# Patient Record
Sex: Female | Born: 1935
Health system: Southern US, Community
[De-identification: ages and names within clinical notes are randomized; demographics above are authoritative.]

## PROBLEM LIST (undated history)

## (undated) ENCOUNTER — Emergency Department (HOSPITAL_COMMUNITY): Payer: Medicare Other

## (undated) DIAGNOSIS — N200 Calculus of kidney: Secondary | ICD-10-CM

## (undated) DIAGNOSIS — D649 Anemia, unspecified: Secondary | ICD-10-CM

## (undated) DIAGNOSIS — M25569 Pain in unspecified knee: Secondary | ICD-10-CM

## (undated) DIAGNOSIS — I10 Essential (primary) hypertension: Secondary | ICD-10-CM

## (undated) DIAGNOSIS — M542 Cervicalgia: Secondary | ICD-10-CM

## (undated) DIAGNOSIS — R221 Localized swelling, mass and lump, neck: Secondary | ICD-10-CM

## (undated) DIAGNOSIS — I714 Abdominal aortic aneurysm, without rupture, unspecified: Secondary | ICD-10-CM

## (undated) DIAGNOSIS — C189 Malignant neoplasm of colon, unspecified: Secondary | ICD-10-CM

## (undated) DIAGNOSIS — E039 Hypothyroidism, unspecified: Secondary | ICD-10-CM

## (undated) DIAGNOSIS — K625 Hemorrhage of anus and rectum: Secondary | ICD-10-CM

## (undated) DIAGNOSIS — K219 Gastro-esophageal reflux disease without esophagitis: Secondary | ICD-10-CM

## (undated) DIAGNOSIS — N189 Chronic kidney disease, unspecified: Secondary | ICD-10-CM

## (undated) DIAGNOSIS — R42 Dizziness and giddiness: Secondary | ICD-10-CM

## (undated) DIAGNOSIS — I4891 Unspecified atrial fibrillation: Secondary | ICD-10-CM

## (undated) DIAGNOSIS — E162 Hypoglycemia, unspecified: Secondary | ICD-10-CM

## (undated) DIAGNOSIS — I5022 Chronic systolic (congestive) heart failure: Secondary | ICD-10-CM

## (undated) DIAGNOSIS — I498 Other specified cardiac arrhythmias: Secondary | ICD-10-CM

## (undated) DIAGNOSIS — E119 Type 2 diabetes mellitus without complications: Secondary | ICD-10-CM

## (undated) DIAGNOSIS — K565 Intestinal adhesions [bands], unspecified as to partial versus complete obstruction: Secondary | ICD-10-CM

## (undated) DIAGNOSIS — R809 Proteinuria, unspecified: Secondary | ICD-10-CM

## (undated) DIAGNOSIS — E878 Other disorders of electrolyte and fluid balance, not elsewhere classified: Secondary | ICD-10-CM

## (undated) DIAGNOSIS — R0602 Shortness of breath: Secondary | ICD-10-CM

## (undated) DIAGNOSIS — E7800 Pure hypercholesterolemia, unspecified: Secondary | ICD-10-CM

## (undated) DIAGNOSIS — I639 Cerebral infarction, unspecified: Secondary | ICD-10-CM

## (undated) DIAGNOSIS — I712 Thoracic aortic aneurysm, without rupture, unspecified: Secondary | ICD-10-CM

## (undated) DIAGNOSIS — F329 Major depressive disorder, single episode, unspecified: Secondary | ICD-10-CM

## (undated) DIAGNOSIS — M1990 Unspecified osteoarthritis, unspecified site: Secondary | ICD-10-CM

## (undated) DIAGNOSIS — K832 Perforation of bile duct: Secondary | ICD-10-CM

## (undated) DIAGNOSIS — R220 Localized swelling, mass and lump, head: Secondary | ICD-10-CM

## (undated) DIAGNOSIS — E876 Hypokalemia: Secondary | ICD-10-CM

## (undated) DIAGNOSIS — F419 Anxiety disorder, unspecified: Secondary | ICD-10-CM

## (undated) DIAGNOSIS — M109 Gout, unspecified: Secondary | ICD-10-CM

## (undated) DIAGNOSIS — F32A Depression, unspecified: Secondary | ICD-10-CM

## (undated) DIAGNOSIS — K8020 Calculus of gallbladder without cholecystitis without obstruction: Secondary | ICD-10-CM

## (undated) DIAGNOSIS — H1045 Other chronic allergic conjunctivitis: Secondary | ICD-10-CM

## (undated) HISTORY — DX: Cervicalgia: M54.2

## (undated) HISTORY — DX: Cerebral infarction, unspecified: I63.9

## (undated) HISTORY — DX: Major depressive disorder, single episode, unspecified: F32.9

## (undated) HISTORY — DX: Calculus of gallbladder without cholecystitis without obstruction: K80.20

## (undated) HISTORY — DX: Dizziness and giddiness: R42

## (undated) HISTORY — DX: Localized swelling, mass and lump, neck: R22.1

## (undated) HISTORY — DX: Shortness of breath: R06.02

## (undated) HISTORY — DX: Hypoglycemia, unspecified: E16.2

## (undated) HISTORY — DX: Chronic systolic (congestive) heart failure: I50.22

## (undated) HISTORY — DX: Perforation of bile duct: K83.2

## (undated) HISTORY — DX: Other chronic allergic conjunctivitis: H10.45

## (undated) HISTORY — DX: Pain in unspecified knee: M25.569

## (undated) HISTORY — DX: Thoracic aortic aneurysm, without rupture: I71.2

## (undated) HISTORY — DX: Gastro-esophageal reflux disease without esophagitis: K21.9

## (undated) HISTORY — DX: Hypokalemia: E87.6

## (undated) HISTORY — DX: Anxiety disorder, unspecified: F41.9

## (undated) HISTORY — DX: Thoracic aortic aneurysm, without rupture, unspecified: I71.20

## (undated) HISTORY — DX: Malignant neoplasm of colon, unspecified: C18.9

## (undated) HISTORY — DX: Depression, unspecified: F32.A

## (undated) HISTORY — DX: Other specified cardiac arrhythmias: I49.8

## (undated) HISTORY — DX: Localized swelling, mass and lump, head: R22.0

## (undated) HISTORY — DX: Proteinuria, unspecified: R80.9

## (undated) HISTORY — DX: Gout, unspecified: M10.9

## (undated) HISTORY — DX: Type 2 diabetes mellitus without complications: E11.9

## (undated) HISTORY — PX: KIDNEY STONE SURGERY: SHX686

## (undated) HISTORY — DX: Chronic kidney disease, unspecified: N18.9

## (undated) HISTORY — DX: Other disorders of electrolyte and fluid balance, not elsewhere classified: E87.8

## (undated) HISTORY — DX: Anemia, unspecified: D64.9

## (undated) HISTORY — DX: Hemorrhage of anus and rectum: K62.5

---

## 1997-07-23 DIAGNOSIS — C189 Malignant neoplasm of colon, unspecified: Secondary | ICD-10-CM

## 1997-07-23 HISTORY — DX: Malignant neoplasm of colon, unspecified: C18.9

## 1997-08-11 HISTORY — PX: HEMICOLECTOMY: SHX854

## 1998-06-29 ENCOUNTER — Encounter: Payer: Self-pay | Admitting: Gastroenterology

## 1998-06-29 ENCOUNTER — Ambulatory Visit (HOSPITAL_COMMUNITY): Admission: RE | Admit: 1998-06-29 | Discharge: 1998-06-29 | Payer: Self-pay | Admitting: Gastroenterology

## 1998-10-24 ENCOUNTER — Ambulatory Visit (HOSPITAL_COMMUNITY): Admission: RE | Admit: 1998-10-24 | Discharge: 1998-10-24 | Payer: Self-pay | Admitting: Gastroenterology

## 1999-02-22 ENCOUNTER — Ambulatory Visit (HOSPITAL_COMMUNITY): Admission: RE | Admit: 1999-02-22 | Discharge: 1999-02-22 | Payer: Self-pay | Admitting: Cardiology

## 1999-02-22 ENCOUNTER — Encounter: Payer: Self-pay | Admitting: Cardiology

## 1999-04-17 ENCOUNTER — Encounter: Admission: RE | Admit: 1999-04-17 | Discharge: 1999-07-16 | Payer: Self-pay | Admitting: Family Medicine

## 2000-07-18 ENCOUNTER — Encounter: Payer: Self-pay | Admitting: *Deleted

## 2000-07-18 ENCOUNTER — Ambulatory Visit (HOSPITAL_COMMUNITY): Admission: RE | Admit: 2000-07-18 | Discharge: 2000-07-18 | Payer: Self-pay | Admitting: *Deleted

## 2000-10-07 ENCOUNTER — Encounter: Admission: RE | Admit: 2000-10-07 | Discharge: 2001-01-05 | Payer: Self-pay | Admitting: Family Medicine

## 2001-02-12 ENCOUNTER — Encounter: Admission: RE | Admit: 2001-02-12 | Discharge: 2001-05-13 | Payer: Self-pay | Admitting: Family Medicine

## 2001-04-29 ENCOUNTER — Encounter (HOSPITAL_COMMUNITY): Payer: Self-pay | Admitting: Oncology

## 2001-04-29 ENCOUNTER — Ambulatory Visit (HOSPITAL_COMMUNITY): Admission: RE | Admit: 2001-04-29 | Discharge: 2001-04-29 | Payer: Self-pay | Admitting: Oncology

## 2001-06-21 ENCOUNTER — Inpatient Hospital Stay (HOSPITAL_COMMUNITY): Admission: EM | Admit: 2001-06-21 | Discharge: 2001-06-22 | Payer: Self-pay

## 2001-09-22 ENCOUNTER — Encounter: Payer: Self-pay | Admitting: Family Medicine

## 2001-09-22 ENCOUNTER — Encounter: Admission: RE | Admit: 2001-09-22 | Discharge: 2001-09-22 | Payer: Self-pay | Admitting: Family Medicine

## 2001-10-01 ENCOUNTER — Ambulatory Visit (HOSPITAL_COMMUNITY): Admission: RE | Admit: 2001-10-01 | Discharge: 2001-10-01 | Payer: Self-pay | Admitting: Family Medicine

## 2002-04-30 ENCOUNTER — Encounter: Admission: RE | Admit: 2002-04-30 | Discharge: 2002-04-30 | Payer: Self-pay | Admitting: Cardiothoracic Surgery

## 2002-04-30 ENCOUNTER — Encounter: Payer: Self-pay | Admitting: Cardiothoracic Surgery

## 2002-05-12 ENCOUNTER — Encounter (HOSPITAL_BASED_OUTPATIENT_CLINIC_OR_DEPARTMENT_OTHER): Admission: RE | Admit: 2002-05-12 | Discharge: 2002-08-10 | Payer: Self-pay | Admitting: Internal Medicine

## 2002-07-27 ENCOUNTER — Encounter: Admission: RE | Admit: 2002-07-27 | Discharge: 2002-10-25 | Payer: Self-pay | Admitting: Family Medicine

## 2002-11-24 ENCOUNTER — Encounter: Admission: RE | Admit: 2002-11-24 | Discharge: 2003-02-22 | Payer: Self-pay | Admitting: Family Medicine

## 2003-04-29 ENCOUNTER — Encounter: Admission: RE | Admit: 2003-04-29 | Discharge: 2003-04-29 | Payer: Self-pay | Admitting: Cardiothoracic Surgery

## 2003-04-29 ENCOUNTER — Encounter: Payer: Self-pay | Admitting: Cardiothoracic Surgery

## 2003-05-11 ENCOUNTER — Encounter: Payer: Self-pay | Admitting: Family Medicine

## 2003-05-11 ENCOUNTER — Encounter: Admission: RE | Admit: 2003-05-11 | Discharge: 2003-05-11 | Payer: Self-pay | Admitting: Family Medicine

## 2003-09-15 ENCOUNTER — Encounter: Admission: RE | Admit: 2003-09-15 | Discharge: 2003-09-15 | Payer: Self-pay | Admitting: Family Medicine

## 2003-12-28 ENCOUNTER — Emergency Department (HOSPITAL_COMMUNITY): Admission: EM | Admit: 2003-12-28 | Discharge: 2003-12-28 | Payer: Self-pay | Admitting: Family Medicine

## 2004-05-04 ENCOUNTER — Encounter: Admission: RE | Admit: 2004-05-04 | Discharge: 2004-05-04 | Payer: Self-pay | Admitting: Cardiothoracic Surgery

## 2004-07-21 ENCOUNTER — Inpatient Hospital Stay (HOSPITAL_COMMUNITY): Admission: EM | Admit: 2004-07-21 | Discharge: 2004-07-22 | Payer: Self-pay | Admitting: Emergency Medicine

## 2004-10-30 ENCOUNTER — Encounter: Admission: RE | Admit: 2004-10-30 | Discharge: 2004-10-30 | Payer: Self-pay | Admitting: Family Medicine

## 2005-03-16 ENCOUNTER — Ambulatory Visit: Payer: Self-pay | Admitting: Internal Medicine

## 2005-03-19 ENCOUNTER — Ambulatory Visit: Payer: Self-pay | Admitting: Cardiology

## 2005-03-19 IMAGING — CT CT CHEST W/ CM
1 of 5 series · 15 of 30 positions shown, 19 images · IV contrast (OMNIPAQUE 100)
Comparison: none

FINDINGS
CLINICAL DATA: FOLLOW UP ASCENDING THORACIC AORTIC ANEURYSM.  (CON: NONE)
CT OF CHEST WITH CONTRAST:
MULTIDETECTOR HELICAL SCANS THROUGH THE CHEST WERE PERFORMED AFTER IV CONTRAST MEDIA WAS GIVEN.
100 CC OF OMNIPAQUE 300 WERE GIVEN AS THE CONTRAST MEDIA.  THIS SCAN IS COMPARED TO THE PRIOR CT OF
04/30/02.
THERE IS LITTLE CHANGE IN THE FUSIFORM DILATATION OF THE ASCENDING AORTA.  ON IMAGE NUMBER 89, THE
ASCENDING AORTIC FUSIFORMLY DILATED ANEURYSM MEASURES 4.7 X 4.9 CM COMPARED TO 4.6 X 4.8 CM ON THE
PRIOR STUDY.  THE ORIGIN OF THE RIGHT SUBCLAVIAN ARTERY IS ALSO PROMINENT AS IT EMANATES FROM THE
AORTIC ARCH, AND THIS IS UNCHANGED AS WELL.  THE ORIGINS OF THE LEFT SUBCLAVIAN AND LEFT COMMON
CAROTID APPEAR NORMAL.  THE PULMONARY ARTERIES OPACIFY WELL WITH NO ABNORMALITY.  NO MEDIASTINAL OR
HILAR ADENOPATHY IS SEEN, WITH A FEW SMALL PRECARINAL NODES REMAINING STABLE.  INCIDENTAL SMALL
GALLSTONES AGAIN ARE NOTED LAYERING IN THE GALLBLADDER.
ON LUNG WINDOW IMAGES, NO LUNG PARENCHYMAL LESION IS SEEN.  NO EFFUSION IS NOTED.
IMPRESSION
STABLE FUSIFORM DILATATION OF ASCENDING AORTA CURRENTLY MEASURING 4.7 X 4.9 CM COMPARED TO 4.6 X
4.8 CM ON THE STUDY OF 04/30/02.  NO CHANGE IN DILATED ORIGIN OF THE RIGHT SUBCLAVIAN ARTERY AS
WELL.  NO MEDIASTINAL OR HILAR ADENOPATHY IS SEEN.
CT MULTIPLANAR RECONSTRUCTION
MULTIPLANAR REFORMATTED CT IMAGES WERE RECONSTRUCTED FROM THE AXIAL CT DATA SET.  THESE IMAGES WERE
REVIEWED, AND PERTINENT FINDINGS ARE INCLUDED IN THE ACCOMPANYING COMPLETE CT REPORT.
SEE COMPLETE CT REPORT.

[Series 4: recon 3: · axial · 0.70mm/px · z∈[-246,+17]mm · 15 of 245 slices shown, 19 images]
[im 17/245  mediastinal]
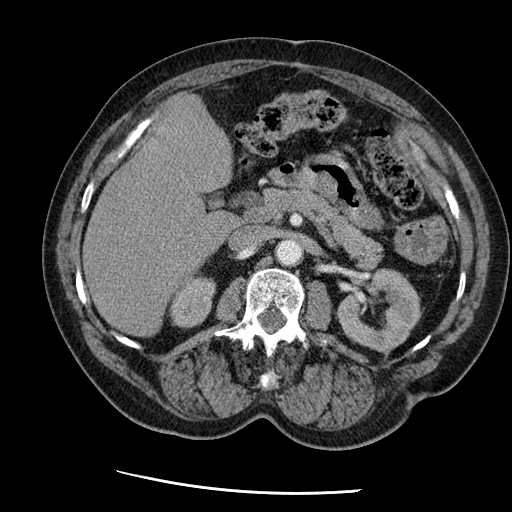
[im 17/245  lung]
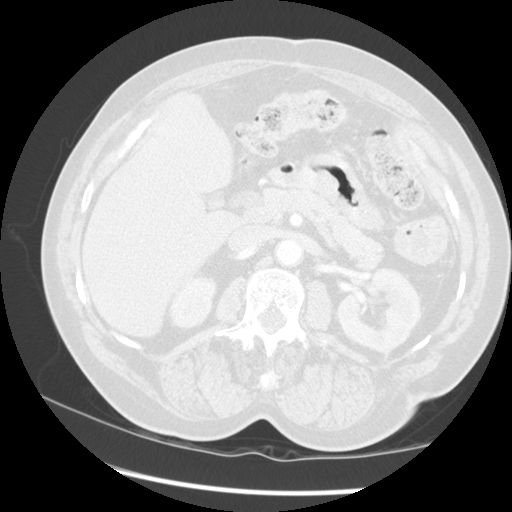
[im 33/245  lung]
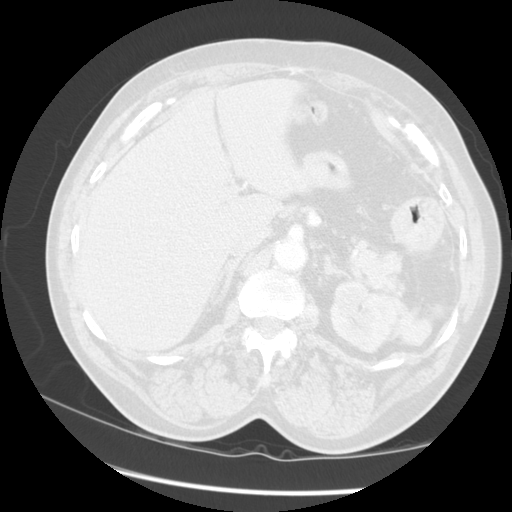
[im 49/245  lung]
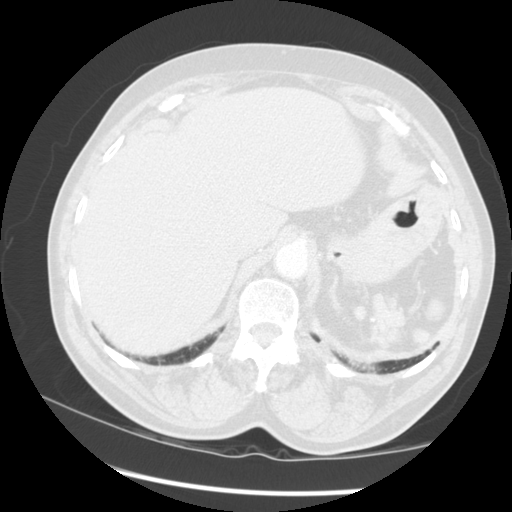
[im 66/245  lung]
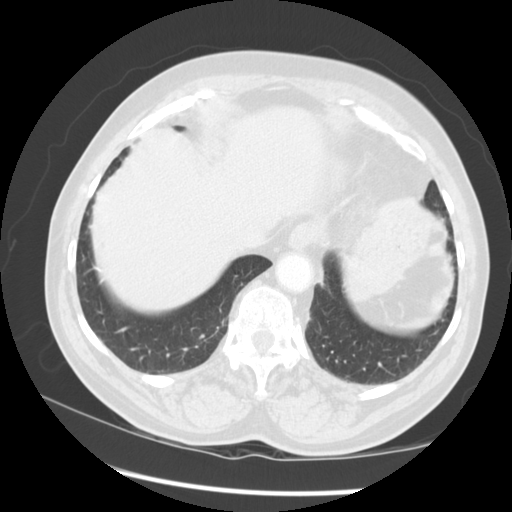
[im 82/245  mediastinal]
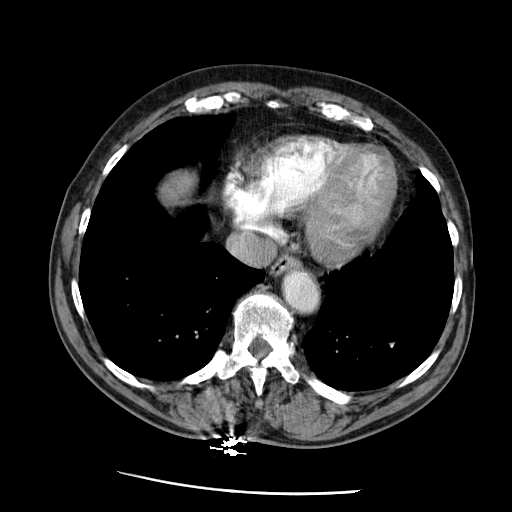
[im 82/245  lung]
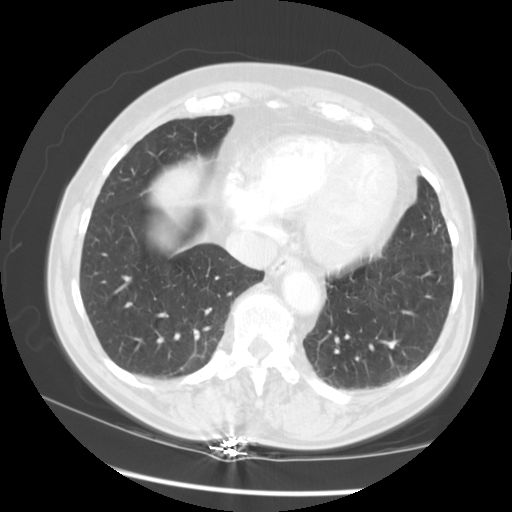
[im 98/245  lung]
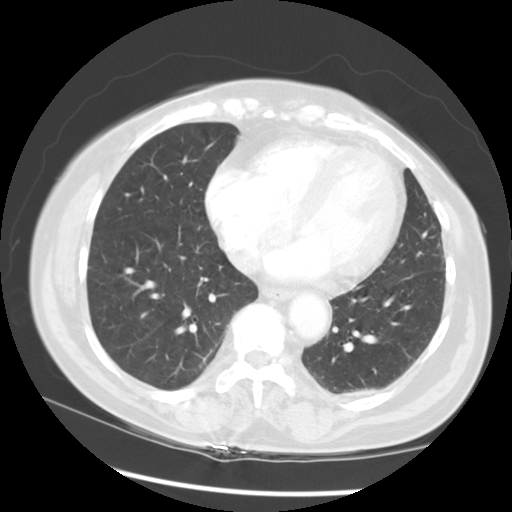
[im 114/245  lung]
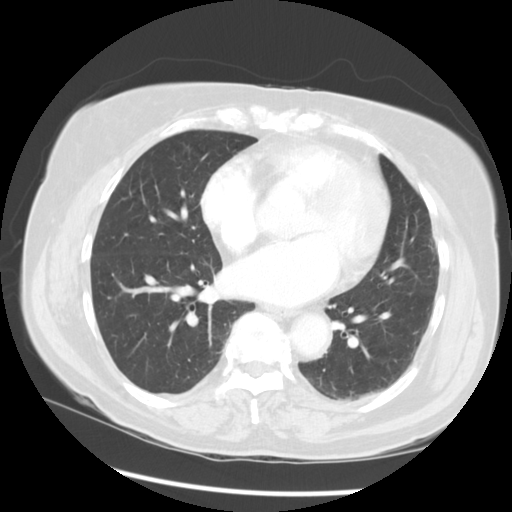
[im 131/245  lung]
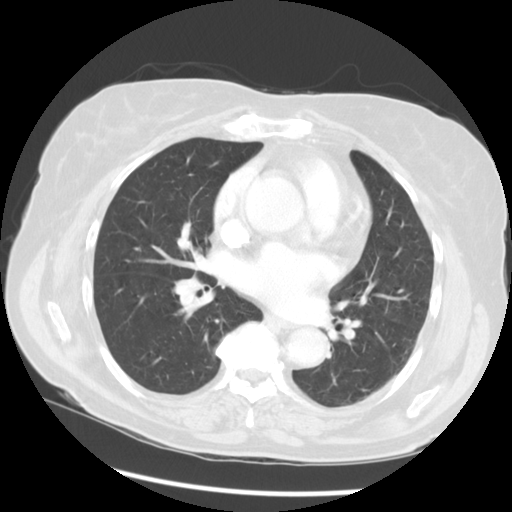
[im 139/245  mediastinal]
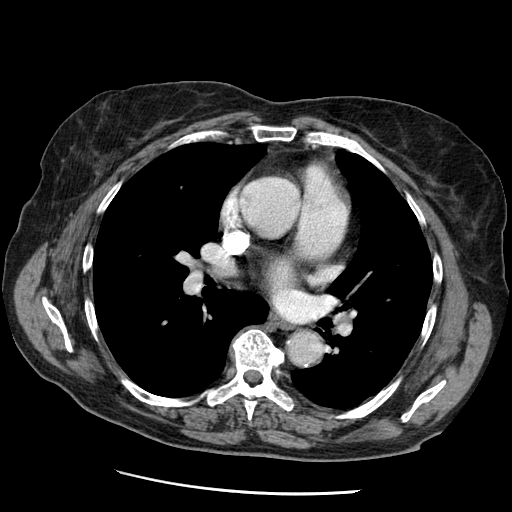
[im 139/245  lung]
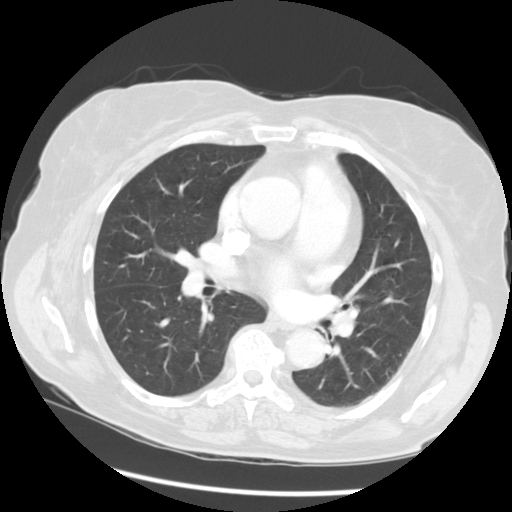
[im 147/245  lung]
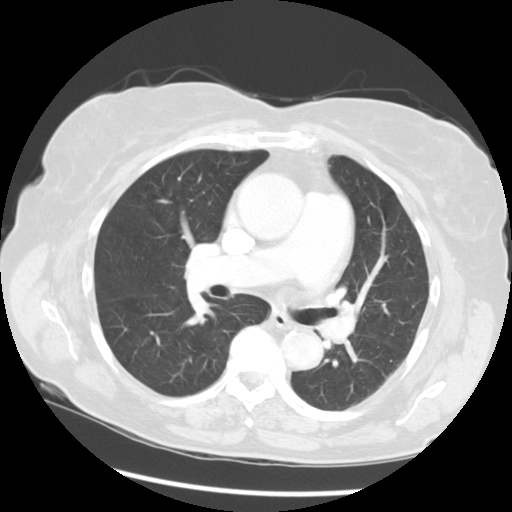
[im 163/245  lung]
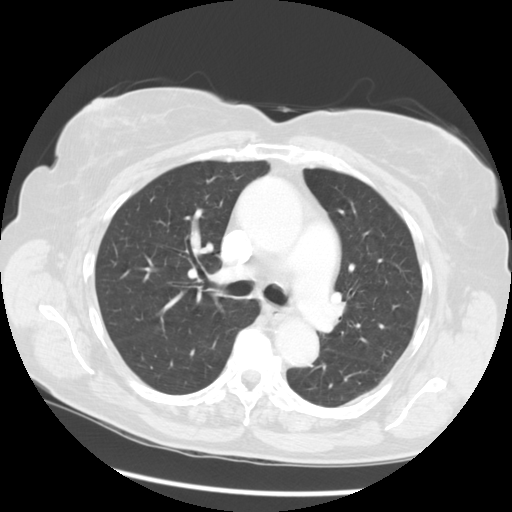
[im 179/245  lung]
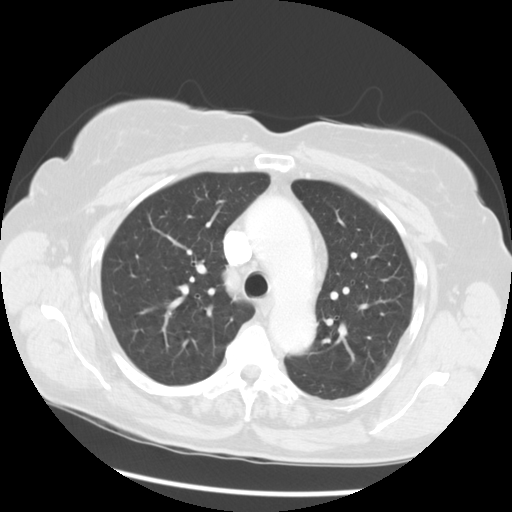
[im 196/245  mediastinal]
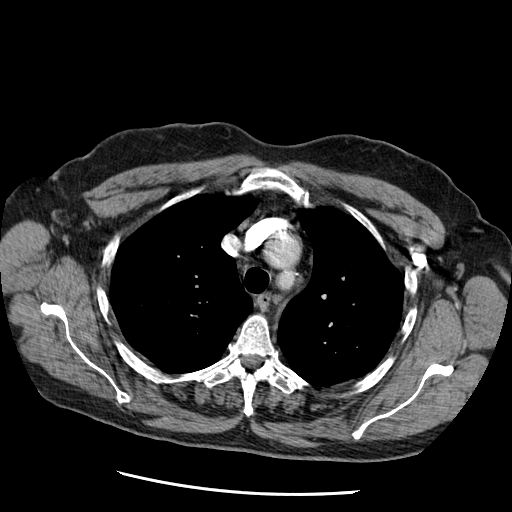
[im 196/245  lung]
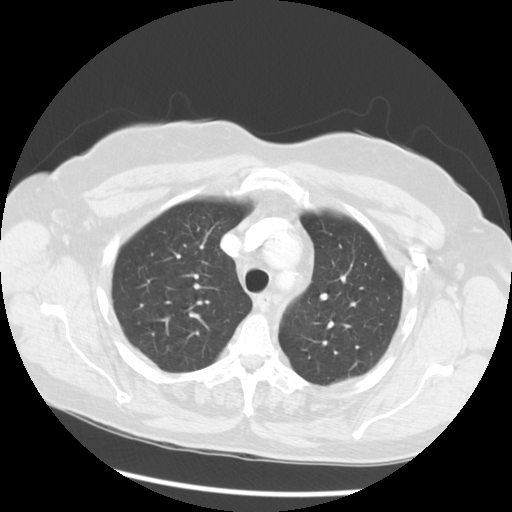
[im 212/245  lung]
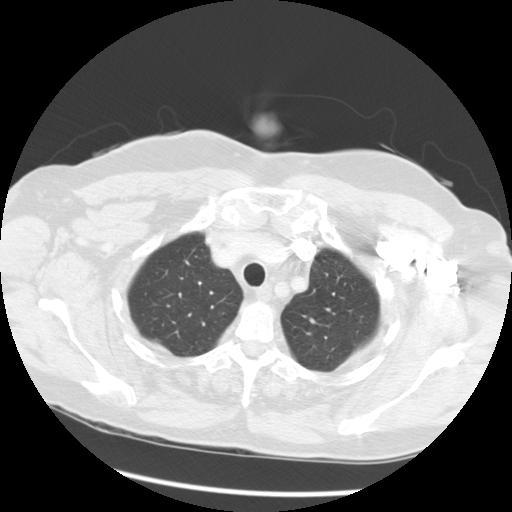
[im 228/245  lung]
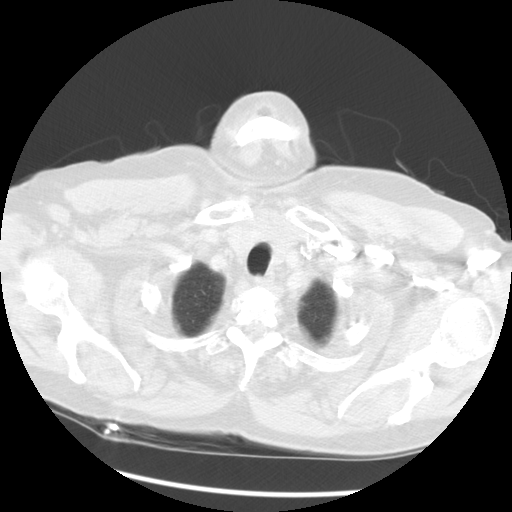

[15 of 30 positions shown; findings below may reference images not displayed]

## 2005-03-31 IMAGING — CT CT HEAD WO/W CM
3 of 6 series · 15 of 30 positions shown, 17 images · IV contrast (OMNI 100/300)
Comparison: none

FINDINGS
CLINICAL DATA: HEADACHES, NUMBNESS LEFT SIDE OCCIPITAL REGION.  POSTERIOR CERVICAL CHAIN LYMPH
NODE.  CON-NONE.
CT OF THE NECK WITH CONTRAST:
MULTIDETECTOR HELICAL SCANS THROUGH THE NECK WERE PERFORMED AFTER IV CONTRAST MEDIA WAS GIVEN.  100
CC OF OMNIPAQUE 300 WERE GIVEN AS THE CONTRAST MEDIA.
THE PAROTID GLANDS ARE SYMMETRICAL.  THE POSTERIOR ORO-NASOPHARYNX APPEARS NORMAL.  AT THE SIDE OF
THE PALPABLE ABNORMALITY THERE IS APPARENT LIPOMA PRESENT WITHIN THE POSTERIOR CERVICAL
MUSCULATURE.  NO ADENOPATHY IS SEEN.  THE CAROTID AND JUGULAR VEINS OPACIFY NORMALLY.  THE LEVEL OF
THE TWO CORDS IS NORMAL WITH NORMAL PARALARYNGEAL FAT PLANES IN THE SUBGLOTTIC AIRWAY IS NORMAL.
THE THYROID GLAND IS NORMAL IN SIZE.

[Series 4: neck · axial · 0.39mm/px · z∈[-32,+155]mm · 6 of 71 slices shown, 8 images]
[im 11/71  brain]
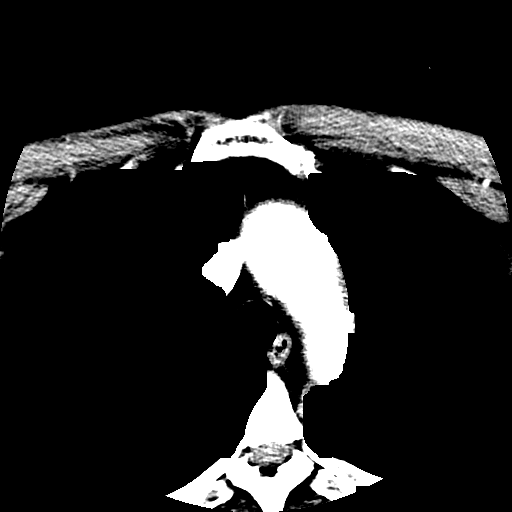
[im 11/71  bone]
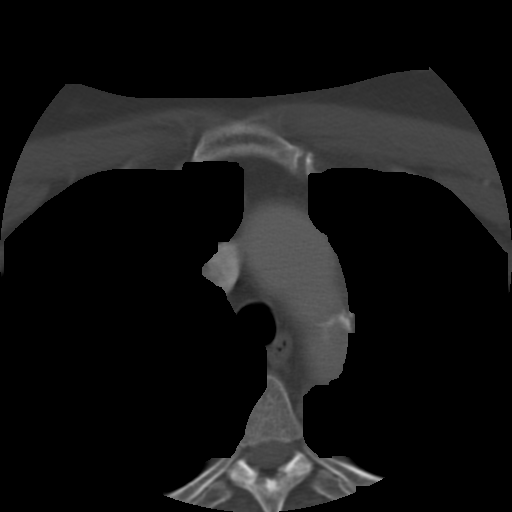
[im 21/71  bone]
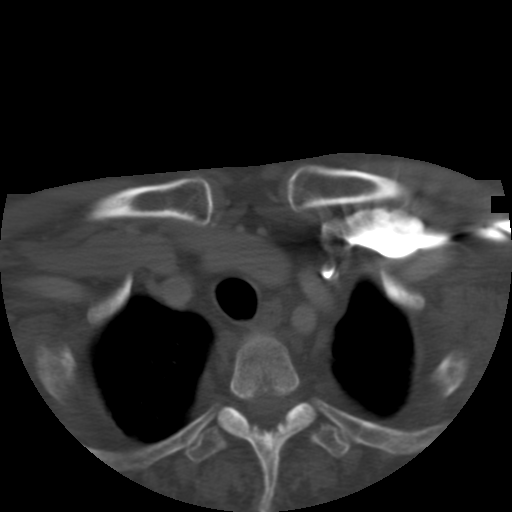
[im 31/71  bone]
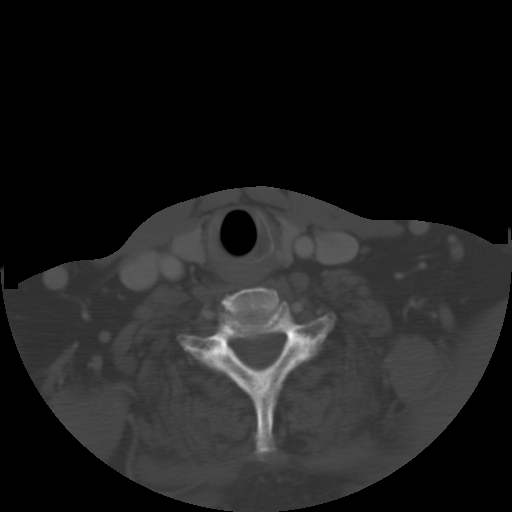
[im 41/71  bone]
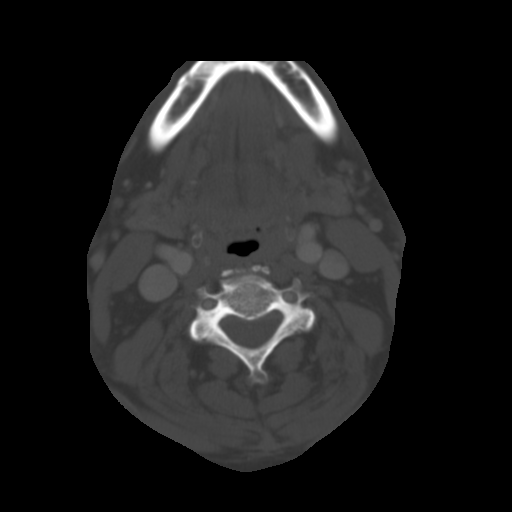
[im 51/71  brain]
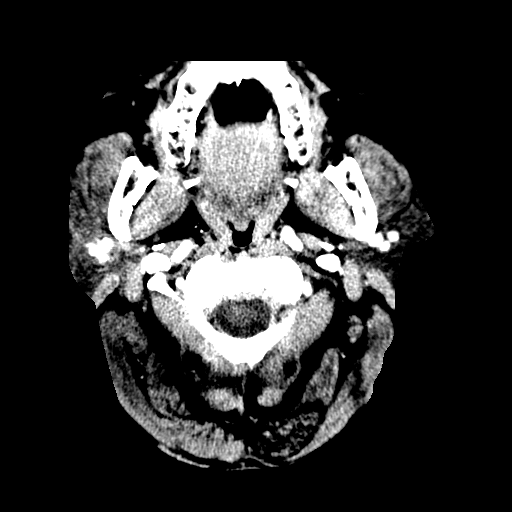
[im 51/71  bone]
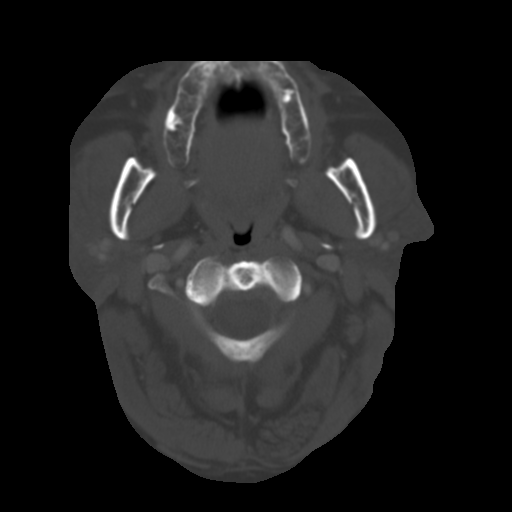
[im 61/71  bone]
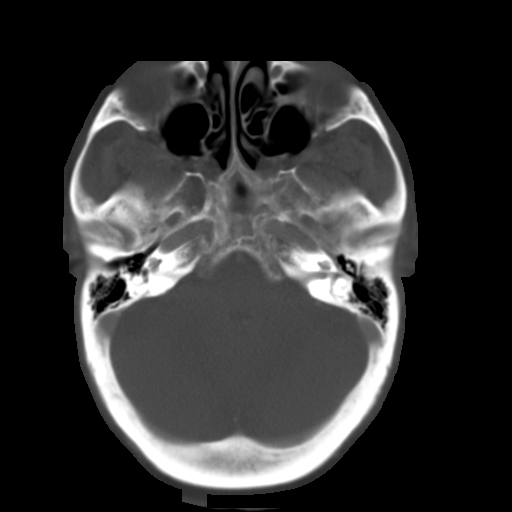

[Series 5: recon 2: neck · axial · 0.59mm/px · z∈[-32,+155]mm · 6 of 71 slices shown]
[im 11/71  bone]
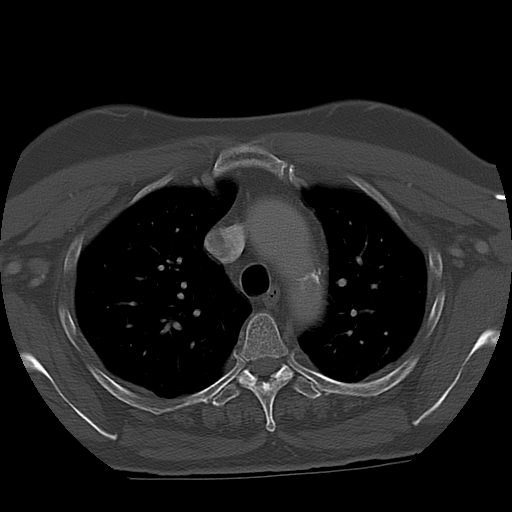
[im 21/71  bone]
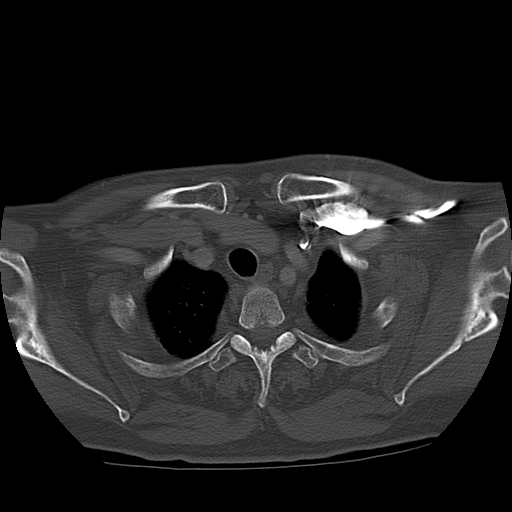
[im 31/71  bone]
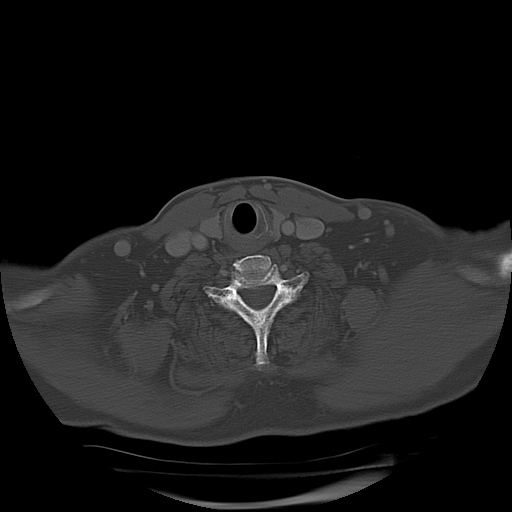
[im 41/71  bone]
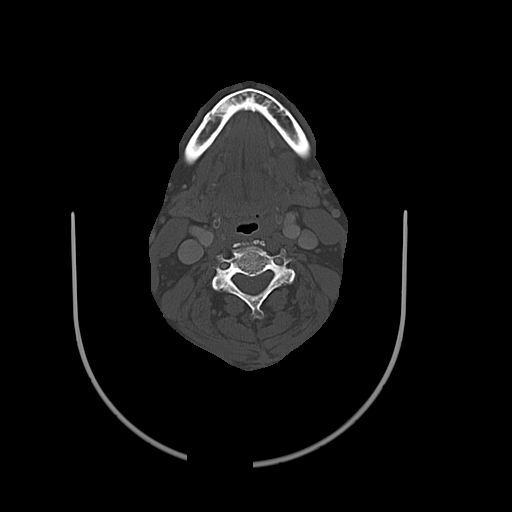
[im 51/71  bone]
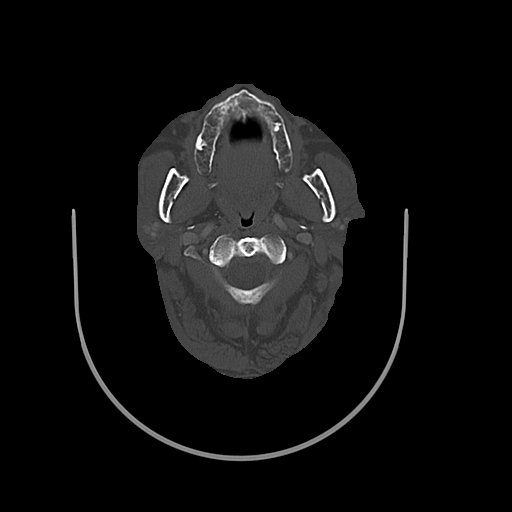
[im 61/71  bone]
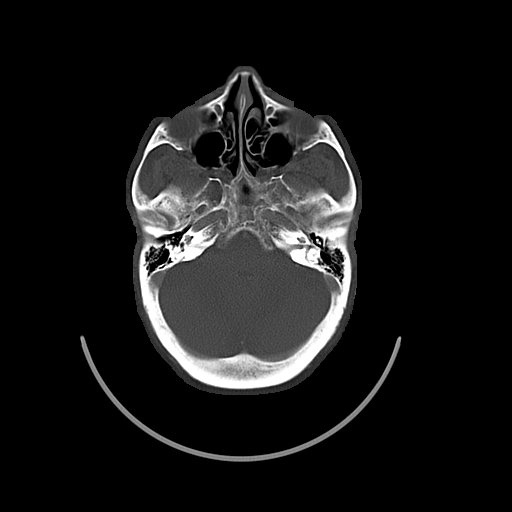

[Series 11: sinus prone · axial · 0.33mm/px · z∈[+32,+92]mm · 3 of 44 slices shown]
[im 11/44  bone]
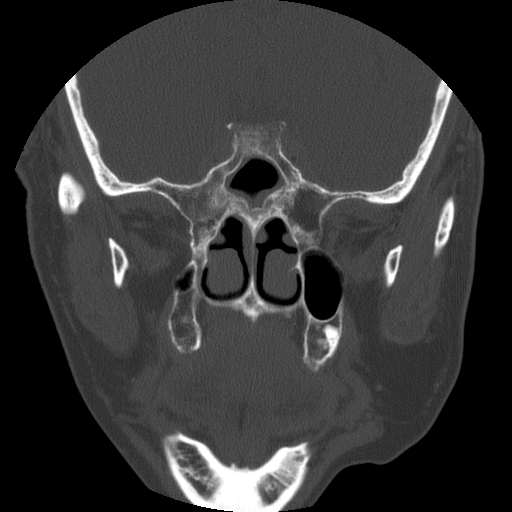
[im 22/44  bone]
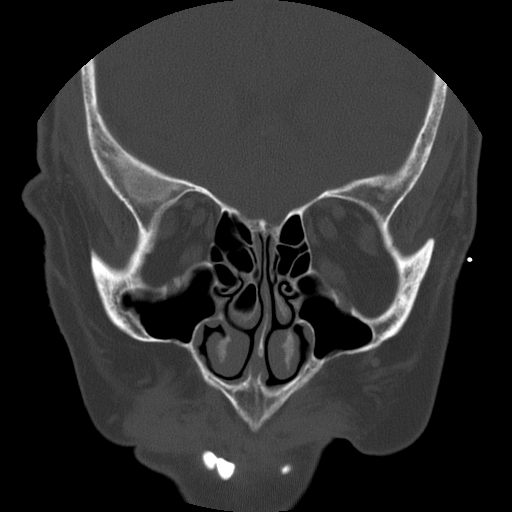
[im 33/44  bone]
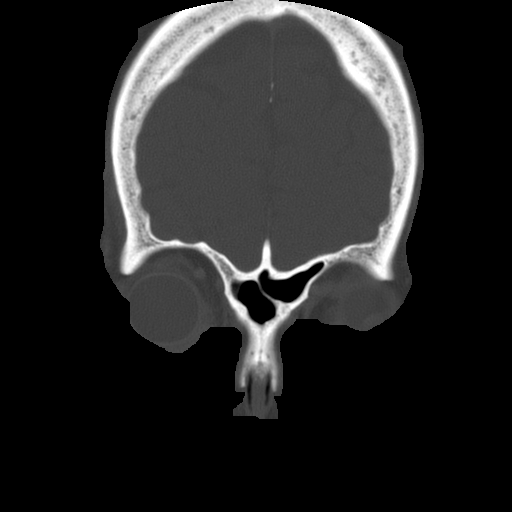

[15 of 30 positions shown; findings below may reference images not displayed]

IMPRESSION: 1)  THE AREA OF QUESTION IN THE POSTERIOR CERVICAL REGION APPEARS TO REPRESENT LIPOMA ON CT
MEASURING 13 X 21 MM ON IMAGE 27.  NO ADENOPATHY IS SEEN.
2)  THERE IS MILD DEGENERATIVE CHANGE PARTICULARLY AT C4-5 AND C5-6 WHERE THERE IS SOME FORAMINAL
NARROWING PRESENT.
CT OF THE SINUSES WITH CONTRAST:
MULTIDETECTOR HELICAL SCANS THROUGH THE PARANASAL SINUSES WERE PERFORMED IN BOTH THE CORONAL AND
AXIAL PLANES.  THERE IS NODULAR MUCOSAL THICKENING THROUGHOUT THE SPHENOID SINUS CONSISTENT WITH
SPHENOID SINUS DISEASE.  NO PRESENT EVIDENCE OF SINUSITIS IS SEEN.  THE REMAINDER OF THE PARANASAL
SINUSES ARE WELL PNEUMATIZED.  THERE IS PNEUMATIZATION OF THE LEFT MIDDLE TURBINATE.  THE
TURBINATES ARE NORMAL IN SIZE AND POSITION AND THE NASAL AIRWAY IS PATENT.  THE OSTIOMEATAL
COMPLEXES ARE PATENT BILATERALLY.  NO BONY EXPANSION OR EROSION IS SEEN.
IMPRESSION: SPHENOID SINUS DISEASE.  NO PRESENT EVIDENCE OF SINUSITIS.
CT OF THE BRAIN WITH CONTRAST:
AXIAL SCANS FROM THE BASE OF THE VERTEX WERE PERFORMED BEFORE AND AFTER IV CONTRAST MEDIA WAS
GIVEN.  100 CC OF OMNIPAQUE 300 WERE GIVEN AS THE CONTRAST MEDIA.  THE VENTRICULAR SYSTEM IN NORMAL
IN SIZE AND CONFIGURATION AND THE SEPTUM IS IN A NORMAL MIDLINE POSITION.  MINIMAL SMALL VESSEL
ISCHEMIC CHANGE IS NOTED IN THE PERIVENTRICULAR WHITE MATTER.  MILD CORTICAL ATROPHY IS PRESENT.
NO MASS EFFECT IS SEEN.  AFTER CONTRAST ADMINISTRATION, NO ENHANCING LESION IS SEEN.  ON BONE
WINDOW IMAGES NO BONY ABNORMALITY IS NOTED.
IMPRESSION
MILD ATROPHY AND MILD CHANGE OF THE SMALL VESSEL ISCHEMIC DISEASE.  NO ACUTE INTRACRANIAL
ABNORMALITY.

## 2005-05-08 ENCOUNTER — Ambulatory Visit: Payer: Self-pay | Admitting: Internal Medicine

## 2005-05-28 ENCOUNTER — Ambulatory Visit: Payer: Self-pay | Admitting: Pulmonary Disease

## 2005-08-21 ENCOUNTER — Other Ambulatory Visit: Admission: RE | Admit: 2005-08-21 | Discharge: 2005-08-21 | Payer: Self-pay | Admitting: Family Medicine

## 2006-02-12 ENCOUNTER — Emergency Department (HOSPITAL_COMMUNITY): Admission: EM | Admit: 2006-02-12 | Discharge: 2006-02-12 | Payer: Self-pay | Admitting: Family Medicine

## 2006-02-25 ENCOUNTER — Ambulatory Visit: Payer: Self-pay | Admitting: Oncology

## 2006-03-25 IMAGING — CT CT ANGIO CHEST
1 of 7 series · 15 of 30 positions shown · IV contrast (75ML OMNI 300)
Comparison: none

CLINICAL DATA: Follow up thoracic aortic aneurysm.  Diabetic.  Pain left upper chest.  History of colon cancer. 
 CT ANGIO CHEST: 
 CT angio chest protocol pre and post IV injection of 75cc of Omnipaque 300 with parasagittal and paracoronal CT multiplanar reconstructions are compared with prior study of 04/29/03.  Pre-IV contrast enhanced images show no hyperdensity of acute hemorrhage.  Following intravenous contrast enhanced images stable fusiform dilatation of ascending aorta measures 4.7cm AP x 4.8cm wide at similar level (prior image 89 ? current image 23).  Great vessels are stable in size and patentcy.  Interval slight cardiomegaly is seen with larger inferior vena cava and hepatic vessel caliber.  No new mediastinal, hilar, axillary mass/adenopathy is seen.  Lungs remain clear.  Non obstructing small calcified gallstones are stable with the upper abdominal organs otherwise unremarkable.

[Series 7: recon 3: chest w/ · axial · 0.62mm/px · z∈[-382,-76]mm · 15 of 281 slices shown]
[im 18/281  lung]
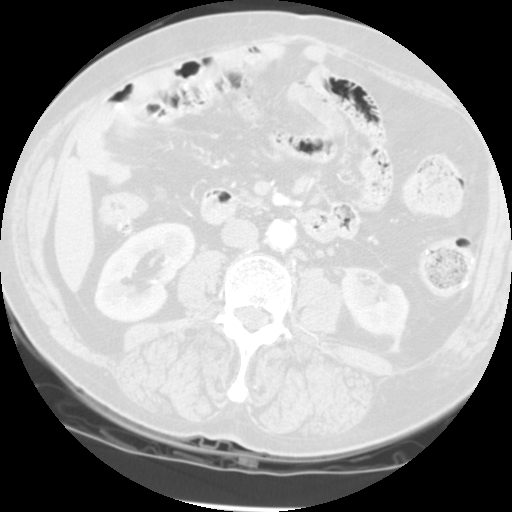
[im 36/281  mediastinal]
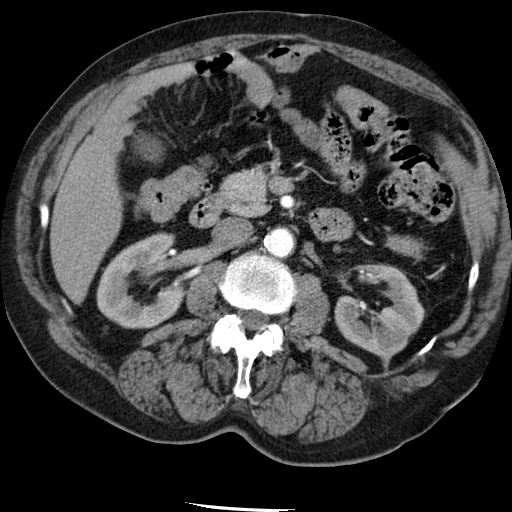
[im 53/281  lung]
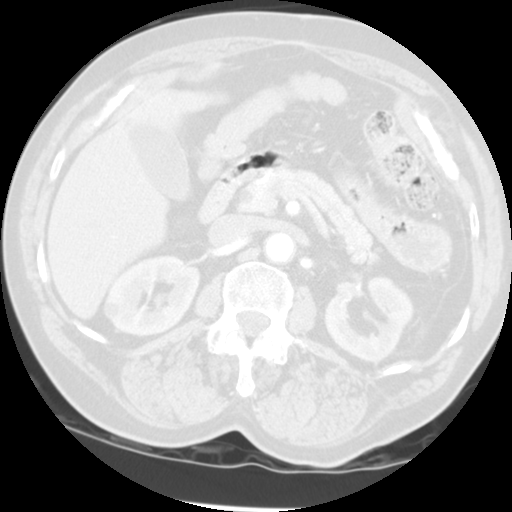
[im 71/281  mediastinal]
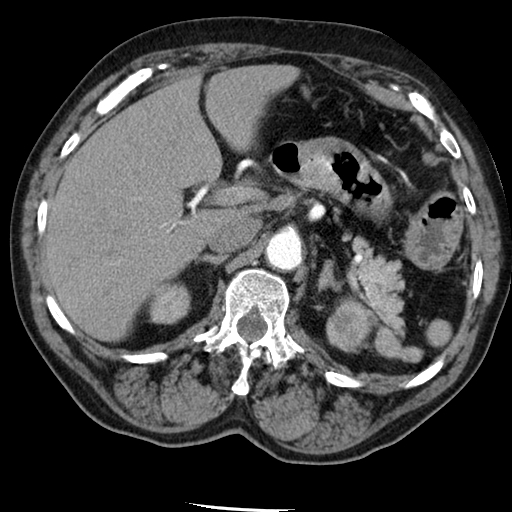
[im 88/281  lung]
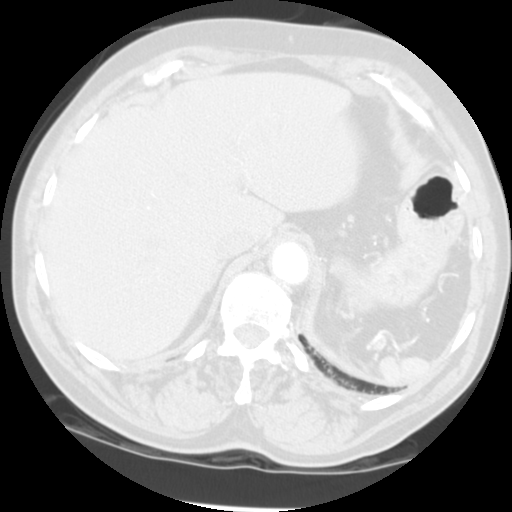
[im 106/281  mediastinal]
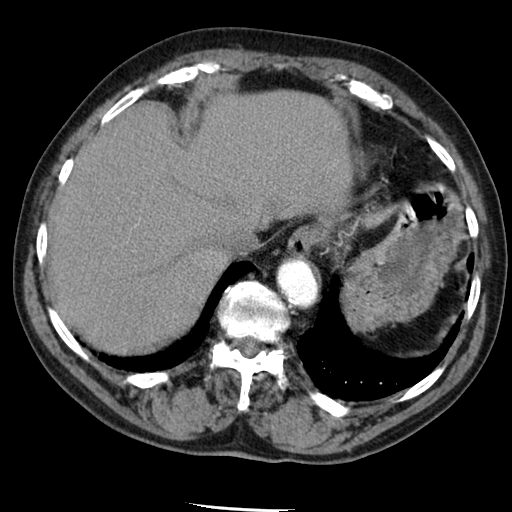
[im 123/281  lung]
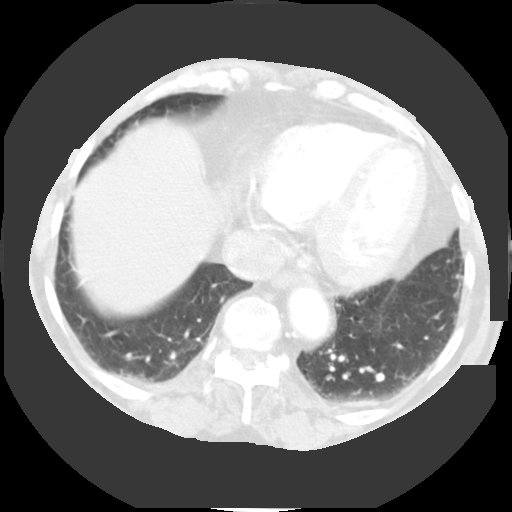
[im 141/281  mediastinal]
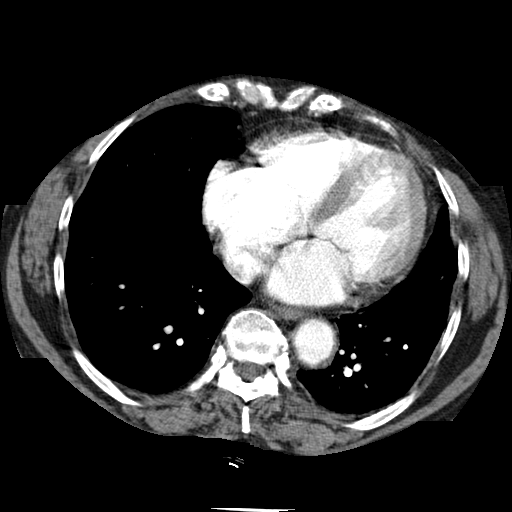
[im 158/281  lung]
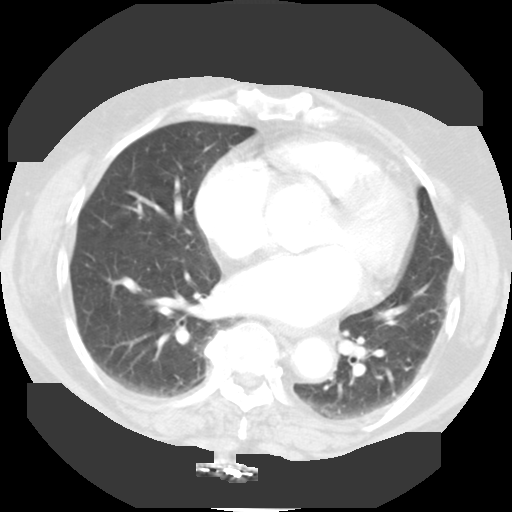
[im 176/281  mediastinal]
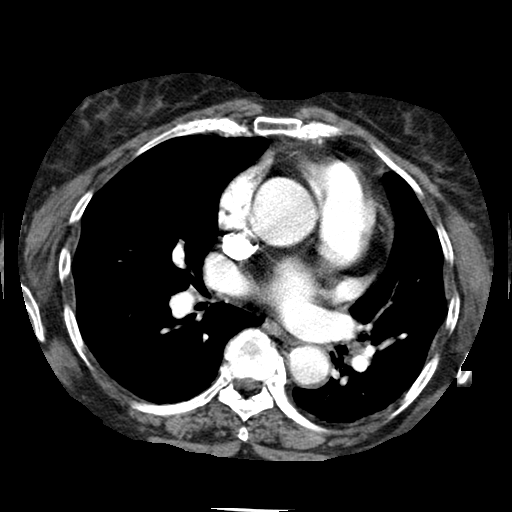
[im 193/281  lung]
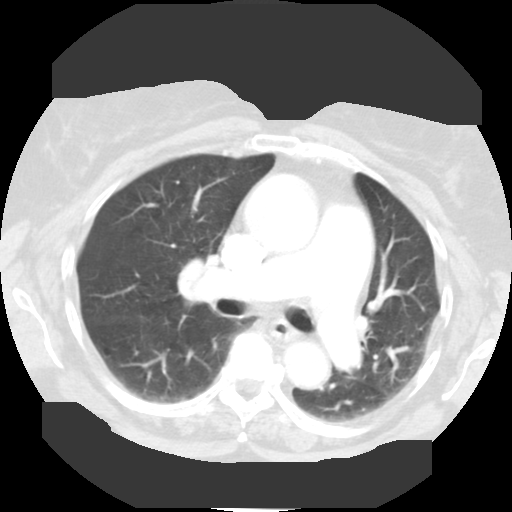
[im 211/281  mediastinal]
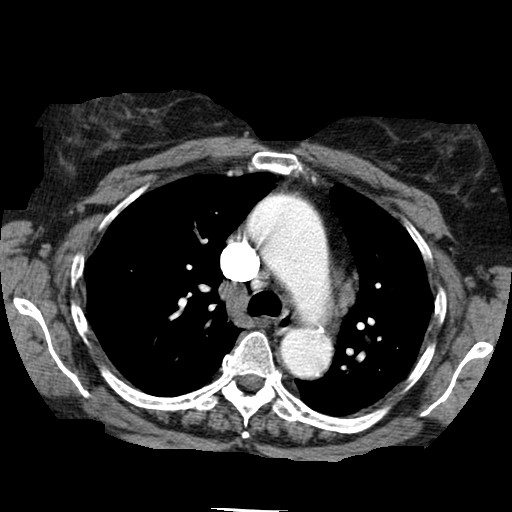
[im 228/281  lung]
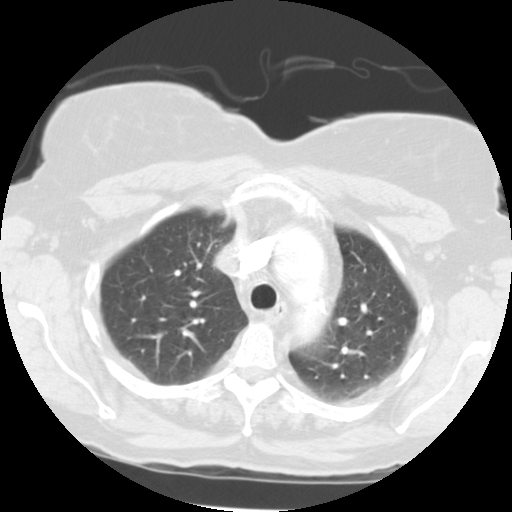
[im 246/281  mediastinal]
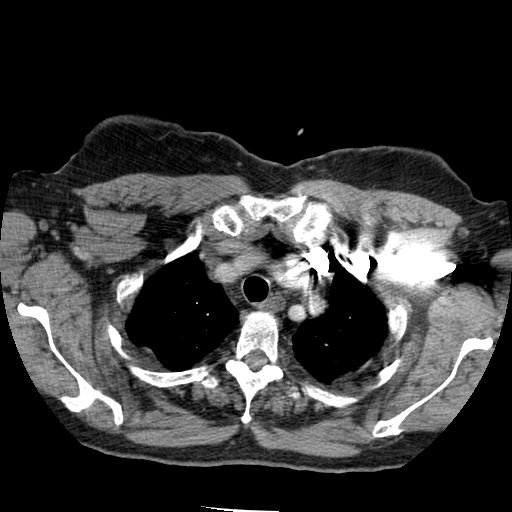
[im 263/281  lung]
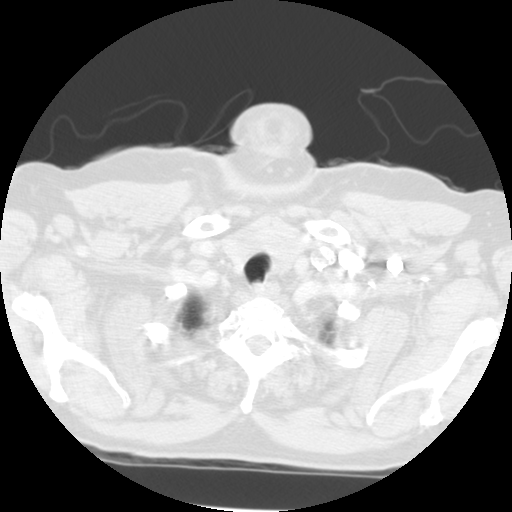

[15 of 30 positions shown; findings below may reference images not displayed]

IMPRESSION: Since 04/29/03: 
 [DATE].  Interval slight cardiomegaly and probable mild elevated right heart pressure. 
 2.  Stable fusiform ascending thoracic aortic aneurysm. 
 3.  Stable non obstructing gallstones. 
 CT MULTIPLANAR RECONSTRUCTION: 
 From source axial images paracoronal and parasagittal CT reformatted images demonstrate similar findings as described in axial CT report 05/04/04 with no interval change since 04/29/03 study.

## 2006-04-24 ENCOUNTER — Inpatient Hospital Stay (HOSPITAL_COMMUNITY): Admission: EM | Admit: 2006-04-24 | Discharge: 2006-04-29 | Payer: Self-pay | Admitting: Emergency Medicine

## 2006-04-25 ENCOUNTER — Encounter (INDEPENDENT_AMBULATORY_CARE_PROVIDER_SITE_OTHER): Payer: Self-pay | Admitting: Cardiology

## 2006-06-10 IMAGING — CR DG ABDOMEN ACUTE W/ 1V CHEST
3 series · 3 of 3 positions shown · non-contrast
Comparison: none

CLINICAL DATA: Abdominal pain and vomiting.
 ACUTE ABDOMINAL SERIES WITH PA CHEST:
 There is chronic cardiomegaly with chronic prominence of the main pulmonary arteries.  Lungs are clear.  There is no free air or appreciable free fluid in the abdomen. The bowel gas pattern is normal with a small amount of air scattered throughout the nondistended colon. No dilated loops of small bowel. No significant bony abnormality.

[view not recorded (1 of 3)]
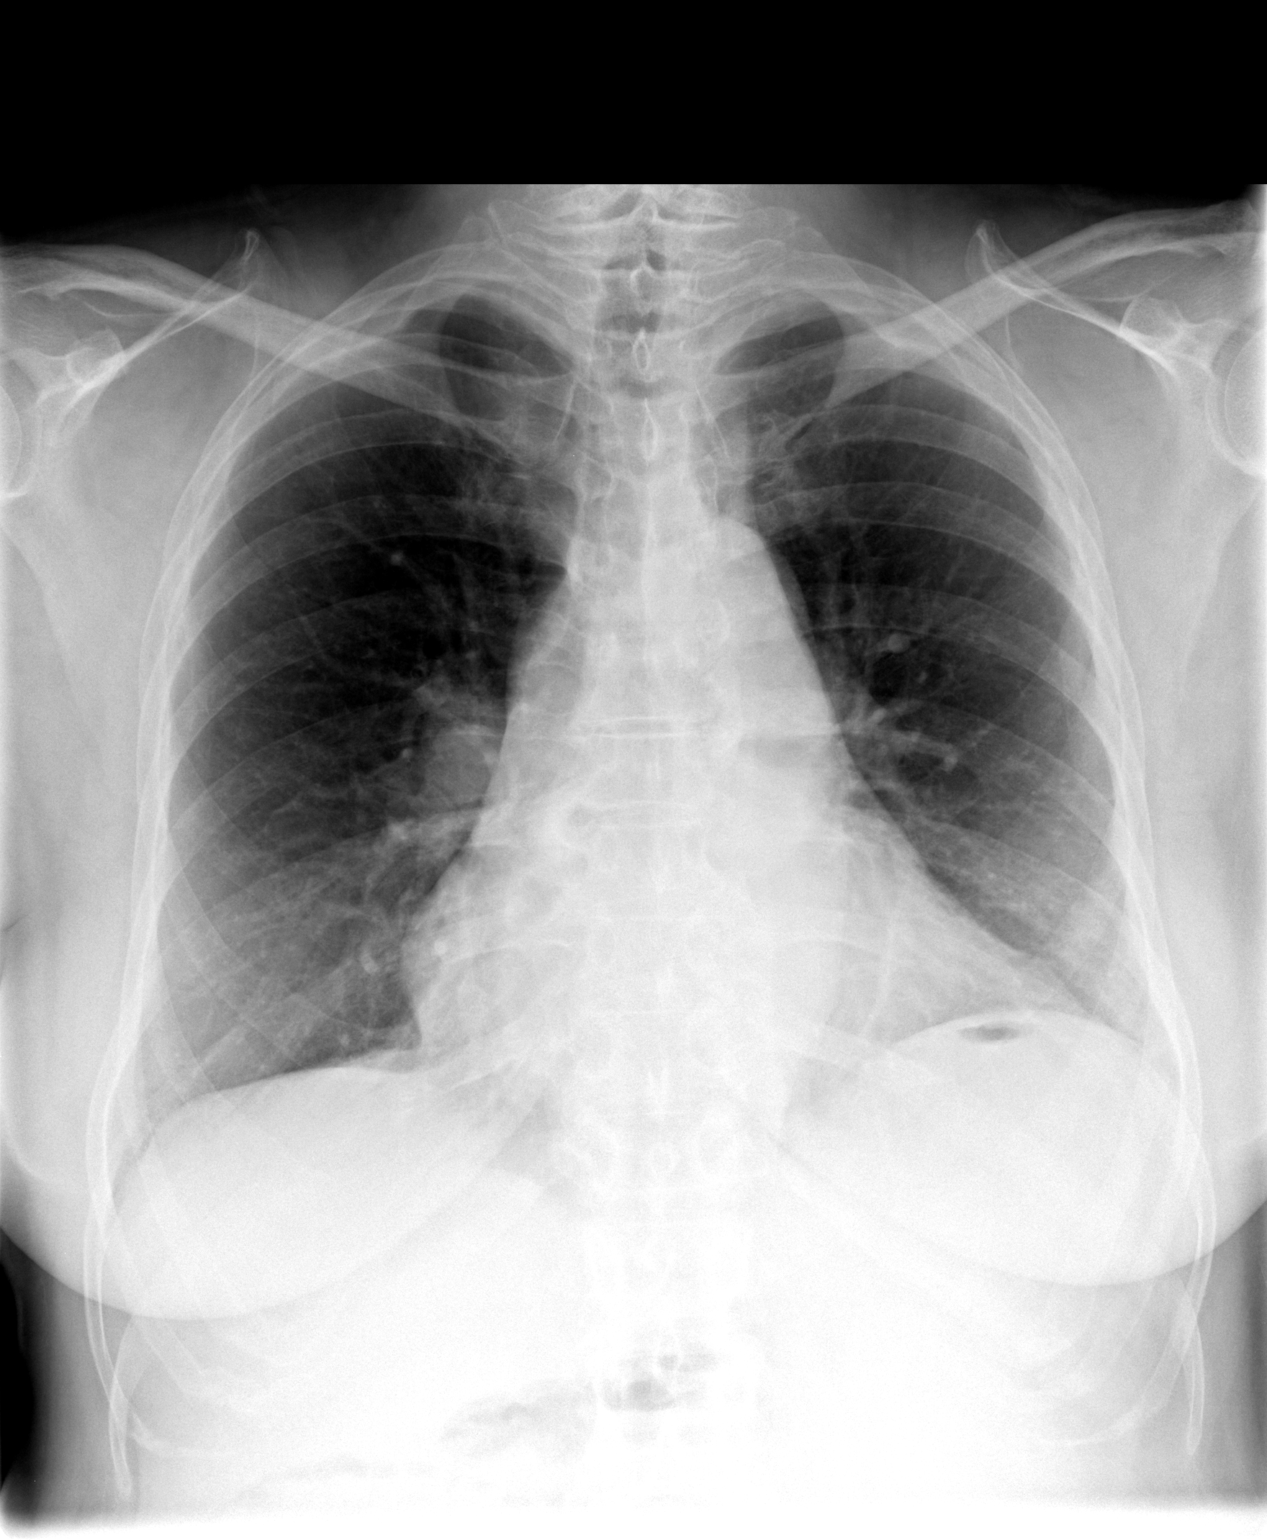

[view not recorded (2 of 3)]
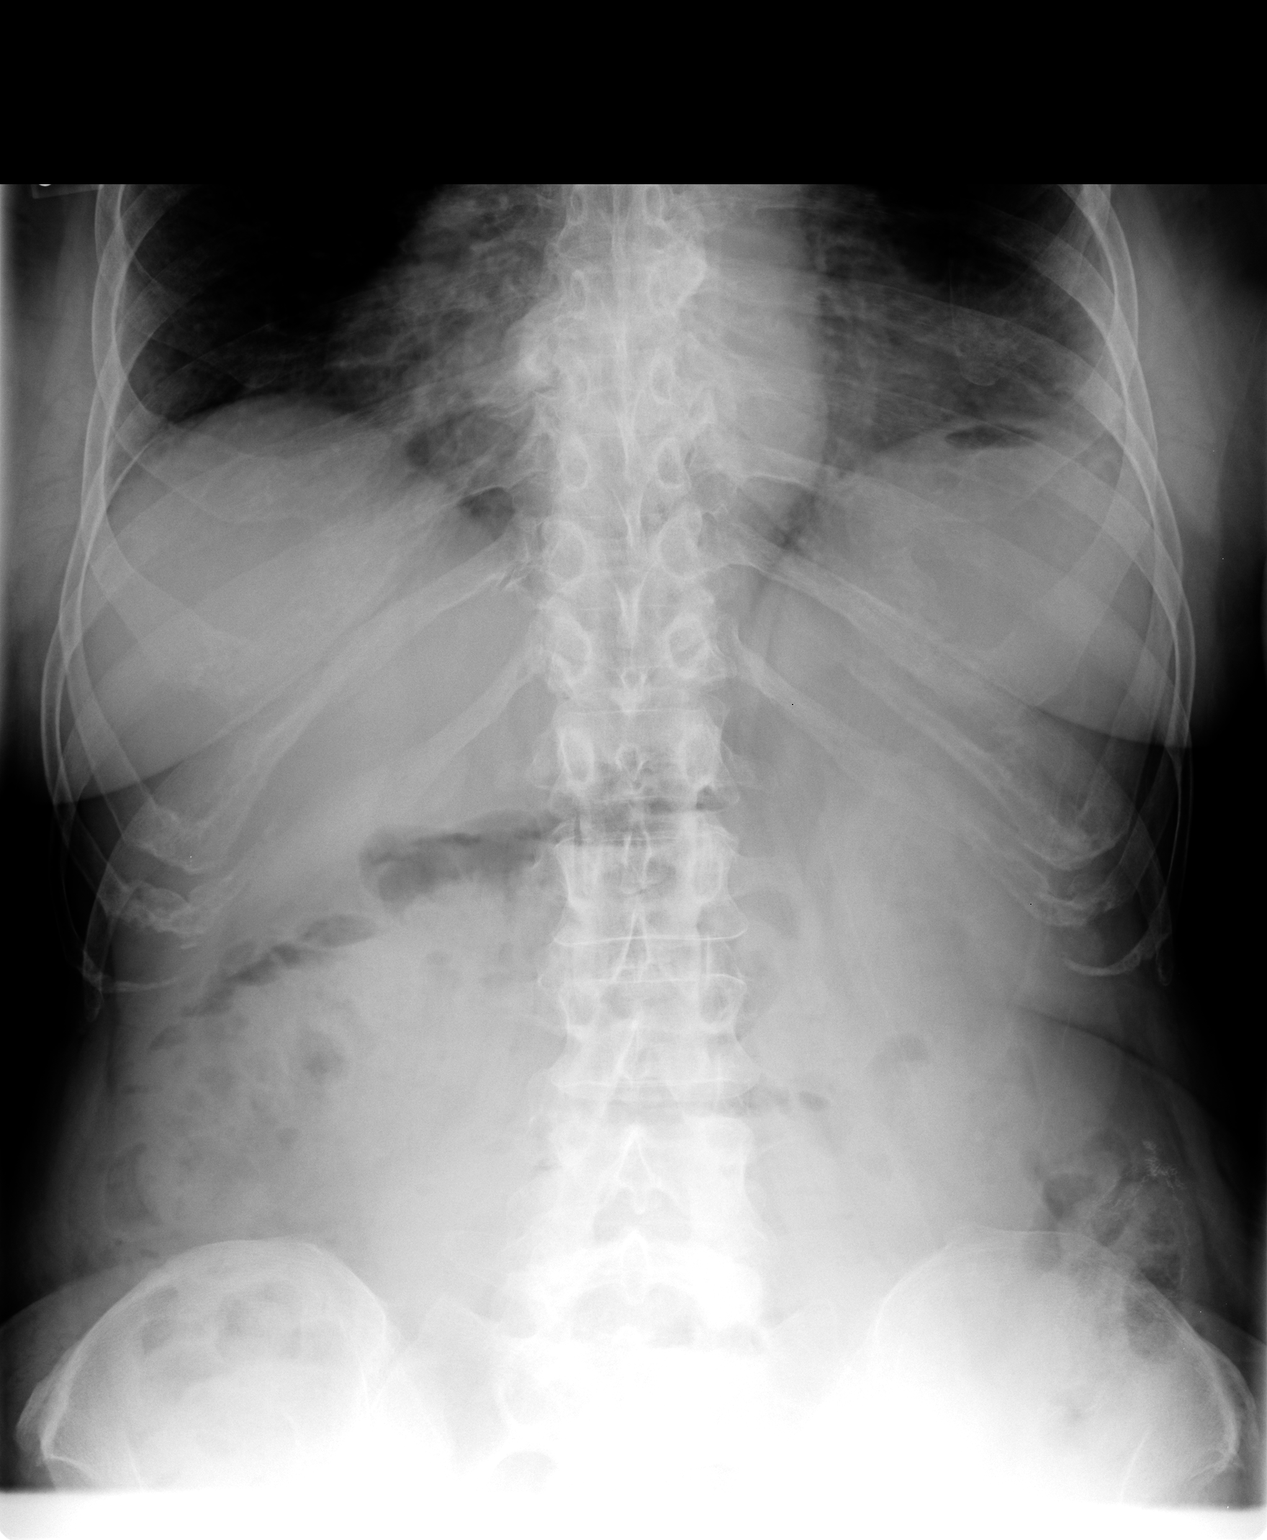

[view not recorded (3 of 3)]
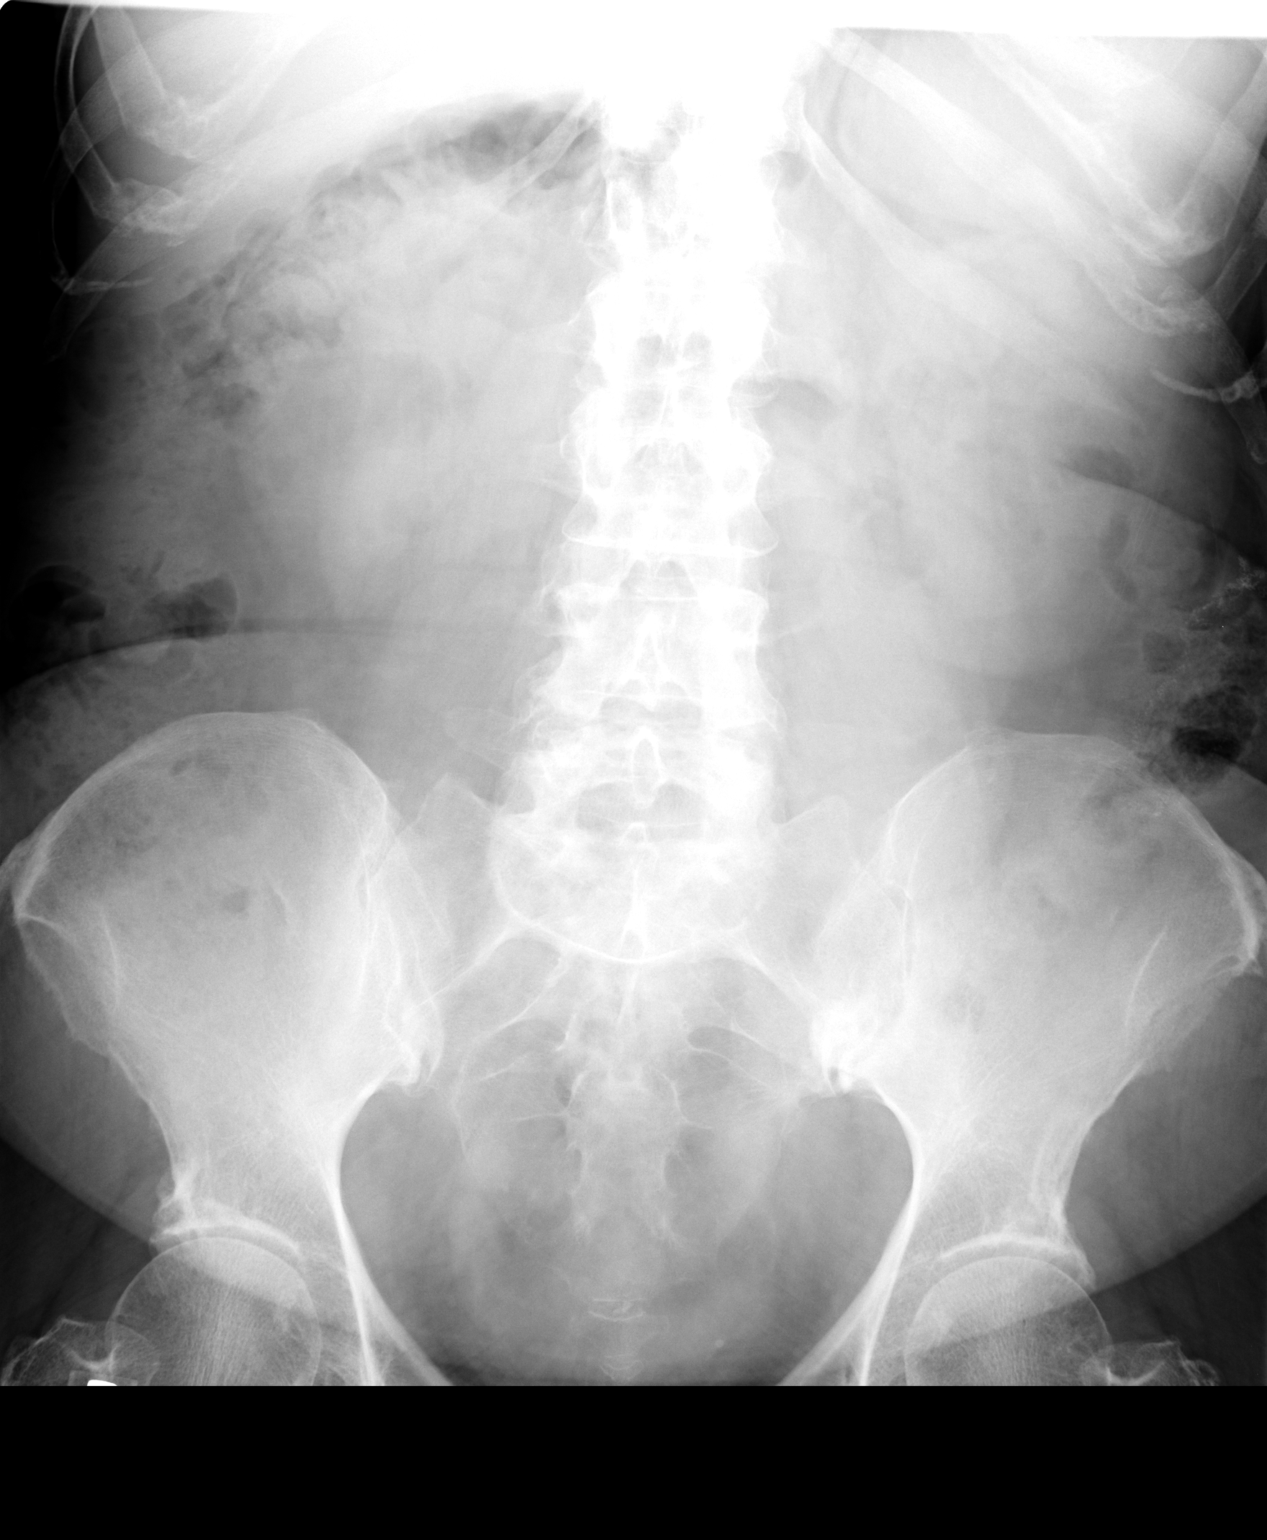

[3 of 3 positions shown; findings below may reference images not displayed]

IMPRESSION: No acute abnormality.

## 2006-06-11 IMAGING — US US ABDOMEN COMPLETE
1 series · 13 of 25 positions shown · non-contrast
Comparison: none

CLINICAL DATA: Nausea and vomiting.  Atrial flutter.
 COMPLETE ABDOMINAL ULTRASOUND ? 07/21/04: 
 Comparing conventional radiographs of [DATE] and prior CT of the abdomen and pelvis from 04/29/01.

[Series 1: unknown · 0.35mm/px · 13 of 87 slices shown]
[im 1/87]
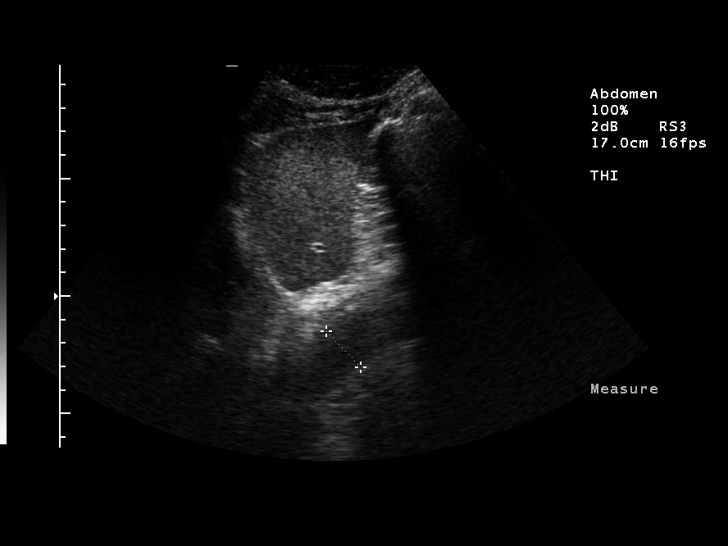
[im 8/87]
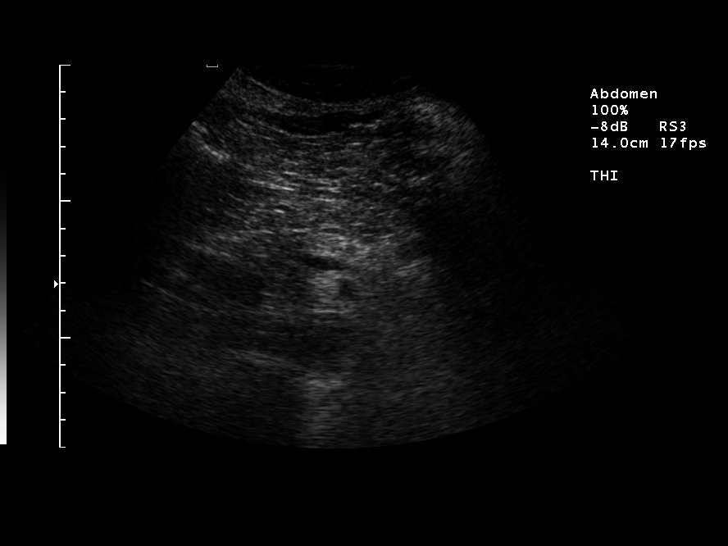
[im 15/87]
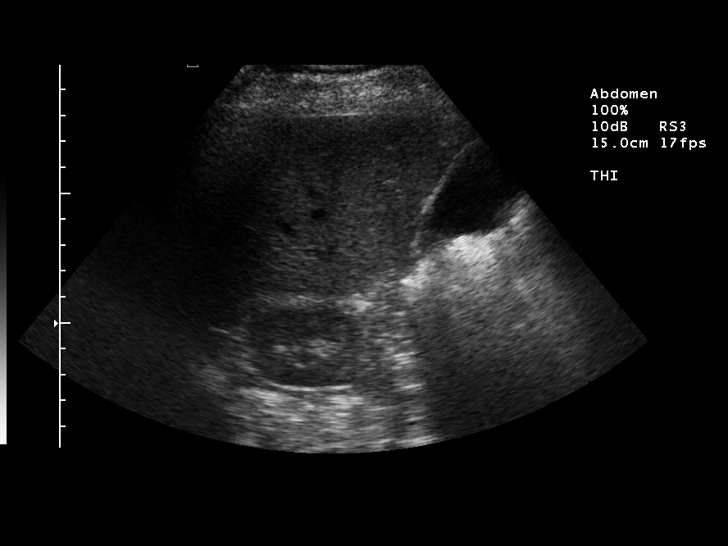
[im 22/87]
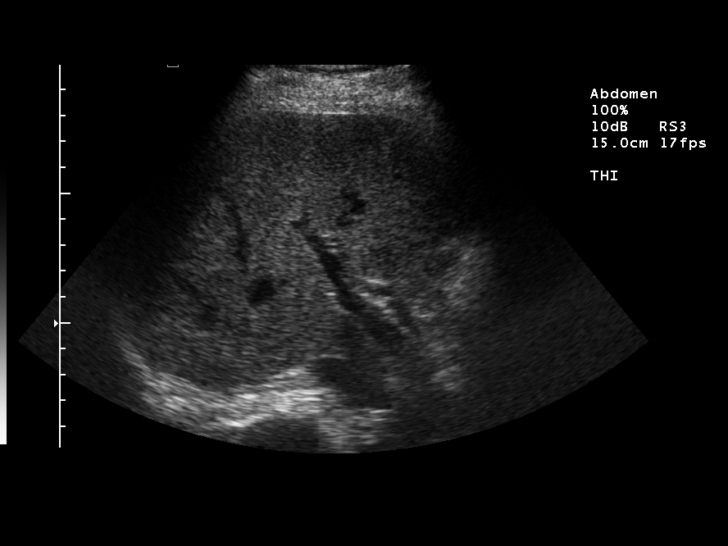
[im 29/87]
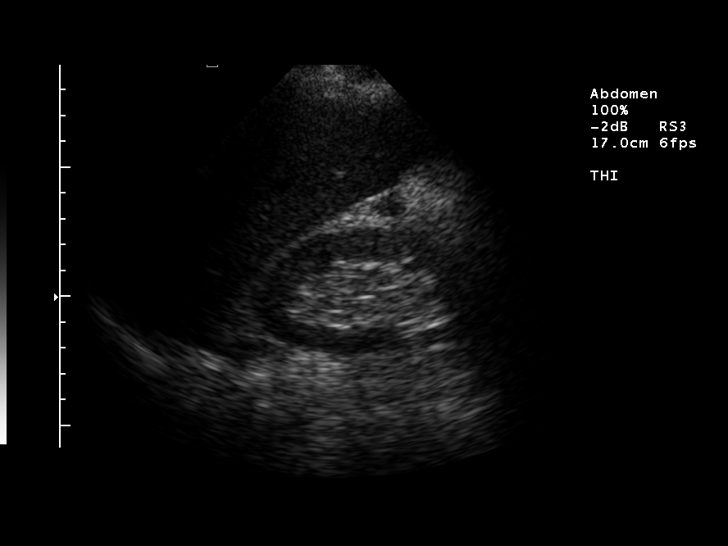
[im 36/87]
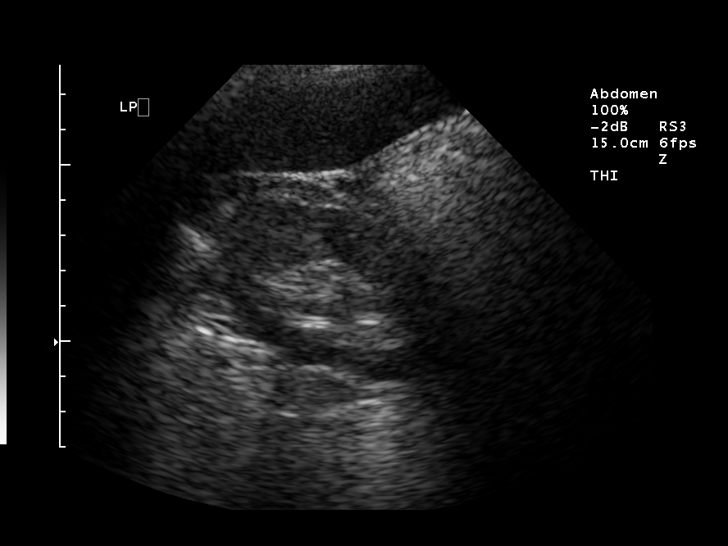
[im 44/87]
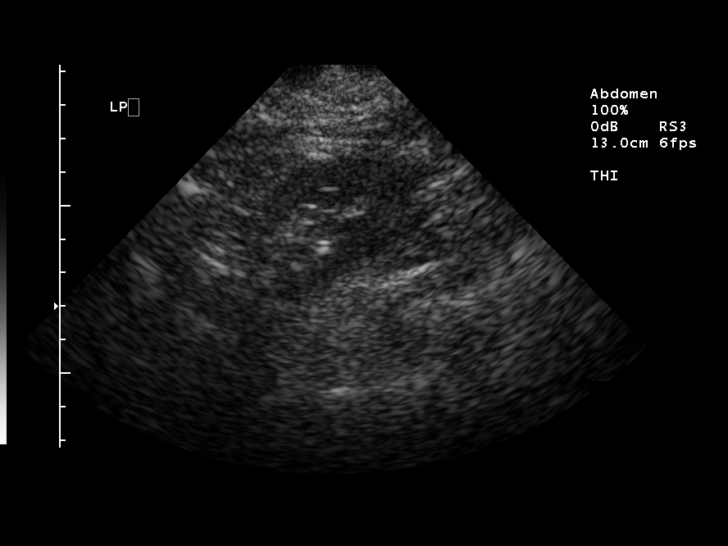
[im 51/87]
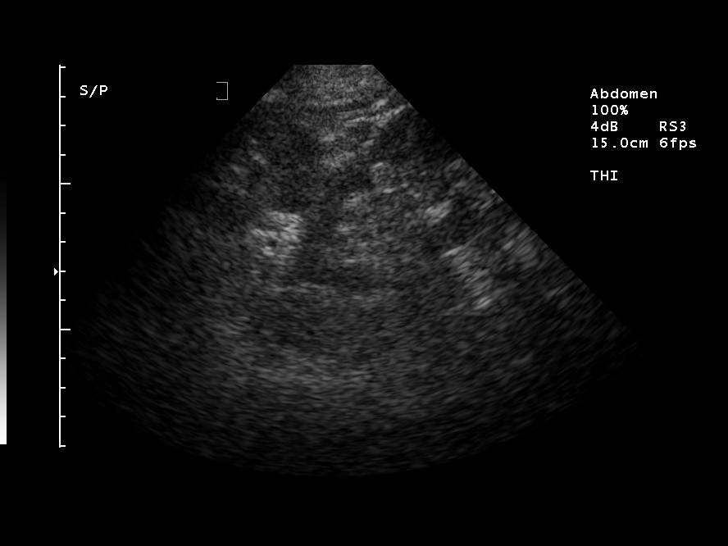
[im 58/87]
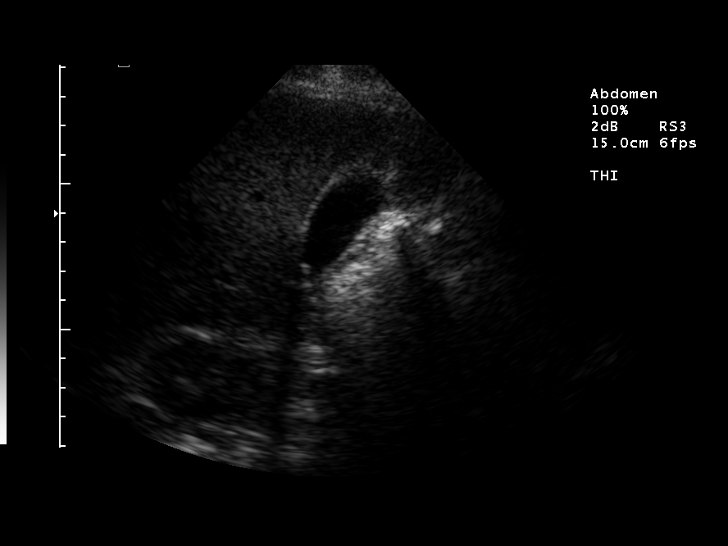
[im 65/87]
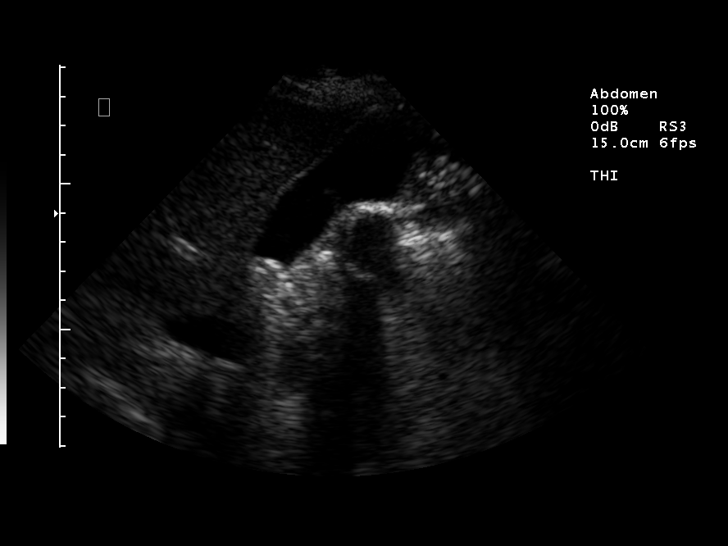
[im 72/87]
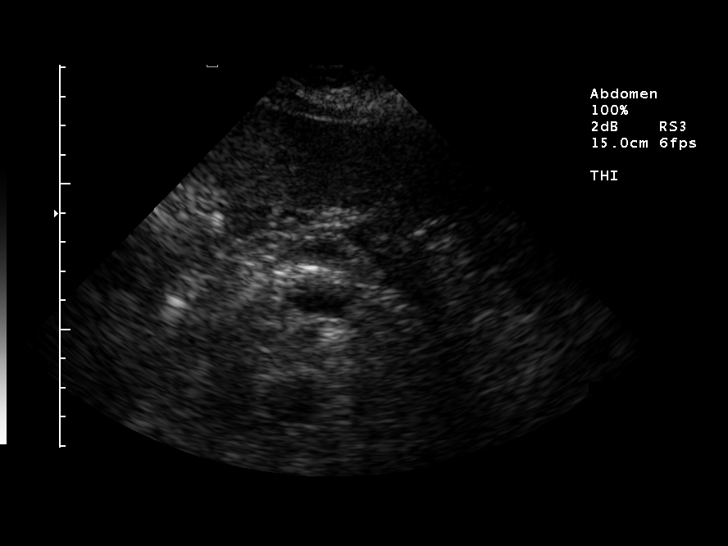
[im 79/87]
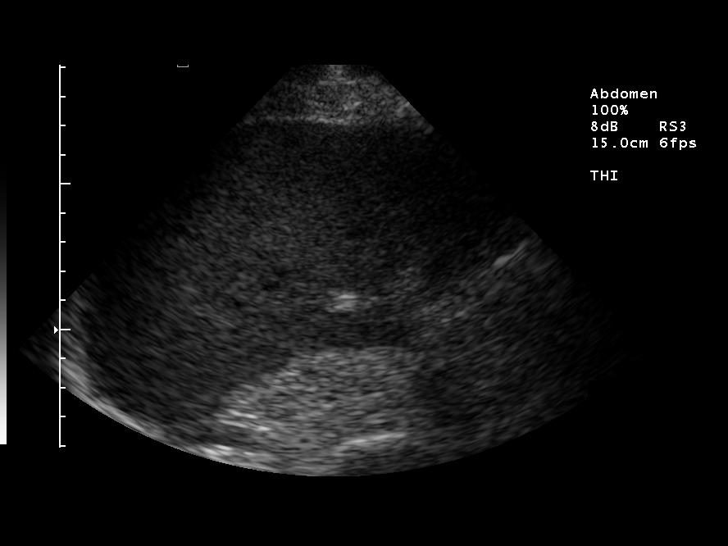
[im 87/87]
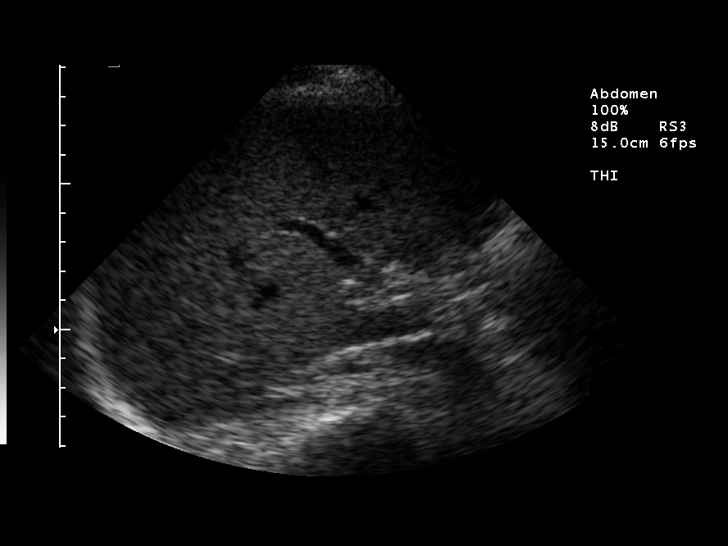

[13 of 25 positions shown; findings below may reference images not displayed]

FINDINGS: The pancreatic body appears normal.  The pancreatic head and tail are obscured by overlying bowel gas.  
 On the prior CT scan a small central hemangioma was demonstrated.  This is not readily apparent sonographically.  The liver appears unremarkable.  
 There is no intrahepatic biliary dilatation.  The common bile duct measures 4.2 mm which is within normal limits.  We directly demonstrate no choledocholithiasis.
 The gallbladder demonstrates a 7 mm gallstone.  No gallbladder wall thickening or pericholecystic fluid.  
 The IVC appears unremarkable.
 The patient has had prior splenectomy.  An accessory or regenerative spleen measures 3.8 x 2.1 x 2.9 cm.  
 The right kidney measures 10.3 cm in greatest length and the left kidney measures 9.9 cm in greatest length.  No evidence of renal stone, mass, or hydronephrosis.  
 The abdominal aorta demonstrates a normal tapering course.
IMPRESSION: 1.  Gallstone.
 2.  No hepatic mass is identified.  On the prior CT scan, a central hemangioma was characterized, but it is not apparent on today?s ultrasound.  CT scan may be more sensitive in detecting small hepatic lesions.
 3.  Poor visualization of the pancreas.
 4.  Prior splenectomy.  A regenerative/accessory spleen is noted.

## 2006-07-23 HISTORY — PX: CARDIAC CATHETERIZATION: SHX172

## 2006-08-21 ENCOUNTER — Ambulatory Visit: Payer: Self-pay | Admitting: Oncology

## 2006-08-26 LAB — COMPREHENSIVE METABOLIC PANEL
Albumin: 3.8 g/dL (ref 3.5–5.2)
BUN: 22 mg/dL (ref 6–23)
CO2: 30 mEq/L (ref 19–32)
Glucose, Bld: 150 mg/dL — ABNORMAL HIGH (ref 70–99)
Potassium: 3.8 mEq/L (ref 3.5–5.3)
Sodium: 143 mEq/L (ref 135–145)
Total Protein: 7.2 g/dL (ref 6.0–8.3)

## 2006-08-26 LAB — IRON AND TIBC
Iron: 184 ug/dL — ABNORMAL HIGH (ref 42–145)
TIBC: 336 ug/dL (ref 250–470)
UIBC: 152 ug/dL

## 2006-08-26 LAB — CBC WITH DIFFERENTIAL/PLATELET
Basophils Absolute: 0.1 10*3/uL (ref 0.0–0.1)
Eosinophils Absolute: 0.1 10*3/uL (ref 0.0–0.5)
HCT: 41.3 % (ref 34.8–46.6)
HGB: 14.4 g/dL (ref 11.6–15.9)
MCH: 30.2 pg (ref 26.0–34.0)
MCV: 86.4 fL (ref 81.0–101.0)
MONO%: 9.8 % (ref 0.0–13.0)
NEUT#: 4.4 10*3/uL (ref 1.5–6.5)
NEUT%: 65.9 % (ref 39.6–76.8)
RDW: 18.9 % — ABNORMAL HIGH (ref 11.3–14.5)

## 2006-09-20 IMAGING — CR DG CHEST 2V
2 series · 2 of 2 positions shown · non-contrast
Comparison: none

CLINICAL DATA: Short of breath.  
 CHEST X-RAY: 
 Two views of the chest are compared to a CT angio chest of 05/04/04.  Cardiomegaly is stable.  The fusiform dilatation of the ascending aorta is not as visible on chest x-ray as was described on CT of the chest.  No infiltrate or effusion is seen.  No acute bony abnormality is noted.

[w chest pa]
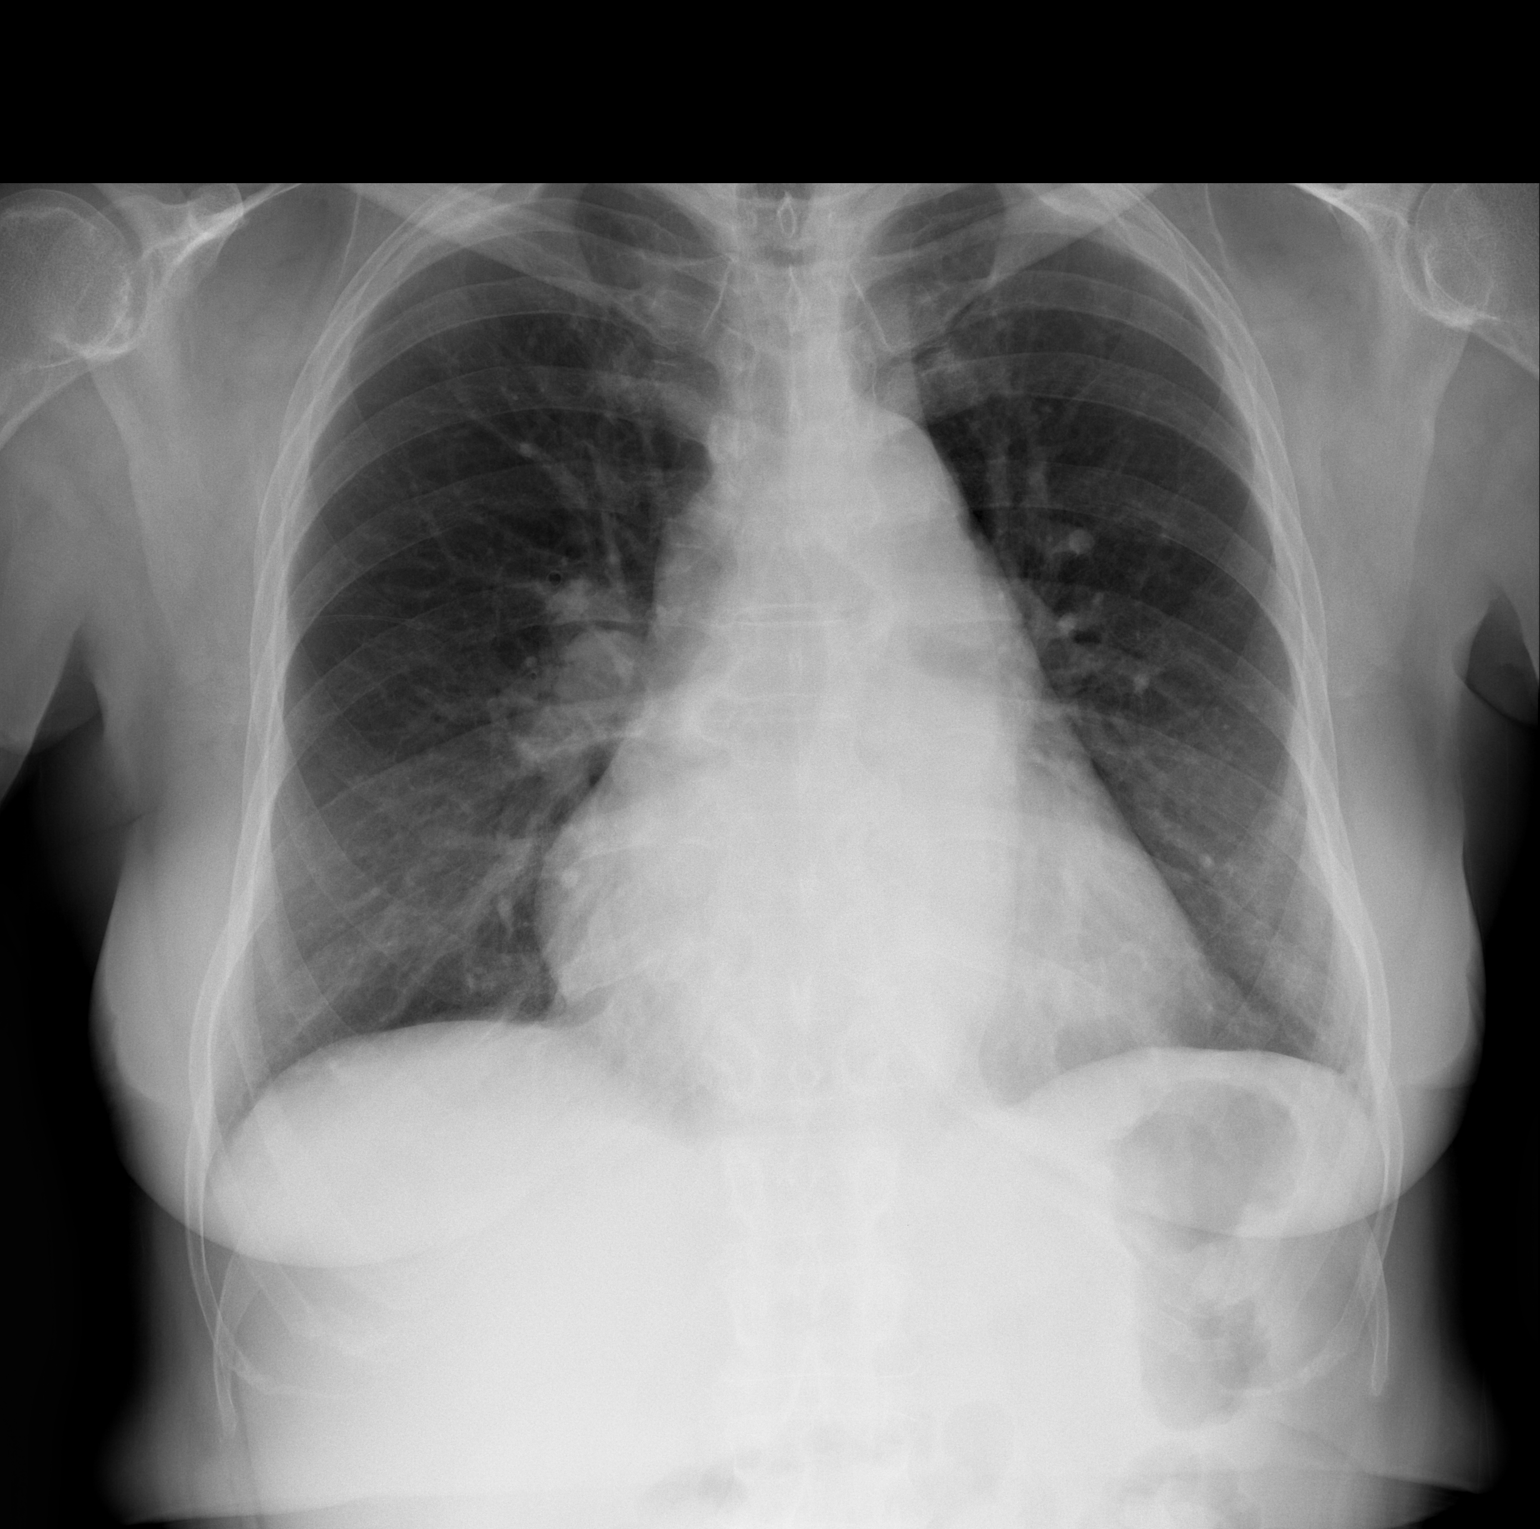

[w chest lat]
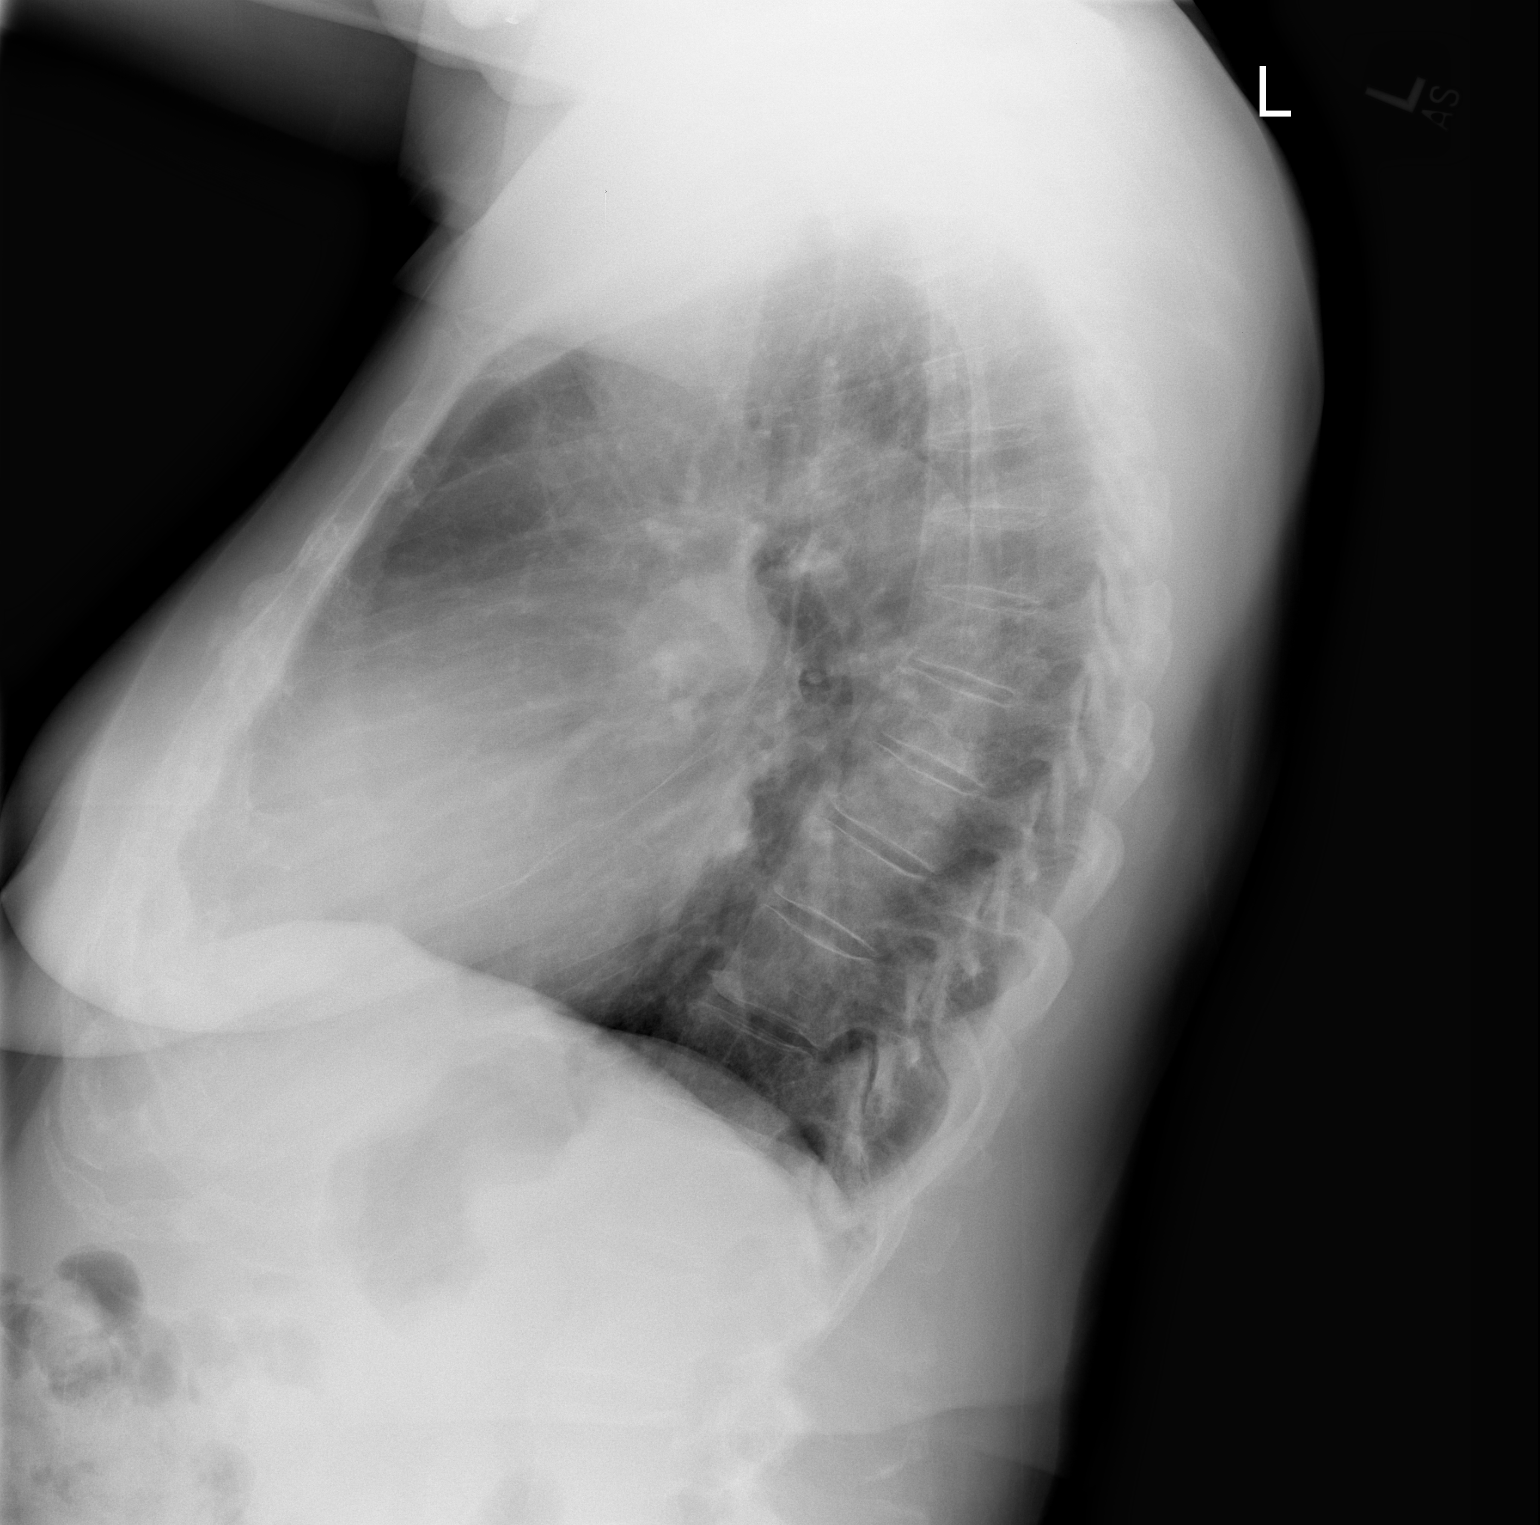

[2 of 2 positions shown; findings below may reference images not displayed]

IMPRESSION: Cardiomegaly.  No active lung disease.

## 2006-11-08 ENCOUNTER — Inpatient Hospital Stay (HOSPITAL_COMMUNITY): Admission: EM | Admit: 2006-11-08 | Discharge: 2006-11-15 | Payer: Self-pay | Admitting: Emergency Medicine

## 2007-02-07 IMAGING — CT CT PARANASAL SINUSES LIMITED
1 of 4 series · 16 of 30 positions shown, 20 images · IV contrast (agent unspecified)
Comparison: none

CLINICAL DATA: Swollen eyes with watering.  Some sinus pressure and drainage.
 LIMITED CT SINUS WITHOUT CONTRAST:
 Limited scans through the paranasal sinuses were performed in the prone coronal position.  There is mild mucosal thickening and possibly a small amount of fluid in the sphenoid sinus.  The remainder of the paranasal sinuses is well pneumatized with no other evidence of sinusitis.  There is pneumatization of the left middle turbinate but the nasal airway is patent with slight nasal septal deviation to the right of midline. No bony abnormality is seen.

[Series 5: ltd sinus 3.0 h30s · axial · 0.29mm/px · z∈[-98,+11]mm · 16 of 27 slices shown, 20 images]
[im 2/27  brain]
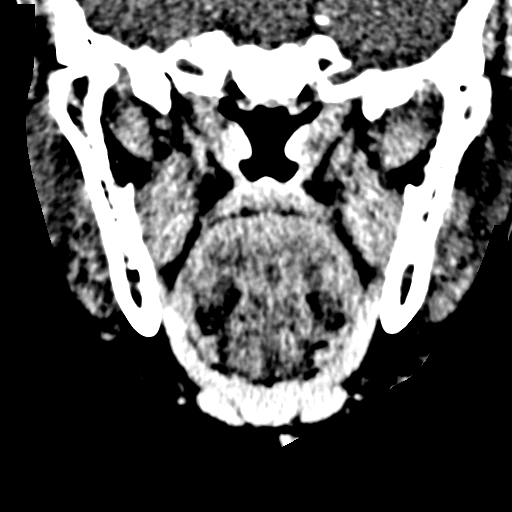
[im 2/27  bone]
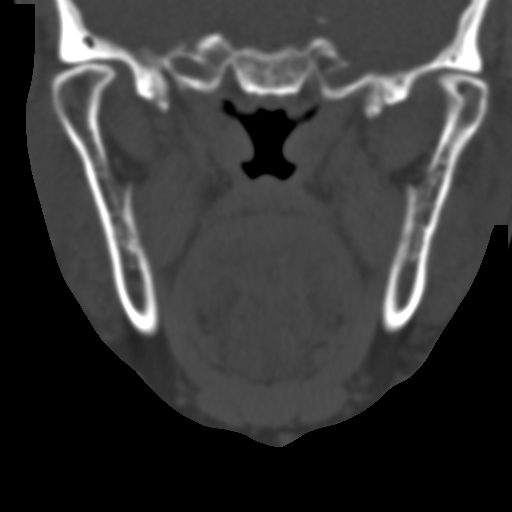
[im 3/27  bone]
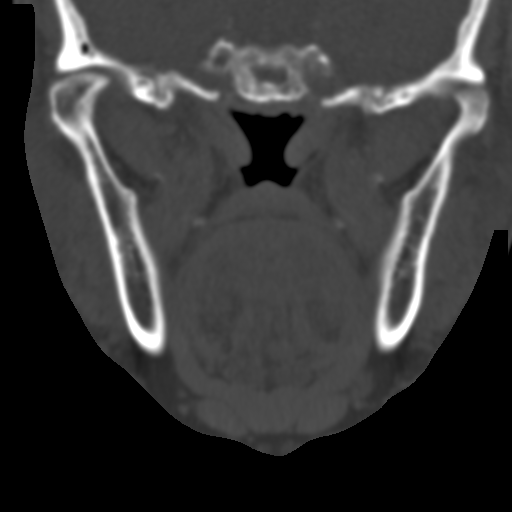
[im 5/27  bone]
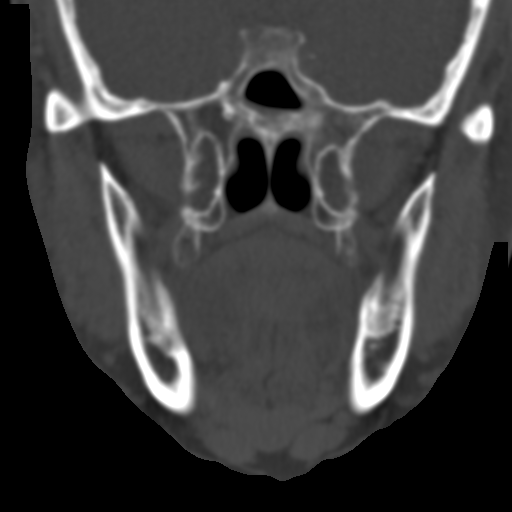
[im 6/27  bone]
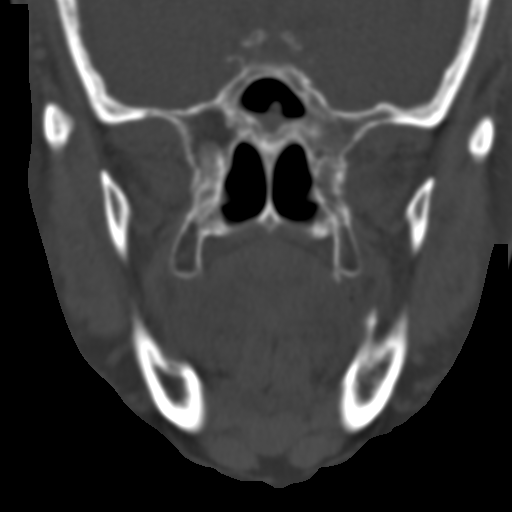
[im 8/27  brain]
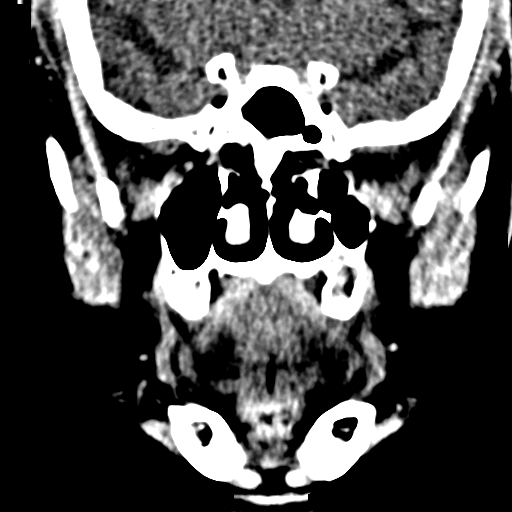
[im 8/27  bone]
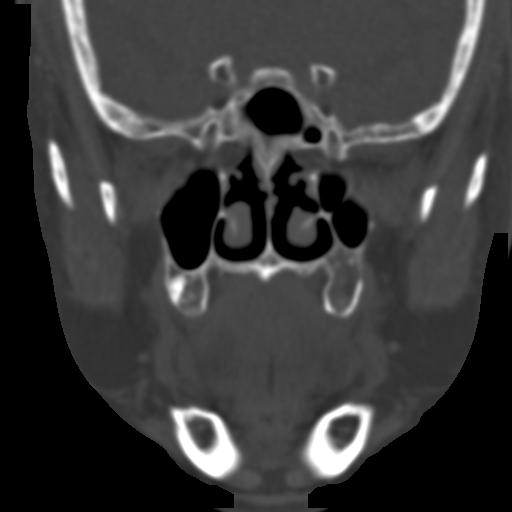
[im 10/27  bone]
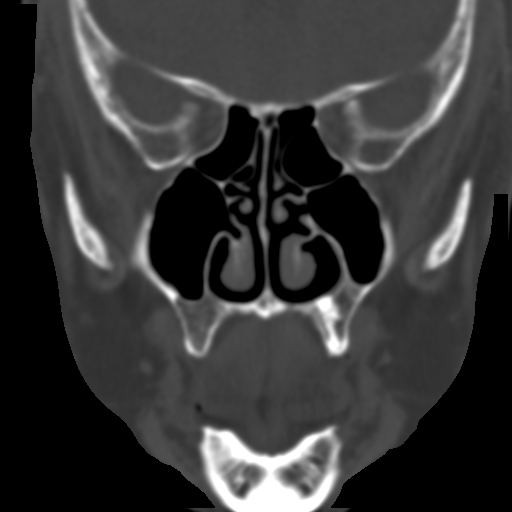
[im 11/27  bone]
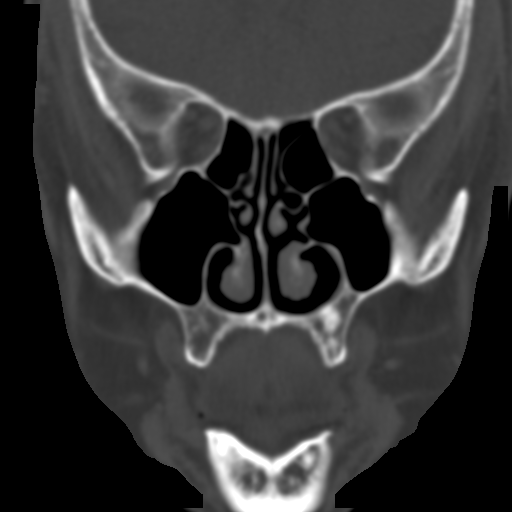
[im 12/27  bone]
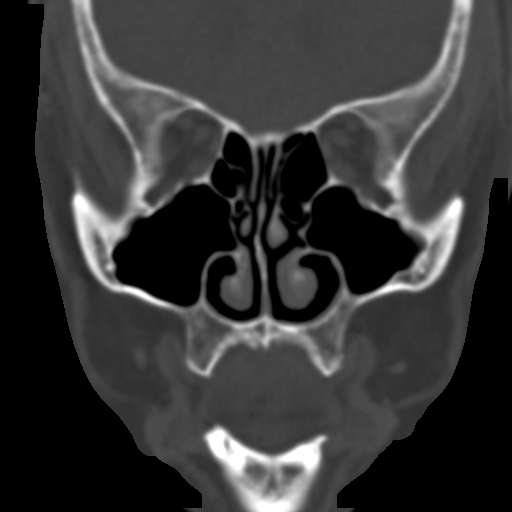
[im 15/27  brain]
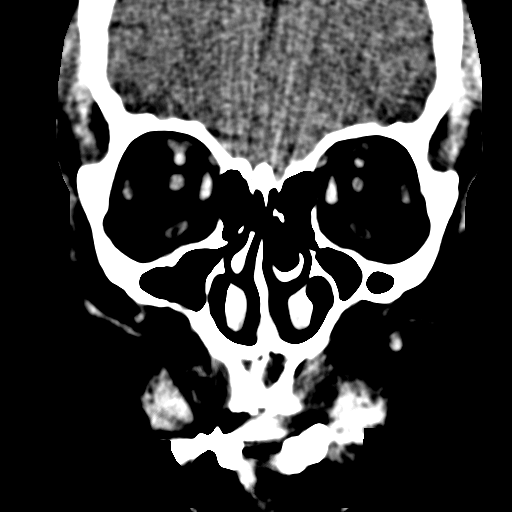
[im 15/27  bone]
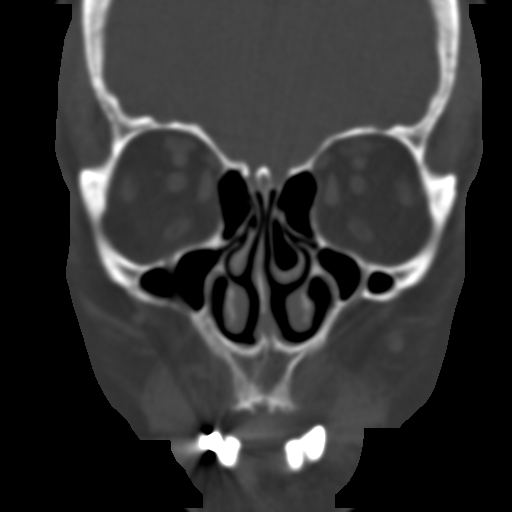
[im 16/27  bone]
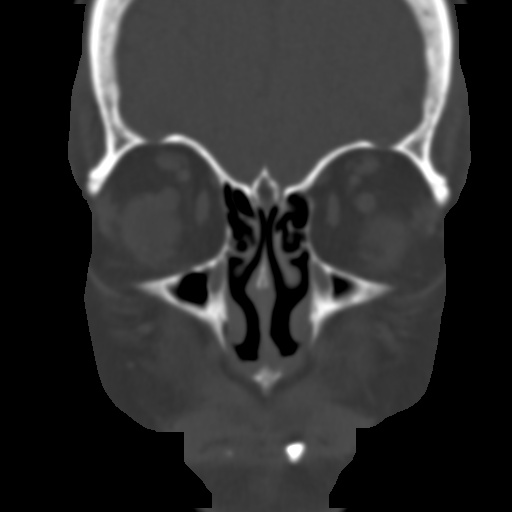
[im 17/27  bone]
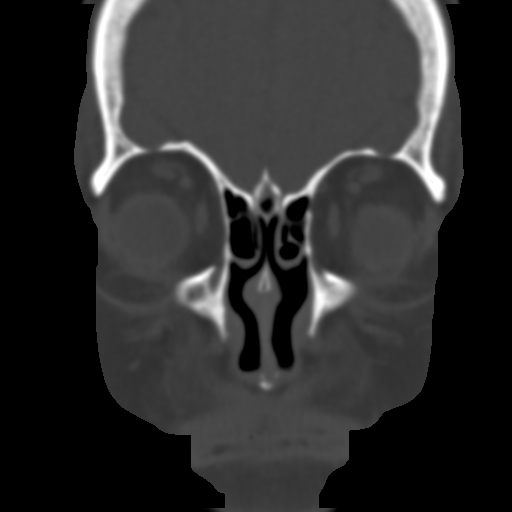
[im 19/27  bone]
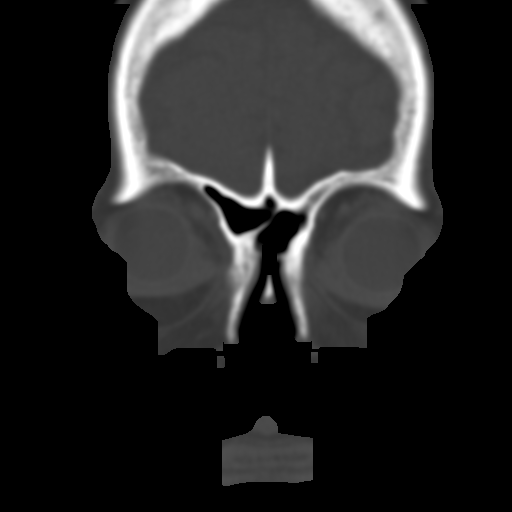
[im 21/27  brain]
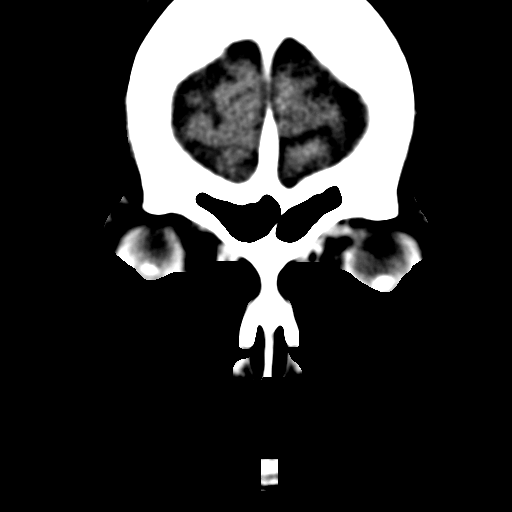
[im 21/27  bone]
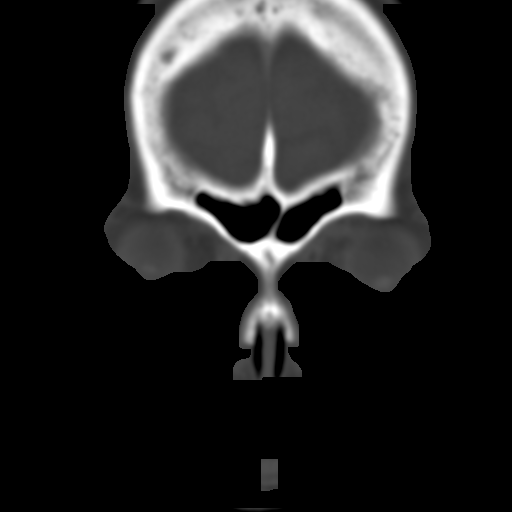
[im 22/27  bone]
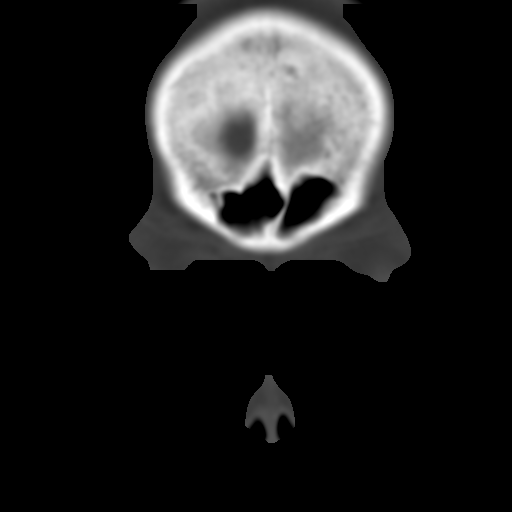
[im 24/27  bone]
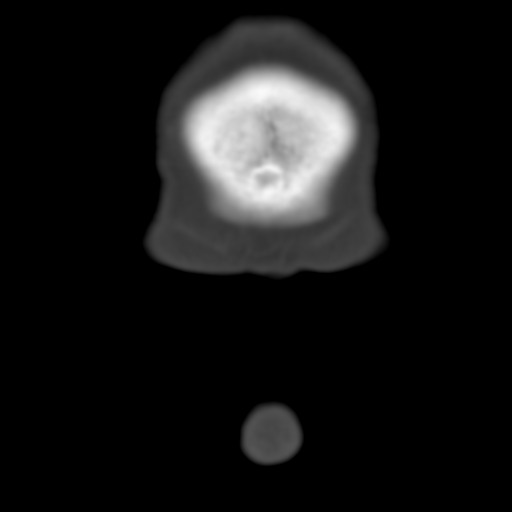
[im 25/27  bone]
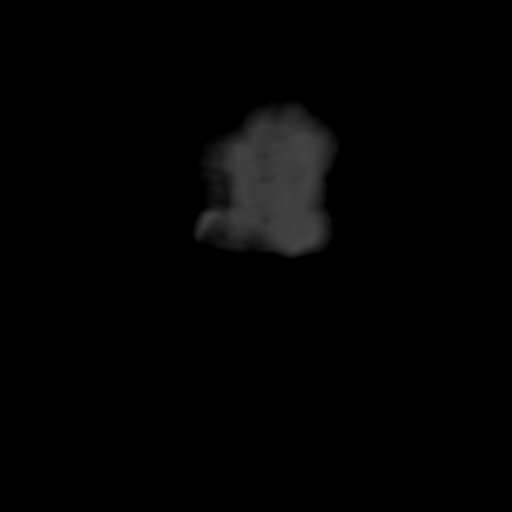

[16 of 30 positions shown; findings below may reference images not displayed]

IMPRESSION: Sphenoid sinus disease.  No other abnormality.

## 2007-02-19 ENCOUNTER — Ambulatory Visit: Payer: Self-pay | Admitting: Oncology

## 2007-02-24 LAB — COMPREHENSIVE METABOLIC PANEL
Albumin: 4 g/dL (ref 3.5–5.2)
BUN: 17 mg/dL (ref 6–23)
Calcium: 8.9 mg/dL (ref 8.4–10.5)
Chloride: 102 mEq/L (ref 96–112)
Glucose, Bld: 146 mg/dL — ABNORMAL HIGH (ref 70–99)
Potassium: 3.3 mEq/L — ABNORMAL LOW (ref 3.5–5.3)
Sodium: 141 mEq/L (ref 135–145)
Total Protein: 7.2 g/dL (ref 6.0–8.3)

## 2007-02-24 LAB — CBC WITH DIFFERENTIAL/PLATELET
Basophils Absolute: 0.1 10*3/uL (ref 0.0–0.1)
Eosinophils Absolute: 0.1 10*3/uL (ref 0.0–0.5)
HCT: 35.6 % (ref 34.8–46.6)
MONO#: 0.8 10*3/uL (ref 0.1–0.9)
Platelets: 267 10*3/uL (ref 145–400)
RBC: 4.13 10*6/uL (ref 3.70–5.32)
RDW: 17.7 % — ABNORMAL HIGH (ref 11.3–14.5)
lymph#: 1.4 10*3/uL (ref 0.9–3.3)

## 2007-02-24 LAB — FERRITIN: Ferritin: 113 ng/mL (ref 10–291)

## 2007-02-24 LAB — IRON AND TIBC
%SAT: 31 % (ref 20–55)
Iron: 84 ug/dL (ref 42–145)

## 2007-02-24 LAB — CEA: CEA: 2.1 ng/mL (ref 0.0–5.0)

## 2007-05-22 ENCOUNTER — Encounter: Admission: RE | Admit: 2007-05-22 | Discharge: 2007-05-22 | Payer: Self-pay | Admitting: Cardiothoracic Surgery

## 2007-05-22 ENCOUNTER — Ambulatory Visit: Payer: Self-pay | Admitting: Cardiothoracic Surgery

## 2007-07-30 LAB — HM DEXA SCAN: HM Dexa Scan: NORMAL

## 2007-08-26 ENCOUNTER — Ambulatory Visit: Payer: Self-pay | Admitting: Oncology

## 2007-08-28 LAB — COMPREHENSIVE METABOLIC PANEL
Albumin: 4.2 g/dL (ref 3.5–5.2)
Alkaline Phosphatase: 92 U/L (ref 39–117)
BUN: 21 mg/dL (ref 6–23)
CO2: 29 mEq/L (ref 19–32)
Calcium: 8.5 mg/dL (ref 8.4–10.5)
Chloride: 102 mEq/L (ref 96–112)
Glucose, Bld: 166 mg/dL — ABNORMAL HIGH (ref 70–99)
Potassium: 3.9 mEq/L (ref 3.5–5.3)
Sodium: 142 mEq/L (ref 135–145)
Total Protein: 7.3 g/dL (ref 6.0–8.3)

## 2007-08-28 LAB — CBC WITH DIFFERENTIAL/PLATELET
Basophils Absolute: 0 10*3/uL (ref 0.0–0.1)
Eosinophils Absolute: 0.2 10*3/uL (ref 0.0–0.5)
HGB: 13.2 g/dL (ref 11.6–15.9)
MCV: 87.3 fL (ref 81.0–101.0)
MONO#: 0.9 10*3/uL (ref 0.1–0.9)
MONO%: 10 % (ref 0.0–13.0)
NEUT#: 6.1 10*3/uL (ref 1.5–6.5)
RBC: 4.31 10*6/uL (ref 3.70–5.32)
RDW: 15.9 % — ABNORMAL HIGH (ref 11.3–14.5)
WBC: 8.8 10*3/uL (ref 3.9–10.0)
lymph#: 1.7 10*3/uL (ref 0.9–3.3)

## 2007-08-28 LAB — IRON AND TIBC
%SAT: 23 % (ref 20–55)
Iron: 67 ug/dL (ref 42–145)

## 2007-08-28 LAB — CEA: CEA: 1.3 ng/mL (ref 0.0–5.0)

## 2007-08-28 LAB — LACTATE DEHYDROGENASE: LDH: 153 U/L (ref 94–250)

## 2008-01-03 IMAGING — CT CT ANGIO CHEST
2 of 7 series · 18 of 46 positions shown · IV contrast (APPLIED)
Comparison: none

CLINICAL DATA: Fell. Left chest pain. Colon carcinoma.

[Series 10: pulm embolism 2.0 st · axial · 0.62mm/px · z∈[-323,-53]mm · 15 of 154 slices shown]
[im 10/154  lung]
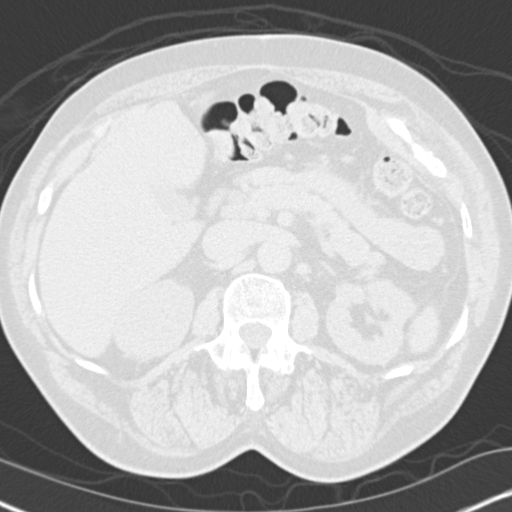
[im 19/154  soft-tissue]
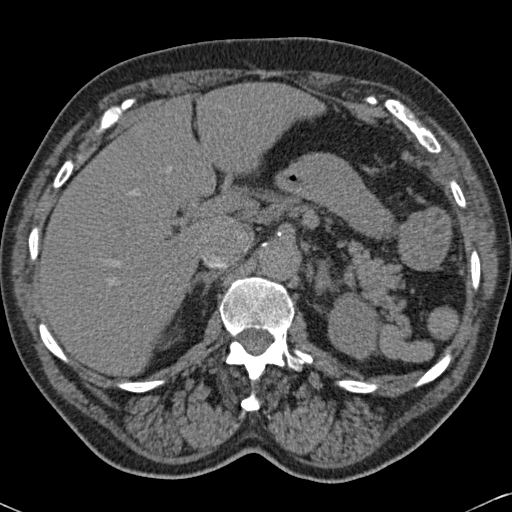
[im 28/154  lung]
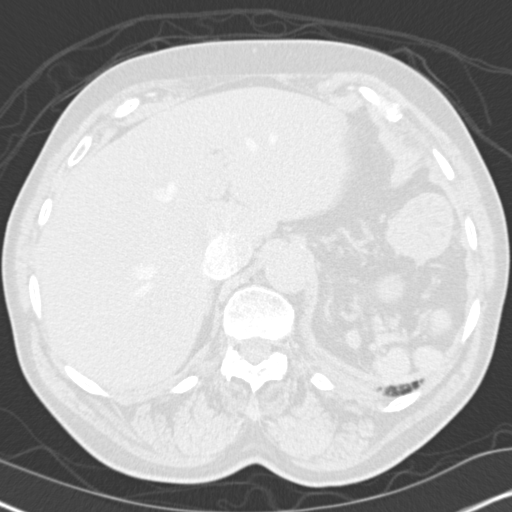
[im 37/154  soft-tissue]
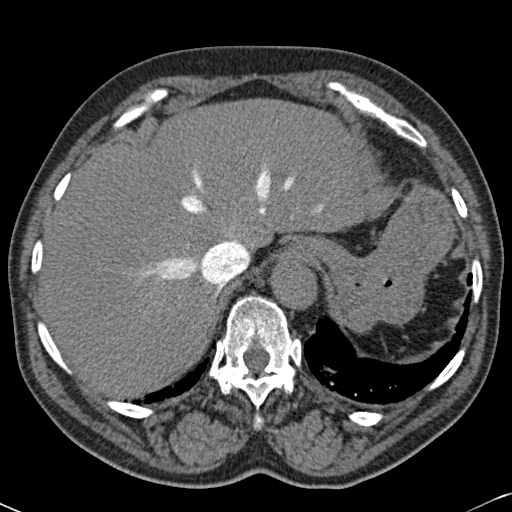
[im 46/154  lung]
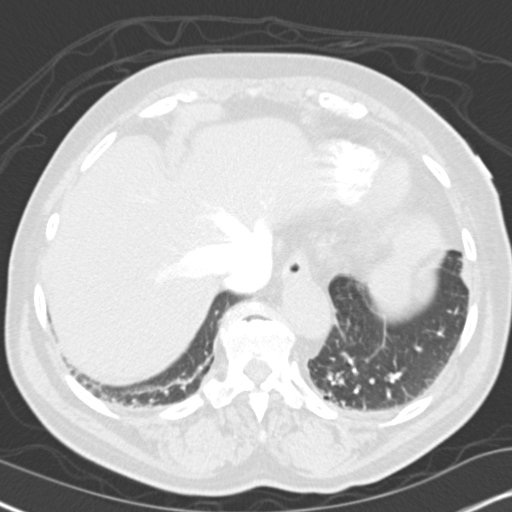
[im 55/154  soft-tissue]
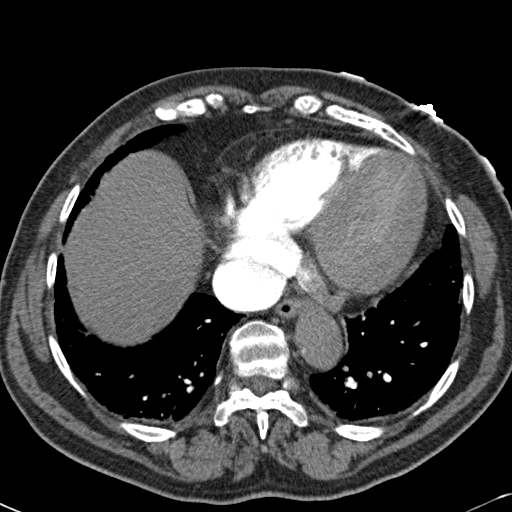
[im 64/154  lung]
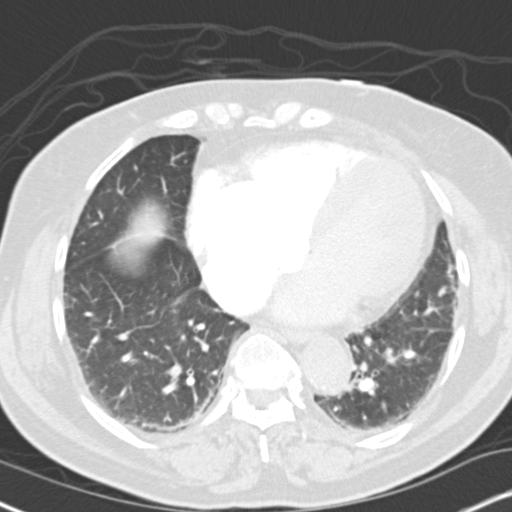
[im 82/154  soft-tissue]
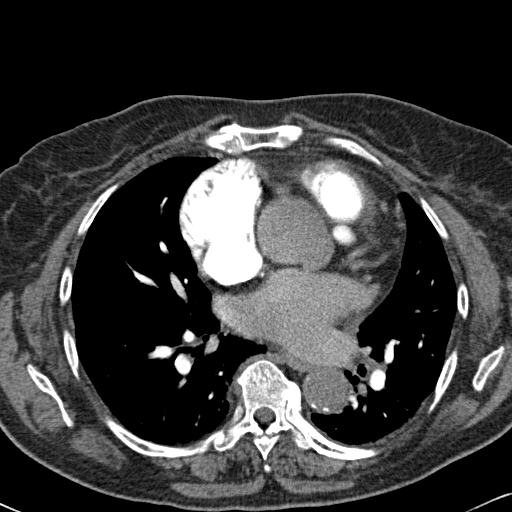
[im 91/154  lung]
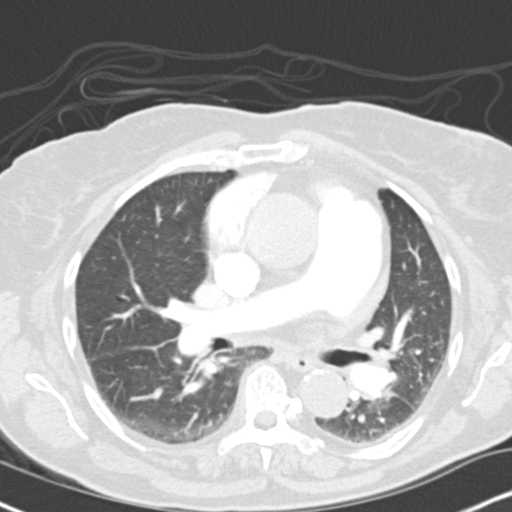
[im 100/154  soft-tissue]
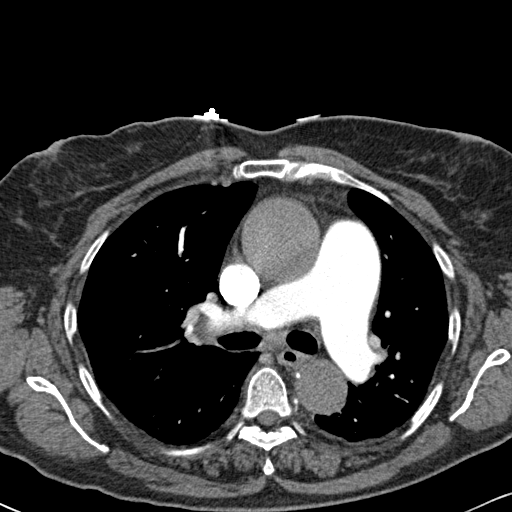
[im 109/154  lung]
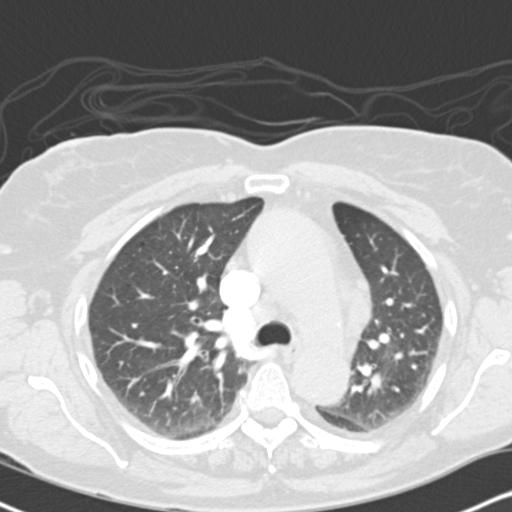
[im 118/154  soft-tissue]
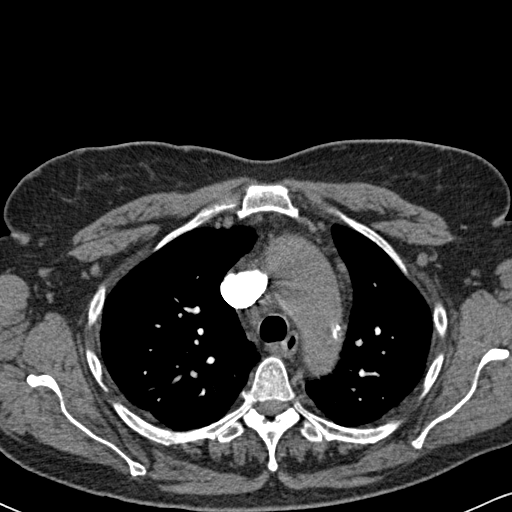
[im 127/154  lung]
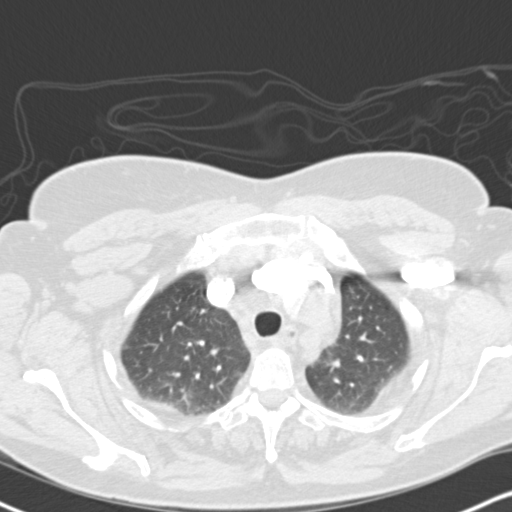
[im 136/154  soft-tissue]
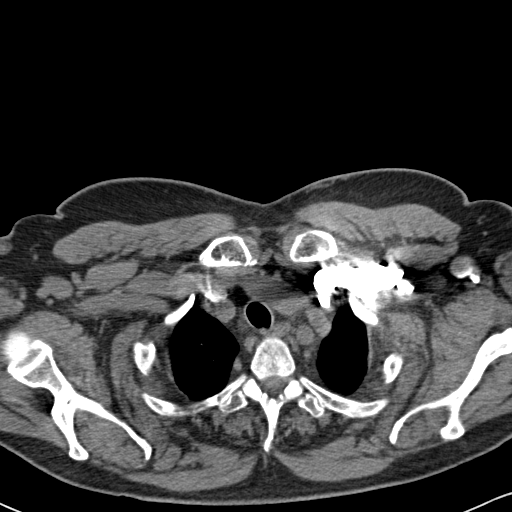
[im 145/154  lung]
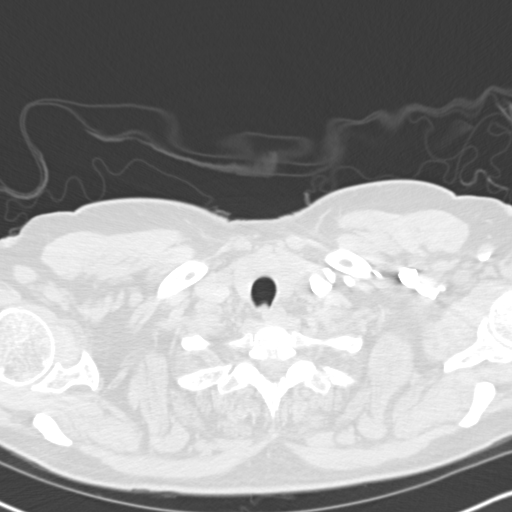

[Series 13: coronals · coronal · 0.63mm/px · 3 of 103 slices shown]
[im 26/103  soft-tissue]
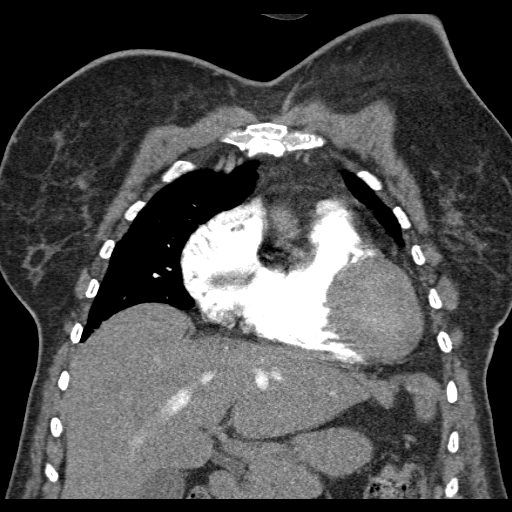
[im 52/103  soft-tissue]
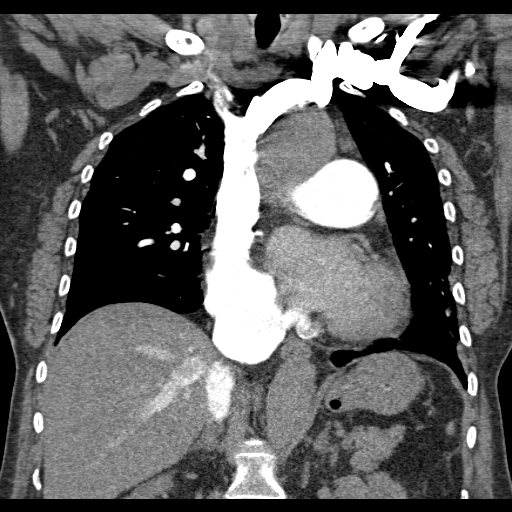
[im 77/103  soft-tissue]
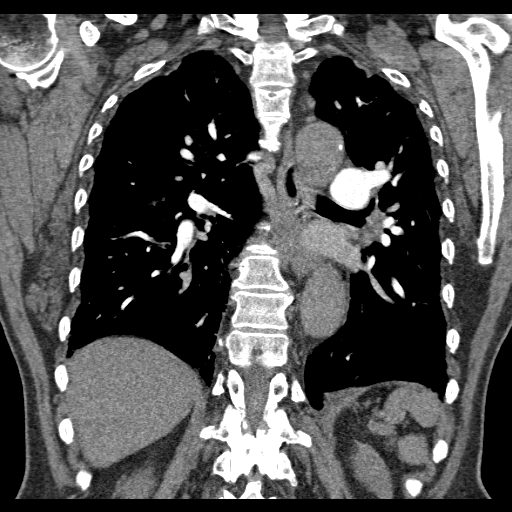

[18 of 46 positions shown; findings below may reference images not displayed]

CT angio chest with contrast:
Multidetector helical CT of the chest was obtained after 100 ml Omnipaque 300 
IV. CT multiplanar reconstructions were rendered to evaluate the vascular
anatomy. Comparison 05/04/2004. Good contrast opacification of pulmonary artery
branches. No filling defects to suggest acute PE. Dilated ascending aorta
measured up to 4.7cm diameter, tapering to more normal caliber in the arch and
descending thoracic aorta which nonetheless    demonstrate atheromatous
calcifications. No pleural or pericardial effusion. Subcentimeter prevascular,
precarinal, and subcarinal lymph nodes. Dependent atelectasis posteriorly in
both lower lobes. Reflux of contrast material noted to the hepatic veins
suggesting right heart dysfunction. Coronal and sagittal reconstructions confirm
the above findings. Bone windows unremarkable.
IMPRESSION: 1. Negative for acute PE.
2. Ascending aortic aneurysm, stable since 05/04/2004.

CT abdomen with contrast:

Unremarkable liver, adrenal glands, right kidney, pancreas. Changes of apparent
splenectomy with regenerated splenic tissue in the left upper abdomen. A
calcific density projects in the dependent portion of the gallbladder suggesting
cholelithiasis. Focal areas of parenchymal loss in the left kidney. Atheromatous
changes in the abdominal aorta without aneurysm. Small bowel decompressed. No
free air. No ascites. Portal vein patent. No mesenteric or retroperitoneal
adenopathy. 15 mm enhancing lesion in the medial left hepatic segment anterior
to the right portal vein, which was described on previous scan of 04/29/2001
probably represents a benign hemangioma.
IMPRESSION: 1. Negative for acute abdominal process.
2. Probable hepatic hemangioma.
3. Probable cholelithiasis

CT pelvis with contrast:

Normal appendix. The colon is nondilated. Anastomotic staple line in the distal
descending colon. Negative for mass or adenopathy. Urinary bladder incompletely
distended. Iliofemoral atheromatous changes.
IMPRESSION: 1. Normal appendix.
2. Postop changes.
3. Negative for mass or adenopathy.

## 2008-01-03 IMAGING — CR DG RIBS W/ CHEST 3+V*L*
3 series · 3 of 3 positions shown · non-contrast
Comparison: Chest x-ray 10/30/2004.

CLINICAL DATA: Fell ? left lower rib pain and chest pain with inspiration.
 LEFT RIBS WITH CHEST - 3 VIEW:

[view not recorded (1 of 3)]
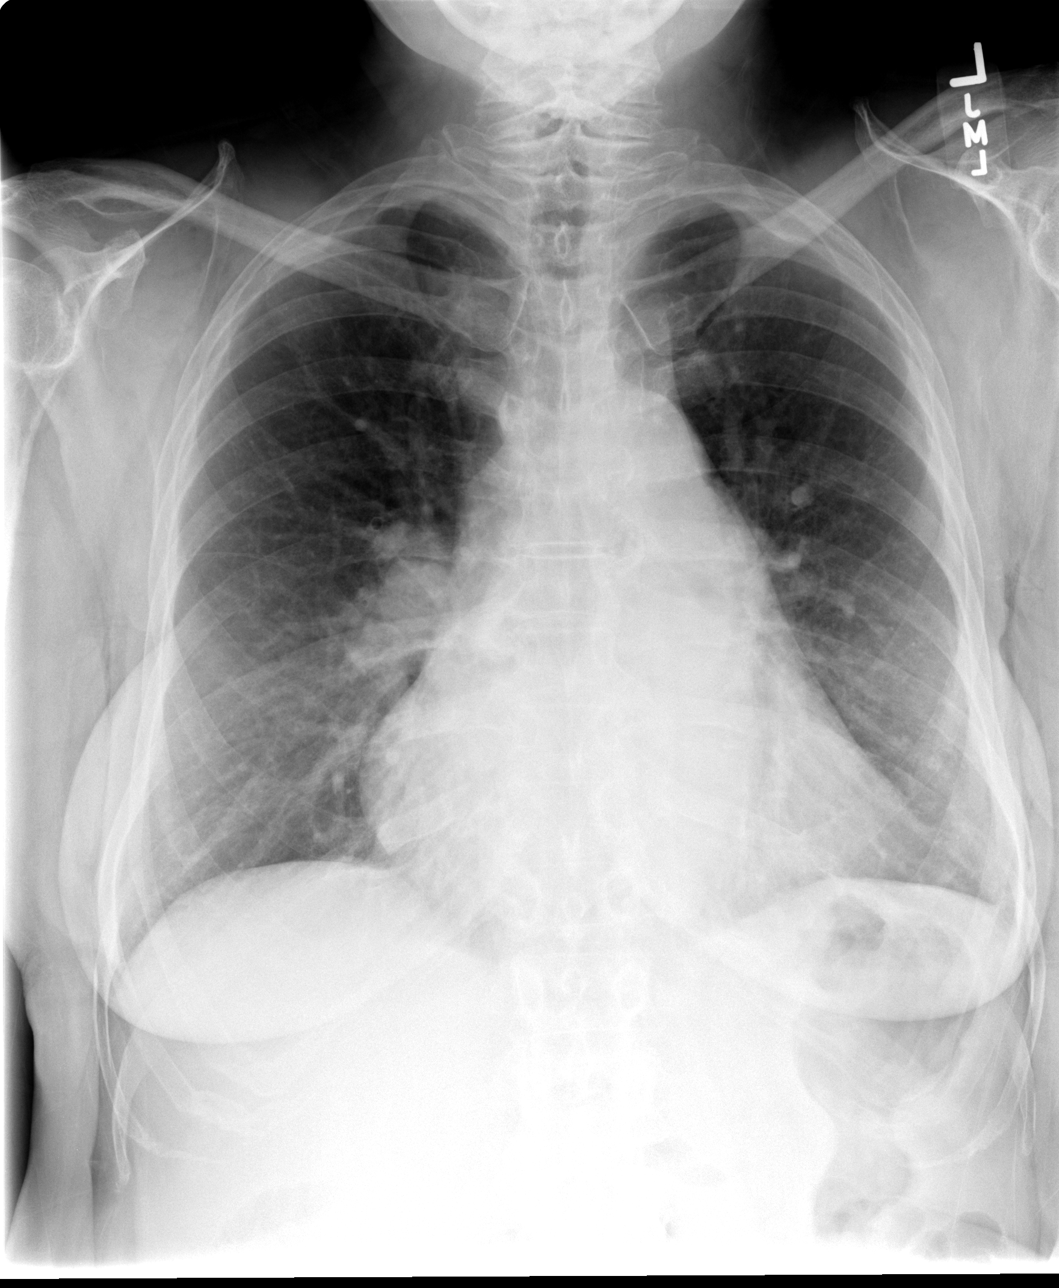

[view not recorded (2 of 3)]
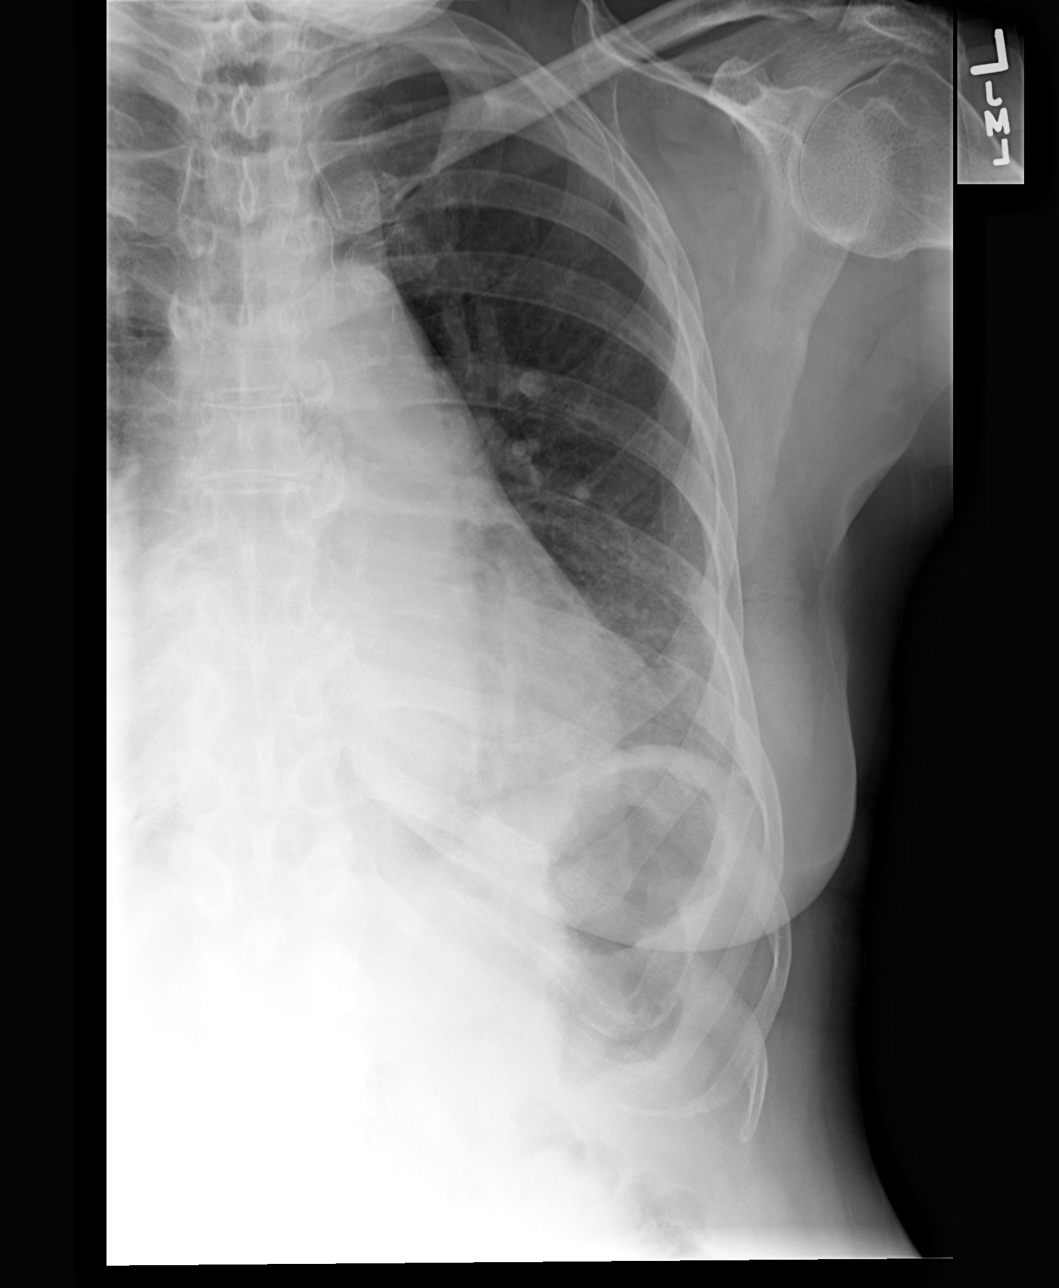

[view not recorded (3 of 3)]
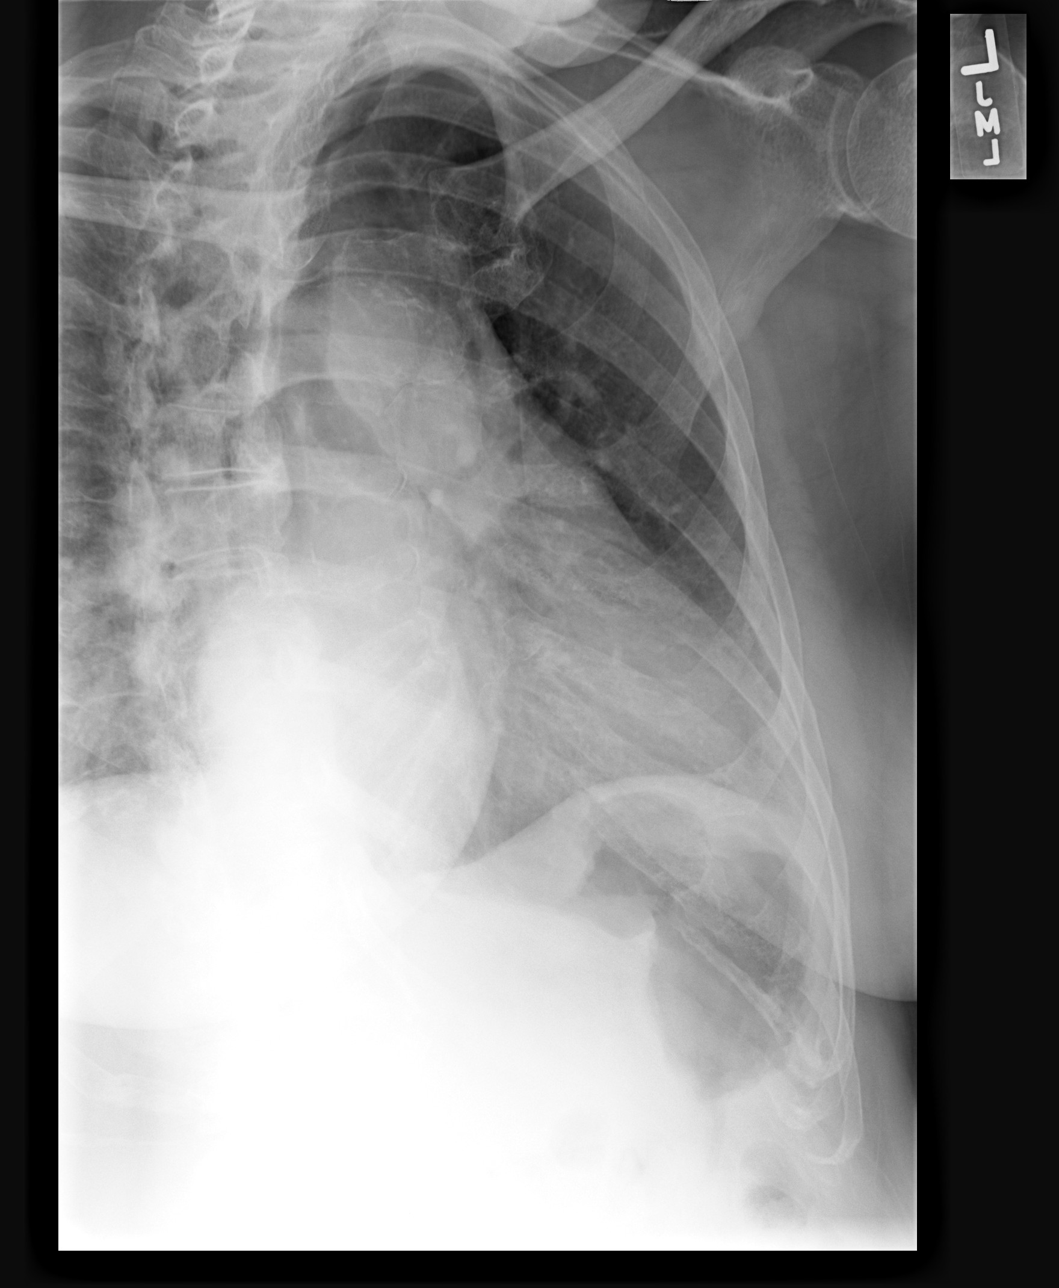

[3 of 3 positions shown; findings below may reference images not displayed]

FINDINGS: The heart is enlarged as before.  There appears to be mild venous hypertension without frank congestive heart failure.  The right hilum is prominent and appears more prominent when compared to 10/30/2004.  There was no adenopathy on a CT in [DATE] but one might consider repeating CT to rule out right hilar adenopathy.  
 There is no pneumothorax.  No definite rib fractures.  No pleural fluid or hemothorax.
IMPRESSION: 1.  Cardiomegaly with mild pulmonary venous hypertension.  
 2.  Prominent right hilum which looks larger when compared to [DATE]. Consider CT. 
 3.  No visible rib fractures, pneumothorax, or hemothorax.

## 2008-03-14 IMAGING — CR DG CHEST 1V PORT
1 series · 1 of 1 positions shown · non-contrast
Comparison: 02/12/06.

CLINICAL DATA: Shortness of breath.  
 PORTABLE CHEST - 1 VIEW, 0119 HOURS:

[view not recorded]
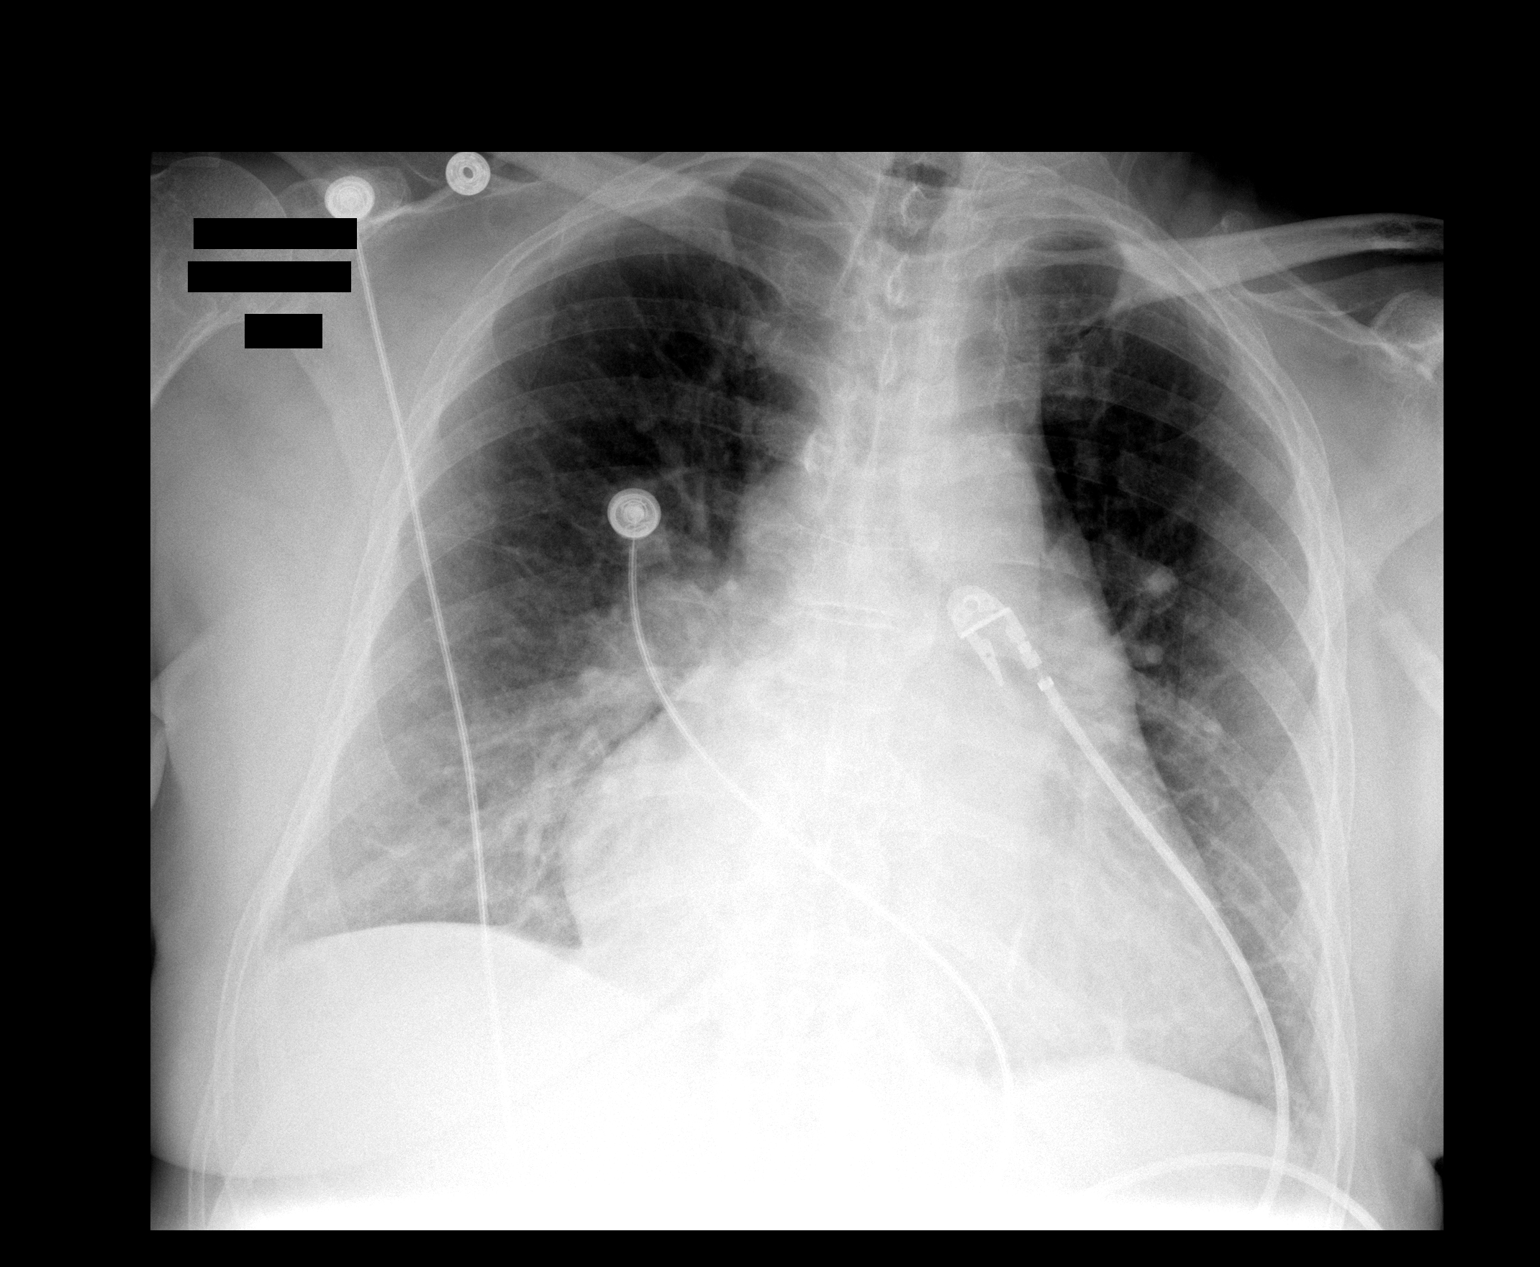

[1 of 1 positions shown; findings below may reference images not displayed]

FINDINGS: Cardiomegaly and pulmonary vascular congestion.  No frank pulmonary edema.  Minimal right pleural effusion.  Main pulmonary artery segment and central pulmonary arteries appear enlarged.  There is a clinical concern for pulmonary arterial hypertension.
IMPRESSION: Cardiomegaly and pulmonary vascular congestion.  Query PAH.

## 2008-03-14 IMAGING — CR DG CHEST 1V PORT
1 series · 1 of 1 positions shown · non-contrast
Comparison: 04/24/06.

CLINICAL DATA: Atrial fibrillation. 
 PORTABLE CHEST ? 1 VIEW:

[view not recorded]
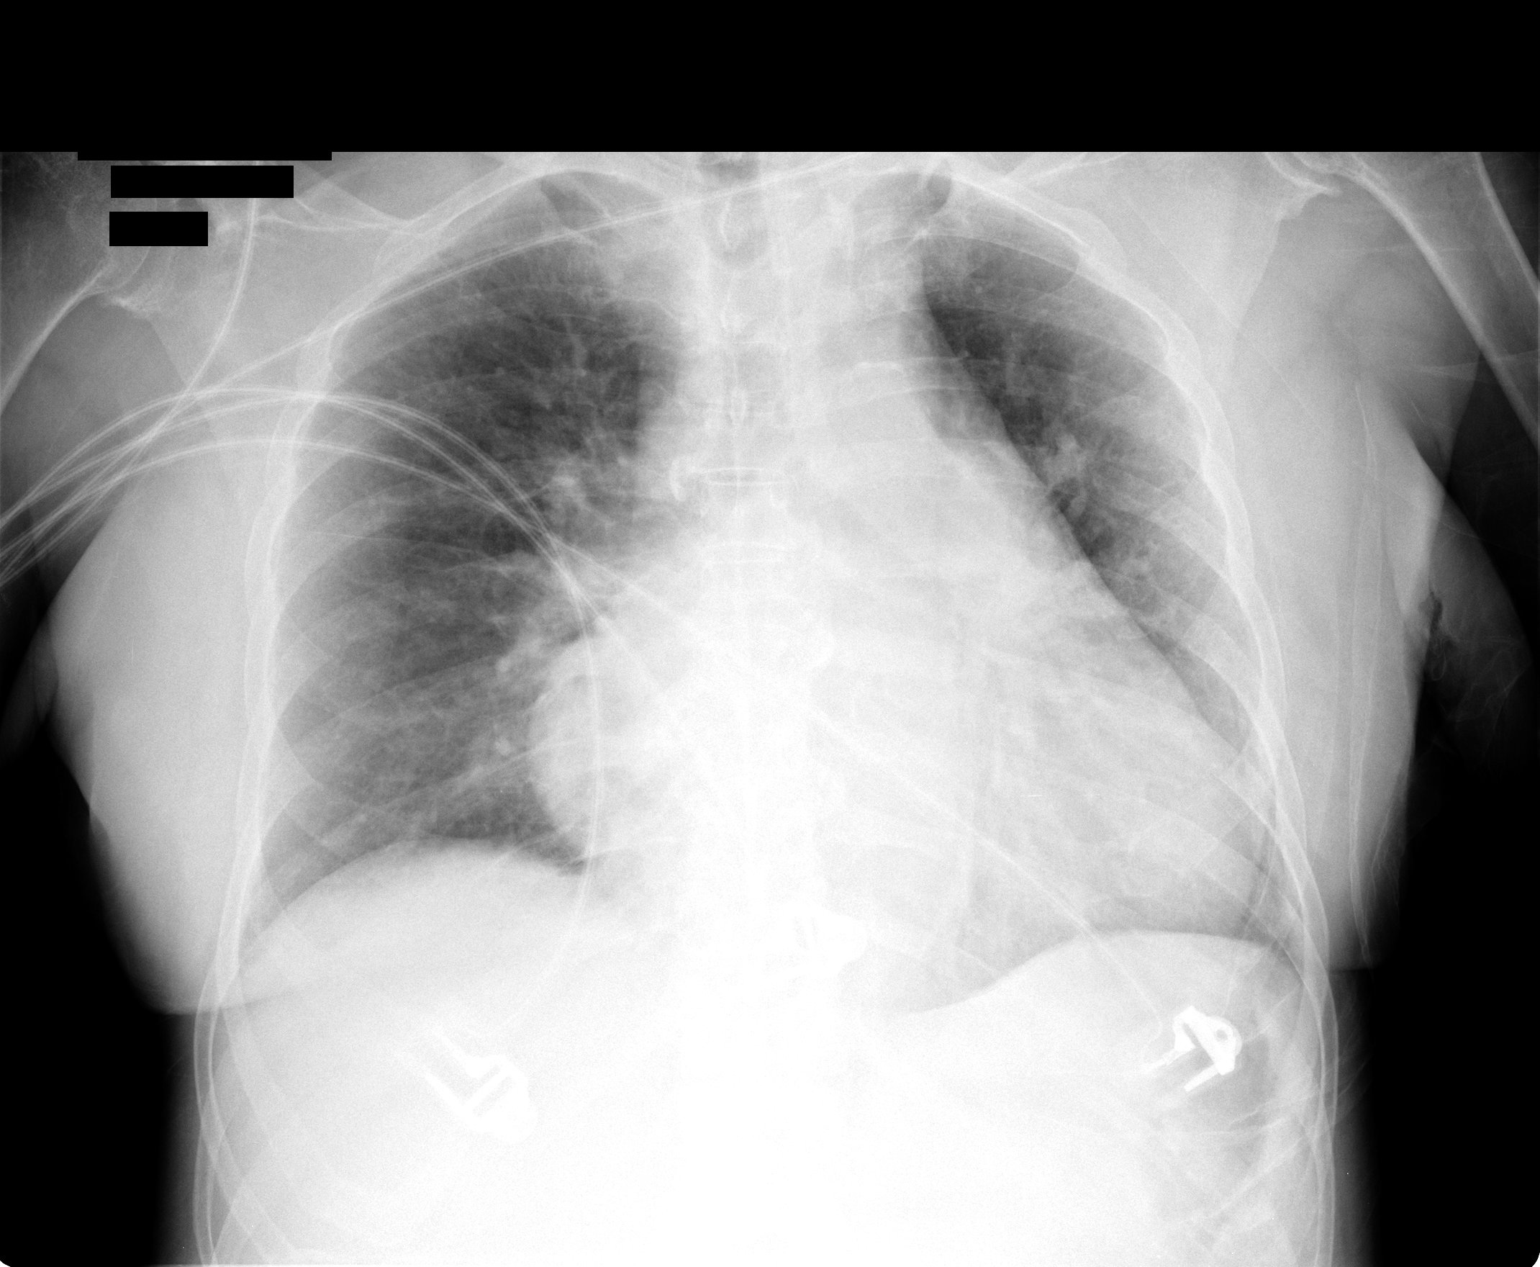

[1 of 1 positions shown; findings below may reference images not displayed]

FINDINGS: The heart size is enlarged.  There are no effusions.  There is pulmonary vascular congestion.  No airspace disease.
IMPRESSION: Cardiomegaly and pulmonary vascular congestion.

## 2008-03-15 IMAGING — CR DG CHEST 1V PORT
1 series · 1 of 1 positions shown · non-contrast
Comparison: 04/24/06.

CLINICAL DATA: Atrial fibrillation.
 PORTABLE CHEST - 1 VIEW:

[view not recorded]
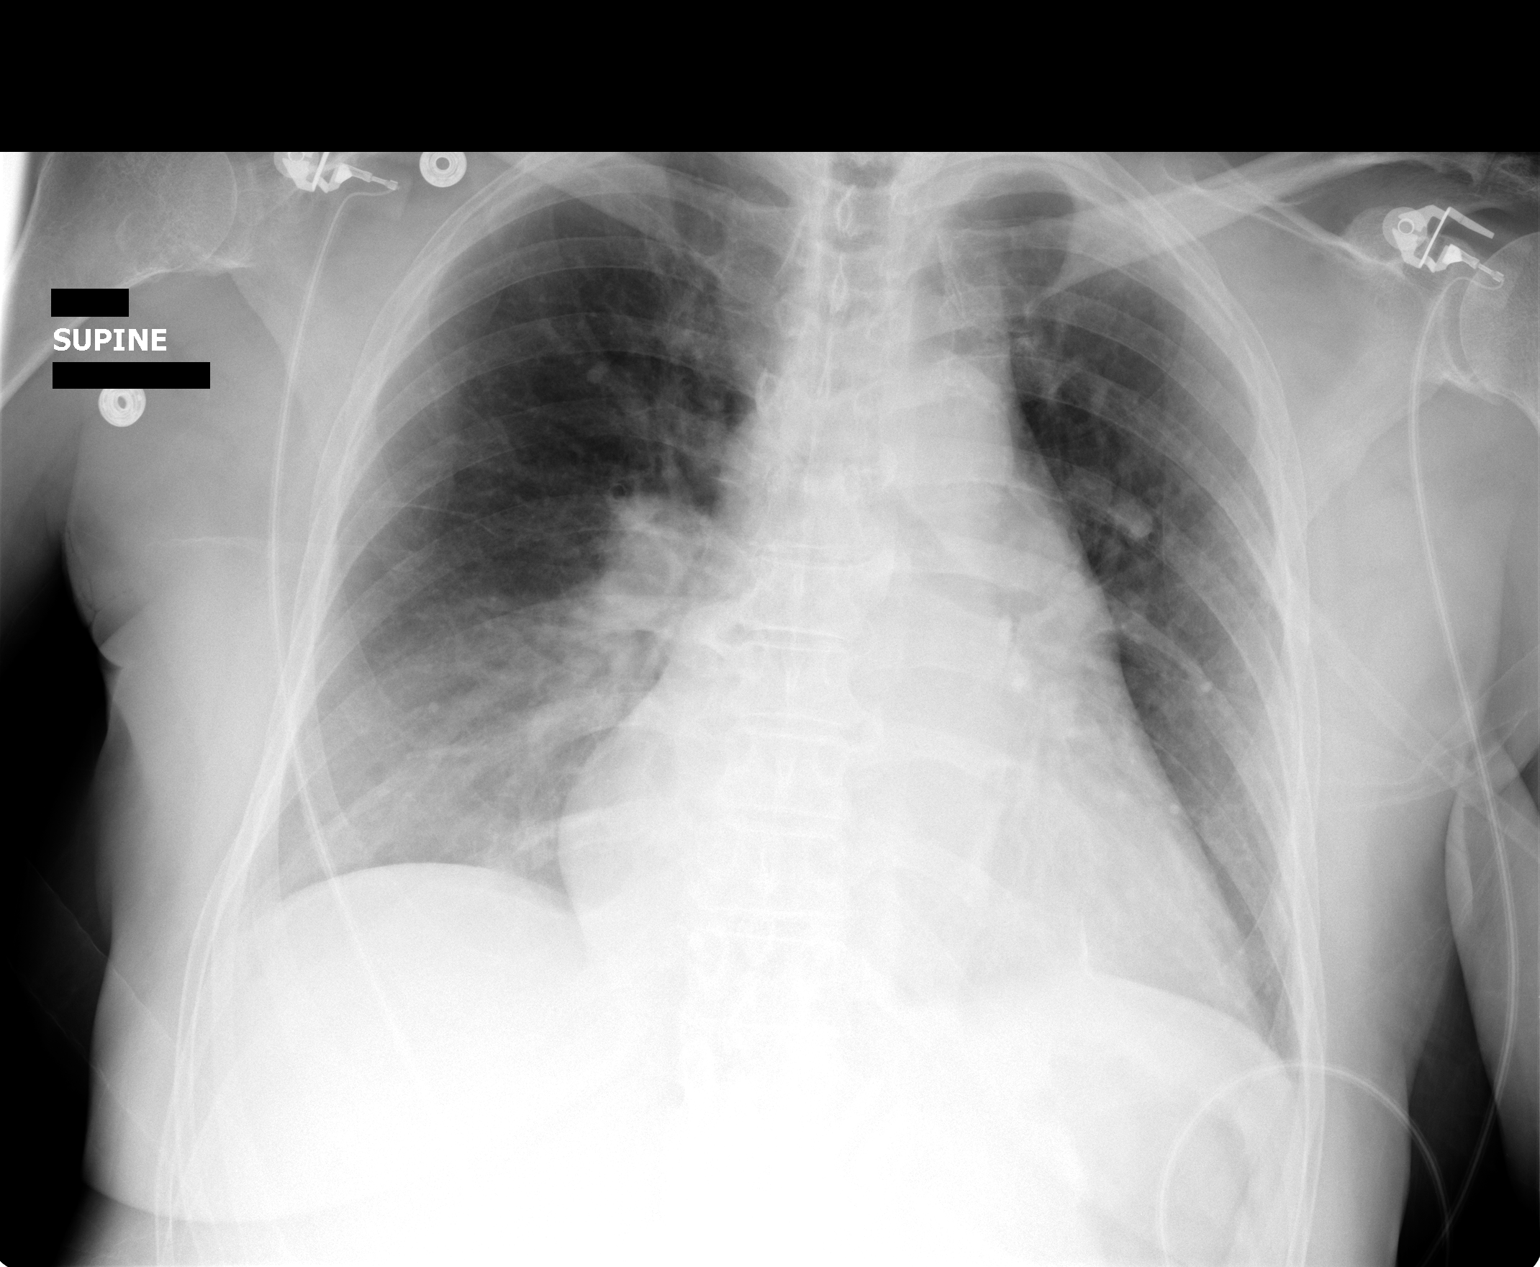

[1 of 1 positions shown; findings below may reference images not displayed]

FINDINGS: Again seen is cardiomegaly.  Vascular congestion has improved.  Hazy opacity over the right lower chest may reflect small layering effusion.  There is some left base atelectasis.
IMPRESSION: Cardiomegaly with improved vascular congestion with a right effusion noted.

## 2008-03-16 IMAGING — CR DG CHEST 1V PORT
1 series · 1 of 1 positions shown · non-contrast
Comparison: 04/25/06

CLINICAL DATA: 75-year-old CHF, atrial fibrillation.
PORTABLE [id number] VIEW:

[view not recorded]
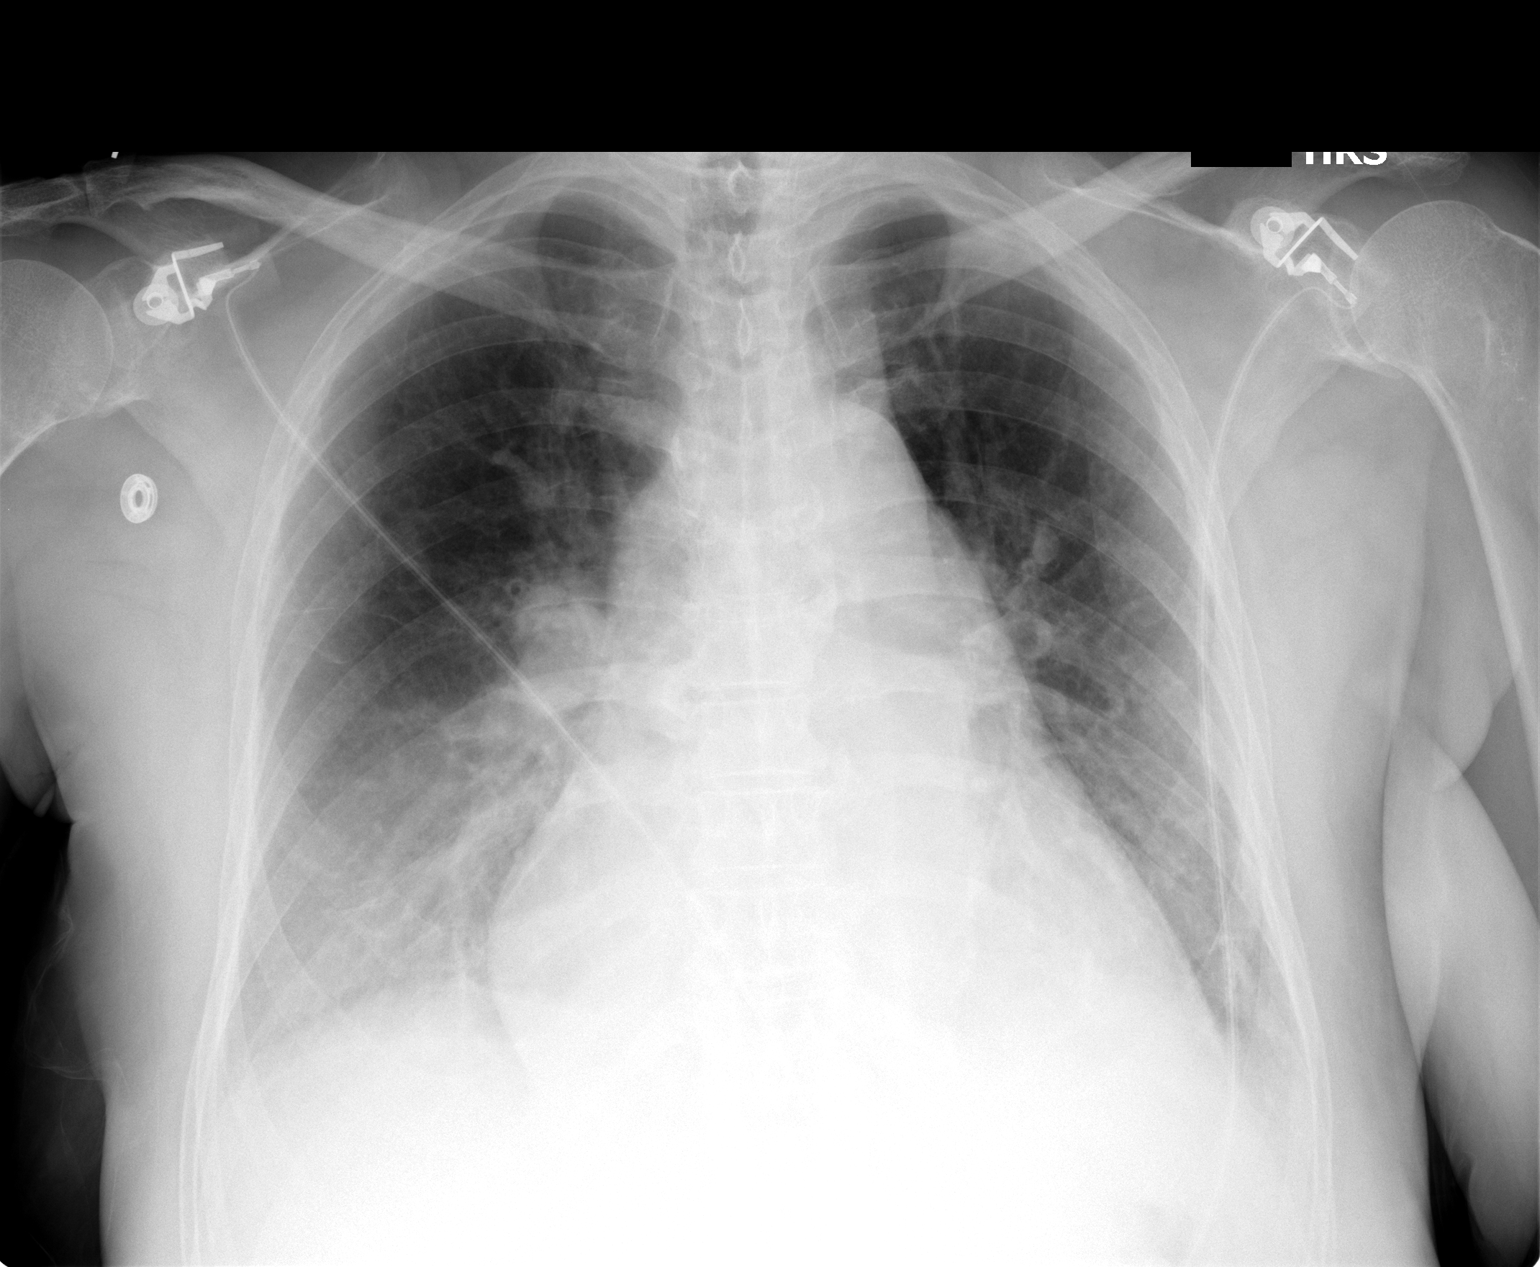

[1 of 1 positions shown; findings below may reference images not displayed]

FINDINGS: The heart remains enlarged with vascular congestion and worsening lower lobe aeration with bibasilar atelectasis.  Dependent effusions are not excluded.  No pneumothorax.
IMPRESSION: Stable cardiomegaly and vascular congestion with worsening bibasilar atelectasis.

## 2008-04-06 ENCOUNTER — Ambulatory Visit: Payer: Self-pay | Admitting: Vascular Surgery

## 2008-06-03 ENCOUNTER — Encounter: Admission: RE | Admit: 2008-06-03 | Discharge: 2008-06-03 | Payer: Self-pay | Admitting: Cardiothoracic Surgery

## 2008-06-03 ENCOUNTER — Ambulatory Visit: Payer: Self-pay | Admitting: Cardiothoracic Surgery

## 2008-08-24 ENCOUNTER — Ambulatory Visit: Payer: Self-pay | Admitting: Oncology

## 2008-09-28 IMAGING — CT CT ANGIO CHEST
3 of 5 series · 18 of 46 positions shown · IV contrast (APPLIED)
Comparison: 02/12/06.

CLINICAL DATA: History of thoracic aortic aneurysm.  Increasing shortness of breath and weakness with chest pressure.  History of colon cancer.   
 CT ANGIOGRAPHY OF CHEST:
TECHNIQUE: Multidetector CT imaging of the chest was performed during bolus injection of intravenous contrast.  Multiplanar CT angiographic image reconstructions were generated to evaluate the vascular anatomy.
 Contrast:  100 cc Omnipaque 300. 
 The patient has a history of contrast allergy.  The patient was given a 13-hour premedication regimen and had no adverse effects from the contrast at the time of the current exam.  
 Scans were performed following intravenous injection of 100 cc of Omnipaque 300.
TECHNIQUE: Multidetector CT imaging of the abdomen was performed during bolus injection of intravenous contrast.  Multiplanar CT angiographic image reconstructions were generated to evaluate the vascular anatomy.
 Contrast:  100 cc Omnipaque 300.

[Series 5: dissection 2.0 st · axial · 0.83mm/px · z∈[+900,+1254]mm · 13 of 207 slices shown]
[im 15/207  lung]
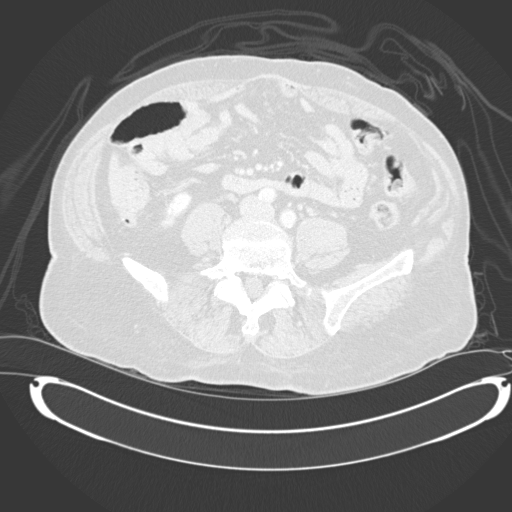
[im 30/207  soft-tissue]
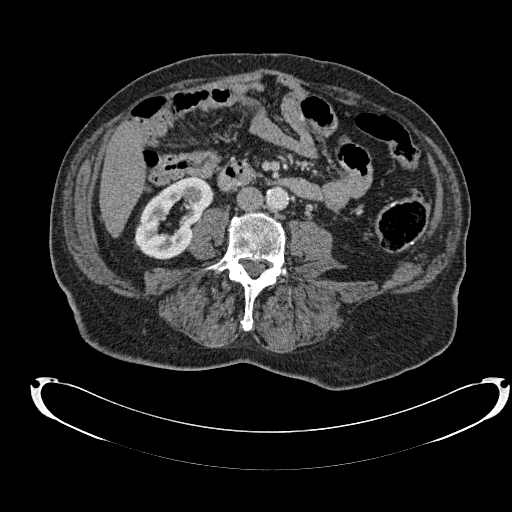
[im 45/207  lung]
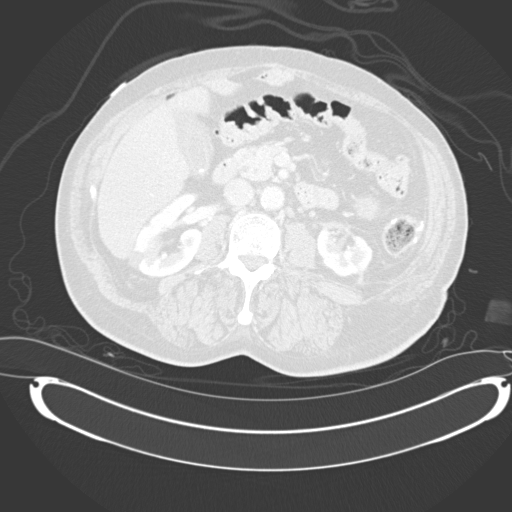
[im 59/207  soft-tissue]
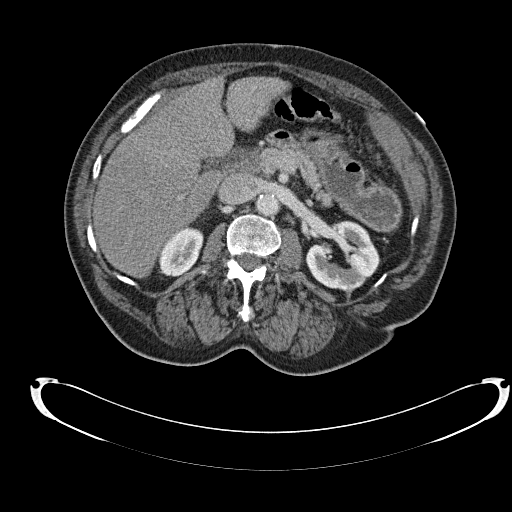
[im 74/207  lung]
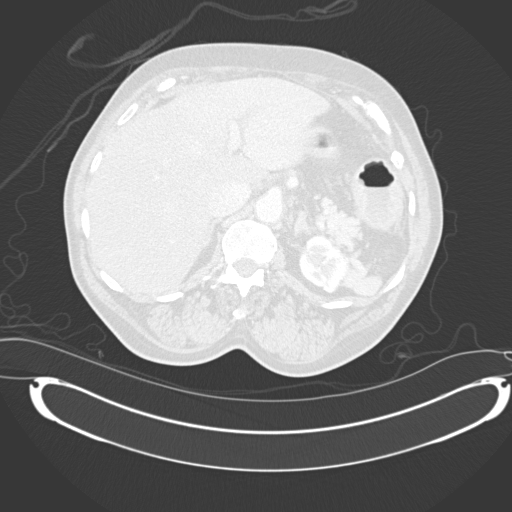
[im 89/207  soft-tissue]
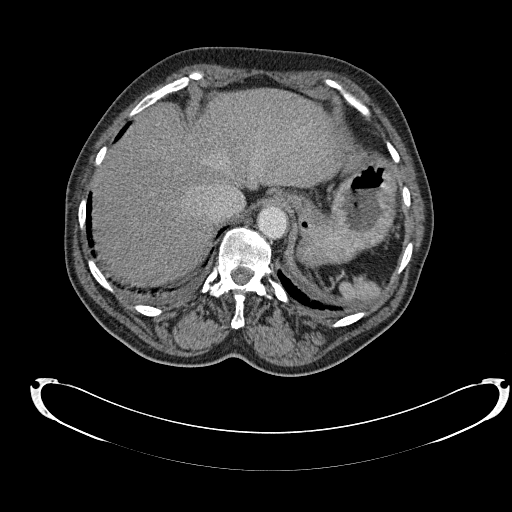
[im 104/207  lung]
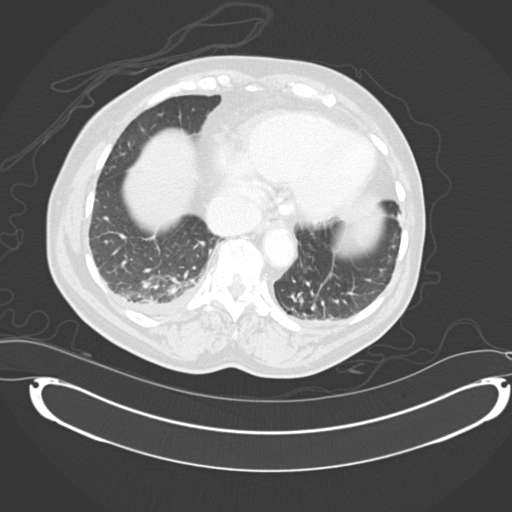
[im 118/207  soft-tissue]
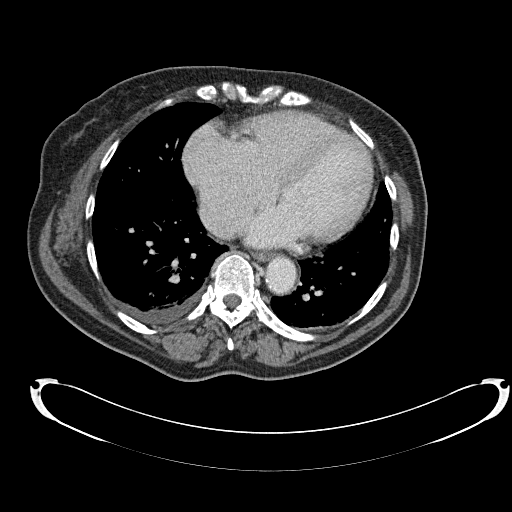
[im 133/207  lung]
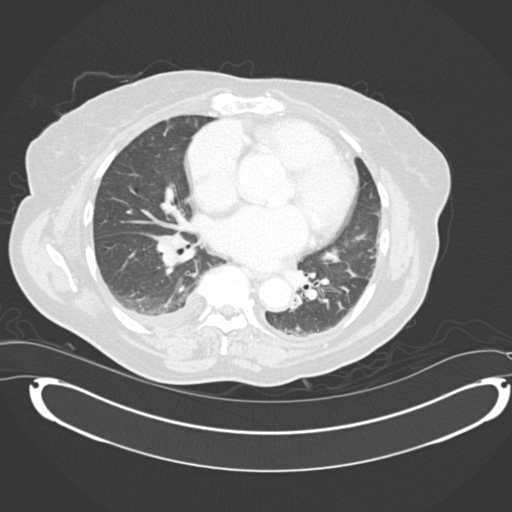
[im 148/207  soft-tissue]
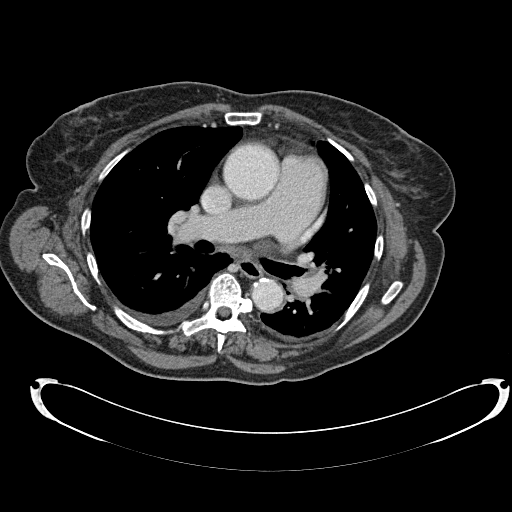
[im 162/207  lung]
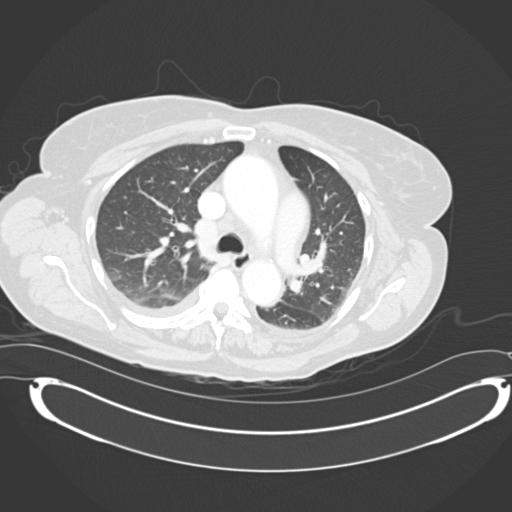
[im 177/207  soft-tissue]
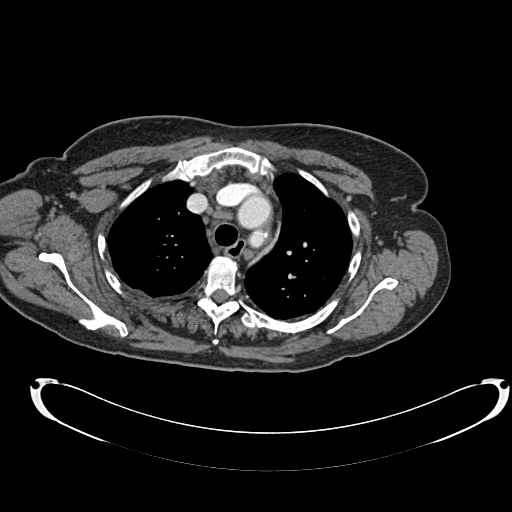
[im 192/207  lung]
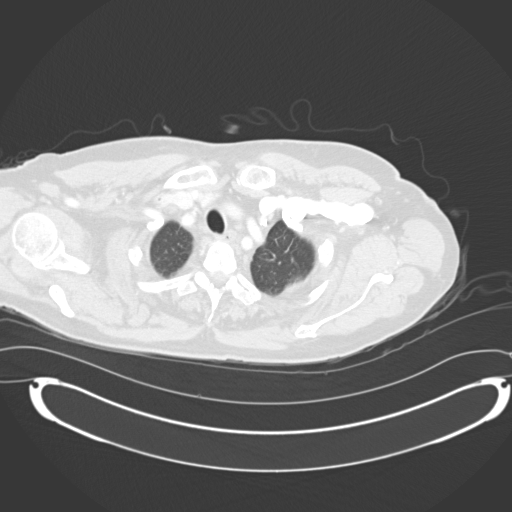

[Series 6: dissection 2.0 lung · axial · 0.83mm/px · z∈[+1018,+1048]mm · 2 of 148 slices shown]
[im 15/148  soft-tissue]
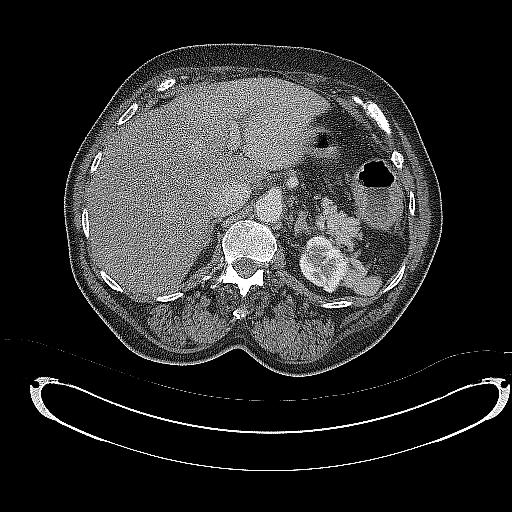
[im 30/148  soft-tissue]
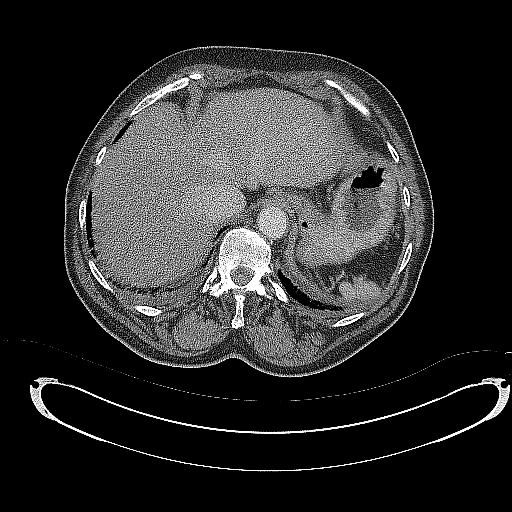

[Series 602: sagittal · coronal · 0.83mm/px · 3 of 134 slices shown]
[im 45/134  soft-tissue]
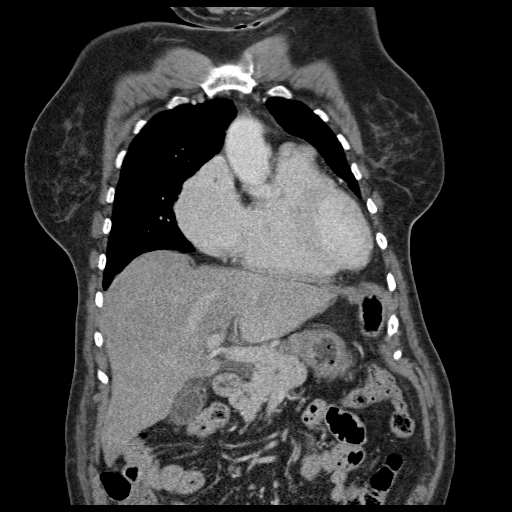
[im 60/134  soft-tissue]
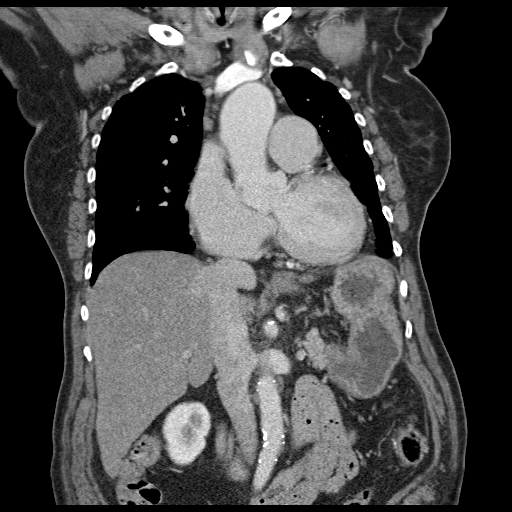
[im 74/134  soft-tissue]
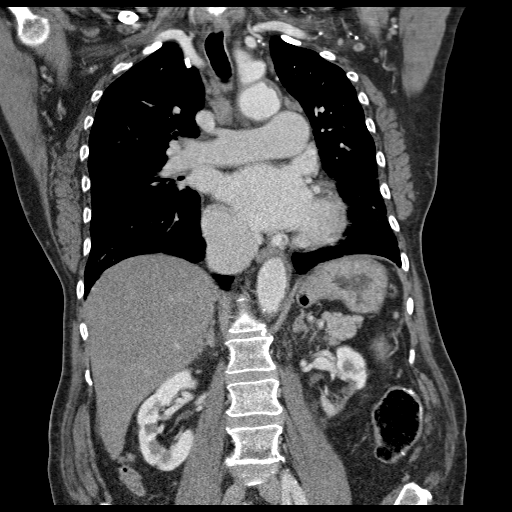

[18 of 46 positions shown; findings below may reference images not displayed]

FINDINGS: This scan demonstrates dilatation of the ascending thoracic aorta to a maximum diameter of 4.5 cm essentially unchanged since the prior study.   There is chronic cardiomegaly.  There is only very minimal coronary artery calcification.  There is a small right pleural effusion with some minimal atelectasis at the lung bases.  There is some stable mild adenopathy in the azygos region as well as to the left of the aortic arch.  The patient has a congenital variant of common origin of the innominate artery of the left carotid artery.  There is no evidence of aortic dissection.  The descending thoracic aorta is essentially normal except for calcification.  
 There are mild degenerative changes in the thoracic spine, particularly at the costovertebral joints.  No acute compression fractures.
IMPRESSION: Small right pleural effusion, new since 02/12/06.  Chronic cardiomegaly with chronic slight dilatation of the ascending thoracic aorta.   Stable adenopathy in the mediastinum. 
 CT ANGIOGRAPHY OF ABDOMEN:
FINDINGS: There is no evidence of abdominal aortic aneurysm or dissection.  The patient has developed edema of the gallbladder wall and has a single calcified stone in the gallbladder.  This edema of the wall is new since the prior study.    Does the patient have symptoms of cholecystitis?  There is fatty infiltration of the liver.  The patient has a few small remnants of splenic tissue with __no normal appearing spleen.   Has the patient had a previous splenectomy?  
 The pancreas and adrenal glands are normal.  The left kidney is somewhat lobulated and this suggests either prior infections or infarctions.   There is 15 mm cyst on the lateral aspect of the upper pole of the right kidney.  No adenopathy.  No dilated bowel.
IMPRESSION: The patient has developed edema of the gallbladder wall since the prior exam and has a single calcified gallstone which was present previously.  This could represent acute or chronic cholecystitis.  No other significant abnormality.

## 2008-09-28 IMAGING — CR DG CHEST 1V PORT
1 series · 1 of 1 positions shown · non-contrast
Comparison: 04/26/06.

CLINICAL DATA: Dyspnea and chest pain for 2 months.
 PORTABLE CHEST - 1 VIEW (9252 hours):

[view not recorded]
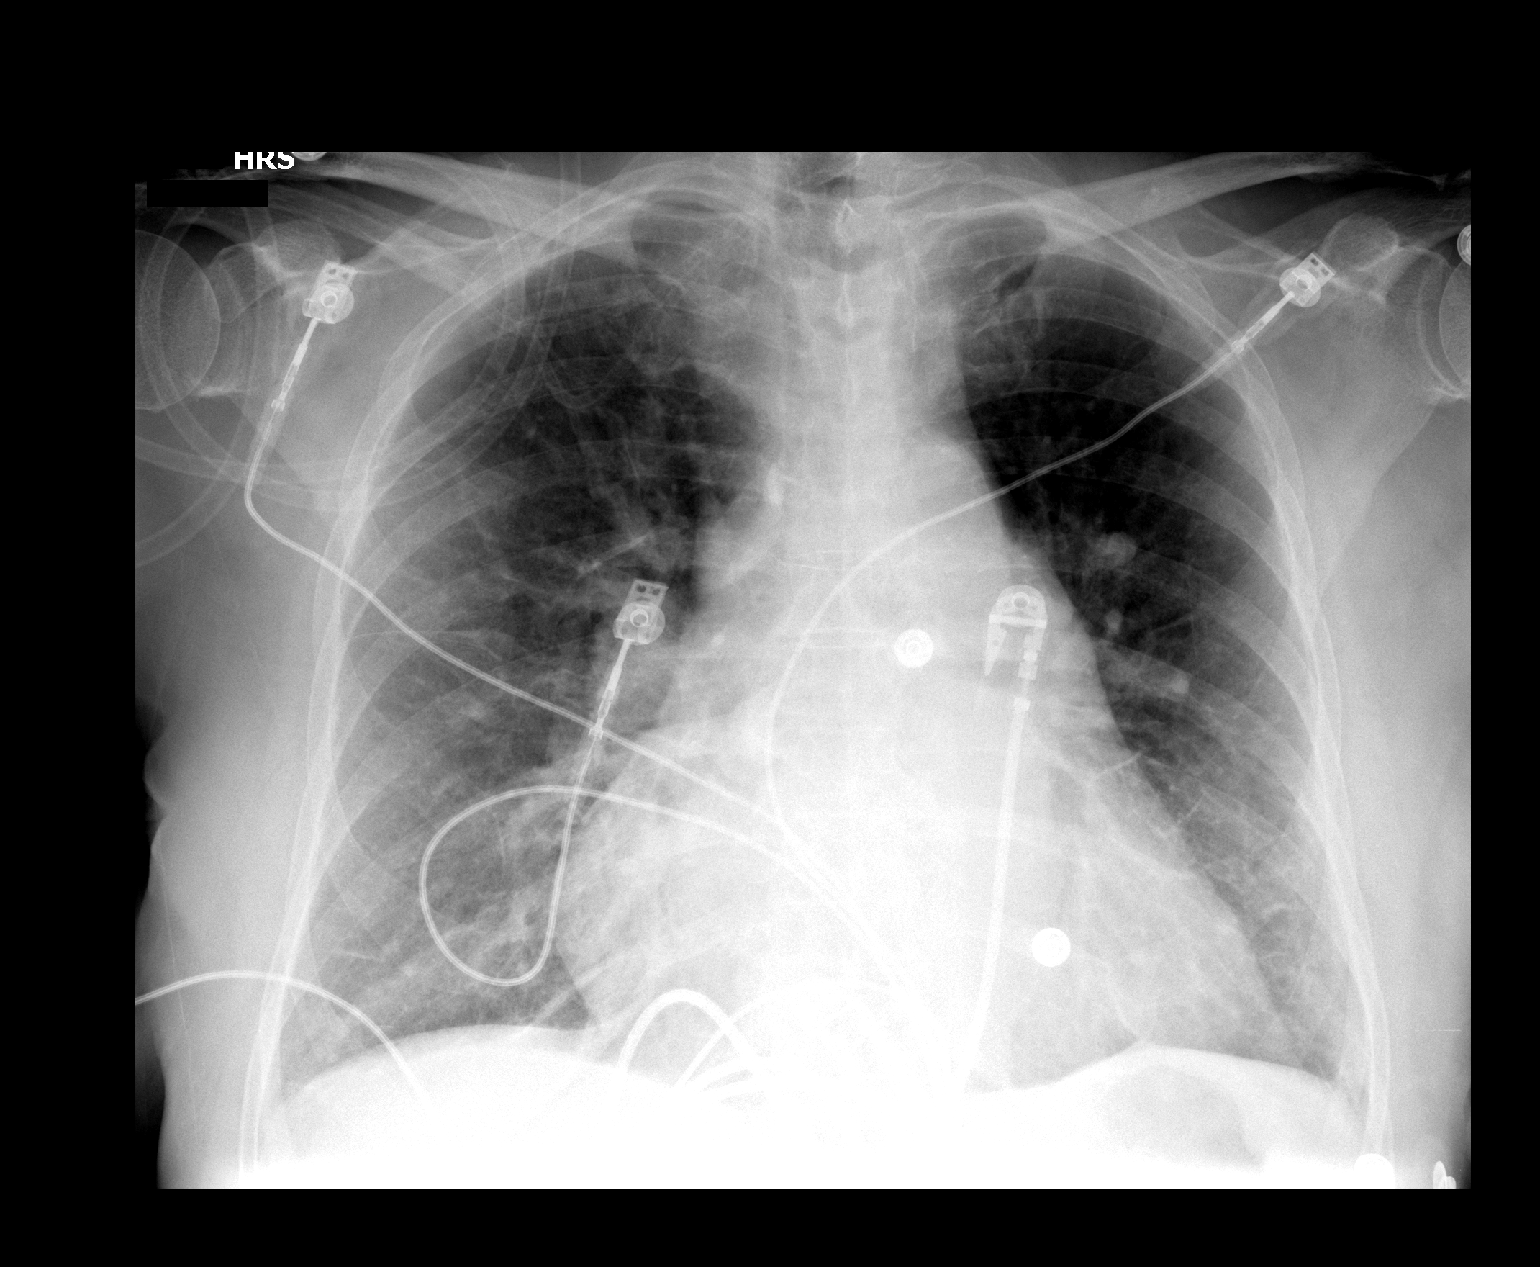

[1 of 1 positions shown; findings below may reference images not displayed]

FINDINGS: There is stable cardiomegaly with chronic vascular congestion.  Edema and bibasilar atelectasis noted previously have resolved.  There is no pleural effusion or pneumothorax.
IMPRESSION: Cardiomegaly with stable chronic vascular congestion.  No residual edema.

## 2008-10-13 LAB — HM COLONOSCOPY

## 2008-12-26 ENCOUNTER — Inpatient Hospital Stay (HOSPITAL_COMMUNITY): Admission: EM | Admit: 2008-12-26 | Discharge: 2008-12-28 | Payer: Self-pay | Admitting: Emergency Medicine

## 2008-12-27 ENCOUNTER — Encounter (INDEPENDENT_AMBULATORY_CARE_PROVIDER_SITE_OTHER): Payer: Self-pay | Admitting: Cardiovascular Disease

## 2009-01-21 ENCOUNTER — Inpatient Hospital Stay (HOSPITAL_COMMUNITY): Admission: EM | Admit: 2009-01-21 | Discharge: 2009-01-23 | Payer: Self-pay | Admitting: Emergency Medicine

## 2009-02-12 ENCOUNTER — Emergency Department (HOSPITAL_COMMUNITY): Admission: EM | Admit: 2009-02-12 | Discharge: 2009-02-12 | Payer: Self-pay | Admitting: Emergency Medicine

## 2009-04-11 IMAGING — CT CT CHEST W/O CM
1 of 2 series · 15 of 32 positions shown, 19 images · IV contrast (agent unspecified)
Comparison: CT 11/08/06.

CLINICAL DATA: Thoracic aortic aneurysm.  
 CHEST CT WITHOUT CONTRAST:
TECHNIQUE: Multidetector CT imaging of the chest was performed following the standard protocol without IV contrast.

[Series 3: routine chest · axial · 0.70mm/px · z∈[-260,+20]mm · 15 of 62 slices shown, 19 images]
[im 3/62  mediastinal]
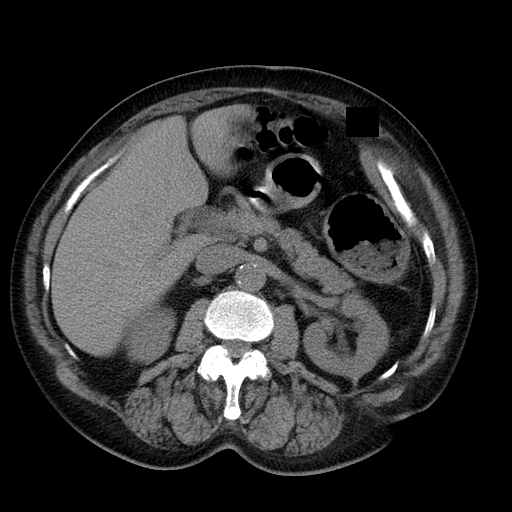
[im 3/62  lung]
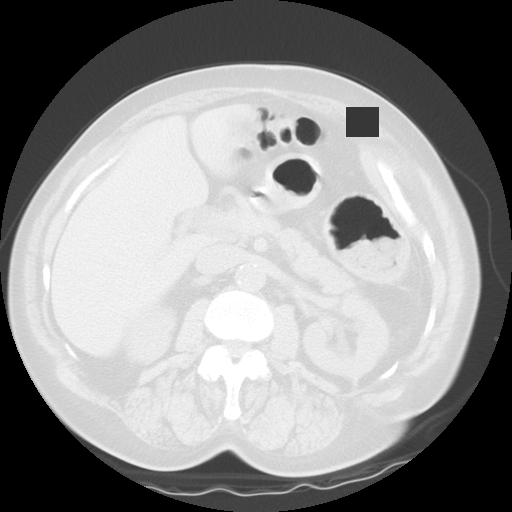
[im 7/62  lung]
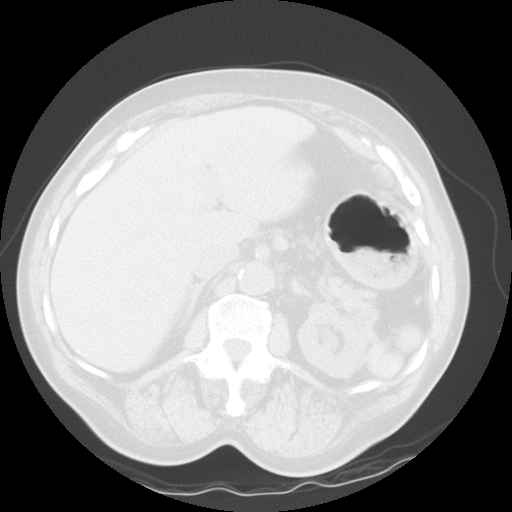
[im 12/62  lung]
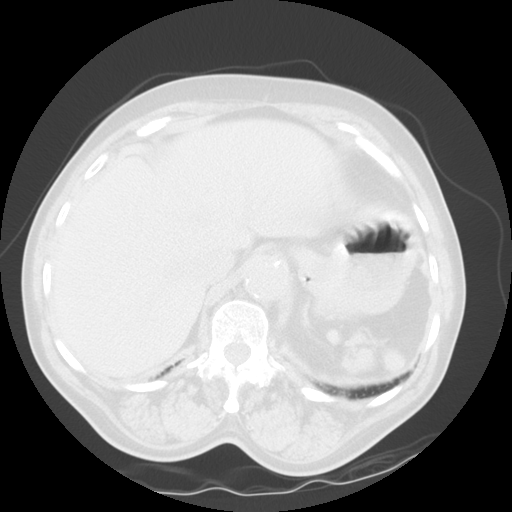
[im 16/62  lung]
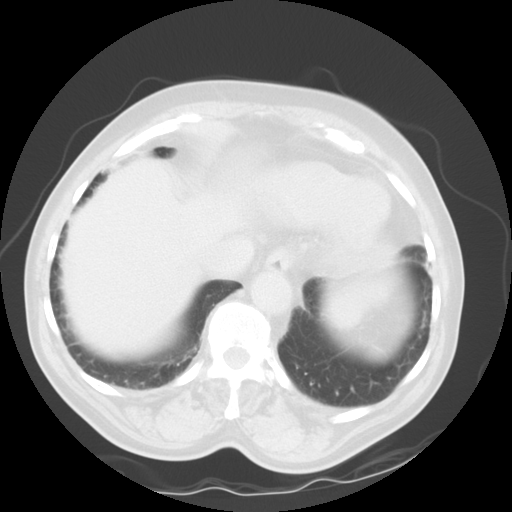
[im 19/62  mediastinal]
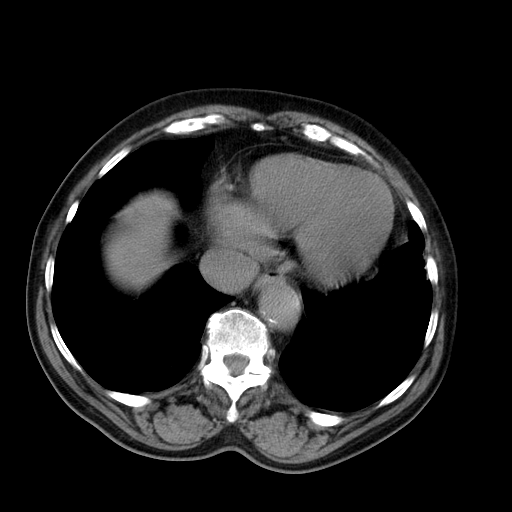
[im 19/62  lung]
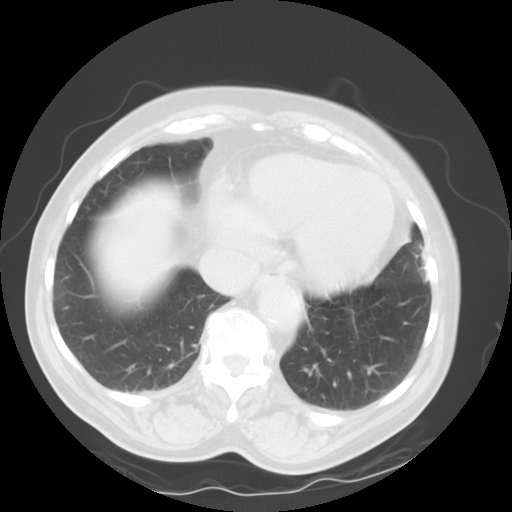
[im 23/62  lung]
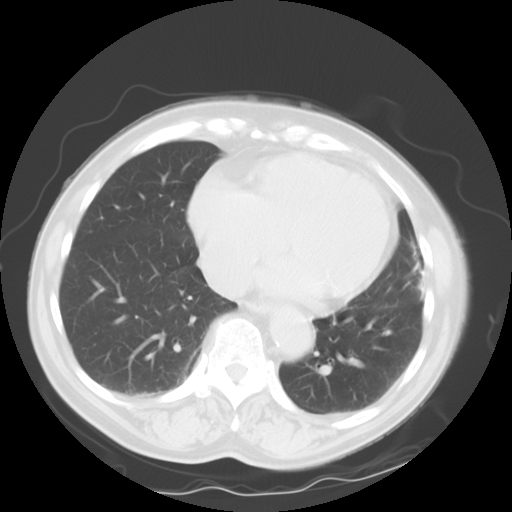
[im 28/62  lung]
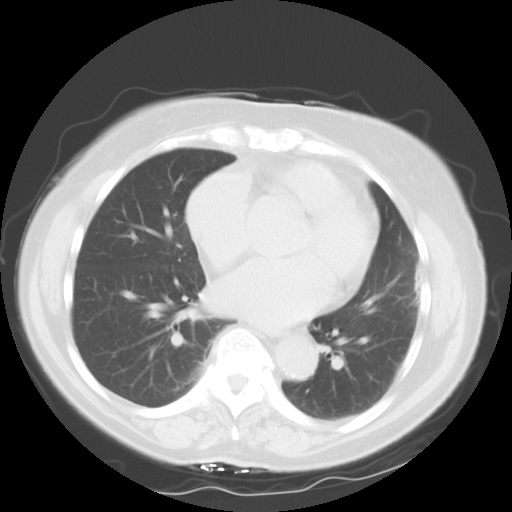
[im 32/62  lung]
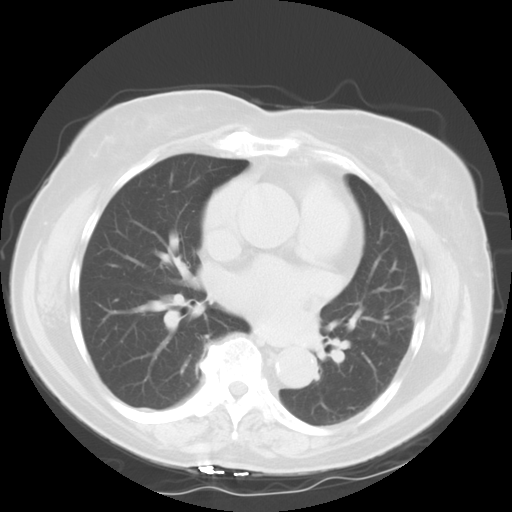
[im 34/62  mediastinal]
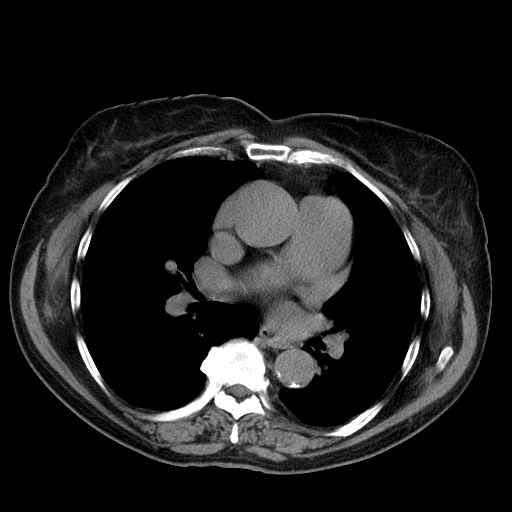
[im 34/62  lung]
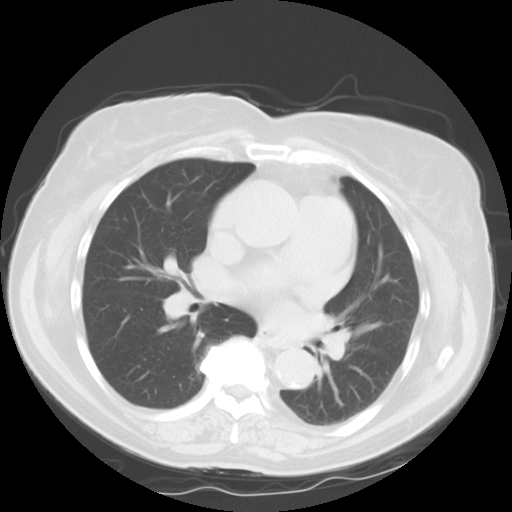
[im 39/62  lung]
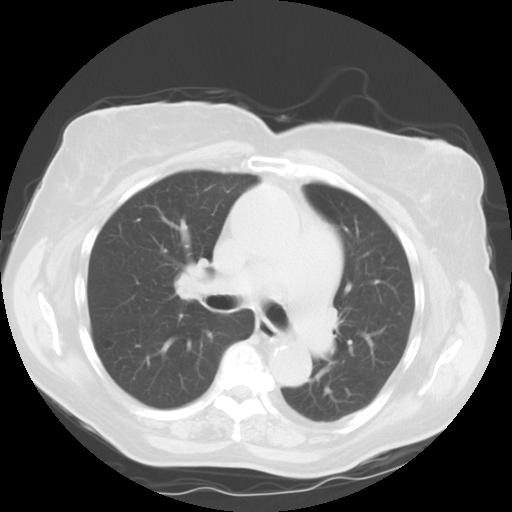
[im 43/62  lung]
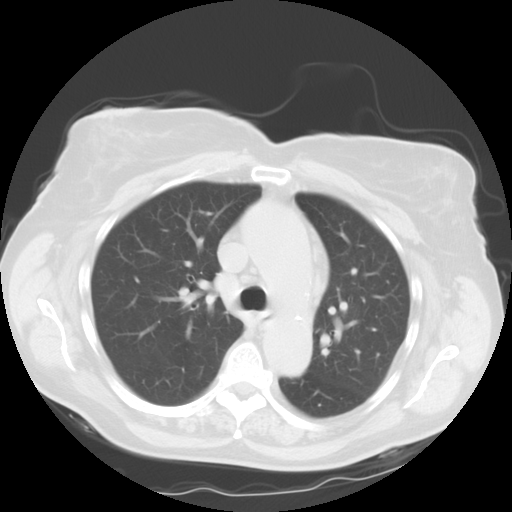
[im 46/62  lung]
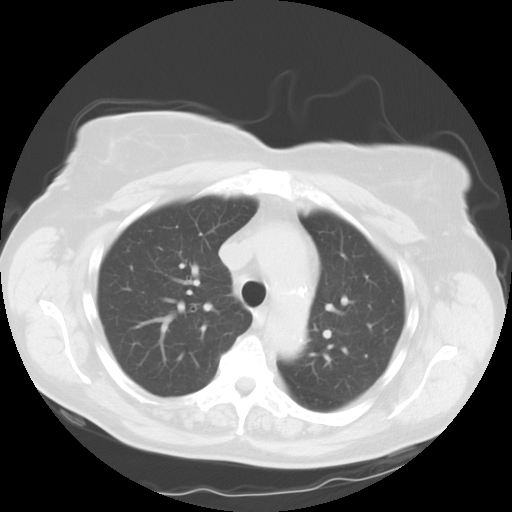
[im 50/62  mediastinal]
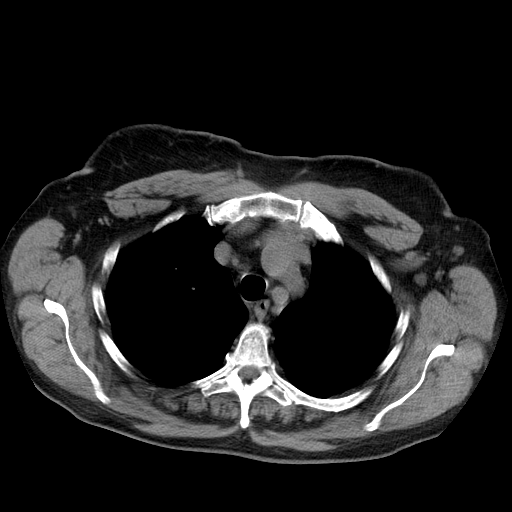
[im 50/62  lung]
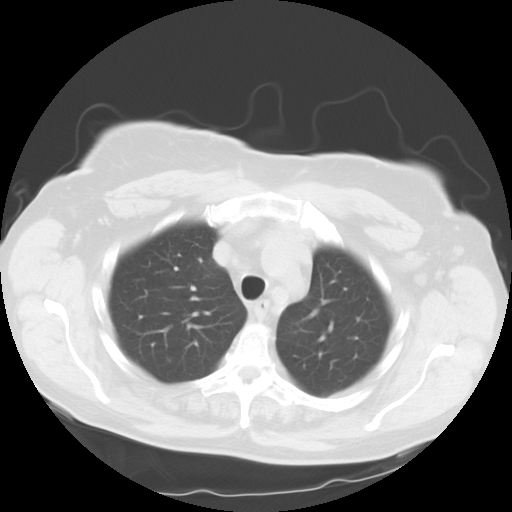
[im 55/62  lung]
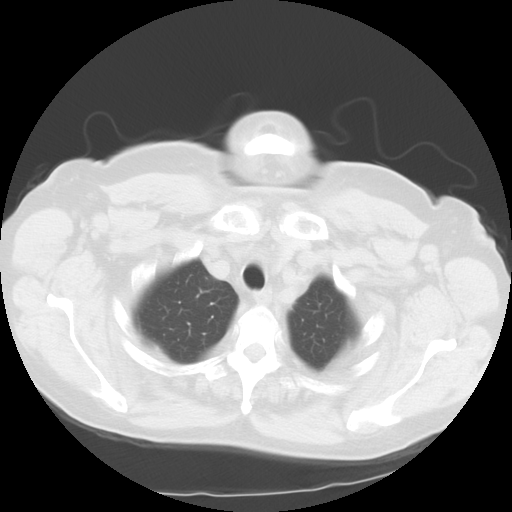
[im 59/62  lung]
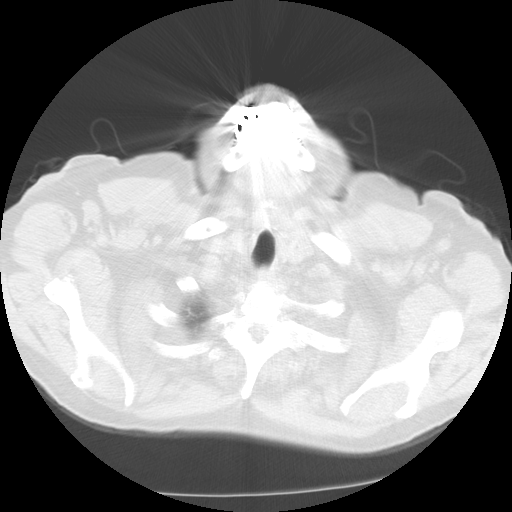

[15 of 32 positions shown; findings below may reference images not displayed]

FINDINGS: There is fusiform dilatation of the ascending aorta measuring up to 4.7 x 4.7 cm.  This is unchanged from the prior study.  The aorta tapers to a normal caliber in the [place] arch and the descending thoracic aorta is normal in caliber.  I cannot evaluate for dissection without intravenous contrast but no dissection was seen on the prior study.
 Small lymph nodes in the anterior mediastinum are improved.  Pretracheal lymph nodes are less than 1 cm and appear improved in size as well.  There is no mass lesion in the lung.  There is some scarring in the lingula.  No lung mass is seen and there is no pleural effusion.  The heart is not enlarged.
IMPRESSION: 1.  Fusiform aneurysm in the ascending aorta beginning at the aortic valve and ending in the aortic arch.  This measures approximately 4.7 x 4.7 cm and is similar to the prior study. 
 2.  Mild mediastinal adenopathy has improved in the interval.

## 2009-07-18 LAB — HM MAMMOGRAPHY

## 2009-08-12 ENCOUNTER — Ambulatory Visit (HOSPITAL_COMMUNITY): Admission: RE | Admit: 2009-08-12 | Discharge: 2009-08-12 | Payer: Self-pay | Admitting: Cardiology

## 2009-11-20 HISTORY — PX: TRANSTHORACIC ECHOCARDIOGRAM: SHX275

## 2009-12-02 ENCOUNTER — Inpatient Hospital Stay (HOSPITAL_COMMUNITY): Admission: EM | Admit: 2009-12-02 | Discharge: 2009-12-05 | Payer: Self-pay | Admitting: Emergency Medicine

## 2009-12-04 ENCOUNTER — Encounter (INDEPENDENT_AMBULATORY_CARE_PROVIDER_SITE_OTHER): Payer: Self-pay | Admitting: Internal Medicine

## 2010-03-13 ENCOUNTER — Emergency Department (HOSPITAL_COMMUNITY): Admission: EM | Admit: 2010-03-13 | Discharge: 2010-03-13 | Payer: Self-pay | Admitting: Emergency Medicine

## 2010-04-24 IMAGING — CT CT CHEST W/O CM
1 series · 16 of 31 positions shown, 20 images · non-contrast
Comparison: 05/22/2007 CT

CLINICAL DATA: Follow up thoracic aortic aneurysm

CT CHEST WITHOUT CONTRAST
TECHNIQUE: Multidetector CT imaging of the chest was performed
following the standard protocol without IV contrast.

[Series 2: routine chest · axial · 0.70mm/px · z∈[-236,+44]mm · 16 of 62 slices shown, 20 images]
[im 3/62  mediastinal]
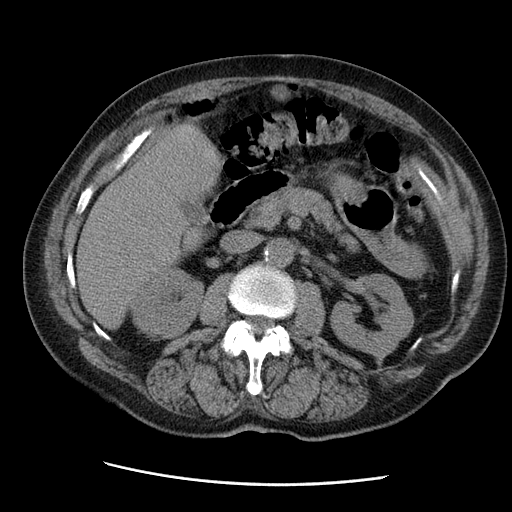
[im 3/62  lung]
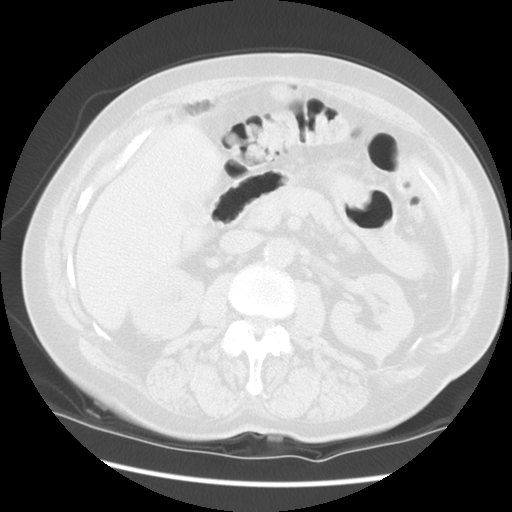
[im 7/62  lung]
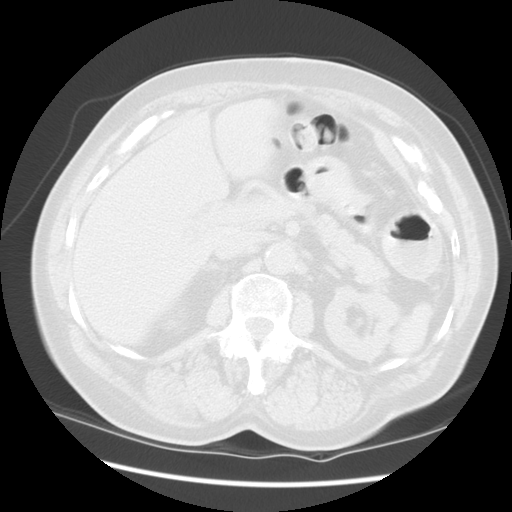
[im 12/62  lung]
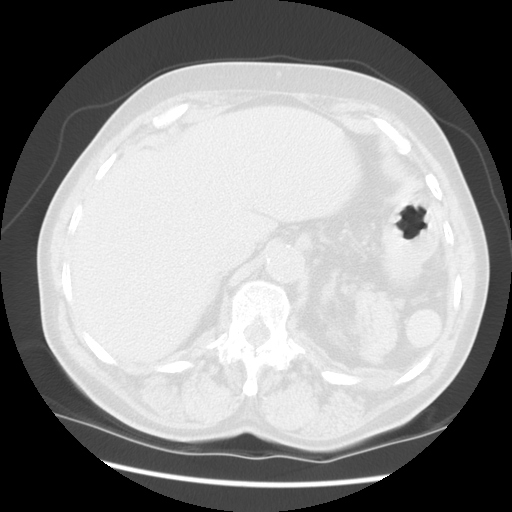
[im 14/62  lung]
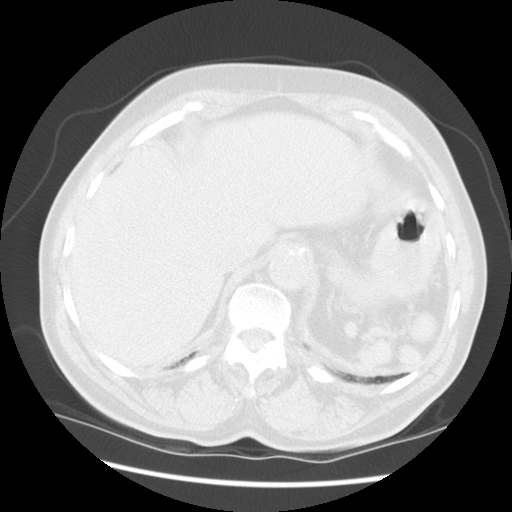
[im 19/62  mediastinal]
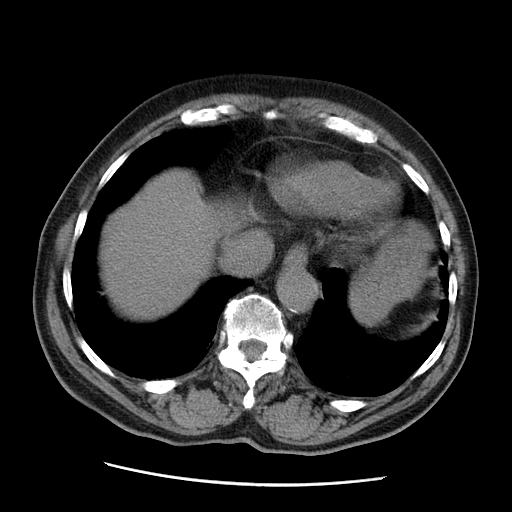
[im 19/62  lung]
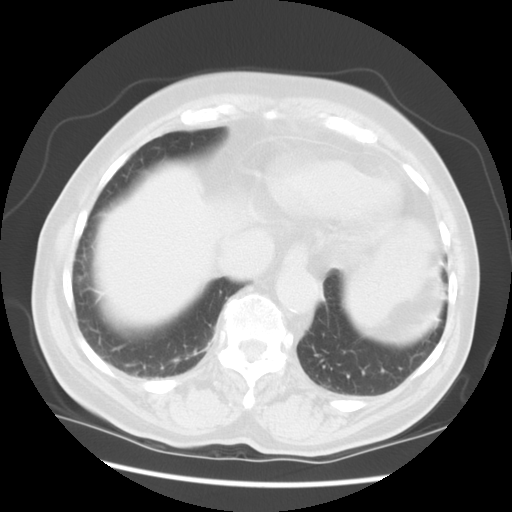
[im 21/62  lung]
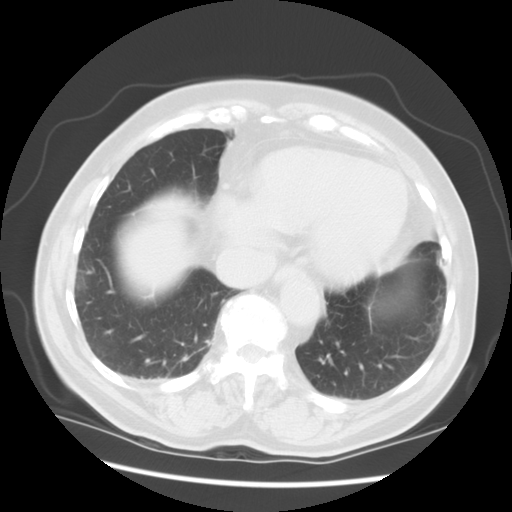
[im 25/62  lung]
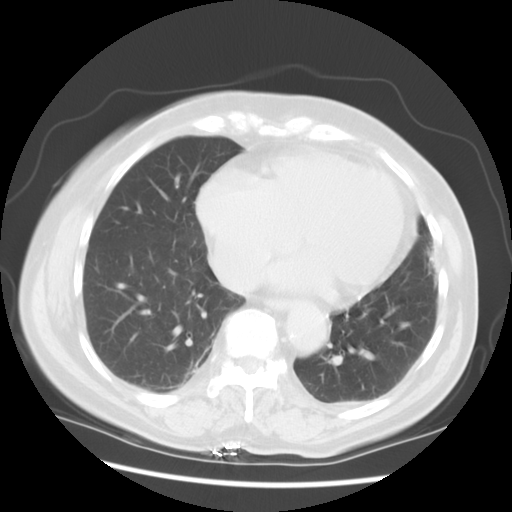
[im 30/62  lung]
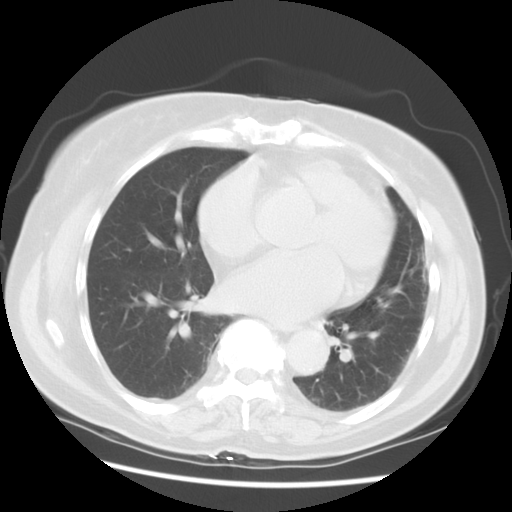
[im 33/62  mediastinal]
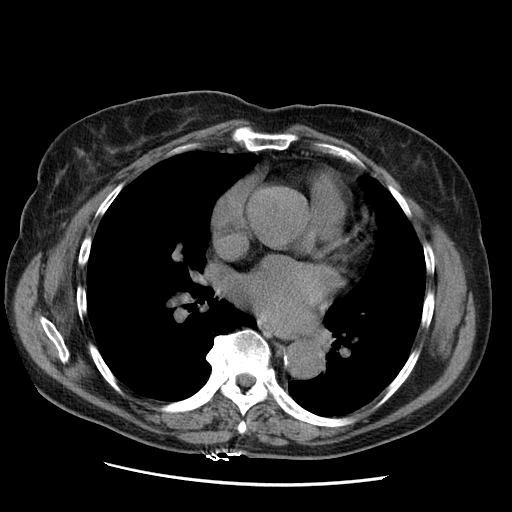
[im 33/62  lung]
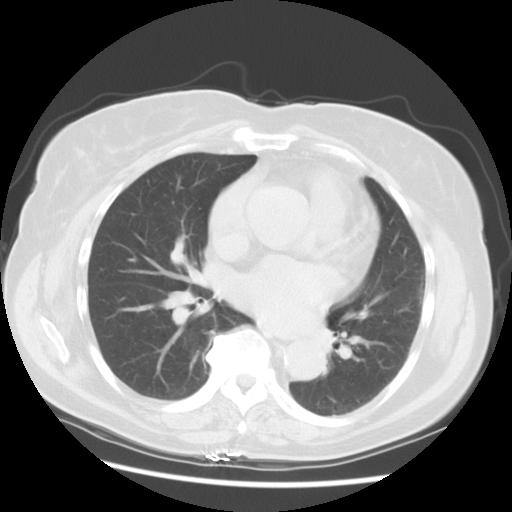
[im 37/62  lung]
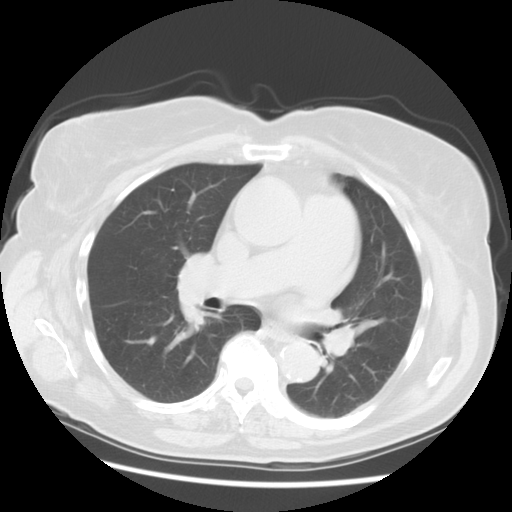
[im 41/62  lung]
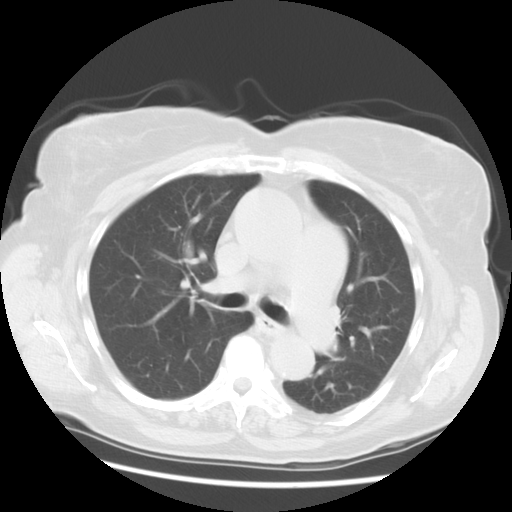
[im 43/62  lung]
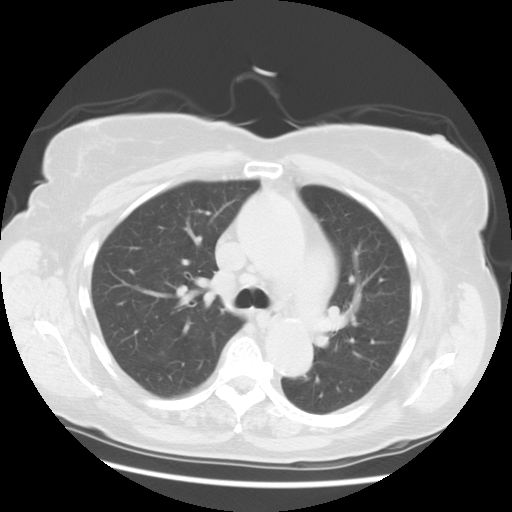
[im 48/62  mediastinal]
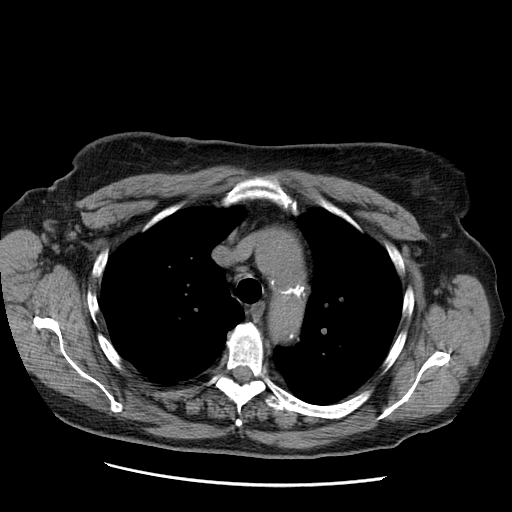
[im 48/62  lung]
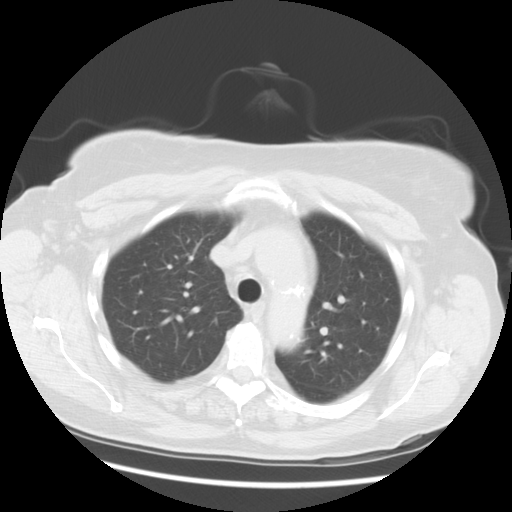
[im 50/62  lung]
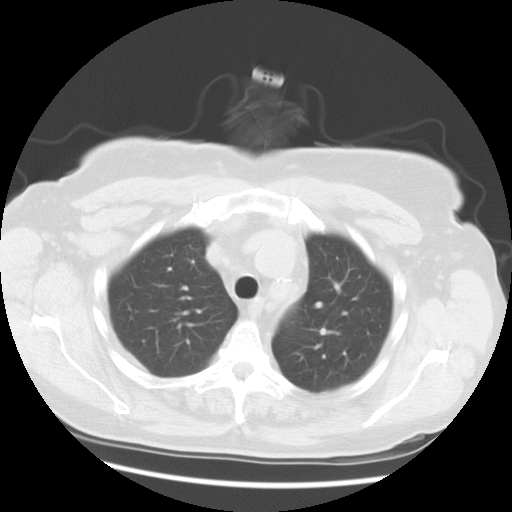
[im 55/62  lung]
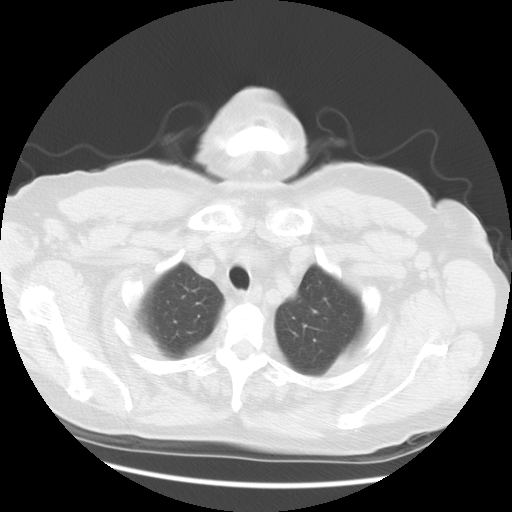
[im 59/62  lung]
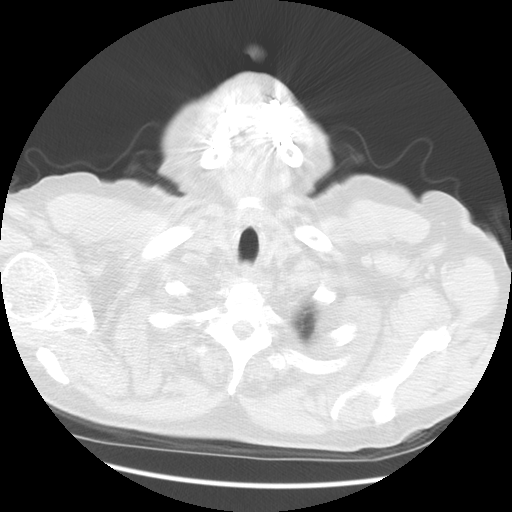

[16 of 31 positions shown; findings below may reference images not displayed]

FINDINGS: Stable fusiform dilatation of the ascending aorta.
Ascending aorta measures 4.7 x 4.9 cm in maximal transverse
dimensions compared to 4.7 x 4.8 cm on the prior exam. No aortic
valve calcifications.  Dilatation of the main and central pulmonary
arteries.  Pulmonary artery trunk measures 4.5 cm.  No
pathologically enlarged lymph nodes.  Slightly prominent sized but
not pathologically enlarged pretracheal, right paratracheal, and
prevascular lymph nodes.  No active pulmonary parenchymal
abnormality.  No pleural or pericardial effusions.

No evidence of normal appearing spleen.  Splenic remnants are noted
in the left upper quadrant.
IMPRESSION: Stable ascending aortic aneurysm.  Prominent caliber of the
pulmonary arteries raises the question of pulmonary arterial
hypertension.

## 2010-05-24 ENCOUNTER — Ambulatory Visit: Payer: Self-pay | Admitting: Oncology

## 2010-05-26 LAB — COMPREHENSIVE METABOLIC PANEL
AST: 24 U/L (ref 0–37)
Albumin: 4.6 g/dL (ref 3.5–5.2)
Alkaline Phosphatase: 110 U/L (ref 39–117)
Potassium: 4.6 mEq/L (ref 3.5–5.3)
Sodium: 140 mEq/L (ref 135–145)
Total Protein: 7.2 g/dL (ref 6.0–8.3)

## 2010-05-26 LAB — CBC WITH DIFFERENTIAL/PLATELET
BASO%: 0.4 % (ref 0.0–2.0)
EOS%: 1 % (ref 0.0–7.0)
MCH: 29.1 pg (ref 25.1–34.0)
MCV: 87.3 fL (ref 79.5–101.0)
MONO%: 11.4 % (ref 0.0–14.0)
NEUT#: 3.4 10*3/uL (ref 1.5–6.5)
RBC: 4.36 10*6/uL (ref 3.70–5.45)
RDW: 16.1 % — ABNORMAL HIGH (ref 11.2–14.5)

## 2010-08-13 ENCOUNTER — Encounter: Payer: Self-pay | Admitting: Cardiothoracic Surgery

## 2010-10-09 LAB — BASIC METABOLIC PANEL
CO2: 32 mEq/L (ref 19–32)
Calcium: 9.1 mg/dL (ref 8.4–10.5)
Calcium: 9.1 mg/dL (ref 8.4–10.5)
Creatinine, Ser: 1.28 mg/dL — ABNORMAL HIGH (ref 0.4–1.2)
Creatinine, Ser: 1.36 mg/dL — ABNORMAL HIGH (ref 0.4–1.2)
GFR calc non Af Amer: 41 mL/min — ABNORMAL LOW (ref >60)
Glucose, Bld: 121 mg/dL — ABNORMAL HIGH (ref 70–99)
Glucose, Bld: 97 mg/dL (ref 70–99)
Sodium: 136 mEq/L (ref 135–145)

## 2010-10-09 LAB — GLUCOSE, CAPILLARY
Glucose-Capillary: 126 mg/dL — ABNORMAL HIGH (ref 70–99)
Glucose-Capillary: 130 mg/dL — ABNORMAL HIGH (ref 70–99)
Glucose-Capillary: 177 mg/dL — ABNORMAL HIGH (ref 70–99)
Glucose-Capillary: 190 mg/dL — ABNORMAL HIGH (ref 70–99)

## 2010-10-10 LAB — CARDIAC PANEL(CRET KIN+CKTOT+MB+TROPI)
CK, MB: 2.3 ng/mL (ref 0.3–4.0)
CK, MB: 2.6 ng/mL (ref 0.3–4.0)
Total CK: 69 U/L (ref 7–177)
Troponin I: 0.02 ng/mL (ref 0.00–0.06)

## 2010-10-10 LAB — DIFFERENTIAL
Basophils Relative: 0 % (ref 0–1)
Eosinophils Absolute: 0.1 10*3/uL (ref 0.0–0.7)
Eosinophils Relative: 1 % (ref 0–5)
Lymphs Abs: 1.6 10*3/uL (ref 0.7–4.0)
Monocytes Absolute: 0.3 10*3/uL (ref 0.1–1.0)
Monocytes Relative: 4 % (ref 3–12)
Neutrophils Relative %: 76 % (ref 43–77)

## 2010-10-10 LAB — BASIC METABOLIC PANEL
BUN: 19 mg/dL (ref 6–23)
CO2: 29 mEq/L (ref 19–32)
Calcium: 8.9 mg/dL (ref 8.4–10.5)
Chloride: 106 mEq/L (ref 96–112)
Chloride: 98 mEq/L (ref 96–112)
Creatinine, Ser: 1.57 mg/dL — ABNORMAL HIGH (ref 0.4–1.2)
GFR calc non Af Amer: 32 mL/min — ABNORMAL LOW (ref >60)
Glucose, Bld: 191 mg/dL — ABNORMAL HIGH (ref 70–99)
Glucose, Bld: 281 mg/dL — ABNORMAL HIGH (ref 70–99)
Potassium: 3.1 mEq/L — ABNORMAL LOW (ref 3.5–5.1)
Sodium: 142 mEq/L (ref 135–145)

## 2010-10-10 LAB — POCT CARDIAC MARKERS
CKMB, poc: 1.5 ng/mL (ref 1.0–8.0)
Troponin i, poc: <0.05 ng/mL (ref 0.00–0.09)
Troponin i, poc: <0.05 ng/mL (ref 0.00–0.09)

## 2010-10-10 LAB — GLUCOSE, CAPILLARY
Glucose-Capillary: 106 mg/dL — ABNORMAL HIGH (ref 70–99)
Glucose-Capillary: 131 mg/dL — ABNORMAL HIGH (ref 70–99)
Glucose-Capillary: 137 mg/dL — ABNORMAL HIGH (ref 70–99)
Glucose-Capillary: 186 mg/dL — ABNORMAL HIGH (ref 70–99)

## 2010-10-10 LAB — CBC
HCT: 41.1 % (ref 36.0–46.0)
HCT: 41.5 % (ref 36.0–46.0)
Hemoglobin: 13.8 g/dL (ref 12.0–15.0)
Hemoglobin: 14 g/dL (ref 12.0–15.0)
MCHC: 34 g/dL (ref 30.0–36.0)
MCV: 89.9 fL (ref 78.0–100.0)
MCV: 90.4 fL (ref 78.0–100.0)
Platelets: 234 10*3/uL (ref 150–400)
RBC: 4.57 MIL/uL (ref 3.87–5.11)
WBC: 7.9 10*3/uL (ref 4.0–10.5)
WBC: 8.4 10*3/uL (ref 4.0–10.5)

## 2010-10-10 LAB — BRAIN NATRIURETIC PEPTIDE: Pro B Natriuretic peptide (BNP): 723 pg/mL — ABNORMAL HIGH (ref 0.0–100.0)

## 2010-10-29 LAB — CBC
HCT: 41.3 % (ref 36.0–46.0)
HCT: 42.3 % (ref 36.0–46.0)
MCHC: 34 g/dL (ref 30.0–36.0)
MCV: 88.4 fL (ref 78.0–100.0)
MCV: 87.8 fL (ref 78.0–100.0)
Platelets: 263 10*3/uL (ref 150–400)
RBC: 4.82 MIL/uL (ref 3.87–5.11)
RDW: 15.4 % (ref 11.5–15.5)
WBC: 9 10*3/uL (ref 4.0–10.5)

## 2010-10-29 LAB — DIFFERENTIAL
Basophils Absolute: 0.1 10*3/uL (ref 0.0–0.1)
Basophils Relative: 0 % (ref 0–1)
Lymphocytes Relative: 39 % (ref 12–46)
Lymphs Abs: 2.6 10*3/uL (ref 0.7–4.0)
Lymphs Abs: 1.1 10*3/uL (ref 0.7–4.0)
Monocytes Absolute: 0.8 10*3/uL (ref 0.1–1.0)
Monocytes Relative: 12 % (ref 3–12)
Monocytes Relative: 5 % (ref 3–12)
Neutro Abs: 3 10*3/uL (ref 1.7–7.7)
Neutro Abs: 7.3 10*3/uL (ref 1.7–7.7)
Neutrophils Relative %: 81 % — ABNORMAL HIGH (ref 43–77)

## 2010-10-29 LAB — GLUCOSE, CAPILLARY
Glucose-Capillary: 163 mg/dL — ABNORMAL HIGH (ref 70–99)
Glucose-Capillary: 124 mg/dL — ABNORMAL HIGH (ref 70–99)
Glucose-Capillary: 90 mg/dL (ref 70–99)
Glucose-Capillary: 97 mg/dL (ref 70–99)

## 2010-10-29 LAB — COMPREHENSIVE METABOLIC PANEL
AST: 22 U/L (ref 0–37)
Albumin: 3.8 g/dL (ref 3.5–5.2)
BUN: 8 mg/dL (ref 6–23)
Calcium: 9.1 mg/dL (ref 8.4–10.5)
Creatinine, Ser: 0.99 mg/dL (ref 0.4–1.2)
GFR calc Af Amer: >60 mL/min (ref >60)
Total Bilirubin: 0.5 mg/dL (ref 0.3–1.2)
Total Protein: 7.1 g/dL (ref 6.0–8.3)

## 2010-10-29 LAB — URINALYSIS, ROUTINE W REFLEX MICROSCOPIC
Bilirubin Urine: NEGATIVE
Glucose, UA: NEGATIVE mg/dL
Ketones, ur: NEGATIVE mg/dL
Leukocytes, UA: NEGATIVE
Nitrite: NEGATIVE
Protein, ur: 100 mg/dL — AB
Protein, ur: 100 mg/dL — AB
Urobilinogen, UA: 1 mg/dL (ref 0.0–1.0)
pH: 6 (ref 5.0–8.0)

## 2010-10-29 LAB — BRAIN NATRIURETIC PEPTIDE: Pro B Natriuretic peptide (BNP): 239 pg/mL — ABNORMAL HIGH (ref 0.0–100.0)

## 2010-10-29 LAB — TROPONIN I: Troponin I: 0.02 ng/mL (ref 0.00–0.06)

## 2010-10-29 LAB — POCT CARDIAC MARKERS
Myoglobin, poc: 109 ng/mL (ref 12–200)
Troponin i, poc: <0.05 ng/mL (ref 0.00–0.09)

## 2010-10-29 LAB — BASIC METABOLIC PANEL
BUN: 13 mg/dL (ref 6–23)
CO2: 30 mEq/L (ref 19–32)
Chloride: 101 mEq/L (ref 96–112)
Chloride: 105 mEq/L (ref 96–112)
GFR calc Af Amer: 50 mL/min — ABNORMAL LOW (ref >60)
Glucose, Bld: 112 mg/dL — ABNORMAL HIGH (ref 70–99)
Potassium: 3.6 mEq/L (ref 3.5–5.1)
Potassium: 4.1 mEq/L (ref 3.5–5.1)
Sodium: 141 mEq/L (ref 135–145)

## 2010-10-29 LAB — CK TOTAL AND CKMB (NOT AT ARMC)
Relative Index: 2 (ref 0.0–2.5)
Relative Index: 2 (ref 0.0–2.5)
Total CK: 171 U/L (ref 7–177)

## 2010-10-29 LAB — DIGOXIN LEVEL: Digoxin Level: 0.9 ng/mL (ref 0.8–2.0)

## 2010-10-29 LAB — URINE MICROSCOPIC-ADD ON

## 2010-10-30 LAB — CBC
HCT: 39.9 % (ref 36.0–46.0)
MCHC: 33.9 g/dL (ref 30.0–36.0)
MCHC: 33.9 g/dL (ref 30.0–36.0)
Platelets: 231 10*3/uL (ref 150–400)
Platelets: 242 10*3/uL (ref 150–400)
Platelets: 246 10*3/uL (ref 150–400)
RBC: 4.65 MIL/uL (ref 3.87–5.11)
RBC: 5.18 MIL/uL — ABNORMAL HIGH (ref 3.87–5.11)
RDW: 16.2 % — ABNORMAL HIGH (ref 11.5–15.5)
WBC: 11.6 10*3/uL — ABNORMAL HIGH (ref 4.0–10.5)
WBC: 9 10*3/uL (ref 4.0–10.5)

## 2010-10-30 LAB — GLUCOSE, CAPILLARY
Glucose-Capillary: 121 mg/dL — ABNORMAL HIGH (ref 70–99)
Glucose-Capillary: 125 mg/dL — ABNORMAL HIGH (ref 70–99)
Glucose-Capillary: 144 mg/dL — ABNORMAL HIGH (ref 70–99)
Glucose-Capillary: 149 mg/dL — ABNORMAL HIGH (ref 70–99)
Glucose-Capillary: 154 mg/dL — ABNORMAL HIGH (ref 70–99)
Glucose-Capillary: 157 mg/dL — ABNORMAL HIGH (ref 70–99)

## 2010-10-30 LAB — HEMOGLOBIN A1C
Hgb A1c MFr Bld: 6.7 % — ABNORMAL HIGH (ref 4.6–6.1)
Mean Plasma Glucose: 146 mg/dL

## 2010-10-30 LAB — HEPARIN LEVEL (UNFRACTIONATED)
Heparin Unfractionated: 0.5 IU/mL (ref 0.30–0.70)
Heparin Unfractionated: 0.6 IU/mL (ref 0.30–0.70)

## 2010-10-30 LAB — BASIC METABOLIC PANEL
BUN: 12 mg/dL (ref 6–23)
Calcium: 8.5 mg/dL (ref 8.4–10.5)
Calcium: 9.7 mg/dL (ref 8.4–10.5)
Creatinine, Ser: 1.07 mg/dL (ref 0.4–1.2)
GFR calc Af Amer: 33 mL/min — ABNORMAL LOW (ref >60)
GFR calc Af Amer: >60 mL/min (ref >60)
GFR calc non Af Amer: 27 mL/min — ABNORMAL LOW (ref >60)
Potassium: 2.9 mEq/L — ABNORMAL LOW (ref 3.5–5.1)
Sodium: 139 mEq/L (ref 135–145)

## 2010-10-30 LAB — CK TOTAL AND CKMB (NOT AT ARMC)
CK, MB: 3.5 ng/mL (ref 0.3–4.0)
CK, MB: 4.2 ng/mL — ABNORMAL HIGH (ref 0.3–4.0)
Total CK: 132 U/L (ref 7–177)
Total CK: 149 U/L (ref 7–177)

## 2010-10-30 LAB — COMPREHENSIVE METABOLIC PANEL
AST: 27 U/L (ref 0–37)
Albumin: 3 g/dL — ABNORMAL LOW (ref 3.5–5.2)
Albumin: 3.4 g/dL — ABNORMAL LOW (ref 3.5–5.2)
Alkaline Phosphatase: 80 U/L (ref 39–117)
BUN: 16 mg/dL (ref 6–23)
Calcium: 8.6 mg/dL (ref 8.4–10.5)
Chloride: 104 mEq/L (ref 96–112)
Creatinine, Ser: 1.23 mg/dL — ABNORMAL HIGH (ref 0.4–1.2)
GFR calc Af Amer: 52 mL/min — ABNORMAL LOW (ref >60)
Glucose, Bld: 122 mg/dL — ABNORMAL HIGH (ref 70–99)
Potassium: 3.8 mEq/L (ref 3.5–5.1)
Sodium: 143 mEq/L (ref 135–145)
Total Bilirubin: 1.2 mg/dL (ref 0.3–1.2)
Total Protein: 6.3 g/dL (ref 6.0–8.3)
Total Protein: 6.7 g/dL (ref 6.0–8.3)

## 2010-10-30 LAB — DIFFERENTIAL
Basophils Absolute: 0 10*3/uL (ref 0.0–0.1)
Basophils Relative: 0 % (ref 0–1)
Eosinophils Absolute: 0 10*3/uL (ref 0.0–0.7)
Monocytes Absolute: 1 10*3/uL (ref 0.1–1.0)
Neutro Abs: 9.5 10*3/uL — ABNORMAL HIGH (ref 1.7–7.7)
Neutrophils Relative %: 82 % — ABNORMAL HIGH (ref 43–77)

## 2010-10-30 LAB — URINE MICROSCOPIC-ADD ON

## 2010-10-30 LAB — APTT: aPTT: 30 seconds (ref 24–37)

## 2010-10-30 LAB — URINALYSIS, ROUTINE W REFLEX MICROSCOPIC
Bilirubin Urine: NEGATIVE
Glucose, UA: NEGATIVE mg/dL
Specific Gravity, Urine: 1.014 (ref 1.005–1.030)
Urobilinogen, UA: 0.2 mg/dL (ref 0.0–1.0)

## 2010-10-30 LAB — TROPONIN I: Troponin I: 0.02 ng/mL (ref 0.00–0.06)

## 2010-10-30 LAB — LIPID PANEL
LDL Cholesterol: 97 mg/dL (ref 0–99)
Triglycerides: 109 mg/dL (ref <150)

## 2010-10-30 LAB — BRAIN NATRIURETIC PEPTIDE
Pro B Natriuretic peptide (BNP): 395 pg/mL — ABNORMAL HIGH (ref 0.0–100.0)
Pro B Natriuretic peptide (BNP): 89 pg/mL (ref 0.0–100.0)

## 2010-10-30 LAB — MAGNESIUM: Magnesium: 1.9 mg/dL (ref 1.5–2.5)

## 2010-11-15 IMAGING — CR DG CHEST 1V PORT
1 series · 1 of 1 positions shown · non-contrast
Comparison: Unenhanced CT chest 06/03/2008 and 05/22/2007.
Portable chest x-ray and CT angio chest 11/08/2006.

CLINICAL DATA: Chest pain.

PORTABLE CHEST - 1 VIEW [DATE]/1474 1238 hours:

[view not recorded]
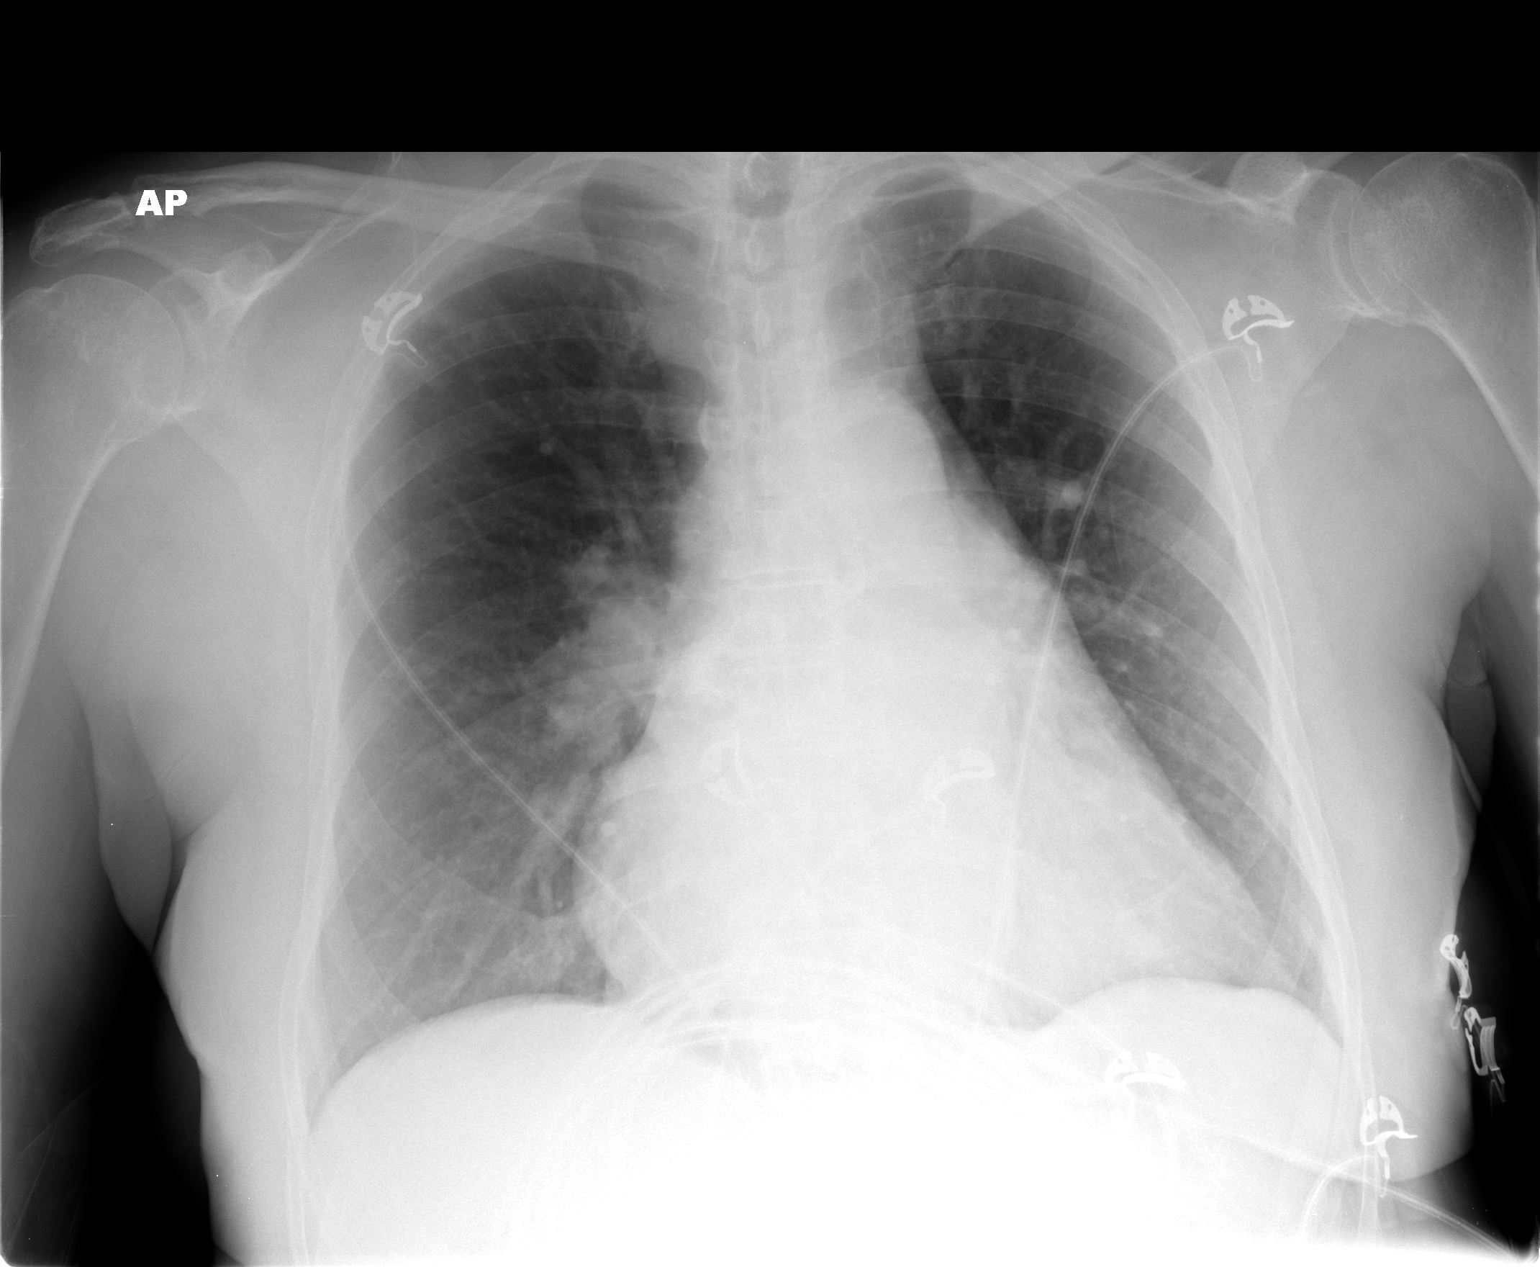

[1 of 1 positions shown; findings below may reference images not displayed]

FINDINGS: Heart markedly enlarged but stable.  Markedly enlarged
central pulmonary arteries, unchanged.  Mild pulmonary venous
hypertension without overt pulmonary edema; less vascular
congestion than on the prior portable chest x-ray.  No pleural
effusions.  No localized airspace consolidation.
IMPRESSION: Stable cardiomegaly and enlarged central pulmonary arteries.  No
acute cardiopulmonary disease.

## 2010-12-05 NOTE — Assessment & Plan Note (Signed)
OFFICE VISIT   ARSENIA, GORACKE  DOB:  [birth year]                                        May 22, 2007  CHART #:  [id number]   Patient returns to the office today after she has been followed since  2002 for an approximately 4.5 cm dilatation of her ascending aorta.  She  was last seen in October, 2007, and returns today with a repeat  echocardiogram.  Since she was seen last year she was admitted to the  hospital with congestive heart failure and ultimately underwent cardiac  catheterization and a repeat CT scan, this was done in April of 2008,  that showed no significant coronary disease and the ascending aorta was  unchanged.  She continues to have chronic problems with blood pressure  control.  She denies any angina.  A CT scan is performed today.   EXAM:  Today, her blood pressure was markedly elevated, initially taken  it was 207/121, pulse was 85, respiratory rate was 18, O2 sat was 97%.  She had significant periorbital edema which she notes has been a chronic  problem since her cataracts were operated on.  I do not appreciate any  murmur of aortic insufficiency.  Lungs:  Clear bilaterally.  She has  palpable brachial, radial and pedal pulses bilaterally.   She notes that she did not take her blood pressure medicine today.  We  let her rest in the office for a short time and repeated pressure and it  decreased some.  However, because of chronic problems with her blood  pressure and the level that it is today, contacted her cardiologist, Dr.  Caprice Kluver, whose offices will see her today.  She took a paper with the  blood pressures we obtained listed and went to Dr. Fredirick Maudlin office, as  soon as she left here, for further adjustment and control of blood  pressure which, obviously with her abnormal aorta, is important.   The CT scan is reviewed and appears unchanged from the one a year ago  and the one done in April of 2008.  I plan to see her back  again 1 year  with a repeat CT scan, or sooner at Dr. Fredirick Maudlin request.   Sheliah Plane, MD  Electronically Signed   EG/MEDQ  D:  05/22/2007  T:  05/23/2007  Job:  [id number]   cc:   Thereasa Solo. Little, M.D.  Talmadge Coventry, M.D.

## 2010-12-05 NOTE — Discharge Summary (Signed)
NAMECHERICE, GLENNIE              ACCOUNT NO.:  [id number]   MEDICAL RECORD NO.:  [id number]          PATIENT TYPE:  INP   LOCATION:  3714                         FACILITY:  [facility]   PHYSICIAN:  Thereasa Solo. Little, M.D. DATE OF BIRTH:  March 03, 1931   DATE OF ADMISSION:  12/26/2008  DATE OF DISCHARGE:  12/28/2008                               DISCHARGE SUMMARY   DISCHARGE DIAGNOSES:  1. Chest pain, troponins negative this admission.  2. Chronic atrial fibrillation, not felt to be a Coumadin candidate      because of compliance issues.  3. Known ascending aortic aneurysm, stable by CT this admission at 4.8      cm.  4. Questionable compliance with medications, the patient has several      different lists of medications and is not sure exactly what she      takes and at discharge did not have her medicines available.  5. Hypertension.  6. Atrial fibrillation, increased ventricular response at admission,      improved at discharge.  7. Normal coronaries in 2008.  8. Nonischemic cardiomyopathy with an ejection fraction of 30-35%,      stable.  9. Dyslipidemia.  10.Type 2 non-insulin-dependent diabetes.   HOSPITAL COURSE:  Ms. Kepple is a pleasant 77 year old female with  chronic atrial fibrillation and nonischemic cardiomyopathy and diabetes.  She presented to the emergency room with chest pain and described as  pressure.  She is somewhat of a poor historian.  She was admitted to  telemetry.  She was in atrial fibrillation with increased ventricular  response.  Her amlodipine was discontinued.  She was put on diltiazem.  Her troponins were negative.  CT scan of her chest for her known  ascending aneurysm showed this to be at 4.8 cm.  She was put on a short  course of antibiotic for possible bronchitis.  Dr. Fredirick Maudlin notes  suggest she is probably not a Coumadin candidate because of compliance  issues, Dr. Alanda Amass did note that her aneurysm is not and absolute  contraindication to  Coumadin.  Echocardiogram done shows moderate LVH  with normal LV function.  EF is 50-55% at this time.  She had mild  pulmonary hypertension with a peak pressure of 36 mm.  Her heart rate  was easily controlled with diltiazem.  We feel she can be discharged on  December 28, 2008.  Unfortunately, I believe her multiple medications are one  of her main issues.  She had a list at that was given to the nurses here  at the hospital that did not match our outpatient list from her last  office visit.  When I went to discharge the patient, her room medicines  were not available to me.  We sent her home on the above medications,  but she will need to bring all her home medicines to the office when she  sees Dr. Clarene Duke.   DISCHARGE MEDICATIONS:  1. Potassium 20 mEq a day.  2. Coreg 12.5 mg twice a day.  3. Aspirin 81 mg 2 tablets a day.  4. Fish oil once a day.  5. Colace p.r.n.  6. Magnesium oxide 400 mg twice a day.  7. Hydrocodone q.6 h. p.r.n.  8. Plavix 75 mg a day.  9. Allopurinol 100 mg a day.  10.Levothyroxine 0.05 mg a day.  11.Simvastatin 40 mg a day.  12.Lanoxin 0.125 mg a day.  13.Glipizide 10 mg a day.  14.Lorazepam 0.5 mg three times a day as needed.  15.Protonix 40 mg a day.  16.Diltiazem 180 mg a day.   LABORATORY DATA:  White count 7.7, hemoglobin 13.5, hematocrit 39.9, and  platelets 231.  Sodium 143, potassium 3.8, BUN 16, creatinine 1.3.  Liver functions were normal.  CK-MBs and troponins are negative.  Lipid  panel shows cholesterol 160, triglycerides 109, HDL 41, LDL 97.  TSH  2.09.  Chest x-ray shows stable cardiomegaly.   DISPOSITION:  The patient is discharged in stable condition.  She will  follow up with Dr. Clarene Duke in the office.  She has been instructed to  bring all her home medications with her.      Abelino Derrick, P.A.    ______________________________  Thereasa Solo. Little, M.D.    Lenard Lance  D:  12/28/2008  T:  12/29/2008  Job:  [id number]   cc:    Bennett Scrape, MD

## 2010-12-05 NOTE — H&P (Signed)
[name], PIES NO.:  [id number]   MEDICAL RECORD NO.:  [id number]          PATIENT TYPE:  EMS   LOCATION:  [facility]JO                         FACILITY:  [facility]   PHYSICIAN:  Ruthy Dick, MD    DATE OF BIRTH:  19-Apr-1931   DATE OF ADMISSION:  01/21/2009  DATE OF DISCHARGE:                              HISTORY & PHYSICAL   The patient was seen and examined in the emergency room.   CHIEF COMPLAINT:  Presyncope, generalized weakness.   HISTORY OF PRESENT ILLNESS:  [name] is a 77 year old pleasant  African American lady with a past medical history significant for  diabetes mellitus type 2, hypothyroidism, hypertension, nonischemic  cardiomyopathy, chronic atrial fibrillation and dyslipidemia who came  into the emergency room because of generalized weakness and presyncopal  like episode that happened since yesterday.  She said she was in her  usual state of relatively good health when as of yesterday she started  feeling very weak and tired and a little bit on the dizzy side and this  continued until today.  In fact at the time that I saw her she was still  complaining of some generalized weakness and dizziness.  She said she  did not really black out but she had the feeling that she could black  out.  No chest pain.  She was recently discharged from the hospital on  December 28, 2008, for chest pain.  She sees Dr. Gaspar Garbe B. Little as her  cardiologist.  During the admission she was being managed by Dr. Clarene Duke  and his team.  Today the patient denies shortness of breath, denies  nausea, vomiting, denies abdominal pain, denies melenic stool and  hematochezia.  Denies dysuria, frequency or urgency.  Denies syncope.  In the emergency room the patient was noted to have heart rates in the  40s and low 50s and the suspicion at this time is that this is probably  symptomatic bradycardia.   PAST MEDICAL HISTORY:  1. Atrial fibrillation, congestive heart failure with the  last      ejection fraction done on December 27, 2008, being 50-55%.  Prior to      that the patient had had an ejection fraction of 32-35% in 2008.  2. Diabetes mellitus type 2.  3. Dyslipidemia.  4. Hypertension.  5. History of nonischemic cardiomyopathy with an ejection fraction of      30-35% but then again a repeat 2-D echo last month showed a 50-55%      ejection fraction.  6. History of medication noncompliance.  7. History of known ascending aortic aneurysm, stable during last      admission at 4.8 cm.   SOCIAL HISTORY:  The patient says she lives alone and takes care of her  activities of daily living.  Does not smoke, does not drink, does not do  illicit drugs.   MEDICATIONS:  1. Potassium 20 mEq daily.  2. Coreg 12.5 mg b.i.d.  3. Aspirin 81 mg daily.  4. Fish oil 1 tablet daily.  5. Colace p.r.n.  6. Magnesium oxide b.i.d. 400 mg.  7. Hydrocodone q.6 h p.r.n. 1 tablet.  8. Plavix 75 mg daily.  9. Allopurinol 100 mg daily.  10.Levothyroxine 50 mcg daily.  11.Simvastatin 40 mg daily.  12.Lanoxin 0.125 mg daily.  13.Glipizide 10 mg daily.  14.Lorazepam 0.5 mg t.i.d. p.r.n.  15.Protonix 40 mg daily.  16.Cardizem 180 mg daily.  17.Metformin 500 mg daily.   ALLERGIES:  Fish oil (sic) and iodine.   FAMILY HISTORY:  Significant for mother that had stroke and family has  very strong history for diabetes and hypertension.   REVIEW OF SYSTEMS:  A 12 point review of system was done and was  negative except as in history of presenting illness.   PHYSICAL EXAMINATION:  GENERAL:  Seen and examined in the emergency  room.  In no acute cardiopulmonary distress, alert and oriented x3.  VITAL SIGNS:  Temperature 97.7, pulse between 45 and 59.  Blood pressure  150/78 and saturating 98% on room air.  HEENT:  Normocephalic, atraumatic.  Pupils equal, round, react to light.  Extraocular muscles intact.  Nares patent.  NECK:  Supple.  No JVD.  No lymphadenopathy.  No thyromegaly.   CHEST:  Clear to auscultation bilaterally.  No rhonchi.  No rales.  No  wheezing.  ABDOMEN:  Soft, nontender.  No hepatosplenomegaly.  EXTREMITIES:  No clubbing.  No cyanosis.  No edema.  CARDIOVASCULAR:  Irregularly irregular.  No murmurs.  CENTRAL NERVOUS SYSTEM:  Nonfocal.  Cranial nerves intact II-XII.  The  patient has normal reflexes.  Power is 5/5 globally.   LABS AND INVESTIGATIONS:  CBC with differential is fairly normal.  Glucose 197, BUN 18, creatinine 1.27, B type natriuretic peptide 239.  Urinalysis no evidence for urinary tract infection.  Chest x-ray none  ordered today.  Chest CT done on December 26, 2008, did not show any evidence  of cardiopulmonary abnormalities.  EKG shows atrial fibrillation with no  acute ST-T wave abnormality.  No evidence of digoxin toxicity.  Digoxin  level 0.9.   ASSESSMENT:  1. Symptomatic bradycardia.  2. Generalized weakness and presyncope likely secondary to      bradycardia.  3. Chronic atrial fibrillation, rate controlled at this time.  The      patient is not a candidate for Coumadin due to issues of      noncompliance.  4. Ascending aortic aneurysm with the last CT scan being stable at 4.8      cm.  5. Medication noncompliance.  6. Hypertension.  7. Type 2 non-insulin-dependent diabetes.  8. Dyslipidemia.   PLAN OF CARE:  The patient will be admitted to telemetry.  Enzymes will  be checked serially.  We would stop all medications that could lead to  bradycardia like Coreg, Cardizem and digoxin.  We have discussed with  the  cardiology group, [facility].  They will manage  this patient along with [place].  In the meantime we will continue her other  outpatient medications.  We strongly believe that her presyncope was due  to her bradycardia but we will await the cardiology input.      Ruthy Dick, MD  Electronically Signed     GU/MEDQ  D:  01/21/2009  T:  01/21/2009  Job:  [id number]

## 2010-12-05 NOTE — Assessment & Plan Note (Signed)
OFFICE VISIT   Nicole [name], PROPPS  DOB:  13-Feb-1931                                        June 03, 2008  CHART #:  [id number]   The patient returns to the office today with a followup CT scan of  chest.  I originally saw in 2002 for a dilated ascending aorta.  Since  that time, she has had serial CT scan of the chest with the range of the  ascending aorta still under 5 cm.  Scan done today is basically  unchanged from when previously had between 4.7-4.9 cm.  So, over a 7-  year period, she has had approximately 2-3 mm a change in the ascending  aorta.  She did have an admission to the hospital in 2007 for congestive  heart failure and underwent cardiac catheterization and CT scan in April  2008, showed no significant coronary artery disease and the ascending  aorta was unchanged.  She returns today for a followup scan.  She has  had no anginal symptoms, some mild shortness of breath with exertion,  and continues to have difficulty controlling her blood pressure.   PHYSICAL EXAMINATION:  VITAL SIGNS:  Today, her blood pressure is  165/103.  She notes that she had not taken her medicine this morning  because of having the scan, in addition had been will had been  instructed by Dr. Clarene Duke to increase her Coreg to 25 twice a day, but  was still taking the 12.5 twice a day until she got the new prescription  filled.  LUNGS:  Clear bilaterally.  CARDIAC:  Regular rate and rhythm without murmur or gallop.  ABDOMEN:  Benign.  I do not palpate any abdominal aneurysm enlargement.  EXTREMITIES:  She has full DP and PT pulses, radial pulses, and brachial  pulses bilaterally.  She has no pedal edema.   MEDICATIONS:  She continues on a long list of medicines including Os-  Cal, Coreg currently 12.5 b.i.d., but has been instructed to increase it  to 25 b.i.d., Cozaar 100 a day, metformin 500 b.i.d., aspirin, Plavix 75  a day, Norvasc 10 a day, amlodipine half  tablet a day, Lipitor 40 mg a  day, Zetia 10 mg a day, allopurinol 100 mg a day, Levothroid 50 mcg a  day, Lanoxin 0.25 a day, Lasix 20 mg a day, glyburide 10 mg a day,  potassium 10 mEq a day, Benadryl, Correctol, fish oil, and Aleve.   REVIEW OF SYSTEMS:  Reviewed with her.   I reviewed the CT scan with her.  She does have prominence of a  pulmonary artery suggesting cone and degree of pulmonary hypertension.  The ascending aorta is basically unchanged and less than 5 cm.  She is  aware of some increased risk of aortic dissection even at the size she  has, but it is less than 41 cm and 77 years old and a very little or no  growth rate.  We would recommend continue medical therapy.  I have  stressed whether importance of good blood pressure control and to take  her medicines as instructed.  I will plan to see her back again in 18  months with a followup CT scan.   Sheliah Plane, MD  Electronically Signed   EG/MEDQ  D:  06/03/2008  T:  06/03/2008  Job:  [id number]   cc:   Samara Snide, MD  Anna Genre Little, M.D.

## 2010-12-05 NOTE — Discharge Summary (Signed)
NAMEMAGUIRE, KILLMER              ACCOUNT NO.:  [id number]   MEDICAL RECORD NO.:  [id number]          PATIENT TYPE:  INP   LOCATION:  3712                         FACILITY:  [facility]   PHYSICIAN:  Ruthy Dick, MD    DATE OF BIRTH:  03-Jun-1931   DATE OF ADMISSION:  01/21/2009  DATE OF DISCHARGE:  01/23/2009                               DISCHARGE SUMMARY   REASON FOR ADMISSION:  Dizziness, syncope, symptomatic bradycardia.   FINAL DISCHARGE DIAGNOSES:  1. Symptomatic bradycardia.  2. Presyncope and weakness likely secondary to symptomatic      bradycardia.  3. Chronic atrial fibrillation.  4. Renal insufficiency, resolved.  5. Diabetes mellitus, type 2.  6. Hypertension.  7. Dyslipidemia.  8. Ascending aortic aneurysm, stable at 4.8 cm during last admission.  9. History of noncompliance.  10.Hypothyroidism.   CONSULT DURING THIS ADMISSION:  Cardiology consult.   PROCEDURES DONE DURING THIS ADMISSION:  None.   BRIEF HISTORY OF PRESENT ILLNESS:  This is a pleasant lady with a  diabetes mellitus type 2, hypothyroidism, hypertension, and chronic  atrial fibrillation who came to the emergency room with a generalized  weakness and presyncopal like episode.  She was noted to have low heart  rate and was noted to be on digoxin, Cardizem, and steroid.  In the  hospital, she continued to be in atrial fibrillation and her heart rate  went down to 36 per minute, but her symptoms had resolved at this time.  Last night, she also had a low heart rates around 36 and 57, but again  she had no symptoms whatsoever.  Cardiology team I believe her  medications should be adjusted.  Cardizem was discontinued and Norvasc  was started.  Coreg was also reduced to 6.25 b.i.d. and digoxin  continued at 0.125 daily.  According to Cardiology, the patient is  stable enough to be discharged home and she does not have any complaints  today whatsoever.  No chest pain, no shortness of breath, no abdominal  pain, no nausea, no vomiting, no dizziness, no presyncope, no syncope,  no diarrhea, no dysuria, no rash.  Now, we discussed medication  compliance with the patient and she voices understanding.  The patient  is to follow with her primary care physician, Dr. Marijo Conception in about a  week and she is to call for this appointment, also spoken to the  cardiologist [facility] and the plan is  to make an appointment for this patient in about 1-2 weeks to see Dr.  Julieanne Manson.  Time used for discharge planning is greater than 30  minutes.   The patient is to go back home now on the following medications;  1. Coreg 6.25 mg p.o. b.i.d.  2. Metformin 1000 mg p.o. b.i.d.  3. Norvasc 10 mg p.o. daily.  4. Magnesium oxide 400 mg b.i.d.  5. Hydrocodone APAP 500 mg one tablet q.6 h. as needed for pain.  6. Plavix 75 mg daily.  7. Fish oil 1 g p.o. daily.  8. Allopurinol 100 mg p.o. daily.  9. Synthroid 50 mcg  p.o. daily.  10.Zocor 40 mg daily.  11.Digoxin 0.125 mg p.o. daily.  12.Glipizide 10 mg p.o. daily.  13.Lorazepam 0.5 mg p.o. b.i.d. as needed.  14.Protonix 40 mg p.o. daily.   As already noted, the patient is to stop taking Cardizem.  The patient  is also to be on potassium chloride 20 mEq p.o. daily, aspirin 81 mg  p.o. daily, Colace 100 mg p.o. as needed for constipation, Os-Cal plus D  one tablet daily.   DISCHARGE PHYSICAL EXAMINATION:  VITAL SIGNS:  Temperature 97.5, pulse  68, respiration 19, blood pressure 160/94, and saturating 97% on room  air.  CHEST:  Clear to auscultation bilaterally.  No rhonchi, no rales, no  wheezing.  ABDOMEN:  Soft, nontender.  EXTREMITIES:  No clubbing, no signs, no edema.  CARDIOVASCULAR:  Irregularly regular.  CENTRAL NERVOUS SYSTEM:  Nonfocal.      Ruthy Dick, MD  Electronically Signed     GU/MEDQ  D:  01/23/2009  T:  01/24/2009  Job:  [id number]   cc:   Bennett Scrape, MD  Thereasa Solo. Little, M.D.

## 2010-12-08 NOTE — H&P (Signed)
. [facility]  Patient:    Nicole [name], Nicole [name] Visit Number: [id number] MRN: [id number]          Service Type: MED Location: [phone] Attending Physician:  Loreli Dollar Dictated by:   Abelino Derrick, P.A.C. Admit Date:  06/21/2001                           History and Physical  CHIEF COMPLAINT: Increased heart rate and shortness of breath.  HISTORY OF PRESENT ILLNESS: Nicole [name] is a delightful 77 year old female, followed Dr. Smith Mince and seen by Dr. Clarene Duke in the past for palpitations. The patient says work-up included what sounds like an echocardiogram and a stress test.  She has not had cardiac catheterization or MI in the past.  She says that nothing in particular was ever found.  She has been on Atenolol. She has usually short-lived episodes of palpitations which are relieved within a few minutes with rest.  Today about 4 p.m. she developed increased heart rate and shortness of breath.  This episode did not resolve with her usual treatment at home and she presented to the emergency room.  She in atrial fib with ventricular response of about 160.  She was treated with Cardizem, Lovenox, aspirin, and potassium in the emergency room and her rate is in the 80s now and she is much more comfortable.  She denies any chest pain.  PAST MEDICAL HISTORY: Dukes 2C colon cancer.  She had surgery in 1999, which was followed by chemotherapy.  She had a checkup three months ago with her oncologist and was told she was doing well.  She says she has no evidence of diabetes, hypertension.  PAST SURGICAL HISTORY: Has had partial hysterectomy in 1978.  CURRENT MEDICATIONS (She is unclear of the doses):  1. Lotrel.  2. Zoloft.  3. Atenolol.  4. Hydrochlorothiazide.  5. Claritin.  ALLERGIES: Reaction to some type of IV DYE, which was nausea and vomiting.  SOCIAL HISTORY: She is a widow.  Nonsmoker.  Has four children and  five grandchildren.  FAMILY HISTORY: Positive for coronary disease.  Her father had an MI in his 34s.  REVIEW OF SYSTEMS: Essentially unremarkable except as noted above.  There is no history of fever or chills.  There is no history of thyroid disease.  There is no history of peptic ulcer disease or GI bleeding.  She has not had kidney disease.  She does have a history of an abdominal aortic aneurysm that is followed by Dr. Tyrone Sage, this last checked about a year ago.  PHYSICAL EXAMINATION:  VITAL SIGNS: BP 170/88, pulse 88, respirations 18.  GENERAL: She is a well-developed, well-nourished female in no acute distress.  HEENT: Normocephalic.  EOMI.  Sclerae nonicteric.  Lids and conjunctivae within normal limits.  NECK: Without JVD, without bruits.  CHEST: Diminished breath sounds overall but no rales.  CARDIAC: Irregularly irregular rhythm with 1-2/6 systolic murmur at the left sternal border and apex.  ABDOMEN: Nontender.  She has a midline surgical scar.  There is no hepatosplenomegaly.  Bowel sounds present.  EXTREMITIES: No edema.  No femoral artery bruits.  Dorsalis pedis and posterior tibial pulses are 3+/4 bilaterally.  NEUROLOGIC: Grossly intact.  She is awake and alert and oriented.  LABORATORY DATA: Sodium 146, potassium 3.2, BUN 18, creatinine 0.9. Hemoglobin 14.  EKG reveals AF with incomplete right bundle branch block.  Chest x-ray reveals cardiomegaly and mild  chronic vascular congestion, and some chronic interstitial changes which are also described as mild.  IMPRESSION:  1. Atrial fibrillation with rapid ventricular response.  2. Hypertension.  3. History of colon cancer, with surgery and chemotherapy in 1999.  4. History of abdominal aortic aneurysm, followed by Dr. Tyrone Sage of [facility].  5. Non-insulin dependent diabetes.  PLAN: She will be admitted to telemetry.  She will be continued on Cardizem and Lovenox and aspirin for now. Dictated by:   Abelino Derrick, P.A.C. Attending Physician:  Loreli Dollar DD:  06/21/01 TD:  06/22/01 Job: [id number]

## 2010-12-08 NOTE — H&P (Signed)
NAMECACIE, GASKINS              ACCOUNT NO.:  [id number]   MEDICAL RECORD NO.:  [id number]          PATIENT TYPE:  INP   LOCATION:  2919                         FACILITY:  [facility]   PHYSICIAN:  Thereasa Solo. Little, M.D. DATE OF BIRTH:  26-Jul-1930   DATE OF ADMISSION:  04/24/2006  DATE OF DISCHARGE:                                HISTORY & PHYSICAL   CHIEF COMPLAINT:  Shortness of breath and tachycardia.   HISTORY OF PRESENT ILLNESS:  The patient is a 77 year old female who has  been followed by Dr. Clarene Duke and Talmadge Coventry, M.D.  She has a history  of atrial fibrillation, diabetes, and hypertension.  She has no history of  coronary disease or MI.  She had a negative Cardiolite and an echocardiogram  in 2005.  She has apparently been in persistent atrial fib alternating with  atrial flutter.  She is not felt to be a Coumadin candidate because of a  history of an ascending aortic aneurysm that is followed by Sheliah Plane,  M.D.  She was last seen by Dr. Clarene Duke in December 2006 and her medications  were adjusted for hypertension.  She is admitted now with complaints of  increasing shortness of breath and tachycardia.  In the emergency room her  heart rate initially was 130 and she was in congestive failure.  She is  improved now after Lasix, potassium repletion, and IV Cardizem.   PAST MEDICAL HISTORY:  Is remarkable for PAF, she first presented in 2002  with documented AF but has a long history of palpitations.  She has an  ascending aortic aneurysm and had a CT scan of her chest in July 2007 that  showed no change from 2005 with her aneurysm being 4.7 cm.  She has  hypertension that has been somewhat difficult to control and non-insulin-  dependent diabetes.  She has seen Veverly Fells. Altheimer, M.D. in the past.  She has a history of colon cancer and had surgery and chemotherapy in 1999.  She has mild renal insufficiency.  She has treated dyslipidemia.  She had an  incidental  finding of cholelithiasis on her CT scan in July 2007.   MEDICATIONS:  Her actual medications are unclear.  She has three separate  lists with her and none of them completely correlate.  None of the three  lists she has correlates with our office records.  The patient is unable to  give me a clear history of what medicine she is actually taking.   ALLERGIES:  She is intolerant to IV CONTRAST which causes nausea.  She has  had BETA BLOCKER intolerance in the past with fatigue and bradycardia.  Dr.  Sherene Sires stopped her ACE INHIBITOR in the past because of cough.   SOCIAL HISTORY:  She is a widow.  She has 4 children and 5 grandchildren,  one of her grandsons is a Comptroller at [employer].  She is a nonsmoker.   FAMILY HISTORY:  Is remarkable for coronary disease, father had MI in his  2s.   REVIEW OF SYSTEMS:  She does have a history of gout.  She had a fall in July  2007 and presented to emergency room and had a CT of her chest, abdomen and  pelvis.  Chest CT showed no evidence of a pulmonary embolism and her  ascending aortic aneurysm to be unchanged from October 2005.  CT of her  abdomen showed incidental finding of hepatic hemangioma and cholelithiasis.  CT of her pelvis was negative.   PHYSICAL EXAM:  VITAL SIGNS:  Blood pressure 148/86, pulse now is 90,  temperature 96.9.  GENERAL:  She is a well-developed, well-nourished African American female in  no acute distress.  HEENT: Normocephalic, extraocular movements intact, sclerae anicteric.  NECK:  Reveals JVD and no bruit.  Chest is now clear to auscultation and  percussion.  CARDIAC:  Reveals irregularly irregular rhythm with 2/6 systolic murmur at  left sternal border.  ABDOMEN:  Nontender.  She has midline surgical scar.  Extremities: With trace edema at the ankles.  NEUROLOGY:  Grossly intact, she is somewhat of a difficult historian and  gives vague answers. She is oriented.   LABS:  White count 6.3, hemoglobin 15.1,  hematocrit 45.5, platelets 273.  Sodium 143, potassium 2.9, BUN 14, creatinine 1.4.  Urinalysis does show  proteinuria and troponin is negative x1. D-dimer is 0.78.  BNP 478, INR 1.3.  EKG shows atrial fib with one tracing and atrial flutter in another. Chest x-  ray results pending.   IMPRESSION:  1. Atrial fibrillation with increased ventricular response.  2. Congestive heart failure.  3. Treated hypertension.  4. Non-insulin-dependent diabetes.  5. Ascending aortic aneurysm, recent CT scan showing no change since July      2007.  6. Mild renal insufficiency.  7. Past history of beta blocker and ACE intolerance.  8. History of gout.  9. Question of medical compliance.   PLAN:  The patient will be admitted to telemetry.  She is not on heparin or  Coumadin secondary to history of an aneurysm.  We will adjust her  medications for hypertension and rate control, heart failure.  After  discussion with Dr. Tresa Endo, we will go ahead and add an ARB.  We will also  start low-dose Coreg and see if she can tolerate this.      Abelino Derrick, P.A.    ______________________________  Thereasa Solo. Little, M.D.    Lenard Lance  D:  04/24/2006  T:  04/25/2006  Job:  [id number]

## 2010-12-08 NOTE — Cardiovascular Report (Signed)
Nicole [name], Nicole [name]              ACCOUNT NO.:  [id number]   MEDICAL RECORD NO.:  [id number]          PATIENT TYPE:  INP   LOCATION:  2807                         FACILITY:  [facility]   PHYSICIAN:  Darlin Priestly, MD  DATE OF BIRTH:  19-Sep-1930   DATE OF PROCEDURE:  11/11/2006  DATE OF DISCHARGE:                            CARDIAC CATHETERIZATION   PROCEDURES:  1. Right heart catheterization.  2. Left heart catheterization.  3. Coronary arteriography.  4. Left ventriculogram.  5. Bilateral renal angiogram.   COMPLICATIONS:  None.   INDICATIONS:  Nicole [name] is a 77 year old female patient of Dr. Caprice Kluver, Dr. Smith Mince, also followed by Dr. Ofilia Neas with a history  of questionable non-ischemic cardiomyopathy with EF of approximately  30%, history of diabetes, hyperlipidemia, history of chronic atrial  fibrillation, medical noncompliance, who was admitted on November 08, 2006  with increasing shortness of breath.  She was noted to be markedly  hypertensive, as well as tachycardic.  Because she does have a history  of aortic dilatation, she underwent a CT scan revealing a 4.5 cm  calcified ascending aorta, but no evidence of dissection.  She says  ruled out for MI.  She is now brought for a cardiac catheterization to  rule out significant CAD and right heart pressures.   DESCRIPTIVE INFORMATION:  After giving informed consent, the patient was  brought to the cardiac cath lab where her left groin shaved, prepped and  draped in usual sterile fashion.  ECG monitoring established.  Using  modified Seldinger technique, a #7-French venous sheath into the right  femoral vein, and a #6-French arterial sheath into the right femoral  artery.  Next, under fluoroscopic guidance, a #7-French Swan-Ganz  catheter inflated at the RA, RV, PA and wedge position, and hemodynamic  measures were obtained.  A 6-French diagnostic catheter was used to  perform diagnostic angiography.   The left main  is a large vessel, no significant disease.   The LAD is a large vessel, courses the apex, 3 diagonal branches.  The  LAD has no significant disease.   First and third diagonals are small to medium-size vessels, no specific  disease.   The second diagonal is a medium-sized vessel with no significant  disease.   Left circumflex is a medium-size vessel courses to the AV groove  __________  two obtuse marginal branches.  The AV groove and circumflex  has no significant disease.   First OM is a medium-sized vessel which bifurcates distally with no  significant disease.   Second OM  is small vessel with no significant disease.   The right coronary artery is a large vessel, is dominant.  Gives rise to  the  PDA as well as posterolateral branch.  There is no significant  disease in the RCA.  The PDA is a medium-size vessel with no significant  disease.   PLA is a medium-size vessel with mild 30% mid-vessel narrowing.   Left renal angiogram reveals a 30% proximal left renal artery stenosis.   Right renal angiogram reveals a 30% right ostial stenosis.   Left ventriculogram  reveals moderately depressed EF of 30-35% with  global hypokinesis.  It should be noted the patient is in rapid A fib.  There is least mild-to-moderate regurgitation.   HEMODYNAMIC RESULTS:  RA 20, RV 65/15, PA 65/31, pulmonary capillary  wedge pressure of 28, systemic arterial pressure 190/117, LV __________  pressure 1902/19, LVEDP of 38, cardiac output 4.0, cardiac index at 20,  PA saturation 66%, AO saturation 93%.   CONCLUSIONS:  1. No significant CAD.  2. Moderately-depressed LV systolic function.  3. Moderate mitral regurgitation.  4. Mild bilateral renal artery stenosis.  5. Moderate severe pulmonary hypertension, with elevated pulmonary      capillary wedge pressure.  6. Systemic hypertension.  7. Elevated LVEDP.  8. Cardiac output flow 4.0.  9. Cardiac index 2.8,  10.PA saturation 66%, AO  saturation 93%.      Darlin Priestly, MD  Electronically Signed     RHM/MEDQ  D:  11/11/2006  T:  11/11/2006  Job:  [id number]   cc:   Thereasa Solo. Little, M.D.  Talmadge Coventry, M.D.

## 2010-12-08 NOTE — Consult Note (Signed)
Nicole, [name]                        ACCOUNT NO.:  [id number]   MEDICAL RECORD NO.:  [id number]                   PATIENT TYPE:  REC   LOCATION:  FOOT                                 FACILITY:  [facility]   PHYSICIAN:  Jonelle Sports. Sevier, M.D.              DATE OF BIRTH:  03-16-1931   DATE OF CONSULTATION:  05/14/2002  DATE OF DISCHARGE:                                   CONSULTATION   HISTORY:  This 77 year old black female with type 2 diabetes first diagnosed  three years ago is referred for chronic callus formation on the plantar  aspect of the left hallux.   The patient reports that she injured this toe with a lawnmower some 40 years  ago and required a small skin graft to the plantar aspect of that toe at  that time.  Since then she has tended to have recurrent callus formation and  soreness at the site which has required periodic sharp debridement, which  she usually did herself.  The lesion has recurred now and she has decided to  seek professional help for it.   The patient also complains of interdigital soreness between the fourth and  fifth toes of the right foot.   PAST MEDICAL HISTORY:  Includes hypertension, hyperlipidemia, history of  colon cancer, and known thoracic aneurysm, as well as her diabetes.   ALLERGIES:  IV CONTRAST DYE only.   REGULAR MEDICATIONS:  Include Glucotrol XL, baby aspirin, Plavix, atenolol,  diltiazem, lisinopril, Lipitor, and warfarin.   PHYSICAL EXAMINATION:  Examination today is limited to the distal lower  extremities.  The patient has slight hallux valgus formation bilaterally but  no overlapping of toes and no major bunion formation.  Her feet are without  significant edema.  Skin temperatures are in the low range of normal but are  symmetrical.  Pedal pulses are palpable and there is hair present on the  toes.  Monofilament testing shows the preservation of protective sensation  throughout both feet.   The patient has minor  callus formation laterally at both heels and has an  exophytic dark callus with some hemorrhagic change underlying the  interphalangeal joint of the left hallux.   On the right foot there is a small interdigital corn involving the apposing  surfaces of the fourth and fifth toes with minimal ulceration on the fourth  toe.  In addition, there is a hardened callus dorsolaterally on the right  fifth toe.   Review of her footwear shows that the shoes that she brings with her are  clearly far too narrow in the forefoot region.   DISPOSITION:  1. The patient is given instruction regarding foot care and diabetes     instruction with nurse and physician reinforcement.  2. The callus underlying the interphalangeal joint of the left hallux is     sharply pared, revealing a very soft center.  This is covered with a  DuoDerm dressing and the patient is invited to change this at five- to     six-day intervals and given materials to do so.  3. The callus on the dorsolateral aspect of the right fifth toe is sharply     pared without incident.  The interdigital calluses on the fourth and     fifth toes of the right foot are again likewise pared without incident.     The fourth toe really amounts to a partial thickness debridement in that     there is a shallow ulcer there.  4. The patient is given lambs wool pledget to wear between these fourth and     fifth toes on the right and is encouraged to continue this.  5. The patient is highly advised to consult either [employer] or the     [employer] on [address] to obtain wide toe box shoes that are     clearly adequate in the forefoot width.  6. The patient is given an appointment here in one month that we may review     her new footwear and see about the need for any further management of     these lesions.                                               Jonelle Sports. Cheryll Cockayne, M.D.    RES/MEDQ  D:  05/14/2002  T:  05/14/2002  Job:   [id number]   cc:   Talmadge Coventry, M.D.

## 2010-12-08 NOTE — Discharge Summary (Signed)
. [facility]  Patient:    Nicole [name], PO Visit Number: [id number] MRN: [id number]          Service Type: MED Location: [phone] Attending Physician:  Loreli Dollar Dictated by:   Abelino Derrick, P.A.C. Admit Date:  06/21/2001 Disc. Date: 06/22/01   CC:         Talmadge Coventry, M.D.  Julieanne Manson, M.D.   Discharge Summary  DISCHARGE DIAGNOSES: 1. Paroxysmal atrial fibrillation. 2. Hypertension. 3. Non-insulin dependent diabetes, diet controlled. 4. History of abdominal aortic aneurysm, followed by Dr. Tyrone Sage. 5. History of colon cancer, surgery and chemotherapy in 1999.  HOSPITAL COURSE:  The patient is a 77 year old female seen by Dr. Clarene Duke in the past for palpitations.  Work-up included an echocardiogram and exercise study per the patient, but no atrial fibrillation was ever documented.  She has been treated with atenolol.  She has episodic, short-lived palpitations relieved by rest.  On 06/21/01, she presented to the emergency room with tachycardia which started about 4 p.m. with some shortness of breath.  She received Cardizem in the emergency room with better rate control.  She was admitted to telemetry, started in IV heparin.  She converted spontaneously early in the morning of the 1st.  We feel she can be discharged 06/22/01.  We stopped her Lotrel and started Cardizem CD 120 and put her on Lotensin 10 mg a day.  She was not sure of her home dose of atenolol, but we would like her to take 25 mg a day.  She is somewhat bradycardic in the 50s at discharge. Dr. Domingo Sep, who saw her at discharge feels she should have an echocardiogram and possibly a Cardiolite study as an outpatient.  This will be deferred to Dr. Clarene Duke.  We did feel Coumadin was indicated, as she does have a history of recurrent PAF and had some cardiomegaly on x-ray and has a history of hypertension.  DISCHARGE MEDICATIONS: 1. Atenolol 25 mg  q.d. 2. Cardizem CD 120 q.d. 3. Zoloft, Claritin, and hydrochlorothiazide as taken at home. 4. Plavix 75 mg x 1 day. 5. Baby aspirin q.d. 6. Coumadin 5 mg q.d. 7. Lotensin 10 mg q.d.  LABORATORY DATA:  Sodium 136, potassium 3.2; this was supplemented in the emergency room, BUN 18, creatinine 0.9.  Hemoglobin 14, hematocrit 42.  TSH was 1.19, and troponins were negative x 3.  EKG showed atrial fibrillation with incomplete right bundle-branch block.  Chest x-ray showed cardiomegaly with mild chronic vascular congestion and some mild interstitial disease.  DISPOSITION:  The patient is discharged in stable condition and will follow up with Dr. Clarene Duke.  We will contact his office about this.  She has been given one dose of Plavix before discharge and will take one tomorrow per Dr. Jerre Simon instructions.  She needs a protime later this week. Dictated by:   Abelino Derrick, P.A.C. Attending Physician:  Loreli Dollar DD:  06/22/01 TD:  06/22/01 Job: [id number]

## 2010-12-08 NOTE — Discharge Summary (Signed)
NAMECYAN, CLIPPINGER              ACCOUNT NO.:  [id number]   MEDICAL RECORD NO.:  [id number]          PATIENT TYPE:  INP   LOCATION:  2007                         FACILITY:  [facility]   PHYSICIAN:  Abelino Derrick, P.A.   DATE OF BIRTH:  [birth year]   DATE OF ADMISSION:  11/08/2006  DATE OF DISCHARGE:  11/15/2006                               DISCHARGE SUMMARY   DISCHARGE DIAGNOSES:  1. Dyspnea with mild heart failure on admission, improved at      discharge.  2. Chronic atrial fibrillation, not felt to be a Coumadin candidate in      the past.  3. Nonischemic cardiomyopathy, catheterization this admission showing      essentially normal coronaries with an EF of 30-35%.  4. Hypertension, uncontrolled on admission, there was some compliance      issues.  5. Treated hypothyroidism.  6. Noninsulin dependent diabetes.   HOSPITAL COURSE:  The patient is a 77 year old female followed by Dr.  Clarene Duke and Dr. Smith Mince with a history of atrial fibrillation and heart  failure and nonischemic cardiomyopathy.  Recent blood pressure was up to  175/112.  Patient was admitted November 08, 2006 with increasing dyspnea.  She was started on IV heparin and Cardizem IV for rate control.  Her  heart rate dropped into the 40s with some symptoms and Cardizem was  discontinued.  She apparently was on some diltiazem at home and we  stopped this as well and put her on Norvasc.  Catheterization was done  November 11, 2006 which revealed no significant coronary disease with an EF  of 30-35%.  Patient was transferred to telemetry.  Her heart rate was  monitored.  She was in atrial fibrillation and had some slow response  and her diltiazem was discontinued prior to discharge.  We feel she can  be discharged November 15, 2006.   MEDICATIONS AT DISCHARGE:  1. Cozaar 100 mg once a day.  2. Metformin 500 mg b.i.d.  3. Coreg 12.5 mg b.i.d.  4. Aspirin 81 mg a day.  5. Plavix 75 mg a day.  6. Norvasc 10 mg a day.  7.  Lipitor 40 mg a day.  8. Zetia 10 mg a day.  9. Zyloprim 100 mg a day.  10.Synthroid 0.05 mg a day.  11.Lanoxin 0.125 mg a day.  12.Furosemide 20 mg a day.   LABS:  Sodium 142.  Potassium 3.9.  BUN 16.  Creatinine 1.1.  White  count 8.5.  Hemoglobin 14.  Hematocrit 42.1.  Platelets 259.  INR 1.1 on  admission.  LFTs are normal.  Hemoglobin A1c was 7.2.  CK-MB and  troponins are negative.  BNP was 900 on admission, 434 at discharge.  Lipid panel reveals a cholesterol of 149, triglycerides 97, HDL 36, LDL  94.  TSH is 4.19.   CT angiogram of the chest on admission showed a small right pleural  effusion cardiomegaly with slight diltation of the ascending aorta of  the maximum diameter of 4.5 cm which is essentially unchanged from the  previous studies.   Abdominal CT showed some edema in  the gallbladder and a single gallstone  but no other abnormality.   EKG reveals atrial fibrillation with controlled ventricular response.   DISPOSITION:  The patient is discharged in stable condition.  She will  followup with Dr. Clarene Duke and Dr. Smith Mince.      Abelino Derrick, P.ALenard Lance  D:  11/15/2006  T:  11/15/2006  Job:  [id number]   cc:   Talmadge Coventry, M.D.

## 2010-12-08 NOTE — Discharge Summary (Signed)
Nicole [name], Nicole [name]              ACCOUNT NO.:  [id number]   MEDICAL RECORD NO.:  [id number]          PATIENT TYPE:  INP   LOCATION:  2015                         FACILITY:  [facility]   PHYSICIAN:  Raymon Mutton, P.A. DATE OF BIRTH:  [birth year]   DATE OF ADMISSION:  04/24/2006  DATE OF DISCHARGE:  04/29/2006                                 DISCHARGE SUMMARY   DISCHARGE DIAGNOSES:  1. Chronic atrial fibrillation, not a Coumadin candidate.  2. Renal insufficiency--resolved.  3. Acute congestive heart failure--significantly improved.  4. Episode of bradycardia during this admission--resolved.  5. Peripheral vascular disease with ascending aortic aneurysm.  6. Non-insulin-dependent diabetes mellitus.   HISTORY OF PRESENT ILLNESS/HOSPITAL COURSE:  This is a 77 year old female,  who was followed by Dr. Clarene Duke in our office and made a patient of Dr.  Talmadge Coventry, presented to the emergency room with complaints of  severe shortness of breath.  She has no prior history of coronary artery  disease or myocardial infarction, and in 2005 her Cardiolite stress test  done was negative.   She was felt to be not a Coumadin candidate because of a history of  ascending aortic aneurysm that is followed up by Dr. Tyrone Sage and last seen  by Dr. Clarene Duke in our office in December 2006.  Her complaints mainly were  shortness of breath, tachycardia, and weakness.  Shortness of breath was  progressively increasing.  After the dose of Lasix IV 80 mg in the emergency  room and bolus of Cardizem, the patient's condition significantly improved.  She was admitted to the telemetry unit in the hospital and ruled out for  myocardial infarction by serial enzymes.  Thursday in the hospital, cardiac  critical care was called emergently because of the patient's bradycardia.  Her heart rate dropped to 40s and 50s, and she became severely hypotensive  with systolic blood pressure being 62-13.  Diltiazem drip was  discontinued  and fluid bolus given to the patient.  EKG showed deep T-wave inversion in  leads I, aVL, and V2 and biphasic T waves in the precordial leads, leads V3-  V5, peak T waves were observed in leads III and aVF, and all this was  thought secondary to volume depletion.   The next morning, the patient was rounded by Dr. Clarene Duke and decision was  made to proceed forward with nuclear Myoview study.  It did not reveal any  inducible ischemia, but wall motion and ejection fraction were not assessed  because the patient was in atrial fibrillation.   In October 4, the patient had chest CT and it was negative for aortic  dissection and pulmonary embolism.   On April 24, 2006, the day of admission, her BNP was 478 and then it  decreased to 183.  Prior to her discharge, BNP again was elevated to 597,  and she was given an extra dose of Lasix and had a good urinary output.   On the day of her discharge, Dr. Elsie Lincoln saw the patient and considered her  to be stable for discharge home.  She did not have any complaints of chest  pain and admitted to having significant improvement with her breathing.   DISCHARGE MEDICATIONS:  1. Plavix 75 mg daily.  2. Metformin 1000 mg b.i.d.  3. Lipitor 40 mg daily.  4. Zetia 10 mg daily.  5. Aspirin 81 mg daily.  6. Allopurinol 100 mg daily.  7. Glucotrol 10 mg daily.  8. Cozaar 25 mg daily.  9. Lasix 20 mg daily.  10.Coreg 3.125 mg b.i.d.  11.Cardizem CD 120 mg daily.   DISCHARGE DIET:  A low-salt, low-fat, low-cholesterol diet, and patient was  instructed to restrict fluids to 1000 mL a day.   Patient will have BNP checked next week and followup appointment will be  scheduled with Dr. Clarene Duke on October 16 at 2:15.      Raymon Mutton, P.A.     MK/MEDQ  D:  04/29/2006  T:  04/30/2006  Job:  [id number]   cc:   [facility]  Thereasa Solo. Little, M.D.  Talmadge Coventry, M.D.

## 2010-12-08 NOTE — Discharge Summary (Signed)
NAMEJESSAMINE, Nicole [name]              ACCOUNT NO.:  [id number]   MEDICAL RECORD NO.:  [id number]          PATIENT TYPE:  INP   LOCATION:  0357                         FACILITY:  [facility]   PHYSICIAN:  Mallory Shirk, MD     DATE OF BIRTH:  [birth year]   DATE OF ADMISSION:  07/20/2004  DATE OF DISCHARGE:  07/22/2004                                 DISCHARGE SUMMARY   PRIMARY CARE PHYSICIAN:  Talmadge Coventry, M.D.   DISCHARGE DIAGNOSES:  1.  Nausea and vomiting.  2.  Atrial fibrillation with a rapid ventricular response.  3.  Hypertension.  4.  Urinary tract infection.  5.  Renal insufficiency.  6.  Diabetes mellitus.   DISCHARGE MEDICATIONS:  1.  Ciprofloxacin 500 mg, one tab p.o. b.i.d., to end on July 23, 2004,      for a total of three days of Cipro.  2.  Atenolol 50 mcg p.o. daily.  3.  Clonidine 0.1 mg p.o. b.i.d.  4.  Norvasc 10 mg p.o. daily.  5.  Lipitor 40 mg p.o. daily.  6.  Lisinopril 20 mg p.o. daily.  7.  Metformin 500 mg p.o. b.i.d.  8.  Indomethacin 50 mg p.o. t.i.d. p.r.n.  9.  Glucotrol XL 10 mg p.o. daily.  10. Aspirin 81 mg p.o. daily.   FOLLOWUP:  1.  Follow up with Dr. Talmadge Coventry in two to three days of discharge.      Review all medications with Dr. Smith Mince.  Discuss with her about      restarting Coumadin therapy.  2.  Cardiology, as scheduled prior to this admission.   HISTORY OF PRESENT ILLNESS:  The patient is a very pleasant 77 year old  African/American woman who presented to the emergency room on July 20, 2004, with the complaint of intractable nausea and vomiting.  The patient  states that this started after she over-ate some stuff she was not  supposed to eat.  She came in with the complaint of five to six episodes of  nausea and vomiting.  No diarrhea.  She was also unable to eat for two to  three days after the event.  She had some complaint of hoarseness and sore  throat, secondary to the nausea and vomiting.   PAST  MEDICAL HISTORY:  1.  The patient has a history of colon cancer, status post resection and      chemotherapy in 1999.  2.  She also has a history of an ascending aortic aneurysm, which is      followed by cardiovascular surgery.  Last time it was checked, she was      told that the aneurysm was stable.  3.  Paroxysmal atrial fibrillation.  Was on Coumadin therapy which was      stopped per the records, a couple of years ago for surgery, and has not      been renewed per the records; however, the patient states that she has      been taking Coumadin.  4.  Hypertension.  5.  Diabetes mellitus.  6.  Dyslipidemia.   MEDICATIONS ON  ADMISSION:  1.  Atenolol 50 mg p.o. daily.  2.  Clonidine 0.1 mg p.o. b.i.d.  3.  Norvasc 10 mg p.o. daily.  4.  Lipitor 40 mg p.o. daily.  5.  Lisinopril 20 mg p.o. daily.  6.  Metformin 500 mg p.o. b.i.d.  7.  Indomethacin 50 mg p.o. t.i.d. p.r.n. pain.  8.  Glucotrol XL 10 mg p.o. daily.  9.  Aspirin 81 mg p.o. daily.   ALLERGIES:  The patient is allergic to IV DYE, AS WELL AS TO Z-PAK.   PHYSICAL EXAMINATION:  VITAL SIGNS:  Blood pressure 168/94, pulse 86,  respirations 20, temperature 98.1 degrees.  O2 saturation 98% on room air.  GENERAL:  Alert and oriented x3.  She did not appear to be in acute  distress.  She was cooperative with the examination.  HEENT:  Normocephalic and atraumatic.  Extraocular muscles intact.  Oropharynx not erythematous.  NECK:  Supple, no jugular venous distention, no LAD.  LUNGS:  Clear to auscultation bilaterally.  No wheezes or rales.  CARDIOVASCULAR:  S1 and S2.  A regular rate and rhythm.  No murmurs, rubs,  or gallops.  ABDOMEN:  Soft, positive bowel sounds.  Some mild epigastric tenderness.  No  pulsatile mass felt.  EXTREMITIES:  No clubbing, cyanosis or edema.  NEUROLOGIC:  Nonfocal.   LABORATORY DATA:  Electrocardiogram:  Atrial flutter with variable AV block,  with tachycardia.  Point of care cardiac enzymes  negative.  WBC 7.4, hematocrit 55, platelets  346, MCV 88.  Sodium 137, potassium 3.9, carbon dioxide 44, BUN 16,  creatinine 1.5, glucose 158.  AST and ALT within normal limits.  Calcium  10.5.  Urinalysis:  Positive for leukocyte esterase with 3-6 WBC's.  An ultrasound of the abdomen showed a gallstone.  No hepatic mass  identified.  The pancreas was poorly visualized.  An acute abdominal series:  Chronic cardiomegaly with prominence of the main  pulmonary artery.  Lungs were clear.  No free air or appreciable free fluid  in the abdomen.  Bowel gas pattern normal.  A small amount of air is  scattered throughout the non-distended colon.  No dilated loops of bowel in  the small bowel.  No significant bony abnormalities.   HOSPITAL COURSE:  The patient was admitted to a monitored bed.  #1 - NAUSEA AND VOMITING:  The patient was initially kept n.p.o.  She was  provided Zofran p.r.n. nausea and vomiting.  She had no episodes of nausea  and vomiting during her hospital stay.  Her diet was restarted on clear,  which she tolerated.  On the day of discharge the patient had tolerated  breakfast and lunch with regular meals, without nausea and vomiting.  This  episode of nausea and vomiting most likely was triggered by some ingestion  of some food that did not agree with the patient.  #2 - DIABETES MELLITUS:  The patient's diabetes was managed with Glipizide  and sliding scale insulin.  Her home medications of Glucotrol and Glipizide  will be resumed on discharge.  #3 - ATRIAL FIBRILLATION WITH FLUTTER:  This was rate-controlled, with the  rate in the 60's to 80's.  The patient states that she has been taking  Coumadin; however, the records indicate that she has not been taking it.  The primary care physician's office was called, as was Dr. Gaspar Garbe B.  Little's, cardiology office.  Both offices indicated that the patient is not on Coumadin for the last two years,  when the patient had cataract  surgery,  and this Coumadin was stopped, and she has not been resumed since.  The  resumption of this Coumadin for the management of atrial fibrillation at  this point is deferred to the PCP, whom the patient will be seeing in two or  three days from discharge.  #4 -  HYPERTENSION:  The patient's blood pressure was well-controlled with  her home medications.  These have been continued on discharge.  #5 - RENAL INSUFFICIENCY:  The patient's admission BUN and creatinine was  16/1.5, which resolved upon hydration.  On the day of discharge the  patient's BUN and creatinine were 11/1.2.  #6 - URINARY TRACT INFECTION:  The patient was started on IV Cipro.  She  will be discharged with 500 mg p.o. b.i.d. of Cipro, to complete a three-day  course of antibiotics.   DISCHARGE INSTRUCTIONS:  1.  The patient was advised to follow up with her PCP and with cardiology as      scheduled.  2.  She was also advised to take her medications as prescribed.  3.  She was advised to return to the emergency room immediately upon the      onset of nausea or vomiting, shortness of breath, chest pain, or any      other symptom that may need immediate medical attention.   DISPOSITION:  The patient will be discharged home with her family.      GDK/MEDQ  D:  07/22/2004  T:  07/22/2004  Job:  [id number]   cc:   Talmadge Coventry, M.D.  [address]  [place]  [place] [address]  Fax: [phone]   Thereasa Solo. Little, M.D.  [address]  [address]  [place]  [place] [address]  Fax: [phone]   Bernette Redbird, M.D.  [address]  [place], [place] [address]  Fax: [phone]   Sheliah Plane, MD  [address]  [place]  [place] [address]

## 2010-12-08 NOTE — H&P (Signed)
NAMEJADAYA, SOMMERFIELD              ACCOUNT NO.:  [id number]   MEDICAL RECORD NO.:  [id number]          PATIENT TYPE:  INP   LOCATION:  0357                         FACILITY:  [facility]   PHYSICIAN:  Hettie Holstein, D.O.    DATE OF BIRTH:  1931/02/27   DATE OF ADMISSION:  07/20/2004  DATE OF DISCHARGE:                                HISTORY & PHYSICAL   PRIMARY CARE PHYSICIAN:  Talmadge Coventry, M.D.   CARDIOLOGIST:  Thereasa Solo. Little, M.D.   GASTROENTEROLOGIST:  Bernette Redbird, M.D.   VASCULAR SURGEON:  Sheliah Plane, M.D.   CHIEF COMPLAINT:  Intractable nausea and vomiting.   HISTORY OF PRESENTING ILLNESS:  This is a pleasant 77 year old African-  American female with a past medical history significant for colon cancer in  1999 status post resection and chemotherapy at that time who complains of  intractable nausea and vomiting.  Symptoms began approximately on Saturday  with the initial complaint of hoarseness and a sore throat subsequently  followed by nausea and vomiting.  This has persisted despite rest.  She has  unable to keep any type of food down.  She has had about five to six  episodes on the day of admission.  She has been unable to eat for the past  few days.   Dr. Ethelda Chick evaluated Mrs. Mcgurk whose concern was that her EKG was  significant for atrial flutter; and, on review of her history she carries  the diagnosis of paroxysmal atrial fibrillation and is on chronic Coumadin  therapy according to the patient.  Coumadin is not on her medication list;  however, she is adamant that she takes Coumadin and is followed I that  regard.   In any event in the emergency department she was noted to have a potassium  of 2.9.  She appeared to be volume contracted.  Records from the past were  reviewed and each chart had a CT scan for evaluation of her aortic aneurysm  in addition to surveillance post cancer of the colon.  In any event she does  have a history of IVP DYE  ALLERGY and she is not been imaged at this time.  She will have await prophylactic steroid therapy I addition to right upper  quadrant ultrasound for the morning.  Her initial LFTs were unremarkable,  except for a mildly elevated lipase.   PAST MEDICAL HISTORY:  The patient has a history of:  1.  Paroxysmal atrial fibrillation on chronic Coumadin therapy.  2.  History of hypertension.  3.  Colon cancer in 1999 status post resection and chemotherapy.  4.  History of diabetes mellitus.  5.  Dyslipidemia.   PAST SURGICAL HISTORY:  The patient's surgical history is significant for:  1.  Partial hysterectomy secondary to menorrhagia.  2.  History of colon resection as noted above.   MEDICATIONS:  Under medications as provided by the patient; she states she  takes:  1.  Atenolol 50 mg daily,  2.  Clonidine 0.1 mg b.i.d.  3.  Norvasc 10 mg daily.  4.  Lipitor 40 mg daily.  5.  Lisinopril 20 mg daily.  6.  Metformin 500 mg b.i.d.  7.  Indomethacin 50 mg t.i.d. p.r.n.  8.  Glucotrol XL 10 mg daily.  9.  Aspirin 81 mg a day.   ALLERGIES:  The patient has allergies to IV DYE as well as to Z-PAK; she  develops allergic reactions to these.   SOCIAL HISTORY:  The patient is lifetime nonsmoker.  She is currently  employed as a Associate Professor.  She only drinks occasional alcohol.  She lives  alone.   FAMILY HISTORY:  Mother died at the age of 22; no history of a disease at  early age.  Father died at 64 with heart disease acquires at a late age.   REVIEW OF SYSTEMS:  The patient denies any weight loss, fevers and chills.  She does have the nausea and vomiting as described in the HPI.  She denies  any chest pain or shortness of breath.  She does have some upper epigastric  discomfort.  She states she is followed by Dr. Julieanne Manson with regards to  her paroxysmal atrial fibrillation in the past.  Otherwise further review of  systems is unremarkable.   PHYSICAL EXAMINATION:  VITAL SIGNS:   The patient's blood pressure is 168/94.  GENERAL APPEARANCE:  In general she is alert and oriented.  She does have  episodes of mild retching.  She does not appear to be in acute distress.  HEENT:  Examination reveals the head to be normocephalic and atraumatic.  Extraocular muscles are intact.  Oropharynx is clear without exudates.  NECK:  The neck is supple and nontender with no palpable mass.  HEART:  Cardiovascular exam reveals normal S1 and S2.  No appreciable S3, S4  or murmur.  LUNGS:  The lungs are clear to auscultation bilaterally.  ABDOMEN:  The abdomen is soft and nontender.  There is some upper quadrant  mild tenderness; however, there is no rebound and no guarding.  No pulsatile  mass.  EXTREMITIES:  There is no evidence of edema.  Peripheral pulses are  symmetrical.  She moves all four extremities without difficulty.   LABORATORY DATA:  EKG revealed atrial flutter with variable AV block and she  was tachycardiac.  Her initial point of care marker revealed no elevation in  troponin I and a myoglobin of 358; this was performed at 11:40 P.M. Lipase  was 63, mildly elevated.  Her WBC was 7.4, her hemoglobin was 55, platelet  count of 346,000 and MCV of 88.  Sodium 137, potassium 3.9, CO2 of 44, BUN  16, creatinine 1.5, and glucose 152.  AST and ALT within normal limits,  calcium was elevated at 10.5; however, this was felt to be likely due to  volume contraction; would recommend repeating a calcium level when she is  more hydrated,  INR is pending.  Urinalysis revealed 3-6 WBCs with positive  leukocyte esterase and pH was 5.5.   IMPRESSION:  1.  Intractable nausea and vomiting.  2.  Abdominal pain.  3.  Gallstones on previous imaging study.  4.  INTRAVENOUS PYELOGRAM DYE ALLERGY.  5.  Hyperlipidemia.  6.  Dehydration.  7.  Hypercalcemia secondary to #5 with hypokalemia; I suspect this can be     corrected with supplementation.  8.  Acute renal failure.  9.  Urinary tract  infection.  10. Mildly elevated myoglobin.  11. History  of colon cancer.  12. Hypertension, poorly controlled.   PLAN:  Our plan at this time is:  1.  We are going to admit Mrs. Houchins to a telemetry floor.  2.  Administer antiemetics.  3.  Continue routine studies including right upper quadrant ultrasound to      rule out biliary obstruction.  4.  Check her GGT as well.  5.  Would plan on checking a CT especially if the patient does not improve      with hydration.  6.  We will replete potassium as indicated.  7.  Treat her urinary tract infection.  8.  Follow her course clinically.  9.  She may need consultation with the general surgeons for gallstones.     Eric   ESS/MEDQ  D:  07/21/2004  T:  07/21/2004  Job:  [id number]   cc:   Talmadge Coventry, M.D.  [address]  [place]  [place] [address]  Fax: [phone]

## 2011-01-03 IMAGING — CR DG CHEST 1V
1 series · 1 of 1 positions shown · non-contrast
Comparison: Chest CTs radiographs 12/26/2008 and earlier.

CLINICAL DATA: 77-year-old female with dizziness, weakness, altered
mental status.

CHEST - 1 VIEW

[w chest pa]
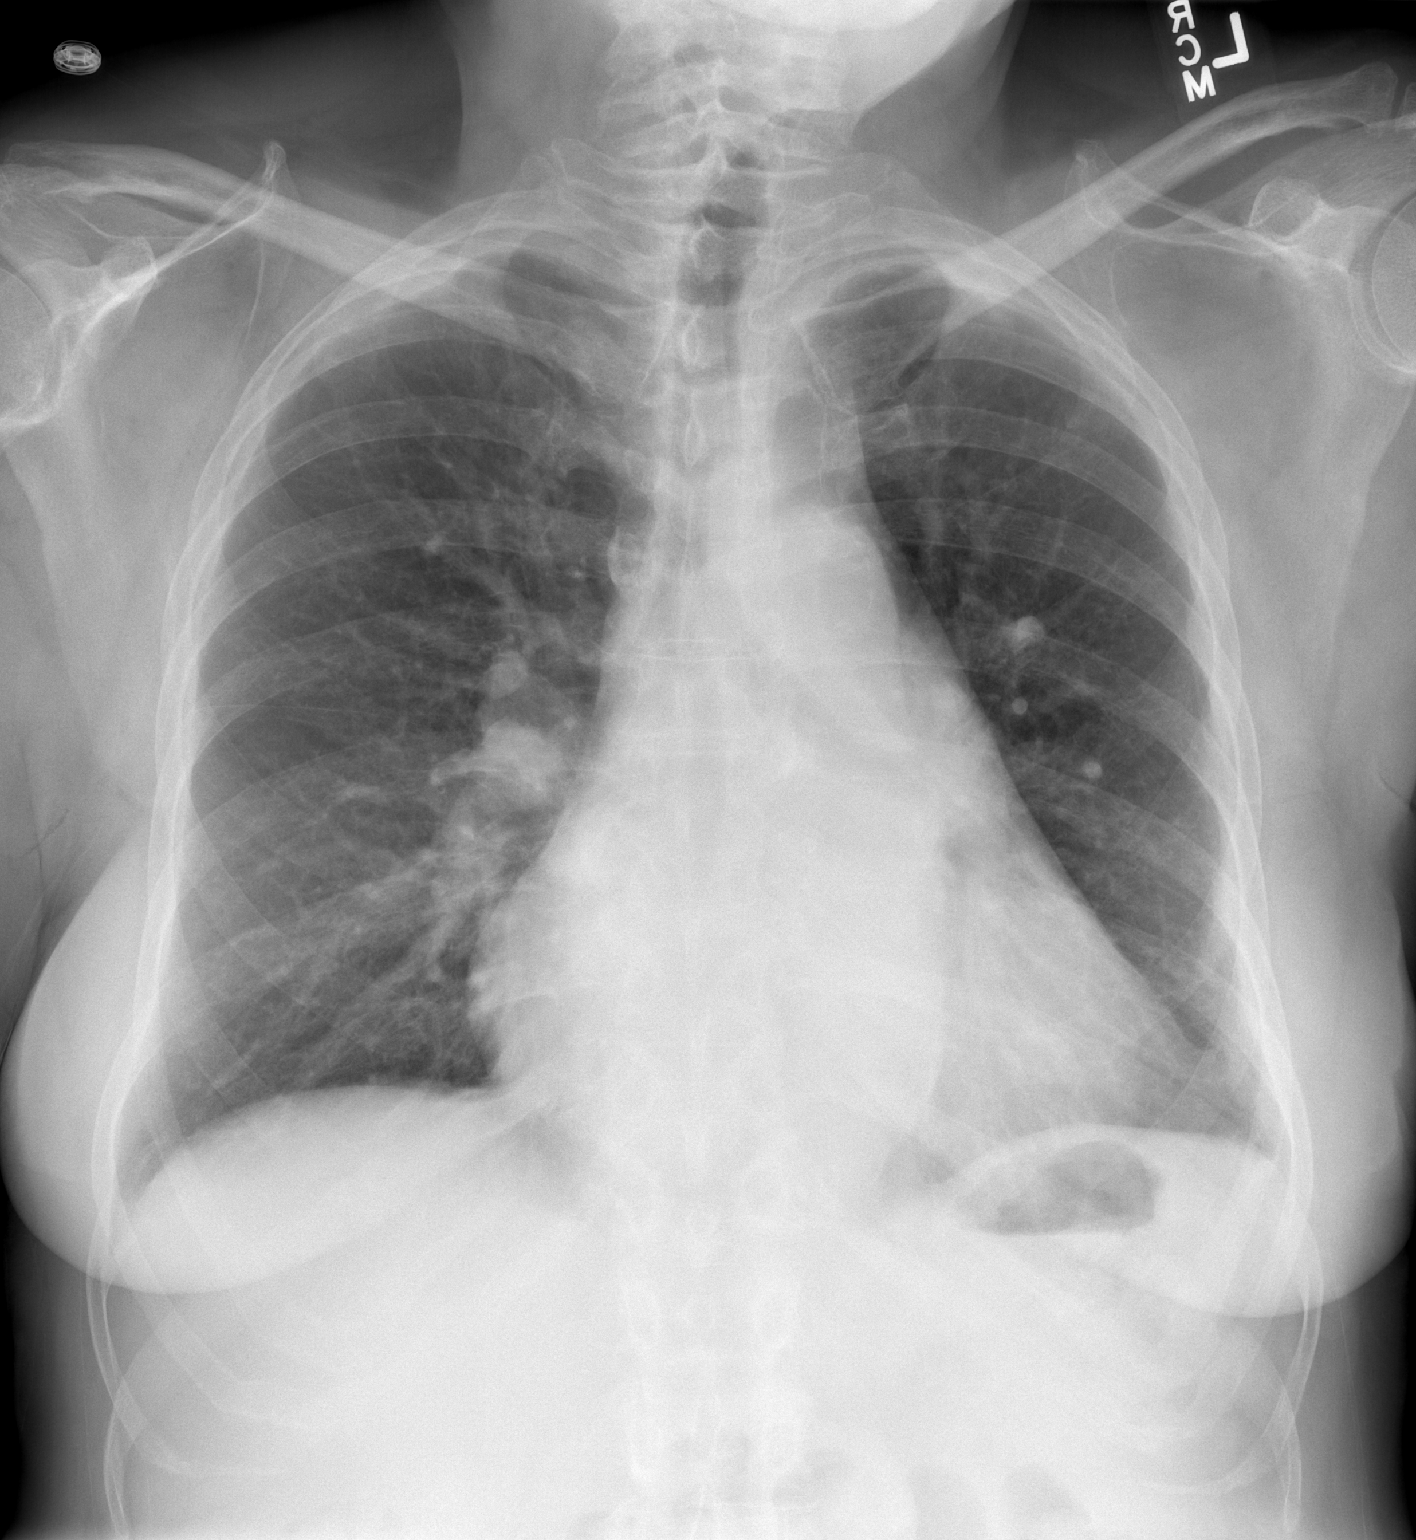

[1 of 1 positions shown; findings below may reference images not displayed]

FINDINGS: Stable cardiac size and mediastinal contours with
cardiomegaly and ectatic thoracic aorta as previously noted by CT.
Prominent central pulmonary arteries as well.  No pneumothorax,
pulmonary edema, pleural effusion, consolidation, or confluent
airspace opacity identified.
IMPRESSION: Stable. No acute cardiopulmonary abnormality.

## 2011-01-03 IMAGING — CT CT HEAD W/O CM
1 of 2 series · 16 of 30 positions shown, 20 images · non-contrast
Comparison: 05/11/2003

CLINICAL DATA: Dizziness.  Altered mental status.  Right-sided
weakness.

CT HEAD WITHOUT CONTRAST
TECHNIQUE: Contiguous axial images were obtained from the base of
the skull through the vertex without contrast.

[Series 2: brain · axial · 0.45mm/px · z∈[+109,+258]mm · 16 of 32 slices shown, 20 images]
[im 2/32  brain]
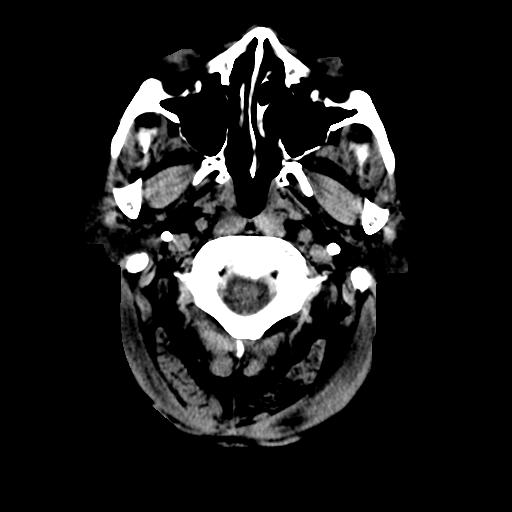
[im 2/32  bone]
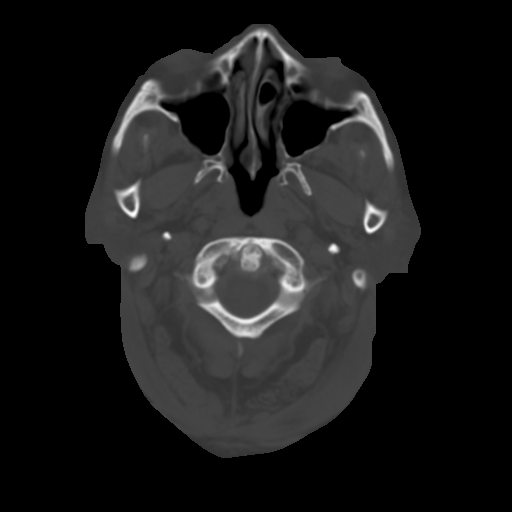
[im 4/32  brain]
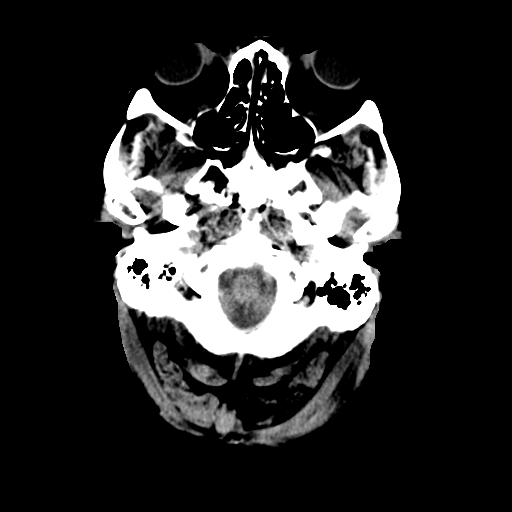
[im 6/32  brain]
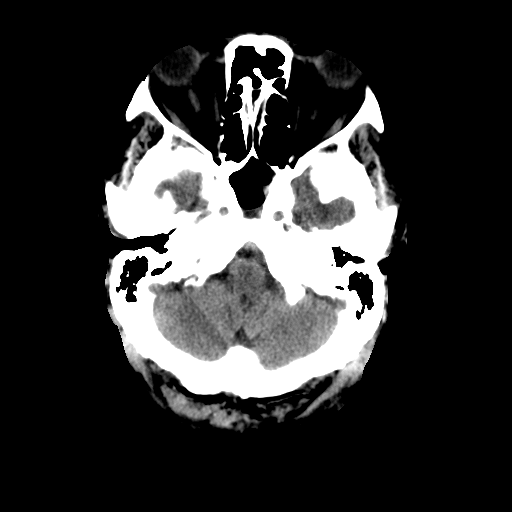
[im 8/32  brain]
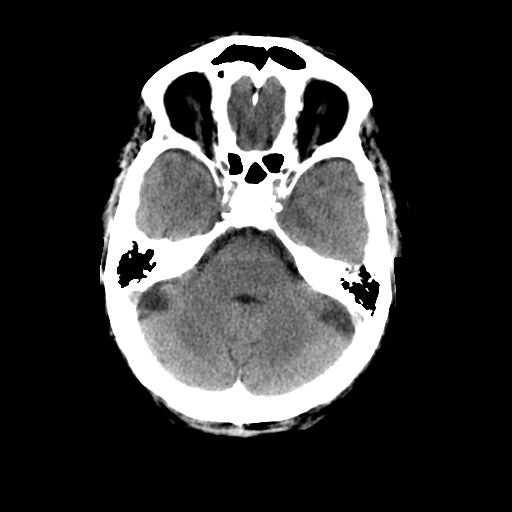
[im 10/32  brain]
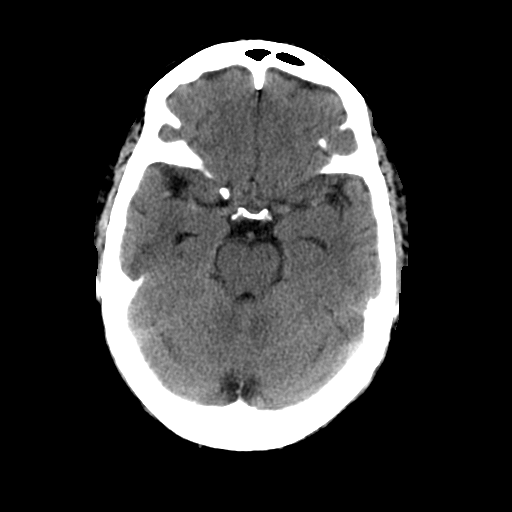
[im 10/32  bone]
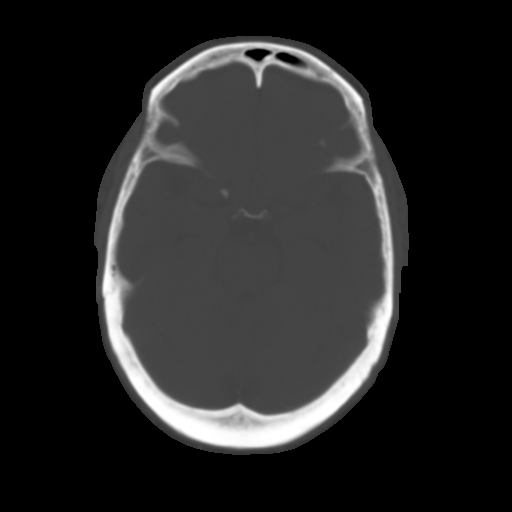
[im 11/32  brain]
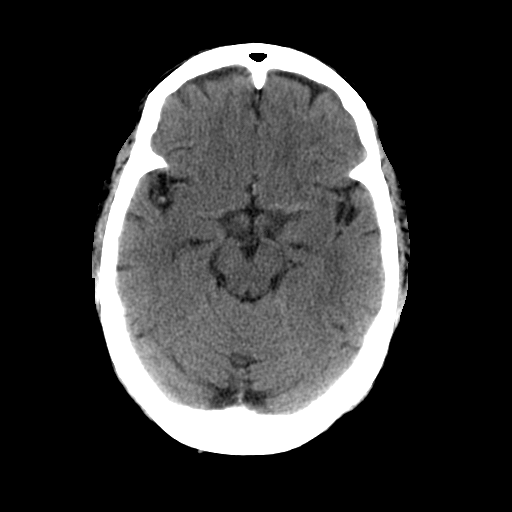
[im 13/32  brain]
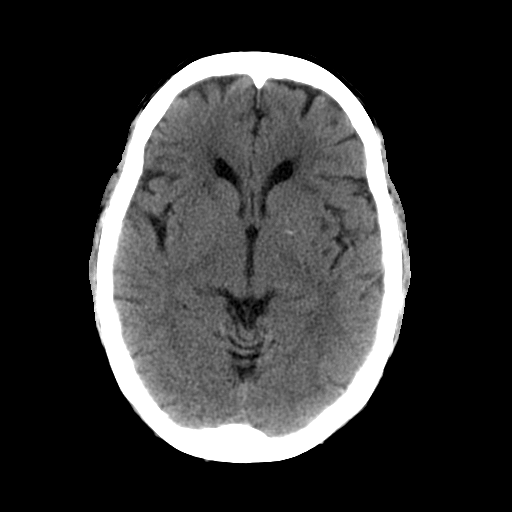
[im 15/32  brain]
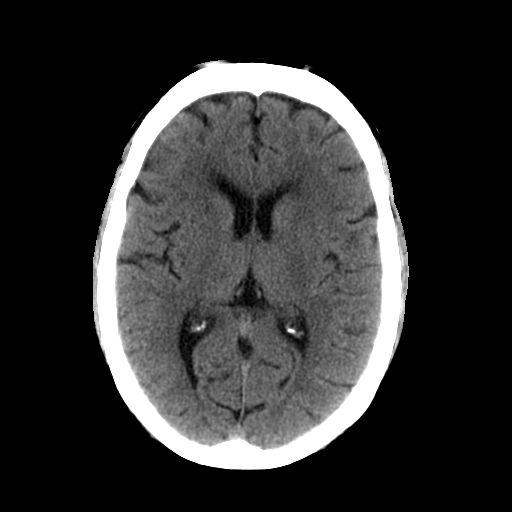
[im 17/32  brain]
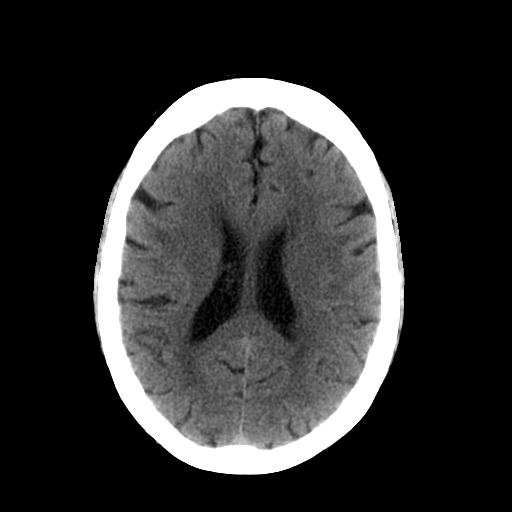
[im 17/32  bone]
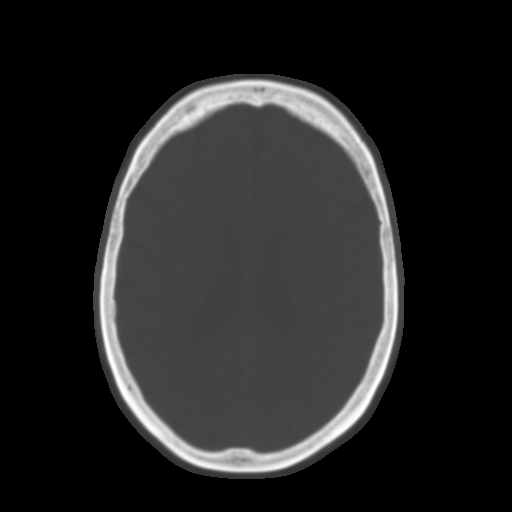
[im 19/32  brain]
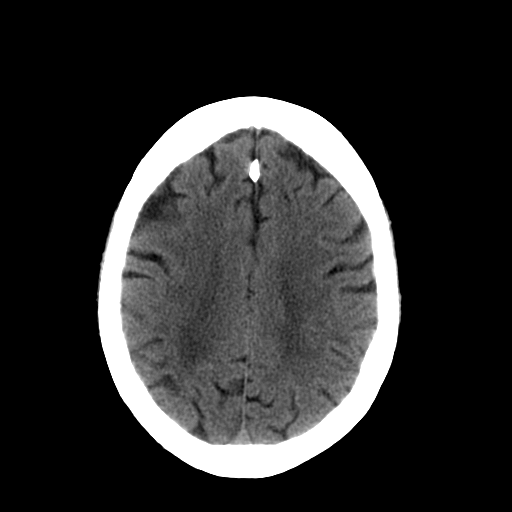
[im 21/32  brain]
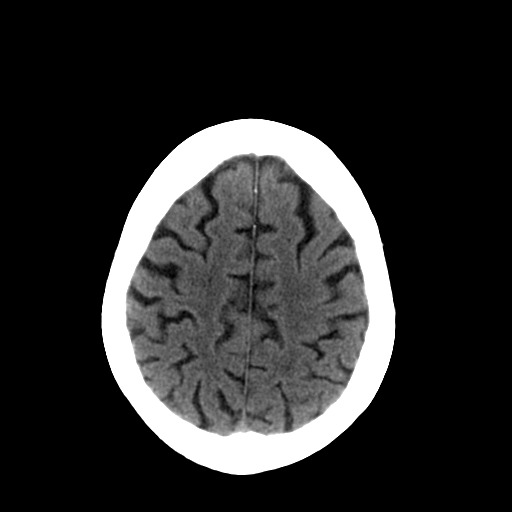
[im 22/32  brain]
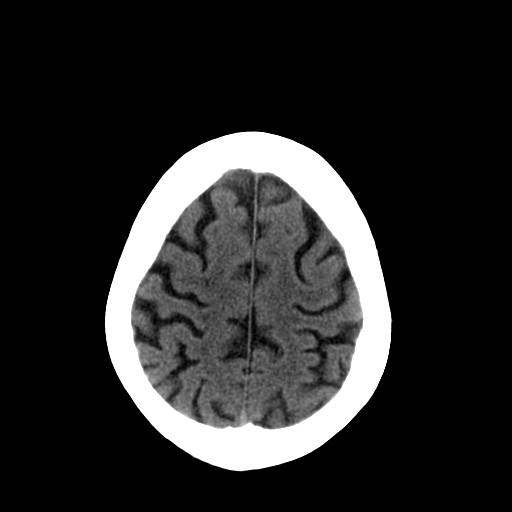
[im 24/32  brain]
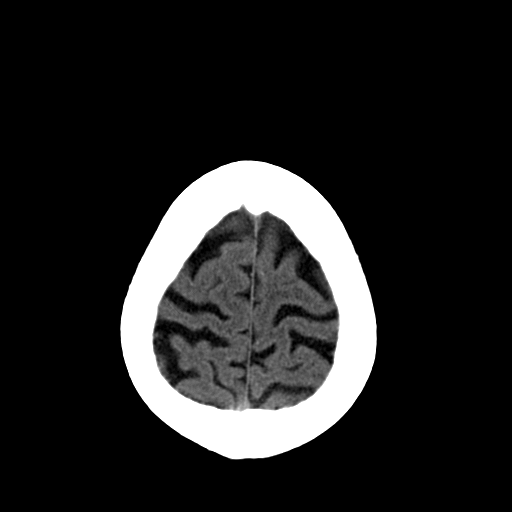
[im 24/32  bone]
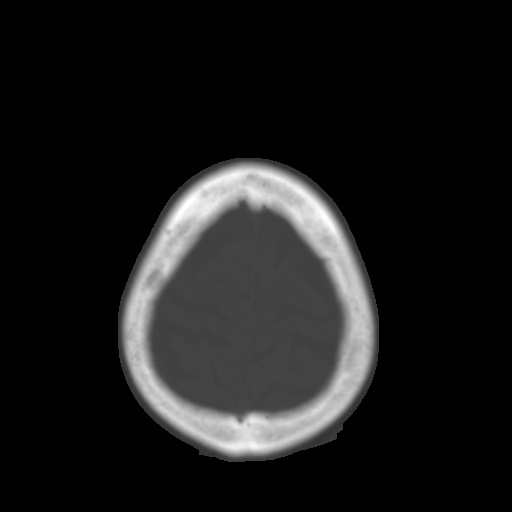
[im 26/32  brain]
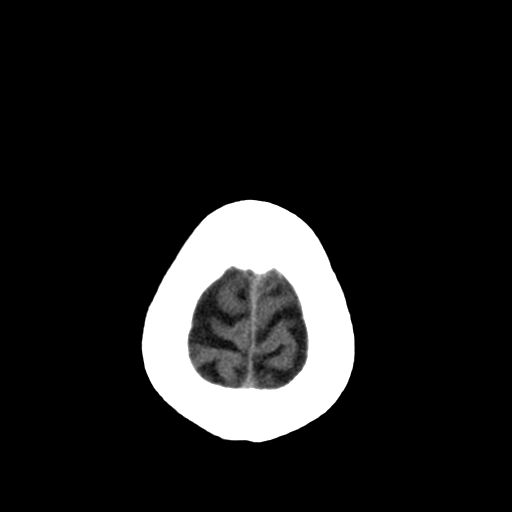
[im 28/32  brain]
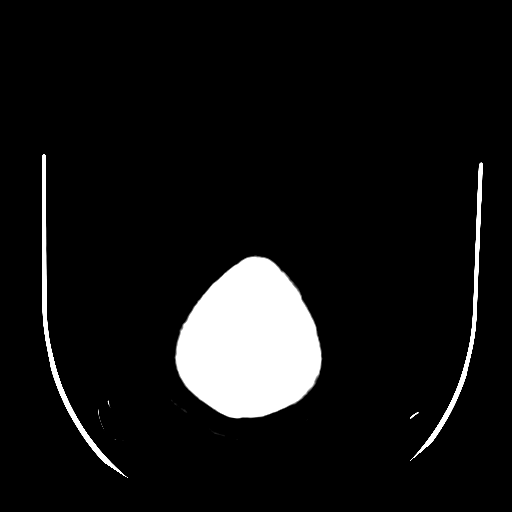
[im 30/32  brain]
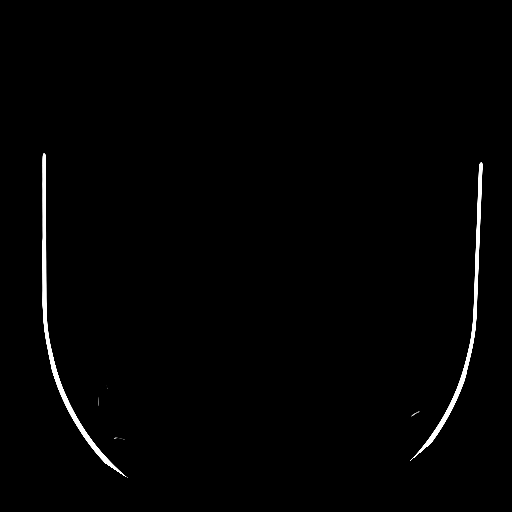

[16 of 30 positions shown; findings below may reference images not displayed]

FINDINGS: There is generalized brain atrophy.  There are chronic
appearing small vessel changes throughout the hemispheric white
matter.  There are old small vessel changes also in the basal
ganglia and thalami.  No sign of acute infarction, mass lesion,
hemorrhage, hydrocephalus or extra-axial collection.  I considered
the possibility of a dense middle cerebral artery on the right, but
that does not seem to match with the given history and the finding
appears similar in 1335.  The calvarium is unremarkable.  The
sinuses, middle ears and mastoids are clear.
IMPRESSION: Atrophy and extensive chronic small vessel disease.  No sign of
acute or reversible process.

## 2011-04-26 ENCOUNTER — Encounter: Payer: Self-pay | Admitting: Medical Oncology

## 2011-05-16 ENCOUNTER — Other Ambulatory Visit: Payer: Self-pay | Admitting: Internal Medicine

## 2011-05-16 DIAGNOSIS — R221 Localized swelling, mass and lump, neck: Secondary | ICD-10-CM

## 2011-05-18 ENCOUNTER — Other Ambulatory Visit: Payer: Self-pay

## 2011-05-18 ENCOUNTER — Inpatient Hospital Stay: Admission: RE | Admit: 2011-05-18 | Payer: Self-pay | Source: Ambulatory Visit

## 2011-05-28 ENCOUNTER — Other Ambulatory Visit: Payer: Self-pay | Admitting: Lab

## 2011-05-28 ENCOUNTER — Ambulatory Visit: Payer: Self-pay | Admitting: Oncology

## 2011-06-10 ENCOUNTER — Encounter (HOSPITAL_COMMUNITY): Payer: Self-pay | Admitting: Emergency Medicine

## 2011-06-10 ENCOUNTER — Emergency Department (HOSPITAL_COMMUNITY)
Admission: EM | Admit: 2011-06-10 | Discharge: 2011-06-10 | Disposition: A | Discharge status: Home / Self Care | Payer: Medicare Other | Attending: Otolaryngology | Admitting: Otolaryngology

## 2011-06-10 DIAGNOSIS — Z79899 Other long term (current) drug therapy: Secondary | ICD-10-CM | POA: Insufficient documentation

## 2011-06-10 DIAGNOSIS — R040 Epistaxis: Secondary | ICD-10-CM | POA: Insufficient documentation

## 2011-06-10 DIAGNOSIS — I10 Essential (primary) hypertension: Secondary | ICD-10-CM | POA: Insufficient documentation

## 2011-06-10 DIAGNOSIS — F411 Generalized anxiety disorder: Secondary | ICD-10-CM | POA: Insufficient documentation

## 2011-06-10 DIAGNOSIS — Z85038 Personal history of other malignant neoplasm of large intestine: Secondary | ICD-10-CM | POA: Insufficient documentation

## 2011-06-10 DIAGNOSIS — J3489 Other specified disorders of nose and nasal sinuses: Secondary | ICD-10-CM | POA: Insufficient documentation

## 2011-06-10 DIAGNOSIS — Z7982 Long term (current) use of aspirin: Secondary | ICD-10-CM | POA: Insufficient documentation

## 2011-06-10 DIAGNOSIS — E119 Type 2 diabetes mellitus without complications: Secondary | ICD-10-CM | POA: Insufficient documentation

## 2011-06-10 HISTORY — DX: Essential (primary) hypertension: I10

## 2011-06-10 LAB — POCT I-STAT, CHEM 8
Calcium, Ion: 1.15 mmol/L (ref 1.12–1.32)
Creatinine, Ser: 1.2 mg/dL — ABNORMAL HIGH (ref 0.50–1.10)
Glucose, Bld: 122 mg/dL — ABNORMAL HIGH (ref 70–99)
HCT: 36 % (ref 36.0–46.0)
Hemoglobin: 12.2 g/dL (ref 12.0–15.0)
Potassium: 4.4 mEq/L (ref 3.5–5.1)
TCO2: 29 mmol/L (ref 0–100)

## 2011-06-10 MED ORDER — LIDOCAINE-EPINEPHRINE 2 %-1:100000 IJ SOLN
5.0000 mL | Freq: Once | INTRAMUSCULAR | Status: DC
  Filled 2011-06-10: qty 5.1

## 2011-06-10 MED ORDER — CEPHALEXIN 500 MG PO CAPS: 500.0000 mg | ORAL_CAPSULE | Freq: Four times a day (QID) | ORAL | Status: AC

## 2011-06-10 MED ORDER — OXYMETAZOLINE HCL 0.05 % NA SOLN
1.0000 | Freq: Once | NASAL | Status: AC
  Administered 2011-06-10: 1 via NASAL
  Filled 2011-06-10: qty 15

## 2011-06-10 MED ORDER — LORAZEPAM 1 MG PO TABS
0.5000 mg | ORAL_TABLET | Freq: Once | ORAL | Status: AC
  Administered 2011-06-10: 0.5 mg via ORAL
  Filled 2011-06-10: qty 1

## 2011-06-10 MED ORDER — LIDOCAINE-EPINEPHRINE 1 %-1:100000 IJ SOLN
10.0000 mL | Freq: Once | INTRAMUSCULAR | Status: AC
  Administered 2011-06-10: 10 mL
  Filled 2011-06-10: qty 1

## 2011-06-10 NOTE — ED Notes (Signed)
Pt reports nosebleed onset this morning. Pt reports blood noted Left side greater than right side.

## 2011-06-10 NOTE — ED Provider Notes (Addendum)
Pt seen with PA Left epistaxis I was present for placement of nasal tampon Will monitor in ED and reassess Labs are pending  Joya Gaskins, MD 06/10/11 1254  Joya Gaskins, MD 06/12/11 [phone]

## 2011-06-10 NOTE — ED Notes (Signed)
Family at bedside. 

## 2011-06-10 NOTE — ED Notes (Signed)
Call to lab tech for blood draw.

## 2011-06-10 NOTE — Consult Note (Signed)
Reason for Consult:epistaxis Referring Physician: ER  BETHANIE BLOXOM is an 77 y.o. female.  HPI: 77 year old female who takes Plavix for heart disease developed brisk bleeding from the left more than right nasal passage this morning.  Tried to stop bleeding with ice and pressure but could not stop bleeding.  Came to the ER where staff thought there might be an anterior source of bleeding but more likely a posterior source.  A rapid rhino pack was placed but she continued to bleed.  This was removed and another placed with some continued oozing.  Consult was requested.  By the time of my exam, bleeding had stopped.  Past Medical History  Diagnosis Date  . Colon cancer 07/1997  . Hypertension   . Diabetes mellitus     Past Surgical History  Procedure Date  . Hemicolectomy 08/11/1997  . Colon surgery     No family history on file.  Social History:  reports that she has never smoked. She has never used smokeless tobacco. She reports that she drinks alcohol. She reports that she does not use illicit drugs.  Allergies:  Allergies  Allergen Reactions  . Iohexol      Code: VOM, Desc: PT REPORTS VOMITING W/ IVP DYE- ARS 12/26/08, Onset Date: [id number]     Medications: I have reviewed the patient's current medications.  Results for orders placed during the hospital encounter of 06/10/11 (from the past 48 hour(s))  POCT I-STAT, CHEM 8     Status: Abnormal   Collection Time   06/10/11  1:57 PM      Component Value Range Comment   Sodium 141  135 - 145 (mEq/L)    Potassium 4.4  3.5 - 5.1 (mEq/L)    Chloride 104  96 - 112 (mEq/L)    BUN 26 (*) 6 - 23 (mg/dL)    Creatinine, Ser 5.40 (*) 0.50 - 1.10 (mg/dL)    Glucose, Bld 981 (*) 70 - 99 (mg/dL)    Calcium, Ion 1.91  1.12 - 1.32 (mmol/L)    TCO2 29  0 - 100 (mmol/L)    Hemoglobin 12.2  12.0 - 15.0 (g/dL)    HCT 47.8  29.5 - 62.1 (%)     No results found.  Review of Systems  All other systems reviewed and are negative.   Blood  pressure 184/107, pulse 61, temperature 97.5 F (36.4 C), temperature source Oral, resp. rate 18, SpO2 98.00%. Physical Exam  Constitutional: She is oriented to person, place, and time. She appears well-developed and well-nourished. No distress.  HENT:  Head: Normocephalic and atraumatic.  Right Ear: Hearing, tympanic membrane, external ear and ear canal normal.  Left Ear: Hearing, tympanic membrane, external ear and ear canal normal.  Nose: No sinus tenderness or septal deviation. No epistaxis (Rapid rhino pack in left nasal passage.  No active bleeding.).  Mouth/Throat: Uvula is midline, oropharynx is clear and moist and mucous membranes are normal. Abnormal dentition: upper edentulous. No oropharyngeal exudate (clot in posterior pharynx).  Eyes: Conjunctivae and EOM are normal. Pupils are equal, round, and reactive to light.  Neck: Normal range of motion. Neck supple.  Musculoskeletal: Normal range of motion.  Lymphadenopathy:    She has no cervical adenopathy.  Neurological: She is alert and oriented to person, place, and time. No cranial nerve deficit.  Skin: Skin is warm and dry.  Psychiatric: She has a normal mood and affect.    Assessment/Plan: Left epistaxis, possible posterior source. Bleeding has stopped  with rapid rhino pack in place.  I recommended leaving the pack in place for at least 3 days.  Plan follow-up in our office on Wednesday for pack removal.  She was instructed to call our office if bleeding recurs prior to that visit.  I recommended she be treated with Keflex while the pack is in place and asked her to not take Plavix until the pack is removed with no further bleeding.  Kacia Halley D 06/10/2011, 4:06 PM

## 2011-06-10 NOTE — ED Provider Notes (Signed)
History     CSN: [id number] Arrival date & time: 06/10/2011 10:24 AM   None     No chief complaint on file.   (Consider location/radiation/quality/duration/timing/severity/associated sxs/prior treatment) HPI History provided by pt.   Pt developed epistaxis at approx 8:15am today after blowing her nose.  Bleeding has slowed and she is now congested.  Has had nasal congestion recently and PCP prescribed saline nasal spray.  No prior h/o epistaxis.  Denies trauma.  Denies lightheadedness/dizziness/SOB. Pt is on Plavix.    Past Medical History  Diagnosis Date  . Colon cancer 07/1997    Past Surgical History  Procedure Date  . Hemicolectomy 08/11/1997    No family history on file.  History  Substance Use Topics  . Smoking status: Not on file  . Smokeless tobacco: Not on file  . Alcohol Use:     OB History    Grav Para Term Preterm Abortions TAB SAB Ect Mult Living                  Review of Systems  All other systems reviewed and are negative.    Allergies  Iohexol  Home Medications   Current Outpatient Rx  Name Route Sig Dispense Refill  . ALLOPURINOL 100 MG PO TABS Oral Take 100 mg by mouth as needed.      Marland Kitchen AMLODIPINE BESYLATE 10 MG PO TABS Oral Take 10 mg by mouth daily.      . ASPIRIN 81 MG PO TABS Oral Take 81 mg by mouth daily.      . ATORVASTATIN CALCIUM 40 MG PO TABS Oral Take 40 mg by mouth daily.      Marland Kitchen CALCIUM CARBONATE 1250 MG PO TABS Oral Take 1 tablet by mouth daily.      Marland Kitchen CARVEDILOL 12.5 MG PO TABS Oral Take 12.5 mg by mouth 2 (two) times daily with a meal.      . CLONIDINE HCL 0.1 MG PO TABS Oral Take 0.1 mg by mouth daily.      Marland Kitchen CLOPIDOGREL BISULFATE 75 MG PO TABS Oral Take 75 mg by mouth daily.      Marland Kitchen DIGOXIN 0.125 MG PO TABS Oral Take 125 mcg by mouth daily.      Marland Kitchen DILTIAZEM HCL COATED BEADS 360 MG PO CP24 Oral Take 360 mg by mouth daily.      Marland Kitchen DOXYCYCLINE HYCLATE 100 MG PO CAPS Oral Take 100 mg by mouth 2 (two) times daily.      Marland Kitchen  EZETIMIBE 10 MG PO TABS Oral Take 10 mg by mouth daily.      . FUROSEMIDE 20 MG PO TABS Oral Take 20 mg by mouth daily.      Marland Kitchen GLIPIZIDE 10 MG PO TABS Oral Take 10 mg by mouth daily.      Marland Kitchen HYDROCHLOROTHIAZIDE 25 MG PO TABS Oral Take 25 mg by mouth daily.      Marland Kitchen LEVOTHYROXINE SODIUM 50 MCG PO TABS Oral Take 50 mcg by mouth daily.      Marland Kitchen LOSARTAN POTASSIUM 100 MG PO TABS Oral Take 100 mg by mouth daily.      Marland Kitchen METFORMIN HCL 500 MG PO TABS Oral Take 500 mg by mouth 2 (two) times daily with a meal.      . OXYCODONE-ACETAMINOPHEN 7.5-500 MG PO TABS Oral Take 1 tablet by mouth every 4 (four) hours as needed.      Marland Kitchen POTASSIUM CHLORIDE CR 10 MEQ PO TBCR Oral Take 10  mEq by mouth daily.        BP 175/110  Temp(Src) 97.5 F (36.4 C) (Oral)  Resp 18  SpO2 96%  Physical Exam  Nursing note and vitals reviewed. Constitutional: She is oriented to person, place, and time. She appears well-developed and well-nourished. No distress.  HENT:  Head: Normocephalic and atraumatic.       Active epistaxis left nostril. I did not see any active bleeding from kiesselbach's plexus but Dr. Bebe Shaggy believes that he did.  Blood in posterior pharynx as well.    Eyes:       Normal appearance  Neck: Normal range of motion.  Neurological: She is alert and oriented to person, place, and time.  Psychiatric: She has a normal mood and affect. Her behavior is normal.    ED Course  EPISTAXIS MANAGEMENT Date/Time: 06/10/2011 4:27 PM Performed by: Otilio Miu Authorized by: Ruby Cola E Consent: Verbal consent obtained. Risks and benefits: risks, benefits and alternatives were discussed Consent given by: patient Patient understanding: patient states understanding of the procedure being performed Required items: required blood products, implants, devices, and special equipment available Patient identity confirmed: verbally with patient Local anesthetic: LET (lido,epi,tetracaine) Patient  sedated: no Treatment site: left posterior Repair method: anterior pack Post-procedure assessment: bleeding stopped Treatment complexity: complex Patient tolerance: Patient tolerated the procedure well with no immediate complications.   (including critical care time)  Labs Reviewed  POCT I-STAT, CHEM 8 - Abnormal; Notable for the following:    BUN 26 (*)    Creatinine, Ser 1.20 (*)    Glucose, Bld 122 (*)    All other components within normal limits  I-STAT, CHEM 8   No results found.   1. Epistaxis       MDM  Pt anti-coagulated w/ plavix presents w/ what is likely posterior left epistaxis.  Rapid rhino placed but pt continued to ooze blood anteriorly and down back of throat.  Removed and bleeding increased.  Pt became anxious but relaxed w/ 0.5mg  po ativan.  New rapid rhino placed.  later, nose continued to bleed.  Hgb w/in nml range.  Dr. Jenne Pane has agreed to see her in consult.    Dr. Jenne Pane has seen pt and reports that bleeding has stopped.  He will check on her once more before leaving ED but recommends that she d/c Plavix, be started on keflex and f/u with him in 3 days.  Pt discharged home.         Otilio Miu, PA 06/10/11 1808  Otilio Miu, PA 06/26/11 [phone]

## 2011-06-26 NOTE — ED Provider Notes (Signed)
Medical screening examination/treatment/procedure(s) were conducted as a shared visit with non-physician practitioner(s) and myself.  I personally evaluated the patient during the encounter   Joya Gaskins, MD 06/26/11 [phone]

## 2011-06-29 ENCOUNTER — Telehealth: Payer: Self-pay | Admitting: Oncology

## 2011-06-29 NOTE — Telephone Encounter (Signed)
S/w pt, gave appt 08/09/11 @ 9am r/s'd from 11/5 appt cx'd due to Epic.

## 2011-07-03 IMAGING — CR DG CHEST 2V
2 series · 2 of 2 positions shown · non-contrast
Comparison: 02/12/2009

CLINICAL DATA: shortness of breath and elevated D-dimer

CHEST - 2 VIEW

[w chest pa]
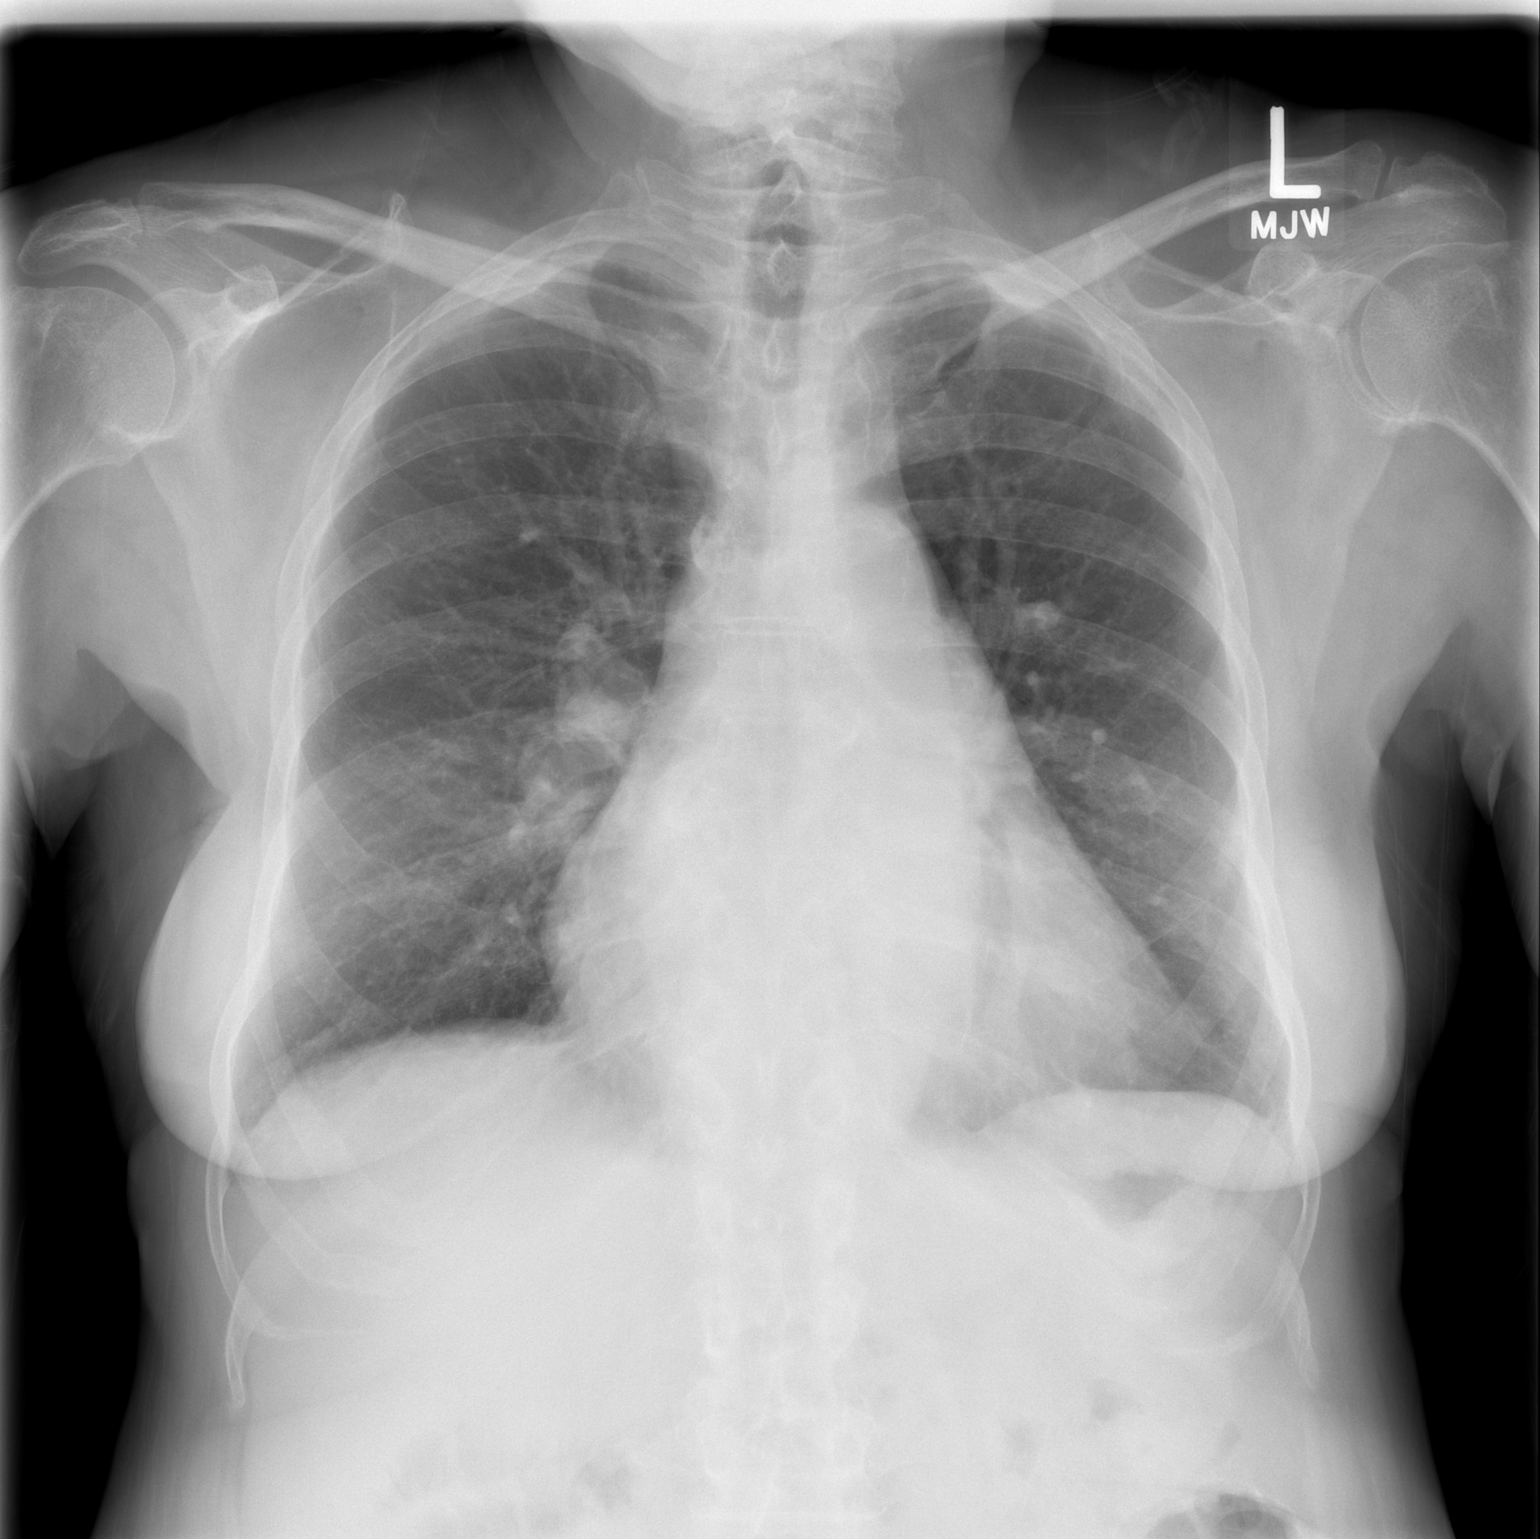

[w chest lat]
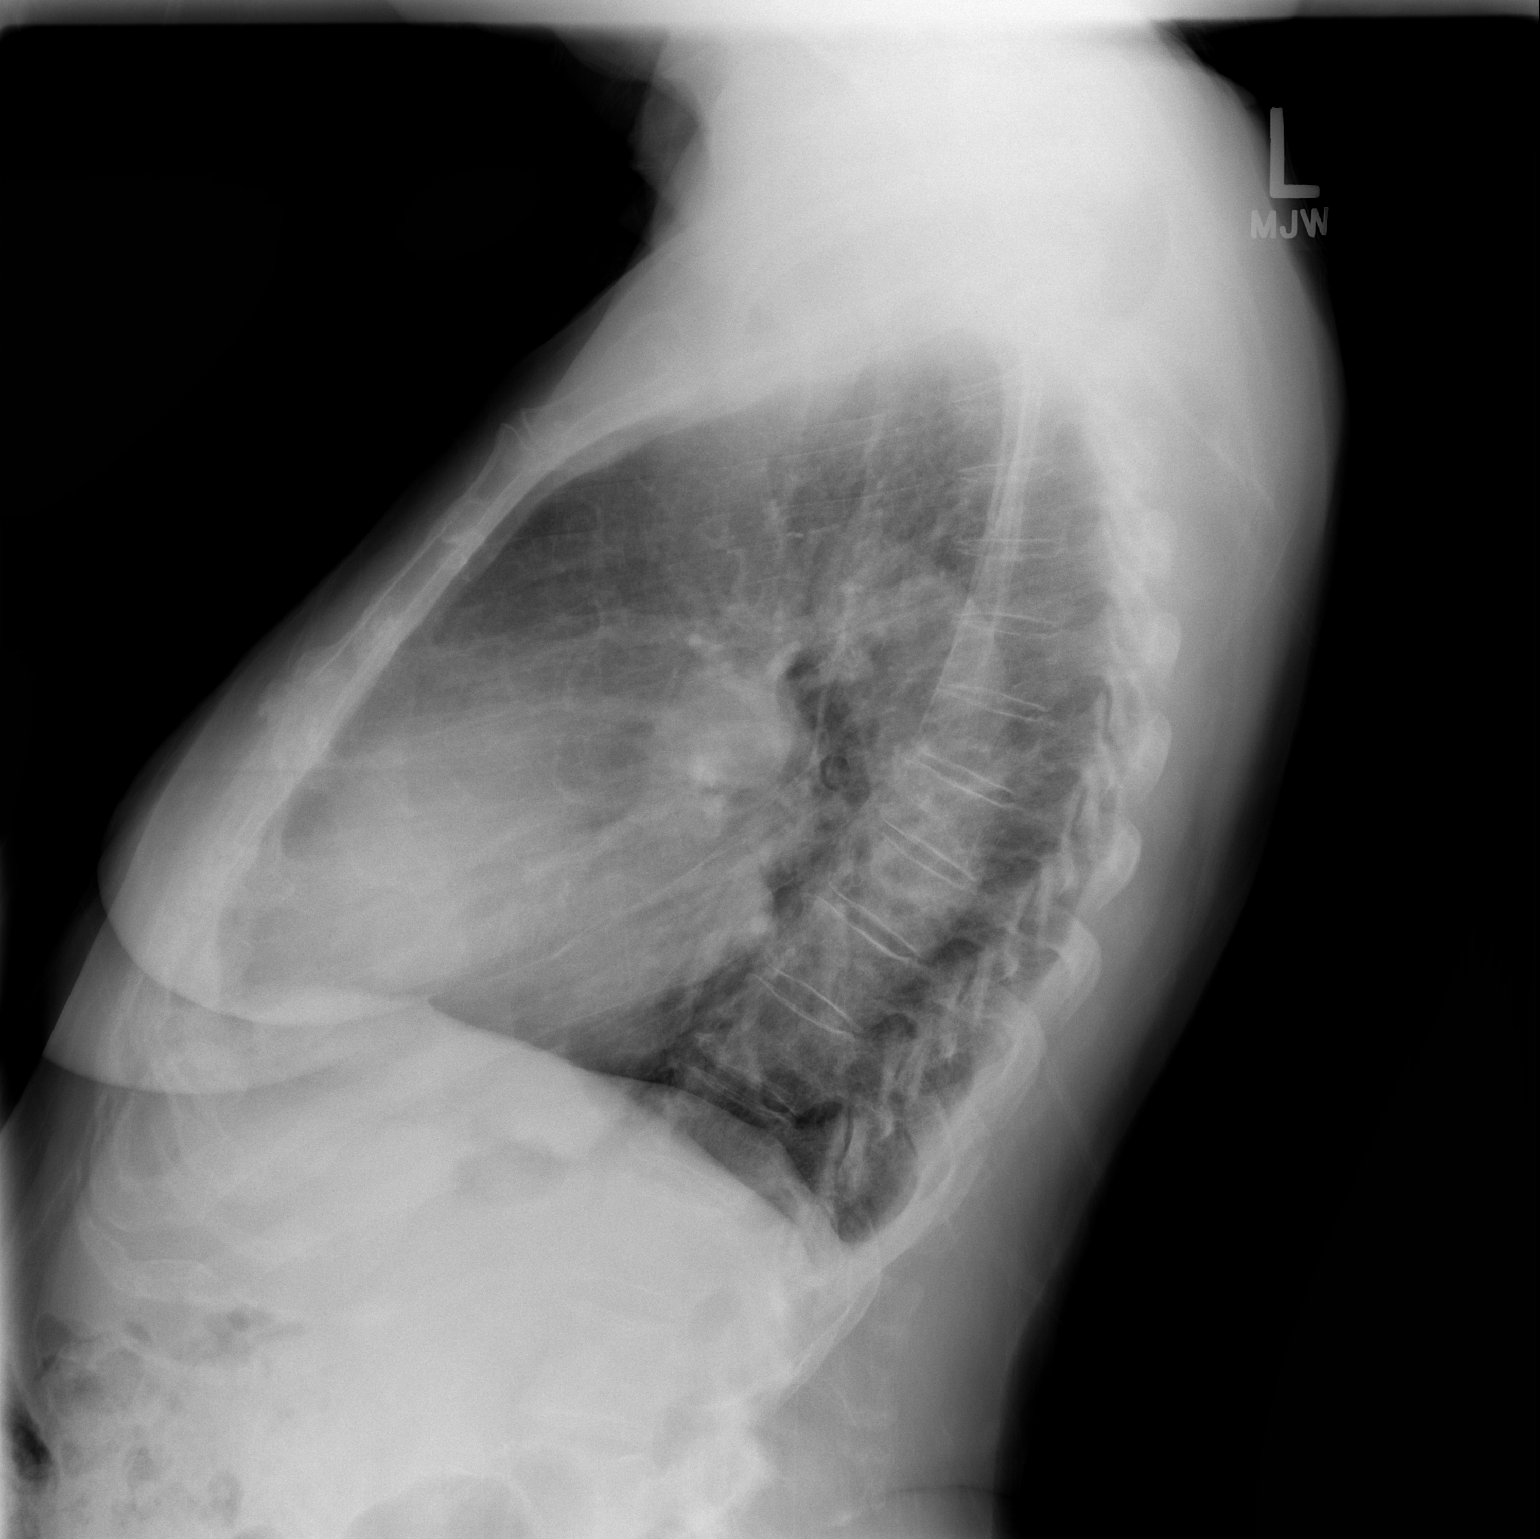

[2 of 2 positions shown; findings below may reference images not displayed]

FINDINGS: Heart size appears normal.

No pleural effusion or pulmonary edema noted.

 No airspace consolidation noted.

The visualized osseous structures are normal.
IMPRESSION: 1.  Cardiac enlargement without evidence for heart failure.

## 2011-07-27 ENCOUNTER — Telehealth: Payer: Self-pay | Admitting: Oncology

## 2011-07-27 NOTE — Telephone Encounter (Signed)
l/m for pt to change time from 9 to 11 and to see sara,letter also mailed   aom

## 2011-08-08 ENCOUNTER — Telehealth: Payer: Self-pay | Admitting: Oncology

## 2011-08-08 NOTE — Telephone Encounter (Signed)
per Olegario Messier move pt up to 10:00/10:30 on saras sch,done and pt is aware  aom

## 2011-08-09 ENCOUNTER — Ambulatory Visit: Payer: Medicare Other | Admitting: Oncology

## 2011-08-09 ENCOUNTER — Other Ambulatory Visit: Payer: Medicare Other | Admitting: Lab

## 2011-08-09 ENCOUNTER — Other Ambulatory Visit (HOSPITAL_BASED_OUTPATIENT_CLINIC_OR_DEPARTMENT_OTHER): Payer: Medicare Other | Admitting: Lab

## 2011-08-09 ENCOUNTER — Ambulatory Visit: Payer: Medicare Other | Admitting: Physician Assistant

## 2011-08-09 ENCOUNTER — Ambulatory Visit (HOSPITAL_BASED_OUTPATIENT_CLINIC_OR_DEPARTMENT_OTHER): Payer: Medicare Other | Admitting: Physician Assistant

## 2011-08-09 ENCOUNTER — Other Ambulatory Visit: Payer: Self-pay | Admitting: Oncology

## 2011-08-09 VITALS — BP 154/87 | HR 54 | Temp 98.3°F | Ht 68.0 in | Wt 179.8 lb

## 2011-08-09 DIAGNOSIS — C186 Malignant neoplasm of descending colon: Secondary | ICD-10-CM

## 2011-08-09 DIAGNOSIS — D649 Anemia, unspecified: Secondary | ICD-10-CM | POA: Insufficient documentation

## 2011-08-09 DIAGNOSIS — Z85038 Personal history of other malignant neoplasm of large intestine: Secondary | ICD-10-CM | POA: Insufficient documentation

## 2011-08-09 DIAGNOSIS — C772 Secondary and unspecified malignant neoplasm of intra-abdominal lymph nodes: Secondary | ICD-10-CM

## 2011-08-09 DIAGNOSIS — C189 Malignant neoplasm of colon, unspecified: Secondary | ICD-10-CM

## 2011-08-09 LAB — CBC WITH DIFFERENTIAL/PLATELET
BASO%: 0.6 % (ref 0.0–2.0)
Basophils Absolute: 0 10*3/uL (ref 0.0–0.1)
HCT: 35 % (ref 34.8–46.6)
HGB: 11.5 g/dL — ABNORMAL LOW (ref 11.6–15.9)
MCH: 27.4 pg (ref 25.1–34.0)
MONO#: 0.8 10*3/uL (ref 0.1–0.9)
MONO%: 11.1 % (ref 0.0–14.0)
Platelets: 352 10*3/uL (ref 145–400)
lymph#: 1.2 10*3/uL (ref 0.9–3.3)

## 2011-08-09 LAB — IRON AND TIBC
%SAT: 9 % — ABNORMAL LOW (ref 20–55)
Iron: 31 ug/dL — ABNORMAL LOW (ref 42–145)

## 2011-08-09 LAB — FERRITIN: Ferritin: 15 ng/mL (ref 10–291)

## 2011-08-09 NOTE — Progress Notes (Signed)
[facility] OFFICE PROGRESS NOTE [facility]  Dr. Providence Crosby  Dr. Julieanne Manson  Dr. Bernette Redbird  INTERIM HISTORY:  Nicole [name] returned to clinic for followup of her stage III adenocarcinoma of the descending colon, T3, N1, M0.  The patient was last evaluated in our clinic on 2011, and was scheduled for repeat visit in one year.  However, she failed to keep this appointment.  She is here today for followup.  She reports most recently she was hospitalized between 06/10/11 as she developed Left epistaxis, possible posterior source requiring  with rapid rhino pack with resolution of bleeding immediately and no recurrence.  The patient states that she last followed up with her cardiologist, Dr. Clarene Duke for her A fib. Because of her recent bleeding episode while on Plavix, patient is now on ASA only.   No fevers, chills, or night sweats.  No ongoing dyspnea or cough.  She reports decreased appetite due to stress after the loss of her son last year, and other issues with the living son. She has had no issues with nausea, vomiting, constipation, or diarrhea.  No abdominal pain or cramping or rectal bleeding.  Of note, patient is due for a colonoscopy in March of this year by Dr. Matthias Hughs,  No dysuria, no frequency or hematuria.  No alteration in sensation or balance or swelling of extremities. Pt is up to date with her primary care visits for all other issues.     MEDICAL HISTORY: Past Medical History  Diagnosis Date  . Colon cancer 07/1997  . Hypertension   . Diabetes mellitus     SURGICAL HISTORY:  Past Surgical History  Procedure Date  . Hemicolectomy 08/11/1997  . Colon surgery     MEDICATIONS: Current Outpatient Prescriptions  Medication Sig Dispense Refill  . allopurinol (ZYLOPRIM) 100 MG tablet Take 100 mg by mouth as needed. For gout      . amLODipine (NORVASC) 10 MG tablet Take 10 mg by mouth daily.        Marland Kitchen aspirin 81 MG tablet Take 81 mg by  mouth daily.        Marland Kitchen atorvastatin (LIPITOR) 40 MG tablet Take 40 mg by mouth daily.        . calcium carbonate (OS-CAL - DOSED IN MG OF ELEMENTAL CALCIUM) 1250 MG tablet Take 1 tablet by mouth daily.        . carvedilol (COREG) 12.5 MG tablet Take 12.5 mg by mouth 2 (two) times daily with a meal.        . cloNIDine (CATAPRES) 0.1 MG tablet Take 0.1 mg by mouth daily.        . clopidogrel (PLAVIX) 75 MG tablet Take 75 mg by mouth daily.        . digoxin (LANOXIN) 0.125 MG tablet Take 125 mcg by mouth daily.        Marland Kitchen diltiazem (CARDIZEM CD) 360 MG 24 hr capsule Take 360 mg by mouth daily.        Marland Kitchen doxycycline (VIBRAMYCIN) 100 MG capsule Take 100 mg by mouth 2 (two) times daily.        Marland Kitchen ezetimibe (ZETIA) 10 MG tablet Take 10 mg by mouth daily.        . furosemide (LASIX) 20 MG tablet Take 20 mg by mouth daily.        Marland Kitchen glipiZIDE (GLUCOTROL) 10 MG tablet Take 10 mg by mouth daily.        . hydrochlorothiazide (  HYDRODIURIL) 25 MG tablet Take 25 mg by mouth daily.        Marland Kitchen levothyroxine (SYNTHROID, LEVOTHROID) 50 MCG tablet Take 50 mcg by mouth daily.        Marland Kitchen losartan (COZAAR) 100 MG tablet Take 100 mg by mouth daily.        . metFORMIN (GLUCOPHAGE) 500 MG tablet Take 500 mg by mouth 2 (two) times daily with a meal.        . oxyCODONE-acetaminophen (PERCOCET) 7.5-500 MG per tablet Take 1 tablet by mouth every 4 (four) hours as needed. For pain      . potassium chloride (K-DUR) 10 MEQ tablet Take 10 mEq by mouth daily.          ALLERGIES:  is allergic to iohexol.  REVIEW OF SYSTEMS:  The rest of the 14-point review of system was negative.   There were no vitals filed for this visit. Wt Readings from Last 3 Encounters:  05/26/10 178 lb 11.2 oz (81.058 kg)       PHYSICAL EXAMINATION:   General:  This is a well-developed, well-nourished black female in no acute distress.  HEENT:  Sclerae anicteric.  There is no oral thrush or mucositis.  Skin:  No rashes or lesions.  Lymph:  No peripheral  lymphadenopathy.  Cardiac:  Irregular rate and rhythm without murmurs or gallops.  Peripheral pulses are palpable.  Chest:  Lungs clear to auscultation.  Abdomen:  Positive bowel sounds.  Soft, nontender, nondistended.  No organomegaly.  Extremities:  No edema or cyanosis.  Neurologic:  Alert and oriented x 3.  Strength, sensation, and coordination all grossly intact.      LABORATORY/RADIOLOGY DATA:   Lab 08/09/11 1012  WBC 7.2  HGB 11.5*  HCT 35.0  PLT 352  MCV 83.3  MCH 27.4  MCHC 32.9  RDW 18.0*  LYMPHSABS 1.2  MONOABS 0.8  EOSABS 0.1  BASOSABS 0.0  BANDABS --    CMP   No results found for this basename: NA:5,K:5,CL:5,CO2:5,GLUCOSE:5,BUN:5,CREATININE:5,GFRCGP,:5,CALCIUM:5,MG:5,AST:5,ALT:5,ALKPHOS:5,BILITOT:5 in the last 168 hours      Component Value Date/Time   BILITOT 0.6 05/26/2010 1317     Radiology Studies:  No results found.     ASSESSMENT AND PLAN:    1. [name] is a 78 year old black female with stage III adenocarcinoma of the descending colon, T3, N1, M0 with 2 out of 7 positive lymph nodes.  She underwent left hemicolectomy on 08/11/1997, as well as incidental splenectomy.  She received 6 cycles of adjuvant chemotherapy with 5-FU, leucovorin, and has had no evidence of disease recurrence since that time.  2. The patient's last colonoscopy was 10/13/2008.  Surgical pathology of cecum polyp revealed tubular adenoma.  No high grade dysplasia or malignancy identified.  Colon ascending polyp revealed 2 adenomatous polyps and fragments of hyperplastic polyp.  Pt is due for another colonoscopy in March of this year.   3. Anemia: Last Iron studies available from 2009 show TIBC of 23 percent and  IBC of 98. Will recheck Iron studies today and will await for the results to proceed with further orders.   4 The patient remains under the care of Dr. Julieanne Manson for atrial fibrillation.  5 Per Dr. Arline Asp, the patient was scheduled for followup visit in  one year, at which time we will reassess CBC with diff, CMET, LDH, and CEA.  The patient is instructed to call in the interim if any questions or problems.

## 2011-08-09 NOTE — Progress Notes (Signed)
Addended by: Nancy Nordmann on: 08/09/2011 11:54 AM   Modules accepted: Orders

## 2011-08-10 ENCOUNTER — Telehealth: Payer: Self-pay | Admitting: Medical Oncology

## 2011-08-10 ENCOUNTER — Encounter: Payer: Self-pay | Admitting: Medical Oncology

## 2011-08-10 LAB — COMPREHENSIVE METABOLIC PANEL
ALT: 9 U/L (ref 0–35)
BUN: 19 mg/dL (ref 6–23)
CO2: 26 mEq/L (ref 19–32)
Calcium: 9.3 mg/dL (ref 8.4–10.5)
Creatinine, Ser: 1.32 mg/dL — ABNORMAL HIGH (ref 0.50–1.10)
Glucose, Bld: 113 mg/dL — ABNORMAL HIGH (ref 70–99)
Total Bilirubin: 0.5 mg/dL (ref 0.3–1.2)

## 2011-08-10 LAB — LACTATE DEHYDROGENASE: LDH: 142 U/L (ref 94–250)

## 2011-08-10 LAB — CEA: CEA: 3 ng/mL (ref 0.0–5.0)

## 2011-08-10 NOTE — Progress Notes (Signed)
This is an orders only encounter.  Pt should not incur an additional charge.  She was seen earlier by PA.

## 2011-08-10 NOTE — Telephone Encounter (Signed)
Tried to reach pt regarding her Iron studies. Per Dr. Arline Asp would like for her to take Iron 325mg  BID. Will try to reach her later today.

## 2011-08-10 NOTE — Progress Notes (Signed)
Per Dr.Murinson' request labs results from 08/09/11  faxed to Dr. Matthias Hughs

## 2011-08-10 NOTE — Telephone Encounter (Signed)
I spoke with pt to let her know that her ferritin has dropped from 98 to 15. Per Dr Arline Asp he would like for her to start taking Iron 325 mg twice a day. I explained she will need to take with food and to watch for constipation. She voiced understanding. She does state she has an appointment to see Dr. Matthias Hughs in the next week or two.

## 2011-08-13 ENCOUNTER — Telehealth: Payer: Self-pay | Admitting: Oncology

## 2011-08-13 NOTE — Telephone Encounter (Signed)
pt was called with 2014 yrly appt,appt 1/23 at 10:30,printed and mailed to pt aom

## 2011-08-29 ENCOUNTER — Other Ambulatory Visit: Payer: Self-pay | Admitting: Cardiology

## 2011-08-29 DIAGNOSIS — IMO0001 Reserved for inherently not codable concepts without codable children: Secondary | ICD-10-CM

## 2011-09-19 ENCOUNTER — Other Ambulatory Visit: Payer: Self-pay | Admitting: Gastroenterology

## 2011-10-23 IMAGING — CR DG CHEST 1V PORT
1 series · 1 of 1 positions shown · non-contrast
Comparison: Chest x-ray 08/12/2009

CLINICAL DATA: Chest pain and shortness of breath.

PORTABLE CHEST - 1 VIEW

[view not recorded]
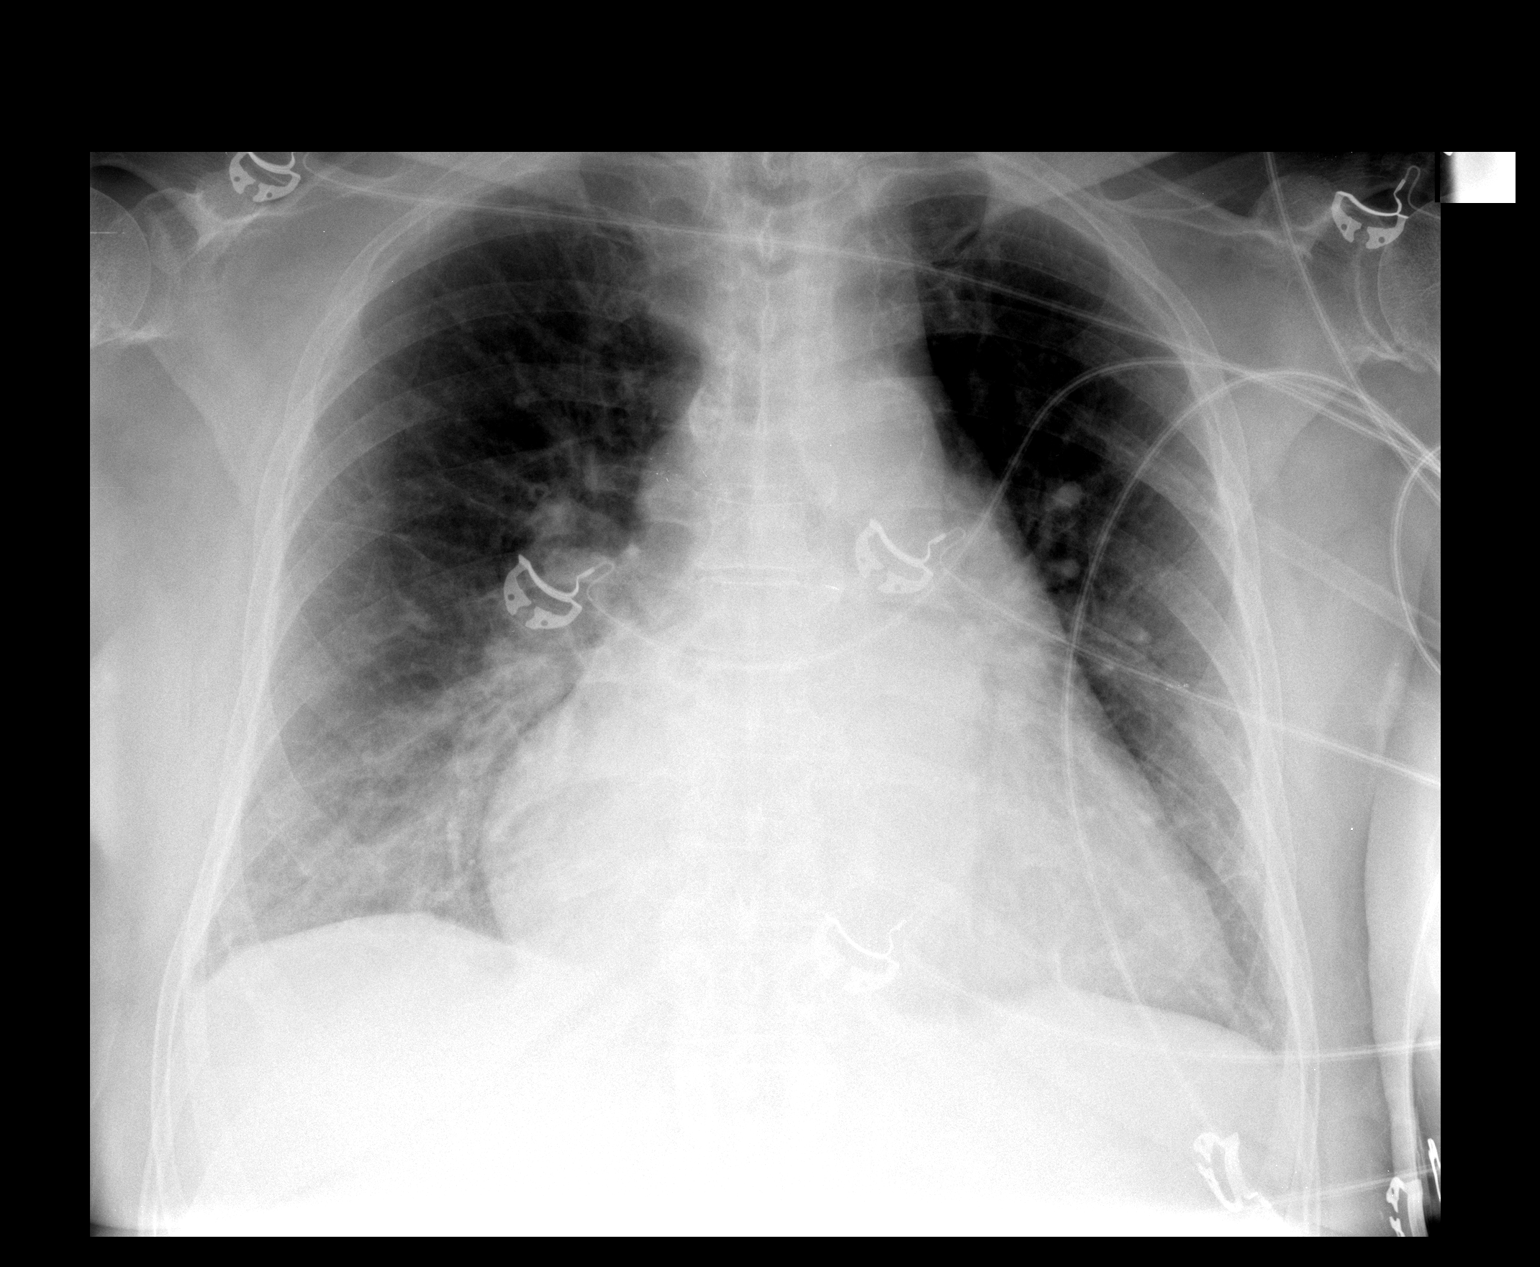

[1 of 1 positions shown; findings below may reference images not displayed]

FINDINGS: The heart is enlarged but stable.  There are mild chronic
bronchitic type lung changes.    No infiltrates, edema or
effusions.  The bony thorax is intact.
IMPRESSION: Stable cardiac enlargement and chronic bronchitic type lung
changes.

## 2012-02-01 IMAGING — CR DG KNEE COMPLETE 4+V*R*
4 series · 4 of 4 positions shown · non-contrast
Comparison: None.

CLINICAL DATA: Fall, pain.

RIGHT KNEE - COMPLETE 4+ VIEW

[t knee ap right]
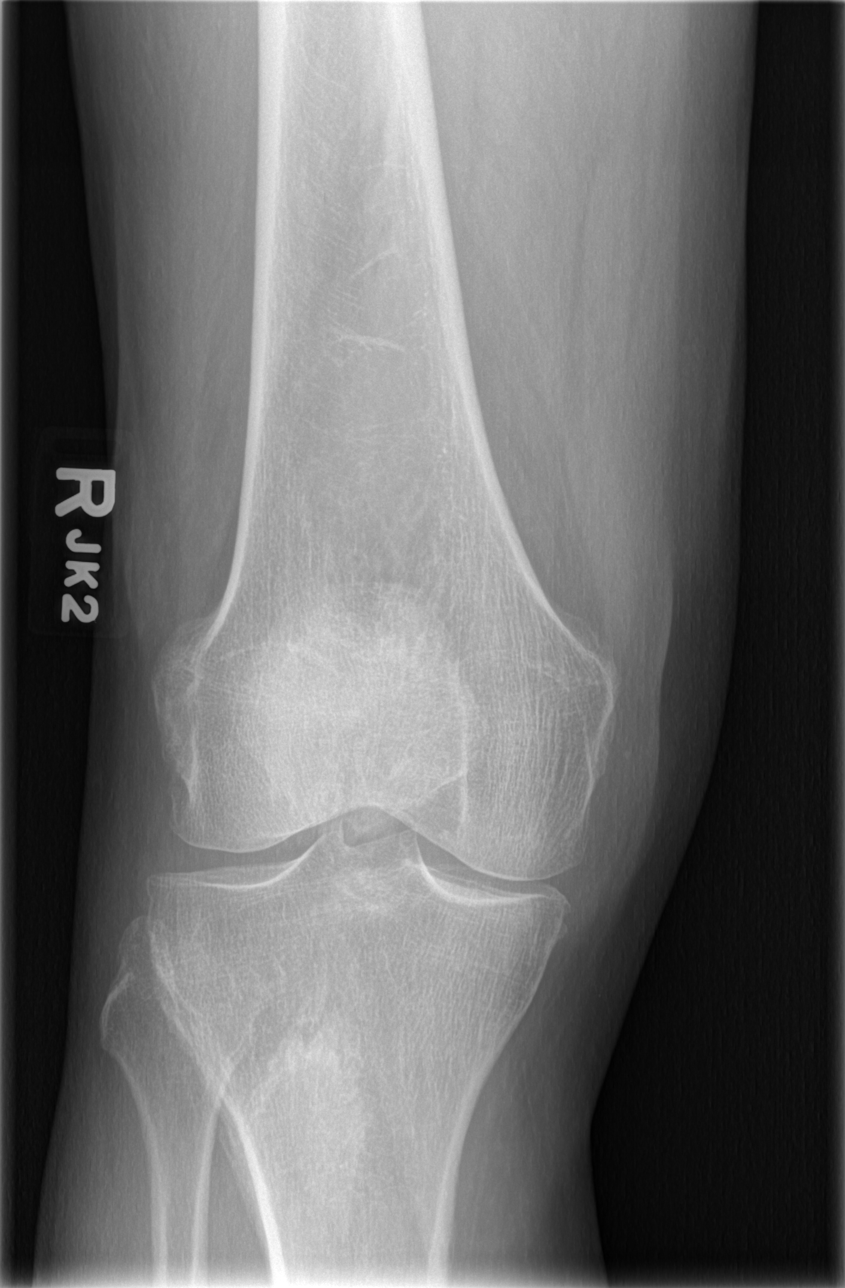

[t knee oblique right (1 of 2)]
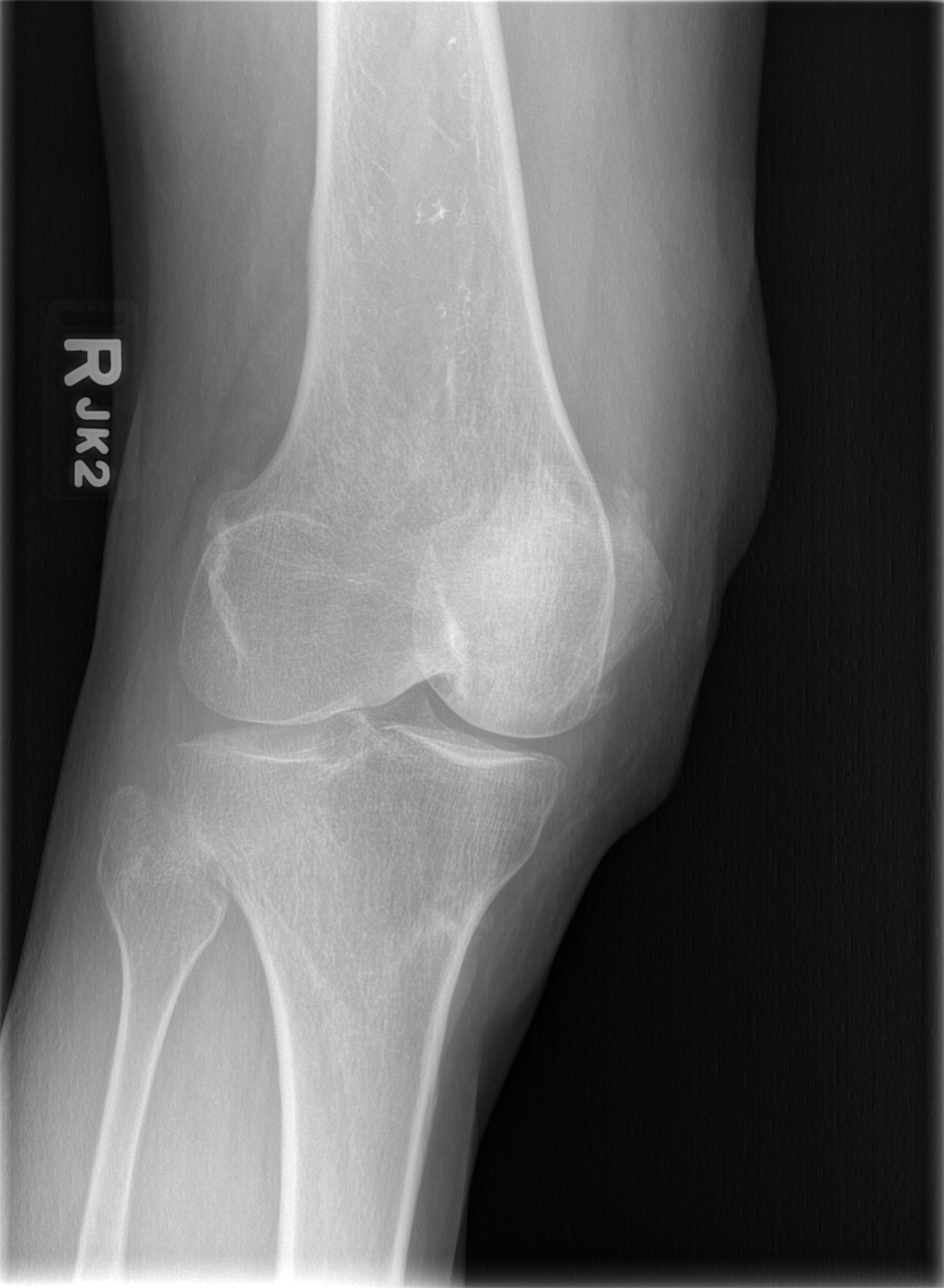

[t knee oblique right (2 of 2)]
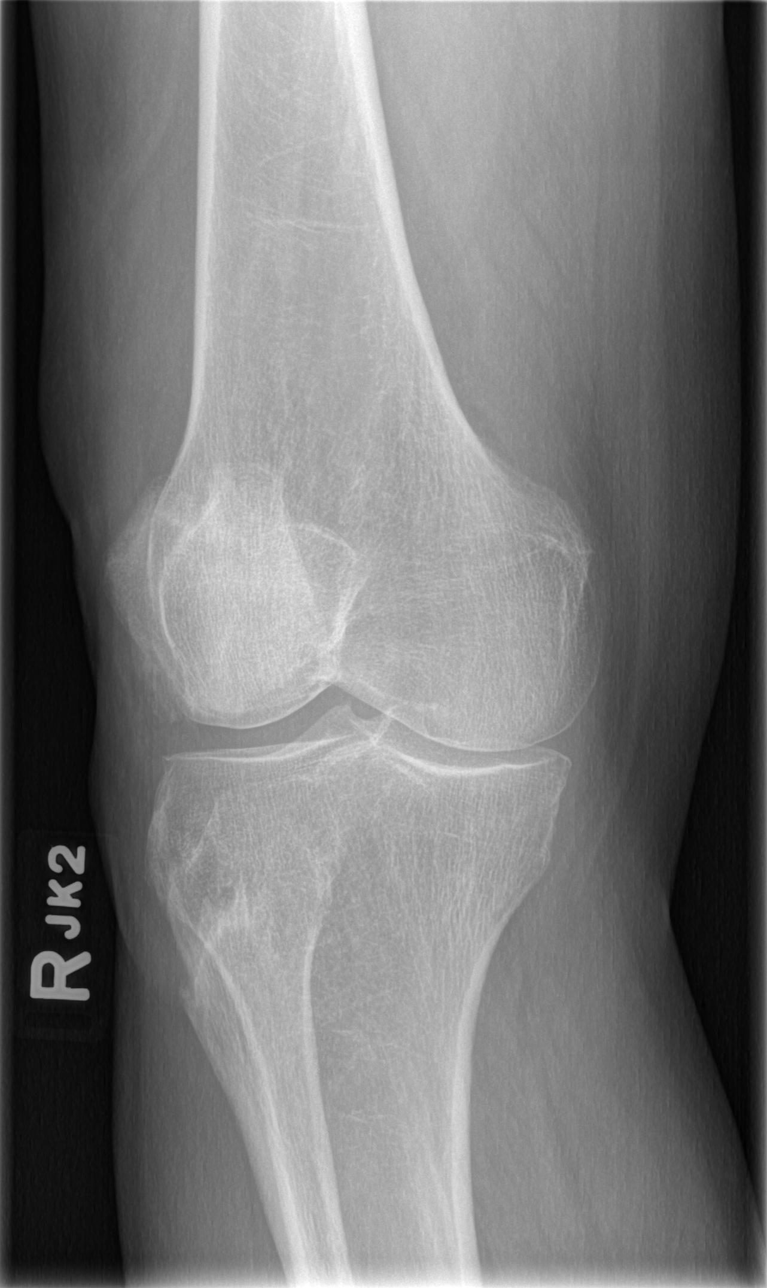

[t knee lat right]
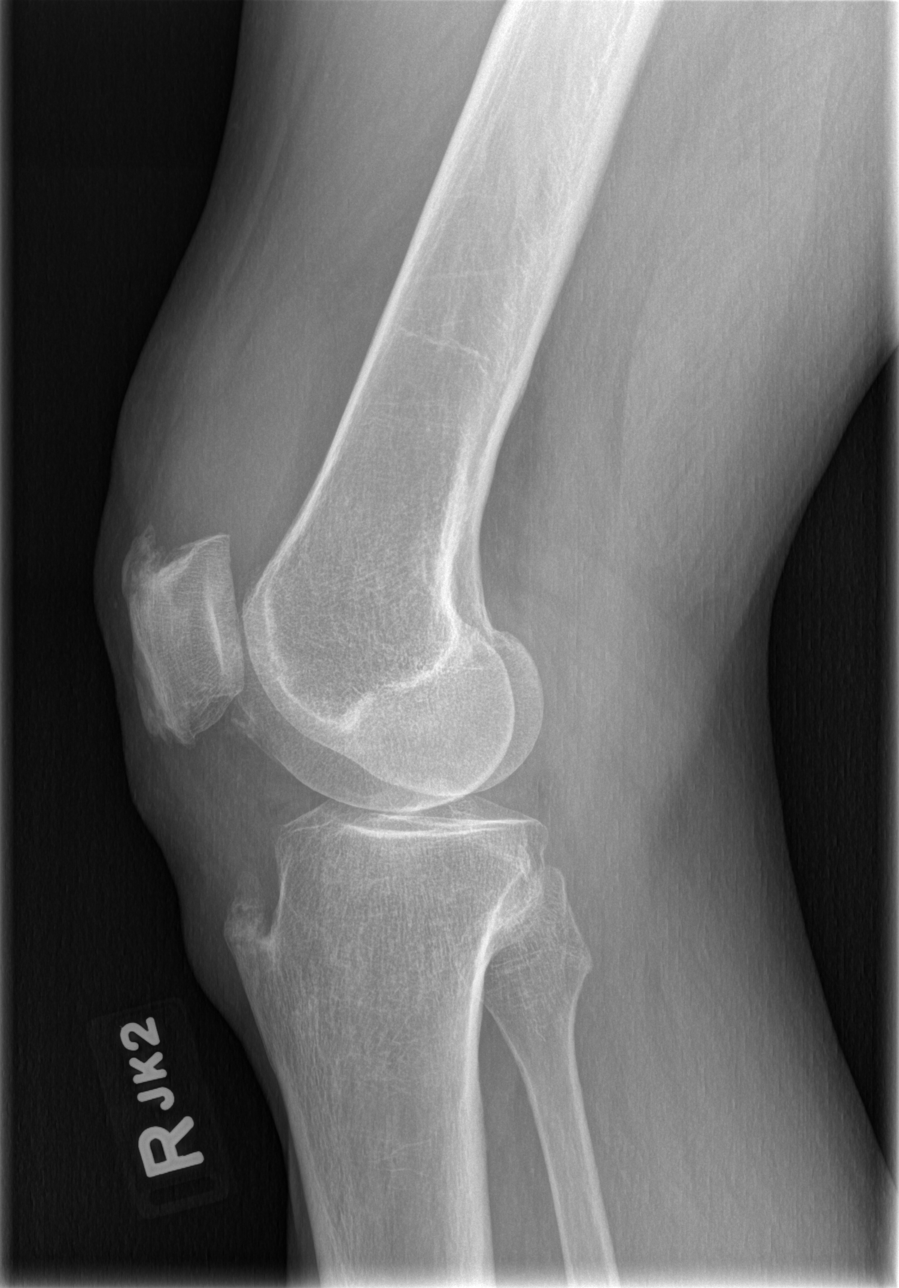

[4 of 4 positions shown; findings below may reference images not displayed]

FINDINGS: No fracture is identified.  The patient has a moderate
joint effusion.  There is degenerative change about the knee with
joint space loss and osteophytosis most prominent about the medial
compartment.
IMPRESSION: 1.  No fracture.
2.  Moderate joint effusion.
3.  Degenerative disease.

## 2012-05-11 ENCOUNTER — Emergency Department (HOSPITAL_COMMUNITY)
Admission: EM | Admit: 2012-05-11 | Discharge: 2012-05-12 | Disposition: A | Discharge status: Home / Self Care | Payer: Medicare Other | Source: Home / Self Care | Attending: Emergency Medicine | Admitting: Emergency Medicine

## 2012-05-11 ENCOUNTER — Encounter (HOSPITAL_COMMUNITY): Payer: Self-pay | Admitting: Emergency Medicine

## 2012-05-11 DIAGNOSIS — R10811 Right upper quadrant abdominal tenderness: Secondary | ICD-10-CM | POA: Insufficient documentation

## 2012-05-11 DIAGNOSIS — E119 Type 2 diabetes mellitus without complications: Secondary | ICD-10-CM | POA: Insufficient documentation

## 2012-05-11 DIAGNOSIS — K8020 Calculus of gallbladder without cholecystitis without obstruction: Secondary | ICD-10-CM

## 2012-05-11 DIAGNOSIS — Z79899 Other long term (current) drug therapy: Secondary | ICD-10-CM | POA: Insufficient documentation

## 2012-05-11 DIAGNOSIS — R109 Unspecified abdominal pain: Secondary | ICD-10-CM | POA: Insufficient documentation

## 2012-05-11 DIAGNOSIS — I10 Essential (primary) hypertension: Secondary | ICD-10-CM | POA: Insufficient documentation

## 2012-05-11 HISTORY — DX: Calculus of kidney: N20.0

## 2012-05-11 HISTORY — DX: Unspecified atrial fibrillation: I48.91

## 2012-05-11 HISTORY — DX: Pure hypercholesterolemia, unspecified: E78.00

## 2012-05-11 HISTORY — DX: Abdominal aortic aneurysm, without rupture: I71.4

## 2012-05-11 HISTORY — DX: Abdominal aortic aneurysm, without rupture, unspecified: I71.40

## 2012-05-11 NOTE — ED Notes (Addendum)
Pt reports eating -chicken liver, onions, and salad- at 3:30, and every since then has been having upper abd pain; does report having n/v- X 2; reports having a little CP and SOB; pt has cardiac hx and hx of AAA

## 2012-05-12 ENCOUNTER — Encounter (HOSPITAL_COMMUNITY): Payer: Self-pay | Admitting: *Deleted

## 2012-05-12 ENCOUNTER — Inpatient Hospital Stay (HOSPITAL_COMMUNITY)
Admission: EM | Admit: 2012-05-12 | Discharge: 2012-06-02 | DRG: 417 | Disposition: A | Discharge status: Home Health | Payer: Medicare Other | Attending: Internal Medicine | Admitting: Internal Medicine

## 2012-05-12 ENCOUNTER — Emergency Department (HOSPITAL_COMMUNITY): Payer: Medicare Other

## 2012-05-12 DIAGNOSIS — E1149 Type 2 diabetes mellitus with other diabetic neurological complication: Secondary | ICD-10-CM | POA: Diagnosis present

## 2012-05-12 DIAGNOSIS — R0902 Hypoxemia: Secondary | ICD-10-CM | POA: Diagnosis not present

## 2012-05-12 DIAGNOSIS — D72829 Elevated white blood cell count, unspecified: Secondary | ICD-10-CM

## 2012-05-12 DIAGNOSIS — I951 Orthostatic hypotension: Secondary | ICD-10-CM | POA: Diagnosis not present

## 2012-05-12 DIAGNOSIS — I714 Abdominal aortic aneurysm, without rupture: Secondary | ICD-10-CM

## 2012-05-12 DIAGNOSIS — E039 Hypothyroidism, unspecified: Secondary | ICD-10-CM | POA: Diagnosis present

## 2012-05-12 DIAGNOSIS — E1165 Type 2 diabetes mellitus with hyperglycemia: Secondary | ICD-10-CM | POA: Diagnosis not present

## 2012-05-12 DIAGNOSIS — Y9210 Unspecified residential institution as the place of occurrence of the external cause: Secondary | ICD-10-CM | POA: Diagnosis present

## 2012-05-12 DIAGNOSIS — E876 Hypokalemia: Secondary | ICD-10-CM

## 2012-05-12 DIAGNOSIS — I10 Essential (primary) hypertension: Secondary | ICD-10-CM

## 2012-05-12 DIAGNOSIS — I428 Other cardiomyopathies: Secondary | ICD-10-CM | POA: Diagnosis present

## 2012-05-12 DIAGNOSIS — IMO0001 Reserved for inherently not codable concepts without codable children: Secondary | ICD-10-CM

## 2012-05-12 DIAGNOSIS — E860 Dehydration: Secondary | ICD-10-CM | POA: Insufficient documentation

## 2012-05-12 DIAGNOSIS — D649 Anemia, unspecified: Secondary | ICD-10-CM

## 2012-05-12 DIAGNOSIS — E1142 Type 2 diabetes mellitus with diabetic polyneuropathy: Secondary | ICD-10-CM | POA: Diagnosis present

## 2012-05-12 DIAGNOSIS — I482 Chronic atrial fibrillation, unspecified: Secondary | ICD-10-CM

## 2012-05-12 DIAGNOSIS — Z85038 Personal history of other malignant neoplasm of large intestine: Secondary | ICD-10-CM | POA: Diagnosis present

## 2012-05-12 DIAGNOSIS — K567 Ileus, unspecified: Secondary | ICD-10-CM | POA: Diagnosis not present

## 2012-05-12 DIAGNOSIS — J9600 Acute respiratory failure, unspecified whether with hypoxia or hypercapnia: Secondary | ICD-10-CM | POA: Diagnosis not present

## 2012-05-12 DIAGNOSIS — R112 Nausea with vomiting, unspecified: Secondary | ICD-10-CM | POA: Insufficient documentation

## 2012-05-12 DIAGNOSIS — K929 Disease of digestive system, unspecified: Secondary | ICD-10-CM | POA: Diagnosis not present

## 2012-05-12 DIAGNOSIS — J9819 Other pulmonary collapse: Secondary | ICD-10-CM | POA: Diagnosis not present

## 2012-05-12 DIAGNOSIS — R0603 Acute respiratory distress: Secondary | ICD-10-CM | POA: Diagnosis not present

## 2012-05-12 DIAGNOSIS — I712 Thoracic aortic aneurysm, without rupture, unspecified: Secondary | ICD-10-CM | POA: Diagnosis present

## 2012-05-12 DIAGNOSIS — K812 Acute cholecystitis with chronic cholecystitis: Secondary | ICD-10-CM | POA: Diagnosis present

## 2012-05-12 DIAGNOSIS — E870 Hyperosmolality and hypernatremia: Secondary | ICD-10-CM | POA: Diagnosis not present

## 2012-05-12 DIAGNOSIS — N289 Disorder of kidney and ureter, unspecified: Secondary | ICD-10-CM | POA: Diagnosis present

## 2012-05-12 DIAGNOSIS — K8020 Calculus of gallbladder without cholecystitis without obstruction: Secondary | ICD-10-CM

## 2012-05-12 DIAGNOSIS — R52 Pain, unspecified: Secondary | ICD-10-CM

## 2012-05-12 DIAGNOSIS — I5042 Chronic combined systolic (congestive) and diastolic (congestive) heart failure: Secondary | ICD-10-CM | POA: Diagnosis present

## 2012-05-12 DIAGNOSIS — E119 Type 2 diabetes mellitus without complications: Secondary | ICD-10-CM

## 2012-05-12 DIAGNOSIS — D638 Anemia in other chronic diseases classified elsewhere: Secondary | ICD-10-CM | POA: Diagnosis present

## 2012-05-12 DIAGNOSIS — N189 Chronic kidney disease, unspecified: Secondary | ICD-10-CM | POA: Diagnosis not present

## 2012-05-12 DIAGNOSIS — K8000 Calculus of gallbladder with acute cholecystitis without obstruction: Principal | ICD-10-CM | POA: Diagnosis present

## 2012-05-12 DIAGNOSIS — K560 Paralytic ileus: Secondary | ICD-10-CM | POA: Diagnosis not present

## 2012-05-12 DIAGNOSIS — M109 Gout, unspecified: Secondary | ICD-10-CM | POA: Diagnosis present

## 2012-05-12 DIAGNOSIS — K565 Intestinal adhesions [bands], unspecified as to partial versus complete obstruction: Secondary | ICD-10-CM

## 2012-05-12 DIAGNOSIS — K653 Choleperitonitis: Secondary | ICD-10-CM | POA: Diagnosis not present

## 2012-05-12 DIAGNOSIS — R1011 Right upper quadrant pain: Secondary | ICD-10-CM | POA: Insufficient documentation

## 2012-05-12 DIAGNOSIS — I4891 Unspecified atrial fibrillation: Secondary | ICD-10-CM | POA: Diagnosis present

## 2012-05-12 DIAGNOSIS — N179 Acute kidney failure, unspecified: Secondary | ICD-10-CM | POA: Diagnosis not present

## 2012-05-12 DIAGNOSIS — Z79899 Other long term (current) drug therapy: Secondary | ICD-10-CM

## 2012-05-12 DIAGNOSIS — I129 Hypertensive chronic kidney disease with stage 1 through stage 4 chronic kidney disease, or unspecified chronic kidney disease: Secondary | ICD-10-CM | POA: Diagnosis present

## 2012-05-12 DIAGNOSIS — K838 Other specified diseases of biliary tract: Secondary | ICD-10-CM | POA: Diagnosis present

## 2012-05-12 DIAGNOSIS — K8010 Calculus of gallbladder with chronic cholecystitis without obstruction: Secondary | ICD-10-CM | POA: Diagnosis present

## 2012-05-12 DIAGNOSIS — D62 Acute posthemorrhagic anemia: Secondary | ICD-10-CM | POA: Diagnosis not present

## 2012-05-12 DIAGNOSIS — I509 Heart failure, unspecified: Secondary | ICD-10-CM | POA: Diagnosis present

## 2012-05-12 DIAGNOSIS — IMO0002 Reserved for concepts with insufficient information to code with codable children: Secondary | ICD-10-CM

## 2012-05-12 DIAGNOSIS — R1110 Vomiting, unspecified: Secondary | ICD-10-CM

## 2012-05-12 DIAGNOSIS — Y836 Removal of other organ (partial) (total) as the cause of abnormal reaction of the patient, or of later complication, without mention of misadventure at the time of the procedure: Secondary | ICD-10-CM | POA: Diagnosis present

## 2012-05-12 HISTORY — DX: Intestinal adhesions (bands), unspecified as to partial versus complete obstruction: K56.50

## 2012-05-12 LAB — COMPREHENSIVE METABOLIC PANEL
ALT: 9 U/L (ref 0–35)
Alkaline Phosphatase: 95 U/L (ref 39–117)
BUN: 19 mg/dL (ref 6–23)
CO2: 31 mEq/L (ref 19–32)
Calcium: 10.3 mg/dL (ref 8.4–10.5)
Chloride: 100 mEq/L (ref 96–112)
Creatinine, Ser: 1.23 mg/dL — ABNORMAL HIGH (ref 0.50–1.10)
GFR calc Af Amer: 50 mL/min — ABNORMAL LOW (ref >90)
GFR calc non Af Amer: 41 mL/min — ABNORMAL LOW (ref >90)
Glucose, Bld: 187 mg/dL — ABNORMAL HIGH (ref 70–99)
Glucose, Bld: 187 mg/dL — ABNORMAL HIGH (ref 70–99)
Potassium: 3.6 mEq/L (ref 3.5–5.1)
Sodium: 141 mEq/L (ref 135–145)
Total Bilirubin: 0.5 mg/dL (ref 0.3–1.2)
Total Protein: 7.9 g/dL (ref 6.0–8.3)
Total Protein: 8 g/dL (ref 6.0–8.3)

## 2012-05-12 LAB — URINALYSIS, ROUTINE W REFLEX MICROSCOPIC
Bilirubin Urine: NEGATIVE
Glucose, UA: NEGATIVE mg/dL
Specific Gravity, Urine: 1.014 (ref 1.005–1.030)
Urobilinogen, UA: 0.2 mg/dL (ref 0.0–1.0)

## 2012-05-12 LAB — CBC WITH DIFFERENTIAL/PLATELET
Basophils Absolute: 0 10*3/uL (ref 0.0–0.1)
Eosinophils Absolute: 0 10*3/uL (ref 0.0–0.7)
Eosinophils Relative: 0 % (ref 0–5)
HCT: 39.4 % (ref 36.0–46.0)
Hemoglobin: 14.3 g/dL (ref 12.0–15.0)
Lymphocytes Relative: 13 % (ref 12–46)
Lymphocytes Relative: 7 % — ABNORMAL LOW (ref 12–46)
Lymphs Abs: 1 10*3/uL (ref 0.7–4.0)
Lymphs Abs: 1.4 10*3/uL (ref 0.7–4.0)
MCH: 29.8 pg (ref 26.0–34.0)
MCV: 82.8 fL (ref 78.0–100.0)
Monocytes Absolute: 0.8 10*3/uL (ref 0.1–1.0)
Monocytes Relative: 6 % (ref 3–12)
Neutro Abs: 8.5 10*3/uL — ABNORMAL HIGH (ref 1.7–7.7)
Neutrophils Relative %: 81 % — ABNORMAL HIGH (ref 43–77)
Platelets: 302 10*3/uL (ref 150–400)
RBC: 4.77 MIL/uL (ref 3.87–5.11)
RDW: 15.4 % (ref 11.5–15.5)
WBC: 10.5 10*3/uL (ref 4.0–10.5)
WBC: 14.4 10*3/uL — ABNORMAL HIGH (ref 4.0–10.5)

## 2012-05-12 LAB — POCT I-STAT TROPONIN I: Troponin i, poc: 0 ng/mL (ref 0.00–0.08)

## 2012-05-12 LAB — URINE MICROSCOPIC-ADD ON

## 2012-05-12 LAB — GLUCOSE, CAPILLARY: Glucose-Capillary: 191 mg/dL — ABNORMAL HIGH (ref 70–99)

## 2012-05-12 MED ORDER — SODIUM CHLORIDE 0.9 % IJ SOLN
3.0000 mL | Freq: Two times a day (BID) | INTRAMUSCULAR | Status: DC
  Administered 2012-05-13 – 2012-05-16 (×5): 3 mL via INTRAVENOUS

## 2012-05-12 MED ORDER — DILTIAZEM HCL ER COATED BEADS 360 MG PO CP24
360.0000 mg | ORAL_CAPSULE | Freq: Every day | ORAL | Status: DC
  Administered 2012-05-13 – 2012-05-15 (×3): 360 mg via ORAL
  Filled 2012-05-12 (×3): qty 1

## 2012-05-12 MED ORDER — OXYCODONE-ACETAMINOPHEN 5-325 MG PO TABS
1.0000 | ORAL_TABLET | Freq: Once | ORAL | Status: AC
  Administered 2012-05-12: 1 via ORAL
  Filled 2012-05-12: qty 1

## 2012-05-12 MED ORDER — ONDANSETRON HCL 4 MG PO TABS: 4.0000 mg | ORAL_TABLET | Freq: Four times a day (QID) | ORAL | Status: DC

## 2012-05-12 MED ORDER — AMLODIPINE BESYLATE 10 MG PO TABS
10.0000 mg | ORAL_TABLET | Freq: Every day | ORAL | Status: DC
  Administered 2012-05-13: 10 mg via ORAL
  Filled 2012-05-12 (×3): qty 1

## 2012-05-12 MED ORDER — ONDANSETRON HCL 4 MG/2ML IJ SOLN
4.0000 mg | Freq: Once | INTRAMUSCULAR | Status: AC
  Administered 2012-05-12: 4 mg via INTRAVENOUS
  Filled 2012-05-12: qty 2

## 2012-05-12 MED ORDER — LEVOTHYROXINE SODIUM 50 MCG PO TABS
50.0000 ug | ORAL_TABLET | Freq: Every day | ORAL | Status: DC
  Administered 2012-05-13 – 2012-05-15 (×2): 50 ug via ORAL
  Filled 2012-05-12 (×5): qty 1

## 2012-05-12 MED ORDER — SODIUM CHLORIDE 0.9 % IV SOLN
Freq: Once | INTRAVENOUS | Status: AC
  Administered 2012-05-12: 01:00:00 via INTRAVENOUS

## 2012-05-12 MED ORDER — SODIUM CHLORIDE 0.9 % IV SOLN
INTRAVENOUS | Status: AC
  Administered 2012-05-13: 01:00:00 via INTRAVENOUS
  Administered 2012-05-13: 100 mL/h via INTRAVENOUS

## 2012-05-12 MED ORDER — OXYCODONE-ACETAMINOPHEN 5-325 MG PO TABS: 2.0000 | ORAL_TABLET | ORAL | Status: DC | PRN

## 2012-05-12 MED ORDER — MORPHINE SULFATE 2 MG/ML IJ SOLN
1.0000 mg | INTRAMUSCULAR | Status: DC | PRN
  Administered 2012-05-14 – 2012-05-20 (×5): 1 mg via INTRAVENOUS
  Filled 2012-05-12 (×5): qty 1

## 2012-05-12 MED ORDER — ONDANSETRON HCL 4 MG PO TABS: 4.0000 mg | ORAL_TABLET | Freq: Four times a day (QID) | ORAL | Status: DC | PRN

## 2012-05-12 MED ORDER — ONDANSETRON HCL 4 MG/2ML IJ SOLN
4.0000 mg | Freq: Four times a day (QID) | INTRAMUSCULAR | Status: DC | PRN
  Administered 2012-05-13 (×2): 4 mg via INTRAVENOUS
  Filled 2012-05-12 (×2): qty 2

## 2012-05-12 MED ORDER — SODIUM CHLORIDE 0.9 % IV BOLUS (SEPSIS)
1000.0000 mL | Freq: Once | INTRAVENOUS | Status: AC
  Administered 2012-05-12: 1000 mL via INTRAVENOUS

## 2012-05-12 MED ORDER — MORPHINE SULFATE 2 MG/ML IJ SOLN
2.0000 mg | INTRAMUSCULAR | Status: DC | PRN
  Administered 2012-05-12 (×2): 2 mg via INTRAVENOUS
  Filled 2012-05-12 (×2): qty 1

## 2012-05-12 NOTE — H&P (Signed)
PCP:   Bufford Spikes, DO   Chief Complaint:  abd pain n/v  HPI: 78 yo female with 2 -3 days of persistent ruq abd pain with n/v which is worse after eating.  No fever.  Vomit bilious in nature.  No diarrhea.  abd feels bloated.  Still has gallbladder.  Came to ED yesterday and felt to have symptomatic cholelithiasis per imaging yesterday surgery at that time wished for pt to stop her plavix/asa and f/u as outpt to set up elective chole.  However back today due to uncontrolled pain and vomiting despite zofran and pain meds at home.    Review of Systems:  O/w neg  Past Medical History: Past Medical History  Diagnosis Date  . Colon cancer 07/1997  . Hypertension   . Diabetes mellitus   . CHF (congestive heart failure)   . AAA (abdominal aortic aneurysm)   . Atrial fibrillation   . Hypercholesteremia   . Kidney stone     renal calculi   Past Surgical History  Procedure Date  . Hemicolectomy 08/11/1997  . Kidney stone surgery   . Colon surgery     to remove colon ca    Medications: Prior to Admission medications   Medication Sig Start Date End Date Taking? Authorizing Provider  allopurinol (ZYLOPRIM) 100 MG tablet Take 100 mg by mouth as needed. For gout   Yes Historical Provider, MD  amLODipine (NORVASC) 10 MG tablet Take 10 mg by mouth daily.     Yes Historical Provider, MD  atorvastatin (LIPITOR) 40 MG tablet Take 40 mg by mouth daily.     Yes Historical Provider, MD  diltiazem (CARDIZEM CD) 360 MG 24 hr capsule Take 360 mg by mouth daily.     Yes Historical Provider, MD  furosemide (LASIX) 40 MG tablet Take 40 mg by mouth daily.   Yes Historical Provider, MD  glipiZIDE (GLUCOTROL) 10 MG tablet Take 10 mg by mouth daily.     Yes Historical Provider, MD  levothyroxine (SYNTHROID, LEVOTHROID) 50 MCG tablet Take 50 mcg by mouth daily.     Yes Historical Provider, MD  magnesium oxide (MAG-OX) 400 MG tablet Take 400 mg by mouth daily.   Yes Historical Provider, MD  metFORMIN  (GLUCOPHAGE) 500 MG tablet Take 500 mg by mouth 2 (two) times daily with a meal.     Yes Historical Provider, MD  potassium chloride (K-DUR) 10 MEQ tablet Take 10 mEq by mouth daily.     Yes Historical Provider, MD    Allergies:   Allergies  Allergen Reactions  . Iohexol      Code: VOM, Desc: PT REPORTS VOMITING W/ IVP DYE- ARS 12/26/08, Onset Date: [id number]     Social History:  reports that she has never smoked. She has never used smokeless tobacco. She reports that she does not drink alcohol or use illicit drugs.  Family History: negative  Physical Exam: Filed Vitals:   05/12/12 1934 05/12/12 2156  BP: 179/91 149/80  Pulse: 64   Temp: 97.9 F (36.6 C) 97.9 F (36.6 C)  TempSrc: Oral Oral  Resp: 20   SpO2: 94% 96%   General appearance: alert, cooperative and no distress Neck: no JVD and supple, symmetrical, trachea midline Lungs: clear to auscultation bilaterally Heart: regular rate and rhythm, S1, S2 normal, no murmur, click, rub or gallop Abdomen: mildly distended, ruq ttp, pos bs no r/g nonacute abd Extremities: extremities normal, atraumatic, no cyanosis or edema Pulses: 2+ and symmetric Skin: Skin color,  texture, turgor normal. No rashes or lesions Neurologic: Grossly normal    Labs on Admission:   [facility] 05/12/12 1943 05/11/12 2352  NA 146* 141  K 3.7 3.6  CL 101 100  CO2 31 30  GLUCOSE 187* 187*  BUN 19 19  CREATININE 1.23* 1.17*  CALCIUM 10.3 9.7  MG -- --  PHOS -- --    Basename 05/12/12 1943 05/11/12 2352  AST 18 19  ALT 9 9  ALKPHOS 90 95  BILITOT 0.6 0.5  PROT 7.9 8.0  ALBUMIN 4.1 4.2    Basename 05/12/12 1943 05/11/12 2352  WBC 14.4* 10.5  NEUTROABS 12.6* 8.5*  HGB 14.2 14.3  HCT 39.4 39.1  MCV 82.8 82.0  PLT 291 302    Radiological Exams on Admission: US Abdomen Complete  05/12/2012  *RADIOLOGY REPORT*  Clinical Data:  Upper abdominal pain.  Concern for gallstones. History of splenectomy.  COMPLETE ABDOMINAL ULTRASOUND   Comparison:  CT 11/08/2006  Findings:  Gallbladder:  There is a 0.9 cm stone near the gallbladder neck. There is no evidence for gallbladder wall thickening.  The patient does not have a sonographic Murphy's sign.  Common bile duct:  Measures 0.5 cm.  Liver:  The liver is echogenic and slightly heterogeneous.  IVC:  Appears normal.  Pancreas:  Poorly visualized due to bowel gas.  Spleen:  The patient had a splenectomy but there is a round structure in the left upper quadrant consistent with a splenule. Splenule measures up to 2.4 cm and this was present on the prior examination.  Right Kidney:  Right kidney measures 10.1 cm in length without hydronephrosis.  Left Kidney:  Left kidney is difficult to evaluate and roughly measures 9.6 cm in length without hydronephrosis.  Abdominal aorta: Retroperitoneal structures are difficult to evaluate.  Cannot exclude aneurysmal dilatation of the mid abdominal aorta.  IMPRESSION: There is a 0.9 cm gallstone near the gallbladder neck.  No evidence for biliary dilatation.  Limited evaluation of the abdominal aorta.  There is concern for aneurysmal dilatation in the mid abdominal aorta.  This could be better evaluated with CT.   Original Report Authenticated By: Richarda Overlie, M.D.     Assessment/Plan Present on Admission:  77 yo female with symptomatic cholelithiasis ?acute chole .Abdominal pain, acute, right upper quadrant .Nausea and vomiting .Dehydration .Cholelithiasis without obstruction .History of colon cancer, stage III .AAA (abdominal aortic aneurysm) .CHF (congestive heart failure) .Atrial fibrillation  Ivf.  Iv pain and zofran meds.  Surgery called and will be seeing pt.  Keep npo after midnight for possible chole in am.  lfts and alk phos normal.  chf compensated.  afib rate controlled.  abd benign.  Place on tele.    Lulie Hurd A [phone] 05/12/2012, 10:37 PM

## 2012-05-12 NOTE — ED Notes (Signed)
Pt vitals was taken at triage but not entered until :40 am.

## 2012-05-12 NOTE — Progress Notes (Signed)
Pt admitted to the unit. Pt is alert and oriented. Pt oriented to room, staff, and call bell. Bed in lowest position. Full assessment to Epic. Call bell with in reach. Told to call for assists. Will continue to monitor.  Nicole [name] E  

## 2012-05-12 NOTE — ED Notes (Signed)
Pt was seen here last night and dx with gallstones.  This am she awoke with nausea and vomiting.  She denies pain - states the last time she took her pain medication was 1530.

## 2012-05-12 NOTE — ED Provider Notes (Addendum)
History     CSN: [id number]  Arrival date & time 05/11/12  2344   First MD Initiated Contact with Patient 05/12/12 0014      Chief Complaint  Patient presents with  . Abdominal Pain    (Consider location/radiation/quality/duration/timing/severity/associated sxs/prior treatment) HPI 78 year old female presents to emergency room from home with complaint of upper abdominal pain and nausea and vomiting x2. Patient reports symptoms started around 3:00 after having a lunch of fried chicken livers, onions, and salad. She denies previous symptoms of similar pain. She denies any chest pain, shortness of breath. No prior history of gallstones. Patient reports she has a history of abdominal aortic aneurysm, which is stable followed by vascular surgery. She denies any fever, no diarrhea. No sick contacts. No one else 8 a similar meal. No back pain. Patient's blood pressure noted be elevated, reports she did not take her medications today  Past Medical History  Diagnosis Date  . Colon cancer 07/1997  . Hypertension   . Diabetes mellitus   . CHF (congestive heart failure)   . AAA (abdominal aortic aneurysm)   . Atrial fibrillation   . Kidney stone   . Hypercholesteremia     Past Surgical History  Procedure Date  . Hemicolectomy 08/11/1997  . Colon surgery   . Kidney stone surgery     History reviewed. No pertinent family history.  History  Substance Use Topics  . Smoking status: Never Smoker   . Smokeless tobacco: Never Used  . Alcohol Use: No    OB History    Grav Para Term Preterm Abortions TAB SAB Ect Mult Living                  Review of Systems  All other systems reviewed and are negative.    Allergies  Iohexol  Home Medications   Current Outpatient Rx  Name Route Sig Dispense Refill  . ALLOPURINOL 100 MG PO TABS Oral Take 100 mg by mouth as needed. For gout    . AMLODIPINE BESYLATE 10 MG PO TABS Oral Take 10 mg by mouth daily.      . ASPIRIN 81 MG PO TABS  Oral Take 81 mg by mouth daily.      . ATORVASTATIN CALCIUM 40 MG PO TABS Oral Take 40 mg by mouth daily.      Marland Kitchen CALCIUM CARBONATE 1250 MG PO TABS Oral Take 1 tablet by mouth daily.      Marland Kitchen CARVEDILOL 12.5 MG PO TABS Oral Take 12.5 mg by mouth 2 (two) times daily with a meal.      . CLONIDINE HCL 0.1 MG PO TABS Oral Take 0.1 mg by mouth daily.      Marland Kitchen DIGOXIN 0.125 MG PO TABS Oral Take 125 mcg by mouth daily.      Marland Kitchen DILTIAZEM HCL ER COATED BEADS 360 MG PO CP24 Oral Take 360 mg by mouth daily.      Marland Kitchen DOXYCYCLINE HYCLATE 100 MG PO CAPS Oral Take 100 mg by mouth 2 (two) times daily.      Marland Kitchen EZETIMIBE 10 MG PO TABS Oral Take 10 mg by mouth daily.      . FUROSEMIDE 20 MG PO TABS Oral Take 20 mg by mouth daily.      Marland Kitchen GLIPIZIDE 10 MG PO TABS Oral Take 10 mg by mouth daily.      Marland Kitchen HYDROCHLOROTHIAZIDE 25 MG PO TABS Oral Take 25 mg by mouth daily.      Marland Kitchen  LEVOTHYROXINE SODIUM 50 MCG PO TABS Oral Take 50 mcg by mouth daily.      Marland Kitchen LOSARTAN POTASSIUM 100 MG PO TABS Oral Take 100 mg by mouth daily.      Marland Kitchen MAGNESIUM OXIDE 400 MG PO TABS Oral Take 400 mg by mouth daily.    Marland Kitchen METFORMIN HCL 500 MG PO TABS Oral Take 500 mg by mouth 2 (two) times daily with a meal.      . POTASSIUM CHLORIDE ER 10 MEQ PO TBCR Oral Take 10 mEq by mouth daily.      Marland Kitchen ONDANSETRON HCL 4 MG PO TABS Oral Take 1 tablet (4 mg total) by mouth every 6 (six) hours. PRN nausea 12 tablet 0  . OXYCODONE-ACETAMINOPHEN 5-325 MG PO TABS Oral Take 2 tablets by mouth every 4 (four) hours as needed for pain. 20 tablet 0    BP 159/94  Pulse 100  Temp 97.5 F (36.4 C) (Oral)  Resp 17  SpO2 97%  Physical Exam  Nursing note and vitals reviewed. Constitutional: She is oriented to person, place, and time. She appears well-developed and well-nourished.  HENT:  Head: Normocephalic and atraumatic.  Nose: Nose normal.  Mouth/Throat: Oropharynx is clear and moist.  Eyes: Conjunctivae normal and EOM are normal. Pupils are equal, round, and reactive to  light.  Neck: Normal range of motion. Neck supple. No JVD present. No tracheal deviation present. No thyromegaly present.  Cardiovascular: Normal rate, regular rhythm, normal heart sounds and intact distal pulses.  Exam reveals no gallop and no friction rub.   No murmur heard. Pulmonary/Chest: Effort normal and breath sounds normal. No stridor. No respiratory distress. She has no wheezes. She has no rales. She exhibits no tenderness.  Abdominal: Soft. Bowel sounds are normal. She exhibits no distension and no mass. There is tenderness (Show tenderness in right upper quadrant with positive Murphy sign). There is rebound. There is no guarding.  Musculoskeletal: Normal range of motion. She exhibits no edema and no tenderness.  Lymphadenopathy:    She has no cervical adenopathy.  Neurological: She is alert and oriented to person, place, and time. She exhibits normal muscle tone. Coordination normal.  Skin: Skin is warm and dry. No rash noted. No erythema. No pallor.  Psychiatric: She has a normal mood and affect. Her behavior is normal. Judgment and thought content normal.    ED Course  Procedures (including critical care time)  Labs Reviewed  CBC WITH DIFFERENTIAL - Abnormal; Notable for the following:    MCHC 36.6 (*)     Neutrophils Relative 81 (*)     Neutro Abs 8.5 (*)     All other components within normal limits  COMPREHENSIVE METABOLIC PANEL - Abnormal; Notable for the following:    Glucose, Bld 187 (*)     Creatinine, Ser 1.17 (*)     GFR calc non Af Amer 43 (*)     GFR calc Af Amer 50 (*)     All other components within normal limits  URINALYSIS, ROUTINE W REFLEX MICROSCOPIC - Abnormal; Notable for the following:    APPearance CLOUDY (*)     Hgb urine dipstick SMALL (*)     Ketones, ur 15 (*)     Protein, ur 100 (*)     All other components within normal limits  URINE MICROSCOPIC-ADD ON - Abnormal; Notable for the following:    Squamous Epithelial / LPF FEW (*)     All  other components within normal limits  POCT I-STAT TROPONIN I   US Abdomen Complete  05/12/2012  *RADIOLOGY REPORT*  Clinical Data:  Upper abdominal pain.  Concern for gallstones. History of splenectomy.  COMPLETE ABDOMINAL ULTRASOUND  Comparison:  CT 11/08/2006  Findings:  Gallbladder:  There is a 0.9 cm stone near the gallbladder neck. There is no evidence for gallbladder wall thickening.  The patient does not have a sonographic Murphy's sign.  Common bile duct:  Measures 0.5 cm.  Liver:  The liver is echogenic and slightly heterogeneous.  IVC:  Appears normal.  Pancreas:  Poorly visualized due to bowel gas.  Spleen:  The patient had a splenectomy but there is a round structure in the left upper quadrant consistent with a splenule. Splenule measures up to 2.4 cm and this was present on the prior examination.  Right Kidney:  Right kidney measures 10.1 cm in length without hydronephrosis.  Left Kidney:  Left kidney is difficult to evaluate and roughly measures 9.6 cm in length without hydronephrosis.  Abdominal aorta: Retroperitoneal structures are difficult to evaluate.  Cannot exclude aneurysmal dilatation of the mid abdominal aorta.  IMPRESSION: There is a 0.9 cm gallstone near the gallbladder neck.  No evidence for biliary dilatation.  Limited evaluation of the abdominal aorta.  There is concern for aneurysmal dilatation in the mid abdominal aorta.  This could be better evaluated with CT.   Original Report Authenticated By: Richarda Overlie, M.D.     Date: 05/12/2012  Rate: 117  Rhythm: atrial fibrillation  QRS Axis: normal  Intervals: normal  ST/T Wave abnormalities: nonspecific ST/T changes  Conduction Disutrbances:right bundle branch block  Narrative Interpretation:   Old EKG Reviewed: unchanged   1. Cholelithiasis       MDM  78 year old female with cholelithiasis which appears to be symptomatic. She does not have any symptoms of cholecystitis. Discuss case with Dr. Lindie Spruce as patient had  return of pain after morphine and worn off. Given that patient is on Plavix, he wishes to try to get her pain under control in the emergency department and sent home with oral pain medications, she is to stop her Plavix. She is to followup in the clinic. Patient given precautions for return to the emergency department and instructed to followup with Dr. Lindie Spruce in his office.        Nicole Mackie, MD 05/12/12 1610  Nicole Mackie, MD 05/12/12 [phone]

## 2012-05-12 NOTE — ED Notes (Signed)
Old and New EKG given to Dr Otter 

## 2012-05-12 NOTE — ED Provider Notes (Signed)
I saw and evaluated the patient, reviewed the resident's note and I agree with the findings and plan. Agree with EKG interpretation if present.   Pt with abdominal pain and nausea. Seen last night for same and found to have gall stones. Discharged then with plan for outpatient management but pain returned. Now has leukocytosis so plan for admission.   Charles B. Bernette Mayers, MD 05/12/12 2312

## 2012-05-12 NOTE — ED Provider Notes (Signed)
History     CSN: [id number]  Arrival date & time 05/12/12  1929   First MD Initiated Contact with Patient 05/12/12 2128      Chief Complaint  Patient presents with  . Emesis    (Consider location/radiation/quality/duration/timing/severity/associated sxs/prior treatment) Patient is a 78 y.o. female presenting with vomiting. The history is provided by the patient.  Emesis  This is a new problem. The current episode started yesterday. The problem occurs 5 to 10 times per day. The problem has not changed since onset.The emesis has an appearance of stomach contents. There has been no fever. Associated symptoms include abdominal pain. Pertinent negatives include no cough, no diarrhea, no fever, no headaches and no URI.    Past Medical History  Diagnosis Date  . Colon cancer 07/1997  . Hypertension   . Diabetes mellitus   . CHF (congestive heart failure)   . AAA (abdominal aortic aneurysm)   . Atrial fibrillation   . Hypercholesteremia   . Kidney stone     renal calculi    Past Surgical History  Procedure Date  . Hemicolectomy 08/11/1997  . Kidney stone surgery   . Colon surgery     to remove colon ca    No family history on file.  History  Substance Use Topics  . Smoking status: Never Smoker   . Smokeless tobacco: Never Used  . Alcohol Use: No    OB History    Grav Para Term Preterm Abortions TAB SAB Ect Mult Living                  Review of Systems  Constitutional: Negative for fever.  Respiratory: Negative for cough and shortness of breath.   Cardiovascular: Negative for chest pain.  Gastrointestinal: Positive for vomiting and abdominal pain. Negative for nausea and diarrhea.  Neurological: Negative for headaches.  All other systems reviewed and are negative.    Allergies  Iohexol  Home Medications   Current Outpatient Rx  Name Route Sig Dispense Refill  . ALLOPURINOL 100 MG PO TABS Oral Take 100 mg by mouth as needed. For gout    . AMLODIPINE  BESYLATE 10 MG PO TABS Oral Take 10 mg by mouth daily.      . ATORVASTATIN CALCIUM 40 MG PO TABS Oral Take 40 mg by mouth daily.      Marland Kitchen DILTIAZEM HCL ER COATED BEADS 360 MG PO CP24 Oral Take 360 mg by mouth daily.      . FUROSEMIDE 40 MG PO TABS Oral Take 40 mg by mouth daily.    Marland Kitchen GLIPIZIDE 10 MG PO TABS Oral Take 10 mg by mouth daily.      Marland Kitchen LEVOTHYROXINE SODIUM 50 MCG PO TABS Oral Take 50 mcg by mouth daily.      Marland Kitchen MAGNESIUM OXIDE 400 MG PO TABS Oral Take 400 mg by mouth daily.    Marland Kitchen METFORMIN HCL 500 MG PO TABS Oral Take 500 mg by mouth 2 (two) times daily with a meal.      . POTASSIUM CHLORIDE ER 10 MEQ PO TBCR Oral Take 10 mEq by mouth daily.        BP 149/80  Pulse 64  Temp 97.9 F (36.6 C) (Oral)  Resp 20  SpO2 96%  Physical Exam  Nursing note and vitals reviewed. Constitutional: She is oriented to person, place, and time. She appears well-developed and well-nourished. No distress.  HENT:  Head: Normocephalic and atraumatic.  Eyes: EOM are normal. Pupils  are equal, round, and reactive to light.  Neck: Normal range of motion.  Cardiovascular: Normal rate and normal heart sounds.   Pulmonary/Chest: Effort normal and breath sounds normal. No respiratory distress.  Abdominal: Soft. She exhibits no distension. There is no tenderness. There is no rigidity, no rebound, no guarding and no CVA tenderness.    Musculoskeletal: Normal range of motion.  Neurological: She is alert and oriented to person, place, and time.  Skin: Skin is warm and dry.    ED Course  Procedures (including critical care time)  Labs Reviewed  CBC WITH DIFFERENTIAL - Abnormal; Notable for the following:    WBC 14.4 (*)     Neutrophils Relative 87 (*)     Neutro Abs 12.6 (*)     Lymphocytes Relative 7 (*)     All other components within normal limits  COMPREHENSIVE METABOLIC PANEL - Abnormal; Notable for the following:    Sodium 146 (*)     Glucose, Bld 187 (*)     Creatinine, Ser 1.23 (*)     GFR  calc non Af Amer 41 (*)     GFR calc Af Amer 47 (*)     All other components within normal limits  GLUCOSE, CAPILLARY - Abnormal; Notable for the following:    Glucose-Capillary 191 (*)     All other components within normal limits   US Abdomen Complete  05/12/2012  *RADIOLOGY REPORT*  Clinical Data:  Upper abdominal pain.  Concern for gallstones. History of splenectomy.  COMPLETE ABDOMINAL ULTRASOUND  Comparison:  CT 11/08/2006  Findings:  Gallbladder:  There is a 0.9 cm stone near the gallbladder neck. There is no evidence for gallbladder wall thickening.  The patient does not have a sonographic Murphy's sign.  Common bile duct:  Measures 0.5 cm.  Liver:  The liver is echogenic and slightly heterogeneous.  IVC:  Appears normal.  Pancreas:  Poorly visualized due to bowel gas.  Spleen:  The patient had a splenectomy but there is a round structure in the left upper quadrant consistent with a splenule. Splenule measures up to 2.4 cm and this was present on the prior examination.  Right Kidney:  Right kidney measures 10.1 cm in length without hydronephrosis.  Left Kidney:  Left kidney is difficult to evaluate and roughly measures 9.6 cm in length without hydronephrosis.  Abdominal aorta: Retroperitoneal structures are difficult to evaluate.  Cannot exclude aneurysmal dilatation of the mid abdominal aorta.  IMPRESSION: There is a 0.9 cm gallstone near the gallbladder neck.  No evidence for biliary dilatation.  Limited evaluation of the abdominal aorta.  There is concern for aneurysmal dilatation in the mid abdominal aorta.  This could be better evaluated with CT.   Original Report Authenticated By: Richarda Overlie, M.D.     Date: 05/12/2012  Rate: 81  Rhythm: atrial fibrillation  QRS Axis: normal  Intervals: normal  ST/T Wave abnormalities: normal  Conduction Disutrbances:none  Narrative Interpretation: Afib  Old EKG Reviewed: unchanged     1. Gallstones   2. Leukocytosis   3. Vomiting       MDM    Pt returns to ED with continued vomiting after being discharged last night after being dx with gallstones. Concern now for acute cholecystitis as elevated WBC. Will admit to medicine and surgery will see tonight or tomorrow.          Daleen Bo, MD 05/12/12 [phone]

## 2012-05-13 ENCOUNTER — Encounter (HOSPITAL_COMMUNITY): Payer: Self-pay | Admitting: *Deleted

## 2012-05-13 DIAGNOSIS — D649 Anemia, unspecified: Secondary | ICD-10-CM

## 2012-05-13 DIAGNOSIS — K8010 Calculus of gallbladder with chronic cholecystitis without obstruction: Secondary | ICD-10-CM

## 2012-05-13 LAB — CBC
MCH: 29.3 pg (ref 26.0–34.0)
MCHC: 35.1 g/dL (ref 30.0–36.0)
RDW: 15.7 % — ABNORMAL HIGH (ref 11.5–15.5)

## 2012-05-13 LAB — COMPREHENSIVE METABOLIC PANEL
ALT: 8 U/L (ref 0–35)
AST: 14 U/L (ref 0–37)
Alkaline Phosphatase: 80 U/L (ref 39–117)
CO2: 32 mEq/L (ref 19–32)
Chloride: 101 mEq/L (ref 96–112)
GFR calc Af Amer: 46 mL/min — ABNORMAL LOW (ref >90)
GFR calc non Af Amer: 40 mL/min — ABNORMAL LOW (ref >90)
Glucose, Bld: 169 mg/dL — ABNORMAL HIGH (ref 70–99)
Potassium: 3.4 mEq/L — ABNORMAL LOW (ref 3.5–5.1)
Sodium: 145 mEq/L (ref 135–145)
Total Bilirubin: 0.4 mg/dL (ref 0.3–1.2)

## 2012-05-13 LAB — PROTIME-INR
INR: 1.11 (ref 0.00–1.49)
Prothrombin Time: 14.2 seconds (ref 11.6–15.2)

## 2012-05-13 LAB — GLUCOSE, CAPILLARY: Glucose-Capillary: 133 mg/dL — ABNORMAL HIGH (ref 70–99)

## 2012-05-13 MED ORDER — CHLORHEXIDINE GLUCONATE 4 % EX LIQD
1.0000 "application " | Freq: Once | CUTANEOUS | Status: DC
  Filled 2012-05-13 (×2): qty 15

## 2012-05-13 MED ORDER — CEFAZOLIN SODIUM-DEXTROSE 2-3 GM-% IV SOLR
2.0000 g | INTRAVENOUS | Status: AC
  Administered 2012-05-14 (×2): 2 g via INTRAVENOUS
  Filled 2012-05-13: qty 50

## 2012-05-13 MED ORDER — PROMETHAZINE HCL 25 MG/ML IJ SOLN
25.0000 mg | INTRAMUSCULAR | Status: DC | PRN
  Administered 2012-05-13 (×2): 25 mg via INTRAVENOUS
  Filled 2012-05-13 (×2): qty 1

## 2012-05-13 MED ORDER — DIPHENHYDRAMINE HCL 50 MG/ML IJ SOLN: 25.0000 mg | Freq: Four times a day (QID) | INTRAMUSCULAR | Status: DC | PRN

## 2012-05-13 MED ORDER — HYDRALAZINE HCL 20 MG/ML IJ SOLN
10.0000 mg | Freq: Four times a day (QID) | INTRAMUSCULAR | Status: DC | PRN
  Administered 2012-05-13 – 2012-05-21 (×2): 10 mg via INTRAVENOUS
  Filled 2012-05-13: qty 1
  Filled 2012-05-13: qty 0.5

## 2012-05-13 MED ORDER — POTASSIUM CHLORIDE CRYS ER 20 MEQ PO TBCR
40.0000 meq | EXTENDED_RELEASE_TABLET | Freq: Once | ORAL | Status: AC
  Administered 2012-05-13: 40 meq via ORAL
  Filled 2012-05-13: qty 2

## 2012-05-13 MED ORDER — DIPHENHYDRAMINE HCL 50 MG/ML IJ SOLN
25.0000 mg | Freq: Four times a day (QID) | INTRAMUSCULAR | Status: DC | PRN
  Administered 2012-05-13 – 2012-05-14 (×3): 25 mg via INTRAVENOUS
  Filled 2012-05-13 (×3): qty 1

## 2012-05-13 MED ORDER — DIPHENHYDRAMINE HCL 25 MG PO CAPS: 25.0000 mg | ORAL_CAPSULE | Freq: Four times a day (QID) | ORAL | Status: DC | PRN

## 2012-05-13 MED ORDER — WHITE PETROLATUM GEL
Status: AC
  Administered 2012-05-13: 0.2
  Filled 2012-05-13: qty 5

## 2012-05-13 MED ORDER — SODIUM CHLORIDE 0.9 % IV SOLN
INTRAVENOUS | Status: DC
  Administered 2012-05-13 – 2012-05-14 (×2): via INTRAVENOUS

## 2012-05-13 MED ORDER — INFLUENZA VIRUS VACC SPLIT PF IM SUSP
0.5000 mL | INTRAMUSCULAR | Status: AC
  Administered 2012-05-14: 0.5 mL via INTRAMUSCULAR
  Filled 2012-05-13: qty 0.5

## 2012-05-13 MED ORDER — INSULIN ASPART 100 UNIT/ML ~~LOC~~ SOLN
0.0000 [IU] | SUBCUTANEOUS | Status: DC
  Administered 2012-05-13: 1 [IU] via SUBCUTANEOUS
  Administered 2012-05-13: 2 [IU] via SUBCUTANEOUS
  Administered 2012-05-14: 3 [IU] via SUBCUTANEOUS
  Administered 2012-05-15 (×3): 2 [IU] via SUBCUTANEOUS
  Administered 2012-05-15: 3 [IU] via SUBCUTANEOUS
  Administered 2012-05-15 (×2): 2 [IU] via SUBCUTANEOUS
  Administered 2012-05-16: 1 [IU] via SUBCUTANEOUS
  Administered 2012-05-16 (×4): 2 [IU] via SUBCUTANEOUS
  Administered 2012-05-16: 3 [IU] via SUBCUTANEOUS
  Administered 2012-05-17 – 2012-05-18 (×5): 1 [IU] via SUBCUTANEOUS
  Administered 2012-05-18 – 2012-05-19 (×4): 2 [IU] via SUBCUTANEOUS
  Administered 2012-05-19 (×2): 3 [IU] via SUBCUTANEOUS
  Administered 2012-05-19 – 2012-05-20 (×2): 2 [IU] via SUBCUTANEOUS
  Administered 2012-05-20: 3 [IU] via SUBCUTANEOUS
  Administered 2012-05-20 (×3): 2 [IU] via SUBCUTANEOUS

## 2012-05-13 MED ORDER — CHLORHEXIDINE GLUCONATE 4 % EX LIQD
1.0000 "application " | Freq: Once | CUTANEOUS | Status: AC
  Administered 2012-05-13: 1 via TOPICAL
  Filled 2012-05-13 (×2): qty 15

## 2012-05-13 NOTE — Progress Notes (Signed)
S: patient feels better, less pain and rested ok this am PE: VS:BP 164/89  Pulse 90  Temp 98 F (36.7 C) (Oral)  Resp 20  Ht 5\' 11"  (1.803 m)  Wt 176 lb 5.9 oz (80 kg)  BMI 24.60 kg/m2  SpO2 96% Gen Alert, nad Abd: soft, mild RUQ tender  Labs noted Imp gallstones/cholecystitits Plan. Lap chole, possible open - Hx colon surgery. I have discussed the indications for laparoscopic cholecystectomy with her and provided educational material. We have discussed the risks of surgery, including general risks such as bleeding, infection, lung and heart issues etc. We have also discussed the potential for injuries to other organs, bile duct leaks, and other unexpected events. We have also talked about the fact that this may need to be converted to open under certain circumstances. We discussed the typical post op recovery and the fact that there is a good likelihood of improvement in symptoms and return to normal activity.  She understands this and wishes to proceed to schedule surgery. I believe all of her questions have been answered.

## 2012-05-13 NOTE — Consult Note (Signed)
Reason for Consult:Cholecystitis  Referring Physician: Dr. Tarry Kos  Nicole [name] is an 78 y.o. female.  HPI: This patient presents with 2 days of nausea, vomiting, upper abdominal pain R>L. Patient reports symptoms started Sunday around 3:00 after having a lunch of fried chicken livers, onions, and salad. She denies previous symptoms of similar pain. She denies any chest pain, shortness of breath. No prior history of gallstones. Patient reports she has a history of abdominal aortic aneurysm, which is stable followed by vascular surgery. She denies any fever, no diarrhea. No sick contacts. She was diagnosed with cholelithiasis, but was discharged home with plans for outpatient cholecystectomy due to her Plavix.  However, her symptoms are not controlled, so she returned to the ED.    Past Medical History  Diagnosis Date  . Colon cancer 07/1997  . Hypertension   . Diabetes mellitus   . CHF (congestive heart failure)   . AAA (abdominal aortic aneurysm)   . Atrial fibrillation   . Hypercholesteremia   . Kidney stone     renal calculi    Past Surgical History  Procedure Date  . Hemicolectomy 08/11/1997  . Kidney stone surgery   . Colon surgery     to remove colon ca  With splenectomy - ?left hemicolectomy  No family history on file.  Social History:  reports that she has never smoked. She has never used smokeless tobacco. She reports that she does not drink alcohol or use illicit drugs.  Allergies:  Allergies  Allergen Reactions  . Iohexol      Code: VOM, Desc: PT REPORTS VOMITING W/ IVP DYE- ARS 12/26/08, Onset Date: [id number]     Medications: I have reviewed the patient's current medications.  Results for orders placed during the hospital encounter of 05/12/12 (from the past 48 hour(s))  CBC WITH DIFFERENTIAL     Status: Abnormal   Collection Time   05/12/12  7:43 PM      Component Value Range Comment   WBC 14.4 (*) 4.0 - 10.5 K/uL    RBC 4.76  3.87 - 5.11 MIL/uL    Hemoglobin 14.2  12.0 - 15.0 g/dL    HCT 47.8  29.5 - 62.1 %    MCV 82.8  78.0 - 100.0 fL    MCH 29.8  26.0 - 34.0 pg    MCHC 36.0  30.0 - 36.0 g/dL    RDW 30.8  65.7 - 84.6 %    Platelets 291  150 - 400 K/uL    Neutrophils Relative 87 (*) 43 - 77 %    Neutro Abs 12.6 (*) 1.7 - 7.7 K/uL    Lymphocytes Relative 7 (*) 12 - 46 %    Lymphs Abs 1.0  0.7 - 4.0 K/uL    Monocytes Relative 6  3 - 12 %    Monocytes Absolute 0.8  0.1 - 1.0 K/uL    Eosinophils Relative 0  0 - 5 %    Eosinophils Absolute 0.0  0.0 - 0.7 K/uL    Basophils Relative 0  0 - 1 %    Basophils Absolute 0.0  0.0 - 0.1 K/uL   COMPREHENSIVE METABOLIC PANEL     Status: Abnormal   Collection Time   05/12/12  7:43 PM      Component Value Range Comment   Sodium 146 (*) 135 - 145 mEq/L    Potassium 3.7  3.5 - 5.1 mEq/L    Chloride 101  96 - 112 mEq/L  CO2 31  19 - 32 mEq/L    Glucose, Bld 187 (*) 70 - 99 mg/dL    BUN 19  6 - 23 mg/dL    Creatinine, Ser 4.09 (*) 0.50 - 1.10 mg/dL    Calcium 81.1  8.4 - 10.5 mg/dL    Total Protein 7.9  6.0 - 8.3 g/dL    Albumin 4.1  3.5 - 5.2 g/dL    AST 18  0 - 37 U/L    ALT 9  0 - 35 U/L    Alkaline Phosphatase 90  39 - 117 U/L    Total Bilirubin 0.6  0.3 - 1.2 mg/dL    GFR calc non Af Amer 41 (*) >90 mL/min    GFR calc Af Amer 47 (*) >90 mL/min   GLUCOSE, CAPILLARY     Status: Abnormal   Collection Time   05/12/12  7:48 PM      Component Value Range Comment   Glucose-Capillary 191 (*) 70 - 99 mg/dL     US Abdomen Complete  05/12/2012  *RADIOLOGY REPORT*  Clinical Data:  Upper abdominal pain.  Concern for gallstones. History of splenectomy.  COMPLETE ABDOMINAL ULTRASOUND  Comparison:  CT 11/08/2006  Findings:  Gallbladder:  There is a 0.9 cm stone near the gallbladder neck. There is no evidence for gallbladder wall thickening.  The patient does not have a sonographic Murphy's sign.  Common bile duct:  Measures 0.5 cm.  Liver:  The liver is echogenic and slightly heterogeneous.   IVC:  Appears normal.  Pancreas:  Poorly visualized due to bowel gas.  Spleen:  The patient had a splenectomy but there is a round structure in the left upper quadrant consistent with a splenule. Splenule measures up to 2.4 cm and this was present on the prior examination.  Right Kidney:  Right kidney measures 10.1 cm in length without hydronephrosis.  Left Kidney:  Left kidney is difficult to evaluate and roughly measures 9.6 cm in length without hydronephrosis.  Abdominal aorta: Retroperitoneal structures are difficult to evaluate.  Cannot exclude aneurysmal dilatation of the mid abdominal aorta.  IMPRESSION: There is a 0.9 cm gallstone near the gallbladder neck.  No evidence for biliary dilatation.  Limited evaluation of the abdominal aorta.  There is concern for aneurysmal dilatation in the mid abdominal aorta.  This could be better evaluated with CT.   Original Report Authenticated By: Richarda Overlie, M.D.     Review of Systems  Eyes: Negative.   Gastrointestinal: Positive for nausea, vomiting and abdominal pain.  Neurological: Negative.   Endo/Heme/Allergies: Negative.    Blood pressure 161/88, pulse 83, temperature 98.5 F (36.9 C), temperature source Oral, resp. rate 20, height 5\' 11"  (1.803 m), weight 176 lb 5.9 oz (80 kg), SpO2 92.00%. Physical Exam Elderly female in NAD HEENT:  EOMI, sclera anicteric Neck:  No masses, no thyromegaly Lungs:  CTA bilaterally; normal respiratory effort CV:  Regular rate and rhythm; no murmurs Abd:  +bowel sounds, mildly distended; healed midline incision; mildly tender in RUQ Ext:  Well-perfused; no edema Skin:  Warm, dry; no sign of jaundice  Assessment/Plan: Symptomatic cholelithiasis Anticoagulated with Plavix and aspirin.  NPO x ice chips IV antibiotics Will plan laparoscopic cholecystectomy later in the week.  Will follow patient.   Hold plavix and aspirin.  [name] K. 05/13/2012, 2:33 AM

## 2012-05-13 NOTE — Progress Notes (Signed)
[facility] PROGRESS NOTE  Nicole [name] LKG:[id number] DOB: [birth year] DOA: 05/12/2012 PCP: Bufford Spikes, DO  Assessment/Plan: Principal Problem:  *Abdominal pain, acute, right upper quadrant Active Problems:  History of colon cancer, stage III  Nausea and vomiting  Dehydration  Cholelithiasis without obstruction  AAA (abdominal aortic aneurysm)  CHF (congestive heart failure)  Atrial fibrillation   Cholecystitis [facility] taking patient to OR 10/23. U/S shows stone near the gall bladder neck  Nausea, vomiting Secondary to cholecystitis Treating symptomatically  CHF. Asymptomatic, Appears euvolemic.  Will monitor and be conservative with IVF.  Atrial Fibrillation Rate controlled Not on coumadin.  Rather on aspirin and plavix  Diabetes Mellitus with Neuropathy Metformin and Glipizide at home SSI - sensitive inpatient.  Will not start insulin at this time as she will be NPO for surgery after midnight.  Mild hypokalemia. Replete  Dehydration Resolved with IVF, continue IVF at 50 ml/hour as patient is able to take in very little PO. Creatinine at baseline.   Code Status:  Full code Family Communication: at bedside Disposition Plan: to home when ready  Brief narrative:  78 yo female with 2 -3 days of persistent ruq abd pain with n/v which is worse after eating. No fever. Vomit bilious in nature. No diarrhea. abd feels bloated. Still has gallbladder. Came to ED yesterday and felt to have symptomatic cholelithiasis per imaging yesterday surgery at that time wished for pt to stop her plavix/asa and f/u as outpt to set up elective chole. However back today due to uncontrolled pain and vomiting despite zofran and pain meds at home.   Consultants:  [facility]  Procedures:    Antibiotics:  Ancef on call to OR  HPI/Subjective: Complaining of nausea, and itching in her feet which she feels is due to her diabetes.  Objective: Filed Vitals:   05/13/12 0600 05/13/12  0643 05/13/12 0740 05/13/12 1105  BP: 183/102 179/102 168/104 164/89  Pulse: 90     Temp: 98 F (36.7 C)     TempSrc: Oral     Resp: 20     Height:      Weight:      SpO2: 96%       Intake/Output Summary (Last 24 hours) at 05/13/12 1454 Last data filed at 05/13/12 0700  Gross per 24 hour  Intake      0 ml  Output      1 ml  Net     -1 ml    Exam:   General:  A&O, NAD  Cardiovascular: RRR, No M/R/G, no Lower Extremity Edema  Respiratory: CTA, No increased work of breathing  Abdomen: Soft, NT, ND, positive bowel sounds, no obvious masses  Neurological:  Cranial Nerves 2 - 12 are grossly intact, exam non focal  Psychiatric  Skin:  No rashes, bruises, or lesions  Data Reviewed: Basic Metabolic Panel:  Lab 05/13/12 6644 05/12/12 1943 05/11/12 2352  NA 145 146* 141  K 3.4* 3.7 3.6  CL 101 101 100  CO2 32 31 30  GLUCOSE 169* 187* 187*  BUN 18 19 19   CREATININE 1.25* 1.23* 1.17*  CALCIUM 9.6 10.3 9.7  MG -- -- --  PHOS -- -- --   Liver Function Tests:  Lab 05/13/12 0645 05/12/12 1943 05/11/12 2352  AST 14 18 19   ALT 8 9 9   ALKPHOS 80 90 95  BILITOT 0.4 0.6 0.5  PROT 7.3 7.9 8.0  ALBUMIN 3.6 4.1 4.2   No results found for this basename: LIPASE:5,AMYLASE:5  in the last 168 hours No results found for this basename: AMMONIA:5 in the last 168 hours CBC:  Lab 05/13/12 0645 05/12/12 1943 05/11/12 2352  WBC 11.4* 14.4* 10.5  NEUTROABS -- 12.6* 8.5*  HGB 12.9 14.2 14.3  HCT 36.8 39.4 39.1  MCV 83.6 82.8 82.0  PLT 290 291 302   Cardiac Enzymes: No results found for this basename: CKTOTAL:5,CKMB:5,CKMBINDEX:5,TROPONINI:5 in the last 168 hours BNP (last 3 results) No results found for this basename: PROBNP:3 in the last 8760 hours CBG:  Lab 05/12/12 1948  GLUCAP 191*    No results found for this or any previous visit (from the past 240 hour(s)).   Studies: US Abdomen Complete  05/12/2012  *RADIOLOGY REPORT*  Clinical Data:  Upper abdominal pain.   Concern for gallstones. History of splenectomy.  COMPLETE ABDOMINAL ULTRASOUND  Comparison:  CT 11/08/2006  Findings:  Gallbladder:  There is a 0.9 cm stone near the gallbladder neck. There is no evidence for gallbladder wall thickening.  The patient does not have a sonographic Murphy's sign.  Common bile duct:  Measures 0.5 cm.  Liver:  The liver is echogenic and slightly heterogeneous.  IVC:  Appears normal.  Pancreas:  Poorly visualized due to bowel gas.  Spleen:  The patient had a splenectomy but there is a round structure in the left upper quadrant consistent with a splenule. Splenule measures up to 2.4 cm and this was present on the prior examination.  Right Kidney:  Right kidney measures 10.1 cm in length without hydronephrosis.  Left Kidney:  Left kidney is difficult to evaluate and roughly measures 9.6 cm in length without hydronephrosis.  Abdominal aorta: Retroperitoneal structures are difficult to evaluate.  Cannot exclude aneurysmal dilatation of the mid abdominal aorta.  IMPRESSION: There is a 0.9 cm gallstone near the gallbladder neck.  No evidence for biliary dilatation.  Limited evaluation of the abdominal aorta.  There is concern for aneurysmal dilatation in the mid abdominal aorta.  This could be better evaluated with CT.   Original Report Authenticated By: Richarda Overlie, M.D.     Scheduled Meds:   . amLODipine  10 mg Oral Daily  .  ceFAZolin (ANCEF) IV  2 g Intravenous On Call to OR  . chlorhexidine  1 application Topical Once  . chlorhexidine  1 application Topical Once  . diltiazem  360 mg Oral Daily  . influenza  inactive virus vaccine  0.5 mL Intramuscular Tomorrow-1000  . insulin aspart  0-9 Units Subcutaneous Q4H  . levothyroxine  50 mcg Oral Q breakfast  . ondansetron (ZOFRAN) IV  4 mg Intravenous Once  . sodium chloride  1,000 mL Intravenous Once  . sodium chloride  3 mL Intravenous Q12H  . white petrolatum       Continuous Infusions:   . sodium chloride 100 mL/hr (05/13/12  1017)     Stephani Police [facility] Pager [phone]  If 7PM-7AM, please contact night-coverage [email or web address] Password [email or web address] 05/13/2012, 2:54 PM   LOS: 1 day

## 2012-05-13 NOTE — Progress Notes (Signed)
Inpatient Diabetes Program Recommendations  AACE/ADA: New Consensus Statement on Inpatient Glycemic Control (2013)  Target Ranges:  Prepandial:   less than 140 mg/dL      Peak postprandial:   less than 180 mg/dL (1-2 hours)      Critically ill patients:  140 - 180 mg/dL   Results for ALBERTINA, LEISE (MRN [id number]) as of 05/13/2012 11:20  Ref. Range 05/12/2012 19:43 05/13/2012 06:45  Glucose Latest Range: 70-99 mg/dL 409 (H) 811 (H)   Inpatient Diabetes Program Recommendations Insulin - Basal: Patient takes antidiabetic oral agents at home.  May consider starting low dose Lantus daily while in the hospital. Correction (SSI): Please consider starting Novolog correction Q4H sensitive scale.  Note: Will continue to follow. Thanks, Orlando Penner, RN, BSN, CCRN Diabetes Coordinator Inpatient Diabetes Program [phone]

## 2012-05-13 NOTE — Care Management Note (Signed)
    Page 1 of 2   06/02/2012     4:28:04 PM   CARE MANAGEMENT NOTE 06/02/2012  Patient:  Nicole [name], Nicole [name]   Account Number:  [id number]  Date Initiated:  05/13/2012  Documentation initiated by:  Letha Cape  Subjective/Objective Assessment:   dx abd paint  n/v  admit- lives alone.     Action/Plan:   PT eval   Anticipated DC Date:  06/02/2012   Anticipated DC Plan:  HOME W HOME HEALTH SERVICES      DC Planning Services  CM consult      [facility] Choice  HOME HEALTH   Choice offered to / List presented to:  [name]        HH arranged  HH-1 RN  HH-2 PT      [facility] agency  [facility]   Status of service:  Completed, signed off Medicare Important Message given?   (If response is "NO", the following Medicare IM given date fields will be blank) Date Medicare IM given:   Date Additional Medicare IM given:    Discharge Disposition:  HOME W HOME HEALTH SERVICES  Per UR Regulation:  Reviewed for med. necessity/level of care/duration of stay  If discussed at Long Length of Stay Meetings, dates discussed:   05/22/2012  05/27/2012    Comments:  05/30/12 Celia Gibbons,RN,BSN [phone] MET WITH PT TO DISCUSS CHOICE FOR HOME HEALTH CARE. CURRENTLY CONTRACTED AGENCIES WITH PT'S INSURANCE ARE [facility], [facility], AND [facility].  [facility] CANNOT TAKE ADDITIONAL PTS AT THIS TIME.  PT REQUESTS [facility] FOR HOME HEALTH NEEDS.  REFERRAL FAXED TO [facility] AT [phone], AND CONFIRMED RECEIPT.  PT DENIES NEED FOR ANY DME AT THIS TIME, AND DOES NOT QUALIFY FOR HOME OXYGEN.  START OF CARE 24-48H POST DC DATE.  05/26/12- 1130- Donn Pierini RN, BSN [phone] Pt progressing well, NGT out, pt to tx to 2028 today, spoke with pt at bedside regarding d/c plans- pt states that she will have family available to assist at discharge- she is agreeable to [facility] and has used [facility] in past but does not desire to use them again- list of HH agencies for [facility] given to pt to review- PT- recommending HH-PT and RW-   MD please write orders for both [facility] and DME for discharge. NCM to follow.   05/13/12 17:36 Letha Cape RN, BSN [phone] patient lives alone.  NCM will continue to follow for dc needs. PT following - recommending HH-PT with RW- NCM to follow for d/c meeds/ planning.

## 2012-05-13 NOTE — Progress Notes (Signed)
Addendum  Patient seen and examined, chart and data base reviewed.  I agree with the above assessment and plan.  For full details please see Mrs. Algis Downs PA. Note.  Acute Cholecystitis per Gen surgery.   Clint Lipps, MD [facility] Pager: [phone] 05/13/2012, 4:00 PM

## 2012-05-14 ENCOUNTER — Inpatient Hospital Stay (HOSPITAL_COMMUNITY): Payer: Medicare Other | Admitting: Anesthesiology

## 2012-05-14 ENCOUNTER — Ambulatory Visit (INDEPENDENT_AMBULATORY_CARE_PROVIDER_SITE_OTHER): Payer: Self-pay | Admitting: General Surgery

## 2012-05-14 ENCOUNTER — Encounter (HOSPITAL_COMMUNITY): Payer: Self-pay | Admitting: Anesthesiology

## 2012-05-14 ENCOUNTER — Inpatient Hospital Stay (HOSPITAL_COMMUNITY): Payer: Medicare Other

## 2012-05-14 ENCOUNTER — Encounter (HOSPITAL_COMMUNITY)
Admission: EM | Disposition: A | Discharge status: Home Health | Payer: Self-pay | Source: Home / Self Care | Attending: Internal Medicine

## 2012-05-14 DIAGNOSIS — I509 Heart failure, unspecified: Secondary | ICD-10-CM

## 2012-05-14 HISTORY — PX: CHOLECYSTECTOMY: SHX55

## 2012-05-14 LAB — GLUCOSE, CAPILLARY
Glucose-Capillary: 111 mg/dL — ABNORMAL HIGH (ref 70–99)
Glucose-Capillary: 148 mg/dL — ABNORMAL HIGH (ref 70–99)
Glucose-Capillary: 204 mg/dL — ABNORMAL HIGH (ref 70–99)

## 2012-05-14 LAB — CBC
HCT: 36.3 % (ref 36.0–46.0)
Hemoglobin: 12.8 g/dL (ref 12.0–15.0)
MCH: 29.6 pg (ref 26.0–34.0)
MCHC: 35.3 g/dL (ref 30.0–36.0)
RBC: 4.33 MIL/uL (ref 3.87–5.11)

## 2012-05-14 LAB — BASIC METABOLIC PANEL
BUN: 21 mg/dL (ref 6–23)
Creatinine, Ser: 1.34 mg/dL — ABNORMAL HIGH (ref 0.50–1.10)
GFR calc Af Amer: 42 mL/min — ABNORMAL LOW (ref >90)
GFR calc non Af Amer: 37 mL/min — ABNORMAL LOW (ref >90)
Glucose, Bld: 112 mg/dL — ABNORMAL HIGH (ref 70–99)

## 2012-05-14 LAB — MAGNESIUM: Magnesium: 1.9 mg/dL (ref 1.5–2.5)

## 2012-05-14 LAB — SURGICAL PCR SCREEN
MRSA, PCR: NEGATIVE
Staphylococcus aureus: POSITIVE — AB

## 2012-05-14 SURGERY — LAPAROSCOPIC CHOLECYSTECTOMY WITH INTRAOPERATIVE CHOLANGIOGRAM
Anesthesia: General | Site: Abdomen | Wound class: Clean Contaminated

## 2012-05-14 MED ORDER — LACTATED RINGERS IV SOLN
INTRAVENOUS | Status: DC | PRN
  Administered 2012-05-14 (×2): via INTRAVENOUS

## 2012-05-14 MED ORDER — BUPIVACAINE HCL (PF) 0.25 % IJ SOLN
INTRAMUSCULAR | Status: AC
  Filled 2012-05-14: qty 30

## 2012-05-14 MED ORDER — ONDANSETRON HCL 4 MG PO TABS: 4.0000 mg | ORAL_TABLET | Freq: Four times a day (QID) | ORAL | Status: DC | PRN

## 2012-05-14 MED ORDER — LABETALOL HCL 5 MG/ML IV SOLN
INTRAVENOUS | Status: AC
  Filled 2012-05-14: qty 4

## 2012-05-14 MED ORDER — PROPOFOL 10 MG/ML IV BOLUS
INTRAVENOUS | Status: DC | PRN
  Administered 2012-05-14: 130 mg via INTRAVENOUS

## 2012-05-14 MED ORDER — SODIUM CHLORIDE 0.9 % IR SOLN
Status: DC | PRN
  Administered 2012-05-14: 1000 mL

## 2012-05-14 MED ORDER — OXYCODONE HCL 5 MG/5ML PO SOLN: 5.0000 mg | Freq: Once | ORAL | Status: DC | PRN

## 2012-05-14 MED ORDER — LIDOCAINE HCL (CARDIAC) 20 MG/ML IV SOLN
INTRAVENOUS | Status: DC | PRN
  Administered 2012-05-14: 60 mg via INTRAVENOUS

## 2012-05-14 MED ORDER — LACTATED RINGERS IV SOLN
INTRAVENOUS | Status: DC
  Administered 2012-05-14: 14:00:00 via INTRAVENOUS

## 2012-05-14 MED ORDER — CEFAZOLIN SODIUM-DEXTROSE 2-3 GM-% IV SOLR
INTRAVENOUS | Status: AC
  Filled 2012-05-14: qty 50

## 2012-05-14 MED ORDER — OXYCODONE-ACETAMINOPHEN 5-325 MG PO TABS
1.0000 | ORAL_TABLET | ORAL | Status: DC | PRN
  Administered 2012-05-14: 1 via ORAL
  Administered 2012-05-15 (×2): 2 via ORAL
  Filled 2012-05-14 (×2): qty 2
  Filled 2012-05-14: qty 1

## 2012-05-14 MED ORDER — NEOSTIGMINE METHYLSULFATE 1 MG/ML IJ SOLN
INTRAMUSCULAR | Status: DC | PRN
  Administered 2012-05-14: 4 mg via INTRAVENOUS

## 2012-05-14 MED ORDER — KCL IN DEXTROSE-NACL 20-5-0.45 MEQ/L-%-% IV SOLN
INTRAVENOUS | Status: DC
  Administered 2012-05-14 – 2012-05-15 (×2): via INTRAVENOUS
  Filled 2012-05-14 (×4): qty 1000

## 2012-05-14 MED ORDER — OXYCODONE HCL 5 MG PO TABS: 5.0000 mg | ORAL_TABLET | Freq: Once | ORAL | Status: DC | PRN

## 2012-05-14 MED ORDER — SODIUM CHLORIDE 0.9 % IV SOLN
INTRAVENOUS | Status: DC | PRN
  Administered 2012-05-14: 14:00:00

## 2012-05-14 MED ORDER — ROCURONIUM BROMIDE 100 MG/10ML IV SOLN
INTRAVENOUS | Status: DC | PRN
  Administered 2012-05-14: 50 mg via INTRAVENOUS
  Administered 2012-05-14: 10 mg via INTRAVENOUS

## 2012-05-14 MED ORDER — LABETALOL HCL 5 MG/ML IV SOLN
5.0000 mg | INTRAVENOUS | Status: DC | PRN
  Administered 2012-05-14: 5 mg via INTRAVENOUS
  Filled 2012-05-14: qty 4

## 2012-05-14 MED ORDER — BUPIVACAINE HCL (PF) 0.25 % IJ SOLN
INTRAMUSCULAR | Status: DC | PRN
  Administered 2012-05-14: 10 mL

## 2012-05-14 MED ORDER — GLYCOPYRROLATE 0.2 MG/ML IJ SOLN
INTRAMUSCULAR | Status: DC | PRN
  Administered 2012-05-14: 0.6 mg via INTRAVENOUS

## 2012-05-14 MED ORDER — HYDROMORPHONE HCL PF 1 MG/ML IJ SOLN
0.2500 mg | INTRAMUSCULAR | Status: DC | PRN
  Administered 2012-05-14: 0.25 mg via INTRAVENOUS
  Administered 2012-05-14: 0.5 mg via INTRAVENOUS

## 2012-05-14 MED ORDER — SODIUM CHLORIDE 0.45 % IV SOLN
INTRAVENOUS | Status: DC
  Administered 2012-05-14: 10:00:00 via INTRAVENOUS
  Filled 2012-05-14 (×3): qty 1000

## 2012-05-14 MED ORDER — PHENYLEPHRINE HCL 10 MG/ML IJ SOLN
INTRAMUSCULAR | Status: DC | PRN
  Administered 2012-05-14: 40 ug via INTRAVENOUS
  Administered 2012-05-14 (×2): 80 ug via INTRAVENOUS

## 2012-05-14 MED ORDER — HEPARIN SODIUM (PORCINE) 5000 UNIT/ML IJ SOLN
5000.0000 [IU] | Freq: Three times a day (TID) | INTRAMUSCULAR | Status: DC
  Administered 2012-05-15: 5000 [IU] via SUBCUTANEOUS
  Filled 2012-05-14 (×4): qty 1

## 2012-05-14 MED ORDER — ONDANSETRON HCL 4 MG/2ML IJ SOLN
INTRAMUSCULAR | Status: DC | PRN
  Administered 2012-05-14: 4 mg via INTRAVENOUS

## 2012-05-14 MED ORDER — ONDANSETRON HCL 4 MG/2ML IJ SOLN
4.0000 mg | Freq: Four times a day (QID) | INTRAMUSCULAR | Status: DC | PRN
  Administered 2012-05-15 – 2012-05-16 (×3): 4 mg via INTRAVENOUS
  Filled 2012-05-14 (×3): qty 2

## 2012-05-14 MED ORDER — FENTANYL CITRATE 0.05 MG/ML IJ SOLN
INTRAMUSCULAR | Status: DC | PRN
  Administered 2012-05-14: 150 ug via INTRAVENOUS

## 2012-05-14 MED ORDER — HYDROMORPHONE HCL PF 1 MG/ML IJ SOLN
INTRAMUSCULAR | Status: AC
  Filled 2012-05-14: qty 1

## 2012-05-14 MED ORDER — 0.9 % SODIUM CHLORIDE (POUR BTL) OPTIME
TOPICAL | Status: DC | PRN
  Administered 2012-05-14: 1000 mL

## 2012-05-14 MED ORDER — MIDAZOLAM HCL 5 MG/5ML IJ SOLN
INTRAMUSCULAR | Status: DC | PRN
  Administered 2012-05-14: 2 mg via INTRAVENOUS

## 2012-05-14 MED ORDER — ARTIFICIAL TEARS OP OINT
TOPICAL_OINTMENT | OPHTHALMIC | Status: DC | PRN
  Administered 2012-05-14: 1 via OPHTHALMIC

## 2012-05-14 SURGICAL SUPPLY — 51 items
ADH SKN CLS APL DERMABOND .7 (GAUZE/BANDAGES/DRESSINGS) ×1
APPLIER CLIP ROT 10 11.4 M/L (STAPLE) ×2
APR CLP MED LRG 11.4X10 (STAPLE) ×1
BAG SPEC RTRVL LRG 6X4 10 (ENDOMECHANICALS) ×1
BLADE SURG ROTATE 9660 (MISCELLANEOUS) IMPLANT
CANISTER SUCTION 2500CC (MISCELLANEOUS) ×2 IMPLANT
CHLORAPREP W/TINT 26ML (MISCELLANEOUS) ×2 IMPLANT
CLIP APPLIE ROT 10 11.4 M/L (STAPLE) ×1 IMPLANT
CLOTH BEACON ORANGE TIMEOUT ST (SAFETY) ×2 IMPLANT
COVER MAYO STAND STRL (DRAPES) ×2 IMPLANT
COVER SURGICAL LIGHT HANDLE (MISCELLANEOUS) ×2 IMPLANT
DECANTER SPIKE VIAL GLASS SM (MISCELLANEOUS) ×2 IMPLANT
DERMABOND ADVANCED (GAUZE/BANDAGES/DRESSINGS) ×1
DERMABOND ADVANCED .7 DNX12 (GAUZE/BANDAGES/DRESSINGS) ×1 IMPLANT
DRAPE C-ARM 42X72 X-RAY (DRAPES) ×2 IMPLANT
DRAPE UTILITY 15X26 W/TAPE STR (DRAPE) ×4 IMPLANT
ELECT REM PT RETURN 9FT ADLT (ELECTROSURGICAL) ×2
ELECTRODE REM PT RTRN 9FT ADLT (ELECTROSURGICAL) ×1 IMPLANT
FILTER SMOKE EVAC LAPAROSHD (FILTER) ×1 IMPLANT
GLOVE BIO SURGEON STRL SZ7 (GLOVE) ×1 IMPLANT
GLOVE BIOGEL PI IND STRL 6.5 (GLOVE) IMPLANT
GLOVE BIOGEL PI IND STRL 7.0 (GLOVE) IMPLANT
GLOVE BIOGEL PI IND STRL 7.5 (GLOVE) IMPLANT
GLOVE BIOGEL PI INDICATOR 6.5 (GLOVE) ×1
GLOVE BIOGEL PI INDICATOR 7.0 (GLOVE) ×1
GLOVE BIOGEL PI INDICATOR 7.5 (GLOVE) ×2
GLOVE ECLIPSE 7.5 STRL STRAW (GLOVE) ×1 IMPLANT
GLOVE EUDERMIC 7 POWDERFREE (GLOVE) ×2 IMPLANT
GLOVE SURG SS PI 7.0 STRL IVOR (GLOVE) ×1 IMPLANT
GOWN PREVENTION PLUS XLARGE (GOWN DISPOSABLE) ×2 IMPLANT
GOWN STRL NON-REIN LRG LVL3 (GOWN DISPOSABLE) ×6 IMPLANT
KIT BASIN OR (CUSTOM PROCEDURE TRAY) ×2 IMPLANT
KIT ROOM TURNOVER OR (KITS) ×2 IMPLANT
NS IRRIG 1000ML POUR BTL (IV SOLUTION) ×2 IMPLANT
PAD ARMBOARD 7.5X6 YLW CONV (MISCELLANEOUS) ×3 IMPLANT
POUCH SPECIMEN RETRIEVAL 10MM (ENDOMECHANICALS) ×2 IMPLANT
SCISSORS LAP 5X35 DISP (ENDOMECHANICALS) ×2 IMPLANT
SET CHOLANGIOGRAPH 5 50 .035 (SET/KITS/TRAYS/PACK) ×2 IMPLANT
SET IRRIG TUBING LAPAROSCOPIC (IRRIGATION / IRRIGATOR) ×2 IMPLANT
SLEEVE ENDOPATH XCEL 5M (ENDOMECHANICALS) ×2 IMPLANT
SPECIMEN JAR SMALL (MISCELLANEOUS) ×2 IMPLANT
SUT MNCRL AB 4-0 PS2 18 (SUTURE) ×2 IMPLANT
SYR 20ML ECCENTRIC (SYRINGE) ×1 IMPLANT
TOWEL OR 17X24 6PK STRL BLUE (TOWEL DISPOSABLE) ×2 IMPLANT
TOWEL OR 17X26 10 PK STRL BLUE (TOWEL DISPOSABLE) ×2 IMPLANT
TRAY LAPAROSCOPIC (CUSTOM PROCEDURE TRAY) ×2 IMPLANT
TROCAR XCEL BLUNT TIP 100MML (ENDOMECHANICALS) ×2 IMPLANT
TROCAR XCEL NON-BLD 5MMX100MML (ENDOMECHANICALS) ×1 IMPLANT
TROCAR Z-THREAD FIOS 11X100 BL (TROCAR) ×2 IMPLANT
TROCAR Z-THREAD FIOS 5X100MM (TROCAR) ×4 IMPLANT
WATER STERILE IRR 1000ML POUR (IV SOLUTION) IMPLANT

## 2012-05-14 NOTE — Transfer of Care (Signed)
Immediate Anesthesia Transfer of Care Note  Patient: Nicole [name]  Procedure(s) Performed: Procedure(s) (LRB) with comments: LAPAROSCOPIC CHOLECYSTECTOMY WITH INTRAOPERATIVE CHOLANGIOGRAM (N/A)  Patient Location: PACU  Anesthesia Type: General  Level of Consciousness: awake, alert  and oriented  Airway & Oxygen Therapy: Patient Spontanous Breathing and Patient connected to nasal cannula oxygen  Post-op Assessment: Report given to PACU RN, Post -op Vital signs reviewed and stable and Patient moving all extremities X 4  Post vital signs: Reviewed and stable  Complications: No apparent anesthesia complications

## 2012-05-14 NOTE — Progress Notes (Signed)
Full set of dentures and silver ankle bracelet give to pt's son, Marygrace Soos.

## 2012-05-14 NOTE — Progress Notes (Signed)
Abdominal pain, acute, right upper quadrant  Assessment: Abdominal pain, acute, right upper quadrant Symptomatic gallstones  Plan: Lap, possible open chole today. Reviewed plans risks etc with patient and she is ready to proceed   Subjective: Some less pain, no nausea this am  Objective: Vital signs in last 24 hours: Temp:  [98.1 F (36.7 C)-98.9 F (37.2 C)] 98.1 F (36.7 C) (10/23 0530) Pulse Rate:  [81-89] 81  (10/23 0530) Resp:  [18-20] 20  (10/23 0530) BP: (137-178)/(76-100) 137/76 mmHg (10/23 0530) SpO2:  [92 %-94 %] 92 % (10/23 0530) Last BM Date: 05/12/12  Intake/Output from previous day: 10/22 0701 - 10/23 0700 In: -  Out: 1 [Urine:1] Intake/Output this shift:    General appearance: alert, cooperative and no distress Resp: clear to auscultation bilaterally Cardio: regular rate and rhythm, S1, S2 normal, no murmur, click, rub or gallop GI: Still very mild tender in RUQ, otherwise normal abd exam  Lab Results:  Results for orders placed during the hospital encounter of 05/12/12 (from the past 24 hour(s))  GLUCOSE, CAPILLARY     Status: Abnormal   Collection Time   05/13/12  5:30 PM      Component Value Range   Glucose-Capillary 164 (*) 70 - 99 mg/dL   Comment 1 Documented in Chart     Comment 2 Notify RN    GLUCOSE, CAPILLARY     Status: Abnormal   Collection Time   05/13/12  7:59 PM      Component Value Range   Glucose-Capillary 133 (*) 70 - 99 mg/dL  GLUCOSE, CAPILLARY     Status: Abnormal   Collection Time   05/14/12 12:05 AM      Component Value Range   Glucose-Capillary 135 (*) 70 - 99 mg/dL  SURGICAL PCR SCREEN     Status: Abnormal   Collection Time   05/14/12  4:10 AM      Component Value Range   MRSA, PCR NEGATIVE  NEGATIVE   Staphylococcus aureus POSITIVE (*) NEGATIVE  GLUCOSE, CAPILLARY     Status: Abnormal   Collection Time   05/14/12  4:19 AM      Component Value Range   Glucose-Capillary 110 (*) 70 - 99 mg/dL  BASIC METABOLIC  PANEL     Status: Abnormal   Collection Time   05/14/12  5:50 AM      Component Value Range   Sodium 151 (*) 135 - 145 mEq/L   Potassium 3.4 (*) 3.5 - 5.1 mEq/L   Chloride 109  96 - 112 mEq/L   CO2 29  19 - 32 mEq/L   Glucose, Bld 112 (*) 70 - 99 mg/dL   BUN 21  6 - 23 mg/dL   Creatinine, Ser 4.09 (*) 0.50 - 1.10 mg/dL   Calcium 9.0  8.4 - 81.1 mg/dL   GFR calc non Af Amer 37 (*) >90 mL/min   GFR calc Af Amer 42 (*) >90 mL/min  GLUCOSE, CAPILLARY     Status: Abnormal   Collection Time   05/14/12  7:53 AM      Component Value Range   Glucose-Capillary 111 (*) 70 - 99 mg/dL     Studies/Results Radiology     MEDS, Scheduled    . amLODipine  10 mg Oral Daily  .  ceFAZolin (ANCEF) IV  2 g Intravenous On Call to OR  . chlorhexidine  1 application Topical Once  . chlorhexidine  1 application Topical Once  . diltiazem  360 mg  Oral Daily  . influenza  inactive virus vaccine  0.5 mL Intramuscular Tomorrow-1000  . insulin aspart  0-9 Units Subcutaneous Q4H  . levothyroxine  50 mcg Oral Q breakfast  . potassium chloride  40 mEq Oral Once  . sodium chloride  3 mL Intravenous Q12H  . white petrolatum           LOS: 2 days    Currie Paris, MD, [facility], [place] [phone]   05/14/2012 8:48 AM

## 2012-05-14 NOTE — Anesthesia Procedure Notes (Signed)
Procedure Name: Intubation Date/Time: 05/14/2012 2:03 PM Performed by: Elon Alas Pre-anesthesia Checklist: Patient identified, Timeout performed, Emergency Drugs available, Suction available and Patient being monitored Patient Re-evaluated:Patient Re-evaluated prior to inductionOxygen Delivery Method: Circle system utilized Preoxygenation: Pre-oxygenation with 100% oxygen Intubation Type: IV induction Ventilation: Mask ventilation without difficulty and Oral airway inserted - appropriate to patient size Laryngoscope Size: Mac and 3 Grade View: Grade I Tube type: Oral Tube size: 7.0 mm Number of attempts: 1 Airway Equipment and Method: Stylet Placement Confirmation: positive ETCO2,  ETT inserted through vocal cords under direct vision and breath sounds checked- equal and bilateral Secured at: 22 cm Tube secured with: Tape Dental Injury: Teeth and Oropharynx as per pre-operative assessment

## 2012-05-14 NOTE — Anesthesia Preprocedure Evaluation (Signed)
Anesthesia Evaluation  Patient identified by MRN, date of birth, ID band Patient awake    Reviewed: Allergy & Precautions, H&P , NPO status , Patient's Chart, lab work & pertinent test results  Airway Mallampati: II TM Distance: >3 FB Neck ROM: Full    Dental No notable dental hx. (+) Edentulous Upper, Edentulous Lower and Dental Advisory Given   Pulmonary neg pulmonary ROS,  breath sounds clear to auscultation  Pulmonary exam normal       Cardiovascular hypertension, On Medications +CHF + dysrhythmias Atrial Fibrillation Rhythm:Irregular Rate:Normal     Neuro/Psych negative neurological ROS  negative psych ROS   GI/Hepatic negative GI ROS, Neg liver ROS,   Endo/Other  diabetes, Type 2, Oral Hypoglycemic Agents  Renal/GU negative Renal ROS  negative genitourinary   Musculoskeletal   Abdominal   Peds  Hematology negative hematology ROS (+)   Anesthesia Other Findings   Reproductive/Obstetrics negative OB ROS                           Anesthesia Physical Anesthesia Plan  ASA: III  Anesthesia Plan: General   Post-op Pain Management:    Induction: Intravenous  Airway Management Planned: Oral ETT  Additional Equipment:   Intra-op Plan:   Post-operative Plan: Extubation in OR  Informed Consent: I have reviewed the patients History and Physical, chart, labs and discussed the procedure including the risks, benefits and alternatives for the proposed anesthesia with the patient or authorized representative who has indicated his/her understanding and acceptance.   Dental advisory given  Plan Discussed with: CRNA  Anesthesia Plan Comments:         Anesthesia Quick Evaluation

## 2012-05-14 NOTE — Progress Notes (Signed)
[facility] PROGRESS NOTE  Nicole [name] QMV:[id number] DOB: Jan 23, 1931 DOA: 05/12/2012 PCP: Bufford Spikes, DO  Assessment/Plan: Principal Problem:  *Abdominal pain, acute, right upper quadrant Active Problems:  History of colon cancer, stage III  Nausea and vomiting  Dehydration  Cholelithiasis without obstruction  AAA (abdominal aortic aneurysm)  CHF (congestive heart failure)  Atrial fibrillation   Cholecystitis CCS taking patient to OR 10/23. Patient currently NPO U/S shows stone near the gall bladder neck Pain improved with symptomatic treatment.  Nausea, vomiting Secondary to cholecystitis Treating symptomatically - improved.  Atrial Fibrillation Rate controlled Not on coumadin.  Rather on aspirin and plavix (held for surgery) Will need to start lovenox vs Aspirin/plavix asap after surgery-will need to explore more as to why she is not on coumadin  Diabetes Mellitus with Neuropathy Metformin and Glipizide at home SSI - sensitive inpatient.  Will not start insulin at this time as she will be NPO for surgery after midnight.  Mild hypokalemia. Replete in IVF.  Check Mg.  Dehydration Na rose to 151, creatinine rose to 1.34 Changed fluids to 1/2 ns with potassium at 100 ml/hour Monitor closely given history of CHF.  Hypernatremia -change to 0.45 NS-recheck lytes in am  CHF. Asymptomatic, Appears euvolemic.  Patient not on home dose lasix.  Will monitor and be conservative with IVF.   Code Status:  Full code Family Communication: at bedside Disposition Plan: to home when ready  Brief narrative:  78 yo female with 2 -3 days of persistent ruq abd pain with n/v which is worse after eating. No fever. Vomit bilious in nature. No diarrhea. abd feels bloated. Still has gallbladder. Came to ED yesterday and felt to have symptomatic cholelithiasis per imaging yesterday surgery at that time wished for pt to stop her plavix/asa and f/u as outpt to set up  elective chole. However back today due to uncontrolled pain and vomiting despite zofran and pain meds at home.   Consultants:  [facility]  Procedures:  Cholecystectomy 05/14/2012  Antibiotics:  Ancef on call to OR  HPI/Subjective: Complaining of nausea, and itching in her feet which she feels is due to her diabetes.  Objective: Filed Vitals:   05/13/12 1500 05/13/12 2130 05/14/12 0530 05/14/12 0931  BP: 178/94 165/100 137/76 146/83  Pulse: 89 88 81 86  Temp: 98.8 F (37.1 C) 98.9 F (37.2 C) 98.1 F (36.7 C) 99.8 F (37.7 C)  TempSrc: Oral Oral Oral Oral  Resp: 18 20 20 18   Height:      Weight:      SpO2: 94% 94% 92% 92%    Intake/Output Summary (Last 24 hours) at 05/14/12 1025 Last data filed at 05/14/12 0957  Gross per 24 hour  Intake      0 ml  Output      2 ml  Net     -2 ml    Exam:   General:  A&O, NAD  Cardiovascular: RRR, No M/R/G, no Lower Extremity Edema  Respiratory: CTA, No increased work of breathing  Abdomen: Soft, NT, ND, positive bowel sounds, no obvious masses  Neurological:  Cranial Nerves 2 - 12 are grossly intact, exam non focal  Psychiatric: pleasant A&O, Cooperative, well groomed.  Skin:  No rashes, bruises, or lesions  Data Reviewed: Basic Metabolic Panel:  Lab 05/14/12 2841 05/13/12 0645 05/12/12 1943 05/11/12 2352  NA 151* 145 146* 141  K 3.4* 3.4* 3.7 3.6  CL 109 101 101 100  CO2 29 32 31 30  GLUCOSE  112* 169* 187* 187*  BUN 21 18 19 19   CREATININE 1.34* 1.25* 1.23* 1.17*  CALCIUM 9.0 9.6 10.3 9.7  MG -- -- -- --  PHOS -- -- -- --   Liver Function Tests:  Lab 05/13/12 0645 05/12/12 1943 05/11/12 2352  AST 14 18 19   ALT 8 9 9   ALKPHOS 80 90 95  BILITOT 0.4 0.6 0.5  PROT 7.3 7.9 8.0  ALBUMIN 3.6 4.1 4.2   CBC:  Lab 05/13/12 0645 05/12/12 1943 05/11/12 2352  WBC 11.4* 14.4* 10.5  NEUTROABS -- 12.6* 8.5*  HGB 12.9 14.2 14.3  HCT 36.8 39.4 39.1  MCV 83.6 82.8 82.0  PLT 290 291 302   CBG:  Lab 05/14/12 0753  05/14/12 0419 05/14/12 0005 05/13/12 1959 05/13/12 1730  GLUCAP 111* 110* 135* 133* 164*    Recent Results (from the past 240 hour(s))  SURGICAL PCR SCREEN     Status: Abnormal   Collection Time   05/14/12  4:10 AM      Component Value Range Status Comment   MRSA, PCR NEGATIVE  NEGATIVE Final    Staphylococcus aureus POSITIVE (*) NEGATIVE Final      Studies: US Abdomen Complete  05/12/2012  *RADIOLOGY REPORT*  Clinical Data:  Upper abdominal pain.  Concern for gallstones. History of splenectomy.  COMPLETE ABDOMINAL ULTRASOUND  Comparison:  CT 11/08/2006  Findings:  Gallbladder:  There is a 0.9 cm stone near the gallbladder neck. There is no evidence for gallbladder wall thickening.  The patient does not have a sonographic Murphy's sign.  Common bile duct:  Measures 0.5 cm.  Liver:  The liver is echogenic and slightly heterogeneous.  IVC:  Appears normal.  Pancreas:  Poorly visualized due to bowel gas.  Spleen:  The patient had a splenectomy but there is a round structure in the left upper quadrant consistent with a splenule. Splenule measures up to 2.4 cm and this was present on the prior examination.  Right Kidney:  Right kidney measures 10.1 cm in length without hydronephrosis.  Left Kidney:  Left kidney is difficult to evaluate and roughly measures 9.6 cm in length without hydronephrosis.  Abdominal aorta: Retroperitoneal structures are difficult to evaluate.  Cannot exclude aneurysmal dilatation of the mid abdominal aorta.  IMPRESSION: There is a 0.9 cm gallstone near the gallbladder neck.  No evidence for biliary dilatation.  Limited evaluation of the abdominal aorta.  There is concern for aneurysmal dilatation in the mid abdominal aorta.  This could be better evaluated with CT.   Original Report Authenticated By: Richarda Overlie, M.D.     Scheduled Meds:    . amLODipine  10 mg Oral Daily  .  ceFAZolin (ANCEF) IV  2 g Intravenous On Call to OR  . chlorhexidine  1 application Topical Once  .  chlorhexidine  1 application Topical Once  . diltiazem  360 mg Oral Daily  . influenza  inactive virus vaccine  0.5 mL Intramuscular Tomorrow-1000  . insulin aspart  0-9 Units Subcutaneous Q4H  . levothyroxine  50 mcg Oral Q breakfast  . potassium chloride  40 mEq Oral Once  . sodium chloride  3 mL Intravenous Q12H  . white petrolatum       Continuous Infusions:    . sodium chloride 100 mL/hr (05/13/12 1017)  . sodium chloride 0.45 % 1,000 mL with potassium chloride 40 mEq infusion 100 mL/hr at 05/14/12 1017  . DISCONTD: sodium chloride 50 mL/hr at 05/14/12 0020  Stephani Police [facility] Pager [phone]  If 7PM-7AM, please contact night-coverage [email or web address] Password [email or web address] 05/14/2012, 10:25 AM   LOS: 2 days   Attending I have seen and examined the patient, I agree with the assessment and plan as outlined above. Mild hypernatremia-change IVF to 0.45 NS, recheck lytes in am. Will need more collaborative history-as to why she is not on coumadin for a Afib, she does have a high CHADS2 score. Will follow up post-op  Windell Norfolk MD

## 2012-05-14 NOTE — Progress Notes (Signed)
Physical Therapy Cancel Note   Orders received, chart reviewed  Pt currently in OR for acute cholecystitis  Will follow up for PT eval tomorrow;   Van Clines, PT [phone]

## 2012-05-14 NOTE — Preoperative (Signed)
Beta Blockers   Reason not to administer Beta Blockers:Not Applicable 

## 2012-05-14 NOTE — Op Note (Signed)
ZOHAR LAING 1930/10/10 [id number] 05/12/2012  Preoperative diagnosis: chronic calculus cholecystitis  Postoperative diagnosis: same  Procedure: laparoscopic cholecystectomy with intraoperative cholangiogram  Surgeon: Currie Paris, MD, FACS  Assistant surgeon: Dr. Manus Rudd   Anesthesia: General  Clinical History and Indications: This patient has known gallstones and comes in today for cholecystectomy.  Description of procedure: The patient was seen in the preoperative area. I reviewed the plans for the procedure with her as well as the risks and complications. She had no further questions and wished to proceed.  The patient was taken to the operating room. After satisfactory general endotracheal anesthesia had been obtained the abdomen was prepped and draped. A time out was done.  0.25% plain Marcaine was used at all incisions.severe long midline incision. Therefore entered the abdomen in the right upper quadrant. A small incision and a 5 mm trocar with the camera. Aggression is able to entered the abdomen and once entering it was able to insufflated and the 5 mm camera in. There multiple midline adhesions we could see the liver and the gallbladder. There was a loop of small bowel between my entry site in the umbilicus but is able to go inferior and see the umbilical area. There is a single loop of small bowel attached there but just with a flimsy lesion. Under direct vision a 5 mm port near the umbilicus. The camera nap or does able to put a second 5 mm port laterally. I then placed a camera in one of the 2 ports and took down the remaining adhesion around the bowel near the umbilical port submitted me into that bowel while manipulating the port.  Was then able to free up some loop of small bowel and omentum that was stuck to the falciform to obtain an entry site for the epigastric trocar. I was been ableto place a 1011 trocar under direct vision.  The gallbladder was then  retracted over the liver I opened the perineum around the cystic duct and dissected a long segment of cystic duct and cystic artery and obtained a critical view. A Cook catheter was placed percutaneously and threaded into the cystic duct an operative angiography done.    An intraoperative cholangiogram was then performed. A Cook catheter was introduced percutaneously and placed in the cystic duct. The cholangiogram showed good filling of the common duct and hepatic radicals and free flow into the duodenum. No abnormalities were noted.  The catheter was removed and 3 clips placed on the stay side of the cystic duct. The duct was then divided.  Additional clips are placed on the cystic artery and it was divided. The gallbladder was then removed from below to above the coagulation current of the cautery. It was then placed in a bag to be retrieved later.  The abdomen was irrigated and a check for hemostasis along the bed of the gallbladder made. The gallbladder was then removed through the epigastric port. The abdomen was reinsufflated and a final check for hemostasis made. There is no evidence of bleeding or bile leakage. The remaining ports were removed. Skin was closed with 4-0 Monocryl subcuticular and Dermabond.  The patient tolerated the procedure well. There were no operative complications. EBL was minimal. All counts were correct.  Currie Paris, MD, FACS 05/14/2012 3:06 PM

## 2012-05-15 ENCOUNTER — Inpatient Hospital Stay (HOSPITAL_COMMUNITY): Payer: Medicare Other

## 2012-05-15 ENCOUNTER — Encounter (HOSPITAL_COMMUNITY): Payer: Self-pay | Admitting: Surgery

## 2012-05-15 DIAGNOSIS — I10 Essential (primary) hypertension: Secondary | ICD-10-CM | POA: Diagnosis present

## 2012-05-15 DIAGNOSIS — E1149 Type 2 diabetes mellitus with other diabetic neurological complication: Secondary | ICD-10-CM | POA: Diagnosis present

## 2012-05-15 DIAGNOSIS — R0603 Acute respiratory distress: Secondary | ICD-10-CM | POA: Diagnosis not present

## 2012-05-15 DIAGNOSIS — N179 Acute kidney failure, unspecified: Secondary | ICD-10-CM | POA: Insufficient documentation

## 2012-05-15 DIAGNOSIS — N189 Chronic kidney disease, unspecified: Secondary | ICD-10-CM | POA: Diagnosis not present

## 2012-05-15 DIAGNOSIS — IMO0001 Reserved for inherently not codable concepts without codable children: Secondary | ICD-10-CM

## 2012-05-15 DIAGNOSIS — N289 Disorder of kidney and ureter, unspecified: Secondary | ICD-10-CM

## 2012-05-15 LAB — CBC
HCT: 32 % — ABNORMAL LOW (ref 36.0–46.0)
Hemoglobin: 10.9 g/dL — ABNORMAL LOW (ref 12.0–15.0)
MCH: 28.6 pg (ref 26.0–34.0)
MCH: 29 pg (ref 26.0–34.0)
MCHC: 34.1 g/dL (ref 30.0–36.0)
MCHC: 34.4 g/dL (ref 30.0–36.0)
MCV: 84.3 fL (ref 78.0–100.0)
Platelets: 252 10*3/uL (ref 150–400)
RBC: 3.81 MIL/uL — ABNORMAL LOW (ref 3.87–5.11)
RDW: 15.5 % (ref 11.5–15.5)

## 2012-05-15 LAB — BASIC METABOLIC PANEL
BUN: 21 mg/dL (ref 6–23)
BUN: 24 mg/dL — ABNORMAL HIGH (ref 6–23)
Calcium: 8.3 mg/dL — ABNORMAL LOW (ref 8.4–10.5)
Chloride: 102 mEq/L (ref 96–112)
Creatinine, Ser: 2.81 mg/dL — ABNORMAL HIGH (ref 0.50–1.10)
GFR calc Af Amer: 22 mL/min — ABNORMAL LOW (ref >90)
GFR calc Af Amer: 17 mL/min — ABNORMAL LOW (ref >90)
GFR calc non Af Amer: 19 mL/min — ABNORMAL LOW (ref >90)
GFR calc non Af Amer: 15 mL/min — ABNORMAL LOW (ref >90)
Glucose, Bld: 158 mg/dL — ABNORMAL HIGH (ref 70–99)
Potassium: 4.2 mEq/L (ref 3.5–5.1)
Sodium: 140 mEq/L (ref 135–145)

## 2012-05-15 LAB — GLUCOSE, CAPILLARY
Glucose-Capillary: 158 mg/dL — ABNORMAL HIGH (ref 70–99)
Glucose-Capillary: 162 mg/dL — ABNORMAL HIGH (ref 70–99)
Glucose-Capillary: 199 mg/dL — ABNORMAL HIGH (ref 70–99)

## 2012-05-15 LAB — PRO B NATRIURETIC PEPTIDE: Pro B Natriuretic peptide (BNP): 2820 pg/mL — ABNORMAL HIGH (ref 0–450)

## 2012-05-15 LAB — HEMOGLOBIN AND HEMATOCRIT, BLOOD: Hemoglobin: 11.6 g/dL — ABNORMAL LOW (ref 12.0–15.0)

## 2012-05-15 MED ORDER — SODIUM CHLORIDE 0.9 % IV SOLN
INTRAVENOUS | Status: DC
  Administered 2012-05-15: 21:00:00 via INTRAVENOUS
  Administered 2012-05-15: 75 mL/h via INTRAVENOUS
  Administered 2012-05-16: 01:00:00 via INTRAVENOUS

## 2012-05-15 MED ORDER — DILTIAZEM HCL ER COATED BEADS 360 MG PO CP24
360.0000 mg | ORAL_CAPSULE | Freq: Every day | ORAL | Status: DC
  Filled 2012-05-15: qty 1

## 2012-05-15 MED ORDER — OXYCODONE-ACETAMINOPHEN 5-325 MG PO TABS
1.0000 | ORAL_TABLET | Freq: Four times a day (QID) | ORAL | Status: DC | PRN
  Administered 2012-05-16: 1 via ORAL
  Filled 2012-05-15: qty 1

## 2012-05-15 NOTE — Consult Note (Signed)
Pt. Seen and examined. Agree with the NP/PA-C note as written.   Pleasant 78 yo female with a history of chronic a-fib and recent laprascopic cholecystectomy yesterday. We are asked to see her for a-fib with periods of RVR. I suspect the RVR is due to pain and possibly dehydration. Her creatinine is climbing. Physical exam reveals decreased breath sounds and probable atelectasis at the bases. Her abdomen is typanitic to percussion with decreased breath sounds. There is mild TTP but no rebound or guarding. I discussed the case with Dr. Jerral Ralph and will order a flat plate/Free air series. It may be worthwhile to have surgery re-evaluate based on the x-ray findings.  I don't see a clear indication for lasix at this time. Will follow-up on labs.  Chrystie Nose, MD, [facility] Attending Cardiologist [facility]

## 2012-05-15 NOTE — Progress Notes (Signed)
1 Day Post-Op   Assessment: s/p Procedure(s): LAPAROSCOPIC CHOLECYSTECTOMY WITH INTRAOPERATIVE CHOLANGIOGRAM Patient Active Problem List  Diagnosis  . Anemia  . History of colon cancer, stage III  . Abdominal pain, acute, right upper quadrant  . Nausea and vomiting  . Dehydration  . Cholelithiasis without obstruction  . AAA (abdominal aortic aneurysm)  . CHF (congestive heart failure)  . Atrial fibrillation    Hgb down a tad and somewhat orthostatic, creat up - may be equilibration, but need to be sure patient did not have some post op bleed. Otherwise appears stable Plan: Advance diet Follow HGB, may need more fluids  Subjective: Feels ok, no nausea, taking clear liquids, feels somewhat weak. Up in chair  Objective: Vital signs in last 24 hours: Temp:  [96.9 F (36.1 C)-99.8 F (37.7 C)] 97.9 F (36.6 C) (10/24 0430) Pulse Rate:  [80-123] 114  (10/24 0430) Resp:  [18-31] 20  (10/24 0430) BP: (89-180)/(49-127) 89/49 mmHg (10/24 0754) SpO2:  [89 %-98 %] 98 % (10/24 0430)   Intake/Output from previous day: 10/23 0701 - 10/24 0700 In: 1660 [P.O.:360; I.V.:1300] Out: 26 [Urine:1; Blood:25] Intake/Output this shift:     General appearance: alert, cooperative and no distress Resp: clear to auscultation bilaterally GI: soft, mild tender c/w post op. BS+  Incision: healing well  Lab Results:   Basename 05/15/12 0610 05/14/12 1739  WBC 8.5 9.5  HGB 10.9* 12.8  HCT 32.0* 36.3  PLT 254 263   BMET  Basename 05/15/12 0610 05/14/12 1739 05/14/12 0550  NA 140 -- 151*  K 4.2 -- 3.4*  CL 102 -- 109  CO2 26 -- 29  GLUCOSE 158* -- 112*  BUN 21 -- 21  CREATININE 2.33* 1.08 --  CALCIUM 8.2* -- 9.0   PT/INR  Basename 05/13/12 0645  LABPROT 14.2  INR 1.11   ABG No results found for this basename: PHART:2,PCO2:2,PO2:2,HCO3:2 in the last 72 hours  MEDS, Scheduled    . amLODipine  10 mg Oral Daily  .  ceFAZolin (ANCEF) IV  2 g Intravenous On Call to OR  .  diltiazem  360 mg Oral Daily  . HYDROmorphone      . influenza  inactive virus vaccine  0.5 mL Intramuscular Tomorrow-1000  . insulin aspart  0-9 Units Subcutaneous Q4H  . labetalol      . levothyroxine  50 mcg Oral Q breakfast  . sodium chloride  3 mL Intravenous Q12H  . DISCONTD: chlorhexidine  1 application Topical Once  . DISCONTD: heparin  5,000 Units Subcutaneous Q8H    Studies/Results: Dg Cholangiogram Operative  05/14/2012  *RADIOLOGY REPORT*  Clinical Data: Laparoscopic cholecystectomy  INTRAOPERATIVE CHOLANGIOGRAM  Comparison:  Abdominal ultrasound - 05/12/2012  Findings:  Intraoperative angiographic images of the right upper abdominal quadrant during laparoscopic cholecystectomy are provided for review.  Surgical clips overlie the expected location of the gallbladder fossa.  Contrast injection demonstrates selective cannulation of the central aspect of the cystic duct.  There is brisk passage of contrast through the central aspect of the cystic duct with filling of a mildly dilated common bile duct. There is brisk passage of contrast though the CBD and into the descending portion of the duodenum.  There is minimal reflux of injected contrast into the common hepatic duct and central aspect of the nondilated intrahepatic biliary system.  There are no discrete filling defects within the opacified portions of the biliary system to suggest the presence of choledocholithiasis.  IMPRESSION:  Intraoperative cholangiogram as above.  No discrete filling defects to suggest the presence of choledocholithiasis.   Original Report Authenticated By: Waynard Reeds, M.D.       LOS: 3 days     Currie Paris, MD, [facility], [place] [phone]   05/15/2012 8:10 AM

## 2012-05-15 NOTE — Consult Note (Signed)
Reason for Consult: SOB  Requesting Physician: [facility]  HPI: This is a 78 y.o. female with a past medical history significant for CAF. She is followed in our office, a former pt of Dr Fredirick Maudlin. She has CAF. She has not been felt to be a candidate for Coumadin because of compliance issues. She has had a NICM in the past with an EF as low as 30% in the past. Her coronaries were normal in 2008. Her most recent EF was 45-50% by echo March 2011. She is admitted now with acute on chronic cholecystitis. She was felt to be dehydrated on admission and has been hydrated with IV NS. She underwent a laparoscopic cholecystectomy 05/14/13. Today she is SOB in the chair with O2 sats in the 80s. Interestingly with hydration her Scr actually got worse and is up to 2.33 from 1.23. CXR and BNP are pending.   PMHx:  Past Medical History  Diagnosis Date  . Colon cancer 07/1997  . Hypertension   . Diabetes mellitus   . CHF (congestive heart failure)   . AAA (abdominal aortic aneurysm)   . Atrial fibrillation   . Hypercholesteremia   . Kidney stone     renal calculi   Past Surgical History  Procedure Date  . Hemicolectomy 08/11/1997  . Kidney stone surgery   . Colon surgery     to remove colon ca    FAMHx: History reviewed. No pertinent family history.  SOCHx:  reports that she has never smoked. She has never used smokeless tobacco. She reports that she does not drink alcohol or use illicit drugs.  ALLERGIES: Allergies  Allergen Reactions  . Iohexol      Code: VOM, Desc: PT REPORTS VOMITING W/ IVP DYE- ARS 12/26/08, Onset Date: [id number]     ROS: Pertinent items are noted in HPI.  HOME MEDICATIONS: Prescriptions prior to admission  Medication Sig Dispense Refill  . allopurinol (ZYLOPRIM) 100 MG tablet Take 100 mg by mouth as needed. For gout      . amLODipine (NORVASC) 10 MG tablet Take 10 mg by mouth daily.        Marland Kitchen atorvastatin (LIPITOR) 40 MG tablet Take 40 mg by mouth daily.          Marland Kitchen diltiazem (CARDIZEM CD) 360 MG 24 hr capsule Take 360 mg by mouth daily.        . furosemide (LASIX) 40 MG tablet Take 40 mg by mouth daily.      Marland Kitchen glipiZIDE (GLUCOTROL) 10 MG tablet Take 10 mg by mouth daily.        Marland Kitchen levothyroxine (SYNTHROID, LEVOTHROID) 50 MCG tablet Take 50 mcg by mouth daily.        . magnesium oxide (MAG-OX) 400 MG tablet Take 400 mg by mouth daily.      . metFORMIN (GLUCOPHAGE) 500 MG tablet Take 500 mg by mouth 2 (two) times daily with a meal.        . potassium chloride (K-DUR) 10 MEQ tablet Take 10 mEq by mouth daily.          HOSPITAL MEDICATIONS: I have reviewed the patient's current medications.  VITALS: Blood pressure 106/60, pulse 98, temperature 97.8 F (36.6 C), temperature source Oral, resp. rate 24, height 5\' 11"  (1.803 m), weight 80 kg (176 lb 5.9 oz), SpO2 94.00%.  PHYSICAL EXAM: General appearance: alert, cooperative, mild distress and morbidly obese Neck: no carotid bruit and no JVD Lungs: decreased breath sounds at both bases Lt >  Rt with few basilar rales Heart: irregularly irregular rhythm Abdomen: obese, distended, staples in place Extremities: no edema Pulses: 2+ and symmetric Skin: pale, cool, and dry Neurologic: Grossly normal  LABS: Results for orders placed during the hospital encounter of 05/12/12 (from the past 48 hour(s))  GLUCOSE, CAPILLARY     Status: Abnormal   Collection Time   05/13/12  5:30 PM      Component Value Range Comment   Glucose-Capillary 164 (*) 70 - 99 mg/dL    Comment 1 Documented in Chart      Comment 2 Notify RN     GLUCOSE, CAPILLARY     Status: Abnormal   Collection Time   05/13/12  7:59 PM      Component Value Range Comment   Glucose-Capillary 133 (*) 70 - 99 mg/dL   GLUCOSE, CAPILLARY     Status: Abnormal   Collection Time   05/14/12 12:05 AM      Component Value Range Comment   Glucose-Capillary 135 (*) 70 - 99 mg/dL   SURGICAL PCR SCREEN     Status: Abnormal   Collection Time   05/14/12   4:10 AM      Component Value Range Comment   MRSA, PCR NEGATIVE  NEGATIVE    Staphylococcus aureus POSITIVE (*) NEGATIVE   GLUCOSE, CAPILLARY     Status: Abnormal   Collection Time   05/14/12  4:19 AM      Component Value Range Comment   Glucose-Capillary 110 (*) 70 - 99 mg/dL   BASIC METABOLIC PANEL     Status: Abnormal   Collection Time   05/14/12  5:50 AM      Component Value Range Comment   Sodium 151 (*) 135 - 145 mEq/L    Potassium 3.4 (*) 3.5 - 5.1 mEq/L    Chloride 109  96 - 112 mEq/L    CO2 29  19 - 32 mEq/L    Glucose, Bld 112 (*) 70 - 99 mg/dL    BUN 21  6 - 23 mg/dL    Creatinine, Ser 0.45 (*) 0.50 - 1.10 mg/dL    Calcium 9.0  8.4 - 40.9 mg/dL    GFR calc non Af Amer 37 (*) >90 mL/min    GFR calc Af Amer 42 (*) >90 mL/min   MAGNESIUM     Status: Normal   Collection Time   05/14/12  5:50 AM      Component Value Range Comment   Magnesium 1.9  1.5 - 2.5 mg/dL   GLUCOSE, CAPILLARY     Status: Abnormal   Collection Time   05/14/12  7:53 AM      Component Value Range Comment   Glucose-Capillary 111 (*) 70 - 99 mg/dL   GLUCOSE, CAPILLARY     Status: Abnormal   Collection Time   05/14/12 11:43 AM      Component Value Range Comment   Glucose-Capillary 122 (*) 70 - 99 mg/dL   GLUCOSE, CAPILLARY     Status: Abnormal   Collection Time   05/14/12  3:24 PM      Component Value Range Comment   Glucose-Capillary 123 (*) 70 - 99 mg/dL    Comment 1 Notify RN     GLUCOSE, CAPILLARY     Status: Abnormal   Collection Time   05/14/12  4:59 PM      Component Value Range Comment   Glucose-Capillary 148 (*) 70 - 99 mg/dL   CBC  Status: Abnormal   Collection Time   05/14/12  5:39 PM      Component Value Range Comment   WBC 9.5  4.0 - 10.5 K/uL    RBC 4.33  3.87 - 5.11 MIL/uL    Hemoglobin 12.8  12.0 - 15.0 g/dL    HCT 40.9  81.1 - 91.4 %    MCV 83.8  78.0 - 100.0 fL    MCH 29.6  26.0 - 34.0 pg    MCHC 35.3  30.0 - 36.0 g/dL    RDW 78.2 (*) 95.6 - 15.5 %    Platelets  263  150 - 400 K/uL   CREATININE, SERUM     Status: Abnormal   Collection Time   05/14/12  5:39 PM      Component Value Range Comment   Creatinine, Ser 1.08  0.50 - 1.10 mg/dL    GFR calc non Af Amer 48 (*) >90 mL/min    GFR calc Af Amer 55 (*) >90 mL/min   GLUCOSE, CAPILLARY     Status: Abnormal   Collection Time   05/14/12  8:27 PM      Component Value Range Comment   Glucose-Capillary 204 (*) 70 - 99 mg/dL    Comment 1 Documented in Chart      Comment 2 Notify RN     GLUCOSE, CAPILLARY     Status: Abnormal   Collection Time   05/15/12 12:13 AM      Component Value Range Comment   Glucose-Capillary 199 (*) 70 - 99 mg/dL    Comment 1 Documented in Chart      Comment 2 Notify RN     GLUCOSE, CAPILLARY     Status: Abnormal   Collection Time   05/15/12  4:28 AM      Component Value Range Comment   Glucose-Capillary 162 (*) 70 - 99 mg/dL    Comment 1 Documented in Chart      Comment 2 Notify RN     BASIC METABOLIC PANEL     Status: Abnormal   Collection Time   05/15/12  6:10 AM      Component Value Range Comment   Sodium 140  135 - 145 mEq/L    Potassium 4.2  3.5 - 5.1 mEq/L    Chloride 102  96 - 112 mEq/L    CO2 26  19 - 32 mEq/L    Glucose, Bld 158 (*) 70 - 99 mg/dL    BUN 21  6 - 23 mg/dL    Creatinine, Ser 2.13 (*) 0.50 - 1.10 mg/dL DELTA CHECK NOTED   Calcium 8.2 (*) 8.4 - 10.5 mg/dL    GFR calc non Af Amer 19 (*) >90 mL/min    GFR calc Af Amer 22 (*) >90 mL/min   CBC     Status: Abnormal   Collection Time   05/15/12  6:10 AM      Component Value Range Comment   WBC 8.5  4.0 - 10.5 K/uL    RBC 3.81 (*) 3.87 - 5.11 MIL/uL    Hemoglobin 10.9 (*) 12.0 - 15.0 g/dL    HCT 08.6 (*) 57.8 - 46.0 %    MCV 84.0  78.0 - 100.0 fL    MCH 28.6  26.0 - 34.0 pg    MCHC 34.1  30.0 - 36.0 g/dL    RDW 46.9 (*) 62.9 - 15.5 %    Platelets 254  150 - 400 K/uL   GLUCOSE, CAPILLARY  Status: Abnormal   Collection Time   05/15/12  7:55 AM      Component Value Range Comment    Glucose-Capillary 151 (*) 70 - 99 mg/dL     IMAGING: Dg Cholangiogram Operative  05/14/2012  *RADIOLOGY REPORT*  Clinical Data: Laparoscopic cholecystectomy  INTRAOPERATIVE CHOLANGIOGRAM  Comparison:  Abdominal ultrasound - 05/12/2012  Findings:  Intraoperative angiographic images of the right upper abdominal quadrant during laparoscopic cholecystectomy are provided for review.  Surgical clips overlie the expected location of the gallbladder fossa.  Contrast injection demonstrates selective cannulation of the central aspect of the cystic duct.  There is brisk passage of contrast through the central aspect of the cystic duct with filling of a mildly dilated common bile duct. There is brisk passage of contrast though the CBD and into the descending portion of the duodenum.  There is minimal reflux of injected contrast into the common hepatic duct and central aspect of the nondilated intrahepatic biliary system.  There are no discrete filling defects within the opacified portions of the biliary system to suggest the presence of choledocholithiasis.  IMPRESSION:  Intraoperative cholangiogram as above.  No discrete filling defects to suggest the presence of choledocholithiasis.   Original Report Authenticated By: Waynard Reeds, M.D.     IMPRESSION: Principal Problem:  *Respiratory distress Active Problems:  Cholecystitis chronic, acute, S/P lap cholecystectomy 05/14/13  CHF, past NICM with an EF of 30%, most recent echo 2011 showed EF to be45- 50%  Chronic atrial fibrillation, not felt to be a Coumadin candidate in the past  Acute on chronic renal insufficiency  History of colon cancer, stage III  Thoracic aortic aneurysm, 4.8cm 2011  Normal coronary arteries, 2008  Diabetes mellitus  HTN (hypertension)   RECOMMENDATION: Stop IVF for now, follow up CXR and BNP. Will hold off on IV Lasix till these are reviewed. MD to see  Time Spent Directly with Patient: 40 minutes  Daunte Oestreich  K 05/15/2012, 11:25 AM

## 2012-05-15 NOTE — Progress Notes (Signed)
[facility] PROGRESS NOTE  Nicole [name] AVW:[id number] DOB: 05/06/1931 DOA: 05/12/2012 PCP: Bufford Spikes, DO  Assessment/Plan: Principal Problem:  *Abdominal pain, acute, right upper quadrant Active Problems:  History of colon cancer, stage III  Nausea and vomiting  Dehydration  Cholelithiasis without obstruction  AAA (abdominal aortic aneurysm)  CHF (congestive heart failure)  Atrial fibrillation   Cholecystitis S/P cholecystectomy 10/23.  IOC with no filling defects. Patient's WBC normal and she is afebrile.  Not on antibiotics.  ARF Uncertain etiology.  Baseline creatinine is approximately 1.2 - 1.4 Avoid nephrotoxic meds Volume status seems ok-CXR acutally showing mild congestion  Post op Anemia Patient was on aspirin and plavix at home.  This was held prior to surgery for several days. Hgb has dropped 2 gms over night.  Will check CBC at 12:00 pm today. Will be alert for post op bleeding.  Hypoxia with mild tachypnea (10/24) 86% at rest on room air.  CHF? Now on 2 L of oxygen. Likely secondary to pain medications and not inspiring deeply secondary to pain. Will order portable CXR, Continuous Pulse ox., and incentive spirometry.  Orthostatic Hypotension Re-check orthostatics in am  Nausea, vomiting Secondary to cholecystitis - resolved.  Atrial Fibrillation Not on coumadin. (non-compliant). Rather on aspirin and plavix (held for surgery). Patient was on heparin - however this is currently discontinued as the patient's hgb has dropped and we want to avoid bleeding. Have asked [facility] to see her for afib/aflutter, CHF post op.  Diabetes Mellitus with Neuropathy Metformin and Glipizide at home SSI - sensitive inpatient.  Will saline lock IVF and avoid D5.   Diet being advanced to full liquids.  Mild hypokalemia. Resolved  Dehydration Sodium now normalized after IVF hydration  Hypernatremia Resolved.  CHF. Called [facility] to see.  CXR 10/24 not  revealing. Will d/c IV and follow [facility] recommendations regarding management.   Code Status:  Full code Family Communication: at bedside Disposition Plan: to home when ready  Brief narrative:  78 yo female with 2 -3 days of persistent ruq abd pain with n/v which is worse after eating. No fever. Vomit bilious in nature. No diarrhea. abd feels bloated. Still has gallbladder. Came to ED yesterday and felt to have symptomatic cholelithiasis per imaging yesterday surgery at that time wished for pt to stop her plavix/asa and f/u as outpt to set up elective chole. However back today due to uncontrolled pain and vomiting despite zofran and pain meds at home.   Consultants:  [facility]  [facility]  Procedures:  Cholecystectomy 05/14/2012  Antibiotics:  Ancef on call to OR  HPI/Subjective: C/O generally feeling poorly and right shoulder pain.  Objective: Filed Vitals:   05/15/12 0754 05/15/12 0953 05/15/12 1050 05/15/12 1053  BP: 80/49 106/60    Pulse:      Temp:      TempSrc:      Resp:      Height:      Weight:      SpO2:   89% 94%    Intake/Output Summary (Last 24 hours) at 05/15/12 1057 Last data filed at 05/15/12 1001  Gross per 24 hour  Intake   1783 ml  Output     25 ml  Net   1758 ml    Exam:   General:  A&O, NAD  Cardiovascular: RRR, No M/R/G, no Lower Extremity Edema  Respiratory: CTA, No increased work of breathing  Abdomen: Soft, NT, ND, positive bowel sounds, no obvious masses  Neurological:  Cranial  Nerves 2 - 12 are grossly intact, exam non focal  Psychiatric: pleasant A&O, Cooperative, well groomed.  Skin:  No rashes, bruises, or lesions  Data Reviewed: Basic Metabolic Panel:  Lab 05/15/12 8657 05/14/12 1739 05/14/12 0550 05/13/12 0645 05/12/12 1943 05/11/12 2352  NA 140 -- 151* 145 146* 141  K 4.2 -- 3.4* 3.4* 3.7 3.6  CL 102 -- 109 101 101 100  CO2 26 -- 29 32 31 30  GLUCOSE 158* -- 112* 169* 187* 187*  BUN 21 -- 21 18 19 19   CREATININE 2.33*  1.08 1.34* 1.25* 1.23* --  CALCIUM 8.2* -- 9.0 9.6 10.3 9.7  MG -- -- 1.9 -- -- --  PHOS -- -- -- -- -- --   Liver Function Tests:  Lab 05/13/12 0645 05/12/12 1943 05/11/12 2352  AST 14 18 19   ALT 8 9 9   ALKPHOS 80 90 95  BILITOT 0.4 0.6 0.5  PROT 7.3 7.9 8.0  ALBUMIN 3.6 4.1 4.2   CBC:  Lab 05/15/12 0610 05/14/12 1739 05/13/12 0645 05/12/12 1943 05/11/12 2352  WBC 8.5 9.5 11.4* 14.4* 10.5  NEUTROABS -- -- -- 12.6* 8.5*  HGB 10.9* 12.8 12.9 14.2 14.3  HCT 32.0* 36.3 36.8 39.4 39.1  MCV 84.0 83.8 83.6 82.8 82.0  PLT 254 263 290 291 302   CBG:  Lab 05/15/12 0755 05/15/12 0428 05/15/12 0013 05/14/12 2027 05/14/12 1659  GLUCAP 151* 162* 199* 204* 148*    Recent Results (from the past 240 hour(s))  SURGICAL PCR SCREEN     Status: Abnormal   Collection Time   05/14/12  4:10 AM      Component Value Range Status Comment   MRSA, PCR NEGATIVE  NEGATIVE Final    Staphylococcus aureus POSITIVE (*) NEGATIVE Final      Studies: US Abdomen Complete  05/12/2012  *RADIOLOGY REPORT*  Clinical Data:  Upper abdominal pain.  Concern for gallstones. History of splenectomy.  COMPLETE ABDOMINAL ULTRASOUND  Comparison:  CT 11/08/2006  Findings:  Gallbladder:  There is a 0.9 cm stone near the gallbladder neck. There is no evidence for gallbladder wall thickening.  The patient does not have a sonographic Murphy's sign.  Common bile duct:  Measures 0.5 cm.  Liver:  The liver is echogenic and slightly heterogeneous.  IVC:  Appears normal.  Pancreas:  Poorly visualized due to bowel gas.  Spleen:  The patient had a splenectomy but there is a round structure in the left upper quadrant consistent with a splenule. Splenule measures up to 2.4 cm and this was present on the prior examination.  Right Kidney:  Right kidney measures 10.1 cm in length without hydronephrosis.  Left Kidney:  Left kidney is difficult to evaluate and roughly measures 9.6 cm in length without hydronephrosis.  Abdominal aorta:  Retroperitoneal structures are difficult to evaluate.  Cannot exclude aneurysmal dilatation of the mid abdominal aorta.  IMPRESSION: There is a 0.9 cm gallstone near the gallbladder neck.  No evidence for biliary dilatation.  Limited evaluation of the abdominal aorta.  There is concern for aneurysmal dilatation in the mid abdominal aorta.  This could be better evaluated with CT.   Original Report Authenticated By: Richarda Overlie, M.D.     Scheduled Meds:    .  ceFAZolin (ANCEF) IV  2 g Intravenous On Call to OR  . diltiazem  360 mg Oral Daily  . HYDROmorphone      . influenza  inactive virus vaccine  0.5 mL Intramuscular Tomorrow-1000  .  insulin aspart  0-9 Units Subcutaneous Q4H  . labetalol      . levothyroxine  50 mcg Oral Q breakfast  . sodium chloride  3 mL Intravenous Q12H  . DISCONTD: amLODipine  10 mg Oral Daily  . DISCONTD: chlorhexidine  1 application Topical Once  . DISCONTD: diltiazem  360 mg Oral Daily  . DISCONTD: heparin  5,000 Units Subcutaneous Q8H   Continuous Infusions:    . dextrose 5 % and 0.45 % NaCl with KCl 20 mEq/L 100 mL/hr at 05/15/12 0444  . DISCONTD: lactated ringers 50 mL/hr at 05/14/12 1344  . DISCONTD: sodium chloride 0.45 % 1,000 mL with potassium chloride 40 mEq infusion 100 mL/hr at 05/14/12 1017     Stephani Police [facility] Pager [phone]  If 7PM-7AM, please contact night-coverage [email or web address] Password [email or web address] 05/15/2012, 10:57 AM   LOS: 3 days   Attending Patient seen and examined. Agree with the assessment and plan as outlined above. Briefly hypotensive this am S/p Cholecystectomy 10/23 Now with worsening ARF-etiology not evident-?mild drop in Hb-but not large enough to cause hypotension and ARF.This patient had Hypernatremia yesterday and was given IVF. Today she is euvolemic. Will check a renal ultrasound, she does have CHF-will ask Renal to see to as well.  S Ghimire

## 2012-05-15 NOTE — Progress Notes (Signed)
Patient pulse oximetry read 89%.  Remigio Eisenmenger, PA-C present in room, requested placement of O2 at 2L by nasal cannula.  Oxygen was placed by nasal cannula at 2L and pulse oximetry read 94%. Continue to monitor.

## 2012-05-15 NOTE — Evaluation (Deleted)
Physical Therapy Evaluation Patient Details Name: Nicole [name] MRN: [id number] DOB: 1931/06/11 Today's Date: 05/15/2012 Time: 4098-1191 PT Time Calculation (min): 27 min  PT Assessment / Plan / Recommendation Clinical Impression  Admitted following laparoscopic cholecystectomy; Pt. is appropriate for PT at this time due to pt fuctioning below basline level. Pt. was independent with all transfers, gait, bed mobilty, and ADLs prior to admission. Upon eval she is unable to perform sidelying to sit, transfers, and ambulate without assistance and exhibits decreased tolerance to activity. Pt. was educated on importance of getting up and walking with nursing.    PT Assessment  Patient needs continued PT services    Follow Up Recommendations  Supervision for mobility/OOB       Barriers to Discharge None Pt. has family that could care for pt upon discharge    Equipment Recommendations  Rolling walker with 5" wheels    Recommendations for Other Services     Frequency Min 3X/week    Precautions / Restrictions Precautions Precautions: None Restrictions Weight Bearing Restrictions: No   Pertinent Vitals/Pain BP - 80/49 after ambulating to bathroom; 108/70 after resting in chair Pt. Reported 9/10 pain in R shoulder at beginning of tx; 7/10 at end of tx      Mobility  Bed Mobility Bed Mobility: Rolling Left;Left Sidelying to Sit Rolling Left: 5: Supervision Left Sidelying to Sit: 4: Min assist Details for Bed Mobility Assistance: Unable to roll R due to pain in shoulder; Pt. required assistance at shoulder to achieve upright from sidelying. Pt. instructed to sit up from sidelying due to surgery Transfers Transfers: Sit to Stand;Stand to Sit Sit to Stand: 4: Min assist Stand to Sit: 4: Min assist Details for Transfer Assistance: Pt. required cueing for hand placement and sequencing of tasks. Ambulation/Gait Ambulation/Gait Assistance: 4: Min assist Ambulation Distance (Feet): 15  Feet Assistive device: Rolling walker Ambulation/Gait Assistance Details: Pt. ambulated without AD at home but required assiatnce and AD due to dizziness and weakness. Gait Pattern: Step-through pattern Gait velocity: decreased Stairs: No           PT Diagnosis: Difficulty walking  PT Problem List: Decreased activity tolerance;Decreased mobility PT Treatment Interventions: Functional mobility training;Stair training;Gait training;Therapeutic activities;Therapeutic exercise;Balance training   PT Goals Acute Rehab PT Goals PT Goal Formulation: With patient Time For Goal Achievement: 05/22/12 Potential to Achieve Goals: Good Pt will go Supine/Side to Sit: with modified independence PT Goal: Supine/Side to Sit - Progress: Goal set today Pt will go Sit to Stand: with modified independence PT Goal: Sit to Stand - Progress: Goal set today Pt will go Stand to Sit: with modified independence PT Goal: Stand to Sit - Progress: Goal set today Pt will Ambulate: >150 feet;with modified independence;with least restrictive assistive device PT Goal: Ambulate - Progress: Goal set today Pt will Go Up / Down Stairs: 3-5 stairs;with supervision;with rail(s) PT Goal: Up/Down Stairs - Progress: Goal set today  Visit Information  Last PT Received On: 05/15/12 Assistance Needed: +1    Subjective Data  Subjective: My shoulder hurts   Prior Functioning  Home Living Lives With: Alone Available Help at Discharge: Friend(s);Family;Available PRN/intermittently Type of Home: House Home Access: Stairs to enter [employer] of Steps: 4 Entrance Stairs-Rails: Left;Right;Can reach both Home Layout: One level Bathroom Shower/Tub: Engineer, manufacturing systems: Handicapped height Home Adaptive Equipment: Grab bars in shower;Shower chair with back Prior Function Level of Independence: Independent Able to Take Stairs?: Yes Driving: Yes Vocation: Retired Musician: No  difficulties    Cognition  Overall Cognitive Status: Appears within functional limits for tasks assessed/performed Arousal/Alertness: Awake/alert Orientation Level: Appears intact for tasks assessed Behavior During Session: [facility] for tasks performed          End of Session PT - End of Session Equipment Utilized During Treatment: Gait belt Activity Tolerance: Treatment limited secondary to medical complications (Comment) (Pt. became orthostatic following ambulation to bathroom) Patient left: in chair;with call bell/phone within reach;with nursing in room Nurse Communication: Mobility status (Nursing notified about pts BP and dizziness)  GP     Army Chaco SPT 05/15/2012, 10:21 AM

## 2012-05-15 NOTE — Progress Notes (Signed)
Patient ID: Nicole [name], female   DOB: [birth year], 78 y.o.   MRN: [id number] AAS ordered this morning secondary to abdominal pain by cards.  This does show a small amount of free air under her diaphragm, however this is expected 1 day post op from lap chole.  Her H/H was stable at 1100.  I came up to check on patient.  At this time, I was informed the patient has had fairly significant bleeding from her umbilical incision.  When her dressing was removed no further bleeding was seen at her incision however, she has a large subcutaneous hematoma noted with diffuse ecchymosis.  She is very tender over this.  She does have some abdominal distention noted.  She does complain of some nausea as well.  Most recent BP is 125/82 and P 89.  I have spoken to Dr. Jerral Ralph.  We will restart some gentle IVFs, decrease diet to clears.  Unfortunately, there is nothing to do for this hematoma right now.  Hopefully it will tamponade itself off.  Q8h H/Hs have been ordered for today.  We will closely follow these numbers given the patient's recent hypotension, which has now resolved.  I have d/w the patient and her goddaughter who understand the situation.  A pressure dressing was replaced over her umbilical incision to help prevent further bleeding.  Draeden Kellman E 3:48 PM 05/15/2012

## 2012-05-15 NOTE — Consult Note (Signed)
78 yo female with 2 -3 days of persistent ruq abd pain with n/v which is worse after eating. No fever. Vomit bilious in nature. No diarrhea. abd feels bloated. Came to ED 10/20 and felt to have symptomatic cholelithiasis per imaging, surgery at that time wished for pt to stop her plavix/asa and f/u as outpt to set up elective chole. However patient returned to ER  due to uncontrolled pain and vomiting despite zofran and pain meds at home.   She underwent lap cholecystectomy 10/23 and had some post op periumbilical wound bleeding.  The patient reports poorly controlled hypertension and has had uncontrolled hypertension in the hospital. This morning when up with PT patient had hypotensive episode with BP of 80/49.  BP was as high as 180/124 yesterday.  sCreat was 1.17 on 05/11/12 and 1.32 on 08/09/11.   Renal US 10.5 cm R, 10.4 cm L, no hydro.  She has a past history of urolithiasis requiring GU intervention.  sCr was 2.81 today.     Past Medical History  Diagnosis Date  . Colon cancer 07/1997  . Hypertension   . Diabetes mellitus   . CHF (congestive heart failure)   . AAA (abdominal aortic aneurysm)   . Atrial fibrillation   . Hypercholesteremia   . Kidney stone     renal calculi   Past Surgical History  Procedure Date  . Hemicolectomy 08/11/1997  . Kidney stone surgery   . Colon surgery     to remove colon ca  . Cholecystectomy 05/14/2012    Procedure: LAPAROSCOPIC CHOLECYSTECTOMY WITH INTRAOPERATIVE CHOLANGIOGRAM;  Surgeon: Currie Paris, MD;  Location: [facility] OR;  Service: General;  Laterality: N/A;   Social History:  reports that she has never smoked. She has never used smokeless tobacco. She reports that she does not drink alcohol or use illicit drugs. Allergies:  Allergies  Allergen Reactions  . Iohexol      Code: VOM, Desc: PT REPORTS VOMITING W/ IVP DYE- ARS 12/26/08, Onset Date: [id number]    History reviewed. No pertinent family history.  Medications:  Scheduled:   .  diltiazem  360 mg Oral Daily  . HYDROmorphone      . insulin aspart  0-9 Units Subcutaneous Q4H  . labetalol      . levothyroxine  50 mcg Oral Q breakfast  . sodium chloride  3 mL Intravenous Q12H  . DISCONTD: amLODipine  10 mg Oral Daily  . DISCONTD: diltiazem  360 mg Oral Daily  . DISCONTD: heparin  5,000 Units Subcutaneous Q8H    ROS: as per HPI Blood pressure 151/73, pulse 93, temperature 98.2 F (36.8 C), temperature source Oral, resp. rate 24, height 5\' 11"  (1.803 m), weight 80 kg (176 lb 5.9 oz), SpO2 97.00%.  General appearance: alert, cooperative, appears stated age and moderate distress Head: Normocephalic, without obvious abnormality, atraumatic Eyes: negative Ears: normal TM's and external ear canals both ears Nose: no discharge Throat: normal findings: lips normal without lesions Resp: clear to auscultation bilaterally Chest wall: no tenderness Cardio: regular rate and rhythm, S1, S2 normal, no murmur, click, rub or gallop GI: distended with umbilical bandage and post op changes Extremities: extremities normal, atraumatic, no cyanosis or edema Skin: turgor slightly diminished Neurologic: Grossly normal Results for orders placed during the hospital encounter of 05/12/12 (from the past 48 hour(s))  GLUCOSE, CAPILLARY     Status: Abnormal   Collection Time   05/13/12  7:59 PM      Component Value Range Comment  Glucose-Capillary 133 (*) 70 - 99 mg/dL   GLUCOSE, CAPILLARY     Status: Abnormal   Collection Time   05/14/12 12:05 AM      Component Value Range Comment   Glucose-Capillary 135 (*) 70 - 99 mg/dL   SURGICAL PCR SCREEN     Status: Abnormal   Collection Time   05/14/12  4:10 AM      Component Value Range Comment   MRSA, PCR NEGATIVE  NEGATIVE    Staphylococcus aureus POSITIVE (*) NEGATIVE   GLUCOSE, CAPILLARY     Status: Abnormal   Collection Time   05/14/12  4:19 AM      Component Value Range Comment   Glucose-Capillary 110 (*) 70 - 99 mg/dL   BASIC  METABOLIC PANEL     Status: Abnormal   Collection Time   05/14/12  5:50 AM      Component Value Range Comment   Sodium 151 (*) 135 - 145 mEq/L    Potassium 3.4 (*) 3.5 - 5.1 mEq/L    Chloride 109  96 - 112 mEq/L    CO2 29  19 - 32 mEq/L    Glucose, Bld 112 (*) 70 - 99 mg/dL    BUN 21  6 - 23 mg/dL    Creatinine, Ser 1.91 (*) 0.50 - 1.10 mg/dL    Calcium 9.0  8.4 - 47.8 mg/dL    GFR calc non Af Amer 37 (*) >90 mL/min    GFR calc Af Amer 42 (*) >90 mL/min   MAGNESIUM     Status: Normal   Collection Time   05/14/12  5:50 AM      Component Value Range Comment   Magnesium 1.9  1.5 - 2.5 mg/dL   GLUCOSE, CAPILLARY     Status: Abnormal   Collection Time   05/14/12  7:53 AM      Component Value Range Comment   Glucose-Capillary 111 (*) 70 - 99 mg/dL   GLUCOSE, CAPILLARY     Status: Abnormal   Collection Time   05/14/12 11:43 AM      Component Value Range Comment   Glucose-Capillary 122 (*) 70 - 99 mg/dL   GLUCOSE, CAPILLARY     Status: Abnormal   Collection Time   05/14/12  3:24 PM      Component Value Range Comment   Glucose-Capillary 123 (*) 70 - 99 mg/dL    Comment 1 Notify RN     GLUCOSE, CAPILLARY     Status: Abnormal   Collection Time   05/14/12  4:59 PM      Component Value Range Comment   Glucose-Capillary 148 (*) 70 - 99 mg/dL   CBC     Status: Abnormal   Collection Time   05/14/12  5:39 PM      Component Value Range Comment   WBC 9.5  4.0 - 10.5 K/uL    RBC 4.33  3.87 - 5.11 MIL/uL    Hemoglobin 12.8  12.0 - 15.0 g/dL    HCT 29.5  62.1 - 30.8 %    MCV 83.8  78.0 - 100.0 fL    MCH 29.6  26.0 - 34.0 pg    MCHC 35.3  30.0 - 36.0 g/dL    RDW 65.7 (*) 84.6 - 15.5 %    Platelets 263  150 - 400 K/uL   CREATININE, SERUM     Status: Abnormal   Collection Time   05/14/12  5:39 PM  Component Value Range Comment   Creatinine, Ser 1.08  0.50 - 1.10 mg/dL    GFR calc non Af Amer 48 (*) >90 mL/min    GFR calc Af Amer 55 (*) >90 mL/min   GLUCOSE, CAPILLARY      Status: Abnormal   Collection Time   05/14/12  8:27 PM      Component Value Range Comment   Glucose-Capillary 204 (*) 70 - 99 mg/dL    Comment 1 Documented in Chart      Comment 2 Notify RN     GLUCOSE, CAPILLARY     Status: Abnormal   Collection Time   05/15/12 12:13 AM      Component Value Range Comment   Glucose-Capillary 199 (*) 70 - 99 mg/dL    Comment 1 Documented in Chart      Comment 2 Notify RN     GLUCOSE, CAPILLARY     Status: Abnormal   Collection Time   05/15/12  4:28 AM      Component Value Range Comment   Glucose-Capillary 162 (*) 70 - 99 mg/dL    Comment 1 Documented in Chart      Comment 2 Notify RN     BASIC METABOLIC PANEL     Status: Abnormal   Collection Time   05/15/12  6:10 AM      Component Value Range Comment   Sodium 140  135 - 145 mEq/L    Potassium 4.2  3.5 - 5.1 mEq/L    Chloride 102  96 - 112 mEq/L    CO2 26  19 - 32 mEq/L    Glucose, Bld 158 (*) 70 - 99 mg/dL    BUN 21  6 - 23 mg/dL    Creatinine, Ser 2.95 (*) 0.50 - 1.10 mg/dL DELTA CHECK NOTED   Calcium 8.2 (*) 8.4 - 10.5 mg/dL    GFR calc non Af Amer 19 (*) >90 mL/min    GFR calc Af Amer 22 (*) >90 mL/min   CBC     Status: Abnormal   Collection Time   05/15/12  6:10 AM      Component Value Range Comment   WBC 8.5  4.0 - 10.5 K/uL    RBC 3.81 (*) 3.87 - 5.11 MIL/uL    Hemoglobin 10.9 (*) 12.0 - 15.0 g/dL    HCT 28.4 (*) 13.2 - 46.0 %    MCV 84.0  78.0 - 100.0 fL    MCH 28.6  26.0 - 34.0 pg    MCHC 34.1  30.0 - 36.0 g/dL    RDW 44.0 (*) 10.2 - 15.5 %    Platelets 254  150 - 400 K/uL   GLUCOSE, CAPILLARY     Status: Abnormal   Collection Time   05/15/12  7:55 AM      Component Value Range Comment   Glucose-Capillary 151 (*) 70 - 99 mg/dL   CBC     Status: Abnormal   Collection Time   05/15/12 11:14 AM      Component Value Range Comment   WBC 9.5  4.0 - 10.5 K/uL    RBC 3.69 (*) 3.87 - 5.11 MIL/uL    Hemoglobin 10.7 (*) 12.0 - 15.0 g/dL    HCT 72.5 (*) 36.6 - 46.0 %    MCV  84.3  78.0 - 100.0 fL    MCH 29.0  26.0 - 34.0 pg    MCHC 34.4  30.0 - 36.0 g/dL    RDW  15.5  11.5 - 15.5 %    Platelets 252  150 - 400 K/uL   PRO B NATRIURETIC PEPTIDE     Status: Abnormal   Collection Time   05/15/12 11:14 AM      Component Value Range Comment   Pro B Natriuretic peptide (BNP) 2820.0 (*) 0 - 450 pg/mL   BASIC METABOLIC PANEL     Status: Abnormal   Collection Time   05/15/12 11:14 AM      Component Value Range Comment   Sodium 138  135 - 145 mEq/L    Potassium 3.9  3.5 - 5.1 mEq/L    Chloride 100  96 - 112 mEq/L    CO2 25  19 - 32 mEq/L    Glucose, Bld 178 (*) 70 - 99 mg/dL    BUN 24 (*) 6 - 23 mg/dL    Creatinine, Ser 1.61 (*) 0.50 - 1.10 mg/dL    Calcium 8.3 (*) 8.4 - 10.5 mg/dL    GFR calc non Af Amer 15 (*) >90 mL/min    GFR calc Af Amer 17 (*) >90 mL/min   GLUCOSE, CAPILLARY     Status: Abnormal   Collection Time   05/15/12 12:36 PM      Component Value Range Comment   Glucose-Capillary 157 (*) 70 - 99 mg/dL   HEMOGLOBIN AND HEMATOCRIT, BLOOD     Status: Abnormal   Collection Time   05/15/12  3:27 PM      Component Value Range Comment   Hemoglobin 11.8 (*) 12.0 - 15.0 g/dL    HCT 09.6 (*) 04.5 - 46.0 %   GLUCOSE, CAPILLARY     Status: Abnormal   Collection Time   05/15/12  4:22 PM      Component Value Range Comment   Glucose-Capillary 153 (*) 70 - 99 mg/dL    Dg Cholangiogram Operative  05/14/2012  *RADIOLOGY REPORT*  Clinical Data: Laparoscopic cholecystectomy  INTRAOPERATIVE CHOLANGIOGRAM  Comparison:  Abdominal ultrasound - 05/12/2012  Findings:  Intraoperative angiographic images of the right upper abdominal quadrant during laparoscopic cholecystectomy are provided for review.  Surgical clips overlie the expected location of the gallbladder fossa.  Contrast injection demonstrates selective cannulation of the central aspect of the cystic duct.  There is brisk passage of contrast through the central aspect of the cystic duct with filling of a mildly  dilated common bile duct. There is brisk passage of contrast though the CBD and into the descending portion of the duodenum.  There is minimal reflux of injected contrast into the common hepatic duct and central aspect of the nondilated intrahepatic biliary system.  There are no discrete filling defects within the opacified portions of the biliary system to suggest the presence of choledocholithiasis.  IMPRESSION:  Intraoperative cholangiogram as above.  No discrete filling defects to suggest the presence of choledocholithiasis.   Original Report Authenticated By: Waynard Reeds, M.D.    US Renal  05/15/2012  *RADIOLOGY REPORT*  Clinical Data: Acute renal failure  RENAL/URINARY TRACT ULTRASOUND COMPLETE  Comparison:  Abdominal ultrasound 05/12/2012, 11/08/2006 CT  Findings:  Right Kidney:  10.5 cm. No hydronephrosis or focal mass.  Left Kidney:  10.4 cm. No hydronephrosis or focal mass.  Bladder:  Normal in appearance.  Ascites is noted.  IMPRESSION: Normal exam.  No hydronephrosis or solid renal mass.  Ascites.   Original Report Authenticated By: Harrel Lemon, M.D.    Dg Chest Port 1 View  05/15/2012  *RADIOLOGY REPORT*  Clinical Data: Hypoxia, CHF, lower abdominal and right shoulder pain, 2 days post cholecystectomy, history hypertension, CHF, atrial fibrillation, diabetes  PORTABLE CHEST - 1 VIEW  Comparison: Portable exam 1100 hours compared to 12/02/2009  Findings: Enlargement of cardiac silhouette with pulmonary vascular congestion. Atherosclerotic calcification aorta. Minimal bibasilar atelectasis. Lungs otherwise clear. Bones demineralized. No pleural effusion or pneumothorax.  IMPRESSION: Enlargement of cardiac silhouette with pulmonary vascular congestion. Minimal bibasilar atelectasis.   Original Report Authenticated By: Lollie Marrow, M.D.    Dg Abd Acute W/chest  05/15/2012  *RADIOLOGY REPORT*  Clinical Data: Tympanitic abdomen.  Postoperative abdomen. Hypoactive bowel sounds.   Abdominal pain.  ACUTE ABDOMEN SERIES (ABDOMEN 2 VIEW & CHEST 1 VIEW)  Comparison: 05/15/2012 chest radiograph.  Findings: Low lung volumes are present with basilar atelectasis. Cardiopericardial silhouette is unchanged, likely within normal limits allowing for volumes of inspiration.  Cholecystectomy clips are present in the right upper quadrant.  No free air is identified in the abdomen.  Small and large bowel dilation is present with scattered air fluid levels most compatible with ileus in the postoperative setting.  Paucity of rectal gas. Gas and stool is present along the descending colon.  Bilateral right greater than left hip osteoarthritis is incidentally noted. Right sacroiliac joint degenerative disease.  IMPRESSION:  1.  Low volume chest. 2.  Cholecystectomy. 3.  Bowel gas pattern compatible with ileus.   Original Report Authenticated By: Andreas Newport, M.D.     Assessment:  1 Acute Kidney Injury, hemodynamically mediated  2 Severe systemic arterial hypertension 3 Hypovolemia due to gastrointestinal losses 4 Post op Cholecystectomy  Plan: 1 fluid replacement with normal saline, increase rate  2 allow BP to drift upward some for renal perfusion 3 will need much more aggressive BP control as out patient. 4 anticipate worsening of renal function before improvement  Arturo Freundlich C 05/15/2012, 7:24 PM

## 2012-05-15 NOTE — Evaluation (Addendum)
Physical Therapy Evaluation Patient Details Name: Nicole [name] MRN: [id number] DOB: March 23, 1931 Today's Date: 05/15/2012 Time: 4098-1191 PT Time Calculation (min): 27 min  PT Assessment / Plan / Recommendation Clinical Impression  Admitted following laparoscpic cholecystectomy; Pt. is appropriate for PT at this time due to pt. not fuctioning at baseline level. Pt. will benefit from acute therapy to address bed mobility, transfer, and gait training. Pt. was eduated on importance of getting up and walking with nursing.    PT Assessment  Patient needs continued PT services    Follow Up Recommendations  Supervision for mobility/OOB    Does the patient have the potential to tolerate intense rehabilitation      Barriers to Discharge None Pt. has family that could care for pt upon discharge    Equipment Recommendations  Rolling walker with 5" wheels    Recommendations for Other Services     Frequency Min 3X/week    Precautions / Restrictions Precautions Precautions: None Restrictions Weight Bearing Restrictions: No   Pertinent Vitals/Pain BP- 80/49 after ambulating to bathroom; 108/70 after resting in chair RN assessing Pt. Reported 9/10 pain in R shoulder at beginning of eval; 7/10 at end of tx.      Mobility  Bed Mobility Bed Mobility: Rolling Left;Left Sidelying to Sit Rolling Left: 5: Supervision Left Sidelying to Sit: 4: Min assist Details for Bed Mobility Assistance: Unable to roll R due to pain in shoulder; Pt. required assistance at shoulder to achieve upright from sidelying. Pt. instructed to sit up from sidelying due to surgery Transfers Transfers: Sit to Stand;Stand to Sit Sit to Stand: 4: Min assist Stand to Sit: 4: Min assist Details for Transfer Assistance: Pt. required cueing for hand placement and sequencing of tasks. Ambulation/Gait Ambulation/Gait Assistance: 4: Min assist Ambulation Distance (Feet): 15 Feet (15', 8') Assistive device: Rolling  walker Ambulation/Gait Assistance Details: Pt. ambulated without AD at home but required assiatnce and AD due to dizziness and weakness. Gait Pattern: Step-through pattern Gait velocity: decreased Stairs: No    Shoulder Instructions     Exercises     PT Diagnosis: Difficulty walking  PT Problem List: Decreased activity tolerance;Decreased mobility PT Treatment Interventions: Functional mobility training;Stair training;Gait training;Therapeutic activities;Therapeutic exercise;Balance training   PT Goals Acute Rehab PT Goals PT Goal Formulation: With patient Time For Goal Achievement: 05/22/12 Potential to Achieve Goals: Good Pt will go Supine/Side to Sit: with modified independence PT Goal: Supine/Side to Sit - Progress: Goal set today Pt will go Sit to Stand: with modified independence PT Goal: Sit to Stand - Progress: Goal set today Pt will go Stand to Sit: with modified independence PT Goal: Stand to Sit - Progress: Goal set today Pt will Ambulate: >150 feet;with modified independence;with least restrictive assistive device PT Goal: Ambulate - Progress: Goal set today Pt will Go Up / Down Stairs: 3-5 stairs;with supervision;with rail(s) PT Goal: Up/Down Stairs - Progress: Goal set today  Visit Information  Last PT Received On: 05/15/12 Assistance Needed: +1    Subjective Data  Subjective: My shoulder hurts   Prior Functioning  Home Living Lives With: Alone Available Help at Discharge: Friend(s);Family;Available PRN/intermittently Type of Home: House Home Access: Stairs to enter [employer] of Steps: 4 Entrance Stairs-Rails: Left;Right;Can reach both Home Layout: One level Bathroom Shower/Tub: Engineer, manufacturing systems: Handicapped height Home Adaptive Equipment: Grab bars in shower;Shower chair with back Prior Function Level of Independence: Independent Able to Take Stairs?: Yes Driving: Yes Vocation: Retired Musician: No  difficulties  Cognition  Overall Cognitive Status: Appears within functional limits for tasks assessed/performed Arousal/Alertness: Awake/alert Orientation Level: Appears intact for tasks assessed Behavior During Session: [facility] for tasks performed    Extremity/Trunk Assessment     Balance    End of Session PT - End of Session Equipment Utilized During Treatment: Gait belt Activity Tolerance: Treatment limited secondary to medical complications (Comment) (Pt. became orthostatic following ambulation to bathroom) Patient left: in chair;with call bell/phone within reach;with nursing in room Nurse Communication: Mobility status (Nursing notified about pts BP and dizziness)  GP     Army Chaco SPT 05/15/2012, 10:45 AM  Seen and agree with above student note Delaney Meigs, PT [phone]

## 2012-05-15 NOTE — Progress Notes (Signed)
Pt complaining of abdominal pain and also having some bleeding from abdominal site.  Dressing to abdominal site has been changed. Pt given 1mg  of morphine IV. Abdomen seems to look more distended and feels more tight. RN paged MD on call and also spoke with surgeon who stated he will come by to assess pt as soon as he can. RN will continue to monitor pt. H & H are stable at this time at 11.6 and 33.2.

## 2012-05-15 NOTE — Clinical Documentation Improvement (Signed)
Clinical Documentation Improvement Program       Query Request  THIS DOCUMENT IS NOT A PERMANENT PART OF THE MEDICAL RECORD  TO RESPOND TO THE THIS QUERY, FOLLOW THE INSTRUCTIONS BELOW:  1. If needed, update documentation for the patient's encounter via the notes activity.  2. Access this query again and click edit on the In [employer].  3. After updating, or not, click F2 to complete all highlighted (required) fields concerning your review. Select "additional documentation in the medical record" OR "no additional documentation provided".  4. Click Sign note button.  5. The deficiency will fall out of your In Basket *Please let us know if you are not able to complete this workflow by phone or e-mail (listed below).         05/15/12   Dear Dr. Windell Norfolk In an effort to better capture your patient's severity of illness, reflect appropriate length of stay and utilization of resources, a review of the patient medical record has revealed the following indicators. Please exercise your independent judgment. The fact queries are asked, does not imply that any particular answer is desired or expected. Please clarify and document in a progress note and/or discharge summary the clinical condition associated with the following supporting information. Conditions documented as possible, probable, or suspected can be coded if restated at the time of discharge.   Being documented "respiratory distress" per card cons with "atelectasis",   "hypoxia with mild tachycardia"   in the post op setting would either following diagnosis be more appropriate if so please document in notes. Thank you   Acute Respiratory Distress  Acute Respiratory Insufficiency post op   Other Condition  Cannot Clinically Determine  Thank You,  Leonette Most Addison  Clinical Documentation Specialist RN, BSN: Pager  [phone]  HIM off # [phone]  Health Information Management [place]  05/16/12 - "resp failure"  documented by Dr. Jerral Ralph in pn 05/16/12.  Mathis Dad RN

## 2012-05-15 NOTE — Anesthesia Postprocedure Evaluation (Signed)
  Anesthesia Post-op Note  Patient: Nicole [name]  Procedure(s) Performed: Procedure(s) (LRB) with comments: LAPAROSCOPIC CHOLECYSTECTOMY WITH INTRAOPERATIVE CHOLANGIOGRAM (N/A)  Patient Location: PACU  Anesthesia Type: General  Level of Consciousness: awake and alert   Airway and Oxygen Therapy: Patient Spontanous Breathing and Patient connected to nasal cannula oxygen  Post-op Pain: mild  Post-op Assessment: Post-op Vital signs reviewed, Patient's Cardiovascular Status Stable, Respiratory Function Stable and Patent Airway  Post-op Vital Signs: Reviewed and stable  Complications: No apparent anesthesia complications

## 2012-05-15 NOTE — Progress Notes (Signed)
Pt has had moderate amount of bleeding from incision above umbilicus. Applied 2 x 2's and abdominal pad to area to stop bleeding. Surgeon and hospitalist called. Awaiting instructions from PA.  Peter Congo RN

## 2012-05-16 ENCOUNTER — Inpatient Hospital Stay (HOSPITAL_COMMUNITY): Payer: Medicare Other

## 2012-05-16 ENCOUNTER — Encounter (HOSPITAL_COMMUNITY): Payer: Self-pay | Admitting: Cardiology

## 2012-05-16 DIAGNOSIS — K567 Ileus, unspecified: Secondary | ICD-10-CM | POA: Insufficient documentation

## 2012-05-16 DIAGNOSIS — K560 Paralytic ileus: Secondary | ICD-10-CM

## 2012-05-16 DIAGNOSIS — K565 Intestinal adhesions [bands], unspecified as to partial versus complete obstruction: Secondary | ICD-10-CM

## 2012-05-16 DIAGNOSIS — I4891 Unspecified atrial fibrillation: Secondary | ICD-10-CM | POA: Insufficient documentation

## 2012-05-16 DIAGNOSIS — D72829 Elevated white blood cell count, unspecified: Secondary | ICD-10-CM

## 2012-05-16 DIAGNOSIS — R6889 Other general symptoms and signs: Secondary | ICD-10-CM

## 2012-05-16 DIAGNOSIS — K929 Disease of digestive system, unspecified: Secondary | ICD-10-CM

## 2012-05-16 DIAGNOSIS — N179 Acute kidney failure, unspecified: Secondary | ICD-10-CM

## 2012-05-16 HISTORY — DX: Intestinal adhesions (bands), unspecified as to partial versus complete obstruction: K56.50

## 2012-05-16 LAB — BLOOD GAS, ARTERIAL
Drawn by: 28340
O2 Content: 2 L/min
O2 Saturation: 93.7 %
Patient temperature: 98.6

## 2012-05-16 LAB — URINALYSIS, ROUTINE W REFLEX MICROSCOPIC
Bilirubin Urine: NEGATIVE
Hgb urine dipstick: NEGATIVE
Ketones, ur: 15 mg/dL — AB
Protein, ur: 30 mg/dL — AB
Urobilinogen, UA: 0.2 mg/dL (ref 0.0–1.0)

## 2012-05-16 LAB — URINE CULTURE: Culture: NO GROWTH

## 2012-05-16 LAB — CBC WITH DIFFERENTIAL/PLATELET
Basophils Absolute: 0 10*3/uL (ref 0.0–0.1)
Basophils Relative: 0 % (ref 0–1)
Hemoglobin: 11.9 g/dL — ABNORMAL LOW (ref 12.0–15.0)
Lymphocytes Relative: 5 % — ABNORMAL LOW (ref 12–46)
MCHC: 35.5 g/dL (ref 30.0–36.0)
Monocytes Relative: 11 % (ref 3–12)
Neutro Abs: 12.9 10*3/uL — ABNORMAL HIGH (ref 1.7–7.7)
Neutrophils Relative %: 84 % — ABNORMAL HIGH (ref 43–77)
RDW: 15.4 % (ref 11.5–15.5)
WBC: 15.4 10*3/uL — ABNORMAL HIGH (ref 4.0–10.5)

## 2012-05-16 LAB — GLUCOSE, CAPILLARY: Glucose-Capillary: 124 mg/dL — ABNORMAL HIGH (ref 70–99)

## 2012-05-16 LAB — CBC
HCT: 34.7 % — ABNORMAL LOW (ref 36.0–46.0)
Hemoglobin: 12.3 g/dL (ref 12.0–15.0)
MCV: 82.6 fL (ref 78.0–100.0)
Platelets: 281 10*3/uL (ref 150–400)
RBC: 4.2 MIL/uL (ref 3.87–5.11)
WBC: 13.9 10*3/uL — ABNORMAL HIGH (ref 4.0–10.5)

## 2012-05-16 LAB — BASIC METABOLIC PANEL
CO2: 24 mEq/L (ref 19–32)
Chloride: 100 mEq/L (ref 96–112)
Creatinine, Ser: 3.36 mg/dL — ABNORMAL HIGH (ref 0.50–1.10)
Glucose, Bld: 189 mg/dL — ABNORMAL HIGH (ref 70–99)

## 2012-05-16 LAB — URINE MICROSCOPIC-ADD ON

## 2012-05-16 LAB — ABO/RH: ABO/RH(D): A POS

## 2012-05-16 LAB — TYPE AND SCREEN

## 2012-05-16 SURGERY — Surgical Case
Anesthesia: *Unknown

## 2012-05-16 MED ORDER — LEVOTHYROXINE SODIUM 100 MCG IV SOLR
25.0000 ug | Freq: Every day | INTRAVENOUS | Status: DC
  Administered 2012-05-16 – 2012-05-26 (×11): 26 ug via INTRAVENOUS
  Filled 2012-05-16 (×14): qty 1.3

## 2012-05-16 MED ORDER — IOHEXOL 300 MG/ML  SOLN
20.0000 mL | INTRAMUSCULAR | Status: AC
  Administered 2012-05-16 (×2): 20 mL via ORAL

## 2012-05-16 MED ORDER — CIPROFLOXACIN IN D5W 200 MG/100ML IV SOLN
200.0000 mg | INTRAVENOUS | Status: DC
  Administered 2012-05-16 – 2012-05-18 (×2): 200 mg via INTRAVENOUS
  Filled 2012-05-16 (×3): qty 100

## 2012-05-16 MED ORDER — TECHNETIUM TC 99M MEBROFENIN IV KIT
5.0000 | PACK | Freq: Once | INTRAVENOUS | Status: AC | PRN
  Administered 2012-05-16: 5 via INTRAVENOUS

## 2012-05-16 MED ORDER — DILTIAZEM LOAD VIA INFUSION
10.0000 mg | Freq: Once | INTRAVENOUS | Status: AC
  Administered 2012-05-16 – 2012-05-17 (×2): 10 mg via INTRAVENOUS
  Filled 2012-05-16: qty 10

## 2012-05-16 MED ORDER — SODIUM CHLORIDE 0.9 % IV BOLUS (SEPSIS)
500.0000 mL | Freq: Once | INTRAVENOUS | Status: AC
  Administered 2012-05-16: 500 mL via INTRAVENOUS

## 2012-05-16 MED ORDER — DILTIAZEM HCL 100 MG IV SOLR
5.0000 mg/h | INTRAVENOUS | Status: DC
  Administered 2012-05-16: 5 mg/h via INTRAVENOUS
  Administered 2012-05-16 – 2012-05-24 (×23): 15 mg/h via INTRAVENOUS
  Filled 2012-05-16 (×18): qty 100

## 2012-05-16 MED ORDER — DILTIAZEM HCL 100 MG IV SOLR
10.0000 mg | Freq: Once | INTRAVENOUS | Status: DC
  Administered 2012-05-16: 10 mg via INTRAVENOUS

## 2012-05-16 MED ORDER — METRONIDAZOLE IN NACL 5-0.79 MG/ML-% IV SOLN
500.0000 mg | Freq: Three times a day (TID) | INTRAVENOUS | Status: DC
  Administered 2012-05-16 – 2012-05-21 (×15): 500 mg via INTRAVENOUS
  Filled 2012-05-16 (×17): qty 100

## 2012-05-16 NOTE — Progress Notes (Signed)
The patient looks very uncomfortable and may ultimately require reoperation.  Her abdomen is distended with hypoactive bowel sounds.  She has had some bleeding from her umbilical incision, but this looks like liquefied hematoma from the surrounding peri-umbilical hematoma.  More concerning is her shortness of breath and her pain.  A KUB is being performed.  I will transfer the patient to the SDU.  Recheck CBC with WBC.  Get serum lactate level and place Foley.  Continue to volume resuscitate.  Possible bowel injury.  If the patient continues to deteriorate she may require reoperation.  Marta Lamas. Gae Bon, MD, FACS [phone] [facility] Surgery

## 2012-05-16 NOTE — Progress Notes (Signed)
Pt to nuclear med

## 2012-05-16 NOTE — Progress Notes (Signed)
[facility] PROGRESS NOTE  VARSHINI KIEU ZOX:[id number] DOB: 16-Aug-1930 DOA: 05/12/2012 PCP: Bufford Spikes, DO  Called per nsg to see with increased abd distention this pm, also has had bleeding from incision. She is a 78yo admitted with RUQ pain and found to have cholelithiasis/cholecystitis and is s/p lap.cholecystectomy on 10/23. Per nsg an i/o cath was done this pm and 500cc of urine removed, but pt still distended. She is c/o increased abd pain, and per nsg the bleeding from her incision seems to be whenever she is moved. Filed Vitals:   05/16/12 0103  BP: 177/101  Pulse: 113  Temp:   Resp: 26   Exam:   General:  Appears uncomfortable, alert and oriented  Cardiovascular:Tachy, irregular, nl S1s2  Respiratory: decreased BS at bases, no wheezes, resp nonlabored  Abdomen: distended, decreased BS, mild tenderness, umbilical incision with bloody drainage  Extremities: no cyanosis, no edema    Assessment/Plan:  Cholecystitis chronic, acute, S/P lap cholecystectomy 05/14/13 with increasing abd distention and bleeding from incision -abd. Xray, follow -appreciate surgery assistance -as per sugery transferring to SDU, per Dr Lindie Spruce may ultimately require reoperation.  urinary retention -agree with foley placement, follow Afib -continue diltiazem  Jacobb Alen C  [facility] Pager [phone]. If 8PM-8AM, please contact night-coverage at [email or web address], password [facility] 05/16/2012, 1:40 AM  LOS: 4 days

## 2012-05-16 NOTE — Progress Notes (Signed)
Visited w/pt and her son was bedside. Son was very supportive of pt and outside of the room shared w/me why pt is here. During my visit of providing spiritual care, I had prayer w/pt and her son at pt's bedside. Pt and son were very appreciative of visit.  Marjory Lies Chaplain

## 2012-05-16 NOTE — Progress Notes (Signed)
Pt was complaining of excessive abdominal pain, some abdominal bleeding from her wound site, and nausea. Pt not looking very well. RN paged MD on call as well as Careers adviser. Pt's dressing changed to her abdomen. Pt was given 1 mg of morphine at 2341. Pt's abdomen feels tight to touch and pt states " I feel like I'm about to burst" Pt was also given 4mg  of zofran at 0055. Pt also vomited soon afterwards. A bladder scan was done which couldn't pick up much because pt's abdomen was so distended. Pt's abdominal garth was measured and equaled 111 cm around. An  In and out cath was done and pt put out about 500 cc of urine that had an odor as well as sediment in it. UA was sent down. Dr. Lindie Spruce, [facility], as well as Dr. Donna Bernard came up to assess pt. Pt's Vital signs were t- 97.2, 113, bp-168/72, r-24, and o2-97%.  An abdominal ultrasound was done and a foley catheter was put in. Pt to be transferred to another floor.

## 2012-05-16 NOTE — Progress Notes (Signed)
Report was called to RN on 3300. Pt transferred to room 3310

## 2012-05-16 NOTE — Progress Notes (Signed)
S: Feels much better with NG. 1500 cc out at once. Abd pain is decreased from this am O: BP 149/84  Pulse 124  Temp 97.9 F (36.6 C) (Oral)  Resp 28  Ht 5\' 10"  (1.778 m)  Wt 185 lb 3 oz (84 kg)  BMI 26.57 kg/m2  SpO2 96% General: Alert, slightly uncomfortable but clearly improved from this am Abd: less distended, softer, less tender, mainly now tender RUQ occ BS  CT reviewed with radiologist some fluid over liver, probably blood, and some in pelvis, likely blood also No evidence of perforation or leak. Does suggest a SBO, with some non filled distal small bowel loops, but also has air and stool in colon. There is some feculisation of the small bowel however.  IMP; Improved, but still low urine output low. Her HGB is up and I think she is volume depleted.   Plan: Needs more aggressive fluid replacement I think and I will get HIDA scan to be sure there is not also a bile leak that needs to be dealt with

## 2012-05-16 NOTE — Progress Notes (Signed)
S: patient feels better, but still sore O: VS noted Gen: alert, somewhat uncomfortable not septic looking Abd: continued some distention and tender, not different from last exam Data: Bile leak on HIDA  Plan Will ask GI to see and consider stint which will hopefully resolve the issue

## 2012-05-16 NOTE — Progress Notes (Signed)
ANTIBIOTIC CONSULT NOTE - INITIAL  Pharmacy Consult for Cipro Indication: Empiric Bowel Injury  Allergies  Allergen Reactions  . Iohexol      Code: VOM, Desc: PT REPORTS VOMITING W/ IVP DYE- ARS 12/26/08, Onset Date: [id number]     Patient Measurements: Height: 5\' 10"  (177.8 cm) Weight: 185 lb 3 oz (84 kg) IBW/kg (Calculated) : 68.5    Vital Signs: Temp: 97.9 F (36.6 C) (10/25 0815) Temp src: Oral (10/25 0815) BP: 149/84 mmHg (10/25 0815) Pulse Rate: 124  (10/25 0400) Intake/Output from previous day: 10/24 0701 - 10/25 0700 In: 1040.5 [P.O.:170; I.V.:870.5] Out: 400 [Urine:400] Intake/Output from this shift: Total I/O In: 175 [I.V.:175] Out: 25 [Urine:25]  Labs:  [facility] 05/16/12 0700 05/16/12 0143 05/15/12 2317 05/15/12 1114 05/15/12 0610  WBC 13.9* 15.4* -- 9.5 --  HGB 12.3 11.9* 11.6* -- --  PLT 281 247 -- 252 --  LABCREA -- -- -- -- --  CREATININE 3.36* -- -- 2.81* 2.33*   Estimated Creatinine Clearance: 16 ml/min (by C-G formula based on Cr of 3.36). No results found for this basename: VANCOTROUGH:2,VANCOPEAK:2,VANCORANDOM:2,GENTTROUGH:2,GENTPEAK:2,GENTRANDOM:2,TOBRATROUGH:2,TOBRAPEAK:2,TOBRARND:2,AMIKACINPEAK:2,AMIKACINTROU:2,AMIKACIN:2, in the last 72 hours   Microbiology: Recent Results (from the past 720 hour(s))  SURGICAL PCR SCREEN     Status: Abnormal   Collection Time   05/14/12  4:10 AM      Component Value Range Status Comment   MRSA, PCR NEGATIVE  NEGATIVE Final    Staphylococcus aureus POSITIVE (*) NEGATIVE Final     Medical History: Past Medical History  Diagnosis Date  . Colon cancer 07/1997  . Hypertension   . Diabetes mellitus   . CHF (congestive heart failure)   . AAA (abdominal aortic aneurysm)   . Atrial fibrillation   . Hypercholesteremia   . Kidney stone     renal calculi    Medications:  Prescriptions prior to admission  Medication Sig Dispense Refill  . allopurinol (ZYLOPRIM) 100 MG tablet Take 100 mg by mouth as  needed. For gout      . amLODipine (NORVASC) 10 MG tablet Take 10 mg by mouth daily.        Marland Kitchen atorvastatin (LIPITOR) 40 MG tablet Take 40 mg by mouth daily.        Marland Kitchen diltiazem (CARDIZEM CD) 360 MG 24 hr capsule Take 360 mg by mouth daily.        . furosemide (LASIX) 40 MG tablet Take 40 mg by mouth daily.      Marland Kitchen glipiZIDE (GLUCOTROL) 10 MG tablet Take 10 mg by mouth daily.        Marland Kitchen levothyroxine (SYNTHROID, LEVOTHROID) 50 MCG tablet Take 50 mcg by mouth daily.        . magnesium oxide (MAG-OX) 400 MG tablet Take 400 mg by mouth daily.      . metFORMIN (GLUCOPHAGE) 500 MG tablet Take 500 mg by mouth 2 (two) times daily with a meal.        . potassium chloride (K-DUR) 10 MEQ tablet Take 10 mEq by mouth daily.         Admit Complaint: 78 y.o.  female  admitted 05/12/2012 with abdominal pain.  Pharmacy consulted to dose cipro empirically for possible bowel injury.  Overnight Events: 05/16/2012 POD # 4 from laprascopic cholesystectomy,abdominal hematoma.   brief hypotensive episode 10/24,   Assessment: Anticoagulation: Afib, not on anticoagulation PTA (complince concerns re op cardiologist) not a candidate now  Infectious Disease: Empiric peritonitis, Afebrile, but WBC remains elevated 13.0, trend down Antibiotics: Cipro  10/25 Flagyl 10/25 Cultures:  Cardiovascular: HTN, CHF, AAA, A.fib, hypercholesterolemia : diltiazem 5/hr and off all meds given hypotension 10/24, SBP 80-140s, in 140s this am, HR 80-124 Current Weight: 185 lb 3 oz (84 kg)  Endocrinology: DM, SSI, oral meds on hold given SCr, glucose < 200; Hypothyroidsim, levothyroxine 50  Gastrointestinal / Nutrition: Possible bowel injury, NPO  Neurology/MSK:   Nephrology/Urology/Electrolytes: AKI, renal consult want BP to drift up for now and plan better long term control, follow SCr closely  Hematology / Oncology: Colon Cancer  PTA Medication Issues: Home Meds Not Ordered: allopurinol, amlodipine, atorva, dilt 360 daily, lasix,  mag ox, metformin, K 10 daily  Best Practices: DVT Prophylaxis:  SCDs  Goal of Therapy:  Renal adjustment of antibiotics  Plan:  1. Cipro 200 mg IV q24h 2. Follow up SCr, UOP, cultures, clinical course and adjust as clinically indicated.    Thank you for allowing pharmacy to be a part of this patients care team.  Lovenia Kim Pharm.D., BCPS Clinical Pharmacist 05/16/2012 8:47 AM Pager: [phone] Phone: [phone]

## 2012-05-16 NOTE — Progress Notes (Signed)
Back from scan, MD at bedside, VSS

## 2012-05-16 NOTE — Progress Notes (Signed)
PATIENT DETAILS Name: Nicole [name] Age: 78 y.o. Sex: female Date of Birth: 06/27/1931 Admit Date: 05/12/2012 Admitting Physician Currie Paris, MD WUJ:[id number], [name], DO  Subjective: Developed further abdominal discomfort/distention overnight, now in Afib with RVR  Assessment/Plan: Principal Problem: Abdominal Distention -ileus-?suspicion for possible bowel injury- with peritonitis (belly tender) -CT Scan Abd ordered -Spoke with Dr Leland Johns to place NG tube -since belly very tender and increase in WBC-will empirically start on Cipro/Flagyl -will likely need a exploration-await CT Scan results   *Respiratory distress Abdominal Wall hematoma -H/H stable -type and screen -monitor  Active Problems: ARF -appreciate Renal eval -suspicion for hemodynaminc mediated renal injury-as patient was very briefly hypotensive yesterday-retrospectively-hypotension probably was a result from developing abd wall hematoma/?intra-abdominal pathology -await repeat chemistry today  Afib RVR -hold oral cardizem -place on cardizem gtt -not a candidate for anticoagulation-given hematoma/potential for repeart surgery, more importantly has been deemed not a be candidate for anticoagulation by her cardiologist in the past as well-?non compliance -cards following  Resp Failure -hypoxic-stable with just 2-3 L of O2 -suspect 2/2 to abdominal pain/distention/atelectasis/mild pul congestion -monitor for now  DM -holding all oral agents -CBG's relatively stable -will be NPO-so just c/w SSI for now  CHF -both systolic and diastolic dysfunction -still compensated-although some mild pulmonary edema of CXR -cards following  HTN -given hypotension on 10/24- and resultant ARF-holding all antihypertensives -monitor and restart IV meds as needed  Thoracic Aortic Aneurysm -outpatient follow up  H/O Stage III colon Ca -outpatient follow up with Oncology  Disposition: Remain  inpatient  DVT Prophylaxis: scd's  Code Status: Full code   Procedures:  Laparoscopic Cholecystectomy 10/23  CONSULTS:  cardiology, nephrology and general surgery  PHYSICAL EXAM: Vital signs in last 24 hours: Filed Vitals:   05/16/12 0100 05/16/12 0103 05/16/12 0243 05/16/12 0400  BP: 168/92 177/101 168/74 153/99  Pulse: 111 113  124  Temp: 97.2 F (36.2 C)   97.4 F (36.3 C)  TempSrc:    Oral  Resp: 26 26  28   Height:    5\' 10"  (1.778 m)  Weight:    84 kg (185 lb 3 oz)  SpO2: 97%   96%    Weight change:  Body mass index is 26.57 kg/(m^2).   Gen Exam: Awake and alert with clear speech.   Neck: Supple, No JVD.   Chest: B/L Clear.   CVS: S1 S2 Regular, tachycardia.  Abdomen: soft, BS +, significantly more distended this am. Diffusely tender to palpation all over, with some rebound. H Extremities: no edema, lower extremities warm to touch. Neurologic: Non Focal.   Skin: No Rash.   Wounds: N/A.    Intake/Output from previous day:  Intake/Output Summary (Last 24 hours) at 05/16/12 0736 Last data filed at 05/16/12 0600  Gross per 24 hour  Intake  865.5 ml  Output    400 ml  Net  465.5 ml     LAB RESULTS: CBC  Lab 05/16/12 0143 05/15/12 2317 05/15/12 1527 05/15/12 1114 05/15/12 0610 05/14/12 1739 05/13/12 0645 05/12/12 1943 05/11/12 2352  WBC 15.4* -- -- 9.5 8.5 9.5 11.4* -- --  HGB 11.9* 11.6* 11.8* 10.7* 10.9* -- -- -- --  HCT 33.5* 33.2* 32.8* 31.1* 32.0* -- -- -- --  PLT 247 -- -- 252 254 263 290 -- --  MCV 83.1 -- -- 84.3 84.0 83.8 83.6 -- --  MCH 29.5 -- -- 29.0 28.6 29.6 29.3 -- --  MCHC 35.5 -- -- 34.4 34.1 35.3  35.1 -- --  RDW 15.4 -- -- 15.5 15.6* 15.6* 15.7* -- --  LYMPHSABS 0.7 -- -- -- -- -- -- 1.0 1.4  MONOABS 1.8* -- -- -- -- -- -- 0.8 0.6  EOSABS 0.0 -- -- -- -- -- -- 0.0 0.0  BASOSABS 0.0 -- -- -- -- -- -- 0.0 0.0  BANDABS -- -- -- -- -- -- -- -- --    Chemistries   Lab 05/15/12 1114 05/15/12 0610 05/14/12 1739 05/14/12 0550  05/13/12 0645 05/12/12 1943  NA 138 140 -- 151* 145 146*  K 3.9 4.2 -- 3.4* 3.4* 3.7  CL 100 102 -- 109 101 101  CO2 25 26 -- 29 32 31  GLUCOSE 178* 158* -- 112* 169* 187*  BUN 24* 21 -- 21 18 19   CREATININE 2.81* 2.33* 1.08 1.34* 1.25* --  CALCIUM 8.3* 8.2* -- 9.0 9.6 10.3  MG -- -- -- 1.9 -- --    CBG:  Lab 05/16/12 0151 05/15/12 2035 05/15/12 1622 05/15/12 1236 05/15/12 0755  GLUCAP 205* 158* 153* 157* 151*    GFR Estimated Creatinine Clearance: 19.1 ml/min (by C-G formula based on Cr of 2.81).  Coagulation profile  Lab 05/13/12 0645  INR 1.11  PROTIME --    Cardiac Enzymes No results found for this basename: CK:3,CKMB:3,TROPONINI:3,MYOGLOBIN:3 in the last 168 hours  No components found with this basename: POCBNP:3 No results found for this basename: DDIMER:2 in the last 72 hours No results found for this basename: HGBA1C:2 in the last 72 hours No results found for this basename: CHOL:2,HDL:2,LDLCALC:2,TRIG:2,CHOLHDL:2,LDLDIRECT:2 in the last 72 hours No results found for this basename: TSH,T4TOTAL,FREET3,T3FREE,THYROIDAB in the last 72 hours No results found for this basename: VITAMINB12:2,FOLATE:2,FERRITIN:2,TIBC:2,IRON:2,RETICCTPCT:2 in the last 72 hours No results found for this basename: LIPASE:2,AMYLASE:2 in the last 72 hours  Urine Studies No results found for this basename: UACOL:2,UAPR:2,USPG:2,UPH:2,UTP:2,UGL:2,UKET:2,UBIL:2,UHGB:2,UNIT:2,UROB:2,ULEU:2,UEPI:2,UWBC:2,URBC:2,UBAC:2,CAST:2,CRYS:2,UCOM:2,BILUA:2 in the last 72 hours  MICROBIOLOGY: Recent Results (from the past 240 hour(s))  SURGICAL PCR SCREEN     Status: Abnormal   Collection Time   05/14/12  4:10 AM      Component Value Range Status Comment   MRSA, PCR NEGATIVE  NEGATIVE Final    Staphylococcus aureus POSITIVE (*) NEGATIVE Final     RADIOLOGY STUDIES/RESULTS: Dg Cholangiogram Operative  05/14/2012  *RADIOLOGY REPORT*  Clinical Data: Laparoscopic cholecystectomy  INTRAOPERATIVE  CHOLANGIOGRAM  Comparison:  Abdominal ultrasound - 05/12/2012  Findings:  Intraoperative angiographic images of the right upper abdominal quadrant during laparoscopic cholecystectomy are provided for review.  Surgical clips overlie the expected location of the gallbladder fossa.  Contrast injection demonstrates selective cannulation of the central aspect of the cystic duct.  There is brisk passage of contrast through the central aspect of the cystic duct with filling of a mildly dilated common bile duct. There is brisk passage of contrast though the CBD and into the descending portion of the duodenum.  There is minimal reflux of injected contrast into the common hepatic duct and central aspect of the nondilated intrahepatic biliary system.  There are no discrete filling defects within the opacified portions of the biliary system to suggest the presence of choledocholithiasis.  IMPRESSION:  Intraoperative cholangiogram as above.  No discrete filling defects to suggest the presence of choledocholithiasis.   Original Report Authenticated By: Waynard Reeds, M.D.    US Abdomen Complete  05/12/2012  *RADIOLOGY REPORT*  Clinical Data:  Upper abdominal pain.  Concern for gallstones. History of splenectomy.  COMPLETE ABDOMINAL ULTRASOUND  Comparison:  CT 11/08/2006  Findings:  Gallbladder:  There is a 0.9 cm stone near the gallbladder neck. There is no evidence for gallbladder wall thickening.  The patient does not have a sonographic Murphy's sign.  Common bile duct:  Measures 0.5 cm.  Liver:  The liver is echogenic and slightly heterogeneous.  IVC:  Appears normal.  Pancreas:  Poorly visualized due to bowel gas.  Spleen:  The patient had a splenectomy but there is a round structure in the left upper quadrant consistent with a splenule. Splenule measures up to 2.4 cm and this was present on the prior examination.  Right Kidney:  Right kidney measures 10.1 cm in length without hydronephrosis.  Left Kidney:  Left kidney  is difficult to evaluate and roughly measures 9.6 cm in length without hydronephrosis.  Abdominal aorta: Retroperitoneal structures are difficult to evaluate.  Cannot exclude aneurysmal dilatation of the mid abdominal aorta.  IMPRESSION: There is a 0.9 cm gallstone near the gallbladder neck.  No evidence for biliary dilatation.  Limited evaluation of the abdominal aorta.  There is concern for aneurysmal dilatation in the mid abdominal aorta.  This could be better evaluated with CT.   Original Report Authenticated By: Richarda Overlie, M.D.    US Renal  05/15/2012  *RADIOLOGY REPORT*  Clinical Data: Acute renal failure  RENAL/URINARY TRACT ULTRASOUND COMPLETE  Comparison:  Abdominal ultrasound 05/12/2012, 11/08/2006 CT  Findings:  Right Kidney:  10.5 cm. No hydronephrosis or focal mass.  Left Kidney:  10.4 cm. No hydronephrosis or focal mass.  Bladder:  Normal in appearance.  Ascites is noted.  IMPRESSION: Normal exam.  No hydronephrosis or solid renal mass.  Ascites.   Original Report Authenticated By: Harrel Lemon, M.D.    Dg Chest Port 1 View  05/16/2012  *RADIOLOGY REPORT*  Clinical Data: Shortness of breath.  Status post cholecystectomy.  PORTABLE CHEST - 1 VIEW  Comparison: Chest and two views abdomen 05/15/2012 and plain film of the chest 12/02/2009.  Findings: Again seen is cardiomegaly.  Lung volumes are low with basilar atelectasis.  Calcified granuloma left midlung again identified.  No pneumothorax.  IMPRESSION:  1.  Bibasilar atelectasis in a low-volume chest. 2.  Cardiomegaly without edema.   Original Report Authenticated By: Bernadene Bell. D'ALESSIO, M.D.    Dg Chest Port 1 View  05/15/2012  *RADIOLOGY REPORT*  Clinical Data: Hypoxia, CHF, lower abdominal and right shoulder pain, 2 days post cholecystectomy, history hypertension, CHF, atrial fibrillation, diabetes  PORTABLE CHEST - 1 VIEW  Comparison: Portable exam 1100 hours compared to 12/02/2009  Findings: Enlargement of cardiac silhouette with  pulmonary vascular congestion. Atherosclerotic calcification aorta. Minimal bibasilar atelectasis. Lungs otherwise clear. Bones demineralized. No pleural effusion or pneumothorax.  IMPRESSION: Enlargement of cardiac silhouette with pulmonary vascular congestion. Minimal bibasilar atelectasis.   Original Report Authenticated By: Lollie Marrow, M.D.    Dg Abd Acute W/chest  05/15/2012  *RADIOLOGY REPORT*  Clinical Data: Tympanitic abdomen.  Postoperative abdomen. Hypoactive bowel sounds.  Abdominal pain.  ACUTE ABDOMEN SERIES (ABDOMEN 2 VIEW & CHEST 1 VIEW)  Comparison: 05/15/2012 chest radiograph.  Findings: Low lung volumes are present with basilar atelectasis. Cardiopericardial silhouette is unchanged, likely within normal limits allowing for volumes of inspiration.  Cholecystectomy clips are present in the right upper quadrant.  No free air is identified in the abdomen.  Small and large bowel dilation is present with scattered air fluid levels most compatible with ileus in the postoperative setting.  Paucity of rectal gas. Gas  and stool is present along the descending colon.  Bilateral right greater than left hip osteoarthritis is incidentally noted. Right sacroiliac joint degenerative disease.  IMPRESSION:  1.  Low volume chest. 2.  Cholecystectomy. 3.  Bowel gas pattern compatible with ileus.   Original Report Authenticated By: Andreas Newport, M.D.    Dg Abd Portable 1v  05/16/2012  *RADIOLOGY REPORT*  Clinical Data: Abdominal pain and distension, recent surgery.  PORTABLE ABDOMEN - 1 VIEW  Comparison: 05/15/2012  Findings: Hemidiaphragms/upper abdomen excluded from the images. Distended loops of large and small bowel again noted.  Surgical clips right upper quadrant.  The organ outlines normal where seen. No acute osseous finding.  IMPRESSION: Gaseous distension of loops of large and small bowel, similar to prior.   Original Report Authenticated By: Waneta Martins, M.D.      MEDICATIONS: Scheduled Meds:   . insulin aspart  0-9 Units Subcutaneous Q4H  . iohexol  20 mL Oral Q1 Hr x 2  . levothyroxine  26 mcg Intravenous QAC breakfast  . metronidazole  500 mg Intravenous Q8H  . sodium chloride  3 mL Intravenous Q12H  . DISCONTD: amLODipine  10 mg Oral Daily  . DISCONTD: diltiazem  360 mg Oral Daily  . DISCONTD: diltiazem  360 mg Oral Daily  . DISCONTD: heparin  5,000 Units Subcutaneous Q8H  . DISCONTD: levothyroxine  50 mcg Oral Q breakfast   Continuous Infusions:   . sodium chloride 175 mL/hr at 05/16/12 0045  . diltiazem (CARDIZEM) infusion    . DISCONTD: dextrose 5 % and 0.45 % NaCl with KCl 20 mEq/L 100 mL/hr at 05/15/12 0444   PRN Meds:.diphenhydrAMINE, diphenhydrAMINE, hydrALAZINE, labetalol, morphine injection, ondansetron (ZOFRAN) IV, ondansetron, oxyCODONE-acetaminophen, DISCONTD: oxyCODONE-acetaminophen  Antibiotics: Anti-infectives     Start     Dose/Rate Route Frequency Ordered Stop   05/16/12 0745   metroNIDAZOLE (FLAGYL) IVPB 500 mg        500 mg 100 mL/hr over 60 Minutes Intravenous Every 8 hours 05/16/12 0735     05/14/12 0600   ceFAZolin (ANCEF) IVPB 2 g/50 mL premix        2 g 100 mL/hr over 30 Minutes Intravenous On call to O.R. 05/13/12 1424 05/14/12 1407           Jeoffrey Massed, MD  [facility] Pager:[phone]  If 7PM-7AM, please contact night-coverage [email or web address] Password [email or web address] 05/16/2012, 7:36 AM   LOS: 4 days

## 2012-05-16 NOTE — Progress Notes (Signed)
Called by primary RN to see pt for c/o pain & increasing abd girth.  On arrival pt is in bed and O to self & place.  Her abd is distended, firm, & tender.  Surgeon on call was paged & is currently operation. Primary MD also paged & awaiting return call.  BP 168/92, HR 122, T=97.2, rr 26, sats 97 % on 2L.

## 2012-05-16 NOTE — Progress Notes (Signed)
Patient seen about 1800, still uncomfortable, abd slightly distended, tender mainly around hematoma, few BS. Seems stable, but concerning that she has more distention than usual. If not improved will need CT to R/O bowel injury or bile leak

## 2012-05-16 NOTE — Progress Notes (Addendum)
Subjective: No chest pain, no SOB now with NG and after vomiting.   Objective: Vital signs in last 24 hours: Temp:  [97.2 F (36.2 C)-98.7 F (37.1 C)] 97.9 F (36.6 C) (10/25 0815) Pulse Rate:  [80-124] 124  (10/25 0400) Resp:  [18-28] 28  (10/25 0400) BP: (125-177)/(72-101) 149/84 mmHg (10/25 0815) SpO2:  [96 %-98 %] 96 % (10/25 0400) Weight:  [84 kg (185 lb 3 oz)] 84 kg (185 lb 3 oz) (10/25 0400) Weight change:  Last BM Date: 05/10/12 Intake/Output from previous day:  +465,   Today has had 2800 cc emesis 10/24 0701 - 10/25 0700 In: 1040.5 [P.O.:170; I.V.:870.5] Out: 400 [Urine:400] Intake/Output this shift: Total I/O In: 175 [I.V.:175] Out: 2900 [Urine:100; Emesis/NG output:2800]  PE: General:alert and oriented female, currently feeling better. Heart:S1S2 irreg irreg Lungs:fairly clear ZOX:WRUE distention, soft, + pain on rt. abd. Ext:no edema Neuro:oriented   Lab Results:  Basename 05/16/12 0700 05/16/12 0143  WBC 13.9* 15.4*  HGB 12.3 11.9*  HCT 34.7* 33.5*  PLT 281 247   BMET  Basename 05/16/12 0700 05/15/12 1114  NA 141 138  K 3.8 3.9  CL 100 100  CO2 24 25  GLUCOSE 189* 178*  BUN 35* 24*  CREATININE 3.36* 2.81*  CALCIUM 8.7 8.3*   No results found for this basename: TROPONINI:2,CK,MB:2 in the last 72 hours  Lab Results  Component Value Date   CHOL  Value: 160        ATP III CLASSIFICATION:  <200     mg/dL   Desirable  454-098  mg/dL   Borderline High  >=119    mg/dL   High        07/27/7827   HDL 41 12/26/2008   LDLCALC  Value: 97        Total Cholesterol/HDL:CHD Risk Coronary Heart Disease Risk Table                     Men   Women  1/2 Average Risk   3.4   3.3  Average Risk       5.0   4.4  2 X Average Risk   9.6   7.1  3 X Average Risk  23.4   11.0        Use the calculated Patient Ratio above and the CHD Risk Table to determine the patient's CHD Risk.        ATP III CLASSIFICATION (LDL):  <100     mg/dL   Optimal  562-130  mg/dL   Near or Above                     Optimal  130-159  mg/dL   Borderline  865-784  mg/dL   High  >696     mg/dL   Very High 08/31/5282   TRIG 109 12/26/2008   CHOLHDL 3.9 12/26/2008          EKG: Orders placed during the hospital encounter of 05/12/12  . EKG 12-LEAD  . EKG 12-LEAD  . EKG  . EKG 12-LEAD  . EKG 12-LEAD    Studies/Results: Ct Abdomen Pelvis Wo Contrast  05/16/2012  *RADIOLOGY REPORT*  Clinical Data: Abdominal pain and distention.  Status post cholecystectomy 05/12/2012.  CT ABDOMEN AND PELVIS WITHOUT CONTRAST  Technique:  Multidetector CT imaging of the abdomen and pelvis was performed following the standard protocol without intravenous contrast.  Comparison: CT chest and abdomen 11/08/2006 and CT abdomen and pelvis 02/12/2006.  Findings: The patient has small bilateral pleural effusions, larger on the right.  There is associated basilar atelectasis, also worse on the right.  Heart size is enlarged.  No pericardial effusion.  NG tube is in place.  The patient has a small bowel obstruction with dilatation of small bowel loops up to 4.4 cm and extensive stool within small bowel.  Distal small bowel loops are completely decompressed.  Transition point is not identified. No pneumatosis or portal venous gas is present.  Small amount of free intraperitoneal air is seen about the liver and likely due to postoperative change.  There is a small amount of perihepatic and free pelvic fluid with increased attenuation consistent with the presence of hemorrhage. Fluid in the pelvis is Hounsfield unit measurements of 36.5.  No focal liver lesions is seen on uninfused examination.  The patient appears to be status post splenectomy with splenosis noted in the left upper quadrant.  The adrenal glands, kidneys and pancreas appear normal.  Atherosclerotic vascular disease without aneurysm of the aorta is identified.  There is no lymphadenopathy. No focal bony abnormality.  IMPRESSION:  1.  Findings consistent with small bowel  obstruction likely due to adhesions.  No mass or evidence of bowel ischemia is present on this uninfused study. 2.  Small to moderate volume of perihepatic and pelvic hemorrhage. Some of the fluid about the liver could be bowel although this is felt less likely. 3.  Status post splenectomy. 4.  Small bilateral pleural effusions.   Original Report Authenticated By: Bernadene Bell. D'ALESSIO, M.D.    Dg Cholangiogram Operative  05/14/2012  *RADIOLOGY REPORT*  Clinical Data: Laparoscopic cholecystectomy  INTRAOPERATIVE CHOLANGIOGRAM  Comparison:  Abdominal ultrasound - 05/12/2012  Findings:  Intraoperative angiographic images of the right upper abdominal quadrant during laparoscopic cholecystectomy are provided for review.  Surgical clips overlie the expected location of the gallbladder fossa.  Contrast injection demonstrates selective cannulation of the central aspect of the cystic duct.  There is brisk passage of contrast through the central aspect of the cystic duct with filling of a mildly dilated common bile duct. There is brisk passage of contrast though the CBD and into the descending portion of the duodenum.  There is minimal reflux of injected contrast into the common hepatic duct and central aspect of the nondilated intrahepatic biliary system.  There are no discrete filling defects within the opacified portions of the biliary system to suggest the presence of choledocholithiasis.  IMPRESSION:  Intraoperative cholangiogram as above.  No discrete filling defects to suggest the presence of choledocholithiasis.   Original Report Authenticated By: Waynard Reeds, M.D.    US Renal  05/15/2012  *RADIOLOGY REPORT*  Clinical Data: Acute renal failure  RENAL/URINARY TRACT ULTRASOUND COMPLETE  Comparison:  Abdominal ultrasound 05/12/2012, 11/08/2006 CT  Findings:  Right Kidney:  10.5 cm. No hydronephrosis or focal mass.  Left Kidney:  10.4 cm. No hydronephrosis or focal mass.  Bladder:  Normal in appearance.   Ascites is noted.  IMPRESSION: Normal exam.  No hydronephrosis or solid renal mass.  Ascites.   Original Report Authenticated By: Harrel Lemon, M.D.    Dg Chest Port 1 View  05/16/2012  *RADIOLOGY REPORT*  Clinical Data: Shortness of breath.  Status post cholecystectomy.  PORTABLE CHEST - 1 VIEW  Comparison: Chest and two views abdomen 05/15/2012 and plain film of the chest 12/02/2009.  Findings: Again seen is cardiomegaly.  Lung volumes are low with basilar atelectasis.  Calcified granuloma left midlung  again identified.  No pneumothorax.  IMPRESSION:  1.  Bibasilar atelectasis in a low-volume chest. 2.  Cardiomegaly without edema.   Original Report Authenticated By: Bernadene Bell. D'ALESSIO, M.D.    Dg Chest Port 1 View  05/15/2012  *RADIOLOGY REPORT*  Clinical Data: Hypoxia, CHF, lower abdominal and right shoulder pain, 2 days post cholecystectomy, history hypertension, CHF, atrial fibrillation, diabetes  PORTABLE CHEST - 1 VIEW  Comparison: Portable exam 1100 hours compared to 12/02/2009  Findings: Enlargement of cardiac silhouette with pulmonary vascular congestion. Atherosclerotic calcification aorta. Minimal bibasilar atelectasis. Lungs otherwise clear. Bones demineralized. No pleural effusion or pneumothorax.  IMPRESSION: Enlargement of cardiac silhouette with pulmonary vascular congestion. Minimal bibasilar atelectasis.   Original Report Authenticated By: Lollie Marrow, M.D.    Dg Abd Acute W/chest  05/15/2012  *RADIOLOGY REPORT*  Clinical Data: Tympanitic abdomen.  Postoperative abdomen. Hypoactive bowel sounds.  Abdominal pain.  ACUTE ABDOMEN SERIES (ABDOMEN 2 VIEW & CHEST 1 VIEW)  Comparison: 05/15/2012 chest radiograph.  Findings: Low lung volumes are present with basilar atelectasis. Cardiopericardial silhouette is unchanged, likely within normal limits allowing for volumes of inspiration.  Cholecystectomy clips are present in the right upper quadrant.  No free air is identified in the  abdomen.  Small and large bowel dilation is present with scattered air fluid levels most compatible with ileus in the postoperative setting.  Paucity of rectal gas. Gas and stool is present along the descending colon.  Bilateral right greater than left hip osteoarthritis is incidentally noted. Right sacroiliac joint degenerative disease.  IMPRESSION:  1.  Low volume chest. 2.  Cholecystectomy. 3.  Bowel gas pattern compatible with ileus.   Original Report Authenticated By: Andreas Newport, M.D.    Dg Abd Portable 1v  05/16/2012  *RADIOLOGY REPORT*  Clinical Data: Abdominal pain and distension, recent surgery.  PORTABLE ABDOMEN - 1 VIEW  Comparison: 05/15/2012  Findings: Hemidiaphragms/upper abdomen excluded from the images. Distended loops of large and small bowel again noted.  Surgical clips right upper quadrant.  The organ outlines normal where seen. No acute osseous finding.  IMPRESSION: Gaseous distension of loops of large and small bowel, similar to prior.   Original Report Authenticated By: Waneta Martins, M.D.     Medications: I have reviewed the patient's current medications. Scheduled Meds:    . ciprofloxacin  200 mg Intravenous Q24H  . insulin aspart  0-9 Units Subcutaneous Q4H  . iohexol  20 mL Oral Q1 Hr x 2  . levothyroxine  26 mcg Intravenous QAC breakfast  . metronidazole  500 mg Intravenous Q8H  . sodium chloride  3 mL Intravenous Q12H  . DISCONTD: diltiazem  360 mg Oral Daily  . DISCONTD: levothyroxine  50 mcg Oral Q breakfast   Continuous Infusions:    . sodium chloride 175 mL/hr at 05/16/12 0045  . diltiazem (CARDIZEM) infusion 10 mg/hr (05/16/12 1000)   PRN Meds:.diphenhydrAMINE, diphenhydrAMINE, hydrALAZINE, labetalol, morphine injection, ondansetron (ZOFRAN) IV, ondansetron, oxyCODONE-acetaminophen  Assessment/Plan: Principal Problem:  *Respiratory distress Active Problems:  History of colon cancer, stage III  Cholecystitis chronic, acute, S/P lap  cholecystectomy 05/14/13  Thoracic aortic aneurysm, 4.8cm 2011  CHF, past NICM with an EF of 30%, most recent echo 2011 showed EF to be45- 50%  Chronic atrial fibrillation, not felt to be a Coumadin candidate in the past  Acute on chronic renal insufficiency  Normal coronary arteries, 2008  Diabetes mellitus  HTN (hypertension)  ARF (acute renal failure)  Atrial fibrillation with RVR  Postoperative  ileus  Small bowel obstruction due to adhesions  PLAN: have reviewed notes of last 24 hours.  Pt just back form CT afib RVR (pt has chronic A fib), on dilt at 10 /hr  Was on po cardizem at 360 at home, would continue IV cardizem until taking po, If HR increases could add low dose IV lopressor. + SM. Bowel obstruction on CT ; CXR without evidence of CHF NG with a large amount of drainage. Small pl. Effusions per CT- monitor for HF/ volume overload with renal failure and EF 45-50% per echo 2011    Renal following for acute kidney injury.    LOS: 4 days   Nicole [name],Nicole [name] 05/16/2012, 12:24 PM  I seen and examined the patient this afternoon along with Nada Boozer, NP. I agree with her findings, examination, impression and recommendations.  78 year old woman with a history for likely diastolic and mild systolic dysfunction. Followed by Dr. Clarene Duke for chronic atrial fibrillation who was admitted for microscopic cholecystectomy. We are consult to help with management of atrial fibrillation.  Cholecystectomy for cardiac discomfort in significant NG tube loss as demonstrated by her low potassium. She has no signs of active heart failure.  She has had a relatively significant increase in creatinine suggestive of acute renal failure but is still making urine. He would expect this to turn-normal.  And her BNP is elevated 2000 which could explain some of the tachycardia.  Her home diltiazem 360 mg which she is not quite getting At her current IV dose -- would increase to 15 mg per hour with 1 additional  10mg  bolus (had total of 20mg  bolus this AM). If additional heart rate control is required would use IV Lopressor starting 2.5 mg every 6 hours as blood pressure tolerates. We'll be reluctant to use digoxin in the setting due to the renal insufficiency.  He'll follow patient along. Anticipate improved heart rate once her discomfort is resolved.  Marykay Lex, M.D., M.S. [facility] [address] [place], [place]  [address]  [phone] Pager # [phone] 05/16/2012 1:06 PM

## 2012-05-16 NOTE — Progress Notes (Signed)
Informed by Dr Chalmers Guest leak + on HIDA scan, d/w Dr Jamey Ripa at bedside, who then recommended  ERCP and stent placement. Then spoke with Dr Everlean Patterson will evaluate.

## 2012-05-16 NOTE — Progress Notes (Signed)
PT Cancellation Note  Patient Details Name: Nicole [name] MRN: [id number] DOB: [birth year]   Cancelled Treatment:    Reason Eval/Treat Not Completed: Patient not medically ready (Pt with severe abdominal distention with NGT just placed.) Spoke with RN and will hold PT for today. Will follow as able.   Maverick Patman M 05/16/2012, 9:17 AM  05/16/2012 Cephus Shelling, PT, DPT [phone]

## 2012-05-16 NOTE — Progress Notes (Signed)
2 Days Post-Op   Assessment: s/p Procedure(s): LAPAROSCOPIC CHOLECYSTECTOMY WITH INTRAOPERATIVE CHOLANGIOGRAM Patient Active Problem List  Diagnosis  . Anemia  . History of colon cancer, stage III  . Abdominal pain, acute, right upper quadrant  . Nausea and vomiting  . Dehydration  . Cholecystitis chronic, acute, S/P lap cholecystectomy 05/14/13  . Thoracic aortic aneurysm, 4.8cm 2011  . CHF, past NICM with an EF of 30%, most recent echo 2011 showed EF to be45- 50%  . Chronic atrial fibrillation, not felt to be a Coumadin candidate in the past  . Acute on chronic renal insufficiency  . Normal coronary arteries, 2008  . Diabetes mellitus  . HTN (hypertension)  . Respiratory distress  . ARF (acute renal failure)    Not doing well, ? Of abd complication after lap chole - she had a number of adhesions and could have a bowel injury  AF with rapid pulse now Plan: Will get CT abd now -   Subjective: More abd distention and pain. Some nausea, basically very uncomfortable  Objective: Vital signs in last 24 hours: Temp:  [97.2 F (36.2 C)-98.7 F (37.1 C)] 97.4 F (36.3 C) (10/25 0400) Pulse Rate:  [80-124] 124  (10/25 0400) Resp:  [18-28] 28  (10/25 0400) BP: (80-177)/(49-101) 153/99 mmHg (10/25 0400) SpO2:  [89 %-98 %] 96 % (10/25 0400) Weight:  [185 lb 3 oz (84 kg)] 185 lb 3 oz (84 kg) (10/25 0400)   Intake/Output from previous day: 10/24 0701 - 10/25 0700 In: 865.5 [P.O.:170; I.V.:695.5] Out: 400 [Urine:400] Intake/Output this shift: Total I/O In: 575 [P.O.:50; I.V.:525] Out: 400 [Urine:400]   General appearance: alert, fatigued and moderate distress Resp: clear to auscultation bilaterally and dyspneic Cardio: irregularly irregular rhythm GI: Much more distended than last night somewhat soft, somewhat tender thru out, no BS  Incision: Less drainage  Lab Results:   Basename 05/16/12 0143 05/15/12 2317 05/15/12 1114  WBC 15.4* -- 9.5  HGB 11.9* 11.6* --  HCT  33.5* 33.2* --  PLT 247 -- 252   BMET  Basename 05/15/12 1114 05/15/12 0610  NA 138 140  K 3.9 4.2  CL 100 102  CO2 25 26  GLUCOSE 178* 158*  BUN 24* 21  CREATININE 2.81* 2.33*  CALCIUM 8.3* 8.2*   PT/INR No results found for this basename: LABPROT:2,INR:2 in the last 72 hours ABG  Basename 05/16/12 0253  PHART 7.438  HCO3 25.4*    MEDS, Scheduled    . diltiazem  360 mg Oral Daily  . insulin aspart  0-9 Units Subcutaneous Q4H  . levothyroxine  50 mcg Oral Q breakfast  . sodium chloride  3 mL Intravenous Q12H  . DISCONTD: amLODipine  10 mg Oral Daily  . DISCONTD: diltiazem  360 mg Oral Daily  . DISCONTD: heparin  5,000 Units Subcutaneous Q8H    Studies/Results: Dg Cholangiogram Operative  05/14/2012  *RADIOLOGY REPORT*  Clinical Data: Laparoscopic cholecystectomy  INTRAOPERATIVE CHOLANGIOGRAM  Comparison:  Abdominal ultrasound - 05/12/2012  Findings:  Intraoperative angiographic images of the right upper abdominal quadrant during laparoscopic cholecystectomy are provided for review.  Surgical clips overlie the expected location of the gallbladder fossa.  Contrast injection demonstrates selective cannulation of the central aspect of the cystic duct.  There is brisk passage of contrast through the central aspect of the cystic duct with filling of a mildly dilated common bile duct. There is brisk passage of contrast though the CBD and into the descending portion of the duodenum.  There  is minimal reflux of injected contrast into the common hepatic duct and central aspect of the nondilated intrahepatic biliary system.  There are no discrete filling defects within the opacified portions of the biliary system to suggest the presence of choledocholithiasis.  IMPRESSION:  Intraoperative cholangiogram as above.  No discrete filling defects to suggest the presence of choledocholithiasis.   Original Report Authenticated By: Waynard Reeds, M.D.    US Renal  05/15/2012  *RADIOLOGY  REPORT*  Clinical Data: Acute renal failure  RENAL/URINARY TRACT ULTRASOUND COMPLETE  Comparison:  Abdominal ultrasound 05/12/2012, 11/08/2006 CT  Findings:  Right Kidney:  10.5 cm. No hydronephrosis or focal mass.  Left Kidney:  10.4 cm. No hydronephrosis or focal mass.  Bladder:  Normal in appearance.  Ascites is noted.  IMPRESSION: Normal exam.  No hydronephrosis or solid renal mass.  Ascites.   Original Report Authenticated By: Harrel Lemon, M.D.    Dg Chest Port 1 View  05/15/2012  *RADIOLOGY REPORT*  Clinical Data: Hypoxia, CHF, lower abdominal and right shoulder pain, 2 days post cholecystectomy, history hypertension, CHF, atrial fibrillation, diabetes  PORTABLE CHEST - 1 VIEW  Comparison: Portable exam 1100 hours compared to 12/02/2009  Findings: Enlargement of cardiac silhouette with pulmonary vascular congestion. Atherosclerotic calcification aorta. Minimal bibasilar atelectasis. Lungs otherwise clear. Bones demineralized. No pleural effusion or pneumothorax.  IMPRESSION: Enlargement of cardiac silhouette with pulmonary vascular congestion. Minimal bibasilar atelectasis.   Original Report Authenticated By: Lollie Marrow, M.D.    Dg Abd Acute W/chest  05/15/2012  *RADIOLOGY REPORT*  Clinical Data: Tympanitic abdomen.  Postoperative abdomen. Hypoactive bowel sounds.  Abdominal pain.  ACUTE ABDOMEN SERIES (ABDOMEN 2 VIEW & CHEST 1 VIEW)  Comparison: 05/15/2012 chest radiograph.  Findings: Low lung volumes are present with basilar atelectasis. Cardiopericardial silhouette is unchanged, likely within normal limits allowing for volumes of inspiration.  Cholecystectomy clips are present in the right upper quadrant.  No free air is identified in the abdomen.  Small and large bowel dilation is present with scattered air fluid levels most compatible with ileus in the postoperative setting.  Paucity of rectal gas. Gas and stool is present along the descending colon.  Bilateral right greater than left hip  osteoarthritis is incidentally noted. Right sacroiliac joint degenerative disease.  IMPRESSION:  1.  Low volume chest. 2.  Cholecystectomy. 3.  Bowel gas pattern compatible with ileus.   Original Report Authenticated By: Andreas Newport, M.D.    Dg Abd Portable 1v  05/16/2012  *RADIOLOGY REPORT*  Clinical Data: Abdominal pain and distension, recent surgery.  PORTABLE ABDOMEN - 1 VIEW  Comparison: 05/15/2012  Findings: Hemidiaphragms/upper abdomen excluded from the images. Distended loops of large and small bowel again noted.  Surgical clips right upper quadrant.  The organ outlines normal where seen. No acute osseous finding.  IMPRESSION: Gaseous distension of loops of large and small bowel, similar to prior.   Original Report Authenticated By: Waneta Martins, M.D.       LOS: 4 days     Currie Paris, MD, [facility], [place] [phone]   05/16/2012 6:45 AM

## 2012-05-16 NOTE — Progress Notes (Signed)
[name]  Subjective:  Awake, vomited just before I came in room.  Better now.  Less abd pain than on admission IV NS at 175/hr Developed Afib/RVR earlier today   Objective: Vital signs in last 24 hours: Blood pressure 149/84, pulse 124, temperature 97.9 F (36.6 C), temperature source Oral, resp. rate 28, height 5\' 10"  (1.778 m), weight 84 kg (185 lb 3 oz), SpO2 96.00%.    PHYSICAL EXAM General--as above Chest--clear Heart--no rub Abd--distended, tender, no rebound Extr--no edema  Lab Results:   Lab 05/16/12 0700 05/15/12 1114 05/15/12 0610  NA 141 138 140  K 3.8 3.9 4.2  CL 100 100 102  CO2 24 25 26   BUN 35* 24* 21  CREATININE 3.36* 2.81* 2.33*  ALB -- -- --  GLUCOSE 189* -- --  CALCIUM 8.7 8.3* 8.2*  PHOS -- -- --     Basename 05/16/12 0700 05/16/12 0143  WBC 13.9* 15.4*  HGB 12.3 11.9*  HCT 34.7* 33.5*  PLT 281 247     I have reviewed the patient's current medications. Scheduled: Continuous:  Assessment/Plan: No hydro on [place].  I/O 1040/400 last 24 hr   1.   Acute Kidney Injury, hemodynamically mediated --Cr continues to rise.  No hydro on [place].  I/O 1040/400 last 24 hr.  IV NS at 175/hr--primarily to replace NG losses  Need to watch IVF so she doesn't become vol overloaded 2.   Severe systemic arterial hypertension --not that high currently 3.   Hypovolemia due to gastrointestinal losses--receiving IVF  4.   Post op Cholecystectomy--going for CT now; on cipro + flagyl 5.  Afib/RVR--on cardizem drip,  No heparin     LOS: 4 days   [name] 05/16/2012,10:48 AM   .labalb

## 2012-05-16 NOTE — Progress Notes (Signed)
Pt down to CT

## 2012-05-16 NOTE — Progress Notes (Signed)
Pt back from CT, VSS.

## 2012-05-16 NOTE — Consult Note (Signed)
Referring Provider: Dr. Jamey Ripa Primary Care Physician:  Bufford Spikes, DO Primary Gastroenterologist:  Dr. Matthias Hughs  Reason for Consultation:  Bile Leak  HPI: Nicole [name] is a 78 y.o. female s/p lap chole 2 days ago who developed severe abdominal pain, nausea, and vomiting yesterday. HIDA scan showed bile leak. Her Afib went into RVR this morning with HR in 120's and she was started on a Cardizem drip, which has controlled her rate to the 90's and low 100's. Reports that pain is not nearly as bad today as it was yesterday. Denies any N/V today. CT shows small bowel obstruction with transition point not being identified. NG in place. She had a hemicolectomy in 1999 for colon cancer and reports that her last colonoscopy was 3 months ago by Dr. Matthias Hughs. Sister, who is a retired Engineer, civil (consulting) is at bedside.  Past Medical History  Diagnosis Date  . Colon cancer 07/1997  . Hypertension   . Diabetes mellitus   . CHF (congestive heart failure)   . AAA (abdominal aortic aneurysm)   . Atrial fibrillation   . Hypercholesteremia   . Kidney stone     renal calculi  . Small bowel obstruction due to adhesions 05/16/2012    Past Surgical History  Procedure Date  . Hemicolectomy 08/11/1997  . Kidney stone surgery   . Colon surgery     to remove colon ca  . Cholecystectomy 05/14/2012    Procedure: LAPAROSCOPIC CHOLECYSTECTOMY WITH INTRAOPERATIVE CHOLANGIOGRAM;  Surgeon: Currie Paris, MD;  Location: [facility] OR;  Service: General;  Laterality: N/A;    Prior to Admission medications   Medication Sig Start Date End Date Taking? Authorizing Provider  allopurinol (ZYLOPRIM) 100 MG tablet Take 100 mg by mouth as needed. For gout   Yes Historical Provider, MD  amLODipine (NORVASC) 10 MG tablet Take 10 mg by mouth daily.     Yes Historical Provider, MD  atorvastatin (LIPITOR) 40 MG tablet Take 40 mg by mouth daily.     Yes Historical Provider, MD  diltiazem (CARDIZEM CD) 360 MG 24 hr capsule Take 360 mg by  mouth daily.     Yes Historical Provider, MD  furosemide (LASIX) 40 MG tablet Take 40 mg by mouth daily.   Yes Historical Provider, MD  glipiZIDE (GLUCOTROL) 10 MG tablet Take 10 mg by mouth daily.     Yes Historical Provider, MD  levothyroxine (SYNTHROID, LEVOTHROID) 50 MCG tablet Take 50 mcg by mouth daily.     Yes Historical Provider, MD  magnesium oxide (MAG-OX) 400 MG tablet Take 400 mg by mouth daily.   Yes Historical Provider, MD  metFORMIN (GLUCOPHAGE) 500 MG tablet Take 500 mg by mouth 2 (two) times daily with a meal.     Yes Historical Provider, MD  potassium chloride (K-DUR) 10 MEQ tablet Take 10 mEq by mouth daily.     Yes Historical Provider, MD    Scheduled Meds:   . ciprofloxacin  200 mg Intravenous Q24H  . diltiazem  10 mg Intravenous Once  . insulin aspart  0-9 Units Subcutaneous Q4H  . iohexol  20 mL Oral Q1 Hr x 2  . levothyroxine  26 mcg Intravenous QAC breakfast  . metronidazole  500 mg Intravenous Q8H  . sodium chloride  500 mL Intravenous Once  . sodium chloride  3 mL Intravenous Q12H  . DISCONTD: diltiazem  360 mg Oral Daily  . DISCONTD: diltiazem  10 mg Intravenous Once  . DISCONTD: levothyroxine  50 mcg Oral Q  breakfast   Continuous Infusions:   . sodium chloride 175 mL/hr at 05/16/12 0045  . diltiazem (CARDIZEM) infusion 15 mg/hr (05/16/12 1200)   PRN Meds:.diphenhydrAMINE, diphenhydrAMINE, hydrALAZINE, labetalol, morphine injection, ondansetron (ZOFRAN) IV, ondansetron, oxyCODONE-acetaminophen, technetium TC 6M mebrofenin  Allergies as of 05/12/2012 - Review Complete 05/12/2012  Allergen Reaction Noted  . Iohexol  12/26/2008    History reviewed. No pertinent family history.  History   Social History  . Marital Status: Widowed    Spouse Name: N/A    Number of Children: N/A  . Years of Education: N/A   Occupational History  . Not on file.   Social History Main Topics  . Smoking status: Never Smoker   . Smokeless tobacco: Never Used  .  Alcohol Use: No  . Drug Use: No  . Sexually Active:    Other Topics Concern  . Not on file   Social History Narrative  . No narrative on file    Review of Systems: Negative from a GI standpoint except as stated above  Physical Exam: Vital signs: Filed Vitals:   05/16/12 1707  BP: 141/78  Pulse: 104  Temp: 98.5 F (36.9 C)  Resp: 25   Last BM Date: 05/10/12 General:   Alert,  Well-developed, well-nourished, pleasant and cooperative, no acute distress, NG tube in place HEENT: anicteric, oropharynx clear Neck: supple, NT Lungs:  Clear throughout to auscultation anteriorly.   No wheezes, crackles, or rhonchi. No acute distress. Heart: Irregularly irregular rate and rhythm Abdomen: epigastric and RUQ tenderness, distended, lap port site noted in epigastric region, surgical dressing not removed  Rectal:  Deferred Ext: no edema  GI:  Lab Results:  Basename 05/16/12 0700 05/16/12 0143 05/15/12 2317 05/15/12 1114  WBC 13.9* 15.4* -- 9.5  HGB 12.3 11.9* 11.6* --  HCT 34.7* 33.5* 33.2* --  PLT 281 247 -- 252   BMET  Basename 05/16/12 0700 05/15/12 1114 05/15/12 0610  NA 141 138 140  K 3.8 3.9 4.2  CL 100 100 102  CO2 24 25 26   GLUCOSE 189* 178* 158*  BUN 35* 24* 21  CREATININE 3.36* 2.81* 2.33*  CALCIUM 8.7 8.3* 8.2*   LFT No results found for this basename: PROT,ALBUMIN,AST,ALT,ALKPHOS,BILITOT,BILIDIR,IBILI in the last 72 hours PT/INR No results found for this basename: LABPROT:2,INR:2 in the last 72 hours   Studies/Results: Ct Abdomen Pelvis Wo Contrast  05/16/2012  *RADIOLOGY REPORT*  Clinical Data: Abdominal pain and distention.  Status post cholecystectomy 05/12/2012.  CT ABDOMEN AND PELVIS WITHOUT CONTRAST  Technique:  Multidetector CT imaging of the abdomen and pelvis was performed following the standard protocol without intravenous contrast.  Comparison: CT chest and abdomen 11/08/2006 and CT abdomen and pelvis 02/12/2006.  Findings: The patient has small  bilateral pleural effusions, larger on the right.  There is associated basilar atelectasis, also worse on the right.  Heart size is enlarged.  No pericardial effusion.  NG tube is in place.  The patient has a small bowel obstruction with dilatation of small bowel loops up to 4.4 cm and extensive stool within small bowel.  Distal small bowel loops are completely decompressed.  Transition point is not identified. No pneumatosis or portal venous gas is present.  Small amount of free intraperitoneal air is seen about the liver and likely due to postoperative change.  There is a small amount of perihepatic and free pelvic fluid with increased attenuation consistent with the presence of hemorrhage. Fluid in the pelvis is Hounsfield unit measurements of 36.5.  No focal liver lesions is seen on uninfused examination.  The patient appears to be status post splenectomy with splenosis noted in the left upper quadrant.  The adrenal glands, kidneys and pancreas appear normal.  Atherosclerotic vascular disease without aneurysm of the aorta is identified.  There is no lymphadenopathy. No focal bony abnormality.  IMPRESSION:  1.  Findings consistent with small bowel obstruction likely due to adhesions.  No mass or evidence of bowel ischemia is present on this uninfused study. 2.  Small to moderate volume of perihepatic and pelvic hemorrhage. Some of the fluid about the liver could be bowel although this is felt less likely. 3.  Status post splenectomy. 4.  Small bilateral pleural effusions.   Original Report Authenticated By: Bernadene Bell. Maricela Curet, M.D.    Nm Hepatobiliary  05/16/2012  *RADIOLOGY REPORT*  Clinical Data:  Postop microscopic cholecystectomy.  Question bile leak.  NUCLEAR MEDICINE HEPATOBILIARY IMAGING  Technique:  Sequential images of the abdomen were obtained out to 60 minutes following intravenous administration of radiopharmaceutical.  Radiopharmaceutical:  5.65mCi Tc-65m Choletec  Comparison:  CT 05/16/2012   Findings: There is rapid clearance of radiotracer from the pool and homogeneous uptake within the liver.  Initially,  bile flows into the small bowel through the common bile duct.  During the lateral part of the first hour of imaging and into the second hour of imaging, radiotracer is noted along the inferior margin of the right hepatic lobe.  Subsequently this radiotracer descends along the right pericolic gutter and begins to outline the lateral margin of the liver.  These findings are consistent with a bile leak.  IMPRESSION:  1.  Bile leak centered along the inferior right hepatic margin and spilling into aathe right pericolic gutter.  2.  Bile  does flow through the common bile duct  into the small bowel.  Findings conveyed to Dr. Jamey Ripa on 05/16/2012 at this 1650 hours   Original Report Authenticated By: Genevive Bi, M.D.      Impression/Plan: 78 yo with lap chole on 05/14/12 who developed renal failure, Afib and RVR and had worsened abdominal pain/N/V who had a HIDA scan showing a bile leak. Needs an ERCP with stent placement. CT shows SBO but pt needs ERCP to try and close this leak. Continue antibiotics. IVFs. Risks/benefits discussed with patient and her sister and she agrees to the procedure. Will obtain formal consent prior to the procedure. Dr. Madilyn Fireman to do in OR tomorrow at [facility]. Will check LFTs in AM pre-procedure.    LOS: 4 days   Makiah Clauson C.  05/16/2012, 7:12 PM

## 2012-05-17 ENCOUNTER — Encounter (HOSPITAL_COMMUNITY): Payer: Self-pay | Admitting: Certified Registered"

## 2012-05-17 ENCOUNTER — Inpatient Hospital Stay (HOSPITAL_COMMUNITY): Payer: Medicare Other | Admitting: Certified Registered"

## 2012-05-17 ENCOUNTER — Inpatient Hospital Stay (HOSPITAL_COMMUNITY): Payer: Medicare Other

## 2012-05-17 ENCOUNTER — Encounter (HOSPITAL_COMMUNITY)
Admission: EM | Disposition: A | Discharge status: Home Health | Payer: Self-pay | Source: Home / Self Care | Attending: Internal Medicine

## 2012-05-17 ENCOUNTER — Encounter (HOSPITAL_COMMUNITY): Payer: Self-pay | Admitting: *Deleted

## 2012-05-17 DIAGNOSIS — K838 Other specified diseases of biliary tract: Secondary | ICD-10-CM | POA: Insufficient documentation

## 2012-05-17 DIAGNOSIS — K812 Acute cholecystitis with chronic cholecystitis: Secondary | ICD-10-CM

## 2012-05-17 DIAGNOSIS — E876 Hypokalemia: Secondary | ICD-10-CM | POA: Insufficient documentation

## 2012-05-17 HISTORY — PX: ERCP: SHX5425

## 2012-05-17 LAB — GLUCOSE, CAPILLARY
Glucose-Capillary: 115 mg/dL — ABNORMAL HIGH (ref 70–99)
Glucose-Capillary: 130 mg/dL — ABNORMAL HIGH (ref 70–99)
Glucose-Capillary: 137 mg/dL — ABNORMAL HIGH (ref 70–99)
Glucose-Capillary: 146 mg/dL — ABNORMAL HIGH (ref 70–99)

## 2012-05-17 LAB — CBC
HCT: 27.4 % — ABNORMAL LOW (ref 36.0–46.0)
Hemoglobin: 9.9 g/dL — ABNORMAL LOW (ref 12.0–15.0)
MCH: 29.6 pg (ref 26.0–34.0)
MCHC: 36.1 g/dL — ABNORMAL HIGH (ref 30.0–36.0)

## 2012-05-17 LAB — HEPATIC FUNCTION PANEL
AST: 20 U/L (ref 0–37)
Albumin: 2.4 g/dL — ABNORMAL LOW (ref 3.5–5.2)
Alkaline Phosphatase: 66 U/L (ref 39–117)
Total Protein: 5.7 g/dL — ABNORMAL LOW (ref 6.0–8.3)

## 2012-05-17 LAB — BASIC METABOLIC PANEL
BUN: 43 mg/dL — ABNORMAL HIGH (ref 6–23)
Calcium: 7.7 mg/dL — ABNORMAL LOW (ref 8.4–10.5)
GFR calc non Af Amer: 14 mL/min — ABNORMAL LOW (ref >90)
Glucose, Bld: 141 mg/dL — ABNORMAL HIGH (ref 70–99)

## 2012-05-17 SURGERY — ERCP, WITH INTERVENTION IF INDICATED
Anesthesia: General

## 2012-05-17 MED ORDER — LIDOCAINE HCL (CARDIAC) 20 MG/ML IV SOLN
INTRAVENOUS | Status: DC | PRN
  Administered 2012-05-17: 30 mg via INTRAVENOUS

## 2012-05-17 MED ORDER — SODIUM CHLORIDE 0.9 % IV SOLN
10.0000 mg | INTRAVENOUS | Status: DC | PRN
  Administered 2012-05-17: 120 ug/min via INTRAVENOUS

## 2012-05-17 MED ORDER — SUCCINYLCHOLINE CHLORIDE 20 MG/ML IJ SOLN
INTRAMUSCULAR | Status: DC | PRN
  Administered 2012-05-17: 100 mg via INTRAVENOUS

## 2012-05-17 MED ORDER — GLYCOPYRROLATE 0.2 MG/ML IJ SOLN
INTRAMUSCULAR | Status: DC | PRN
  Administered 2012-05-17: 0.4 mg via INTRAVENOUS

## 2012-05-17 MED ORDER — ONDANSETRON HCL 4 MG/2ML IJ SOLN: 4.0000 mg | Freq: Once | INTRAMUSCULAR | Status: DC | PRN

## 2012-05-17 MED ORDER — MIDAZOLAM HCL 5 MG/5ML IJ SOLN
INTRAMUSCULAR | Status: DC | PRN
  Administered 2012-05-17: 1 mg via INTRAVENOUS

## 2012-05-17 MED ORDER — HYDROMORPHONE HCL PF 1 MG/ML IJ SOLN: 0.2500 mg | INTRAMUSCULAR | Status: DC | PRN

## 2012-05-17 MED ORDER — ROCURONIUM BROMIDE 100 MG/10ML IV SOLN
INTRAVENOUS | Status: DC | PRN
  Administered 2012-05-17: 20 mg via INTRAVENOUS
  Administered 2012-05-17: 10 mg via INTRAVENOUS

## 2012-05-17 MED ORDER — PHENOL 1.4 % MT LIQD
1.0000 | OROMUCOSAL | Status: DC | PRN
  Filled 2012-05-17: qty 177

## 2012-05-17 MED ORDER — SODIUM CHLORIDE 0.45 % IV SOLN
INTRAVENOUS | Status: DC
  Administered 2012-05-17 – 2012-05-18 (×2): via INTRAVENOUS

## 2012-05-17 MED ORDER — PROPOFOL 10 MG/ML IV BOLUS
INTRAVENOUS | Status: DC | PRN
  Administered 2012-05-17: 80 mg via INTRAVENOUS

## 2012-05-17 MED ORDER — NEOSTIGMINE METHYLSULFATE 1 MG/ML IJ SOLN
INTRAMUSCULAR | Status: DC | PRN
  Administered 2012-05-17: 3 mg via INTRAVENOUS

## 2012-05-17 MED ORDER — PHENYLEPHRINE HCL 10 MG/ML IJ SOLN
INTRAMUSCULAR | Status: DC | PRN
  Administered 2012-05-17 (×5): 80 ug via INTRAVENOUS

## 2012-05-17 MED ORDER — LACTATED RINGERS IV SOLN
INTRAVENOUS | Status: DC | PRN
  Administered 2012-05-17 (×2): via INTRAVENOUS

## 2012-05-17 MED ORDER — FENTANYL CITRATE 0.05 MG/ML IJ SOLN
INTRAMUSCULAR | Status: DC | PRN
  Administered 2012-05-17: 100 ug via INTRAVENOUS

## 2012-05-17 MED ORDER — ACETAMINOPHEN 10 MG/ML IV SOLN: 1000.0000 mg | Freq: Once | INTRAVENOUS | Status: DC | PRN

## 2012-05-17 MED ORDER — ONDANSETRON HCL 4 MG/2ML IJ SOLN
INTRAMUSCULAR | Status: DC | PRN
  Administered 2012-05-17: 4 mg via INTRAVENOUS

## 2012-05-17 MED ORDER — POTASSIUM CHLORIDE 10 MEQ/100ML IV SOLN
10.0000 meq | INTRAVENOUS | Status: AC
  Administered 2012-05-17 (×2): 10 meq via INTRAVENOUS
  Filled 2012-05-17: qty 200
  Filled 2012-05-17 (×2): qty 100

## 2012-05-17 MED ORDER — IOHEXOL 350 MG/ML SOLN
INTRAVENOUS | Status: DC | PRN
  Administered 2012-05-17: 50 mL via INTRAVENOUS

## 2012-05-17 NOTE — Anesthesia Preprocedure Evaluation (Addendum)
Anesthesia Evaluation  Patient identified by MRN, date of birth, ID band Patient awake    Reviewed: Allergy & Precautions, H&P , NPO status , Patient's Chart, lab work & pertinent test results  Airway Mallampati: II TM Distance: >3 FB   Mouth opening: Limited Mouth Opening  Dental  (+) Edentulous Upper, Partial Lower and Dental Advisory Given   Pulmonary    + decreased breath sounds      Cardiovascular hypertension, Pt. on medications +CHF + dysrhythmias Atrial Fibrillation Rhythm:Irregular Rate:Normal     Neuro/Psych    GI/Hepatic   Endo/Other  diabetes, Well Controlled, Type 2, Oral Hypoglycemic Agents  Renal/GU      Musculoskeletal   Abdominal   Peds  Hematology   Anesthesia Other Findings   Reproductive/Obstetrics                          Anesthesia Physical Anesthesia Plan  ASA: III  Anesthesia Plan: General   Post-op Pain Management:    Induction: Intravenous  Airway Management Planned: Oral ETT  Additional Equipment:   Intra-op Plan:   Post-operative Plan: Extubation in OR  Informed Consent: I have reviewed the patients History and Physical, chart, labs and discussed the procedure including the risks, benefits and alternatives for the proposed anesthesia with the patient or authorized representative who has indicated his/her understanding and acceptance.   Dental advisory given  Plan Discussed with: CRNA, Anesthesiologist and Surgeon  Anesthesia Plan Comments: (S/P Lap Cholecystectomy 05/14/13 with bile leak Chronic Afib now on cardiazem infusion for RVR Acute renal insufficiency Cr 3.01 htn Post-op ileus No known H/O CAD EF 45% by Echo 12/04/09  Plan GA with oral ETT  Kipp Brood, MD)      Anesthesia Quick Evaluation

## 2012-05-17 NOTE — Progress Notes (Signed)
3 Days Post-Op   Assessment: s/p Procedure(s): LAPAROSCOPIC CHOLECYSTECTOMY WITH INTRAOPERATIVE CHOLANGIOGRAM Patient Active Problem List  Diagnosis  . Anemia  . History of colon cancer, stage III  . Abdominal pain, acute, right upper quadrant  . Nausea and vomiting  . Dehydration  . Cholecystitis chronic, acute, S/P lap cholecystectomy 05/14/13  . Thoracic aortic aneurysm, 4.8cm 2011  . CHF, past NICM with an EF of 30%, most recent echo 2011 showed EF to be45- 50%  . Chronic atrial fibrillation, not felt to be a Coumadin candidate in the past  . Acute on chronic renal insufficiency  . Normal coronary arteries, 2008  . Diabetes mellitus  . HTN (hypertension)  . Respiratory distress  . ARF (acute renal failure)  . Atrial fibrillation with RVR  . Postoperative ileus  . Small bowel obstruction due to adhesions  . Bile leak, postoperative  . Hypokalemia    Bile leak Hypokalemia Post op bleed, stable Renal insufficiency, improved Clinically improved  Plan: For ERCP today hopefully bile leak will stop. Will need f/u abd xrays because of suggestion of SBO on ct scan - will do tomorrow  Subjective: Feels much better, pain nearly resolved  Objective: Vital signs in last 24 hours: Temp:  [97.9 F (36.6 C)-98.5 F (36.9 C)] 98.2 F (36.8 C) (10/26 0000) Pulse Rate:  [97] 97  (10/25 2000) Resp:  [21] 21  (10/25 2000) BP: (130-149)/(68-84) 130/68 mmHg (10/25 2000) SpO2:  [94 %-98 %] 94 % (10/25 2000)   Intake/Output from previous day: 10/25 0701 - 10/26 0700 In: 275 [I.V.:175; IV Piggyback:100] Out: 6550 [Urine:600; Emesis/NG output:5950] Intake/Output this shift:     General appearance: alert, cooperative and no distress GI: Softer and less tender than yesterday, still tender RUQ  Incision: No additional bleeding  Lab Results:   Basename 05/17/12 0555 05/16/12 0700  WBC 13.7* 13.9*  HGB 9.9* 12.3  HCT 27.4* 34.7*  PLT 266 281   BMET  Basename 05/17/12  0555 05/16/12 0700  NA 146* 141  K 3.1* 3.8  CL 104 100  CO2 32 24  GLUCOSE 141* 189*  BUN 43* 35*  CREATININE 3.01* 3.36*  CALCIUM 7.7* 8.7   PT/INR No results found for this basename: LABPROT:2,INR:2 in the last 72 hours ABG  Basename 05/16/12 0253  PHART 7.438  HCO3 25.4*    MEDS, Scheduled    . ciprofloxacin  200 mg Intravenous Q24H  . diltiazem  10 mg Intravenous Once  . insulin aspart  0-9 Units Subcutaneous Q4H  . iohexol  20 mL Oral Q1 Hr x 2  . levothyroxine  26 mcg Intravenous QAC breakfast  . metronidazole  500 mg Intravenous Q8H  . sodium chloride  500 mL Intravenous Once  . sodium chloride  3 mL Intravenous Q12H  . DISCONTD: diltiazem  10 mg Intravenous Once    Studies/Results: Ct Abdomen Pelvis Wo Contrast  05/16/2012  *RADIOLOGY REPORT*  Clinical Data: Abdominal pain and distention.  Status post cholecystectomy 05/12/2012.  CT ABDOMEN AND PELVIS WITHOUT CONTRAST  Technique:  Multidetector CT imaging of the abdomen and pelvis was performed following the standard protocol without intravenous contrast.  Comparison: CT chest and abdomen 11/08/2006 and CT abdomen and pelvis 02/12/2006.  Findings: The patient has small bilateral pleural effusions, larger on the right.  There is associated basilar atelectasis, also worse on the right.  Heart size is enlarged.  No pericardial effusion.  NG tube is in place.  The patient has a small bowel obstruction   with dilatation of small bowel loops up to 4.4 cm and extensive stool within small bowel.  Distal small bowel loops are completely decompressed.  Transition point is not identified. No pneumatosis or portal venous gas is present.  Small amount of free intraperitoneal air is seen about the liver and likely due to postoperative change.  There is a small amount of perihepatic and free pelvic fluid with increased attenuation consistent with the presence of hemorrhage. Fluid in the pelvis is Hounsfield unit measurements of 36.5.  No  focal liver lesions is seen on uninfused examination.  The patient appears to be status post splenectomy with splenosis noted in the left upper quadrant.  The adrenal glands, kidneys and pancreas appear normal.  Atherosclerotic vascular disease without aneurysm of the aorta is identified.  There is no lymphadenopathy. No focal bony abnormality.  IMPRESSION:  1.  Findings consistent with small bowel obstruction likely due to adhesions.  No mass or evidence of bowel ischemia is present on this uninfused study. 2.  Small to moderate volume of perihepatic and pelvic hemorrhage. Some of the fluid about the liver could be bowel although this is felt less likely. 3.  Status post splenectomy. 4.  Small bilateral pleural effusions.   Original Report Authenticated By: THOMAS L. D'ALESSIO, M.D.    Nm Hepatobiliary  05/16/2012  *RADIOLOGY REPORT*  Clinical Data:  Postop microscopic cholecystectomy.  Question bile leak.  NUCLEAR MEDICINE HEPATOBILIARY IMAGING  Technique:  Sequential images of the abdomen were obtained out to 60 minutes following intravenous administration of radiopharmaceutical.  Radiopharmaceutical:  5.0mCi Tc-99m Choletec  Comparison:  CT 05/16/2012  Findings: There is rapid clearance of radiotracer from the pool and homogeneous uptake within the liver.  Initially,  bile flows into the small bowel through the common bile duct.  During the lateral part of the first hour of imaging and into the second hour of imaging, radiotracer is noted along the inferior margin of the right hepatic lobe.  Subsequently this radiotracer descends along the right pericolic gutter and begins to outline the lateral margin of the liver.  These findings are consistent with a bile leak.  IMPRESSION:  1.  Bile leak centered along the inferior right hepatic margin and spilling into aathe right pericolic gutter.  2.  Bile  does flow through the common bile duct  into the small bowel.  Findings conveyed to Dr. Brayen Bunn on 05/16/2012 at  this 1650 hours   Original Report Authenticated By: STEWART EDMUNDS, M.D.    Us Renal  05/15/2012  *RADIOLOGY REPORT*  Clinical Data: Acute renal failure  RENAL/URINARY TRACT ULTRASOUND COMPLETE  Comparison:  Abdominal ultrasound 05/12/2012, 11/08/2006 CT  Findings:  Right Kidney:  10.5 cm. No hydronephrosis or focal mass.  Left Kidney:  10.4 cm. No hydronephrosis or focal mass.  Bladder:  Normal in appearance.  Ascites is noted.  IMPRESSION: Normal exam.  No hydronephrosis or solid renal mass.  Ascites.   Original Report Authenticated By: GRETCHEN E. GREEN, M.D.    Dg Chest Port 1 View  05/16/2012  *RADIOLOGY REPORT*  Clinical Data: Shortness of breath.  Status post cholecystectomy.  PORTABLE CHEST - 1 VIEW  Comparison: Chest and two views abdomen 05/15/2012 and plain film of the chest 12/02/2009.  Findings: Again seen is cardiomegaly.  Lung volumes are low with basilar atelectasis.  Calcified granuloma left midlung again identified.  No pneumothorax.  IMPRESSION:  1.  Bibasilar atelectasis in a low-volume chest. 2.  Cardiomegaly without edema.   Original   Report Authenticated By: THOMAS L. D'ALESSIO, M.D.    Dg Chest Port 1 View  05/15/2012  *RADIOLOGY REPORT*  Clinical Data: Hypoxia, CHF, lower abdominal and right shoulder pain, 2 days post cholecystectomy, history hypertension, CHF, atrial fibrillation, diabetes  PORTABLE CHEST - 1 VIEW  Comparison: Portable exam 1100 hours compared to 12/02/2009  Findings: Enlargement of cardiac silhouette with pulmonary vascular congestion. Atherosclerotic calcification aorta. Minimal bibasilar atelectasis. Lungs otherwise clear. Bones demineralized. No pleural effusion or pneumothorax.  IMPRESSION: Enlargement of cardiac silhouette with pulmonary vascular congestion. Minimal bibasilar atelectasis.   Original Report Authenticated By: MARK A. BOLES, M.D.    Dg Abd Acute W/chest  05/15/2012  *RADIOLOGY REPORT*  Clinical Data: Tympanitic abdomen.  Postoperative  abdomen. Hypoactive bowel sounds.  Abdominal pain.  ACUTE ABDOMEN SERIES (ABDOMEN 2 VIEW & CHEST 1 VIEW)  Comparison: 05/15/2012 chest radiograph.  Findings: Low lung volumes are present with basilar atelectasis. Cardiopericardial silhouette is unchanged, likely within normal limits allowing for volumes of inspiration.  Cholecystectomy clips are present in the right upper quadrant.  No free air is identified in the abdomen.  Small and large bowel dilation is present with scattered air fluid levels most compatible with ileus in the postoperative setting.  Paucity of rectal gas. Gas and stool is present along the descending colon.  Bilateral right greater than left hip osteoarthritis is incidentally noted. Right sacroiliac joint degenerative disease.  IMPRESSION:  1.  Low volume chest. 2.  Cholecystectomy. 3.  Bowel gas pattern compatible with ileus.   Original Report Authenticated By: GEOFFREY LAMKE, M.D.    Dg Abd Portable 1v  05/16/2012  *RADIOLOGY REPORT*  Clinical Data: Abdominal pain and distension, recent surgery.  PORTABLE ABDOMEN - 1 VIEW  Comparison: 05/15/2012  Findings: Hemidiaphragms/upper abdomen excluded from the images. Distended loops of large and small bowel again noted.  Surgical clips right upper quadrant.  The organ outlines normal where seen. No acute osseous finding.  IMPRESSION: Gaseous distension of loops of large and small bowel, similar to prior.   Original Report Authenticated By: ANDREW J. DELGAIZO, M.D.       LOS: 5 days     Marrianne Sica J. Aalijah Mims, MD, FACS [facility] [phone]   05/17/2012 7:41 AM      

## 2012-05-17 NOTE — Transfer of Care (Signed)
Immediate Anesthesia Transfer of Care Note  Patient: Nicole [name]  Procedure(s) Performed: Procedure(s) (LRB) with comments: ENDOSCOPIC RETROGRADE CHOLANGIOPANCREATOGRAPHY (ERCP) (N/A)  Patient Location: PACU  Anesthesia Type: General  Level of Consciousness: awake  Airway & Oxygen Therapy: Patient Spontanous Breathing and Patient connected to nasal cannula oxygen  Post-op Assessment: Report given to PACU RN and Post -op Vital signs reviewed and stable  Post vital signs: Reviewed and stable  Complications: No apparent anesthesia complications

## 2012-05-17 NOTE — Op Note (Signed)
[facility] [address] [place], [address]   ERCP PROCEDURE REPORT  PATIENT: Nicole [name], Nicole [name].  MR# :[id number] BIRTHDATE: August 04, 1930  GENDER: Female ENDOSCOPIST: Dorena Cookey, MD REFERRED BY: PROCEDURE DATE:  05/17/2012 PROCEDURE: ASA CLASS: INDICATIONS: biliary leak after cholecystectomy MEDICATIONS:    general anesthesia TOPICAL ANESTHETIC:  DESCRIPTION OF PROCEDURE:   After the risks benefits and alternatives of the procedure were thoroughly explained, informed consent was obtained.  The     endoscope was introduced through the mouth and advanced to the papilla of Vater which was partly obscured by an overlying fold. Selective guidewire cannulation was initially difficult but eventually accomplished after 1 or 2 passes of the guidewire into the pancreatic duct. A cholangiogram was obtained and revealed a smooth patent common bile duct with no obvious filling defect in no obvious leak although I did not inject large amounts of dye. A small sphincterotomy was performed and then a 5 cm 10 [place] plastic biliary stent was passed into position with good flow of clear amber bile after it was deployed. The patient tolerated the procedure well      .       COMPLICATIONS: none immediate  ENDOSCOPIC IMPRESSION:access for placement of a biliary stent for indication of bile leak status post cholecystectomy.  RECOMMENDATIONS:remove stent in 2-3 months. Continue antibiotics as needed short-term and otherwise general supportive care     _______________________________ eSignedDorena Cookey, MD 05/17/2012 10:53 AM   CC:

## 2012-05-17 NOTE — Progress Notes (Signed)
[facility]  Subjective:  No complaints. Creat down 3.0 today, UOP 600 cc yesterday with only 450 cc in. Today getting IVF's is 2975 in and 925 out so far.    Objective: Vital signs in last 24 hours: Blood pressure 142/66, pulse 100, temperature 99.1 F (37.3 C), temperature source Oral, resp. rate 25, height 5\' 10"  (1.778 m), weight 84 kg (185 lb 3 oz), SpO2 91.00%.    PHYSICAL EXAM General--as above Chest--clear Heart--no rub Abd--distended, tender, no rebound Extr--no edema  Lab Results:   Lab 05/17/12 0555 05/16/12 0700 05/15/12 1114  NA 146* 141 138  K 3.1* 3.8 3.9  CL 104 100 100  CO2 32 24 25  BUN 43* 35* 24*  CREATININE 3.01* 3.36* 2.81*  ALB -- -- --  GLUCOSE 141* -- --  CALCIUM 7.7* 8.7 8.3*  PHOS 3.4 -- --     Basename 05/17/12 0555 05/16/12 0700  WBC 13.7* 13.9*  HGB 9.9* 12.3  HCT 27.4* 34.7*  PLT 266 281     I have reviewed the patient's current medications. Scheduled: Continuous:  Assessment/Plan: No hydro on [place].  I/O 1040/400 last 24 hr   1.   Acute Kidney Injury, hemodynamically mediated - improving, creat down to 3.0 today with improved UOP. Continue fluids. Change to 1/2 NS with rising serum Na.  2.   Severe systemic arterial hypertension --not that high currently 3.   Hypovolemia due to gastrointestinal losses- getting IV NS at 125 cc/hr today.   4.   Post op Cholecystectomy--going for CT now; on cipro + flagyl. Had ERCP today without evid of bile leak. 5.  Afib/RVR--on cardizem drip,  No heparin     LOS: 5 days   Williamson Cavanah D 05/17/2012,8:44 PM   .labalb

## 2012-05-17 NOTE — Preoperative (Signed)
Beta Blockers   Reason not to administer Beta Blockers:Not Applicable 

## 2012-05-17 NOTE — Progress Notes (Signed)
[facility] Progress Note  Subjective: Pt feels better, less abd pain  Objective: Vital signs in last 24 hours: Temp:  [98.1 F (36.7 C)-98.5 F (36.9 C)] 98.1 F (36.7 C) (10/26 0757) Pulse Rate:  [97-111] 111  (10/26 0757) Resp:  [21-24] 24  (10/26 0757) BP: (130-146)/(68-82) 146/82 mmHg (10/26 0757) SpO2:  [92 %-98 %] 92 % (10/26 0757) Weight change:    PE:abd soft, mild tenderness over incision sites, + BS  Lab Results: Results for orders placed during the hospital encounter of 05/12/12 (from the past 24 hour(s))  GLUCOSE, CAPILLARY     Status: Abnormal   Collection Time   05/16/12 11:58 AM      Component Value Range   Glucose-Capillary 187 (*) 70 - 99 mg/dL   Comment 1 Documented in Chart     Comment 2 Notify RN    GLUCOSE, CAPILLARY     Status: Abnormal   Collection Time   05/16/12  5:08 PM      Component Value Range   Glucose-Capillary 156 (*) 70 - 99 mg/dL   Comment 1 Notify RN     Comment 2 Documented in Chart    GLUCOSE, CAPILLARY     Status: Abnormal   Collection Time   05/16/12  8:32 PM      Component Value Range   Glucose-Capillary 124 (*) 70 - 99 mg/dL   Comment 1 Notify RN     Comment 2 Documented in Chart    GLUCOSE, CAPILLARY     Status: Abnormal   Collection Time   05/17/12 12:08 AM      Component Value Range   Glucose-Capillary 130 (*) 70 - 99 mg/dL   Comment 1 Notify RN     Comment 2 Documented in Chart    GLUCOSE, CAPILLARY     Status: Abnormal   Collection Time   05/17/12  4:24 AM      Component Value Range   Glucose-Capillary 145 (*) 70 - 99 mg/dL   Comment 1 Documented in Chart     Comment 2 Notify RN    CBC     Status: Abnormal   Collection Time   05/17/12  5:55 AM      Component Value Range   WBC 13.7 (*) 4.0 - 10.5 K/uL   RBC 3.34 (*) 3.87 - 5.11 MIL/uL   Hemoglobin 9.9 (*) 12.0 - 15.0 g/dL   HCT 13.0 (*) 86.5 - 78.4 %   MCV 82.0  78.0 - 100.0 fL   MCH 29.6  26.0 - 34.0 pg   MCHC 36.1 (*) 30.0 - 36.0 g/dL   RDW  69.6  29.5 - 28.4 %   Platelets 266  150 - 400 K/uL  BASIC METABOLIC PANEL     Status: Abnormal   Collection Time   05/17/12  5:55 AM      Component Value Range   Sodium 146 (*) 135 - 145 mEq/L   Potassium 3.1 (*) 3.5 - 5.1 mEq/L   Chloride 104  96 - 112 mEq/L   CO2 32  19 - 32 mEq/L   Glucose, Bld 141 (*) 70 - 99 mg/dL   BUN 43 (*) 6 - 23 mg/dL   Creatinine, Ser 1.32 (*) 0.50 - 1.10 mg/dL   Calcium 7.7 (*) 8.4 - 10.5 mg/dL   GFR calc non Af Amer 14 (*) >90 mL/min   GFR calc Af Amer 16 (*) >90 mL/min  PHOSPHORUS     Status: Normal  Collection Time   05/17/12  5:55 AM      Component Value Range   Phosphorus 3.4  2.3 - 4.6 mg/dL  MAGNESIUM     Status: Normal   Collection Time   05/17/12  5:55 AM      Component Value Range   Magnesium 1.7  1.5 - 2.5 mg/dL  HEPATIC FUNCTION PANEL     Status: Abnormal   Collection Time   05/17/12  5:55 AM      Component Value Range   Total Protein 5.7 (*) 6.0 - 8.3 g/dL   Albumin 2.4 (*) 3.5 - 5.2 g/dL   AST 20  0 - 37 U/L   ALT 9  0 - 35 U/L   Alkaline Phosphatase 66  39 - 117 U/L   Total Bilirubin 0.7  0.3 - 1.2 mg/dL   Bilirubin, Direct 0.3  0.0 - 0.3 mg/dL   Indirect Bilirubin 0.4  0.3 - 0.9 mg/dL  GLUCOSE, CAPILLARY     Status: Abnormal   Collection Time   05/17/12  7:55 AM      Component Value Range   Glucose-Capillary 144 (*) 70 - 99 mg/dL   Comment 1 Notify RN      Studies/Results: Ct Abdomen Pelvis Wo Contrast  05/16/2012  *RADIOLOGY REPORT*  Clinical Data: Abdominal pain and distention.  Status post cholecystectomy 05/12/2012.  CT ABDOMEN AND PELVIS WITHOUT CONTRAST  Technique:  Multidetector CT imaging of the abdomen and pelvis was performed following the standard protocol without intravenous contrast.  Comparison: CT chest and abdomen 11/08/2006 and CT abdomen and pelvis 02/12/2006.  Findings: The patient has small bilateral pleural effusions, larger on the right.  There is associated basilar atelectasis, also worse on the  right.  Heart size is enlarged.  No pericardial effusion.  NG tube is in place.  The patient has a small bowel obstruction with dilatation of small bowel loops up to 4.4 cm and extensive stool within small bowel.  Distal small bowel loops are completely decompressed.  Transition point is not identified. No pneumatosis or portal venous gas is present.  Small amount of free intraperitoneal air is seen about the liver and likely due to postoperative change.  There is a small amount of perihepatic and free pelvic fluid with increased attenuation consistent with the presence of hemorrhage. Fluid in the pelvis is Hounsfield unit measurements of 36.5.  No focal liver lesions is seen on uninfused examination.  The patient appears to be status post splenectomy with splenosis noted in the left upper quadrant.  The adrenal glands, kidneys and pancreas appear normal.  Atherosclerotic vascular disease without aneurysm of the aorta is identified.  There is no lymphadenopathy. No focal bony abnormality.  IMPRESSION:  1.  Findings consistent with small bowel obstruction likely due to adhesions.  No mass or evidence of bowel ischemia is present on this uninfused study. 2.  Small to moderate volume of perihepatic and pelvic hemorrhage. Some of the fluid about the liver could be bowel although this is felt less likely. 3.  Status post splenectomy. 4.  Small bilateral pleural effusions.   Original Report Authenticated By: Bernadene Bell. Maricela Curet, M.D.    Nm Hepatobiliary  05/16/2012  *RADIOLOGY REPORT*  Clinical Data:  Postop microscopic cholecystectomy.  Question bile leak.  NUCLEAR MEDICINE HEPATOBILIARY IMAGING  Technique:  Sequential images of the abdomen were obtained out to 60 minutes following intravenous administration of radiopharmaceutical.  Radiopharmaceutical:  5.67mCi Tc-22m Choletec  Comparison:  CT 05/16/2012  Findings: There is rapid clearance of radiotracer from the pool and homogeneous uptake within the liver.   Initially,  bile flows into the small bowel through the common bile duct.  During the lateral part of the first hour of imaging and into the second hour of imaging, radiotracer is noted along the inferior margin of the right hepatic lobe.  Subsequently this radiotracer descends along the right pericolic gutter and begins to outline the lateral margin of the liver.  These findings are consistent with a bile leak.  IMPRESSION:  1.  Bile leak centered along the inferior right hepatic margin and spilling into aathe right pericolic gutter.  2.  Bile  does flow through the common bile duct  into the small bowel.  Findings conveyed to Dr. Jamey Ripa on 05/16/2012 at this 1650 hours   Original Report Authenticated By: Genevive Bi, M.D.    US Renal  05/15/2012  *RADIOLOGY REPORT*  Clinical Data: Acute renal failure  RENAL/URINARY TRACT ULTRASOUND COMPLETE  Comparison:  Abdominal ultrasound 05/12/2012, 11/08/2006 CT  Findings:  Right Kidney:  10.5 cm. No hydronephrosis or focal mass.  Left Kidney:  10.4 cm. No hydronephrosis or focal mass.  Bladder:  Normal in appearance.  Ascites is noted.  IMPRESSION: Normal exam.  No hydronephrosis or solid renal mass.  Ascites.   Original Report Authenticated By: Harrel Lemon, M.D.    Dg Chest Port 1 View  05/16/2012  *RADIOLOGY REPORT*  Clinical Data: Shortness of breath.  Status post cholecystectomy.  PORTABLE CHEST - 1 VIEW  Comparison: Chest and two views abdomen 05/15/2012 and plain film of the chest 12/02/2009.  Findings: Again seen is cardiomegaly.  Lung volumes are low with basilar atelectasis.  Calcified granuloma left midlung again identified.  No pneumothorax.  IMPRESSION:  1.  Bibasilar atelectasis in a low-volume chest. 2.  Cardiomegaly without edema.   Original Report Authenticated By: Bernadene Bell. D'ALESSIO, M.D.    Dg Chest Port 1 View  05/15/2012  *RADIOLOGY REPORT*  Clinical Data: Hypoxia, CHF, lower abdominal and right shoulder pain, 2 days post  cholecystectomy, history hypertension, CHF, atrial fibrillation, diabetes  PORTABLE CHEST - 1 VIEW  Comparison: Portable exam 1100 hours compared to 12/02/2009  Findings: Enlargement of cardiac silhouette with pulmonary vascular congestion. Atherosclerotic calcification aorta. Minimal bibasilar atelectasis. Lungs otherwise clear. Bones demineralized. No pleural effusion or pneumothorax.  IMPRESSION: Enlargement of cardiac silhouette with pulmonary vascular congestion. Minimal bibasilar atelectasis.   Original Report Authenticated By: Lollie Marrow, M.D.    Dg Abd Acute W/chest  05/15/2012  *RADIOLOGY REPORT*  Clinical Data: Tympanitic abdomen.  Postoperative abdomen. Hypoactive bowel sounds.  Abdominal pain.  ACUTE ABDOMEN SERIES (ABDOMEN 2 VIEW & CHEST 1 VIEW)  Comparison: 05/15/2012 chest radiograph.  Findings: Low lung volumes are present with basilar atelectasis. Cardiopericardial silhouette is unchanged, likely within normal limits allowing for volumes of inspiration.  Cholecystectomy clips are present in the right upper quadrant.  No free air is identified in the abdomen.  Small and large bowel dilation is present with scattered air fluid levels most compatible with ileus in the postoperative setting.  Paucity of rectal gas. Gas and stool is present along the descending colon.  Bilateral right greater than left hip osteoarthritis is incidentally noted. Right sacroiliac joint degenerative disease.  IMPRESSION:  1.  Low volume chest. 2.  Cholecystectomy. 3.  Bowel gas pattern compatible with ileus.   Original Report Authenticated By: Andreas Newport, M.D.    Dg Abd Portable 1v  05/16/2012  *  RADIOLOGY REPORT*  Clinical Data: Abdominal pain and distension, recent surgery.  PORTABLE ABDOMEN - 1 VIEW  Comparison: 05/15/2012  Findings: Hemidiaphragms/upper abdomen excluded from the images. Distended loops of large and small bowel again noted.  Surgical clips right upper quadrant.  The organ outlines normal  where seen. No acute osseous finding.  IMPRESSION: Gaseous distension of loops of large and small bowel, similar to prior.   Original Report Authenticated By: Waneta Martins, M.D.       Assessment: Bile leak post cholecystectomy Renal insufficiency SBO vs ileus  Plan: Proceed with ERCP and attempted stent placement. Risks, rationale and alternatives reviewed and she wishes to proceed    Marquita Lias C 05/17/2012, 9:25 AM

## 2012-05-17 NOTE — H&P (View-Only) (Signed)
3 Days Post-Op   Assessment: s/p Procedure(s): LAPAROSCOPIC CHOLECYSTECTOMY WITH INTRAOPERATIVE CHOLANGIOGRAM Patient Active Problem List  Diagnosis  . Anemia  . History of colon cancer, stage III  . Abdominal pain, acute, right upper quadrant  . Nausea and vomiting  . Dehydration  . Cholecystitis chronic, acute, S/P lap cholecystectomy 05/14/13  . Thoracic aortic aneurysm, 4.8cm 2011  . CHF, past NICM with an EF of 30%, most recent echo 2011 showed EF to be45- 50%  . Chronic atrial fibrillation, not felt to be a Coumadin candidate in the past  . Acute on chronic renal insufficiency  . Normal coronary arteries, 2008  . Diabetes mellitus  . HTN (hypertension)  . Respiratory distress  . ARF (acute renal failure)  . Atrial fibrillation with RVR  . Postoperative ileus  . Small bowel obstruction due to adhesions  . Bile leak, postoperative  . Hypokalemia    Bile leak Hypokalemia Post op bleed, stable Renal insufficiency, improved Clinically improved  Plan: For ERCP today hopefully bile leak will stop. Will need f/u abd xrays because of suggestion of SBO on ct scan - will do tomorrow  Subjective: Feels much better, pain nearly resolved  Objective: Vital signs in last 24 hours: Temp:  [97.9 F (36.6 C)-98.5 F (36.9 C)] 98.2 F (36.8 C) (10/26 0000) Pulse Rate:  [97] 97  (10/25 2000) Resp:  [21] 21  (10/25 2000) BP: (130-149)/(68-84) 130/68 mmHg (10/25 2000) SpO2:  [94 %-98 %] 94 % (10/25 2000)   Intake/Output from previous day: 10/25 0701 - 10/26 0700 In: 275 [I.V.:175; IV Piggyback:100] Out: 6550 [Urine:600; Emesis/NG output:5950] Intake/Output this shift:     General appearance: alert, cooperative and no distress GI: Softer and less tender than yesterday, still tender RUQ  Incision: No additional bleeding  Lab Results:   Basename 05/17/12 0555 05/16/12 0700  WBC 13.7* 13.9*  HGB 9.9* 12.3  HCT 27.4* 34.7*  PLT 266 281   BMET  Basename 05/17/12  0555 05/16/12 0700  NA 146* 141  K 3.1* 3.8  CL 104 100  CO2 32 24  GLUCOSE 141* 189*  BUN 43* 35*  CREATININE 3.01* 3.36*  CALCIUM 7.7* 8.7   PT/INR No results found for this basename: LABPROT:2,INR:2 in the last 72 hours ABG  Basename 05/16/12 0253  PHART 7.438  HCO3 25.4*    MEDS, Scheduled    . ciprofloxacin  200 mg Intravenous Q24H  . diltiazem  10 mg Intravenous Once  . insulin aspart  0-9 Units Subcutaneous Q4H  . iohexol  20 mL Oral Q1 Hr x 2  . levothyroxine  26 mcg Intravenous QAC breakfast  . metronidazole  500 mg Intravenous Q8H  . sodium chloride  500 mL Intravenous Once  . sodium chloride  3 mL Intravenous Q12H  . DISCONTD: diltiazem  10 mg Intravenous Once    Studies/Results: Ct Abdomen Pelvis Wo Contrast  05/16/2012  *RADIOLOGY REPORT*  Clinical Data: Abdominal pain and distention.  Status post cholecystectomy 05/12/2012.  CT ABDOMEN AND PELVIS WITHOUT CONTRAST  Technique:  Multidetector CT imaging of the abdomen and pelvis was performed following the standard protocol without intravenous contrast.  Comparison: CT chest and abdomen 11/08/2006 and CT abdomen and pelvis 02/12/2006.  Findings: The patient has small bilateral pleural effusions, larger on the right.  There is associated basilar atelectasis, also worse on the right.  Heart size is enlarged.  No pericardial effusion.  NG tube is in place.  The patient has a small bowel obstruction  with dilatation of small bowel loops up to 4.4 cm and extensive stool within small bowel.  Distal small bowel loops are completely decompressed.  Transition point is not identified. No pneumatosis or portal venous gas is present.  Small amount of free intraperitoneal air is seen about the liver and likely due to postoperative change.  There is a small amount of perihepatic and free pelvic fluid with increased attenuation consistent with the presence of hemorrhage. Fluid in the pelvis is Hounsfield unit measurements of 36.5.  No  focal liver lesions is seen on uninfused examination.  The patient appears to be status post splenectomy with splenosis noted in the left upper quadrant.  The adrenal glands, kidneys and pancreas appear normal.  Atherosclerotic vascular disease without aneurysm of the aorta is identified.  There is no lymphadenopathy. No focal bony abnormality.  IMPRESSION:  1.  Findings consistent with small bowel obstruction likely due to adhesions.  No mass or evidence of bowel ischemia is present on this uninfused study. 2.  Small to moderate volume of perihepatic and pelvic hemorrhage. Some of the fluid about the liver could be bowel although this is felt less likely. 3.  Status post splenectomy. 4.  Small bilateral pleural effusions.   Original Report Authenticated By: Bernadene Bell. Maricela Curet, M.D.    Nm Hepatobiliary  05/16/2012  *RADIOLOGY REPORT*  Clinical Data:  Postop microscopic cholecystectomy.  Question bile leak.  NUCLEAR MEDICINE HEPATOBILIARY IMAGING  Technique:  Sequential images of the abdomen were obtained out to 60 minutes following intravenous administration of radiopharmaceutical.  Radiopharmaceutical:  5.24mCi Tc-45m Choletec  Comparison:  CT 05/16/2012  Findings: There is rapid clearance of radiotracer from the pool and homogeneous uptake within the liver.  Initially,  bile flows into the small bowel through the common bile duct.  During the lateral part of the first hour of imaging and into the second hour of imaging, radiotracer is noted along the inferior margin of the right hepatic lobe.  Subsequently this radiotracer descends along the right pericolic gutter and begins to outline the lateral margin of the liver.  These findings are consistent with a bile leak.  IMPRESSION:  1.  Bile leak centered along the inferior right hepatic margin and spilling into aathe right pericolic gutter.  2.  Bile  does flow through the common bile duct  into the small bowel.  Findings conveyed to Dr. Jamey Ripa on 05/16/2012 at  this 1650 hours   Original Report Authenticated By: Genevive Bi, M.D.    US Renal  05/15/2012  *RADIOLOGY REPORT*  Clinical Data: Acute renal failure  RENAL/URINARY TRACT ULTRASOUND COMPLETE  Comparison:  Abdominal ultrasound 05/12/2012, 11/08/2006 CT  Findings:  Right Kidney:  10.5 cm. No hydronephrosis or focal mass.  Left Kidney:  10.4 cm. No hydronephrosis or focal mass.  Bladder:  Normal in appearance.  Ascites is noted.  IMPRESSION: Normal exam.  No hydronephrosis or solid renal mass.  Ascites.   Original Report Authenticated By: Harrel Lemon, M.D.    Dg Chest Port 1 View  05/16/2012  *RADIOLOGY REPORT*  Clinical Data: Shortness of breath.  Status post cholecystectomy.  PORTABLE CHEST - 1 VIEW  Comparison: Chest and two views abdomen 05/15/2012 and plain film of the chest 12/02/2009.  Findings: Again seen is cardiomegaly.  Lung volumes are low with basilar atelectasis.  Calcified granuloma left midlung again identified.  No pneumothorax.  IMPRESSION:  1.  Bibasilar atelectasis in a low-volume chest. 2.  Cardiomegaly without edema.   Original  Report Authenticated By: Bernadene Bell. D'ALESSIO, M.D.    Dg Chest Port 1 View  05/15/2012  *RADIOLOGY REPORT*  Clinical Data: Hypoxia, CHF, lower abdominal and right shoulder pain, 2 days post cholecystectomy, history hypertension, CHF, atrial fibrillation, diabetes  PORTABLE CHEST - 1 VIEW  Comparison: Portable exam 1100 hours compared to 12/02/2009  Findings: Enlargement of cardiac silhouette with pulmonary vascular congestion. Atherosclerotic calcification aorta. Minimal bibasilar atelectasis. Lungs otherwise clear. Bones demineralized. No pleural effusion or pneumothorax.  IMPRESSION: Enlargement of cardiac silhouette with pulmonary vascular congestion. Minimal bibasilar atelectasis.   Original Report Authenticated By: Lollie Marrow, M.D.    Dg Abd Acute W/chest  05/15/2012  *RADIOLOGY REPORT*  Clinical Data: Tympanitic abdomen.  Postoperative  abdomen. Hypoactive bowel sounds.  Abdominal pain.  ACUTE ABDOMEN SERIES (ABDOMEN 2 VIEW & CHEST 1 VIEW)  Comparison: 05/15/2012 chest radiograph.  Findings: Low lung volumes are present with basilar atelectasis. Cardiopericardial silhouette is unchanged, likely within normal limits allowing for volumes of inspiration.  Cholecystectomy clips are present in the right upper quadrant.  No free air is identified in the abdomen.  Small and large bowel dilation is present with scattered air fluid levels most compatible with ileus in the postoperative setting.  Paucity of rectal gas. Gas and stool is present along the descending colon.  Bilateral right greater than left hip osteoarthritis is incidentally noted. Right sacroiliac joint degenerative disease.  IMPRESSION:  1.  Low volume chest. 2.  Cholecystectomy. 3.  Bowel gas pattern compatible with ileus.   Original Report Authenticated By: Andreas Newport, M.D.    Dg Abd Portable 1v  05/16/2012  *RADIOLOGY REPORT*  Clinical Data: Abdominal pain and distension, recent surgery.  PORTABLE ABDOMEN - 1 VIEW  Comparison: 05/15/2012  Findings: Hemidiaphragms/upper abdomen excluded from the images. Distended loops of large and small bowel again noted.  Surgical clips right upper quadrant.  The organ outlines normal where seen. No acute osseous finding.  IMPRESSION: Gaseous distension of loops of large and small bowel, similar to prior.   Original Report Authenticated By: Waneta Martins, M.D.       LOS: 5 days     Currie Paris, MD, [facility], [place] [phone]   05/17/2012 7:41 AM

## 2012-05-17 NOTE — Interval H&P Note (Deleted)
History and Physical Interval Note:  05/17/2012 9:23 AM  Nicole [name]  has presented today for surgery, with the diagnosis of Chronic Cholecystitis  The various methods of treatment have been discussed with the patient and family. After consideration of risks, benefits and other options for treatment, the patient has consented to  Procedure(s) (LRB) with comments: ENDOSCOPIC RETROGRADE CHOLANGIOPANCREATOGRAPHY (ERCP) (N/A) as a surgical intervention .  The patient's history has been reviewed, patient examined, no change in status, stable for surgery.  I have reviewed the patient's chart and labs.  Questions were answered to the patient's satisfaction.     [name]  .jc

## 2012-05-17 NOTE — Brief Op Note (Signed)
05/12/2012 - 05/17/2012  10:41 AM  PATIENT:  Nicole [name]  78 y.o. female  PRE-OPERATIVE DIAGNOSIS:  Chronic Cholecystitis  POST-OPERATIVE DIAGNOSIS:  stent placed in to CBD  PROCEDURE:  Procedure(s) (LRB) with comments: ENDOSCOPIC RETROGRADE CHOLANGIOPANCREATOGRAPHY (ERCP) (N/A) A cholangiogram revealed no obvious filling defect and no obvious leak. Pancreatic duct was not injected but was entered with the guidewire on 2 occasions prior to selective cannulation of the common bile duct. After small sphincterotomy performed, a 5 cm 10 French stent was placed under fluoroscopic visualization with good drainage of clear amber bile.  SURGEON:  Surgeon(s) and Role:    * Barrie Folk, MD - Primary  PHYSICIAN ASSISTANT:   ASSISTANTS:    ANESTHESIA:   general  EBL:  Total I/O In: 1350 [I.V.:1350] Out: 225 [Urine:225]

## 2012-05-17 NOTE — Progress Notes (Signed)
PATIENT DETAILS Name: Nicole [name] Age: 78 y.o. Sex: female Date of Birth: 1930/12/23 Admit Date: 05/12/2012 Admitting Physician Currie Paris, MD [id number], [name], DO  Subjective: Feels much better compared to 10/25  Assessment/Plan: Principal Problem: Abdominal Distention -2/2 SBO-post operative--following Lap Cholecystectomy 10/23 -c/w NPO status, NG tube, and supportive care -belly less distended and less tender compared to 10/25  -Bile Leak -following Lap Cholecystectomy 10/23 -For ERCP with Stent placement today -Afebrile, slight decrease in WBC today -belly less tender -c/e empiric Cipro and Flagyl  Abdominal Wall hematoma -H/H decreased today-suspect from IVF rather than bleeding -monitor H&H  q 8 for one day -may need transfusion if this trend continues  ARF -appreciate Renal eval -suspicion for hemodynaminc mediated renal injury-as patient was very briefly hypotensive, yesterday-retrospectively-hypotension probably was a result from developing abd wall hematoma/?intra-abdominal pathology -creatinine now starting to downtrend -will need to start decreasing IVF-will defer to renal  Afib RVR -c/w cardizem gtt -not a candidate for anticoagulation-given hematoma/potential for repeart surgery, more importantly has been deemed not a be candidate for anticoagulation by her cardiologist in the past as well-?non compliance -cards following  Resp Failure -hypoxic-stable with just 2-3 L of O2 -suspect 2/2 to abdominal pain/distention/atelectasis/mild pul congestion -monitor for now  Cholecystitis -s/p Lap cholecystectomy on 10/23 -unfortunately complicated by abdominal wall hematoma and bile leak-see above  DM -holding all oral agents -CBG's relatively stable -will be NPO-so just c/w SSI for now  CHF -both systolic and diastolic dysfunction -still compensated-although some mild pulmonary edema of CXR -cards following  HTN -given hypotension on  10/24- and resultant ARF-holding all antihypertensives -monitor and restart IV meds as needed  Thoracic Aortic Aneurysm -outpatient follow up  H/O Stage III colon Ca -outpatient follow up with Oncology  Disposition: Remain inpatient  DVT Prophylaxis: scd's  Code Status: Full code   Procedures:  Laparoscopic Cholecystectomy 10/23, ERCP today  CONSULTS:  cardiology, nephrology, GI and general surgery  PHYSICAL EXAM: Vital signs in last 24 hours: Filed Vitals:   05/16/12 0815 05/16/12 1707 05/16/12 2000 05/17/12 0000  BP: 149/84 141/78 130/68   Pulse:   97   Temp: 97.9 F (36.6 C) 98.5 F (36.9 C) 98.2 F (36.8 C) 98.2 F (36.8 C)  TempSrc: Oral Oral Oral Oral  Resp:   21   Height:      Weight:      SpO2:  98% 94%     Weight change:  Body mass index is 26.57 kg/(m^2).   Gen Exam: Awake and alert with clear speech.   Neck: Supple, No JVD.   Chest: B/L Clear.   CVS: S1 S2 Regular, tachycardia.  Abdomen: soft, BS +,less tender and less distended today Extremities: no edema, lower extremities warm to touch. Neurologic: Non Focal.   Skin: No Rash.   Wounds: N/A.    Intake/Output from previous day:  Intake/Output Summary (Last 24 hours) at 05/17/12 0730 Last data filed at 05/17/12 0400  Gross per 24 hour  Intake    100 ml  Output   6550 ml  Net  -6450 ml     LAB RESULTS: CBC  Lab 05/17/12 0555 05/16/12 0700 05/16/12 0143 05/15/12 2317 05/15/12 1527 05/15/12 1114 05/15/12 0610 05/12/12 1943 05/11/12 2352  WBC 13.7* 13.9* 15.4* -- -- 9.5 8.5 -- --  HGB 9.9* 12.3 11.9* 11.6* 11.8* -- -- -- --  HCT 27.4* 34.7* 33.5* 33.2* 32.8* -- -- -- --  PLT 266 281 247 -- -- 252 254 -- --  MCV 82.0 82.6 83.1 -- -- 84.3 84.0 -- --  MCH 29.6 29.3 29.5 -- -- 29.0 28.6 -- --  MCHC 36.1* 35.4 35.5 -- -- 34.4 34.1 -- --  RDW 15.4 15.4 15.4 -- -- 15.5 15.6* -- --  LYMPHSABS -- -- 0.7 -- -- -- -- 1.0 1.4  MONOABS -- -- 1.8* -- -- -- -- 0.8 0.6  EOSABS -- -- 0.0 -- -- --  -- 0.0 0.0  BASOSABS -- -- 0.0 -- -- -- -- 0.0 0.0  BANDABS -- -- -- -- -- -- -- -- --    Chemistries   Lab 05/17/12 0555 05/16/12 0700 05/15/12 1114 05/15/12 0610 05/14/12 1739 05/14/12 0550  NA 146* 141 138 140 -- 151*  K 3.1* 3.8 3.9 4.2 -- 3.4*  CL 104 100 100 102 -- 109  CO2 32 24 25 26  -- 29  GLUCOSE 141* 189* 178* 158* -- 112*  BUN 43* 35* 24* 21 -- 21  CREATININE 3.01* 3.36* 2.81* 2.33* 1.08 --  CALCIUM 7.7* 8.7 8.3* 8.2* -- 9.0  MG 1.7 -- -- -- -- 1.9    CBG:  Lab 05/17/12 0424 05/17/12 0008 05/16/12 2032 05/16/12 1708 05/16/12 1158  GLUCAP 145* 130* 124* 156* 187*    GFR Estimated Creatinine Clearance: 17.9 ml/min (by C-G formula based on Cr of 3.01).  Coagulation profile  Lab 05/13/12 0645  INR 1.11  PROTIME --    Cardiac Enzymes No results found for this basename: CK:3,CKMB:3,TROPONINI:3,MYOGLOBIN:3 in the last 168 hours  No components found with this basename: POCBNP:3 No results found for this basename: DDIMER:2 in the last 72 hours No results found for this basename: HGBA1C:2 in the last 72 hours No results found for this basename: CHOL:2,HDL:2,LDLCALC:2,TRIG:2,CHOLHDL:2,LDLDIRECT:2 in the last 72 hours No results found for this basename: TSH,T4TOTAL,FREET3,T3FREE,THYROIDAB in the last 72 hours No results found for this basename: VITAMINB12:2,FOLATE:2,FERRITIN:2,TIBC:2,IRON:2,RETICCTPCT:2 in the last 72 hours No results found for this basename: LIPASE:2,AMYLASE:2 in the last 72 hours  Urine Studies No results found for this basename: UACOL:2,UAPR:2,USPG:2,UPH:2,UTP:2,UGL:2,UKET:2,UBIL:2,UHGB:2,UNIT:2,UROB:2,ULEU:2,UEPI:2,UWBC:2,URBC:2,UBAC:2,CAST:2,CRYS:2,UCOM:2,BILUA:2 in the last 72 hours  MICROBIOLOGY: Recent Results (from the past 240 hour(s))  SURGICAL PCR SCREEN     Status: Abnormal   Collection Time   05/14/12  4:10 AM      Component Value Range Status Comment   MRSA, PCR NEGATIVE  NEGATIVE Final    Staphylococcus aureus POSITIVE (*)  NEGATIVE Final   URINE CULTURE     Status: Normal   Collection Time   05/16/12  1:50 AM      Component Value Range Status Comment   Specimen Description URINE, CATHETERIZED   Final    Special Requests NONE   Final    Culture  Setup Time 05/16/2012 02:27   Final    Colony Count NO GROWTH   Final    Culture NO GROWTH   Final    Report Status 05/16/2012 FINAL   Final     RADIOLOGY STUDIES/RESULTS: Dg Cholangiogram Operative  05/14/2012  *RADIOLOGY REPORT*  Clinical Data: Laparoscopic cholecystectomy  INTRAOPERATIVE CHOLANGIOGRAM  Comparison:  Abdominal ultrasound - 05/12/2012  Findings:  Intraoperative angiographic images of the right upper abdominal quadrant during laparoscopic cholecystectomy are provided for review.  Surgical clips overlie the expected location of the gallbladder fossa.  Contrast injection demonstrates selective cannulation of the central aspect of the cystic duct.  There is brisk passage of contrast through the central aspect of the cystic duct with filling of a mildly dilated common bile duct. There is  brisk passage of contrast though the CBD and into the descending portion of the duodenum.  There is minimal reflux of injected contrast into the common hepatic duct and central aspect of the nondilated intrahepatic biliary system.  There are no discrete filling defects within the opacified portions of the biliary system to suggest the presence of choledocholithiasis.  IMPRESSION:  Intraoperative cholangiogram as above.  No discrete filling defects to suggest the presence of choledocholithiasis.   Original Report Authenticated By: Waynard Reeds, M.D.    US Abdomen Complete  05/12/2012  *RADIOLOGY REPORT*  Clinical Data:  Upper abdominal pain.  Concern for gallstones. History of splenectomy.  COMPLETE ABDOMINAL ULTRASOUND  Comparison:  CT 11/08/2006  Findings:  Gallbladder:  There is a 0.9 cm stone near the gallbladder neck. There is no evidence for gallbladder wall thickening.   The patient does not have a sonographic Murphy's sign.  Common bile duct:  Measures 0.5 cm.  Liver:  The liver is echogenic and slightly heterogeneous.  IVC:  Appears normal.  Pancreas:  Poorly visualized due to bowel gas.  Spleen:  The patient had a splenectomy but there is a round structure in the left upper quadrant consistent with a splenule. Splenule measures up to 2.4 cm and this was present on the prior examination.  Right Kidney:  Right kidney measures 10.1 cm in length without hydronephrosis.  Left Kidney:  Left kidney is difficult to evaluate and roughly measures 9.6 cm in length without hydronephrosis.  Abdominal aorta: Retroperitoneal structures are difficult to evaluate.  Cannot exclude aneurysmal dilatation of the mid abdominal aorta.  IMPRESSION: There is a 0.9 cm gallstone near the gallbladder neck.  No evidence for biliary dilatation.  Limited evaluation of the abdominal aorta.  There is concern for aneurysmal dilatation in the mid abdominal aorta.  This could be better evaluated with CT.   Original Report Authenticated By: Richarda Overlie, M.D.    US Renal  05/15/2012  *RADIOLOGY REPORT*  Clinical Data: Acute renal failure  RENAL/URINARY TRACT ULTRASOUND COMPLETE  Comparison:  Abdominal ultrasound 05/12/2012, 11/08/2006 CT  Findings:  Right Kidney:  10.5 cm. No hydronephrosis or focal mass.  Left Kidney:  10.4 cm. No hydronephrosis or focal mass.  Bladder:  Normal in appearance.  Ascites is noted.  IMPRESSION: Normal exam.  No hydronephrosis or solid renal mass.  Ascites.   Original Report Authenticated By: Harrel Lemon, M.D.    Dg Chest Port 1 View  05/16/2012  *RADIOLOGY REPORT*  Clinical Data: Shortness of breath.  Status post cholecystectomy.  PORTABLE CHEST - 1 VIEW  Comparison: Chest and two views abdomen 05/15/2012 and plain film of the chest 12/02/2009.  Findings: Again seen is cardiomegaly.  Lung volumes are low with basilar atelectasis.  Calcified granuloma left midlung again  identified.  No pneumothorax.  IMPRESSION:  1.  Bibasilar atelectasis in a low-volume chest. 2.  Cardiomegaly without edema.   Original Report Authenticated By: Bernadene Bell. D'ALESSIO, M.D.    Dg Chest Port 1 View  05/15/2012  *RADIOLOGY REPORT*  Clinical Data: Hypoxia, CHF, lower abdominal and right shoulder pain, 2 days post cholecystectomy, history hypertension, CHF, atrial fibrillation, diabetes  PORTABLE CHEST - 1 VIEW  Comparison: Portable exam 1100 hours compared to 12/02/2009  Findings: Enlargement of cardiac silhouette with pulmonary vascular congestion. Atherosclerotic calcification aorta. Minimal bibasilar atelectasis. Lungs otherwise clear. Bones demineralized. No pleural effusion or pneumothorax.  IMPRESSION: Enlargement of cardiac silhouette with pulmonary vascular congestion. Minimal bibasilar atelectasis.  Original Report Authenticated By: Lollie Marrow, M.D.    Dg Abd Acute W/chest  05/15/2012  *RADIOLOGY REPORT*  Clinical Data: Tympanitic abdomen.  Postoperative abdomen. Hypoactive bowel sounds.  Abdominal pain.  ACUTE ABDOMEN SERIES (ABDOMEN 2 VIEW & CHEST 1 VIEW)  Comparison: 05/15/2012 chest radiograph.  Findings: Low lung volumes are present with basilar atelectasis. Cardiopericardial silhouette is unchanged, likely within normal limits allowing for volumes of inspiration.  Cholecystectomy clips are present in the right upper quadrant.  No free air is identified in the abdomen.  Small and large bowel dilation is present with scattered air fluid levels most compatible with ileus in the postoperative setting.  Paucity of rectal gas. Gas and stool is present along the descending colon.  Bilateral right greater than left hip osteoarthritis is incidentally noted. Right sacroiliac joint degenerative disease.  IMPRESSION:  1.  Low volume chest. 2.  Cholecystectomy. 3.  Bowel gas pattern compatible with ileus.   Original Report Authenticated By: Andreas Newport, M.D.    Dg Abd Portable  1v  05/16/2012  *RADIOLOGY REPORT*  Clinical Data: Abdominal pain and distension, recent surgery.  PORTABLE ABDOMEN - 1 VIEW  Comparison: 05/15/2012  Findings: Hemidiaphragms/upper abdomen excluded from the images. Distended loops of large and small bowel again noted.  Surgical clips right upper quadrant.  The organ outlines normal where seen. No acute osseous finding.  IMPRESSION: Gaseous distension of loops of large and small bowel, similar to prior.   Original Report Authenticated By: Waneta Martins, M.D.     MEDICATIONS: Scheduled Meds:    . ciprofloxacin  200 mg Intravenous Q24H  . diltiazem  10 mg Intravenous Once  . insulin aspart  0-9 Units Subcutaneous Q4H  . iohexol  20 mL Oral Q1 Hr x 2  . levothyroxine  26 mcg Intravenous QAC breakfast  . metronidazole  500 mg Intravenous Q8H  . sodium chloride  500 mL Intravenous Once  . sodium chloride  3 mL Intravenous Q12H  . DISCONTD: diltiazem  10 mg Intravenous Once  . DISCONTD: levothyroxine  50 mcg Oral Q breakfast   Continuous Infusions:    . sodium chloride 175 mL/hr at 05/17/12 0400  . diltiazem (CARDIZEM) infusion 15 mg/hr (05/17/12 0400)   PRN Meds:.diphenhydrAMINE, diphenhydrAMINE, hydrALAZINE, labetalol, morphine injection, ondansetron (ZOFRAN) IV, ondansetron, oxyCODONE-acetaminophen, technetium TC 56M mebrofenin  Antibiotics: Anti-infectives     Start     Dose/Rate Route Frequency Ordered Stop   05/16/12 1000   ciprofloxacin (CIPRO) IVPB 200 mg        200 mg 100 mL/hr over 60 Minutes Intravenous Every 24 hours 05/16/12 0846     05/16/12 0800   metroNIDAZOLE (FLAGYL) IVPB 500 mg        500 mg 100 mL/hr over 60 Minutes Intravenous Every 8 hours 05/16/12 0735     05/14/12 0600   ceFAZolin (ANCEF) IVPB 2 g/50 mL premix        2 g 100 mL/hr over 30 Minutes Intravenous On call to O.R. 05/13/12 1424 05/14/12 1407           Jeoffrey Massed, MD  [facility] Pager:[phone]  If 7PM-7AM,  please contact night-coverage [email or web address] Password [email or web address] 05/17/2012, 7:30 AM   LOS: 5 days

## 2012-05-17 NOTE — Anesthesia Postprocedure Evaluation (Signed)
  Anesthesia Post-op Note  Patient: Nicole [name]  Procedure(s) Performed: Procedure(s) (LRB) with comments: ENDOSCOPIC RETROGRADE CHOLANGIOPANCREATOGRAPHY (ERCP) (N/A)  Patient Location: PACU  Anesthesia Type: General  Level of Consciousness: awake, alert  and oriented  Airway and Oxygen Therapy: Patient Spontanous Breathing and Patient connected to nasal cannula oxygen  Post-op Pain: mild  Post-op Assessment: Post-op Vital signs reviewed and Patient's Cardiovascular Status Stable  Post-op Vital Signs: stable  Complications: No apparent anesthesia complications

## 2012-05-18 ENCOUNTER — Inpatient Hospital Stay (HOSPITAL_COMMUNITY): Payer: Medicare Other

## 2012-05-18 LAB — OCCULT BLOOD GASTRIC / DUODENUM (SPECIMEN CUP)

## 2012-05-18 LAB — HEPATIC FUNCTION PANEL
AST: 19 U/L (ref 0–37)
Bilirubin, Direct: 0.3 mg/dL (ref 0.0–0.3)
Indirect Bilirubin: 0.4 mg/dL (ref 0.3–0.9)
Total Bilirubin: 0.7 mg/dL (ref 0.3–1.2)

## 2012-05-18 LAB — RENAL FUNCTION PANEL
Albumin: 2.4 g/dL — ABNORMAL LOW (ref 3.5–5.2)
Albumin: 2.4 g/dL — ABNORMAL LOW (ref 3.5–5.2)
BUN: 29 mg/dL — ABNORMAL HIGH (ref 6–23)
CO2: 40 mEq/L (ref 19–32)
Chloride: 104 mEq/L (ref 96–112)
Chloride: 105 mEq/L (ref 96–112)
Creatinine, Ser: 1.71 mg/dL — ABNORMAL HIGH (ref 0.50–1.10)
GFR calc Af Amer: 29 mL/min — ABNORMAL LOW (ref >90)
GFR calc non Af Amer: 25 mL/min — ABNORMAL LOW (ref >90)
GFR calc non Af Amer: 27 mL/min — ABNORMAL LOW (ref >90)
Phosphorus: 2.8 mg/dL (ref 2.3–4.6)
Potassium: 2.7 mEq/L — CL (ref 3.5–5.1)
Potassium: 2.8 mEq/L — ABNORMAL LOW (ref 3.5–5.1)
Sodium: 151 mEq/L — ABNORMAL HIGH (ref 135–145)

## 2012-05-18 LAB — CBC
MCH: 28 pg (ref 26.0–34.0)
MCHC: 34.1 g/dL (ref 30.0–36.0)
MCV: 82 fL (ref 78.0–100.0)
Platelets: 299 10*3/uL (ref 150–400)
RDW: 15.3 % (ref 11.5–15.5)
WBC: 12.4 10*3/uL — ABNORMAL HIGH (ref 4.0–10.5)

## 2012-05-18 LAB — GLUCOSE, CAPILLARY
Glucose-Capillary: 92 mg/dL (ref 70–99)
Glucose-Capillary: 122 mg/dL — ABNORMAL HIGH (ref 70–99)
Glucose-Capillary: 136 mg/dL — ABNORMAL HIGH (ref 70–99)

## 2012-05-18 MED ORDER — INSULIN REGULAR HUMAN 100 UNIT/ML IJ SOLN
INTRAVENOUS | Status: DC
  Filled 2012-05-18: qty 2000

## 2012-05-18 MED ORDER — POTASSIUM CHLORIDE 10 MEQ/50ML IV SOLN
INTRAVENOUS | Status: AC
  Administered 2012-05-18: 10 meq via INTRAVENOUS
  Filled 2012-05-18: qty 150

## 2012-05-18 MED ORDER — POTASSIUM CHLORIDE 10 MEQ/100ML IV SOLN
10.0000 meq | INTRAVENOUS | Status: DC
  Filled 2012-05-18: qty 300

## 2012-05-18 MED ORDER — POTASSIUM CHLORIDE 10 MEQ/100ML IV SOLN
INTRAVENOUS | Status: AC
  Administered 2012-05-18: 10 meq
  Administered 2012-05-18: 10 meq via INTRAVENOUS
  Filled 2012-05-18: qty 100

## 2012-05-18 MED ORDER — INSULIN REGULAR HUMAN 100 UNIT/ML IJ SOLN
INTRAVENOUS | Status: AC
  Administered 2012-05-18: 18:00:00 via INTRAVENOUS
  Filled 2012-05-18: qty 1000

## 2012-05-18 MED ORDER — PANTOPRAZOLE SODIUM 40 MG IV SOLR
40.0000 mg | Freq: Two times a day (BID) | INTRAVENOUS | Status: DC
  Administered 2012-05-18 – 2012-05-26 (×16): 40 mg via INTRAVENOUS
  Filled 2012-05-18 (×20): qty 40

## 2012-05-18 MED ORDER — POTASSIUM CHLORIDE 2 MEQ/ML IV SOLN
INTRAVENOUS | Status: DC
  Administered 2012-05-18 – 2012-05-21 (×6): via INTRAVENOUS
  Filled 2012-05-18 (×6): qty 1000

## 2012-05-18 MED ORDER — POTASSIUM CHLORIDE 10 MEQ/50ML IV SOLN
10.0000 meq | INTRAVENOUS | Status: AC
  Administered 2012-05-18 (×3): 10 meq via INTRAVENOUS

## 2012-05-18 MED ORDER — POTASSIUM CHLORIDE 2 MEQ/ML IV SOLN
INTRAVENOUS | Status: AC
  Administered 2012-05-18: 10:00:00 via INTRAVENOUS
  Filled 2012-05-18 (×2): qty 1000

## 2012-05-18 MED ORDER — SODIUM CHLORIDE 0.9 % IJ SOLN
10.0000 mL | INTRAMUSCULAR | Status: DC | PRN
  Administered 2012-05-19: 10 mL
  Administered 2012-05-19: 20 mL
  Administered 2012-05-21: 10 mL
  Administered 2012-05-27: 20 mL
  Administered 2012-05-27 (×2): 10 mL
  Administered 2012-05-30: 20 mL
  Administered 2012-05-31 – 2012-06-01 (×7): 10 mL
  Filled 2012-05-18 (×2): qty 20
  Filled 2012-05-18 (×4): qty 10

## 2012-05-18 MED ORDER — POTASSIUM CHLORIDE 10 MEQ/100ML IV SOLN
10.0000 meq | INTRAVENOUS | Status: AC
  Administered 2012-05-18 (×6): 10 meq via INTRAVENOUS
  Filled 2012-05-18: qty 500

## 2012-05-18 MED ORDER — PANTOPRAZOLE SODIUM 40 MG IV SOLR
40.0000 mg | Freq: Two times a day (BID) | INTRAVENOUS | Status: DC
  Filled 2012-05-18 (×2): qty 40

## 2012-05-18 MED ORDER — PROMETHAZINE HCL 25 MG/ML IJ SOLN: 6.2500 mg | Freq: Four times a day (QID) | INTRAMUSCULAR | Status: DC | PRN

## 2012-05-18 MED ORDER — CIPROFLOXACIN IN D5W 400 MG/200ML IV SOLN
400.0000 mg | Freq: Two times a day (BID) | INTRAVENOUS | Status: DC
  Administered 2012-05-18 – 2012-05-20 (×5): 400 mg via INTRAVENOUS
  Filled 2012-05-18 (×6): qty 200

## 2012-05-18 MED ORDER — SODIUM CHLORIDE 0.9 % IJ SOLN
10.0000 mL | Freq: Two times a day (BID) | INTRAMUSCULAR | Status: DC
  Administered 2012-05-18 – 2012-05-19 (×3): 10 mL
  Administered 2012-05-20: 20 mL
  Administered 2012-05-20 – 2012-05-25 (×7): 10 mL
  Administered 2012-05-25: 30 mL
  Administered 2012-05-26: 10 mL
  Filled 2012-05-18 (×2): qty 10
  Filled 2012-05-18: qty 20
  Filled 2012-05-18 (×3): qty 10
  Filled 2012-05-18 (×2): qty 20
  Filled 2012-05-18 (×3): qty 10

## 2012-05-18 NOTE — Progress Notes (Signed)
Subjective: No chest pain, no SOB now with NG and after vomiting.   Objective: Vital signs in last 24 hours: Temp:  [98.2 F (36.8 C)-100 F (37.8 C)] 98.8 F (37.1 C) (10/27 0802) Pulse Rate:  [88-124] 92  (10/27 0802) Resp:  [20-30] 21  (10/27 0802) BP: (126-181)/(66-94) 138/74 mmHg (10/27 0802) SpO2:  [87 %-97 %] 95 % (10/27 0802) Weight change:  Last BM Date: 05/10/12 Intake/Output from previous day:  +465,   Today has had 2800 cc emesis 10/26 0701 - 10/27 0700 In: 4497.9 [I.V.:4197.9; IV Piggyback:300] Out: 3275 [Urine:1575; Emesis/NG output:1700] Intake/Output this shift: Total I/O In: 125 [I.V.:125] Out: 500 [Urine:500]  PE: General:alert and oriented female, currently feeling better. Heart:S1S2 irreg irreg Lungs:fairly clear ZOX:WRUE distention, soft, + pain on rt. abd. Ext:no edema Neuro:oriented   Lab Results:  Basename 05/18/12 0455 05/17/12 0555  WBC 12.4* 13.7*  HGB 9.0* 9.9*  HCT 26.4* 27.4*  PLT 299 266   BMET  Basename 05/18/12 0834 05/18/12 0455  NA 151* 151*  K 2.8* 2.7*  CL 105 104  CO2 40* 38*  GLUCOSE 101* 98  BUN 29* 32*  CREATININE 1.71* 1.82*  CALCIUM 8.2* 8.2*   No results found for this basename: TROPONINI:2,CK,MB:2 in the last 72 hours  Lab Results  Component Value Date   CHOL  Value: 160        ATP III CLASSIFICATION:  <200     mg/dL   Desirable  454-098  mg/dL   Borderline High  >=119    mg/dL   High        07/27/7827   HDL 41 12/26/2008   LDLCALC  Value: 97        Total Cholesterol/HDL:CHD Risk Coronary Heart Disease Risk Table                     Men   Women  1/2 Average Risk   3.4   3.3  Average Risk       5.0   4.4  2 X Average Risk   9.6   7.1  3 X Average Risk  23.4   11.0        Use the calculated Patient Ratio above and the CHD Risk Table to determine the patient's CHD Risk.        ATP III CLASSIFICATION (LDL):  <100     mg/dL   Optimal  562-130  mg/dL   Near or Above                    Optimal  130-159  mg/dL   Borderline   865-784  mg/dL   High  >696     mg/dL   Very High 08/31/5282   TRIG 109 12/26/2008   CHOLHDL 3.9 12/26/2008          EKG: Orders placed during the hospital encounter of 05/12/12  . EKG 12-LEAD  . EKG 12-LEAD  . EKG  . EKG 12-LEAD  . EKG 12-LEAD    Studies/Results: Ct Abdomen Pelvis Wo Contrast  05/16/2012  *RADIOLOGY REPORT*  Clinical Data: Abdominal pain and distention.  Status post cholecystectomy 05/12/2012.  CT ABDOMEN AND PELVIS WITHOUT CONTRAST  Technique:  Multidetector CT imaging of the abdomen and pelvis was performed following the standard protocol without intravenous contrast.  Comparison: CT chest and abdomen 11/08/2006 and CT abdomen and pelvis 02/12/2006.  Findings: The patient has small bilateral pleural effusions, larger on the right.  There  is associated basilar atelectasis, also worse on the right.  Heart size is enlarged.  No pericardial effusion.  NG tube is in place.  The patient has a small bowel obstruction with dilatation of small bowel loops up to 4.4 cm and extensive stool within small bowel.  Distal small bowel loops are completely decompressed.  Transition point is not identified. No pneumatosis or portal venous gas is present.  Small amount of free intraperitoneal air is seen about the liver and likely due to postoperative change.  There is a small amount of perihepatic and free pelvic fluid with increased attenuation consistent with the presence of hemorrhage. Fluid in the pelvis is Hounsfield unit measurements of 36.5.  No focal liver lesions is seen on uninfused examination.  The patient appears to be status post splenectomy with splenosis noted in the left upper quadrant.  The adrenal glands, kidneys and pancreas appear normal.  Atherosclerotic vascular disease without aneurysm of the aorta is identified.  There is no lymphadenopathy. No focal bony abnormality.  IMPRESSION:  1.  Findings consistent with small bowel obstruction likely due to adhesions.  No mass or  evidence of bowel ischemia is present on this uninfused study. 2.  Small to moderate volume of perihepatic and pelvic hemorrhage. Some of the fluid about the liver could be bowel although this is felt less likely. 3.  Status post splenectomy. 4.  Small bilateral pleural effusions.   Original Report Authenticated By: Bernadene Bell. Maricela Curet, M.D.    Nm Hepatobiliary  05/16/2012  *RADIOLOGY REPORT*  Clinical Data:  Postop microscopic cholecystectomy.  Question bile leak.  NUCLEAR MEDICINE HEPATOBILIARY IMAGING  Technique:  Sequential images of the abdomen were obtained out to 60 minutes following intravenous administration of radiopharmaceutical.  Radiopharmaceutical:  5.67mCi Tc-71m Choletec  Comparison:  CT 05/16/2012  Findings: There is rapid clearance of radiotracer from the pool and homogeneous uptake within the liver.  Initially,  bile flows into the small bowel through the common bile duct.  During the lateral part of the first hour of imaging and into the second hour of imaging, radiotracer is noted along the inferior margin of the right hepatic lobe.  Subsequently this radiotracer descends along the right pericolic gutter and begins to outline the lateral margin of the liver.  These findings are consistent with a bile leak.  IMPRESSION:  1.  Bile leak centered along the inferior right hepatic margin and spilling into aathe right pericolic gutter.  2.  Bile  does flow through the common bile duct  into the small bowel.  Findings conveyed to Dr. Jamey Ripa on 05/16/2012 at this 1650 hours   Original Report Authenticated By: Genevive Bi, M.D.    Dg Ercp With Sphincterotomy  05/17/2012  *RADIOLOGY REPORT*  Clinical Data: Common bile duct leak and  ERCP with sphincterotomy  Comparison:  CT 05/16/2012, and hiatus scan 05/16/2012  Technique:  Multiple spot images obtained with the fluoroscopic device and submitted for interpretation post-procedure.  ERCP was performed by Dr. an.  Findings: Two fluoroscopic images  are provided.  There is an endoscope within duodenum.  The common bile duct is cannulated following a small sphincterotomy.  Injection of contrast demonstrates no filling defect.  No evidence of leak from the cystic duct.  A stent was placed by report  IMPRESSION: No evidence of biliary leak.  ERCP with sphincterotomy  and stent placement.  These images were submitted for radiologic interpretation only. Please see the procedural report for the amount of contrast and the  fluoroscopy time utilized.   Original Report Authenticated By: Genevive Bi, M.D.    Dg Abd 2 Views  05/18/2012  *RADIOLOGY REPORT*  Clinical Data: Small bowel obstruction, abdominal pain  ABDOMEN - 2 VIEW  Comparison: CT 05/16/2012  Findings: Nasogastric tube extends into the decompressed stomach. Vascular clips in the right upper abdomen.  No free air on the left lateral decubitus radiograph.  There are few gas dilated small bowel loops in mid abdomen, with fluid levels on the decubitus film.  Normal distribution of gas and stool throughout the nondilated colon.  Plastic endoscopic biliary stent projects in expected location.  Regional bones unremarkable.  IMPRESSION:  1.  Fluid levels in dilated small bowel loops suggesting persistent mid small bowel obstruction. 2.  No free air. 3. Support hardware stable in position.   Original Report Authenticated By: Osa Craver, M.D.     Medications: I have reviewed the patient's current medications. Scheduled Meds:    . ciprofloxacin  200 mg Intravenous Q24H  . insulin aspart  0-9 Units Subcutaneous Q4H  . levothyroxine  26 mcg Intravenous QAC breakfast  . metronidazole  500 mg Intravenous Q8H  . potassium chloride  10 mEq Intravenous Q1 Hr x 2  . potassium chloride  10 mEq Intravenous Q1 Hr x 6  . potassium chloride      . sodium chloride  3 mL Intravenous Q12H   Continuous Infusions:    . dextrose 5 % and 0.45% NaCl 1,000 mL with potassium chloride 40 mEq infusion 125  mL/hr at 05/18/12 0945  . dextrose 5 % and 0.45% NaCl 1,000 mL with potassium chloride 40 mEq infusion    . diltiazem (CARDIZEM) infusion 15 mg/hr (05/18/12 0900)  . TPN (CLINIMIX) +/- additives    . DISCONTD: sodium chloride 125 mL/hr at 05/18/12 0636  . DISCONTD: sodium chloride 175 mL/hr at 05/17/12 0400   PRN Meds:.diphenhydrAMINE, diphenhydrAMINE, hydrALAZINE, labetalol, morphine injection, ondansetron (ZOFRAN) IV, ondansetron, phenol, DISCONTD: acetaminophen, DISCONTD:  HYDROmorphone (DILAUDID) injection, DISCONTD: iohexol, DISCONTD: ondansetron (ZOFRAN) IV, DISCONTD: oxyCODONE-acetaminophen  Assessment/Plan: Principal Problem:  *Respiratory distress Active Problems:  History of colon cancer, stage III  Cholecystitis chronic, acute, S/P lap cholecystectomy 05/14/13  Thoracic aortic aneurysm, 4.8cm 2011  CHF, past NICM with an EF of 30%, most recent echo 2011 showed EF to be45- 50%  Chronic atrial fibrillation, not felt to be a Coumadin candidate in the past  Acute on chronic renal insufficiency  Normal coronary arteries, 2008  Diabetes mellitus  HTN (hypertension)  ARF (acute renal failure)  Atrial fibrillation with RVR  Postoperative ileus  Small bowel obstruction due to adhesions  Bile leak, postoperative  Hypokalemia   LOS: 39 days   78 year old woman with a history for likely diastolic and mild systolic dysfunction. Followed by Dr. Clarene Duke for chronic atrial fibrillation who was admitted for microscopic cholecystectomy. We are consult to help with management of atrial fibrillation. Admitted for Acute on Chronic Cholecystitis -- S/P Lap Cholecystectomy 10/23 and ERCP with stent placement on 10/26. Has NG to suction in place for abdominal distention and SBO/ileus.  Renal function has dramatically improved. UOP stable.  Has CAF - on home diltiazem dose of 360 mg -- now replicated with IV gtt. HR is relatively well controlled in 80s to low 100s.  Remains NPO with plan for TPN  -- will need to monitor fluid status closely with her baseline diastolic dysfunction.  If additional heart rate control is required would use IV Lopressor starting 2.5  mg every 6 hours as blood pressure tolerates.   Anticipate improved heart rate once her discomfort is resolved.  We will be following along & available as needed.  Will round QOD unless her condition worsens.  Marykay Lex, M.D., M.S. [facility] [address] [place], [place]  [address]  [phone] Pager # [phone] 05/18/2012 10:46 AM

## 2012-05-18 NOTE — Progress Notes (Signed)
1 Day Post-Op  Subjective: Resting quietly this morning, offers no c/o. Appears comfortable at present.  Objective: Vital signs in last 24 hours: Temp:  [98.1 F (36.7 C)-100 F (37.8 C)] 99.5 F (37.5 C) (10/27 0333) Pulse Rate:  [88-124] 93  (10/27 0333) Resp:  [20-30] 21  (10/27 0333) BP: (126-181)/(66-94) 148/71 mmHg (10/27 0333) SpO2:  [87 %-97 %] 93 % (10/27 0333) Last BM Date: 05/10/12  Intake/Output from previous day: 10/26 0701 - 10/27 0700 In: 4372.9 [I.V.:4072.9; IV Piggyback:300] Out: 3275 [Urine:1575; Emesis/NG output:1700] Intake/Output this shift:    General appearance: alert, cooperative, appears stated age, fatigued and no distress Chest: CTA bilaterally Cardiac: A-fib rate HR 90's on cardiazem drip Abdomen: Tympanic, distended, DSG C/D/I no drainage, NG remains in place and is functioning well ( of dark bilious output) No BS.  Abdominal films done today: IMPRESSION:  1. Fluid levels in dilated small bowel loops suggesting persistent  mid small bowel obstruction.  2. No free air.  3. Support hardware stable in position. Extremities: warm, no edema, + pulses bilaterally no tenderness. Hypokalemic (2.7) receiving repletion. Hypernatremic (IVF has been changed to 0.45NS by renal) Will recheck NA+ and K+ with afternoon BMP. WBC trending down, H&H, PLT's stable. BUN and Creat improving.   Lab Results:   Basename 05/18/12 0455 05/17/12 0555  WBC 12.4* 13.7*  HGB 9.0* 9.9*  HCT 26.4* 27.4*  PLT 299 266   BMET  Basename 05/18/12 0455 05/17/12 0555  NA 151* 146*  K 2.7* 3.1*  CL 104 104  CO2 38* 32  GLUCOSE 98 141*  BUN 32* 43*  CREATININE 1.82* 3.01*  CALCIUM 8.2* 7.7*   PT/INR No results found for this basename: LABPROT:2,INR:2 in the last 72 hours ABG  Basename 05/16/12 0253  PHART 7.438  HCO3 25.4*    Studies/Results: Ct Abdomen Pelvis Wo Contrast  05/16/2012  *RADIOLOGY REPORT*  Clinical Data: Abdominal pain and distention.   Status post cholecystectomy 05/12/2012.  CT ABDOMEN AND PELVIS WITHOUT CONTRAST  Technique:  Multidetector CT imaging of the abdomen and pelvis was performed following the standard protocol without intravenous contrast.  Comparison: CT chest and abdomen 11/08/2006 and CT abdomen and pelvis 02/12/2006.  Findings: The patient has small bilateral pleural effusions, larger on the right.  There is associated basilar atelectasis, also worse on the right.  Heart size is enlarged.  No pericardial effusion.  NG tube is in place.  The patient has a small bowel obstruction with dilatation of small bowel loops up to 4.4 cm and extensive stool within small bowel.  Distal small bowel loops are completely decompressed.  Transition point is not identified. No pneumatosis or portal venous gas is present.  Small amount of free intraperitoneal air is seen about the liver and likely due to postoperative change.  There is a small amount of perihepatic and free pelvic fluid with increased attenuation consistent with the presence of hemorrhage. Fluid in the pelvis is Hounsfield unit measurements of 36.5.  No focal liver lesions is seen on uninfused examination.  The patient appears to be status post splenectomy with splenosis noted in the left upper quadrant.  The adrenal glands, kidneys and pancreas appear normal.  Atherosclerotic vascular disease without aneurysm of the aorta is identified.  There is no lymphadenopathy. No focal bony abnormality.  IMPRESSION:  1.  Findings consistent with small bowel obstruction likely due to adhesions.  No mass or evidence of bowel ischemia is present on this uninfused study. 2.  Small  to moderate volume of perihepatic and pelvic hemorrhage. Some of the fluid about the liver could be bowel although this is felt less likely. 3.  Status post splenectomy. 4.  Small bilateral pleural effusions.   Original Report Authenticated By: Bernadene Bell. Maricela Curet, M.D.    Nm Hepatobiliary  05/16/2012  *RADIOLOGY  REPORT*  Clinical Data:  Postop microscopic cholecystectomy.  Question bile leak.  NUCLEAR MEDICINE HEPATOBILIARY IMAGING  Technique:  Sequential images of the abdomen were obtained out to 60 minutes following intravenous administration of radiopharmaceutical.  Radiopharmaceutical:  5.35mCi Tc-42m Choletec  Comparison:  CT 05/16/2012  Findings: There is rapid clearance of radiotracer from the pool and homogeneous uptake within the liver.  Initially,  bile flows into the small bowel through the common bile duct.  During the lateral part of the first hour of imaging and into the second hour of imaging, radiotracer is noted along the inferior margin of the right hepatic lobe.  Subsequently this radiotracer descends along the right pericolic gutter and begins to outline the lateral margin of the liver.  These findings are consistent with a bile leak.  IMPRESSION:  1.  Bile leak centered along the inferior right hepatic margin and spilling into aathe right pericolic gutter.  2.  Bile  does flow through the common bile duct  into the small bowel.  Findings conveyed to Dr. Jamey Ripa on 05/16/2012 at this 1650 hours   Original Report Authenticated By: Genevive Bi, M.D.    Dg Ercp With Sphincterotomy  05/17/2012  *RADIOLOGY REPORT*  Clinical Data: Common bile duct leak and  ERCP with sphincterotomy  Comparison:  CT 05/16/2012, and hiatus scan 05/16/2012  Technique:  Multiple spot images obtained with the fluoroscopic device and submitted for interpretation post-procedure.  ERCP was performed by Dr. an.  Findings: Two fluoroscopic images are provided.  There is an endoscope within duodenum.  The common bile duct is cannulated following a small sphincterotomy.  Injection of contrast demonstrates no filling defect.  No evidence of leak from the cystic duct.  A stent was placed by report  IMPRESSION: No evidence of biliary leak.  ERCP with sphincterotomy  and stent placement.  These images were submitted for radiologic  interpretation only. Please see the procedural report for the amount of contrast and the fluoroscopy time utilized.   Original Report Authenticated By: Genevive Bi, M.D.    Dg Abd 2 Views  05/18/2012  *RADIOLOGY REPORT*  Clinical Data: Small bowel obstruction, abdominal pain  ABDOMEN - 2 VIEW  Comparison: CT 05/16/2012  Findings: Nasogastric tube extends into the decompressed stomach. Vascular clips in the right upper abdomen.  No free air on the left lateral decubitus radiograph.  There are few gas dilated small bowel loops in mid abdomen, with fluid levels on the decubitus film.  Normal distribution of gas and stool throughout the nondilated colon.  Plastic endoscopic biliary stent projects in expected location.  Regional bones unremarkable.  IMPRESSION:  1.  Fluid levels in dilated small bowel loops suggesting persistent mid small bowel obstruction. 2.  No free air. 3. Support hardware stable in position.   Original Report Authenticated By: Osa Craver, M.D.     Anti-infectives: Anti-infectives     Start     Dose/Rate Route Frequency Ordered Stop   05/16/12 1000   ciprofloxacin (CIPRO) IVPB 200 mg        200 mg 100 mL/hr over 60 Minutes Intravenous Every 24 hours 05/16/12 0846     05/16/12 0800  metroNIDAZOLE (FLAGYL) IVPB 500 mg        500 mg 100 mL/hr over 60 Minutes Intravenous Every 8 hours 05/16/12 0735     05/14/12 0600   ceFAZolin (ANCEF) IVPB 2 g/50 mL premix        2 g 100 mL/hr over 30 Minutes Intravenous On call to O.R. 05/13/12 1424 05/14/12 1407          Assessment/Plan:  Patient Active Problem List  Diagnosis  . Anemia  . History of colon cancer, stage III  . Abdominal pain, acute, right upper quadrant  . Nausea and vomiting  . Dehydration  . Cholecystitis chronic, acute, S/P lap cholecystectomy 05/14/13  . Thoracic aortic aneurysm, 4.8cm 2011  . CHF, past NICM with an EF of 30%, most recent echo 2011 showed EF to be45- 50%  . Chronic atrial  fibrillation, not felt to be a Coumadin candidate in the past  . Acute on chronic renal insufficiency  . Normal coronary arteries, 2008  . Diabetes mellitus  . HTN (hypertension)  . Respiratory distress  . ARF (acute renal failure)  . Atrial fibrillation with RVR  . Postoperative ileus  . Small bowel obstruction due to adhesions  . Bile leak, postoperative  . Hypokalemia  Hypernatremia s/p Procedure(s) (LRB) with comments: ENDOSCOPIC RETROGRADE CHOLANGIOPANCREATOGRAPHY (ERCP) (N/A)   Plan: 1. Keep NPO 2. Replete K+  3. Management of her renal and cardiac issues per nephrology and cardiology respectively 4. Continue to follow clinical presentation  .  LOS: 6 days    Golda Acre [facility] Surgery Pager # [phone] 05/18/2012

## 2012-05-18 NOTE — Progress Notes (Signed)
CRITICAL VALUE ALERT  Critical value received:  k 2.7  Date of notification:  05/18/12  Time of notification:  0630  Critical value read back:yes  Nurse who received alert:  Zohal Reny rn  MD notified (1st page):  Mary lynch pa  Time of first page:  [phone]  MD notified (2nd page):  Time of second page: Responding MD:  Elray Mcgregor PA   Time MD responded:  [phone] Dorie Rank

## 2012-05-18 NOTE — Progress Notes (Signed)
[facility]  Subjective:  Awake, alert, still complains of abd. Distention.  NG on suction. Says she lives by herself in [address] [facility]. )   Objective: Vital signs in last 24 hours: Blood pressure 138/74, pulse 92, temperature 98.8 F (37.1 C), temperature source Oral, resp. rate 21, height 5\' 10"  (1.778 m), weight 84 kg (185 lb 3 oz), SpO2 95.00%.    PHYSICAL EXAM General--as above, NG suction Chest--clear  Heart--no rub  Abd--distended, tender, no rebound  Extr--no edema    Lab Results:   Lab 05/18/12 0834 05/18/12 0455 05/17/12 0555  NA 151* 151* 146*  K 2.8* 2.7* 3.1*  CL 105 104 104  CO2 40* 38* 32  BUN 29* 32* 43*  CREATININE 1.71* 1.82* 3.01*  ALB -- -- --  GLUCOSE 101* -- --  CALCIUM 8.2* 8.2* 7.7*  PHOS 2.6 2.8 3.4     Basename 05/18/12 0455 05/17/12 0555  WBC 12.4* 13.7*  HGB 9.0* 9.9*  HCT 26.4* 27.4*  PLT 299 266     I have reviewed the patient's current medications. Receiving IV KCl, 1/2 NS  + 40 KCl at 100/hr cipro + flagyl  Assessment/Plan: 1.  Acute Kidney Injury, hemodynamically mediated - improving, creat down to 1.7  today with improved UOP. Continue fluids--changed to     1/2 NS with rising serum Na. Cr was 1.17 on 11 May 2012 2.  Severe systemic arterial hypertension --not that high currently  3.  Hypovolemia due to gastrointestinal losses- getting IV  1/2 NS at 100 cc/hr today.  4.  Post op Cholecystectomy-- CT showed SBO "..secondary to adhesions."; on cipro + flagyl. Had ERCP without evid of bile leak--stent      placed in CBD.  5.  Afib/RVR--on cardizem drip, No heparin 6.  Anemia--will check Fe studies with AM lab     LOS: 6 days   Icie Kuznicki F 05/18/2012,10:11 AM   .labalb

## 2012-05-18 NOTE — Progress Notes (Signed)
Peripherally Inserted Central Catheter/Midline Placement  The IV Nurse has discussed with the patient and/or persons authorized to consent for the patient, the purpose of this procedure and the potential benefits and risks involved with this procedure.  The benefits include less needle sticks, lab draws from the catheter and patient may be discharged home with the catheter.  Risks include, but not limited to, infection, bleeding, blood clot (thrombus formation), and puncture of an artery; nerve damage and irregular heat beat.  Alternatives to this procedure were also discussed.  PICC/Midline Placement Documentation        Nicole [name] 05/18/2012, 11:55 AM

## 2012-05-18 NOTE — Progress Notes (Signed)
Agree with A&P of JD,PA Reviewed xrays - she did pass a little gas, she is hypokalemic. I am inclined to believe she has ileus from bile peritonitis and hypokalemia. Her abd is clearly improved over last 24-48 H. Will recheck KUB in AM.  She is going to start some TNA as well as it is not clear how long until she is able to eat and whether she will need another procedure Discussed with Dr Jerral Ralph

## 2012-05-18 NOTE — Progress Notes (Signed)
Patient ID: Nicole [name], female   DOB: 1930/11/29, 78 y.o.   MRN: [id number] [facility] Gastroenterology Progress Note  Nicole [name] 78 y.o. Apr 16, 1931   Subjective: Nurse reports patient is doing good. S/P ERCP with stent placement for bile leak yesterday. Unable to examine pt or talk to her because having PICC line placed at this time.  Objective: Vital signs in last 24 hours: Filed Vitals:   05/18/12 1103  BP: 138/74  Pulse: 92  Temp: 98.4 F (36.9 C)  Resp: 21    Physical Exam: Unable to examine due to sterile PICC line placement  Lab Results:  [facility] 05/18/12 0834 05/18/12 0455 05/17/12 0555  NA 151* 151* --  K 2.8* 2.7* --  CL 105 104 --  CO2 40* 38* --  GLUCOSE 101* 98 --  BUN 29* 32* --  CREATININE 1.71* 1.82* --  CALCIUM 8.2* 8.2* --  MG -- -- 1.7  PHOS 2.6 2.8 --    Basename 05/18/12 0834 05/18/12 0455 05/17/12 0555  AST -- 19 20  ALT -- 7 9  ALKPHOS -- 66 66  BILITOT -- 0.7 0.7  PROT -- 5.9* 5.7*  ALBUMIN 2.4* 2.4*2.4* --    Basename 05/18/12 0455 05/17/12 0555 05/16/12 0143  WBC 12.4* 13.7* --  NEUTROABS -- -- 12.9*  HGB 9.0* 9.9* --  HCT 26.4* 27.4* --  MCV 82.0 82.0 --  PLT 299 266 --   No results found for this basename: LABPROT:2,INR:2 in the last 72 hours    Assessment/Plan: S/P ERCP yesterday with stent placement for bile leak. LFTs normal. WBC trending down. Hgb 9.0. Continue supportive care. Advance diet per surgery recs. Dr. Dulce Sellar to see tomorrow.   Nicole Aday C. 05/18/2012, 11:27 AM

## 2012-05-18 NOTE — Progress Notes (Addendum)
PARENTERAL NUTRITION CONSULT NOTE - INITIAL  Pharmacy Consult for TPN, Ciprofloxacin Indication: small bowel obstruction  Allergies  Allergen Reactions  . Iohexol      Code: VOM, Desc: PT REPORTS VOMITING W/ IVP DYE- ARS 12/26/08, Onset Date: [id number]     Patient Measurements: Height: 5\' 10"  (177.8 cm) Weight: 185 lb 3 oz (84 kg) IBW/kg (Calculated) : 68.5  Adjusted Body Weight: 74kg  Vital Signs: Temp: 98.8 F (37.1 C) (10/27 0802) Temp src: Oral (10/27 0802) BP: 138/74 mmHg (10/27 0802) Pulse Rate: 92  (10/27 0802) Intake/Output from previous day: 10/26 0701 - 10/27 0700 In: 4497.9 [I.V.:4197.9; IV Piggyback:300] Out: 3275 [Urine:1575; Emesis/NG output:1700] Intake/Output from this shift: Total I/O In: 125 [I.V.:125] Out: 500 [Urine:500]  Labs:  [facility] 05/18/12 0455 05/17/12 0555 05/16/12 0700  WBC 12.4* 13.7* 13.9*  HGB 9.0* 9.9* 12.3  HCT 26.4* 27.4* 34.7*  PLT 299 266 281  APTT -- -- --  INR -- -- --     Basename 05/18/12 0455 05/17/12 0555 05/16/12 0700  NA 151* 146* 141  K 2.7* 3.1* 3.8  CL 104 104 100  CO2 38* 32 24  GLUCOSE 98 141* 189*  BUN 32* 43* 35*  CREATININE 1.82* 3.01* 3.36*  LABCREA -- -- --  CREAT24HRUR -- -- --  CALCIUM 8.2* 7.7* 8.7  MG -- 1.7 --  PHOS 2.8 3.4 --  PROT 5.9* 5.7* --  ALBUMIN 2.4*2.4* 2.4* --  AST 19 20 --  ALT 7 9 --  ALKPHOS 66 66 --  BILITOT 0.7 0.7 --  BILIDIR 0.3 0.3 --  IBILI 0.4 0.4 --  PREALBUMIN -- -- --  TRIG -- -- --  CHOLHDL -- -- --  CHOL -- -- --   Estimated Creatinine Clearance: 29.6 ml/min (by C-G formula based on Cr of 1.82).    Basename 05/18/12 0333 05/18/12 0020 05/17/12 2033  GLUCAP 100* 105* 115*    Medical History: Past Medical History  Diagnosis Date  . Colon cancer 07/1997  . Hypertension   . Diabetes mellitus   . CHF (congestive heart failure)   . AAA (abdominal aortic aneurysm)   . Atrial fibrillation   . Hypercholesteremia   . Kidney stone     renal calculi  .  Small bowel obstruction due to adhesions 05/16/2012    Medications:  Prescriptions prior to admission  Medication Sig Dispense Refill  . allopurinol (ZYLOPRIM) 100 MG tablet Take 100 mg by mouth as needed. For gout      . amLODipine (NORVASC) 10 MG tablet Take 10 mg by mouth daily.        Marland Kitchen atorvastatin (LIPITOR) 40 MG tablet Take 40 mg by mouth daily.        Marland Kitchen diltiazem (CARDIZEM CD) 360 MG 24 hr capsule Take 360 mg by mouth daily.        . furosemide (LASIX) 40 MG tablet Take 40 mg by mouth daily.      Marland Kitchen glipiZIDE (GLUCOTROL) 10 MG tablet Take 10 mg by mouth daily.        Marland Kitchen levothyroxine (SYNTHROID, LEVOTHROID) 50 MCG tablet Take 50 mcg by mouth daily.        . magnesium oxide (MAG-OX) 400 MG tablet Take 400 mg by mouth daily.      . metFORMIN (GLUCOPHAGE) 500 MG tablet Take 500 mg by mouth 2 (two) times daily with a meal.        . potassium chloride (K-DUR) 10 MEQ tablet Take 10 mEq  by mouth daily.          Insulin Requirements in the past 24 hours:  1 unit given  Current Nutrition:  NPO - has D545NaCl with kcl at 165mL/hr  Assessment: 78 year old woman who is s/p cholecyctectomy for cholelithiasis on 10/23.  She had abdominal distension post op and radiology studies revealed a possible SBO.  An NG tube was placed which improved her symptoms.  On 10/26, she underwent an ERCP and had a stent placed in her bile duct for a possible leak.  Unfortunately, she still has evidence of SBO/ileus today and TPN is ordered to start today. Nutritional Goals:  1800 kCal, 90 grams of protein per day   GI: S/P Lap Cholecystectomy 10/23 and ERCP with stent placement on 10/26.  Has NG to suction in place for abdominal distention and SBO/ileus.  On no acid suppression currently. Endo: History of DM2 - on metformin and glpizide prior to admission.  CBGs well controlled yesterday 100-146.  On sliding scale insulin only. Lytes: K 2.8, Mg 1.7 (on 10/26), Phos 2.6, Na 151.  6 runs of potassium ordered  and 40 meq potassium added to IV fluids this morning. Renal: Had some acute renal failure post op due to hemodynamic renal injury.  This is improving steadily with Scr down to 1.71 today. Pulm: 95% on 3L Cards: On diltiazem drip for Afib with RVR.  Not an anticoagulation candidate currently due to hematoma and possible need for repeat surgery.  Was not on warfarin prior to admission either. Hepatobil: AST, ALT, TBili, Alk Phos WNL Neuro: OK ID:   Cipro/Flagyl since 10/25.  Afebrile.  WBC 12.7.  Urine culture from 1025 is no growth.  No other cultures.  Renal function improving - will increase Cipro to 400mg  IV q12. Best Practices: SCD for VTE prophylaxis TPN Access:  PICC ordered for 10/27 TPN day#: 1  Start Clinimix E 5/15 at 42 mL/hr and decrease IV fluids to 80 mL/hr when TPN starts.  Will add 15 units of insulin to the TPN bag given her history of diabetes.  6 runs of KCL have been ordered by MD.  Mg and Phos do not require replacement at this time.  Will continue CBG q4h with sliding scale insulin coverage.  PICC line ordered.  I contacted the IV team to make sure that they were aware.  Hopeful that this will be placed before 6pm today.  Mickeal Skinner 05/18/2012,9:28 AM

## 2012-05-18 NOTE — Progress Notes (Signed)
PATIENT DETAILS Name: Nicole [name] Age: 78 y.o. Sex: female Date of Birth: 1930-10-13 Admit Date: 05/12/2012 Admitting Physician Currie Paris, MD ONG:EXBM, [name], DO  Subjective: Continues to improve  Assessment/Plan: Principal Problem: Abdominal Distention -2/2 SBO/Ileus-post operative--following Lap Cholecystectomy 10/23 -c/w NPO status, NG tube, and supportive care -belly less distended and less tender compared to 10/25 -if continues to be NPO-then may need to start TNA  -Bile Leak -following Lap Cholecystectomy 10/23 -S/P ERCP with Stent placement 10/26 -Afebrile, slight decrease in WBC today -belly less tender -c/e empiric Cipro and Flagyl  Abdominal Wall hematoma -H/H decreased today-suspect from IVF rather than bleeding -monitor H&H  daily -may need transfusion if this trend continues  ARF -appreciate Renal eval -suspicion for hemodynaminc mediated renal injury-as patient was very briefly hypotensive on 10/24-retrospectively-hypotension probably was a result from developing abd wall hematoma/?intra-abdominal pathology -creatinine now starting to downtrend -will need to start decreasing IVF  Afib RVR -c/w cardizem gtt -not a candidate for anticoagulation-given hematoma/potential for repeart surgery, more importantly has been deemed not a be candidate for anticoagulation by her cardiologist in the past as well-?non compliance -cards following  Resp Failure -hypoxic-stable with just 2-3 L of O2 -suspect 2/2 to abdominal pain/distention/atelectasis/mild pul congestion -monitor for now  Cholecystitis -s/p Lap cholecystectomy on 10/23 -unfortunately complicated by abdominal wall hematoma and bile leak-see above  Hypokalemia -2/2 to GI loss via NG suction -replete and recheck  Hypernatremia -will c/w 1/2 NS for now, recheck lytes-if Na still increasing then will change to D5W  DM -holding all oral agents -CBG's relatively stable -will be  NPO-so just c/w SSI for now  CHF -both systolic and diastolic dysfunction -still compensated-although some mild pulmonary edema of CXR -cards following  HTN -given hypotension on 10/24- and resultant ARF-holding all antihypertensives -monitor and restart IV meds as needed  Thoracic Aortic Aneurysm -outpatient follow up  H/O Stage III colon Ca -outpatient follow up with Oncology  Disposition: Remain inpatient  DVT Prophylaxis: scd's  Code Status: Full code   Procedures:  Laparoscopic Cholecystectomy 10/23   ERCP 10/26  CONSULTS:  cardiology, nephrology, GI and general surgery  PHYSICAL EXAM: Vital signs in last 24 hours: Filed Vitals:   05/17/12 2037 05/18/12 0023 05/18/12 0333 05/18/12 0802  BP: 142/66 126/68 148/71   Pulse: 100 88 93   Temp: 99.1 F (37.3 C) 99.3 F (37.4 C) 99.5 F (37.5 C) 98.8 F (37.1 C)  TempSrc: Oral Oral Oral Oral  Resp: 25 20 21    Height:      Weight:      SpO2: 91% 94% 93%     Weight change:  Body mass index is 26.57 kg/(m^2).   Gen Exam: Awake and alert with clear speech.   Neck: Supple, No JVD.   Chest: B/L Clear.   CVS: S1 S2 Regular, tachycardia.  Abdomen: soft, BS +,less tender and less distended today Extremities: no edema, lower extremities warm to touch. Neurologic: Non Focal.   Skin: No Rash.   Wounds: N/A.    Intake/Output from previous day:  Intake/Output Summary (Last 24 hours) at 05/18/12 0827 Last data filed at 05/18/12 0802  Gross per 24 hour  Intake 4197.92 ml  Output   3050 ml  Net 1147.92 ml     LAB RESULTS: CBC  Lab 05/18/12 0455 05/17/12 0555 05/16/12 0700 05/16/12 0143 05/15/12 2317 05/15/12 1114 05/12/12 1943 05/11/12 2352  WBC 12.4* 13.7* 13.9* 15.4* -- 9.5 -- --  HGB 9.0* 9.9* 12.3  11.9* 11.6* -- -- --  HCT 26.4* 27.4* 34.7* 33.5* 33.2* -- -- --  PLT 299 266 281 247 -- 252 -- --  MCV 82.0 82.0 82.6 83.1 -- 84.3 -- --  MCH 28.0 29.6 29.3 29.5 -- 29.0 -- --  MCHC 34.1 36.1* 35.4  35.5 -- 34.4 -- --  RDW 15.3 15.4 15.4 15.4 -- 15.5 -- --  LYMPHSABS -- -- -- 0.7 -- -- 1.0 1.4  MONOABS -- -- -- 1.8* -- -- 0.8 0.6  EOSABS -- -- -- 0.0 -- -- 0.0 0.0  BASOSABS -- -- -- 0.0 -- -- 0.0 0.0  BANDABS -- -- -- -- -- -- -- --    Chemistries   Lab 05/18/12 0455 05/17/12 0555 05/16/12 0700 05/15/12 1114 05/15/12 0610 05/14/12 0550  NA 151* 146* 141 138 140 --  K 2.7* 3.1* 3.8 3.9 4.2 --  CL 104 104 100 100 102 --  CO2 38* 32 24 25 26  --  GLUCOSE 98 141* 189* 178* 158* --  BUN 32* 43* 35* 24* 21 --  CREATININE 1.82* 3.01* 3.36* 2.81* 2.33* --  CALCIUM 8.2* 7.7* 8.7 8.3* 8.2* --  MG -- 1.7 -- -- -- 1.9    CBG:  Lab 05/18/12 0333 05/18/12 0020 05/17/12 2033 05/17/12 1609 05/17/12 1219  GLUCAP 100* 105* 115* 121* 137*    GFR Estimated Creatinine Clearance: 29.6 ml/min (by C-G formula based on Cr of 1.82).  Coagulation profile  Lab 05/13/12 0645  INR 1.11  PROTIME --    Cardiac Enzymes No results found for this basename: CK:3,CKMB:3,TROPONINI:3,MYOGLOBIN:3 in the last 168 hours  No components found with this basename: POCBNP:3 No results found for this basename: DDIMER:2 in the last 72 hours No results found for this basename: HGBA1C:2 in the last 72 hours No results found for this basename: CHOL:2,HDL:2,LDLCALC:2,TRIG:2,CHOLHDL:2,LDLDIRECT:2 in the last 72 hours No results found for this basename: TSH,T4TOTAL,FREET3,T3FREE,THYROIDAB in the last 72 hours No results found for this basename: VITAMINB12:2,FOLATE:2,FERRITIN:2,TIBC:2,IRON:2,RETICCTPCT:2 in the last 72 hours No results found for this basename: LIPASE:2,AMYLASE:2 in the last 72 hours  Urine Studies No results found for this basename: UACOL:2,UAPR:2,USPG:2,UPH:2,UTP:2,UGL:2,UKET:2,UBIL:2,UHGB:2,UNIT:2,UROB:2,ULEU:2,UEPI:2,UWBC:2,URBC:2,UBAC:2,CAST:2,CRYS:2,UCOM:2,BILUA:2 in the last 72 hours  MICROBIOLOGY: Recent Results (from the past 240 hour(s))  SURGICAL PCR SCREEN     Status: Abnormal    Collection Time   05/14/12  4:10 AM      Component Value Range Status Comment   MRSA, PCR NEGATIVE  NEGATIVE Final    Staphylococcus aureus POSITIVE (*) NEGATIVE Final   URINE CULTURE     Status: Normal   Collection Time   05/16/12  1:50 AM      Component Value Range Status Comment   Specimen Description URINE, CATHETERIZED   Final    Special Requests NONE   Final    Culture  Setup Time 05/16/2012 02:27   Final    Colony Count NO GROWTH   Final    Culture NO GROWTH   Final    Report Status 05/16/2012 FINAL   Final     RADIOLOGY STUDIES/RESULTS: Dg Cholangiogram Operative  05/14/2012  *RADIOLOGY REPORT*  Clinical Data: Laparoscopic cholecystectomy  INTRAOPERATIVE CHOLANGIOGRAM  Comparison:  Abdominal ultrasound - 05/12/2012  Findings:  Intraoperative angiographic images of the right upper abdominal quadrant during laparoscopic cholecystectomy are provided for review.  Surgical clips overlie the expected location of the gallbladder fossa.  Contrast injection demonstrates selective cannulation of the central aspect of the cystic duct.  There is brisk passage of contrast through the  central aspect of the cystic duct with filling of a mildly dilated common bile duct. There is brisk passage of contrast though the CBD and into the descending portion of the duodenum.  There is minimal reflux of injected contrast into the common hepatic duct and central aspect of the nondilated intrahepatic biliary system.  There are no discrete filling defects within the opacified portions of the biliary system to suggest the presence of choledocholithiasis.  IMPRESSION:  Intraoperative cholangiogram as above.  No discrete filling defects to suggest the presence of choledocholithiasis.   Original Report Authenticated By: Waynard Reeds, M.D.    US Abdomen Complete  05/12/2012  *RADIOLOGY REPORT*  Clinical Data:  Upper abdominal pain.  Concern for gallstones. History of splenectomy.  COMPLETE ABDOMINAL ULTRASOUND   Comparison:  CT 11/08/2006  Findings:  Gallbladder:  There is a 0.9 cm stone near the gallbladder neck. There is no evidence for gallbladder wall thickening.  The patient does not have a sonographic Murphy's sign.  Common bile duct:  Measures 0.5 cm.  Liver:  The liver is echogenic and slightly heterogeneous.  IVC:  Appears normal.  Pancreas:  Poorly visualized due to bowel gas.  Spleen:  The patient had a splenectomy but there is a round structure in the left upper quadrant consistent with a splenule. Splenule measures up to 2.4 cm and this was present on the prior examination.  Right Kidney:  Right kidney measures 10.1 cm in length without hydronephrosis.  Left Kidney:  Left kidney is difficult to evaluate and roughly measures 9.6 cm in length without hydronephrosis.  Abdominal aorta: Retroperitoneal structures are difficult to evaluate.  Cannot exclude aneurysmal dilatation of the mid abdominal aorta.  IMPRESSION: There is a 0.9 cm gallstone near the gallbladder neck.  No evidence for biliary dilatation.  Limited evaluation of the abdominal aorta.  There is concern for aneurysmal dilatation in the mid abdominal aorta.  This could be better evaluated with CT.   Original Report Authenticated By: Richarda Overlie, M.D.    US Renal  05/15/2012  *RADIOLOGY REPORT*  Clinical Data: Acute renal failure  RENAL/URINARY TRACT ULTRASOUND COMPLETE  Comparison:  Abdominal ultrasound 05/12/2012, 11/08/2006 CT  Findings:  Right Kidney:  10.5 cm. No hydronephrosis or focal mass.  Left Kidney:  10.4 cm. No hydronephrosis or focal mass.  Bladder:  Normal in appearance.  Ascites is noted.  IMPRESSION: Normal exam.  No hydronephrosis or solid renal mass.  Ascites.   Original Report Authenticated By: Harrel Lemon, M.D.    Dg Chest Port 1 View  05/16/2012  *RADIOLOGY REPORT*  Clinical Data: Shortness of breath.  Status post cholecystectomy.  PORTABLE CHEST - 1 VIEW  Comparison: Chest and two views abdomen 05/15/2012 and plain  film of the chest 12/02/2009.  Findings: Again seen is cardiomegaly.  Lung volumes are low with basilar atelectasis.  Calcified granuloma left midlung again identified.  No pneumothorax.  IMPRESSION:  1.  Bibasilar atelectasis in a low-volume chest. 2.  Cardiomegaly without edema.   Original Report Authenticated By: Bernadene Bell. D'ALESSIO, M.D.    Dg Chest Port 1 View  05/15/2012  *RADIOLOGY REPORT*  Clinical Data: Hypoxia, CHF, lower abdominal and right shoulder pain, 2 days post cholecystectomy, history hypertension, CHF, atrial fibrillation, diabetes  PORTABLE CHEST - 1 VIEW  Comparison: Portable exam 1100 hours compared to 12/02/2009  Findings: Enlargement of cardiac silhouette with pulmonary vascular congestion. Atherosclerotic calcification aorta. Minimal bibasilar atelectasis. Lungs otherwise clear. Bones demineralized. No pleural effusion  or pneumothorax.  IMPRESSION: Enlargement of cardiac silhouette with pulmonary vascular congestion. Minimal bibasilar atelectasis.   Original Report Authenticated By: Lollie Marrow, M.D.    Dg Abd Acute W/chest  05/15/2012  *RADIOLOGY REPORT*  Clinical Data: Tympanitic abdomen.  Postoperative abdomen. Hypoactive bowel sounds.  Abdominal pain.  ACUTE ABDOMEN SERIES (ABDOMEN 2 VIEW & CHEST 1 VIEW)  Comparison: 05/15/2012 chest radiograph.  Findings: Low lung volumes are present with basilar atelectasis. Cardiopericardial silhouette is unchanged, likely within normal limits allowing for volumes of inspiration.  Cholecystectomy clips are present in the right upper quadrant.  No free air is identified in the abdomen.  Small and large bowel dilation is present with scattered air fluid levels most compatible with ileus in the postoperative setting.  Paucity of rectal gas. Gas and stool is present along the descending colon.  Bilateral right greater than left hip osteoarthritis is incidentally noted. Right sacroiliac joint degenerative disease.  IMPRESSION:  1.  Low volume  chest. 2.  Cholecystectomy. 3.  Bowel gas pattern compatible with ileus.   Original Report Authenticated By: Andreas Newport, M.D.    Dg Abd Portable 1v  05/16/2012  *RADIOLOGY REPORT*  Clinical Data: Abdominal pain and distension, recent surgery.  PORTABLE ABDOMEN - 1 VIEW  Comparison: 05/15/2012  Findings: Hemidiaphragms/upper abdomen excluded from the images. Distended loops of large and small bowel again noted.  Surgical clips right upper quadrant.  The organ outlines normal where seen. No acute osseous finding.  IMPRESSION: Gaseous distension of loops of large and small bowel, similar to prior.   Original Report Authenticated By: Waneta Martins, M.D.     MEDICATIONS: Scheduled Meds:    . ciprofloxacin  200 mg Intravenous Q24H  . diltiazem  10 mg Intravenous Once  . insulin aspart  0-9 Units Subcutaneous Q4H  . levothyroxine  26 mcg Intravenous QAC breakfast  . metronidazole  500 mg Intravenous Q8H  . potassium chloride  10 mEq Intravenous Q1 Hr x 2  . potassium chloride  10 mEq Intravenous Q1 Hr x 6  . potassium chloride      . sodium chloride  3 mL Intravenous Q12H   Continuous Infusions:    . dextrose 5 % and 0.45% NaCl 1,000 mL with potassium chloride 40 mEq infusion    . diltiazem (CARDIZEM) infusion 15 mg/hr (05/18/12 0447)  . DISCONTD: sodium chloride 125 mL/hr at 05/18/12 0636  . DISCONTD: sodium chloride 175 mL/hr at 05/17/12 0400   PRN Meds:.diphenhydrAMINE, diphenhydrAMINE, hydrALAZINE, labetalol, morphine injection, ondansetron (ZOFRAN) IV, ondansetron, oxyCODONE-acetaminophen, phenol, DISCONTD: acetaminophen, DISCONTD:  HYDROmorphone (DILAUDID) injection, DISCONTD: iohexol, DISCONTD: ondansetron (ZOFRAN) IV  Antibiotics: Anti-infectives     Start     Dose/Rate Route Frequency Ordered Stop   05/16/12 1000   ciprofloxacin (CIPRO) IVPB 200 mg        200 mg 100 mL/hr over 60 Minutes Intravenous Every 24 hours 05/16/12 0846     05/16/12 0800   metroNIDAZOLE  (FLAGYL) IVPB 500 mg        500 mg 100 mL/hr over 60 Minutes Intravenous Every 8 hours 05/16/12 0735     05/14/12 0600   ceFAZolin (ANCEF) IVPB 2 g/50 mL premix        2 g 100 mL/hr over 30 Minutes Intravenous On call to O.R. 05/13/12 1424 05/14/12 1407           Jeoffrey Massed, MD  [facility] Pager:[phone]  If 7PM-7AM, please contact night-coverage [email or web address] Password [facility] 05/18/2012, 8:27 AM  LOS: 6 days

## 2012-05-19 ENCOUNTER — Inpatient Hospital Stay (HOSPITAL_COMMUNITY): Payer: Medicare Other

## 2012-05-19 LAB — BASIC METABOLIC PANEL
BUN: 25 mg/dL — ABNORMAL HIGH (ref 6–23)
BUN: 21 mg/dL (ref 6–23)
Calcium: 8.4 mg/dL (ref 8.4–10.5)
Calcium: 8.4 mg/dL (ref 8.4–10.5)
Chloride: 103 mEq/L (ref 96–112)
Chloride: 106 mEq/L (ref 96–112)
Creatinine, Ser: 1.54 mg/dL — ABNORMAL HIGH (ref 0.50–1.10)
Creatinine, Ser: 1.17 mg/dL — ABNORMAL HIGH (ref 0.50–1.10)
GFR calc Af Amer: 36 mL/min — ABNORMAL LOW (ref >90)
GFR calc Af Amer: 50 mL/min — ABNORMAL LOW (ref >90)
GFR calc non Af Amer: 43 mL/min — ABNORMAL LOW (ref >90)

## 2012-05-19 LAB — COMPREHENSIVE METABOLIC PANEL
AST: 15 U/L (ref 0–37)
Albumin: 2.2 g/dL — ABNORMAL LOW (ref 3.5–5.2)
Alkaline Phosphatase: 68 U/L (ref 39–117)
BUN: 22 mg/dL (ref 6–23)
CO2: 35 mEq/L — ABNORMAL HIGH (ref 19–32)
Chloride: 105 mEq/L (ref 96–112)
GFR calc non Af Amer: 40 mL/min — ABNORMAL LOW (ref >90)
Potassium: 3.2 mEq/L — ABNORMAL LOW (ref 3.5–5.1)
Total Bilirubin: 0.7 mg/dL (ref 0.3–1.2)

## 2012-05-19 LAB — CBC
HCT: 24.8 % — ABNORMAL LOW (ref 36.0–46.0)
Hemoglobin: 8.7 g/dL — ABNORMAL LOW (ref 12.0–15.0)
MCH: 29.1 pg (ref 26.0–34.0)
MCHC: 35.1 g/dL (ref 30.0–36.0)
RDW: 15.3 % (ref 11.5–15.5)

## 2012-05-19 LAB — GLUCOSE, CAPILLARY
Glucose-Capillary: 163 mg/dL — ABNORMAL HIGH (ref 70–99)
Glucose-Capillary: 176 mg/dL — ABNORMAL HIGH (ref 70–99)
Glucose-Capillary: 187 mg/dL — ABNORMAL HIGH (ref 70–99)
Glucose-Capillary: 203 mg/dL — ABNORMAL HIGH (ref 70–99)

## 2012-05-19 LAB — DIFFERENTIAL
Basophils Relative: 0 % (ref 0–1)
Eosinophils Absolute: 0.1 10*3/uL (ref 0.0–0.7)
Eosinophils Relative: 1 % (ref 0–5)
Monocytes Absolute: 2.6 10*3/uL — ABNORMAL HIGH (ref 0.1–1.0)
Monocytes Relative: 18 % — ABNORMAL HIGH (ref 3–12)

## 2012-05-19 MED ORDER — POTASSIUM CHLORIDE 10 MEQ/50ML IV SOLN
INTRAVENOUS | Status: AC
  Administered 2012-05-19: 10 meq via INTRAVENOUS
  Filled 2012-05-19: qty 50

## 2012-05-19 MED ORDER — MAGNESIUM SULFATE 40 MG/ML IJ SOLN
2.0000 g | Freq: Once | INTRAMUSCULAR | Status: AC
  Administered 2012-05-19: 2 g via INTRAVENOUS
  Filled 2012-05-19: qty 50

## 2012-05-19 MED ORDER — POTASSIUM CHLORIDE 10 MEQ/100ML IV SOLN: 10.0000 meq | INTRAVENOUS | Status: DC

## 2012-05-19 MED ORDER — ALTEPLASE 100 MG IV SOLR
2.0000 mg | Freq: Once | INTRAVENOUS | Status: AC
  Administered 2012-05-19: 2 mg
  Filled 2012-05-19: qty 2

## 2012-05-19 MED ORDER — POTASSIUM CHLORIDE 10 MEQ/50ML IV SOLN
10.0000 meq | INTRAVENOUS | Status: AC
  Administered 2012-05-19 (×5): 10 meq via INTRAVENOUS

## 2012-05-19 MED ORDER — SODIUM CHLORIDE 0.9 % IJ SOLN
INTRAMUSCULAR | Status: AC
  Administered 2012-05-19: 10 mL
  Filled 2012-05-19: qty 10

## 2012-05-19 MED ORDER — FAT EMULSION 20 % IV EMUL
250.0000 mL | INTRAVENOUS | Status: AC
  Administered 2012-05-19: 250 mL via INTRAVENOUS
  Filled 2012-05-19: qty 250

## 2012-05-19 MED ORDER — TRACE MINERALS CR-CU-F-FE-I-MN-MO-SE-ZN IV SOLN
INTRAVENOUS | Status: AC
  Administered 2012-05-19: 18:00:00 via INTRAVENOUS
  Filled 2012-05-19: qty 1000

## 2012-05-19 MED ORDER — POTASSIUM PHOSPHATE DIBASIC 3 MMOLE/ML IV SOLN
15.0000 mmol | Freq: Once | INTRAVENOUS | Status: AC
  Administered 2012-05-19: 15 mmol via INTRAVENOUS
  Filled 2012-05-19: qty 5

## 2012-05-19 NOTE — Progress Notes (Signed)
PT Cancellation Note  Patient Details Name: JANAVIA ROTTMAN MRN: [id number] DOB: 24-Apr-1931   Cancelled Treatment:  Pt just back to bed and is worn out, will return as able. 05/19/2012  Manville Bing, PT [phone] (pager)        Ece Cumberland, Eliseo Gum 05/19/2012, 5:58 PM

## 2012-05-19 NOTE — Progress Notes (Signed)
Subjective: Interval History: has no complaint of has started passing gas.  Objective: Vital signs in last 24 hours: Temp:  [98.1 F (36.7 C)-99.1 F (37.3 C)] 99.1 F (37.3 C) (10/28 0729) Pulse Rate:  [82-108] 82  (10/28 0729) Resp:  [20-28] 20  (10/28 0729) BP: (117-144)/(64-79) 117/64 mmHg (10/28 0729) SpO2:  [94 %-98 %] 98 % (10/28 0729) Weight change:   Intake/Output from previous day: 10/27 0701 - 10/28 0700 In: 3366 [I.V.:2360; IV Piggyback:460; TPN:546] Out: 4575 [Urine:2475; Emesis/NG output:2100] Intake/Output this shift: Total I/O In: 237 [I.V.:95; IV Piggyback:100; TPN:42] Out: 250 [Urine:250]  General appearance: cooperative, pale, slowed mentation and peiorbital edema Resp: diminished breath sounds bilaterally Cardio: S1, S2 normal and prematures GI: distended, no bs, incisions Extremities: extremities normal, atraumatic, no cyanosis or edema  Lab Results:  Basename 05/19/12 0415 05/18/12 0455  WBC 14.4* 12.4*  HGB 8.7* 9.0*  HCT 24.8* 26.4*  PLT 285 299   BMET:  Basename 05/19/12 0415 05/18/12 1604  NA 145 149*  K 3.2* 3.0*  CL 105 103  CO2 35* 40*  GLUCOSE 156* 144*  BUN 22 25*  CREATININE 1.24* 1.54*  CALCIUM 8.5 8.4   No results found for this basename: PTH:2 in the last 72 hours Iron Studies: No results found for this basename: IRON,TIBC,TRANSFERRIN,FERRITIN in the last 72 hours  Studies/Results: Dg Chest Port 1 View  05/18/2012  *RADIOLOGY REPORT*  Clinical Data: Central line placement  PORTABLE CHEST - 1 VIEW  Comparison: 05/16/2012  Findings: Right arm PICC line extends to the low SVC.  Heart size upper limits normal for technique.  Low volumes with perihilar and bibasilar subsegmental atelectasis or infiltrate, left greater than right.  No effusion.  Nasogastric tube extends at least as far as the stomach, tip not seen.  IMPRESSION:  1.  PICC line to low SVC.   Original Report Authenticated By: Osa Craver, M.D.    Dg Abd 2  Views  05/18/2012  *RADIOLOGY REPORT*  Clinical Data: Small bowel obstruction, abdominal pain  ABDOMEN - 2 VIEW  Comparison: CT 05/16/2012  Findings: Nasogastric tube extends into the decompressed stomach. Vascular clips in the right upper abdomen.  No free air on the left lateral decubitus radiograph.  There are few gas dilated small bowel loops in mid abdomen, with fluid levels on the decubitus film.  Normal distribution of gas and stool throughout the nondilated colon.  Plastic endoscopic biliary stent projects in expected location.  Regional bones unremarkable.  IMPRESSION:  1.  Fluid levels in dilated small bowel loops suggesting persistent mid small bowel obstruction. 2.  No free air. 3. Support hardware stable in position.   Original Report Authenticated By: Osa Craver, M.D.    Dg Abd Portable 2v  05/19/2012  *RADIOLOGY REPORT*  Clinical Data: Ileus versus small bowel obstruction.  PORTABLE ABDOMEN - 2 VIEW  Comparison: 05/18/2012.  Findings: There are dilated loops of small bowel in the central abdomen, measuring up to 4.7 cm in diameter.  Nasogastric terminates in the stomach.  Minimal colonic gas.  There is gas in the rectum.  IMPRESSION: Bowel gas pattern appears relatively unchanged from 05/18/2012, favoring a small bowel obstruction.   Original Report Authenticated By: Reyes Ivan, M.D.     I have reviewed the patient's current medications.  Assessment/Plan: 1 AKI resolved, vol xs mild.  K being replete, losing in NG and renal recovery 2 Anemia stable 3S/P chole per surgery P lower ivf, will s/o  for now.    LOS: 7 days   Tylisha Danis L 05/19/2012,11:38 AM

## 2012-05-19 NOTE — Progress Notes (Signed)
Agree with above.  Con't NGT for now.  May be just ileus.  Replete K+ to help with ileus.  Mobilize with PT.

## 2012-05-19 NOTE — Progress Notes (Signed)
Critical lab for CO2 of 40 called for 1600 lab draw on 05/18/12. Day shift nurse was aware. Doctor was aware. No patient manifestations. Will continue to monitor.   Kathlene Cote A RN

## 2012-05-19 NOTE — Progress Notes (Signed)
Subjective: Passing a little flatus.  No bowel movement. Abdominal pain and distention better.  Objective: Vital signs in last 24 hours: Temp:  [98.1 F (36.7 C)-99.1 F (37.3 C)] 99.1 F (37.3 C) (10/28 0729) Pulse Rate:  [82-108] 82  (10/28 0729) Resp:  [20-28] 20  (10/28 0729) BP: (117-144)/(64-79) 117/64 mmHg (10/28 0729) SpO2:  [94 %-98 %] 98 % (10/28 0729) Weight change:  Last BM Date: 05/10/12  PE: GEN:  A bit somnolent appearing, but arousable. HEENT:  NGT in place, canister almost full with bilious-appearing effluent ABD:  Distended, protuberant, non-tender, scant bowel sounds  Lab Results: CMP     Component Value Date/Time   NA 145 05/19/2012 0415   K 3.2* 05/19/2012 0415   CL 105 05/19/2012 0415   CO2 35* 05/19/2012 0415   GLUCOSE 156* 05/19/2012 0415   BUN 22 05/19/2012 0415   CREATININE 1.24* 05/19/2012 0415   CALCIUM 8.5 05/19/2012 0415   PROT 5.8* 05/19/2012 0415   ALBUMIN 2.2* 05/19/2012 0415   AST 15 05/19/2012 0415   ALT 6 05/19/2012 0415   ALKPHOS 68 05/19/2012 0415   BILITOT 0.7 05/19/2012 0415   GFRNONAA 40* 05/19/2012 0415   GFRAA 47* 05/19/2012 0415   CBC    Component Value Date/Time   WBC 14.4* 05/19/2012 0415   WBC 7.2 08/09/2011 1012   RBC 2.99* 05/19/2012 0415   RBC 4.20 08/09/2011 1012   HGB 8.7* 05/19/2012 0415   HGB 11.5* 08/09/2011 1012   HCT 24.8* 05/19/2012 0415   HCT 35.0 08/09/2011 1012   PLT 285 05/19/2012 0415   PLT 352 08/09/2011 1012   MCV 82.9 05/19/2012 0415   MCV 83.3 08/09/2011 1012   MCH 29.1 05/19/2012 0415   MCH 27.4 08/09/2011 1012   MCHC 35.1 05/19/2012 0415   MCHC 32.9 08/09/2011 1012   RDW 15.3 05/19/2012 0415   RDW 18.0* 08/09/2011 1012   LYMPHSABS 0.8 05/19/2012 0415   LYMPHSABS 1.2 08/09/2011 1012   MONOABS 2.6* 05/19/2012 0415   MONOABS 0.8 08/09/2011 1012   EOSABS 0.1 05/19/2012 0415   EOSABS 0.1 08/09/2011 1012   BASOSABS 0.0 05/19/2012 0415   BASOSABS 0.0 08/09/2011 1012   Assessment:  1.  Partial  small bowel obstruction versus ileus.  I would favor bile leak-associated ileus.  NGT in place.  Starting to pass some flatus. 2.  Bile leak, post cholecystectomy, seen on HIDA scan.  ERCP with biliary stent placed by Dr. Madilyn Fireman 10/26. 3.  Abdominal pain, likely multifactorial (#1 and #2 above), improving.  Plan:  1.  Supportive management of bile ileus versus partial small bowel obstruction, per CCS. 2.  Plan to keep biliary stent in place for about 6-8 weeks, then remove electively as outpatient and consider repeat cholangiogram. 3.  Will sign-off; will arrange outpatient follow-up with Dr. Madilyn Fireman for biliary stent removal (as above); please call with questions; thank you for the consult.   Nicole [name] 05/19/2012, 11:59 AM

## 2012-05-19 NOTE — Progress Notes (Signed)
Subjective: 2 Days post op, stent placed in CBD 5 days post op  Lap chole  NPO continues  Objective: Vital signs in last 24 hours: Temp:  [98.1 F (36.7 C)-99.1 F (37.3 C)] 99.1 F (37.3 C) (10/28 0729) Pulse Rate:  [82-109] 82  (10/28 0729) Resp:  [20-28] 20  (10/28 0729) BP: (117-149)/(64-79) 117/64 mmHg (10/28 0729) SpO2:  [94 %-98 %] 98 % (10/28 0729) Weight change:  Last BM Date: 05/10/12 Intake/Output from previous day: -1221 10/27 0701 - 10/28 0700 In: 3366 [I.V.:2360; IV Piggyback:460; TPN:546] Out: 1610 [RUEAV:4098; Emesis/NG output:2100] Intake/Output this shift: Total I/O In: 237 [I.V.:95; IV Piggyback:100; TPN:42] Out: 250 [Urine:250]  PE: General:up in chair, no specific complaints Heart:S1S2 irreg irreg Lungs:clear JXB:JYNWGNFAO NG in place Ext:no edema Neuro:alert and oriented   Lab Results:  Basename 05/19/12 0415 05/18/12 0455  WBC 14.4* 12.4*  HGB 8.7* 9.0*  HCT 24.8* 26.4*  PLT 285 299   BMET  Basename 05/19/12 0415 05/18/12 1604  NA 145 149*  K 3.2* 3.0*  CL 105 103  CO2 35* 40*  GLUCOSE 156* 144*  BUN 22 25*  CREATININE 1.24* 1.54*  CALCIUM 8.5 8.4   No results found for this basename: TROPONINI:2,CK,MB:2 in the last 72 hours  Lab Results  Component Value Date   CHOL 111 05/19/2012   HDL 41 12/26/2008   LDLCALC  Value: 97        Total Cholesterol/HDL:CHD Risk Coronary Heart Disease Risk Table                     Men   Women  1/2 Average Risk   3.4   3.3  Average Risk       5.0   4.4  2 X Average Risk   9.6   7.1  3 X Average Risk  23.4   11.0        Use the calculated Patient Ratio above and the CHD Risk Table to determine the patient's CHD Risk.        ATP III CLASSIFICATION (LDL):  <100     mg/dL   Optimal  130-865  mg/dL   Near or Above                    Optimal  130-159  mg/dL   Borderline  784-696  mg/dL   High  >295     mg/dL   Very High 08/30/4130   TRIG 72 05/19/2012   CHOLHDL 3.9 12/26/2008   Lab Results  Component Value Date     HGBA1C  Value: 6.7 (NOTE) The ADA recommends the following therapeutic goal for glycemic control related to Hgb A1c measurement: Goal of therapy: <6.5 Hgb A1c  Reference: American Diabetes Association: Clinical Practice Recommendations 2010, Diabetes Care, 2010, 33: (Suppl  1).* 12/26/2008       Hepatic Function Panel  Basename 05/19/12 0415 05/18/12 0455  PROT 5.8* --  ALBUMIN 2.2* --  AST 15 --  ALT 6 --  ALKPHOS 68 --  BILITOT 0.7 --  BILIDIR -- 0.3  IBILI -- 0.4    Basename 05/19/12 0415  CHOL 111   No results found for this basename: PROTIME in the last 72 hours    EKG: Orders placed during the hospital encounter of 05/12/12  . EKG 12-LEAD  . EKG 12-LEAD  . EKG  . EKG 12-LEAD  . EKG 12-LEAD    Studies/Results: Dg Chest Port 1 View  05/18/2012  *  RADIOLOGY REPORT*  Clinical Data: Central line placement  PORTABLE CHEST - 1 VIEW  Comparison: 05/16/2012  Findings: Right arm PICC line extends to the low SVC.  Heart size upper limits normal for technique.  Low volumes with perihilar and bibasilar subsegmental atelectasis or infiltrate, left greater than right.  No effusion.  Nasogastric tube extends at least as far as the stomach, tip not seen.  IMPRESSION:  1.  PICC line to low SVC.   Original Report Authenticated By: Osa Craver, M.D.    Dg Abd 2 Views  05/18/2012  *RADIOLOGY REPORT*  Clinical Data: Small bowel obstruction, abdominal pain  ABDOMEN - 2 VIEW  Comparison: CT 05/16/2012  Findings: Nasogastric tube extends into the decompressed stomach. Vascular clips in the right upper abdomen.  No free air on the left lateral decubitus radiograph.  There are few gas dilated small bowel loops in mid abdomen, with fluid levels on the decubitus film.  Normal distribution of gas and stool throughout the nondilated colon.  Plastic endoscopic biliary stent projects in expected location.  Regional bones unremarkable.  IMPRESSION:  1.  Fluid levels in dilated small bowel loops  suggesting persistent mid small bowel obstruction. 2.  No free air. 3. Support hardware stable in position.   Original Report Authenticated By: Osa Craver, M.D.    Dg Abd Portable 2v  05/19/2012  *RADIOLOGY REPORT*  Clinical Data: Ileus versus small bowel obstruction.  PORTABLE ABDOMEN - 2 VIEW  Comparison: 05/18/2012.  Findings: There are dilated loops of small bowel in the central abdomen, measuring up to 4.7 cm in diameter.  Nasogastric terminates in the stomach.  Minimal colonic gas.  There is gas in the rectum.  IMPRESSION: Bowel gas pattern appears relatively unchanged from 05/18/2012, favoring a small bowel obstruction.   Original Report Authenticated By: Reyes Ivan, M.D.     Medications: I have reviewed the patient's current medications.    . ciprofloxacin  400 mg Intravenous Q12H  . insulin aspart  0-9 Units Subcutaneous Q4H  . levothyroxine  26 mcg Intravenous QAC breakfast  . magnesium sulfate 1 - 4 g bolus IVPB  2 g Intravenous Once  . metronidazole  500 mg Intravenous Q8H  . pantoprazole (PROTONIX) IV  40 mg Intravenous Q12H  . potassium chloride  10 mEq Intravenous Q1 Hr x 6  . potassium chloride  10 mEq Intravenous Q1 Hr x 3  . potassium chloride  10 mEq Intravenous Q1 Hr x 5  . potassium chloride      . potassium phosphate IVPB (mmol)  15 mmol Intravenous Once  . sodium chloride  10-40 mL Intracatheter Q12H  . DISCONTD: ciprofloxacin  200 mg Intravenous Q24H  . DISCONTD: pantoprazole (PROTONIX) IV  40 mg Intravenous Q12H  . DISCONTD: potassium chloride  10 mEq Intravenous Q1 Hr x 3  . DISCONTD: potassium chloride  10 mEq Intravenous Q1 Hr x 5  . DISCONTD: sodium chloride  3 mL Intravenous Q12H   Assessment/Plan: Principal Problem:  *Respiratory distress Active Problems:  History of colon cancer, stage III  Cholecystitis chronic, acute, S/P lap cholecystectomy 05/14/13  Thoracic aortic aneurysm, 4.8cm 2011  CHF, past NICM with an EF of 30%, most  recent echo 2011 showed EF to be45- 50%  Chronic atrial fibrillation, not felt to be a Coumadin candidate in the past  Acute on chronic renal insufficiency  Normal coronary arteries, 2008  Diabetes mellitus  HTN (hypertension)  ARF (acute renal failure)  Atrial  fibrillation with RVR  Postoperative ileus  Small bowel obstruction due to adhesions  Bile leak, postoperative  Hypokalemia  PLAN: Continue IV dilt at current dose until able to take meds po.  LOS: 7 days   INGOLD,LAURA R 05/19/2012, 10:59 AM  I have seen & examined the patient this PM after Ms. Ingold.  I agree with her findings,exam & recommendations as noted above.  She looks overall more comfortable, which is reflected in her improved HR control. Continues to be on "home" dose of CCB as IV -- until taking PO.   When able to take PO meds, will need to overlap PO with IV x ~1-2 hr to ensure continued coverage.  Marykay Lex, M.D., M.S. [facility] [address] [place], [place]  [address]  [phone] Pager # [phone] 05/19/2012 1:53 PM

## 2012-05-19 NOTE — Progress Notes (Signed)
PATIENT DETAILS Name: Nicole [name] Age: 78 y.o. Sex: female Date of Birth: October 02, 1930 Admit Date: 05/12/2012 Admitting Physician Currie Paris, MD ZOX:[id number], [facility], DO  Brief Narrative: 78 year old female who was admitted on 05/12/2012 with 2-3 days of persistent right upper quadrant abdominal pain worse after eating. She had been evaluated in the emergency department 24 hours before admission and ultrasound revealed symptomatic cholelithiasis. She had been evaluated by surgery that time and plans were to proceed with elective cholecystectomy after patient stopped her Plavix and aspirin for one week. Unfortunately she returned to the ER because of uncontrolled pain with vomiting. sHe subsequently underwent a laparoscopic cholecystectomy on 05/14/2012. Postoperatively She developed severe abdominal pain associated with nausea and vomiting. HIDA scan revealed bile leak. In the interim she developed atrial fibrillation with RVR and was subsequentLY started on Cardizem drip. CT scan performed demonstrated small bowel obstruction that warranted NG tube placement and surgical consultation. She has subsequently undergone ERCP with stent placement on 05/17/2012.  Assessment/Plan:  Abdominal Distention 2/2 post operative SBO/ileus - Appreciate surgical team assistance -c/w NPO status, NG tube, and supportive care -belly less distended and less tender compared to 10/25 patient reports passing some flatus -if continues to be NPO may need to start TNA  -Bile Leak -following Lap Cholecystectomy 10/23 -S/P ERCP with Stent placement 10/26 -Afebrile, had slight increase in leukocytosis -belly less tender -empiric Cipro and Flagyl  Abdominal Wall hematoma -H/H decreased today-suspect from IVF rather than bleeding -monitor H&H  daily -may need transfusion if this trend continues  ARF -appreciate Renal eval -suspicion for hemodynamic mediated renal injury-as patient was very briefly  hypotensive on 10/24-retrospectively-hypotension probably was a result from developing abd wall hematoma/?intra-abdominal pathology -creatinine now starting to downtrend -will need to start decreasing IVF  Afib RVR -c/w cardizem gtt -not a candidate for anticoagulation-given hematoma/potential for repeart surgery, more importantly has been deemed not a be candidate for anticoagulation by her cardiologist in the past as well-?non compliance -cards following  Resp Failure -hypoxic-stable with just 2-3 L of O2 -suspect 2/2 to abdominal pain/distention/atelectasis/mild pul congestion -monitor for now  Cholecystitis -s/p Lap cholecystectomy on 10/23 -unfortunately complicated by abdominal wall hematoma and bile leak-see above  Hypokalemia -2/2 to GI loss via NG suction -replete and recheck - goal is K+ of 4.0 or >  Hypernatremia -will c/w 1/2 NS for now, recheck lytes-if Na still increasing then will change to D5W  DM -holding all oral agents -CBG's relatively stable -will be NPO-so just c/w SSI for now  CHF (systolic EF 45% and diastolic dysfunction-grade 1) -compensated -cards following -moderate TR due to pulmonary HTN  HTN -given hypotension on 10/24- and resultant ARF-holding all antihypertensives -monitor and restart IV meds as needed  Thoracic Aortic Aneurysm -outpatient follow up  H/O Stage III colon Ca -outpatient follow up with Oncology  Disposition: Remain inpatient DVT Prophylaxis: scd's Code Status: Full code   Procedures:  Laparoscopic Cholecystectomy 10/23   ERCP 10/26  CONSULTS: cardiology  Nephrology GI  general surgery  Subjective: Up in chair. Denies significant pain. Reports flatus. States mouth is dry and is very thirsty and is looking forward to being able to drink a glass of water  PHYSICAL EXAM: Vital signs in last 24 hours: Filed Vitals:   05/19/12 0029 05/19/12 0413 05/19/12 0729 05/19/12 1127  BP: 141/74 144/74 117/64 124/65    Pulse: 89 84 82 77  Temp: 98.4 F (36.9 C) 98.6 F (37 C) 99.1 F (37.3 C) 99.1  F (37.3 C)  TempSrc: Oral Oral Oral Oral  Resp: 21 28 20 21   Height:      Weight:      SpO2: 94% 96% 98% 98%    Weight change:  Body mass index is 26.57 kg/(m^2).   Gen Exam: Awake and alert with clear speech.   Neck: Supple, No JVD.   Chest: B/L Clear.   CVS: S1 S2 Regular, tachycardia.  Abdomen: soft, BS +,less tender and less distended today Extremities: no edema, lower extremities warm to touch. Neurologic: Non Focal.   Skin: No Rash.   Wounds: N/A.    Intake/Output from previous day:  Intake/Output Summary (Last 24 hours) at 05/19/12 1421 Last data filed at 05/19/12 1400  Gross per 24 hour  Intake   3555 ml  Output   3325 ml  Net    230 ml    LAB RESULTS: CBC  Lab 05/19/12 0415 05/18/12 0455 05/17/12 0555 05/16/12 0700 05/16/12 0143 05/12/12 1943  WBC 14.4* 12.4* 13.7* 13.9* 15.4* --  HGB 8.7* 9.0* 9.9* 12.3 11.9* --  HCT 24.8* 26.4* 27.4* 34.7* 33.5* --  PLT 285 299 266 281 247 --  MCV 82.9 82.0 82.0 82.6 83.1 --  MCH 29.1 28.0 29.6 29.3 29.5 --  MCHC 35.1 34.1 36.1* 35.4 35.5 --  RDW 15.3 15.3 15.4 15.4 15.4 --  LYMPHSABS 0.8 -- -- -- 0.7 1.0  MONOABS 2.6* -- -- -- 1.8* 0.8  EOSABS 0.1 -- -- -- 0.0 0.0  BASOSABS 0.0 -- -- -- 0.0 0.0  BANDABS -- -- -- -- -- --    Chemistries   Lab 05/19/12 0415 05/18/12 1604 05/18/12 0834 05/18/12 0455 05/17/12 0555 05/14/12 0550  NA 145 149* 151* 151* 146* --  K 3.2* 3.0* 2.8* 2.7* 3.1* --  CL 105 103 105 104 104 --  CO2 35* 40* 40* 38* 32 --  GLUCOSE 156* 144* 101* 98 141* --  BUN 22 25* 29* 32* 43* --  CREATININE 1.24* 1.54* 1.71* 1.82* 3.01* --  CALCIUM 8.5 8.4 8.2* 8.2* 7.7* --  MG 1.7 -- -- -- 1.7 1.9    CBG:  Lab 05/19/12 1125 05/19/12 0740 05/19/12 0412 05/19/12 0025 05/18/12 2026  GLUCAP 200* 187* 163* 176* 174*    GFR Estimated Creatinine Clearance: 43.4 ml/min (by C-G formula based on Cr of  1.24).  Coagulation profile  Lab 05/13/12 0645  INR 1.11  PROTIME --    Basename 05/19/12 0415  CHOL 111  HDL --  LDLCALC --  TRIG 72  CHOLHDL --  LDLDIRECT --   MICROBIOLOGY: Recent Results (from the past 240 hour(s))  SURGICAL PCR SCREEN     Status: Abnormal   Collection Time   05/14/12  4:10 AM      Component Value Range Status Comment   MRSA, PCR NEGATIVE  NEGATIVE Final    Staphylococcus aureus POSITIVE (*) NEGATIVE Final   URINE CULTURE     Status: Normal   Collection Time   05/16/12  1:50 AM      Component Value Range Status Comment   Specimen Description URINE, CATHETERIZED   Final    Special Requests NONE   Final    Culture  Setup Time 05/16/2012 02:27   Final    Colony Count NO GROWTH   Final    Culture NO GROWTH   Final    Report Status 05/16/2012 FINAL   Final    MEDICATIONS: Reviewed by M.D.  Junious Silk L.,ANP  [facility] Pager:[phone]  If 7PM-7AM, please contact night-coverage [email or web address] Password [email or web address] 05/19/2012, 2:21 PM   LOS: 7 days    I have personally examined this patient and reviewed the entire database. I have reviewed the above note, made any necessary editorial changes, and agree with its content.  Lonia Blood, MD [facility]

## 2012-05-19 NOTE — Progress Notes (Signed)
PARENTERAL NUTRITION CONSULT NOTE - FOLLOW UP  Pharmacy Consult for TPN Indication: small bowel obstruction  Allergies  Allergen Reactions  . Iohexol      Code: VOM, Desc: PT REPORTS VOMITING W/ IVP DYE- ARS 12/26/08, Onset Date: [id number]     Patient Measurements: Height: 5\' 10"  (177.8 cm) Weight: 185 lb 3 oz (84 kg) IBW/kg (Calculated) : 68.5  Adjusted Body Weight: 74 kg  Vital Signs: Temp: 99.1 F (37.3 C) (10/28 0729) Temp src: Oral (10/28 0729) BP: 117/64 mmHg (10/28 0729) Pulse Rate: 82  (10/28 0729) Intake/Output from previous day: 10/27 0701 - 10/28 0700 In: 3366 [I.V.:2360; IV Piggyback:460; TPN:546] Out: 4575 [Urine:2475; Emesis/NG output:2100] Intake/Output from this shift: Total I/O In: 237 [I.V.:95; IV Piggyback:100; TPN:42] Out: 250 [Urine:250]  Labs:  [facility] 05/19/12 0415 05/18/12 0455 05/17/12 0555  WBC 14.4* 12.4* 13.7*  HGB 8.7* 9.0* 9.9*  HCT 24.8* 26.4* 27.4*  PLT 285 299 266  APTT -- -- --  INR -- -- --     Basename 05/19/12 0415 05/18/12 1604 05/18/12 0834 05/18/12 0455 05/17/12 0555  NA 145 149* 151* -- --  K 3.2* 3.0* 2.8* -- --  CL 105 103 105 -- --  CO2 35* 40* 40* -- --  GLUCOSE 156* 144* 101* -- --  BUN 22 25* 29* -- --  CREATININE 1.24* 1.54* 1.71* -- --  LABCREA -- -- -- -- --  CREAT24HRUR -- -- -- -- --  CALCIUM 8.5 8.4 8.2* -- --  MG 1.7 -- -- -- 1.7  PHOS 1.6* -- 2.6 2.8 --  PROT 5.8* -- -- 5.9* 5.7*  ALBUMIN 2.2* -- 2.4* 2.4*2.4* --  AST 15 -- -- 19 20  ALT 6 -- -- 7 9  ALKPHOS 68 -- -- 66 66  BILITOT 0.7 -- -- 0.7 0.7  BILIDIR -- -- -- 0.3 0.3  IBILI -- -- -- 0.4 0.4  PREALBUMIN -- -- -- -- --  TRIG 72 -- -- -- --  CHOLHDL -- -- -- -- --  CHOL 111 -- -- -- --   Estimated Creatinine Clearance: 43.4 ml/min (by C-G formula based on Cr of 1.24).    Basename 05/19/12 0740 05/19/12 0412 05/19/12 0025  GLUCAP 187* 163* 176*    Medications:  Infusions:    . dextrose 5 % and 0.45% NaCl 1,000 mL with potassium  chloride 40 mEq infusion 125 mL/hr at 05/18/12 0945  . dextrose 5 % and 0.45% NaCl 1,000 mL with potassium chloride 40 mEq infusion 80 mL/hr at 05/18/12 2156  . diltiazem (CARDIZEM) infusion 15 mg/hr (05/19/12 0800)  . TPN (CLINIMIX) +/- additives 42 mL/hr at 05/18/12 1747  . DISCONTD: TPN (CLINIMIX) +/- additives      Insulin Requirements in the past 12 hours:  7 units  Current Nutrition:  Clinimix E5/15 at 42 ml/hr  Nutritional Goals:  1800 kCal, 90-100 grams of protein per day  Assessment: 78 year old woman who is s/p cholecyctectomy for cholelithiasis on 10/23. She had abdominal distension post-op and radiology studies revealed a possible SBO. An NG tube was placed which improved her symptoms. On 10/26, she underwent an ERCP and had a stent placed in her bile duct for a possible leak. Unfortunately, she still has evidence of SBO/ileus and TPN was initiated 10/27.  GI: S/P Lap Cholecystectomy 10/23 and ERCP with stent placement on 10/26. Has NG to suction in place for abdominal distention and SBO/ileus. On Protonix 40mg  IV q12h. Endo: History of DM2 - on  metformin and glipizide prior to admission. CBGs are more elevated with TPN initiation but close to goal of <180.  She is currently receiving SSI + 15 units of insulin in her TPN bag. Lytes: K 3.2, Mg 1.7 (on 10/26), Phos 1.6, Na 145.  Goal Magnesium is >2 with Afib.  5 runs of KCl ordered this AM, plus she has per liter of IVF.  With declining electrolytes there is concern for refeeding and this will limit our ability to advance her TPN rate. Renal: Had some acute renal failure post op due to hemodynamic renal injury. This is improving steadily with Scr down to 1.24 today.  Pulm: 95% on 3L  Cards: On diltiazem drip for Afib with RVR. Not an anticoagulation candidate currently due to hematoma and possible need for repeat surgery. Was not on warfarin prior to admission either.  Hepatobil: AST, ALT, TBili, Alk Phos WNL  Neuro: OK    ID: Cipro/Flagyl since 10/25. Afebrile. WBC 14.4. Urine culture from 10/25 is no growth. No other cultures. Best Practices: SCD for VTE prophylaxis  TPN Access: PICC placed 10/27 TPN day#: 2  Plan:  Continue Clinimix E5/15 at 42 ml/hr.  Will supplement lipids and MVI/trace elements on M-W-F only due to shortages. Give Magnesium 2gm IV x 1 Give KPhos IV x 1  Recheck BMET, Mag, and Phos with AM labs Increase insulin in TPN to 20 units - continue q4h CBG checks and SSI.  Estella Husk, Pharm.D., BCPS Clinical Pharmacist  Phone [phone] Pager [phone] 05/19/2012, 8:53 AM

## 2012-05-19 NOTE — Progress Notes (Addendum)
INITIAL ADULT NUTRITION ASSESSMENT Date: 05/19/2012   Time: 11:37 AM  Reason for Assessment: Consult for new TPN  INTERVENTION:  Monitor TPN advancement with Pharmacy.  Recommend advance diet as able to Regular.  DOCUMENTATION CODES Per approved criteria  -Not Applicable   ASSESSMENT: Female 78 y.o.  Dx: Respiratory distress  Hx:  Past Medical History  Diagnosis Date  . Colon cancer 07/1997  . Hypertension   . Diabetes mellitus   . CHF (congestive heart failure)   . AAA (abdominal aortic aneurysm)   . Atrial fibrillation   . Hypercholesteremia   . Kidney stone     renal calculi  . Small bowel obstruction due to adhesions 05/16/2012    Past Surgical History  Procedure Date  . Hemicolectomy 08/11/1997  . Kidney stone surgery   . Colon surgery     to remove colon ca  . Cholecystectomy 05/14/2012    Procedure: LAPAROSCOPIC CHOLECYSTECTOMY WITH INTRAOPERATIVE CHOLANGIOGRAM;  Surgeon: Currie Paris, MD;  Location: [facility] OR;  Service: General;  Laterality: N/A;    Related Meds:  Scheduled Meds:   . ciprofloxacin  400 mg Intravenous Q12H  . insulin aspart  0-9 Units Subcutaneous Q4H  . levothyroxine  26 mcg Intravenous QAC breakfast  . magnesium sulfate 1 - 4 g bolus IVPB  2 g Intravenous Once  . metronidazole  500 mg Intravenous Q8H  . pantoprazole (PROTONIX) IV  40 mg Intravenous Q12H  . potassium chloride  10 mEq Intravenous Q1 Hr x 6  . potassium chloride  10 mEq Intravenous Q1 Hr x 3  . potassium chloride  10 mEq Intravenous Q1 Hr x 5  . potassium chloride      . potassium phosphate IVPB (mmol)  15 mmol Intravenous Once  . sodium chloride  10-40 mL Intracatheter Q12H  . DISCONTD: ciprofloxacin  200 mg Intravenous Q24H  . DISCONTD: pantoprazole (PROTONIX) IV  40 mg Intravenous Q12H  . DISCONTD: potassium chloride  10 mEq Intravenous Q1 Hr x 3  . DISCONTD: potassium chloride  10 mEq Intravenous Q1 Hr x 5  . DISCONTD: sodium chloride  3 mL Intravenous Q12H    Continuous Infusions:   . dextrose 5 % and 0.45% NaCl 1,000 mL with potassium chloride 40 mEq infusion 125 mL/hr at 05/18/12 0945  . dextrose 5 % and 0.45% NaCl 1,000 mL with potassium chloride 40 mEq infusion 80 mL/hr at 05/19/12 0928  . diltiazem (CARDIZEM) infusion 15 mg/hr (05/19/12 0800)  . TPN (CLINIMIX) +/- additives     And  . fat emulsion    . TPN (CLINIMIX) +/- additives 42 mL/hr at 05/18/12 1747   PRN Meds:.diphenhydrAMINE, diphenhydrAMINE, hydrALAZINE, labetalol, morphine injection, ondansetron (ZOFRAN) IV, ondansetron, phenol, promethazine, sodium chloride   Ht: 5\' 10"  (177.8 cm)  Wt: 185 lb 3 oz (84 kg)  Ideal Wt: 68.2 kg % Ideal Wt: 123%  Wt Readings from Last 10 Encounters:  05/16/12 185 lb 3 oz (84 kg)  05/16/12 185 lb 3 oz (84 kg)  05/16/12 185 lb 3 oz (84 kg)  08/09/11 179 lb 12.8 oz (81.557 kg)  05/26/10 178 lb 11.2 oz (81.058 kg)   Usual Wt: 179 lb % Usual Wt: 103%  Body mass index is 26.57 kg/(m^2).  Food/Nutrition Related Hx: No nutrition problems identified on admission nutrition screen.  Labs:  CMP     Component Value Date/Time   NA 145 05/19/2012 0415   K 3.2* 05/19/2012 0415   CL 105 05/19/2012 0415  CO2 35* 05/19/2012 0415   GLUCOSE 156* 05/19/2012 0415   BUN 22 05/19/2012 0415   CREATININE 1.24* 05/19/2012 0415   CALCIUM 8.5 05/19/2012 0415   PROT 5.8* 05/19/2012 0415   ALBUMIN 2.2* 05/19/2012 0415   AST 15 05/19/2012 0415   ALT 6 05/19/2012 0415   ALKPHOS 68 05/19/2012 0415   BILITOT 0.7 05/19/2012 0415   GFRNONAA 40* 05/19/2012 0415   GFRAA 47* 05/19/2012 0415    CBG (last 3)   Basename 05/19/12 0740 05/19/12 0412 05/19/12 0025  GLUCAP 187* 163* 176*    Sodium  Date/Time Value Range Status  05/19/2012  4:15 AM 145  135 - 145 mEq/L Final  05/18/2012  4:04 PM 149* 135 - 145 mEq/L Final  05/18/2012  8:34 AM 151* 135 - 145 mEq/L Final    Potassium  Date/Time Value Range Status  05/19/2012  4:15 AM 3.2* 3.5 - 5.1  mEq/L Final  05/18/2012  4:04 PM 3.0* 3.5 - 5.1 mEq/L Final  05/18/2012  8:34 AM 2.8* 3.5 - 5.1 mEq/L Final    Phosphorus  Date/Time Value Range Status  05/19/2012  4:15 AM 1.6* 2.3 - 4.6 mg/dL Final  16/04/9603  5:40 AM 2.6  2.3 - 4.6 mg/dL Final  98/05/9146  8:29 AM 2.8  2.3 - 4.6 mg/dL Final    Magnesium  Date/Time Value Range Status  05/19/2012  4:15 AM 1.7  1.5 - 2.5 mg/dL Final  56/21/3086  5:78 AM 1.7  1.5 - 2.5 mg/dL Final  46/96/2952  8:41 AM 1.9  1.5 - 2.5 mg/dL Final     Intake/Output Summary (Last 24 hours) at 05/19/12 1140 Last data filed at 05/19/12 0800  Gross per 24 hour  Intake   3103 ml  Output   3175 ml  Net    -72 ml    Diet Order: NPO  Patient is receiving TPN with Clinimix E 5/15 @ 42 ml/hr.  Lipids (20% IVFE @ 10 ml/hr), multivitamins, and trace elements are provided 3 times weekly (MWF) due to national backorder.  Provides 921 kcal and 50 grams protein daily (based on weekly average).  Meets 54% minimum estimated kcal and 53% minimum estimated protein needs.  IVF:    dextrose 5 % and 0.45% NaCl 1,000 mL with potassium chloride 40 mEq infusion Last Rate: 125 mL/hr at 05/18/12 0945  dextrose 5 % and 0.45% NaCl 1,000 mL with potassium chloride 40 mEq infusion Last Rate: 80 mL/hr at 05/19/12 0928  diltiazem (CARDIZEM) infusion Last Rate: 15 mg/hr (05/19/12 0800)  TPN (CLINIMIX) +/- additives   And   fat emulsion   TPN (CLINIMIX) +/- additives Last Rate: 42 mL/hr at 05/18/12 1747    Estimated Nutritional Needs:   Kcal: 1700-1900 Protein: 95-110 gm Fluid: 1.8-2 liters  Patient with chronic cholecystitis, S/P stent placement to CBD 10/26.  Currently unable to take PO's.  Patient is passing flatus today.  TPN via PICC was initiated yesterday.  Suspect patient was well-nourished PTA.  Weight was stable with good PO intake.  NUTRITION DIAGNOSIS: Inadequate oral intake related to altered GI function as evidenced by NPO  status.  MONITORING/EVALUATION(Goals): Goal:  Intake to meet >90% of estimated nutrition needs. Monitor:  TPN tolerance/adequacy, weight trend, labs, I/O, ability to begin PO diet.  EDUCATION NEEDS: -Education not appropriate at this time  Joaquin Courts, RD, LDN, CNSC Pager# [phone] After Hours Pager# [phone]  05/19/2012, 11:37 AM

## 2012-05-19 NOTE — Progress Notes (Signed)
Suggested to nurse she may want to get order for chest x-ray to verify placement of PICC line. Colbert Ewing RN VAST.

## 2012-05-19 NOTE — Progress Notes (Signed)
Patient ID: Nicole [name], female   DOB: 1931/07/06, 78 y.o.   MRN: [id number] 2 Days Post-Op  Subjective: Resting quietly this morning, Only c/o is of "sore throat" from NG tube . Appears comfortable at present. Sitting up in chair at bedside.   Objective: Vital signs in last 24 hours: Temp:  [98.1 F (36.7 C)-99.1 F (37.3 C)] 99.1 F (37.3 C) (10/28 0729) Pulse Rate:  [82-109] 82  (10/28 0729) Resp:  [20-28] 20  (10/28 0729) BP: (117-149)/(64-79) 117/64 mmHg (10/28 0729) SpO2:  [94 %-98 %] 98 % (10/28 0729) Last BM Date: 05/10/12  Intake/Output from previous day: 10/27 0701 - 10/28 0700 In: 3366 [I.V.:2360; IV Piggyback:460; TPN:546] Out: 4575 [Urine:2475; Emesis/NG output:2100] Intake/Output this shift: Total I/O In: 237 [I.V.:95; IV Piggyback:100; TPN:42] Out: 250 [Urine:250]  General appearance: alert, cooperative, appears stated age, fatigued and no distress Chest: CTA bilaterally Cardiac: A-fib rate HR 90's on cardiazem drip Abdomen: Tympanic, distended, DSG C/D/I no drainage, NG remains in place and is functioning well ( of bilious output) No BS.  Abdominal films done today: Findings: There are dilated loops of small bowel in the central  abdomen, measuring up to 4.7 cm in diameter. Nasogastric  terminates in the stomach. Minimal colonic gas. There is gas in  the rectum.  IMPRESSION:  Bowel gas pattern appears relatively unchanged from 05/18/2012,  favoring a small bowel obstruction.  Original Report Authenticated By: Reyes Ivan, M.D. Extremities: warm, no edema, + pulses bilaterally no tenderness. Hypokalemic (3.2) receiving repletion. Hypernatria resolved (145)  WBC trending up slightly  H&H, PLT's stable. BUN and Creat improving.   Lab Results:   Basename 05/19/12 0415 05/18/12 0455  WBC 14.4* 12.4*  HGB 8.7* 9.0*  HCT 24.8* 26.4*  PLT 285 299   BMET  Basename 05/19/12 0415 05/18/12 1604  NA 145 149*  K 3.2* 3.0*  CL 105 103  CO2  35* 40*  GLUCOSE 156* 144*  BUN 22 25*  CREATININE 1.24* 1.54*  CALCIUM 8.5 8.4   PT/INR No results found for this basename: LABPROT:2,INR:2 in the last 72 hours ABG No results found for this basename: PHART:2,PCO2:2,PO2:2,HCO3:2 in the last 72 hours  Studies/Results: Dg Chest Port 1 View  05/18/2012  *RADIOLOGY REPORT*  Clinical Data: Central line placement  PORTABLE CHEST - 1 VIEW  Comparison: 05/16/2012  Findings: Right arm PICC line extends to the low SVC.  Heart size upper limits normal for technique.  Low volumes with perihilar and bibasilar subsegmental atelectasis or infiltrate, left greater than right.  No effusion.  Nasogastric tube extends at least as far as the stomach, tip not seen.  IMPRESSION:  1.  PICC line to low SVC.   Original Report Authenticated By: Thora Lance III, M.D.    Dg Ercp With Sphincterotomy  05/17/2012  *RADIOLOGY REPORT*  Clinical Data: Common bile duct leak and  ERCP with sphincterotomy  Comparison:  CT 05/16/2012, and hiatus scan 05/16/2012  Technique:  Multiple spot images obtained with the fluoroscopic device and submitted for interpretation post-procedure.  ERCP was performed by Dr. an.  Findings: Two fluoroscopic images are provided.  There is an endoscope within duodenum.  The common bile duct is cannulated following a small sphincterotomy.  Injection of contrast demonstrates no filling defect.  No evidence of leak from the cystic duct.  A stent was placed by report  IMPRESSION: No evidence of biliary leak.  ERCP with sphincterotomy  and stent placement.  These images were submitted for  radiologic interpretation only. Please see the procedural report for the amount of contrast and the fluoroscopy time utilized.   Original Report Authenticated By: Genevive Bi, M.D.    Dg Abd 2 Views  05/18/2012  *RADIOLOGY REPORT*  Clinical Data: Small bowel obstruction, abdominal pain  ABDOMEN - 2 VIEW  Comparison: CT 05/16/2012  Findings: Nasogastric tube  extends into the decompressed stomach. Vascular clips in the right upper abdomen.  No free air on the left lateral decubitus radiograph.  There are few gas dilated small bowel loops in mid abdomen, with fluid levels on the decubitus film.  Normal distribution of gas and stool throughout the nondilated colon.  Plastic endoscopic biliary stent projects in expected location.  Regional bones unremarkable.  IMPRESSION:  1.  Fluid levels in dilated small bowel loops suggesting persistent mid small bowel obstruction. 2.  No free air. 3. Support hardware stable in position.   Original Report Authenticated By: Osa Craver, M.D.    Dg Abd Portable 2v  05/19/2012  *RADIOLOGY REPORT*  Clinical Data: Ileus versus small bowel obstruction.  PORTABLE ABDOMEN - 2 VIEW  Comparison: 05/18/2012.  Findings: There are dilated loops of small bowel in the central abdomen, measuring up to 4.7 cm in diameter.  Nasogastric terminates in the stomach.  Minimal colonic gas.  There is gas in the rectum.  IMPRESSION: Bowel gas pattern appears relatively unchanged from 05/18/2012, favoring a small bowel obstruction.   Original Report Authenticated By: Reyes Ivan, M.D.     Anti-infectives: Anti-infectives     Start     Dose/Rate Route Frequency Ordered Stop   05/18/12 2200   ciprofloxacin (CIPRO) IVPB 400 mg        400 mg 200 mL/hr over 60 Minutes Intravenous Every 12 hours 05/18/12 1457     05/16/12 1000   ciprofloxacin (CIPRO) IVPB 200 mg  Status:  Discontinued        200 mg 100 mL/hr over 60 Minutes Intravenous Every 24 hours 05/16/12 0846 05/18/12 1457   05/16/12 0800   metroNIDAZOLE (FLAGYL) IVPB 500 mg        500 mg 100 mL/hr over 60 Minutes Intravenous Every 8 hours 05/16/12 0735     05/14/12 0600   ceFAZolin (ANCEF) IVPB 2 g/50 mL premix        2 g 100 mL/hr over 30 Minutes Intravenous On call to O.R. 05/13/12 1424 05/14/12 1407          Assessment/Plan:  Patient Active Problem List    Diagnosis  . Anemia  . History of colon cancer, stage III  . Abdominal pain, acute, right upper quadrant  . Nausea and vomiting  . Dehydration  . Cholecystitis chronic, acute, S/P lap cholecystectomy 05/14/13  . Thoracic aortic aneurysm, 4.8cm 2011  . CHF, past NICM with an EF of 30%, most recent echo 2011 showed EF to be45- 50%  . Chronic atrial fibrillation, not felt to be a Coumadin candidate in the past  . Acute on chronic renal insufficiency  . Normal coronary arteries, 2008  . Diabetes mellitus  . HTN (hypertension)  . Respiratory distress  . ARF (acute renal failure)  . Atrial fibrillation with RVR  . Postoperative ileus  . Small bowel obstruction due to adhesions  . Bile leak, postoperative  . Hypokalemia  Hypernatremia s/p Procedure(s) (LRB) with comments: ENDOSCOPIC RETROGRADE CHOLANGIOPANCREATOGRAPHY (ERCP) (N/A)  Plan: 1. Keep NPO 2. Replete K+ recheck BMP post repletion today. 3. Management of her renal and  cardiac issues per nephrology and cardiology respectively 4. Continue to follow clinical presentation  .  LOS: 7 days    Golda Acre [facility] Pager # [phone] 05/19/2012

## 2012-05-20 LAB — BASIC METABOLIC PANEL
BUN: 18 mg/dL (ref 6–23)
CO2: 29 mEq/L (ref 19–32)
Chloride: 106 mEq/L (ref 96–112)
Chloride: 106 mEq/L (ref 96–112)
Creatinine, Ser: 1.04 mg/dL (ref 0.50–1.10)
GFR calc Af Amer: 63 mL/min — ABNORMAL LOW (ref >90)
GFR calc Af Amer: 58 mL/min — ABNORMAL LOW (ref >90)
GFR calc non Af Amer: 54 mL/min — ABNORMAL LOW (ref >90)
Potassium: 5.1 mEq/L (ref 3.5–5.1)
Potassium: 4 mEq/L (ref 3.5–5.1)

## 2012-05-20 LAB — CBC
HCT: 26.7 % — ABNORMAL LOW (ref 36.0–46.0)
Hemoglobin: 9.2 g/dL — ABNORMAL LOW (ref 12.0–15.0)
MCH: 28.4 pg (ref 26.0–34.0)
MCHC: 34.5 g/dL (ref 30.0–36.0)
MCV: 82.4 fL (ref 78.0–100.0)
Platelets: 335 10*3/uL (ref 150–400)
RBC: 3.24 MIL/uL — ABNORMAL LOW (ref 3.87–5.11)
RDW: 15.4 % (ref 11.5–15.5)
WBC: 21.1 10*3/uL — ABNORMAL HIGH (ref 4.0–10.5)

## 2012-05-20 LAB — GLUCOSE, CAPILLARY
Glucose-Capillary: 174 mg/dL — ABNORMAL HIGH (ref 70–99)
Glucose-Capillary: 200 mg/dL — ABNORMAL HIGH (ref 70–99)
Glucose-Capillary: 82 mg/dL (ref 70–99)

## 2012-05-20 LAB — MAGNESIUM: Magnesium: 1.6 mg/dL (ref 1.5–2.5)

## 2012-05-20 MED ORDER — MAGNESIUM SULFATE 40 MG/ML IJ SOLN
2.0000 g | Freq: Once | INTRAMUSCULAR | Status: AC
  Administered 2012-05-20: 2 g via INTRAVENOUS
  Filled 2012-05-20: qty 50

## 2012-05-20 MED ORDER — BISACODYL 10 MG RE SUPP
10.0000 mg | Freq: Once | RECTAL | Status: AC
  Administered 2012-05-20: 10 mg via RECTAL
  Filled 2012-05-20: qty 1

## 2012-05-20 MED ORDER — INSULIN REGULAR HUMAN 100 UNIT/ML IJ SOLN
INTRAVENOUS | Status: AC
  Administered 2012-05-20: 18:00:00 via INTRAVENOUS
  Filled 2012-05-20: qty 2000

## 2012-05-20 MED ORDER — BISACODYL 10 MG RE SUPP: 10.0000 mg | Freq: Every day | RECTAL | Status: DC | PRN

## 2012-05-20 MED ORDER — INSULIN ASPART 100 UNIT/ML ~~LOC~~ SOLN: 0.0000 [IU] | SUBCUTANEOUS | Status: DC

## 2012-05-20 MED ORDER — INSULIN ASPART 100 UNIT/ML ~~LOC~~ SOLN
0.0000 [IU] | SUBCUTANEOUS | Status: DC
  Administered 2012-05-20 (×2): 5 [IU] via SUBCUTANEOUS
  Administered 2012-05-21 (×3): 3 [IU] via SUBCUTANEOUS
  Administered 2012-05-21 (×2): 5 [IU] via SUBCUTANEOUS
  Administered 2012-05-22: 3 [IU] via SUBCUTANEOUS
  Administered 2012-05-22: 01:00:00 via SUBCUTANEOUS
  Administered 2012-05-22: 3 [IU] via SUBCUTANEOUS

## 2012-05-20 MED ORDER — SODIUM GLYCEROPHOSPHATE 1 MMOLE/ML IV SOLN
20.0000 mmol | Freq: Once | INTRAVENOUS | Status: AC
  Administered 2012-05-20: 20 mmol via INTRAVENOUS
  Filled 2012-05-20: qty 20

## 2012-05-20 NOTE — Progress Notes (Signed)
PATIENT DETAILS Name: Nicole [name] Age: 78 y.o. Sex: female Date of Birth: 03/15/1931 Admit Date: 05/12/2012 Admitting Physician Currie Paris, MD ZOX:WRUE, [facility], DO  Brief Narrative: 78 year old female who was admitted on 05/12/2012 with 2-3 days of persistent right upper quadrant abdominal pain worse after eating. She had been evaluated in the emergency department 24 hours before admission and ultrasound revealed symptomatic cholelithiasis. She had been evaluated by surgery that time and plans were to proceed with elective cholecystectomy after patient stopped her Plavix and aspirin for one week. Unfortunately she returned to the ER because of uncontrolled pain with vomiting. sHe subsequently underwent a laparoscopic cholecystectomy on 05/14/2012. Postoperatively She developed severe abdominal pain associated with nausea and vomiting. HIDA scan revealed bile leak. In the interim she developed atrial fibrillation with RVR and was subsequentLY started on Cardizem drip. CT scan performed demonstrated small bowel obstruction that warranted NG tube placement and surgical consultation. She has subsequently undergone ERCP with stent placement on 05/17/2012.  Assessment/Plan:  Abdominal Distention 2/2 post operative SBO/ileus - Appreciate surgical team assistance -c/w NPO status, NG tube, and supportive care  -Bile Leak -following Lap Cholecystectomy 10/23 -S/P ERCP with Stent placement 10/26 -empiric Cipro and Flagyl- WBC counts rising- discussed with surgery- no plans to take back to OR as of yet.   Abdominal Wall hematoma -H/H decreased -suspect from IVF rather than bleeding -monitor H&H  daily -may need transfusion if this trend continues  ARF -appreciate Renal eval -suspicion for hemodynamic mediated renal injury-as patient was very briefly hypotensive on 10/24-retrospectively-hypotension probably was a result from developing abd wall hematoma/?intra-abdominal  pathology -creatinine now starting to downtrend  Afib RVR -c/w cardizem gtt -not a candidate for anticoagulation-given hematoma/potential for repeart surgery, more importantly has been deemed not a be candidate for anticoagulation by her cardiologist in the past as well-?non compliance -cards following  Resp Failure -hypoxic-stable with just 2-3 L of O2 -suspect 2/2 to abdominal pain/distention/atelectasis/mild pul congestion -monitor for now  Cholecystitis -s/p Lap cholecystectomy on 10/23 -unfortunately complicated by abdominal wall hematoma and bile leak-see above  Hypokalemia -2/2 to GI loss via NG suction -replete and recheck - goal is K+ of 4.0 or >  Hypernatremia -will c/w 1/2 NS for now, recheck lytes-if Na still increasing then will change to D5W  DM -holding all oral agents -CBG's elevated due to TNA- will start Q4 sliding scale insulin  CHF (systolic EF 45% and diastolic dysfunction-grade 1) -compensated -cards following -moderate TR due to pulmonary HTN  HTN -given hypotension on 10/24- and resultant ARF-holding all antihypertensives -monitor and restart IV meds as needed  Thoracic Aortic Aneurysm -outpatient follow up  H/O Stage III colon Ca -outpatient follow up with Oncology  Disposition: Remain inpatient DVT Prophylaxis: scd's Code Status: Full code   Procedures:  Laparoscopic Cholecystectomy 10/23   ERCP 10/26  CONSULTS: cardiology  Nephrology GI  general surgery  Subjective: Pt c/o of trouble taking deep breath due to distension of abdomen. No abdominal pain.   PHYSICAL EXAM: Vital signs in last 24 hours: Filed Vitals:   05/20/12 0800 05/20/12 0940 05/20/12 1200 05/20/12 1600  BP:  125/87 152/81 142/73  Pulse: 78 83 87 83  Temp: 99.3 F (37.4 C)  98.9 F (37.2 C) 98.8 F (37.1 C)  TempSrc: Oral  Oral Oral  Resp: 21 32 29 21  Height:      Weight:      SpO2: 97% 97% 94% 96%    Weight change:  Body  mass index is 26.57  kg/(m^2).   Gen Exam: Awake and alert with clear speech.   Neck: Supple, No JVD.   Chest: B/L Clear.   CVS: S1 S2 Regular, tachycardia.  Abdomen: distended, non-tender, BS decreased.  Extremities: no edema, lower extremities warm to touch. Neurologic: Non Focal.   Skin: No Rash.   Wounds: N/A.    Intake/Output from previous day:  Intake/Output Summary (Last 24 hours) at 05/20/12 1758 Last data filed at 05/20/12 1700  Gross per 24 hour  Intake   4138 ml  Output   2375 ml  Net   1763 ml    LAB RESULTS: CBC  Lab 05/20/12 0850 05/19/12 0415 05/18/12 0455 05/17/12 0555 05/16/12 0700 05/16/12 0143  WBC 21.1* 14.4* 12.4* 13.7* 13.9* --  HGB 9.2* 8.7* 9.0* 9.9* 12.3 --  HCT 26.7* 24.8* 26.4* 27.4* 34.7* --  PLT 335 285 299 266 281 --  MCV 82.4 82.9 82.0 82.0 82.6 --  MCH 28.4 29.1 28.0 29.6 29.3 --  MCHC 34.5 35.1 34.1 36.1* 35.4 --  RDW 15.4 15.3 15.3 15.4 15.4 --  LYMPHSABS -- 0.8 -- -- -- 0.7  MONOABS -- 2.6* -- -- -- 1.8*  EOSABS -- 0.1 -- -- -- 0.0  BASOSABS -- 0.0 -- -- -- 0.0  BANDABS -- -- -- -- -- --    Chemistries   Lab 05/20/12 0850 05/20/12 0430 05/19/12 1704 05/19/12 0415 05/18/12 1604 05/17/12 0555 05/14/12 0550  NA 142 141 145 145 149* -- --  K 4.0 5.1 3.7 3.2* 3.0* -- --  CL 106 106 106 105 103 -- --  CO2 29 30 32 35* 40* -- --  GLUCOSE 216* 365* 200* 156* 144* -- --  BUN 18 16 21 22  25* -- --  CREATININE 1.04 0.97 1.17* 1.24* 1.54* -- --  CALCIUM 8.5 8.3* 8.4 8.5 8.4 -- --  MG 1.8 1.6 -- 1.7 -- 1.7 1.9    CBG:  Lab 05/20/12 1110 05/20/12 0750 05/20/12 0413 05/20/12 0057 05/20/12 0017  GLUCAP 234* 200* 174* 82 194*    GFR Estimated Creatinine Clearance: 51.7 ml/min (by C-G formula based on Cr of 1.04).  Coagulation profile No results found for this basename: INR:5,PROTIME:5 in the last 168 hours  Basename 05/19/12 0415  CHOL 111  HDL --  LDLCALC --  TRIG 72  CHOLHDL --  LDLDIRECT --   MICROBIOLOGY: Recent Results (from the past 240  hour(s))  SURGICAL PCR SCREEN     Status: Abnormal   Collection Time   05/14/12  4:10 AM      Component Value Range Status Comment   MRSA, PCR NEGATIVE  NEGATIVE Final    Staphylococcus aureus POSITIVE (*) NEGATIVE Final   URINE CULTURE     Status: Normal   Collection Time   05/16/12  1:50 AM      Component Value Range Status Comment   Specimen Description URINE, CATHETERIZED   Final    Special Requests NONE   Final    Culture  Setup Time 05/16/2012 02:27   Final    Colony Count NO GROWTH   Final    Culture NO GROWTH   Final    Report Status 05/16/2012 FINAL   Final    MEDICATIONS: Reviewed by M.D.  Gweneth Fritter  [facility] Pager:[phone]  If 7PM-7AM, please contact night-coverage [email or web address] Password [email or web address] 05/20/2012, 5:58 PM   LOS: 8 days

## 2012-05-20 NOTE — Progress Notes (Signed)
Patient ID: Nicole [name], female   DOB: 12/31/1930, 78 y.o.   MRN: [id number] 3 Days Post-Op  Subjective: Pt wo complaints, no changes overnight, denies flatus or bm, no nausea or vomiting.  Objective: Vital signs in last 24 hours: Temp:  [98.3 F (36.8 C)-99.3 F (37.4 C)] 99.3 F (37.4 C) (10/29 0700) Pulse Rate:  [77-85] 80  (10/29 0410) Resp:  [21-29] 22  (10/29 0410) BP: (124-158)/(65-89) 147/81 mmHg (10/29 0410) SpO2:  [94 %-98 %] 96 % (10/29 0500) Last BM Date: 05/10/12  Intake/Output from previous day: 10/28 0701 - 10/29 0700 In: 3643 [I.V.:1795; IV Piggyback:710; TPN:1138] Out: 2675 [Urine:1525; Emesis/NG output:1150] Intake/Output this shift:    General appearance: alert, cooperative and no distress GI: mildly distended, no bowel sounds, mildly tender    Lab Results:   Basename 05/19/12 0415 05/18/12 0455  WBC 14.4* 12.4*  HGB 8.7* 9.0*  HCT 24.8* 26.4*  PLT 285 299   BMET  Basename 05/20/12 0430 05/19/12 1704  NA 141 145  K 5.1 3.7  CL 106 106  CO2 30 32  GLUCOSE 365* 200*  BUN 16 21  CREATININE 0.97 1.17*  CALCIUM 8.3* 8.4   PT/INR No results found for this basename: LABPROT:2,INR:2 in the last 72 hours ABG No results found for this basename: PHART:2,PCO2:2,PO2:2,HCO3:2 in the last 72 hours  Studies/Results: Dg Chest Port 1 View  05/19/2012  *RADIOLOGY REPORT*  Clinical Data: PICC line placement.  PORTABLE CHEST - 1 VIEW  Comparison: One-view chest 05/18/2012.  Findings: Mild cardiac enlargement is stable.  The sideport of an NG tube is in the stomach.  A new right-sided PICC line is in place.  The tip is in the distal SVC, above the cavoatrial junction.  The lung volumes remain low.  Mild pulmonary vascular congestion is stable.  IMPRESSION:  1.  Stable position of the right-sided PICC line in the lower SVC. The 2.  Persistent low lung volumes. 3.  Mild cardiomegaly and pulmonary vascular congestion.   Original Report Authenticated By:  Jamesetta Orleans. MATTERN, M.D.    Dg Chest Port 1 View  05/18/2012  *RADIOLOGY REPORT*  Clinical Data: Central line placement  PORTABLE CHEST - 1 VIEW  Comparison: 05/16/2012  Findings: Right arm PICC line extends to the low SVC.  Heart size upper limits normal for technique.  Low volumes with perihilar and bibasilar subsegmental atelectasis or infiltrate, left greater than right.  No effusion.  Nasogastric tube extends at least as far as the stomach, tip not seen.  IMPRESSION:  1.  PICC line to low SVC.   Original Report Authenticated By: Osa Craver, M.D.    Dg Abd Portable 2v  05/19/2012  *RADIOLOGY REPORT*  Clinical Data: Ileus versus small bowel obstruction.  PORTABLE ABDOMEN - 2 VIEW  Comparison: 05/18/2012.  Findings: There are dilated loops of small bowel in the central abdomen, measuring up to 4.7 cm in diameter.  Nasogastric terminates in the stomach.  Minimal colonic gas.  There is gas in the rectum.  IMPRESSION: Bowel gas pattern appears relatively unchanged from 05/18/2012, favoring a small bowel obstruction.   Original Report Authenticated By: Reyes Ivan, M.D.     Anti-infectives: Anti-infectives     Start     Dose/Rate Route Frequency Ordered Stop   05/18/12 2200   ciprofloxacin (CIPRO) IVPB 400 mg        400 mg 200 mL/hr over 60 Minutes Intravenous Every 12 hours 05/18/12 1457  05/16/12 1000   ciprofloxacin (CIPRO) IVPB 200 mg  Status:  Discontinued        200 mg 100 mL/hr over 60 Minutes Intravenous Every 24 hours 05/16/12 0846 05/18/12 1457   05/16/12 0800   metroNIDAZOLE (FLAGYL) IVPB 500 mg        500 mg 100 mL/hr over 60 Minutes Intravenous Every 8 hours 05/16/12 0735     05/14/12 0600   ceFAZolin (ANCEF) IVPB 2 g/50 mL premix        2 g 100 mL/hr over 30 Minutes Intravenous On call to O.R. 05/13/12 1424 05/14/12 1407          Assessment/Plan:  Patient Active Problem List  Diagnosis  . Anemia  . History of colon cancer, stage III  .  Abdominal pain, acute, right upper quadrant  . Nausea and vomiting  . Dehydration  . Cholecystitis chronic, acute, S/P lap cholecystectomy 05/14/13  . Thoracic aortic aneurysm, 4.8cm 2011  . CHF, past NICM with an EF of 30%, most recent echo 2011 showed EF to be45- 50%  . Chronic atrial fibrillation, not felt to be a Coumadin candidate in the past  . Acute on chronic renal insufficiency  . Normal coronary arteries, 2008  . Diabetes mellitus  . HTN (hypertension)  . Respiratory distress  . ARF (acute renal failure)  . Atrial fibrillation with RVR  . Postoperative ileus  . Small bowel obstruction due to adhesions  . Bile leak, postoperative  . Hypokalemia   1. POD#6-lap chole: bile leak post-op, s/p ERCP with stenting of the CBD, now with post-operative ileus.  Films today suggesting SBO pattern but feel this is ileus.    --keep npo  --leave NGT to suction until having some bowel function  --TNA  --recheck cbc today  --repeat films on Thursday  --Hypokalemia improved        LOS: 8 days    [name], [facility] 05/20/2012

## 2012-05-20 NOTE — Progress Notes (Signed)
Physical Therapy Treatment Patient Details Name: Nicole [name] MRN: [id number] DOB: [birth year] Today's Date: 05/20/2012 Time: 4098-1191 PT Time Calculation (min): 48 min  PT Assessment / Plan / Recommendation Comments on Treatment Session  Pt admitted s/p laparoscopic cholecystectomy with biliary leak/stent placed. Pt motivated and progressing with PT with increased tolerance to ambulation distance today.    Follow Up Recommendations  Home health PT;Supervision - Intermittent     Does the patient have the potential to tolerate intense rehabilitation     Barriers to Discharge        Equipment Recommendations  Rolling walker with 5" wheels    Recommendations for Other Services    Frequency Min 3X/week   Plan Discharge plan remains appropriate;Frequency remains appropriate    Precautions / Restrictions Precautions Precautions: None Restrictions Weight Bearing Restrictions: No   Pertinent Vitals/Pain None    Mobility  Bed Mobility Bed Mobility: Supine to Sit Supine to Sit: 4: Min assist;HOB elevated (HOB 30 degrees.) Details for Bed Mobility Assistance: Assist for trunk to translate anterior with cues for sequence and safety. Transfers Transfers: Sit to Stand;Stand to Sit (2 trials.) Sit to Stand: 4: Min assist;With upper extremity assist;From bed;From toilet Stand to Sit: 4: Min assist;With upper extremity assist;To toilet;To chair/3-in-1 Details for Transfer Assistance: Assist for balance with cues for safest hand placement. Ambulation/Gait Ambulation/Gait Assistance: 4: Min assist Ambulation Distance (Feet): 50 Feet (40 and 10 feet.) Assistive device: Rolling walker Ambulation/Gait Assistance Details: Assist for balance with cues for tall posture and safety inside RW.  Gait Pattern: Step-through pattern;Decreased stride length;Trunk flexed Gait velocity: decreased Stairs: No Wheelchair Mobility Wheelchair Mobility: No    Exercises     PT Diagnosis:    PT  Problem List:   PT Treatment Interventions:     PT Goals Acute Rehab PT Goals PT Goal Formulation: With patient Time For Goal Achievement: 05/22/12 Potential to Achieve Goals: Good PT Goal: Supine/Side to Sit - Progress: Progressing toward goal PT Goal: Sit to Stand - Progress: Progressing toward goal PT Goal: Stand to Sit - Progress: Progressing toward goal PT Goal: Ambulate - Progress: Progressing toward goal  Visit Information  Last PT Received On: 05/20/12 Assistance Needed: +1    Subjective Data  Subjective: "I would love to try a little bit." Patient Stated Goal: Get well.   Cognition  Overall Cognitive Status: Appears within functional limits for tasks assessed/performed Arousal/Alertness: Awake/alert Orientation Level: Appears intact for tasks assessed Behavior During Session: [facility] for tasks performed    Balance  Balance Balance Assessed: No  End of Session PT - End of Session Equipment Utilized During Treatment: Gait belt;Oxygen Activity Tolerance: Patient tolerated treatment well Patient left: with call bell/phone within reach;with family/visitor present;in chair Nurse Communication: Mobility status   GP     Cephus Shelling 05/20/2012, 2:09 PM  05/20/2012 Cephus Shelling, PT, DPT [phone]

## 2012-05-20 NOTE — Progress Notes (Signed)
[facility] 3 Days post op, stent placed in CBD  6 days post op Lap chole   Subjective: Doing well.  No dizziness or SOB.  Objective: Vital signs in last 24 hours: Temp:  [98.3 F (36.8 C)-99.3 F (37.4 C)] 99.3 F (37.4 C) (10/29 0800) Pulse Rate:  [77-85] 78  (10/29 0800) Resp:  [21-29] 21  (10/29 0800) BP: (124-158)/(65-89) 147/81 mmHg (10/29 0410) SpO2:  [94 %-98 %] 97 % (10/29 0800) Last BM Date: 05/10/12  Intake/Output from previous day: 10/28 0701 - 10/29 0700 In: 3643 [I.V.:1795; IV Piggyback:710; TPN:1138] Out: 2675 [Urine:1525; Emesis/NG output:1150] Intake/Output this shift: Total I/O In: 234 [I.V.:130; TPN:104] Out: 50 [Emesis/NG output:50]  Medications Current Facility-Administered Medications  Medication Dose Route Frequency Provider Last Rate Last Dose  . alteplase (ACTIVASE) injection 2 mg  2 mg Intracatheter Once Lonia Blood, MD   2 mg at 05/19/12 1823  . ciprofloxacin (CIPRO) IVPB 400 mg  400 mg Intravenous Q12H Maretta Bees, MD   400 mg at 05/19/12 2223  . dextrose 5 % and 0.45% NaCl 1,000 mL with potassium chloride 40 mEq infusion   Intravenous Continuous Trevor Iha, MD 50 mL/hr at 05/20/12 0900    . diltiazem (CARDIZEM) 100 mg in dextrose 5 % 100 mL infusion  5-15 mg/hr Intravenous Titrated Maretta Bees, MD 15 mL/hr at 05/20/12 0900 15 mg/hr at 05/20/12 0900  . diphenhydrAMINE (BENADRYL) injection 25 mg  25 mg Intravenous Q6H PRN Tarry Kos, MD   25 mg at 05/14/12 0016   Or  . diphenhydrAMINE (BENADRYL) capsule 25 mg  25 mg Oral Q6H PRN Tarry Kos, MD      . TPN [facility]) +/- additives   Intravenous Continuous TPN Madolyn Frieze, PHARMD 42 mL/hr at 05/20/12 0900     And  . fat emulsion 20 % infusion 250 mL  250 mL Intravenous Continuous TPN Madolyn Frieze, PHARMD 10 mL/hr at 05/20/12 0900 250 mL at 05/20/12 0900  . hydrALAZINE (APRESOLINE) injection 10 mg  10 mg Intravenous  Q6H PRN Nishant Dhungel, MD   10 mg at 05/13/12 0740  . insulin aspart (novoLOG) injection 0-9 Units  0-9 Units Subcutaneous Q4H Stephani Police, PA   2 Units at 05/20/12 0802  . labetalol (NORMODYNE,TRANDATE) injection 5 mg  5 mg Intravenous Q10 min PRN Zenon Mayo, MD   5 mg at 05/14/12 1534  . levothyroxine (SYNTHROID, LEVOTHROID) injection 26 mcg  26 mcg Intravenous QAC breakfast Maretta Bees, MD   26 mcg at 05/20/12 0802  . magnesium sulfate IVPB 2 g 50 mL  2 g Intravenous Once Madolyn Frieze, PHARMD   2 g at 05/19/12 1020  . metroNIDAZOLE (FLAGYL) IVPB 500 mg  500 mg Intravenous Q8H Maretta Bees, MD   500 mg at 05/20/12 0802  . morphine 2 MG/ML injection 1 mg  1 mg Intravenous Q4H PRN Tarry Kos, MD   1 mg at 05/16/12 0459  . ondansetron (ZOFRAN) tablet 4 mg  4 mg Oral Q6H PRN Currie Paris, MD       Or  . ondansetron [facility]) injection 4 mg  4 mg Intravenous Q6H PRN Currie Paris, MD   4 mg at 05/16/12 0055  . pantoprazole (PROTONIX) injection 40 mg  40 mg Intravenous Q12H Blenda Mounts, NP   40 mg at 05/19/12 2223  . phenol (CHLORASEPTIC) mouth spray 1 spray  1 spray Mouth/Throat PRN Shanker  Levora Dredge, MD      . potassium chloride 10 mEq in 50 mL *CENTRAL LINE* IVPB  10 mEq Intravenous Q1 Hr x 5 Blenda Mounts, NP   10 mEq at 05/19/12 1329  . potassium phosphate 15 mmol in dextrose 5 % 250 mL infusion  15 mmol Intravenous Once Madolyn Frieze, PHARMD   15 mmol at 05/19/12 1020  . promethazine (PHENERGAN) injection 6.25-12.5 mg  6.25-12.5 mg Intravenous Q6H PRN Blenda Mounts, NP      . sodium chloride 0.9 % injection 10-40 mL  10-40 mL Intracatheter Q12H Maretta Bees, MD   10 mL at 05/19/12 0928  . sodium chloride 0.9 % injection 10-40 mL  10-40 mL Intracatheter PRN Maretta Bees, MD   10 mL at 05/19/12 2347  . sodium chloride 0.9 % injection        10 mL at 05/19/12 1540  . TPN (CLINIMIX) +/- additives   Intravenous Continuous TPN  Maretta Bees, MD 42 mL/hr at 05/18/12 1747    . DISCONTD: potassium chloride 10 mEq in 100 mL IVPB  10 mEq Intravenous Q1 Hr x 5 Blenda Mounts, NP        PE: General appearance: alert, cooperative and no distress Lungs: clear to auscultation bilaterally Heart: irregularly irregular rhythm Extremities: No LEE Pulses: 2+ and symmetric Neurologic: Grossly normal  Lab Results:   Basename 05/19/12 0415 05/18/12 0455  WBC 14.4* 12.4*  HGB 8.7* 9.0*  HCT 24.8* 26.4*  PLT 285 299   BMET  Basename 05/20/12 0430 05/19/12 1704 05/19/12 0415  NA 141 145 145  K 5.1 3.7 3.2*  CL 106 106 105  CO2 30 32 35*  GLUCOSE 365* 200* 156*  BUN 16 21 22   CREATININE 0.97 1.17* 1.24*  CALCIUM 8.3* 8.4 8.5   PT/INR No results found for this basename: LABPROT:3,INR:3 in the last 72 hours Cholesterol  Basename 05/19/12 0415  CHOL 111      Assessment/Plan  Principal Problem:  *Respiratory distress Active Problems:  History of colon cancer, stage III  Cholecystitis chronic, acute, S/P lap cholecystectomy 05/14/13  Thoracic aortic aneurysm, 4.8cm 2011  CHF, past NICM with an EF of 30%, most recent echo 2011 showed EF to be45- 50%  Chronic atrial fibrillation, not felt to be a Coumadin candidate in the past  Acute on chronic renal insufficiency  Normal coronary arteries, 2008  Diabetes mellitus  HTN (hypertension)  ARF (acute renal failure)  Atrial fibrillation with RVR  Postoperative ileus  Small bowel obstruction due to adhesions  Bile leak, postoperative  Hypokalemia  Plan:  Change to PO diltiazem and able.  HR controlled.  BP stable.    LOS: 8 days    HAGER, BRYAN 05/20/2012 9:08 AM  I have seen and examined the patient along with HAGER, BRYAN, PA.  I have reviewed the chart, notes and new data.  I agree with PA's note.  AF with good ventricular rate control at rest. HR faster when working with PT, but appropriate for the degree of discomfort she is in. No other  changes to meds planned at this time.May have to reconsider PO med strategy if her bowel problems worsen.   Thurmon Fair, MD, [facility] [phone] 05/20/2012, 3:50 PM

## 2012-05-20 NOTE — Progress Notes (Addendum)
PARENTERAL NUTRITION CONSULT NOTE - FOLLOW UP  Pharmacy Consult: TPN Indication:  SBO  Allergies  Allergen Reactions  . Iohexol      Code: VOM, Desc: PT REPORTS VOMITING W/ IVP DYE- ARS 12/26/08, Onset Date: [id number]     Patient Measurements: Height: 5\' 10"  (177.8 cm) Weight: 185 lb 3 oz (84 kg) IBW/kg (Calculated) : 68.5  Adjusted Body Weight: 74 kg  Vital Signs: Temp: 98.7 F (37.1 C) (10/29 0410) Temp src: Oral (10/29 0410) BP: 147/81 mmHg (10/29 0410) Pulse Rate: 80  (10/29 0410) Intake/Output from previous day: 10/28 0701 - 10/29 0700 In: 3643 [I.V.:1795; IV Piggyback:710; TPN:1138] Out: 2550 [Urine:1400; Emesis/NG output:1150]  Labs:  [facility] 05/19/12 0415 05/18/12 0455  WBC 14.4* 12.4*  HGB 8.7* 9.0*  HCT 24.8* 26.4*  PLT 285 299  APTT -- --  INR -- --     Basename 05/20/12 0430 05/19/12 1704 05/19/12 0415 05/18/12 0834 05/18/12 0455  NA 141 145 145 -- --  K 5.1 3.7 3.2* -- --  CL 106 106 105 -- --  CO2 30 32 35* -- --  GLUCOSE 365* 200* 156* -- --  BUN 16 21 22  -- --  CREATININE 0.97 1.17* 1.24* -- --  LABCREA -- -- -- -- --  CREAT24HRUR -- -- -- -- --  CALCIUM 8.3* 8.4 8.5 -- --  MG 1.6 -- 1.7 -- --  PHOS 2.0* -- 1.6* 2.6 --  PROT -- -- 5.8* -- 5.9*  ALBUMIN -- -- 2.2* 2.4* 2.4*2.4*  AST -- -- 15 -- 19  ALT -- -- 6 -- 7  ALKPHOS -- -- 68 -- 66  BILITOT -- -- 0.7 -- 0.7  BILIDIR -- -- -- -- 0.3  IBILI -- -- -- -- 0.4  PREALBUMIN -- -- 7.6* -- --  TRIG -- -- 72 -- --  CHOLHDL -- -- -- -- --  CHOL -- -- 111 -- --   Estimated Creatinine Clearance: 55.5 ml/min (by C-G formula based on Cr of 0.97).    Basename 05/20/12 0017 05/19/12 2024 05/19/12 1537  GLUCAP 194* 203* 201*      Insulin Requirements in the past 12 hours:  14 units from SSI + 20 units regular insulin in TPN  Current Nutrition:  TPN  Assessment:  78 year old woman who is s/p cholecyctectomy for cholelithiasis on 10/23. She had abdominal distension post-op and  radiology studies revealed a possible SBO. An NG tube was placed which improved her symptoms. On 10/26, she underwent an ERCP and had a stent placed in her bile duct for a possible leak. Unfortunately, she still has evidence of SBO/ileus and TPN was initiated 10/27.  GI: s/p lap cholecystectomy 10/23 and ERCP with stent placement on 10/26. Has NG to suction in place for abdominal distention and SBO/ileus.  NG O/P decreasing.  On Protonix 40mg  IV q12h. Endo: hx hypothyroid on Synthroid.  hx DM2 on metformin and glipizide PTA. CBGs trended up post TPN initiation.  CBGs variable 82-203.  Hesitant to increase insulin in TPN d/t CBG of 82. Lytes: 1st set of labs is contaminated; repeat labs - hypophosphatemia, Mag 1.8 (goal ~2 for Afib), others WNL Renal: Had some ARF post-op d/t hemodynamic renal injury, now resolved.  SCr 1.04, CrCL 52 ml/min, good UOP Pulm: 3L ==> 2L [place] Cards: hx CHF (EF 45-50%) / Afib / HTN - on diltiazem drip, not an anticoagulation candidate d/t hematoma and possible need for repeat surgery. Not on warfarin PTA either.  BP  mildly elevated, HR within goal Hepatobil: LFTs WNL  Neuro: no issues ID: Cipro/Flagyl since 10/25. Afebrile, WBC up 21.1. 10/25 urine cx negative. Best Practices: SCD for VTE prophylaxis  TPN Access: PICC placed 10/27 TPN day#: 3  Nutritional Goals:  1800 kCal, 90-100 grams of protein per day   Plan:  - Increase Clinimix E 5/15 to 60 ml/hr (goal 83 ml/hr).  Advance to goal rate when CBGs are more stable. - Supplement lipids, multivitamins, and trace elements only on M-W-F due to shortages - Magnesium sulfate 2gm IV x 1 - Sodium glycerophosphate 20 mmol IV x 1 - Increase insulin to 40 units per bag (proportionate increase d/t increase in TPN bag size) - F/U AM labs / CBGs    Camp Gopal D. Laney Potash, PharmD, BCPS Pager:  [phone] 05/20/2012, 10:50 AM

## 2012-05-20 NOTE — Progress Notes (Signed)
Agree with above.  -KUB on Thurs -mobilize/OOBTC -con't NGT/TNA

## 2012-05-21 ENCOUNTER — Encounter (HOSPITAL_COMMUNITY): Payer: Self-pay | Admitting: Gastroenterology

## 2012-05-21 ENCOUNTER — Inpatient Hospital Stay (HOSPITAL_COMMUNITY): Payer: Medicare Other

## 2012-05-21 LAB — PHOSPHORUS: Phosphorus: 2.6 mg/dL (ref 2.3–4.6)

## 2012-05-21 LAB — CBC
MCHC: 34.6 g/dL (ref 30.0–36.0)
Platelets: 374 10*3/uL (ref 150–400)
RDW: 15.6 % — ABNORMAL HIGH (ref 11.5–15.5)
WBC: 26.4 10*3/uL — ABNORMAL HIGH (ref 4.0–10.5)

## 2012-05-21 LAB — BASIC METABOLIC PANEL
CO2: 27 mEq/L (ref 19–32)
Chloride: 105 mEq/L (ref 96–112)
GFR calc Af Amer: 70 mL/min — ABNORMAL LOW (ref >90)
Potassium: 3.4 mEq/L — ABNORMAL LOW (ref 3.5–5.1)
Sodium: 141 mEq/L (ref 135–145)

## 2012-05-21 LAB — GLUCOSE, CAPILLARY
Glucose-Capillary: 195 mg/dL — ABNORMAL HIGH (ref 70–99)
Glucose-Capillary: 192 mg/dL — ABNORMAL HIGH (ref 70–99)
Glucose-Capillary: 243 mg/dL — ABNORMAL HIGH (ref 70–99)
Glucose-Capillary: 244 mg/dL — ABNORMAL HIGH (ref 70–99)
Glucose-Capillary: 186 mg/dL — ABNORMAL HIGH (ref 70–99)

## 2012-05-21 LAB — MAGNESIUM: Magnesium: 1.8 mg/dL (ref 1.5–2.5)

## 2012-05-21 MED ORDER — FAT EMULSION 20 % IV EMUL
250.0000 mL | INTRAVENOUS | Status: AC
  Administered 2012-05-21: 250 mL via INTRAVENOUS
  Filled 2012-05-21: qty 250

## 2012-05-21 MED ORDER — TRACE MINERALS CR-CU-F-FE-I-MN-MO-SE-ZN IV SOLN
INTRAVENOUS | Status: AC
  Administered 2012-05-21: 18:00:00 via INTRAVENOUS
  Filled 2012-05-21: qty 2000

## 2012-05-21 MED ORDER — MAGNESIUM SULFATE 40 MG/ML IJ SOLN
2.0000 g | Freq: Once | INTRAMUSCULAR | Status: AC
  Administered 2012-05-21: 2 g via INTRAVENOUS
  Filled 2012-05-21 (×2): qty 50

## 2012-05-21 MED ORDER — MORPHINE SULFATE 2 MG/ML IJ SOLN
1.0000 mg | INTRAMUSCULAR | Status: DC | PRN
  Administered 2012-05-21 – 2012-05-26 (×8): 2 mg via INTRAVENOUS
  Filled 2012-05-21 (×8): qty 1

## 2012-05-21 MED ORDER — PIPERACILLIN-TAZOBACTAM 3.375 G IVPB
3.3750 g | Freq: Three times a day (TID) | INTRAVENOUS | Status: AC
  Administered 2012-05-21 – 2012-05-30 (×27): 3.375 g via INTRAVENOUS
  Filled 2012-05-21 (×31): qty 50

## 2012-05-21 MED ORDER — POTASSIUM CHLORIDE 10 MEQ/50ML IV SOLN
10.0000 meq | INTRAVENOUS | Status: AC
  Administered 2012-05-21 (×4): 10 meq via INTRAVENOUS
  Filled 2012-05-21 (×4): qty 50

## 2012-05-21 NOTE — Progress Notes (Signed)
PARENTERAL NUTRITION CONSULT NOTE - FOLLOW UP  Pharmacy Consult: TPN Indication:  SBO  Allergies  Allergen Reactions  . Iohexol      Code: VOM, Desc: PT REPORTS VOMITING W/ IVP DYE- ARS 12/26/08, Onset Date: [id number]     Patient Measurements: Height: 5\' 10"  (177.8 cm) Weight: 185 lb 3 oz (84 kg) IBW/kg (Calculated) : 68.5  Adjusted Body Weight: 74 kg  Vital Signs: Temp: 98.4 F (36.9 C) (10/30 0800) Temp src: Oral (10/30 0800) BP: 157/81 mmHg (10/30 0800) Pulse Rate: 85  (10/30 0800) Intake/Output from previous day: 10/29 0701 - 10/30 0700 In: 3921.1 [I.V.:1560; IV Piggyback:1020; TPN:1341.1] Out: 2250 [Urine:1650; Emesis/NG output:600]  Labs:  [facility] 05/20/12 0850 05/19/12 0415  WBC 21.1* 14.4*  HGB 9.2* 8.7*  HCT 26.7* 24.8*  PLT 335 285  APTT -- --  INR -- --     Basename 05/21/12 0500 05/20/12 0850 05/20/12 0430 05/19/12 0415  NA 141 142 141 --  K 3.4* 4.0 5.1 --  CL 105 106 106 --  CO2 27 29 30  --  GLUCOSE 182* 216* 365* --  BUN 16 18 16  --  CREATININE 0.89 1.04 0.97 --  LABCREA -- -- -- --  CREAT24HRUR -- -- -- --  CALCIUM 8.5 8.5 8.3* --  MG 1.8 1.8 1.6 --  PHOS 2.6 2.1* 2.0* --  PROT -- -- -- 5.8*  ALBUMIN -- -- -- 2.2*  AST -- -- -- 15  ALT -- -- -- 6  ALKPHOS -- -- -- 68  BILITOT -- -- -- 0.7  BILIDIR -- -- -- --  IBILI -- -- -- --  PREALBUMIN -- -- -- 7.6*  TRIG -- -- -- 72  CHOLHDL -- -- -- --  CHOL -- -- -- 111   Estimated Creatinine Clearance: 60.4 ml/min (by C-G formula based on Cr of 0.89).    Basename 05/21/12 0754 05/21/12 0409 05/20/12 2340  GLUCAP 195* 192* 240*    Insulin Requirements in the past 12 hours:  16 units from SSI + 40 units regular insulin in TPN  Current Nutrition:  TPN  Assessment:  78 year old woman who is s/p cholecyctectomy for cholelithiasis on 10/23. She had abdominal distension post-op and radiology studies revealed a possible SBO. An NG tube was placed which improved her symptoms. On 10/26, she  underwent an ERCP and had a stent placed in her bile duct for a possible leak. Unfortunately, she still has evidence of SBO/ileus and TPN was initiated 10/27.  GI: s/p lap cholecystectomy 10/23 and ERCP with stent placement on 10/26. Has NG to suction in place for abdominal distention and SBO/ileus.  Still with NG tube output.  On Protonix 40mg  IV q12h. Endo: hx hypothyroid on Synthroid.  hx DM2 on metformin and glipizide PTA. CBGs trended up post TPN initiation.  CBGs remain elevated - 192-240.   Lytes: Potassium has decreased, Magnesium remains slightly below goal.  Phosphorus is at goal. Renal: Had some ARF post-op d/t hemodynamic renal injury, now resolved.  SCr 0.89, CrCL 60 ml/min, good UOP Pulm: 3L ==> 2L [place] Cards: hx CHF (EF 45-50%) / Afib / HTN - on diltiazem drip, not an anticoagulation candidate d/t hematoma and possible need for repeat surgery. Not on warfarin PTA either.  BP mildly elevated, HR within goal Hepatobil: LFTs WNL  Neuro: no issues ID: Cipro/Flagyl changed to Zosyn for increased intraabdominal coverage. Best Practices: SCD for VTE prophylaxis  TPN Access: PICC placed 10/27 TPN day#: 3  Nutritional  Goals:  1800 kCal, 90-100 grams of protein per day  Plan:  - Keep Clinimix E 5/15 at 60 ml/hr (goal 83 ml/hr).  Advance to goal rate when CBGs are more stable. - Supplement lipids, multivitamins, and trace elements only on M-W-F due to shortages - Magnesium sulfate 2gm IV x 1 - Give KCL runs x 4 - Increase insulin to 55 units per bag  - Check CMET, Mag, Phos with AM labs - Follow-up CBGs and insulin requirements  Estella Husk, Pharm.D., BCPS Clinical Pharmacist  Phone [phone] Pager [phone] 05/21/2012, 9:06 AM

## 2012-05-21 NOTE — Progress Notes (Signed)
4 Days Post-Op  Subjective: Had a small BM and passed some gas.  Still feels bloated.  Not much abdominal pain.  Objective: Vital signs in last 24 hours: Temp:  [98.3 F (36.8 C)-99.3 F (37.4 C)] 98.4 F (36.9 C) (10/30 0431) Pulse Rate:  [78-96] 96  (10/30 0431) Resp:  [21-32] 32  (10/30 0431) BP: (125-160)/(73-94) 160/93 mmHg (10/30 0431) SpO2:  [94 %-97 %] 97 % (10/30 0431) Last BM Date: 05/10/12  Intake/Output from previous day: 10/29 0701 - 10/30 0700 In: 2297 [I.V.:715; IV Piggyback:1020; TPN:562] Out: 1800 [Urine:1200; Emesis/NG output:600] Intake/Output this shift:    PE: Abd-soft but distended, no bowel sounds, small amount of old bloody drainage from umbilical wound, periumbilical ecchymosis  Lab Results:   [facility] 05/20/12 0850 05/19/12 0415  WBC 21.1* 14.4*  HGB 9.2* 8.7*  HCT 26.7* 24.8*  PLT 335 285   BMET  Basename 05/21/12 0500 05/20/12 0850  NA 141 142  K 3.4* 4.0  CL 105 106  CO2 27 29  GLUCOSE 182* 216*  BUN 16 18  CREATININE 0.89 1.04  CALCIUM 8.5 8.5   PT/INR No results found for this basename: LABPROT:2,INR:2 in the last 72 hours Comprehensive Metabolic Panel:    Component Value Date/Time   NA 141 05/21/2012 0500   K 3.4* 05/21/2012 0500   CL 105 05/21/2012 0500   CO2 27 05/21/2012 0500   BUN 16 05/21/2012 0500   CREATININE 0.89 05/21/2012 0500   GLUCOSE 182* 05/21/2012 0500   CALCIUM 8.5 05/21/2012 0500   AST 15 05/19/2012 0415   ALT 6 05/19/2012 0415   ALKPHOS 68 05/19/2012 0415   BILITOT 0.7 05/19/2012 0415   PROT 5.8* 05/19/2012 0415   ALBUMIN 2.2* 05/19/2012 0415     Studies/Results: Dg Chest Port 1 View  05/19/2012  *RADIOLOGY REPORT*  Clinical Data: PICC line placement.  PORTABLE CHEST - 1 VIEW  Comparison: One-view chest 05/18/2012.  Findings: Mild cardiac enlargement is stable.  The sideport of an NG tube is in the stomach.  A new right-sided PICC line is in place.  The tip is in the distal SVC, above the  cavoatrial junction.  The lung volumes remain low.  Mild pulmonary vascular congestion is stable.  IMPRESSION:  1.  Stable position of the right-sided PICC line in the lower SVC. The 2.  Persistent low lung volumes. 3.  Mild cardiomegaly and pulmonary vascular congestion.   Original Report Authenticated By: Jamesetta Orleans. MATTERN, M.D.     Anti-infectives: Anti-infectives     Start     Dose/Rate Route Frequency Ordered Stop   05/18/12 2200   ciprofloxacin (CIPRO) IVPB 400 mg        400 mg 200 mL/hr over 60 Minutes Intravenous Every 12 hours 05/18/12 1457     05/16/12 1000   ciprofloxacin (CIPRO) IVPB 200 mg  Status:  Discontinued        200 mg 100 mL/hr over 60 Minutes Intravenous Every 24 hours 05/16/12 0846 05/18/12 1457   05/16/12 0800   metroNIDAZOLE (FLAGYL) IVPB 500 mg        500 mg 100 mL/hr over 60 Minutes Intravenous Every 8 hours 05/16/12 0735     05/14/12 0600   ceFAZolin (ANCEF) IVPB 2 g/50 mL premix        2 g 100 mL/hr over 30 Minutes Intravenous On call to O.R. 05/13/12 1424 05/14/12 1407          Assessment Principal Problem:  *Respiratory distress Active  Problems:  Cholecystitis chronic, acute, S/P lap cholecystectomy 05/14/13  Postoperative ileus vs sbo-more like an ileus pattern  Bile leak, postoperative-WBC up yesterday.      LOS: 9 days   Plan: Recheck WBC, continue ng and TPN.  Needs better antibiotic coverage so will change to Zosyn.  Check abdominal x-ray tomorrow.   Sanvika Cuttino J 05/21/2012

## 2012-05-21 NOTE — Progress Notes (Signed)
Physical Therapy Treatment Patient Details Name: Nicole [name] MRN: [id number] DOB: 1930-08-16 Today's Date: 05/21/2012 Time: 8413-2440 PT Time Calculation (min): 38 min  PT Assessment / Plan / Recommendation Comments on Treatment Session  Pt admitted s/p laparoscopic cholecystectomy with biliary leak/stent placed. Pt motivated and progressing with PT with increased tolerance to ambulation distance today.    Follow Up Recommendations  Home health PT;Supervision - Intermittent     Does the patient have the potential to tolerate intense rehabilitation     Barriers to Discharge        Equipment Recommendations  Rolling walker with 5" wheels    Recommendations for Other Services    Frequency Min 3X/week   Plan Discharge plan remains appropriate;Frequency remains appropriate    Precautions / Restrictions Precautions Precautions: None Restrictions Weight Bearing Restrictions: No   Pertinent Vitals/Pain Max HR with exertion 119, Resting HR 98;  SaO2 on 3 L 94% with mild dyspnea    Mobility  Bed Mobility Bed Mobility: Supine to Sit Supine to Sit: 4: Min assist;HOB elevated Details for Bed Mobility Assistance: Assist for trunk to translate anterior with cues for sequence and safety. Transfers Transfers: Sit to Stand;Stand to Sit Sit to Stand: 4: Min assist;With upper extremity assist;From bed Stand to Sit: 4: Min guard;With upper extremity assist;To chair/3-in-1 Details for Transfer Assistance: vc's for hand placement; steadying/lifting assist to stand Ambulation/Gait Ambulation/Gait Assistance: 4: Min assist Ambulation Distance (Feet): 45 Feet Assistive device: Rolling walker Ambulation/Gait Assistance Details: mildly unsteady with RW; short step length Gait Pattern: Step-through pattern;Decreased stride length;Trunk flexed Gait velocity: decreased Stairs: No Wheelchair Mobility Wheelchair Mobility: No    Exercises     PT Diagnosis:    PT Problem List:   PT  Treatment Interventions:     PT Goals Acute Rehab PT Goals Time For Goal Achievement: 05/22/12 Potential to Achieve Goals: Good PT Goal: Supine/Side to Sit - Progress: Progressing toward goal PT Goal: Sit to Stand - Progress: Progressing toward goal PT Goal: Stand to Sit - Progress: Progressing toward goal PT Goal: Ambulate - Progress: Progressing toward goal  Visit Information  Last PT Received On: 05/21/12 Assistance Needed: +1    Subjective Data  Subjective: yeah, I think I need to walk   Cognition  Overall Cognitive Status: Appears within functional limits for tasks assessed/performed Arousal/Alertness: Awake/alert Orientation Level: Appears intact for tasks assessed Behavior During Session: [facility] for tasks performed    Balance  Balance Balance Assessed: No  End of Session PT - End of Session Equipment Utilized During Treatment: Oxygen Activity Tolerance: Patient tolerated treatment well Patient left: with call bell/phone within reach;in chair Nurse Communication: Mobility status   GP     Baila Rouse, Eliseo Gum 05/21/2012, 3:16 PM  05/21/2012  [place] Bing, PT [phone] (pager)

## 2012-05-21 NOTE — Progress Notes (Signed)
PATIENT DETAILS Name: Nicole [name] Age: 78 y.o. Sex: female Date of Birth: 19-Sep-1930 Admit Date: 05/12/2012 Admitting Physician Currie Paris, MD ZOX:[id number], [facility], DO  Brief Narrative: 78 year old female who was admitted on 05/12/2012 with 2-3 days of persistent right upper quadrant abdominal pain worse after eating. She had been evaluated in the emergency department 24 hours before admission and ultrasound revealed symptomatic cholelithiasis. She had been evaluated by surgery that time and plans were to proceed with elective cholecystectomy after patient stopped her Plavix and aspirin for one week. Unfortunately she returned to the ER because of uncontrolled pain with vomiting. sHe subsequently underwent a laparoscopic cholecystectomy on 05/14/2012. Postoperatively She developed severe abdominal pain associated with nausea and vomiting. HIDA scan revealed bile leak. In the interim she developed atrial fibrillation with RVR and was subsequentLY started on Cardizem drip. CT scan performed demonstrated small bowel obstruction that warranted NG tube placement and surgical consultation. She has subsequently undergone ERCP with stent placement on 05/17/2012.  Assessment/Plan:  Abdominal Distention 2/2 post operative partial SBO/ileus - Appreciate surgical team assistance -c/w NPO status, NG tube, and supportive care  -Bile Leak -following Lap Cholecystectomy 10/23 -S/P ERCP with Stent placement 10/26 -Initially was tx'd with empiric Cipro and Flagyl - WBC counts rising so surgery dc'd  In favor of Zosyn    Abdominal Wall hematoma -H/H stable at 9.2 -monitor H&H  daily  ARF -Resolved -appreciate Renal eval -suspicion for hemodynamic mediated renal injury-as patient was very briefly hypotensive on 10/24-retrospectively-hypotension probably was a result from developing abd wall hematoma/?intra-abdominal pathology  Afib with RVR -c/w cardizem gtt- currently at 15 mg/hr -not a  candidate for anticoagulation-given hematoma/potential for repeart surgery, more importantly has been deemed not a be candidate for anticoagulation by her cardiologist in the past as well-?non compliance -cards following- plan to convert to oral regimen once PSBO resolved  Acute hypoxic Resp Failure -stable with just 2-3 L of O2 -suspect 2/2 to abdominal pain as well as mechanical due to abdominal distention- this has led to atelectasis -may also have a degree of edema due to AF/RVR and third spacing from abdominal pathology -continue IS/mobilization  Cholecystitis -s/p Lap cholecystectomy on 10/23 -unfortunately complicated by abdominal wall hematoma and bile leak-see above  Hypokalemia -2/2 to GI loss via NG suction -replete and recheck - goal is K+ of 4.0 or >  Hypernatremia Resolved w/ addition of free water  DM -holding oral agents -CBG's elevated due to TNA- will start Q4 sliding scale insulin  CHF (systolic EF 45% and diastolic dysfunction-grade 1) -compensated -cards following -moderate TR due to pulmonary HTN  HTN -monitor and restart IV meds as needed  Thoracic Aortic Aneurysm -outpatient follow up  H/O Stage III colon Ca -outpatient follow up with Oncology  Disposition: Remain on SDU (cardizem gtt) DVT Prophylaxis: scd's Code Status: Full code   Procedures:  Laparoscopic Cholecystectomy 10/23   ERCP 10/26  CONSULTS: Cardiology  Nephrology GI  General Surgery  Subjective: Pt c/o of trouble taking deep breath due to distension of abdomen. No abdominal pain.   PHYSICAL EXAM: Vital signs in last 24 hours: Filed Vitals:   05/21/12 0431 05/21/12 0700 05/21/12 0800 05/21/12 1200  BP: 160/93  157/81 164/76  Pulse: 96  85 90  Temp: 98.4 F (36.9 C) 98.4 F (36.9 C) 98.4 F (36.9 C) 98.3 F (36.8 C)  TempSrc: Oral Oral Oral Oral  Resp: 32  22 31  Height:      Weight:  SpO2: 97%  98% 98%    Weight change:  Body mass index is 26.57  kg/(m^2).   Gen Exam: Awake and alert with clear speech.   Neck: Supple, No JVD.   Chest: B/L Clear.   CVS: S1 S2 Regular, tachycardia.  Abdomen: distended, non-tender, BS decreased. NGT to LWS-dark green bilious returns-very smallBM today Extremities: no edema, lower extremities warm to touch. Neurologic: Non Focal.   Skin: No Rash.   Wounds: N/A.    Intake/Output from previous day:  Intake/Output Summary (Last 24 hours) at 05/21/12 1235 Last data filed at 05/21/12 1225  Gross per 24 hour  Intake 3541.1 ml  Output   2600 ml  Net  941.1 ml    LAB RESULTS: CBC  Lab 05/21/12 0830 05/20/12 0850 05/19/12 0415 05/18/12 0455 05/17/12 0555 05/16/12 0143  WBC 26.4* 21.1* 14.4* 12.4* 13.7* --  HGB 9.2* 9.2* 8.7* 9.0* 9.9* --  HCT 26.6* 26.7* 24.8* 26.4* 27.4* --  PLT 374 335 285 299 266 --  MCV 81.8 82.4 82.9 82.0 82.0 --  MCH 28.3 28.4 29.1 28.0 29.6 --  MCHC 34.6 34.5 35.1 34.1 36.1* --  RDW 15.6* 15.4 15.3 15.3 15.4 --  LYMPHSABS -- -- 0.8 -- -- 0.7  MONOABS -- -- 2.6* -- -- 1.8*  EOSABS -- -- 0.1 -- -- 0.0  BASOSABS -- -- 0.0 -- -- 0.0  BANDABS -- -- -- -- -- --    Chemistries   Lab 05/21/12 0500 05/20/12 0850 05/20/12 0430 05/19/12 1704 05/19/12 0415 05/17/12 0555  NA 141 142 141 145 145 --  K 3.4* 4.0 5.1 3.7 3.2* --  CL 105 106 106 106 105 --  CO2 27 29 30  32 35* --  GLUCOSE 182* 216* 365* 200* 156* --  BUN 16 18 16 21 22  --  CREATININE 0.89 1.04 0.97 1.17* 1.24* --  CALCIUM 8.5 8.5 8.3* 8.4 8.5 --  MG 1.8 1.8 1.6 -- 1.7 1.7    CBG:  Lab 05/21/12 1153 05/21/12 0754 05/21/12 0409 05/20/12 2340 05/20/12 2109  GLUCAP 243* 195* 192* 240* 92    Basename 05/19/12 0415  CHOL 111  HDL --  LDLCALC --  TRIG 72  CHOLHDL --  LDLDIRECT --   MICROBIOLOGY: Recent Results (from the past 240 hour(s))  SURGICAL PCR SCREEN     Status: Abnormal   Collection Time   05/14/12  4:10 AM      Component Value Range Status Comment   MRSA, PCR NEGATIVE  NEGATIVE Final     Staphylococcus aureus POSITIVE (*) NEGATIVE Final   URINE CULTURE     Status: Normal   Collection Time   05/16/12  1:50 AM      Component Value Range Status Comment   Specimen Description URINE, CATHETERIZED   Final    Special Requests NONE   Final    Culture  Setup Time 05/16/2012 02:27   Final    Colony Count NO GROWTH   Final    Culture NO GROWTH   Final    Report Status 05/16/2012 FINAL   Final    MEDICATIONS: Reviewed by M.D.  Junious Silk L.,ANP  [facility] Pager:[phone]  If 7PM-7AM, please contact night-coverage [email or web address] Password [email or web address] 05/21/2012, 12:35 PM   LOS: 9 days   I have personally examined this patient and reviewed the entire database. I have reviewed the above note, made any necessary editorial changes, and agree with its content.  Lonia Blood, MD [facility]

## 2012-05-21 NOTE — Progress Notes (Signed)
ANTIBIOTIC CONSULT NOTE - INITIAL  Pharmacy Consult for Zosyn Indication: peritonitis  Allergies  Allergen Reactions  . Iohexol      Code: VOM, Desc: PT REPORTS VOMITING W/ IVP DYE- ARS 12/26/08, Onset Date: [id number]     Patient Measurements: Height: 5\' 10"  (177.8 cm) Weight: 185 lb 3 oz (84 kg) IBW/kg (Calculated) : 68.5   Vital Signs: Temp: 98.4 F (36.9 C) (10/30 0700) Temp src: Oral (10/30 0700) BP: 160/93 mmHg (10/30 0431) Pulse Rate: 96  (10/30 0431) Intake/Output from previous day: 10/29 0701 - 10/30 0700 In: 2297 [I.V.:715; IV Piggyback:1020; TPN:562] Out: 2250 [Urine:1650; Emesis/NG output:600] Intake/Output from this shift:    Labs:  Basename 05/21/12 0500 05/20/12 0850 05/20/12 0430 05/19/12 0415  WBC -- 21.1* -- 14.4*  HGB -- 9.2* -- 8.7*  PLT -- 335 -- 285  LABCREA -- -- -- --  CREATININE 0.89 1.04 0.97 --   Estimated Creatinine Clearance: 60.4 ml/min (by C-G formula based on Cr of 0.89). No results found for this basename: VANCOTROUGH:2,VANCOPEAK:2,VANCORANDOM:2,GENTTROUGH:2,GENTPEAK:2,GENTRANDOM:2,TOBRATROUGH:2,TOBRAPEAK:2,TOBRARND:2,AMIKACINPEAK:2,AMIKACINTROU:2,AMIKACIN:2, in the last 72 hours   Microbiology: Recent Results (from the past 720 hour(s))  SURGICAL PCR SCREEN     Status: Abnormal   Collection Time   05/14/12  4:10 AM      Component Value Range Status Comment   MRSA, PCR NEGATIVE  NEGATIVE Final    Staphylococcus aureus POSITIVE (*) NEGATIVE Final   URINE CULTURE     Status: Normal   Collection Time   05/16/12  1:50 AM      Component Value Range Status Comment   Specimen Description URINE, CATHETERIZED   Final    Special Requests NONE   Final    Culture  Setup Time 05/16/2012 02:27   Final    Colony Count NO GROWTH   Final    Culture NO GROWTH   Final    Report Status 05/16/2012 FINAL   Final     Medical History: Past Medical History  Diagnosis Date  . Colon cancer 07/1997  . Hypertension   . Diabetes mellitus   . CHF  (congestive heart failure)   . AAA (abdominal aortic aneurysm)   . Atrial fibrillation   . Hypercholesteremia   . Kidney stone     renal calculi  . Small bowel obstruction due to adhesions 05/16/2012    Medications:  Scheduled:    . bisacodyl  10 mg Rectal Once  . insulin aspart  0-15 Units Subcutaneous Q4H  . levothyroxine  26 mcg Intravenous QAC breakfast  . magnesium sulfate 1 - 4 g bolus IVPB  2 g Intravenous Once  . pantoprazole (PROTONIX) IV  40 mg Intravenous Q12H  . sodium chloride  10-40 mL Intracatheter Q12H  . sodium glycerophosphate 0.9% NaCl IVPB  20 mmol Intravenous Once  . DISCONTD: ciprofloxacin  400 mg Intravenous Q12H  . DISCONTD: insulin aspart  0-15 Units Subcutaneous Q4H  . DISCONTD: insulin aspart  0-9 Units Subcutaneous Q4H  . DISCONTD: metronidazole  500 mg Intravenous Q8H   Infusions:    . dextrose 5 % and 0.45% NaCl 1,000 mL with potassium chloride 40 mEq infusion 50 mL/hr at 05/21/12 0600  . diltiazem (CARDIZEM) infusion 15 mg/hr (05/21/12 0600)  . TPN (CLINIMIX) +/- additives 42 mL/hr at 05/20/12 1400   And  . fat emulsion 250 mL (05/20/12 1700)  . TPN (CLINIMIX) +/- additives 60 mL/hr at 05/21/12 0600   Assessment: 78 y/o female patient POD#4 s/p lap chole, now with sbo and possible  peritonitis requiring broad spectrum antibiotics. Change cipro/flagyl to zosyn.  Plan:  Zosyn 3.375g IV q8h and monitor renal function.  Verlene Mayer, PharmD, BCPS Pager [phone] 05/21/2012,8:23 AM

## 2012-05-22 ENCOUNTER — Inpatient Hospital Stay (HOSPITAL_COMMUNITY): Payer: Medicare Other

## 2012-05-22 DIAGNOSIS — K567 Ileus, unspecified: Secondary | ICD-10-CM | POA: Diagnosis not present

## 2012-05-22 LAB — GLUCOSE, CAPILLARY
Glucose-Capillary: 234 mg/dL — ABNORMAL HIGH (ref 70–99)
Glucose-Capillary: 225 mg/dL — ABNORMAL HIGH (ref 70–99)
Glucose-Capillary: 250 mg/dL — ABNORMAL HIGH (ref 70–99)
Glucose-Capillary: 174 mg/dL — ABNORMAL HIGH (ref 70–99)

## 2012-05-22 LAB — COMPREHENSIVE METABOLIC PANEL
ALT: 6 U/L (ref 0–35)
AST: 12 U/L (ref 0–37)
Albumin: 2.3 g/dL — ABNORMAL LOW (ref 3.5–5.2)
Calcium: 8.7 mg/dL (ref 8.4–10.5)
GFR calc Af Amer: 73 mL/min — ABNORMAL LOW (ref >90)
Glucose, Bld: 247 mg/dL — ABNORMAL HIGH (ref 70–99)
Potassium: 4.2 mEq/L (ref 3.5–5.1)
Sodium: 140 mEq/L (ref 135–145)
Total Protein: 6.1 g/dL (ref 6.0–8.3)

## 2012-05-22 LAB — CBC
HCT: 26.5 % — ABNORMAL LOW (ref 36.0–46.0)
Hemoglobin: 9.4 g/dL — ABNORMAL LOW (ref 12.0–15.0)
MCH: 28.9 pg (ref 26.0–34.0)
MCHC: 35.5 g/dL (ref 30.0–36.0)
MCV: 81.5 fL (ref 78.0–100.0)
Platelets: 392 10*3/uL (ref 150–400)
RBC: 3.25 MIL/uL — ABNORMAL LOW (ref 3.87–5.11)
RDW: 15.6 % — ABNORMAL HIGH (ref 11.5–15.5)
WBC: 31.4 10*3/uL — ABNORMAL HIGH (ref 4.0–10.5)

## 2012-05-22 MED ORDER — SODIUM GLYCEROPHOSPHATE 1 MMOLE/ML IV SOLN
10.0000 mmol | Freq: Once | INTRAVENOUS | Status: AC
  Administered 2012-05-22: 10 mmol via INTRAVENOUS
  Filled 2012-05-22: qty 10

## 2012-05-22 MED ORDER — INSULIN REGULAR HUMAN 100 UNIT/ML IJ SOLN
INTRAVENOUS | Status: AC
  Administered 2012-05-22: 17:00:00 via INTRAVENOUS
  Filled 2012-05-22: qty 2000

## 2012-05-22 MED ORDER — SODIUM CHLORIDE 0.9 % IV SOLN
INTRAVENOUS | Status: AC
  Administered 2012-05-22: 09:00:00 via INTRAVENOUS

## 2012-05-22 MED ORDER — MAGNESIUM SULFATE 40 MG/ML IJ SOLN
2.0000 g | Freq: Once | INTRAMUSCULAR | Status: AC
Start: 2012-05-22 — End: 2012-05-22
  Administered 2012-05-22: 2 g via INTRAVENOUS
  Filled 2012-05-22: qty 50

## 2012-05-22 MED ORDER — INSULIN GLARGINE 100 UNIT/ML ~~LOC~~ SOLN
10.0000 [IU] | Freq: Two times a day (BID) | SUBCUTANEOUS | Status: DC
  Administered 2012-05-22 – 2012-05-26 (×9): 10 [IU] via SUBCUTANEOUS

## 2012-05-22 MED ORDER — SODIUM CHLORIDE 0.9 % IJ SOLN
INTRAMUSCULAR | Status: AC
  Administered 2012-05-23: 10 mL
  Filled 2012-05-22: qty 10

## 2012-05-22 MED ORDER — INSULIN ASPART 100 UNIT/ML ~~LOC~~ SOLN
0.0000 [IU] | Freq: Three times a day (TID) | SUBCUTANEOUS | Status: DC
  Administered 2012-05-22: 7 [IU] via SUBCUTANEOUS
  Administered 2012-05-22 – 2012-05-23 (×3): 4 [IU] via SUBCUTANEOUS
  Administered 2012-05-24 (×2): 3 [IU] via SUBCUTANEOUS
  Administered 2012-05-24: 4 [IU] via SUBCUTANEOUS
  Administered 2012-05-25: 7 [IU] via SUBCUTANEOUS
  Administered 2012-05-25 – 2012-05-26 (×3): 3 [IU] via SUBCUTANEOUS

## 2012-05-22 NOTE — Progress Notes (Signed)
Utilization review completed.  

## 2012-05-22 NOTE — Progress Notes (Signed)
Pt was alone when I arrived. She recognized me and said she was tired from tests earlier. However, she wanted me to have prayer with her, which I did, and left.   Marjory Lies Chaplain

## 2012-05-22 NOTE — Progress Notes (Signed)
PATIENT DETAILS Name: Nicole [name] Age: 78 y.o. Sex: female Date of Birth: 05/01/1931 Admit Date: 05/12/2012 Admitting Physician Currie Paris, MD [id number], [facility], DO  Brief Narrative: 78 year old female who was admitted on 05/12/2012 with 2-3 days of persistent right upper quadrant abdominal pain worse after eating. She had been evaluated in the emergency department 24 hours before admission and ultrasound revealed symptomatic cholelithiasis. She had been evaluated by surgery that time and plans were to proceed with elective cholecystectomy after patient stopped her Plavix and aspirin for one week. Unfortunately she returned to the ER because of uncontrolled pain with vomiting. sHe subsequently underwent a laparoscopic cholecystectomy on 05/14/2012. Postoperatively She developed severe abdominal pain associated with nausea and vomiting. HIDA scan revealed bile leak. In the interim she developed atrial fibrillation with RVR and was subsequentLY started on Cardizem drip. CT scan performed demonstrated small bowel obstruction that warranted NG tube placement and surgical consultation. She has subsequently undergone ERCP with stent placement on 05/17/2012.  Assessment/Plan:  Abdominal Distention 2/2 post operative partial SBO/ileus - Appreciate surgical team assistance -c/w NPO status, NG tube, and supportive care -Abdominal distention is contributing to mechanical respiratory problems i.e. limited inspiration due to elevated diaphragm from abdominal distention  -Bile Leak -following Lap Cholecystectomy 10/23 -S/P ERCP with Stent placement 10/26 -Initially was tx'd with empiric Cipro and Flagyl - WBC counts rising so surgery dc'd  In favor of Zosyn   -White count has increased to greater than 31,000 so surgical team is ordering a CT of the abdomen and pelvis likely to rule out underlying biloma  Abdominal Wall hematoma -H/H stable at 9.2 -monitor H&H   daily  ARF -Resolved -appreciate Renal eval -suspicion for hemodynamic mediated renal injury-as patient was very briefly hypotensive on 10/24-retrospectively-hypotension probably was a result from developing abd wall hematoma/?intra-abdominal pathology  Afib with RVR -c/w cardizem gtt- currently at 15 mg/hr -not a candidate for anticoagulation-given hematoma/potential for repeart surgery, more importantly has been deemed not a be candidate for anticoagulation by her cardiologist in the past as well-?non compliance -cards following- plan to convert to oral regimen once PSBO resolved  Acute hypoxic Resp Failure -stable with just 2-3 L of O2 -suspect 2/2 to abdominal pain as well as mechanical due to abdominal distention- this has led to atelectasis -may also have a degree of edema due to AF/RVR and third spacing from abdominal pathology -continue IS/mobilization  Cholecystitis -s/p Lap cholecystectomy on 10/23 -unfortunately complicated by abdominal wall hematoma and bile leak-see above  Hypokalemia -2/2 to GI loss via NG suction -replete and recheck - goal is K+ of 4.0 or >  Hypernatremia Resolved w/ addition of free water  DM-uncontrolled -holding oral agents -CBG's main elevated greater than due to TNA and despite resistant sliding scale insulin -Will add twice a day Lantus 10 units with first dose now -Likely underlying infectious process also contributing to poorly controlled diabetes  CHF (systolic EF 45% and diastolic dysfunction-grade 1) -compensated -cards following -moderate TR due to pulmonary HTN  HTN -monitor and restart IV meds as needed  Thoracic Aortic Aneurysm -outpatient follow up  H/O Stage III colon Ca -outpatient follow up with Oncology  Disposition: Remain on SDU (cardizem gtt) DVT Prophylaxis: scd's Code Status: Full code   Procedures:  Laparoscopic Cholecystectomy 10/23   ERCP 10/26  CONSULTS: Cardiology  Nephrology GI  General  Surgery  Subjective: Pt c/o of trouble taking deep breath due to distension of abdomen. No abdominal pain.   PHYSICAL EXAM:  Vital signs in last 24 hours: Filed Vitals:   05/22/12 0454 05/22/12 0500 05/22/12 0800 05/22/12 1200  BP:  159/81 145/87 152/81  Pulse:  83 83 87  Temp: 98.2 F (36.8 C)  98.2 F (36.8 C) 98.5 F (36.9 C)  TempSrc: Oral  Oral Oral  Resp:  17 19 24   Height:      Weight:      SpO2:  98% 99% 98%    Weight change:  Body mass index is 26.57 kg/(m^2).   Gen Exam: Awake and alert with clear speech.   Neck: Supple, No JVD.   Chest: B/L Clear.   CVS: S1 S2 Regular, tachycardia.  Abdomen: Seems more distended, she is becoming slightly tender in the epigastric region, BS decreased more today and seemed to be absent. NGT to LWS-dark green bilious returns Extremities: no edema, lower extremities warm to touch. Neurologic: Non Focal.   Skin: No Rash.   Wounds: N/A.    Intake/Output from previous day:  Intake/Output Summary (Last 24 hours) at 05/22/12 1351 Last data filed at 05/22/12 1300  Gross per 24 hour  Intake 1922.94 ml  Output   2050 ml  Net -127.06 ml    LAB RESULTS: CBC  Lab 05/22/12 0800 05/21/12 0830 05/20/12 0850 05/19/12 0415 05/18/12 0455 05/16/12 0143  WBC 31.4* 26.4* 21.1* 14.4* 12.4* --  HGB 9.4* 9.2* 9.2* 8.7* 9.0* --  HCT 26.5* 26.6* 26.7* 24.8* 26.4* --  PLT 392 374 335 285 299 --  MCV 81.5 81.8 82.4 82.9 82.0 --  MCH 28.9 28.3 28.4 29.1 28.0 --  MCHC 35.5 34.6 34.5 35.1 34.1 --  RDW 15.6* 15.6* 15.4 15.3 15.3 --  LYMPHSABS -- -- -- 0.8 -- 0.7  MONOABS -- -- -- 2.6* -- 1.8*  EOSABS -- -- -- 0.1 -- 0.0  BASOSABS -- -- -- 0.0 -- 0.0  BANDABS -- -- -- -- -- --    Chemistries   Lab 05/22/12 0500 05/21/12 0500 05/20/12 0850 05/20/12 0430 05/19/12 1704 05/19/12 0415  NA 140 141 142 141 145 --  K 4.2 3.4* 4.0 5.1 3.7 --  CL 106 105 106 106 106 --  CO2 29 27 29 30  32 --  GLUCOSE 247* 182* 216* 365* 200* --  BUN 16 16 18 16 21   --  CREATININE 0.86 0.89 1.04 0.97 1.17* --  CALCIUM 8.7 8.5 8.5 8.3* 8.4 --  MG 1.9 1.8 1.8 1.6 -- 1.7    CBG:  Lab 05/22/12 1116 05/22/12 0800 05/22/12 0452 05/22/12 0024 05/21/12 2100  GLUCAP 250* 225* 194* 234* 186*   No results found for this basename: CHOL:2,HDL:2,LDLCALC:2,TRIG:2,CHOLHDL:2,LDLDIRECT:2 in the last 72 hours MICROBIOLOGY: Recent Results (from the past 240 hour(s))  SURGICAL PCR SCREEN     Status: Abnormal   Collection Time   05/14/12  4:10 AM      Component Value Range Status Comment   MRSA, PCR NEGATIVE  NEGATIVE Final    Staphylococcus aureus POSITIVE (*) NEGATIVE Final   URINE CULTURE     Status: Normal   Collection Time   05/16/12  1:50 AM      Component Value Range Status Comment   Specimen Description URINE, CATHETERIZED   Final    Special Requests NONE   Final    Culture  Setup Time 05/16/2012 02:27   Final    Colony Count NO GROWTH   Final    Culture NO GROWTH   Final    Report Status 05/16/2012 FINAL  Final    MEDICATIONS: Reviewed by M.D.  Junious Silk L.,ANP  [facility] Pager:[phone]  If 7PM-7AM, please contact night-coverage [email or web address] Password [email or web address] 05/22/2012, 1:51 PM   LOS: 10 days    I have examined the patient and reviewed the chart. I agree with the above note that I have modified.   Calvert Cantor, MD [phone]

## 2012-05-22 NOTE — Progress Notes (Signed)
Patient ID: Nicole [name], female   DOB: Apr 19, 1931, 78 y.o.   MRN: [id number] 5 Days Post-Op  Subjective: Pt wo complaints, Still feels bloated.  Not much abdominal pain.  Objective: Vital signs in last 24 hours: Temp:  [97.5 F (36.4 C)-98.4 F (36.9 C)] 98.2 F (36.8 C) (10/31 0454) Pulse Rate:  [83-104] 83  (10/31 0500) Resp:  [17-31] 17  (10/31 0500) BP: (153-164)/(74-85) 159/81 mmHg (10/31 0500) SpO2:  [97 %-99 %] 98 % (10/31 0500) Last BM Date: 05/10/12  Intake/Output from previous day: 10/30 0701 - 10/31 0700 In: 1975.1 [I.V.:715; IV Piggyback:350; TPN:910.1] Out: 2050 [Urine:1650; Emesis/NG output:400] Intake/Output this shift:    PE: Abd-soft but distended, no bowel sounds, incisions c/d/i  Lab Results:   Basename 05/21/12 0830 05/20/12 0850  WBC 26.4* 21.1*  HGB 9.2* 9.2*  HCT 26.6* 26.7*  PLT 374 335   BMET  Basename 05/22/12 0500 05/21/12 0500  NA 140 141  K 4.2 3.4*  CL 106 105  CO2 29 27  GLUCOSE 247* 182*  BUN 16 16  CREATININE 0.86 0.89  CALCIUM 8.7 8.5   PT/INR No results found for this basename: LABPROT:2,INR:2 in the last 72 hours Comprehensive Metabolic Panel:    Component Value Date/Time   NA 140 05/22/2012 0500   K 4.2 05/22/2012 0500   CL 106 05/22/2012 0500   CO2 29 05/22/2012 0500   BUN 16 05/22/2012 0500   CREATININE 0.86 05/22/2012 0500   GLUCOSE 247* 05/22/2012 0500   CALCIUM 8.7 05/22/2012 0500   AST 12 05/22/2012 0500   ALT 6 05/22/2012 0500   ALKPHOS 76 05/22/2012 0500   BILITOT 0.7 05/22/2012 0500   PROT 6.1 05/22/2012 0500   ALBUMIN 2.3* 05/22/2012 0500     Studies/Results: Dg Abd Portable 1v  05/21/2012  *RADIOLOGY REPORT*  Clinical Data: Ileus versus small bowel obstruction, follow-up  PORTABLE ABDOMEN - 1 VIEW  Comparison: Abdomen films of 05/19/2012  Findings: There is still a significantly dilated loop of small bowel in the left mid abdomen suspicious for persistent small bowel obstruction.  Some colonic  bowel gas is seen but the colon is not distended.  An NG tube remains.  IMPRESSION: Persistently dilated small bowel loop in the left midabdomen consistent with partial small bowel obstruction.   Original Report Authenticated By: Juline Patch, M.D.     Anti-infectives: Anti-infectives     Start     Dose/Rate Route Frequency Ordered Stop   05/21/12 0900  piperacillin-tazobactam (ZOSYN) IVPB 3.375 g       3.375 g 12.5 mL/hr over 240 Minutes Intravenous Every 8 hours 05/21/12 0827     05/18/12 2200   ciprofloxacin (CIPRO) IVPB 400 mg  Status:  Discontinued        400 mg 200 mL/hr over 60 Minutes Intravenous Every 12 hours 05/18/12 1457 05/21/12 0759   05/16/12 1000   ciprofloxacin (CIPRO) IVPB 200 mg  Status:  Discontinued        200 mg 100 mL/hr over 60 Minutes Intravenous Every 24 hours 05/16/12 0846 05/18/12 1457   05/16/12 0800   metroNIDAZOLE (FLAGYL) IVPB 500 mg  Status:  Discontinued        500 mg 100 mL/hr over 60 Minutes Intravenous Every 8 hours 05/16/12 0735 05/21/12 0759   05/14/12 0600   ceFAZolin (ANCEF) IVPB 2 g/50 mL premix        2 g 100 mL/hr over 30 Minutes Intravenous On call to O.R. 05/13/12 1424  05/14/12 1407          Assessment 1. POD#8-lap chole: bile leak post-op, s/p ERCP with stenting of the CBD, now with post-operative ileus.  Also increasing WBC  --check CT abdomen today   --keep npo  --leave NGT to suction until having some bowel function  --TNA  --recheck cbc in am  WHITE, ELIZABETH 05/22/2012

## 2012-05-22 NOTE — Progress Notes (Signed)
Physical Therapy Treatment Patient Details Name: TSURUE WARGEL MRN: [id number] DOB: [birth year] Today's Date: 05/22/2012 Time: 4098-1191 PT Time Calculation (min): 31 min  PT Assessment / Plan / Recommendation Comments on Treatment Session  improving with gait/mobility each session.  Activity tolerace notably improved.    Follow Up Recommendations  Home health PT;Supervision - Intermittent     Does the patient have the potential to tolerate intense rehabilitation     Barriers to Discharge        Equipment Recommendations  Rolling walker with 5" wheels    Recommendations for Other Services    Frequency Min 3X/week   Plan Discharge plan remains appropriate;Frequency remains appropriate    Precautions / Restrictions Precautions Precautions: Fall Restrictions Weight Bearing Restrictions: No   Pertinent Vitals/Pain     Mobility  Bed Mobility Bed Mobility: Supine to Sit;Sitting - Scoot to Edge of Bed Supine to Sit: 4: Min assist;HOB elevated Sitting - Scoot to Edge of Bed: 5: Supervision Details for Bed Mobility Assistance: Assist for trunk to translate anterior with cues for sequence and safety. Transfers Transfers: Sit to Stand;Stand to Sit Sit to Stand: 4: Min assist;With upper extremity assist;From bed Stand to Sit: 4: Min guard;With upper extremity assist;To chair/3-in-1 Details for Transfer Assistance: vc's for hand placement; steadying/lifting assist to stand Ambulation/Gait Ambulation/Gait Assistance: 4: Min guard Ambulation Distance (Feet): 75 Feet Assistive device: Rolling walker Ambulation/Gait Assistance Details: occ. mild unsteadiness in turns, but generally safe with RW Gait Pattern: Step-through pattern;Decreased stride length;Trunk flexed Gait velocity: decreased Stairs: No Wheelchair Mobility Wheelchair Mobility: No    Exercises     PT Diagnosis:    PT Problem List:   PT Treatment Interventions:     PT Goals Acute Rehab PT Goals Time For  Goal Achievement: 06/05/12 Potential to Achieve Goals: Good PT Goal: Supine/Side to Sit - Progress: Progressing toward goal PT Goal: Sit to Stand - Progress: Progressing toward goal PT Goal: Stand to Sit - Progress: Progressing toward goal PT Goal: Ambulate - Progress: Progressing toward goal  Visit Information  Last PT Received On: 05/22/12 Assistance Needed: +1    Subjective Data  Subjective: I'd love to go, let's go sit in the bathroom a bit too.   Cognition  Overall Cognitive Status: Appears within functional limits for tasks assessed/performed Arousal/Alertness: Awake/alert Orientation Level: Appears intact for tasks assessed Behavior During Session: [facility] for tasks performed    Balance  Balance Balance Assessed: No  End of Session PT - End of Session Equipment Utilized During Treatment: Oxygen Activity Tolerance: Patient tolerated treatment well Patient left: with call bell/phone within reach;in chair Nurse Communication: Mobility status   GP     Destyn Parfitt, Eliseo Gum 05/22/2012, 5:56 PM 05/22/2012   [place], PT [phone] (pager)

## 2012-05-22 NOTE — Progress Notes (Signed)
Subjective:  Still NPO  Objective:  Vital Signs in the last 24 hours: Temp:  [97.5 F (36.4 C)-98.3 F (36.8 C)] 98.2 F (36.8 C) (10/31 0700) Pulse Rate:  [83-104] 83  (10/31 0500) Resp:  [17-31] 17  (10/31 0500) BP: (153-164)/(74-85) 159/81 mmHg (10/31 0500) SpO2:  [97 %-99 %] 98 % (10/31 0500)  Intake/Output from previous day:  Intake/Output Summary (Last 24 hours) at 05/22/12 1033 Last data filed at 05/22/12 0700  Gross per 24 hour  Intake 1500.14 ml  Output   2050 ml  Net -549.86 ml    Physical Exam: General appearance: alert, cooperative and no distress Lungs: clear to auscultation bilaterally Heart: irregularly irregular rhythm   Rate: 84  Rhythm: atrial fibrillation  Lab Results:  Basename 05/22/12 0800 05/21/12 0830  WBC 31.4* 26.4*  HGB 9.4* 9.2*  PLT 392 374    Basename 05/22/12 0500 05/21/12 0500  NA 140 141  K 4.2 3.4*  CL 106 105  CO2 29 27  GLUCOSE 247* 182*  BUN 16 16  CREATININE 0.86 0.89   No results found for this basename: TROPONINI:2,CK,MB:2 in the last 72 hours Hepatic Function Panel  Basename 05/22/12 0500  PROT 6.1  ALBUMIN 2.3*  AST 12  ALT 6  ALKPHOS 76  BILITOT 0.7  BILIDIR --  IBILI --   No results found for this basename: CHOL in the last 72 hours No results found for this basename: INR in the last 72 hours  Imaging: Imaging results have been reviewed  Cardiac Studies:  Assessment/Plan:   Principal Problem:  *Cholecystitis chronic, acute, S/P lap cholecystectomy 05/14/13 complicated by bile leak, S/P ERCP Active Problems:  CHF, past NICM with an EF of 30%, most recent echo 2011 showed EF to be45- 50%  CAF not felt to be a Coumadin candidate in the past, rate currently controlled  HTN (hypertension)  Ileus post op. still NPO  History of colon cancer, stage III  Thoracic aortic aneurysm, 4.8cm 2011  Acute on chronic renal insufficiency, improved  Normal coronary arteries, 2008  Diabetes mellitus  Respiratory distress post op secondary to rapid AF   Plan- CAF with rate control on IV Diltiazem, still NPO. B/P is a little high, prn Hydralazine and Normodyne ordered. Consider DVT dose Lovenox. She was not a Coumadin candidate prior to admission.   Corine Shelter PA-C 05/22/2012, 10:33 AM    I have seen and examined the patient along with Corine Shelter PA-C.  I have reviewed the chart, notes and new data.  I agree with PA's note.  Adequate rate control and generally satisfactory BP (considering her discomfort). No overt signs or symptoms of CHF. Pharmacological therapy limited by NPO status.  Thurmon Fair, MD, [facility] [phone] 05/22/2012, 11:34 AM

## 2012-05-22 NOTE — Progress Notes (Signed)
PARENTERAL NUTRITION CONSULT NOTE - FOLLOW UP  Pharmacy Consult: TPN Indication:  SBO  Allergies  Allergen Reactions  . Iohexol      Code: VOM, Desc: PT REPORTS VOMITING W/ IVP DYE- ARS 12/26/08, Onset Date: [id number]     Patient Measurements: Height: 5\' 10"  (177.8 cm) Weight: 185 lb 3 oz (84 kg) IBW/kg (Calculated) : 68.5  Adjusted Body Weight: 74 kg  Vital Signs: Temp: 98.2 F (36.8 C) (10/31 0454) Temp src: Oral (10/31 0454) BP: 159/81 mmHg (10/31 0500) Pulse Rate: 83  (10/31 0500) Intake/Output from previous day: 10/30 0701 - 10/31 0700 In: 1975.1 [I.V.:715; IV Piggyback:350; TPN:910.1] Out: 2350 [Urine:1950; Emesis/NG output:400]  Labs:  [facility] 05/22/12 0800 05/21/12 0830 05/20/12 0850  WBC 31.4* 26.4* 21.1*  HGB 9.4* 9.2* 9.2*  HCT 26.5* 26.6* 26.7*  PLT 392 374 335  APTT -- -- --  INR -- -- --     Basename 05/22/12 0500 05/21/12 0500 05/20/12 0850  NA 140 141 142  K 4.2 3.4* 4.0  CL 106 105 106  CO2 29 27 29   GLUCOSE 247* 182* 216*  BUN 16 16 18   CREATININE 0.86 0.89 1.04  LABCREA -- -- --  CREAT24HRUR -- -- --  CALCIUM 8.7 8.5 8.5  MG 1.9 1.8 1.8  PHOS 2.4 2.6 2.1*  PROT 6.1 -- --  ALBUMIN 2.3* -- --  AST 12 -- --  ALT 6 -- --  ALKPHOS 76 -- --  BILITOT 0.7 -- --  BILIDIR -- -- --  IBILI -- -- --  PREALBUMIN -- -- --  TRIG -- -- --  CHOLHDL -- -- --  CHOL -- -- --   Estimated Creatinine Clearance: 62.6 ml/min (by C-G formula based on Cr of 0.86).    Basename 05/22/12 0800 05/22/12 0452 05/22/12 0024  GLUCAP 225* 194* 234*    Insulin Requirements in the past 12 hours:  5 units from SSI + 55 units regular insulin in TPN  Current Nutrition:  TPN, NPO  Nutritional Goals:  1700-1900 kCal, 90-100 grams of protein per day  Assessment:  GI: s/p lap cholecystectomy 10/23 and ERCP with stent placement on 10/26. Has NG to suction in place for abdominal distention, with decreasing output. Noted plans to re-CT abd and continue NGT for  now. Endo: hx hypothyroid on Synthroid.  hx DM2 on metformin and glipizide PTA. CBGs trended up post TPN initiation.  CBGs remain elevated - 194-234.   Lytes: K is nml (goal ~4 with ileus), MG nml, but slightly below goal of 2 and Phos is nml, but below goal of 4 in setting of ileus. Renal: Had some ARF post-op d/t hemodynamic renal injury, now resolved.  SCr 0.86, good UOP. Noted continues on D51/2NS with 40 KCL at 50 ml/hr. Acid-base status ok. Pulm: 3L ==> 2L [place] Cards: hx CHF (EF 45-50%) / Afib / HTN - on diltiazem drip, not an anticoagulation candidate d/t hematoma and possible need for repeat surgery. Not on warfarin PTA either.  BP mildly elevated, HR within goal Hepatobil: LFTs WNL. Chol and Trig nml. Prealbumin is low in setting of post-op and ongoing inflammation. Neuro: no issues ID: Cipro/Flagyl changed to Zosyn (d#1) for increased intraabdominal coverage. Cultures remain negative. Ongoing leukocytosis, pt remains afebrile. Best Practices: SCD for VTE prophylaxis, IV PPI TPN Access: PICC placed 10/27 TPN day#: 4  Plan:  - Keep Clinimix E 5/15 at 60 ml/hr (goal 83 ml/hr).  Advance to goal rate when CBGs are more stable. TPN  at goal rate will provide 100% goal protein and 92% of goal (avg) Kcals. - Supplement lipids, multivitamins, and trace elements only on M-W-F due to shortages - Magnesium sulfate 2gm IV x 1 again today - of sodium glycerophos - Increase insulin to 60 units per bag, change SSI to resistant scale - Will change IVF to NS at 40 ml/hr - Check BMET, Mag, Phos with AM labs - Follow-up CBGs and insulin requirements  Thanks, Casin Federici K. Allena Katz, PharmD, BCPS.  Clinical Pharmacist Pager [phone]. 05/22/2012 8:40 AM

## 2012-05-22 NOTE — Progress Notes (Signed)
Agree with PA-White's PN -CT today -con't abx -TNA

## 2012-05-23 ENCOUNTER — Inpatient Hospital Stay (HOSPITAL_COMMUNITY): Payer: Medicare Other

## 2012-05-23 LAB — GLUCOSE, CAPILLARY
Glucose-Capillary: 109 mg/dL — ABNORMAL HIGH (ref 70–99)
Glucose-Capillary: 142 mg/dL — ABNORMAL HIGH (ref 70–99)
Glucose-Capillary: 155 mg/dL — ABNORMAL HIGH (ref 70–99)
Glucose-Capillary: 169 mg/dL — ABNORMAL HIGH (ref 70–99)
Glucose-Capillary: 169 mg/dL — ABNORMAL HIGH (ref 70–99)
Glucose-Capillary: 188 mg/dL — ABNORMAL HIGH (ref 70–99)
Glucose-Capillary: 192 mg/dL — ABNORMAL HIGH (ref 70–99)

## 2012-05-23 LAB — BODY FLUID CELL COUNT WITH DIFFERENTIAL
Eos, Fluid: 0 %
Lymphs, Fluid: 8 %
Monocyte-Macrophage-Serous Fluid: 72 % (ref 50–90)
Neutrophil Count, Fluid: 20 % (ref 0–25)
Total Nucleated Cell Count, Fluid: 3900 cu mm — ABNORMAL HIGH (ref 0–1000)

## 2012-05-23 LAB — URINALYSIS, ROUTINE W REFLEX MICROSCOPIC
Glucose, UA: 100 mg/dL — AB
Hgb urine dipstick: NEGATIVE
Ketones, ur: NEGATIVE mg/dL
Protein, ur: NEGATIVE mg/dL
Specific Gravity, Urine: 1.012 (ref 1.005–1.030)
pH: 6 (ref 5.0–8.0)

## 2012-05-23 LAB — CBC
Hemoglobin: 8.9 g/dL — ABNORMAL LOW (ref 12.0–15.0)
MCH: 28.3 pg (ref 26.0–34.0)
MCHC: 34.9 g/dL (ref 30.0–36.0)
Platelets: 391 10*3/uL (ref 150–400)
RBC: 3.14 MIL/uL — ABNORMAL LOW (ref 3.87–5.11)

## 2012-05-23 LAB — PROTEIN, BODY FLUID: Total protein, fluid: 2.2 g/dL

## 2012-05-23 LAB — BASIC METABOLIC PANEL
BUN: 17 mg/dL (ref 6–23)
CO2: 27 mEq/L (ref 19–32)
Calcium: 8.6 mg/dL (ref 8.4–10.5)
GFR calc non Af Amer: 77 mL/min — ABNORMAL LOW (ref >90)
Glucose, Bld: 204 mg/dL — ABNORMAL HIGH (ref 70–99)

## 2012-05-23 MED ORDER — BISACODYL 5 MG PO TBEC: 5.0000 mg | DELAYED_RELEASE_TABLET | Freq: Every day | ORAL | Status: DC | PRN

## 2012-05-23 MED ORDER — TRACE MINERALS CR-CU-F-FE-I-MN-MO-SE-ZN IV SOLN
INTRAVENOUS | Status: AC
  Administered 2012-05-23: 18:00:00 via INTRAVENOUS
  Filled 2012-05-23: qty 2000

## 2012-05-23 MED ORDER — FUROSEMIDE 10 MG/ML IJ SOLN
INTRAMUSCULAR | Status: AC
  Administered 2012-05-23: 20 mg via INTRAVENOUS
  Filled 2012-05-23: qty 4

## 2012-05-23 MED ORDER — SODIUM CHLORIDE 0.9 % IV SOLN: INTRAVENOUS | Status: DC

## 2012-05-23 MED ORDER — POTASSIUM CHLORIDE 10 MEQ/50ML IV SOLN
10.0000 meq | INTRAVENOUS | Status: AC
  Administered 2012-05-23 (×4): 10 meq via INTRAVENOUS
  Filled 2012-05-23: qty 150
  Filled 2012-05-23: qty 50

## 2012-05-23 MED ORDER — FUROSEMIDE 10 MG/ML IJ SOLN
20.0000 mg | Freq: Once | INTRAMUSCULAR | Status: AC
  Administered 2012-05-23: 20 mg via INTRAVENOUS

## 2012-05-23 MED ORDER — MAGNESIUM SULFATE 40 MG/ML IJ SOLN
2.0000 g | Freq: Once | INTRAMUSCULAR | Status: AC
  Administered 2012-05-23: 2 g via INTRAVENOUS
  Filled 2012-05-23 (×2): qty 50

## 2012-05-23 MED ORDER — FAT EMULSION 20 % IV EMUL
250.0000 mL | INTRAVENOUS | Status: AC
  Administered 2012-05-23: 250 mL via INTRAVENOUS
  Filled 2012-05-23: qty 250

## 2012-05-23 MED ORDER — SODIUM GLYCEROPHOSPHATE 1 MMOLE/ML IV SOLN
10.0000 mmol | Freq: Once | INTRAVENOUS | Status: AC
  Administered 2012-05-23: 10 mmol via INTRAVENOUS
  Filled 2012-05-23 (×2): qty 10

## 2012-05-23 NOTE — Progress Notes (Signed)
Nutrition Follow-up  Intervention:  Continue current TPN to meet nutrition goal.  Assessment:   NGT in place.  No BM yet.  S/P right thoracentesis earlier today.    Patient is receiving TPN with Clinimix E 5/15 @ 60 ml/hr, increasing to 80 ml/h today.  Lipids (20% IVFE @ 10 ml/hr), multivitamins, and trace elements are provided 3 times weekly (MWF) due to national backorder.  Provides 1569 kcal and 96 grams protein daily (based on weekly average).  Meets 92% minimum estimated kcal and 100% minimum estimated protein needs.  Diet Order:  NPO  Meds: Scheduled Meds:   . furosemide  20 mg Intravenous Once  . insulin aspart  0-20 Units Subcutaneous TID WC  . insulin glargine  10 Units Subcutaneous BID  . levothyroxine  26 mcg Intravenous QAC breakfast  . magnesium sulfate 1 - 4 g bolus IVPB  2 g Intravenous Once  . pantoprazole (PROTONIX) IV  40 mg Intravenous Q12H  . piperacillin-tazobactam (ZOSYN)  IV  3.375 g Intravenous Q8H  . potassium chloride  10 mEq Intravenous Q1 Hr x 4  . sodium chloride  10-40 mL Intracatheter Q12H  . sodium glycerophosphate 0.9% NaCl IVPB  10 mmol Intravenous Once  . sodium glycerophosphate 0.9% NaCl IVPB  10 mmol Intravenous Once   Continuous Infusions:   . sodium chloride 40 mL/hr at 05/23/12 0600  . sodium chloride    . diltiazem (CARDIZEM) infusion 15 mg/hr (05/23/12 1343)  . TPN (CLINIMIX) +/- additives 60 mL/hr at 05/22/12 0700   And  . fat emulsion 250 mL (05/22/12 0600)  . TPN (CLINIMIX) +/- additives     And  . fat emulsion    . TPN (CLINIMIX) +/- additives 60 mL/hr at 05/23/12 0600   PRN Meds:.bisacodyl, bisacodyl, diphenhydrAMINE, diphenhydrAMINE, hydrALAZINE, labetalol, morphine injection, ondansetron (ZOFRAN) IV, ondansetron, phenol, promethazine, sodium chloride   CMP     Component Value Date/Time   NA 140 05/23/2012 0459   K 3.6 05/23/2012 0459   CL 105 05/23/2012 0459   CO2 27 05/23/2012 0459   GLUCOSE 204* 05/23/2012 0459   BUN 17  05/23/2012 0459   CREATININE 0.78 05/23/2012 0459   CALCIUM 8.6 05/23/2012 0459   PROT 6.1 05/22/2012 0500   ALBUMIN 2.3* 05/22/2012 0500   AST 12 05/22/2012 0500   ALT 6 05/22/2012 0500   ALKPHOS 76 05/22/2012 0500   BILITOT 0.7 05/22/2012 0500   GFRNONAA 77* 05/23/2012 0459   GFRAA 90* 05/23/2012 0459    CBG (last 3)   Basename 05/23/12 1331 05/23/12 0724 05/23/12 0407  GLUCAP 169* 192* 188*     Intake/Output Summary (Last 24 hours) at 05/23/12 1348 Last data filed at 05/23/12 0700  Gross per 24 hour  Intake    677 ml  Output   1475 ml  Net   -798 ml    Weight Status:  84 kg (10/25) no new weight available  Estimated needs:  1700-1900 kcals, 95-110 gm protein, 1.8-2 liters fluid daily  Nutrition Dx:  Inadequate oral intake related to altered GI function as evidenced by NPO status, ongoing.  Goal:  Intake to meet >90% of estimated nutrition needs, met.  Monitor:  TPN tolerance/adequacy, weight trend, labs, I/O, ability to begin PO diet.   Joaquin Courts, RD, LDN, CNSC Pager# [phone] After Hours Pager# [phone]

## 2012-05-23 NOTE — Progress Notes (Signed)
Patient ID: Nicole [name], female   DOB: 1930-11-23, 78 y.o.   MRN: [id number]  6 Days Post-Op  Subjective: Pt reports soreness in abd, Still feels bloated.  Some trouble breathing, denies cp, bm, flatus.  Objective: Vital signs in last 24 hours: Temp:  [97.5 F (36.4 C)-98.5 F (36.9 C)] 98.3 F (36.8 C) (11/01 0420) Pulse Rate:  [83-101] 85  (11/01 0420) Resp:  [19-28] 26  (11/01 0420) BP: (145-165)/(81-91) 146/87 mmHg (11/01 0420) SpO2:  [96 %-99 %] 96 % (11/01 0420) Last BM Date: 05/10/12  Intake/Output from previous day: 10/31 0701 - 11/01 0700 In: 1489.4 [I.V.:565; IV Piggyback:210; TPN:714.4] Out: 1400 [Urine:1350; Emesis/NG output:50] Intake/Output this shift:    PE: Abd- seems more distended, no bowel sounds, incisions c/d/i, mildly tender,  Lab Results:   Basename 05/22/12 0800 05/21/12 0830  WBC 31.4* 26.4*  HGB 9.4* 9.2*  HCT 26.5* 26.6*  PLT 392 374   BMET  Basename 05/23/12 0459 05/22/12 0500  NA 140 140  K 3.6 4.2  CL 105 106  CO2 27 29  GLUCOSE 204* 247*  BUN 17 16  CREATININE 0.78 0.86  CALCIUM 8.6 8.7   PT/INR No results found for this basename: LABPROT:2,INR:2 in the last 72 hours Comprehensive Metabolic Panel:    Component Value Date/Time   NA 140 05/23/2012 0459   K 3.6 05/23/2012 0459   CL 105 05/23/2012 0459   CO2 27 05/23/2012 0459   BUN 17 05/23/2012 0459   CREATININE 0.78 05/23/2012 0459   GLUCOSE 204* 05/23/2012 0459   CALCIUM 8.6 05/23/2012 0459   AST 12 05/22/2012 0500   ALT 6 05/22/2012 0500   ALKPHOS 76 05/22/2012 0500   BILITOT 0.7 05/22/2012 0500   PROT 6.1 05/22/2012 0500   ALBUMIN 2.3* 05/22/2012 0500     Studies/Results: Ct Abdomen Wo Contrast  05/22/2012    *RADIOLOGY REPORT*  Clinical Data:  Abdominal pain and leukocytosis. Bile leak.  CT ABDOMEN WITHOUT CONTRAST  Technique:  Multidetector CT imaging of the abdomen was performed following the standard protocol without IV contrast.  Comparison:  CT scan dated  05/16/2012  Findings:  There is persistent dilatation of proximal small bowel consistent with a partial small bowel obstruction.  The colon is not dilated.  Biliary stent has been placed.  There is some reflux of the oral contrast up the stent into the common bile duct.  There are focal areas of loculated hemorrhage along the inferior margin of the right and left lobes of the liver, not significantly changed since the prior study.  The hemorrhage and ascites around the right lobe of the liver laterally and inferiorly have almost resolved. No bile duct dilatation.  The bilateral pleural effusions have increased, right greater than left, with increased compressive atelectasis of the lower lobes.  No other change since the prior exam.  IMPRESSION:  1.  Interval insertion of biliary stent.  Almost complete resolution of the fluid adjacent to the right lobe of the liver. 2.  Stable perihepatic hemorrhage adjacent to the inferior aspect of the left and right lobes of the liver. 3.  Increasing bilateral pleural effusions with increased atelectasis at both lung bases. 4.  Persistent partial small bowel obstruction.   Original Report Authenticated By: Francene Boyers, M.D.     Anti-infectives: Anti-infectives     Start     Dose/Rate Route Frequency Ordered Stop   05/21/12 0900  piperacillin-tazobactam (ZOSYN) IVPB 3.375 g       3.375  g 12.5 mL/hr over 240 Minutes Intravenous Every 8 hours 05/21/12 0827     05/18/12 2200   ciprofloxacin (CIPRO) IVPB 400 mg  Status:  Discontinued        400 mg 200 mL/hr over 60 Minutes Intravenous Every 12 hours 05/18/12 1457 05/21/12 0759   05/16/12 1000   ciprofloxacin (CIPRO) IVPB 200 mg  Status:  Discontinued        200 mg 100 mL/hr over 60 Minutes Intravenous Every 24 hours 05/16/12 0846 05/18/12 1457   05/16/12 0800   metroNIDAZOLE (FLAGYL) IVPB 500 mg  Status:  Discontinued        500 mg 100 mL/hr over 60 Minutes Intravenous Every 8 hours 05/16/12 0735 05/21/12 0759     05/14/12 0600   ceFAZolin (ANCEF) IVPB 2 g/50 mL premix        2 g 100 mL/hr over 30 Minutes Intravenous On call to O.R. 05/13/12 1424 05/14/12 1407          Assessment 1. POD#9-lap chole: bile leak post-op, s/p ERCP with stenting of the CBD, now with post-operative ileus.  Also increasing WBC up to 31 yesterday, CT showed improved fluid collection but persistent SBO, also moderate sized bilateral pleural effusions.    --await CBC this am   --keep npo  --leave NGT to suction until having some bowel function  --TNA  --On zosyn  Will discuss with Dr. Derrell Lolling.  Esiquio Boesen 05/23/2012

## 2012-05-23 NOTE — Progress Notes (Signed)
PARENTERAL NUTRITION CONSULT NOTE - FOLLOW UP  Pharmacy Consult: TPN Indication:  SBO  Allergies  Allergen Reactions  . Iohexol      Code: VOM, Desc: PT REPORTS VOMITING W/ IVP DYE- ARS 12/26/08, Onset Date: 16109604     Patient Measurements: Height: 5\' 10"  (177.8 cm) Weight: 185 lb 3 oz (84 kg) IBW/kg (Calculated) : 68.5  Adjusted Body Weight: 74 kg  Vital Signs: Temp: 98 F (36.7 C) (11/01 0700) Temp src: Oral (11/01 0700) BP: 146/87 mmHg (11/01 0420) Pulse Rate: 85  (11/01 0420) Intake/Output from previous day: 10/31 0701 - 11/01 0700 In: 1489.4 [I.V.:565; IV Piggyback:210; TPN:714.4] Out: 1700 [Urine:1650; Emesis/NG output:50]  Labs:  [facility] 05/23/12 0854 05/22/12 0800 05/21/12 0830  WBC 23.4* 31.4* 26.4*  HGB 8.9* 9.4* 9.2*  HCT 25.5* 26.5* 26.6*  PLT 391 392 374  APTT -- -- --  INR -- -- --     Basename 05/23/12 0459 05/22/12 0500 05/21/12 0500  NA 140 140 141  K 3.6 4.2 3.4*  CL 105 106 105  CO2 27 29 27   GLUCOSE 204* 247* 182*  BUN 17 16 16   CREATININE 0.78 0.86 0.89  LABCREA -- -- --  CREAT24HRUR -- -- --  CALCIUM 8.6 8.7 8.5  MG 1.8 1.9 1.8  PHOS 2.8 2.4 2.6  PROT -- 6.1 --  ALBUMIN -- 2.3* --  AST -- 12 --  ALT -- 6 --  ALKPHOS -- 76 --  BILITOT -- 0.7 --  BILIDIR -- -- --  IBILI -- -- --  PREALBUMIN -- -- --  TRIG -- -- --  CHOLHDL -- -- --  CHOL -- -- --   Estimated Creatinine Clearance: 67.2 ml/min (by C-G formula based on Cr of 0.78).    Basename 05/23/12 0724 05/22/12 2358 05/22/12 2009  GLUCAP 192* 169* 174*    Insulin Requirements in the past 12 hours:  17 units from SSI + 60 units regular insulin in TPN  Current Nutrition:  TPN, NPO  Nutritional Goals:  1700-1900 kCal, 90-100 grams of protein per day  Assessment:  GI: s/p lap cholecystectomy 10/23 and ERCP with stent placement on 10/26. Has NG to suction in place for abdominal distention, with decreasing output. CT shows persistent SBO. Endo: hx hypothyroid on  Synthroid.  hx DM2 on metformin and glipizide PTA. CBGs are improved but remain elevated - 169-197.  Note Lantus 10 units BID was added 10/31. Lytes: K has decreased (goal ~4 with ileus), MG nml, but slightly below goal of 2 and Phos is nml, but below goal of 4 in setting of ileus. Renal: Had some ARF post-op d/t hemodynamic renal injury, now resolved.  SCr 0.78, good UOP. On NS at 40 ml/hr. Pulm: 3L [place] Cards: hx CHF (EF 45-50%) / Afib / HTN - on diltiazem drip, not an anticoagulation candidate d/t hematoma and possible need for repeat surgery. Not on warfarin PTA either.  BP mildly elevated, HR within goal Hepatobil: LFTs WNL. Chol and Trig nml. Prealbumin is low in setting of post-op and ongoing inflammation. Neuro: no issues ID: Cipro/Flagyl changed to Zosyn (d#3) for increased intraabdominal coverage. Cultures remain negative. Ongoing leukocytosis, pt remains afebrile. Best Practices: SCD for VTE prophylaxis, IV PPI TPN Access: PICC placed 10/27 TPN day#: 5  Plan:  - Advance Clinimix E 5/15 to goal rate of 80 ml/hr. TPN at goal rate will provide 100% goal protein and 92% of goal (avg) Kcals. - Supplement lipids, multivitamins, and trace elements only on  M-W-F due to shortages - Magnesium sulfate 2gm IV x 1 again today - of sodium glycerophos - KCl x 4 runs - Increase insulin to 80 units per bag (proportional to the 60 units she is currently receiving), continue SSI at resistant scale and Lantus - Decrease IVF to [facility] - Check CMET, Mag, Phos with AM labs - Follow-up CBGs and insulin requirements  Estella Husk, Pharm.D., BCPS Clinical Pharmacist  Phone [phone] Pager [phone] 05/23/2012, 10:20 AM

## 2012-05-23 NOTE — Progress Notes (Signed)
[facility] [place] TEAM 1 - Stepdown/ICU TEAM  Name: Nicole [name] Date of Birth: February 18, 1931 Admit Date: 05/12/2012 ZOX:WRUE, [name], DO  Brief Narrative: 78 year old female who was admitted on 05/12/2012 with 2-3 days of persistent right upper quadrant abdominal pain worse after eating. She had been evaluated in the emergency department 24 hours before admission and ultrasound revealed symptomatic cholelithiasis. She had been evaluated by surgery that time and plans were to proceed with elective cholecystectomy after patient stopped her Plavix and aspirin for one week. Unfortunately she returned to the ER because of uncontrolled pain with vomiting. sHe subsequently underwent a laparoscopic cholecystectomy on 05/14/2012. Postoperatively She developed severe abdominal pain associated with nausea and vomiting. HIDA scan revealed bile leak. In the interim she developed atrial fibrillation with RVR and was subsequentLY started on Cardizem drip. CT scan performed demonstrated small bowel obstruction that warranted NG tube placement and surgical consultation. She has subsequently undergone ERCP with stent placement on 05/17/2012.  Assessment/Plan:  Abdominal Distention 2/2 post operative partial SBO/ileus - Appreciate surgical team assistance -c/w NPO status, NG tube, and supportive care -Abdominal distention is contributing to mechanical respiratory problems i.e. limited inspiration due to elevated diaphragm from abdominal distention  -Bile Leak -following Lap Cholecystectomy 10/23 -S/P ERCP with Stent placement 10/26 -Initially was tx'd with empiric Cipro and Flagyl - WBC counts rising so surgery dc'd  In favor of Zosyn   -CT abdomen and pelvis 05/22/2012 reveals near resolution of fluid collection near the liver and no evidence of abscess or other findings to explain leukocytosis  Abdominal Wall hematoma -H/H stable at 9.2 -monitor H&H  daily  ARF -Resolved -appreciate Renal  eval -suspicion for hemodynamic mediated renal injury-as patient was very briefly hypotensive on 10/24-retrospectively-hypotension probably was a result from developing abd wall hematoma/?intra-abdominal pathology  Afib with RVR -c/w cardizem gtt- currently at 15 mg/hr -not a candidate for anticoagulation-given hematoma/potential for repeart surgery, more importantly has been deemed not a be candidate for anticoagulation by her cardiologist in the past as well-?non compliance - plan to convert to oral regimen once PSBO resolved  Acute hypoxic Resp Failure due to : A) compressive atelectasis/mechanical obstruction secondary to abdominal distention -stable with just 2-3 L of O2 -suspect 2/2 to abdominal pain as well as mechanical due to abdominal distention- this has led to atelectasis -may also have a degree of edema due to AF/RVR and third spacing from abdominal pathology -continue IS/mobilization  B) bilateral pleural effusion -Status post thoracentesis today right lung with 470 cc of dark bloody fluid obtained -Fluid sent for culture and cytology  Persistent leukocytosis -Likely related to pulmonary processes with compressive atelectasis and bloody effusion -As a precaution we'll repeat urinalysis with culture-90 2 repeat blood cultures as well  Cholecystitis -s/p Lap cholecystectomy on 10/23 -unfortunately complicated by abdominal wall hematoma and bile leak-see above  Hypokalemia -2/2 to GI loss via NG suction -replete and recheck - goal is K+ of 4.0 or >  Hypernatremia Resolved w/ addition of free water  DM-uncontrolled -holding oral agents -CBGs better controlled over the past 24 hours with readings between 160 and 190 -CBG's main elevated greater than due to TNA and despite resistant sliding scale insulin -Continue twice a day Lantus 10 units with first dose now -Likely underlying infectious process also contributing to poorly controlled diabetes  CHF (systolic EF 45%  and diastolic dysfunction-grade 1) -compensated -cards following -moderate TR due to pulmonary HTN  HTN -monitor and restart IV meds as needed  Thoracic Aortic Aneurysm -outpatient follow up  H/O Stage III colon Ca -outpatient follow up with Oncology  Disposition: Remain on SDU (cardizem gtt) DVT Prophylaxis: scd's Code Status: Full code   Procedures:  Laparoscopic Cholecystectomy 05/14/12  ERCP 05/17/12  Right thoracentesis per interventional radiology 05/23/2012  CONSULTS: Cardiology  Nephrology GI  General Surgery Interventional radiology  Subjective: Continues with shortness of breath especially when sitting upright. Has some abdominal pain with palpation but no persistent abdominal pain. Still passing flatus but no reports of BM. Is becoming discouraged bilateral progress. Family member at bedside and updated on status at patient's request  PHYSICAL EXAM: Vital signs in last 24 hours: Filed Vitals:   05/23/12 0420 05/23/12 0700 05/23/12 1130 05/23/12 1206  BP: 146/87  164/82 131/67  Pulse: 85     Temp: 98.3 F (36.8 C) 98 F (36.7 C)    TempSrc: Oral Oral    Resp: 26     Height:      Weight:      SpO2: 96%  99% 99%    Weight change:  Body mass index is 26.57 kg/(m^2).   Gen Exam: Awake and alert with clear speech.   Neck: Supple, No JVD.   Chest: Coarse and diminished in the bases, no wheezing, nasal cannula oxygen, tachypnea especially with attempts to verbalize  CVS: S1 S2 Regular, tachycardia.  Abdomen: Remain distended, slightly tender in the epigastric/RUQ region, BS decreased - NGT to LWS-dark green bilious returns Extremities: no edema, lower extremities warm to touch. Neurologic: Non Focal.   Skin: No Rash.   Wounds: N/A.    Intake/Output from previous day:  Intake/Output Summary (Last 24 hours) at 05/23/12 1407 Last data filed at 05/23/12 0700  Gross per 24 hour  Intake  551.6 ml  Output   1475 ml  Net -923.4 ml    LAB  RESULTS: CBC  Lab 05/23/12 0854 05/22/12 0800 05/21/12 0830 05/20/12 0850 05/19/12 0415  WBC 23.4* 31.4* 26.4* 21.1* 14.4*  HGB 8.9* 9.4* 9.2* 9.2* 8.7*  HCT 25.5* 26.5* 26.6* 26.7* 24.8*  PLT 391 392 374 335 285  MCV 81.2 81.5 81.8 82.4 82.9  MCH 28.3 28.9 28.3 28.4 29.1  MCHC 34.9 35.5 34.6 34.5 35.1  RDW 15.7* 15.6* 15.6* 15.4 15.3  LYMPHSABS -- -- -- -- 0.8  MONOABS -- -- -- -- 2.6*  EOSABS -- -- -- -- 0.1  BASOSABS -- -- -- -- 0.0  BANDABS -- -- -- -- --    Chemistries   Lab 05/23/12 0459 05/22/12 0500 05/21/12 0500 05/20/12 0850 05/20/12 0430  NA 140 140 141 142 141  K 3.6 4.2 3.4* 4.0 5.1  CL 105 106 105 106 106  CO2 27 29 27 29 30   GLUCOSE 204* 247* 182* 216* 365*  BUN 17 16 16 18 16   CREATININE 0.78 0.86 0.89 1.04 0.97  CALCIUM 8.6 8.7 8.5 8.5 8.3*  MG 1.8 1.9 1.8 1.8 1.6    CBG:  Lab 05/23/12 1331 05/23/12 0724 05/23/12 0407 05/22/12 2358 05/22/12 2009  GLUCAP 169* 192* 188* 169* 174*   No results found for this basename: CHOL:2,HDL:2,LDLCALC:2,TRIG:2,CHOLHDL:2,LDLDIRECT:2 in the last 72 hours MICROBIOLOGY: Recent Results (from the past 240 hour(s))  SURGICAL PCR SCREEN     Status: Abnormal   Collection Time   05/14/12  4:10 AM      Component Value Range Status Comment   MRSA, PCR NEGATIVE  NEGATIVE Final    Staphylococcus aureus POSITIVE (*) NEGATIVE Final   URINE  CULTURE     Status: Normal   Collection Time   05/16/12  1:50 AM      Component Value Range Status Comment   Specimen Description URINE, CATHETERIZED   Final    Special Requests NONE   Final    Culture  Setup Time 05/16/2012 02:27   Final    Colony Count NO GROWTH   Final    Culture NO GROWTH   Final    Report Status 05/16/2012 FINAL   Final    MEDICATIONS: Reviewed by M.D.  Junious Silk L.,ANP  [facility] Pager:[phone]  If 7PM-7AM, please contact night-coverage [email or web address] Password [email or web address] 05/23/2012, 2:07 PM   LOS: 11 days   I have personally examined this  patient and reviewed the entire database. I have reviewed the above note, made any necessary editorial changes, and agree with its content.  Lonia Blood, MD [facility]

## 2012-05-23 NOTE — Procedures (Signed)
Request for possible bilateral thoracentesis for effusions Small amt fluid on (L)side, moderate amount dense fluid on right. Successful US guided right thoracentesis. Yielded of dark bloody fluid. Pt tolerated procedure well. No immediate complications.  Specimen was sent for labs. CXR ordered.  Brayton El PA-C 05/23/2012 12:10 PM

## 2012-05-23 NOTE — Progress Notes (Signed)
Rate controlled a-fib.  Convert to po cardizem once able to take po. Discussed with Dr. Sharon Seller, will sign-off. Call with questions. Follow-up in our office, former [facility] patient, I'm happy to see her.  Chrystie Nose, MD, [facility] Attending Cardiologist [facility]

## 2012-05-23 NOTE — Progress Notes (Signed)
ANTIBIOTIC CONSULT NOTE - FOLLOW UP  Pharmacy Consult for Zosyn Indication: Peritonitis  Allergies  Allergen Reactions  . Iohexol      Code: VOM, Desc: PT REPORTS VOMITING W/ IVP DYE- ARS 12/26/08, Onset Date: [id number]     Patient Measurements: Height: 5\' 10"  (177.8 cm) Weight: 185 lb 3 oz (84 kg) IBW/kg (Calculated) : 68.5  Adjusted Body Weight:   Vital Signs: Temp: 98 F (36.7 C) (11/01 0700) Temp src: Oral (11/01 0700) BP: 146/87 mmHg (11/01 0420) Pulse Rate: 85  (11/01 0420) Intake/Output from previous day: 10/31 0701 - 11/01 0700 In: 1489.4 [I.V.:565; IV Piggyback:210; TPN:714.4] Out: 1700 [Urine:1650; Emesis/NG output:50] Intake/Output from this shift:    Labs:  Basename 05/23/12 0854 05/23/12 0459 05/22/12 0800 05/22/12 0500 05/21/12 0830 05/21/12 0500  WBC 23.4* -- 31.4* -- 26.4* --  HGB 8.9* -- 9.4* -- 9.2* --  PLT 391 -- 392 -- 374 --  LABCREA -- -- -- -- -- --  CREATININE -- 0.78 -- 0.86 -- 0.89   Estimated Creatinine Clearance: 67.2 ml/min (by C-G formula based on Cr of 0.78). No results found for this basename: VANCOTROUGH:2,VANCOPEAK:2,VANCORANDOM:2,GENTTROUGH:2,GENTPEAK:2,GENTRANDOM:2,TOBRATROUGH:2,TOBRAPEAK:2,TOBRARND:2,AMIKACINPEAK:2,AMIKACINTROU:2,AMIKACIN:2, in the last 72 hours   Microbiology: Recent Results (from the past 720 hour(s))  SURGICAL PCR SCREEN     Status: Abnormal   Collection Time   05/14/12  4:10 AM      Component Value Range Status Comment   MRSA, PCR NEGATIVE  NEGATIVE Final    Staphylococcus aureus POSITIVE (*) NEGATIVE Final   URINE CULTURE     Status: Normal   Collection Time   05/16/12  1:50 AM      Component Value Range Status Comment   Specimen Description URINE, CATHETERIZED   Final    Special Requests NONE   Final    Culture  Setup Time 05/16/2012 02:27   Final    Colony Count NO GROWTH   Final    Culture NO GROWTH   Final    Report Status 05/16/2012 FINAL   Final     Anti-infectives     Start     Dose/Rate  Route Frequency Ordered Stop   05/21/12 0900  piperacillin-tazobactam (ZOSYN) IVPB 3.375 g       3.375 g 12.5 mL/hr over 240 Minutes Intravenous Every 8 hours 05/21/12 0827     05/18/12 2200   ciprofloxacin (CIPRO) IVPB 400 mg  Status:  Discontinued        400 mg 200 mL/hr over 60 Minutes Intravenous Every 12 hours 05/18/12 1457 05/21/12 0759   05/16/12 1000   ciprofloxacin (CIPRO) IVPB 200 mg  Status:  Discontinued        200 mg 100 mL/hr over 60 Minutes Intravenous Every 24 hours 05/16/12 0846 05/18/12 1457   05/16/12 0800   metroNIDAZOLE (FLAGYL) IVPB 500 mg  Status:  Discontinued        500 mg 100 mL/hr over 60 Minutes Intravenous Every 8 hours 05/16/12 0735 05/21/12 0759   05/14/12 0600   ceFAZolin (ANCEF) IVPB 2 g/50 mL premix        2 g 100 mL/hr over 30 Minutes Intravenous On call to O.R. 05/13/12 1424 05/14/12 1407          Assessment: 78yo female on Zosyn for peritonitis, to broaden antibiotic coverage.  Today is day #2 of Zosyn with WBC down to 23.4 and pt is afebrile.  Blood cultures x 2 drawn this AM as well as urine culture.  The previous urine culture  of 10/25 was negative  Goal of Therapy:  Resolution of infection  Plan:  1.  Continue Zosyn at current dosing 2.  F/U cultures  Marisue Humble, PharmD Clinical Pharmacist [facility]

## 2012-05-23 NOTE — Progress Notes (Signed)
Pt with some SOB on moving.  No BM/ +flatus.  CT revealed resolution of her abd fluid collection but an increased in a pulm effusion.  -Will ask IR to drain pulm fluid collection. -cont TNA -cont' NGT

## 2012-05-24 LAB — COMPREHENSIVE METABOLIC PANEL
AST: 14 U/L (ref 0–37)
CO2: 30 mEq/L (ref 19–32)
Calcium: 8.3 mg/dL — ABNORMAL LOW (ref 8.4–10.5)
Creatinine, Ser: 0.84 mg/dL (ref 0.50–1.10)
GFR calc non Af Amer: 64 mL/min — ABNORMAL LOW (ref >90)
Sodium: 139 mEq/L (ref 135–145)
Total Protein: 6.1 g/dL (ref 6.0–8.3)

## 2012-05-24 LAB — CBC
MCH: 28.8 pg (ref 26.0–34.0)
MCHC: 35.2 g/dL (ref 30.0–36.0)
MCV: 81.9 fL (ref 78.0–100.0)
Platelets: 428 10*3/uL — ABNORMAL HIGH (ref 150–400)
RBC: 2.81 MIL/uL — ABNORMAL LOW (ref 3.87–5.11)
RDW: 15.7 % — ABNORMAL HIGH (ref 11.5–15.5)

## 2012-05-24 LAB — URINE CULTURE
Colony Count: NO GROWTH
Culture: NO GROWTH

## 2012-05-24 LAB — PHOSPHORUS: Phosphorus: 2.8 mg/dL (ref 2.3–4.6)

## 2012-05-24 LAB — GLUCOSE, CAPILLARY
Glucose-Capillary: 168 mg/dL — ABNORMAL HIGH (ref 70–99)
Glucose-Capillary: 132 mg/dL — ABNORMAL HIGH (ref 70–99)

## 2012-05-24 MED ORDER — SODIUM GLYCEROPHOSPHATE 1 MMOLE/ML IV SOLN
20.0000 mmol | Freq: Once | INTRAVENOUS | Status: AC
  Administered 2012-05-24: 20 mmol via INTRAVENOUS
  Filled 2012-05-24: qty 20

## 2012-05-24 MED ORDER — ALUM & MAG HYDROXIDE-SIMETH 200-200-20 MG/5ML PO SUSP: 30.0000 mL | Freq: Four times a day (QID) | ORAL | Status: DC | PRN

## 2012-05-24 MED ORDER — LABETALOL HCL 5 MG/ML IV SOLN
5.0000 mg | Freq: Four times a day (QID) | INTRAVENOUS | Status: DC
  Administered 2012-05-24 – 2012-05-26 (×7): 5 mg via INTRAVENOUS
  Filled 2012-05-24 (×15): qty 4

## 2012-05-24 MED ORDER — MAGIC MOUTHWASH
15.0000 mL | Freq: Four times a day (QID) | ORAL | Status: DC | PRN
  Filled 2012-05-24: qty 15

## 2012-05-24 MED ORDER — INSULIN REGULAR HUMAN 100 UNIT/ML IJ SOLN
INTRAVENOUS | Status: AC
  Administered 2012-05-24: 18:00:00 via INTRAVENOUS
  Filled 2012-05-24: qty 2000

## 2012-05-24 MED ORDER — POTASSIUM CHLORIDE 10 MEQ/50ML IV SOLN
10.0000 meq | INTRAVENOUS | Status: AC
  Administered 2012-05-24 (×4): 10 meq via INTRAVENOUS
  Filled 2012-05-24: qty 150
  Filled 2012-05-24: qty 50

## 2012-05-24 MED ORDER — MAGNESIUM SULFATE IN D5W 10-5 MG/ML-% IV SOLN
1.0000 g | Freq: Once | INTRAVENOUS | Status: AC
  Administered 2012-05-24: 1 g via INTRAVENOUS
  Filled 2012-05-24: qty 100

## 2012-05-24 MED ORDER — LABETALOL HCL 5 MG/ML IV SOLN: 5.0000 mg | Freq: Four times a day (QID) | INTRAVENOUS | Status: DC

## 2012-05-24 MED ORDER — BLISTEX EX OINT
TOPICAL_OINTMENT | Freq: Two times a day (BID) | CUTANEOUS | Status: DC
  Administered 2012-05-24 – 2012-05-26 (×5): via TOPICAL
  Administered 2012-05-26: 1 via TOPICAL
  Administered 2012-05-27 – 2012-06-02 (×13): via TOPICAL
  Filled 2012-05-24: qty 10

## 2012-05-24 MED ORDER — LIP MEDEX EX OINT: 1.0000 "application " | TOPICAL_OINTMENT | Freq: Two times a day (BID) | CUTANEOUS | Status: DC

## 2012-05-24 NOTE — Progress Notes (Signed)
Pt NGT clamped per MD order at 1100, 1633 pt c/o fullness and being uncomfortable, ABD distended and firm bowel sounds present all 4 quads, MD notified, new order received, pt with positive bowel movement and passing gas(see documentation), nursing will cont to monitor

## 2012-05-24 NOTE — Progress Notes (Signed)
[facility] [place] TEAM 1 - Stepdown/ICU TEAM  Name: Nicole [name] Date of Birth: 1931-01-17 Admit Date: 05/12/2012 ZOX:WRUE, [name], DO  Brief Narrative: 78 year old female who was admitted on 05/12/2012 with 2-3 days of persistent right upper quadrant abdominal pain worse after eating. She had been evaluated in the emergency department 24 hours before admission and ultrasound revealed symptomatic cholelithiasis. She had been evaluated by surgery that time and plans were to proceed with elective cholecystectomy after patient stopped her Plavix and aspirin for one week. Unfortunately she returned to the ER because of uncontrolled pain with vomiting. sHe subsequently underwent a laparoscopic cholecystectomy on 05/14/2012. Postoperatively She developed severe abdominal pain associated with nausea and vomiting. HIDA scan revealed bile leak. In the interim she developed atrial fibrillation with RVR and was subsequentLY started on Cardizem drip. CT scan performed demonstrated small bowel obstruction that warranted NG tube placement and surgical consultation. She has subsequently undergone ERCP with stent placement on 05/17/2012.  Assessment/Plan:  Abdominal Distention 2/2 post operative partial SBO/ileus - Care as per Gen Surgery -Abdominal distention is contributing to mechanical respiratory problems i.e. limited inspiration due to elevated diaphragm from abdominal distention  -Bile Leak -following Lap Cholecystectomy 10/23 -S/P ERCP with Stent placement 10/26 -Initially was tx'd with empiric Cipro and Flagyl - WBC counts rising so surgery dc'd  In favor of Zosyn   -CT abdomen and pelvis 05/22/2012 revealed near resolution of fluid collection near the liver and no evidence of abscess or other findings to explain leukocytosis  Abdominal Wall hematoma Hgb drifting downward - will recheck in AM  Acute Renal Failure -Resolved -appreciate Renal eval -suspicion for hemodynamic  mediated renal injury-as patient was very briefly hypotensive on 10/24-retrospectively-hypotension probably was a result from developing abd wall hematoma/?intra-abdominal pathology  Afib with RVR -c/w cardizem gtt until able to take oral meds -not a candidate for anticoagulation-given hematoma/potential for repeart surgery, more importantly has been deemed not a be candidate for anticoagulation by her cardiologist in the past as well-?non compliance - plan to convert to oral regimen once PSBO resolved  Acute hypoxic Resp Failure due to : A) compressive atelectasis/mechanical obstruction secondary to abdominal distention/pleural effusion -stable   B) bilateral pleural effusion -Status post thoracentesis 11/01 right lung with 470 cc of dark bloody fluid obtained -Fluid sent for culture and cytology  Persistent leukocytosis -Likely related to pulmonary processes with compressive atelectasis and bloody effusion -improving   Cholecystitis -s/p Lap cholecystectomy on 10/23 -unfortunately complicated by abdominal wall hematoma and bile leak-see above  Hypokalemia -2/2 to GI loss via NG suction -replete and recheck - goal is K+ of 4.0 or >  Hypernatremia Resolved w/ addition of free water  DM-uncontrolled -holding oral agents -CBGs better controlled   CHF (systolic EF 45% and diastolic dysfunction-grade 1) -compensated -moderate TR due to pulmonary HTN  HTN Uncontrolled - adjust med tx and follow trend   Thoracic Aortic Aneurysm outpatient follow up  H/O Stage III colon Ca outpatient follow up with Oncology  Disposition: Remain on SDU (cardizem gtt) DVT Prophylaxis: scd's Code Status: Full code   Procedures:  Laparoscopic Cholecystectomy 05/14/12  ERCP 05/17/12  Right thoracentesis per interventional radiology 05/23/2012  CONSULTS: Cardiology - signed off Nephrology - signed off GI - signed off General Surgery Interventional radiology   Subjective: Feels  much better today after having a bowel movement.  Denies sob, cp, n/v, or current abdom pain.    PHYSICAL EXAM: Vital signs in last 24 hours: Filed Vitals:  05/24/12 0412 05/24/12 0727 05/24/12 0728 05/24/12 0800  BP: 161/82  162/74   Pulse: 77  84 78  Temp: 98 F (36.7 C) 98.7 F (37.1 C)    TempSrc: Oral Oral    Resp: 26  23 24   Height:      Weight:      SpO2: 96%  98% 97%   Weight change:  Body mass index is 26.57 kg/(m^2).   Chest: Coarse and diminished in the bases, no wheezing CVS: S1 S2 Regular, rate controlled Abdomen: Remains mildly distended, slightly tender in the epigastric/RUQ region, BS decreased - NGT clamped Extremities: no edema, lower extremities warm to touch. Neurologic: Non Focal.     Intake/Output from previous day:  Intake/Output Summary (Last 24 hours) at 05/24/12 1018 Last data filed at 05/24/12 0800  Gross per 24 hour  Intake    250 ml  Output   3100 ml  Net  -2850 ml    LAB RESULTS: CBC  Lab 05/24/12 0448 05/23/12 0854 05/22/12 0800 05/21/12 0830 05/20/12 0850 05/19/12 0415  WBC 22.8* 23.4* 31.4* 26.4* 21.1* --  HGB 8.1* 8.9* 9.4* 9.2* 9.2* --  HCT 23.0* 25.5* 26.5* 26.6* 26.7* --  PLT 428* 391 392 374 335 --  MCV 81.9 81.2 81.5 81.8 82.4 --  MCH 28.8 28.3 28.9 28.3 28.4 --  MCHC 35.2 34.9 35.5 34.6 34.5 --  RDW 15.7* 15.7* 15.6* 15.6* 15.4 --  LYMPHSABS -- -- -- -- -- 0.8  MONOABS -- -- -- -- -- 2.6*  EOSABS -- -- -- -- -- 0.1  BASOSABS -- -- -- -- -- 0.0  BANDABS -- -- -- -- -- --    Chemistries   Lab 05/24/12 0448 05/23/12 0459 05/22/12 0500 05/21/12 0500 05/20/12 0850  NA 139 140 140 141 142  K 3.6 3.6 4.2 3.4* 4.0  CL 105 105 106 105 106  CO2 30 27 29 27 29   GLUCOSE 148* 204* 247* 182* 216*  BUN 18 17 16 16 18   CREATININE 0.84 0.78 0.86 0.89 1.04  CALCIUM 8.3* 8.6 8.7 8.5 8.5  MG 2.0 1.8 1.9 1.8 1.8    CBG:  Lab 05/24/12 0801 05/24/12 0337 05/23/12 2311 05/23/12 2219 05/23/12 1727  GLUCAP 153* 168* 142* 155* 109*    MICROBIOLOGY: Recent Results (from the past 240 hour(s))  URINE CULTURE     Status: Normal   Collection Time   05/16/12  1:50 AM      Component Value Range Status Comment   Specimen Description URINE, CATHETERIZED   Final    Special Requests NONE   Final    Culture  Setup Time 05/16/2012 02:27   Final    Colony Count NO GROWTH   Final    Culture NO GROWTH   Final    Report Status 05/16/2012 FINAL   Final   BODY FLUID CULTURE     Status: Normal (Preliminary result)   Collection Time   05/23/12 12:03 PM      Component Value Range Status Comment   Specimen Description PLEURAL FLUID RIGHT   Final    Special Requests   Final    Gram Stain     Final    Value: RARE WBC PRESENT, PREDOMINANTLY PMN     NO ORGANISMS SEEN   Culture PENDING   Incomplete    Report Status PENDING   Incomplete    MEDICATIONS: Reviewed by M.D.  Lonia Blood, MD [facility] Office  [phone] Pager [phone]  On-Call/Text Page:      [email or web address]      password [facility]  05/24/2012, 10:18 AM   LOS: 12 days

## 2012-05-24 NOTE — Progress Notes (Signed)
Patient ID: Nicole [name], female   DOB: 1931/04/01, 78 y.o.   MRN: [id number]  7 Days Post-Op  Subjective: Pt doing much better, feels better, having large BM today and +flatus, wants to eat  Objective: Vital signs in last 24 hours: Temp:  [97.9 F (36.6 C)-98.7 F (37.1 C)] 98.7 F (37.1 C) (11/02 0727) Pulse Rate:  [77-90] 78  (11/02 0800) Resp:  [23-28] 24  (11/02 0800) BP: (131-176)/(67-91) 162/74 mmHg (11/02 0728) SpO2:  [96 %-99 %] 97 % (11/02 0800) Last BM Date: 05/10/12  Intake/Output from previous day: 11/01 0701 - 11/02 0700 In: 250 [I.V.:150; IV Piggyback:100] Out: 2750 [Urine:2600; Emesis/NG output:150] Intake/Output this shift: Total I/O In: -  Out: 350 [Urine:350]  PE: Abd- less distended, soft, no bowel sounds, incisions c/d/i, mildly tender,  Lab Results:   Basename 05/24/12 0448 05/23/12 0854  WBC 22.8* 23.4*  HGB 8.1* 8.9*  HCT 23.0* 25.5*  PLT 428* 391   BMET  Basename 05/24/12 0448 05/23/12 0459  NA 139 140  K 3.6 3.6  CL 105 105  CO2 30 27  GLUCOSE 148* 204*  BUN 18 17  CREATININE 0.84 0.78  CALCIUM 8.3* 8.6   PT/INR No results found for this basename: LABPROT:2,INR:2 in the last 72 hours Comprehensive Metabolic Panel:    Component Value Date/Time   NA 139 05/24/2012 0448   K 3.6 05/24/2012 0448   CL 105 05/24/2012 0448   CO2 30 05/24/2012 0448   BUN 18 05/24/2012 0448   CREATININE 0.84 05/24/2012 0448   GLUCOSE 148* 05/24/2012 0448   CALCIUM 8.3* 05/24/2012 0448   AST 14 05/24/2012 0448   ALT 7 05/24/2012 0448   ALKPHOS 88 05/24/2012 0448   BILITOT 0.6 05/24/2012 0448   PROT 6.1 05/24/2012 0448   ALBUMIN 2.1* 05/24/2012 0448     Studies/Results: Ct Abdomen Wo Contrast  05/22/2012    *RADIOLOGY REPORT*  Clinical Data:  Abdominal pain and leukocytosis. Bile leak.  CT ABDOMEN WITHOUT CONTRAST  Technique:  Multidetector CT imaging of the abdomen was performed following the standard protocol without IV contrast.  Comparison:  CT scan  dated 05/16/2012  Findings:  There is persistent dilatation of proximal small bowel consistent with a partial small bowel obstruction.  The colon is not dilated.  Biliary stent has been placed.  There is some reflux of the oral contrast up the stent into the common bile duct.  There are focal areas of loculated hemorrhage along the inferior margin of the right and left lobes of the liver, not significantly changed since the prior study.  The hemorrhage and ascites around the right lobe of the liver laterally and inferiorly have almost resolved. No bile duct dilatation.  The bilateral pleural effusions have increased, right greater than left, with increased compressive atelectasis of the lower lobes.  No other change since the prior exam.  IMPRESSION:  1.  Interval insertion of biliary stent.  Almost complete resolution of the fluid adjacent to the right lobe of the liver. 2.  Stable perihepatic hemorrhage adjacent to the inferior aspect of the left and right lobes of the liver. 3.  Increasing bilateral pleural effusions with increased atelectasis at both lung bases. 4.  Persistent partial small bowel obstruction.   Original Report Authenticated By: Francene Boyers, M.D.    Dg Chest 1 View  05/23/2012  *RADIOLOGY REPORT*  Clinical Data: 78 year old female status post thoracentesis.  CHEST - 1 VIEW  Comparison: 0749 hours the same day and  earlier.  Findings: AP portable semi upright view 1234 hours.  No pneumothorax.  Mildly improved ventilation at both lung bases. Left pleural effusion stable or mildly decreased. Small/trace right effusion.  Stable cardiac size and mediastinal contours.  Right PICC line and enteric tube are stable.  IMPRESSION: No pneumothorax and mildly improved bibasilar ventilation following thoracentesis.   Original Report Authenticated By: Erskine Speed, M.D.    Dg Chest Port 1 View  05/23/2012  *RADIOLOGY REPORT*  Clinical Data: Leukocytosis, hypoxia  PORTABLE CHEST - 1 VIEW  Comparison:  05/19/2012  Findings: Cardiomegaly with pulmonary vascular congestion and suspected mild central pulmonary edema in the right perihilar region.  Bibasilar opacities, possibly atelectasis.  No pneumothorax.  Stable enteric tube and right arm PICC.  IMPRESSION: Cardiomegaly with pulmonary vascular congestion and suspected mild central pulmonary edema.  Bibasilar opacities, possibly atelectasis.   Original Report Authenticated By: Charline Bills, M.D.    Dg Abd Portable 1v  05/23/2012  *RADIOLOGY REPORT*  Clinical Data: Partial small bowel obstruction.  PORTABLE ABDOMEN - 1 VIEW  Comparison: CT 05/22/2012 and abdominal radiograph 05/21/2012  Findings: Supine images again demonstrate dilated loops of bowel along the left mid abdomen.  There is gas and high dense material within the colon.  Surgical clips in the right upper quadrant. Evidence for a non metallic biliary stent.  Small amount of gas in the rectum. A nasogastric tube is present.  There appears to be consolidation at the left lung base.  IMPRESSION: Persistent dilated loops of bowel in the left abdomen.  This finding has minimally changed since the previous examination. Findings could be associated with a partial small bowel obstruction.   Original Report Authenticated By: Richarda Overlie, M.D.    US Thoracentesis Asp Pleural Space W/img Guide  05/23/2012  *RADIOLOGY REPORT*  Clinical Data:  Recent abdominal surgery, shortness of breath, bilateral pleural effusions  ULTRASOUND GUIDED right THORACENTESIS  Comparison:  None  An ultrasound guided thoracentesis was thoroughly discussed with the patient and questions answered.  The benefits, risks, alternatives and complications were also discussed.  The patient understands and wishes to proceed with the procedure.  Written consent was obtained.  Ultrasound was performed to each side of the chest, small amount of fluid noted on left. Moderate amount of dense appearing fluid on right. Right side chosen to  localize and mark an adequate pocket of fluid in the right chest.  The area was then prepped and draped in the normal sterile fashion.  1% Lidocaine was used for local anesthesia.  Under ultrasound guidance a 19 gauge Yueh catheter was introduced.  Thoracentesis was performed.  The catheter was removed and a dressing applied.  Complications:  None immediate  Findings: A total of approximately 470 ml of dark bloody fluid was removed. A fluid sample was sent for laboratory analysis.  IMPRESSION: Successful ultrasound guided right thoracentesis yielding 470 ml of bloody pleural fluid.  Read by Brayton El PA-C   Original Report Authenticated By: Judie Petit. Miles Costain, M.D.     Anti-infectives: Anti-infectives     Start     Dose/Rate Route Frequency Ordered Stop   05/21/12 0900   piperacillin-tazobactam (ZOSYN) IVPB 3.375 g        3.375 g 12.5 mL/hr over 240 Minutes Intravenous Every 8 hours 05/21/12 0827     05/18/12 2200   ciprofloxacin (CIPRO) IVPB 400 mg  Status:  Discontinued        400 mg 200 mL/hr over 60 Minutes Intravenous Every 12  hours 05/18/12 1457 05/21/12 0759   05/16/12 1000   ciprofloxacin (CIPRO) IVPB 200 mg  Status:  Discontinued        200 mg 100 mL/hr over 60 Minutes Intravenous Every 24 hours 05/16/12 0846 05/18/12 1457   05/16/12 0800   metroNIDAZOLE (FLAGYL) IVPB 500 mg  Status:  Discontinued        500 mg 100 mL/hr over 60 Minutes Intravenous Every 8 hours 05/16/12 0735 05/21/12 0759   05/14/12 0600   ceFAZolin (ANCEF) IVPB 2 g/50 mL premix        2 g 100 mL/hr over 30 Minutes Intravenous On call to O.R. 05/13/12 1424 05/14/12 1407          Assessment 1. POD#10-lap chole: bile leak post-op, s/p ERCP with stenting of the CBD, now with post-operative ileus.  WBC trending down now, +bowel function now.  Pleural thoracentesis yesterday.  Seems to be turning corner  --clamp NGT and try clears  --ambulate in halls  --if does well today then may be able to d/c NGT  tomorrw  --pleural effusions tapped yesterday and had dark bloody fluid (cultures sent)   --TNA  --On zosyn   Scotti Motter 05/24/2012

## 2012-05-24 NOTE — Progress Notes (Signed)
PARENTERAL NUTRITION CONSULT NOTE - FOLLOW UP  Pharmacy Consult: TPN Indication:  SBO  Allergies  Allergen Reactions  . Iohexol      Code: VOM, Desc: PT REPORTS VOMITING W/ IVP DYE- ARS 12/26/08, Onset Date: [id number]     Patient Measurements: Height: 5\' 10"  (177.8 cm) Weight: 185 lb 3 oz (84 kg) IBW/kg (Calculated) : 68.5  Adjusted Body Weight: 74 kg  Vital Signs: Temp: 98.7 F (37.1 C) (11/02 0727) Temp src: Oral (11/02 0727) BP: 162/74 mmHg (11/02 0728) Pulse Rate: 84  (11/02 0728) Intake/Output from previous day: 11/01 0701 - 11/02 0700 In: 250 [I.V.:150; IV Piggyback:100] Out: 2750 [Urine:2600; Emesis/NG output:150]  Labs:  [facility] 05/24/12 0448 05/23/12 0854 05/22/12 0800  WBC 22.8* 23.4* 31.4*  HGB 8.1* 8.9* 9.4*  HCT 23.0* 25.5* 26.5*  PLT 428* 391 392  APTT -- -- --  INR -- -- --     Basename 05/24/12 0448 05/23/12 0459 05/22/12 0500  NA 139 140 140  K 3.6 3.6 4.2  CL 105 105 106  CO2 30 27 29   GLUCOSE 148* 204* 247*  BUN 18 17 16   CREATININE 0.84 0.78 0.86  LABCREA -- -- --  CREAT24HRUR -- -- --  CALCIUM 8.3* 8.6 8.7  MG 2.0 1.8 1.9  PHOS 2.8 2.8 2.4  PROT 6.1 -- 6.1  ALBUMIN 2.1* -- 2.3*  AST 14 -- 12  ALT 7 -- 6  ALKPHOS 88 -- 76  BILITOT 0.6 -- 0.7  BILIDIR -- -- --  IBILI -- -- --  PREALBUMIN -- -- --  TRIG -- -- --  CHOLHDL -- -- --  CHOL -- -- --   Estimated Creatinine Clearance: 64 ml/min (by C-G formula based on Cr of 0.84).    Basename 05/24/12 0801 05/24/12 0337 05/23/12 2311  GLUCAP 153* 168* 142*    Insulin Requirements in the past 12 hours:  12 units from SSI + 80 units regular insulin in TPN  Current Nutrition:  TPN, NPO  Nutritional Goals:  1700-1900 kCal, 90-100 grams of protein per day  Assessment:  GI: s/p lap cholecystectomy 10/23 and ERCP with stent placement on 10/26. Has NG in place for abdominal distention, with decreasing output. CT shows persistent SBO. Endo: hx hypothyroid on Synthroid.  hx DM2 on  metformin and glipizide PTA. CBGs are improved 109-168.  Note Lantus 10 units BID was added 10/31. Lytes: K has decreased (goal ~4 with ileus), Mag is just at goal of 2 and Phos is normal, but below goal of 4 in setting of ileus. Renal: Had some ARF post-op d/t hemodynamic renal injury, now resolved.  SCr 0.84, good UOP. NS available for KVO. Pulm: 3L [place]. S/p thoracentesis 11/1 for pleural effusion Cards: hx CHF (EF 45-50%) / Afib / HTN - on diltiazem drip, not an anticoagulation candidate d/t hematoma and possible need for repeat surgery. Not on warfarin PTA either.  BP mildly elevated, HR within goal Hepatobil: LFTs WNL. Chol and Trig nml. Prealbumin is low in setting of post-op and ongoing inflammation. Neuro: no issues ID: Cipro/Flagyl changed to Zosyn (d#4) for increased intraabdominal coverage. Cultures remain negative. Ongoing leukocytosis, pt remains afebrile. Best Practices: SCD for VTE prophylaxis, IV PPI TPN Access: PICC placed 10/27 TPN day#: 6  Plan:  - Continue Clinimix E 5/15 at goal rate of 80 ml/hr. TPN at goal rate will provide 100% goal protein and 92% of goal (avg) Kcals. - Supplement lipids, multivitamins, and trace elements only on M-W-F due to  shortages - Give Sodium Glycerophosphate IV x 1 today - Give KCl x 4 runs - Give Magnesium 1gm IV x 1 - Continue with 80 units of regular insulin in each bag of TPN.  Continue SSI at resistant scale and Lantus - Check BMET, Mag, Phos with AM labs - Follow-up CBGs and insulin requirements  Estella Husk, Pharm.D., BCPS Clinical Pharmacist  Phone [phone] Pager [phone] 05/24/2012, 8:48 AM

## 2012-05-24 NOTE — Progress Notes (Signed)
Agree w NGT clamping trial Clears to start Mobilize more Follow leukocytosis - clinically better & last CT better, so hopeful sign

## 2012-05-25 DIAGNOSIS — E119 Type 2 diabetes mellitus without complications: Secondary | ICD-10-CM

## 2012-05-25 LAB — PHOSPHORUS: Phosphorus: 3.6 mg/dL (ref 2.3–4.6)

## 2012-05-25 LAB — CBC
HCT: 24.8 % — ABNORMAL LOW (ref 36.0–46.0)
Hemoglobin: 8.6 g/dL — ABNORMAL LOW (ref 12.0–15.0)
MCH: 28 pg (ref 26.0–34.0)
MCHC: 34.7 g/dL (ref 30.0–36.0)
RBC: 3.07 MIL/uL — ABNORMAL LOW (ref 3.87–5.11)

## 2012-05-25 LAB — BASIC METABOLIC PANEL
CO2: 29 mEq/L (ref 19–32)
GFR calc non Af Amer: 63 mL/min — ABNORMAL LOW (ref >90)
Glucose, Bld: 124 mg/dL — ABNORMAL HIGH (ref 70–99)
Potassium: 3.8 mEq/L (ref 3.5–5.1)
Sodium: 138 mEq/L (ref 135–145)

## 2012-05-25 LAB — GLUCOSE, CAPILLARY
Glucose-Capillary: 121 mg/dL — ABNORMAL HIGH (ref 70–99)
Glucose-Capillary: 131 mg/dL — ABNORMAL HIGH (ref 70–99)
Glucose-Capillary: 215 mg/dL — ABNORMAL HIGH (ref 70–99)

## 2012-05-25 MED ORDER — INSULIN REGULAR HUMAN 100 UNIT/ML IJ SOLN
INTRAVENOUS | Status: AC
  Administered 2012-05-25: 18:00:00 via INTRAVENOUS
  Filled 2012-05-25: qty 2000

## 2012-05-25 MED ORDER — SIMETHICONE 40 MG/0.6ML PO SUSP
40.0000 mg | Freq: Four times a day (QID) | ORAL | Status: DC | PRN
  Filled 2012-05-25: qty 0.6

## 2012-05-25 NOTE — Progress Notes (Signed)
Patient ID: Nicole [name], female   DOB: April 10, 1931, 78 y.o.   MRN: [id number] 8 Days Post-Op  Subjective: Pt did well with NGT clamped, feels better, +BM and +flatus, denies n/v  Objective: Vital signs in last 24 hours: Temp:  [97.4 F (36.3 C)-98.4 F (36.9 C)] 97.4 F (36.3 C) (11/03 0736) Pulse Rate:  [76-81] 77  (11/03 0348) Resp:  [21-25] 25  (11/03 0348) BP: (139-159)/(67-81) 147/80 mmHg (11/03 0348) SpO2:  [94 %-100 %] 94 % (11/03 0348) Last BM Date: 05/10/12  Intake/Output from previous day: 11/02 0701 - 11/03 0700 In: 750 [P.O.:480; I.V.:110; TPN:160] Out: 1756 [Urine:1700; Emesis/NG output:55; Stool:1] Intake/Output this shift: Total I/O In: -  Out: 300 [Urine:300]  PE: Abd- mildly distended, soft, + bowel sounds, incisions c/d/i, no tenderness,  Lab Results:   Basename 05/25/12 0400 05/24/12 0448  WBC 19.4* 22.8*  HGB 8.6* 8.1*  HCT 24.8* 23.0*  PLT 415* 428*   BMET  Basename 05/25/12 0400 05/24/12 0448  NA 138 139  K 3.8 3.6  CL 104 105  CO2 29 30  GLUCOSE 124* 148*  BUN 18 18  CREATININE 0.86 0.84  CALCIUM 8.3* 8.3*   PT/INR No results found for this basename: LABPROT:2,INR:2 in the last 72 hours Comprehensive Metabolic Panel:    Component Value Date/Time   NA 138 05/25/2012 0400   K 3.8 05/25/2012 0400   CL 104 05/25/2012 0400   CO2 29 05/25/2012 0400   BUN 18 05/25/2012 0400   CREATININE 0.86 05/25/2012 0400   GLUCOSE 124* 05/25/2012 0400   CALCIUM 8.3* 05/25/2012 0400   AST 14 05/24/2012 0448   ALT 7 05/24/2012 0448   ALKPHOS 88 05/24/2012 0448   BILITOT 0.6 05/24/2012 0448   PROT 6.1 05/24/2012 0448   ALBUMIN 2.1* 05/24/2012 0448     Studies/Results: Dg Chest 1 View  05/23/2012  *RADIOLOGY REPORT*  Clinical Data: 78 year old female status post thoracentesis.  CHEST - 1 VIEW  Comparison: 0749 hours the same day and earlier.  Findings: AP portable semi upright view 1234 hours.  No pneumothorax.  Mildly improved ventilation at both lung  bases. Left pleural effusion stable or mildly decreased. Small/trace right effusion.  Stable cardiac size and mediastinal contours.  Right PICC line and enteric tube are stable.  IMPRESSION: No pneumothorax and mildly improved bibasilar ventilation following thoracentesis.   Original Report Authenticated By: Erskine Speed, M.D.    Dg Abd Portable 1v  05/23/2012  *RADIOLOGY REPORT*  Clinical Data: Partial small bowel obstruction.  PORTABLE ABDOMEN - 1 VIEW  Comparison: CT 05/22/2012 and abdominal radiograph 05/21/2012  Findings: Supine images again demonstrate dilated loops of bowel along the left mid abdomen.  There is gas and high dense material within the colon.  Surgical clips in the right upper quadrant. Evidence for a non metallic biliary stent.  Small amount of gas in the rectum. A nasogastric tube is present.  There appears to be consolidation at the left lung base.  IMPRESSION: Persistent dilated loops of bowel in the left abdomen.  This finding has minimally changed since the previous examination. Findings could be associated with a partial small bowel obstruction.   Original Report Authenticated By: Richarda Overlie, M.D.    US Thoracentesis Asp Pleural Space W/img Guide  05/23/2012  *RADIOLOGY REPORT*  Clinical Data:  Recent abdominal surgery, shortness of breath, bilateral pleural effusions  ULTRASOUND GUIDED right THORACENTESIS  Comparison:  None  An ultrasound guided thoracentesis was thoroughly discussed with the  patient and questions answered.  The benefits, risks, alternatives and complications were also discussed.  The patient understands and wishes to proceed with the procedure.  Written consent was obtained.  Ultrasound was performed to each side of the chest, small amount of fluid noted on left. Moderate amount of dense appearing fluid on right. Right side chosen to localize and mark an adequate pocket of fluid in the right chest.  The area was then prepped and draped in the normal sterile fashion.   1% Lidocaine was used for local anesthesia.  Under ultrasound guidance a 19 gauge Yueh catheter was introduced.  Thoracentesis was performed.  The catheter was removed and a dressing applied.  Complications:  None immediate  Findings: A total of approximately 470 ml of dark bloody fluid was removed. A fluid sample was sent for laboratory analysis.  IMPRESSION: Successful ultrasound guided right thoracentesis yielding 470 ml of bloody pleural fluid.  Read by Brayton El PA-C   Original Report Authenticated By: Judie Petit. Miles Costain, M.D.     Anti-infectives: Anti-infectives     Start     Dose/Rate Route Frequency Ordered Stop   05/21/12 0900   piperacillin-tazobactam (ZOSYN) IVPB 3.375 g        3.375 g 12.5 mL/hr over 240 Minutes Intravenous Every 8 hours 05/21/12 0827     05/18/12 2200   ciprofloxacin (CIPRO) IVPB 400 mg  Status:  Discontinued        400 mg 200 mL/hr over 60 Minutes Intravenous Every 12 hours 05/18/12 1457 05/21/12 0759   05/16/12 1000   ciprofloxacin (CIPRO) IVPB 200 mg  Status:  Discontinued        200 mg 100 mL/hr over 60 Minutes Intravenous Every 24 hours 05/16/12 0846 05/18/12 1457   05/16/12 0800   metroNIDAZOLE (FLAGYL) IVPB 500 mg  Status:  Discontinued        500 mg 100 mL/hr over 60 Minutes Intravenous Every 8 hours 05/16/12 0735 05/21/12 0759   05/14/12 0600   ceFAZolin (ANCEF) IVPB 2 g/50 mL premix        2 g 100 mL/hr over 30 Minutes Intravenous On call to O.R. 05/13/12 1424 05/14/12 1407          Assessment 1. POD#11-lap chole: bile leak post-op, s/p ERCP with stenting of the CBD, now with post-operative ileus.  WBC trending down now, +bowel function now.  Pleural thoracentesis.  Seems to be turning corner  --d/c NGT  --keep on clears today then advance tomorrow  --ambulate in halls  --pleural effusions tapped and had dark bloody fluid (cultures sent)   --TNA  --On zosyn   Scout Guyett 05/25/2012

## 2012-05-25 NOTE — Progress Notes (Signed)
D/C NGT Patient examined and I agree with the assessment and plan  Violeta Gelinas, MD, MPH, FACS Pager: [phone]  05/25/2012 9:31 AM

## 2012-05-25 NOTE — Progress Notes (Signed)
[facility] [place] TEAM 1 - Stepdown/ICU TEAM  Name: Nicole [name] Date of Birth: December 27, 1930 Admit Date: 05/12/2012 ZOX:[id number], [name], DO  Brief Narrative: 78 year old female who was admitted on 05/12/2012 with 2-3 days of persistent right upper quadrant abdominal pain worse after eating. She had been evaluated in the emergency department 24 hours before admission and ultrasound revealed symptomatic cholelithiasis. She had been evaluated by surgery that time and plans were to proceed with elective cholecystectomy after patient stopped her Plavix and aspirin for one week. Unfortunately she returned to the ER because of uncontrolled pain with vomiting. sHe subsequently underwent a laparoscopic cholecystectomy on 05/14/2012. Postoperatively She developed severe abdominal pain associated with nausea and vomiting. HIDA scan revealed bile leak. In the interim she developed atrial fibrillation with RVR and was subsequentLY started on Cardizem drip. CT scan performed demonstrated small bowel obstruction that warranted NG tube placement and surgical consultation. She has subsequently undergone ERCP with stent placement on 05/17/2012.  Assessment/Plan:  Abdominal Distention 2/2 post operative partial SBO/ileus Care as per Gen Surgery - pt is improving nicely  -Bile Leak -following Lap Cholecystectomy 10/23 -S/P ERCP with Stent placement 10/26 -Initially was tx'd with empiric Cipro and Flagyl - WBC counts were rising so surgery dc'd in favor of Zosyn   -CT abdomen and pelvis 05/22/2012 revealed near resolution of fluid collection near the liver and no evidence of abscess or other findings to explain leukocytosis  Abdominal Wall hematoma Hgb now trending upward   Acute Renal Failure -Resolved -suspicion for hemodynamic mediated renal injury-as patient was very briefly hypotensive on 10/24-retrospectively-hypotension probably was a result from developing abd wall hematoma/?intra-abdominal  pathology  Afib with RVR -c/w cardizem gtt until able to more reliably tolerate diet -not a candidate for anticoagulation-given hematoma/potential for repeart surgery, more importantly has been deemed not a be candidate for anticoagulation by her cardiologist in the past as well-?non compliance  Acute hypoxic Resp Failure due to : A) compressive atelectasis/mechanical obstruction secondary to abdominal distention/pleural effusion -stable   B) bilateral pleural effusion -Status post thoracentesis 11/01 right lung with 470 cc of dark bloody fluid obtained -Fluid sent for culture and cytology  Persistent leukocytosis -Likely related to pulmonary processes with compressive atelectasis and bloody effusion -improving   Cholecystitis -s/p Lap cholecystectomy on 10/23 -unfortunately complicated by abdominal wall hematoma and bile leak-see above  Hypokalemia -2/2 to GI loss via NG suction -replete and recheck - goal is K+ of 4.0 or >  Hypernatremia Resolved w/ addition of free water  DM-uncontrolled -holding oral agents -CBGs better controlled   CHF (systolic EF 45% and diastolic dysfunction-grade 1) -compensated -moderate TR due to pulmonary HTN  HTN Much improved today - cont to follow  Thoracic Aortic Aneurysm outpatient follow up  H/O Stage III colon Ca outpatient follow up with Oncology  Disposition: Remain on SDU (cardizem gtt) DVT Prophylaxis: scd's Code Status: Full code   Procedures:  Laparoscopic Cholecystectomy 05/14/12  ERCP 05/17/12  Right thoracentesis per interventional radiology 05/23/2012  CONSULTS: Cardiology - signed off Nephrology - signed off GI - signed off General Surgery Interventional radiology   Subjective: Feels very "gassy."  Denies f/c, sob, n/v, or chest pain.    PHYSICAL EXAM: Vital signs in last 24 hours: Filed Vitals:   05/25/12 0708 05/25/12 0736 05/25/12 0740 05/25/12 1120  BP: 153/85  128/83   Pulse: 78     Temp:   97.4 F (36.3 C)  98.4 F (36.9 C)  TempSrc:  Oral  Oral  Resp: 25     Height:      Weight:      SpO2: 94%      Weight change:  Body mass index is 26.57 kg/(m^2).   Chest: good air movement th/o - no wheeze  CVS: regular rate - irreg rhythm - no gallup or rub  Abdomen: less distended - BS+ - no rebound - no focal mass Extremities: no signif C/C/E B LE  Intake/Output from previous day:  Intake/Output Summary (Last 24 hours) at 05/25/12 1503 Last data filed at 05/25/12 1119  Gross per 24 hour  Intake    180 ml  Output   1656 ml  Net  -1476 ml    LAB RESULTS: CBC  Lab 05/25/12 0400 05/24/12 0448 05/23/12 0854 05/22/12 0800 05/21/12 0830 05/19/12 0415  WBC 19.4* 22.8* 23.4* 31.4* 26.4* --  HGB 8.6* 8.1* 8.9* 9.4* 9.2* --  HCT 24.8* 23.0* 25.5* 26.5* 26.6* --  PLT 415* 428* 391 392 374 --  MCV 80.8 81.9 81.2 81.5 81.8 --  MCH 28.0 28.8 28.3 28.9 28.3 --  MCHC 34.7 35.2 34.9 35.5 34.6 --  RDW 15.7* 15.7* 15.7* 15.6* 15.6* --  LYMPHSABS -- -- -- -- -- 0.8  MONOABS -- -- -- -- -- 2.6*  EOSABS -- -- -- -- -- 0.1  BASOSABS -- -- -- -- -- 0.0  BANDABS -- -- -- -- -- --    Chemistries   Lab 05/25/12 0400 05/24/12 0448 05/23/12 0459 05/22/12 0500 05/21/12 0500  NA 138 139 140 140 141  K 3.8 3.6 3.6 4.2 3.4*  CL 104 105 105 106 105  CO2 29 30 27 29 27   GLUCOSE 124* 148* 204* 247* 182*  BUN 18 18 17 16 16   CREATININE 0.86 0.84 0.78 0.86 0.89  CALCIUM 8.3* 8.3* 8.6 8.7 8.5  MG 1.8 2.0 1.8 1.9 1.8    CBG:  Lab 05/25/12 1121 05/25/12 0738 05/25/12 0447 05/24/12 2339 05/24/12 2026  GLUCAP 215* 131* 121* 131* 132*   MICROBIOLOGY: Recent Results (from the past 240 hour(s))  URINE CULTURE     Status: Normal   Collection Time   05/16/12  1:50 AM      Component Value Range Status Comment   Specimen Description URINE, CATHETERIZED   Final    Special Requests NONE   Final    Culture  Setup Time 05/16/2012 02:27   Final    Colony Count NO GROWTH   Final    Culture NO  GROWTH   Final    Report Status 05/16/2012 FINAL   Final   CULTURE, BLOOD (ROUTINE X 2)     Status: Normal (Preliminary result)   Collection Time   05/23/12  8:54 AM      Component Value Range Status Comment   Specimen Description BLOOD LEFT ARM   Final    Special Requests BOTTLES DRAWN AEROBIC AND ANAEROBIC 10CC   Final    Culture  Setup Time 05/23/2012 15:31   Final    Culture     Final    Value:        BLOOD CULTURE RECEIVED NO GROWTH TO DATE CULTURE WILL BE HELD FOR 5 DAYS BEFORE ISSUING A FINAL NEGATIVE REPORT   Report Status PENDING   Incomplete   CULTURE, BLOOD (ROUTINE X 2)     Status: Normal (Preliminary result)   Collection Time   05/23/12  9:13 AM      Component Value Range Status Comment  Specimen Description BLOOD LEFT HAND   Final    Special Requests BOTTLES DRAWN AEROBIC AND ANAEROBIC 10CC   Final    Culture  Setup Time 05/23/2012 15:31   Final    Culture     Final    Value:        BLOOD CULTURE RECEIVED NO GROWTH TO DATE CULTURE WILL BE HELD FOR 5 DAYS BEFORE ISSUING A FINAL NEGATIVE REPORT   Report Status PENDING   Incomplete   BODY FLUID CULTURE     Status: Normal (Preliminary result)   Collection Time   05/23/12 12:03 PM      Component Value Range Status Comment   Specimen Description PLEURAL FLUID RIGHT   Final    Special Requests   Final    Gram Stain     Final    Value: RARE WBC PRESENT, PREDOMINANTLY PMN     NO ORGANISMS SEEN   Culture NO GROWTH 2 DAYS   Final    Report Status PENDING   Incomplete   URINE CULTURE     Status: Normal   Collection Time   05/23/12  1:27 PM      Component Value Range Status Comment   Specimen Description URINE, CATHETERIZED   Final    Special Requests NONE   Final    Culture  Setup Time 05/23/2012 14:10   Final    Colony Count NO GROWTH   Final    Culture NO GROWTH   Final    Report Status 05/24/2012 FINAL   Final    MEDICATIONS: Reviewed by M.D.  Lonia Blood, MD [facility] Office   [phone] Pager [phone]  On-Call/Text Page:      [email or web address]      password [facility]  05/25/2012, 3:03 PM   LOS: 13 days

## 2012-05-25 NOTE — Progress Notes (Signed)
PARENTERAL NUTRITION CONSULT NOTE - FOLLOW UP  Pharmacy Consult: TPN Indication:  SBO  Allergies  Allergen Reactions  . Iohexol      Code: VOM, Desc: PT REPORTS VOMITING W/ IVP DYE- ARS 12/26/08, Onset Date: [id number]     Patient Measurements: Height: 5\' 10"  (177.8 cm) Weight: 185 lb 3 oz (84 kg) IBW/kg (Calculated) : 68.5  Adjusted Body Weight: 74 kg  Vital Signs: Temp: 97.4 F (36.3 C) (11/03 0736) Temp src: Oral (11/03 0736) BP: 128/83 mmHg (11/03 0740) Pulse Rate: 78  (11/03 0708) Intake/Output from previous day: 11/02 0701 - 11/03 0700 In: 750 [P.O.:480; I.V.:110; TPN:160] Out: 1756 [Urine:1700; Emesis/NG output:55; Stool:1]  Labs:  [facility] 05/25/12 0400 05/24/12 0448 05/23/12 0854  WBC 19.4* 22.8* 23.4*  HGB 8.6* 8.1* 8.9*  HCT 24.8* 23.0* 25.5*  PLT 415* 428* 391  APTT -- -- --  INR -- -- --     Basename 05/25/12 0400 05/24/12 0448 05/23/12 0459  NA 138 139 140  K 3.8 3.6 3.6  CL 104 105 105  CO2 29 30 27   GLUCOSE 124* 148* 204*  BUN 18 18 17   CREATININE 0.86 0.84 0.78  LABCREA -- -- --  CREAT24HRUR -- -- --  CALCIUM 8.3* 8.3* 8.6  MG 1.8 2.0 1.8  PHOS 3.6 2.8 2.8  PROT -- 6.1 --  ALBUMIN -- 2.1* --  AST -- 14 --  ALT -- 7 --  ALKPHOS -- 88 --  BILITOT -- 0.6 --  BILIDIR -- -- --  IBILI -- -- --  PREALBUMIN -- -- --  TRIG -- -- --  CHOLHDL -- -- --  CHOL -- -- --   Estimated Creatinine Clearance: 62.6 ml/min (by C-G formula based on Cr of 0.86).    Basename 05/25/12 0738 05/25/12 0447 05/24/12 2339  GLUCAP 131* 121* 131*    Insulin Requirements in the past 12 hours:  10 units from SSI + 80 units regular insulin in TPN  Current Nutrition:  TPN, clear liquids   Nutritional Goals:  1700-1900 kCal, 90-100 grams of protein per day  Assessment:  GI: s/p lap cholecystectomy 10/23 and ERCP with stent placement on 10/26. Having BMs, NG removed today, clear liquid diet ordered. Endo: hx hypothyroid on Synthroid.  hx DM2 on metformin and  glipizide PTA. CBGs are improved 121-132.  Note Lantus 10 units BID was added 10/31. Lytes: K has decreased (goal ~4 with ileus), Mag is slightly below goal of 2 and Phos is normal, but below goal of 4 in setting of ileus. Renal: Had some ARF post-op d/t hemodynamic renal injury, now resolved.  SCr 0.86, good UOP. NS available for KVO. Pulm: 2L [place]. S/p thoracentesis 11/1 for pleural effusion Cards: hx CHF (EF 45-50%) / Afib / HTN - on diltiazem drip, not an anticoagulation candidate d/t hematoma and possible need for repeat surgery. Not on warfarin PTA either.  BP mildly elevated, HR within goal Hepatobil: LFTs WNL. Chol and Trig nml. Prealbumin is low in setting of post-op and ongoing inflammation. Neuro: no issues ID: Cipro/Flagyl changed to Zosyn (d#5) for increased intraabdominal coverage. Cultures remain negative. Ongoing leukocytosis, pt remains afebrile. Best Practices: SCD for VTE prophylaxis, IV PPI TPN Access: PICC placed 10/27 TPN day#: 7  Plan:  Continue Clinimix E 5/15 at goal rate of 80 ml/hr. TPN at goal rate will provide 100% goal protein and 92% of goal (avg) Kcals.  Follow-up diet tolerance and taper TPN accordingly. Supplement lipids, multivitamins, and trace elements only  on M-W-F due to shortages Continue with 80 units of regular insulin in each bag of TPN.  Continue SSI at resistant scale and Lantus Check CMET, Mag, Phos, Triglycerides, and Prealbumin with AM labs Follow-up CBGs and insulin requirements  Estella Husk, Pharm.D., BCPS Clinical Pharmacist  Phone [phone] Pager [phone] 05/25/2012, 9:53 AM

## 2012-05-26 LAB — TRIGLYCERIDES: Triglycerides: 93 mg/dL (ref <150)

## 2012-05-26 LAB — PHOSPHORUS: Phosphorus: 3 mg/dL (ref 2.3–4.6)

## 2012-05-26 LAB — DIFFERENTIAL
Basophils Absolute: 0 10*3/uL (ref 0.0–0.1)
Basophils Relative: 0 % (ref 0–1)
Eosinophils Absolute: 0.2 10*3/uL (ref 0.0–0.7)
Eosinophils Relative: 1 % (ref 0–5)
Monocytes Absolute: 2.1 10*3/uL — ABNORMAL HIGH (ref 0.1–1.0)
Monocytes Relative: 12 % (ref 3–12)

## 2012-05-26 LAB — GLUCOSE, CAPILLARY
Glucose-Capillary: 136 mg/dL — ABNORMAL HIGH (ref 70–99)
Glucose-Capillary: 191 mg/dL — ABNORMAL HIGH (ref 70–99)
Glucose-Capillary: 127 mg/dL — ABNORMAL HIGH (ref 70–99)

## 2012-05-26 LAB — CBC
HCT: 24.7 % — ABNORMAL LOW (ref 36.0–46.0)
Hemoglobin: 8.5 g/dL — ABNORMAL LOW (ref 12.0–15.0)
MCH: 27.9 pg (ref 26.0–34.0)
MCHC: 34.4 g/dL (ref 30.0–36.0)
MCV: 81 fL (ref 78.0–100.0)
RDW: 15.5 % (ref 11.5–15.5)

## 2012-05-26 LAB — COMPREHENSIVE METABOLIC PANEL
AST: 18 U/L (ref 0–37)
Albumin: 1.9 g/dL — ABNORMAL LOW (ref 3.5–5.2)
Alkaline Phosphatase: 101 U/L (ref 39–117)
CO2: 30 mEq/L (ref 19–32)
Chloride: 103 mEq/L (ref 96–112)
Creatinine, Ser: 0.97 mg/dL (ref 0.50–1.10)
GFR calc non Af Amer: 54 mL/min — ABNORMAL LOW (ref >90)
Potassium: 4.1 mEq/L (ref 3.5–5.1)
Total Bilirubin: 0.5 mg/dL (ref 0.3–1.2)

## 2012-05-26 LAB — PATHOLOGIST SMEAR REVIEW

## 2012-05-26 MED ORDER — ATORVASTATIN CALCIUM 40 MG PO TABS
40.0000 mg | ORAL_TABLET | Freq: Every day | ORAL | Status: DC
  Administered 2012-05-26 – 2012-06-02 (×8): 40 mg via ORAL
  Filled 2012-05-26 (×8): qty 1

## 2012-05-26 MED ORDER — LEVOTHYROXINE SODIUM 50 MCG PO TABS
50.0000 ug | ORAL_TABLET | Freq: Every day | ORAL | Status: DC
  Administered 2012-05-27 – 2012-06-02 (×7): 50 ug via ORAL
  Filled 2012-05-26 (×8): qty 1

## 2012-05-26 MED ORDER — SODIUM GLYCEROPHOSPHATE 1 MMOLE/ML IV SOLN
10.0000 mmol | Freq: Once | INTRAVENOUS | Status: AC
  Administered 2012-05-26: 10 mmol via INTRAVENOUS
  Filled 2012-05-26: qty 10

## 2012-05-26 MED ORDER — DILTIAZEM HCL 60 MG PO TABS
60.0000 mg | ORAL_TABLET | Freq: Four times a day (QID) | ORAL | Status: AC
  Administered 2012-05-26 – 2012-05-28 (×10): 60 mg via ORAL
  Filled 2012-05-26 (×12): qty 1

## 2012-05-26 MED ORDER — FAT EMULSION 20 % IV EMUL
250.0000 mL | INTRAVENOUS | Status: AC
  Administered 2012-05-26: 250 mL via INTRAVENOUS
  Filled 2012-05-26: qty 250

## 2012-05-26 MED ORDER — ALLOPURINOL 100 MG PO TABS
100.0000 mg | ORAL_TABLET | Freq: Every day | ORAL | Status: DC
  Administered 2012-05-26 – 2012-06-02 (×8): 100 mg via ORAL
  Filled 2012-05-26 (×8): qty 1

## 2012-05-26 MED ORDER — POTASSIUM CHLORIDE ER 10 MEQ PO TBCR
10.0000 meq | EXTENDED_RELEASE_TABLET | Freq: Every day | ORAL | Status: DC
  Administered 2012-05-26 – 2012-06-02 (×8): 10 meq via ORAL
  Filled 2012-05-26 (×8): qty 1

## 2012-05-26 MED ORDER — ACETAMINOPHEN 325 MG PO TABS
650.0000 mg | ORAL_TABLET | Freq: Three times a day (TID) | ORAL | Status: DC
  Administered 2012-05-26: 650 mg via ORAL
  Filled 2012-05-26: qty 2

## 2012-05-26 MED ORDER — MAGNESIUM SULFATE IN D5W 10-5 MG/ML-% IV SOLN
1.0000 g | Freq: Once | INTRAVENOUS | Status: AC
  Administered 2012-05-26: 1 g via INTRAVENOUS
  Filled 2012-05-26: qty 100

## 2012-05-26 MED ORDER — FUROSEMIDE 40 MG PO TABS
40.0000 mg | ORAL_TABLET | Freq: Every day | ORAL | Status: DC
  Administered 2012-05-26 – 2012-06-02 (×8): 40 mg via ORAL
  Filled 2012-05-26 (×8): qty 1

## 2012-05-26 MED ORDER — GLIPIZIDE 5 MG PO TABS
5.0000 mg | ORAL_TABLET | Freq: Every day | ORAL | Status: DC
  Administered 2012-05-26 – 2012-05-27 (×2): 5 mg via ORAL
  Filled 2012-05-26 (×3): qty 1

## 2012-05-26 MED ORDER — METOPROLOL TARTRATE 50 MG PO TABS
50.0000 mg | ORAL_TABLET | Freq: Two times a day (BID) | ORAL | Status: DC
  Administered 2012-05-26 – 2012-06-02 (×15): 50 mg via ORAL
  Filled 2012-05-26 (×17): qty 1

## 2012-05-26 MED ORDER — ACETAMINOPHEN 325 MG PO TABS
650.0000 mg | ORAL_TABLET | ORAL | Status: DC | PRN
  Administered 2012-05-28 – 2012-05-30 (×4): 650 mg via ORAL
  Filled 2012-05-26 (×4): qty 2

## 2012-05-26 MED ORDER — DILTIAZEM HCL 90 MG PO TABS
90.0000 mg | ORAL_TABLET | Freq: Four times a day (QID) | ORAL | Status: DC
  Filled 2012-05-26 (×4): qty 1

## 2012-05-26 MED ORDER — TRACE MINERALS CR-CU-F-FE-I-MN-MO-SE-ZN IV SOLN
INTRAVENOUS | Status: AC
  Administered 2012-05-26: 18:00:00 via INTRAVENOUS
  Filled 2012-05-26: qty 2000

## 2012-05-26 MED ORDER — DILTIAZEM HCL 100 MG IV SOLR
5.0000 mg/h | INTRAVENOUS | Status: AC
  Filled 2012-05-26: qty 100

## 2012-05-26 NOTE — Progress Notes (Signed)
[facility] [place] TEAM 1 - Stepdown/ICU TEAM  Name: Nicole [name] Date of Birth: June 13, 1931 Admit Date: 05/12/2012 [id number], [name], DO  Brief Narrative: 78 year old female who was admitted on 05/12/2012 with 2-3 days of persistent right upper quadrant abdominal pain worse after eating. She had been evaluated in the emergency department 24 hours before admission and ultrasound revealed symptomatic cholelithiasis. She had been evaluated by surgery that time and plans were to proceed with elective cholecystectomy after patient stopped her Plavix and aspirin for one week. Unfortunately she returned to the ER because of uncontrolled pain with vomiting. sHe subsequently underwent a laparoscopic cholecystectomy on 05/14/2012. Postoperatively She developed severe abdominal pain associated with nausea and vomiting. HIDA scan revealed bile leak. In the interim she developed atrial fibrillation with RVR and was subsequentLY started on Cardizem drip. CT scan performed demonstrated small bowel obstruction that warranted NG tube placement and surgical consultation. She has subsequently undergone ERCP with stent placement on 05/17/2012.  Assessment/Plan:  Abdominal Distention 2/2 post operative partial SBO/ileus Care as per Gen Surgery - pt is improving nicely - continue to advance diet - now up to full liquids - continues to have multiple stools  Bile Leak -following Lap Cholecystectomy 10/23 -S/P ERCP with Stent placement 10/26 -Initially was tx'd with empiric Cipro and Flagyl - WBC counts were rising so surgery dc'd in favor of Zosyn   -CT abdomen and pelvis 05/22/2012 revealed near resolution of fluid collection near the liver and no evidence of abscess or other findings to explain leukocytosis  Abdominal Wall hematoma Hgb stable  Acute Renal Failure -Resolved -suspicion for hemodynamic mediated renal injury-as patient was very briefly hypotensive on 10/24-retrospectively-hypotension  probably was a result from developing abd wall hematoma/?intra-abdominal pathology  Afib with RVR -Transitioning today from a Cardizem drip to oral Cardizem and from scheduled IV BB to oral BB -not a candidate for anticoagulation given hematoma/potential for repeart surgery, more importantly has been deemed not a be candidate for anticoagulation by her cardiologist in the past as well - ?non compliance  Acute hypoxic Resp Failure due to : A) compressive atelectasis/mechanical obstruction secondary to abdominal distention/pleural effusion -stable and resolving as partial small bowel obstruction and abdominal distention resolve  B) bilateral pleural effusion -Status post thoracentesis 11/01 right lung with 470 cc of dark bloody fluid obtained -Fluid sent for culture and cytology -f/u CXR in AM to assess for re-accumulation  Persistent leukocytosis -Likely related to pulmonary processes with compressive atelectasis and bloody effusion -improving   Cholecystitis -s/p Lap cholecystectomy on 10/23 -unfortunately complicated by abdominal wall hematoma and bile leak-see above  Hypokalemia -2/2 to GI loss via NG suction -replete and recheck - goal is K+ of 4.0 or >  Hypernatremia Resolved w/ addition of free water  DM-uncontrolled -holding oral agents -CBGs better controlled   CHF (systolic EF 45% and diastolic dysfunction-grade 1) -compensated -moderate TR due to pulmonary HTN  HTN Much improved today - cont to follow  Thoracic Aortic Aneurysm outpatient follow up  H/O Stage III colon Ca outpatient follow up with Oncology  Disposition: Transfer to telemetry DVT Prophylaxis: scd's Code Status: Full code   Procedures:  Laparoscopic Cholecystectomy 05/14/12  ERCP 05/17/12  Right thoracentesis per interventional radiology 05/23/2012  CONSULTS: Cardiology - signed off Nephrology - signed off GI - signed off General Surgery Interventional radiology    Subjective: Endorses feels much better especially after passing multiple stools over the past 48 hours. Tolerating clear liquids without any difficulty including no  nausea or abdominal pain. States breathing has also improved. Still remains fatigued  PHYSICAL EXAM: Vital signs in last 24 hours: Filed Vitals:   05/25/12 2307 05/26/12 0010 05/26/12 0400 05/26/12 0800  BP: 138/69 150/78 145/69 147/86  Pulse: 78 77 40 96  Temp:  98.8 F (37.1 C) 98.6 F (37 C) 98 F (36.7 C)  TempSrc:  Oral Oral Oral  Resp: 22 23 23 23   Height:      Weight:      SpO2: 94% 97% 96% 97%   Weight change:  Body mass index is 26.57 kg/(m^2).   Chest: good air movement th/o - no wheeze  CVS: regular rate - irreg rhythm - no gallup or rub  Abdomen: Much less distended - BS+ - no rebound - no focal tenderness -having multiple bowel movements, parenteral nutrition infusing Extremities: no signif C/C/E B LE  Intake/Output from previous day:  Intake/Output Summary (Last 24 hours) at 05/26/12 1203 Last data filed at 05/26/12 1100  Gross per 24 hour  Intake 2836.83 ml  Output   2800 ml  Net  36.83 ml    LAB RESULTS: CBC  Lab 05/26/12 0510 05/25/12 0400 05/24/12 0448 05/23/12 0854 05/22/12 0800  WBC 17.6* 19.4* 22.8* 23.4* 31.4*  HGB 8.5* 8.6* 8.1* 8.9* 9.4*  HCT 24.7* 24.8* 23.0* 25.5* 26.5*  PLT 456* 415* 428* 391 392  MCV 81.0 80.8 81.9 81.2 81.5  MCH 27.9 28.0 28.8 28.3 28.9  MCHC 34.4 34.7 35.2 34.9 35.5  RDW 15.5 15.7* 15.7* 15.7* 15.6*  LYMPHSABS 1.2 -- -- -- --  MONOABS 2.1* -- -- -- --  EOSABS 0.2 -- -- -- --  BASOSABS 0.0 -- -- -- --  BANDABS -- -- -- -- --    Chemistries   Lab 05/26/12 0510 05/25/12 0400 05/24/12 0448 05/23/12 0459 05/22/12 0500  NA 138 138 139 140 140  K 4.1 3.8 3.6 3.6 4.2  CL 103 104 105 105 106  CO2 30 29 30 27 29   GLUCOSE 125* 124* 148* 204* 247*  BUN 17 18 18 17 16   CREATININE 0.97 0.86 0.84 0.78 0.86  CALCIUM 8.3* 8.3* 8.3* 8.6 8.7  MG 1.8 1.8 2.0  1.8 1.9    CBG:  Lab 05/26/12 0801 05/26/12 0358 05/26/12 0010 05/25/12 1930 05/25/12 1720  GLUCAP 127* 132* 152* 191* 119*   MICROBIOLOGY: Recent Results (from the past 240 hour(s))  CULTURE, BLOOD (ROUTINE X 2)     Status: Normal (Preliminary result)   Collection Time   05/23/12  8:54 AM      Component Value Range Status Comment   Specimen Description BLOOD LEFT ARM   Final    Special Requests BOTTLES DRAWN AEROBIC AND ANAEROBIC 10CC   Final    Culture  Setup Time 05/23/2012 15:31   Final    Culture     Final    Value:        BLOOD CULTURE RECEIVED NO GROWTH TO DATE CULTURE WILL BE HELD FOR 5 DAYS BEFORE ISSUING A FINAL NEGATIVE REPORT   Report Status PENDING   Incomplete   CULTURE, BLOOD (ROUTINE X 2)     Status: Normal (Preliminary result)   Collection Time   05/23/12  9:13 AM      Component Value Range Status Comment   Specimen Description BLOOD LEFT HAND   Final    Special Requests BOTTLES DRAWN AEROBIC AND ANAEROBIC 10CC   Final    Culture  Setup Time 05/23/2012 15:31  Final    Culture     Final    Value:        BLOOD CULTURE RECEIVED NO GROWTH TO DATE CULTURE WILL BE HELD FOR 5 DAYS BEFORE ISSUING A FINAL NEGATIVE REPORT   Report Status PENDING   Incomplete   BODY FLUID CULTURE     Status: Normal (Preliminary result)   Collection Time   05/23/12 12:03 PM      Component Value Range Status Comment   Specimen Description PLEURAL FLUID RIGHT   Final    Special Requests   Final    Gram Stain     Final    Value: RARE WBC PRESENT, PREDOMINANTLY PMN     NO ORGANISMS SEEN   Culture NO GROWTH 3 DAYS   Final    Report Status PENDING   Incomplete   URINE CULTURE     Status: Normal   Collection Time   05/23/12  1:27 PM      Component Value Range Status Comment   Specimen Description URINE, CATHETERIZED   Final    Special Requests NONE   Final    Culture  Setup Time 05/23/2012 14:10   Final    Colony Count NO GROWTH   Final    Culture NO GROWTH   Final    Report Status  05/24/2012 FINAL   Final    MEDICATIONS: Reviewed by M.D.  Junious Silk, ANP [facility] Office  [phone] Pager [phone]  On-Call/Text Page:      [email or web address]      password [facility]  05/26/2012, 12:03 PM   LOS: 14 days   I have personally examined this patient and reviewed the entire database. I have reviewed the above note, made any necessary editorial changes, and agree with its content.  Lonia Blood, MD [facility]

## 2012-05-26 NOTE — Progress Notes (Signed)
PARENTERAL NUTRITION CONSULT NOTE - FOLLOW UP  Pharmacy Consult: TPN Indication:  SBO  Allergies  Allergen Reactions  . Iohexol      Code: VOM, Desc: PT REPORTS VOMITING W/ IVP DYE- ARS 12/26/08, Onset Date: [id number]     Patient Measurements: Height: 5\' 10"  (177.8 cm) Weight: 185 lb 3 oz (84 kg) IBW/kg (Calculated) : 68.5  Adjusted Body Weight: 74 kg  Vital Signs: Temp: 98 F (36.7 C) (11/04 0800) Temp src: Oral (11/04 0800) BP: 147/86 mmHg (11/04 0800) Pulse Rate: 96  (11/04 0800) Intake/Output from previous day: 11/03 0701 - 11/04 0700 In: 3131.8 [P.O.:780; I.V.:1181.8; IV Piggyback:50; TPN:1120] Out: 3375 [Urine:3375]  Labs:  [facility] 05/26/12 0510 05/25/12 0400 05/24/12 0448  WBC 17.6* 19.4* 22.8*  HGB 8.5* 8.6* 8.1*  HCT 24.7* 24.8* 23.0*  PLT 456* 415* 428*  APTT -- -- --  INR -- -- --     Basename 05/26/12 0510 05/25/12 0400 05/24/12 0448  NA 138 138 139  K 4.1 3.8 3.6  CL 103 104 105  CO2 30 29 30   GLUCOSE 125* 124* 148*  BUN 17 18 18   CREATININE 0.97 0.86 0.84  LABCREA -- -- --  CREAT24HRUR -- -- --  CALCIUM 8.3* 8.3* 8.3*  MG 1.8 1.8 2.0  PHOS 3.0 3.6 2.8  PROT 6.1 -- 6.1  ALBUMIN 1.9* -- 2.1*  AST 18 -- 14  ALT 10 -- 7  ALKPHOS 101 -- 88  BILITOT 0.5 -- 0.6  BILIDIR -- -- --  IBILI -- -- --  PREALBUMIN -- -- --  TRIG 93 -- --  CHOLHDL -- -- --  CHOL 124 -- --   Estimated Creatinine Clearance: 55.5 ml/min (by C-G formula based on Cr of 0.97).    Basename 05/26/12 0801 05/26/12 0358 05/26/12 0010  GLUCAP 127* 132* 152*    Insulin Requirements in the past 12 hours:  10 units from SSI + 80 units regular insulin in TPN  Current Nutrition:  TPN, clear liquids   Nutritional Goals:  1700-1900 kCal, 90-100 grams of protein per day  Assessment:  GI: s/p lap cholecystectomy 10/23 and ERCP with stent placement on 10/26. Having BMs, NG removed 11/3, eating clear liquid diet. Endo: hx hypothyroid on Synthroid.  hx DM2 on metformin and  glipizide PTA. CBGs are improved 119-191.  Note Lantus 10 units BID was added 10/31. Lytes: K is at goal ~4 with ileus, Mag is slightly below goal of 2 and Phos is normal, but below goal of 4 in setting of ileus.  Corrected Ca = 10 - WNL. Renal: Had some ARF post-op d/t hemodynamic renal injury, now resolved.  SCr 0.97, good UOP. NS available for KVO. Pulm: S/p thoracentesis 11/1 for pleural effusion Cards: hx CHF (EF 45-50%) / Afib / HTN - on diltiazem drip, not an anticoagulation candidate d/t hematoma and possible need for repeat surgery. Not on warfarin PTA either.  BP mildly elevated, HR within goal.  Scheduled IV Labetalol added. Hepatobil: LFTs WNL. Chol and Trig nml. Prealbumin is pending. Neuro: no issues ID: Cipro/Flagyl changed to Zosyn (d#6) for increased intraabdominal coverage. Cultures remain negative. Ongoing leukocytosis, pt remains afebrile. Best Practices: SCD for VTE prophylaxis, IV PPI TPN Access: PICC placed 10/27 TPN day#: 8  Plan:  Continue Clinimix E 5/15 at goal rate of 80 ml/hr. TPN at goal rate will provide 100% goal protein and 92% of goal (avg) Kcals.  Follow-up diet tolerance and taper TPN accordingly. Supplement lipids, multivitamins, and trace  elements only on M-W-F due to shortages Continue with 80 units of regular insulin in each bag of TPN.  Continue SSI at resistant scale and Lantus Give Magnesium 1gm IV x 1 Give Sodium glycerophosphate IV x 1 Check BMET, Mag, and Phos with AM labs Follow-up pending prealbumin Follow-up CBGs and insulin requirements  Estella Husk, Pharm.D., BCPS Clinical Pharmacist  Phone [phone] Pager [phone] 05/26/2012, 8:51 AM

## 2012-05-26 NOTE — Progress Notes (Signed)
HOME HEALTH AGENCIES SERVING [place]   Agencies that are Medicare-Certified and are affiliated with [facility]  Telephone Number Address  [facility]   [facility] has ownership interest in this company; however, you are under no obligation to use this agency. [phone] or  [phone] [address] [place], [place] [address] [email or web address]   Agencies that are Medicare-Certified and are not affiliated with [facility]                                                                                 Home Health Agency Telephone Number Address  [facility] [phone] Fax [phone] [address], [address] [place], [place]  [address] [email or web address]  [facility] [phone] or [phone] Fax [phone] [address] [place], [place] [address] [email or web address]  [facility] [phone] Fax [phone] [address] [place], [place] [address] [email or web address]  [facility] [phone] Fax [phone] [address] [place], [place]  [address] [email or web address]  [facility] [phone] Fax [phone] [address], [address] [place], [place] [address] [email or web address]  [facility] [phone] [address] [place], [place] [address] [email or web address]  [facility] [phone]  [address] [place], [place] [address] [email or web address]  [facility] [phone] or [phone] Fax [phone] [address] [place], [place]  [address] [email or web address]  [facility] [phone] Fax [phone] [address] [place], [place]  [address]  [facility]  [phone] Fax [phone] [address] [place], [place] [address] [email or web address]       Agencies that are not Medicare-Certified and are not affiliated with [facility]   Home Health Agency Telephone Number Address  [facility] [phone] or [phone] Fax [phone] [address] [place], [place]  [address] [email or web address]  [facility] [phone] Fax [phone] [address] [place], [place]  [address] [email or web address]  [facility]  [phone] Fax [phone] [address] [place], [place] [address] [email or web address]  [facility] [phone] Fax [phone] [address] [place], [place] [address]  [facility] [phone] or [phone] Fax [phone] [address] [place], [place]  [address] [email or web address]  [facility] [phone] or [phone] Fax [phone] [address] [place], [place]  [address] [email or web address]  [facility] [phone] Fax [phone] [address] [place], [place]  [address] [email or web address]  [facility] [phone] [address] [place], [place]  [address]  [facility] [phone] Fax [phone] [address] [place], [place]  [address]  [facility] [phone] Fax [phone] [address] [place], [place]  [address] [email or web address]  [facility] [phone] Fax [phone] [address] [place], [place] [address] [email or web address]  [facility] [phone] Fax [phone] [address] [place], [place]  [address] [email or web address]    

## 2012-05-26 NOTE — Progress Notes (Signed)
Report called to receiving RN on 2000.  Pt transferred to 2028 via wheelchair with all belongings. Pt to call family member with new room number.    Roselie Awkward, RN

## 2012-05-26 NOTE — Progress Notes (Signed)
Hypoglycemic Event  CBG: 67  Treatment: 15 GM carbohydrate snack  Symptoms: None  Follow-up CBG: Time:2156 CBG Result:78  Possible Reasons for Event: Inadequate meal intake  Comments/MD notified:yes    Nicole [name]  Remember to initiate Hypoglycemia Order Set & complete

## 2012-05-26 NOTE — Progress Notes (Signed)
ANTIBIOTIC CONSULT NOTE - FOLLOW UP  Pharmacy Consult for Zosyn Indication: Peritonitis  Allergies  Allergen Reactions  . Iohexol      Code: VOM, Desc: PT REPORTS VOMITING W/ IVP DYE- ARS 12/26/08, Onset Date: [id number]    Microbiology: Recent Results (from the past 720 hour(s))  SURGICAL PCR SCREEN     Status: Abnormal   Collection Time   05/14/12  4:10 AM      Component Value Range Status Comment   MRSA, PCR NEGATIVE  NEGATIVE Final    Staphylococcus aureus POSITIVE (*) NEGATIVE Final   URINE CULTURE     Status: Normal   Collection Time   05/16/12  1:50 AM      Component Value Range Status Comment   Specimen Description URINE, CATHETERIZED   Final    Special Requests NONE   Final    Culture  Setup Time 05/16/2012 02:27   Final    Colony Count NO GROWTH   Final    Culture NO GROWTH   Final    Report Status 05/16/2012 FINAL   Final   CULTURE, BLOOD (ROUTINE X 2)     Status: Normal (Preliminary result)   Collection Time   05/23/12  8:54 AM      Component Value Range Status Comment   Specimen Description BLOOD LEFT ARM   Final    Special Requests BOTTLES DRAWN AEROBIC AND ANAEROBIC 10CC   Final    Culture  Setup Time 05/23/2012 15:31   Final    Culture     Final    Value:        BLOOD CULTURE RECEIVED NO GROWTH TO DATE CULTURE WILL BE HELD FOR 5 DAYS BEFORE ISSUING A FINAL NEGATIVE REPORT   Report Status PENDING   Incomplete   CULTURE, BLOOD (ROUTINE X 2)     Status: Normal (Preliminary result)   Collection Time   05/23/12  9:13 AM      Component Value Range Status Comment   Specimen Description BLOOD LEFT HAND   Final    Special Requests BOTTLES DRAWN AEROBIC AND ANAEROBIC 10CC   Final    Culture  Setup Time 05/23/2012 15:31   Final    Culture     Final    Value:        BLOOD CULTURE RECEIVED NO GROWTH TO DATE CULTURE WILL BE HELD FOR 5 DAYS BEFORE ISSUING A FINAL NEGATIVE REPORT   Report Status PENDING   Incomplete   BODY FLUID CULTURE     Status: Normal (Preliminary  result)   Collection Time   05/23/12 12:03 PM      Component Value Range Status Comment   Specimen Description PLEURAL FLUID RIGHT   Final    Special Requests   Final    Gram Stain     Final    Value: RARE WBC PRESENT, PREDOMINANTLY PMN     NO ORGANISMS SEEN   Culture NO GROWTH 2 DAYS   Final    Report Status PENDING   Incomplete   URINE CULTURE     Status: Normal   Collection Time   05/23/12  1:27 PM      Component Value Range Status Comment   Specimen Description URINE, CATHETERIZED   Final    Special Requests NONE   Final    Culture  Setup Time 05/23/2012 14:10   Final    Colony Count NO GROWTH   Final    Culture NO GROWTH  Final    Report Status 05/24/2012 FINAL   Final     Assessment: 78yo female on Zosyn for peritonitis, to broaden antibiotic coverage.  Today is day #6 of Zosyn with WBC continue trending down to 17.6 and pt is afebrile.  All cultures are negative. Renal function has been stable, est. crcl ~ 21ml/min  Goal of Therapy:  Resolution of infection  Plan:  1.  Continue Zosyn at current dosing 2.  F/U cultures  Bayard Hugger, PharmD, BCPS  Clinical Pharmacist  Pager: [phone]

## 2012-05-26 NOTE — Progress Notes (Signed)
Utilization review completed.  

## 2012-05-26 NOTE — Progress Notes (Signed)
ZANIYAH WERNETTE [id number] 1931/06/08   Subjective:  Feeling better C/o sore neck No new events Wants foley out now  Objective:  Vital signs:  Filed Vitals:   05/25/12 2307 05/26/12 0010 05/26/12 0400 05/26/12 0800  BP: 138/69 150/78 145/69 147/86  Pulse: 78 77 40 96  Temp:  98.8 F (37.1 C) 98.6 F (37 C) 98 F (36.7 C)  TempSrc:  Oral Oral Oral  Resp: 22 23 23 23   Height:      Weight:      SpO2: 94% 97% 96% 97%    Last BM Date: 05/26/12  Intake/Output   Yesterday:  11/03 0701 - 11/04 0700 In: 3131.8 [P.O.:780; I.V.:1181.8; IV Piggyback:50; TPN:1120] Out: 3375 [Urine:3375] This shift:  Total I/O In: 135 [I.V.:5; IV Piggyback:50; TPN:80] Out: -   Bowel function:  Flatus: y  BM: n  Physical Exam:  General: Pt awake/alert/oriented x4 in no acute distress Eyes: PERRL, normal EOM.  Sclera clear.  No icterus Neuro: CN II-XII intact w/o focal sensory/motor deficits. Lymph: No head/neck/groin lymphadenopathy Psych:  No delerium/psychosis/paranoia HENT: Normocephalic, Mucus membranes moist.  No thrush Neck: Supple, No tracheal deviation Chest: No chest wall pain w good excursion CV:  Pulses intact.  Regular rhythm MS: Normal AROM mjr joints.  No obvious deformity Abdomen: Soft/Obese  Mod distended.  Mildly tender at incisions only.  No incarcerated hernias.  Hematoma 20x20cm firm periumb but unchanged Ext:  SCDs BLE.  No mjr edema.  No cyanosis Skin: No petechiae / purpurae  Problem List:  Principal Problem:  *Cholecystitis chronic, acute, S/P lap cholecystectomy 05/14/13 complicated by bile leak, S/P ERCP Active Problems:  History of colon cancer, stage III  Thoracic aortic aneurysm, 4.8cm 2011  CHF, past NICM with an EF of 30%, most recent echo 2011 showed EF to be45- 50%  CAF not felt to be a Coumadin candidate in the past, rate currently controlled  Acute on chronic renal insufficiency, improved  Normal coronary arteries, 2008  Diabetes mellitus  HTN (hypertension)  Respiratory distress post op secondary to rapid AF  Ileus post op. still NPO   Assessment  [name]  78 y.o. female  9 Days Post-Op  Procedure(s): ENDOSCOPIC RETROGRADE CHOLANGIOPANCREATOGRAPHY (ERCP)  Ileus resolving  Plan:  -adv diet -transtition to PO cardizem per Card recs -DM per IM.  ?Start PO regimen low dose -transition to home meds -VTE prophylaxis- SCDs, etc -mobilize as tolerated to help recovery -prob OK to transfer to tele if IM feels can wean off dilt there (vs waiting until after off dilt gtt)  Ardeth Sportsman, M.D., F.A.C.S. Gastrointestinal and Minimally Invasive Surgery [facility] [address] [place], [place] [address] [phone] Main / Paging [phone] Voice Mail   05/26/2012  CARE TEAM:  PCP: Bufford Spikes, DO  Outpatient Care Team: Patient Care Team: Kermit Balo, DO as PCP - General (Geriatric Medicine) Currie Paris, MD as Consulting Physician (General Surgery)  Inpatient Treatment Team: Treatment Team: Attending Provider: Lonia Blood, MD; Consulting Physician: Bishop Limbo, MD; Registered Nurse: Jerene Dilling, RN; Registered Nurse: Cindra Eves, RN; Consulting Physician: Shirley Friar, MD; Consulting Physician: Barrie Folk, MD; Registered Nurse: Toya Smothers, RN; Rounding Team: Sheppard Penton, MD; Attending Physician: Calvert Cantor, MD; Dietitian: Hettie Holstein, RD; Physical Therapist: Kevan Ny, PT; Technician: Tana Felts, NT   Results:   Labs: Results for orders placed during the hospital encounter of 05/12/12 (  from the past 48 hour(s))  GLUCOSE, CAPILLARY     Status: Abnormal   Collection Time   05/24/12 11:50 AM      Component Value Range Comment   Glucose-Capillary 135 (*) 70 - 99 mg/dL    Comment 1 Notify RN     GLUCOSE, CAPILLARY     Status: Abnormal   Collection Time   05/24/12  4:18 PM      Component Value  Range Comment   Glucose-Capillary 129 (*) 70 - 99 mg/dL    Comment 1 Documented in Chart      Comment 2 Notify RN     GLUCOSE, CAPILLARY     Status: Abnormal   Collection Time   05/24/12  8:26 PM      Component Value Range Comment   Glucose-Capillary 132 (*) 70 - 99 mg/dL    Comment 1 Notify RN     GLUCOSE, CAPILLARY     Status: Abnormal   Collection Time   05/24/12 11:39 PM      Component Value Range Comment   Glucose-Capillary 131 (*) 70 - 99 mg/dL    Comment 1 Notify RN     BASIC METABOLIC PANEL     Status: Abnormal   Collection Time   05/25/12  4:00 AM      Component Value Range Comment   Sodium 138  135 - 145 mEq/L    Potassium 3.8  3.5 - 5.1 mEq/L    Chloride 104  96 - 112 mEq/L    CO2 29  19 - 32 mEq/L    Glucose, Bld 124 (*) 70 - 99 mg/dL    BUN 18  6 - 23 mg/dL    Creatinine, Ser 4.09  0.50 - 1.10 mg/dL    Calcium 8.3 (*) 8.4 - 10.5 mg/dL    GFR calc non Af Amer 63 (*) >90 mL/min    GFR calc Af Amer 73 (*) >90 mL/min   MAGNESIUM     Status: Normal   Collection Time   05/25/12  4:00 AM      Component Value Range Comment   Magnesium 1.8  1.5 - 2.5 mg/dL   PHOSPHORUS     Status: Normal   Collection Time   05/25/12  4:00 AM      Component Value Range Comment   Phosphorus 3.6  2.3 - 4.6 mg/dL   CBC     Status: Abnormal   Collection Time   05/25/12  4:00 AM      Component Value Range Comment   WBC 19.4 (*) 4.0 - 10.5 K/uL    RBC 3.07 (*) 3.87 - 5.11 MIL/uL    Hemoglobin 8.6 (*) 12.0 - 15.0 g/dL    HCT 81.1 (*) 91.4 - 46.0 %    MCV 80.8  78.0 - 100.0 fL    MCH 28.0  26.0 - 34.0 pg    MCHC 34.7  30.0 - 36.0 g/dL    RDW 78.2 (*) 95.6 - 15.5 %    Platelets 415 (*) 150 - 400 K/uL   GLUCOSE, CAPILLARY     Status: Abnormal   Collection Time   05/25/12  4:47 AM      Component Value Range Comment   Glucose-Capillary 121 (*) 70 - 99 mg/dL    Comment 1 Notify RN     GLUCOSE, CAPILLARY     Status: Abnormal   Collection Time   05/25/12  7:38 AM      Component Value  Range  Comment   Glucose-Capillary 131 (*) 70 - 99 mg/dL    Comment 1 Notify RN     GLUCOSE, CAPILLARY     Status: Abnormal   Collection Time   05/25/12 11:21 AM      Component Value Range Comment   Glucose-Capillary 215 (*) 70 - 99 mg/dL    Comment 1 Notify RN     GLUCOSE, CAPILLARY     Status: Abnormal   Collection Time   05/25/12  4:40 PM      Component Value Range Comment   Glucose-Capillary 136 (*) 70 - 99 mg/dL   GLUCOSE, CAPILLARY     Status: Abnormal   Collection Time   05/25/12  5:20 PM      Component Value Range Comment   Glucose-Capillary 119 (*) 70 - 99 mg/dL    Comment 1 Notify RN      Comment 2 Documented in Chart     GLUCOSE, CAPILLARY     Status: Abnormal   Collection Time   05/25/12  7:30 PM      Component Value Range Comment   Glucose-Capillary 191 (*) 70 - 99 mg/dL    Comment 1 Notify RN     GLUCOSE, CAPILLARY     Status: Abnormal   Collection Time   05/26/12 12:10 AM      Component Value Range Comment   Glucose-Capillary 152 (*) 70 - 99 mg/dL   GLUCOSE, CAPILLARY     Status: Abnormal   Collection Time   05/26/12  3:58 AM      Component Value Range Comment   Glucose-Capillary 132 (*) 70 - 99 mg/dL   COMPREHENSIVE METABOLIC PANEL     Status: Abnormal   Collection Time   05/26/12  5:10 AM      Component Value Range Comment   Sodium 138  135 - 145 mEq/L    Potassium 4.1  3.5 - 5.1 mEq/L    Chloride 103  96 - 112 mEq/L    CO2 30  19 - 32 mEq/L    Glucose, Bld 125 (*) 70 - 99 mg/dL    BUN 17  6 - 23 mg/dL    Creatinine, Ser 3.24  0.50 - 1.10 mg/dL    Calcium 8.3 (*) 8.4 - 10.5 mg/dL    Total Protein 6.1  6.0 - 8.3 g/dL    Albumin 1.9 (*) 3.5 - 5.2 g/dL    AST 18  0 - 37 U/L    ALT 10  0 - 35 U/L    Alkaline Phosphatase 101  39 - 117 U/L    Total Bilirubin 0.5  0.3 - 1.2 mg/dL    GFR calc non Af Amer 54 (*) >90 mL/min    GFR calc Af Amer 63 (*) >90 mL/min   MAGNESIUM     Status: Normal   Collection Time   05/26/12  5:10 AM      Component Value Range Comment     Magnesium 1.8  1.5 - 2.5 mg/dL   PHOSPHORUS     Status: Normal   Collection Time   05/26/12  5:10 AM      Component Value Range Comment   Phosphorus 3.0  2.3 - 4.6 mg/dL   CBC     Status: Abnormal   Collection Time   05/26/12  5:10 AM      Component Value Range Comment   WBC 17.6 (*) 4.0 - 10.5 K/uL    RBC 3.05 (*)  3.87 - 5.11 MIL/uL    Hemoglobin 8.5 (*) 12.0 - 15.0 g/dL    HCT 16.1 (*) 09.6 - 46.0 %    MCV 81.0  78.0 - 100.0 fL    MCH 27.9  26.0 - 34.0 pg    MCHC 34.4  30.0 - 36.0 g/dL    RDW 04.5  40.9 - 81.1 %    Platelets 456 (*) 150 - 400 K/uL   DIFFERENTIAL     Status: Abnormal   Collection Time   05/26/12  5:10 AM      Component Value Range Comment   Neutrophils Relative 80 (*) 43 - 77 %    Neutro Abs 14.0 (*) 1.7 - 7.7 K/uL    Lymphocytes Relative 7 (*) 12 - 46 %    Lymphs Abs 1.2  0.7 - 4.0 K/uL    Monocytes Relative 12  3 - 12 %    Monocytes Absolute 2.1 (*) 0.1 - 1.0 K/uL    Eosinophils Relative 1  0 - 5 %    Eosinophils Absolute 0.2  0.0 - 0.7 K/uL    Basophils Relative 0  0 - 1 %    Basophils Absolute 0.0  0.0 - 0.1 K/uL   CHOLESTEROL, TOTAL     Status: Normal   Collection Time   05/26/12  5:10 AM      Component Value Range Comment   Cholesterol 124  0 - 200 mg/dL   TRIGLYCERIDES     Status: Normal   Collection Time   05/26/12  5:10 AM      Component Value Range Comment   Triglycerides 93  <150 mg/dL   GLUCOSE, CAPILLARY     Status: Abnormal   Collection Time   05/26/12  8:01 AM      Component Value Range Comment   Glucose-Capillary 127 (*) 70 - 99 mg/dL     Imaging / Studies: No results found.  Medications / Allergies: per chart  Antibiotics: Anti-infectives     Start     Dose/Rate Route Frequency Ordered Stop   05/21/12 0900  piperacillin-tazobactam (ZOSYN) IVPB 3.375 g       3.375 g 12.5 mL/hr over 240 Minutes Intravenous Every 8 hours 05/21/12 0827     05/18/12 2200   ciprofloxacin (CIPRO) IVPB 400 mg  Status:  Discontinued        400  mg 200 mL/hr over 60 Minutes Intravenous Every 12 hours 05/18/12 1457 05/21/12 0759   05/16/12 1000   ciprofloxacin (CIPRO) IVPB 200 mg  Status:  Discontinued        200 mg 100 mL/hr over 60 Minutes Intravenous Every 24 hours 05/16/12 0846 05/18/12 1457   05/16/12 0800   metroNIDAZOLE (FLAGYL) IVPB 500 mg  Status:  Discontinued        500 mg 100 mL/hr over 60 Minutes Intravenous Every 8 hours 05/16/12 0735 05/21/12 0759   05/14/12 0600   ceFAZolin (ANCEF) IVPB 2 g/50 mL premix        2 g 100 mL/hr over 30 Minutes Intravenous On call to O.R. 05/13/12 1424 05/14/12 1407

## 2012-05-27 ENCOUNTER — Inpatient Hospital Stay (HOSPITAL_COMMUNITY): Payer: Medicare Other

## 2012-05-27 DIAGNOSIS — I10 Essential (primary) hypertension: Secondary | ICD-10-CM

## 2012-05-27 LAB — BODY FLUID CULTURE: Culture: NO GROWTH

## 2012-05-27 LAB — GLUCOSE, CAPILLARY
Glucose-Capillary: 148 mg/dL — ABNORMAL HIGH (ref 70–99)
Glucose-Capillary: 116 mg/dL — ABNORMAL HIGH (ref 70–99)
Glucose-Capillary: 102 mg/dL — ABNORMAL HIGH (ref 70–99)

## 2012-05-27 LAB — BASIC METABOLIC PANEL
CO2: 30 mEq/L (ref 19–32)
Calcium: 8.4 mg/dL (ref 8.4–10.5)
Chloride: 99 mEq/L (ref 96–112)
Glucose, Bld: 226 mg/dL — ABNORMAL HIGH (ref 70–99)
Sodium: 134 mEq/L — ABNORMAL LOW (ref 135–145)

## 2012-05-27 MED ORDER — SACCHAROMYCES BOULARDII 250 MG PO CAPS
250.0000 mg | ORAL_CAPSULE | Freq: Two times a day (BID) | ORAL | Status: DC
  Administered 2012-05-27 – 2012-06-02 (×13): 250 mg via ORAL
  Filled 2012-05-27 (×14): qty 1

## 2012-05-27 MED ORDER — GLIPIZIDE 2.5 MG HALF TABLET: 2.5000 mg | ORAL_TABLET | Freq: Every day | ORAL | Status: DC

## 2012-05-27 MED ORDER — ALTEPLASE 2 MG IJ SOLR
2.0000 mg | Freq: Once | INTRAMUSCULAR | Status: AC
  Administered 2012-05-27: 2 mg
  Filled 2012-05-27: qty 2

## 2012-05-27 MED ORDER — ENSURE PUDDING PO PUDG
1.0000 | Freq: Three times a day (TID) | ORAL | Status: DC
  Administered 2012-05-28 – 2012-06-02 (×12): 1 via ORAL

## 2012-05-27 MED ORDER — INSULIN ASPART 100 UNIT/ML ~~LOC~~ SOLN
0.0000 [IU] | Freq: Three times a day (TID) | SUBCUTANEOUS | Status: DC
Start: 2012-05-27 — End: 2012-06-02
  Administered 2012-05-27 – 2012-05-28 (×3): 1 [IU] via SUBCUTANEOUS
  Administered 2012-05-29 (×2): 2 [IU] via SUBCUTANEOUS
  Administered 2012-05-30 (×2): 1 [IU] via SUBCUTANEOUS
  Administered 2012-05-30: 100 [IU] via SUBCUTANEOUS
  Administered 2012-05-31: 1 [IU] via SUBCUTANEOUS
  Administered 2012-05-31 (×2): 2 [IU] via SUBCUTANEOUS
  Administered 2012-06-01: 1 [IU] via SUBCUTANEOUS
  Administered 2012-06-01 (×2): 2 [IU] via SUBCUTANEOUS
  Administered 2012-06-02: 3 [IU] via SUBCUTANEOUS
  Administered 2012-06-02: 7 [IU] via SUBCUTANEOUS

## 2012-05-27 MED ORDER — PSYLLIUM 95 % PO PACK
1.0000 | PACK | Freq: Two times a day (BID) | ORAL | Status: DC
  Administered 2012-05-27 – 2012-06-02 (×11): 1 via ORAL
  Filled 2012-05-27 (×14): qty 1

## 2012-05-27 MED ORDER — INSULIN ASPART 100 UNIT/ML ~~LOC~~ SOLN
0.0000 [IU] | Freq: Every day | SUBCUTANEOUS | Status: DC
  Administered 2012-06-01: 2 [IU] via SUBCUTANEOUS

## 2012-05-27 NOTE — Progress Notes (Signed)
Physical Therapy Treatment Patient Details Name: Nicole [name] MRN: [id number] DOB: 04-Nov-1930 Today's Date: 05/27/2012 Time: 8469-6295 PT Time Calculation (min): 33 min  PT Assessment / Plan / Recommendation Comments on Treatment Session  Nicole [name] was very motivated today. HR remained stable during session and O2 stable with oxygen during ambulation. Family plans to take shifts assisting her at home.     Follow Up Recommendations  Home health PT;Supervision for mobility/OOB     Does the patient have the potential to tolerate intense rehabilitation     Barriers to Discharge        Equipment Recommendations  Rolling walker with 5" wheels    Recommendations for Other Services    Frequency Min 3X/week   Plan Discharge plan remains appropriate;Frequency remains appropriate    Precautions / Restrictions Precautions Precautions: Fall Restrictions Weight Bearing Restrictions: No   Pertinent Vitals/Pain Attempted ambulation on RA however SaO2 decreased to 86% so O2 replaced at 2 liters and pt remained 90% or above    Mobility  Bed Mobility Bed Mobility: Sit to Supine Sit to Supine: 4: Min assist;HOB elevated (30 degrees) Details for Bed Mobility Assistance: minA to raise legs to bed and reposition for comfort Transfers Transfers: Sit to Stand;Stand to Sit Sit to Stand: 4: Min assist;With armrests;From chair/3-in-1;From toilet;With upper extremity assist Stand to Sit: To chair/3-in-1;To bed;With upper extremity assist;4: Min assist Details for Transfer Assistance: min v/c's and facilitation to bring trunk anteriorly over BOS, assist to stabilize in standing wtih cues for hand placement and set up in prep to sit Ambulation/Gait Ambulation/Gait Assistance: 4: Min guard Ambulation Distance (Feet): 160 Feet (with seated rest break after 80 ft, w/c follow) Assistive device: Rolling walker Ambulation/Gait Assistance Details: cues for tall posture and deep controlled breathing as  well as safe RW placement and negotiation specifically in tight spaces Gait Pattern: Trunk flexed     PT Goals Acute Rehab PT Goals PT Goal: Sit to Stand - Progress: Progressing toward goal PT Goal: Stand to Sit - Progress: Progressing toward goal PT Goal: Ambulate - Progress: Progressing toward goal  Visit Information  Last PT Received On: 05/27/12 Assistance Needed: +2 (lines and w/c follow)    Subjective Data  Subjective: I was just telling my son that I wanted to walk.  Patient Stated Goal: home   Cognition  Overall Cognitive Status: Appears within functional limits for tasks assessed/performed Arousal/Alertness: Awake/alert Orientation Level: Appears intact for tasks assessed Behavior During Session: [facility] for tasks performed Cognition - Other Comments: very pleasant    Balance     End of Session PT - End of Session Equipment Utilized During Treatment: Gait belt;Oxygen (attempted ambulation without O2 however pt's sats dec 86%) Activity Tolerance: Patient tolerated treatment well Patient left: in bed;with call bell/phone within reach   GP     [facility] HELEN 05/27/2012, 3:19 PM

## 2012-05-27 NOTE — Progress Notes (Signed)
Nicole [name] [id number] 12-02-1930   Subjective:  Feeling better Working crossword in chair No new events Bloated w food Low glc last night (73)  Objective:  Vital signs:  Filed Vitals:   05/26/12 0800 05/26/12 1230 05/26/12 2023 05/27/12 0442  BP: 147/86 154/81 153/81 129/77  Pulse: 96 71 80 73  Temp: 98 F (36.7 C) 97.6 F (36.4 C) 98.6 F (37 C) 98.8 F (37.1 C)  TempSrc: Oral Oral Oral Oral  Resp: 23 18 18 18   Height:      Weight:      SpO2: 97% 97% 98% 95%    Last BM Date: 05/27/12  Intake/Output   Yesterday:  11/04 0701 - 11/05 0700 In: 1325 [P.O.:840; I.V.:15; IV Piggyback:150; TPN:320] Out: 650 [Urine:650] This shift:     Bowel function:  Flatus: y  BM: Small loose  Physical Exam:  General: Pt awake/alert/oriented x4 in no acute distress Eyes: PERRL, normal EOM.  Sclera clear.  No icterus Neuro: CN II-XII intact w/o focal sensory/motor deficits. Lymph: No head/neck/groin lymphadenopathy Psych:  No delerium/psychosis/paranoia HENT: Normocephalic, Mucus membranes moist.  No thrush Neck: Supple, No tracheal deviation Chest: No chest wall pain w good excursion CV:  Pulses intact.  Regular rhythm MS: Normal AROM mjr joints.  No obvious deformity Abdomen: Soft/Obese  Mild distended.  No incarcerated hernias.  Hematoma 20x15cm firm mild TTP periumb Ext:  SCDs BLE.  No mjr edema.  No cyanosis Skin: No petechiae / purpurae  Problem List:  Principal Problem:  *Cholecystitis chronic, acute, S/P lap cholecystectomy 05/14/13 complicated by bile leak, S/P ERCP Active Problems:  History of colon cancer, stage III  Thoracic aortic aneurysm, 4.8cm 2011  CHF, past NICM with an EF of 30%, most recent echo 2011 showed EF to be45- 50%  CAF not felt to be a Coumadin candidate in the past, rate currently controlled  Acute on chronic renal insufficiency, improved  Normal coronary arteries, 2008  Diabetes mellitus  HTN (hypertension)  Respiratory distress  post op secondary to rapid AF  Ileus post op. still NPO   Assessment  Nicole [name]  78 y.o. female  10 Days Post-Op  Procedure(s): ENDOSCOPIC RETROGRADE CHOLANGIOPANCREATOGRAPHY (ERCP)  Ileus resolving  Plan:  -go back to fulls -bowel regimen -PO cardizem per Card recs -DM per IM.  ?Start PO regimen low dose - go down to 2.5? -follow WBC on Abx - suspect 3 more days of Zosyn -transition to home meds -VTE prophylaxis- SCDs, etc -mobilize as tolerated to help recovery -prob OK to transfer to tele if IM feels can wean off dilt there (vs waiting until after off dilt gtt)  Ardeth Sportsman, M.D., F.A.C.S. Gastrointestinal and Minimally Invasive Surgery [facility] [address] [place], [place] [address] [phone] Main / Paging [phone] Voice Mail   05/27/2012  CARE TEAM:  PCP: Bufford Spikes, DO  Outpatient Care Team: Patient Care Team: Kermit Balo, DO as PCP - General (Geriatric Medicine) Currie Paris, MD as Consulting Physician (General Surgery)  Inpatient Treatment Team: Treatment Team: Attending Provider: Kathlen Mody, MD; Consulting Physician: Bishop Limbo, MD; Registered Nurse: Jerene Dilling, RN; Registered Nurse: Cindra Eves, RN; Consulting Physician: Shirley Friar, MD; Consulting Physician: Barrie Folk, MD; Registered Nurse: Toya Smothers, RN; Attending Physician: Calvert Cantor, MD; Dietitian: Hettie Holstein, RD; Registered Nurse: Alger Simons, RN; Rounding Team: Dorothyann Gibbs, MD; Physical Therapist: Nevin Bloodgood, PT  Results:   Labs: Results for orders placed during the hospital encounter of 05/12/12 (from the past 48 hour(s))  GLUCOSE, CAPILLARY     Status: Abnormal   Collection Time   05/25/12 11:21 AM      Component Value Range Comment   Glucose-Capillary 215 (*) 70 - 99 mg/dL    Comment 1 Notify RN     GLUCOSE, CAPILLARY     Status: Abnormal   Collection  Time   05/25/12  4:40 PM      Component Value Range Comment   Glucose-Capillary 136 (*) 70 - 99 mg/dL   GLUCOSE, CAPILLARY     Status: Abnormal   Collection Time   05/25/12  5:20 PM      Component Value Range Comment   Glucose-Capillary 119 (*) 70 - 99 mg/dL    Comment 1 Notify RN      Comment 2 Documented in Chart     GLUCOSE, CAPILLARY     Status: Abnormal   Collection Time   05/25/12  7:30 PM      Component Value Range Comment   Glucose-Capillary 191 (*) 70 - 99 mg/dL    Comment 1 Notify RN     GLUCOSE, CAPILLARY     Status: Abnormal   Collection Time   05/26/12 12:10 AM      Component Value Range Comment   Glucose-Capillary 152 (*) 70 - 99 mg/dL   GLUCOSE, CAPILLARY     Status: Abnormal   Collection Time   05/26/12  3:58 AM      Component Value Range Comment   Glucose-Capillary 132 (*) 70 - 99 mg/dL   COMPREHENSIVE METABOLIC PANEL     Status: Abnormal   Collection Time   05/26/12  5:10 AM      Component Value Range Comment   Sodium 138  135 - 145 mEq/L    Potassium 4.1  3.5 - 5.1 mEq/L    Chloride 103  96 - 112 mEq/L    CO2 30  19 - 32 mEq/L    Glucose, Bld 125 (*) 70 - 99 mg/dL    BUN 17  6 - 23 mg/dL    Creatinine, Ser 6.96  0.50 - 1.10 mg/dL    Calcium 8.3 (*) 8.4 - 10.5 mg/dL    Total Protein 6.1  6.0 - 8.3 g/dL    Albumin 1.9 (*) 3.5 - 5.2 g/dL    AST 18  0 - 37 U/L    ALT 10  0 - 35 U/L    Alkaline Phosphatase 101  39 - 117 U/L    Total Bilirubin 0.5  0.3 - 1.2 mg/dL    GFR calc non Af Amer 54 (*) >90 mL/min    GFR calc Af Amer 63 (*) >90 mL/min   MAGNESIUM     Status: Normal   Collection Time   05/26/12  5:10 AM      Component Value Range Comment   Magnesium 1.8  1.5 - 2.5 mg/dL   PHOSPHORUS     Status: Normal   Collection Time   05/26/12  5:10 AM      Component Value Range Comment   Phosphorus 3.0  2.3 - 4.6 mg/dL   CBC     Status: Abnormal   Collection Time   05/26/12  5:10 AM      Component Value Range Comment   WBC 17.6 (*) 4.0 - 10.5 K/uL    RBC  3.05 (*) 3.87 - 5.11  MIL/uL    Hemoglobin 8.5 (*) 12.0 - 15.0 g/dL    HCT 16.1 (*) 09.6 - 46.0 %    MCV 81.0  78.0 - 100.0 fL    MCH 27.9  26.0 - 34.0 pg    MCHC 34.4  30.0 - 36.0 g/dL    RDW 04.5  40.9 - 81.1 %    Platelets 456 (*) 150 - 400 K/uL   DIFFERENTIAL     Status: Abnormal   Collection Time   05/26/12  5:10 AM      Component Value Range Comment   Neutrophils Relative 80 (*) 43 - 77 %    Neutro Abs 14.0 (*) 1.7 - 7.7 K/uL    Lymphocytes Relative 7 (*) 12 - 46 %    Lymphs Abs 1.2  0.7 - 4.0 K/uL    Monocytes Relative 12  3 - 12 %    Monocytes Absolute 2.1 (*) 0.1 - 1.0 K/uL    Eosinophils Relative 1  0 - 5 %    Eosinophils Absolute 0.2  0.0 - 0.7 K/uL    Basophils Relative 0  0 - 1 %    Basophils Absolute 0.0  0.0 - 0.1 K/uL   PREALBUMIN     Status: Abnormal   Collection Time   05/26/12  5:10 AM      Component Value Range Comment   Prealbumin 9.5 (*) 17.0 - 34.0 mg/dL   CHOLESTEROL, TOTAL     Status: Normal   Collection Time   05/26/12  5:10 AM      Component Value Range Comment   Cholesterol 124  0 - 200 mg/dL   TRIGLYCERIDES     Status: Normal   Collection Time   05/26/12  5:10 AM      Component Value Range Comment   Triglycerides 93  <150 mg/dL   GLUCOSE, CAPILLARY     Status: Abnormal   Collection Time   05/26/12  8:01 AM      Component Value Range Comment   Glucose-Capillary 127 (*) 70 - 99 mg/dL   GLUCOSE, CAPILLARY     Status: Abnormal   Collection Time   05/26/12 12:31 PM      Component Value Range Comment   Glucose-Capillary 133 (*) 70 - 99 mg/dL    Comment 1 Notify RN     GLUCOSE, CAPILLARY     Status: Abnormal   Collection Time   05/26/12  4:51 PM      Component Value Range Comment   Glucose-Capillary 65 (*) 70 - 99 mg/dL    Comment 1 Documented in Chart      Comment 2 Notify RN     GLUCOSE, CAPILLARY     Status: Abnormal   Collection Time   05/26/12  9:13 PM      Component Value Range Comment   Glucose-Capillary 67 (*) 70 - 99 mg/dL    Comment 1  Documented in Chart      Comment 2 Notify RN     GLUCOSE, CAPILLARY     Status: Normal   Collection Time   05/26/12  9:56 PM      Component Value Range Comment   Glucose-Capillary 78  70 - 99 mg/dL    Comment 1 Documented in Chart      Comment 2 Notify RN     GLUCOSE, CAPILLARY     Status: Abnormal   Collection Time   05/27/12  5:52 AM      Component  Value Range Comment   Glucose-Capillary 108 (*) 70 - 99 mg/dL     Imaging / Studies: Dg Chest Port 1 View  05/27/2012  *RADIOLOGY REPORT*  Clinical Data: Right pleural effusion.  PORTABLE CHEST - 1 VIEW  Comparison: Chest x-ray 05/23/2012.  Findings: There is a right upper extremity PICC with tip terminating in the mid superior vena cava.  Nasogastric tube has been removed.  Lung volumes remain low.  There are worsening bibasilar opacities that may reflect areas of atelectasis and/or consolidation.  Small bilateral pleural effusions (right greater than left), increasing on the right.  Pulmonary vascular crowding, without frank pulmonary edema.  Heart size is mildly enlarged (unchanged). The patient is rotated to the left on today's exam, resulting in distortion of the mediastinal contours and reduced diagnostic sensitivity and specificity for mediastinal pathology. Atherosclerosis in the thoracic aorta.  IMPRESSION: 1.  Support apparatus, as above. 2.  Worsening bibasilar aeration compatible with increasing bibasilar atelectasis and/or consolidation and increasing bilateral pleural effusions (particularly on the right). 3.  Atherosclerosis.   Original Report Authenticated By: Trudie [name], M.D.     Medications / Allergies: per chart  Antibiotics: Anti-infectives     Start     Dose/Rate Route Frequency Ordered Stop   05/21/12 0900  piperacillin-tazobactam (ZOSYN) IVPB 3.375 g       3.375 g 12.5 mL/hr over 240 Minutes Intravenous Every 8 hours 05/21/12 0827 05/28/12 0859   05/18/12 2200   ciprofloxacin (CIPRO) IVPB 400 mg  Status:   Discontinued        400 mg 200 mL/hr over 60 Minutes Intravenous Every 12 hours 05/18/12 1457 05/21/12 0759   05/16/12 1000   ciprofloxacin (CIPRO) IVPB 200 mg  Status:  Discontinued        200 mg 100 mL/hr over 60 Minutes Intravenous Every 24 hours 05/16/12 0846 05/18/12 1457   05/16/12 0800   metroNIDAZOLE (FLAGYL) IVPB 500 mg  Status:  Discontinued        500 mg 100 mL/hr over 60 Minutes Intravenous Every 8 hours 05/16/12 0735 05/21/12 0759   05/14/12 0600   ceFAZolin (ANCEF) IVPB 2 g/50 mL premix        2 g 100 mL/hr over 30 Minutes Intravenous On call to O.R. 05/13/12 1424 05/14/12 1407

## 2012-05-27 NOTE — Progress Notes (Signed)
Physical Therapy Progress Note   05/26/12 1000  PT Visit Information  Last PT Received On 05/26/12  Assistance Needed +1  PT Time Calculation  PT Start Time 0939  PT Stop Time 1009  PT Time Calculation (min) 30 min  Subjective Data  Subjective "I need to use the bathroom right now."  Patient Stated Goal get well to go home  Precautions  Precautions Fall  Cognition  Overall Cognitive Status Appears within functional limits for tasks assessed/performed  Arousal/Alertness Awake/alert  Orientation Level Appears intact for tasks assessed  Behavior During Session [facility] for tasks performed  Bed Mobility  Bed Mobility Sit to Supine  Sit to Supine 4: Min assist  Details for Bed Mobility Assistance (A) with left LE into bed with cues for technique  Transfers  Transfers Sit to Stand;Stand to Sit  Sit to Stand 4: Min assist;With upper extremity assist;From bed  Stand to Sit 4: Min guard;With upper extremity assist;To chair/3-in-1  Details for Transfer Assistance (A) to initiate transfer from lower surfaces with max cues for hand placement.  Ambulation/Gait  Ambulation/Gait Assistance 4: Min guard  Ambulation Distance (Feet) 30 Feet  Assistive device Rolling walker  Ambulation/Gait Assistance Details Minguard for safety with cues for proper RW placement  Gait Pattern Step-through pattern;Decreased stride length;Trunk flexed  Balance  Balance Assessed Yes  Dynamic Standing Balance  Dynamic Standing - Balance Support No upper extremity supported;During functional activity  Dynamic Standing - Level of Assistance 5: Stand by assistance  Dynamic Standing - Balance Activities Forward lean/weight shifting;Reaching for objects  Dynamic Standing - Comments Minguard for safety during functional activites.  Pt needs extra to complete task due to balance and dizziness  PT - End of Session  Equipment Utilized During Treatment Gait belt  Activity Tolerance Patient limited by fatigue  Patient left in  bed;with call bell/phone within reach  Nurse Communication Mobility status  PT - Assessment/Plan  Comments on Treatment Session Pt limited this due to overall fatigue and c/o dizziness during upright stance.  Pts SaO2 decreased to 88% during functional activity on RA and HR elevated to 144 during activity.  PT Plan Discharge plan remains appropriate;Frequency remains appropriate  PT Frequency Min 3X/week  Follow Up Recommendations Home health PT;Supervision - Intermittent  Equipment Recommended Rolling walker with 5" wheels  Acute Rehab PT Goals  PT Goal Formulation With patient  Time For Goal Achievement 06/05/12  Potential to Achieve Goals Good  Pt will go Sit to Stand with modified independence  PT Goal: Sit to Stand - Progress Progressing toward goal  Pt will go Stand to Sit with modified independence  PT Goal: Stand to Sit - Progress Progressing toward goal  Pt will Ambulate >150 feet;with modified independence;with least restrictive assistive device  PT Goal: Ambulate - Progress Progressing toward goal  PT General Charges  $$ ACUTE PT VISIT 1 Procedure  PT Treatments  $Gait Training 8-22 mins  $Therapeutic Activity 8-22 mins    No c/o pain.  [place], [place] DPT [phone]

## 2012-05-27 NOTE — Progress Notes (Addendum)
Nutrition Follow-up  Intervention:    Ensure Pudding supplement 3 times daily with meals (170 kcals, 4 gm protein per 4 oz cup) RD to follow for nutrition care plan  Assessment:   Patient states her appetite is OK. NGT discontinued 11/3. TPN discontinued. PO intake 25% this AM per flowsheet records. + BM's. Would benefit from addition of nutritional supplement -- RD to order.  Diet Order:  Full Liquids -- pudding thick liquids  Meds: Scheduled Meds:   . allopurinol  100 mg Oral Daily  . [COMPLETED] alteplase  2 mg Intracatheter Once  . [COMPLETED] alteplase  2 mg Intracatheter Once  . atorvastatin  40 mg Oral Daily  . diltiazem  60 mg Oral Q6H  . furosemide  40 mg Oral Daily  . insulin aspart  0-5 Units Subcutaneous QHS  . insulin aspart  0-9 Units Subcutaneous TID WC  . levothyroxine  50 mcg Oral Q breakfast  . lip balm   Topical BID  . metoprolol tartrate  50 mg Oral BID  . piperacillin-tazobactam (ZOSYN)  IV  3.375 g Intravenous Q8H  . potassium chloride  10 mEq Oral Daily  . psyllium  1 packet Oral BID  . saccharomyces boulardii  250 mg Oral BID  . sodium chloride  10-40 mL Intracatheter Q12H  . [COMPLETED] sodium glycerophosphate 0.9% NaCl IVPB  10 mmol Intravenous Once  . [DISCONTINUED] acetaminophen  650 mg Oral TID  . [DISCONTINUED] glipiZIDE  2.5 mg Oral Daily  . [DISCONTINUED] glipiZIDE  5 mg Oral Daily  . [DISCONTINUED] insulin aspart  0-20 Units Subcutaneous TID WC  . [DISCONTINUED] insulin glargine  10 Units Subcutaneous BID   Continuous Infusions:   . sodium chloride Stopped (05/23/12 2100)  . [EXPIRED] TPN (CLINIMIX) +/- additives 80 mL/hr at 05/26/12 1742   And  . [EXPIRED] fat emulsion 250 mL (05/26/12 1742)  . [EXPIRED] TPN (CLINIMIX) +/- additives 80 mL/hr at 05/26/12 1100   PRN Meds:.acetaminophen, alum & mag hydroxide-simeth, bisacodyl, hydrALAZINE, morphine injection, ondansetron (ZOFRAN) IV, phenol, promethazine, simethicone, sodium chloride    CMP     Component Value Date/Time   NA 134* 05/27/2012 0500   K 3.9 05/27/2012 0500   CL 99 05/27/2012 0500   CO2 30 05/27/2012 0500   GLUCOSE 226* 05/27/2012 0500   BUN 16 05/27/2012 0500   CREATININE 0.82 05/27/2012 0500   CALCIUM 8.4 05/27/2012 0500   PROT 6.1 05/26/2012 0510   ALBUMIN 1.9* 05/26/2012 0510   AST 18 05/26/2012 0510   ALT 10 05/26/2012 0510   ALKPHOS 101 05/26/2012 0510   BILITOT 0.5 05/26/2012 0510   GFRNONAA 66* 05/27/2012 0500   GFRAA 77* 05/27/2012 0500    CBG (last 3)   Basename 05/27/12 0552 05/26/12 2156 05/26/12 2113  GLUCAP 108* 78 67*     Intake/Output Summary (Last 24 hours) at 05/27/12 1402 Last data filed at 05/27/12 1300  Gross per 24 hour  Intake    840 ml  Output   1501 ml  Net   -661 ml    Weight Status:  84 kg (10/25) -- no new weight available  Estimated needs:  1700-1900 kcals, 95-100 gm protein  Nutrition Dx:  Inadequate Oral Intake r/t altered GI function as evidenced by PO intake 25%, ongoing  New Goal:  Oral intake with meals & supplements to meet >/= 90% of estimated nutrition needs, unmet  Monitor:  PO & supplemental intake, weight, labs, I/O's  [place] Hun, RD, LDN Pager #: [phone] After-Hours  Pager #: [phone]

## 2012-05-27 NOTE — Progress Notes (Signed)
[facility]   Name: Nicole [name] Date of Birth: Jun 15, 1931 Admit Date: 05/12/2012 GNF:AOZH, [name], DO  Brief Narrative: 78 year old female who was admitted on 05/12/2012 with 2-3 days of persistent right upper quadrant abdominal pain worse after eating. She had been evaluated in the emergency department 24 hours before admission and ultrasound revealed symptomatic cholelithiasis. She had been evaluated by surgery that time and plans were to proceed with elective cholecystectomy after patient stopped her Plavix and aspirin for one week. Unfortunately she returned to the ER because of uncontrolled pain with vomiting. sHe subsequently underwent a laparoscopic cholecystectomy on 05/14/2012. Postoperatively She developed severe abdominal pain associated with nausea and vomiting. HIDA scan revealed bile leak. In the interim she developed atrial fibrillation with RVR and was subsequentLY started on Cardizem drip. CT scan performed demonstrated small bowel obstruction that warranted NG tube placement and surgical consultation. She has subsequently undergone ERCP with stent placement on 05/17/2012, has been in step down till 11/4 and is transferred to tele on 11/5 for further management.   Assessment/Plan:  Abdominal Distention 2/2 post operative partial SBO/ileus Care as per Gen Surgery - pt is improving nicely - continue to advance diet - now up to full liquids - continues to have multiple stools  Bile Leak -following Lap Cholecystectomy 10/23 -S/P ERCP with Stent placement 10/26 -Initially was tx'd with empiric Cipro and Flagyl - WBC counts were rising so surgery dc'd in favor of Zosyn   -CT abdomen and pelvis 05/22/2012 revealed near resolution of fluid collection near the liver and no evidence of abscess or other findings to explain leukocytosis  Abdominal Wall hematoma Hgb stable  Acute Renal Failure -Resolved -suspicion for hemodynamic mediated renal injury-as patient was very  briefly hypotensive on 10/24-retrospectively-hypotension probably was a result from developing abd wall hematoma/?intra-abdominal pathology  Afib with RVR -Transitioned yesterday  from a Cardizem drip to oral Cardizem and from scheduled IV BB to oral BB -not a candidate for anticoagulation given hematoma/potential for repeart surgery, more importantly has been deemed not a be candidate for anticoagulation by her cardiologist in the past as well - ?non compliance - She is currently on cardizem 60 mg every 6 hours and metoprolol 50 mg twice daily. Her rate is better controlled today. Plan to change the cardizem in am to once daily dosing if her rate remains controlled.   Acute hypoxic Resp Failure due to :  A) compressive atelectasis/mechanical obstruction secondary to abdominal distention/pleural effusion -stable and resolving as partial small bowel obstruction and abdominal distention resolve  B) bilateral pleural effusion -Status post thoracentesis 11/01 right lung with 470 cc of dark bloody fluid obtained -Fluid sent for culture and cytology -f/u CXR today showed worsening of the bibasilar aeration possibly atelectasis or consolidation and increasing effusions. Patient remains afebrile and her leukocytosis is improving. She is already on zosyn .   Persistent leukocytosis -Likely related to pulmonary processes with compressive atelectasis and bloody effusion -improving   Cholecystitis -s/p Lap cholecystectomy on 10/23 -unfortunately complicated by abdominal wall hematoma and bile leak-see above  Hypokalemia -2/2 to GI loss via NG suction -replete and recheck - goal is K+ of 4.0 or >  Hypernatremia Resolved w/ addition of free water  DM-uncontrolled -holding oral agents for an episode of hypoglycemia over night.  -CBGs better controlled today. CBG (last 3)   Basename 05/27/12 0552 05/26/12 2156 05/26/12 2113  GLUCAP 108* 78 67*      CHF (systolic EF 45% and diastolic  dysfunction-grade 1) -  compensated -moderate TR due to pulmonary HTN  An episode of Hypoglycemia over night: probably secondary to inadequate meal intake. D/c glipizide. Changed the SSI coverage to sensitive scale. As her intake improves we can resume glipizide and change the SSI.   Hypothyroidism: resume synthroid.   HTN Much improved today - cont to follow  Thoracic Aortic Aneurysm outpatient follow up  H/O Stage III colon Ca outpatient follow up with Oncology  Disposition: pending PT/OT eval.  DVT Prophylaxis: scd's Code Status: Full code   Procedures:  Laparoscopic Cholecystectomy 05/14/12  ERCP 05/17/12  Right thoracentesis per interventional radiology 05/23/2012  CONSULTS: Cardiology - signed off Nephrology - signed off GI - signed off General Surgery Interventional radiology   Subjective: Tolerating liquid diet today. Advance as tolerated.   PHYSICAL EXAM: Vital signs in last 24 hours: Filed Vitals:   05/26/12 0800 05/26/12 1230 05/26/12 2023 05/27/12 0442  BP: 147/86 154/81 153/81 129/77  Pulse: 96 71 80 73  Temp: 98 F (36.7 C) 97.6 F (36.4 C) 98.6 F (37 C) 98.8 F (37.1 C)  TempSrc: Oral Oral Oral Oral  Resp: 23 18 18 18   Height:      Weight:      SpO2: 97% 97% 98% 95%   Weight change:  Body mass index is 26.57 kg/(m^2).   Chest: good air movement th/o - no wheeze  CVS: regular rate - irreg rhythm - no gallup or rub  Abdomen: Much less distended - BS+ - no rebound - no focal tenderness -having multiple bowel movements, parenteral nutrition infusing Extremities: no signif C/C/E B LE  Intake/Output from previous day:  Intake/Output Summary (Last 24 hours) at 05/27/12 1006 Last data filed at 05/27/12 0300  Gross per 24 hour  Intake   1020 ml  Output    650 ml  Net    370 ml    LAB RESULTS: CBC  Lab 05/26/12 0510 05/25/12 0400 05/24/12 0448 05/23/12 0854 05/22/12 0800  WBC 17.6* 19.4* 22.8* 23.4* 31.4*  HGB 8.5* 8.6* 8.1* 8.9*  9.4*  HCT 24.7* 24.8* 23.0* 25.5* 26.5*  PLT 456* 415* 428* 391 392  MCV 81.0 80.8 81.9 81.2 81.5  MCH 27.9 28.0 28.8 28.3 28.9  MCHC 34.4 34.7 35.2 34.9 35.5  RDW 15.5 15.7* 15.7* 15.7* 15.6*  LYMPHSABS 1.2 -- -- -- --  MONOABS 2.1* -- -- -- --  EOSABS 0.2 -- -- -- --  BASOSABS 0.0 -- -- -- --  BANDABS -- -- -- -- --    Chemistries   Lab 05/27/12 0500 05/26/12 0510 05/25/12 0400 05/24/12 0448 05/23/12 0459  NA 134* 138 138 139 140  K 3.9 4.1 3.8 3.6 3.6  CL 99 103 104 105 105  CO2 30 30 29 30 27   GLUCOSE 226* 125* 124* 148* 204*  BUN 16 17 18 18 17   CREATININE 0.82 0.97 0.86 0.84 0.78  CALCIUM 8.4 8.3* 8.3* 8.3* 8.6  MG 1.7 1.8 1.8 2.0 1.8    CBG:  Lab 05/27/12 0552 05/26/12 2156 05/26/12 2113 05/26/12 1651 05/26/12 1231  GLUCAP 108* 78 67* 65* 133*   MICROBIOLOGY: Recent Results (from the past 240 hour(s))  CULTURE, BLOOD (ROUTINE X 2)     Status: Normal (Preliminary result)   Collection Time   05/23/12  8:54 AM      Component Value Range Status Comment   Specimen Description BLOOD LEFT ARM   Final    Special Requests BOTTLES DRAWN AEROBIC AND ANAEROBIC 10CC   Final  Culture  Setup Time 05/23/2012 15:31   Final    Culture     Final    Value:        BLOOD CULTURE RECEIVED NO GROWTH TO DATE CULTURE WILL BE HELD FOR 5 DAYS BEFORE ISSUING A FINAL NEGATIVE REPORT   Report Status PENDING   Incomplete   CULTURE, BLOOD (ROUTINE X 2)     Status: Normal (Preliminary result)   Collection Time   05/23/12  9:13 AM      Component Value Range Status Comment   Specimen Description BLOOD LEFT HAND   Final    Special Requests BOTTLES DRAWN AEROBIC AND ANAEROBIC 10CC   Final    Culture  Setup Time 05/23/2012 15:31   Final    Culture     Final    Value:        BLOOD CULTURE RECEIVED NO GROWTH TO DATE CULTURE WILL BE HELD FOR 5 DAYS BEFORE ISSUING A FINAL NEGATIVE REPORT   Report Status PENDING   Incomplete   BODY FLUID CULTURE     Status: Normal (Preliminary result)   Collection  Time   05/23/12 12:03 PM      Component Value Range Status Comment   Specimen Description PLEURAL FLUID RIGHT   Final    Special Requests   Final    Gram Stain     Final    Value: RARE WBC PRESENT, PREDOMINANTLY PMN     NO ORGANISMS SEEN   Culture NO GROWTH 3 DAYS   Final    Report Status PENDING   Incomplete   URINE CULTURE     Status: Normal   Collection Time   05/23/12  1:27 PM      Component Value Range Status Comment   Specimen Description URINE, CATHETERIZED   Final    Special Requests NONE   Final    Culture  Setup Time 05/23/2012 14:10   Final    Colony Count NO GROWTH   Final    Culture NO GROWTH   Final    Report Status 05/24/2012 FINAL   Final      Shala Baumbach  [facility]  If 8PM-8AM, please contact night-coverage at [email or web address], password [facility]  05/27/2012, 10:06 AM   LOS: 15 days

## 2012-05-28 LAB — CBC
Hemoglobin: 7.7 g/dL — ABNORMAL LOW (ref 12.0–15.0)
MCH: 28.3 pg (ref 26.0–34.0)
Platelets: 541 10*3/uL — ABNORMAL HIGH (ref 150–400)
RBC: 2.72 MIL/uL — ABNORMAL LOW (ref 3.87–5.11)
WBC: 14.2 10*3/uL — ABNORMAL HIGH (ref 4.0–10.5)

## 2012-05-28 LAB — COMPREHENSIVE METABOLIC PANEL
ALT: 20 U/L (ref 0–35)
AST: 29 U/L (ref 0–37)
Alkaline Phosphatase: 115 U/L (ref 39–117)
CO2: 29 mEq/L (ref 19–32)
Calcium: 8.2 mg/dL — ABNORMAL LOW (ref 8.4–10.5)
Chloride: 103 mEq/L (ref 96–112)
GFR calc Af Amer: 68 mL/min — ABNORMAL LOW (ref >90)
GFR calc non Af Amer: 58 mL/min — ABNORMAL LOW (ref >90)
Glucose, Bld: 138 mg/dL — ABNORMAL HIGH (ref 70–99)
Potassium: 4.2 mEq/L (ref 3.5–5.1)
Sodium: 139 mEq/L (ref 135–145)

## 2012-05-28 LAB — GLUCOSE, CAPILLARY
Glucose-Capillary: 127 mg/dL — ABNORMAL HIGH (ref 70–99)
Glucose-Capillary: 119 mg/dL — ABNORMAL HIGH (ref 70–99)
Glucose-Capillary: 131 mg/dL — ABNORMAL HIGH (ref 70–99)

## 2012-05-28 MED ORDER — DILTIAZEM HCL ER COATED BEADS 240 MG PO CP24
240.0000 mg | ORAL_CAPSULE | Freq: Every day | ORAL | Status: DC
  Administered 2012-05-29 – 2012-06-02 (×5): 240 mg via ORAL
  Filled 2012-05-28 (×5): qty 1

## 2012-05-28 NOTE — Progress Notes (Signed)
11 Days Post-Op  Subjective: Patient states that she feels " short of breath with exertion" (? Deconditioning vs cardiac) At present she does not appear to be in any acute distress BS are clear bilaterally. No pedal edema.  Objective: Vital signs in last 24 hours: Temp:  [97.8 F (36.6 C)-98.5 F (36.9 C)] 97.8 F (36.6 C) (11/06 0557) Pulse Rate:  [75-97] 85  (11/06 0943) Resp:  [18-20] 20  (11/06 0557) BP: (127-152)/(68-87) 152/68 mmHg (11/06 0943) SpO2:  [86 %-96 %] 96 % (11/06 0557) Last BM Date: 05/28/12  Intake/Output from previous day: 11/05 0701 - 11/06 0700 In: 720 [P.O.:720] Out: 1152 [Urine:1150; Stool:2] Intake/Output this shift:    General appearance: alert, cooperative, appears stated age and no distress Chest: CTA bilaterally Cardiac: RRR No M/R/G  Abdomen: distended, tympanic, minimal BS + BM, + flatus No N/V reported has been tolerating full liquid diet. Extremities: + pulses, no edema or tenderness. Warm to touch. VSS, afebrile. Labs: Wbc trending down, H&H down slightly, plts stable. Bun slight improved and creat slight increase. BGS 138.  Lab Results:   [facility] 05/28/12 0449 05/26/12 0510  WBC 14.2* 17.6*  HGB 7.7* 8.5*  HCT 22.4* 24.7*  PLT 541* 456*   BMET  Basename 05/28/12 0449 05/27/12 0500  NA 139 134*  K 4.2 3.9  CL 103 99  CO2 29 30  GLUCOSE 138* 226*  BUN 15 16  CREATININE 0.91 0.82  CALCIUM 8.2* 8.4   PT/INR No results found for this basename: LABPROT:2,INR:2 in the last 72 hours ABG No results found for this basename: PHART:2,PCO2:2,PO2:2,HCO3:2 in the last 72 hours  Studies/Results: Dg Chest Port 1 View  05/27/2012  *RADIOLOGY REPORT*  Clinical Data: Right pleural effusion.  PORTABLE CHEST - 1 VIEW  Comparison: Chest x-ray 05/23/2012.  Findings: There is a right upper extremity PICC with tip terminating in the mid superior vena cava.  Nasogastric tube has been removed.  Lung volumes remain low.  There are worsening bibasilar  opacities that may reflect areas of atelectasis and/or consolidation.  Small bilateral pleural effusions (right greater than left), increasing on the right.  Pulmonary vascular crowding, without frank pulmonary edema.  Heart size is mildly enlarged (unchanged). The patient is rotated to the left on today's exam, resulting in distortion of the mediastinal contours and reduced diagnostic sensitivity and specificity for mediastinal pathology. Atherosclerosis in the thoracic aorta.  IMPRESSION: 1.  Support apparatus, as above. 2.  Worsening bibasilar aeration compatible with increasing bibasilar atelectasis and/or consolidation and increasing bilateral pleural effusions (particularly on the right). 3.  Atherosclerosis.   Original Report Authenticated By: Trudie [name], M.D.     Anti-infectives: Anti-infectives     Start     Dose/Rate Route Frequency Ordered Stop   05/21/12 0900  piperacillin-tazobactam (ZOSYN) IVPB 3.375 g       3.375 g 12.5 mL/hr over 240 Minutes Intravenous Every 8 hours 05/21/12 0827 05/30/12 1013   05/18/12 2200   ciprofloxacin (CIPRO) IVPB 400 mg  Status:  Discontinued        400 mg 200 mL/hr over 60 Minutes Intravenous Every 12 hours 05/18/12 1457 05/21/12 0759   05/16/12 1000   ciprofloxacin (CIPRO) IVPB 200 mg  Status:  Discontinued        200 mg 100 mL/hr over 60 Minutes Intravenous Every 24 hours 05/16/12 0846 05/18/12 1457   05/16/12 0800   metroNIDAZOLE (FLAGYL) IVPB 500 mg  Status:  Discontinued  500 mg 100 mL/hr over 60 Minutes Intravenous Every 8 hours 05/16/12 0735 05/21/12 0759   05/14/12 0600   ceFAZolin (ANCEF) IVPB 2 g/50 mL premix        2 g 100 mL/hr over 30 Minutes Intravenous On call to O.R. 05/13/12 1424 05/14/12 1407          Assessment/Plan:  Patient Active Problem List  Diagnosis  . Anemia  . History of colon cancer, stage III  . Abdominal pain, acute, right upper quadrant  . Nausea and vomiting  . Dehydration  . Cholecystitis  chronic, acute, S/P lap cholecystectomy 05/14/13 complicated by bile leak, S/P ERCP  . Thoracic aortic aneurysm, 4.8cm 2011  . CHF, past NICM with an EF of 30%, most recent echo 2011 showed EF to be45- 50%  . CAF not felt to be a Coumadin candidate in the past, rate currently controlled  . Acute on chronic renal insufficiency, improved  . Normal coronary arteries, 2008  . Diabetes mellitus  . HTN (hypertension)  . Respiratory distress post op secondary to rapid AF  . ARF (acute renal failure)  . Atrial fibrillation with RVR  . Postoperative ileus  . Small bowel obstruction due to adhesions  . Bile leak, postoperative  . Hypokalemia  . Ileus post op. still NPO  s/p Procedure(s) (LRB) with comments: ENDOSCOPIC RETROGRADE CHOLANGIOPANCREATOGRAPHY (ERCP) (N/A) Continue with full liquid diet. Continue with bowel regimen  PO cardizem  Management of her DM per IM  Continue with IV ABX for now ? Couple of more days. Mobilize/OOB   LOS: 16 days    Blenda Mounts [facility] Surgery Pager # [phone]  05/28/2012  Not taking much PO.  Tolerating full liquids but abdomen bloated.  She is moving her bowels but doesn't feel like eating.  Would certainly go slow with her diet.  UOP adequate now so taking enough po. Wbc improving.  HGB 7.7 but has some chronic anemia.  Follow this.

## 2012-05-28 NOTE — Progress Notes (Signed)
[facility] PROGRESS NOTE  Nicole [name] ZOX:[id number] DOB: 1931/03/19 DOA: 05/12/2012 PCP: Bufford Spikes, DO  Assessment/Plan: Principal Problem:  *Cholecystitis chronic, acute, S/P lap cholecystectomy 05/14/13 complicated by bile leak, S/P ERCP Active Problems:  History of colon cancer, stage III  Thoracic aortic aneurysm, 4.8cm 2011  CHF, past NICM with an EF of 30%, most recent echo 2011 showed EF to be45- 50%  CAF not felt to be a Coumadin candidate in the past, rate currently controlled  Acute on chronic renal insufficiency, improved  Normal coronary arteries, 2008  Diabetes mellitus  HTN (hypertension)  Respiratory distress post op secondary to rapid AF  Ileus post op. still NPO   1. Acute Cholecystitis/Bile Leak  Patient presented with 2 -3 days of persistent RUQ abdominal pain, bloating, nausea and bilious vomiting, and ultrasound revealed symptomatic cholelithiasis. Surgical consultation  Was provided by Dr Manus Rudd. Patient is s/p Lap cholecystectomy on 05/14/12. This was unfortunately complicated by abdominal perihepatic/abdominal wall hematoma and bile leak. S/P ERCP with Stent placement 05/17/12. She was initially treated with empiric Ciprofloxacin/Flagyl, but was switched to iv Zosyn on 05/21/12, due to persisting leukocytosis. CT abdomen/pelvis 05/22/2012 revealed near resolution of fluid collection near the liver and no evidence of abscess. Managing per surgery, and patient is improving gradually. Hemoglobin has remained stable.  2. Post-operative partial SBO/ileus: This was a further complication of surgery, and was managed with bowel rest, iv fluids. Fortunately, improvement is evident, and diet is being advanced per surgery. Patient is on full liquids today. She continues to have multiple stools.  3. Acute Renal Failure:   There is a high index of suspicion for hemodynamically mediated renal injury, as patient was very briefly hypotensive on 05/15/12. This  may have been due to intraabdominal hematoma/developing abdominal  wall hematoma. Now resolved.  4. Afib with RVR:  While in SDU, patient developed fast atrial fibrillation, which was addressed with iv Cardizem drip, and scheduled iv beta-blocker, with resultant rate-control. She was transitioned to oral Cardizem/beta-blocker on 05/26/12, without deleterious effect. Clearly, patient is not a candidate for anticoagulation given hematoma/potential for repeart surgery, more importantly has been deemed not a be candidate for anticoagulation by her cardiologist in the past. Possibly due to non-compliance. Will switch to Cardizem CD, from 05/29/12.  5. Acute hypoxic Resp Failure:  Patient had low Oxygen saturation in the SDU, which was readily corrected with Oxygen supplementation. Etiology is  Multi-factorial, due to compressive atelectasis/mechanical obstruction secondary to abdominal distention/bilateral pleural effusions. She is s/p thoracentesis 05/23/12 right lung with 470 cc of dark bloody fluid obtained. Fluid sent for culture and cytology. Results are still pending.   7. Hypokalemia:   This was secondary to GI loss via NG suction. Repleted as indicated.  8. Hypernatremia: Due to dehydration and volume depletion. Resolved with addition of free water  9. DM-uncontrolled. Managing with SSI. She  did have an episode of hypoglycemia, probably secondary to inadequate caloric intake. CBGs are now reasonable. HBA1C is 6.0.  10. CHF (systolic EF 45% and diastolic dysfunction-grade 1)  No clinical evidence of decompensated.  11. Hypothyroidism: On Synthroid re[placement therapy.  12. HTN:  Sub-optimally controlled . Adjusting antihypertensives as indicated. 13: Anemia: HB is 7.7 today. Multifactorial, due to chronic disease, acute blood loss and critical illness. Following CBC, and if drops further, will likely transfuse.    Code Status: Full Code.  Family Communication:  Disposition Plan: To be determined.     Brief narrative: 78 yo female presenting with  2 -3 days of persistent RUQ abdominal pain, bloating, nausea and bilious vomiting, which is worse after eating. No fever, no diarrhea. Came to ED on 05/11/12 and felt to have symptomatic cholelithiasis per imaging study. Surgical team at that time, recommended discontinuation of Plavix/ASA and f/u as outpt to set up elective cholecystectomy. Patient however, re-presented on 05/12/12, with uncontrolled pain and vomiting despite zofran and pain meds at home. She was admitted, and subsequently underwent a laparoscopic cholecystectomy on 05/14/2012. Postoperatively She developed severe abdominal pain associated with nausea and vomiting. HIDA scan revealed bile leak. In the interim she developed atrial fibrillation with RVR and was subsequentLY started on Cardizem drip. CT scan performed demonstrated small bowel obstruction that warranted NG tube placement and surgical consultation. She has subsequently undergone ERCP with stent placement on 05/17/2012. Patient was transferred to the medical floor, from SDU on 05/27/12.     Consultants:  Dr Manus Rudd, surgeon.  Dr Zoila Shutter, cardiologist.  Dr Casimiro Needle, nephrologist.  Dr Charlott Rakes, gastroenterologist.   Procedures:  See A/P  Antibiotics:  Zosyn 05/21/12>>>  HPI/Subjective: Feels much better.   Objective: Vital signs in last 24 hours: Temp:  [97.8 F (36.6 C)-98.5 F (36.9 C)] 97.8 F (36.6 C) (11/06 0557) Pulse Rate:  [75-85] 85  (11/06 0943) Resp:  [18-20] 20  (11/06 0557) BP: (130-152)/(68-87) 152/68 mmHg (11/06 0943) SpO2:  [86 %-96 %] 96 % (11/06 0557) Weight change:  Last BM Date: 05/28/12  Intake/Output from previous day: 11/05 0701 - 11/06 0700 In: 720 [P.O.:720] Out: 1152 [Urine:1150; Stool:2]     Physical Exam: General: Comfortable, alert, communicative, fully oriented, not short of breath at rest.  HEENT:  Moderate clinical pallor, no jaundice,  no conjunctival injection or discharge. Hydration is satisfactory.  NECK:  Supple, JVP not seen, no carotid bruits, no palpable lymphadenopathy, no palpable goiter. CHEST:  Clinically clear to auscultation, no wheezes, no crackles. HEART:  Sounds 1 and 2 heard, normal, irregular, no murmurs. ABDOMEN:  Full, soft, non-tender. Bowel sounds are heard. GENITALIA:  Not exsmined.  LOWER EXTREMITIES:  Minimal edema, palpable peripheral pulses. MUSCULOSKELETAL SYSTEM:  Generalized osteoarthritic changes, otherwise, normal. CENTRAL NERVOUS SYSTEM:  No focal neurologic deficit on gross examination.  Lab Results:  [facility] 05/28/12 0449 05/26/12 0510  WBC 14.2* 17.6*  HGB 7.7* 8.5*  HCT 22.4* 24.7*  PLT 541* 456*    Basename 05/28/12 0449 05/27/12 0500  NA 139 134*  K 4.2 3.9  CL 103 99  CO2 29 30  GLUCOSE 138* 226*  BUN 15 16  CREATININE 0.91 0.82  CALCIUM 8.2* 8.4   Recent Results (from the past 240 hour(s))  CULTURE, BLOOD (ROUTINE X 2)     Status: Normal (Preliminary result)   Collection Time   05/23/12  8:54 AM      Component Value Range Status Comment   Specimen Description BLOOD LEFT ARM   Final    Special Requests BOTTLES DRAWN AEROBIC AND ANAEROBIC 10CC   Final    Culture  Setup Time 05/23/2012 15:31   Final    Culture     Final    Value:        BLOOD CULTURE RECEIVED NO GROWTH TO DATE CULTURE WILL BE HELD FOR 5 DAYS BEFORE ISSUING A FINAL NEGATIVE REPORT   Report Status PENDING   Incomplete   CULTURE, BLOOD (ROUTINE X 2)     Status: Normal (Preliminary result)   Collection Time   05/23/12  9:13 AM  Component Value Range Status Comment   Specimen Description BLOOD LEFT HAND   Final    Special Requests BOTTLES DRAWN AEROBIC AND ANAEROBIC 10CC   Final    Culture  Setup Time 05/23/2012 15:31   Final    Culture     Final    Value:        BLOOD CULTURE RECEIVED NO GROWTH TO DATE CULTURE WILL BE HELD FOR 5 DAYS BEFORE ISSUING A FINAL NEGATIVE REPORT   Report Status PENDING    Incomplete   BODY FLUID CULTURE     Status: Normal   Collection Time   05/23/12 12:03 PM      Component Value Range Status Comment   Specimen Description PLEURAL FLUID RIGHT   Final    Special Requests   Final    Gram Stain     Final    Value: RARE WBC PRESENT, PREDOMINANTLY PMN     NO ORGANISMS SEEN   Culture NO GROWTH 3 DAYS   Final    Report Status 05/27/2012 FINAL   Final   URINE CULTURE     Status: Normal   Collection Time   05/23/12  1:27 PM      Component Value Range Status Comment   Specimen Description URINE, CATHETERIZED   Final    Special Requests NONE   Final    Culture  Setup Time 05/23/2012 14:10   Final    Colony Count NO GROWTH   Final    Culture NO GROWTH   Final    Report Status 05/24/2012 FINAL   Final      Studies/Results: Dg Chest Port 1 View  05/27/2012  *RADIOLOGY REPORT*  Clinical Data: Right pleural effusion.  PORTABLE CHEST - 1 VIEW  Comparison: Chest x-ray 05/23/2012.  Findings: There is a right upper extremity PICC with tip terminating in the mid superior vena cava.  Nasogastric tube has been removed.  Lung volumes remain low.  There are worsening bibasilar opacities that may reflect areas of atelectasis and/or consolidation.  Small bilateral pleural effusions (right greater than left), increasing on the right.  Pulmonary vascular crowding, without frank pulmonary edema.  Heart size is mildly enlarged (unchanged). The patient is rotated to the left on today's exam, resulting in distortion of the mediastinal contours and reduced diagnostic sensitivity and specificity for mediastinal pathology. Atherosclerosis in the thoracic aorta.  IMPRESSION: 1.  Support apparatus, as above. 2.  Worsening bibasilar aeration compatible with increasing bibasilar atelectasis and/or consolidation and increasing bilateral pleural effusions (particularly on the right). 3.  Atherosclerosis.   Original Report Authenticated By: Trudie [name], M.D.     Medications: Scheduled  Meds:   . allopurinol  100 mg Oral Daily  . atorvastatin  40 mg Oral Daily  . diltiazem  60 mg Oral Q6H  . feeding supplement  1 Container Oral TID WC  . furosemide  40 mg Oral Daily  . insulin aspart  0-5 Units Subcutaneous QHS  . insulin aspart  0-9 Units Subcutaneous TID WC  . levothyroxine  50 mcg Oral Q breakfast  . lip balm   Topical BID  . metoprolol tartrate  50 mg Oral BID  . piperacillin-tazobactam (ZOSYN)  IV  3.375 g Intravenous Q8H  . potassium chloride  10 mEq Oral Daily  . psyllium  1 packet Oral BID  . saccharomyces boulardii  250 mg Oral BID  . sodium chloride  10-40 mL Intracatheter Q12H   Continuous Infusions:   . sodium  chloride Stopped (05/23/12 2100)   PRN Meds:.acetaminophen, alum & mag hydroxide-simeth, bisacodyl, hydrALAZINE, morphine injection, ondansetron (ZOFRAN) IV, phenol, promethazine, simethicone, sodium chloride    LOS: 16 days   Jamesmichael Shadd,CHRISTOPHER  [facility] Pager [phone]. If 8PM-8AM, please contact night-coverage at [email or web address], password [facility] 05/28/2012, 1:22 PM  LOS: 16 days

## 2012-05-28 NOTE — Progress Notes (Signed)
Pt amb 135ft pushing a W/C with 1xA and O2 at 2L. Pt stopped twice to "catch her breath." Pt left in chair with call bell in reach. Will continue to monitor pt.

## 2012-05-29 LAB — CBC WITH DIFFERENTIAL/PLATELET
Basophils Relative: 0 % (ref 0–1)
Eosinophils Absolute: 0.2 10*3/uL (ref 0.0–0.7)
Lymphs Abs: 0.8 10*3/uL (ref 0.7–4.0)
MCH: 28.4 pg (ref 26.0–34.0)
MCHC: 34.4 g/dL (ref 30.0–36.0)
Neutro Abs: 8.3 10*3/uL — ABNORMAL HIGH (ref 1.7–7.7)
Neutrophils Relative %: 80 % — ABNORMAL HIGH (ref 43–77)
Platelets: 528 10*3/uL — ABNORMAL HIGH (ref 150–400)
RBC: 3.28 MIL/uL — ABNORMAL LOW (ref 3.87–5.11)

## 2012-05-29 LAB — CULTURE, BLOOD (ROUTINE X 2): Culture: NO GROWTH

## 2012-05-29 LAB — GLUCOSE, CAPILLARY
Glucose-Capillary: 105 mg/dL — ABNORMAL HIGH (ref 70–99)
Glucose-Capillary: 156 mg/dL — ABNORMAL HIGH (ref 70–99)
Glucose-Capillary: 135 mg/dL — ABNORMAL HIGH (ref 70–99)

## 2012-05-29 LAB — COMPREHENSIVE METABOLIC PANEL
ALT: 20 U/L (ref 0–35)
Albumin: 2.1 g/dL — ABNORMAL LOW (ref 3.5–5.2)
Alkaline Phosphatase: 113 U/L (ref 39–117)
Chloride: 103 mEq/L (ref 96–112)
Potassium: 3.9 mEq/L (ref 3.5–5.1)
Sodium: 141 mEq/L (ref 135–145)
Total Protein: 6.4 g/dL (ref 6.0–8.3)

## 2012-05-29 NOTE — Progress Notes (Signed)
12 Days Post-Op  Subjective: Feeling ok today Tolerating full liquid diet.  Wants to stay on fulls Minimal pain Mild SOB  Objective: Vital signs in last 24 hours: Temp:  [98.3 F (36.8 C)-98.7 F (37.1 C)] 98.7 F (37.1 C) (11/07 0646) Pulse Rate:  [76-89] 89  (11/07 0646) Resp:  [19-20] 19  (11/07 0646) BP: (133-170)/(68-89) 136/80 mmHg (11/07 0806) SpO2:  [94 %-100 %] 97 % (11/07 0646) Weight:  [187 lb 9.8 oz (85.1 kg)] 187 lb 9.8 oz (85.1 kg) (11/07 0646) Last BM Date: 05/29/12  Intake/Output from previous day: 11/06 0701 - 11/07 0700 In: 720 [P.O.:720] Out: 700 [Urine:700] Intake/Output this shift: Total I/O In: 240 [P.O.:240] Out: -   Abdomen soft, minimally tender, incisions clean Lungs with occ crackle  Lab Results:   [facility] 05/29/12 0410 05/28/12 0449  WBC 10.4 14.2*  HGB 9.3* 7.7*  HCT 27.0* 22.4*  PLT 528* 541*   BMET  Basename 05/29/12 0410 05/28/12 0449  NA 141 139  K 3.9 4.2  CL 103 103  CO2 30 29  GLUCOSE 111* 138*  BUN 13 15  CREATININE 0.93 0.91  CALCIUM 8.4 8.2*   PT/INR No results found for this basename: LABPROT:2,INR:2 in the last 72 hours ABG No results found for this basename: PHART:2,PCO2:2,PO2:2,HCO3:2 in the last 72 hours  Studies/Results: No results found.  Anti-infectives: Anti-infectives     Start     Dose/Rate Route Frequency Ordered Stop   05/21/12 0900   piperacillin-tazobactam (ZOSYN) IVPB 3.375 g        3.375 g 12.5 mL/hr over 240 Minutes Intravenous Every 8 hours 05/21/12 0827 05/30/12 1013   05/18/12 2200   ciprofloxacin (CIPRO) IVPB 400 mg  Status:  Discontinued        400 mg 200 mL/hr over 60 Minutes Intravenous Every 12 hours 05/18/12 1457 05/21/12 0759   05/16/12 1000   ciprofloxacin (CIPRO) IVPB 200 mg  Status:  Discontinued        200 mg 100 mL/hr over 60 Minutes Intravenous Every 24 hours 05/16/12 0846 05/18/12 1457   05/16/12 0800   metroNIDAZOLE (FLAGYL) IVPB 500 mg  Status:  Discontinued        500 mg 100 mL/hr over 60 Minutes Intravenous Every 8 hours 05/16/12 0735 05/21/12 0759   05/14/12 0600   ceFAZolin (ANCEF) IVPB 2 g/50 mL premix        2 g 100 mL/hr over 30 Minutes Intravenous On call to O.R. 05/13/12 1424 05/14/12 1407          Assessment/Plan: s/p Procedure(s) (LRB) with comments: ENDOSCOPIC RETROGRADE CHOLANGIOPANCREATOGRAPHY (ERCP) (N/A)  Stable from gen surg standpoint.  Will keep on full liquids.  Two more days of IV antibiotics ambulate  LOS: 17 days    Varvara Legault A 05/29/2012

## 2012-05-29 NOTE — Progress Notes (Signed)
[facility] PROGRESS NOTE  [name] EAV:[id number] DOB: Jul 30, 1930 DOA: 05/12/2012 PCP: Bufford Spikes, DO  Assessment/Plan: Principal Problem:  *Cholecystitis chronic, acute, S/P lap cholecystectomy 05/14/13 complicated by bile leak, S/P ERCP Active Problems:  History of colon cancer, stage III  Thoracic aortic aneurysm, 4.8cm 2011  CHF, past NICM with an EF of 30%, most recent echo 2011 showed EF to be45- 50%  CAF not felt to be a Coumadin candidate in the past, rate currently controlled  Acute on chronic renal insufficiency, improved  Normal coronary arteries, 2008  Diabetes mellitus  HTN (hypertension)  Respiratory distress post op secondary to rapid AF  Ileus post op. still NPO   1. Acute Cholecystitis/Bile Leak  Patient presented with 2 -3 days of persistent RUQ abdominal pain, bloating, nausea and bilious vomiting, and ultrasound revealed symptomatic cholelithiasis. Surgical consultation  Was provided by Dr Manus Rudd. Patient is s/p Lap cholecystectomy on 05/14/12. This was unfortunately complicated by abdominal perihepatic/abdominal wall hematoma and bile leak. S/P ERCP with Stent placement 05/17/12. She was initially treated with empiric Ciprofloxacin/Flagyl, but was switched to iv Zosyn on 05/21/12, due to persisting leukocytosis. CT abdomen/pelvis 05/22/2012 revealed near resolution of fluid collection near the liver and no evidence of abscess. Managing per surgery, and patient is improving gradually. Hemoglobin has remained stable. Per surgical team, antibiotics will be completed on 05/31/12.  2. Post-operative partial SBO/ileus: This was a further complication of surgery, and was managed with bowel rest, iv fluids. Fortunately, improvement is evident, and diet is being advanced per surgery. Patient is still on full liquids and is tolerating this well. She continues to have multiple stools. We shall check C. Difficile PCR.  3. Acute Renal Failure:   There is a  high index of suspicion for hemodynamically mediated renal injury, as patient was very briefly hypotensive on 05/15/12. This may have been due to intraabdominal hematoma/developing abdominal  wall hematoma. Now resolved.  4. Afib with RVR:  While in SDU, patient developed fast atrial fibrillation, which was addressed with iv Cardizem drip, and scheduled iv beta-blocker, with resultant rate-control. She was transitioned to oral Cardizem/beta-blocker on 05/26/12, without deleterious effect. Clearly, patient is not a candidate for anticoagulation given hematoma/potential for repeart surgery, more importantly has been deemed not a be candidate for anticoagulation by her cardiologist in the past. Possibly due to non-compliance. Patient  Was switched to Cardizem CD, from 05/29/12, without deleterious effect.  5. Acute hypoxic Resp Failure:  Patient had low Oxygen saturation in the SDU, which was readily corrected with Oxygen supplementation. Etiology is  Multi-factorial, due to compressive atelectasis/mechanical obstruction secondary to abdominal distention/bilateral pleural effusions. She is s/p thoracentesis 05/23/12 right lung with 470 cc of dark bloody fluid obtained. Fluid sent for culture and cytology. This has shown no growth to date.   7. Hypokalemia:   This was secondary to GI loss via NG suction. Repleted as indicated. Resolved.  8. Hypernatremia: Due to dehydration and volume depletion. Resolved with addition of free water  9. DM-uncontrolled. Managing with SSI. She  did have an episode of hypoglycemia, probably secondary to inadequate caloric intake. CBGs are now reasonable. HBA1C is 6.0.  10. CHF (systolic EF 45% and diastolic dysfunction-grade 1)  No clinical evidence of decompensation.  11. Hypothyroidism: On Synthroid re[placement therapy.  12. HTN:  Sub-optimally controlled, initially. Adjusted antihypertensives as indicated, with satisfactory control. 13: Anemia: HB was 7.7 on 05/28/12.  Multifactorial, due to chronic disease, acute blood loss and critical illness. Following CBC,  and Hemoglobin is improved at 9.3 today.    Code Status: Full Code.  Family Communication:  Disposition Plan: Per surgery. Nearing discharge.    Brief narrative: 78 yo female presenting with 2 -3 days of persistent RUQ abdominal pain, bloating, nausea and bilious vomiting, which is worse after eating. No fever, no diarrhea. Came to ED on 05/11/12 and felt to have symptomatic cholelithiasis per imaging study. Surgical team at that time, recommended discontinuation of Plavix/ASA and f/u as outpt to set up elective cholecystectomy. Patient however, re-presented on 05/12/12, with uncontrolled pain and vomiting despite zofran and pain meds at home. She was admitted, and subsequently underwent a laparoscopic cholecystectomy on 05/14/2012. Postoperatively She developed severe abdominal pain associated with nausea and vomiting. HIDA scan revealed bile leak. In the interim she developed atrial fibrillation with RVR and was subsequentLY started on Cardizem drip. CT scan performed demonstrated small bowel obstruction that warranted NG tube placement and surgical consultation. She has subsequently undergone ERCP with stent placement on 05/17/2012. Patient was transferred to the medical floor, from SDU on 05/27/12.     Consultants:  Dr Manus Rudd, surgeon.  Dr Zoila Shutter, cardiologist.  Dr Casimiro Needle, nephrologist.  Dr Charlott Rakes, gastroenterologist.   Procedures:  See A/P  Antibiotics:  Zosyn 05/21/12>>>  HPI/Subjective: Feels much better.   Objective: Vital signs in last 24 hours: Temp:  [98.3 F (36.8 C)-98.7 F (37.1 C)] 98.4 F (36.9 C) (11/07 1410) Pulse Rate:  [76-125] 84  (11/07 1410) Resp:  [18-20] 18  (11/07 1410) BP: (111-170)/(65-89) 111/65 mmHg (11/07 1410) SpO2:  [94 %-100 %] 97 % (11/07 1410) Weight:  [85.1 kg (187 lb 9.8 oz)] 85.1 kg (187 lb 9.8 oz) (11/07  0646) Weight change:  Last BM Date: 05/29/12  Intake/Output from previous day: 11/06 0701 - 11/07 0700 In: 720 [P.O.:720] Out: 700 [Urine:700] Total I/O In: 480 [P.O.:480] Out: 201 [Urine:200; Stool:1]   Physical Exam: General: Comfortable, alert, communicative, fully oriented, not short of breath at rest.  HEENT:  Moderate clinical pallor, no jaundice, no conjunctival injection or discharge. Hydration is satisfactory.  NECK:  Supple, JVP not seen, no carotid bruits, no palpable lymphadenopathy, no palpable goiter. CHEST:  Clinically clear to auscultation, no wheezes, no crackles. HEART:  Sounds 1 and 2 heard, normal, irregular, no murmurs. ABDOMEN:  Full, soft, non-tender. Bowel sounds are heard. GENITALIA:  Not exsmined.  LOWER EXTREMITIES:  Minimal edema, palpable peripheral pulses. MUSCULOSKELETAL SYSTEM:  Generalized osteoarthritic changes, otherwise, normal. CENTRAL NERVOUS SYSTEM:  No focal neurologic deficit on gross examination.  Lab Results:  [facility] 05/29/12 0410 05/28/12 0449  WBC 10.4 14.2*  HGB 9.3* 7.7*  HCT 27.0* 22.4*  PLT 528* 541*    Basename 05/29/12 0410 05/28/12 0449  NA 141 139  K 3.9 4.2  CL 103 103  CO2 30 29  GLUCOSE 111* 138*  BUN 13 15  CREATININE 0.93 0.91  CALCIUM 8.4 8.2*   Recent Results (from the past 240 hour(s))  CULTURE, BLOOD (ROUTINE X 2)     Status: Normal   Collection Time   05/23/12  8:54 AM      Component Value Range Status Comment   Specimen Description BLOOD LEFT ARM   Final    Special Requests BOTTLES DRAWN AEROBIC AND ANAEROBIC 10CC   Final    Culture  Setup Time 05/23/2012 15:31   Final    Culture NO GROWTH 5 DAYS   Final    Report Status 05/29/2012 FINAL  Final   CULTURE, BLOOD (ROUTINE X 2)     Status: Normal   Collection Time   05/23/12  9:13 AM      Component Value Range Status Comment   Specimen Description BLOOD LEFT HAND   Final    Special Requests BOTTLES DRAWN AEROBIC AND ANAEROBIC 10CC   Final     Culture  Setup Time 05/23/2012 15:31   Final    Culture NO GROWTH 5 DAYS   Final    Report Status 05/29/2012 FINAL   Final   BODY FLUID CULTURE     Status: Normal   Collection Time   05/23/12 12:03 PM      Component Value Range Status Comment   Specimen Description PLEURAL FLUID RIGHT   Final    Special Requests   Final    Gram Stain     Final    Value: RARE WBC PRESENT, PREDOMINANTLY PMN     NO ORGANISMS SEEN   Culture NO GROWTH 3 DAYS   Final    Report Status 05/27/2012 FINAL   Final   URINE CULTURE     Status: Normal   Collection Time   05/23/12  1:27 PM      Component Value Range Status Comment   Specimen Description URINE, CATHETERIZED   Final    Special Requests NONE   Final    Culture  Setup Time 05/23/2012 14:10   Final    Colony Count NO GROWTH   Final    Culture NO GROWTH   Final    Report Status 05/24/2012 FINAL   Final      Studies/Results: No results found.  Medications: Scheduled Meds:    . allopurinol  100 mg Oral Daily  . atorvastatin  40 mg Oral Daily  . diltiazem  240 mg Oral Daily  . [COMPLETED] diltiazem  60 mg Oral Q6H  . feeding supplement  1 Container Oral TID WC  . furosemide  40 mg Oral Daily  . insulin aspart  0-5 Units Subcutaneous QHS  . insulin aspart  0-9 Units Subcutaneous TID WC  . levothyroxine  50 mcg Oral Q breakfast  . lip balm   Topical BID  . metoprolol tartrate  50 mg Oral BID  . piperacillin-tazobactam (ZOSYN)  IV  3.375 g Intravenous Q8H  . potassium chloride  10 mEq Oral Daily  . psyllium  1 packet Oral BID  . saccharomyces boulardii  250 mg Oral BID  . sodium chloride  10-40 mL Intracatheter Q12H   Continuous Infusions:    . sodium chloride Stopped (05/23/12 2100)   PRN Meds:.acetaminophen, alum & mag hydroxide-simeth, bisacodyl, hydrALAZINE, morphine injection, ondansetron (ZOFRAN) IV, phenol, promethazine, simethicone, sodium chloride    LOS: 17 days   Chrisann Melaragno,CHRISTOPHER  [facility] Pager [phone]. If  8PM-8AM, please contact night-coverage at [email or web address], password [facility] 05/29/2012, 2:31 PM  LOS: 17 days

## 2012-05-29 NOTE — Progress Notes (Signed)
Pt was sitting in recliner next to her bed and a friend was visiting. Pt was glad for Chaplain visit. She talked about subjects in general and about her son who recently passed away. She was glad for visit and prayer.  Marjory Lies Chaplain

## 2012-05-29 NOTE — Progress Notes (Signed)
ANTIBIOTIC CONSULT NOTE - FOLLOW UP  Pharmacy Consult for Zosyn Indication: Peritonitis  Allergies  Allergen Reactions  . Iohexol      Code: VOM, Desc: PT REPORTS VOMITING W/ IVP DYE- ARS 12/26/08, Onset Date: [id number]    Microbiology: Recent Results (from the past 720 hour(s))  SURGICAL PCR SCREEN     Status: Abnormal   Collection Time   05/14/12  4:10 AM      Component Value Range Status Comment   MRSA, PCR NEGATIVE  NEGATIVE Final    Staphylococcus aureus POSITIVE (*) NEGATIVE Final   URINE CULTURE     Status: Normal   Collection Time   05/16/12  1:50 AM      Component Value Range Status Comment   Specimen Description URINE, CATHETERIZED   Final    Special Requests NONE   Final    Culture  Setup Time 05/16/2012 02:27   Final    Colony Count NO GROWTH   Final    Culture NO GROWTH   Final    Report Status 05/16/2012 FINAL   Final   CULTURE, BLOOD (ROUTINE X 2)     Status: Normal   Collection Time   05/23/12  8:54 AM      Component Value Range Status Comment   Specimen Description BLOOD LEFT ARM   Final    Special Requests BOTTLES DRAWN AEROBIC AND ANAEROBIC 10CC   Final    Culture  Setup Time 05/23/2012 15:31   Final    Culture NO GROWTH 5 DAYS   Final    Report Status 05/29/2012 FINAL   Final   CULTURE, BLOOD (ROUTINE X 2)     Status: Normal   Collection Time   05/23/12  9:13 AM      Component Value Range Status Comment   Specimen Description BLOOD LEFT HAND   Final    Special Requests BOTTLES DRAWN AEROBIC AND ANAEROBIC 10CC   Final    Culture  Setup Time 05/23/2012 15:31   Final    Culture NO GROWTH 5 DAYS   Final    Report Status 05/29/2012 FINAL   Final   BODY FLUID CULTURE     Status: Normal   Collection Time   05/23/12 12:03 PM      Component Value Range Status Comment   Specimen Description PLEURAL FLUID RIGHT   Final    Special Requests   Final    Gram Stain     Final    Value: RARE WBC PRESENT, PREDOMINANTLY PMN     NO ORGANISMS SEEN   Culture NO  GROWTH 3 DAYS   Final    Report Status 05/27/2012 FINAL   Final   URINE CULTURE     Status: Normal   Collection Time   05/23/12  1:27 PM      Component Value Range Status Comment   Specimen Description URINE, CATHETERIZED   Final    Special Requests NONE   Final    Culture  Setup Time 05/23/2012 14:10   Final    Colony Count NO GROWTH   Final    Culture NO GROWTH   Final    Report Status 05/24/2012 FINAL   Final     Assessment: 78yo female on Zosyn for peritonitis, to broaden antibiotic coverage.  Today is day #9 of Zosyn with WBC continue trending down to 10.4K and pt is afebrile.  All cultures are negative. Renal function has been stable, est. crcl ~ 41ml/min  Goal of Therapy:  Resolution of infection  Plan:  1.  Continue Zosyn at current dosing 3.375 g IV q8h (infuse each dose over 4 hours).     Noah Delaine, RPh Clinical Pharmacist Pager: [phone] 05/29/2012 17:12PM

## 2012-05-30 DIAGNOSIS — K838 Other specified diseases of biliary tract: Secondary | ICD-10-CM | POA: Diagnosis present

## 2012-05-30 LAB — GLUCOSE, CAPILLARY
Glucose-Capillary: 130 mg/dL — ABNORMAL HIGH (ref 70–99)
Glucose-Capillary: 147 mg/dL — ABNORMAL HIGH (ref 70–99)
Glucose-Capillary: 128 mg/dL — ABNORMAL HIGH (ref 70–99)

## 2012-05-30 LAB — BASIC METABOLIC PANEL
BUN: 11 mg/dL (ref 6–23)
Chloride: 106 mEq/L (ref 96–112)
GFR calc non Af Amer: 51 mL/min — ABNORMAL LOW (ref >90)
Glucose, Bld: 124 mg/dL — ABNORMAL HIGH (ref 70–99)
Potassium: 3.7 mEq/L (ref 3.5–5.1)

## 2012-05-30 LAB — CBC
HCT: 23.8 % — ABNORMAL LOW (ref 36.0–46.0)
Hemoglobin: 8.1 g/dL — ABNORMAL LOW (ref 12.0–15.0)
MCHC: 34 g/dL (ref 30.0–36.0)

## 2012-05-30 LAB — CLOSTRIDIUM DIFFICILE BY PCR: Toxigenic C. Difficile by PCR: NEGATIVE

## 2012-05-30 NOTE — Progress Notes (Signed)
Nicole [name],PT Acute Rehabilitation [phone] (pager)  

## 2012-05-30 NOTE — Progress Notes (Signed)
[facility] PROGRESS NOTE  Nicole [name] ZOX:[id number] DOB: 08/21/1930 DOA: 05/12/2012 PCP: Bufford Spikes, DO  Assessment/Plan: Principal Problem:  *Leakage of common bile duct s/p ERCP / stent Active Problems:  History of colon cancer, stage III  Cholecystitis chronic, acute, S/P lap cholecystectomy 05/14/12  Thoracic aortic aneurysm, 4.8cm 2011  CHF, past NICM with an EF of 30%, most recent echo 2011 showed EF to be45- 50%  CAF not felt to be a Coumadin candidate in the past, rate currently controlled  Acute on chronic renal insufficiency, improved  Normal coronary arteries, 2008  Diabetes mellitus  HTN (hypertension)  Respiratory distress post op secondary to rapid AF  Ileus post op. still NPO   1. Acute Cholecystitis/Bile Leak  Patient presented with 2 -3 days of persistent RUQ abdominal pain, bloating, nausea and bilious vomiting, and ultrasound revealed symptomatic cholelithiasis. Surgical consultation  Was provided by Dr Nicole [name]. Patient is s/p Lap cholecystectomy on 05/14/12. This was unfortunately complicated by abdominal perihepatic/abdominal wall hematoma and bile leak. S/P ERCP with Stent placement 05/17/12. She was initially treated with empiric Ciprofloxacin/Flagyl, but was switched to iv Zosyn on 05/21/12, due to persisting leukocytosis. CT abdomen/pelvis 05/22/2012 revealed near resolution of fluid collection near the liver and no evidence of abscess. Managing per surgery, and patient is improving gradually. Hemoglobin has remained stable. Per surgical team, antibiotics will be completed on 05/31/12.  2. Post-operative partial SBO/ileus: This was a further complication of surgery, and was managed with bowel rest, iv fluids. Fortunately, improvement is evident, and diet is being advanced per surgery. Patient is still on low fat diet from today. She continues to have multiple stools. C. Difficile PCR was negative.  3. Acute Renal Failure:   There is a high index  of suspicion for hemodynamically mediated renal injury, as patient was very briefly hypotensive on 05/15/12. This may have been due to intraabdominal hematoma/developing abdominal  wall hematoma. Now resolved.  4. Afib with RVR:  While in SDU, patient developed fast atrial fibrillation, which was addressed with iv Cardizem drip, and scheduled iv beta-blocker, with resultant rate-control. She was transitioned to oral Cardizem/beta-blocker on 05/26/12, without deleterious effect. Clearly, patient is not a candidate for anticoagulation given hematoma/potential for repeart surgery, more importantly has been deemed not a be candidate for anticoagulation by her cardiologist in the past. Possibly due to non-compliance. Patient  Was switched to Cardizem CD, from 05/29/12, without deleterious effect.  5. Acute hypoxic Resp Failure:  Patient had low Oxygen saturation in the SDU, which was readily corrected with Oxygen supplementation. Etiology is  Multi-factorial, due to compressive atelectasis/mechanical obstruction secondary to abdominal distention/bilateral pleural effusions. She is s/p thoracentesis 05/23/12 right lung with 470 cc of dark bloody fluid obtained. Fluid sent for culture and cytology. This has shown no growth to date. Now saturating at 94% on 2 L [name].  7. Hypokalemia:   This was secondary to GI loss via NG suction. Repleted as indicated. Resolved.  8. Hypernatremia: Due to dehydration and volume depletion. Resolved with addition of free water  9. DM-uncontrolled. Managing with SSI. She  did have an episode of hypoglycemia, probably secondary to inadequate caloric intake. CBGs are now reasonable. HBA1C is 6.0.  10. CHF (systolic EF 45% and diastolic dysfunction-grade 1)  No clinical evidence of decompensation.  11. Hypothyroidism: On Synthroid re[placement therapy.  12. HTN:  Sub-optimally controlled, initially. Adjusted antihypertensives as indicated, with satisfactory control. 13: Anemia: HB was  7.7 on 05/28/12. Multifactorial, due to chronic disease,  acute blood loss and critical illness. Following CBC, and Hemoglobin is improved at 9.3 as of 05/29/12.    Code Status: Full Code.  Family Communication:  Disposition Plan: Per surgery. Nearing discharge.    Brief narrative: 78 yo female presenting with 2 -3 days of persistent RUQ abdominal pain, bloating, nausea and bilious vomiting, which is worse after eating. No fever, no diarrhea. Came to ED on 05/11/12 and felt to have symptomatic cholelithiasis per imaging study. Surgical team at that time, recommended discontinuation of Plavix/ASA and f/u as outpt to set up elective cholecystectomy. Patient however, re-presented on 05/12/12, with uncontrolled pain and vomiting despite zofran and pain meds at home. She was admitted, and subsequently underwent a laparoscopic cholecystectomy on 05/14/2012. Postoperatively She developed severe abdominal pain associated with nausea and vomiting. HIDA scan revealed bile leak. In the interim she developed atrial fibrillation with RVR and was subsequently started on Cardizem drip. CT scan performed demonstrated small bowel obstruction that warranted NG tube placement and surgical consultation. She has subsequently undergone ERCP with stent placement on 05/17/2012. Patient was transferred to the medical floor, from SDU on 05/27/12.     Consultants:  Dr Nicole [name], surgeon.  Dr Nicole [name], cardiologist.  Dr Nicole [name], nephrologist.  Dr Nicole [name], gastroenterologist.   Procedures:  See A/P  Antibiotics:  Zosyn 05/21/12>>>  HPI/Subjective: Feels much better.   Objective: Vital signs in last 24 hours: Temp:  [98.6 F (37 C)-99.1 F (37.3 C)] 98.6 F (37 C) (11/08 1311) Pulse Rate:  [72-88] 86  (11/08 1311) Resp:  [18-19] 19  (11/08 0400) BP: (135-150)/(66-123) 135/66 mmHg (11/08 1311) SpO2:  [94 %-95 %] 94 % (11/08 1311) Weight change:  Last BM Date:  05/30/12  Intake/Output from previous day: 11/07 0701 - 11/08 0700 In: 720 [P.O.:720] Out: 1151 [Urine:1150; Stool:1]     Physical Exam: General: Comfortable in chair, alert, communicative, fully oriented, not short of breath at rest.  HEENT:  Moderate clinical pallor, no jaundice, no conjunctival injection or discharge. Hydration is satisfactory.  NECK:  Supple, JVP not seen, no carotid bruits, no palpable lymphadenopathy, no palpable goiter. CHEST:  Clinically clear to auscultation, no wheezes, no crackles. HEART:  Sounds 1 and 2 heard, normal, irregular, no murmurs. ABDOMEN:  Full, soft, non-tender. Bowel sounds are heard. GENITALIA:  Not examined.  LOWER EXTREMITIES:  Minimal edema, palpable peripheral pulses. MUSCULOSKELETAL SYSTEM:  Generalized osteoarthritic changes, otherwise, normal. CENTRAL NERVOUS SYSTEM:  No focal neurologic deficit on gross examination.  Lab Results:  [facility] 05/30/12 0550 05/29/12 0410  WBC 11.1* 10.4  HGB 8.1* 9.3*  HCT 23.8* 27.0*  PLT 580* 528*    Basename 05/30/12 0550 05/29/12 0410  NA 143 141  K 3.7 3.9  CL 106 103  CO2 32 30  GLUCOSE 124* 111*  BUN 11 13  CREATININE 1.02 0.93  CALCIUM 8.3* 8.4   Recent Results (from the past 240 hour(s))  CULTURE, BLOOD (ROUTINE X 2)     Status: Normal   Collection Time   05/23/12  8:54 AM      Component Value Range Status Comment   Specimen Description BLOOD LEFT ARM   Final    Special Requests BOTTLES DRAWN AEROBIC AND ANAEROBIC 10CC   Final    Culture  Setup Time 05/23/2012 15:31   Final    Culture NO GROWTH 5 DAYS   Final    Report Status 05/29/2012 FINAL   Final   CULTURE, BLOOD (ROUTINE X 2)  Status: Normal   Collection Time   05/23/12  9:13 AM      Component Value Range Status Comment   Specimen Description BLOOD LEFT HAND   Final    Special Requests BOTTLES DRAWN AEROBIC AND ANAEROBIC 10CC   Final    Culture  Setup Time 05/23/2012 15:31   Final    Culture NO GROWTH 5 DAYS   Final     Report Status 05/29/2012 FINAL   Final   BODY FLUID CULTURE     Status: Normal   Collection Time   05/23/12 12:03 PM      Component Value Range Status Comment   Specimen Description PLEURAL FLUID RIGHT   Final    Special Requests   Final    Gram Stain     Final    Value: RARE WBC PRESENT, PREDOMINANTLY PMN     NO ORGANISMS SEEN   Culture NO GROWTH 3 DAYS   Final    Report Status 05/27/2012 FINAL   Final   URINE CULTURE     Status: Normal   Collection Time   05/23/12  1:27 PM      Component Value Range Status Comment   Specimen Description URINE, CATHETERIZED   Final    Special Requests NONE   Final    Culture  Setup Time 05/23/2012 14:10   Final    Colony Count NO GROWTH   Final    Culture NO GROWTH   Final    Report Status 05/24/2012 FINAL   Final   CLOSTRIDIUM DIFFICILE BY PCR     Status: Normal   Collection Time   05/30/12 11:30 AM      Component Value Range Status Comment   C difficile by pcr NEGATIVE  NEGATIVE Final      Studies/Results: No results found.  Medications: Scheduled Meds:    . allopurinol  100 mg Oral Daily  . atorvastatin  40 mg Oral Daily  . diltiazem  240 mg Oral Daily  . feeding supplement  1 Container Oral TID WC  . furosemide  40 mg Oral Daily  . insulin aspart  0-5 Units Subcutaneous QHS  . insulin aspart  0-9 Units Subcutaneous TID WC  . levothyroxine  50 mcg Oral Q breakfast  . lip balm   Topical BID  . metoprolol tartrate  50 mg Oral BID  . [EXPIRED] piperacillin-tazobactam (ZOSYN)  IV  3.375 g Intravenous Q8H  . potassium chloride  10 mEq Oral Daily  . psyllium  1 packet Oral BID  . saccharomyces boulardii  250 mg Oral BID  . sodium chloride  10-40 mL Intracatheter Q12H   Continuous Infusions:    . sodium chloride Stopped (05/23/12 2100)   PRN Meds:.acetaminophen, alum & mag hydroxide-simeth, bisacodyl, hydrALAZINE, morphine injection, ondansetron (ZOFRAN) IV, phenol, promethazine, simethicone, sodium chloride    LOS: 18  days   Georga Stys,CHRISTOPHER  [facility] Pager [phone]. If 8PM-8AM, please contact night-coverage at [email or web address], password [facility] 05/30/2012, 7:16 PM  LOS: 18 days

## 2012-05-30 NOTE — Progress Notes (Signed)
Physical Therapy Treatment Patient Details Name: Nicole [name] MRN: [id number] DOB: 1930/12/23 Today's Date: 05/30/2012 Time: 9147-8295 PT Time Calculation (min): 30 min  PT Assessment / Plan / Recommendation Comments on Treatment Session  Pt demonstrated improved posture and RW manipulation during gait, however required 2L of O2 to maintain >95% O2 sats while ambulating and her HR increased to 120-130bpm when she was walking and talking at the same time.  All returned to baseline with seated rest and pt stated, "I feel fine, thank you for walking with me." PT reminded pt to slow down particularrly when sit/stand.  Pt is looking forward to returning home and states her family plans to take shifts assisting her.      Follow Up Recommendations  Supervision for mobility/OOB;Home health PT     Does the patient have the potential to tolerate intense rehabilitation     Barriers to Discharge        Equipment Recommendations  Rolling walker with 5" wheels    Recommendations for Other Services    Frequency Min 3X/week   Plan Discharge plan remains appropriate;Frequency remains appropriate    Precautions / Restrictions Precautions Precautions: Fall Restrictions Weight Bearing Restrictions: No   Pertinent Vitals/Pain HR 87 at rest; 120-130 during exercise (particularly elevated when walking and talking at same time).  O2 sats 95% at rest on 1/2 L, 87% with ex on 1/2L, >90% with ex on 1-2L; required 2L O2 to stay >95% with ex.  No pain.     Mobility  Bed Mobility Bed Mobility: Not assessed Transfers Transfers: Stand to Sit;Sit to Stand Sit to Stand: With armrests;From chair/3-in-1;With upper extremity assist;4: Min guard Stand to Sit: To chair/3-in-1;4: Min guard;With upper extremity assist;With armrests Details for Transfer Assistance: pt able to scoot to edge of chair, stand and sit with appropriate arm placement, however needed vc to slow down for safety.   Ambulation/Gait Ambulation/Gait Assistance: 4: Min guard Ambulation Distance (Feet): 200 Feet Assistive device: Rolling walker Ambulation/Gait Assistance Details: Pt walked tall and only needed vc to walk close to walker and take deep breaths when she became fatigued.  Pt able to hold conversation while walking with minimal balance checks, however she became short of breath and her heart rate increased to 130 while walking and talking.  Pt returned to baseline within one minute once seated.  Gait Pattern: Step-through pattern;Decreased stride length Gait velocity: slowed Stairs: No Wheelchair Mobility Wheelchair Mobility: No       PT Goals Acute Rehab PT Goals PT Goal: Sit to Stand - Progress: Progressing toward goal PT Goal: Stand to Sit - Progress: Progressing toward goal PT Goal: Ambulate - Progress: Progressing toward goal  Visit Information  Last PT Received On: 05/30/12 Assistance Needed: +2    Subjective Data  Subjective: "I'm doing well and we can walk."  Patient Stated Goal: home   Cognition  Overall Cognitive Status: Appears within functional limits for tasks assessed/performed Arousal/Alertness: Awake/alert Orientation Level: Appears intact for tasks assessed Behavior During Session: [facility] for tasks performed    Balance  Balance Balance Assessed: Yes Dynamic Standing Balance Dynamic Standing - Balance Support: Bilateral upper extremity supported;During functional activity Dynamic Standing - Level of Assistance: 4: Min assist Dynamic Standing - Balance Activities: Lateral lean/weight shifting Dynamic Standing - Comments: standing marches and hip abduction with bilat. hand held assistance for balance. This was challenging for pt and caused O2 sats to drop to 92%; recovered with seated rest.   End of Session PT -  End of Session Equipment Utilized During Treatment: Gait belt;Oxygen (Started at 1/2L O2 and required 2L O2 to stay >90%) Activity Tolerance: Patient  tolerated treatment well;Patient limited by fatigue Patient left: in chair;with family/visitor present;with call bell/phone within reach Nurse Communication: Mobility status       Sharion Balloon 05/30/2012, 12:11 PM  Sharion Balloon, SPT Acute Rehab Services [phone]

## 2012-05-30 NOTE — Progress Notes (Signed)
SATURATION QUALIFICATIONS:  Patient Saturations on Room Air at Rest = 95%  Patient Saturations on Room Air while Ambulating = 87%  Patient Saturations on 2 Liters of oxygen while Ambulating = 93%  [employer] Acute Rehabilitation [phone] (pager)

## 2012-05-30 NOTE — Progress Notes (Signed)
Nicole [name] [id number] February 08, 1931   Subjective:  Feeling better Working crossword in bed No new events Not bloated w diet   Objective:  Vital signs:  Filed Vitals:   05/29/12 1410 05/29/12 2100 05/30/12 0400 05/30/12 0500  BP: 111/65 146/92 150/123 137/79  Pulse: 84 88 85 72  Temp: 98.4 F (36.9 C) 99.1 F (37.3 C) 99.1 F (37.3 C)   TempSrc:  Oral Oral   Resp: 18 18 19    Height:      Weight:      SpO2: 97% 95%      Last BM Date: 05/29/12  Intake/Output   Yesterday:  11/07 0701 - 11/08 0700 In: 720 [P.O.:720] Out: 1151 [Urine:1150; Stool:1] This shift:     Bowel function:  Flatus: y  BM: Yes  Physical Exam:  General: Pt awake/alert/oriented x4 in no acute distress Eyes: PERRL, normal EOM.  Sclera clear.  No icterus Neuro: CN II-XII intact w/o focal sensory/motor deficits. Lymph: No head/neck/groin lymphadenopathy Psych:  No delerium/psychosis/paranoia HENT: Normocephalic, Mucus membranes moist.  No thrush Neck: Supple, No tracheal deviation Chest: No chest wall pain w good excursion CV:  Pulses intact.  Regular rhythm MS: Normal AROM mjr joints.  No obvious deformity Abdomen: Soft/Obese  Nondistended.  No incarcerated hernias.  Hematoma smaller 15x15cm softer & nontender periumb Ext:  SCDs BLE.  No mjr edema.  No cyanosis Skin: No petechiae / purpurae  Problem List:  Principal Problem:  *Cholecystitis chronic, acute, S/P lap cholecystectomy 05/14/13 complicated by bile leak, S/P ERCP Active Problems:  History of colon cancer, stage III  Thoracic aortic aneurysm, 4.8cm 2011  CHF, past NICM with an EF of 30%, most recent echo 2011 showed EF to be45- 50%  CAF not felt to be a Coumadin candidate in the past, rate currently controlled  Acute on chronic renal insufficiency, improved  Normal coronary arteries, 2008  Diabetes mellitus  HTN (hypertension)  Respiratory distress post op secondary to rapid AF  Ileus post op. still  NPO   Assessment  Nicole [name]  78 y.o. female  13 Days Post-Op  Procedure(s): ENDOSCOPIC RETROGRADE CHOLANGIOPANCREATOGRAPHY (ERCP)  Improved  Plan:  -low fat solid diet -bowel regimen w psyllium -PO cardizem per Card recs -DM per IM.   -WBC WNL on Abx - last dose Zosyn today -VTE prophylaxis- SCDs, etc -mobilize as tolerated to help recovery  -OK to d/c from surgery standpoint.  Will sign off & f/u in clinic in 2 weeks Keep appt w GI for CBD stent f/u ?removal  Ardeth Sportsman, M.D., F.A.C.S. Gastrointestinal and Minimally Invasive Surgery [facility] [address], [address] [place], [place] [address] [phone] Main / Paging [phone] Voice Mail   05/30/2012  CARE TEAM:  PCP: Bufford Spikes, DO  Outpatient Care Team: Patient Care Team: Kermit Balo, DO as PCP - General (Geriatric Medicine) Currie Paris, MD as Consulting Physician (General Surgery)  Inpatient Treatment Team: Treatment Team: Attending Provider: Laveda Norman, MD; Consulting Physician: Bishop Limbo, MD; Registered Nurse: Jerene Dilling, RN; Registered Nurse: Cindra Eves, RN; Consulting Physician: Shirley Friar, MD; Consulting Physician: Barrie Folk, MD; Attending Physician: Calvert Cantor, MD; Dietitian: Hettie Holstein, RD; Rounding Team: Dorothyann Gibbs, MD; Technician: Christiane Ha, [place]; Physical Therapist: Barb Merino, PT; Physical Therapist: Sharion Balloon, Student-PT   Results:   Labs: Results for orders placed during the hospital encounter of 05/12/12 (from the past 48 hour(s))  GLUCOSE,  CAPILLARY     Status: Abnormal   Collection Time   05/28/12 12:03 PM      Component Value Range Comment   Glucose-Capillary 119 (*) 70 - 99 mg/dL   GLUCOSE, CAPILLARY     Status: Abnormal   Collection Time   05/28/12  4:55 PM      Component Value Range Comment   Glucose-Capillary 131 (*) 70 - 99 mg/dL   GLUCOSE, CAPILLARY     Status: Abnormal    Collection Time   05/28/12  9:13 PM      Component Value Range Comment   Glucose-Capillary 102 (*) 70 - 99 mg/dL    Comment 1 Documented in Chart      Comment 2 Notify RN     CBC WITH DIFFERENTIAL     Status: Abnormal   Collection Time   05/29/12  4:10 AM      Component Value Range Comment   WBC 10.4  4.0 - 10.5 K/uL    RBC 3.28 (*) 3.87 - 5.11 MIL/uL    Hemoglobin 9.3 (*) 12.0 - 15.0 g/dL    HCT 82.9 (*) 56.2 - 46.0 %    MCV 82.3  78.0 - 100.0 fL    MCH 28.4  26.0 - 34.0 pg    MCHC 34.4  30.0 - 36.0 g/dL    RDW 13.0 (*) 86.5 - 15.5 %    Platelets 528 (*) 150 - 400 K/uL    Neutrophils Relative 80 (*) 43 - 77 %    Neutro Abs 8.3 (*) 1.7 - 7.7 K/uL    Lymphocytes Relative 8 (*) 12 - 46 %    Lymphs Abs 0.8  0.7 - 4.0 K/uL    Monocytes Relative 10  3 - 12 %    Monocytes Absolute 1.1 (*) 0.1 - 1.0 K/uL    Eosinophils Relative 2  0 - 5 %    Eosinophils Absolute 0.2  0.0 - 0.7 K/uL    Basophils Relative 0  0 - 1 %    Basophils Absolute 0.0  0.0 - 0.1 K/uL   COMPREHENSIVE METABOLIC PANEL     Status: Abnormal   Collection Time   05/29/12  4:10 AM      Component Value Range Comment   Sodium 141  135 - 145 mEq/L    Potassium 3.9  3.5 - 5.1 mEq/L    Chloride 103  96 - 112 mEq/L    CO2 30  19 - 32 mEq/L    Glucose, Bld 111 (*) 70 - 99 mg/dL    BUN 13  6 - 23 mg/dL    Creatinine, Ser 7.84  0.50 - 1.10 mg/dL    Calcium 8.4  8.4 - 69.6 mg/dL    Total Protein 6.4  6.0 - 8.3 g/dL    Albumin 2.1 (*) 3.5 - 5.2 g/dL    AST 25  0 - 37 U/L    ALT 20  0 - 35 U/L    Alkaline Phosphatase 113  39 - 117 U/L    Total Bilirubin 0.4  0.3 - 1.2 mg/dL    GFR calc non Af Amer 57 (*) >90 mL/min    GFR calc Af Amer 66 (*) >90 mL/min   GLUCOSE, CAPILLARY     Status: Abnormal   Collection Time   05/29/12  6:50 AM      Component Value Range Comment   Glucose-Capillary 105 (*) 70 - 99 mg/dL   GLUCOSE, CAPILLARY  Status: Abnormal   Collection Time   05/29/12 11:10 AM      Component Value Range  Comment   Glucose-Capillary 156 (*) 70 - 99 mg/dL   GLUCOSE, CAPILLARY     Status: Abnormal   Collection Time   05/29/12  4:21 PM      Component Value Range Comment   Glucose-Capillary 135 (*) 70 - 99 mg/dL   GLUCOSE, CAPILLARY     Status: Abnormal   Collection Time   05/29/12  9:16 PM      Component Value Range Comment   Glucose-Capillary 113 (*) 70 - 99 mg/dL   CBC     Status: Abnormal   Collection Time   05/30/12  5:50 AM      Component Value Range Comment   WBC 11.1 (*) 4.0 - 10.5 K/uL    RBC 2.91 (*) 3.87 - 5.11 MIL/uL    Hemoglobin 8.1 (*) 12.0 - 15.0 g/dL    HCT 16.1 (*) 09.6 - 46.0 %    MCV 81.8  78.0 - 100.0 fL    MCH 27.8  26.0 - 34.0 pg    MCHC 34.0  30.0 - 36.0 g/dL    RDW 04.5 (*) 40.9 - 15.5 %    Platelets 580 (*) 150 - 400 K/uL   BASIC METABOLIC PANEL     Status: Abnormal   Collection Time   05/30/12  5:50 AM      Component Value Range Comment   Sodium 143  135 - 145 mEq/L    Potassium 3.7  3.5 - 5.1 mEq/L    Chloride 106  96 - 112 mEq/L    CO2 32  19 - 32 mEq/L    Glucose, Bld 124 (*) 70 - 99 mg/dL    BUN 11  6 - 23 mg/dL    Creatinine, Ser 8.11  0.50 - 1.10 mg/dL    Calcium 8.3 (*) 8.4 - 10.5 mg/dL    GFR calc non Af Amer 51 (*) >90 mL/min    GFR calc Af Amer 59 (*) >90 mL/min   GLUCOSE, CAPILLARY     Status: Abnormal   Collection Time   05/30/12  6:37 AM      Component Value Range Comment   Glucose-Capillary 121 (*) 70 - 99 mg/dL    Comment 1 Notify RN       Imaging / Studies: No results found.  Medications / Allergies: per chart  Antibiotics: Anti-infectives     Start     Dose/Rate Route Frequency Ordered Stop   05/21/12 0900  piperacillin-tazobactam (ZOSYN) IVPB 3.375 g       3.375 g 12.5 mL/hr over 240 Minutes Intravenous Every 8 hours 05/21/12 0827 05/30/12 1013   05/18/12 2200   ciprofloxacin (CIPRO) IVPB 400 mg  Status:  Discontinued        400 mg 200 mL/hr over 60 Minutes Intravenous Every 12 hours 05/18/12 1457 05/21/12 0759    05/16/12 1000   ciprofloxacin (CIPRO) IVPB 200 mg  Status:  Discontinued        200 mg 100 mL/hr over 60 Minutes Intravenous Every 24 hours 05/16/12 0846 05/18/12 1457   05/16/12 0800   metroNIDAZOLE (FLAGYL) IVPB 500 mg  Status:  Discontinued        500 mg 100 mL/hr over 60 Minutes Intravenous Every 8 hours 05/16/12 0735 05/21/12 0759   05/14/12 0600   ceFAZolin (ANCEF) IVPB 2 g/50 mL premix  2 g 100 mL/hr over 30 Minutes Intravenous On call to O.R. 05/13/12 1424 05/14/12 1407

## 2012-05-31 LAB — CBC
MCH: 27.7 pg (ref 26.0–34.0)
MCV: 81 fL (ref 78.0–100.0)
Platelets: 596 10*3/uL — ABNORMAL HIGH (ref 150–400)
RDW: 16 % — ABNORMAL HIGH (ref 11.5–15.5)

## 2012-05-31 LAB — BASIC METABOLIC PANEL
Calcium: 8.7 mg/dL (ref 8.4–10.5)
Creatinine, Ser: 0.87 mg/dL (ref 0.50–1.10)
GFR calc Af Amer: 72 mL/min — ABNORMAL LOW (ref >90)

## 2012-05-31 LAB — GLUCOSE, CAPILLARY
Glucose-Capillary: 152 mg/dL — ABNORMAL HIGH (ref 70–99)
Glucose-Capillary: 115 mg/dL — ABNORMAL HIGH (ref 70–99)

## 2012-05-31 NOTE — Progress Notes (Signed)
[facility] PROGRESS NOTE  Nicole [name] ZOX:[id number] DOB: 01-Aug-1930 DOA: 05/12/2012 PCP: Bufford Spikes, DO  Assessment/Plan: Principal Problem:  *Leakage of common bile duct s/p ERCP / stent Active Problems:  History of colon cancer, stage III  Cholecystitis chronic, acute, S/P lap cholecystectomy 05/14/12  Thoracic aortic aneurysm, 4.8cm 2011  CHF, past NICM with an EF of 30%, most recent echo 2011 showed EF to be45- 50%  CAF not felt to be a Coumadin candidate in the past, rate currently controlled  Acute on chronic renal insufficiency, improved  Normal coronary arteries, 2008  Diabetes mellitus  HTN (hypertension)  Respiratory distress post op secondary to rapid AF  Ileus post op. still NPO   1. Acute Cholecystitis/Bile Leak  Patient presented with 2 -3 days of persistent RUQ abdominal pain, bloating, nausea and bilious vomiting, and ultrasound revealed symptomatic cholelithiasis. Surgical consultation  Was provided by Dr Manus Rudd. Patient is s/p Lap cholecystectomy on 05/14/12. This was unfortunately complicated by abdominal perihepatic/abdominal wall hematoma and bile leak. S/P ERCP with Stent placement 05/17/12. She was initially treated with empiric Ciprofloxacin/Flagyl, but was switched to iv Zosyn on 05/21/12, due to persisting leukocytosis. CT abdomen/pelvis 05/22/2012 revealed near resolution of fluid collection near the liver and no evidence of abscess. Managing per surgery, and patient is improving gradually. Hemoglobin has remained stable. Antibiotics were completed on 05/30/12.  2. Post-operative partial SBO/ileus: This was a further complication of surgery, and was managed with bowel rest, iv fluids. Fortunately, improvement is evident, and diet is being advanced per surgery. Patient is tolerating ow fat diet from 05/30/12. Stools are loose. C. Difficile PCR was negative.  3. Acute Renal Failure:   There is a high index of suspicion for hemodynamically  mediated renal injury, as patient was very briefly hypotensive on 05/15/12. This may have been due to intraabdominal hematoma/developing abdominal  wall hematoma. Now resolved.  4. Afib with RVR:  While in SDU, patient developed fast atrial fibrillation, which was addressed with iv Cardizem drip, and scheduled iv beta-blocker, with resultant rate-control. She was transitioned to oral Cardizem/beta-blocker on 05/26/12, without deleterious effect. Clearly, patient is not a candidate for anticoagulation given hematoma/potential for repeart surgery, more importantly has been deemed not a be candidate for anticoagulation by her cardiologist in the past. Possibly due to non-compliance. Patient was switched to Cardizem CD, from 05/29/12, without deleterious effect.  5. Acute hypoxic Resp Failure:  Patient had low Oxygen saturation in the SDU, which was readily corrected with Oxygen supplementation. Etiology is  Multi-factorial, due to compressive atelectasis/mechanical obstruction secondary to abdominal distention/bilateral pleural effusions. She is s/p thoracentesis 05/23/12 right lung with 470 cc of dark bloody fluid obtained. Fluid sent for culture and cytology. This has shown no growth to date. Now saturating at 92%-99% on room air.  7. Hypokalemia:   This was secondary to GI loss via NG suction. Repleted as indicated. Resolved.  8. Hypernatremia: Due to dehydration and volume depletion. Resolved with addition of free water  9. DM-uncontrolled. Managing with SSI. She  did have an episode of hypoglycemia, probably secondary to inadequate caloric intake. CBGs are now reasonable. HBA1C is 6.0.  10. CHF (systolic EF 45% and diastolic dysfunction-grade 1)  No clinical evidence of decompensation.  11. Hypothyroidism: On Synthroid re[placement therapy.  12. HTN:  Sub-optimally controlled, initially. Adjusted antihypertensives as indicated, with satisfactory control. 13: Anemia: HB was 7.7 on 05/28/12.  Multifactorial, due to chronic disease, acute blood loss and critical illness. Following CBC, and Hemoglobin  is improved at 9.3 as of 05/29/12.    Code Status: Full Code.  Family Communication:  Disposition Plan: Per surgery. Nearing discharge. Will need HHPT/RN/Rolling walker.    Brief narrative: 78 yo female presenting with 2 -3 days of persistent RUQ abdominal pain, bloating, nausea and bilious vomiting, which is worse after eating. No fever, no diarrhea. Came to ED on 05/11/12 and felt to have symptomatic cholelithiasis per imaging study. Surgical team at that time, recommended discontinuation of Plavix/ASA and f/u as outpt to set up elective cholecystectomy. Patient however, re-presented on 05/12/12, with uncontrolled pain and vomiting despite zofran and pain meds at home. She was admitted, and subsequently underwent a laparoscopic cholecystectomy on 05/14/2012. Postoperatively She developed severe abdominal pain associated with nausea and vomiting. HIDA scan revealed bile leak. In the interim she developed atrial fibrillation with RVR and was subsequently started on Cardizem drip. CT scan performed demonstrated small bowel obstruction that warranted NG tube placement and surgical consultation. She has subsequently undergone ERCP with stent placement on 05/17/2012. Patient was transferred to the medical floor, from SDU on 05/27/12.     Consultants:  Dr Manus Rudd, surgeon.  Dr Zoila Shutter, cardiologist.  Dr Casimiro Needle, nephrologist.  Dr Charlott Rakes, gastroenterologist.   Procedures:  See A/P  Antibiotics:  Zosyn 05/21/12>>>  HPI/Subjective: No new issues. In good spirits.    Objective: Vital signs in last 24 hours: Temp:  [98 F (36.7 C)-98.6 F (37 C)] 98.4 F (36.9 C) (11/09 0411) Pulse Rate:  [77-87] 77  (11/09 0411) Resp:  [18-20] 18  (11/09 0411) BP: (135-145)/(66-106) 145/106 mmHg (11/09 0411) SpO2:  [92 %-99 %] 99 % (11/09 0411) Weight change:  Last  BM Date: 05/30/12  Intake/Output from previous day: 11/08 0701 - 11/09 0700 In: 480 [P.O.:480] Out: 1752 [Urine:1750; Stool:2]     Physical Exam: General: Comfortable in chair, alert, communicative, fully oriented, not short of breath at rest.  HEENT:  Moderate clinical pallor, no jaundice, no conjunctival injection or discharge. Hydration is satisfactory.  NECK:  Supple, JVP not seen, no carotid bruits, no palpable lymphadenopathy, no palpable goiter. CHEST:  Clinically clear to auscultation, no wheezes, no crackles. HEART:  Sounds 1 and 2 heard, normal, irregular, no murmurs. ABDOMEN:  Full, soft, non-tender. Bowel sounds are heard. GENITALIA:  Not examined.  LOWER EXTREMITIES:  Minimal edema, palpable peripheral pulses. MUSCULOSKELETAL SYSTEM:  Generalized osteoarthritic changes, otherwise, normal. CENTRAL NERVOUS SYSTEM:  No focal neurologic deficit on gross examination.  Lab Results:  Basename 05/31/12 0500 05/30/12 0550  WBC 9.4 11.1*  HGB 8.3* 8.1*  HCT 24.3* 23.8*  PLT 596* 580*    Basename 05/31/12 0500 05/30/12 0550  NA 142 143  K 3.7 3.7  CL 104 106  CO2 33* 32  GLUCOSE 145* 124*  BUN 8 11  CREATININE 0.87 1.02  CALCIUM 8.7 8.3*   Recent Results (from the past 240 hour(s))  CULTURE, BLOOD (ROUTINE X 2)     Status: Normal   Collection Time   05/23/12  8:54 AM      Component Value Range Status Comment   Specimen Description BLOOD LEFT ARM   Final    Special Requests BOTTLES DRAWN AEROBIC AND ANAEROBIC 10CC   Final    Culture  Setup Time 05/23/2012 15:31   Final    Culture NO GROWTH 5 DAYS   Final    Report Status 05/29/2012 FINAL   Final   CULTURE, BLOOD (ROUTINE X 2)  Status: Normal   Collection Time   05/23/12  9:13 AM      Component Value Range Status Comment   Specimen Description BLOOD LEFT HAND   Final    Special Requests BOTTLES DRAWN AEROBIC AND ANAEROBIC 10CC   Final    Culture  Setup Time 05/23/2012 15:31   Final    Culture NO GROWTH 5 DAYS    Final    Report Status 05/29/2012 FINAL   Final   BODY FLUID CULTURE     Status: Normal   Collection Time   05/23/12 12:03 PM      Component Value Range Status Comment   Specimen Description PLEURAL FLUID RIGHT   Final    Special Requests   Final    Gram Stain     Final    Value: RARE WBC PRESENT, PREDOMINANTLY PMN     NO ORGANISMS SEEN   Culture NO GROWTH 3 DAYS   Final    Report Status 05/27/2012 FINAL   Final   URINE CULTURE     Status: Normal   Collection Time   05/23/12  1:27 PM      Component Value Range Status Comment   Specimen Description URINE, CATHETERIZED   Final    Special Requests NONE   Final    Culture  Setup Time 05/23/2012 14:10   Final    Colony Count NO GROWTH   Final    Culture NO GROWTH   Final    Report Status 05/24/2012 FINAL   Final   CLOSTRIDIUM DIFFICILE BY PCR     Status: Normal   Collection Time   05/30/12 11:30 AM      Component Value Range Status Comment   C difficile by pcr NEGATIVE  NEGATIVE Final      Studies/Results: No results found.  Medications: Scheduled Meds:    . allopurinol  100 mg Oral Daily  . atorvastatin  40 mg Oral Daily  . diltiazem  240 mg Oral Daily  . feeding supplement  1 Container Oral TID WC  . furosemide  40 mg Oral Daily  . insulin aspart  0-5 Units Subcutaneous QHS  . insulin aspart  0-9 Units Subcutaneous TID WC  . levothyroxine  50 mcg Oral Q breakfast  . lip balm   Topical BID  . metoprolol tartrate  50 mg Oral BID  . potassium chloride  10 mEq Oral Daily  . psyllium  1 packet Oral BID  . saccharomyces boulardii  250 mg Oral BID  . sodium chloride  10-40 mL Intracatheter Q12H   Continuous Infusions:    . sodium chloride Stopped (05/23/12 2100)   PRN Meds:.acetaminophen, alum & mag hydroxide-simeth, bisacodyl, hydrALAZINE, morphine injection, ondansetron (ZOFRAN) IV, phenol, promethazine, simethicone, sodium chloride    LOS: 19 days   Shamanda Len,CHRISTOPHER  [facility] Pager [phone]. If  8PM-8AM, please contact night-coverage at [email or web address], password [facility] 05/31/2012, 12:56 PM  LOS: 19 days

## 2012-06-01 ENCOUNTER — Inpatient Hospital Stay (HOSPITAL_COMMUNITY): Payer: Medicare Other

## 2012-06-01 DIAGNOSIS — M109 Gout, unspecified: Secondary | ICD-10-CM | POA: Diagnosis present

## 2012-06-01 LAB — GLUCOSE, CAPILLARY
Glucose-Capillary: 163 mg/dL — ABNORMAL HIGH (ref 70–99)
Glucose-Capillary: 146 mg/dL — ABNORMAL HIGH (ref 70–99)
Glucose-Capillary: 212 mg/dL — ABNORMAL HIGH (ref 70–99)

## 2012-06-01 MED ORDER — METHYLPREDNISOLONE SODIUM SUCC 40 MG IJ SOLR
40.0000 mg | Freq: Three times a day (TID) | INTRAMUSCULAR | Status: AC
Start: 2012-06-01 — End: 2012-06-02
  Administered 2012-06-01 – 2012-06-02 (×3): 40 mg via INTRAVENOUS
  Filled 2012-06-01 (×3): qty 1

## 2012-06-01 MED ORDER — PREDNISONE 20 MG PO TABS
40.0000 mg | ORAL_TABLET | Freq: Once | ORAL | Status: DC
  Filled 2012-06-01: qty 2

## 2012-06-01 MED ORDER — TRAMADOL HCL 50 MG PO TABS
25.0000 mg | ORAL_TABLET | Freq: Three times a day (TID) | ORAL | Status: DC
  Administered 2012-06-01 – 2012-06-02 (×3): 25 mg via ORAL
  Filled 2012-06-01 (×2): qty 1
  Filled 2012-06-01: qty 2
  Filled 2012-06-01: qty 1

## 2012-06-01 MED ORDER — PREDNISONE 20 MG PO TABS
40.0000 mg | ORAL_TABLET | Freq: Every day | ORAL | Status: DC
  Administered 2012-06-02: 40 mg via ORAL
  Filled 2012-06-01 (×2): qty 2

## 2012-06-01 NOTE — Progress Notes (Signed)
[facility] PROGRESS NOTE  Nicole [name] ZOX:[id number] DOB: 01-Aug-1930 DOA: 05/12/2012 PCP: Bufford Spikes, DO  Assessment/Plan: Principal Problem:  *Leakage of common bile duct s/p ERCP / stent Active Problems:  History of colon cancer, stage III  Cholecystitis chronic, acute, S/P lap cholecystectomy 05/14/12  Thoracic aortic aneurysm, 4.8cm 2011  CHF, past NICM with an EF of 30%, most recent echo 2011 showed EF to be45- 50%  CAF not felt to be a Coumadin candidate in the past, rate currently controlled  Acute on chronic renal insufficiency, improved  Normal coronary arteries, 2008  Diabetes mellitus  HTN (hypertension)  Respiratory distress post op secondary to rapid AF  Ileus post op. still NPO   1. Acute Cholecystitis/Bile Leak  Patient presented with 2 -3 days of persistent RUQ abdominal pain, bloating, nausea and bilious vomiting, and ultrasound revealed symptomatic cholelithiasis. Surgical consultation  Was provided by Dr Manus Rudd. Patient is s/p Lap cholecystectomy on 05/14/12. This was unfortunately complicated by abdominal perihepatic/abdominal wall hematoma and bile leak. S/P ERCP with Stent placement 05/17/12. She was initially treated with empiric Ciprofloxacin/Flagyl, but was switched to iv Zosyn on 05/21/12, due to persisting leukocytosis. CT abdomen/pelvis 05/22/2012 revealed near resolution of fluid collection near the liver and no evidence of abscess. Managing per surgery, and patient is improving gradually. Hemoglobin has remained stable. Antibiotics were completed on 05/30/12.  2. Post-operative partial SBO/ileus: This was a further complication of surgery, and was managed with bowel rest, iv fluids. Fortunately, improvement is evident, and diet is being advanced per surgery. Patient is tolerating ow fat diet from 05/30/12. Stools are loose. C. Difficile PCR was negative.  3. Acute Renal Failure:   There is a high index of suspicion for hemodynamically  mediated renal injury, as patient was very briefly hypotensive on 05/15/12. This may have been due to intraabdominal hematoma/developing abdominal  wall hematoma. Now resolved.  4. Afib with RVR:  While in SDU, patient developed fast atrial fibrillation, which was addressed with iv Cardizem drip, and scheduled iv beta-blocker, with resultant rate-control. She was transitioned to oral Cardizem/beta-blocker on 05/26/12, without deleterious effect. Clearly, patient is not a candidate for anticoagulation given hematoma/potential for repeart surgery, more importantly has been deemed not a be candidate for anticoagulation by her cardiologist in the past. Possibly due to non-compliance. Patient was switched to Cardizem CD, from 05/29/12, without deleterious effect.  5. Acute hypoxic Resp Failure:  Patient had low Oxygen saturation in the SDU, which was readily corrected with Oxygen supplementation. Etiology is  Multi-factorial, due to compressive atelectasis/mechanical obstruction secondary to abdominal distention/bilateral pleural effusions. She is s/p thoracentesis 05/23/12 right lung with 470 cc of dark bloody fluid obtained. Fluid sent for culture and cytology. This has shown no growth to date. Now saturating at 92%-99% on room air.  7. Hypokalemia:   This was secondary to GI loss via NG suction. Repleted as indicated. Resolved.  8. Hypernatremia: Due to dehydration and volume depletion. Resolved with addition of free water  9. DM-uncontrolled. Managing with SSI. She  did have an episode of hypoglycemia, probably secondary to inadequate caloric intake. CBGs are now reasonable. HBA1C is 6.0.  10. CHF (systolic EF 45% and diastolic dysfunction-grade 1)  No clinical evidence of decompensation.  11. Hypothyroidism: On Synthroid re[placement therapy.  12. HTN:  Sub-optimally controlled, initially. Adjusted antihypertensives as indicated, with satisfactory control. 13: Anemia: HB was 7.7 on 05/28/12.  Multifactorial, due to chronic disease, acute blood loss and critical illness. Following CBC, and Hemoglobin  is improved at 9.3 as of 05/29/12.  14: Right wrist pain: Patient has had right wrist pain since 05/31/12, worse today. No history of trauma, and ROM is restricted and she has increased local temperature, but no redness. She does have a known history of gout, so I suspect this is an acute flare. Managing with wrist support, analgesics and steroids. X-ray ordered.    Code Status: Full Code.  Family Communication:  Disposition Plan: Per surgery. Aiming discharge on 06/02/12. Will need HHPT/RN/Rolling walker.    Brief narrative: 78 yo female presenting with 2 -3 days of persistent RUQ abdominal pain, bloating, nausea and bilious vomiting, which is worse after eating. No fever, no diarrhea. Came to ED on 05/11/12 and felt to have symptomatic cholelithiasis per imaging study. Surgical team at that time, recommended discontinuation of Plavix/ASA and f/u as outpt to set up elective cholecystectomy. Patient however, re-presented on 05/12/12, with uncontrolled pain and vomiting despite zofran and pain meds at home. She was admitted, and subsequently underwent a laparoscopic cholecystectomy on 05/14/2012. Postoperatively She developed severe abdominal pain associated with nausea and vomiting. HIDA scan revealed bile leak. In the interim she developed atrial fibrillation with RVR and was subsequently started on Cardizem drip. CT scan performed demonstrated small bowel obstruction that warranted NG tube placement and surgical consultation. She has subsequently undergone ERCP with stent placement on 05/17/2012. Patient was transferred to the medical floor, from SDU on 05/27/12.     Consultants:  Dr Manus Rudd, surgeon.  Dr Zoila Shutter, cardiologist.  Dr Casimiro Needle, nephrologist.  Dr Charlott Rakes, gastroenterologist.   Procedures:  See A/P  Antibiotics:  Zosyn  05/21/12>>>  HPI/Subjective: C/O Right wrist pain. No new issues.    Objective: Vital signs in last 24 hours: Temp:  [98.1 F (36.7 C)-98.7 F (37.1 C)] 98.3 F (36.8 C) (11/10 0247) Pulse Rate:  [72-84] 84  (11/10 0247) Resp:  [18] 18  (11/10 0247) BP: (116-154)/(62-83) 154/83 mmHg (11/10 0247) SpO2:  [98 %-99 %] 98 % (11/09 2148) FiO2 (%):  [2 %] 2 % (11/09 1401) Weight change:  Last BM Date: 05/30/12  Intake/Output from previous day: 11/09 0701 - 11/10 0700 In: 480 [P.O.:480] Out: 951 [Urine:950; Stool:1]     Physical Exam: General: Comfortable in chair, alert, communicative, fully oriented, not short of breath at rest.  HEENT:  Moderate clinical pallor, no jaundice, no conjunctival injection or discharge. Hydration is satisfactory.  NECK:  Supple, JVP not seen, no carotid bruits, no palpable lymphadenopathy, no palpable goiter. CHEST:  Clinically clear to auscultation, no wheezes, no crackles. HEART:  Sounds 1 and 2 heard, normal, irregular, no murmurs. ABDOMEN:  Full, soft, non-tender. Bowel sounds are heard. GENITALIA:  Not examined.  LOWER EXTREMITIES:  Minimal edema, palpable peripheral pulses. MUSCULOSKELETAL SYSTEM:  Has tenderness, swelling and increased local temperature right wrist, with diminished ROM, otherwise, generalized osteoarthritic changes. CENTRAL NERVOUS SYSTEM:  No focal neurologic deficit on gross examination.  Lab Results:  Basename 05/31/12 0500 05/30/12 0550  WBC 9.4 11.1*  HGB 8.3* 8.1*  HCT 24.3* 23.8*  PLT 596* 580*    Basename 05/31/12 0500 05/30/12 0550  NA 142 143  K 3.7 3.7  CL 104 106  CO2 33* 32  GLUCOSE 145* 124*  BUN 8 11  CREATININE 0.87 1.02  CALCIUM 8.7 8.3*   Recent Results (from the past 240 hour(s))  CULTURE, BLOOD (ROUTINE X 2)     Status: Normal   Collection Time   05/23/12  8:54 AM      Component Value Range Status Comment   Specimen Description BLOOD LEFT ARM   Final    Special Requests BOTTLES DRAWN  AEROBIC AND ANAEROBIC 10CC   Final    Culture  Setup Time 05/23/2012 15:31   Final    Culture NO GROWTH 5 DAYS   Final    Report Status 05/29/2012 FINAL   Final   CULTURE, BLOOD (ROUTINE X 2)     Status: Normal   Collection Time   05/23/12  9:13 AM      Component Value Range Status Comment   Specimen Description BLOOD LEFT HAND   Final    Special Requests BOTTLES DRAWN AEROBIC AND ANAEROBIC 10CC   Final    Culture  Setup Time 05/23/2012 15:31   Final    Culture NO GROWTH 5 DAYS   Final    Report Status 05/29/2012 FINAL   Final   BODY FLUID CULTURE     Status: Normal   Collection Time   05/23/12 12:03 PM      Component Value Range Status Comment   Specimen Description PLEURAL FLUID RIGHT   Final    Special Requests   Final    Gram Stain     Final    Value: RARE WBC PRESENT, PREDOMINANTLY PMN     NO ORGANISMS SEEN   Culture NO GROWTH 3 DAYS   Final    Report Status 05/27/2012 FINAL   Final   URINE CULTURE     Status: Normal   Collection Time   05/23/12  1:27 PM      Component Value Range Status Comment   Specimen Description URINE, CATHETERIZED   Final    Special Requests NONE   Final    Culture  Setup Time 05/23/2012 14:10   Final    Colony Count NO GROWTH   Final    Culture NO GROWTH   Final    Report Status 05/24/2012 FINAL   Final   CLOSTRIDIUM DIFFICILE BY PCR     Status: Normal   Collection Time   05/30/12 11:30 AM      Component Value Range Status Comment   C difficile by pcr NEGATIVE  NEGATIVE Final      Studies/Results: No results found.  Medications: Scheduled Meds:    . allopurinol  100 mg Oral Daily  . atorvastatin  40 mg Oral Daily  . diltiazem  240 mg Oral Daily  . feeding supplement  1 Container Oral TID WC  . furosemide  40 mg Oral Daily  . insulin aspart  0-5 Units Subcutaneous QHS  . insulin aspart  0-9 Units Subcutaneous TID WC  . levothyroxine  50 mcg Oral Q breakfast  . lip balm   Topical BID  . methylPREDNISolone (SOLU-MEDROL) injection   40 mg Intravenous Q8H  . metoprolol tartrate  50 mg Oral BID  . potassium chloride  10 mEq Oral Daily  . predniSONE  40 mg Oral Q breakfast  . psyllium  1 packet Oral BID  . saccharomyces boulardii  250 mg Oral BID  . sodium chloride  10-40 mL Intracatheter Q12H  . traMADol  25 mg Oral TID  . [DISCONTINUED] predniSONE  40 mg Oral Once   Continuous Infusions:    . sodium chloride Stopped (05/23/12 2100)   PRN Meds:.acetaminophen, alum & mag hydroxide-simeth, bisacodyl, hydrALAZINE, morphine injection, ondansetron (ZOFRAN) IV, phenol, promethazine, simethicone, sodium chloride    LOS: 20 days  Jequan Shahin,CHRISTOPHER  [facility] Pager [phone]. If 8PM-8AM, please contact night-coverage at [email or web address], password [facility] 06/01/2012, 10:42 AM  LOS: 20 days

## 2012-06-01 NOTE — Progress Notes (Signed)
Orthopedic Tech Progress Note Patient Details:  Nicole [name] [birth year] [id number] Velcro wrist splint applied to Right wrist. Patient alert and able to take instruction on how to apply. Wrist bands also on right wrist but able to move far enough up forearm to still be seen.  Ortho Devices Type of Ortho Device: Velcro wrist splint Ortho Device/Splint Location: Right UE Ortho Device/Splint Interventions: Application   Asia R Thompson 06/01/2012, 9:48 AM

## 2012-06-01 NOTE — Discharge Summary (Addendum)
Physician Discharge Summary  Nicole [name] ZOX:[id number] DOB: [birth year] DOA: 05/12/2012  PCP: Bufford Spikes, DO  Admit date: 05/12/2012 Discharge date: 06/02/2012  Time spent: 40 minutes  Recommendations for Outpatient Follow-up:  1. Follow up with primary MD 2. Follow up with Dr Cyndia Bent, general surgeon, in 2 weeks. 3. Keep appointment with Dr Dorena Cookey, GI for CBD stent follow up.  4. HHPT/RN/Rolling walker.   Discharge Diagnoses:  Principal Problem:  *Leakage of common bile duct s/p ERCP / stent Active Problems:  History of colon cancer, stage III  Cholecystitis chronic, acute, S/P lap cholecystectomy 05/14/12  Thoracic aortic aneurysm, 4.8cm 2011  CHF, past NICM with an EF of 30%, most recent echo 2011 showed EF to be45- 50%  CAF not felt to be a Coumadin candidate in the past, rate currently controlled  Acute on chronic renal insufficiency, improved  Normal coronary arteries, 2008  Diabetes mellitus  HTN (hypertension)  Respiratory distress post op secondary to rapid AF  Ileus post op. still NPO  Gout flare   Discharge Condition: Satisfactory.  Diet recommendation: Heart-Healthy/Carbohydrate-Modified  Filed Weights   05/13/12 0002 05/16/12 0400 05/29/12 0646  Weight: 80 kg (176 lb 5.9 oz) 84 kg (185 lb 3 oz) 85.1 kg (187 lb 9.8 oz)    History of present illness:  78 yo female presenting with 2 -3 days of persistent RUQ abdominal pain, bloating, nausea and bilious vomiting, which is worse after eating. No fever, no diarrhea. Came to ED on 05/11/12 and felt to have symptomatic cholelithiasis per imaging study. Surgical team at that time, recommended discontinuation of Plavix/ASA and f/u as outpt to set up elective cholecystectomy. Patient however, re-presented on 05/12/12, with uncontrolled pain and vomiting despite zofran and pain meds at home. She was admitted, and subsequently underwent a laparoscopic cholecystectomy on 05/14/2012. Postoperatively She  developed severe abdominal pain associated with nausea and vomiting. HIDA scan revealed bile leak. In the interim she developed atrial fibrillation with RVR and was subsequently started on Cardizem drip. CT scan performed demonstrated small bowel obstruction that warranted NG tube placement and surgical consultation. She has subsequently undergone ERCP with stent placement on 05/17/2012. Patient was transferred to the medical floor, from SDU on 05/27/12.    Hospital Course:  1. Acute Cholecystitis/Bile Leak  Patient presented with 2 -3 days of persistent RUQ abdominal pain, bloating, nausea and bilious vomiting, and ultrasound revealed symptomatic cholelithiasis. Surgical consultation Was provided by Dr Manus Rudd. Patient is s/p Lap cholecystectomy on 05/14/12. This was unfortunately complicated by abdominal perihepatic/abdominal wall hematoma and bile leak. S/P ERCP with stent placement 05/17/12. She was initially treated with empiric Ciprofloxacin/Flagyl, but was switched to iv Zosyn on 05/21/12, due to persisting leukocytosis. CT abdomen/pelvis 05/22/2012 revealed near resolution of fluid collection near the liver and no evidence of abscess. Hemoglobin has remained stable. Antibiotics were completed on 05/30/12. Clinically, patient has markedly improved.  2. Post-operative partial SBO/ileus: This was a further complication of surgery, and was managed with bowel rest, iv fluids, with resolution. Diet is was gradually advanced, and as of 05/30/12, patient was tolerating low fat diet. Although stools were occasionally loose, C. Difficile PCR was negative.  3. Acute Renal Failure:  There is a high index of suspicion for hemodynamically mediated renal injury, as patient was very briefly hypotensive on 05/15/12. This may have been due to intraabdominal hematoma/developing abdominal wall hematoma. Now resolved, and renal indices have normalized.  4. Afib with RVR:  While in SDU, patient developed  fast atrial  fibrillation, which was addressed with iv Cardizem drip, and scheduled iv beta-blocker, with resultant rate-control. She was transitioned to oral Cardizem/beta-blocker on 05/26/12, without deleterious effect. Clearly, patient is not a candidate for anticoagulation given hematoma/potential for repeart surgery. Of note, she has been deemed not a candidate for anticoagulation by her cardiologist in the past, possibly due to non-compliance. Patient was switched to Cardizem CD, from 05/29/12, without deleterious effect.  5. Acute hypoxic Resp Failure:  Patient had low Oxygen saturation in the SDU, which was readily corrected with Oxygen supplementation. Etiology is Multi-factorial, due to compressive atelectasis/mechanical obstruction secondary to abdominal distention/bilateral pleural effusions. She is s/p right thoracentesis on 05/23/12 with aspiration of 470 cc of dark bloody fluid. Fluid sent for culture and cytology. This showed numerous RBCs, and no growth. Now saturating at 92%-99% on room air.  7. Hypokalemia:  This was secondary to GI loss via NG suction. Repleted as indicated. Resolved.  8. Hypernatremia: Due to dehydration and volume depletion. Resolved with addition of free water  9. DM-uncontrolled. Managed with SSI, during her hospitalization. She did have an episode of hypoglycemia, probably secondary to inadequate caloric intake. CBGs are now reasonable. HBA1C is 6.0.  10. CHF (systolic EF 45% and diastolic dysfunction-grade 1)  No clinical evidence of decompensation.  11. Hypothyroidism: On Synthroid re[placement therapy.  12. HTN: Sub-optimally controlled, initially. Adjusted antihypertensives as indicated, with satisfactory control.  13: Anemia: HB was 7.7 on 05/28/12. Multifactorial, due to chronic disease, acute blood loss and critical illness. Hemoglobin is improved at 9.3 as of 05/29/12.  14: Right wrist pain: Patient has had right wrist pain since 05/31/12, worse on 06/01/12. No history of  trauma, ROM was restricted and she had increased local temperature, but no redness. She does have a known history of gout, so this was probably an acute flare. Managed with wrist support, analgesics and steroids. As of 06/02/12, she was asymptomatic. X-ray demonstrated no acute findings.       Procedures:  See below.   Consultations: Dr Manus Rudd, surgeon.  Dr Zoila Shutter, cardiologist.  Dr Casimiro Needle, nephrologist.  Dr Charlott Rakes, gastroenterologist.    Discharge Exam: Filed Vitals:   06/01/12 1343 06/01/12 1931 06/02/12 0422 06/02/12 1030  BP: 134/74 146/73 148/75 143/91  Pulse: 80 78 85 75  Temp: 99.1 F (37.3 C) 98.7 F (37.1 C) 97.9 F (36.6 C)   TempSrc: Oral Oral Oral   Resp: 16 18 18    Height:      Weight:      SpO2: 93% 94% 97%     General: Comfortablealert, communicative, fully oriented, not short of breath at rest.  HEENT: Moderate clinical pallor, no jaundice, no conjunctival injection or discharge. Hydration is satisfactory.  NECK: Supple, JVP not seen, no carotid bruits, no palpable lymphadenopathy, no palpable goiter.  CHEST: Clinically clear to auscultation, no wheezes, no crackles.  HEART: Sounds 1 and 2 heard, normal, irregular, no murmurs.  ABDOMEN: Full, soft, non-tender. Bowel sounds are heard.  GENITALIA: Not examined.  LOWER EXTREMITIES: Minimal edema, palpable peripheral pulses.  MUSCULOSKELETAL SYSTEM: No tenderness or swelling right wrist. ROM much improved, otherwise, generalized osteoarthritic changes.  CENTRAL NERVOUS SYSTEM: No focal neurologic deficit on gross examination.  Discharge Instructions      Discharge Orders    Future Appointments: Provider: Department: Dept Phone: Center:   08/14/2012 10:30 AM [facility] MEDICAL ONCOLOGY [phone] None   08/14/2012 11:00 AM Samul Dada, MD  [facility] MEDICAL ONCOLOGY [phone] None     Future Orders Please Complete By  Expires   For home use only DME Walker rolling      Diet - low sodium heart healthy      Diet Carb Modified      Home Health      Questions: Responses:   To provide the following care/treatments PT    RN   Face-to-face encounter      Comments:   I Saahil Herbster,CHRISTOPHER certify that this patient is under my care and that I, or a nurse practitioner or physician's assistant working with me, had a face-to-face encounter that meets the physician face-to-face encounter requirements with this patient on 06/02/2012.   Questions: Responses:   The encounter with the patient was in whole, or in part, for the following medical condition, which is the primary reason for home health care Cholelithuiasis, biliary surgery, ileus, DM   I certify that, based on my findings, the following services are medically necessary home health services Nursing    Physical therapy   My clinical findings support the need for the above services Complex treatment plan/patient with lack knowledge disease process and treatment   Further, I certify that my clinical findings support that this patient is homebound due to: Shortness of Breath with activity   To provide the following care/treatments PT    RN   Increase activity slowly          Medication List     As of 06/02/2012 12:25 PM    STOP taking these medications         amLODipine 10 MG tablet   Commonly known as: NORVASC      TAKE these medications         allopurinol 100 MG tablet   Commonly known as: ZYLOPRIM   Take 100 mg by mouth as needed. For gout      atorvastatin 40 MG tablet   Commonly known as: LIPITOR   Take 40 mg by mouth daily.      diltiazem 240 MG 24 hr capsule   Commonly known as: CARDIZEM CD   Take 1 capsule (240 mg total) by mouth daily.      feeding supplement Pudg   Take 1 Container by mouth 3 (three) times daily with meals.      furosemide 40 MG tablet   Commonly known as: LASIX   Take 40 mg by mouth daily.      glipiZIDE 10 MG  tablet   Commonly known as: GLUCOTROL   Take 10 mg by mouth daily.      levothyroxine 50 MCG tablet   Commonly known as: SYNTHROID, LEVOTHROID   Take 50 mcg by mouth daily.      magnesium oxide 400 MG tablet   Commonly known as: MAG-OX   Take 400 mg by mouth daily.      metFORMIN 500 MG tablet   Commonly known as: GLUCOPHAGE   Take 500 mg by mouth 2 (two) times daily with a meal.      metoprolol 50 MG tablet   Commonly known as: LOPRESSOR   Take 1 tablet (50 mg total) by mouth 2 (two) times daily.      potassium chloride 10 MEQ tablet   Commonly known as: K-DUR   Take 10 mEq by mouth daily.      predniSONE 20 MG tablet   Commonly known as: DELTASONE   Take 2 tablets (40 mg total)  by mouth daily with breakfast.      psyllium 95 % Pack   Commonly known as: HYDROCIL/METAMUCIL   Take 1 packet by mouth 2 (two) times daily.      saccharomyces boulardii 250 MG capsule   Commonly known as: FLORASTOR   Take 1 capsule (250 mg total) by mouth 2 (two) times daily.      traMADol 50 MG tablet   Commonly known as: ULTRAM   Take 0.5 tablets (25 mg total) by mouth every 8 (eight) hours as needed for pain.         Follow-up Information    Follow up with HAYES,JOHN C, MD. Schedule an appointment as soon as possible for a visit in 3 weeks. (for stent removal)    Contact information:   [address] [place] [address] [phone]       Follow up with Currie Paris, MD. Schedule an appointment as soon as possible for a visit in 2 weeks. (to follow up on gallbladder surgery)    Contact information:   [address] [place] [address] [phone]       Schedule an appointment as soon as possible for a visit with [name], [name], DO.   Contact information:   [address] [place] [address] [phone]           The results of significant diagnostics from this hospitalization (including  imaging, microbiology, ancillary and laboratory) are listed below for reference.    Significant Diagnostic Studies: Ct Abdomen Pelvis Wo Contrast  05/16/2012  *RADIOLOGY REPORT*  Clinical Data: Abdominal pain and distention.  Status post cholecystectomy 05/12/2012.  CT ABDOMEN AND PELVIS WITHOUT CONTRAST  Technique:  Multidetector CT imaging of the abdomen and pelvis was performed following the standard protocol without intravenous contrast.  Comparison: CT chest and abdomen 11/08/2006 and CT abdomen and pelvis 02/12/2006.  Findings: The patient has small bilateral pleural effusions, larger on the right.  There is associated basilar atelectasis, also worse on the right.  Heart size is enlarged.  No pericardial effusion.  NG tube is in place.  The patient has a small bowel obstruction with dilatation of small bowel loops up to 4.4 cm and extensive stool within small bowel.  Distal small bowel loops are completely decompressed.  Transition point is not identified. No pneumatosis or portal venous gas is present.  Small amount of free intraperitoneal air is seen about the liver and likely due to postoperative change.  There is a small amount of perihepatic and free pelvic fluid with increased attenuation consistent with the presence of hemorrhage. Fluid in the pelvis is Hounsfield unit measurements of 36.5.  No focal liver lesions is seen on uninfused examination.  The patient appears to be status post splenectomy with splenosis noted in the left upper quadrant.  The adrenal glands, kidneys and pancreas appear normal.  Atherosclerotic vascular disease without aneurysm of the aorta is identified.  There is no lymphadenopathy. No focal bony abnormality.  IMPRESSION:  1.  Findings consistent with small bowel obstruction likely due to adhesions.  No mass or evidence of bowel ischemia is present on this uninfused study. 2.  Small to moderate volume of perihepatic and pelvic hemorrhage. Some of the fluid about the liver  could be bowel although this is felt less likely. 3.  Status post splenectomy. 4.  Small bilateral pleural effusions.   Original Report Authenticated By: Bernadene Bell. Maricela Curet, M.D.  Ct Abdomen Wo Contrast  05/22/2012    *RADIOLOGY REPORT*  Clinical Data:  Abdominal pain and leukocytosis. Bile leak.  CT ABDOMEN WITHOUT CONTRAST  Technique:  Multidetector CT imaging of the abdomen was performed following the standard protocol without IV contrast.  Comparison:  CT scan dated 05/16/2012  Findings:  There is persistent dilatation of proximal small bowel consistent with a partial small bowel obstruction.  The colon is not dilated.  Biliary stent has been placed.  There is some reflux of the oral contrast up the stent into the common bile duct.  There are focal areas of loculated hemorrhage along the inferior margin of the right and left lobes of the liver, not significantly changed since the prior study.  The hemorrhage and ascites around the right lobe of the liver laterally and inferiorly have almost resolved. No bile duct dilatation.  The bilateral pleural effusions have increased, right greater than left, with increased compressive atelectasis of the lower lobes.  No other change since the prior exam.  IMPRESSION:  1.  Interval insertion of biliary stent.  Almost complete resolution of the fluid adjacent to the right lobe of the liver. 2.  Stable perihepatic hemorrhage adjacent to the inferior aspect of the left and right lobes of the liver. 3.  Increasing bilateral pleural effusions with increased atelectasis at both lung bases. 4.  Persistent partial small bowel obstruction.   Original Report Authenticated By: Francene Boyers, M.D.    Dg Chest 1 View  05/23/2012  *RADIOLOGY REPORT*  Clinical Data: 78 year old female status post thoracentesis.  CHEST - 1 VIEW  Comparison: 0749 hours the same day and earlier.  Findings: AP portable semi upright view 1234 hours.  No pneumothorax.  Mildly improved ventilation at  both lung bases. Left pleural effusion stable or mildly decreased. Small/trace right effusion.  Stable cardiac size and mediastinal contours.  Right PICC line and enteric tube are stable.  IMPRESSION: No pneumothorax and mildly improved bibasilar ventilation following thoracentesis.   Original Report Authenticated By: Erskine Speed, M.D.    Dg Cholangiogram Operative  05/14/2012  *RADIOLOGY REPORT*  Clinical Data: Laparoscopic cholecystectomy  INTRAOPERATIVE CHOLANGIOGRAM  Comparison:  Abdominal ultrasound - 05/12/2012  Findings:  Intraoperative angiographic images of the right upper abdominal quadrant during laparoscopic cholecystectomy are provided for review.  Surgical clips overlie the expected location of the gallbladder fossa.  Contrast injection demonstrates selective cannulation of the central aspect of the cystic duct.  There is brisk passage of contrast through the central aspect of the cystic duct with filling of a mildly dilated common bile duct. There is brisk passage of contrast though the CBD and into the descending portion of the duodenum.  There is minimal reflux of injected contrast into the common hepatic duct and central aspect of the nondilated intrahepatic biliary system.  There are no discrete filling defects within the opacified portions of the biliary system to suggest the presence of choledocholithiasis.  IMPRESSION:  Intraoperative cholangiogram as above.  No discrete filling defects to suggest the presence of choledocholithiasis.   Original Report Authenticated By: Waynard Reeds, M.D.    Nm Hepatobiliary  05/16/2012  *RADIOLOGY REPORT*  Clinical Data:  Postop microscopic cholecystectomy.  Question bile leak.  NUCLEAR MEDICINE HEPATOBILIARY IMAGING  Technique:  Sequential images of the abdomen were obtained out to 60 minutes following intravenous administration of radiopharmaceutical.  Radiopharmaceutical:  5.88mCi Tc-24m Choletec  Comparison:  CT 05/16/2012  Findings: There is  rapid clearance of radiotracer from the  pool and homogeneous uptake within the liver.  Initially,  bile flows into the small bowel through the common bile duct.  During the lateral part of the first hour of imaging and into the second hour of imaging, radiotracer is noted along the inferior margin of the right hepatic lobe.  Subsequently this radiotracer descends along the right pericolic gutter and begins to outline the lateral margin of the liver.  These findings are consistent with a bile leak.  IMPRESSION:  1.  Bile leak centered along the inferior right hepatic margin and spilling into aathe right pericolic gutter.  2.  Bile  does flow through the common bile duct  into the small bowel.  Findings conveyed to Dr. Jamey Ripa on 05/16/2012 at this 1650 hours   Original Report Authenticated By: Genevive Bi, M.D.    US Abdomen Complete  05/12/2012  *RADIOLOGY REPORT*  Clinical Data:  Upper abdominal pain.  Concern for gallstones. History of splenectomy.  COMPLETE ABDOMINAL ULTRASOUND  Comparison:  CT 11/08/2006  Findings:  Gallbladder:  There is a 0.9 cm stone near the gallbladder neck. There is no evidence for gallbladder wall thickening.  The patient does not have a sonographic Murphy's sign.  Common bile duct:  Measures 0.5 cm.  Liver:  The liver is echogenic and slightly heterogeneous.  IVC:  Appears normal.  Pancreas:  Poorly visualized due to bowel gas.  Spleen:  The patient had a splenectomy but there is a round structure in the left upper quadrant consistent with a splenule. Splenule measures up to 2.4 cm and this was present on the prior examination.  Right Kidney:  Right kidney measures 10.1 cm in length without hydronephrosis.  Left Kidney:  Left kidney is difficult to evaluate and roughly measures 9.6 cm in length without hydronephrosis.  Abdominal aorta: Retroperitoneal structures are difficult to evaluate.  Cannot exclude aneurysmal dilatation of the mid abdominal aorta.  IMPRESSION: There is a 0.9  cm gallstone near the gallbladder neck.  No evidence for biliary dilatation.  Limited evaluation of the abdominal aorta.  There is concern for aneurysmal dilatation in the mid abdominal aorta.  This could be better evaluated with CT.   Original Report Authenticated By: Richarda Overlie, M.D.    US Renal  05/15/2012  *RADIOLOGY REPORT*  Clinical Data: Acute renal failure  RENAL/URINARY TRACT ULTRASOUND COMPLETE  Comparison:  Abdominal ultrasound 05/12/2012, 11/08/2006 CT  Findings:  Right Kidney:  10.5 cm. No hydronephrosis or focal mass.  Left Kidney:  10.4 cm. No hydronephrosis or focal mass.  Bladder:  Normal in appearance.  Ascites is noted.  IMPRESSION: Normal exam.  No hydronephrosis or solid renal mass.  Ascites.   Original Report Authenticated By: Harrel Lemon, M.D.    Dg Chest Port 1 View  05/27/2012  *RADIOLOGY REPORT*  Clinical Data: Right pleural effusion.  PORTABLE CHEST - 1 VIEW  Comparison: Chest x-ray 05/23/2012.  Findings: There is a right upper extremity PICC with tip terminating in the mid superior vena cava.  Nasogastric tube has been removed.  Lung volumes remain low.  There are worsening bibasilar opacities that may reflect areas of atelectasis and/or consolidation.  Small bilateral pleural effusions (right greater than left), increasing on the right.  Pulmonary vascular crowding, without frank pulmonary edema.  Heart size is mildly enlarged (unchanged). The patient is rotated to the left on today's exam, resulting in distortion of the mediastinal contours and reduced diagnostic sensitivity and specificity for mediastinal pathology. Atherosclerosis in the thoracic aorta.  IMPRESSION: 1.  Support apparatus, as above. 2.  Worsening bibasilar aeration compatible with increasing bibasilar atelectasis and/or consolidation and increasing bilateral pleural effusions (particularly on the right). 3.  Atherosclerosis.   Original Report Authenticated By: Trudie [name], M.D.    Dg Chest Port 1  View  05/23/2012  *RADIOLOGY REPORT*  Clinical Data: Leukocytosis, hypoxia  PORTABLE CHEST - 1 VIEW  Comparison: 05/19/2012  Findings: Cardiomegaly with pulmonary vascular congestion and suspected mild central pulmonary edema in the right perihilar region.  Bibasilar opacities, possibly atelectasis.  No pneumothorax.  Stable enteric tube and right arm PICC.  IMPRESSION: Cardiomegaly with pulmonary vascular congestion and suspected mild central pulmonary edema.  Bibasilar opacities, possibly atelectasis.   Original Report Authenticated By: Charline Bills, M.D.    Dg Chest Port 1 View  05/19/2012  *RADIOLOGY REPORT*  Clinical Data: PICC line placement.  PORTABLE CHEST - 1 VIEW  Comparison: One-view chest 05/18/2012.  Findings: Mild cardiac enlargement is stable.  The sideport of an NG tube is in the stomach.  A new right-sided PICC line is in place.  The tip is in the distal SVC, above the cavoatrial junction.  The lung volumes remain low.  Mild pulmonary vascular congestion is stable.  IMPRESSION:  1.  Stable position of the right-sided PICC line in the lower SVC. The 2.  Persistent low lung volumes. 3.  Mild cardiomegaly and pulmonary vascular congestion.   Original Report Authenticated By: Jamesetta Orleans. MATTERN, M.D.    Dg Chest Port 1 View  05/18/2012  *RADIOLOGY REPORT*  Clinical Data: Central line placement  PORTABLE CHEST - 1 VIEW  Comparison: 05/16/2012  Findings: Right arm PICC line extends to the low SVC.  Heart size upper limits normal for technique.  Low volumes with perihilar and bibasilar subsegmental atelectasis or infiltrate, left greater than right.  No effusion.  Nasogastric tube extends at least as far as the stomach, tip not seen.  IMPRESSION:  1.  PICC line to low SVC.   Original Report Authenticated By: Osa Craver, M.D.    Dg Chest Port 1 View  05/16/2012  *RADIOLOGY REPORT*  Clinical Data: Shortness of breath.  Status post cholecystectomy.  PORTABLE CHEST - 1 VIEW   Comparison: Chest and two views abdomen 05/15/2012 and plain film of the chest 12/02/2009.  Findings: Again seen is cardiomegaly.  Lung volumes are low with basilar atelectasis.  Calcified granuloma left midlung again identified.  No pneumothorax.  IMPRESSION:  1.  Bibasilar atelectasis in a low-volume chest. 2.  Cardiomegaly without edema.   Original Report Authenticated By: Bernadene Bell. D'ALESSIO, M.D.    Dg Chest Port 1 View  05/15/2012  *RADIOLOGY REPORT*  Clinical Data: Hypoxia, CHF, lower abdominal and right shoulder pain, 2 days post cholecystectomy, history hypertension, CHF, atrial fibrillation, diabetes  PORTABLE CHEST - 1 VIEW  Comparison: Portable exam 1100 hours compared to 12/02/2009  Findings: Enlargement of cardiac silhouette with pulmonary vascular congestion. Atherosclerotic calcification aorta. Minimal bibasilar atelectasis. Lungs otherwise clear. Bones demineralized. No pleural effusion or pneumothorax.  IMPRESSION: Enlargement of cardiac silhouette with pulmonary vascular congestion. Minimal bibasilar atelectasis.   Original Report Authenticated By: Lollie Marrow, M.D.    Dg Ercp With Sphincterotomy  05/17/2012  *RADIOLOGY REPORT*  Clinical Data: Common bile duct leak and  ERCP with sphincterotomy  Comparison:  CT 05/16/2012, and hiatus scan 05/16/2012  Technique:  Multiple spot images obtained with the fluoroscopic device and submitted for interpretation post-procedure.  ERCP was performed by Dr.  an.  Findings: Two fluoroscopic images are provided.  There is an endoscope within duodenum.  The common bile duct is cannulated following a small sphincterotomy.  Injection of contrast demonstrates no filling defect.  No evidence of leak from the cystic duct.  A stent was placed by report  IMPRESSION: No evidence of biliary leak.  ERCP with sphincterotomy  and stent placement.  These images were submitted for radiologic interpretation only. Please see the procedural report for the amount of  contrast and the fluoroscopy time utilized.   Original Report Authenticated By: Genevive Bi, M.D.    Dg Abd 2 Views  05/18/2012  *RADIOLOGY REPORT*  Clinical Data: Small bowel obstruction, abdominal pain  ABDOMEN - 2 VIEW  Comparison: CT 05/16/2012  Findings: Nasogastric tube extends into the decompressed stomach. Vascular clips in the right upper abdomen.  No free air on the left lateral decubitus radiograph.  There are few gas dilated small bowel loops in mid abdomen, with fluid levels on the decubitus film.  Normal distribution of gas and stool throughout the nondilated colon.  Plastic endoscopic biliary stent projects in expected location.  Regional bones unremarkable.  IMPRESSION:  1.  Fluid levels in dilated small bowel loops suggesting persistent mid small bowel obstruction. 2.  No free air. 3. Support hardware stable in position.   Original Report Authenticated By: Osa Craver, M.D.    Dg Abd Acute W/chest  05/15/2012  *RADIOLOGY REPORT*  Clinical Data: Tympanitic abdomen.  Postoperative abdomen. Hypoactive bowel sounds.  Abdominal pain.  ACUTE ABDOMEN SERIES (ABDOMEN 2 VIEW & CHEST 1 VIEW)  Comparison: 05/15/2012 chest radiograph.  Findings: Low lung volumes are present with basilar atelectasis. Cardiopericardial silhouette is unchanged, likely within normal limits allowing for volumes of inspiration.  Cholecystectomy clips are present in the right upper quadrant.  No free air is identified in the abdomen.  Small and large bowel dilation is present with scattered air fluid levels most compatible with ileus in the postoperative setting.  Paucity of rectal gas. Gas and stool is present along the descending colon.  Bilateral right greater than left hip osteoarthritis is incidentally noted. Right sacroiliac joint degenerative disease.  IMPRESSION:  1.  Low volume chest. 2.  Cholecystectomy. 3.  Bowel gas pattern compatible with ileus.   Original Report Authenticated By: Andreas Newport,  M.D.    Dg Abd Portable 1v  05/23/2012  *RADIOLOGY REPORT*  Clinical Data: Partial small bowel obstruction.  PORTABLE ABDOMEN - 1 VIEW  Comparison: CT 05/22/2012 and abdominal radiograph 05/21/2012  Findings: Supine images again demonstrate dilated loops of bowel along the left mid abdomen.  There is gas and high dense material within the colon.  Surgical clips in the right upper quadrant. Evidence for a non metallic biliary stent.  Small amount of gas in the rectum. A nasogastric tube is present.  There appears to be consolidation at the left lung base.  IMPRESSION: Persistent dilated loops of bowel in the left abdomen.  This finding has minimally changed since the previous examination. Findings could be associated with a partial small bowel obstruction.   Original Report Authenticated By: Richarda Overlie, M.D.    Dg Abd Portable 1v  05/21/2012  *RADIOLOGY REPORT*  Clinical Data: Ileus versus small bowel obstruction, follow-up  PORTABLE ABDOMEN - 1 VIEW  Comparison: Abdomen films of 05/19/2012  Findings: There is still a significantly dilated loop of small bowel in the left mid abdomen suspicious for persistent small bowel obstruction.  Some colonic bowel gas  is seen but the colon is not distended.  An NG tube remains.  IMPRESSION: Persistently dilated small bowel loop in the left midabdomen consistent with partial small bowel obstruction.   Original Report Authenticated By: Juline Patch, M.D.    Dg Abd Portable 1v  05/16/2012  *RADIOLOGY REPORT*  Clinical Data: Abdominal pain and distension, recent surgery.  PORTABLE ABDOMEN - 1 VIEW  Comparison: 05/15/2012  Findings: Hemidiaphragms/upper abdomen excluded from the images. Distended loops of large and small bowel again noted.  Surgical clips right upper quadrant.  The organ outlines normal where seen. No acute osseous finding.  IMPRESSION: Gaseous distension of loops of large and small bowel, similar to prior.   Original Report Authenticated By: Waneta Martins, M.D.    Dg Abd Portable 2v  05/19/2012  *RADIOLOGY REPORT*  Clinical Data: Ileus versus small bowel obstruction.  PORTABLE ABDOMEN - 2 VIEW  Comparison: 05/18/2012.  Findings: There are dilated loops of small bowel in the central abdomen, measuring up to 4.7 cm in diameter.  Nasogastric terminates in the stomach.  Minimal colonic gas.  There is gas in the rectum.  IMPRESSION: Bowel gas pattern appears relatively unchanged from 05/18/2012, favoring a small bowel obstruction.   Original Report Authenticated By: Reyes Ivan, M.D.    US Thoracentesis Asp Pleural Space W/img Guide  05/23/2012  *RADIOLOGY REPORT*  Clinical Data:  Recent abdominal surgery, shortness of breath, bilateral pleural effusions  ULTRASOUND GUIDED right THORACENTESIS  Comparison:  None  An ultrasound guided thoracentesis was thoroughly discussed with the patient and questions answered.  The benefits, risks, alternatives and complications were also discussed.  The patient understands and wishes to proceed with the procedure.  Written consent was obtained.  Ultrasound was performed to each side of the chest, small amount of fluid noted on left. Moderate amount of dense appearing fluid on right. Right side chosen to localize and mark an adequate pocket of fluid in the right chest.  The area was then prepped and draped in the normal sterile fashion.  1% Lidocaine was used for local anesthesia.  Under ultrasound guidance a 19 gauge Yueh catheter was introduced.  Thoracentesis was performed.  The catheter was removed and a dressing applied.  Complications:  None immediate  Findings: A total of approximately 470 ml of dark bloody fluid was removed. A fluid sample was sent for laboratory analysis.  IMPRESSION: Successful ultrasound guided right thoracentesis yielding 470 ml of bloody pleural fluid.  Read by Brayton El PA-C   Original Report Authenticated By: Judie Petit. Miles Costain, M.D.     Microbiology: Recent Results (from the past 240  hour(s))  URINE CULTURE     Status: Normal   Collection Time   05/23/12  1:27 PM      Component Value Range Status Comment   Specimen Description URINE, CATHETERIZED   Final    Special Requests NONE   Final    Culture  Setup Time 05/23/2012 14:10   Final    Colony Count NO GROWTH   Final    Culture NO GROWTH   Final    Report Status 05/24/2012 FINAL   Final   CLOSTRIDIUM DIFFICILE BY PCR     Status: Normal   Collection Time   05/30/12 11:30 AM      Component Value Range Status Comment   C difficile by pcr NEGATIVE  NEGATIVE Final      Labs: Basic Metabolic Panel:  Lab 06/02/12 1610 05/31/12 0500 05/30/12 0550 05/29/12 0410  05/28/12 0449 05/27/12 0500  NA 139 142 143 141 139 --  K 3.9 3.7 3.7 3.9 4.2 --  CL 101 104 106 103 103 --  CO2 30 33* 32 30 29 --  GLUCOSE 256* 145* 124* 111* 138* --  BUN 11 8 11 13 15  --  CREATININE 0.93 0.87 1.02 0.93 0.91 --  CALCIUM 9.1 8.7 8.3* 8.4 8.2* --  MG -- -- -- -- -- 1.7  PHOS -- -- -- -- -- 3.0   Liver Function Tests:  Lab 05/29/12 0410 05/28/12 0449  AST 25 29  ALT 20 20  ALKPHOS 113 115  BILITOT 0.4 0.4  PROT 6.4 6.2  ALBUMIN 2.1* 2.1*   No results found for this basename: LIPASE:5,AMYLASE:5 in the last 168 hours No results found for this basename: AMMONIA:5 in the last 168 hours CBC:  Lab 06/02/12 0500 05/31/12 0500 05/30/12 0550 05/29/12 0410 05/28/12 0449  WBC 9.4 9.4 11.1* 10.4 14.2*  NEUTROABS -- -- -- 8.3* --  HGB 8.7* 8.3* 8.1* 9.3* 7.7*  HCT 25.2* 24.3* 23.8* 27.0* 22.4*  MCV 79.7 81.0 81.8 82.3 82.4  PLT 599* 596* 580* 528* 541*   Cardiac Enzymes: No results found for this basename: CKTOTAL:5,CKMB:5,CKMBINDEX:5,TROPONINI:5 in the last 168 hours BNP: BNP (last 3 results)  Basename 05/15/12 1114  PROBNP 2820.0*   CBG:  Lab 06/02/12 1143 06/02/12 0557 06/01/12 2100 06/01/12 1622 06/01/12 1133  GLUCAP 316* 210* 212* 165* 146*       Signed:  Fabian Coca,CHRISTOPHER  [facility] 06/02/2012, 12:25  PM

## 2012-06-02 DIAGNOSIS — M109 Gout, unspecified: Secondary | ICD-10-CM

## 2012-06-02 LAB — CBC
MCH: 27.5 pg (ref 26.0–34.0)
MCHC: 34.5 g/dL (ref 30.0–36.0)
MCV: 79.7 fL (ref 78.0–100.0)
Platelets: 599 10*3/uL — ABNORMAL HIGH (ref 150–400)
RDW: 16.2 % — ABNORMAL HIGH (ref 11.5–15.5)
WBC: 9.4 10*3/uL (ref 4.0–10.5)

## 2012-06-02 LAB — BASIC METABOLIC PANEL
BUN: 11 mg/dL (ref 6–23)
Calcium: 9.1 mg/dL (ref 8.4–10.5)
Creatinine, Ser: 0.93 mg/dL (ref 0.50–1.10)
GFR calc Af Amer: 66 mL/min — ABNORMAL LOW (ref >90)
GFR calc non Af Amer: 57 mL/min — ABNORMAL LOW (ref >90)

## 2012-06-02 MED ORDER — PSYLLIUM 95 % PO PACK: 1.0000 | PACK | Freq: Two times a day (BID) | ORAL | Status: DC

## 2012-06-02 MED ORDER — TRAMADOL HCL 50 MG PO TABS: 25.0000 mg | ORAL_TABLET | Freq: Three times a day (TID) | ORAL | Status: DC | PRN

## 2012-06-02 MED ORDER — PREDNISONE 20 MG PO TABS: 40.0000 mg | ORAL_TABLET | Freq: Every day | ORAL | Status: DC

## 2012-06-02 MED ORDER — ENSURE PUDDING PO PUDG: 1.0000 | Freq: Three times a day (TID) | ORAL | Status: DC

## 2012-06-02 MED ORDER — DILTIAZEM HCL ER COATED BEADS 240 MG PO CP24: 240.0000 mg | ORAL_CAPSULE | Freq: Every day | ORAL | Status: DC

## 2012-06-02 MED ORDER — SACCHAROMYCES BOULARDII 250 MG PO CAPS: 250.0000 mg | ORAL_CAPSULE | Freq: Two times a day (BID) | ORAL | Status: DC

## 2012-06-02 MED ORDER — METOPROLOL TARTRATE 50 MG PO TABS: 50.0000 mg | ORAL_TABLET | Freq: Two times a day (BID) | ORAL | Status: DC

## 2012-06-02 NOTE — Progress Notes (Signed)
Reviewed discharge instructions with patient and family, answered questions, verbalized understanding and answered questions appropriately, dc home with instructions and belongings. Nicole [name] A

## 2012-06-02 NOTE — Progress Notes (Signed)
Physical Therapy Treatment Patient Details Name: Nicole [name] MRN: [id number] DOB: 05/15/1931 Today's Date: 06/02/2012 Time: 4696-2952 PT Time Calculation (min): 24 min  PT Assessment / Plan / Recommendation Comments on Treatment Session  Pt able to maintain SaO2 at 90-91% on RA during gait/stair trials    Follow Up Recommendations  Home health PT;Supervision for mobility/OOB     Does the patient have the potential to tolerate intense rehabilitation     Barriers to Discharge        Equipment Recommendations  None recommended by PT    Recommendations for Other Services    Frequency Min 3X/week   Plan Discharge plan remains appropriate;Frequency remains appropriate    Precautions / Restrictions Precautions Precautions: None   Pertinent Vitals/Pain EHR 119, 116, 98 ,  SaO2 89-91%, 90-91% and 89-91%, respectively, on RA exclusively, taken in standing during rests     Mobility  Bed Mobility Bed Mobility: Not assessed Transfers Transfers: Stand to Sit;Sit to Stand Sit to Stand: 5: Supervision;With upper extremity assist;From chair/3-in-1 Stand to Sit: 5: Supervision;With upper extremity assist;To chair/3-in-1 Details for Transfer Assistance: moves safely without assist Ambulation/Gait Ambulation/Gait Assistance: 5: Supervision Ambulation Distance (Feet): 225 Feet (total with 3 rest stops to check stats) Assistive device: None;Other (Comment) (vs IV pole) Ambulation/Gait Assistance Details: steady gait with/without A device, but mildly more guarded without assist Gait Pattern: Within Functional Limits;Decreased stride length Gait velocity: slowed Stairs: Yes Stairs Assistance: 4: Min guard Stair Management Technique: One rail Left;Alternating pattern;Forwards Number of Stairs: 4  Wheelchair Mobility Wheelchair Mobility: No    Exercises     PT Diagnosis:    PT Problem List:   PT Treatment Interventions:     PT Goals Acute Rehab PT Goals Time For Goal  Achievement: 06/05/12 Potential to Achieve Goals: Good PT Goal: Sit to Stand - Progress: Progressing toward goal PT Goal: Stand to Sit - Progress: Progressing toward goal PT Goal: Ambulate - Progress: Progressing toward goal PT Goal: Up/Down Stairs - Progress: Progressing toward goal  Visit Information  Last PT Received On: 06/02/12 Assistance Needed: +1    Subjective Data  Subjective: Look, these fingers are moving good.   Cognition  Overall Cognitive Status: Appears within functional limits for tasks assessed/performed Arousal/Alertness: Awake/alert Orientation Level: Appears intact for tasks assessed Behavior During Session: [facility] for tasks performed Cognition - Other Comments: very pleasant    Balance  Balance Balance Assessed: Yes Static Standing Balance Static Standing - Balance Support: During functional activity;No upper extremity supported;Right upper extremity supported Static Standing - Level of Assistance: 5: Stand by assistance  End of Session PT - End of Session Activity Tolerance: Patient tolerated treatment well;Patient limited by fatigue Patient left: in chair;with call bell/phone within reach;with family/visitor present Nurse Communication: Mobility status   GP     Jayant Kriz, Eliseo Gum 06/02/2012, 1:05 PM 06/02/2012  Bruin Bing, PT [phone] (pager)

## 2012-06-10 ENCOUNTER — Encounter (INDEPENDENT_AMBULATORY_CARE_PROVIDER_SITE_OTHER): Payer: Self-pay | Admitting: Surgery

## 2012-06-10 ENCOUNTER — Ambulatory Visit (INDEPENDENT_AMBULATORY_CARE_PROVIDER_SITE_OTHER): Payer: Medicare Other | Admitting: Surgery

## 2012-06-10 VITALS — BP 146/70 | HR 76 | Temp 97.4°F | Resp 20 | Ht 70.0 in | Wt 169.4 lb

## 2012-06-10 DIAGNOSIS — Z09 Encounter for follow-up examination after completed treatment for conditions other than malignant neoplasm: Secondary | ICD-10-CM

## 2012-06-10 NOTE — Patient Instructions (Signed)
See me again in 3 or 4 weeks

## 2012-06-10 NOTE — Progress Notes (Signed)
[name]       DOB: 1930-09-28           DATE: 06/10/2012       WUJ:[id number]   CC: Postop laparoscopic cholecystectomy  Impression:  The patient appears to be doing well, with improvement in her symptoms Plan:  She may resume full activity and regular diet. She come back to see me in about 3 weeks sooner if she has any problems.   HPI:  This patient underwent a laparoscopic cholecystectomy with operative cholangiogram on 05/12/2012. She is in for her first postoperative visit. Unfortunately had some postoperative complications. She developed a significant hematoma in her abdominal wall. She also developed a bile leak necessitating an ERCP and stent placement. Shehad a  prolonged ileus and at one point looked like she might have a small bowel obstruction but that improved. She's been home for about a week.She's eating okay. She hasn't had a recurrence of her preoperative symptoms. Her bowels are working.no nausea or vomiting. She does note that food has no taste or flavor.   She saw her primary care physician yesterday and so labs were drawn.  PE:  VS: BP 146/70  Pulse 76  Temp 97.4 F (36.3 C) (Temporal)  Resp 20  Ht 5\' 10"  (1.778 m)  Wt 169 lb 6.4 oz (76.839 kg)  BMI 24.31 kg/m2  General: The patient is alert and appears comfortable, NAD.  Abdomen: Soft and benign. The incisions are healing nicely. There is a fairly large ecchymosis around the umbilical incision.  Data reviewed: IOC: The IOC  Pathology: Diagnosis Gallbladder - CHRONIC CHOLECYSTITIS AND CHOLELITHIASIS. - THERE IS NO EVIDENCE OF MALIGNANCY. Pecola Leisure MD

## 2012-07-01 ENCOUNTER — Encounter (INDEPENDENT_AMBULATORY_CARE_PROVIDER_SITE_OTHER): Payer: Self-pay | Admitting: Surgery

## 2012-07-01 ENCOUNTER — Ambulatory Visit (INDEPENDENT_AMBULATORY_CARE_PROVIDER_SITE_OTHER): Payer: Medicare Other | Admitting: Surgery

## 2012-07-01 VITALS — BP 142/78 | HR 66 | Temp 97.7°F | Resp 18 | Ht 70.0 in | Wt 163.0 lb

## 2012-07-01 DIAGNOSIS — Z09 Encounter for follow-up examination after completed treatment for conditions other than malignant neoplasm: Secondary | ICD-10-CM

## 2012-07-01 NOTE — Patient Instructions (Signed)
We will see you again on an as needed basis. Please call the office at [phone] if you have any questions or concerns. Thank you for allowing us to take care of you.  

## 2012-07-01 NOTE — Progress Notes (Signed)
NAMEADALYNA [name]       DOB: November 03, 1930           DATE: 07/01/2012       WUJ:[id number]   CC: Postop laparoscopic cholecystectomy  Impression:  The patient appears to be doing well, with improvement in her symptoms Plan:  She may resume full activity and regular diet. She will come back prn  HPI:  This patient underwent a laparoscopic cholecystectomy with operative cholangiogram on 05/12/2012. She is in for her first postoperative visit. She is feeling much better, still a little abd pain from the abd wall hematoma. To have stint removed in January .  PE:  VS: BP 142/78  Pulse 66  Temp 97.7 F (36.5 C) (Oral)  Resp 18  Ht 5\' 10"  (1.778 m)  Wt 163 lb (73.936 kg)  BMI 23.39 kg/m2  General: The patient is alert and appears comfortable, NAD.  Abdomen: Soft and benign. The incisions are healing nicely. There is a fairly large ecchymosis around the umbilical incision.  Data reviewed: No new data

## 2012-08-01 ENCOUNTER — Encounter (INDEPENDENT_AMBULATORY_CARE_PROVIDER_SITE_OTHER): Payer: Medicare Other | Admitting: Surgery

## 2012-08-12 ENCOUNTER — Encounter (HOSPITAL_COMMUNITY)
Admission: RE | Disposition: A | Discharge status: Home / Self Care | Payer: Self-pay | Source: Ambulatory Visit | Attending: Gastroenterology

## 2012-08-12 ENCOUNTER — Ambulatory Visit (HOSPITAL_COMMUNITY): Payer: Medicare Other

## 2012-08-12 ENCOUNTER — Encounter (HOSPITAL_COMMUNITY): Payer: Self-pay | Admitting: Gastroenterology

## 2012-08-12 ENCOUNTER — Ambulatory Visit (HOSPITAL_COMMUNITY)
Admission: RE | Admit: 2012-08-12 | Discharge: 2012-08-12 | Disposition: A | Discharge status: Home / Self Care | Payer: Medicare Other | Source: Ambulatory Visit | Attending: Gastroenterology | Admitting: Gastroenterology

## 2012-08-12 DIAGNOSIS — E119 Type 2 diabetes mellitus without complications: Secondary | ICD-10-CM | POA: Insufficient documentation

## 2012-08-12 DIAGNOSIS — I10 Essential (primary) hypertension: Secondary | ICD-10-CM | POA: Insufficient documentation

## 2012-08-12 DIAGNOSIS — Z09 Encounter for follow-up examination after completed treatment for conditions other than malignant neoplasm: Secondary | ICD-10-CM | POA: Insufficient documentation

## 2012-08-12 DIAGNOSIS — K9189 Other postprocedural complications and disorders of digestive system: Secondary | ICD-10-CM

## 2012-08-12 DIAGNOSIS — K838 Other specified diseases of biliary tract: Secondary | ICD-10-CM

## 2012-08-12 DIAGNOSIS — E7800 Pure hypercholesterolemia, unspecified: Secondary | ICD-10-CM | POA: Insufficient documentation

## 2012-08-12 HISTORY — PX: ERCP: SHX5425

## 2012-08-12 HISTORY — DX: Hypothyroidism, unspecified: E03.9

## 2012-08-12 HISTORY — DX: Unspecified osteoarthritis, unspecified site: M19.90

## 2012-08-12 LAB — GLUCOSE, CAPILLARY: Glucose-Capillary: 106 mg/dL — ABNORMAL HIGH (ref 70–99)

## 2012-08-12 SURGERY — ERCP, WITH INTERVENTION IF INDICATED
Anesthesia: Moderate Sedation

## 2012-08-12 MED ORDER — BUTAMBEN-TETRACAINE-BENZOCAINE 2-2-14 % EX AERO
INHALATION_SPRAY | CUTANEOUS | Status: DC | PRN
  Administered 2012-08-12: 2 via TOPICAL

## 2012-08-12 MED ORDER — FENTANYL CITRATE 0.05 MG/ML IJ SOLN
INTRAMUSCULAR | Status: DC | PRN
  Administered 2012-08-12: 15 ug via INTRAVENOUS
  Administered 2012-08-12 (×2): 25 ug via INTRAVENOUS
  Administered 2012-08-12: 15 ug via INTRAVENOUS

## 2012-08-12 MED ORDER — SODIUM CHLORIDE 0.9 % IV SOLN: INTRAVENOUS | Status: DC

## 2012-08-12 MED ORDER — MIDAZOLAM HCL 10 MG/2ML IJ SOLN
INTRAMUSCULAR | Status: DC | PRN
  Administered 2012-08-12 (×4): 2 mg via INTRAVENOUS

## 2012-08-12 MED ORDER — FENTANYL CITRATE 0.05 MG/ML IJ SOLN
INTRAMUSCULAR | Status: AC
  Filled 2012-08-12: qty 4

## 2012-08-12 MED ORDER — MIDAZOLAM HCL 5 MG/ML IJ SOLN
INTRAMUSCULAR | Status: AC
  Filled 2012-08-12: qty 4

## 2012-08-12 MED ORDER — GLUCAGON HCL (RDNA) 1 MG IJ SOLR
INTRAMUSCULAR | Status: AC
  Filled 2012-08-12: qty 2

## 2012-08-12 NOTE — H&P (Addendum)
[facility] Admission History & Physical  Chief Complaint: Removal of biliary stent HPI: Nicole [name] is an 79 y.o. black female.  Who presents for removal of biliary stent placed on October 26 after bile leak sustained after laparoscopic cholecystectomy. She has done well clinically in followup since the stent was placed.  Past Medical History  Diagnosis Date  . Colon cancer 07/1997  . Hypertension   . Diabetes mellitus   . CHF (congestive heart failure)   . AAA (abdominal aortic aneurysm)   . Atrial fibrillation   . Hypercholesteremia   . Kidney stone     renal calculi  . Small bowel obstruction due to adhesions 05/16/2012  . Hypothyroidism   . Arthritis     Past Surgical History  Procedure Date  . Hemicolectomy 08/11/1997  . Kidney stone surgery   . Colon surgery     to remove colon ca  . Cholecystectomy 05/14/2012    Procedure: LAPAROSCOPIC CHOLECYSTECTOMY WITH INTRAOPERATIVE CHOLANGIOGRAM;  Surgeon: Currie Paris, MD;  Location: [facility] OR;  Service: General;  Laterality: N/A;  . Ercp 05/17/2012    Procedure: ENDOSCOPIC RETROGRADE CHOLANGIOPANCREATOGRAPHY (ERCP);  Surgeon: Barrie Folk, MD;  Location: [facility] OR;  Service: Gastroenterology;  Laterality: N/A;    Medications Prior to Admission  Medication Sig Dispense Refill  . allopurinol (ZYLOPRIM) 100 MG tablet Take 100 mg by mouth as needed. For gout      . atorvastatin (LIPITOR) 40 MG tablet Take 40 mg by mouth daily.       Marland Kitchen diltiazem (CARDIZEM CD) 240 MG 24 hr capsule Take 1 capsule (240 mg total) by mouth daily.  30 capsule  0  . furosemide (LASIX) 40 MG tablet Take 40 mg by mouth daily.      Marland Kitchen glipiZIDE (GLUCOTROL) 10 MG tablet Take 10 mg by mouth daily.        Marland Kitchen levothyroxine (SYNTHROID, LEVOTHROID) 50 MCG tablet Take 50 mcg by mouth daily.        . magnesium oxide (MAG-OX) 400 MG tablet Take 400 mg by mouth daily.      . metFORMIN (GLUCOPHAGE) 500 MG tablet Take 500 mg by mouth 2 (two) times daily  with a meal.        . potassium chloride (K-DUR) 10 MEQ tablet Take 10 mEq by mouth daily.        . feeding supplement (ENSURE) PUDG Take 1 Container by mouth 3 (three) times daily with meals.  90 Container  0  . metoprolol (LOPRESSOR) 50 MG tablet Take 1 tablet (50 mg total) by mouth 2 (two) times daily.  60 tablet  0  . predniSONE (DELTASONE) 20 MG tablet Take 2 tablets (40 mg total) by mouth daily with breakfast.  10 tablet  0  . psyllium (HYDROCIL/METAMUCIL) 95 % PACK Take 1 packet by mouth 2 (two) times daily.  56 each  0  . saccharomyces boulardii (FLORASTOR) 250 MG capsule Take 1 capsule (250 mg total) by mouth 2 (two) times daily.  60 capsule  0  . traMADol (ULTRAM) 50 MG tablet Take 0.5 tablets (25 mg total) by mouth every 8 (eight) hours as needed for pain.  30 tablet  0    Allergies:  Allergies  Allergen Reactions  . Iohexol      Code: VOM, Desc: PT REPORTS VOMITING W/ IVP DYE- ARS 12/26/08, Onset Date: [id number]     History reviewed. No pertinent family history.  Social History:  reports that she has  never smoked. She has never used smokeless tobacco. She reports that she does not drink alcohol or use illicit drugs.  Review of Systems: negative except as above   Blood pressure 145/98, pulse 90, temperature 98.1 F (36.7 C), temperature source Oral, resp. rate 25, height 5\' 10"  (1.778 m), weight 76.658 kg (169 lb), SpO2 97.00%. Head: Normocephalic, without obvious abnormality, atraumatic Neck: no adenopathy, no carotid bruit, no JVD, supple, symmetrical, trachea midline and thyroid not enlarged, symmetric, no tenderness/mass/nodules Resp: clear to auscultation bilaterally Cardio: regular rate and rhythm, S1, S2 normal, no murmur, click, rub or gallop GI: Abdomen soft nontender Extremities: extremities normal, atraumatic, no cyanosis or edema  Results for orders placed during the hospital encounter of 08/12/12 (from the past 48 hour(s))  GLUCOSE, CAPILLARY     Status:  Abnormal   Collection Time   08/12/12  8:15 AM      Component Value Range Comment   Glucose-Capillary 106 (*) 70 - 99 mg/dL    No results found.  Assessment: Status post iatrogenic biliary leak during cholecystectomy, status post stent placement. Plan: Will plan to remove stent today.  Ludmilla Mcgillis C 08/12/2012, 9:00 AM

## 2012-08-12 NOTE — Op Note (Signed)
[facility] [address] [place], [address]   ERCP PROCEDURE REPORT  PATIENT: Nicole [name], Nicole [name].  MR# :[id number] BIRTHDATE: 02-27-1931  GENDER: Female ENDOSCOPIST: Dorena Cookey, MD REFERRED BY: PROCEDURE DATE:  08/12/2012 PROCEDURE: ASA CLASS: INDICATIONS:biliary leak from cholecystectomy MEDICATIONS: 80 mg fentanyl TOPICAL ANESTHETIC:  DESCRIPTION OF PROCEDURE:   After the risks benefits and alternatives of the procedure were thoroughly explained, informed consent was obtained.  The Pentax ERCP [id number]  endoscope was introduced through the mouth  and advanced to the papilla of Vater. There was a long section of a plastic stent extruding from the papilla. This was grasped with the snare and removed through the mouth with the scope intact. Due to the absence of any biliary leak seen on her previous ERCP I elected not to reintubate her and attempt opacification of the common bile duct.      .  The scope was then completely withdrawn from the patient and the procedure terminated.     COMPLICATIONS: none immediate  ENDOSCOPIC IMPRESSION: successful removal of previously placed biliary stent RECOMMENDATIONS: advance diet followup when necessary    _______________________________ eSignedDorena Cookey, MD 08/12/2012 9:35 AM   CC:

## 2012-08-14 ENCOUNTER — Other Ambulatory Visit (HOSPITAL_BASED_OUTPATIENT_CLINIC_OR_DEPARTMENT_OTHER): Payer: Medicare Other | Admitting: Lab

## 2012-08-14 ENCOUNTER — Ambulatory Visit (HOSPITAL_BASED_OUTPATIENT_CLINIC_OR_DEPARTMENT_OTHER): Payer: Medicare Other | Admitting: Oncology

## 2012-08-14 ENCOUNTER — Encounter (HOSPITAL_COMMUNITY): Payer: Self-pay | Admitting: Gastroenterology

## 2012-08-14 VITALS — BP 178/92 | HR 123 | Temp 99.0°F | Resp 18 | Ht 70.0 in | Wt 167.3 lb

## 2012-08-14 DIAGNOSIS — C189 Malignant neoplasm of colon, unspecified: Secondary | ICD-10-CM

## 2012-08-14 DIAGNOSIS — D649 Anemia, unspecified: Secondary | ICD-10-CM

## 2012-08-14 DIAGNOSIS — Z85038 Personal history of other malignant neoplasm of large intestine: Secondary | ICD-10-CM

## 2012-08-14 LAB — CBC WITH DIFFERENTIAL/PLATELET
BASO%: 0.4 % (ref 0.0–2.0)
Basophils Absolute: 0 10*3/uL (ref 0.0–0.1)
EOS%: 2.5 % (ref 0.0–7.0)
HGB: 12.2 g/dL (ref 11.6–15.9)
MCH: 25.6 pg (ref 25.1–34.0)
MCHC: 32.9 g/dL (ref 31.5–36.0)
RDW: 20.2 % — ABNORMAL HIGH (ref 11.2–14.5)
lymph#: 2.3 10*3/uL (ref 0.9–3.3)

## 2012-08-14 LAB — CEA: CEA: 3.9 ng/mL (ref 0.0–5.0)

## 2012-08-14 LAB — LACTATE DEHYDROGENASE (CC13): LDH: 168 U/L (ref 125–245)

## 2012-08-14 NOTE — Progress Notes (Signed)
This office note has been dictated.  #[id number]

## 2012-08-15 NOTE — Progress Notes (Signed)
CC:   Bennett Scrape, MD Thereasa Solo. Little, M.D. Bernette Redbird, M.D.  PROBLEM LIST: 1. History of stage III adenocarcinoma of the descending colon, (T3 N1     M0) dating back to January 1999.  The patient underwent a left     hemicolectomy as well as an incidental splenectomy on 08/11/1997.     The patient underwent 6 cycles of adjuvant chemotherapy with 5     fluorouracil and leucovorin.  Most recent colonoscopy was carried     out by Dr. Charlotte Sanes in March 2013. 2. Atrial fibrillation. 3. History of congestive heart failure. 4. Aneurysm involving the ascending aorta.  Apparently, the patient is     not on Coumadin for this reason. 5. Hemoglobin C trait detected on 01/04/2003. 6. Hypertension. 7. Diabetes mellitus type 2. 8. Hypothyroidism. 9. Laparoscopic cholecystectomy carried out for chronic cholecystitis     on 05/14/2012.  The patient had leakage from her common bile duct     and had placement of a stent which apparently was removed by ERCP     on 08/12/2012. 10.History of gout.  MEDICATIONS:  Reviewed and recorded. Current Outpatient Prescriptions  Medication Sig Dispense Refill  . allopurinol (ZYLOPRIM) 100 MG tablet Take 100 mg by mouth as needed. For gout      . aspirin 81 MG tablet Take 81 mg by mouth daily.      Marland Kitchen atorvastatin (LIPITOR) 40 MG tablet Take 40 mg by mouth daily.       Marland Kitchen diltiazem (CARDIZEM CD) 180 MG 24 hr capsule Take 180 mg by mouth daily.      . ferrous sulfate 325 (65 FE) MG tablet Take 325 mg by mouth daily with breakfast.      . furosemide (LASIX) 40 MG tablet Take 40 mg by mouth daily.      Marland Kitchen glipiZIDE (GLUCOTROL) 10 MG tablet Take 10 mg by mouth daily.        Marland Kitchen levothyroxine (SYNTHROID, LEVOTHROID) 50 MCG tablet Take 50 mcg by mouth daily.        . magnesium oxide (MAG-OX) 400 MG tablet Take 400 mg by mouth daily.      . metFORMIN (GLUCOPHAGE) 500 MG tablet Take 500 mg by mouth 2 (two) times daily with a meal.        . polyethylene glycol  (MIRALAX / GLYCOLAX) packet Take 17 g by mouth daily.      . potassium chloride (K-DUR) 10 MEQ tablet Take 10 mEq by mouth daily.        . traMADol (ULTRAM) 50 MG tablet Take 0.5 tablets (25 mg total) by mouth every 8 (eight) hours as needed for pain.  30 tablet  0  IMMUNIZATIONS:  The patient had a flu shot in the fall of 2013.  SMOKING HISTORY:  The patient denies any history of cigarette smoking.   HISTORY:  Nicole [name] was seen today for followup of her stage III adenocarcinoma of the descending colon (T3 N1 M0 with 2 out of 7 positive lymph nodes).  Nicole [name] underwent a left hemicolectomy on 08/11/1997.  She also had an incidental splenectomy followed by 6 cycles of adjuvant chemotherapy with 5-FU and leucovorin.  She remains disease- free.  She apparently had a rather extended hospitalization from 05/12/2012 through 06/02/2012 related to gallbladder disease.  She underwent a laparoscopic cholecystectomy on 05/14/2012.  The patient seems to have made a good recovery from that surgery and those events. She tells me that  she had a stent placed and that that was removed by Dr. Dorena Cookey on 08/12/2012.  The patient was last seen by [place] on 08/09/2011.  There has been no evidence for colon cancer recurrence.  She underwent a colonoscopy by Dr. Bernette Redbird in March 2013.  We do not have a copy of that report, but apparently it was okay.  The patient denies any obvious blood in her stools.  She is here today with her goddaughter, Nicole [name].  The patient drives and seems to be generally active and independent.  PHYSICAL EXAMINATION:  General:  She looks well.  She will be 79 years old in August.  Weight is 167.3 pounds, down about 10 pounds from a few years ago.  Height 5 feet 10 inches, body surface area 1.94 meters squared.  Vital Signs:  Blood pressure today 178/92.  The patient was informed about her elevated blood pressure.  Pulse was irregular irregular, consistent  with atrial fibrillation at a rate of about 100. Other vital signs were normal.  HEENT:  There is no scleral icterus. Mouth and pharynx are benign.  She has a full upper plate and partial lower plate.  Lymph nodes:  No peripheral adenopathy palpable.  Lungs: Clear to percussion and auscultation.  Cardiac:  Irregular rhythm consistent with atrial fibrillation.  I did not hear a murmur or rub. Breasts:  Not examined.  The patient says that she is up-to-date on her mammograms which she has yearly.  Abdomen:  Benign, somewhat tender in the right upper quadrant, but soft.  No organomegaly or masses palpable. Extremities:  Left ankle is quite puffy, a little bit more so than the right ankle.  Neurologic:  Normal.  LABORATORY DATA:  White count 7.6, ANC 4.4, hemoglobin 12.2, hematocrit 37.2, platelets 286,000.  Chemistries today are pending, as is the CEA. CEA on 08/09/2011 was 3.0.  IMPRESSION AND PLAN:  Nicole [name] seems to be doing quite well at the present time.  She is now out 15 years from the time of diagnosis of her colon cancer, which dates back to January 1999.  There has been no evidence of recurrence.  The patient is up-to-date on colonoscopy and also mammograms.  While she was hospitalized she did have CT scans of the abdomen and pelvis.  This was in October 2013.  She also had chest x- rays.  At this point, the patient does not need to come back for ongoing followup.  We will be happy to see her as needed.    ______________________________ Samul Dada, M.D. DSM/MEDQ  D:  08/14/2012  T:  08/15/2012  Job:  [id number]

## 2012-10-15 ENCOUNTER — Other Ambulatory Visit: Payer: Self-pay | Admitting: *Deleted

## 2012-10-15 DIAGNOSIS — D509 Iron deficiency anemia, unspecified: Secondary | ICD-10-CM

## 2012-10-15 DIAGNOSIS — E039 Hypothyroidism, unspecified: Secondary | ICD-10-CM

## 2012-10-15 DIAGNOSIS — E119 Type 2 diabetes mellitus without complications: Secondary | ICD-10-CM

## 2012-11-11 ENCOUNTER — Other Ambulatory Visit: Payer: Self-pay | Admitting: Internal Medicine

## 2012-12-04 ENCOUNTER — Other Ambulatory Visit: Payer: Medicare Other

## 2012-12-04 DIAGNOSIS — E039 Hypothyroidism, unspecified: Secondary | ICD-10-CM

## 2012-12-04 DIAGNOSIS — D509 Iron deficiency anemia, unspecified: Secondary | ICD-10-CM

## 2012-12-04 DIAGNOSIS — E119 Type 2 diabetes mellitus without complications: Secondary | ICD-10-CM

## 2012-12-05 ENCOUNTER — Encounter: Payer: Self-pay | Admitting: Geriatric Medicine

## 2012-12-05 LAB — CBC WITH DIFFERENTIAL/PLATELET
Basophils Absolute: 0 10*3/uL (ref 0.0–0.2)
Basos: 0 % (ref 0–3)
Eos: 2 % (ref 0–5)
Eosinophils Absolute: 0.1 10*3/uL (ref 0.0–0.4)
HCT: 40.9 % (ref 34.0–46.6)
Hemoglobin: 13.6 g/dL (ref 11.1–15.9)
Immature Grans (Abs): 0 10*3/uL (ref 0.0–0.1)
Immature Granulocytes: 0 % (ref 0–2)
Lymphocytes Absolute: 2.9 10*3/uL (ref 0.7–3.1)
Lymphs: 40 % (ref 14–46)
MCH: 28.9 pg (ref 26.6–33.0)
MCHC: 33.3 g/dL (ref 31.5–35.7)
MCV: 87 fL (ref 79–97)
Monocytes Absolute: 0.8 10*3/uL (ref 0.1–0.9)
Monocytes: 10 % (ref 4–12)
Neutrophils Absolute: 3.5 10*3/uL (ref 1.4–7.0)
Neutrophils Relative %: 48 % (ref 40–74)
RBC: 4.71 x10E6/uL (ref 3.77–5.28)
RDW: 16.9 % — ABNORMAL HIGH (ref 12.3–15.4)
WBC: 7.2 10*3/uL (ref 3.4–10.8)

## 2012-12-05 LAB — BASIC METABOLIC PANEL
BUN/Creatinine Ratio: 13 (ref 11–26)
BUN: 19 mg/dL (ref 8–27)
CO2: 30 mmol/L — ABNORMAL HIGH (ref 19–28)
Calcium: 9.8 mg/dL (ref 8.6–10.2)
Chloride: 100 mmol/L (ref 97–108)
Creatinine, Ser: 1.42 mg/dL — ABNORMAL HIGH (ref 0.57–1.00)
GFR calc Af Amer: 41 mL/min/{1.73_m2} — ABNORMAL LOW (ref >59)
GFR calc non Af Amer: 35 mL/min/{1.73_m2} — ABNORMAL LOW (ref >59)
Glucose: 103 mg/dL — ABNORMAL HIGH (ref 65–99)
Potassium: 4.1 mmol/L (ref 3.5–5.2)
Sodium: 145 mmol/L — ABNORMAL HIGH (ref 134–144)

## 2012-12-05 LAB — HEMOGLOBIN A1C
Est. average glucose Bld gHb Est-mCnc: 123 mg/dL
Hgb A1c MFr Bld: 5.9 % — ABNORMAL HIGH (ref 4.8–5.6)

## 2012-12-08 ENCOUNTER — Ambulatory Visit (INDEPENDENT_AMBULATORY_CARE_PROVIDER_SITE_OTHER): Payer: Medicare Other | Admitting: Internal Medicine

## 2012-12-08 ENCOUNTER — Encounter: Payer: Self-pay | Admitting: Internal Medicine

## 2012-12-08 VITALS — BP 142/90 | HR 85 | Temp 97.7°F | Resp 18 | Ht 70.0 in | Wt 177.0 lb

## 2012-12-08 DIAGNOSIS — R109 Unspecified abdominal pain: Secondary | ICD-10-CM

## 2012-12-08 DIAGNOSIS — E119 Type 2 diabetes mellitus without complications: Secondary | ICD-10-CM

## 2012-12-08 DIAGNOSIS — I4891 Unspecified atrial fibrillation: Secondary | ICD-10-CM

## 2012-12-08 DIAGNOSIS — I10 Essential (primary) hypertension: Secondary | ICD-10-CM

## 2012-12-08 DIAGNOSIS — D649 Anemia, unspecified: Secondary | ICD-10-CM

## 2012-12-08 DIAGNOSIS — E039 Hypothyroidism, unspecified: Secondary | ICD-10-CM | POA: Insufficient documentation

## 2012-12-08 DIAGNOSIS — N179 Acute kidney failure, unspecified: Secondary | ICD-10-CM

## 2012-12-08 NOTE — Assessment & Plan Note (Signed)
Rate is controlled.  Is on baby asa and CCB for rate control.

## 2012-12-08 NOTE — Progress Notes (Signed)
Patient ID: Nicole [name], female   DOB: Aug 28, 1930, 79 y.o.   MRN: [id number] Code Status: Has living will  Allergies  Allergen Reactions  . Iohexol      Code: VOM, Desc: PT REPORTS VOMITING W/ IVP DYE- ARS 12/26/08, Onset Date: [id number]     Chief Complaint  Patient presents with  . Medical Managment of Chronic Issues    patient thinks she is not healing properly from gallbladder surgery    HPI: Patient is a 79 y.o. AA female seen in the office today for medical mgt of chronic diseases.  She is concerned that she is not healing properly from her gallbladder surgery which was 10/13.  She has back pain.  She thinks she has a phobia b/c they had to go back in.  Did have a large hematoma after the surgery.  Hurts at beltline and feels numb sometimes.  Pain comes and goes, sometimes none at all.  If strains to do something, will get more painful.  No change in bowel habits.  No change in urination--has chronic frequency.  No dysuria.  No hematuria.  Has gained weight.  Says she's been eating and not exercising.   Review of Systems:  Review of Systems  Constitutional: Negative for fever, chills and weight loss.       Gained weight  Eyes: Negative for blurred vision.  Respiratory: Negative for shortness of breath.   Cardiovascular: Negative for chest pain.  Gastrointestinal: Negative for abdominal pain, diarrhea, constipation, blood in stool and melena.  Genitourinary: Positive for frequency and flank pain. Negative for dysuria and urgency.       Right side  Musculoskeletal: Negative for falls.       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  Neurological: Negative for dizziness, loss of consciousness, weakness and headaches.  Psychiatric/Behavioral: Negative for depression and memory loss. The patient is not nervous/anxious and does not have insomnia.      Past Medical History  Diagnosis Date  . Colon cancer 07/1997  .  Hypertension   . Diabetes mellitus   . CHF (congestive heart failure)   . AAA (abdominal aortic aneurysm)   . Atrial fibrillation   . Hypercholesteremia   . Kidney stone     renal calculi  . Small bowel obstruction due to adhesions 05/16/2012  . Hypothyroidism   . Arthritis   . Anemia   . Cholelithiasis   . Perforation of bile duct   . Hemorrhage of rectum and anus   . Swelling, mass, or lump in head and neck   . Cervicalgia   . Hypoglycemia, unspecified   . Pain in joint, lower leg   . Anxiety   . Shortness of breath   . Acute upper respiratory infections of unspecified site   . Depression   . Thoracic aneurysm without mention of rupture   . GERD (gastroesophageal reflux disease)   . Proteinuria   . Other specified cardiac dysrhythmias   . Congestive heart failure, unspecified   . Electrolyte and fluid disorders not elsewhere classified   . Other chronic allergic conjunctivitis   . Hypopotassemia   . Chronic systolic heart failure   . Dizziness and giddiness   . Malignant neoplasm of colon, unspecified site   . Other and unspecified hyperlipidemia   . Gout, unspecified   . Atrial fibrillation   . Aortic aneurysm of unspecified site without mention of rupture   .  Pain in joint, site unspecified   . Palpitations    Past Surgical History  Procedure Laterality Date  . Hemicolectomy  08/11/1997  . Kidney stone surgery    . Colon surgery      to remove colon ca  . Cholecystectomy  05/14/2012    Procedure: LAPAROSCOPIC CHOLECYSTECTOMY WITH INTRAOPERATIVE CHOLANGIOGRAM;  Surgeon: Currie Paris, MD;  Location: [facility] OR;  Service: General;  Laterality: N/A;  . Ercp  05/17/2012    Procedure: ENDOSCOPIC RETROGRADE CHOLANGIOPANCREATOGRAPHY (ERCP);  Surgeon: Barrie Folk, MD;  Location: [facility] OR;  Service: Gastroenterology;  Laterality: N/A;  . Ercp  08/12/2012    Procedure: ENDOSCOPIC RETROGRADE CHOLANGIOPANCREATOGRAPHY (ERCP);  Surgeon: Barrie Folk, MD;  Location: [facility]  ENDOSCOPY;  Service: Endoscopy;  Laterality: N/A;   Social History:   reports that she has never smoked. She has never used smokeless tobacco. She reports that she does not drink alcohol or use illicit drugs.  Family History  Problem Relation Age of Onset  . Heart disease Mother   . Stroke Father   . Hypertension Daughter   . Hypertension Son   . Diabetes Sister   . Hypertension Son     Medications: Patient's Medications  New Prescriptions   No medications on file  Previous Medications   ALLOPURINOL (ZYLOPRIM) 100 MG TABLET    TAKE 1 TABLET BY MOUTH  BY MOUTH TWICE DAILY FOR CHRONIC GOUT   ASPIRIN 81 MG TABLET    Take 81 mg by mouth daily.   ATORVASTATIN (LIPITOR) 40 MG TABLET    Take 40 mg by mouth daily.    DILTIAZEM (CARDIZEM CD) 180 MG 24 HR CAPSULE    Take 180 mg by mouth daily.   FERROUS SULFATE 325 (65 FE) MG TABLET    Take 325 mg by mouth daily with breakfast.   FUROSEMIDE (LASIX) 40 MG TABLET    Take 40 mg by mouth daily.   GLIPIZIDE (GLUCOTROL) 10 MG TABLET    Take 10 mg by mouth daily.     LEVOTHYROXINE (SYNTHROID, LEVOTHROID) 50 MCG TABLET    Take 50 mcg by mouth daily.     MAGNESIUM OXIDE (MAG-OX) 400 MG TABLET    Take 400 mg by mouth daily.   METFORMIN (GLUCOPHAGE) 500 MG TABLET    Take 500 mg by mouth 2 (two) times daily with a meal.     POLYETHYLENE GLYCOL (MIRALAX / GLYCOLAX) PACKET    Take 17 g by mouth daily.   POTASSIUM CHLORIDE (K-DUR) 10 MEQ TABLET    Take 10 mEq by mouth daily.     TRAMADOL (ULTRAM) 50 MG TABLET    Take 0.5 tablets (25 mg total) by mouth every 8 (eight) hours as needed for pain.  Modified Medications   No medications on file  Discontinued Medications   No medications on file   Physical Exam: Filed Vitals:   12/08/12 0831  BP: 142/90  Pulse: 85  Temp: 97.7 F (36.5 C)  TempSrc: Oral  Resp: 18  Height: 5\' 10"  (1.778 m)  Weight: 177 lb (80.287 kg)  SpO2: 96%  Physical Exam     Labs reviewed: 06/28/11 CBC; WBC 7.4, RBC 4.38,  HGB 11.9 09/17/2011 HA1C 5.8  12/14/11 A1c 5.9 Lipid Panel; Cholesterol 188, Triglycerides 87, HDL 65, LDL 106 05/14/2012  ([place]) BMP: glucose 112, BUN 21, Creatinine 1.34 Magnesium 1.9 CBC: wbc 9.5, rbc 4.33, Hemoglobin 12.8 05/15/2012 ([place]) BMP: glucose 158, BUN 21, Creatinine 2.33 06/09/2012 BMP; Glucose  331, BUN 20, Creatinine 1.10 CBC; WBC 8.7, RBC 3.75, HGB 10.1 Uric Acid 4.7 CBC: wbc 8.5, rbc 3.81, Hemoglobin 10.9 BNP: 2820.0 06/02/2012 ([place]) BMP: glucose 256, BUN 11, Creatinine 0.93, Sodium 139, potassium 3.9 Liver: AST 25, ALT 20, Albumin 2.1 CBC: wbc 9.4, Hemoglobin 8.7  Basic Metabolic Panel:  Recent Labs  16/10/96 0400 05/26/12 0510 05/27/12 0500  05/31/12 0500 06/02/12 0500 12/04/12 0815  NA 138 138 134*  < > 142 139 145*  K 3.8 4.1 3.9  < > 3.7 3.9 4.1  CL 104 103 99  < > 104 101 100  CO2 29 30 30   < > 33* 30 30*  GLUCOSE 124* 125* 226*  < > 145* 256* 103*  BUN 18 17 16   < > 8 11 19   CREATININE 0.86 0.97 0.82  < > 0.87 0.93 1.42*  CALCIUM 8.3* 8.3* 8.4  < > 8.7 9.1 9.8  MG 1.8 1.8 1.7  --   --   --   --   PHOS 3.6 3.0 3.0  --   --   --   --   < > = values in this interval not displayed. Liver Function Tests:  Recent Labs  05/26/12 0510 05/28/12 0449 05/29/12 0410  AST 18 29 25   ALT 10 20 20   ALKPHOS 101 115 113  BILITOT 0.5 0.4 0.4  PROT 6.1 6.2 6.4  ALBUMIN 1.9* 2.1* 2.1*   No results found for this basename: LIPASE, AMYLASE,  in the last 8760 hours No results found for this basename: AMMONIA,  in the last 8760 hours CBC:  Recent Labs  05/31/12 0500 06/02/12 0500 08/14/12 1014 12/04/12 0815  WBC 9.4 9.4 7.6 7.2  NEUTROABS  --   --  4.4 3.5  HGB 8.3* 8.7* 12.2 13.6  HCT 24.3* 25.2* 37.2 40.9  MCV 81.0 79.7 77.6* 87  PLT 596* 599* 286  --    Lipid Panel:  Recent Labs  05/19/12 0415 05/26/12 0510  CHOL 111 124  TRIG 72 93    Past Procedures: 1999 Colonoscopy 2007 2-D echo ejection fraction 25% 2008  Mammogram 07/30/2007 Bone Density- normal 06/15/2008 Mammogram negative 10/13/2008 Colonoscopy small polyp in cerium, few polyps in sigmoid colon, diverticulosis in entire  colon- Dr. Matthias Hughs 12/25/2008 Chest x-ray stable cardiomegaly and enlarged central pulmonary arteries, no acute cardiopulmonary disease. 12/26/2008  CT chest stable ascending thoracic aortic aneurysm 4.8cm 02/12/2009 Chest x-ray stable 07/18/2009 Mammogram-scattered fibroglandular densities left breast 07/24/2009 [place] Left breast normal 1/11 echo-nl LV ef 50-55% 12/02/2009 Chest X Ray - Stable cardiac enlargement and chronic bronchitic type lung changes. 01/24/2010 - Mammogram - BI-RADS Probably Benign Findings- Initial short interval follow up suggested. Abnormality noted on ultrasound likely represents a hematoma. 07/25/2010 Mammogram - Negative 05/12/12:  Abdominal US - Intraoperative cholangiogram. No discrete filling defects to suggest the presence of choledocholithiasis. 05/12/2012-CT Abdomen and Pelvis: Findings consistent with small bowel obstruction likely due to adhesions. No mass or evidence of bowel ischemia is present on this uninfused study. Small to moderate volume of perihepatic and pelvic hemorrhage. Some of the fluid about the liver could be bowel although this is felt less likely. Status post splenectomy. Small bilateral pleural effusions. 05/15/2012-Renal Ultrasound: Normal exam. no hydronephrosis or solid renal mass. Ascites 05/15/2012-Chest X-Ray: Enlargement of cardiac silhouette with pulmonary vascular congestion. Minimal bibasilar atelectasis. 05/15/2012-Acute Abdomen Series: Low volume chest. Cholecystectomy. Bowel gas pattern compatible with ileus. 05/16/2012-Nuclear Medicine Hepatobiliary Imaging: Bile leak centered along the inferior right hepatic  margin and spilling into aathe right pericolic gutter. Bile does flow through the common bile duct into the small bowel. 05/22/2012-CT Abdomen: Interval insertion of  biliary stent. Almost complete resolution of the fluid adjacent to the right lobe of the liver. Stable perihepatic hemorrhage adjacent to the inferior aspect of the left and right lobes of the liver. Increaseing bilateral pleural effusions with increased atelectasis at both lung bases. persistent partial small bowel obstruction. 05/23/2012-Chest X-Ray: No pneumothorax and mildly improved bibasilar ventilation following thoracentesis.  Assessment/Plan HTN (hypertension) BP at goal.  Continue current regimen.    Anemia Now resolved.  Has finally recovered from her surgery from this standpoint.  ARF (acute renal failure) Cr up to 1.42.  She has not been taking nsaids.  Her bp has been reasonable.  Will check urinalysis due to flank pain, as well.  ?pyelo, ?nephrolithiasis  I wanted to do an ultrasound of her kidneys but she has refused.  Await urinalysis results with micro  Atrial fibrillation with RVR Rate is controlled.  Is on baby asa and CCB for rate control.  Hypothyroidism F/u TSH before next visit.  Continue current synthroid dose.   Labs/tests ordered:  Urinalysis with micro today, cmp and tsh before next visit in 3 mos

## 2012-12-08 NOTE — Assessment & Plan Note (Signed)
Now resolved.  Has finally recovered from her surgery from this standpoint.

## 2012-12-08 NOTE — Patient Instructions (Signed)
I will let you know what your urine specimen shows.  Let me know if you continue to have the flank pain or you develop burning on urination, pain over your bladder or note blood in your urine.  Come for labs before your next visit to check your thyroid and liver, kidneys.

## 2012-12-08 NOTE — Assessment & Plan Note (Signed)
Cr up to 1.42.  She has not been taking nsaids.  Her bp has been reasonable.  Will check urinalysis due to flank pain, as well.  ?pyelo, ?nephrolithiasis  I wanted to do an ultrasound of her kidneys but she has refused.  Await urinalysis results with micro

## 2012-12-08 NOTE — Assessment & Plan Note (Signed)
F/u TSH before next visit.  Continue current synthroid dose.

## 2012-12-08 NOTE — Assessment & Plan Note (Signed)
BP at goal. Continue current regimen.  

## 2013-01-21 ENCOUNTER — Other Ambulatory Visit: Payer: Self-pay | Admitting: Internal Medicine

## 2013-01-28 ENCOUNTER — Encounter: Payer: Self-pay | Admitting: *Deleted

## 2013-01-29 ENCOUNTER — Ambulatory Visit (INDEPENDENT_AMBULATORY_CARE_PROVIDER_SITE_OTHER): Payer: Medicare Other | Admitting: Internal Medicine

## 2013-01-29 ENCOUNTER — Encounter: Payer: Self-pay | Admitting: Internal Medicine

## 2013-01-29 VITALS — BP 142/80 | HR 107 | Temp 99.1°F | Resp 18 | Ht 70.0 in | Wt 178.4 lb

## 2013-01-29 DIAGNOSIS — R143 Flatulence: Secondary | ICD-10-CM

## 2013-01-29 DIAGNOSIS — J069 Acute upper respiratory infection, unspecified: Secondary | ICD-10-CM

## 2013-01-29 DIAGNOSIS — R141 Gas pain: Secondary | ICD-10-CM

## 2013-01-29 DIAGNOSIS — R140 Abdominal distension (gaseous): Secondary | ICD-10-CM

## 2013-01-29 NOTE — Patient Instructions (Signed)
Diabetic cough syrup at pharmacy may help Drink plenty of water especially before the CT abdomen/pelvis Get enough rest Hopefully, you will recover quickly and can return to exercise

## 2013-01-29 NOTE — Progress Notes (Signed)
Patient ID: Nicole [name], female   DOB: 1930-07-26, 79 y.o.   MRN: [id number] Location:  [facility]   Allergies  Allergen Reactions  . Iohexol      Code: VOM, Desc: PT REPORTS VOMITING W/ IVP DYE- ARS 12/26/08, Onset Date: [id number]     Chief Complaint  Patient presents with  . Acute Visit    cough x 2 weeks w/mucus/phelegm. nose drainage(yellow)    HPI: Patient is a 79 y.o. AA female seen in the office today for URI.   Has had congestion in nose and chest.  Has been the worst cold ever.  Will be 2 wks Saturday.  No fever.  Having stress incontinence with coughing.  Phlegm had been yellow.  Now clear.  Cough not keeping her up at night.  Doesn't know if she's been drinking enough fluids.  Appetite ok.    Review of Systems:  Review of Systems  Constitutional: Negative for fever and chills.  HENT: Positive for congestion. Negative for sore throat.   Eyes: Negative for blurred vision.  Respiratory: Positive for cough and sputum production.   Cardiovascular: Negative for chest pain.  Gastrointestinal: Negative for heartburn and constipation.  Genitourinary: Positive for urgency.       Incontinence with cough  Musculoskeletal: Negative for myalgias.  Psychiatric/Behavioral: Negative for memory loss.     Past Medical History  Diagnosis Date  . Colon cancer 07/1997  . Hypertension   . Diabetes mellitus   . CHF (congestive heart failure)   . AAA (abdominal aortic aneurysm)   . Atrial fibrillation   . Hypercholesteremia   . Kidney stone     renal calculi  . Small bowel obstruction due to adhesions 05/16/2012  . Hypothyroidism   . Arthritis   . Anemia   . Cholelithiasis   . Perforation of bile duct   . Hemorrhage of rectum and anus   . Swelling, mass, or lump in head and neck   . Cervicalgia   . Hypoglycemia, unspecified   . Pain in joint, lower leg   . Anxiety   . Shortness of breath   . Acute upper respiratory infections of  unspecified site   . Depression   . Thoracic aneurysm without mention of rupture   . GERD (gastroesophageal reflux disease)   . Proteinuria   . Other specified cardiac dysrhythmias(427.89)   . Congestive heart failure, unspecified   . Electrolyte and fluid disorders not elsewhere classified   . Other chronic allergic conjunctivitis   . Hypopotassemia   . Chronic systolic heart failure   . Dizziness and giddiness   . Malignant neoplasm of colon, unspecified site   . Other and unspecified hyperlipidemia   . Gout, unspecified   . Atrial fibrillation   . Aortic aneurysm of unspecified site without mention of rupture   . Pain in joint, site unspecified   . Palpitations     Past Surgical History  Procedure Laterality Date  . Hemicolectomy  08/11/1997  . Kidney stone surgery    . Colon surgery      to remove colon ca  . Cholecystectomy  05/14/2012    Procedure: LAPAROSCOPIC CHOLECYSTECTOMY WITH INTRAOPERATIVE CHOLANGIOGRAM;  Surgeon: Currie Paris, MD;  Location: [facility] OR;  Service: General;  Laterality: N/A;  . Ercp  05/17/2012    Procedure: ENDOSCOPIC RETROGRADE CHOLANGIOPANCREATOGRAPHY (ERCP);  Surgeon: Barrie Folk, MD;  Location: [facility] OR;  Service: Gastroenterology;  Laterality: N/A;  . Ercp  08/12/2012    Procedure: ENDOSCOPIC RETROGRADE CHOLANGIOPANCREATOGRAPHY (ERCP);  Surgeon: Barrie Folk, MD;  Location: [facility] ENDOSCOPY;  Service: Endoscopy;  Laterality: N/A;    Social History:   reports that she has never smoked. She has never used smokeless tobacco. She reports that she does not drink alcohol or use illicit drugs.  Family History  Problem Relation Age of Onset  . Heart disease Mother   . Stroke Father   . Hypertension Daughter   . Hypertension Son   . Diabetes Sister   . Hypertension Son     Medications: Patient's Medications  New Prescriptions   No medications on file  Previous Medications   ALLOPURINOL (ZYLOPRIM) 100 MG TABLET    TAKE 1 TABLET BY MOUTH  BY  MOUTH TWICE DAILY FOR CHRONIC GOUT   ASPIRIN 81 MG TABLET    Take 81 mg by mouth daily.   ATORVASTATIN (LIPITOR) 40 MG TABLET    Take 40 mg by mouth daily.    DILTIAZEM (CARDIZEM CD) 180 MG 24 HR CAPSULE    Take 180 mg by mouth daily.   FERROUS SULFATE 325 (65 FE) MG TABLET    Take 325 mg by mouth daily with breakfast.   FUROSEMIDE (LASIX) 40 MG TABLET    Take 40 mg by mouth daily.   GLIPIZIDE (GLUCOTROL) 10 MG TABLET    Take 10 mg by mouth daily.     KLOR-CON M20 20 MEQ TABLET    take 1 tablet by mouth once daily   LEVOTHYROXINE (SYNTHROID, LEVOTHROID) 50 MCG TABLET    Take 50 mcg by mouth daily.     MAGNESIUM OXIDE (MAG-OX) 400 MG TABLET    Take 400 mg by mouth daily.   METFORMIN (GLUCOPHAGE) 500 MG TABLET    Take 500 mg by mouth 2 (two) times daily with a meal.     POLYETHYLENE GLYCOL (MIRALAX / GLYCOLAX) PACKET    Take 17 g by mouth daily.   POTASSIUM CHLORIDE (K-DUR) 10 MEQ TABLET    Take 10 mEq by mouth daily.     TRAMADOL (ULTRAM) 50 MG TABLET    Take 0.5 tablets (25 mg total) by mouth every 8 (eight) hours as needed for pain.  Modified Medications   No medications on file  Discontinued Medications   No medications on file     Physical Exam: Filed Vitals:   01/29/13 1512  BP: 142/80  Pulse: 107  Temp: 99.1 F (37.3 C)  TempSrc: Oral  Resp: 18  Height: 5\' 10"  (1.778 m)  Weight: 178 lb 6.4 oz (80.922 kg)  SpO2: 95%  Physical Exam  HENT:  Head: Normocephalic and atraumatic.  Right Ear: External ear normal.  Left Ear: External ear normal.  Mouth/Throat: Oropharyngeal exudate present.  Nasal congestion   Eyes: Conjunctivae and EOM are normal. Pupils are equal, round, and reactive to light.  Neck: Neck supple.  Cardiovascular: Regular rhythm, normal heart sounds and intact distal pulses.   Pulmonary/Chest: Effort normal.  Few rhonchi  Abdominal: Soft. Bowel sounds are normal. She exhibits distension. She exhibits no mass. There is tenderness. No hernia.   Lymphadenopathy:    She has no cervical adenopathy.  Neurological: She is alert.  Skin: Skin is warm and dry.  Psychiatric: She has a normal mood and affect.    Labs reviewed: Basic Metabolic Panel:  Recent Labs  47/82/95 0400 05/26/12 0510 05/27/12 0500  05/31/12 0500 06/02/12 0500 12/04/12 0815  NA 138 138 134*  < >  142 139 145*  K 3.8 4.1 3.9  < > 3.7 3.9 4.1  CL 104 103 99  < > 104 101 100  CO2 29 30 30   < > 33* 30 30*  GLUCOSE 124* 125* 226*  < > 145* 256* 103*  BUN 18 17 16   < > 8 11 19   CREATININE 0.86 0.97 0.82  < > 0.87 0.93 1.42*  CALCIUM 8.3* 8.3* 8.4  < > 8.7 9.1 9.8  MG 1.8 1.8 1.7  --   --   --   --   PHOS 3.6 3.0 3.0  --   --   --   --   < > = values in this interval not displayed. Liver Function Tests:  Recent Labs  05/26/12 0510 05/28/12 0449 05/29/12 0410  AST 18 29 25   ALT 10 20 20   ALKPHOS 101 115 113  BILITOT 0.5 0.4 0.4  PROT 6.1 6.2 6.4  ALBUMIN 1.9* 2.1* 2.1*  CBC:  Recent Labs  05/31/12 0500 06/02/12 0500 08/14/12 1014 12/04/12 0815  WBC 9.4 9.4 7.6 7.2  NEUTROABS  --   --  4.4 3.5  HGB 8.3* 8.7* 12.2 13.6  HCT 24.3* 25.2* 37.2 40.9  MCV 81.0 79.7 77.6* 87  PLT 596* 599* 286  --    Lipid Panel:  Recent Labs  05/19/12 0415 05/26/12 0510  CHOL 111 124  TRIG 72 93   Lab Results  Component Value Date   HGBA1C 5.9* 12/04/2012   Assessment/Plan 1. Abdominal distention - Basic metabolic panel - CT Abdomen Pelvis W Contrast; Future  2. URI, acute -improving on its own--encouraged fluids and rest  Labs/tests ordered: Orders Placed This Encounter  Procedures  . CT Abdomen Pelvis W Contrast    ? ALLERGY PT REPORTS VOMITING-MARTI AWARE//WT-178/PT IS DIAB/BUN-16 CREAT-1.23 DRAWN-01-29-13/PREV IN EPIC/NO NEEDS PER OFFICE/EPIC ORDER/[facility] COMP    Standing Status: Future     Number of Occurrences: 1     Standing Expiration Date: 05/01/2014    Order Specific Question:  Reason for Exam (SYMPTOM  OR DIAGNOSIS  REQUIRED)    Answer:  abdominal distention now, h/o cholecystitis, small bowel obstruction 10/13, common bile duct obstruction    Order Specific Question:  Preferred imaging location?    Answer:  [facility]  . Basic metabolic panel    Next appt:  As scheduled

## 2013-01-30 LAB — BASIC METABOLIC PANEL
BUN/Creatinine Ratio: 13 (ref 11–26)
BUN: 16 mg/dL (ref 8–27)
CO2: 28 mmol/L (ref 18–29)
Calcium: 9.9 mg/dL (ref 8.6–10.2)
Chloride: 100 mmol/L (ref 97–108)
Creatinine, Ser: 1.23 mg/dL — ABNORMAL HIGH (ref 0.57–1.00)
GFR calc Af Amer: 48 mL/min/{1.73_m2} — ABNORMAL LOW (ref >59)
GFR calc non Af Amer: 42 mL/min/{1.73_m2} — ABNORMAL LOW (ref >59)
Glucose: 80 mg/dL (ref 65–99)
Potassium: 3.6 mmol/L (ref 3.5–5.2)
Sodium: 143 mmol/L (ref 134–144)

## 2013-02-09 ENCOUNTER — Ambulatory Visit
Admission: RE | Admit: 2013-02-09 | Discharge: 2013-02-09 | Disposition: A | Discharge status: Home / Self Care | Payer: Medicare Other | Source: Ambulatory Visit | Attending: Internal Medicine | Admitting: Internal Medicine

## 2013-02-09 DIAGNOSIS — R140 Abdominal distension (gaseous): Secondary | ICD-10-CM

## 2013-02-09 MED ORDER — IOHEXOL 300 MG/ML  SOLN
100.0000 mL | Freq: Once | INTRAMUSCULAR | Status: AC | PRN
  Administered 2013-02-09: 100 mL via INTRAVENOUS

## 2013-02-24 ENCOUNTER — Ambulatory Visit (INDEPENDENT_AMBULATORY_CARE_PROVIDER_SITE_OTHER): Payer: Medicare Other | Admitting: Internal Medicine

## 2013-02-24 ENCOUNTER — Encounter: Payer: Self-pay | Admitting: *Deleted

## 2013-02-24 ENCOUNTER — Encounter: Payer: Self-pay | Admitting: Internal Medicine

## 2013-02-24 VITALS — BP 140/82 | HR 87 | Temp 97.9°F | Resp 14 | Ht 70.0 in | Wt 174.4 lb

## 2013-02-24 DIAGNOSIS — G5602 Carpal tunnel syndrome, left upper limb: Secondary | ICD-10-CM

## 2013-02-24 DIAGNOSIS — I10 Essential (primary) hypertension: Secondary | ICD-10-CM

## 2013-02-24 DIAGNOSIS — G5600 Carpal tunnel syndrome, unspecified upper limb: Secondary | ICD-10-CM

## 2013-02-24 MED ORDER — TRAMADOL HCL 50 MG PO TABS: 50.0000 mg | ORAL_TABLET | Freq: Three times a day (TID) | ORAL | Status: DC | PRN

## 2013-02-24 NOTE — Progress Notes (Signed)
Patient ID: JAMALA KOHEN, female   DOB: 07-11-1931, 79 y.o.   MRN: [id number]  Chief Complaint  Patient presents with  . Numbness    numbness in hands   Allergies  Allergen Reactions  . Iohexol      Code: VOM, Desc: PT REPORTS VOMITING W/ IVP DYE- ARS 12/26/08, Onset Date: [id number]   . Iodinated Diagnostic Agents Nausea Only    PT HAS HAD SINGULAR INCIDENT OF NAUSEA  W/ IV CONTYRAST INJECTION, NONE IF INJECTED SLOWLY//A.CALHOUN    HPI 79 y/o female patient is here with complaints of numbness in her left hand in her thumb, index and middle finger for 2 months now. She feels the numbness and discomfort mainly in the night times while she is lying down. No numbness at present but feels funny She also complaints of pain in the MCP joint of both her thumb No numbness elsewhere  Review of Systems  Respiratory: Negative for shortness of breath.   Cardiovascular: Negative for chest pain.  Gastrointestinal: Negative for abdominal pain, diarrhea, constipation, blood in stool and melena.  Skin: Negative for rash.  Neurological: Negative for dizziness, loss of consciousness, weakness and headaches.  Psychiatric/Behavioral: Negative for depression and memory loss. The patient is not nervous/anxious and does not have insomnia.    BP 140/82  Pulse 87  Temp(Src) 97.9 F (36.6 C) (Oral)  Resp 14  Ht 5\' 10"  (1.778 m)  Wt 174 lb 6.4 oz (79.107 kg)  BMI 25.02 kg/m2  gen-  Elderly female pt in NAD Ext- phalen's sign positive, good radial pulses, sensation intact cvs- ns 1,s2, rrr respi- CTAB Neuro- aaox3, no focal deficit  Assessment/plan  Right hand pain-  Clinical presentation fits carpal tunnel syndrome. Will have her wear wrist brace for now especially at bedtime for few weeks and reassess. If no improvement can consider steroid course vs steroid injection vs median nerve release. Explained to patient about different option, to take pain meds prn - increased her tramadol to 50 q8h prn  for now. Reassess if no improvement  HTN- Continue current bp regimen

## 2013-02-25 ENCOUNTER — Encounter: Payer: Self-pay | Admitting: Internal Medicine

## 2013-02-25 DIAGNOSIS — G5600 Carpal tunnel syndrome, unspecified upper limb: Secondary | ICD-10-CM | POA: Insufficient documentation

## 2013-02-26 ENCOUNTER — Encounter: Payer: Self-pay | Admitting: Internal Medicine

## 2013-02-26 ENCOUNTER — Ambulatory Visit (INDEPENDENT_AMBULATORY_CARE_PROVIDER_SITE_OTHER): Payer: Medicare Other | Admitting: Internal Medicine

## 2013-02-26 VITALS — BP 144/80 | HR 85 | Ht 70.0 in | Wt 178.1 lb

## 2013-02-26 DIAGNOSIS — E119 Type 2 diabetes mellitus without complications: Secondary | ICD-10-CM

## 2013-02-26 DIAGNOSIS — I4891 Unspecified atrial fibrillation: Secondary | ICD-10-CM

## 2013-02-26 DIAGNOSIS — I10 Essential (primary) hypertension: Secondary | ICD-10-CM

## 2013-02-26 DIAGNOSIS — I712 Thoracic aortic aneurysm, without rupture, unspecified: Secondary | ICD-10-CM

## 2013-02-26 DIAGNOSIS — I5040 Unspecified combined systolic (congestive) and diastolic (congestive) heart failure: Secondary | ICD-10-CM

## 2013-02-26 DIAGNOSIS — N179 Acute kidney failure, unspecified: Secondary | ICD-10-CM

## 2013-02-26 MED ORDER — APIXABAN 5 MG PO TABS: ORAL_TABLET | ORAL | Status: DC

## 2013-02-26 NOTE — Progress Notes (Signed)
OFFICE NOTE  Chief Complaint:  Routine office visit  Primary Care Physician: Bufford Spikes, DO  HPI:  Nicole [name] is a 79 year old female with history of chronic atrial fibrillation and controlled ventricular response. She was previously followed by Dr. Clarene Duke, has a history of congestive heart failure with an EF of about 45% and moderate MR in the past. She has a thoracic aneurysm which has been asymptomatic, as well as hypertension, diabetes, dyslipidemia, and recently was admitted for gallbladder surgery which was complicated by bleeding issues. She has been deemed not a good candidate for Coumadin due to a number of issues with compliance by Dr. Clarene Duke, and has been on aspirin and Plavix. Unfortunately, with regard to the Plavix she had complicated bleeding postoperatively from her gallbladder surgery and Plavix was discontinued due to this. It turns out that there is little benefit to it additional Plavix on top of aspirin for stroke reduction. As far as her stroke risk is concerned, it is actually fairly high considering she is in chronic atrial fibrillation and does have a history of hypertension and diabetes and is a female with a CHADS2 score of 3, a CHADS VASc score of 3-1/2 or 4.  Based on this we again discussed anticoagulation today, as she is quite a vital person and would certainly be markedly debilitated if she were to have a stroke. I do feel that most of her bleeding was related to procedures, and she could possibly tolerate a novel oral anticoagulant with a more favorable adverse bleeding profile and easier compliance.  PMHx:  Past Medical History  Diagnosis Date  . Colon cancer 07/1997  . Hypertension   . Diabetes mellitus   . CHF (congestive heart failure)   . AAA (abdominal aortic aneurysm)   . Atrial fibrillation   . Hypercholesteremia   . Kidney stone     renal calculi  . Small bowel obstruction due to adhesions 05/16/2012  . Hypothyroidism   . Arthritis   .  Anemia   . Cholelithiasis   . Perforation of bile duct   . Hemorrhage of rectum and anus   . Swelling, mass, or lump in head and neck   . Cervicalgia   . Hypoglycemia, unspecified   . Pain in joint, lower leg   . Anxiety   . Shortness of breath   . Acute upper respiratory infections of unspecified site   . Depression   . Thoracic aneurysm without mention of rupture   . GERD (gastroesophageal reflux disease)   . Proteinuria   . Other specified cardiac dysrhythmias(427.89)   . Congestive heart failure, unspecified   . Electrolyte and fluid disorders not elsewhere classified   . Other chronic allergic conjunctivitis   . Hypopotassemia   . Chronic systolic heart failure   . Dizziness and giddiness   . Malignant neoplasm of colon, unspecified site   . Other and unspecified hyperlipidemia   . Gout, unspecified   . Atrial fibrillation   . Aortic aneurysm of unspecified site without mention of rupture   . Pain in joint, site unspecified   . Palpitations   . CKD (chronic kidney disease)     Past Surgical History  Procedure Laterality Date  . Hemicolectomy  08/11/1997  . Kidney stone surgery    . Colon surgery      to remove colon ca  . Cholecystectomy  05/14/2012    Procedure: LAPAROSCOPIC CHOLECYSTECTOMY WITH INTRAOPERATIVE CHOLANGIOGRAM;  Surgeon: Currie Paris, MD;  Location: [facility] OR;  Service: General;  Laterality: N/A;  . Ercp  05/17/2012    Procedure: ENDOSCOPIC RETROGRADE CHOLANGIOPANCREATOGRAPHY (ERCP);  Surgeon: Barrie Folk, MD;  Location: [facility] OR;  Service: Gastroenterology;  Laterality: N/A;  . Ercp  08/12/2012    Procedure: ENDOSCOPIC RETROGRADE CHOLANGIOPANCREATOGRAPHY (ERCP);  Surgeon: Barrie Folk, MD;  Location: [facility] ENDOSCOPY;  Service: Endoscopy;  Laterality: N/A;  . Transthoracic echocardiogram  11/2009    EF 45-50%, mild-mod AV regurg; mild MR; mod TR; ascending aorta mildly dilated  . Cardiac catheterization  2008    moderate severe pulm HTN; with elevted pulm  capillary wedge pressure     FAMHx:  Family History  Problem Relation Age of Onset  . Heart disease Mother   . Stroke Father   . Hypertension Daughter   . Hypertension Son   . Diabetes Sister   . Hypertension Son     SOCHx:   reports that she has never smoked. She has never used smokeless tobacco. She reports that she does not drink alcohol or use illicit drugs.  ALLERGIES:  Allergies  Allergen Reactions  . Iohexol      Code: VOM, Desc: PT REPORTS VOMITING W/ IVP DYE- ARS 12/26/08, Onset Date: [id number]   . Z-Pak (Azithromycin)   . Iodinated Diagnostic Agents Nausea Only    PT HAS HAD SINGULAR INCIDENT OF NAUSEA  W/ IV CONTYRAST INJECTION, NONE IF INJECTED SLOWLY//A.CALHOUN    ROS: A comprehensive review of systems was negative except for: Cardiovascular: positive for palpitations  HOME MEDS: Current Outpatient Prescriptions  Medication Sig Dispense Refill  . allopurinol (ZYLOPRIM) 100 MG tablet TAKE 1 TABLET BY MOUTH  BY MOUTH TWICE DAILY FOR CHRONIC GOUT  60 tablet  3  . atorvastatin (LIPITOR) 40 MG tablet Take 40 mg by mouth daily.       Marland Kitchen diltiazem (CARDIZEM CD) 180 MG 24 hr capsule Take 180 mg by mouth daily.      . furosemide (LASIX) 40 MG tablet Take 40 mg by mouth daily.      Marland Kitchen glipiZIDE (GLUCOTROL) 10 MG tablet Take 10 mg by mouth daily.        Marland Kitchen KLOR-CON M20 20 MEQ tablet take 1 tablet by mouth once daily  30 tablet  3  . levothyroxine (SYNTHROID, LEVOTHROID) 50 MCG tablet Take 50 mcg by mouth daily.        . magnesium oxide (MAG-OX) 400 MG tablet Take 400 mg by mouth daily.      . metFORMIN (GLUCOPHAGE) 500 MG tablet Take 500 mg by mouth 2 (two) times daily with a meal.        . polyethylene glycol (MIRALAX / GLYCOLAX) packet Take 17 g by mouth daily.      . potassium chloride (K-DUR) 10 MEQ tablet Take 10 mEq by mouth daily.        . traMADol (ULTRAM) 50 MG tablet Take 1 tablet (50 mg total) by mouth every 8 (eight) hours as needed for pain.  30 tablet  0  .  apixaban (ELIQUIS) 5 MG TABS tablet Take 1 tablet by mouth every 12 hours.  56 tablet  0   No current facility-administered medications for this visit.    LABS/IMAGING: No results found for this or any previous visit (from the past 48 hour(s)). No results found.  VITALS: BP 144/80  Pulse 85  Ht 5\' 10"  (1.778 m)  Wt 178 lb 1.6 oz (80.786 kg)  BMI 25.55 kg/m2  EXAM: General appearance: alert and no  distress Neck: no adenopathy, no carotid bruit, no JVD, supple, symmetrical, trachea midline and thyroid not enlarged, symmetric, no tenderness/mass/nodules Lungs: clear to auscultation bilaterally Heart: regular rate and rhythm, S1, S2 normal, no murmur, click, rub or gallop Abdomen: soft, non-tender; bowel sounds normal; no masses,  no organomegaly Extremities: extremities normal, atraumatic, no cyanosis or edema Pulses: 2+ and symmetric Skin: Skin color, texture, turgor normal. No rashes or lesions Neurologic: Grossly normal  EKG: Atrial fibrillation at 85, incomplete right bundle branch block  ASSESSMENT: 1. Chronic atrial fibrillation on aspirin 2. History of congestive heart failure, EF of 45% 3. Hypertension 4. History of thoracic aneurysm 5. Diabetes 6. Dyslipidemia 7. Chronic kidney disease  PLAN: 1.   Ms. Kenagy continues to be in atrial fibrillation and has a significant risk for stroke. After some discussion she seems to be agreeable to taking a novel oral anticoagulant, which may have more favorable adverse bleeding risk profile. I selected Eliquis 5 mg po BID after discussion with our clinical pharmacist.  We will go ahead and stop aspirin as there is no initial indication for this.  I provided samples and a prescription for her today. She will need repeat imaging of her thoracic aorta to judge for changes in the size of her aneurysm at her next visit.  Chrystie Nose, MD, [facility] Attending Cardiologist [facility]  Nicole [name]  C 02/26/2013, 7:14 PM

## 2013-02-26 NOTE — Patient Instructions (Signed)
Your physician has recommended you make the following change in your medication: Stop Aspirin 81 mg. Start taking Eliquis 5mg  one tablet twice daily (every 12 hours).   Your physician wants you to follow-up in: 6 months.  You will receive a reminder letter in the mail two months in advance. If you don't receive a letter, please call our office to schedule the follow-up appointment.

## 2013-03-09 ENCOUNTER — Other Ambulatory Visit: Payer: Self-pay | Admitting: Internal Medicine

## 2013-03-25 ENCOUNTER — Ambulatory Visit (INDEPENDENT_AMBULATORY_CARE_PROVIDER_SITE_OTHER): Payer: Medicare Other | Admitting: Internal Medicine

## 2013-03-25 ENCOUNTER — Encounter: Payer: Self-pay | Admitting: Internal Medicine

## 2013-03-25 VITALS — BP 134/80 | HR 88 | Temp 98.1°F | Resp 18 | Ht 70.0 in | Wt 177.0 lb

## 2013-03-25 DIAGNOSIS — E119 Type 2 diabetes mellitus without complications: Secondary | ICD-10-CM

## 2013-03-25 DIAGNOSIS — K5900 Constipation, unspecified: Secondary | ICD-10-CM

## 2013-03-25 DIAGNOSIS — G5600 Carpal tunnel syndrome, unspecified upper limb: Secondary | ICD-10-CM

## 2013-03-25 DIAGNOSIS — K5909 Other constipation: Secondary | ICD-10-CM | POA: Insufficient documentation

## 2013-03-25 DIAGNOSIS — I4891 Unspecified atrial fibrillation: Secondary | ICD-10-CM

## 2013-03-25 DIAGNOSIS — G5602 Carpal tunnel syndrome, left upper limb: Secondary | ICD-10-CM

## 2013-03-25 NOTE — Progress Notes (Signed)
Patient ID: Nicole [name], female   DOB: 03-31-1931, 79 y.o.   MRN: [id number]  Chief Complaint  Patient presents with  . Follow-up   Allergies  Allergen Reactions  . Iohexol      Code: VOM, Desc: PT REPORTS VOMITING W/ IVP DYE- ARS 12/26/08, Onset Date: [id number]   . Z-Pak [Azithromycin]   . Iodinated Diagnostic Agents Nausea Only    PT HAS HAD SINGULAR INCIDENT OF NAUSEA  W/ IV CONTYRAST INJECTION, NONE IF INJECTED SLOWLY//[facility]    HPI  Patient is here today for routine follow up. She was supposed to see Dr Renato Gails and mentions having a couple of questions that she would like to have answers for today. She had a surgery in December 2013 and wants to know if her gallstones were removed or her gallbladder was removed as well during the procedure She also had obstruction seen in her ct scan of abdomen post surgery and would like to know if it is still there. She has 2 bowel movement a day at present, passing flatus and is taking miralax prn She has started wearing the wrist brace in her left hand and says this has helped her but her pain is not under control. She is taking tramadol 50 mg bid for pain (was prescribed tramadol 50 mg q8h prn last visit) She has been taking her eliquis and tolerating it well. She is taking her glipizide and metformin. Complaint with her medications   Review of Systems:  Constitutional: Negative for fever, chills and weight loss.   Eyes: Negative for blurred vision.  Respiratory: Negative for shortness of breath.   Cardiovascular: Negative for chest pain and palpitations Gastrointestinal: Negative for abdominal pain, diarrhea, constipation, blood in stool and melena.  Genitourinary: Negative for dysuria and urgency.   Musculoskeletal: Negative for falls.        Has numbness of 3 fingers on left hand Skin: Negative for rash.  Neurological: Negative for dizziness, loss of consciousness, weakness and headaches.  Psychiatric/Behavioral: Negative for  depression and memory loss. The patient is not nervous/anxious and does not have insomnia.     Past Medical History  Diagnosis Date  . Colon cancer 07/1997  . Hypertension   . Diabetes mellitus   . CHF (congestive heart failure)   . AAA (abdominal aortic aneurysm)   . Atrial fibrillation   . Hypercholesteremia   . Kidney stone     renal calculi  . Small bowel obstruction due to adhesions 05/16/2012  . Hypothyroidism   . Arthritis   . Anemia   . Cholelithiasis   . Perforation of bile duct   . Hemorrhage of rectum and anus   . Swelling, mass, or lump in head and neck   . Cervicalgia   . Hypoglycemia, unspecified   . Pain in joint, lower leg   . Anxiety   . Shortness of breath   . Acute upper respiratory infections of unspecified site   . Depression   . Thoracic aneurysm without mention of rupture   . GERD (gastroesophageal reflux disease)   . Proteinuria   . Other specified cardiac dysrhythmias(427.89)   . Congestive heart failure, unspecified   . Electrolyte and fluid disorders not elsewhere classified   . Other chronic allergic conjunctivitis   . Hypopotassemia   . Chronic systolic heart failure   . Dizziness and giddiness   . Malignant neoplasm of colon, unspecified site   . Other and unspecified hyperlipidemia   . Gout, unspecified   .  Atrial fibrillation   . Aortic aneurysm of unspecified site without mention of rupture   . Pain in joint, site unspecified   . Palpitations   . CKD (chronic kidney disease)     Past Surgical History  Procedure Laterality Date  . Hemicolectomy  08/11/1997  . Kidney stone surgery    . Colon surgery      to remove colon ca  . Cholecystectomy  05/14/2012    Procedure: LAPAROSCOPIC CHOLECYSTECTOMY WITH INTRAOPERATIVE CHOLANGIOGRAM;  Surgeon: Currie Paris, MD;  Location: [facility] OR;  Service: General;  Laterality: N/A;  . Ercp  05/17/2012    Procedure: ENDOSCOPIC RETROGRADE CHOLANGIOPANCREATOGRAPHY (ERCP);  Surgeon: Barrie Folk,  MD;  Location: [facility] OR;  Service: Gastroenterology;  Laterality: N/A;  . Ercp  08/12/2012    Procedure: ENDOSCOPIC RETROGRADE CHOLANGIOPANCREATOGRAPHY (ERCP);  Surgeon: Barrie Folk, MD;  Location: [facility] ENDOSCOPY;  Service: Endoscopy;  Laterality: N/A;  . Transthoracic echocardiogram  11/2009    EF 45-50%, mild-mod AV regurg; mild MR; mod TR; ascending aorta mildly dilated  . Cardiac catheterization  2008    moderate severe pulm HTN; with elevted pulm capillary wedge pressure    Current Outpatient Prescriptions on File Prior to Visit  Medication Sig Dispense Refill  . allopurinol (ZYLOPRIM) 100 MG tablet take 1 tablet by mouth twice a day for CHRONIC GOUT  60 tablet  3  . apixaban (ELIQUIS) 5 MG TABS tablet Take 1 tablet by mouth every 12 hours.  56 tablet  0  . atorvastatin (LIPITOR) 40 MG tablet Take 40 mg by mouth daily.       Marland Kitchen diltiazem (CARDIZEM CD) 180 MG 24 hr capsule Take 180 mg by mouth daily.      . furosemide (LASIX) 40 MG tablet Take 40 mg by mouth daily.      Marland Kitchen glipiZIDE (GLUCOTROL) 10 MG tablet Take 10 mg by mouth daily.        Marland Kitchen KLOR-CON M20 20 MEQ tablet take 1 tablet by mouth once daily  30 tablet  3  . levothyroxine (SYNTHROID, LEVOTHROID) 50 MCG tablet Take 50 mcg by mouth daily.        . magnesium oxide (MAG-OX) 400 MG tablet Take 400 mg by mouth daily.      . metFORMIN (GLUCOPHAGE) 500 MG tablet Take 500 mg by mouth 2 (two) times daily with a meal.        . polyethylene glycol (MIRALAX / GLYCOLAX) packet Take 17 g by mouth daily.      . potassium chloride (K-DUR) 10 MEQ tablet Take 10 mEq by mouth daily.        . traMADol (ULTRAM) 50 MG tablet Take 1 tablet (50 mg total) by mouth every 8 (eight) hours as needed for pain.  30 tablet  0   No current facility-administered medications on file prior to visit.    BP 134/80  Pulse 88  Temp(Src) 98.1 F (36.7 C) (Oral)  Resp 18  Ht 5\' 10"  (1.778 m)  Wt 177 lb (80.287 kg)  BMI 25.4 kg/m2  SpO2 97%  gen- elderly female  patient in NAD HEENT- No pallor, no icterus, no LAD, MMM, no JVD cvs- irregular heart rate, no murmur respi- CTAB abdo- bs+, soft, non tender Ext- able to move all 4, has wrist brace in left hand Neuro- aaox 3, no focal deficit Psych- anxious, mood stable   Labs reviewed  CBC    Component Value Date/Time   WBC 7.2 12/04/2012 0815  WBC 7.6 08/14/2012 1014   WBC 9.4 06/02/2012 0500   RBC 4.71 12/04/2012 0815   RBC 4.79 08/14/2012 1014   RBC 3.16* 06/02/2012 0500   HGB 13.6 12/04/2012 0815   HGB 12.2 08/14/2012 1014   HCT 40.9 12/04/2012 0815   HCT 37.2 08/14/2012 1014   PLT 286 08/14/2012 1014   PLT 599* 06/02/2012 0500   MCV 87 12/04/2012 0815   MCV 77.6* 08/14/2012 1014   MCH 28.9 12/04/2012 0815   MCH 25.6 08/14/2012 1014   MCH 27.5 06/02/2012 0500   MCHC 33.3 12/04/2012 0815   MCHC 32.9 08/14/2012 1014   MCHC 34.5 06/02/2012 0500   RDW 16.9* 12/04/2012 0815   RDW 20.2* 08/14/2012 1014   RDW 16.2* 06/02/2012 0500   LYMPHSABS 2.9 12/04/2012 0815   LYMPHSABS 2.3 08/14/2012 1014   LYMPHSABS 0.8 05/29/2012 0410   MONOABS 0.7 08/14/2012 1014   MONOABS 1.1* 05/29/2012 0410   EOSABS 0.1 12/04/2012 0815   EOSABS 0.2 05/29/2012 0410   BASOSABS 0.0 12/04/2012 0815   BASOSABS 0.0 08/14/2012 1014   BASOSABS 0.0 05/29/2012 0410    CMP     Component Value Date/Time   NA 143 01/29/2013 1557   NA 139 06/02/2012 0500   K 3.6 01/29/2013 1557   CL 100 01/29/2013 1557   CO2 28 01/29/2013 1557   GLUCOSE 80 01/29/2013 1557   GLUCOSE 256* 06/02/2012 0500   BUN 16 01/29/2013 1557   BUN 11 06/02/2012 0500   CREATININE 1.23* 01/29/2013 1557   CALCIUM 9.9 01/29/2013 1557   PROT 6.4 05/29/2012 0410   ALBUMIN 2.1* 05/29/2012 0410   AST 25 05/29/2012 0410   ALT 20 05/29/2012 0410   ALKPHOS 113 05/29/2012 0410   BILITOT 0.4 05/29/2012 0410   GFRNONAA 42* 01/29/2013 1557   GFRAA 48* 01/29/2013 1557    Assessment/plan  Left hand carpal tunnel syndrome- prescribed tramadol 3 times a day and she is taking it only 2  times a day. Continue wrist brace and to take tramadol 50 mg q8h prn for now  Constipation- reviewed records. Had post op ileus, which has resolved now. She is having bowel movement 2 times a day but does not have sense of complete evacuation. Suggested to take miralax on daily basis with stool softener. She has stool softener at home. Patient agrees to take this. Reassured patient that she does not have a bowel obstruction  Dm type 2- continue metformin and glipizide for now. Monitor  afib- rate controlled. Continue eliquis with cardizem , continue statin  Explained to patient she had cholecystectomy  Spent > 30 minutes reassuring patient and answering her questions. Patient voices understanding the explanation and feeling reassured

## 2013-04-02 ENCOUNTER — Other Ambulatory Visit: Payer: Self-pay | Admitting: *Deleted

## 2013-04-02 MED ORDER — MAGNESIUM OXIDE 400 MG PO TABS: ORAL_TABLET | ORAL | Status: DC

## 2013-04-14 ENCOUNTER — Encounter: Payer: Self-pay | Admitting: Nurse Practitioner

## 2013-04-14 ENCOUNTER — Ambulatory Visit (INDEPENDENT_AMBULATORY_CARE_PROVIDER_SITE_OTHER): Payer: Medicare Other | Admitting: Nurse Practitioner

## 2013-04-14 VITALS — BP 140/80 | HR 63 | Temp 98.6°F | Resp 18 | Ht 70.0 in | Wt 175.8 lb

## 2013-04-14 DIAGNOSIS — R209 Unspecified disturbances of skin sensation: Secondary | ICD-10-CM

## 2013-04-14 DIAGNOSIS — R200 Anesthesia of skin: Secondary | ICD-10-CM

## 2013-04-14 MED ORDER — GABAPENTIN 100 MG PO CAPS: 100.0000 mg | ORAL_CAPSULE | Freq: Three times a day (TID) | ORAL | Status: DC

## 2013-04-14 NOTE — Progress Notes (Signed)
Patient ID: Nicole [name], female   DOB: 05/07/1931, 79 y.o.   MRN: [id number]   Allergies  Allergen Reactions  . Iohexol      Code: VOM, Desc: PT REPORTS VOMITING W/ IVP DYE- ARS 12/26/08, Onset Date: [id number]   . Z-Pak [Azithromycin]   . Iodinated Diagnostic Agents Nausea Only    PT HAS HAD SINGULAR INCIDENT OF NAUSEA  W/ IV CONTYRAST INJECTION, NONE IF INJECTED SLOWLY//A[facility]    Chief Complaint  Patient presents with  . Follow-up    still having pain in left hand    HPI: Patient is a 79 y.o. 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  Reports pain is a numb and tingling and cold sensation; worse when she moves the hand or tires to lift objects Review of Systems:  Review of Systems  Constitutional: Negative for fever, chills and malaise/fatigue.  Respiratory: Negative for shortness of breath.   Cardiovascular: Negative for chest pain.  Musculoskeletal: Positive for joint pain (pain in wrist with numbness to first 3 digits).  Neurological: Positive for tingling and sensory change. Negative for tremors, focal weakness and weakness.     Past Medical History  Diagnosis Date  . Colon cancer 07/1997  . Hypertension   . Diabetes mellitus   . CHF (congestive heart failure)   . AAA (abdominal aortic aneurysm)   . Atrial fibrillation   . Hypercholesteremia   . Kidney stone     renal calculi  . Small bowel obstruction due to adhesions 05/16/2012  . Hypothyroidism   . Arthritis   . Anemia   . Cholelithiasis   . Perforation of bile duct   .  Hemorrhage of rectum and anus   . Swelling, mass, or lump in head and neck   . Cervicalgia   . Hypoglycemia, unspecified   . Pain in joint, lower leg   . Anxiety   . Shortness of breath   . Acute upper respiratory infections of unspecified site   . Depression   . Thoracic aneurysm without mention of rupture   . GERD (gastroesophageal reflux disease)   . Proteinuria   . Other specified cardiac dysrhythmias(427.89)   . Congestive heart failure, unspecified   . Electrolyte and fluid disorders not elsewhere classified   . Other chronic allergic conjunctivitis   . Hypopotassemia   . Chronic systolic heart failure   . Dizziness and giddiness   . Malignant neoplasm of colon, unspecified site   . Other and unspecified hyperlipidemia   . Gout, unspecified   . Atrial fibrillation   . Aortic aneurysm of unspecified site without mention of rupture   . Pain in joint, site unspecified   . Palpitations   . CKD (chronic kidney disease)    Past Surgical History  Procedure Laterality Date  . Hemicolectomy  08/11/1997  . Kidney stone surgery    . Colon surgery      to remove colon ca  . Cholecystectomy  05/14/2012    Procedure:  LAPAROSCOPIC CHOLECYSTECTOMY WITH INTRAOPERATIVE CHOLANGIOGRAM;  Surgeon: Currie Paris, MD;  Location: [facility] OR;  Service: General;  Laterality: N/A;  . Ercp  05/17/2012    Procedure: ENDOSCOPIC RETROGRADE CHOLANGIOPANCREATOGRAPHY (ERCP);  Surgeon: Barrie Folk, MD;  Location: [facility] OR;  Service: Gastroenterology;  Laterality: N/A;  . Ercp  08/12/2012    Procedure: ENDOSCOPIC RETROGRADE CHOLANGIOPANCREATOGRAPHY (ERCP);  Surgeon: Barrie Folk, MD;  Location: [facility] ENDOSCOPY;  Service: Endoscopy;  Laterality: N/A;  . Transthoracic echocardiogram  11/2009    EF 45-50%, mild-mod AV regurg; mild MR; mod TR; ascending aorta mildly dilated  . Cardiac catheterization  2008    moderate severe pulm HTN; with elevted pulm capillary wedge pressure    Social History:   reports that  she has never smoked. She has never used smokeless tobacco. She reports that she does not drink alcohol or use illicit drugs.  Family History  Problem Relation Age of Onset  . Heart disease Mother   . Stroke Father   . Hypertension Daughter   . Hypertension Son   . Diabetes Sister   . Hypertension Son     Medications: Patient's Medications  New Prescriptions   No medications on file  Previous Medications   ALLOPURINOL (ZYLOPRIM) 100 MG TABLET    take 1 tablet by mouth twice a day for CHRONIC GOUT   APIXABAN (ELIQUIS) 5 MG TABS TABLET    Take 1 tablet by mouth every 12 hours.   ATORVASTATIN (LIPITOR) 40 MG TABLET    Take 40 mg by mouth daily.    DILTIAZEM (CARDIZEM CD) 180 MG 24 HR CAPSULE    Take 180 mg by mouth daily.   FUROSEMIDE (LASIX) 40 MG TABLET    Take 40 mg by mouth daily.   GLIPIZIDE (GLUCOTROL) 10 MG TABLET    Take 10 mg by mouth daily.     KLOR-CON M20 20 MEQ TABLET    take 1 tablet by mouth once daily   LEVOTHYROXINE (SYNTHROID, LEVOTHROID) 50 MCG TABLET    Take 50 mcg by mouth daily.     MAGNESIUM OXIDE (MAG-OX) 400 MG TABLET    Take one tablet by mouth three times daily for magnesium supplement   METFORMIN (GLUCOPHAGE) 500 MG TABLET    Take 500 mg by mouth 2 (two) times daily with a meal.     POLYETHYLENE GLYCOL (MIRALAX / GLYCOLAX) PACKET    Take 17 g by mouth daily.   POTASSIUM CHLORIDE (K-DUR) 10 MEQ TABLET    Take 10 mEq by mouth daily.     SIMVASTATIN (ZOCOR) 40 MG TABLET    Take 40 mg by mouth at bedtime.    TRAMADOL (ULTRAM) 50 MG TABLET    Take 1 tablet (50 mg total) by mouth every 8 (eight) hours as needed for pain.  Modified Medications   No medications on file  Discontinued Medications   No medications on file     Physical Exam:  Filed Vitals:   04/14/13 1033  BP: 140/80  Pulse: 63  Temp: 98.6 F (37 C)  TempSrc: Oral  Resp: 18  Height: 5\' 10"  (1.778 m)  Weight: 175 lb 12.8 oz (79.742 kg)  SpO2: 95%    Physical Exam  Constitutional: She  is well-developed, well-nourished, and in no distress. No distress.  Cardiovascular: Normal rate.  An irregular rhythm present.  Pulmonary/Chest: Effort normal and breath sounds normal.  Musculoskeletal: She exhibits tenderness. She exhibits no edema.       Left wrist:  She exhibits decreased range of motion and tenderness. She exhibits no swelling.  In brace  Neurological: She is alert.  Reports decrease sensation to pinprick  Skin: Skin is warm and dry. She is not diaphoretic.    Labs reviewed: Basic Metabolic Panel:  Recent Labs  86/57/84 0400 05/26/12 0510 05/27/12 0500  06/02/12 0500 12/04/12 0815 01/29/13 1557  NA 138 138 134*  < > 139 145* 143  K 3.8 4.1 3.9  < > 3.9 4.1 3.6  CL 104 103 99  < > 101 100 100  CO2 29 30 30   < > 30 30* 28  GLUCOSE 124* 125* 226*  < > 256* 103* 80  BUN 18 17 16   < > 11 19 16   CREATININE 0.86 0.97 0.82  < > 0.93 1.42* 1.23*  CALCIUM 8.3* 8.3* 8.4  < > 9.1 9.8 9.9  MG 1.8 1.8 1.7  --   --   --   --   PHOS 3.6 3.0 3.0  --   --   --   --   < > = values in this interval not displayed. Liver Function Tests:  Recent Labs  05/26/12 0510 05/28/12 0449 05/29/12 0410  AST 18 29 25   ALT 10 20 20   ALKPHOS 101 115 113  BILITOT 0.5 0.4 0.4  PROT 6.1 6.2 6.4  ALBUMIN 1.9* 2.1* 2.1*   No results found for this basename: LIPASE, AMYLASE,  in the last 8760 hours No results found for this basename: AMMONIA,  in the last 8760 hours CBC:  Recent Labs  05/31/12 0500 06/02/12 0500 08/14/12 1014 12/04/12 0815  WBC 9.4 9.4 7.6 7.2  NEUTROABS  --   --  4.4 3.5  HGB 8.3* 8.7* 12.2 13.6  HCT 24.3* 25.2* 37.2 40.9  MCV 81.0 79.7 77.6* 87  PLT 596* 599* 286  --    Lipid Panel:  Recent Labs  05/19/12 0415 05/26/12 0510  CHOL 111 124  TRIG 72 93   Assessment/Plan 1. Numbness and tingling in left hand Possible carpel tunnel; no testing to confirm will order EMG  Stop tramadol pt reports this does not effect pain and will start gabapentin -  gabapentin (NEURONTIN) 100 MG capsule; Take 1 capsule (100 mg total) by mouth 3 (three) times daily.  Dispense: 90 capsule; Refill: 3 - NCV with EMG(electromyography); Future Cont to wear wrist brace To keep follow up with Dr Renato Gails

## 2013-04-14 NOTE — Patient Instructions (Addendum)
Stop taking tramadol  Start taking gabapentin 100 mg every 8 hours as needed for pain   Will get a nerve conduction to confirm carpel tunnel

## 2013-04-21 ENCOUNTER — Other Ambulatory Visit: Payer: Self-pay | Admitting: *Deleted

## 2013-04-21 MED ORDER — DILTIAZEM HCL ER COATED BEADS 180 MG PO CP24: 180.0000 mg | ORAL_CAPSULE | Freq: Every day | ORAL | Status: DC

## 2013-04-23 ENCOUNTER — Ambulatory Visit (INDEPENDENT_AMBULATORY_CARE_PROVIDER_SITE_OTHER): Payer: Medicare Other | Admitting: Internal Medicine

## 2013-04-23 ENCOUNTER — Encounter: Payer: Self-pay | Admitting: Internal Medicine

## 2013-04-23 VITALS — BP 160/82 | HR 78 | Temp 98.2°F | Wt 174.0 lb

## 2013-04-23 DIAGNOSIS — Z23 Encounter for immunization: Secondary | ICD-10-CM

## 2013-04-23 DIAGNOSIS — R7309 Other abnormal glucose: Secondary | ICD-10-CM

## 2013-04-23 DIAGNOSIS — K5900 Constipation, unspecified: Secondary | ICD-10-CM

## 2013-04-23 DIAGNOSIS — E039 Hypothyroidism, unspecified: Secondary | ICD-10-CM

## 2013-04-23 DIAGNOSIS — G5600 Carpal tunnel syndrome, unspecified upper limb: Secondary | ICD-10-CM

## 2013-04-23 DIAGNOSIS — K56600 Partial intestinal obstruction, unspecified as to cause: Secondary | ICD-10-CM

## 2013-04-23 DIAGNOSIS — K56609 Unspecified intestinal obstruction, unspecified as to partial versus complete obstruction: Secondary | ICD-10-CM

## 2013-04-23 DIAGNOSIS — R739 Hyperglycemia, unspecified: Secondary | ICD-10-CM

## 2013-04-23 DIAGNOSIS — G5602 Carpal tunnel syndrome, left upper limb: Secondary | ICD-10-CM

## 2013-04-23 NOTE — Progress Notes (Signed)
Patient ID: Nicole [name], female   DOB: 09-08-1930, 79 y.o.   MRN: [id number] Location:  [facility]  Code Status: DNR   Allergies  Allergen Reactions  . Iohexol      Code: VOM, Desc: PT REPORTS VOMITING W/ IVP DYE- ARS 12/26/08, Onset Date: [id number]   . Z-Pak [Azithromycin]   . Iodinated Diagnostic Agents Nausea Only    PT HAS HAD SINGULAR INCIDENT OF NAUSEA  W/ IV CONTYRAST INJECTION, NONE IF INJECTED SLOWLY//A.[facility]    Chief Complaint  Patient presents with  . Medical Managment of Chronic Issues    1 month follow-up, ongoing numbness and tingling in left hand     HPI: Patient is a 79 y.o. black female seen in the office today for f/u of her left hand numbness and her previous difficulty with partial small bowel obstruction with ongoing constipation.    I had seen Nicole [name] several months ago and she had c/o increased abdominal distention really ever since her cholecystectomy that was complicated by CBD obstruction.  She did not get her CT abdomen/pelvis until several weeks after I ordered it.  It then showed possible partial SBO (in July).  She now comes asking questions about this.  She notes she continues to have difficulty with moving her bowels once (with use of miralax) and then a second time later so she worries there is still some kind of blockage in there.    Hasn't slept in 2-3 wks due to left hand being numb--first three digits.  Was started on gabapentin by Shanda Bumps, NP last time with some benefit.  Asks about taking an extra pill at bedtime--this is ok.  Is wearing brace on left arm.  Did play piano for 60 years.  She is left handed and that is the hand that was affected.  She is continuing to play piano on Sundays though it hurts.    Also, she shows me where she "mashed" her right second digit between the bowl and chair at the beauty salon.  She has been putting ice on there.    Review of Systems:  Review of Systems   Constitutional: Negative for fever, chills and weight loss.  HENT: Negative for congestion.   Eyes: Negative for blurred vision.  Respiratory: Negative for shortness of breath.   Cardiovascular: Negative for chest pain.  Gastrointestinal: Positive for heartburn and constipation. Negative for blood in stool and melena.       Notes belching and flatus at times  Genitourinary: Negative for dysuria.  Musculoskeletal: Negative for myalgias and falls.  Skin: Negative for rash.  Neurological: Negative for dizziness.  Endo/Heme/Allergies: Does not bruise/bleed easily.  Psychiatric/Behavioral: Negative for memory loss.    Past Medical History  Diagnosis Date  . Colon cancer 07/1997  . Hypertension   . Diabetes mellitus   . CHF (congestive heart failure)   . AAA (abdominal aortic aneurysm)   . Atrial fibrillation   . Hypercholesteremia   . Kidney stone     renal calculi  . Small bowel obstruction due to adhesions 05/16/2012  . Hypothyroidism   . Arthritis   . Anemia   . Cholelithiasis   . Perforation of bile duct   . Hemorrhage of rectum and anus   . Swelling, mass, or lump in head and neck   . Cervicalgia   . Hypoglycemia, unspecified   . Pain in joint, lower leg   . Anxiety   . Shortness of breath   .  Acute upper respiratory infections of unspecified site   . Depression   . Thoracic aneurysm without mention of rupture   . GERD (gastroesophageal reflux disease)   . Proteinuria   . Other specified cardiac dysrhythmias(427.89)   . Congestive heart failure, unspecified   . Electrolyte and fluid disorders not elsewhere classified   . Other chronic allergic conjunctivitis   . Hypopotassemia   . Chronic systolic heart failure   . Dizziness and giddiness   . Malignant neoplasm of colon, unspecified site   . Other and unspecified hyperlipidemia   . Gout, unspecified   . Atrial fibrillation   . Aortic aneurysm of unspecified site without mention of rupture   . Pain in joint,  site unspecified   . Palpitations   . CKD (chronic kidney disease)     Past Surgical History  Procedure Laterality Date  . Hemicolectomy  08/11/1997  . Kidney stone surgery    . Colon surgery      to remove colon ca  . Cholecystectomy  05/14/2012    Procedure: LAPAROSCOPIC CHOLECYSTECTOMY WITH INTRAOPERATIVE CHOLANGIOGRAM;  Surgeon: Currie Paris, MD;  Location: [facility] OR;  Service: General;  Laterality: N/A;  . Ercp  05/17/2012    Procedure: ENDOSCOPIC RETROGRADE CHOLANGIOPANCREATOGRAPHY (ERCP);  Surgeon: Barrie Folk, MD;  Location: [facility] OR;  Service: Gastroenterology;  Laterality: N/A;  . Ercp  08/12/2012    Procedure: ENDOSCOPIC RETROGRADE CHOLANGIOPANCREATOGRAPHY (ERCP);  Surgeon: Barrie Folk, MD;  Location: [facility] ENDOSCOPY;  Service: Endoscopy;  Laterality: N/A;  . Transthoracic echocardiogram  11/2009    EF 45-50%, mild-mod AV regurg; mild MR; mod TR; ascending aorta mildly dilated  . Cardiac catheterization  2008    moderate severe pulm HTN; with elevted pulm capillary wedge pressure     Social History:   reports that she has never smoked. She has never used smokeless tobacco. She reports that she does not drink alcohol or use illicit drugs.  Family History  Problem Relation Age of Onset  . Heart disease Mother   . Stroke Father   . Hypertension Daughter   . Hypertension Son   . Diabetes Sister   . Hypertension Son     Medications: Patient's Medications  New Prescriptions   No medications on file  Previous Medications   ALLOPURINOL (ZYLOPRIM) 100 MG TABLET    take 1 tablet by mouth twice a day for CHRONIC GOUT   APIXABAN (ELIQUIS) 5 MG TABS TABLET    Take 1 tablet by mouth every 12 hours.   ATORVASTATIN (LIPITOR) 40 MG TABLET    Take 40 mg by mouth daily.    DILTIAZEM (CARDIZEM CD) 180 MG 24 HR CAPSULE    Take 1 capsule (180 mg total) by mouth daily.   FUROSEMIDE (LASIX) 40 MG TABLET    Take 40 mg by mouth daily.   GABAPENTIN (NEURONTIN) 100 MG CAPSULE    Take 1  capsule (100 mg total) by mouth 3 (three) times daily.   GLIPIZIDE (GLUCOTROL) 10 MG TABLET    Take 10 mg by mouth daily.     KLOR-CON M20 20 MEQ TABLET    take 1 tablet by mouth once daily   LEVOTHYROXINE (SYNTHROID, LEVOTHROID) 50 MCG TABLET    Take 50 mcg by mouth daily.     MAGNESIUM OXIDE (MAG-OX) 400 MG TABLET    Take one tablet by mouth three times daily for magnesium supplement   METFORMIN (GLUCOPHAGE) 500 MG TABLET    Take 500 mg by  mouth 2 (two) times daily with a meal.     POLYETHYLENE GLYCOL (MIRALAX / GLYCOLAX) PACKET    Take 17 g by mouth daily.   SIMVASTATIN (ZOCOR) 40 MG TABLET    Take 40 mg by mouth at bedtime.    TRAMADOL (ULTRAM) 50 MG TABLET    Take 1 tablet (50 mg total) by mouth every 8 (eight) hours as needed for pain.  Modified Medications   No medications on file  Discontinued Medications   POTASSIUM CHLORIDE (K-DUR) 10 MEQ TABLET    Take 10 mEq by mouth daily.       Physical Exam: Filed Vitals:   04/23/13 0812  BP: 160/82  Pulse: 78  Temp: 98.2 F (36.8 C)  TempSrc: Oral  Weight: 174 lb (78.926 kg)  SpO2: 98%  Physical Exam  Constitutional: She is oriented to person, place, and time. She appears well-developed and well-nourished. No distress.  HENT:  Head: Normocephalic and atraumatic.  Cardiovascular: Intact distal pulses.  Exam reveals no gallop and no friction rub.   No murmur heard. irreg irreg  Pulmonary/Chest: Breath sounds normal. No respiratory distress.  Abdominal: Soft. Bowel sounds are normal. She exhibits no distension. There is no tenderness.  Musculoskeletal: Normal range of motion. She exhibits no edema and no tenderness.  Neurological: She is alert and oriented to person, place, and time. No cranial nerve deficit.  Skin: Skin is warm and dry.  Psychiatric: She has a normal mood and affect.     Labs reviewed: Basic Metabolic Panel:  Recent Labs  16/10/96 0400 05/26/12 0510 05/27/12 0500  06/02/12 0500 12/04/12 0815  01/29/13 1557  NA 138 138 134*  < > 139 145* 143  K 3.8 4.1 3.9  < > 3.9 4.1 3.6  CL 104 103 99  < > 101 100 100  CO2 29 30 30   < > 30 30* 28  GLUCOSE 124* 125* 226*  < > 256* 103* 80  BUN 18 17 16   < > 11 19 16   CREATININE 0.86 0.97 0.82  < > 0.93 1.42* 1.23*  CALCIUM 8.3* 8.3* 8.4  < > 9.1 9.8 9.9  MG 1.8 1.8 1.7  --   --   --   --   PHOS 3.6 3.0 3.0  --   --   --   --   < > = values in this interval not displayed. Liver Function Tests:  Recent Labs  05/26/12 0510 05/28/12 0449 05/29/12 0410  AST 18 29 25   ALT 10 20 20   ALKPHOS 101 115 113  BILITOT 0.5 0.4 0.4  PROT 6.1 6.2 6.4  ALBUMIN 1.9* 2.1* 2.1*   No results found for this basename: LIPASE, AMYLASE,  in the last 8760 hours No results found for this basename: AMMONIA,  in the last 8760 hours CBC:  Recent Labs  05/31/12 0500 06/02/12 0500 08/14/12 1014 12/04/12 0815  WBC 9.4 9.4 7.6 7.2  NEUTROABS  --   --  4.4 3.5  HGB 8.3* 8.7* 12.2 13.6  HCT 24.3* 25.2* 37.2 40.9  MCV 81.0 79.7 77.6* 87  PLT 596* 599* 286  --    Lipid Panel:  Recent Labs  05/19/12 0415 05/26/12 0510  CHOL 111 124  TRIG 72 93   Lab Results  Component Value Date   HGBA1C 5.9* 12/04/2012    Past Procedures: 01/29/13:  CT abdomen/pelvis  1. No complete bowel obstruction. Mildly dilated loops of jejunum  with air fluid levels, which may  indicate early partial  obstruction.  2. Dilated common bile duct in the setting of cholecystectomy.  Clinical correlation is recommended.  3. Thickened gastric wall, likely representing gastritis.  Assessment/Plan 1. Need for prophylactic vaccination and inoculation against influenza -given flu shot today  2. Carpal tunnel syndrome, left -advised she needs to get the NCS to confirm the diagnosis and possibly will need surgery due to minimal improvement with brace and gabapentin -NCS has already been ordered but pt has not heard about appt and now does not want the test--I expect that if she  is not getting better, she will agree to this test -also may use 2 gabapentin at bedtime--will f/u renal function next time  3. Unspecified constipation -continue miralax for this  4. Partial small bowel obstruction -resolved  -discussed that she may have some adhesions s/p cholecystectomy and large hematoma she had after the surgery -she does not want this investigated any further and has no other symptoms (other than the 2 bms instead of one) to suggest a mass in her bowels or true obstruction (no weight loss, hematochezia, or melena)  5. Hypothyroidism -f/u labs - TSH -cont current synthroid  6. Hyperglycemia -was improving, but has not been exercising recently - check Hemoglobin A1c  Labs/tests ordered:  Tsh, hba1c today;  Will need bmp again next time due to gabapentin use Next appt:  3 mos unless she decides to come in sooner

## 2013-04-23 NOTE — Progress Notes (Deleted)
Patient ID: Nicole [name], female   DOB: 01-02-1931, 79 y.o.   MRN: [id number] Location:  [facility] / [facility]  Code Status: ***  Allergies  Allergen Reactions  . Iohexol      Code: VOM, Desc: PT REPORTS VOMITING W/ IVP DYE- ARS 12/26/08, Onset Date: [id number]   . Z-Pak [Azithromycin]   . Iodinated Diagnostic Agents Nausea Only    PT HAS HAD SINGULAR INCIDENT OF NAUSEA  W/ IV CONTYRAST INJECTION, NONE IF INJECTED SLOWLY//A.[facility]    Chief Complaint  Patient presents with  . Medical Managment of Chronic Issues    1 month follow-up, ongoing numbness and tingling in left hand     HPI: Patient is a 79 y.o. @LABRRGENDER @ seen in the office today for ***  Review of Systems:  ROS   Past Medical History  Diagnosis Date  . Colon cancer 07/1997  . Hypertension   . Diabetes mellitus   . CHF (congestive heart failure)   . AAA (abdominal aortic aneurysm)   . Atrial fibrillation   . Hypercholesteremia   . Kidney stone     renal calculi  . Small bowel obstruction due to adhesions 05/16/2012  . Hypothyroidism   . Arthritis   . Anemia   . Cholelithiasis   . Perforation of bile duct   . Hemorrhage of rectum and anus   . Swelling, mass, or lump in head and neck   . Cervicalgia   . Hypoglycemia, unspecified   . Pain in joint, lower leg   . Anxiety   . Shortness of breath   . Acute upper respiratory infections of unspecified site   . Depression   . Thoracic aneurysm without mention of rupture   . GERD (gastroesophageal reflux disease)   . Proteinuria   . Other specified cardiac dysrhythmias(427.89)   . Congestive heart failure, unspecified   . Electrolyte and fluid disorders not elsewhere classified   . Other chronic allergic conjunctivitis   . Hypopotassemia   . Chronic systolic heart failure   . Dizziness and giddiness   . Malignant neoplasm of colon, unspecified site   . Other and unspecified hyperlipidemia   . Gout, unspecified   .  Atrial fibrillation   . Aortic aneurysm of unspecified site without mention of rupture   . Pain in joint, site unspecified   . Palpitations   . CKD (chronic kidney disease)     Past Surgical History  Procedure Laterality Date  . Hemicolectomy  08/11/1997  . Kidney stone surgery    . Colon surgery      to remove colon ca  . Cholecystectomy  05/14/2012    Procedure: LAPAROSCOPIC CHOLECYSTECTOMY WITH INTRAOPERATIVE CHOLANGIOGRAM;  Surgeon: Currie Paris, MD;  Location: [facility] OR;  Service: General;  Laterality: N/A;  . Ercp  05/17/2012    Procedure: ENDOSCOPIC RETROGRADE CHOLANGIOPANCREATOGRAPHY (ERCP);  Surgeon: Barrie Folk, MD;  Location: [facility] OR;  Service: Gastroenterology;  Laterality: N/A;  . Ercp  08/12/2012    Procedure: ENDOSCOPIC RETROGRADE CHOLANGIOPANCREATOGRAPHY (ERCP);  Surgeon: Barrie Folk, MD;  Location: [facility] ENDOSCOPY;  Service: Endoscopy;  Laterality: N/A;  . Transthoracic echocardiogram  11/2009    EF 45-50%, mild-mod AV regurg; mild MR; mod TR; ascending aorta mildly dilated  . Cardiac catheterization  2008    moderate severe pulm HTN; with elevted pulm capillary wedge pressure     Social History:   reports that she has never smoked. She has never used smokeless tobacco. She  reports that she does not drink alcohol or use illicit drugs.  Family History  Problem Relation Age of Onset  . Heart disease Mother   . Stroke Father   . Hypertension Daughter   . Hypertension Son   . Diabetes Sister   . Hypertension Son     Medications: Patient's Medications  New Prescriptions   No medications on file  Previous Medications   ALLOPURINOL (ZYLOPRIM) 100 MG TABLET    take 1 tablet by mouth twice a day for CHRONIC GOUT   APIXABAN (ELIQUIS) 5 MG TABS TABLET    Take 1 tablet by mouth every 12 hours.   ATORVASTATIN (LIPITOR) 40 MG TABLET    Take 40 mg by mouth daily.    DILTIAZEM (CARDIZEM CD) 180 MG 24 HR CAPSULE    Take 1 capsule (180 mg total) by mouth daily.   FUROSEMIDE  (LASIX) 40 MG TABLET    Take 40 mg by mouth daily.   GABAPENTIN (NEURONTIN) 100 MG CAPSULE    Take 1 capsule (100 mg total) by mouth 3 (three) times daily.   GLIPIZIDE (GLUCOTROL) 10 MG TABLET    Take 10 mg by mouth daily.     KLOR-CON M20 20 MEQ TABLET    take 1 tablet by mouth once daily   LEVOTHYROXINE (SYNTHROID, LEVOTHROID) 50 MCG TABLET    Take 50 mcg by mouth daily.     MAGNESIUM OXIDE (MAG-OX) 400 MG TABLET    Take one tablet by mouth three times daily for magnesium supplement   METFORMIN (GLUCOPHAGE) 500 MG TABLET    Take 500 mg by mouth 2 (two) times daily with a meal.     POLYETHYLENE GLYCOL (MIRALAX / GLYCOLAX) PACKET    Take 17 g by mouth daily.   SIMVASTATIN (ZOCOR) 40 MG TABLET    Take 40 mg by mouth at bedtime.    TRAMADOL (ULTRAM) 50 MG TABLET    Take 1 tablet (50 mg total) by mouth every 8 (eight) hours as needed for pain.  Modified Medications   No medications on file  Discontinued Medications   POTASSIUM CHLORIDE (K-DUR) 10 MEQ TABLET    Take 10 mEq by mouth daily.       Physical Exam: Filed Vitals:   04/23/13 0812  BP: 160/82  Pulse: 78  Temp: 98.2 F (36.8 C)  TempSrc: Oral  Weight: 174 lb (78.926 kg)  SpO2: 98%   Physical Exam   Labs reviewed: Basic Metabolic Panel:  Recent Labs  40/98/11 0400 05/26/12 0510 05/27/12 0500  06/02/12 0500 12/04/12 0815 01/29/13 1557  NA 138 138 134*  < > 139 145* 143  K 3.8 4.1 3.9  < > 3.9 4.1 3.6  CL 104 103 99  < > 101 100 100  CO2 29 30 30   < > 30 30* 28  GLUCOSE 124* 125* 226*  < > 256* 103* 80  BUN 18 17 16   < > 11 19 16   CREATININE 0.86 0.97 0.82  < > 0.93 1.42* 1.23*  CALCIUM 8.3* 8.3* 8.4  < > 9.1 9.8 9.9  MG 1.8 1.8 1.7  --   --   --   --   PHOS 3.6 3.0 3.0  --   --   --   --   < > = values in this interval not displayed. Liver Function Tests:  Recent Labs  05/26/12 0510 05/28/12 0449 05/29/12 0410  AST 18 29 25   ALT 10 20 20   ALKPHOS  101 115 113  BILITOT 0.5 0.4 0.4  PROT 6.1 6.2 6.4   ALBUMIN 1.9* 2.1* 2.1*   No results found for this basename: LIPASE, AMYLASE,  in the last 8760 hours No results found for this basename: AMMONIA,  in the last 8760 hours CBC:  Recent Labs  05/31/12 0500 06/02/12 0500 08/14/12 1014 12/04/12 0815  WBC 9.4 9.4 7.6 7.2  NEUTROABS  --   --  4.4 3.5  HGB 8.3* 8.7* 12.2 13.6  HCT 24.3* 25.2* 37.2 40.9  MCV 81.0 79.7 77.6* 87  PLT 596* 599* 286  --    Lipid Panel:  Recent Labs  05/19/12 0415 05/26/12 0510  CHOL 111 124  TRIG 72 93   Lab Results  Component Value Date   HGBA1C 5.9* 12/04/2012    Past Procedures:  Assessment/Plan No problem-specific assessment & plan notes found for this encounter.    Labs/tests ordered: Next appt:

## 2013-04-23 NOTE — Progress Notes (Deleted)
Patient ID: Nicole [name], female   DOB: [birth year], 79 y.o.   MRN: [id number] Location:  [facility] / [facility]  Code Status: ***  Allergies  Allergen Reactions  . Iohexol      Code: VOM, Desc: PT REPORTS VOMITING W/ IVP DYE- ARS 12/26/08, Onset Date: [id number]   . Z-Pak [Azithromycin]   . Iodinated Diagnostic Agents Nausea Only    PT HAS HAD SINGULAR INCIDENT OF NAUSEA  W/ IV CONTYRAST INJECTION, NONE IF INJECTED SLOWLY//A.[facility]    Chief Complaint  Patient presents with  . Medical Managment of Chronic Issues    1 month follow-up, ongoing numbness and tingling in left hand     HPI: Patient is a 79 y.o. @LABRRGENDER @ seen in the office today for ***  Review of Systems:  ***(type .ros)  Past Medical History  Diagnosis Date  . Colon cancer 07/1997  . Hypertension   . Diabetes mellitus   . CHF (congestive heart failure)   . AAA (abdominal aortic aneurysm)   . Atrial fibrillation   . Hypercholesteremia   . Kidney stone     renal calculi  . Small bowel obstruction due to adhesions 05/16/2012  . Hypothyroidism   . Arthritis   . Anemia   . Cholelithiasis   . Perforation of bile duct   . Hemorrhage of rectum and anus   . Swelling, mass, or lump in head and neck   . Cervicalgia   . Hypoglycemia, unspecified   . Pain in joint, lower leg   . Anxiety   . Shortness of breath   . Acute upper respiratory infections of unspecified site   . Depression   . Thoracic aneurysm without mention of rupture   . GERD (gastroesophageal reflux disease)   . Proteinuria   . Other specified cardiac dysrhythmias(427.89)   . Congestive heart failure, unspecified   . Electrolyte and fluid disorders not elsewhere classified   . Other chronic allergic conjunctivitis   . Hypopotassemia   . Chronic systolic heart failure   . Dizziness and giddiness   . Malignant neoplasm of colon, unspecified site   . Other and unspecified hyperlipidemia   . Gout, unspecified    . Atrial fibrillation   . Aortic aneurysm of unspecified site without mention of rupture   . Pain in joint, site unspecified   . Palpitations   . CKD (chronic kidney disease)     Past Surgical History  Procedure Laterality Date  . Hemicolectomy  08/11/1997  . Kidney stone surgery    . Colon surgery      to remove colon ca  . Cholecystectomy  05/14/2012    Procedure: LAPAROSCOPIC CHOLECYSTECTOMY WITH INTRAOPERATIVE CHOLANGIOGRAM;  Surgeon: Currie Paris, MD;  Location: [facility] OR;  Service: General;  Laterality: N/A;  . Ercp  05/17/2012    Procedure: ENDOSCOPIC RETROGRADE CHOLANGIOPANCREATOGRAPHY (ERCP);  Surgeon: Barrie Folk, MD;  Location: [facility] OR;  Service: Gastroenterology;  Laterality: N/A;  . Ercp  08/12/2012    Procedure: ENDOSCOPIC RETROGRADE CHOLANGIOPANCREATOGRAPHY (ERCP);  Surgeon: Barrie Folk, MD;  Location: [facility] ENDOSCOPY;  Service: Endoscopy;  Laterality: N/A;  . Transthoracic echocardiogram  11/2009    EF 45-50%, mild-mod AV regurg; mild MR; mod TR; ascending aorta mildly dilated  . Cardiac catheterization  2008    moderate severe pulm HTN; with elevted pulm capillary wedge pressure     Social History:   reports that she has never smoked. She has never used smokeless tobacco. She  reports that she does not drink alcohol or use illicit drugs.  Family History  Problem Relation Age of Onset  . Heart disease Mother   . Stroke Father   . Hypertension Daughter   . Hypertension Son   . Diabetes Sister   . Hypertension Son     Medications: Patient's Medications  New Prescriptions   No medications on file  Previous Medications   ALLOPURINOL (ZYLOPRIM) 100 MG TABLET    take 1 tablet by mouth twice a day for CHRONIC GOUT   APIXABAN (ELIQUIS) 5 MG TABS TABLET    Take 1 tablet by mouth every 12 hours.   ATORVASTATIN (LIPITOR) 40 MG TABLET    Take 40 mg by mouth daily.    DILTIAZEM (CARDIZEM CD) 180 MG 24 HR CAPSULE    Take 1 capsule (180 mg total) by mouth daily.    FUROSEMIDE (LASIX) 40 MG TABLET    Take 40 mg by mouth daily.   GABAPENTIN (NEURONTIN) 100 MG CAPSULE    Take 1 capsule (100 mg total) by mouth 3 (three) times daily.   GLIPIZIDE (GLUCOTROL) 10 MG TABLET    Take 10 mg by mouth daily.     KLOR-CON M20 20 MEQ TABLET    take 1 tablet by mouth once daily   LEVOTHYROXINE (SYNTHROID, LEVOTHROID) 50 MCG TABLET    Take 50 mcg by mouth daily.     MAGNESIUM OXIDE (MAG-OX) 400 MG TABLET    Take one tablet by mouth three times daily for magnesium supplement   METFORMIN (GLUCOPHAGE) 500 MG TABLET    Take 500 mg by mouth 2 (two) times daily with a meal.     POLYETHYLENE GLYCOL (MIRALAX / GLYCOLAX) PACKET    Take 17 g by mouth daily.   SIMVASTATIN (ZOCOR) 40 MG TABLET    Take 40 mg by mouth at bedtime.    TRAMADOL (ULTRAM) 50 MG TABLET    Take 1 tablet (50 mg total) by mouth every 8 (eight) hours as needed for pain.  Modified Medications   No medications on file  Discontinued Medications   POTASSIUM CHLORIDE (K-DUR) 10 MEQ TABLET    Take 10 mEq by mouth daily.       Physical Exam: Filed Vitals:   04/23/13 0812  BP: 160/82  Pulse: 78  Temp: 98.2 F (36.8 C)  TempSrc: Oral  Weight: 174 lb (78.926 kg)  SpO2: 98%   ***(type .physexam)  Labs reviewed: Basic Metabolic Panel:  Recent Labs  16/10/96 0400 05/26/12 0510 05/27/12 0500  06/02/12 0500 12/04/12 0815 01/29/13 1557  NA 138 138 134*  < > 139 145* 143  K 3.8 4.1 3.9  < > 3.9 4.1 3.6  CL 104 103 99  < > 101 100 100  CO2 29 30 30   < > 30 30* 28  GLUCOSE 124* 125* 226*  < > 256* 103* 80  BUN 18 17 16   < > 11 19 16   CREATININE 0.86 0.97 0.82  < > 0.93 1.42* 1.23*  CALCIUM 8.3* 8.3* 8.4  < > 9.1 9.8 9.9  MG 1.8 1.8 1.7  --   --   --   --   PHOS 3.6 3.0 3.0  --   --   --   --   < > = values in this interval not displayed. Liver Function Tests:  Recent Labs  05/26/12 0510 05/28/12 0449 05/29/12 0410  AST 18 29 25   ALT 10 20 20   ALKPHOS 101  115 113  BILITOT 0.5 0.4 0.4  PROT  6.1 6.2 6.4  ALBUMIN 1.9* 2.1* 2.1*   No results found for this basename: LIPASE, AMYLASE,  in the last 8760 hours No results found for this basename: AMMONIA,  in the last 8760 hours CBC:  Recent Labs  05/31/12 0500 06/02/12 0500 08/14/12 1014 12/04/12 0815  WBC 9.4 9.4 7.6 7.2  NEUTROABS  --   --  4.4 3.5  HGB 8.3* 8.7* 12.2 13.6  HCT 24.3* 25.2* 37.2 40.9  MCV 81.0 79.7 77.6* 87  PLT 596* 599* 286  --    Lipid Panel:  Recent Labs  05/19/12 0415 05/26/12 0510  CHOL 111 124  TRIG 72 93   Lab Results  Component Value Date   HGBA1C 5.9* 12/04/2012    Past Procedures:  Assessment/Plan No problem-specific assessment & plan notes found for this encounter.    Labs/tests ordered: Next appt:

## 2013-04-24 LAB — TSH: TSH: 1.54 u[IU]/mL (ref 0.450–4.500)

## 2013-04-24 LAB — HEMOGLOBIN A1C
Est. average glucose Bld gHb Est-mCnc: 128 mg/dL
Hgb A1c MFr Bld: 6.1 % — ABNORMAL HIGH (ref 4.8–5.6)

## 2013-05-18 ENCOUNTER — Other Ambulatory Visit: Payer: Self-pay | Admitting: *Deleted

## 2013-05-18 ENCOUNTER — Telehealth: Payer: Self-pay | Admitting: Internal Medicine

## 2013-05-18 ENCOUNTER — Other Ambulatory Visit: Payer: Self-pay | Admitting: Internal Medicine

## 2013-05-18 NOTE — Telephone Encounter (Signed)
Rx was sent to pharmacy electronically. 

## 2013-05-18 NOTE — Telephone Encounter (Signed)
Message forwarded to J. Elkins, RN.  

## 2013-05-18 NOTE — Telephone Encounter (Signed)
Insurance will not pay unless Dr Rennis Golden approves.  Med is Eliquis  Has to have pre approval.  Please call.

## 2013-05-19 NOTE — Telephone Encounter (Signed)
PA for Eliquis 5mg  BID faxed to [facility] 05/19/13

## 2013-05-19 NOTE — Telephone Encounter (Signed)
Faxed documentation of Eliquis 5mg  BID to [facility] on 05/19/13 - approved thru 05/19/14

## 2013-05-19 NOTE — Telephone Encounter (Signed)
Left VM for patient informing that medication had been approved and should be available for pick-up at pharmacy.

## 2013-05-20 ENCOUNTER — Telehealth: Payer: Self-pay | Admitting: Internal Medicine

## 2013-05-20 MED ORDER — APIXABAN 5 MG PO TABS: ORAL_TABLET | ORAL | Status: DC

## 2013-05-20 NOTE — Telephone Encounter (Signed)
We called in an rx for Eliquis for patient yesterday---this medication is $45.00---she cannot afford this.

## 2013-05-20 NOTE — Telephone Encounter (Signed)
Returned call and pt verified x 2.  Pt informed message received and Dr. Rennis Golden was notified.  Informed per Dr. Rennis Golden he told her that it would cost her about $40 per month.  Pt stated she doesn't remember that, but she can't afford it.  Pt also informed Dr. Rennis Golden advised her only other option is warfarin as the other agents will cost about the same.  Pt verbalized understanding and stated she will stay w/ what she has.  Pt also informed she can get samples as long as they are available and if they aren't available, then she will have to purchase Rx from pharmacy.  Pt verbalized understanding and agreed w/ plan.  Samples left at front desk for pick up.  Pt understands she will need to call back monthly to request samples.

## 2013-05-27 ENCOUNTER — Other Ambulatory Visit: Payer: Self-pay | Admitting: *Deleted

## 2013-05-27 MED ORDER — POTASSIUM CHLORIDE CRYS ER 20 MEQ PO TBCR: EXTENDED_RELEASE_TABLET | ORAL | Status: DC

## 2013-06-01 ENCOUNTER — Other Ambulatory Visit: Payer: Self-pay | Admitting: *Deleted

## 2013-06-01 MED ORDER — SIMVASTATIN 40 MG PO TABS: ORAL_TABLET | ORAL | Status: DC

## 2013-06-23 ENCOUNTER — Telehealth: Payer: Self-pay

## 2013-06-23 DIAGNOSIS — I10 Essential (primary) hypertension: Secondary | ICD-10-CM

## 2013-06-23 DIAGNOSIS — E039 Hypothyroidism, unspecified: Secondary | ICD-10-CM

## 2013-06-23 NOTE — Telephone Encounter (Signed)
Spoke with patient, schedule appointment for next Tuesday

## 2013-06-23 NOTE — Telephone Encounter (Signed)
Message copied by Maurice Small on Tue Jun 23, 2013  3:25 PM ------      Message from: [place], [place] L      Created: Mon Jun 15, 2013  8:45 AM       Pt missed her labs:  cmp and tsh that were to be done.  She did have TSH in 10/14, but needed repeat due to adjustments.            ----- Message -----         From: SYSTEM         Sent: 06/15/2013  12:09 AM           To: Kermit Balo, DO                   ------

## 2013-06-30 ENCOUNTER — Other Ambulatory Visit: Payer: Self-pay | Admitting: *Deleted

## 2013-06-30 ENCOUNTER — Other Ambulatory Visit: Payer: Medicare Other

## 2013-06-30 DIAGNOSIS — E119 Type 2 diabetes mellitus without complications: Secondary | ICD-10-CM

## 2013-06-30 DIAGNOSIS — E039 Hypothyroidism, unspecified: Secondary | ICD-10-CM

## 2013-06-30 DIAGNOSIS — I10 Essential (primary) hypertension: Secondary | ICD-10-CM

## 2013-07-01 ENCOUNTER — Other Ambulatory Visit: Payer: Self-pay | Admitting: *Deleted

## 2013-07-01 LAB — BASIC METABOLIC PANEL
BUN/Creatinine Ratio: 12 (ref 11–26)
BUN: 14 mg/dL (ref 8–27)
CO2: 25 mmol/L (ref 18–29)
Calcium: 9.6 mg/dL (ref 8.6–10.2)
Chloride: 101 mmol/L (ref 97–108)
Creatinine, Ser: 1.2 mg/dL — ABNORMAL HIGH (ref 0.57–1.00)
GFR calc Af Amer: 49 mL/min/{1.73_m2} — ABNORMAL LOW (ref >59)
GFR calc non Af Amer: 43 mL/min/{1.73_m2} — ABNORMAL LOW (ref >59)
Glucose: 90 mg/dL (ref 65–99)
Potassium: 3.8 mmol/L (ref 3.5–5.2)
Sodium: 148 mmol/L — ABNORMAL HIGH (ref 134–144)

## 2013-07-01 LAB — CBC WITH DIFFERENTIAL/PLATELET
Basophils Absolute: 0 10*3/uL (ref 0.0–0.2)
Basos: 0 %
Eos: 1 %
Eosinophils Absolute: 0.1 10*3/uL (ref 0.0–0.4)
HCT: 38.7 % (ref 34.0–46.6)
Hemoglobin: 13.6 g/dL (ref 11.1–15.9)
Immature Grans (Abs): 0 10*3/uL (ref 0.0–0.1)
Immature Granulocytes: 0 %
Lymphocytes Absolute: 3 10*3/uL (ref 0.7–3.1)
Lymphs: 39 %
MCH: 29.2 pg (ref 26.6–33.0)
MCHC: 35.1 g/dL (ref 31.5–35.7)
MCV: 83 fL (ref 79–97)
Monocytes Absolute: 0.6 10*3/uL (ref 0.1–0.9)
Monocytes: 8 %
Neutrophils Absolute: 3.9 10*3/uL (ref 1.4–7.0)
Neutrophils Relative %: 52 %
RBC: 4.65 x10E6/uL (ref 3.77–5.28)
RDW: 15.4 % (ref 12.3–15.4)
WBC: 7.7 10*3/uL (ref 3.4–10.8)

## 2013-07-01 LAB — TSH: TSH: 1.38 u[IU]/mL (ref 0.450–4.500)

## 2013-07-01 MED ORDER — LEVOTHYROXINE SODIUM 50 MCG PO TABS: 50.0000 ug | ORAL_TABLET | Freq: Every day | ORAL | Status: DC

## 2013-07-13 ENCOUNTER — Other Ambulatory Visit: Payer: Self-pay | Admitting: *Deleted

## 2013-07-13 ENCOUNTER — Telehealth: Payer: Self-pay | Admitting: *Deleted

## 2013-07-13 MED ORDER — METFORMIN HCL 500 MG PO TABS: 500.0000 mg | ORAL_TABLET | Freq: Two times a day (BID) | ORAL | Status: DC

## 2013-07-13 NOTE — Telephone Encounter (Signed)
Nicole [name] with [facility] called and stated that patient had a Wellness exam and there is no distress, but the did find 4+ protein in her urine. Glucose was negative. Please Advise.

## 2013-07-13 NOTE — Telephone Encounter (Signed)
Noted.  Pt is diabetic.

## 2013-07-22 ENCOUNTER — Other Ambulatory Visit: Payer: Self-pay | Admitting: Internal Medicine

## 2013-07-31 ENCOUNTER — Ambulatory Visit: Payer: Medicare Other | Admitting: Internal Medicine

## 2013-08-01 ENCOUNTER — Encounter: Payer: Self-pay | Admitting: Internal Medicine

## 2013-08-25 ENCOUNTER — Encounter: Payer: Self-pay | Admitting: Internal Medicine

## 2013-08-25 ENCOUNTER — Ambulatory Visit (INDEPENDENT_AMBULATORY_CARE_PROVIDER_SITE_OTHER): Payer: Medicare Other | Admitting: Internal Medicine

## 2013-08-25 VITALS — BP 122/82 | HR 83 | Ht 68.0 in | Wt 176.5 lb

## 2013-08-25 DIAGNOSIS — M109 Gout, unspecified: Secondary | ICD-10-CM

## 2013-08-25 DIAGNOSIS — I4891 Unspecified atrial fibrillation: Secondary | ICD-10-CM

## 2013-08-25 DIAGNOSIS — I712 Thoracic aortic aneurysm, without rupture, unspecified: Secondary | ICD-10-CM

## 2013-08-25 DIAGNOSIS — I10 Essential (primary) hypertension: Secondary | ICD-10-CM

## 2013-08-25 DIAGNOSIS — I509 Heart failure, unspecified: Secondary | ICD-10-CM

## 2013-08-25 DIAGNOSIS — E119 Type 2 diabetes mellitus without complications: Secondary | ICD-10-CM

## 2013-08-25 NOTE — Progress Notes (Signed)
OFFICE NOTE  Chief Complaint:  Routine office visit  Primary Care Physician: Hollace Kinnier, DO  HPI:  Nicole [name] is a 80 year old female with history of chronic atrial fibrillation and controlled ventricular response. She was previously followed by Dr. Rex Kras, has a history of congestive heart failure with an EF of about 45% and moderate MR in the past. She has a thoracic aneurysm which has been asymptomatic, as well as hypertension, diabetes, dyslipidemia, and recently was admitted for gallbladder surgery which was complicated by bleeding issues. She has been deemed not a good candidate for Coumadin due to a number of issues with compliance by Dr. Rex Kras, and has been on aspirin and Plavix. Unfortunately, with regard to the Plavix she had complicated bleeding postoperatively from her gallbladder surgery and Plavix was discontinued due to this. It turns out that there is little benefit to it additional Plavix on top of aspirin for stroke reduction. As far as her stroke risk is concerned, it is actually fairly high considering she is in chronic atrial fibrillation and does have a history of hypertension and diabetes and is a female with a CHADS2 score of 3, a CHADS VASc score of 3-1/2 or 4.  Based on this, I recommended starting her on Eliquis. She is now taking the medication and has no bleeding issues - her only complaint is cost.   PMHx:  Past Medical History  Diagnosis Date  . Colon cancer 07/1997  . Hypertension   . Diabetes mellitus   . CHF (congestive heart failure)   . AAA (abdominal aortic aneurysm)   . Atrial fibrillation   . Hypercholesteremia   . Kidney stone     renal calculi  . Small bowel obstruction due to adhesions 05/16/2012  . Hypothyroidism   . Arthritis   . Anemia   . Cholelithiasis   . Perforation of bile duct   . Hemorrhage of rectum and anus   . Swelling, mass, or lump in head and neck   . Cervicalgia   . Hypoglycemia, unspecified   . Pain in joint,  lower leg   . Anxiety   . Shortness of breath   . Acute upper respiratory infections of unspecified site   . Depression   . Thoracic aneurysm without mention of rupture   . GERD (gastroesophageal reflux disease)   . Proteinuria   . Other specified cardiac dysrhythmias(427.89)   . Congestive heart failure, unspecified   . Electrolyte and fluid disorders not elsewhere classified   . Other chronic allergic conjunctivitis   . Hypopotassemia   . Chronic systolic heart failure   . Dizziness and giddiness   . Malignant neoplasm of colon, unspecified site   . Other and unspecified hyperlipidemia   . Gout, unspecified   . Atrial fibrillation   . Aortic aneurysm of unspecified site without mention of rupture   . Pain in joint, site unspecified   . Palpitations   . CKD (chronic kidney disease)     Past Surgical History  Procedure Laterality Date  . Hemicolectomy  08/11/1997  . Kidney stone surgery    . Colon surgery      to remove colon ca  . Cholecystectomy  05/14/2012    Procedure: LAPAROSCOPIC CHOLECYSTECTOMY WITH INTRAOPERATIVE CHOLANGIOGRAM;  Surgeon: Haywood Lasso, MD;  Location: [place];  Service: General;  Laterality: N/A;  . Ercp  05/17/2012    Procedure: ENDOSCOPIC RETROGRADE CHOLANGIOPANCREATOGRAPHY (ERCP);  Surgeon: Missy Sabins, MD;  Location: [place];  Service: Gastroenterology;  Laterality: N/A;  . Ercp  08/12/2012    Procedure: ENDOSCOPIC RETROGRADE CHOLANGIOPANCREATOGRAPHY (ERCP);  Surgeon: Barrie Folk, MD;  Location[facility] ENDOSCOPY;  Service: Endoscopy;  Laterality: N/A;  . Transthoracic echocardiogram  11/2009    EF 45-50%, mild-mod AV regurg; mild MR; mod TR; ascending aorta mildly dilated  . Cardiac catheterization  2008    moderate severe pulm HTN; with elevted pulm capillary wedge pressure     FAMHx:  Family History  Problem Relation Age of Onset  . Heart disease Mother   . Stroke Father   . Hypertension Daughter   . Hypertension Son   . Diabetes Sister     . Hypertension Son     SOCHx:   reports that she has never smoked. She has never used smokeless tobacco. She reports that she does not drink alcohol or use illicit drugs.  ALLERGIES:  Allergies  Allergen Reactions  . Iohexol      Code: VOM, Desc: PT REPORTS VOMITING W/ IVP DYE- ARS 12/26/08, Onset Date: [id number]   . Z-Pak [Azithromycin]   . Iodinated Diagnostic Agents Nausea Only    PT HAS HAD SINGULAR INCIDENT OF NAUSEA  W/ IV CONTYRAST INJECTION, NONE IF INJECTED SLOWLY//A.[place]    ROS: A comprehensive review of systems was negative except for: Cardiovascular: positive for palpitations  HOME MEDS: Current Outpatient Prescriptions  Medication Sig Dispense Refill  . allopurinol (ZYLOPRIM) 100 MG tablet take 1 tablet by mouth twice a day for CHRONIC GOUT  60 tablet  1  . apixaban (ELIQUIS) 5 MG TABS tablet Take 1 tablet by mouth every 12 hours.  56 tablet  0  . atorvastatin (LIPITOR) 40 MG tablet Take 40 mg by mouth daily.       Marland Kitchen diltiazem (CARDIZEM CD) 180 MG 24 hr capsule Take 1 capsule (180 mg total) by mouth daily.  30 capsule  6  . furosemide (LASIX) 40 MG tablet take 1 tablet by mouth twice a day  60 tablet  6  . gabapentin (NEURONTIN) 100 MG capsule Take 1 capsule (100 mg total) by mouth 3 (three) times daily.  90 capsule  3  . glipiZIDE (GLUCOTROL) 10 MG tablet Take 10 mg by mouth daily.        Marland Kitchen levothyroxine (SYNTHROID, LEVOTHROID) 50 MCG tablet Take 1 tablet (50 mcg total) by mouth daily.  90 tablet  3  . magnesium oxide (MAG-OX) 400 MG tablet Take one tablet by mouth three times daily for magnesium supplement  90 tablet  5  . metFORMIN (GLUCOPHAGE) 500 MG tablet Take 1 tablet (500 mg total) by mouth 2 (two) times daily with a meal.  60 tablet  5  . polyethylene glycol (MIRALAX / GLYCOLAX) packet Take 17 g by mouth daily.      . potassium chloride SA (KLOR-CON M20) 20 MEQ tablet Take one tablet by mouth once daily  30 tablet  3  . simvastatin (ZOCOR) 40 MG tablet Take  one tablet by mouth once daily  30 tablet  5  . traMADol (ULTRAM) 50 MG tablet Take 1 tablet (50 mg total) by mouth every 8 (eight) hours as needed for pain.  30 tablet  0   No current facility-administered medications for this visit.    LABS/IMAGING: No results found for this or any previous visit (from the past 48 hour(s)). No results found.  VITALS: BP 122/82  Pulse 83  Ht 5\' 8"  (1.727 m)  Wt 176 lb 8 oz (80.06 kg)  BMI 26.84 kg/m2  EXAM: General appearance: alert and no distress Neck: no adenopathy, no carotid bruit, no JVD, supple, symmetrical, trachea midline and thyroid not enlarged, symmetric, no tenderness/mass/nodules Lungs: clear to auscultation bilaterally Heart: regular rate and rhythm, S1, S2 normal, no murmur, click, rub or gallop Abdomen: soft, non-tender; bowel sounds normal; no masses,  no organomegaly Extremities: extremities normal, atraumatic, no cyanosis or edema Pulses: 2+ and symmetric Skin: Skin color, texture, turgor normal. No rashes or lesions Neurologic: Grossly normal  EKG: Coarse atrial fibrillation at 83, incomplete right bundle branch block  ASSESSMENT: 1. Chronic atrial fibrillation on Eliquis 2. History of congestive heart failure, EF of 45% 3. Hypertension 4. History of thoracic aneurysm 5. Diabetes 6. Dyslipidemia 7. Chronic kidney disease  PLAN: 1.   Ms. Leeth has chronic atrial fibrillation and is tolerating Eliquis. Her blood pressure is well controlled and she is generally asymptomatic. Her last EF was 45% in 2011 and should be repeated in the near future. For now we'll continue her current medications and plan to see her back in 6 months.  Pixie Casino, MD, [facility] Attending Cardiologist [facility] 08/25/2013, 4:38 PM

## 2013-08-25 NOTE — Patient Instructions (Signed)
Your physician wants you to follow-up in:  6 months. You will receive a reminder letter in the mail two months in advance. If you don't receive a letter, please call our office to schedule the follow-up appointment.   

## 2013-09-03 ENCOUNTER — Encounter: Payer: Self-pay | Admitting: Internal Medicine

## 2013-09-07 ENCOUNTER — Ambulatory Visit (INDEPENDENT_AMBULATORY_CARE_PROVIDER_SITE_OTHER): Payer: Medicare Other | Admitting: Internal Medicine

## 2013-09-07 ENCOUNTER — Encounter: Payer: Self-pay | Admitting: Internal Medicine

## 2013-09-07 VITALS — BP 136/80 | HR 88 | Resp 10 | Wt 176.0 lb

## 2013-09-07 DIAGNOSIS — I10 Essential (primary) hypertension: Secondary | ICD-10-CM

## 2013-09-07 DIAGNOSIS — K5909 Other constipation: Secondary | ICD-10-CM

## 2013-09-07 DIAGNOSIS — E039 Hypothyroidism, unspecified: Secondary | ICD-10-CM

## 2013-09-07 DIAGNOSIS — N058 Unspecified nephritic syndrome with other morphologic changes: Secondary | ICD-10-CM

## 2013-09-07 DIAGNOSIS — E1121 Type 2 diabetes mellitus with diabetic nephropathy: Secondary | ICD-10-CM | POA: Insufficient documentation

## 2013-09-07 DIAGNOSIS — E1129 Type 2 diabetes mellitus with other diabetic kidney complication: Secondary | ICD-10-CM

## 2013-09-07 DIAGNOSIS — D649 Anemia, unspecified: Secondary | ICD-10-CM

## 2013-09-07 DIAGNOSIS — K5900 Constipation, unspecified: Secondary | ICD-10-CM

## 2013-09-07 DIAGNOSIS — G5600 Carpal tunnel syndrome, unspecified upper limb: Secondary | ICD-10-CM

## 2013-09-07 DIAGNOSIS — E1149 Type 2 diabetes mellitus with other diabetic neurological complication: Secondary | ICD-10-CM

## 2013-09-07 DIAGNOSIS — I4891 Unspecified atrial fibrillation: Secondary | ICD-10-CM

## 2013-09-07 NOTE — Progress Notes (Signed)
Patient ID: Nicole [name], female   DOB: 08-09-1930, 80 y.o.   MRN: [id number]   Location:  [facility] [place]  Code Status: DNR-reviewed today--pt clearly does not want to ever be put on machines and understands that her prognosis would be quite poor if she had a cardiac or respiratory arrest  Allergies  Allergen Reactions  . Iohexol      Code: VOM, Desc: PT REPORTS VOMITING W/ IVP DYE- ARS 12/26/08, Onset Date: [id number]   . Z-Pak [Azithromycin]   . Iodinated Diagnostic Agents Nausea Only    PT HAS HAD SINGULAR INCIDENT OF NAUSEA  W/ IV CONTYRAST INJECTION, NONE IF INJECTED SLOWLY//A[facility]    Chief Complaint  Patient presents with  . Medical Managment of Chronic Issues    3 month follow-up, ongoing numbness and tingling in left hand (off/on)   . Hand Problem    Examine thumb on right hand     HPI: Patient is a 80 y.o. black female seen in the office today for medical mgt chronic diseases.  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  All of the affected fingers--1st three on left and 1st two on right are the ones with numbness and tingling--comes and goes--no clear correlation with activity.  Is wrapping left wrist.  Saw Dr. Eddie Dibbles at orthopedics.  He said she might need CTS if it continues to be a problem--is trying to avoid any surgery.  Takes tramadol at least once a day.    Feels like she never fully recovered from her gallbladder surgery--bowels never returned to normal. Uses some miralax and magnesium with benefit for constipation. See ros.  Review of Systems:  Review of Systems  Constitutional: Negative for fever and chills.  HENT: Negative for congestion.   Eyes: Negative for blurred vision.  Cardiovascular: Positive for palpitations. Negative for chest pain.       Says not really--has palpitations at times; not different--se[facility]ett at [facility]  Gastrointestinal: Negative for heartburn.       Still feels bloated and says  bowels move abnormally--is constipated--seems like stool comes out in sections;  Takes medicine.  No diarrhea.  No abdominal pain  Genitourinary: Negative for dysuria.  Musculoskeletal: Negative for falls.  Skin: Negative for rash.  Neurological: Negative for dizziness, loss of consciousness, weakness and headaches.  Endo/Heme/Allergies: Does not bruise/bleed easily.  Psychiatric/Behavioral: Negative for depression and memory loss. The patient does not have insomnia.      Past Medical History  Diagnosis Date  . Colon cancer 07/1997  . Hypertension   . Diabetes mellitus   . CHF (congestive heart failure)   . AAA (abdominal aortic aneurysm)   . Atrial fibrillation   . Hypercholesteremia   . Kidney stone     renal calculi  . Small bowel obstruction due to adhesions 05/16/2012  . Hypothyroidism   . Arthritis   . Anemia   . Cholelithiasis   . Perforation of bile duct   . Hemorrhage of rectum and anus   . Swelling, mass, or lump in head and neck   . Cervicalgia   . Hypoglycemia, unspecified   . Pain in joint, lower leg   . Anxiety   . Shortness of breath   . Acute upper respiratory infections of unspecified site   . Depression   . Thoracic aneurysm without mention of rupture   . GERD (gastroesophageal reflux disease)   . Proteinuria   . Other specified cardiac dysrhythmias(427.89)   .  Congestive heart failure, unspecified   . Electrolyte and fluid disorders not elsewhere classified   . Other chronic allergic conjunctivitis   . Hypopotassemia   . Chronic systolic heart failure   . Dizziness and giddiness   . Malignant neoplasm of colon, unspecified site   . Other and unspecified hyperlipidemia   . Gout, unspecified   . Atrial fibrillation   . Aortic aneurysm of unspecified site without mention of rupture   . Pain in joint, site unspecified   . Palpitations   . CKD (chronic kidney disease)     Past Surgical History  Procedure Laterality Date  . Hemicolectomy   08/11/1997  . Kidney stone surgery    . Colon surgery      to remove colon ca  . Cholecystectomy  05/14/2012    Procedure: LAPAROSCOPIC CHOLECYSTECTOMY WITH INTRAOPERATIVE CHOLANGIOGRAM;  Surgeon: Haywood Lasso, MD;  Location: [place];  Service: General;  Laterality: N/A;  . Ercp  05/17/2012    Procedure: ENDOSCOPIC RETROGRADE CHOLANGIOPANCREATOGRAPHY (ERCP);  Surgeon: Missy Sabins, MD;  Location: [place];  Service: Gastroenterology;  Laterality: N/A;  . Ercp  08/12/2012    Procedure: ENDOSCOPIC RETROGRADE CHOLANGIOPANCREATOGRAPHY (ERCP);  Surgeon: Missy Sabins, MD;  Location: [facility] ENDOSCOPY;  Service: Endoscopy;  Laterality: N/A;  . Transthoracic echocardiogram  11/2009    EF 45-50%, mild-mod AV regurg; mild MR; mod TR; ascending aorta mildly dilated  . Cardiac catheterization  2008    moderate severe pulm HTN; with elevted pulm capillary wedge pressure     Social History:   reports that she has never smoked. She has never used smokeless tobacco. She reports that she does not drink alcohol or use illicit drugs.  Family History  Problem Relation Age of Onset  . Heart disease Mother   . Stroke Father   . Hypertension Daughter   . Hypertension Son   . Diabetes Sister   . Hypertension Son     Medications: Patient's Medications  New Prescriptions   No medications on file  Previous Medications   ALLOPURINOL (ZYLOPRIM) 100 MG TABLET    take 1 tablet by mouth twice a day for CHRONIC GOUT   APIXABAN (ELIQUIS) 5 MG TABS TABLET    Take 1 tablet by mouth every 12 hours.   ATORVASTATIN (LIPITOR) 40 MG TABLET    Take 40 mg by mouth daily.    DILTIAZEM (CARDIZEM CD) 180 MG 24 HR CAPSULE    Take 1 capsule (180 mg total) by mouth daily.   FUROSEMIDE (LASIX) 40 MG TABLET    take 1 tablet by mouth twice a day   GABAPENTIN (NEURONTIN) 100 MG CAPSULE    Take 1 capsule (100 mg total) by mouth 3 (three) times daily.   GLIPIZIDE (GLUCOTROL) 10 MG TABLET    Take 10 mg by mouth daily.     LEVOTHYROXINE  (SYNTHROID, LEVOTHROID) 50 MCG TABLET    Take 1 tablet (50 mcg total) by mouth daily.   MAGNESIUM OXIDE (MAG-OX) 400 MG TABLET    Take one tablet by mouth three times daily for magnesium supplement   METFORMIN (GLUCOPHAGE) 500 MG TABLET    Take 1 tablet (500 mg total) by mouth 2 (two) times daily with a meal.   POLYETHYLENE GLYCOL (MIRALAX / GLYCOLAX) PACKET    Take 17 g by mouth daily.   POTASSIUM CHLORIDE SA (KLOR-CON M20) 20 MEQ TABLET    Take one tablet by mouth once daily   SIMVASTATIN (ZOCOR) 40 MG TABLET  Take one tablet by mouth once daily   TRAMADOL (ULTRAM) 50 MG TABLET    Take 1 tablet (50 mg total) by mouth every 8 (eight) hours as needed for pain.  Modified Medications   No medications on file  Discontinued Medications   No medications on file     Physical Exam: Filed Vitals:   09/07/13 1050  BP: 136/80  Pulse: 88  Resp: 10  Weight: 176 lb (79.833 kg)  Physical Exam  Constitutional: She is oriented to person, place, and time. She appears well-developed and well-nourished. No distress.  HENT:  Head: Normocephalic and atraumatic.  Neck: No JVD present.  Cardiovascular: Normal heart sounds and intact distal pulses.   irreg irreg  Pulmonary/Chest: Effort normal and breath sounds normal. She has no rales.  Abdominal: Soft. Bowel sounds are normal. She exhibits no distension and no mass. There is no tenderness.  Musculoskeletal: Normal range of motion.  Neurological: She is alert and oriented to person, place, and time.  Skin: Skin is warm and dry.  Fingers of left hand tips have lost the creases (from burning them with curling iron)  Psychiatric: She has a normal mood and affect.    Labs reviewed: Basic Metabolic Panel:  Recent Labs  12/04/12 0815 01/29/13 1557 04/23/13 0916 06/30/13 0928  NA 145* 143  --  148*  K 4.1 3.6  --  3.8  CL 100 100  --  101  CO2 30* 28  --  25  GLUCOSE 103* 80  --  90  BUN 19 16  --  14  CREATININE 1.42* 1.23*  --  1.20*    CALCIUM 9.8 9.9  --  9.6  TSH  --   --  1.540 1.380  CBC:  Recent Labs  12/04/12 0815 06/30/13 0928  WBC 7.2 7.7  NEUTROABS 3.5 3.9  HGB 13.6 13.6  HCT 40.9 38.7  MCV 87 83   Lipid Panel: No results found for this basename: CHOL, HDL, LDLCALC, TRIG, CHOLHDL, LDLDIRECT,  in the last 8760 hours Lab Results  Component Value Date   HGBA1C 6.1* 04/23/2013    Assessment/Plan 1. Diabetic nephropathy - Comprehensive metabolic panel; Future - Hemoglobin A1c; Future - remains hydrated  2. 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 Anemia - CBC with Differential; Future  4. Hypothyroidism - TSH; Future -cont synthroid at current dose--is clinically euthyroid  5. Type II or unspecified type diabetes mellitus with neurological manifestations, not stated as uncontrolled - stable, weight about the same -f/u labs -Hemoglobin A1c; Future - Lipid panel; Future  6. Chronic constipation -has regimen that is working for her, but feels bowels just never returned to normal since her gallbladder surgery  7. Atrial fibrillation with RVR -cont eliquis and rate control, has periods of palpitations that she notices, but asymptomatic now -keep f/u with Dr. Debara Pickett  8. HTN (hypertension) -cont current therapy and cont to monitor  Labs/tests ordered:   Orders Placed This Encounter  Procedures  . CBC with Differential    Standing Status: Future     Number of Occurrences:      Standing Expiration Date: 03/07/2014  . Comprehensive metabolic panel    Standing Status: Future     Number of Occurrences:      Standing Expiration Date: 03/07/2014  . Hemoglobin A1c  Standing Status: Future     Number of Occurrences:      Standing Expiration Date: 03/07/2014  . Lipid panel    Standing Status: Future     Number of Occurrences:       Standing Expiration Date: 03/07/2014  . TSH    Standing Status: Future     Number of Occurrences:      Standing Expiration Date: 03/07/2014  . DNR (Do Not Resuscitate)    Discussed on 09/07/13    Order Specific Question:  Maintain current active treatments    Answer:  Yes    Order Specific Question:  Do not initiate new interventions    Answer:  Yes    Next appt:  3 mos

## 2013-09-15 ENCOUNTER — Telehealth: Payer: Self-pay

## 2013-09-15 NOTE — Telephone Encounter (Signed)
Message left on triage VM, patient would like a return call to discuss blood sugar machine.  I returned call, left message on VM for patient to return call when available

## 2013-09-18 NOTE — Telephone Encounter (Signed)
I spoke with patient and she states that she received a blood sugar machine from [facility] indicating check blood sugar four times daily. I informed patient that the most she should check blood sugar is once daily, patient is NOT insulin dependant and insurance will not allow testing more than once daily. Patient verbalized understanding

## 2013-09-21 ENCOUNTER — Telehealth: Payer: Self-pay | Admitting: Internal Medicine

## 2013-09-21 MED ORDER — APIXABAN 5 MG PO TABS: ORAL_TABLET | ORAL | Status: DC

## 2013-09-21 NOTE — Telephone Encounter (Signed)
Would like some samples of Eliquis please.

## 2013-09-21 NOTE — Telephone Encounter (Signed)
Returned call and pt informed samples left at front desk.  Pt verbalized understanding and agreed w/ plan.   3 week supply.

## 2013-09-25 ENCOUNTER — Other Ambulatory Visit: Payer: Self-pay | Admitting: *Deleted

## 2013-09-25 MED ORDER — POTASSIUM CHLORIDE CRYS ER 20 MEQ PO TBCR: EXTENDED_RELEASE_TABLET | ORAL | Status: DC

## 2013-10-05 ENCOUNTER — Other Ambulatory Visit: Payer: Self-pay | Admitting: *Deleted

## 2013-10-05 MED ORDER — GLIPIZIDE 10 MG PO TABS: ORAL_TABLET | ORAL | Status: DC

## 2013-10-05 NOTE — Telephone Encounter (Signed)
[place]

## 2013-10-26 ENCOUNTER — Telehealth: Payer: Self-pay

## 2013-10-26 NOTE — Telephone Encounter (Signed)
Spoke with patient, patient did not request supplies from this company, this was noted and faxed back to [phone]

## 2013-10-26 NOTE — Telephone Encounter (Signed)
Paperwork received from [employer] for DM testing supplies, I called patient and Left message on voicemail for patient to return call when available to discuss if she requested.

## 2013-11-25 ENCOUNTER — Telehealth: Payer: Self-pay | Admitting: Internal Medicine

## 2013-11-25 ENCOUNTER — Other Ambulatory Visit: Payer: Self-pay | Admitting: Internal Medicine

## 2013-11-25 MED ORDER — APIXABAN 5 MG PO TABS: ORAL_TABLET | ORAL | Status: DC

## 2013-11-25 NOTE — Telephone Encounter (Signed)
Returned call and pt informed samples left at front desk.  Pt verbalized understanding.    Pt stated it cost $150 at the pharmacy and she cannot afford it.  Informed Dr. Debara Pickett will be notified.  Pt verbalized understanding and agreed w/ plan.   Message forwarded to Dr. Josepha Pigg, RN.

## 2013-11-25 NOTE — Telephone Encounter (Signed)
Need some samples of Eliquis please.

## 2013-11-25 NOTE — Telephone Encounter (Signed)
Rx was sent to pharmacy electronically. 

## 2013-11-25 NOTE — Telephone Encounter (Signed)
Spoke with patient, she has some prescription part D coverage (hence the $150 instead of $350/month), so I will call in rx for xarelto to her pharmacy.  I have asked that she call the pharmacy tomorrow to determine the price, but do not pick it up at this time.  If it is also cost prohibitive, we will have a few weeks to look for alternatives for her.  Pt voiced understanding

## 2013-11-25 NOTE — Telephone Encounter (Signed)
Maybe she can get Sayvasa if she doesn't have drug coverage? Erasmo Downer, could you check on options for her since her Eliquis is too expensive?  -Dr. Debara Pickett

## 2013-11-27 ENCOUNTER — Telehealth: Payer: Self-pay | Admitting: Pharmacist Clinician (PhC)/ Clinical Pharmacy Specialist

## 2013-11-27 MED ORDER — RIVAROXABAN 20 MG PO TABS: 20.0000 mg | ORAL_TABLET | Freq: Every day | ORAL | Status: DC

## 2013-11-27 NOTE — Telephone Encounter (Signed)
Sent Xarelto to pharmacy - trying to determine if cheaper than Eliquis

## 2013-12-03 ENCOUNTER — Other Ambulatory Visit: Payer: Medicare Other

## 2013-12-03 ENCOUNTER — Other Ambulatory Visit: Payer: Self-pay | Admitting: *Deleted

## 2013-12-03 DIAGNOSIS — D649 Anemia, unspecified: Secondary | ICD-10-CM

## 2013-12-03 DIAGNOSIS — E1149 Type 2 diabetes mellitus with other diabetic neurological complication: Secondary | ICD-10-CM

## 2013-12-03 DIAGNOSIS — E1121 Type 2 diabetes mellitus with diabetic nephropathy: Secondary | ICD-10-CM

## 2013-12-03 DIAGNOSIS — E039 Hypothyroidism, unspecified: Secondary | ICD-10-CM

## 2013-12-03 NOTE — Telephone Encounter (Signed)
Faxed Prior Authorization for xarelto 20mg  PO QD

## 2013-12-04 LAB — CBC WITH DIFFERENTIAL/PLATELET
Basophils Absolute: 0 10*3/uL (ref 0.0–0.2)
Basos: 1 %
Eos: 2 %
Eosinophils Absolute: 0.1 10*3/uL (ref 0.0–0.4)
HCT: 41.3 % (ref 34.0–46.6)
Hemoglobin: 13.5 g/dL (ref 11.1–15.9)
Immature Grans (Abs): 0 10*3/uL (ref 0.0–0.1)
Immature Granulocytes: 0 %
Lymphocytes Absolute: 1.9 10*3/uL (ref 0.7–3.1)
Lymphs: 31 %
MCH: 28.8 pg (ref 26.6–33.0)
MCHC: 32.7 g/dL (ref 31.5–35.7)
MCV: 88 fL (ref 79–97)
Monocytes Absolute: 0.7 10*3/uL (ref 0.1–0.9)
Monocytes: 11 %
Neutrophils Absolute: 3.5 10*3/uL (ref 1.4–7.0)
Neutrophils Relative %: 55 %
RBC: 4.68 x10E6/uL (ref 3.77–5.28)
RDW: 16.5 % — ABNORMAL HIGH (ref 12.3–15.4)
WBC: 6.2 10*3/uL (ref 3.4–10.8)

## 2013-12-04 LAB — COMPREHENSIVE METABOLIC PANEL
ALT: 8 IU/L (ref 0–32)
AST: 18 IU/L (ref 0–40)
Albumin/Globulin Ratio: 1.6 (ref 1.1–2.5)
Albumin: 4.3 g/dL (ref 3.5–4.7)
Alkaline Phosphatase: 81 IU/L (ref 39–117)
BUN/Creatinine Ratio: 13 (ref 11–26)
BUN: 19 mg/dL (ref 8–27)
CO2: 29 mmol/L (ref 18–29)
Calcium: 9.5 mg/dL (ref 8.7–10.3)
Chloride: 101 mmol/L (ref 97–108)
Creatinine, Ser: 1.45 mg/dL — ABNORMAL HIGH (ref 0.57–1.00)
GFR calc Af Amer: 39 mL/min/{1.73_m2} — ABNORMAL LOW (ref >59)
GFR calc non Af Amer: 34 mL/min/{1.73_m2} — ABNORMAL LOW (ref >59)
Globulin, Total: 2.7 g/dL (ref 1.5–4.5)
Glucose: 86 mg/dL (ref 65–99)
Potassium: 4.3 mmol/L (ref 3.5–5.2)
Sodium: 142 mmol/L (ref 134–144)
Total Bilirubin: 0.5 mg/dL (ref 0.0–1.2)
Total Protein: 7 g/dL (ref 6.0–8.5)

## 2013-12-04 LAB — LIPID PANEL
Chol/HDL Ratio: 3.1 ratio units (ref 0.0–4.4)
Cholesterol, Total: 184 mg/dL (ref 100–199)
HDL: 59 mg/dL (ref >39)
LDL Calculated: 103 mg/dL — ABNORMAL HIGH (ref 0–99)
Triglycerides: 109 mg/dL (ref 0–149)
VLDL Cholesterol Cal: 22 mg/dL (ref 5–40)

## 2013-12-04 LAB — TSH: TSH: 3.62 u[IU]/mL (ref 0.450–4.500)

## 2013-12-04 LAB — HEMOGLOBIN A1C
Est. average glucose Bld gHb Est-mCnc: 123 mg/dL
Hgb A1c MFr Bld: 5.9 % — ABNORMAL HIGH (ref 4.8–5.6)

## 2013-12-07 ENCOUNTER — Ambulatory Visit (INDEPENDENT_AMBULATORY_CARE_PROVIDER_SITE_OTHER): Payer: Medicare Other | Admitting: Internal Medicine

## 2013-12-07 ENCOUNTER — Encounter: Payer: Self-pay | Admitting: Internal Medicine

## 2013-12-07 VITALS — BP 170/90 | HR 97 | Temp 97.6°F | Wt 177.4 lb

## 2013-12-07 DIAGNOSIS — K5900 Constipation, unspecified: Secondary | ICD-10-CM

## 2013-12-07 DIAGNOSIS — I4891 Unspecified atrial fibrillation: Secondary | ICD-10-CM

## 2013-12-07 DIAGNOSIS — K5909 Other constipation: Secondary | ICD-10-CM

## 2013-12-07 DIAGNOSIS — E1129 Type 2 diabetes mellitus with other diabetic kidney complication: Secondary | ICD-10-CM

## 2013-12-07 DIAGNOSIS — E1121 Type 2 diabetes mellitus with diabetic nephropathy: Secondary | ICD-10-CM

## 2013-12-07 DIAGNOSIS — N058 Unspecified nephritic syndrome with other morphologic changes: Secondary | ICD-10-CM

## 2013-12-07 DIAGNOSIS — Z23 Encounter for immunization: Secondary | ICD-10-CM

## 2013-12-07 DIAGNOSIS — E039 Hypothyroidism, unspecified: Secondary | ICD-10-CM

## 2013-12-07 DIAGNOSIS — E1149 Type 2 diabetes mellitus with other diabetic neurological complication: Secondary | ICD-10-CM

## 2013-12-07 MED ORDER — ZOSTER VACCINE LIVE 19400 UNT/0.65ML ~~LOC~~ SOLR: 0.6500 mL | Freq: Once | SUBCUTANEOUS | Status: DC

## 2013-12-07 MED ORDER — TETANUS-DIPHTH-ACELL PERTUSSIS 5-2-15.5 LF-MCG/0.5 IM SUSP: 0.5000 mL | Freq: Once | INTRAMUSCULAR | Status: DC

## 2013-12-07 MED ORDER — GABAPENTIN 100 MG PO CAPS: 100.0000 mg | ORAL_CAPSULE | Freq: Three times a day (TID) | ORAL | Status: DC

## 2013-12-07 NOTE — Patient Instructions (Addendum)
Stop glipizide--your sugar is at goal Restart gabapentin and see if it helps with the itching sensation you have.  If not, you may stop again after one week.

## 2013-12-07 NOTE — Progress Notes (Signed)
Patient ID: Nicole [name], female   DOB: 1931/03/11, 80 y.o.   MRN: [id number]   Location:  [facility]  Code Status: DNR  Allergies  Allergen Reactions  . Iohexol      Code: VOM, Desc: PT REPORTS VOMITING W/ IVP DYE- ARS 12/26/08, Onset Date: [id number]   . Z-Pak [Azithromycin]   . Iodinated Diagnostic Agents Nausea Only    PT HAS HAD SINGULAR INCIDENT OF NAUSEA  W/ IV CONTYRAST INJECTION, NONE IF INJECTED SLOWLY//A.[facility]    Chief Complaint  Patient presents with  . Medical Management of Chronic Issues    3 month f/u, discuss labs (printed) & [employer] form  . Immunizations    shingles vaccine printed.  Marland Kitchen other    itching in feet and changes locations.    HPI: Patient is a 80 y.o. black female seen in the office today for medical mgt of chronic diseases.  She has an optum form to complete.  She agrees to get a shingles vaccine at the pharmacy.    Not much new since last visit.  Had episode of itching in her feet.  Puts ointment on her feet and it stops after 30 mins which felt like 3 hours.  Doesn't come very often.  Feels like her head is itching more of the time.  Similar sensation of a deep itch.  Not burning or tingling.  Has to dig at the itching.    Turns out she has stopped her gabapentin, but I did not instruct her to that I can find.    Review of Systems:  Review of Systems  Constitutional: Negative for fever and weight loss.  HENT: Negative for congestion and hearing loss.   Eyes: Negative for blurred vision.  Respiratory: Negative for shortness of breath.   Cardiovascular: Positive for palpitations and leg swelling. Negative for chest pain.       Some chronically, but not bothersome to her; varicose veins from standing as hairdresser  Gastrointestinal: Negative for diarrhea, constipation, blood in stool and melena.       Bowels still act unusual--has to go two times in a row--unchanged since surgery  Genitourinary:  Positive for urgency. Negative for dysuria, frequency and hematuria.  Musculoskeletal: Negative for falls, joint pain and myalgias.       Is a little unsteady at times--seems to be when gets up to go to the bathroom in the morning or overnight  Skin: Positive for itching. Negative for rash.  Neurological: Negative for weakness.     Past Medical History  Diagnosis Date  . Colon cancer 07/1997  . Hypertension   . Diabetes mellitus   . CHF (congestive heart failure)   . AAA (abdominal aortic aneurysm)   . Atrial fibrillation   . Hypercholesteremia   . Kidney stone     renal calculi  . Small bowel obstruction due to adhesions 05/16/2012  . Hypothyroidism   . Arthritis   . Anemia   . Cholelithiasis   . Perforation of bile duct   . Hemorrhage of rectum and anus   . Swelling, mass, or lump in head and neck   . Cervicalgia   . Hypoglycemia, unspecified   . Pain in joint, lower leg   . Anxiety   . Shortness of breath   . Acute upper respiratory infections of unspecified site   . Depression   . Thoracic aneurysm without mention of rupture   . GERD (gastroesophageal reflux disease)   .  Proteinuria   . Other specified cardiac dysrhythmias(427.89)   . Congestive heart failure, unspecified   . Electrolyte and fluid disorders not elsewhere classified   . Other chronic allergic conjunctivitis   . Hypopotassemia   . Chronic systolic heart failure   . Dizziness and giddiness   . Malignant neoplasm of colon, unspecified site   . Other and unspecified hyperlipidemia   . Gout, unspecified   . Atrial fibrillation   . Aortic aneurysm of unspecified site without mention of rupture   . Pain in joint, site unspecified   . Palpitations   . CKD (chronic kidney disease)     Past Surgical History  Procedure Laterality Date  . Hemicolectomy  08/11/1997  . Kidney stone surgery    . Colon surgery      to remove colon ca  . Cholecystectomy  05/14/2012    Procedure: LAPAROSCOPIC  CHOLECYSTECTOMY WITH INTRAOPERATIVE CHOLANGIOGRAM;  Surgeon: Haywood Lasso, MD;  Location: [place];  Service: General;  Laterality: N/A;  . Ercp  05/17/2012    Procedure: ENDOSCOPIC RETROGRADE CHOLANGIOPANCREATOGRAPHY (ERCP);  Surgeon: Missy Sabins, MD;  Location: [place];  Service: Gastroenterology;  Laterality: N/A;  . Ercp  08/12/2012    Procedure: ENDOSCOPIC RETROGRADE CHOLANGIOPANCREATOGRAPHY (ERCP);  Surgeon: Missy Sabins, MD;  Location: [facility] ENDOSCOPY;  Service: Endoscopy;  Laterality: N/A;  . Transthoracic echocardiogram  11/2009    EF 45-50%, mild-mod AV regurg; mild MR; mod TR; ascending aorta mildly dilated  . Cardiac catheterization  2008    moderate severe pulm HTN; with elevted pulm capillary wedge pressure     Social History:   reports that she has never smoked. She has never used smokeless tobacco. She reports that she does not drink alcohol or use illicit drugs.  Family History  Problem Relation Age of Onset  . Heart disease Mother   . Stroke Father   . Hypertension Daughter   . Hypertension Son   . Diabetes Sister   . Hypertension Son     Medications: Patient's Medications  New Prescriptions   No medications on file  Previous Medications   ALLOPURINOL (ZYLOPRIM) 100 MG TABLET    take 1 tablet by mouth twice a day for CHRONIC GOUT   APIXABAN (ELIQUIS) 5 MG TABS TABLET    Take 1 tablet by mouth every 12 hours.   DILTIAZEM (CARTIA XT) 180 MG 24 HR CAPSULE    Take 1 capsule (180 mg total) by mouth daily.   FUROSEMIDE (LASIX) 40 MG TABLET    take 1 tablet by mouth twice a day   GLIPIZIDE (GLUCOTROL) 10 MG TABLET    Take one tablet by mouth once daily to control blood sugar   LEVOTHYROXINE (SYNTHROID, LEVOTHROID) 50 MCG TABLET    Take 1 tablet (50 mcg total) by mouth daily.   MAGNESIUM OXIDE (MAG-OX) 400 MG TABLET    Take one tablet by mouth three times daily for magnesium supplement   METFORMIN (GLUCOPHAGE) 500 MG TABLET    Take 1 tablet (500 mg total) by mouth 2 (two)  times daily with a meal.   POLYETHYLENE GLYCOL (MIRALAX / GLYCOLAX) PACKET    Take 17 g by mouth daily.   POTASSIUM CHLORIDE SA (KLOR-CON M20) 20 MEQ TABLET    Take one tablet by mouth once daily   RIVAROXABAN (XARELTO) 20 MG TABS TABLET    Take 1 tablet (20 mg total) by mouth daily with supper.   SIMVASTATIN (ZOCOR) 40 MG TABLET  Take one tablet by mouth once daily  Modified Medications   Modified Medication Previous Medication   TDAP (ADACEL) 11-21-13.5 LF-MCG/0.5 INJECTION Tdap (ADACEL) 11-21-13.5 LF-MCG/0.5 injection      Inject 0.5 mLs into the muscle once.    Inject 0.5 mLs into the muscle once.   ZOSTER VACCINE LIVE, PF, (ZOSTAVAX) 28413 UNT/0.65ML INJECTION zoster vaccine live, PF, (ZOSTAVAX) 24401 UNT/0.65ML injection      Inject 19,400 Units into the skin once.    Inject 0.65 mLs into the skin once.  Discontinued Medications   ATORVASTATIN (LIPITOR) 40 MG TABLET    Take 40 mg by mouth daily.    GABAPENTIN (NEURONTIN) 100 MG CAPSULE    Take 1 capsule (100 mg total) by mouth 3 (three) times daily.   TRAMADOL (ULTRAM) 50 MG TABLET    Take 1 tablet (50 mg total) by mouth every 8 (eight) hours as needed for pain.     Physical Exam: Filed Vitals:   12/07/13 0819  BP: 170/90  Pulse: 97  Temp: 97.6 F (36.4 C)  TempSrc: Oral  Weight: 177 lb 6.4 oz (80.468 kg)  SpO2: 99%  Physical Exam  Constitutional: She is oriented to person, place, and time. She appears well-developed and well-nourished. No distress.  HENT:  Head: Normocephalic and atraumatic.  Cardiovascular:  irreg irreg, no m/g/r  Pulmonary/Chest: Effort normal and breath sounds normal. No respiratory distress.  Abdominal: Soft. Bowel sounds are normal. She exhibits no distension and no mass. There is no tenderness.  Musculoskeletal: Normal range of motion. She exhibits no edema and no tenderness.  Neurological: She is alert and oriented to person, place, and time.  Skin: Skin is warm and dry.  Psychiatric: She has a  normal mood and affect.    Labs reviewed: Basic Metabolic Panel:  Recent Labs  01/29/13 1557 04/23/13 0916 06/30/13 0928 12/03/13 0833  NA 143  --  148* 142  K 3.6  --  3.8 4.3  CL 100  --  101 101  CO2 28  --  25 29  GLUCOSE 80  --  90 86  BUN 16  --  14 19  CREATININE 1.23*  --  1.20* 1.45*  CALCIUM 9.9  --  9.6 9.5  TSH  --  1.540 1.380 3.620   Liver Function Tests:  Recent Labs  12/03/13 0833  AST 18  ALT 8  ALKPHOS 81  BILITOT 0.5  PROT 7.0  CBC:  Recent Labs  06/30/13 0928 12/03/13 0833  WBC 7.7 6.2  NEUTROABS 3.9 3.5  HGB 13.6 13.5  HCT 38.7 41.3  MCV 83 88   Lipid Panel:  Recent Labs  12/03/13 0833  HDL 59  LDLCALC 103*  TRIG 109  CHOLHDL 3.1   Lab Results  Component Value Date   HGBA1C 5.9* 12/03/2013    Assessment/Plan 1. Type II or unspecified type diabetes mellitus with neurological manifestations, not stated as uncontrolled - well controlled -will stop glipizide and continue metformin - Comprehensive metabolic panel; Future - Hemoglobin A1c; Future  2. Diabetic nephropathy - renal function slightly worse this time vs. Last, but suspect this was related to hydration  3. Atrial fibrillation with RVR - will keep on eliquis 5mg  po bid for now--we gave her a month's supply of samples -if we do not have any when she is near out, will switch her to xarelto as planned by cardiology--explicit instructions written out for patient on her AVS - CBC With differential/Platelet; Future  4. Hypothyroidism-TSH  wnl, no changes needed to regimen  5. Chronic constipation -unchanged since her surgery -cont current regimen  6. Need for prophylactic vaccination with combined diphtheria-tetanus-pertussis (DTP) vaccine -will need to get at pharmacy next time - Tdap (ADACEL) 11-21-13.5 LF-MCG/0.5 injection; Inject 0.5 mLs into the muscle once.  Dispense: 0.5 mL; Refill: 0  7. Need for prophylactic vaccination and inoculation against other viral  diseases(V04.89) -script provided today--explained that this will go thru Rx benefit at pharmacy - zoster vaccine live, PF, (ZOSTAVAX) 24462 UNT/0.65ML injection; Inject 19,400 Units into the skin once.  Dispense: 1 each; Refill: 0  Labs/tests ordered:   Orders Placed This Encounter  Procedures  . CBC With differential/Platelet    Standing Status: Future     Number of Occurrences:      Standing Expiration Date: 08/09/2014  . Comprehensive metabolic panel    Standing Status: Future     Number of Occurrences:      Standing Expiration Date: 08/09/2014  . Hemoglobin A1c    Standing Status: Future     Number of Occurrences:      Standing Expiration Date: 08/09/2014    Next appt: 4 mos for annual exam with labs before

## 2013-12-09 ENCOUNTER — Telehealth: Payer: Self-pay | Admitting: *Deleted

## 2013-12-09 NOTE — Telephone Encounter (Signed)
RN called pharmacy to inquire on prior authorization for Xarelto. Pharmacy staff reports medication is going thru, but for $160/month, which Eliquis was going to cost $150/month. Will defer to Nicole [name] to advise, as she had been working on this situation a week or so ago

## 2013-12-11 MED ORDER — APIXABAN 5 MG PO TABS: ORAL_TABLET | ORAL | Status: DC

## 2013-12-11 NOTE — Telephone Encounter (Signed)
Spoke with patient, she cannot afford $150/month for Eliquis or Xarelto.  She may qualify for Eliquis pt assistance program, she will bring in paperwork for [place] to calculate.  Samples of Eliquis will be left at desk for her to pick up

## 2013-12-25 ENCOUNTER — Other Ambulatory Visit: Payer: Self-pay | Admitting: *Deleted

## 2013-12-25 MED ORDER — SIMVASTATIN 40 MG PO TABS: ORAL_TABLET | ORAL | Status: DC

## 2013-12-25 NOTE — Telephone Encounter (Signed)
[facility] 

## 2013-12-31 ENCOUNTER — Other Ambulatory Visit: Payer: Self-pay | Admitting: *Deleted

## 2013-12-31 MED ORDER — ALLOPURINOL 100 MG PO TABS: ORAL_TABLET | ORAL | Status: DC

## 2014-01-06 ENCOUNTER — Other Ambulatory Visit: Payer: Self-pay | Admitting: *Deleted

## 2014-01-06 MED ORDER — MAGNESIUM OXIDE 400 MG PO TABS: ORAL_TABLET | ORAL | Status: DC

## 2014-01-06 NOTE — Telephone Encounter (Signed)
[facility] 

## 2014-01-18 ENCOUNTER — Other Ambulatory Visit: Payer: Self-pay | Admitting: *Deleted

## 2014-01-18 MED ORDER — MAGNESIUM OXIDE 400 MG PO TABS: ORAL_TABLET | ORAL | Status: DC

## 2014-01-18 MED ORDER — METFORMIN HCL 500 MG PO TABS: 500.0000 mg | ORAL_TABLET | Freq: Two times a day (BID) | ORAL | Status: DC

## 2014-01-18 NOTE — Telephone Encounter (Signed)
[facility] 

## 2014-01-23 ENCOUNTER — Inpatient Hospital Stay (HOSPITAL_COMMUNITY)
Admission: EM | Admit: 2014-01-23 | Discharge: 2014-01-31 | DRG: 389 | Disposition: A | Discharge status: Home / Self Care | Payer: Medicare Other | Attending: Internal Medicine | Admitting: Internal Medicine

## 2014-01-23 ENCOUNTER — Emergency Department (HOSPITAL_COMMUNITY): Payer: Medicare Other

## 2014-01-23 ENCOUNTER — Encounter (HOSPITAL_COMMUNITY): Payer: Self-pay | Admitting: Emergency Medicine

## 2014-01-23 DIAGNOSIS — Z8249 Family history of ischemic heart disease and other diseases of the circulatory system: Secondary | ICD-10-CM

## 2014-01-23 DIAGNOSIS — E1129 Type 2 diabetes mellitus with other diabetic kidney complication: Secondary | ICD-10-CM | POA: Diagnosis not present

## 2014-01-23 DIAGNOSIS — R0603 Acute respiratory distress: Secondary | ICD-10-CM

## 2014-01-23 DIAGNOSIS — E11319 Type 2 diabetes mellitus with unspecified diabetic retinopathy without macular edema: Secondary | ICD-10-CM | POA: Diagnosis present

## 2014-01-23 DIAGNOSIS — N183 Chronic kidney disease, stage 3 unspecified: Secondary | ICD-10-CM | POA: Diagnosis present

## 2014-01-23 DIAGNOSIS — K565 Intestinal adhesions [bands], unspecified as to partial versus complete obstruction: Principal | ICD-10-CM | POA: Diagnosis present

## 2014-01-23 DIAGNOSIS — I5022 Chronic systolic (congestive) heart failure: Secondary | ICD-10-CM | POA: Diagnosis not present

## 2014-01-23 DIAGNOSIS — K56609 Unspecified intestinal obstruction, unspecified as to partial versus complete obstruction: Secondary | ICD-10-CM

## 2014-01-23 DIAGNOSIS — E785 Hyperlipidemia, unspecified: Secondary | ICD-10-CM | POA: Diagnosis present

## 2014-01-23 DIAGNOSIS — Z7901 Long term (current) use of anticoagulants: Secondary | ICD-10-CM

## 2014-01-23 DIAGNOSIS — E118 Type 2 diabetes mellitus with unspecified complications: Secondary | ICD-10-CM

## 2014-01-23 DIAGNOSIS — I129 Hypertensive chronic kidney disease with stage 1 through stage 4 chronic kidney disease, or unspecified chronic kidney disease: Secondary | ICD-10-CM | POA: Diagnosis present

## 2014-01-23 DIAGNOSIS — K9189 Other postprocedural complications and disorders of digestive system: Secondary | ICD-10-CM

## 2014-01-23 DIAGNOSIS — M129 Arthropathy, unspecified: Secondary | ICD-10-CM | POA: Diagnosis present

## 2014-01-23 DIAGNOSIS — I509 Heart failure, unspecified: Secondary | ICD-10-CM | POA: Diagnosis present

## 2014-01-23 DIAGNOSIS — I2789 Other specified pulmonary heart diseases: Secondary | ICD-10-CM | POA: Diagnosis present

## 2014-01-23 DIAGNOSIS — N289 Disorder of kidney and ureter, unspecified: Secondary | ICD-10-CM

## 2014-01-23 DIAGNOSIS — E860 Dehydration: Secondary | ICD-10-CM

## 2014-01-23 DIAGNOSIS — M109 Gout, unspecified: Secondary | ICD-10-CM | POA: Diagnosis present

## 2014-01-23 DIAGNOSIS — Z0389 Encounter for observation for other suspected diseases and conditions ruled out: Secondary | ICD-10-CM

## 2014-01-23 DIAGNOSIS — Z85038 Personal history of other malignant neoplasm of large intestine: Secondary | ICD-10-CM

## 2014-01-23 DIAGNOSIS — E1139 Type 2 diabetes mellitus with other diabetic ophthalmic complication: Secondary | ICD-10-CM | POA: Diagnosis present

## 2014-01-23 DIAGNOSIS — N058 Unspecified nephritic syndrome with other morphologic changes: Secondary | ICD-10-CM | POA: Diagnosis present

## 2014-01-23 DIAGNOSIS — E7800 Pure hypercholesterolemia, unspecified: Secondary | ICD-10-CM | POA: Diagnosis present

## 2014-01-23 DIAGNOSIS — E876 Hypokalemia: Secondary | ICD-10-CM | POA: Diagnosis not present

## 2014-01-23 DIAGNOSIS — I10 Essential (primary) hypertension: Secondary | ICD-10-CM

## 2014-01-23 DIAGNOSIS — N189 Chronic kidney disease, unspecified: Secondary | ICD-10-CM

## 2014-01-23 DIAGNOSIS — K219 Gastro-esophageal reflux disease without esophagitis: Secondary | ICD-10-CM | POA: Diagnosis present

## 2014-01-23 DIAGNOSIS — IMO0001 Reserved for inherently not codable concepts without codable children: Secondary | ICD-10-CM

## 2014-01-23 DIAGNOSIS — R52 Pain, unspecified: Secondary | ICD-10-CM

## 2014-01-23 DIAGNOSIS — K5909 Other constipation: Secondary | ICD-10-CM

## 2014-01-23 DIAGNOSIS — I712 Thoracic aortic aneurysm, without rupture, unspecified: Secondary | ICD-10-CM | POA: Diagnosis present

## 2014-01-23 DIAGNOSIS — I4891 Unspecified atrial fibrillation: Secondary | ICD-10-CM | POA: Diagnosis present

## 2014-01-23 DIAGNOSIS — I4892 Unspecified atrial flutter: Secondary | ICD-10-CM | POA: Diagnosis present

## 2014-01-23 DIAGNOSIS — I714 Abdominal aortic aneurysm, without rupture, unspecified: Secondary | ICD-10-CM | POA: Diagnosis present

## 2014-01-23 DIAGNOSIS — R1011 Right upper quadrant pain: Secondary | ICD-10-CM

## 2014-01-23 DIAGNOSIS — E039 Hypothyroidism, unspecified: Secondary | ICD-10-CM | POA: Diagnosis present

## 2014-01-23 DIAGNOSIS — E1121 Type 2 diabetes mellitus with diabetic nephropathy: Secondary | ICD-10-CM

## 2014-01-23 DIAGNOSIS — K567 Ileus, unspecified: Secondary | ICD-10-CM

## 2014-01-23 DIAGNOSIS — R32 Unspecified urinary incontinence: Secondary | ICD-10-CM | POA: Diagnosis not present

## 2014-01-23 DIAGNOSIS — E1149 Type 2 diabetes mellitus with other diabetic neurological complication: Secondary | ICD-10-CM | POA: Diagnosis present

## 2014-01-23 DIAGNOSIS — I482 Chronic atrial fibrillation, unspecified: Secondary | ICD-10-CM

## 2014-01-23 DIAGNOSIS — K838 Other specified diseases of biliary tract: Secondary | ICD-10-CM

## 2014-01-23 LAB — COMPREHENSIVE METABOLIC PANEL
ALK PHOS: 92 U/L (ref 39–117)
ALT: 10 U/L (ref 0–35)
ANION GAP: 15 (ref 5–15)
AST: 23 U/L (ref 0–37)
Albumin: 4.1 g/dL (ref 3.5–5.2)
BUN: 18 mg/dL (ref 6–23)
CHLORIDE: 99 meq/L (ref 96–112)
CO2: 28 mEq/L (ref 19–32)
Calcium: 9.8 mg/dL (ref 8.4–10.5)
Creatinine, Ser: 1.35 mg/dL — ABNORMAL HIGH (ref 0.50–1.10)
GFR calc non Af Amer: 36 mL/min — ABNORMAL LOW (ref >90)
GFR, EST AFRICAN AMERICAN: 42 mL/min — AB (ref >90)
GLUCOSE: 174 mg/dL — AB (ref 70–99)
POTASSIUM: 4 meq/L (ref 3.7–5.3)
Sodium: 142 mEq/L (ref 137–147)
Total Bilirubin: 0.8 mg/dL (ref 0.3–1.2)
Total Protein: 7.9 g/dL (ref 6.0–8.3)

## 2014-01-23 LAB — URINALYSIS, ROUTINE W REFLEX MICROSCOPIC
BILIRUBIN URINE: NEGATIVE
Glucose, UA: 100 mg/dL — AB
Ketones, ur: 15 mg/dL — AB
Leukocytes, UA: NEGATIVE
NITRITE: NEGATIVE
PROTEIN: 100 mg/dL — AB
SPECIFIC GRAVITY, URINE: 1.015 (ref 1.005–1.030)
UROBILINOGEN UA: 0.2 mg/dL (ref 0.0–1.0)
pH: 7 (ref 5.0–8.0)

## 2014-01-23 LAB — CBC WITH DIFFERENTIAL/PLATELET
BASOS ABS: 0 10*3/uL (ref 0.0–0.1)
Basophils Relative: 0 % (ref 0–1)
EOS ABS: 0 10*3/uL (ref 0.0–0.7)
Eosinophils Relative: 0 % (ref 0–5)
HCT: 39.9 % (ref 36.0–46.0)
Hemoglobin: 14.5 g/dL (ref 12.0–15.0)
Lymphocytes Relative: 15 % (ref 12–46)
Lymphs Abs: 1.7 10*3/uL (ref 0.7–4.0)
MCH: 30 pg (ref 26.0–34.0)
MCHC: 36.3 g/dL — AB (ref 30.0–36.0)
MCV: 82.4 fL (ref 78.0–100.0)
Monocytes Absolute: 0.6 10*3/uL (ref 0.1–1.0)
Monocytes Relative: 6 % (ref 3–12)
NEUTROS ABS: 8.8 10*3/uL — AB (ref 1.7–7.7)
NEUTROS PCT: 79 % — AB (ref 43–77)
Platelets: 271 10*3/uL (ref 150–400)
RBC: 4.84 MIL/uL (ref 3.87–5.11)
RDW: 14.9 % (ref 11.5–15.5)
WBC: 11.1 10*3/uL — ABNORMAL HIGH (ref 4.0–10.5)

## 2014-01-23 LAB — I-STAT TROPONIN, ED: Troponin i, poc: 0 ng/mL (ref 0.00–0.08)

## 2014-01-23 LAB — GLUCOSE, CAPILLARY
Glucose-Capillary: 190 mg/dL — ABNORMAL HIGH (ref 70–99)
Glucose-Capillary: 228 mg/dL — ABNORMAL HIGH (ref 70–99)

## 2014-01-23 LAB — I-STAT CG4 LACTIC ACID, ED: Lactic Acid, Venous: 1.98 mmol/L (ref 0.5–2.2)

## 2014-01-23 LAB — HEMOGLOBIN A1C
Hgb A1c MFr Bld: 5.5 % (ref <5.7)
Mean Plasma Glucose: 111 mg/dL (ref <117)

## 2014-01-23 LAB — URINE MICROSCOPIC-ADD ON

## 2014-01-23 MED ORDER — METOPROLOL TARTRATE 1 MG/ML IV SOLN
5.0000 mg | Freq: Four times a day (QID) | INTRAVENOUS | Status: DC
  Administered 2014-01-23 – 2014-01-24 (×4): 5 mg via INTRAVENOUS
  Filled 2014-01-23 (×7): qty 5

## 2014-01-23 MED ORDER — PROMETHAZINE HCL 25 MG/ML IJ SOLN
12.5000 mg | Freq: Once | INTRAMUSCULAR | Status: AC
  Administered 2014-01-23: 12.5 mg via INTRAVENOUS
  Filled 2014-01-23: qty 1

## 2014-01-23 MED ORDER — ONDANSETRON HCL 4 MG/2ML IJ SOLN: 4.0000 mg | Freq: Four times a day (QID) | INTRAMUSCULAR | Status: DC | PRN

## 2014-01-23 MED ORDER — BARIUM SULFATE 2.1 % PO SUSP
450.0000 mL | ORAL | Status: DC
  Administered 2014-01-23: 450 mL via ORAL

## 2014-01-23 MED ORDER — LEVOTHYROXINE SODIUM 100 MCG IV SOLR
25.0000 ug | Freq: Every day | INTRAVENOUS | Status: DC
  Administered 2014-01-23 – 2014-01-29 (×7): 25 ug via INTRAVENOUS
  Filled 2014-01-23 (×7): qty 5

## 2014-01-23 MED ORDER — MORPHINE SULFATE 4 MG/ML IJ SOLN
6.0000 mg | Freq: Once | INTRAMUSCULAR | Status: AC
  Administered 2014-01-23: 6 mg via INTRAVENOUS
  Filled 2014-01-23: qty 2

## 2014-01-23 MED ORDER — ONDANSETRON HCL 4 MG/2ML IJ SOLN
4.0000 mg | Freq: Once | INTRAMUSCULAR | Status: AC
  Administered 2014-01-23: 4 mg via INTRAVENOUS
  Filled 2014-01-23: qty 2

## 2014-01-23 MED ORDER — MORPHINE SULFATE 2 MG/ML IJ SOLN
2.0000 mg | INTRAMUSCULAR | Status: DC | PRN
  Administered 2014-01-23 – 2014-01-25 (×5): 2 mg via INTRAVENOUS
  Filled 2014-01-23 (×6): qty 1

## 2014-01-23 MED ORDER — CHLORHEXIDINE GLUCONATE 0.12 % MT SOLN
15.0000 mL | Freq: Two times a day (BID) | OROMUCOSAL | Status: DC
Start: 2014-01-23 — End: 2014-01-31
  Administered 2014-01-23 – 2014-01-30 (×14): 15 mL via OROMUCOSAL
  Filled 2014-01-23 (×17): qty 15

## 2014-01-23 MED ORDER — MORPHINE SULFATE 4 MG/ML IJ SOLN
4.0000 mg | Freq: Once | INTRAMUSCULAR | Status: AC
  Administered 2014-01-23: 4 mg via INTRAVENOUS
  Filled 2014-01-23: qty 1

## 2014-01-23 MED ORDER — HYDRALAZINE HCL 20 MG/ML IJ SOLN
10.0000 mg | INTRAMUSCULAR | Status: DC | PRN
  Administered 2014-01-23 – 2014-01-25 (×3): 10 mg via INTRAVENOUS
  Filled 2014-01-23 (×3): qty 1

## 2014-01-23 MED ORDER — INSULIN ASPART 100 UNIT/ML ~~LOC~~ SOLN
0.0000 [IU] | SUBCUTANEOUS | Status: DC
  Administered 2014-01-23: 2 [IU] via SUBCUTANEOUS
  Administered 2014-01-23: 3 [IU] via SUBCUTANEOUS
  Administered 2014-01-24 (×2): 1 [IU] via SUBCUTANEOUS
  Administered 2014-01-24: 2 [IU] via SUBCUTANEOUS
  Administered 2014-01-24 (×3): 1 [IU] via SUBCUTANEOUS
  Administered 2014-01-25: 2 [IU] via SUBCUTANEOUS
  Administered 2014-01-25 (×3): 1 [IU] via SUBCUTANEOUS
  Administered 2014-01-26: 2 [IU] via SUBCUTANEOUS
  Administered 2014-01-26: 1 [IU] via SUBCUTANEOUS
  Administered 2014-01-26: 2 [IU] via SUBCUTANEOUS
  Administered 2014-01-27: 1 [IU] via SUBCUTANEOUS
  Administered 2014-01-27: 2 [IU] via SUBCUTANEOUS
  Administered 2014-01-27: 1 [IU] via SUBCUTANEOUS
  Administered 2014-01-27: 2 [IU] via SUBCUTANEOUS
  Administered 2014-01-28 – 2014-01-29 (×6): 1 [IU] via SUBCUTANEOUS

## 2014-01-23 MED ORDER — ACETAMINOPHEN 650 MG RE SUPP: 650.0000 mg | Freq: Four times a day (QID) | RECTAL | Status: DC | PRN

## 2014-01-23 MED ORDER — BIOTENE DRY MOUTH MT LIQD
15.0000 mL | Freq: Two times a day (BID) | OROMUCOSAL | Status: DC
  Administered 2014-01-23 – 2014-01-30 (×15): 15 mL via OROMUCOSAL

## 2014-01-23 MED ORDER — SODIUM CHLORIDE 0.9 % IV BOLUS (SEPSIS)
1000.0000 mL | Freq: Once | INTRAVENOUS | Status: AC
  Administered 2014-01-23: 1000 mL via INTRAVENOUS

## 2014-01-23 MED ORDER — LIDOCAINE VISCOUS 2 % MT SOLN
15.0000 mL | Freq: Once | OROMUCOSAL | Status: AC
  Administered 2014-01-23: 15 mL via OROMUCOSAL
  Filled 2014-01-23: qty 15

## 2014-01-23 MED ORDER — SODIUM CHLORIDE 0.9 % IV SOLN
INTRAVENOUS | Status: DC
  Administered 2014-01-23 – 2014-01-26 (×7): via INTRAVENOUS

## 2014-01-23 NOTE — ED Notes (Signed)
Phlebotomy at the bedside  

## 2014-01-23 NOTE — ED Notes (Signed)
Patient given mouth swabs, NPO at this time.

## 2014-01-23 NOTE — ED Notes (Signed)
Admitting MD at the bedside.  

## 2014-01-23 NOTE — ED Notes (Signed)
Pt requesting something to eat. Informed the test results were not back yet. Pt verbalized understanding. Family at the bedside.

## 2014-01-23 NOTE — H&P (Addendum)
[facility] History and Physical  Nicole [name] OEV:[id number] DOB: 01/03/1931 DOA: 01/23/2014  Referring physician: EDP PCP: Hollace Kinnier, DO   Chief Complaint: Abd pain, N/V  HPI: Nicole [name] is a 80 y.o. female with PMH of chronic systolic CHF EF of 82%, DM, CKD, Chronic afib on apixaban, h/o cholecystectomy (10/13) and surgery for COlon CA in 99 presents to the ER with the above complaint. She's had intermittent abdominal pain on and off for about 1 month, but worsened yesterday and started having nausea and vomiting too. DUe to persistence of symptoms presented to the ER today, In ER, noted to have Abd distension, tenderness and CT with PSBO with transition point.    Review of Systems: positives bolded Constitutional:  No weight loss, night sweats, Fevers, chills, fatigue.  HEENT:  No headaches, Difficulty swallowing,Tooth/dental problems,Sore throat,  No sneezing, itching, ear ache, nasal congestion, post nasal drip,  Cardio-vascular:  No chest pain, Orthopnea, PND, swelling in lower extremities, anasarca, dizziness, palpitations  GI:  No heartburn, indigestion, abdominal pain, nausea, vomiting, diarrhea, change in bowel habits, loss of appetite  Resp:  No shortness of breath with exertion or at rest. No excess mucus, no productive cough, No non-productive cough, No coughing up of blood.No change in color of mucus.No wheezing.No chest wall deformity  Skin:  no rash or lesions.  GU:  no dysuria, change in color of urine, no urgency or frequency. No flank pain.  Musculoskeletal:  No joint pain or swelling. No decreased range of motion. No back pain.  Psych:  No change in mood or affect. No depression or anxiety. No memory loss.   Past Medical History  Diagnosis Date  . Colon cancer 07/1997  . Hypertension   . Diabetes mellitus   . CHF (congestive heart failure)   . AAA (abdominal aortic aneurysm)   . Atrial fibrillation   . Hypercholesteremia   . Kidney  stone     renal calculi  . Small bowel obstruction due to adhesions 05/16/2012  . Hypothyroidism   . Arthritis   . Anemia   . Cholelithiasis   . Perforation of bile duct   . Hemorrhage of rectum and anus   . Swelling, mass, or lump in head and neck   . Cervicalgia   . Hypoglycemia, unspecified   . Pain in joint, lower leg   . Anxiety   . Shortness of breath   . Acute upper respiratory infections of unspecified site   . Depression   . Thoracic aneurysm without mention of rupture   . GERD (gastroesophageal reflux disease)   . Proteinuria   . Other specified cardiac dysrhythmias(427.89)   . Congestive heart failure, unspecified   . Electrolyte and fluid disorders not elsewhere classified   . Other chronic allergic conjunctivitis   . Hypopotassemia   . Chronic systolic heart failure   . Dizziness and giddiness   . Malignant neoplasm of colon, unspecified site   . Other and unspecified hyperlipidemia   . Gout, unspecified   . Atrial fibrillation   . Aortic aneurysm of unspecified site without mention of rupture   . Pain in joint, site unspecified   . Palpitations   . CKD (chronic kidney disease)    Past Surgical History  Procedure Laterality Date  . Hemicolectomy  08/11/1997  . Kidney stone surgery    . Colon surgery      to remove colon ca  . Cholecystectomy  05/14/2012    Procedure: LAPAROSCOPIC CHOLECYSTECTOMY WITH  INTRAOPERATIVE CHOLANGIOGRAM;  Surgeon: Haywood Lasso, MD;  Location: [place];  Service: General;  Laterality: N/A;  . Ercp  05/17/2012    Procedure: ENDOSCOPIC RETROGRADE CHOLANGIOPANCREATOGRAPHY (ERCP);  Surgeon: Missy Sabins, MD;  Location: [place];  Service: Gastroenterology;  Laterality: N/A;  . Ercp  08/12/2012    Procedure: ENDOSCOPIC RETROGRADE CHOLANGIOPANCREATOGRAPHY (ERCP);  Surgeon: Missy Sabins, MD;  Location: [facility] ENDOSCOPY;  Service: Endoscopy;  Laterality: N/A;  . Transthoracic echocardiogram  11/2009    EF 45-50%, mild-mod AV regurg; mild MR;  mod TR; ascending aorta mildly dilated  . Cardiac catheterization  2008    moderate severe pulm HTN; with elevted pulm capillary wedge pressure    Social History:  reports that she has never smoked. She has never used smokeless tobacco. She reports that she does not drink alcohol or use illicit drugs.  Allergies  Allergen Reactions  . Iohexol      Code: VOM, Desc: PT REPORTS VOMITING W/ IVP DYE- ARS 12/26/08, Onset Date: [id number]   . Z-Pak [Azithromycin]   . Iodinated Diagnostic Agents Nausea Only    PT HAS HAD SINGULAR INCIDENT OF NAUSEA  W/ IV CONTYRAST INJECTION, NONE IF INJECTED SLOWLY//A.[facility]    Family History  Problem Relation Age of Onset  . Heart disease Mother   . Stroke Father   . Hypertension Daughter   . Hypertension Son   . Diabetes Sister   . Hypertension Son      Prior to Admission medications   Medication Sig Start Date End Date Taking? Authorizing Provider  allopurinol (ZYLOPRIM) 100 MG tablet Take 1 tablet by mouth twice a day for CHRONIC GOUT 12/31/13  Yes [name] [name], DO  apixaban (ELIQUIS) 5 MG TABS tablet Take 1 tablet by mouth every 12 hours. 12/11/13  Yes Tommy Medal, RPH-CPP  diltiazem (CARTIA XT) 180 MG 24 hr capsule Take 1 capsule (180 mg total) by mouth daily. 11/25/13  Yes Pixie Casino, MD  furosemide (LASIX) 40 MG tablet take 1 tablet by mouth twice a day 05/18/13  Yes Pixie Casino, MD  levothyroxine (SYNTHROID, LEVOTHROID) 50 MCG tablet Take 1 tablet (50 mcg total) by mouth daily. 07/01/13  Yes Pricilla Larsson, NP  magnesium oxide (MAG-OX) 400 MG tablet Take one tablet by mouth three times daily for magnesium supplement 01/18/14  Yes [name] [name], DO  metFORMIN (GLUCOPHAGE) 500 MG tablet Take 1 tablet (500 mg total) by mouth 2 (two) times daily with a meal. 01/18/14  Yes [name] [name], DO  polyethylene glycol (MIRALAX / GLYCOLAX) packet Take 17 g by mouth daily as needed for mild constipation.    Yes Historical Provider, MD  potassium  chloride SA (KLOR-CON M20) 20 MEQ tablet Take one tablet by mouth once daily 09/25/13  Yes [name] [name], DO  simvastatin (ZOCOR) 40 MG tablet Take one tablet by mouth once daily 12/25/13  Yes Pricilla Larsson, NP   Physical Exam: Filed Vitals:   01/23/14 1230  BP: 187/87  Pulse: 90  Temp:   Resp: 18    BP 187/87  Pulse 90  Temp(Src) 97.5 F (36.4 C) (Oral)  Resp 18  Ht 5\' 10"  (1.778 m)  Wt 78.926 kg (174 lb)  BMI 24.97 kg/m2  SpO2 96%  General:  Appears calm and comfortable, no distress AAOx3 Eyes: PERRL, normal lids, irises & conjunctiva ENT: grossly normal hearing, lips & tongue Neck: no LAD, masses or thyromegaly Cardiovascular: Irregular rate and rhythm, no m/r/g.  No LE edema. Respiratory: CTA bilaterally, no w/r/r. Normal respiratory effort. Abdomen: soft, distended, mild epigastric and R sided tenderness, BS diminished  Skin: no rash or induration seen on limited exam Musculoskeletal: grossly normal tone BUE/BLE Psychiatric: grossly normal mood and affect, speech fluent and appropriate Neurologic: grossly non-focal.          Labs on Admission:  Basic Metabolic Panel:  Recent Labs Lab 01/23/14 0632  NA 142  K 4.0  CL 99  CO2 28  GLUCOSE 174*  BUN 18  CREATININE 1.35*  CALCIUM 9.8   Liver Function Tests:  Recent Labs Lab 01/23/14 0632  AST 23  ALT 10  ALKPHOS 92  BILITOT 0.8  PROT 7.9  ALBUMIN 4.1   No results found for this basename: LIPASE, AMYLASE,  in the last 168 hours No results found for this basename: AMMONIA,  in the last 168 hours CBC:  Recent Labs Lab 01/23/14 0632  WBC 11.1*  NEUTROABS 8.8*  HGB 14.5  HCT 39.9  MCV 82.4  PLT 271   Cardiac Enzymes: No results found for this basename: CKTOTAL, CKMB, CKMBINDEX, TROPONINI,  in the last 168 hours  BNP (last 3 results) No results found for this basename: PROBNP,  in the last 8760 hours CBG: No results found for this basename: GLUCAP,  in the last 168 hours  Radiological Exams  on Admission: Ct Abdomen Pelvis Wo Contrast  01/23/2014   CLINICAL DATA:  Epigastric pain and nausea. History of bowel resection for colon cancer.  EXAM: CT ABDOMEN AND PELVIS WITHOUT CONTRAST  TECHNIQUE: Multidetector CT imaging of the abdomen and pelvis was performed following the standard protocol without IV contrast.  COMPARISON:  CT abdomen and pelvis with contrast 02/09/2013.  FINDINGS: Mild dependent atelectasis is present at the lung bases bilaterally. No focal nodule, mass, or airspace disease is present. The heart is mildly enlarged without significant change. Atherosclerotic calcifications are present in the aorta without aneurysm.  Minimal fluid is present around the liver. The liver is otherwise unremarkable. Patient is status post splenectomy. Multiple soft tissue nodules are stable, reflecting residual splenules. Contrast is present within the stomach. The duodenum is unremarkable. The pancreas is within normal limits. There is significant interval enlargement of the common bile duct, now measuring 2.4 cm. No obstructing mass is evident. The adrenal glands are normal bilaterally. Renal atrophy is stable. The ureters are within normal limits.  The rectosigmoid colon is within normal limits. The left lower quadrant anastomosis is intact. The more proximal colon is within normal limits as well. The appendix is visualized and normal.  Dilated loops of small bowel are present in the mid abdomen with gradual dilution of contrast. The transition point is in the low anterior pelvis appears to be associated with adhesions. No obstructing mass lesion is present. There is no free fluid in the pelvis. The patient is status post hysterectomy. The ovaries are visualized and appear to be within normal limits.  The bone windows demonstrate partial fusion along the SI joints bilaterally. Mild degenerative changes are noted in the lumbar spine. No focal lytic or blastic lesions are evident.  IMPRESSION: 1. Partial  small bowel obstruction with transition point in the mid lower abdomen likely related to adhesions. 2. Stable appearance of the chronic CT anastomosis. 3. Atherosclerosis. 4. Significant interval increase and the size the common bile duct without an obstructing lesion. 5. Mild cardiomegaly without failure.   Electronically Signed   By: Lawrence Santiago M.D.   On:  01/23/2014 12:10    EKG: pending  Assessment/Plan Principal Problem:   SBO (small bowel obstruction) -due to adhesions with transition point on CT -CCS consulted per EDP -NPO, IVF-cautious, NGT to ILWS -KUB in am    CHF, recent  EF 37- 50% -clinically compensated -hold lasix    Chronic afib -rate controlled, hold Diltiazem since NPO -IV metoprolol q4H with holding parameters -hold apixaban, pending surgical eval -if unable to resume PO soon, need to consider bridging with Heparin    Type II diabetes mellitus  -stable, hold metformin, SSI for now    CKD 3 -stable    CBD enlargement on imaging -LFTS, bili WNL, s/p cholecystectomy -needs further workup  once stable   Hypothyroidism -continue synthroid IV  DVT proph: hold apixaban, SCDs for now  Code Status: Full COde Family Communication: d/w pt and friend at bedside Disposition Plan: inpatient Time spent: 65min  Siddhartha Hoback [facility] Pager [phone]  **Disclaimer: This note may have been dictated with voice recognition software. Similar sounding words can inadvertently be transcribed and this note may contain transcription errors which may not have been corrected upon publication of note.**

## 2014-01-23 NOTE — ED Notes (Signed)
Dr. Stevie Kern at the bedside.

## 2014-01-23 NOTE — ED Notes (Signed)
Pt back from CT scan. Nauseated at this time.

## 2014-01-23 NOTE — Progress Notes (Signed)
NURSING PROGRESS NOTE  Nicole [name] [id number] Admitted to 5w35: 01/23/2014 Attending Provider: Domenic Polite, MD    Nicole [name] is a 80 y.o. female patient admitted from ED awake, alert  & orientated  X 4,  Full Code, VSS - Blood pressure 163/96, pulse 100, temperature 97.5 F (36.4 C), temperature source Oral, resp. rate 16, height 5\' 10"  (1.778 m), weight 80.1 kg (176 lb 9.4 oz), SpO2 93.00%.RA, no c/o shortness of breath, no c/o chest pain, no distress noted.   IV site WDL:  antecubital left, condition patent and no redness with a transparent dsg that's clean dry and intact.  Allergies:   Allergies  Allergen Reactions  . Iohexol      Code: VOM, Desc: PT REPORTS VOMITING W/ IVP DYE- ARS 12/26/08, Onset Date: [id number]   . Z-Pak [Azithromycin]   . Iodinated Diagnostic Agents Nausea Only    PT HAS HAD SINGULAR INCIDENT OF NAUSEA  W/ IV CONTYRAST INJECTION, NONE IF INJECTED SLOWLY//A.[place]     Past Medical History  Diagnosis Date  . Colon cancer 07/1997  . Hypertension   . Diabetes mellitus   . CHF (congestive heart failure)   . AAA (abdominal aortic aneurysm)   . Atrial fibrillation   . Hypercholesteremia   . Kidney stone     renal calculi  . Small bowel obstruction due to adhesions 05/16/2012  . Hypothyroidism   . Arthritis   . Anemia   . Cholelithiasis   . Perforation of bile duct   . Hemorrhage of rectum and anus   . Swelling, mass, or lump in head and neck   . Cervicalgia   . Hypoglycemia, unspecified   . Pain in joint, lower leg   . Anxiety   . Shortness of breath   . Acute upper respiratory infections of unspecified site   . Depression   . Thoracic aneurysm without mention of rupture   . GERD (gastroesophageal reflux disease)   . Proteinuria   . Other specified cardiac dysrhythmias(427.89)   . Congestive heart failure, unspecified   . Electrolyte and fluid disorders not elsewhere classified   . Other chronic allergic conjunctivitis   .  Hypopotassemia   . Chronic systolic heart failure   . Dizziness and giddiness   . Malignant neoplasm of colon, unspecified site   . Other and unspecified hyperlipidemia   . Gout, unspecified   . Atrial fibrillation   . Aortic aneurysm of unspecified site without mention of rupture   . Pain in joint, site unspecified   . Palpitations   . CKD (chronic kidney disease)     History:  obtained from the patient.  Pt orientation to unit, room and routine. Information packet given to patient/family and safety video watched.  Admission INP armband ID verified with patient/family, and in place. SR up x 2, fall risk assessment complete with Patient and family verbalizing understanding of risks associated with falls. Pt verbalizes an understanding of how to use the call bell and to call for help before getting out of bed.  Skin, clean-dry- intact without evidence of bruising, patient has a circular scab below right knee bumped into furniture .   No evidence of skin break down noted on exam.   Will cont to monitor and assist as needed.  Darreld Mclean [place], RN 01/23/2014

## 2014-01-23 NOTE — Progress Notes (Signed)
EKG complete showed aflutter HR 108, Dr. Broadus John notified. Orders obtained to place patient on Telemetry. Will continue to monitor and assist as needed. Darreld Mclean [facility]

## 2014-01-23 NOTE — Consult Note (Signed)
Reason for Consult:SBO Referring Physician: Irena Cords, PA-C  Nicole FITZSIMMONS is an 80 y.o. female.  HPI: Nicole [name] comes in with about a 1 month history of intermittent epigastric pain associated with occasional nausea and vomiting. She would have pain-free periods. She has taken Tums and Miralax with improvement in her symptoms. Last night however the pain increased in intensity and did not abate. She had several episodes of N/V. She presented to the ED for evaluation and a CT scan showed a SBO.   Past Medical History  Diagnosis Date  . Colon cancer 07/1997  . Hypertension   . Diabetes mellitus   . CHF (congestive heart failure)   . AAA (abdominal aortic aneurysm)   . Atrial fibrillation   . Hypercholesteremia   . Kidney stone     renal calculi  . Small bowel obstruction due to adhesions 05/16/2012  . Hypothyroidism   . Arthritis   . Anemia   . Cholelithiasis   . Perforation of bile duct   . Hemorrhage of rectum and anus   . Swelling, mass, or lump in head and neck   . Cervicalgia   . Hypoglycemia, unspecified   . Pain in joint, lower leg   . Anxiety   . Shortness of breath   . Acute upper respiratory infections of unspecified site   . Depression   . Thoracic aneurysm without mention of rupture   . GERD (gastroesophageal reflux disease)   . Proteinuria   . Other specified cardiac dysrhythmias(427.89)   . Congestive heart failure, unspecified   . Electrolyte and fluid disorders not elsewhere classified   . Other chronic allergic conjunctivitis   . Hypopotassemia   . Chronic systolic heart failure   . Dizziness and giddiness   . Malignant neoplasm of colon, unspecified site   . Other and unspecified hyperlipidemia   . Gout, unspecified   . Atrial fibrillation   . Aortic aneurysm of unspecified site without mention of rupture   . Pain in joint, site unspecified   . Palpitations   . CKD (chronic kidney disease)     Past Surgical History  Procedure Laterality Date   . Hemicolectomy  08/11/1997  . Kidney stone surgery    . Colon surgery      to remove colon ca  . Cholecystectomy  05/14/2012    Procedure: LAPAROSCOPIC CHOLECYSTECTOMY WITH INTRAOPERATIVE CHOLANGIOGRAM;  Surgeon: Haywood Lasso, MD;  Location: [facility];  Service: General;  Laterality: N/A;  . Ercp  05/17/2012    Procedure: ENDOSCOPIC RETROGRADE CHOLANGIOPANCREATOGRAPHY (ERCP);  Surgeon: Missy Sabins, MD;  Location: [facility];  Service: Gastroenterology;  Laterality: N/A;  . Ercp  08/12/2012    Procedure: ENDOSCOPIC RETROGRADE CHOLANGIOPANCREATOGRAPHY (ERCP);  Surgeon: Missy Sabins, MD;  Location: [facility] ENDOSCOPY;  Service: Endoscopy;  Laterality: N/A;  . Transthoracic echocardiogram  11/2009    EF 45-50%, mild-mod AV regurg; mild MR; mod TR; ascending aorta mildly dilated  . Cardiac catheterization  2008    moderate severe pulm HTN; with elevted pulm capillary wedge pressure     Family History  Problem Relation Age of Onset  . Heart disease Mother   . Stroke Father   . Hypertension Daughter   . Hypertension Son   . Diabetes Sister   . Hypertension Son     Social History:  reports that she has never smoked. She has never used smokeless tobacco. She reports that she does not drink alcohol or use illicit drugs.  Allergies:  Allergies  Allergen Reactions  . Iohexol      Code: VOM, Desc: PT REPORTS VOMITING W/ IVP DYE- ARS 12/26/08, Onset Date: [id number]   . Z-Pak [Azithromycin]   . Iodinated Diagnostic Agents Nausea Only    PT HAS HAD SINGULAR INCIDENT OF NAUSEA  W/ IV CONTYRAST INJECTION, NONE IF INJECTED SLOWLY//A.[place]    Medications: I have reviewed the patient's current medications.  Results for orders placed during the hospital encounter of 01/23/14 (from the past 48 hour(s))  COMPREHENSIVE METABOLIC PANEL     Status: Abnormal   Collection Time    01/23/14  6:32 AM      Result Value Ref Range   Sodium 142  137 - 147 mEq/L   Potassium 4.0  3.7 - 5.3 mEq/L   Chloride 99  96  - 112 mEq/L   CO2 28  19 - 32 mEq/L   Glucose, Bld 174 (*) 70 - 99 mg/dL   BUN 18  6 - 23 mg/dL   Creatinine, Ser 1.35 (*) 0.50 - 1.10 mg/dL   Calcium 9.8  8.4 - 10.5 mg/dL   Total Protein 7.9  6.0 - 8.3 g/dL   Albumin 4.1  3.5 - 5.2 g/dL   AST 23  0 - 37 U/L   ALT 10  0 - 35 U/L   Alkaline Phosphatase 92  39 - 117 U/L   Total Bilirubin 0.8  0.3 - 1.2 mg/dL   GFR calc non Af Amer 36 (*) >90 mL/min   GFR calc Af Amer 42 (*) >90 mL/min   Comment: (NOTE)     The eGFR has been calculated using the CKD EPI equation.     This calculation has not been validated in all clinical situations.     eGFR's persistently <90 mL/min signify possible Chronic Kidney     Disease.   Anion gap 15  5 - 15  CBC WITH DIFFERENTIAL     Status: Abnormal   Collection Time    01/23/14  6:32 AM      Result Value Ref Range   WBC 11.1 (*) 4.0 - 10.5 K/uL   RBC 4.84  3.87 - 5.11 MIL/uL   Hemoglobin 14.5  12.0 - 15.0 g/dL   HCT 39.9  36.0 - 46.0 %   MCV 82.4  78.0 - 100.0 fL   MCH 30.0  26.0 - 34.0 pg   MCHC 36.3 (*) 30.0 - 36.0 g/dL   RDW 14.9  11.5 - 15.5 %   Platelets 271  150 - 400 K/uL   Neutrophils Relative % 79 (*) 43 - 77 %   Neutro Abs 8.8 (*) 1.7 - 7.7 K/uL   Lymphocytes Relative 15  12 - 46 %   Lymphs Abs 1.7  0.7 - 4.0 K/uL   Monocytes Relative 6  3 - 12 %   Monocytes Absolute 0.6  0.1 - 1.0 K/uL   Eosinophils Relative 0  0 - 5 %   Eosinophils Absolute 0.0  0.0 - 0.7 K/uL   Basophils Relative 0  0 - 1 %   Basophils Absolute 0.0  0.0 - 0.1 K/uL  I-STAT TROPOININ, ED     Status: None   Collection Time    01/23/14  6:36 AM      Result Value Ref Range   Troponin i, poc 0.00  0.00 - 0.08 ng/mL   Comment 3            Comment: Due to the release kinetics of cTnI,  a negative result within the first hours     of the onset of symptoms does not rule out     myocardial infarction with certainty.     If myocardial infarction is still suspected,     repeat the test at appropriate intervals.   I-STAT CG4 LACTIC ACID, ED     Status: None   Collection Time    01/23/14  7:31 AM      Result Value Ref Range   Lactic Acid, Venous 1.98  0.5 - 2.2 mmol/L  URINALYSIS, ROUTINE W REFLEX MICROSCOPIC     Status: Abnormal   Collection Time    01/23/14  9:05 AM      Result Value Ref Range   Color, Urine YELLOW  YELLOW   APPearance CLEAR  CLEAR   Specific Gravity, Urine 1.015  1.005 - 1.030   pH 7.0  5.0 - 8.0   Glucose, UA 100 (*) NEGATIVE mg/dL   Hgb urine dipstick TRACE (*) NEGATIVE   Bilirubin Urine NEGATIVE  NEGATIVE   Ketones, ur 15 (*) NEGATIVE mg/dL   Protein, ur 100 (*) NEGATIVE mg/dL   Urobilinogen, UA 0.2  0.0 - 1.0 mg/dL   Nitrite NEGATIVE  NEGATIVE   Leukocytes, UA NEGATIVE  NEGATIVE  URINE MICROSCOPIC-ADD ON     Status: Abnormal   Collection Time    01/23/14  9:05 AM      Result Value Ref Range   Squamous Epithelial / LPF FEW (*) RARE   RBC / HPF 3-6  <3 RBC/hpf    Ct Abdomen Pelvis Wo Contrast  01/23/2014   CLINICAL DATA:  Epigastric pain and nausea. History of bowel resection for colon cancer.  EXAM: CT ABDOMEN AND PELVIS WITHOUT CONTRAST  TECHNIQUE: Multidetector CT imaging of the abdomen and pelvis was performed following the standard protocol without IV contrast.  COMPARISON:  CT abdomen and pelvis with contrast 02/09/2013.  FINDINGS: Mild dependent atelectasis is present at the lung bases bilaterally. No focal nodule, mass, or airspace disease is present. The heart is mildly enlarged without significant change. Atherosclerotic calcifications are present in the aorta without aneurysm.  Minimal fluid is present around the liver. The liver is otherwise unremarkable. Patient is status post splenectomy. Multiple soft tissue nodules are stable, reflecting residual splenules. Contrast is present within the stomach. The duodenum is unremarkable. The pancreas is within normal limits. There is significant interval enlargement of the common bile duct, now measuring 2.4 cm. No  obstructing mass is evident. The adrenal glands are normal bilaterally. Renal atrophy is stable. The ureters are within normal limits.  The rectosigmoid colon is within normal limits. The left lower quadrant anastomosis is intact. The more proximal colon is within normal limits as well. The appendix is visualized and normal.  Dilated loops of small bowel are present in the mid abdomen with gradual dilution of contrast. The transition point is in the low anterior pelvis appears to be associated with adhesions. No obstructing mass lesion is present. There is no free fluid in the pelvis. The patient is status post hysterectomy. The ovaries are visualized and appear to be within normal limits.  The bone windows demonstrate partial fusion along the SI joints bilaterally. Mild degenerative changes are noted in the lumbar spine. No focal lytic or blastic lesions are evident.  IMPRESSION: 1. Partial small bowel obstruction with transition point in the mid lower abdomen likely related to adhesions. 2. Stable appearance of the chronic CT anastomosis. 3. Atherosclerosis. 4. Significant interval  increase and the size the common bile duct without an obstructing lesion. 5. Mild cardiomegaly without failure.   Electronically Signed   By: Lawrence Santiago M.D.   On: 01/23/2014 12:10    Review of Systems  Gastrointestinal: Positive for nausea, vomiting, abdominal pain and constipation.  All other systems reviewed and are negative.  Blood pressure 176/112, pulse 103, temperature 97.5 F (36.4 C), temperature source Oral, resp. rate 11, height 5' 10"  (1.778 m), weight 174 lb (78.926 kg), SpO2 91.00%. Physical Exam  Constitutional: She appears well-developed and well-nourished. No distress.  HENT:  Head: Normocephalic and atraumatic.  Eyes: Right eye exhibits no discharge. Left eye exhibits no discharge. No scleral icterus.  Neck: Normal range of motion. Neck supple.  Cardiovascular: Normal pulses.  An irregularly  irregular rhythm present. Tachycardia present.   Respiratory: Effort normal and breath sounds normal. No respiratory distress. She has no wheezes. She has no rales.  GI: Soft. Bowel sounds are normal. She exhibits distension. There is tenderness. There is no rebound and no guarding.  Musculoskeletal: Normal range of motion.  Neurological: She is alert.  Skin: Skin is warm and dry. She is not diaphoretic.  Psychiatric: She has a normal mood and affect. Her behavior is normal.    Assessment/Plan: SBO -- Already has a NGT. Keep NPO and will hope we can resolve this with supportive care. We will follow along with IM. Multiple medical problems -- per primary service    Lisette Abu, PA-C Pager: [phone] 01/23/2014, 2:14 PM

## 2014-01-23 NOTE — ED Provider Notes (Signed)
Medical screening examination/treatment/procedure(s) were conducted as a shared visit with non-physician practitioner(s) and myself.  I personally evaluated the patient during the encounter.   EKG Interpretation None     Small BM this AM, over 12 hours diffuse pain and multiple emesis, mild diffuse tenderness without rebound  Babette Relic, MD 01/23/14 1859

## 2014-01-23 NOTE — ED Notes (Signed)
Gerald Stabs, [place], at the bedside.

## 2014-01-23 NOTE — ED Notes (Signed)
CT contacted in regards to CT results. Radiologist reading it at this time.

## 2014-01-23 NOTE — Consult Note (Signed)
Seen, agree with above. NGT/NPO

## 2014-01-23 NOTE — ED Notes (Signed)
Per EMS, the patient started having abdominal pain, N/V around 8pm yesterday. Complaining of perfuse abdominal pain. Denies diarrhea. Pain 5/10. VS BP 200/110, P 110, irregular heart rate. 2L nasal cannula enroute. From home.  18g in left AC.

## 2014-01-23 NOTE — ED Provider Notes (Signed)
CSN: [id number]     Arrival date & time 01/23/14  0614 History   First MD Initiated Contact with Patient 01/23/14 [phone]     Chief Complaint  Patient presents with  . Abdominal Pain     (Consider location/radiation/quality/duration/timing/severity/associated sxs/prior Treatment) HPI Patient presents to the emergency department with mid abdominal pain, with nausea and vomiting, since 8 PM last night.  The patient, states, that nothing seems to make her condition, better, but palpation makes the pain, worse.  Patient, states, that she took ibuprofen at home without significant relief of her symptoms.  Patient, states, that she did not have any chest pain, nausea, vomiting, weakness, dizziness, headache, blurred vision, back pain, dysuria, hematemesis, bloody stool, fever, rash, or syncope.  The patient, states, that she's had previous surgery for gallbladder removal and colon cancer. Past Medical History  Diagnosis Date  . Colon cancer 07/1997  . Hypertension   . Diabetes mellitus   . CHF (congestive heart failure)   . AAA (abdominal aortic aneurysm)   . Atrial fibrillation   . Hypercholesteremia   . Kidney stone     renal calculi  . Small bowel obstruction due to adhesions 05/16/2012  . Hypothyroidism   . Arthritis   . Anemia   . Cholelithiasis   . Perforation of bile duct   . Hemorrhage of rectum and anus   . Swelling, mass, or lump in head and neck   . Cervicalgia   . Hypoglycemia, unspecified   . Pain in joint, lower leg   . Anxiety   . Shortness of breath   . Acute upper respiratory infections of unspecified site   . Depression   . Thoracic aneurysm without mention of rupture   . GERD (gastroesophageal reflux disease)   . Proteinuria   . Other specified cardiac dysrhythmias(427.89)   . Congestive heart failure, unspecified   . Electrolyte and fluid disorders not elsewhere classified   . Other chronic allergic conjunctivitis   . Hypopotassemia   . Chronic systolic heart  failure   . Dizziness and giddiness   . Malignant neoplasm of colon, unspecified site   . Other and unspecified hyperlipidemia   . Gout, unspecified   . Atrial fibrillation   . Aortic aneurysm of unspecified site without mention of rupture   . Pain in joint, site unspecified   . Palpitations   . CKD (chronic kidney disease)    Past Surgical History  Procedure Laterality Date  . Hemicolectomy  08/11/1997  . Kidney stone surgery    . Colon surgery      to remove colon ca  . Cholecystectomy  05/14/2012    Procedure: LAPAROSCOPIC CHOLECYSTECTOMY WITH INTRAOPERATIVE CHOLANGIOGRAM;  Surgeon: Haywood Lasso, MD;  Location: [facility];  Service: General;  Laterality: N/A;  . Ercp  05/17/2012    Procedure: ENDOSCOPIC RETROGRADE CHOLANGIOPANCREATOGRAPHY (ERCP);  Surgeon: Missy Sabins, MD;  Location: [facility];  Service: Gastroenterology;  Laterality: N/A;  . Ercp  08/12/2012    Procedure: ENDOSCOPIC RETROGRADE CHOLANGIOPANCREATOGRAPHY (ERCP);  Surgeon: Missy Sabins, MD;  Location: [facility] ENDOSCOPY;  Service: Endoscopy;  Laterality: N/A;  . Transthoracic echocardiogram  11/2009    EF 45-50%, mild-mod AV regurg; mild MR; mod TR; ascending aorta mildly dilated  . Cardiac catheterization  2008    moderate severe pulm HTN; with elevted pulm capillary wedge pressure    Family History  Problem Relation Age of Onset  . Heart disease Mother   . Stroke Father   .  Hypertension Daughter   . Hypertension Son   . Diabetes Sister   . Hypertension Son    History  Substance Use Topics  . Smoking status: Never Smoker   . Smokeless tobacco: Never Used  . Alcohol Use: No   OB History   Grav Para Term Preterm Abortions TAB SAB Ect Mult Living                 Review of Systems  All other systems negative except as documented in the HPI. All pertinent positives and negatives as reviewed in the HPI.  Allergies  Iohexol; Z-pak; and Iodinated diagnostic agents  Home Medications   Prior to Admission  medications   Medication Sig Start Date End Date Taking? Authorizing Provider  allopurinol (ZYLOPRIM) 100 MG tablet Take 1 tablet by mouth twice a day for CHRONIC GOUT 12/31/13  Yes [name] [name], DO  apixaban (ELIQUIS) 5 MG TABS tablet Take 1 tablet by mouth every 12 hours. 12/11/13  Yes Tommy Medal, RPH-CPP  diltiazem (CARTIA XT) 180 MG 24 hr capsule Take 1 capsule (180 mg total) by mouth daily. 11/25/13  Yes Pixie Casino, MD  furosemide (LASIX) 40 MG tablet take 1 tablet by mouth twice a day 05/18/13  Yes Pixie Casino, MD  levothyroxine (SYNTHROID, LEVOTHROID) 50 MCG tablet Take 1 tablet (50 mcg total) by mouth daily. 07/01/13  Yes Pricilla Larsson, NP  magnesium oxide (MAG-OX) 400 MG tablet Take one tablet by mouth three times daily for magnesium supplement 01/18/14  Yes [name] [name], DO  metFORMIN (GLUCOPHAGE) 500 MG tablet Take 1 tablet (500 mg total) by mouth 2 (two) times daily with a meal. 01/18/14  Yes [name] [name], DO  polyethylene glycol (MIRALAX / GLYCOLAX) packet Take 17 g by mouth daily as needed for mild constipation.    Yes Historical Provider, MD  potassium chloride SA (KLOR-CON M20) 20 MEQ tablet Take one tablet by mouth once daily 09/25/13  Yes [name] [name], DO  simvastatin (ZOCOR) 40 MG tablet Take one tablet by mouth once daily 12/25/13  Yes Pricilla Larsson, NP   BP 187/87  Pulse 90  Temp(Src) 97.5 F (36.4 C) (Oral)  Resp 18  Ht 5\' 10"  (1.778 m)  Wt 174 lb (78.926 kg)  BMI 24.97 kg/m2  SpO2 96% Physical Exam  Nursing note and vitals reviewed. Constitutional: She is oriented to person, place, and time. She appears well-developed and well-nourished.  HENT:  Head: Normocephalic and atraumatic.  Mouth/Throat: Oropharynx is clear and moist.  Eyes: Pupils are equal, round, and reactive to light.  Neck: Normal range of motion. Neck supple.  Cardiovascular: Normal rate and normal heart sounds.  An irregularly irregular rhythm present. Exam reveals no gallop and no  friction rub.   No murmur heard. Pulmonary/Chest: Effort normal and breath sounds normal. No respiratory distress.  Abdominal: Normal appearance and bowel sounds are normal. She exhibits no distension. There is generalized tenderness. There is no rigidity, no rebound, no guarding and no CVA tenderness. No hernia.    Neurological: She is alert and oriented to person, place, and time. She exhibits normal muscle tone. Coordination normal.  Skin: Skin is warm and dry. No rash noted. No erythema.    ED Course  Procedures (including critical care time) Labs Review Labs Reviewed  COMPREHENSIVE METABOLIC PANEL - Abnormal; Notable for the following:    Glucose, Bld 174 (*)    Creatinine, Ser 1.35 (*)    GFR calc  non Af Amer 36 (*)    GFR calc Af Amer 42 (*)    All other components within normal limits  CBC WITH DIFFERENTIAL - Abnormal; Notable for the following:    WBC 11.1 (*)    MCHC 36.3 (*)    Neutrophils Relative % 79 (*)    Neutro Abs 8.8 (*)    All other components within normal limits  URINALYSIS, ROUTINE W REFLEX MICROSCOPIC - Abnormal; Notable for the following:    Glucose, UA 100 (*)    Hgb urine dipstick TRACE (*)    Ketones, ur 15 (*)    Protein, ur 100 (*)    All other components within normal limits  URINE MICROSCOPIC-ADD ON - Abnormal; Notable for the following:    Squamous Epithelial / LPF FEW (*)    All other components within normal limits  I-STAT TROPOININ, ED  I-STAT CG4 LACTIC ACID, ED    Imaging Review Ct Abdomen Pelvis Wo Contrast  01/23/2014   CLINICAL DATA:  Epigastric pain and nausea. History of bowel resection for colon cancer.  EXAM: CT ABDOMEN AND PELVIS WITHOUT CONTRAST  TECHNIQUE: Multidetector CT imaging of the abdomen and pelvis was performed following the standard protocol without IV contrast.  COMPARISON:  CT abdomen and pelvis with contrast 02/09/2013.  FINDINGS: Mild dependent atelectasis is present at the lung bases bilaterally. No focal nodule,  mass, or airspace disease is present. The heart is mildly enlarged without significant change. Atherosclerotic calcifications are present in the aorta without aneurysm.  Minimal fluid is present around the liver. The liver is otherwise unremarkable. Patient is status post splenectomy. Multiple soft tissue nodules are stable, reflecting residual splenules. Contrast is present within the stomach. The duodenum is unremarkable. The pancreas is within normal limits. There is significant interval enlargement of the common bile duct, now measuring 2.4 cm. No obstructing mass is evident. The adrenal glands are normal bilaterally. Renal atrophy is stable. The ureters are within normal limits.  The rectosigmoid colon is within normal limits. The left lower quadrant anastomosis is intact. The more proximal colon is within normal limits as well. The appendix is visualized and normal.  Dilated loops of small bowel are present in the mid abdomen with gradual dilution of contrast. The transition point is in the low anterior pelvis appears to be associated with adhesions. No obstructing mass lesion is present. There is no free fluid in the pelvis. The patient is status post hysterectomy. The ovaries are visualized and appear to be within normal limits.  The bone windows demonstrate partial fusion along the SI joints bilaterally. Mild degenerative changes are noted in the lumbar spine. No focal lytic or blastic lesions are evident.  IMPRESSION: 1. Partial small bowel obstruction with transition point in the mid lower abdomen likely related to adhesions. 2. Stable appearance of the chronic CT anastomosis. 3. Atherosclerosis. 4. Significant interval increase and the size the common bile duct without an obstructing lesion. 5. Mild cardiomegaly without failure.   Electronically Signed   By: Lawrence Santiago M.D.   On: 01/23/2014 12:10   Spoke with General Surgery and [facility]. The patient will be admitted to the hospital.  Patient has been stable here in the ER.  The patient has been seen by the attending Physician.   Brent General, PA-C 01/23/14 1322

## 2014-01-24 ENCOUNTER — Inpatient Hospital Stay (HOSPITAL_COMMUNITY): Payer: Medicare Other

## 2014-01-24 DIAGNOSIS — I5022 Chronic systolic (congestive) heart failure: Secondary | ICD-10-CM

## 2014-01-24 DIAGNOSIS — I509 Heart failure, unspecified: Secondary | ICD-10-CM

## 2014-01-24 LAB — CBC
HEMATOCRIT: 40.1 % (ref 36.0–46.0)
Hemoglobin: 14.2 g/dL (ref 12.0–15.0)
MCH: 29.4 pg (ref 26.0–34.0)
MCHC: 35.4 g/dL (ref 30.0–36.0)
MCV: 83 fL (ref 78.0–100.0)
Platelets: 285 10*3/uL (ref 150–400)
RBC: 4.83 MIL/uL (ref 3.87–5.11)
RDW: 15 % (ref 11.5–15.5)
WBC: 12.3 10*3/uL — ABNORMAL HIGH (ref 4.0–10.5)

## 2014-01-24 LAB — APTT
aPTT: 34 seconds (ref 24–37)
aPTT: 78 seconds — ABNORMAL HIGH (ref 24–37)

## 2014-01-24 LAB — GLUCOSE, CAPILLARY
GLUCOSE-CAPILLARY: 128 mg/dL — AB (ref 70–99)
Glucose-Capillary: 183 mg/dL — ABNORMAL HIGH (ref 70–99)
Glucose-Capillary: 159 mg/dL — ABNORMAL HIGH (ref 70–99)
Glucose-Capillary: 145 mg/dL — ABNORMAL HIGH (ref 70–99)
Glucose-Capillary: 130 mg/dL — ABNORMAL HIGH (ref 70–99)
Glucose-Capillary: 131 mg/dL — ABNORMAL HIGH (ref 70–99)

## 2014-01-24 LAB — COMPREHENSIVE METABOLIC PANEL
ALT: 12 U/L (ref 0–35)
ANION GAP: 16 — AB (ref 5–15)
AST: 19 U/L (ref 0–37)
Albumin: 3.6 g/dL (ref 3.5–5.2)
Alkaline Phosphatase: 91 U/L (ref 39–117)
BILIRUBIN TOTAL: 0.8 mg/dL (ref 0.3–1.2)
BUN: 19 mg/dL (ref 6–23)
CHLORIDE: 100 meq/L (ref 96–112)
CO2: 27 mEq/L (ref 19–32)
CREATININE: 1.21 mg/dL — AB (ref 0.50–1.10)
Calcium: 9.5 mg/dL (ref 8.4–10.5)
GFR calc Af Amer: 48 mL/min — ABNORMAL LOW (ref >90)
GFR calc non Af Amer: 41 mL/min — ABNORMAL LOW (ref >90)
Glucose, Bld: 175 mg/dL — ABNORMAL HIGH (ref 70–99)
Potassium: 3.8 mEq/L (ref 3.7–5.3)
Sodium: 143 mEq/L (ref 137–147)
TOTAL PROTEIN: 7.5 g/dL (ref 6.0–8.3)

## 2014-01-24 LAB — HEPARIN LEVEL (UNFRACTIONATED)
Heparin Unfractionated: 1.08 IU/mL — ABNORMAL HIGH (ref 0.30–0.70)
Heparin Unfractionated: 1.03 IU/mL — ABNORMAL HIGH (ref 0.30–0.70)

## 2014-01-24 LAB — PROTIME-INR
INR: 1.13 (ref 0.00–1.49)
PROTHROMBIN TIME: 14.5 s (ref 11.6–15.2)

## 2014-01-24 MED ORDER — METOPROLOL TARTRATE 1 MG/ML IV SOLN: 5.0000 mg | INTRAVENOUS | Status: DC

## 2014-01-24 MED ORDER — HEPARIN (PORCINE) IN NACL 100-0.45 UNIT/ML-% IJ SOLN
1200.0000 [IU]/h | INTRAMUSCULAR | Status: DC
  Administered 2014-01-24 – 2014-01-26 (×2): 1100 [IU]/h via INTRAVENOUS
  Administered 2014-01-27 – 2014-01-28 (×2): 1200 [IU]/h via INTRAVENOUS
  Filled 2014-01-24 (×9): qty 250

## 2014-01-24 MED ORDER — DILTIAZEM HCL 100 MG IV SOLR
5.0000 mg/h | INTRAVENOUS | Status: AC
  Administered 2014-01-24: 10 mg/h via INTRAVENOUS
  Administered 2014-01-24: 5 mg/h via INTRAVENOUS
  Administered 2014-01-24 – 2014-01-29 (×9): 10 mg/h via INTRAVENOUS
  Filled 2014-01-24 (×2): qty 100

## 2014-01-24 MED ORDER — METOPROLOL TARTRATE 1 MG/ML IV SOLN
5.0000 mg | INTRAVENOUS | Status: DC | PRN
  Filled 2014-01-24: qty 5

## 2014-01-24 MED ORDER — PHENOL 1.4 % MT LIQD
1.0000 | OROMUCOSAL | Status: DC | PRN
  Administered 2014-01-24: 1 via OROMUCOSAL
  Filled 2014-01-24: qty 177

## 2014-01-24 MED ORDER — DILTIAZEM LOAD VIA INFUSION
10.0000 mg | Freq: Once | INTRAVENOUS | Status: AC
  Administered 2014-01-24: 10 mg via INTRAVENOUS
  Filled 2014-01-24: qty 10

## 2014-01-24 MED ORDER — HYDROCORTISONE 1 % EX CREA
TOPICAL_CREAM | Freq: Three times a day (TID) | CUTANEOUS | Status: DC | PRN
  Filled 2014-01-24: qty 28

## 2014-01-24 NOTE — Progress Notes (Signed)
Report called to receiving nurse, Alyse Low RN on 3S.

## 2014-01-24 NOTE — Progress Notes (Signed)
Informed by RN that patient in Afluter/Afib with RVR, already getting IV Lopressor scheduled, but heart rate not optimally controlled. Patient essentially asymptomatic. Will start Cardizem gtt and move to step down unit.

## 2014-01-24 NOTE — Progress Notes (Signed)
ANTICOAGULATION CONSULT NOTE - Initial Consult  Pharmacy Consult for Heparin Indication: atrial fibrillation  Allergies  Allergen Reactions  . Iohexol      Code: VOM, Desc: PT REPORTS VOMITING W/ IVP DYE- ARS 12/26/08, Onset Date: [id number]   . Z-Pak [Azithromycin]   . Iodinated Diagnostic Agents Nausea Only    PT HAS HAD SINGULAR INCIDENT OF NAUSEA  W/ IV CONTYRAST INJECTION, NONE IF INJECTED SLOWLY//A.[place]    Patient Measurements: Height: 5\' 10"  (177.8 cm) Weight: 178 lb 2.1 oz (80.8 kg) IBW/kg (Calculated) : 68.5 Heparin Dosing Weight: 80 kg  Vital Signs: Temp: 98.1 F (36.7 C) (07/05 0528) Temp src: Oral (07/05 0528) BP: 168/107 mmHg (07/05 0528) Pulse Rate: 96 (07/05 0528)  Labs:  Recent Labs  01/23/14 0632 01/24/14 0445  HGB 14.5 14.2  HCT 39.9 40.1  PLT 271 285  LABPROT  --  14.5  INR  --  1.13  CREATININE 1.35* 1.21*    Estimated Creatinine Clearance: 40.1 ml/min (by C-G formula based on Cr of 1.21).   Medical History: Past Medical History  Diagnosis Date  . Colon cancer 07/1997  . Hypertension   . Diabetes mellitus   . CHF (congestive heart failure)   . AAA (abdominal aortic aneurysm)   . Atrial fibrillation   . Hypercholesteremia   . Kidney stone     renal calculi  . Small bowel obstruction due to adhesions 05/16/2012  . Hypothyroidism   . Arthritis   . Anemia   . Cholelithiasis   . Perforation of bile duct   . Hemorrhage of rectum and anus   . Swelling, mass, or lump in head and neck   . Cervicalgia   . Hypoglycemia, unspecified   . Pain in joint, lower leg   . Anxiety   . Shortness of breath   . Acute upper respiratory infections of unspecified site   . Depression   . Thoracic aneurysm without mention of rupture   . GERD (gastroesophageal reflux disease)   . Proteinuria   . Other specified cardiac dysrhythmias(427.89)   . Congestive heart failure, unspecified   . Electrolyte and fluid disorders not elsewhere classified   .  Other chronic allergic conjunctivitis   . Hypopotassemia   . Chronic systolic heart failure   . Dizziness and giddiness   . Malignant neoplasm of colon, unspecified site   . Other and unspecified hyperlipidemia   . Gout, unspecified   . Atrial fibrillation   . Aortic aneurysm of unspecified site without mention of rupture   . Pain in joint, site unspecified   . Palpitations   . CKD (chronic kidney disease)     Medications:  Prescriptions prior to admission  Medication Sig Dispense Refill  . allopurinol (ZYLOPRIM) 100 MG tablet Take 1 tablet by mouth twice a day for CHRONIC GOUT  60 tablet  2  . apixaban (ELIQUIS) 5 MG TABS tablet Take 1 tablet by mouth every 12 hours.  56 tablet  0  . diltiazem (CARTIA XT) 180 MG 24 hr capsule Take 1 capsule (180 mg total) by mouth daily.  30 capsule  9  . furosemide (LASIX) 40 MG tablet take 1 tablet by mouth twice a day  60 tablet  6  . levothyroxine (SYNTHROID, LEVOTHROID) 50 MCG tablet Take 1 tablet (50 mcg total) by mouth daily.  90 tablet  3  . magnesium oxide (MAG-OX) 400 MG tablet Take one tablet by mouth three times daily for magnesium supplement  90 tablet  5  . metFORMIN (GLUCOPHAGE) 500 MG tablet Take 1 tablet (500 mg total) by mouth 2 (two) times daily with a meal.  60 tablet  5  . polyethylene glycol (MIRALAX / GLYCOLAX) packet Take 17 g by mouth daily as needed for mild constipation.       . potassium chloride SA (KLOR-CON M20) 20 MEQ tablet Take one tablet by mouth once daily  30 tablet  3  . simvastatin (ZOCOR) 40 MG tablet Take one tablet by mouth once daily  30 tablet  5    Assessment: 80 yo F on Eliquis PTA for hx afib/flutter now oh hold secondary to NPO status with SBO.  Last Eliquis dose was 7/3 PM.  Eliquis can also elevate the Anti-Xa (heparin level) that we use for monitoring Heparin infusions.  Will need to obtain baseline Anti-Xa level and until Eliquis is completely cleared, will need to dose heparin based on aPTT  goals.  Noted MDs desire to maintain patient at lower end of therapeutic goal range.  Goal of Therapy:  Heparin level 0.3-0.7 units/ml -- target lower end of goal range aPTT 66-102 seconds -- target lower end of goal range Monitor platelets by anticoagulation protocol: Yes   Plan:  Baseline Anti-Xa level and aPTT now. Begin Heparin infusion at 1100 units/hr.  NO BOLUS. Anti-Xa level and aPTT in 8 hours. Daily Anti-Xa level, aPTT, and CBC.  [employer], Pharm.D., BCPS Clinical Pharmacist Pager [phone] 01/24/2014 12:10 PM

## 2014-01-24 NOTE — Progress Notes (Signed)
PATIENT DETAILS Name: Nicole [name] Age: 80 y.o. Sex: female Date of Birth: 06-Nov-1930 Admit Date: 01/23/2014 Admitting Physician Domenic Polite, MD [id number], [name], DO  Subjective: No major complaints, No BM/Flatus yet  Assessment/Plan: Principal Problem:   SBO (small bowel obstruction) -likely secondary to adhesions -admitted kept NPO, NGT placed for decompression. Not much improvement yet -Will continue supportive care, appreciate CCS input  Active Problems: Chronic Systolic CHF -clinically compensaated -watch closely while on IVF, lasix remains on hold  Type II diabetes mellitus  -CBG's stable, hold metformin. Continue SSI  Hypothyroidism -c/w IV Levothyroxine  Afib -tele-shows aflutter -cardizem on hold as NPO, continue with IV Lopressor -will start IV Heparin per pharmacy-no Bolus-as Eliquis on hold  Disposition: Remain inpatient  DVT Prophylaxis: Not needed as on Heparin infusion  Code Status: Full code   Family Communication None at bedside  Procedures:  None  CONSULTS:  general surgery  Time spent 40 minutes-which includes 50% of the time with face-to-face with patient/ family and coordinating care related to the above assessment and plan.    MEDICATIONS: Scheduled Meds: . antiseptic oral rinse  15 mL Mouth Rinse q12n4p  . chlorhexidine  15 mL Mouth Rinse BID  . insulin aspart  0-9 Units Subcutaneous 6 times per day  . levothyroxine  25 mcg Intravenous Daily  . metoprolol  5 mg Intravenous 4 times per day   Continuous Infusions: . sodium chloride 75 mL/hr at 01/24/14 0401   PRN Meds:.acetaminophen, hydrALAZINE, hydrocortisone cream, morphine injection, ondansetron (ZOFRAN) IV, phenol  Antibiotics: Anti-infectives   None       PHYSICAL EXAM: Vital signs in last 24 hours: Filed Vitals:   01/23/14 1808 01/23/14 1810 01/23/14 2130 01/24/14 0528  BP: 179/89  190/96 168/107  Pulse: 114 108 119 96  Temp:   97.7 F  (36.5 C) 98.1 F (36.7 C)  TempSrc:   Oral Oral  Resp:   17 17  Height:      Weight:    80.8 kg (178 lb 2.1 oz)  SpO2:   92% 94%    Weight change: 1.174 kg (2 lb 9.4 oz) Filed Weights   01/23/14 0620 01/23/14 1440 01/24/14 0528  Weight: 78.926 kg (174 lb) 80.1 kg (176 lb 9.4 oz) 80.8 kg (178 lb 2.1 oz)   Body mass index is 25.56 kg/(m^2).   Gen Exam: Awake and alert with clear speech.   Neck: Supple, No JVD.   Chest: B/L Clear.   CVS: S1 S2 Regular, no murmurs.  Abdomen: soft, BS +, non tender, non distended.  Extremities: no edema, lower extremities warm to touch. Neurologic: Non Focal.   Skin: No Rash.   Wounds: N/A.    Intake/Output from previous day:  Intake/Output Summary (Last 24 hours) at 01/24/14 0959 Last data filed at 01/24/14 0920  Gross per 24 hour  Intake 1068.75 ml  Output    750 ml  Net 318.75 ml     LAB RESULTS: CBC  Recent Labs Lab 01/23/14 0632 01/24/14 0445  WBC 11.1* 12.3*  HGB 14.5 14.2  HCT 39.9 40.1  PLT 271 285  MCV 82.4 83.0  MCH 30.0 29.4  MCHC 36.3* 35.4  RDW 14.9 15.0  LYMPHSABS 1.7  --   MONOABS 0.6  --   EOSABS 0.0  --   BASOSABS 0.0  --     Chemistries   Recent Labs Lab 01/23/14 0632 01/24/14 0445  NA 142 143  K  4.0 3.8  CL 99 100  CO2 28 27  GLUCOSE 174* 175*  BUN 18 19  CREATININE 1.35* 1.21*  CALCIUM 9.8 9.5    CBG:  Recent Labs Lab 01/23/14 1640 01/23/14 2128 01/24/14 0139 01/24/14 0525 01/24/14 0755  GLUCAP 190* 228* 183* 159* 145*    GFR Estimated Creatinine Clearance: 40.1 ml/min (by C-G formula based on Cr of 1.21).  Coagulation profile  Recent Labs Lab 01/24/14 0445  INR 1.13    Cardiac Enzymes No results found for this basename: CK, CKMB, TROPONINI, MYOGLOBIN,  in the last 168 hours  No components found with this basename: POCBNP,  No results found for this basename: DDIMER,  in the last 72 hours  Recent Labs  01/23/14 0632  HGBA1C 5.5   No results found for this  basename: CHOL, HDL, LDLCALC, TRIG, CHOLHDL, LDLDIRECT,  in the last 72 hours No results found for this basename: TSH, T4TOTAL, FREET3, T3FREE, THYROIDAB,  in the last 72 hours No results found for this basename: VITAMINB12, FOLATE, FERRITIN, TIBC, IRON, RETICCTPCT,  in the last 72 hours No results found for this basename: LIPASE, AMYLASE,  in the last 72 hours  Urine Studies No results found for this basename: UACOL, UAPR, USPG, UPH, UTP, UGL, UKET, UBIL, UHGB, UNIT, UROB, ULEU, UEPI, UWBC, URBC, UBAC, CAST, CRYS, UCOM, BILUA,  in the last 72 hours  MICROBIOLOGY: No results found for this or any previous visit (from the past 240 hour(s)).  RADIOLOGY STUDIES/RESULTS: Ct Abdomen Pelvis Wo Contrast  01/23/2014   CLINICAL DATA:  Epigastric pain and nausea. History of bowel resection for colon cancer.  EXAM: CT ABDOMEN AND PELVIS WITHOUT CONTRAST  TECHNIQUE: Multidetector CT imaging of the abdomen and pelvis was performed following the standard protocol without IV contrast.  COMPARISON:  CT abdomen and pelvis with contrast 02/09/2013.  FINDINGS: Mild dependent atelectasis is present at the lung bases bilaterally. No focal nodule, mass, or airspace disease is present. The heart is mildly enlarged without significant change. Atherosclerotic calcifications are present in the aorta without aneurysm.  Minimal fluid is present around the liver. The liver is otherwise unremarkable. Patient is status post splenectomy. Multiple soft tissue nodules are stable, reflecting residual splenules. Contrast is present within the stomach. The duodenum is unremarkable. The pancreas is within normal limits. There is significant interval enlargement of the common bile duct, now measuring 2.4 cm. No obstructing mass is evident. The adrenal glands are normal bilaterally. Renal atrophy is stable. The ureters are within normal limits.  The rectosigmoid colon is within normal limits. The left lower quadrant anastomosis is intact. The  more proximal colon is within normal limits as well. The appendix is visualized and normal.  Dilated loops of small bowel are present in the mid abdomen with gradual dilution of contrast. The transition point is in the low anterior pelvis appears to be associated with adhesions. No obstructing mass lesion is present. There is no free fluid in the pelvis. The patient is status post hysterectomy. The ovaries are visualized and appear to be within normal limits.  The bone windows demonstrate partial fusion along the SI joints bilaterally. Mild degenerative changes are noted in the lumbar spine. No focal lytic or blastic lesions are evident.  IMPRESSION: 1. Partial small bowel obstruction with transition point in the mid lower abdomen likely related to adhesions. 2. Stable appearance of the chronic CT anastomosis. 3. Atherosclerosis. 4. Significant interval increase and the size the common bile duct without an obstructing lesion.  5. Mild cardiomegaly without failure.   Electronically Signed   By: Lawrence Santiago M.D.   On: 01/23/2014 12:10    Oren Binet, MD  [facility] Pager:[phone]  If 7PM-7AM, please contact night-coverage [email or web address] Password [email or web address] 01/24/2014, 9:59 AM   LOS: 1 day   **Disclaimer: This note may have been dictated with voice recognition software. Similar sounding words can inadvertently be transcribed and this note may contain transcription errors which may not have been corrected upon publication of note.**

## 2014-01-24 NOTE — Progress Notes (Signed)
Patient ID: Nicole [name], female   DOB: 10/14/1930, 80 y.o.   MRN: [id number]  General Surgery - [facility] - Progress Note  HD# 2  Subjective: Patient without complaints this AM.  No significant pain.  Denies flatus or BM.  Throat pain from NG tube.  Objective: Vital signs in last 24 hours: Temp:  [97.5 F (36.4 C)-98.1 F (36.7 C)] 98.1 F (36.7 C) (07/05 0528) Pulse Rate:  [76-119] 96 (07/05 0528) Resp:  [11-21] 17 (07/05 0528) BP: (140-202)/(85-159) 168/107 mmHg (07/05 0528) SpO2:  [91 %-99 %] 94 % (07/05 0528) Weight:  [176 lb 9.4 oz (80.1 kg)-178 lb 2.1 oz (80.8 kg)] 178 lb 2.1 oz (80.8 kg) (07/05 0528) Last BM Date: 01/21/14  Intake/Output from previous day: 07/04 0701 - 07/05 0700 In: 2068.8 [I.V.:2068.8] Out: 750 [Emesis/NG output:750]  Exam: HEENT - clear, not icteric Neck - soft Chest - clear bilaterally Cor - RRR, no murmur Abd - soft, protuberant, moderate distension; BS present; well-healed midline wound; no significant tenderness Ext - no significant edema Neuro - grossly intact, no focal deficits  Lab Results:   Recent Labs  01/23/14 0632 01/24/14 0445  WBC 11.1* 12.3*  HGB 14.5 14.2  HCT 39.9 40.1  PLT 271 285     Recent Labs  01/23/14 0632 01/24/14 0445  NA 142 143  K 4.0 3.8  CL 99 100  CO2 28 27  GLUCOSE 174* 175*  BUN 18 19  CREATININE 1.35* 1.21*  CALCIUM 9.8 9.5    Studies/Results: Ct Abdomen Pelvis Wo Contrast  01/23/2014   CLINICAL DATA:  Epigastric pain and nausea. History of bowel resection for colon cancer.  EXAM: CT ABDOMEN AND PELVIS WITHOUT CONTRAST  TECHNIQUE: Multidetector CT imaging of the abdomen and pelvis was performed following the standard protocol without IV contrast.  COMPARISON:  CT abdomen and pelvis with contrast 02/09/2013.  FINDINGS: Mild dependent atelectasis is present at the lung bases bilaterally. No focal nodule, mass, or airspace disease is present. The heart is mildly enlarged  without significant change. Atherosclerotic calcifications are present in the aorta without aneurysm.  Minimal fluid is present around the liver. The liver is otherwise unremarkable. Patient is status post splenectomy. Multiple soft tissue nodules are stable, reflecting residual splenules. Contrast is present within the stomach. The duodenum is unremarkable. The pancreas is within normal limits. There is significant interval enlargement of the common bile duct, now measuring 2.4 cm. No obstructing mass is evident. The adrenal glands are normal bilaterally. Renal atrophy is stable. The ureters are within normal limits.  The rectosigmoid colon is within normal limits. The left lower quadrant anastomosis is intact. The more proximal colon is within normal limits as well. The appendix is visualized and normal.  Dilated loops of small bowel are present in the mid abdomen with gradual dilution of contrast. The transition point is in the low anterior pelvis appears to be associated with adhesions. No obstructing mass lesion is present. There is no free fluid in the pelvis. The patient is status post hysterectomy. The ovaries are visualized and appear to be within normal limits.  The bone windows demonstrate partial fusion along the SI joints bilaterally. Mild degenerative changes are noted in the lumbar spine. No focal lytic or blastic lesions are evident.  IMPRESSION: 1. Partial small bowel obstruction with transition point in the mid lower abdomen likely related to adhesions. 2. Stable appearance of the chronic CT anastomosis. 3. Atherosclerosis. 4. Significant interval increase and the  size the common bile duct without an obstructing lesion. 5. Mild cardiomegaly without failure.   Electronically Signed   By: Lawrence Santiago M.D.   On: 01/23/2014 12:10    Assessment / Plan: 1.  Small bowel obstruction, likely secondary to adhesions  Continue NPO, IVF (may want to increase rate - per medicine)  Chloraseptic spray  prn  AXR pending this AM  Encouraged OOB, ambulation, IS use  Will follow  Earnstine Regal, MD, [facility] Office: [phone]  01/24/2014

## 2014-01-24 NOTE — Progress Notes (Signed)
ANTICOAGULATION CONSULT NOTE - Follow Up Consult  Pharmacy Consult for heparin Indication: atrial fibrillation  Labs:  Recent Labs  01/23/14 0632 01/24/14 0445 01/24/14 1240 01/24/14 2200  HGB 14.5 14.2  --   --   HCT 39.9 40.1  --   --   PLT 271 285  --   --   APTT  --   --  34 78*  LABPROT  --  14.5  --   --   INR  --  1.13  --   --   HEPARINUNFRC  --   --  1.08* 1.03*  CREATININE 1.35* 1.21*  --   --     Assessment/Plan:  80yo female therapeutic on heparin with initial dosing for Afib using PTT while Eliquis effect is clearing. Will continue gtt at current rate and confirm stable with am labs.   Wynona Neat, PharmD, BCPS  01/24/2014,11:02 PM

## 2014-01-25 ENCOUNTER — Inpatient Hospital Stay (HOSPITAL_COMMUNITY): Payer: Medicare Other

## 2014-01-25 LAB — BASIC METABOLIC PANEL
Anion gap: 14 (ref 5–15)
BUN: 15 mg/dL (ref 6–23)
CALCIUM: 8.7 mg/dL (ref 8.4–10.5)
CO2: 26 mEq/L (ref 19–32)
Chloride: 104 mEq/L (ref 96–112)
Creatinine, Ser: 1.06 mg/dL (ref 0.50–1.10)
GFR calc Af Amer: 56 mL/min — ABNORMAL LOW (ref >90)
GFR, EST NON AFRICAN AMERICAN: 48 mL/min — AB (ref >90)
Glucose, Bld: 123 mg/dL — ABNORMAL HIGH (ref 70–99)
Potassium: 3.2 mEq/L — ABNORMAL LOW (ref 3.7–5.3)
Sodium: 144 mEq/L (ref 137–147)

## 2014-01-25 LAB — CBC
HEMATOCRIT: 35.3 % — AB (ref 36.0–46.0)
HEMOGLOBIN: 12.3 g/dL (ref 12.0–15.0)
MCH: 29.1 pg (ref 26.0–34.0)
MCHC: 34.8 g/dL (ref 30.0–36.0)
MCV: 83.5 fL (ref 78.0–100.0)
Platelets: 252 10*3/uL (ref 150–400)
RBC: 4.23 MIL/uL (ref 3.87–5.11)
RDW: 15 % (ref 11.5–15.5)
WBC: 7.2 10*3/uL (ref 4.0–10.5)

## 2014-01-25 LAB — GLUCOSE, CAPILLARY
GLUCOSE-CAPILLARY: 114 mg/dL — AB (ref 70–99)
GLUCOSE-CAPILLARY: 131 mg/dL — AB (ref 70–99)
GLUCOSE-CAPILLARY: 155 mg/dL — AB (ref 70–99)
Glucose-Capillary: 124 mg/dL — ABNORMAL HIGH (ref 70–99)
Glucose-Capillary: 147 mg/dL — ABNORMAL HIGH (ref 70–99)

## 2014-01-25 LAB — APTT: aPTT: 80 seconds — ABNORMAL HIGH (ref 24–37)

## 2014-01-25 LAB — HEPARIN LEVEL (UNFRACTIONATED): Heparin Unfractionated: 0.86 IU/mL — ABNORMAL HIGH (ref 0.30–0.70)

## 2014-01-25 MED ORDER — METOPROLOL TARTRATE 1 MG/ML IV SOLN
10.0000 mg | Freq: Four times a day (QID) | INTRAVENOUS | Status: AC
  Administered 2014-01-25 – 2014-01-29 (×17): 10 mg via INTRAVENOUS
  Filled 2014-01-25 (×19): qty 10

## 2014-01-25 MED ORDER — POTASSIUM CHLORIDE 10 MEQ/100ML IV SOLN
10.0000 meq | INTRAVENOUS | Status: AC
  Administered 2014-01-25 (×4): 10 meq via INTRAVENOUS
  Filled 2014-01-25 (×2): qty 100

## 2014-01-25 MED ORDER — METOPROLOL TARTRATE 1 MG/ML IV SOLN: 5.0000 mg | Freq: Four times a day (QID) | INTRAVENOUS | Status: DC

## 2014-01-25 NOTE — Progress Notes (Signed)
[facility] TEAM 1 - Stepdown/ICU TEAM Progress Note  NAO LINZ VZC:[id number] DOB: 02-Jan-1931 DOA: 01/23/2014 PCP: Hollace Kinnier, DO  Admit HPI / Brief Narrative: 80 y.o. female with PMH of chronic systolic CHF EF of 12%, DM, CKD, Chronic afib on apixaban, h/o cholecystectomy (10/13) and surgery for COlon CA in '99 who presented to the ER with the abdom pain assoc w/ N/V. She'd had intermittent abdominal pain on and off for about 1 month, but worsened the day prior to her admit, and became accompanied by nausea and vomiting. Due to persistence of symptoms she presented to the ER.  In ER, noted to have Abd distension, tenderness and CT with PSBO with transition point.  HPI/Subjective: Pt has some self limited high left chest pain this morning.  Sx now resolved.  EKG obtained during pain reviewed.  No complaints at present, other than wanting to eat.  Denies cp, n/v, or abdom pain.    Assessment/Plan:  Small bowel obstruction -likely secondary to adhesions  -NPO, NGT for decompression -improved per KUB - passing some flatus  -ongoing care per Gen Surgery   Chronic Afib / Aflutter w/ acute RVR -tele-shows aflutter  -oral cardizem on hold as NPO -  IV Lopressor + cardizem gtt -IV Heparin per pharmacy - Eliquis on hold   Hypokalemia -replete w/ goal of 4.0 or > to maximize smooth muscle contractility   Chronic Systolic CHF - recent EF 45- 50% -clinically compensaated  -watch closely while on IVF, lasix remains on hold as pt NPO  Type II diabetes mellitus  -CBG's stable - continue SSI   Uncontrolled HTN -add scheduled BB - adjust medical tx and follow   Known thoracic aortic aneurysm   Hx colon CA s/p resection '99  Hypothyroidism  -c/w IV Levothyroxine  CKD 3  -stable  Code Status: FULL Family Communication: spoke w/ sister at bedside  Disposition Plan: SDU  Consultants: Gen Surgery   Procedures: none  Antibiotics: none  DVT prophylaxis: IV heparin    Objective: Blood pressure 158/91, pulse 98, temperature 98.7 F (37.1 C), temperature source Oral, resp. rate 18, height 5\' 10"  (1.778 m), weight 79.9 kg (176 lb 2.4 oz), SpO2 97.00%.  Intake/Output Summary (Last 24 hours) at 01/25/14 1509 Last data filed at 01/25/14 1500  Gross per 24 hour  Intake   3418 ml  Output   2225 ml  Net   1193 ml   Exam: General: No acute respiratory distress Lungs: Clear to auscultation bilaterally without wheezes or crackles Cardiovascular: irreg irreg - rate ~110 - no appreciable M or rub  Abdomen: Nontender, distended, soft, bowel sounds not appreciated, no rebound, no ascites, no appreciable mass Extremities: No significant cyanosis, clubbing, or edema bilateral lower extremities  Data Reviewed: Basic Metabolic Panel:  Recent Labs Lab 01/23/14 0632 01/24/14 0445 01/25/14 0250  NA 142 143 144  K 4.0 3.8 3.2*  CL 99 100 104  CO2 28 27 26   GLUCOSE 174* 175* 123*  BUN 18 19 15   CREATININE 1.35* 1.21* 1.06  CALCIUM 9.8 9.5 8.7   Liver Function Tests:  Recent Labs Lab 01/23/14 0632 01/24/14 0445  AST 23 19  ALT 10 12  ALKPHOS 92 91  BILITOT 0.8 0.8  PROT 7.9 7.5  ALBUMIN 4.1 3.6   CBC:  Recent Labs Lab 01/23/14 0632 01/24/14 0445 01/25/14 0250  WBC 11.1* 12.3* 7.2  NEUTROABS 8.8*  --   --   HGB 14.5 14.2 12.3  HCT 39.9 40.1  35.3*  MCV 82.4 83.0 83.5  PLT 271 285 252   CBG:  Recent Labs Lab 01/24/14 1936 01/24/14 2334 01/25/14 0339 01/25/14 0738 01/25/14 1130  GLUCAP 131* 124* 114* 147* 131*    Studies:  Recent x-ray studies have been reviewed in detail by the Attending Physician  Scheduled Meds:  Scheduled Meds: . antiseptic oral rinse  15 mL Mouth Rinse q12n4p  . chlorhexidine  15 mL Mouth Rinse BID  . insulin aspart  0-9 Units Subcutaneous 6 times per day  . levothyroxine  25 mcg Intravenous Daily    Time spent on care of this patient: 35 mins   Yuvan Medinger T , MD   [facility] Office  [phone] Pager - Text Page per Shea Evans as per below:  On-Call/Text Page:      [email or web address]      password [email or web address]  If 7PM-7AM, please contact night-coverage [email or web address] Password [email or web address] 01/25/2014, 3:09 PM   LOS: 2 days

## 2014-01-25 NOTE — Progress Notes (Signed)
Reports some flatus Plain films noticeably improved Abdomen less distended.  Continue NG for now.  Imogene Burn. Georgette Dover, MD, [facility]  General/ Trauma Surgery  01/25/2014 2:19 PM

## 2014-01-25 NOTE — Progress Notes (Signed)
Pt c/o left-sided chest pressure Thereasa Solo, MD notified - requested stat ekg.  Pt states that she's had this in "the past" and it "usually goes away after a bit".    EKG completed per order.  Pt up to chair - states pain is "going away" - continues watching tv - no other c/o pain or discomfort.  Will continue to monitor.

## 2014-01-25 NOTE — Progress Notes (Signed)
Patient ID: Nicole [name], female   DOB: 11/24/1930, 80 y.o.   MRN: [id number]    Subjective: Pt feels ok this morning.  Has some upper abdominal soreness.  Passing some flatus.  Objective: Vital signs in last 24 hours: Temp:  [97.8 F (36.6 C)-98.8 F (37.1 C)] 98.3 F (36.8 C) (07/06 0700) Pulse Rate:  [55-122] 82 (07/06 0900) Resp:  [11-29] 17 (07/06 0900) BP: (150-184)/(68-113) 165/93 mmHg (07/06 0900) SpO2:  [90 %-96 %] 95 % (07/06 0900) Weight:  [176 lb 2.4 oz (79.9 kg)] 176 lb 2.4 oz (79.9 kg) (07/06 0300) Last BM Date: 01/21/14  Intake/Output from previous day: 07/05 0701 - 07/06 0700 In: 2917.5 [I.V.:2887.5; NG/GT:30] Out: 1275 [Urine:675; Emesis/NG output:600] Intake/Output this shift: Total I/O In: 146 [I.V.:146] Out: -   PE: Abd: soft, seems a little distended still, mild upper abdominal tenderness, few BS, NGT with some dark bilious output  Lab Results:   Recent Labs  01/24/14 0445 01/25/14 0250  WBC 12.3* 7.2  HGB 14.2 12.3  HCT 40.1 35.3*  PLT 285 252   BMET  Recent Labs  01/24/14 0445 01/25/14 0250  NA 143 144  K 3.8 3.2*  CL 100 104  CO2 27 26  GLUCOSE 175* 123*  BUN 19 15  CREATININE 1.21* 1.06  CALCIUM 9.5 8.7   PT/INR  Recent Labs  01/24/14 0445  LABPROT 14.5  INR 1.13   CMP     Component Value Date/Time   NA 144 01/25/2014 0250   NA 142 12/03/2013 0833   K 3.2* 01/25/2014 0250   CL 104 01/25/2014 0250   CO2 26 01/25/2014 0250   GLUCOSE 123* 01/25/2014 0250   GLUCOSE 86 12/03/2013 0833   BUN 15 01/25/2014 0250   BUN 19 12/03/2013 0833   CREATININE 1.06 01/25/2014 0250   CALCIUM 8.7 01/25/2014 0250   PROT 7.5 01/24/2014 0445   PROT 7.0 12/03/2013 0833   ALBUMIN 3.6 01/24/2014 0445   AST 19 01/24/2014 0445   ALT 12 01/24/2014 0445   ALKPHOS 91 01/24/2014 0445   BILITOT 0.8 01/24/2014 0445   GFRNONAA 48* 01/25/2014 0250   GFRAA 56* 01/25/2014 0250   Lipase  No results found for this basename: lipase       Studies/Results: Ct Abdomen Pelvis  Wo Contrast  01/23/2014   CLINICAL DATA:  Epigastric pain and nausea. History of bowel resection for colon cancer.  EXAM: CT ABDOMEN AND PELVIS WITHOUT CONTRAST  TECHNIQUE: Multidetector CT imaging of the abdomen and pelvis was performed following the standard protocol without IV contrast.  COMPARISON:  CT abdomen and pelvis with contrast 02/09/2013.  FINDINGS: Mild dependent atelectasis is present at the lung bases bilaterally. No focal nodule, mass, or airspace disease is present. The heart is mildly enlarged without significant change. Atherosclerotic calcifications are present in the aorta without aneurysm.  Minimal fluid is present around the liver. The liver is otherwise unremarkable. Patient is status post splenectomy. Multiple soft tissue nodules are stable, reflecting residual splenules. Contrast is present within the stomach. The duodenum is unremarkable. The pancreas is within normal limits. There is significant interval enlargement of the common bile duct, now measuring 2.4 cm. No obstructing mass is evident. The adrenal glands are normal bilaterally. Renal atrophy is stable. The ureters are within normal limits.  The rectosigmoid colon is within normal limits. The left lower quadrant anastomosis is intact. The more proximal colon is within normal limits as well. The appendix is visualized and normal.  Dilated loops of small bowel are present in the mid abdomen with gradual dilution of contrast. The transition point is in the low anterior pelvis appears to be associated with adhesions. No obstructing mass lesion is present. There is no free fluid in the pelvis. The patient is status post hysterectomy. The ovaries are visualized and appear to be within normal limits.  The bone windows demonstrate partial fusion along the SI joints bilaterally. Mild degenerative changes are noted in the lumbar spine. No focal lytic or blastic lesions are evident.  IMPRESSION: 1. Partial small bowel obstruction with  transition point in the mid lower abdomen likely related to adhesions. 2. Stable appearance of the chronic CT anastomosis. 3. Atherosclerosis. 4. Significant interval increase and the size the common bile duct without an obstructing lesion. 5. Mild cardiomegaly without failure.   Electronically Signed   By: Nicole [name] M.D.   On: 01/23/2014 12:10   Dg Abd 1 View  01/24/2014   CLINICAL DATA:  Follow-up small bowel obstruction.  EXAM: ABDOMEN - 1 VIEW  COMPARISON:  01/23/2014  FINDINGS: Supine images of the abdomen were obtained. Nasogastric has been placed and the tip is in the gastric body region. There continues to be dilated loops of small bowel which contain air and oral contrast. There is stool in the colon but no significant contrast in the colon. There may be slightly increased distention of small bowel loops compared to the recent CT examination. Limited evaluation for free air on the supine images.  IMPRESSION: Persistent dilated loops of small bowel and there may be slightly increased distention of the small bowel loops. Findings remain compatible with a small bowel obstruction.  Nasogastric tube in the stomach body region.   Electronically Signed   By: Nicole [name] M.D.   On: 01/24/2014 10:50   Dg Abd Portable 2v  01/25/2014   CLINICAL DATA:  Abdominal pain.  EXAM: PORTABLE ABDOMEN - 2 VIEW  COMPARISON:  01/24/2014  FINDINGS: Decrease in prominence of small bowel loops without evidence of progressive small bowel obstruction. No free air is identified. Nasogastric tube continues to extend into the stomach.  IMPRESSION: Decrease in an dilatation of small bowel loops.  No free air.   Electronically Signed   By: Nicole [name] M.D.   On: 01/25/2014 08:07    Anti-infectives: Anti-infectives   None       Assessment/Plan  1. SBO 2. A fib  Plan: 1. X-ray films show improvement in her SBO with contrast and air in her colon today.  She is passing some flatus.  Will clamp her NGT and see how she  does. Return to suction if develops N/V.   LOS: 2 days    Nicole [name] E 01/25/2014, 9:48 AM Pager: [phone]

## 2014-01-25 NOTE — Progress Notes (Signed)
Utilization review completed. Shonte Soderlund, RN, BSN. 

## 2014-01-25 NOTE — Progress Notes (Signed)
ANTICOAGULATION CONSULT NOTE - Follow Up Consult  Pharmacy Consult for Heparin Indication: atrial fibrillation  Allergies  Allergen Reactions  . Iohexol      Code: VOM, Desc: PT REPORTS VOMITING W/ IVP DYE- ARS 12/26/08, Onset Date: [id number]   . Z-Pak [Azithromycin]   . Iodinated Diagnostic Agents Nausea Only    PT HAS HAD SINGULAR INCIDENT OF NAUSEA  W/ IV CONTYRAST INJECTION, NONE IF INJECTED SLOWLY//A.[place]    Patient Measurements: Height: 5\' 10"  (177.8 cm) Weight: 176 lb 2.4 oz (79.9 kg) IBW/kg (Calculated) : 68.5 Heparin Dosing Weight: 80 kg  Vital Signs: Temp: 98.3 F (36.8 C) (07/06 0700) Temp src: Oral (07/06 0700) BP: 163/81 mmHg (07/06 1000) Pulse Rate: 80 (07/06 1000)  Labs:  Recent Labs  01/23/14 0632 01/24/14 0445 01/24/14 1240 01/24/14 2200 01/25/14 0250  HGB 14.5 14.2  --   --  12.3  HCT 39.9 40.1  --   --  35.3*  PLT 271 285  --   --  252  APTT  --   --  34 78* 80*  LABPROT  --  14.5  --   --   --   INR  --  1.13  --   --   --   HEPARINUNFRC  --   --  1.08* 1.03* 0.86*  CREATININE 1.35* 1.21*  --   --  1.06    Estimated Creatinine Clearance: 45.8 ml/min (by C-G formula based on Cr of 1.06).   Assessment: 80 yo F on Eliquis PTA for hx afib/flutter now oh hold secondary to NPO status with SBO.  Last Eliquis dose was 7/3 PM.  Eliquis can also elevate the Anti-Xa (heparin level) that we use for monitoring Heparin infusions.  Will need to obtain baseline Anti-Xa level and until Eliquis is completely cleared, will need to dose heparin based on aPTT goals.  Noted MDs desire to maintain patient at lower end of therapeutic goal range.  APTT this AM remained therapeutic and in lower end of goal range. HL remains falsely elevated. RN reports no s/s of bleeding. Hgb and Plt remains wnl   Goal of Therapy:  Heparin level 0.3-0.7 units/ml -- target lower end of goal range aPTT 66-102 seconds -- target lower end of goal range Monitor platelets by  anticoagulation protocol: Yes   Plan:  Continue heparin infusion at 1100 units/hr  Daily Anti-Xa level, aPTT, and CBC. Monitor for s/s of bleeding   Albertina Parr, PharmD.  Clinical Pharmacist Pager [phone]

## 2014-01-26 DIAGNOSIS — N183 Chronic kidney disease, stage 3 unspecified: Secondary | ICD-10-CM

## 2014-01-26 DIAGNOSIS — I10 Essential (primary) hypertension: Secondary | ICD-10-CM

## 2014-01-26 DIAGNOSIS — I712 Thoracic aortic aneurysm, without rupture, unspecified: Secondary | ICD-10-CM

## 2014-01-26 DIAGNOSIS — Z85038 Personal history of other malignant neoplasm of large intestine: Secondary | ICD-10-CM

## 2014-01-26 DIAGNOSIS — E118 Type 2 diabetes mellitus with unspecified complications: Secondary | ICD-10-CM

## 2014-01-26 LAB — CBC
HCT: 35.9 % — ABNORMAL LOW (ref 36.0–46.0)
HEMOGLOBIN: 12.4 g/dL (ref 12.0–15.0)
MCH: 29 pg (ref 26.0–34.0)
MCHC: 34.5 g/dL (ref 30.0–36.0)
MCV: 84.1 fL (ref 78.0–100.0)
Platelets: 237 10*3/uL (ref 150–400)
RBC: 4.27 MIL/uL (ref 3.87–5.11)
RDW: 15.1 % (ref 11.5–15.5)
WBC: 10.3 10*3/uL (ref 4.0–10.5)

## 2014-01-26 LAB — GLUCOSE, CAPILLARY
GLUCOSE-CAPILLARY: 124 mg/dL — AB (ref 70–99)
GLUCOSE-CAPILLARY: 99 mg/dL (ref 70–99)
Glucose-Capillary: 116 mg/dL — ABNORMAL HIGH (ref 70–99)
Glucose-Capillary: 130 mg/dL — ABNORMAL HIGH (ref 70–99)
Glucose-Capillary: 162 mg/dL — ABNORMAL HIGH (ref 70–99)
Glucose-Capillary: 185 mg/dL — ABNORMAL HIGH (ref 70–99)

## 2014-01-26 LAB — COMPREHENSIVE METABOLIC PANEL
ALK PHOS: 68 U/L (ref 39–117)
ALT: 8 U/L (ref 0–35)
ANION GAP: 17 — AB (ref 5–15)
AST: 15 U/L (ref 0–37)
Albumin: 3 g/dL — ABNORMAL LOW (ref 3.5–5.2)
BUN: 9 mg/dL (ref 6–23)
CALCIUM: 8.9 mg/dL (ref 8.4–10.5)
CO2: 24 meq/L (ref 19–32)
Chloride: 102 mEq/L (ref 96–112)
Creatinine, Ser: 0.83 mg/dL (ref 0.50–1.10)
GFR, EST AFRICAN AMERICAN: 75 mL/min — AB (ref >90)
GFR, EST NON AFRICAN AMERICAN: 65 mL/min — AB (ref >90)
GLUCOSE: 121 mg/dL — AB (ref 70–99)
Potassium: 3.1 mEq/L — ABNORMAL LOW (ref 3.7–5.3)
SODIUM: 143 meq/L (ref 137–147)
Total Bilirubin: 0.6 mg/dL (ref 0.3–1.2)
Total Protein: 6.8 g/dL (ref 6.0–8.3)

## 2014-01-26 LAB — HEMOGLOBIN A1C
Hgb A1c MFr Bld: 5.5 % (ref <5.7)
MEAN PLASMA GLUCOSE: 111 mg/dL (ref <117)

## 2014-01-26 LAB — APTT
APTT: 70 s — AB (ref 24–37)
aPTT: 62 seconds — ABNORMAL HIGH (ref 24–37)

## 2014-01-26 LAB — HEPARIN LEVEL (UNFRACTIONATED)
HEPARIN UNFRACTIONATED: 0.3 [IU]/mL (ref 0.30–0.70)
Heparin Unfractionated: 0.37 IU/mL (ref 0.30–0.70)

## 2014-01-26 LAB — MAGNESIUM: Magnesium: 1.4 mg/dL — ABNORMAL LOW (ref 1.5–2.5)

## 2014-01-26 MED ORDER — POTASSIUM CHLORIDE 10 MEQ/100ML IV SOLN
10.0000 meq | INTRAVENOUS | Status: AC
  Administered 2014-01-26 (×4): 10 meq via INTRAVENOUS
  Filled 2014-01-26 (×4): qty 100

## 2014-01-26 MED ORDER — DEXTROSE 5 % IV SOLN
3.0000 g | Freq: Once | INTRAVENOUS | Status: AC
  Administered 2014-01-26: 3 g via INTRAVENOUS
  Filled 2014-01-26: qty 6

## 2014-01-26 NOTE — Progress Notes (Signed)
Patient ID: Nicole [name], female   DOB: July 29, 1930, 80 y.o.   MRN: [id number]    Subjective: Pt still passing flatus.  No nausea with NGT clamped all day yesterday.  Returned to suction today with no residual.  Objective: Vital signs in last 24 hours: Temp:  [97.8 F (36.6 C)-99.2 F (37.3 C)] 98.9 F (37.2 C) (07/07 0313) Pulse Rate:  [75-115] 75 (07/07 0400) Resp:  [16-24] 17 (07/07 0400) BP: (130-189)/(72-94) 162/92 mmHg (07/07 0400) SpO2:  [93 %-99 %] 96 % (07/07 0400) Weight:  [179 lb 0.2 oz (81.2 kg)] 179 lb 0.2 oz (81.2 kg) (07/07 0400) Last BM Date: 01/21/14  Intake/Output from previous day: 07/06 0701 - 07/07 0700 In: 0814 [I.V.:1252; NG/GT:30; IV Piggyback:300] Out: 2075 [Urine:2000; Emesis/NG output:75] Intake/Output this shift:    PE: Abd: soft, nontender today, +BS, ND  Lab Results:   Recent Labs  01/25/14 0250 01/26/14 0435  WBC 7.2 10.3  HGB 12.3 12.4  HCT 35.3* 35.9*  PLT 252 237   BMET  Recent Labs  01/25/14 0250 01/26/14 0435  NA 144 143  K 3.2* 3.1*  CL 104 102  CO2 26 24  GLUCOSE 123* 121*  BUN 15 9  CREATININE 1.06 0.83  CALCIUM 8.7 8.9   PT/INR  Recent Labs  01/24/14 0445  LABPROT 14.5  INR 1.13   CMP     Component Value Date/Time   NA 143 01/26/2014 0435   NA 142 12/03/2013 0833   K 3.1* 01/26/2014 0435   CL 102 01/26/2014 0435   CO2 24 01/26/2014 0435   GLUCOSE 121* 01/26/2014 0435   GLUCOSE 86 12/03/2013 0833   BUN 9 01/26/2014 0435   BUN 19 12/03/2013 0833   CREATININE 0.83 01/26/2014 0435   CALCIUM 8.9 01/26/2014 0435   PROT 6.8 01/26/2014 0435   PROT 7.0 12/03/2013 0833   ALBUMIN 3.0* 01/26/2014 0435   AST 15 01/26/2014 0435   ALT 8 01/26/2014 0435   ALKPHOS 68 01/26/2014 0435   BILITOT 0.6 01/26/2014 0435   GFRNONAA 65* 01/26/2014 0435   GFRAA 75* 01/26/2014 0435   Lipase  No results found for this basename: lipase       Studies/Results: Dg Abd 1 View  01/24/2014   CLINICAL DATA:  Follow-up small bowel obstruction.  EXAM:  ABDOMEN - 1 VIEW  COMPARISON:  01/23/2014  FINDINGS: Supine images of the abdomen were obtained. Nasogastric has been placed and the tip is in the gastric body region. There continues to be dilated loops of small bowel which contain air and oral contrast. There is stool in the colon but no significant contrast in the colon. There may be slightly increased distention of small bowel loops compared to the recent CT examination. Limited evaluation for free air on the supine images.  IMPRESSION: Persistent dilated loops of small bowel and there may be slightly increased distention of the small bowel loops. Findings remain compatible with a small bowel obstruction.  Nasogastric tube in the stomach body region.   Electronically Signed   By: Markus Daft M.D.   On: 01/24/2014 10:50   Dg Abd Portable 2v  01/25/2014   CLINICAL DATA:  Abdominal pain.  EXAM: PORTABLE ABDOMEN - 2 VIEW  COMPARISON:  01/24/2014  FINDINGS: Decrease in prominence of small bowel loops without evidence of progressive small bowel obstruction. No free air is identified. Nasogastric tube continues to extend into the stomach.  IMPRESSION: Decrease in an dilatation of small bowel loops.  No  free air.   Electronically Signed   By: Aletta Edouard M.D.   On: 01/25/2014 08:07    Anti-infectives: Anti-infectives   None       Assessment/Plan  1. SBO, improving  Plan: 1. Will dc NGT and try clear liquids today. 2. Mobilize and pulm toilet   LOS: 3 days    Nicole [name] E 01/26/2014, 9:04 AM Pager: [phone]

## 2014-01-26 NOTE — Progress Notes (Signed)
[place] TEAM 1 - Stepdown/ICU TEAM Progress Note  Nicole [name] ENI:[id number] DOB: 02-24-1931 DOA: 01/23/2014 PCP: Hollace Kinnier, DO  Admit HPI / Brief Narrative: 80 y.o. BF PMHx of chronic systolic CHF EF of 61%, DM, CKD, Chronic afib on apixaban, h/o cholecystectomy (10/13), and S/P surgery for Colon CA in 1999,  who presented to the ER with the abdom pain assoc w/ N/V. She'd had intermittent abdominal pain on and off for about 1 month, but worsened the day prior to her admit, and became accompanied by nausea and vomiting. Due to persistence of symptoms she presented to the ER.  In ER, noted to have Abd distension, tenderness and CT with PSBO with transition point.   HPI/Subjective: 7/7 A/O x 4. States dec Abd pain, (-) N/V  Assessment/Plan: Small bowel obstruction  -likely secondary to adhesions  -Start clear liquids today, remove NG tube   -improved per KUB - passing some flatus  -ongoing care per Gen Surgery   Chronic Afib / Aflutter w/ acute RVR  -Currently in NSR   -Continue IV Cardizem until patient shows she can tolerate PO  -Continue IV Lopressor   -IV Heparin per pharmacy - Eliquis on hold   Hypokalemia  -replete w/ goal of 4.0 or > to maximize smooth muscle contractility -7/7 Potassium IV 73meq x 4 runs   Hypomagnesemia -Mg IV x 3gm   Chronic Systolic CHF - recent EF 45- 50%;  Moderate MR  -clinically compensaated  -watch closely while on IVF, lasix remains on hold as pt NPO   Type II diabetes mellitus  -CBG's stable - continue sensitive SSI  -Obtain hemoglobin A1c  Uncontrolled HTN  -See atrial fibrillation/flutter   Known thoracic aortic aneurysm  - Hx colon CA s/p resection '99   Hypothyroidism  -c/w IV Levothyroxine 25 mcg daily until patient can tolerate PO meds.  CKD 3  -stable    Code Status: FULL Family Communication: no family present at time of exam Disposition Plan: Per surgery    Consultants: Dr. Donnie Mesa (Gen.  surgery)   Procedure/Significant Events: 7/4 CT abdomen pelvis without contrast; Partial SBO w/ transition point mid lower abdomen likely related to adhesions.  - Stable appearance of the chronic CT anastomosis.  -Significant interval increase and the size the common bile duct without an obstructing lesion.  -Mild cardiomegaly without failure. 7/5 PCXR; Persistent dilated loops small bowel; may be slightly increased distention of the small bowel loops. Findings remain compatible with a small bowel  7/6 PCXR; Decrease in an dilatation of small bowel loops. No free air.     Culture NA  Antibiotics: NA  DVT prophylaxis: IV heparin   Devices NA   LINES / TUBES:  7/4 NG tube right narrative 16Fr 7/4 22ga right forearm 7/4 ga left antecubital    Continuous Infusions: . sodium chloride 80 mL/hr at 01/26/14 0653  . diltiazem (CARDIZEM) infusion 10 mg/hr (01/26/14 0524)  . heparin 1,100 Units/hr (01/26/14 0653)    Objective: VITAL SIGNS: Temp: 98.9 F (37.2 C) (07/07 0313) Temp src: Oral (07/07 0313) BP: 162/92 mmHg (07/07 0400) Pulse Rate: 75 (07/07 0400) SPO2; 90% on room air FIO2:   Intake/Output Summary (Last 24 hours) at 01/26/14 0821 Last data filed at 01/26/14 0517  Gross per 24 hour  Intake   1436 ml  Output   2075 ml  Net   -639 ml     Exam: General: A./O. x4, NAD, No acute respiratory distress Lungs: Clear to auscultation  bilaterally without wheezes or crackles Cardiovascular: Regular rate and rhythm without murmur gallop or rub normal S1 and S2 Abdomen: Mild tenderness left upper quadrant/right upper quadrant to palpation, nondistended, soft, bowel sounds positive, no rebound, no ascites, no appreciable mass Extremities: No significant cyanosis, clubbing, or edema bilateral lower extremities  Data Reviewed: Basic Metabolic Panel:  Recent Labs Lab 01/23/14 0632 01/24/14 0445 01/25/14 0250 01/26/14 0435  NA 142 143 144 143  K 4.0 3.8 3.2*  3.1*  CL 99 100 104 102  CO2 28 27 26 24   GLUCOSE 174* 175* 123* 121*  BUN 18 19 15 9   CREATININE 1.35* 1.21* 1.06 0.83  CALCIUM 9.8 9.5 8.7 8.9  MG  --   --   --  1.4*   Liver Function Tests:  Recent Labs Lab 01/23/14 0632 01/24/14 0445 01/26/14 0435  AST 23 19 15   ALT 10 12 8   ALKPHOS 92 91 68  BILITOT 0.8 0.8 0.6  PROT 7.9 7.5 6.8  ALBUMIN 4.1 3.6 3.0*   No results found for this basename: LIPASE, AMYLASE,  in the last 168 hours No results found for this basename: AMMONIA,  in the last 168 hours CBC:  Recent Labs Lab 01/23/14 0632 01/24/14 0445 01/25/14 0250 01/26/14 0435  WBC 11.1* 12.3* 7.2 10.3  NEUTROABS 8.8*  --   --   --   HGB 14.5 14.2 12.3 12.4  HCT 39.9 40.1 35.3* 35.9*  MCV 82.4 83.0 83.5 84.1  PLT 271 285 252 237   Cardiac Enzymes: No results found for this basename: CKTOTAL, CKMB, CKMBINDEX, TROPONINI,  in the last 168 hours BNP (last 3 results) No results found for this basename: PROBNP,  in the last 8760 hours CBG:  Recent Labs Lab 01/25/14 0738 01/25/14 1130 01/25/14 1544 01/25/14 2015 01/25/14 2308  GLUCAP 147* 131* 155* 116* 130*    No results found for this or any previous visit (from the past 240 hour(s)).   Studies:  Recent x-ray studies have been reviewed in detail by the Attending Physician  Scheduled Meds:  Scheduled Meds: . antiseptic oral rinse  15 mL Mouth Rinse q12n4p  . chlorhexidine  15 mL Mouth Rinse BID  . insulin aspart  0-9 Units Subcutaneous 6 times per day  . levothyroxine  25 mcg Intravenous Daily  . metoprolol  10 mg Intravenous 4 times per day    Time spent on care of this patient: 40 mins   Allie Bossier , MD   [facility] Office  [phone] Pager [phone]  On-Call/Text Page:      [email or web address]      password [email or web address]  If 7PM-7AM, please contact night-coverage [email or web address] Password [email or web address] 01/26/2014, 8:21 AM   LOS: 3 days

## 2014-01-26 NOTE — Progress Notes (Signed)
ANTICOAGULATION CONSULT NOTE - Follow Up Consult  Pharmacy Consult for Heparin Indication: atrial fibrillation  Allergies  Allergen Reactions  . Iohexol      Code: VOM, Desc: PT REPORTS VOMITING W/ IVP DYE- ARS 12/26/08, Onset Date: [id number]   . Z-Pak [Azithromycin]   . Iodinated Diagnostic Agents Nausea Only    PT HAS HAD SINGULAR INCIDENT OF NAUSEA  W/ IV CONTYRAST INJECTION, NONE IF INJECTED SLOWLY//A.[place]    Patient Measurements: Height: 5\' 10"  (177.8 cm) Weight: 179 lb 0.2 oz (81.2 kg) IBW/kg (Calculated) : 68.5 Heparin Dosing Weight: 80 kg  Vital Signs: Temp: 98.4 F (36.9 C) (07/07 0918) Temp src: Oral (07/07 0918) BP: 162/92 mmHg (07/07 0400) Pulse Rate: 103 (07/07 0918)  Labs:  Recent Labs  01/24/14 0445  01/24/14 2200 01/25/14 0250 01/26/14 0435  HGB 14.2  --   --  12.3 12.4  HCT 40.1  --   --  35.3* 35.9*  PLT 285  --   --  252 237  APTT  --   < > 78* 80* 62*  LABPROT 14.5  --   --   --   --   INR 1.13  --   --   --   --   HEPARINUNFRC  --   < > 1.03* 0.86* 0.37  CREATININE 1.21*  --   --  1.06 0.83  < > = values in this interval not displayed.  Estimated Creatinine Clearance: 58.5 ml/min (by C-G formula based on Cr of 0.83).   Assessment: 80 yo F on Eliquis PTA for hx afib/flutter now oh hold secondary to NPO status with SBO.  Last Eliquis dose was 7/3 PM.  Eliquis can also elevate the Anti-Xa (heparin level) that we use for monitoring Heparin infusions.  Will need to obtain baseline Anti-Xa level and until Eliquis is completely cleared, will need to dose heparin based on aPTT goals.  Noted MDs desire to maintain patient at lower end of therapeutic goal range.  APTT this AM was sub-therapeutic this AM. HL is down and almost correlating with APTT. Hgb and Plt remains wnl. No bleeding noted per RN   Goal of Therapy:  Heparin level 0.3-0.7 units/ml -- target lower end of goal range aPTT 66-102 seconds -- target lower end of goal range Monitor  platelets by anticoagulation protocol: Yes   Plan:  Increase Heparin infusion at 1150 units/hr. Daily Anti-Xa level, aPTT, and CBC.  Likely d/c APTT levels after tom   Albertina Parr, PharmD.  Clinical Pharmacist Pager [phone]

## 2014-01-26 NOTE — Progress Notes (Signed)
ANTICOAGULATION CONSULT NOTE - Follow Up Consult  Pharmacy Consult for Heparin Indication: atrial fibrillation  Allergies  Allergen Reactions  . Iohexol      Code: VOM, Desc: PT REPORTS VOMITING W/ IVP DYE- ARS 12/26/08, Onset Date: [id number]   . Z-Pak [Azithromycin]   . Iodinated Diagnostic Agents Nausea Only    PT HAS HAD SINGULAR INCIDENT OF NAUSEA  W/ IV CONTYRAST INJECTION, NONE IF INJECTED SLOWLY//A.[place]    Patient Measurements: Height: 5\' 10"  (177.8 cm) Weight: 179 lb 0.2 oz (81.2 kg) IBW/kg (Calculated) : 68.5 Heparin Dosing Weight: 80 kg  Vital Signs: Temp: 98.1 F (36.7 C) (07/07 1952) Temp src: Oral (07/07 1952) BP: 158/81 mmHg (07/07 1952) Pulse Rate: 69 (07/07 1952)  Labs:  Recent Labs  01/24/14 0445  01/25/14 0250 01/26/14 0435 01/26/14 2009  HGB 14.2  --  12.3 12.4  --   HCT 40.1  --  35.3* 35.9*  --   PLT 285  --  252 237  --   APTT  --   < > 80* 62* 70*  LABPROT 14.5  --   --   --   --   INR 1.13  --   --   --   --   HEPARINUNFRC  --   < > 0.86* 0.37 0.30  CREATININE 1.21*  --  1.06 0.83  --   < > = values in this interval not displayed.  Estimated Creatinine Clearance: 58.5 ml/min (by C-G formula based on Cr of 0.83).   Assessment: 80 yo F on Eliquis PTA for hx afib/flutter now oh hold secondary to NPO status with SBO.  Last Eliquis dose was 7/3 PM.  Eliquis can also elevate the Anti-Xa (heparin level) that we use for monitoring Heparin infusions.  Will need to obtain baseline Anti-Xa level and until Eliquis is completely cleared, will need to dose heparin based on aPTT goals.  Noted MDs desire to maintain patient at lower end of therapeutic goal range.  APTT and Heparin level are now correlating.  Will transition to monitoring via heparin level only.  Noted patient at lower end of goal on repeat.  Will increase heparin infusion slightly.  Noted advancing diet to clears.  Hopefully pt can be restarted on Eliquis soon.  Goal of Therapy:   Heparin level 0.3-0.7 units/ml -- target lower end of goal range aPTT 66-102 seconds -- target lower end of goal range Monitor platelets by anticoagulation protocol: Yes   Plan:  Increase heparin infusion to 1200 units/hr  Daily Anti-Xa level and CBC. Monitor for s/s of bleeding   [employer], Pharm.D., BCPS Clinical Pharmacist Pager [phone] 01/26/2014 8:57 PM

## 2014-01-26 NOTE — Progress Notes (Signed)
Passing flatus, minimal NG output Abd - less distended Remove NG tube - trial of clear liquids.  Imogene Burn. Georgette Dover, MD, [facility]  General/ Trauma Surgery  01/26/2014 9:43 AM

## 2014-01-27 LAB — GLUCOSE, CAPILLARY
GLUCOSE-CAPILLARY: 161 mg/dL — AB (ref 70–99)
GLUCOSE-CAPILLARY: 147 mg/dL — AB (ref 70–99)
Glucose-Capillary: 113 mg/dL — ABNORMAL HIGH (ref 70–99)
Glucose-Capillary: 117 mg/dL — ABNORMAL HIGH (ref 70–99)
Glucose-Capillary: 118 mg/dL — ABNORMAL HIGH (ref 70–99)
Glucose-Capillary: 122 mg/dL — ABNORMAL HIGH (ref 70–99)
Glucose-Capillary: 177 mg/dL — ABNORMAL HIGH (ref 70–99)

## 2014-01-27 LAB — CBC
HCT: 33.8 % — ABNORMAL LOW (ref 36.0–46.0)
Hemoglobin: 11.7 g/dL — ABNORMAL LOW (ref 12.0–15.0)
MCH: 29 pg (ref 26.0–34.0)
MCHC: 34.6 g/dL (ref 30.0–36.0)
MCV: 83.7 fL (ref 78.0–100.0)
PLATELETS: 208 10*3/uL (ref 150–400)
RBC: 4.04 MIL/uL (ref 3.87–5.11)
RDW: 14.8 % (ref 11.5–15.5)
WBC: 9.7 10*3/uL (ref 4.0–10.5)

## 2014-01-27 LAB — BASIC METABOLIC PANEL
Anion gap: 14 (ref 5–15)
BUN: 9 mg/dL (ref 6–23)
CO2: 24 mEq/L (ref 19–32)
CREATININE: 0.86 mg/dL (ref 0.50–1.10)
Calcium: 8.6 mg/dL (ref 8.4–10.5)
Chloride: 104 mEq/L (ref 96–112)
GFR calc non Af Amer: 62 mL/min — ABNORMAL LOW (ref >90)
GFR, EST AFRICAN AMERICAN: 72 mL/min — AB (ref >90)
Glucose, Bld: 113 mg/dL — ABNORMAL HIGH (ref 70–99)
Potassium: 3.2 mEq/L — ABNORMAL LOW (ref 3.7–5.3)
Sodium: 142 mEq/L (ref 137–147)

## 2014-01-27 LAB — HEPARIN LEVEL (UNFRACTIONATED): Heparin Unfractionated: 0.34 IU/mL (ref 0.30–0.70)

## 2014-01-27 LAB — MAGNESIUM: Magnesium: 2 mg/dL (ref 1.5–2.5)

## 2014-01-27 MED ORDER — POTASSIUM CHLORIDE 10 MEQ/100ML IV SOLN
10.0000 meq | INTRAVENOUS | Status: AC
  Administered 2014-01-27 – 2014-01-28 (×6): 10 meq via INTRAVENOUS
  Filled 2014-01-27 (×2): qty 100

## 2014-01-27 MED ORDER — POTASSIUM CHLORIDE IN NACL 20-0.9 MEQ/L-% IV SOLN
INTRAVENOUS | Status: DC
  Administered 2014-01-27 – 2014-01-28 (×4): via INTRAVENOUS
  Filled 2014-01-27 (×4): qty 1000

## 2014-01-27 MED ORDER — BISACODYL 10 MG RE SUPP
10.0000 mg | Freq: Every day | RECTAL | Status: DC | PRN
  Administered 2014-01-27: 10 mg via RECTAL
  Filled 2014-01-27: qty 1

## 2014-01-27 NOTE — Progress Notes (Signed)
[place] TEAM 1 - Stepdown/ICU TEAM Progress Note  Nicole [name] JQB:[id number] DOB: 12/12/1930 DOA: 01/23/2014 PCP: Hollace Kinnier, DO  Admit HPI / Brief Narrative: 80 y.o. female with PMH of chronic systolic CHF (EF of 40%), DM, CKD, Chronic afib on apixaban, h/o cholecystectomy (10/13) and surgery for Colon CA in '99 who presented to the ER with the abdom pain assoc w/ N/V. She'd had intermittent abdominal pain on and off for about 1 month, but worsened the day prior to her admit, and became accompanied by nausea and vomiting. Due to persistence of symptoms she presented to the ER.  In ER, she was noted to have abd distension, tenderness and CT with PSBO with transition point.  HPI/Subjective: No new complaints today.  Passing gas but infrequently.  No BM.  Doing ok w/ clear liquids, but intake makes her feel "bloated."  No vomiting.  No signif nausea.     Assessment/Plan:  Small bowel obstruction -likely secondary to adhesions  -passing some flatus  -ongoing care per Gen Surgery   Chronic Afib / Aflutter w/ acute RVR -oral cardizem on hold as NPO -  IV Lopressor + cardizem gtt -IV Heparin per pharmacy - Eliquis on hold   Hypokalemia -replete w/ goal of 4.0 or > to maximize smooth muscle contractility   Hypomagnesemia  repleted to goal of 2.0  Chronic Systolic CHF - recent EF 45- 50% -clinically compensaated  -watch closely while on IVF, lasix remains on hold as pt NPO  Type II diabetes mellitus  -CBG's stable - continue SSI   Uncontrolled HTN -scheduled BB - adjust medical tx and follow   Known thoracic aortic aneurysm   Hx colon CA s/p resection '99  Hypothyroidism  -c/w IV Levothyroxine  CKD 3  -stable  Code Status: FULL Family Communication: spoke w/ sisters at bedside  Disposition Plan: SDU  Consultants: Gen Surgery   Procedures: none  Antibiotics: none  DVT prophylaxis: IV heparin   Objective: Blood pressure 181/97, pulse 90, temperature  97.7 F (36.5 C), temperature source Oral, resp. rate 25, height 5\' 10"  (1.778 m), weight 82.1 kg (181 lb), SpO2 93.00%.  Intake/Output Summary (Last 24 hours) at 01/27/14 1449 Last data filed at 01/27/14 1243  Gross per 24 hour  Intake 2522.31 ml  Output    150 ml  Net 2372.31 ml   Exam: General: No acute respiratory distress Lungs: Clear to auscultation bilaterally without wheezes or crackles Cardiovascular: irreg irreg - rate ~90 - no appreciable M or rub  Abdomen: Nontender, distended, soft, bowel sounds minimal, no rebound, no ascites, no appreciable mass Extremities: No significant cyanosis, clubbing, or edema bilateral lower extremities  Data Reviewed: Basic Metabolic Panel:  Recent Labs Lab 01/23/14 0632 01/24/14 0445 01/25/14 0250 01/26/14 0435 01/27/14 0248  NA 142 143 144 143 142  K 4.0 3.8 3.2* 3.1* 3.2*  CL 99 100 104 102 104  CO2 28 27 26 24 24   GLUCOSE 174* 175* 123* 121* 113*  BUN 18 19 15 9 9   CREATININE 1.35* 1.21* 1.06 0.83 0.86  CALCIUM 9.8 9.5 8.7 8.9 8.6  MG  --   --   --  1.4* 2.0   Liver Function Tests:  Recent Labs Lab 01/23/14 0632 01/24/14 0445 01/26/14 0435  AST 23 19 15   ALT 10 12 8   ALKPHOS 92 91 68  BILITOT 0.8 0.8 0.6  PROT 7.9 7.5 6.8  ALBUMIN 4.1 3.6 3.0*   CBC:  Recent Labs Lab 01/23/14 [phone]  01/24/14 0445 01/25/14 0250 01/26/14 0435 01/27/14 0248  WBC 11.1* 12.3* 7.2 10.3 9.7  NEUTROABS 8.8*  --   --   --   --   HGB 14.5 14.2 12.3 12.4 11.7*  HCT 39.9 40.1 35.3* 35.9* 33.8*  MCV 82.4 83.0 83.5 84.1 83.7  PLT 271 285 252 237 208   CBG:  Recent Labs Lab 01/26/14 1951 01/27/14 0004 01/27/14 0511 01/27/14 0734 01/27/14 1147  GLUCAP 185* 113* 118* 122* 161*    Studies:  Recent x-ray studies have been reviewed in detail by the Attending Physician  Scheduled Meds:  Scheduled Meds: . antiseptic oral rinse  15 mL Mouth Rinse q12n4p  . chlorhexidine  15 mL Mouth Rinse BID  . insulin aspart  0-9 Units  Subcutaneous 6 times per day  . levothyroxine  25 mcg Intravenous Daily  . metoprolol  10 mg Intravenous 4 times per day    Time spent on care of this patient: 35 mins   Tou Hayner T , MD   [facility] Office  [phone] Pager - Text Page per Shea Evans as per below:  On-Call/Text Page:      [email or web address]      password [email or web address]  If 7PM-7AM, please contact night-coverage [email or web address] Password [email or web address] 01/27/2014, 2:49 PM   LOS: 4 days

## 2014-01-27 NOTE — Care Management Note (Unsigned)
    Page 1 of 1   01/27/2014     2:36:52 PM CARE MANAGEMENT NOTE 01/27/2014  Patient:  Nicole [name], Nicole [name]   Account Number:  [id number]  Date Initiated:  01/27/2014  Documentation initiated by:  GRAVES-BIGELOW,Audryanna Zurita  Subjective/Objective Assessment:   Pt admitted for Abd pain, N/V. NG tube placed to low wall suction. Per MD notes tolerating clears. Continues on cardizem gtt.     Action/Plan:   CM will continue to monitor for disposition needs.   Anticipated DC Date:  01/29/2014   Anticipated DC Plan:  [place]  CM consult      Choice offered to / List presented to:             Status of service:  In process, will continue to follow Medicare Important Message given?  YES (If response is "NO", the following Medicare IM given date fields will be blank) Date Medicare IM given:  01/27/2014 Medicare IM given by:  GRAVES-BIGELOW,Falicity Sheets Date Additional Medicare IM given:   Additional Medicare IM given by:    Discharge Disposition:    Per UR Regulation:  Reviewed for med. necessity/level of care/duration of stay  If discussed at [place] of Stay Meetings, dates discussed:   01/28/2014    Comments:

## 2014-01-27 NOTE — Progress Notes (Signed)
[facility] Progress Note     Subjective: Pt doing better.  Has already walked 2 laps this am.  No N/V since NG removed.  Tolerating clears, but feels a bit full.  Hesitant to advance to fulls.  Some flatus, no BM yet.  Objective: Vital signs in last 24 hours: Temp:  [98.1 F (36.7 C)-98.4 F (36.9 C)] 98.4 F (36.9 C) (07/08 0727) Pulse Rate:  [69-88] 74 (07/08 0727) Resp:  [16-26] 26 (07/08 0727) BP: (146-169)/(81-138) 161/87 mmHg (07/08 0727) SpO2:  [91 %-99 %] 96 % (07/08 0727) Weight:  [181 lb (82.1 kg)] 181 lb (82.1 kg) (07/08 0425) Last BM Date: 01/21/14  Intake/Output from previous day: 07/07 0701 - 07/08 0700 In: 1766.2 [P.O.:240; I.V.:1526.2] Out: 50 [Urine:50] Intake/Output this shift: Total I/O In: 2522.3 [P.O.:240; I.V.:2282.3] Out: -   PE: Gen:  Alert, NAD, pleasant Abd: Soft, mild distension, NT, +BS, no HSM, incisions C/D/I   Lab Results:   Recent Labs  01/26/14 0435 01/27/14 0248  WBC 10.3 9.7  HGB 12.4 11.7*  HCT 35.9* 33.8*  PLT 237 208   BMET  Recent Labs  01/26/14 0435 01/27/14 0248  NA 143 142  K 3.1* 3.2*  CL 102 104  CO2 24 24  GLUCOSE 121* 113*  BUN 9 9  CREATININE 0.83 0.86  CALCIUM 8.9 8.6   PT/INR No results found for this basename: LABPROT, INR,  in the last 72 hours CMP     Component Value Date/Time   NA 142 01/27/2014 0248   NA 142 12/03/2013 0833   K 3.2* 01/27/2014 0248   CL 104 01/27/2014 0248   CO2 24 01/27/2014 0248   GLUCOSE 113* 01/27/2014 0248   GLUCOSE 86 12/03/2013 0833   BUN 9 01/27/2014 0248   BUN 19 12/03/2013 0833   CREATININE 0.86 01/27/2014 0248   CALCIUM 8.6 01/27/2014 0248   PROT 6.8 01/26/2014 0435   PROT 7.0 12/03/2013 0833   ALBUMIN 3.0* 01/26/2014 0435   AST 15 01/26/2014 0435   ALT 8 01/26/2014 0435   ALKPHOS 68 01/26/2014 0435   BILITOT 0.6 01/26/2014 0435   GFRNONAA 62* 01/27/2014 0248   GFRAA 72* 01/27/2014 0248   Lipase  No results found for this basename: lipase       Studies/Results: No  results found.  Anti-infectives: Anti-infectives   None       Assessment/Plan 1. SBO, improving   Plan:  1. NG tube removed, tolerating clears, feels distended and full so will wait until she passes more gas or BM before advancing diet to fulls 2. Mobilize and pulm toilet  3. SCD's and heparin 4. Hopefully she will continue to improve without surgical interventions.    LOS: 4 days    DORT, Kenidi Elenbaas 01/27/2014, 11:07 AM Pager: [phone]

## 2014-01-27 NOTE — Progress Notes (Signed)
ANTICOAGULATION CONSULT NOTE - Follow Up Consult  Pharmacy Consult for Heparin Indication: atrial fibrillation  Allergies  Allergen Reactions  . Iohexol      Code: VOM, Desc: PT REPORTS VOMITING W/ IVP DYE- ARS 12/26/08, Onset Date: [id number]   . Z-Pak [Azithromycin]   . Iodinated Diagnostic Agents Nausea Only    PT HAS HAD SINGULAR INCIDENT OF NAUSEA  W/ IV CONTYRAST INJECTION, NONE IF INJECTED SLOWLY//A.[place]    Patient Measurements: Height: 5\' 10"  (177.8 cm) Weight: 181 lb (82.1 kg) IBW/kg (Calculated) : 68.5 Heparin Dosing Weight: 80 kg  Vital Signs: Temp: 97.7 F (36.5 C) (07/08 1146) Temp src: Oral (07/08 1146) BP: 161/87 mmHg (07/08 0727) Pulse Rate: 74 (07/08 0727)  Labs:  Recent Labs  01/25/14 0250 01/26/14 0435 01/26/14 2009 01/27/14 0248  HGB 12.3 12.4  --  11.7*  HCT 35.3* 35.9*  --  33.8*  PLT 252 237  --  208  APTT 80* 62* 70*  --   HEPARINUNFRC 0.86* 0.37 0.30 0.34  CREATININE 1.06 0.83  --  0.86    Estimated Creatinine Clearance: 56.4 ml/min (by C-G formula based on Cr of 0.86).   Assessment: 80 yo F on Eliquis PTA for hx afib/flutter now oh hold secondary to NPO status with SBO.  Last Eliquis dose was 7/3 PM.  Noted MDs desire to maintain patient at lower end of therapeutic goal range.  Heparin level this AM was therapeutic and at lower end of goal range as desired. Patient is tolerating clears but feeling full. Surgery may hold off on advancing to fulls.   Goal of Therapy:  Heparin level 0.3-0.7 units/ml -- target lower end of goal range Monitor platelets by anticoagulation protocol: Yes   Plan:  Continue heparin infusion at 1200 units/hr  Daily Anti-Xa level, aPTT, and CBC.  Monitor for s/s of bleeding  F/u transitioning back to [place], PharmD.  Clinical Pharmacist Pager [phone]

## 2014-01-27 NOTE — Progress Notes (Signed)
SBO resolving.  Dulcolax suppository  Clears.  Imogene Burn. Georgette Dover, MD, [facility] Surgery  General/ Trauma Surgery  01/27/2014 4:26 PM

## 2014-01-28 ENCOUNTER — Inpatient Hospital Stay (HOSPITAL_COMMUNITY): Payer: Medicare Other

## 2014-01-28 LAB — GLUCOSE, CAPILLARY
GLUCOSE-CAPILLARY: 134 mg/dL — AB (ref 70–99)
Glucose-Capillary: 116 mg/dL — ABNORMAL HIGH (ref 70–99)
Glucose-Capillary: 116 mg/dL — ABNORMAL HIGH (ref 70–99)
Glucose-Capillary: 120 mg/dL — ABNORMAL HIGH (ref 70–99)
Glucose-Capillary: 121 mg/dL — ABNORMAL HIGH (ref 70–99)
Glucose-Capillary: 123 mg/dL — ABNORMAL HIGH (ref 70–99)
Glucose-Capillary: 131 mg/dL — ABNORMAL HIGH (ref 70–99)

## 2014-01-28 LAB — BASIC METABOLIC PANEL
Anion gap: 13 (ref 5–15)
BUN: 7 mg/dL (ref 6–23)
CALCIUM: 9 mg/dL (ref 8.4–10.5)
CO2: 25 mEq/L (ref 19–32)
CREATININE: 0.8 mg/dL (ref 0.50–1.10)
Chloride: 103 mEq/L (ref 96–112)
GFR calc Af Amer: 79 mL/min — ABNORMAL LOW (ref >90)
GFR calc non Af Amer: 68 mL/min — ABNORMAL LOW (ref >90)
GLUCOSE: 127 mg/dL — AB (ref 70–99)
Potassium: 3.7 mEq/L (ref 3.7–5.3)
Sodium: 141 mEq/L (ref 137–147)

## 2014-01-28 LAB — CBC
HEMATOCRIT: 35.4 % — AB (ref 36.0–46.0)
Hemoglobin: 12.7 g/dL (ref 12.0–15.0)
MCH: 29.8 pg (ref 26.0–34.0)
MCHC: 35.9 g/dL (ref 30.0–36.0)
MCV: 83.1 fL (ref 78.0–100.0)
Platelets: 263 10*3/uL (ref 150–400)
RBC: 4.26 MIL/uL (ref 3.87–5.11)
RDW: 14.9 % (ref 11.5–15.5)
WBC: 10.9 10*3/uL — ABNORMAL HIGH (ref 4.0–10.5)

## 2014-01-28 LAB — HEPARIN LEVEL (UNFRACTIONATED): Heparin Unfractionated: 0.36 IU/mL (ref 0.30–0.70)

## 2014-01-28 LAB — MAGNESIUM: Magnesium: 1.7 mg/dL (ref 1.5–2.5)

## 2014-01-28 MED ORDER — OXYMETAZOLINE HCL 0.05 % NA SOLN
1.0000 | Freq: Two times a day (BID) | NASAL | Status: DC
  Administered 2014-01-28 – 2014-01-30 (×5): 1 via NASAL
  Filled 2014-01-28: qty 15

## 2014-01-28 MED ORDER — BISACODYL 10 MG RE SUPP
10.0000 mg | Freq: Every day | RECTAL | Status: DC
  Administered 2014-01-28 – 2014-01-30 (×3): 10 mg via RECTAL
  Filled 2014-01-28 (×3): qty 1

## 2014-01-28 NOTE — Progress Notes (Signed)
Paged Dr. Sloan Leiter regarding pts nose bleed and on heparin drip. New orders received.

## 2014-01-28 NOTE — Progress Notes (Signed)
Utilization review completed.  

## 2014-01-28 NOTE — Progress Notes (Signed)
Patient is currently having epistaxis.  Heparin gtt off Flatus, no BM Tolerating full liquids.  X-ray improved. Advance diet slowly.  Imogene Burn. Georgette Dover, MD, [facility] Surgery  General/ Trauma Surgery  01/28/2014 2:59 PM

## 2014-01-28 NOTE — Progress Notes (Signed)
Informed by RN-patient had nose bleed-by the time this MD evaluated patient, nose bleed resolved. Left nare-hyperemic. Hold heparin gtt for today, resume tomorrow, if no further epistaxis.

## 2014-01-28 NOTE — Progress Notes (Signed)
ANTICOAGULATION CONSULT NOTE - Follow Up Consult  Pharmacy Consult for Heparin Indication: atrial fibrillation  Allergies  Allergen Reactions  . Iohexol      Code: VOM, Desc: PT REPORTS VOMITING W/ IVP DYE- ARS 12/26/08, Onset Date: [id number]   . Z-Pak [Azithromycin]   . Iodinated Diagnostic Agents Nausea Only    PT HAS HAD SINGULAR INCIDENT OF NAUSEA  W/ IV CONTYRAST INJECTION, NONE IF INJECTED SLOWLY//A.[place]    Patient Measurements: Height: 5\' 10"  (177.8 cm) Weight: 180 lb 12.4 oz (82 kg) IBW/kg (Calculated) : 68.5 Heparin Dosing Weight: 80 kg  Vital Signs: Temp: 98.6 F (37 C) (07/09 1108) Temp src: Oral (07/09 1108) BP: 146/84 mmHg (07/09 1108) Pulse Rate: 89 (07/09 1108)  Labs:  Recent Labs  01/26/14 0435 01/26/14 2009 01/27/14 0248 01/28/14 0307  HGB 12.4  --  11.7* 12.7  HCT 35.9*  --  33.8* 35.4*  PLT 237  --  208 263  APTT 62* 70*  --   --   HEPARINUNFRC 0.37 0.30 0.34 0.36  CREATININE 0.83  --  0.86 0.80    Estimated Creatinine Clearance: 60.7 ml/min (by C-G formula based on Cr of 0.8).   Assessment: 80 yo F on Eliquis PTA for hx afib/flutter now oh hold secondary to NPO status with SBO.  Last Eliquis dose was 7/3 PM.  Noted MDs desire to maintain patient at lower end of therapeutic goal range.  Heparin level this AM was therapeutic and at lower end of goal range as desired. Patient is tolerating clears. Advancing to fulls today. RN reports no issues with heparin drip.   Goal of Therapy:  Heparin level 0.3-0.7 units/ml -- target lower end of goal range Monitor platelets by anticoagulation protocol: Yes   Plan:  Continue heparin infusion at 1200 units/hr  Daily Anti-Xa level and CBC.  Monitor for s/s of bleeding  Per surgery, if she tolerates fulls and xrays are improves, she can likely be resumed on oral blood thinners.   Albertina Parr, PharmD.  Clinical Pharmacist Pager [phone]

## 2014-01-28 NOTE — Progress Notes (Signed)
[facility] Progress Note     Subjective: Pt feels good.  No N/V, abdominal pain resolved, distension less today.  Tolerating clears.  Ambulating well. Still nervous about advancing diet, but willing to try.     Objective: Vital signs in last 24 hours: Temp:  [97.7 F (36.5 C)-98.8 F (37.1 C)] 98.3 F (36.8 C) (07/09 0700) Pulse Rate:  [79-90] 85 (07/09 0755) Resp:  [17-25] 20 (07/09 0755) BP: (137-181)/(78-97) 158/78 mmHg (07/09 0755) SpO2:  [90 %-96 %] 91 % (07/09 0755) Weight:  [180 lb 12.4 oz (82 kg)-183 lb 3.2 oz (83.1 kg)] 180 lb 12.4 oz (82 kg) (07/09 0500) Last BM Date: 01/21/14  Intake/Output from previous day: 07/08 0701 - 07/09 0700 In: 4104.3 [P.O.:240; I.V.:3264.3; IV Piggyback:600] Out: 650 [Urine:650] Intake/Output this shift: Total I/O In: 102 [I.V.:102] Out: 375 [Urine:375]  PE: Gen: Alert, NAD, pleasant  Abd: Soft, mild distension, NT, +BS, no HSM   Lab Results:   Recent Labs  01/27/14 0248 01/28/14 0307  WBC 9.7 10.9*  HGB 11.7* 12.7  HCT 33.8* 35.4*  PLT 208 263   BMET  Recent Labs  01/27/14 0248 01/28/14 0307  NA 142 141  K 3.2* 3.7  CL 104 103  CO2 24 25  GLUCOSE 113* 127*  BUN 9 7  CREATININE 0.86 0.80  CALCIUM 8.6 9.0   PT/INR No results found for this basename: LABPROT, INR,  in the last 72 hours CMP     Component Value Date/Time   NA 141 01/28/2014 0307   NA 142 12/03/2013 0833   K 3.7 01/28/2014 0307   CL 103 01/28/2014 0307   CO2 25 01/28/2014 0307   GLUCOSE 127* 01/28/2014 0307   GLUCOSE 86 12/03/2013 0833   BUN 7 01/28/2014 0307   BUN 19 12/03/2013 0833   CREATININE 0.80 01/28/2014 0307   CALCIUM 9.0 01/28/2014 0307   PROT 6.8 01/26/2014 0435   PROT 7.0 12/03/2013 0833   ALBUMIN 3.0* 01/26/2014 0435   AST 15 01/26/2014 0435   ALT 8 01/26/2014 0435   ALKPHOS 68 01/26/2014 0435   BILITOT 0.6 01/26/2014 0435   GFRNONAA 68* 01/28/2014 0307   GFRAA 79* 01/28/2014 0307   Lipase  No results found for this basename: lipase        Studies/Results: No results found.  Anti-infectives: Anti-infectives   None       Assessment/Plan 1. SBO, improving   Plan:  1. NG tube removed, tolerating clears, advance to fulls 2. Mobilize and pulm toilet  3. SCD's and heparin  4. Repeat dulcolax.  Hopefully she will continue to improve without surgical interventions. 5. Recheck KUB 6.  If she tolerates fulls and xrays are improved can likely be resumed on blood thinners    LOS: 5 days    [name], Nicole Geremia 01/28/2014, 10:37 AM Pager: [phone]

## 2014-01-28 NOTE — Progress Notes (Signed)
PATIENT DETAILS Name: Nicole [name] Age: 80 y.o. Sex: female Date of Birth: 02/21/1931 Admit Date: 01/23/2014 Admitting Physician Domenic Polite, MD PNT:IRWE, [facility], DO  Brief Summary 80 y.o. female with PMH of chronic systolic CHF (EF of 31%), DM, CKD, Chronic afib on apixaban, h/o cholecystectomy (10/13) and surgery for Colon CA in '99 who presented to the ER with the abdom pain, was found to have SBO. Hospital course complicated by development of Afib RVR-necessitating a Cardizem gtt and transfer to SDU.  Subjective: No major complains. Improving  Assessment/Plan: Small bowel obstruction  -likely secondary to adhesions  -passing some flatus  -ongoing care per Gen Surgery -diet being advanced to full liquids  Chronic Afib / Aflutter w/ acute RVR  -oral cardizem on hold on admission as NPO - IV Lopressor + cardizem gtt, since diet being advanced to full liquids-continue with IV Cardizem gtt, once diet stabilizes resume oral cardizem -IV Heparin per pharmacy - Eliquis on hold   Hypokalemia  -replete w/ goal of 4.0 or > to maximize smooth muscle contractility   Hypomagnesemia  repleted to goal of 2.0   Chronic Systolic CHF - recent EF 45- 50%  -clinically compensaated  -watch closely while on IVF, lasix remains on hold as pt NPO   Type II diabetes mellitus  -CBG's stable - continue SSI   Uncontrolled HTN  -scheduled BB - adjust medical tx and follow   Known thoracic aortic aneurysm   Hx colon CA s/p resection '99   Hypothyroidism  -c/w IV Levothyroxine   CKD 3  -stable  Disposition: Remain inpatient  DVT Prophylaxis: On IV heparin  Code Status: Full code   Family Communication None at bedside  Procedures:  None  CONSULTS:  general surgery  Time spent 40 minutes-which includes 50% of the time with face-to-face with patient/ family and coordinating care related to the above assessment and plan.  MEDICATIONS: Scheduled Meds: .  antiseptic oral rinse  15 mL Mouth Rinse q12n4p  . bisacodyl  10 mg Rectal Daily  . chlorhexidine  15 mL Mouth Rinse BID  . insulin aspart  0-9 Units Subcutaneous 6 times per day  . levothyroxine  25 mcg Intravenous Daily  . metoprolol  10 mg Intravenous 4 times per day   Continuous Infusions: . 0.9 % NaCl with KCl 20 mEq / L 80 mL/hr at 01/28/14 0647  . diltiazem (CARDIZEM) infusion 10 mg/hr (01/28/14 0845)  . heparin 1,200 Units/hr (01/28/14 0600)   PRN Meds:.acetaminophen, hydrALAZINE, hydrocortisone cream, morphine injection, ondansetron (ZOFRAN) IV, phenol  Antibiotics: Anti-infectives   None       PHYSICAL EXAM: Vital signs in last 24 hours: Filed Vitals:   01/28/14 0500 01/28/14 0700 01/28/14 0755 01/28/14 1108  BP:   158/78 146/84  Pulse:   85 89  Temp:  98.3 F (36.8 C)  98.6 F (37 C)  TempSrc:  Oral  Oral  Resp:   20 26  Height:      Weight: 82 kg (180 lb 12.4 oz)     SpO2:   91% 93%    Weight change: 1 kg (2 lb 3.3 oz) Filed Weights   01/27/14 0425 01/28/14 0308 01/28/14 0500  Weight: 82.1 kg (181 lb) 83.1 kg (183 lb 3.2 oz) 82 kg (180 lb 12.4 oz)   Body mass index is 25.94 kg/(m^2).   Gen Exam: Awake and alert with clear speech.   Neck: Supple, No JVD.   Chest:  B/L Clear.   CVS: S1 S2 Regular, no murmurs.  Abdomen: soft, BS +, non tender, non distended.  Extremities: no edema, lower extremities warm to touch Neurologic: Non Focal.   Skin: No Rash.   Wounds: N/A.    Intake/Output from previous day:  Intake/Output Summary (Last 24 hours) at 01/28/14 1343 Last data filed at 01/28/14 0900  Gross per 24 hour  Intake   1684 ml  Output    925 ml  Net    759 ml     LAB RESULTS: CBC  Recent Labs Lab 01/23/14 0632 01/24/14 0445 01/25/14 0250 01/26/14 0435 01/27/14 0248 01/28/14 0307  WBC 11.1* 12.3* 7.2 10.3 9.7 10.9*  HGB 14.5 14.2 12.3 12.4 11.7* 12.7  HCT 39.9 40.1 35.3* 35.9* 33.8* 35.4*  PLT 271 285 252 237 208 263  MCV 82.4 83.0  83.5 84.1 83.7 83.1  MCH 30.0 29.4 29.1 29.0 29.0 29.8  MCHC 36.3* 35.4 34.8 34.5 34.6 35.9  RDW 14.9 15.0 15.0 15.1 14.8 14.9  LYMPHSABS 1.7  --   --   --   --   --   MONOABS 0.6  --   --   --   --   --   EOSABS 0.0  --   --   --   --   --   BASOSABS 0.0  --   --   --   --   --     Chemistries   Recent Labs Lab 01/24/14 0445 01/25/14 0250 01/26/14 0435 01/27/14 0248 01/28/14 0307  NA 143 144 143 142 141  K 3.8 3.2* 3.1* 3.2* 3.7  CL 100 104 102 104 103  CO2 27 26 24 24 25   GLUCOSE 175* 123* 121* 113* 127*  BUN 19 15 9 9 7   CREATININE 1.21* 1.06 0.83 0.86 0.80  CALCIUM 9.5 8.7 8.9 8.6 9.0  MG  --   --  1.4* 2.0 1.7    CBG:  Recent Labs Lab 01/27/14 1948 01/27/14 2333 01/28/14 0355 01/28/14 0801 01/28/14 1128  GLUCAP 177* 116* 121* 123* 131*    GFR Estimated Creatinine Clearance: 60.7 ml/min (by C-G formula based on Cr of 0.8).  Coagulation profile  Recent Labs Lab 01/24/14 0445  INR 1.13    Cardiac Enzymes No results found for this basename: CK, CKMB, TROPONINI, MYOGLOBIN,  in the last 168 hours  No components found with this basename: POCBNP,  No results found for this basename: DDIMER,  in the last 72 hours  Recent Labs  01/26/14 0435  HGBA1C 5.5   No results found for this basename: CHOL, HDL, LDLCALC, TRIG, CHOLHDL, LDLDIRECT,  in the last 72 hours No results found for this basename: TSH, T4TOTAL, FREET3, T3FREE, THYROIDAB,  in the last 72 hours No results found for this basename: VITAMINB12, FOLATE, FERRITIN, TIBC, IRON, RETICCTPCT,  in the last 72 hours No results found for this basename: LIPASE, AMYLASE,  in the last 72 hours  Urine Studies No results found for this basename: UACOL, UAPR, USPG, UPH, UTP, UGL, UKET, UBIL, UHGB, UNIT, UROB, ULEU, UEPI, UWBC, URBC, UBAC, CAST, CRYS, UCOM, BILUA,  in the last 72 hours  MICROBIOLOGY: No results found for this or any previous visit (from the past 240 hour(s)).  RADIOLOGY STUDIES/RESULTS: Ct  Abdomen Pelvis Wo Contrast  01/23/2014   CLINICAL DATA:  Epigastric pain and nausea. History of bowel resection for colon cancer.  EXAM: CT ABDOMEN AND PELVIS WITHOUT CONTRAST  TECHNIQUE: Multidetector CT imaging of  the abdomen and pelvis was performed following the standard protocol without IV contrast.  COMPARISON:  CT abdomen and pelvis with contrast 02/09/2013.  FINDINGS: Mild dependent atelectasis is present at the lung bases bilaterally. No focal nodule, mass, or airspace disease is present. The heart is mildly enlarged without significant change. Atherosclerotic calcifications are present in the aorta without aneurysm.  Minimal fluid is present around the liver. The liver is otherwise unremarkable. Patient is status post splenectomy. Multiple soft tissue nodules are stable, reflecting residual splenules. Contrast is present within the stomach. The duodenum is unremarkable. The pancreas is within normal limits. There is significant interval enlargement of the common bile duct, now measuring 2.4 cm. No obstructing mass is evident. The adrenal glands are normal bilaterally. Renal atrophy is stable. The ureters are within normal limits.  The rectosigmoid colon is within normal limits. The left lower quadrant anastomosis is intact. The more proximal colon is within normal limits as well. The appendix is visualized and normal.  Dilated loops of small bowel are present in the mid abdomen with gradual dilution of contrast. The transition point is in the low anterior pelvis appears to be associated with adhesions. No obstructing mass lesion is present. There is no free fluid in the pelvis. The patient is status post hysterectomy. The ovaries are visualized and appear to be within normal limits.  The bone windows demonstrate partial fusion along the SI joints bilaterally. Mild degenerative changes are noted in the lumbar spine. No focal lytic or blastic lesions are evident.  IMPRESSION: 1. Partial small bowel  obstruction with transition point in the mid lower abdomen likely related to adhesions. 2. Stable appearance of the chronic CT anastomosis. 3. Atherosclerosis. 4. Significant interval increase and the size the common bile duct without an obstructing lesion. 5. Mild cardiomegaly without failure.   Electronically Signed   By: Lawrence Santiago M.D.   On: 01/23/2014 12:10   Dg Abd 1 View  01/24/2014   CLINICAL DATA:  Follow-up small bowel obstruction.  EXAM: ABDOMEN - 1 VIEW  COMPARISON:  01/23/2014  FINDINGS: Supine images of the abdomen were obtained. Nasogastric has been placed and the tip is in the gastric body region. There continues to be dilated loops of small bowel which contain air and oral contrast. There is stool in the colon but no significant contrast in the colon. There may be slightly increased distention of small bowel loops compared to the recent CT examination. Limited evaluation for free air on the supine images.  IMPRESSION: Persistent dilated loops of small bowel and there may be slightly increased distention of the small bowel loops. Findings remain compatible with a small bowel obstruction.  Nasogastric tube in the stomach body region.   Electronically Signed   By: Markus Daft M.D.   On: 01/24/2014 10:50   Dg Abd Portable 1v  01/28/2014   CLINICAL DATA:  Recent bowel obstruction  EXAM: PORTABLE ABDOMEN - 1 VIEW  COMPARISON:  January 25, 2014  FINDINGS: Nasogastric tube is been removed. There is contrast in the colon. Currently there is no appreciable bowel dilatation. No air-fluid levels are seen. No free air is seen on this supine examination. There are a few small phleboliths in the pelvis.  IMPRESSION: Bowel gas pattern is felt to be unremarkable currently. There is contrast in the colon. No obstruction or free air is seen on this supine examination.   Electronically Signed   By: Lowella Grip M.D.   On: 01/28/2014 13:01  Dg Abd Portable 2v  01/25/2014   CLINICAL DATA:  Abdominal pain.   EXAM: PORTABLE ABDOMEN - 2 VIEW  COMPARISON:  01/24/2014  FINDINGS: Decrease in prominence of small bowel loops without evidence of progressive small bowel obstruction. No free air is identified. Nasogastric tube continues to extend into the stomach.  IMPRESSION: Decrease in an dilatation of small bowel loops.  No free air.   Electronically Signed   By: Aletta Edouard M.D.   On: 01/25/2014 08:07    Oren Binet, MD  [facility] Pager:[phone]  If 7PM-7AM, please contact night-coverage [email or web address] Password [email or web address] 01/28/2014, 1:43 PM   LOS: 5 days   **Disclaimer: This note may have been dictated with voice recognition software. Similar sounding words can inadvertently be transcribed and this note may contain transcription errors which may not have been corrected upon publication of note.**

## 2014-01-28 NOTE — Progress Notes (Signed)
Held pressure to pts nose for 15 mins and nose bleed subsided.

## 2014-01-29 LAB — HEPARIN LEVEL (UNFRACTIONATED)

## 2014-01-29 LAB — GLUCOSE, CAPILLARY
GLUCOSE-CAPILLARY: 131 mg/dL — AB (ref 70–99)
GLUCOSE-CAPILLARY: 127 mg/dL — AB (ref 70–99)
GLUCOSE-CAPILLARY: 108 mg/dL — AB (ref 70–99)
GLUCOSE-CAPILLARY: 179 mg/dL — AB (ref 70–99)
Glucose-Capillary: 167 mg/dL — ABNORMAL HIGH (ref 70–99)
Glucose-Capillary: 117 mg/dL — ABNORMAL HIGH (ref 70–99)

## 2014-01-29 LAB — CBC
HCT: 35.2 % — ABNORMAL LOW (ref 36.0–46.0)
Hemoglobin: 12.3 g/dL (ref 12.0–15.0)
MCH: 28.9 pg (ref 26.0–34.0)
MCHC: 34.9 g/dL (ref 30.0–36.0)
MCV: 82.8 fL (ref 78.0–100.0)
Platelets: 257 10*3/uL (ref 150–400)
RBC: 4.25 MIL/uL (ref 3.87–5.11)
RDW: 14.9 % (ref 11.5–15.5)
WBC: 8.2 10*3/uL (ref 4.0–10.5)

## 2014-01-29 LAB — BASIC METABOLIC PANEL
ANION GAP: 16 — AB (ref 5–15)
BUN: 6 mg/dL (ref 6–23)
CALCIUM: 8.9 mg/dL (ref 8.4–10.5)
CO2: 24 mEq/L (ref 19–32)
Chloride: 102 mEq/L (ref 96–112)
Creatinine, Ser: 0.85 mg/dL (ref 0.50–1.10)
GFR calc non Af Amer: 63 mL/min — ABNORMAL LOW (ref >90)
GFR, EST AFRICAN AMERICAN: 73 mL/min — AB (ref >90)
Glucose, Bld: 121 mg/dL — ABNORMAL HIGH (ref 70–99)
Potassium: 3.4 mEq/L — ABNORMAL LOW (ref 3.7–5.3)
SODIUM: 142 meq/L (ref 137–147)

## 2014-01-29 LAB — MAGNESIUM: MAGNESIUM: 1.5 mg/dL (ref 1.5–2.5)

## 2014-01-29 MED ORDER — LEVOTHYROXINE SODIUM 50 MCG PO TABS
50.0000 ug | ORAL_TABLET | Freq: Every day | ORAL | Status: DC
  Administered 2014-01-30 – 2014-01-31 (×2): 50 ug via ORAL
  Filled 2014-01-29 (×4): qty 1

## 2014-01-29 MED ORDER — POLYETHYLENE GLYCOL 3350 17 G PO PACK
17.0000 g | PACK | Freq: Every day | ORAL | Status: DC
  Administered 2014-01-29 – 2014-01-31 (×3): 17 g via ORAL
  Filled 2014-01-29 (×3): qty 1

## 2014-01-29 MED ORDER — POTASSIUM CHLORIDE 10 MEQ/100ML IV SOLN
10.0000 meq | INTRAVENOUS | Status: AC
  Administered 2014-01-29 (×6): 10 meq via INTRAVENOUS
  Filled 2014-01-29 (×6): qty 100

## 2014-01-29 MED ORDER — METOPROLOL TARTRATE 25 MG PO TABS
25.0000 mg | ORAL_TABLET | Freq: Two times a day (BID) | ORAL | Status: DC
  Administered 2014-01-30 – 2014-01-31 (×3): 25 mg via ORAL
  Filled 2014-01-29 (×5): qty 1

## 2014-01-29 MED ORDER — DILTIAZEM HCL ER COATED BEADS 180 MG PO CP24
180.0000 mg | ORAL_CAPSULE | Freq: Every day | ORAL | Status: DC
  Administered 2014-01-29 – 2014-01-30 (×2): 180 mg via ORAL
  Filled 2014-01-29 (×3): qty 1

## 2014-01-29 MED ORDER — MAGNESIUM SULFATE 40 MG/ML IJ SOLN
2.0000 g | Freq: Once | INTRAMUSCULAR | Status: AC
  Administered 2014-01-29: 2 g via INTRAVENOUS
  Filled 2014-01-29: qty 50

## 2014-01-29 MED ORDER — APIXABAN 5 MG PO TABS
5.0000 mg | ORAL_TABLET | Freq: Two times a day (BID) | ORAL | Status: DC
  Administered 2014-01-29 – 2014-01-31 (×5): 5 mg via ORAL
  Filled 2014-01-29 (×6): qty 1

## 2014-01-29 MED ORDER — INSULIN ASPART 100 UNIT/ML ~~LOC~~ SOLN
0.0000 [IU] | Freq: Three times a day (TID) | SUBCUTANEOUS | Status: DC
  Administered 2014-01-29: 2 [IU] via SUBCUTANEOUS

## 2014-01-29 MED ORDER — FLEET ENEMA 7-19 GM/118ML RE ENEM: 1.0000 | ENEMA | Freq: Once | RECTAL | Status: DC

## 2014-01-29 MED ORDER — INSULIN ASPART 100 UNIT/ML ~~LOC~~ SOLN: 0.0000 [IU] | Freq: Three times a day (TID) | SUBCUTANEOUS | Status: DC

## 2014-01-29 NOTE — Progress Notes (Signed)
Will try Miralax PO.   Continuing to pass flatus, abdomen soft  Imogene Burn. Georgette Dover, MD, [facility]  General/ Trauma Surgery  01/29/2014 2:01 PM

## 2014-01-29 NOTE — Progress Notes (Signed)
Nicole [name]  Nicole [name] POE:[id number] DOB: 1930-10-22 DOA: 01/23/2014 PCP: Hollace Kinnier, DO  Admit HPI / Brief Narrative: 80 y.o. female with PMH of chronic systolic CHF (EF of 31%), DM, CKD, Chronic afib on apixaban, h/o cholecystectomy (10/13) and surgery for Colon CA in '99 who presented to the ER with the abdom pain assoc w/ N/V. She'd had intermittent abdominal pain on and off for about 1 month, but worsened the day prior to her admit, and became accompanied by nausea and vomiting. Due to persistence of symptoms she presented to the ER.  In ER, she was noted to have abd distension, tenderness and CT with PSBO with transition point.  HPI/Subjective: Pt continues to pass gas, but has not yet had a BM.  She has no new complaints.  Denies sob, n/v, or abdom pain.    Assessment/Plan:  Small bowel obstruction -likely secondary to adhesions  -still passing some flatus  -ongoing care per Gen Surgery - diet being advanced and tolerated thus far   Chronic Afib / Aflutter w/ acute RVR -attempt to transition back to oral meds today, to include anticoag   Hypokalemia -replete w/ goal of 4.0 or > to maximize smooth muscle contractility - needs further dosing today   Hypomagnesemia  -replete to goal of 2.0 - dose again today   Chronic Systolic CHF - recent EF 45- 50% -clinically compensated, but +8L though weight only slightly elevated (78.9 kg at admit > 80.0kg today) -stop IVF but not yet ready to resume lasic  Type II diabetes mellitus  -CBG's stable - continue SSI   Uncontrolled HTN -scheduled BB - adjust medical tx again today and follow   Known thoracic aortic aneurysm   Hx colon CA s/p resection '99  Hypothyroidism  -transition back to oral med  CKD 3  -stable  Code Status: FULL Family Communication: no family present at time of exam today  Disposition Plan: SDU  Consultants: Gen Surgery    Procedures: none  Antibiotics: none  DVT prophylaxis: IV heparin > Eliquis   Objective: Blood pressure 169/97, pulse 70, temperature 97.7 F (36.5 C), temperature source Oral, resp. rate 18, height 5\' 10"  (1.778 m), weight 80.8 kg (178 lb 2.1 oz), SpO2 98.00%.  Intake/Output Summary (Last 24 hours) at 01/29/14 1113 Last data filed at 01/29/14 0600  Gross per 24 hour  Intake   1080 ml  Output    600 ml  Net    480 ml   Exam: General: No acute respiratory distress Lungs: Clear to auscultation bilaterally without wheezes or crackles Cardiovascular: irreg irreg - rate ~80 - no appreciable M or rub  Abdomen: Nontender, distended, soft, bowel sounds minimal, no rebound, no ascites, no appreciable mass Extremities: No significant cyanosis, clubbing, or edema bilateral lower extremities  Data Reviewed: Basic Metabolic Panel:  Recent Labs Lab 01/25/14 0250 01/26/14 0435 01/27/14 0248 01/28/14 0307 01/29/14 0414  NA 144 143 142 141 142  K 3.2* 3.1* 3.2* 3.7 3.4*  CL 104 102 104 103 102  CO2 26 24 24 25 24   GLUCOSE 123* 121* 113* 127* 121*  BUN 15 9 9 7 6   CREATININE 1.06 0.83 0.86 0.80 0.85  CALCIUM 8.7 8.9 8.6 9.0 8.9  MG  --  1.4* 2.0 1.7 1.5   Liver Function Tests:  Recent Labs Lab 01/23/14 0632 01/24/14 0445 01/26/14 0435  AST 23 19 15   ALT 10 12 8   ALKPHOS 92 91 68  BILITOT 0.8 0.8 0.6  PROT 7.9 7.5 6.8  ALBUMIN 4.1 3.6 3.0*   CBC:  Recent Labs Lab 01/23/14 0632  01/25/14 0250 01/26/14 0435 01/27/14 0248 01/28/14 0307 01/29/14 0414  WBC 11.1*  < > 7.2 10.3 9.7 10.9* 8.2  NEUTROABS 8.8*  --   --   --   --   --   --   HGB 14.5  < > 12.3 12.4 11.7* 12.7 12.3  HCT 39.9  < > 35.3* 35.9* 33.8* 35.4* 35.2*  MCV 82.4  < > 83.5 84.1 83.7 83.1 82.8  PLT 271  < > 252 237 208 263 257  < > = values in this interval not displayed.  CBG:  Recent Labs Lab 01/28/14 1448 01/28/14 1918 01/28/14 2316 01/29/14 0344 01/29/14 0804  GLUCAP 116* 134* 120*  131* 127*    Studies:  Recent x-ray studies have been reviewed in detail by the Attending Physician  Scheduled Meds:  Scheduled Meds: . antiseptic oral rinse  15 mL Mouth Rinse q12n4p  . bisacodyl  10 mg Rectal Daily  . chlorhexidine  15 mL Mouth Rinse BID  . insulin aspart  0-9 Units Subcutaneous 6 times per day  . levothyroxine  25 mcg Intravenous Daily  . metoprolol  10 mg Intravenous 4 times per day  . oxymetazoline  1 spray Each Nare BID  . sodium phosphate  1 enema Rectal Once    Time spent on care of this patient: 35 mins   Meera Vasco T , MD   [facility] Office  [phone] Pager - Text Page per Shea Evans as per below:  On-Call/Text Page:      [email or web address]      password [email or web address]  If 7PM-7AM, please contact night-coverage [email or web address] Password [email or web address] 01/29/2014, 11:13 AM   LOS: 6 days

## 2014-01-29 NOTE — Progress Notes (Signed)
ANTICOAGULATION CONSULT NOTE - Follow Up Consult  Pharmacy Consult for Eliquis  Indication: atrial fibrillation  Allergies  Allergen Reactions  . Iohexol      Code: VOM, Desc: PT REPORTS VOMITING W/ IVP DYE- ARS 12/26/08, Onset Date: [id number]   . Z-Pak [Azithromycin]   . Iodinated Diagnostic Agents Nausea Only    PT HAS HAD SINGULAR INCIDENT OF NAUSEA  W/ IV CONTYRAST INJECTION, NONE IF INJECTED SLOWLY//A.[place]    Patient Measurements: Height: 5\' 10"  (177.8 cm) Weight: 178 lb 2.1 oz (80.8 kg) IBW/kg (Calculated) : 68.5 Heparin Dosing Weight: 80 kg  Vital Signs: Temp: 97.7 F (36.5 C) (07/10 0754) Temp src: Oral (07/10 0754) BP: 181/90 mmHg (07/10 0707) Pulse Rate: 71 (07/10 0707)  Labs:  Recent Labs  01/26/14 2009  01/27/14 0248 01/28/14 0307 01/29/14 0414  HGB  --   < > 11.7* 12.7 12.3  HCT  --   --  33.8* 35.4* 35.2*  PLT  --   --  208 263 257  APTT 70*  --   --   --   --   HEPARINUNFRC 0.30  --  0.34 0.36 <0.10*  CREATININE  --   --  0.86 0.80 0.85  < > = values in this interval not displayed.  Estimated Creatinine Clearance: 57.1 ml/min (by C-G formula based on Cr of 0.85).   Assessment: 80 yo F on Eliquis PTA for hx afib/flutter which was on hold secondary to NPO status with SBO. Patient was then put on a heparin drip which was stopped yesterday secondary to nose-bleed which has resolved. Patient now tolerating fulls. Pharmacy consulted to resume Eliquis. Age 80, Wt 80 kg, SCr 0.85.   Goal of Therapy:  Stroke prevention  Monitor platelets by anticoagulation protocol: Yes   Plan:  Resume Eliquis at home dose of 5 mg twice daily Monitor CBC, renal fx and s/s of bleeding  F/u if patient continues to tolerate orals   Albertina Parr, PharmD.  Clinical Pharmacist Pager [phone]

## 2014-01-29 NOTE — Progress Notes (Signed)
Patient ID: Nicole [name], female   DOB: 01-Jun-1931, 80 y.o.   MRN: [id number]   Subjective: She is not getting oob much due to urinary incontinence.  D/w nurse to provide a pad/mesh panties to encourage mobilization.  She is passing flatus, no BM yet following dulcolax suppository.  She is hungry.  Tolerating full liquid diet.  Objective:  Vital signs:  Filed Vitals:   01/28/14 2317 01/29/14 0344 01/29/14 0419 01/29/14 0754  BP: 176/94 169/97    Pulse: 68 70    Temp: 98 F (36.7 C) 98 F (36.7 C)  97.7 F (36.5 C)  TempSrc: Oral Oral  Oral  Resp: 20 18    Height:      Weight:   178 lb 2.1 oz (80.8 kg)   SpO2: 96% 98%      Last BM Date: 01/21/14  Intake/Output   Yesterday:  07/09 0701 - 07/10 0700 In: 7654 [I.V.:1422] Out: 975 [Urine:975] This shift:    I/O last 3 completed shifts: In: 2824 [I.V.:2324; IV Piggyback:500] Out: 44 [Urine:975]    Physical Exam: General: Pt awake/alert/oriented x4 in no acute distress Abdomen: Soft.  Nondistended. Non tender.  Midline scar.  No evidence of peritonitis.  No incarcerated hernias.   Problem List:   Principal Problem:   SBO (small bowel obstruction) Active Problems:   History of colon cancer, stage III   CHF, past NICM with an EF of 30%, most recent echo 2011 showed EF to be45- 50%   CAF not felt to be a Coumadin candidate in the past, rate currently controlled   Type II or unspecified type diabetes mellitus with neurological manifestations, not stated as uncontrolled   Diabetic nephropathy    Results:   Labs: Results for orders placed during the hospital encounter of 01/23/14 (from the past 48 hour(s))  GLUCOSE, CAPILLARY     Status: Abnormal   Collection Time    01/27/14 11:47 AM      Result Value Ref Range   Glucose-Capillary 161 (*) 70 - 99 mg/dL   Comment 1 Notify RN     Comment 2 Documented in Chart    GLUCOSE, CAPILLARY     Status: Abnormal   Collection Time    01/27/14  3:20 PM      Result Value  Ref Range   Glucose-Capillary 147 (*) 70 - 99 mg/dL  GLUCOSE, CAPILLARY     Status: Abnormal   Collection Time    01/27/14  7:48 PM      Result Value Ref Range   Glucose-Capillary 177 (*) 70 - 99 mg/dL  GLUCOSE, CAPILLARY     Status: Abnormal   Collection Time    01/27/14 11:33 PM      Result Value Ref Range   Glucose-Capillary 116 (*) 70 - 99 mg/dL  HEPARIN LEVEL (UNFRACTIONATED)     Status: None   Collection Time    01/28/14  3:07 AM      Result Value Ref Range   Heparin Unfractionated 0.36  0.30 - 0.70 IU/mL   Comment:            IF HEPARIN RESULTS ARE BELOW     EXPECTED VALUES, AND PATIENT     DOSAGE HAS BEEN CONFIRMED,     SUGGEST FOLLOW UP TESTING     OF ANTITHROMBIN III LEVELS.  CBC     Status: Abnormal   Collection Time    01/28/14  3:07 AM      Result Value Ref  Range   WBC 10.9 (*) 4.0 - 10.5 K/uL   RBC 4.26  3.87 - 5.11 MIL/uL   Hemoglobin 12.7  12.0 - 15.0 g/dL   HCT 35.4 (*) 36.0 - 46.0 %   MCV 83.1  78.0 - 100.0 fL   MCH 29.8  26.0 - 34.0 pg   MCHC 35.9  30.0 - 36.0 g/dL   RDW 14.9  11.5 - 15.5 %   Platelets 263  150 - 400 K/uL  MAGNESIUM     Status: None   Collection Time    01/28/14  3:07 AM      Result Value Ref Range   Magnesium 1.7  1.5 - 2.5 mg/dL  BASIC METABOLIC PANEL     Status: Abnormal   Collection Time    01/28/14  3:07 AM      Result Value Ref Range   Sodium 141  137 - 147 mEq/L   Potassium 3.7  3.7 - 5.3 mEq/L   Chloride 103  96 - 112 mEq/L   CO2 25  19 - 32 mEq/L   Glucose, Bld 127 (*) 70 - 99 mg/dL   BUN 7  6 - 23 mg/dL   Creatinine, Ser 0.80  0.50 - 1.10 mg/dL   Calcium 9.0  8.4 - 10.5 mg/dL   GFR calc non Af Amer 68 (*) >90 mL/min   GFR calc Af Amer 79 (*) >90 mL/min   Comment: (NOTE)     The eGFR has been calculated using the CKD EPI equation.     This calculation has not been validated in all clinical situations.     eGFR's persistently <90 mL/min signify possible Chronic Kidney     Disease.   Anion gap 13  5 - 15  GLUCOSE,  CAPILLARY     Status: Abnormal   Collection Time    01/28/14  3:55 AM      Result Value Ref Range   Glucose-Capillary 121 (*) 70 - 99 mg/dL  GLUCOSE, CAPILLARY     Status: Abnormal   Collection Time    01/28/14  8:01 AM      Result Value Ref Range   Glucose-Capillary 123 (*) 70 - 99 mg/dL   Comment 1 Notify RN     Comment 2 Documented in Chart    GLUCOSE, CAPILLARY     Status: Abnormal   Collection Time    01/28/14 11:28 AM      Result Value Ref Range   Glucose-Capillary 131 (*) 70 - 99 mg/dL   Comment 1 Notify RN     Comment 2 Documented in Chart    GLUCOSE, CAPILLARY     Status: Abnormal   Collection Time    01/28/14  2:48 PM      Result Value Ref Range   Glucose-Capillary 116 (*) 70 - 99 mg/dL   Comment 1 Notify RN     Comment 2 Documented in Chart    GLUCOSE, CAPILLARY     Status: Abnormal   Collection Time    01/28/14  7:18 PM      Result Value Ref Range   Glucose-Capillary 134 (*) 70 - 99 mg/dL   Comment 1 Documented in Chart     Comment 2 Notify RN    GLUCOSE, CAPILLARY     Status: Abnormal   Collection Time    01/28/14 11:16 PM      Result Value Ref Range   Glucose-Capillary 120 (*) 70 - 99 mg/dL  GLUCOSE, CAPILLARY  Status: Abnormal   Collection Time    01/29/14  3:44 AM      Result Value Ref Range   Glucose-Capillary 131 (*) 70 - 99 mg/dL   Comment 1 Documented in Chart     Comment 2 Notify RN    HEPARIN LEVEL (UNFRACTIONATED)     Status: Abnormal   Collection Time    01/29/14  4:14 AM      Result Value Ref Range   Heparin Unfractionated <0.10 (*) 0.30 - 0.70 IU/mL   Comment:            IF HEPARIN RESULTS ARE BELOW     EXPECTED VALUES, AND PATIENT     DOSAGE HAS BEEN CONFIRMED,     SUGGEST FOLLOW UP TESTING     OF ANTITHROMBIN III LEVELS.  CBC     Status: Abnormal   Collection Time    01/29/14  4:14 AM      Result Value Ref Range   WBC 8.2  4.0 - 10.5 K/uL   RBC 4.25  3.87 - 5.11 MIL/uL   Hemoglobin 12.3  12.0 - 15.0 g/dL   HCT 35.2 (*) 36.0  - 46.0 %   MCV 82.8  78.0 - 100.0 fL   MCH 28.9  26.0 - 34.0 pg   MCHC 34.9  30.0 - 36.0 g/dL   RDW 14.9  11.5 - 15.5 %   Platelets 257  150 - 400 K/uL  MAGNESIUM     Status: None   Collection Time    01/29/14  4:14 AM      Result Value Ref Range   Magnesium 1.5  1.5 - 2.5 mg/dL  BASIC METABOLIC PANEL     Status: Abnormal   Collection Time    01/29/14  4:14 AM      Result Value Ref Range   Sodium 142  137 - 147 mEq/L   Potassium 3.4 (*) 3.7 - 5.3 mEq/L   Chloride 102  96 - 112 mEq/L   CO2 24  19 - 32 mEq/L   Glucose, Bld 121 (*) 70 - 99 mg/dL   BUN 6  6 - 23 mg/dL   Creatinine, Ser 0.85  0.50 - 1.10 mg/dL   Calcium 8.9  8.4 - 10.5 mg/dL   GFR calc non Af Amer 63 (*) >90 mL/min   GFR calc Af Amer 73 (*) >90 mL/min   Comment: (NOTE)     The eGFR has been calculated using the CKD EPI equation.     This calculation has not been validated in all clinical situations.     eGFR's persistently <90 mL/min signify possible Chronic Kidney     Disease.   Anion gap 16 (*) 5 - 15    Imaging / Studies: Dg Abd Portable 1v  01/28/2014   CLINICAL DATA:  Recent bowel obstruction  EXAM: PORTABLE ABDOMEN - 1 VIEW  COMPARISON:  January 25, 2014  FINDINGS: Nasogastric tube is been removed. There is contrast in the colon. Currently there is no appreciable bowel dilatation. No air-fluid levels are seen. No free air is seen on this supine examination. There are a few small phleboliths in the pelvis.  IMPRESSION: Bowel gas pattern is felt to be unremarkable currently. There is contrast in the colon. No obstruction or free air is seen on this supine examination.   Electronically Signed   By: Lowella Grip M.D.   On: 01/28/2014 13:01    Scheduled Meds: . antiseptic oral rinse  15  mL Mouth Rinse q12n4p  . bisacodyl  10 mg Rectal Daily  . chlorhexidine  15 mL Mouth Rinse BID  . insulin aspart  0-9 Units Subcutaneous 6 times per day  . levothyroxine  25 mcg Intravenous Daily  . metoprolol  10 mg  Intravenous 4 times per day  . oxymetazoline  1 spray Each Nare BID  . sodium phosphate  1 enema Rectal Once   Continuous Infusions: . 0.9 % NaCl with KCl 20 mEq / L 50 mL/hr at 01/28/14 2110  . diltiazem (CARDIZEM) infusion 10 mg/hr (01/29/14 0600)   PRN Meds:.acetaminophen, hydrALAZINE, hydrocortisone cream, morphine injection, ondansetron (ZOFRAN) IV, phenol   Antibiotics: Anti-infectives   None       Patient Active Problem List   Diagnosis Date Noted  . SBO (small bowel obstruction) 01/23/2014  . Diabetic nephropathy 09/07/2013  . Chronic constipation 03/25/2013  . Carpal tunnel syndrome 02/25/2013  . Hypothyroidism   . Gout flare 06/01/2012  . Leakage of common bile duct s/p ERCP / stent 05/30/2012  . Ileus post op. still NPO 05/22/2012  . Bile leak, postoperative 05/17/2012  . Hypokalemia 05/17/2012  . Atrial fibrillation with RVR 05/16/2012  . Postoperative ileus 05/16/2012  . Small bowel obstruction due to adhesions 05/16/2012  . Acute on chronic renal insufficiency, improved 05/15/2012  . Normal coronary arteries, 2008 05/15/2012  . Type II or unspecified type diabetes mellitus with neurological manifestations, not stated as uncontrolled 05/15/2012  . HTN (hypertension) 05/15/2012  . Respiratory distress post op secondary to rapid AF 05/15/2012  . ARF (acute renal failure) 05/15/2012  . Abdominal pain, acute, right upper quadrant 05/12/2012  . Nausea and vomiting 05/12/2012  . Dehydration 05/12/2012  . Cholecystitis chronic, acute, S/P lap cholecystectomy 05/14/12 05/12/2012  . Thoracic aortic aneurysm, 4.8cm 2011   . CHF, past NICM with an EF of 30%, most recent echo 2011 showed EF to be45- 50%   . CAF not felt to be a Coumadin candidate in the past, rate currently controlled   . Anemia 08/09/2011  . History of colon cancer, stage III 08/09/2011     Assessment/Plan  SBO likely due to adhesions -clinically improving, passing flatus, non tender.  AXR 3/9  with contrast in the colon.  Give enema x1 to help stimulate from below. -advance to a soft diet  -encourage ambulation, d/w RN -further management per primary team   Erby Pian, [facility] Surgery Pager [phone] Office [phone]  01/29/2014 10:15 AM

## 2014-01-30 LAB — BASIC METABOLIC PANEL WITH GFR
Anion gap: 15 (ref 5–15)
BUN: 6 mg/dL (ref 6–23)
CO2: 25 meq/L (ref 19–32)
Calcium: 9.1 mg/dL (ref 8.4–10.5)
Chloride: 102 meq/L (ref 96–112)
Creatinine, Ser: 0.81 mg/dL (ref 0.50–1.10)
GFR calc Af Amer: 77 mL/min — ABNORMAL LOW
GFR calc non Af Amer: 67 mL/min — ABNORMAL LOW
Glucose, Bld: 117 mg/dL — ABNORMAL HIGH (ref 70–99)
Potassium: 3.5 meq/L — ABNORMAL LOW (ref 3.7–5.3)
Sodium: 142 meq/L (ref 137–147)

## 2014-01-30 LAB — GLUCOSE, CAPILLARY
Glucose-Capillary: 130 mg/dL — ABNORMAL HIGH (ref 70–99)
Glucose-Capillary: 204 mg/dL — ABNORMAL HIGH (ref 70–99)
Glucose-Capillary: 105 mg/dL — ABNORMAL HIGH (ref 70–99)
Glucose-Capillary: 130 mg/dL — ABNORMAL HIGH (ref 70–99)

## 2014-01-30 LAB — MAGNESIUM: Magnesium: 1.7 mg/dL (ref 1.5–2.5)

## 2014-01-30 MED ORDER — MAGNESIUM SULFATE 40 MG/ML IJ SOLN
2.0000 g | Freq: Once | INTRAMUSCULAR | Status: AC
  Administered 2014-01-30: 2 g via INTRAVENOUS
  Filled 2014-01-30: qty 50

## 2014-01-30 MED ORDER — INSULIN ASPART 100 UNIT/ML ~~LOC~~ SOLN
0.0000 [IU] | Freq: Three times a day (TID) | SUBCUTANEOUS | Status: DC
  Administered 2014-01-30: 1 [IU] via SUBCUTANEOUS
  Administered 2014-01-30: 3 [IU] via SUBCUTANEOUS
  Administered 2014-01-31: 1 [IU] via SUBCUTANEOUS

## 2014-01-30 MED ORDER — POTASSIUM CHLORIDE CRYS ER 20 MEQ PO TBCR
20.0000 meq | EXTENDED_RELEASE_TABLET | Freq: Three times a day (TID) | ORAL | Status: DC
  Administered 2014-01-30 – 2014-01-31 (×3): 20 meq via ORAL
  Filled 2014-01-30 (×5): qty 1

## 2014-01-30 MED ORDER — DILTIAZEM HCL ER COATED BEADS 240 MG PO CP24
240.0000 mg | ORAL_CAPSULE | Freq: Every day | ORAL | Status: DC
  Administered 2014-01-31: 240 mg via ORAL
  Filled 2014-01-30: qty 1

## 2014-01-30 MED ORDER — ACETAMINOPHEN 325 MG PO TABS
650.0000 mg | ORAL_TABLET | Freq: Four times a day (QID) | ORAL | Status: DC | PRN
  Administered 2014-01-31: 650 mg via ORAL
  Filled 2014-01-30: qty 2

## 2014-01-30 MED ORDER — FUROSEMIDE 40 MG PO TABS
40.0000 mg | ORAL_TABLET | Freq: Every day | ORAL | Status: DC
  Administered 2014-01-30 – 2014-01-31 (×2): 40 mg via ORAL
  Filled 2014-01-30 (×2): qty 1

## 2014-01-30 NOTE — Progress Notes (Signed)
[facility] TEAM 1 - Stepdown/ICU TEAM Progress Note  CHARLEENE CALLEGARI ZCH:[id number] DOB: 1931-06-30 DOA: 01/23/2014 PCP: Hollace Kinnier, DO  Admit HPI / Brief Narrative: 80 y.o. female with PMH of chronic systolic CHF (EF of 28%), DM, CKD, Chronic afib on apixaban, h/o cholecystectomy (10/13) and surgery for Colon CA in '99 who presented to the ER with the abdom pain assoc w/ N/V. She'd had intermittent abdominal pain on and off for about 1 month, but worsened the day prior to her admit, and became accompanied by nausea and vomiting. Due to persistence of symptoms she presented to the ER.  In ER, she was noted to have abd distension, tenderness and CT with PSBO with transition point.  HPI/Subjective: Pt continues to pass gas, but has not yet had a BM.  No new complaints.  Denies any significant abdom pain.  No cp or sob.    Assessment/Plan:  Small bowel obstruction likely secondary to adhesions - passing some flatus but no BM yet - ongoing care per Gen Surgery - diet being slowly advanced and tolerated thus far   Chronic Afib / Aflutter w/ acute RVR transitioned back to oral meds, to include anticoag, w/ rate controlled at this time - stable for transfer to tele bed   Hypokalemia replete w/ goal of 4.0 or > to maximize smooth muscle contractility - not yet at goal - increase replacement and follow   Hypomagnesemia  replete to goal of 2.0 - dose today and f/u in AM    Chronic Systolic CHF - recent EF 45- 50% clinically compensated, but +8L though weight only slightly elevated (78.9 kg at admit > 79.6kg today) - stopped IVF - slowly begin to resume lasix - cont to follow Is/Os and weights  Type II diabetes mellitus  CBG's stable - continue SSI   Uncontrolled HTN BP not yet at goal - adjust meds further and follow   Known thoracic aortic aneurysm   Hx colon CA s/p resection '99  Hypothyroidism  Transitioned back to oral med  CKD 3  stable  Code Status: FULL Family  Communication:  Disposition Plan: stable for transfer to tele bed (watch for RVR) - diet per Gen Surg - follow lytes closley - follow volume status   Consultants: Gen Surgery   Procedures: none  Antibiotics: none  DVT prophylaxis: Eliquis   Objective: Blood pressure 165/95, pulse 86, temperature 98.2 F (36.8 C), temperature source Oral, resp. rate 24, height 5\' 10"  (1.778 m), weight 79.6 kg (175 lb 7.8 oz), SpO2 96.00%.  Intake/Output Summary (Last 24 hours) at 01/30/14 1512 Last data filed at 01/30/14 0912  Gross per 24 hour  Intake    482 ml  Output    200 ml  Net    282 ml   Exam: General: No acute respiratory distress Lungs: Clear to auscultation bilaterally without wheezes or crackles Cardiovascular: irreg irreg - rate ~82 - no appreciable M or rub  Abdomen: Nontender, distended, soft, bowel sounds more active, no rebound, no ascites, no appreciable mass Extremities: No significant cyanosis, clubbing, or edema bilateral lower extremities  Data Reviewed: Basic Metabolic Panel:  Recent Labs Lab 01/26/14 0435 01/27/14 0248 01/28/14 0307 01/29/14 0414 01/30/14 0235  NA 143 142 141 142 142  K 3.1* 3.2* 3.7 3.4* 3.5*  CL 102 104 103 102 102  CO2 24 24 25 24 25   GLUCOSE 121* 113* 127* 121* 117*  BUN 9 9 7 6 6   CREATININE 0.83 0.86 0.80 0.85 0.81  CALCIUM 8.9 8.6 9.0 8.9 9.1  MG 1.4* 2.0 1.7 1.5 1.7   Liver Function Tests:  Recent Labs Lab 01/24/14 0445 01/26/14 0435  AST 19 15  ALT 12 8  ALKPHOS 91 68  BILITOT 0.8 0.6  PROT 7.5 6.8  ALBUMIN 3.6 3.0*   CBC:  Recent Labs Lab 01/25/14 0250 01/26/14 0435 01/27/14 0248 01/28/14 0307 01/29/14 0414  WBC 7.2 10.3 9.7 10.9* 8.2  HGB 12.3 12.4 11.7* 12.7 12.3  HCT 35.3* 35.9* 33.8* 35.4* 35.2*  MCV 83.5 84.1 83.7 83.1 82.8  PLT 252 237 208 263 257    CBG:  Recent Labs Lab 01/29/14 0804 01/29/14 1116 01/29/14 1527 01/29/14 2002 01/29/14 2328  GLUCAP 127* 167* 108* 179* 117*     Studies:  Recent x-ray studies have been reviewed in detail by the Attending Physician  Scheduled Meds:  Scheduled Meds: . antiseptic oral rinse  15 mL Mouth Rinse q12n4p  . apixaban  5 mg Oral BID  . bisacodyl  10 mg Rectal Daily  . chlorhexidine  15 mL Mouth Rinse BID  . diltiazem  180 mg Oral Daily  . insulin aspart  0-9 Units Subcutaneous TID WC  . levothyroxine  50 mcg Oral QAC breakfast  . metoprolol tartrate  25 mg Oral BID  . oxymetazoline  1 spray Each Nare BID  . polyethylene glycol  17 g Oral Daily    Time spent on care of this patient: 35 mins   MCCLUNG,JEFFREY T , MD   [facility] Office  [phone] Pager - Text Page per Shea Evans as per below:  On-Call/Text Page:      [email or web address]      password [email or web address]  If 7PM-7AM, please contact night-coverage [email or web address] Password [email or web address] 01/30/2014, 3:12 PM   LOS: 7 days

## 2014-01-30 NOTE — Progress Notes (Signed)
  Subjective: Denies abdominal pain or nausea Passing flatus but no BM yet  Objective: Vital signs in last 24 hours: Temp:  [97.8 F (36.6 C)-98.3 F (36.8 C)] 98.3 F (36.8 C) (07/11 0337) Pulse Rate:  [69-93] 91 (07/11 0337) Resp:  [16-24] 23 (07/11 0337) BP: (157-193)/(84-101) 181/97 mmHg (07/11 0337) SpO2:  [91 %-95 %] 92 % (07/11 0337) Weight:  [175 lb 7.8 oz (79.6 kg)] 175 lb 7.8 oz (79.6 kg) (07/11 0337) Last BM Date: 01/21/14  Intake/Output from previous day: 07/10 0701 - 07/11 0700 In: 1060 [I.V.:460; IV Piggyback:600] Out: -  Intake/Output this shift:    Comfortable in appearance Abdomen non tender, slightly full  Lab Results:   Recent Labs  01/28/14 0307 01/29/14 0414  WBC 10.9* 8.2  HGB 12.7 12.3  HCT 35.4* 35.2*  PLT 263 257   BMET  Recent Labs  01/29/14 0414 01/30/14 0235  NA 142 142  K 3.4* 3.5*  CL 102 102  CO2 24 25  GLUCOSE 121* 117*  BUN 6 6  CREATININE 0.85 0.81  CALCIUM 8.9 9.1   PT/INR No results found for this basename: LABPROT, INR,  in the last 72 hours ABG No results found for this basename: PHART, PCO2, PO2, HCO3,  in the last 72 hours  Studies/Results: Dg Abd Portable 1v  01/28/2014   CLINICAL DATA:  Recent bowel obstruction  EXAM: PORTABLE ABDOMEN - 1 VIEW  COMPARISON:  January 25, 2014  FINDINGS: Nasogastric tube is been removed. There is contrast in the colon. Currently there is no appreciable bowel dilatation. No air-fluid levels are seen. No free air is seen on this supine examination. There are a few small phleboliths in the pelvis.  IMPRESSION: Bowel gas pattern is felt to be unremarkable currently. There is contrast in the colon. No obstruction or free air is seen on this supine examination.   Electronically Signed   By: Lowella Grip M.D.   On: 01/28/2014 13:01    Anti-infectives: Anti-infectives   None      Assessment/Plan:  Resolving partial SBO  Continuing current diet and Miralax.  [place] for transfer to  floor from surgery standpoint  LOS: 7 days    Dorsey Charette A 01/30/2014

## 2014-01-31 LAB — MAGNESIUM: MAGNESIUM: 1.9 mg/dL (ref 1.5–2.5)

## 2014-01-31 LAB — BASIC METABOLIC PANEL
Anion gap: 17 — ABNORMAL HIGH (ref 5–15)
BUN: 8 mg/dL (ref 6–23)
CALCIUM: 9.5 mg/dL (ref 8.4–10.5)
CO2: 25 mEq/L (ref 19–32)
CREATININE: 0.92 mg/dL (ref 0.50–1.10)
Chloride: 99 mEq/L (ref 96–112)
GFR calc Af Amer: 66 mL/min — ABNORMAL LOW (ref >90)
GFR calc non Af Amer: 57 mL/min — ABNORMAL LOW (ref >90)
Glucose, Bld: 123 mg/dL — ABNORMAL HIGH (ref 70–99)
Potassium: 3.8 mEq/L (ref 3.7–5.3)
Sodium: 141 mEq/L (ref 137–147)

## 2014-01-31 LAB — GLUCOSE, CAPILLARY
GLUCOSE-CAPILLARY: 121 mg/dL — AB (ref 70–99)
Glucose-Capillary: 116 mg/dL — ABNORMAL HIGH (ref 70–99)

## 2014-01-31 NOTE — Progress Notes (Signed)
  Subjective: No nausea, passing gas and had BM  Objective: Vital signs in last 24 hours: Temp:  [97.6 F (36.4 C)-98.2 F (36.8 C)] 97.6 F (36.4 C) (07/12 0514) Pulse Rate:  [76-113] 84 (07/12 0514) Resp:  [17-24] 20 (07/12 0514) BP: (144-179)/(78-96) 153/96 mmHg (07/12 0514) SpO2:  [94 %-97 %] 97 % (07/12 0514) Weight:  [174 lb 2.6 oz (79 kg)] 174 lb 2.6 oz (79 kg) (07/12 0514) Last BM Date: 01/31/14  Intake/Output from previous day: 07/11 0701 - 07/12 0700 In: 32 [I.V.:32] Out: 1850 [Urine:1850] Intake/Output this shift:    General appearance: alert and cooperative Resp: clear to auscultation bilaterally Cardio: S1, S2 normal GI: soft, NT, active BS  Lab Results:   Recent Labs  01/29/14 0414  WBC 8.2  HGB 12.3  HCT 35.2*  PLT 257   BMET  Recent Labs  01/30/14 0235 01/31/14 0530  NA 142 141  K 3.5* 3.8  CL 102 99  CO2 25 25  GLUCOSE 117* 123*  BUN 6 8  CREATININE 0.81 0.92  CALCIUM 9.1 9.5   PT/INR No results found for this basename: LABPROT, INR,  in the last 72 hours ABG No results found for this basename: PHART, PCO2, PO2, HCO3,  in the last 72 hours  Studies/Results: No results found.  Anti-infectives: Anti-infectives   None      Assessment/Plan: Partial SBO - resolved, advance diet to reg and OK to D/C if she tolerates. I D/W Dr. Sloan Leiter  LOS: 8 days    Haroon Shatto E 01/31/2014

## 2014-01-31 NOTE — Discharge Summary (Signed)
PATIENT DETAILS Name: Nicole [name] Age: 80 y.o. Sex: female Date of Birth: Sep 21, 1930 MRN: [id number]. Admit Date: 01/23/2014 Admitting Physician: Domenic Polite, MD MOQ:HUTM, [facility], DO  Recommendations for Outpatient Follow-up:  1. General Health Maintenance   PRIMARY DISCHARGE DIAGNOSIS:  Principal Problem:   SBO (small bowel obstruction) Active Problems:   History of colon cancer, stage III   CHF, past NICM with an EF of 30%, most recent echo 2011 showed EF to be45- 50%   CAF not felt to be a Coumadin candidate in the past, rate currently controlled   Type II or unspecified type diabetes mellitus with neurological manifestations, not stated as uncontrolled   Diabetic nephropathy      PAST MEDICAL HISTORY: Past Medical History  Diagnosis Date  . Colon cancer 07/1997  . Hypertension   . Diabetes mellitus   . CHF (congestive heart failure)   . AAA (abdominal aortic aneurysm)   . Atrial fibrillation   . Hypercholesteremia   . Kidney stone     renal calculi  . Small bowel obstruction due to adhesions 05/16/2012  . Hypothyroidism   . Arthritis   . Anemia   . Cholelithiasis   . Perforation of bile duct   . Hemorrhage of rectum and anus   . Swelling, mass, or lump in head and neck   . Cervicalgia   . Hypoglycemia, unspecified   . Pain in joint, lower leg   . Anxiety   . Shortness of breath   . Acute upper respiratory infections of unspecified site   . Depression   . Thoracic aneurysm without mention of rupture   . GERD (gastroesophageal reflux disease)   . Proteinuria   . Other specified cardiac dysrhythmias(427.89)   . Congestive heart failure, unspecified   . Electrolyte and fluid disorders not elsewhere classified   . Other chronic allergic conjunctivitis   . Hypopotassemia   . Chronic systolic heart failure   . Dizziness and giddiness   . Malignant neoplasm of colon, unspecified site   . Other and unspecified hyperlipidemia   . Gout, unspecified     . Atrial fibrillation   . Aortic aneurysm of unspecified site without mention of rupture   . Pain in joint, site unspecified   . Palpitations   . CKD (chronic kidney disease)     DISCHARGE MEDICATIONS:   Medication List         allopurinol 100 MG tablet  Commonly known as:  ZYLOPRIM  Take 1 tablet by mouth twice a day for CHRONIC GOUT     apixaban 5 MG Tabs tablet  Commonly known as:  ELIQUIS  Take 1 tablet by mouth every 12 hours.     diltiazem 180 MG 24 hr capsule  Commonly known as:  CARTIA XT  Take 1 capsule (180 mg total) by mouth daily.     furosemide 40 MG tablet  Commonly known as:  LASIX  take 1 tablet by mouth twice a day     levothyroxine 50 MCG tablet  Commonly known as:  SYNTHROID, LEVOTHROID  Take 1 tablet (50 mcg total) by mouth daily.     magnesium oxide 400 MG tablet  Commonly known as:  MAG-OX  Take one tablet by mouth three times daily for magnesium supplement     metFORMIN 500 MG tablet  Commonly known as:  GLUCOPHAGE  Take 1 tablet (500 mg total) by mouth 2 (two) times daily with a meal.     polyethylene glycol packet  Commonly known as:  MIRALAX / GLYCOLAX  Take 17 g by mouth daily as needed for mild constipation.     potassium chloride SA 20 MEQ tablet  Commonly known as:  KLOR-CON M20  Take one tablet by mouth once daily     simvastatin 40 MG tablet  Commonly known as:  ZOCOR  Take one tablet by mouth once daily        ALLERGIES:   Allergies  Allergen Reactions  . Iohexol      Code: VOM, Desc: PT REPORTS VOMITING W/ IVP DYE- ARS 12/26/08, Onset Date: [id number]   . Z-Pak [Azithromycin]   . Iodinated Diagnostic Agents Nausea Only    PT HAS HAD SINGULAR INCIDENT OF NAUSEA  W/ IV CONTYRAST INJECTION, NONE IF INJECTED SLOWLY//A.CALHOUN    BRIEF HPI:  See H&P, Labs, Consult and Test reports for all details in brief, 80 y.o. female with PMH of chronic systolic CHF (EF of 27%), DM, CKD, Chronic afib on apixaban, h/o cholecystectomy  (10/13) and surgery for Colon CA in '99 who presented to the ER with the abdom pain, was found to have SBO  CONSULTATIONS:   general surgery  PERTINENT RADIOLOGIC STUDIES: Ct Abdomen Pelvis Wo Contrast  01/23/2014   CLINICAL DATA:  Epigastric pain and nausea. History of bowel resection for colon cancer.  EXAM: CT ABDOMEN AND PELVIS WITHOUT CONTRAST  TECHNIQUE: Multidetector CT imaging of the abdomen and pelvis was performed following the standard protocol without IV contrast.  COMPARISON:  CT abdomen and pelvis with contrast 02/09/2013.  FINDINGS: Mild dependent atelectasis is present at the lung bases bilaterally. No focal nodule, mass, or airspace disease is present. The heart is mildly enlarged without significant change. Atherosclerotic calcifications are present in the aorta without aneurysm.  Minimal fluid is present around the liver. The liver is otherwise unremarkable. Patient is status post splenectomy. Multiple soft tissue nodules are stable, reflecting residual splenules. Contrast is present within the stomach. The duodenum is unremarkable. The pancreas is within normal limits. There is significant interval enlargement of the common bile duct, now measuring 2.4 cm. No obstructing mass is evident. The adrenal glands are normal bilaterally. Renal atrophy is stable. The ureters are within normal limits.  The rectosigmoid colon is within normal limits. The left lower quadrant anastomosis is intact. The more proximal colon is within normal limits as well. The appendix is visualized and normal.  Dilated loops of small bowel are present in the mid abdomen with gradual dilution of contrast. The transition point is in the low anterior pelvis appears to be associated with adhesions. No obstructing mass lesion is present. There is no free fluid in the pelvis. The patient is status post hysterectomy. The ovaries are visualized and appear to be within normal limits.  The bone windows demonstrate partial fusion  along the SI joints bilaterally. Mild degenerative changes are noted in the lumbar spine. No focal lytic or blastic lesions are evident.  IMPRESSION: 1. Partial small bowel obstruction with transition point in the mid lower abdomen likely related to adhesions. 2. Stable appearance of the chronic CT anastomosis. 3. Atherosclerosis. 4. Significant interval increase and the size the common bile duct without an obstructing lesion. 5. Mild cardiomegaly without failure.   Electronically Signed   By: Lawrence Santiago M.D.   On: 01/23/2014 12:10   Dg Abd 1 View  01/24/2014   CLINICAL DATA:  Follow-up small bowel obstruction.  EXAM: ABDOMEN - 1 VIEW  COMPARISON:  01/23/2014  FINDINGS: Supine  images of the abdomen were obtained. Nasogastric has been placed and the tip is in the gastric body region. There continues to be dilated loops of small bowel which contain air and oral contrast. There is stool in the colon but no significant contrast in the colon. There may be slightly increased distention of small bowel loops compared to the recent CT examination. Limited evaluation for free air on the supine images.  IMPRESSION: Persistent dilated loops of small bowel and there may be slightly increased distention of the small bowel loops. Findings remain compatible with a small bowel obstruction.  Nasogastric tube in the stomach body region.   Electronically Signed   By: Markus Daft M.D.   On: 01/24/2014 10:50   Dg Abd Portable 1v  01/28/2014   CLINICAL DATA:  Recent bowel obstruction  EXAM: PORTABLE ABDOMEN - 1 VIEW  COMPARISON:  January 25, 2014  FINDINGS: Nasogastric tube is been removed. There is contrast in the colon. Currently there is no appreciable bowel dilatation. No air-fluid levels are seen. No free air is seen on this supine examination. There are a few small phleboliths in the pelvis.  IMPRESSION: Bowel gas pattern is felt to be unremarkable currently. There is contrast in the colon. No obstruction or free air is seen on  this supine examination.   Electronically Signed   By: Lowella Grip M.D.   On: 01/28/2014 13:01   Dg Abd Portable 2v  01/25/2014   CLINICAL DATA:  Abdominal pain.  EXAM: PORTABLE ABDOMEN - 2 VIEW  COMPARISON:  01/24/2014  FINDINGS: Decrease in prominence of small bowel loops without evidence of progressive small bowel obstruction. No free air is identified. Nasogastric tube continues to extend into the stomach.  IMPRESSION: Decrease in an dilatation of small bowel loops.  No free air.   Electronically Signed   By: Aletta Edouard M.D.   On: 01/25/2014 08:07     PERTINENT LAB RESULTS: CBC:  Recent Labs  01/29/14 0414  WBC 8.2  HGB 12.3  HCT 35.2*  PLT 257   CMET CMP     Component Value Date/Time   NA 141 01/31/2014 0530   NA 142 12/03/2013 0833   K 3.8 01/31/2014 0530   CL 99 01/31/2014 0530   CO2 25 01/31/2014 0530   GLUCOSE 123* 01/31/2014 0530   GLUCOSE 86 12/03/2013 0833   BUN 8 01/31/2014 0530   BUN 19 12/03/2013 0833   CREATININE 0.92 01/31/2014 0530   CALCIUM 9.5 01/31/2014 0530   PROT 6.8 01/26/2014 0435   PROT 7.0 12/03/2013 0833   ALBUMIN 3.0* 01/26/2014 0435   AST 15 01/26/2014 0435   ALT 8 01/26/2014 0435   ALKPHOS 68 01/26/2014 0435   BILITOT 0.6 01/26/2014 0435   GFRNONAA 57* 01/31/2014 0530   GFRAA 66* 01/31/2014 0530    GFR Estimated Creatinine Clearance: 52.7 ml/min (by C-G formula based on Cr of 0.92). No results found for this basename: LIPASE, AMYLASE,  in the last 72 hours No results found for this basename: CKTOTAL, CKMB, CKMBINDEX, TROPONINI,  in the last 72 hours No components found with this basename: POCBNP,  No results found for this basename: DDIMER,  in the last 72 hours No results found for this basename: HGBA1C,  in the last 72 hours No results found for this basename: CHOL, HDL, LDLCALC, TRIG, CHOLHDL, LDLDIRECT,  in the last 72 hours No results found for this basename: TSH, T4TOTAL, FREET3, T3FREE, THYROIDAB,  in the last 72 hours No  results found for  this basename: VITAMINB12, FOLATE, FERRITIN, TIBC, IRON, RETICCTPCT,  in the last 72 hours Coags: No results found for this basename: PT, INR,  in the last 72 hours Microbiology: No results found for this or any previous visit (from the past 240 hour(s)).   BRIEF HOSPITAL COURSE:   Principal Problem: Small bowel obstruction  -likely secondary to adhesions  -admitted, treat with supportive care, NG placed, Gen surgery consulted. Thankfully, SBO seems to have resolved with supportive care, diet advance today, which she has tolerated, as a result she will be discharged  Chronic Afib / Aflutter w/ acute RVR  -Hospital course complicated by Afib RVR-oral cardizem on hold on admission as NPO - started on IV Lopressor + cardizem gtt, since diet now advanced, patient resumed on oral cardizem and Eliquis   Hypokalemia  -repleted, resolved. K normal at 3.8   Hypomagnesemia  repleted   Chronic Systolic CHF - recent EF 45- 50%  -clinically compensaated  -resume Lasix   Type II diabetes mellitus  -CBG's stable - with SSI while inpatient-resume Metformin on discharge   Uncontrolled HTN  -controlled with Metoprolol, Cardizem and Lasix   Known thoracic aortic aneurysm   Hx colon CA s/p resection '99   Hypothyroidism  -c/w Levothyroxine   CKD 3  -stable  TODAY-DAY OF DISCHARGE:  Subjective:   Marjorie Deprey today has no headache,no chest abdominal pain,no new weakness tingling or numbness, feels much better wants to go home today.   Objective:   Blood pressure 153/96, pulse 84, temperature 97.6 F (36.4 C), temperature source Oral, resp. rate 20, height 5\' 10"  (1.778 m), weight 79 kg (174 lb 2.6 oz), SpO2 97.00%.  Intake/Output Summary (Last 24 hours) at 01/31/14 1449 Last data filed at 01/31/14 0700  Gross per 24 hour  Intake      0 ml  Output   1650 ml  Net  -1650 ml   Filed Weights   01/29/14 0419 01/30/14 0337 01/31/14 0514  Weight: 80.8 kg (178 lb 2.1 oz) 79.6 kg  (175 lb 7.8 oz) 79 kg (174 lb 2.6 oz)    Exam Awake Alert, Oriented *3, No new F.N deficits, Normal affect [place].AT,PERRAL Supple Neck,No JVD, No cervical lymphadenopathy appriciated.  Symmetrical Chest wall movement, Good air movement bilaterally, CTAB RRR,No Gallops,Rubs or new Murmurs, No Parasternal Heave +ve B.Sounds, Abd Soft, Non tender, No organomegaly appriciated, No rebound -guarding or rigidity. No Cyanosis, Clubbing or edema, No new Rash or bruise  DISCHARGE CONDITION: Stable  DISPOSITION: Home  DISCHARGE INSTRUCTIONS:    Activity:  As tolerated   Diet recommendation: Diabetic Diet Heart Healthy diet      Discharge Instructions   Call MD for:  persistant nausea and vomiting    Complete by:  As directed      Call MD for:  severe uncontrolled pain    Complete by:  As directed      Diet - low sodium heart healthy    Complete by:  As directed      Diet Carb Modified    Complete by:  As directed      Increase activity slowly    Complete by:  As directed            Follow-up Information   Follow up with [name], [name], DO. Schedule an appointment as soon as possible for a visit in 1 week.   Specialty:  Geriatric Medicine   Contact information:   [address] [employer]  [place] [address] [phone]      Total Time spent on discharge equals 45 minutes.  SignedOren Binet 01/31/2014 2:49 PM  **Disclaimer: This note may have been dictated with voice recognition software. Similar sounding words can inadvertently be transcribed and this note may contain transcription errors which may not have been corrected upon publication of note.**

## 2014-01-31 NOTE — Progress Notes (Signed)
PATIENT DETAILS Name: Nicole [name] Age: 80 y.o. Sex: female Date of Birth: 08-Oct-1930 Admit Date: 01/23/2014 Admitting Physician Domenic Polite, MD IEP:PIRJ, [facility], DO  Brief Summary 80 y.o. female with PMH of chronic systolic CHF (EF of 18%), DM, CKD, Chronic afib on apixaban, h/o cholecystectomy (10/13) and surgery for Colon CA in '99 who presented to the ER with the abdom pain, was found to have SBO. Hospital course complicated by development of Afib RVR-necessitating a Cardizem gtt and transfer to SDU.  Subjective: No major complains. Had BM today  Assessment/Plan: Small bowel obstruction  -likely secondary to adhesions  -admitted, treat with supportive care, NG placed, Gen surgery consulted. Thankfully, SBO seems to have resolved with supportive care, diet advance today, if tolerates, will discharge later today  Chronic Afib / Aflutter w/ acute RVR  -Hospital course complicated by Afib RVR-oral cardizem on hold on admission as NPO - started on IV Lopressor + cardizem gtt, since diet now advanced, patient resumed on oral cardizem and Eliquis  Hypokalemia  -repleted, resolved. K normal at 3.8   Hypomagnesemia  repleted  Chronic Systolic CHF - recent EF 45- 50%  -clinically compensaated  -resume Lasix  Type II diabetes mellitus  -CBG's stable - with SSI while inpatient-resume Metformin on discharge  Uncontrolled HTN  -controlled with Metoprolol, Cardizem and Lasix  Known thoracic aortic aneurysm   Hx colon CA s/p resection '99   Hypothyroidism  -c/w Levothyroxine   CKD 3  -stable  Disposition: Remain inpatient-hom  DVT Prophylaxis: Not needed-On Eliquis  Code Status: Full code   Family Communication None at bedside  Procedures:  None  CONSULTS:  general surgery  MEDICATIONS: Scheduled Meds: . apixaban  5 mg Oral BID  . bisacodyl  10 mg Rectal Daily  . diltiazem  240 mg Oral Daily  . furosemide  40 mg Oral Daily  . insulin aspart   0-9 Units Subcutaneous TID WC  . levothyroxine  50 mcg Oral QAC breakfast  . metoprolol tartrate  25 mg Oral BID  . oxymetazoline  1 spray Each Nare BID  . polyethylene glycol  17 g Oral Daily  . potassium chloride  20 mEq Oral TID   Continuous Infusions:   PRN Meds:.acetaminophen, acetaminophen, hydrALAZINE, hydrocortisone cream, morphine injection, ondansetron (ZOFRAN) IV, phenol  Antibiotics: Anti-infectives   None       PHYSICAL EXAM: Vital signs in last 24 hours: Filed Vitals:   01/30/14 1604 01/30/14 1717 01/30/14 2146 01/31/14 0514  BP:  179/94 146/78 153/96  Pulse:  88 83 84  Temp: 98 F (36.7 C) 97.7 F (36.5 C) 97.9 F (36.6 C) 97.6 F (36.4 C)  TempSrc: Oral Oral Oral Oral  Resp:  20 20 20   Height:      Weight:    79 kg (174 lb 2.6 oz)  SpO2:  95% 95% 97%    Weight change: -0.6 kg (-1 lb 5.2 oz) Filed Weights   01/29/14 0419 01/30/14 0337 01/31/14 0514  Weight: 80.8 kg (178 lb 2.1 oz) 79.6 kg (175 lb 7.8 oz) 79 kg (174 lb 2.6 oz)   Body mass index is 24.99 kg/(m^2).   Gen Exam: Awake and alert with clear speech.   Neck: Supple, No JVD.   Chest: B/L Clear.   CVS: S1 S2 Regular, no murmurs.  Abdomen: soft, BS +, non tender, non distended.  Extremities: no edema, lower extremities warm to touch Neurologic: Non Focal.   Skin:  No Rash.   Wounds: N/A.    Intake/Output from previous day:  Intake/Output Summary (Last 24 hours) at 01/31/14 1212 Last data filed at 01/31/14 0700  Gross per 24 hour  Intake      0 ml  Output   1650 ml  Net  -1650 ml     LAB RESULTS: CBC  Recent Labs Lab 01/25/14 0250 01/26/14 0435 01/27/14 0248 01/28/14 0307 01/29/14 0414  WBC 7.2 10.3 9.7 10.9* 8.2  HGB 12.3 12.4 11.7* 12.7 12.3  HCT 35.3* 35.9* 33.8* 35.4* 35.2*  PLT 252 237 208 263 257  MCV 83.5 84.1 83.7 83.1 82.8  MCH 29.1 29.0 29.0 29.8 28.9  MCHC 34.8 34.5 34.6 35.9 34.9  RDW 15.0 15.1 14.8 14.9 14.9    Chemistries   Recent Labs Lab  01/27/14 0248 01/28/14 0307 01/29/14 0414 01/30/14 0235 01/31/14 0530  NA 142 141 142 142 141  K 3.2* 3.7 3.4* 3.5* 3.8  CL 104 103 102 102 99  CO2 24 25 24 25 25   GLUCOSE 113* 127* 121* 117* 123*  BUN 9 7 6 6 8   CREATININE 0.86 0.80 0.85 0.81 0.92  CALCIUM 8.6 9.0 8.9 9.1 9.5  MG 2.0 1.7 1.5 1.7 1.9    CBG:  Recent Labs Lab 01/30/14 0817 01/30/14 1104 01/30/14 1559 01/30/14 2147 01/31/14 0806  GLUCAP 130* 204* 105* 130* 121*    GFR Estimated Creatinine Clearance: 52.7 ml/min (by C-G formula based on Cr of 0.92).  Coagulation profile No results found for this basename: INR, PROTIME,  in the last 168 hours  Cardiac Enzymes No results found for this basename: CK, CKMB, TROPONINI, MYOGLOBIN,  in the last 168 hours  No components found with this basename: POCBNP,  No results found for this basename: DDIMER,  in the last 72 hours No results found for this basename: HGBA1C,  in the last 72 hours No results found for this basename: CHOL, HDL, LDLCALC, TRIG, CHOLHDL, LDLDIRECT,  in the last 72 hours No results found for this basename: TSH, T4TOTAL, FREET3, T3FREE, THYROIDAB,  in the last 72 hours No results found for this basename: VITAMINB12, FOLATE, FERRITIN, TIBC, IRON, RETICCTPCT,  in the last 72 hours No results found for this basename: LIPASE, AMYLASE,  in the last 72 hours  Urine Studies No results found for this basename: UACOL, UAPR, USPG, UPH, UTP, UGL, UKET, UBIL, UHGB, UNIT, UROB, ULEU, UEPI, UWBC, URBC, UBAC, CAST, CRYS, UCOM, BILUA,  in the last 72 hours  MICROBIOLOGY: No results found for this or any previous visit (from the past 240 hour(s)).  RADIOLOGY STUDIES/RESULTS: Ct Abdomen Pelvis Wo Contrast  01/23/2014   CLINICAL DATA:  Epigastric pain and nausea. History of bowel resection for colon cancer.  EXAM: CT ABDOMEN AND PELVIS WITHOUT CONTRAST  TECHNIQUE: Multidetector CT imaging of the abdomen and pelvis was performed following the standard protocol  without IV contrast.  COMPARISON:  CT abdomen and pelvis with contrast 02/09/2013.  FINDINGS: Mild dependent atelectasis is present at the lung bases bilaterally. No focal nodule, mass, or airspace disease is present. The heart is mildly enlarged without significant change. Atherosclerotic calcifications are present in the aorta without aneurysm.  Minimal fluid is present around the liver. The liver is otherwise unremarkable. Patient is status post splenectomy. Multiple soft tissue nodules are stable, reflecting residual splenules. Contrast is present within the stomach. The duodenum is unremarkable. The pancreas is within normal limits. There is significant interval enlargement of the common bile duct, now  measuring 2.4 cm. No obstructing mass is evident. The adrenal glands are normal bilaterally. Renal atrophy is stable. The ureters are within normal limits.  The rectosigmoid colon is within normal limits. The left lower quadrant anastomosis is intact. The more proximal colon is within normal limits as well. The appendix is visualized and normal.  Dilated loops of small bowel are present in the mid abdomen with gradual dilution of contrast. The transition point is in the low anterior pelvis appears to be associated with adhesions. No obstructing mass lesion is present. There is no free fluid in the pelvis. The patient is status post hysterectomy. The ovaries are visualized and appear to be within normal limits.  The bone windows demonstrate partial fusion along the SI joints bilaterally. Mild degenerative changes are noted in the lumbar spine. No focal lytic or blastic lesions are evident.  IMPRESSION: 1. Partial small bowel obstruction with transition point in the mid lower abdomen likely related to adhesions. 2. Stable appearance of the chronic CT anastomosis. 3. Atherosclerosis. 4. Significant interval increase and the size the common bile duct without an obstructing lesion. 5. Mild cardiomegaly without  failure.   Electronically Signed   By: Lawrence Santiago M.D.   On: 01/23/2014 12:10   Dg Abd 1 View  01/24/2014   CLINICAL DATA:  Follow-up small bowel obstruction.  EXAM: ABDOMEN - 1 VIEW  COMPARISON:  01/23/2014  FINDINGS: Supine images of the abdomen were obtained. Nasogastric has been placed and the tip is in the gastric body region. There continues to be dilated loops of small bowel which contain air and oral contrast. There is stool in the colon but no significant contrast in the colon. There may be slightly increased distention of small bowel loops compared to the recent CT examination. Limited evaluation for free air on the supine images.  IMPRESSION: Persistent dilated loops of small bowel and there may be slightly increased distention of the small bowel loops. Findings remain compatible with a small bowel obstruction.  Nasogastric tube in the stomach body region.   Electronically Signed   By: Markus Daft M.D.   On: 01/24/2014 10:50   Dg Abd Portable 1v  01/28/2014   CLINICAL DATA:  Recent bowel obstruction  EXAM: PORTABLE ABDOMEN - 1 VIEW  COMPARISON:  January 25, 2014  FINDINGS: Nasogastric tube is been removed. There is contrast in the colon. Currently there is no appreciable bowel dilatation. No air-fluid levels are seen. No free air is seen on this supine examination. There are a few small phleboliths in the pelvis.  IMPRESSION: Bowel gas pattern is felt to be unremarkable currently. There is contrast in the colon. No obstruction or free air is seen on this supine examination.   Electronically Signed   By: Lowella Grip M.D.   On: 01/28/2014 13:01   Dg Abd Portable 2v  01/25/2014   CLINICAL DATA:  Abdominal pain.  EXAM: PORTABLE ABDOMEN - 2 VIEW  COMPARISON:  01/24/2014  FINDINGS: Decrease in prominence of small bowel loops without evidence of progressive small bowel obstruction. No free air is identified. Nasogastric tube continues to extend into the stomach.  IMPRESSION: Decrease in an dilatation  of small bowel loops.  No free air.   Electronically Signed   By: Aletta Edouard M.D.   On: 01/25/2014 08:07    Oren Binet, MD  [facility] Pager:[phone]  If 7PM-7AM, please contact night-coverage [email or web address] Password [email or web address] 01/31/2014, 12:12 PM   LOS: 8 days   **Disclaimer: This  note may have been dictated with voice recognition software. Similar sounding words can inadvertently be transcribed and this note may contain transcription errors which may not have been corrected upon publication of note.**

## 2014-02-01 LAB — GLUCOSE, CAPILLARY: Glucose-Capillary: 123 mg/dL — ABNORMAL HIGH (ref 70–99)

## 2014-02-08 ENCOUNTER — Other Ambulatory Visit: Payer: Self-pay | Admitting: *Deleted

## 2014-02-08 ENCOUNTER — Ambulatory Visit (INDEPENDENT_AMBULATORY_CARE_PROVIDER_SITE_OTHER): Payer: Medicare Other | Admitting: Internal Medicine

## 2014-02-08 ENCOUNTER — Encounter: Payer: Self-pay | Admitting: Internal Medicine

## 2014-02-08 VITALS — BP 128/80 | HR 100 | Temp 99.5°F | Resp 12 | Wt 173.0 lb

## 2014-02-08 DIAGNOSIS — J029 Acute pharyngitis, unspecified: Secondary | ICD-10-CM

## 2014-02-08 DIAGNOSIS — K565 Intestinal adhesions [bands], unspecified as to partial versus complete obstruction: Secondary | ICD-10-CM

## 2014-02-08 DIAGNOSIS — K5909 Other constipation: Secondary | ICD-10-CM

## 2014-02-08 DIAGNOSIS — E1149 Type 2 diabetes mellitus with other diabetic neurological complication: Secondary | ICD-10-CM

## 2014-02-08 DIAGNOSIS — I4891 Unspecified atrial fibrillation: Secondary | ICD-10-CM

## 2014-02-08 DIAGNOSIS — E1121 Type 2 diabetes mellitus with diabetic nephropathy: Secondary | ICD-10-CM

## 2014-02-08 DIAGNOSIS — E034 Atrophy of thyroid (acquired): Secondary | ICD-10-CM

## 2014-02-08 DIAGNOSIS — E1129 Type 2 diabetes mellitus with other diabetic kidney complication: Secondary | ICD-10-CM

## 2014-02-08 DIAGNOSIS — E0789 Other specified disorders of thyroid: Secondary | ICD-10-CM

## 2014-02-08 DIAGNOSIS — N058 Unspecified nephritic syndrome with other morphologic changes: Secondary | ICD-10-CM

## 2014-02-08 DIAGNOSIS — E038 Other specified hypothyroidism: Secondary | ICD-10-CM

## 2014-02-08 DIAGNOSIS — K5900 Constipation, unspecified: Secondary | ICD-10-CM

## 2014-02-08 MED ORDER — POTASSIUM CHLORIDE CRYS ER 20 MEQ PO TBCR: EXTENDED_RELEASE_TABLET | ORAL | Status: DC

## 2014-02-08 NOTE — Telephone Encounter (Signed)
[facility] 

## 2014-02-08 NOTE — Progress Notes (Signed)
Patient ID: Nicole [name], female   DOB: [birth year], 80 y.o.   MRN: [id number]   Location:  [facility] / [place] [facility]  Code Status: DNR, says she has a completed living will and hcpoa, but needs to bring a copy  Allergies  Allergen Reactions  . Iohexol      Code: VOM, Desc: PT REPORTS VOMITING W/ IVP DYE- ARS 12/26/08, Onset Date: [id number]   . Z-Pak [Azithromycin]   . Iodinated Diagnostic Agents Nausea Only    PT HAS HAD SINGULAR INCIDENT OF NAUSEA  W/ IV CONTYRAST INJECTION, NONE IF INJECTED SLOWLY//A.[facility]    Chief Complaint  Patient presents with  . Hospitalization Follow-up    Seen in ER/Hospital on 01/23/14 for GI Issues. Patient states issues resolved since hospital discharge   . URI    Sore throat and low-grade fever     HPI: Patient is a 80 y.o. black female seen in the office today for hospital f/u after hospital stay for small bowel obstruction secondary to adhesions.  Had NGT placed and eventually, diet advanced and did better.  Was there 9 days.  Abdomen better now.  Has not even needed miralax, she says.  Has had sore throat since she had the NGT.   Is hoarse.   Today has low grade temp of 99, but no other symptoms other than the persistent throat irritation.    Having trouble getting back to eating solid food, but has not lost weight.    Using lozenges and choraseptic for it with some help.  Review of Systems:  Review of Systems  Constitutional: Negative for fever, chills and malaise/fatigue.  HENT: Positive for sore throat.   Eyes: Negative for blurred vision.  Respiratory: Negative for cough and shortness of breath.   Cardiovascular: Negative for chest pain and leg swelling.  Gastrointestinal: Negative for nausea, vomiting, abdominal pain, constipation, blood in stool and melena.  Genitourinary: Negative for dysuria.  Musculoskeletal: Negative for myalgias and falls.  Neurological: Negative for dizziness and weakness.    Endo/Heme/Allergies: Does not bruise/bleed easily.  Psychiatric/Behavioral: Negative for depression and memory loss.    Past Medical History  Diagnosis Date  . Colon cancer 07/1997  . Hypertension   . Diabetes mellitus   . CHF (congestive heart failure)   . AAA (abdominal aortic aneurysm)   . Atrial fibrillation   . Hypercholesteremia   . Kidney stone     renal calculi  . Small bowel obstruction due to adhesions 05/16/2012  . Hypothyroidism   . Arthritis   . Anemia   . Cholelithiasis   . Perforation of bile duct   . Hemorrhage of rectum and anus   . Swelling, mass, or lump in head and neck   . Cervicalgia   . Hypoglycemia, unspecified   . Pain in joint, lower leg   . Anxiety   . Shortness of breath   . Acute upper respiratory infections of unspecified site   . Depression   . Thoracic aneurysm without mention of rupture   . GERD (gastroesophageal reflux disease)   . Proteinuria   . Other specified cardiac dysrhythmias(427.89)   . Congestive heart failure, unspecified   . Electrolyte and fluid disorders not elsewhere classified   . Other chronic allergic conjunctivitis   . Hypopotassemia   . Chronic systolic heart failure   . Dizziness and giddiness   . Malignant neoplasm of colon, unspecified site   . Other and unspecified hyperlipidemia   . Gout,  unspecified   . Atrial fibrillation   . Aortic aneurysm of unspecified site without mention of rupture   . Pain in joint, site unspecified   . Palpitations   . CKD (chronic kidney disease)     Past Surgical History  Procedure Laterality Date  . Hemicolectomy  08/11/1997  . Kidney stone surgery    . Colon surgery      to remove colon ca  . Cholecystectomy  05/14/2012    Procedure: LAPAROSCOPIC CHOLECYSTECTOMY WITH INTRAOPERATIVE CHOLANGIOGRAM;  Surgeon: Haywood Lasso, MD;  Location: [place];  Service: General;  Laterality: N/A;  . Ercp  05/17/2012    Procedure: ENDOSCOPIC RETROGRADE CHOLANGIOPANCREATOGRAPHY  (ERCP);  Surgeon: Missy Sabins, MD;  Location: [place];  Service: Gastroenterology;  Laterality: N/A;  . Ercp  08/12/2012    Procedure: ENDOSCOPIC RETROGRADE CHOLANGIOPANCREATOGRAPHY (ERCP);  Surgeon: Missy Sabins, MD;  Location: [facility] ENDOSCOPY;  Service: Endoscopy;  Laterality: N/A;  . Transthoracic echocardiogram  11/2009    EF 45-50%, mild-mod AV regurg; mild MR; mod TR; ascending aorta mildly dilated  . Cardiac catheterization  2008    moderate severe pulm HTN; with elevted pulm capillary wedge pressure     Social History:   reports that she has never smoked. She has never used smokeless tobacco. She reports that she does not drink alcohol or use illicit drugs.  Family History  Problem Relation Age of Onset  . Heart disease Mother   . Stroke Father   . Hypertension Daughter   . Hypertension Son   . Diabetes Sister   . Hypertension Son     Medications: Patient's Medications  New Prescriptions   No medications on file  Previous Medications   ALLOPURINOL (ZYLOPRIM) 100 MG TABLET    Take 1 tablet by mouth twice a day for CHRONIC GOUT   APIXABAN (ELIQUIS) 5 MG TABS TABLET    Take 1 tablet by mouth every 12 hours.   DILTIAZEM (CARTIA XT) 180 MG 24 HR CAPSULE    Take 1 capsule (180 mg total) by mouth daily.   FUROSEMIDE (LASIX) 40 MG TABLET    take 1 tablet by mouth twice a day   LEVOTHYROXINE (SYNTHROID, LEVOTHROID) 50 MCG TABLET    Take 1 tablet (50 mcg total) by mouth daily.   MAGNESIUM OXIDE (MAG-OX) 400 MG TABLET    Take one tablet by mouth three times daily for magnesium supplement   METFORMIN (GLUCOPHAGE) 500 MG TABLET    Take 1 tablet (500 mg total) by mouth 2 (two) times daily with a meal.   POLYETHYLENE GLYCOL (MIRALAX / GLYCOLAX) PACKET    Take 17 g by mouth daily as needed for mild constipation.    POTASSIUM CHLORIDE SA (KLOR-CON M20) 20 MEQ TABLET    Take one tablet by mouth once daily   SIMVASTATIN (ZOCOR) 40 MG TABLET    Take one tablet by mouth once daily  Modified  Medications   No medications on file  Discontinued Medications   No medications on file     Physical Exam: Filed Vitals:   02/08/14 1452  BP: 128/80  Pulse: 100  Temp: 99.5 F (37.5 C)  TempSrc: Oral  Resp: 12  Weight: 173 lb (78.472 kg)  SpO2: 94%  Physical Exam  Constitutional: She is oriented to person, place, and time. She appears well-developed and well-nourished. No distress.  Cardiovascular: Normal rate, regular rhythm, normal heart sounds and intact distal pulses.   Pulmonary/Chest: Effort normal and breath sounds normal.  No respiratory distress.  Abdominal: Soft. Bowel sounds are normal. She exhibits distension. She exhibits no mass. There is no tenderness.  Chronic bloating  Musculoskeletal: Normal range of motion.  Neurological: She is alert and oriented to person, place, and time.  Skin: Skin is warm and dry.  Psychiatric: She has a normal mood and affect.     Labs reviewed: Basic Metabolic Panel:  Recent Labs  04/23/13 0916  06/30/13 0928 12/03/13 0833  01/29/14 0414 01/30/14 0235 01/31/14 0530  NA  --   < > 148* 142  < > 142 142 141  K  --   < > 3.8 4.3  < > 3.4* 3.5* 3.8  CL  --   < > 101 101  < > 102 102 99  CO2  --   < > 25 29  < > 24 25 25   GLUCOSE  --   < > 90 86  < > 121* 117* 123*  BUN  --   < > 14 19  < > 6 6 8   CREATININE  --   < > 1.20* 1.45*  < > 0.85 0.81 0.92  CALCIUM  --   < > 9.6 9.5  < > 8.9 9.1 9.5  MG  --   --   --   --   < > 1.5 1.7 1.9  TSH 1.540  --  1.380 3.620  --   --   --   --   < > = values in this interval not displayed. Liver Function Tests:  Recent Labs  01/23/14 0632 01/24/14 0445 01/26/14 0435  AST 23 19 15   ALT 10 12 8   ALKPHOS 92 91 68  BILITOT 0.8 0.8 0.6  PROT 7.9 7.5 6.8  ALBUMIN 4.1 3.6 3.0*   No results found for this basename: LIPASE, AMYLASE,  in the last 8760 hours No results found for this basename: AMMONIA,  in the last 8760 hours CBC:  Recent Labs  06/30/13 0928 12/03/13 0833  01/23/14 0632  01/27/14 0248 01/28/14 0307 01/29/14 0414  WBC 7.7 6.2 11.1*  < > 9.7 10.9* 8.2  NEUTROABS 3.9 3.5 8.8*  --   --   --   --   HGB 13.6 13.5 14.5  < > 11.7* 12.7 12.3  HCT 38.7 41.3 39.9  < > 33.8* 35.4* 35.2*  MCV 83 88 82.4  < > 83.7 83.1 82.8  PLT  --   --  271  < > 208 263 257  < > = values in this interval not displayed. Lipid Panel:  Recent Labs  12/03/13 0833  HDL 59  LDLCALC 103*  TRIG 109  CHOLHDL 3.1   Lab Results  Component Value Date   HGBA1C 5.5 01/26/2014   Assessment/Plan 1. ST (sore throat) -due to NGT for prolonged time -cont symptom mgt with lozenges, chloraseptic spray -if remains painful, can prescribe magic mouthwash with lidocaine  2. Small bowel obstruction due to adhesions - recurrent, bowels have been moving better since last episode, but still has the bloated feeling chronically and some pain off and on that she worries will end up as another obstruction -does not want anymore surgery - CBC With differential/Platelet; Future  3. Type II or unspecified type diabetes mellitus with neurological manifestations, not stated as uncontrolled -cont metformin 500mg  po bid, statin, bp has not been adequate to add ace and she's at risk of renal failure during another episode of SBO which is anticipated will unfortunately continue  to recur  4. Diabetic nephropathy associated with type 2 diabetes mellitus -cont to avoid nsaids and nephrotoxic meds, maintain hydration, hesitant to start ace with her abdominal problems - Basic metabolic panel; Future - Hemoglobin A1c; Future - Lipid panel; Future  5. Atrial fibrillation with RVR -cont eliquis and diltiazem - CBC With differential/Platelet; Future  6. Chronic constipation -cont prn miralax  7. Hypothyroidism due to acquired atrophy of thyroid - TSH; Future -cont synthroid 42mcg daily  Labs/tests ordered: Orders Placed This Encounter  Procedures  . CBC With differential/Platelet     Standing Status: Future     Number of Occurrences: 1     Standing Expiration Date: 10/10/2014  . Basic metabolic panel    Standing Status: Future     Number of Occurrences: 1     Standing Expiration Date: 10/10/2014  . Hemoglobin A1c    Standing Status: Future     Number of Occurrences: 1     Standing Expiration Date: 10/10/2014  . Lipid panel    Standing Status: Future     Number of Occurrences: 1     Standing Expiration Date: 10/10/2014  . TSH    Standing Status: Future     Number of Occurrences: 1     Standing Expiration Date: 10/10/2014    Next appt:  4 mos

## 2014-02-08 NOTE — Patient Instructions (Signed)
Please bring [place] a copy of your living will and health care power of attorney paperwork

## 2014-02-15 ENCOUNTER — Encounter (HOSPITAL_COMMUNITY): Payer: Self-pay | Admitting: Emergency Medicine

## 2014-02-15 ENCOUNTER — Emergency Department (HOSPITAL_COMMUNITY)
Admission: EM | Admit: 2014-02-15 | Discharge: 2014-02-15 | Disposition: A | Discharge status: Home / Self Care | Payer: Medicare Other | Attending: Emergency Medicine | Admitting: Emergency Medicine

## 2014-02-15 DIAGNOSIS — N189 Chronic kidney disease, unspecified: Secondary | ICD-10-CM | POA: Diagnosis present

## 2014-02-15 DIAGNOSIS — I5022 Chronic systolic (congestive) heart failure: Secondary | ICD-10-CM | POA: Diagnosis not present

## 2014-02-15 DIAGNOSIS — E1169 Type 2 diabetes mellitus with other specified complication: Secondary | ICD-10-CM | POA: Diagnosis not present

## 2014-02-15 DIAGNOSIS — M109 Gout, unspecified: Secondary | ICD-10-CM | POA: Insufficient documentation

## 2014-02-15 DIAGNOSIS — F3289 Other specified depressive episodes: Secondary | ICD-10-CM | POA: Diagnosis not present

## 2014-02-15 DIAGNOSIS — E7800 Pure hypercholesterolemia, unspecified: Secondary | ICD-10-CM | POA: Insufficient documentation

## 2014-02-15 DIAGNOSIS — E039 Hypothyroidism, unspecified: Secondary | ICD-10-CM | POA: Diagnosis present

## 2014-02-15 DIAGNOSIS — Z7902 Long term (current) use of antithrombotics/antiplatelets: Secondary | ICD-10-CM | POA: Diagnosis not present

## 2014-02-15 DIAGNOSIS — F411 Generalized anxiety disorder: Secondary | ICD-10-CM | POA: Diagnosis not present

## 2014-02-15 DIAGNOSIS — R040 Epistaxis: Secondary | ICD-10-CM | POA: Diagnosis present

## 2014-02-15 DIAGNOSIS — Z9889 Other specified postprocedural states: Secondary | ICD-10-CM | POA: Diagnosis not present

## 2014-02-15 DIAGNOSIS — I4891 Unspecified atrial fibrillation: Secondary | ICD-10-CM | POA: Diagnosis not present

## 2014-02-15 DIAGNOSIS — Z85038 Personal history of other malignant neoplasm of large intestine: Secondary | ICD-10-CM | POA: Diagnosis not present

## 2014-02-15 DIAGNOSIS — K219 Gastro-esophageal reflux disease without esophagitis: Secondary | ICD-10-CM | POA: Diagnosis not present

## 2014-02-15 DIAGNOSIS — Z862 Personal history of diseases of the blood and blood-forming organs and certain disorders involving the immune mechanism: Secondary | ICD-10-CM | POA: Insufficient documentation

## 2014-02-15 DIAGNOSIS — F329 Major depressive disorder, single episode, unspecified: Secondary | ICD-10-CM | POA: Insufficient documentation

## 2014-02-15 DIAGNOSIS — I129 Hypertensive chronic kidney disease with stage 1 through stage 4 chronic kidney disease, or unspecified chronic kidney disease: Secondary | ICD-10-CM | POA: Diagnosis not present

## 2014-02-15 DIAGNOSIS — E1149 Type 2 diabetes mellitus with other diabetic neurological complication: Secondary | ICD-10-CM | POA: Diagnosis present

## 2014-02-15 DIAGNOSIS — Z87442 Personal history of urinary calculi: Secondary | ICD-10-CM | POA: Insufficient documentation

## 2014-02-15 DIAGNOSIS — Z79899 Other long term (current) drug therapy: Secondary | ICD-10-CM | POA: Insufficient documentation

## 2014-02-15 DIAGNOSIS — I10 Essential (primary) hypertension: Secondary | ICD-10-CM | POA: Diagnosis present

## 2014-02-15 DIAGNOSIS — N289 Disorder of kidney and ureter, unspecified: Secondary | ICD-10-CM | POA: Diagnosis present

## 2014-02-15 LAB — CBC WITH DIFFERENTIAL/PLATELET
BASOS ABS: 0 10*3/uL (ref 0.0–0.1)
Basophils Relative: 0 % (ref 0–1)
Eosinophils Absolute: 0.1 10*3/uL (ref 0.0–0.7)
Eosinophils Relative: 1 % (ref 0–5)
HCT: 34.8 % — ABNORMAL LOW (ref 36.0–46.0)
Hemoglobin: 12 g/dL (ref 12.0–15.0)
LYMPHS PCT: 26 % (ref 12–46)
Lymphs Abs: 2.2 10*3/uL (ref 0.7–4.0)
MCH: 28.4 pg (ref 26.0–34.0)
MCHC: 34.5 g/dL (ref 30.0–36.0)
MCV: 82.3 fL (ref 78.0–100.0)
Monocytes Absolute: 0.6 10*3/uL (ref 0.1–1.0)
Monocytes Relative: 8 % (ref 3–12)
Neutro Abs: 5.5 10*3/uL (ref 1.7–7.7)
Neutrophils Relative %: 65 % (ref 43–77)
Platelets: 471 10*3/uL — ABNORMAL HIGH (ref 150–400)
RBC: 4.23 MIL/uL (ref 3.87–5.11)
RDW: 14.9 % (ref 11.5–15.5)
WBC: 8.4 10*3/uL (ref 4.0–10.5)

## 2014-02-15 LAB — TYPE AND SCREEN
ABO/RH(D): A POS
Antibody Screen: NEGATIVE

## 2014-02-15 LAB — I-STAT TROPONIN, ED: Troponin i, poc: 0 ng/mL (ref 0.00–0.08)

## 2014-02-15 LAB — BASIC METABOLIC PANEL
ANION GAP: 12 (ref 5–15)
BUN: 15 mg/dL (ref 6–23)
CO2: 28 mEq/L (ref 19–32)
CREATININE: 1.4 mg/dL — AB (ref 0.50–1.10)
Calcium: 9.1 mg/dL (ref 8.4–10.5)
Chloride: 102 mEq/L (ref 96–112)
GFR calc Af Amer: 40 mL/min — ABNORMAL LOW (ref >90)
GFR calc non Af Amer: 34 mL/min — ABNORMAL LOW (ref >90)
Glucose, Bld: 192 mg/dL — ABNORMAL HIGH (ref 70–99)
Potassium: 4.1 mEq/L (ref 3.7–5.3)
Sodium: 142 mEq/L (ref 137–147)

## 2014-02-15 MED ORDER — DILTIAZEM HCL 100 MG IV SOLR
5.0000 mg/h | INTRAVENOUS | Status: DC
  Administered 2014-02-15: 5 mg/h via INTRAVENOUS
  Filled 2014-02-15: qty 100

## 2014-02-15 MED ORDER — DILTIAZEM HCL ER COATED BEADS 240 MG PO CP24
240.0000 mg | ORAL_CAPSULE | Freq: Once | ORAL | Status: AC
  Administered 2014-02-15: 240 mg via ORAL
  Filled 2014-02-15: qty 1

## 2014-02-15 MED ORDER — CEPHALEXIN 500 MG PO CAPS: 500.0000 mg | ORAL_CAPSULE | Freq: Four times a day (QID) | ORAL | Status: DC

## 2014-02-15 MED ORDER — HYDROCODONE-ACETAMINOPHEN 5-325 MG PO TABS: 0.5000 | ORAL_TABLET | ORAL | Status: DC | PRN

## 2014-02-15 MED ORDER — HYDROCODONE-ACETAMINOPHEN 5-325 MG PO TABS
0.5000 | ORAL_TABLET | Freq: Once | ORAL | Status: AC
  Administered 2014-02-15: 0.5 via ORAL
  Filled 2014-02-15: qty 1

## 2014-02-15 MED ORDER — MORPHINE SULFATE 2 MG/ML IJ SOLN
2.0000 mg | Freq: Once | INTRAMUSCULAR | Status: AC
  Administered 2014-02-15: 2 mg via INTRAVENOUS
  Filled 2014-02-15: qty 1

## 2014-02-15 MED ORDER — METOPROLOL TARTRATE 25 MG PO TABS
25.0000 mg | ORAL_TABLET | Freq: Two times a day (BID) | ORAL | Status: DC
  Administered 2014-02-15: 25 mg via ORAL
  Filled 2014-02-15: qty 1

## 2014-02-15 MED ORDER — DILTIAZEM LOAD VIA INFUSION
15.0000 mg | Freq: Once | INTRAVENOUS | Status: AC
  Administered 2014-02-15: 15 mg via INTRAVENOUS
  Filled 2014-02-15: qty 15

## 2014-02-15 MED ORDER — ONDANSETRON HCL 4 MG/2ML IJ SOLN
4.0000 mg | Freq: Once | INTRAMUSCULAR | Status: AC
  Administered 2014-02-15: 4 mg via INTRAVENOUS
  Filled 2014-02-15: qty 2

## 2014-02-15 MED ORDER — METOPROLOL TARTRATE 25 MG PO TABS: 25.0000 mg | ORAL_TABLET | Freq: Two times a day (BID) | ORAL | Status: DC

## 2014-02-15 NOTE — Consult Note (Signed)
Pt. Seen and examined. Agree with the NP/PA-C note as written.  Well known patient of mine with a history of a-fib which is chronic/permanent. Recently admitted for SBO and had an NG Tube. On chronic Eliquis for anticoagulation. Has been having nosebleeds recently, today was the most significant. She was on asa, Plavix and warfarin in the past, but had compliance issues. She did not take her am cardizem. She is now in a-fib with RVR. Rate is improved on a diltiazem gtts. BP is elevated. Her epistaxis has stopped with a nasal balloon in place.  She has follow-up with ENT this week.   Recommend increasing her cardizem to 240 mg daily and adding metoprolol tartrate 25 mg BID. Please give first dose of these in the ER now and then wean off of the cardizem gtts. Hold Eliquis until she sees ENT and follows up with me in the office next week on 8/4.  She can be discharged home from a cardiac standpoint.  Thanks for notifying me.  Pixie Casino, MD, [facility] Attending Cardiologist [place]

## 2014-02-15 NOTE — ED Notes (Signed)
Pt. Stated, My nose started bleeding this morning around 0800

## 2014-02-15 NOTE — ED Notes (Signed)
Family at bedside. 

## 2014-02-15 NOTE — ED Provider Notes (Signed)
CSN: [id number]     Arrival date & time 02/15/14  1125 History   First MD Initiated Contact with Patient 02/15/14 1141     Chief Complaint  Patient presents with  . Epistaxis     (Consider location/radiation/quality/duration/timing/severity/associated sxs/prior Treatment) HPI Comments: Patient presents to the ER for evaluation of nosebleed. Patient reports that she had onset of bleeding from the left side of her nose the a.m. The area has been bleeding continuously since then, she has noticed some blood coming out of the right side as well. Patient denies any injury to the nose. No chest pain, shortness of breath, dizziness or passing out.  Patient is a 80 y.o. female presenting with nosebleeds.  Epistaxis   Past Medical History  Diagnosis Date  . Colon cancer 07/1997  . Hypertension   . Diabetes mellitus   . CHF (congestive heart failure)   . AAA (abdominal aortic aneurysm)   . Atrial fibrillation   . Hypercholesteremia   . Kidney stone     renal calculi  . Small bowel obstruction due to adhesions 05/16/2012  . Hypothyroidism   . Arthritis   . Anemia   . Cholelithiasis   . Perforation of bile duct   . Hemorrhage of rectum and anus   . Swelling, mass, or lump in head and neck   . Cervicalgia   . Hypoglycemia, unspecified   . Pain in joint, lower leg   . Anxiety   . Shortness of breath   . Acute upper respiratory infections of unspecified site   . Depression   . Thoracic aneurysm without mention of rupture   . GERD (gastroesophageal reflux disease)   . Proteinuria   . Other specified cardiac dysrhythmias(427.89)   . Congestive heart failure, unspecified   . Electrolyte and fluid disorders not elsewhere classified   . Other chronic allergic conjunctivitis   . Hypopotassemia   . Chronic systolic heart failure   . Dizziness and giddiness   . Malignant neoplasm of colon, unspecified site   . Other and unspecified hyperlipidemia   . Gout, unspecified   . Atrial  fibrillation   . Aortic aneurysm of unspecified site without mention of rupture   . Pain in joint, site unspecified   . Palpitations   . CKD (chronic kidney disease)    Past Surgical History  Procedure Laterality Date  . Hemicolectomy  08/11/1997  . Kidney stone surgery    . Colon surgery      to remove colon ca  . Cholecystectomy  05/14/2012    Procedure: LAPAROSCOPIC CHOLECYSTECTOMY WITH INTRAOPERATIVE CHOLANGIOGRAM;  Surgeon: Haywood Lasso, MD;  Location: [place];  Service: General;  Laterality: N/A;  . Ercp  05/17/2012    Procedure: ENDOSCOPIC RETROGRADE CHOLANGIOPANCREATOGRAPHY (ERCP);  Surgeon: Missy Sabins, MD;  Location: [place];  Service: Gastroenterology;  Laterality: N/A;  . Ercp  08/12/2012    Procedure: ENDOSCOPIC RETROGRADE CHOLANGIOPANCREATOGRAPHY (ERCP);  Surgeon: Missy Sabins, MD;  Location: [facility] ENDOSCOPY;  Service: Endoscopy;  Laterality: N/A;  . Transthoracic echocardiogram  11/2009    EF 45-50%, mild-mod AV regurg; mild MR; mod TR; ascending aorta mildly dilated  . Cardiac catheterization  2008    moderate severe pulm HTN; with elevted pulm capillary wedge pressure    Family History  Problem Relation Age of Onset  . Heart disease Mother   . Stroke Father   . Hypertension Daughter   . Hypertension Son   . Diabetes Sister   . Hypertension Son  History  Substance Use Topics  . Smoking status: Never Smoker   . Smokeless tobacco: Never Used  . Alcohol Use: No   OB History   Grav Para Term Preterm Abortions TAB SAB Ect Mult Living                 Review of Systems  HENT: Positive for nosebleeds.   All other systems reviewed and are negative.     Allergies  Iohexol; Z-pak; and Iodinated diagnostic agents  Home Medications   Prior to Admission medications   Medication Sig Start Date End Date Taking? Authorizing Provider  allopurinol (ZYLOPRIM) 100 MG tablet Take 100 mg by mouth daily.   Yes Historical Provider, MD  apixaban (ELIQUIS) 5 MG TABS  tablet Take 5 mg by mouth 2 (two) times daily.   Yes Historical Provider, MD  diltiazem (CARTIA XT) 180 MG 24 hr capsule Take 1 capsule (180 mg total) by mouth daily. 11/25/13  Yes Pixie Casino, MD  furosemide (LASIX) 40 MG tablet Take 40 mg by mouth 2 (two) times daily.   Yes Historical Provider, MD  gabapentin (NEURONTIN) 100 MG capsule Take 100 mg by mouth 3 (three) times daily.   Yes Historical Provider, MD  glipiZIDE (GLUCOTROL) 10 MG tablet Take 10 mg by mouth daily before breakfast.   Yes Historical Provider, MD  levothyroxine (SYNTHROID, LEVOTHROID) 50 MCG tablet Take 1 tablet (50 mcg total) by mouth daily. 07/01/13  Yes Pricilla Larsson, NP  magnesium oxide (MAG-OX) 400 MG tablet Take 400 mg by mouth 3 (three) times daily.   Yes Historical Provider, MD  metFORMIN (GLUCOPHAGE) 500 MG tablet Take 1 tablet (500 mg total) by mouth 2 (two) times daily with a meal. 01/18/14  Yes [name] [name], DO  polyethylene glycol (MIRALAX / GLYCOLAX) packet Take 17 g by mouth daily as needed for mild constipation.    Yes Historical Provider, MD  potassium chloride SA (K-DUR,KLOR-CON) 20 MEQ tablet Take 20 mEq by mouth daily.   Yes Historical Provider, MD  simvastatin (ZOCOR) 40 MG tablet Take 40 mg by mouth daily.   Yes Historical Provider, MD   BP 176/118  Pulse 131  Temp(Src) 98.2 F (36.8 C) (Oral)  Resp 20  Ht 5\' 9"  (1.753 m)  Wt 173 lb (78.472 kg)  BMI 25.54 kg/m2  SpO2 96% Physical Exam  Constitutional: She is oriented to person, place, and time. She appears well-developed and well-nourished. No distress.  HENT:  Head: Normocephalic and atraumatic.  Right Ear: Hearing normal.  Left Ear: Hearing normal.  Nose: Epistaxis (left) is observed.  Mouth/Throat: Oropharynx is clear and moist and mucous membranes are normal.  Eyes: Conjunctivae and EOM are normal. Pupils are equal, round, and reactive to light.  Neck: Normal range of motion. Neck supple.  Cardiovascular: Regular rhythm, S1 normal  and S2 normal.  Exam reveals no gallop and no friction rub.   No murmur heard. Pulmonary/Chest: Effort normal and breath sounds normal. No respiratory distress. She exhibits no tenderness.  Abdominal: Soft. Normal appearance and bowel sounds are normal. There is no hepatosplenomegaly. There is no tenderness. There is no rebound, no guarding, no tenderness at McBurney's point and negative Murphy's sign. No hernia.  Musculoskeletal: Normal range of motion.  Neurological: She is alert and oriented to person, place, and time. She has normal strength. No cranial nerve deficit or sensory deficit. Coordination normal. GCS eye subscore is 4. GCS verbal subscore is 5. GCS motor subscore is 6.  Skin: Skin is warm, dry and intact. No rash noted. No cyanosis.  Psychiatric: She has a normal mood and affect. Her speech is normal and behavior is normal. Thought content normal.    ED Course  Procedures (including critical care time) Labs Review Labs Reviewed  CBC WITH DIFFERENTIAL - Abnormal; Notable for the following:    HCT 34.8 (*)    Platelets 471 (*)    All other components within normal limits  BASIC METABOLIC PANEL - Abnormal; Notable for the following:    Glucose, Bld 192 (*)    Creatinine, Ser 1.40 (*)    GFR calc non Af Amer 34 (*)    GFR calc Af Amer 40 (*)    All other components within normal limits  TYPE AND SCREEN    Imaging Review No results found.   EKG Interpretation   Date/Time:  Monday February 15 2014 12:55:50 EDT Ventricular Rate:  136 PR Interval:    QRS Duration: 103 QT Interval:  349 QTC Calculation: 525 R Axis:   26 Text Interpretation:  Atrial fibrillation RSR' in V1 or V2, probably  normal variant Repolarization abnormality, prob rate related Prolonged QT  interval No significant change since last tracing Confirmed by POLLINA   MD, [place] [phone]) on 02/15/2014 1:31:22 PM      MDM   Final diagnoses:  None   Patient presents to the ER for evaluation of  nosebleed. Patient had onset of left-sided nosebleed this morning. She had a fair amount of bleeding at before arrival and was bleeding briskly upon my evaluation. Bleeding was primarily from the left, but the bleeding was also backing up around into the right side of the nose. I was concerned about posterior bleed. Based on the fact that the patient is on Eliquis, I did not feel that she is a candidate for cauterization. An anterior-posterior 9.0 cm Rapid Rhino was inserted on the left side and the bleeding immediately stopped. There has not been any further bleeding during the period of observation after placement. I did discuss the case with Doctor Wilburn Cornelia on-call for ENT. He did not feel that the patient regarding further interventions at this time. He recommended antibiotics while the packing is in place, continue packing for 5 days. He reports that the patient did not need to be seen during hospitalization, to be seen in the office after discharge for packing removal.  Patient was found to be in atrial fibrillation with rapid ventricular response. She has had atrial fibrillation previously. Patient takes diltiazem at home. She has missed her morning doses. This was also very hypertensive upon arrival. Patient was given IV Cardizem by bolus and drip in an attempt to control her rate and blood pressure. She has only had a partial response.   Cardiology has evaluated the patient. It was felt that the patient did not need to be admitted. Pressure and heart rate are improved with IV Cardizem. It is recommended the patient be transitioned back to her by mouth Cardizem, discharged and followup in the office. Anticoagulation will be held until office visit.   Orpah Greek, MD 02/15/14 [phone]

## 2014-02-15 NOTE — Consult Note (Signed)
Nicole [name] is an 80 y.o. female.   Chief Complaint:  Epistaxis HPI:   Nicole [name] is a 80 year old female with history of chronic atrial fibrillation and controlled ventricular response. She was previously followed by Dr. Rex Kras, has a history of congestive heart failure with an EF of about 45% and moderate MR in the past. She has a thoracic aneurysm which has been asymptomatic, as well as hypertension, diabetes, dyslipidemia, and gallbladder surgery which was complicated by bleeding issues. She has been deemed not a good candidate for Coumadin due to a number of issues with compliance by Dr. Rex Kras, and has been on aspirin and Plavix. Unfortunately, with regard to the Plavix she had complicated bleeding postoperatively from her gallbladder surgery and Plavix was discontinued due to this. It turns out that there is little benefit to it on top of aspirin for stroke reduction. As far as her stroke risk is concerned, it is actually fairly high considering she is in chronic atrial fibrillation and does have a history of hypertension and diabetes and is a female with a CHADS2 score of 3, a CHADS VASc score of 3-1/2 or 4. Dr. Debara Pickett discussed anticoagulation during his last visit with her, as she is quite a vital person and would certainly be markedly debilitated if she were to have a stroke.  He started her on Eliquis in August last year.   She was admitted earlier this month with a SBO.   She presents today with a nosebleed, dizziness and was found in Afib RVR. The patient currently denies nausea, vomiting, fever, chest pain, shortness of breath, orthopnea,  PND, cough, congestion, abdominal pain, hematochezia, melena, lower extremity edema-worse than usual.  She missed her cardizem this morning.  In the ER she was given 66m cardizem bolus and is on 11mhr.    Medications: Prior to Admission medications   Medication Sig Start Date End Date Taking? Authorizing Provider  allopurinol  (ZYLOPRIM) 100 MG tablet Take 100 mg by mouth daily.   Yes Historical Provider, MD  apixaban (ELIQUIS) 5 MG TABS tablet Take 5 mg by mouth 2 (two) times daily.   Yes Historical Provider, MD  diltiazem (CARTIA XT) 180 MG 24 hr capsule Take 1 capsule (180 mg total) by mouth daily. 11/25/13  Yes KePixie CasinoMD  furosemide (LASIX) 40 MG tablet Take 40 mg by mouth 2 (two) times daily.   Yes Historical Provider, MD  gabapentin (NEURONTIN) 100 MG capsule Take 100 mg by mouth 3 (three) times daily.   Yes Historical Provider, MD  glipiZIDE (GLUCOTROL) 10 MG tablet Take 10 mg by mouth daily before breakfast.   Yes Historical Provider, MD  levothyroxine (SYNTHROID, LEVOTHROID) 50 MCG tablet Take 1 tablet (50 mcg total) by mouth daily. 07/01/13  Yes JePricilla LarssonNP  magnesium oxide (MAG-OX) 400 MG tablet Take 400 mg by mouth 3 (three) times daily.   Yes Historical Provider, MD  metFORMIN (GLUCOPHAGE) 500 MG tablet Take 1 tablet (500 mg total) by mouth 2 (two) times daily with a meal. 01/18/14  Yes [name] [name], DO  polyethylene glycol (MIRALAX / GLYCOLAX) packet Take 17 g by mouth daily as needed for mild constipation.    Yes Historical Provider, MD  potassium chloride SA (K-DUR,KLOR-CON) 20 MEQ tablet Take 20 mEq by mouth daily.   Yes Historical Provider, MD  simvastatin (ZOCOR) 40 MG tablet Take 40 mg by mouth daily.   Yes Historical Provider, MD  Past Medical History  Diagnosis Date  . Colon cancer 07/1997  . Hypertension   . Diabetes mellitus   . CHF (congestive heart failure)   . AAA (abdominal aortic aneurysm)   . Atrial fibrillation   . Hypercholesteremia   . Kidney stone     renal calculi  . Small bowel obstruction due to adhesions 05/16/2012  . Hypothyroidism   . Arthritis   . Anemia   . Cholelithiasis   . Perforation of bile duct   . Hemorrhage of rectum and anus   . Swelling, mass, or lump in head and neck   . Cervicalgia   . Hypoglycemia, unspecified   . Pain in joint,  lower leg   . Anxiety   . Shortness of breath   . Acute upper respiratory infections of unspecified site   . Depression   . Thoracic aneurysm without mention of rupture   . GERD (gastroesophageal reflux disease)   . Proteinuria   . Other specified cardiac dysrhythmias(427.89)   . Congestive heart failure, unspecified   . Electrolyte and fluid disorders not elsewhere classified   . Other chronic allergic conjunctivitis   . Hypopotassemia   . Chronic systolic heart failure   . Dizziness and giddiness   . Malignant neoplasm of colon, unspecified site   . Other and unspecified hyperlipidemia   . Gout, unspecified   . Atrial fibrillation   . Aortic aneurysm of unspecified site without mention of rupture   . Pain in joint, site unspecified   . Palpitations   . CKD (chronic kidney disease)     Past Surgical History  Procedure Laterality Date  . Hemicolectomy  08/11/1997  . Kidney stone surgery    . Colon surgery      to remove colon ca  . Cholecystectomy  05/14/2012    Procedure: LAPAROSCOPIC CHOLECYSTECTOMY WITH INTRAOPERATIVE CHOLANGIOGRAM;  Surgeon: Haywood Lasso, MD;  Location: [facility];  Service: General;  Laterality: N/A;  . Ercp  05/17/2012    Procedure: ENDOSCOPIC RETROGRADE CHOLANGIOPANCREATOGRAPHY (ERCP);  Surgeon: Missy Sabins, MD;  Location: [facility];  Service: Gastroenterology;  Laterality: N/A;  . Ercp  08/12/2012    Procedure: ENDOSCOPIC RETROGRADE CHOLANGIOPANCREATOGRAPHY (ERCP);  Surgeon: Missy Sabins, MD;  Location: [facility] ENDOSCOPY;  Service: Endoscopy;  Laterality: N/A;  . Transthoracic echocardiogram  11/2009    EF 45-50%, mild-mod AV regurg; mild MR; mod TR; ascending aorta mildly dilated  . Cardiac catheterization  2008    moderate severe pulm HTN; with elevted pulm capillary wedge pressure     Family History  Problem Relation Age of Onset  . Heart disease Mother   . Stroke Father   . Hypertension Daughter   . Hypertension Son   . Diabetes Sister   .  Hypertension Son    Social History:  reports that she has never smoked. She has never used smokeless tobacco. She reports that she does not drink alcohol or use illicit drugs.  Allergies:  Allergies  Allergen Reactions  . Iohexol      Code: VOM, Desc: PT REPORTS VOMITING W/ IVP DYE- ARS 12/26/08, Onset Date: 42353614   . Z-Pak [Azithromycin]   . Iodinated Diagnostic Agents Nausea Only    PT HAS HAD SINGULAR INCIDENT OF NAUSEA  W/ IV CONTYRAST INJECTION, NONE IF INJECTED SLOWLY//A.CALHOUN     (Not in a hospital admission)  Results for orders placed during the hospital encounter of 02/15/14 (from the past 48 hour(s))  CBC WITH DIFFERENTIAL  Status: Abnormal   Collection Time    02/15/14 11:53 AM      Result Value Ref Range   WBC 8.4  4.0 - 10.5 K/uL   RBC 4.23  3.87 - 5.11 MIL/uL   Hemoglobin 12.0  12.0 - 15.0 g/dL   HCT 34.8 (*) 36.0 - 46.0 %   MCV 82.3  78.0 - 100.0 fL   MCH 28.4  26.0 - 34.0 pg   MCHC 34.5  30.0 - 36.0 g/dL   RDW 14.9  11.5 - 15.5 %   Platelets 471 (*) 150 - 400 K/uL   Neutrophils Relative % 65  43 - 77 %   Neutro Abs 5.5  1.7 - 7.7 K/uL   Lymphocytes Relative 26  12 - 46 %   Lymphs Abs 2.2  0.7 - 4.0 K/uL   Monocytes Relative 8  3 - 12 %   Monocytes Absolute 0.6  0.1 - 1.0 K/uL   Eosinophils Relative 1  0 - 5 %   Eosinophils Absolute 0.1  0.0 - 0.7 K/uL   Basophils Relative 0  0 - 1 %   Basophils Absolute 0.0  0.0 - 0.1 K/uL  BASIC METABOLIC PANEL     Status: Abnormal   Collection Time    02/15/14 11:53 AM      Result Value Ref Range   Sodium 142  137 - 147 mEq/L   Potassium 4.1  3.7 - 5.3 mEq/L   Chloride 102  96 - 112 mEq/L   CO2 28  19 - 32 mEq/L   Glucose, Bld 192 (*) 70 - 99 mg/dL   BUN 15  6 - 23 mg/dL   Creatinine, Ser 1.40 (*) 0.50 - 1.10 mg/dL   Calcium 9.1  8.4 - 10.5 mg/dL   GFR calc non Af Amer 34 (*) >90 mL/min   GFR calc Af Amer 40 (*) >90 mL/min   Comment: (NOTE)     The eGFR has been calculated using the CKD EPI equation.      This calculation has not been validated in all clinical situations.     eGFR's persistently <90 mL/min signify possible Chronic Kidney     Disease.   Anion gap 12  5 - 15  TYPE AND SCREEN     Status: None   Collection Time    02/15/14 12:03 PM      Result Value Ref Range   ABO/RH(D) A POS     Antibody Screen NEG     Sample Expiration 02/18/2014    I-STAT TROPOININ, ED     Status: None   Collection Time    02/15/14  2:46 PM      Result Value Ref Range   Troponin i, poc 0.00  0.00 - 0.08 ng/mL   Comment 3            Comment: Due to the release kinetics of cTnI,     a negative result within the first hours     of the onset of symptoms does not rule out     myocardial infarction with certainty.     If myocardial infarction is still suspected,     repeat the test at appropriate intervals.   No results found.  Review of Systems  Constitutional: Negative for fever and diaphoresis.  HENT: Positive for nosebleeds and sore throat (Since NG tube was placed). Negative for congestion.   Respiratory: Negative for cough and shortness of breath.   Cardiovascular: Positive for leg  swelling (No more than usual). Negative for chest pain and orthopnea.  Gastrointestinal: Negative for nausea, vomiting, abdominal pain, blood in stool and melena.  Genitourinary: Negative for hematuria.  Neurological: Positive for dizziness and headaches (left side). C All other systems reviewed and are negative.   Blood pressure 171/98, pulse 134, temperature 98.2 F (36.8 C), temperature source Oral, resp. rate 19, height 5' 9"  (1.753 m), weight 173 lb (78.472 kg), SpO2 97.00%. Physical Exam  Nursing note and vitals reviewed. Constitutional: She is oriented to person, place, and time. She appears well-developed and well-nourished. No distress.  HENT:  Head: Normocephalic and atraumatic.  Mouth/Throat: Oropharynx is clear and moist.  Eyes: EOM are normal. Pupils are equal, round, and reactive to light. No  scleral icterus.  Neck: Normal range of motion. Neck supple.  Cardiovascular: S1 normal and S2 normal.  An irregularly irregular rhythm present. Tachycardia present.   No murmur heard. Pulses:      Radial pulses are 2+ on the right side, and 2+ on the left side.       Dorsalis pedis pulses are 2+ on the right side, and 2+ on the left side.  Respiratory: Effort normal and breath sounds normal.  GI: Soft. Bowel sounds are normal. She exhibits no distension. There is no tenderness.  Musculoskeletal: She exhibits no edema.  Lymphadenopathy:    She has no cervical adenopathy.  Neurological: She is alert and oriented to person, place, and time. She exhibits normal muscle tone.  Skin: Skin is warm and dry.  Psychiatric: She has a normal mood and affect.     Assessment/Plan Active Problems:   Epistaxis   Atrial fibrillation with RVR  Acute on Chronic renal insuf.   Type II or unspecified type diabetes mellitus with neurological manifestations, not stated as uncontrolled   HTN (hypertension)   Hypothyroidism  Plan:   Admit to telemetry.  Will change to PO cardizem and increase the dose to 274m.  Add lopressor 291mBID.  This would help her BP as well. Stop eliquis and consider alternate coagulation with coumadin.  Check TSH.  Her renal function has decreased since earlier this month.  SCr is elevated with decreased GFR, which is has been before. Recheck BMET in a week.. Marland Kitchen     HATarri FullerPA-C 02/15/2014, 3:54 PM

## 2014-02-15 NOTE — Discharge Instructions (Signed)
Stop taking Eliquis - Doctor Debara Pickett will start another medication next week.  Atrial Fibrillation Atrial fibrillation is a condition that causes your heart to beat irregularly. It may also cause your heart to beat faster than normal. Atrial fibrillation can prevent your heart from pumping blood normally. It increases your risk of stroke and heart problems. HOME CARE  Take medications as told by your doctor.  Only take medications that your doctor says are safe. Some medications can make the condition worse or happen again.  If blood thinners were prescribed by your doctor, take them exactly as told. Too much can cause bleeding. Too little and you will not have the needed protection against stroke and other problems.  Perform blood tests at home if told by your doctor.  Perform blood tests exactly as told by your doctor.  Do not drink alcohol.  Do not drink beverages with caffeine such as coffee, soda, and some teas.  Maintain a healthy weight.  Do not use diet pills unless your doctor says they are safe. They may make heart problems worse.  Follow diet instructions as told by your doctor.  Exercise regularly as told by your doctor.  Keep all follow-up appointments. GET HELP IF:  You notice a change in the speed, rhythm, or strength of your heartbeat.  You suddenly begin peeing (urinating) more often.  You get tired more easily when moving or exercising. GET HELP RIGHT AWAY IF:   You have chest or belly (abdominal) pain.  You feel sick to your stomach (nauseous).  You are short of breath.  You suddenly have swollen feet and ankles.  You feel dizzy.  You face, arms, or legs feel numb or weak.  There is a change in your vision or speech. MAKE SURE YOU:   Understand these instructions.  Will watch your condition.  Will get help right away if you are not doing well or get worse. Document Released: 04/17/2008 Document Revised: 11/23/2013 Document Reviewed:  08/19/2012 [facility] Patient Information 2015 [place], [place]. This information is not intended to replace advice given to you by your health care provider. Make sure you discuss any questions you have with your health care provider.  Nosebleed Nosebleeds can be caused by many conditions, including trauma, infections, polyps, foreign bodies, dry mucous membranes or climate, medicines, and air conditioning. Most nosebleeds occur in the front of the nose. Because of this location, most nosebleeds can be controlled by pinching the nostrils gently and continuously for at least 10 to 20 minutes. The long, continuous pressure allows enough time for the blood to clot. If pressure is released during that 10 to 20 minute time period, the process may have to be started again. The nosebleed may stop by itself or quit with pressure, or it may need concentrated heating (cautery) or pressure from packing. HOME CARE INSTRUCTIONS   If your nose was packed, try to maintain the pack inside until your health care provider removes it. If a gauze pack was used and it starts to fall out, gently replace it or cut the end off. Do not cut if a balloon catheter was used to pack the nose. Otherwise, do not remove unless instructed.  Avoid blowing your nose for 12 hours after treatment. This could dislodge the pack or clot and start the bleeding again.  If the bleeding starts again, sit up and bend forward, gently pinching the front half of your nose continuously for 20 minutes.  If bleeding was caused by dry mucous membranes,  use over-the-counter saline nasal spray or gel. This will keep the mucous membranes moist and allow them to heal. If you must use a lubricant, choose the water-soluble variety. Use it only sparingly and not within several hours of lying down.  Do not use petroleum jelly or mineral oil, as these may drip into the lungs and cause serious problems.  Maintain humidity in your home by using less air  conditioning or by using a humidifier.  Do not use aspirin or medicines which make bleeding more likely. Your health care provider can give you recommendations on this.  Resume normal activities as you are able, but try to avoid straining, lifting, or bending at the waist for several days.  If the nosebleeds become recurrent and the cause is unknown, your health care provider may suggest laboratory tests. SEEK MEDICAL CARE IF: You have a fever. SEEK IMMEDIATE MEDICAL CARE IF:   Bleeding recurs and cannot be controlled.  There is unusual bleeding from or bruising on other parts of the body.  Nosebleeds continue.  There is any worsening of the condition which originally brought you in.  You become light-headed, feel faint, become sweaty, or vomit blood. MAKE SURE YOU:   Understand these instructions.  Will watch your condition.  Will get help right away if you are not doing well or get worse. Document Released: 04/18/2005 Document Revised: 11/23/2013 Document Reviewed: 06/09/2009 [facility] Patient Information 2015 [place], [place]. This information is not intended to replace advice given to you by your health care provider. Make sure you discuss any questions you have with your health care provider.

## 2014-02-23 ENCOUNTER — Ambulatory Visit (INDEPENDENT_AMBULATORY_CARE_PROVIDER_SITE_OTHER): Payer: Medicare Other | Admitting: Internal Medicine

## 2014-02-23 ENCOUNTER — Encounter: Payer: Self-pay | Admitting: Internal Medicine

## 2014-02-23 ENCOUNTER — Telehealth: Payer: Self-pay | Admitting: Internal Medicine

## 2014-02-23 VITALS — BP 178/88 | HR 92 | Ht 68.0 in | Wt 169.0 lb

## 2014-02-23 DIAGNOSIS — I712 Thoracic aortic aneurysm, without rupture, unspecified: Secondary | ICD-10-CM

## 2014-02-23 DIAGNOSIS — I4891 Unspecified atrial fibrillation: Secondary | ICD-10-CM

## 2014-02-23 DIAGNOSIS — I10 Essential (primary) hypertension: Secondary | ICD-10-CM

## 2014-02-23 DIAGNOSIS — I482 Chronic atrial fibrillation, unspecified: Secondary | ICD-10-CM

## 2014-02-23 DIAGNOSIS — R040 Epistaxis: Secondary | ICD-10-CM

## 2014-02-23 DIAGNOSIS — I509 Heart failure, unspecified: Secondary | ICD-10-CM

## 2014-02-23 MED ORDER — DILTIAZEM HCL ER COATED BEADS 240 MG PO CP24: 240.0000 mg | ORAL_CAPSULE | Freq: Every day | ORAL | Status: DC

## 2014-02-23 MED ORDER — WARFARIN SODIUM 5 MG PO TABS: 5.0000 mg | ORAL_TABLET | Freq: Every day | ORAL | Status: DC

## 2014-02-23 NOTE — Patient Instructions (Addendum)
INCREASE CARDIZEM TO 240MG  ONCE DAILY.   STOP ELIQUIS.   START WARFARIN 5MG  ONCE DAILY PLEASE SCHEDULE AN APPOINTMENT WITH KRISTIN (PHARMACIST) ON Friday [facility].  (for new coumadin start)  DR. HILTY RECOMMENDS TO USE A SALINE NASAL SPRAY AT NIGHT.  Your physician wants you to follow-up in: 3 months. You will receive a reminder letter in the mail two months in advance. If you don't receive a letter, please call our office to schedule the follow-up appointment.   Warfarin: What You Need to Know Warfarin is an anticoagulant. Anticoagulants help prevent the formation of blood clots. They also help stop the growth of blood clots. Warfarin is sometimes referred to as a "blood thinner."  Normally, when body tissues are cut or damaged, the blood clots in order to prevent blood loss. Sometimes clots form inside your blood vessels and obstruct the flow of blood through your circulatory system (thrombosis). These clots may travel through your bloodstream and become lodged in smaller blood vessels in your brain, which can cause a stroke, or in your lungs (pulmonary embolism). WHO SHOULD USE WARFARIN? Warfarin is prescribed for people at risk of developing harmful blood clots:  People with surgically implanted mechanical heart valves, irregular heart rhythms called atrial fibrillation, and certain clotting disorders.  People who have developed harmful blood clotting in the past, including those who have had a stroke or a pulmonary embolism, or thrombosis in their legs (deep vein thrombosis [DVT]).  People with an existing blood clot, such as a pulmonary embolism. WARFARIN DOSING Warfarin tablets come in different strengths. Each tablet strength is a different color, with the amount of warfarin (in milligrams) clearly printed on the tablet. If the color of your tablet is different than usual when you receive a new prescription, report it immediately to your pharmacist or health care  provider. WARFARIN MONITORING The goal of warfarin therapy is to lessen the clotting tendency of blood but not prevent clotting completely. Your health care provider will monitor the anticoagulation effect of warfarin closely and adjust your dose as needed. For your safety, blood tests called prothrombin time (PT) or international normalized ratio (INR) are used to measure the effects of warfarin. Both of these tests can be done with a finger stick or a blood draw. The longer it takes the blood to clot, the higher the PT or INR. Your health care provider will inform you of your "target" PT or INR range. If, at any time, your PT or INR is above the target range, there is a risk of bleeding. If your PT or INR is below the target range, there is a risk of clotting. Whether you are started on warfarin while you are in the hospital or in your health care provider's office, you will need to have your PT or INR checked within one week of starting the medicine. Initially, some people are asked to have their PT or INR checked as much as twice a week. Once you are on a stable maintenance dose, the PT or INR is checked less often, usually once every 2 to 4 weeks. The warfarin dose may be adjusted if the PT or INR is not within the target range. It is important to keep all laboratory and health care provider follow-up appointments. Not keeping appointments could result in a chronic or permanent injury, pain, or disability because warfarin is a medicine that requires close monitoring. WHAT ARE THE SIDE EFFECTS OF WARFARIN?  Too much warfarin can cause bleeding (hemorrhage) from  any part of the body. This may include bleeding from the gums, blood in the urine, bloody or dark stools, a nosebleed that is not easily stopped, coughing up blood, or vomiting blood.  Too little warfarin can increase the risk of blood clots.  Too little or too much warfarin can also increase the risk of a stroke.  Warfarin use may cause a  skin rash or irritation, an unusual fever, continual nausea or stomach upset, or severe pain in your joints or back. SPECIAL PRECAUTIONS WHILE TAKING WARFARIN Warfarin should be taken exactly as directed. It is very important to take warfarin as directed since bleeding or blood clots could result in chronic or permanent injury, pain, or disability.  Take your medicine at the same time every day. If you forget to take your dose, you can take it if it is within 6 hours of when it was due.  Do not change the dose of warfarin on your own to make up for missed or extra doses.  If you miss more than 2 doses in a row, you should contact your health care provider for advice. Avoid situations that cause bleeding. You may have a tendency to bleed more easily than usual while taking warfarin. The following actions can limit bleeding:  Using a softer toothbrush.  Flossing with waxed floss rather than unwaxed floss.  Shaving with an Copy rather than a blade.  Limiting the use of sharp objects.  Avoiding potentially harmful activities, such as contact sports. Warfarin and Pregnancy or Breastfeeding  Warfarin is not advised during the first trimester of pregnancy due to an increased risk of birth defects. In certain situations, a woman may take warfarin after her first trimester of pregnancy. A woman who becomes pregnant or plans to become pregnant while taking warfarin should notify her health care provider immediately.  Although warfarin does not pass into breast milk, a woman who wishes to breastfeed while taking warfarin should also consult with her health care provider. Alcohol, Smoking, and Illicit Drug Use  Alcohol affects how warfarin works in the body. It is best to avoid alcoholic drinks or consume very small amounts while taking warfarin. In general, alcohol intake should be limited to 1 oz (30 mL) of liquor, 6 oz (180 mL) of wine, or 12 oz (360 mL) of beer each day. Notify your  health care provider if you change your alcohol intake.  Smoking affects how warfarin works. It is best to avoid smoking while taking warfarin. Notify your health care provider if you change your smoking habits.  It is best to avoid all illicit drugs while taking warfarin since there are few studies that show how warfarin interacts with these drugs. Other Medicines and Dietary Supplements Many prescription and over-the-counter medicines can interfere with warfarin. Be sure all of your health care providers know you are taking warfarin. Notify your health care provider who prescribed warfarin for you or your pharmacist before starting or stopping any new medicines, including over-the-counter vitamins, dietary supplements, and pain medicines. Your warfarin dose may need to be adjusted. Some common over-the-counter medicines that may increase the risk of bleeding while taking warfarin include:   Acetaminophen.  Aspirin.  Nonsteroidal anti-inflammatory medicines (NSAIDs), such as ibuprofen or naproxen.  Vitamin E. Dietary Considerations  Foods that have moderate or high amounts of vitamin K can interfere with warfarin. Avoid major changes in your diet or notify your health care provider before changing your diet. Eat a consistent amount of foods that  have moderate or high amounts of vitamin K. Eating less foods containing vitamin K can increase the risk of bleeding. Eating more foods containing vitamin K can increase the risk of blood clots. Additional questions about dietary considerations can be discussed with a dietitian. Foods that are very high in vitamin K:  Greens, such as Swiss chard and beet, collard, mustard, or turnip greens (fresh or frozen, cooked).  Kale (fresh or frozen, cooked).  Parsley (raw).  Spinach (cooked). Foods that are high in vitamin K:  Asparagus (frozen, cooked).  Beans, green (frozen, cooked).  Broccoli.  Bok choy (cooked).  Brussels sprouts (fresh or  frozen, cooked).  Cabbage (cooked).   Coleslaw. Foods that are moderately high in vitamin K:  Blueberries.  Black-eyed peas.  Endive (raw).  Green leaf lettuce (raw).  Green scallions (raw).  Kale (raw).  Okra (frozen, cooked).  Plantains (fried).  Romaine lettuce (raw).  Sauerkraut (canned).  Spinach (raw). CALL YOUR CLINIC OR HEALTH CARE PROVIDER IF YOU:  Plan to have any surgery or procedure.  Feel sick, especially if you have diarrhea or vomiting.  Experience or anticipate any major changes in your diet.  Start or stop a prescription or over-the-counter medicine.  Become, plan to become, or think you may be pregnant.  Are having heavier than usual menstrual periods.  Have had a fall, accident, or any symptoms of bleeding or unusual bruising.  Develop an unusual fever. CALL 911 IN THE [place] OR GO TO THE EMERGENCY DEPARTMENT IF YOU:   Think you may be having an allergic reaction to warfarin. The signs of an allergic reaction could include itching, rash, hives, swelling, chest tightness, or trouble breathing.  See signs of blood in your urine. The signs could include reddish, pinkish, or tea-colored urine.  See signs of blood in your stools. The signs could include bright red or black stools.  Vomit or cough up blood. In these instances, the blood could have either a bright red or a "coffee-grounds" appearance.  Have bleeding that will not stop after applying pressure for 30 minutes such as cuts, nosebleeds, or other injuries.  Have severe pain in your joints or back.  Have a new and severe headache.  Have sudden weakness or numbness of your face, arm, or leg, especially on one side of your body.  Have sudden confusion or trouble understanding.  Have sudden trouble seeing in one or both eyes.  Have sudden trouble walking, dizziness, loss of balance, or coordination.  Have trouble speaking or understanding (aphasia). Document Released: 07/09/2005  Document Revised: 11/23/2013 Document Reviewed: 01/02/2013 [facility] Patient Information 2015 [place], [place]. This information is not intended to replace advice given to you by your health care provider. Make sure you discuss any questions you have with your health care provider.

## 2014-02-23 NOTE — Progress Notes (Signed)
OFFICE NOTE  Chief Complaint:  Routine office visit  Primary Care Physician: Hollace Kinnier, DO  HPI:  Nicole [name] is a 80 year old female with history of chronic atrial fibrillation and controlled ventricular response. She was previously followed by Dr. Rex Kras, has a history of congestive heart failure with an EF of about 45% and moderate MR in the past. She has a thoracic aneurysm which has been asymptomatic, as well as hypertension, diabetes, dyslipidemia, and recently was admitted for gallbladder surgery which was complicated by bleeding issues. She has been deemed not a good candidate for Coumadin due to a number of issues with compliance by Dr. Rex Kras, and has been on aspirin and Plavix. Unfortunately, with regard to the Plavix she had complicated bleeding postoperatively from her gallbladder surgery and Plavix was discontinued due to this. It turns out that there is little benefit to it additional Plavix on top of aspirin for stroke reduction. As far as her stroke risk is concerned, it is actually fairly high considering she is in chronic atrial fibrillation and does have a history of hypertension and diabetes and is a female with a CHADS2 score of 3, a CHADS VASc score of 3-1/2 or 4.  Based on this, I recommended starting her on Eliquis.   Nicole [name] returns today for followup. Unfortunately she was just in the hospital with epistaxis. This is following a period of time where she developed small bowel obstruction and had a nasogastric tube in place. This could contribute to possible erosions. She was seen by an ear nose and throat specialist to placed a nasal balloon and ultimately packed her nose. This packing was in for a week and was removed and no further bleeding was noted. It was not mentioned what the cause of her bleeding was. She was on adequate. She still has a high CHADSVASC score. We had recommended discontinuing her eloquence due to bleeding. She was also in A. fib with RVR  and I recommended increasing her diltiazem to 240 mg daily but that was not performed. She's had no further epistaxis.  PMHx:   Past Medical History  Diagnosis Date  . Colon cancer 07/1997  . Hypertension   . Diabetes mellitus   . CHF (congestive heart failure)   . AAA (abdominal aortic aneurysm)   . Atrial fibrillation   . Hypercholesteremia   . Kidney stone     renal calculi  . Small bowel obstruction due to adhesions 05/16/2012  . Hypothyroidism   . Arthritis   . Anemia   . Cholelithiasis   . Perforation of bile duct   . Hemorrhage of rectum and anus   . Swelling, mass, or lump in head and neck   . Cervicalgia   . Hypoglycemia, unspecified   . Pain in joint, lower leg   . Anxiety   . Shortness of breath   . Acute upper respiratory infections of unspecified site   . Depression   . Thoracic aneurysm without mention of rupture   . GERD (gastroesophageal reflux disease)   . Proteinuria   . Other specified cardiac dysrhythmias(427.89)   . Congestive heart failure, unspecified   . Electrolyte and fluid disorders not elsewhere classified   . Other chronic allergic conjunctivitis   . Hypopotassemia   . Chronic systolic heart failure   . Dizziness and giddiness   . Malignant neoplasm of colon, unspecified site   . Other and unspecified hyperlipidemia   . Gout, unspecified   . Atrial fibrillation   .  Aortic aneurysm of unspecified site without mention of rupture   . Pain in joint, site unspecified   . Palpitations   . CKD (chronic kidney disease)     Past Surgical History  Procedure Laterality Date  . Hemicolectomy  08/11/1997  . Kidney stone surgery    . Colon surgery      to remove colon ca  . Cholecystectomy  05/14/2012    Procedure: LAPAROSCOPIC CHOLECYSTECTOMY WITH INTRAOPERATIVE CHOLANGIOGRAM;  Surgeon: Haywood Lasso, MD;  Location: [facility];  Service: General;  Laterality: N/A;  . Ercp  05/17/2012    Procedure: ENDOSCOPIC RETROGRADE  CHOLANGIOPANCREATOGRAPHY (ERCP);  Surgeon: Missy Sabins, MD;  Location: [facility];  Service: Gastroenterology;  Laterality: N/A;  . Ercp  08/12/2012    Procedure: ENDOSCOPIC RETROGRADE CHOLANGIOPANCREATOGRAPHY (ERCP);  Surgeon: Missy Sabins, MD;  Location: [facility] ENDOSCOPY;  Service: Endoscopy;  Laterality: N/A;  . Transthoracic echocardiogram  11/2009    EF 45-50%, mild-mod AV regurg; mild MR; mod TR; ascending aorta mildly dilated  . Cardiac catheterization  2008    moderate severe pulm HTN; with elevted pulm capillary wedge pressure     FAMHx:  Family History  Problem Relation Age of Onset  . Heart disease Mother   . Stroke Father   . Hypertension Daughter   . Hypertension Son   . Diabetes Sister   . Hypertension Son     SOCHx:   reports that she has never smoked. She has never used smokeless tobacco. She reports that she does not drink alcohol or use illicit drugs.  ALLERGIES:  Allergies  Allergen Reactions  . Iohexol      Code: VOM, Desc: PT REPORTS VOMITING W/ IVP DYE- ARS 12/26/08, Onset Date: [id number]   . Z-Pak [Azithromycin]   . Iodinated Diagnostic Agents Nausea Only    PT HAS HAD SINGULAR INCIDENT OF NAUSEA  W/ IV CONTYRAST INJECTION, NONE IF INJECTED SLOWLY//A.CALHOUN    ROS: A comprehensive review of systems was negative except for: Ears, nose, mouth, throat, and face: positive for epistaxis  HOME MEDS: Current Outpatient Prescriptions  Medication Sig Dispense Refill  . allopurinol (ZYLOPRIM) 100 MG tablet Take 100 mg by mouth daily.      . cephALEXin (KEFLEX) 500 MG capsule Take 1 capsule (500 mg total) by mouth 4 (four) times daily.  20 capsule  0  . diltiazem (CARTIA XT) 240 MG 24 hr capsule Take 1 capsule (240 mg total) by mouth daily.  30 capsule  9  . furosemide (LASIX) 40 MG tablet Take 40 mg by mouth 2 (two) times daily.      Marland Kitchen gabapentin (NEURONTIN) 100 MG capsule Take 100 mg by mouth 3 (three) times daily.      Marland Kitchen glipiZIDE (GLUCOTROL) 10 MG tablet Take 10 mg by  mouth daily before breakfast.      . HYDROcodone-acetaminophen (NORCO/VICODIN) 5-325 MG per tablet Take 0.5-1 tablets by mouth every 4 (four) hours as needed for moderate pain.  20 tablet  0  . levothyroxine (SYNTHROID, LEVOTHROID) 50 MCG tablet Take 1 tablet (50 mcg total) by mouth daily.  90 tablet  3  . magnesium oxide (MAG-OX) 400 MG tablet Take 400 mg by mouth 3 (three) times daily.      . metFORMIN (GLUCOPHAGE) 500 MG tablet Take 1 tablet (500 mg total) by mouth 2 (two) times daily with a meal.  60 tablet  5  . metoprolol (LOPRESSOR) 25 MG tablet Take 1 tablet (25 mg total) by mouth  2 (two) times daily.  60 tablet  3  . polyethylene glycol (MIRALAX / GLYCOLAX) packet Take 17 g by mouth daily as needed for mild constipation.       . potassium chloride SA (K-DUR,KLOR-CON) 20 MEQ tablet Take 20 mEq by mouth daily.      . simvastatin (ZOCOR) 40 MG tablet Take 40 mg by mouth daily.      Marland Kitchen warfarin (COUMADIN) 5 MG tablet Take 1 tablet (5 mg total) by mouth daily.  30 tablet  6   No current facility-administered medications for this visit.    LABS/IMAGING: No results found for this or any previous visit (from the past 48 hour(s)). No results found.  VITALS: BP 178/88  Pulse 92  Ht 5\' 8"  (1.727 m)  Wt 169 lb (76.658 kg)  BMI 25.70 kg/m2  EXAM: General appearance: alert and no distress Neck: no adenopathy, no carotid bruit, no JVD, supple, symmetrical, trachea midline and thyroid not enlarged, symmetric, no tenderness/mass/nodules Lungs: clear to auscultation bilaterally Heart: regular rate and rhythm, S1, S2 normal, no murmur, click, rub or gallop Abdomen: soft, non-tender; bowel sounds normal; no masses,  no organomegaly Extremities: extremities normal, atraumatic, no cyanosis or edema Pulses: 2+ and symmetric Skin: Skin color, texture, turgor normal. No rashes or lesions Neurologic: Grossly normal  EKG: deferred  ASSESSMENT: 1. Chronic atrial fibrillation - Eliquis on hold for  epistaxis 2. History of congestive heart failure, EF of 45% 3. Hypertension 4. History of thoracic aneurysm 5. Diabetes 6. Dyslipidemia 7. Chronic kidney disease  PLAN: 1.   Nicole [name] has chronic atrial fibrillation over recently has had rapid ventricular response. Her blood pressures higher than normal. I recommend increasing her diltiazem to 240 mg daily as I had recommended when she was recently in the hospital. I'm concerned about recurrent epistaxis on Eliquis and the inability to reverse this medication. I think she may do better on warfarin long term although will require monitoring. I would recommend starting warfarin today at 5 mg daily and we'll plan to have first see Nicole [name] in our anticoagulation pharmacist on Friday for an INR check. I've also recommended that she use nasal saline sprays to keep her nasal mucosa moist. Plan to see her back in 3-6 months.  Pixie Casino, MD, [facility] Attending Cardiologist [facility] 02/23/2014, 1:32 PM

## 2014-02-25 NOTE — Telephone Encounter (Signed)
Closed enounter °

## 2014-02-26 ENCOUNTER — Ambulatory Visit (INDEPENDENT_AMBULATORY_CARE_PROVIDER_SITE_OTHER): Payer: Medicare Other | Admitting: Pharmacist Clinician (PhC)/ Clinical Pharmacy Specialist

## 2014-02-26 DIAGNOSIS — Z7901 Long term (current) use of anticoagulants: Secondary | ICD-10-CM

## 2014-02-26 DIAGNOSIS — I4891 Unspecified atrial fibrillation: Secondary | ICD-10-CM

## 2014-02-26 LAB — POCT INR: INR: 1.1

## 2014-02-26 NOTE — Progress Notes (Signed)
New start, reviewed MOA, dosing, safety information, food/drug interactions.

## 2014-03-05 ENCOUNTER — Ambulatory Visit (INDEPENDENT_AMBULATORY_CARE_PROVIDER_SITE_OTHER): Payer: Medicare Other | Admitting: Pharmacist Clinician (PhC)/ Clinical Pharmacy Specialist

## 2014-03-05 DIAGNOSIS — I4891 Unspecified atrial fibrillation: Secondary | ICD-10-CM

## 2014-03-05 DIAGNOSIS — Z7901 Long term (current) use of anticoagulants: Secondary | ICD-10-CM

## 2014-03-05 LAB — POCT INR: INR: 2.5

## 2014-03-12 ENCOUNTER — Ambulatory Visit (INDEPENDENT_AMBULATORY_CARE_PROVIDER_SITE_OTHER): Payer: Medicare Other | Admitting: Pharmacist Clinician (PhC)/ Clinical Pharmacy Specialist

## 2014-03-12 DIAGNOSIS — I4891 Unspecified atrial fibrillation: Secondary | ICD-10-CM

## 2014-03-12 DIAGNOSIS — Z7901 Long term (current) use of anticoagulants: Secondary | ICD-10-CM

## 2014-03-12 LAB — POCT INR: INR: 5.4

## 2014-03-19 ENCOUNTER — Ambulatory Visit (INDEPENDENT_AMBULATORY_CARE_PROVIDER_SITE_OTHER): Payer: Medicare Other | Admitting: Pharmacist Clinician (PhC)/ Clinical Pharmacy Specialist

## 2014-03-19 DIAGNOSIS — I4891 Unspecified atrial fibrillation: Secondary | ICD-10-CM

## 2014-03-19 DIAGNOSIS — Z7901 Long term (current) use of anticoagulants: Secondary | ICD-10-CM

## 2014-03-19 LAB — POCT INR: INR: 4.6

## 2014-03-31 ENCOUNTER — Ambulatory Visit (INDEPENDENT_AMBULATORY_CARE_PROVIDER_SITE_OTHER): Payer: Medicare Other | Admitting: Pharmacist Clinician (PhC)/ Clinical Pharmacy Specialist

## 2014-03-31 DIAGNOSIS — I4891 Unspecified atrial fibrillation: Secondary | ICD-10-CM

## 2014-03-31 DIAGNOSIS — Z7901 Long term (current) use of anticoagulants: Secondary | ICD-10-CM

## 2014-03-31 LAB — POCT INR: INR: 2.1

## 2014-04-02 ENCOUNTER — Inpatient Hospital Stay (HOSPITAL_COMMUNITY)
Admission: EM | Admit: 2014-04-02 | Discharge: 2014-04-08 | DRG: 389 | Disposition: A | Discharge status: Home Health | Payer: Medicare Other | Attending: Internal Medicine | Admitting: Internal Medicine

## 2014-04-02 ENCOUNTER — Encounter (HOSPITAL_COMMUNITY): Payer: Self-pay | Admitting: Emergency Medicine

## 2014-04-02 DIAGNOSIS — E039 Hypothyroidism, unspecified: Secondary | ICD-10-CM | POA: Diagnosis present

## 2014-04-02 DIAGNOSIS — E7800 Pure hypercholesterolemia, unspecified: Secondary | ICD-10-CM | POA: Diagnosis present

## 2014-04-02 DIAGNOSIS — F3289 Other specified depressive episodes: Secondary | ICD-10-CM | POA: Diagnosis present

## 2014-04-02 DIAGNOSIS — R1011 Right upper quadrant pain: Secondary | ICD-10-CM

## 2014-04-02 DIAGNOSIS — Z79899 Other long term (current) drug therapy: Secondary | ICD-10-CM

## 2014-04-02 DIAGNOSIS — I4891 Unspecified atrial fibrillation: Secondary | ICD-10-CM

## 2014-04-02 DIAGNOSIS — R0602 Shortness of breath: Secondary | ICD-10-CM | POA: Diagnosis present

## 2014-04-02 DIAGNOSIS — Z85038 Personal history of other malignant neoplasm of large intestine: Secondary | ICD-10-CM | POA: Diagnosis present

## 2014-04-02 DIAGNOSIS — E038 Other specified hypothyroidism: Secondary | ICD-10-CM

## 2014-04-02 DIAGNOSIS — I509 Heart failure, unspecified: Secondary | ICD-10-CM | POA: Diagnosis present

## 2014-04-02 DIAGNOSIS — I10 Essential (primary) hypertension: Secondary | ICD-10-CM | POA: Diagnosis present

## 2014-04-02 DIAGNOSIS — I482 Chronic atrial fibrillation, unspecified: Secondary | ICD-10-CM

## 2014-04-02 DIAGNOSIS — K565 Intestinal adhesions [bands], unspecified as to partial versus complete obstruction: Secondary | ICD-10-CM

## 2014-04-02 DIAGNOSIS — I428 Other cardiomyopathies: Secondary | ICD-10-CM | POA: Diagnosis present

## 2014-04-02 DIAGNOSIS — E785 Hyperlipidemia, unspecified: Secondary | ICD-10-CM | POA: Diagnosis present

## 2014-04-02 DIAGNOSIS — F329 Major depressive disorder, single episode, unspecified: Secondary | ICD-10-CM | POA: Diagnosis present

## 2014-04-02 DIAGNOSIS — I5022 Chronic systolic (congestive) heart failure: Secondary | ICD-10-CM | POA: Diagnosis present

## 2014-04-02 DIAGNOSIS — I714 Abdominal aortic aneurysm, without rupture, unspecified: Secondary | ICD-10-CM | POA: Diagnosis present

## 2014-04-02 DIAGNOSIS — Z7901 Long term (current) use of anticoagulants: Secondary | ICD-10-CM

## 2014-04-02 DIAGNOSIS — E1149 Type 2 diabetes mellitus with other diabetic neurological complication: Secondary | ICD-10-CM | POA: Diagnosis present

## 2014-04-02 DIAGNOSIS — K56600 Partial intestinal obstruction, unspecified as to cause: Secondary | ICD-10-CM

## 2014-04-02 DIAGNOSIS — K56609 Unspecified intestinal obstruction, unspecified as to partial versus complete obstruction: Secondary | ICD-10-CM | POA: Diagnosis not present

## 2014-04-02 DIAGNOSIS — F411 Generalized anxiety disorder: Secondary | ICD-10-CM | POA: Diagnosis present

## 2014-04-02 DIAGNOSIS — M129 Arthropathy, unspecified: Secondary | ICD-10-CM | POA: Diagnosis present

## 2014-04-02 DIAGNOSIS — N189 Chronic kidney disease, unspecified: Secondary | ICD-10-CM | POA: Diagnosis present

## 2014-04-02 DIAGNOSIS — R112 Nausea with vomiting, unspecified: Secondary | ICD-10-CM | POA: Diagnosis not present

## 2014-04-02 DIAGNOSIS — I129 Hypertensive chronic kidney disease with stage 1 through stage 4 chronic kidney disease, or unspecified chronic kidney disease: Secondary | ICD-10-CM | POA: Diagnosis present

## 2014-04-02 DIAGNOSIS — R791 Abnormal coagulation profile: Secondary | ICD-10-CM | POA: Diagnosis present

## 2014-04-02 DIAGNOSIS — M109 Gout, unspecified: Secondary | ICD-10-CM | POA: Diagnosis present

## 2014-04-02 DIAGNOSIS — K219 Gastro-esophageal reflux disease without esophagitis: Secondary | ICD-10-CM | POA: Diagnosis present

## 2014-04-02 LAB — CBC WITH DIFFERENTIAL/PLATELET
BASOS ABS: 0 10*3/uL (ref 0.0–0.1)
BASOS PCT: 0 % (ref 0–1)
EOS ABS: 0 10*3/uL (ref 0.0–0.7)
Eosinophils Relative: 0 % (ref 0–5)
HCT: 35 % — ABNORMAL LOW (ref 36.0–46.0)
Hemoglobin: 12.2 g/dL (ref 12.0–15.0)
Lymphocytes Relative: 12 % (ref 12–46)
Lymphs Abs: 1.4 10*3/uL (ref 0.7–4.0)
MCH: 27.3 pg (ref 26.0–34.0)
MCHC: 34.9 g/dL (ref 30.0–36.0)
MCV: 78.3 fL (ref 78.0–100.0)
MONOS PCT: 8 % (ref 3–12)
Monocytes Absolute: 0.9 10*3/uL (ref 0.1–1.0)
NEUTROS ABS: 9.2 10*3/uL — AB (ref 1.7–7.7)
Neutrophils Relative %: 80 % — ABNORMAL HIGH (ref 43–77)
Platelets: 335 10*3/uL (ref 150–400)
RBC: 4.47 MIL/uL (ref 3.87–5.11)
RDW: 15.8 % — AB (ref 11.5–15.5)
WBC: 11.6 10*3/uL — ABNORMAL HIGH (ref 4.0–10.5)

## 2014-04-02 LAB — URINE MICROSCOPIC-ADD ON

## 2014-04-02 LAB — URINALYSIS, ROUTINE W REFLEX MICROSCOPIC
Bilirubin Urine: NEGATIVE
Glucose, UA: NEGATIVE mg/dL
Ketones, ur: 15 mg/dL — AB
Leukocytes, UA: NEGATIVE
NITRITE: NEGATIVE
PH: 8 (ref 5.0–8.0)
Protein, ur: 100 mg/dL — AB
SPECIFIC GRAVITY, URINE: 1.014 (ref 1.005–1.030)
Urobilinogen, UA: 1 mg/dL (ref 0.0–1.0)

## 2014-04-02 LAB — I-STAT TROPONIN, ED: Troponin i, poc: 0 ng/mL (ref 0.00–0.08)

## 2014-04-02 LAB — PROTIME-INR
INR: 2.15 — ABNORMAL HIGH (ref 0.00–1.49)
PROTHROMBIN TIME: 24 s — AB (ref 11.6–15.2)

## 2014-04-02 LAB — COMPREHENSIVE METABOLIC PANEL
ALBUMIN: 3.8 g/dL (ref 3.5–5.2)
ALT: 12 U/L (ref 0–35)
ANION GAP: 15 (ref 5–15)
AST: 16 U/L (ref 0–37)
Alkaline Phosphatase: 109 U/L (ref 39–117)
BILIRUBIN TOTAL: 0.5 mg/dL (ref 0.3–1.2)
BUN: 19 mg/dL (ref 6–23)
CHLORIDE: 99 meq/L (ref 96–112)
CO2: 27 mEq/L (ref 19–32)
Calcium: 9.5 mg/dL (ref 8.4–10.5)
Creatinine, Ser: 1.41 mg/dL — ABNORMAL HIGH (ref 0.50–1.10)
GFR calc Af Amer: 39 mL/min — ABNORMAL LOW (ref >90)
GFR calc non Af Amer: 34 mL/min — ABNORMAL LOW (ref >90)
Glucose, Bld: 159 mg/dL — ABNORMAL HIGH (ref 70–99)
Potassium: 4 mEq/L (ref 3.7–5.3)
Sodium: 141 mEq/L (ref 137–147)
TOTAL PROTEIN: 7.6 g/dL (ref 6.0–8.3)

## 2014-04-02 LAB — LIPASE, BLOOD: LIPASE: 47 U/L (ref 11–59)

## 2014-04-02 IMAGING — US US ABDOMEN COMPLETE
1 series · 13 of 25 positions shown · non-contrast
Comparison: CT 11/08/2006

CLINICAL DATA: Upper abdominal pain.  Concern for gallstones.
History of splenectomy.

COMPLETE ABDOMINAL ULTRASOUND

[Series 1: us abdomen complete · 0.25mm/px · 13 of 85 slices shown]
[im 1/85]
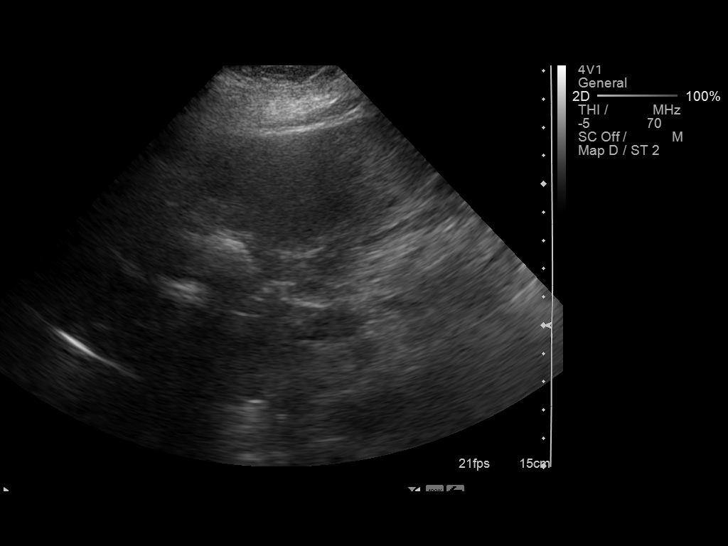
[im 8/85]
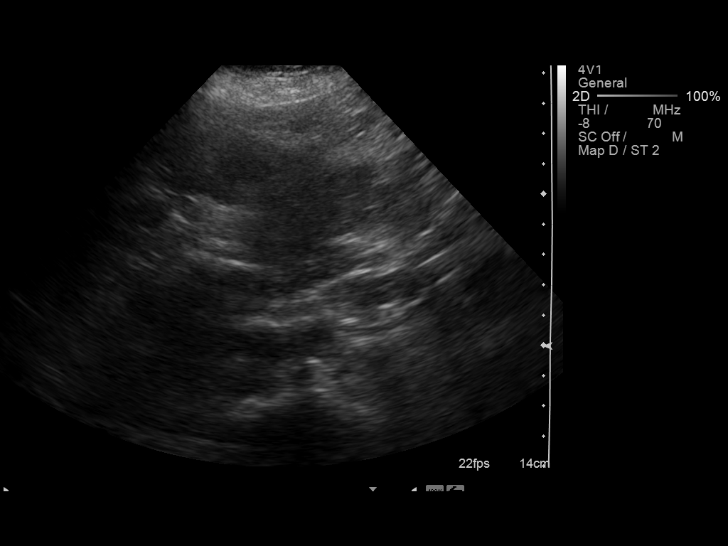
[im 15/85]
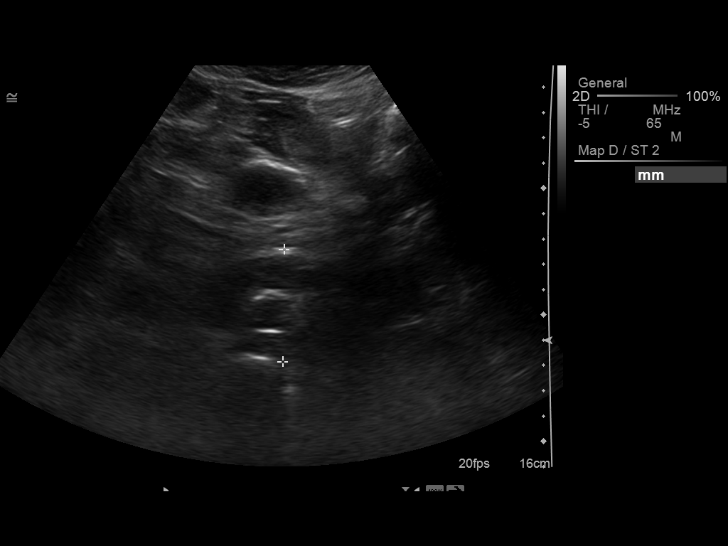
[im 22/85]
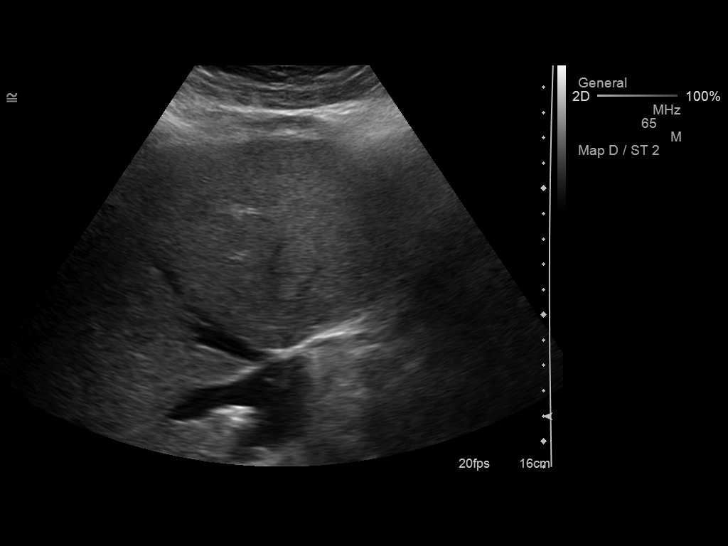
[im 29/85]
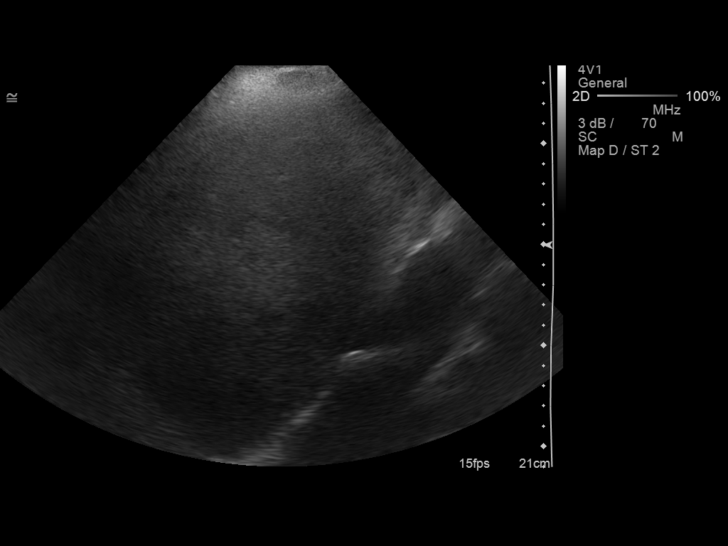
[im 36/85]
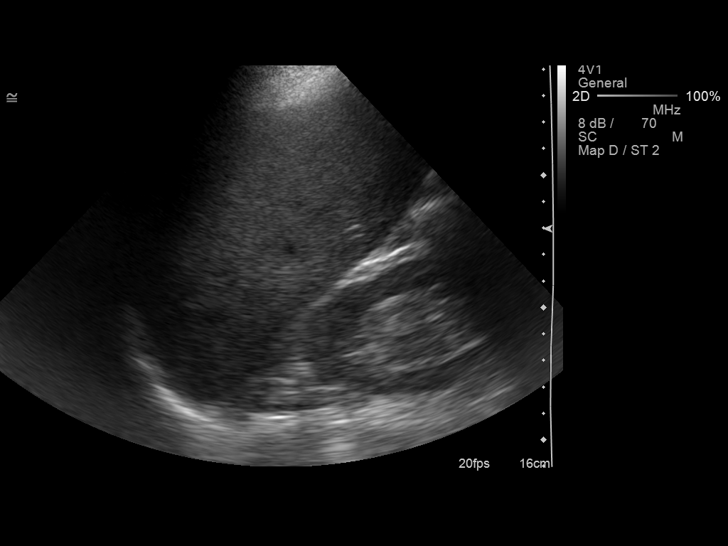
[im 43/85]
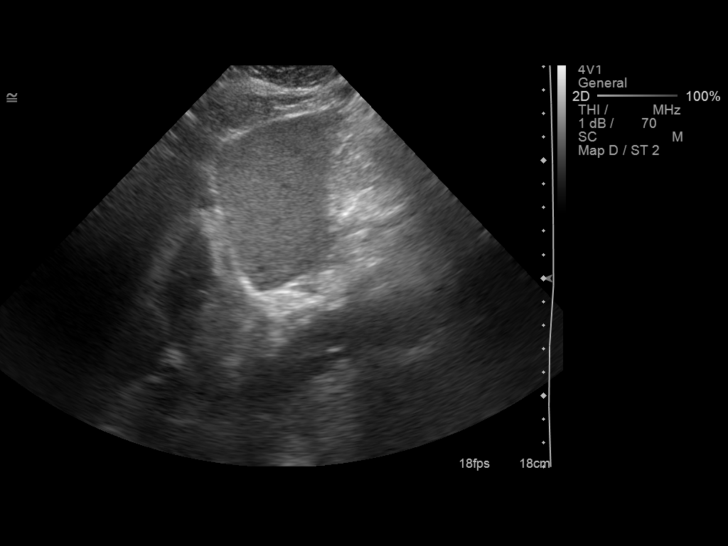
[im 50/85]
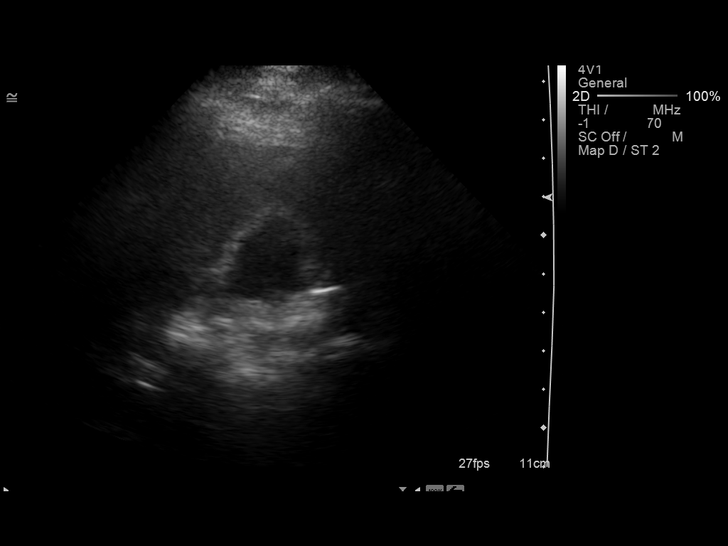
[im 57/85]
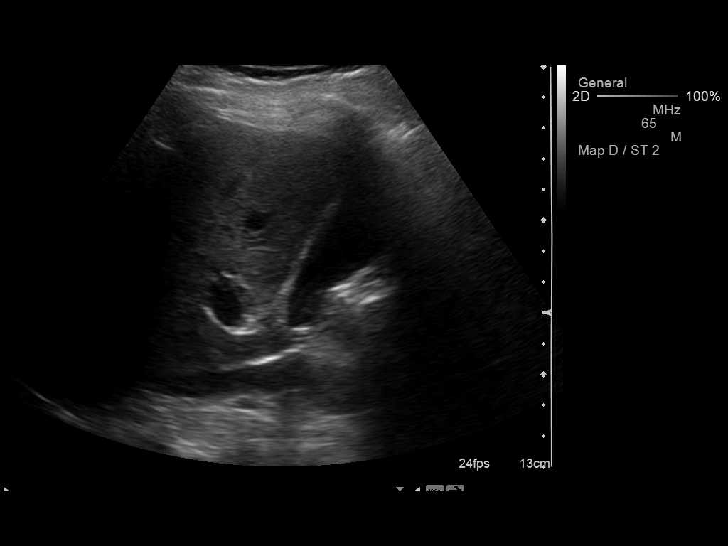
[im 64/85]
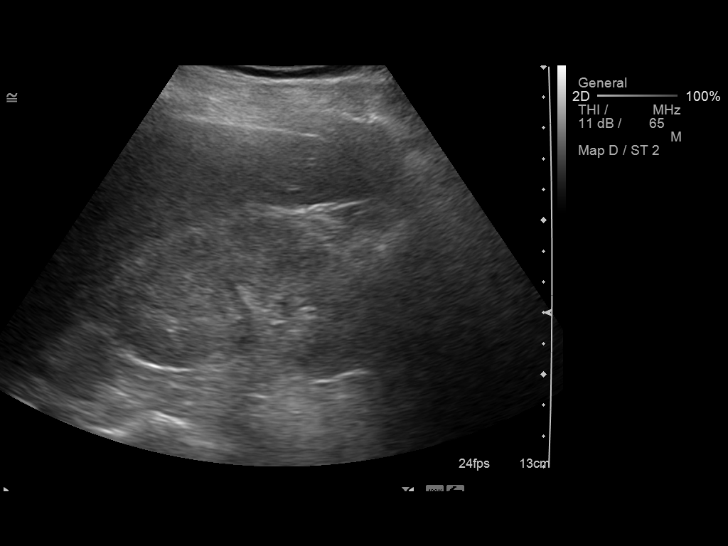
[im 71/85]
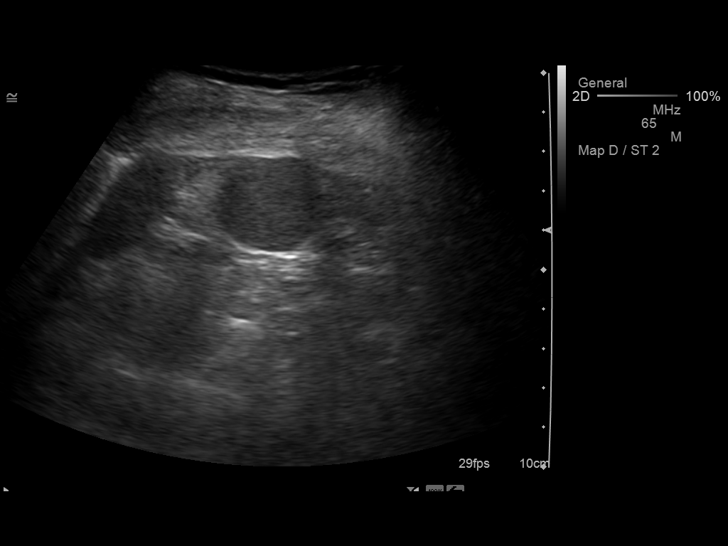
[im 78/85]
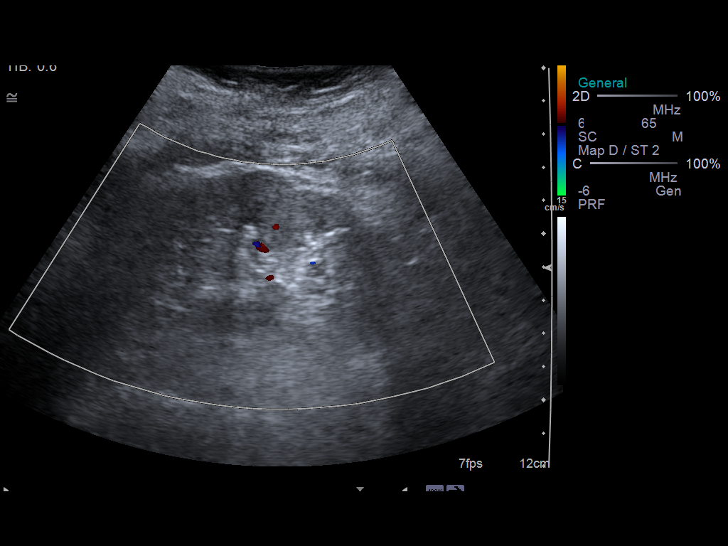
[im 85/85]
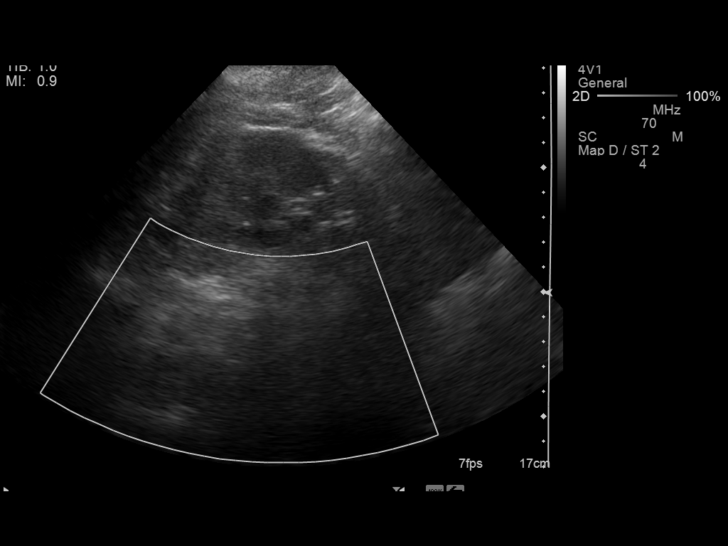

[13 of 25 positions shown; findings below may reference images not displayed]

FINDINGS: Gallbladder:  There is a 0.9 cm stone near the gallbladder neck.
There is no evidence for gallbladder wall thickening.  The patient
does not have a sonographic Murphy's sign.

Common bile duct:  Measures 0.5 cm.

Liver:  The liver is echogenic and slightly heterogeneous.

IVC:  Appears normal.

Pancreas:  Poorly visualized due to bowel gas.

Spleen:  The patient had a splenectomy but there is a round
structure in the left upper quadrant consistent with a splenule.
Splenule measures up to 2.4 cm and this was present on the prior
examination.

Right Kidney:  Right kidney measures 10.1 cm in length without
hydronephrosis.

Left Kidney:  Left kidney is difficult to evaluate and roughly
measures 9.6 cm in length without hydronephrosis.

Abdominal aorta: Retroperitoneal structures are difficult to
evaluate.  Cannot exclude aneurysmal dilatation of the mid
abdominal aorta.
IMPRESSION: There is a 0.9 cm gallstone near the gallbladder neck.

No evidence for biliary dilatation.

Limited evaluation of the abdominal aorta.  There is concern for
aneurysmal dilatation in the mid abdominal aorta.  This could be
better evaluated with CT.

## 2014-04-02 MED ORDER — ONDANSETRON 4 MG PO TBDP
8.0000 mg | ORAL_TABLET | Freq: Once | ORAL | Status: AC
  Administered 2014-04-02: 8 mg via ORAL
  Filled 2014-04-02: qty 2

## 2014-04-02 MED ORDER — ONDANSETRON HCL 4 MG/2ML IJ SOLN
4.0000 mg | Freq: Once | INTRAMUSCULAR | Status: AC
  Administered 2014-04-02: 4 mg via INTRAVENOUS
  Filled 2014-04-02: qty 2

## 2014-04-02 MED ORDER — MORPHINE SULFATE 4 MG/ML IJ SOLN
4.0000 mg | Freq: Once | INTRAMUSCULAR | Status: AC
  Administered 2014-04-02: 4 mg via INTRAVENOUS
  Filled 2014-04-02: qty 1

## 2014-04-02 MED ORDER — OXYCODONE-ACETAMINOPHEN 5-325 MG PO TABS
1.0000 | ORAL_TABLET | Freq: Once | ORAL | Status: AC
  Administered 2014-04-02: 1 via ORAL
  Filled 2014-04-02: qty 1

## 2014-04-02 NOTE — ED Provider Notes (Signed)
CSN: [id number]     Arrival date & time 04/02/14  2149 History   First MD Initiated Contact with Patient 04/02/14 2302     Chief Complaint  Patient presents with  . Abdominal Pain     (Consider location/radiation/quality/duration/timing/severity/associated sxs/prior Treatment) Patient is a 80 y.o. female presenting with abdominal pain. The history is provided by the patient and medical records.  Abdominal Pain Associated symptoms: nausea and vomiting    This is an 80 y.o. F with PMH significnat for HTN, DM, CHF, AFIB on coumadin, AAA, colon CA, presenting to the ED for abdominal pain.  Patient states over the past several days she has had several episodes of nausea and non-bloody, non-bilious emesis without known cause.  States she has had some mild generalized abdominal pain which has significantly worsened over the past several hours.  Pain mostly localized to her epigastrium and peri-umbilical regions.  States she is still able to pass flatus.  Has had small BMs over the past few days.  Denies fever, chills, sweats, urinary sx.  Prior abdominal surgeries include cholecystectomy, hemicolectomy,  ERCP x2.   Hx of recurrent SBO, most recent in July 2015.   Past Medical History  Diagnosis Date  . Colon cancer 07/1997  . Hypertension   . Diabetes mellitus   . CHF (congestive heart failure)   . AAA (abdominal aortic aneurysm)   . Atrial fibrillation   . Hypercholesteremia   . Kidney stone     renal calculi  . Small bowel obstruction due to adhesions 05/16/2012  . Hypothyroidism   . Arthritis   . Anemia   . Cholelithiasis   . Perforation of bile duct   . Hemorrhage of rectum and anus   . Swelling, mass, or lump in head and neck   . Cervicalgia   . Hypoglycemia, unspecified   . Pain in joint, lower leg   . Anxiety   . Shortness of breath   . Acute upper respiratory infections of unspecified site   . Depression   . Thoracic aneurysm without mention of rupture   . GERD  (gastroesophageal reflux disease)   . Proteinuria   . Other specified cardiac dysrhythmias(427.89)   . Congestive heart failure, unspecified   . Electrolyte and fluid disorders not elsewhere classified   . Other chronic allergic conjunctivitis   . Hypopotassemia   . Chronic systolic heart failure   . Dizziness and giddiness   . Malignant neoplasm of colon, unspecified site   . Other and unspecified hyperlipidemia   . Gout, unspecified   . Atrial fibrillation   . Aortic aneurysm of unspecified site without mention of rupture   . Pain in joint, site unspecified   . Palpitations   . CKD (chronic kidney disease)    Past Surgical History  Procedure Laterality Date  . Hemicolectomy  08/11/1997  . Kidney stone surgery    . Colon surgery      to remove colon ca  . Cholecystectomy  05/14/2012    Procedure: LAPAROSCOPIC CHOLECYSTECTOMY WITH INTRAOPERATIVE CHOLANGIOGRAM;  Surgeon: Haywood Lasso, MD;  Location: [place];  Service: General;  Laterality: N/A;  . Ercp  05/17/2012    Procedure: ENDOSCOPIC RETROGRADE CHOLANGIOPANCREATOGRAPHY (ERCP);  Surgeon: Missy Sabins, MD;  Location: [place];  Service: Gastroenterology;  Laterality: N/A;  . Ercp  08/12/2012    Procedure: ENDOSCOPIC RETROGRADE CHOLANGIOPANCREATOGRAPHY (ERCP);  Surgeon: Missy Sabins, MD;  Location: [facility] ENDOSCOPY;  Service: Endoscopy;  Laterality: N/A;  . Transthoracic echocardiogram  11/2009    EF 45-50%, mild-mod AV regurg; mild MR; mod TR; ascending aorta mildly dilated  . Cardiac catheterization  2008    moderate severe pulm HTN; with elevted pulm capillary wedge pressure    Family History  Problem Relation Age of Onset  . Heart disease Mother   . Stroke Father   . Hypertension Daughter   . Hypertension Son   . Diabetes Sister   . Hypertension Son    History  Substance Use Topics  . Smoking status: Never Smoker   . Smokeless tobacco: Never Used  . Alcohol Use: No   OB History   Grav Para Term Preterm Abortions TAB  SAB Ect Mult Living                 Review of Systems  Gastrointestinal: Positive for nausea, vomiting and abdominal pain.  All other systems reviewed and are negative.     Allergies  Iohexol; Z-pak; and Iodinated diagnostic agents  Home Medications   Prior to Admission medications   Medication Sig Start Date End Date Taking? Authorizing Provider  allopurinol (ZYLOPRIM) 100 MG tablet Take 100 mg by mouth daily.    Historical Provider, MD  cephALEXin (KEFLEX) 500 MG capsule Take 1 capsule (500 mg total) by mouth 4 (four) times daily. 02/15/14   Orpah Greek, MD  diltiazem (CARTIA XT) 240 MG 24 hr capsule Take 1 capsule (240 mg total) by mouth daily. 02/23/14   Pixie Casino, MD  furosemide (LASIX) 40 MG tablet Take 40 mg by mouth 2 (two) times daily.    Historical Provider, MD  gabapentin (NEURONTIN) 100 MG capsule Take 100 mg by mouth 3 (three) times daily.    Historical Provider, MD  glipiZIDE (GLUCOTROL) 10 MG tablet Take 10 mg by mouth daily before breakfast.    Historical Provider, MD  HYDROcodone-acetaminophen (NORCO/VICODIN) 5-325 MG per tablet Take 0.5-1 tablets by mouth every 4 (four) hours as needed for moderate pain. 02/15/14   Orpah Greek, MD  levothyroxine (SYNTHROID, LEVOTHROID) 50 MCG tablet Take 1 tablet (50 mcg total) by mouth daily. 07/01/13   Pricilla Larsson, NP  magnesium oxide (MAG-OX) 400 MG tablet Take 400 mg by mouth 3 (three) times daily.    Historical Provider, MD  metFORMIN (GLUCOPHAGE) 500 MG tablet Take 1 tablet (500 mg total) by mouth 2 (two) times daily with a meal. 01/18/14   [name] [name], DO  metoprolol (LOPRESSOR) 25 MG tablet Take 1 tablet (25 mg total) by mouth 2 (two) times daily. 02/15/14   Orpah Greek, MD  polyethylene glycol [facility] / Floria Raveling) packet Take 17 g by mouth daily as needed for mild constipation.     Historical Provider, MD  potassium chloride SA (K-DUR,KLOR-CON) 20 MEQ tablet Take 20 mEq by mouth daily.     Historical Provider, MD  simvastatin (ZOCOR) 40 MG tablet Take 40 mg by mouth daily.    Historical Provider, MD  warfarin (COUMADIN) 5 MG tablet Take 1 tablet (5 mg total) by mouth daily. 02/23/14   Pixie Casino, MD   BP 192/114  Pulse 108  Temp(Src) 97.5 F (36.4 C)  Resp 20  Ht 5\' 9"  (1.753 m)  Wt 177 lb (80.287 kg)  BMI 26.13 kg/m2  SpO2 98%  Physical Exam  Nursing note and vitals reviewed. Constitutional: She is oriented to person, place, and time. She appears well-developed and well-nourished.  HENT:  Head: Normocephalic and atraumatic.  Mouth/Throat: Oropharynx is clear  and moist.  Eyes: Conjunctivae and EOM are normal. Pupils are equal, round, and reactive to light.  Neck: Normal range of motion.  Cardiovascular: Normal rate and normal heart sounds.  An irregularly irregular rhythm present.  AFIB  Pulmonary/Chest: Effort normal and breath sounds normal. No respiratory distress. She has no wheezes.  Abdominal: Soft. Bowel sounds are normal. There is tenderness in the epigastric area and periumbilical area. There is no rigidity, no guarding and no CVA tenderness.  Well healed mid-line surgical incision; tenderness of epigastrium and peri-umbilical region without rebound or guarding  Musculoskeletal: Normal range of motion.  Neurological: She is alert and oriented to person, place, and time.  Skin: Skin is warm and dry.  Psychiatric: She has a normal mood and affect.    ED Course  Procedures (including critical care time) Labs Review Labs Reviewed  CBC WITH DIFFERENTIAL - Abnormal; Notable for the following:    WBC 11.6 (*)    HCT 35.0 (*)    RDW 15.8 (*)    Neutrophils Relative % 80 (*)    Neutro Abs 9.2 (*)    All other components within normal limits  COMPREHENSIVE METABOLIC PANEL - Abnormal; Notable for the following:    Glucose, Bld 159 (*)    Creatinine, Ser 1.41 (*)    GFR calc non Af Amer 34 (*)    GFR calc Af Amer 39 (*)    All other components within  normal limits  URINALYSIS, ROUTINE W REFLEX MICROSCOPIC - Abnormal; Notable for the following:    APPearance CLOUDY (*)    Hgb urine dipstick SMALL (*)    Ketones, ur 15 (*)    Protein, ur 100 (*)    All other components within normal limits  URINE MICROSCOPIC-ADD ON - Abnormal; Notable for the following:    Bacteria, UA FEW (*)    All other components within normal limits  LIPASE, BLOOD  PROTIME-INR  I-STAT TROPOININ, ED    Imaging Review Ct Abdomen Pelvis Wo Contrast  04/03/2014   CLINICAL DATA:  Severe abdominal pain, nausea and vomiting.  EXAM: CT ABDOMEN AND PELVIS WITHOUT CONTRAST  TECHNIQUE: Multidetector CT imaging of the abdomen and pelvis was performed following the standard protocol without IV contrast.  COMPARISON:  CT of the abdomen and pelvis from 01/23/2014  FINDINGS: Minimal bibasilar atelectasis is noted.  The liver is unremarkable in appearance. Splenosis is noted at the left upper quadrant. The patient is status post cholecystectomy, with clips noted along the gallbladder fossa. The pancreas and adrenal glands are unremarkable.  Mild bilateral renal atrophy and left renal scarring are noted. The kidneys are otherwise unremarkable. Mild nonspecific perinephric stranding is noted bilaterally. There is no evidence of hydronephrosis. No renal or ureteral stones are seen.  There is distention of small-bowel loops to 4.1 cm in maximal diameter, with gradual dilution of contrast to the level of an apparent transition point at the mid ileum at the left lower quadrant. There is fecalization of the ileum just proximal to the transition point. This raises question for partial small bowel obstruction, possibly due to an adhesion.  More distal small bowel loops are mostly decompressed, though trace air is still seen within distal small bowel loops. No free fluid is identified. The stomach is within normal limits. No acute vascular abnormalities are seen. Scattered calcification is seen along  the abdominal aorta and its branches.  The appendix is normal in caliber and contains air, without evidence for appendicitis. Scattered diverticulosis is  noted along the ascending and proximal transverse colon. A bowel suture line is noted along the descending colon. Minimal diverticulosis is seen along the sigmoid colon. The colon is otherwise unremarkable.  The bladder is mildly distended and grossly unremarkable. The patient is status post hysterectomy. No suspicious adnexal masses are seen. The ovaries are relatively symmetric. No inguinal lymphadenopathy is seen.  No acute osseous abnormalities are identified.  IMPRESSION: 1. Distention of small-bowel loops to 4.1 cm in maximal diameter, with gradual dilution of contrast and an apparent transition point at the mid ileum at the left lower quadrant. There is fecalization of the ileum just proximal to the transition point, raising concern for partial small-bowel obstruction, possibly due to an adhesion. More distal small bowel loops are mostly decompressed, though trace air is still seen within distal small bowel loops. 2. Scattered calcification along the abdominal aorta and its branches. 3. Scattered diverticulosis along the ascending and proximal transverse colon. Minimal diverticulosis along the sigmoid colon. 4. Splenosis noted at the left upper quadrant. 5. Mild bilateral renal atrophy and left renal scarring noted.   Electronically Signed   By: Garald Balding M.D.   On: 04/03/2014 02:44     EKG Interpretation   Date/Time:  Friday April 02 2014 23:23:00 EDT Ventricular Rate:  115 PR Interval:    QRS Duration: 106 QT Interval:  393 QTC Calculation: 544 R Axis:   22 Text Interpretation:  Atrial fibrillation RSR' in V1 or V2, right VCD or  RVH Repol abnrm, severe global ischemia (LM/MVD) Prolonged QT interval No  significant change since last tracing Confirmed by Christy Gentles  MD, DONALD  [phone]) on 04/02/2014 11:39:04 PM      MDM   Final  diagnoses:  Partial small bowel obstruction  Atrial fibrillation, unspecified  Anticoagulated on Coumadin   80 y.o. F with extensive abdominal hx presenting to the ED for abdominal pain.  Some N/V a few days ago and one episode PTA.  Hx of SBO with similar sx.  Lab work with mild leukocytosis.  Will obtain CT abdomen pelvis for further evaluation.  Patient in AFIB, hx of same.  Anti-coagulated with coumadin.  Work-up pending.  Lab work with mild leukocytosis.  INR therapeutic.  CT revealing partial SBO.  Patient has returned from CT and began vomiting again.  Zofran given.  Once nausea controlled with attempt to place NG tube.  General surgery, Dr. Brantley Stage, notified and will consult on case.  Patient with multiple chronic medical problems, will be admitted to hospitalist service.  Larene Pickett, PA-C 04/03/14 [phone]

## 2014-04-02 NOTE — ED Notes (Signed)
Last bm today was normal

## 2014-04-02 NOTE — ED Notes (Signed)
C/o severe abd pain 4 hours ago.  With nv.  She last vomited one hour ago.  She had surgery in July for a blockage

## 2014-04-03 ENCOUNTER — Emergency Department (HOSPITAL_COMMUNITY): Payer: Medicare Other

## 2014-04-03 ENCOUNTER — Inpatient Hospital Stay (HOSPITAL_COMMUNITY): Payer: Medicare Other

## 2014-04-03 DIAGNOSIS — I509 Heart failure, unspecified: Secondary | ICD-10-CM | POA: Diagnosis present

## 2014-04-03 DIAGNOSIS — E785 Hyperlipidemia, unspecified: Secondary | ICD-10-CM | POA: Diagnosis present

## 2014-04-03 DIAGNOSIS — K219 Gastro-esophageal reflux disease without esophagitis: Secondary | ICD-10-CM | POA: Diagnosis present

## 2014-04-03 DIAGNOSIS — M129 Arthropathy, unspecified: Secondary | ICD-10-CM | POA: Diagnosis present

## 2014-04-03 DIAGNOSIS — I129 Hypertensive chronic kidney disease with stage 1 through stage 4 chronic kidney disease, or unspecified chronic kidney disease: Secondary | ICD-10-CM | POA: Diagnosis present

## 2014-04-03 DIAGNOSIS — F3289 Other specified depressive episodes: Secondary | ICD-10-CM | POA: Diagnosis present

## 2014-04-03 DIAGNOSIS — E1149 Type 2 diabetes mellitus with other diabetic neurological complication: Secondary | ICD-10-CM | POA: Diagnosis present

## 2014-04-03 DIAGNOSIS — I4891 Unspecified atrial fibrillation: Secondary | ICD-10-CM | POA: Diagnosis present

## 2014-04-03 DIAGNOSIS — N189 Chronic kidney disease, unspecified: Secondary | ICD-10-CM | POA: Diagnosis present

## 2014-04-03 DIAGNOSIS — K56609 Unspecified intestinal obstruction, unspecified as to partial versus complete obstruction: Secondary | ICD-10-CM | POA: Diagnosis present

## 2014-04-03 DIAGNOSIS — I428 Other cardiomyopathies: Secondary | ICD-10-CM | POA: Diagnosis present

## 2014-04-03 DIAGNOSIS — R0602 Shortness of breath: Secondary | ICD-10-CM | POA: Diagnosis present

## 2014-04-03 DIAGNOSIS — R791 Abnormal coagulation profile: Secondary | ICD-10-CM | POA: Diagnosis present

## 2014-04-03 DIAGNOSIS — I714 Abdominal aortic aneurysm, without rupture, unspecified: Secondary | ICD-10-CM | POA: Diagnosis present

## 2014-04-03 DIAGNOSIS — E038 Other specified hypothyroidism: Secondary | ICD-10-CM

## 2014-04-03 DIAGNOSIS — R112 Nausea with vomiting, unspecified: Secondary | ICD-10-CM | POA: Diagnosis present

## 2014-04-03 DIAGNOSIS — Z85038 Personal history of other malignant neoplasm of large intestine: Secondary | ICD-10-CM | POA: Diagnosis not present

## 2014-04-03 DIAGNOSIS — Z79899 Other long term (current) drug therapy: Secondary | ICD-10-CM | POA: Diagnosis not present

## 2014-04-03 DIAGNOSIS — I5022 Chronic systolic (congestive) heart failure: Secondary | ICD-10-CM | POA: Diagnosis present

## 2014-04-03 DIAGNOSIS — M109 Gout, unspecified: Secondary | ICD-10-CM | POA: Diagnosis present

## 2014-04-03 DIAGNOSIS — E039 Hypothyroidism, unspecified: Secondary | ICD-10-CM | POA: Diagnosis present

## 2014-04-03 DIAGNOSIS — F411 Generalized anxiety disorder: Secondary | ICD-10-CM | POA: Diagnosis present

## 2014-04-03 DIAGNOSIS — Z7901 Long term (current) use of anticoagulants: Secondary | ICD-10-CM | POA: Diagnosis not present

## 2014-04-03 DIAGNOSIS — F329 Major depressive disorder, single episode, unspecified: Secondary | ICD-10-CM | POA: Diagnosis present

## 2014-04-03 DIAGNOSIS — E7800 Pure hypercholesterolemia, unspecified: Secondary | ICD-10-CM | POA: Diagnosis present

## 2014-04-03 LAB — COMPREHENSIVE METABOLIC PANEL
ALBUMIN: 3.4 g/dL — AB (ref 3.5–5.2)
ALK PHOS: 108 U/L (ref 39–117)
ALT: 13 U/L (ref 0–35)
AST: 25 U/L (ref 0–37)
Anion gap: 11 (ref 5–15)
BUN: 19 mg/dL (ref 6–23)
CHLORIDE: 99 meq/L (ref 96–112)
CO2: 30 mEq/L (ref 19–32)
CREATININE: 1.36 mg/dL — AB (ref 0.50–1.10)
Calcium: 9.4 mg/dL (ref 8.4–10.5)
GFR calc Af Amer: 41 mL/min — ABNORMAL LOW (ref >90)
GFR calc non Af Amer: 35 mL/min — ABNORMAL LOW (ref >90)
Glucose, Bld: 268 mg/dL — ABNORMAL HIGH (ref 70–99)
Potassium: 4.1 mEq/L (ref 3.7–5.3)
Sodium: 140 mEq/L (ref 137–147)
TOTAL PROTEIN: 6.8 g/dL (ref 6.0–8.3)
Total Bilirubin: 0.6 mg/dL (ref 0.3–1.2)

## 2014-04-03 LAB — GLUCOSE, CAPILLARY
GLUCOSE-CAPILLARY: 222 mg/dL — AB (ref 70–99)
GLUCOSE-CAPILLARY: 172 mg/dL — AB (ref 70–99)
GLUCOSE-CAPILLARY: 116 mg/dL — AB (ref 70–99)
GLUCOSE-CAPILLARY: 133 mg/dL — AB (ref 70–99)

## 2014-04-03 LAB — CBC
HEMATOCRIT: 34 % — AB (ref 36.0–46.0)
HEMOGLOBIN: 11.7 g/dL — AB (ref 12.0–15.0)
MCH: 27.2 pg (ref 26.0–34.0)
MCHC: 34.4 g/dL (ref 30.0–36.0)
MCV: 79.1 fL (ref 78.0–100.0)
Platelets: 325 10*3/uL (ref 150–400)
RBC: 4.3 MIL/uL (ref 3.87–5.11)
RDW: 15.9 % — ABNORMAL HIGH (ref 11.5–15.5)
WBC: 16.8 10*3/uL — AB (ref 4.0–10.5)

## 2014-04-03 LAB — PRO B NATRIURETIC PEPTIDE: Pro B Natriuretic peptide (BNP): 1015 pg/mL — ABNORMAL HIGH (ref 0–450)

## 2014-04-03 MED ORDER — ONDANSETRON HCL 4 MG/2ML IJ SOLN: 4.0000 mg | Freq: Four times a day (QID) | INTRAMUSCULAR | Status: DC | PRN

## 2014-04-03 MED ORDER — HYDRALAZINE HCL 20 MG/ML IJ SOLN: 10.0000 mg | INTRAMUSCULAR | Status: DC | PRN

## 2014-04-03 MED ORDER — ONDANSETRON HCL 4 MG/2ML IJ SOLN
4.0000 mg | Freq: Once | INTRAMUSCULAR | Status: AC
  Administered 2014-04-03: 4 mg via INTRAVENOUS
  Filled 2014-04-03: qty 2

## 2014-04-03 MED ORDER — WARFARIN SODIUM 2.5 MG PO TABS
2.5000 mg | ORAL_TABLET | Freq: Once | ORAL | Status: AC
  Administered 2014-04-03: 2.5 mg via ORAL
  Filled 2014-04-03: qty 1

## 2014-04-03 MED ORDER — SODIUM CHLORIDE 0.9 % IJ SOLN
3.0000 mL | Freq: Two times a day (BID) | INTRAMUSCULAR | Status: DC
  Administered 2014-04-03 – 2014-04-08 (×7): 3 mL via INTRAVENOUS

## 2014-04-03 MED ORDER — DILTIAZEM HCL ER COATED BEADS 240 MG PO CP24
240.0000 mg | ORAL_CAPSULE | Freq: Every day | ORAL | Status: DC
  Administered 2014-04-03 – 2014-04-08 (×6): 240 mg via ORAL
  Filled 2014-04-03 (×6): qty 1

## 2014-04-03 MED ORDER — LEVOTHYROXINE SODIUM 50 MCG PO TABS
50.0000 ug | ORAL_TABLET | Freq: Every day | ORAL | Status: DC
  Administered 2014-04-04 – 2014-04-08 (×5): 50 ug via ORAL
  Filled 2014-04-03 (×7): qty 1

## 2014-04-03 MED ORDER — INSULIN ASPART 100 UNIT/ML ~~LOC~~ SOLN
0.0000 [IU] | Freq: Three times a day (TID) | SUBCUTANEOUS | Status: DC
  Administered 2014-04-03: 3 [IU] via SUBCUTANEOUS
  Administered 2014-04-03: 2 [IU] via SUBCUTANEOUS
  Administered 2014-04-04 (×2): 1 [IU] via SUBCUTANEOUS
  Administered 2014-04-07: 2 [IU] via SUBCUTANEOUS
  Administered 2014-04-07: 1 [IU] via SUBCUTANEOUS

## 2014-04-03 MED ORDER — SODIUM CHLORIDE 0.9 % IV SOLN: 250.0000 mL | INTRAVENOUS | Status: DC | PRN

## 2014-04-03 MED ORDER — SODIUM CHLORIDE 0.9 % IV SOLN
INTRAVENOUS | Status: DC
Start: 2014-04-03 — End: 2014-04-07
  Administered 2014-04-03 – 2014-04-06 (×4): via INTRAVENOUS

## 2014-04-03 MED ORDER — ONDANSETRON HCL 4 MG PO TABS: 4.0000 mg | ORAL_TABLET | Freq: Four times a day (QID) | ORAL | Status: DC | PRN

## 2014-04-03 MED ORDER — SODIUM CHLORIDE 0.9 % IJ SOLN: 3.0000 mL | INTRAMUSCULAR | Status: DC | PRN

## 2014-04-03 MED ORDER — MORPHINE SULFATE 2 MG/ML IJ SOLN: 1.0000 mg | INTRAMUSCULAR | Status: DC | PRN

## 2014-04-03 MED ORDER — WARFARIN - PHARMACIST DOSING INPATIENT
Freq: Every day | Status: DC
  Administered 2014-04-05: 18:00:00

## 2014-04-03 MED ORDER — SIMVASTATIN 40 MG PO TABS
40.0000 mg | ORAL_TABLET | Freq: Every day | ORAL | Status: DC
  Administered 2014-04-03 – 2014-04-04 (×2): 40 mg via ORAL
  Filled 2014-04-03 (×2): qty 1

## 2014-04-03 MED ORDER — HEPARIN SODIUM (PORCINE) 5000 UNIT/ML IJ SOLN
5000.0000 [IU] | Freq: Three times a day (TID) | INTRAMUSCULAR | Status: DC
  Administered 2014-04-03: 5000 [IU] via SUBCUTANEOUS

## 2014-04-03 MED ORDER — LABETALOL HCL 5 MG/ML IV SOLN
10.0000 mg | INTRAVENOUS | Status: DC | PRN
  Administered 2014-04-05 – 2014-04-06 (×2): 10 mg via INTRAVENOUS
  Filled 2014-04-03 (×2): qty 4

## 2014-04-03 MED ORDER — METOPROLOL TARTRATE 25 MG PO TABS
25.0000 mg | ORAL_TABLET | Freq: Two times a day (BID) | ORAL | Status: DC
  Administered 2014-04-03 – 2014-04-06 (×8): 25 mg via ORAL
  Filled 2014-04-03 (×10): qty 1

## 2014-04-03 MED ORDER — MORPHINE SULFATE 4 MG/ML IJ SOLN
4.0000 mg | Freq: Once | INTRAMUSCULAR | Status: AC
  Administered 2014-04-03: 4 mg via INTRAVENOUS
  Filled 2014-04-03: qty 1

## 2014-04-03 MED ORDER — MAGNESIUM OXIDE 400 (241.3 MG) MG PO TABS
400.0000 mg | ORAL_TABLET | Freq: Two times a day (BID) | ORAL | Status: DC
  Administered 2014-04-03 – 2014-04-08 (×11): 400 mg via ORAL
  Filled 2014-04-03 (×13): qty 1

## 2014-04-03 NOTE — Progress Notes (Signed)
[facility] PROGRESS NOTE  Nicole [name] IZT:[id number] DOB: Nov 02, 1930 DOA: 04/02/2014 PCP: Hollace Kinnier, DO  Assessment/Plan: 1. Probable small bowel obstruction -Patient with history of abdominal surgeries and recurrent small bowel obstructions, presenting with complaints of nausea vomiting abdominal pain and abdominal distention. -CT scan of abdomen and pelvis showing small bowel loops distended to 4.1 cm, with an apparent transition point at the mid ileum. Findings seem to be consistent with small bowel obstruction. -General surgery following -Attempt conservative management for now -Continue n.p.o. status, IV fluids, supportive care  2. Chronic systolic congestive heart failure. -Last transthoracic echocardiogram performed 12/04/2009 which showed an ejection fraction of 45-50% -She was made n.p.o. for small bowel obstruction, Lasix currently being held. -Carefully monitoring volume status at this point  3. Hypertension. -Patient hypertensive with systolic blood pressures in the 170s. -Improving this morning. Continue diltiazem and metoprolol along with as needed hydralazine  4. Type 2 diabetes mellitus.  -Hemoglobin A1c 5.5 on 01/26/2014 -Hypoglycemic agents held as she is currently n.p.o.  5. Atrial fibrillation. -Presented with INR of 2.15, Coumadin held -Continue Metoprolol and Cardizem  Code Status: Full Code Family Communication:  Disposition Plan: Continue supportive care   Consultants:  General surgery   HPI/Subjective: Patient is a pleasant 80 year old female with a past medical history of hemicolectomy, recurrent small bowel obstructions, who presented overnight to the emergency room with complaints of abdominal pain, distention, nausea and vomiting. A CT scan of abdomen and pelvis performed in the emergency department revealing findings suggestive of small bowel obstruction. General surgery was consulted. NG tube was placed. Patient currently n.p.o.,  reports feeling about the same.  Objective: Filed Vitals:   04/03/14 1407  BP: 140/96  Pulse: 107  Temp: 98.2 F (36.8 C)  Resp: 12    Intake/Output Summary (Last 24 hours) at 04/03/14 1409 Last data filed at 04/03/14 1200  Gross per 24 hour  Intake   1000 ml  Output    402 ml  Net    598 ml   Filed Weights   04/02/14 2205 04/03/14 0516  Weight: 80.287 kg (177 lb) 79.2 kg (174 lb 9.7 oz)    Exam:   General:  Ill-appearing, no acute distress she is awake and alert  Cardiovascular: Irregular rate and rhythm normal S1-S2  Respiratory: Normal respiratory effort , lungs are auscultation bilaterally   Abdomen: Her abdomen is distended, mild to moderate generalized pain to palpation  Musculoskeletal: No edema  Data Reviewed: Basic Metabolic Panel:  Recent Labs Lab 04/02/14 2233 04/03/14 0549  NA 141 140  K 4.0 4.1  CL 99 99  CO2 27 30  GLUCOSE 159* 268*  BUN 19 19  CREATININE 1.41* 1.36*  CALCIUM 9.5 9.4   Liver Function Tests:  Recent Labs Lab 04/02/14 2233 04/03/14 0549  AST 16 25  ALT 12 13  ALKPHOS 109 108  BILITOT 0.5 0.6  PROT 7.6 6.8  ALBUMIN 3.8 3.4*    Recent Labs Lab 04/02/14 2233  LIPASE 47   No results found for this basename: AMMONIA,  in the last 168 hours CBC:  Recent Labs Lab 04/02/14 2233 04/03/14 0549  WBC 11.6* 16.8*  NEUTROABS 9.2*  --   HGB 12.2 11.7*  HCT 35.0* 34.0*  MCV 78.3 79.1  PLT 335 325   Cardiac Enzymes: No results found for this basename: CKTOTAL, CKMB, CKMBINDEX, TROPONINI,  in the last 168 hours BNP (last 3 results)  Recent Labs  04/03/14 0549  PROBNP 1015.0*  CBG:  Recent Labs Lab 04/03/14 0736 04/03/14 1142  GLUCAP 222* 172*    No results found for this or any previous visit (from the past 240 hour(s)).   Studies: Ct Abdomen Pelvis Wo Contrast  04/03/2014   CLINICAL DATA:  Severe abdominal pain, nausea and vomiting.  EXAM: CT ABDOMEN AND PELVIS WITHOUT CONTRAST  TECHNIQUE:  Multidetector CT imaging of the abdomen and pelvis was performed following the standard protocol without IV contrast.  COMPARISON:  CT of the abdomen and pelvis from 01/23/2014  FINDINGS: Minimal bibasilar atelectasis is noted.  The liver is unremarkable in appearance. Splenosis is noted at the left upper quadrant. The patient is status post cholecystectomy, with clips noted along the gallbladder fossa. The pancreas and adrenal glands are unremarkable.  Mild bilateral renal atrophy and left renal scarring are noted. The kidneys are otherwise unremarkable. Mild nonspecific perinephric stranding is noted bilaterally. There is no evidence of hydronephrosis. No renal or ureteral stones are seen.  There is distention of small-bowel loops to 4.1 cm in maximal diameter, with gradual dilution of contrast to the level of an apparent transition point at the mid ileum at the left lower quadrant. There is fecalization of the ileum just proximal to the transition point. This raises question for partial small bowel obstruction, possibly due to an adhesion.  More distal small bowel loops are mostly decompressed, though trace air is still seen within distal small bowel loops. No free fluid is identified. The stomach is within normal limits. No acute vascular abnormalities are seen. Scattered calcification is seen along the abdominal aorta and its branches.  The appendix is normal in caliber and contains air, without evidence for appendicitis. Scattered diverticulosis is noted along the ascending and proximal transverse colon. A bowel suture line is noted along the descending colon. Minimal diverticulosis is seen along the sigmoid colon. The colon is otherwise unremarkable.  The bladder is mildly distended and grossly unremarkable. The patient is status post hysterectomy. No suspicious adnexal masses are seen. The ovaries are relatively symmetric. No inguinal lymphadenopathy is seen.  No acute osseous abnormalities are identified.   IMPRESSION: 1. Distention of small-bowel loops to 4.1 cm in maximal diameter, with gradual dilution of contrast and an apparent transition point at the mid ileum at the left lower quadrant. There is fecalization of the ileum just proximal to the transition point, raising concern for partial small-bowel obstruction, possibly due to an adhesion. More distal small bowel loops are mostly decompressed, though trace air is still seen within distal small bowel loops. 2. Scattered calcification along the abdominal aorta and its branches. 3. Scattered diverticulosis along the ascending and proximal transverse colon. Minimal diverticulosis along the sigmoid colon. 4. Splenosis noted at the left upper quadrant. 5. Mild bilateral renal atrophy and left renal scarring noted.   Electronically Signed   By: Garald Balding M.D.   On: 04/03/2014 02:44   Dg Abd Portable 1v  04/03/2014   CLINICAL DATA:  Check placement of nasogastric tube.  EXAM: PORTABLE ABDOMEN - 1 VIEW  COMPARISON:  Abdominal radiograph performed 01/28/2014, and CT of the abdomen and pelvis performed earlier today at 2:31 a.m.  FINDINGS: The enteric tube is noted ending at the body of the stomach.  The visualized bowel gas pattern is unremarkable. Scattered air and stool filled loops of colon are seen; no abnormal dilatation of small bowel loops is seen to suggest small bowel obstruction. No free intra-abdominal air is identified, though evaluation for free air is  limited on a single supine view. Clips are noted within the right upper quadrant, reflecting prior cholecystectomy.  The visualized osseous structures are within normal limits; the sacroiliac joints are unremarkable in appearance. The visualized lung bases are essentially clear.  IMPRESSION: Enteric tube noted ending at the body of the stomach.   Electronically Signed   By: Garald Balding M.D.   On: 04/03/2014 06:54    Scheduled Meds: . diltiazem  240 mg Oral Daily  . insulin aspart  0-9 Units  Subcutaneous TID WC  . levothyroxine  50 mcg Oral QAC breakfast  . magnesium oxide  400 mg Oral BID  . metoprolol  25 mg Oral BID  . simvastatin  40 mg Oral Daily  . sodium chloride  3 mL Intravenous Q12H  . warfarin  2.5 mg Oral ONCE-1800  . Warfarin - Pharmacist Dosing Inpatient   Does not apply q1800   Continuous Infusions: . sodium chloride 50 mL/hr at 04/03/14 0604    Principal Problem:   SOB (shortness of breath) Active Problems:   History of colon cancer, stage III   CHF, past NICM with an EF of 30%, most recent echo 2011 showed EF to be45- 50%   Type II or unspecified type diabetes mellitus with neurological manifestations, not stated as uncontrolled   HTN (hypertension)   Hypothyroidism    Time spent: 35 min    Kelvin Cellar  [facility] Pager [phone]. If 7PM-7AM, please contact night-coverage at [email or web address], password [facility] 04/03/2014, 2:09 PM  LOS: 1 day

## 2014-04-03 NOTE — Progress Notes (Signed)
PT Cancellation Note  Patient Details Name: Nicole [name] MRN: [id number] DOB: 11-Feb-1931   Cancelled Treatment:    Reason Eval/Treat Not Completed: Medical issues which prohibited therapy Holding PT evaluation per RN request as pt continues to have abdominal pain, nausea, vomiting and not able to "get belly cleared out" per RN. Pt on strict bedrest orders and has NG tube. Will follow up on 9/13 or when pt appropriate to participate in therapy.   Candy Sledge A 04/03/2014, 2:39 PM Candy Sledge, [place], DPT [phone]

## 2014-04-03 NOTE — Progress Notes (Signed)
ANTICOAGULATION CONSULT NOTE - Initial Consult  Pharmacy Consult for Coumadin Indication: atrial fibrillation  Allergies  Allergen Reactions  . Iohexol      Code: VOM, Desc: PT REPORTS VOMITING W/ IVP DYE- ARS 12/26/08, Onset Date: [id number]   . Z-Pak [Azithromycin]   . Iodinated Diagnostic Agents Nausea Only    PT HAS HAD SINGULAR INCIDENT OF NAUSEA  W/ IV CONTYRAST INJECTION, NONE IF INJECTED SLOWLY//A.[place]    Patient Measurements: Height: 5\' 9"  (175.3 cm) Weight: 174 lb 9.7 oz (79.2 kg) IBW/kg (Calculated) : 66.2  Vital Signs: Temp: 97.5 F (36.4 C) (09/12 0516) Temp src: Oral (09/12 0516) BP: 172/113 mmHg (09/12 0516) Pulse Rate: 121 (09/12 0516)  Labs:  Recent Labs  03/31/14 1005 04/02/14 2233 04/02/14 2324  HGB  --  12.2  --   HCT  --  35.0*  --   PLT  --  335  --   LABPROT  --   --  24.0*  INR 2.1  --  2.15*  CREATININE  --  1.41*  --     Estimated Creatinine Clearance: 32.7 ml/min (by C-G formula based on Cr of 1.41).   Medical History: Past Medical History  Diagnosis Date  . Colon cancer 07/1997  . Hypertension   . Diabetes mellitus   . CHF (congestive heart failure)   . AAA (abdominal aortic aneurysm)   . Atrial fibrillation   . Hypercholesteremia   . Kidney stone     renal calculi  . Small bowel obstruction due to adhesions 05/16/2012  . Hypothyroidism   . Arthritis   . Anemia   . Cholelithiasis   . Perforation of bile duct   . Hemorrhage of rectum and anus   . Swelling, mass, or lump in head and neck   . Cervicalgia   . Hypoglycemia, unspecified   . Pain in joint, lower leg   . Anxiety   . Shortness of breath   . Acute upper respiratory infections of unspecified site   . Depression   . Thoracic aneurysm without mention of rupture   . GERD (gastroesophageal reflux disease)   . Proteinuria   . Other specified cardiac dysrhythmias(427.89)   . Congestive heart failure, unspecified   . Electrolyte and fluid disorders not elsewhere  classified   . Other chronic allergic conjunctivitis   . Hypopotassemia   . Chronic systolic heart failure   . Dizziness and giddiness   . Malignant neoplasm of colon, unspecified site   . Other and unspecified hyperlipidemia   . Gout, unspecified   . Atrial fibrillation   . Aortic aneurysm of unspecified site without mention of rupture   . Pain in joint, site unspecified   . Palpitations   . CKD (chronic kidney disease)     Medications:  Prescriptions prior to admission  Medication Sig Dispense Refill  . diltiazem (CARTIA XT) 240 MG 24 hr capsule Take 1 capsule (240 mg total) by mouth daily.  30 capsule  9  . furosemide (LASIX) 40 MG tablet Take 40 mg by mouth 2 (two) times daily.      Marland Kitchen glipiZIDE (GLUCOTROL) 10 MG tablet Take 10 mg by mouth daily before breakfast.      . levothyroxine (SYNTHROID, LEVOTHROID) 50 MCG tablet Take 1 tablet (50 mcg total) by mouth daily.  90 tablet  3  . magnesium oxide (MAG-OX) 400 (241.3 MG) MG tablet Take 400 mg by mouth 2 (two) times daily.      . metFORMIN (GLUCOPHAGE)  500 MG tablet Take 1 tablet (500 mg total) by mouth 2 (two) times daily with a meal.  60 tablet  5  . metoprolol (LOPRESSOR) 25 MG tablet Take 1 tablet (25 mg total) by mouth 2 (two) times daily.  60 tablet  3  . polyethylene glycol (MIRALAX / GLYCOLAX) packet Take 17 g by mouth daily as needed for mild constipation.       . potassium chloride SA (K-DUR,KLOR-CON) 20 MEQ tablet Take 20 mEq by mouth daily.      . simvastatin (ZOCOR) 40 MG tablet Take 40 mg by mouth daily.      Marland Kitchen warfarin (COUMADIN) 5 MG tablet Take 2.5-5 mg by mouth See admin instructions. Take 1 tablet Monday ,Wednesday and Friday.  Take 0.5 tablet on Tuesday, Thursday, Saturday and Sunday.       Scheduled:  . diltiazem  240 mg Oral Daily  . heparin  5,000 Units Subcutaneous 3 times per day  . insulin aspart  0-9 Units Subcutaneous TID WC  . levothyroxine  50 mcg Oral QAC breakfast  . magnesium oxide  400 mg Oral  BID  . metoprolol  25 mg Oral BID  . simvastatin  40 mg Oral Daily  . sodium chloride  3 mL Intravenous Q12H   Infusions:  . sodium chloride      Assessment: 80yo female admitted w/ recurrent SBO, currently no surgery planned, to continue home Coumadin for Afib w/ current INR therapeutic.  Goal of Therapy:  INR 2-3   Plan:  Missed 9/11 dose PTA; will give regularly scheduled Coumadin dose of 2.5mg  today and check INR tomorrow am prior to resuming home regimen.  Wynona Neat, PharmD, BCPS  04/03/2014,5:18 AM

## 2014-04-03 NOTE — H&P (Signed)
[facility] History and Physical  Nicole [name] IOE:[id number] DOB: 1931-03-05 DOA: 04/02/2014  Referring physician: ED physician PCP: Hollace Kinnier, DO  Specialists:   Chief Complaint: Nausea, vomiting, abdominal pain  HPI: Nicole [name] is a 80 y.o. female with PMH significnat for SOB, HTN, DM, CHF, AFIB on coumadin, AAA, colon CA, who presents with abdominal pain.    Patient has a multiple abdominal surgeries in the past, including cholecystectomy, hemicolectomy and ERCP x2. Over the past several years, patient has a recurrent small bowel obstruction. Her last episode was 2 months ago. One week ago he started having nausea, vomiting and abdominal pain again. There is no blood in her vomitus. She does not have fever, chills. She still pass gas. No diarrhea. The symptoms has been progressively getting worse. She could not bear down any thing. She feels very tired. She comes to the emergency room for further evaluation and treatment. CT abdomen showed small bowel obstruction. Patient has leukocytosis with a WBC 11.6. BMP shows electrolyte okay. Surgeon was consulted, suggested NG tube and symptomatic treatment. Patient is admitted to inpatient.   Review of Systems: As presented in the history of presenting illness, rest negative.  Where does patient live?  Lives alone at home in [place] Can patient participate in ADLs? Yes  Allergy:  Allergies  Allergen Reactions  . Iohexol      Code: VOM, Desc: PT REPORTS VOMITING W/ IVP DYE- ARS 12/26/08, Onset Date: 18299371   . Z-Pak [Azithromycin]   . Iodinated Diagnostic Agents Nausea Only    PT HAS HAD SINGULAR INCIDENT OF NAUSEA  W/ IV CONTYRAST INJECTION, NONE IF INJECTED SLOWLY//A.[place]    Past Medical History  Diagnosis Date  . Colon cancer 07/1997  . Hypertension   . Diabetes mellitus   . CHF (congestive heart failure)   . AAA (abdominal aortic aneurysm)   . Atrial fibrillation   . Hypercholesteremia   . Kidney stone      renal calculi  . Small bowel obstruction due to adhesions 05/16/2012  . Hypothyroidism   . Arthritis   . Anemia   . Cholelithiasis   . Perforation of bile duct   . Hemorrhage of rectum and anus   . Swelling, mass, or lump in head and neck   . Cervicalgia   . Hypoglycemia, unspecified   . Pain in joint, lower leg   . Anxiety   . Shortness of breath   . Acute upper respiratory infections of unspecified site   . Depression   . Thoracic aneurysm without mention of rupture   . GERD (gastroesophageal reflux disease)   . Proteinuria   . Other specified cardiac dysrhythmias(427.89)   . Congestive heart failure, unspecified   . Electrolyte and fluid disorders not elsewhere classified   . Other chronic allergic conjunctivitis   . Hypopotassemia   . Chronic systolic heart failure   . Dizziness and giddiness   . Malignant neoplasm of colon, unspecified site   . Other and unspecified hyperlipidemia   . Gout, unspecified   . Atrial fibrillation   . Aortic aneurysm of unspecified site without mention of rupture   . Pain in joint, site unspecified   . Palpitations   . CKD (chronic kidney disease)     Past Surgical History  Procedure Laterality Date  . Hemicolectomy  08/11/1997  . Kidney stone surgery    . Colon surgery      to remove colon ca  . Cholecystectomy  05/14/2012  Procedure: LAPAROSCOPIC CHOLECYSTECTOMY WITH INTRAOPERATIVE CHOLANGIOGRAM;  Surgeon: Haywood Lasso, MD;  Location: [facility];  Service: General;  Laterality: N/A;  . Ercp  05/17/2012    Procedure: ENDOSCOPIC RETROGRADE CHOLANGIOPANCREATOGRAPHY (ERCP);  Surgeon: Missy Sabins, MD;  Location: [facility];  Service: Gastroenterology;  Laterality: N/A;  . Ercp  08/12/2012    Procedure: ENDOSCOPIC RETROGRADE CHOLANGIOPANCREATOGRAPHY (ERCP);  Surgeon: Missy Sabins, MD;  Location: [facility] ENDOSCOPY;  Service: Endoscopy;  Laterality: N/A;  . Transthoracic echocardiogram  11/2009    EF 45-50%, mild-mod AV regurg; mild MR; mod TR;  ascending aorta mildly dilated  . Cardiac catheterization  2008    moderate severe pulm HTN; with elevted pulm capillary wedge pressure     Social History:  reports that she has never smoked. She has never used smokeless tobacco. She reports that she does not drink alcohol or use illicit drugs.  Family History:  Family History  Problem Relation Age of Onset  . Heart disease Mother   . Stroke Father   . Hypertension Daughter   . Hypertension Son   . Diabetes Sister   . Hypertension Son      Prior to Admission medications   Medication Sig Start Date End Date Taking? Authorizing Provider  diltiazem (CARTIA XT) 240 MG 24 hr capsule Take 1 capsule (240 mg total) by mouth daily. 02/23/14  Yes Pixie Casino, MD  furosemide (LASIX) 40 MG tablet Take 40 mg by mouth 2 (two) times daily.   Yes Historical Provider, MD  glipiZIDE (GLUCOTROL) 10 MG tablet Take 10 mg by mouth daily before breakfast.   Yes Historical Provider, MD  levothyroxine (SYNTHROID, LEVOTHROID) 50 MCG tablet Take 1 tablet (50 mcg total) by mouth daily. 07/01/13  Yes Pricilla Larsson, NP  magnesium oxide (MAG-OX) 400 (241.3 MG) MG tablet Take 400 mg by mouth 2 (two) times daily.   Yes Historical Provider, MD  metFORMIN (GLUCOPHAGE) 500 MG tablet Take 1 tablet (500 mg total) by mouth 2 (two) times daily with a meal. 01/18/14  Yes [name] [name], DO  metoprolol (LOPRESSOR) 25 MG tablet Take 1 tablet (25 mg total) by mouth 2 (two) times daily. 02/15/14  Yes Orpah Greek, MD  polyethylene glycol (MIRALAX / GLYCOLAX) packet Take 17 g by mouth daily as needed for mild constipation.    Yes Historical Provider, MD  potassium chloride SA (K-DUR,KLOR-CON) 20 MEQ tablet Take 20 mEq by mouth daily.   Yes Historical Provider, MD  simvastatin (ZOCOR) 40 MG tablet Take 40 mg by mouth daily.   Yes Historical Provider, MD  warfarin (COUMADIN) 5 MG tablet Take 2.5-5 mg by mouth See admin instructions. Take 1 tablet Monday ,Wednesday and  Friday.  Take 0.5 tablet on Tuesday, Thursday, Saturday and Sunday.   Yes Historical Provider, MD    Physical Exam: Filed Vitals:   04/03/14 0215 04/03/14 0245 04/03/14 0315 04/03/14 0345  BP: 151/100 168/100 157/106 191/124  Pulse: 92 124 118 116  Temp:      Resp:    17  Height:      Weight:      SpO2:  97% 96% 96%   General: Not in acute distress HEENT:       Eyes: PERRL, EOMI, no scleral icterus       ENT: No discharge from the ears and nose, no pharynx injection, no tonsillar enlargement.        Neck: No JVD, no bruit, no mass felt. Cardiac: S1/S2, irregularly irregular  rhythm, No murmurs, gallops or rubs Pulm: Good air movement bilaterally. Clear to auscultation bilaterally. No rales, wheezing, rhonchi or rubs. Abd:  Distended; there is tenderness in the epigastric area and periumbilical area. No rebound or guarding. Bowel sounds are normal. Well healed mid-line surgical incision; no CVA tenderness.  Ext: No edema. 2+DP/PT pulse bilaterally Musculoskeletal: No joint deformities, erythema, or stiffness, ROM full Skin: No rashes.  Neuro: Alert and oriented X3, cranial nerves II-XII grossly intact, muscle strength 5/5 in all extremeties, sensation to light touch intact.  Psych: Patient is not psychotic, no suicidal or hemocidal ideation.  Labs on Admission:  Basic Metabolic Panel:  Recent Labs Lab 04/02/14 2233  NA 141  K 4.0  CL 99  CO2 27  GLUCOSE 159*  BUN 19  CREATININE 1.41*  CALCIUM 9.5   Liver Function Tests:  Recent Labs Lab 04/02/14 2233  AST 16  ALT 12  ALKPHOS 109  BILITOT 0.5  PROT 7.6  ALBUMIN 3.8    Recent Labs Lab 04/02/14 2233  LIPASE 47   No results found for this basename: AMMONIA,  in the last 168 hours CBC:  Recent Labs Lab 04/02/14 2233  WBC 11.6*  NEUTROABS 9.2*  HGB 12.2  HCT 35.0*  MCV 78.3  PLT 335   Cardiac Enzymes: No results found for this basename: CKTOTAL, CKMB, CKMBINDEX, TROPONINI,  in the last 168  hours  BNP (last 3 results) No results found for this basename: PROBNP,  in the last 8760 hours CBG: No results found for this basename: GLUCAP,  in the last 168 hours  Radiological Exams on Admission: Ct Abdomen Pelvis Wo Contrast  04/03/2014   CLINICAL DATA:  Severe abdominal pain, nausea and vomiting.  EXAM: CT ABDOMEN AND PELVIS WITHOUT CONTRAST  TECHNIQUE: Multidetector CT imaging of the abdomen and pelvis was performed following the standard protocol without IV contrast.  COMPARISON:  CT of the abdomen and pelvis from 01/23/2014  FINDINGS: Minimal bibasilar atelectasis is noted.  The liver is unremarkable in appearance. Splenosis is noted at the left upper quadrant. The patient is status post cholecystectomy, with clips noted along the gallbladder fossa. The pancreas and adrenal glands are unremarkable.  Mild bilateral renal atrophy and left renal scarring are noted. The kidneys are otherwise unremarkable. Mild nonspecific perinephric stranding is noted bilaterally. There is no evidence of hydronephrosis. No renal or ureteral stones are seen.  There is distention of small-bowel loops to 4.1 cm in maximal diameter, with gradual dilution of contrast to the level of an apparent transition point at the mid ileum at the left lower quadrant. There is fecalization of the ileum just proximal to the transition point. This raises question for partial small bowel obstruction, possibly due to an adhesion.  More distal small bowel loops are mostly decompressed, though trace air is still seen within distal small bowel loops. No free fluid is identified. The stomach is within normal limits. No acute vascular abnormalities are seen. Scattered calcification is seen along the abdominal aorta and its branches.  The appendix is normal in caliber and contains air, without evidence for appendicitis. Scattered diverticulosis is noted along the ascending and proximal transverse colon. A bowel suture line is noted along the  descending colon. Minimal diverticulosis is seen along the sigmoid colon. The colon is otherwise unremarkable.  The bladder is mildly distended and grossly unremarkable. The patient is status post hysterectomy. No suspicious adnexal masses are seen. The ovaries are relatively symmetric. No inguinal lymphadenopathy is seen.  No acute osseous abnormalities are identified.  IMPRESSION: 1. Distention of small-bowel loops to 4.1 cm in maximal diameter, with gradual dilution of contrast and an apparent transition point at the mid ileum at the left lower quadrant. There is fecalization of the ileum just proximal to the transition point, raising concern for partial small-bowel obstruction, possibly due to an adhesion. More distal small bowel loops are mostly decompressed, though trace air is still seen within distal small bowel loops. 2. Scattered calcification along the abdominal aorta and its branches. 3. Scattered diverticulosis along the ascending and proximal transverse colon. Minimal diverticulosis along the sigmoid colon. 4. Splenosis noted at the left upper quadrant. 5. Mild bilateral renal atrophy and left renal scarring noted.   Electronically Signed   By: Garald Balding M.D.   On: 04/03/2014 02:44    EKG: Independently reviewed. Sinus rhythm, regular, normal axis, normal R wave progression, normal QT interval, No ischemic change in T waves or ST segments.  Assessment/Plan Principal Problem:   SOB (shortness of breath) Active Problems:   History of colon cancer, stage III   CHF, past NICM with an EF of 30%, most recent echo 2011 showed EF to be45- 50%   Type II or unspecified type diabetes mellitus with neurological manifestations, not stated as uncontrolled   HTN (hypertension)   Hypothyroidism   1. small bowel obstruction: This is a recurrent issue. CT abdomen confirmed the diagnosis. Lipase negative. Surgeon was consulted, suggested conservative treatment to begin with.  - will admit to  med-surg bed - Surgeon recommended IVF. Since Patient has CHF with EF 45 to 50%. Her BW has slightly increased from baseline 174 to 177 pounds per patient. Currently her volume status seems to be okay. I will d/c her home lasix and start very gentle IVF, NS 50 cc/h, check Pro BNP in AM. Follow volume status carefully.  - NPO - NG tube - morphine for pain and zofran for nausea - CMP and CBC in AM - PT/OT  2. CHF: 2-D echo on 12/04/09 showed EF of 45-50%. Reports that her body weight is slightly increased by 3 pounds. Currently her volume status assessed to be okay. - hold home lasix as #1 - gentle IVF as #1 - follow up proBNP and her volume status very carefully.  3.Type 2 diabetes: Last A1c was 5.5 on 01/26/14. -Sliding scale insulin -d/c home oral medications  4. Hypothyroidism: Currently on Synthroid. Last TSH was 3.62 on 12/03/13. - Continue home medication  5. A. fib: Heart rate is controlled. On Coumadin at home. INR is 2.15 on admission. -Continue Coumadin for now. -Will stop Coumadin if any signs of necessity for surgery   DVT ppx: SQ Heparin         SQ Lovenox  Code Status: discussed with patient, she is not ready to make her decision yet. Will put full code now, need to discuss with her again Family Communication: Yes, patient's family at bed side Disposition Plan: Admit to inpatient  Ivor Costa [facility] Pager [phone]  If 7PM-7AM, please contact night-coverage [email or web address] Password [email or web address] 04/03/2014, 3:59 AM

## 2014-04-03 NOTE — Progress Notes (Signed)
Nicole [name] was admitted to [facility] from the ER around 0500.  Patient was actively vomiting and in mild pain.  Will put in ordered NG tube and check placement via CXR.  Blood pressure is also high at 251G systolic but this is chronic per report from ER nurse.  Will continue to monitor.

## 2014-04-03 NOTE — Consult Note (Signed)
Reason for Consult:SBO Referring Physician: Roxanne Mins MD  Nicole [name] is an 80 y.o. female.  HPI: Asked to see pt at request of Dr Roxanne Mins for 1 day hx of diffuse dull abdominal pain,  Nausea and vomiting.  No BM today.  Pain 5 - 6 diffuse crampy.  No flatus today.  Hx of previous colon resection in 1999 for cancer.  Has intermittent pSBO with last one in July 2015 managed non operatively.   CT shows SBO.   Past Medical History  Diagnosis Date  . Colon cancer 07/1997  . Hypertension   . Diabetes mellitus   . CHF (congestive heart failure)   . AAA (abdominal aortic aneurysm)   . Atrial fibrillation   . Hypercholesteremia   . Kidney stone     renal calculi  . Small bowel obstruction due to adhesions 05/16/2012  . Hypothyroidism   . Arthritis   . Anemia   . Cholelithiasis   . Perforation of bile duct   . Hemorrhage of rectum and anus   . Swelling, mass, or lump in head and neck   . Cervicalgia   . Hypoglycemia, unspecified   . Pain in joint, lower leg   . Anxiety   . Shortness of breath   . Acute upper respiratory infections of unspecified site   . Depression   . Thoracic aneurysm without mention of rupture   . GERD (gastroesophageal reflux disease)   . Proteinuria   . Other specified cardiac dysrhythmias(427.89)   . Congestive heart failure, unspecified   . Electrolyte and fluid disorders not elsewhere classified   . Other chronic allergic conjunctivitis   . Hypopotassemia   . Chronic systolic heart failure   . Dizziness and giddiness   . Malignant neoplasm of colon, unspecified site   . Other and unspecified hyperlipidemia   . Gout, unspecified   . Atrial fibrillation   . Aortic aneurysm of unspecified site without mention of rupture   . Pain in joint, site unspecified   . Palpitations   . CKD (chronic kidney disease)     Past Surgical History  Procedure Laterality Date  . Hemicolectomy  08/11/1997  . Kidney stone surgery    . Colon surgery      to remove colon  ca  . Cholecystectomy  05/14/2012    Procedure: LAPAROSCOPIC CHOLECYSTECTOMY WITH INTRAOPERATIVE CHOLANGIOGRAM;  Surgeon: Haywood Lasso, MD;  Location: [facility];  Service: General;  Laterality: N/A;  . Ercp  05/17/2012    Procedure: ENDOSCOPIC RETROGRADE CHOLANGIOPANCREATOGRAPHY (ERCP);  Surgeon: Missy Sabins, MD;  Location: [facility];  Service: Gastroenterology;  Laterality: N/A;  . Ercp  08/12/2012    Procedure: ENDOSCOPIC RETROGRADE CHOLANGIOPANCREATOGRAPHY (ERCP);  Surgeon: Missy Sabins, MD;  Location: [facility] ENDOSCOPY;  Service: Endoscopy;  Laterality: N/A;  . Transthoracic echocardiogram  11/2009    EF 45-50%, mild-mod AV regurg; mild MR; mod TR; ascending aorta mildly dilated  . Cardiac catheterization  2008    moderate severe pulm HTN; with elevted pulm capillary wedge pressure     Family History  Problem Relation Age of Onset  . Heart disease Mother   . Stroke Father   . Hypertension Daughter   . Hypertension Son   . Diabetes Sister   . Hypertension Son     Social History:  reports that she has never smoked. She has never used smokeless tobacco. She reports that she does not drink alcohol or use illicit drugs.  Allergies:  Allergies  Allergen Reactions  .  Iohexol      Code: VOM, Desc: PT REPORTS VOMITING W/ IVP DYE- ARS 12/26/08, Onset Date: 76811572   . Z-Pak [Azithromycin]   . Iodinated Diagnostic Agents Nausea Only    PT HAS HAD SINGULAR INCIDENT OF NAUSEA  W/ IV CONTYRAST INJECTION, NONE IF INJECTED SLOWLY//A.CALHOUN    Medications: I have reviewed the patient's current medications.  Results for orders placed during the hospital encounter of 04/02/14 (from the past 48 hour(s))  URINALYSIS, ROUTINE W REFLEX MICROSCOPIC     Status: Abnormal   Collection Time    04/02/14 10:25 PM      Result Value Ref Range   Color, Urine YELLOW  YELLOW   APPearance CLOUDY (*) CLEAR   Specific Gravity, Urine 1.014  1.005 - 1.030   pH 8.0  5.0 - 8.0   Glucose, UA NEGATIVE  NEGATIVE mg/dL    Hgb urine dipstick SMALL (*) NEGATIVE   Bilirubin Urine NEGATIVE  NEGATIVE   Ketones, ur 15 (*) NEGATIVE mg/dL   Protein, ur 100 (*) NEGATIVE mg/dL   Urobilinogen, UA 1.0  0.0 - 1.0 mg/dL   Nitrite NEGATIVE  NEGATIVE   Leukocytes, UA NEGATIVE  NEGATIVE  URINE MICROSCOPIC-ADD ON     Status: Abnormal   Collection Time    04/02/14 10:25 PM      Result Value Ref Range   Squamous Epithelial / LPF RARE  RARE   WBC, UA 3-6  <3 WBC/hpf   RBC / HPF 3-6  <3 RBC/hpf   Bacteria, UA FEW (*) RARE   Urine-Other AMORPHOUS URATES/PHOSPHATES    CBC WITH DIFFERENTIAL     Status: Abnormal   Collection Time    04/02/14 10:33 PM      Result Value Ref Range   WBC 11.6 (*) 4.0 - 10.5 K/uL   RBC 4.47  3.87 - 5.11 MIL/uL   Hemoglobin 12.2  12.0 - 15.0 g/dL   HCT 35.0 (*) 36.0 - 46.0 %   MCV 78.3  78.0 - 100.0 fL   MCH 27.3  26.0 - 34.0 pg   MCHC 34.9  30.0 - 36.0 g/dL   RDW 15.8 (*) 11.5 - 15.5 %   Platelets 335  150 - 400 K/uL   Neutrophils Relative % 80 (*) 43 - 77 %   Neutro Abs 9.2 (*) 1.7 - 7.7 K/uL   Lymphocytes Relative 12  12 - 46 %   Lymphs Abs 1.4  0.7 - 4.0 K/uL   Monocytes Relative 8  3 - 12 %   Monocytes Absolute 0.9  0.1 - 1.0 K/uL   Eosinophils Relative 0  0 - 5 %   Eosinophils Absolute 0.0  0.0 - 0.7 K/uL   Basophils Relative 0  0 - 1 %   Basophils Absolute 0.0  0.0 - 0.1 K/uL  COMPREHENSIVE METABOLIC PANEL     Status: Abnormal   Collection Time    04/02/14 10:33 PM      Result Value Ref Range   Sodium 141  137 - 147 mEq/L   Potassium 4.0  3.7 - 5.3 mEq/L   Chloride 99  96 - 112 mEq/L   CO2 27  19 - 32 mEq/L   Glucose, Bld 159 (*) 70 - 99 mg/dL   BUN 19  6 - 23 mg/dL   Creatinine, Ser 1.41 (*) 0.50 - 1.10 mg/dL   Calcium 9.5  8.4 - 10.5 mg/dL   Total Protein 7.6  6.0 - 8.3 g/dL   Albumin  3.8  3.5 - 5.2 g/dL   AST 16  0 - 37 U/L   ALT 12  0 - 35 U/L   Alkaline Phosphatase 109  39 - 117 U/L   Total Bilirubin 0.5  0.3 - 1.2 mg/dL   GFR calc non Af Amer 34 (*) >90 mL/min    GFR calc Af Amer 39 (*) >90 mL/min   Comment: (NOTE)     The eGFR has been calculated using the CKD EPI equation.     This calculation has not been validated in all clinical situations.     eGFR's persistently <90 mL/min signify possible Chronic Kidney     Disease.   Anion gap 15  5 - 15  LIPASE, BLOOD     Status: None   Collection Time    04/02/14 10:33 PM      Result Value Ref Range   Lipase 47  11 - 59 U/L  PROTIME-INR     Status: Abnormal   Collection Time    04/02/14 11:24 PM      Result Value Ref Range   Prothrombin Time 24.0 (*) 11.6 - 15.2 seconds   INR 2.15 (*) 0.00 - 1.49  I-STAT TROPOININ, ED     Status: None   Collection Time    04/02/14 11:36 PM      Result Value Ref Range   Troponin i, poc 0.00  0.00 - 0.08 ng/mL   Comment 3            Comment: Due to the release kinetics of cTnI,     a negative result within the first hours     of the onset of symptoms does not rule out     myocardial infarction with certainty.     If myocardial infarction is still suspected,     repeat the test at appropriate intervals.    Ct Abdomen Pelvis Wo Contrast  04/03/2014   CLINICAL DATA:  Severe abdominal pain, nausea and vomiting.  EXAM: CT ABDOMEN AND PELVIS WITHOUT CONTRAST  TECHNIQUE: Multidetector CT imaging of the abdomen and pelvis was performed following the standard protocol without IV contrast.  COMPARISON:  CT of the abdomen and pelvis from 01/23/2014  FINDINGS: Minimal bibasilar atelectasis is noted.  The liver is unremarkable in appearance. Splenosis is noted at the left upper quadrant. The patient is status post cholecystectomy, with clips noted along the gallbladder fossa. The pancreas and adrenal glands are unremarkable.  Mild bilateral renal atrophy and left renal scarring are noted. The kidneys are otherwise unremarkable. Mild nonspecific perinephric stranding is noted bilaterally. There is no evidence of hydronephrosis. No renal or ureteral stones are seen.  There is  distention of small-bowel loops to 4.1 cm in maximal diameter, with gradual dilution of contrast to the level of an apparent transition point at the mid ileum at the left lower quadrant. There is fecalization of the ileum just proximal to the transition point. This raises question for partial small bowel obstruction, possibly due to an adhesion.  More distal small bowel loops are mostly decompressed, though trace air is still seen within distal small bowel loops. No free fluid is identified. The stomach is within normal limits. No acute vascular abnormalities are seen. Scattered calcification is seen along the abdominal aorta and its branches.  The appendix is normal in caliber and contains air, without evidence for appendicitis. Scattered diverticulosis is noted along the ascending and proximal transverse colon. A bowel suture line is noted along the  descending colon. Minimal diverticulosis is seen along the sigmoid colon. The colon is otherwise unremarkable.  The bladder is mildly distended and grossly unremarkable. The patient is status post hysterectomy. No suspicious adnexal masses are seen. The ovaries are relatively symmetric. No inguinal lymphadenopathy is seen.  No acute osseous abnormalities are identified.  IMPRESSION: 1. Distention of small-bowel loops to 4.1 cm in maximal diameter, with gradual dilution of contrast and an apparent transition point at the mid ileum at the left lower quadrant. There is fecalization of the ileum just proximal to the transition point, raising concern for partial small-bowel obstruction, possibly due to an adhesion. More distal small bowel loops are mostly decompressed, though trace air is still seen within distal small bowel loops. 2. Scattered calcification along the abdominal aorta and its branches. 3. Scattered diverticulosis along the ascending and proximal transverse colon. Minimal diverticulosis along the sigmoid colon. 4. Splenosis noted at the left upper quadrant.  5. Mild bilateral renal atrophy and left renal scarring noted.   Electronically Signed   By: Garald Balding M.D.   On: 04/03/2014 02:44    Review of Systems  Constitutional: Negative for fever and chills.  HENT: Negative.   Eyes: Negative.   Respiratory: Negative for cough and shortness of breath.   Cardiovascular: Positive for palpitations.  Gastrointestinal: Positive for nausea, vomiting, abdominal pain and constipation.  Genitourinary: Negative.   Musculoskeletal: Negative.   Skin: Negative.   Neurological: Positive for weakness.  Endo/Heme/Allergies: Negative.   Psychiatric/Behavioral: Negative.    Blood pressure 151/100, pulse 92, temperature 97.5 F (36.4 C), resp. rate 18, height _0  (1.753 m), weight 177 lb (80.287 kg), SpO2 95.00%. Physical Exam  Constitutional: She is oriented to person, place, and time. She appears well-developed and well-nourished. No distress.  HENT:  Head: Normocephalic and atraumatic.  Eyes: Pupils are equal, round, and reactive to light. No scleral icterus.  Neck: Normal range of motion.  Cardiovascular: An irregularly irregular rhythm present. Tachycardia present.   Respiratory: Effort normal and breath sounds normal.  GI: Soft. She exhibits distension. There is tenderness. There is no rigidity, no rebound and no guarding.    Musculoskeletal: Normal range of motion.  Neurological: She is alert and oriented to person, place, and time.  Skin: Skin is warm and dry.  Psychiatric: She has a normal mood and affect. Her behavior is normal. Judgment and thought content normal.    Assessment/Plan: Recurrent SBO Patient Active Problem List   Diagnosis Date Noted  . Epistaxis 02/15/2014  . SBO (small bowel obstruction) 01/23/2014  . Diabetic nephropathy 09/07/2013  . Chronic constipation 03/25/2013  . Carpal tunnel syndrome 02/25/2013  . Hypothyroidism   . Gout flare 06/01/2012  . Leakage of common bile duct s/p ERCP / stent 05/30/2012  . Ileus  post op. still NPO 05/22/2012  . Bile leak, postoperative 05/17/2012  . Hypokalemia 05/17/2012  . Atrial fibrillation with RVR 05/16/2012  . Postoperative ileus 05/16/2012  . Small bowel obstruction due to adhesions 05/16/2012  . Acute on chronic renal insufficiency 05/15/2012  . Normal coronary arteries, 2008 05/15/2012  . Type II or unspecified type diabetes mellitus with neurological manifestations, not stated as uncontrolled 05/15/2012  . HTN (hypertension) 05/15/2012  . Respiratory distress post op secondary to rapid AF 05/15/2012  . ARF (acute renal failure) 05/15/2012  . Abdominal pain, acute, right upper quadrant 05/12/2012  . Nausea and vomiting 05/12/2012  . Dehydration 05/12/2012  . Cholecystitis chronic, acute, S/P lap cholecystectomy 05/14/12 05/12/2012  .  Thoracic aortic aneurysm, 4.8cm 2011   . CHF, past NICM with an EF of 30%, most recent echo 2011 showed EF to be45- 50%   . CAF not felt to be a Coumadin candidate in the past, rate currently controlled   . Anemia 08/09/2011  . History of colon cancer, stage III 08/09/2011  Recommend NPO/NGT/IVF Hopefully will resolve without surgery.  Medicine admit.  REPEAT FILMS am 9/13  Misako Roeder A. 04/03/2014, 3:07 AM

## 2014-04-03 NOTE — ED Provider Notes (Signed)
80 year old female with history of small bowel junction comes in with one-day history of epigastric pain and nausea and vomiting. Exam, there is epigastric tenderness and bowel sounds are decreased. CT shows small bowel obstruction and she will need to be admitted.  Medical screening examination/treatment/procedure(s) were conducted as a shared visit with non-physician practitioner(s) and myself.  I personally evaluated the patient during the encounter.   EKG Interpretation   Date/Time:  Friday April 02 2014 23:23:00 EDT Ventricular Rate:  115 PR Interval:    QRS Duration: 106 QT Interval:  393 QTC Calculation: 544 R Axis:   22 Text Interpretation:  Atrial fibrillation RSR' in V1 or V2, right VCD or  RVH Repol abnrm, severe global ischemia (LM/MVD) Prolonged QT interval No  significant change since last tracing Confirmed by Christy Gentles  MD, [place]  [phone]) on 04/02/2014 11:39:04 PM        Delora Fuel, MD 80/16/55 [id number]

## 2014-04-04 ENCOUNTER — Inpatient Hospital Stay (HOSPITAL_COMMUNITY): Payer: Medicare Other

## 2014-04-04 DIAGNOSIS — E1149 Type 2 diabetes mellitus with other diabetic neurological complication: Secondary | ICD-10-CM

## 2014-04-04 DIAGNOSIS — R1011 Right upper quadrant pain: Secondary | ICD-10-CM

## 2014-04-04 DIAGNOSIS — R52 Pain, unspecified: Secondary | ICD-10-CM

## 2014-04-04 DIAGNOSIS — I10 Essential (primary) hypertension: Secondary | ICD-10-CM

## 2014-04-04 DIAGNOSIS — I4891 Unspecified atrial fibrillation: Secondary | ICD-10-CM

## 2014-04-04 LAB — BASIC METABOLIC PANEL
Anion gap: 12 (ref 5–15)
BUN: 19 mg/dL (ref 6–23)
CALCIUM: 9.3 mg/dL (ref 8.4–10.5)
CO2: 33 meq/L — AB (ref 19–32)
CREATININE: 1.37 mg/dL — AB (ref 0.50–1.10)
Chloride: 101 mEq/L (ref 96–112)
GFR, EST AFRICAN AMERICAN: 41 mL/min — AB (ref >90)
GFR, EST NON AFRICAN AMERICAN: 35 mL/min — AB (ref >90)
Glucose, Bld: 133 mg/dL — ABNORMAL HIGH (ref 70–99)
Potassium: 3.5 mEq/L — ABNORMAL LOW (ref 3.7–5.3)
SODIUM: 146 meq/L (ref 137–147)

## 2014-04-04 LAB — PROTIME-INR
INR: 1.88 — AB (ref 0.00–1.49)
PROTHROMBIN TIME: 21.6 s — AB (ref 11.6–15.2)

## 2014-04-04 LAB — CBC
HEMATOCRIT: 34.3 % — AB (ref 36.0–46.0)
Hemoglobin: 11.8 g/dL — ABNORMAL LOW (ref 12.0–15.0)
MCH: 27.4 pg (ref 26.0–34.0)
MCHC: 34.4 g/dL (ref 30.0–36.0)
MCV: 79.6 fL (ref 78.0–100.0)
PLATELETS: 334 10*3/uL (ref 150–400)
RBC: 4.31 MIL/uL (ref 3.87–5.11)
RDW: 15.9 % — ABNORMAL HIGH (ref 11.5–15.5)
WBC: 11.5 10*3/uL — AB (ref 4.0–10.5)

## 2014-04-04 LAB — GLUCOSE, CAPILLARY
Glucose-Capillary: 126 mg/dL — ABNORMAL HIGH (ref 70–99)
Glucose-Capillary: 114 mg/dL — ABNORMAL HIGH (ref 70–99)
Glucose-Capillary: 108 mg/dL — ABNORMAL HIGH (ref 70–99)

## 2014-04-04 IMAGING — RF DG CHOLANGIOGRAM OPERATIVE
1 series · 6 of 6 positions shown · non-contrast
Comparison: Abdominal ultrasound - 05/12/2012

CLINICAL DATA: Laparoscopic cholecystectomy

INTRAOPERATIVE CHOLANGIOGRAM

[Series 1: run · 3 acquisitions, 6 frames shown]
[im 1/3]
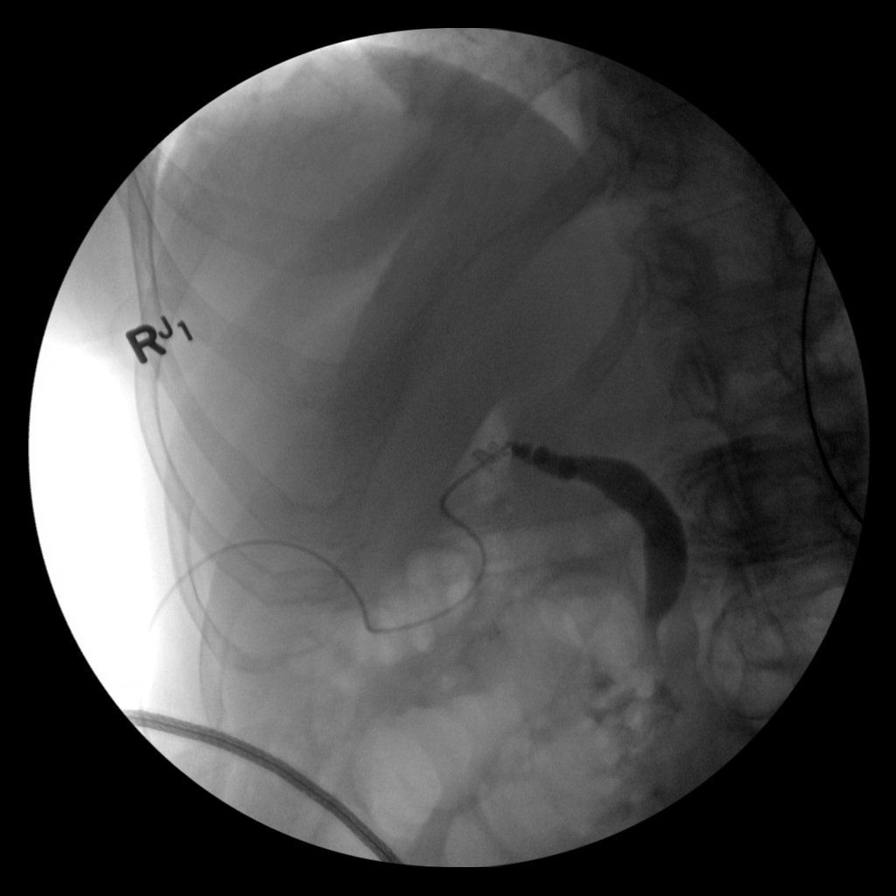
[im 1/3]
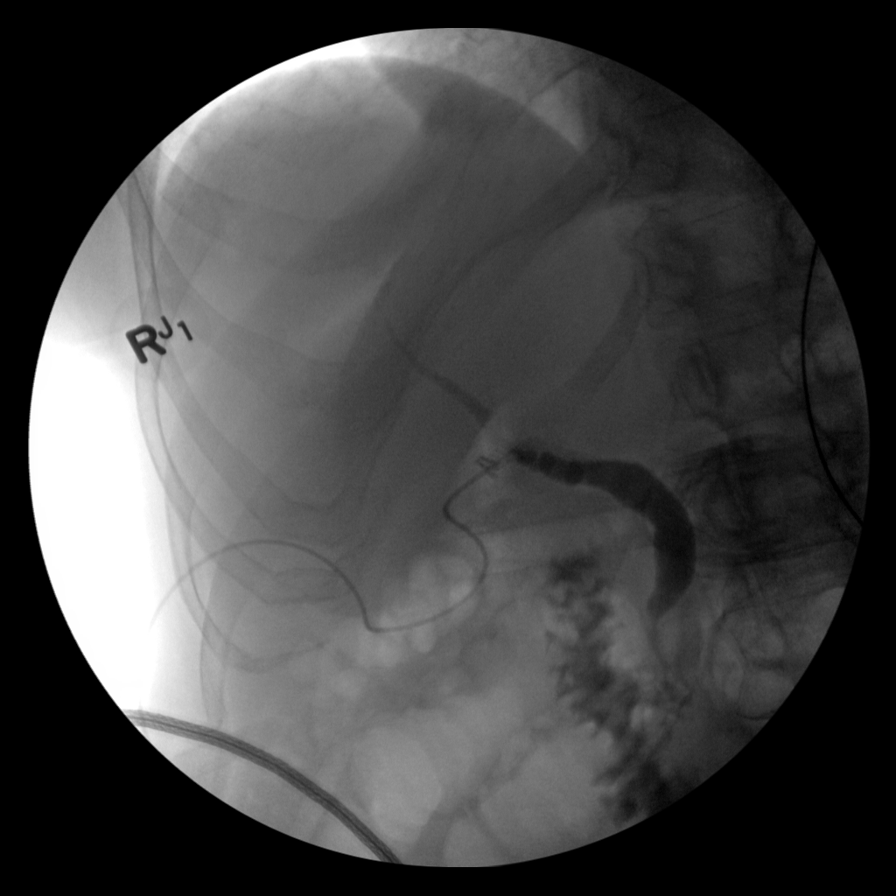
[im 1/3]
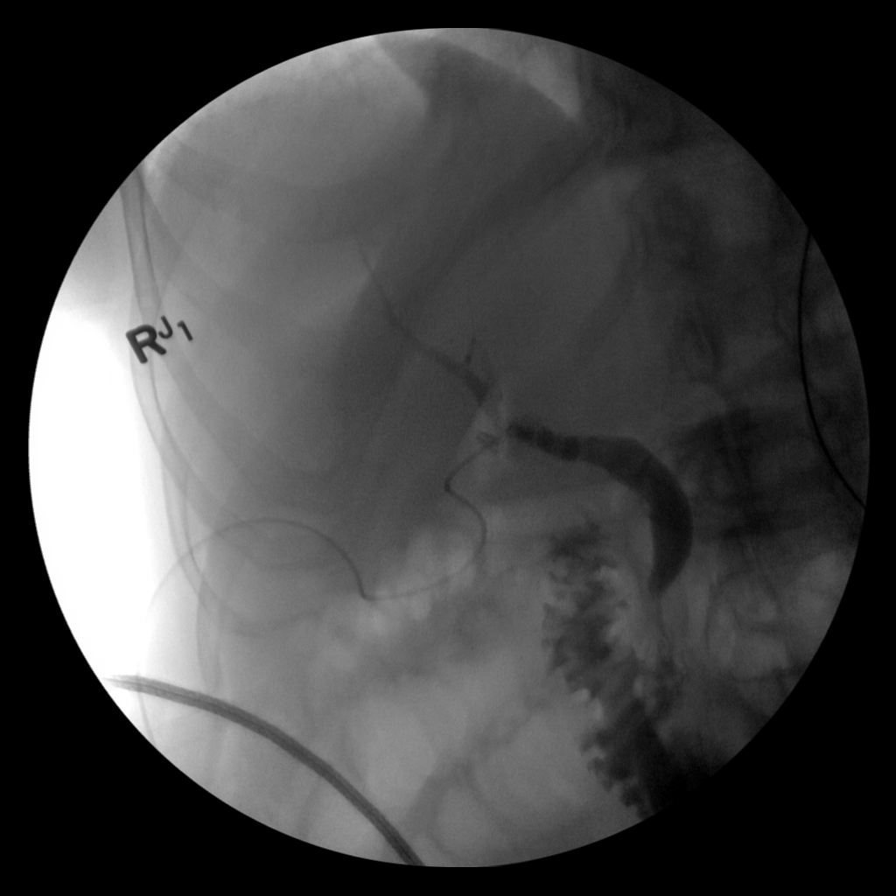
[im 1/3]
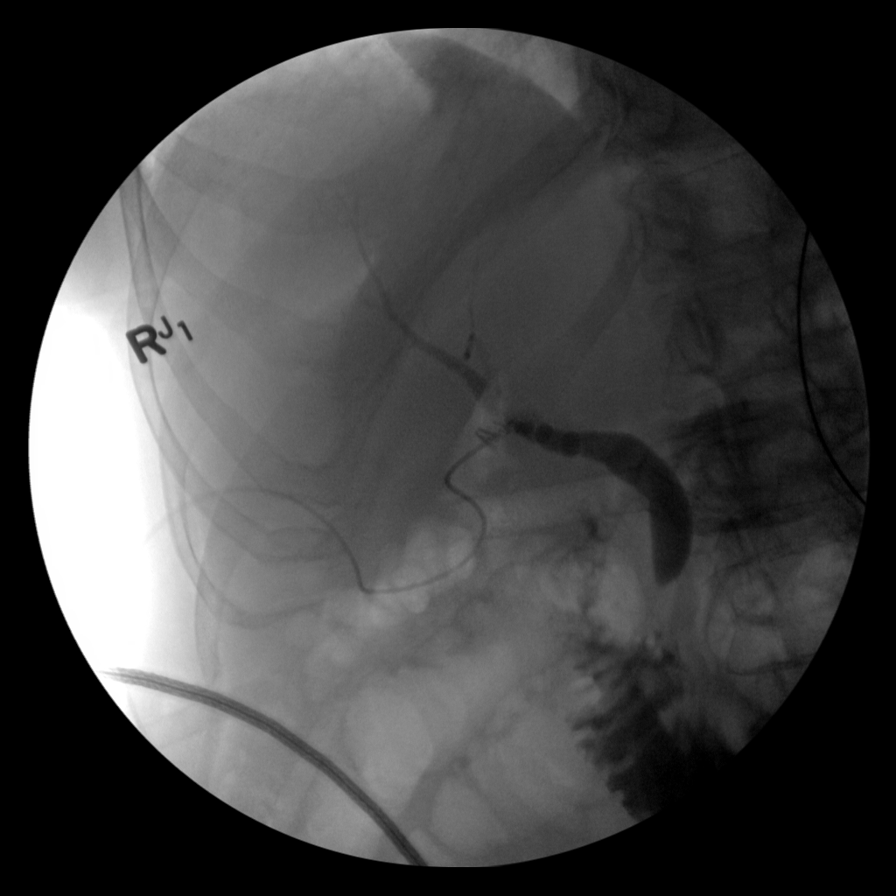
[im 2/3]
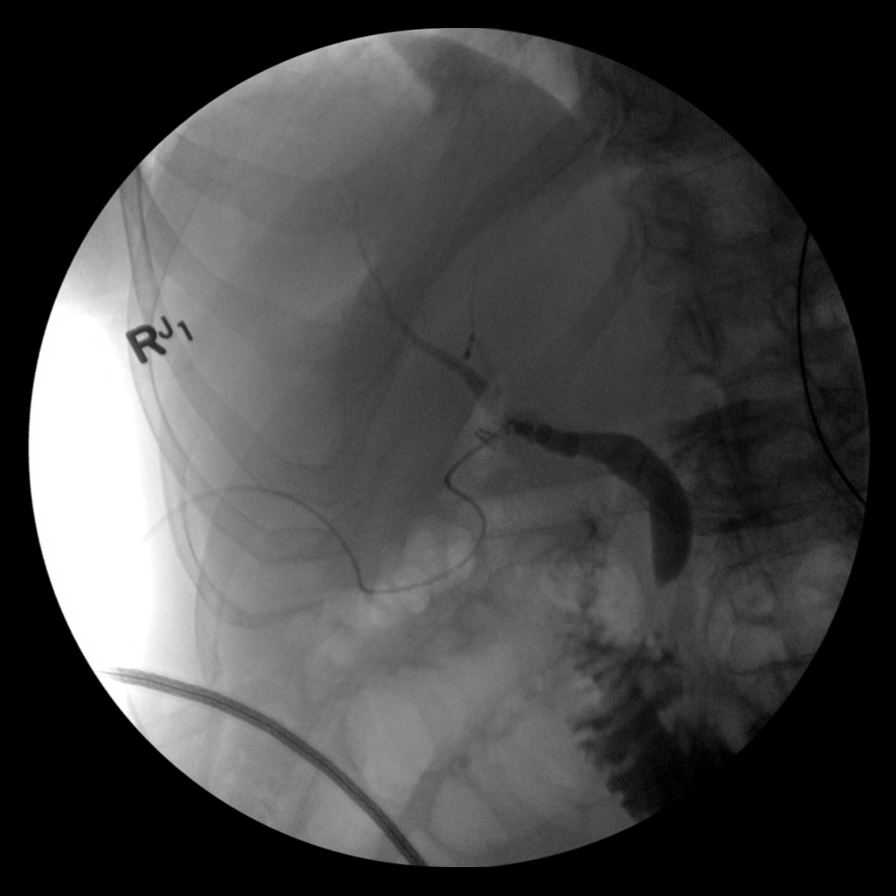
[im 3/3]
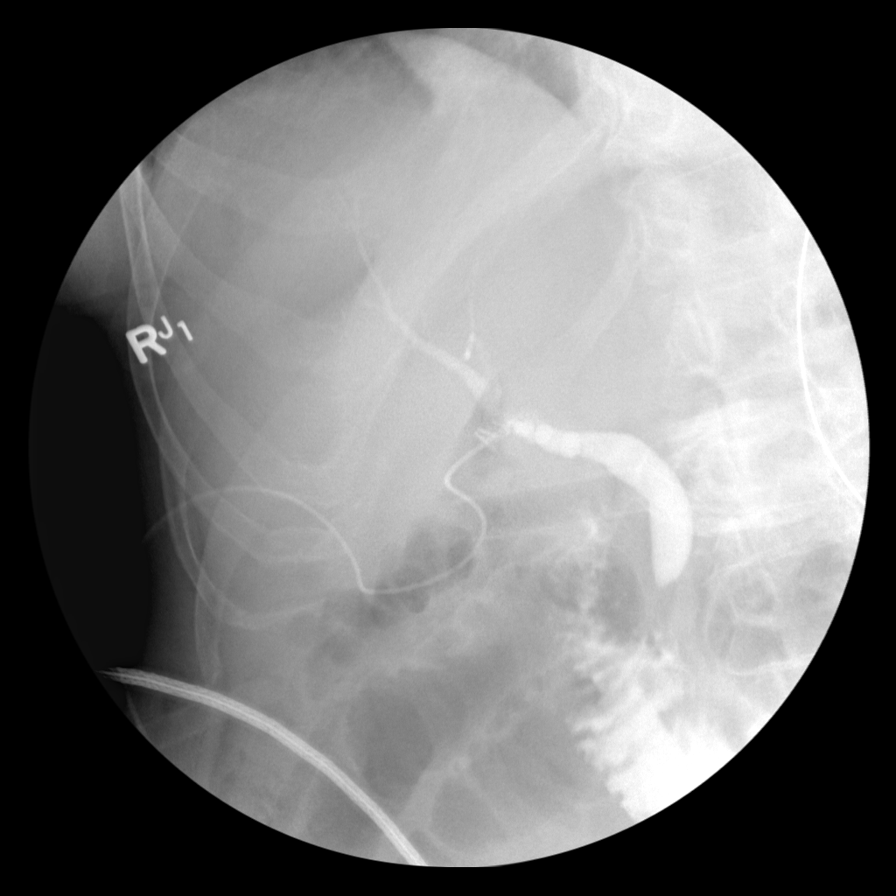

[6 of 6 positions shown; findings below may reference images not displayed]

FINDINGS: Intraoperative angiographic images of the right upper abdominal
quadrant during laparoscopic cholecystectomy are provided for
review.

Surgical clips overlie the expected location of the gallbladder
fossa.

Contrast injection demonstrates selective cannulation of the
central aspect of the cystic duct.

There is brisk passage of contrast through the central aspect of
the cystic duct with filling of a mildly dilated common bile duct.
There is brisk passage of contrast though the CBD and into the
descending portion of the duodenum.  There is minimal reflux of
injected contrast into the common hepatic duct and central aspect
of the nondilated intrahepatic biliary system.

There are no discrete filling defects within the opacified portions
of the biliary system to suggest the presence of
choledocholithiasis.
IMPRESSION: Intraoperative cholangiogram as above.  No discrete filling defects
to suggest the presence of choledocholithiasis.

## 2014-04-04 MED ORDER — ATORVASTATIN CALCIUM 20 MG PO TABS
20.0000 mg | ORAL_TABLET | Freq: Every day | ORAL | Status: DC
  Administered 2014-04-05 – 2014-04-07 (×3): 20 mg via ORAL
  Filled 2014-04-04 (×4): qty 1

## 2014-04-04 MED ORDER — PHENOL 1.4 % MT LIQD
1.0000 | OROMUCOSAL | Status: DC | PRN
  Administered 2014-04-04: 1 via OROMUCOSAL
  Filled 2014-04-04: qty 177

## 2014-04-04 MED ORDER — METOPROLOL TARTRATE 1 MG/ML IV SOLN
5.0000 mg | INTRAVENOUS | Status: DC | PRN
  Administered 2014-04-04: 5 mg via INTRAVENOUS
  Filled 2014-04-04: qty 5

## 2014-04-04 MED ORDER — METOPROLOL TARTRATE 1 MG/ML IV SOLN
INTRAVENOUS | Status: AC
  Administered 2014-04-04: 5 mg via INTRAVENOUS
  Filled 2014-04-04: qty 5

## 2014-04-04 NOTE — Progress Notes (Signed)
PT Cancellation Note  Patient Details Name: Nicole [name] MRN: [id number] DOB: June 29, 1931   Cancelled Treatment:    Reason Eval/Treat Not Completed: Medical issues which prohibited therapy, pt still with strict bedrest orders and nsg requested that PT be held for now. RN reports that they are helping pt mobilize to [facility] as needed at this point. Will check back tomorrow.   [place], [place] 04/04/2014, 11:17 AM

## 2014-04-04 NOTE — Progress Notes (Signed)
[facility] PROGRESS NOTE  Nicole [name] WLN:[id number] DOB: [birth year] DOA: 04/02/2014 PCP: Hollace Kinnier, DO  Assessment/Plan: 1. Probable small bowel obstruction -Patient with history of abdominal surgeries and recurrent small bowel obstructions, presenting with complaints of nausea vomiting abdominal pain and abdominal distention. -CT scan of abdomen and pelvis showing small bowel loops distended to 4.1 cm, with an apparent transition point at the mid ileum. Findings seem to be consistent with small bowel obstruction. -General surgery following -Attempt conservative management for now -Continue n.p.o. status, IV fluids, supportive care -On 04/04/2014 appears to be improving with a decrease in abdominal distention, less nauseated  2. Chronic systolic congestive heart failure. -Last transthoracic echocardiogram performed 12/04/2009 which showed an ejection fraction of 45-50% -She was made n.p.o. for small bowel obstruction, Lasix currently being held. -Carefully monitoring volume status at this point -Does not apear to be volume overloaded  3. Hypertension. -Patient hypertensive with systolic blood pressures in the 150's -Added as needed labetolol for SBP's greater than 165 or HR's greater than 90  4. Type 2 diabetes mellitus.  -Hemoglobin A1c 5.5 on 01/26/2014 -Hypoglycemic agents held as she is currently n.p.o.  5. Atrial fibrillation. -Presented with INR of 2.15, Coumadin held -Continue Metoprolol and Cardizem -PRN labetolol added.   Code Status: Full Code Family Communication: family updated Disposition Plan: Continue supportive care   Consultants:  General surgery   HPI/Subjective: Patient is a pleasant 80 year old female with a past medical history of hemicolectomy, recurrent small bowel obstructions, who presented overnight to the emergency room with complaints of abdominal pain, distention, nausea and vomiting. A CT scan of abdomen and pelvis performed in  the emergency department revealing findings suggestive of small bowel obstruction. General surgery was consulted. NG tube was placed. Patient currently n.p.o., Patient thinks she is feeling a little better today  Objective: Filed Vitals:   04/04/14 1300  BP: 151/93  Pulse: 89  Temp: 98.4 F (36.9 C)  Resp: 18    Intake/Output Summary (Last 24 hours) at 04/04/14 1626 Last data filed at 04/04/14 0810  Gross per 24 hour  Intake 729.17 ml  Output   1675 ml  Net -945.83 ml   Filed Weights   04/02/14 2205 04/03/14 0516 04/04/14 0601  Weight: 80.287 kg (177 lb) 79.2 kg (174 lb 9.7 oz) 77 kg (169 lb 12.1 oz)    Exam:   General:  She is awake and alert, following commands in NAD  Cardiovascular: Irregular rate and rhythm normal S1-S2  Respiratory: Normal respiratory effort , lungs are auscultation bilaterally   Abdomen: Her abdomen is less distended, nontender  Musculoskeletal: No edema  Data Reviewed: Basic Metabolic Panel:  Recent Labs Lab 04/02/14 2233 04/03/14 0549 04/04/14 0730  NA 141 140 146  K 4.0 4.1 3.5*  CL 99 99 101  CO2 27 30 33*  GLUCOSE 159* 268* 133*  BUN 19 19 19   CREATININE 1.41* 1.36* 1.37*  CALCIUM 9.5 9.4 9.3   Liver Function Tests:  Recent Labs Lab 04/02/14 2233 04/03/14 0549  AST 16 25  ALT 12 13  ALKPHOS 109 108  BILITOT 0.5 0.6  PROT 7.6 6.8  ALBUMIN 3.8 3.4*    Recent Labs Lab 04/02/14 2233  LIPASE 47   No results found for this basename: AMMONIA,  in the last 168 hours CBC:  Recent Labs Lab 04/02/14 2233 04/03/14 0549 04/04/14 0730  WBC 11.6* 16.8* 11.5*  NEUTROABS 9.2*  --   --   HGB 12.2 11.7* 11.8*  HCT 35.0* 34.0* 34.3*  MCV 78.3 79.1 79.6  PLT 335 325 334   Cardiac Enzymes: No results found for this basename: CKTOTAL, CKMB, CKMBINDEX, TROPONINI,  in the last 168 hours BNP (last 3 results)  Recent Labs  04/03/14 0549  PROBNP 1015.0*   CBG:  Recent Labs Lab 04/03/14 0736 04/03/14 1142  04/03/14 1704 04/03/14 2104 04/04/14 1108  GLUCAP 222* 172* 116* 133* 126*    No results found for this or any previous visit (from the past 240 hour(s)).   Studies: Ct Abdomen Pelvis Wo Contrast  04/03/2014   CLINICAL DATA:  Severe abdominal pain, nausea and vomiting.  EXAM: CT ABDOMEN AND PELVIS WITHOUT CONTRAST  TECHNIQUE: Multidetector CT imaging of the abdomen and pelvis was performed following the standard protocol without IV contrast.  COMPARISON:  CT of the abdomen and pelvis from 01/23/2014  FINDINGS: Minimal bibasilar atelectasis is noted.  The liver is unremarkable in appearance. Splenosis is noted at the left upper quadrant. The patient is status post cholecystectomy, with clips noted along the gallbladder fossa. The pancreas and adrenal glands are unremarkable.  Mild bilateral renal atrophy and left renal scarring are noted. The kidneys are otherwise unremarkable. Mild nonspecific perinephric stranding is noted bilaterally. There is no evidence of hydronephrosis. No renal or ureteral stones are seen.  There is distention of small-bowel loops to 4.1 cm in maximal diameter, with gradual dilution of contrast to the level of an apparent transition point at the mid ileum at the left lower quadrant. There is fecalization of the ileum just proximal to the transition point. This raises question for partial small bowel obstruction, possibly due to an adhesion.  More distal small bowel loops are mostly decompressed, though trace air is still seen within distal small bowel loops. No free fluid is identified. The stomach is within normal limits. No acute vascular abnormalities are seen. Scattered calcification is seen along the abdominal aorta and its branches.  The appendix is normal in caliber and contains air, without evidence for appendicitis. Scattered diverticulosis is noted along the ascending and proximal transverse colon. A bowel suture line is noted along the descending colon. Minimal  diverticulosis is seen along the sigmoid colon. The colon is otherwise unremarkable.  The bladder is mildly distended and grossly unremarkable. The patient is status post hysterectomy. No suspicious adnexal masses are seen. The ovaries are relatively symmetric. No inguinal lymphadenopathy is seen.  No acute osseous abnormalities are identified.  IMPRESSION: 1. Distention of small-bowel loops to 4.1 cm in maximal diameter, with gradual dilution of contrast and an apparent transition point at the mid ileum at the left lower quadrant. There is fecalization of the ileum just proximal to the transition point, raising concern for partial small-bowel obstruction, possibly due to an adhesion. More distal small bowel loops are mostly decompressed, though trace air is still seen within distal small bowel loops. 2. Scattered calcification along the abdominal aorta and its branches. 3. Scattered diverticulosis along the ascending and proximal transverse colon. Minimal diverticulosis along the sigmoid colon. 4. Splenosis noted at the left upper quadrant. 5. Mild bilateral renal atrophy and left renal scarring noted.   Electronically Signed   By: Garald Balding M.D.   On: 04/03/2014 02:44   Dg Abd 1 View  04/04/2014   CLINICAL DATA:  Bowel obstruction.  EXAM: ABDOMEN - 1 VIEW  COMPARISON:  04/03/2014.  FINDINGS: Gas, stool and residual oral contrast material are noted throughout the colon extending to the level of the  distal rectum. There are a few prominent but nondilated loops of gas-filled small bowel throughout the central abdomen, measuring up to 3.7 cm in diameter, which are nonspecific. No pathologic dilatation of small bowel. No gross evidence of pneumoperitoneum. Nasogastric tube is present with tip terminating in the mid stomach. Surgical clips project over the right upper quadrant of the abdomen, compatible with prior cholecystectomy.  IMPRESSION: 1. Nonspecific, nonobstructive bowel gas pattern. 2. No  pneumoperitoneum.   Electronically Signed   By: Vinnie Langton M.D.   On: 04/04/2014 11:31   Dg Abd Portable 1v  04/03/2014   CLINICAL DATA:  Check placement of nasogastric tube.  EXAM: PORTABLE ABDOMEN - 1 VIEW  COMPARISON:  Abdominal radiograph performed 01/28/2014, and CT of the abdomen and pelvis performed earlier today at 2:31 a.m.  FINDINGS: The enteric tube is noted ending at the body of the stomach.  The visualized bowel gas pattern is unremarkable. Scattered air and stool filled loops of colon are seen; no abnormal dilatation of small bowel loops is seen to suggest small bowel obstruction. No free intra-abdominal air is identified, though evaluation for free air is limited on a single supine view. Clips are noted within the right upper quadrant, reflecting prior cholecystectomy.  The visualized osseous structures are within normal limits; the sacroiliac joints are unremarkable in appearance. The visualized lung bases are essentially clear.  IMPRESSION: Enteric tube noted ending at the body of the stomach.   Electronically Signed   By: Garald Balding M.D.   On: 04/03/2014 06:54    Scheduled Meds: . diltiazem  240 mg Oral Daily  . insulin aspart  0-9 Units Subcutaneous TID WC  . levothyroxine  50 mcg Oral QAC breakfast  . magnesium oxide  400 mg Oral BID  . metoprolol  25 mg Oral BID  . simvastatin  40 mg Oral Daily  . sodium chloride  3 mL Intravenous Q12H  . Warfarin - Pharmacist Dosing Inpatient   Does not apply q1800   Continuous Infusions: . sodium chloride 50 mL/hr at 04/03/14 1927    Principal Problem:   SOB (shortness of breath) Active Problems:   History of colon cancer, stage III   CHF, past NICM with an EF of 30%, most recent echo 2011 showed EF to be45- 50%   Type II or unspecified type diabetes mellitus with neurological manifestations, not stated as uncontrolled   HTN (hypertension)   Hypothyroidism    Time spent: 35 min    Kelvin Cellar  [facility] Pager [phone]. If 7PM-7AM, please contact night-coverage at [email or web address], password [facility] 04/04/2014, 4:26 PM  LOS: 2 days

## 2014-04-04 NOTE — Progress Notes (Signed)
ANTICOAGULATION CONSULT NOTE - Follow Up Consult  Pharmacy Consult for Coumadin Indication: atrial fibrillation  Allergies  Allergen Reactions  . Iohexol      Code: VOM, Desc: PT REPORTS VOMITING W/ IVP DYE- ARS 12/26/08, Onset Date: [id number]   . Z-Pak [Azithromycin]   . Iodinated Diagnostic Agents Nausea Only    PT HAS HAD SINGULAR INCIDENT OF NAUSEA  W/ IV CONTYRAST INJECTION, NONE IF INJECTED SLOWLY//A.[place]    Patient Measurements: Height: 5\' 9"  (175.3 cm) Weight: 169 lb 12.1 oz (77 kg) IBW/kg (Calculated) : 66.2 Heparin Dosing Weight:   Vital Signs: Temp: 98.4 F (36.9 C) (09/13 1300) Temp src: Oral (09/13 1300) BP: 151/93 mmHg (09/13 1300) Pulse Rate: 89 (09/13 1300)  Labs:  Recent Labs  04/02/14 2233 04/02/14 2324 04/03/14 0549 04/04/14 0730  HGB 12.2  --  11.7* 11.8*  HCT 35.0*  --  34.0* 34.3*  PLT 335  --  325 334  LABPROT  --  24.0*  --  21.6*  INR  --  2.15*  --  1.88*  CREATININE 1.41*  --  1.36* 1.37*    Estimated Creatinine Clearance: 33.7 ml/min (by C-G formula based on Cr of 1.37).   Medications:  Scheduled:  . diltiazem  240 mg Oral Daily  . insulin aspart  0-9 Units Subcutaneous TID WC  . levothyroxine  50 mcg Oral QAC breakfast  . magnesium oxide  400 mg Oral BID  . metoprolol  25 mg Oral BID  . simvastatin  40 mg Oral Daily  . sodium chloride  3 mL Intravenous Q12H  . Warfarin - Pharmacist Dosing Inpatient   Does not apply q1800    Assessment: 80yo female with AFib.  INR down some to 1.88 this AM; pt missed dose on 9/11 (pta).  Hg is stable and pltc wnl.  No bleeding noted.  Goal of Therapy:  INR 2-3 Monitor platelets by anticoagulation protocol: Yes   Plan:  1-  Coumadin 5mg  today (usual dose would be 2.5mg ) 2-  Plan to resume home dose 9/14 if INR improves.  Gracy Bruins, PharmD Clinical Pharmacist [facility]

## 2014-04-04 NOTE — Progress Notes (Signed)
Subjective: Feels ok reading paper some flatus pain not severe no BM  Objective: Vital signs in last 24 hours: Temp:  [98.2 F (36.8 C)-99 F (37.2 C)] 99 F (37.2 C) (09/13 0601) Pulse Rate:  [67-107] 67 (09/13 0601) Resp:  [12-18] 18 (09/13 0601) BP: (138-178)/(80-102) 149/95 mmHg (09/13 0601) SpO2:  [95 %-98 %] 98 % (09/13 0601) Weight:  [169 lb 12.1 oz (77 kg)] 169 lb 12.1 oz (77 kg) (09/13 0601) Last BM Date: 04/02/14  Intake/Output from previous day: 09/12 0701 - 09/13 0700 In: 729.2 [P.O.:60; I.V.:669.2] Out: 2325 [Urine:800; Emesis/NG output:1525] Intake/Output this shift: Total I/O In: -  Out: 850 [Urine:150; Emesis/NG output:700]  GI: OBESE SOFT MIN TENDERNESS MILD DISTENTION NO PERITONITIIS  Lab Results:   Recent Labs  04/02/14 2233 04/03/14 0549  WBC 11.6* 16.8*  HGB 12.2 11.7*  HCT 35.0* 34.0*  PLT 335 325   BMET  Recent Labs  04/02/14 2233 04/03/14 0549  NA 141 140  K 4.0 4.1  CL 99 99  CO2 27 30  GLUCOSE 159* 268*  BUN 19 19  CREATININE 1.41* 1.36*  CALCIUM 9.5 9.4   PT/INR  Recent Labs  04/02/14 2324  LABPROT 24.0*  INR 2.15*   ABG No results found for this basename: PHART, PCO2, PO2, HCO3,  in the last 72 hours  Studies/Results: Ct Abdomen Pelvis Wo Contrast  04/03/2014   CLINICAL DATA:  Severe abdominal pain, nausea and vomiting.  EXAM: CT ABDOMEN AND PELVIS WITHOUT CONTRAST  TECHNIQUE: Multidetector CT imaging of the abdomen and pelvis was performed following the standard protocol without IV contrast.  COMPARISON:  CT of the abdomen and pelvis from 01/23/2014  FINDINGS: Minimal bibasilar atelectasis is noted.  The liver is unremarkable in appearance. Splenosis is noted at the left upper quadrant. The patient is status post cholecystectomy, with clips noted along the gallbladder fossa. The pancreas and adrenal glands are unremarkable.  Mild bilateral renal atrophy and left renal scarring are noted. The kidneys are otherwise  unremarkable. Mild nonspecific perinephric stranding is noted bilaterally. There is no evidence of hydronephrosis. No renal or ureteral stones are seen.  There is distention of small-bowel loops to 4.1 cm in maximal diameter, with gradual dilution of contrast to the level of an apparent transition point at the mid ileum at the left lower quadrant. There is fecalization of the ileum just proximal to the transition point. This raises question for partial small bowel obstruction, possibly due to an adhesion.  More distal small bowel loops are mostly decompressed, though trace air is still seen within distal small bowel loops. No free fluid is identified. The stomach is within normal limits. No acute vascular abnormalities are seen. Scattered calcification is seen along the abdominal aorta and its branches.  The appendix is normal in caliber and contains air, without evidence for appendicitis. Scattered diverticulosis is noted along the ascending and proximal transverse colon. A bowel suture line is noted along the descending colon. Minimal diverticulosis is seen along the sigmoid colon. The colon is otherwise unremarkable.  The bladder is mildly distended and grossly unremarkable. The patient is status post hysterectomy. No suspicious adnexal masses are seen. The ovaries are relatively symmetric. No inguinal lymphadenopathy is seen.  No acute osseous abnormalities are identified.  IMPRESSION: 1. Distention of small-bowel loops to 4.1 cm in maximal diameter, with gradual dilution of contrast and an apparent transition point at the mid ileum at the left lower quadrant. There is fecalization of the ileum just  proximal to the transition point, raising concern for partial small-bowel obstruction, possibly due to an adhesion. More distal small bowel loops are mostly decompressed, though trace air is still seen within distal small bowel loops. 2. Scattered calcification along the abdominal aorta and its branches. 3. Scattered  diverticulosis along the ascending and proximal transverse colon. Minimal diverticulosis along the sigmoid colon. 4. Splenosis noted at the left upper quadrant. 5. Mild bilateral renal atrophy and left renal scarring noted.   Electronically Signed   By: Garald Balding M.D.   On: 04/03/2014 02:44   Dg Abd Portable 1v  04/03/2014   CLINICAL DATA:  Check placement of nasogastric tube.  EXAM: PORTABLE ABDOMEN - 1 VIEW  COMPARISON:  Abdominal radiograph performed 01/28/2014, and CT of the abdomen and pelvis performed earlier today at 2:31 a.m.  FINDINGS: The enteric tube is noted ending at the body of the stomach.  The visualized bowel gas pattern is unremarkable. Scattered air and stool filled loops of colon are seen; no abnormal dilatation of small bowel loops is seen to suggest small bowel obstruction. No free intra-abdominal air is identified, though evaluation for free air is limited on a single supine view. Clips are noted within the right upper quadrant, reflecting prior cholecystectomy.  The visualized osseous structures are within normal limits; the sacroiliac joints are unremarkable in appearance. The visualized lung bases are essentially clear.  IMPRESSION: Enteric tube noted ending at the body of the stomach.   Electronically Signed   By: Garald Balding M.D.   On: 04/03/2014 06:54    Anti-infectives: Anti-infectives   None      Assessment/Plan: pSBO Patient Active Problem List   Diagnosis Date Noted  . SOB (shortness of breath) 04/03/2014  . Epistaxis 02/15/2014  . SBO (small bowel obstruction) 01/23/2014  . Diabetic nephropathy 09/07/2013  . Chronic constipation 03/25/2013  . Carpal tunnel syndrome 02/25/2013  . Hypothyroidism   . Gout flare 06/01/2012  . Leakage of common bile duct s/p ERCP / stent 05/30/2012  . Ileus post op. still NPO 05/22/2012  . Bile leak, postoperative 05/17/2012  . Hypokalemia 05/17/2012  . Atrial fibrillation with RVR 05/16/2012  . Postoperative ileus  05/16/2012  . Small bowel obstruction due to adhesions 05/16/2012  . Acute on chronic renal insufficiency 05/15/2012  . Normal coronary arteries, 2008 05/15/2012  . Type II or unspecified type diabetes mellitus with neurological manifestations, not stated as uncontrolled 05/15/2012  . HTN (hypertension) 05/15/2012  . Respiratory distress post op secondary to rapid AF 05/15/2012  . ARF (acute renal failure) 05/15/2012  . Abdominal pain, acute, right upper quadrant 05/12/2012  . Nausea and vomiting 05/12/2012  . Dehydration 05/12/2012  . Cholecystitis chronic, acute, S/P lap cholecystectomy 05/14/12 05/12/2012  . Thoracic aortic aneurysm, 4.8cm 2011   . CHF, past NICM with an EF of 30%, most recent echo 2011 showed EF to be45- 50%   . CAF not felt to be a Coumadin candidate in the past, rate currently controlled   . Anemia 08/09/2011  . History of colon cancer, stage III 08/09/2011    EXAM benign recheck WBC and KUB Passing some gas and NGT output down Repeat films and labs in am  No acute surgical need   LOS: 2 days    Rosalia Mcavoy A. 04/04/2014

## 2014-04-05 DIAGNOSIS — Z7901 Long term (current) use of anticoagulants: Secondary | ICD-10-CM

## 2014-04-05 DIAGNOSIS — Z5181 Encounter for therapeutic drug level monitoring: Secondary | ICD-10-CM

## 2014-04-05 LAB — BASIC METABOLIC PANEL
Anion gap: 13 (ref 5–15)
BUN: 14 mg/dL (ref 6–23)
CHLORIDE: 104 meq/L (ref 96–112)
CO2: 29 mEq/L (ref 19–32)
CREATININE: 1.12 mg/dL — AB (ref 0.50–1.10)
Calcium: 9 mg/dL (ref 8.4–10.5)
GFR calc non Af Amer: 45 mL/min — ABNORMAL LOW (ref >90)
GFR, EST AFRICAN AMERICAN: 52 mL/min — AB (ref >90)
Glucose, Bld: 97 mg/dL (ref 70–99)
Potassium: 3.1 mEq/L — ABNORMAL LOW (ref 3.7–5.3)
SODIUM: 146 meq/L (ref 137–147)

## 2014-04-05 LAB — PROTIME-INR
INR: 1.8 — ABNORMAL HIGH (ref 0.00–1.49)
PROTHROMBIN TIME: 20.9 s — AB (ref 11.6–15.2)

## 2014-04-05 LAB — CBC
HCT: 34.1 % — ABNORMAL LOW (ref 36.0–46.0)
Hemoglobin: 11.5 g/dL — ABNORMAL LOW (ref 12.0–15.0)
MCH: 27.3 pg (ref 26.0–34.0)
MCHC: 33.7 g/dL (ref 30.0–36.0)
MCV: 80.8 fL (ref 78.0–100.0)
Platelets: 317 10*3/uL (ref 150–400)
RBC: 4.22 MIL/uL (ref 3.87–5.11)
RDW: 16.2 % — AB (ref 11.5–15.5)
WBC: 10 10*3/uL (ref 4.0–10.5)

## 2014-04-05 LAB — GLUCOSE, CAPILLARY
GLUCOSE-CAPILLARY: 79 mg/dL (ref 70–99)
Glucose-Capillary: 105 mg/dL — ABNORMAL HIGH (ref 70–99)
Glucose-Capillary: 98 mg/dL (ref 70–99)
Glucose-Capillary: 87 mg/dL (ref 70–99)

## 2014-04-05 IMAGING — CR DG ABDOMEN ACUTE W/ 1V CHEST
3 series · 3 of 3 positions shown · non-contrast
Comparison: 05/15/2012 chest radiograph.

CLINICAL DATA: Tympanitic abdomen.  Postoperative abdomen.
Hypoactive bowel sounds.  Abdominal pain.

ACUTE ABDOMEN SERIES (ABDOMEN 2 VIEW & CHEST 1 VIEW)

[w chest pa]
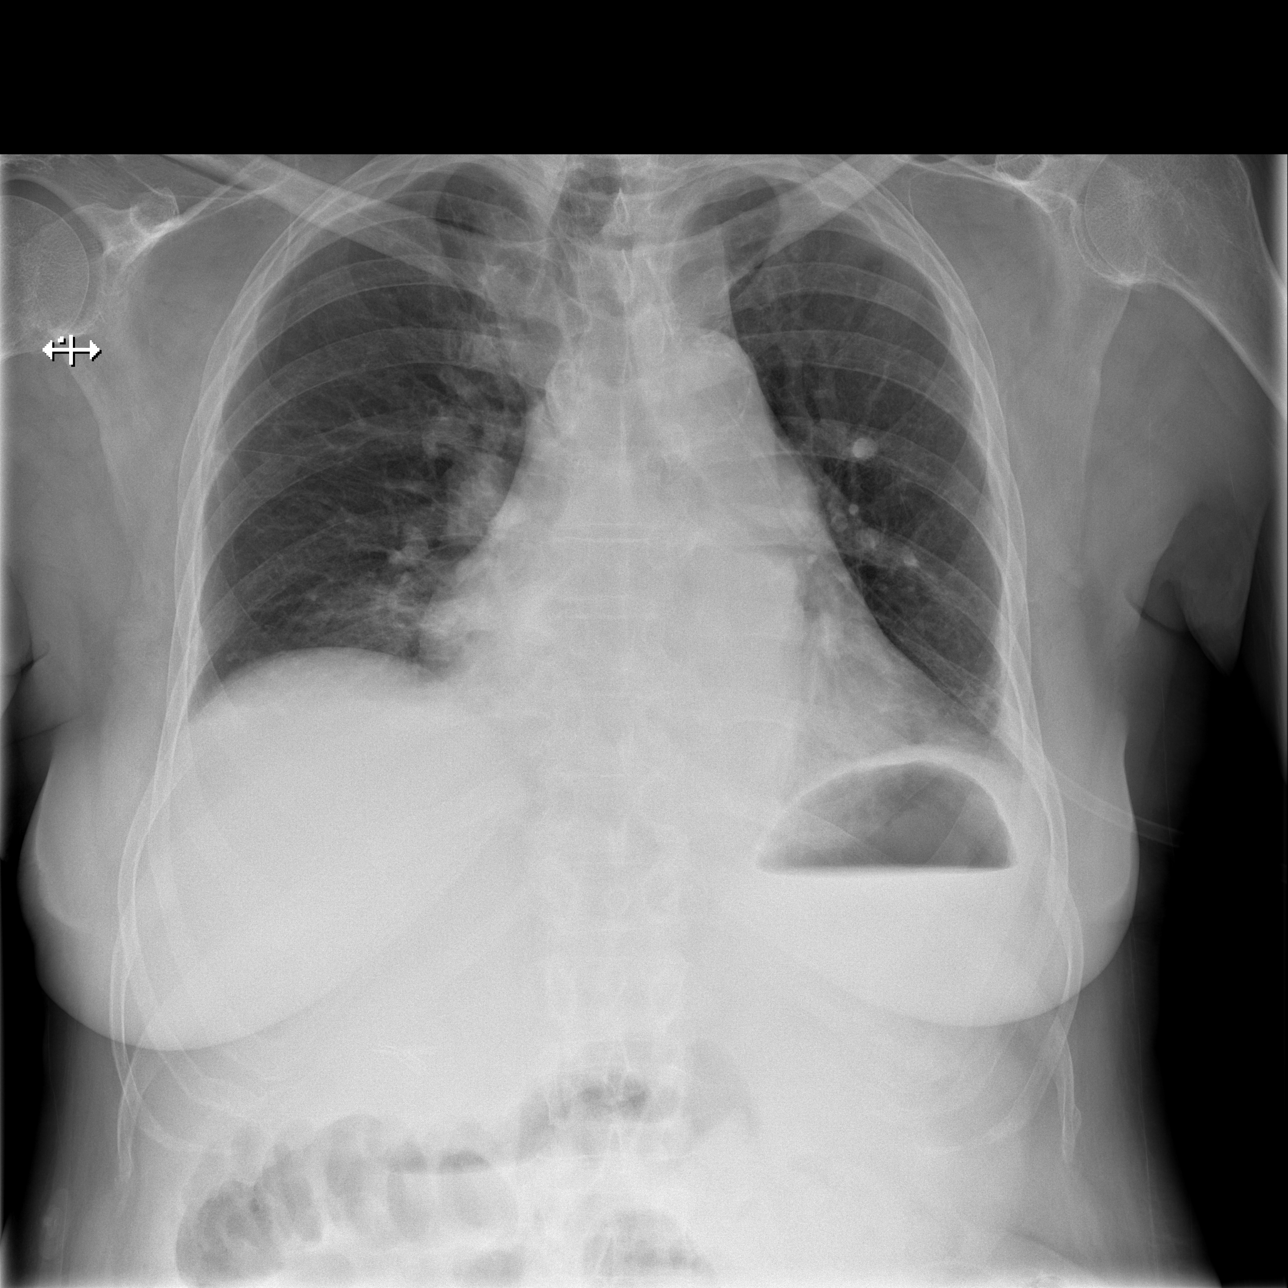

[w abdomen upright]
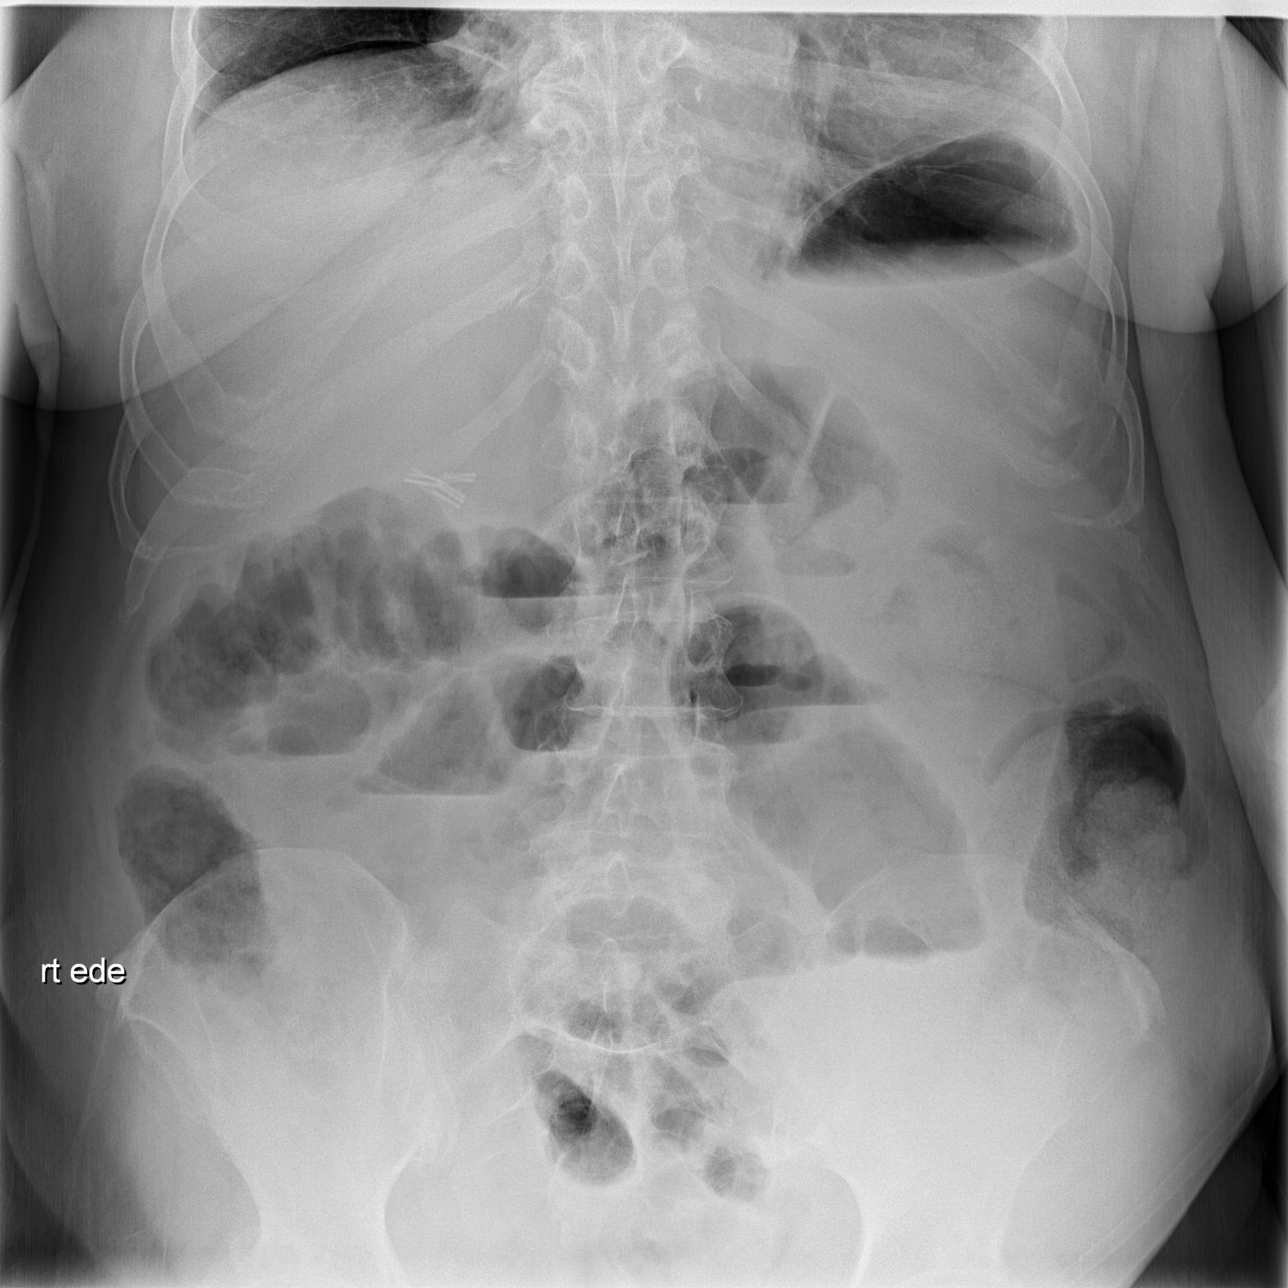

[t abdomen supine]
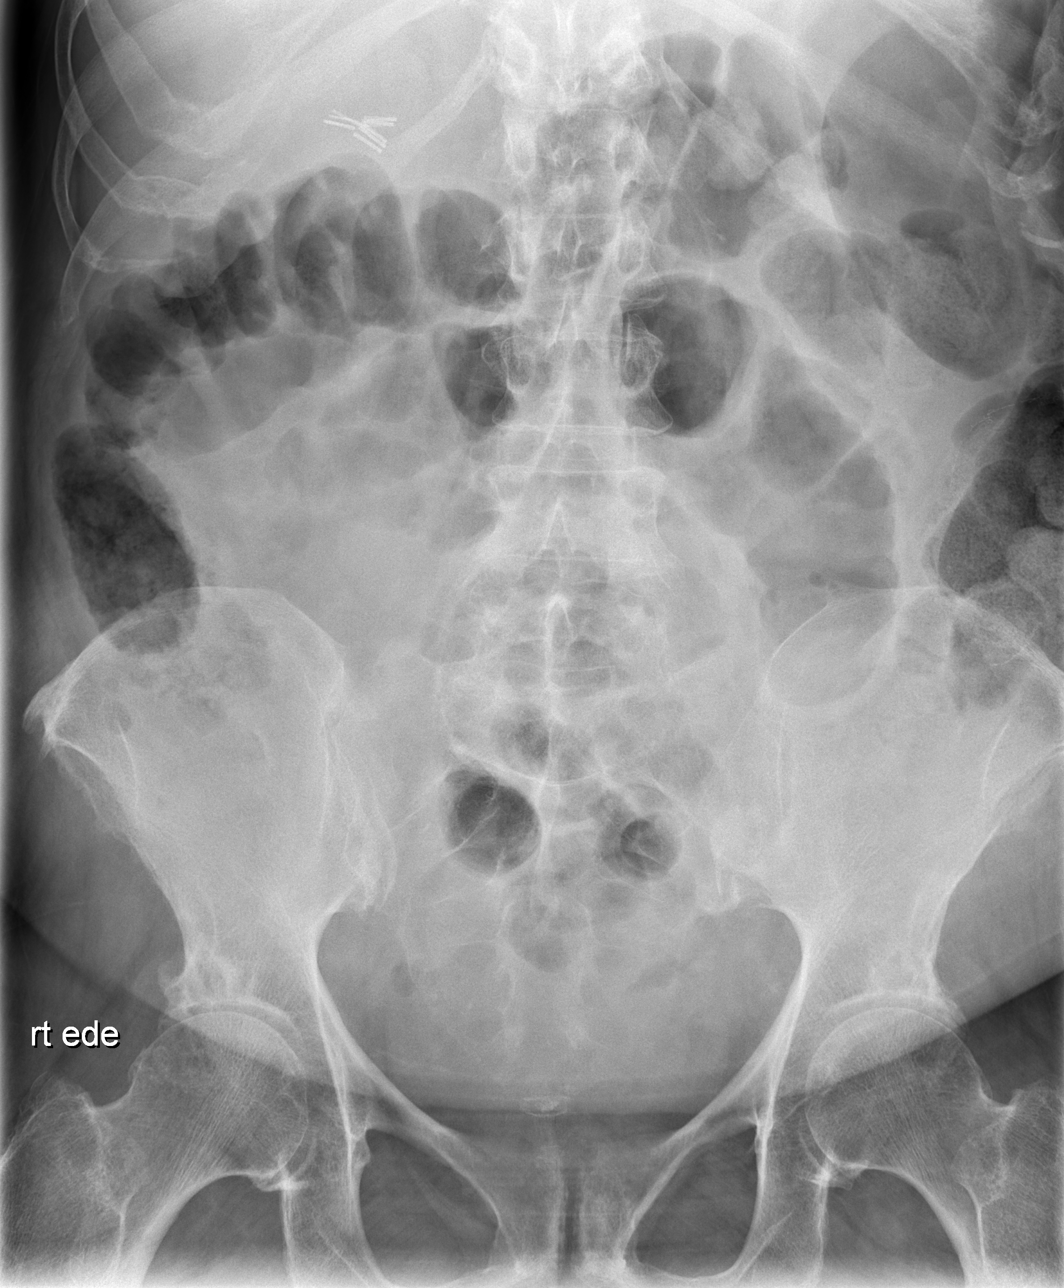

[3 of 3 positions shown; findings below may reference images not displayed]

FINDINGS: Low lung volumes are present with basilar atelectasis.
Cardiopericardial silhouette is unchanged, likely within normal
limits allowing for volumes of inspiration.

Cholecystectomy clips are present in the right upper quadrant.  No
free air is identified in the abdomen.  Small and large bowel
dilation is present with scattered air fluid levels most compatible
with ileus in the postoperative setting.  Paucity of rectal gas.
Gas and stool is present along the descending colon.  Bilateral
right greater than left hip osteoarthritis is incidentally noted.
Right sacroiliac joint degenerative disease.
IMPRESSION: 1.  Low volume chest.
2.  Cholecystectomy.
3.  Bowel gas pattern compatible with ileus.

## 2014-04-05 IMAGING — CR DG CHEST 1V PORT
1 series · 1 of 1 positions shown · non-contrast
Comparison: Portable exam 2277 hours compared to 12/02/2009

CLINICAL DATA: Hypoxia, CHF, lower abdominal and right shoulder
pain, 2 days post cholecystectomy, history hypertension, CHF,
atrial fibrillation, diabetes

PORTABLE CHEST - 1 VIEW

[AP]
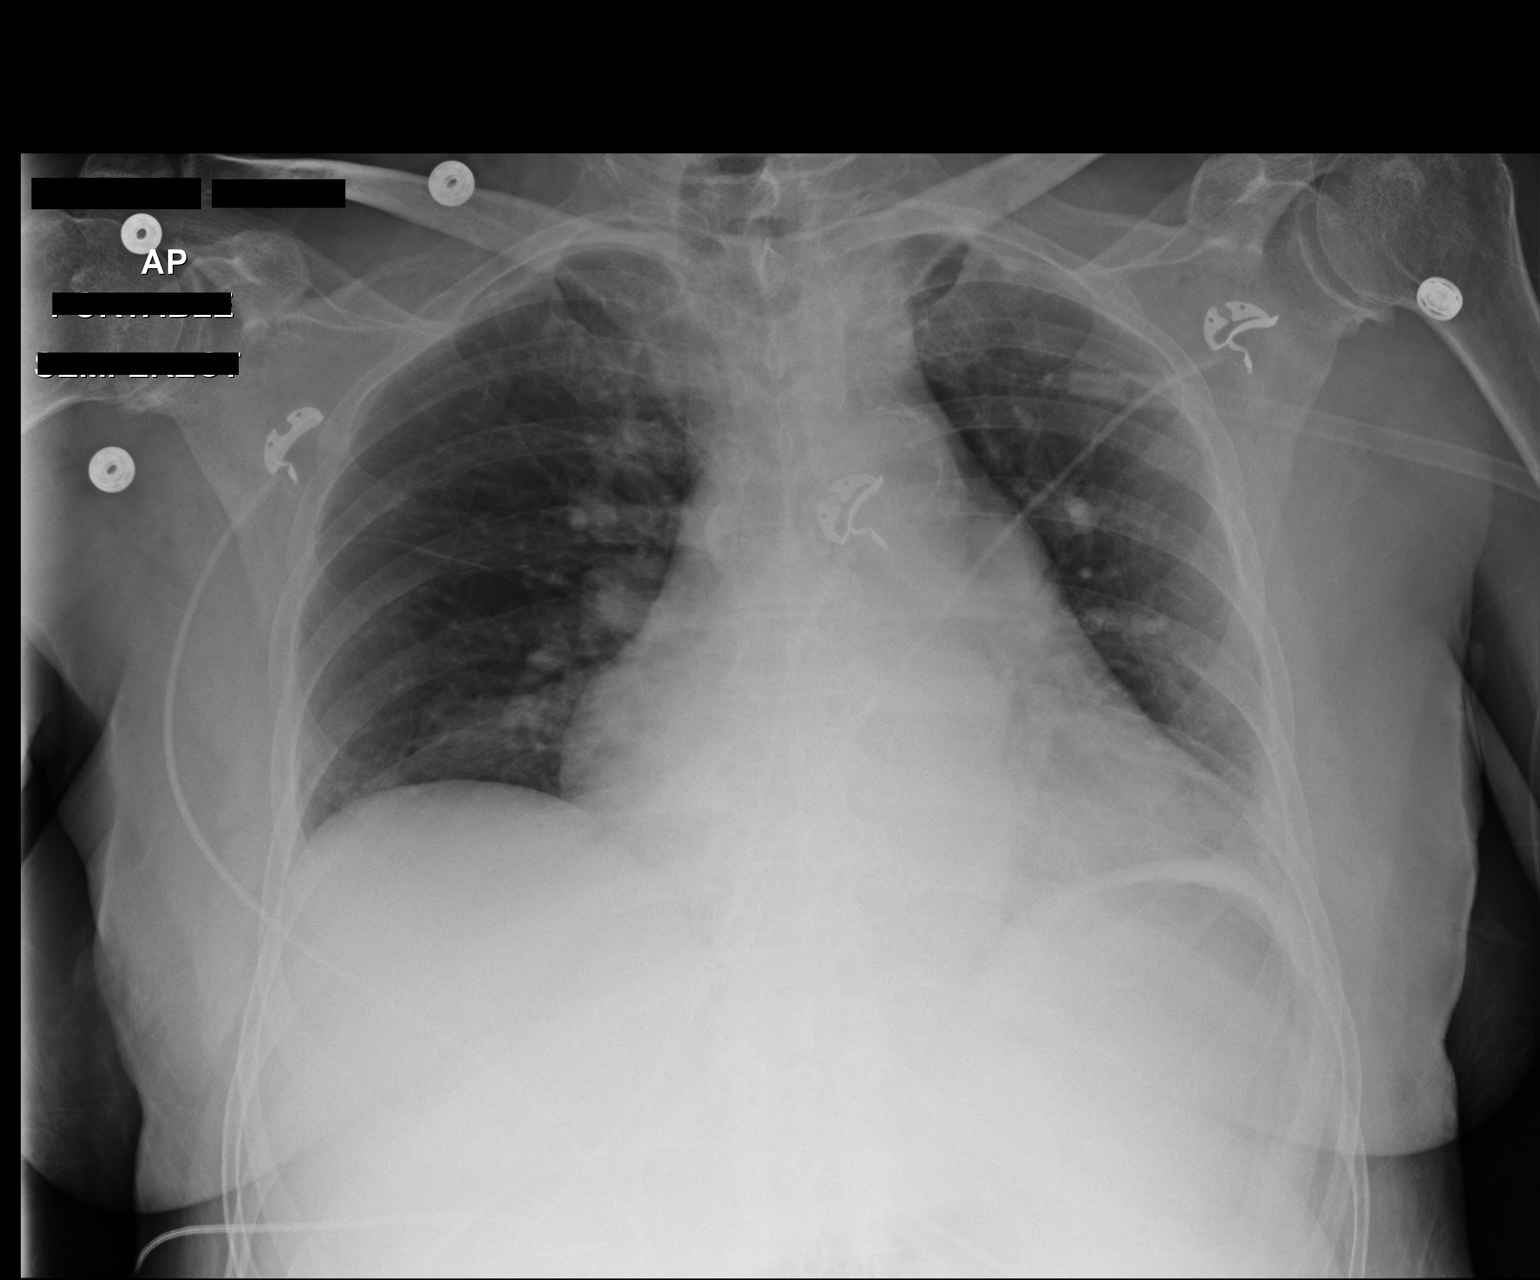

[1 of 1 positions shown; findings below may reference images not displayed]

FINDINGS: Enlargement of cardiac silhouette with pulmonary vascular
congestion.
Atherosclerotic calcification aorta.
Minimal bibasilar atelectasis.
Lungs otherwise clear.
Bones demineralized.
No pleural effusion or pneumothorax.
IMPRESSION: Enlargement of cardiac silhouette with pulmonary vascular
congestion.
Minimal bibasilar atelectasis.

## 2014-04-05 IMAGING — US US RENAL
1 series · 14 of 25 positions shown · non-contrast
Comparison: Abdominal ultrasound 05/12/2012, 11/08/2006 CT

CLINICAL DATA: Acute renal failure

RENAL/URINARY TRACT ULTRASOUND COMPLETE

[Series 1: us renal · 0.31mm/px · 14 of 39 slices shown]
[im 1/39]
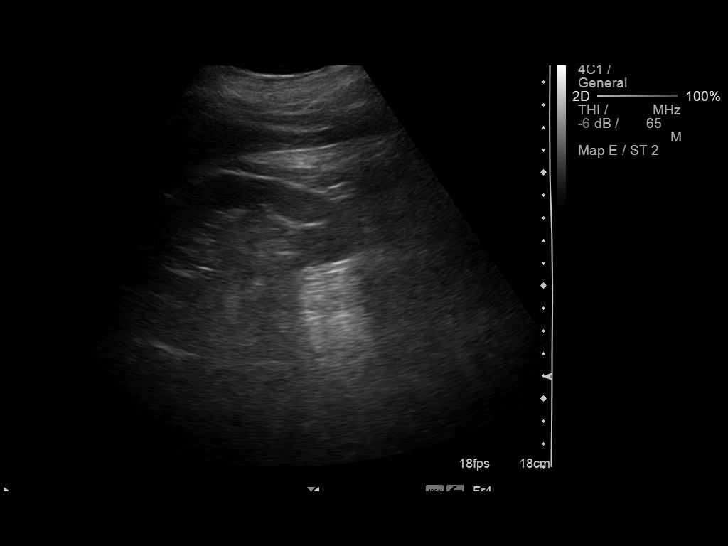
[im 4/39]
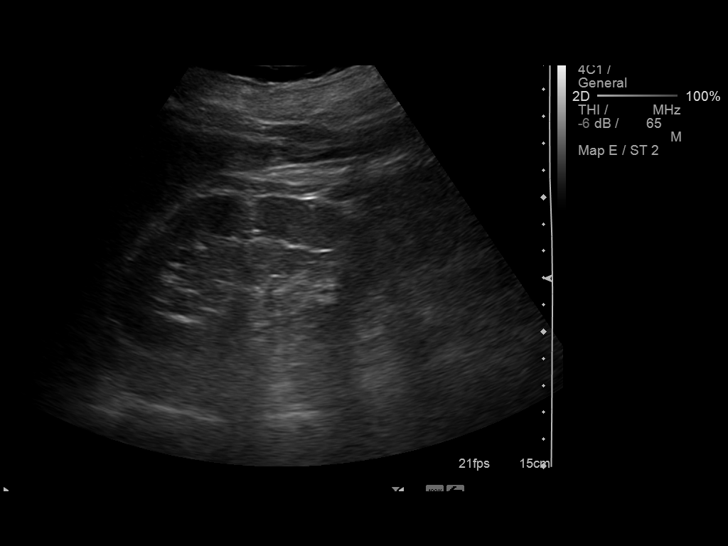
[im 7/39]
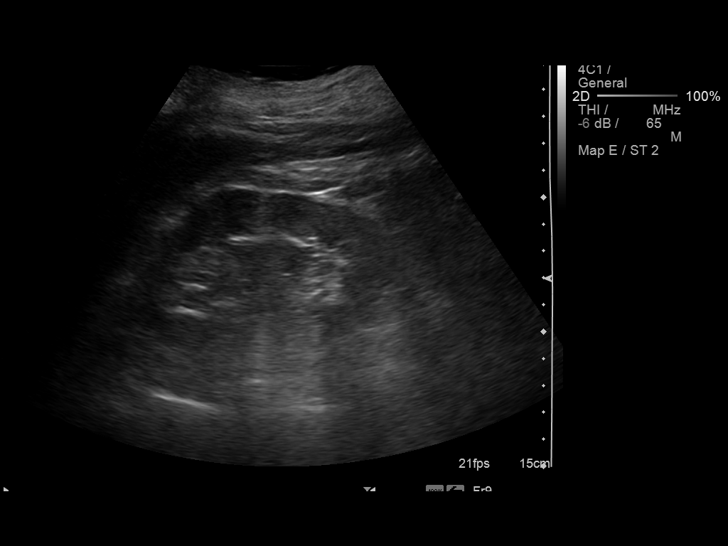
[im 10/39]
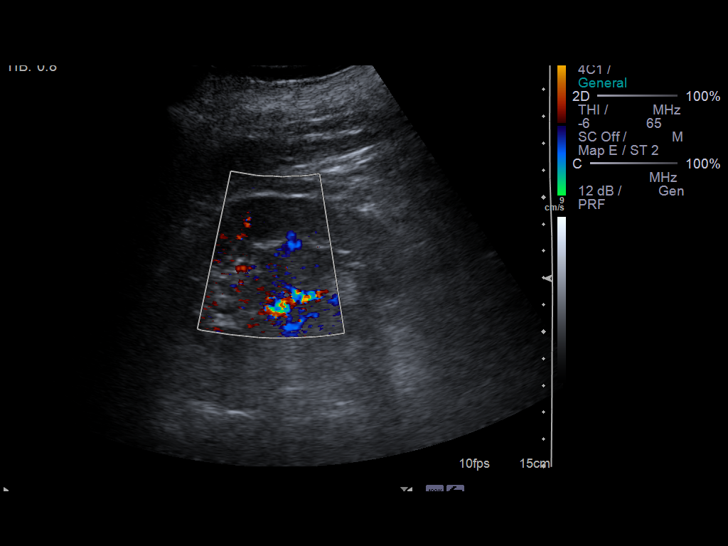
[im 13/39]
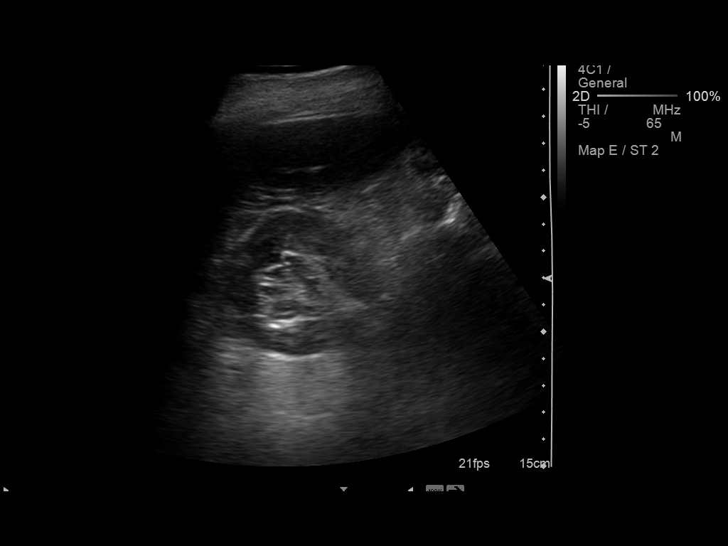
[im 15/39]
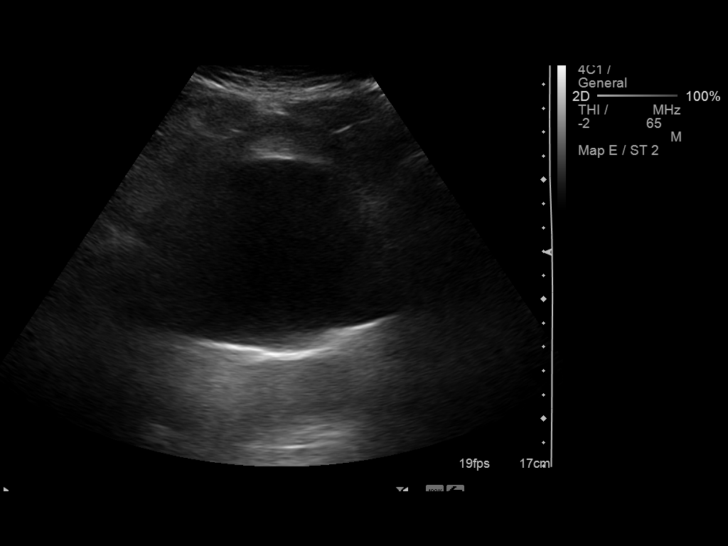
[im 18/39]
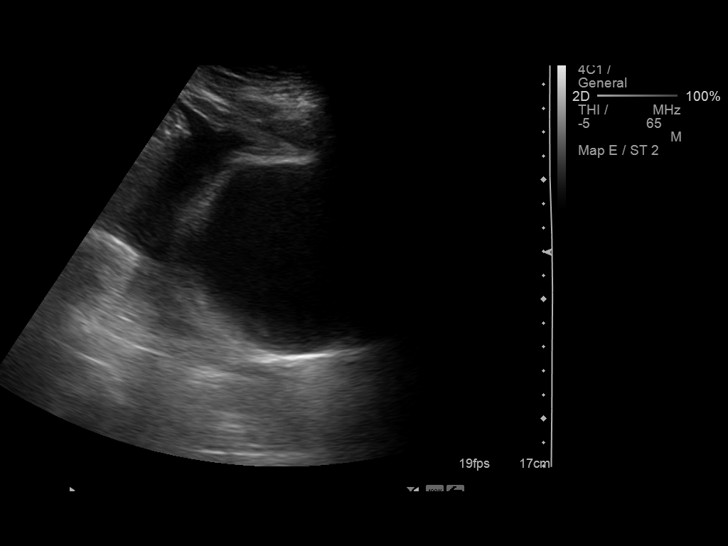
[im 21/39]
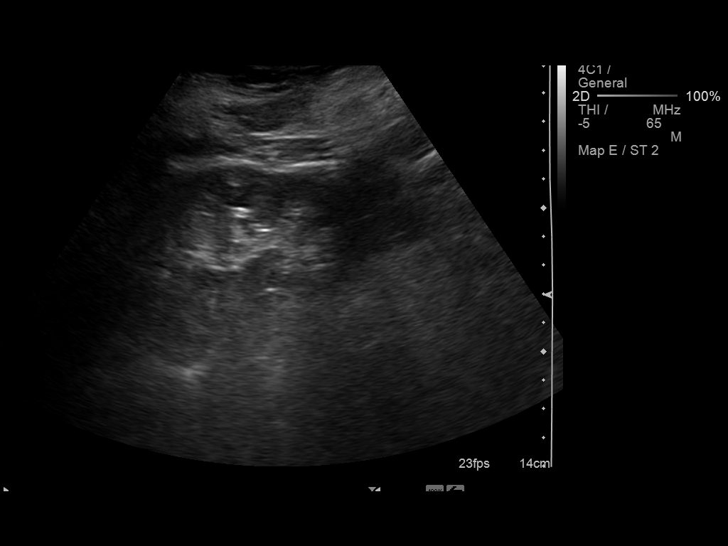
[im 24/39]
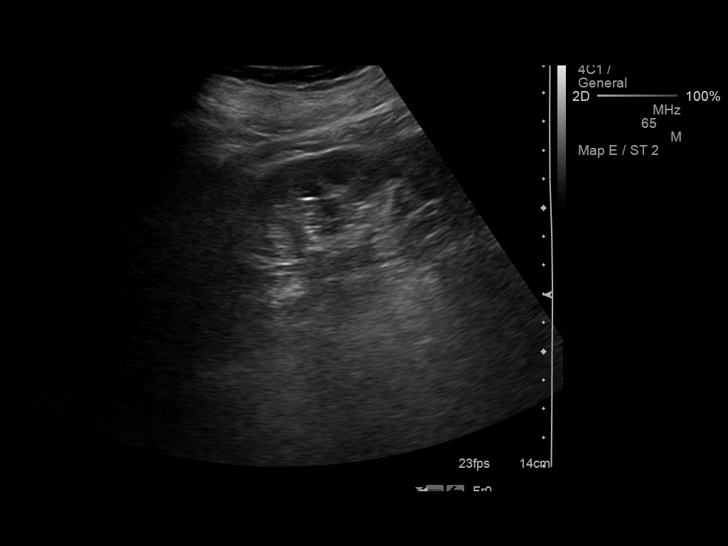
[im 26/39]
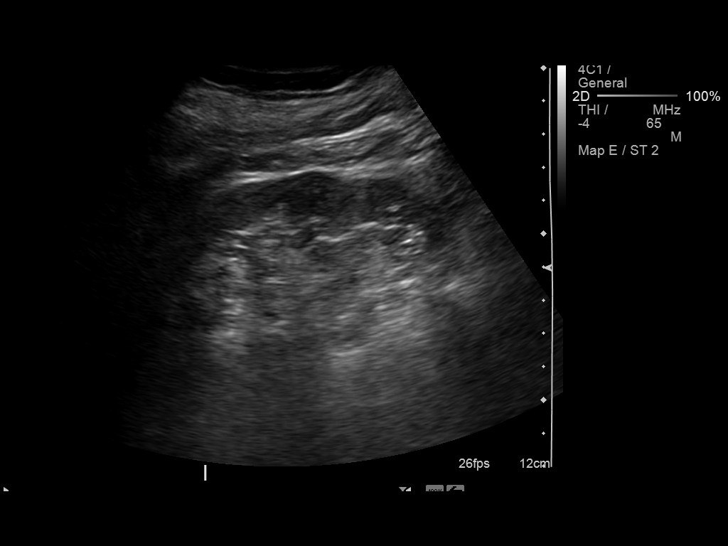
[im 29/39]
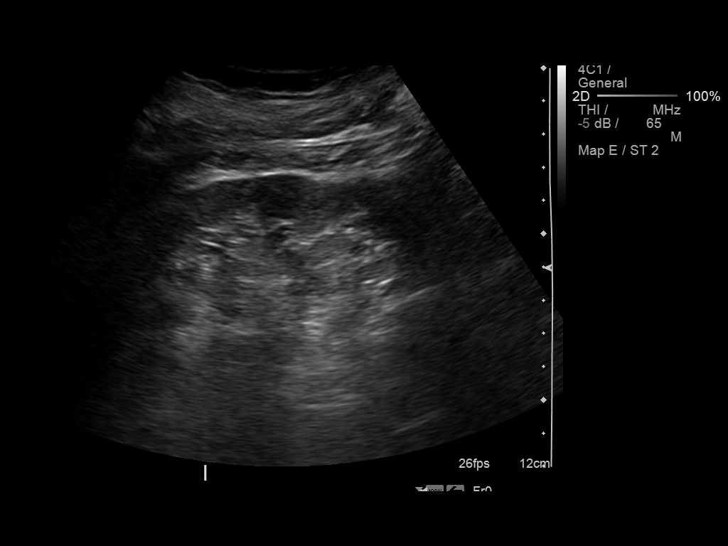
[im 32/39]
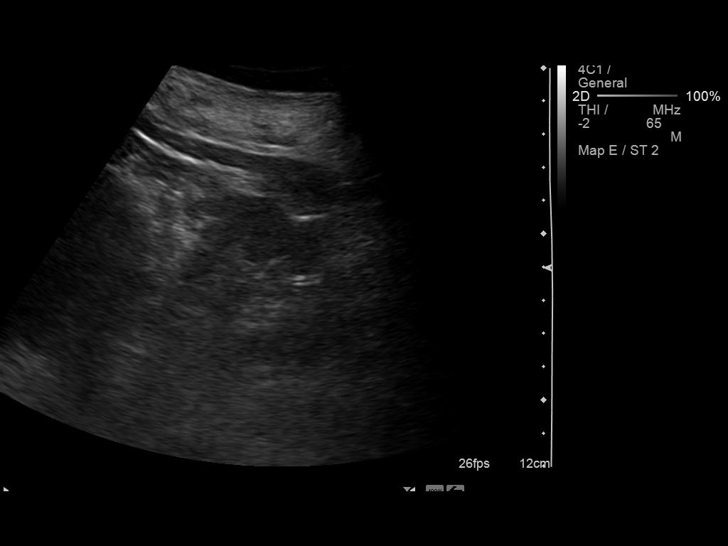
[im 35/39]
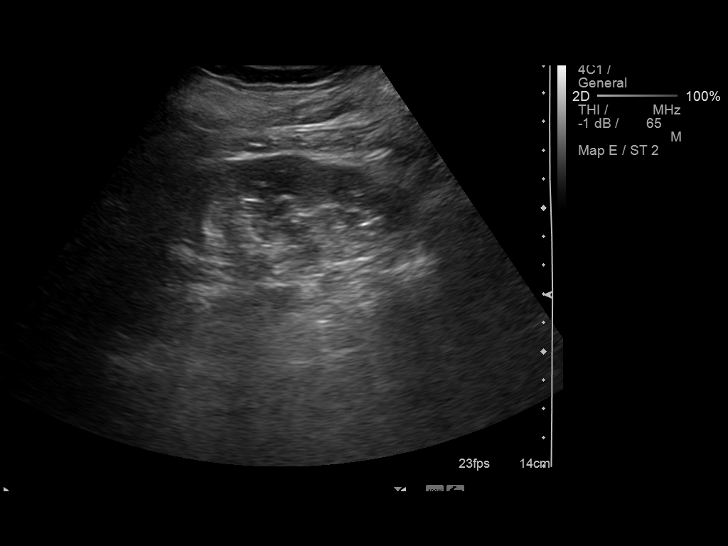
[im 39/39]
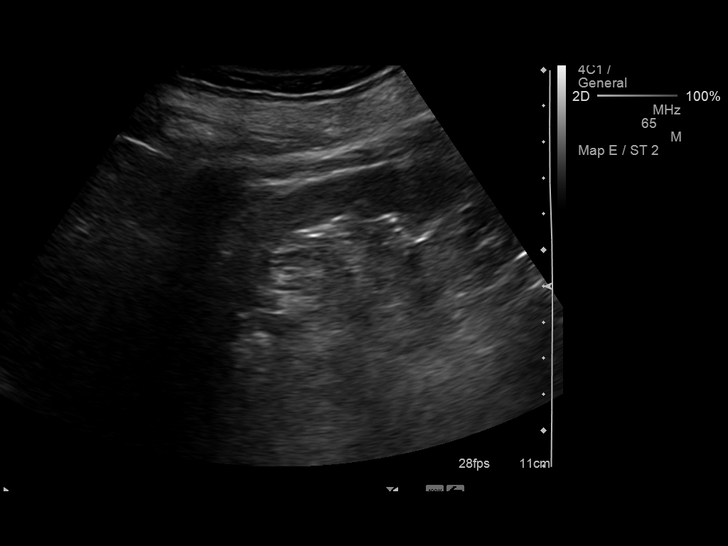

[14 of 25 positions shown; findings below may reference images not displayed]

FINDINGS: Right Kidney:  10.5 cm. No hydronephrosis or focal mass.

Left Kidney:  10.4 cm. No hydronephrosis or focal mass.

Bladder:  Normal in appearance.

Ascites is noted.
IMPRESSION: Normal exam.  No hydronephrosis or solid renal mass.

Ascites.

## 2014-04-05 MED ORDER — ACETAMINOPHEN 325 MG PO TABS
650.0000 mg | ORAL_TABLET | ORAL | Status: DC | PRN
  Administered 2014-04-05 – 2014-04-07 (×5): 650 mg via ORAL
  Filled 2014-04-05 (×5): qty 2

## 2014-04-05 MED ORDER — ACETAMINOPHEN 650 MG RE SUPP: 650.0000 mg | RECTAL | Status: DC | PRN

## 2014-04-05 NOTE — Progress Notes (Signed)
No complaints of nausea or vomiting. No abdominal bloating. Patient has tolerated NG tube being clamped well. No discomfort.

## 2014-04-05 NOTE — Progress Notes (Signed)
PT Cancellation Note  Patient Details Name: Nicole [name] MRN: [id number] DOB: Nov 29, 1930   Cancelled Treatment:    Reason Eval/Treat Not Completed: Medical issues which prohibited therapy.  Pt continues to have strict bed rest orders.  Spoke with nursing who stated she was awaiting surgical input regarding NG tube and would attempt to speak with them regarding activity order.  Will check back as able.   Farid Grigorian LUBECK 04/05/2014, 9:32 AM

## 2014-04-05 NOTE — Progress Notes (Addendum)
ANTICOAGULATION CONSULT NOTE - Follow Up Consult  Pharmacy Consult for Coumadin Indication: atrial fibrillation  Allergies  Allergen Reactions  . Iohexol      Code: VOM, Desc: PT REPORTS VOMITING W/ IVP DYE- ARS 12/26/08, Onset Date: [id number]   . Z-Pak [Azithromycin]   . Iodinated Diagnostic Agents Nausea Only    PT HAS HAD SINGULAR INCIDENT OF NAUSEA  W/ IV CONTYRAST INJECTION, NONE IF INJECTED SLOWLY//A.[place]    Patient Measurements: Height: 5\' 9"  (175.3 cm) Weight: 167 lb 1.7 oz (75.8 kg) IBW/kg (Calculated) : 66.2 Heparin Dosing Weight:   Vital Signs: Temp: 98.4 F (36.9 C) (09/14 0636) Temp src: Oral (09/14 0636) BP: 144/70 mmHg (09/14 0636) Pulse Rate: 82 (09/14 0636)  Labs:  Recent Labs  04/02/14 2324 04/03/14 0549 04/04/14 0730 04/05/14 0415  HGB  --  11.7* 11.8* 11.5*  HCT  --  34.0* 34.3* 34.1*  PLT  --  325 334 317  LABPROT 24.0*  --  21.6* 20.9*  INR 2.15*  --  1.88* 1.80*  CREATININE  --  1.36* 1.37* 1.12*    Estimated Creatinine Clearance: 41.2 ml/min (by C-G formula based on Cr of 1.12).   Medications:  Scheduled:  . atorvastatin  20 mg Oral q1800  . diltiazem  240 mg Oral Daily  . insulin aspart  0-9 Units Subcutaneous TID WC  . levothyroxine  50 mcg Oral QAC breakfast  . magnesium oxide  400 mg Oral BID  . metoprolol  25 mg Oral BID  . sodium chloride  3 mL Intravenous Q12H  . Warfarin - Pharmacist Dosing Inpatient   Does not apply q1800    Assessment: 80yo female with AFib.    INR down to 1.69  Coumadin currently on hold  Goal of Therapy:  INR 2-3 Monitor platelets by anticoagulation protocol: Yes   Plan:  1-  Coumadin on hold  2-  Daily INR  Thank you Anette Guarneri, PharmD [phone]

## 2014-04-05 NOTE — Progress Notes (Signed)
Abdomen soft, contrast in colon yesterday, having flatus, can clamp ng, should resolve now

## 2014-04-05 NOTE — Evaluation (Signed)
Physical Therapy Evaluation Patient Details Name: Nicole [name] MRN: [id number] DOB: Apr 29, 1931 Today's Date: 04/05/2014   History of Present Illness  Nicole [name] is a 80 y.o. female with PMH significnat for SOB, HTN, DM, CHF, AFIB on coumadin, AAA, colon CA, who presents with abdominal pain.  Pt found to have SBO and NG tube placed.  Clinical Impression  Pt admitted with abd found and now has NG tube placement. Pt currently with functional limitations due to the deficits listed below (see PT Problem List).  Pt will benefit from skilled PT to increase their independence and safety with mobility to allow discharge to the venue listed below. Pt should be able to return home with family assist as needed and HHPT.     Follow Up Recommendations Home health PT    Equipment Recommendations  None recommended by PT    Recommendations for Other Services       Precautions / Restrictions Precautions Precautions: Fall Restrictions Weight Bearing Restrictions: Yes      Mobility  Bed Mobility               General bed mobility comments: Pt up on BSC upon arrival.  Transfers Overall transfer level: Needs assistance   Transfers: Sit to/from Stand Sit to Stand: Min guard         General transfer comment: Min/guard for safety  Ambulation/Gait Ambulation/Gait assistance: Min guard Ambulation Distance (Feet): 140 Feet Assistive device:  (pushing IV pole) Gait Pattern/deviations: WFL(Within Functional Limits)     General Gait Details: Pt with one episode of LOB with turning too quickly which pt was able to self correct.  Pt did c/o feeling light headed towards end of gait but no dizziness.  She reports she felt better once sitting up in recliner.  Stairs            Wheelchair Mobility    Modified Rankin (Stroke Patients Only)       Balance Overall balance assessment: Needs assistance           Standing balance-Leahy Scale: Fair Standing balance  comment: 1 LOB with turning                             Pertinent Vitals/Pain Pain Assessment: No/denies pain (sore throat)    Home Living Family/patient expects to be discharged to:: Private residence Living Arrangements: Alone Available Help at Discharge: Family Type of Home: House Home Access: Stairs to enter Entrance Stairs-Rails: Left;Right;Can reach both Technical brewer of Steps: 4 Home Layout: One level Home Equipment: Grab bars - tub/shower;Shower seat;Toilet riser;Cane - single point;Walker - 2 wheels      Prior Function Level of Independence: Independent               Hand Dominance        Extremity/Trunk Assessment   Upper Extremity Assessment: Defer to OT evaluation           Lower Extremity Assessment: Overall WFL for tasks assessed      Cervical / Trunk Assessment: Normal  Communication   Communication: No difficulties  Cognition Arousal/Alertness: Awake/alert Behavior During Therapy: WFL for tasks assessed/performed Overall Cognitive Status: Within Functional Limits for tasks assessed                      General Comments      Exercises        Assessment/Plan    PT Assessment  Patient needs continued PT services  PT Diagnosis Difficulty walking   PT Problem List Decreased balance;Decreased mobility  PT Treatment Interventions Gait training;Stair training;Functional mobility training;Therapeutic activities;Therapeutic exercise   PT Goals (Current goals can be found in the Care Plan section) Acute Rehab PT Goals Patient Stated Goal: To return to PLOF PT Goal Formulation: With patient Time For Goal Achievement: 04/19/14 Potential to Achieve Goals: Good    Frequency Min 3X/week   Barriers to discharge        Co-evaluation               End of Session Equipment Utilized During Treatment: Gait belt Activity Tolerance: Patient tolerated treatment well Patient left: in chair;with call bell/phone  within reach Nurse Communication: Mobility status;Other (comment) (nurse clamped NG tube)         Time: 4818-5631 PT Time Calculation (min): 25 min   Charges:   PT Evaluation $Initial PT Evaluation Tier I: 1 Procedure PT Treatments $Gait Training: 8-22 mins   PT G Codes:          Nicole [name] 04/05/2014, 11:29 AM

## 2014-04-05 NOTE — Progress Notes (Signed)
[facility] Progress Note     Subjective: Pt doing okay.  Says when she left the hospital last time she did very well until the last few days she c/o constipation, N/V and pain.  Says today she feels much better than yesterday.  Abdomen less distended, pain improved, and some flatus.  No BM yet.  Urinating well.  Not OOB yet. NG tube in place with 1264mL/24 hr.  Objective: Vital signs in last 24 hours: Temp:  [98.4 F (36.9 C)-98.8 F (37.1 C)] 98.4 F (36.9 C) (09/14 0636) Pulse Rate:  [75-94] 82 (09/14 0636) Resp:  [17-18] 17 (09/14 0636) BP: (144-171)/(70-93) 144/70 mmHg (09/14 0636) SpO2:  [93 %-95 %] 93 % (09/14 0636) Weight:  [167 lb 1.7 oz (75.8 kg)] 167 lb 1.7 oz (75.8 kg) (09/14 0636) Last BM Date: 04/02/14  Intake/Output from previous day: 09/13 0701 - 09/14 0700 In: 720 [P.O.:120; I.V.:600] Out: 1650 [Urine:450; Emesis/NG output:1200] Intake/Output this shift:    PE: Gen:  Alert, NAD, pleasant Abd: Soft, mildly distended, but improved, mild tenderness periumbilically, +BS, no HSM, well healed scar noted   Lab Results:   Recent Labs  04/04/14 0730 04/05/14 0415  WBC 11.5* 10.0  HGB 11.8* 11.5*  HCT 34.3* 34.1*  PLT 334 317   BMET  Recent Labs  04/04/14 0730 04/05/14 0415  NA 146 146  K 3.5* 3.1*  CL 101 104  CO2 33* 29  GLUCOSE 133* 97  BUN 19 14  CREATININE 1.37* 1.12*  CALCIUM 9.3 9.0   PT/INR  Recent Labs  04/04/14 0730 04/05/14 0415  LABPROT 21.6* 20.9*  INR 1.88* 1.80*   CMP     Component Value Date/Time   NA 146 04/05/2014 0415   NA 142 12/03/2013 0833   K 3.1* 04/05/2014 0415   CL 104 04/05/2014 0415   CO2 29 04/05/2014 0415   GLUCOSE 97 04/05/2014 0415   GLUCOSE 86 12/03/2013 0833   BUN 14 04/05/2014 0415   BUN 19 12/03/2013 0833   CREATININE 1.12* 04/05/2014 0415   CALCIUM 9.0 04/05/2014 0415   PROT 6.8 04/03/2014 0549   PROT 7.0 12/03/2013 0833   ALBUMIN 3.4* 04/03/2014 0549   AST 25 04/03/2014 0549   ALT 13  04/03/2014 0549   ALKPHOS 108 04/03/2014 0549   BILITOT 0.6 04/03/2014 0549   GFRNONAA 45* 04/05/2014 0415   GFRAA 52* 04/05/2014 0415   Lipase     Component Value Date/Time   LIPASE 47 04/02/2014 2233       Studies/Results: Dg Abd 1 View  04/04/2014   CLINICAL DATA:  Bowel obstruction.  EXAM: ABDOMEN - 1 VIEW  COMPARISON:  04/03/2014.  FINDINGS: Gas, stool and residual oral contrast material are noted throughout the colon extending to the level of the distal rectum. There are a few prominent but nondilated loops of gas-filled small bowel throughout the central abdomen, measuring up to 3.7 cm in diameter, which are nonspecific. No pathologic dilatation of small bowel. No gross evidence of pneumoperitoneum. Nasogastric tube is present with tip terminating in the mid stomach. Surgical clips project over the right upper quadrant of the abdomen, compatible with prior cholecystectomy.  IMPRESSION: 1. Nonspecific, nonobstructive bowel gas pattern. 2. No pneumoperitoneum.   Electronically Signed   By: Vinnie Langton M.D.   On: 04/04/2014 11:31    Anti-infectives: Anti-infectives   None       Assessment/Plan HD #3 pSBO Multiple recurrent SBO - most recent 2 mo ago (July 2015)  N/V Abdominal pain H/o cholecystectomy, hemicolectomy (colon cancer 1999)  Plan: 1.  Continue conservative management (IVF, pain control, antiemetics, NG tube) 2.  Ambulate and IS 3.  SCD's, normally on coumadin, but being held - INR 1.80 4.  Seems to be improving, but had 1248mL/ NG tube which is dark green/black 5.  Await improvement, recheck KUB tomorrow     LOS: 3 days    DORT, Ameila Weldon 04/05/2014, 9:41 AM Pager: [phone]

## 2014-04-05 NOTE — Evaluation (Signed)
Occupational Therapy Evaluation Patient Details Name: Nicole [name] MRN: [id number] DOB: April 26, 1931 Today's Date: 04/05/2014    History of Present Illness Nicole [name] is a 80 y.o. female with PMH significnat for SOB, HTN, DM, CHF, AFIB on coumadin, AAA, colon CA, who presents with abdominal pain.  Pt found to have SBO and NG tube placed.   Clinical Impression   Pt with some "wooziness" when up with therapy today but able to tolerate toileting, grooming at the sink and functional transfers. Feel she will benefit from skilled OT services to improve ADL independence and safety for d/c home alone with intermittent supervision/check in.    Follow Up Recommendations  Home health OT    Equipment Recommendations  None recommended by OT    Recommendations for Other Services       Precautions / Restrictions Precautions Precautions: Fall Restrictions Weight Bearing Restrictions: Yes      Mobility Bed Mobility               General bed mobility comments: Pt up on BSC upon arrival.  Transfers Overall transfer level: Needs assistance   Transfers: Sit to/from Stand Sit to Stand: Min guard         General transfer comment: Min/guard for safety    Balance Overall balance assessment: Needs assistance           Standing balance-Leahy Scale: Fair Standing balance comment: 1 LOB with turning                            ADL Overall ADL's : Needs assistance/impaired Eating/Feeding:  (NPO)   Grooming: Wash/dry hands;Brushing hair;Min guard;Standing   Upper Body Bathing: Set up;Sitting   Lower Body Bathing: Min guard;Sit to/from stand   Upper Body Dressing : Set up;Sitting   Lower Body Dressing: Min guard;Sit to/from stand   Toilet Transfer: Min guard;Stand-pivot;BSC   Toileting- Water quality scientist and Hygiene: Min guard;Sit to/from stand         General ADL Comments: Pt is very motivated and lives alone. She was alittle woozy when up with  PT/OT today but able to tolerate ambulation in hallway, standing at the sink to groom and a toilet transfer. Educated on Friendly with standing slowing and monitoring for dizziness/wooziness.  She had one unsteady episode moving around in the room but able to self correct.     Vision                     Perception     Praxis      Pertinent Vitals/Pain Pain Assessment: No/denies pain     Hand Dominance     Extremity/Trunk Assessment Upper Extremity Assessment Upper Extremity Assessment: Overall WFL for tasks assessed   Lower Extremity Assessment Lower Extremity Assessment: Overall WFL for tasks assessed   Cervical / Trunk Assessment Cervical / Trunk Assessment: Normal   Communication Communication Communication: No difficulties   Cognition Arousal/Alertness: Awake/alert Behavior During Therapy: WFL for tasks assessed/performed Overall Cognitive Status: Within Functional Limits for tasks assessed                     General Comments       Exercises       Shoulder Instructions      Home Living Family/patient expects to be discharged to:: Private residence Living Arrangements: Alone Available Help at Discharge: Family Type of Home: House Home Access: Stairs to enter [employer] of  Steps: 4 Entrance Stairs-Rails: Left;Right;Can reach both Home Layout: One level     Bathroom Shower/Tub: Teacher, early years/pre: Handicapped height     Home Equipment: Grab bars - tub/shower;Shower seat;Cane - single point;Walker - 2 wheels          Prior Functioning/Environment Level of Independence: Independent             OT Diagnosis: Generalized weakness   OT Problem List: Decreased strength;Decreased knowledge of use of DME or AE   OT Treatment/Interventions: Self-care/ADL training;Patient/family education;Therapeutic activities;DME and/or AE instruction    OT Goals(Current goals can be found in the care plan section) Acute  Rehab OT Goals Patient Stated Goal: To return to PLOF OT Goal Formulation: With patient Time For Goal Achievement: 04/19/14 Potential to Achieve Goals: Good  OT Frequency: Min 2X/week   Barriers to D/C:            Co-evaluation              End of Session    Activity Tolerance: Patient tolerated treatment well Patient left: in chair;with call bell/phone within reach   Time: 7741-2878 OT Time Calculation (min): 21 min Charges:  OT General Charges $OT Visit: 1 Procedure OT Evaluation $Initial OT Evaluation Tier I: 1 Procedure OT Treatments $Self Care/Home Management : 8-22 mins G-Codes:    Jules Schick [phone] 04/05/2014, 11:55 AM

## 2014-04-05 NOTE — Progress Notes (Signed)
[facility] PROGRESS NOTE  Nicole [name] SAY:[id number] DOB: [birth year] DOA: 04/02/2014 PCP: Hollace Kinnier, DO  Assessment/Plan: 1. Small bowel obstruction -Patient with history of abdominal surgeries and recurrent small bowel obstructions, presenting with complaints of nausea vomiting abdominal pain and abdominal distention. -CT scan of abdomen and pelvis showing small bowel loops distended to 4.1 cm, with an apparent transition point at the mid ileum. Findings seem to be consistent with small bowel obstruction. -General surgery following -Continues to have output from NG tube  2. Chronic systolic congestive heart failure. -Last transthoracic echocardiogram performed 12/04/2009 which showed an ejection fraction of 45-50% -She was made n.p.o. for small bowel obstruction, Lasix currently being held. -Carefully monitoring volume status at this point -Does not apear to be volume overloaded  3. Hypertension. -Patient hypertensive with systolic blood pressures in the 150's -Added as needed labetolol for SBP's greater than 165 or HR's greater than 90  4. Type 2 diabetes mellitus.  -Hemoglobin A1c 5.5 on 01/26/2014 -Hypoglycemic agents held as she is currently n.p.o.  5. Atrial fibrillation. -Presented with INR of 2.15, Coumadin held -Continue Metoprolol and Cardizem -PRN labetolol added.   Code Status: Full Code Family Communication: family updated Disposition Plan: Continue supportive care   Consultants:  General surgery   HPI/Subjective: Patient is a pleasant 80 year old female with a past medical history of hemicolectomy, recurrent small bowel obstructions, who presented overnight to the emergency room with complaints of abdominal pain, distention, nausea and vomiting. A CT scan of abdomen and pelvis performed in the emergency department revealing findings suggestive of small bowel obstruction. General surgery was consulted. NG tube was placed. Patient currently  n.p.o., Patient thinks she is feeling a little better today. She is passing flatus, has not had BM yet.   Objective: Filed Vitals:   04/05/14 1321  BP: 158/96  Pulse: 77  Temp: 98.3 F (36.8 C)  Resp: 18    Intake/Output Summary (Last 24 hours) at 04/05/14 1421 Last data filed at 04/05/14 0647  Gross per 24 hour  Intake    720 ml  Output    800 ml  Net    -80 ml   Filed Weights   04/03/14 0516 04/04/14 0601 04/05/14 0636  Weight: 79.2 kg (174 lb 9.7 oz) 77 kg (169 lb 12.1 oz) 75.8 kg (167 lb 1.7 oz)    Exam:   General:  She is awake and alert, following commands in NAD  Cardiovascular: Irregular rate and rhythm normal S1-S2  Respiratory: Normal respiratory effort , lungs are auscultation bilaterally   Abdomen: Her abdomen is less distended, nontender  Musculoskeletal: No edema  Data Reviewed: Basic Metabolic Panel:  Recent Labs Lab 04/02/14 2233 04/03/14 0549 04/04/14 0730 04/05/14 0415  NA 141 140 146 146  K 4.0 4.1 3.5* 3.1*  CL 99 99 101 104  CO2 27 30 33* 29  GLUCOSE 159* 268* 133* 97  BUN 19 19 19 14   CREATININE 1.41* 1.36* 1.37* 1.12*  CALCIUM 9.5 9.4 9.3 9.0   Liver Function Tests:  Recent Labs Lab 04/02/14 2233 04/03/14 0549  AST 16 25  ALT 12 13  ALKPHOS 109 108  BILITOT 0.5 0.6  PROT 7.6 6.8  ALBUMIN 3.8 3.4*    Recent Labs Lab 04/02/14 2233  LIPASE 47   No results found for this basename: AMMONIA,  in the last 168 hours CBC:  Recent Labs Lab 04/02/14 2233 04/03/14 0549 04/04/14 0730 04/05/14 0415  WBC 11.6* 16.8* 11.5* 10.0  NEUTROABS 9.2*  --   --   --  HGB 12.2 11.7* 11.8* 11.5*  HCT 35.0* 34.0* 34.3* 34.1*  MCV 78.3 79.1 79.6 80.8  PLT 335 325 334 317   Cardiac Enzymes: No results found for this basename: CKTOTAL, CKMB, CKMBINDEX, TROPONINI,  in the last 168 hours BNP (last 3 results)  Recent Labs  04/03/14 0549  PROBNP 1015.0*   CBG:  Recent Labs Lab 04/04/14 1108 04/04/14 1633 04/04/14 2104  04/05/14 0640 04/05/14 1128  GLUCAP 126* 114* 108* 105* 98    No results found for this or any previous visit (from the past 240 hour(s)).   Studies: Dg Abd 1 View  04/04/2014   CLINICAL DATA:  Bowel obstruction.  EXAM: ABDOMEN - 1 VIEW  COMPARISON:  04/03/2014.  FINDINGS: Gas, stool and residual oral contrast material are noted throughout the colon extending to the level of the distal rectum. There are a few prominent but nondilated loops of gas-filled small bowel throughout the central abdomen, measuring up to 3.7 cm in diameter, which are nonspecific. No pathologic dilatation of small bowel. No gross evidence of pneumoperitoneum. Nasogastric tube is present with tip terminating in the mid stomach. Surgical clips project over the right upper quadrant of the abdomen, compatible with prior cholecystectomy.  IMPRESSION: 1. Nonspecific, nonobstructive bowel gas pattern. 2. No pneumoperitoneum.   Electronically Signed   By: Vinnie Langton M.D.   On: 04/04/2014 11:31    Scheduled Meds: . atorvastatin  20 mg Oral q1800  . diltiazem  240 mg Oral Daily  . insulin aspart  0-9 Units Subcutaneous TID WC  . levothyroxine  50 mcg Oral QAC breakfast  . magnesium oxide  400 mg Oral BID  . metoprolol  25 mg Oral BID  . sodium chloride  3 mL Intravenous Q12H  . Warfarin - Pharmacist Dosing Inpatient   Does not apply q1800   Continuous Infusions: . sodium chloride 50 mL/hr at 04/04/14 2325    Principal Problem:   SOB (shortness of breath) Active Problems:   History of colon cancer, stage III   CHF, past NICM with an EF of 30%, most recent echo 2011 showed EF to be45- 50%   Type II or unspecified type diabetes mellitus with neurological manifestations, not stated as uncontrolled   HTN (hypertension)   Hypothyroidism    Time spent: 25 min    Kelvin Cellar  [facility] Pager [phone]. If 7PM-7AM, please contact night-coverage at [email or web address], password [email or web address] 04/05/2014, 2:21 PM   LOS: 3 days

## 2014-04-05 NOTE — Progress Notes (Signed)
UR completed Jhene Westmoreland K. Coriana Angello, RN, BSN, [place], CCM  04/05/2014 11:27 AM

## 2014-04-05 NOTE — Progress Notes (Signed)
Paged and spoke with PA from surgical office. Wanted to see if a diet could be ordered for patient. PA stated that they normally like to wait at least 6 hours due to the risk of aspiration. Ice chips ordered.

## 2014-04-06 LAB — BASIC METABOLIC PANEL
Anion gap: 14 (ref 5–15)
BUN: 16 mg/dL (ref 6–23)
CHLORIDE: 106 meq/L (ref 96–112)
CO2: 28 mEq/L (ref 19–32)
Calcium: 9 mg/dL (ref 8.4–10.5)
Creatinine, Ser: 1.12 mg/dL — ABNORMAL HIGH (ref 0.50–1.10)
GFR, EST AFRICAN AMERICAN: 52 mL/min — AB (ref >90)
GFR, EST NON AFRICAN AMERICAN: 45 mL/min — AB (ref >90)
Glucose, Bld: 76 mg/dL (ref 70–99)
POTASSIUM: 3.1 meq/L — AB (ref 3.7–5.3)
SODIUM: 148 meq/L — AB (ref 137–147)

## 2014-04-06 LAB — GLUCOSE, CAPILLARY
GLUCOSE-CAPILLARY: 117 mg/dL — AB (ref 70–99)
GLUCOSE-CAPILLARY: 117 mg/dL — AB (ref 70–99)
Glucose-Capillary: 93 mg/dL (ref 70–99)
Glucose-Capillary: 113 mg/dL — ABNORMAL HIGH (ref 70–99)

## 2014-04-06 LAB — PROTIME-INR
INR: 1.69 — ABNORMAL HIGH (ref 0.00–1.49)
PROTHROMBIN TIME: 19.9 s — AB (ref 11.6–15.2)

## 2014-04-06 LAB — CBC
HCT: 32.4 % — ABNORMAL LOW (ref 36.0–46.0)
Hemoglobin: 10.9 g/dL — ABNORMAL LOW (ref 12.0–15.0)
MCH: 26.9 pg (ref 26.0–34.0)
MCHC: 33.6 g/dL (ref 30.0–36.0)
MCV: 80 fL (ref 78.0–100.0)
PLATELETS: 317 10*3/uL (ref 150–400)
RBC: 4.05 MIL/uL (ref 3.87–5.11)
RDW: 16 % — ABNORMAL HIGH (ref 11.5–15.5)
WBC: 9 10*3/uL (ref 4.0–10.5)

## 2014-04-06 IMAGING — CR DG CHEST 1V PORT
1 series · 1 of 1 positions shown · non-contrast
Comparison: Chest and two views abdomen 05/15/2012 and plain film
of the chest 12/02/2009.

CLINICAL DATA: Shortness of breath.  Status post cholecystectomy.

PORTABLE CHEST - 1 VIEW

[AP]
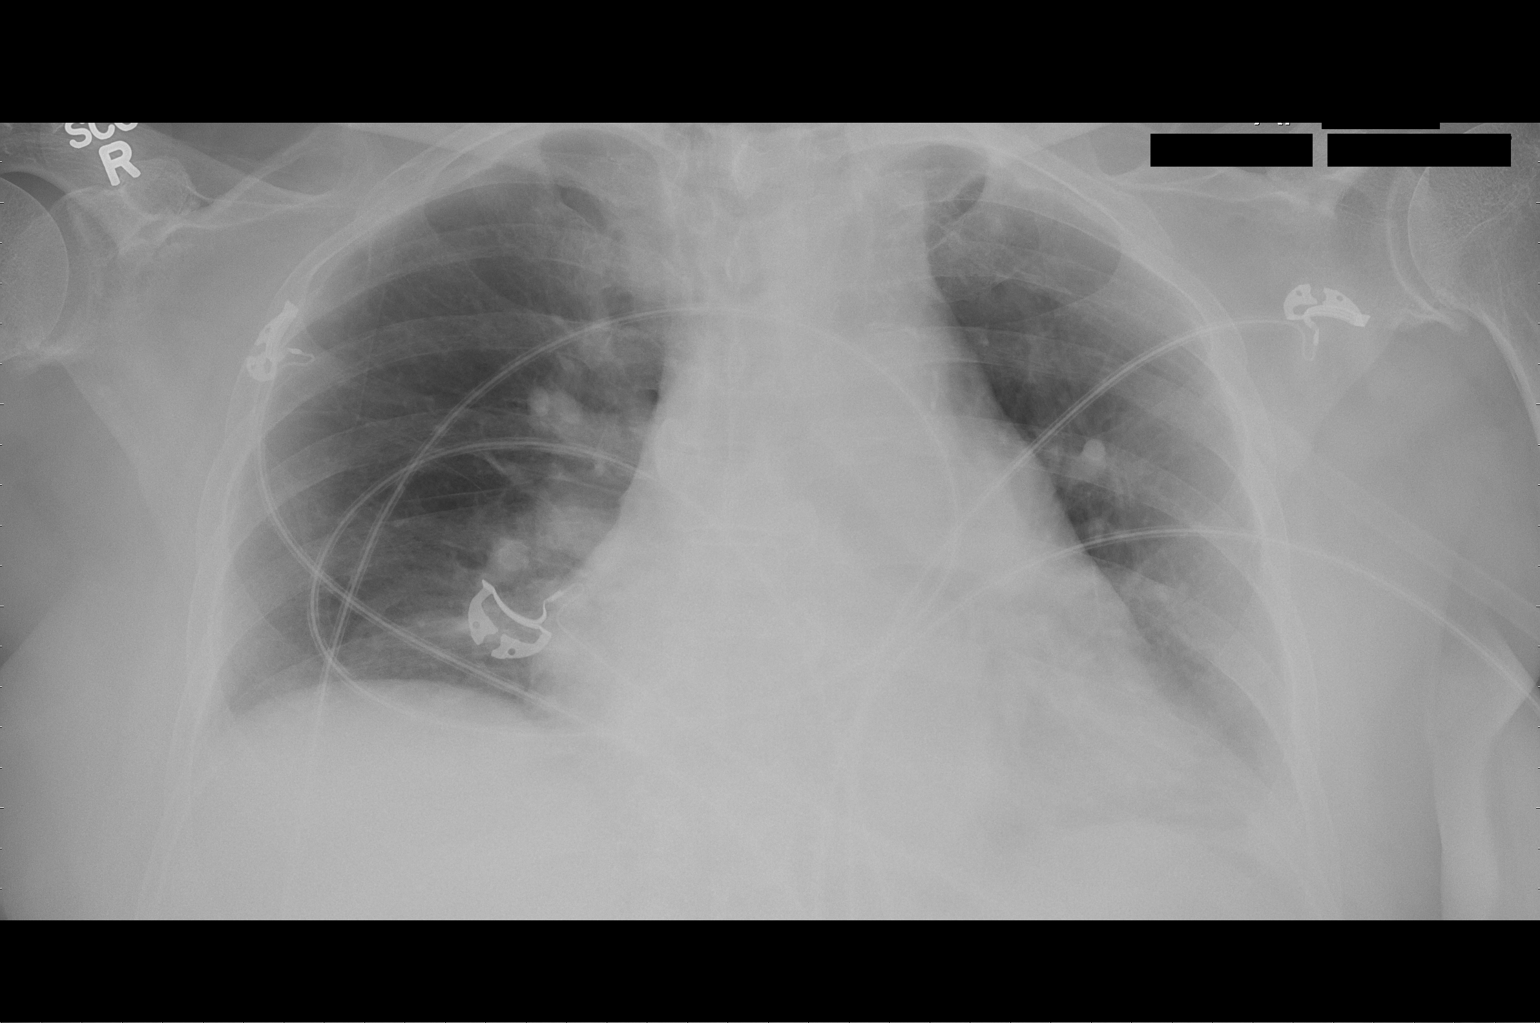

[1 of 1 positions shown; findings below may reference images not displayed]

FINDINGS: Again seen is cardiomegaly.  Lung volumes are low with
basilar atelectasis.  Calcified granuloma left midlung again
identified.  No pneumothorax.
IMPRESSION: 1.  Bibasilar atelectasis in a low-volume chest.
2.  Cardiomegaly without edema.

## 2014-04-06 IMAGING — NM NM HEPATOBILIARY IMAGE, INC GB
2 series · 12 of 12 positions shown · non-contrast
Comparison: CT 05/16/2012

CLINICAL DATA: Postop microscopic cholecystectomy.  Question bile
leak.

NUCLEAR MEDICINE HEPATOBILIARY IMAGING
TECHNIQUE: Sequential images of the abdomen were obtained [DATE] minutes following intravenous administration of
radiopharmaceutical.
Radiopharmaceutical:  A.QmOi Jc-YYm Choletec

[he hepatobiliary · 3.43mm/px · 6 of 60 frames shown (1 of 2)]
[frame 6/60]
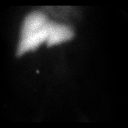
[frame 16/60]
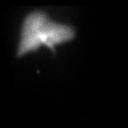
[frame 26/60]
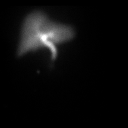
[frame 36/60]
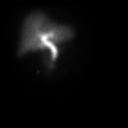
[frame 46/60]
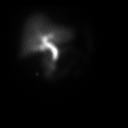
[frame 56/60]
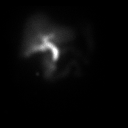

[he hepatobiliary · 3.43mm/px · 6 of 60 frames shown (2 of 2)]
[frame 6/60]
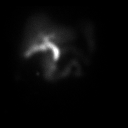
[frame 16/60]
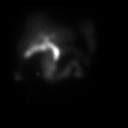
[frame 26/60]
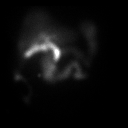
[frame 36/60]
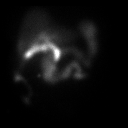
[frame 46/60]
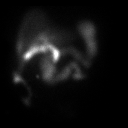
[frame 56/60]
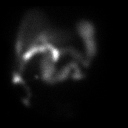

[12 of 12 positions shown; findings below may reference images not displayed]

FINDINGS: There is rapid clearance of radiotracer from the pool and
homogeneous uptake within the liver.  Initially,  bile flows into
the small bowel through the common bile duct.  During the lateral
part of the first hour of imaging and into the second hour of
imaging, radiotracer is noted along the inferior margin of the
right hepatic lobe.  Subsequently this radiotracer descends along
the right pericolic gutter and begins to outline the lateral margin
of the liver.  These findings are consistent with a bile leak.
IMPRESSION: 1..  Bile leak centered along the inferior right hepatic margin and
spilling into aathe right pericolic gutter.

2.  Bile  does flow through the common bile duct  into the small
bowel.

Findings conveyed to Dr. Bergen on 05/16/2012 at this 1309 hours

## 2014-04-06 IMAGING — CT CT ABD-PELV W/O CM
2 of 4 series · 17 of 46 positions shown, 19 images · non-contrast
Comparison: CT chest and abdomen 11/08/2006 and CT abdomen and
pelvis 02/12/2006.

CLINICAL DATA: Abdominal pain and distention.  Status post
cholecystectomy 05/12/2012.

CT ABDOMEN AND PELVIS WITHOUT CONTRAST
TECHNIQUE: Multidetector CT imaging of the abdomen and pelvis was
performed following the standard protocol without intravenous
contrast.

[Series 2: abd/pelv w/o 5.0 b31f st · axial · non-contrast · 0.82mm/px · z∈[+777,+1217]mm · 14 of 98 slices shown, 16 images]
[im 5/98  soft-tissue]
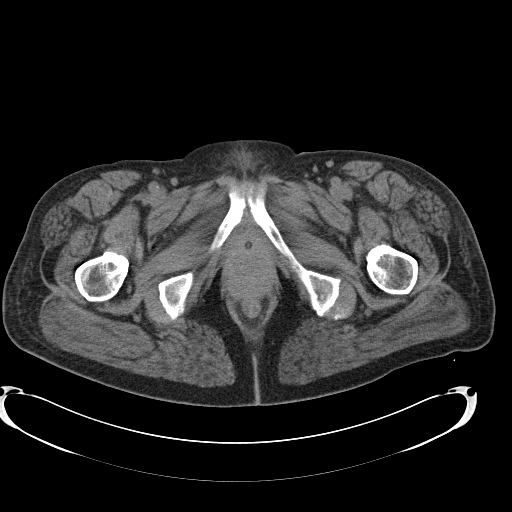
[im 5/98  bone]
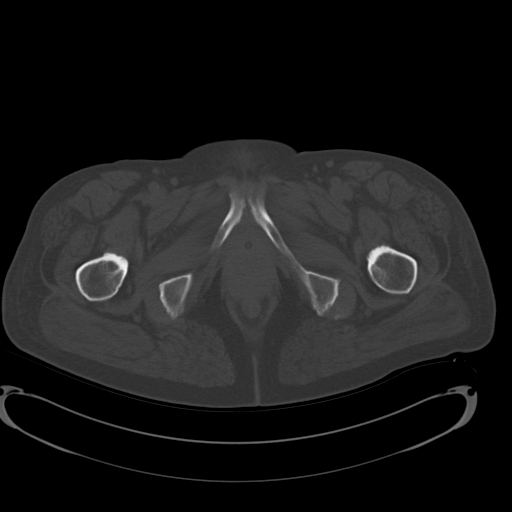
[im 13/98  soft-tissue]
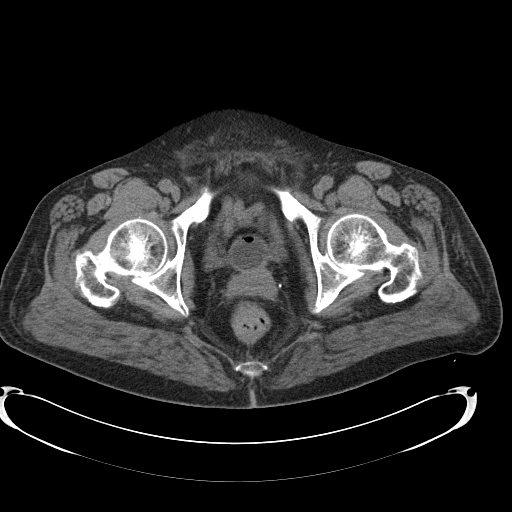
[im 21/98  soft-tissue]
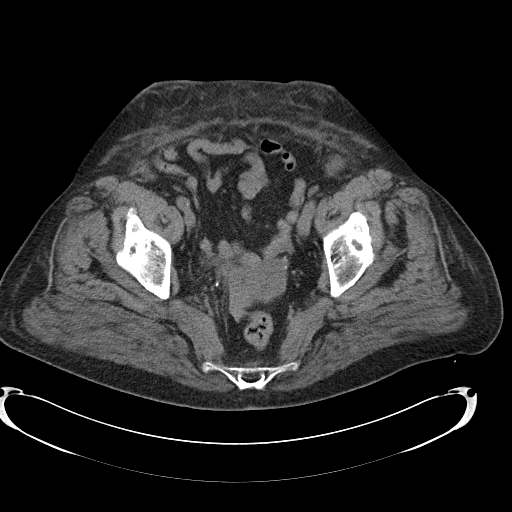
[im 25/98  soft-tissue]
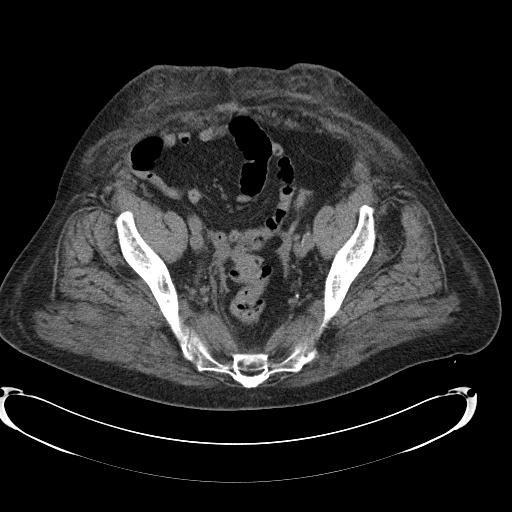
[im 33/98  soft-tissue]
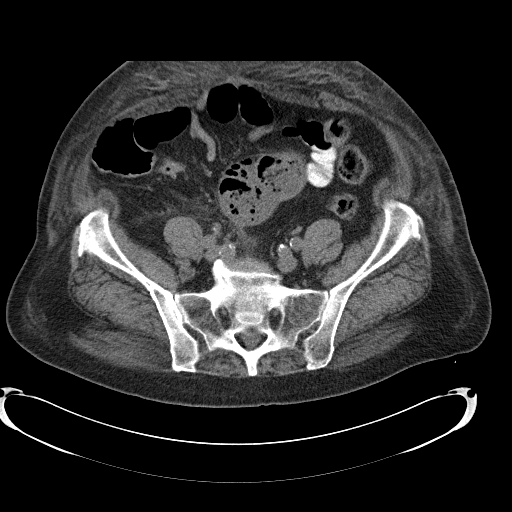
[im 41/98  soft-tissue]
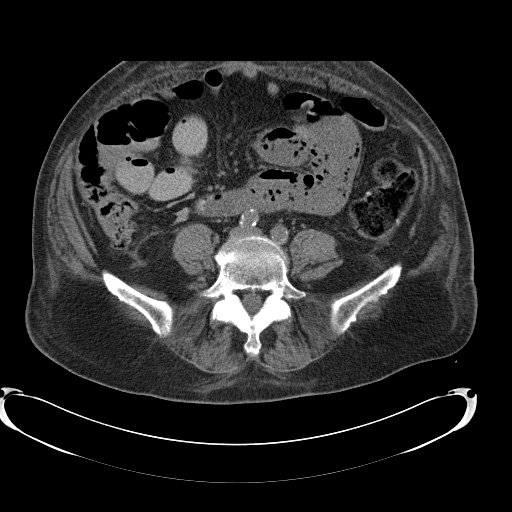
[im 45/98  soft-tissue]
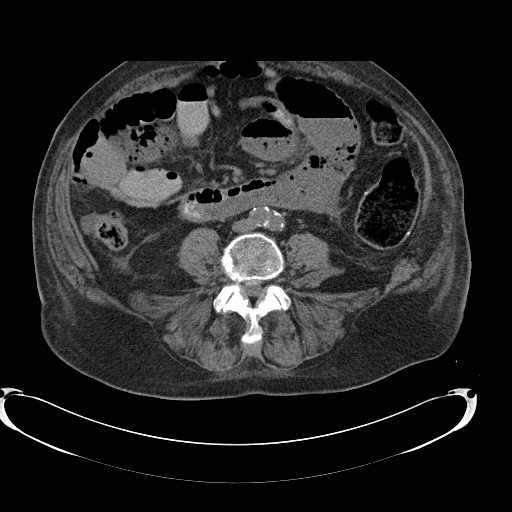
[im 53/98  soft-tissue]
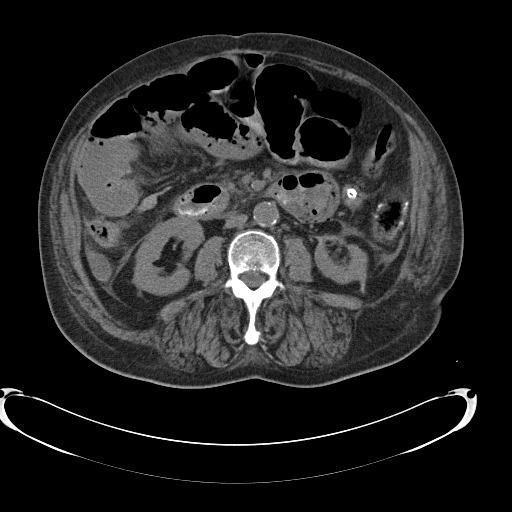
[im 57/98  soft-tissue]
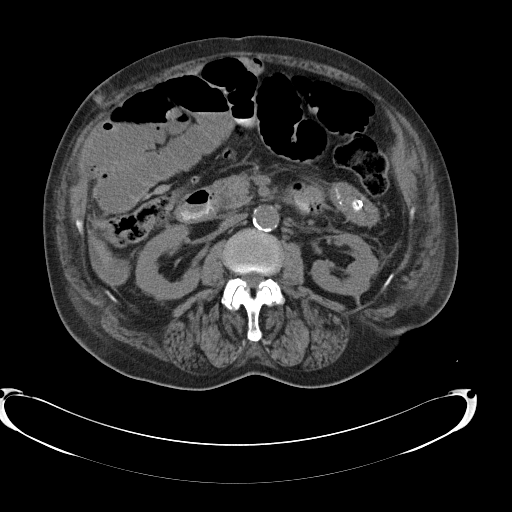
[im 57/98  bone]
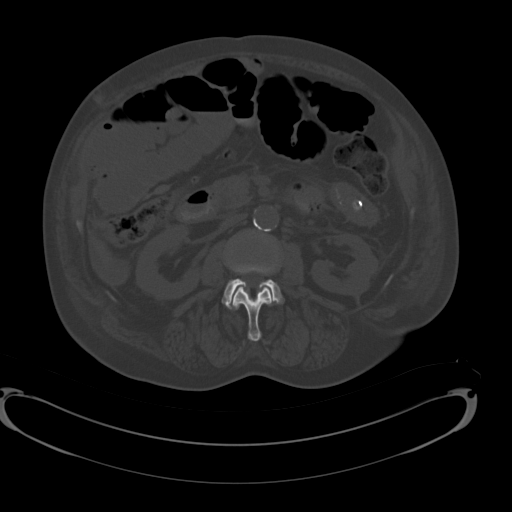
[im 65/98  soft-tissue]
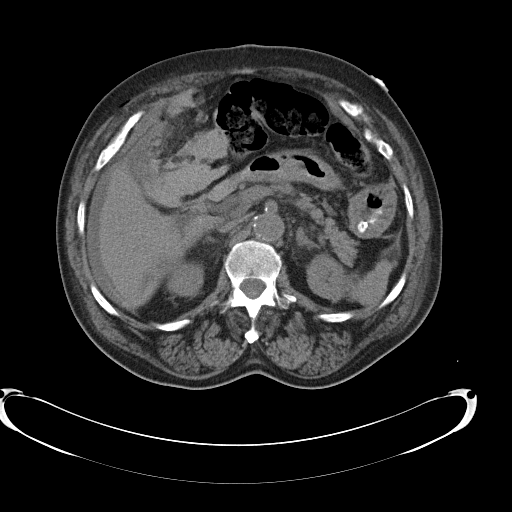
[im 73/98  soft-tissue]
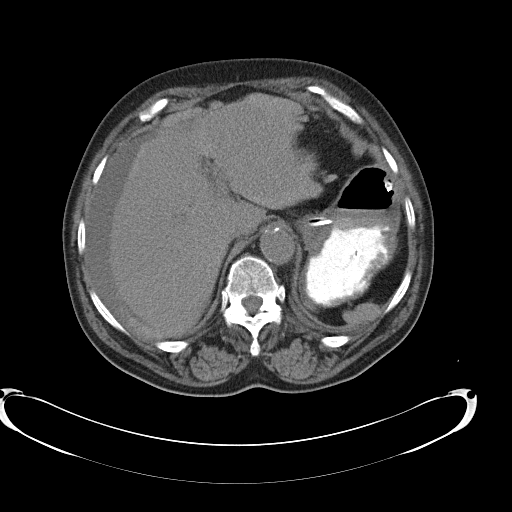
[im 77/98  soft-tissue]
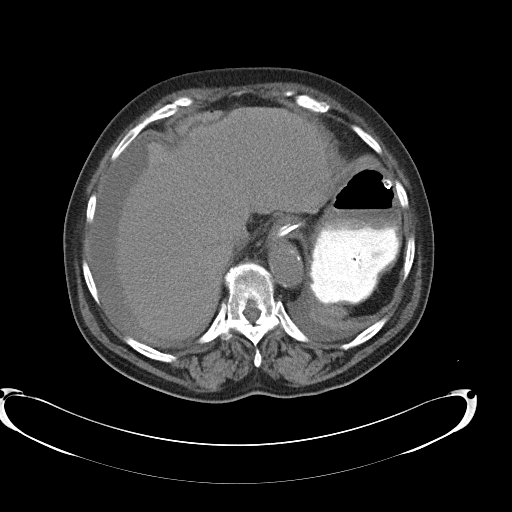
[im 85/98  soft-tissue]
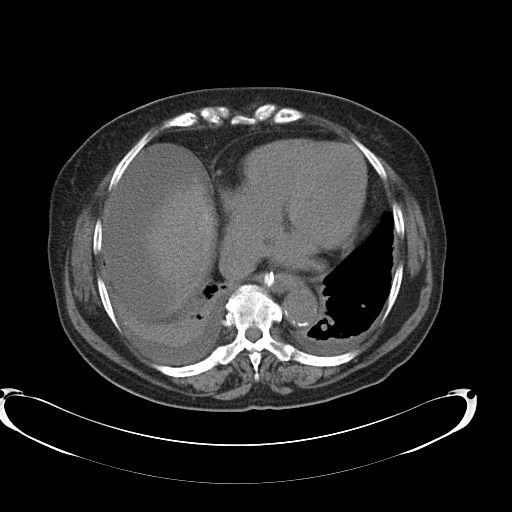
[im 93/98  soft-tissue]
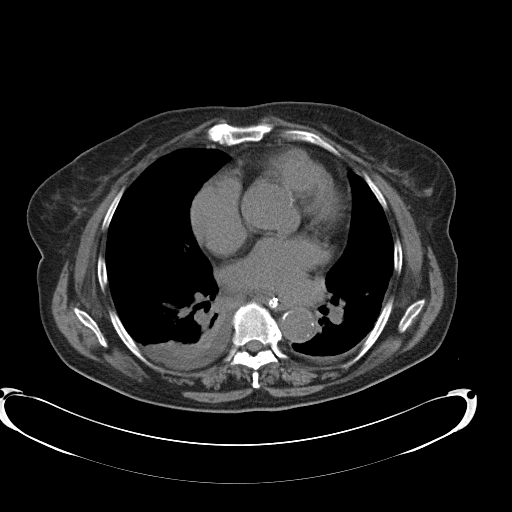

[Series 5: coronals · coronal · 0.97mm/px · 3 of 146 slices shown]
[im 49/146  soft-tissue]
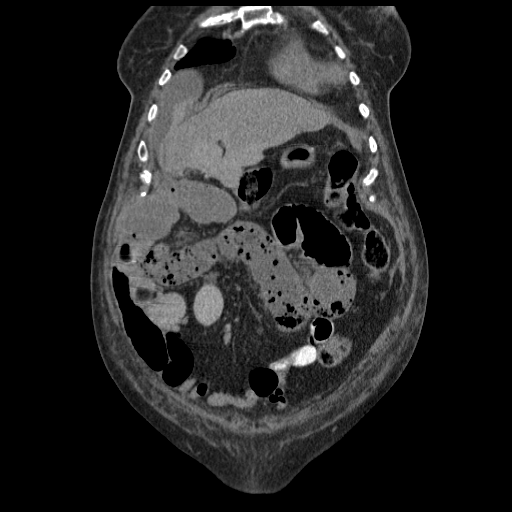
[im 65/146  soft-tissue]
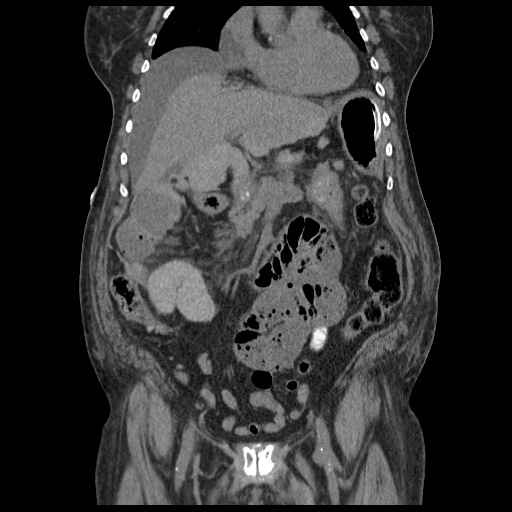
[im 81/146  soft-tissue]
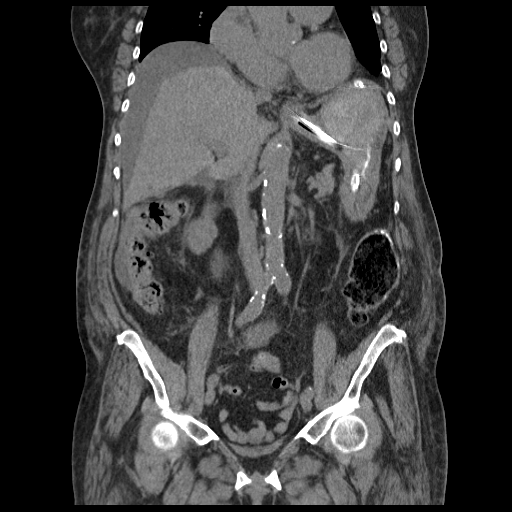

[17 of 46 positions shown; findings below may reference images not displayed]

FINDINGS: The patient has small bilateral pleural effusions, larger
on the right.  There is associated basilar atelectasis, also worse
on the right.  Heart size is enlarged.  No pericardial effusion.

NG tube is in place.  The patient has a small bowel obstruction
with dilatation of small bowel loops up to 4.4 cm and extensive
stool within small bowel.  Distal small bowel loops are completely
decompressed.  Transition point is not identified. No pneumatosis
or portal venous gas is present.  Small amount of free
intraperitoneal air is seen about the liver and likely due to
postoperative change.

There is a small amount of perihepatic and free pelvic fluid with
increased attenuation consistent with the presence of hemorrhage.
Fluid in the pelvis is Hounsfield unit measurements of 36.5.

No focal liver lesions is seen on uninfused examination.  The
patient appears to be status post splenectomy with splenosis noted
in the left upper quadrant.  The adrenal glands, kidneys and
pancreas appear normal.  Atherosclerotic vascular disease without
aneurysm of the aorta is identified.  There is no lymphadenopathy.
No focal bony abnormality.
IMPRESSION: 1.  Findings consistent with small bowel obstruction likely due to
adhesions.  No mass or evidence of bowel ischemia is present on
this uninfused study.
2.  Small to moderate volume of perihepatic and pelvic hemorrhage.
Some of the fluid about the liver could be bowel although this is
felt less likely.
3.  Status post splenectomy.
4.  Small bilateral pleural effusions.

## 2014-04-06 IMAGING — CR DG ABD PORTABLE 1V
2 series · 2 of 2 positions shown · non-contrast
Comparison: 05/15/2012

CLINICAL DATA: Abdominal pain and distension, recent surgery.

PORTABLE ABDOMEN - 1 VIEW

[AP (1 of 2)]
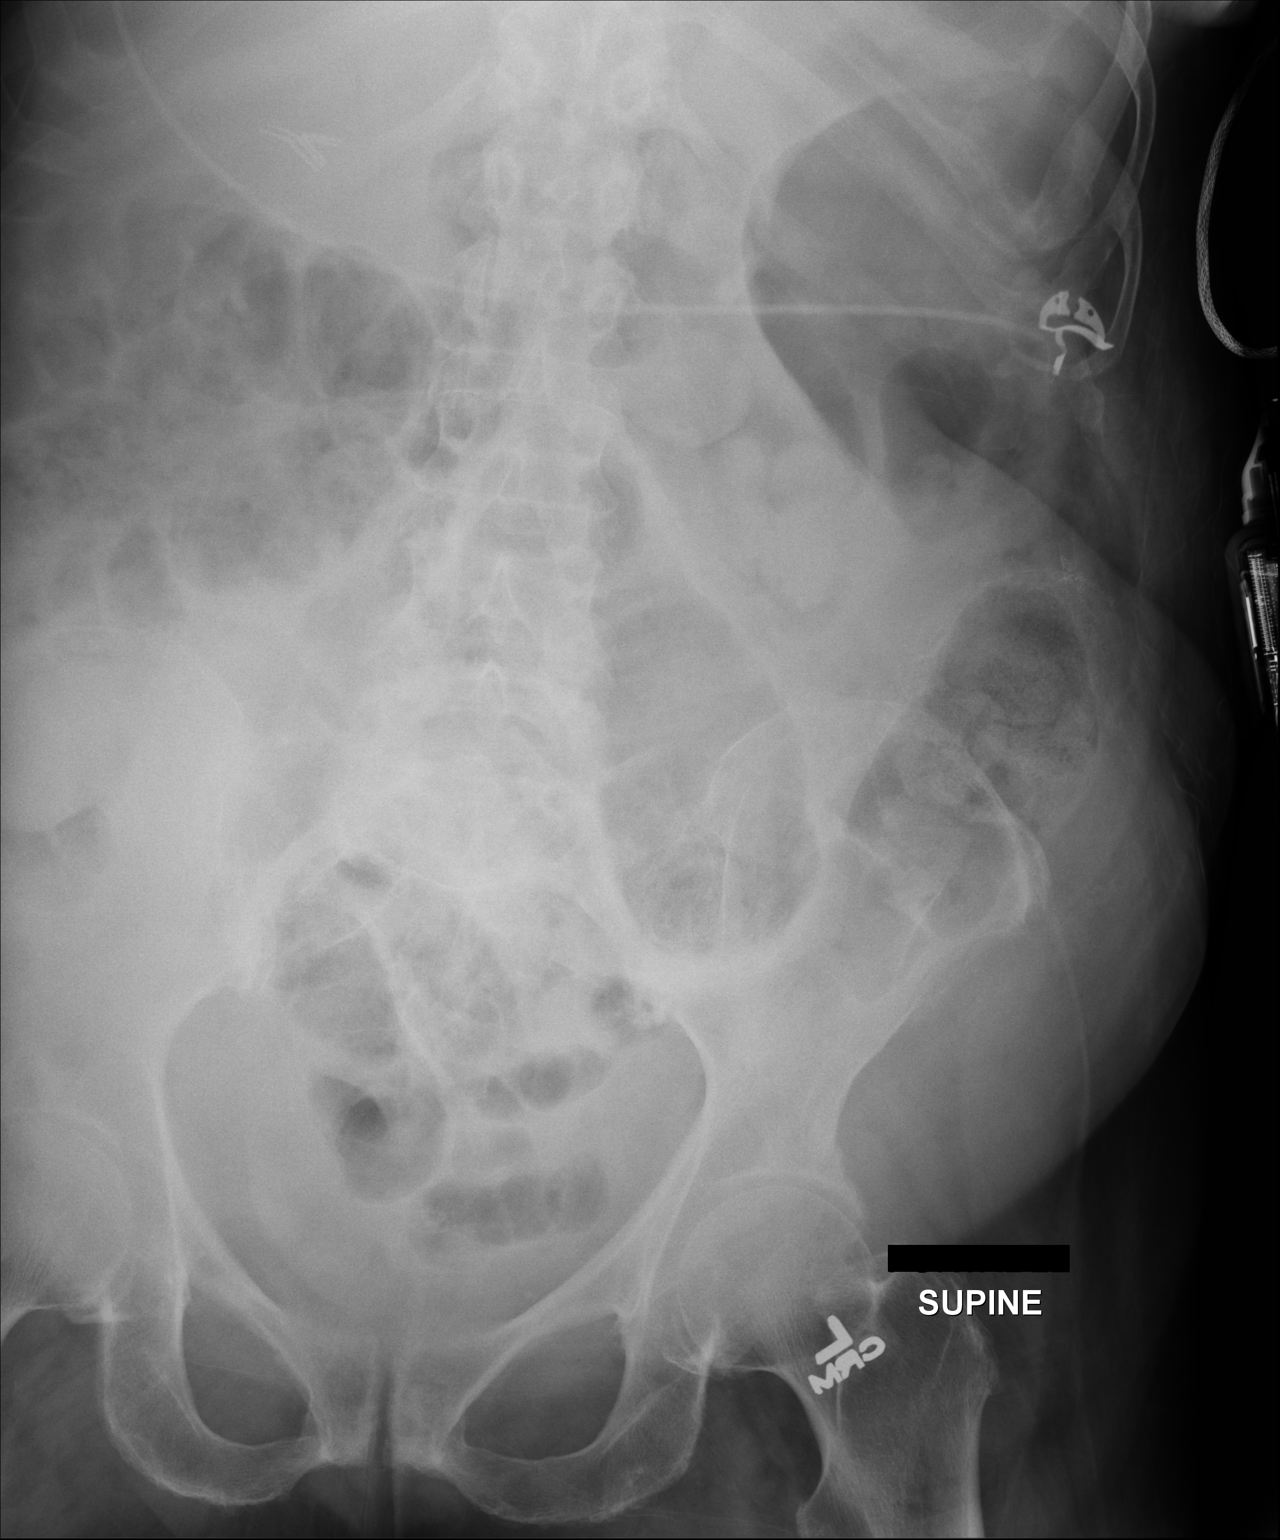

[AP (2 of 2)]
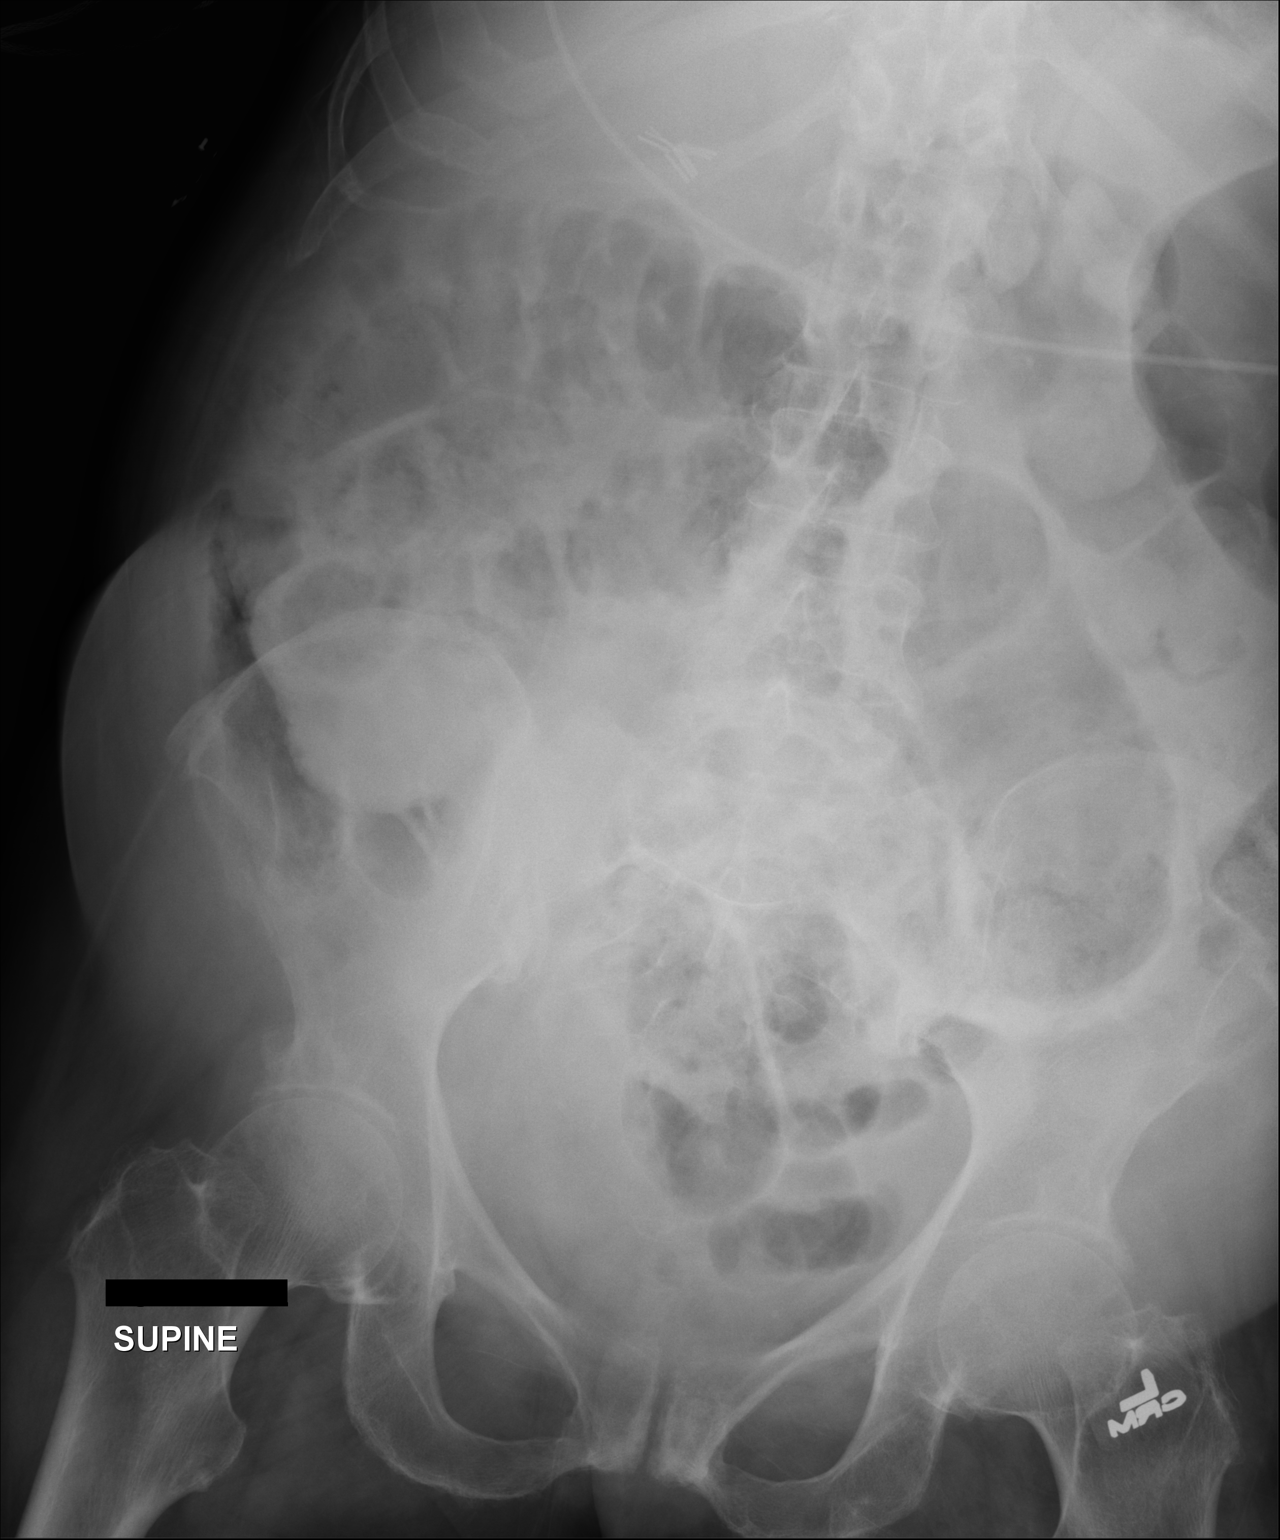

[2 of 2 positions shown; findings below may reference images not displayed]

FINDINGS: Hemidiaphragms/upper abdomen excluded from the images.
Distended loops of large and small bowel again noted.  Surgical
clips right upper quadrant.  The organ outlines normal where seen.
No acute osseous finding.
IMPRESSION: Gaseous distension of loops of large and small bowel, similar to
prior.

## 2014-04-06 MED ORDER — HYDRALAZINE HCL 20 MG/ML IJ SOLN
10.0000 mg | INTRAMUSCULAR | Status: DC | PRN
  Administered 2014-04-06 – 2014-04-07 (×2): 10 mg via INTRAVENOUS
  Filled 2014-04-06 (×2): qty 1

## 2014-04-06 MED ORDER — POTASSIUM CHLORIDE 10 MEQ/100ML IV SOLN
10.0000 meq | INTRAVENOUS | Status: AC
  Administered 2014-04-06 (×2): 10 meq via INTRAVENOUS
  Filled 2014-04-06 (×2): qty 100

## 2014-04-06 MED ORDER — FUROSEMIDE 40 MG PO TABS
40.0000 mg | ORAL_TABLET | Freq: Every day | ORAL | Status: DC
  Administered 2014-04-07 – 2014-04-08 (×2): 40 mg via ORAL
  Filled 2014-04-06 (×2): qty 1

## 2014-04-06 MED ORDER — BISACODYL 10 MG RE SUPP
10.0000 mg | Freq: Every day | RECTAL | Status: DC
  Administered 2014-04-06 – 2014-04-07 (×2): 10 mg via RECTAL
  Filled 2014-04-06 (×2): qty 1

## 2014-04-06 NOTE — Progress Notes (Signed)
Advance to clears, abd soft, has bowel function, hopefully advance tomorrow and home

## 2014-04-06 NOTE — Progress Notes (Signed)
Patient feeling well. NG tube was pulled this morning by nursing student without difficulty. Patient has orders to drink clear liquids as tolerated q 1 hour no more than 131ml at each hour. Patient had half of a gingerale, and tolerated it well. She was later ordered a full liquid diet, and she tolerated that well. No nausea or vomiting noted. Ambulated in the hallway without difficulty. Dulcolax suppository given, no results thus far.

## 2014-04-06 NOTE — Progress Notes (Signed)
Occupational Therapy Treatment Patient Details Name: Nicole [name] MRN: [id number] DOB: January 25, 1931 Today's Date: 04/06/2014    History of present illness Nicole [name] is a 80 y.o. female with PMH significnat for SOB, HTN, DM, CHF, AFIB on coumadin, AAA, colon CA, who presents with abdominal pain.  Pt found to have SBO and NG tube placed.   OT comments  Pt is making excellent progress with   She demonstrates increased activity tolerance and is able to perform BADLs with supervision   Follow Up Recommendations  No OT follow up;Supervision - Intermittent    Equipment Recommendations  None recommended by OT    Recommendations for Other Services      Precautions / Restrictions Precautions Precautions: Fall       Mobility Bed Mobility                  Transfers Overall transfer level: Independent Equipment used: None Transfers: Sit to/from [employer] to Stand: Independent              Balance Overall balance assessment: No apparent balance deficits (not formally assessed)             Standing balance comment: Pt simulated showering in standing without UE support and without LOB                   ADL       Grooming: Wash/dry hands;Wash/dry face;Oral care;Brushing hair;Supervision/safety;Standing   Upper Body Bathing: Supervision/ safety;Standing   Lower Body Bathing: Supervison/ safety;Sit to/from stand       Lower Body Dressing: Supervision/safety;Sit to/from stand   Toilet Transfer: Supervision/safety;Ambulation;Comfort height toilet   Toileting- Clothing Manipulation and Hygiene: Supervision/safety;Sit to/from stand   Tub/ Shower Transfer: Tub transfer;Supervision/safety;Ambulation     General ADL Comments: Pt able to perform simulated shower in standing with supervision       Vision                     Perception     Praxis      Cognition   Behavior During Therapy: [facility] for tasks  assessed/performed Overall Cognitive Status: Within Functional Limits for tasks assessed                       Extremity/Trunk Assessment               Exercises     Shoulder Instructions       General Comments      Pertinent Vitals/ Pain       Pain Assessment: No/denies pain  Home Living                                          Prior Functioning/Environment              Frequency Min 2X/week     Progress Toward Goals  OT Goals(current goals can now be found in the care plan section)  Progress towards OT goals: Progressing toward goals  Acute Rehab OT Goals Potential to Achieve Goals: Good ADL Goals Pt Will Perform Grooming: with supervision;standing Pt Will Transfer to Toilet: with supervision;ambulating;regular height toilet Pt Will Perform Toileting - Clothing Manipulation and hygiene: with supervision;sit to/from stand Pt Will Perform Tub/Shower Transfer: Tub transfer;with supervision;shower seat Additional ADL Goal #1: Pt will gather all items for B/D with  supervision.  Plan Discharge plan needs to be updated    Co-evaluation                 End of Session     Activity Tolerance Patient tolerated treatment well   Patient Left in chair;with call bell/phone within reach;with family/visitor present   Nurse Communication Mobility status        Time: 7591-6384 OT Time Calculation (min): 27 min  Charges: OT General Charges $OT Visit: 1 Procedure OT Treatments $Self Care/Home Management : 8-22 mins $Therapeutic Activity: 8-22 mins  Malcome Ambrocio M 04/06/2014, 6:35 PM

## 2014-04-06 NOTE — Plan of Care (Signed)
Problem: Phase I Progression Outcomes Goal: Vital signs/hemodynamically stable Outcome: Not Progressing Patient blood pressure continues to be high systolic (229N) even after administration of PRN labetalol doses.  It was unchanged this morning.

## 2014-04-06 NOTE — Progress Notes (Signed)
[facility] PROGRESS NOTE  GLENIS MUSOLF OFB:[id number] DOB: 07/24/1930 DOA: 04/02/2014 PCP: Hollace Kinnier, DO  Interim Summary  Patient is a pleasant 80 year old female with a past medical history of hemicolectomy, recurrent small bowel obstructions, who presented to the emergency room on 04/03/2049 with complaints of abdominal pain, distention, nausea and vomiting. A CT scan of abdomen and pelvis performed in the emergency department revealing findings suggestive of small bowel obstruction. General surgery was consulted. She was made n.p.o., NG tube placement and started on IV fluids. Patient showed progressive improvement as her G-tube was discontinued on 04/06/2014 with the advancement of her diet.                                         Assessment/Plan: 1. Small bowel obstruction -Patient with history of abdominal surgeries and recurrent small bowel obstructions, presenting with complaints of nausea vomiting abdominal pain and abdominal distention. -CT scan of abdomen and pelvis showing small bowel loops distended to 4.1 cm, with an apparent transition point at the mid ileum. Findings seem to be consistent with small bowel obstruction. -General surgery following -Clinically improving, NG tube was discontinued today with diet advanced to clears  2. Chronic systolic congestive heart failure. -Last transthoracic echocardiogram performed 12/04/2009 which showed an ejection fraction of 45-50% -She was made n.p.o. for small bowel obstruction, Lasix currently being held as she was n.p.o. -Currently does not appear to be volume overloaded -Diet now advanced, plan to restart Lasix in a.m. at 40 mg daily  3. Hypertension. -Patient hypertensive with systolic blood pressures in the 150's -Continue diltiazem to 40 mg by mouth daily, metoprolol 25 mg twice a day -Added as needed labetolol for SBP's greater than 165 or HR's greater than 90  4. Type 2 diabetes mellitus.  -Hemoglobin A1c 5.5  on 01/26/2014 -Hypoglycemic agents held as she had been made n.p.o. -Sliding-scale coverage  5. Atrial fibrillation. -Continue Metoprolol and Cardizem - Currently rate controlled  Code Status: Full Code Family Communication: family updated Disposition Plan: Continue supportive care   Consultants:  General surgery   HPI/Subjective:  Patient reports feeling better, ambulating down the hallway, tolerating clear  Objective: Filed Vitals:   04/06/14 1417  BP: 150/81  Pulse: 76  Temp: 97.8 F (36.6 C)  Resp: 18    Intake/Output Summary (Last 24 hours) at 04/06/14 1656 Last data filed at 04/06/14 1329  Gross per 24 hour  Intake    720 ml  Output    300 ml  Net    420 ml   Filed Weights   04/04/14 0601 04/05/14 0636 04/06/14 7824  Weight: 77 kg (169 lb 12.1 oz) 75.8 kg (167 lb 1.7 oz) 76.2 kg (167 lb 15.9 oz)    Exam:   General:  She is awake and alert, following commands in NAD  Cardiovascular: Irregular rate and rhythm normal S1-S2  Respiratory: Normal respiratory effort , lungs are auscultation bilaterally   Abdomen: Her abdomen is less distended, nontender  Musculoskeletal: No edema  Data Reviewed: Basic Metabolic Panel:  Recent Labs Lab 04/02/14 2233 04/03/14 0549 04/04/14 0730 04/05/14 0415 04/06/14 0354  NA 141 140 146 146 148*  K 4.0 4.1 3.5* 3.1* 3.1*  CL 99 99 101 104 106  CO2 27 30 33* 29 28  GLUCOSE 159* 268* 133* 97 76  BUN 19 19 19 14 16   CREATININE 1.41* 1.36* 1.37*  1.12* 1.12*  CALCIUM 9.5 9.4 9.3 9.0 9.0   Liver Function Tests:  Recent Labs Lab 04/02/14 2233 04/03/14 0549  AST 16 25  ALT 12 13  ALKPHOS 109 108  BILITOT 0.5 0.6  PROT 7.6 6.8  ALBUMIN 3.8 3.4*    Recent Labs Lab 04/02/14 2233  LIPASE 47   No results found for this basename: AMMONIA,  in the last 168 hours CBC:  Recent Labs Lab 04/02/14 2233 04/03/14 0549 04/04/14 0730 04/05/14 0415 04/06/14 0354  WBC 11.6* 16.8* 11.5* 10.0 9.0  NEUTROABS  9.2*  --   --   --   --   HGB 12.2 11.7* 11.8* 11.5* 10.9*  HCT 35.0* 34.0* 34.3* 34.1* 32.4*  MCV 78.3 79.1 79.6 80.8 80.0  PLT 335 325 334 317 317   Cardiac Enzymes: No results found for this basename: CKTOTAL, CKMB, CKMBINDEX, TROPONINI,  in the last 168 hours BNP (last 3 results)  Recent Labs  04/03/14 0549  PROBNP 1015.0*   CBG:  Recent Labs Lab 04/05/14 1623 04/05/14 2041 04/06/14 0640 04/06/14 1122 04/06/14 1611  GLUCAP 87 79 93 113* 117*    No results found for this or any previous visit (from the past 240 hour(s)).   Studies: No results found.  Scheduled Meds: . atorvastatin  20 mg Oral q1800  . bisacodyl  10 mg Rectal Daily  . diltiazem  240 mg Oral Daily  . insulin aspart  0-9 Units Subcutaneous TID WC  . levothyroxine  50 mcg Oral QAC breakfast  . magnesium oxide  400 mg Oral BID  . metoprolol  25 mg Oral BID  . sodium chloride  3 mL Intravenous Q12H  . Warfarin - Pharmacist Dosing Inpatient   Does not apply q1800   Continuous Infusions: . sodium chloride 50 mL/hr at 04/06/14 1551    Principal Problem:   SOB (shortness of breath) Active Problems:   History of colon cancer, stage III   CHF, past NICM with an EF of 30%, most recent echo 2011 showed EF to be45- 50%   Type II or unspecified type diabetes mellitus with neurological manifestations, not stated as uncontrolled   HTN (hypertension)   Hypothyroidism    Time spent: 25 min    Kelvin Cellar  [facility] Pager [phone]. If 7PM-7AM, please contact night-coverage at [email or web address], password [facility] 04/06/2014, 4:56 PM  LOS: 4 days

## 2014-04-06 NOTE — Progress Notes (Signed)
Patient rested well throughout the night. Patient did complain of dry/sore throat from not eating.  She has gotten 2 PRN doses of labetalol for systolic over 532.  Will continue to monitor CBGs as they have been dropping due to not eating.

## 2014-04-06 NOTE — Progress Notes (Signed)
Physical Therapy Treatment Patient Details Name: Nicole [name] MRN: [id number] DOB: 10-01-1930 Today's Date: 04/06/2014    History of Present Illness Nicole [name] is a 80 y.o. female with PMH significnat for SOB, HTN, DM, CHF, AFIB on coumadin, AAA, colon CA, who presents with abdominal pain.  Pt found to have SBO and NG tube placed.    PT Comments    **Pt is progressing well with mobility. She walked 230' pushing IV pole today, without loss of balance. Encouraged pt to walk later today with nursing. *  Follow Up Recommendations  Home health PT     Equipment Recommendations  None recommended by PT    Recommendations for Other Services       Precautions / Restrictions Precautions Precautions: Fall Restrictions Weight Bearing Restrictions: No    Mobility  Bed Mobility Overal bed mobility: Modified Independent             General bed mobility comments: HOB up 35*  Transfers Overall transfer level: Independent Equipment used: None Transfers: Sit to/from Stand Sit to Stand: Independent            Ambulation/Gait Ambulation/Gait assistance: Modified independent (Device/Increase time) Ambulation Distance (Feet): 230 Feet Assistive device:  (pushing IV pole) Gait Pattern/deviations: WFL(Within Functional Limits)     General Gait Details: Steady with holding IV pole, no LOB; pt attempted walking without IV pole for 20' and was steady but stated she felt more secure holding pole.   Stairs            Wheelchair Mobility    Modified Rankin (Stroke Patients Only)       Balance                                    Cognition Arousal/Alertness: Awake/alert Behavior During Therapy: WFL for tasks assessed/performed Overall Cognitive Status: Within Functional Limits for tasks assessed                      Exercises      General Comments        Pertinent Vitals/Pain Pain Assessment: No/denies pain    Home Living                      Prior Function            PT Goals (current goals can now be found in the care plan section) Acute Rehab PT Goals Patient Stated Goal: To return to PLOF; pt is a hairdresser PT Goal Formulation: With patient Time For Goal Achievement: 04/19/14 Potential to Achieve Goals: Good Progress towards PT goals: Progressing toward goals    Frequency  Min 3X/week    PT Plan Current plan remains appropriate    Co-evaluation             End of Session Equipment Utilized During Treatment: Gait belt Activity Tolerance: Patient tolerated treatment well Patient left: in chair;with call bell/phone within reach;with nursing/sitter in room     Time: 6269-4854 PT Time Calculation (min): 19 min  Charges:  $Gait Training: 8-22 mins                    G Codes:      Philomena Doheny 04/06/2014, 10:08 AM [phone]

## 2014-04-06 NOTE — Progress Notes (Signed)
[facility] Progress Note     Subjective: Her pain is significantly improved.  No N/V.  NG has been clamped for 24 hours with minimal output 40mL.  She's ambulated OOB.  Having flatus.  No BM yet.  Objective: Vital signs in last 24 hours: Temp:  [98.2 F (36.8 C)-98.3 F (36.8 C)] 98.2 F (36.8 C) (09/15 9562) Pulse Rate:  [63-98] 69 (09/15 0653) Resp:  [17-18] 17 (09/15 0608) BP: (157-175)/(83-99) 170/96 mmHg (09/15 0653) SpO2:  [95 %-98 %] 98 % (09/15 1308) Weight:  [167 lb 15.9 oz (76.2 kg)] 167 lb 15.9 oz (76.2 kg) (09/15 6578) Last BM Date: 04/02/14  Intake/Output from previous day: 09/14 0701 - 09/15 0700 In: -  Out: 105 [Urine:100; Emesis/NG output:5] Intake/Output this shift:    PE: Gen: Alert, NAD, pleasant  Abd: Soft, mildly distended, but improved, minimal tenderness periumbilically, great BS, no HSM, well healed scar noted   Lab Results:   Recent Labs  04/05/14 0415 04/06/14 0354  WBC 10.0 9.0  HGB 11.5* 10.9*  HCT 34.1* 32.4*  PLT 317 317   BMET  Recent Labs  04/05/14 0415 04/06/14 0354  NA 146 148*  K 3.1* 3.1*  CL 104 106  CO2 29 28  GLUCOSE 97 76  BUN 14 16  CREATININE 1.12* 1.12*  CALCIUM 9.0 9.0   PT/INR  Recent Labs  04/05/14 0415 04/06/14 0354  LABPROT 20.9* 19.9*  INR 1.80* 1.69*   CMP     Component Value Date/Time   NA 148* 04/06/2014 0354   NA 142 12/03/2013 0833   K 3.1* 04/06/2014 0354   CL 106 04/06/2014 0354   CO2 28 04/06/2014 0354   GLUCOSE 76 04/06/2014 0354   GLUCOSE 86 12/03/2013 0833   BUN 16 04/06/2014 0354   BUN 19 12/03/2013 0833   CREATININE 1.12* 04/06/2014 0354   CALCIUM 9.0 04/06/2014 0354   PROT 6.8 04/03/2014 0549   PROT 7.0 12/03/2013 0833   ALBUMIN 3.4* 04/03/2014 0549   AST 25 04/03/2014 0549   ALT 13 04/03/2014 0549   ALKPHOS 108 04/03/2014 0549   BILITOT 0.6 04/03/2014 0549   GFRNONAA 45* 04/06/2014 0354   GFRAA 52* 04/06/2014 0354   Lipase     Component Value Date/Time   LIPASE 47  04/02/2014 2233       Studies/Results: Dg Abd 1 View  04/04/2014   CLINICAL DATA:  Bowel obstruction.  EXAM: ABDOMEN - 1 VIEW  COMPARISON:  04/03/2014.  FINDINGS: Gas, stool and residual oral contrast material are noted throughout the colon extending to the level of the distal rectum. There are a few prominent but nondilated loops of gas-filled small bowel throughout the central abdomen, measuring up to 3.7 cm in diameter, which are nonspecific. No pathologic dilatation of small bowel. No gross evidence of pneumoperitoneum. Nasogastric tube is present with tip terminating in the mid stomach. Surgical clips project over the right upper quadrant of the abdomen, compatible with prior cholecystectomy.  IMPRESSION: 1. Nonspecific, nonobstructive bowel gas pattern. 2. No pneumoperitoneum.   Electronically Signed   By: Vinnie Langton M.D.   On: 04/04/2014 11:31    Anti-infectives: Anti-infectives   None       Assessment/Plan HD #4 pSBO  Multiple recurrent SBO - most recent 2 mo ago (July 2015)  N/V  Abdominal pain  H/o cholecystectomy, hemicolectomy (colon cancer 1999)   Plan:  1. Continue conservative management, d/c NG tube today and start sips/chips, may advance to clears  later today if tolerating 2. Ambulate and IS  3. SCD's, normally on coumadin, but being held - INR 1.69, if she tolerates clears, maybe tomorrow we can resume anticoagulation 4. NG output minimal 5. Hopefully we can advance diet as tolerated tomorrow and send home soon 6. Add dulcolax    LOS: 4 days    DORT, Jinny Blossom 04/06/2014, 7:49 AM Pager: [phone]

## 2014-04-07 DIAGNOSIS — K565 Intestinal adhesions [bands], unspecified as to partial versus complete obstruction: Secondary | ICD-10-CM

## 2014-04-07 LAB — BASIC METABOLIC PANEL
ANION GAP: 15 (ref 5–15)
BUN: 11 mg/dL (ref 6–23)
CALCIUM: 8.6 mg/dL (ref 8.4–10.5)
CO2: 25 mEq/L (ref 19–32)
Chloride: 106 mEq/L (ref 96–112)
Creatinine, Ser: 0.9 mg/dL (ref 0.50–1.10)
GFR, EST AFRICAN AMERICAN: 68 mL/min — AB (ref >90)
GFR, EST NON AFRICAN AMERICAN: 58 mL/min — AB (ref >90)
Glucose, Bld: 129 mg/dL — ABNORMAL HIGH (ref 70–99)
POTASSIUM: 3.3 meq/L — AB (ref 3.7–5.3)
SODIUM: 146 meq/L (ref 137–147)

## 2014-04-07 LAB — GLUCOSE, CAPILLARY
GLUCOSE-CAPILLARY: 149 mg/dL — AB (ref 70–99)
GLUCOSE-CAPILLARY: 168 mg/dL — AB (ref 70–99)
GLUCOSE-CAPILLARY: 91 mg/dL (ref 70–99)
Glucose-Capillary: 171 mg/dL — ABNORMAL HIGH (ref 70–99)
Glucose-Capillary: 122 mg/dL — ABNORMAL HIGH (ref 70–99)

## 2014-04-07 LAB — CBC
HCT: 33.1 % — ABNORMAL LOW (ref 36.0–46.0)
Hemoglobin: 11.5 g/dL — ABNORMAL LOW (ref 12.0–15.0)
MCH: 27.5 pg (ref 26.0–34.0)
MCHC: 34.7 g/dL (ref 30.0–36.0)
MCV: 79.2 fL (ref 78.0–100.0)
PLATELETS: 325 10*3/uL (ref 150–400)
RBC: 4.18 MIL/uL (ref 3.87–5.11)
RDW: 15.9 % — ABNORMAL HIGH (ref 11.5–15.5)
WBC: 7.8 10*3/uL (ref 4.0–10.5)

## 2014-04-07 LAB — PROTIME-INR
INR: 1.75 — AB (ref 0.00–1.49)
PROTHROMBIN TIME: 20.4 s — AB (ref 11.6–15.2)

## 2014-04-07 MED ORDER — OXYCODONE-ACETAMINOPHEN 5-325 MG PO TABS
1.0000 | ORAL_TABLET | Freq: Once | ORAL | Status: AC
  Administered 2014-04-07: 1 via ORAL
  Filled 2014-04-07: qty 1

## 2014-04-07 MED ORDER — WARFARIN SODIUM 2.5 MG PO TABS
2.5000 mg | ORAL_TABLET | ORAL | Status: DC
Start: 2014-04-08 — End: 2014-04-08
  Filled 2014-04-07: qty 1

## 2014-04-07 MED ORDER — POTASSIUM CHLORIDE CRYS ER 20 MEQ PO TBCR
40.0000 meq | EXTENDED_RELEASE_TABLET | Freq: Two times a day (BID) | ORAL | Status: DC
  Administered 2014-04-07 – 2014-04-08 (×3): 40 meq via ORAL
  Filled 2014-04-07 (×4): qty 2

## 2014-04-07 MED ORDER — WARFARIN SODIUM 5 MG PO TABS
5.0000 mg | ORAL_TABLET | ORAL | Status: DC
  Administered 2014-04-07: 5 mg via ORAL
  Filled 2014-04-07: qty 1

## 2014-04-07 MED ORDER — METOPROLOL TARTRATE 50 MG PO TABS
50.0000 mg | ORAL_TABLET | Freq: Two times a day (BID) | ORAL | Status: DC
  Administered 2014-04-07 – 2014-04-08 (×3): 50 mg via ORAL
  Filled 2014-04-07 (×4): qty 1

## 2014-04-07 NOTE — Progress Notes (Signed)
[facility] PROGRESS NOTE  Nicole [name] DDU:[id number] DOB: September 26, 1930 DOA: 04/02/2014 PCP: Hollace Kinnier, DO  Assessment/Plan: 1. Small bowel obstruction -Patient with history of abdominal surgeries and recurrent small bowel obstructions, presenting with complaints of nausea vomiting abdominal pain and abdominal distention.  -CT scan of abdomen and pelvis showing small bowel loops distended to 4.1 cm, with an apparent transition point at the mid ileum. Findings seem to be consistent with small bowel obstruction.  -General surgery consulted and is following -Clinically improving, NG tube was discontinued 9/15 -Pt remains on full liquids. Cont to advance diet. Hopefully home soon if pt remains stable 2. Chronic systolic congestive heart failure.  -Last transthoracic echocardiogram performed 12/04/2009 which showed an ejection fraction of 45-50%  -Lasix initially held secondary to NPO status -Diet now advanced, resumed Lasix in a.m. at 40 mg daily  3. Hypertension.  -Patient hypertensive with systolic blood pressures in the 150's  -Continued diltiazem to 40 mg by mouth daily, metoprolol 25 mg twice a day  -PRN labetolol for SBP's greater than 165 or HR's greater than 90  -As BP remains elevated, will increase metoprolol to 50mg  bid 4. Type 2 diabetes mellitus.  -Hemoglobin A1c 5.5 on 01/26/2014  -Hypoglycemic agents held as she had been made n.p.o.  -Sliding-scale coverage  -Would resume home meds on discharge 5. Atrial fibrillation.  -Continue Metoprolol and Cardizem per above -Remains rate controlled  Code Status: Full Family Communication: Pt in room (indicate person spoken with, relationship, and if by phone, the number) Disposition Plan: Pending   Consultants:  Surgery  Procedures:    Antibiotics:  none (indicate start date, and stop date if known)  HPI/Subjective: Reports feeling better, eager to go home  Objective: Filed Vitals:   04/06/14 2237  04/07/14 0544 04/07/14 0833 04/07/14 1407  BP: 124/63 168/97 162/88 143/86  Pulse:  74 70 66  Temp:  97.3 F (36.3 C) 98.4 F (36.9 C) 98.3 F (36.8 C)  TempSrc:  Oral Oral Oral  Resp:   18 18  Height:      Weight:  76.9 kg (169 lb 8.5 oz)    SpO2:  97% 98% 99%    Intake/Output Summary (Last 24 hours) at 04/07/14 1502 Last data filed at 04/07/14 1343  Gross per 24 hour  Intake   1720 ml  Output    251 ml  Net   1469 ml   Filed Weights   04/05/14 0636 04/06/14 0608 04/07/14 0544  Weight: 75.8 kg (167 lb 1.7 oz) 76.2 kg (167 lb 15.9 oz) 76.9 kg (169 lb 8.5 oz)    Exam:   General:  Awake, in nad  Cardiovascular: regular,s 1, s2  Respiratory: normal resp effort, no wheezing  Abdomen: soft,mildly distended, tympanic, pos BS  Musculoskeletal: perfused, no clubbing   Data Reviewed: Basic Metabolic Panel:  Recent Labs Lab 04/03/14 0549 04/04/14 0730 04/05/14 0415 04/06/14 0354 04/07/14 0249  NA 140 146 146 148* 146  K 4.1 3.5* 3.1* 3.1* 3.3*  CL 99 101 104 106 106  CO2 30 33* 29 28 25   GLUCOSE 268* 133* 97 76 129*  BUN 19 19 14 16 11   CREATININE 1.36* 1.37* 1.12* 1.12* 0.90  CALCIUM 9.4 9.3 9.0 9.0 8.6   Liver Function Tests:  Recent Labs Lab 04/02/14 2233 04/03/14 0549  AST 16 25  ALT 12 13  ALKPHOS 109 108  BILITOT 0.5 0.6  PROT 7.6 6.8  ALBUMIN 3.8 3.4*    Recent Labs Lab  04/02/14 2233  LIPASE 47   No results found for this basename: AMMONIA,  in the last 168 hours CBC:  Recent Labs Lab 04/02/14 2233 04/03/14 0549 04/04/14 0730 04/05/14 0415 04/06/14 0354 04/07/14 0249  WBC 11.6* 16.8* 11.5* 10.0 9.0 7.8  NEUTROABS 9.2*  --   --   --   --   --   HGB 12.2 11.7* 11.8* 11.5* 10.9* 11.5*  HCT 35.0* 34.0* 34.3* 34.1* 32.4* 33.1*  MCV 78.3 79.1 79.6 80.8 80.0 79.2  PLT 335 325 334 317 317 325   Cardiac Enzymes: No results found for this basename: CKTOTAL, CKMB, CKMBINDEX, TROPONINI,  in the last 168 hours BNP (last 3  results)  Recent Labs  04/03/14 0549  PROBNP 1015.0*   CBG:  Recent Labs Lab 04/06/14 1611 04/06/14 2132 04/07/14 0558 04/07/14 0848 04/07/14 1059  GLUCAP 117* 117* 149* 168* 171*    No results found for this or any previous visit (from the past 240 hour(s)).   Studies: No results found.  Scheduled Meds: . atorvastatin  20 mg Oral q1800  . bisacodyl  10 mg Rectal Daily  . diltiazem  240 mg Oral Daily  . furosemide  40 mg Oral Daily  . insulin aspart  0-9 Units Subcutaneous TID WC  . levothyroxine  50 mcg Oral QAC breakfast  . magnesium oxide  400 mg Oral BID  . metoprolol  50 mg Oral BID  . potassium chloride  40 mEq Oral BID  . sodium chloride  3 mL Intravenous Q12H  . [START ON 04/08/2014] warfarin  2.5 mg Oral Q T,Th,S,Su-1800  . warfarin  5 mg Oral Q M,W,F-1800  . Warfarin - Pharmacist Dosing Inpatient   Does not apply q1800   Continuous Infusions: . sodium chloride 50 mL/hr at 04/06/14 1551    Principal Problem:   SOB (shortness of breath) Active Problems:   History of colon cancer, stage III   CHF, past NICM with an EF of 30%, most recent echo 2011 showed EF to be45- 50%   Type II or unspecified type diabetes mellitus with neurological manifestations, not stated as uncontrolled   HTN (hypertension)   Hypothyroidism  Time spent: 62min  Dedrick Heffner, [facility] Pager [phone]. If 7PM-7AM, please contact night-coverage at [email or web address], password [facility] 04/07/2014, 3:02 PM  LOS: 5 days

## 2014-04-07 NOTE — Progress Notes (Signed)
ANTICOAGULATION CONSULT NOTE - Follow Up Consult  Pharmacy Consult for Coumadin Indication: atrial fibrillation  Allergies  Allergen Reactions  . Iohexol      Code: VOM, Desc: PT REPORTS VOMITING W/ IVP DYE- ARS 12/26/08, Onset Date: [id number]   . Z-Pak [Azithromycin]   . Iodinated Diagnostic Agents Nausea Only    PT HAS HAD SINGULAR INCIDENT OF NAUSEA  W/ IV CONTYRAST INJECTION, NONE IF INJECTED SLOWLY//A.CALHOUN   Labs:  Recent Labs  04/05/14 0415 04/06/14 0354 04/07/14 0249  HGB 11.5* 10.9* 11.5*  HCT 34.1* 32.4* 33.1*  PLT 317 317 325  LABPROT 20.9* 19.9* 20.4*  INR 1.80* 1.69* 1.75*  CREATININE 1.12* 1.12* 0.90    Estimated Creatinine Clearance: 51.2 ml/min (by C-G formula based on Cr of 0.9).   Medications:  Scheduled:  . atorvastatin  20 mg Oral q1800  . bisacodyl  10 mg Rectal Daily  . diltiazem  240 mg Oral Daily  . furosemide  40 mg Oral Daily  . insulin aspart  0-9 Units Subcutaneous TID WC  . levothyroxine  50 mcg Oral QAC breakfast  . magnesium oxide  400 mg Oral BID  . metoprolol  50 mg Oral BID  . potassium chloride  40 mEq Oral BID  . sodium chloride  3 mL Intravenous Q12H  . Warfarin - Pharmacist Dosing Inpatient   Does not apply q1800    Assessment: 80yo female with AFib.    INR at 1.75  Resuming home Coumadin today  Goal of Therapy:  INR 2-3 Monitor platelets by anticoagulation protocol: Yes   Plan:  1-  Resuming Coumadin today at home dose 5 mg MWF, 2.5 mg TTSS 2-  Daily INR  Thank you Anette Guarneri, PharmD [phone]

## 2014-04-07 NOTE — Progress Notes (Signed)
[facility] Progress Note     Subjective: Pt denies abdominal pain or N/V.  Had some dizziness/headache while eating breakfast this am.  Had a BM and still having flatus.  Ambulated some yesterday.    Objective: Vital signs in last 24 hours: Temp:  [97.3 F (36.3 C)-98.4 F (36.9 C)] 98.4 F (36.9 C) (09/16 0833) Pulse Rate:  [70-102] 70 (09/16 0833) Resp:  [16-18] 18 (09/16 0833) BP: (124-183)/(63-101) 162/88 mmHg (09/16 0833) SpO2:  [95 %-98 %] 98 % (09/16 0833) Weight:  [169 lb 8.5 oz (76.9 kg)] 169 lb 8.5 oz (76.9 kg) (09/16 0544) Last BM Date: 04/06/14  Intake/Output from previous day: 09/15 0701 - 09/16 0700 In: 1300 [P.O.:1300] Out: 551 [Urine:550; Stool:1] Intake/Output this shift:    PE: Gen:  Alert, NAD, pleasant Abd: Soft, NT/ND, +BS, no HSM, abdominal scars noted   Lab Results:   Recent Labs  04/06/14 0354 04/07/14 0249  WBC 9.0 7.8  HGB 10.9* 11.5*  HCT 32.4* 33.1*  PLT 317 325   BMET  Recent Labs  04/06/14 0354 04/07/14 0249  NA 148* 146  K 3.1* 3.3*  CL 106 106  CO2 28 25  GLUCOSE 76 129*  BUN 16 11  CREATININE 1.12* 0.90  CALCIUM 9.0 8.6   PT/INR  Recent Labs  04/06/14 0354 04/07/14 0249  LABPROT 19.9* 20.4*  INR 1.69* 1.75*   CMP     Component Value Date/Time   NA 146 04/07/2014 0249   NA 142 12/03/2013 0833   K 3.3* 04/07/2014 0249   CL 106 04/07/2014 0249   CO2 25 04/07/2014 0249   GLUCOSE 129* 04/07/2014 0249   GLUCOSE 86 12/03/2013 0833   BUN 11 04/07/2014 0249   BUN 19 12/03/2013 0833   CREATININE 0.90 04/07/2014 0249   CALCIUM 8.6 04/07/2014 0249   PROT 6.8 04/03/2014 0549   PROT 7.0 12/03/2013 0833   ALBUMIN 3.4* 04/03/2014 0549   AST 25 04/03/2014 0549   ALT 13 04/03/2014 0549   ALKPHOS 108 04/03/2014 0549   BILITOT 0.6 04/03/2014 0549   GFRNONAA 58* 04/07/2014 0249   GFRAA 68* 04/07/2014 0249   Lipase     Component Value Date/Time   LIPASE 47 04/02/2014 2233       Studies/Results: No results  found.  Anti-infectives: Anti-infectives   None       Assessment/Plan HD #5 pSBO  Multiple recurrent SBO - most recent 2 mo ago (July 2015)  N/V  Abdominal pain   H/o cholecystectomy, hemicolectomy (colon cancer 1999)  Plan:  1. Advance to fulls, advance as tolerated 2. Ambulate and IS  3. SCD's, Ok to resume coumadin today if tolerates fulls 4. Had BM yesterday, could d/c as early as today if tolerating diet      LOS: 5 days    DORT, Ronny Korff 04/07/2014, 8:55 AM Pager: [phone]

## 2014-04-07 NOTE — Progress Notes (Signed)
Patient alert and oriented x4.  Patient stating she has toe pain in fourth toe on left foot, rating 9/10.  PRN acetaminophen administered but did not help pain per patient.  NP on call notified.  Will await further orders.  Patient aware and agreeable.  Will continue to monitor.

## 2014-04-07 NOTE — Progress Notes (Signed)
Occupational Therapy Treatment Patient Details Name: Nicole [name] MRN: [id number] DOB: Dec 13, 1930 Today's Date: 04/07/2014    History of present illness Nicole [name] is a 80 y.o. female with PMH significnat for SOB, HTN, DM, CHF, AFIB on coumadin, AAA, colon CA, who presents with abdominal pain.  Pt found to have SBO and NG tube placed. NG tube discontinued 04/06/14   OT comments  Pt up in room with supervision/modified independent for ADL. Provided energy conservation handout and reviewed all information with pt and she verbalizes understanding of how to incorporate information into daily routine. Feel she is ok to be up with nursing while in the hospital to complete ADL. SHe will have intermittent supervision at home.   Follow Up Recommendations  No OT follow up;Supervision - Intermittent    Equipment Recommendations  None recommended by OT    Recommendations for Other Services      Precautions / Restrictions Precautions Precautions: Fall Restrictions Weight Bearing Restrictions: No       Mobility Bed Mobility Overal bed mobility: Modified Independent                Transfers Overall transfer level: Modified independent   Transfers: Sit to/from Stand           General transfer comment: from toilet used grab bar    Balance                                   ADL                                         General ADL Comments: Pt practiced reaching into closet for higher items in closet and down into closet to lower shelves also without LOB and supervision. She states she wanted to practice with toilet in bathroom so practiced and pt is modified independent with on and off toilet and she states her toilet may be higher than this one here and she has a vanity beside toilet. Pt states just getting up to the bathroom and reaching into closet with OT is fatiguing so issued pt an energy conservation handout and reviewed information  with her. She found this information to be helpful. DIscussed ideas including taking rest breaks, breaking up tasks into smaller tasks, using a reacher to pick up items around the house, using lukewarm water instead of hot water to shower, initially using shower seat for energy conservation to complete other tasks easier, sitting on stool in kitchen to prepare meals and other ideas. Pt states she will have frequent visitors and people checking in on her. Feel she is ok to be up with nursing while in hospital but do not feel she needs further acute OT. Pt agreeable.       Vision                     Perception     Praxis      Cognition   Behavior During Therapy: [facility] for tasks assessed/performed Overall Cognitive Status: Within Functional Limits for tasks assessed                       Extremity/Trunk Assessment               Exercises     Shoulder Instructions  General Comments      Pertinent Vitals/ Pain       Pain Assessment: No/denies pain  Home Living                                          Prior Functioning/Environment              Frequency       Progress Toward Goals  OT Goals(current goals can now be found in the care plan section)  Progress towards OT goals: Goals met/education completed, patient discharged from [place] All goals met and education completed, patient discharged from OT services    Co-evaluation                 End of Session     Activity Tolerance Patient tolerated treatment well (does report some fatigue at end of session but able to perform all tasks)   Patient Left in chair;with call bell/phone within reach   Nurse Communication          Time: 1000-1033 OT Time Calculation (min): 33 min  Charges: OT General Charges $OT Visit: 1 Procedure OT Treatments $Self Care/Home Management : 8-22 mins $Therapeutic Activity: 8-22 mins  Jules Schick [phone] 04/07/2014, 11:26 AM

## 2014-04-07 NOTE — Progress Notes (Signed)
Agree with above 

## 2014-04-08 ENCOUNTER — Other Ambulatory Visit: Payer: Medicare Other

## 2014-04-08 LAB — BASIC METABOLIC PANEL
ANION GAP: 17 — AB (ref 5–15)
BUN: 9 mg/dL (ref 6–23)
CALCIUM: 9.2 mg/dL (ref 8.4–10.5)
CO2: 23 meq/L (ref 19–32)
Chloride: 103 mEq/L (ref 96–112)
Creatinine, Ser: 0.98 mg/dL (ref 0.50–1.10)
GFR calc Af Amer: 61 mL/min — ABNORMAL LOW (ref >90)
GFR, EST NON AFRICAN AMERICAN: 53 mL/min — AB (ref >90)
Glucose, Bld: 104 mg/dL — ABNORMAL HIGH (ref 70–99)
Potassium: 3.9 mEq/L (ref 3.7–5.3)
SODIUM: 143 meq/L (ref 137–147)

## 2014-04-08 LAB — GLUCOSE, CAPILLARY
GLUCOSE-CAPILLARY: 149 mg/dL — AB (ref 70–99)
Glucose-Capillary: 101 mg/dL — ABNORMAL HIGH (ref 70–99)

## 2014-04-08 LAB — PROTIME-INR
INR: 1.67 — ABNORMAL HIGH (ref 0.00–1.49)
Prothrombin Time: 19.7 seconds — ABNORMAL HIGH (ref 11.6–15.2)

## 2014-04-08 IMAGING — CR DG CHEST 1V PORT
1 series · 1 of 1 positions shown · non-contrast
Comparison: 05/16/2012

CLINICAL DATA: Central line placement

PORTABLE CHEST - 1 VIEW

[AP]
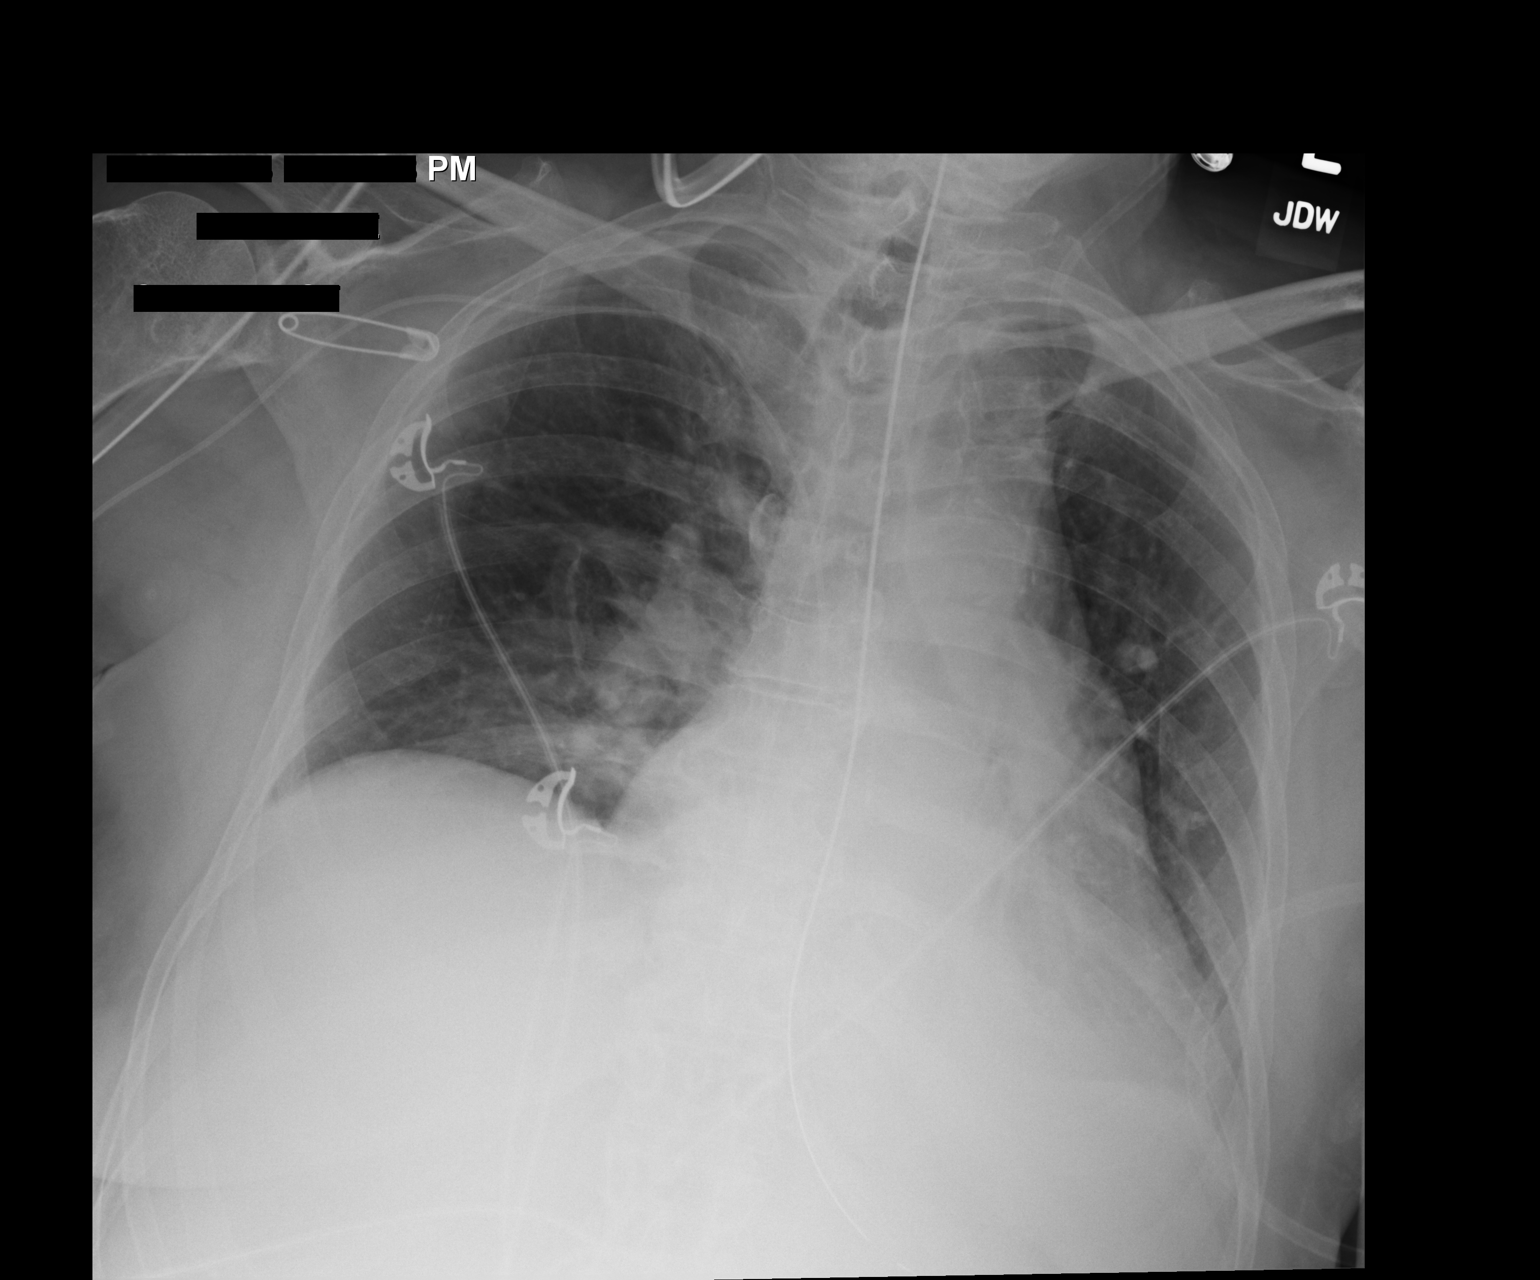

[1 of 1 positions shown; findings below may reference images not displayed]

FINDINGS: Right arm PICC line extends to the low SVC.  Heart size
upper limits normal for technique.  Low volumes with perihilar and
bibasilar subsegmental atelectasis or infiltrate, left greater than
right.  No effusion.  Nasogastric tube extends at least as far as
the stomach, tip not seen.
IMPRESSION: 1.  PICC line to low SVC.

## 2014-04-08 IMAGING — CR DG ABDOMEN 2V
2 series · 2 of 2 positions shown · non-contrast
Comparison: CT 05/16/2012

CLINICAL DATA: Small bowel obstruction, abdominal pain

ABDOMEN - 2 VIEW

[w abdomen decub]
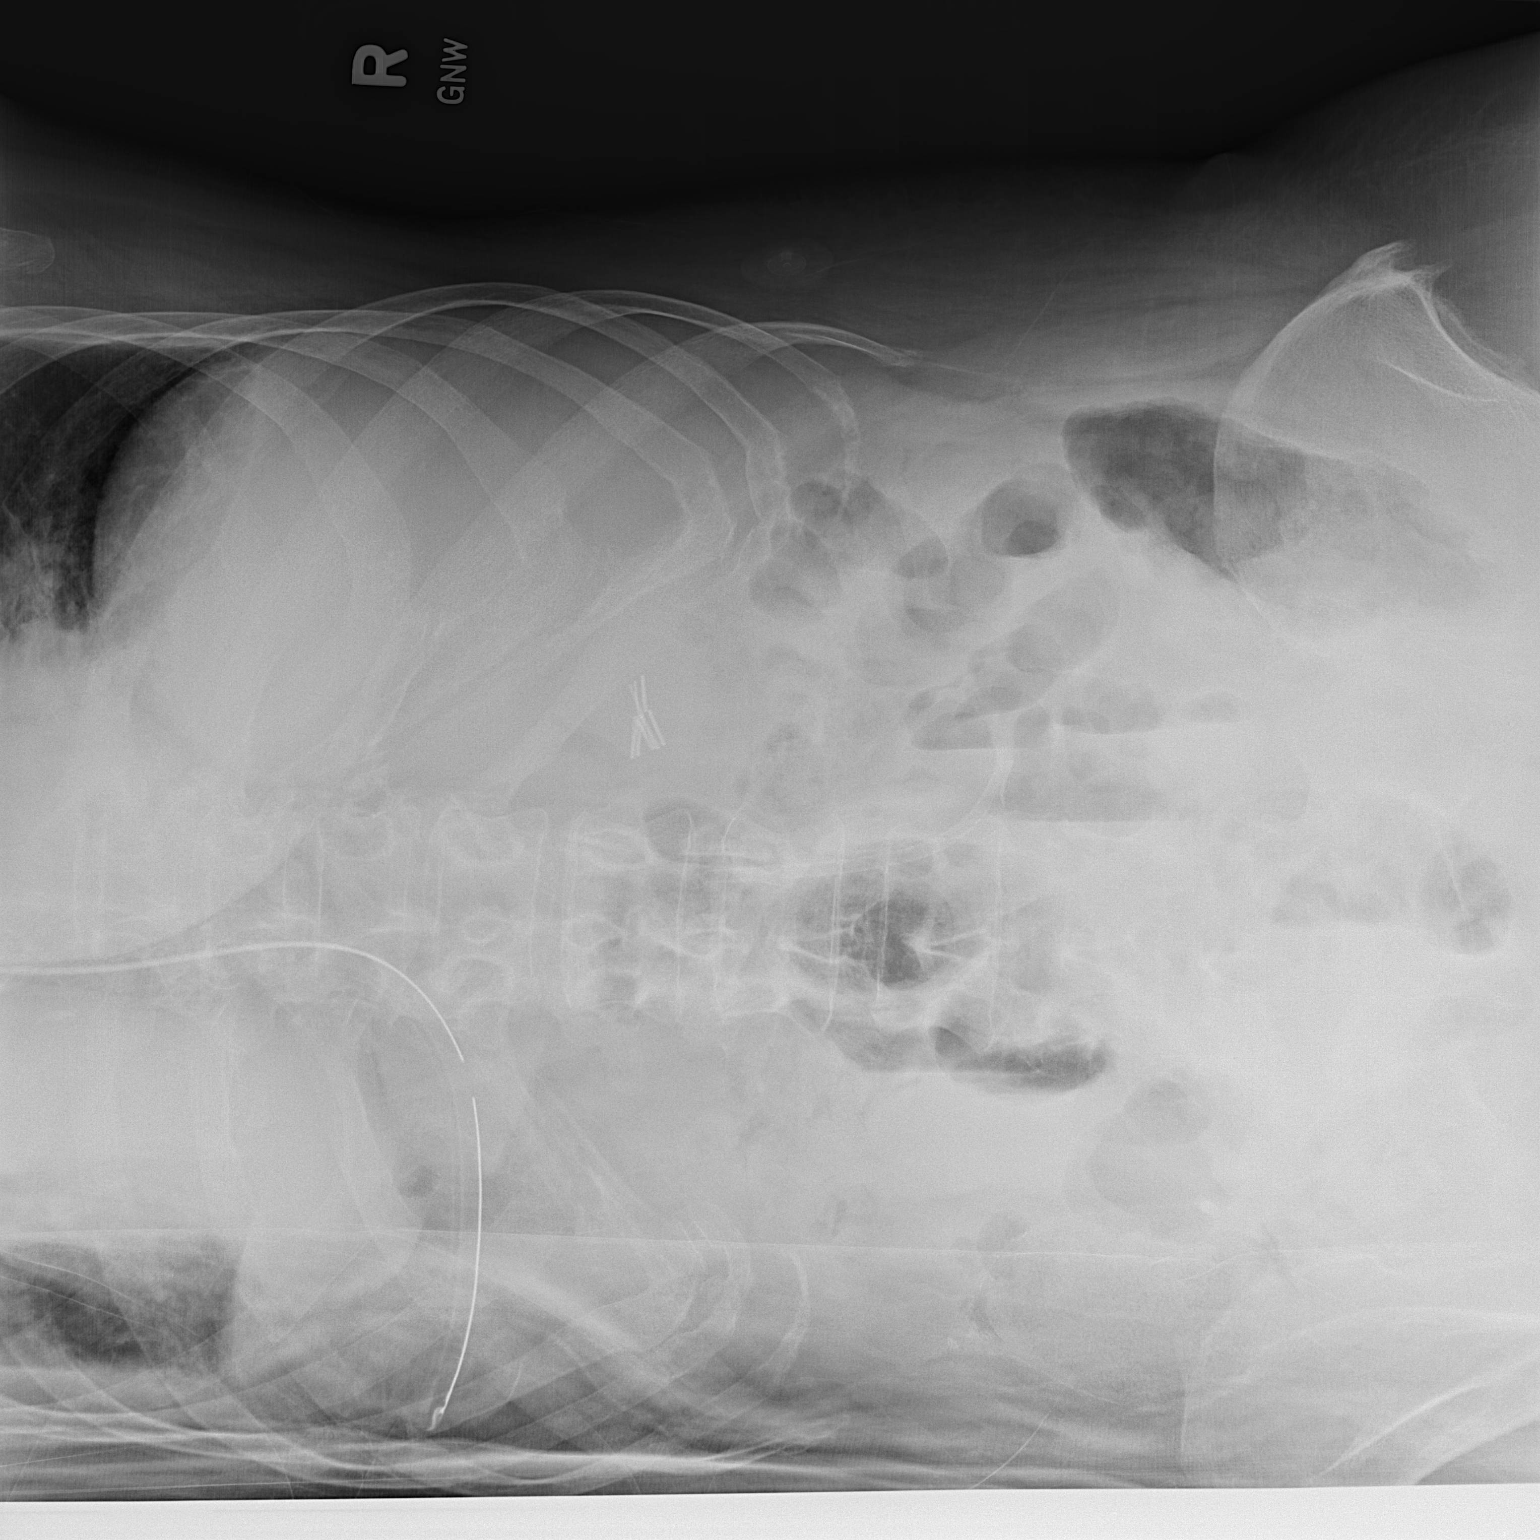

[x abdomen supine]
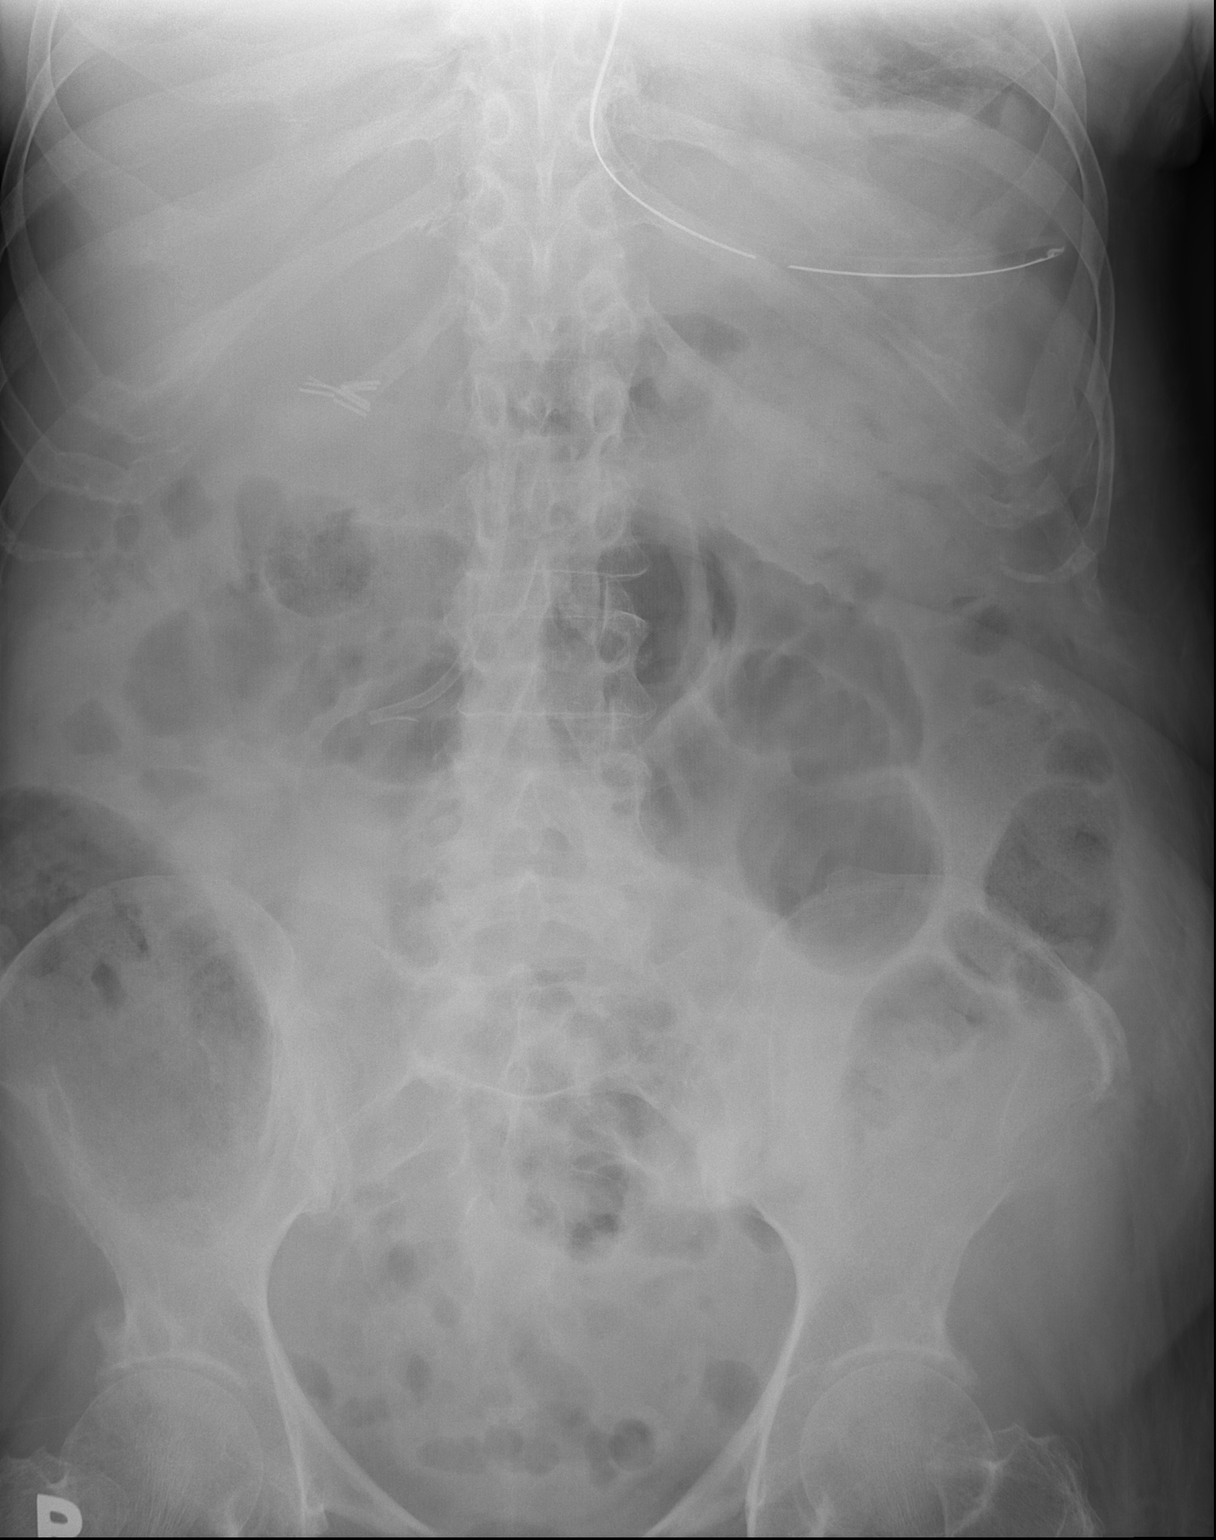

[2 of 2 positions shown; findings below may reference images not displayed]

FINDINGS: Nasogastric tube extends into the decompressed stomach.
Vascular clips in the right upper abdomen.  No free air on the left
lateral decubitus radiograph.  There are few gas dilated small
bowel loops in mid abdomen, with fluid levels on the decubitus
film.  Normal distribution of gas and stool throughout the
nondilated colon.  Plastic endoscopic biliary stent projects in
expected location.  Regional bones unremarkable.
IMPRESSION: 1.  Fluid levels in dilated small bowel loops suggesting persistent
mid small bowel obstruction.
2.  No free air.
3. Support hardware stable in position.

## 2014-04-08 MED ORDER — INDOMETHACIN 25 MG PO CAPS: 25.0000 mg | ORAL_CAPSULE | Freq: Three times a day (TID) | ORAL | Status: DC | PRN

## 2014-04-08 NOTE — Care Management Note (Addendum)
  Page 2 of 2   04/08/2014     12:01:08 PM CARE MANAGEMENT NOTE 04/08/2014  Patient:  SYNCERE, KAMINSKI   Account Number:  [id number]  Date Initiated:  04/08/2014  Documentation initiated by:  Ceejay Kegley  Subjective/Objective Assessment:   SOB, SBO     Action/Plan:   CM to follow for disposition needs   Anticipated DC Date:  04/08/2014   Anticipated DC Plan:  [place]  CM consult      Choice offered to / List presented to:  [name]        [place] arranged  HH-1 RN  [place]      [place].   Status of service:  Completed, signed off Medicare Important Message given?  YES (If response is "NO", the following Medicare IM given date fields will be blank) Date Medicare IM given:  04/07/2014 Medicare IM given by:  Hartley Urton Date Additional Medicare IM given:   Additional Medicare IM given by:    Discharge Disposition:  [place]  Per UR Regulation:  Reviewed for med. necessity/level of care/duration of stay  If discussed at [place] of Stay Meetings, dates discussed:   04/08/2014    Comments:  Mariann Laster RN, BSN, MSHL, CCM  Nurse - Case Manager,  ([facility])  [phone]  04/08/2014 Social:  From home with Friend Tolerated Diet advancement post NG removal PT RECS:  PT DME RECS:  None Disposition Plan:  HHS:  RN, PT ([place] / Butch Penny notified)

## 2014-04-08 NOTE — Progress Notes (Signed)
Agree with above, can be discharged from our standpoint

## 2014-04-08 NOTE — Progress Notes (Signed)
ANTICOAGULATION CONSULT NOTE - Follow Up Consult  Pharmacy Consult for Coumadin Indication: atrial fibrillation  Allergies  Allergen Reactions  . Iohexol      Code: VOM, Desc: PT REPORTS VOMITING W/ IVP DYE- ARS 12/26/08, Onset Date: [id number]   . Z-Pak [Azithromycin]   . Iodinated Diagnostic Agents Nausea Only    PT HAS HAD SINGULAR INCIDENT OF NAUSEA  W/ IV CONTYRAST INJECTION, NONE IF INJECTED SLOWLY//A.CALHOUN   Labs:  Recent Labs  04/06/14 0354 04/07/14 0249 04/08/14 0415  HGB 10.9* 11.5*  --   HCT 32.4* 33.1*  --   PLT 317 325  --   LABPROT 19.9* 20.4* 19.7*  INR 1.69* 1.75* 1.67*  CREATININE 1.12* 0.90 0.98    Estimated Creatinine Clearance: 47.1 ml/min (by C-G formula based on Cr of 0.98).   Medications:  Scheduled:  . atorvastatin  20 mg Oral q1800  . bisacodyl  10 mg Rectal Daily  . diltiazem  240 mg Oral Daily  . furosemide  40 mg Oral Daily  . insulin aspart  0-9 Units Subcutaneous TID WC  . levothyroxine  50 mcg Oral QAC breakfast  . magnesium oxide  400 mg Oral BID  . metoprolol  50 mg Oral BID  . potassium chloride  40 mEq Oral BID  . sodium chloride  3 mL Intravenous Q12H  . warfarin  2.5 mg Oral Q T,Th,S,Su-1800  . warfarin  5 mg Oral Q M,W,F-1800  . Warfarin - Pharmacist Dosing Inpatient   Does not apply q1800    Assessment: 80yo female with AFib.    INR at 1.67 (Coumadin held for several days)  No bleeding noted   Goal of Therapy:  INR 2-3 Monitor platelets by anticoagulation protocol: Yes   Plan:  1-  Continue Coumadin at home dose 5 mg MWF, 2.5 mg TTSS 2-  Daily INR  Thank you Anette Guarneri, PharmD [phone]

## 2014-04-08 NOTE — Progress Notes (Signed)
Physical Therapy Treatment Patient Details Name: DENISIA HARPOLE MRN: [id number] DOB: 09-07-1930 Today's Date: 04/08/2014    History of Present Illness SHALUNDA LINDH is a 80 y.o. female with PMH significnat for SOB, HTN, DM, CHF, AFIB on coumadin, AAA, colon CA, who presents with abdominal pain.  Pt found to have SBO and NG tube placed.    PT Comments    *Pt ambulated 240' without assistive device today and had no loss of balance. From PT standpoint she is ready to DC home. **  Follow Up Recommendations  Home health PT     Equipment Recommendations  None recommended by PT    Recommendations for Other Services       Precautions / Restrictions Precautions Precautions: None Restrictions Weight Bearing Restrictions: No    Mobility  Bed Mobility                  Transfers Overall transfer level: Independent Equipment used: None Transfers: Sit to/from Stand Sit to Stand: Independent            Ambulation/Gait Ambulation/Gait assistance: Independent Ambulation Distance (Feet): 240 Feet Assistive device: None (pushing IV pole) Gait Pattern/deviations: WFL(Within Functional Limits)   Gait velocity interpretation: Below normal speed for age/gender General Gait Details: steady without assistive device   Stairs            Wheelchair Mobility    Modified Rankin (Stroke Patients Only)       Balance     Sitting balance-Leahy Scale: Normal       Standing balance-Leahy Scale: Good                      Cognition Arousal/Alertness: Awake/alert Behavior During Therapy: WFL for tasks assessed/performed Overall Cognitive Status: Within Functional Limits for tasks assessed                      Exercises      General Comments        Pertinent Vitals/Pain Pain Assessment: No/denies pain    Home Living                      Prior Function            PT Goals (current goals can now be found in the care plan  section) Acute Rehab PT Goals Patient Stated Goal: To return to PLOF; pt is a hairdresser PT Goal Formulation: With patient Time For Goal Achievement: 04/19/14 Potential to Achieve Goals: Good Progress towards PT goals: Progressing toward goals    Frequency  Min 3X/week    PT Plan Current plan remains appropriate    Co-evaluation             End of Session Equipment Utilized During Treatment: Gait belt Activity Tolerance: Patient tolerated treatment well Patient left: in chair;with call bell/phone within reach;with family/visitor present     Time: 8101-7510 PT Time Calculation (min): 14 min  Charges:  $Gait Training: 8-22 mins                    G Codes:      Philomena Doheny 04/08/2014, 9:16 AM [phone]

## 2014-04-08 NOTE — Progress Notes (Signed)
[facility] Progress Note     Subjective: Pt tolerating solid diet, no N/V or abdominal pain.  Wants to go home.  Having BM's and flatus.  Objective: Vital signs in last 24 hours: Temp:  [97.6 F (36.4 C)-98.3 F (36.8 C)] 98 F (36.7 C) (09/17 0447) Pulse Rate:  [66-75] 68 (09/17 0447) Resp:  [18] 18 (09/17 0447) BP: (134-162)/(68-86) 134/68 mmHg (09/17 0447) SpO2:  [98 %-99 %] 98 % (09/17 0447) Weight:  [167 lb 15.9 oz (76.2 kg)] 167 lb 15.9 oz (76.2 kg) (09/17 0447) Last BM Date: 04/07/14  Intake/Output from previous day: 09/16 0701 - 09/17 0700 In: 1263 [P.O.:1260; I.V.:3] Out: 850 [Urine:850] Intake/Output this shift: Total I/O In: 240 [P.O.:240] Out: -   PE: Gen:  Alert, NAD, pleasant Abd: Soft, NT/ND, +BS, no HSM, abdominal scars noted   Lab Results:   Recent Labs  04/06/14 0354 04/07/14 0249  WBC 9.0 7.8  HGB 10.9* 11.5*  HCT 32.4* 33.1*  PLT 317 325   BMET  Recent Labs  04/07/14 0249 04/08/14 0415  NA 146 143  K 3.3* 3.9  CL 106 103  CO2 25 23  GLUCOSE 129* 104*  BUN 11 9  CREATININE 0.90 0.98  CALCIUM 8.6 9.2   PT/INR  Recent Labs  04/07/14 0249 04/08/14 0415  LABPROT 20.4* 19.7*  INR 1.75* 1.67*   CMP     Component Value Date/Time   NA 143 04/08/2014 0415   NA 142 12/03/2013 0833   K 3.9 04/08/2014 0415   CL 103 04/08/2014 0415   CO2 23 04/08/2014 0415   GLUCOSE 104* 04/08/2014 0415   GLUCOSE 86 12/03/2013 0833   BUN 9 04/08/2014 0415   BUN 19 12/03/2013 0833   CREATININE 0.98 04/08/2014 0415   CALCIUM 9.2 04/08/2014 0415   PROT 6.8 04/03/2014 0549   PROT 7.0 12/03/2013 0833   ALBUMIN 3.4* 04/03/2014 0549   AST 25 04/03/2014 0549   ALT 13 04/03/2014 0549   ALKPHOS 108 04/03/2014 0549   BILITOT 0.6 04/03/2014 0549   GFRNONAA 53* 04/08/2014 0415   GFRAA 61* 04/08/2014 0415   Lipase     Component Value Date/Time   LIPASE 47 04/02/2014 2233       Studies/Results: No results  found.  Anti-infectives: Anti-infectives   None       Assessment/Plan HD #6 pSBO  Multiple recurrent SBO - most recent 2 mo ago (July 2015)  N/V  Abdominal pain  H/o cholecystectomy, hemicolectomy (colon cancer 1999)   Plan:  1. Tolerating HH diet, having BM's 2. Ambulate and IS  3. SCD's, Ok to resume coumadin today if tolerates fulls  4. D/c when medically stable 5. Encourage a good bowel regimen at home to prevent constipation     LOS: 6 days    DORT, Jinny Blossom 04/08/2014, 9:11 AM Pager: [phone]

## 2014-04-08 NOTE — Discharge Summary (Addendum)
Physician Discharge Summary  VARSHINI ARRANTS ION:[id number] DOB: 30-Dec-1930 DOA: 04/02/2014  PCP: Hollace Kinnier, DO  Admit date: 04/02/2014 Discharge date: 04/08/2014  Time spent: 35 minutes  Recommendations for Outpatient Follow-up:  1. Follow up with PCP in 1-2 weeks 2. Repeat INR in 3-5 days  Discharge Diagnoses:  Principal Problem:   SOB (shortness of breath) Active Problems:   History of colon cancer, stage III   CHF, past NICM with an EF of 30%, most recent echo 2011 showed EF to be45- 50%   Type II or unspecified type diabetes mellitus with neurological manifestations, not stated as uncontrolled   HTN (hypertension)   Hypothyroidism  Discharge Condition: Improved  Diet recommendation: Diabetic  Filed Weights   04/06/14 0608 04/07/14 0544 04/08/14 0447  Weight: 76.2 kg (167 lb 15.9 oz) 76.9 kg (169 lb 8.5 oz) 76.2 kg (167 lb 15.9 oz)    History of present illness:  See admit h and p from 9/12 for details. Briefly, pt presents with nausea/vomiting and abd pain. Imaging in initial presentation was notable for small bowel obstruction. The patient was subsequently admitted for further workup.  Hospital Course:  1. Small bowel obstruction -Patient with history of abdominal surgeries and recurrent small bowel obstructions, presenting with complaints of nausea vomiting abdominal pain and abdominal distention.  -CT scan of abdomen and pelvis showing small bowel loops distended to 4.1 cm, with an apparent transition point at the mid ileum. Findings seem to be consistent with small bowel obstruction.  -General surgery consulted -Clinically much improved, NG tube was discontinued 9/15  -Pt advanced to regular diet. -Cleared to d/c per Surgery 2. Chronic systolic congestive heart failure.  -Last transthoracic echocardiogram performed 12/04/2009 which showed an ejection fraction of 45-50%  -Lasix initially held secondary to NPO status  -Diet now advanced, resumed Lasix at 40 mg  daily  3. Hypertension.  -Patient hypertensive with systolic blood pressures in the 150's  -Continued diltiazem to 40 mg by mouth daily, metoprolol 25 mg twice a day  -PRN IV labetolol was added for SBP's greater than 165 or HR's greater than 90  -As BP remained elevated, increased metoprolol to 50mg  bid  4. Type 2 diabetes mellitus.  -Hemoglobin A1c 5.5 on 01/26/2014  -Hypoglycemic agents were initially held as she had been made n.p.o.  -Sliding-scale coverage  -Would resume home meds on discharge  5. Atrial fibrillation.  -Continued Metoprolol and Cardizem per above  -Remains rate controlled -Would resume coumadin on d/c. Subtherapeutic currently. Will allow INR to slowly trend up and hold off on anticoagulation bridge given concerns of possible bleeding risk with dual anticoagulants, especially with documented hx of aortic aneurysm. No history of CVA noted.  Consultations:  Surgery  Discharge Exam: Filed Vitals:   04/07/14 0833 04/07/14 1407 04/07/14 2019 04/08/14 0447  BP: 162/88 143/86 162/80 134/68  Pulse: 70 66 75 68  Temp: 98.4 F (36.9 C) 98.3 F (36.8 C) 97.6 F (36.4 C) 98 F (36.7 C)  TempSrc: Oral Oral Oral Oral  Resp: 18 18 18 18   Height:      Weight:    76.2 kg (167 lb 15.9 oz)  SpO2: 98% 99% 99% 98%   General: Awake, in nad Cardiovascular: regular, s1, s2 Respiratory: normal resp effort, no wheezing  Discharge Instructions     Medication List         diltiazem 240 MG 24 hr capsule  Commonly known as:  CARTIA XT  Take 1 capsule (240 mg total)  by mouth daily.     furosemide 40 MG tablet  Commonly known as:  LASIX  Take 40 mg by mouth 2 (two) times daily.     glipiZIDE 10 MG tablet  Commonly known as:  GLUCOTROL  Take 10 mg by mouth daily before breakfast.     indomethacin 25 MG capsule  Commonly known as:  INDOCIN  Take 1 capsule (25 mg total) by mouth 3 (three) times daily as needed.     levothyroxine 50 MCG tablet  Commonly known as:   SYNTHROID, LEVOTHROID  Take 1 tablet (50 mcg total) by mouth daily.     magnesium oxide 400 (241.3 MG) MG tablet  Commonly known as:  MAG-OX  Take 400 mg by mouth 2 (two) times daily.     metFORMIN 500 MG tablet  Commonly known as:  GLUCOPHAGE  Take 1 tablet (500 mg total) by mouth 2 (two) times daily with a meal.     metoprolol tartrate 25 MG tablet  Commonly known as:  LOPRESSOR  Take 1 tablet (25 mg total) by mouth 2 (two) times daily.     polyethylene glycol packet  Commonly known as:  MIRALAX / GLYCOLAX  Take 17 g by mouth daily as needed for mild constipation.     potassium chloride SA 20 MEQ tablet  Commonly known as:  K-DUR,KLOR-CON  Take 20 mEq by mouth daily.     simvastatin 40 MG tablet  Commonly known as:  ZOCOR  Take 40 mg by mouth daily.     warfarin 5 MG tablet  Commonly known as:  COUMADIN  - Take 2.5-5 mg by mouth See admin instructions. Take 1 tablet Monday ,Wednesday and Friday.   - Take 0.5 tablet on Tuesday, Thursday, Saturday and Sunday.       Allergies  Allergen Reactions  . Iohexol      Code: VOM, Desc: PT REPORTS VOMITING W/ IVP DYE- ARS 12/26/08, Onset Date: [id number]   . Z-Pak [Azithromycin]   . Iodinated Diagnostic Agents Nausea Only    PT HAS HAD SINGULAR INCIDENT OF NAUSEA  W/ IV CONTYRAST INJECTION, NONE IF INJECTED SLOWLY//A.CALHOUN   Follow-up Information   Follow up with [name], [name], DO. Schedule an appointment as soon as possible for a visit in 1 week.   Specialty:  Geriatric Medicine   Contact information:   [address] [place] [address] [phone]        The results of significant diagnostics from this hospitalization (including imaging, microbiology, ancillary and laboratory) are listed below for reference.    Significant Diagnostic Studies: Ct Abdomen Pelvis Wo Contrast  04/03/2014   CLINICAL DATA:  Severe abdominal pain, nausea and vomiting.  EXAM: CT ABDOMEN AND PELVIS WITHOUT CONTRAST  TECHNIQUE: Multidetector  CT imaging of the abdomen and pelvis was performed following the standard protocol without IV contrast.  COMPARISON:  CT of the abdomen and pelvis from 01/23/2014  FINDINGS: Minimal bibasilar atelectasis is noted.  The liver is unremarkable in appearance. Splenosis is noted at the left upper quadrant. The patient is status post cholecystectomy, with clips noted along the gallbladder fossa. The pancreas and adrenal glands are unremarkable.  Mild bilateral renal atrophy and left renal scarring are noted. The kidneys are otherwise unremarkable. Mild nonspecific perinephric stranding is noted bilaterally. There is no evidence of hydronephrosis. No renal or ureteral stones are seen.  There is distention of small-bowel loops to 4.1 cm in maximal diameter, with gradual dilution of contrast to the  level of an apparent transition point at the mid ileum at the left lower quadrant. There is fecalization of the ileum just proximal to the transition point. This raises question for partial small bowel obstruction, possibly due to an adhesion.  More distal small bowel loops are mostly decompressed, though trace air is still seen within distal small bowel loops. No free fluid is identified. The stomach is within normal limits. No acute vascular abnormalities are seen. Scattered calcification is seen along the abdominal aorta and its branches.  The appendix is normal in caliber and contains air, without evidence for appendicitis. Scattered diverticulosis is noted along the ascending and proximal transverse colon. A bowel suture line is noted along the descending colon. Minimal diverticulosis is seen along the sigmoid colon. The colon is otherwise unremarkable.  The bladder is mildly distended and grossly unremarkable. The patient is status post hysterectomy. No suspicious adnexal masses are seen. The ovaries are relatively symmetric. No inguinal lymphadenopathy is seen.  No acute osseous abnormalities are identified.  IMPRESSION: 1.  Distention of small-bowel loops to 4.1 cm in maximal diameter, with gradual dilution of contrast and an apparent transition point at the mid ileum at the left lower quadrant. There is fecalization of the ileum just proximal to the transition point, raising concern for partial small-bowel obstruction, possibly due to an adhesion. More distal small bowel loops are mostly decompressed, though trace air is still seen within distal small bowel loops. 2. Scattered calcification along the abdominal aorta and its branches. 3. Scattered diverticulosis along the ascending and proximal transverse colon. Minimal diverticulosis along the sigmoid colon. 4. Splenosis noted at the left upper quadrant. 5. Mild bilateral renal atrophy and left renal scarring noted.   Electronically Signed   By: Garald Balding M.D.   On: 04/03/2014 02:44   Dg Abd 1 View  04/04/2014   CLINICAL DATA:  Bowel obstruction.  EXAM: ABDOMEN - 1 VIEW  COMPARISON:  04/03/2014.  FINDINGS: Gas, stool and residual oral contrast material are noted throughout the colon extending to the level of the distal rectum. There are a few prominent but nondilated loops of gas-filled small bowel throughout the central abdomen, measuring up to 3.7 cm in diameter, which are nonspecific. No pathologic dilatation of small bowel. No gross evidence of pneumoperitoneum. Nasogastric tube is present with tip terminating in the mid stomach. Surgical clips project over the right upper quadrant of the abdomen, compatible with prior cholecystectomy.  IMPRESSION: 1. Nonspecific, nonobstructive bowel gas pattern. 2. No pneumoperitoneum.   Electronically Signed   By: Vinnie Langton M.D.   On: 04/04/2014 11:31   Dg Abd Portable 1v  04/03/2014   CLINICAL DATA:  Check placement of nasogastric tube.  EXAM: PORTABLE ABDOMEN - 1 VIEW  COMPARISON:  Abdominal radiograph performed 01/28/2014, and CT of the abdomen and pelvis performed earlier today at 2:31 a.m.  FINDINGS: The enteric tube is  noted ending at the body of the stomach.  The visualized bowel gas pattern is unremarkable. Scattered air and stool filled loops of colon are seen; no abnormal dilatation of small bowel loops is seen to suggest small bowel obstruction. No free intra-abdominal air is identified, though evaluation for free air is limited on a single supine view. Clips are noted within the right upper quadrant, reflecting prior cholecystectomy.  The visualized osseous structures are within normal limits; the sacroiliac joints are unremarkable in appearance. The visualized lung bases are essentially clear.  IMPRESSION: Enteric tube noted ending at the body of  the stomach.   Electronically Signed   By: Garald Balding M.D.   On: 04/03/2014 06:54    Microbiology: No results found for this or any previous visit (from the past 240 hour(s)).   Labs: Basic Metabolic Panel:  Recent Labs Lab 04/04/14 0730 04/05/14 0415 04/06/14 0354 04/07/14 0249 04/08/14 0415  NA 146 146 148* 146 143  K 3.5* 3.1* 3.1* 3.3* 3.9  CL 101 104 106 106 103  CO2 33* 29 28 25 23   GLUCOSE 133* 97 76 129* 104*  BUN 19 14 16 11 9   CREATININE 1.37* 1.12* 1.12* 0.90 0.98  CALCIUM 9.3 9.0 9.0 8.6 9.2   Liver Function Tests:  Recent Labs Lab 04/02/14 2233 04/03/14 0549  AST 16 25  ALT 12 13  ALKPHOS 109 108  BILITOT 0.5 0.6  PROT 7.6 6.8  ALBUMIN 3.8 3.4*    Recent Labs Lab 04/02/14 2233  LIPASE 47   No results found for this basename: AMMONIA,  in the last 168 hours CBC:  Recent Labs Lab 04/02/14 2233 04/03/14 0549 04/04/14 0730 04/05/14 0415 04/06/14 0354 04/07/14 0249  WBC 11.6* 16.8* 11.5* 10.0 9.0 7.8  NEUTROABS 9.2*  --   --   --   --   --   HGB 12.2 11.7* 11.8* 11.5* 10.9* 11.5*  HCT 35.0* 34.0* 34.3* 34.1* 32.4* 33.1*  MCV 78.3 79.1 79.6 80.8 80.0 79.2  PLT 335 325 334 317 317 325   Cardiac Enzymes: No results found for this basename: CKTOTAL, CKMB, CKMBINDEX, TROPONINI,  in the last 168 hours BNP: BNP  (last 3 results)  Recent Labs  04/03/14 0549  PROBNP 1015.0*   CBG:  Recent Labs Lab 04/07/14 0848 04/07/14 1059 04/07/14 1648 04/07/14 2055 04/08/14 0624  GLUCAP 168* 171* 91 122* 101*   Signed:  Jazalynn Mireles K  [facility] 04/08/2014, 10:34 AM

## 2014-04-09 DIAGNOSIS — I509 Heart failure, unspecified: Secondary | ICD-10-CM

## 2014-04-09 DIAGNOSIS — I10 Essential (primary) hypertension: Secondary | ICD-10-CM

## 2014-04-09 DIAGNOSIS — E119 Type 2 diabetes mellitus without complications: Secondary | ICD-10-CM

## 2014-04-09 DIAGNOSIS — I4891 Unspecified atrial fibrillation: Secondary | ICD-10-CM

## 2014-04-09 IMAGING — CR DG ABD PORTABLE 2V
2 series · 2 of 2 positions shown · non-contrast
Comparison: 05/18/2012.

CLINICAL DATA: Ileus versus small bowel obstruction.

PORTABLE ABDOMEN - 2 VIEW

[AP (1 of 2)]
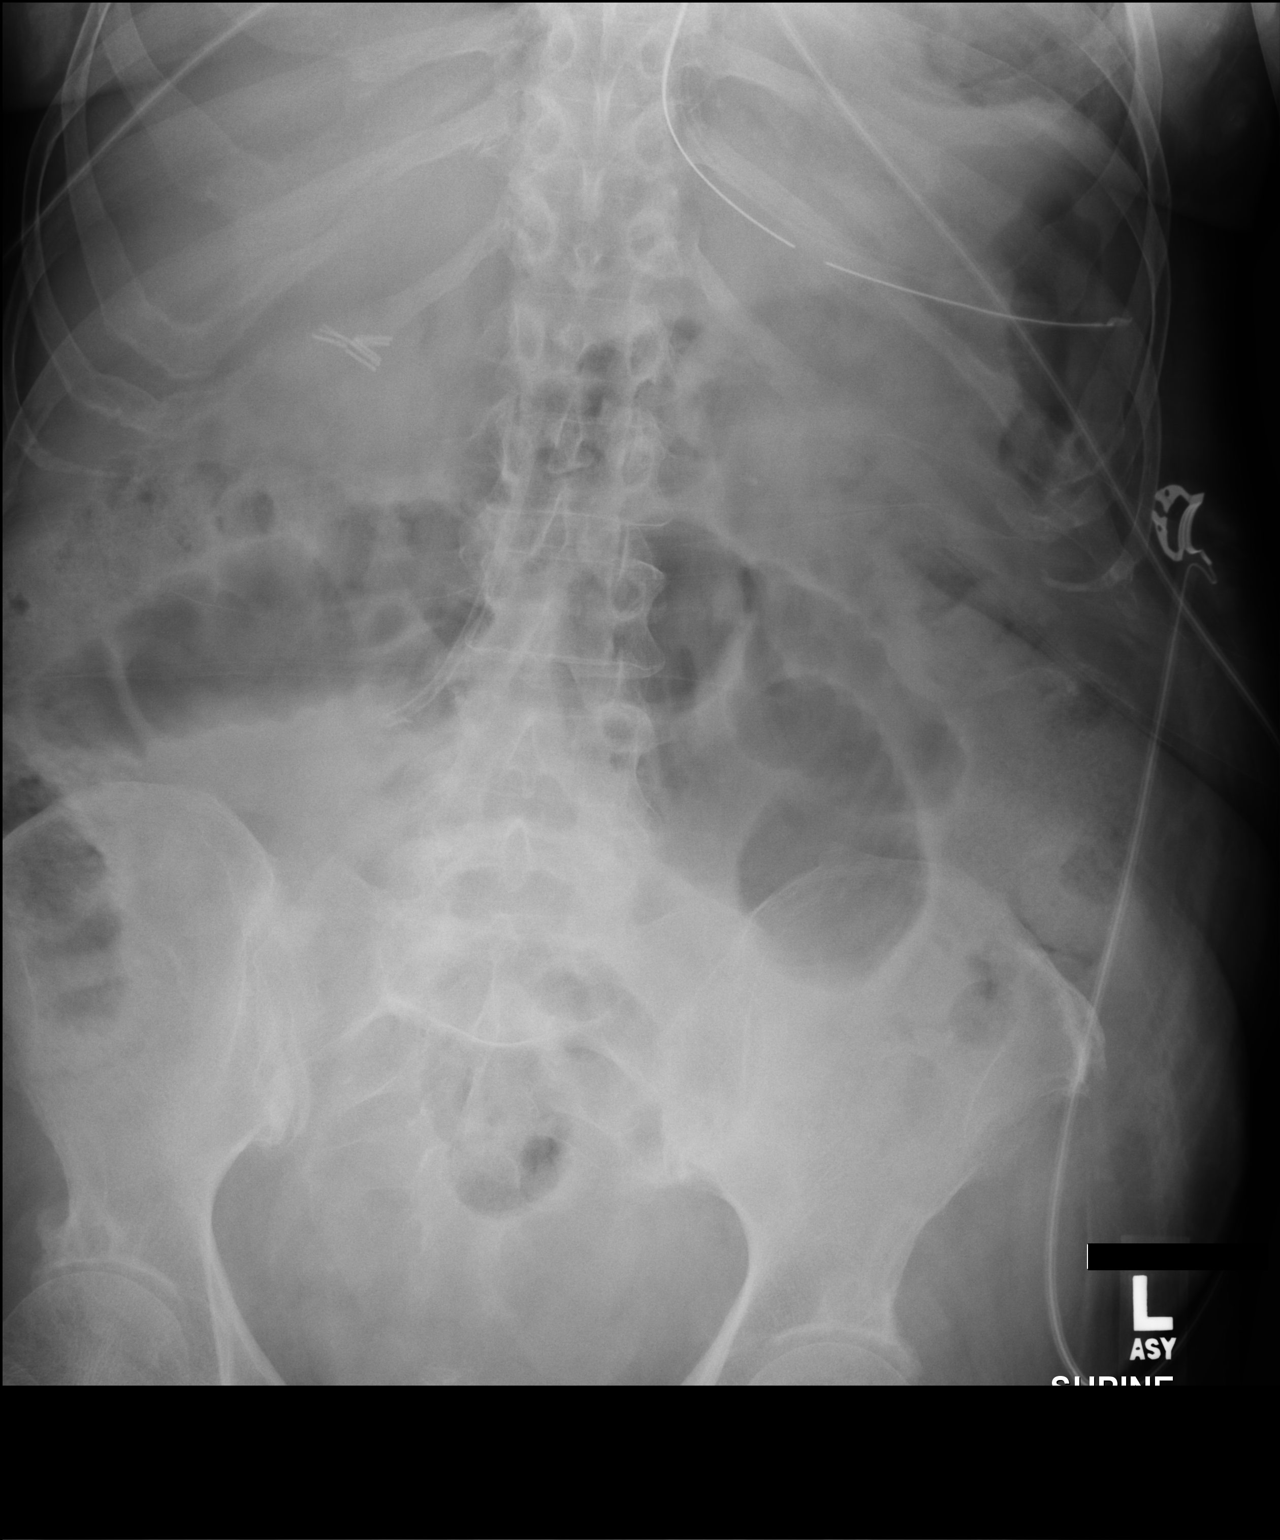

[AP (2 of 2)]
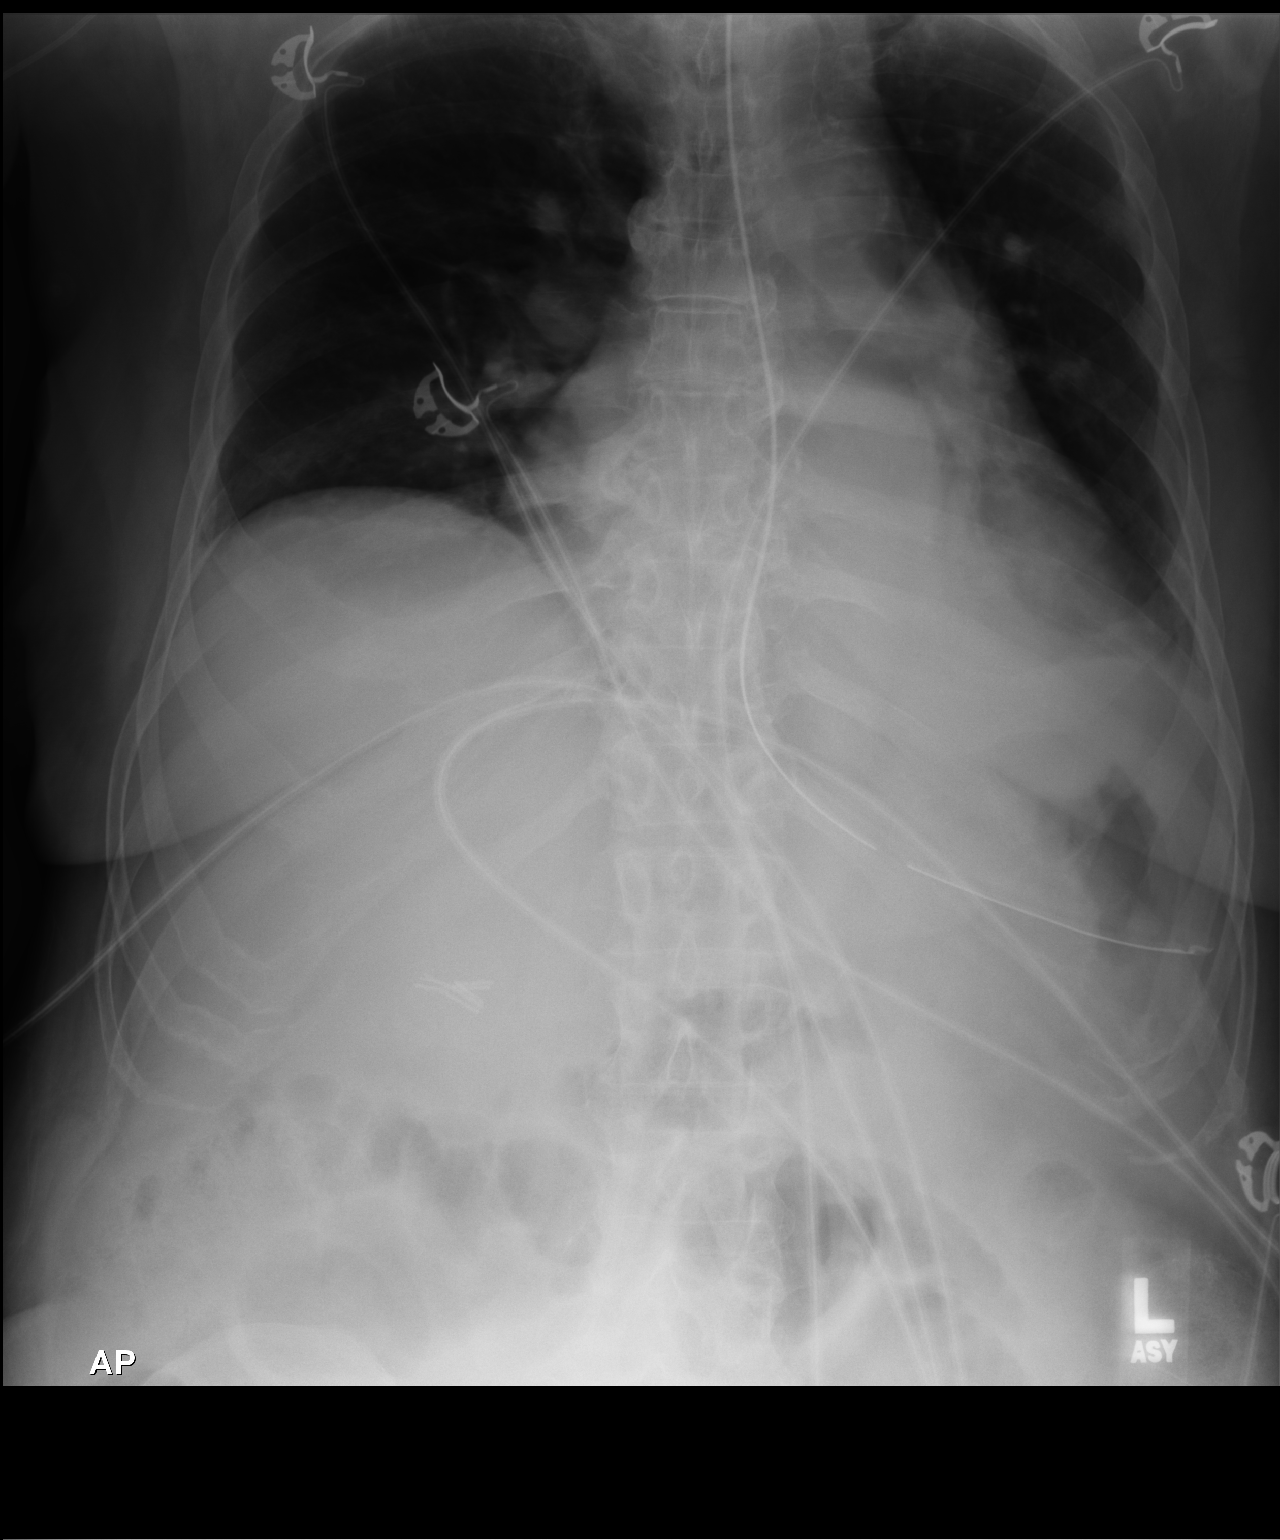

[2 of 2 positions shown; findings below may reference images not displayed]

FINDINGS: There are dilated loops of small bowel in the central
abdomen, measuring up to 4.7 cm in diameter.  Nasogastric
terminates in the stomach.  Minimal colonic gas.  There is gas in
the rectum.
IMPRESSION: Bowel gas pattern appears relatively unchanged from 05/18/2012,
favoring a small bowel obstruction.

## 2014-04-09 IMAGING — CR DG CHEST 1V PORT
1 series · 1 of 1 positions shown · non-contrast
Comparison: One-view chest 05/18/2012.

CLINICAL DATA: PICC line placement.

PORTABLE CHEST - 1 VIEW

[AP]
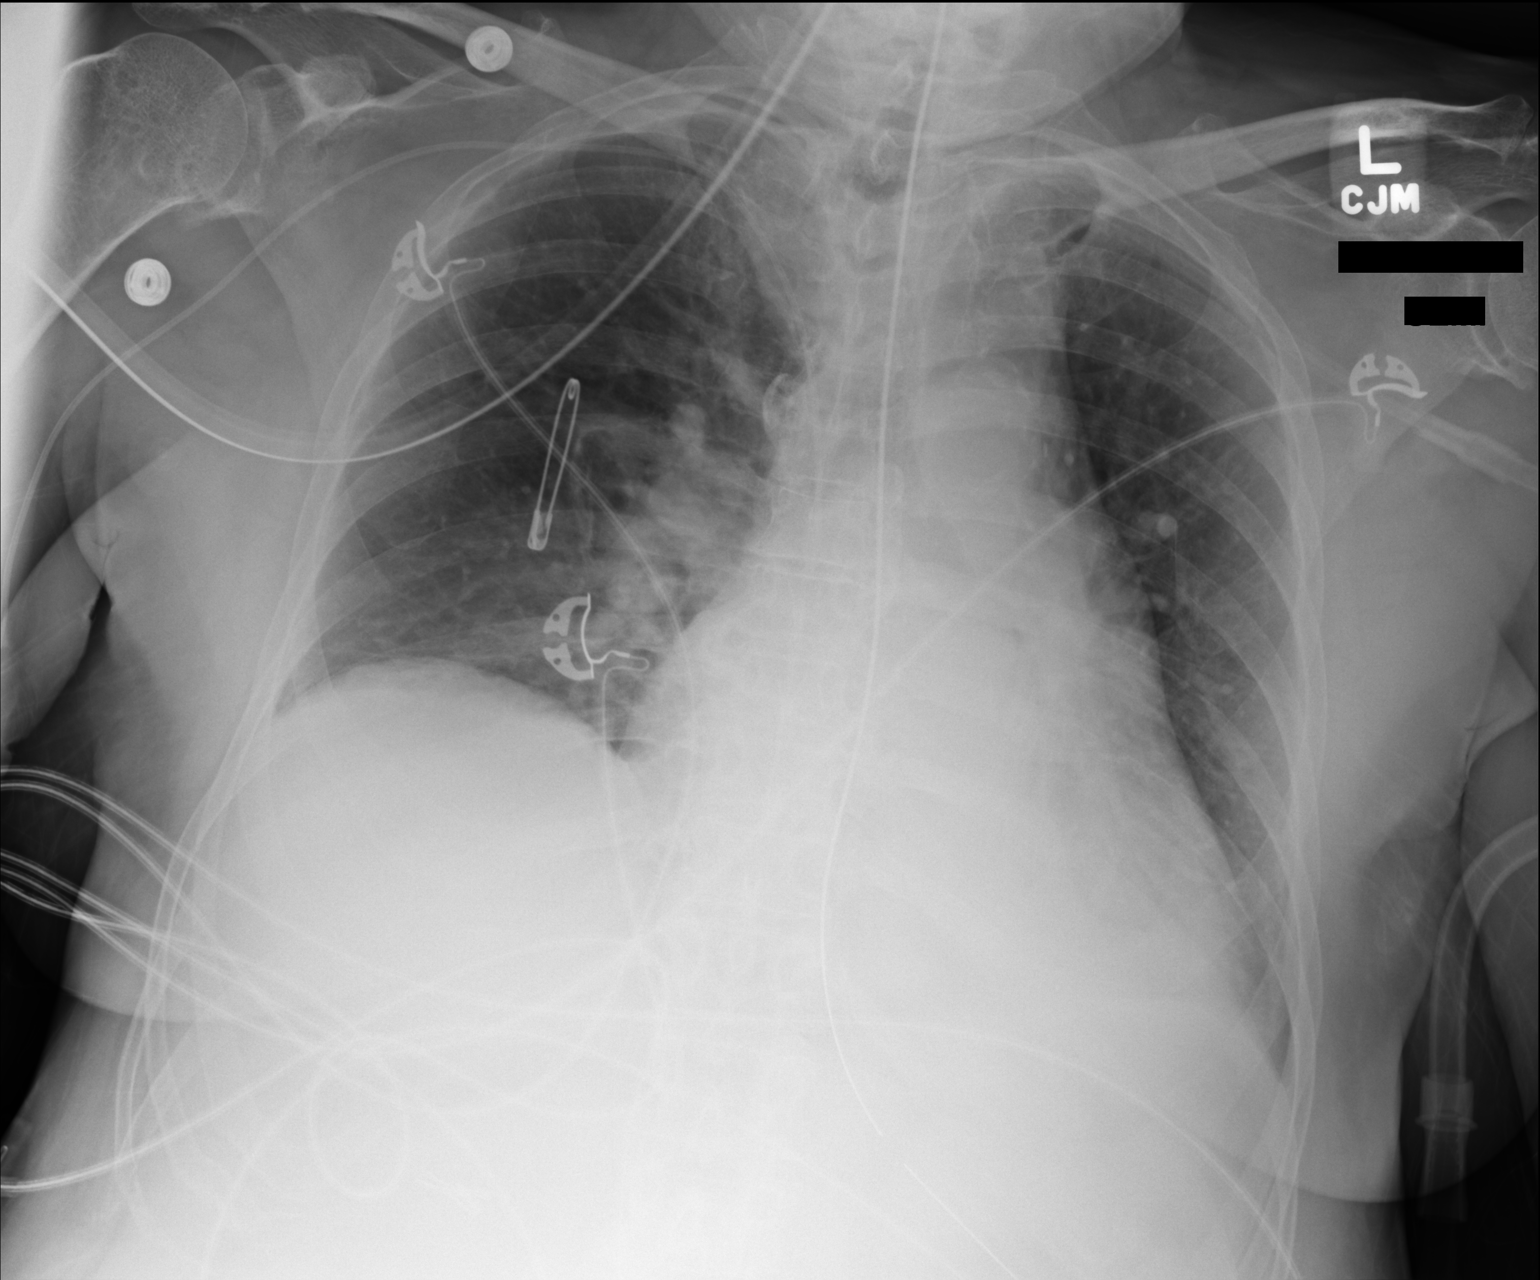

[1 of 1 positions shown; findings below may reference images not displayed]

FINDINGS: Mild cardiac enlargement is stable.  The sideport of an
NG tube is in the stomach.  A new right-sided PICC line is in
place.  The tip is in the distal SVC, above the cavoatrial
junction.  The lung volumes remain low.  Mild pulmonary vascular
congestion is stable.
IMPRESSION: 1.  Stable position of the right-sided PICC line in the lower SVC.
The
2.  Persistent low lung volumes.
3.  Mild cardiomegaly and pulmonary vascular congestion.

## 2014-04-11 IMAGING — CR DG ABD PORTABLE 1V
1 series · 1 of 1 positions shown · non-contrast
Comparison: Abdomen films of 05/19/2012

CLINICAL DATA: Ileus versus small bowel obstruction, follow-up

PORTABLE ABDOMEN - 1 VIEW

[AP]
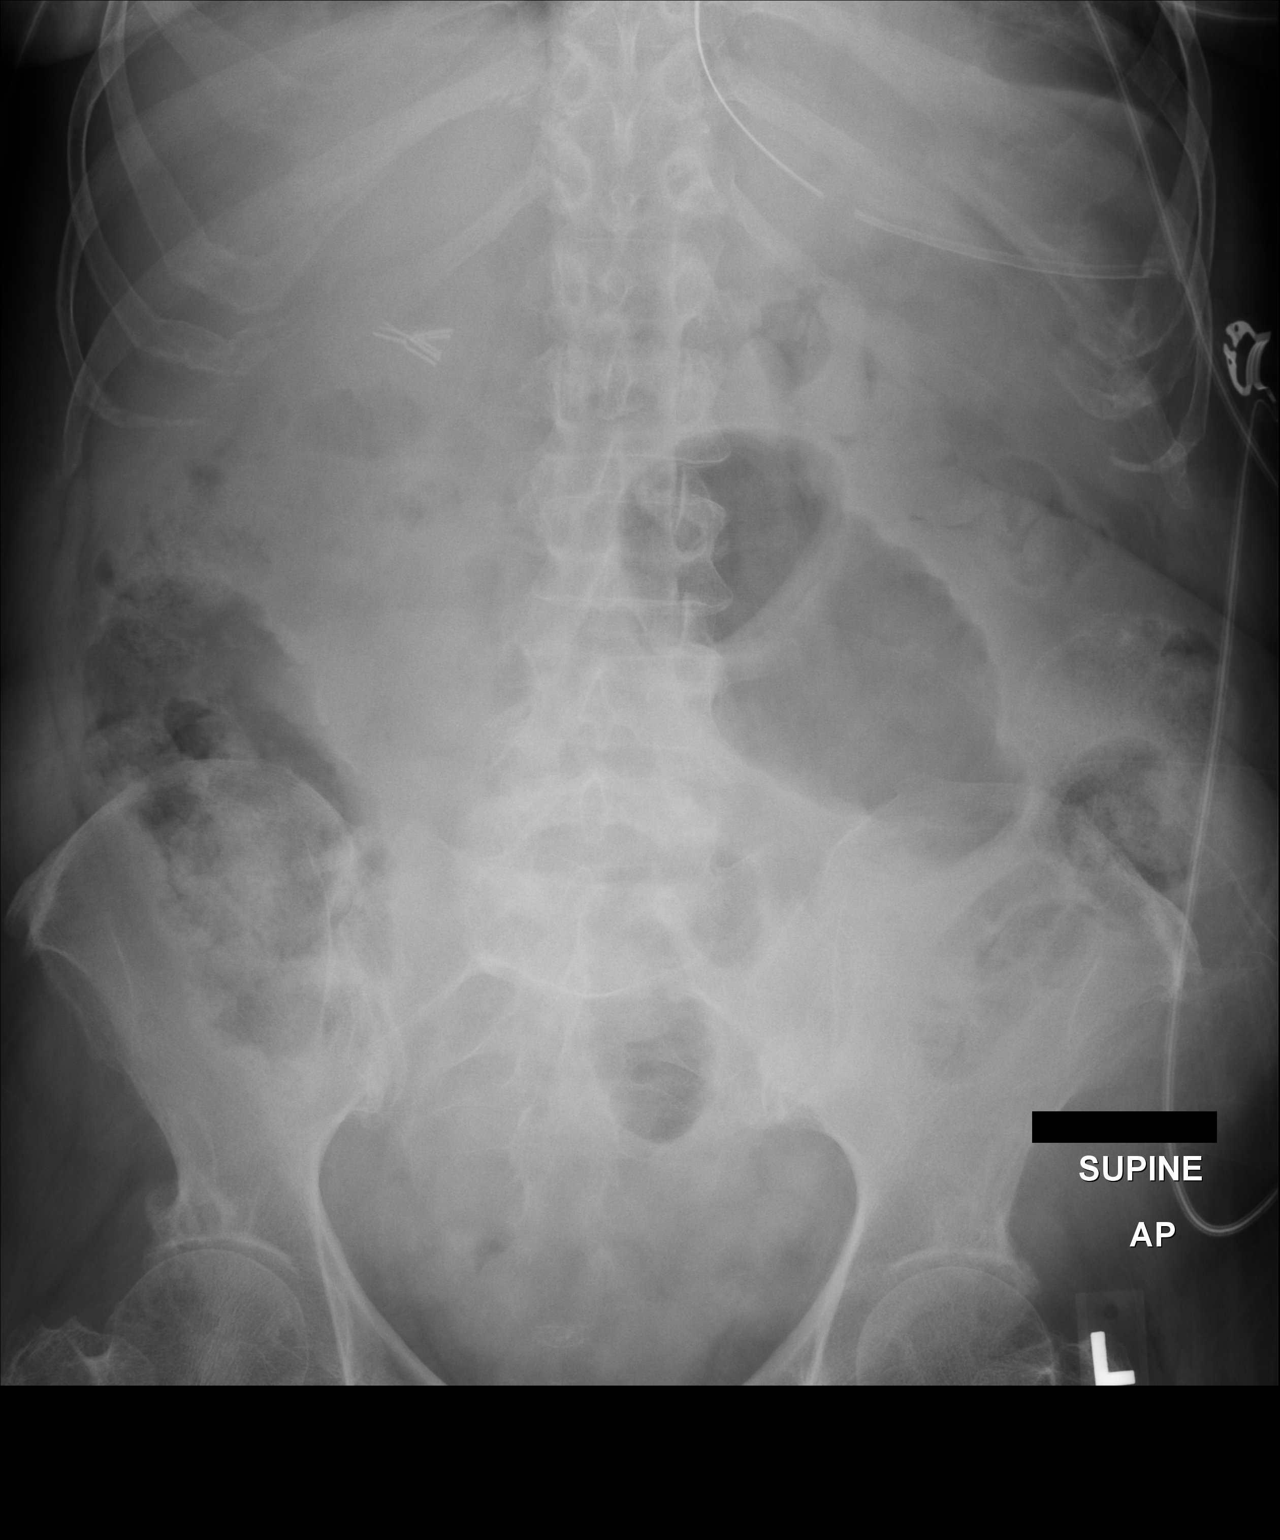

[1 of 1 positions shown; findings below may reference images not displayed]

FINDINGS: There is still a significantly dilated loop of small
bowel in the left mid abdomen suspicious for persistent small bowel
obstruction.  Some colonic bowel gas is seen but the colon is not
distended.  An NG tube remains.
IMPRESSION: Persistently dilated small bowel loop in the left midabdomen
consistent with partial small bowel obstruction.

## 2014-04-12 ENCOUNTER — Ambulatory Visit: Payer: Medicare Other | Admitting: Internal Medicine

## 2014-04-12 ENCOUNTER — Ambulatory Visit: Payer: Medicare Other | Admitting: Pharmacotherapy

## 2014-04-12 ENCOUNTER — Encounter: Payer: Self-pay | Admitting: Internal Medicine

## 2014-04-12 DIAGNOSIS — Z0289 Encounter for other administrative examinations: Secondary | ICD-10-CM

## 2014-04-12 IMAGING — CT CT ABDOMEN W/O CM
2 of 4 series · 16 of 46 positions shown, 18 images · non-contrast
Comparison: CT scan dated 05/16/2012

CLINICAL DATA: Abdominal pain and leukocytosis. Bile leak.

CT ABDOMEN WITHOUT CONTRAST
TECHNIQUE: Multidetector CT imaging of the abdomen was performed
following the standard protocol without IV contrast.

[Series 2: abd/pelv w/o 5.0 b31f st · axial · non-contrast · 0.79mm/px · z∈[-298,-73]mm · 13 of 51 slices shown, 15 images]
[im 3/51  soft-tissue]
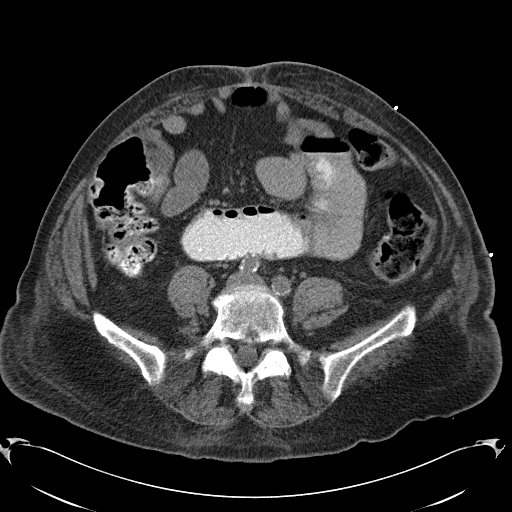
[im 3/51  bone]
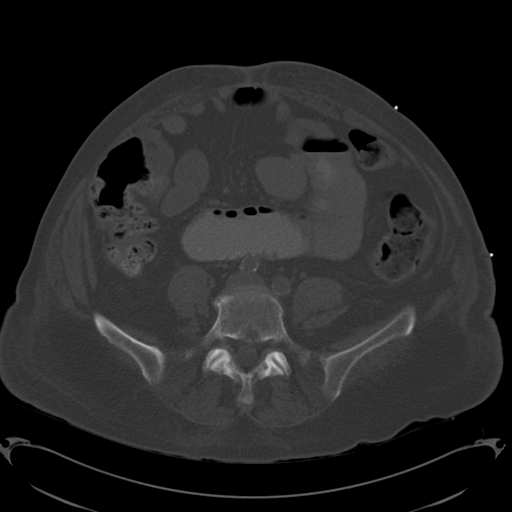
[im 7/51  soft-tissue]
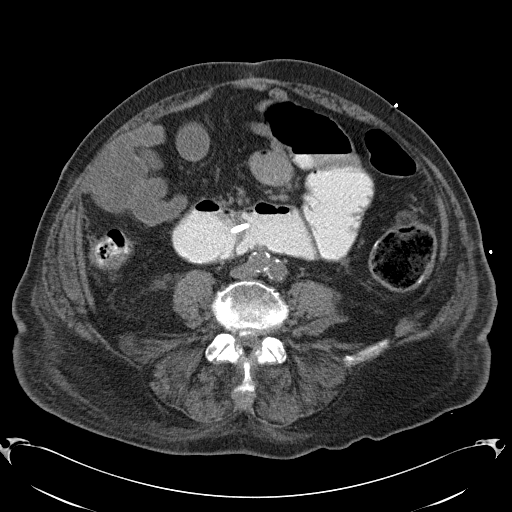
[im 12/51  soft-tissue]
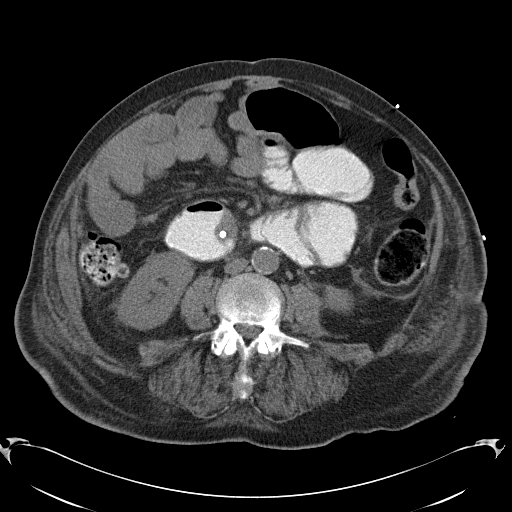
[im 14/51  soft-tissue]
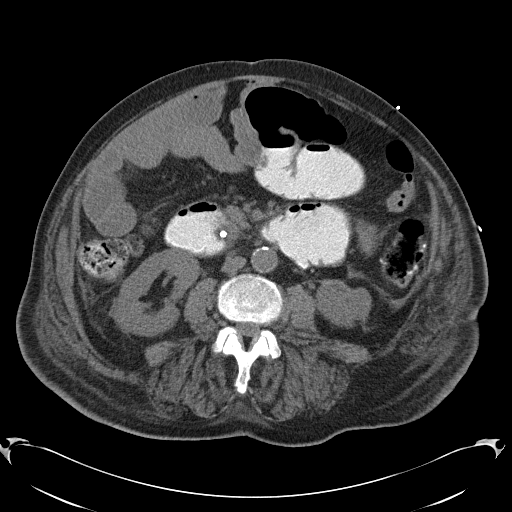
[im 19/51  soft-tissue]
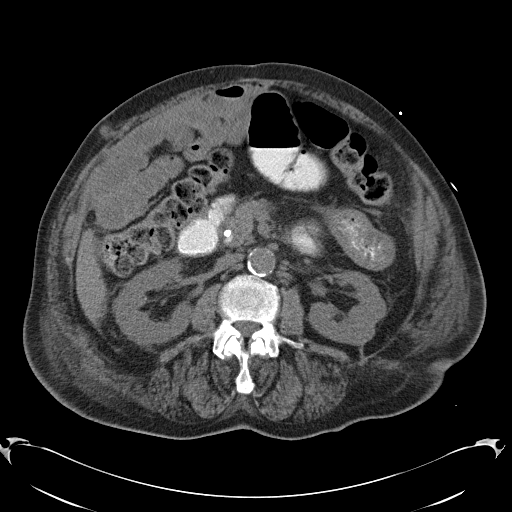
[im 21/51  soft-tissue]
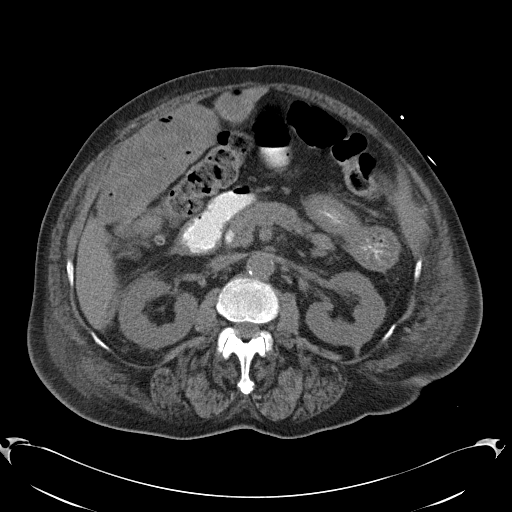
[im 26/51  soft-tissue]
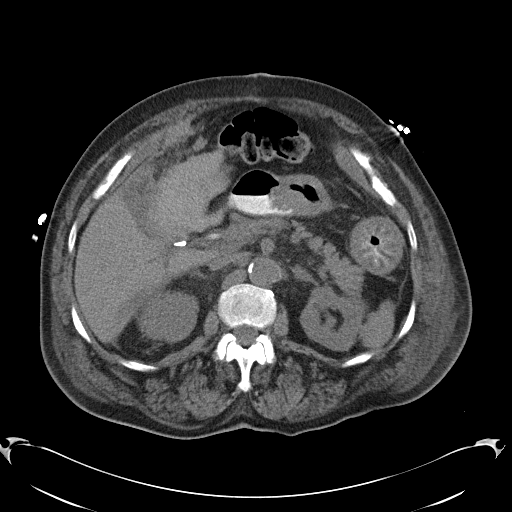
[im 30/51  soft-tissue]
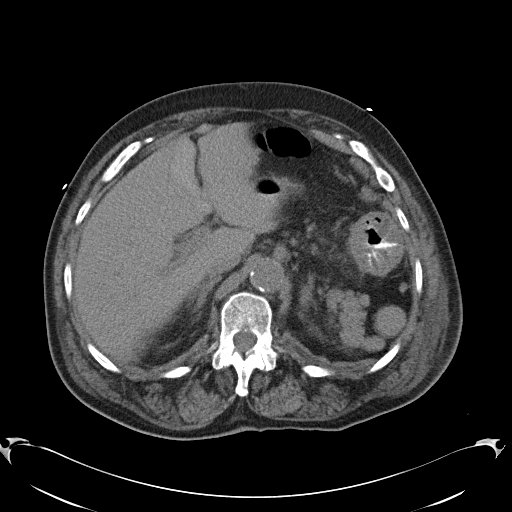
[im 32/51  soft-tissue]
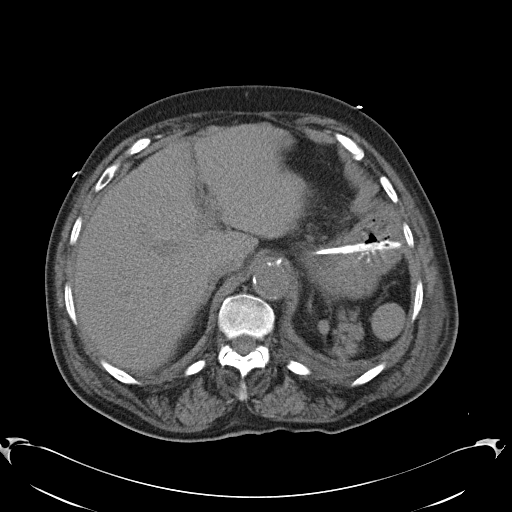
[im 32/51  bone]
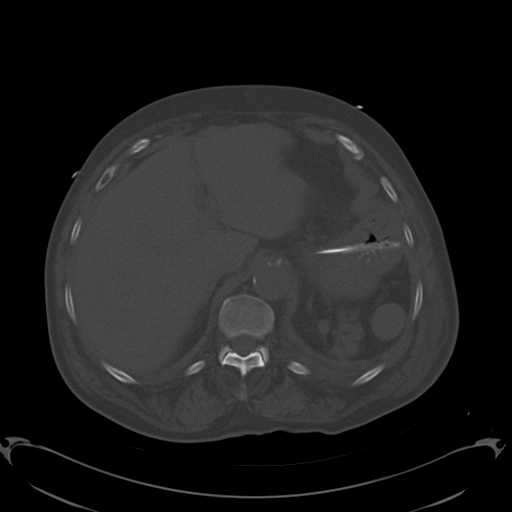
[im 37/51  soft-tissue]
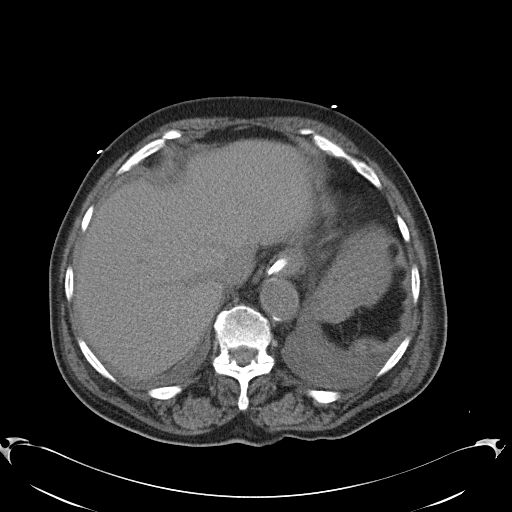
[im 39/51  soft-tissue]
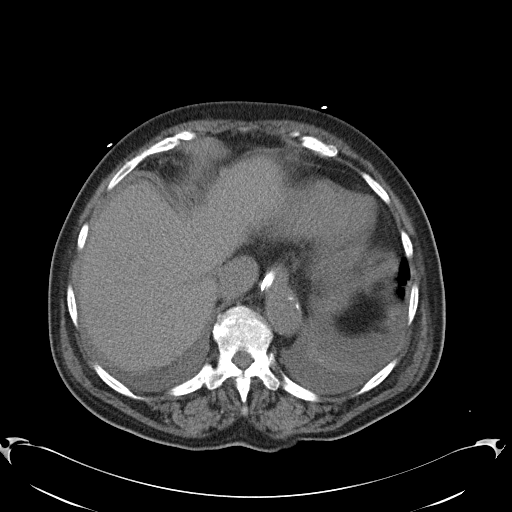
[im 44/51  soft-tissue]
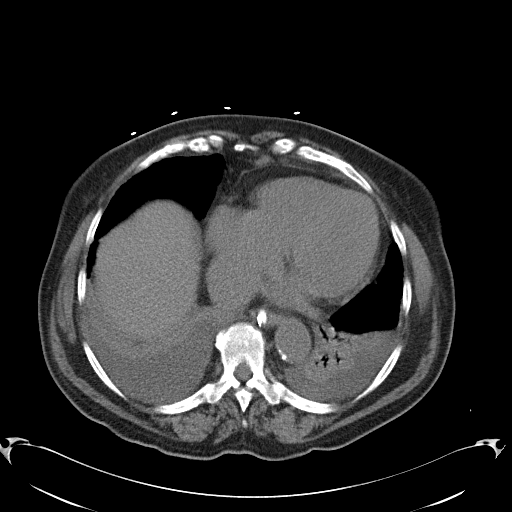
[im 48/51  soft-tissue]
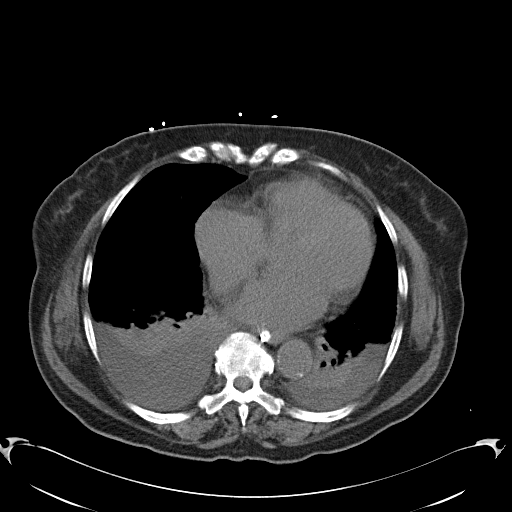

[Series 5: coronals · coronal · 0.58mm/px · 3 of 147 slices shown]
[im 49/147  soft-tissue]
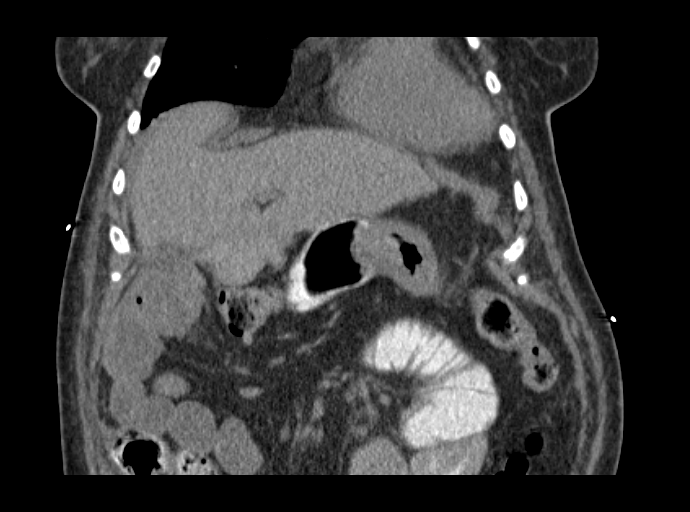
[im 65/147  soft-tissue]
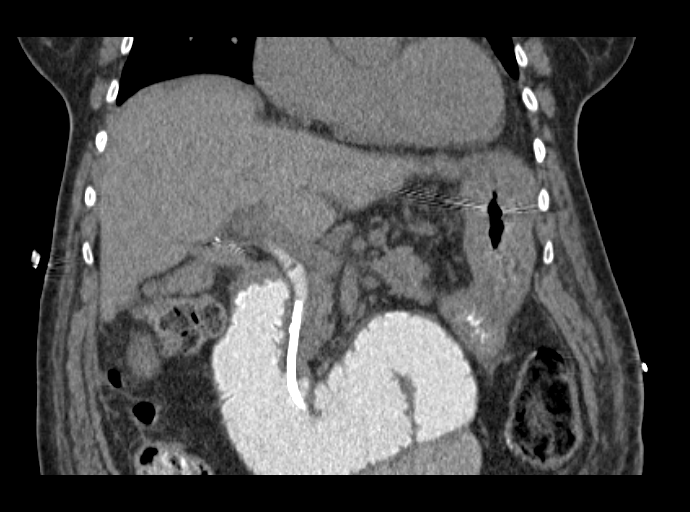
[im 82/147  soft-tissue]
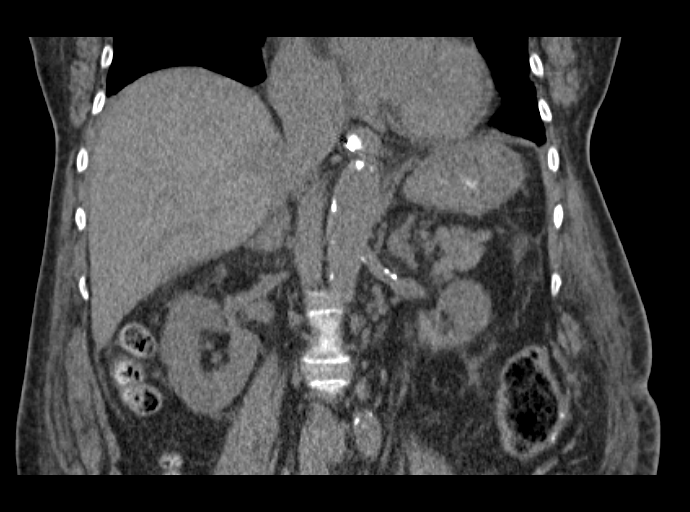

[16 of 46 positions shown; findings below may reference images not displayed]

FINDINGS: There is persistent dilatation of proximal small bowel
consistent with a partial small bowel obstruction.  The colon is
not dilated.  Biliary stent has been placed.  There is some reflux
of the oral contrast up the stent into the common bile duct.

There are focal areas of loculated hemorrhage along the inferior
margin of the right and left lobes of the liver, not significantly
changed since the prior study.  The hemorrhage and ascites around
the right lobe of the liver laterally and inferiorly have almost
resolved. No bile duct dilatation.

The bilateral pleural effusions have increased, right greater than
left, with increased compressive atelectasis of the lower lobes.

No other change since the prior exam.
IMPRESSION: 1.  Interval insertion of biliary stent.  Almost complete
resolution of the fluid adjacent to the right lobe of the liver.
2.  Stable perihepatic hemorrhage adjacent to the inferior aspect
of the left and right lobes of the liver.
3.  Increasing bilateral pleural effusions with increased
atelectasis at both lung bases.
4.  Persistent partial small bowel obstruction.

## 2014-04-13 IMAGING — CR DG CHEST 1V PORT
1 series · 1 of 1 positions shown · non-contrast
Comparison: 05/19/2012

CLINICAL DATA: Leukocytosis, hypoxia

PORTABLE CHEST - 1 VIEW

[AP]
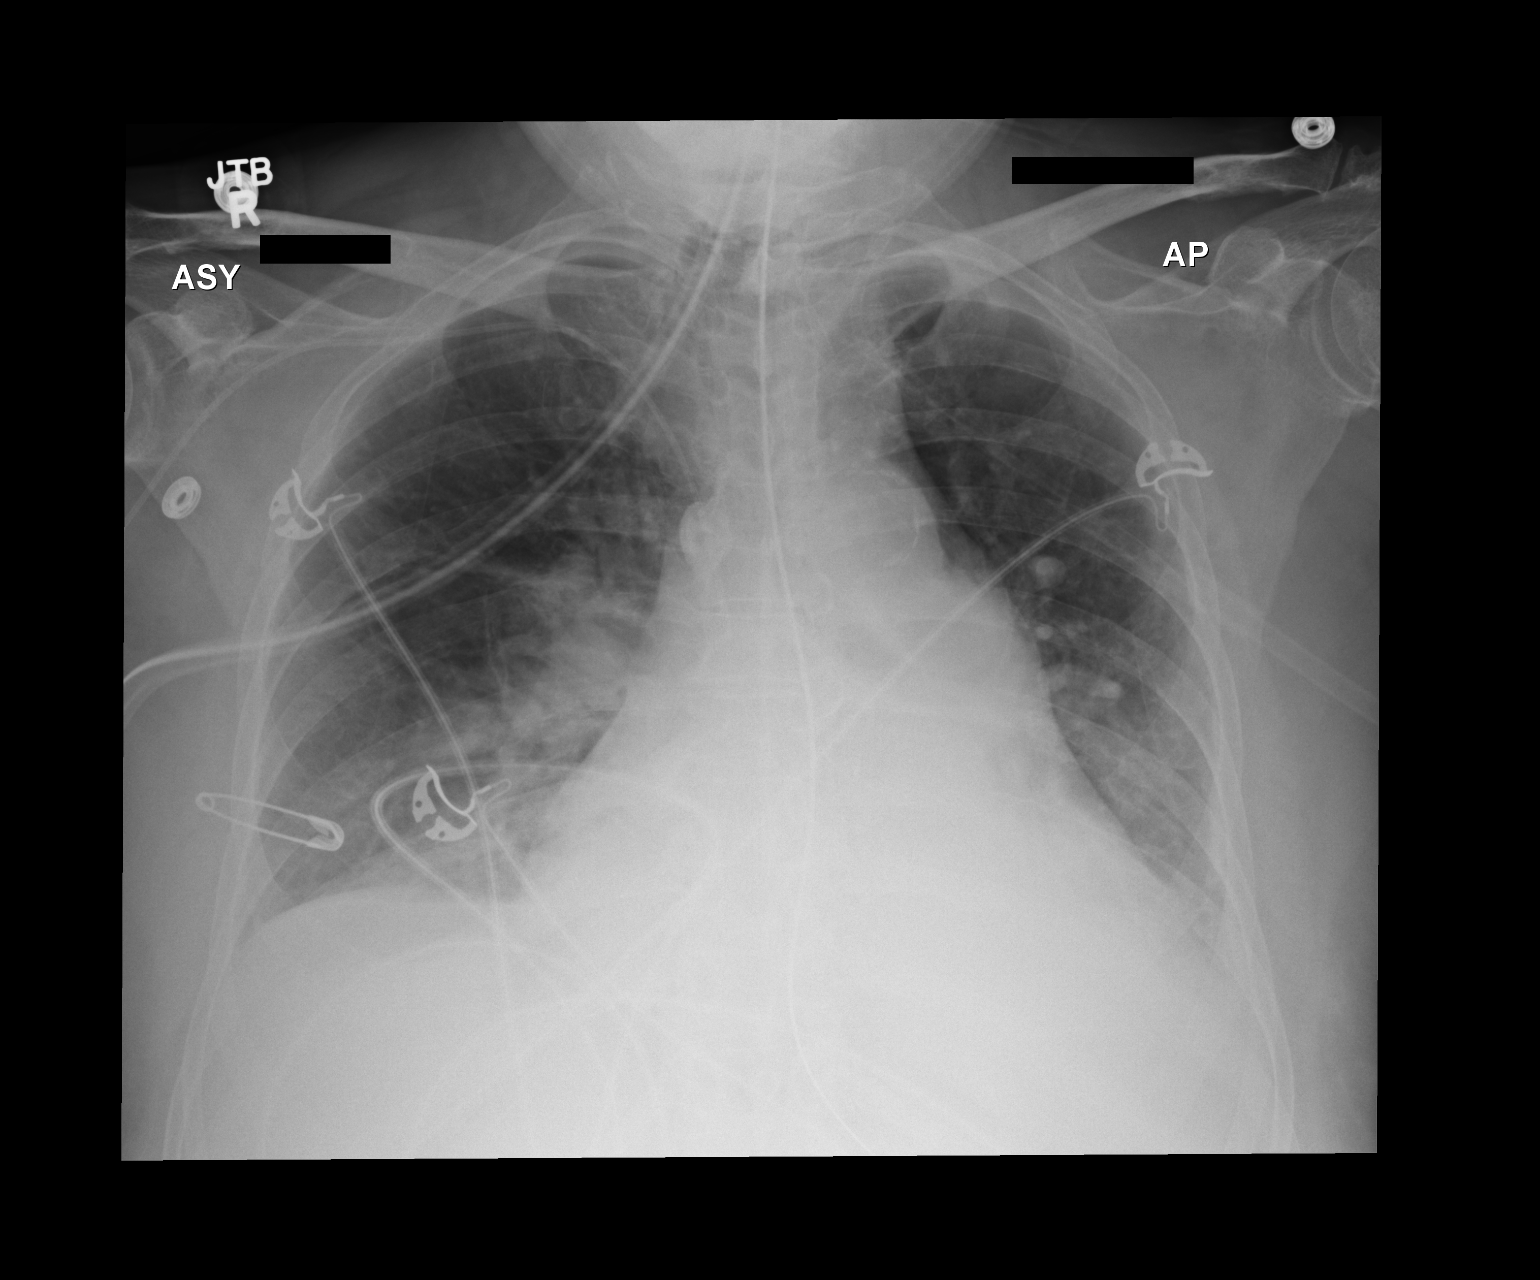

[1 of 1 positions shown; findings below may reference images not displayed]

FINDINGS: Cardiomegaly with pulmonary vascular congestion and
suspected mild central pulmonary edema in the right perihilar
region.  Bibasilar opacities, possibly atelectasis.  No
pneumothorax.

Stable enteric tube and right arm PICC.
IMPRESSION: Cardiomegaly with pulmonary vascular congestion and suspected mild
central pulmonary edema.

Bibasilar opacities, possibly atelectasis.

## 2014-04-13 IMAGING — CR DG CHEST 1V
1 series · 1 of 1 positions shown · non-contrast
Comparison: 8711 hours the same day and earlier.

CLINICAL DATA: 81-year-old female status post thoracentesis.

CHEST - 1 VIEW

[x chest ap]
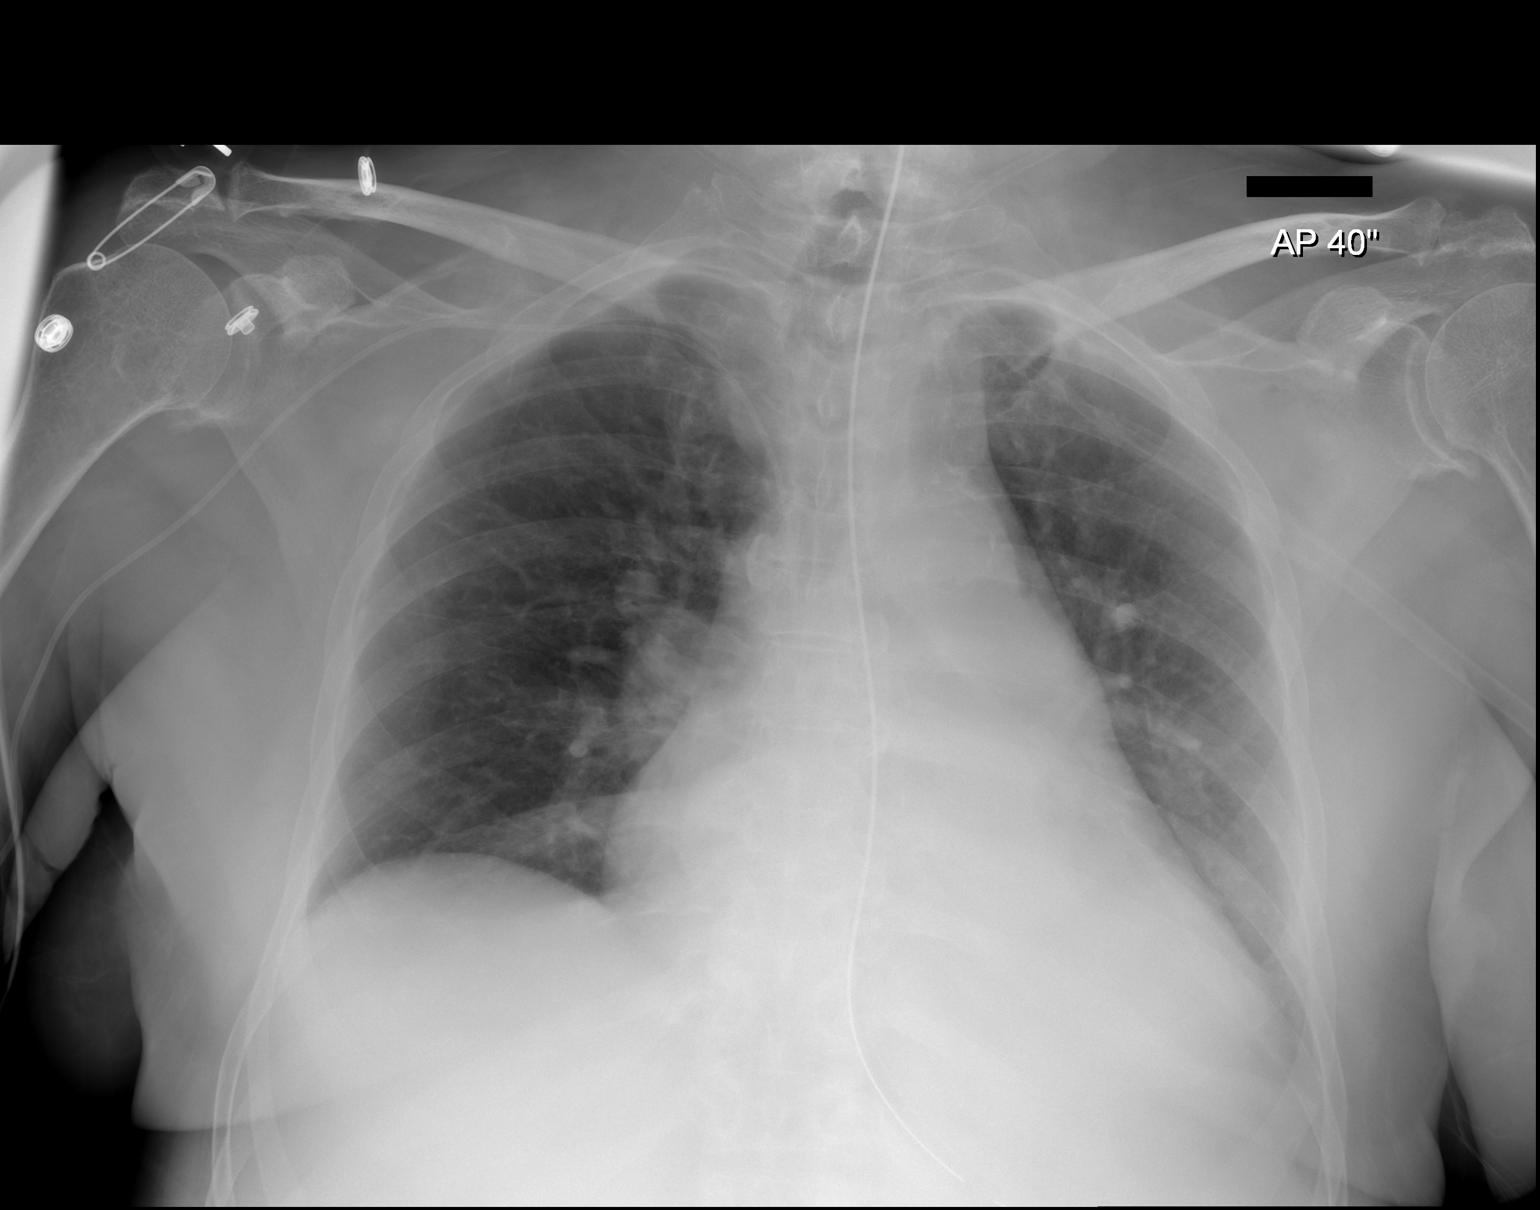

[1 of 1 positions shown; findings below may reference images not displayed]

FINDINGS: AP portable semi upright view 8930 hours.  No
pneumothorax.  Mildly improved ventilation at both lung bases.
Left pleural effusion stable or mildly decreased. Small/trace right
effusion.  Stable cardiac size and mediastinal contours.  Right
PICC line and enteric tube are stable.
IMPRESSION: No pneumothorax and mildly improved bibasilar ventilation following
thoracentesis.

## 2014-04-13 IMAGING — US US THORACENTESIS ASP PLEURAL SPACE W/IMG GUIDE
1 series · 3 of 3 positions shown · non-contrast
Comparison: None

CLINICAL DATA: Recent abdominal surgery, shortness of breath,
bilateral pleural effusions

ULTRASOUND GUIDED right THORACENTESIS

[Series 1: us thoracentesis asp pleural space w/img guide · 0.33mm/px · 3 of 3 slices shown]
[im 1/3]
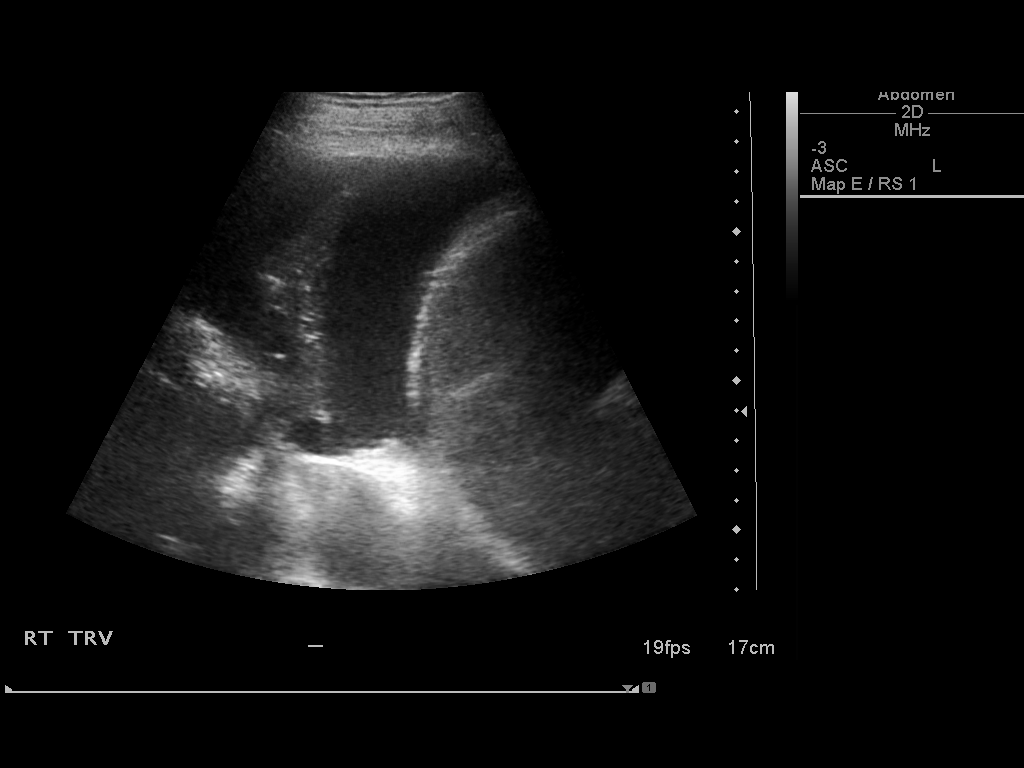
[im 2/3]
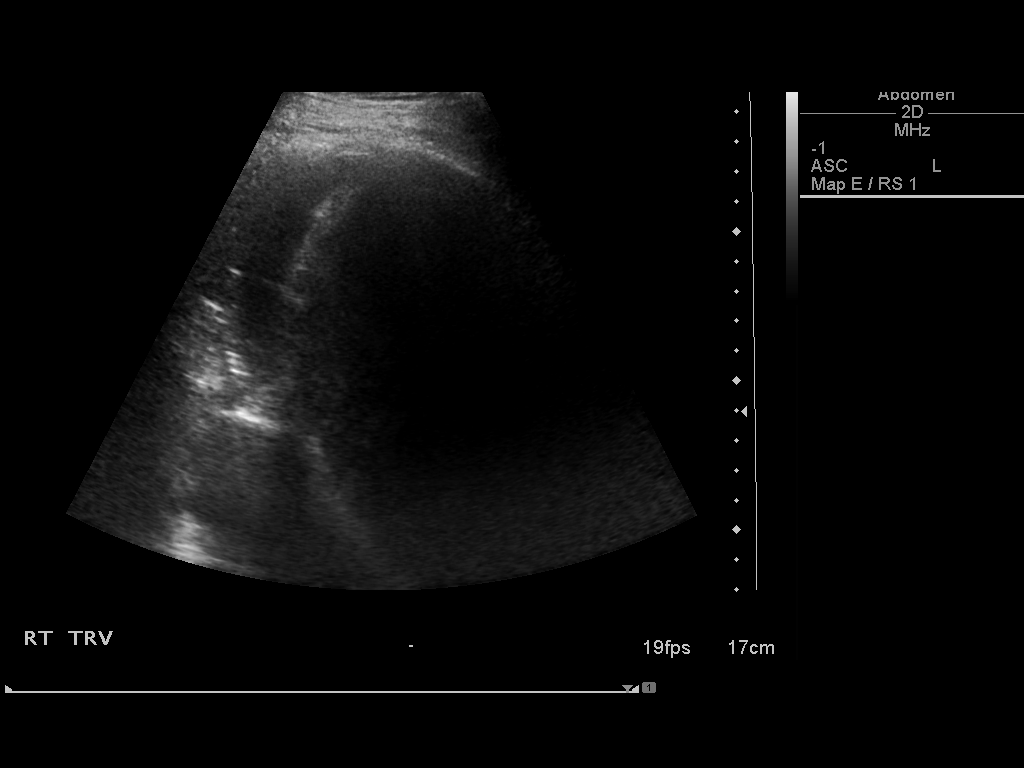
[im 3/3]
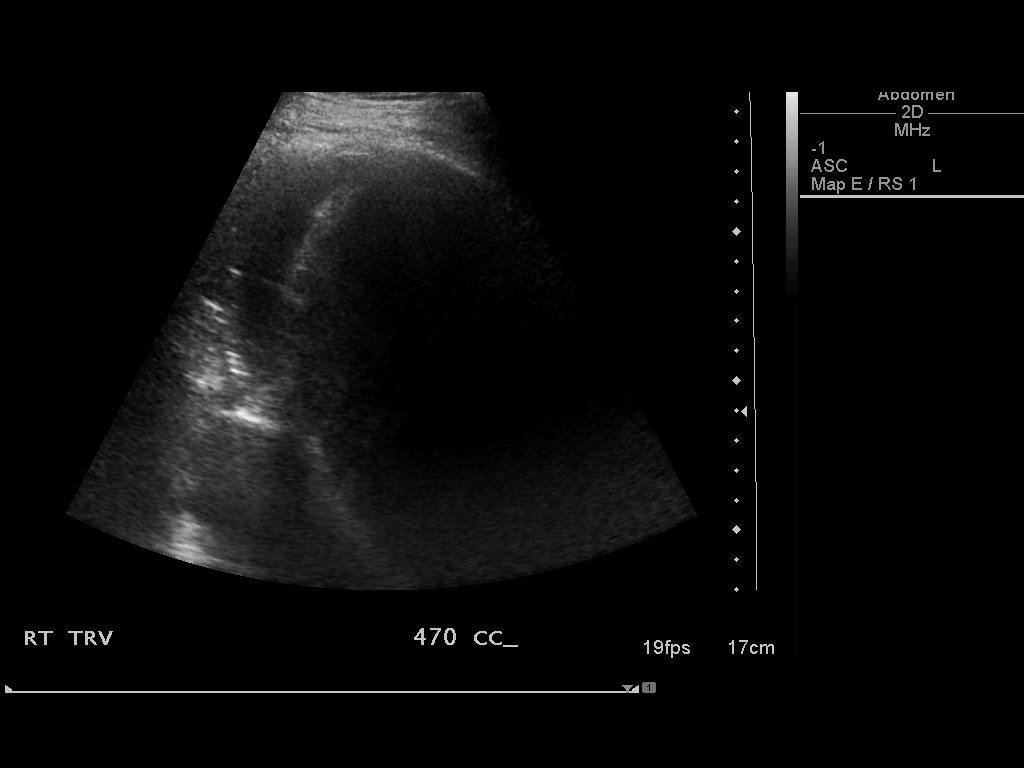

[3 of 3 positions shown; findings below may reference images not displayed]

An ultrasound guided thoracentesis was thoroughly discussed with
the patient and questions answered.  The benefits, risks,
alternatives and complications were also discussed.  The patient
understands and wishes to proceed with the procedure.  Written
consent was obtained.

Ultrasound was performed to each side of the chest, small amount of
fluid noted on left. Moderate amount of dense appearing fluid on
right. Right side chosen to localize and mark an adequate pocket of
fluid in the right chest.  The area was then prepped and draped in
the normal sterile fashion.  1% Lidocaine was used for local
anesthesia.  Under ultrasound guidance a 19 gauge Yueh catheter was
introduced.  Thoracentesis was performed.  The catheter was removed
and a dressing applied.

Complications:  None immediate
FINDINGS: A total of approximately 470 ml of dark bloody fluid was
removed. A fluid sample was sent for laboratory analysis.
IMPRESSION: Successful ultrasound guided right thoracentesis yielding 470 ml of
bloody pleural fluid.

Read by Manuel Anibal Darline

## 2014-04-14 ENCOUNTER — Ambulatory Visit: Payer: Medicare Other | Admitting: Pharmacist Clinician (PhC)/ Clinical Pharmacy Specialist

## 2014-04-14 ENCOUNTER — Other Ambulatory Visit: Payer: Self-pay | Admitting: Internal Medicine

## 2014-04-14 NOTE — Telephone Encounter (Signed)
Rx was sent to pharmacy electronically. 

## 2014-04-17 IMAGING — CR DG CHEST 1V PORT
1 series · 1 of 1 positions shown · non-contrast
Comparison: Chest x-ray 05/23/2012.

CLINICAL DATA: Right pleural effusion.

PORTABLE CHEST - 1 VIEW

[AP]
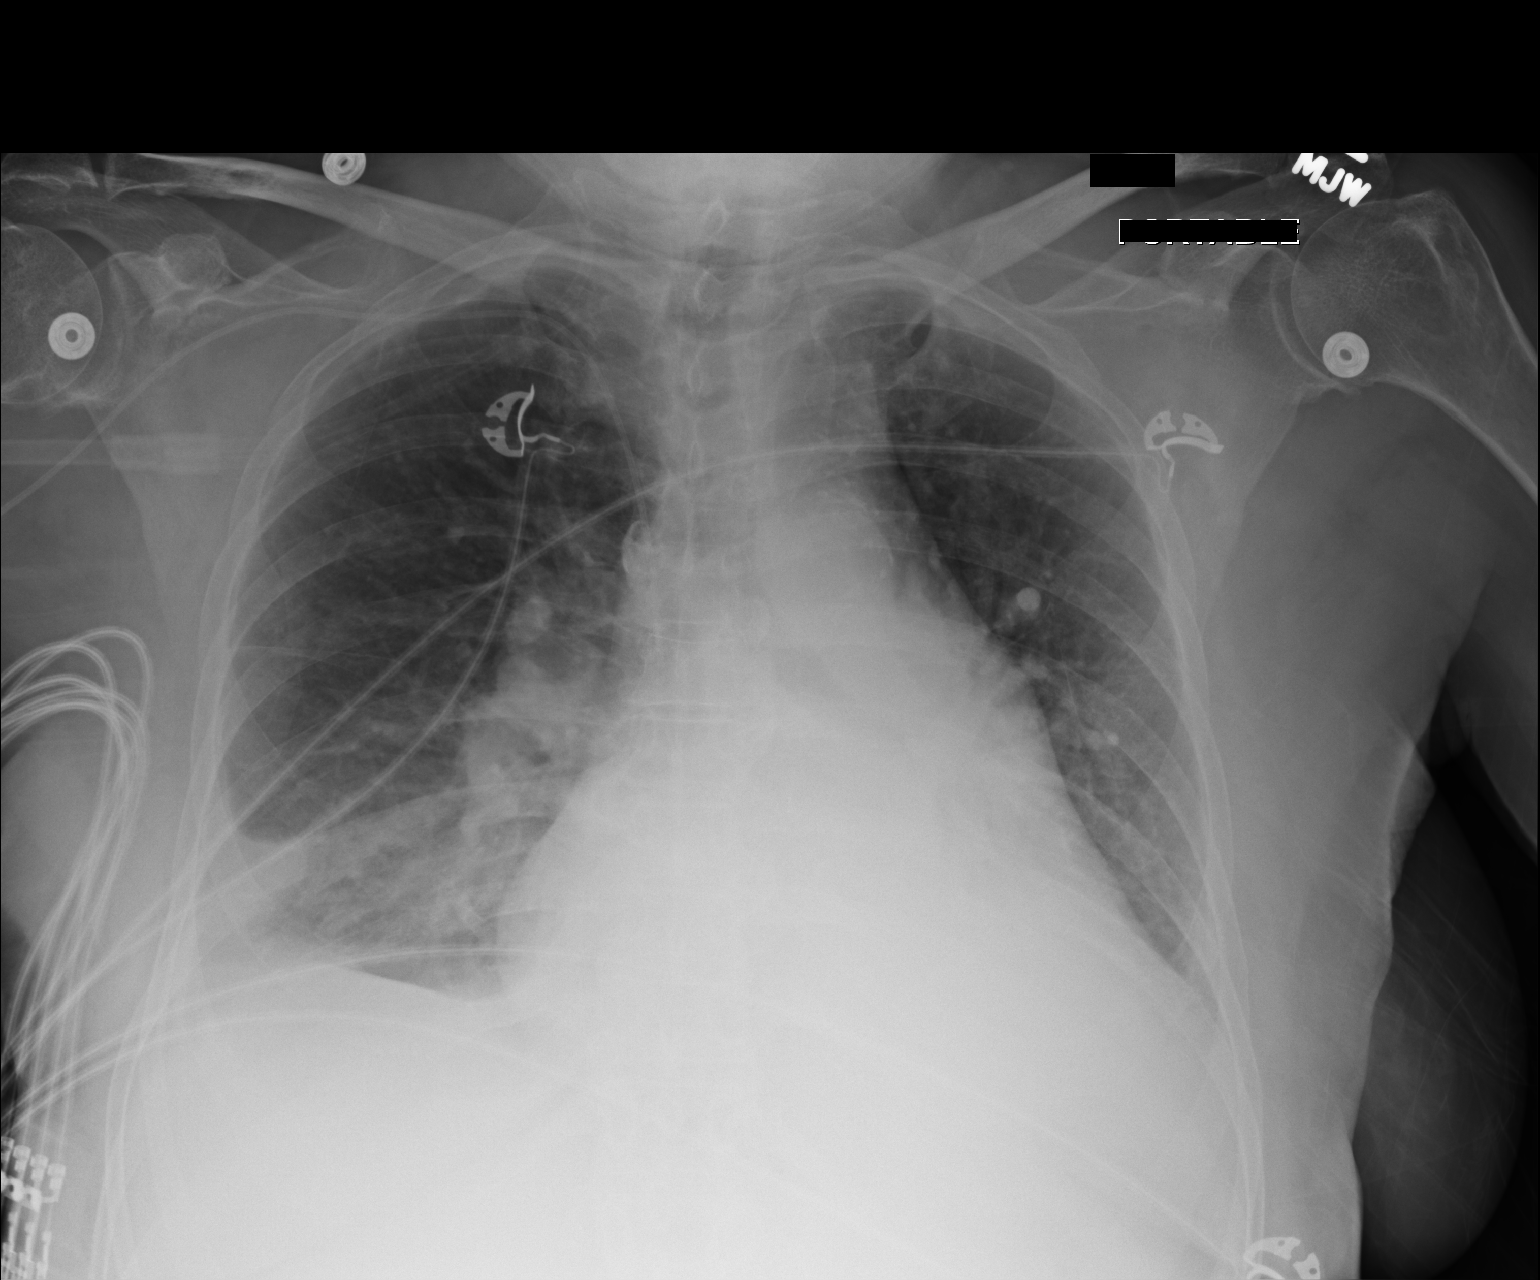

[1 of 1 positions shown; findings below may reference images not displayed]

FINDINGS: There is a right upper extremity PICC with tip
terminating in the mid superior vena cava.  Nasogastric tube has
been removed.  Lung volumes remain low.  There are worsening
bibasilar opacities that may reflect areas of atelectasis and/or
consolidation.  Small bilateral pleural effusions (right greater
than left), increasing on the right.  Pulmonary vascular crowding,
without frank pulmonary edema.  Heart size is mildly enlarged
(unchanged). The patient is rotated to the left on today's exam,
resulting in distortion of the mediastinal contours and reduced
diagnostic sensitivity and specificity for mediastinal pathology.
Atherosclerosis in the thoracic aorta.
IMPRESSION: 1.  Support apparatus, as above.
2.  Worsening bibasilar aeration compatible with increasing
bibasilar atelectasis and/or consolidation and increasing bilateral
pleural effusions (particularly on the right).
3.  Atherosclerosis.

## 2014-04-19 ENCOUNTER — Telehealth: Payer: Self-pay | Admitting: Internal Medicine

## 2014-04-19 NOTE — Telephone Encounter (Signed)
Left message for angela, last echo 2011 with EF% of 45-50%

## 2014-04-19 NOTE — Telephone Encounter (Signed)
She needs pt ejection fraction from her last echo please. Please fax it [phone].

## 2014-04-20 ENCOUNTER — Ambulatory Visit: Payer: Medicare Other | Admitting: Internal Medicine

## 2014-04-22 IMAGING — CR DG WRIST COMPLETE 3+V*R*
4 series · 4 of 4 positions shown · non-contrast
Comparison: None.

CLINICAL DATA: Right wrist pain for 5 days.  No injury.

RIGHT WRIST - COMPLETE 3+ VIEW

[x wrist pa right]
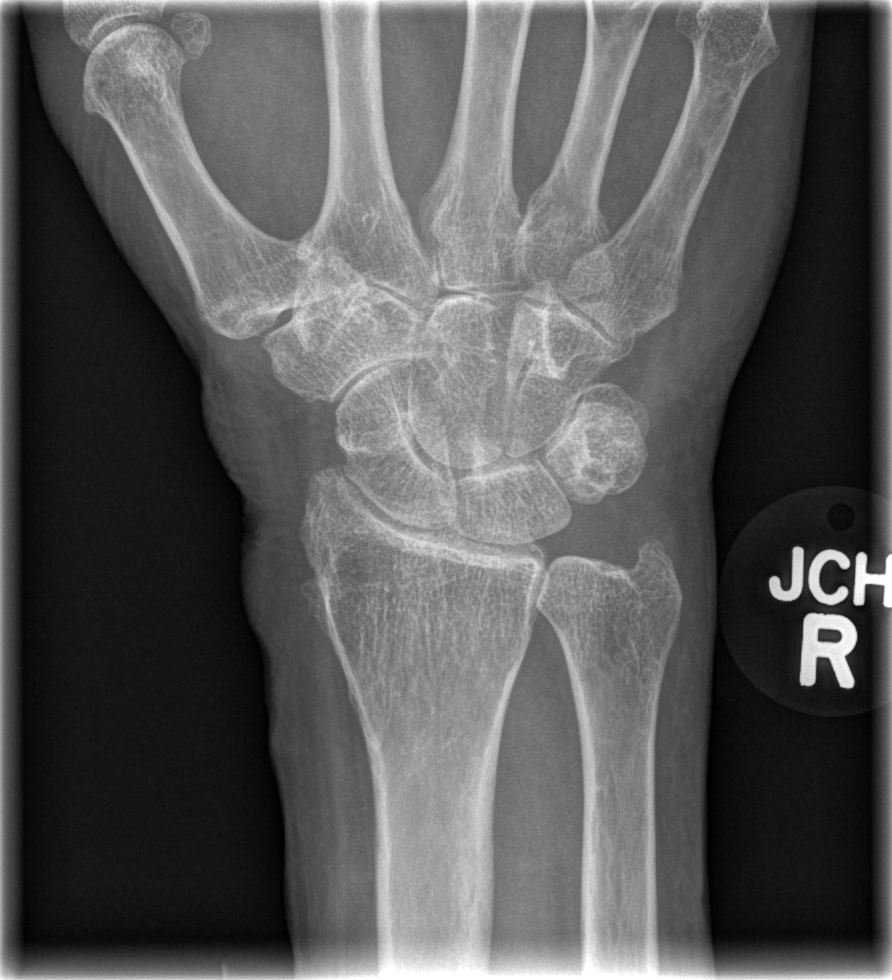

[x wrist obl right]
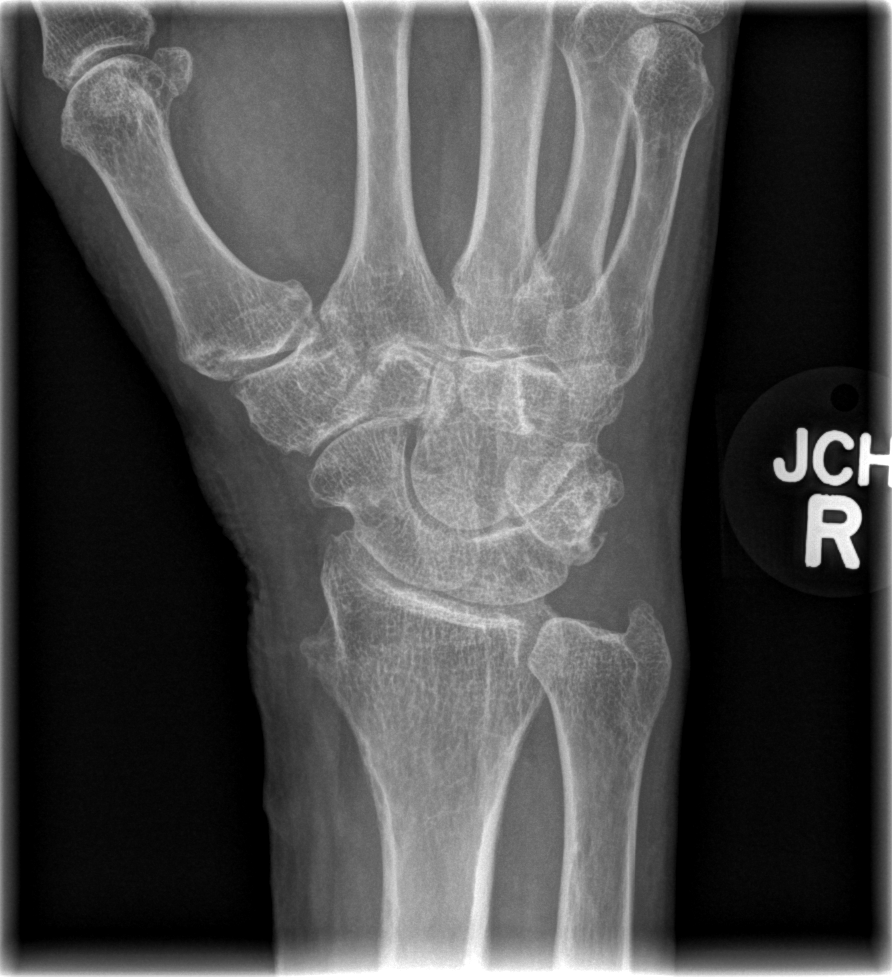

[x wrist lat right]
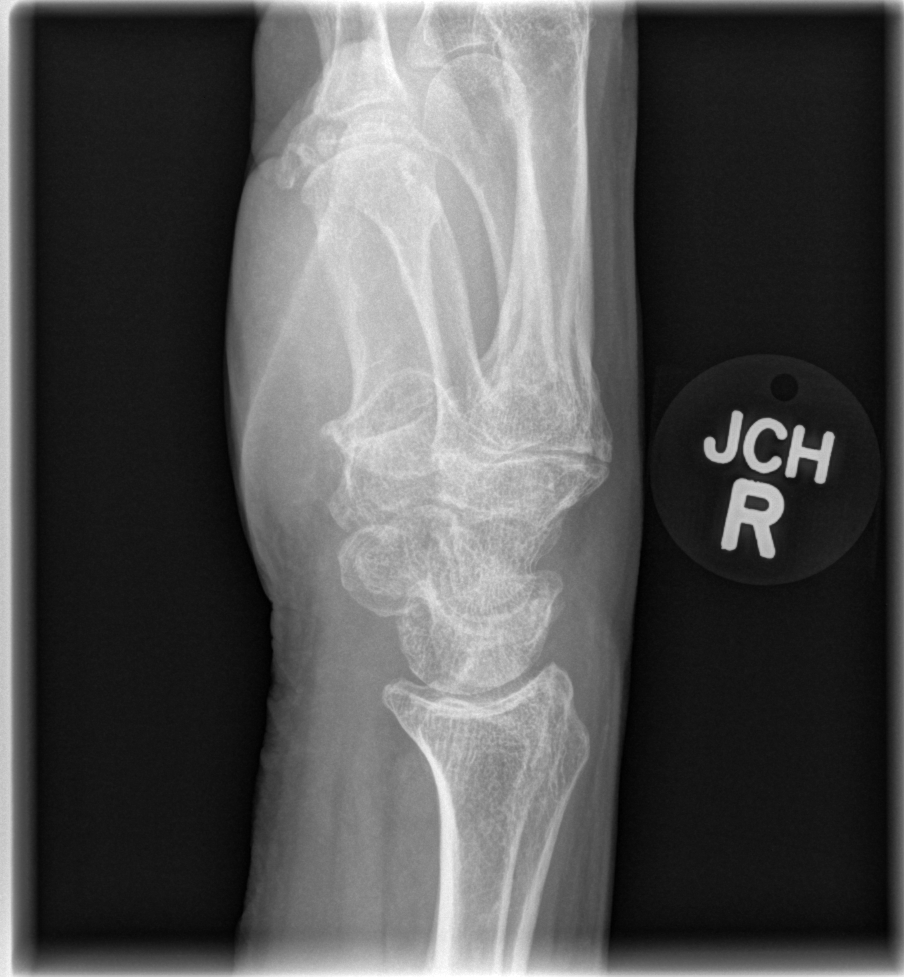

[x navicular]
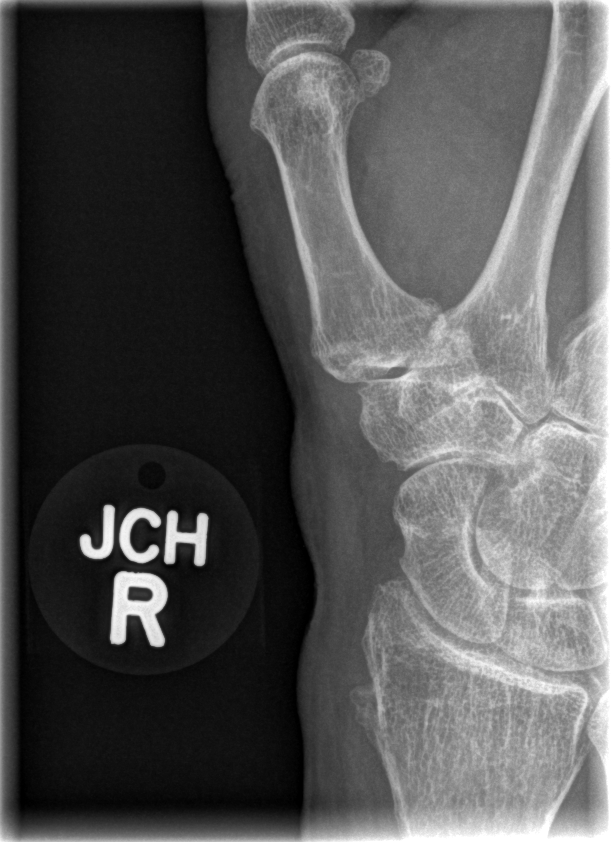

[4 of 4 positions shown; findings below may reference images not displayed]

FINDINGS: Exam demonstrates mild osteopenia.  There are
degenerative changes of the carpal bones and radiocarpal joint.
There is no acute fracture or dislocation.
IMPRESSION: No acute findings.

Degenerative changes.

## 2014-04-27 ENCOUNTER — Telehealth: Payer: Self-pay | Admitting: Internal Medicine

## 2014-04-27 ENCOUNTER — Other Ambulatory Visit: Payer: Self-pay | Admitting: *Deleted

## 2014-04-27 ENCOUNTER — Encounter: Payer: Self-pay | Admitting: *Deleted

## 2014-04-27 NOTE — Telephone Encounter (Signed)
Caitlyn called in stating she will be discharging the pt today and wanted to report her  INR levels. Please call  Thanks

## 2014-04-27 NOTE — Telephone Encounter (Signed)
HOME HEALTH NURSE CALLED TO INFORM OFFICE  PATIENT IS BEING DISCHARGE FROM SERVICE. SHE WILL NOT BE DOING PROTIME ANYMORE APPOINTMENT WILL BE NEED FOR NEXT WEEK FOR COUMADIN CLINIC- SENT TO SCHEDULER TO DO.  Marland Kitchen

## 2014-04-27 NOTE — Telephone Encounter (Signed)
Forms filled out for patient to received Diabetic Testing Supplies through [facility] given to Dr. Bubba Camp to sign

## 2014-04-28 ENCOUNTER — Ambulatory Visit: Payer: Medicare Other | Admitting: Internal Medicine

## 2014-05-05 ENCOUNTER — Ambulatory Visit (INDEPENDENT_AMBULATORY_CARE_PROVIDER_SITE_OTHER): Payer: Medicare Other | Admitting: Pharmacist Clinician (PhC)/ Clinical Pharmacy Specialist

## 2014-05-05 DIAGNOSIS — I4891 Unspecified atrial fibrillation: Secondary | ICD-10-CM

## 2014-05-05 DIAGNOSIS — Z7901 Long term (current) use of anticoagulants: Secondary | ICD-10-CM

## 2014-05-05 LAB — POCT INR: INR: 3.2

## 2014-05-26 ENCOUNTER — Encounter: Payer: Self-pay | Admitting: Internal Medicine

## 2014-05-26 ENCOUNTER — Ambulatory Visit (INDEPENDENT_AMBULATORY_CARE_PROVIDER_SITE_OTHER): Payer: Medicare Other | Admitting: Internal Medicine

## 2014-05-26 ENCOUNTER — Ambulatory Visit (INDEPENDENT_AMBULATORY_CARE_PROVIDER_SITE_OTHER): Payer: Medicare Other | Admitting: Pharmacist Clinician (PhC)/ Clinical Pharmacy Specialist

## 2014-05-26 VITALS — BP 124/82 | HR 74 | Ht 69.0 in | Wt 172.2 lb

## 2014-05-26 DIAGNOSIS — I5022 Chronic systolic (congestive) heart failure: Secondary | ICD-10-CM

## 2014-05-26 DIAGNOSIS — I4891 Unspecified atrial fibrillation: Secondary | ICD-10-CM

## 2014-05-26 DIAGNOSIS — I712 Thoracic aortic aneurysm, without rupture, unspecified: Secondary | ICD-10-CM

## 2014-05-26 DIAGNOSIS — Z7901 Long term (current) use of anticoagulants: Secondary | ICD-10-CM

## 2014-05-26 DIAGNOSIS — R040 Epistaxis: Secondary | ICD-10-CM

## 2014-05-26 DIAGNOSIS — I10 Essential (primary) hypertension: Secondary | ICD-10-CM

## 2014-05-26 LAB — POCT INR: INR: 2.4

## 2014-05-26 NOTE — Progress Notes (Signed)
OFFICE NOTE  Chief Complaint:  Follow up visit  Primary Care Physician: Hollace Kinnier, DO  HPI:  Nicole [name] is a 80 year old female with history of chronic atrial fibrillation and controlled ventricular response. She was previously followed by Dr. Rex Kras, has a history of congestive heart failure with an EF of about 45% and moderate MR in the past. She has a thoracic aneurysm which has been asymptomatic, as well as hypertension, diabetes, dyslipidemia, and recently was admitted for gallbladder surgery which was complicated by bleeding issues. She has been deemed not a good candidate for Coumadin due to a number of issues with compliance by Dr. Rex Kras, and has been on aspirin and Plavix. Unfortunately, with regard to the Plavix she had complicated bleeding postoperatively from her gallbladder surgery and Plavix was discontinued due to this. It turns out that there is little benefit to it additional Plavix on top of aspirin for stroke reduction. As far as her stroke risk is concerned, it is actually fairly high considering she is in chronic atrial fibrillation and does have a history of hypertension and diabetes and is a female with a CHADS2 score of 3, a CHADS VASc score of 3-1/2 or 4.  Based on this, I recommended starting her on Eliquis.   Nicole [name] returns today for followup. Unfortunately she was just in the hospital with epistaxis. This is following a period of time where she developed small bowel obstruction and had a nasogastric tube in place. This could contribute to possible erosions. She was seen by an ear nose and throat specialist to placed a nasal balloon and ultimately packed her nose. This packing was in for a week and was removed and no further bleeding was noted. It was not mentioned what the cause of her bleeding was. She was on adequate. She still has a high CHADSVASC score. We had recommended discontinuing her eloquence due to bleeding. She was also in A. fib with RVR and I  recommended increasing her diltiazem to 240 mg daily but that was not performed. She's had no further epistaxis.  Nicole [name] returns today for follow-up. Her INR is therapeutic at 2.4. She denies any recurrent epistaxis. She seems to be doing well on increased dose diltiazem. She remains in atrial fibrillation with heart rate of 74.  PMHx:   Past Medical History  Diagnosis Date  . Colon cancer 07/1997  . Hypertension   . Diabetes mellitus   . CHF (congestive heart failure)   . AAA (abdominal aortic aneurysm)   . Atrial fibrillation   . Hypercholesteremia   . Kidney stone     renal calculi  . Small bowel obstruction due to adhesions 05/16/2012  . Hypothyroidism   . Arthritis   . Anemia   . Cholelithiasis   . Perforation of bile duct   . Hemorrhage of rectum and anus   . Swelling, mass, or lump in head and neck   . Cervicalgia   . Hypoglycemia, unspecified   . Pain in joint, lower leg   . Anxiety   . Shortness of breath   . Acute upper respiratory infections of unspecified site   . Depression   . Thoracic aneurysm without mention of rupture   . GERD (gastroesophageal reflux disease)   . Proteinuria   . Other specified cardiac dysrhythmias(427.89)   . Congestive heart failure, unspecified   . Electrolyte and fluid disorders not elsewhere classified   . Other chronic allergic conjunctivitis   . Hypopotassemia   .  Chronic systolic heart failure   . Dizziness and giddiness   . Malignant neoplasm of colon, unspecified site   . Other and unspecified hyperlipidemia   . Gout, unspecified   . Atrial fibrillation   . Aortic aneurysm of unspecified site without mention of rupture   . Pain in joint, site unspecified   . Palpitations   . CKD (chronic kidney disease)   . Diabetes mellitus     Past Surgical History  Procedure Laterality Date  . Hemicolectomy  08/11/1997  . Kidney stone surgery    . Colon surgery      to remove colon ca  . Cholecystectomy  05/14/2012     Procedure: LAPAROSCOPIC CHOLECYSTECTOMY WITH INTRAOPERATIVE CHOLANGIOGRAM;  Surgeon: Haywood Lasso, MD;  Location: [place];  Service: General;  Laterality: N/A;  . Ercp  05/17/2012    Procedure: ENDOSCOPIC RETROGRADE CHOLANGIOPANCREATOGRAPHY (ERCP);  Surgeon: Missy Sabins, MD;  Location: [place];  Service: Gastroenterology;  Laterality: N/A;  . Ercp  08/12/2012    Procedure: ENDOSCOPIC RETROGRADE CHOLANGIOPANCREATOGRAPHY (ERCP);  Surgeon: Missy Sabins, MD;  Location: [facility] ENDOSCOPY;  Service: Endoscopy;  Laterality: N/A;  . Transthoracic echocardiogram  11/2009    EF 45-50%, mild-mod AV regurg; mild MR; mod TR; ascending aorta mildly dilated  . Cardiac catheterization  2008    moderate severe pulm HTN; with elevted pulm capillary wedge pressure     FAMHx:  Family History  Problem Relation Age of Onset  . Heart disease Mother   . Stroke Father   . Hypertension Daughter   . Hypertension Son   . Diabetes Sister   . Hypertension Son     SOCHx:   reports that she has never smoked. She has never used smokeless tobacco. She reports that she does not drink alcohol or use illicit drugs.  ALLERGIES:  Allergies  Allergen Reactions  . Iohexol      Code: VOM, Desc: PT REPORTS VOMITING W/ IVP DYE- ARS 12/26/08, Onset Date: 89211941   . Z-Pak [Azithromycin]   . Iodinated Diagnostic Agents Nausea Only    PT HAS HAD SINGULAR INCIDENT OF NAUSEA  W/ IV CONTYRAST INJECTION, NONE IF INJECTED SLOWLY//A.[place]    ROS: A comprehensive review of systems was negative.  HOME MEDS: Current Outpatient Prescriptions  Medication Sig Dispense Refill  . diltiazem (CARTIA XT) 240 MG 24 hr capsule Take 1 capsule (240 mg total) by mouth daily. 30 capsule 9  . furosemide (LASIX) 40 MG tablet Take 40 mg by mouth 2 (two) times daily.    . furosemide (LASIX) 40 MG tablet take 1 tablet by mouth twice a day 60 tablet 11  . glipiZIDE (GLUCOTROL) 10 MG tablet Take 10 mg by mouth daily before breakfast.    .  indomethacin (INDOCIN) 25 MG capsule Take 1 capsule (25 mg total) by mouth 3 (three) times daily as needed. 30 capsule 0  . levothyroxine (SYNTHROID, LEVOTHROID) 50 MCG tablet Take 1 tablet (50 mcg total) by mouth daily. 90 tablet 3  . magnesium oxide (MAG-OX) 400 (241.3 MG) MG tablet Take 400 mg by mouth 2 (two) times daily.    . metFORMIN (GLUCOPHAGE) 500 MG tablet Take 1 tablet (500 mg total) by mouth 2 (two) times daily with a meal. 60 tablet 5  . metoprolol (LOPRESSOR) 25 MG tablet Take 1 tablet (25 mg total) by mouth 2 (two) times daily. 60 tablet 3  . polyethylene glycol (MIRALAX / GLYCOLAX) packet Take 17 g by mouth daily as needed  for mild constipation.     . potassium chloride SA (K-DUR,KLOR-CON) 20 MEQ tablet Take 20 mEq by mouth daily.    . simvastatin (ZOCOR) 40 MG tablet Take 40 mg by mouth daily.    Marland Kitchen warfarin (COUMADIN) 5 MG tablet Take 2.5-5 mg by mouth See admin instructions. Take 1 tablet Monday ,Wednesday and Friday.  Take 0.5 tablet on Tuesday, Thursday, Saturday and Sunday.    Marland Kitchen allopurinol (ZYLOPRIM) 100 MG tablet as needed.  0   No current facility-administered medications for this visit.    LABS/IMAGING: Results for orders placed or performed in visit on 05/26/14 (from the past 48 hour(s))  POCT INR     Status: None   Collection Time: 05/26/14  3:24 PM  Result Value Ref Range   INR 2.4    No results found.  VITALS: BP 124/82 mmHg  Pulse 74  Ht 5\' 9"  (1.753 m)  Wt 172 lb 3.2 oz (78.109 kg)  BMI 25.42 kg/m2  EXAM: General appearance: alert and no distress Neck: no adenopathy, no carotid bruit, no JVD, supple, symmetrical, trachea midline and thyroid not enlarged, symmetric, no tenderness/mass/nodules Lungs: clear to auscultation bilaterally Heart: regular rate and rhythm, S1, S2 normal, no murmur, click, rub or gallop Abdomen: soft, non-tender; bowel sounds normal; no masses,  no organomegaly Extremities: extremities normal, atraumatic, no cyanosis or  edema Pulses: 2+ and symmetric Skin: Skin color, texture, turgor normal. No rashes or lesions Neurologic: Grossly normal  EKG: A. Fib at 74, incomplete right bundle branch block  ASSESSMENT: 1. Chronic atrial fibrillation - Eliquis on hold for epistaxis, switched to warfarin 2. History of congestive heart failure, EF of 45% 3. Hypertension 4. History of thoracic aneurysm 5. Diabetes 6. Dyslipidemia 7. Chronic kidney disease  PLAN: 1.   Ms. Rosekrans appears to have better rate control on her current dose of diltiazem. Blood pressure is well controlled at 124/82. She's not had any further epistaxis on warfarin. INR today is therapeutic at 2.4. We will plan to continue her current medications and I'll see her back in 6 months.  Pixie Casino, MD, [facility] Attending Cardiologist [facility] 05/26/2014, 5:24 PM

## 2014-05-26 NOTE — Patient Instructions (Signed)
Your physician wants you to follow-up in: 6 months with Dr. Hilty. You will receive a reminder letter in the mail two months in advance. If you don't receive a letter, please call our office to schedule the follow-up appointment.    

## 2014-06-03 ENCOUNTER — Other Ambulatory Visit: Payer: Self-pay | Admitting: *Deleted

## 2014-06-03 MED ORDER — GLIPIZIDE 10 MG PO TABS: ORAL_TABLET | ORAL | Status: DC

## 2014-06-03 NOTE — Telephone Encounter (Signed)
[facility] 

## 2014-06-07 ENCOUNTER — Other Ambulatory Visit: Payer: Medicare Other

## 2014-06-07 DIAGNOSIS — E034 Atrophy of thyroid (acquired): Secondary | ICD-10-CM

## 2014-06-07 DIAGNOSIS — E1149 Type 2 diabetes mellitus with other diabetic neurological complication: Secondary | ICD-10-CM

## 2014-06-07 DIAGNOSIS — K565 Intestinal adhesions [bands], unspecified as to partial versus complete obstruction: Secondary | ICD-10-CM

## 2014-06-07 DIAGNOSIS — I4891 Unspecified atrial fibrillation: Secondary | ICD-10-CM

## 2014-06-07 DIAGNOSIS — E1121 Type 2 diabetes mellitus with diabetic nephropathy: Secondary | ICD-10-CM

## 2014-06-08 LAB — BASIC METABOLIC PANEL
BUN/Creatinine Ratio: 9 — ABNORMAL LOW (ref 11–26)
BUN: 10 mg/dL (ref 8–27)
CO2: 24 mmol/L (ref 18–29)
Calcium: 9.2 mg/dL (ref 8.7–10.3)
Chloride: 101 mmol/L (ref 97–108)
Creatinine, Ser: 1.13 mg/dL — ABNORMAL HIGH (ref 0.57–1.00)
GFR calc Af Amer: 53 mL/min/{1.73_m2} — ABNORMAL LOW (ref >59)
GFR calc non Af Amer: 46 mL/min/{1.73_m2} — ABNORMAL LOW (ref >59)
Glucose: 123 mg/dL — ABNORMAL HIGH (ref 65–99)
Potassium: 3.5 mmol/L (ref 3.5–5.2)
Sodium: 143 mmol/L (ref 134–144)

## 2014-06-08 LAB — TSH: TSH: 3.5 u[IU]/mL (ref 0.450–4.500)

## 2014-06-08 LAB — LIPID PANEL
Chol/HDL Ratio: 2.7 ratio units (ref 0.0–4.4)
Cholesterol, Total: 169 mg/dL (ref 100–199)
HDL: 62 mg/dL (ref >39)
LDL Calculated: 88 mg/dL (ref 0–99)
Triglycerides: 95 mg/dL (ref 0–149)
VLDL Cholesterol Cal: 19 mg/dL (ref 5–40)

## 2014-06-08 LAB — CBC WITH DIFFERENTIAL
Basophils Absolute: 0 10*3/uL (ref 0.0–0.2)
Basos: 0 %
Eos: 1 %
Eosinophils Absolute: 0.1 10*3/uL (ref 0.0–0.4)
HCT: 36.6 % (ref 34.0–46.6)
Hemoglobin: 11.9 g/dL (ref 11.1–15.9)
Immature Grans (Abs): 0 10*3/uL (ref 0.0–0.1)
Immature Granulocytes: 0 %
Lymphocytes Absolute: 2.2 10*3/uL (ref 0.7–3.1)
Lymphs: 31 %
MCH: 25.5 pg — ABNORMAL LOW (ref 26.6–33.0)
MCHC: 32.5 g/dL (ref 31.5–35.7)
MCV: 79 fL (ref 79–97)
Monocytes Absolute: 0.8 10*3/uL (ref 0.1–0.9)
Monocytes: 11 %
Neutrophils Absolute: 4.1 10*3/uL (ref 1.4–7.0)
Neutrophils Relative %: 57 %
Platelets: 325 10*3/uL (ref 150–379)
RBC: 4.66 x10E6/uL (ref 3.77–5.28)
RDW: 17.9 % — ABNORMAL HIGH (ref 12.3–15.4)
WBC: 7.2 10*3/uL (ref 3.4–10.8)

## 2014-06-08 LAB — HEMOGLOBIN A1C
Est. average glucose Bld gHb Est-mCnc: 126 mg/dL
Hgb A1c MFr Bld: 6 % — ABNORMAL HIGH (ref 4.8–5.6)

## 2014-06-10 ENCOUNTER — Ambulatory Visit (INDEPENDENT_AMBULATORY_CARE_PROVIDER_SITE_OTHER): Payer: Medicare Other | Admitting: Internal Medicine

## 2014-06-10 ENCOUNTER — Encounter: Payer: Self-pay | Admitting: Internal Medicine

## 2014-06-10 VITALS — BP 140/82 | HR 78 | Temp 98.4°F | Resp 18 | Ht 69.0 in | Wt 177.2 lb

## 2014-06-10 DIAGNOSIS — R200 Anesthesia of skin: Secondary | ICD-10-CM

## 2014-06-10 DIAGNOSIS — E034 Atrophy of thyroid (acquired): Secondary | ICD-10-CM

## 2014-06-10 DIAGNOSIS — K565 Intestinal adhesions [bands], unspecified as to partial versus complete obstruction: Secondary | ICD-10-CM

## 2014-06-10 DIAGNOSIS — R202 Paresthesia of skin: Secondary | ICD-10-CM

## 2014-06-10 DIAGNOSIS — I4891 Unspecified atrial fibrillation: Secondary | ICD-10-CM

## 2014-06-10 DIAGNOSIS — K5900 Constipation, unspecified: Secondary | ICD-10-CM

## 2014-06-10 DIAGNOSIS — E038 Other specified hypothyroidism: Secondary | ICD-10-CM

## 2014-06-10 DIAGNOSIS — K5909 Other constipation: Secondary | ICD-10-CM

## 2014-06-10 DIAGNOSIS — E1121 Type 2 diabetes mellitus with diabetic nephropathy: Secondary | ICD-10-CM

## 2014-06-10 NOTE — Progress Notes (Signed)
Patient ID: Nicole [name], female   DOB: [birth year], 80 y.o.   MRN: [id number]   Location:  [facility]  Code Status: DNR  Allergies  Allergen Reactions  . Iohexol      Code: VOM, Desc: PT REPORTS VOMITING W/ IVP DYE- ARS 12/26/08, Onset Date: [id number]   . Z-Pak [Azithromycin]   . Iodinated Diagnostic Agents Nausea Only    PT HAS HAD SINGULAR INCIDENT OF NAUSEA  W/ IV CONTYRAST INJECTION, NONE IF INJECTED SLOWLY//A.[facility]    Chief Complaint  Patient presents with  . Medical Management of Chronic Issues    HPI: Patient is a 80 y.o. black female seen in the office today for med mgt chronic diseases.  Was meant to be seen for hospital f/u but appt was day she left the hospital so she missed it.    Was in and out of hospital Is tired of going to doctor Changed from Dr. Rex Kras to Dr. Debara Pickett and eliquis got changed to coumadin b/c of bleeding Has to see NP for INR checks weekly now--last INR was 2.4 on 11/4 for her afib.    Had another hospitalization with partial small bowel obstruction in September.  No surgery--managed medically.   Still having problems having bms--taking miralax "and all that mess".  Will have a week where she is good, then acts up, then good again.    Appetite is not what it used to be.  Doesn't want to eat that much.  Eats smaller amts--half sandwich instead of whole.   No problems with low sugars.  Has done well in terms of how she feels.  Review of Systems:  Review of Systems  Constitutional: Negative for fever, chills and malaise/fatigue.  HENT: Negative for congestion.   Eyes: Negative for blurred vision.  Respiratory: Negative for cough and shortness of breath.   Cardiovascular: Positive for leg swelling. Negative for chest pain.  Gastrointestinal: Positive for constipation. Negative for heartburn, nausea, vomiting, abdominal pain, diarrhea, blood in stool and melena.  Genitourinary: Negative for dysuria.    Musculoskeletal: Negative for myalgias, joint pain and falls.  Skin: Negative for rash.  Neurological: Negative for dizziness, loss of consciousness and weakness.  Endo/Heme/Allergies: Bruises/bleeds easily.  Psychiatric/Behavioral: Negative for depression and memory loss.    Past Medical History  Diagnosis Date  . Colon cancer 07/1997  . Hypertension   . Diabetes mellitus   . CHF (congestive heart failure)   . AAA (abdominal aortic aneurysm)   . Atrial fibrillation   . Hypercholesteremia   . Kidney stone     renal calculi  . Small bowel obstruction due to adhesions 05/16/2012  . Hypothyroidism   . Arthritis   . Anemia   . Cholelithiasis   . Perforation of bile duct   . Hemorrhage of rectum and anus   . Swelling, mass, or lump in head and neck   . Cervicalgia   . Hypoglycemia, unspecified   . Pain in joint, lower leg   . Anxiety   . Shortness of breath   . Acute upper respiratory infections of unspecified site   . Depression   . Thoracic aneurysm without mention of rupture   . GERD (gastroesophageal reflux disease)   . Proteinuria   . Other specified cardiac dysrhythmias(427.89)   . Congestive heart failure, unspecified   . Electrolyte and fluid disorders not elsewhere classified   . Other chronic allergic conjunctivitis   . Hypopotassemia   . Chronic systolic  heart failure   . Dizziness and giddiness   . Malignant neoplasm of colon, unspecified site   . Other and unspecified hyperlipidemia   . Gout, unspecified   . Atrial fibrillation   . Aortic aneurysm of unspecified site without mention of rupture   . Pain in joint, site unspecified   . Palpitations   . CKD (chronic kidney disease)   . Diabetes mellitus     Past Surgical History  Procedure Laterality Date  . Hemicolectomy  08/11/1997  . Kidney stone surgery    . Colon surgery      to remove colon ca  . Cholecystectomy  05/14/2012    Procedure: LAPAROSCOPIC CHOLECYSTECTOMY WITH INTRAOPERATIVE  CHOLANGIOGRAM;  Surgeon: Haywood Lasso, MD;  Location: [place];  Service: General;  Laterality: N/A;  . Ercp  05/17/2012    Procedure: ENDOSCOPIC RETROGRADE CHOLANGIOPANCREATOGRAPHY (ERCP);  Surgeon: Missy Sabins, MD;  Location: [place];  Service: Gastroenterology;  Laterality: N/A;  . Ercp  08/12/2012    Procedure: ENDOSCOPIC RETROGRADE CHOLANGIOPANCREATOGRAPHY (ERCP);  Surgeon: Missy Sabins, MD;  Location: [facility] ENDOSCOPY;  Service: Endoscopy;  Laterality: N/A;  . Transthoracic echocardiogram  11/2009    EF 45-50%, mild-mod AV regurg; mild MR; mod TR; ascending aorta mildly dilated  . Cardiac catheterization  2008    moderate severe pulm HTN; with elevted pulm capillary wedge pressure     Social History:   reports that she has never smoked. She has never used smokeless tobacco. She reports that she does not drink alcohol or use illicit drugs.  Family History  Problem Relation Age of Onset  . Heart disease Mother   . Stroke Father   . Hypertension Daughter   . Hypertension Son   . Diabetes Sister   . Hypertension Son     Medications: Patient's Medications  New Prescriptions   No medications on file  Previous Medications   ALLOPURINOL (ZYLOPRIM) 100 MG TABLET    as needed.   DILTIAZEM (CARTIA XT) 240 MG 24 HR CAPSULE    Take 1 capsule (240 mg total) by mouth daily.   FUROSEMIDE (LASIX) 40 MG TABLET    take 1 tablet by mouth twice a day   GLIPIZIDE (GLUCOTROL) 10 MG TABLET    Take one tablet by mouth once daily to control blood sugar   INDOMETHACIN (INDOCIN) 25 MG CAPSULE    Take 1 capsule (25 mg total) by mouth 3 (three) times daily as needed.   LEVOTHYROXINE (SYNTHROID, LEVOTHROID) 50 MCG TABLET    Take 1 tablet (50 mcg total) by mouth daily.   MAGNESIUM OXIDE (MAG-OX) 400 (241.3 MG) MG TABLET    Take 400 mg by mouth 2 (two) times daily.   METFORMIN (GLUCOPHAGE) 500 MG TABLET    Take 1 tablet (500 mg total) by mouth 2 (two) times daily with a meal.   METOPROLOL (LOPRESSOR) 25 MG  TABLET    Take 1 tablet (25 mg total) by mouth 2 (two) times daily.   POLYETHYLENE GLYCOL (MIRALAX / GLYCOLAX) PACKET    Take 17 g by mouth daily as needed for mild constipation.    POTASSIUM CHLORIDE SA (K-DUR,KLOR-CON) 20 MEQ TABLET    Take 20 mEq by mouth daily.   SIMVASTATIN (ZOCOR) 40 MG TABLET    Take 40 mg by mouth daily.   WARFARIN (COUMADIN) 5 MG TABLET    Take 2.5-5 mg by mouth See admin instructions. Take 1 tablet Monday ,Wednesday and Friday.  Take 0.5 tablet on  Tuesday, Thursday, Saturday and Sunday.  Modified Medications   No medications on file  Discontinued Medications   FUROSEMIDE (LASIX) 40 MG TABLET    Take 40 mg by mouth 2 (two) times daily.     Physical Exam: Filed Vitals:   06/10/14 1008  BP: 140/82  Pulse: 78  Temp: 98.4 F (36.9 C)  TempSrc: Oral  Resp: 18  Height: 5\' 9"  (1.753 m)  Weight: 177 lb 3.2 oz (80.377 kg)  SpO2: 95%  Physical Exam  Constitutional: She is oriented to person, place, and time. She appears well-developed and well-nourished. No distress.  Cardiovascular: Intact distal pulses.   irreg irreg  Pulmonary/Chest: Effort normal and breath sounds normal. No respiratory distress.  Abdominal: Soft. Bowel sounds are normal. She exhibits distension. She exhibits no mass. There is no tenderness.  Midline cicatrix from prior surgery  Musculoskeletal: Normal range of motion. She exhibits no edema or tenderness.  Neurological: She is alert and oriented to person, place, and time.  Skin: Skin is warm and dry.  Psychiatric: She has a normal mood and affect.    Labs reviewed: Basic Metabolic Panel:  Recent Labs  06/30/13 0928 12/03/13 0254  01/29/14 0414 01/30/14 0235 01/31/14 0530  04/07/14 0249 04/08/14 0415 06/07/14 0916  NA 148* 142  < > 142 142 141  < > 146 143 143  K 3.8 4.3  < > 3.4* 3.5* 3.8  < > 3.3* 3.9 3.5  CL 101 101  < > 102 102 99  < > 106 103 101  CO2 25 29  < > 24 25 25   < > 25 23 24   GLUCOSE 90 86  < > 121* 117* 123*   < > 129* 104* 123*  BUN 14 19  < > 6 6 8   < > 11 9 10   CREATININE 1.20* 1.45*  < > 0.85 0.81 0.92  < > 0.90 0.98 1.13*  CALCIUM 9.6 9.5  < > 8.9 9.1 9.5  < > 8.6 9.2 9.2  MG  --   --   < > 1.5 1.7 1.9  --   --   --   --   TSH 1.380 3.620  --   --   --   --   --   --   --  3.500  < > = values in this interval not displayed. Liver Function Tests:  Recent Labs  01/26/14 0435 04/02/14 2233 04/03/14 0549  AST 15 16 25   ALT 8 12 13   ALKPHOS 68 109 108  BILITOT 0.6 0.5 0.6  PROT 6.8 7.6 6.8  ALBUMIN 3.0* 3.8 3.4*    Recent Labs  04/02/14 2233  LIPASE 47   No results for input(s): AMMONIA in the last 8760 hours. CBC:  Recent Labs  02/15/14 1153 04/02/14 2233  04/06/14 0354 04/07/14 0249 06/07/14 0916  WBC 8.4 11.6*  < > 9.0 7.8 7.2  NEUTROABS 5.5 9.2*  --   --   --  4.1  HGB 12.0 12.2  < > 10.9* 11.5* 11.9  HCT 34.8* 35.0*  < > 32.4* 33.1* 36.6  MCV 82.3 78.3  < > 80.0 79.2 79  PLT 471* 335  < > 317 325 325  < > = values in this interval not displayed. Lipid Panel:  Recent Labs  12/03/13 0833 06/07/14 0916  HDL 59 62  LDLCALC 103* 88  TRIG 109 95  CHOLHDL 3.1 2.7   Lab Results  Component Value Date  HGBA1C 6.0* 06/07/2014    Assessment/Plan 1. Small bowel obstruction due to adhesions - has been recurrent (2 episodes where she went to the hospital), but says she has this occasionally where she is very constipated and uncomfortable for several days, then it goes away -is using miralax for those episodes now with relief typically, but is frustrated that she's had this trouble since her gallbladder surgery due to the adhesions  2. Atrial fibrillation with RVR -continues on coumadin with INR goal 2-3 through cardiology who monitors it -last INR therapeutic -cont metoprolol and diltiazem  3. Diabetic nephropathy associated with type 2 diabetes mellitus -cont glipizide, metformin -no hypoglycemia  4. Hypothyroidism due to acquired atrophy of thyroid -cont  synthroid 33mcg daily, TSH was wnl  5. Chronic constipation -cont miralax and high fiber diet  6. 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  7.  Got flu shot today  Labs/tests ordered:   Orders Placed This Encounter  Procedures  . Hemoglobin A1c    Standing Status: Future     Number of Occurrences:      Standing Expiration Date: 02/08/2015  . CBC With differential/Platelet    Standing Status: Future     Number of Occurrences:      Standing Expiration Date: 02/08/2015  . Basic metabolic panel    Standing Status: Future     Number of Occurrences:      Standing Expiration Date: 02/08/2015    Order Specific Question:  Has the patient fasted?    Answer:  Yes    Next appt:  4 mos  prevnar next time  Iyanni Hepp L. Skylarr Liz, D.O. [facility] [address], [place] [address] Cell Phone (Mon-Fri 8am-5pm):  [phone] On Call:  [phone] & follow prompts after 5pm & weekends Office Phone:  [phone] Office Fax:  [phone]

## 2014-06-23 ENCOUNTER — Telehealth: Payer: Self-pay | Admitting: Internal Medicine

## 2014-06-23 NOTE — Telephone Encounter (Signed)
Received a call from [facility] with [facility].She stated patient in their CHF program and she needed to know last EF.Last echo 12/04/09 revealed EF 45 to 50 %.

## 2014-06-25 ENCOUNTER — Ambulatory Visit (INDEPENDENT_AMBULATORY_CARE_PROVIDER_SITE_OTHER): Payer: Medicare Other | Admitting: Pharmacist Clinician (PhC)/ Clinical Pharmacy Specialist

## 2014-06-25 DIAGNOSIS — I4891 Unspecified atrial fibrillation: Secondary | ICD-10-CM

## 2014-06-25 DIAGNOSIS — Z7901 Long term (current) use of anticoagulants: Secondary | ICD-10-CM

## 2014-06-25 LAB — POCT INR: INR: 3.2

## 2014-06-29 ENCOUNTER — Other Ambulatory Visit: Payer: Self-pay | Admitting: *Deleted

## 2014-06-29 MED ORDER — POTASSIUM CHLORIDE CRYS ER 20 MEQ PO TBCR: EXTENDED_RELEASE_TABLET | ORAL | Status: DC

## 2014-06-29 NOTE — Telephone Encounter (Signed)
[facility] 

## 2014-07-03 IMAGING — RF DG ERCP WO/W SPHINCTEROTOMY
1 series · 1 of 1 positions shown · non-contrast
Comparison: CT abdomen and pelvis 05/22/2012

CLINICAL DATA: Removal of biliary stent.

ERCP
TECHNIQUE: Multiple spot images obtained with the fluoroscopic
device and submitted for interpretation post-procedure.  ERCP was
performed by Dr. Azaza.

[Series 1: run · 1 of 1 slices shown]
[im 1/1]
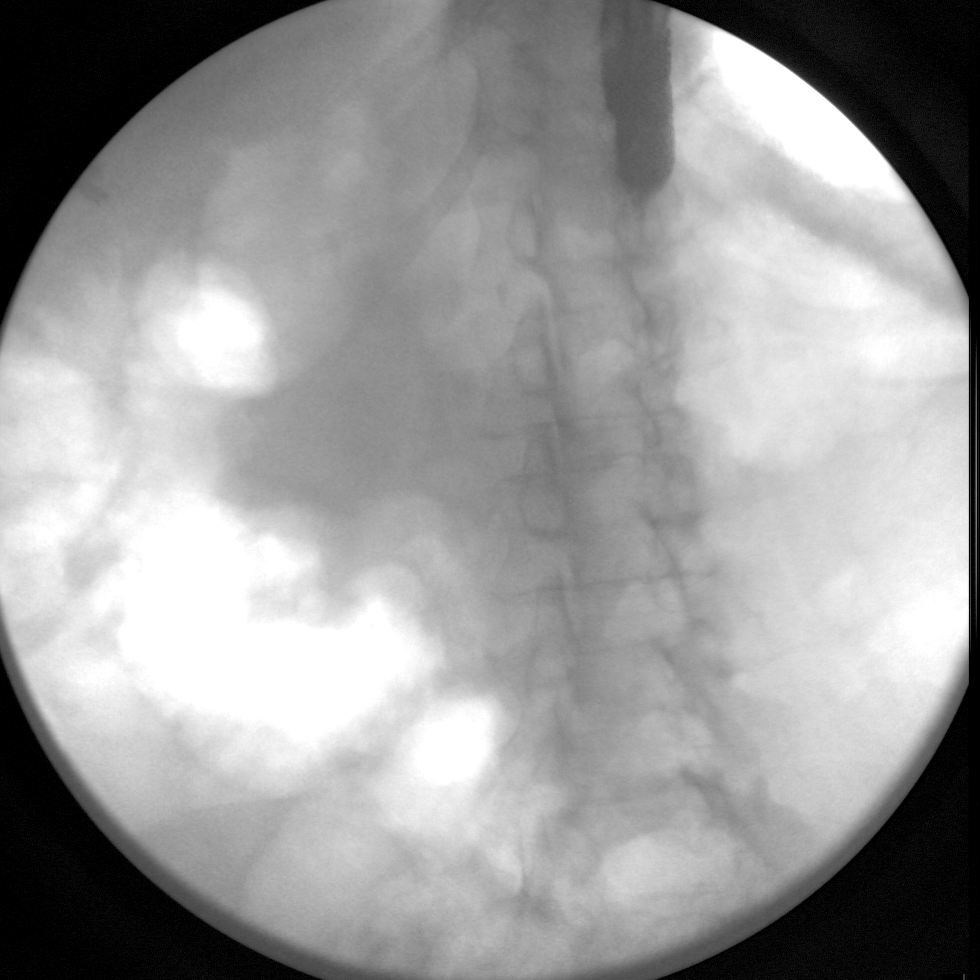

[1 of 1 positions shown; findings below may reference images not displayed]

FINDINGS: A single fluoroscopic spot view of the abdomen is
provided.  The distal end of an endoscope is seen projecting near
the gastroesophageal junction.  The common bile duct stent seen on
the comparison study is not visualized.
IMPRESSION: Removal of common bile duct stent.

These images were submitted for radiologic interpretation only.
Please see the procedural report for the amount of contrast and the
fluoroscopy time utilized.

## 2014-07-09 ENCOUNTER — Telehealth: Payer: Self-pay | Admitting: *Deleted

## 2014-07-09 NOTE — Telephone Encounter (Signed)
Patient called and stated that she had itching all over and Benedryl is not helping/working. I offered patient an appointment for today and for Monday and she denied both. Stated that she only wanted to see Dr. Mariea Clonts. Patient stated that she will call back later.

## 2014-07-14 LAB — HM MAMMOGRAPHY

## 2014-07-20 ENCOUNTER — Encounter: Payer: Self-pay | Admitting: *Deleted

## 2014-07-26 ENCOUNTER — Ambulatory Visit: Payer: Medicare Other | Admitting: Pharmacist Clinician (PhC)/ Clinical Pharmacy Specialist

## 2014-07-26 ENCOUNTER — Other Ambulatory Visit: Payer: Self-pay | Admitting: *Deleted

## 2014-07-26 MED ORDER — SIMVASTATIN 40 MG PO TABS: ORAL_TABLET | ORAL | Status: DC

## 2014-07-26 NOTE — Telephone Encounter (Signed)
[facility] 

## 2014-07-27 ENCOUNTER — Other Ambulatory Visit: Payer: Self-pay | Admitting: *Deleted

## 2014-07-27 MED ORDER — LEVOTHYROXINE SODIUM 50 MCG PO TABS: ORAL_TABLET | ORAL | Status: DC

## 2014-07-27 NOTE — Telephone Encounter (Signed)
[facility] 

## 2014-08-04 DIAGNOSIS — N6002 Solitary cyst of left breast: Secondary | ICD-10-CM | POA: Diagnosis not present

## 2014-08-04 LAB — HM MAMMOGRAPHY

## 2014-08-05 ENCOUNTER — Encounter: Payer: Self-pay | Admitting: *Deleted

## 2014-08-16 ENCOUNTER — Encounter: Payer: Self-pay | Admitting: Internal Medicine

## 2014-08-20 ENCOUNTER — Other Ambulatory Visit: Payer: Self-pay | Admitting: *Deleted

## 2014-08-20 MED ORDER — METFORMIN HCL 500 MG PO TABS: 500.0000 mg | ORAL_TABLET | Freq: Two times a day (BID) | ORAL | Status: DC

## 2014-08-20 NOTE — Telephone Encounter (Signed)
Patient requested and faxed to pharmacy 

## 2014-08-24 ENCOUNTER — Other Ambulatory Visit: Payer: Self-pay | Admitting: Internal Medicine

## 2014-09-23 ENCOUNTER — Telehealth: Payer: Self-pay | Admitting: Internal Medicine

## 2014-09-27 NOTE — Telephone Encounter (Signed)
Close encounter 

## 2014-10-04 ENCOUNTER — Emergency Department (HOSPITAL_COMMUNITY)
Admission: EM | Admit: 2014-10-04 | Discharge: 2014-10-04 | Disposition: A | Discharge status: Home / Self Care | Payer: Medicare Other | Attending: Emergency Medicine | Admitting: Emergency Medicine

## 2014-10-04 ENCOUNTER — Emergency Department (HOSPITAL_COMMUNITY): Payer: Medicare Other

## 2014-10-04 DIAGNOSIS — W06XXXA Fall from bed, initial encounter: Secondary | ICD-10-CM | POA: Insufficient documentation

## 2014-10-04 DIAGNOSIS — S01511A Laceration without foreign body of lip, initial encounter: Secondary | ICD-10-CM | POA: Diagnosis not present

## 2014-10-04 DIAGNOSIS — Z8719 Personal history of other diseases of the digestive system: Secondary | ICD-10-CM | POA: Insufficient documentation

## 2014-10-04 DIAGNOSIS — E780 Pure hypercholesterolemia: Secondary | ICD-10-CM | POA: Insufficient documentation

## 2014-10-04 DIAGNOSIS — Y939 Activity, unspecified: Secondary | ICD-10-CM | POA: Insufficient documentation

## 2014-10-04 DIAGNOSIS — Z862 Personal history of diseases of the blood and blood-forming organs and certain disorders involving the immune mechanism: Secondary | ICD-10-CM | POA: Diagnosis not present

## 2014-10-04 DIAGNOSIS — Z87442 Personal history of urinary calculi: Secondary | ICD-10-CM | POA: Insufficient documentation

## 2014-10-04 DIAGNOSIS — Z79899 Other long term (current) drug therapy: Secondary | ICD-10-CM | POA: Diagnosis not present

## 2014-10-04 DIAGNOSIS — E119 Type 2 diabetes mellitus without complications: Secondary | ICD-10-CM | POA: Insufficient documentation

## 2014-10-04 DIAGNOSIS — T148XXA Other injury of unspecified body region, initial encounter: Secondary | ICD-10-CM

## 2014-10-04 DIAGNOSIS — S0993XA Unspecified injury of face, initial encounter: Secondary | ICD-10-CM | POA: Diagnosis present

## 2014-10-04 DIAGNOSIS — R6884 Jaw pain: Secondary | ICD-10-CM | POA: Diagnosis not present

## 2014-10-04 DIAGNOSIS — E039 Hypothyroidism, unspecified: Secondary | ICD-10-CM | POA: Insufficient documentation

## 2014-10-04 DIAGNOSIS — E785 Hyperlipidemia, unspecified: Secondary | ICD-10-CM | POA: Insufficient documentation

## 2014-10-04 DIAGNOSIS — Z7901 Long term (current) use of anticoagulants: Secondary | ICD-10-CM | POA: Diagnosis not present

## 2014-10-04 DIAGNOSIS — N189 Chronic kidney disease, unspecified: Secondary | ICD-10-CM | POA: Insufficient documentation

## 2014-10-04 DIAGNOSIS — I129 Hypertensive chronic kidney disease with stage 1 through stage 4 chronic kidney disease, or unspecified chronic kidney disease: Secondary | ICD-10-CM | POA: Insufficient documentation

## 2014-10-04 DIAGNOSIS — Z8739 Personal history of other diseases of the musculoskeletal system and connective tissue: Secondary | ICD-10-CM | POA: Insufficient documentation

## 2014-10-04 DIAGNOSIS — R0789 Other chest pain: Secondary | ICD-10-CM

## 2014-10-04 DIAGNOSIS — I4891 Unspecified atrial fibrillation: Secondary | ICD-10-CM | POA: Insufficient documentation

## 2014-10-04 DIAGNOSIS — Z8659 Personal history of other mental and behavioral disorders: Secondary | ICD-10-CM | POA: Diagnosis not present

## 2014-10-04 DIAGNOSIS — Y999 Unspecified external cause status: Secondary | ICD-10-CM | POA: Insufficient documentation

## 2014-10-04 DIAGNOSIS — Z85038 Personal history of other malignant neoplasm of large intestine: Secondary | ICD-10-CM | POA: Diagnosis not present

## 2014-10-04 DIAGNOSIS — S0083XA Contusion of other part of head, initial encounter: Secondary | ICD-10-CM | POA: Diagnosis not present

## 2014-10-04 DIAGNOSIS — S29001A Unspecified injury of muscle and tendon of front wall of thorax, initial encounter: Secondary | ICD-10-CM | POA: Insufficient documentation

## 2014-10-04 DIAGNOSIS — Y929 Unspecified place or not applicable: Secondary | ICD-10-CM | POA: Diagnosis not present

## 2014-10-04 DIAGNOSIS — I5022 Chronic systolic (congestive) heart failure: Secondary | ICD-10-CM | POA: Insufficient documentation

## 2014-10-04 DIAGNOSIS — E876 Hypokalemia: Secondary | ICD-10-CM | POA: Insufficient documentation

## 2014-10-04 DIAGNOSIS — R609 Edema, unspecified: Secondary | ICD-10-CM

## 2014-10-04 DIAGNOSIS — R079 Chest pain, unspecified: Secondary | ICD-10-CM | POA: Diagnosis not present

## 2014-10-04 LAB — COMPREHENSIVE METABOLIC PANEL
ALBUMIN: 3.7 g/dL (ref 3.5–5.2)
ALK PHOS: 107 U/L (ref 39–117)
ALT: 13 U/L (ref 0–35)
AST: 23 U/L (ref 0–37)
Anion gap: 13 (ref 5–15)
BILIRUBIN TOTAL: 0.9 mg/dL (ref 0.3–1.2)
BUN: 11 mg/dL (ref 6–23)
CHLORIDE: 102 mmol/L (ref 96–112)
CO2: 26 mmol/L (ref 19–32)
Calcium: 9.2 mg/dL (ref 8.4–10.5)
Creatinine, Ser: 1.37 mg/dL — ABNORMAL HIGH (ref 0.50–1.10)
GFR, EST AFRICAN AMERICAN: 41 mL/min — AB (ref >90)
GFR, EST NON AFRICAN AMERICAN: 35 mL/min — AB (ref >90)
Glucose, Bld: 127 mg/dL — ABNORMAL HIGH (ref 70–99)
Potassium: 3.4 mmol/L — ABNORMAL LOW (ref 3.5–5.1)
Sodium: 141 mmol/L (ref 135–145)
Total Protein: 7.7 g/dL (ref 6.0–8.3)

## 2014-10-04 LAB — CBC WITH DIFFERENTIAL/PLATELET
BASOS PCT: 0 % (ref 0–1)
Basophils Absolute: 0 10*3/uL (ref 0.0–0.1)
EOS ABS: 0 10*3/uL (ref 0.0–0.7)
Eosinophils Relative: 0 % (ref 0–5)
HCT: 38.2 % (ref 36.0–46.0)
HEMOGLOBIN: 13 g/dL (ref 12.0–15.0)
LYMPHS ABS: 1.5 10*3/uL (ref 0.7–4.0)
Lymphocytes Relative: 22 % (ref 12–46)
MCH: 26.3 pg (ref 26.0–34.0)
MCHC: 34 g/dL (ref 30.0–36.0)
MCV: 77.3 fL — ABNORMAL LOW (ref 78.0–100.0)
Monocytes Absolute: 0.8 10*3/uL (ref 0.1–1.0)
Monocytes Relative: 12 % (ref 3–12)
NEUTROS PCT: 66 % (ref 43–77)
Neutro Abs: 4.4 10*3/uL (ref 1.7–7.7)
PLATELETS: 288 10*3/uL (ref 150–400)
RBC: 4.94 MIL/uL (ref 3.87–5.11)
RDW: 18.6 % — ABNORMAL HIGH (ref 11.5–15.5)
WBC: 6.8 10*3/uL (ref 4.0–10.5)

## 2014-10-04 LAB — I-STAT TROPONIN, ED
TROPONIN I, POC: 0.01 ng/mL (ref 0.00–0.08)
Troponin i, poc: 0 ng/mL (ref 0.00–0.08)

## 2014-10-04 LAB — PROTIME-INR
INR: 2.38 — AB (ref 0.00–1.49)
Prothrombin Time: 26.2 seconds — ABNORMAL HIGH (ref 11.6–15.2)

## 2014-10-04 MED ORDER — ONDANSETRON HCL 4 MG/2ML IJ SOLN
4.0000 mg | Freq: Once | INTRAMUSCULAR | Status: AC
  Administered 2014-10-04: 4 mg via INTRAVENOUS
  Filled 2014-10-04: qty 2

## 2014-10-04 MED ORDER — DIPHENHYDRAMINE HCL 50 MG/ML IJ SOLN
25.0000 mg | Freq: Once | INTRAMUSCULAR | Status: AC
  Administered 2014-10-04: 25 mg via INTRAVENOUS
  Filled 2014-10-04: qty 1

## 2014-10-04 MED ORDER — BACITRACIN ZINC 500 UNIT/GM EX OINT: 1.0000 "application " | TOPICAL_OINTMENT | Freq: Two times a day (BID) | CUTANEOUS | Status: DC

## 2014-10-04 MED ORDER — NITROGLYCERIN 0.4 MG SL SUBL
0.4000 mg | SUBLINGUAL_TABLET | SUBLINGUAL | Status: AC | PRN
  Administered 2014-10-04 (×3): 0.4 mg via SUBLINGUAL

## 2014-10-04 MED ORDER — MORPHINE SULFATE 4 MG/ML IJ SOLN
4.0000 mg | Freq: Once | INTRAMUSCULAR | Status: AC
  Administered 2014-10-04: 4 mg via INTRAVENOUS
  Filled 2014-10-04: qty 1

## 2014-10-04 MED ORDER — METOPROLOL TARTRATE 25 MG PO TABS
25.0000 mg | ORAL_TABLET | Freq: Once | ORAL | Status: AC
  Administered 2014-10-04: 25 mg via ORAL
  Filled 2014-10-04: qty 1

## 2014-10-04 MED ORDER — HYDROCODONE-ACETAMINOPHEN 5-325 MG PO TABS: 1.0000 | ORAL_TABLET | Freq: Four times a day (QID) | ORAL | Status: DC | PRN

## 2014-10-04 NOTE — ED Provider Notes (Signed)
CSN: [id number]     Arrival date & time 10/04/14  8413 History   First MD Initiated Contact with Patient 10/04/14 [phone]     Chief Complaint  Patient presents with  . Mouth Injury     (Consider location/radiation/quality/duration/timing/severity/associated sxs/prior Treatment) The history is provided by the patient.  Nicole [name] is a 81 y.o. female hx of DM, CHF, AAA, afib on coumadin here with fall. She was laying bed yesterday and rolled out of bed and hit the nightstand on the right side of her mouth. She noticed some bleeding that stopped this morning she noticed more swelling and ecchymosis of the jaw and mouth. Denies any head injury or neck pain. She is on Coumadin currently.    Past Medical History  Diagnosis Date  . Colon cancer 07/1997  . Hypertension   . Diabetes mellitus   . CHF (congestive heart failure)   . AAA (abdominal aortic aneurysm)   . Atrial fibrillation   . Hypercholesteremia   . Kidney stone     renal calculi  . Small bowel obstruction due to adhesions 05/16/2012  . Hypothyroidism   . Arthritis   . Anemia   . Cholelithiasis   . Perforation of bile duct   . Hemorrhage of rectum and anus   . Swelling, mass, or lump in head and neck   . Cervicalgia   . Hypoglycemia, unspecified   . Pain in joint, lower leg   . Anxiety   . Shortness of breath   . Acute upper respiratory infections of unspecified site   . Depression   . Thoracic aneurysm without mention of rupture   . GERD (gastroesophageal reflux disease)   . Proteinuria   . Other specified cardiac dysrhythmias(427.89)   . Congestive heart failure, unspecified   . Electrolyte and fluid disorders not elsewhere classified   . Other chronic allergic conjunctivitis   . Hypopotassemia   . Chronic systolic heart failure   . Dizziness and giddiness   . Malignant neoplasm of colon, unspecified site   . Other and unspecified hyperlipidemia   . Gout, unspecified   . Atrial fibrillation   . Aortic  aneurysm of unspecified site without mention of rupture   . Pain in joint, site unspecified   . Palpitations   . CKD (chronic kidney disease)   . Diabetes mellitus    Past Surgical History  Procedure Laterality Date  . Hemicolectomy  08/11/1997  . Kidney stone surgery    . Colon surgery      to remove colon ca  . Cholecystectomy  05/14/2012    Procedure: LAPAROSCOPIC CHOLECYSTECTOMY WITH INTRAOPERATIVE CHOLANGIOGRAM;  Surgeon: Haywood Lasso, MD;  Location: [place];  Service: General;  Laterality: N/A;  . Ercp  05/17/2012    Procedure: ENDOSCOPIC RETROGRADE CHOLANGIOPANCREATOGRAPHY (ERCP);  Surgeon: Missy Sabins, MD;  Location: [place];  Service: Gastroenterology;  Laterality: N/A;  . Ercp  08/12/2012    Procedure: ENDOSCOPIC RETROGRADE CHOLANGIOPANCREATOGRAPHY (ERCP);  Surgeon: Missy Sabins, MD;  Location: [facility] ENDOSCOPY;  Service: Endoscopy;  Laterality: N/A;  . Transthoracic echocardiogram  11/2009    EF 45-50%, mild-mod AV regurg; mild MR; mod TR; ascending aorta mildly dilated  . Cardiac catheterization  2008    moderate severe pulm HTN; with elevted pulm capillary wedge pressure    Family History  Problem Relation Age of Onset  . Heart disease Mother   . Stroke Father   . Hypertension Daughter   . Hypertension Son   .  Diabetes Sister   . Hypertension Son    History  Substance Use Topics  . Smoking status: Never Smoker   . Smokeless tobacco: Never Used  . Alcohol Use: No   OB History    No data available     Review of Systems  Musculoskeletal:       Jaw pain   All other systems reviewed and are negative.     Allergies  Iohexol; Z-pak; and Iodinated diagnostic agents  Home Medications   Prior to Admission medications   Medication Sig Start Date End Date Taking? Authorizing Provider  allopurinol (ZYLOPRIM) 100 MG tablet Take 100 mg by mouth daily.  04/14/14  Yes Historical Provider, MD  diltiazem (CARTIA XT) 240 MG 24 hr capsule Take 1 capsule (240 mg total) by  mouth daily. 02/23/14  Yes Pixie Casino, MD  diphenhydrAMINE (BENADRYL) 25 mg capsule Take 25 mg by mouth every 6 (six) hours as needed for itching.   Yes Historical Provider, MD  furosemide (LASIX) 40 MG tablet take 1 tablet by mouth twice a day 04/14/14  Yes Pixie Casino, MD  glipiZIDE (GLUCOTROL) 10 MG tablet Take one tablet by mouth once daily to control blood sugar 06/03/14  Yes [name] [name], DO  indomethacin (INDOCIN) 25 MG capsule Take 1 capsule (25 mg total) by mouth 3 (three) times daily as needed. 04/08/14  Yes Donne Hazel, MD  levothyroxine (SYNTHROID, LEVOTHROID) 50 MCG tablet Take one tablet by mouth 30 minutes before breakfast once daily for thyroid 07/27/14  Yes Mahima Pandey, MD  magnesium oxide (MAG-OX) 400 (241.3 MG) MG tablet Take 400 mg by mouth 2 (two) times daily.   Yes Historical Provider, MD  metFORMIN (GLUCOPHAGE) 500 MG tablet Take 1 tablet (500 mg total) by mouth 2 (two) times daily with a meal. 08/20/14  Yes [name] [name], DO  metoprolol (LOPRESSOR) 25 MG tablet Take 1 tablet (25 mg total) by mouth 2 (two) times daily. 02/15/14  Yes Orpah Greek, MD  potassium chloride SA (K-DUR,KLOR-CON) 20 MEQ tablet take 1 tablet by mouth once daily 08/25/14  Yes [name] [name], DO  simvastatin (ZOCOR) 40 MG tablet Take one tablet by mouth once daily for cholesterol 07/26/14  Yes [name] [name], DO  warfarin (COUMADIN) 5 MG tablet Take 2.5-5 mg by mouth See admin instructions. Take 1 tablet Monday ,Wednesday and Friday.  Take 0.5 tablet on Tuesday, Thursday, Saturday and Sunday.   Yes Historical Provider, MD  polyethylene glycol (MIRALAX / GLYCOLAX) packet Take 17 g by mouth daily as needed for mild constipation.     Historical Provider, MD   BP 124/80 mmHg  Pulse 124  Temp(Src) 98.7 F (37.1 C) (Oral)  Resp 20  Ht 5\' 9"  (1.753 m)  Wt 173 lb (78.472 kg)  BMI 25.54 kg/m2  SpO2 92% Physical Exam  Constitutional: She is oriented to person, place, and time.  Chronically  ill   HENT:  Head: Normocephalic and atraumatic.  R inner lip laceration along the gum line. Not through and through. Ecchymosis R lower jaw. Floor of mouth soft and nontender   Eyes: Conjunctivae are normal. Pupils are equal, round, and reactive to light.  Neck: Normal range of motion. Neck supple.  Cardiovascular: Normal rate, regular rhythm and normal heart sounds.   Pulmonary/Chest: Effort normal and breath sounds normal. No respiratory distress. She has no wheezes. She has no rales.  Abdominal: Soft. Bowel sounds are normal. She exhibits no distension. There  is no tenderness. There is no rebound.  Musculoskeletal: Normal range of motion. She exhibits no edema or tenderness.  Neurological: She is alert and oriented to person, place, and time. No cranial nerve deficit. Coordination normal.  Skin: Skin is warm and dry.  Psychiatric: She has a normal mood and affect. Her behavior is normal. Judgment and thought content normal.  Nursing note and vitals reviewed.   ED Course  Procedures (including critical care time) Labs Review Labs Reviewed  CBC WITH DIFFERENTIAL/PLATELET - Abnormal; Notable for the following:    MCV 77.3 (*)    RDW 18.6 (*)    All other components within normal limits  COMPREHENSIVE METABOLIC PANEL - Abnormal; Notable for the following:    Potassium 3.4 (*)    Glucose, Bld 127 (*)    Creatinine, Ser 1.37 (*)    GFR calc non Af Amer 35 (*)    GFR calc Af Amer 41 (*)    All other components within normal limits  PROTIME-INR - Abnormal; Notable for the following:    Prothrombin Time 26.2 (*)    INR 2.38 (*)    All other components within normal limits  I-STAT TROPOININ, ED  I-STAT TROPOININ, ED    Imaging Review Dg Chest 2 View  10/04/2014   CLINICAL DATA:  Mid to lower chest pain anteriorly, history hypertension, diabetes, colon cancer, abdominal aortic aneurysm, CHF, GERD, gout, atrial fibrillation  EXAM: CHEST  2 VIEW  COMPARISON:  05/27/2012  FINDINGS:  Enlargement of cardiac silhouette with pulmonary vascular congestion.  Calcified tortuous thoracic aorta.  Lungs clear.  Improved RIGHT basilar aeration versus previous study.  No pleural effusion or pneumothorax.  Bones demineralized.  IMPRESSION: Enlargement of cardiac silhouette with pulmonary vascular congestion.  No acute abnormalities.   Electronically Signed   By: Lavonia Dana M.D.   On: 10/04/2014 13:15   Ct Soft Tissue Neck Wo Contrast  10/04/2014   CLINICAL DATA:  Lower jaw and neck swelling on the right after fall. Mouth injury on night stand.  EXAM: CT NECK WITHOUT CONTRAST  TECHNIQUE: Multidetector CT imaging of the neck was performed following the standard protocol without intravenous contrast.  COMPARISON:  None.  FINDINGS: High-density layering subcutaneous mass in the right lower lip and chin measuring up to 3.5 cm in maximal dimension. The appearance and history is consistent with hematoma, with traumatic edema in the surrounding face and neck. There is no evidence of acute facial fracture or internal foreign body.  Remote appearing superior endplate fracture of T3 with minimal height loss. Diffuse cervical spondylosis with spurring and disc narrowing. No prevertebral swelling.  No traumatic findings in the upper chest. There is a partially visualized proximal aortic aneurysm without evidence of enlargement since 2010 chest CT when the ascending aorta measured 48 mm diameter.  No evidence of mass along the surfaces of the aerodigestive tract. The salivary glands are unremarkable. No deep space swelling or mass effect.  Bilateral cataract resection.  No traumatic findings in the orbits.  Limited intracranial imaging negative for traumatic findings.  Chronic left sphenoid sinusitis with wall thickening and partial opacification.  IMPRESSION: Subcutaneous hematoma in the right chin without fracture.   Electronically Signed   By: Monte Fantasia M.D.   On: 10/04/2014 11:11     EKG  Interpretation   Date/Time:  Monday October 04 2014 11:30:12 EDT Ventricular Rate:  76 PR Interval:    QRS Duration: 114 QT Interval:  495 QTC Calculation: 557 R Axis:  69 Text Interpretation:  Atrial fibrillation Borderline intraventricular  conduction delay RSR' in V1 or V2, probably normal variant Baseline wander  in lead(s) V3 rate slower since previous  Confirmed by YAO  MD, DAVID  ([id number]) on 10/04/2014 11:48:52 AM      MDM   Final diagnoses:  Swelling    AUDELIA KNAPE is a 81 y.o. female here with R jaw ecchymosis s/p injury. Laceration is not through and through and is not bleeding and > 24 hr already so will not suture. Tdap up to date. Will get CT neck to assess extent of bleeding. Will check CBC and INR.   12 pm Patient had chest pain that is left sided. Will get CXR and trop x 2.   3:55 PM Chest pain resolved. Delta trop neg. INR 2.4. CT showed R chin hematoma with no fracture. Stable for d/c.    Wandra Arthurs, MD 10/04/14 1556

## 2014-10-04 NOTE — ED Notes (Signed)
Patient transported to CT 

## 2014-10-04 NOTE — ED Notes (Signed)
Pt stated that the nitro helped her chest pain go to a 4/10.

## 2014-10-04 NOTE — ED Notes (Signed)
Pt off unit with xray 

## 2014-10-04 NOTE — ED Notes (Signed)
Pt presents with maxiofacial injuries related to a fall from laying in bed approxiamntekly 2 feet. Pt states that she hit her mouth on the side of her night stand. Pt denies anyh LOC. Last tdap unknown. Pt states " I keep up with my shots' Pt is on bloodthinner

## 2014-10-04 NOTE — Discharge Instructions (Signed)
You have mouth laceration. Soft diet for a week.   Your chin may get more black and blue.   Take vicodin for pain.   Follow up with your doctor.   Return to ER if you have chest pain, unable to eat, shortness of breath.

## 2014-10-04 NOTE — ED Notes (Signed)
MD at the bedside  

## 2014-10-12 ENCOUNTER — Other Ambulatory Visit: Payer: Medicare Other

## 2014-10-12 DIAGNOSIS — N08 Glomerular disorders in diseases classified elsewhere: Secondary | ICD-10-CM | POA: Diagnosis not present

## 2014-10-12 DIAGNOSIS — E1129 Type 2 diabetes mellitus with other diabetic kidney complication: Secondary | ICD-10-CM | POA: Diagnosis not present

## 2014-10-12 DIAGNOSIS — E1121 Type 2 diabetes mellitus with diabetic nephropathy: Secondary | ICD-10-CM

## 2014-10-13 LAB — BASIC METABOLIC PANEL
BUN/Creatinine Ratio: 13 (ref 11–26)
BUN: 16 mg/dL (ref 8–27)
CO2: 24 mmol/L (ref 18–29)
Calcium: 9 mg/dL (ref 8.7–10.3)
Chloride: 100 mmol/L (ref 97–108)
Creatinine, Ser: 1.25 mg/dL — ABNORMAL HIGH (ref 0.57–1.00)
GFR calc Af Amer: 47 mL/min/{1.73_m2} — ABNORMAL LOW (ref >59)
GFR calc non Af Amer: 40 mL/min/{1.73_m2} — ABNORMAL LOW (ref >59)
Glucose: 122 mg/dL — ABNORMAL HIGH (ref 65–99)
Potassium: 4 mmol/L (ref 3.5–5.2)
Sodium: 143 mmol/L (ref 134–144)

## 2014-10-13 LAB — CBC WITH DIFFERENTIAL
Basophils Absolute: 0 10*3/uL (ref 0.0–0.2)
Basos: 0 %
Eos: 1 %
Eosinophils Absolute: 0.1 10*3/uL (ref 0.0–0.4)
HCT: 36.1 % (ref 34.0–46.6)
Hemoglobin: 11.6 g/dL (ref 11.1–15.9)
Immature Grans (Abs): 0 10*3/uL (ref 0.0–0.1)
Immature Granulocytes: 0 %
Lymphocytes Absolute: 1.7 10*3/uL (ref 0.7–3.1)
Lymphs: 24 %
MCH: 25.7 pg — ABNORMAL LOW (ref 26.6–33.0)
MCHC: 32.1 g/dL (ref 31.5–35.7)
MCV: 80 fL (ref 79–97)
Monocytes Absolute: 0.8 10*3/uL (ref 0.1–0.9)
Monocytes: 11 %
Neutrophils Absolute: 4.5 10*3/uL (ref 1.4–7.0)
Neutrophils Relative %: 64 %
RBC: 4.51 x10E6/uL (ref 3.77–5.28)
RDW: 18.7 % — ABNORMAL HIGH (ref 12.3–15.4)
WBC: 7.1 10*3/uL (ref 3.4–10.8)

## 2014-10-13 LAB — HEMOGLOBIN A1C
Est. average glucose Bld gHb Est-mCnc: 126 mg/dL
Hgb A1c MFr Bld: 6 % — ABNORMAL HIGH (ref 4.8–5.6)

## 2014-10-14 ENCOUNTER — Ambulatory Visit: Payer: Medicare Other | Admitting: Internal Medicine

## 2014-10-22 ENCOUNTER — Ambulatory Visit (INDEPENDENT_AMBULATORY_CARE_PROVIDER_SITE_OTHER): Payer: Medicare Other | Admitting: Internal Medicine

## 2014-10-22 ENCOUNTER — Encounter: Payer: Self-pay | Admitting: Internal Medicine

## 2014-10-22 VITALS — BP 130/80 | HR 63 | Temp 98.2°F | Resp 20 | Ht 69.0 in | Wt 172.6 lb

## 2014-10-22 DIAGNOSIS — W06XXXD Fall from bed, subsequent encounter: Secondary | ICD-10-CM

## 2014-10-22 DIAGNOSIS — Z23 Encounter for immunization: Secondary | ICD-10-CM | POA: Diagnosis not present

## 2014-10-22 DIAGNOSIS — S0083XD Contusion of other part of head, subsequent encounter: Secondary | ICD-10-CM | POA: Diagnosis not present

## 2014-10-22 DIAGNOSIS — I4891 Unspecified atrial fibrillation: Secondary | ICD-10-CM | POA: Diagnosis not present

## 2014-10-22 DIAGNOSIS — E038 Other specified hypothyroidism: Secondary | ICD-10-CM | POA: Diagnosis not present

## 2014-10-22 DIAGNOSIS — E034 Atrophy of thyroid (acquired): Secondary | ICD-10-CM

## 2014-10-22 DIAGNOSIS — E1121 Type 2 diabetes mellitus with diabetic nephropathy: Secondary | ICD-10-CM

## 2014-10-22 NOTE — Progress Notes (Signed)
Patient ID: Nicole [name], female   DOB: 11-Aug-1930, 81 y.o.   MRN: [id number]   Location:  [facility]  Code Status: DNR  Allergies  Allergen Reactions  . Iohexol      Code: VOM, Desc: PT REPORTS VOMITING W/ IVP DYE- ARS 12/26/08, Onset Date: [id number]   . Z-Pak [Azithromycin]   . Iodinated Diagnostic Agents Nausea Only    PT HAS HAD SINGULAR INCIDENT OF NAUSEA  W/ IV CONTYRAST INJECTION, NONE IF INJECTED SLOWLY//A.Nicole [name]    Chief Complaint  Patient presents with  . Medical Management of Chronic Issues    4 month follow-up, Discuss labs (copy printed)  . Diabetic Testing    Discuss if patient should be checking blood sugar and if yes how many times daily.    HPI: Patient is a 81 y.o. black female seen in the office today for med mgt of chronic diseases.    Fell Sunday.  Was trying to get oob to go to restroom, got tangled in sheets, fell down on right side.  Hit chin.  Thought she going to pass out.  Had urinated on self.  Thought everything was broken.  Took a little bit to get up.  Had chest pain in ED, but thinks she was worried.  Cardiac workup.  INR was 2.4 on her coumadin.  CXR unremarkable.  CT neck showed subcutaneous hematoma in the right chin w/o fracture.     Sugar average remains 6.  Blood counts stable.  Kidneys are a bit improved.    Doing well otherwise.  Except that she gets tired quick.  Says it's good she doesn't have to do too much.    No low glucose as far as she knows.  Has not been checking it.     Review of Systems:  Review of Systems  Constitutional: Positive for malaise/fatigue. Negative for fever and chills.  HENT: Negative for congestion.   Eyes: Negative for blurred vision.  Respiratory: Negative for shortness of breath.   Cardiovascular: Negative for chest pain, palpitations and leg swelling.       Varicose veins  Gastrointestinal: Positive for constipation. Negative for abdominal pain, blood in stool  and melena.       Intermittent abdominal discomfort when constipated  Genitourinary: Negative for dysuria, urgency and frequency.  Musculoskeletal: Positive for falls.       One fall  Skin: Negative for rash.  Neurological: Negative for dizziness, loss of consciousness and weakness.  Endo/Heme/Allergies: Bruises/bleeds easily.  Psychiatric/Behavioral: Negative for depression and memory loss.    Past Medical History  Diagnosis Date  . Colon cancer 07/1997  . Hypertension   . Diabetes mellitus   . CHF (congestive heart failure)   . AAA (abdominal aortic aneurysm)   . Atrial fibrillation   . Hypercholesteremia   . Kidney stone     renal calculi  . Small bowel obstruction due to adhesions 05/16/2012  . Hypothyroidism   . Arthritis   . Anemia   . Cholelithiasis   . Perforation of bile duct   . Hemorrhage of rectum and anus   . Swelling, mass, or lump in head and neck   . Cervicalgia   . Hypoglycemia, unspecified   . Pain in joint, lower leg   . Anxiety   . Shortness of breath   . Acute upper respiratory infections of unspecified site   . Depression   . Thoracic aneurysm without mention of rupture   .  GERD (gastroesophageal reflux disease)   . Proteinuria   . Other specified cardiac dysrhythmias(427.89)   . Congestive heart failure, unspecified   . Electrolyte and fluid disorders not elsewhere classified   . Other chronic allergic conjunctivitis   . Hypopotassemia   . Chronic systolic heart failure   . Dizziness and giddiness   . Malignant neoplasm of colon, unspecified site   . Other and unspecified hyperlipidemia   . Gout, unspecified   . Atrial fibrillation   . Aortic aneurysm of unspecified site without mention of rupture   . Pain in joint, site unspecified   . Palpitations   . CKD (chronic kidney disease)   . Diabetes mellitus     Past Surgical History  Procedure Laterality Date  . Hemicolectomy  08/11/1997  . Kidney stone surgery    . Colon surgery       to remove colon ca  . Cholecystectomy  05/14/2012    Procedure: LAPAROSCOPIC CHOLECYSTECTOMY WITH INTRAOPERATIVE CHOLANGIOGRAM;  Surgeon: Haywood Lasso, MD;  Location: [place];  Service: General;  Laterality: N/A;  . Ercp  05/17/2012    Procedure: ENDOSCOPIC RETROGRADE CHOLANGIOPANCREATOGRAPHY (ERCP);  Surgeon: Missy Sabins, MD;  Location: [place];  Service: Gastroenterology;  Laterality: N/A;  . Ercp  08/12/2012    Procedure: ENDOSCOPIC RETROGRADE CHOLANGIOPANCREATOGRAPHY (ERCP);  Surgeon: Missy Sabins, MD;  Location: [facility] ENDOSCOPY;  Service: Endoscopy;  Laterality: N/A;  . Transthoracic echocardiogram  11/2009    EF 45-50%, mild-mod AV regurg; mild MR; mod TR; ascending aorta mildly dilated  . Cardiac catheterization  2008    moderate severe pulm HTN; with elevted pulm capillary wedge pressure     Social History:   reports that she has never smoked. She has never used smokeless tobacco. She reports that she does not drink alcohol or use illicit drugs.  Family History  Problem Relation Age of Onset  . Heart disease Mother   . Stroke Father   . Hypertension Daughter   . Hypertension Son   . Diabetes Sister   . Hypertension Son     Medications: Patient's Medications  New Prescriptions   No medications on file  Previous Medications   ALLOPURINOL (ZYLOPRIM) 100 MG TABLET    Take 100 mg by mouth daily.    BACITRACIN OINTMENT    Apply 1 application topically 2 (two) times daily.   DILTIAZEM (CARTIA XT) 240 MG 24 HR CAPSULE    Take 1 capsule (240 mg total) by mouth daily.   DIPHENHYDRAMINE (BENADRYL) 25 MG CAPSULE    Take 25 mg by mouth every 6 (six) hours as needed for itching.   FUROSEMIDE (LASIX) 40 MG TABLET    take 1 tablet by mouth twice a day   GLIPIZIDE (GLUCOTROL) 10 MG TABLET    Take one tablet by mouth once daily to control blood sugar   HYDROCODONE-ACETAMINOPHEN (NORCO/VICODIN) 5-325 MG PER TABLET    Take 1 tablet by mouth every 6 (six) hours as needed.   INDOMETHACIN  (INDOCIN) 25 MG CAPSULE    Take 1 capsule (25 mg total) by mouth 3 (three) times daily as needed.   LANCET DEVICES (ADJUSTABLE LANCING DEVICE) MISC    Check blood sugar once daily as directed   LEVOTHYROXINE (SYNTHROID, LEVOTHROID) 50 MCG TABLET    Take one tablet by mouth 30 minutes before breakfast once daily for thyroid   MAGNESIUM OXIDE (MAG-OX) 400 (241.3 MG) MG TABLET    Take 400 mg by mouth 2 (two) times  daily.   METFORMIN (GLUCOPHAGE) 500 MG TABLET    Take 1 tablet (500 mg total) by mouth 2 (two) times daily with a meal.   METOPROLOL (LOPRESSOR) 25 MG TABLET    Take 1 tablet (25 mg total) by mouth 2 (two) times daily.   ONE TOUCH ULTRA TEST TEST STRIP    Check blood sugar once daily as directed   POLYETHYLENE GLYCOL (MIRALAX / GLYCOLAX) PACKET    Take 17 g by mouth daily as needed for mild constipation.    POTASSIUM CHLORIDE SA (K-DUR,KLOR-CON) 20 MEQ TABLET    take 1 tablet by mouth once daily   SIMVASTATIN (ZOCOR) 40 MG TABLET    Take one tablet by mouth once daily for cholesterol   WARFARIN (COUMADIN) 5 MG TABLET    Take 2.5-5 mg by mouth See admin instructions. Take 1 tablet Monday ,Wednesday and Friday.  Take 0.5 tablet on Tuesday, Thursday, Saturday and Sunday.  Modified Medications   No medications on file  Discontinued Medications   No medications on file     Physical Exam: Filed Vitals:   10/22/14 1029  BP: 130/80  Pulse: 63  Temp: 98.2 F (36.8 C)  TempSrc: Oral  Resp: 20  Height: 5\' 9"  (1.753 m)  Weight: 172 lb 9.6 oz (78.291 kg)  SpO2: 91%  Physical Exam  Constitutional: She is oriented to person, place, and time. She appears well-developed and well-nourished. No distress.  Cardiovascular: Normal rate, regular rhythm, normal heart sounds and intact distal pulses.   Pulmonary/Chest: Effort normal and breath sounds normal. No respiratory distress.  Abdominal: Soft. Bowel sounds are normal. She exhibits no distension and no mass. There is no tenderness.    Musculoskeletal: Normal range of motion.  Narrow based gait, appears steady, able to get up and go w/o use of arms  Neurological: She is alert and oriented to person, place, and time.  Skin: Skin is warm and dry.  Has hematoma on chin on right side, ecchymoses and small scabbed region just beneath chin     Labs reviewed: Basic Metabolic Panel:  Recent Labs  12/03/13 0833  01/29/14 0414 01/30/14 0235 01/31/14 0530  06/07/14 0916 10/04/14 0922 10/12/14 0819  NA 142  < > 142 142 141  < > 143 141 143  K 4.3  < > 3.4* 3.5* 3.8  < > 3.5 3.4* 4.0  CL 101  < > 102 102 99  < > 101 102 100  CO2 29  < > 24 25 25   < > 24 26 24   GLUCOSE 86  < > 121* 117* 123*  < > 123* 127* 122*  BUN 19  < > 6 6 8   < > 10 11 16   CREATININE 1.45*  < > 0.85 0.81 0.92  < > 1.13* 1.37* 1.25*  CALCIUM 9.5  < > 8.9 9.1 9.5  < > 9.2 9.2 9.0  MG  --   < > 1.5 1.7 1.9  --   --   --   --   TSH 3.620  --   --   --   --   --  3.500  --   --   < > = values in this interval not displayed. Liver Function Tests:  Recent Labs  04/02/14 2233 04/03/14 0549 10/04/14 0922  AST 16 25 23   ALT 12 13 13   ALKPHOS 109 108 107  BILITOT 0.5 0.6 0.9  PROT 7.6 6.8 7.7  ALBUMIN 3.8 3.4* 3.7  Recent Labs  04/02/14 2233  LIPASE 47   No results for input(s): AMMONIA in the last 8760 hours. CBC:  Recent Labs  04/07/14 0249 06/07/14 0916 10/04/14 0922 10/12/14 0819  WBC 7.8 7.2 6.8 7.1  NEUTROABS  --  4.1 4.4 4.5  HGB 11.5* 11.9 13.0 11.6  HCT 33.1* 36.6 38.2 36.1  MCV 79.2 79 77.3* 80  PLT 325 325 288  --    Lipid Panel:  Recent Labs  12/03/13 0833 06/07/14 0916  CHOL 184 169  HDL 59 62  LDLCALC 103* 88  TRIG 109 95  CHOLHDL 3.1 2.7   Lab Results  Component Value Date   HGBA1C 6.0* 10/12/2014    Assessment/Plan 1. Fall from bed, subsequent encounter -got stuck in sheets trying to get oob to use bathroom -mechanical, seems balance is good and this fall could have happened to anyone -does  have peripheral neuropathy which doesn't help balance  2. Hematoma of jaw, subsequent encounter - is slowly resolving, due to fall -CBC with Differential/Platelet; Future  3. Atrial fibrillation with RVR -cont coumadin regulated by cardiology; INR was therapeutic at ED when fell -cont metoprolol and diltiazem for rate control - CBC with Differential/Platelet; Future  4. Hypothyroidism due to acquired atrophy of thyroid -cont synthoid 65mcg, clinically euthyroid - TSH; Future  5. Diabetic nephropathy associated with type 2 diabetes mellitus -well controlled, cont glucophage, statin, glipizide -given handouts on hypoglycemia and hyperglycemia -did not tolerate ace/arb--cozaar stopped due to acute on chronic renal failure - Hemoglobin A1c; Future - Lipid panel; Future - Basic metabolic panel; Future  6. Need for vaccination with 13-polyvalent pneumococcal conjugate vaccine - Pneumococcal conjugate vaccine 13-valent IM given  Labs/tests ordered:   Orders Placed This Encounter  Procedures  . Pneumococcal conjugate vaccine 13-valent IM  . CBC with Differential/Platelet    Standing Status: Future     Number of Occurrences:      Standing Expiration Date: 06/23/2015  . Hemoglobin A1c    Standing Status: Future     Number of Occurrences:      Standing Expiration Date: 06/23/2015  . Lipid panel    Standing Status: Future     Number of Occurrences:      Standing Expiration Date: 06/23/2015    Order Specific Question:  Has the patient fasted?    Answer:  Yes  . Basic metabolic panel    Standing Status: Future     Number of Occurrences:      Standing Expiration Date: 06/23/2015    Order Specific Question:  Has the patient fasted?    Answer:  Yes  . TSH    Standing Status: Future     Number of Occurrences:      Standing Expiration Date: 06/23/2015    Next appt:  4 mos labs before  Carroll Lingelbach L. Sherita Decoste, D.O. [facility] [address], [place] [address] Cell Phone (Mon-Fri 8am-5pm):  [phone] On Call:  [phone] & follow prompts after 5pm & weekends Office Phone:  [phone] Office Fax:  [phone]

## 2014-11-22 ENCOUNTER — Ambulatory Visit (INDEPENDENT_AMBULATORY_CARE_PROVIDER_SITE_OTHER): Payer: Medicare Other | Admitting: Internal Medicine

## 2014-11-22 ENCOUNTER — Encounter: Payer: Self-pay | Admitting: Internal Medicine

## 2014-11-22 ENCOUNTER — Ambulatory Visit (INDEPENDENT_AMBULATORY_CARE_PROVIDER_SITE_OTHER): Payer: Medicare Other | Admitting: Pharmacist Clinician (PhC)/ Clinical Pharmacy Specialist

## 2014-11-22 VITALS — BP 172/84 | HR 97 | Ht 70.0 in | Wt 173.4 lb

## 2014-11-22 DIAGNOSIS — I4891 Unspecified atrial fibrillation: Secondary | ICD-10-CM | POA: Diagnosis not present

## 2014-11-22 DIAGNOSIS — I429 Cardiomyopathy, unspecified: Secondary | ICD-10-CM | POA: Diagnosis not present

## 2014-11-22 DIAGNOSIS — I10 Essential (primary) hypertension: Secondary | ICD-10-CM

## 2014-11-22 DIAGNOSIS — I712 Thoracic aortic aneurysm, without rupture, unspecified: Secondary | ICD-10-CM

## 2014-11-22 DIAGNOSIS — I5020 Unspecified systolic (congestive) heart failure: Secondary | ICD-10-CM | POA: Diagnosis not present

## 2014-11-22 DIAGNOSIS — Z7901 Long term (current) use of anticoagulants: Secondary | ICD-10-CM | POA: Diagnosis not present

## 2014-11-22 LAB — POCT INR: INR: 2.7

## 2014-11-22 NOTE — Progress Notes (Signed)
OFFICE NOTE  Chief Complaint:  Follow up visit  Primary Care Physician: Hollace Kinnier, DO  HPI:  Nicole [name] is a 81 year old female with history of chronic atrial fibrillation and controlled ventricular response. She was previously followed by Dr. Rex Kras, has a history of congestive heart failure with an EF of about 45% and moderate MR in the past. She has a thoracic aneurysm which has been asymptomatic, as well as hypertension, diabetes, dyslipidemia, and recently was admitted for gallbladder surgery which was complicated by bleeding issues. She has been deemed not a good candidate for Coumadin due to a number of issues with compliance by Dr. Rex Kras, and has been on aspirin and Plavix. Unfortunately, with regard to the Plavix she had complicated bleeding postoperatively from her gallbladder surgery and Plavix was discontinued due to this. It turns out that there is little benefit to it additional Plavix on top of aspirin for stroke reduction. As far as her stroke risk is concerned, it is actually fairly high considering she is in chronic atrial fibrillation and does have a history of hypertension and diabetes and is a female with a CHADS2 score of 3, a CHADS VASc score of 3-1/2 or 4.  Based on this, I recommended starting her on Eliquis.   Nicole [name] returns today for followup. Unfortunately she was just in the hospital with epistaxis. This is following a period of time where she developed small bowel obstruction and had a nasogastric tube in place. This could contribute to possible erosions. She was seen by an ear nose and throat specialist to placed a nasal balloon and ultimately packed her nose. This packing was in for a week and was removed and no further bleeding was noted. It was not mentioned what the cause of her bleeding was. She was on adequate. She still has a high CHADSVASC score. We had recommended discontinuing her eloquence due to bleeding. She was also in A. fib with RVR and I  recommended increasing her diltiazem to 240 mg daily but that was not performed. She's had no further epistaxis.  I saw Nicole [name] back today in the office. Overall she reports feeling very well. Unfortunate she's recently been having significant falls. She says she's been falling out of bed amongst other things and eventually developed significant bruising and has a large resolving hematoma on her right chin. Fortunately there was no intracranial bleeding. She is maintained on warfarin but is not had a check of her INR since March. Her last check in our office was in December. It's unclear why she felt that she did not need to come back for ongoing monthly INR checks. This of warfarin, but fortunately her INR today is 2.7. She remains in atrial fibrillation with a CHADSVASC score of 4. She also has a history of thoracic aortic aneurysm measuring up to 4.8 cm. This is been stable and recently when she had a neck CT, this is partially visualized and also measured 4.8 cm.  PMHx:   Past Medical History  Diagnosis Date  . Colon cancer 07/1997  . Hypertension   . Diabetes mellitus   . CHF (congestive heart failure)   . AAA (abdominal aortic aneurysm)   . Atrial fibrillation   . Hypercholesteremia   . Kidney stone     renal calculi  . Small bowel obstruction due to adhesions 05/16/2012  . Hypothyroidism   . Arthritis   . Anemia   . Cholelithiasis   . Perforation of bile duct   .  Hemorrhage of rectum and anus   . Swelling, mass, or lump in head and neck   . Cervicalgia   . Hypoglycemia, unspecified   . Pain in joint, lower leg   . Anxiety   . Shortness of breath   . Acute upper respiratory infections of unspecified site   . Depression   . Thoracic aneurysm without mention of rupture   . GERD (gastroesophageal reflux disease)   . Proteinuria   . Other specified cardiac dysrhythmias(427.89)   . Congestive heart failure, unspecified   . Electrolyte and fluid disorders not elsewhere  classified   . Other chronic allergic conjunctivitis   . Hypopotassemia   . Chronic systolic heart failure   . Dizziness and giddiness   . Malignant neoplasm of colon, unspecified site   . Other and unspecified hyperlipidemia   . Gout, unspecified   . Atrial fibrillation   . Aortic aneurysm of unspecified site without mention of rupture   . Pain in joint, site unspecified   . Palpitations   . CKD (chronic kidney disease)   . Diabetes mellitus     Past Surgical History  Procedure Laterality Date  . Hemicolectomy  08/11/1997  . Kidney stone surgery    . Colon surgery      to remove colon ca  . Cholecystectomy  05/14/2012    Procedure: LAPAROSCOPIC CHOLECYSTECTOMY WITH INTRAOPERATIVE CHOLANGIOGRAM;  Surgeon: Haywood Lasso, MD;  Location: [facility];  Service: General;  Laterality: N/A;  . Ercp  05/17/2012    Procedure: ENDOSCOPIC RETROGRADE CHOLANGIOPANCREATOGRAPHY (ERCP);  Surgeon: Missy Sabins, MD;  Location: [facility];  Service: Gastroenterology;  Laterality: N/A;  . Ercp  08/12/2012    Procedure: ENDOSCOPIC RETROGRADE CHOLANGIOPANCREATOGRAPHY (ERCP);  Surgeon: Missy Sabins, MD;  Location: [facility] ENDOSCOPY;  Service: Endoscopy;  Laterality: N/A;  . Transthoracic echocardiogram  11/2009    EF 45-50%, mild-mod AV regurg; mild MR; mod TR; ascending aorta mildly dilated  . Cardiac catheterization  2008    moderate severe pulm HTN; with elevted pulm capillary wedge pressure     FAMHx:  Family History  Problem Relation Age of Onset  . Heart disease Mother   . Stroke Father   . Hypertension Daughter   . Hypertension Son   . Diabetes Sister   . Hypertension Son     SOCHx:   reports that she has never smoked. She has never used smokeless tobacco. She reports that she does not drink alcohol or use illicit drugs.  ALLERGIES:  Allergies  Allergen Reactions  . Iohexol      Code: VOM, Desc: PT REPORTS VOMITING W/ IVP DYE- ARS 12/26/08, Onset Date: [id number]   . Z-Pak [Azithromycin]   .  Iodinated Diagnostic Agents Nausea Only    PT HAS HAD SINGULAR INCIDENT OF NAUSEA  W/ IV CONTYRAST INJECTION, NONE IF INJECTED SLOWLY//A.CALHOUN    ROS: A comprehensive review of systems was negative except for: Constitutional: positive for Falls  HOME MEDS: Current Outpatient Prescriptions  Medication Sig Dispense Refill  . allopurinol (ZYLOPRIM) 100 MG tablet Take 100 mg by mouth daily.   0  . bacitracin ointment Apply 1 application topically 2 (two) times daily. 120 g 0  . diltiazem (CARTIA XT) 240 MG 24 hr capsule Take 1 capsule (240 mg total) by mouth daily. 30 capsule 9  . diphenhydrAMINE (BENADRYL) 25 mg capsule Take 25 mg by mouth every 6 (six) hours as needed for itching.    . furosemide (LASIX) 40 MG tablet  take 1 tablet by mouth twice a day 60 tablet 11  . glipiZIDE (GLUCOTROL) 10 MG tablet Take one tablet by mouth once daily to control blood sugar 30 tablet 6  . HYDROcodone-acetaminophen (NORCO/VICODIN) 5-325 MG per tablet Take 1 tablet by mouth every 6 (six) hours as needed. 10 tablet 0  . indomethacin (INDOCIN) 25 MG capsule Take 1 capsule (25 mg total) by mouth 3 (three) times daily as needed. 30 capsule 0  . Lancet Devices (ADJUSTABLE LANCING DEVICE) MISC Check blood sugar once daily as directed    . levothyroxine (SYNTHROID, LEVOTHROID) 50 MCG tablet Take one tablet by mouth 30 minutes before breakfast once daily for thyroid 90 tablet 3  . magnesium oxide (MAG-OX) 400 (241.3 MG) MG tablet Take 400 mg by mouth 2 (two) times daily.    . metFORMIN (GLUCOPHAGE) 500 MG tablet Take 1 tablet (500 mg total) by mouth 2 (two) times daily with a meal. 60 tablet 5  . metoprolol (LOPRESSOR) 25 MG tablet Take 1 tablet (25 mg total) by mouth 2 (two) times daily. 60 tablet 3  . ONE TOUCH ULTRA TEST test strip Check blood sugar once daily as directed    . polyethylene glycol (MIRALAX / GLYCOLAX) packet Take 17 g by mouth daily as needed for mild constipation.     . potassium chloride SA  (K-DUR,KLOR-CON) 20 MEQ tablet take 1 tablet by mouth once daily 30 tablet 5  . simvastatin (ZOCOR) 40 MG tablet Take one tablet by mouth once daily for cholesterol 30 tablet 3  . warfarin (COUMADIN) 5 MG tablet Take 2.5-5 mg by mouth See admin instructions. Take 1 tablet Monday ,Wednesday and Friday.  Take 0.5 tablet on Tuesday, Thursday, Saturday and Sunday.     No current facility-administered medications for this visit.    LABS/IMAGING: No results found for this or any previous visit (from the past 48 hour(s)). No results found.  VITALS: BP 172/84 mmHg  Pulse 97  Ht 5\' 10"  (1.778 m)  Wt 173 lb 6.4 oz (78.654 kg)  BMI 24.88 kg/m2  EXAM: General appearance: alert and no distress Neck: no carotid bruit, no JVD and Right chin hematoma Lungs: clear to auscultation bilaterally Heart: irregularly irregular rhythm Abdomen: soft, non-tender; bowel sounds normal; no masses,  no organomegaly Extremities: extremities normal, atraumatic, no cyanosis or edema Pulses: 2+ and symmetric Skin: Skin color, texture, turgor normal. No rashes or lesions Neurologic: Grossly normal  EKG: A. Fib at 97, incomplete right bundle branch block, nonspecific ST and T-wave changes  ASSESSMENT: 1. Chronic atrial fibrillation - Eliquis on hold for epistaxis, switched to warfarin 2. History of congestive heart failure, EF of 45% 3. Hypertension 4. History of thoracic aneurysm - stable at 4.8 cm (09/2014) 5. Diabetes 6. Dyslipidemia 7. Chronic kidney disease  PLAN: 1.   Nicole [name] unfortunately has been having significant falls recently. Is not clear to me whether or not a mechanism is been identified, but hopefully we can identify that otherwise she may be at increased risk of significant bleeding and warfarin may not be an option. She does have heart failure in the past with an EF of 45%. I recommended a echo 1 year ago which was never performed. She says that she would get another repeat echo this year.  I've gone ahead and ordered that. Her thoracic aortic aneurysm appears stable by limited view of a neck CT in March 2016. INR is therapeutic today. We have reestablish the importance of monthly INR checks  and she plans to be seen by Erasmo Downer in our anticoagulation clinic in one month.  Pixie Casino, MD, [facility] Attending Cardiologist [facility] 11/22/2014, 2:18 PM

## 2014-11-22 NOTE — Patient Instructions (Addendum)
Your physician has requested that you have an echocardiogram. Echocardiography is a painless test that uses sound waves to create images of your heart. It provides your doctor with information about the size and shape of your heart and how well your heart's chambers and valves are working. This procedure takes approximately one hour. There are no restrictions for this procedure.   Your physician wants you to follow-up in: 6 months with Dr. Hilty. You will receive a reminder letter in the mail two months in advance. If you don't receive a letter, please call our office to schedule the follow-up appointment.  

## 2014-11-23 ENCOUNTER — Telehealth: Payer: Self-pay

## 2014-11-23 NOTE — Telephone Encounter (Signed)
Ok thanks 

## 2014-11-23 NOTE — Telephone Encounter (Signed)
Spoke with patient, patient with 4 falls within the last year.  Last fall- feel out of bed trying to get up and go to restroom. Patient with injury resulting from last fall, still with chin swelling x 1 month ago. Patient states she moves too quickly and that is the main source of her falls, patient tries to coach herself to move slower and to get steady before moving.  Patient is interested in PT but not right now. Patient states she will call when she is ready to consider PT. Patient states " I have a lot going on right now and all these visits and appointments are too costly."    Will route to PCP as a [place]

## 2014-11-23 NOTE — Telephone Encounter (Signed)
-----   Message from [name], DO sent at 11/22/2014  5:01 PM EDT ----- Let's call [place] and find out more about her falls.  I was not aware that they were recurrent.  If so, I would like her to get some outpatient PT.  ----- Message -----    From: Pixie Casino, MD    Sent: 11/22/2014   2:23 PM      To: [name], DO  Dr. Mariea Clonts-  Just an [place] has not been compliant with monthly INR checks - fortunately she has been taking her medications and her INR today is therapeutic. We stressed the importance of monthly follow-up. I am concerned about recent falls, including the one recently that took her to the emergency department with a large hematoma. This may be a contraindication to ongoing warfarin. Hopefully you can address the possible causes of her falls and possibly involve specialist such as neurology or physical therapy to help reduce her fall risk. Otherwise we may need to consider taking her off of warfarin. I would hate to do that given her high CHADSVASC score of 4.  Thanks.  -[place]

## 2014-11-24 ENCOUNTER — Other Ambulatory Visit: Payer: Self-pay | Admitting: *Deleted

## 2014-11-24 MED ORDER — ALLOPURINOL 100 MG PO TABS: 100.0000 mg | ORAL_TABLET | Freq: Every day | ORAL | Status: DC

## 2014-11-24 MED ORDER — SIMVASTATIN 40 MG PO TABS: ORAL_TABLET | ORAL | Status: DC

## 2014-12-22 ENCOUNTER — Other Ambulatory Visit (HOSPITAL_COMMUNITY): Payer: Medicare Other

## 2014-12-22 ENCOUNTER — Ambulatory Visit: Payer: Medicare Other | Admitting: Pharmacist Clinician (PhC)/ Clinical Pharmacy Specialist

## 2014-12-31 IMAGING — CT CT ABD-PELV W/ CM
2 of 5 series · 17 of 46 positions shown, 19 images · IV contrast (READICAT/WATER & [ID] OMNI 300)
Comparison: Multiple priors.

CLINICAL DATA: Abdominal distention.  History of cholecystitis,
small bowel obstruction, and common bile duct obstruction.

CT ABDOMEN AND PELVIS WITH CONTRAST
TECHNIQUE: Multidetector CT imaging of the abdomen and pelvis was
performed following the standard protocol during bolus
administration of intravenous contrast.
Contrast: 100mL OMNIPAQUE IOHEXOL 300 MG/ML  SOLN

[Series 2: abd/pelvis with · axial · 0.76mm/px · z∈[-354,+20]mm · 14 of 85 slices shown, 16 images]
[im 5/85  soft-tissue]
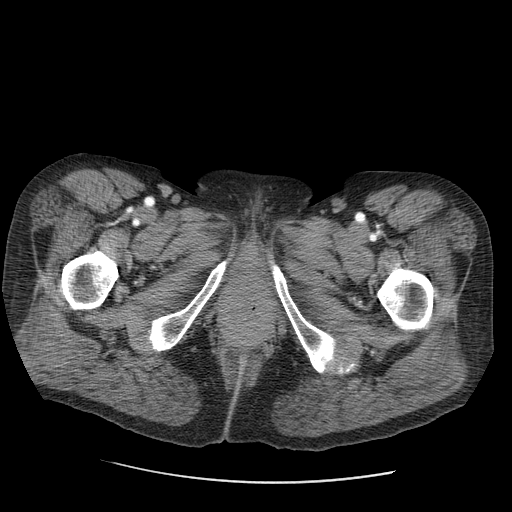
[im 5/85  bone]
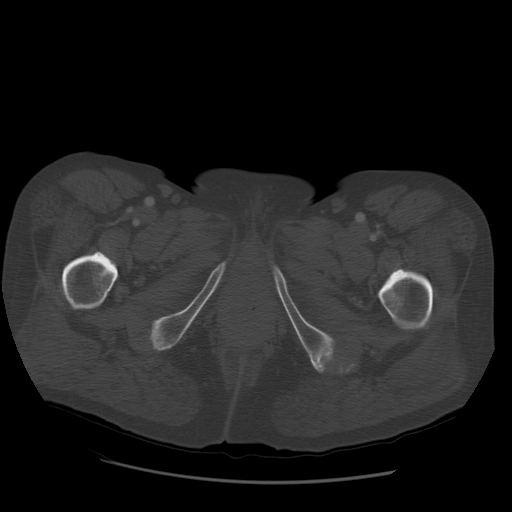
[im 10/85  soft-tissue]
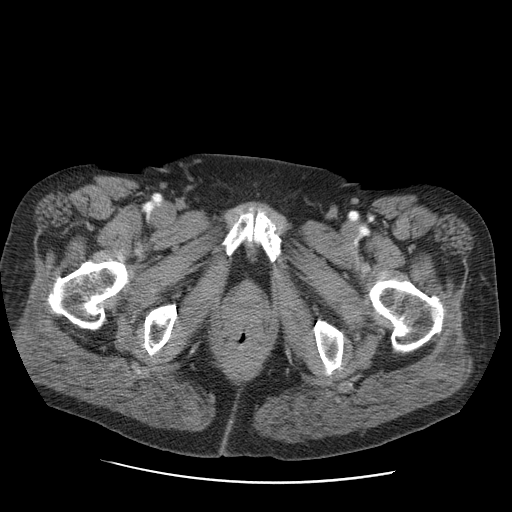
[im 19/85  soft-tissue]
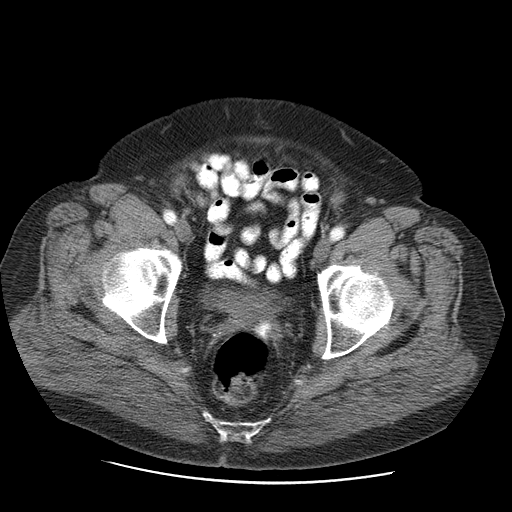
[im 24/85  soft-tissue]
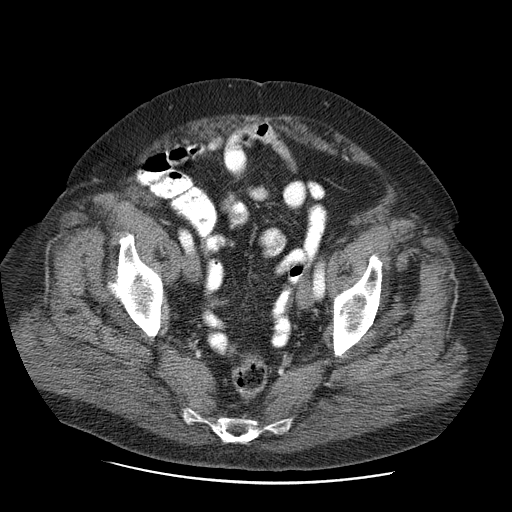
[im 29/85  soft-tissue]
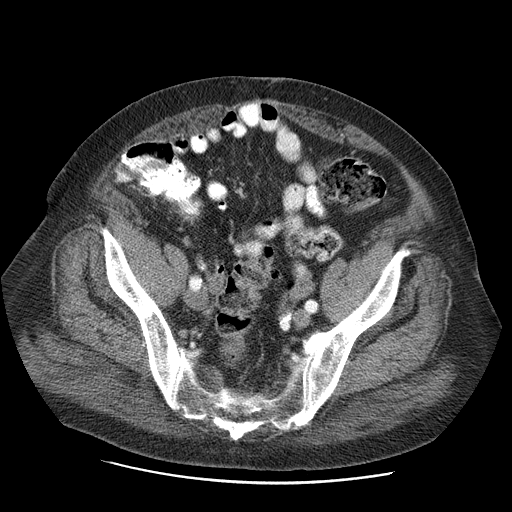
[im 33/85  soft-tissue]
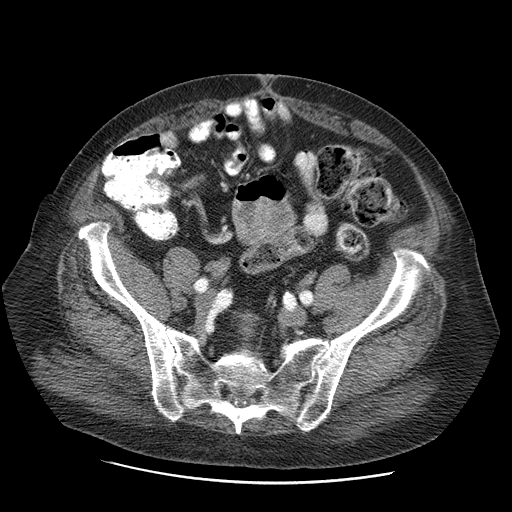
[im 38/85  soft-tissue]
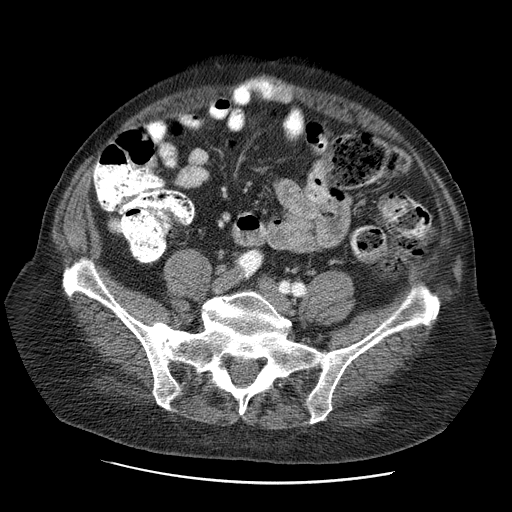
[im 47/85  soft-tissue]
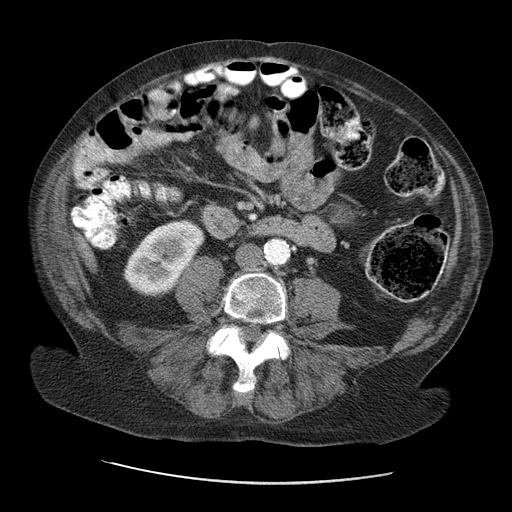
[im 52/85  soft-tissue]
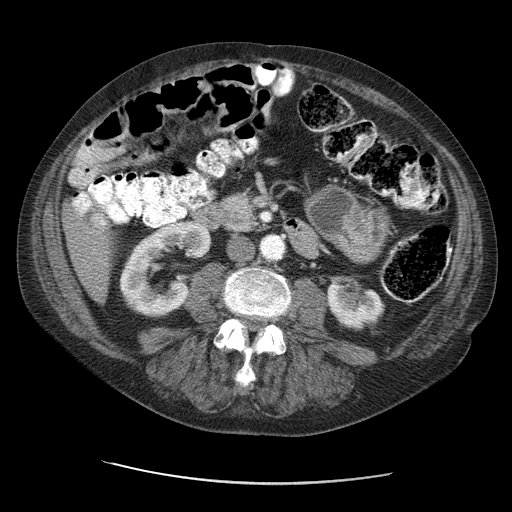
[im 52/85  bone]
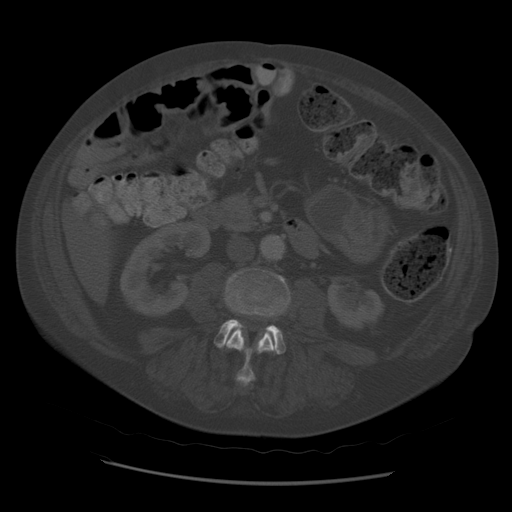
[im 57/85  soft-tissue]
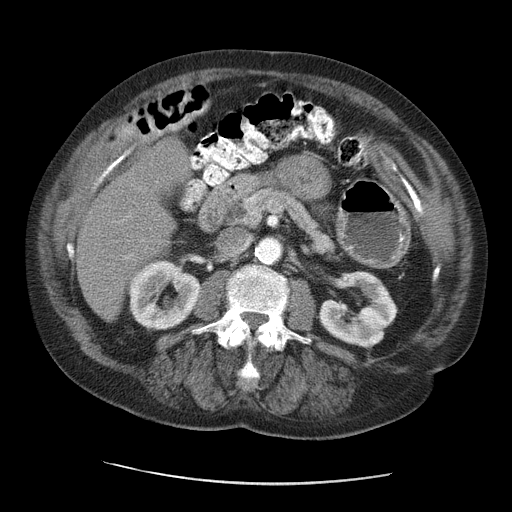
[im 61/85  soft-tissue]
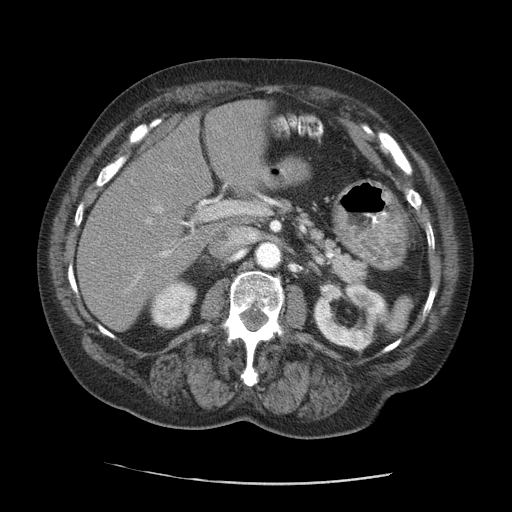
[im 66/85  soft-tissue]
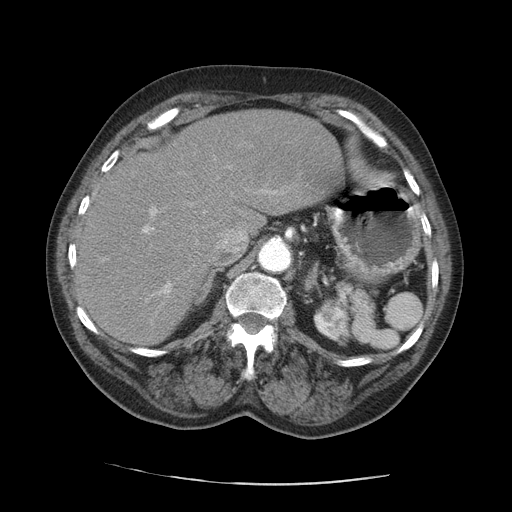
[im 75/85  soft-tissue]
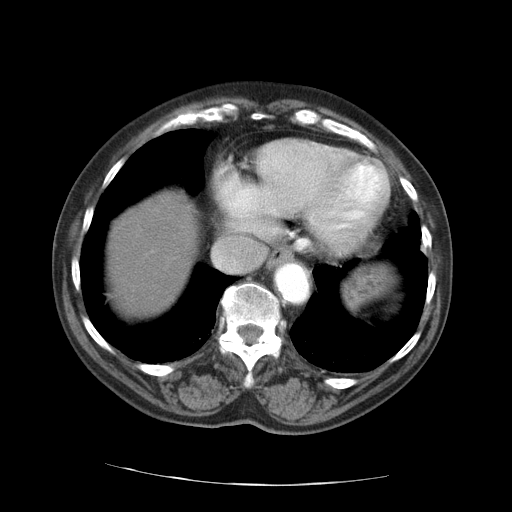
[im 80/85  soft-tissue]
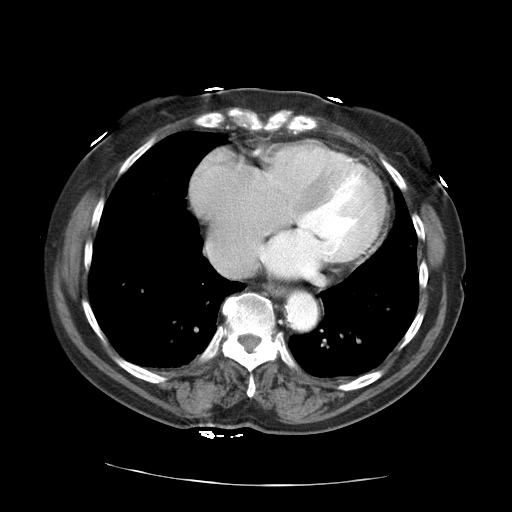

[Series 400: cor · coronal · 0.84mm/px · 3 of 168 slices shown]
[im 56/168  soft-tissue]
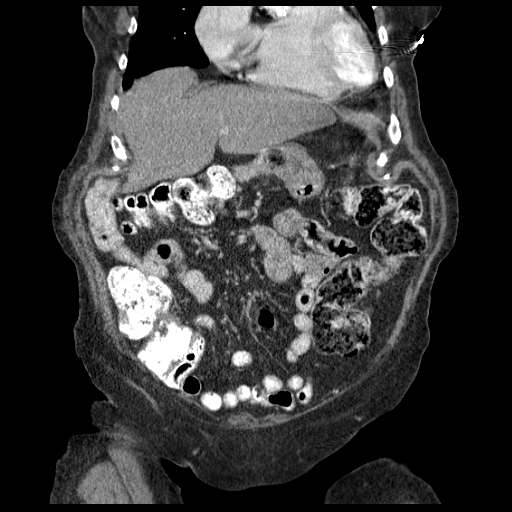
[im 75/168  soft-tissue]
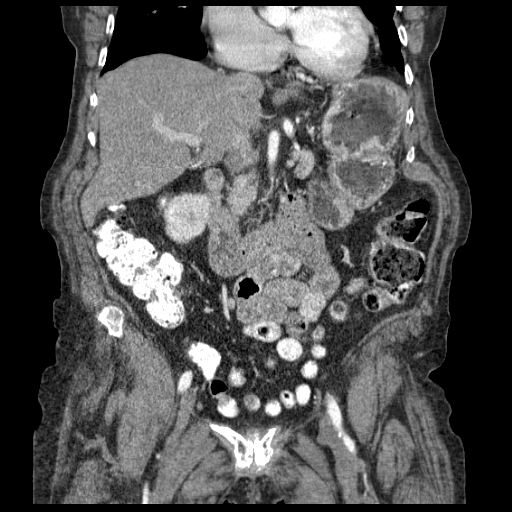
[im 93/168  soft-tissue]
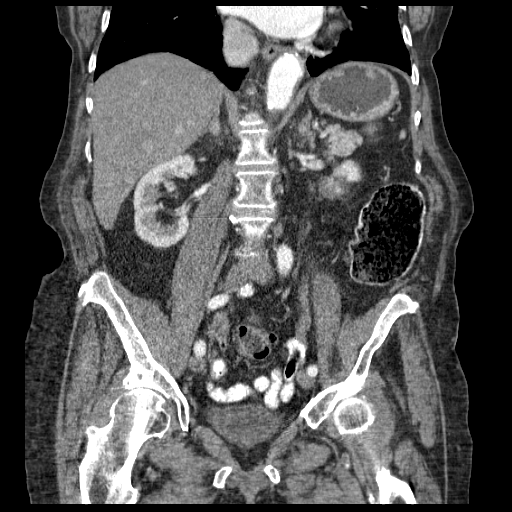

[17 of 46 positions shown; findings below may reference images not displayed]

FINDINGS: Mild bibasilar atelectasis is noted.  No pleural and pericardial
effusion.

The kidneys, pancreas, adrenal glands, and liver are unremarkable.
No intrahepatic biliary dilatation.  The common bile duct is
dilated to 9 mm.  The patient is status post cholecystectomy.  The
patient is status post splenectomy with splenosis again noted in
the upper left abdominal quadrant.

The gastric wall is thickened, possibly indicating gastritis.  Oral
contrast reaches the splenic flexure.  No complete small bowel
obstruction. Air fluid levels are identified in the jejunum with
prominent loops of bowel present, measuring up to 3 cm.

Atherosclerotic calcifications of the aorta are noted without
aneurysmal dilatation.

No acute osseous abnormalities.
IMPRESSION: 1. No complete bowel obstruction.  Mildly dilated loops of jejunum
with air fluid levels, which may indicate early partial
obstruction.

2. Dilated common bile duct in the setting of cholecystectomy.
Clinical correlation is recommended.

3. Thickened gastric wall, likely representing gastritis.

## 2015-01-04 ENCOUNTER — Other Ambulatory Visit: Payer: Self-pay | Admitting: Internal Medicine

## 2015-01-05 ENCOUNTER — Inpatient Hospital Stay (HOSPITAL_COMMUNITY): Admission: RE | Admit: 2015-01-05 | Payer: Medicare Other | Source: Ambulatory Visit

## 2015-01-05 ENCOUNTER — Ambulatory Visit: Payer: Medicare Other | Admitting: Pharmacist Clinician (PhC)/ Clinical Pharmacy Specialist

## 2015-01-07 ENCOUNTER — Other Ambulatory Visit: Payer: Self-pay | Admitting: *Deleted

## 2015-01-07 MED ORDER — GLIPIZIDE 10 MG PO TABS: ORAL_TABLET | ORAL | Status: DC

## 2015-01-07 NOTE — Telephone Encounter (Signed)
[facility] 

## 2015-01-12 ENCOUNTER — Ambulatory Visit: Payer: Medicare Other | Admitting: Pharmacist Clinician (PhC)/ Clinical Pharmacy Specialist

## 2015-01-12 ENCOUNTER — Inpatient Hospital Stay (HOSPITAL_COMMUNITY): Admission: RE | Admit: 2015-01-12 | Payer: Medicare Other | Source: Ambulatory Visit

## 2015-01-18 ENCOUNTER — Other Ambulatory Visit: Payer: Self-pay | Admitting: Internal Medicine

## 2015-01-18 NOTE — Telephone Encounter (Signed)
REFILL 

## 2015-01-19 ENCOUNTER — Ambulatory Visit (HOSPITAL_COMMUNITY)
Admission: RE | Admit: 2015-01-19 | Discharge: 2015-01-19 | Disposition: A | Discharge status: Home / Self Care | Payer: Medicare Other | Source: Ambulatory Visit | Attending: Internal Medicine | Admitting: Internal Medicine

## 2015-01-19 ENCOUNTER — Ambulatory Visit (INDEPENDENT_AMBULATORY_CARE_PROVIDER_SITE_OTHER): Payer: Medicare Other | Admitting: Pharmacist Clinician (PhC)/ Clinical Pharmacy Specialist

## 2015-01-19 DIAGNOSIS — I4891 Unspecified atrial fibrillation: Secondary | ICD-10-CM

## 2015-01-19 DIAGNOSIS — Z7901 Long term (current) use of anticoagulants: Secondary | ICD-10-CM

## 2015-01-19 DIAGNOSIS — I428 Other cardiomyopathies: Secondary | ICD-10-CM | POA: Insufficient documentation

## 2015-01-19 DIAGNOSIS — I429 Cardiomyopathy, unspecified: Secondary | ICD-10-CM | POA: Diagnosis not present

## 2015-01-19 DIAGNOSIS — I10 Essential (primary) hypertension: Secondary | ICD-10-CM | POA: Insufficient documentation

## 2015-01-19 LAB — POCT INR: INR: 2.6

## 2015-02-16 ENCOUNTER — Ambulatory Visit: Payer: Medicare Other | Admitting: Pharmacist Clinician (PhC)/ Clinical Pharmacy Specialist

## 2015-02-17 ENCOUNTER — Other Ambulatory Visit: Payer: Medicare Other

## 2015-02-17 DIAGNOSIS — E1121 Type 2 diabetes mellitus with diabetic nephropathy: Secondary | ICD-10-CM

## 2015-02-17 DIAGNOSIS — E034 Atrophy of thyroid (acquired): Secondary | ICD-10-CM

## 2015-02-17 DIAGNOSIS — I4891 Unspecified atrial fibrillation: Secondary | ICD-10-CM

## 2015-02-17 DIAGNOSIS — E038 Other specified hypothyroidism: Secondary | ICD-10-CM | POA: Diagnosis not present

## 2015-02-17 DIAGNOSIS — S0083XD Contusion of other part of head, subsequent encounter: Secondary | ICD-10-CM | POA: Diagnosis not present

## 2015-02-18 ENCOUNTER — Ambulatory Visit (INDEPENDENT_AMBULATORY_CARE_PROVIDER_SITE_OTHER): Payer: Medicare Other | Admitting: Pharmacist Clinician (PhC)/ Clinical Pharmacy Specialist

## 2015-02-18 DIAGNOSIS — Z7901 Long term (current) use of anticoagulants: Secondary | ICD-10-CM

## 2015-02-18 DIAGNOSIS — I4891 Unspecified atrial fibrillation: Secondary | ICD-10-CM | POA: Diagnosis not present

## 2015-02-18 LAB — HEMOGLOBIN A1C
Est. average glucose Bld gHb Est-mCnc: 120 mg/dL
Hgb A1c MFr Bld: 5.8 % — ABNORMAL HIGH (ref 4.8–5.6)

## 2015-02-18 LAB — CBC WITH DIFFERENTIAL/PLATELET
Basophils Absolute: 0 10*3/uL (ref 0.0–0.2)
Basos: 0 %
EOS (ABSOLUTE): 0 10*3/uL (ref 0.0–0.4)
Eos: 1 %
Hematocrit: 39.5 % (ref 34.0–46.6)
Hemoglobin: 12.5 g/dL (ref 11.1–15.9)
Immature Grans (Abs): 0 10*3/uL (ref 0.0–0.1)
Immature Granulocytes: 0 %
Lymphocytes Absolute: 1.2 10*3/uL (ref 0.7–3.1)
Lymphs: 20 %
MCH: 26.4 pg — ABNORMAL LOW (ref 26.6–33.0)
MCHC: 31.6 g/dL (ref 31.5–35.7)
MCV: 84 fL (ref 79–97)
Monocytes Absolute: 0.6 10*3/uL (ref 0.1–0.9)
Monocytes: 10 %
Neutrophils Absolute: 4.2 10*3/uL (ref 1.4–7.0)
Neutrophils: 69 %
Platelets: 326 10*3/uL (ref 150–379)
RBC: 4.73 x10E6/uL (ref 3.77–5.28)
RDW: 18.6 % — ABNORMAL HIGH (ref 12.3–15.4)
WBC: 6.1 10*3/uL (ref 3.4–10.8)

## 2015-02-18 LAB — BASIC METABOLIC PANEL
BUN/Creatinine Ratio: 10 — ABNORMAL LOW (ref 11–26)
BUN: 12 mg/dL (ref 8–27)
CO2: 26 mmol/L (ref 18–29)
Calcium: 9 mg/dL (ref 8.7–10.3)
Chloride: 100 mmol/L (ref 97–108)
Creatinine, Ser: 1.23 mg/dL — ABNORMAL HIGH (ref 0.57–1.00)
GFR calc Af Amer: 48 mL/min/{1.73_m2} — ABNORMAL LOW (ref >59)
GFR calc non Af Amer: 41 mL/min/{1.73_m2} — ABNORMAL LOW (ref >59)
Glucose: 54 mg/dL — ABNORMAL LOW (ref 65–99)
Potassium: 3.7 mmol/L (ref 3.5–5.2)
Sodium: 145 mmol/L — ABNORMAL HIGH (ref 134–144)

## 2015-02-18 LAB — LIPID PANEL
Chol/HDL Ratio: 2.5 ratio units (ref 0.0–4.4)
Cholesterol, Total: 176 mg/dL (ref 100–199)
HDL: 70 mg/dL (ref >39)
LDL Calculated: 92 mg/dL (ref 0–99)
Triglycerides: 69 mg/dL (ref 0–149)
VLDL Cholesterol Cal: 14 mg/dL (ref 5–40)

## 2015-02-18 LAB — POCT INR: INR: 2.3

## 2015-02-18 LAB — TSH: TSH: 2.2 u[IU]/mL (ref 0.450–4.500)

## 2015-02-22 ENCOUNTER — Other Ambulatory Visit: Payer: Self-pay | Admitting: Internal Medicine

## 2015-02-24 ENCOUNTER — Encounter: Payer: Self-pay | Admitting: Internal Medicine

## 2015-02-24 ENCOUNTER — Ambulatory Visit (INDEPENDENT_AMBULATORY_CARE_PROVIDER_SITE_OTHER): Payer: Medicare Other | Admitting: Internal Medicine

## 2015-02-24 ENCOUNTER — Other Ambulatory Visit: Payer: Self-pay | Admitting: *Deleted

## 2015-02-24 VITALS — BP 130/80 | HR 77 | Temp 98.5°F | Ht 69.0 in | Wt 175.0 lb

## 2015-02-24 DIAGNOSIS — K5909 Other constipation: Secondary | ICD-10-CM

## 2015-02-24 DIAGNOSIS — E034 Atrophy of thyroid (acquired): Secondary | ICD-10-CM

## 2015-02-24 DIAGNOSIS — Z780 Asymptomatic menopausal state: Secondary | ICD-10-CM | POA: Diagnosis not present

## 2015-02-24 DIAGNOSIS — E038 Other specified hypothyroidism: Secondary | ICD-10-CM | POA: Diagnosis not present

## 2015-02-24 DIAGNOSIS — K5900 Constipation, unspecified: Secondary | ICD-10-CM

## 2015-02-24 DIAGNOSIS — I872 Venous insufficiency (chronic) (peripheral): Secondary | ICD-10-CM

## 2015-02-24 DIAGNOSIS — E1121 Type 2 diabetes mellitus with diabetic nephropathy: Secondary | ICD-10-CM

## 2015-02-24 DIAGNOSIS — I4891 Unspecified atrial fibrillation: Secondary | ICD-10-CM | POA: Diagnosis not present

## 2015-02-24 NOTE — Progress Notes (Signed)
Patient ID: Nicole [name], female   DOB: 13-Dec-1930, 81 y.o.   MRN: [id number]   Location:  [facility] / [facility]  Code Status: DNR Goals of Care: Advanced Directive information Does patient have an advance directive?: No, Would patient like information on creating an advanced directive?: Yes - Educational materials given (last time, but she has not yet completed it.)  Chief Complaint  Patient presents with  . Medical Management of Chronic Issues    4 month follow-up, discuss labs (copy printed)     HPI: Patient is a 81 y.o. black female seen in the office today for medical mgt of her chronic conditions.    Doing ok.  Says she's now gotten used to the intermittent constipation. Dr. Cristina Gong did not need to do another cscope at present.     No new concerns.  Says she's in good shape for the shape she's in.    DMII:  Foot exam done.  Tolerating metformin and glipizide  Fall Risk  02/24/2015 10/22/2014 06/10/2014 02/08/2014 04/14/2013  Falls in the past year? Yes Yes No No No  Number falls in past yr: 1 2 or more - - -  Injury with Fall? Yes Yes - - -   Sometimes gets tired more easily.  Discussed trying to check her pulse when she feels this way.  Should not be using benadryl in daytime or really at all in her age group.  Last TSH was normal in July on synthroid.  Review of Systems:  Review of Systems  Constitutional: Positive for malaise/fatigue. Negative for fever and chills.  HENT: Negative for hearing loss.   Eyes: Negative for blurred vision.  Respiratory: Negative for shortness of breath.   Cardiovascular: Negative for chest pain and leg swelling.  Gastrointestinal: Positive for constipation. Negative for abdominal pain, blood in stool and melena.  Genitourinary: Negative for dysuria, urgency and frequency.  Musculoskeletal: Negative for falls.  Skin: Negative for itching and rash.  Neurological: Negative for dizziness and weakness.    Endo/Heme/Allergies: Bruises/bleeds easily.  Psychiatric/Behavioral: Negative for memory loss.    Past Medical History  Diagnosis Date  . Colon cancer 07/1997  . Hypertension   . Diabetes mellitus   . CHF (congestive heart failure)   . AAA (abdominal aortic aneurysm)   . Atrial fibrillation   . Hypercholesteremia   . Kidney stone     renal calculi  . Small bowel obstruction due to adhesions 05/16/2012  . Hypothyroidism   . Arthritis   . Anemia   . Cholelithiasis   . Perforation of bile duct   . Hemorrhage of rectum and anus   . Swelling, mass, or lump in head and neck   . Cervicalgia   . Hypoglycemia, unspecified   . Pain in joint, lower leg   . Anxiety   . Shortness of breath   . Acute upper respiratory infections of unspecified site   . Depression   . Thoracic aneurysm without mention of rupture   . GERD (gastroesophageal reflux disease)   . Proteinuria   . Other specified cardiac dysrhythmias(427.89)   . Congestive heart failure, unspecified   . Electrolyte and fluid disorders not elsewhere classified   . Other chronic allergic conjunctivitis   . Hypopotassemia   . Chronic systolic heart failure   . Dizziness and giddiness   . Malignant neoplasm of colon, unspecified site   . Other and unspecified hyperlipidemia   . Gout, unspecified   . Atrial  fibrillation   . Aortic aneurysm of unspecified site without mention of rupture   . Pain in joint, site unspecified   . Palpitations   . CKD (chronic kidney disease)   . Diabetes mellitus     Past Surgical History  Procedure Laterality Date  . Hemicolectomy  08/11/1997  . Kidney stone surgery    . Colon surgery      to remove colon ca  . Cholecystectomy  05/14/2012    Procedure: LAPAROSCOPIC CHOLECYSTECTOMY WITH INTRAOPERATIVE CHOLANGIOGRAM;  Surgeon: Haywood Lasso, MD;  Location: [facility];  Service: General;  Laterality: N/A;  . Ercp  05/17/2012    Procedure: ENDOSCOPIC RETROGRADE CHOLANGIOPANCREATOGRAPHY  (ERCP);  Surgeon: Missy Sabins, MD;  Location: [place];  Service: Gastroenterology;  Laterality: N/A;  . Ercp  08/12/2012    Procedure: ENDOSCOPIC RETROGRADE CHOLANGIOPANCREATOGRAPHY (ERCP);  Surgeon: Missy Sabins, MD;  Location: [facility] ENDOSCOPY;  Service: Endoscopy;  Laterality: N/A;  . Transthoracic echocardiogram  11/2009    EF 45-50%, mild-mod AV regurg; mild MR; mod TR; ascending aorta mildly dilated  . Cardiac catheterization  2008    moderate severe pulm HTN; with elevted pulm capillary wedge pressure     Allergies  Allergen Reactions  . Iohexol      Code: VOM, Desc: PT REPORTS VOMITING W/ IVP DYE- ARS 12/26/08, Onset Date: [id number]   . Z-Pak [Azithromycin]   . Iodinated Diagnostic Agents Nausea Only    PT HAS HAD SINGULAR INCIDENT OF NAUSEA  W/ IV CONTYRAST INJECTION, NONE IF INJECTED SLOWLY//A.CALHOUN   Medications: Patient's Medications  New Prescriptions   No medications on file  Previous Medications   ALLOPURINOL (ZYLOPRIM) 100 MG TABLET    Take 1 tablet (100 mg total) by mouth daily.   BACITRACIN OINTMENT    Apply 1 application topically 2 (two) times daily.   DILTIAZEM (CARDIZEM CD) 240 MG 24 HR CAPSULE    take 1 capsule by mouth once daily   DIPHENHYDRAMINE (BENADRYL) 25 MG CAPSULE    Take 25 mg by mouth every 6 (six) hours as needed for itching.   FUROSEMIDE (LASIX) 40 MG TABLET    take 1 tablet by mouth twice a day   GLIPIZIDE (GLUCOTROL) 10 MG TABLET    Take one tablet by mouth once daily to control blood sugar   LANCET DEVICES (ADJUSTABLE LANCING DEVICE) MISC    Check blood sugar once daily as directed   LEVOTHYROXINE (SYNTHROID, LEVOTHROID) 50 MCG TABLET    Take one tablet by mouth 30 minutes before breakfast once daily for thyroid   METFORMIN (GLUCOPHAGE) 500 MG TABLET    Take 1 tablet (500 mg total) by mouth 2 (two) times daily with a meal.   METOPROLOL (LOPRESSOR) 25 MG TABLET    Take 1 tablet (25 mg total) by mouth 2 (two) times daily.   ONE TOUCH ULTRA TEST TEST STRIP     Check blood sugar once daily as directed   POLYETHYLENE GLYCOL (MIRALAX / GLYCOLAX) PACKET    Take 17 g by mouth daily as needed for mild constipation.    POTASSIUM CHLORIDE SA (K-DUR,KLOR-CON) 20 MEQ TABLET    take 1 tablet by mouth once daily   SIMVASTATIN (ZOCOR) 40 MG TABLET    Take one tablet by mouth once daily for cholesterol   WARFARIN (COUMADIN) 5 MG TABLET    Take 2.5-5 mg by mouth See admin instructions. Take 1 tablet Monday ,Wednesday and Friday.  Take 0.5 tablet on Tuesday, Thursday, Saturday  and Sunday.   ZOSTER VACCINE LIVE, PF, (ZOSTAVAX) 58527 UNT/0.65ML INJECTION    Inject 0.65 mLs into the skin once.  Modified Medications   Modified Medication Previous Medication   WARFARIN (COUMADIN) 5 MG TABLET warfarin (COUMADIN) 5 MG tablet      take 1 tablet by mouth once daily or as directed    Take 1 tablet by mouth daily or as directed by coumadin clinic  Discontinued Medications   HYDROCODONE-ACETAMINOPHEN (NORCO/VICODIN) 5-325 MG PER TABLET    Take 1 tablet by mouth every 6 (six) hours as needed.   INDOMETHACIN (INDOCIN) 25 MG CAPSULE    Take 1 capsule (25 mg total) by mouth 3 (three) times daily as needed.   MAGNESIUM OXIDE (MAG-OX) 400 (241.3 MG) MG TABLET    Take 400 mg by mouth 2 (two) times daily.    Physical Exam: Filed Vitals:   02/24/15 1012  BP: 130/80  Pulse: 77  Temp: 98.5 F (36.9 C)  TempSrc: Oral  Height: 5\' 9"  (1.753 m)  Weight: 175 lb (79.379 kg)  SpO2: 96%   Physical Exam  Constitutional: She is oriented to person, place, and time. She appears well-developed and well-nourished. No distress.  Cardiovascular: Intact distal pulses.   irreg irreg  Pulmonary/Chest: Effort normal and breath sounds normal. No respiratory distress.  Abdominal: Soft. Bowel sounds are normal. She exhibits distension. She exhibits no mass. There is no tenderness. There is no rebound and no guarding. No hernia.  Musculoskeletal: Normal range of motion.  Neurological: She is  alert and oriented to person, place, and time.  Psychiatric:  Affect seems flat today--not her bubbly self (was going to a funeral)    Labs reviewed: Basic Metabolic Panel:  Recent Labs  06/07/14 0916 10/04/14 0922 10/12/14 0819 02/17/15 0810  NA 143 141 143 145*  K 3.5 3.4* 4.0 3.7  CL 101 102 100 100  CO2 24 26 24 26   GLUCOSE 123* 127* 122* 54*  BUN 10 11 16 12   CREATININE 1.13* 1.37* 1.25* 1.23*  CALCIUM 9.2 9.2 9.0 9.0  TSH 3.500  --   --  2.200   Liver Function Tests:  Recent Labs  04/02/14 2233 04/03/14 0549 10/04/14 0922  AST 16 25 23   ALT 12 13 13   ALKPHOS 109 108 107  BILITOT 0.5 0.6 0.9  PROT 7.6 6.8 7.7  ALBUMIN 3.8 3.4* 3.7    Recent Labs  04/02/14 2233  LIPASE 47   No results for input(s): AMMONIA in the last 8760 hours. CBC:  Recent Labs  04/07/14 0249 06/07/14 0916 10/04/14 0922 10/12/14 0819 02/17/15 0810  WBC 7.8 7.2 6.8 7.1 6.1  NEUTROABS  --  4.1 4.4 4.5 4.2  HGB 11.5* 11.9 13.0 11.6  --   HCT 33.1* 36.6 38.2 36.1 39.5  MCV 79.2 79 77.3* 80  --   PLT 325 325 288  --   --    Lipid Panel:  Recent Labs  06/07/14 0916 02/17/15 0810  CHOL 169 176  HDL 62 70  LDLCALC 88 92  TRIG 95 69  CHOLHDL 2.7 2.5   Lab Results  Component Value Date   HGBA1C 5.8* 02/17/2015    Assessment/Plan 1. Atrial fibrillation with RVR - suspect she may be having episodes of tachycardia/RVR that are causing her fatigue -Recommended she check he pulse to see when she feels weaker and report this to me or her cardiologist - CBC with Differential/Platelet; Future  2. Hypothyroidism due to acquired atrophy of  thyroid -TSH wnl last month, cont current synthroid - CBC with Differential/Platelet; Future  3. Chronic constipation -intermittent; ongoing since her colon cancer surgery and subsequent obstruction from adhesions -is coping with this ok by adjusting intake and fluids - CBC with Differential/Platelet; Future  4. Type 2 diabetes  mellitus with diabetic nephropathy - cont metformin and glipizide Lab Results  Component Value Date   HGBA1C 5.8* 02/17/2015  -well controlled w/o hypoglycemia -needs urine microalbumin--not on ace/arb and allergy not in chart - CBC with Differential/Platelet; Future - Hemoglobin A1c; Future  5. Postmenopausal estrogen deficiency -due for bone density--is not even taking vitamin D at this time - DG Bone Density; Future - CBC with Differential/Platelet; Future  6. 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 DG Bone Density    Standing Status: Future     Number of Occurrences:      Standing Expiration Date: 04/25/2016    Order Specific Question:  Reason for Exam (SYMPTOM  OR DIAGNOSIS REQUIRED)    Answer:  postmenopausal estrogen deficiency    Order Specific Question:  Preferred imaging location?    Answer:  [facility]  . CBC with Differential/Platelet    Standing Status: Future     Number of Occurrences:      Standing Expiration Date: 10/25/2015  . Hemoglobin A1c    Standing Status: Future     Number of Occurrences:      Standing Expiration Date: 10/25/2015  zoster Rx given  Next appt: 4 mos for med mgt  Mattthew Ziomek L. Darrio Bade, D.O. [facility] [address], [place] [address] Cell Phone (Mon-Fri 8am-5pm):  [phone] On Call:  [phone] & follow prompts after 5pm & weekends Office Phone:  [phone] Office Fax:  [phone]

## 2015-03-01 ENCOUNTER — Telehealth: Payer: Self-pay | Admitting: *Deleted

## 2015-03-01 NOTE — Telephone Encounter (Signed)
Filled out Certificate of Medical Necessity for Diabetic Testing Supplies and given to Dr. Mariea Clonts to review and sign and to fax back to [facility] Fax: [phone]

## 2015-03-11 ENCOUNTER — Other Ambulatory Visit: Payer: Self-pay | Admitting: *Deleted

## 2015-03-11 MED ORDER — METFORMIN HCL 500 MG PO TABS: 500.0000 mg | ORAL_TABLET | Freq: Two times a day (BID) | ORAL | Status: DC

## 2015-03-11 MED ORDER — POTASSIUM CHLORIDE CRYS ER 20 MEQ PO TBCR: EXTENDED_RELEASE_TABLET | ORAL | Status: DC

## 2015-03-11 NOTE — Telephone Encounter (Signed)
Patient requested to be faxed to pharmacy 

## 2015-03-15 DIAGNOSIS — M810 Age-related osteoporosis without current pathological fracture: Secondary | ICD-10-CM | POA: Diagnosis not present

## 2015-03-15 LAB — HM DEXA SCAN

## 2015-03-18 ENCOUNTER — Telehealth: Payer: Self-pay

## 2015-03-18 NOTE — Telephone Encounter (Signed)
Report sent to scanning. Per Dr.[name]: recommend Vit D3 2000 units daily and increase intake of milk products. Impression: Osteopenia (low bone mass)  Patient aware of results

## 2015-04-01 ENCOUNTER — Ambulatory Visit: Payer: Medicare Other | Admitting: Pharmacist Clinician (PhC)/ Clinical Pharmacy Specialist

## 2015-04-04 ENCOUNTER — Emergency Department (HOSPITAL_COMMUNITY): Payer: Medicare Other

## 2015-04-04 ENCOUNTER — Encounter (HOSPITAL_COMMUNITY): Payer: Self-pay | Admitting: *Deleted

## 2015-04-04 ENCOUNTER — Inpatient Hospital Stay (HOSPITAL_COMMUNITY)
Admission: EM | Admit: 2015-04-04 | Discharge: 2015-04-12 | DRG: 064 | Disposition: A | Discharge status: Rehabilitation Facility | Payer: Medicare Other | Attending: Neurology | Admitting: Neurology

## 2015-04-04 DIAGNOSIS — E1142 Type 2 diabetes mellitus with diabetic polyneuropathy: Secondary | ICD-10-CM | POA: Diagnosis present

## 2015-04-04 DIAGNOSIS — N183 Chronic kidney disease, stage 3 (moderate): Secondary | ICD-10-CM | POA: Diagnosis present

## 2015-04-04 DIAGNOSIS — M109 Gout, unspecified: Secondary | ICD-10-CM | POA: Diagnosis present

## 2015-04-04 DIAGNOSIS — Z833 Family history of diabetes mellitus: Secondary | ICD-10-CM

## 2015-04-04 DIAGNOSIS — H53462 Homonymous bilateral field defects, left side: Secondary | ICD-10-CM | POA: Diagnosis not present

## 2015-04-04 DIAGNOSIS — Z8249 Family history of ischemic heart disease and other diseases of the circulatory system: Secondary | ICD-10-CM

## 2015-04-04 DIAGNOSIS — Z888 Allergy status to other drugs, medicaments and biological substances status: Secondary | ICD-10-CM

## 2015-04-04 DIAGNOSIS — I10 Essential (primary) hypertension: Secondary | ICD-10-CM

## 2015-04-04 DIAGNOSIS — E039 Hypothyroidism, unspecified: Secondary | ICD-10-CM | POA: Diagnosis not present

## 2015-04-04 DIAGNOSIS — Z881 Allergy status to other antibiotic agents status: Secondary | ICD-10-CM

## 2015-04-04 DIAGNOSIS — Z7901 Long term (current) use of anticoagulants: Secondary | ICD-10-CM | POA: Diagnosis not present

## 2015-04-04 DIAGNOSIS — F329 Major depressive disorder, single episode, unspecified: Secondary | ICD-10-CM | POA: Diagnosis present

## 2015-04-04 DIAGNOSIS — T45515A Adverse effect of anticoagulants, initial encounter: Secondary | ICD-10-CM | POA: Diagnosis present

## 2015-04-04 DIAGNOSIS — I714 Abdominal aortic aneurysm, without rupture: Secondary | ICD-10-CM | POA: Diagnosis present

## 2015-04-04 DIAGNOSIS — S0093XA Contusion of unspecified part of head, initial encounter: Secondary | ICD-10-CM | POA: Diagnosis not present

## 2015-04-04 DIAGNOSIS — E876 Hypokalemia: Secondary | ICD-10-CM | POA: Diagnosis not present

## 2015-04-04 DIAGNOSIS — R42 Dizziness and giddiness: Secondary | ICD-10-CM | POA: Diagnosis not present

## 2015-04-04 DIAGNOSIS — F419 Anxiety disorder, unspecified: Secondary | ICD-10-CM | POA: Diagnosis not present

## 2015-04-04 DIAGNOSIS — D6832 Hemorrhagic disorder due to extrinsic circulating anticoagulants: Secondary | ICD-10-CM | POA: Diagnosis not present

## 2015-04-04 DIAGNOSIS — G8194 Hemiplegia, unspecified affecting left nondominant side: Secondary | ICD-10-CM | POA: Diagnosis not present

## 2015-04-04 DIAGNOSIS — I610 Nontraumatic intracerebral hemorrhage in hemisphere, subcortical: Secondary | ICD-10-CM | POA: Diagnosis not present

## 2015-04-04 DIAGNOSIS — R059 Cough, unspecified: Secondary | ICD-10-CM

## 2015-04-04 DIAGNOSIS — K219 Gastro-esophageal reflux disease without esophagitis: Secondary | ICD-10-CM | POA: Diagnosis not present

## 2015-04-04 DIAGNOSIS — R05 Cough: Secondary | ICD-10-CM

## 2015-04-04 DIAGNOSIS — Z85038 Personal history of other malignant neoplasm of large intestine: Secondary | ICD-10-CM | POA: Diagnosis not present

## 2015-04-04 DIAGNOSIS — Z0000 Encounter for general adult medical examination without abnormal findings: Secondary | ICD-10-CM

## 2015-04-04 DIAGNOSIS — I639 Cerebral infarction, unspecified: Secondary | ICD-10-CM

## 2015-04-04 DIAGNOSIS — D699 Hemorrhagic condition, unspecified: Secondary | ICD-10-CM | POA: Diagnosis not present

## 2015-04-04 DIAGNOSIS — R404 Transient alteration of awareness: Secondary | ICD-10-CM | POA: Diagnosis not present

## 2015-04-04 DIAGNOSIS — I481 Persistent atrial fibrillation: Secondary | ICD-10-CM | POA: Diagnosis not present

## 2015-04-04 DIAGNOSIS — I160 Hypertensive urgency: Secondary | ICD-10-CM | POA: Insufficient documentation

## 2015-04-04 DIAGNOSIS — E1140 Type 2 diabetes mellitus with diabetic neuropathy, unspecified: Secondary | ICD-10-CM | POA: Diagnosis not present

## 2015-04-04 DIAGNOSIS — G911 Obstructive hydrocephalus: Secondary | ICD-10-CM | POA: Diagnosis present

## 2015-04-04 DIAGNOSIS — N189 Chronic kidney disease, unspecified: Secondary | ICD-10-CM | POA: Diagnosis not present

## 2015-04-04 DIAGNOSIS — Z91041 Radiographic dye allergy status: Secondary | ICD-10-CM

## 2015-04-04 DIAGNOSIS — I629 Nontraumatic intracranial hemorrhage, unspecified: Secondary | ICD-10-CM | POA: Diagnosis not present

## 2015-04-04 DIAGNOSIS — Z5181 Encounter for therapeutic drug level monitoring: Secondary | ICD-10-CM

## 2015-04-04 DIAGNOSIS — I482 Chronic atrial fibrillation, unspecified: Secondary | ICD-10-CM | POA: Insufficient documentation

## 2015-04-04 DIAGNOSIS — I4891 Unspecified atrial fibrillation: Secondary | ICD-10-CM | POA: Diagnosis not present

## 2015-04-04 DIAGNOSIS — E11649 Type 2 diabetes mellitus with hypoglycemia without coma: Secondary | ICD-10-CM | POA: Diagnosis not present

## 2015-04-04 DIAGNOSIS — E1159 Type 2 diabetes mellitus with other circulatory complications: Secondary | ICD-10-CM | POA: Diagnosis not present

## 2015-04-04 DIAGNOSIS — J811 Chronic pulmonary edema: Secondary | ICD-10-CM | POA: Diagnosis not present

## 2015-04-04 DIAGNOSIS — R4189 Other symptoms and signs involving cognitive functions and awareness: Secondary | ICD-10-CM | POA: Diagnosis not present

## 2015-04-04 DIAGNOSIS — E1122 Type 2 diabetes mellitus with diabetic chronic kidney disease: Secondary | ICD-10-CM | POA: Diagnosis present

## 2015-04-04 DIAGNOSIS — Z4659 Encounter for fitting and adjustment of other gastrointestinal appliance and device: Secondary | ICD-10-CM

## 2015-04-04 DIAGNOSIS — I615 Nontraumatic intracerebral hemorrhage, intraventricular: Secondary | ICD-10-CM | POA: Diagnosis not present

## 2015-04-04 DIAGNOSIS — G936 Cerebral edema: Secondary | ICD-10-CM | POA: Diagnosis present

## 2015-04-04 DIAGNOSIS — I129 Hypertensive chronic kidney disease with stage 1 through stage 4 chronic kidney disease, or unspecified chronic kidney disease: Secondary | ICD-10-CM | POA: Diagnosis not present

## 2015-04-04 DIAGNOSIS — D649 Anemia, unspecified: Secondary | ICD-10-CM | POA: Diagnosis not present

## 2015-04-04 DIAGNOSIS — I5022 Chronic systolic (congestive) heart failure: Secondary | ICD-10-CM | POA: Diagnosis not present

## 2015-04-04 DIAGNOSIS — Z4682 Encounter for fitting and adjustment of non-vascular catheter: Secondary | ICD-10-CM | POA: Diagnosis not present

## 2015-04-04 DIAGNOSIS — I5032 Chronic diastolic (congestive) heart failure: Secondary | ICD-10-CM | POA: Diagnosis not present

## 2015-04-04 DIAGNOSIS — Z79899 Other long term (current) drug therapy: Secondary | ICD-10-CM

## 2015-04-04 DIAGNOSIS — E871 Hypo-osmolality and hyponatremia: Secondary | ICD-10-CM | POA: Diagnosis not present

## 2015-04-04 DIAGNOSIS — E785 Hyperlipidemia, unspecified: Secondary | ICD-10-CM | POA: Diagnosis not present

## 2015-04-04 DIAGNOSIS — I619 Nontraumatic intracerebral hemorrhage, unspecified: Secondary | ICD-10-CM | POA: Diagnosis not present

## 2015-04-04 DIAGNOSIS — G4720 Circadian rhythm sleep disorder, unspecified type: Secondary | ICD-10-CM | POA: Diagnosis not present

## 2015-04-04 DIAGNOSIS — G8190 Hemiplegia, unspecified affecting unspecified side: Secondary | ICD-10-CM | POA: Diagnosis not present

## 2015-04-04 DIAGNOSIS — R32 Unspecified urinary incontinence: Secondary | ICD-10-CM | POA: Diagnosis not present

## 2015-04-04 LAB — CBC WITH DIFFERENTIAL/PLATELET
BASOS ABS: 0 10*3/uL (ref 0.0–0.1)
BASOS PCT: 0 % (ref 0–1)
Eosinophils Absolute: 0 10*3/uL (ref 0.0–0.7)
Eosinophils Relative: 0 % (ref 0–5)
HEMATOCRIT: 37.4 % (ref 36.0–46.0)
Hemoglobin: 12.8 g/dL (ref 12.0–15.0)
Lymphocytes Relative: 9 % — ABNORMAL LOW (ref 12–46)
Lymphs Abs: 0.6 10*3/uL — ABNORMAL LOW (ref 0.7–4.0)
MCH: 26.4 pg (ref 26.0–34.0)
MCHC: 34.2 g/dL (ref 30.0–36.0)
MCV: 77.1 fL — ABNORMAL LOW (ref 78.0–100.0)
MONO ABS: 0.3 10*3/uL (ref 0.1–1.0)
Monocytes Relative: 5 % (ref 3–12)
NEUTROS ABS: 6.1 10*3/uL (ref 1.7–7.7)
Neutrophils Relative %: 86 % — ABNORMAL HIGH (ref 43–77)
PLATELETS: 329 10*3/uL (ref 150–400)
RBC: 4.85 MIL/uL (ref 3.87–5.11)
RDW: 17.2 % — AB (ref 11.5–15.5)
WBC: 7.1 10*3/uL (ref 4.0–10.5)

## 2015-04-04 LAB — PROTIME-INR
INR: 3.06 — ABNORMAL HIGH (ref 0.00–1.49)
INR: 1.4 (ref 0.00–1.49)
INR: 1.38 (ref 0.00–1.49)
PROTHROMBIN TIME: 31 s — AB (ref 11.6–15.2)
PROTHROMBIN TIME: 17.3 s — AB (ref 11.6–15.2)
Prothrombin Time: 17.1 seconds — ABNORMAL HIGH (ref 11.6–15.2)

## 2015-04-04 LAB — COMPREHENSIVE METABOLIC PANEL
ALBUMIN: 3.6 g/dL (ref 3.5–5.0)
ALT: 10 U/L — ABNORMAL LOW (ref 14–54)
AST: 22 U/L (ref 15–41)
Alkaline Phosphatase: 91 U/L (ref 38–126)
Anion gap: 12 (ref 5–15)
BUN: 14 mg/dL (ref 6–20)
CHLORIDE: 106 mmol/L (ref 101–111)
CO2: 24 mmol/L (ref 22–32)
Calcium: 9.1 mg/dL (ref 8.9–10.3)
Creatinine, Ser: 1.35 mg/dL — ABNORMAL HIGH (ref 0.44–1.00)
GFR calc Af Amer: 41 mL/min — ABNORMAL LOW (ref >60)
GFR calc non Af Amer: 35 mL/min — ABNORMAL LOW (ref >60)
GLUCOSE: 198 mg/dL — AB (ref 65–99)
POTASSIUM: 3.8 mmol/L (ref 3.5–5.1)
Sodium: 142 mmol/L (ref 135–145)
Total Bilirubin: 1 mg/dL (ref 0.3–1.2)
Total Protein: 7.8 g/dL (ref 6.5–8.1)

## 2015-04-04 LAB — URINALYSIS, ROUTINE W REFLEX MICROSCOPIC
Bilirubin Urine: NEGATIVE
Glucose, UA: 100 mg/dL — AB
Ketones, ur: 15 mg/dL — AB
LEUKOCYTES UA: NEGATIVE
NITRITE: NEGATIVE
Protein, ur: >300 mg/dL — AB
SPECIFIC GRAVITY, URINE: 1.015 (ref 1.005–1.030)
UROBILINOGEN UA: 1 mg/dL (ref 0.0–1.0)
pH: 7 (ref 5.0–8.0)

## 2015-04-04 LAB — MRSA PCR SCREENING: MRSA by PCR: NEGATIVE

## 2015-04-04 LAB — TYPE AND SCREEN
ABO/RH(D): A POS
ANTIBODY SCREEN: NEGATIVE

## 2015-04-04 LAB — I-STAT TROPONIN, ED: Troponin i, poc: 0.02 ng/mL (ref 0.00–0.08)

## 2015-04-04 LAB — APTT: aPTT: 48 seconds — ABNORMAL HIGH (ref 24–37)

## 2015-04-04 LAB — GLUCOSE, CAPILLARY
Glucose-Capillary: 154 mg/dL — ABNORMAL HIGH (ref 65–99)
Glucose-Capillary: 140 mg/dL — ABNORMAL HIGH (ref 65–99)

## 2015-04-04 LAB — BRAIN NATRIURETIC PEPTIDE: B NATRIURETIC PEPTIDE 5: 214.8 pg/mL — AB (ref 0.0–100.0)

## 2015-04-04 LAB — URINE MICROSCOPIC-ADD ON

## 2015-04-04 MED ORDER — LABETALOL HCL 5 MG/ML IV SOLN
10.0000 mg | INTRAVENOUS | Status: DC | PRN
  Administered 2015-04-04 (×4): 10 mg via INTRAVENOUS
  Administered 2015-04-05 (×2): 20 mg via INTRAVENOUS
  Administered 2015-04-05: 40 mg via INTRAVENOUS
  Administered 2015-04-05: 30 mg via INTRAVENOUS
  Administered 2015-04-05 (×2): 20 mg via INTRAVENOUS
  Administered 2015-04-05: 30 mg via INTRAVENOUS
  Administered 2015-04-05 – 2015-04-06 (×2): 40 mg via INTRAVENOUS
  Administered 2015-04-07: 20 mg via INTRAVENOUS
  Administered 2015-04-07 (×4): 40 mg via INTRAVENOUS
  Administered 2015-04-07: 20 mg via INTRAVENOUS
  Administered 2015-04-07 – 2015-04-08 (×7): 40 mg via INTRAVENOUS
  Administered 2015-04-10: 20 mg via INTRAVENOUS
  Filled 2015-04-04 (×2): qty 4
  Filled 2015-04-04: qty 8
  Filled 2015-04-04: qty 4
  Filled 2015-04-04: qty 8
  Filled 2015-04-04: qty 4
  Filled 2015-04-04 (×2): qty 8
  Filled 2015-04-04: qty 4
  Filled 2015-04-04 (×5): qty 8
  Filled 2015-04-04 (×2): qty 4
  Filled 2015-04-04: qty 8
  Filled 2015-04-04: qty 4
  Filled 2015-04-04 (×2): qty 8
  Filled 2015-04-04: qty 4
  Filled 2015-04-04 (×3): qty 8

## 2015-04-04 MED ORDER — HYDRALAZINE HCL 20 MG/ML IJ SOLN
INTRAMUSCULAR | Status: AC
  Administered 2015-04-04: 10 mg via INTRAVENOUS
  Filled 2015-04-04: qty 1

## 2015-04-04 MED ORDER — PROTHROMBIN COMPLEX CONC HUMAN 500 UNITS IV KIT
2000.0000 [IU] | PACK | INTRAVENOUS | Status: DC
Start: 2015-04-04 — End: 2015-04-04
  Filled 2015-04-04: qty 80

## 2015-04-04 MED ORDER — PROTHROMBIN COMPLEX CONC HUMAN 500 UNITS IV KIT
2180.0000 [IU] | PACK | Status: AC
  Administered 2015-04-04: 2180 [IU] via INTRAVENOUS
  Filled 2015-04-04: qty 87

## 2015-04-04 MED ORDER — PANTOPRAZOLE SODIUM 40 MG IV SOLR
40.0000 mg | Freq: Every day | INTRAVENOUS | Status: DC
  Administered 2015-04-04: 40 mg via INTRAVENOUS
  Filled 2015-04-04 (×2): qty 40

## 2015-04-04 MED ORDER — SENNOSIDES-DOCUSATE SODIUM 8.6-50 MG PO TABS
1.0000 | ORAL_TABLET | Freq: Two times a day (BID) | ORAL | Status: DC
  Administered 2015-04-05 – 2015-04-12 (×11): 1 via ORAL
  Filled 2015-04-04 (×16): qty 1

## 2015-04-04 MED ORDER — STROKE: EARLY STAGES OF RECOVERY BOOK
Freq: Once | Status: AC
  Administered 2015-04-04: 13:00:00
  Filled 2015-04-04 (×2): qty 1

## 2015-04-04 MED ORDER — INFLUENZA VAC SPLIT QUAD 0.5 ML IM SUSY
0.5000 mL | PREFILLED_SYRINGE | INTRAMUSCULAR | Status: AC
  Administered 2015-04-05: 0.5 mL via INTRAMUSCULAR
  Filled 2015-04-04: qty 0.5

## 2015-04-04 MED ORDER — SODIUM CHLORIDE 0.9 % IV SOLN
INTRAVENOUS | Status: DC
  Administered 2015-04-04 – 2015-04-05 (×2): via INTRAVENOUS

## 2015-04-04 MED ORDER — ACETAMINOPHEN 325 MG PO TABS
650.0000 mg | ORAL_TABLET | ORAL | Status: DC | PRN
  Administered 2015-04-04 – 2015-04-09 (×5): 650 mg via ORAL
  Filled 2015-04-04 (×5): qty 2

## 2015-04-04 MED ORDER — HYDRALAZINE HCL 20 MG/ML IJ SOLN
10.0000 mg | Freq: Once | INTRAMUSCULAR | Status: AC
  Administered 2015-04-04: 10 mg via INTRAVENOUS

## 2015-04-04 MED ORDER — ACETAMINOPHEN 650 MG RE SUPP
650.0000 mg | RECTAL | Status: DC | PRN
Start: 2015-04-04 — End: 2015-04-07

## 2015-04-04 MED ORDER — VITAMIN K1 10 MG/ML IJ SOLN
10.0000 mg | INTRAVENOUS | Status: AC
  Administered 2015-04-04: 10 mg via INTRAVENOUS
  Filled 2015-04-04: qty 1

## 2015-04-04 NOTE — H&P (Signed)
Admission H&P    Chief Complaint: Confusion and feeling woozy.  HPI: Nicole [name] is an 81 y.o. female history of atrial fibrillation on Coumadin, hypertension, hyperlipidemia, diabetes mellitus, CHF and chronic kidney disease, brought to the emergency room after being found on the floor home this morning. She was noted to be somewhat confused last night. She complained of feeling of wooziness and slight headache at 3:30 PM yesterday. She has no previous history of stroke nor TIA. CT scan of her head showed fairly large intracerebral hemorrhage involving the right caudate and extending into right ventricle with mass effect with mild midline shift as well as right ventricular enlargement. She has been evaluated by Dr. Saintclair Halsted of the Neurosurgery Service. Ventricular drain is not felt to be warranted at this time. Cerebral hematoma not considered operable. Patient is being admitted to neuro intensive care unit for further management, including immediate coagulation reversal. INR was 3.6. Patient's blood pressure was elevated at 166/103. She was given labetalol IV for immediate management.  LSN: 3:30 PM on 04/03/2015 tPA Given: No: Acute ICH mRankin:  Past Medical History  Diagnosis Date  . Colon cancer 07/1997  . Hypertension   . Diabetes mellitus   . CHF (congestive heart failure)   . AAA (abdominal aortic aneurysm)   . Atrial fibrillation   . Hypercholesteremia   . Kidney stone     renal calculi  . Small bowel obstruction due to adhesions 05/16/2012  . Hypothyroidism   . Arthritis   . Anemia   . Cholelithiasis   . Perforation of bile duct   . Hemorrhage of rectum and anus   . Swelling, mass, or lump in head and neck   . Cervicalgia   . Hypoglycemia, unspecified   . Pain in joint, lower leg   . Anxiety   . Shortness of breath   . Acute upper respiratory infections of unspecified site   . Depression   . Thoracic aneurysm without mention of rupture   . GERD (gastroesophageal reflux  disease)   . Proteinuria   . Other specified cardiac dysrhythmias(427.89)   . Congestive heart failure, unspecified   . Electrolyte and fluid disorders not elsewhere classified   . Other chronic allergic conjunctivitis   . Hypopotassemia   . Chronic systolic heart failure   . Dizziness and giddiness   . Malignant neoplasm of colon, unspecified site   . Other and unspecified hyperlipidemia   . Gout, unspecified   . Atrial fibrillation   . Aortic aneurysm of unspecified site without mention of rupture   . Pain in joint, site unspecified   . Palpitations   . CKD (chronic kidney disease)   . Diabetes mellitus     Past Surgical History  Procedure Laterality Date  . Hemicolectomy  08/11/1997  . Kidney stone surgery    . Colon surgery      to remove colon ca  . Cholecystectomy  05/14/2012    Procedure: LAPAROSCOPIC CHOLECYSTECTOMY WITH INTRAOPERATIVE CHOLANGIOGRAM;  Surgeon: Haywood Lasso, MD;  Location: [place];  Service: General;  Laterality: N/A;  . Ercp  05/17/2012    Procedure: ENDOSCOPIC RETROGRADE CHOLANGIOPANCREATOGRAPHY (ERCP);  Surgeon: Missy Sabins, MD;  Location: [place];  Service: Gastroenterology;  Laterality: N/A;  . Ercp  08/12/2012    Procedure: ENDOSCOPIC RETROGRADE CHOLANGIOPANCREATOGRAPHY (ERCP);  Surgeon: Missy Sabins, MD;  Location[facility] ENDOSCOPY;  Service: Endoscopy;  Laterality: N/A;  . Transthoracic echocardiogram  11/2009    EF 45-50%, mild-mod AV regurg;  mild MR; mod TR; ascending aorta mildly dilated  . Cardiac catheterization  2008    moderate severe pulm HTN; with elevted pulm capillary wedge pressure     Family History  Problem Relation Age of Onset  . Heart disease Mother   . Stroke Father   . Hypertension Daughter   . Hypertension Son   . Diabetes Sister   . Hypertension Son    Social History:  reports that she has never smoked. She has never used smokeless tobacco. She reports that she does not drink alcohol or use illicit drugs.  Allergies:   Allergies  Allergen Reactions  . Iohexol      Code: VOM, Desc: PT REPORTS VOMITING W/ IVP DYE- ARS 12/26/08, Onset Date: [id number]   . Z-Pak [Azithromycin]   . Iodinated Diagnostic Agents Nausea Only    PT HAS HAD SINGULAR INCIDENT OF NAUSEA  W/ IV CONTYRAST INJECTION, NONE IF INJECTED SLOWLY//A.CALHOUN    Medications: Patient's preadmission medications were reviewed by me.  ROS: History obtained from the patient  General ROS: negative for - chills, fatigue, fever, night sweats, weight gain or weight loss Psychological ROS: negative for - behavioral disorder, hallucinations, memory difficulties, mood swings or suicidal ideation Ophthalmic ROS: negative for - blurry vision, double vision, eye pain or loss of vision ENT ROS: negative for - epistaxis, nasal discharge, oral lesions, sore throat, tinnitus or vertigo Allergy and Immunology ROS: negative for - hives or itchy/watery eyes Hematological and Lymphatic ROS: negative for - bleeding problems, bruising or swollen lymph nodes Endocrine ROS: negative for - galactorrhea, hair pattern changes, polydipsia/polyuria or temperature intolerance Respiratory ROS: negative for - cough, hemoptysis, shortness of breath or wheezing Cardiovascular ROS: SOB with exertion Gastrointestinal ROS: negative for - abdominal pain, diarrhea, hematemesis, nausea/vomiting or stool incontinence Genito-Urinary ROS: negative for - dysuria, hematuria, incontinence or urinary frequency/urgency Musculoskeletal ROS: negative for - joint swelling or muscular weakness Neurological ROS: as noted in HPI Dermatological ROS: negative for rash and skin lesion changes  Physical Examination: Blood pressure 166/103, pulse 93, temperature 98.3 F (36.8 C), temperature source Oral, resp. rate 24, weight 79.379 kg (175 lb), SpO2 100 %.  HEENT-  Normocephalic, no lesions, without obvious abnormality.  Normal external eye and conjunctiva.  Normal TM's bilaterally.  Normal  auditory canals and external ears. Normal external nose, mucus membranes and septum.  Normal pharynx. Neck supple with no masses, nodes, nodules or enlargement. Cardiovascular - irregularly irregular rhythm and S1, S2 normal Lungs - chest clear, no wheezing, rales, normal symmetric air entry Abdomen - soft, non-tender; bowel sounds normal; no masses,  no organomegaly Extremities - no joint deformities, effusion, or inflammation  Neurologic Examination: Mental Status: Slightly lethargic, oriented, thought content appropriate.  Speech fluent without evidence of aphasia. Able to follow commands without difficulty. Cranial Nerves: II-Visual fields were normal. III/IV/VI-Pupils were equal and reacted. Extraocular movements were full and conjugate.    V/VII-no facial numbness; equivocal left lower facial weakness. VIII-normal. X-normal speech and symmetrical palatal movement. XI: trapezius strength/neck flexion strength normal bilaterally XII-midline tongue extension with normal strength. Motor: 5/5 bilaterally with normal tone and bulk Sensory: Normal throughout. Deep Tendon Reflexes: Trace to 1+ and symmetric. Plantars: Flexor bilaterally Cerebellar: Normal finger-to-nose testing. Carotid auscultation: Normal  Results for orders placed or performed during the hospital encounter of 04/04/15 (from the past 48 hour(s))  CBC with Differential     Status: Abnormal   Collection Time: 04/04/15  9:45 AM  Result Value Ref Range  WBC 7.1 4.0 - 10.5 K/uL   RBC 4.85 3.87 - 5.11 MIL/uL   Hemoglobin 12.8 12.0 - 15.0 g/dL   HCT 37.4 36.0 - 46.0 %   MCV 77.1 (L) 78.0 - 100.0 fL   MCH 26.4 26.0 - 34.0 pg   MCHC 34.2 30.0 - 36.0 g/dL   RDW 17.2 (H) 11.5 - 15.5 %   Platelets 329 150 - 400 K/uL   Neutrophils Relative % 86 (H) 43 - 77 %   Neutro Abs 6.1 1.7 - 7.7 K/uL   Lymphocytes Relative 9 (L) 12 - 46 %   Lymphs Abs 0.6 (L) 0.7 - 4.0 K/uL   Monocytes Relative 5 3 - 12 %   Monocytes Absolute 0.3  0.1 - 1.0 K/uL   Eosinophils Relative 0 0 - 5 %   Eosinophils Absolute 0.0 0.0 - 0.7 K/uL   Basophils Relative 0 0 - 1 %   Basophils Absolute 0.0 0.0 - 0.1 K/uL  Comprehensive metabolic panel     Status: Abnormal   Collection Time: 04/04/15  9:45 AM  Result Value Ref Range   Sodium 142 135 - 145 mmol/L   Potassium 3.8 3.5 - 5.1 mmol/L   Chloride 106 101 - 111 mmol/L   CO2 24 22 - 32 mmol/L   Glucose, Bld 198 (H) 65 - 99 mg/dL   BUN 14 6 - 20 mg/dL   Creatinine, Ser 1.35 (H) 0.44 - 1.00 mg/dL   Calcium 9.1 8.9 - 10.3 mg/dL   Total Protein 7.8 6.5 - 8.1 g/dL   Albumin 3.6 3.5 - 5.0 g/dL   AST 22 15 - 41 U/L   ALT 10 (L) 14 - 54 U/L   Alkaline Phosphatase 91 38 - 126 U/L   Total Bilirubin 1.0 0.3 - 1.2 mg/dL   GFR calc non Af Amer 35 (L) >60 mL/min   GFR calc Af Amer 41 (L) >60 mL/min    Comment: (NOTE) The eGFR has been calculated using the CKD EPI equation. This calculation has not been validated in all clinical situations. eGFR's persistently <60 mL/min signify possible Chronic Kidney Disease.    Anion gap 12 5 - 15  Brain natriuretic peptide     Status: Abnormal   Collection Time: 04/04/15  9:45 AM  Result Value Ref Range   B Natriuretic Peptide 214.8 (H) 0.0 - 100.0 pg/mL  I-stat troponin, ED     Status: None   Collection Time: 04/04/15 10:17 AM  Result Value Ref Range   Troponin i, poc 0.02 0.00 - 0.08 ng/mL   Comment 3            Comment: Due to the release kinetics of cTnI, a negative result within the first hours of the onset of symptoms does not rule out myocardial infarction with certainty. If myocardial infarction is still suspected, repeat the test at appropriate intervals.   Urinalysis, Routine w reflex microscopic (not at [facility])     Status: Abnormal   Collection Time: 04/04/15 11:09 AM  Result Value Ref Range   Color, Urine YELLOW YELLOW   APPearance CLEAR CLEAR   Specific Gravity, Urine 1.015 1.005 - 1.030   pH 7.0 5.0 - 8.0   Glucose, UA 100 (A)  NEGATIVE mg/dL   Hgb urine dipstick MODERATE (A) NEGATIVE   Bilirubin Urine NEGATIVE NEGATIVE   Ketones, ur 15 (A) NEGATIVE mg/dL   Protein, ur >300 (A) NEGATIVE mg/dL   Urobilinogen, UA 1.0 0.0 - 1.0 mg/dL  Nitrite NEGATIVE NEGATIVE   Leukocytes, UA NEGATIVE NEGATIVE  Urine microscopic-add on     Status: None   Collection Time: 04/04/15 11:09 AM  Result Value Ref Range   Squamous Epithelial / LPF RARE RARE   RBC / HPF 11-20 <3 RBC/hpf   Dg Chest 2 View  04/04/2015   CLINICAL DATA:  Generalize weakness, confusion, hypertension and rapid atrial fibrillation.  EXAM: CHEST - 2 VIEW  COMPARISON:  10/04/2014  FINDINGS: Stable mild cardiac enlargement. There is no evidence of pulmonary edema, consolidation, pneumothorax, nodule or pleural fluid. Stable mild spondylosis of the thoracic spine.  IMPRESSION: Stable mild cardiac enlargement.  No active disease.   Electronically Signed   By: Aletta Edouard M.D.   On: 04/04/2015 10:40   Ct Head Wo Contrast  04/04/2015   CLINICAL DATA:  Generalize weakness, headache and confusion. History of hypertension and atrial fibrillation with rapid ventricular response.  EXAM: CT HEAD WITHOUT CONTRAST  TECHNIQUE: Contiguous axial images were obtained from the base of the skull through the vertex without intravenous contrast.  COMPARISON:  02/12/2009  FINDINGS: There is evidence of acute hemorrhage with a large parenchymal hematoma present centered just superior to the frontal horn of the right lateral ventricle, likely in the region of the caudate nucleus with rupture of acute blood into the frontal foreign of the lateral ventricle and blood also seen within the third ventricle. The parenchymal hemorrhage measures roughly 3.2 x 4.6 x 3.0 cm in dimensions. There is some surrounding edema and associated asymmetric dilatation of the right lateral ventricle compared to the left consistent with trapping of the ventricle developing hydrocephalus. Some layering blood is also  noted in the temporal horn of the right lateral ventricle. Mild shift of the midline is noted to the left of approximately 5 mm. No subarachnoid or extra-axial hemorrhage identified. No evidence of cerebral infarction. The skull is unremarkable.  IMPRESSION: Large acute hemorrhage centered in the region of the right caudate nucleus with rupture into the lateral ventricle and evidence of developing hydrocephalus and trapping of the right lateral ventricle. There is a mild amount of midline shift to the left. Parenchymal hemorrhage measures roughly 3.2 x 4.6 x 3.0 cm. This is most likely a hypertensive parenchymal bleed.  These results were called by telephone at the time of interpretation on 04/04/2015 at 11:09 am to [facility], PA-C , who verbally acknowledged these results.   Electronically Signed   By: Aletta Edouard M.D.   On: 04/04/2015 11:12    Assessment: 81 y.o. female with multiple risk factors for stroke presenting with acute right vasoganglia hemorrhage, most likely secondary to hypertension, with mass effect as well as tracheal extension and early signs of right ventricular obstruction patient also presented with hypertensive urgency.  Stroke Risk Factors - atrial fibrillation, diabetes mellitus, family history, hyperlipidemia and hypertension  Plan: 1. Acute anticoagulation reversal per protocol 2. Repeat CT  of the brain without contrast in the a.m., or if patient worsens clinically in the interim 3. PT consult, OT consult, Speech consult 4. Echocardiogram 5. Carotid dopplers 6. Prophylactic therapy-None 7. Risk factor modification 8. HgbA1c, fasting lipid panel  This patient is critically ill and at significant risk of neurological worsening or death, and care requires constant monitoring of vital signs, hemodynamics,respiratory and cardiac monitoring, neurological assessment, discussion with family, other specialists and medical decision making of high complexity. Total critical  care time was 60 minutes.  C.R. Nicole Kindred, MD 04/04/2015, 12:30 PM

## 2015-04-04 NOTE — ED Notes (Signed)
Dr. Vanita Panda made aware of INR and states to go ahead with reversal medications. Per pharmacy start with [place]. Will continue to monitor.

## 2015-04-04 NOTE — Care Management Note (Signed)
Case Management Note  Patient Details  Name: NIKYLA NAVEDO MRN: [id number] Date of Birth: Aug 07, 1930  Subjective/Objective:      Pt admitted on 04/04/15 with large Rt basal ganglia ICH. PTA, pt independent of ADLS.                Action/Plan: PT/OT evals pending; Will follow for discharge planning as pt progresses.    Expected Discharge Date:                  Expected Discharge Plan:  [place]  In-House Referral:     Discharge planning Services  CM Consult  Post Acute Care Choice:    Choice offered to:     DME Arranged:    DME Agency:     HH Arranged:    [place] Agency:     Status of Service:  In process, will continue to follow  Medicare Important Message Given:    Date Medicare IM Given:    Medicare IM give by:    Date Additional Medicare IM Given:    Additional Medicare Important Message give by:     If discussed at [place] of Stay Meetings, dates discussed:    Additional Comments:  Reinaldo Raddle, RN, BSN  Trauma/Neuro ICU Case Manager [phone]

## 2015-04-04 NOTE — ED Notes (Signed)
PA at bedside.

## 2015-04-04 NOTE — ED Provider Notes (Signed)
CSN: [id number]     Arrival date & time 04/04/15  0109 History   First MD Initiated Contact with Patient 04/04/15 [phone]     Chief Complaint  Patient presents with  . Atrial Fibrillation     (Consider location/radiation/quality/duration/timing/severity/associated sxs/prior Treatment) Patient is a 81 y.o. female presenting with atrial fibrillation. The history is provided by the patient and medical records.  Atrial Fibrillation Associated symptoms include headaches and weakness.    This is an 81 y.o. F with hx of HTN, DM, CHF, AAA, AFIB, hypothyroidism, anemia, anxiety, presenting to the ED for generalized weakness.  Patient was found by friend this morning who reports she appeared somewhat confused and generally weak.  Denies focal weakness. EMS reports she was unable to walk from bed to stretcher this morning on their arrival.  Patient states she has generally been feeling fairly well up until today.  Denies fever, chills, sweats, or recent illness.  Denies chest pain, SOB, palpitations, diaphoresis, nausea, or vomiting.  Has been eating and drinking normally.  States she does have a headache currently.  No recent falls or head injuries.  Patient has known AFIB, followed by Dr. Debara Pickett with [facility].  VSS.  Past Medical History  Diagnosis Date  . Colon cancer 07/1997  . Hypertension   . Diabetes mellitus   . CHF (congestive heart failure)   . AAA (abdominal aortic aneurysm)   . Atrial fibrillation   . Hypercholesteremia   . Kidney stone     renal calculi  . Small bowel obstruction due to adhesions 05/16/2012  . Hypothyroidism   . Arthritis   . Anemia   . Cholelithiasis   . Perforation of bile duct   . Hemorrhage of rectum and anus   . Swelling, mass, or lump in head and neck   . Cervicalgia   . Hypoglycemia, unspecified   . Pain in joint, lower leg   . Anxiety   . Shortness of breath   . Acute upper respiratory infections of unspecified site   . Depression   . Thoracic aneurysm  without mention of rupture   . GERD (gastroesophageal reflux disease)   . Proteinuria   . Other specified cardiac dysrhythmias(427.89)   . Congestive heart failure, unspecified   . Electrolyte and fluid disorders not elsewhere classified   . Other chronic allergic conjunctivitis   . Hypopotassemia   . Chronic systolic heart failure   . Dizziness and giddiness   . Malignant neoplasm of colon, unspecified site   . Other and unspecified hyperlipidemia   . Gout, unspecified   . Atrial fibrillation   . Aortic aneurysm of unspecified site without mention of rupture   . Pain in joint, site unspecified   . Palpitations   . CKD (chronic kidney disease)   . Diabetes mellitus    Past Surgical History  Procedure Laterality Date  . Hemicolectomy  08/11/1997  . Kidney stone surgery    . Colon surgery      to remove colon ca  . Cholecystectomy  05/14/2012    Procedure: LAPAROSCOPIC CHOLECYSTECTOMY WITH INTRAOPERATIVE CHOLANGIOGRAM;  Surgeon: Haywood Lasso, MD;  Location: [place];  Service: General;  Laterality: N/A;  . Ercp  05/17/2012    Procedure: ENDOSCOPIC RETROGRADE CHOLANGIOPANCREATOGRAPHY (ERCP);  Surgeon: Missy Sabins, MD;  Location: [place];  Service: Gastroenterology;  Laterality: N/A;  . Ercp  08/12/2012    Procedure: ENDOSCOPIC RETROGRADE CHOLANGIOPANCREATOGRAPHY (ERCP);  Surgeon: Missy Sabins, MD;  Location: [place];  Service:  Endoscopy;  Laterality: N/A;  . Transthoracic echocardiogram  11/2009    EF 45-50%, mild-mod AV regurg; mild MR; mod TR; ascending aorta mildly dilated  . Cardiac catheterization  2008    moderate severe pulm HTN; with elevted pulm capillary wedge pressure    Family History  Problem Relation Age of Onset  . Heart disease Mother   . Stroke Father   . Hypertension Daughter   . Hypertension Son   . Diabetes Sister   . Hypertension Son    Social History  Substance Use Topics  . Smoking status: Never Smoker   . Smokeless tobacco: Never Used  .  Alcohol Use: No   OB History    No data available     Review of Systems  Neurological: Positive for weakness and headaches.  All other systems reviewed and are negative.     Allergies  Iohexol; Z-pak; and Iodinated diagnostic agents  Home Medications   Prior to Admission medications   Medication Sig Start Date End Date Taking? Authorizing Provider  allopurinol (ZYLOPRIM) 100 MG tablet Take 1 tablet (100 mg total) by mouth daily. 11/24/14   Gildardo Cranker, DO  bacitracin ointment Apply 1 application topically 2 (two) times daily. 10/04/14   Wandra Arthurs, MD  Cholecalciferol (VITAMIN D3) 2000 UNITS TABS Take by mouth daily.    Historical Provider, MD  diltiazem (CARDIZEM CD) 240 MG 24 hr capsule take 1 capsule by mouth once daily 01/18/15   Pixie Casino, MD  furosemide (LASIX) 40 MG tablet take 1 tablet by mouth twice a day 04/14/14   Pixie Casino, MD  glipiZIDE (GLUCOTROL) 10 MG tablet Take one tablet by mouth once daily to control blood sugar 01/07/15   [name], DO  Lancet Devices (ADJUSTABLE LANCING DEVICE) MISC Check blood sugar once daily as directed 09/14/14   Historical Provider, MD  levothyroxine (SYNTHROID, LEVOTHROID) 50 MCG tablet Take one tablet by mouth 30 minutes before breakfast once daily for thyroid 07/27/14   Blanchie Serve, MD  metFORMIN (GLUCOPHAGE) 500 MG tablet Take 1 tablet (500 mg total) by mouth 2 (two) times daily with a meal. 03/11/15   [name] [name], DO  metoprolol (LOPRESSOR) 25 MG tablet Take 1 tablet (25 mg total) by mouth 2 (two) times daily. 02/15/14   Orpah Greek, MD  ONE TOUCH ULTRA TEST test strip Check blood sugar once daily as directed 09/14/14   Historical Provider, MD  polyethylene glycol (MIRALAX / GLYCOLAX) packet Take 17 g by mouth daily as needed for mild constipation.     Historical Provider, MD  potassium chloride SA (K-DUR,KLOR-CON) 20 MEQ tablet Take one tablet by mouth once daily for potassium 03/11/15   [name] [name], DO   simvastatin (ZOCOR) 40 MG tablet Take one tablet by mouth once daily for cholesterol 11/24/14   Gildardo Cranker, DO  warfarin (COUMADIN) 5 MG tablet Take 2.5-5 mg by mouth See admin instructions. Take 1 tablet Monday ,Wednesday and Friday.  Take 0.5 tablet on Tuesday, Thursday, Saturday and Sunday.    Historical Provider, MD  warfarin (COUMADIN) 5 MG tablet take 1 tablet by mouth once daily or as directed 02/24/15   Pixie Casino, MD  zoster vaccine live, PF, (ZOSTAVAX) 62563 UNT/0.65ML injection Inject 0.65 mLs into the skin once.    Historical Provider, MD   BP 175/96 mmHg  Pulse 106  Temp(Src) 98.3 F (36.8 C) (Oral)  Resp 26  SpO2 97%   Physical Exam  Constitutional: She is oriented to person, place, and time. She appears well-developed and well-nourished.  Elderly, no acute distress, appears sleepy but not lethargic  HENT:  Head: Normocephalic and atraumatic.  Mouth/Throat: Oropharynx is clear and moist.  No visible signs of head trauma  Eyes: Conjunctivae and EOM are normal. Pupils are equal, round, and reactive to light.  PERRL  Neck: Normal range of motion.  Protecting airway  Cardiovascular: Normal rate, regular rhythm and normal heart sounds.   Pulmonary/Chest: Effort normal and breath sounds normal.  Abdominal: Soft. Bowel sounds are normal.  Musculoskeletal: Normal range of motion.  Neurological: She is alert and oriented to person, place, and time.  AAOx3, answering questions appropriately; equal strength UE and LE bilaterally; CN grossly intact; moves all extremities appropriately without ataxia; no focal neuro deficits or facial asymmetry appreciated  Skin: Skin is warm and dry.  Psychiatric: She has a normal mood and affect.  Nursing note and vitals reviewed.   ED Course  Procedures (including critical care time)  CRITICAL CARE Performed by: Larene Pickett   Total critical care time: 60  Critical care time was exclusive of separately billable procedures and  treating other patients.  Critical care was necessary to treat or prevent imminent or life-threatening deterioration.  Critical care was time spent personally by me on the following activities: development of treatment plan with patient and/or surrogate as well as nursing, discussions with consultants, evaluation of patient's response to treatment, examination of patient, obtaining history from patient or surrogate, ordering and performing treatments and interventions, ordering and review of laboratory studies, ordering and review of radiographic studies, pulse oximetry and re-evaluation of patient's condition.  Medications  phytonadione (VITAMIN K) 10 mg in dextrose 5 % 50 mL IVPB (10 mg Intravenous New Bag/Given 04/04/15 1327)   stroke: mapping our early stages of recovery book (not administered)  acetaminophen (TYLENOL) tablet 650 mg (not administered)    Or  acetaminophen (TYLENOL) suppository 650 mg (not administered)  senna-docusate (Senokot-S) tablet 1 tablet (not administered)  labetalol (NORMODYNE,TRANDATE) injection 10-40 mg (10 mg Intravenous Given 04/04/15 1323)  pantoprazole (PROTONIX) injection 40 mg (not administered)  0.9 %  sodium chloride infusion (not administered)  prothrombin complex conc human (KCENTRA) IVPB 2,180 Units (2,180 Units Intravenous Given 04/04/15 1247)   Labs Review Labs Reviewed  CBC WITH DIFFERENTIAL/PLATELET - Abnormal; Notable for the following:    MCV 77.1 (*)    RDW 17.2 (*)    Neutrophils Relative % 86 (*)    Lymphocytes Relative 9 (*)    Lymphs Abs 0.6 (*)    All other components within normal limits  COMPREHENSIVE METABOLIC PANEL - Abnormal; Notable for the following:    Glucose, Bld 198 (*)    Creatinine, Ser 1.35 (*)    ALT 10 (*)    GFR calc non Af Amer 35 (*)    GFR calc Af Amer 41 (*)    All other components within normal limits  BRAIN NATRIURETIC PEPTIDE - Abnormal; Notable for the following:    B Natriuretic Peptide 214.8 (*)    All  other components within normal limits  URINALYSIS, ROUTINE W REFLEX MICROSCOPIC (NOT AT [facility])  PROTIME-INR  Randolm Idol, ED    Imaging Review Dg Chest 2 View  04/04/2015   CLINICAL DATA:  Generalize weakness, confusion, hypertension and rapid atrial fibrillation.  EXAM: CHEST - 2 VIEW  COMPARISON:  10/04/2014  FINDINGS: Stable mild cardiac enlargement. There is no evidence of pulmonary edema, consolidation,  pneumothorax, nodule or pleural fluid. Stable mild spondylosis of the thoracic spine.  IMPRESSION: Stable mild cardiac enlargement.  No active disease.   Electronically Signed   By: Aletta Edouard M.D.   On: 04/04/2015 10:40   Ct Head Wo Contrast  04/04/2015   CLINICAL DATA:  Generalize weakness, headache and confusion. History of hypertension and atrial fibrillation with rapid ventricular response.  EXAM: CT HEAD WITHOUT CONTRAST  TECHNIQUE: Contiguous axial images were obtained from the base of the skull through the vertex without intravenous contrast.  COMPARISON:  02/12/2009  FINDINGS: There is evidence of acute hemorrhage with a large parenchymal hematoma present centered just superior to the frontal horn of the right lateral ventricle, likely in the region of the caudate nucleus with rupture of acute blood into the frontal foreign of the lateral ventricle and blood also seen within the third ventricle. The parenchymal hemorrhage measures roughly 3.2 x 4.6 x 3.0 cm in dimensions. There is some surrounding edema and associated asymmetric dilatation of the right lateral ventricle compared to the left consistent with trapping of the ventricle developing hydrocephalus. Some layering blood is also noted in the temporal horn of the right lateral ventricle. Mild shift of the midline is noted to the left of approximately 5 mm. No subarachnoid or extra-axial hemorrhage identified. No evidence of cerebral infarction. The skull is unremarkable.  IMPRESSION: Large acute hemorrhage centered in the region  of the right caudate nucleus with rupture into the lateral ventricle and evidence of developing hydrocephalus and trapping of the right lateral ventricle. There is a mild amount of midline shift to the left. Parenchymal hemorrhage measures roughly 3.2 x 4.6 x 3.0 cm. This is most likely a hypertensive parenchymal bleed.  These results were called by telephone at the time of interpretation on 04/04/2015 at 11:09 am to [facility], PA-C , who verbally acknowledged these results.   Electronically Signed   By: Aletta Edouard M.D.   On: 04/04/2015 11:12   I have personally reviewed and evaluated these images and lab results as part of my medical decision-making.   EKG Interpretation None      MDM   Final diagnoses:  Intracranial hemorrhage  Anticoagulated on Coumadin   81 y.o. F here with questionable change in mental status and generalized weakness onset this morning.  Patient's only current complaint is headache.  She is on coumadin for AFIB.  Patient afebrile, non-toxic.  She does appear sleepy but is not lethargic.  AAOx3 without focal numbness/weakness appreciated on my exam.  Given her anti-coagulant use, current headache, and possible change in mental status will obtain CT head, CXR, labs.    Head CT revealing large right parenchymal hemorrhage with rupture into lateral ventricle with developing hydrocephalus-- appears to be hypertensive bleed.  Current BP 166/103.  PT-INR pending.  Will consult neurosurgery for recommendations.  Patient and family updated, acknowledged understanding of current situation.  11:48 AM Case discussed with neurosurgery, Dr. Saintclair Halsted-- recommends rapid reversal protocol now.  Patient continues to respond to verbal/tactile stimuli, protecting airway.  Admit to neurology for further management.  He will come see patient in ED for formal consult.  12:17 PM Neurology, Dr. Nicole Kindred, at bedside to evaluate and admit to neuro ICU.  Larene Pickett, PA-C 04/04/15  [place], MD 04/07/15 [phone]

## 2015-04-04 NOTE — ED Notes (Signed)
Dr Stewart at bedside.

## 2015-04-04 NOTE — ED Notes (Signed)
Dr. Cram at bedside 

## 2015-04-04 NOTE — ED Notes (Signed)
Pharmacy called for medications. 

## 2015-04-04 NOTE — ED Notes (Signed)
Per EMS- pt was found by friend this morning to be confused and have generalized weakness. Pt was hypertensive on arrival with afib rvr on monitor. Pt has not had her daily meds for hypertension today. Pt reports headache as well.

## 2015-04-04 NOTE — Progress Notes (Signed)
Patient arrived to the unit from the ED at 1420 in stable condition. No belongings with patient. Family in the waiting area. Plan of care to be initiated.

## 2015-04-04 NOTE — Consult Note (Signed)
Reason for Consult: Intracerebral hemorrhage   Referring Physician: Emergency department Dr. Massie Maroon Nicole [name] is an 81 y.o. female.  HPI: Patient is a very pleasant 81 year old female is noted to have some confusion last night was found on the ground this morning no known loss of consciousness patient was alert however she states she couldn't get up. Patient was picked up by EMS transport emergency room was noted to have altered mental status and CT scan of her head showed a large right basal ganglia intracerebral hemorrhage with intraventricular extension. Currently the patient denies any significant headache and nausea she does report an related history of hypertension. She is also a history of atrial fibrillation on Coumadin.  Past Medical History  Diagnosis Date  . Colon cancer 07/1997  . Hypertension   . Diabetes mellitus   . CHF (congestive heart failure)   . AAA (abdominal aortic aneurysm)   . Atrial fibrillation   . Hypercholesteremia   . Kidney stone     renal calculi  . Small bowel obstruction due to adhesions 05/16/2012  . Hypothyroidism   . Arthritis   . Anemia   . Cholelithiasis   . Perforation of bile duct   . Hemorrhage of rectum and anus   . Swelling, mass, or lump in head and neck   . Cervicalgia   . Hypoglycemia, unspecified   . Pain in joint, lower leg   . Anxiety   . Shortness of breath   . Acute upper respiratory infections of unspecified site   . Depression   . Thoracic aneurysm without mention of rupture   . GERD (gastroesophageal reflux disease)   . Proteinuria   . Other specified cardiac dysrhythmias(427.89)   . Congestive heart failure, unspecified   . Electrolyte and fluid disorders not elsewhere classified   . Other chronic allergic conjunctivitis   . Hypopotassemia   . Chronic systolic heart failure   . Dizziness and giddiness   . Malignant neoplasm of colon, unspecified site   . Other and unspecified hyperlipidemia   . Gout,  unspecified   . Atrial fibrillation   . Aortic aneurysm of unspecified site without mention of rupture   . Pain in joint, site unspecified   . Palpitations   . CKD (chronic kidney disease)   . Diabetes mellitus     Past Surgical History  Procedure Laterality Date  . Hemicolectomy  08/11/1997  . Kidney stone surgery    . Colon surgery      to remove colon ca  . Cholecystectomy  05/14/2012    Procedure: LAPAROSCOPIC CHOLECYSTECTOMY WITH INTRAOPERATIVE CHOLANGIOGRAM;  Surgeon: Haywood Lasso, MD;  Location: [facility];  Service: General;  Laterality: N/A;  . Ercp  05/17/2012    Procedure: ENDOSCOPIC RETROGRADE CHOLANGIOPANCREATOGRAPHY (ERCP);  Surgeon: Missy Sabins, MD;  Location: [facility];  Service: Gastroenterology;  Laterality: N/A;  . Ercp  08/12/2012    Procedure: ENDOSCOPIC RETROGRADE CHOLANGIOPANCREATOGRAPHY (ERCP);  Surgeon: Missy Sabins, MD;  Location: [facility] ENDOSCOPY;  Service: Endoscopy;  Laterality: N/A;  . Transthoracic echocardiogram  11/2009    EF 45-50%, mild-mod AV regurg; mild MR; mod TR; ascending aorta mildly dilated  . Cardiac catheterization  2008    moderate severe pulm HTN; with elevted pulm capillary wedge pressure     Family History  Problem Relation Age of Onset  . Heart disease Mother   . Stroke Father   . Hypertension Daughter   . Hypertension Son   . Diabetes Sister   .  Hypertension Son     Social History:  reports that she has never smoked. She has never used smokeless tobacco. She reports that she does not drink alcohol or use illicit drugs.  Allergies:  Allergies  Allergen Reactions  . Iohexol      Code: VOM, Desc: PT REPORTS VOMITING W/ IVP DYE- ARS 12/26/08, Onset Date: [id number]   . Z-Pak [Azithromycin]   . Iodinated Diagnostic Agents Nausea Only    PT HAS HAD SINGULAR INCIDENT OF NAUSEA  W/ IV CONTYRAST INJECTION, NONE IF INJECTED SLOWLY//A.CALHOUN    Medications: I have reviewed the patient's current medications.  Results for orders placed or  performed during the hospital encounter of 04/04/15 (from the past 48 hour(s))  CBC with Differential     Status: Abnormal   Collection Time: 04/04/15  9:45 AM  Result Value Ref Range   WBC 7.1 4.0 - 10.5 K/uL   RBC 4.85 3.87 - 5.11 MIL/uL   Hemoglobin 12.8 12.0 - 15.0 g/dL   HCT 37.4 36.0 - 46.0 %   MCV 77.1 (L) 78.0 - 100.0 fL   MCH 26.4 26.0 - 34.0 pg   MCHC 34.2 30.0 - 36.0 g/dL   RDW 17.2 (H) 11.5 - 15.5 %   Platelets 329 150 - 400 K/uL   Neutrophils Relative % 86 (H) 43 - 77 %   Neutro Abs 6.1 1.7 - 7.7 K/uL   Lymphocytes Relative 9 (L) 12 - 46 %   Lymphs Abs 0.6 (L) 0.7 - 4.0 K/uL   Monocytes Relative 5 3 - 12 %   Monocytes Absolute 0.3 0.1 - 1.0 K/uL   Eosinophils Relative 0 0 - 5 %   Eosinophils Absolute 0.0 0.0 - 0.7 K/uL   Basophils Relative 0 0 - 1 %   Basophils Absolute 0.0 0.0 - 0.1 K/uL  Comprehensive metabolic panel     Status: Abnormal   Collection Time: 04/04/15  9:45 AM  Result Value Ref Range   Sodium 142 135 - 145 mmol/L   Potassium 3.8 3.5 - 5.1 mmol/L   Chloride 106 101 - 111 mmol/L   CO2 24 22 - 32 mmol/L   Glucose, Bld 198 (H) 65 - 99 mg/dL   BUN 14 6 - 20 mg/dL   Creatinine, Ser 1.35 (H) 0.44 - 1.00 mg/dL   Calcium 9.1 8.9 - 10.3 mg/dL   Total Protein 7.8 6.5 - 8.1 g/dL   Albumin 3.6 3.5 - 5.0 g/dL   AST 22 15 - 41 U/L   ALT 10 (L) 14 - 54 U/L   Alkaline Phosphatase 91 38 - 126 U/L   Total Bilirubin 1.0 0.3 - 1.2 mg/dL   GFR calc non Af Amer 35 (L) >60 mL/min   GFR calc Af Amer 41 (L) >60 mL/min    Comment: (NOTE) The eGFR has been calculated using the CKD EPI equation. This calculation has not been validated in all clinical situations. eGFR's persistently <60 mL/min signify possible Chronic Kidney Disease.    Anion gap 12 5 - 15  Brain natriuretic peptide     Status: Abnormal   Collection Time: 04/04/15  9:45 AM  Result Value Ref Range   B Natriuretic Peptide 214.8 (H) 0.0 - 100.0 pg/mL  I-stat troponin, ED     Status: None   Collection  Time: 04/04/15 10:17 AM  Result Value Ref Range   Troponin i, poc 0.02 0.00 - 0.08 ng/mL   Comment 3  Comment: Due to the release kinetics of cTnI, a negative result within the first hours of the onset of symptoms does not rule out myocardial infarction with certainty. If myocardial infarction is still suspected, repeat the test at appropriate intervals.   Urinalysis, Routine w reflex microscopic (not at [facility])     Status: Abnormal   Collection Time: 04/04/15 11:09 AM  Result Value Ref Range   Color, Urine YELLOW YELLOW   APPearance CLEAR CLEAR   Specific Gravity, Urine 1.015 1.005 - 1.030   pH 7.0 5.0 - 8.0   Glucose, UA 100 (A) NEGATIVE mg/dL   Hgb urine dipstick MODERATE (A) NEGATIVE   Bilirubin Urine NEGATIVE NEGATIVE   Ketones, ur 15 (A) NEGATIVE mg/dL   Protein, ur >300 (A) NEGATIVE mg/dL   Urobilinogen, UA 1.0 0.0 - 1.0 mg/dL   Nitrite NEGATIVE NEGATIVE   Leukocytes, UA NEGATIVE NEGATIVE  Urine microscopic-add on     Status: None   Collection Time: 04/04/15 11:09 AM  Result Value Ref Range   Squamous Epithelial / LPF RARE RARE   RBC / HPF 11-20 <3 RBC/hpf    Dg Chest 2 View  04/04/2015   CLINICAL DATA:  Generalize weakness, confusion, hypertension and rapid atrial fibrillation.  EXAM: CHEST - 2 VIEW  COMPARISON:  10/04/2014  FINDINGS: Stable mild cardiac enlargement. There is no evidence of pulmonary edema, consolidation, pneumothorax, nodule or pleural fluid. Stable mild spondylosis of the thoracic spine.  IMPRESSION: Stable mild cardiac enlargement.  No active disease.   Electronically Signed   By: Aletta Edouard M.D.   On: 04/04/2015 10:40   Ct Head Wo Contrast  04/04/2015   CLINICAL DATA:  Generalize weakness, headache and confusion. History of hypertension and atrial fibrillation with rapid ventricular response.  EXAM: CT HEAD WITHOUT CONTRAST  TECHNIQUE: Contiguous axial images were obtained from the base of the skull through the vertex without intravenous  contrast.  COMPARISON:  02/12/2009  FINDINGS: There is evidence of acute hemorrhage with a large parenchymal hematoma present centered just superior to the frontal horn of the right lateral ventricle, likely in the region of the caudate nucleus with rupture of acute blood into the frontal foreign of the lateral ventricle and blood also seen within the third ventricle. The parenchymal hemorrhage measures roughly 3.2 x 4.6 x 3.0 cm in dimensions. There is some surrounding edema and associated asymmetric dilatation of the right lateral ventricle compared to the left consistent with trapping of the ventricle developing hydrocephalus. Some layering blood is also noted in the temporal horn of the right lateral ventricle. Mild shift of the midline is noted to the left of approximately 5 mm. No subarachnoid or extra-axial hemorrhage identified. No evidence of cerebral infarction. The skull is unremarkable.  IMPRESSION: Large acute hemorrhage centered in the region of the right caudate nucleus with rupture into the lateral ventricle and evidence of developing hydrocephalus and trapping of the right lateral ventricle. There is a mild amount of midline shift to the left. Parenchymal hemorrhage measures roughly 3.2 x 4.6 x 3.0 cm. This is most likely a hypertensive parenchymal bleed.  These results were called by telephone at the time of interpretation on 04/04/2015 at 11:09 am to [facility], PA-C , who verbally acknowledged these results.   Electronically Signed   By: Aletta Edouard M.D.   On: 04/04/2015 11:12    Review of Systems  Constitutional: Negative.   HENT: Negative.   Eyes: Negative.   Respiratory: Negative.   Cardiovascular: Negative.  Gastrointestinal: Negative.   Genitourinary: Negative.   Musculoskeletal: Negative.   Skin: Negative.   Neurological: Negative.   Endo/Heme/Allergies: Negative.   Psychiatric/Behavioral: Negative.    Blood pressure 166/103, pulse 93, temperature 98.3 F (36.8 C),  temperature source Oral, resp. rate 24, SpO2 100 %. Physical Exam  Constitutional: She is oriented to person, place, and time. She appears well-developed and well-nourished.  HENT:  Head: Normocephalic.  Eyes: Pupils are equal, round, and reactive to light.  Neck: Normal range of motion.  Respiratory: Effort normal.  GI: Soft.  Neurological: She is alert and oriented to person, place, and time. GCS eye subscore is 4. GCS verbal subscore is 5. GCS motor subscore is 6.  Patient is awake and alert she is oriented 3 had some confusion noted throughout the morning. Moves all extremities well with 5 out of 5 strength and no evidence of pronator drift. Pupils are equal and reactive to light Cranial nerves are intact.    Assessment/Plan: 81 year old female with a right basal ganglia and digital hemorrhage with interventricular extension in a mild ventricular megaly. Currently the patient is awake and alert she is on Coumadin and INR is pending we have initiated the rapid reversal protocol will have the patient admitted to neurology will follow with serial CT scans patient does not need external ventricular drain placement at this time. Certainly we could not put that until her coagulopathy was reversed. The intracerebral hemorrhage is nonoperative.  Larraine Argo P 04/04/2015, 12:12 PM

## 2015-04-04 NOTE — ED Notes (Signed)
Pt taken to radiology

## 2015-04-05 ENCOUNTER — Encounter (HOSPITAL_COMMUNITY): Payer: Self-pay

## 2015-04-05 ENCOUNTER — Inpatient Hospital Stay (HOSPITAL_COMMUNITY): Payer: Medicare Other

## 2015-04-05 DIAGNOSIS — G911 Obstructive hydrocephalus: Secondary | ICD-10-CM | POA: Diagnosis not present

## 2015-04-05 DIAGNOSIS — E785 Hyperlipidemia, unspecified: Secondary | ICD-10-CM | POA: Diagnosis not present

## 2015-04-05 DIAGNOSIS — F329 Major depressive disorder, single episode, unspecified: Secondary | ICD-10-CM | POA: Diagnosis not present

## 2015-04-05 DIAGNOSIS — D6832 Hemorrhagic disorder due to extrinsic circulating anticoagulants: Secondary | ICD-10-CM | POA: Diagnosis not present

## 2015-04-05 DIAGNOSIS — I615 Nontraumatic intracerebral hemorrhage, intraventricular: Secondary | ICD-10-CM | POA: Diagnosis not present

## 2015-04-05 DIAGNOSIS — I714 Abdominal aortic aneurysm, without rupture: Secondary | ICD-10-CM | POA: Diagnosis not present

## 2015-04-05 DIAGNOSIS — Z7901 Long term (current) use of anticoagulants: Secondary | ICD-10-CM | POA: Diagnosis not present

## 2015-04-05 DIAGNOSIS — Z833 Family history of diabetes mellitus: Secondary | ICD-10-CM | POA: Diagnosis not present

## 2015-04-05 DIAGNOSIS — G936 Cerebral edema: Secondary | ICD-10-CM

## 2015-04-05 DIAGNOSIS — N183 Chronic kidney disease, stage 3 (moderate): Secondary | ICD-10-CM | POA: Diagnosis not present

## 2015-04-05 DIAGNOSIS — F419 Anxiety disorder, unspecified: Secondary | ICD-10-CM | POA: Diagnosis not present

## 2015-04-05 DIAGNOSIS — M109 Gout, unspecified: Secondary | ICD-10-CM | POA: Diagnosis not present

## 2015-04-05 DIAGNOSIS — Z85038 Personal history of other malignant neoplasm of large intestine: Secondary | ICD-10-CM | POA: Diagnosis not present

## 2015-04-05 DIAGNOSIS — I4891 Unspecified atrial fibrillation: Secondary | ICD-10-CM | POA: Diagnosis not present

## 2015-04-05 DIAGNOSIS — E876 Hypokalemia: Secondary | ICD-10-CM | POA: Diagnosis not present

## 2015-04-05 DIAGNOSIS — I5022 Chronic systolic (congestive) heart failure: Secondary | ICD-10-CM | POA: Diagnosis not present

## 2015-04-05 DIAGNOSIS — E1122 Type 2 diabetes mellitus with diabetic chronic kidney disease: Secondary | ICD-10-CM | POA: Diagnosis not present

## 2015-04-05 DIAGNOSIS — E039 Hypothyroidism, unspecified: Secondary | ICD-10-CM | POA: Diagnosis not present

## 2015-04-05 DIAGNOSIS — K219 Gastro-esophageal reflux disease without esophagitis: Secondary | ICD-10-CM | POA: Diagnosis not present

## 2015-04-05 DIAGNOSIS — D649 Anemia, unspecified: Secondary | ICD-10-CM | POA: Diagnosis not present

## 2015-04-05 DIAGNOSIS — Z881 Allergy status to other antibiotic agents status: Secondary | ICD-10-CM | POA: Diagnosis not present

## 2015-04-05 DIAGNOSIS — E1142 Type 2 diabetes mellitus with diabetic polyneuropathy: Secondary | ICD-10-CM | POA: Diagnosis not present

## 2015-04-05 DIAGNOSIS — I129 Hypertensive chronic kidney disease with stage 1 through stage 4 chronic kidney disease, or unspecified chronic kidney disease: Secondary | ICD-10-CM | POA: Diagnosis not present

## 2015-04-05 DIAGNOSIS — Z8249 Family history of ischemic heart disease and other diseases of the circulatory system: Secondary | ICD-10-CM | POA: Diagnosis not present

## 2015-04-05 DIAGNOSIS — Z888 Allergy status to other drugs, medicaments and biological substances status: Secondary | ICD-10-CM | POA: Diagnosis not present

## 2015-04-05 LAB — VITAMIN B12: VITAMIN B 12: 165 pg/mL — AB (ref 180–914)

## 2015-04-05 LAB — CBC
HEMATOCRIT: 34.2 % — AB (ref 36.0–46.0)
Hemoglobin: 11.7 g/dL — ABNORMAL LOW (ref 12.0–15.0)
MCH: 26.2 pg (ref 26.0–34.0)
MCHC: 34.2 g/dL (ref 30.0–36.0)
MCV: 76.5 fL — ABNORMAL LOW (ref 78.0–100.0)
Platelets: 291 10*3/uL (ref 150–400)
RBC: 4.47 MIL/uL (ref 3.87–5.11)
RDW: 16.9 % — AB (ref 11.5–15.5)
WBC: 7.5 10*3/uL (ref 4.0–10.5)

## 2015-04-05 LAB — BASIC METABOLIC PANEL
Anion gap: 8 (ref 5–15)
BUN: 10 mg/dL (ref 6–20)
CALCIUM: 8.9 mg/dL (ref 8.9–10.3)
CO2: 26 mmol/L (ref 22–32)
CREATININE: 1.1 mg/dL — AB (ref 0.44–1.00)
Chloride: 110 mmol/L (ref 101–111)
GFR calc non Af Amer: 45 mL/min — ABNORMAL LOW (ref >60)
GFR, EST AFRICAN AMERICAN: 53 mL/min — AB (ref >60)
GLUCOSE: 139 mg/dL — AB (ref 65–99)
Potassium: 3.3 mmol/L — ABNORMAL LOW (ref 3.5–5.1)
Sodium: 144 mmol/L (ref 135–145)

## 2015-04-05 LAB — GLUCOSE, CAPILLARY
GLUCOSE-CAPILLARY: 138 mg/dL — AB (ref 65–99)
Glucose-Capillary: 109 mg/dL — ABNORMAL HIGH (ref 65–99)
Glucose-Capillary: 116 mg/dL — ABNORMAL HIGH (ref 65–99)
Glucose-Capillary: 140 mg/dL — ABNORMAL HIGH (ref 65–99)
Glucose-Capillary: 142 mg/dL — ABNORMAL HIGH (ref 65–99)
Glucose-Capillary: 177 mg/dL — ABNORMAL HIGH (ref 65–99)

## 2015-04-05 LAB — LIPID PANEL
Cholesterol: 161 mg/dL (ref 0–200)
HDL: 43 mg/dL (ref >40)
LDL CALC: 97 mg/dL (ref 0–99)
TRIGLYCERIDES: 104 mg/dL (ref <150)
Total CHOL/HDL Ratio: 3.7 RATIO
VLDL: 21 mg/dL (ref 0–40)

## 2015-04-05 LAB — TSH: TSH: 2.053 u[IU]/mL (ref 0.350–4.500)

## 2015-04-05 LAB — PROTIME-INR
INR: 1.28 (ref 0.00–1.49)
Prothrombin Time: 16.1 seconds — ABNORMAL HIGH (ref 11.6–15.2)

## 2015-04-05 MED ORDER — CLEVIDIPINE BUTYRATE 0.5 MG/ML IV EMUL
0.0000 mg/h | INTRAVENOUS | Status: DC
  Filled 2015-04-05: qty 50

## 2015-04-05 MED ORDER — ALLOPURINOL 100 MG PO TABS
100.0000 mg | ORAL_TABLET | Freq: Every day | ORAL | Status: DC
  Administered 2015-04-05 – 2015-04-12 (×6): 100 mg via ORAL
  Filled 2015-04-05 (×8): qty 1

## 2015-04-05 MED ORDER — METOPROLOL TARTRATE 25 MG PO TABS
25.0000 mg | ORAL_TABLET | Freq: Two times a day (BID) | ORAL | Status: DC
  Administered 2015-04-05: 25 mg via ORAL
  Filled 2015-04-05 (×3): qty 1

## 2015-04-05 MED ORDER — DILTIAZEM HCL ER COATED BEADS 240 MG PO CP24
240.0000 mg | ORAL_CAPSULE | Freq: Every day | ORAL | Status: DC
  Administered 2015-04-05 – 2015-04-12 (×5): 240 mg via ORAL
  Filled 2015-04-05 (×8): qty 1

## 2015-04-05 MED ORDER — LEVOTHYROXINE SODIUM 50 MCG PO TABS
50.0000 ug | ORAL_TABLET | Freq: Every day | ORAL | Status: DC
  Administered 2015-04-06 – 2015-04-12 (×6): 50 ug via ORAL
  Filled 2015-04-05 (×10): qty 1

## 2015-04-05 MED ORDER — PANTOPRAZOLE SODIUM 40 MG PO TBEC
40.0000 mg | DELAYED_RELEASE_TABLET | Freq: Every day | ORAL | Status: DC
  Administered 2015-04-05 – 2015-04-12 (×5): 40 mg via ORAL
  Filled 2015-04-05 (×6): qty 1

## 2015-04-05 MED ORDER — INSULIN ASPART 100 UNIT/ML ~~LOC~~ SOLN
0.0000 [IU] | Freq: Three times a day (TID) | SUBCUTANEOUS | Status: DC
  Administered 2015-04-05: 2 [IU] via SUBCUTANEOUS
  Administered 2015-04-06: 3 [IU] via SUBCUTANEOUS
  Administered 2015-04-06: 2 [IU] via SUBCUTANEOUS
  Administered 2015-04-07 (×2): 3 [IU] via SUBCUTANEOUS
  Administered 2015-04-07 – 2015-04-09 (×5): 2 [IU] via SUBCUTANEOUS
  Administered 2015-04-09 – 2015-04-10 (×3): 3 [IU] via SUBCUTANEOUS
  Administered 2015-04-10 – 2015-04-11 (×3): 2 [IU] via SUBCUTANEOUS
  Administered 2015-04-11: 3 [IU] via SUBCUTANEOUS
  Administered 2015-04-12: 2 [IU] via SUBCUTANEOUS
  Administered 2015-04-12: 3 [IU] via SUBCUTANEOUS

## 2015-04-05 NOTE — Progress Notes (Signed)
PT Cancellation Note  Patient Details Name: Nicole [name] MRN: [id number] DOB: 07-Jul-1931   Cancelled Treatment:    Reason Eval/Treat Not Completed: Patient not medically ready.  Is still on bedrest per RN.  Await activity orders.  Will see as soon as able. 04/05/2015  Donnella Sham, [place] [phone]  (pager)   [name], [name] 04/05/2015, 12:45 PM

## 2015-04-05 NOTE — Clinical Documentation Improvement (Signed)
Neurology  Can the diagnosis of CKD be further specified?  CKD Stage I - GFR greater than or equal to 90  CKD Stage II - GFR 60-89  CKD Stage III - GFR 30-59  CKD Stage IV - GFR 15-29  CKD Stage V - GFR < 15  ESRD (End Stage Renal Disease)  Other condition  Unable to clinically determine  Supporting Information: :  04/04/15 per H&P..."CKD (chronic kidney disease)"... TX: trend BMP;  0.9 % sodium chloride infusion   50 mL/hr  Ref. Range 04/04/2015 09:45 04/05/2015 08:19  EGFR (African American) Latest Ref Range: >60 mL/min 41 (L) 53 (L)   Please exercise your independent, professional judgment when responding. A specific answer is not anticipated or expected.  Thank You, Ermelinda Das, RN, BSN, CCDS Certified Clinical Documentation Specialist Pager: [place]: Health Information Management

## 2015-04-05 NOTE — Progress Notes (Signed)
Subjective: Patient reports Severe headache but otherwise no nausea  Objective: Vital signs in last 24 hours: Temp:  [97.3 F (36.3 C)-98.9 F (37.2 C)] 98.1 F (36.7 C) (09/13 0400) Pulse Rate:  [66-137] 72 (09/13 0300) Resp:  [15-32] 18 (09/13 0300) BP: (129-183)/(70-112) 168/88 mmHg (09/13 0300) SpO2:  [93 %-100 %] 98 % (09/13 0300) Weight:  [76 kg (167 lb 8.8 oz)-79.379 kg (175 lb)] 76 kg (167 lb 8.8 oz) (09/12 1400)  Intake/Output from previous day: 09/12 0701 - 09/13 0700 In: 762.5 [I.V.:762.5] Out: 600 [Urine:600] Intake/Output this shift: Total I/O In: 600 [I.V.:600] Out: 500 [Urine:500]  Awake alert oriented strength 5 out of 5 without pronator drift.  Lab Results:  Recent Labs  04/04/15 0945  WBC 7.1  HGB 12.8  HCT 37.4  PLT 329   BMET  Recent Labs  04/04/15 0945  NA 142  K 3.8  CL 106  CO2 24  GLUCOSE 198*  BUN 14  CREATININE 1.35*  CALCIUM 9.1    Studies/Results: Dg Chest 2 View  04/04/2015   CLINICAL DATA:  Generalize weakness, confusion, hypertension and rapid atrial fibrillation.  EXAM: CHEST - 2 VIEW  COMPARISON:  10/04/2014  FINDINGS: Stable mild cardiac enlargement. There is no evidence of pulmonary edema, consolidation, pneumothorax, nodule or pleural fluid. Stable mild spondylosis of the thoracic spine.  IMPRESSION: Stable mild cardiac enlargement.  No active disease.   Electronically Signed   By: Aletta Edouard M.D.   On: 04/04/2015 10:40   Ct Head Wo Contrast  04/04/2015   CLINICAL DATA:  Generalize weakness, headache and confusion. History of hypertension and atrial fibrillation with rapid ventricular response.  EXAM: CT HEAD WITHOUT CONTRAST  TECHNIQUE: Contiguous axial images were obtained from the base of the skull through the vertex without intravenous contrast.  COMPARISON:  02/12/2009  FINDINGS: There is evidence of acute hemorrhage with a large parenchymal hematoma present centered just superior to the frontal horn of the right  lateral ventricle, likely in the region of the caudate nucleus with rupture of acute blood into the frontal foreign of the lateral ventricle and blood also seen within the third ventricle. The parenchymal hemorrhage measures roughly 3.2 x 4.6 x 3.0 cm in dimensions. There is some surrounding edema and associated asymmetric dilatation of the right lateral ventricle compared to the left consistent with trapping of the ventricle developing hydrocephalus. Some layering blood is also noted in the temporal horn of the right lateral ventricle. Mild shift of the midline is noted to the left of approximately 5 mm. No subarachnoid or extra-axial hemorrhage identified. No evidence of cerebral infarction. The skull is unremarkable.  IMPRESSION: Large acute hemorrhage centered in the region of the right caudate nucleus with rupture into the lateral ventricle and evidence of developing hydrocephalus and trapping of the right lateral ventricle. There is a mild amount of midline shift to the left. Parenchymal hemorrhage measures roughly 3.2 x 4.6 x 3.0 cm. This is most likely a hypertensive parenchymal bleed.  These results were called by telephone at the time of interpretation on 04/04/2015 at 11:09 am to [facility], PA-C , who verbally acknowledged these results.   Electronically Signed   By: Aletta Edouard M.D.   On: 04/04/2015 11:12    Assessment/Plan: Continue to mobilize with physical occupational therapy today per neurology head CT without contrast pending.  LOS: 1 day     Byrne Capek P 04/05/2015, 5:38 AM

## 2015-04-05 NOTE — Progress Notes (Addendum)
STROKE TEAM PROGRESS NOTE   SUBJECTIVE (INTERVAL HISTORY) Her family is at the bedside.  Overall she feels her condition is stable. She is awake alert and just came back from CT which is stable comparing with yesterday. Her BP is at 180s and she received labetalol iv. She passed swallow and will put on po BP meds.   OBJECTIVE Temp:  [97.3 F (36.3 C)-98.9 F (37.2 C)] 98.2 F (36.8 C) (09/13 0823) Pulse Rate:  [66-137] 71 (09/13 0800) Cardiac Rhythm:  [-] Atrial fibrillation (09/12 2000) Resp:  [14-32] 23 (09/13 0800) BP: (129-183)/(70-112) 154/91 mmHg (09/13 0800) SpO2:  [93 %-100 %] 94 % (09/13 0800) Weight:  [167 lb 8.8 oz (76 kg)-175 lb (79.379 kg)] 167 lb 8.8 oz (76 kg) (09/12 1400)   Recent Labs Lab 04/04/15 1508 04/04/15 2026 04/04/15 2331 04/05/15 0345 04/05/15 0821  GLUCAP 154* 140* 116* 138* 142*    Recent Labs Lab 04/04/15 0945 04/05/15 0819  NA 142 144  K 3.8 3.3*  CL 106 110  CO2 24 26  GLUCOSE 198* 139*  BUN 14 10  CREATININE 1.35* 1.10*  CALCIUM 9.1 8.9    Recent Labs Lab 04/04/15 0945  AST 22  ALT 10*  ALKPHOS 91  BILITOT 1.0  PROT 7.8  ALBUMIN 3.6    Recent Labs Lab 04/04/15 0945 04/05/15 0819  WBC 7.1 7.5  NEUTROABS 6.1  --   HGB 12.8 11.7*  HCT 37.4 34.2*  MCV 77.1* 76.5*  PLT 329 291   No results for input(s): CKTOTAL, CKMB, CKMBINDEX, TROPONINI in the last 168 hours.  Recent Labs  04/04/15 1200 04/04/15 1606 04/04/15 2010 04/05/15 0228  LABPROT 31.0* 17.3* 17.1* 16.1*  INR 3.06* 1.40 1.38 1.28    Recent Labs  04/04/15 1109  COLORURINE YELLOW  LABSPEC 1.015  PHURINE 7.0  GLUCOSEU 100*  HGBUR MODERATE*  BILIRUBINUR NEGATIVE  KETONESUR 15*  PROTEINUR >300*  UROBILINOGEN 1.0  NITRITE NEGATIVE  LEUKOCYTESUR NEGATIVE       Component Value Date/Time   CHOL 161 04/05/2015 0819   CHOL 176 02/17/2015 0810   TRIG 104 04/05/2015 0819   HDL 43 04/05/2015 0819   HDL 70 02/17/2015 0810   CHOLHDL 3.7 04/05/2015  0819   CHOLHDL 2.5 02/17/2015 0810   VLDL 21 04/05/2015 0819   LDLCALC 97 04/05/2015 0819   LDLCALC 92 02/17/2015 0810   Lab Results  Component Value Date   HGBA1C 5.8* 02/17/2015   No results found for: LABOPIA, COCAINSCRNUR, LABBENZ, AMPHETMU, THCU, LABBARB  No results for input(s): ETH in the last 168 hours.  I have personally reviewed the radiological images below and agree with the radiology interpretations.  Dg Chest 2 View  04/04/2015    IMPRESSION: Stable mild cardiac enlargement.  No active disease.     Ct Head Wo Contrast  04/05/2015   IMPRESSION: Size stable ~ 4 cm right basal ganglia hematoma. Intraventricular hemorrhage has redistributed, accounting for newly seen trace subarachnoid blood. Obstructive right lateral ventriculomegaly which is stable.    04/04/2015   IMPRESSION: Large acute hemorrhage centered in the region of the right caudate nucleus with rupture into the lateral ventricle and evidence of developing hydrocephalus and trapping of the right lateral ventricle. There is a mild amount of midline shift to the left. Parenchymal hemorrhage measures roughly 3.2 x 4.6 x 3.0 cm. This is most likely a hypertensive parenchymal bleed.    2D Echocardiogram  pending  EKG  atrial fibrillation, rate 82. For complete  results please see formal report.  PHYSICAL EXAM  Temp:  [97.3 F (36.3 C)-98.9 F (37.2 C)] 98.2 F (36.8 C) (09/13 0823) Pulse Rate:  [66-137] 71 (09/13 0800) Resp:  [14-32] 23 (09/13 0800) BP: (129-183)/(70-112) 154/91 mmHg (09/13 0800) SpO2:  [93 %-100 %] 94 % (09/13 0800) Weight:  [167 lb 8.8 oz (76 kg)-175 lb (79.379 kg)] 167 lb 8.8 oz (76 kg) (09/12 1400)  General - Well nourished, well developed, in no apparent distress.  Ophthalmologic - fundi not visualized due to noncooperation.  Cardiovascular - irregularly irregular heart rate and rhythm.  Mental Status -  Level of arousal and orientation to place and people was intact, but not  orientated to time or age. Language including expression, naming, repetition, comprehension was assessed and found intact. Fund of Knowledge was assessed and was impaired.  Cranial Nerves II - XII - II - Visual field intact OU. III, IV, VI - Extraocular movements intact. V - Facial sensation intact bilaterally. VII - right nasolabial fold flattening. VIII - Hearing & vestibular intact bilaterally. X - Palate elevates symmetrically. XI - Chin turning & shoulder shrug intact bilaterally. XII - Tongue protrusion intact.  Motor Strength - The patient's strength was 4+/5 in all extremities and pronator drift was absent.  Bulk was normal and fasciculations were absent.   Motor Tone - Muscle tone was assessed at the neck and appendages and was normal.  Reflexes - The patient's reflexes were symmetrical in all extremities and she had no pathological reflexes.  Sensory - Light touch, temperature/pinprick were assessed and were symmetrical.    Coordination - The patient had normal movements in the hands with no ataxia or dysmetria.  Tremor was absent.  Gait and Station - deferred due to safety concerns.   ASSESSMENT/PLAN Nicole [name] is a 81 y.o. female with history of HTN, DM, CHF, HLD, afib on coumadin, and CKD admitted for right caudate head ICH with IVH. Symptoms stable.    ICH:  Right caudate head with ventricular extension likely secondary to HTN  CT repeat showed stable hematoma and obstructive right lateral ventriculomegaly   NSG Dr. Saintclair Halsted on board and no surgical intervention at this time  2D Echo  pending  LDL 97  HgbA1c pending  SCDs for VTE prophylaxis  Diet Heart Room service appropriate?: Yes; Fluid consistency:: Thin   warfarin prior to admission, now on no antithrombotic  Ongoing aggressive stroke risk factor management  Therapy recommendations:  pending  Disposition:  Pending  Obstructive hydrocephalus  Right ventriculomagely  NSG following  No  indication for intervention now  Afib on coumadin at home  Still has afib rhythm on tele  On cardizam and metoprolol at home  Hold off coumadin  Rate controlled  Diabetes  HgbA1c pending goal < 7.0  Controlled  CBG monitoring  SSI  Hypertension  Home meds:   cardizem and metoprolol BP goal < 160. Currently on home meds, and labetalol PRN  Unstable  Hyperlipidemia  Home meds:  zocor 40   Currently on none due to [place]  LDL 97, goal < 70  Resume statin at discharge  CKD, stage III  Monitor Cre  Avoid nephrotoxic agent  Other Stroke Risk Factors  Advanced age  Other Active Problems  Mild anemia  hypokalemia  Other Pertinent History    Hospital day # 1  This patient is critically ill due to [place] with IVH and hydrocephalus and afib not on AC and at significant risk of neurological  worsening, death form cerebral edema, recurrent ICH, obstructive hydrocephalus, brain herniation, ischemic stroke due to afib not on AC. This patient's care requires constant monitoring of vital signs, hemodynamics, respiratory and cardiac monitoring, review of multiple databases, neurological assessment, discussion with family, other specialists and medical decision making of high complexity. I spent 40 minutes of neurocritical care time in the care of this patient.   Rosalin Hawking, MD PhD Stroke Neurology 04/05/2015 9:24 AM    To contact Stroke Continuity provider, please refer to [email or web address] After hours, contact General Neurology

## 2015-04-05 NOTE — Progress Notes (Signed)
Patient ID: Nicole [name], female   DOB: 30-Jul-1930, 81 y.o.   MRN: [id number] CT scan stable to slightly improved no hydrocephalus

## 2015-04-06 LAB — CBC
HEMATOCRIT: 34 % — AB (ref 36.0–46.0)
Hemoglobin: 11.6 g/dL — ABNORMAL LOW (ref 12.0–15.0)
MCH: 26.1 pg (ref 26.0–34.0)
MCHC: 34.1 g/dL (ref 30.0–36.0)
MCV: 76.6 fL — AB (ref 78.0–100.0)
PLATELETS: 270 10*3/uL (ref 150–400)
RBC: 4.44 MIL/uL (ref 3.87–5.11)
RDW: 17 % — AB (ref 11.5–15.5)
WBC: 6.9 10*3/uL (ref 4.0–10.5)

## 2015-04-06 LAB — BASIC METABOLIC PANEL
Anion gap: 8 (ref 5–15)
BUN: 11 mg/dL (ref 6–20)
CALCIUM: 8.9 mg/dL (ref 8.9–10.3)
CO2: 24 mmol/L (ref 22–32)
Chloride: 111 mmol/L (ref 101–111)
Creatinine, Ser: 1.32 mg/dL — ABNORMAL HIGH (ref 0.44–1.00)
GFR calc Af Amer: 42 mL/min — ABNORMAL LOW (ref >60)
GFR, EST NON AFRICAN AMERICAN: 36 mL/min — AB (ref >60)
GLUCOSE: 130 mg/dL — AB (ref 65–99)
Potassium: 3.2 mmol/L — ABNORMAL LOW (ref 3.5–5.1)
Sodium: 143 mmol/L (ref 135–145)

## 2015-04-06 LAB — HEMOGLOBIN A1C
HEMOGLOBIN A1C: 5.9 % — AB (ref 4.8–5.6)
MEAN PLASMA GLUCOSE: 123 mg/dL

## 2015-04-06 LAB — URINALYSIS W MICROSCOPIC (NOT AT ARMC)
BILIRUBIN URINE: NEGATIVE
GLUCOSE, UA: 100 mg/dL — AB
KETONES UR: NEGATIVE mg/dL
LEUKOCYTES UA: NEGATIVE
NITRITE: NEGATIVE
PH: 6.5 (ref 5.0–8.0)
PROTEIN: 100 mg/dL — AB
Specific Gravity, Urine: 1.013 (ref 1.005–1.030)
UROBILINOGEN UA: 1 mg/dL (ref 0.0–1.0)

## 2015-04-06 LAB — GLUCOSE, CAPILLARY
GLUCOSE-CAPILLARY: 149 mg/dL — AB (ref 65–99)
Glucose-Capillary: 175 mg/dL — ABNORMAL HIGH (ref 65–99)
Glucose-Capillary: 163 mg/dL — ABNORMAL HIGH (ref 65–99)

## 2015-04-06 MED ORDER — METOPROLOL TARTRATE 25 MG PO TABS
25.0000 mg | ORAL_TABLET | Freq: Two times a day (BID) | ORAL | Status: DC
  Administered 2015-04-06 – 2015-04-12 (×10): 25 mg via ORAL
  Filled 2015-04-06 (×12): qty 1

## 2015-04-06 MED ORDER — CLONIDINE HCL 0.1 MG PO TABS
0.1000 mg | ORAL_TABLET | Freq: Three times a day (TID) | ORAL | Status: DC
  Administered 2015-04-06 (×3): 0.1 mg via ORAL
  Filled 2015-04-06 (×3): qty 1

## 2015-04-06 MED ORDER — METOPROLOL TARTRATE 50 MG PO TABS
50.0000 mg | ORAL_TABLET | Freq: Two times a day (BID) | ORAL | Status: DC
  Administered 2015-04-06: 50 mg via ORAL

## 2015-04-06 NOTE — Evaluation (Signed)
Physical Therapy Evaluation Patient Details Name: Nicole [name] MRN: [id number] DOB: February 19, 1931 Today's Date: 04/06/2015   History of Present Illness  Nicole [name] is an 81 y.o. female history of atrial fibrillation on Coumadin, hypertension, hyperlipidemia, diabetes mellitus, CHF and chronic kidney disease, brought to the emergency room after being found on the floor home this morning. She was noted to be somewhat confused last night. She complained of feeling of wooziness and slight headache at 3:30 PM yesterday. She has no previous history of stroke nor TIA. CT scan of her head showed fairly large intracerebral hemorrhage involving the right caudate and extending into right ventricle with mass effect with mild midline shift as well as right ventricular enlargement. She has been evaluated by Dr. Saintclair Halsted of the Neurosurgery Service. Ventricular drain is not felt to be warranted at this time.  Clinical Impression  Pt admitted with/for ich with mild midline shift.  Pt currently limited functionally due to the problems listed. ( See problems list.)   Pt will benefit from PT to maximize function and safety in order to get ready for next venue listed below.     Follow Up Recommendations CIR;Supervision/Assistance - 24 hour (pt's family feel that they can provide up to 24 hour assist)    Equipment Recommendations  None recommended by PT    Recommendations for Other Services Rehab consult     Precautions / Restrictions Precautions Precautions: Fall      Mobility  Bed Mobility Overal bed mobility: Needs Assistance Bed Mobility: Supine to Sit     Supine to sit: Min assist     General bed mobility comments: cues for direction and alternative ways to scoot and extra time.  Transfers Overall transfer level: Needs assistance   Transfers: Sit to/from Stand;Stand Pivot Transfers Sit to Stand: Min assist Stand pivot transfers: Min assist       General transfer comment: pt supports  herself on the bed rail and is stiff, and guarded with transfer  Ambulation/Gait Ambulation/Gait assistance: Min assist Ambulation Distance (Feet): 90 Feet Assistive device: 1 person hand held assist Gait Pattern/deviations: Step-through pattern;Decreased step length - right;Decreased step length - left;Decreased stride length;Narrow base of support   Gait velocity interpretation: Below normal speed for age/gender General Gait Details: unsteady gait with small, tentative steps, higher guard postition with R UE.  Not scanning her environment  Stairs            Wheelchair Mobility    Modified Rankin (Stroke Patients Only) Modified Rankin (Stroke Patients Only) Pre-Morbid Rankin Score: No symptoms Modified Rankin: Moderately severe disability     Balance Overall balance assessment: Needs assistance Sitting-balance support: Feet supported;Bilateral upper extremity supported Sitting balance-Leahy Scale: Fair Sitting balance - Comments: doesn't accept challenge Postural control: Posterior lean Standing balance support: No upper extremity supported;Single extremity supported Standing balance-Leahy Scale: Fair Standing balance comment: tends to search for external support                             Pertinent Vitals/Pain Pain Assessment: No/denies pain    Home Living Family/patient expects to be discharged to:: Private residence Living Arrangements: Alone Available Help at Discharge: Family;Available 24 hours/day Type of Home: House Home Access: Stairs to enter Entrance Stairs-Rails: Right Entrance Stairs-Number of Steps: 2 Home Layout: One level Home Equipment: Cane - single point Additional Comments: pt drove and generally did for herself    Prior Function Level of Independence: Independent  Hand Dominance        Extremity/Trunk Assessment   Upper Extremity Assessment: Defer to OT evaluation (used her arms appropriately to scoot  to EOB)           Lower Extremity Assessment: Overall WFL for tasks assessed (mild proximal weakness)         Communication   Communication: No difficulties  Cognition Arousal/Alertness: Lethargic Behavior During Therapy: Flat affect Overall Cognitive Status: Within Functional Limits for tasks assessed                      General Comments      Exercises        Assessment/Plan    PT Assessment Patient needs continued PT services  PT Diagnosis Difficulty walking   PT Problem List Decreased strength;Decreased activity tolerance;Decreased balance;Decreased mobility;Decreased coordination  PT Treatment Interventions DME instruction;Gait training;Stair training;Functional mobility training;Therapeutic activities;Balance training;Patient/family education;Neuromuscular re-education   PT Goals (Current goals can be found in the Care Plan section) Acute Rehab PT Goals Patient Stated Goal: I want to go home PT Goal Formulation: With patient Time For Goal Achievement: 04/20/15 Potential to Achieve Goals: Good    Frequency Min 3X/week   Barriers to discharge        Co-evaluation               End of Session   Activity Tolerance: Patient tolerated treatment well Patient left: in chair;with call bell/phone within reach;with family/visitor present Nurse Communication: Mobility status         Time: 7544-9201 PT Time Calculation (min) (ACUTE ONLY): 33 min   Charges:   PT Evaluation $Initial PT Evaluation Tier I: 1 Procedure PT Treatments $Gait Training: 8-22 mins   PT G Codes:        Samari Gorby, [name] 04/06/2015, 12:40 PM  04/06/2015  Donnella Sham, PT [phone]  (pager)

## 2015-04-06 NOTE — Progress Notes (Signed)
OT Cancellation Note  Patient Details Name: Nicole [name] MRN: [id number] DOB: 06-Dec-1930   Cancelled Treatment:    Reason Eval/Treat Not Completed: Patient not medically ready Pt currently on bedrest. Please update activity orders when appropriate for therapy. Thanks Gueydan, OTR/L  [phone] 04/06/2015 04/06/2015, 8:31 AM

## 2015-04-06 NOTE — Progress Notes (Signed)
Rehab Admissions Coordinator Note:  Patient was screened by Cleatrice Burke for appropriateness for an Inpatient Acute Rehab Consult per PT recommendation. At this time, we are recommending Inpatient Rehab consult when medically appropriate. I will follow.   Sadeel, Fiddler 04/06/2015, 2:35 PM  I can be reached at [phone].

## 2015-04-06 NOTE — Progress Notes (Signed)
STROKE TEAM PROGRESS NOTE   SUBJECTIVE (INTERVAL HISTORY) No family is at the bedside.  Overall she feels her condition is stable. BP still on the high side, got labetalol PRN. Will increase PO meds.    OBJECTIVE Temp:  [97.9 F (36.6 C)-98.7 F (37.1 C)] 98.2 F (36.8 C) (09/14 1537) Pulse Rate:  [41-88] 55 (09/14 1500) Cardiac Rhythm:  [-] Atrial fibrillation (09/14 0800) Resp:  [15-31] 21 (09/14 1500) BP: (120-173)/(55-118) 120/56 mmHg (09/14 1500) SpO2:  [92 %-100 %] 97 % (09/14 1500)   Recent Labs Lab 04/05/15 1200 04/05/15 1734 04/05/15 2156 04/06/15 0822 04/06/15 1158  GLUCAP 140* 109* 177* 149* 175*    Recent Labs Lab 04/04/15 0945 04/05/15 0819 04/06/15 0217  NA 142 144 143  K 3.8 3.3* 3.2*  CL 106 110 111  CO2 24 26 24   GLUCOSE 198* 139* 130*  BUN 14 10 11   CREATININE 1.35* 1.10* 1.32*  CALCIUM 9.1 8.9 8.9    Recent Labs Lab 04/04/15 0945  AST 22  ALT 10*  ALKPHOS 91  BILITOT 1.0  PROT 7.8  ALBUMIN 3.6    Recent Labs Lab 04/04/15 0945 04/05/15 0819 04/06/15 0217  WBC 7.1 7.5 6.9  NEUTROABS 6.1  --   --   HGB 12.8 11.7* 11.6*  HCT 37.4 34.2* 34.0*  MCV 77.1* 76.5* 76.6*  PLT 329 291 270   No results for input(s): CKTOTAL, CKMB, CKMBINDEX, TROPONINI in the last 168 hours.  Recent Labs  04/04/15 1200 04/04/15 1606 04/04/15 2010 04/05/15 0228  LABPROT 31.0* 17.3* 17.1* 16.1*  INR 3.06* 1.40 1.38 1.28    Recent Labs  04/04/15 1109 04/06/15 1140  COLORURINE YELLOW YELLOW  LABSPEC 1.015 1.013  PHURINE 7.0 6.5  GLUCOSEU 100* 100*  HGBUR MODERATE* TRACE*  BILIRUBINUR NEGATIVE NEGATIVE  KETONESUR 15* NEGATIVE  PROTEINUR >300* 100*  UROBILINOGEN 1.0 1.0  NITRITE NEGATIVE NEGATIVE  LEUKOCYTESUR NEGATIVE NEGATIVE       Component Value Date/Time   CHOL 161 04/05/2015 0819   CHOL 176 02/17/2015 0810   TRIG 104 04/05/2015 0819   HDL 43 04/05/2015 0819   HDL 70 02/17/2015 0810   CHOLHDL 3.7 04/05/2015 0819   CHOLHDL 2.5  02/17/2015 0810   VLDL 21 04/05/2015 0819   LDLCALC 97 04/05/2015 0819   LDLCALC 92 02/17/2015 0810   Lab Results  Component Value Date   HGBA1C 5.9* 04/05/2015   No results found for: LABOPIA, COCAINSCRNUR, LABBENZ, AMPHETMU, THCU, LABBARB  No results for input(s): ETH in the last 168 hours.  I have personally reviewed the radiological images below and agree with the radiology interpretations.  Dg Chest 2 View  04/04/2015    IMPRESSION: Stable mild cardiac enlargement.  No active disease.     Ct Head Wo Contrast  04/05/2015   IMPRESSION: Size stable ~ 4 cm right basal ganglia hematoma. Intraventricular hemorrhage has redistributed, accounting for newly seen trace subarachnoid blood. Obstructive right lateral ventriculomegaly which is stable.    04/04/2015   IMPRESSION: Large acute hemorrhage centered in the region of the right caudate nucleus with rupture into the lateral ventricle and evidence of developing hydrocephalus and trapping of the right lateral ventricle. There is a mild amount of midline shift to the left. Parenchymal hemorrhage measures roughly 3.2 x 4.6 x 3.0 cm. This is most likely a hypertensive parenchymal bleed.    2D Echocardiogram  - Left ventricle: Systolic function was normal. The estimated ejection fraction was in the range of 55% to  60%. - Aortic valve: There was mild regurgitation. - Mitral valve: There was mild to moderate regurgitation directed centrally. - Left atrium: The atrium was moderately dilated. - Right atrium: The atrium was moderately dilated. - Pulmonic valve: There was moderate regurgitation directed centrally. - Pulmonary arteries: Systolic pressure was mildly increased. PA peak pressure: 43 mm Hg (S).  EKG  atrial fibrillation, rate 82. For complete results please see formal report.  PHYSICAL EXAM  Temp:  [97.9 F (36.6 C)-98.7 F (37.1 C)] 98.2 F (36.8 C) (09/14 1537) Pulse Rate:  [41-88] 55 (09/14 1500) Resp:  [15-31] 21  (09/14 1500) BP: (120-173)/(55-118) 120/56 mmHg (09/14 1500) SpO2:  [92 %-100 %] 97 % (09/14 1500)  General - Well nourished, well developed, in no apparent distress.  Ophthalmologic - fundi not visualized due to noncooperation.  Cardiovascular - irregularly irregular heart rate and rhythm.  Mental Status -  Level of arousal and orientation to place and people was intact, but not orientated to time or age. Language including expression, naming, repetition, comprehension was assessed and found intact. Fund of Knowledge was assessed and was impaired.  Cranial Nerves II - XII - II - Visual field intact OU. III, IV, VI - Extraocular movements intact. V - Facial sensation intact bilaterally. VII - right nasolabial fold flattening. VIII - Hearing & vestibular intact bilaterally. X - Palate elevates symmetrically. XI - Chin turning & shoulder shrug intact bilaterally. XII - Tongue protrusion intact.  Motor Strength - The patient's strength was 4+/5 in all extremities and pronator drift was absent.  Bulk was normal and fasciculations were absent.   Motor Tone - Muscle tone was assessed at the neck and appendages and was normal.  Reflexes - The patient's reflexes were symmetrical in all extremities and she had no pathological reflexes.  Sensory - Light touch, temperature/pinprick were assessed and were symmetrical.    Coordination - The patient had normal movements in the hands with no ataxia or dysmetria.  Tremor was absent.  Gait and Station - deferred due to safety concerns.   ASSESSMENT/PLAN Nicole [name] is a 81 y.o. female with history of HTN, DM, CHF, HLD, afib on coumadin, and CKD admitted for right caudate head ICH with IVH. Symptoms stable.    ICH:  Right caudate head with ventricular extension likely secondary to HTN  CT repeat showed stable hematoma and obstructive right lateral ventriculomegaly   NSG Dr. Saintclair Halsted on board and no surgical intervention at this  time  2D Echo  EF 55-60%  LDL 97  HgbA1c 5.9  SCDs for VTE prophylaxis  Diet Heart Room service appropriate?: Yes; Fluid consistency:: Thin   warfarin prior to admission, now on no antithrombotic  Ongoing aggressive stroke risk factor management  Therapy recommendations:  pending  Disposition:  Pending  Obstructive hydrocephalus  Right ventriculomagely  NSG following  No indication for intervention now  Repeat CT head in a.m.  Afib on coumadin at home  Still has afib rhythm on tele  On cardizam and metoprolol at home  Hold off coumadin  Rate controlled  Diabetes  HgbA1c 5.9 goal < 7.0  Controlled  CBG monitoring  SSI  Hypertension  Home meds:   cardizem and metoprolol BP goal < 160. Currently on home meds, and labetalol PRN Add clonidine 0.1mg  tid  Unstable, on the high side  Hyperlipidemia  Home meds:  zocor 40   Currently on none due to [place]  LDL 97, goal < 70  Resume statin at  discharge  CKD, stage III  Monitor Cre 1.32  Avoid nephrotoxic agent  Other Stroke Risk Factors  Advanced age  Other Active Problems  Mild anemia  hypokalemia  Other Pertinent History    Hospital day # 2  This patient is critically ill due to [place] with IVH and hydrocephalus and afib not on AC and at significant risk of neurological worsening, death form cerebral edema, recurrent ICH, obstructive hydrocephalus, brain herniation, ischemic stroke due to afib not on AC. This patient's care requires constant monitoring of vital signs, hemodynamics, respiratory and cardiac monitoring, review of multiple databases, neurological assessment, discussion with family, other specialists and medical decision making of high complexity. I spent 35 minutes of neurocritical care time in the care of this patient.   Rosalin Hawking, MD PhD Stroke Neurology 04/06/2015 4:35 PM    To contact Stroke Continuity provider, please refer to [email or web address] After hours, contact  General Neurology

## 2015-04-07 ENCOUNTER — Encounter (HOSPITAL_COMMUNITY): Payer: Self-pay | Admitting: Radiology

## 2015-04-07 ENCOUNTER — Inpatient Hospital Stay (HOSPITAL_COMMUNITY): Payer: Medicare Other

## 2015-04-07 DIAGNOSIS — G8190 Hemiplegia, unspecified affecting unspecified side: Secondary | ICD-10-CM

## 2015-04-07 DIAGNOSIS — I610 Nontraumatic intracerebral hemorrhage in hemisphere, subcortical: Secondary | ICD-10-CM

## 2015-04-07 DIAGNOSIS — E1159 Type 2 diabetes mellitus with other circulatory complications: Secondary | ICD-10-CM

## 2015-04-07 DIAGNOSIS — H53462 Homonymous bilateral field defects, left side: Secondary | ICD-10-CM

## 2015-04-07 LAB — BASIC METABOLIC PANEL
ANION GAP: 11 (ref 5–15)
BUN: 9 mg/dL (ref 6–20)
CO2: 23 mmol/L (ref 22–32)
Calcium: 8.9 mg/dL (ref 8.9–10.3)
Chloride: 106 mmol/L (ref 101–111)
Creatinine, Ser: 1.19 mg/dL — ABNORMAL HIGH (ref 0.44–1.00)
GFR, EST AFRICAN AMERICAN: 48 mL/min — AB (ref >60)
GFR, EST NON AFRICAN AMERICAN: 41 mL/min — AB (ref >60)
Glucose, Bld: 148 mg/dL — ABNORMAL HIGH (ref 65–99)
POTASSIUM: 3.2 mmol/L — AB (ref 3.5–5.1)
SODIUM: 140 mmol/L (ref 135–145)

## 2015-04-07 LAB — CBC
HCT: 35.2 % — ABNORMAL LOW (ref 36.0–46.0)
HEMOGLOBIN: 11.8 g/dL — AB (ref 12.0–15.0)
MCH: 25.7 pg — ABNORMAL LOW (ref 26.0–34.0)
MCHC: 33.5 g/dL (ref 30.0–36.0)
MCV: 76.7 fL — ABNORMAL LOW (ref 78.0–100.0)
PLATELETS: 268 10*3/uL (ref 150–400)
RBC: 4.59 MIL/uL (ref 3.87–5.11)
RDW: 17.1 % — ABNORMAL HIGH (ref 11.5–15.5)
WBC: 9.4 10*3/uL (ref 4.0–10.5)

## 2015-04-07 LAB — GLUCOSE, CAPILLARY
GLUCOSE-CAPILLARY: 155 mg/dL — AB (ref 65–99)
GLUCOSE-CAPILLARY: 172 mg/dL — AB (ref 65–99)
GLUCOSE-CAPILLARY: 122 mg/dL — AB (ref 65–99)
Glucose-Capillary: 140 mg/dL — ABNORMAL HIGH (ref 65–99)

## 2015-04-07 MED ORDER — POTASSIUM CHLORIDE CRYS ER 20 MEQ PO TBCR
40.0000 meq | EXTENDED_RELEASE_TABLET | ORAL | Status: AC
  Administered 2015-04-07 (×2): 40 meq via ORAL
  Filled 2015-04-07 (×2): qty 2

## 2015-04-07 MED ORDER — CLONIDINE HCL 0.1 MG PO TABS
0.2000 mg | ORAL_TABLET | Freq: Three times a day (TID) | ORAL | Status: DC
  Administered 2015-04-07 – 2015-04-12 (×14): 0.2 mg via ORAL
  Filled 2015-04-07 (×3): qty 2
  Filled 2015-04-07: qty 1
  Filled 2015-04-07: qty 2
  Filled 2015-04-07: qty 1
  Filled 2015-04-07 (×4): qty 2
  Filled 2015-04-07: qty 1
  Filled 2015-04-07 (×6): qty 2
  Filled 2015-04-07: qty 1

## 2015-04-07 NOTE — Progress Notes (Signed)
STROKE TEAM PROGRESS NOTE   SUBJECTIVE (INTERVAL HISTORY) Son is at the bedside.  Overall she feels her condition is stable. BP still on the high side, got labetalol PRN. Will increase PO meds. Repeat CT head stable.   OBJECTIVE Temp:  [97.9 F (36.6 C)-99 F (37.2 C)] 97.9 F (36.6 C) (09/15 1100) Pulse Rate:  [39-93] 68 (09/15 1500) Cardiac Rhythm:  [-] Atrial fibrillation (09/15 0800) Resp:  [15-29] 26 (09/15 1500) BP: (127-181)/(67-97) 153/83 mmHg (09/15 1500) SpO2:  [91 %-100 %] 97 % (09/15 1500)   Recent Labs Lab 04/06/15 1158 04/06/15 2213 04/07/15 0749 04/07/15 1134 04/07/15 1550  GLUCAP 175* 163* 155* 172* 122*    Recent Labs Lab 04/04/15 0945 04/05/15 0819 04/06/15 0217 04/07/15 0250  NA 142 144 143 140  K 3.8 3.3* 3.2* 3.2*  CL 106 110 111 106  CO2 24 26 24 23   GLUCOSE 198* 139* 130* 148*  BUN 14 10 11 9   CREATININE 1.35* 1.10* 1.32* 1.19*  CALCIUM 9.1 8.9 8.9 8.9    Recent Labs Lab 04/04/15 0945  AST 22  ALT 10*  ALKPHOS 91  BILITOT 1.0  PROT 7.8  ALBUMIN 3.6    Recent Labs Lab 04/04/15 0945 04/05/15 0819 04/06/15 0217 04/07/15 0250  WBC 7.1 7.5 6.9 9.4  NEUTROABS 6.1  --   --   --   HGB 12.8 11.7* 11.6* 11.8*  HCT 37.4 34.2* 34.0* 35.2*  MCV 77.1* 76.5* 76.6* 76.7*  PLT 329 291 270 268   No results for input(s): CKTOTAL, CKMB, CKMBINDEX, TROPONINI in the last 168 hours.  Recent Labs  04/04/15 2010 04/05/15 0228  LABPROT 17.1* 16.1*  INR 1.38 1.28    Recent Labs  04/06/15 1140  COLORURINE YELLOW  LABSPEC 1.013  PHURINE 6.5  GLUCOSEU 100*  HGBUR TRACE*  BILIRUBINUR NEGATIVE  KETONESUR NEGATIVE  PROTEINUR 100*  UROBILINOGEN 1.0  NITRITE NEGATIVE  LEUKOCYTESUR NEGATIVE       Component Value Date/Time   CHOL 161 04/05/2015 0819   CHOL 176 02/17/2015 0810   TRIG 104 04/05/2015 0819   HDL 43 04/05/2015 0819   HDL 70 02/17/2015 0810   CHOLHDL 3.7 04/05/2015 0819   CHOLHDL 2.5 02/17/2015 0810   VLDL 21  04/05/2015 0819   LDLCALC 97 04/05/2015 0819   LDLCALC 92 02/17/2015 0810   Lab Results  Component Value Date   HGBA1C 5.9* 04/05/2015   No results found for: LABOPIA, COCAINSCRNUR, LABBENZ, AMPHETMU, THCU, LABBARB  No results for input(s): ETH in the last 168 hours.  I have personally reviewed the radiological images below and agree with the radiology interpretations.  Dg Chest 2 View  04/04/2015    IMPRESSION: Stable mild cardiac enlargement.  No active disease.     Ct Head Wo Contrast  04/07/2015   IMPRESSION: Evolving RIGHT basal ganglia hematoma with intraventricular extension. Mild RIGHT ventricular entrapment.  Increasing small to moderate amount of subarachnoid blood products are likely re- distributed.     04/05/2015   IMPRESSION: Size stable ~ 4 cm right basal ganglia hematoma. Intraventricular hemorrhage has redistributed, accounting for newly seen trace subarachnoid blood. Obstructive right lateral ventriculomegaly which is stable.    04/04/2015   IMPRESSION: Large acute hemorrhage centered in the region of the right caudate nucleus with rupture into the lateral ventricle and evidence of developing hydrocephalus and trapping of the right lateral ventricle. There is a mild amount of midline shift to the left. Parenchymal hemorrhage measures roughly 3.2 x 4.6  x 3.0 cm. This is most likely a hypertensive parenchymal bleed.    2D Echocardiogram  - Left ventricle: Systolic function was normal. The estimated ejection fraction was in the range of 55% to 60%. - Aortic valve: There was mild regurgitation. - Mitral valve: There was mild to moderate regurgitation directed centrally. - Left atrium: The atrium was moderately dilated. - Right atrium: The atrium was moderately dilated. - Pulmonic valve: There was moderate regurgitation directed centrally. - Pulmonary arteries: Systolic pressure was mildly increased. PA peak pressure: 43 mm Hg (S).  EKG  atrial fibrillation, rate  82. For complete results please see formal report.  PHYSICAL EXAM  Temp:  [97.9 F (36.6 C)-99 F (37.2 C)] 97.9 F (36.6 C) (09/15 1100) Pulse Rate:  [39-93] 68 (09/15 1500) Resp:  [15-29] 26 (09/15 1500) BP: (127-181)/(67-97) 153/83 mmHg (09/15 1500) SpO2:  [91 %-100 %] 97 % (09/15 1500)  General - Well nourished, well developed, in no apparent distress.  Ophthalmologic - fundi not visualized due to noncooperation.  Cardiovascular - irregularly irregular heart rate and rhythm.  Mental Status -  Level of arousal and orientation to place and people was intact, but not orientated to time or age. Language including expression, naming, repetition, comprehension was assessed and found intact. Fund of Knowledge was assessed and was impaired.  Cranial Nerves II - XII - II - Visual field intact OU. III, IV, VI - Extraocular movements intact. V - Facial sensation intact bilaterally. VII - right nasolabial fold mild flattening. VIII - Hearing & vestibular intact bilaterally. X - Palate elevates symmetrically. XI - Chin turning & shoulder shrug intact bilaterally. XII - Tongue protrusion intact.  Motor Strength - The patient's strength was 4+/5 in all extremities and pronator drift was absent.  Bulk was normal and fasciculations were absent.   Motor Tone - Muscle tone was assessed at the neck and appendages and was normal.  Reflexes - The patient's reflexes were symmetrical in all extremities and she had no pathological reflexes.  Sensory - Light touch, temperature/pinprick were assessed and were symmetrical.    Coordination - The patient had normal movements in the hands with no ataxia or dysmetria.  Tremor was absent.  Gait and Station - deferred due to safety concerns.   ASSESSMENT/PLAN Nicole [name] is a 81 y.o. female with history of HTN, DM, CHF, HLD, afib on coumadin, and CKD admitted for right caudate head ICH with IVH. Symptoms stable.    ICH:  Right caudate  head with ventricular extension likely secondary to HTN  CT repeat showed stable hematoma and obstructive right lateral ventriculomegaly   NSG Dr. Saintclair Halsted on board and no surgical intervention at this time  2D Echo  EF 55-60%  LDL 97  HgbA1c 5.9  SCDs for VTE prophylaxis  Diet Heart Room service appropriate?: Yes; Fluid consistency:: Thin   warfarin prior to admission, now on no antithrombotic  Ongoing aggressive stroke risk factor management  Therapy recommendations:  pending  Disposition:  Pending  Obstructive hydrocephalus  Right ventriculomagely  NSG following  No indication for intervention now  Repeat CT head stable hematoma and hydrocephalus  Afib on coumadin at home  Still has afib rhythm on tele  On cardizam and metoprolol at home  Hold off coumadin  Rate controlled  Diabetes  HgbA1c 5.9 goal < 7.0  Controlled  CBG monitoring  SSI  Hypertension  Home meds:   cardizem and metoprolol BP goal < 160. Currently on home meds, and  labetalol PRN increase clonidine to 0.2 mg tid  Unstable, on the high side  Hyperlipidemia  Home meds:  zocor 40   Currently on none due to [place]  LDL 97, goal < 70  Resume statin at discharge  CKD, stage III  Monitor Cre 1.32 -> 1.19  Avoid nephrotoxic agent  Continue IVF for now  Other Stroke Risk Factors  Advanced age  Other Active Problems  Mild anemia  hypokalemia  Other Pertinent History    Hospital day # 3  This patient is critically ill due to [place] with IVH and hydrocephalus and afib not on AC and at significant risk of neurological worsening, death form cerebral edema, recurrent ICH, obstructive hydrocephalus, brain herniation, ischemic stroke due to afib not on AC. This patient's care requires constant monitoring of vital signs, hemodynamics, respiratory and cardiac monitoring, review of multiple databases, neurological assessment, discussion with family, other specialists and medical  decision making of high complexity. I spent 35 minutes of neurocritical care time in the care of this patient.   Rosalin Hawking, MD PhD Stroke Neurology 04/07/2015 4:20 PM    To contact Stroke Continuity provider, please refer to [email or web address] After hours, contact General Neurology

## 2015-04-07 NOTE — Consult Note (Signed)
Physical Medicine and Rehabilitation Consult Reason for Consult: Intracerebral hemorrhage Referring Physician: Dr.Xu   HPI: Nicole [name] is a 81 y.o. right handed female with history of hypertension, systolic congestive heart failure, diabetes mellitus peripheral neuropathy, chronic renal insufficiency with baseline creatinine 1.35, atrial fibrillation on chronic Coumadin. Patient independent prior to admission still driving use a single-point cane living alone. Presented 04/04/2015 with altered mental status as well as headache. Blood pressure 166/103. INR 3.6. CT of the head showed a large acute hemorrhage centered in the region of the right caudate nucleus with rupture into the lateral ventricle and evidence of developing hydrocephalus. Parenchymal hemorrhage measuring roughly 3.2 x 4.6 x 3.0 cm. Neurosurgery Dr. Saintclair Halsted consulted advise conservative care. Coumadin was reversed. Follow-up serial cranial CT scans 04/07/2015 showing evolving right basal ganglia hematoma with intraventricular extension. Echocardiogram with ejection fraction of 65% normal systolic function. Maintain on a regular consistency diet. Physical therapy evaluation completed 04/06/2015 with recommendations of physical medicine rehabilitation consult.   Review of Systems  Constitutional: Negative for fever and chills.  HENT: Negative for hearing loss.   Eyes: Positive for blurred vision. Negative for double vision.  Respiratory: Negative for cough.        Shortness of breath with exertion  Cardiovascular: Positive for palpitations and leg swelling. Negative for chest pain.  Gastrointestinal: Positive for constipation. Negative for nausea, vomiting and abdominal pain.       GERD  Genitourinary: Negative for dysuria and hematuria.  Musculoskeletal: Positive for myalgias and joint pain.  Neurological: Positive for dizziness and headaches. Negative for loss of consciousness.  Psychiatric/Behavioral:       Anxiety     Past Medical History  Diagnosis Date  . Colon cancer 07/1997  . Hypertension   . Diabetes mellitus   . CHF (congestive heart failure)   . AAA (abdominal aortic aneurysm)   . Atrial fibrillation   . Hypercholesteremia   . Kidney stone     renal calculi  . Small bowel obstruction due to adhesions 05/16/2012  . Hypothyroidism   . Arthritis   . Anemia   . Cholelithiasis   . Perforation of bile duct   . Hemorrhage of rectum and anus   . Swelling, mass, or lump in head and neck   . Cervicalgia   . Hypoglycemia, unspecified   . Pain in joint, lower leg   . Anxiety   . Shortness of breath   . Acute upper respiratory infections of unspecified site   . Depression   . Thoracic aneurysm without mention of rupture   . GERD (gastroesophageal reflux disease)   . Proteinuria   . Other specified cardiac dysrhythmias(427.89)   . Congestive heart failure, unspecified   . Electrolyte and fluid disorders not elsewhere classified   . Other chronic allergic conjunctivitis   . Hypopotassemia   . Chronic systolic heart failure   . Dizziness and giddiness   . Malignant neoplasm of colon, unspecified site   . Other and unspecified hyperlipidemia   . Gout, unspecified   . Atrial fibrillation   . Aortic aneurysm of unspecified site without mention of rupture   . Pain in joint, site unspecified   . Palpitations   . CKD (chronic kidney disease)   . Diabetes mellitus    Past Surgical History  Procedure Laterality Date  . Hemicolectomy  08/11/1997  . Kidney stone surgery    . Colon surgery      to remove colon ca  .  Cholecystectomy  05/14/2012    Procedure: LAPAROSCOPIC CHOLECYSTECTOMY WITH INTRAOPERATIVE CHOLANGIOGRAM;  Surgeon: Haywood Lasso, MD;  Location: [place];  Service: General;  Laterality: N/A;  . Ercp  05/17/2012    Procedure: ENDOSCOPIC RETROGRADE CHOLANGIOPANCREATOGRAPHY (ERCP);  Surgeon: Missy Sabins, MD;  Location: [place];  Service: Gastroenterology;  Laterality: N/A;  .  Ercp  08/12/2012    Procedure: ENDOSCOPIC RETROGRADE CHOLANGIOPANCREATOGRAPHY (ERCP);  Surgeon: Missy Sabins, MD;  Location: [facility];  Service: Endoscopy;  Laterality: N/A;  . Transthoracic echocardiogram  11/2009    EF 45-50%, mild-mod AV regurg; mild MR; mod TR; ascending aorta mildly dilated  . Cardiac catheterization  2008    moderate severe pulm HTN; with elevted pulm capillary wedge pressure    Family History  Problem Relation Age of Onset  . Heart disease Mother   . Stroke Father   . Hypertension Daughter   . Hypertension Son   . Diabetes Sister   . Hypertension Son    Social History:  reports that she has never smoked. She has never used smokeless tobacco. She reports that she does not drink alcohol or use illicit drugs. Allergies:  Allergies  Allergen Reactions  . Iohexol      Code: VOM, Desc: PT REPORTS VOMITING W/ IVP DYE- ARS 12/26/08, Onset Date: [id number]   . Z-Pak [Azithromycin]   . Iodinated Diagnostic Agents Nausea Only    PT HAS HAD SINGULAR INCIDENT OF NAUSEA  W/ IV CONTYRAST INJECTION, NONE IF INJECTED SLOWLY//A.CALHOUN   Medications Prior to Admission  Medication Sig Dispense Refill  . allopurinol (ZYLOPRIM) 100 MG tablet Take 1 tablet (100 mg total) by mouth daily. 30 tablet 3  . bacitracin ointment Apply 1 application topically 2 (two) times daily. (Patient taking differently: Apply 1 application topically as needed for wound care. ) 120 g 0  . diltiazem (CARDIZEM CD) 240 MG 24 hr capsule take 1 capsule by mouth once daily 30 capsule 9  . furosemide (LASIX) 40 MG tablet take 1 tablet by mouth twice a day (Patient taking differently: take 1 tablet by mouth once a day) 60 tablet 11  . glipiZIDE (GLUCOTROL) 10 MG tablet Take one tablet by mouth once daily to control blood sugar (Patient taking differently: Take 10 mg by mouth daily before breakfast. Take one tablet by mouth once daily to control blood sugar) 30 tablet 6  . levothyroxine (SYNTHROID, LEVOTHROID) 50  MCG tablet Take one tablet by mouth 30 minutes before breakfast once daily for thyroid (Patient taking differently: Take 50 mcg by mouth daily before breakfast. Take one tablet by mouth 30 minutes before breakfast once daily for thyroid) 90 tablet 3  . magnesium oxide (MAG-OX) 400 (241.3 MG) MG tablet Take 400 mg by mouth daily.  0  . metFORMIN (GLUCOPHAGE) 500 MG tablet Take 1 tablet (500 mg total) by mouth 2 (two) times daily with a meal. 60 tablet 5  . Multiple Vitamins-Minerals (WOMENS BONE HEALTH PO) Take 1 tablet by mouth daily.    . polyethylene glycol (MIRALAX / GLYCOLAX) packet Take 17 g by mouth daily as needed for mild constipation.     . potassium chloride SA (K-DUR,KLOR-CON) 20 MEQ tablet Take one tablet by mouth once daily for potassium (Patient taking differently: Take 20 mEq by mouth daily. Take one tablet by mouth once daily for potassium) 30 tablet 5  . simvastatin (ZOCOR) 40 MG tablet Take one tablet by mouth once daily for cholesterol (Patient taking differently: Take 40 mg  by mouth daily. Take one tablet by mouth once daily for cholesterol) 30 tablet 3  . warfarin (COUMADIN) 5 MG tablet take 1 tablet by mouth once daily or as directed (Patient taking differently: Take 2.5 mg by mouth daily on Sun, Tues, Thurs, and Sat. Take 5 mg by mouth daily on Mon, Wed, and Fri.) 30 tablet 3  . metoprolol (LOPRESSOR) 25 MG tablet Take 1 tablet (25 mg total) by mouth 2 (two) times daily. (Patient not taking: Reported on 04/04/2015) 60 tablet 3    Home: [place] expects to be discharged to:: Private residence Living Arrangements: Alone Available Help at Discharge: Family, Available 24 hours/day Type of Home: House Home Access: Stairs to enter Technical brewer of Steps: 2 Entrance Stairs-Rails: Right Home Layout: One level Bathroom Shower/Tub: Walk-in shower Home Equipment: [place] - single point Additional Comments: pt drove and generally did for herself  Functional  History: Prior Function Level of Independence: Independent Functional Status:  Mobility: Bed Mobility Overal bed mobility: Needs Assistance Bed Mobility: Supine to Sit Supine to sit: Min assist General bed mobility comments: cues for direction and alternative ways to scoot and extra time. Transfers Overall transfer level: Needs assistance Transfers: Sit to/from Stand, Stand Pivot Transfers Sit to Stand: Min assist Stand pivot transfers: Min assist General transfer comment: pt supports herself on the bed rail and is stiff, and guarded with transfer Ambulation/Gait Ambulation/Gait assistance: Min assist Ambulation Distance (Feet): 90 Feet Assistive device: 1 person hand held assist Gait Pattern/deviations: Step-through pattern, Decreased step length - right, Decreased step length - left, Decreased stride length, Narrow base of support General Gait Details: unsteady gait with small, tentative steps, higher guard postition with R UE.  Not scanning her environment Gait velocity interpretation: Below normal speed for age/gender    ADL:    Cognition: Cognition Overall Cognitive Status: Within Functional Limits for tasks assessed Orientation Level: Oriented to person, Disoriented to time, Oriented to place, Oriented to situation Cognition Arousal/Alertness: Lethargic Behavior During Therapy: Flat affect Overall Cognitive Status: Within Functional Limits for tasks assessed  Blood pressure 156/87, pulse 63, temperature 98.8 F (37.1 C), temperature source Oral, resp. rate 15, height 5\' 8"  (1.727 m), weight 76 kg (167 lb 8.8 oz), SpO2 92 %. Physical Exam  HENT:  Head: Normocephalic.  Eyes:  Pupils sluggish but reactive to light  Neck: Normal range of motion. Neck supple. No thyromegaly present.  Cardiovascular:  Irregular irregular  Respiratory: Effort normal and breath sounds normal. No respiratory distress.  GI: Soft. Bowel sounds are normal. She exhibits no distension.    Neurological: She is alert.  Mood is flat but appropriate. She makes good eye contact with examiner. Follows simple commands. Fair awareness of her deficits.  Skin: Skin is warm and dry.  Motor strength is 3 minus in the left deltoid, biceps, triceps, grip 3 minus in the left hip flexor knee extensor 2 minus in the left ankle does flex plantar flexor 4/5 Right deltoid, biceps, triceps, grip, hip flexor, knee extensor, ankle dorsi flexor and plantar flexor Reduced sensation to touch bilateral feet and bilateral hands Decreased visual fields to the left, difficult to assess for neglect secondary to mental status, poor attention concentration Results for orders placed or performed during the hospital encounter of 04/04/15 (from the past 24 hour(s))  Urinalysis with microscopic     Status: Abnormal   Collection Time: 04/06/15 11:40 AM  Result Value Ref Range   Color, Urine YELLOW YELLOW   APPearance CLEAR  CLEAR   Specific Gravity, Urine 1.013 1.005 - 1.030   pH 6.5 5.0 - 8.0   Glucose, UA 100 (A) NEGATIVE mg/dL   Hgb urine dipstick TRACE (A) NEGATIVE   Bilirubin Urine NEGATIVE NEGATIVE   Ketones, ur NEGATIVE NEGATIVE mg/dL   Protein, ur 100 (A) NEGATIVE mg/dL   Urobilinogen, UA 1.0 0.0 - 1.0 mg/dL   Nitrite NEGATIVE NEGATIVE   Leukocytes, UA NEGATIVE NEGATIVE   WBC, UA 7-10 <3 WBC/hpf   RBC / HPF 3-6 <3 RBC/hpf   Bacteria, UA FEW (A) RARE   Squamous Epithelial / LPF FEW (A) RARE  Glucose, capillary     Status: Abnormal   Collection Time: 04/06/15 11:58 AM  Result Value Ref Range   Glucose-Capillary 175 (H) 65 - 99 mg/dL  Glucose, capillary     Status: Abnormal   Collection Time: 04/06/15 10:13 PM  Result Value Ref Range   Glucose-Capillary 163 (H) 65 - 99 mg/dL  CBC     Status: Abnormal   Collection Time: 04/07/15  2:50 AM  Result Value Ref Range   WBC 9.4 4.0 - 10.5 K/uL   RBC 4.59 3.87 - 5.11 MIL/uL   Hemoglobin 11.8 (L) 12.0 - 15.0 g/dL   HCT 35.2 (L) 36.0 - 46.0 %   MCV  76.7 (L) 78.0 - 100.0 fL   MCH 25.7 (L) 26.0 - 34.0 pg   MCHC 33.5 30.0 - 36.0 g/dL   RDW 17.1 (H) 11.5 - 15.5 %   Platelets 268 150 - 400 K/uL  Basic metabolic panel     Status: Abnormal   Collection Time: 04/07/15  2:50 AM  Result Value Ref Range   Sodium 140 135 - 145 mmol/L   Potassium 3.2 (L) 3.5 - 5.1 mmol/L   Chloride 106 101 - 111 mmol/L   CO2 23 22 - 32 mmol/L   Glucose, Bld 148 (H) 65 - 99 mg/dL   BUN 9 6 - 20 mg/dL   Creatinine, Ser 1.19 (H) 0.44 - 1.00 mg/dL   Calcium 8.9 8.9 - 10.3 mg/dL   GFR calc non Af Amer 41 (L) >60 mL/min   GFR calc Af Amer 48 (L) >60 mL/min   Anion gap 11 5 - 15   Ct Head Wo Contrast  04/07/2015   CLINICAL DATA:  Follow-up intracranial hemorrhage. History of colon cancer, diabetes, atrial fibrillation.  EXAM: CT HEAD WITHOUT CONTRAST  TECHNIQUE: Contiguous axial images were obtained from the base of the skull through the vertex without intravenous contrast.  COMPARISON:  CT head April 05, 2015  FINDINGS: Similar appearance of 3.8 x 3 cm RIGHT basal ganglia intraparenchymal hemorrhage with intraventricular extension, dependent blood products in the occipital horns. Mass effect on the third ventricle with mild RIGHT ventricular entrapment. Small amount of probable redistributed subarachnoid blood within the RIGHT temporal occipital sulci, sylvian fissures. No acute large vascular territory infarcts. Vasogenic edema RIGHT frontal lobe, in a background of at least moderate white matter changes compatible with chronic small vessel ischemic disease. Moderate calcific atherosclerosis of the carotid siphons.  Basal cisterns are patent. Status post bilateral ocular lens implants. Lobulated mucosal thickening sphenoid sinuses. No skull fracture. Severe atlantodental osteoarthrosis partially characterized.  IMPRESSION: Evolving RIGHT basal ganglia hematoma with intraventricular extension. Mild RIGHT ventricular entrapment.  Increasing small to moderate amount of  subarachnoid blood products are likely re- distributed.   Electronically Signed   By: Elon Alas M.D.   On: 04/07/2015 05:32    Assessment/Plan: Diagnosis:  Right caudate intracerebral hemorrhage with left hemiparesis and left field cut 1. Does the need for close, 24 hr/day medical supervision in concert with the patient's rehab needs make it unreasonable for this patient to be served in a less intensive setting? Yes 2. Co-Morbidities requiring supervision/potential complications: Diabetes with neuropathy, renal sufficiency 3. Due to bladder management, bowel management, safety, skin/wound care, disease management, medication administration, pain management and patient education, does the patient require 24 hr/day rehab nursing? Yes 4. Does the patient require coordinated care of a physician, rehab nurse, PT (1-2 hrs/day, 5 days/week), OT (1-2 hrs/day, 5 days/week) and SLP (.5-1 hrs/day, 5 days/week) to address physical and functional deficits in the context of the above medical diagnosis(es)? Yes Addressing deficits in the following areas: balance, endurance, locomotion, strength, transferring, bowel/bladder control, bathing, dressing, feeding, grooming, toileting, cognition, speech, language, swallowing and psychosocial support 5. Can the patient actively participate in an intensive therapy program of at least 3 hrs of therapy per day at least 5 days per week? Yes 6. The potential for patient to make measurable gains while on inpatient rehab is excellent 7. Anticipated functional outcomes upon discharge from inpatient rehab are supervision  with PT, supervision with OT, supervision with SLP. 8. Estimated rehab length of stay to reach the above functional goals is: 18-22d 9. Does the patient have adequate social supports and living environment to accommodate these discharge functional goals? Yes 10. Anticipated D/C setting: Home 11. Anticipated post D/C treatments: [place] therapy 12. Overall  Rehab/Functional Prognosis: excellent  RECOMMENDATIONS: This patient's condition is appropriate for continued rehabilitative care in the following setting: CIR Patient has agreed to participate in recommended program. N/A Note that insurance prior authorization may be required for reimbursement for recommended care.  Comment:     04/07/2015

## 2015-04-07 NOTE — Care Management Important Message (Signed)
Important Message  Patient Details  Name: Nicole [name] MRN: [id number] Date of Birth: August 07, 1930   Medicare Important Message Given:  Yes-second notification given    Nathen May 04/07/2015, 3:46 PM

## 2015-04-07 NOTE — Progress Notes (Signed)
Occupational Therapy Evaluation Patient Details Name: Nicole [name] MRN: [id number] DOB: 1930/08/27 Today's Date: 04/07/2015    History of Present Illness Nicole [name] is an 81 y.o. female history of atrial fibrillation on Coumadin, hypertension, hyperlipidemia, diabetes mellitus, CHF and chronic kidney disease, brought to the emergency room after being found on the floor home. CT scan of her head showed fairly large intracerebral hemorrhage involving the right caudate and extending into right ventricle with mass effect with mild midline shift as well as right ventricular enlargement. evaluated by Dr. Saintclair Halsted of the Neurosurgery Service. Ventricular drain is not felt to be warranted at this time.   Clinical Impression   PTA,  Pt lived alone and was independent with ADL and mobility. Drove and was active in a bowling league. Pt with significant deficits as listed below and is an excellent CIR candidate. Very supportive family who can provide 24/7 S after D/C. Will follow acutely to address established goals.     Follow Up Recommendations  CIR;Supervision/Assistance - 24 hour    Equipment Recommendations  3 in 1 bedside comode    Recommendations for Other Services Rehab consult     Precautions / Restrictions Precautions Precautions: Fall Restrictions Weight Bearing Restrictions: No      Mobility Bed Mobility               General bed mobility comments: Pt sitting in chair  Transfers Overall transfer level: Needs assistance     Sit to Stand: Min assist Stand pivot transfers: Min assist       General transfer comment: with time, able to scoot to edge of chair with S    Balance     Sitting balance-Leahy Scale: Fair Sitting balance - Comments: posterior bias with challenge   Standing balance support: Single extremity supported Standing balance-Leahy Scale: Fair                              ADL Overall ADL's : Needs  assistance/impaired Eating/Feeding: Set up   Grooming: Supervision/safety;Set up Grooming Details (indicate cue type and reason): cues to attend and sequence Upper Body Bathing: Supervision/ safety;Set up;Sitting Upper Body Bathing Details (indicate cue type and reason): cues to complete tasks and attend Lower Body Bathing: Minimal assistance;Sit to/from stand   Upper Body Dressing : Moderate assistance;Sitting   Lower Body Dressing: Moderate assistance;Sit to/from stand Lower Body Dressing Details (indicate cue type and reason): trying to pull up her SCD hose as if they were her socks. Kept taking sock off with cues to donn socks Toilet Transfer: Minimal assistance;Ambulation   Toileting- Clothing Manipulation and Hygiene: Minimal assistance;Sit to/from stand       Functional mobility during ADLs: Minimal assistance;+2 for safety/equipment;Cueing for sequencing;Cueing for safety (short shuffling gait pattern. ) General ADL Comments: difficulty with initiation of movement and attending to tasks to cmoplete     Vision Vision Assessment?: Vision impaired- to be further tested in functional context Additional Comments: Pt not aware of running into items on her left. Poor spatial awareness   Perception Perception Spatial deficits: poor spatial awareness. Will further assess Comments: states "things just look different". Pt thought she was in a friend's home. When asked if the hospital room looked like a home, she said yes.    Praxis Praxis Praxis tested?: Deficits Deficits: Initiation    Pertinent Vitals/Pain Pain Assessment: No/denies painBP 158/80     Hand Dominance Left   Extremity/Trunk Assessment  Upper Extremity Assessment Upper Extremity Assessment: LUE deficits/detail LUE Deficits / Details: Able to use functionally, but generalized weakness LUE Coordination: decreased fine motor   Lower Extremity Assessment Lower Extremity Assessment: Defer to PT evaluation    Cervical / Trunk Assessment Cervical / Trunk Assessment: Normal   Communication Communication Communication: No difficulties   Cognition Arousal/Alertness: Lethargic Behavior During Therapy: Flat affect Overall Cognitive Status: Impaired/Different from baseline Area of Impairment: Orientation;Attention;Memory;Safety/judgement;Awareness;Problem solving Orientation Level: Disoriented to;Place;Time;Situation (At someone's house; May; does not know why she is here) Current Attention Level: Sustained Memory: Decreased short-term memory (poor recall after 1 minute delay 0/3)   Safety/Judgement: Decreased awareness of safety;Decreased awareness of deficits Awareness: Intellectual Problem Solving: Slow processing;Decreased initiation;Difficulty sequencing;Requires verbal cues;Requires tactile cues General Comments: Attempting to walk to door. Pt running into the bed on her left and unable to problem solve how to move around it.   General Comments   Son with several questions regarding rehab. Discussed rehab process and deferred further ? Regarding rehab to CIR case managers.  Educated son on need to minimize distractions when talking with his mom and need to reorient occasionally throughout the day.    Exercises       Shoulder Instructions      Home Living Family/patient expects to be discharged to:: Private residence Living Arrangements: Alone Available Help at Discharge: Family;Available 24 hours/day Type of Home: House Home Access: Stairs to enter [employer] of Steps: 2 Entrance Stairs-Rails: Right Home Layout: One level     Bathroom Shower/Tub: Occupational psychologist: Handicapped height Bathroom Accessibility: Yes How Accessible: Accessible via walker Home Equipment: [place] - single point   Additional Comments: pt drove and generally did for herself; active with bowling. Has husband who does not live with her.      Prior Functioning/Environment  Level of Independence: Independent             OT Diagnosis: Generalized weakness;Cognitive deficits;Disturbance of vision   OT Problem List: Decreased strength;Decreased range of motion;Decreased activity tolerance;Impaired balance (sitting and/or standing);Impaired vision/perception;Decreased coordination;Decreased cognition;Decreased safety awareness;Decreased knowledge of use of DME or AE;Decreased knowledge of precautions;Cardiopulmonary status limiting activity   OT Treatment/Interventions: Self-care/ADL training;Therapeutic exercise;Neuromuscular education;DME and/or AE instruction;Therapeutic activities;Cognitive remediation/compensation;Visual/perceptual remediation/compensation;Patient/family education;Balance training    OT Goals(Current goals can be found in the care plan section) Acute Rehab OT Goals Patient Stated Goal: i want to get better OT Goal Formulation: With patient Time For Goal Achievement: 04/21/15 Potential to Achieve Goals: Good ADL Goals Pt Will Perform Upper Body Bathing: with set-up;sitting Pt Will Perform Lower Body Bathing: with set-up;sit to/from stand Pt Will Perform Upper Body Dressing: with set-up;sitting Pt Will Perform Lower Body Dressing: with set-up;sit to/from stand Pt Will Transfer to Toilet: with supervision;ambulating Additional ADL Goal #1: Pt will demonstrate emergent awareness during ADL task in minimally distracting environment  OT Frequency: Min 3X/week   Barriers to D/C:            Co-evaluation              End of Session Equipment Utilized During Treatment: Gait belt Nurse Communication: Mobility status;Other (comment) (need for chair alarm)  Activity Tolerance: Patient tolerated treatment well Patient left: in chair;with family/visitor present   Time: 9326-7124 OT Time Calculation (min): 38 min Charges:  OT General Charges $OT Visit: 1 Procedure OT Evaluation $Initial OT Evaluation Tier I: 1 Procedure OT  Treatments $Self Care/Home Management : 23-37 mins G-Codes:    Zina Pitzer,HILLARY May 04, 2015, 11:48  AM   [facility], OTR/L  [phone] 04/07/2015

## 2015-04-08 ENCOUNTER — Inpatient Hospital Stay (HOSPITAL_COMMUNITY): Payer: Medicare Other

## 2015-04-08 DIAGNOSIS — I10 Essential (primary) hypertension: Secondary | ICD-10-CM

## 2015-04-08 LAB — GLUCOSE, CAPILLARY
GLUCOSE-CAPILLARY: 138 mg/dL — AB (ref 65–99)
GLUCOSE-CAPILLARY: 132 mg/dL — AB (ref 65–99)
GLUCOSE-CAPILLARY: 128 mg/dL — AB (ref 65–99)
Glucose-Capillary: 128 mg/dL — ABNORMAL HIGH (ref 65–99)

## 2015-04-08 LAB — BASIC METABOLIC PANEL
Anion gap: 12 (ref 5–15)
BUN: 10 mg/dL (ref 6–20)
CHLORIDE: 109 mmol/L (ref 101–111)
CO2: 17 mmol/L — AB (ref 22–32)
Calcium: 9 mg/dL (ref 8.9–10.3)
Creatinine, Ser: 1.11 mg/dL — ABNORMAL HIGH (ref 0.44–1.00)
GFR calc non Af Amer: 45 mL/min — ABNORMAL LOW (ref >60)
GFR, EST AFRICAN AMERICAN: 52 mL/min — AB (ref >60)
Glucose, Bld: 138 mg/dL — ABNORMAL HIGH (ref 65–99)
POTASSIUM: 4.3 mmol/L (ref 3.5–5.1)
SODIUM: 138 mmol/L (ref 135–145)

## 2015-04-08 LAB — CBC
HEMATOCRIT: 36.5 % (ref 36.0–46.0)
HEMOGLOBIN: 13.1 g/dL (ref 12.0–15.0)
MCH: 27.2 pg (ref 26.0–34.0)
MCHC: 35.9 g/dL (ref 30.0–36.0)
MCV: 75.7 fL — ABNORMAL LOW (ref 78.0–100.0)
Platelets: UNDETERMINED 10*3/uL (ref 150–400)
RBC: 4.82 MIL/uL (ref 3.87–5.11)
RDW: 17.3 % — ABNORMAL HIGH (ref 11.5–15.5)
WBC: 9.9 10*3/uL (ref 4.0–10.5)

## 2015-04-08 MED ORDER — HYDRALAZINE HCL 20 MG/ML IJ SOLN
5.0000 mg | INTRAMUSCULAR | Status: DC | PRN
  Administered 2015-04-08: 10 mg via INTRAVENOUS
  Administered 2015-04-10: 20 mg via INTRAVENOUS
  Filled 2015-04-08: qty 1

## 2015-04-08 MED ORDER — HEPARIN SODIUM (PORCINE) 5000 UNIT/ML IJ SOLN
5000.0000 [IU] | Freq: Three times a day (TID) | INTRAMUSCULAR | Status: DC
  Administered 2015-04-08 – 2015-04-12 (×14): 5000 [IU] via SUBCUTANEOUS
  Filled 2015-04-08 (×14): qty 1

## 2015-04-08 MED ORDER — HYDRALAZINE HCL 20 MG/ML IJ SOLN
INTRAMUSCULAR | Status: AC
  Filled 2015-04-08: qty 1

## 2015-04-08 MED ORDER — CLEVIDIPINE BUTYRATE 0.5 MG/ML IV EMUL
0.0000 mg/h | INTRAVENOUS | Status: DC
  Administered 2015-04-08: 2 mg/h via INTRAVENOUS
  Administered 2015-04-08: 1 mg/h via INTRAVENOUS
  Administered 2015-04-09: 6 mg/h via INTRAVENOUS
  Administered 2015-04-09: 2 mg/h via INTRAVENOUS
  Administered 2015-04-09 – 2015-04-10 (×2): 5 mg/h via INTRAVENOUS
  Administered 2015-04-10: 6 mg/h via INTRAVENOUS
  Filled 2015-04-08: qty 50
  Filled 2015-04-08 (×2): qty 100
  Filled 2015-04-08: qty 50
  Filled 2015-04-08 (×3): qty 100

## 2015-04-08 MED ORDER — LISINOPRIL 20 MG PO TABS
20.0000 mg | ORAL_TABLET | Freq: Two times a day (BID) | ORAL | Status: DC
  Administered 2015-04-08 – 2015-04-12 (×7): 20 mg via ORAL
  Filled 2015-04-08 (×10): qty 1

## 2015-04-08 NOTE — Progress Notes (Signed)
UR COMPLETED  

## 2015-04-08 NOTE — Progress Notes (Signed)
SLP Cancellation Note  Patient Details Name: Nicole [name] MRN: [id number] DOB: Sep 21, 1930   Cancelled treatment:       Reason Eval/Treat Not Completed: Patient's level of consciousness. Will f/u next date for readiness.    Juan Quam Laurice 04/08/2015, 3:36 PM

## 2015-04-08 NOTE — Progress Notes (Signed)
Noted pt had speech eval (not completed per note). Was informed in report pt very lethargic. Pt easy to arose, answers questions appropriately, and follow commands. Called MD to clarify orders and question PO night meds with pt more alert. Ordered to remain NPO until speech evals, continue cleviprex. Will continue to monitor.

## 2015-04-08 NOTE — Progress Notes (Signed)
Pt BP 185/101 prn labetalol given. Max dose of labetalol reached. MD Erlinda Hong notified. Order for prn hydralazine given. Will continue to monitor.

## 2015-04-08 NOTE — Progress Notes (Signed)
STROKE TEAM PROGRESS NOTE   SUBJECTIVE (INTERVAL HISTORY) Families are at the bedside. She is drowsy sleepy today, blood pressure is still high, maxed out the labetalol IV PRN. Repeat CT head stable. Not able to take by mouth due to drowsiness. Transfer back to ICU for blood pressure management.   OBJECTIVE Temp:  [97.9 F (36.6 C)-100.1 F (37.8 C)] 98.7 F (37.1 C) (09/16 1230) Pulse Rate:  [61-93] 74 (09/16 1230) Cardiac Rhythm:  [-] Atrial fibrillation (09/16 1115) Resp:  [14-29] 17 (09/16 1230) BP: (144-187)/(78-103) 173/100 mmHg (09/16 1230) SpO2:  [93 %-100 %] 99 % (09/16 1230) Weight:  [177 lb 7.5 oz (80.5 kg)-177 lb 14.6 oz (80.7 kg)] 177 lb 14.6 oz (80.7 kg) (09/16 1109)   Recent Labs Lab 04/07/15 1134 04/07/15 1550 04/07/15 2304 04/08/15 0808 04/08/15 1235  GLUCAP 172* 122* 140* 138* 132*    Recent Labs Lab 04/04/15 0945 04/05/15 0819 04/06/15 0217 04/07/15 0250 04/08/15 0359  NA 142 144 143 140 138  K 3.8 3.3* 3.2* 3.2* 4.3  CL 106 110 111 106 109  CO2 24 26 24 23  17*  GLUCOSE 198* 139* 130* 148* 138*  BUN 14 10 11 9 10   CREATININE 1.35* 1.10* 1.32* 1.19* 1.11*  CALCIUM 9.1 8.9 8.9 8.9 9.0    Recent Labs Lab 04/04/15 0945  AST 22  ALT 10*  ALKPHOS 91  BILITOT 1.0  PROT 7.8  ALBUMIN 3.6    Recent Labs Lab 04/04/15 0945 04/05/15 0819 04/06/15 0217 04/07/15 0250 04/08/15 0359  WBC 7.1 7.5 6.9 9.4 9.9  NEUTROABS 6.1  --   --   --   --   HGB 12.8 11.7* 11.6* 11.8* 13.1  HCT 37.4 34.2* 34.0* 35.2* 36.5  MCV 77.1* 76.5* 76.6* 76.7* 75.7*  PLT 329 291 270 268 PLATELET CLUMPS NOTED ON SMEAR, UNABLE TO ESTIMATE   No results for input(s): CKTOTAL, CKMB, CKMBINDEX, TROPONINI in the last 168 hours. No results for input(s): LABPROT, INR in the last 72 hours.  Recent Labs  04/06/15 1140  COLORURINE YELLOW  LABSPEC 1.013  PHURINE 6.5  GLUCOSEU 100*  HGBUR TRACE*  BILIRUBINUR NEGATIVE  KETONESUR NEGATIVE  PROTEINUR 100*  UROBILINOGEN 1.0   NITRITE NEGATIVE  LEUKOCYTESUR NEGATIVE       Component Value Date/Time   CHOL 161 04/05/2015 0819   CHOL 176 02/17/2015 0810   TRIG 104 04/05/2015 0819   HDL 43 04/05/2015 0819   HDL 70 02/17/2015 0810   CHOLHDL 3.7 04/05/2015 0819   CHOLHDL 2.5 02/17/2015 0810   VLDL 21 04/05/2015 0819   LDLCALC 97 04/05/2015 0819   LDLCALC 92 02/17/2015 0810   Lab Results  Component Value Date   HGBA1C 5.9* 04/05/2015   No results found for: LABOPIA, COCAINSCRNUR, LABBENZ, AMPHETMU, THCU, LABBARB  No results for input(s): ETH in the last 168 hours.  I have personally reviewed the radiological images below and agree with the radiology interpretations.  Dg Chest 2 View  04/04/2015    IMPRESSION: Stable mild cardiac enlargement.  No active disease.     Ct Head Wo Contrast  04/08/2015   IMPRESSION: 1. Unchanged right basal ganglia hematoma with intraventricular extension and small subarachnoid blood. 2. Stable mild ventriculomegaly.    04/07/2015   IMPRESSION: Evolving RIGHT basal ganglia hematoma with intraventricular extension. Mild RIGHT ventricular entrapment.  Increasing small to moderate amount of subarachnoid blood products are likely re- distributed.     04/05/2015   IMPRESSION: Size stable ~ 4 cm  right basal ganglia hematoma. Intraventricular hemorrhage has redistributed, accounting for newly seen trace subarachnoid blood. Obstructive right lateral ventriculomegaly which is stable.    04/04/2015   IMPRESSION: Large acute hemorrhage centered in the region of the right caudate nucleus with rupture into the lateral ventricle and evidence of developing hydrocephalus and trapping of the right lateral ventricle. There is a mild amount of midline shift to the left. Parenchymal hemorrhage measures roughly 3.2 x 4.6 x 3.0 cm. This is most likely a hypertensive parenchymal bleed.    2D Echocardiogram  - Left ventricle: Systolic function was normal. The estimated ejection fraction was in the range  of 55% to 60%. - Aortic valve: There was mild regurgitation. - Mitral valve: There was mild to moderate regurgitation directed centrally. - Left atrium: The atrium was moderately dilated. - Right atrium: The atrium was moderately dilated. - Pulmonic valve: There was moderate regurgitation directed centrally. - Pulmonary arteries: Systolic pressure was mildly increased. PA peak pressure: 43 mm Hg (S).  EKG  atrial fibrillation, rate 82. For complete results please see formal report.  PHYSICAL EXAM  Temp:  [97.9 F (36.6 C)-100.1 F (37.8 C)] 98.7 F (37.1 C) (09/16 1230) Pulse Rate:  [61-93] 74 (09/16 1230) Resp:  [14-29] 17 (09/16 1230) BP: (144-187)/(78-103) 173/100 mmHg (09/16 1230) SpO2:  [93 %-100 %] 99 % (09/16 1230) Weight:  [177 lb 7.5 oz (80.5 kg)-177 lb 14.6 oz (80.7 kg)] 177 lb 14.6 oz (80.7 kg) (09/16 1109)  General - Well nourished, well developed, drowsy, sleepy, difficult to remain awake.  Ophthalmologic - fundi not visualized due to noncooperation.  Cardiovascular - irregularly irregular heart rate and rhythm.  Mental Status -  Level of arousal and orientation to place and people was intact, but not orientated to time or age. Language exam not incorporated due to drowsiness.  Cranial Nerves II - XII - II - Visual field intact OU. III, IV, VI - Extraocular movements intact. V - Facial sensation intact bilaterally. VII - right nasolabial fold mild flattening. VIII - Hearing & vestibular intact bilaterally. X - Palate elevates symmetrically. XI - Chin turning & shoulder shrug intact bilaterally. XII - Tongue protrusion intact.  Motor Strength - The patient's strength was 4/5 in all extremities and pronator drift was absent.  Bulk was normal and fasciculations were absent.   Motor Tone - Muscle tone was assessed at the neck and appendages and was normal.  Reflexes - The patient's reflexes were symmetrical in all extremities and she had no pathological  reflexes.  Sensory - Light touch, temperature/pinprick were assessed and were symmetrical.    Coordination - not cooperative on exam.  Tremor was absent.  Gait and Station - deferred due to safety concerns.   ASSESSMENT/PLAN [name] is a 81 y.o. female with history of HTN, DM, CHF, HLD, afib on coumadin, and CKD admitted for right caudate head ICH with IVH. Symptoms stable.    ICH:  Right caudate head with ventricular extension likely secondary to HTN  CT repeat x 3 showed stable hematoma and obstructive right lateral ventriculomegaly   Drowsiness likely due to cerebral edema  2D Echo  EF 55-60%  LDL 97  HgbA1c 5.9  Heparin subcutaneous for VTE prophylaxis  Hold off diet for now, need speech eval  warfarin prior to admission, now on no antithrombotic  Ongoing aggressive stroke risk factor management  Therapy recommendations:  pending  Disposition:  Pending  Obstructive hydrocephalus  Right mild ventriculomagely  No indication  for intervention now  Repeat CT head stable hematoma and hydrocephalus  Afib on coumadin at home  Still has afib rhythm on tele  On cardizam and metoprolol at home  Hold off coumadin  Rate controlled  Diabetes  HgbA1c 5.9 goal < 7.0  Controlled  CBG monitoring  SSI  Hypertension, accelerated  Home meds:   cardizem and metoprolol BP goal < 160. Currently drowsy sleepy, not able to take by mouth Transferred to ICU, cleviprix drip   Hyperlipidemia  Home meds:  zocor 40   Currently on none due to [place]  LDL 97, goal < 70  Resume statin at discharge  CKD, stage III  Monitor Cre 1.32 -> 1.19  Avoid nephrotoxic agent  Continue IVF for now  Other Stroke Risk Factors  Advanced age  Other Active Problems  Mild anemia  hypokalemia  Other Pertinent History    Hospital day # 4  This patient is critically ill due to [place] with IVH and hydrocephalus and afib not on AC and at significant risk of  neurological worsening, death form cerebral edema, recurrent ICH, obstructive hydrocephalus, brain herniation, ischemic stroke due to afib not on AC. This patient's care requires constant monitoring of vital signs, hemodynamics, respiratory and cardiac monitoring, review of multiple databases, neurological assessment, discussion with family, other specialists and medical decision making of high complexity. I spent 40 minutes of neurocritical care time in the care of this patient.   Rosalin Hawking, MD PhD Stroke Neurology 04/08/2015 1:53 PM    To contact Stroke Continuity provider, please refer to [email or web address] After hours, contact General Neurology

## 2015-04-08 NOTE — Progress Notes (Signed)
*  PRELIMINARY RESULTS* Vascular Ultrasound Renal Artery Duplex has been completed.  Study was technically difficult due to patient's inability to cooperate, respiratory and overlying bowel gas interference. Findings suggest 1-59% renal artery stenosis of the visualized renal arteries bilaterally.  04/08/2015 9:24 AM Maudry Mayhew, RVT, RDCS, RDMS

## 2015-04-08 NOTE — Progress Notes (Signed)
PT Cancellation Note  Patient Details Name: Nicole [name] MRN: [id number] DOB: 05/09/1931   Cancelled Treatment:    Reason Eval/Treat Not Completed: Patient not medically ready.  Pt going back to the unit.  BP's are climbing too high for mobilization. 04/08/2015  Donnella Sham, [place] [phone]  (pager)   [place], [name] 04/08/2015, 11:58 AM

## 2015-04-08 NOTE — H&P (Signed)
Physical Medicine and Rehabilitation Admission H&P    Chief Complaint  Patient presents with  . Atrial Fibrillation  : HPI: Nicole [name] is a 81 y.o. right handed female with history of hypertension, systolic congestive heart failure, diabetes mellitus peripheral neuropathy, chronic renal insufficiency with baseline creatinine 1.35, atrial fibrillation on chronic Coumadin. Patient independent prior to admission still driving use a single-point cane living alone. Presented 04/04/2015 with altered mental status as well as headache. Blood pressure 166/103. INR 3.6. CT of the head showed a large acute hemorrhage centered in the region of the right caudate nucleus with rupture into the lateral ventricle and evidence of developing hydrocephalus. Parenchymal hemorrhage measuring roughly 3.2 x 4.6 x 3.0 cm. Neurosurgery Dr. Saintclair Halsted consulted advise conservative care. Coumadin was reversed. Follow-up serial cranial CT scans 04/07/2015 showing evolving right basal ganglia hematoma with intraventricular extension. Echocardiogram with ejection fraction of 34% normal systolic function. Renal artery duplex with findings of 1-59 % renal artery stenosis and close monitoring of blood pressure was advised. Maintained on a mechanical soft diet.. Subcutaneous heparin added for DVT prophylaxis 04/08/2015. Physical and occupational therapy evaluations completed 04/06/2015 with recommendations of physical medicine rehabilitation consult. Patient was admitted for a comprehensive rehabilitation program   ROS Review of Systems  Constitutional: Negative for fever and chills.  HENT: Negative for hearing loss.  Eyes: Positive for blurred vision. Negative for double vision.  Respiratory: Negative for cough.   Shortness of breath with exertion  Cardiovascular: Positive for palpitations and leg swelling. Negative for chest pain.  Gastrointestinal: Positive for constipation. Negative for nausea, vomiting and abdominal  pain.   GERD  Genitourinary: Negative for dysuria and hematuria.  Musculoskeletal: Positive for myalgias and joint pain.  Neurological: Positive for dizziness and headaches. Negative for loss of consciousness.  Psychiatric/Behavioral:   Anxiety  Past Medical History  Diagnosis Date  . Colon cancer 07/1997  . Hypertension   . Diabetes mellitus   . CHF (congestive heart failure)   . AAA (abdominal aortic aneurysm)   . Atrial fibrillation   . Hypercholesteremia   . Kidney stone     renal calculi  . Small bowel obstruction due to adhesions 05/16/2012  . Hypothyroidism   . Arthritis   . Anemia   . Cholelithiasis   . Perforation of bile duct   . Hemorrhage of rectum and anus   . Swelling, mass, or lump in head and neck   . Cervicalgia   . Hypoglycemia, unspecified   . Pain in joint, lower leg   . Anxiety   . Shortness of breath   . Acute upper respiratory infections of unspecified site   . Depression   . Thoracic aneurysm without mention of rupture   . GERD (gastroesophageal reflux disease)   . Proteinuria   . Other specified cardiac dysrhythmias(427.89)   . Congestive heart failure, unspecified   . Electrolyte and fluid disorders not elsewhere classified   . Other chronic allergic conjunctivitis   . Hypopotassemia   . Chronic systolic heart failure   . Dizziness and giddiness   . Malignant neoplasm of colon, unspecified site   . Other and unspecified hyperlipidemia   . Gout, unspecified   . Atrial fibrillation   . Aortic aneurysm of unspecified site without mention of rupture   . Pain in joint, site unspecified   . Palpitations   . CKD (chronic kidney disease)   . Diabetes mellitus    Past Surgical History  Procedure Laterality Date  .  Hemicolectomy  08/11/1997  . Kidney stone surgery    . Colon surgery      to remove colon ca  . Cholecystectomy  05/14/2012    Procedure: LAPAROSCOPIC CHOLECYSTECTOMY WITH INTRAOPERATIVE CHOLANGIOGRAM;  Surgeon:  Haywood Lasso, MD;  Location: [place];  Service: General;  Laterality: N/A;  . Ercp  05/17/2012    Procedure: ENDOSCOPIC RETROGRADE CHOLANGIOPANCREATOGRAPHY (ERCP);  Surgeon: Missy Sabins, MD;  Location: [place];  Service: Gastroenterology;  Laterality: N/A;  . Ercp  08/12/2012    Procedure: ENDOSCOPIC RETROGRADE CHOLANGIOPANCREATOGRAPHY (ERCP);  Surgeon: Missy Sabins, MD;  Location: [facility]COPY;  Service: Endoscopy;  Laterality: N/A;  . Transthoracic echocardiogram  11/2009    EF 45-50%, mild-mod AV regurg; mild MR; mod TR; ascending aorta mildly dilated  . Cardiac catheterization  2008    moderate severe pulm HTN; with elevted pulm capillary wedge pressure    Family History  Problem Relation Age of Onset  . Heart disease Mother   . Stroke Father   . Hypertension Daughter   . Hypertension Son   . Diabetes Sister   . Hypertension Son    Social History:  reports that she has never smoked. She has never used smokeless tobacco. She reports that she does not drink alcohol or use illicit drugs. Allergies:  Allergies  Allergen Reactions  . Iohexol      Code: VOM, Desc: PT REPORTS VOMITING W/ IVP DYE- ARS 12/26/08, Onset Date: [id number]   . Z-Pak [Azithromycin]   . Iodinated Diagnostic Agents Nausea Only    PT HAS HAD SINGULAR INCIDENT OF NAUSEA  W/ IV CONTYRAST INJECTION, NONE IF INJECTED SLOWLY//A.CALHOUN   Medications Prior to Admission  Medication Sig Dispense Refill  . allopurinol (ZYLOPRIM) 100 MG tablet Take 1 tablet (100 mg total) by mouth daily. 30 tablet 3  . bacitracin ointment Apply 1 application topically 2 (two) times daily. (Patient taking differently: Apply 1 application topically as needed for wound care. ) 120 g 0  . diltiazem (CARDIZEM CD) 240 MG 24 hr capsule take 1 capsule by mouth once daily 30 capsule 9  . furosemide (LASIX) 40 MG tablet take 1 tablet by mouth twice a day (Patient taking differently: take 1 tablet by mouth once a day) 60 tablet 11  . glipiZIDE  (GLUCOTROL) 10 MG tablet Take one tablet by mouth once daily to control blood sugar (Patient taking differently: Take 10 mg by mouth daily before breakfast. Take one tablet by mouth once daily to control blood sugar) 30 tablet 6  . levothyroxine (SYNTHROID, LEVOTHROID) 50 MCG tablet Take one tablet by mouth 30 minutes before breakfast once daily for thyroid (Patient taking differently: Take 50 mcg by mouth daily before breakfast. Take one tablet by mouth 30 minutes before breakfast once daily for thyroid) 90 tablet 3  . magnesium oxide (MAG-OX) 400 (241.3 MG) MG tablet Take 400 mg by mouth daily.  0  . metFORMIN (GLUCOPHAGE) 500 MG tablet Take 1 tablet (500 mg total) by mouth 2 (two) times daily with a meal. 60 tablet 5  . Multiple Vitamins-Minerals (WOMENS BONE HEALTH PO) Take 1 tablet by mouth daily.    . polyethylene glycol (MIRALAX / GLYCOLAX) packet Take 17 g by mouth daily as needed for mild constipation.     . potassium chloride SA (K-DUR,KLOR-CON) 20 MEQ tablet Take one tablet by mouth once daily for potassium (Patient taking differently: Take 20 mEq by mouth daily. Take one tablet by mouth once daily for potassium)  30 tablet 5  . simvastatin (ZOCOR) 40 MG tablet Take one tablet by mouth once daily for cholesterol (Patient taking differently: Take 40 mg by mouth daily. Take one tablet by mouth once daily for cholesterol) 30 tablet 3  . warfarin (COUMADIN) 5 MG tablet take 1 tablet by mouth once daily or as directed (Patient taking differently: Take 2.5 mg by mouth daily on Sun, Tues, Thurs, and Sat. Take 5 mg by mouth daily on Mon, Wed, and Fri.) 30 tablet 3  . metoprolol (LOPRESSOR) 25 MG tablet Take 1 tablet (25 mg total) by mouth 2 (two) times daily. (Patient not taking: Reported on 04/04/2015) 60 tablet 3    Home: [place] expects to be discharged to:: Private residence Living Arrangements: Alone Available Help at Discharge: Family, Available 24 hours/day Type of Home:  House Home Access: Stairs to enter [employer] of Steps: 2 Entrance Stairs-Rails: Right Home Layout: One level Bathroom Shower/Tub: Multimedia programmer: Handicapped height Bathroom Accessibility: Yes Home Equipment: [place] - single point Additional Comments: had friend, [name] , of 11 yrs who does not live with her  Lives With: Alone   Functional History: Prior Function Level of Independence: Independent Comments: very active and Independent  Functional Status:  Mobility: Bed Mobility Overal bed mobility: Needs Assistance Bed Mobility: Supine to Sit Supine to sit: HOB elevated, Mod assist General bed mobility comments: pt up in chair Transfers Overall transfer level: Needs assistance Equipment used: Rolling walker (2 wheeled) Transfers: Sit to/from Stand Sit to Stand: Mod assist Stand pivot transfers: Min assist General transfer comment: max directional verbal and tactile cues for anterior weight shift and to push up from chair Ambulation/Gait Ambulation/Gait assistance: Mod assist, +2 physical assistance, +2 safety/equipment Ambulation Distance (Feet): 50 Feet (x2) Assistive device: Rolling walker (2 wheeled), 2 person hand held assist Gait Pattern/deviations: Step-through pattern, Decreased stride length, Shuffle General Gait Details: trialed both with RW and bilat HHA. pt required max tactile cues to advance L LE and to clear foot. pt with improved sequencing when ambulating with bilat HHA most likely due to PT and Tech maintaining momentum and pt picking up habitual pattern Gait velocity: extremely slow Gait velocity interpretation: Below normal speed for age/gender    ADL: ADL Overall ADL's : Needs assistance/impaired Eating/Feeding: Set up Grooming: Supervision/safety, Set up Grooming Details (indicate cue type and reason): cues to attend and sequence Upper Body Bathing: Supervision/ safety, Set up, Sitting Upper Body Bathing Details (indicate  cue type and reason): cues to complete tasks and attend Lower Body Bathing: Minimal assistance, Sit to/from stand Upper Body Dressing : Moderate assistance, Sitting Lower Body Dressing: Moderate assistance, Sit to/from stand Lower Body Dressing Details (indicate cue type and reason): trying to pull up her SCD hose as if they were her socks. Kept taking sock off with cues to donn socks Toilet Transfer: Minimal assistance, Ambulation Toileting- Clothing Manipulation and Hygiene: Minimal assistance, Sit to/from stand Functional mobility during ADLs: Minimal assistance, +2 for safety/equipment, Cueing for sequencing, Cueing for safety (short shuffling gait pattern. ) General ADL Comments: difficulty with initiation of movement and attending to tasks to cmoplete  Cognition: Cognition Overall Cognitive Status: Impaired/Different from baseline Orientation Level: Oriented to person Cognition Arousal/Alertness: Lethargic Behavior During Therapy: Flat affect Overall Cognitive Status: Impaired/Different from baseline Area of Impairment: Attention, Memory, Problem solving, Following commands, Safety/judgement Orientation Level: Disoriented to, Place Current Attention Level: Sustained Memory: Decreased short-term memory Following Commands: Follows one step commands with increased time, Follows  one step commands inconsistently Safety/Judgement: Decreased awareness of deficits Awareness: Intellectual Problem Solving: Slow processing, Decreased initiation, Difficulty sequencing, Requires verbal cues, Requires tactile cues General Comments: pt very flat without initiation of any mvmt requiring max tactile cues to complete task  Physical Exam: Blood pressure 137/74, pulse 62, temperature 99.1 F (37.3 C), temperature source Oral, resp. rate 16, height 5\' 8"  (1.727 m), weight 80.7 kg (177 lb 14.6 oz), SpO2 100 %. Physical Exam HENT:  Head: Normocephalic.  Eyes:  Pupils sluggish but reactive to light   Neck: Normal range of motion. Neck supple. No thyromegaly present.  Cardiovascular:  Irregular irregular  Respiratory: Effort normal and breath sounds normal. No respiratory distress.  GI: Soft. Bowel sounds are normal. She exhibits no distension.  Neurological: She is alert.  Mood is flat but appropriate. She makes good eye contact with examiner. Follows simple commands. Fair awareness of her deficits.  Motor strength is 2+ to 3- in the left deltoid, biceps, triceps, grip 3 minus in the left hip flexor and knee extensor; 2 minus in the left ankle and flex plantar flexor 4/5 Right deltoid, biceps, triceps, grip, hip flexor, knee extensor, ankle dorsi flexor and plantar flexor Reduced sensation to touch bilateral feet and bilateral hands Decreased attention and awareness. Needs cues to stay on task. ?mild left sided inattention. Skin: grossly intact Psych: flat, generally cooperative   Results for orders placed or performed during the hospital encounter of 04/04/15 (from the past 48 hour(s))  Glucose, capillary     Status: None   Collection Time: 04/10/15  5:56 PM  Result Value Ref Range   Glucose-Capillary 96 65 - 99 mg/dL  Glucose, capillary     Status: Abnormal   Collection Time: 04/10/15  9:49 PM  Result Value Ref Range   Glucose-Capillary 132 (H) 65 - 99 mg/dL  Glucose, capillary     Status: Abnormal   Collection Time: 04/11/15  8:03 AM  Result Value Ref Range   Glucose-Capillary 148 (H) 65 - 99 mg/dL  Glucose, capillary     Status: Abnormal   Collection Time: 04/11/15 11:36 AM  Result Value Ref Range   Glucose-Capillary 167 (H) 65 - 99 mg/dL  Glucose, capillary     Status: Abnormal   Collection Time: 04/11/15  4:07 PM  Result Value Ref Range   Glucose-Capillary 122 (H) 65 - 99 mg/dL  Glucose, capillary     Status: Abnormal   Collection Time: 04/11/15 10:21 PM  Result Value Ref Range   Glucose-Capillary 122 (H) 65 - 99 mg/dL   Comment 1 Notify RN    Comment 2 Document  in Chart   Glucose, capillary     Status: Abnormal   Collection Time: 04/12/15  6:37 AM  Result Value Ref Range   Glucose-Capillary 128 (H) 65 - 99 mg/dL  Glucose, capillary     Status: Abnormal   Collection Time: 04/12/15 11:30 AM  Result Value Ref Range   Glucose-Capillary 116 (H) 65 - 99 mg/dL   Comment 1 Notify RN    Comment 2 Document in Chart   Glucose, capillary     Status: Abnormal   Collection Time: 04/12/15  4:18 PM  Result Value Ref Range   Glucose-Capillary 157 (H) 65 - 99 mg/dL   Comment 1 Notify RN    Comment 2 Document in Chart    Dg Swallowing Func-speech Pathology  04/11/2015    Objective Swallowing Evaluation:   MBS Patient Details  Name: Nicole [name]  MRN: [id number] Date of Birth: 1930-10-10  Today's Date: 04/11/2015 Time: SLP Start Time (ACUTE ONLY): 1033-SLP Stop Time (ACUTE ONLY): 1048 SLP Time Calculation (min) (ACUTE ONLY): 15 min  Past Medical History:  Past Medical History  Diagnosis Date  . Colon cancer 07/1997  . Hypertension   . Diabetes mellitus   . CHF (congestive heart failure)   . AAA (abdominal aortic aneurysm)   . Atrial fibrillation   . Hypercholesteremia   . Kidney stone     renal calculi  . Small bowel obstruction due to adhesions 05/16/2012  . Hypothyroidism   . Arthritis   . Anemia   . Cholelithiasis   . Perforation of bile duct   . Hemorrhage of rectum and anus   . Swelling, mass, or lump in head and neck   . Cervicalgia   . Hypoglycemia, unspecified   . Pain in joint, lower leg   . Anxiety   . Shortness of breath   . Acute upper respiratory infections of unspecified site   . Depression   . Thoracic aneurysm without mention of rupture   . GERD (gastroesophageal reflux disease)   . Proteinuria   . Other specified cardiac dysrhythmias(427.89)   . Congestive heart failure, unspecified   . Electrolyte and fluid disorders not elsewhere classified   . Other chronic allergic conjunctivitis   . Hypopotassemia   . Chronic systolic heart failure   . Dizziness and  giddiness   . Malignant neoplasm of colon, unspecified site   . Other and unspecified hyperlipidemia   . Gout, unspecified   . Atrial fibrillation   . Aortic aneurysm of unspecified site without mention of rupture   . Pain in joint, site unspecified   . Palpitations   . CKD (chronic kidney disease)   . Diabetes mellitus    Past Surgical History:  Past Surgical History  Procedure Laterality Date  . Hemicolectomy  08/11/1997  . Kidney stone surgery    . Colon surgery      to remove colon ca  . Cholecystectomy  05/14/2012    Procedure: LAPAROSCOPIC CHOLECYSTECTOMY WITH INTRAOPERATIVE  CHOLANGIOGRAM;  Surgeon: Haywood Lasso, MD;  Location: [place];   Service: General;  Laterality: N/A;  . Ercp  05/17/2012    Procedure: ENDOSCOPIC RETROGRADE CHOLANGIOPANCREATOGRAPHY (ERCP);   Surgeon: Missy Sabins, MD;  Location: [place];  Service: Gastroenterology;   Laterality: N/A;  . Ercp  08/12/2012    Procedure: ENDOSCOPIC RETROGRADE CHOLANGIOPANCREATOGRAPHY (ERCP);   Surgeon: Missy Sabins, MD;  [facility] ENDOSCOPY;  Service: Endoscopy;   Laterality: N/A;  . Transthoracic echocardiogram  11/2009    EF 45-50%, mild-mod AV regurg; mild MR; mod TR; ascending aorta mildly  dilated  . Cardiac catheterization  2008    moderate severe pulm HTN; with elevted pulm capillary wedge pressure    HPI:  Other Pertinent Information: DAJAE KIZER is an 81 y.o. female history  of atrial fibrillation on Coumadin, hypertension, hyperlipidemia, diabetes  mellitus, CHF and chronic kidney disease, brought to the emergency room  after being found on the floor home. CT scan of her head showed fairly  large intracerebral hemorrhage involving the right caudate and extending  into right ventricle with mass effect with mild midline shift as well as  right ventricular enlargement. evaluated by Dr. Saintclair Halsted of the Neurosurgery  Service. Ventricular drain is not felt to be warranted at this time.  No Data Recorded  Assessment / Plan / Recommendation CHL IP  CLINICAL  IMPRESSIONS 04/11/2015  Therapy Diagnosis Moderate oral phase dysphagia;Mild pharyngeal phase  dysphagia  Clinical Impression Patient presents with a primarily oral phase dysphagia  with secondary pharyngeal component. Oral phase marked by delayed oral  transit with tongue pumping, requiring moderate verbal cueing for  initiation of swallow, all of which likely due to cognitive deficits  (decreased awareness and attention). Self feeding increases A-P oral  transit time. The combination of above results in intermittently delayed  swallow initiation and trace penetration of thin liquids via straw which  consistently clear either during the swallow or after with spontaneous  throat clear/cough. Mild vallecular residuals remain post swallow due to  base of tongue weakness do increase aspiration risk as patient unable to  complete cued dry swallow in attempts to clear. Recommend initiation of  dysphagia 3 solids, thin liquids with full supervision to assist with cues  for rapid oral transit as needed. Allow to self feed.       CHL IP TREATMENT RECOMMENDATION 04/11/2015  Treatment Recommendations Therapy as outlined in treatment plan below     CHL IP DIET RECOMMENDATION 04/11/2015  SLP Diet Recommendations Dysphagia 3 (Mech soft);Thin  Liquid Administration via (None)  Medication Administration Crushed with puree  Compensations Slow rate;Small sips/bites  Postural Changes and/or Swallow Maneuvers (None)     CHL IP OTHER RECOMMENDATIONS 04/11/2015  Recommended Consults (None)  Oral Care Recommendations Oral care BID  Other Recommendations (None)     CHL IP FOLLOW UP RECOMMENDATIONS 04/10/2015  Follow up Recommendations (No Data)     CHL IP FREQUENCY AND DURATION 04/11/2015  Speech Therapy Frequency (ACUTE ONLY) min 3x week  Treatment Duration 2 weeks             CHL IP REASON FOR REFERRAL 04/11/2015  Reason for Referral Objectively evaluate swallowing function            Gabriel Rainwater MA, CCC-SLP [phone]         McCoy  Leah Meryl 04/11/2015, 11:35 AM        Medical Problem List and Plan: 1. Functional deficits secondary to right caudate intercerebral hemorrhage secondary to hypertensive crisis 2.  DVT Prophylaxis/Anticoagulation: Subcutaneous heparin added for DVT prophylaxis 04/08/2015 3. Pain Management: Tylenol as needed 4. Hypertension. Clonidine 0.2 mg 3 times a day, Cardizem 240 mg daily, lisinopril 20 mg twice a day, Lopressor 25 mg twice a day. Will allow a degree of "permissive hypertension" to maintain perfusion, but needs better control. 5. Neuropsych: This patient is capable of making decisions on her own behalf. 6. Skin/Wound Care: Routine skin checks 7. Fluids/Electrolytes/Nutrition: Routine I&O with follow-up chemistries 8. Systolic congestive heart failure. Check daily weights and I's and O's. Patient on Lasix 40 mg daily prior to admission. Resume as needed 9. Atrial fibrillation. Chronic Coumadin discontinued secondary to intracerebral hemorrhage. Cardiac rate controlled. Continue Cardizem and Lopressor 10. Diabetes mellitus peripheral neuropathy. Hemoglobin A1c 5.9. Sliding scale insulin. Check blood sugars before meals and at bedtime. Patient on Glucophage 500 mg twice a day prior to admission. Resume depending on cbg patterns. 11. Chronic renal insufficiency. Baseline creatinine 1.35. Follow-up chemistries in am. Past labs all reviewed. 12. Hypothyroidism. TSH level 2.053. Continue Synthroid 13. History of gout. Zyloprim 100 mg daily. Not active currently  14. Hyperlipidemia. Resume statin at discharge.   Post Admission Physician Evaluation: 1. Functional deficits secondary  to right caudate ICH. 2. Patient is admitted to receive collaborative, interdisciplinary care between the physiatrist, rehab nursing staff, and therapy team. 3.  Patient's level of medical complexity and substantial therapy needs in context of that medical necessity cannot be provided at a lesser intensity of care  such as a SNF. 4. Patient has experienced substantial functional loss from his/her baseline which was documented above under the "Functional History" and "Functional Status" headings.  Judging by the patient's diagnosis, physical exam, and functional history, the patient has potential for functional progress which will result in measurable gains while on inpatient rehab.  These gains will be of substantial and practical use upon discharge  in facilitating mobility and self-care at the household level. 5. Physiatrist will provide 24 hour management of medical needs as well as oversight of the therapy plan/treatment and provide guidance as appropriate regarding the interaction of the two. 6. 24 hour rehab nursing will assist with bladder management, bowel management, safety, skin/wound care, disease management, medication administration, pain management and patient education  and help integrate therapy concepts, techniques,education, etc. 7. PT will assess and treat for/with: Lower extremity strength, range of motion, stamina, balance, functional mobility, safety, adaptive techniques and equipment, NMR, visual-perceptual and cognitive perceptual awareness.   Goals are: supervision. 8. OT will assess and treat for/with: ADL's, functional mobility, safety, upper extremity strength, adaptive techniques and equipment, NMR, visual-perceptual and cognitive perceptual awareness, family education, community reintegration.   Goals are: supervision to min assist. Therapy may proceed with showering this patient. 9. SLP will assess and treat for/with: cognition, communication, family ed.  Goals are: supervision to min assist. 10. Case Management and Social Worker will assess and treat for psychological issues and discharge planning. 11. Team conference will be held weekly to assess progress toward goals and to determine barriers to discharge. 12. Patient will receive at least 3 hours of therapy per day at least 5 days  per week. 13. ELOS: 12-18 days       14. Prognosis:  excellent     Meredith Staggers, MD, [facility] 04/12/2015   04/12/2015

## 2015-04-08 NOTE — Progress Notes (Signed)
Noted transfer to ICU. I will follow up with pt and family Monday to assist with planning rehab dispo pending her progress. [phone]

## 2015-04-09 ENCOUNTER — Inpatient Hospital Stay (HOSPITAL_COMMUNITY): Payer: Medicare Other

## 2015-04-09 DIAGNOSIS — G4720 Circadian rhythm sleep disorder, unspecified type: Secondary | ICD-10-CM

## 2015-04-09 LAB — CBC
HEMATOCRIT: 35.7 % — AB (ref 36.0–46.0)
Hemoglobin: 12.3 g/dL (ref 12.0–15.0)
MCH: 26.4 pg (ref 26.0–34.0)
MCHC: 34.5 g/dL (ref 30.0–36.0)
MCV: 76.6 fL — ABNORMAL LOW (ref 78.0–100.0)
Platelets: 276 10*3/uL (ref 150–400)
RBC: 4.66 MIL/uL (ref 3.87–5.11)
RDW: 17.1 % — AB (ref 11.5–15.5)
WBC: 10.8 10*3/uL — AB (ref 4.0–10.5)

## 2015-04-09 LAB — GLUCOSE, CAPILLARY
Glucose-Capillary: 161 mg/dL — ABNORMAL HIGH (ref 65–99)
Glucose-Capillary: 175 mg/dL — ABNORMAL HIGH (ref 65–99)
Glucose-Capillary: 121 mg/dL — ABNORMAL HIGH (ref 65–99)
Glucose-Capillary: 148 mg/dL — ABNORMAL HIGH (ref 65–99)

## 2015-04-09 LAB — BASIC METABOLIC PANEL
ANION GAP: 13 (ref 5–15)
BUN: 13 mg/dL (ref 6–20)
CALCIUM: 9 mg/dL (ref 8.9–10.3)
CO2: 20 mmol/L — AB (ref 22–32)
Chloride: 103 mmol/L (ref 101–111)
Creatinine, Ser: 0.97 mg/dL (ref 0.44–1.00)
GFR, EST NON AFRICAN AMERICAN: 53 mL/min — AB (ref >60)
Glucose, Bld: 143 mg/dL — ABNORMAL HIGH (ref 65–99)
POTASSIUM: 3.3 mmol/L — AB (ref 3.5–5.1)
Sodium: 136 mmol/L (ref 135–145)

## 2015-04-09 MED ORDER — POTASSIUM CHLORIDE CRYS ER 20 MEQ PO TBCR
40.0000 meq | EXTENDED_RELEASE_TABLET | ORAL | Status: DC
  Filled 2015-04-09: qty 2

## 2015-04-09 MED ORDER — POTASSIUM CHLORIDE 10 MEQ/100ML IV SOLN
10.0000 meq | INTRAVENOUS | Status: AC
  Administered 2015-04-09 (×6): 10 meq via INTRAVENOUS
  Filled 2015-04-09 (×7): qty 100

## 2015-04-09 MED ORDER — ACETAMINOPHEN 650 MG RE SUPP
650.0000 mg | RECTAL | Status: DC | PRN
  Administered 2015-04-09 (×2): 650 mg via RECTAL
  Filled 2015-04-09 (×2): qty 1

## 2015-04-09 MED ORDER — SODIUM CHLORIDE 0.9 % IV SOLN
INTRAVENOUS | Status: DC
  Administered 2015-04-09 – 2015-04-10 (×2): 1000 mL via INTRAVENOUS
  Administered 2015-04-10 – 2015-04-11 (×2): via INTRAVENOUS

## 2015-04-09 NOTE — Progress Notes (Signed)
STROKE TEAM PROGRESS NOTE   SUBJECTIVE (INTERVAL HISTORY) Families are at the bedside. She is more awake today, blood pressure is still high, on cleviprix. Repeat CT yesterday stable. Still did not pass swallow today, but NPO except meds. Will resume po meds. Put on IVF. Pt did not sleep well last night.   OBJECTIVE Temp:  [97.7 F (36.5 C)-99.2 F (37.3 C)] 99 F (37.2 C) (09/17 1200) Pulse Rate:  [25-129] 87 (09/17 1400) Cardiac Rhythm:  [-] Atrial fibrillation (09/17 1300) Resp:  [14-29] 22 (09/17 1400) BP: (112-175)/(62-99) 129/72 mmHg (09/17 1400) SpO2:  [94 %-100 %] 96 % (09/17 1400)   Recent Labs Lab 04/08/15 1235 04/08/15 1657 04/08/15 2153 04/09/15 0754 04/09/15 1131  GLUCAP 132* 128* 128* 161* 175*    Recent Labs Lab 04/05/15 0819 04/06/15 0217 04/07/15 0250 04/08/15 0359 04/09/15 0229  NA 144 143 140 138 136  K 3.3* 3.2* 3.2* 4.3 3.3*  CL 110 111 106 109 103  CO2 26 24 23  17* 20*  GLUCOSE 139* 130* 148* 138* 143*  BUN 10 11 9 10 13   CREATININE 1.10* 1.32* 1.19* 1.11* 0.97  CALCIUM 8.9 8.9 8.9 9.0 9.0    Recent Labs Lab 04/04/15 0945  AST 22  ALT 10*  ALKPHOS 91  BILITOT 1.0  PROT 7.8  ALBUMIN 3.6    Recent Labs Lab 04/04/15 0945 04/05/15 0819 04/06/15 0217 04/07/15 0250 04/08/15 0359 04/09/15 0229  WBC 7.1 7.5 6.9 9.4 9.9 10.8*  NEUTROABS 6.1  --   --   --   --   --   HGB 12.8 11.7* 11.6* 11.8* 13.1 12.3  HCT 37.4 34.2* 34.0* 35.2* 36.5 35.7*  MCV 77.1* 76.5* 76.6* 76.7* 75.7* 76.6*  PLT 329 291 270 268 PLATELET CLUMPS NOTED ON SMEAR, UNABLE TO ESTIMATE 276   No results for input(s): CKTOTAL, CKMB, CKMBINDEX, TROPONINI in the last 168 hours. No results for input(s): LABPROT, INR in the last 72 hours. No results for input(s): COLORURINE, LABSPEC, [place], GLUCOSEU, HGBUR, BILIRUBINUR, KETONESUR, PROTEINUR, UROBILINOGEN, NITRITE, LEUKOCYTESUR in the last 72 hours.  Invalid input(s): APPERANCEUR     Component Value Date/Time   CHOL  161 04/05/2015 0819   CHOL 176 02/17/2015 0810   TRIG 104 04/05/2015 0819   HDL 43 04/05/2015 0819   HDL 70 02/17/2015 0810   CHOLHDL 3.7 04/05/2015 0819   CHOLHDL 2.5 02/17/2015 0810   VLDL 21 04/05/2015 0819   LDLCALC 97 04/05/2015 0819   LDLCALC 92 02/17/2015 0810   Lab Results  Component Value Date   HGBA1C 5.9* 04/05/2015   No results found for: LABOPIA, COCAINSCRNUR, LABBENZ, AMPHETMU, THCU, LABBARB  No results for input(s): ETH in the last 168 hours.  I have personally reviewed the radiological images below and agree with the radiology interpretations.  Dg Chest 2 View  04/04/2015    IMPRESSION: Stable mild cardiac enlargement.  No active disease.     Ct Head Wo Contrast  04/08/2015   IMPRESSION: 1. Unchanged right basal ganglia hematoma with intraventricular extension and small subarachnoid blood. 2. Stable mild ventriculomegaly.    04/07/2015   IMPRESSION: Evolving RIGHT basal ganglia hematoma with intraventricular extension. Mild RIGHT ventricular entrapment.  Increasing small to moderate amount of subarachnoid blood products are likely re- distributed.     04/05/2015   IMPRESSION: Size stable ~ 4 cm right basal ganglia hematoma. Intraventricular hemorrhage has redistributed, accounting for newly seen trace subarachnoid blood. Obstructive right lateral ventriculomegaly which is stable.  04/04/2015   IMPRESSION: Large acute hemorrhage centered in the region of the right caudate nucleus with rupture into the lateral ventricle and evidence of developing hydrocephalus and trapping of the right lateral ventricle. There is a mild amount of midline shift to the left. Parenchymal hemorrhage measures roughly 3.2 x 4.6 x 3.0 cm. This is most likely a hypertensive parenchymal bleed.    2D Echocardiogram  - Left ventricle: Systolic function was normal. The estimated ejection fraction was in the range of 55% to 60%. - Aortic valve: There was mild regurgitation. - Mitral valve: There  was mild to moderate regurgitation directed centrally. - Left atrium: The atrium was moderately dilated. - Right atrium: The atrium was moderately dilated. - Pulmonic valve: There was moderate regurgitation directed centrally. - Pulmonary arteries: Systolic pressure was mildly increased. PA peak pressure: 43 mm Hg (S).  EKG  atrial fibrillation, rate 82. For complete results please see formal report.  PHYSICAL EXAM  Temp:  [97.7 F (36.5 C)-99.2 F (37.3 C)] 99 F (37.2 C) (09/17 1200) Pulse Rate:  [25-129] 87 (09/17 1400) Resp:  [14-29] 22 (09/17 1400) BP: (112-175)/(62-99) 129/72 mmHg (09/17 1400) SpO2:  [94 %-100 %] 96 % (09/17 1400)  General - Well nourished, well developed, more awake today.  Ophthalmologic - fundi not visualized due to noncooperation.  Cardiovascular - irregularly irregular heart rate and rhythm, tachycardia.  Mental Status -  Level of arousal and orientation to place and people was intact, but not orientated to time or age. Language exam showed paucity of speech, but able to name and repeat and following simple commands.  Cranial Nerves II - XII - II - Visual field intact OU. III, IV, VI - Extraocular movements intact. V - Facial sensation intact bilaterally. VII - right nasolabial fold mild flattening. VIII - Hearing & vestibular intact bilaterally. X - Palate elevates symmetrically. XI - Chin turning & shoulder shrug intact bilaterally. XII - Tongue protrusion intact.  Motor Strength - The patient's strength was 4/5 in all extremities and pronator drift was absent.  Bulk was normal and fasciculations were absent.   Motor Tone - Muscle tone was assessed at the neck and appendages and was normal.  Reflexes - The patient's reflexes were symmetrical in all extremities and she had no pathological reflexes.  Sensory - Light touch, temperature/pinprick were assessed and were symmetrical.    Coordination - no significant ataxia bilaterally.   Tremor was absent.  Gait and Station - deferred due to safety concerns.   ASSESSMENT/PLAN [name] is a 81 y.o. female with history of HTN, DM, CHF, HLD, afib on coumadin, and CKD admitted for right caudate head ICH with IVH. Symptoms stable.    ICH:  Right caudate head with ventricular extension likely secondary to HTN  CT repeat x 3 showed stable hematoma and obstructive right lateral ventriculomegaly   Drowsiness likely due to cerebral edema  2D Echo  EF 55-60%  LDL 97  HgbA1c 5.9  Heparin subcutaneous for VTE prophylaxis  Did not pass swallow due to drowsiness, NPO except meds for now. Hold off NG tube for now.   warfarin prior to admission, now on no antithrombotic  Ongoing aggressive stroke risk factor management  Therapy recommendations:  pending  Disposition:  Pending  Obstructive hydrocephalus  Right mild ventriculomagely  No indication for intervention now  Repeat CT head stable hematoma and hydrocephalus  Afib on coumadin at home  Still has afib rhythm on tele  On cardizam and  metoprolol at home  Hold off coumadin  Rate controlled  Disturbed sleep wake cycle  Pt did not sleep much at night  Drowsy sleep during the day  Did not pass swallow today  NPO except meds   Hold off NG tube for now  Close monitoring  Sleep hygiene (turn on light, TV and perform activities during the day, turn off light, TV and quiet time at night)  Diabetes  HgbA1c 5.9 goal < 7.0  Controlled  CBG monitoring  SSI  Hypertension, accelerated  Home meds:   cardizem and metoprolol BP goal < 160. Resume po meds Taper off cleviprix if able  Hyperlipidemia  Home meds:  zocor 40   Currently on none due to [place]  LDL 97, goal < 70  Resume statin at discharge  CKD, stage III  Monitor Cre 1.32 -> 1.19  Avoid nephrotoxic agent  Continue IVF for now  Other Stroke Risk Factors  Advanced age  Other Active Problems  Mild  anemia  hypokalemia  Other Pertinent History    Hospital day # 5  This patient is critically ill due to [place] with IVH and hydrocephalus and afib not on AC and at significant risk of neurological worsening, death form cerebral edema, recurrent ICH, obstructive hydrocephalus, brain herniation, ischemic stroke due to afib not on AC. This patient's care requires constant monitoring of vital signs, hemodynamics, respiratory and cardiac monitoring, review of multiple databases, neurological assessment, discussion with family, other specialists and medical decision making of high complexity. I spent 40 minutes of neurocritical care time in the care of this patient.   Rosalin Hawking, MD PhD Stroke Neurology 04/09/2015 4:04 PM    To contact Stroke Continuity provider, please refer to [email or web address] After hours, contact General Neurology

## 2015-04-09 NOTE — Evaluation (Signed)
Clinical/Bedside Swallow Evaluation Patient Details  Name: Nicole [name] MRN: [id number] Date of Birth: 06-25-1931  Today's Date: 04/09/2015 Time: SLP Start Time (ACUTE ONLY): 1045 SLP Stop Time (ACUTE ONLY): 1058 SLP Time Calculation (min) (ACUTE ONLY): 13 min  Past Medical History:  Past Medical History  Diagnosis Date  . Colon cancer 07/1997  . Hypertension   . Diabetes mellitus   . CHF (congestive heart failure)   . AAA (abdominal aortic aneurysm)   . Atrial fibrillation   . Hypercholesteremia   . Kidney stone     renal calculi  . Small bowel obstruction due to adhesions 05/16/2012  . Hypothyroidism   . Arthritis   . Anemia   . Cholelithiasis   . Perforation of bile duct   . Hemorrhage of rectum and anus   . Swelling, mass, or lump in head and neck   . Cervicalgia   . Hypoglycemia, unspecified   . Pain in joint, lower leg   . Anxiety   . Shortness of breath   . Acute upper respiratory infections of unspecified site   . Depression   . Thoracic aneurysm without mention of rupture   . GERD (gastroesophageal reflux disease)   . Proteinuria   . Other specified cardiac dysrhythmias(427.89)   . Congestive heart failure, unspecified   . Electrolyte and fluid disorders not elsewhere classified   . Other chronic allergic conjunctivitis   . Hypopotassemia   . Chronic systolic heart failure   . Dizziness and giddiness   . Malignant neoplasm of colon, unspecified site   . Other and unspecified hyperlipidemia   . Gout, unspecified   . Atrial fibrillation   . Aortic aneurysm of unspecified site without mention of rupture   . Pain in joint, site unspecified   . Palpitations   . CKD (chronic kidney disease)   . Diabetes mellitus    Past Surgical History:  Past Surgical History  Procedure Laterality Date  . Hemicolectomy  08/11/1997  . Kidney stone surgery    . Colon surgery      to remove colon ca  . Cholecystectomy  05/14/2012    Procedure: LAPAROSCOPIC  CHOLECYSTECTOMY WITH INTRAOPERATIVE CHOLANGIOGRAM;  Surgeon: Haywood Lasso, MD;  Location: [facility];  Service: General;  Laterality: N/A;  . Ercp  05/17/2012    Procedure: ENDOSCOPIC RETROGRADE CHOLANGIOPANCREATOGRAPHY (ERCP);  Surgeon: Missy Sabins, MD;  Location: [place];  Service: Gastroenterology;  Laterality: N/A;  . Ercp  08/12/2012    Procedure: ENDOSCOPIC RETROGRADE CHOLANGIOPANCREATOGRAPHY (ERCP);  Surgeon: Missy Sabins, MD;  [facility] END[facility]COPY;  Service: Endoscopy;  Laterality: N/A;  . Transthoracic echocardiogram  11/2009    EF 45-50%, mild-mod AV regurg; mild MR; mod TR; ascending aorta mildly dilated  . Cardiac catheterization  2008    moderate severe pulm HTN; with elevted pulm capillary wedge pressure    HPI:  Nicole [name] is an 81 y.o. female history of atrial fibrillation on Coumadin, hypertension, hyperlipidemia, diabetes mellitus, CHF and chronic kidney disease, brought to the emergency room after being found on the floor home. CT scan of her head showed fairly large intracerebral hemorrhage involving the right caudate and extending into right ventricle with mass effect with mild midline shift as well as right ventricular enlargement. evaluated by Dr. Saintclair Halsted of the Neurosurgery Service. Ventricular drain is not felt to be warranted at this time.   Assessment / Plan / Recommendation Clinical Impression  Pt's level of alertness is not sufficient for safe PO  intake at this time. She requires constant stimulation for arousal, with decreased ability to sustain attention to oral manipulation of boluses. As a result, she has oral holding with liquids and purees. Purees are eventually suctioned from her oral cavity with no pharyngeal swallow triggered, while thin liquids likely lead to premature spillage and airway penetration with thin liquids as evidenced by immediate coughing. RN did report good toelrance of meds crushed in puree when pt was more alert this morning. Recommend to  remain NPO except for meds crushed in puree when alert. SLP to f/u for PO readiness as alertness improves.    Aspiration Risk  Severe    Diet Recommendation NPO except meds   Medication Administration: Crushed with puree    Other  Recommendations Oral Care Recommendations: Oral care QID   Follow Up Recommendations       Frequency and Duration min 3x week  1 week   Pertinent Vitals/Pain n/a    SLP Swallow Goals     Swallow Study Prior Functional Status       General Other Pertinent Information: Nicole [name] is an 81 y.o. female history of atrial fibrillation on Coumadin, hypertension, hyperlipidemia, diabetes mellitus, CHF and chronic kidney disease, brought to the emergency room after being found on the floor home. CT scan of her head showed fairly large intracerebral hemorrhage involving the right caudate and extending into right ventricle with mass effect with mild midline shift as well as right ventricular enlargement. evaluated by Dr. Saintclair Halsted of the Neurosurgery Service. Ventricular drain is not felt to be warranted at this time. Type of Study: Bedside swallow evaluation Previous Swallow Assessment: none inchart Diet Prior to this Study: NPO Temperature Spikes Noted: Yes (100.1) Respiratory Status: Room air History of Recent Intubation: No Behavior/Cognition: Lethargic/Drowsy Self-Feeding Abilities: Needs assist Patient Positioning: Upright in bed Baseline Vocal Quality: Low vocal intensity    Oral/Motor/Sensory Function     Ice Chips Ice chips: Not tested   Thin Liquid Thin Liquid: Impaired Presentation: Cup;Self Fed;Straw Oral Phase Impairments: Poor awareness of bolus Oral Phase Functional Implications: Oral holding Pharyngeal  Phase Impairments: Cough - Immediate    Nectar Thick Nectar Thick Liquid: Not tested   Honey Thick Honey Thick Liquid: Not tested   Puree Puree: Impaired Presentation: Spoon Oral Phase Impairments: Poor awareness of bolus Oral Phase  Functional Implications: Oral holding   Solid   Solid: Not tested      Germain Osgood, M.A. CCC-SLP [phone]  Germain Osgood 04/09/2015,1:27 PM

## 2015-04-10 ENCOUNTER — Inpatient Hospital Stay (HOSPITAL_COMMUNITY): Payer: Medicare Other

## 2015-04-10 DIAGNOSIS — D699 Hemorrhagic condition, unspecified: Secondary | ICD-10-CM

## 2015-04-10 DIAGNOSIS — T45515A Adverse effect of anticoagulants, initial encounter: Secondary | ICD-10-CM

## 2015-04-10 LAB — CBC
HCT: 37.5 % (ref 36.0–46.0)
HEMOGLOBIN: 13.2 g/dL (ref 12.0–15.0)
MCH: 27.3 pg (ref 26.0–34.0)
MCHC: 35.2 g/dL (ref 30.0–36.0)
MCV: 77.5 fL — ABNORMAL LOW (ref 78.0–100.0)
PLATELETS: 282 10*3/uL (ref 150–400)
RBC: 4.84 MIL/uL (ref 3.87–5.11)
RDW: 17.5 % — ABNORMAL HIGH (ref 11.5–15.5)
WBC: 15.2 10*3/uL — ABNORMAL HIGH (ref 4.0–10.5)

## 2015-04-10 LAB — GLUCOSE, CAPILLARY
GLUCOSE-CAPILLARY: 135 mg/dL — AB (ref 65–99)
GLUCOSE-CAPILLARY: 153 mg/dL — AB (ref 65–99)
Glucose-Capillary: 96 mg/dL (ref 65–99)
Glucose-Capillary: 132 mg/dL — ABNORMAL HIGH (ref 65–99)

## 2015-04-10 LAB — BASIC METABOLIC PANEL
Anion gap: 12 (ref 5–15)
BUN: 20 mg/dL (ref 6–20)
CO2: 21 mmol/L — ABNORMAL LOW (ref 22–32)
CREATININE: 1.16 mg/dL — AB (ref 0.44–1.00)
Calcium: 9.5 mg/dL (ref 8.9–10.3)
Chloride: 106 mmol/L (ref 101–111)
GFR, EST AFRICAN AMERICAN: 49 mL/min — AB (ref >60)
GFR, EST NON AFRICAN AMERICAN: 43 mL/min — AB (ref >60)
Glucose, Bld: 134 mg/dL — ABNORMAL HIGH (ref 65–99)
Potassium: 4 mmol/L (ref 3.5–5.1)
SODIUM: 139 mmol/L (ref 135–145)

## 2015-04-10 MED ORDER — PIVOT 1.5 CAL PO LIQD
1000.0000 mL | ORAL | Status: DC
  Administered 2015-04-10: 1000 mL
  Filled 2015-04-10 (×3): qty 1000

## 2015-04-10 NOTE — Progress Notes (Signed)
Speech Language Pathology Treatment: Dysphagia  Patient Details Name: Nicole [name] MRN: [id number] DOB: 05/17/1931 Today's Date: 04/10/2015 Time: 6213-0865 SLP Time Calculation (min) (ACUTE ONLY): 28 min  Assessment / Plan / Recommendation Clinical Impression  Pt alert, sitting up in bed. Oral care and cleansing of dentures prior to various consistencies provided with hand over hand assist (dominant left hand) resulting in immediate throat clears and delayed coughs/throat clears throughout session. Delayed oral transit/holding up to 30 seconds stating "I'm trying to get it ready." Objective measure of oral and pharyngeal phases warranted. Continue small bites crushed meds with applesauce using swallow precautions. MBS recommended.   HPI Other Pertinent Information: Nicole [name] is an 81 y.o. female history of atrial fibrillation on Coumadin, hypertension, hyperlipidemia, diabetes mellitus, CHF and chronic kidney disease, brought to the emergency room after being found on the floor home. CT scan of her head showed fairly large intracerebral hemorrhage involving the right caudate and extending into right ventricle with mass effect with mild midline shift as well as right ventricular enlargement. evaluated by Dr. Saintclair Halsted of the Neurosurgery Service. Ventricular drain is not felt to be warranted at this time.   Pertinent Vitals Pain Assessment: No/denies pain  SLP Plan  Continue with current plan of care    Recommendations Diet recommendations:  (NPO except meds) Medication Administration: Crushed with puree              General recommendations:  (TBD) Oral Care Recommendations: Oral care QID Follow up Recommendations:  (TBD) Plan: Continue with current plan of care    GO     Houston Siren 04/10/2015, 11:43 AM  Orbie Pyo Colvin Caroli.[employer] [phone]

## 2015-04-10 NOTE — Progress Notes (Signed)
STROKE TEAM PROGRESS NOTE   SUBJECTIVE (INTERVAL HISTORY) Husband is at the bedside. She is more awake today, blood pressure is still high, on cleviprex. Repeat CT 04/08/15 stable. Still did not pass swallow yesterday but NPO except meds. Will repeat STeval today. Put on IVF.     OBJECTIVE Temp:  [98.1 F (36.7 C)-100.7 F (38.2 C)] 100 F (37.8 C) (09/18 0808) Pulse Rate:  [63-123] 83 (09/18 0930) Cardiac Rhythm:  [-] Atrial fibrillation (09/18 0800) Resp:  [16-37] 23 (09/18 0930) BP: (97-162)/(62-113) 127/76 mmHg (09/18 0930) SpO2:  [93 %-99 %] 98 % (09/18 0930)   Recent Labs Lab 04/09/15 0754 04/09/15 1131 04/09/15 1609 04/09/15 2203 04/10/15 0806  GLUCAP 161* 175* 121* 148* 135*    Recent Labs Lab 04/06/15 0217 04/07/15 0250 04/08/15 0359 04/09/15 0229 04/10/15 0237  NA 143 140 138 136 139  K 3.2* 3.2* 4.3 3.3* 4.0  CL 111 106 109 103 106  CO2 24 23 17* 20* 21*  GLUCOSE 130* 148* 138* 143* 134*  BUN 11 9 10 13 20   CREATININE 1.32* 1.19* 1.11* 0.97 1.16*  CALCIUM 8.9 8.9 9.0 9.0 9.5    Recent Labs Lab 04/04/15 0945  AST 22  ALT 10*  ALKPHOS 91  BILITOT 1.0  PROT 7.8  ALBUMIN 3.6    Recent Labs Lab 04/04/15 0945  04/06/15 0217 04/07/15 0250 04/08/15 0359 04/09/15 0229 04/10/15 0237  WBC 7.1  < > 6.9 9.4 9.9 10.8* 15.2*  NEUTROABS 6.1  --   --   --   --   --   --   HGB 12.8  < > 11.6* 11.8* 13.1 12.3 13.2  HCT 37.4  < > 34.0* 35.2* 36.5 35.7* 37.5  MCV 77.1*  < > 76.6* 76.7* 75.7* 76.6* 77.5*  PLT 329  < > 270 268 PLATELET CLUMPS NOTED ON SMEAR, UNABLE TO ESTIMATE 276 282  < > = values in this interval not displayed. No results for input(s): CKTOTAL, CKMB, CKMBINDEX, TROPONINI in the last 168 hours. No results for input(s): LABPROT, INR in the last 72 hours. No results for input(s): COLORURINE, LABSPEC, [place], GLUCOSEU, HGBUR, BILIRUBINUR, KETONESUR, PROTEINUR, UROBILINOGEN, NITRITE, LEUKOCYTESUR in the last 72 hours.  Invalid input(s):  APPERANCEUR     Component Value Date/Time   CHOL 161 04/05/2015 0819   CHOL 176 02/17/2015 0810   TRIG 104 04/05/2015 0819   HDL 43 04/05/2015 0819   HDL 70 02/17/2015 0810   CHOLHDL 3.7 04/05/2015 0819   CHOLHDL 2.5 02/17/2015 0810   VLDL 21 04/05/2015 0819   LDLCALC 97 04/05/2015 0819   LDLCALC 92 02/17/2015 0810   Lab Results  Component Value Date   HGBA1C 5.9* 04/05/2015   No results found for: LABOPIA, COCAINSCRNUR, LABBENZ, AMPHETMU, THCU, LABBARB  No results for input(s): ETH in the last 168 hours.  I have personally reviewed the radiological images below and agree with the radiology interpretations.  Dg Chest 2 View  04/04/2015    IMPRESSION: Stable mild cardiac enlargement.  No active disease.     Ct Head Wo Contrast  04/08/2015   IMPRESSION: 1. Unchanged right basal ganglia hematoma with intraventricular extension and small subarachnoid blood. 2. Stable mild ventriculomegaly.    04/07/2015   IMPRESSION: Evolving RIGHT basal ganglia hematoma with intraventricular extension. Mild RIGHT ventricular entrapment.  Increasing small to moderate amount of subarachnoid blood products are likely re- distributed.     04/05/2015   IMPRESSION: Size stable ~ 4 cm right basal ganglia  hematoma. Intraventricular hemorrhage has redistributed, accounting for newly seen trace subarachnoid blood. Obstructive right lateral ventriculomegaly which is stable.    04/04/2015   IMPRESSION: Large acute hemorrhage centered in the region of the right caudate nucleus with rupture into the lateral ventricle and evidence of developing hydrocephalus and trapping of the right lateral ventricle. There is a mild amount of midline shift to the left. Parenchymal hemorrhage measures roughly 3.2 x 4.6 x 3.0 cm. This is most likely a hypertensive parenchymal bleed.    2D Echocardiogram  - Left ventricle: Systolic function was normal. The estimated ejection fraction was in the range of 55% to 60%. - Aortic valve:  There was mild regurgitation. - Mitral valve: There was mild to moderate regurgitation directed centrally. - Left atrium: The atrium was moderately dilated. - Right atrium: The atrium was moderately dilated. - Pulmonic valve: There was moderate regurgitation directed centrally. - Pulmonary arteries: Systolic pressure was mildly increased. PA peak pressure: 43 mm Hg (S).  EKG  atrial fibrillation, rate 82. For complete results please see formal report.  PHYSICAL EXAM  Temp:  [98.1 F (36.7 C)-100.7 F (38.2 C)] 100 F (37.8 C) (09/18 0808) Pulse Rate:  [63-123] 83 (09/18 0930) Resp:  [16-37] 23 (09/18 0930) BP: (97-162)/(62-113) 127/76 mmHg (09/18 0930) SpO2:  [93 %-99 %] 98 % (09/18 0930)  General - Well nourished, well developed elderly african american aldy not in distress.,    Ophthalmologic - fundi not visualized due to noncooperation.  Cardiovascular - irregularly irregular heart rate and rhythm, tachycardia.  Mental Status -  Level of arousal and orientation to place and people was intact, but not orientated to time or age. Language exam showed paucity of speech, but able to name and repeat and following simple commands.  Cranial Nerves II - XII - II - Visual field intact OU. III, IV, VI - Extraocular movements intact. V - Facial sensation intact bilaterally. VII - right nasolabial fold mild flattening. VIII - Hearing & vestibular intact bilaterally. X - Palate elevates symmetrically. XI - Chin turning & shoulder shrug intact bilaterally. XII - Tongue protrusion intact.  Motor Strength - The patient's strength was 4/5 in all extremities and pronator drift was absent.  Bulk was normal and fasciculations were absent.   Motor Tone - Muscle tone was assessed at the neck and appendages and was normal.  Reflexes - The patient's reflexes were symmetrical in all extremities and she had no pathological reflexes.  Sensory - Light touch, temperature/pinprick were  assessed and were symmetrical.    Coordination - no significant ataxia bilaterally.  Tremor was absent.  Gait and Station - deferred due to safety concerns.   ASSESSMENT/PLAN Ms. JEANNIA TATRO is a 81 y.o. female with history of HTN, DM, CHF, HLD, afib on coumadin, and CKD admitted for right caudate head ICH with IVH. Symptoms stable.    ICH:  Right caudate head with ventricular extension likely secondary to HTN  And warfarin coagulopathy ( INR 3.6 on admission)  CT repeat x 3 showed stable hematoma and obstructive right lateral ventriculomegaly   Drowsiness likely due to cerebral edema  2D Echo  EF 55-60%  LDL 97  HgbA1c 5.9  Heparin subcutaneous for VTE prophylaxis  Did not pass swallow due to drowsiness, NPO except meds for now. Hold off NG tube for now.   warfarin prior to admission, now on no antithrombotic  Ongoing aggressive stroke risk factor management  Therapy recommendations:  pending  Disposition:  Pending  Obstructive hydrocephalus  Right mild ventriculomagely  No indication for intervention now  Repeat CT head stable hematoma and hydrocephalus  Afib on coumadin at home  Still has afib rhythm on tele  On cardizam and metoprolol at home  Hold off coumadin  Rate controlled  Disturbed sleep wake cycle  Pt did not sleep much at night  Drowsy sleep during the day  Did not pass swallow today  NPO except meds   Hold off NG tube for now  Close monitoring  Sleep hygiene (turn on light, TV and perform activities during the day, turn off light, TV and quiet time at night)  Diabetes  HgbA1c 5.9 goal < 7.0  Controlled  CBG monitoring  SSI  Hypertension, accelerated  Home meds:   cardizem and metoprolol BP goal < 160. Resume po meds Taper off cleviprix if able  Hyperlipidemia  Home meds:  zocor 40   Currently on none due to [place]  LDL 97, goal < 70  Resume statin at discharge  CKD, stage III  Monitor Cre 1.32 ->  1.19  Avoid nephrotoxic agent  Continue IVF for now  Other Stroke Risk Factors  Advanced age  Other Active Problems  Mild anemia  hypokalemia  Other Pertinent History    Hospital day # 6 Plan to taper and Dc cleviprex. Mobilize out of bed. Transfer to floor bed when off cleviprex tomorrow.D/W patient and husband and answered questions This patient is critically ill due to [place] with IVH and hydrocephalus and afib not on AC and at significant risk of neurological worsening, death form cerebral edema, recurrent ICH, obstructive hydrocephalus, brain herniation, ischemic stroke due to afib not on AC. This patient's care requires constant monitoring of vital signs, hemodynamics, respiratory and cardiac monitoring, review of multiple databases, neurological assessment, discussion with family, other specialists and medical decision making of high complexity. I spent 30 minutes of neurocritical care time in the care of this patient.   Antony Contras, MD Stroke Neurology 04/10/2015 10:38 AM    To contact Stroke Continuity provider, please refer to [email or web address] After hours, contact General Neurology

## 2015-04-11 ENCOUNTER — Inpatient Hospital Stay (HOSPITAL_COMMUNITY): Payer: Medicare Other

## 2015-04-11 LAB — GLUCOSE, CAPILLARY
GLUCOSE-CAPILLARY: 167 mg/dL — AB (ref 65–99)
Glucose-Capillary: 148 mg/dL — ABNORMAL HIGH (ref 65–99)
Glucose-Capillary: 122 mg/dL — ABNORMAL HIGH (ref 65–99)
Glucose-Capillary: 122 mg/dL — ABNORMAL HIGH (ref 65–99)

## 2015-04-11 MED ORDER — BISACODYL 10 MG RE SUPP
10.0000 mg | Freq: Every day | RECTAL | Status: DC
Start: 2015-04-11 — End: 2015-04-12
  Administered 2015-04-11 – 2015-04-12 (×2): 10 mg via RECTAL
  Filled 2015-04-11 (×2): qty 1

## 2015-04-11 MED ORDER — WHITE PETROLATUM GEL
Status: AC
  Administered 2015-04-11: 1
  Filled 2015-04-11: qty 1

## 2015-04-11 MED ORDER — ENSURE ENLIVE PO LIQD
237.0000 mL | Freq: Two times a day (BID) | ORAL | Status: DC
  Administered 2015-04-12: 237 mL via ORAL
  Filled 2015-04-11 (×4): qty 237

## 2015-04-11 NOTE — Progress Notes (Signed)
I met with pt, two sons, and friend, [name], at bedside. We discussed an inpt rehab admission and they are in agreement. I have insurance approval and await bed availability over the next 24 - 48 hrs if MD feels pt ready for d/c to CIR. I will follow up tomorrow. [phone]

## 2015-04-11 NOTE — Progress Notes (Signed)
Pt admitted to 5M01 from 86M at this time.  Family at bedside.  Pt very lethargic at present but 88M Rn reported she tends to become sleepy in the evening and MD aware.  She is responding to stimuli, opening eyes. Tele placed on patient and CCMD called.  Bed alarm set and family aware to call for help if patient attempts to get OOB.  Per family member, pt has eye glasses but they are at home, and has upper and lower dentures which are on patient.  Will assess for any changes and needs.

## 2015-04-11 NOTE — Progress Notes (Signed)
RN walked into room and pt's panda tube was pulled completely out and lying in pt's lap.

## 2015-04-11 NOTE — Progress Notes (Signed)
Initial Nutrition Assessment  INTERVENTION:   Ensure Enlive po BID, each supplement provides 350 kcal and 20 grams of protein  NUTRITION DIAGNOSIS:   Inadequate oral intake related to dysphagia as evidenced by meal completion < 50%.  GOAL:   Patient will meet greater than or equal to 90% of their needs  MONITOR:   I & O's, Labs, Supplement acceptance, PO intake  REASON FOR ASSESSMENT:   Consult Enteral/tube feeding initiation and management  ASSESSMENT:   Pt with history of HTN, DM, CHF, HLD, afib on coumadin, and CKD admitted for right caudate head ICH with IVH.   Medications reviewed and include: dulcolax, senokot-s CBG's: 96-153 Pt pulled feeding tube out, had a MBS and was cleared for a diet.  Unable to see pt at this time.  Unable to complete Nutrition-Focused physical exam at this time.   Diet Order:  DIET DYS 3 Room service appropriate?: Yes; Fluid consistency:: Thin  Skin:  Reviewed, no issues  Last BM:  9/12  Height:   Ht Readings from Last 1 Encounters:  04/08/15 5\' 8"  (1.727 m)    Weight:   Wt Readings from Last 1 Encounters:  04/08/15 177 lb 14.6 oz (80.7 kg)    Ideal Body Weight:  63.6 kg  BMI:  Body mass index is 27.06 kg/(m^2).  Estimated Nutritional Needs:   Kcal:  1600-1800  Protein:  80-95 grams  Fluid:  > 1.6 L/day  EDUCATION NEEDS:   No education needs identified at this time  [place], [employer], [place] Pager [phone] After Hours Pager

## 2015-04-11 NOTE — Care Management Important Message (Signed)
Important Message  Patient Details  Name: Nicole [name] MRN: [id number] Date of Birth: Oct 26, 1930   Medicare Important Message Given:  Yes-third notification given    Nathen May 04/11/2015, 2:51 PM

## 2015-04-11 NOTE — Progress Notes (Signed)
Physical Therapy Treatment Patient Details Name: Nicole [name] MRN: [id number] DOB: [birth year] Today's Date: 04/11/2015    History of Present Illness Nicole [name] is an 81 y.o. female history of atrial fibrillation on Coumadin, hypertension, hyperlipidemia, diabetes mellitus, CHF and chronic kidney disease, brought to the emergency room after being found on the floor home. CT scan of her head showed fairly large intracerebral hemorrhage involving the right caudate and extending into right ventricle with mass effect with mild midline shift as well as right ventricular enlargement. evaluated by Dr. Saintclair Halsted of the Neurosurgery Service. Ventricular drain is not felt to be warranted at this time.    PT Comments    Patient requiring more assist this session as compared to eval.  Feel limited initiation, decreased safety and deficit awareness will limit safety and independence.  Agree with CIR for d/c disposition if family can provide 24 hour assist upon d/c.  Follow Up Recommendations  CIR;Supervision/Assistance - 24 hour     Equipment Recommendations  Rolling walker with 5" wheels    Recommendations for Other Services Rehab consult     Precautions / Restrictions Precautions Precautions: Fall    Mobility  Bed Mobility Overal bed mobility: Needs Assistance       Supine to sit: HOB elevated;Mod assist     General bed mobility comments: assist to move left leg to edge of bed and to bring left hip to edge of bed; assist for balance as well leaning post to right  Transfers Overall transfer level: Needs assistance Equipment used: Rolling walker (2 wheeled) Transfers: Sit to/from Stand Sit to Stand: Max assist;From elevated surface         General transfer comment: increased time with elevated height of bed and increased assist due to poor initiation and poor anterior weight shift, did not reposition hand to edge of bed and pulled up on walker despite manually placing right hand  on bed  Ambulation/Gait Ambulation/Gait assistance: Mod assist Ambulation Distance (Feet): 90 Feet Assistive device: Rolling walker (2 wheeled) Gait Pattern/deviations: Step-to pattern;Step-through pattern;Decreased dorsiflexion - left;Decreased stride length Gait velocity: took approximately 8-10 minutes to walk above distance   General Gait Details: manual assist for left pelvic protracion, multiple stops to reposition posture for anterior pelvic tilt, left pelvic protraction, head and chest up with cues and facilitation.  Dragging left foot on floor throughout despite cues to pick up leg   Stairs            Wheelchair Mobility    Modified Rankin (Stroke Patients Only) Modified Rankin (Stroke Patients Only) Pre-Morbid Rankin Score: No symptoms Modified Rankin: Moderately severe disability     Balance Overall balance assessment: Needs assistance   Sitting balance-Leahy Scale: Poor Sitting balance - Comments: minguard at least for balance at edge of bed   Standing balance support: Bilateral upper extremity supported Standing balance-Leahy Scale: Poor Standing balance comment: needs UE support but can balance with supervision momentarily                    Cognition Arousal/Alertness: Awake/alert Behavior During Therapy: Anxious Overall Cognitive Status: Impaired/Different from baseline Area of Impairment: Attention;Memory;Problem solving;Following commands;Safety/judgement Orientation Level: Disoriented to;Time Current Attention Level: Sustained Memory: Decreased short-term memory Following Commands: Follows one step commands with increased time Safety/Judgement: Decreased awareness of deficits Awareness: Intellectual Problem Solving: Decreased initiation;Slow processing      Exercises      General Comments        Pertinent Vitals/Pain Pain Assessment:  No/denies pain    Home Living                      Prior Function            PT  Goals (current goals can now be found in the care plan section) Progress towards PT goals: Progressing toward goals    Frequency  Min 4X/week    PT Plan Current plan remains appropriate;Frequency needs to be updated    Co-evaluation             End of Session Equipment Utilized During Treatment: Gait belt Activity Tolerance: Patient tolerated treatment well Patient left: in chair;with call bell/phone within reach;with chair alarm set     Time: [id number] PT Time Calculation (min) (ACUTE ONLY): 36 min  Charges:  $Gait Training: 8-22 mins $Neuromuscular Re-education: 8-22 mins                    G Codes:      WYNN,CYNDI 25-Apr-2015, 11:07 AM  Magda Kiel, [place] 25-Apr-2015

## 2015-04-11 NOTE — Progress Notes (Signed)
STROKE TEAM PROGRESS NOTE   SUBJECTIVE (INTERVAL HISTORY) Husband is at the bedside. She is more awake today, blood pressure is better. She is off cleviprex. Repeat CT 04/08/15 stable. She did  pass swallow yesterday     OBJECTIVE Temp:  [97.6 F (36.4 C)-99.9 F (37.7 C)] 97.6 F (36.4 C) (09/19 1138) Pulse Rate:  [42-134] 86 (09/19 1200) Cardiac Rhythm:  [-] Atrial fibrillation (09/19 0800) Resp:  [14-32] 23 (09/19 1200) BP: (138-183)/(74-126) 165/81 mmHg (09/19 1200) SpO2:  [93 %-100 %] 100 % (09/19 1200)   Recent Labs Lab 04/10/15 1128 04/10/15 1756 04/10/15 2149 04/11/15 0803 04/11/15 1136  GLUCAP 153* 96 132* 148* 167*    Recent Labs Lab 04/06/15 0217 04/07/15 0250 04/08/15 0359 04/09/15 0229 04/10/15 0237  NA 143 140 138 136 139  K 3.2* 3.2* 4.3 3.3* 4.0  CL 111 106 109 103 106  CO2 24 23 17* 20* 21*  GLUCOSE 130* 148* 138* 143* 134*  BUN 11 9 10 13 20   CREATININE 1.32* 1.19* 1.11* 0.97 1.16*  CALCIUM 8.9 8.9 9.0 9.0 9.5   No results for input(s): AST, ALT, ALKPHOS, BILITOT, PROT, ALBUMIN in the last 168 hours.  Recent Labs Lab 04/06/15 0217 04/07/15 0250 04/08/15 0359 04/09/15 0229 04/10/15 0237  WBC 6.9 9.4 9.9 10.8* 15.2*  HGB 11.6* 11.8* 13.1 12.3 13.2  HCT 34.0* 35.2* 36.5 35.7* 37.5  MCV 76.6* 76.7* 75.7* 76.6* 77.5*  PLT 270 268 PLATELET CLUMPS NOTED ON SMEAR, UNABLE TO ESTIMATE 276 282   No results for input(s): CKTOTAL, CKMB, CKMBINDEX, TROPONINI in the last 168 hours. No results for input(s): LABPROT, INR in the last 72 hours. No results for input(s): COLORURINE, LABSPEC, [place], GLUCOSEU, HGBUR, BILIRUBINUR, KETONESUR, PROTEINUR, UROBILINOGEN, NITRITE, LEUKOCYTESUR in the last 72 hours.  Invalid input(s): APPERANCEUR     Component Value Date/Time   CHOL 161 04/05/2015 0819   CHOL 176 02/17/2015 0810   TRIG 104 04/05/2015 0819   HDL 43 04/05/2015 0819   HDL 70 02/17/2015 0810   CHOLHDL 3.7 04/05/2015 0819   CHOLHDL 2.5  02/17/2015 0810   VLDL 21 04/05/2015 0819   LDLCALC 97 04/05/2015 0819   LDLCALC 92 02/17/2015 0810   Lab Results  Component Value Date   HGBA1C 5.9* 04/05/2015   No results found for: LABOPIA, COCAINSCRNUR, LABBENZ, AMPHETMU, THCU, LABBARB  No results for input(s): ETH in the last 168 hours.  I have personally reviewed the radiological images below and agree with the radiology interpretations.  Dg Chest 2 View  04/04/2015    IMPRESSION: Stable mild cardiac enlargement.  No active disease.     Ct Head Wo Contrast  04/08/2015   IMPRESSION: 1. Unchanged right basal ganglia hematoma with intraventricular extension and small subarachnoid blood. 2. Stable mild ventriculomegaly.    04/07/2015   IMPRESSION: Evolving RIGHT basal ganglia hematoma with intraventricular extension. Mild RIGHT ventricular entrapment.  Increasing small to moderate amount of subarachnoid blood products are likely re- distributed.     04/05/2015   IMPRESSION: Size stable ~ 4 cm right basal ganglia hematoma. Intraventricular hemorrhage has redistributed, accounting for newly seen trace subarachnoid blood. Obstructive right lateral ventriculomegaly which is stable.    04/04/2015   IMPRESSION: Large acute hemorrhage centered in the region of the right caudate nucleus with rupture into the lateral ventricle and evidence of developing hydrocephalus and trapping of the right lateral ventricle. There is a mild amount of midline shift to the left. Parenchymal hemorrhage measures roughly 3.2 x  4.6 x 3.0 cm. This is most likely a hypertensive parenchymal bleed.    2D Echocardiogram  - Left ventricle: Systolic function was normal. The estimated ejection fraction was in the range of 55% to 60%. - Aortic valve: There was mild regurgitation. - Mitral valve: There was mild to moderate regurgitation directed centrally. - Left atrium: The atrium was moderately dilated. - Right atrium: The atrium was moderately dilated. - Pulmonic  valve: There was moderate regurgitation directed centrally. - Pulmonary arteries: Systolic pressure was mildly increased. PA peak pressure: 43 mm Hg (S).  EKG  atrial fibrillation, rate 82. For complete results please see formal report.  PHYSICAL EXAM  Temp:  [97.6 F (36.4 C)-99.9 F (37.7 C)] 97.6 F (36.4 C) (09/19 1138) Pulse Rate:  [42-134] 86 (09/19 1200) Resp:  [14-32] 23 (09/19 1200) BP: (138-183)/(74-126) 165/81 mmHg (09/19 1200) SpO2:  [93 %-100 %] 100 % (09/19 1200)  General - Well nourished, well developed elderly african american aldy not in distress.,    Ophthalmologic - fundi not visualized due to noncooperation.  Cardiovascular - irregularly irregular heart rate and rhythm, tachycardia.  Mental Status -  Level of arousal and orientation to place and people was intact, but not orientated to time or age. Language exam showed paucity of speech, but able to name and repeat and following simple commands.  Cranial Nerves II - XII - II - Visual field intact OU. III, IV, VI - Extraocular movements intact. V - Facial sensation intact bilaterally. VII - right nasolabial fold mild flattening. VIII - Hearing & vestibular intact bilaterally. X - Palate elevates symmetrically. XI - Chin turning & shoulder shrug intact bilaterally. XII - Tongue protrusion intact.  Motor Strength - The patient's strength was 4/5 in all extremities and pronator drift was absent.  Bulk was normal and fasciculations were absent.   Motor Tone - Muscle tone was assessed at the neck and appendages and was normal.  Reflexes - The patient's reflexes were symmetrical in all extremities and she had no pathological reflexes.  Sensory - Light touch, temperature/pinprick were assessed and were symmetrical.    Coordination - no significant ataxia bilaterally.  Tremor was absent.  Gait and Station - deferred due to safety concerns.   ASSESSMENT/PLAN Ms. DARLENA KOVAL is a 81 y.o. female  with history of HTN, DM, CHF, HLD, afib on coumadin, and CKD admitted for right caudate head ICH with IVH. Symptoms stable.    ICH:  Right caudate head with ventricular extension likely secondary to HTN  And warfarin coagulopathy ( INR 3.6 on admission)  CT repeat x 3 showed stable hematoma and obstructive right lateral ventriculomegaly   Drowsiness likely due to cerebral edema  2D Echo  EF 55-60%  LDL 97  HgbA1c 5.9  Heparin subcutaneous for VTE prophylaxis  Did  pass swallow  eval -dyspagia 3 diet with thin liquids   warfarin prior to admission, now on no antithrombotic due to [place]  Ongoing aggressive stroke risk factor management  Therapy recommendations:  pending  Disposition:  Pending  Obstructive hydrocephalus  Right mild ventriculomagely  No indication for intervention now  Repeat CT head stable hematoma and hydrocephalus  Afib on coumadin at home  Still has afib rhythm on tele  On cardizam and metoprolol at home  Hold off coumadin  Rate controlled  Disturbed sleep wake cycle  Pt did not sleep much at night  Drowsy sleep during the day  Did not pass swallow today  NPO except meds  Hold off NG tube for now  Close monitoring  Sleep hygiene (turn on light, TV and perform activities during the day, turn off light, TV and quiet time at night)  Diabetes  HgbA1c 5.9 goal < 7.0  Controlled  CBG monitoring  SSI  Hypertension, accelerated  Home meds:   cardizem and metoprolol BP goal < 160. Resume po meds Taper off cleviprix if able  Hyperlipidemia  Home meds:  zocor 40   Currently on none due to [place]  LDL 97, goal < 70  Resume statin at discharge  CKD, stage III  Monitor Cre 1.32 -> 1.19  Avoid nephrotoxic agent  Continue IVF for now  Other Stroke Risk Factors  Advanced age  Other Active Problems  Mild anemia  hypokalemia  Other Pertinent History    Hospital day # 7 Plan   Mobilize out of bed. Transfer to floor  bed today.D/W patient and husband and answered questions Likely inpatient rehab transfer over next few days.Start aspirin 81 mg daily 7-10 days post ICH   Antony Contras, MD Stroke Neurology 04/11/2015 1:28 PM    To contact Stroke Continuity provider, please refer to [email or web address] After hours, contact General Neurology

## 2015-04-11 NOTE — Care Management Note (Signed)
Case Management Note  Patient Details  Name: Nicole [name] MRN: [id number] Date of Birth: 09-19-1930  Subjective/Objective:    Pt adm on 04/04/15 with large ICH.  PTA, pt resided at home and was independent.  She has 2 sons, and a boyfriend.  PT recommending CIR.                   Action/Plan: CIR following for dc to inpatient rehab when pt is medically stable, and when bed available.  Will follow progress.    Expected Discharge Date:                  Expected Discharge Plan:  [place]  In-House Referral:     Discharge planning Services  CM Consult  Post Acute Care Choice:    Choice offered to:     DME Arranged:    DME Agency:     HH Arranged:    [place] Agency:     Status of Service:  In process, will continue to follow  Medicare Important Message Given:  Yes-third notification given Date Medicare IM Given:    Medicare IM give by:    Date Additional Medicare IM Given:    Additional Medicare Important Message give by:     If discussed at [place] of Stay Meetings, dates discussed:    Additional Comments:  Reinaldo Raddle, RN, BSN  Trauma/Neuro ICU Case Manager [phone]

## 2015-04-11 NOTE — Progress Notes (Signed)
MBSS complete. Full report located under chart review in imaging section.  Leah McCoy MA, CCC-SLP [phone]   

## 2015-04-12 ENCOUNTER — Inpatient Hospital Stay (HOSPITAL_COMMUNITY)
Admission: RE | Admit: 2015-04-12 | Discharge: 2015-05-02 | DRG: 065 | Disposition: A | Discharge status: Skilled Nursing Facility | Payer: Medicare Other | Source: Intra-hospital | Attending: Physical Medicine & Rehabilitation | Admitting: Physical Medicine & Rehabilitation

## 2015-04-12 DIAGNOSIS — I5032 Chronic diastolic (congestive) heart failure: Secondary | ICD-10-CM | POA: Insufficient documentation

## 2015-04-12 DIAGNOSIS — R4189 Other symptoms and signs involving cognitive functions and awareness: Secondary | ICD-10-CM | POA: Diagnosis present

## 2015-04-12 DIAGNOSIS — E871 Hypo-osmolality and hyponatremia: Secondary | ICD-10-CM | POA: Diagnosis not present

## 2015-04-12 DIAGNOSIS — E1140 Type 2 diabetes mellitus with diabetic neuropathy, unspecified: Secondary | ICD-10-CM | POA: Diagnosis not present

## 2015-04-12 DIAGNOSIS — E1142 Type 2 diabetes mellitus with diabetic polyneuropathy: Secondary | ICD-10-CM | POA: Diagnosis not present

## 2015-04-12 DIAGNOSIS — N189 Chronic kidney disease, unspecified: Secondary | ICD-10-CM | POA: Diagnosis not present

## 2015-04-12 DIAGNOSIS — M6281 Muscle weakness (generalized): Secondary | ICD-10-CM | POA: Diagnosis not present

## 2015-04-12 DIAGNOSIS — G8194 Hemiplegia, unspecified affecting left nondominant side: Secondary | ICD-10-CM | POA: Insufficient documentation

## 2015-04-12 DIAGNOSIS — E11649 Type 2 diabetes mellitus with hypoglycemia without coma: Secondary | ICD-10-CM | POA: Diagnosis not present

## 2015-04-12 DIAGNOSIS — E119 Type 2 diabetes mellitus without complications: Secondary | ICD-10-CM | POA: Diagnosis not present

## 2015-04-12 DIAGNOSIS — E785 Hyperlipidemia, unspecified: Secondary | ICD-10-CM | POA: Diagnosis not present

## 2015-04-12 DIAGNOSIS — M25512 Pain in left shoulder: Secondary | ICD-10-CM | POA: Diagnosis not present

## 2015-04-12 DIAGNOSIS — G8190 Hemiplegia, unspecified affecting unspecified side: Secondary | ICD-10-CM

## 2015-04-12 DIAGNOSIS — R41841 Cognitive communication deficit: Secondary | ICD-10-CM | POA: Diagnosis not present

## 2015-04-12 DIAGNOSIS — I610 Nontraumatic intracerebral hemorrhage in hemisphere, subcortical: Secondary | ICD-10-CM | POA: Diagnosis not present

## 2015-04-12 DIAGNOSIS — E039 Hypothyroidism, unspecified: Secondary | ICD-10-CM | POA: Diagnosis present

## 2015-04-12 DIAGNOSIS — I481 Persistent atrial fibrillation: Secondary | ICD-10-CM | POA: Diagnosis not present

## 2015-04-12 DIAGNOSIS — E1122 Type 2 diabetes mellitus with diabetic chronic kidney disease: Secondary | ICD-10-CM | POA: Diagnosis not present

## 2015-04-12 DIAGNOSIS — I4891 Unspecified atrial fibrillation: Secondary | ICD-10-CM | POA: Diagnosis present

## 2015-04-12 DIAGNOSIS — R32 Unspecified urinary incontinence: Secondary | ICD-10-CM | POA: Diagnosis not present

## 2015-04-12 DIAGNOSIS — I5022 Chronic systolic (congestive) heart failure: Secondary | ICD-10-CM | POA: Diagnosis present

## 2015-04-12 DIAGNOSIS — I619 Nontraumatic intracerebral hemorrhage, unspecified: Secondary | ICD-10-CM | POA: Diagnosis not present

## 2015-04-12 DIAGNOSIS — M109 Gout, unspecified: Secondary | ICD-10-CM | POA: Diagnosis present

## 2015-04-12 DIAGNOSIS — I613 Nontraumatic intracerebral hemorrhage in brain stem: Secondary | ICD-10-CM | POA: Diagnosis not present

## 2015-04-12 DIAGNOSIS — R262 Difficulty in walking, not elsewhere classified: Secondary | ICD-10-CM | POA: Diagnosis not present

## 2015-04-12 DIAGNOSIS — R1312 Dysphagia, oropharyngeal phase: Secondary | ICD-10-CM | POA: Diagnosis not present

## 2015-04-12 DIAGNOSIS — I4819 Other persistent atrial fibrillation: Secondary | ICD-10-CM | POA: Insufficient documentation

## 2015-04-12 DIAGNOSIS — I129 Hypertensive chronic kidney disease with stage 1 through stage 4 chronic kidney disease, or unspecified chronic kidney disease: Secondary | ICD-10-CM | POA: Diagnosis present

## 2015-04-12 DIAGNOSIS — I5030 Unspecified diastolic (congestive) heart failure: Secondary | ICD-10-CM | POA: Diagnosis not present

## 2015-04-12 DIAGNOSIS — E876 Hypokalemia: Secondary | ICD-10-CM | POA: Diagnosis present

## 2015-04-12 DIAGNOSIS — G629 Polyneuropathy, unspecified: Secondary | ICD-10-CM | POA: Diagnosis not present

## 2015-04-12 DIAGNOSIS — R278 Other lack of coordination: Secondary | ICD-10-CM | POA: Diagnosis not present

## 2015-04-12 DIAGNOSIS — I69121 Dysphasia following nontraumatic intracerebral hemorrhage: Secondary | ICD-10-CM | POA: Diagnosis not present

## 2015-04-12 LAB — CBC
HCT: 34.9 % — ABNORMAL LOW (ref 36.0–46.0)
HEMOGLOBIN: 12.2 g/dL (ref 12.0–15.0)
MCH: 26.9 pg (ref 26.0–34.0)
MCHC: 35 g/dL (ref 30.0–36.0)
MCV: 76.9 fL — ABNORMAL LOW (ref 78.0–100.0)
PLATELETS: 259 10*3/uL (ref 150–400)
RBC: 4.54 MIL/uL (ref 3.87–5.11)
RDW: 17.2 % — ABNORMAL HIGH (ref 11.5–15.5)
WBC: 10.8 10*3/uL — AB (ref 4.0–10.5)

## 2015-04-12 LAB — CREATININE, SERUM
CREATININE: 1.16 mg/dL — AB (ref 0.44–1.00)
GFR calc Af Amer: 49 mL/min — ABNORMAL LOW (ref >60)
GFR, EST NON AFRICAN AMERICAN: 43 mL/min — AB (ref >60)

## 2015-04-12 LAB — GLUCOSE, CAPILLARY
GLUCOSE-CAPILLARY: 128 mg/dL — AB (ref 65–99)
GLUCOSE-CAPILLARY: 157 mg/dL — AB (ref 65–99)
GLUCOSE-CAPILLARY: 107 mg/dL — AB (ref 65–99)
Glucose-Capillary: 116 mg/dL — ABNORMAL HIGH (ref 65–99)

## 2015-04-12 MED ORDER — CLONIDINE HCL 0.2 MG PO TABS
0.2000 mg | ORAL_TABLET | Freq: Three times a day (TID) | ORAL | Status: DC
  Administered 2015-04-12 – 2015-05-02 (×57): 0.2 mg via ORAL
  Filled 2015-04-12 (×38): qty 1
  Filled 2015-04-12: qty 2
  Filled 2015-04-12 (×20): qty 1

## 2015-04-12 MED ORDER — INSULIN ASPART 100 UNIT/ML ~~LOC~~ SOLN
0.0000 [IU] | Freq: Three times a day (TID) | SUBCUTANEOUS | Status: DC
  Administered 2015-04-13: 3 [IU] via SUBCUTANEOUS
  Administered 2015-04-13 – 2015-04-14 (×5): 2 [IU] via SUBCUTANEOUS
  Administered 2015-04-15: 3 [IU] via SUBCUTANEOUS
  Administered 2015-04-15: 2 [IU] via SUBCUTANEOUS
  Administered 2015-04-15: 5 [IU] via SUBCUTANEOUS
  Administered 2015-04-16: 3 [IU] via SUBCUTANEOUS
  Administered 2015-04-16: 5 [IU] via SUBCUTANEOUS
  Administered 2015-04-16: 2 [IU] via SUBCUTANEOUS
  Administered 2015-04-17 (×2): 3 [IU] via SUBCUTANEOUS
  Administered 2015-04-17: 5 [IU] via SUBCUTANEOUS
  Administered 2015-04-18: 3 [IU] via SUBCUTANEOUS
  Administered 2015-04-18: 8 [IU] via SUBCUTANEOUS
  Administered 2015-04-18: 2 [IU] via SUBCUTANEOUS
  Administered 2015-04-19: 5 [IU] via SUBCUTANEOUS
  Administered 2015-04-19: 3 [IU] via SUBCUTANEOUS
  Administered 2015-04-19: 5 [IU] via SUBCUTANEOUS
  Administered 2015-04-20: 3 [IU] via SUBCUTANEOUS
  Administered 2015-04-20: 5 [IU] via SUBCUTANEOUS
  Administered 2015-04-20: 3 [IU] via SUBCUTANEOUS
  Administered 2015-04-21: 8 [IU] via SUBCUTANEOUS
  Administered 2015-04-21 – 2015-04-22 (×3): 3 [IU] via SUBCUTANEOUS
  Administered 2015-04-22: 2 [IU] via SUBCUTANEOUS
  Administered 2015-04-23: 3 [IU] via SUBCUTANEOUS
  Administered 2015-04-23: 2 [IU] via SUBCUTANEOUS
  Administered 2015-04-23: 3 [IU] via SUBCUTANEOUS
  Administered 2015-04-24 (×2): 2 [IU] via SUBCUTANEOUS
  Administered 2015-04-24: 5 [IU] via SUBCUTANEOUS
  Administered 2015-04-25: 2 [IU] via SUBCUTANEOUS
  Administered 2015-04-25: 3 [IU] via SUBCUTANEOUS
  Administered 2015-04-25: 2 [IU] via SUBCUTANEOUS
  Administered 2015-04-26 (×2): 3 [IU] via SUBCUTANEOUS
  Administered 2015-04-26: 2 [IU] via SUBCUTANEOUS
  Administered 2015-04-27: 3 [IU] via SUBCUTANEOUS
  Administered 2015-04-27: 5 [IU] via SUBCUTANEOUS
  Administered 2015-04-28 (×2): 2 [IU] via SUBCUTANEOUS
  Administered 2015-04-28: 3 [IU] via SUBCUTANEOUS
  Administered 2015-04-29: 5 [IU] via SUBCUTANEOUS
  Administered 2015-04-29: 2 [IU] via SUBCUTANEOUS
  Administered 2015-04-30 – 2015-05-01 (×4): 3 [IU] via SUBCUTANEOUS
  Administered 2015-05-01: 2 [IU] via SUBCUTANEOUS
  Administered 2015-05-02: 3 [IU] via SUBCUTANEOUS
  Administered 2015-05-02: 2 [IU] via SUBCUTANEOUS

## 2015-04-12 MED ORDER — HEPARIN SODIUM (PORCINE) 5000 UNIT/ML IJ SOLN
5000.0000 [IU] | Freq: Three times a day (TID) | INTRAMUSCULAR | Status: DC
  Administered 2015-04-12 – 2015-04-20 (×22): 5000 [IU] via SUBCUTANEOUS
  Filled 2015-04-12 (×22): qty 1

## 2015-04-12 MED ORDER — DILTIAZEM HCL ER COATED BEADS 240 MG PO CP24
240.0000 mg | ORAL_CAPSULE | Freq: Every day | ORAL | Status: DC
Start: 2015-04-13 — End: 2015-04-23
  Administered 2015-04-13 – 2015-04-22 (×10): 240 mg via ORAL
  Filled 2015-04-12 (×14): qty 1

## 2015-04-12 MED ORDER — METOPROLOL TARTRATE 25 MG PO TABS
25.0000 mg | ORAL_TABLET | Freq: Two times a day (BID) | ORAL | Status: DC
Start: 2015-04-12 — End: 2015-04-22
  Administered 2015-04-12 – 2015-04-21 (×17): 25 mg via ORAL
  Filled 2015-04-12 (×19): qty 1

## 2015-04-12 MED ORDER — ALLOPURINOL 100 MG PO TABS
100.0000 mg | ORAL_TABLET | Freq: Every day | ORAL | Status: DC
  Administered 2015-04-13 – 2015-05-02 (×20): 100 mg via ORAL
  Filled 2015-04-12 (×20): qty 1

## 2015-04-12 MED ORDER — ENSURE ENLIVE PO LIQD
237.0000 mL | Freq: Two times a day (BID) | ORAL | Status: DC
  Administered 2015-04-13 – 2015-04-19 (×10): 237 mL via ORAL

## 2015-04-12 MED ORDER — PANTOPRAZOLE SODIUM 40 MG PO TBEC
40.0000 mg | DELAYED_RELEASE_TABLET | Freq: Every day | ORAL | Status: DC
Start: 2015-04-13 — End: 2015-04-23
  Administered 2015-04-13 – 2015-04-22 (×10): 40 mg via ORAL
  Filled 2015-04-12 (×10): qty 1

## 2015-04-12 MED ORDER — ACETAMINOPHEN 325 MG PO TABS
650.0000 mg | ORAL_TABLET | ORAL | Status: DC | PRN
  Administered 2015-04-13 – 2015-04-24 (×4): 650 mg via ORAL
  Filled 2015-04-12 (×6): qty 2

## 2015-04-12 MED ORDER — ONDANSETRON HCL 4 MG PO TABS: 4.0000 mg | ORAL_TABLET | Freq: Four times a day (QID) | ORAL | Status: DC | PRN

## 2015-04-12 MED ORDER — LISINOPRIL 20 MG PO TABS
20.0000 mg | ORAL_TABLET | Freq: Two times a day (BID) | ORAL | Status: DC
  Administered 2015-04-12 – 2015-05-02 (×38): 20 mg via ORAL
  Filled 2015-04-12 (×40): qty 1

## 2015-04-12 MED ORDER — LEVOTHYROXINE SODIUM 100 MCG PO TABS
50.0000 ug | ORAL_TABLET | Freq: Every day | ORAL | Status: DC
  Administered 2015-04-13 – 2015-05-02 (×20): 50 ug via ORAL
  Filled 2015-04-12 (×23): qty 1

## 2015-04-12 MED ORDER — SORBITOL 70 % SOLN
30.0000 mL | Freq: Every day | Status: DC | PRN
  Administered 2015-04-19: 30 mL via ORAL
  Filled 2015-04-12: qty 30

## 2015-04-12 MED ORDER — ONDANSETRON HCL 4 MG/2ML IJ SOLN: 4.0000 mg | Freq: Four times a day (QID) | INTRAMUSCULAR | Status: DC | PRN

## 2015-04-12 MED ORDER — HEPARIN SODIUM (PORCINE) 5000 UNIT/ML IJ SOLN: 5000.0000 [IU] | Freq: Three times a day (TID) | INTRAMUSCULAR | Status: DC

## 2015-04-12 MED ORDER — SENNOSIDES-DOCUSATE SODIUM 8.6-50 MG PO TABS
1.0000 | ORAL_TABLET | Freq: Two times a day (BID) | ORAL | Status: DC
  Administered 2015-04-12 – 2015-04-14 (×5): 1 via ORAL
  Filled 2015-04-12 (×5): qty 1

## 2015-04-12 NOTE — Progress Notes (Addendum)
Speech Language Pathology Treatment: Dysphagia  Patient Details Name: Nicole [name] MRN: [id number] DOB: 1931-01-27 Today's Date: 04/12/2015 Time: 0370-4888 SLP Time Calculation (min) (ACUTE ONLY): 18 min  Assessment / Plan / Recommendation Clinical Impression  Pt today is sleepy but agreed to participate in SLP session.  SLP observed pt self feeding orange juice, Ensure and softened graham crackers - oral delays continue but pt responds to moderate verbal cues to swallow.   Throat clear noted x2 during intake, no overt indications of airway compromise with intake.     Pt started nodding off while holding her Ensure liquid supplement - SLP removed it from her hand and repositioned pt for maximum comfort.  Nearly untouched breakfast tray at bedside, pt stated "I didn't get that".   Pt appears slow processing information and conducting motor skills.  Concern for adequacy of nutrition present but hopeful for improvement.   Recommend continue diet with strict precautions.    She was disoriented to place, improperly selecting "school" from choice of two "school", "hospital".   Please order speech/cognitive evaluation if you agree.     HPI Other Pertinent Information: Nicole [name] is an 81 y.o. female history of atrial fibrillation on Coumadin, hypertension, hyperlipidemia, diabetes mellitus, CHF and chronic kidney disease, brought to the emergency room after being found on the floor home. CT scan of her head showed fairly large intracerebral hemorrhage involving the right caudate and extending into right ventricle with mass effect with mild midline shift as well as right ventricular enlargement. evaluated by Dr. Saintclair Halsted of the Neurosurgery Service. Ventricular drain is not felt to be warranted at this time.   Pertinent Vitals Pain Assessment: No/denies pain  SLP Plan  Continue with current plan of care    Recommendations Diet recommendations: Dysphagia 3 (mechanical soft);Thin liquid Liquids  provided via: Straw;Cup Medication Administration: Whole meds with puree Supervision: Full supervision/cueing for compensatory strategies;Patient able to self feed Compensations: Slow rate;Small sips/bites;Check for pocketing;Other (Comment) (cue pt to swallow prn, consume liquids t/o meal) Postural Changes and/or Swallow Maneuvers: Seated upright 90 degrees;Upright 30-60 min after meal              Oral Care Recommendations:  (oral care after meals to assure adequate clearance due to oral deficits) Plan: Continue with current plan of care    [place], [place] [facility] SLP [phone]

## 2015-04-12 NOTE — Progress Notes (Signed)
Report given to Kirvin on Rehab. Patient will be transfer to 4M03. Family aware of disposition. All belongings sent with patient.  Havy. RN

## 2015-04-12 NOTE — PMR Pre-admission (Signed)
PMR Admission Coordinator Pre-Admission Assessment  Patient: Nicole [name] is an 81 y.o., female MRN: [id number] DOB: June 13, 1931 Height: 5\' 8"  (172.7 cm) Weight: 80.7 kg (177 lb 14.6 oz)              Insurance Information HMO: yes    PPO:      PCP:      IPA:      80/20:      OTHER: medicare advantage plan PRIMARY: Bubba Hales      Policy#: [id number]      Subscriber: pt CM Name: Jenny Reichmann      Phone#: [phone]     Fax#: [facility] online approved for 7 days [place] to f/c [phone] EMR access Pre-Cert#: [id number]      Employer: retired Benefits:  Phone #: [phone]     Name: 04/11/15 Eff. Date: 07/23/14     Deduct: none      Out of Pocket Max: $4900      Life Max: none CIR: $345 per day days 1-5 then covers 100%      SNF: no copay days 1-20; $160 per day days 21-51, no cpay days 52-100 Outpatient: $40 copay per visit     Co-Pay: no visit limit Home Health: 100%      Co-Pay: no visit limit DME: 80%     Co-Pay: 20% Providers: in network  SECONDARY: none      Medicaid Application Date:       Case Manager:  Disability Application Date:       Case Worker:   Emergency Contact Information Contact Information    Name Relation Home Work Mobile   [place] Son [place] Son   [phone]   [name] Daughter [phone]  [phone]   Kirby Funk [phone]  [phone]     Current Medical History  Patient Admitting Diagnosis: right caudate ICH  History of Present Illness: Nicole [name] is a 81 y.o. right handed female with history of hypertension, systolic congestive heart failure, diabetes mellitus peripheral neuropathy, chronic renal insufficiency with baseline creatinine 1.35, atrial fibrillation on chronic Coumadin. Patient independent prior to admission still driving use a single-point cane living alone. Presented 04/04/2015 with altered mental status as well as headache. Blood pressure 166/103. INR 3.6. CT of the head showed a large  acute hemorrhage centered in the region of the right caudate nucleus with rupture into the lateral ventricle and evidence of developing hydrocephalus. Parenchymal hemorrhage measuring roughly 3.2 x 4.6 x 3.0 cm. Neurosurgery Dr. Saintclair Halsted consulted advise conservative care. Coumadin was reversed. Follow-up serial cranial CT scans 04/07/2015 showing evolving right basal ganglia hematoma with intraventricular extension. Echocardiogram with ejection fraction of 74% normal systolic function. Renal artery duplex with findings of 1-59 % renal artery stenosis and close monitoring of blood pressure was advised. Maintained on a mechanical soft diet.. Subcutaneous heparin added for DVT prophylaxis 04/08/2015. Physical and occupational therapy evaluations completed 04/06/2015 with recommendations of physical medicine rehabilitation consult. Patient was admitted for a comprehensive rehabilitation program  .Total: 5  NIH     Past Medical History  Past Medical History  Diagnosis Date  . Colon cancer 07/1997  . Hypertension   . Diabetes mellitus   . CHF (congestive heart failure)   . AAA (abdominal aortic aneurysm)   . Atrial fibrillation   . Hypercholesteremia   . Kidney stone     renal calculi  . Small bowel obstruction due to adhesions 05/16/2012  . Hypothyroidism   . Arthritis   .  Anemia   . Cholelithiasis   . Perforation of bile duct   . Hemorrhage of rectum and anus   . Swelling, mass, or lump in head and neck   . Cervicalgia   . Hypoglycemia, unspecified   . Pain in joint, lower leg   . Anxiety   . Shortness of breath   . Acute upper respiratory infections of unspecified site   . Depression   . Thoracic aneurysm without mention of rupture   . GERD (gastroesophageal reflux disease)   . Proteinuria   . Other specified cardiac dysrhythmias(427.89)   . Congestive heart failure, unspecified   . Electrolyte and fluid disorders not elsewhere classified   . Other chronic allergic conjunctivitis   .  Hypopotassemia   . Chronic systolic heart failure   . Dizziness and giddiness   . Malignant neoplasm of colon, unspecified site   . Other and unspecified hyperlipidemia   . Gout, unspecified   . Atrial fibrillation   . Aortic aneurysm of unspecified site without mention of rupture   . Pain in joint, site unspecified   . Palpitations   . CKD (chronic kidney disease)   . Diabetes mellitus     Family History  family history includes Diabetes in her sister; Heart disease in her mother; Hypertension in her daughter, son, and son; Stroke in her father.  Prior Rehab/Hospitalizations:  Has the patient had major surgery during 100 days prior to admission? No  Current Medications   Current facility-administered medications:  .  0.9 %  sodium chloride infusion, , Intravenous, Continuous, Rosalin Hawking, MD, Last Rate: 75 mL/hr at 04/11/15 1914 .  acetaminophen (TYLENOL) suppository 650 mg, 650 mg, Rectal, Q4H PRN, Greta Doom, MD, 650 mg at 04/09/15 2011 .  acetaminophen (TYLENOL) tablet 650 mg, 650 mg, Oral, Q4H PRN, 650 mg at 04/09/15 1002 **OR** [DISCONTINUED] acetaminophen (TYLENOL) suppository 650 mg, 650 mg, Rectal, Q4H PRN, Wallie Char .  allopurinol (ZYLOPRIM) tablet 100 mg, 100 mg, Oral, Daily, Rosalin Hawking, MD, 100 mg at 04/12/15 0854 .  bisacodyl (DULCOLAX) suppository 10 mg, 10 mg, Rectal, Daily, Garvin Fila, MD, 10 mg at 04/12/15 0854 .  cloNIDine (CATAPRES) tablet 0.2 mg, 0.2 mg, Oral, TID, Rosalin Hawking, MD, 0.2 mg at 04/12/15 0853 .  diltiazem (CARDIZEM CD) 24 hr capsule 240 mg, 240 mg, Oral, Daily, Rosalin Hawking, MD, 240 mg at 04/12/15 0854 .  feeding supplement (ENSURE ENLIVE) (ENSURE ENLIVE) liquid 237 mL, 237 mL, Oral, BID BM, Heather C Pitts, RD, 237 mL at 04/12/15 1000 .  feeding supplement (PIVOT 1.5 CAL) liquid 1,000 mL, 1,000 mL, Per Tube, Continuous, Wallie Char, Stopped at 04/11/15 0720 .  heparin injection 5,000 Units, 5,000 Units, Subcutaneous, 3 times per  day, Rosalin Hawking, MD, 5,000 Units at 04/12/15 [phone] .  hydrALAZINE (APRESOLINE) injection 5-20 mg, 5-20 mg, Intravenous, Q4H PRN, Rosalin Hawking, MD, 20 mg at 04/10/15 2003 .  insulin aspart (novoLOG) injection 0-15 Units, 0-15 Units, Subcutaneous, TID WC, Rosalin Hawking, MD, 2 Units at 04/12/15 0724 .  labetalol (NORMODYNE,TRANDATE) injection 10-40 mg, 10-40 mg, Intravenous, Q10 min PRN, Garvin Fila, MD, 20 mg at 04/10/15 1652 .  levothyroxine (SYNTHROID, LEVOTHROID) tablet 50 mcg, 50 mcg, Oral, QAC breakfast, Rosalin Hawking, MD, 50 mcg at 04/12/15 0854 .  lisinopril (PRINIVIL,ZESTRIL) tablet 20 mg, 20 mg, Oral, BID, Rosalin Hawking, MD, 20 mg at 04/12/15 0853 .  metoprolol tartrate (LOPRESSOR) tablet 25 mg, 25 mg, Oral, BID, Rosalin Hawking, MD, 25 mg  at 04/12/15 0853 .  pantoprazole (PROTONIX) EC tablet 40 mg, 40 mg, Oral, Daily, Rosalin Hawking, MD, 40 mg at 04/12/15 0853 .  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 None of the above  Prior Functional Level Prior Function Level of Independence: Independent Comments: very active and Independent  Self Care: Did the patient need help bathing, dressing, using the toilet or eating?  Independent  Indoor Mobility: Did the patient need assistance with walking from room to room (with or  without device)? Independent  Stairs: Did the patient need assistance with internal or external stairs (with or without device)? Independent  Functional Cognition: Did the patient need help planning regular tasks such as shopping or remembering to take medications? Independent  Current Functional Level Cognition  Overall Cognitive Status: Impaired/Different from baseline Current Attention Level: Sustained Orientation Level: Oriented to person Following Commands: Follows one step commands with increased time, Follows one step commands inconsistently Safety/Judgement: Decreased awareness of deficits General Comments: pt very flat without initiation of any mvmt requiring max tactile cues to complete task    Extremity Assessment (includes Sensation/Coordination)  Upper Extremity Assessment: LUE deficits/detail LUE Deficits / Details: Able to use functionally, but generalized weakness LUE Coordination: decreased fine motor  Lower Extremity Assessment: Defer to PT evaluation    ADLs  Overall ADL's : Needs assistance/impaired Eating/Feeding: Set up Grooming: Supervision/safety, Set up Grooming Details (indicate cue type and reason): cues to attend and sequence Upper Body Bathing: Supervision/ safety, Set up, Sitting Upper Body Bathing Details (indicate cue type and reason): cues to complete tasks and attend Lower Body Bathing: Minimal assistance, Sit to/from stand Upper Body Dressing : Moderate assistance, Sitting Lower Body Dressing: Moderate assistance, Sit to/from stand Lower Body Dressing Details (indicate cue type and reason): trying to pull up her SCD hose as if they were her socks. Kept taking sock off with cues to donn socks Toilet Transfer: Minimal assistance, Ambulation Toileting- Clothing Manipulation and Hygiene: Minimal assistance, Sit to/from stand Functional mobility during ADLs: Minimal assistance, +2 for safety/equipment, Cueing for sequencing, Cueing for safety (short  shuffling gait pattern. ) General ADL Comments: difficulty with initiation of movement and attending to tasks to cmoplete    Mobility  Overal bed mobility: Needs Assistance Bed Mobility: Supine to Sit Supine to sit: HOB elevated, Mod assist General bed mobility comments: pt up in chair    Transfers  Overall transfer level: Needs assistance Equipment used: Rolling walker (2 wheeled) Transfers: Sit to/from Stand Sit to Stand: Mod assist Stand pivot transfers: Min assist General transfer comment: max directional verbal and tactile cues for anterior weight shift and to push up from chair    Ambulation / Gait / Stairs / Wheelchair Mobility  Ambulation/Gait Ambulation/Gait assistance: Mod assist, +2 physical assistance, +2 safety/equipment Ambulation Distance (Feet): 50 Feet (x2) Assistive device: Rolling walker (2 wheeled), 2 person hand held  assist Gait Pattern/deviations: Step-through pattern, Decreased stride length, Shuffle General Gait Details: trialed both with RW and bilat HHA. pt required max tactile cues to advance L LE and to clear foot. pt with improved sequencing when ambulating with bilat HHA most likely due to PT and Tech maintaining momentum and pt picking up habitual pattern Gait velocity: extremely slow Gait velocity interpretation: Below normal speed for age/gender    Posture / Balance Dynamic Sitting Balance Sitting balance - Comments: minguard at least for balance at edge of bed Balance Overall balance assessment: Needs assistance Sitting-balance support: Feet supported, Bilateral upper extremity supported Sitting balance-Leahy Scale: Poor Sitting balance - Comments: minguard at least for balance at edge of bed Postural control: Posterior lean Standing balance support: Bilateral upper extremity supported Standing balance-Leahy Scale: Poor Standing balance comment: needs UE support but can balance with supervision momentarily    Special needs/care consideration  Bowel mgmt: incontinent LBM 9/12 Bladder mgmt:incontinent Diabetic mgmt Hgb A1c 5.9   Previous Home Environment Living Arrangements: Alone  Lives With: Alone Available Help at Discharge: Family, Available 24 hours/day Type of Home: House Home Layout: One level Home Access: Stairs to enter Entrance Stairs-Rails: Right Entrance Stairs-Number of Steps: 2 Bathroom Shower/Tub: Multimedia programmer: Handicapped height Bathroom Accessibility: Yes How Accessible: Accessible via walker Home Care Services: No Additional Comments: had friend, [name] , of 11 yrs who does not live with her  Discharge Living Setting Plans for Discharge Living Setting: Patient's home, Alone Type of Home at Discharge: House Discharge Home Layout: One level Discharge Home Access: Stairs to enter Entrance Stairs-Rails: Right Entrance Stairs-Number of Steps: 2 Discharge Bathroom Shower/Tub: Walk-in shower Discharge Bathroom Toilet: Handicapped height Discharge Bathroom Accessibility: Yes How Accessible: Accessible via walker Does the patient have any problems obtaining your medications?: No  Social/Family/Support Systems Patient Roles: Spouse, Parent, Science writer Information: [place] or [name], sons most accessible Anticipated Caregiver: children and boyfriend to provide 24/7 supervision Anticipated Caregiver's Contact Information: see above Ability/Limitations of Caregiver: sons and family will work out caregivers Caregiver Availability: 24/7 Discharge Plan Discussed with Primary Caregiver: Yes Is Caregiver In Agreement with Plan?: Yes Does Caregiver/Family have Issues with Lodging/Transportation while Pt is in Rehab?: No   Goals/Additional Needs Patient/Family Goal for Rehab: Supervision with PT, OT, and SLP Expected length of stay: ELOS 18-22 days Pt/Family Agrees to Admission and willing to participate: Yes Program Orientation Provided & Reviewed with Pt/Caregiver Including Roles  &  Responsibilities: Yes   Decrease burden of Care through IP rehab admission: n/a   Possible need for SNF placement upon discharge: n/a  Patient Condition: This patient's medical and functional status has changed since the consult dated: 04/07/2015 in which the Rehabilitation Physician determined and documented that the patient's condition is appropriate for intensive rehabilitative care in an inpatient rehabilitation facility. See "History of Present Illness" (above) for medical update. Functional changes are: mod assist. Patient's medical and functional status update has been discussed with the Rehabilitation physician and patient remains appropriate for inpatient rehabilitation. Will admit to inpatient rehab today.  Preadmission Screen Completed By:  Cleatrice Burke, 04/12/2015 1:43 PM ______________________________________________________________________   Discussed status with Dr. Naaman Plummer on 04/12/15 at  1342 and received telephone approval for admission today.  Admission Coordinator:  [place], K[place], time [id number] Date 04/12/2015

## 2015-04-12 NOTE — H&P (View-Only) (Signed)
Physical Medicine and Rehabilitation Admission H&P    Chief Complaint  Patient presents with  . Atrial Fibrillation  : HPI: Nicole [name] is a 81 y.o. right handed female with history of hypertension, systolic congestive heart failure, diabetes mellitus peripheral neuropathy, chronic renal insufficiency with baseline creatinine 1.35, atrial fibrillation on chronic Coumadin. Patient independent prior to admission still driving use a single-point cane living alone. Presented 04/04/2015 with altered mental status as well as headache. Blood pressure 166/103. INR 3.6. CT of the head showed a large acute hemorrhage centered in the region of the right caudate nucleus with rupture into the lateral ventricle and evidence of developing hydrocephalus. Parenchymal hemorrhage measuring roughly 3.2 x 4.6 x 3.0 cm. Neurosurgery Dr. Saintclair Halsted consulted advise conservative care. Coumadin was reversed. Follow-up serial cranial CT scans 04/07/2015 showing evolving right basal ganglia hematoma with intraventricular extension. Echocardiogram with ejection fraction of 18% normal systolic function. Renal artery duplex with findings of 1-59 % renal artery stenosis and close monitoring of blood pressure was advised. Maintained on a mechanical soft diet.. Subcutaneous heparin added for DVT prophylaxis 04/08/2015. Physical and occupational therapy evaluations completed 04/06/2015 with recommendations of physical medicine rehabilitation consult. Patient was admitted for a comprehensive rehabilitation program   ROS Review of Systems  Constitutional: Negative for fever and chills.  HENT: Negative for hearing loss.  Eyes: Positive for blurred vision. Negative for double vision.  Respiratory: Negative for cough.   Shortness of breath with exertion  Cardiovascular: Positive for palpitations and leg swelling. Negative for chest pain.  Gastrointestinal: Positive for constipation. Negative for nausea, vomiting and abdominal  pain.   GERD  Genitourinary: Negative for dysuria and hematuria.  Musculoskeletal: Positive for myalgias and joint pain.  Neurological: Positive for dizziness and headaches. Negative for loss of consciousness.  Psychiatric/Behavioral:   Anxiety  Past Medical History  Diagnosis Date  . Colon cancer 07/1997  . Hypertension   . Diabetes mellitus   . CHF (congestive heart failure)   . AAA (abdominal aortic aneurysm)   . Atrial fibrillation   . Hypercholesteremia   . Kidney stone     renal calculi  . Small bowel obstruction due to adhesions 05/16/2012  . Hypothyroidism   . Arthritis   . Anemia   . Cholelithiasis   . Perforation of bile duct   . Hemorrhage of rectum and anus   . Swelling, mass, or lump in head and neck   . Cervicalgia   . Hypoglycemia, unspecified   . Pain in joint, lower leg   . Anxiety   . Shortness of breath   . Acute upper respiratory infections of unspecified site   . Depression   . Thoracic aneurysm without mention of rupture   . GERD (gastroesophageal reflux disease)   . Proteinuria   . Other specified cardiac dysrhythmias(427.89)   . Congestive heart failure, unspecified   . Electrolyte and fluid disorders not elsewhere classified   . Other chronic allergic conjunctivitis   . Hypopotassemia   . Chronic systolic heart failure   . Dizziness and giddiness   . Malignant neoplasm of colon, unspecified site   . Other and unspecified hyperlipidemia   . Gout, unspecified   . Atrial fibrillation   . Aortic aneurysm of unspecified site without mention of rupture   . Pain in joint, site unspecified   . Palpitations   . CKD (chronic kidney disease)   . Diabetes mellitus    Past Surgical History  Procedure Laterality Date  .  Hemicolectomy  08/11/1997  . Kidney stone surgery    . Colon surgery      to remove colon ca  . Cholecystectomy  05/14/2012    Procedure: LAPAROSCOPIC CHOLECYSTECTOMY WITH INTRAOPERATIVE CHOLANGIOGRAM;  Surgeon:  Haywood Lasso, MD;  Location: [facility];  Service: General;  Laterality: N/A;  . Ercp  05/17/2012    Procedure: ENDOSCOPIC RETROGRADE CHOLANGIOPANCREATOGRAPHY (ERCP);  Surgeon: Missy Sabins, MD;  Location: [facility];  Service: Gastroenterology;  Laterality: N/A;  . Ercp  08/12/2012    Procedure: ENDOSCOPIC RETROGRADE CHOLANGIOPANCREATOGRAPHY (ERCP);  Surgeon: Missy Sabins, MD;  Location[facility] ENDOSCOPY;  Service: Endoscopy;  Laterality: N/A;  . Transthoracic echocardiogram  11/2009    EF 45-50%, mild-mod AV regurg; mild MR; mod TR; ascending aorta mildly dilated  . Cardiac catheterization  2008    moderate severe pulm HTN; with elevted pulm capillary wedge pressure    Family History  Problem Relation Age of Onset  . Heart disease Mother   . Stroke Father   . Hypertension Daughter   . Hypertension Son   . Diabetes Sister   . Hypertension Son    Social History:  reports that she has never smoked. She has never used smokeless tobacco. She reports that she does not drink alcohol or use illicit drugs. Allergies:  Allergies  Allergen Reactions  . Iohexol      Code: VOM, Desc: PT REPORTS VOMITING W/ IVP DYE- ARS 12/26/08, Onset Date: [id number]   . Z-Pak [Azithromycin]   . Iodinated Diagnostic Agents Nausea Only    PT HAS HAD SINGULAR INCIDENT OF NAUSEA  W/ IV CONTYRAST INJECTION, NONE IF INJECTED SLOWLY//A.CALHOUN   Medications Prior to Admission  Medication Sig Dispense Refill  . allopurinol (ZYLOPRIM) 100 MG tablet Take 1 tablet (100 mg total) by mouth daily. 30 tablet 3  . bacitracin ointment Apply 1 application topically 2 (two) times daily. (Patient taking differently: Apply 1 application topically as needed for wound care. ) 120 g 0  . diltiazem (CARDIZEM CD) 240 MG 24 hr capsule take 1 capsule by mouth once daily 30 capsule 9  . furosemide (LASIX) 40 MG tablet take 1 tablet by mouth twice a day (Patient taking differently: take 1 tablet by mouth once a day) 60 tablet 11  . glipiZIDE  (GLUCOTROL) 10 MG tablet Take one tablet by mouth once daily to control blood sugar (Patient taking differently: Take 10 mg by mouth daily before breakfast. Take one tablet by mouth once daily to control blood sugar) 30 tablet 6  . levothyroxine (SYNTHROID, LEVOTHROID) 50 MCG tablet Take one tablet by mouth 30 minutes before breakfast once daily for thyroid (Patient taking differently: Take 50 mcg by mouth daily before breakfast. Take one tablet by mouth 30 minutes before breakfast once daily for thyroid) 90 tablet 3  . magnesium oxide (MAG-OX) 400 (241.3 MG) MG tablet Take 400 mg by mouth daily.  0  . metFORMIN (GLUCOPHAGE) 500 MG tablet Take 1 tablet (500 mg total) by mouth 2 (two) times daily with a meal. 60 tablet 5  . Multiple Vitamins-Minerals (WOMENS BONE HEALTH PO) Take 1 tablet by mouth daily.    . polyethylene glycol (MIRALAX / GLYCOLAX) packet Take 17 g by mouth daily as needed for mild constipation.     . potassium chloride SA (K-DUR,KLOR-CON) 20 MEQ tablet Take one tablet by mouth once daily for potassium (Patient taking differently: Take 20 mEq by mouth daily. Take one tablet by mouth once daily for potassium)  30 tablet 5  . simvastatin (ZOCOR) 40 MG tablet Take one tablet by mouth once daily for cholesterol (Patient taking differently: Take 40 mg by mouth daily. Take one tablet by mouth once daily for cholesterol) 30 tablet 3  . warfarin (COUMADIN) 5 MG tablet take 1 tablet by mouth once daily or as directed (Patient taking differently: Take 2.5 mg by mouth daily on Sun, Tues, Thurs, and Sat. Take 5 mg by mouth daily on Mon, Wed, and Fri.) 30 tablet 3  . metoprolol (LOPRESSOR) 25 MG tablet Take 1 tablet (25 mg total) by mouth 2 (two) times daily. (Patient not taking: Reported on 04/04/2015) 60 tablet 3    Home: [place] expects to be discharged to:: Private residence Living Arrangements: Alone Available Help at Discharge: Family, Available 24 hours/day Type of Home:  House Home Access: Stairs to enter [employer] of Steps: 2 Entrance Stairs-Rails: Right Home Layout: One level Bathroom Shower/Tub: Multimedia programmer: Handicapped height Bathroom Accessibility: Yes Home Equipment: [place] - single point Additional Comments: had friend, [name] , of 11 yrs who does not live with her  Lives With: Alone   Functional History: Prior Function Level of Independence: Independent Comments: very active and Independent  Functional Status:  Mobility: Bed Mobility Overal bed mobility: Needs Assistance Bed Mobility: Supine to Sit Supine to sit: HOB elevated, Mod assist General bed mobility comments: pt up in chair Transfers Overall transfer level: Needs assistance Equipment used: Rolling walker (2 wheeled) Transfers: Sit to/from Stand Sit to Stand: Mod assist Stand pivot transfers: Min assist General transfer comment: max directional verbal and tactile cues for anterior weight shift and to push up from chair Ambulation/Gait Ambulation/Gait assistance: Mod assist, +2 physical assistance, +2 safety/equipment Ambulation Distance (Feet): 50 Feet (x2) Assistive device: Rolling walker (2 wheeled), 2 person hand held assist Gait Pattern/deviations: Step-through pattern, Decreased stride length, Shuffle General Gait Details: trialed both with RW and bilat HHA. pt required max tactile cues to advance L LE and to clear foot. pt with improved sequencing when ambulating with bilat HHA most likely due to PT and Tech maintaining momentum and pt picking up habitual pattern Gait velocity: extremely slow Gait velocity interpretation: Below normal speed for age/gender    ADL: ADL Overall ADL's : Needs assistance/impaired Eating/Feeding: Set up Grooming: Supervision/safety, Set up Grooming Details (indicate cue type and reason): cues to attend and sequence Upper Body Bathing: Supervision/ safety, Set up, Sitting Upper Body Bathing Details (indicate  cue type and reason): cues to complete tasks and attend Lower Body Bathing: Minimal assistance, Sit to/from stand Upper Body Dressing : Moderate assistance, Sitting Lower Body Dressing: Moderate assistance, Sit to/from stand Lower Body Dressing Details (indicate cue type and reason): trying to pull up her SCD hose as if they were her socks. Kept taking sock off with cues to donn socks Toilet Transfer: Minimal assistance, Ambulation Toileting- Clothing Manipulation and Hygiene: Minimal assistance, Sit to/from stand Functional mobility during ADLs: Minimal assistance, +2 for safety/equipment, Cueing for sequencing, Cueing for safety (short shuffling gait pattern. ) General ADL Comments: difficulty with initiation of movement and attending to tasks to cmoplete  Cognition: Cognition Overall Cognitive Status: Impaired/Different from baseline Orientation Level: Oriented to person Cognition Arousal/Alertness: Lethargic Behavior During Therapy: Flat affect Overall Cognitive Status: Impaired/Different from baseline Area of Impairment: Attention, Memory, Problem solving, Following commands, Safety/judgement Orientation Level: Disoriented to, Place Current Attention Level: Sustained Memory: Decreased short-term memory Following Commands: Follows one step commands with increased time, Follows  one step commands inconsistently Safety/Judgement: Decreased awareness of deficits Awareness: Intellectual Problem Solving: Slow processing, Decreased initiation, Difficulty sequencing, Requires verbal cues, Requires tactile cues General Comments: pt very flat without initiation of any mvmt requiring max tactile cues to complete task  Physical Exam: Blood pressure 137/74, pulse 62, temperature 99.1 F (37.3 C), temperature source Oral, resp. rate 16, height 5\' 8"  (1.727 m), weight 80.7 kg (177 lb 14.6 oz), SpO2 100 %. Physical Exam HENT:  Head: Normocephalic.  Eyes:  Pupils sluggish but reactive to light   Neck: Normal range of motion. Neck supple. No thyromegaly present.  Cardiovascular:  Irregular irregular  Respiratory: Effort normal and breath sounds normal. No respiratory distress.  GI: Soft. Bowel sounds are normal. She exhibits no distension.  Neurological: She is alert.  Mood is flat but appropriate. She makes good eye contact with examiner. Follows simple commands. Fair awareness of her deficits.  Motor strength is 2+ to 3- in the left deltoid, biceps, triceps, grip 3 minus in the left hip flexor and knee extensor; 2 minus in the left ankle and flex plantar flexor 4/5 Right deltoid, biceps, triceps, grip, hip flexor, knee extensor, ankle dorsi flexor and plantar flexor Reduced sensation to touch bilateral feet and bilateral hands Decreased attention and awareness. Needs cues to stay on task. ?mild left sided inattention. Skin: grossly intact Psych: flat, generally cooperative   Results for orders placed or performed during the hospital encounter of 04/04/15 (from the past 48 hour(s))  Glucose, capillary     Status: None   Collection Time: 04/10/15  5:56 PM  Result Value Ref Range   Glucose-Capillary 96 65 - 99 mg/dL  Glucose, capillary     Status: Abnormal   Collection Time: 04/10/15  9:49 PM  Result Value Ref Range   Glucose-Capillary 132 (H) 65 - 99 mg/dL  Glucose, capillary     Status: Abnormal   Collection Time: 04/11/15  8:03 AM  Result Value Ref Range   Glucose-Capillary 148 (H) 65 - 99 mg/dL  Glucose, capillary     Status: Abnormal   Collection Time: 04/11/15 11:36 AM  Result Value Ref Range   Glucose-Capillary 167 (H) 65 - 99 mg/dL  Glucose, capillary     Status: Abnormal   Collection Time: 04/11/15  4:07 PM  Result Value Ref Range   Glucose-Capillary 122 (H) 65 - 99 mg/dL  Glucose, capillary     Status: Abnormal   Collection Time: 04/11/15 10:21 PM  Result Value Ref Range   Glucose-Capillary 122 (H) 65 - 99 mg/dL   Comment 1 Notify RN    Comment 2 Document  in Chart   Glucose, capillary     Status: Abnormal   Collection Time: 04/12/15  6:37 AM  Result Value Ref Range   Glucose-Capillary 128 (H) 65 - 99 mg/dL  Glucose, capillary     Status: Abnormal   Collection Time: 04/12/15 11:30 AM  Result Value Ref Range   Glucose-Capillary 116 (H) 65 - 99 mg/dL   Comment 1 Notify RN    Comment 2 Document in Chart   Glucose, capillary     Status: Abnormal   Collection Time: 04/12/15  4:18 PM  Result Value Ref Range   Glucose-Capillary 157 (H) 65 - 99 mg/dL   Comment 1 Notify RN    Comment 2 Document in Chart    Dg Swallowing Func-speech Pathology  04/11/2015    Objective Swallowing Evaluation:   MBS Patient Details  Name: Nicole [name]  MRN: [id number] Date of Birth: 1931/03/15  Today's Date: 04/11/2015 Time: SLP Start Time (ACUTE ONLY): 1033-SLP Stop Time (ACUTE ONLY): 1048 SLP Time Calculation (min) (ACUTE ONLY): 15 min  Past Medical History:  Past Medical History  Diagnosis Date  . Colon cancer 07/1997  . Hypertension   . Diabetes mellitus   . CHF (congestive heart failure)   . AAA (abdominal aortic aneurysm)   . Atrial fibrillation   . Hypercholesteremia   . Kidney stone     renal calculi  . Small bowel obstruction due to adhesions 05/16/2012  . Hypothyroidism   . Arthritis   . Anemia   . Cholelithiasis   . Perforation of bile duct   . Hemorrhage of rectum and anus   . Swelling, mass, or lump in head and neck   . Cervicalgia   . Hypoglycemia, unspecified   . Pain in joint, lower leg   . Anxiety   . Shortness of breath   . Acute upper respiratory infections of unspecified site   . Depression   . Thoracic aneurysm without mention of rupture   . GERD (gastroesophageal reflux disease)   . Proteinuria   . Other specified cardiac dysrhythmias(427.89)   . Congestive heart failure, unspecified   . Electrolyte and fluid disorders not elsewhere classified   . Other chronic allergic conjunctivitis   . Hypopotassemia   . Chronic systolic heart failure   . Dizziness and  giddiness   . Malignant neoplasm of colon, unspecified site   . Other and unspecified hyperlipidemia   . Gout, unspecified   . Atrial fibrillation   . Aortic aneurysm of unspecified site without mention of rupture   . Pain in joint, site unspecified   . Palpitations   . CKD (chronic kidney disease)   . Diabetes mellitus    Past Surgical History:  Past Surgical History  Procedure Laterality Date  . Hemicolectomy  08/11/1997  . Kidney stone surgery    . Colon surgery      to remove colon ca  . Cholecystectomy  05/14/2012    Procedure: LAPAROSCOPIC CHOLECYSTECTOMY WITH INTRAOPERATIVE  CHOLANGIOGRAM;  Surgeon: Haywood Lasso, MD;  Location: [facility];   Service: General;  Laterality: N/A;  . Ercp  05/17/2012    Procedure: ENDOSCOPIC RETROGRADE CHOLANGIOPANCREATOGRAPHY (ERCP);   Surgeon: Missy Sabins, MD;  Location: [place];  Service: Gastroenterology;   Laterality: N/A;  . Ercp  08/12/2012    Procedure: ENDOSCOPIC RETROGRADE CHOLANGIOPANCREATOGRAPHY (ERCP);   Surgeon: Missy Sabins, MD;  Location: [facility] ENDOSCOPY;  Service: Endoscopy;   Laterality: N/A;  . Transthoracic echocardiogram  11/2009    EF 45-50%, mild-mod AV regurg; mild MR; mod TR; ascending aorta mildly  dilated  . Cardiac catheterization  2008    moderate severe pulm HTN; with elevted pulm capillary wedge pressure    HPI:  Other Pertinent Information: Nicole [name] is an 81 y.o. female history  of atrial fibrillation on Coumadin, hypertension, hyperlipidemia, diabetes  mellitus, CHF and chronic kidney disease, brought to the emergency room  after being found on the floor home. CT scan of her head showed fairly  large intracerebral hemorrhage involving the right caudate and extending  into right ventricle with mass effect with mild midline shift as well as  right ventricular enlargement. evaluated by Dr. Saintclair Halsted of the Neurosurgery  Service. Ventricular drain is not felt to be warranted at this time.  No Data Recorded  Assessment / Plan / Recommendation CHL IP  CLINICAL  IMPRESSIONS 04/11/2015  Therapy Diagnosis Moderate oral phase dysphagia;Mild pharyngeal phase  dysphagia  Clinical Impression Patient presents with a primarily oral phase dysphagia  with secondary pharyngeal component. Oral phase marked by delayed oral  transit with tongue pumping, requiring moderate verbal cueing for  initiation of swallow, all of which likely due to cognitive deficits  (decreased awareness and attention). Self feeding increases A-P oral  transit time. The combination of above results in intermittently delayed  swallow initiation and trace penetration of thin liquids via straw which  consistently clear either during the swallow or after with spontaneous  throat clear/cough. Mild vallecular residuals remain post swallow due to  base of tongue weakness do increase aspiration risk as patient unable to  complete cued dry swallow in attempts to clear. Recommend initiation of  dysphagia 3 solids, thin liquids with full supervision to assist with cues  for rapid oral transit as needed. Allow to self feed.       CHL IP TREATMENT RECOMMENDATION 04/11/2015  Treatment Recommendations Therapy as outlined in treatment plan below     CHL IP DIET RECOMMENDATION 04/11/2015  SLP Diet Recommendations Dysphagia 3 (Mech soft);Thin  Liquid Administration via (None)  Medication Administration Crushed with puree  Compensations Slow rate;Small sips/bites  Postural Changes and/or Swallow Maneuvers (None)     CHL IP OTHER RECOMMENDATIONS 04/11/2015  Recommended Consults (None)  Oral Care Recommendations Oral care BID  Other Recommendations (None)     CHL IP FOLLOW UP RECOMMENDATIONS 04/10/2015  Follow up Recommendations (No Data)     CHL IP FREQUENCY AND DURATION 04/11/2015  Speech Therapy Frequency (ACUTE ONLY) min 3x week  Treatment Duration 2 weeks             CHL IP REASON FOR REFERRAL 04/11/2015  Reason for Referral Objectively evaluate swallowing function            Gabriel Rainwater MA, CCC-SLP [phone]         McCoy  Leah Meryl 04/11/2015, 11:35 AM        Medical Problem List and Plan: 1. Functional deficits secondary to right caudate intercerebral hemorrhage secondary to hypertensive crisis 2.  DVT Prophylaxis/Anticoagulation: Subcutaneous heparin added for DVT prophylaxis 04/08/2015 3. Pain Management: Tylenol as needed 4. Hypertension. Clonidine 0.2 mg 3 times a day, Cardizem 240 mg daily, lisinopril 20 mg twice a day, Lopressor 25 mg twice a day. Will allow a degree of "permissive hypertension" to maintain perfusion, but needs better control. 5. Neuropsych: This patient is capable of making decisions on her own behalf. 6. Skin/Wound Care: Routine skin checks 7. Fluids/Electrolytes/Nutrition: Routine I&O with follow-up chemistries 8. Systolic congestive heart failure. Check daily weights and I's and O's. Patient on Lasix 40 mg daily prior to admission. Resume as needed 9. Atrial fibrillation. Chronic Coumadin discontinued secondary to intracerebral hemorrhage. Cardiac rate controlled. Continue Cardizem and Lopressor 10. Diabetes mellitus peripheral neuropathy. Hemoglobin A1c 5.9. Sliding scale insulin. Check blood sugars before meals and at bedtime. Patient on Glucophage 500 mg twice a day prior to admission. Resume depending on cbg patterns. 11. Chronic renal insufficiency. Baseline creatinine 1.35. Follow-up chemistries in am. Past labs all reviewed. 12. Hypothyroidism. TSH level 2.053. Continue Synthroid 13. History of gout. Zyloprim 100 mg daily. Not active currently  14. Hyperlipidemia. Resume statin at discharge.   Post Admission Physician Evaluation: 1. Functional deficits secondary  to right caudate ICH. 2. Patient is admitted to receive collaborative, interdisciplinary care between the physiatrist, rehab nursing staff, and therapy team. 3.  Patient's level of medical complexity and substantial therapy needs in context of that medical necessity cannot be provided at a lesser intensity of care  such as a SNF. 4. Patient has experienced substantial functional loss from his/her baseline which was documented above under the "Functional History" and "Functional Status" headings.  Judging by the patient's diagnosis, physical exam, and functional history, the patient has potential for functional progress which will result in measurable gains while on inpatient rehab.  These gains will be of substantial and practical use upon discharge  in facilitating mobility and self-care at the household level. 5. Physiatrist will provide 24 hour management of medical needs as well as oversight of the therapy plan/treatment and provide guidance as appropriate regarding the interaction of the two. 6. 24 hour rehab nursing will assist with bladder management, bowel management, safety, skin/wound care, disease management, medication administration, pain management and patient education  and help integrate therapy concepts, techniques,education, etc. 7. PT will assess and treat for/with: Lower extremity strength, range of motion, stamina, balance, functional mobility, safety, adaptive techniques and equipment, NMR, visual-perceptual and cognitive perceptual awareness.   Goals are: supervision. 8. OT will assess and treat for/with: ADL's, functional mobility, safety, upper extremity strength, adaptive techniques and equipment, NMR, visual-perceptual and cognitive perceptual awareness, family education, community reintegration.   Goals are: supervision to min assist. Therapy may proceed with showering this patient. 9. SLP will assess and treat for/with: cognition, communication, family ed.  Goals are: supervision to min assist. 10. Case Management and Social Worker will assess and treat for psychological issues and discharge planning. 11. Team conference will be held weekly to assess progress toward goals and to determine barriers to discharge. 12. Patient will receive at least 3 hours of therapy per day at least 5 days  per week. 13. ELOS: 12-18 days       14. Prognosis:  excellent     Meredith Staggers, MD, [place] [facility] 04/12/2015   04/12/2015

## 2015-04-12 NOTE — Discharge Summary (Signed)
Stroke Discharge Summary  Patient ID: Nicole [name]   MRN: [id number]      DOB: 09-22-1930  Date of Admission: 04/04/2015 Date of Discharge: 04/12/2015  Attending Physician:  Garvin Fila, MD, Stroke MD  Consulting Physician(s):    rehabilitation medicine and Neurosurgery Dr. Letta Pate and Dr. Saintclair Halsted   Patient's PCP:  Hollace Kinnier, DO  Discharge Diagnoses: Right basal ganglia Intracerebral hemorrhage with intraventricular extension but without hydrocephalus the due to  hypertension and warfarin coagulopathy Active Problems:   ICH (intracerebral hemorrhage) BMI  Body mass index is 27.06 kg/(m^2).  Past Medical History  Diagnosis Date  . Colon cancer 07/1997  . Hypertension   . Diabetes mellitus   . CHF (congestive heart failure)   . AAA (abdominal aortic aneurysm)   . Atrial fibrillation   . Hypercholesteremia   . Kidney stone     renal calculi  . Small bowel obstruction due to adhesions 05/16/2012  . Hypothyroidism   . Arthritis   . Anemia   . Cholelithiasis   . Perforation of bile duct   . Hemorrhage of rectum and anus   . Swelling, mass, or lump in head and neck   . Cervicalgia   . Hypoglycemia, unspecified   . Pain in joint, lower leg   . Anxiety   . Shortness of breath   . Acute upper respiratory infections of unspecified site   . Depression   . Thoracic aneurysm without mention of rupture   . GERD (gastroesophageal reflux disease)   . Proteinuria   . Other specified cardiac dysrhythmias(427.89)   . Congestive heart failure, unspecified   . Electrolyte and fluid disorders not elsewhere classified   . Other chronic allergic conjunctivitis   . Hypopotassemia   . Chronic systolic heart failure   . Dizziness and giddiness   . Malignant neoplasm of colon, unspecified site   . Other and unspecified hyperlipidemia   . Gout, unspecified   . Atrial fibrillation   . Aortic aneurysm of unspecified site without mention of rupture   . Pain in joint, site  unspecified   . Palpitations   . CKD (chronic kidney disease)   . Diabetes mellitus    Past Surgical History  Procedure Laterality Date  . Hemicolectomy  08/11/1997  . Kidney stone surgery    . Colon surgery      to remove colon ca  . Cholecystectomy  05/14/2012    Procedure: LAPAROSCOPIC CHOLECYSTECTOMY WITH INTRAOPERATIVE CHOLANGIOGRAM;  Surgeon: Haywood Lasso, MD;  Location: [place];  Service: General;  Laterality: N/A;  . Ercp  05/17/2012    Procedure: ENDOSCOPIC RETROGRADE CHOLANGIOPANCREATOGRAPHY (ERCP);  Surgeon: Missy Sabins, MD;  Location: [place];  Service: Gastroenterology;  Laterality: N/A;  . Ercp  08/12/2012    Procedure: ENDOSCOPIC RETROGRADE CHOLANGIOPANCREATOGRAPHY (ERCP);  Surgeon: Missy Sabins, MD;  Location[facility] END[facility];  Service: Endoscopy;  Laterality: N/A;  . Transthoracic echocardiogram  11/2009    EF 45-50%, mild-mod AV regurg; mild MR; mod TR; ascending aorta mildly dilated  . Cardiac catheterization  2008    moderate severe pulm HTN; with elevted pulm capillary wedge pressure     Medications to be continued on Rehab . allopurinol  100 mg Oral Daily  . bisacodyl  10 mg Rectal Daily  . cloNIDine  0.2 mg Oral TID  . diltiazem  240 mg Oral Daily  . feeding supplement (ENSURE ENLIVE)  237 mL Oral BID BM  . heparin subcutaneous  5,000 Units Subcutaneous 3 times per day  . insulin aspart  0-15 Units Subcutaneous TID WC  . levothyroxine  50 mcg Oral QAC breakfast  . lisinopril  20 mg Oral BID  . metoprolol  25 mg Oral BID  . pantoprazole  40 mg Oral Daily  . senna-docusate  1 tablet Oral BID    LABORATORY STUDIES CBC    Component Value Date/Time   WBC 15.2* 04/10/2015 0237   WBC 6.1 02/17/2015 0810   WBC 7.6 08/14/2012 1014   RBC 4.84 04/10/2015 0237   RBC 4.73 02/17/2015 0810   RBC 4.79 08/14/2012 1014   HGB 13.2 04/10/2015 0237   HGB 12.2 08/14/2012 1014   HCT 37.5 04/10/2015 0237   HCT 39.5 02/17/2015 0810   HCT 37.2 08/14/2012 1014   PLT 282  04/10/2015 0237   PLT 286 08/14/2012 1014   MCV 77.5* 04/10/2015 0237   MCV 77.6* 08/14/2012 1014   MCH 27.3 04/10/2015 0237   MCH 26.4* 02/17/2015 0810   MCH 25.6 08/14/2012 1014   MCHC 35.2 04/10/2015 0237   MCHC 31.6 02/17/2015 0810   MCHC 32.9 08/14/2012 1014   RDW 17.5* 04/10/2015 0237   RDW 18.6* 02/17/2015 0810   RDW 20.2* 08/14/2012 1014   LYMPHSABS 0.6* 04/04/2015 0945   LYMPHSABS 1.2 02/17/2015 0810   LYMPHSABS 2.3 08/14/2012 1014   MONOABS 0.3 04/04/2015 0945   MONOABS 0.7 08/14/2012 1014   EOSABS 0.0 04/04/2015 0945   EOSABS 0.1 10/12/2014 0819   BASOSABS 0.0 04/04/2015 0945   BASOSABS 0.0 02/17/2015 0810   BASOSABS 0.0 08/14/2012 1014   CMP    Component Value Date/Time   NA 139 04/10/2015 0237   NA 145* 02/17/2015 0810   K 4.0 04/10/2015 0237   CL 106 04/10/2015 0237   CO2 21* 04/10/2015 0237   GLUCOSE 134* 04/10/2015 0237   GLUCOSE 54* 02/17/2015 0810   BUN 20 04/10/2015 0237   BUN 12 02/17/2015 0810   CREATININE 1.16* 04/10/2015 0237   CALCIUM 9.5 04/10/2015 0237   PROT 7.8 04/04/2015 0945   PROT 7.0 12/03/2013 0833   ALBUMIN 3.6 04/04/2015 0945   AST 22 04/04/2015 0945   ALT 10* 04/04/2015 0945   ALKPHOS 91 04/04/2015 0945   BILITOT 1.0 04/04/2015 0945   GFRNONAA 43* 04/10/2015 0237   GFRAA 49* 04/10/2015 0237   COAGS Lab Results  Component Value Date   INR 1.28 04/05/2015   INR 1.38 04/04/2015   INR 1.40 04/04/2015   Lipid Panel    Component Value Date/Time   CHOL 161 04/05/2015 0819   CHOL 176 02/17/2015 0810   TRIG 104 04/05/2015 0819   HDL 43 04/05/2015 0819   HDL 70 02/17/2015 0810   CHOLHDL 3.7 04/05/2015 0819   CHOLHDL 2.5 02/17/2015 0810   VLDL 21 04/05/2015 0819   LDLCALC 97 04/05/2015 0819   LDLCALC 92 02/17/2015 0810   HgbA1C  Lab Results  Component Value Date   HGBA1C 5.9* 04/05/2015   Cardiac Panel (last 3 results) No results for input(s): CKTOTAL, CKMB, TROPONINI, RELINDX in the last 72 hours. Urinalysis     Component Value Date/Time   COLORURINE YELLOW 04/06/2015 1140   APPEARANCEUR CLEAR 04/06/2015 1140   LABSPEC 1.013 04/06/2015 1140   PHURINE 6.5 04/06/2015 1140   GLUCOSEU 100* 04/06/2015 1140   HGBUR TRACE* 04/06/2015 1140   BILIRUBINUR NEGATIVE 04/06/2015 1140   KETONESUR NEGATIVE 04/06/2015 1140   PROTEINUR 100* 04/06/2015 1140   UROBILINOGEN 1.0 04/06/2015  [place] 04/06/2015 [place] 04/06/2015 1140   Urine Drug Screen No results found for: LABOPIA, COCAINSCRNUR, LABBENZ, AMPHETMU, THCU, LABBARB  Alcohol Level No results found for: [facility]   SIGNIFICANT DIAGNOSTIC STUDIES  Dg Chest 2 View  04/04/2015 IMPRESSION: Stable mild cardiac enlargement. No active disease.   Ct Head Wo Contrast  04/08/2015 IMPRESSION: 1. Unchanged right basal ganglia hematoma with intraventricular extension and small subarachnoid blood. 2. Stable mild ventriculomegaly.   04/07/2015 IMPRESSION: Evolving RIGHT basal ganglia hematoma with intraventricular extension. Mild RIGHT ventricular entrapment. Increasing small to moderate amount of subarachnoid blood products are likely re- distributed.   04/05/2015 IMPRESSION: Size stable ~ 4 cm right basal ganglia hematoma. Intraventricular hemorrhage has redistributed, accounting for newly seen trace subarachnoid blood. Obstructive right lateral ventriculomegaly which is stable.   04/04/2015 IMPRESSION: Large acute hemorrhage centered in the region of the right caudate nucleus with rupture into the lateral ventricle and evidence of developing hydrocephalus and trapping of the right lateral ventricle. There is a mild amount of midline shift to the left. Parenchymal hemorrhage measures roughly 3.2 x 4.6 x 3.0 cm. This is most likely a hypertensive parenchymal bleed.   2D Echocardiogram - Left ventricle: Systolic function was normal. The estimated ejection fraction was in the range of 55% to 60%. - Aortic valve:  There was mild regurgitation. - Mitral valve: There was mild to moderate regurgitation directed centrally. - Left atrium: The atrium was moderately dilated. - Right atrium: The atrium was moderately dilated. - Pulmonic valve: There was moderate regurgitation directed centrally. - Pulmonary arteries: Systolic pressure was mildly increased. PA peak pressure: 43 mm Hg (S).  EKG atrial fibrillation, rate 82. For complete results please see formal report.    HISTORY OF PRESENT ILLNES Nicole [name] is an 81 y.o. female history of atrial fibrillation on Coumadin, hypertension, hyperlipidemia, diabetes mellitus, CHF and chronic kidney disease, brought to the emergency room after being found on the floor home this morning. She was noted to be somewhat confused last night. She complained of feeling of wooziness and slight headache at 3:30 PM yesterday. She has no previous history of stroke nor TIA. CT scan of her head showed fairly large intracerebral hemorrhage involving the right caudate and extending into right ventricle with mass effect with mild midline shift as well as right ventricular enlargement. She has been evaluated by Dr. Saintclair Halsted of the Neurosurgery Service. Ventricular drain is not felt to be warranted at this time. Cerebral hematoma not considered operable. Patient is being admitted to neuro intensive care unit for further management, including immediate coagulation reversal. INR was 3.6. Patient's blood pressure was elevated at 166/103. She was given labetalol IV for immediate management.  LSN: 3:30 PM on 04/03/2015 tPA Given: No: Acute ICH mRankin:  HOSPITAL COURSE ASSESSMENT/PLAN  [name] is an 81 y.o. female with history of HTN, DM, CHF, HLD, afib on coumadin, and CKD admitted for right caudate head ICH with IVH. Symptoms stable.   ICH: Right caudate head with ventricular extension likely secondary to HTN And warfarin coagulopathy ( INR 3.6 on admission)  CT  repeat x 3 showed stable hematoma and obstructive right lateral ventriculomegaly   Drowsiness likely due to cerebral edema  2D Echo EF 55-60%  LDL 97  HgbA1c 5.9  Heparin subcutaneous for VTE prophylaxis  Did pass swallow eval -dyspagia 3 diet with thin liquids  warfarin prior to admission, now on no antithrombotic due to [place]  Ongoing aggressive stroke risk factor management  Therapy recommendations: pending  Disposition: Pending  Obstructive hydrocephalus  Right mild ventriculomagely  No indication for intervention now  Repeat CT head stable hematoma and hydrocephalus  Afib on coumadin at home  Still has afib rhythm on tele  On cardizam and metoprolol at home  Hold off coumadin  Rate controlled  Disturbed sleep wake cycle  Pt did not sleep much at night  Drowsy sleep during the day  Did not pass swallow today  NPO except meds   Hold off NG tube for now  Close monitoring  Sleep hygiene (turn on light, TV and perform activities during the day, turn off light, TV and quiet time at night)  Diabetes  HgbA1c 5.9 goal < 7.0  Controlled  CBG monitoring  SSI  Hypertension, accelerated  Home meds: cardizem and metoprolol  BP goal < 160.  Resume po meds  Taper off cleviprix if able  Hyperlipidemia  Home meds: zocor 40   Currently on none due to [place]  LDL 97, goal < 70  Resume statin at discharge  CKD, stage III  Monitor Cre 1.32 -> 1.19  Avoid nephrotoxic agent  Continue IVF for now  Other Stroke Risk Factors  Advanced age  Other Active Problems  Mild anemia  hypokalemia  DISCHARGE EXAM Blood pressure 161/94, pulse 74, temperature 98.7 F (37.1 C), temperature source Oral, resp. rate 20, height 5\' 8"  (1.727 m), weight 80.7 kg (177 lb 14.6 oz), SpO2 98 %.  General - Well nourished, well developed elderly african american aldy not in distress.,   Ophthalmologic - fundi not visualized due to  noncooperation.  Cardiovascular - irregularly irregular heart rate and rhythm, tachycardia.  Mental Status -  Level of arousal and orientation to place and people was intact, but not oriented to time or age. Language exam showed paucity of speech, but able to name and repeat and following simple commands.  Cranial Nerves II - XII - II - Visual field intact OU. III, IV, VI - Extraocular movements intact. V - Facial sensation intact bilaterally. VII - right nasolabial fold mild flattening. VIII - Hearing & vestibular intact bilaterally. X - Palate elevates symmetrically. XI - Chin turning & shoulder shrug intact bilaterally. XII - Tongue protrusion intact.  Motor Strength - The patient's strength was 4/5 in all extremities and pronator drift was absent. Bulk was normal and fasciculations were absent.  Motor Tone - Muscle tone was assessed at the neck and appendages and was normal.  Reflexes - The patient's reflexes were symmetrical in all extremities and she had no pathological reflexes.  Sensory - Light touch, temperature/pinprick were assessed and were symmetrical.   Coordination - no significant ataxia bilaterally. Tremor was absent.  Gait and Station - deferred due to safety concerns.  Discharge Diet  DIET DYS 3 Room service appropriate?: Yes; Fluid consistency:: Thin liquids  DISCHARGE PLAN  Disposition:  Transfer to [place] for ongoing PT, OT and ST  no antithrombotic for secondary stroke prevention secondary to intracerebral hemorrhage  Recommend ongoing risk factor control by Primary Care Physician at time of discharge from inpatient rehabilitation.  Follow-up [name], [name], DO in 2 weeks following discharge from rehab.  Follow-up wiith Dr. Antony Contras, Stroke Clinic in 2 months.   35 minutes were spent preparing discharge.  Mikey Bussing PA-C [facility] Pager [phone] 04/12/2015, 5:27 PM I have personally examined  this patient, reviewed notes, independently viewed imaging studies, participated in  medical decision making and plan of care. I have made any additions or clarifications directly to the above note. Agree with note above.   Antony Contras, MD Medical Director [facility] Pager: [phone] 04/12/2015 5:56 PM

## 2015-04-12 NOTE — Progress Notes (Signed)
Physical Therapy Treatment Patient Details Name: Nicole [name] MRN: [id number] DOB: 1930/08/23 Today's Date: 04/12/2015    History of Present Illness Nicole [name] is an 81 y.o. female history of atrial fibrillation on Coumadin, hypertension, hyperlipidemia, diabetes mellitus, CHF and chronic kidney disease, brought to the emergency room after being found on the floor home. CT scan of her head showed fairly large intracerebral hemorrhage involving the right caudate and extending into right ventricle with mass effect with mild midline shift as well as right ventricular enlargement. evaluated by Dr. Saintclair Halsted of the Neurosurgery Service. Ventricular drain is not felt to be warranted at this time.    PT Comments    Pt con't to demo extremely slow processing and sequencing limiting ambulation tolerance. Pt with with noted L LE weakness and impaired balance as well. Con't to recommend CIR to address these deficits for maximal functional recovery for safe transition home.  Follow Up Recommendations  CIR;Supervision/Assistance - 24 hour     Equipment Recommendations  Rolling walker with 5" wheels    Recommendations for Other Services Rehab consult     Precautions / Restrictions Precautions Precautions: Fall    Mobility  Bed Mobility Overal bed mobility: Needs Assistance             General bed mobility comments: pt up in chair  Transfers Overall transfer level: Needs assistance Equipment used: Rolling walker (2 wheeled) Transfers: Sit to/from Stand Sit to Stand: Mod assist         General transfer comment: max directional verbal and tactile cues for anterior weight shift and to push up from chair  Ambulation/Gait Ambulation/Gait assistance: Mod assist;+2 physical assistance;+2 safety/equipment Ambulation Distance (Feet): 50 Feet (x2) Assistive device: Rolling walker (2 wheeled);2 person hand held assist Gait Pattern/deviations: Step-through pattern;Decreased stride  length;Shuffle Gait velocity: extremely slow Gait velocity interpretation: Below normal speed for age/gender General Gait Details: trialed both with RW and bilat HHA. pt required max tactile cues to advance L LE and to clear foot. pt with improved sequencing when ambulating with bilat HHA most likely due to PT and Tech maintaining momentum and pt picking up habitual pattern   Stairs            Wheelchair Mobility    Modified Rankin (Stroke Patients Only) Modified Rankin (Stroke Patients Only) Pre-Morbid Rankin Score: No symptoms Modified Rankin: Moderately severe disability     Balance Overall balance assessment: Needs assistance         Standing balance support: Bilateral upper extremity supported Standing balance-Leahy Scale: Poor                      Cognition Arousal/Alertness: Lethargic Behavior During Therapy: Flat affect Overall Cognitive Status: Impaired/Different from baseline Area of Impairment: Attention;Memory;Problem solving;Following commands;Safety/judgement Orientation Level: Disoriented to;Place Current Attention Level: Sustained Memory: Decreased short-term memory Following Commands: Follows one step commands with increased time;Follows one step commands inconsistently Safety/Judgement: Decreased awareness of deficits Awareness: Intellectual Problem Solving: Slow processing;Decreased initiation;Difficulty sequencing;Requires verbal cues;Requires tactile cues General Comments: pt very flat without initiation of any mvmt requiring max tactile cues to complete task    Exercises      General Comments        Pertinent Vitals/Pain Pain Assessment: No/denies pain    Home Living                      Prior Function  PT Goals (current goals can now be found in the care plan section) Progress towards PT goals: Progressing toward goals    Frequency  Min 4X/week    PT Plan Current plan remains appropriate;Frequency  needs to be updated    Co-evaluation             End of Session Equipment Utilized During Treatment: Gait belt Activity Tolerance: Patient tolerated treatment well Patient left: in chair;with call bell/phone within reach;with chair alarm set (with SLP)     Time: 8377-9396 PT Time Calculation (min) (ACUTE ONLY): 26 min  Charges:  $Gait Training: 23-37 mins                    G Codes:      Kingsley Callander 04/12/2015, 10:37 AM   Kittie Plater, PT, DPT Pager #: [phone] Office #: [phone]

## 2015-04-12 NOTE — Progress Notes (Signed)
Physical Medicine and Rehabilitation Consult Reason for Consult: Intracerebral hemorrhage Referring Physician: Dr.Xu   HPI: Nicole [name] is a 81 y.o. right handed female with history of hypertension, systolic congestive heart failure, diabetes mellitus peripheral neuropathy, chronic renal insufficiency with baseline creatinine 1.35, atrial fibrillation on chronic Coumadin. Patient independent prior to admission still driving use a single-point cane living alone. Presented 04/04/2015 with altered mental status as well as headache. Blood pressure 166/103. INR 3.6. CT of the head showed a large acute hemorrhage centered in the region of the right caudate nucleus with rupture into the lateral ventricle and evidence of developing hydrocephalus. Parenchymal hemorrhage measuring roughly 3.2 x 4.6 x 3.0 cm. Neurosurgery Dr. Saintclair Halsted consulted advise conservative care. Coumadin was reversed. Follow-up serial cranial CT scans 04/07/2015 showing evolving right basal ganglia hematoma with intraventricular extension. Echocardiogram with ejection fraction of 37% normal systolic function. Maintain on a regular consistency diet. Physical therapy evaluation completed 04/06/2015 with recommendations of physical medicine rehabilitation consult.   Review of Systems  Constitutional: Negative for fever and chills.  HENT: Negative for hearing loss.  Eyes: Positive for blurred vision. Negative for double vision.  Respiratory: Negative for cough.   Shortness of breath with exertion  Cardiovascular: Positive for palpitations and leg swelling. Negative for chest pain.  Gastrointestinal: Positive for constipation. Negative for nausea, vomiting and abdominal pain.   GERD  Genitourinary: Negative for dysuria and hematuria.  Musculoskeletal: Positive for myalgias and joint pain.  Neurological: Positive for dizziness and headaches. Negative for loss of consciousness.  Psychiatric/Behavioral:   Anxiety    Past Medical History  Diagnosis Date  . Colon cancer 07/1997  . Hypertension   . Diabetes mellitus   . CHF (congestive heart failure)   . AAA (abdominal aortic aneurysm)   . Atrial fibrillation   . Hypercholesteremia   . Kidney stone     renal calculi  . Small bowel obstruction due to adhesions 05/16/2012  . Hypothyroidism   . Arthritis   . Anemia   . Cholelithiasis   . Perforation of bile duct   . Hemorrhage of rectum and anus   . Swelling, mass, or lump in head and neck   . Cervicalgia   . Hypoglycemia, unspecified   . Pain in joint, lower leg   . Anxiety   . Shortness of breath   . Acute upper respiratory infections of unspecified site   . Depression   . Thoracic aneurysm without mention of rupture   . GERD (gastroesophageal reflux disease)   . Proteinuria   . Other specified cardiac dysrhythmias(427.89)   . Congestive heart failure, unspecified   . Electrolyte and fluid disorders not elsewhere classified   . Other chronic allergic conjunctivitis   . Hypopotassemia   . Chronic systolic heart failure   . Dizziness and giddiness   . Malignant neoplasm of colon, unspecified site   . Other and unspecified hyperlipidemia   . Gout, unspecified   . Atrial fibrillation   . Aortic aneurysm of unspecified site without mention of rupture   . Pain in joint, site unspecified   . Palpitations   . CKD (chronic kidney disease)   . Diabetes mellitus    Past Surgical History  Procedure Laterality Date  . Hemicolectomy  08/11/1997  . Kidney stone surgery    . Colon surgery      to remove colon ca  . Cholecystectomy  05/14/2012    Procedure: LAPAROSCOPIC CHOLECYSTECTOMY WITH INTRAOPERATIVE CHOLANGIOGRAM; Surgeon: Haywood Lasso, MD; Location:  [facility]; Service: General; Laterality: N/A;  . Ercp  05/17/2012     Procedure: ENDOSCOPIC RETROGRADE CHOLANGIOPANCREATOGRAPHY (ERCP); Surgeon: Missy Sabins, MD; Location: [facility]; Service: Gastroenterology; Laterality: N/A;  . Ercp  08/12/2012    Procedure: ENDOSCOPIC RETROGRADE CHOLANGIOPANCREATOGRAPHY (ERCP); Surgeon: Missy Sabins, MD; Location: [facility]COPY; Service: Endoscopy; Laterality: N/A;  . Transthoracic echocardiogram  11/2009    EF 45-50%, mild-mod AV regurg; mild MR; mod TR; ascending aorta mildly dilated  . Cardiac catheterization  2008    moderate severe pulm HTN; with elevted pulm capillary wedge pressure    Family History  Problem Relation Age of Onset  . Heart disease Mother   . Stroke Father   . Hypertension Daughter   . Hypertension Son   . Diabetes Sister   . Hypertension Son    Social History:  reports that she has never smoked. She has never used smokeless tobacco. She reports that she does not drink alcohol or use illicit drugs. Allergies:  Allergies  Allergen Reactions  . Iohexol     Code: VOM, Desc: PT REPORTS VOMITING W/ IVP DYE- ARS 12/26/08, Onset Date: [id number]   . Z-Pak [Azithromycin]   . Iodinated Diagnostic Agents Nausea Only    PT HAS HAD SINGULAR INCIDENT OF NAUSEA W/ IV CONTYRAST INJECTION, NONE IF INJECTED SLOWLY//A.CALHOUN   Medications Prior to Admission  Medication Sig Dispense Refill  . allopurinol (ZYLOPRIM) 100 MG tablet Take 1 tablet (100 mg total) by mouth daily. 30 tablet 3  . bacitracin ointment Apply 1 application topically 2 (two) times daily. (Patient taking differently: Apply 1 application topically as needed for wound care. ) 120 g 0  . diltiazem (CARDIZEM CD) 240 MG 24 hr capsule take 1 capsule by mouth once daily 30 capsule 9  . furosemide (LASIX) 40 MG tablet take 1 tablet by mouth twice a day (Patient taking differently: take 1 tablet by mouth once a day) 60 tablet 11  . glipiZIDE (GLUCOTROL) 10 MG  tablet Take one tablet by mouth once daily to control blood sugar (Patient taking differently: Take 10 mg by mouth daily before breakfast. Take one tablet by mouth once daily to control blood sugar) 30 tablet 6  . levothyroxine (SYNTHROID, LEVOTHROID) 50 MCG tablet Take one tablet by mouth 30 minutes before breakfast once daily for thyroid (Patient taking differently: Take 50 mcg by mouth daily before breakfast. Take one tablet by mouth 30 minutes before breakfast once daily for thyroid) 90 tablet 3  . magnesium oxide (MAG-OX) 400 (241.3 MG) MG tablet Take 400 mg by mouth daily.  0  . metFORMIN (GLUCOPHAGE) 500 MG tablet Take 1 tablet (500 mg total) by mouth 2 (two) times daily with a meal. 60 tablet 5  . Multiple Vitamins-Minerals (WOMENS BONE HEALTH PO) Take 1 tablet by mouth daily.    . polyethylene glycol (MIRALAX / GLYCOLAX) packet Take 17 g by mouth daily as needed for mild constipation.     . potassium chloride SA (K-DUR,KLOR-CON) 20 MEQ tablet Take one tablet by mouth once daily for potassium (Patient taking differently: Take 20 mEq by mouth daily. Take one tablet by mouth once daily for potassium) 30 tablet 5  . simvastatin (ZOCOR) 40 MG tablet Take one tablet by mouth once daily for cholesterol (Patient taking differently: Take 40 mg by mouth daily. Take one tablet by mouth once daily for cholesterol) 30 tablet 3  . warfarin (COUMADIN) 5 MG tablet take 1 tablet by mouth once daily or as directed (  Patient taking differently: Take 2.5 mg by mouth daily on Sun, Tues, Thurs, and Sat. Take 5 mg by mouth daily on Mon, Wed, and Fri.) 30 tablet 3  . metoprolol (LOPRESSOR) 25 MG tablet Take 1 tablet (25 mg total) by mouth 2 (two) times daily. (Patient not taking: Reported on 04/04/2015) 60 tablet 3    Home: [place] expects to be discharged to:: Private residence Living Arrangements: Alone Available Help at Discharge: Family,  Available 24 hours/day Type of Home: House Home Access: Stairs to enter Technical brewer of Steps: 2 Entrance Stairs-Rails: Right Home Layout: One level Bathroom Shower/Tub: Walk-in shower Home Equipment: [place] - single point Additional Comments: pt drove and generally did for herself  Functional History: Prior Function Level of Independence: Independent Functional Status:  Mobility: Bed Mobility Overal bed mobility: Needs Assistance Bed Mobility: Supine to Sit Supine to sit: Min assist General bed mobility comments: cues for direction and alternative ways to scoot and extra time. Transfers Overall transfer level: Needs assistance Transfers: Sit to/from Stand, Stand Pivot Transfers Sit to Stand: Min assist Stand pivot transfers: Min assist General transfer comment: pt supports herself on the bed rail and is stiff, and guarded with transfer Ambulation/Gait Ambulation/Gait assistance: Min assist Ambulation Distance (Feet): 90 Feet Assistive device: 1 person hand held assist Gait Pattern/deviations: Step-through pattern, Decreased step length - right, Decreased step length - left, Decreased stride length, Narrow base of support General Gait Details: unsteady gait with small, tentative steps, higher guard postition with R UE. Not scanning her environment Gait velocity interpretation: Below normal speed for age/gender    ADL:    Cognition: Cognition Overall Cognitive Status: Within Functional Limits for tasks assessed Orientation Level: Oriented to person, Disoriented to time, Oriented to place, Oriented to situation Cognition Arousal/Alertness: Lethargic Behavior During Therapy: Flat affect Overall Cognitive Status: Within Functional Limits for tasks assessed  Blood pressure 156/87, pulse 63, temperature 98.8 F (37.1 C), temperature source Oral, resp. rate 15, height 5\' 8"  (1.727 m), weight 76 kg (167 lb 8.8 oz), SpO2 92 %. Physical Exam  HENT:  Head:  Normocephalic.  Eyes:  Pupils sluggish but reactive to light  Neck: Normal range of motion. Neck supple. No thyromegaly present.  Cardiovascular:  Irregular irregular  Respiratory: Effort normal and breath sounds normal. No respiratory distress.  GI: Soft. Bowel sounds are normal. She exhibits no distension.  Neurological: She is alert.  Mood is flat but appropriate. She makes good eye contact with examiner. Follows simple commands. Fair awareness of her deficits.  Skin: Skin is warm and dry.  Motor strength is 3 minus in the left deltoid, biceps, triceps, grip 3 minus in the left hip flexor knee extensor 2 minus in the left ankle does flex plantar flexor 4/5 Right deltoid, biceps, triceps, grip, hip flexor, knee extensor, ankle dorsi flexor and plantar flexor Reduced sensation to touch bilateral feet and bilateral hands Decreased visual fields to the left, difficult to assess for neglect secondary to mental status, poor attention concentration  Lab Results Last 24 Hours    Results for orders placed or performed during the hospital encounter of 04/04/15 (from the past 24 hour(s))  Urinalysis with microscopic Status: Abnormal   Collection Time: 04/06/15 11:40 AM  Result Value Ref Range   Color, Urine YELLOW YELLOW   APPearance CLEAR CLEAR   Specific Gravity, Urine 1.013 1.005 - 1.030   pH 6.5 5.0 - 8.0   Glucose, UA 100 (A) NEGATIVE mg/dL   Hgb urine  dipstick TRACE (A) NEGATIVE   Bilirubin Urine NEGATIVE NEGATIVE   Ketones, ur NEGATIVE NEGATIVE mg/dL   Protein, ur 100 (A) NEGATIVE mg/dL   Urobilinogen, UA 1.0 0.0 - 1.0 mg/dL   Nitrite NEGATIVE NEGATIVE   Leukocytes, UA NEGATIVE NEGATIVE   WBC, UA 7-10 <3 WBC/hpf   RBC / HPF 3-6 <3 RBC/hpf   Bacteria, UA FEW (A) RARE   Squamous Epithelial / LPF FEW (A) RARE  Glucose, capillary Status: Abnormal   Collection Time: 04/06/15 11:58 AM  Result Value Ref  Range   Glucose-Capillary 175 (H) 65 - 99 mg/dL  Glucose, capillary Status: Abnormal   Collection Time: 04/06/15 10:13 PM  Result Value Ref Range   Glucose-Capillary 163 (H) 65 - 99 mg/dL  CBC Status: Abnormal   Collection Time: 04/07/15 2:50 AM  Result Value Ref Range   WBC 9.4 4.0 - 10.5 K/uL   RBC 4.59 3.87 - 5.11 MIL/uL   Hemoglobin 11.8 (L) 12.0 - 15.0 g/dL   HCT 35.2 (L) 36.0 - 46.0 %   MCV 76.7 (L) 78.0 - 100.0 fL   MCH 25.7 (L) 26.0 - 34.0 pg   MCHC 33.5 30.0 - 36.0 g/dL   RDW 17.1 (H) 11.5 - 15.5 %   Platelets 268 150 - 400 K/uL  Basic metabolic panel Status: Abnormal   Collection Time: 04/07/15 2:50 AM  Result Value Ref Range   Sodium 140 135 - 145 mmol/L   Potassium 3.2 (L) 3.5 - 5.1 mmol/L   Chloride 106 101 - 111 mmol/L   CO2 23 22 - 32 mmol/L   Glucose, Bld 148 (H) 65 - 99 mg/dL   BUN 9 6 - 20 mg/dL   Creatinine, Ser 1.19 (H) 0.44 - 1.00 mg/dL   Calcium 8.9 8.9 - 10.3 mg/dL   GFR calc non Af Amer 41 (L) >60 mL/min   GFR calc Af Amer 48 (L) >60 mL/min   Anion gap 11 5 - 15      Imaging Results (Last 48 hours)    Ct Head Wo Contrast  04/07/2015 CLINICAL DATA: Follow-up intracranial hemorrhage. History of colon cancer, diabetes, atrial fibrillation. EXAM: CT HEAD WITHOUT CONTRAST TECHNIQUE: Contiguous axial images were obtained from the base of the skull through the vertex without intravenous contrast. COMPARISON: CT head April 05, 2015 FINDINGS: Similar appearance of 3.8 x 3 cm RIGHT basal ganglia intraparenchymal hemorrhage with intraventricular extension, dependent blood products in the occipital horns. Mass effect on the third ventricle with mild RIGHT ventricular entrapment. Small amount of probable redistributed subarachnoid blood within the RIGHT temporal occipital sulci, sylvian fissures. No acute large vascular territory infarcts.  Vasogenic edema RIGHT frontal lobe, in a background of at least moderate white matter changes compatible with chronic small vessel ischemic disease. Moderate calcific atherosclerosis of the carotid siphons. Basal cisterns are patent. Status post bilateral ocular lens implants. Lobulated mucosal thickening sphenoid sinuses. No skull fracture. Severe atlantodental osteoarthrosis partially characterized. IMPRESSION: Evolving RIGHT basal ganglia hematoma with intraventricular extension. Mild RIGHT ventricular entrapment. Increasing small to moderate amount of subarachnoid blood products are likely re- distributed. Electronically Signed By: Elon Alas M.D. On: 04/07/2015 05:32     Assessment/Plan: Diagnosis: Right caudate intracerebral hemorrhage with left hemiparesis and left field cut 1. Does the need for close, 24 hr/day medical supervision in concert with the patient's rehab needs make it unreasonable for this patient to be served in a less intensive setting? Yes 2. Co-Morbidities requiring supervision/potential complications: Diabetes with neuropathy, renal sufficiency  3. Due to bladder management, bowel management, safety, skin/wound care, disease management, medication administration, pain management and patient education, does the patient require 24 hr/day rehab nursing? Yes 4. Does the patient require coordinated care of a physician, rehab nurse, PT (1-2 hrs/day, 5 days/week), OT (1-2 hrs/day, 5 days/week) and SLP (.5-1 hrs/day, 5 days/week) to address physical and functional deficits in the context of the above medical diagnosis(es)? Yes Addressing deficits in the following areas: balance, endurance, locomotion, strength, transferring, bowel/bladder control, bathing, dressing, feeding, grooming, toileting, cognition, speech, language, swallowing and psychosocial support 5. Can the patient actively participate in an intensive therapy program of at least 3 hrs of therapy per day at  least 5 days per week? Yes 6. The potential for patient to make measurable gains while on inpatient rehab is excellent 7. Anticipated functional outcomes upon discharge from inpatient rehab are supervision with PT, supervision with OT, supervision with SLP. 8. Estimated rehab length of stay to reach the above functional goals is: 18-22d 9. Does the patient have adequate social supports and living environment to accommodate these discharge functional goals? Yes 10. Anticipated D/C setting: Home 11. Anticipated post D/C treatments: [place] therapy 12. Overall Rehab/Functional Prognosis: excellent  RECOMMENDATIONS: This patient's condition is appropriate for continued rehabilitative care in the following setting: CIR Patient has agreed to participate in recommended program. N/A Note that insurance prior authorization may be required for reimbursement for recommended care.  Comment:     04/07/2015       Revision History     Date/Time User Provider Type Action   04/07/2015 4:13 PM Charlett Blake, MD Physician Sign   04/07/2015 11:00 AM Cathlyn Parsons, PA-C Physician Assistant P[place]   View Details Report       Routing History     Date/Time From To Method   04/07/2015 4:13 PM Charlett Blake, MD Charlett Blake, MD In Basket   04/07/2015 4:13 PM Charlett Blake, MD [name], DO In [place]

## 2015-04-12 NOTE — Progress Notes (Signed)
STROKE TEAM PROGRESS NOTE   SUBJECTIVE (INTERVAL HISTORY) No family is at the bedside. She is more awake today, blood pressure is better.   Repeat CT 04/08/15 stable.     OBJECTIVE Temp:  [98.4 F (36.9 C)-99.2 F (37.3 C)] 98.7 F (37.1 C) (09/20 1317) Pulse Rate:  [60-85] 74 (09/20 1317) Cardiac Rhythm:  [-] Atrial fibrillation (09/20 0741) Resp:  [12-23] 20 (09/20 1317) BP: (158-179)/(76-100) 161/94 mmHg (09/20 1317) SpO2:  [95 %-100 %] 98 % (09/20 1317)   Recent Labs Lab 04/11/15 1136 04/11/15 1607 04/11/15 2221 04/12/15 0637 04/12/15 1130  GLUCAP 167* 122* 122* 128* 116*    Recent Labs Lab 04/06/15 0217 04/07/15 0250 04/08/15 0359 04/09/15 0229 04/10/15 0237  NA 143 140 138 136 139  K 3.2* 3.2* 4.3 3.3* 4.0  CL 111 106 109 103 106  CO2 24 23 17* 20* 21*  GLUCOSE 130* 148* 138* 143* 134*  BUN 11 9 10 13 20   CREATININE 1.32* 1.19* 1.11* 0.97 1.16*  CALCIUM 8.9 8.9 9.0 9.0 9.5   No results for input(s): AST, ALT, ALKPHOS, BILITOT, PROT, ALBUMIN in the last 168 hours.  Recent Labs Lab 04/06/15 0217 04/07/15 0250 04/08/15 0359 04/09/15 0229 04/10/15 0237  WBC 6.9 9.4 9.9 10.8* 15.2*  HGB 11.6* 11.8* 13.1 12.3 13.2  HCT 34.0* 35.2* 36.5 35.7* 37.5  MCV 76.6* 76.7* 75.7* 76.6* 77.5*  PLT 270 268 PLATELET CLUMPS NOTED ON SMEAR, UNABLE TO ESTIMATE 276 282   No results for input(s): CKTOTAL, CKMB, CKMBINDEX, TROPONINI in the last 168 hours. No results for input(s): LABPROT, INR in the last 72 hours. No results for input(s): COLORURINE, LABSPEC, [place], GLUCOSEU, HGBUR, BILIRUBINUR, KETONESUR, PROTEINUR, UROBILINOGEN, NITRITE, LEUKOCYTESUR in the last 72 hours.  Invalid input(s): APPERANCEUR     Component Value Date/Time   CHOL 161 04/05/2015 0819   CHOL 176 02/17/2015 0810   TRIG 104 04/05/2015 0819   HDL 43 04/05/2015 0819   HDL 70 02/17/2015 0810   CHOLHDL 3.7 04/05/2015 0819   CHOLHDL 2.5 02/17/2015 0810   VLDL 21 04/05/2015 0819   LDLCALC 97  04/05/2015 0819   LDLCALC 92 02/17/2015 0810   Lab Results  Component Value Date   HGBA1C 5.9* 04/05/2015   No results found for: LABOPIA, COCAINSCRNUR, LABBENZ, AMPHETMU, THCU, LABBARB  No results for input(s): ETH in the last 168 hours.  I have personally reviewed the radiological images below and agree with the radiology interpretations.  Dg Chest 2 View  04/04/2015    IMPRESSION: Stable mild cardiac enlargement.  No active disease.     Ct Head Wo Contrast  04/08/2015   IMPRESSION: 1. Unchanged right basal ganglia hematoma with intraventricular extension and small subarachnoid blood. 2. Stable mild ventriculomegaly.    04/07/2015   IMPRESSION: Evolving RIGHT basal ganglia hematoma with intraventricular extension. Mild RIGHT ventricular entrapment.  Increasing small to moderate amount of subarachnoid blood products are likely re- distributed.     04/05/2015   IMPRESSION: Size stable ~ 4 cm right basal ganglia hematoma. Intraventricular hemorrhage has redistributed, accounting for newly seen trace subarachnoid blood. Obstructive right lateral ventriculomegaly which is stable.    04/04/2015   IMPRESSION: Large acute hemorrhage centered in the region of the right caudate nucleus with rupture into the lateral ventricle and evidence of developing hydrocephalus and trapping of the right lateral ventricle. There is a mild amount of midline shift to the left. Parenchymal hemorrhage measures roughly 3.2 x 4.6 x 3.0 cm. This is most  likely a hypertensive parenchymal bleed.    2D Echocardiogram  - Left ventricle: Systolic function was normal. The estimated ejection fraction was in the range of 55% to 60%. - Aortic valve: There was mild regurgitation. - Mitral valve: There was mild to moderate regurgitation directed centrally. - Left atrium: The atrium was moderately dilated. - Right atrium: The atrium was moderately dilated. - Pulmonic valve: There was moderate regurgitation  directed centrally. - Pulmonary arteries: Systolic pressure was mildly increased. PA peak pressure: 43 mm Hg (S).  EKG  atrial fibrillation, rate 82. For complete results please see formal report.  PHYSICAL EXAM  Temp:  [98.4 F (36.9 C)-99.2 F (37.3 C)] 98.7 F (37.1 C) (09/20 1317) Pulse Rate:  [60-85] 74 (09/20 1317) Resp:  [12-23] 20 (09/20 1317) BP: (158-179)/(76-100) 161/94 mmHg (09/20 1317) SpO2:  [95 %-100 %] 98 % (09/20 1317)  General - Well nourished, well developed elderly african american aldy not in distress.,    Ophthalmologic - fundi not visualized due to noncooperation.  Cardiovascular - irregularly irregular heart rate and rhythm, tachycardia.  Mental Status -  Level of arousal and orientation to place and people was intact, but not oriented to time or age. Language exam showed paucity of speech, but able to name and repeat and following simple commands.  Cranial Nerves II - XII - II - Visual field intact OU. III, IV, VI - Extraocular movements intact. V - Facial sensation intact bilaterally. VII - right nasolabial fold mild flattening. VIII - Hearing & vestibular intact bilaterally. X - Palate elevates symmetrically. XI - Chin turning & shoulder shrug intact bilaterally. XII - Tongue protrusion intact.  Motor Strength - The patient's strength was 4/5 in all extremities and pronator drift was absent.  Bulk was normal and fasciculations were absent.   Motor Tone - Muscle tone was assessed at the neck and appendages and was normal.  Reflexes - The patient's reflexes were symmetrical in all extremities and she had no pathological reflexes.  Sensory - Light touch, temperature/pinprick were assessed and were symmetrical.    Coordination - no significant ataxia bilaterally.  Tremor was absent.  Gait and Station - deferred due to safety concerns.   ASSESSMENT/PLAN [name] is a 81 y.o. female with history of HTN, DM, CHF, HLD, afib on  coumadin, and CKD admitted for right caudate head ICH with IVH. Symptoms stable.    ICH:  Right caudate head with ventricular extension likely secondary to HTN  And warfarin coagulopathy ( INR 3.6 on admission)  CT repeat x 3 showed stable hematoma and obstructive right lateral ventriculomegaly   Drowsiness likely due to cerebral edema  2D Echo  EF 55-60%  LDL 97  HgbA1c 5.9  Heparin subcutaneous for VTE prophylaxis  Did  pass swallow  eval -dyspagia 3 diet with thin liquids   warfarin prior to admission, now on no antithrombotic due to [place]  Ongoing aggressive stroke risk factor management  Therapy recommendations:  pending  Disposition:  Pending  Obstructive hydrocephalus  Right mild ventriculomagely  No indication for intervention now  Repeat CT head stable hematoma and hydrocephalus  Afib on coumadin at home  Still has afib rhythm on tele  On cardizam and metoprolol at home  Hold off coumadin  Rate controlled  Disturbed sleep wake cycle  Pt did not sleep much at night  Drowsy sleep during the day  Did not pass swallow today  NPO except meds   Hold off NG tube for  now  Close monitoring  Sleep hygiene (turn on light, TV and perform activities during the day, turn off light, TV and quiet time at night)  Diabetes  HgbA1c 5.9 goal < 7.0  Controlled  CBG monitoring  SSI  Hypertension, accelerated  Home meds:   cardizem and metoprolol BP goal < 160. Resume po meds Taper off cleviprix if able  Hyperlipidemia  Home meds:  zocor 40   Currently on none due to [place]  LDL 97, goal < 70  Resume statin at discharge  CKD, stage III  Monitor Cre 1.32 -> 1.19  Avoid nephrotoxic agent  Continue IVF for now  Other Stroke Risk Factors  Advanced age  Other Active Problems  Mild anemia  hypokalemia  Other Pertinent History    Hospital day # 8 Plan     Transfer to rehab bed today .Start aspirin 81 mg daily 7-10 days post  ICH   Antony Contras, MD Stroke Neurology 04/12/2015 2:59 PM    To contact Stroke Continuity provider, please refer to [email or web address] After hours, contact General Neurology

## 2015-04-12 NOTE — Interval H&P Note (Signed)
Nicole [name] was admitted today to Inpatient Rehabilitation with the diagnosis of ICH.  The patient's history has been reviewed, patient examined, and there is no change in status.  Patient continues to be appropriate for intensive inpatient rehabilitation.  I have reviewed the patient's chart and labs.  Questions were answered to the patient's satisfaction. The PAPE has been reviewed and assessment remains appropriate.  SWARTZ,ZACHARY T 04/12/2015, 11:12 PM

## 2015-04-12 NOTE — Progress Notes (Signed)
I met with pt at bedside and discussed with Dr. Leonie Man. I have insurance approval and bed available to admit pt to inpt rehab today after I notify her sons which I discussed with yesterday. I will make the arrangements to admit pt later today. RN CM is aware. [phone]

## 2015-04-12 NOTE — Progress Notes (Signed)
PMR Admission Coordinator Pre-Admission Assessment  Patient: Nicole [name] is an 81 y.o., female MRN: [id number] DOB: 08/09/1930 Height: 5\' 8"  (172.7 cm) Weight: 80.7 kg (177 lb 14.6 oz)  Insurance Information HMO: yes PPO: PCP: IPA: 80/20: OTHER: medicare advantage plan PRIMARY: Bubba Hales Policy#: [id number] Subscriber: pt CM Name: Jenny Reichmann Phone#: [phone] Fax#: [facility] online approved for 7 days [place] to f/c [phone] EMR access Pre-Cert#: [id number] Employer: retired Benefits: Phone #: [phone] Name: 04/11/15 Eff. Date: 07/23/14 Deduct: none Out of Pocket Max: $4900 Life Max: none CIR: $345 per day days 1-5 then covers 100% SNF: no copay days 1-20; $160 per day days 21-51, no cpay days 52-100 Outpatient: $40 copay per visit Co-Pay: no visit limit Home Health: 100% Co-Pay: no visit limit DME: 80% Co-Pay: 20% Providers: in network  SECONDARY: none  Medicaid Application Date: Case Manager:  Disability Application Date: Case Worker:   Emergency Contact Information Contact Information    Name Relation Home Work Mobile   Whiteash Son Gower Son   [phone]   [name] Daughter [phone]  [phone]   Kirby Funk [phone]  [phone]     Current Medical History  Patient Admitting Diagnosis: right caudate ICH  History of Present Illness: Nicole [name] is a 81 y.o. right handed female with history of hypertension, systolic congestive heart failure, diabetes mellitus peripheral neuropathy, chronic renal insufficiency with baseline creatinine 1.35, atrial fibrillation on chronic Coumadin. Patient independent prior to admission still driving use a  single-point cane living alone. Presented 04/04/2015 with altered mental status as well as headache. Blood pressure 166/103. INR 3.6. CT of the head showed a large acute hemorrhage centered in the region of the right caudate nucleus with rupture into the lateral ventricle and evidence of developing hydrocephalus. Parenchymal hemorrhage measuring roughly 3.2 x 4.6 x 3.0 cm. Neurosurgery Dr. Saintclair Halsted consulted advise conservative care. Coumadin was reversed. Follow-up serial cranial CT scans 04/07/2015 showing evolving right basal ganglia hematoma with intraventricular extension. Echocardiogram with ejection fraction of 12% normal systolic function. Renal artery duplex with findings of 1-59 % renal artery stenosis and close monitoring of blood pressure was advised. Maintained on a mechanical soft diet.. Subcutaneous heparin added for DVT prophylaxis 04/08/2015. Physical and occupational therapy evaluations completed 04/06/2015 with recommendations of physical medicine rehabilitation consult. Patient was admitted for a comprehensive rehabilitation program  .Total: 5 NIH     Past Medical History  Past Medical History  Diagnosis Date  . Colon cancer 07/1997  . Hypertension   . Diabetes mellitus   . CHF (congestive heart failure)   . AAA (abdominal aortic aneurysm)   . Atrial fibrillation   . Hypercholesteremia   . Kidney stone     renal calculi  . Small bowel obstruction due to adhesions 05/16/2012  . Hypothyroidism   . Arthritis   . Anemia   . Cholelithiasis   . Perforation of bile duct   . Hemorrhage of rectum and anus   . Swelling, mass, or lump in head and neck   . Cervicalgia   . Hypoglycemia, unspecified   . Pain in joint, lower leg   . Anxiety   . Shortness of breath   . Acute upper respiratory infections of unspecified site   . Depression   . Thoracic aneurysm without mention of rupture   . GERD  (gastroesophageal reflux disease)   . Proteinuria   . Other specified cardiac dysrhythmias(427.89)   . Congestive heart failure, unspecified   .  Electrolyte and fluid disorders not elsewhere classified   . Other chronic allergic conjunctivitis   . Hypopotassemia   . Chronic systolic heart failure   . Dizziness and giddiness   . Malignant neoplasm of colon, unspecified site   . Other and unspecified hyperlipidemia   . Gout, unspecified   . Atrial fibrillation   . Aortic aneurysm of unspecified site without mention of rupture   . Pain in joint, site unspecified   . Palpitations   . CKD (chronic kidney disease)   . Diabetes mellitus     Family History  family history includes Diabetes in her sister; Heart disease in her mother; Hypertension in her daughter, son, and son; Stroke in her father.  Prior Rehab/Hospitalizations:  Has the patient had major surgery during 100 days prior to admission? No  Current Medications   Current facility-administered medications:  . 0.9 % sodium chloride infusion, , Intravenous, Continuous, Rosalin Hawking, MD, Last Rate: 75 mL/hr at 04/11/15 1914 . acetaminophen (TYLENOL) suppository 650 mg, 650 mg, Rectal, Q4H PRN, Greta Doom, MD, 650 mg at 04/09/15 2011 . acetaminophen (TYLENOL) tablet 650 mg, 650 mg, Oral, Q4H PRN, 650 mg at 04/09/15 1002 **OR** [DISCONTINUED] acetaminophen (TYLENOL) suppository 650 mg, 650 mg, Rectal, Q4H PRN, Wallie Char . allopurinol (ZYLOPRIM) tablet 100 mg, 100 mg, Oral, Daily, Rosalin Hawking, MD, 100 mg at 04/12/15 0854 . bisacodyl (DULCOLAX) suppository 10 mg, 10 mg, Rectal, Daily, Garvin Fila, MD, 10 mg at 04/12/15 0854 . cloNIDine (CATAPRES) tablet 0.2 mg, 0.2 mg, Oral, TID, Rosalin Hawking, MD, 0.2 mg at 04/12/15 0853 . diltiazem (CARDIZEM CD) 24 hr capsule 240 mg, 240 mg, Oral, Daily, Rosalin Hawking, MD, 240 mg at 04/12/15 0854 . feeding supplement (ENSURE  ENLIVE) (ENSURE ENLIVE) liquid 237 mL, 237 mL, Oral, BID BM, Heather C Pitts, RD, 237 mL at 04/12/15 1000 . feeding supplement (PIVOT 1.5 CAL) liquid 1,000 mL, 1,000 mL, Per Tube, Continuous, Wallie Char, Stopped at 04/11/15 0720 . heparin injection 5,000 Units, 5,000 Units, Subcutaneous, 3 times per day, Rosalin Hawking, MD, 5,000 Units at 04/12/15 [phone] . hydrALAZINE (APRESOLINE) injection 5-20 mg, 5-20 mg, Intravenous, Q4H PRN, Rosalin Hawking, MD, 20 mg at 04/10/15 2003 . insulin aspart (novoLOG) injection 0-15 Units, 0-15 Units, Subcutaneous, TID WC, Rosalin Hawking, MD, 2 Units at 04/12/15 0724 . labetalol (NORMODYNE,TRANDATE) injection 10-40 mg, 10-40 mg, Intravenous, Q10 min PRN, Garvin Fila, MD, 20 mg at 04/10/15 1652 . levothyroxine (SYNTHROID, LEVOTHROID) tablet 50 mcg, 50 mcg, Oral, QAC breakfast, Rosalin Hawking, MD, 50 mcg at 04/12/15 0854 . lisinopril (PRINIVIL,ZESTRIL) tablet 20 mg, 20 mg, Oral, BID, Rosalin Hawking, MD, 20 mg at 04/12/15 0853 . metoprolol tartrate (LOPRESSOR) tablet 25 mg, 25 mg, Oral, BID, Rosalin Hawking, MD, 25 mg at 04/12/15 0853 . pantoprazole (PROTONIX) EC tablet 40 mg, 40 mg, Oral, Daily, Rosalin Hawking, MD, 40 mg at 04/12/15 0853 . 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 None of the  above  Prior Functional Level Prior Function Level of Independence: Independent Comments: very active and Independent  Self Care: Did the patient need help bathing, dressing, using the toilet or eating? Independent  Indoor Mobility: Did the patient need assistance with walking from room to room (with or without device)? Independent  Stairs: Did the patient need assistance with internal or external stairs (with or without device)? Independent  Functional Cognition: Did the patient need help planning regular tasks such as shopping or remembering to take medications? Independent  Current Functional Level Cognition  Overall Cognitive Status: Impaired/Different from baseline Current Attention Level: Sustained Orientation Level: Oriented to person Following Commands: Follows one step commands with increased time, Follows one step commands inconsistently Safety/Judgement: Decreased awareness of deficits General Comments: pt very flat without initiation of any mvmt requiring max tactile cues to complete task   Extremity Assessment (includes Sensation/Coordination)  Upper Extremity Assessment: LUE deficits/detail LUE Deficits / Details: Able to use functionally, but generalized weakness LUE Coordination: decreased fine motor  Lower Extremity Assessment: Defer to PT evaluation    ADLs  Overall ADL's : Needs assistance/impaired Eating/Feeding: Set up Grooming: Supervision/safety, Set up Grooming Details (indicate cue type and reason): cues to attend and sequence Upper Body Bathing: Supervision/ safety, Set up, Sitting Upper Body Bathing Details (indicate cue type and reason): cues to complete tasks and attend Lower Body Bathing: Minimal assistance, Sit to/from stand Upper Body Dressing : Moderate assistance, Sitting Lower Body Dressing: Moderate assistance, Sit to/from stand Lower Body Dressing Details (indicate cue type and reason): trying to pull up her SCD hose as if they  were her socks. Kept taking sock off with cues to donn socks Toilet Transfer: Minimal assistance, Ambulation Toileting- Clothing Manipulation and Hygiene: Minimal assistance, Sit to/from stand Functional mobility during ADLs: Minimal assistance, +2 for safety/equipment, Cueing for sequencing, Cueing for safety (short shuffling gait pattern. ) General ADL Comments: difficulty with initiation of movement and attending to tasks to cmoplete    Mobility  Overal bed mobility: Needs Assistance Bed Mobility: Supine to Sit Supine to sit: HOB elevated, Mod assist General bed mobility comments: pt up in chair    Transfers  Overall transfer level: Needs assistance Equipment used: Rolling walker (2 wheeled) Transfers: Sit to/from Stand Sit to Stand: Mod assist Stand pivot transfers: Min assist General transfer comment: max directional verbal and tactile cues for anterior weight shift and to push up from chair    Ambulation / Gait / Stairs / Wheelchair Mobility  Ambulation/Gait Ambulation/Gait assistance: Mod assist, +2 physical assistance, +2 safety/equipment Ambulation Distance (Feet): 50 Feet (x2) Assistive device: Rolling walker (2 wheeled), 2 person hand held assist Gait Pattern/deviations: Step-through pattern, Decreased stride length, Shuffle General Gait Details: trialed both with RW and bilat HHA. pt required max tactile cues to advance L LE and to clear foot. pt with improved sequencing when ambulating with bilat HHA most likely due to PT and Tech maintaining momentum and pt picking up habitual pattern Gait velocity: extremely slow Gait velocity interpretation: Below normal speed for age/gender    Posture / Balance Dynamic Sitting Balance Sitting balance - Comments: minguard at least for balance at edge of bed Balance Overall balance assessment: Needs assistance Sitting-balance support: Feet supported, Bilateral upper extremity supported Sitting balance-Leahy Scale:  Poor Sitting balance - Comments: minguard at least for balance at edge of bed Postural control: Posterior lean Standing balance support: Bilateral upper  extremity supported Standing balance-Leahy Scale: Poor Standing balance comment: needs UE support but can balance with supervision momentarily    Special needs/care consideration Bowel mgmt: incontinent LBM 9/12 Bladder mgmt:incontinent Diabetic mgmt Hgb A1c 5.9   Previous Home Environment Living Arrangements: Alone Lives With: Alone Available Help at Discharge: Family, Available 24 hours/day Type of Home: House Home Layout: One level Home Access: Stairs to enter Entrance Stairs-Rails: Right Entrance Stairs-Number of Steps: 2 Bathroom Shower/Tub: Multimedia programmer: Handicapped height Bathroom Accessibility: Yes How Accessible: Accessible via walker [place]: No Additional Comments: had friend, [name] , of 11 yrs who does not live with her  Discharge Living Setting Plans for Discharge Living Setting: Patient's home, Alone Type of Home at Discharge: House Discharge Home Layout: One level Discharge Home Access: Stairs to enter Entrance Stairs-Rails: Right Entrance Stairs-Number of Steps: 2 Discharge Bathroom Shower/Tub: Walk-in shower Discharge Bathroom Toilet: Handicapped height Discharge Bathroom Accessibility: Yes How Accessible: Accessible via walker Does the patient have any problems obtaining your medications?: No  Social/Family/Support Systems Patient Roles: Spouse, Parent, Science writer Information: Tyrone or [name], sons most accessible Anticipated Caregiver: children and boyfriend to provide 24/7 supervision Anticipated Caregiver's Contact Information: see above Ability/Limitations of Caregiver: sons and family will work out caregivers Caregiver Availability: 24/7 Discharge Plan Discussed with Primary Caregiver: Yes Is Caregiver In Agreement with Plan?: Yes Does Caregiver/Family  have Issues with Lodging/Transportation while Pt is in Rehab?: No   Goals/Additional Needs Patient/Family Goal for Rehab: Supervision with PT, OT, and SLP Expected length of stay: ELOS 18-22 days Pt/Family Agrees to Admission and willing to participate: Yes Program Orientation Provided & Reviewed with Pt/Caregiver Including Roles & Responsibilities: Yes   Decrease burden of Care through IP rehab admission: n/a   Possible need for SNF placement upon discharge: n/a  Patient Condition: This patient's medical and functional status has changed since the consult dated: 04/07/2015 in which the Rehabilitation Physician determined and documented that the patient's condition is appropriate for intensive rehabilitative care in an inpatient rehabilitation facility. See "History of Present Illness" (above) for medical update. Functional changes are: mod assist. Patient's medical and functional status update has been discussed with the Rehabilitation physician and patient remains appropriate for inpatient rehabilitation. Will admit to inpatient rehab today.  Preadmission Screen Completed By: Cleatrice Burke, 04/12/2015 1:43 PM ______________________________________________________________________  Discussed status with Dr. Naaman Plummer on 04/12/15 at 1342 and received telephone approval for admission today.  Admission Coordinator: Teeghan, Hammer, time 6195 Date 04/12/2015          Cosigned by: Meredith Staggers, MD at 04/12/2015 1:59 PM  Revision History     Date/Time User Provider Type Action   04/12/2015 1:59 PM Meredith Staggers, MD Physician Cosign   04/12/2015 1:43 PM Cristina Gong, RN Rehab Admission Coordinator Sign

## 2015-04-13 ENCOUNTER — Inpatient Hospital Stay (HOSPITAL_COMMUNITY): Payer: Medicare Other | Admitting: Physical Therapy

## 2015-04-13 ENCOUNTER — Inpatient Hospital Stay (HOSPITAL_COMMUNITY): Payer: Medicare Other | Admitting: Occupational Therapy

## 2015-04-13 ENCOUNTER — Inpatient Hospital Stay (HOSPITAL_COMMUNITY): Payer: Medicare Other | Admitting: Speech Pathology

## 2015-04-13 LAB — CBC WITH DIFFERENTIAL/PLATELET
BASOS ABS: 0 10*3/uL (ref 0.0–0.1)
Basophils Relative: 0 %
EOS PCT: 0 %
Eosinophils Absolute: 0 10*3/uL (ref 0.0–0.7)
HCT: 34.2 % — ABNORMAL LOW (ref 36.0–46.0)
Hemoglobin: 11.7 g/dL — ABNORMAL LOW (ref 12.0–15.0)
LYMPHS PCT: 11 %
Lymphs Abs: 1 10*3/uL (ref 0.7–4.0)
MCH: 26.2 pg (ref 26.0–34.0)
MCHC: 34.2 g/dL (ref 30.0–36.0)
MCV: 76.5 fL — AB (ref 78.0–100.0)
Monocytes Absolute: 1 10*3/uL (ref 0.1–1.0)
Monocytes Relative: 11 %
Neutro Abs: 6.7 10*3/uL (ref 1.7–7.7)
Neutrophils Relative %: 78 %
PLATELETS: 297 10*3/uL (ref 150–400)
RBC: 4.47 MIL/uL (ref 3.87–5.11)
RDW: 17.1 % — ABNORMAL HIGH (ref 11.5–15.5)
WBC: 8.7 10*3/uL (ref 4.0–10.5)

## 2015-04-13 LAB — URINALYSIS, ROUTINE W REFLEX MICROSCOPIC
Glucose, UA: NEGATIVE mg/dL
Hgb urine dipstick: NEGATIVE
Ketones, ur: NEGATIVE mg/dL
Leukocytes, UA: NEGATIVE
NITRITE: NEGATIVE
PROTEIN: 100 mg/dL — AB
SPECIFIC GRAVITY, URINE: 1.021 (ref 1.005–1.030)
UROBILINOGEN UA: 2 mg/dL — AB (ref 0.0–1.0)
pH: 5.5 (ref 5.0–8.0)

## 2015-04-13 LAB — COMPREHENSIVE METABOLIC PANEL
ALBUMIN: 2.5 g/dL — AB (ref 3.5–5.0)
ALT: 8 U/L — AB (ref 14–54)
AST: 13 U/L — AB (ref 15–41)
Alkaline Phosphatase: 71 U/L (ref 38–126)
Anion gap: 8 (ref 5–15)
BUN: 17 mg/dL (ref 6–20)
CHLORIDE: 108 mmol/L (ref 101–111)
CO2: 23 mmol/L (ref 22–32)
CREATININE: 1.13 mg/dL — AB (ref 0.44–1.00)
Calcium: 8.9 mg/dL (ref 8.9–10.3)
GFR calc Af Amer: 51 mL/min — ABNORMAL LOW (ref >60)
GFR calc non Af Amer: 44 mL/min — ABNORMAL LOW (ref >60)
GLUCOSE: 133 mg/dL — AB (ref 65–99)
Potassium: 3.2 mmol/L — ABNORMAL LOW (ref 3.5–5.1)
SODIUM: 139 mmol/L (ref 135–145)
Total Bilirubin: 0.8 mg/dL (ref 0.3–1.2)
Total Protein: 6.4 g/dL — ABNORMAL LOW (ref 6.5–8.1)

## 2015-04-13 LAB — URINE MICROSCOPIC-ADD ON

## 2015-04-13 LAB — GLUCOSE, CAPILLARY
Glucose-Capillary: 123 mg/dL — ABNORMAL HIGH (ref 65–99)
Glucose-Capillary: 188 mg/dL — ABNORMAL HIGH (ref 65–99)
Glucose-Capillary: 135 mg/dL — ABNORMAL HIGH (ref 65–99)
Glucose-Capillary: 150 mg/dL — ABNORMAL HIGH (ref 65–99)

## 2015-04-13 MED ORDER — POTASSIUM CHLORIDE CRYS ER 10 MEQ PO TBCR
10.0000 meq | EXTENDED_RELEASE_TABLET | Freq: Two times a day (BID) | ORAL | Status: DC
  Administered 2015-04-13 – 2015-04-16 (×6): 10 meq via ORAL
  Filled 2015-04-13 (×8): qty 1

## 2015-04-13 NOTE — Progress Notes (Signed)
Social Work Assessment and Plan Social Work Assessment and Plan  Patient Details  Name: Nicole [name] MRN: [id number] Date of Birth: 1931-04-14  Today's Date: 04/13/2015  Problem List:  Patient Active Problem List   Diagnosis Date Noted  . Left hemiparesis   . Chronic diastolic congestive heart failure   . Persistent atrial fibrillation   . Type 2 diabetes mellitus with peripheral neuropathy   . ICH (intracerebral hemorrhage) 04/04/2015  . Anticoagulated on Coumadin   . Chronic atrial fibrillation   . Hypertensive urgency   . SOB (shortness of breath) 04/03/2014  . Epistaxis 02/15/2014  . SBO (small bowel obstruction) 01/23/2014  . Diabetic nephropathy 09/07/2013  . Chronic constipation 03/25/2013  . Carpal tunnel syndrome 02/25/2013  . Hypothyroidism   . Gout flare 06/01/2012  . Leakage of common bile duct s/p ERCP / stent 05/30/2012  . Ileus post op. still NPO 05/22/2012  . Bile leak, postoperative 05/17/2012  . Hypokalemia 05/17/2012  . Atrial fibrillation with RVR 05/16/2012  . Postoperative ileus 05/16/2012  . Small bowel obstruction due to adhesions 05/16/2012  . Acute on chronic renal insufficiency 05/15/2012  . Normal coronary arteries, 2008 05/15/2012  . Type II or unspecified type diabetes mellitus with neurological manifestations, not stated as uncontrolled 05/15/2012  . HTN (hypertension) 05/15/2012  . Respiratory distress post op secondary to rapid AF 05/15/2012  . ARF (acute renal failure) 05/15/2012  . Abdominal pain, acute, right upper quadrant 05/12/2012  . Nausea and vomiting 05/12/2012  . Dehydration 05/12/2012  . Cholecystitis chronic, acute, S/P lap cholecystectomy 05/14/12 05/12/2012  . Thoracic aortic aneurysm, 4.8cm 2011   . CHF, past NICM with an EF of 30%, most recent echo 2011 showed EF to be45- 50%   . Anemia 08/09/2011  . History of colon cancer, stage III 08/09/2011   Past Medical History:  Past Medical History  Diagnosis Date  .  Colon cancer 07/1997  . Hypertension   . Diabetes mellitus   . CHF (congestive heart failure)   . AAA (abdominal aortic aneurysm)   . Atrial fibrillation   . Hypercholesteremia   . Kidney stone     renal calculi  . Small bowel obstruction due to adhesions 05/16/2012  . Hypothyroidism   . Arthritis   . Anemia   . Cholelithiasis   . Perforation of bile duct   . Hemorrhage of rectum and anus   . Swelling, mass, or lump in head and neck   . Cervicalgia   . Hypoglycemia, unspecified   . Pain in joint, lower leg   . Anxiety   . Shortness of breath   . Acute upper respiratory infections of unspecified site   . Depression   . Thoracic aneurysm without mention of rupture   . GERD (gastroesophageal reflux disease)   . Proteinuria   . Other specified cardiac dysrhythmias(427.89)   . Congestive heart failure, unspecified   . Electrolyte and fluid disorders not elsewhere classified   . Other chronic allergic conjunctivitis   . Hypopotassemia   . Chronic systolic heart failure   . Dizziness and giddiness   . Malignant neoplasm of colon, unspecified site   . Other and unspecified hyperlipidemia   . Gout, unspecified   . Atrial fibrillation   . Aortic aneurysm of unspecified site without mention of rupture   . Pain in joint, site unspecified   . Palpitations   . CKD (chronic kidney disease)   . Diabetes mellitus    Past Surgical History:  Past Surgical History  Procedure Laterality Date  . Hemicolectomy  08/11/1997  . Kidney stone surgery    . Colon surgery      to remove colon ca  . Cholecystectomy  05/14/2012    Procedure: LAPAROSCOPIC CHOLECYSTECTOMY WITH INTRAOPERATIVE CHOLANGIOGRAM;  Surgeon: Haywood Lasso, MD;  Location: [facility];  Service: General;  Laterality: N/A;  . Ercp  05/17/2012    Procedure: ENDOSCOPIC RETROGRADE CHOLANGIOPANCREATOGRAPHY (ERCP);  Surgeon: Missy Sabins, MD;  Location: [facility];  Service: Gastroenterology;  Laterality: N/A;  . Ercp  08/12/2012     Procedure: ENDOSCOPIC RETROGRADE CHOLANGIOPANCREATOGRAPHY (ERCP);  Surgeon: Missy Sabins, MD;  Location: [facility] ENDOSCOPY;  Service: Endoscopy;  Laterality: N/A;  . Transthoracic echocardiogram  11/2009    EF 45-50%, mild-mod AV regurg; mild MR; mod TR; ascending aorta mildly dilated  . Cardiac catheterization  2008    moderate severe pulm HTN; with elevted pulm capillary wedge pressure    Social History:  reports that she has never smoked. She has never used smokeless tobacco. She reports that she does not drink alcohol or use illicit drugs.  Family / Support Systems Marital Status: Widow/Widower Patient Roles: Spouse, Parent, Volunteer Children: Molli Knock [phone]  [phone]   Tyrone-son [phone] Other Supports: Duncan-son [phone]  [phone]  [phone] Anticipated Caregiver: Children and friend-[place] Ability/Limitations of Caregiver: children work but will work on 24 hr care plan Caregiver Availability: Other (Comment) (Family working on 24 hr care-aware pt will need at discharge) Family Dynamics: Close knit family pt's three children are all local and very involved with pt.  They will do whatever is necessary,according to daughter. Pt has always been independent and taken care of herself and others, so this is different for all of them.  Social History Preferred language: English Religion: Methodist Cultural Background: No issues Education: [employer] Read: Yes Write: Yes Employment Status: Retired Freight forwarder Issues: No issues Guardian/Conservator: None-according to MD pt is capable of making her decisions while here   Abuse/Neglect Physical Abuse: Denies Verbal Abuse: Denies Sexual Abuse: Denies Exploitation of patient/patient's resources: Denies Self-Neglect: Denies  Emotional Status Pt's affect, behavior adn adjustment status: Pt is motivated to do well, she has alwasy been able to care for herself and wants to  get to this level again. She reports: " I know I have a ways to go."  She was living alone and active prior to this event. Recent Psychosocial Issues: Other health issues but were managed Pyschiatric History: History of anxiety has taken medicines in the past but was not on anything at this time. Deferred depression screen due to pt being somewhat confused this am.  Since it is her first day will continue to evaluate her cognition. May benefit from neuro-psych while here. Substance Abuse History: No issues  Patient / Family Perceptions, Expectations & Goals Pt/Family understanding of illness & functional limitations: Pt knows she had a stroke or was told but unsure if she relaizes her deficits. Her daughter can explain her stroke and has spoken with the MD and feels she has a basic understanding of deficits. Will make sure their questions are answered while here. Premorbid pt/family roles/activities: Mom, grandmother, retiree, Psychologist, occupational, bowler, church pianonist, etc Anticipated changes in roles/activities/participation: resume Pt/family expectations/goals: Pt states: " I would be better if my spirits were lifted, but will try my best."  Daughter states: " I hope she can recover and do for herself, but know she will need help with her care."  [employer]  Community Agencies: None Premorbid Home Care/DME Agencies: None Transportation available at discharge: Family Resource referrals recommended: Neuropsychology, Support group (specify)  Discharge Planning Living Arrangements: Alone Support Systems: Children, Water engineer, Social worker community Type of Residence: Private residence Insurance Resources: Multimedia programmer (specify) Primary school teacher) Financial Resources: Radio broadcast assistant Screen Referred: No Living Expenses: Own Money Management: Patient Does the patient have any problems obtaining your medications?: No Home Management: Self Patient/Family Preliminary Plans:  Family aware pt will require 24 hr care upon dishcarge from rehab, so are coming up with a plan. Pt does have [name] who is involved and has offered to help her. Will await team's evaluations and develop a safe discharge plan for pt. Will provide support to pt throughout her stay. Social Work Anticipated Follow Up Needs: HH/OP, Support Group, SNF  Clinical Impression Very pleasant female who was very active and independent for a 81 yo. She has good family and church supports. Children are aware she will need 24 hr care upon discharge from here and are beginning to work on this. Will await team's evaluations and come up with a safe discharge plan for pt. Pt may benefit from neuro-psych seeing while here.  Elease Hashimoto 04/13/2015, 2:50 PM

## 2015-04-13 NOTE — Plan of Care (Signed)
Problem: RH BLADDER ELIMINATION Goal: RH STG MANAGE BLADDER WITH ASSISTANCE STG Manage Bladder With Assistance. Mod A  Outcome: Not Progressing I/O cath being done

## 2015-04-13 NOTE — Evaluation (Signed)
Physical Therapy Assessment and Plan  Patient Details  Name: Nicole [name] MRN: [id number] Date of Birth: 1930/08/26  PT Diagnosis: Abnormality of gait, Cognitive deficits, Coordination disorder, Difficulty walking, Dizziness and giddiness, Hemiplegia non-dominant, Impaired cognition, Impaired sensation, Muscle weakness and Vertigo Rehab Potential: Good ELOS: 3 weeks   Today's Date: 04/13/2015 PT Individual Time: 0830-0930 Treatment Session 2: 1300-1330 PT Individual Time Calculation (min): 60 min  Treatment Session 2: 30 min  Problem List:  Patient Active Problem List   Diagnosis Date Noted  . Left hemiparesis   . Chronic diastolic congestive heart failure   . Persistent atrial fibrillation   . Type 2 diabetes mellitus with peripheral neuropathy   . ICH (intracerebral hemorrhage) 04/04/2015  . Anticoagulated on Coumadin   . Chronic atrial fibrillation   . Hypertensive urgency   . SOB (shortness of breath) 04/03/2014  . Epistaxis 02/15/2014  . SBO (small bowel obstruction) 01/23/2014  . Diabetic nephropathy 09/07/2013  . Chronic constipation 03/25/2013  . Carpal tunnel syndrome 02/25/2013  . Hypothyroidism   . Gout flare 06/01/2012  . Leakage of common bile duct s/p ERCP / stent 05/30/2012  . Ileus post op. still NPO 05/22/2012  . Bile leak, postoperative 05/17/2012  . Hypokalemia 05/17/2012  . Atrial fibrillation with RVR 05/16/2012  . Postoperative ileus 05/16/2012  . Small bowel obstruction due to adhesions 05/16/2012  . Acute on chronic renal insufficiency 05/15/2012  . Normal coronary arteries, 2008 05/15/2012  . Type II or unspecified type diabetes mellitus with neurological manifestations, not stated as uncontrolled 05/15/2012  . HTN (hypertension) 05/15/2012  . Respiratory distress post op secondary to rapid AF 05/15/2012  . ARF (acute renal failure) 05/15/2012  . Abdominal pain, acute, right upper quadrant 05/12/2012  . Nausea and vomiting 05/12/2012  .  Dehydration 05/12/2012  . Cholecystitis chronic, acute, S/P lap cholecystectomy 05/14/12 05/12/2012  . Thoracic aortic aneurysm, 4.8cm 2011   . CHF, past NICM with an EF of 30%, most recent echo 2011 showed EF to be45- 50%   . Anemia 08/09/2011  . History of colon cancer, stage III 08/09/2011    Past Medical History:  Past Medical History  Diagnosis Date  . Colon cancer 07/1997  . Hypertension   . Diabetes mellitus   . CHF (congestive heart failure)   . AAA (abdominal aortic aneurysm)   . Atrial fibrillation   . Hypercholesteremia   . Kidney stone     renal calculi  . Small bowel obstruction due to adhesions 05/16/2012  . Hypothyroidism   . Arthritis   . Anemia   . Cholelithiasis   . Perforation of bile duct   . Hemorrhage of rectum and anus   . Swelling, mass, or lump in head and neck   . Cervicalgia   . Hypoglycemia, unspecified   . Pain in joint, lower leg   . Anxiety   . Shortness of breath   . Acute upper respiratory infections of unspecified site   . Depression   . Thoracic aneurysm without mention of rupture   . GERD (gastroesophageal reflux disease)   . Proteinuria   . Other specified cardiac dysrhythmias(427.89)   . Congestive heart failure, unspecified   . Electrolyte and fluid disorders not elsewhere classified   . Other chronic allergic conjunctivitis   . Hypopotassemia   . Chronic systolic heart failure   . Dizziness and giddiness   . Malignant neoplasm of colon, unspecified site   . Other and unspecified hyperlipidemia   . Gout,  unspecified   . Atrial fibrillation   . Aortic aneurysm of unspecified site without mention of rupture   . Pain in joint, site unspecified   . Palpitations   . CKD (chronic kidney disease)   . Diabetes mellitus    Past Surgical History:  Past Surgical History  Procedure Laterality Date  . Hemicolectomy  08/11/1997  . Kidney stone surgery    . Colon surgery      to remove colon ca  . Cholecystectomy  05/14/2012     Procedure: LAPAROSCOPIC CHOLECYSTECTOMY WITH INTRAOPERATIVE CHOLANGIOGRAM;  Surgeon: Haywood Lasso, MD;  [facility] ;  Service: General;  Laterality: N/A;  . Ercp  05/17/2012    Procedure: ENDOSCOPIC RETROGRADE CHOLANGIOPANCREATOGRAPHY (ERCP);  Surgeon: Missy Sabins, MD;  Location: [facility];  Service: Gastroenterology;  Laterality: N/A;  . Ercp  08/12/2012    Procedure: ENDOSCOPIC RETROGRADE CHOLANGIOPANCREATOGRAPHY (ERCP);  Surgeon: Missy Sabins, MD;  Location: [facility] ENDOSCOPY;  Service: Endoscopy;  Laterality: N/A;  . Transthoracic echocardiogram  11/2009    EF 45-50%, mild-mod AV regurg; mild MR; mod TR; ascending aorta mildly dilated  . Cardiac catheterization  2008    moderate severe pulm HTN; with elevted pulm capillary wedge pressure     Assessment & Plan Clinical Impression: MISHAEL KRYSIAK is a 81 y.o. right handed female with history of hypertension, systolic congestive heart failure, diabetes mellitus peripheral neuropathy, chronic renal insufficiency with baseline creatinine 1.35, atrial fibrillation on chronic Coumadin. Patient independent prior to admission still driving use a single-point cane living alone. Presented 04/04/2015 with altered mental status as well as headache. Blood pressure 166/103. INR 3.6. CT of the head showed a large acute hemorrhage centered in the region of the right caudate nucleus with rupture into the lateral ventricle and evidence of developing hydrocephalus. Parenchymal hemorrhage measuring roughly 3.2 x 4.6 x 3.0 cm. Neurosurgery Dr. Saintclair Halsted consulted advise conservative care. Coumadin was reversed. Follow-up serial cranial CT scans 04/07/2015 showing evolving right basal ganglia hematoma with intraventricular extension. Echocardiogram with ejection fraction of 83% normal systolic function. Renal artery duplex with findings of 1-59 % renal artery stenosis and close monitoring of blood pressure was advised. Maintained on a mechanical soft diet.. Subcutaneous  heparin added for DVT prophylaxis 04/08/2015. Physical and occupational therapy evaluations completed 04/06/2015 with recommendations of physical medicine rehabilitation consult.   Patient transferred to CIR on 04/12/2015 .   Patient currently requires total with mobility secondary to muscle weakness, decreased cardiorespiratoy endurance, impaired timing and sequencing, decreased coordination and decreased motor planning, decreased visual acuity, decreased visual perceptual skills and field cut, decreased attention to left, decreased initiation, decreased attention, decreased awareness, decreased problem solving, decreased safety awareness, decreased memory and delayed processing, central origin and decreased sitting balance, decreased standing balance, hemiplegia and decreased balance strategies.  Prior to hospitalization, patient was modified independent  with mobility and lived with Alone in a House home.  Home access is 2Stairs to enter.  Patient will benefit from skilled PT intervention to maximize safe functional mobility, minimize fall risk and decrease caregiver burden for planned discharge home with 24 hour supervision.  Anticipate patient will benefit from follow up [facility] at discharge.  PT - End of Session Activity Tolerance: Decreased this session Endurance Deficit: Yes Endurance Deficit Description: cardiorespiratory PT Assessment Rehab Potential (ACUTE/IP ONLY): Good Barriers to Discharge: Inaccessible home environment;Decreased caregiver support PT Patient demonstrates impairments in the following area(s): Balance;Behavior;Endurance;Motor;Pain;Perception;Safety;Sensory PT Transfers Functional Problem(s): Bed Mobility;Bed to Chair;Car;Furniture PT Locomotion Functional Problem(s):  Ambulation;Wheelchair Mobility;Stairs PT Plan PT Intensity: Minimum of 1-2 x/day ,45 to 90 minutes PT Frequency: 5 out of 7 days PT Duration Estimated Length of Stay: 3 weeks PT Treatment/Interventions:  Ambulation/gait training;DME/adaptive equipment instruction;Psychosocial support;UE/LE Strength taining/ROM;Balance/vestibular training;Functional electrical stimulation;UE/LE Coordination activities;Cognitive remediation/compensation;Functional mobility training;Splinting/orthotics;Visual/perceptual remediation/compensation;Community reintegration;Neuromuscular re-education;Stair training;Wheelchair propulsion/positioning;Discharge planning;Pain management;Therapeutic Activities;Disease management/prevention;Patient/family education;Therapeutic Exercise PT Transfers Anticipated Outcome(s): SBA ambulation PT Locomotion Anticipated Outcome(s): SBA ambulation PT Recommendation Recommendations for Other Services: Vestibular eval Follow Up Recommendations: Home health PT;Skilled nursing facility (pending progress and discharge location) Patient destination: Home (pending 24 availability of family and progress) Equipment Recommended: To be determined Equipment Details: Pt owns a [facility]  Skilled Therapeutic Intervention Treatment Session 1: PT Evaluation - see below for details. Pt received in bed with nurse tech and RN present, eating breakfast. Therapeutic Activity - PT notes pocketing of food in L side of mouth and PT assists pt in drinking water to help wash this down - SLP notified. See function tab for details with bed mobility. W/C Management - PT provides hand over hand and verbal cues to assist pt in w/c propulsion and pt completes ~10% effort/ability in self propulsion. Pt req assist in all aspects of w/c parts management and PT obtains a cushion for pt's w/c. Gait Training: See function tab for details - pt req manual assist with lateral weight shifts to allow pt to self progress legs, even though it is done in a shuffle pattern. Close w/c follow for safety and pt demonstrates intermittent ambulation, req constant lateral weight shift from PT to continue to self progress legs in walking pattern. Pt  ended up in w/c with quick release belt in place and call light in lap - nurse tech notified pt may need a brief change. Pt demonstrates impairment in multiple domains: cognitive, L side hemiplegia, suspected L visual field cut, balance impairment, difficulty in all aspects of functional mobility, and low activity tolerance.  Treatment Session 2: Gait Training - See function tab for details. Pt ambulates on uneven surface 10' with RW req mod-max A for balance, sequencing, and management of RW. PT instructs pt in stair negotiation - see function tab for details. Up with strong leg, down with weak leg. Therapeutic Activity: PT instructs pt in attempting to pick up pen from floor x 2 attempts - able to reach and barely touch it, but loses balance backwards into w/c, unable to complete activity. PT instructs pt in car transfer at simulated low height req mod-max A. Pt passed off to SLP while up in w/c at end of session. Pt is likely to benefit from IPR PT. Continue per PT POC.    PT Evaluation Precautions/Restrictions Precautions Precautions: Fall Precaution Comments: L hemiplegia Restrictions Weight Bearing Restrictions: No General Chart Reviewed: Yes Family/Caregiver Present: No  Pain Pain Assessment Pain Assessment: No/denies pain  Treatment Session 2: Pt denies pain.   Home Living/Prior Functioning Home Living Available Help at Discharge: Family;Available 24 hours/day Type of Home: House Home Access: Stairs to enter [employer] of Steps: 2 Entrance Stairs-Rails: Right Home Layout: One level Bathroom Shower/Tub: Multimedia programmer: Handicapped height Bathroom Accessibility: Yes Additional Comments: had friend, [name] , of 11 yrs who does not live with her  Lives With: Alone Prior Function Level of Independence: Requires assistive device for independence (SPC in the house)  Able to Take Stairs?: Yes Driving: Yes Vision/Perception    PT suspects L visual  field cut Cognition Overall Cognitive Status: Impaired/Different from baseline Arousal/Alertness: Awake/alert Orientation  Level: Oriented to person;Disoriented to place;Disoriented to time;Disoriented to situation Attention: Focused;Sustained Focused Attention: Impaired Focused Attention Impairment: Verbal basic Sustained Attention: Impaired Sustained Attention Impairment: Verbal basic Awareness: Impaired Awareness Impairment: Intellectual impairment;Emergent impairment;Anticipatory impairment Problem Solving: Impaired Executive Function: Initiating;Self Monitoring Initiating: Impaired Initiating Impairment: Functional basic Self Monitoring: Impaired Self Monitoring Impairment: Functional basic Sensation Sensation Light Touch: Impaired Detail Light Touch Impaired Details: Impaired LUE;Impaired LLE Coordination Gross Motor Movements are Fluid and Coordinated: No Fine Motor Movements are Fluid and Coordinated: No Coordination and Movement Description: slowed movement of L side; slowed movement overall; pt frequently moves R arm/leg when asked to move L Heel Shin Test: slowed movement, decreased rom on L due to hip flexor weakness/fatigue Motor  Motor Motor: Hemiplegia;Motor perseverations Motor - Skilled Clinical Observations: L side motor perseveration, L side hemiplegia  Mobility Bed Mobility Bed Mobility: Rolling Right;Rolling Left;Right Sidelying to Sit;Left Sidelying to Sit;Sit to Supine Rolling Right: 1: +1 Total assist Rolling Right Details: Manual facilitation for placement;Manual facilitation for weight shifting;Verbal cues for technique Rolling Right Details (indicate cue type and reason): Pt resists rolling R Rolling Left: 5: Supervision Rolling Left Details: Verbal cues for technique Right Sidelying to Sit: 3: Mod assist Right Sidelying to Sit Details: Manual facilitation for placement;Manual facilitation for weight shifting;Verbal cues for technique Left Sidelying  to Sit: 2: Max assist;HOB flat Left Sidelying to Sit Details: Manual facilitation for weight shifting;Manual facilitation for placement;Verbal cues for technique;Verbal cues for precautions/safety Sit to Supine: 3: Mod assist;HOB flat Sit to Supine - Details: Manual facilitation for weight shifting;Manual facilitation for placement;Verbal cues for technique Transfers Transfers: Yes Sit to Stand: 3: Mod assist;From bed Sit to Stand Details: Manual facilitation for placement;Manual facilitation for weight shifting;Verbal cues for technique;Verbal cues for precautions/safety Stand Pivot Transfers: 2: Max assist Stand Pivot Transfer Details: Manual facilitation for placement;Manual facilitation for weight shifting;Verbal cues for technique;Verbal cues for precautions/safety;Verbal cues for sequencing Stand Pivot Transfer Details (indicate cue type and reason): retropulsive, shuffle steps Locomotion  Ambulation Ambulation: Yes Ambulation/Gait Assistance: 2: Max assist (progressing to mod A) Ambulation Distance (Feet): 50 Feet Assistive device: Rolling walker Ambulation/Gait Assistance Details: Manual facilitation for weight shifting;Verbal cues for precautions/safety;Verbal cues for technique;Verbal cues for safe use of DME/AE;Verbal cues for gait pattern Gait Gait: Yes Gait Pattern: Impaired Gait Pattern: Step-to pattern;Shuffle;Trunk flexed;Poor foot clearance - right;Poor foot clearance - left;Decreased stride length;Decreased hip/knee flexion - left;Decreased hip/knee flexion - right;Decreased weight shift to right;Decreased weight shift to left Gait velocity: extremely slow Wheelchair Mobility Wheelchair Mobility: Yes Wheelchair Assistance: 1: +1 Total assist Wheelchair Assistance Details: Manual facilitation for placement;Verbal cues for technique;Verbal cues for safe use of DME/AE Wheelchair Propulsion: Both upper extremities Wheelchair Parts Management: Needs assistance Distance: 30'   Trunk/Postural Assessment  Cervical Assessment Cervical Assessment: Exceptions to [facility] Cervical AROM Overall Cervical AROM: Deficits;Due to premorid status Overall Cervical AROM Comments: all planes of motion limited 75% Thoracic Assessment Thoracic Assessment: Exceptions to [facility] Thoracic AROM Overall Thoracic AROM: Deficits;Due to premorid status Overall Thoracic AROM Comments: all planes of motion limited 75% Lumbar Assessment Lumbar Assessment: Exceptions to [facility] Lumbar AROM Overall Lumbar AROM: Deficits;Due to premorid status Overall Lumbar AROM Comments: all planes of motion limited 75% Postural Control Postural Control: Deficits on evaluation Righting Reactions: delayed Protective Responses: delayed Postural Limitations: slouched positioning eob with forward head  Balance Balance Balance Assessed: Yes Static Sitting Balance Static Sitting - Balance Support: Bilateral upper extremity supported;Feet supported Static Sitting - Level of Assistance: 5: Stand  by assistance Dynamic Sitting Balance Dynamic Sitting - Balance Support: Bilateral upper extremity supported;Feet supported Dynamic Sitting - Level of Assistance: 3: Mod assist Static Standing Balance Static Standing - Balance Support: Bilateral upper extremity supported;During functional activity Static Standing - Level of Assistance: 4: Min assist Dynamic Standing Balance Dynamic Standing - Balance Support: Bilateral upper extremity supported;During functional activity Dynamic Standing - Level of Assistance: 3: Mod assist Extremity Assessment  RUE Assessment RUE Assessment: Within Functional Limits (arm flexion limited = baseline) LUE Assessment LUE Assessment: Exceptions to WFL LUE AROM (degrees) Overall AROM Left Upper Extremity: Deficits LUE Overall AROM Comments: due to weakness LUE Strength LUE Overall Strength: Deficits LUE Overall Strength Comments: arm flexion 2+/5, elbow flexion 3+/5, elbow extension 3+/5,  grip 3+/5 RLE Assessment RLE Assessment: Within Functional Limits LLE Assessment LLE Assessment: Exceptions to WFL LLE AROM (degrees) Overall AROM Left Lower Extremity: Deficits;Due to decreased strength LLE Strength LLE Overall Strength: Deficits LLE Overall Strength Comments: hip flexion 3-/5, knee and ankle grossly 4/5   See Function Navigator for Current Functional Status.   Refer to Care Plan for Long Term Goals  Recommendations for other services: Other: Vestibular Evaluation  Discharge Criteria: Patient will be discharged from PT if patient refuses treatment 3 consecutive times without medical reason, if treatment goals not met, if there is a change in medical status, if patient makes no progress towards goals or if patient is discharged from hospital.  The above assessment, treatment plan, treatment alternatives and goals were discussed and mutually agreed upon: by patient  [facility] M 04/13/2015, 12:11 PM

## 2015-04-13 NOTE — Evaluation (Signed)
Occupational Therapy Assessment and Plan  Patient Details  Name: Nicole [name] MRN: [id number] Date of Birth: 1930/11/12  OT Diagnosis: apraxia, cognitive deficits, disturbance of vision and hemiplegia affecting dominant side Rehab Potential: Rehab Potential (ACUTE ONLY): Good ELOS: 20-22 days   Today's Date: 04/13/2015 OT Individual Time: 3428-7681 OT Individual Time Calculation (min): 71 min     Problem List:  Patient Active Problem List   Diagnosis Date Noted  . Left hemiparesis   . Chronic diastolic congestive heart failure   . Persistent atrial fibrillation   . Type 2 diabetes mellitus with peripheral neuropathy   . ICH (intracerebral hemorrhage) 04/04/2015  . Anticoagulated on Coumadin   . Chronic atrial fibrillation   . Hypertensive urgency   . SOB (shortness of breath) 04/03/2014  . Epistaxis 02/15/2014  . SBO (small bowel obstruction) 01/23/2014  . Diabetic nephropathy 09/07/2013  . Chronic constipation 03/25/2013  . Carpal tunnel syndrome 02/25/2013  . Hypothyroidism   . Gout flare 06/01/2012  . Leakage of common bile duct s/p ERCP / stent 05/30/2012  . Ileus post op. still NPO 05/22/2012  . Bile leak, postoperative 05/17/2012  . Hypokalemia 05/17/2012  . Atrial fibrillation with RVR 05/16/2012  . Postoperative ileus 05/16/2012  . Small bowel obstruction due to adhesions 05/16/2012  . Acute on chronic renal insufficiency 05/15/2012  . Normal coronary arteries, 2008 05/15/2012  . Type II or unspecified type diabetes mellitus with neurological manifestations, not stated as uncontrolled 05/15/2012  . HTN (hypertension) 05/15/2012  . Respiratory distress post op secondary to rapid AF 05/15/2012  . ARF (acute renal failure) 05/15/2012  . Abdominal pain, acute, right upper quadrant 05/12/2012  . Nausea and vomiting 05/12/2012  . Dehydration 05/12/2012  . Cholecystitis chronic, acute, S/P lap cholecystectomy 05/14/12 05/12/2012  . Thoracic aortic aneurysm, 4.8cm  2011   . CHF, past NICM with an EF of 30%, most recent echo 2011 showed EF to be45- 50%   . Anemia 08/09/2011  . History of colon cancer, stage III 08/09/2011    Past Medical History:  Past Medical History  Diagnosis Date  . Colon cancer 07/1997  . Hypertension   . Diabetes mellitus   . CHF (congestive heart failure)   . AAA (abdominal aortic aneurysm)   . Atrial fibrillation   . Hypercholesteremia   . Kidney stone     renal calculi  . Small bowel obstruction due to adhesions 05/16/2012  . Hypothyroidism   . Arthritis   . Anemia   . Cholelithiasis   . Perforation of bile duct   . Hemorrhage of rectum and anus   . Swelling, mass, or lump in head and neck   . Cervicalgia   . Hypoglycemia, unspecified   . Pain in joint, lower leg   . Anxiety   . Shortness of breath   . Acute upper respiratory infections of unspecified site   . Depression   . Thoracic aneurysm without mention of rupture   . GERD (gastroesophageal reflux disease)   . Proteinuria   . Other specified cardiac dysrhythmias(427.89)   . Congestive heart failure, unspecified   . Electrolyte and fluid disorders not elsewhere classified   . Other chronic allergic conjunctivitis   . Hypopotassemia   . Chronic systolic heart failure   . Dizziness and giddiness   . Malignant neoplasm of colon, unspecified site   . Other and unspecified hyperlipidemia   . Gout, unspecified   . Atrial fibrillation   . Aortic aneurysm of unspecified site  without mention of rupture   . Pain in joint, site unspecified   . Palpitations   . CKD (chronic kidney disease)   . Diabetes mellitus    Past Surgical History:  Past Surgical History  Procedure Laterality Date  . Hemicolectomy  08/11/1997  . Kidney stone surgery    . Colon surgery      to remove colon ca  . Cholecystectomy  05/14/2012    Procedure: LAPAROSCOPIC CHOLECYSTECTOMY WITH INTRAOPERATIVE CHOLANGIOGRAM;  Surgeon: Haywood Lasso, MD;  Location: [place];  Service:  General;  Laterality: N/A;  . Ercp  05/17/2012    Procedure: ENDOSCOPIC RETROGRADE CHOLANGIOPANCREATOGRAPHY (ERCP);  Surgeon: Missy Sabins, MD;  Location: [place];  Service: Gastroenterology;  Laterality: N/A;  . Ercp  08/12/2012    Procedure: ENDOSCOPIC RETROGRADE CHOLANGIOPANCREATOGRAPHY (ERCP);  Surgeon: Missy Sabins, MD;  Location: [facility] ENDOSCOPY;  Service: Endoscopy;  Laterality: N/A;  . Transthoracic echocardiogram  11/2009    EF 45-50%, mild-mod AV regurg; mild MR; mod TR; ascending aorta mildly dilated  . Cardiac catheterization  2008    moderate severe pulm HTN; with elevted pulm capillary wedge pressure     Assessment & Plan Clinical Impression: 81 y.o. right handed female with history of hypertension, systolic congestive heart failure, diabetes mellitus peripheral neuropathy, chronic renal insufficiency with baseline creatinine 1.35, atrial fibrillation on chronic Coumadin. Patient independent prior to admission still driving use a single-point cane living alone. Presented 04/04/2015 with altered mental status as well as headache. Blood pressure 166/103. INR 3.6. CT of the head showed a large acute hemorrhage centered in the region of the right caudate nucleus with rupture into the lateral ventricle and evidence of developing hydrocephalus. Parenchymal hemorrhage measuring roughly 3.2 x 4.6 x 3.0 cm. Neurosurgery Dr. Saintclair Halsted consulted advise conservative care. Coumadin was reversed. Follow-up serial cranial CT scans 04/07/2015 showing evolving right basal ganglia hematoma with intraventricular extensionPatient transferred to CIR on 04/12/2015 .    Patient currently requires max with basic self-care skills secondary to muscle weakness, decreased cardiorespiratoy endurance, motor apraxia and decreased coordination, decreased visual motor skills and field cut, decreased attention to left, decreased initiation, decreased attention, decreased awareness, decreased problem solving, decreased safety  awareness, decreased memory and delayed processing and decreased sitting balance, decreased standing balance, decreased postural control and hemiplegia.  Prior to hospitalization, patient was fully independent, living alone and driving.  Patient will benefit from skilled intervention to increase independence with basic self-care skills prior to discharge home with care partner.  Anticipate patient will require 24 hour supervision and follow up home health.  OT - End of Session Endurance Deficit: Yes Endurance Deficit Description: cardiorespiratory OT Assessment Rehab Potential (ACUTE ONLY): Good OT Patient demonstrates impairments in the following area(s): Balance;Cognition;Endurance;Motor;Sensory;Safety;Perception;Vision OT Basic ADL's Functional Problem(s): Eating;Grooming;Bathing;Dressing;Toileting OT Transfers Functional Problem(s): Toilet;Tub/Shower OT Additional Impairment(s): Fuctional Use of Upper Extremity OT Plan OT Intensity: Minimum of 1-2 x/day, 45 to 90 minutes OT Frequency: 5 out of 7 days OT Duration/Estimated Length of Stay: 20-22 days OT Treatment/Interventions: Balance/vestibular training;Discharge planning;Functional mobility training;Cognitive remediation/compensation;Patient/family education;Self Care/advanced ADL retraining;Therapeutic Exercise;UE/LE Strength taining/ROM;Visual/perceptual remediation/compensation;UE/LE Coordination activities;Neuromuscular re-education;Therapeutic Activities OT Self Feeding Anticipated Outcome(s): mod I OT Basic Self-Care Anticipated Outcome(s): supervision OT Toileting Anticipated Outcome(s): supervision OT Bathroom Transfers Anticipated Outcome(s): supervision OT Recommendation Patient destination: Home Follow Up Recommendations: Home health OT Equipment Recommended: Tub/shower bench   Skilled Therapeutic Intervention Pt seen for initial evaluation and BADL retraining from w/c level with focus on initiation and attention to task.  Pt has severely impaired processing speed and L incoordination/inattention which requires her to need max A with self care. Pt took an extremely long time to complete denture care with mod A and combing her hair.  Pt not oriented, but was able to read wall calendar. Pt in w/c with quick release belt on at end of session.   OT Evaluation Precautions/Restrictions  Precautions Precautions: Fall Precaution Comments: L hemiplegia Restrictions Weight Bearing Restrictions: No     Pain Pain Assessment Pain Assessment: No/denies pain Home Living/Prior Functioning Home Living Available Help at Discharge: Family, Available 24 hours/day Type of Home: House Home Access: Stairs to enter [employer] of Steps: 2 Entrance Stairs-Rails: Right Home Layout: One level Bathroom Shower/Tub: Gaffer, Chiropodist: Handicapped height Bathroom Accessibility: Yes Additional Comments: had friend, [name] , of 11 yrs who does not live with her  Lives With: Alone Prior Function Level of Independence: Requires assistive device for independence, Independent with homemaking with ambulation, Independent with basic ADLs, Independent with gait, Independent with transfers  Able to Take Stairs?: Yes Driving: Yes Comments: very active and Independent ADL  Refer to function navigator Vision/Perception  Vision- History Baseline Vision/History: Wears glasses Wears Glasses: Reading only Patient Visual Report: Other (comment);Blurring of vision Vision- Assessment Vision Assessment?: Vision impaired- to be further tested in functional context  Cognition Overall Cognitive Status: Impaired/Different from baseline Arousal/Alertness: Awake/alert Year: 2017 Month: March Day of Week: Incorrect Memory: Impaired Memory Impairment: Storage deficit;Retrieval deficit;Decreased recall of new information;Decreased long term memory Decreased Long Term Memory: Verbal basic;Functional  basic Immediate Memory Recall: Sock;Blue;Bed Attention: Focused;Sustained Focused Attention: Impaired Focused Attention Impairment: Verbal basic Sustained Attention: Impaired Sustained Attention Impairment: Verbal basic Awareness: Impaired Awareness Impairment: Intellectual impairment;Emergent impairment;Anticipatory impairment Problem Solving: Impaired Executive Function: Initiating;Self Monitoring Initiating: Impaired Initiating Impairment: Functional basic Self Monitoring: Impaired Self Monitoring Impairment: Functional basic Sensation Sensation Light Touch: Impaired Detail Light Touch Impaired Details: Impaired LUE;Impaired LLE Stereognosis: Impaired by gross assessment Hot/Cold: Appears Intact Proprioception: Impaired by gross assessment Coordination Gross Motor Movements are Fluid and Coordinated: No Fine Motor Movements are Fluid and Coordinated: No Coordination and Movement Description: slowed movement of L side; slowed movement overall; pt frequently moves R arm/leg when asked to move L Finger Nose Finger Test: very delayed in both UE with decreased accuracy with L more involved than R Motor  Motor Motor: Hemiplegia;Motor perseverations Motor - Skilled Clinical Observations: L side motor perseveration, L side hemiplegia Mobility    refer to functional navigator Trunk/Postural Assessment  Cervical Assessment Cervical Assessment: Exceptions to [facility] Cervical AROM Overall Cervical AROM: Deficits;Due to premorid status Overall Cervical AROM Comments: all planes of motion limited 75% Thoracic Assessment Thoracic Assessment: Exceptions to [facility] Thoracic AROM Overall Thoracic AROM: Deficits;Due to premorid status Overall Thoracic AROM Comments: all planes of motion limited 75% Lumbar Assessment Lumbar Assessment: Exceptions to [facility] Lumbar AROM Overall Lumbar AROM: Deficits;Due to premorid status Overall Lumbar AROM Comments: all planes of motion limited 75% Postural  Control Postural Control: Deficits on evaluation Righting Reactions: delayed Protective Responses: delayed Postural Limitations: slouched positioning eob with forward head  Balance Balance Balance Assessed: Yes Static Sitting Balance Static Sitting - Balance Support: Bilateral upper extremity supported;Feet supported Static Sitting - Level of Assistance: 5: Stand by assistance Dynamic Sitting Balance Dynamic Sitting - Balance Support: Bilateral upper extremity supported;Feet supported Dynamic Sitting - Level of Assistance: 3: Mod assist Static Standing Balance Static Standing - Balance Support: Bilateral upper extremity supported;During functional activity Static Standing -  Level of Assistance: 4: Min assist Dynamic Standing Balance Dynamic Standing - Balance Support: Bilateral upper extremity supported;During functional activity Dynamic Standing - Level of Assistance: 3: Mod assist Extremity/Trunk Assessment RUE Assessment RUE Assessment: Within Functional Limits LUE AROM (degrees) LUE Overall AROM Comments: due to weakness LUE Strength LUE Overall Strength Comments: arm flexion 2+/5, elbow flexion 3+/5, elbow extension 3+/5, grip 3+/5   See Function Navigator for Current Functional Status.   Refer to Care Plan for Long Term Goals  Recommendations for other services: Neuropsych  Discharge Criteria: Patient will be discharged from OT if patient refuses treatment 3 consecutive times without medical reason, if treatment goals not met, if there is a change in medical status, if patient makes no progress towards goals or if patient is discharged from hospital.  The above assessment, treatment plan, treatment alternatives and goals were discussed and mutually agreed upon: No family available/patient unable.  [place] 04/13/2015, 1:02 PM

## 2015-04-13 NOTE — Evaluation (Signed)
Speech Language Pathology Assessment and Plan  Patient Details  Name: Nicole [name] MRN: [id number] Date of Birth: [birth year]  SLP Diagnosis: Cognitive Impairments;Dysphagia  Rehab Potential: Good ELOS: 3 weeks    Today's Date: 04/13/2015 SLP Individual Time: 1330-1430 SLP Individual Time Calculation (min): 60 min   Problem List:  Patient Active Problem List   Diagnosis Date Noted  . Left hemiparesis   . Chronic diastolic congestive heart failure   . Persistent atrial fibrillation   . Type 2 diabetes mellitus with peripheral neuropathy   . ICH (intracerebral hemorrhage) 04/04/2015  . Anticoagulated on Coumadin   . Chronic atrial fibrillation   . Hypertensive urgency   . SOB (shortness of breath) 04/03/2014  . Epistaxis 02/15/2014  . SBO (small bowel obstruction) 01/23/2014  . Diabetic nephropathy 09/07/2013  . Chronic constipation 03/25/2013  . Carpal tunnel syndrome 02/25/2013  . Hypothyroidism   . Gout flare 06/01/2012  . Leakage of common bile duct s/p ERCP / stent 05/30/2012  . Ileus post op. still NPO 05/22/2012  . Bile leak, postoperative 05/17/2012  . Hypokalemia 05/17/2012  . Atrial fibrillation with RVR 05/16/2012  . Postoperative ileus 05/16/2012  . Small bowel obstruction due to adhesions 05/16/2012  . Acute on chronic renal insufficiency 05/15/2012  . Normal coronary arteries, 2008 05/15/2012  . Type II or unspecified type diabetes mellitus with neurological manifestations, not stated as uncontrolled 05/15/2012  . HTN (hypertension) 05/15/2012  . Respiratory distress post op secondary to rapid AF 05/15/2012  . ARF (acute renal failure) 05/15/2012  . Abdominal pain, acute, right upper quadrant 05/12/2012  . Nausea and vomiting 05/12/2012  . Dehydration 05/12/2012  . Cholecystitis chronic, acute, S/P lap cholecystectomy 05/14/12 05/12/2012  . Thoracic aortic aneurysm, 4.8cm 2011   . CHF, past NICM with an EF of 30%, most recent echo 2011 showed EF to  be45- 50%   . Anemia 08/09/2011  . History of colon cancer, stage III 08/09/2011   Past Medical History:  Past Medical History  Diagnosis Date  . Colon cancer 07/1997  . Hypertension   . Diabetes mellitus   . CHF (congestive heart failure)   . AAA (abdominal aortic aneurysm)   . Atrial fibrillation   . Hypercholesteremia   . Kidney stone     renal calculi  . Small bowel obstruction due to adhesions 05/16/2012  . Hypothyroidism   . Arthritis   . Anemia   . Cholelithiasis   . Perforation of bile duct   . Hemorrhage of rectum and anus   . Swelling, mass, or lump in head and neck   . Cervicalgia   . Hypoglycemia, unspecified   . Pain in joint, lower leg   . Anxiety   . Shortness of breath   . Acute upper respiratory infections of unspecified site   . Depression   . Thoracic aneurysm without mention of rupture   . GERD (gastroesophageal reflux disease)   . Proteinuria   . Other specified cardiac dysrhythmias(427.89)   . Congestive heart failure, unspecified   . Electrolyte and fluid disorders not elsewhere classified   . Other chronic allergic conjunctivitis   . Hypopotassemia   . Chronic systolic heart failure   . Dizziness and giddiness   . Malignant neoplasm of colon, unspecified site   . Other and unspecified hyperlipidemia   . Gout, unspecified   . Atrial fibrillation   . Aortic aneurysm of unspecified site without mention of rupture   . Pain in joint, site unspecified   .  Palpitations   . CKD (chronic kidney disease)   . Diabetes mellitus    Past Surgical History:  Past Surgical History  Procedure Laterality Date  . Hemicolectomy  08/11/1997  . Kidney stone surgery    . Colon surgery      to remove colon ca  . Cholecystectomy  05/14/2012    Procedure: LAPAROSCOPIC CHOLECYSTECTOMY WITH INTRAOPERATIVE CHOLANGIOGRAM;  Surgeon: Haywood Lasso, MD;  Location: [place];  Service: General;  Laterality: N/A;  . Ercp  05/17/2012    Procedure: ENDOSCOPIC  RETROGRADE CHOLANGIOPANCREATOGRAPHY (ERCP);  Surgeon: Missy Sabins, MD;  Location: [place];  Service: Gastroenterology;  Laterality: N/A;  . Ercp  08/12/2012    Procedure: ENDOSCOPIC RETROGRADE CHOLANGIOPANCREATOGRAPHY (ERCP);  Surgeon: Missy Sabins, MD;  Location: [facility] ENDOSCOPY;  Service: Endoscopy;  Laterality: N/A;  . Transthoracic echocardiogram  11/2009    EF 45-50%, mild-mod AV regurg; mild MR; mod TR; ascending aorta mildly dilated  . Cardiac catheterization  2008    moderate severe pulm HTN; with elevted pulm capillary wedge pressure     Assessment / Plan / Recommendation Clinical Impression Patient is an 81 y.o. right handed female with history of hypertension, systolic congestive heart failure, diabetes mellitus peripheral neuropathy, chronic renal insufficiency with baseline creatinine 1.35, atrial fibrillation on chronic Coumadin. Patient independent prior to admission still driving use a single-point cane living alone. Presented 04/04/2015 with altered mental status as well as headache. Blood pressure 166/103. INR 3.6. CT of the head showed a large acute hemorrhage centered in the region of the right caudate nucleus with rupture into the lateral ventricle and evidence of developing hydrocephalus. Parenchymal hemorrhage measuring roughly 3.2 x 4.6 x 3.0 cm. Neurosurgery Dr. Saintclair Halsted consulted and advised conservative care. Coumadin was reversed. Follow-up serial cranial CT scans on 04/07/2015 showing evolving right basal ganglia hematoma with intraventricular extension. Patient transferred to CIR on 04/12/2015 and demonstrates severe cognitive impairments impacting orientation, sustained attention, intellectual awareness, initiation, functional problem solving, recall and carryover of information and safety awareness which impacts her ability to complete functional and familiar tasks safely. Patient also demonstrates delayed processing with language of confusion. Patient was administered a BSE and did  not demonstrate any overt s/s of aspiration with thin liquids via cup, Dys. 1, 2 or 3 textures with self-fed trials. However, patient demonstrated prolonged mastication with mild oral residue that cleared with a liquid wash. Patient's RN reported consistent coughing with medications and meals, therefore, patient was observed with medications crushed in puree textures. Patient demonstrated oral holding with verbal cues needed to initiate a swallow with consistent coughing with puree and thin liquids via cup.  Question if increased difficulty is due to patient not self-feeding leading to increased holding and delayed swallow initiation.  Patient was able to manage thin liquids via cup with verbal and tactile cues needed for initiation, however, coughing was reduced. Recommend Dys. 1 textures with thin liquids via cup with full supervision to maximize patient's overall safety. Patient would benefit from skilled SLP intervention to maximize her cognitive and swallowing function and overall functional independence prior to discharge.   Skilled Therapeutic Interventions          Administered a cognitive-linguistic evaluation and BSE. Please see above for details.   SLP Assessment  Patient will need skilled Speech Lanaguage Pathology Services during CIR admission    Recommendations  SLP Diet Recommendations: Thin;Dysphagia 1 (Puree) Liquid Administration via: Cup;No straw Medication Administration: Crushed with puree Supervision: Full supervision/cueing for compensatory strategies;Patient  able to self feed Compensations: Slow rate;Small sips/bites;Check for pocketing;Other (Comment) Postural Changes and/or Swallow Maneuvers: Seated upright 90 degrees;Upright 30-60 min after meal Oral Care Recommendations: Oral care BID Recommendations for Other Services: Neuropsych consult Patient destination: Home Follow up Recommendations: Home Health SLP;Outpatient SLP;24 hour supervision/assistance Equipment  Recommended: None recommended by SLP    SLP Frequency 3 to 5 out of 7 days   SLP Treatment/Interventions Cognitive remediation/compensation;Cueing hierarchy;Dysphagia/aspiration precaution training;Environmental controls;Functional tasks;Internal/external aids;Patient/family education;Therapeutic Activities   Pain No/Denies Pain   Function:  Eating Eating   Modified Consistency Diet: Yes Eating Assist Level: More than reasonable amount of time;Set up assist for;Supervision or verbal cues;Help with picking up utensils;Help managing cup/glass   Eating Set Up Assist For: Opening containers       Cognition Comprehension Comprehension assist level: Understands basic 25 - 49% of the time/ requires cueing 50 - 75% of the time  Expression   Expression assist level: Expresses basic 25 - 49% of the time/requires cueing 50 - 75% of the time. Uses single words/gestures.  Social Interaction Social Interaction assist level: Interacts appropriately 50 - 74% of the time - May be physically or verbally inappropriate.  Problem Solving Problem solving assist level: Solves basic less than 25% of the time - needs direction nearly all the time or does not effectively solve problems and may need a restraint for safety  Memory Memory assist level: Recognizes or recalls less than 25% of the time/requires cueing greater than 75% of the time   Short Term Goals: Week 1: SLP Short Term Goal 1 (Week 1): Patient will utilize external aids/cues for orientation to place, time and situation with Max A multimodal cues.  SLP Short Term Goal 2 (Week 1): Patient will demonstrate sustained attention to functional tasks for 2 minutes with Max A multimodal cues.  SLP Short Term Goal 3 (Week 1): Patient will identify 1 physical and 1 cognitive deficit with Max A multimodal cues.  SLP Short Term Goal 4 (Week 1): Patient will initiate functional tasks in 50% of opportunities with Max A multimodal cues.  SLP Short Term Goal 5  (Week 1): Patient will consume current diet with minimal overt s/s of aspiration with Max A multimodal cues for use of swallow strategies.  SLP Short Term Goal 6 (Week 1): Patient will demonstrate efficient mastication of Dys. 2 textures with minimal overt s/s of aspiration across 3 sessions to assess readiness for upgrade with Max A multimodal cues.   Refer to Care Plan for Long Term Goals  Recommendations for other services: Neuropsych  Discharge Criteria: Patient will be discharged from SLP if patient refuses treatment 3 consecutive times without medical reason, if treatment goals not met, if there is a change in medical status, if patient makes no progress towards goals or if patient is discharged from hospital.  The above assessment, treatment plan, treatment alternatives and goals were discussed and mutually agreed upon: No family available/patient unable  [place], [place] 04/13/2015, 5:13 PM

## 2015-04-13 NOTE — Care Management Note (Signed)
[facility] Individual Statement of Services  Patient Name:  Nicole [name]  Date:  04/13/2015  Welcome to the [facility].  Our goal is to provide you with an individualized program based on your diagnosis and situation, designed to meet your specific needs.  With this comprehensive rehabilitation program, you will be expected to participate in at least 3 hours of rehabilitation therapies Monday-Friday, with modified therapy programming on the weekends.  Your rehabilitation program will include the following services:  Physical Therapy (PT), Occupational Therapy (OT), Speech Therapy (ST), 24 hour per day rehabilitation nursing, Therapeutic Recreaction (TR), Case Management (Social Worker), Rehabilitation Medicine, Nutrition Services and Pharmacy Services  Weekly team conferences will be held on Wednesday to discuss your progress.  Your Social Worker will talk with you frequently to get your input and to update you on team discussions.  Team conferences with you and your family in attendance may also be held.  Expected length of stay: 20-22 days Overall anticipated outcome: supervision with cueing-min assist stairs  Depending on your progress and recovery, your program may change. Your Social Worker will coordinate services and will keep you informed of any changes. Your Social Worker's name and contact numbers are listed  below.  The following services may also be recommended but are not provided by the [facility] will be made to provide these services after discharge if needed.  Arrangements include referral to agencies that provide these services.  Your insurance has been verified to be:  [employer]-Medicare Your primary doctor is:  Hollace Kinnier  Pertinent information will be shared with your doctor and your insurance  company.  Social Worker:  Ovidio Kin, [place] or ([phone]  Information discussed with and copy given to patient by: Elease Hashimoto, 04/13/2015, 9:39 AM

## 2015-04-13 NOTE — Progress Notes (Signed)
Patient information reviewed and entered into eRehab system by Lakenzie Mcclafferty, RN, CRRN, PPS Coordinator.  Information including medical coding and functional independence measure will be reviewed and updated through discharge.     Per nursing patient was given "Data Collection Information Summary for Patients in Inpatient Rehabilitation Facilities with attached "Privacy Act Statement-Health Care Records" upon admission.  

## 2015-04-13 NOTE — Progress Notes (Signed)
Pt had no void throughout shift. Bladder scan 513ml with pt stating 'I need to pee'. Attempted to void with very minimal results. I&O cath 5072 with 561ml amber, malodorous urine. Urine culture and urine analysis sent to lab per order from [facility], [place].  Pt unable to eat dinner at this time due to lack of awareness and drowsiness. Notified and educated family on importance of pt to be fully alert and awake with all meals. Will cont. To monitor pt.

## 2015-04-13 NOTE — Progress Notes (Signed)
Patient ID: Nicole [name], female   DOB: Mar 14, 1931, 81 y.o.   MRN: [id number] 81 y.o. right handed female with history of hypertension, systolic congestive heart failure, diabetes mellitus peripheral neuropathy, chronic renal insufficiency with baseline creatinine 1.35, atrial fibrillation on chronic Coumadin. Patient independent prior to admission still driving use a single-point cane living alone. Presented 04/04/2015 with altered mental status as well as headache. Blood pressure 166/103. INR 3.6. CT of the head showed a large acute hemorrhage centered in the region of the right caudate nucleus with rupture into the lateral ventricle and evidence of developing hydrocephalus. Parenchymal hemorrhage measuring roughly 3.2 x 4.6 x 3.0 cm. Neurosurgery Dr. Saintclair Halsted consulted advise conservative care. Coumadin was reversed. Follow-up serial cranial CT scans 04/07/2015 showing evolving right basal ganglia hematoma with intraventricular extension  Subjective/Complaints: Pt states she had a good night, asked admissions nurse when the bus was coming ROS- neg bowel, bladder breathing or pain issues Objective: Vital Signs: Blood pressure 141/82, pulse 66, temperature 99.6 F (37.6 C), temperature source Oral, resp. rate 16, height _0  (1.753 m), weight 81 kg (178 lb 9.2 oz), SpO2 99 %. Dg Swallowing Func-speech Pathology  04/11/2015    Objective Swallowing Evaluation:   MBS Patient Details  Name: Nicole [name] MRN: [id number] Date of Birth: 1931-01-02  Today's Date: 04/11/2015 Time: SLP Start Time (ACUTE ONLY): 1033-SLP Stop Time (ACUTE ONLY): 1048 SLP Time Calculation (min) (ACUTE ONLY): 15 min  Past Medical History:  Past Medical History  Diagnosis Date  . Colon cancer 07/1997  . Hypertension   . Diabetes mellitus   . CHF (congestive heart failure)   . AAA (abdominal aortic aneurysm)   . Atrial fibrillation   . Hypercholesteremia   . Kidney stone     renal calculi  . Small bowel obstruction due to adhesions  05/16/2012  . Hypothyroidism   . Arthritis   . Anemia   . Cholelithiasis   . Perforation of bile duct   . Hemorrhage of rectum and anus   . Swelling, mass, or lump in head and neck   . Cervicalgia   . Hypoglycemia, unspecified   . Pain in joint, lower leg   . Anxiety   . Shortness of breath   . Acute upper respiratory infections of unspecified site   . Depression   . Thoracic aneurysm without mention of rupture   . GERD (gastroesophageal reflux disease)   . Proteinuria   . Other specified cardiac dysrhythmias(427.89)   . Congestive heart failure, unspecified   . Electrolyte and fluid disorders not elsewhere classified   . Other chronic allergic conjunctivitis   . Hypopotassemia   . Chronic systolic heart failure   . Dizziness and giddiness   . Malignant neoplasm of colon, unspecified site   . Other and unspecified hyperlipidemia   . Gout, unspecified   . Atrial fibrillation   . Aortic aneurysm of unspecified site without mention of rupture   . Pain in joint, site unspecified   . Palpitations   . CKD (chronic kidney disease)   . Diabetes mellitus    Past Surgical History:  Past Surgical History  Procedure Laterality Date  . Hemicolectomy  08/11/1997  . Kidney stone surgery    . Colon surgery      to remove colon ca  . Cholecystectomy  05/14/2012    Procedure: LAPAROSCOPIC CHOLECYSTECTOMY WITH INTRAOPERATIVE  CHOLANGIOGRAM;  Surgeon: Haywood Lasso, MD;  Location: [facility];   Service: General;  Laterality: N/A;  .  Ercp  05/17/2012    Procedure: ENDOSCOPIC RETROGRADE CHOLANGIOPANCREATOGRAPHY (ERCP);   Surgeon: Missy Sabins, MD;  Location: [facility];  Service: Gastroenterology;   Laterality: N/A;  . Ercp  08/12/2012    Procedure: ENDOSCOPIC RETROGRADE CHOLANGIOPANCREATOGRAPHY (ERCP);   Surgeon: Missy Sabins, MD;  Location: [facility];  Service: Endoscopy;   Laterality: N/A;  . Transthoracic echocardiogram  11/2009    EF 45-50%, mild-mod AV regurg; mild MR; mod TR; ascending aorta mildly  dilated  . Cardiac catheterization   2008    moderate severe pulm HTN; with elevted pulm capillary wedge pressure    HPI:  Other Pertinent Information: Nicole [name] is an 81 y.o. female history  of atrial fibrillation on Coumadin, hypertension, hyperlipidemia, diabetes  mellitus, CHF and chronic kidney disease, brought to the emergency room  after being found on the floor home. CT scan of her head showed fairly  large intracerebral hemorrhage involving the right caudate and extending  into right ventricle with mass effect with mild midline shift as well as  right ventricular enlargement. evaluated by Dr. Saintclair Halsted of the Neurosurgery  Service. Ventricular drain is not felt to be warranted at this time.  No Data Recorded  Assessment / Plan / Recommendation CHL IP CLINICAL IMPRESSIONS 04/11/2015  Therapy Diagnosis Moderate oral phase dysphagia;Mild pharyngeal phase  dysphagia  Clinical Impression Patient presents with a primarily oral phase dysphagia  with secondary pharyngeal component. Oral phase marked by delayed oral  transit with tongue pumping, requiring moderate verbal cueing for  initiation of swallow, all of which likely due to cognitive deficits  (decreased awareness and attention). Self feeding increases A-P oral  transit time. The combination of above results in intermittently delayed  swallow initiation and trace penetration of thin liquids via straw which  consistently clear either during the swallow or after with spontaneous  throat clear/cough. Mild vallecular residuals remain post swallow due to  base of tongue weakness do increase aspiration risk as patient unable to  complete cued dry swallow in attempts to clear. Recommend initiation of  dysphagia 3 solids, thin liquids with full supervision to assist with cues  for rapid oral transit as needed. Allow to self feed.       CHL IP TREATMENT RECOMMENDATION 04/11/2015  Treatment Recommendations Therapy as outlined in treatment plan below     CHL IP DIET RECOMMENDATION 04/11/2015  SLP Diet  Recommendations Dysphagia 3 (Mech soft);Thin  Liquid Administration via (None)  Medication Administration Crushed with puree  Compensations Slow rate;Small sips/bites  Postural Changes and/or Swallow Maneuvers (None)     CHL IP OTHER RECOMMENDATIONS 04/11/2015  Recommended Consults (None)  Oral Care Recommendations Oral care BID  Other Recommendations (None)     CHL IP FOLLOW UP RECOMMENDATIONS 04/10/2015  Follow up Recommendations (No Data)     CHL IP FREQUENCY AND DURATION 04/11/2015  Speech Therapy Frequency (ACUTE ONLY) min 3x week  Treatment Duration 2 weeks             CHL IP REASON FOR REFERRAL 04/11/2015  Reason for Referral Objectively evaluate swallowing function            Gabriel Rainwater MA, CCC-SLP [phone]         McCoy Leah Meryl 04/11/2015, 11:35 AM    Results for orders placed or performed during the hospital encounter of 04/12/15 (from the past 72 hour(s))  Glucose, capillary     Status: Abnormal   Collection Time: 04/12/15  8:52 PM  Result Value  Ref Range   Glucose-Capillary 107 (H) 65 - 99 mg/dL  CBC     Status: Abnormal   Collection Time: 04/12/15  9:15 PM  Result Value Ref Range   WBC 10.8 (H) 4.0 - 10.5 K/uL   RBC 4.54 3.87 - 5.11 MIL/uL   Hemoglobin 12.2 12.0 - 15.0 g/dL   HCT 34.9 (L) 36.0 - 46.0 %   MCV 76.9 (L) 78.0 - 100.0 fL   MCH 26.9 26.0 - 34.0 pg   MCHC 35.0 30.0 - 36.0 g/dL   RDW 17.2 (H) 11.5 - 15.5 %   Platelets 259 150 - 400 K/uL  Creatinine, serum     Status: Abnormal   Collection Time: 04/12/15  9:15 PM  Result Value Ref Range   Creatinine, Ser 1.16 (H) 0.44 - 1.00 mg/dL   GFR calc non Af Amer 43 (L) >60 mL/min   GFR calc Af Amer 49 (L) >60 mL/min    Comment: (NOTE) The eGFR has been calculated using the CKD EPI equation. This calculation has not been validated in all clinical situations. eGFR's persistently <60 mL/min signify possible Chronic Kidney Disease.   CBC WITH DIFFERENTIAL     Status: Abnormal   Collection Time: 04/13/15  5:46 AM  Result  Value Ref Range   WBC 8.7 4.0 - 10.5 K/uL   RBC 4.47 3.87 - 5.11 MIL/uL   Hemoglobin 11.7 (L) 12.0 - 15.0 g/dL   HCT 34.2 (L) 36.0 - 46.0 %   MCV 76.5 (L) 78.0 - 100.0 fL   MCH 26.2 26.0 - 34.0 pg   MCHC 34.2 30.0 - 36.0 g/dL   RDW 17.1 (H) 11.5 - 15.5 %   Platelets 297 150 - 400 K/uL   Neutrophils Relative % 78 %   Neutro Abs 6.7 1.7 - 7.7 K/uL   Lymphocytes Relative 11 %   Lymphs Abs 1.0 0.7 - 4.0 K/uL   Monocytes Relative 11 %   Monocytes Absolute 1.0 0.1 - 1.0 K/uL   Eosinophils Relative 0 %   Eosinophils Absolute 0.0 0.0 - 0.7 K/uL   Basophils Relative 0 %   Basophils Absolute 0.0 0.0 - 0.1 K/uL  Comprehensive metabolic panel     Status: Abnormal   Collection Time: 04/13/15  5:46 AM  Result Value Ref Range   Sodium 139 135 - 145 mmol/L   Potassium 3.2 (L) 3.5 - 5.1 mmol/L   Chloride 108 101 - 111 mmol/L   CO2 23 22 - 32 mmol/L   Glucose, Bld 133 (H) 65 - 99 mg/dL   BUN 17 6 - 20 mg/dL   Creatinine, Ser 1.13 (H) 0.44 - 1.00 mg/dL   Calcium 8.9 8.9 - 10.3 mg/dL   Total Protein 6.4 (L) 6.5 - 8.1 g/dL   Albumin 2.5 (L) 3.5 - 5.0 g/dL   AST 13 (L) 15 - 41 U/L   ALT 8 (L) 14 - 54 U/L   Alkaline Phosphatase 71 38 - 126 U/L   Total Bilirubin 0.8 0.3 - 1.2 mg/dL   GFR calc non Af Amer 44 (L) >60 mL/min   GFR calc Af Amer 51 (L) >60 mL/min    Comment: (NOTE) The eGFR has been calculated using the CKD EPI equation. This calculation has not been validated in all clinical situations. eGFR's persistently <60 mL/min signify possible Chronic Kidney Disease.    Anion gap 8 5 - 15  Glucose, capillary     Status: Abnormal   Collection Time: 04/13/15  6:29 AM  Result Value Ref Range   Glucose-Capillary 123 (H) 65 - 99 mg/dL     HEENT: normal Cardio: RRR and diastolic murmur Resp: CTA B/L and unlabored GI: BS positive and non distended Extremity:  No Edema Skin:   Intact Neuro: Confused, Flat, Cranial Nerve II-XII normal, Normal Sensory, Abnormal Motor 3- Left delt bi tri  2-grip 3- L HF, 4- KE , 3- ankle DF, 5/5 on Right side and Abnormal FMC Ataxic/ dec FMC Musc/Skel:  Other no pain with UE or LE ROM Gen NAD   Assessment/Plan: 1. Functional deficits secondary to right caudate intercerebral hemorrhage secondary to hypertensive crisis with left hemiparesis which require 3+ hours per day of interdisciplinary therapy in a comprehensive inpatient rehab setting. Physiatrist is providing close team supervision and 24 hour management of active medical problems listed below. Physiatrist and rehab team continue to assess barriers to discharge/monitor patient progress toward functional and medical goals. FIM:       Function - Toileting Toileting steps completed by helper: Adjust clothing prior to toileting, Adjust clothing after toileting, Performs perineal hygiene Toileting Assistive Devices: Other (comment) (bed pan) Assist level: More than reasonable time, Two helpers (poor bed mobility due to weakness)  Function - Toilet Transfers Toilet transfer assistive device: Bedside commode, Drop arm commode, Grab bar, Walker, Elevated toilet seat/BSC over toilet Assist level to toilet: Maximal assist (Pt 25 - 49%/lift and lower) Assist level from toilet: Maximal assist (Pt 25 - 49%/lift and lower) Assist level to bedside commode (at bedside): Maximal assist (Pt 25 - 49%/lift and lower) Assist level from bedside commode (at bedside): Maximal assist (Pt 25 - 49%/lift and lower)        Function - Comprehension Comprehension: Auditory, Visual Comprehension assist level: Follows basic conversation/direction with no assist  Function - Expression Expression: Verbal Expression assist level: Expresses basic 25 - 49% of the time/requires cueing 50 - 75% of the time. Uses single words/gestures.  Function - Social Interaction Social Interaction assist level: Interacts appropriately 25 - 49% of time - Needs frequent redirection.  Function - Problem Solving Problem  solving assist level: Solves basic 25 - 49% of the time - needs direction more than half the time to initiate, plan or complete simple activities  Function - Memory Memory assist level: Recognizes or recalls less than 25% of the time/requires cueing greater than 75% of the time  Medical Problem List and Plan: 1. Functional deficits secondary to right caudate intercerebral hemorrhage secondary to hypertensive crisis 2. DVT Prophylaxis/Anticoagulation: Subcutaneous heparin added for DVT prophylaxis 04/08/2015 3. Pain Management: Tylenol as needed 4. Hypertension. Clonidine 0.2 mg 3 times a day, Cardizem 240 mg daily, lisinopril 20 mg twice a day, Lopressor 25 mg twice a day. Will allow a degree of "permissive hypertension" to maintain perfusion, but needs better control.141/82- good range this am 5. Neuropsych: This patient is capable of making decisions on her own behalf. 6. Skin/Wound Care: Routine skin checks 7. Fluids/Electrolytes/Nutrition: Routine I&O with follow-up chemistries 8. Systolic congestive heart failure. Check daily weights and I's and O's. Patient on Lasix 40 mg daily prior to admission. Resume as needed 9. Atrial fibrillation. Chronic Coumadin discontinued secondary to intracerebral hemorrhage. Cardiac rate controlled. Continue Cardizem and Lopressor 10. Diabetes mellitus peripheral neuropathy. Hemoglobin A1c 5.9. Sliding scale insulin. Check blood sugars before meals and at bedtime. Patient on Glucophage 500 mg twice a day prior to admission. Resume depending on cbg patterns.am glucose is 123 will monitor 11. Chronic renal insufficiency. Baseline creatinine 1.35.  Follow-up chemistries in am. Past labs all reviewed. 12. Hypothyroidism. TSH level 2.053. Continue Synthroid 13. History of gout. Zyloprim 100 mg daily. Not active currently 14. Hyperlipidemia. Resume statin at discharge. 15.  Hypokalemia- start KCL and do f/u BMET LOS (Days) 1 A FACE TO [place] E 04/13/2015, 7:20 AM

## 2015-04-14 ENCOUNTER — Inpatient Hospital Stay (HOSPITAL_COMMUNITY): Payer: Medicare Other | Admitting: Physical Therapy

## 2015-04-14 ENCOUNTER — Inpatient Hospital Stay (HOSPITAL_COMMUNITY): Payer: Medicare Other | Admitting: Speech Pathology

## 2015-04-14 ENCOUNTER — Inpatient Hospital Stay (HOSPITAL_COMMUNITY): Payer: Medicare Other | Admitting: Occupational Therapy

## 2015-04-14 ENCOUNTER — Inpatient Hospital Stay (HOSPITAL_COMMUNITY): Payer: Medicare Other

## 2015-04-14 LAB — GLUCOSE, CAPILLARY
GLUCOSE-CAPILLARY: 121 mg/dL — AB (ref 65–99)
Glucose-Capillary: 142 mg/dL — ABNORMAL HIGH (ref 65–99)
Glucose-Capillary: 134 mg/dL — ABNORMAL HIGH (ref 65–99)
Glucose-Capillary: 143 mg/dL — ABNORMAL HIGH (ref 65–99)

## 2015-04-14 NOTE — Progress Notes (Signed)
Patient ID: Nicole [name], female   DOB: 1931/04/19, 81 y.o.   MRN: [id number]   Subjective/Complaints: Pt states she wants to "choke" her therapist- "we don't march to the same beat"  We discussed that therapy was designed to help her recover from CVA and that she needs to participate ROS- neg bowel, bladder breathing or pain issues Objective: Vital Signs: Blood pressure 168/86, pulse 75, temperature 98.6 F (37 C), temperature source Oral, resp. rate 18, height 5' 9"  (1.753 m), weight 79.85 kg (176 lb 0.6 oz), SpO2 100 %. No results found. Results for orders placed or performed during the hospital encounter of 04/12/15 (from the past 72 hour(s))  Glucose, capillary     Status: Abnormal   Collection Time: 04/12/15  8:52 PM  Result Value Ref Range   Glucose-Capillary 107 (H) 65 - 99 mg/dL  CBC     Status: Abnormal   Collection Time: 04/12/15  9:15 PM  Result Value Ref Range   WBC 10.8 (H) 4.0 - 10.5 K/uL   RBC 4.54 3.87 - 5.11 MIL/uL   Hemoglobin 12.2 12.0 - 15.0 g/dL   HCT 34.9 (L) 36.0 - 46.0 %   MCV 76.9 (L) 78.0 - 100.0 fL   MCH 26.9 26.0 - 34.0 pg   MCHC 35.0 30.0 - 36.0 g/dL   RDW 17.2 (H) 11.5 - 15.5 %   Platelets 259 150 - 400 K/uL  Creatinine, serum     Status: Abnormal   Collection Time: 04/12/15  9:15 PM  Result Value Ref Range   Creatinine, Ser 1.16 (H) 0.44 - 1.00 mg/dL   GFR calc non Af Amer 43 (L) >60 mL/min   GFR calc Af Amer 49 (L) >60 mL/min    Comment: (NOTE) The eGFR has been calculated using the CKD EPI equation. This calculation has not been validated in all clinical situations. eGFR's persistently <60 mL/min signify possible Chronic Kidney Disease.   CBC WITH DIFFERENTIAL     Status: Abnormal   Collection Time: 04/13/15  5:46 AM  Result Value Ref Range   WBC 8.7 4.0 - 10.5 K/uL   RBC 4.47 3.87 - 5.11 MIL/uL   Hemoglobin 11.7 (L) 12.0 - 15.0 g/dL   HCT 34.2 (L) 36.0 - 46.0 %   MCV 76.5 (L) 78.0 - 100.0 fL   MCH 26.2 26.0 - 34.0 pg   MCHC 34.2  30.0 - 36.0 g/dL   RDW 17.1 (H) 11.5 - 15.5 %   Platelets 297 150 - 400 K/uL   Neutrophils Relative % 78 %   Neutro Abs 6.7 1.7 - 7.7 K/uL   Lymphocytes Relative 11 %   Lymphs Abs 1.0 0.7 - 4.0 K/uL   Monocytes Relative 11 %   Monocytes Absolute 1.0 0.1 - 1.0 K/uL   Eosinophils Relative 0 %   Eosinophils Absolute 0.0 0.0 - 0.7 K/uL   Basophils Relative 0 %   Basophils Absolute 0.0 0.0 - 0.1 K/uL  Comprehensive metabolic panel     Status: Abnormal   Collection Time: 04/13/15  5:46 AM  Result Value Ref Range   Sodium 139 135 - 145 mmol/L   Potassium 3.2 (L) 3.5 - 5.1 mmol/L   Chloride 108 101 - 111 mmol/L   CO2 23 22 - 32 mmol/L   Glucose, Bld 133 (H) 65 - 99 mg/dL   BUN 17 6 - 20 mg/dL   Creatinine, Ser 1.13 (H) 0.44 - 1.00 mg/dL   Calcium 8.9 8.9 - 10.3 mg/dL  Total Protein 6.4 (L) 6.5 - 8.1 g/dL   Albumin 2.5 (L) 3.5 - 5.0 g/dL   AST 13 (L) 15 - 41 U/L   ALT 8 (L) 14 - 54 U/L   Alkaline Phosphatase 71 38 - 126 U/L   Total Bilirubin 0.8 0.3 - 1.2 mg/dL   GFR calc non Af Amer 44 (L) >60 mL/min   GFR calc Af Amer 51 (L) >60 mL/min    Comment: (NOTE) The eGFR has been calculated using the CKD EPI equation. This calculation has not been validated in all clinical situations. eGFR's persistently <60 mL/min signify possible Chronic Kidney Disease.    Anion gap 8 5 - 15  Glucose, capillary     Status: Abnormal   Collection Time: 04/13/15  6:29 AM  Result Value Ref Range   Glucose-Capillary 123 (H) 65 - 99 mg/dL  Glucose, capillary     Status: Abnormal   Collection Time: 04/13/15 11:42 AM  Result Value Ref Range   Glucose-Capillary 188 (H) 65 - 99 mg/dL  Glucose, capillary     Status: Abnormal   Collection Time: 04/13/15  4:15 PM  Result Value Ref Range   Glucose-Capillary 135 (H) 65 - 99 mg/dL  Urinalysis, Routine w reflex microscopic (not at [facility])     Status: Abnormal   Collection Time: 04/13/15  6:44 PM  Result Value Ref Range   Color, Urine AMBER (A) YELLOW     Comment: BIOCHEMICALS MAY BE AFFECTED BY COLOR   APPearance CLEAR CLEAR   Specific Gravity, Urine 1.021 1.005 - 1.030   pH 5.5 5.0 - 8.0   Glucose, UA NEGATIVE NEGATIVE mg/dL   Hgb urine dipstick NEGATIVE NEGATIVE   Bilirubin Urine SMALL (A) NEGATIVE   Ketones, ur NEGATIVE NEGATIVE mg/dL   Protein, ur 100 (A) NEGATIVE mg/dL   Urobilinogen, UA 2.0 (H) 0.0 - 1.0 mg/dL   Nitrite NEGATIVE NEGATIVE   Leukocytes, UA NEGATIVE NEGATIVE  Urine microscopic-add on     Status: Abnormal   Collection Time: 04/13/15  6:44 PM  Result Value Ref Range   Squamous Epithelial / LPF RARE RARE   WBC, UA 0-2 <3 WBC/hpf   RBC / HPF 0-2 <3 RBC/hpf   Bacteria, UA RARE RARE   Casts HYALINE CASTS (A) NEGATIVE   Urine-Other AMORPHOUS URATES/PHOSPHATES   Glucose, capillary     Status: Abnormal   Collection Time: 04/13/15  8:57 PM  Result Value Ref Range   Glucose-Capillary 150 (H) 65 - 99 mg/dL  Glucose, capillary     Status: Abnormal   Collection Time: 04/14/15  6:47 AM  Result Value Ref Range   Glucose-Capillary 142 (H) 65 - 99 mg/dL     HEENT: normal Cardio: RRR and diastolic murmur Resp: CTA B/L and unlabored GI: BS positive and non distended Extremity:  No Edema Skin:   Intact Neuro: Confused, Flat, Cranial Nerve II-XII normal, Normal Sensory, Abnormal Motor 3- Left delt bi tri 2-grip 3- L HF, 4- KE , 3- ankle DF, 5/5 on Right side and Abnormal FMC Ataxic/ dec FMC Musc/Skel:  Other no pain with UE or LE ROM Gen NAD   Assessment/Plan: 1. Functional deficits secondary to right caudate intercerebral hemorrhage secondary to hypertensive crisis with left hemiparesis which require 3+ hours per day of interdisciplinary therapy in a comprehensive inpatient rehab setting. Physiatrist is providing close team supervision and 24 hour management of active medical problems listed below. Physiatrist and rehab team continue to assess barriers to discharge/monitor patient progress  toward functional and medical  goals. FIM: Function - Bathing Position: Wheelchair/chair at sink Body parts bathed by patient: Chest, Abdomen, Right upper leg, Left upper leg Body parts bathed by helper: Right arm, Left arm, Right lower leg, Left lower leg, Back Bathing not applicable: Front perineal area, Buttocks (pt has been cleansed prior to session)  Function- Upper Body Dressing/Undressing What is the patient wearing?: Bra, Button up shirt Bra - Perfomed by helper: Thread/unthread right bra strap, Thread/unthread left bra strap, Hook/unhook bra (pull down sports bra) Button up shirt - Perfomed by patient: Thread/unthread right sleeve Button up shirt - Perfomed by helper: Thread/unthread left sleeve, Pull shirt around back, Button/unbutton shirt Function - Lower Body Dressing/Undressing What is the patient wearing?: Pants, Non-skid slipper socks Position: Wheelchair/chair at sink Pants- Performed by helper: Thread/unthread right pants leg, Thread/unthread left pants leg, Pull pants up/down Non-skid slipper socks- Performed by helper: Don/doff right sock, Don/doff left sock  Function - Toileting Toileting steps completed by helper: Adjust clothing prior to toileting, Adjust clothing after toileting, Performs perineal hygiene Toileting Assistive Devices: Other (comment) (bed pan) Assist level: More than reasonable time, Two helpers (poor bed mobility due to weakness)  Function - Toilet Transfers Toilet transfer assistive device: Bedside commode, Drop arm commode, Grab bar, Walker, Elevated toilet seat/BSC over toilet Assist level to toilet: Maximal assist (Pt 25 - 49%/lift and lower) Assist level from toilet: Maximal assist (Pt 25 - 49%/lift and lower) Assist level to bedside commode (at bedside): Maximal assist (Pt 25 - 49%/lift and lower) Assist level from bedside commode (at bedside): Maximal assist (Pt 25 - 49%/lift and lower)  Function - Chair/bed transfer Chair/bed transfer method: Stand pivot Chair/bed  transfer assist level: Maximal assist (Pt 25 - 49%/lift and lower) Chair/bed transfer assistive device: Armrests Chair/bed transfer details: Manual facilitation for weight shifting, Manual facilitation for placement, Verbal cues for precautions/safety, Verbal cues for technique  Function - Locomotion: Wheelchair Will patient use wheelchair at discharge?: Yes Type: Manual Max wheelchair distance: 30' Assist Level: Total assistance (Pt < 25%) Wheel 50 feet with 2 turns activity did not occur: Safety/medical concerns Wheel 150 feet activity did not occur: Safety/medical concerns Turns around,maneuvers to table,bed, and toilet,negotiates 3% grade,maneuvers on rugs and over doorsills: No Function - Locomotion: Ambulation Assistive device: Walker-rolling Max distance: 2' Assist level: Maximal assist (Pt 25 - 49%) (max A progressing to mod A) Assist level: Maximal assist (Pt 25 - 49%) Assist level: Maximal assist (Pt 25 - 49%) Walk 150 feet activity did not occur: Safety/medical concerns Assist level: Maximal assist (Pt 25 - 49%)  Function - Comprehension Comprehension: Auditory Comprehension assist level: Understands basic 25 - 49% of the time/ requires cueing 50 - 75% of the time  Function - Expression Expression: Verbal Expression assist level: Expresses basic 25 - 49% of the time/requires cueing 50 - 75% of the time. Uses single words/gestures.  Function - Social Interaction Social Interaction assist level: Interacts appropriately 50 - 74% of the time - May be physically or verbally inappropriate.  Function - Problem Solving Problem solving assist level: Solves basic less than 25% of the time - needs direction nearly all the time or does not effectively solve problems and may need a restraint for safety  Function - Memory Memory assist level: Recognizes or recalls less than 25% of the time/requires cueing greater than 75% of the time Patient normally able to recall (first 3 days  only): Current season  Medical Problem List and Plan: 1. Functional  deficits secondary to right caudate intercerebral hemorrhage secondary to hypertensive crisis 2. DVT Prophylaxis/Anticoagulation: Subcutaneous heparin added for DVT prophylaxis 04/08/2015 3. Pain Management: Tylenol as needed 4. Hypertension. Clonidine 0.2 mg 3 times a day, Cardizem 240 mg daily, lisinopril 20 mg twice a day, Lopressor 25 mg twice a day. Will allow a degree of "permissive hypertension" to maintain perfusion, but needs better control.168/82- above desired range this am but generally ok 5. Neuropsych: This patient is capable of making decisions on her own behalf. 6. Skin/Wound Care: Routine skin checks 7. Fluids/Electrolytes/Nutrition: Routine I&O with follow-up chemistries, meal intake 20-50% 8. Systolic congestive heart failure. Check daily weights and I's and O's. Patient on Lasix 40 mg daily prior to admission. Resume as needed 9. Atrial fibrillation. Chronic Coumadin discontinued secondary to intracerebral hemorrhage. Cardiac rate controlled. Continue Cardizem and Lopressor 10. Diabetes mellitus peripheral neuropathy. Hemoglobin A1c 5.9. Sliding scale insulin. Check blood sugars before meals and at bedtime. Patient on Glucophage 500 mg twice a day prior to admission. Resume depending on cbg patterns.am glucose is 142 11. Chronic renal insufficiency. Baseline creatinine 1.35. Follow-up chemistries in am. Past labs all reviewed. 12. Hypothyroidism. TSH level 2.053. Continue Synthroid 13. History of gout. Zyloprim 100 mg daily. Not active currently 14. Hyperlipidemia. Resume statin at discharge. 15.  Hypokalemia- start KCL and do f/u BMET in am LOS (Days) 2 A FACE TO [place] E 04/14/2015, 7:04 AM

## 2015-04-14 NOTE — Progress Notes (Signed)
Occupational Therapy Session Note  Patient Details  Name: Nicole [name] MRN: [id number] Date of Birth: Nov 04, 1930  Today's Date: 04/14/2015 OT Individual Time: 1045-1200 OT Individual Time Calculation (min): 75 min    Short Term Goals: Week 1:  OT Short Term Goal 1 (Week 1): Pt will be able to transfer to toilet with min A. OT Short Term Goal 2 (Week 1): Pt will be able to toilet with min A. OT Short Term Goal 3 (Week 1): Pt will be able to don shirt with min A. OT Short Term Goal 4 (Week 1): Pt will don pants with mod A. OT Short Term Goal 5 (Week 1): Pt will actively use LUE to wash R arm with min cues.  Skilled Therapeutic Interventions/Progress Updates: ADL-retraining with focus on improved initiation, sequencing, attention, standing balance, and improved coordination at [place].   Pt received asleep in bed but aroused with min stimulation.   Pt required extra time to orient to situation, place, day, and time but agreed to participate in BADL when presented with clothing choices.   Pt required significant physical and cognitive assist throughout session d/t delayed motor planning however pt moves all four extremities when required for each task of BADL.   Pt able to complete SPT from EOB to w/c and maintain standing balance to allow therapist to wash her buttocks, inspect skin, and assist with donning underwear and pants.    Pt's sisters arrived near end of session and their presence provoked improved interaction and alertness.  Pt left at sink with RN tech assess BG and assisting pt with donning her pants.     Therapy Documentation Precautions:  Precautions Precautions: Fall Precaution Comments: L hemiplegia Restrictions Weight Bearing Restrictions: No  Pain: Pain Assessment Pain Assessment: No/denies pain  See Function Navigator for Current Functional Status.   Therapy/Group: Individual Therapy  [place] 04/14/2015, 12:25 PM

## 2015-04-14 NOTE — Plan of Care (Signed)
Problem: RH BLADDER ELIMINATION Goal: RH STG MANAGE BLADDER WITH ASSISTANCE STG Manage Bladder with total assist. In and out cath every 8 hrs. Or if volume greater than 350 ml

## 2015-04-14 NOTE — Progress Notes (Addendum)
Speech Language Pathology Daily Session Note  Patient Details  Name: Nicole [name] MRN: [id number] Date of Birth: 12-27-1930  Today's Date: 04/14/2015 SLP Individual Time: 7824-2353 SLP Individual Time Calculation (min): 46 min  Short Term Goals: Week 1: SLP Short Term Goal 1 (Week 1): Patient will utilize external aids/cues for orientation to place, time and situation with Max A multimodal cues.  SLP Short Term Goal 2 (Week 1): Patient will demonstrate sustained attention to functional tasks for 2 minutes with Max A multimodal cues.  SLP Short Term Goal 3 (Week 1): Patient will identify 1 physical and 1 cognitive deficit with Max A multimodal cues.  SLP Short Term Goal 4 (Week 1): Patient will initiate functional tasks in 50% of opportunities with Max A multimodal cues.  SLP Short Term Goal 5 (Week 1): Patient will consume current diet with minimal overt s/s of aspiration with Max A multimodal cues for use of swallow strategies.  SLP Short Term Goal 6 (Week 1): Patient will demonstrate efficient mastication of Dys. 2 textures with minimal overt s/s of aspiration across 3 sessions to assess readiness for upgrade with Max A multimodal cues.   Skilled Therapeutic Interventions:  Pt was seen for skilled ST targeting goals for cognition and dysphagia.  Upon arrival, pt was seated upright in chair, awake, confused, but agreeable to participate in ST with encouragement.  Pt required mod-max assist multimodal cues to sustain attention to set up tray and problem solve opening containers.  Pt was oriented to place and situation with min assist question cues but continues to present with language of confusion.  Pt consumed dys 1 textures and thin liquids with mod verbal cues to attend to bolused and initiate a timely swallow as pt was noted with oral holding which resulted in delayed coughing and/or throat clearing with purees and liquids.  With the abovementioned interventions, s/s of aspiration were  minimized to only intermittent throat clearing.  Husband was present and educated on rationale behind currently prescribed diet and swallowing precautions.  Pt was left in chair to finish breakfast with husband and nurse tech at bedside.  Continue per current plan of care.     Function:  Eating Eating   Modified Consistency Diet: Yes Eating Assist Level: More than reasonable amount of time;Set up assist for;Supervision or verbal cues;Help with picking up utensils;Help managing cup/glass   Eating Set Up Assist For: Opening containers       Cognition Comprehension Comprehension assist level: Understands basic 50 - 74% of the time/ requires cueing 25 - 49% of the time  Expression   Expression assist level: Expresses basic 50 - 74% of the time/requires cueing 25 - 49% of the time. Needs to repeat parts of sentences.  Social Interaction Social Interaction assist level: Interacts appropriately 50 - 74% of the time - May be physically or verbally inappropriate.  Problem Solving Problem solving assist level: Solves basic less than 25% of the time - needs direction nearly all the time or does not effectively solve problems and may need a restraint for safety  Memory Memory assist level: Recognizes or recalls less than 25% of the time/requires cueing greater than 75% of the time    Pain Pain Assessment Pain Assessment: No/denies pain  Therapy/Group: Individual Therapy  [place], Selinda [place] 04/14/2015, 10:09 AM

## 2015-04-14 NOTE — Progress Notes (Signed)
Occupational Therapy Session Note  Patient Details  Name: Nicole [name] MRN: [id number] Date of Birth: [birth year]  Today's Date: 04/14/2015 OT Individual Time: 1515-1600 OT Individual Time Calculation (min): 45 min    Short Term Goals: Week 1:  OT Short Term Goal 1 (Week 1): Pt will be able to transfer to toilet with min A. OT Short Term Goal 2 (Week 1): Pt will be able to toilet with min A. OT Short Term Goal 3 (Week 1): Pt will be able to don shirt with min A. OT Short Term Goal 4 (Week 1): Pt will don pants with mod A. OT Short Term Goal 5 (Week 1): Pt will actively use LUE to wash R arm with min cues.  Skilled Therapeutic Interventions/Progress Updates:  Upon entering the room, pt supine in bed sleeping with RN and son present in room. Pt agreeable to OT intervention. Supine >sit with mod A to EOB. Pt seated on EOB with min A for balance progressing to supervision while weight bearing into L UE and RN providing medication. Pt required max A stand pivot transfer into wheelchair and manual facilitation for anterior weight shift. Pt also attempting to grasp and hold onto bed rail during transfer. OT propelled pt via wheelchair into bathroom for stand pivot onto elevated toilet seat with mod A for standing balance with assistance for clothing management and hygiene. Pt returning to wheelchair in same manner and remained in wheelchair at end of session. Son present in room and requesting to leave off quick release belt. Therapist educated son that it must be on when he leaves the room. Call bell and all needed items within reach upon exiting the room.   Therapy Documentation Precautions:  Precautions Precautions: Fall Precaution Comments: L hemiplegia Restrictions Weight Bearing Restrictions: No General:   Vital Signs: Therapy Vitals Temp: 97.6 F (36.4 C) Temp Source: Oral Pulse Rate: 64 Resp: 18 BP: (!) 156/78 mmHg Patient Position (if appropriate): Lying Oxygen  Therapy SpO2: 99 % O2 Device: Not Delivered Pain: Pain Assessment Pain Assessment: No/denies pain  See Function Navigator for Current Functional Status.   Therapy/Group: Individual Therapy  Phineas Semen 04/14/2015, 4:21 PM

## 2015-04-14 NOTE — Progress Notes (Signed)
Physical Therapy Session Note  Patient Details  Name: Nicole [name] MRN: [id number] Date of Birth: 11-Apr-1931  Today's Date: 04/14/2015 PT Individual Time: 1330-1400 PT Individual Time Calculation (min): 30 min   Short Term Goals: Week 1:  PT Short Term Goal 1 (Week 1): Pt will demonstrate side lie to sit req mod A consistently.  PT Short Term Goal 2 (Week 1): Pt will demonstrate sit to stand req min A.  PT Short Term Goal 3 (Week 1): Pt will demonstrate stand-step transfer req mod A.  PT Short Term Goal 4 (Week 1): Pt will ambulate 100' req mod A with RW PT Short Term Goal 5 (Week 1): Pt will demonstrate w/c propulsion x 50' req max A.   Skilled Therapeutic Interventions/Progress Updates:    Pt received up in w/c with S.O. Nadara Mustard present - pt lethargic, but agreeable to PT. Therapeutic Activity - see function tab for details. Orthostatic vitals taken in supine, sit, and immediate standing - no orthostatic drop noted. PT instructs pt in repeated sit to stands from eob without AD x 5 reps req min-mod A - focus on forward weight shift. Pt ended in bed to rest due to fatigue - bed alarm on and all needs in reach.   Therapy Documentation Precautions:  Precautions Precautions: Fall Precaution Comments: L hemiplegia Restrictions Weight Bearing Restrictions: No Vital Signs: see flowsheet for orthostatic vitals Therapy Vitals Pulse Rate: 67 BP: (!) 163/98 mmHg Patient Position (if appropriate): Orthostatic Vitals Oxygen Therapy SpO2: 96 % O2 Device: Not Delivered Pain: Pain Assessment Pain Assessment: No/denies pain  See Function Navigator for Current Functional Status.   Therapy/Group: Individual Therapy  HYSLOP,AMANDA M 04/14/2015, 2:13 PM

## 2015-04-14 NOTE — IPOC Note (Signed)
Overall Plan of Care [facility]) Patient Details Name: Nicole [name] MRN: [id number] DOB: 1930-10-23  Admitting Diagnosis: CVA  Hospital Problems: Principal Problem:   ICH (intracerebral hemorrhage) Active Problems:   Left hemiparesis   Chronic diastolic congestive heart failure   Persistent atrial fibrillation   Type 2 diabetes mellitus with peripheral neuropathy     Functional Problem List: Nursing Bladder, Bowel  PT Balance, Behavior, Endurance, Motor, Pain, Perception, Safety, Sensory  OT Balance, Cognition, Endurance, Motor, Sensory, Safety, Perception, Vision  SLP Cognition, Nutrition  TR         Basic ADL's: OT Eating, Grooming, Bathing, Dressing, Toileting     Advanced  ADL's: OT       Transfers: PT Bed Mobility, Bed to Chair, Car, Manufacturing systems engineer, Metallurgist: PT Ambulation, Emergency planning/management officer, Stairs     Additional Impairments: OT Fuctional Use of Upper Extremity  SLP Swallowing, Social Cognition   Social Interaction, Awareness, Problem Solving, Memory, Attention  TR      Anticipated Outcomes Item Anticipated Outcome  Self Feeding mod I  Swallowing  Min A with least restrictive diet    Basic self-care  supervision  Toileting  supervision   Bathroom Transfers supervision  Bowel/Bladder  min assist  Transfers  SBA ambulation  Locomotion  SBA ambulation  Communication     Cognition  Min-Mod A   Pain  pain less than or equal to 2  Safety/Judgment  no unsafe behavior   Therapy Plan: PT Intensity: Minimum of 1-2 x/day ,45 to 90 minutes PT Frequency: 5 out of 7 days PT Duration Estimated Length of Stay: 3 weeks OT Intensity: Minimum of 1-2 x/day, 45 to 90 minutes OT Frequency: 5 out of 7 days OT Duration/Estimated Length of Stay: 20-22 days SLP Intensity: Minumum of 1-2 x/day, 30 to 90 minutes SLP Frequency: 3 to 5 out of 7 days SLP Duration/Estimated Length of Stay: 3 weeks       Team Interventions: Nursing  Interventions Patient/Family Education, Skin Care/Wound Management, Pain Management, Cognitive Remediation/Compensation, Bowel Management, Medication Management  PT interventions Ambulation/gait training, DME/adaptive equipment instruction, Psychosocial support, UE/LE Strength taining/ROM, Balance/vestibular training, Functional electrical stimulation, UE/LE Coordination activities, Cognitive remediation/compensation, Functional mobility training, Splinting/orthotics, Visual/perceptual remediation/compensation, Community reintegration, Neuromuscular re-education, IT trainer, Wheelchair propulsion/positioning, Discharge planning, Pain management, Therapeutic Activities, Disease management/prevention, Barrister's clerk education, Therapeutic Exercise  OT Interventions Training and development officer, Discharge planning, Functional mobility training, Cognitive remediation/compensation, Patient/family education, Self Care/advanced ADL retraining, Therapeutic Exercise, UE/LE Strength taining/ROM, Visual/perceptual remediation/compensation, UE/LE Coordination activities, Neuromuscular re-education, Therapeutic Activities  SLP Interventions Cognitive remediation/compensation, Cueing hierarchy, Dysphagia/aspiration precaution training, Environmental controls, Functional tasks, Internal/external aids, Patient/family education, Therapeutic Activities  TR Interventions    SW/CM Interventions Discharge Planning, Psychosocial Support, Patient/Family Education    Team Discharge Planning: Destination: PT-Home (pending 24 availability of family and progress) ,OT- Home , SLP-Home Projected Follow-up: PT-Home health PT, Skilled nursing facility (pending progress and discharge location), OT-  Home health OT, SLP-Home Health SLP, Outpatient SLP, 24 hour supervision/assistance Projected Equipment Needs: PT-To be determined, OT- Tub/shower bench, SLP-None recommended by SLP Equipment Details: PT-Pt owns a SPC, OT-   Patient/family involved in discharge planning: PT- Patient,  OT-Patient, Family member/caregiver, SLP-Patient unable/family or caregive not available  MD ELOS: 12-18d Medical Rehab Prognosis:  Good Assessment: 81 y.o. right handed female with history of hypertension, systolic congestive heart failure, diabetes mellitus peripheral neuropathy, chronic renal insufficiency with baseline creatinine 1.35, atrial fibrillation on chronic Coumadin. Patient independent prior to  admission still driving use a single-point cane living alone. Presented 04/04/2015 with altered mental status as well as headache. Blood pressure 166/103. INR 3.6. CT of the head showed a large acute hemorrhage centered in the region of the right caudate nucleus with rupture into the lateral ventricle and evidence of developing hydrocephalus. Parenchymal hemorrhage measuring roughly 3.2 x 4.6 x 3.0 cm. Neurosurgery Dr. Saintclair Halsted consulted advise conservative care. Coumadin was reversed. Follow-up serial cranial CT scans 04/07/2015 showing evolving right basal ganglia hematoma with intraventricular extension   Now requiring 24/7 Rehab RN,MD, as well as CIR level PT, OT and SLP.  Treatment team will focus on ADLs and mobility with goals set at [place]  See Team Conference Notes for weekly updates to the plan of care

## 2015-04-15 ENCOUNTER — Inpatient Hospital Stay (HOSPITAL_COMMUNITY): Payer: Medicare Other | Admitting: Physical Therapy

## 2015-04-15 ENCOUNTER — Inpatient Hospital Stay (HOSPITAL_COMMUNITY): Payer: Medicare Other | Admitting: Speech Pathology

## 2015-04-15 ENCOUNTER — Inpatient Hospital Stay (HOSPITAL_COMMUNITY): Payer: Medicare Other

## 2015-04-15 LAB — BASIC METABOLIC PANEL
ANION GAP: 8 (ref 5–15)
BUN: 17 mg/dL (ref 6–20)
CHLORIDE: 109 mmol/L (ref 101–111)
CO2: 24 mmol/L (ref 22–32)
Calcium: 9.1 mg/dL (ref 8.9–10.3)
Creatinine, Ser: 1.12 mg/dL — ABNORMAL HIGH (ref 0.44–1.00)
GFR calc non Af Amer: 44 mL/min — ABNORMAL LOW (ref >60)
GFR, EST AFRICAN AMERICAN: 52 mL/min — AB (ref >60)
Glucose, Bld: 158 mg/dL — ABNORMAL HIGH (ref 65–99)
POTASSIUM: 3.2 mmol/L — AB (ref 3.5–5.1)
Sodium: 141 mmol/L (ref 135–145)

## 2015-04-15 LAB — URINE CULTURE: Culture: NO GROWTH

## 2015-04-15 LAB — GLUCOSE, CAPILLARY
GLUCOSE-CAPILLARY: 139 mg/dL — AB (ref 65–99)
GLUCOSE-CAPILLARY: 160 mg/dL — AB (ref 65–99)
Glucose-Capillary: 151 mg/dL — ABNORMAL HIGH (ref 65–99)
Glucose-Capillary: 244 mg/dL — ABNORMAL HIGH (ref 65–99)

## 2015-04-15 MED ORDER — SENNOSIDES-DOCUSATE SODIUM 8.6-50 MG PO TABS
2.0000 | ORAL_TABLET | Freq: Two times a day (BID) | ORAL | Status: DC
  Administered 2015-04-15 – 2015-05-02 (×33): 2 via ORAL
  Filled 2015-04-15 (×35): qty 2

## 2015-04-15 NOTE — Progress Notes (Signed)
MD Naaman Plummer asked for a urine sample of the next output. RN to pass on to night RN

## 2015-04-15 NOTE — Progress Notes (Signed)
Patient was bladder scanned- tech saw residual of 130cc- RN and tech cath patient- received 200cc of cloudy, foul smelling urine. MD on call to be paged. Will continue to monitor

## 2015-04-15 NOTE — Progress Notes (Signed)
Speech Language Pathology Daily Session Note  Patient Details  Name: Nicole [name] MRN: [id number] Date of Birth: [birth year]  Today's Date: 04/15/2015 SLP Individual Time: 9628-3662 SLP Individual Time Calculation (min): 23 min  Short Term Goals: Week 1: SLP Short Term Goal 1 (Week 1): Patient will utilize external aids/cues for orientation to place, time and situation with Max A multimodal cues.  SLP Short Term Goal 2 (Week 1): Patient will demonstrate sustained attention to functional tasks for 2 minutes with Max A multimodal cues.  SLP Short Term Goal 3 (Week 1): Patient will identify 1 physical and 1 cognitive deficit with Max A multimodal cues.  SLP Short Term Goal 4 (Week 1): Patient will initiate functional tasks in 50% of opportunities with Max A multimodal cues.  SLP Short Term Goal 5 (Week 1): Patient will consume current diet with minimal overt s/s of aspiration with Max A multimodal cues for use of swallow strategies.  SLP Short Term Goal 6 (Week 1): Patient will demonstrate efficient mastication of Dys. 2 textures with minimal overt s/s of aspiration across 3 sessions to assess readiness for upgrade with Max A multimodal cues.   Skilled Therapeutic Interventions:  Pt was seen for skilled ST targeting cognitive goals. Upon arrival, pt was asleep in bed and only briefly arousable to sternal rub and cold compress.  Pt with slightly improved alertness with repositioning at edge of bed and constant tactile and kinetic stimulation but continued to present with only minimal verbal responses to SLP.  Pt reported seeing "puppy dogs" in the room; therefore, SLP reoriented pt to place.  Suspect level of alertness and increased confusion to be related to ?UTI.  Pt was returned to supine and left in bed with call bell within reach and bed alarm activated.  Continue per current plan of care.    Function:  Eating Eating                 Cognition Comprehension Comprehension assist  level: Understands basic 50 - 74% of the time/ requires cueing 25 - 49% of the time  Expression   Expression assist level: Expresses basic 50 - 74% of the time/requires cueing 25 - 49% of the time. Needs to repeat parts of sentences.  Social Interaction Social Interaction assist level: Interacts appropriately 25 - 49% of time - Needs frequent redirection.  Problem Solving Problem solving assist level: Solves basic 25 - 49% of the time - needs direction more than half the time to initiate, plan or complete simple activities  Memory Memory assist level: Recognizes or recalls less than 25% of the time/requires cueing greater than 75% of the time    Pain Pain Assessment Pain Assessment: No/denies pain  Therapy/Group: Individual Therapy  Page, Selinda Orion 04/15/2015, 4:32 PM

## 2015-04-15 NOTE — Progress Notes (Signed)
Patient seems to be very drowsy with RN and tech- tried to feed patient but she continues to fall asleep. Patient is however able to wake up and say a few words but then she ends up falling back asleep. Will continue to monitor

## 2015-04-15 NOTE — Progress Notes (Signed)
Patient ID: Nicole [name], female   DOB: 1931/06/21, 81 y.o.   MRN: [id number]   Subjective/Complaints: Oriented to hospital and rehab but not [facility] ROS- neg bowel, bladder breathing or pain issues Objective: Vital Signs: Blood pressure 159/72, pulse 67, temperature 97.9 F (36.6 C), temperature source Oral, resp. rate 16, height 5' 9"  (1.753 m), weight 79.85 kg (176 lb 0.6 oz), SpO2 97 %. No results found. Results for orders placed or performed during the hospital encounter of 04/12/15 (from the past 72 hour(s))  Glucose, capillary     Status: Abnormal   Collection Time: 04/12/15  8:52 PM  Result Value Ref Range   Glucose-Capillary 107 (H) 65 - 99 mg/dL  CBC     Status: Abnormal   Collection Time: 04/12/15  9:15 PM  Result Value Ref Range   WBC 10.8 (H) 4.0 - 10.5 K/uL   RBC 4.54 3.87 - 5.11 MIL/uL   Hemoglobin 12.2 12.0 - 15.0 g/dL   HCT 34.9 (L) 36.0 - 46.0 %   MCV 76.9 (L) 78.0 - 100.0 fL   MCH 26.9 26.0 - 34.0 pg   MCHC 35.0 30.0 - 36.0 g/dL   RDW 17.2 (H) 11.5 - 15.5 %   Platelets 259 150 - 400 K/uL  Creatinine, serum     Status: Abnormal   Collection Time: 04/12/15  9:15 PM  Result Value Ref Range   Creatinine, Ser 1.16 (H) 0.44 - 1.00 mg/dL   GFR calc non Af Amer 43 (L) >60 mL/min   GFR calc Af Amer 49 (L) >60 mL/min    Comment: (NOTE) The eGFR has been calculated using the CKD EPI equation. This calculation has not been validated in all clinical situations. eGFR's persistently <60 mL/min signify possible Chronic Kidney Disease.   CBC WITH DIFFERENTIAL     Status: Abnormal   Collection Time: 04/13/15  5:46 AM  Result Value Ref Range   WBC 8.7 4.0 - 10.5 K/uL   RBC 4.47 3.87 - 5.11 MIL/uL   Hemoglobin 11.7 (L) 12.0 - 15.0 g/dL   HCT 34.2 (L) 36.0 - 46.0 %   MCV 76.5 (L) 78.0 - 100.0 fL   MCH 26.2 26.0 - 34.0 pg   MCHC 34.2 30.0 - 36.0 g/dL   RDW 17.1 (H) 11.5 - 15.5 %   Platelets 297 150 - 400 K/uL   Neutrophils Relative % 78 %   Neutro Abs 6.7 1.7 - 7.7  K/uL   Lymphocytes Relative 11 %   Lymphs Abs 1.0 0.7 - 4.0 K/uL   Monocytes Relative 11 %   Monocytes Absolute 1.0 0.1 - 1.0 K/uL   Eosinophils Relative 0 %   Eosinophils Absolute 0.0 0.0 - 0.7 K/uL   Basophils Relative 0 %   Basophils Absolute 0.0 0.0 - 0.1 K/uL  Comprehensive metabolic panel     Status: Abnormal   Collection Time: 04/13/15  5:46 AM  Result Value Ref Range   Sodium 139 135 - 145 mmol/L   Potassium 3.2 (L) 3.5 - 5.1 mmol/L   Chloride 108 101 - 111 mmol/L   CO2 23 22 - 32 mmol/L   Glucose, Bld 133 (H) 65 - 99 mg/dL   BUN 17 6 - 20 mg/dL   Creatinine, Ser 1.13 (H) 0.44 - 1.00 mg/dL   Calcium 8.9 8.9 - 10.3 mg/dL   Total Protein 6.4 (L) 6.5 - 8.1 g/dL   Albumin 2.5 (L) 3.5 - 5.0 g/dL   AST 13 (L) 15 - 41  U/L   ALT 8 (L) 14 - 54 U/L   Alkaline Phosphatase 71 38 - 126 U/L   Total Bilirubin 0.8 0.3 - 1.2 mg/dL   GFR calc non Af Amer 44 (L) >60 mL/min   GFR calc Af Amer 51 (L) >60 mL/min    Comment: (NOTE) The eGFR has been calculated using the CKD EPI equation. This calculation has not been validated in all clinical situations. eGFR's persistently <60 mL/min signify possible Chronic Kidney Disease.    Anion gap 8 5 - 15  Glucose, capillary     Status: Abnormal   Collection Time: 04/13/15  6:29 AM  Result Value Ref Range   Glucose-Capillary 123 (H) 65 - 99 mg/dL  Glucose, capillary     Status: Abnormal   Collection Time: 04/13/15 11:42 AM  Result Value Ref Range   Glucose-Capillary 188 (H) 65 - 99 mg/dL  Glucose, capillary     Status: Abnormal   Collection Time: 04/13/15  4:15 PM  Result Value Ref Range   Glucose-Capillary 135 (H) 65 - 99 mg/dL  Urinalysis, Routine w reflex microscopic (not at [facility])     Status: Abnormal   Collection Time: 04/13/15  6:44 PM  Result Value Ref Range   Color, Urine AMBER (A) YELLOW    Comment: BIOCHEMICALS MAY BE AFFECTED BY COLOR   APPearance CLEAR CLEAR   Specific Gravity, Urine 1.021 1.005 - 1.030   pH 5.5 5.0 - 8.0    Glucose, UA NEGATIVE NEGATIVE mg/dL   Hgb urine dipstick NEGATIVE NEGATIVE   Bilirubin Urine SMALL (A) NEGATIVE   Ketones, ur NEGATIVE NEGATIVE mg/dL   Protein, ur 100 (A) NEGATIVE mg/dL   Urobilinogen, UA 2.0 (H) 0.0 - 1.0 mg/dL   Nitrite NEGATIVE NEGATIVE   Leukocytes, UA NEGATIVE NEGATIVE  Urine microscopic-add on     Status: Abnormal   Collection Time: 04/13/15  6:44 PM  Result Value Ref Range   Squamous Epithelial / LPF RARE RARE   WBC, UA 0-2 <3 WBC/hpf   RBC / HPF 0-2 <3 RBC/hpf   Bacteria, UA RARE RARE   Casts HYALINE CASTS (A) NEGATIVE   Urine-Other AMORPHOUS URATES/PHOSPHATES   Glucose, capillary     Status: Abnormal   Collection Time: 04/13/15  8:57 PM  Result Value Ref Range   Glucose-Capillary 150 (H) 65 - 99 mg/dL  Glucose, capillary     Status: Abnormal   Collection Time: 04/14/15  6:47 AM  Result Value Ref Range   Glucose-Capillary 142 (H) 65 - 99 mg/dL  Glucose, capillary     Status: Abnormal   Collection Time: 04/14/15 12:08 PM  Result Value Ref Range   Glucose-Capillary 134 (H) 65 - 99 mg/dL  Glucose, capillary     Status: Abnormal   Collection Time: 04/14/15  4:41 PM  Result Value Ref Range   Glucose-Capillary 121 (H) 65 - 99 mg/dL  Glucose, capillary     Status: Abnormal   Collection Time: 04/14/15  8:56 PM  Result Value Ref Range   Glucose-Capillary 143 (H) 65 - 99 mg/dL     HEENT: normal Cardio: RRR and diastolic murmur Resp: CTA B/L and unlabored GI: BS positive and non distended Extremity:  No Edema Skin:   Intact Neuro: Confused, Flat, Cranial Nerve II-XII normal, Normal Sensory, Abnormal Motor 3- Left delt bi tri 2-grip 3- L HF, 4- KE , 3- ankle DF, 5/5 on Right side and Abnormal FMC Ataxic/ dec FMC Musc/Skel:  Other no pain with UE  or LE ROM Gen NAD   Assessment/Plan: 1. Functional deficits secondary to right caudate intercerebral hemorrhage secondary to hypertensive crisis with left hemiparesis which require 3+ hours per day of  interdisciplinary therapy in a comprehensive inpatient rehab setting. Physiatrist is providing close team supervision and 24 hour management of active medical problems listed below. Physiatrist and rehab team continue to assess barriers to discharge/monitor patient progress toward functional and medical goals. FIM: Function - Bathing Position: Wheelchair/chair at sink Body parts bathed by patient: Right arm, Left arm, Chest, Abdomen, Front perineal area, Right upper leg, Left upper leg Body parts bathed by helper: Left lower leg, Right lower leg, Back, Buttocks Bathing not applicable: Front perineal area, Buttocks (pt has been cleansed prior to session) Assist Level: Touching or steadying assistance(Pt > 75%)  Function- Upper Body Dressing/Undressing What is the patient wearing?: Bra, Pull over shirt/dress Bra - Perfomed by helper: Thread/unthread right bra strap, Thread/unthread left bra strap, Hook/unhook bra (pull down sports bra) Pull over shirt/dress - Perfomed by helper: Thread/unthread right sleeve, Thread/unthread left sleeve, Put head through opening, Pull shirt over trunk Button up shirt - Perfomed by patient: Thread/unthread right sleeve Button up shirt - Perfomed by helper: Thread/unthread left sleeve, Pull shirt around back, Button/unbutton shirt Assist Level: Touching or steadying assistance(Pt > 75%) Function - Lower Body Dressing/Undressing What is the patient wearing?: Underwear, Pants, Non-skid slipper socks Position: Wheelchair/chair at sink Underwear - Performed by helper: Thread/unthread right underwear leg, Thread/unthread left underwear leg, Pull underwear up/down Pants- Performed by helper: Thread/unthread right pants leg, Thread/unthread left pants leg, Pull pants up/down Non-skid slipper socks- Performed by helper: Don/doff right sock, Don/doff left sock Assist Level: Touching or steadying assistance (Pt > 75%)  Function - Toileting Toileting steps completed by  helper: Adjust clothing prior to toileting, Adjust clothing after toileting, Performs perineal hygiene Toileting Assistive Devices: Other (comment) (bed pan) Assist level:  (mod A balance)  Function - Toilet Transfers Toilet transfer assistive device: Elevated toilet seat/BSC over toilet, Grab bar Assist level to toilet: Maximal assist (Pt 25 - 49%/lift and lower) Assist level from toilet: Maximal assist (Pt 25 - 49%/lift and lower) Assist level to bedside commode (at bedside): Maximal assist (Pt 25 - 49%/lift and lower) Assist level from bedside commode (at bedside): Maximal assist (Pt 25 - 49%/lift and lower)  Function - Chair/bed transfer Chair/bed transfer method: Stand pivot Chair/bed transfer assist level: Moderate assist (Pt 50 - 74%/lift or lower) Chair/bed transfer assistive device: Walker, Armrests Chair/bed transfer details: Manual facilitation for weight shifting, Manual facilitation for placement, Verbal cues for precautions/safety, Verbal cues for technique, Verbal cues for sequencing  Function - Locomotion: Wheelchair Will patient use wheelchair at discharge?: Yes Type: Manual Max wheelchair distance: 30' Assist Level: Total assistance (Pt < 25%) Wheel 50 feet with 2 turns activity did not occur: Safety/medical concerns Wheel 150 feet activity did not occur: Safety/medical concerns Turns around,maneuvers to table,bed, and toilet,negotiates 3% grade,maneuvers on rugs and over doorsills: No Function - Locomotion: Ambulation Assistive device: Walker-rolling Max distance: 70' Assist level: Maximal assist (Pt 25 - 49%) (max A progressing to mod A) Assist level: Maximal assist (Pt 25 - 49%) Assist level: Maximal assist (Pt 25 - 49%) Walk 150 feet activity did not occur: Safety/medical concerns Assist level: Maximal assist (Pt 25 - 49%)  Function - Comprehension Comprehension: Auditory Comprehension assist level: Understands basic 50 - 74% of the time/ requires cueing 25 -  49% of the time  Function - Expression Expression:  Verbal Expression assist level: Expresses basic 50 - 74% of the time/requires cueing 25 - 49% of the time. Needs to repeat parts of sentences.  Function - Social Interaction Social Interaction assist level: Interacts appropriately 50 - 74% of the time - May be physically or verbally inappropriate.  Function - Problem Solving Problem solving assist level: Solves basic 25 - 49% of the time - needs direction more than half the time to initiate, plan or complete simple activities  Function - Memory Memory assist level: Recognizes or recalls less than 25% of the time/requires cueing greater than 75% of the time Patient normally able to recall (first 3 days only): Current season  Medical Problem List and Plan: 1. Functional deficits secondary to right caudate intercerebral hemorrhage secondary to hypertensive crisis 2. DVT Prophylaxis/Anticoagulation: Subcutaneous heparin added for DVT prophylaxis 04/08/2015 3. Pain Management: Tylenol as needed 4. Hypertension. Clonidine 0.2 mg 3 times a day, Cardizem 240 mg daily, lisinopril 20 mg twice a day, Lopressor 25 mg twice a day. Will allow a degree of "permissive hypertension" to maintain perfusion, am value 159/72-  5. Neuropsych: This patient is capable of making decisions on her own behalf. 6. Skin/Wound Care: Routine skin checks 7. Fluids/Electrolytes/Nutrition: Routine I&O with follow-up chemistries, meal intake 20-50% 8. Systolic congestive heart failure. Check daily weights and I's and O's. Patient on Lasix 40 mg daily prior to admission. Resume as needed currently fluid intake very low , minimal pedal edema 9. Atrial fibrillation. Chronic Coumadin discontinued secondary to intracerebral hemorrhage. Cardiac rate controlled. Continue Cardizem and Lopressor 10. Diabetes mellitus peripheral neuropathy. Hemoglobin A1c 5.9. Sliding scale insulin. Check blood sugars before meals and at bedtime.  Patient on Glucophage 500 mg twice a day prior to admission. Marland Kitchenam glucose is 143, range 120-150s, restart am glucophage 11. Chronic renal insufficiency. Baseline creatinine 1.35. Follow-up chemistries in am. Past labs all reviewed. 12. Hypothyroidism. TSH level 2.053. Continue Synthroid 13. History of gout. Zyloprim 100 mg daily. Not active currently 14. Hyperlipidemia. Resume statin at discharge. 15.  Hypokalemia- cont KCL and do f/u BMET LOS (Days) 3 A FACE TO FACE EVALUATION WAS PERFORMED  KIRSTEINS,ANDREW E 04/15/2015, 6:47 AM

## 2015-04-15 NOTE — Progress Notes (Signed)
Occupational Therapy Session Note  Patient Details  Name: Nicole [name] MRN: [id number] Date of Birth: 1930-08-19  Today's Date: 04/15/2015 OT Individual Time: 0930-1030 OT Individual Time Calculation (min): 60 min    Short Term Goals: Week 1:  OT Short Term Goal 1 (Week 1): Pt will be able to transfer to toilet with min A. OT Short Term Goal 2 (Week 1): Pt will be able to toilet with min A. OT Short Term Goal 3 (Week 1): Pt will be able to don shirt with min A. OT Short Term Goal 4 (Week 1): Pt will don pants with mod A. OT Short Term Goal 5 (Week 1): Pt will actively use LUE to wash R arm with min cues.  Skilled Therapeutic Interventions/Progress Updates: ADL-retraining with focus on improved awareness, attention, initiation, sequencing and functional use of LUE.    Pt received supine in bed, alert and more verbally interactive than during previous session.   Pt able to rise to EOB with overall max assist to place both legs and lift upper trunk with HOB elevated (pt = 60%).    Pt ambulated to sink to bathe with max assist for mobility with therapist providing manual facilitation to shift weight and advance left leg, although pt contributes approx 70% effort.   Pt bathed seated and standing to wash peri-area with max multimodal cues to progress and attend to task.   Pt demo'd ideomotor apraxia provoked when reaching for items at sink, often reaching for toothpaste and using it as deodorant, and often failing to execute movement required for given task.  As an example, pt washed countertop when cued to wash perineal area while standing supported and when presented with a washcloth. Pt dressed with max assist required to don bra, gown, socks, brief and extra time.   Pt left at sink as physical therapist arrived.     Therapy Documentation Precautions:  Precautions Precautions: Fall Precaution Comments: L hemiplegia Restrictions Weight Bearing Restrictions: No  Vital Signs: Therapy  Vitals Pulse Rate: 89 BP: (!) 154/82 mmHg   Pain: Pain Assessment Pain Assessment: No/denies pain Pain Score: 4  Pain Type: Acute pain Pain Location: Shoulder Pain Orientation: Left Pain Descriptors / Indicators: Aching Pain Onset: With Activity Pain Intervention(s): Rest Multiple Pain Sites: No  See Function Navigator for Current Functional Status.   Therapy/Group: Individual Therapy  [place] 04/15/2015, 12:27 PM

## 2015-04-15 NOTE — Progress Notes (Signed)
Physical Therapy Session Note  Patient Details  Name: Nicole [name] MRN: [id number] Date of Birth: 05-14-1931  Today's Date: 04/15/2015 PT Individual Time: 1030-1130 PT Individual Time Calculation (min): 60 min   Short Term Goals: Week 1:  PT Short Term Goal 1 (Week 1): Pt will demonstrate side lie to sit req mod A consistently.  PT Short Term Goal 2 (Week 1): Pt will demonstrate sit to stand req min A.  PT Short Term Goal 3 (Week 1): Pt will demonstrate stand-step transfer req mod A.  PT Short Term Goal 4 (Week 1): Pt will ambulate 100' req mod A with RW PT Short Term Goal 5 (Week 1): Pt will demonstrate w/c propulsion x 50' req max A.   Skilled Therapeutic Interventions/Progress Updates:    Pt received up in w/c, transitioning from OT. Gait Training - see function tab for details. Pt demonstrates very low initiation and motor impersistence with ambulation activity: req lateral weight shift assist from PT and forward progression of RW by PT during very slow gait - frequent stops by pt. W/C follow by pt's Elizabeth who came during this part of session. Stair negotiation req mod A from PT on ascent, progressing to max A from PT on descent, until last step when stair rail is short, req 2nd assist to bring L foot down and max A from PT to finish and turn and sit in w/c. W/C Management: PT instructs pt in w/c propulsion with B UEs req hand over hand assist - see function tab for details. Therapeutic Activity - see function tab for details. Pt has more difficulty utilizing RW due to motor task planning/sequencing skills, than during sit to stands and transfers with HHA. Pt demonstrates more retropulsion during functional mobility today and is beginning to emerge as a pusher towards weaker L side. Pt ended up in w/c with family present. Continue per PT POC.   Therapy Documentation Precautions:  Precautions Precautions: Fall Precaution Comments: L hemiplegia Restrictions Weight Bearing  Restrictions: No Pain: Pain Assessment Pain Assessment: No/denies pain Pain Score: 4  Pain Type: Acute pain Pain Location: Shoulder Pain Orientation: Left Pain Descriptors / Indicators: Aching Pain Onset: With Activity Pain Intervention(s): Rest Multiple Pain Sites: No   See Function Navigator for Current Functional Status.   Therapy/Group: Individual Therapy  HYSLOP,AMANDA M 04/15/2015, 12:19 PM

## 2015-04-15 NOTE — Progress Notes (Signed)
Physical Therapy Session Note  Patient Details  Name: Nicole [name] MRN: [id number] Date of Birth: Dec 21, 1930  Today's Date: 04/15/2015 PT Individual Time: 2836-6294 PT Individual Time Calculation (min): 54 min   Short Term Goals: Week 1:  PT Short Term Goal 1 (Week 1): Pt will demonstrate side lie to sit req mod A consistently.  PT Short Term Goal 2 (Week 1): Pt will demonstrate sit to stand req min A.  PT Short Term Goal 3 (Week 1): Pt will demonstrate stand-step transfer req mod A.  PT Short Term Goal 4 (Week 1): Pt will ambulate 100' req mod A with RW PT Short Term Goal 5 (Week 1): Pt will demonstrate w/c propulsion x 50' req max A.   Skilled Therapeutic Interventions/Progress Updates:   Pt received in w/c asleep; pt did not awaken to name or touch.  Continued to apply pressure to knee and hand with pt opening eyes with extra time.  Pt unable to remain awake for vestibular assessment; vitals assessed and RN present to administer medications.  Assisted with maintaining pt arousal and alertness to assist with safe swallow of medications and injection.  Performed sit>stand from w/c mod-max A to bring COG over BOS; while attempting stand pivot with RW pt experienced freezing episode and unable to initiate stepping despite verbal, visual and tactile cues.  Required extra time, total A weight shifting and assistance to initiate stepping with each LE.  Once seated EOB with L hip wedged to facilitate weight shift to R engaged pt in hand under hand facilitation of self feeding; when hand under hand with drawn pt unable initiate self feeding and drinking.  Due to fatigue and decreased arousal pt transferred from sit > supine with total A.  Pt left in bed with all items within reach and bed alarm on.    Therapy Documentation Precautions:  Precautions Precautions: Fall Precaution Comments: L hemiplegia Restrictions Weight Bearing Restrictions: No Vital Signs: Therapy Vitals Temp: 99.8 F (37.7  C) Temp Source: Oral Pulse Rate: 61 Resp: 18 BP: 126/74 mmHg Patient Position (if appropriate): Sitting Oxygen Therapy SpO2: 98 % O2 Device: Not Delivered Pain: Pain Assessment Pain Assessment: No/denies pain Pain Score: 4  Pain Type: Acute pain Pain Location: Shoulder Pain Orientation: Left Pain Descriptors / Indicators: Aching Pain Onset: With Activity Pain Intervention(s): Rest Multiple Pain Sites: No  See Function Navigator for Current Functional Status.   Therapy/Group: Individual Therapy  Raylene Everts [facility] 04/15/2015, 2:05 PM

## 2015-04-15 NOTE — Progress Notes (Signed)
RN spoke with tech- states that PT helped her urinate this morning while working with her. Patient was just bladder scanned and the residual was zero. Will continue to monitor.

## 2015-04-16 ENCOUNTER — Inpatient Hospital Stay (HOSPITAL_COMMUNITY): Payer: Medicare Other | Admitting: Occupational Therapy

## 2015-04-16 ENCOUNTER — Inpatient Hospital Stay (HOSPITAL_COMMUNITY): Payer: Medicare Other | Admitting: Physical Therapy

## 2015-04-16 ENCOUNTER — Inpatient Hospital Stay (HOSPITAL_COMMUNITY): Payer: Medicare Other | Admitting: Speech Pathology

## 2015-04-16 LAB — GLUCOSE, CAPILLARY
GLUCOSE-CAPILLARY: 217 mg/dL — AB (ref 65–99)
GLUCOSE-CAPILLARY: 169 mg/dL — AB (ref 65–99)
Glucose-Capillary: 166 mg/dL — ABNORMAL HIGH (ref 65–99)
Glucose-Capillary: 146 mg/dL — ABNORMAL HIGH (ref 65–99)

## 2015-04-16 MED ORDER — POTASSIUM CHLORIDE 20 MEQ/15ML (10%) PO SOLN
10.0000 meq | Freq: Two times a day (BID) | ORAL | Status: DC
  Administered 2015-04-16 – 2015-05-01 (×30): 10 meq via ORAL
  Filled 2015-04-16 (×30): qty 15

## 2015-04-16 NOTE — Progress Notes (Signed)
Speech Language Pathology Daily Session Note  Patient Details  Name: Nicole [name] MRN: [id number] Date of Birth: 1931-07-08  Today's Date: 04/16/2015 SLP Individual Time: 1165-7903 SLP Individual Time Calculation (min): 60 min  Short Term Goals: Week 1: SLP Short Term Goal 1 (Week 1): Patient will utilize external aids/cues for orientation to place, time and situation with Max A multimodal cues.  SLP Short Term Goal 2 (Week 1): Patient will demonstrate sustained attention to functional tasks for 2 minutes with Max A multimodal cues.  SLP Short Term Goal 3 (Week 1): Patient will identify 1 physical and 1 cognitive deficit with Max A multimodal cues.  SLP Short Term Goal 4 (Week 1): Patient will initiate functional tasks in 50% of opportunities with Max A multimodal cues.  SLP Short Term Goal 5 (Week 1): Patient will consume current diet with minimal overt s/s of aspiration with Max A multimodal cues for use of swallow strategies.  SLP Short Term Goal 6 (Week 1): Patient will demonstrate efficient mastication of Dys. 2 textures with minimal overt s/s of aspiration across 3 sessions to assess readiness for upgrade with Max A multimodal cues.   Skilled Therapeutic Interventions: Skilled ST intervention provided with focus on dysphagia and cognitive goals. Pt seated upright in w/c, to increase swallow safety. Pt consumed 100% of applesauce and HTL milk for snack. Trials of thin liquid via tsp administered, with 1 cough noted out of 5 tsp trials. Pt required verbal cues to swallow and liquid wash to clear oral cavity, with 50% of bites/sips. Decreased attention targeted through sorting activity. Pt sorted blocks into piles base on color with 75% accuracy, given additional time and min verbal cues.     Function:  Eating Eating   Modified Consistency Diet: Yes Eating Assist Level: More than reasonable amount of time;Helper feeds patient   Eating Set Up Assist For: Opening containers        Cognition Comprehension Comprehension assist level: Understands basic 25 - 49% of the time/ requires cueing 50 - 75% of the time  Expression   Expression assist level: Expresses basic 25 - 49% of the time/requires cueing 50 - 75% of the time. Uses single words/gestures.  Social Interaction Social Interaction assist level: Interacts appropriately less than 25% of the time. May be withdrawn or combative.  Problem Solving Problem solving assist level: Solves basic less than 25% of the time - needs direction nearly all the time or does not effectively solve problems and may need a restraint for safety  Memory Memory assist level: Recognizes or recalls less than 25% of the time/requires cueing greater than 75% of the time    Pain Pain Assessment Pain Assessment: No/denies pain  Therapy/Group: Individual Therapy  McMasters, Bernerd Pho 04/16/2015, 12:26 PM

## 2015-04-16 NOTE — Progress Notes (Signed)
Patient ID: TRIVA HUEBER, female   DOB: 05/29/1931, 81 y.o.   MRN: [id number]   Subjective/Complaints: Didn't sleep well "because it was her first night here". RN reported cloudy/foul smelling urine  ROS- neg bowel, bladder breathing or pain issues Objective: Vital Signs: Blood pressure 156/79, pulse 78, temperature 98.1 F (36.7 C), temperature source Oral, resp. rate 17, height _0  (1.753 m), weight 79.85 kg (176 lb 0.6 oz), SpO2 99 %. No results found. Results for orders placed or performed during the hospital encounter of 04/12/15 (from the past 72 hour(s))  Glucose, capillary     Status: Abnormal   Collection Time: 04/13/15 11:42 AM  Result Value Ref Range   Glucose-Capillary 188 (H) 65 - 99 mg/dL  Glucose, capillary     Status: Abnormal   Collection Time: 04/13/15  4:15 PM  Result Value Ref Range   Glucose-Capillary 135 (H) 65 - 99 mg/dL  Culture, Urine     Status: None   Collection Time: 04/13/15  6:43 PM  Result Value Ref Range   Specimen Description URINE, CATHETERIZED    Special Requests NONE    Culture NO GROWTH 2 DAYS    Report Status 04/15/2015 FINAL   Urinalysis, Routine w reflex microscopic (not at [facility])     Status: Abnormal   Collection Time: 04/13/15  6:44 PM  Result Value Ref Range   Color, Urine AMBER (A) YELLOW    Comment: BIOCHEMICALS MAY BE AFFECTED BY COLOR   APPearance CLEAR CLEAR   Specific Gravity, Urine 1.021 1.005 - 1.030   pH 5.5 5.0 - 8.0   Glucose, UA NEGATIVE NEGATIVE mg/dL   Hgb urine dipstick NEGATIVE NEGATIVE   Bilirubin Urine SMALL (A) NEGATIVE   Ketones, ur NEGATIVE NEGATIVE mg/dL   Protein, ur 100 (A) NEGATIVE mg/dL   Urobilinogen, UA 2.0 (H) 0.0 - 1.0 mg/dL   Nitrite NEGATIVE NEGATIVE   Leukocytes, UA NEGATIVE NEGATIVE  Urine microscopic-add on     Status: Abnormal   Collection Time: 04/13/15  6:44 PM  Result Value Ref Range   Squamous Epithelial / LPF RARE RARE   WBC, UA 0-2 <3 WBC/hpf   RBC / HPF 0-2 <3 RBC/hpf   Bacteria,  UA RARE RARE   Casts HYALINE CASTS (A) NEGATIVE   Urine-Other AMORPHOUS URATES/PHOSPHATES   Glucose, capillary     Status: Abnormal   Collection Time: 04/13/15  8:57 PM  Result Value Ref Range   Glucose-Capillary 150 (H) 65 - 99 mg/dL  Glucose, capillary     Status: Abnormal   Collection Time: 04/14/15  6:47 AM  Result Value Ref Range   Glucose-Capillary 142 (H) 65 - 99 mg/dL  Glucose, capillary     Status: Abnormal   Collection Time: 04/14/15 12:08 PM  Result Value Ref Range   Glucose-Capillary 134 (H) 65 - 99 mg/dL  Glucose, capillary     Status: Abnormal   Collection Time: 04/14/15  4:41 PM  Result Value Ref Range   Glucose-Capillary 121 (H) 65 - 99 mg/dL  Glucose, capillary     Status: Abnormal   Collection Time: 04/14/15  8:56 PM  Result Value Ref Range   Glucose-Capillary 143 (H) 65 - 99 mg/dL  Glucose, capillary     Status: Abnormal   Collection Time: 04/15/15  6:55 AM  Result Value Ref Range   Glucose-Capillary 151 (H) 65 - 99 mg/dL  Basic metabolic panel     Status: Abnormal   Collection Time: 04/15/15  7:15 AM  Result Value Ref Range   Sodium 141 135 - 145 mmol/L   Potassium 3.2 (L) 3.5 - 5.1 mmol/L   Chloride 109 101 - 111 mmol/L   CO2 24 22 - 32 mmol/L   Glucose, Bld 158 (H) 65 - 99 mg/dL   BUN 17 6 - 20 mg/dL   Creatinine, Ser 1.12 (H) 0.44 - 1.00 mg/dL   Calcium 9.1 8.9 - 10.3 mg/dL   GFR calc non Af Amer 44 (L) >60 mL/min   GFR calc Af Amer 52 (L) >60 mL/min    Comment: (NOTE) The eGFR has been calculated using the CKD EPI equation. This calculation has not been validated in all clinical situations. eGFR's persistently <60 mL/min signify possible Chronic Kidney Disease.    Anion gap 8 5 - 15  Glucose, capillary     Status: Abnormal   Collection Time: 04/15/15 11:29 AM  Result Value Ref Range   Glucose-Capillary 244 (H) 65 - 99 mg/dL  Glucose, capillary     Status: Abnormal   Collection Time: 04/15/15  4:31 PM  Result Value Ref Range    Glucose-Capillary 139 (H) 65 - 99 mg/dL  Glucose, capillary     Status: Abnormal   Collection Time: 04/15/15  8:50 PM  Result Value Ref Range   Glucose-Capillary 160 (H) 65 - 99 mg/dL   Comment 1 Notify RN   Glucose, capillary     Status: Abnormal   Collection Time: 04/16/15  6:32 AM  Result Value Ref Range   Glucose-Capillary 166 (H) 65 - 99 mg/dL   Comment 1 Notify RN      HEENT: normal Cardio: RRR and diastolic murmur Resp: CTA B/L and unlabored GI: BS positive and non distended Extremity:  No Edema Skin:   Intact Neuro: pleasantly Confused, Flat, Cranial Nerve II-XII normal, Normal Sensory, Abnormal Motor 3- Left delt bi tri 2-grip 3- L HF, 4- KE , 3- ankle DF, 5/5 on Right side and Abnormal FMC Ataxic/ dec FMC Musc/Skel:  Other no pain with UE or LE ROM Gen NAD   Assessment/Plan: 1. Functional deficits secondary to right caudate intercerebral hemorrhage secondary to hypertensive crisis with left hemiparesis which require 3+ hours per day of interdisciplinary therapy in a comprehensive inpatient rehab setting. Physiatrist is providing close team supervision and 24 hour management of active medical problems listed below. Physiatrist and rehab team continue to assess barriers to discharge/monitor patient progress toward functional and medical goals. FIM: Function - Bathing Position: Wheelchair/chair at sink Body parts bathed by patient: Right arm, Left arm, Chest, Abdomen, Front perineal area, Right upper leg, Left upper leg Body parts bathed by helper: Left lower leg, Right lower leg, Back, Buttocks Bathing not applicable: Front perineal area, Buttocks (pt has been cleansed prior to session) Assist Level: Touching or steadying assistance(Pt > 75%)  Function- Upper Body Dressing/Undressing What is the patient wearing?: Bra, Pull over shirt/dress Bra - Perfomed by helper: Thread/unthread right bra strap, Thread/unthread left bra strap, Hook/unhook bra (pull down sports  bra) Pull over shirt/dress - Perfomed by helper: Thread/unthread right sleeve, Thread/unthread left sleeve, Put head through opening, Pull shirt over trunk Button up shirt - Perfomed by patient: Thread/unthread right sleeve Button up shirt - Perfomed by helper: Thread/unthread left sleeve, Pull shirt around back, Button/unbutton shirt Assist Level: Touching or steadying assistance(Pt > 75%) Function - Lower Body Dressing/Undressing What is the patient wearing?: Underwear, Pants, Non-skid slipper socks Position: Wheelchair/chair at sink Underwear - Performed by helper: Thread/unthread right underwear  leg, Thread/unthread left underwear leg, Pull underwear up/down Pants- Performed by helper: Thread/unthread right pants leg, Thread/unthread left pants leg, Pull pants up/down Non-skid slipper socks- Performed by helper: Don/doff right sock, Don/doff left sock Assist Level: Touching or steadying assistance (Pt > 75%)  Function - Toileting Toileting steps completed by helper: Adjust clothing prior to toileting, Adjust clothing after toileting, Performs perineal hygiene Toileting Assistive Devices: Other (comment) (bed pan) Assist level:  (mod A balance)  Function - Toilet Transfers Toilet transfer assistive device: Elevated toilet seat/BSC over toilet, Grab bar Assist level to toilet: Maximal assist (Pt 25 - 49%/lift and lower) Assist level from toilet: Maximal assist (Pt 25 - 49%/lift and lower) Assist level to bedside commode (at bedside): Maximal assist (Pt 25 - 49%/lift and lower) Assist level from bedside commode (at bedside): Maximal assist (Pt 25 - 49%/lift and lower)  Function - Chair/bed transfer Chair/bed transfer method: Stand pivot Chair/bed transfer assist level: Moderate assist (Pt 50 - 74%/lift or lower) Chair/bed transfer assistive device: Walker, Armrests Chair/bed transfer details: Manual facilitation for placement, Manual facilitation for weight shifting, Verbal cues for  sequencing, Verbal cues for technique  Function - Locomotion: Wheelchair Will patient use wheelchair at discharge?: Yes Type: Manual Max wheelchair distance: 17' Assist Level: Total assistance (Pt < 25%) Wheel 50 feet with 2 turns activity did not occur: Safety/medical concerns Wheel 150 feet activity did not occur: Safety/medical concerns Turns around,maneuvers to table,bed, and toilet,negotiates 3% grade,maneuvers on rugs and over doorsills: No Function - Locomotion: Ambulation Assistive device: Walker-rolling Max distance: 100' Assist level: 2 helpers (mod A from PT; 2nd assist for w/c follow) Assist level: Moderate assist (Pt 50 - 74%) Assist level: 2 helpers Walk 150 feet activity did not occur: Safety/medical concerns Assist level: Maximal assist (Pt 25 - 49%)  Function - Comprehension Comprehension: Auditory Comprehension assist level:  (asleep)  Function - Expression Expression: Verbal Expression assist level:  (asleep)  Function - Social Interaction Social Interaction assist level:  (asleep)  Function - Problem Solving Problem solving assist level:  (asleep)  Function - Memory Memory assist level:  (asleep) Patient normally able to recall (first 3 days only): Current season  Medical Problem List and Plan: 1. Functional deficits secondary to right caudate intercerebral hemorrhage secondary to hypertensive crisis 2. DVT Prophylaxis/Anticoagulation: Subcutaneous heparin added for DVT prophylaxis 04/08/2015 3. Pain Management: Tylenol as needed 4. Hypertension. Clonidine 0.2 mg 3 times a day, Cardizem 240 mg daily, lisinopril 20 mg twice a day, Lopressor 25 mg twice a day. Will allow a degree of "permissive hypertension" to maintain perfusion  5. Neuropsych: This patient is capable of making decisions on her own behalf. 6. Skin/Wound Care: Routine skin checks 7. Fluids/Electrolytes/Nutrition: Routine I&O with follow-up chemistries, meal intake 20-50% 8. Systolic  congestive heart failure. Check daily weights and I's and O's. Patient on Lasix 40 mg daily prior to admission. Resume as needed currently fluid intake very low , minimal pedal edema 9. Atrial fibrillation. Chronic Coumadin discontinued secondary to intracerebral hemorrhage. Cardiac rate controlled. Continue Cardizem and Lopressor 10. Diabetes mellitus peripheral neuropathy. Hemoglobin A1c 5.9. Sliding scale insulin. Check blood sugars before meals and at bedtime. Patient on Glucophage 500 mg twice a day prior to admission.   -am glucophage may need to be initiated 11. Chronic renal insufficiency. Baseline creatinine 1.35. Follow-up chemistries in am. Past labs all reviewed. 12. Hypothyroidism. TSH level 2.053. Continue Synthroid 13. History of gout. Zyloprim 100 mg daily. Not active currently 14. Hyperlipidemia. Resume statin  at discharge. 15.  Hypokalemia- cont KCL and do f/u BMET 16. Urine: send next specimen for ua cx.  LOS (Days) 4 A FACE TO FACE EVALUATION WAS PERFORMED  SWARTZ,ZACHARY T 04/16/2015, 8:16 AM

## 2015-04-16 NOTE — Plan of Care (Signed)
Problem: RH BLADDER ELIMINATION Goal: RH STG MANAGE BLADDER WITH ASSISTANCE STG Manage Bladder with total assist.  Outcome: Not Progressing Still requires straight cath at times

## 2015-04-16 NOTE — Progress Notes (Signed)
Occupational Therapy Session Note  Patient Details  Name: Nicole [name] MRN: [id number] Date of Birth: [birth year]  Today's Date: 04/16/2015 OT Individual Time: 8413-2440 and 1027-2536 OT Individual Time Calculation (min): 57 min and 30 min    Short Term Goals: Week 1:  OT Short Term Goal 1 (Week 1): Pt will be able to transfer to toilet with min A. OT Short Term Goal 2 (Week 1): Pt will be able to toilet with min A. OT Short Term Goal 3 (Week 1): Pt will be able to don shirt with min A. OT Short Term Goal 4 (Week 1): Pt will don pants with mod A. OT Short Term Goal 5 (Week 1): Pt will actively use LUE to wash R arm with min cues.  Skilled Therapeutic Interventions/Progress Updates:    Session 1: Upon entering the room , pt supine in bed with 4/10 c/o pain in L UE and RN present in room providing medications. Skilled OT intervention this session with focus on self care retraining, attention, initiation, sequencing, and encouraged use of L UE. Supine >sit with max A and max verbal cues for task. Pt required max A as well for stand pivot transfer into wheelchair from bed. Pt required max verbal and tactile cues for forwards weight shifting for transfer. Pt is very fearful of falling. Pt required max multimodal cues for bathing and dressing this session. Pt with increase apraxia as she reacher for one item but picked up another and attempting to use in the wrong way. Pt required max A for sit to stand from wheelchair with min - mod A in standing for clothing management. Pt remained in wheelchair with quick release belt donned, call bell, and all needed items within reach upon exiting the room.   Session 2: Upon entering the room, pt seated in wheelchair with family present. Skilled OT intervention with focus on family education, anterior weight shift, and sit <>stands. Pt requiring manual facilitation and  max multimodal cues for hand placement and fatigue for sit <>stands x 4 reps with mod A. OT  observing increased pushing with R UE this session as well. Pt oriented to self only and able to orient to location, situation, and time with max verbal guidance cues and 2 choices. OT educating family on pts progress towards OT goals at this time. Quick release belt donned and pt remained in wheelchair with all needs within reach.   Therapy Documentation Precautions:  Precautions Precautions: Fall Precaution Comments: L hemiplegia Restrictions Weight Bearing Restrictions: No General:   Vital Signs: Therapy Vitals Temp: 98.1 F (36.7 C) Temp Source: Oral Pulse Rate: 78 Resp: 17 BP: (!) 156/79 mmHg Patient Position (if appropriate): Lying Oxygen Therapy SpO2: 99 % O2 Device: Not Delivered  See Function Navigator for Current Functional Status.   Therapy/Group: Individual Therapy  Phineas Semen 04/16/2015, 9:00 AM

## 2015-04-16 NOTE — Progress Notes (Signed)
Physical Therapy Session Note  Patient Details  Name: Nicole [name] MRN: [id number] Date of Birth: 08/30/1930  Today's Date: 04/16/2015 PT Individual Time: 2595-6387 PT Individual Time Calculation (min): 50 min   Short Term Goals: Week 1:  PT Short Term Goal 1 (Week 1): Pt will demonstrate side lie to sit req mod A consistently.  PT Short Term Goal 2 (Week 1): Pt will demonstrate sit to stand req min A.  PT Short Term Goal 3 (Week 1): Pt will demonstrate stand-step transfer req mod A.  PT Short Term Goal 4 (Week 1): Pt will ambulate 100' req mod A with RW PT Short Term Goal 5 (Week 1): Pt will demonstrate w/c propulsion x 50' req max A.   Skilled Therapeutic Interventions/Progress Updates:    Pt received in bed with many family members present, req moderate encouragement to participate in PT. Therapeutic Activity - see function tab for bed mobility, transfer, and toileting details. Pt has significant difficulty turning to her R to back up to the toilet due to pushing to L. Pt req one-step sequencing cues to wash hands at sink in stand with counter support. Gait Training - see function tab for details. PT provides strong lateral weight shift assist in order to encourage pt initiation of stepping response, as well as intermittent forward progression of RW. Pt pushes moderately strongly to the L with R side and takes small, shuffle steps, often with L leg falling behind R leg in a staggered position. Pt ended up in w/c passed off to nurse tech for bladder to be scanned. Continue per PT POC.   Therapy Documentation Precautions:  Precautions Precautions: Fall Precaution Comments: L hemiplegia Restrictions Weight Bearing Restrictions: No Pain: Pain Assessment Pain Assessment: No/denies pain   See Function Navigator for Current Functional Status.   Therapy/Group: Individual Therapy  HYSLOP,AMANDA M 04/16/2015, 3:51 PM

## 2015-04-17 ENCOUNTER — Inpatient Hospital Stay (HOSPITAL_COMMUNITY): Payer: Medicare Other | Admitting: Occupational Therapy

## 2015-04-17 LAB — GLUCOSE, CAPILLARY
GLUCOSE-CAPILLARY: 207 mg/dL — AB (ref 65–99)
GLUCOSE-CAPILLARY: 180 mg/dL — AB (ref 65–99)
GLUCOSE-CAPILLARY: 225 mg/dL — AB (ref 65–99)
Glucose-Capillary: 151 mg/dL — ABNORMAL HIGH (ref 65–99)

## 2015-04-17 NOTE — Progress Notes (Signed)
Occupational Therapy Session Note  Patient Details  Name: Nicole [name] MRN: [id number] Date of Birth: April 15, 1931  Today's Date: 04/17/2015 OT Individual Time:  -   1130-1200  (30 min  )      Short Term Goals: Week 1:  OT Short Term Goal 1 (Week 1): Pt will be able to transfer to toilet with min A. OT Short Term Goal 2 (Week 1): Pt will be able to toilet with min A. OT Short Term Goal 3 (Week 1): Pt will be able to don shirt with min A. OT Short Term Goal 4 (Week 1): Pt will don pants with mod A. OT Short Term Goal 5 (Week 1): Pt will actively use LUE to wash R arm with min cues. Week 2:      Therapy Documentation Upon entering the room , pt supine in bed asleep.  Pt aroused.  Engaged in [place] and PROM.  Pt complained of 4/10 pain with PROM in Hand and forearm.  Arthritis noted in left hand.  Pt. Refusing to get out of bed.  Completed exercises in all planes.  Remained in bed with bed alarm on and call bell,phone within reach.   Precautions:  Precautions Precautions: Fall Precaution Comments: L hemiplegia Restrictions Weight Bearing Restrictions: No General:   Vital Signs: Therapy Vitals Temp: 98.7 F (37.1 C) Temp Source: Oral Pulse Rate: 73 Resp: 17 BP: (!) 142/86 mmHg Patient Position (if appropriate): Lying Oxygen Therapy SpO2: 100 % O2 Device: Not Delivered Pain:  4/10  lue with prom           Therapy/Group: Individual Therapy  Lisa Roca 04/17/2015, 7:57 AM

## 2015-04-17 NOTE — Progress Notes (Signed)
At 2400 incontinent of urine, PVR=83cc's. Drank one cup of water and [name]. Poor initiation. Nicole [name]

## 2015-04-17 NOTE — Plan of Care (Signed)
Problem: RH BOWEL ELIMINATION Goal: RH STG MANAGE BOWEL WITH ASSISTANCE STG Manage Bowel with Assistance. Mod A  Outcome: Not Progressing Total assist to manage bowels  Problem: RH SKIN INTEGRITY Goal: RH STG MAINTAIN SKIN INTEGRITY WITH ASSISTANCE STG Maintain Skin Integrity With moderate Assistance.  Outcome: Not Progressing Poor initiation, needs cues.  Problem: RH KNOWLEDGE DEFICIT Goal: RH STG INCREASE KNOWLEDGE OF DIABETES Verbalize s/s of hyper/hypoglycemic episode max assistance  Outcome: Not Progressing A & O to self only.  No evidence of learning.  Goal: RH STG INCREASE KNOWLEDGE OF HYPERTENSION Able to verbalize s/s of hypo/hypertension, and demonstrate understanding of medication, treatment, and diet prescribed by MD with moderate assistance.  Outcome: Not Progressing No evidence of learning.

## 2015-04-17 NOTE — Progress Notes (Signed)
Patient ID: Nicole [name], female   DOB: [birth year], 81 y.o.   MRN: [id number]   Subjective/Complaints: No new issues. Still urine incontinence. Fair night.  ROS- neg bowel, bladder breathing or pain issues, limited due to cognition Objective: Vital Signs: Blood pressure 142/86, pulse 73, temperature 98.7 F (37.1 C), temperature source Oral, resp. rate 17, height _0  (1.753 m), weight 79.85 kg (176 lb 0.6 oz), SpO2 100 %. No results found. Results for orders placed or performed during the hospital encounter of 04/12/15 (from the past 72 hour(s))  Glucose, capillary     Status: Abnormal   Collection Time: 04/14/15 12:08 PM  Result Value Ref Range   Glucose-Capillary 134 (H) 65 - 99 mg/dL  Glucose, capillary     Status: Abnormal   Collection Time: 04/14/15  4:41 PM  Result Value Ref Range   Glucose-Capillary 121 (H) 65 - 99 mg/dL  Glucose, capillary     Status: Abnormal   Collection Time: 04/14/15  8:56 PM  Result Value Ref Range   Glucose-Capillary 143 (H) 65 - 99 mg/dL  Glucose, capillary     Status: Abnormal   Collection Time: 04/15/15  6:55 AM  Result Value Ref Range   Glucose-Capillary 151 (H) 65 - 99 mg/dL  Basic metabolic panel     Status: Abnormal   Collection Time: 04/15/15  7:15 AM  Result Value Ref Range   Sodium 141 135 - 145 mmol/L   Potassium 3.2 (L) 3.5 - 5.1 mmol/L   Chloride 109 101 - 111 mmol/L   CO2 24 22 - 32 mmol/L   Glucose, Bld 158 (H) 65 - 99 mg/dL   BUN 17 6 - 20 mg/dL   Creatinine, Ser 1.12 (H) 0.44 - 1.00 mg/dL   Calcium 9.1 8.9 - 10.3 mg/dL   GFR calc non Af Amer 44 (L) >60 mL/min   GFR calc Af Amer 52 (L) >60 mL/min    Comment: (NOTE) The eGFR has been calculated using the CKD EPI equation. This calculation has not been validated in all clinical situations. eGFR's persistently <60 mL/min signify possible Chronic Kidney Disease.    Anion gap 8 5 - 15  Glucose, capillary     Status: Abnormal   Collection Time: 04/15/15 11:29 AM  Result  Value Ref Range   Glucose-Capillary 244 (H) 65 - 99 mg/dL  Glucose, capillary     Status: Abnormal   Collection Time: 04/15/15  4:31 PM  Result Value Ref Range   Glucose-Capillary 139 (H) 65 - 99 mg/dL  Glucose, capillary     Status: Abnormal   Collection Time: 04/15/15  8:50 PM  Result Value Ref Range   Glucose-Capillary 160 (H) 65 - 99 mg/dL   Comment 1 Notify RN   Glucose, capillary     Status: Abnormal   Collection Time: 04/16/15  6:32 AM  Result Value Ref Range   Glucose-Capillary 166 (H) 65 - 99 mg/dL   Comment 1 Notify RN   Glucose, capillary     Status: Abnormal   Collection Time: 04/16/15 11:17 AM  Result Value Ref Range   Glucose-Capillary 217 (H) 65 - 99 mg/dL  Glucose, capillary     Status: Abnormal   Collection Time: 04/16/15  4:16 PM  Result Value Ref Range   Glucose-Capillary 146 (H) 65 - 99 mg/dL  Glucose, capillary     Status: Abnormal   Collection Time: 04/16/15  8:52 PM  Result Value Ref Range   Glucose-Capillary 169 (H) 65 -  99 mg/dL  Glucose, capillary     Status: Abnormal   Collection Time: 04/17/15  6:37 AM  Result Value Ref Range   Glucose-Capillary 151 (H) 65 - 99 mg/dL   Comment 1 Notify RN      HEENT: normal Cardio: RRR and diastolic murmur Resp: CTA B/L and unlabored GI: BS positive and non distended Extremity:  No Edema Skin:   Intact Neuro: pleasantly Confused, Flat, Cranial Nerve II-XII normal, Normal Sensory, Abnormal Motor 3- Left delt bi tri 2-grip 3- L HF, 4- KE , 3- ankle DF, 5/5 on Right side and Abnormal FMC Ataxic/ dec FMC Musc/Skel:  Other no pain with UE or LE ROM Gen NAD   Assessment/Plan: 1. Functional deficits secondary to right caudate intercerebral hemorrhage secondary to hypertensive crisis with left hemiparesis which require 3+ hours per day of interdisciplinary therapy in a comprehensive inpatient rehab setting. Physiatrist is providing close team supervision and 24 hour management of active medical problems listed  below. Physiatrist and rehab team continue to assess barriers to discharge/monitor patient progress toward functional and medical goals. FIM: Function - Bathing Position: Wheelchair/chair at sink Body parts bathed by patient: Right arm, Left arm, Chest, Abdomen, Front perineal area, Right upper leg, Left upper leg Body parts bathed by helper: Buttocks, Right lower leg, Left lower leg, Back Bathing not applicable: Front perineal area, Buttocks (pt has been cleansed prior to session) Assist Level: Touching or steadying assistance(Pt > 75%)  Function- Upper Body Dressing/Undressing What is the patient wearing?: Pull over shirt/dress Bra - Perfomed by helper: Thread/unthread right bra strap, Thread/unthread left bra strap, Hook/unhook bra (pull down sports bra) Pull over shirt/dress - Perfomed by helper: Thread/unthread right sleeve, Thread/unthread left sleeve, Put head through opening, Pull shirt over trunk Button up shirt - Perfomed by patient: Thread/unthread right sleeve Button up shirt - Perfomed by helper: Thread/unthread left sleeve, Pull shirt around back, Button/unbutton shirt Assist Level: Touching or steadying assistance(Pt > 75%) Function - Lower Body Dressing/Undressing What is the patient wearing?: Underwear, Pants, Non-skid slipper socks Position: Wheelchair/chair at sink Underwear - Performed by helper: Thread/unthread right underwear leg, Thread/unthread left underwear leg, Pull underwear up/down Pants- Performed by helper: Thread/unthread right pants leg, Thread/unthread left pants leg, Pull pants up/down Non-skid slipper socks- Performed by helper: Don/doff right sock, Don/doff left sock Assist Level: Touching or steadying assistance (Pt > 75%)  Function - Toileting Toileting steps completed by patient: Performs perineal hygiene Toileting steps completed by helper: Adjust clothing prior to toileting, Adjust clothing after toileting Toileting Assistive Devices: Other  (comment) (bed pan) Assist level: Touching or steadying assistance (Pt.75%)  Function - Toilet Transfers Toilet transfer assistive device: Walker, Elevated toilet seat/BSC over toilet Assist level to toilet: Maximal assist (Pt 25 - 49%/lift and lower) Assist level from toilet: Moderate assist (Pt 50 - 74%/lift or lower) Assist level to bedside commode (at bedside): Maximal assist (Pt 25 - 49%/lift and lower) Assist level from bedside commode (at bedside): Maximal assist (Pt 25 - 49%/lift and lower)  Function - Chair/bed transfer Chair/bed transfer method: Ambulatory Chair/bed transfer assist level: Moderate assist (Pt 50 - 74%/lift or lower) Chair/bed transfer assistive device: Walker, Armrests Chair/bed transfer details: Manual facilitation for placement, Manual facilitation for weight shifting, Verbal cues for sequencing, Verbal cues for technique  Function - Locomotion: Wheelchair Will patient use wheelchair at discharge?: Yes Type: Manual Max wheelchair distance: 67' Assist Level: Total assistance (Pt < 25%) Wheel 50 feet with 2 turns activity did not occur:  Safety/medical concerns Wheel 150 feet activity did not occur: Safety/medical concerns Turns around,maneuvers to table,bed, and toilet,negotiates 3% grade,maneuvers on rugs and over doorsills: No Function - Locomotion: Ambulation Assistive device: Walker-rolling Max distance: 100' Assist level: 2 helpers (mod A from PT; +2 for w/c follow) Assist level: Moderate assist (Pt 50 - 74%) Assist level: 2 helpers Walk 150 feet activity did not occur: Safety/medical concerns Assist level: Maximal assist (Pt 25 - 49%)  Function - Comprehension Comprehension: Auditory Comprehension assist level: Understands basic 25 - 49% of the time/ requires cueing 50 - 75% of the time  Function - Expression Expression: Verbal Expression assist level: Expresses basic 25 - 49% of the time/requires cueing 50 - 75% of the time. Uses single  words/gestures.  Function - Social Interaction Social Interaction assist level: Interacts appropriately 25 - 49% of time - Needs frequent redirection.  Function - Problem Solving Problem solving assist level: Solves basic less than 25% of the time - needs direction nearly all the time or does not effectively solve problems and may need a restraint for safety  Function - Memory Memory assist level: Recognizes or recalls 25 - 49% of the time/requires cueing 50 - 75% of the time Patient normally able to recall (first 3 days only): None of the above  Medical Problem List and Plan: 1. Functional deficits secondary to right caudate intercerebral hemorrhage secondary to hypertensive crisis 2. DVT Prophylaxis/Anticoagulation: Subcutaneous heparin added for DVT prophylaxis 04/08/2015 3. Pain Management: Tylenol as needed 4. Hypertension. Clonidine 0.2 mg 3 times a day, Cardizem 240 mg daily, lisinopril 20 mg twice a day, Lopressor 25 mg twice a day. Will allow a degree of "permissive hypertension" to maintain perfusion  5. Neuropsych: This patient is capable of making decisions on her own behalf. 6. Skin/Wound Care: Routine skin checks 7. Fluids/Electrolytes/Nutrition: Routine I&O with follow-up chemistries, meal intake 20-50% 8. Systolic congestive heart failure. Check daily weights and I's and O's. Patient on Lasix 40 mg daily prior to admission. Resume as needed currently fluid intake very low , minimal pedal edema 9. Atrial fibrillation. Chronic Coumadin discontinued secondary to intracerebral hemorrhage. Cardiac rate controlled. Continue Cardizem and Lopressor 10. Diabetes mellitus peripheral neuropathy. Hemoglobin A1c 5.9. Sliding scale insulin. Check blood sugars before meals and at bedtime. Patient on Glucophage 500 mg twice a day prior to admission.   -am glucophage may need to be initiated--observe for pattern 11. Chronic renal insufficiency. Baseline creatinine 1.35. Follow-up chemistries  in am. Past labs all reviewed. 12. Hypothyroidism. TSH level 2.053. Continue Synthroid 13. History of gout. Zyloprim 100 mg daily. Not active currently 14. Hyperlipidemia. Resume statin at discharge. 15.  Hypokalemia- cont KCL and do f/u BMET 16. Urinary incontinence: timed voids. ucx pending (ua not sent) LOS (Days) 5 A FACE TO FACE EVALUATION WAS PERFORMED  SWARTZ,ZACHARY T 04/17/2015, 8:03 AM

## 2015-04-18 ENCOUNTER — Inpatient Hospital Stay (HOSPITAL_COMMUNITY): Payer: Medicare Other

## 2015-04-18 ENCOUNTER — Inpatient Hospital Stay (HOSPITAL_COMMUNITY): Payer: Medicare Other | Admitting: Speech Pathology

## 2015-04-18 ENCOUNTER — Inpatient Hospital Stay (HOSPITAL_COMMUNITY): Payer: Medicare Other | Admitting: Physical Therapy

## 2015-04-18 DIAGNOSIS — M25512 Pain in left shoulder: Secondary | ICD-10-CM

## 2015-04-18 LAB — BASIC METABOLIC PANEL
Anion gap: 9 (ref 5–15)
BUN: 27 mg/dL — AB (ref 6–20)
CHLORIDE: 108 mmol/L (ref 101–111)
CO2: 24 mmol/L (ref 22–32)
CREATININE: 1.4 mg/dL — AB (ref 0.44–1.00)
Calcium: 9.6 mg/dL (ref 8.9–10.3)
GFR calc Af Amer: 39 mL/min — ABNORMAL LOW (ref >60)
GFR calc non Af Amer: 34 mL/min — ABNORMAL LOW (ref >60)
GLUCOSE: 306 mg/dL — AB (ref 65–99)
Potassium: 4.2 mmol/L (ref 3.5–5.1)
SODIUM: 141 mmol/L (ref 135–145)

## 2015-04-18 LAB — GLUCOSE, CAPILLARY
GLUCOSE-CAPILLARY: 178 mg/dL — AB (ref 65–99)
GLUCOSE-CAPILLARY: 252 mg/dL — AB (ref 65–99)
Glucose-Capillary: 128 mg/dL — ABNORMAL HIGH (ref 65–99)
Glucose-Capillary: 161 mg/dL — ABNORMAL HIGH (ref 65–99)

## 2015-04-18 LAB — URINE CULTURE

## 2015-04-18 MED ORDER — CEPHALEXIN 250 MG PO CAPS
250.0000 mg | ORAL_CAPSULE | Freq: Three times a day (TID) | ORAL | Status: DC
  Administered 2015-04-18 – 2015-04-27 (×27): 250 mg via ORAL
  Filled 2015-04-18 (×28): qty 1

## 2015-04-18 NOTE — Progress Notes (Signed)
Occupational Therapy Session Note  Patient Details  Name: Nicole [name] MRN: [id number] Date of Birth: 08-Apr-1931  Today's Date: 04/18/2015 OT Individual Time: 0900-1030 OT Individual Time Calculation (min): 90 min    Short Term Goals: Week 1:  OT Short Term Goal 1 (Week 1): Pt will be able to transfer to toilet with min A. OT Short Term Goal 2 (Week 1): Pt will be able to toilet with min A. OT Short Term Goal 3 (Week 1): Pt will be able to don shirt with min A. OT Short Term Goal 4 (Week 1): Pt will don pants with mod A. OT Short Term Goal 5 (Week 1): Pt will actively use LUE to wash R arm with min cues.  Skilled Therapeutic Interventions/Progress Updates: ADL-retraining at shower level with focus on improved attention, initiation, sequencing, transfers, dynamic sitting balance, sit<>stand, functional mobility using RW, static standing balance.  Pt received seated in w/c with friend [place] attending.   OT educated friend on plan for treatment and pt agreed to allow friend to remain to observe her walking to bathroom.  With extra time, max multimodal cues and manual facilitation to weight-shift and advance left leg, pt ambulated 8' to shower from bedside before requiring rest and assist to w/c.  Pt rested briefly and completed SPT from w/c to bench with similar cues and assist, overall max assist although mod assist to lift and min to lower (Pt = 50% to stand, 80% to lower).   Pt bathed seated on bench and was distracted by aged grout on tiles, requiring max vc to cease cleaning tiles and to redirect attention to her bathing.   Pt stood supported using grab bar and leaning into therapist as she used non-dominant UE (right) to wash peri-area.   Pt recovered to w/c and required overall max assist to dress from w/c level, standing at sink to allow OT to pull up her pants.   Pt left in w/c with QRB attached, call light on her lap, and lab worker present to draw blood.     Therapy  Documentation Precautions:  Precautions Precautions: Fall Precaution Comments: L hemiplegia Restrictions Weight Bearing Restrictions: No  Vital Signs: Therapy Vitals Pulse Rate: 97 BP: (!) 146/98 mmHg   Pain: Pain Assessment Pain Assessment: Faces Faces Pain Scale: Hurts whole lot Pain Type: Acute pain Pain Location: Arm Pain Orientation: Left Pain Descriptors / Indicators: Jabbing Pain Frequency: Intermittent Pain Onset: With Activity Patients Stated Pain Goal: 3 Pain Intervention(s): Repositioned;Therapeutic touch;Shower  See Function Navigator for Current Functional Status.   Therapy/Group: Individual Therapy   Second session: Time: 1300-1330 Time Calculation (min):  30 min  Pain Assessment:  Pain Assessment Pain Assessment: Faces Faces Pain Scale: Hurts even more Pain Type: chronic pain Pain Location: Arm Pain Orientation: Left Pain Descriptors / Indicators: discomfort Pain Frequency: Intermittent Pain Onset: With Activity Patients Stated Pain Goal: 3 Pain Intervention(s): Repositioned;distraction  Skilled Therapeutic Interventions: ADL-retraining with focus on improved awareness, attention, organizing, sequencing.   Pt received seated in w/c with RN tech supervising self-feeding.  Pt reports end of self-feeding and agrees to groom at sink (oral care).   After setup to place toothbrush/sponge and supplies in left hand, pt reports ongoing pain and weakness prevents her from using her left hand.   Pt grasps toothbrush in right and initiated oral care however with poor thoroughness or awareness of space/objects.    Pt requires hand-over-hand guidance to place basin closer and to apply toothpaste.  Pt reports  preference from clear water to clean teeth and gums and with extra time and max vc continues with grooming with mod assist to correct postural alignment in w/c and to rinse toothbrush.  Pt sustains conversation throughout task but requires repeated reorientation  treatment plan and to daily schedule using printed schedule.   Pt's friend [name] is present to observe intermittently during session.   See FIM for current functional status  Therapy/Group: Individual Therapy  BARTHOLD,FRANK 04/18/2015, 10:43 AM

## 2015-04-18 NOTE — Plan of Care (Signed)
Problem: RH SAFETY Goal: RH STG ADHERE TO SAFETY PRECAUTIONS W/ASSISTANCE/DEVICE STG Adhere to Safety Precautions With Assistance/Device. Supervision  Outcome: Not Progressing Requires 2+ A  With transferring at this time

## 2015-04-18 NOTE — Progress Notes (Signed)
Patient ID: Nicole [name], female   DOB: [birth year], 81 y.o.   MRN: [id number]   Subjective/Complaints: No new issues. Still urine incontinence. Fair night.  ROS- neg bowel, bladder breathing or pain issues, limited due to cognition Objective: Vital Signs: Blood pressure 162/87, pulse 73, temperature 99 F (37.2 C), temperature source Oral, resp. rate 18, height 5' 9"  (1.753 m), weight 79.85 kg (176 lb 0.6 oz), SpO2 98 %. No results found. Results for orders placed or performed during the hospital encounter of 04/12/15 (from the past 72 hour(s))  Basic metabolic panel     Status: Abnormal   Collection Time: 04/15/15  7:15 AM  Result Value Ref Range   Sodium 141 135 - 145 mmol/L   Potassium 3.2 (L) 3.5 - 5.1 mmol/L   Chloride 109 101 - 111 mmol/L   CO2 24 22 - 32 mmol/L   Glucose, Bld 158 (H) 65 - 99 mg/dL   BUN 17 6 - 20 mg/dL   Creatinine, Ser 1.12 (H) 0.44 - 1.00 mg/dL   Calcium 9.1 8.9 - 10.3 mg/dL   GFR calc non Af Amer 44 (L) >60 mL/min   GFR calc Af Amer 52 (L) >60 mL/min    Comment: (NOTE) The eGFR has been calculated using the CKD EPI equation. This calculation has not been validated in all clinical situations. eGFR's persistently <60 mL/min signify possible Chronic Kidney Disease.    Anion gap 8 5 - 15  Glucose, capillary     Status: Abnormal   Collection Time: 04/15/15 11:29 AM  Result Value Ref Range   Glucose-Capillary 244 (H) 65 - 99 mg/dL  Glucose, capillary     Status: Abnormal   Collection Time: 04/15/15  4:31 PM  Result Value Ref Range   Glucose-Capillary 139 (H) 65 - 99 mg/dL  Glucose, capillary     Status: Abnormal   Collection Time: 04/15/15  8:50 PM  Result Value Ref Range   Glucose-Capillary 160 (H) 65 - 99 mg/dL   Comment 1 Notify RN   Culture, Urine     Status: None (Preliminary result)   Collection Time: 04/16/15  2:04 AM  Result Value Ref Range   Specimen Description URINE, CATHETERIZED    Special Requests NONE    Culture >=100,000  COLONIES/mL PROTEUS MIRABILIS    Report Status PENDING   Glucose, capillary     Status: Abnormal   Collection Time: 04/16/15  6:32 AM  Result Value Ref Range   Glucose-Capillary 166 (H) 65 - 99 mg/dL   Comment 1 Notify RN   Glucose, capillary     Status: Abnormal   Collection Time: 04/16/15 11:17 AM  Result Value Ref Range   Glucose-Capillary 217 (H) 65 - 99 mg/dL  Glucose, capillary     Status: Abnormal   Collection Time: 04/16/15  4:16 PM  Result Value Ref Range   Glucose-Capillary 146 (H) 65 - 99 mg/dL  Glucose, capillary     Status: Abnormal   Collection Time: 04/16/15  8:52 PM  Result Value Ref Range   Glucose-Capillary 169 (H) 65 - 99 mg/dL  Glucose, capillary     Status: Abnormal   Collection Time: 04/17/15  6:37 AM  Result Value Ref Range   Glucose-Capillary 151 (H) 65 - 99 mg/dL   Comment 1 Notify RN   Glucose, capillary     Status: Abnormal   Collection Time: 04/17/15 11:22 AM  Result Value Ref Range   Glucose-Capillary 207 (H) 65 - 99 mg/dL  Glucose, capillary     Status: Abnormal   Collection Time: 04/17/15  4:25 PM  Result Value Ref Range   Glucose-Capillary 180 (H) 65 - 99 mg/dL  Glucose, capillary     Status: Abnormal   Collection Time: 04/17/15  8:40 PM  Result Value Ref Range   Glucose-Capillary 225 (H) 65 - 99 mg/dL  Glucose, capillary     Status: Abnormal   Collection Time: 04/18/15  6:28 AM  Result Value Ref Range   Glucose-Capillary 178 (H) 65 - 99 mg/dL     HEENT: normal Cardio: RRR and diastolic murmur Resp: CTA B/L and unlabored GI: BS positive and non distended Extremity:  No Edema Skin:   Intact Neuro: pleasantly Confused, Flat, Cranial Nerve II-XII normal, Normal Sensory, Abnormal Motor 3- Left delt bi tri 2-grip 3- L HF, 4- KE , 3- ankle DF, 5/5 on Right side and Abnormal FMC Ataxic/ dec FMC Musc/Skel:  Left shoulder pain ith all movement incl ext rotation Gen NAD   Assessment/Plan: 1. Functional deficits secondary to right caudate  intercerebral hemorrhage secondary to hypertensive crisis with left hemiparesis which require 3+ hours per day of interdisciplinary therapy in a comprehensive inpatient rehab setting. Physiatrist is providing close team supervision and 24 hour management of active medical problems listed below. Physiatrist and rehab team continue to assess barriers to discharge/monitor patient progress toward functional and medical goals. FIM: Function - Bathing Position: Wheelchair/chair at sink Body parts bathed by patient: Right arm, Left arm, Chest, Abdomen, Front perineal area, Right upper leg, Left upper leg Body parts bathed by helper: Buttocks, Right lower leg, Left lower leg, Back Bathing not applicable: Front perineal area, Buttocks (pt has been cleansed prior to session) Assist Level: Touching or steadying assistance(Pt > 75%)  Function- Upper Body Dressing/Undressing What is the patient wearing?: Pull over shirt/dress Bra - Perfomed by helper: Thread/unthread right bra strap, Thread/unthread left bra strap, Hook/unhook bra (pull down sports bra) Pull over shirt/dress - Perfomed by helper: Thread/unthread right sleeve, Thread/unthread left sleeve, Put head through opening, Pull shirt over trunk Button up shirt - Perfomed by patient: Thread/unthread right sleeve Button up shirt - Perfomed by helper: Thread/unthread left sleeve, Pull shirt around back, Button/unbutton shirt Assist Level: Touching or steadying assistance(Pt > 75%) Function - Lower Body Dressing/Undressing What is the patient wearing?: Underwear, Pants, Non-skid slipper socks Position: Wheelchair/chair at sink Underwear - Performed by helper: Thread/unthread right underwear leg, Thread/unthread left underwear leg, Pull underwear up/down Pants- Performed by helper: Thread/unthread right pants leg, Thread/unthread left pants leg, Pull pants up/down Non-skid slipper socks- Performed by helper: Don/doff right sock, Don/doff left  sock Assist Level: Touching or steadying assistance (Pt > 75%)  Function - Toileting Toileting activity did not occur: No continent bowel/bladder event Toileting steps completed by patient: Performs perineal hygiene Toileting steps completed by helper: Adjust clothing prior to toileting, Adjust clothing after toileting Toileting Assistive Devices: Other (comment) (bed pan) Assist level: Touching or steadying assistance (Pt.75%)  Function - Toilet Transfers Toilet transfer activity did not occur: Safety/medical concerns Toilet transfer assistive device: Walker, Elevated toilet seat/BSC over toilet Assist level to toilet: Maximal assist (Pt 25 - 49%/lift and lower) Assist level from toilet: Moderate assist (Pt 50 - 74%/lift or lower) Assist level to bedside commode (at bedside): Maximal assist (Pt 25 - 49%/lift and lower) Assist level from bedside commode (at bedside): Maximal assist (Pt 25 - 49%/lift and lower)  Function - Chair/bed transfer Chair/bed transfer method: Ambulatory Chair/bed transfer assist  level: Moderate assist (Pt 50 - 74%/lift or lower) Chair/bed transfer assistive device: Walker, Armrests Chair/bed transfer details: Manual facilitation for placement, Manual facilitation for weight shifting, Verbal cues for sequencing, Verbal cues for technique  Function - Locomotion: Wheelchair Will patient use wheelchair at discharge?: Yes Type: Manual Max wheelchair distance: 33' Assist Level: Total assistance (Pt < 25%) Wheel 50 feet with 2 turns activity did not occur: Safety/medical concerns Wheel 150 feet activity did not occur: Safety/medical concerns Turns around,maneuvers to table,bed, and toilet,negotiates 3% grade,maneuvers on rugs and over doorsills: No Function - Locomotion: Ambulation Assistive device: Walker-rolling Max distance: 100' Assist level: 2 helpers (mod A from PT; +2 for w/c follow) Assist level: Moderate assist (Pt 50 - 74%) Assist level: 2  helpers Walk 150 feet activity did not occur: Safety/medical concerns Assist level: Maximal assist (Pt 25 - 49%)  Function - Comprehension Comprehension: Auditory Comprehension assist level: Understands basic 25 - 49% of the time/ requires cueing 50 - 75% of the time  Function - Expression Expression: Verbal Expression assist level: Expresses basic 25 - 49% of the time/requires cueing 50 - 75% of the time. Uses single words/gestures.  Function - Social Interaction Social Interaction assist level: Interacts appropriately 25 - 49% of time - Needs frequent redirection.  Function - Problem Solving Problem solving assist level: Solves basic less than 25% of the time - needs direction nearly all the time or does not effectively solve problems and may need a restraint for safety  Function - Memory Memory assist level: Recognizes or recalls 25 - 49% of the time/requires cueing 50 - 75% of the time Patient normally able to recall (first 3 days only): None of the above  Medical Problem List and Plan: 1. Functional deficits secondary to right caudate intercerebral hemorrhage secondary to hypertensive crisis 2. DVT Prophylaxis/Anticoagulation: Subcutaneous heparin added for DVT prophylaxis 04/08/2015 3. Pain Management: Tylenol as needed, post stroke shoulder pain, limited ROM, developing spasticity, start with local measures, aggressive PT/OT, tramadol for pain 4. Hypertension. Clonidine 0.2 mg 3 times a day, Cardizem 240 mg daily, lisinopril 20 mg twice a day, Lopressor 25 mg twice a day. Will allow a degree of "permissive hypertension" to maintain perfusion  5. Neuropsych: This patient is capable of making decisions on her own behalf. 6. Skin/Wound Care: Routine skin checks 7. Fluids/Electrolytes/Nutrition: Routine I&O with follow-up chemistries, meal intake 20-30%, Intake ~337m fluid 8. Systolic congestive heart failure. Check daily weights and I's and O's. Patient on Lasix 40 mg daily prior  to admission. Resume as needed currently fluid intake very low , minimal pedal edema 9. Atrial fibrillation. Chronic Coumadin discontinued secondary to intracerebral hemorrhage. Cardiac rate controlled. Continue Cardizem and Lopressor 10. Diabetes mellitus peripheral neuropathy. Hemoglobin A1c 5.9. Sliding scale insulin. Check blood sugars before meals and at bedtime. Patient on Glucophage 500 mg twice a day prior to admission.   -am glucophage may need to be initiated--observe for pattern 11. Chronic renal insufficiency. Baseline creatinine 1.35. Follow-up chemistries in am. Past labs all reviewed. 12. Hypothyroidism. TSH level 2.053. Continue Synthroid 13. History of gout. Zyloprim 100 mg daily. Not active currently 14. Hyperlipidemia. Resume statin at discharge. 15.  Hypokalemia- cont KCL and do f/u BMET today 16. Urinary incontinence: timed voids. ucx pending (ua not sent) LOS (Days) 6 A FACE TO FACE EVALUATION WAS PERFORMED  Nicole [name] 04/18/2015, 7:12 AM

## 2015-04-18 NOTE — Progress Notes (Signed)
A&O to self. Poor initiation. Incontinent of urine, PVR=174ml. Drank 222ml of water at HS and ate 1/2 magic cup. Nicole [name] A

## 2015-04-18 NOTE — Progress Notes (Signed)
Speech Language Pathology Daily Session Note  Patient Details  Name: Nicole [name] MRN: [id number] Date of Birth: 1930/09/21  Today's Date: 04/18/2015 SLP Individual Time: 1410-1455 SLP Individual Time Calculation (min): 45 min and Today's Date: 04/18/2015 SLP Missed Time: 15 Minutes Missed Time Reason: Patient fatigue  Short Term Goals: Week 1: SLP Short Term Goal 1 (Week 1): Patient will utilize external aids/cues for orientation to place, time and situation with Max A multimodal cues.  SLP Short Term Goal 2 (Week 1): Patient will demonstrate sustained attention to functional tasks for 2 minutes with Max A multimodal cues.  SLP Short Term Goal 3 (Week 1): Patient will identify 1 physical and 1 cognitive deficit with Max A multimodal cues.  SLP Short Term Goal 4 (Week 1): Patient will initiate functional tasks in 50% of opportunities with Max A multimodal cues.  SLP Short Term Goal 5 (Week 1): Patient will consume current diet with minimal overt s/s of aspiration with Max A multimodal cues for use of swallow strategies.  SLP Short Term Goal 6 (Week 1): Patient will demonstrate efficient mastication of Dys. 2 textures with minimal overt s/s of aspiration across 3 sessions to assess readiness for upgrade with Max A multimodal cues.   Skilled Therapeutic Interventions: Skilled treatment session focused on addressing cognitive goals.  SLP facilitated session by re-orienting patient to place, situation and time.  Despite Max multimodal cues for use of external aids and repetition patient unable to recall throughout session.  SLP also facilitated session by providing Max assist verbal cues for brief periods of sustained attention, ~30 seconds during a basic problem solving solving task.  Continue with current plan of care.   Function:  Cognition Comprehension Comprehension assist level: Understands basic 25 - 49% of the time/ requires cueing 50 - 75% of the time  Expression   Expression assist  level: Expresses basic 25 - 49% of the time/requires cueing 50 - 75% of the time. Uses single words/gestures.  Social Interaction Social Interaction assist level: Interacts appropriately 25 - 49% of time - Needs frequent redirection.  Problem Solving Problem solving assist level: Solves basic less than 25% of the time - needs direction nearly all the time or does not effectively solve problems and may need a restraint for safety  Memory Memory assist level: Recognizes or recalls less than 25% of the time/requires cueing greater than 75% of the time    Pain Pain Assessment Pain Assessment: No/denies pain  Therapy/Group: Individual Therapy  Carmelia Roller., [place] [phone]  [place] 04/18/2015, 4:19 PM

## 2015-04-18 NOTE — Progress Notes (Signed)
Physical Therapy Session Note  Patient Details  Name: Nicole [name] MRN: [id number] Date of Birth: 1930-08-21  Today's Date: 04/18/2015 PT Individual Time: 1110-1207 PT Individual Time Calculation (min): 57 min   Short Term Goals: Week 1:  PT Short Term Goal 1 (Week 1): Pt will demonstrate side lie to sit req mod A consistently.  PT Short Term Goal 2 (Week 1): Pt will demonstrate sit to stand req min A.  PT Short Term Goal 3 (Week 1): Pt will demonstrate stand-step transfer req mod A.  PT Short Term Goal 4 (Week 1): Pt will ambulate 100' req mod A with RW PT Short Term Goal 5 (Week 1): Pt will demonstrate w/c propulsion x 50' req max A.   Skilled Therapeutic Interventions/Progress Updates:   Pt received in room with family present; pt more alert and responsive since Friday.  Pt transported to gym with total A.  Pt performed stand pivot w/c > mat with RW and mod-max A with verbal and tactile cues and increased time to initiate sit > stand and mod-max A for lateral weight shifting, postural and trunk control in standing to allow advancement of LLE due to L lateropulsion and decreased weight shift to R.  In sitting attempted to perform vestibular screening/evaluation but pt continues to demonstrate very short sustained attention and unable to attend to therapist or follow simple commands to complete assessment.Continued session with NMR; see below.  Returned to w/c side stepping with RW to R with continued focus on trunk control, keeping upright trunk with L shoulder and pelvis forward rotation, lateral weight shifting and initiation and advancement of LLE with mod-max A overall.  Pt left in room in w/c with quick release belt in place and family present.       Therapy Documentation Precautions:  Precautions Precautions: Fall Precaution Comments: L hemiplegia Restrictions Weight Bearing Restrictions: No Pain: Pain Assessment Pain Assessment: Faces Faces Pain Scale: Hurts whole lot Pain  Type: Acute pain Pain Location: Arm Pain Orientation: Left Pain Descriptors / Indicators: Jabbing Pain Frequency: Intermittent Pain Onset: With Activity Patients Stated Pain Goal: 3 Pain Intervention(s): Repositioned;Therapeutic touch;Shower Other Treatments: Treatments Neuromuscular Facilitation: Right;Left;Upper Extremity;Lower Extremity;Forced use;Activity to increase motor control;Activity to increase timing and sequencing;Activity to increase grading;Activity to increase sustained activation;Activity to increase lateral weight shifting;Activity to increase anterior-posterior weight shifting during functional reaching out of BOS in various directions and across midline with both R and LUE with focus on facilitation of weight shifting to R, trunk elongation on R side, shortening on L, increased extension through trunk and LLE, attention and visual scanning to L to find target, initiation and use of LUE, and sustained attention to task with extra time and mod verbal cues.    See Function Navigator for Current Functional Status.   Therapy/Group: Individual Therapy  Raylene Everts [facility] [place] 04/18/2015, 12:51 PM

## 2015-04-18 NOTE — Plan of Care (Signed)
Problem: RH PAIN MANAGEMENT Goal: RH STG PAIN MANAGED AT OR BELOW PT'S PAIN GOAL <4  Outcome: Progressing Reports pain <4

## 2015-04-19 ENCOUNTER — Inpatient Hospital Stay (HOSPITAL_COMMUNITY): Payer: Medicare Other | Admitting: Speech Pathology

## 2015-04-19 ENCOUNTER — Inpatient Hospital Stay (HOSPITAL_COMMUNITY): Payer: Medicare Other | Admitting: Occupational Therapy

## 2015-04-19 ENCOUNTER — Inpatient Hospital Stay (HOSPITAL_COMMUNITY): Payer: Medicare Other | Admitting: Physical Therapy

## 2015-04-19 ENCOUNTER — Inpatient Hospital Stay (HOSPITAL_COMMUNITY): Payer: Medicare Other

## 2015-04-19 ENCOUNTER — Inpatient Hospital Stay (HOSPITAL_COMMUNITY): Payer: Medicare Other | Admitting: *Deleted

## 2015-04-19 LAB — GLUCOSE, CAPILLARY
Glucose-Capillary: 155 mg/dL — ABNORMAL HIGH (ref 65–99)
Glucose-Capillary: 208 mg/dL — ABNORMAL HIGH (ref 65–99)
Glucose-Capillary: 203 mg/dL — ABNORMAL HIGH (ref 65–99)
Glucose-Capillary: 241 mg/dL — ABNORMAL HIGH (ref 65–99)

## 2015-04-19 NOTE — Progress Notes (Signed)
Speech Language Pathology Daily Session Note  Patient Details  Name: Nicole [name] MRN: [id number] Date of Birth: Nov 07, 1930  Today's Date: 04/19/2015 SLP Individual Time: 0071-2197 SLP Individual Time Calculation (min): 45 min  Short Term Goals: Week 1: SLP Short Term Goal 1 (Week 1): Patient will utilize external aids/cues for orientation to place, time and situation with Max A multimodal cues.  SLP Short Term Goal 2 (Week 1): Patient will demonstrate sustained attention to functional tasks for 2 minutes with Max A multimodal cues.  SLP Short Term Goal 3 (Week 1): Patient will identify 1 physical and 1 cognitive deficit with Max A multimodal cues.  SLP Short Term Goal 4 (Week 1): Patient will initiate functional tasks in 50% of opportunities with Max A multimodal cues.  SLP Short Term Goal 5 (Week 1): Patient will consume current diet with minimal overt s/s of aspiration with Max A multimodal cues for use of swallow strategies.  SLP Short Term Goal 6 (Week 1): Patient will demonstrate efficient mastication of Dys. 2 textures with minimal overt s/s of aspiration across 3 sessions to assess readiness for upgrade with Max A multimodal cues.   Skilled Therapeutic Interventions: Pt was seen for skilled ST targeting cognitive goals.  Upon arrival, pt was seated upright in recliner, awake, lethargic, but agreeable to participate in [place].  Pt was transferred to wheelchair and transported to [place] treatment room to maximize attention and alertness during structured tasks.  Pt required max-total assist for orientation to place and situation but was independently oriented to day of the week.  SLP facilitated the session with a basic card game targeting initiation and sustained attention to tasks.  Pt completed the abovementioned task with overall max to total assist to monitor and correct perseveration.  At the end of today's session, pt indicated that she would like to return to the recliner.  During transfer  back to recliner, pt had an episode of "freezing" and she required increased time and max assist multimodal cues for completion of transfer.  Pt's son and significant other were present throughout session; therefore, SLP reviewed and reinforced that pt will need 24/7 supervision at discharge due to cognitive deficits.  They both verbalized understanding.  Pt was left in recliner with visitors present and call bell within reach.  Continue per current plan of care.    Function:  Eating Eating   Modified Consistency Diet: Yes Eating Assist Level: Helper checks for pocketed food           Cognition Comprehension Comprehension assist level: Understands basic 25 - 49% of the time/ requires cueing 50 - 75% of the time  Expression   Expression assist level: Expresses basic 25 - 49% of the time/requires cueing 50 - 75% of the time. Uses single words/gestures.  Social Interaction Social Interaction assist level: Interacts appropriately 25 - 49% of time - Needs frequent redirection.  Problem Solving Problem solving assist level: Solves basic 25 - 49% of the time - needs direction more than half the time to initiate, plan or complete simple activities  Memory Memory assist level: Recognizes or recalls less than 25% of the time/requires cueing greater than 75% of the time    Pain Pain Assessment Pain Assessment: No/denies pain  Therapy/Group: Individual Therapy  Page, Selinda [place] 04/19/2015, 4:22 PM

## 2015-04-19 NOTE — Progress Notes (Signed)
Occupational Therapy Session Note  Patient Details  Name: Nicole [name] MRN: [id number] Date of Birth: July 18, 1931  Today's Date: 04/19/2015 OT Individual Time: 1000-1100 OT Individual Time Calculation (min): 60 min    Short Term Goals: Week 1:  OT Short Term Goal 1 (Week 1): Pt will be able to transfer to toilet with min A. OT Short Term Goal 2 (Week 1): Pt will be able to toilet with min A. OT Short Term Goal 3 (Week 1): Pt will be able to don shirt with min A. OT Short Term Goal 4 (Week 1): Pt will don pants with mod A. OT Short Term Goal 5 (Week 1): Pt will actively use LUE to wash R arm with min cues.  Skilled Therapeutic Interventions/Progress Updates:    Pt seen this session for ADL retraining with a focus on initiation, attention to task, orientation, balance.  Pt was not oriented to place, situation. She was quite convinced that this therapist was her relative. Therefore, pt needed needed max cues for every step throughout the ADL routine as she was distracted talking about people and situations she thought were with her or going on.   Pt was highly sensitive to touch to her LUE, grimacing with small movements or light touch. Pt said she hurt it falling in the ocean and continued to talk about the ocean. She did do very well with her sit><stand and standing balance, needing only min A to stand and complete stand pivot toilet transfers and to pull pants over hips.  Pt needing increased cues even with familiar tasks such as combing hair. Pt resting in w/c at end of session with call light in reach and quick release belt on.  Therapy Documentation Precautions:  Precautions Precautions: Fall Precaution Comments: L hemiplegia Restrictions Weight Bearing Restrictions: No    Vital Signs: Therapy Vitals Pulse Rate: 92 BP: (!) 148/92 mmHg Pain: Pain Assessment Pain Assessment: Faces Faces Pain Scale: Hurts whole lot Pain Type: Acute pain Pain Location: Arm Pain Orientation:  Left Pain Descriptors / Indicators: Grimacing;Guarding;Discomfort Pain Onset: With Activity (with pushing up on arm or with light touch) Pain Intervention(s): RN made aware;Rest;Distraction;Repositioned ADL:    See Function Navigator for Current Functional Status.   Therapy/Group: Individual Therapy  SAGUIER,JULIA 04/19/2015, 12:59 PM

## 2015-04-19 NOTE — Progress Notes (Signed)
Speech Language Pathology Daily Session Note  Patient Details  Name: Nicole [name] MRN: [id number] Date of Birth: [birth year]  Today's Date: 04/19/2015 SLP Individual Time: 0800-0830 SLP Individual Time Calculation (min): 30 min  Short Term Goals: Week 1: SLP Short Term Goal 1 (Week 1): Patient will utilize external aids/cues for orientation to place, time and situation with Max A multimodal cues.  SLP Short Term Goal 2 (Week 1): Patient will demonstrate sustained attention to functional tasks for 2 minutes with Max A multimodal cues.  SLP Short Term Goal 3 (Week 1): Patient will identify 1 physical and 1 cognitive deficit with Max A multimodal cues.  SLP Short Term Goal 4 (Week 1): Patient will initiate functional tasks in 50% of opportunities with Max A multimodal cues.  SLP Short Term Goal 5 (Week 1): Patient will consume current diet with minimal overt s/s of aspiration with Max A multimodal cues for use of swallow strategies.  SLP Short Term Goal 6 (Week 1): Patient will demonstrate efficient mastication of Dys. 2 textures with minimal overt s/s of aspiration across 3 sessions to assess readiness for upgrade with Max A multimodal cues.   Skilled Therapeutic Interventions: Skilled treatment session focused on cognitive goals. Upon arrival, patient was awake while supine in bed and agreeable to participate. Patient was incontinent of urine and required extra time and Max A multimodal cues to complete bed mobility while changing her brief and donning a new gown. Patient was transferred to the wheelchair to maximize arousal and required Max A multimodal cues for problem solving and sequencing with basic self-care tasks (brushing teeth and hair, washing face, etc.).  Patient also required total A for orientation to place and time. Patient left upright in wheelchair and handed off to NT. Continue with current plan of care.    Function:  Cognition Comprehension Comprehension assist level:  Understands basic 25 - 49% of the time/ requires cueing 50 - 75% of the time  Expression   Expression assist level: Expresses basic 25 - 49% of the time/requires cueing 50 - 75% of the time. Uses single words/gestures.  Social Interaction Social Interaction assist level: Interacts appropriately 25 - 49% of time - Needs frequent redirection.  Problem Solving Problem solving assist level: Solves basic less than 25% of the time - needs direction nearly all the time or does not effectively solve problems and may need a restraint for safety  Memory Memory assist level: Recognizes or recalls less than 25% of the time/requires cueing greater than 75% of the time    Pain Pain Assessment Pain Assessment: Faces Faces Pain Scale: Hurts whole lot Pain Type: Acute pain Pain Location: Arm Pain Orientation: Left Pain Descriptors / Indicators: Grimacing;Guarding;Discomfort Pain Onset: With Activity (with pushing up on arm or with light touch) Pain Intervention(s): RN made aware;Rest;Distraction;Repositioned  Therapy/Group: Individual Therapy  PAYNE, COURTNEY 04/19/2015, 2:44 PM

## 2015-04-19 NOTE — Progress Notes (Signed)
Physical Therapy Note  Patient Details  Name: MERRYN THAKER MRN: [id number] Date of Birth: 23-Aug-1930 Today's Date: 04/19/2015    Time: 1300-1330 30 minutes  1:1 No c/o pain.  SPT multiple attempts with focus on initiation, lifting feet. Pt requires min manual facilitation for wt shifts to turn, mod verbal and visual cues for initiation.  Standing balance with squat and reach task with min A for balance, decreased retropulsion with squatting and forward reaching activities.  Stepping/wt shifting with HHA, min A.  Improved with repetition for wt shift to R before stepping with L LE.   DONAWERTH,KAREN 04/19/2015, 2:51 PM

## 2015-04-19 NOTE — Progress Notes (Addendum)
Physical Therapy Session Note  Patient Details  Name: Nicole [name] MRN: [id number] Date of Birth: 1931-02-15  Today's Date: 04/19/2015 PT Individual Time: 1114-1200 PT Individual Time Calculation (min): 46 min   Short Term Goals: Week 1:  PT Short Term Goal 1 (Week 1): Pt will demonstrate side lie to sit req mod A consistently.  PT Short Term Goal 2 (Week 1): Pt will demonstrate sit to stand req min A.  PT Short Term Goal 3 (Week 1): Pt will demonstrate stand-step transfer req mod A.  PT Short Term Goal 4 (Week 1): Pt will ambulate 100' req mod A with RW PT Short Term Goal 5 (Week 1): Pt will demonstrate w/c propulsion x 50' req max A.   Skilled Therapeutic Interventions/Progress Updates:   Pt received in w/c.  Pt transported to ortho gym in w/c total A.  Performed car transfer training to focus on initiation, sequencing and weight shifting during stand pivot transfers w/c <> car with UE support on door with mod A overall for sit <> stand and weight shifting while pivot with max verbal cues for sequencing pivot.  Pt able to place LE into and out of car with min A.  Pt required increased time and cues to initiate sit>stand from car to transfer back to w/c.  Son present during second half of session; confirmed stair set up at home for entry: 3 STE with 2 rails.  Performed stair negotiation training up/down 4 stairs with 2 rails with pt performing step to sequence leading with R and with LLE to focus on lateral weight shifting, initiation and advancement of each LE with mod A overall for balance and forwards weight shifting; max cues for sequencing.  Returned to room and performed stand pivot to recliner with HHA and mod A with verbal cues and facilitation of weight shifting and LE advancement.  Pt to eat lunch in and rest in recliner vs. W/c.  Pt left with son in room and all items within reach.  Therapy Documentation Precautions:  Precautions Precautions: Fall Precaution Comments: L  hemiplegia Restrictions Weight Bearing Restrictions: No Vital Signs: Therapy Vitals Pulse Rate: 92 BP: (!) 148/92 mmHg Pain: Pt reporting pain/sensitivity to touch in LUE/hand but no pain with purposeful movement.  See Function Navigator for Current Functional Status.   Therapy/Group: Individual Therapy  Raylene Everts Faucette 04/19/2015, 12:12 PM

## 2015-04-19 NOTE — Progress Notes (Addendum)
Patient ID: Nicole [name], female   DOB: 04-24-1931, 81 y.o.   MRN: [id number]   Subjective/Complaints: Where's the train? Pt oriented to hosp not time, thinks a train is bringing her children and husband  ROS- neg bowel, bladder breathing or pain issues, limited due to cognition Objective: Vital Signs: Blood pressure 124/70, pulse 77, temperature 98.4 F (36.9 C), temperature source Oral, resp. rate 17, height 5' 9"  (1.753 m), weight 79.85 kg (176 lb 0.6 oz), SpO2 98 %. No results found. Results for orders placed or performed during the hospital encounter of 04/12/15 (from the past 72 hour(s))  Glucose, capillary     Status: Abnormal   Collection Time: 04/16/15 11:17 AM  Result Value Ref Range   Glucose-Capillary 217 (H) 65 - 99 mg/dL  Glucose, capillary     Status: Abnormal   Collection Time: 04/16/15  4:16 PM  Result Value Ref Range   Glucose-Capillary 146 (H) 65 - 99 mg/dL  Glucose, capillary     Status: Abnormal   Collection Time: 04/16/15  8:52 PM  Result Value Ref Range   Glucose-Capillary 169 (H) 65 - 99 mg/dL  Glucose, capillary     Status: Abnormal   Collection Time: 04/17/15  6:37 AM  Result Value Ref Range   Glucose-Capillary 151 (H) 65 - 99 mg/dL   Comment 1 Notify RN   Glucose, capillary     Status: Abnormal   Collection Time: 04/17/15 11:22 AM  Result Value Ref Range   Glucose-Capillary 207 (H) 65 - 99 mg/dL  Glucose, capillary     Status: Abnormal   Collection Time: 04/17/15  4:25 PM  Result Value Ref Range   Glucose-Capillary 180 (H) 65 - 99 mg/dL  Glucose, capillary     Status: Abnormal   Collection Time: 04/17/15  8:40 PM  Result Value Ref Range   Glucose-Capillary 225 (H) 65 - 99 mg/dL  Glucose, capillary     Status: Abnormal   Collection Time: 04/18/15  6:28 AM  Result Value Ref Range   Glucose-Capillary 178 (H) 65 - 99 mg/dL  Basic metabolic panel     Status: Abnormal   Collection Time: 04/18/15 10:36 AM  Result Value Ref Range   Sodium 141 135  - 145 mmol/L   Potassium 4.2 3.5 - 5.1 mmol/L   Chloride 108 101 - 111 mmol/L   CO2 24 22 - 32 mmol/L   Glucose, Bld 306 (H) 65 - 99 mg/dL   BUN 27 (H) 6 - 20 mg/dL   Creatinine, Ser 1.40 (H) 0.44 - 1.00 mg/dL   Calcium 9.6 8.9 - 10.3 mg/dL   GFR calc non Af Amer 34 (L) >60 mL/min   GFR calc Af Amer 39 (L) >60 mL/min    Comment: (NOTE) The eGFR has been calculated using the CKD EPI equation. This calculation has not been validated in all clinical situations. eGFR's persistently <60 mL/min signify possible Chronic Kidney Disease.    Anion gap 9 5 - 15  Glucose, capillary     Status: Abnormal   Collection Time: 04/18/15 12:08 PM  Result Value Ref Range   Glucose-Capillary 252 (H) 65 - 99 mg/dL   Comment 1 Document in Chart   Glucose, capillary     Status: Abnormal   Collection Time: 04/18/15  4:28 PM  Result Value Ref Range   Glucose-Capillary 128 (H) 65 - 99 mg/dL   Comment 1 Notify RN   Glucose, capillary     Status: Abnormal  Collection Time: 04/18/15  8:24 PM  Result Value Ref Range   Glucose-Capillary 161 (H) 65 - 99 mg/dL     HEENT: normal Cardio: IRRR and diastolic murmur Resp: CTA B/L and unlabored GI: BS positive and non distended Extremity:  No Edema Skin:   Intact Neuro: pleasantly Confused, Flat, Cranial Nerve II-XII normal, Normal Sensory, Abnormal Motor 3- Left delt bi tri 2-grip 3- L HF, 4- KE , 3- ankle DF, 5/5 on Right side and Abnormal FMC Ataxic/ dec FMC Musc/Skel:  Left shoulder pain with all movement incl ext rotation Gen NAD   Assessment/Plan: 1. Functional deficits secondary to right caudate intercerebral hemorrhage secondary to hypertensive crisis with left hemiparesis which require 3+ hours per day of interdisciplinary therapy in a comprehensive inpatient rehab setting. Physiatrist is providing close team supervision and 24 hour management of active medical problems listed below. Physiatrist and rehab team continue to assess barriers to  discharge/monitor patient progress toward functional and medical goals. FIM: Function - Bathing Position: Shower Body parts bathed by patient: Right arm, Left arm, Chest, Abdomen, Front perineal area, Right upper leg, Left upper leg, Right lower leg Body parts bathed by helper: Back, Buttocks, Left lower leg Bathing not applicable: Front perineal area, Buttocks (pt has been cleansed prior to session by nursing) Assist Level: Touching or steadying assistance(Pt > 75%)  Function- Upper Body Dressing/Undressing What is the patient wearing?: Bra, Pull over shirt/dress Bra - Perfomed by patient: Thread/unthread right bra strap Bra - Perfomed by helper: Thread/unthread left bra strap, Hook/unhook bra (pull down sports bra) Pull over shirt/dress - Perfomed by patient: Thread/unthread right sleeve Pull over shirt/dress - Perfomed by helper: Thread/unthread left sleeve, Put head through opening, Pull shirt over trunk Button up shirt - Perfomed by patient: Thread/unthread right sleeve Button up shirt - Perfomed by helper: Thread/unthread left sleeve, Pull shirt around back, Button/unbutton shirt Assist Level: Touching or steadying assistance(Pt > 75%) Function - Lower Body Dressing/Undressing What is the patient wearing?: Underwear, Pants, Non-skid slipper socks Position: Wheelchair/chair at sink Underwear - Performed by helper: Thread/unthread right underwear leg, Thread/unthread left underwear leg, Pull underwear up/down Pants- Performed by helper: Thread/unthread right pants leg, Thread/unthread left pants leg, Pull pants up/down Non-skid slipper socks- Performed by helper: Don/doff right sock, Don/doff left sock Assist Level: Touching or steadying assistance (Pt > 75%)  Function - Toileting Toileting activity did not occur: No continent bowel/bladder event Toileting steps completed by patient: Performs perineal hygiene Toileting steps completed by helper: Adjust clothing prior to toileting,  Adjust clothing after toileting Toileting Assistive Devices: Other (comment) (bed pan) Assist level: Touching or steadying assistance (Pt.75%)  Function - Toilet Transfers Toilet transfer activity did not occur: Safety/medical concerns Toilet transfer assistive device: Walker, Elevated toilet seat/BSC over toilet Assist level to toilet: Maximal assist (Pt 25 - 49%/lift and lower) Assist level from toilet: Moderate assist (Pt 50 - 74%/lift or lower) Assist level to bedside commode (at bedside): Maximal assist (Pt 25 - 49%/lift and lower) Assist level from bedside commode (at bedside): Maximal assist (Pt 25 - 49%/lift and lower)  Function - Chair/bed transfer Chair/bed transfer method: Ambulatory, Stand pivot Chair/bed transfer assist level: Moderate assist (Pt 50 - 74%/lift or lower) Chair/bed transfer assistive device: Walker, Armrests Chair/bed transfer details: Manual facilitation for placement, Manual facilitation for weight shifting, Verbal cues for sequencing, Verbal cues for technique  Function - Locomotion: Wheelchair Will patient use wheelchair at discharge?: Yes Type: Manual Max wheelchair distance: 66' Assist Level: Dependent (Pt  equals 0%) Wheel 50 feet with 2 turns activity did not occur: Safety/medical concerns Assist Level: Dependent (Pt equals 0%) Wheel 150 feet activity did not occur: Safety/medical concerns Assist Level: Dependent (Pt equals 0%) Turns around,maneuvers to table,bed, and toilet,negotiates 3% grade,maneuvers on rugs and over doorsills: No Function - Locomotion: Ambulation Assistive device: Walker-rolling Max distance: 100' Assist level: 2 helpers (mod A from PT; +2 for w/c follow) Assist level: Moderate assist (Pt 50 - 74%) Assist level: 2 helpers Walk 150 feet activity did not occur: Safety/medical concerns Assist level: Maximal assist (Pt 25 - 49%)  Function - Comprehension Comprehension: Auditory Comprehension assist level: Understands basic 25  - 49% of the time/ requires cueing 50 - 75% of the time  Function - Expression Expression: Verbal Expression assist level: Expresses basic 25 - 49% of the time/requires cueing 50 - 75% of the time. Uses single words/gestures.  Function - Social Interaction Social Interaction assist level: Interacts appropriately 25 - 49% of time - Needs frequent redirection.  Function - Problem Solving Problem solving assist level: Solves basic less than 25% of the time - needs direction nearly all the time or does not effectively solve problems and may need a restraint for safety  Function - Memory Memory assist level: Recognizes or recalls less than 25% of the time/requires cueing greater than 75% of the time Patient normally able to recall (first 3 days only): None of the above  Medical Problem List and Plan: 1. Functional deficits secondary to right caudate intercerebral hemorrhage secondary to hypertensive crisis 2. DVT Prophylaxis/Anticoagulation: Subcutaneous heparin added for DVT prophylaxis 04/08/2015 3. Pain Management: Tylenol as needed, post stroke shoulder pain, limited ROM, developing spasticity, start with local measures, aggressive PT/OT, tramadol for pain 4. Hypertension. Clonidine 0.2 mg 3 times a day, Cardizem 240 mg daily, lisinopril 20 mg twice a day, Lopressor 25 mg twice a day. Will allow a degree of "permissive hypertension" to maintain perfusion  5. Neuropsych: This patient is capable of making decisions on her own behalf. 6. Skin/Wound Care: Routine skin checks 7. Fluids/Electrolytes/Nutrition: Routine I&O with follow-up chemistries, meal intake 10-50%, Intake ~344m fluid 8. Systolic congestive heart failure. Check daily weights and I's and O's. Patient on Lasix 40 mg daily prior to admission. Resume as needed currently fluid intake very low , minimal pedal edema 9. Atrial fibrillation. Chronic Coumadin discontinued secondary to intracerebral hemorrhage. Cardiac rate controlled.  Continue Cardizem and Lopressor 10. Diabetes mellitus peripheral neuropathy. Hemoglobin A1c 5.9. Sliding scale insulin. Check blood sugars before meals and at bedtime. Patient on Glucophage 500 mg twice a day prior to admission.   -am glucophage may need to be initiated--observe for pattern 11. Chronic renal insufficiency. Baseline creatinine 1.35. Follow-up chemistries in am. Close to baseline may be a little dry 12. Hypothyroidism. TSH level 2.053. Continue Synthroid 13. History of gout. Zyloprim 100 mg daily. Not active currently 14. Hyperlipidemia. Resume statin at discharge. 15.  Hypokalemia- cont KCL now corrected 16. Urinary incontinence: timed voids. ucx proteus S to Keflex LOS (Days) 7 A FACE TO FACE EVALUATION WAS PERFORMED  KIRSTEINS,ANDREW E 04/19/2015, 6:45 AM

## 2015-04-20 ENCOUNTER — Inpatient Hospital Stay (HOSPITAL_COMMUNITY): Payer: Medicare Other | Admitting: Speech Pathology

## 2015-04-20 ENCOUNTER — Inpatient Hospital Stay (HOSPITAL_COMMUNITY): Payer: Medicare Other | Admitting: Physical Therapy

## 2015-04-20 ENCOUNTER — Inpatient Hospital Stay (HOSPITAL_COMMUNITY): Payer: Medicare Other | Admitting: Occupational Therapy

## 2015-04-20 DIAGNOSIS — I6931 Cognitive deficits following cerebral infarction: Secondary | ICD-10-CM

## 2015-04-20 LAB — GLUCOSE, CAPILLARY
GLUCOSE-CAPILLARY: 161 mg/dL — AB (ref 65–99)
GLUCOSE-CAPILLARY: 182 mg/dL — AB (ref 65–99)
Glucose-Capillary: 190 mg/dL — ABNORMAL HIGH (ref 65–99)
Glucose-Capillary: 213 mg/dL — ABNORMAL HIGH (ref 65–99)

## 2015-04-20 MED ORDER — MAGNESIUM CITRATE PO SOLN
1.0000 | Freq: Once | ORAL | Status: AC
  Administered 2015-04-20: 1 via ORAL
  Filled 2015-04-20: qty 296

## 2015-04-20 MED ORDER — ENOXAPARIN SODIUM 40 MG/0.4ML ~~LOC~~ SOLN
40.0000 mg | SUBCUTANEOUS | Status: DC
  Administered 2015-04-20 – 2015-05-01 (×12): 40 mg via SUBCUTANEOUS
  Filled 2015-04-20 (×12): qty 0.4

## 2015-04-20 MED ORDER — BISACODYL 10 MG RE SUPP
10.0000 mg | Freq: Every day | RECTAL | Status: DC | PRN
  Administered 2015-04-21: 10 mg via RECTAL
  Filled 2015-04-20: qty 1

## 2015-04-20 MED ORDER — SODIUM CHLORIDE 0.45 % IV SOLN
INTRAVENOUS | Status: DC
  Administered 2015-04-20 – 2015-04-24 (×7): via INTRAVENOUS
  Administered 2015-04-25: 1000 mL via INTRAVENOUS
  Administered 2015-04-26 – 2015-04-28 (×3): via INTRAVENOUS

## 2015-04-20 NOTE — Plan of Care (Signed)
Problem: RH BOWEL ELIMINATION Goal: RH STG MANAGE BOWEL W/MEDICATION W/ASSISTANCE STG Manage Bowel with Medication with Assistance. Mod A  Outcome: Not Progressing No bm x3d, sorbitol given

## 2015-04-20 NOTE — Progress Notes (Signed)
Patient with no bm x3 days, sorbitol given @ 10 pm. No results as of this time.

## 2015-04-20 NOTE — Plan of Care (Signed)
Problem: RH Bed Mobility Goal: LTG Patient will perform bed mobility with assist (PT) LTG: Patient will perform bed mobility with assistance, with/without cues (PT).  Goal downgraded due to slow progress.   Problem: RH Bed to Chair Transfers Goal: LTG Patient will perform bed/chair transfers w/assist (PT) LTG: Patient will perform bed/chair transfers with assistance, with/without cues (PT).  Goal downgraded due to slow progress  Problem: RH Furniture Transfers Goal: LTG Patient will perform furniture transfers w/assist (OT/PT LTG: Patient will perform furniture transfers with assistance (OT/PT).  Goal downgraded due to slow progress.   Problem: RH Ambulation Goal: LTG Patient will ambulate in community environment (PT) LTG: Patient will ambulate in community environment, # of feet with assistance (PT).  Goal discharged due to slow progress.   Problem: RH Wheelchair Mobility Goal: LTG Patient will propel w/c in controlled environment (PT) LTG: Patient will propel wheelchair in controlled environment, # of feet with assist (PT)  Goal downgraded due to slow progress Goal: LTG Patient will propel w/c in home environment (PT) LTG: Patient will propel wheelchair in home environment, # of feet with assistance (PT).  Goal downgraded due to slow progress  Problem: RH Stairs Goal: LTG Patient will ambulate up and down stairs w/assist (PT) LTG: Patient will ambulate up and down # of stairs with assistance (PT)  Goal edited to accurately reflect home environment.

## 2015-04-20 NOTE — Plan of Care (Signed)
Problem: RH Balance Goal: LTG Patient will maintain dynamic standing with ADLs (OT) LTG: Patient will maintain dynamic standing balance with assist during activities of daily living (OT)  LTG goals related to balance, bathing, UB/LB dressing, toileting, toilet transfers and tub shower goals downgraded due to cognitive deficits and requiring so much assistance at this time. Anticipate will need physical assistance at d/c.

## 2015-04-20 NOTE — Progress Notes (Signed)
Patient ID: Nicole [name], female   DOB: May 19, 1931, 81 y.o.   MRN: [id number]  RN documents:  Patient with no bm x3 days, sorbitol given @ 10 pm. No results as of this time. Subjective/Complaints: No abd pain  Need to get my brother a train ticket  ROS- neg bowel, bladder breathing or pain issues, limited due to cognition Objective: Vital Signs: Blood pressure 155/95, pulse 71, temperature 98.8 F (37.1 C), temperature source Oral, resp. rate 18, height 5' 9"  (1.753 m), weight 78.926 kg (174 lb), SpO2 96 %. Dg Shoulder Left  04/19/2015   CLINICAL DATA:  Left shoulder pain after possible fall. Initial encounter.  EXAM: LEFT SHOULDER - 2+ VIEW  COMPARISON:  None.  FINDINGS: There is no evidence of fracture or dislocation. Mild narrowing and osteophyte formation of the glenohumeral joint is noted. Moderate narrowing of the acromioclavicular joint is noted. Soft tissues are unremarkable.  IMPRESSION: Degenerative joint disease of the left acromioclavicular and glenohumeral joints. No acute abnormality seen in the left shoulder.   Electronically Signed   By: Marijo Conception, M.D.   On: 04/19/2015 08:01   Results for orders placed or performed during the hospital encounter of 04/12/15 (from the past 72 hour(s))  Glucose, capillary     Status: Abnormal   Collection Time: 04/17/15 11:22 AM  Result Value Ref Range   Glucose-Capillary 207 (H) 65 - 99 mg/dL  Glucose, capillary     Status: Abnormal   Collection Time: 04/17/15  4:25 PM  Result Value Ref Range   Glucose-Capillary 180 (H) 65 - 99 mg/dL  Glucose, capillary     Status: Abnormal   Collection Time: 04/17/15  8:40 PM  Result Value Ref Range   Glucose-Capillary 225 (H) 65 - 99 mg/dL  Glucose, capillary     Status: Abnormal   Collection Time: 04/18/15  6:28 AM  Result Value Ref Range   Glucose-Capillary 178 (H) 65 - 99 mg/dL  Basic metabolic panel     Status: Abnormal   Collection Time: 04/18/15 10:36 AM  Result Value Ref Range    Sodium 141 135 - 145 mmol/L   Potassium 4.2 3.5 - 5.1 mmol/L   Chloride 108 101 - 111 mmol/L   CO2 24 22 - 32 mmol/L   Glucose, Bld 306 (H) 65 - 99 mg/dL   BUN 27 (H) 6 - 20 mg/dL   Creatinine, Ser 1.40 (H) 0.44 - 1.00 mg/dL   Calcium 9.6 8.9 - 10.3 mg/dL   GFR calc non Af Amer 34 (L) >60 mL/min   GFR calc Af Amer 39 (L) >60 mL/min    Comment: (NOTE) The eGFR has been calculated using the CKD EPI equation. This calculation has not been validated in all clinical situations. eGFR's persistently <60 mL/min signify possible Chronic Kidney Disease.    Anion gap 9 5 - 15  Glucose, capillary     Status: Abnormal   Collection Time: 04/18/15 12:08 PM  Result Value Ref Range   Glucose-Capillary 252 (H) 65 - 99 mg/dL   Comment 1 Document in Chart   Glucose, capillary     Status: Abnormal   Collection Time: 04/18/15  4:28 PM  Result Value Ref Range   Glucose-Capillary 128 (H) 65 - 99 mg/dL   Comment 1 Notify RN   Glucose, capillary     Status: Abnormal   Collection Time: 04/18/15  8:24 PM  Result Value Ref Range   Glucose-Capillary 161 (H) 65 - 99 mg/dL  Glucose, capillary     Status: Abnormal   Collection Time: 04/19/15  6:48 AM  Result Value Ref Range   Glucose-Capillary 155 (H) 65 - 99 mg/dL  Glucose, capillary     Status: Abnormal   Collection Time: 04/19/15 11:12 AM  Result Value Ref Range   Glucose-Capillary 208 (H) 65 - 99 mg/dL  Glucose, capillary     Status: Abnormal   Collection Time: 04/19/15  4:34 PM  Result Value Ref Range   Glucose-Capillary 203 (H) 65 - 99 mg/dL  Glucose, capillary     Status: Abnormal   Collection Time: 04/19/15  9:19 PM  Result Value Ref Range   Glucose-Capillary 241 (H) 65 - 99 mg/dL  Glucose, capillary     Status: Abnormal   Collection Time: 04/20/15  6:27 AM  Result Value Ref Range   Glucose-Capillary 190 (H) 65 - 99 mg/dL     HEENT: normal Cardio: IRRR and diastolic murmur Resp: CTA B/L and unlabored GI: BS positive and non  distended Extremity:  No Edema Skin:   Intact Neuro: pleasantly Confused, Flat, Cranial Nerve II-XII normal, Normal Sensory, Abnormal Motor 3- Left delt bi tri 2-grip 3- L HF, 4- KE , 3- ankle DF, 5/5 on Right side and Abnormal FMC Ataxic/ dec FMC Musc/Skel:  Left shoulder pain with all movement incl ext rotation Gen NAD   Assessment/Plan: 1. Functional deficits secondary to right caudate intercerebral hemorrhage secondary to hypertensive crisis with left hemiparesis and cognitive deficits which require 3+ hours per day of interdisciplinary therapy in a comprehensive inpatient rehab setting. Physiatrist is providing close team supervision and 24 hour management of active medical problems listed below. Physiatrist and rehab team continue to assess barriers to discharge/monitor patient progress toward functional and medical goals.  Team conference today please see physician documentation under team conference tab, met with team face-to-face to discuss problems,progress, and goals. Formulized individual treatment plan based on medical history, underlying problem and comorbidities. FIM: Function - Bathing Position: Other (comment) (sitting on toilet) Body parts bathed by patient: Right arm, Left arm, Chest, Abdomen, Front perineal area, Right upper leg, Left upper leg, Buttocks Body parts bathed by helper: Right lower leg, Left lower leg, Back Bathing not applicable: Front perineal area, Buttocks (pt has been cleansed prior to session by nursing) Assist Level: Touching or steadying assistance(Pt > 75%)  Function- Upper Body Dressing/Undressing What is the patient wearing?: Bra, Pull over shirt/dress Bra - Perfomed by patient: Thread/unthread right bra strap Bra - Perfomed by helper: Thread/unthread left bra strap, Hook/unhook bra (pull down sports bra), Thread/unthread right bra strap Pull over shirt/dress - Perfomed by patient: Thread/unthread right sleeve Pull over shirt/dress - Perfomed by  helper: Thread/unthread left sleeve, Put head through opening, Pull shirt over trunk Button up shirt - Perfomed by patient: Thread/unthread right sleeve Button up shirt - Perfomed by helper: Thread/unthread left sleeve, Pull shirt around back, Button/unbutton shirt Assist Level: Touching or steadying assistance(Pt > 75%) Function - Lower Body Dressing/Undressing What is the patient wearing?: Pants, Maryln Manuel, Shoes Position: Education officer, museum at [employer] - Performed by helper: Thread/unthread right underwear leg, Thread/unthread left underwear leg, Pull underwear up/down Pants- Performed by helper: Thread/unthread right pants leg, Thread/unthread left pants leg, Pull pants up/down Non-skid slipper socks- Performed by helper: Don/doff right sock, Don/doff left sock Shoes - Performed by helper: Don/doff right shoe, Don/doff left shoe, Fasten right, Fasten left TED Hose - Performed by helper: Don/doff right TED hose, Don/doff left TED hose Assist  Level: Touching or steadying assistance (Pt > 75%)  Function - Toileting Toileting activity did not occur: No continent bowel/bladder event Toileting steps completed by patient: Performs perineal hygiene Toileting steps completed by helper: Adjust clothing prior to toileting, Adjust clothing after toileting Toileting Assistive Devices: Other (comment) (bed pan) Assist level: Touching or steadying assistance (Pt.75%)  Function - Air cabin crew transfer activity did not occur: Safety/medical concerns Toilet transfer assistive device: Grab bar, Elevated toilet seat/BSC over toilet Assist level to toilet: Moderate assist (Pt 50 - 74%/lift or lower) Assist level from toilet: Moderate assist (Pt 50 - 74%/lift or lower) Assist level to bedside commode (at bedside): Maximal assist (Pt 25 - 49%/lift and lower) Assist level from bedside commode (at bedside): Maximal assist (Pt 25 - 49%/lift and lower)  Function - Chair/bed transfer Chair/bed  transfer method: Stand pivot Chair/bed transfer assist level: Moderate assist (Pt 50 - 74%/lift or lower) Chair/bed transfer assistive device: Armrests Chair/bed transfer details: Manual facilitation for placement, Manual facilitation for weight shifting, Verbal cues for sequencing, Verbal cues for technique  Function - Locomotion: Wheelchair Will patient use wheelchair at discharge?: Yes Type: Manual Max wheelchair distance: 90' Assist Level: Dependent (Pt equals 0%) Wheel 50 feet with 2 turns activity did not occur: Safety/medical concerns Assist Level: Dependent (Pt equals 0%) Wheel 150 feet activity did not occur: Safety/medical concerns Assist Level: Dependent (Pt equals 0%) Turns around,maneuvers to table,bed, and toilet,negotiates 3% grade,maneuvers on rugs and over doorsills: No Function - Locomotion: Ambulation Assistive device: Walker-rolling Max distance: 100' Assist level: 2 helpers (mod A from PT; +2 for w/c follow) Assist level: Moderate assist (Pt 50 - 74%) Assist level: 2 helpers Walk 150 feet activity did not occur: Safety/medical concerns Assist level: Maximal assist (Pt 25 - 49%)  Function - Comprehension Comprehension: Auditory Comprehension assist level: Understands basic 25 - 49% of the time/ requires cueing 50 - 75% of the time  Function - Expression Expression: Verbal Expression assist level: Expresses basic 25 - 49% of the time/requires cueing 50 - 75% of the time. Uses single words/gestures.  Function - Social Interaction Social Interaction assist level: Interacts appropriately 25 - 49% of time - Needs frequent redirection.  Function - Problem Solving Problem solving assist level: Solves basic 25 - 49% of the time - needs direction more than half the time to initiate, plan or complete simple activities  Function - Memory Memory assist level: Recognizes or recalls less than 25% of the time/requires cueing greater than 75% of the time Patient normally  able to recall (first 3 days only): None of the above  Medical Problem List and Plan: 1. Functional deficits secondary to right caudate intercerebral hemorrhage secondary to hypertensive crisis 2. DVT Prophylaxis/Anticoagulation: Subcutaneous heparin added for DVT prophylaxis 04/08/2015, change to lovenox for improved efficacy and reduced needle sticks 3. 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 Hypertension. Clonidine 0.2 mg 3 times a day, Cardizem 240 mg daily, lisinopril 20 mg twice a day, Lopressor 25 mg twice a day. Will allow a degree of "permissive hypertension" to maintain perfusion  5. Neuropsych: This patient is capable of making decisions on her own behalf. 6. Skin/Wound Care: Routine skin checks 7. Fluids/Electrolytes/Nutrition: Routine I&O with follow-up chemistries, meal intake 10-50%, Intake ~363m fluid, IVF at noc 8. Systolic congestive heart failure. Check daily  weights and I's and O's. Patient on Lasix 40 mg daily prior to admission. Resume as needed currently fluid intake very low , minimal pedal edema 9. Atrial fibrillation. Chronic Coumadin discontinued secondary to intracerebral hemorrhage. Cardiac rate controlled. Continue Cardizem and Lopressor 10. Diabetes mellitus peripheral neuropathy. Hemoglobin A1c 5.9. Sliding scale insulin. Check blood sugars before meals and at bedtime. Patient on Glucophage 500 mg twice a day prior to admission.   -am glucophage may need to be initiated--observe for pattern 11. Chronic renal insufficiency. Baseline creatinine 1.35. Follow-up chemistries in am. Close to baseline may be a little dry 12. Hypothyroidism. TSH level 2.053. Continue Synthroid 13. History of gout. Zyloprim 100 mg daily. Not active currently 14. Hyperlipidemia. Resume statin at  discharge. 15.  Hypokalemia- cont KCL now corrected 16. Urinary incontinence: timed voids. ucx proteus S to Keflex LOS (Days) 8 A FACE TO FACE EVALUATION WAS PERFORMED  KIRSTEINS,ANDREW E 04/20/2015, 7:25 AM

## 2015-04-20 NOTE — Progress Notes (Signed)
Social Work Patient ID: Nicole [name], female   DOB: 1930/09/11, 81 y.o.   MRN: [id number] Met with pt and spoke with daughter-Gloria via telephone to discuss team conference goals-min/mod level and discharge target 10/11.  Discussed pt's need for 24 hr physical care and if this could be arranged  Via family.  Daughter to get with her brother's and come up with what they can do and the best option for the pt. Informed of the option for short term NHP.  Will await daughter to get back with this worker and Formulate a safe discharge plan for pt.  She would be much care at home due to incontinence issues.

## 2015-04-20 NOTE — Progress Notes (Signed)
Occupational Therapy Session Note  Patient Details  Name: Nicole [name] MRN: [id number] Date of Birth: [birth year]  Today's Date: 04/20/2015 OT Individual Time: 1100-1200 OT Individual Time Calculation (min): 60 min    Short Term Goals: Week 1:  OT Short Term Goal 1 (Week 1): Pt will be able to transfer to toilet with min A. OT Short Term Goal 2 (Week 1): Pt will be able to toilet with min A. OT Short Term Goal 3 (Week 1): Pt will be able to don shirt with min A. OT Short Term Goal 4 (Week 1): Pt will don pants with mod A. OT Short Term Goal 5 (Week 1): Pt will actively use LUE to wash R arm with min cues.  Skilled Therapeutic Interventions/Progress Updates:  Upon entering the room, pt seated in wheelchair with son present in room. Pt reporting sensitivity throughout session in L UE but unable to describe pain. Pt's son exiting room to give pt privacy with self care tasks. Pt very confused this session and requiring total A for orientation. Pt reporting that her L UE is injured from soft ball game. She is only oriented to self during this session. Pt agreeable to bathing and dressing from wheelchair at sink side. Pt requiring max verbal cues and increased time for initiation, sequencing, and attending to task this session. Pt required hand over hand assist in order to include L UE in functional tasks. Pt required min A for sit <>stand and standing balance with min guard during self care tasks. Pt seated in wheelchair with quick release belt donned and call bell within reach upon exiting the room.   Therapy Documentation Precautions:  Precautions Precautions: Fall Precaution Comments: L hemiplegia Restrictions Weight Bearing Restrictions: No  See Function Navigator for Current Functional Status.   Therapy/Group: Individual Therapy  Phineas Semen 04/20/2015, 12:50 PM

## 2015-04-20 NOTE — Progress Notes (Signed)
Physical Therapy Weekly Progress Note  Patient Details  Name: Nicole [name] MRN: [id number] Date of Birth: Jul 15, 1931  Beginning of progress report period: April 12, 2015 End of progress report period: April 20, 2015  Today's Date: 04/20/2015 PT Individual Time: 0830-0930 Treatment Session 2: 1300-1330 PT Individual Time Calculation (min): 60 min Treatment Session 2: 30 min  Patient has met 3 of 5 short term goals.  Pt has made slow, but steady improvements in activity tolerance, sequencing, and ambulation/transfers over this past week, though she continues to be severely limited - see below. 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   Patient continues to demonstrate the following deficits: low initiation of movement, L hemiplegia, impaired sit/stand balance, poor motor planning, pushing syndrome, low activity tolerance, difficulty with all aspects of functional mobility and therefore will continue to benefit from skilled PT intervention to enhance overall performance with activity tolerance, balance, postural control, ability to compensate for deficits, functional use of  left upper extremity and left lower extremity, attention, awareness and coordination.  Patient not progressing toward long term goals.  See goal revision..  Plan of care revisions: goals downgraded to min A - SBA overall.  PT Short Term Goals Week 1:  PT Short Term Goal 1 (Week 1): Pt will demonstrate side lie to sit req mod A consistently.  PT Short Term Goal 1 - Progress (Week 1): Met PT Short Term Goal 2 (Week 1): Pt will demonstrate sit to stand req min A.  PT Short Term Goal 2 - Progress (Week 1): Progressing toward goal PT Short Term Goal 3 (Week 1): Pt will demonstrate stand-step transfer req mod A.  PT Short Term Goal 3 - Progress (Week 1): Met PT Short Term Goal 4  (Week 1): Pt will ambulate 100' req mod A with RW PT Short Term Goal 4 - Progress (Week 1): Met PT Short Term Goal 5 (Week 1): Pt will demonstrate w/c propulsion x 50' req max A.  PT Short Term Goal 5 - Progress (Week 1): Not met Week 2:  PT Short Term Goal 1 (Week 2): Pt will demonstrate side lie to sit req min A.  PT Short Term Goal 2 (Week 2): Pt will demonstrate sit to stand req min A.  PT Short Term Goal 3 (Week 2): Pt will demonstrate sit to side lie req min A consistently.  PT Short Term Goal 4 (Week 2): Pt will consistently ambulate >= 150' with RW req min A.  PT Short Term Goal 5 (Week 2): Pt will ambulate up/down 3 steps with B rails req min A.   Skilled Therapeutic Interventions/Progress Updates:  Treatment Session 1:   Pt received in bed - agreeable to PT. Therapeutic Activity - see function tab for details re: bed mobility, transfers, and lower body dressing. Pt requests to dress eob and req repeated cues for dynamic sit balance during LB dressing activity, but SBA overall, able to weight shift forward with increased time and cues for retropulsion correction. Gait Training - see function tab for details. Pt demonstrates improved weight shift over R LE in order to self progress L LE with gait, as well as increased distance, but 165' takes ~30 minutes due to slow speed and motor inconsistency. Pt is slowly progressing with functional mobility, but req increased time and simple, one step commands for cues.  Treatment Session 2: Pt received on toilet - passed off from nurse tech and RN. Therapeutic Activity - see function tab for toileting details. Pt takes several attempts, leans L on toilet, but is able to complete anterior and posterior hygiene with steadying assist for dynamic sit balance. Pt washes hands in stand at bathroom sink and applies lipstick and blush in stand at sink req steadying assist. Pt combs her hair req min-mod A due to L arm flexion weakness (pt holds comb in L hand).  W/C Management - see function tab for details. Pt uses B UEs to self propel - pt pushes more strongly with R arm, but L arm participates intermittently. Pt req repeated verbal cues to push hard for this distance. Pt is continuing to slowly progress with functional mobility and will likely continue to improve with additional IPR PT. Continue per PT POC.   Therapy Documentation Precautions:  Precautions Precautions: Fall Precaution Comments: L hemiplegia Restrictions Weight Bearing Restrictions: No Pain: Pain Assessment Pain Assessment: No/denies pain Treatment Session 2: Pt c/o pain in L shoulder when positioning arm on wheel to push - grimacing - with activity.   See Function Navigator for Current Functional Status.  Therapy/Group: Individual Therapy  HYSLOP,AMANDA M 04/20/2015, 8:33 AM

## 2015-04-20 NOTE — Progress Notes (Signed)
Pt given magnesium citrate this am to attempt to relieve her of her constipation. Pt drank 3/4 of the bottle of magnesium citrate. Pt is still unsuccessful in having a bowel movement at this time.

## 2015-04-20 NOTE — Progress Notes (Signed)
Speech Language Pathology Weekly Progress and Session Note  Patient Details  Name: CORVETTE ORSER MRN: [id number] Date of Birth: 01/17/1931  Beginning of progress report period: April 12, 2015 End of progress report period: April 20, 2015  Today's Date: 04/20/2015 SLP Individual Time: 1000-1100 SLP Individual Time Calculation (min): 60 min  Short Term Goals: Week 1: SLP Short Term Goal 1 (Week 1): Patient will utilize external aids/cues for orientation to place, time and situation with Max A multimodal cues.  SLP Short Term Goal 1 - Progress (Week 1): Not met SLP Short Term Goal 2 (Week 1): Patient will demonstrate sustained attention to functional tasks for 2 minutes with Max A multimodal cues.  SLP Short Term Goal 2 - Progress (Week 1): Met SLP Short Term Goal 3 (Week 1): Patient will identify 1 physical and 1 cognitive deficit with Max A multimodal cues.  SLP Short Term Goal 3 - Progress (Week 1): Not met SLP Short Term Goal 4 (Week 1): Patient will initiate functional tasks in 50% of opportunities with Max A multimodal cues.  SLP Short Term Goal 4 - Progress (Week 1): Met SLP Short Term Goal 5 (Week 1): Patient will consume current diet with minimal overt s/s of aspiration with Max A multimodal cues for use of swallow strategies.  SLP Short Term Goal 5 - Progress (Week 1): Not met SLP Short Term Goal 6 (Week 1): Patient will demonstrate efficient mastication of Dys. 2 textures with minimal overt s/s of aspiration across 3 sessions to assess readiness for upgrade with Max A multimodal cues.  SLP Short Term Goal 6 - Progress (Week 1): Not met    New Short Term Goals: Week 2: SLP Short Term Goal 1 (Week 2): Patient will utilize external aids/cues for orientation to place, time and situation with Max A multimodal cues.  SLP Short Term Goal 2 (Week 2): Patient will demonstrate sustained attention to functional tasks for 5 minutes with Max A multimodal cues.  SLP Short Term Goal  3 (Week 2): Patient will identify 1 physical and 1 cognitive deficit with Max A multimodal cues.  SLP Short Term Goal 4 (Week 2): Patient will initiate functional tasks in 75% of opportunities with Max A multimodal cues.  SLP Short Term Goal 5 (Week 2): Patient will consume current diet with minimal overt s/s of aspiration with Max A multimodal cues for use of swallow strategies.   Weekly Progress Updates: Patient has made minimal and inconsistent gains and has met 2 of 6 STG's this reporting period. Currently, patient is consuming Dys. 1 textures with thin liquids with intermittent overt s/s of aspiration and requires Max-Total A for use of swallowing compensatory strategies.  Patient also requires overall Max-Total A for orientation, initiation, sustained attention, intellectual awareness, functional problem solving, recall and overall safety. Patient also demonstrates language of confusion. Patient and family education is ongoing. Patient would benefit from continued skilled SLP intervention to maximize swallowing and cognitive function and overall functional independence prior to discharge.    Intensity: Minumum of 1-2 x/day, 30 to 90 minutes Frequency: 3 to 5 out of 7 days Duration/Length of Stay: 2 weeks Treatment/Interventions: Cognitive remediation/compensation;Cueing hierarchy;Dysphagia/aspiration precaution training;Environmental controls;Functional tasks;Internal/external aids;Patient/family education;Therapeutic Activities   Daily Session  Skilled Therapeutic Interventions: Skilled treatment session focused on cognitive goals. Upon arrival, patient requested to use the bathroom and required Max A verbal cues for problem solving and sequencing with transfer. Patient required total A for orientation to place, time and situation and demonstrated  language of confusion throughout the session. Patient also required Max-Total A for problem solving and attention during a basic calendar making  task of placing numbers in correct order but initiated task with Min A verbal cues. Patient left upright in wheelchair with quick release belt in place. Continue with current plan of care.       Function:   Cognition Comprehension Comprehension assist level: Understands basic 25 - 49% of the time/ requires cueing 50 - 75% of the time  Expression   Expression assist level: Expresses basic 25 - 49% of the time/requires cueing 50 - 75% of the time. Uses single words/gestures.  Social Interaction Social Interaction assist level: Interacts appropriately 25 - 49% of time - Needs frequent redirection.  Problem Solving Problem solving assist level: Solves basic 25 - 49% of the time - needs direction more than half the time to initiate, plan or complete simple activities  Memory Memory assist level: Recognizes or recalls less than 25% of the time/requires cueing greater than 75% of the time   Pain Pain Assessment Pain Assessment: No/denies pain  Therapy/Group: Individual Therapy  PAYNE, COURTNEY 04/20/2015, 12:24 PM

## 2015-04-20 NOTE — Patient Care Conference (Signed)
Inpatient RehabilitationTeam Conference and Plan of Care Update Date: 04/20/2015   Time: 11;30 AM    Patient Name: Nicole [name]      Medical Record Number: [id number]  Date of Birth: 03/07/1931 Sex: Female         Room/Bed: 4M03C/4M03C-01 Payor Info: Payor: [employer] MEDICARE / Plan: [facility] MEDICARE / Product Type: *No Product type* /    Admitting Diagnosis: CVA  Admit Date/Time:  04/12/2015  6:15 PM Admission Comments: No comment available   Primary Diagnosis:  ICH (intracerebral hemorrhage) Principal Problem: ICH (intracerebral hemorrhage)  Patient Active Problem List   Diagnosis Date Noted  . Left hemiparesis   . Chronic diastolic congestive heart failure   . Persistent atrial fibrillation   . Type 2 diabetes mellitus with peripheral neuropathy   . ICH (intracerebral hemorrhage) 04/04/2015  . Anticoagulated on Coumadin   . Chronic atrial fibrillation   . Hypertensive urgency   . SOB (shortness of breath) 04/03/2014  . Epistaxis 02/15/2014  . SBO (small bowel obstruction) 01/23/2014  . Diabetic nephropathy 09/07/2013  . Chronic constipation 03/25/2013  . Carpal tunnel syndrome 02/25/2013  . Hypothyroidism   . Gout flare 06/01/2012  . Leakage of common bile duct s/p ERCP / stent 05/30/2012  . Ileus post op. still NPO 05/22/2012  . Bile leak, postoperative 05/17/2012  . Hypokalemia 05/17/2012  . Atrial fibrillation with RVR 05/16/2012  . Postoperative ileus 05/16/2012  . Small bowel obstruction due to adhesions 05/16/2012  . Acute on chronic renal insufficiency 05/15/2012  . Normal coronary arteries, 2008 05/15/2012  . Type II or unspecified type diabetes mellitus with neurological manifestations, not stated as uncontrolled 05/15/2012  . HTN (hypertension) 05/15/2012  . Respiratory distress post op secondary to rapid AF 05/15/2012  . ARF (acute renal failure) 05/15/2012  . Abdominal pain, acute, right upper quadrant 05/12/2012  . Nausea and vomiting 05/12/2012   . Dehydration 05/12/2012  . Cholecystitis chronic, acute, S/P lap cholecystectomy 05/14/12 05/12/2012  . Thoracic aortic aneurysm, 4.8cm 2011   . CHF, past NICM with an EF of 30%, most recent echo 2011 showed EF to be45- 50%   . Anemia 08/09/2011  . History of colon cancer, stage III 08/09/2011    Expected Discharge Date: Expected Discharge Date: 05/03/15  Team Members Present: Physician leading conference: Dr. Thelma Comp Patel;Dr. Alysia Penna Social Worker Present: Ovidio Kin, LCSW Nurse Present: Heather Roberts, RN PT Present: Guilford Shi, PT OT Present: Willeen Cass, OT SLP Present: Gunnar Fusi, SLP PPS Coordinator present : Daiva Nakayama, RN, CRRN     Current Status/Progress Goal Weekly Team Focus  Medical   Making progress but slow with mobility, cognition is poor, no safety issues thus far  cont bowel  increase endurance   Bowel/Bladder   Incontinent of bowel and bladder. LBM 04/16/15  Managed bowel program  Encourage time toileting   Swallow/Nutrition/ Hydration   Dys 1, thin liquids; full supervision for use of swallowing precautions due to persistent confusion and cognitive deficits   supervision with least restrictive diet   trials of advanced consistencies per improved mentation    ADL's   max to total A from limited LUE functional use, severe cognitive deficits, decreased balance  Supervision overall (may need to be downgraded to mod A)  family education, ADL retraining, functional mobility, gentle LUE ROM and NMR   Mobility   Mod A overall  Supervision overall; except min A for car transfer - goals downgraded to min A overall.   Initiation,attention,  weight shifting, balance, transfers, gait   Communication   communication is impacted by cognition, verbal expression is frequently characterized by language of confusion          Safety/Cognition/ Behavioral Observations  Max to total assist for basic tasks  min-mod   basic cognition, orientation, initiation,  focused/sustained attention to tasks   Pain   L shoulder discomfort, requiring Tylenol 650mg  q 4hrs  <4  Offer pain medication 1hr prior to therapy session, assess for nonverbal cues of pain   Skin   CDI  CDI  Assess q shift, assist with turn q 2hrs      *See Care Plan and progress notes for long and short-term goals.  Barriers to Discharge: heavy physical assist    Possible Resolutions to Barriers:  Assess whether family can provide min/Mod A assist with diaper changes    Discharge Planning/Teaching Needs:  Home with son and boyfriend assisting-pt very independent prior to admission-both have been here to observe in therapies      Team Discussion:  Goals-min/mod-will need to downgrade goals form supervision.  Cognition limiting her participation in therapies. UTI being treated. L-shoulder pain treating with positioning and medication. Dys 1 thin liquid diet. Need to see if family can provide this much physical care.  Revisions to Treatment Plan:  Downgrading goals-min/mod   Continued Need for Acute Rehabilitation Level of Care: The patient requires daily medical management by a physician with specialized training in physical medicine and rehabilitation for the following conditions: Daily direction of a multidisciplinary physical rehabilitation program to ensure safe treatment while eliciting the highest outcome that is of practical value to the patient.: Yes Daily medical management of patient stability for increased activity during participation in an intensive rehabilitation regime.: Yes Daily analysis of laboratory values and/or radiology reports with any subsequent need for medication adjustment of medical intervention for : Neurological problems;Other  [place], Gardiner Rhyme 04/20/2015, 1:50 PM

## 2015-04-21 ENCOUNTER — Inpatient Hospital Stay (HOSPITAL_COMMUNITY): Payer: Medicare Other | Admitting: Physical Therapy

## 2015-04-21 ENCOUNTER — Inpatient Hospital Stay (HOSPITAL_COMMUNITY): Payer: Medicare Other

## 2015-04-21 ENCOUNTER — Inpatient Hospital Stay (HOSPITAL_COMMUNITY): Payer: Medicare Other | Admitting: Speech Pathology

## 2015-04-21 LAB — GLUCOSE, CAPILLARY
Glucose-Capillary: 166 mg/dL — ABNORMAL HIGH (ref 65–99)
Glucose-Capillary: 267 mg/dL — ABNORMAL HIGH (ref 65–99)
Glucose-Capillary: 53 mg/dL — ABNORMAL LOW (ref 65–99)
Glucose-Capillary: 75 mg/dL (ref 65–99)
Glucose-Capillary: 171 mg/dL — ABNORMAL HIGH (ref 65–99)

## 2015-04-21 MED ORDER — METFORMIN HCL 500 MG PO TABS
250.0000 mg | ORAL_TABLET | Freq: Two times a day (BID) | ORAL | Status: DC
  Administered 2015-04-21: 250 mg via ORAL
  Filled 2015-04-21: qty 1

## 2015-04-21 MED ORDER — GLUCERNA SHAKE PO LIQD
237.0000 mL | Freq: Two times a day (BID) | ORAL | Status: DC
  Administered 2015-04-21 – 2015-05-02 (×17): 237 mL via ORAL

## 2015-04-21 MED ORDER — MUSCLE RUB 10-15 % EX CREA
TOPICAL_CREAM | CUTANEOUS | Status: DC | PRN
  Filled 2015-04-21: qty 85

## 2015-04-21 NOTE — Progress Notes (Signed)
Occupational Therapy Weekly Progress Note  Patient Details  Name: Nicole [name] MRN: [id number] Date of Birth: January 06, 1931  Beginning of progress report period: April 14, 2015 End of progress report period: April 21, 2015  Today's Date: 04/21/2015 OT Individual Time: 0998-3382 OT Individual Time Calculation (min): 60 min    Patient has met 2 of 5 short term goals.  LTG revised to mod assist for lower body dressing, min assist for upper body, min assist for bathroom transfers, and max assist for toileting d/t cognitive impairment (confusion as evidenced by frequent intermittent declines in orientation, short term memory, awareness, and attention).  Patient continues to demonstrate the following deficits: cognitive impairment, left shoulder pain, decreased [place] of dominant hand (left), impaired dynamic standing balance, and therefore will continue to benefit from skilled OT intervention to enhance overall performance with BADL.  Patient not progressing toward long term goals.  See goal revision..  Plan of care revisions: Overall Mod-Min Assist with BADL.  OT Short Term Goals Week 1:  OT Short Term Goal 1 (Week 1): Pt will be able to transfer to toilet with min A. OT Short Term Goal 1 - Progress (Week 1): Progressing toward goal OT Short Term Goal 2 (Week 1): Pt will be able to toilet with min A. OT Short Term Goal 2 - Progress (Week 1): Progressing toward goal OT Short Term Goal 3 (Week 1): Pt will be able to don shirt with min A. OT Short Term Goal 3 - Progress (Week 1): Progressing toward goal OT Short Term Goal 4 (Week 1): Pt will don pants with mod A. OT Short Term Goal 4 - Progress (Week 1): Met OT Short Term Goal 5 (Week 1): Pt will actively use LUE to wash R arm with min cues. OT Short Term Goal 5 - Progress (Week 1): Met Week 2:  OT Short Term Goal 1 (Week 2): Pt will demo ability to lace left pant leg using AE with min assist OT Short Term Goal 2 (Week 2): Pt will demo  ability to lace left shirt sleeve with vc to dress left first OT Short Term Goal 3 (Week 2): Pt will complete toilet hygiene standing supported with min assist for thoroughness OT Short Term Goal 4 (Week 2): Pt will groom (wash face, hands, brush teeth) standing at sink with sublte cues and setup assist.  Skilled Therapeutic Interventions/Progress Updates: ADL-retraining with focus on memory, attention, sequencing, functional transfers, functional mobility, and dynamic standing balance.   Pt received supine in bed requiring mod vc to orient to place and situation.   Pt able to rise to EOB with overall mod assist (lift trunk and 1 leg) and setup to adjust bed.   Pt rose to stand at [facility] with mod assist (Pt=70%) and ambulated to sink (waddle, penguin-like) with only min guard assist and extra time.   Pt remained standing to bathe at sink, standing for 20 minutes before resting in w/c at sink.   Pt required mod vc throughout session to initiate and redirect due to perseveration with brushing her teeth and applying lipstick.   Pt's conversation required redirection and clarification d/t her continued confusion with names and events, often reporting her mother's presence in her current life as if she were still alive.   Pt demo'd excellent use of LUE during bathing/dressing without need for cues however [place] and grip/pinch strength limit ability to perform tasks efficiently.   Pt left in w/c at end of session with RN tech providing  setup for self-feeding.      Therapy Documentation Precautions:  Precautions Precautions: Fall Precaution Comments: L hemiplegia Restrictions Weight Bearing Restrictions: No  Vital Signs: Therapy Vitals Pulse Rate: 91 BP: (!) 151/84 mmHg   Pain: Pain Assessment Faces Pain Scale: Hurts little more Pain Type: Neuropathic pain Pain Location: Shoulder Pain Orientation: Left;Proximal Pain Frequency: Intermittent Pain Onset: With Activity Patients Stated Pain Goal: 3  See  Function Navigator for Current Functional Status.   Therapy/Group: Individual Therapy   Second session: Time: 1130-1200 Time Calculation (min):  30 min  Pain Assessment: No/denies pain  Skilled Therapeutic Interventions: Therapeutic activity with focus on dynamic standing balance, safety awareness, family ed (son and friend [name]), problem-solving, and sustained attention.   Pt received seated in w/c with son [name]) and friend [place] attending.   OT presented challenge of kitchen task to pt and family as intended to demonstrate pt's ability to rise from sit>stand, ambulate with RW, recall instructions, problem-solve (awareness) and sustain standing balance.   Pt retrieved pudding mix and prepared 2 cups of butterscotch pudding with standby assist to problem-solve, mod vc to sustain attention to task, and setup to provide all materials.    Pt completed task, standing for 12 minutes at countertop using mixing bowl and electric mixer with min assist to reduce spillage.   Pt attempts to use LUE during task and is able to switch to right hand for improved stability when cued.   Son observed as did friend that pt requires frequent cues to reorient and attend to task and to problem-solve however pt has improved her awareness this session and initiated request for help, 3 times during task.   Pt returned to room in w/c with QRB attached and family still present.   See FIM for current functional status  Therapy/Group: Individual Therapy  [place] 04/21/2015, 12:53 PM

## 2015-04-21 NOTE — Progress Notes (Signed)
Physical Therapy Session Note  Patient Details  Name: Nicole [name] MRN: [id number] Date of Birth: 05-10-1931  Today's Date: 04/21/2015 PT Individual Time: 1330-1430 PT Individual Time Calculation (min): 60 min   Short Term Goals: Week 2:  PT Short Term Goal 1 (Week 2): Pt will demonstrate side lie to sit req min A.  PT Short Term Goal 2 (Week 2): Pt will demonstrate sit to stand req min A.  PT Short Term Goal 3 (Week 2): Pt will demonstrate sit to side lie req min A consistently.  PT Short Term Goal 4 (Week 2): Pt will consistently ambulate >= 150' with RW req min A.  PT Short Term Goal 5 (Week 2): Pt will ambulate up/down 3 steps with B rails req min A.   Skilled Therapeutic Interventions/Progress Updates:    Pt received up in w/c - pleasantly confused and agreeable to PT. Pt's son present entire session, other son enters late into session, daughter present first part of session, and SO present halfway through session, observing and offering encouragement. Therapeutic Activity - see function tab for bed mobility, transfer, and toileting details. Pt continues to take increased time, but is speeding up. Pt washes hands at sink with verbal cues and SBA afterward. Gait Training - See function tab for ambulation details - pt's progression of L LE is improving and PT cues pt to "walk as fast as possible". Stair training - PT instructs pt in repeated step-ups on 6" step with B rails x 3 reps each leg leading req CGA-min A for safety. Pt ended in bed with family present, comfortable, with all needs in reach. Continue per PT POC.   Therapy Documentation Precautions:  Precautions Precautions: Fall Precaution Comments: L hemiplegia Restrictions Weight Bearing Restrictions: No Pain: Pain Assessment Pain Assessment: Faces Faces Pain Scale: Hurts little more Pain Type: Neuropathic pain Pain Location: Shoulder Pain Orientation: Left;Proximal Pain Descriptors / Indicators: Grimacing Pain Onset:  With Activity Pain Intervention(s): Repositioned;Rest   See Function Navigator for Current Functional Status.   Therapy/Group: Individual Therapy  HYSLOP,AMANDA M 04/21/2015, 4:19 PM

## 2015-04-21 NOTE — Progress Notes (Signed)
Speech Language Pathology Daily Session Note  Patient Details  Name: Nicole [name] MRN: [id number] Date of Birth: 06-30-1931  Today's Date: 04/21/2015 SLP Individual Time: 1003-1105 SLP Individual Time Calculation (min): 62 min  Short Term Goals: Week 2: SLP Short Term Goal 1 (Week 2): Patient will utilize external aids/cues for orientation to place, time and situation with Max A multimodal cues.  SLP Short Term Goal 2 (Week 2): Patient will demonstrate sustained attention to functional tasks for 5 minutes with Max A multimodal cues.  SLP Short Term Goal 3 (Week 2): Patient will identify 1 physical and 1 cognitive deficit with Max A multimodal cues.  SLP Short Term Goal 4 (Week 2): Patient will initiate functional tasks in 75% of opportunities with Max A multimodal cues.  SLP Short Term Goal 5 (Week 2): Patient will consume current diet with minimal overt s/s of aspiration with Max A multimodal cues for use of swallow strategies.   Skilled Therapeutic Interventions:  Pt was seen for skilled ST targeting dysphagia goals and ongoing family education.  Upon arrival, pt was seated upright in wheelchair, awake, lethargic, confused, but agreeable to participate in [place].  Pt required max assist verbal and visual cues for use of external aid to reorient to place and situation.  Pt's significant other, Nicole [name], was present throughout therapy session.  As a result, SLP provided skilled education regarding rationale behind pt's currently prescribed diet and swallowing precautions, emphasizing how pt's confusion and cognitive deficits (particularly attention) impact her ability to safely consume PO.  SLP demonstrated cuing techniques to maximize swallow safety during a functional snack of purees and thin liquids.  Nicole [name] then returned demonstration of cuing techniques, including hand over hand assist to facilitate initiation of self feeding.  No overt s/s of aspiration were evident with purees or thin liquids.   SLP will sign off on [place] supervising pt with cup sips of thin liquids only due to pt's rapid fluctuations in mentation and alertness.  Will address further training and education tomorrow during a functional meal.  Pt was left upright in wheelchair with quick release belt donned and family members present at bedside.  Continue per current plan of care.    Function:  Eating Eating   Modified Consistency Diet: Yes Eating Assist Level: Helper scoops food on utensil;Helper brings food to mouth;Helper checks for pocketed food;Supervision or verbal cues;Help with picking up utensils;Help managing cup/glass;Set up assist for   Eating Set Up Assist For: Opening containers Helper Scoops Food on Utensil: Occasionally Helper Brings Food to Mouth: Occasionally   Cognition Comprehension Comprehension assist level: Understands basic 25 - 49% of the time/ requires cueing 50 - 75% of the time  Expression   Expression assist level: Expresses basic 50 - 74% of the time/requires cueing 25 - 49% of the time. Needs to repeat parts of sentences.  Social Interaction Social Interaction assist level: Interacts appropriately 25 - 49% of time - Needs frequent redirection.  Problem Solving Problem solving assist level: Solves basic 25 - 49% of the time - needs direction more than half the time to initiate, plan or complete simple activities  Memory Memory assist level: Recognizes or recalls less than 25% of the time/requires cueing greater than 75% of the time    Pain Pain Assessment Pain Assessment: No/denies pain   Therapy/Group: Individual Therapy  Domanick Cuccia, Selinda Orion 04/21/2015, 11:57 AM

## 2015-04-21 NOTE — Progress Notes (Signed)
Hypoglycemic Event  CBG: 53  Treatment: 15 GM carbohydrate snack  Symptoms: None  Follow-up CBG: Time:1705 CBG Result:75  Possible Reasons for Event: Unknown  Comments/MD notified:D. [place], [place]    Nicole [name]  Remember to initiate Hypoglycemia Order Set & complete

## 2015-04-21 NOTE — Progress Notes (Signed)
Patient ID: Nicole [name], female   DOB: 1931/01/31, 81 y.o.   MRN: [id number]  Ongoing left shoulder pain, particularly with attempts to range arm with OT this morning. Subjective/Complaints: No abd pain   ROS- neg bowel, bladder breathing or pain issues, limited due to cognition Objective: Vital Signs: Blood pressure 151/84, pulse 91, temperature 97.8 F (36.6 C), temperature source Oral, resp. rate 17, height $RemoveBe'5\' 9"'[id number]$  (1.753 m), weight 76 kg (167 lb 8.8 oz), SpO2 100 %. No results found. Results for orders placed or performed during the hospital encounter of 04/12/15 (from the past 72 hour(s))  Basic metabolic panel     Status: Abnormal   Collection Time: 04/18/15 10:36 AM  Result Value Ref Range   Sodium 141 135 - 145 mmol/L   Potassium 4.2 3.5 - 5.1 mmol/L   Chloride 108 101 - 111 mmol/L   CO2 24 22 - 32 mmol/L   Glucose, Bld 306 (H) 65 - 99 mg/dL   BUN 27 (H) 6 - 20 mg/dL   Creatinine, Ser 1.40 (H) 0.44 - 1.00 mg/dL   Calcium 9.6 8.9 - 10.3 mg/dL   GFR calc non Af Amer 34 (L) >60 mL/min   GFR calc Af Amer 39 (L) >60 mL/min    Comment: (NOTE) The eGFR has been calculated using the CKD EPI equation. This calculation has not been validated in all clinical situations. eGFR's persistently <60 mL/min signify possible Chronic Kidney Disease.    Anion gap 9 5 - 15  Glucose, capillary     Status: Abnormal   Collection Time: 04/18/15 12:08 PM  Result Value Ref Range   Glucose-Capillary 252 (H) 65 - 99 mg/dL   Comment 1 Document in Chart   Glucose, capillary     Status: Abnormal   Collection Time: 04/18/15  4:28 PM  Result Value Ref Range   Glucose-Capillary 128 (H) 65 - 99 mg/dL   Comment 1 Notify RN   Glucose, capillary     Status: Abnormal   Collection Time: 04/18/15  8:24 PM  Result Value Ref Range   Glucose-Capillary 161 (H) 65 - 99 mg/dL  Glucose, capillary     Status: Abnormal   Collection Time: 04/19/15  6:48 AM  Result Value Ref Range   Glucose-Capillary 155 (H)  65 - 99 mg/dL  Glucose, capillary     Status: Abnormal   Collection Time: 04/19/15 11:12 AM  Result Value Ref Range   Glucose-Capillary 208 (H) 65 - 99 mg/dL  Glucose, capillary     Status: Abnormal   Collection Time: 04/19/15  4:34 PM  Result Value Ref Range   Glucose-Capillary 203 (H) 65 - 99 mg/dL  Glucose, capillary     Status: Abnormal   Collection Time: 04/19/15  9:19 PM  Result Value Ref Range   Glucose-Capillary 241 (H) 65 - 99 mg/dL  Glucose, capillary     Status: Abnormal   Collection Time: 04/20/15  6:27 AM  Result Value Ref Range   Glucose-Capillary 190 (H) 65 - 99 mg/dL  Glucose, capillary     Status: Abnormal   Collection Time: 04/20/15 11:59 AM  Result Value Ref Range   Glucose-Capillary 213 (H) 65 - 99 mg/dL  Glucose, capillary     Status: Abnormal   Collection Time: 04/20/15  4:46 PM  Result Value Ref Range   Glucose-Capillary 161 (H) 65 - 99 mg/dL  Glucose, capillary     Status: Abnormal   Collection Time: 04/20/15  8:49 PM  Result  Value Ref Range   Glucose-Capillary 182 (H) 65 - 99 mg/dL  Glucose, capillary     Status: Abnormal   Collection Time: 04/21/15  6:54 AM  Result Value Ref Range   Glucose-Capillary 166 (H) 65 - 99 mg/dL     HEENT: normal Cardio: IRRR and diastolic murmur Resp: CTA B/L and unlabored GI: BS positive and non distended Extremity:  No Edema Skin:   Intact Neuro: pleasantly Confused, Flat, Cranial Nerve II-XII normal, Normal Sensory, Abnormal Motor 3- Left delt bi tri 2-grip 3- L HF, 4- KE , 3- ankle DF, 5/5 on Right side and Abnormal [facility] Ataxic/ dec [facility] Musc/Skel:  Left shoulder pain with all movement incl ext rotation. ROM very restricted Gen NAD   Assessment/Plan: 1. Functional deficits secondary to right caudate intercerebral hemorrhage secondary to hypertensive crisis with left hemiparesis and cognitive deficits which require 3+ hours per day of interdisciplinary therapy in a comprehensive inpatient rehab setting. Physiatrist  is providing close team supervision and 24 hour management of active medical problems listed below. Physiatrist and rehab team continue to assess barriers to discharge/monitor patient progress toward functional and medical goals.  Team conference today please see physician documentation under team conference tab, met with team face-to-face to discuss problems,progress, and goals. Formulized individual treatment plan based on medical history, underlying problem and comorbidities. FIM: Function - Bathing Position: Wheelchair/chair at sink Body parts bathed by patient: Right arm, Left arm, Chest, Abdomen, Front perineal area, Right upper leg, Left upper leg, Buttocks Body parts bathed by helper: Right lower leg, Left lower leg, Back Bathing not applicable: Front perineal area, Buttocks (pt has been cleansed prior to session by nursing) Assist Level: Touching or steadying assistance(Pt > 75%)  Function- Upper Body Dressing/Undressing What is the patient wearing?: Pull over shirt/dress Bra - Perfomed by patient: Thread/unthread right bra strap Bra - Perfomed by helper: Thread/unthread left bra strap, Hook/unhook bra (pull down sports bra), Thread/unthread right bra strap Pull over shirt/dress - Perfomed by patient: Thread/unthread right sleeve Pull over shirt/dress - Perfomed by helper: Thread/unthread left sleeve, Put head through opening, Pull shirt over trunk Button up shirt - Perfomed by patient: Thread/unthread right sleeve Button up shirt - Perfomed by helper: Thread/unthread left sleeve, Pull shirt around back, Button/unbutton shirt Assist Level: Touching or steadying assistance(Pt > 75%) Function - Lower Body Dressing/Undressing What is the patient wearing?: Shoes, Pants, Underwear Position: Wheelchair/chair at sink Underwear - Performed by helper: Thread/unthread right underwear leg, Thread/unthread left underwear leg, Pull underwear up/down Pants- Performed by helper: Thread/unthread  right pants leg, Thread/unthread left pants leg, Pull pants up/down Non-skid slipper socks- Performed by helper: Don/doff right sock, Don/doff left sock Shoes - Performed by helper: Don/doff right shoe, Don/doff left shoe, Fasten right, Fasten left TED Hose - Performed by helper: Don/doff right TED hose, Don/doff left TED hose Assist Level: Touching or steadying assistance (Pt > 75%)  Function - Toileting Toileting activity did not occur: No continent bowel/bladder event Toileting steps completed by patient: Performs perineal hygiene, Adjust clothing prior to toileting Toileting steps completed by helper: Adjust clothing after toileting Toileting Assistive Devices: Grab bar or rail Assist level: Touching or steadying assistance (Pt.75%)  Function - Air cabin crew transfer activity did not occur: Safety/medical concerns Toilet transfer assistive device: Grab bar, Elevated toilet seat/BSC over toilet Assist level to toilet: Moderate assist (Pt 50 - 74%/lift or lower) Assist level from toilet: Moderate assist (Pt 50 - 74%/lift or lower) Assist level to bedside commode (at  bedside): Maximal assist (Pt 25 - 49%/lift and lower) Assist level from bedside commode (at bedside): Maximal assist (Pt 25 - 49%/lift and lower)  Function - Chair/bed transfer Chair/bed transfer method: Stand pivot Chair/bed transfer assist level: Moderate assist (Pt 50 - 74%/lift or lower) Chair/bed transfer assistive device: Armrests Chair/bed transfer details: Manual facilitation for placement, Manual facilitation for weight shifting, Verbal cues for sequencing, Verbal cues for technique  Function - Locomotion: Wheelchair Will patient use wheelchair at discharge?: Yes Type: Manual Max wheelchair distance: 58' Assist Level: Maximal assistance (Pt 25 - 49%) Wheel 50 feet with 2 turns activity did not occur: Safety/medical concerns Assist Level: Dependent (Pt equals 0%) Wheel 150 feet activity did not occur:  Safety/medical concerns Assist Level: Dependent (Pt equals 0%) Turns around,maneuvers to table,bed, and toilet,negotiates 3% grade,maneuvers on rugs and over doorsills: No Function - Locomotion: Ambulation Assistive device: Walker-rolling Max distance: 165' Assist level: Touching or steadying assistance (Pt > 75%) Assist level: Touching or steadying assistance (Pt > 75%) Assist level: Touching or steadying assistance (Pt > 75%) Walk 150 feet activity did not occur: Safety/medical concerns Assist level: Touching or steadying assistance (Pt > 75%) Assist level: Maximal assist (Pt 25 - 49%)  Function - Comprehension Comprehension: Auditory Comprehension assist level: Understands basic 25 - 49% of the time/ requires cueing 50 - 75% of the time  Function - Expression Expression: Verbal Expression assist level: Expresses basic 25 - 49% of the time/requires cueing 50 - 75% of the time. Uses single words/gestures.  Function - Social Interaction Social Interaction assist level: Interacts appropriately 25 - 49% of time - Needs frequent redirection.  Function - Problem Solving Problem solving assist level: Solves basic 25 - 49% of the time - needs direction more than half the time to initiate, plan or complete simple activities  Function - Memory Memory assist level: Recognizes or recalls less than 25% of the time/requires cueing greater than 75% of the time Patient normally able to recall (first 3 days only): None of the above  Medical Problem List and Plan: 1. Functional deficits secondary to right caudate intercerebral hemorrhage secondary to hypertensive crisis 2. DVT Prophylaxis/Anticoagulation: Subcutaneous heparin added for DVT prophylaxis 04/08/2015, change to lovenox for improved efficacy and reduced needle sticks 3. Pain Management: Tylenol as needed, post stroke shoulder pain, limited ROM, developing spasticity, start with local measures, aggressive PT/OT, tramadol for pain, Left  AC and GH OA noted on Xray, position Left arm in external rotation  -heat, sports cream. ?inject 4. Hypertension. Clonidine 0.2 mg 3 times a day, Cardizem 240 mg daily, lisinopril 20 mg twice a day, Lopressor 25 mg twice a day. Will allow a degree of "permissive hypertension" to maintain perfusion  5. Neuropsych: This patient is capable of making decisions on her own behalf. 6. Skin/Wound Care: Routine skin checks 7. Fluids/Electrolytes/Nutrition: Routine I&O with follow-up chemistries, meal intake 10-50%, Intake ~380m fluid, IVF at noc 8. Systolic congestive heart failure. Check daily weights and I's and O's. Patient on Lasix 40 mg daily prior to admission. Resume as needed currently fluid intake very low , minimal pedal edema 9. Atrial fibrillation. Chronic Coumadin discontinued secondary to intracerebral hemorrhage. Cardiac rate controlled. Continue Cardizem and Lopressor 10. Diabetes mellitus peripheral neuropathy. Hemoglobin A1c 5.9. Sliding scale insulin. Check blood sugars before meals and at bedtime. Patient on Glucophage 500 mg twice a day prior to admission.   -initiate glucophage 2579mbid 11. Chronic renal insufficiency. Baseline creatinine 1.35. 1.4 most recently---push po fluids. 12.  Hypothyroidism. TSH level 2.053. Continue Synthroid 13. History of gout. Zyloprim 100 mg daily. Not active currently  14. Hyperlipidemia. Resume statin at discharge. 15.  Hypokalemia- cont KCL now corrected. Recheck next week 16. Urinary incontinence: timed voids. ucx proteus S to Keflex  LOS (Days) 9 A FACE TO FACE EVALUATION WAS PERFORMED  SWARTZ,ZACHARY T 04/21/2015, 9:21 AM

## 2015-04-22 ENCOUNTER — Inpatient Hospital Stay (HOSPITAL_COMMUNITY): Payer: Medicare Other | Admitting: Speech Pathology

## 2015-04-22 ENCOUNTER — Inpatient Hospital Stay (HOSPITAL_COMMUNITY): Payer: Medicare Other | Admitting: Occupational Therapy

## 2015-04-22 ENCOUNTER — Inpatient Hospital Stay (HOSPITAL_COMMUNITY): Payer: Medicare Other | Admitting: Physical Therapy

## 2015-04-22 LAB — GLUCOSE, CAPILLARY
GLUCOSE-CAPILLARY: 144 mg/dL — AB (ref 65–99)
GLUCOSE-CAPILLARY: 183 mg/dL — AB (ref 65–99)
GLUCOSE-CAPILLARY: 180 mg/dL — AB (ref 65–99)
Glucose-Capillary: 194 mg/dL — ABNORMAL HIGH (ref 65–99)

## 2015-04-22 MED ORDER — METOPROLOL TARTRATE 50 MG PO TABS
50.0000 mg | ORAL_TABLET | Freq: Two times a day (BID) | ORAL | Status: DC
  Administered 2015-04-22 – 2015-05-02 (×20): 50 mg via ORAL
  Filled 2015-04-22 (×21): qty 1

## 2015-04-22 NOTE — Progress Notes (Signed)
Speech Language Pathology Daily Session Note  Patient Details  Name: Nicole [name] MRN: [id number] Date of Birth: 12/09/1930  Today's Date: 04/22/2015 SLP Individual Time: 1303-1400 SLP Individual Time Calculation (min): 57 min  Short Term Goals: Week 2: SLP Short Term Goal 1 (Week 2): Patient will utilize external aids/cues for orientation to place, time and situation with Max A multimodal cues.  SLP Short Term Goal 2 (Week 2): Patient will demonstrate sustained attention to functional tasks for 5 minutes with Max A multimodal cues.  SLP Short Term Goal 3 (Week 2): Patient will identify 1 physical and 1 cognitive deficit with Max A multimodal cues.  SLP Short Term Goal 4 (Week 2): Patient will initiate functional tasks in 75% of opportunities with Max A multimodal cues.  SLP Short Term Goal 5 (Week 2): Patient will consume current diet with minimal overt s/s of aspiration with Max A multimodal cues for use of swallow strategies.   Skilled Therapeutic Interventions: Skilled treatment session focused on addressing cognition and dysphagia goals as well as family education. SLP facilitated session by providing Min physical assist and Max multimodal cues to attend to 1 task at a time during tray set-up.  Patient required Mod-Max assist mulitmodal cues for sustained attention to self-feeding and initiation of swallow between each bite.  Significant other and son Johny Sax) present and required significant cues upon initiation of meal and by end of session they only required intermittent cuing. RN notified of update to patient safety plan.  Patient consumed meal with no overt s/s of aspiration.     Function:  Eating Eating   Modified Consistency Diet: Yes Eating Assist Level: Helper scoops food on utensil;Helper checks for pocketed food;Supervision or verbal cues;Help with picking up utensils;Help managing cup/glass;Set up assist for   Eating Set Up Assist For: Opening containers Helper Scoops  Food on Utensil: Occasionally     Cognition Comprehension Comprehension assist level: Understands basic 50 - 74% of the time/ requires cueing 25 - 49% of the time  Expression   Expression assist level: Expresses basic 50 - 74% of the time/requires cueing 25 - 49% of the time. Needs to repeat parts of sentences.  Social Interaction Social Interaction assist level: Interacts appropriately 75 - 89% of the time - Needs redirection for appropriate language or to initiate interaction.  Problem Solving Problem solving assist level: Solves basic 25 - 49% of the time - needs direction more than half the time to initiate, plan or complete simple activities  Memory Memory assist level: Recognizes or recalls less than 25% of the time/requires cueing greater than 75% of the time    Pain Pain Assessment Pain Assessment: No/denies pain  Therapy/Group: Individual Therapy  Carmelia Roller., [place] [phone]  [place] 04/22/2015, 3:17 PM

## 2015-04-22 NOTE — Progress Notes (Signed)
Patient ID: Nicole [name], female   DOB: 11-12-1930, 81 y.o.   MRN: [id number]   Subjective/Complaints: Shoulder pain better Formed BM yesterday  ROS- neg bowel, bladder breathing or pain issues, limited due to cognition Objective: Vital Signs: Blood pressure 168/81, pulse 82, temperature 98.8 F (37.1 C), temperature source Oral, resp. rate 18, height _0  (1.753 m), weight 76 kg (167 lb 8.8 oz), SpO2 100 %. No results found. Results for orders placed or performed during the hospital encounter of 04/12/15 (from the past 72 hour(s))  Glucose, capillary     Status: Abnormal   Collection Time: 04/19/15 11:12 AM  Result Value Ref Range   Glucose-Capillary 208 (H) 65 - 99 mg/dL  Glucose, capillary     Status: Abnormal   Collection Time: 04/19/15  4:34 PM  Result Value Ref Range   Glucose-Capillary 203 (H) 65 - 99 mg/dL  Glucose, capillary     Status: Abnormal   Collection Time: 04/19/15  9:19 PM  Result Value Ref Range   Glucose-Capillary 241 (H) 65 - 99 mg/dL  Glucose, capillary     Status: Abnormal   Collection Time: 04/20/15  6:27 AM  Result Value Ref Range   Glucose-Capillary 190 (H) 65 - 99 mg/dL  Glucose, capillary     Status: Abnormal   Collection Time: 04/20/15 11:59 AM  Result Value Ref Range   Glucose-Capillary 213 (H) 65 - 99 mg/dL  Glucose, capillary     Status: Abnormal   Collection Time: 04/20/15  4:46 PM  Result Value Ref Range   Glucose-Capillary 161 (H) 65 - 99 mg/dL  Glucose, capillary     Status: Abnormal   Collection Time: 04/20/15  8:49 PM  Result Value Ref Range   Glucose-Capillary 182 (H) 65 - 99 mg/dL  Glucose, capillary     Status: Abnormal   Collection Time: 04/21/15  6:54 AM  Result Value Ref Range   Glucose-Capillary 166 (H) 65 - 99 mg/dL  Glucose, capillary     Status: Abnormal   Collection Time: 04/21/15 11:29 AM  Result Value Ref Range   Glucose-Capillary 267 (H) 65 - 99 mg/dL  Glucose, capillary     Status: Abnormal   Collection Time:  04/21/15  4:32 PM  Result Value Ref Range   Glucose-Capillary 53 (L) 65 - 99 mg/dL  Glucose, capillary     Status: None   Collection Time: 04/21/15  4:49 PM  Result Value Ref Range   Glucose-Capillary 75 65 - 99 mg/dL  Glucose, capillary     Status: Abnormal   Collection Time: 04/21/15  9:08 PM  Result Value Ref Range   Glucose-Capillary 171 (H) 65 - 99 mg/dL   Comment 1 Notify RN      HEENT: normal Cardio: IRRR and diastolic murmur Resp: CTA B/L and unlabored GI: BS positive and non distended Extremity:  No Edema Skin:   Intact Neuro: pleasantly Confused, Flat, Cranial Nerve II-XII normal, Normal Sensory, Abnormal Motor 3- Left delt bi tri 2-grip 3- L HF, 4- KE , 3- ankle DF, 5/5 on Right side and Abnormal [facility] Ataxic/ dec [facility] Musc/Skel:  Left shoulder pain with all movement incl ext rotation. ROM very restricted Gen NAD   Assessment/Plan: 1. Functional deficits secondary to right caudate intercerebral hemorrhage secondary to hypertensive crisis with left hemiparesis and cognitive deficits which require 3+ hours per day of interdisciplinary therapy in a comprehensive inpatient rehab setting. Physiatrist is providing close team supervision and 24 hour management of active  medical problems listed below. Physiatrist and rehab team continue to assess barriers to discharge/monitor patient progress toward functional and medical goals.  Team conference today please see physician documentation under team conference tab, met with team face-to-face to discuss problems,progress, and goals. Formulized individual treatment plan based on medical history, underlying problem and comorbidities. FIM: Function - Bathing Position: Standing at sink Body parts bathed by patient: Right arm, Left arm, Chest, Abdomen, Front perineal area, Buttocks, Right upper leg, Left upper leg Body parts bathed by helper: Back, Right lower leg, Left lower leg Bathing not applicable: Front perineal area, Buttocks (pt  has been cleansed prior to session by nursing) Assist Level: Touching or steadying assistance(Pt > 75%)  Function- Upper Body Dressing/Undressing What is the patient wearing?: Bra, Pull over shirt/dress Bra - Perfomed by patient: Thread/unthread right bra strap, Hook/unhook bra (pull down sports bra) Bra - Perfomed by helper: Thread/unthread left bra strap Pull over shirt/dress - Perfomed by patient: Thread/unthread right sleeve, Put head through opening Pull over shirt/dress - Perfomed by helper: Pull shirt over trunk, Thread/unthread left sleeve Button up shirt - Perfomed by patient: Thread/unthread right sleeve Button up shirt - Perfomed by helper: Thread/unthread left sleeve, Pull shirt around back, Button/unbutton shirt Assist Level: Touching or steadying assistance(Pt > 75%) Function - Lower Body Dressing/Undressing What is the patient wearing?: [employer], Shoes, Pants Position: Standing at sink Underwear - Performed by patient: Thread/unthread right underwear leg Underwear - Performed by helper: Pull underwear up/down, Thread/unthread left underwear leg Pants- Performed by patient: Thread/unthread right pants leg, Pull pants up/down Pants- Performed by helper: Thread/unthread left pants leg Non-skid slipper socks- Performed by helper: Don/doff right sock, Don/doff left sock Shoes - Performed by helper: Don/doff right shoe, Don/doff left shoe, Fasten right, Fasten left TED Hose - Performed by helper: Don/doff right TED hose, Don/doff left TED hose Assist Level: Touching or steadying assistance (Pt > 75%)  Function - Toileting Toileting activity did not occur: No continent bowel/bladder event Toileting steps completed by patient: Adjust clothing prior to toileting Toileting steps completed by helper: Performs perineal hygiene, Adjust clothing after toileting Toileting Assistive Devices: Grab bar or rail Assist level: Touching or steadying assistance (Pt.75%)  Function - Engineer, petroleum transfer activity did not occur: Safety/medical concerns Toilet transfer assistive device: Grab bar, Walker Assist level to toilet: Touching or steadying assistance (Pt > 75%) Assist level from toilet: Touching or steadying assistance (Pt > 75%) Assist level to bedside commode (at bedside): Maximal assist (Pt 25 - 49%/lift and lower) Assist level from bedside commode (at bedside): Maximal assist (Pt 25 - 49%/lift and lower)  Function - Chair/bed transfer Chair/bed transfer method: Ambulatory Chair/bed transfer assist level: Touching or steadying assistance (Pt > 75%) Chair/bed transfer assistive device: Walker, Armrests Chair/bed transfer details: Verbal cues for sequencing, Verbal cues for safe use of DME/AE, Manual facilitation for weight shifting  Function - Locomotion: Wheelchair Will patient use wheelchair at discharge?: Yes Type: Manual Max wheelchair distance: 35' Assist Level: Maximal assistance (Pt 25 - 49%) Wheel 50 feet with 2 turns activity did not occur: Safety/medical concerns Assist Level: Dependent (Pt equals 0%) Wheel 150 feet activity did not occur: Safety/medical concerns Assist Level: Dependent (Pt equals 0%) Turns around,maneuvers to table,bed, and toilet,negotiates 3% grade,maneuvers on rugs and over doorsills: No Function - Locomotion: Ambulation Assistive device: Walker-rolling Max distance: 165' Assist level: Touching or steadying assistance (Pt > 75%) Assist level: Touching or steadying assistance (Pt > 75%) Assist level: Touching or  steadying assistance (Pt > 75%) Walk 150 feet activity did not occur: Safety/medical concerns Assist level: Touching or steadying assistance (Pt > 75%) Assist level: Maximal assist (Pt 25 - 49%)  Function - Comprehension Comprehension: Auditory Comprehension assist level: Understands basic 25 - 49% of the time/ requires cueing 50 - 75% of the time  Function - Expression Expression: Verbal Expression  assist level: Expresses basic 50 - 74% of the time/requires cueing 25 - 49% of the time. Needs to repeat parts of sentences.  Function - Social Interaction Social Interaction assist level: Interacts appropriately 25 - 49% of time - Needs frequent redirection.  Function - Problem Solving Problem solving assist level: Solves basic 25 - 49% of the time - needs direction more than half the time to initiate, plan or complete simple activities  Function - Memory Memory assist level: Recognizes or recalls less than 25% of the time/requires cueing greater than 75% of the time Patient normally able to recall (first 3 days only): None of the above  Medical Problem List and Plan: 1. Functional deficits secondary to right caudate intercerebral hemorrhage secondary to hypertensive crisis 2. DVT Prophylaxis/Anticoagulation: Subcutaneous heparin added for DVT prophylaxis 04/08/2015, change to lovenox for improved efficacy and reduced needle sticks 3. 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 ?inject 4. Hypertension. Clonidine 0.2 mg 3 times a day, Cardizem 240 mg daily, lisinopril 20 mg twice a day, Lopressor 25 mg twice a day. Will titrate BB upward for now , once intake improves will restart lasix 5. Neuropsych: This patient is capable of making decisions on her own behalf. 6. Skin/Wound Care: Routine skin checks 7. Fluids/Electrolytes/Nutrition: Routine I&O with follow-up chemistries, meal intake 10-50%, Intake ~552m fluid, IVF at noc 8. Systolic congestive heart failure. Check daily weights and I's and O's. Patient on Lasix 40 mg daily prior to admission. Resume as needed currently fluid intake very low , minimal pedal edema 9. Atrial fibrillation. Chronic Coumadin discontinued secondary to  intracerebral hemorrhage. Cardiac rate controlled. Continue Cardizem and Lopressor 10. Diabetes mellitus peripheral neuropathy. Hemoglobin A1c 5.9. Sliding scale insulin. Check blood sugars before meals and at bedtime. Patient on Glucophage 500 mg twice a day prior to admission.   -initiated glucophage 2574mbid but CBG dropped at 4pm yesterday, will hold for now, appetite is variable 11. Chronic renal insufficiency. Baseline creatinine 1.35. 1.4 most recently---push po fluids. 12. Hypothyroidism. TSH level 2.053. Continue Synthroid 13. History of gout. Zyloprim 100 mg daily. Not active currently  14. Hyperlipidemia. Resume statin at discharge. 15.  Hypokalemia- cont KCL now corrected. Recheck in am 16. Urinary incontinence: timed voids. ucx proteus S to Keflex  LOS (Days) 10 A FACE TO FACE EVALUATION WAS PERFORMED  KIRSTEINS,ANDREW E 04/22/2015, 6:56 AM

## 2015-04-22 NOTE — Progress Notes (Signed)
Occupational Therapy Session Note  Patient Details  Name: Nicole [name] MRN: [id number] Date of Birth: 12-09-1930  Today's Date: 04/22/2015 OT Individual Time: 1100-1200 OT Individual Time Calculation (min): 60 min    Short Term Goals: Week 1:  OT Short Term Goal 1 (Week 1): Pt will be able to transfer to toilet with min A. OT Short Term Goal 1 - Progress (Week 1): Progressing toward goal OT Short Term Goal 2 (Week 1): Pt will be able to toilet with min A. OT Short Term Goal 2 - Progress (Week 1): Progressing toward goal OT Short Term Goal 3 (Week 1): Pt will be able to don shirt with min A. OT Short Term Goal 3 - Progress (Week 1): Progressing toward goal OT Short Term Goal 4 (Week 1): Pt will don pants with mod A. OT Short Term Goal 4 - Progress (Week 1): Met OT Short Term Goal 5 (Week 1): Pt will actively use LUE to wash R arm with min cues. OT Short Term Goal 5 - Progress (Week 1): Met Week 2:  OT Short Term Goal 1 (Week 2): Pt will demo ability to lace left pant leg using AE with min assist OT Short Term Goal 2 (Week 2): Pt will demo ability to lace left shirt sleeve with vc to dress left first OT Short Term Goal 3 (Week 2): Pt will complete toilet hygiene standing supported with min assist for thoroughness OT Short Term Goal 4 (Week 2): Pt will groom (wash face, hands, brush teeth) standing at sink with sublte cues and setup assist.  Skilled Therapeutic Interventions/Progress Updates:    1:1 Self care retraining at shower level. Pt in recliner when arrived. Discussed "getting cleaned up and clothes on." Turned water on in bathroom for a contextual cues and to get bathroom warm.  Focused on functional ambulation with RW from recliner to toilet with max cues and assistance to guide walker.  Pt able to come into standing with min A and lower self on regular toilet with more than reasonable time but with min A with use of grab bar. Transitioned in to shower with HHA with more than  reasonable amt of time. Pt noted with perseveration  with washing body parts but able to be redirected with giving clinician wash cloth or performing a sit to stand. Assisted with LB due to time to allow pt to wash other parts. A pt with washing buttocks for a through cleaning after a previous BM. Pt ambulated back to recliner with HHA with less assistance (but still at a slow pace) due to not needing A with RW. Pt performed dressing in a quiet environment, extra time and clinician "looked busy" to cue pt to try first before resorting to asking for help first. Pt required A with underwear because it was a brief fastened to don as underwear. Pt able to perform sit to stand from recliner for clothing management with steadying A but with posterior LOB once standing - requiring mod A to regain focus.   Therapy Documentation Precautions:  Precautions Precautions: Fall Precaution Comments: L hemiplegia Restrictions Weight Bearing Restrictions: No Pain: Pain Assessment Pain Assessment: No/denies pain  See Function Navigator for Current Functional Status.   Therapy/Group: Individual Therapy  [facility] 04/22/2015, 3:44 PM

## 2015-04-22 NOTE — Progress Notes (Signed)
Physical Therapy Session Note  Patient Details  Name: Nicole [name] MRN: [id number] Date of Birth: 1930-12-16  Today's Date: 04/22/2015 PT Individual Time: 0900-1000 PT Individual Time Calculation (min): 60 min   Short Term Goals: Week 2:  PT Short Term Goal 1 (Week 2): Pt will demonstrate side lie to sit req min A.  PT Short Term Goal 2 (Week 2): Pt will demonstrate sit to stand req min A.  PT Short Term Goal 3 (Week 2): Pt will demonstrate sit to side lie req min A consistently.  PT Short Term Goal 4 (Week 2): Pt will consistently ambulate >= 150' with RW req min A.  PT Short Term Goal 5 (Week 2): Pt will ambulate up/down 3 steps with B rails req min A.   Skilled Therapeutic Interventions/Progress Updates:    Pt received seated in w/c with NA present; no c/o pain and agreeable to treatment. Pt able to accurately report she is at "[facility]" and she is here "to receive therapy" however unsure why she needs therapy. Reminded pt she had a stroke and is in rehab. Pt reports she would like to go to restroom. Stand pivot w/c >BSC with minA with very slow speed and mod verbal cues for foot placement due to apraxia and pt getting "stuck" with LLE unable to progress, as well as poor management of RW. Upon reaching standing position in front of BSC and therapist attempting to remove brief, pt states "I need my panties tightened up". Explained to pt that therapist is taking off brief so pt can use restroom, at which point pt states she does not need to use the restroom. Pivoted back to w/c with minA and RW; upon reaching w/c pt loses balance posteriorly and requires assistance to slowly lower to w/c. W/c propulsion in hallway x60' including turns with R hemi technique, mod cues for technique and encouragement to increase speed with little success. Gait training with RW x65' and minA; extremely slow speed and mod cues for management of RW and progression of LEs, as well as manual facilitation of weight shifting  while turning. Sitting and standing balance activity performed with pt performing BUE reaching task to match cards to board in front of pt. Performed to improve sustained attention to task, memory and recall of instructions, visual scanning, as well as sitting/standing balance and endurance. Pt returned to room; transfer stand pivot w/c >recliner without AD minA. Remained seated in recliner with QR belt intact, all needs in reach at completion of session.   Therapy Documentation Precautions:  Precautions Precautions: Fall Precaution Comments: L hemiplegia Restrictions Weight Bearing Restrictions: No Pain: Pain Assessment Pain Assessment: No/denies pain Pain Score: 0-No pain   See Function Navigator for Current Functional Status.   Therapy/Group: Individual Therapy  Luberta Mutter 04/22/2015, 10:35 AM

## 2015-04-22 NOTE — Progress Notes (Signed)
Occupational Therapy Session Note  Patient Details  Name: Nicole [name] MRN: [id number] Date of Birth: 05/28/1931  Today's Date: 04/22/2015 OT Individual Time: 1430-1500 OT Individual Time Calculation (min): 30 min    Short Term Goals: Week 1:  OT Short Term Goal 1 (Week 1): Pt will be able to transfer to toilet with min A. OT Short Term Goal 1 - Progress (Week 1): Progressing toward goal OT Short Term Goal 2 (Week 1): Pt will be able to toilet with min A. OT Short Term Goal 2 - Progress (Week 1): Progressing toward goal OT Short Term Goal 3 (Week 1): Pt will be able to don shirt with min A. OT Short Term Goal 3 - Progress (Week 1): Progressing toward goal OT Short Term Goal 4 (Week 1): Pt will don pants with mod A. OT Short Term Goal 4 - Progress (Week 1): Met OT Short Term Goal 5 (Week 1): Pt will actively use LUE to wash R arm with min cues. OT Short Term Goal 5 - Progress (Week 1): Met Week 2:  OT Short Term Goal 1 (Week 2): Pt will demo ability to lace left pant leg using AE with min assist OT Short Term Goal 2 (Week 2): Pt will demo ability to lace left shirt sleeve with vc to dress left first OT Short Term Goal 3 (Week 2): Pt will complete toilet hygiene standing supported with min assist for thoroughness OT Short Term Goal 4 (Week 2): Pt will groom (wash face, hands, brush teeth) standing at sink with sublte cues and setup assist.  Skilled Therapeutic Interventions/Progress Updates:    Skilled OT intervention with treatment focus on the following:  LUE NMRE, attention to left.  Engaged in folding blanket activities for LUE use and attending to left.  Ppt used LUE in increased time for motor planning and initiation.  Pt Needed verbal cues to attend to Left hand during folding.  Ambulated with RW to bed and transferred to bed with SBA.     Therapy Documentation Precautions:  Precautions Precautions: Fall Precaution Comments: L hemiplegia Restrictions Weight Bearing  Restrictions: No    Vital Signs: Therapy Vitals Temp: 97.5 F (36.4 C) Temp Source: Oral Pulse Rate: 69 Resp: 18 BP: 114/65 mmHg Patient Position (if appropriate): Sitting Oxygen Therapy SpO2: 100 % O2 Device: Not Delivered Pain: Pain Assessment Pain Assessment: No/denies pain        See Function Navigator for Current Functional Status.   Therapy/Group: Individual Therapy  Lisa Roca 04/22/2015, 5:51 PM

## 2015-04-23 ENCOUNTER — Inpatient Hospital Stay (HOSPITAL_COMMUNITY): Payer: Medicare Other | Admitting: *Deleted

## 2015-04-23 DIAGNOSIS — I610 Nontraumatic intracerebral hemorrhage in hemisphere, subcortical: Secondary | ICD-10-CM

## 2015-04-23 DIAGNOSIS — E1140 Type 2 diabetes mellitus with diabetic neuropathy, unspecified: Secondary | ICD-10-CM

## 2015-04-23 DIAGNOSIS — G8190 Hemiplegia, unspecified affecting unspecified side: Secondary | ICD-10-CM

## 2015-04-23 DIAGNOSIS — I481 Persistent atrial fibrillation: Secondary | ICD-10-CM

## 2015-04-23 DIAGNOSIS — I5032 Chronic diastolic (congestive) heart failure: Secondary | ICD-10-CM

## 2015-04-23 LAB — BASIC METABOLIC PANEL
Anion gap: 7 (ref 5–15)
BUN: 15 mg/dL (ref 6–20)
CALCIUM: 8.9 mg/dL (ref 8.9–10.3)
CO2: 24 mmol/L (ref 22–32)
CREATININE: 1.14 mg/dL — AB (ref 0.44–1.00)
Chloride: 103 mmol/L (ref 101–111)
GFR calc non Af Amer: 44 mL/min — ABNORMAL LOW (ref >60)
GFR, EST AFRICAN AMERICAN: 50 mL/min — AB (ref >60)
GLUCOSE: 161 mg/dL — AB (ref 65–99)
Potassium: 4.2 mmol/L (ref 3.5–5.1)
Sodium: 134 mmol/L — ABNORMAL LOW (ref 135–145)

## 2015-04-23 LAB — GLUCOSE, CAPILLARY
Glucose-Capillary: 141 mg/dL — ABNORMAL HIGH (ref 65–99)
Glucose-Capillary: 193 mg/dL — ABNORMAL HIGH (ref 65–99)
Glucose-Capillary: 159 mg/dL — ABNORMAL HIGH (ref 65–99)
Glucose-Capillary: 113 mg/dL — ABNORMAL HIGH (ref 65–99)

## 2015-04-23 MED ORDER — PANTOPRAZOLE SODIUM 40 MG PO PACK
40.0000 mg | PACK | Freq: Every day | ORAL | Status: DC
  Administered 2015-04-23 – 2015-05-02 (×9): 40 mg via ORAL
  Filled 2015-04-23 (×11): qty 20

## 2015-04-23 MED ORDER — PANTOPRAZOLE SODIUM 40 MG PO PACK
40.0000 mg | PACK | Freq: Every day | ORAL | Status: DC
  Filled 2015-04-23 (×2): qty 20

## 2015-04-23 MED ORDER — DILTIAZEM 12 MG/ML ORAL SUSPENSION
60.0000 mg | Freq: Four times a day (QID) | ORAL | Status: DC
  Administered 2015-04-23 – 2015-05-02 (×37): 60 mg via ORAL
  Filled 2015-04-23 (×43): qty 6

## 2015-04-23 NOTE — Plan of Care (Signed)
Problem: RH SAFETY Goal: RH STG ADHERE TO SAFETY PRECAUTIONS W/ASSISTANCE/DEVICE STG Adhere to Safety Precautions With Assistance/Device. Supervision  Outcome: Not Progressing Patient is AOx1 with max cues for transfer.

## 2015-04-23 NOTE — Progress Notes (Signed)
Patient ID: Nicole [name], female   DOB: [birth year], 81 y.o.   MRN: [id number]   Subjective/Complaints: Pt states she slept well overnight.  She feels she is improving slowly.    ROS- Denies bowel, bladder breathing or pain issues, limited due to cognition  Objective: Vital Signs: Blood pressure 161/93, pulse 73, temperature 98.4 F (36.9 C), temperature source Oral, resp. rate 17, height _0  (1.753 m), weight 76 kg (167 lb 8.8 oz), SpO2 100 %. No results found. Results for orders placed or performed during the hospital encounter of 04/12/15 (from the past 72 hour(s))  Glucose, capillary     Status: Abnormal   Collection Time: 04/20/15 11:59 AM  Result Value Ref Range   Glucose-Capillary 213 (H) 65 - 99 mg/dL  Glucose, capillary     Status: Abnormal   Collection Time: 04/20/15  4:46 PM  Result Value Ref Range   Glucose-Capillary 161 (H) 65 - 99 mg/dL  Glucose, capillary     Status: Abnormal   Collection Time: 04/20/15  8:49 PM  Result Value Ref Range   Glucose-Capillary 182 (H) 65 - 99 mg/dL  Glucose, capillary     Status: Abnormal   Collection Time: 04/21/15  6:54 AM  Result Value Ref Range   Glucose-Capillary 166 (H) 65 - 99 mg/dL  Glucose, capillary     Status: Abnormal   Collection Time: 04/21/15 11:29 AM  Result Value Ref Range   Glucose-Capillary 267 (H) 65 - 99 mg/dL  Glucose, capillary     Status: Abnormal   Collection Time: 04/21/15  4:32 PM  Result Value Ref Range   Glucose-Capillary 53 (L) 65 - 99 mg/dL  Glucose, capillary     Status: None   Collection Time: 04/21/15  4:49 PM  Result Value Ref Range   Glucose-Capillary 75 65 - 99 mg/dL  Glucose, capillary     Status: Abnormal   Collection Time: 04/21/15  9:08 PM  Result Value Ref Range   Glucose-Capillary 171 (H) 65 - 99 mg/dL   Comment 1 Notify RN   Glucose, capillary     Status: Abnormal   Collection Time: 04/22/15  6:58 AM  Result Value Ref Range   Glucose-Capillary 144 (H) 65 - 99 mg/dL  Glucose,  capillary     Status: Abnormal   Collection Time: 04/22/15 11:52 AM  Result Value Ref Range   Glucose-Capillary 194 (H) 65 - 99 mg/dL  Glucose, capillary     Status: Abnormal   Collection Time: 04/22/15  4:05 PM  Result Value Ref Range   Glucose-Capillary 183 (H) 65 - 99 mg/dL  Glucose, capillary     Status: Abnormal   Collection Time: 04/22/15  8:36 PM  Result Value Ref Range   Glucose-Capillary 180 (H) 65 - 99 mg/dL   Comment 1 Notify RN   Glucose, capillary     Status: Abnormal   Collection Time: 04/23/15  6:32 AM  Result Value Ref Range   Glucose-Capillary 141 (H) 65 - 99 mg/dL   Comment 1 Notify RN   Basic metabolic panel     Status: Abnormal   Collection Time: 04/23/15  7:20 AM  Result Value Ref Range   Sodium 134 (L) 135 - 145 mmol/L   Potassium 4.2 3.5 - 5.1 mmol/L   Chloride 103 101 - 111 mmol/L   CO2 24 22 - 32 mmol/L   Glucose, Bld 161 (H) 65 - 99 mg/dL   BUN 15 6 - 20 mg/dL   Creatinine,  Ser 1.14 (H) 0.44 - 1.00 mg/dL   Calcium 8.9 8.9 - 10.3 mg/dL   GFR calc non Af Amer 44 (L) >60 mL/min   GFR calc Af Amer 50 (L) >60 mL/min    Comment: (NOTE) The eGFR has been calculated using the CKD EPI equation. This calculation has not been validated in all clinical situations. eGFR's persistently <60 mL/min signify possible Chronic Kidney Disease.    Anion gap 7 5 - 15     HEENT: normal Cardio: IRRR and diastolic murmur Resp: CTA B/L and unlabored GI: BS positive and non distended Extremity:  No Edema Skin:   Intact Neuro: pleasantly Confused, Flat, Cranial Nerve grossly II-XII normal, Abnormal Motor 3- Left delt bi tri 2-grip 3- L HF, 4- KE , 3- ankle DF, 5/5 on Right side and Abnormal [facility] Ataxic/ dec [facility] Musc/Skel:  Left shoulder pain with all movement incl ext rotation (slightly improved). ROM very restricted Gen NAD  Assessment/Plan: 1. Functional deficits secondary to right caudate intercerebral hemorrhage secondary to hypertensive crisis with left hemiparesis  and cognitive deficits which require 3+ hours per day of interdisciplinary therapy in a comprehensive inpatient rehab setting. Physiatrist is providing close team supervision and 24 hour management of active medical problems listed below. Physiatrist and rehab team continue to assess barriers to discharge/monitor patient progress toward functional and medical goals.  Team conference today please see physician documentation under team conference tab, met with team face-to-face to discuss problems,progress, and goals. Formulized individual treatment plan based on medical history, underlying problem and comorbidities. FIM: Function - Bathing Position: Shower Body parts bathed by patient: Right arm, Left arm, Chest, Abdomen, Front perineal area, Right upper leg, Left upper leg Body parts bathed by helper: Buttocks, Right lower leg, Left lower leg, Back Bathing not applicable: Front perineal area, Buttocks (pt has been cleansed prior to session by nursing) Assist Level: Touching or steadying assistance(Pt > 75%)  Function- Upper Body Dressing/Undressing What is the patient wearing?: Bra, Pull over shirt/dress Bra - Perfomed by patient: Thread/unthread right bra strap, Thread/unthread left bra strap, Hook/unhook bra (pull down sports bra) Bra - Perfomed by helper: Thread/unthread left bra strap Pull over shirt/dress - Perfomed by patient: Thread/unthread right sleeve, Thread/unthread left sleeve, Put head through opening, Pull shirt over trunk Pull over shirt/dress - Perfomed by helper: Pull shirt over trunk, Thread/unthread left sleeve Button up shirt - Perfomed by patient: Thread/unthread right sleeve Button up shirt - Perfomed by helper: Thread/unthread left sleeve, Pull shirt around back, Button/unbutton shirt Assist Level: Supervision or verbal cues, More than reasonable time Function - Lower Body Dressing/Undressing What is the patient wearing?: Shoes, AFO, Underwear, Pants Position: Standing  at sink Underwear - Performed by patient: Pull underwear up/down Underwear - Performed by helper: Thread/unthread right underwear leg, Thread/unthread left underwear leg Pants- Performed by patient: Thread/unthread right pants leg, Thread/unthread left pants leg Pants- Performed by helper: Pull pants up/down Non-skid slipper socks- Performed by helper: Don/doff right sock, Don/doff left sock Shoes - Performed by patient: Don/doff right shoe, Don/doff left shoe Shoes - Performed by helper: Fasten right, Fasten left TED Hose - Performed by helper: Don/doff right TED hose, Don/doff left TED hose Assist Level: Touching or steadying assistance (Pt > 75%)  Function - Toileting Toileting activity did not occur: No continent bowel/bladder event Toileting steps completed by patient: Adjust clothing prior to toileting, Performs perineal hygiene (2/2 steps) Toileting steps completed by helper: Adjust clothing prior to toileting, Performs perineal hygiene, Adjust clothing after  toileting Toileting Assistive Devices: Grab bar or rail Assist level: More than reasonable time, Touching or steadying assistance (Pt.75%)  Function - Air cabin crew transfer activity did not occur: Safety/medical concerns Toilet transfer assistive device: Grab bar Assist level to toilet: Touching or steadying assistance (Pt > 75%) Assist level from toilet: Touching or steadying assistance (Pt > 75%) Assist level to bedside commode (at bedside): Maximal assist (Pt 25 - 49%/lift and lower) Assist level from bedside commode (at bedside): Maximal assist (Pt 25 - 49%/lift and lower)  Function - Chair/bed transfer Chair/bed transfer method: Stand pivot Chair/bed transfer assist level: Touching or steadying assistance (Pt > 75%) Chair/bed transfer assistive device: Walker, Armrests Chair/bed transfer details: Verbal cues for sequencing, Verbal cues for safe use of DME/AE, Manual facilitation for weight  shifting  Function - Locomotion: Wheelchair Will patient use wheelchair at discharge?: Yes Type: Manual Max wheelchair distance: 50 Assist Level: Moderate assistance (Pt 50 - 74%) Wheel 50 feet with 2 turns activity did not occur: Safety/medical concerns Assist Level: Moderate assistance (Pt 50 - 74%) Wheel 150 feet activity did not occur: Safety/medical concerns Assist Level: Dependent (Pt equals 0%) Turns around,maneuvers to table,bed, and toilet,negotiates 3% grade,maneuvers on rugs and over doorsills: No Function - Locomotion: Ambulation Assistive device: Walker-rolling Max distance: 60 Assist level: Touching or steadying assistance (Pt > 75%) Assist level: Touching or steadying assistance (Pt > 75%) Assist level: Touching or steadying assistance (Pt > 75%) Walk 150 feet activity did not occur: Safety/medical concerns Assist level: Touching or steadying assistance (Pt > 75%) Assist level: Maximal assist (Pt 25 - 49%)  Function - Comprehension Comprehension: Auditory Comprehension assist level: Understands basic 50 - 74% of the time/ requires cueing 25 - 49% of the time  Function - Expression Expression: Verbal Expression assist level: Expresses basic 50 - 74% of the time/requires cueing 25 - 49% of the time. Needs to repeat parts of sentences.  Function - Social Interaction Social Interaction assist level: Interacts appropriately 75 - 89% of the time - Needs redirection for appropriate language or to initiate interaction.  Function - Problem Solving Problem solving assist level: Solves basic 25 - 49% of the time - needs direction more than half the time to initiate, plan or complete simple activities  Function - Memory Memory assist level: Recognizes or recalls less than 25% of the time/requires cueing greater than 75% of the time Patient normally able to recall (first 3 days only): None of the above  Medical Problem List and Plan: 1. Functional deficits secondary to  right caudate intercerebral hemorrhage secondary to hypertensive crisis 2. DVT Prophylaxis/Anticoagulation: Subcutaneous heparin added for DVT prophylaxis 04/08/2015, change to lovenox for improved efficacy and reduced needle sticks 3. 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 ?inject 4. Hypertension. Clonidine 0.2 mg 3 times a day, Cardizem 240 mg daily, changed to 21m q6 on 10/1 due to the necessity of crushing medication, lisinopril 20 mg twice a day, Lopressor 25 mg twice a day. Will titrate BB upward for now , once intake improves will restart lasix 5. Neuropsych: This patient is capable of making decisions on her own behalf. 6. Skin/Wound Care: Routine skin checks 7. Fluids/Electrolytes/Nutrition: Routine I&O with follow-up chemistries, meal intake 10-50%, Intake ~5212mfluid, IVF at noc 8. Systolic congestive heart failure. Check daily  weights and I's and O's. Patient on Lasix 40 mg daily prior to admission. Resume as needed currently fluid intake very low , minimal pedal edema 9. Atrial fibrillation. Chronic Coumadin discontinued secondary to intracerebral hemorrhage. Cardiac rate controlled. Continue Cardizem and Lopressor 10. Diabetes mellitus peripheral neuropathy. Hemoglobin A1c 5.9. Sliding scale insulin. Check blood sugars before meals and at bedtime. Patient on Glucophage 500 mg twice a day prior to admission.   -initiated glucophage 28m bid but CBG dropped, will hold for now, appetite is variable 11. Chronic renal insufficiency. Baseline creatinine 1.35. 1.14 most recently--- cont to push po fluids. 12. Hypothyroidism. TSH level 2.053. Continue Synthroid 13. History of gout. Zyloprim 100 mg daily. Not active currently  14. Hyperlipidemia. Resume statin at discharge. 15.  Hypokalemia- cont KCL now corrected. Recheck in am 16. Urinary incontinence: timed voids. ucx proteus S to Keflex 17. Hyponatremia: 134 on 10/1, will cont to monitor  LOS (Days) 11 A FACE TO FACE EVALUATION WAS PERFORMED  Ankit ALorie Phenix10/07/2014, 9:07 AM

## 2015-04-23 NOTE — Progress Notes (Signed)
Physical Therapy Session Note  Patient Details  Name: Nicole [name] MRN: [id number] Date of Birth: [birth year]  Today's Date: 04/23/2015 PT Individual Time: 1130-1200 PT Individual Time Calculation (min): 30 min    Skilled Therapeutic Interventions/Progress Updates:  Patient in bed at the beginning of the session, no complains of pain or discomfort agrees to therapy interventions. Assistance to come to sitting EOB -mod A and increased initiation cues. Sit to stand with min to mod A.  Gait training with FWW and increased manual and verbal cues to progress L LE. After about 40 feet  Patient needed to use the bathroom. Transfer from w/c to commode with mod A. Nurse tech assisted with hygiene,while therapist provided assistance for standing.  Patient stiid by the sink to wash hands, needs cues for sequencing and task initiation.  At the end of session left in a room , with QR belt on in w/c with all needs within reach and family present.    Therapy Documentation Precautions:  Precautions Precautions: Fall Precaution Comments: L hemiplegia Restrictions Weight Bearing Restrictions: No Vital Signs: Therapy Vitals Temp: 98.6 F (37 C) Temp Source: Oral Pulse Rate: 94 Resp: 18 BP: 137/82 mmHg Patient Position (if appropriate): Lying Oxygen Therapy SpO2: 96 % O2 Device: Not Delivered Pain: Pain Assessment Pain Assessment: No/denies pain Pain Score: 0-No pain    See Function Navigator for Current Functional Status.   Therapy/Group: Individual Therapy  [place] Spanish 04/23/2015, 12:31 PM

## 2015-04-24 ENCOUNTER — Inpatient Hospital Stay (HOSPITAL_COMMUNITY): Payer: Medicare Other

## 2015-04-24 DIAGNOSIS — I481 Persistent atrial fibrillation: Secondary | ICD-10-CM

## 2015-04-24 DIAGNOSIS — G8190 Hemiplegia, unspecified affecting unspecified side: Secondary | ICD-10-CM

## 2015-04-24 DIAGNOSIS — E1140 Type 2 diabetes mellitus with diabetic neuropathy, unspecified: Secondary | ICD-10-CM

## 2015-04-24 DIAGNOSIS — I5032 Chronic diastolic (congestive) heart failure: Secondary | ICD-10-CM

## 2015-04-24 DIAGNOSIS — I610 Nontraumatic intracerebral hemorrhage in hemisphere, subcortical: Secondary | ICD-10-CM

## 2015-04-24 LAB — GLUCOSE, CAPILLARY
GLUCOSE-CAPILLARY: 172 mg/dL — AB (ref 65–99)
Glucose-Capillary: 136 mg/dL — ABNORMAL HIGH (ref 65–99)
Glucose-Capillary: 206 mg/dL — ABNORMAL HIGH (ref 65–99)
Glucose-Capillary: 149 mg/dL — ABNORMAL HIGH (ref 65–99)

## 2015-04-24 NOTE — Progress Notes (Addendum)
Physical Therapy Session Note  Patient Details  Name: Nicole [name] MRN: [id number] Date of Birth: [birth year]  Today's Date: 04/24/2015 PT Individual Time: 1403-1510 PT Individual Time Calculation (min): 67 min   Short Term Goals: Week 2:  PT Short Term Goal 1 (Week 2): Pt will demonstrate side lie to sit req min A.  PT Short Term Goal 2 (Week 2): Pt will demonstrate sit to stand req min A.  PT Short Term Goal 3 (Week 2): Pt will demonstrate sit to side lie req min A consistently.  PT Short Term Goal 4 (Week 2): Pt will consistently ambulate >= 150' with RW req min A.  PT Short Term Goal 5 (Week 2): Pt will ambulate up/down 3 steps with B rails req min A.      Skilled Therapeutic Interventions/Progress Updates: son and 2 granddaughters attended and observed tx.  Pt with language of confusion initially during session, but improved generally by end of session.  Pt demonstrated humor.  Toilet transfer before leaving room.  Mod assist needed to control descent.  Pt continent of urine, but brief slightly soiled with stool. Stand by assist  for balance during pt using wash cloth for cleaning bottom while standing up.  Standing at sink to wash hands with min guard assist.  neuromuscular re-education via demo, VCs, manual cues for bil hip adduction/abduction, kicking beach ball with R/L feet; 10 x 1 each in supported sitting.  Standing balance min guard assist during 3 minute bil UE task of folding pillow caes.  Mod cues to attend to L foot position for sit>stand, and to L side of pillow cases.  Sit>< stand required manual cues for forward wt shift.  Pt demonstrates poor eccentric control during descent.  Gait with RW x 225' with min guard assist, cues for forward gaze, longer steps, increased velocity.  Up/down 12 corner steps with 2 rails, step- to ascending/descending 6" high steps, step- through ascending/descending 3" high steps.    PT returned pt to room in w/c.  Quick release belt applied,  pancake call bell placed in lap.  PT reviewed use of call bell with pt; she was able to use pancake with leading questions.  Family friend present; family departed.    Therapy Documentation Precautions:  Precautions Precautions: Fall Precaution Comments: L hemiplegia Restrictions Weight Bearing Restrictions: No   Pain: Pain Assessment Pain Assessment: No/denies pain      See Function Navigator for Current Functional Status.   Therapy/Group: Individual Therapy  Bentzion Dauria 04/24/2015, 3:23 PM

## 2015-04-24 NOTE — Progress Notes (Signed)
Patient ID: Nicole [name], female   DOB: 08/27/1930, 82 y.o.   MRN: [id number]   Subjective/Complaints: Pt states she slept well overnight.  She is jovial this morning while eating breakfast.     ROS- Denies bowel, bladder breathing or pain issues, limited due to cognition  Objective: Vital Signs: Blood pressure 114/55, pulse 63, temperature 97.8 F (36.6 C), temperature source Oral, resp. rate 18, height 5' 9"  (1.753 m), weight 76 kg (167 lb 8.8 oz), SpO2 100 %. No results found. Results for orders placed or performed during the hospital encounter of 04/12/15 (from the past 72 hour(s))  Glucose, capillary     Status: Abnormal   Collection Time: 04/21/15  4:32 PM  Result Value Ref Range   Glucose-Capillary 53 (L) 65 - 99 mg/dL  Glucose, capillary     Status: None   Collection Time: 04/21/15  4:49 PM  Result Value Ref Range   Glucose-Capillary 75 65 - 99 mg/dL  Glucose, capillary     Status: Abnormal   Collection Time: 04/21/15  9:08 PM  Result Value Ref Range   Glucose-Capillary 171 (H) 65 - 99 mg/dL   Comment 1 Notify RN   Glucose, capillary     Status: Abnormal   Collection Time: 04/22/15  6:58 AM  Result Value Ref Range   Glucose-Capillary 144 (H) 65 - 99 mg/dL  Glucose, capillary     Status: Abnormal   Collection Time: 04/22/15 11:52 AM  Result Value Ref Range   Glucose-Capillary 194 (H) 65 - 99 mg/dL  Glucose, capillary     Status: Abnormal   Collection Time: 04/22/15  4:05 PM  Result Value Ref Range   Glucose-Capillary 183 (H) 65 - 99 mg/dL  Glucose, capillary     Status: Abnormal   Collection Time: 04/22/15  8:36 PM  Result Value Ref Range   Glucose-Capillary 180 (H) 65 - 99 mg/dL   Comment 1 Notify RN   Glucose, capillary     Status: Abnormal   Collection Time: 04/23/15  6:32 AM  Result Value Ref Range   Glucose-Capillary 141 (H) 65 - 99 mg/dL   Comment 1 Notify RN   Basic metabolic panel     Status: Abnormal   Collection Time: 04/23/15  7:20 AM  Result  Value Ref Range   Sodium 134 (L) 135 - 145 mmol/L   Potassium 4.2 3.5 - 5.1 mmol/L   Chloride 103 101 - 111 mmol/L   CO2 24 22 - 32 mmol/L   Glucose, Bld 161 (H) 65 - 99 mg/dL   BUN 15 6 - 20 mg/dL   Creatinine, Ser 1.14 (H) 0.44 - 1.00 mg/dL   Calcium 8.9 8.9 - 10.3 mg/dL   GFR calc non Af Amer 44 (L) >60 mL/min   GFR calc Af Amer 50 (L) >60 mL/min    Comment: (NOTE) The eGFR has been calculated using the CKD EPI equation. This calculation has not been validated in all clinical situations. eGFR's persistently <60 mL/min signify possible Chronic Kidney Disease.    Anion gap 7 5 - 15  Glucose, capillary     Status: Abnormal   Collection Time: 04/23/15 11:29 AM  Result Value Ref Range   Glucose-Capillary 193 (H) 65 - 99 mg/dL  Glucose, capillary     Status: Abnormal   Collection Time: 04/23/15  4:41 PM  Result Value Ref Range   Glucose-Capillary 159 (H) 65 - 99 mg/dL  Glucose, capillary     Status: Abnormal  Collection Time: 04/23/15  8:49 PM  Result Value Ref Range   Glucose-Capillary 113 (H) 65 - 99 mg/dL   Comment 1 Notify RN   Glucose, capillary     Status: Abnormal   Collection Time: 04/24/15  6:30 AM  Result Value Ref Range   Glucose-Capillary 136 (H) 65 - 99 mg/dL   Comment 1 Notify RN   Glucose, capillary     Status: Abnormal   Collection Time: 04/24/15 11:23 AM  Result Value Ref Range   Glucose-Capillary 206 (H) 65 - 99 mg/dL     HEENT: normal Cardio: IRRR and diastolic murmur Resp: CTA B/L and unlabored GI: BS positive and non distended Extremity:  No Edema Skin:   Intact Neuro: pleasantly Confused, Flat, Cranial Nerve grossly II-XII normal, Abnormal Motor 3- Left delt bi tri 3 grip 3- L HF, 4- KE , 3- ankle DF, 5/5 on Right side and Abnormal [facility] Ataxic/ dec [facility] Musc/Skel:  Left shoulder pain with all movement incl ext rotation (slightly improved). ROM very restricted Gen NAD  Assessment/Plan: 1. Functional deficits secondary to right caudate  intercerebral hemorrhage secondary to hypertensive crisis with left hemiparesis and cognitive deficits which require 3+ hours per day of interdisciplinary therapy in a comprehensive inpatient rehab setting. Physiatrist is providing close team supervision and 24 hour management of active medical problems listed below. Physiatrist and rehab team continue to assess barriers to discharge/monitor patient progress toward functional and medical goals.  Team conference today please see physician documentation under team conference tab, met with team face-to-face to discuss problems,progress, and goals. Formulized individual treatment plan based on medical history, underlying problem and comorbidities. FIM: Function - Bathing Position: Shower Body parts bathed by patient: Right arm, Left arm, Chest, Abdomen, Front perineal area, Right upper leg, Left upper leg Body parts bathed by helper: Buttocks, Right lower leg, Left lower leg, Back Bathing not applicable: Front perineal area, Buttocks (pt has been cleansed prior to session by nursing) Assist Level: Touching or steadying assistance(Pt > 75%)  Function- Upper Body Dressing/Undressing What is the patient wearing?: Bra, Pull over shirt/dress Bra - Perfomed by patient: Thread/unthread right bra strap, Thread/unthread left bra strap, Hook/unhook bra (pull down sports bra) Bra - Perfomed by helper: Thread/unthread left bra strap Pull over shirt/dress - Perfomed by patient: Thread/unthread right sleeve, Thread/unthread left sleeve, Put head through opening, Pull shirt over trunk Pull over shirt/dress - Perfomed by helper: Pull shirt over trunk, Thread/unthread left sleeve Button up shirt - Perfomed by patient: Thread/unthread right sleeve Button up shirt - Perfomed by helper: Thread/unthread left sleeve, Pull shirt around back, Button/unbutton shirt Assist Level: Supervision or verbal cues, More than reasonable time Function - Lower Body  Dressing/Undressing What is the patient wearing?: Shoes, AFO, Underwear, Pants Position: Standing at sink Underwear - Performed by patient: Pull underwear up/down Underwear - Performed by helper: Thread/unthread right underwear leg, Thread/unthread left underwear leg Pants- Performed by patient: Thread/unthread right pants leg, Thread/unthread left pants leg Pants- Performed by helper: Pull pants up/down Non-skid slipper socks- Performed by helper: Don/doff right sock, Don/doff left sock Shoes - Performed by patient: Don/doff right shoe, Don/doff left shoe Shoes - Performed by helper: Fasten right, Fasten left TED Hose - Performed by helper: Don/doff right TED hose, Don/doff left TED hose Assist Level: Touching or steadying assistance (Pt > 75%)  Function - Toileting Toileting activity did not occur: No continent bowel/bladder event Toileting steps completed by patient: Performs perineal hygiene Toileting steps completed by helper: Adjust  clothing prior to toileting, Performs perineal hygiene, Adjust clothing after toileting Toileting Assistive Devices: Grab bar or rail Assist level: Two helpers  Function - Air cabin crew transfer activity did not occur: Safety/medical concerns Toilet transfer assistive device: Bedside commode Assist level to toilet: 2 helpers Assist level from toilet: 2 helpers Assist level to bedside commode (at bedside): 2 helpers Assist level from bedside commode (at bedside): 2 helpers  Function - Chair/bed transfer Chair/bed transfer [place]: Stand pivot Chair/bed transfer assist level: Touching or steadying assistance (Pt > 75%) Chair/bed transfer assistive device: Walker, Armrests Chair/bed transfer details: Verbal cues for sequencing, Verbal cues for safe use of DME/AE, Manual facilitation for weight shifting  Function - Locomotion: Wheelchair Will patient use wheelchair at discharge?: Yes Type: Manual Max wheelchair distance: 50 Assist Level:  Moderate assistance (Pt 50 - 74%) Wheel 50 feet with 2 turns activity did not occur: Safety/medical concerns Assist Level: Moderate assistance (Pt 50 - 74%) Wheel 150 feet activity did not occur: Safety/medical concerns Assist Level: Dependent (Pt equals 0%) Turns around,maneuvers to table,bed, and toilet,negotiates 3% grade,maneuvers on rugs and over doorsills: No Function - Locomotion: Ambulation Assistive device: Walker-rolling Max distance: 60 Assist level: Touching or steadying assistance (Pt > 75%) Assist level: Touching or steadying assistance (Pt > 75%) Assist level: Touching or steadying assistance (Pt > 75%) Walk 150 feet activity did not occur: Safety/medical concerns Assist level: Touching or steadying assistance (Pt > 75%) Assist level: Maximal assist (Pt 25 - 49%)  Function - Comprehension Comprehension: Auditory Comprehension assist level: Understands basic 50 - 74% of the time/ requires cueing 25 - 49% of the time  Function - Expression Expression: Verbal Expression assist level: Expresses basic 50 - 74% of the time/requires cueing 25 - 49% of the time. Needs to repeat parts of sentences.  Function - Social Interaction Social Interaction assist level: Interacts appropriately 50 - 74% of the time - May be physically or verbally inappropriate.  Function - Problem Solving Problem solving assist level: Solves basic 25 - 49% of the time - needs direction more than half the time to initiate, plan or complete simple activities  Function - Memory Memory assist level: Recognizes or recalls 25 - 49% of the time/requires cueing 50 - 75% of the time Patient normally able to recall (first 3 days only): None of the above  Medical Problem List and Plan: 1. Functional deficits secondary to right caudate intercerebral hemorrhage secondary to hypertensive crisis 2. DVT Prophylaxis/Anticoagulation: Subcutaneous heparin added for DVT prophylaxis 04/08/2015, change to lovenox for  improved efficacy and reduced needle sticks 3. 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 ?inject 4. Hypertension. Clonidine 0.2 mg 3 times a day, Cardizem 240 mg daily, changed to 60m q6 on 10/1 due to the necessity of crushing medication, lisinopril 20 mg twice a day, Lopressor 25 mg twice a day. Will maintain BB dose; once intake improves will restart lasix 5. Neuropsych: This patient is capable of making decisions on her own behalf. 6. Skin/Wound Care: Routine skin checks 7. Fluids/Electrolytes/Nutrition: Routine I&O with follow-up chemistries, meal intake 10-50%, Intake ~1215mfluid, IVF at noc 8. Systolic congestive heart failure. Check daily weights and I's and O's. Patient on Lasix 40 mg daily prior to admission. Resume as needed currently fluid intake very low , minimal pedal  edema 9. Atrial fibrillation. Chronic Coumadin discontinued secondary to intracerebral hemorrhage. Cardiac rate controlled. Continue Cardizem and Lopressor 10. Diabetes mellitus peripheral neuropathy. Hemoglobin A1c 5.9. Sliding scale insulin. Check blood sugars before meals and at bedtime. Patient on Glucophage 500 mg twice a day prior to admission.   -initiated glucophage 253m bid but CBG dropped, will hold for now, appetite is variable  - Relatively well controlled, but difficult to maintain ideal values due to erratic diet. 11. Chronic renal insufficiency. Baseline creatinine 1.35. 1.14 most recently--- cont to push po fluids. 12. Hypothyroidism. TSH level 2.053. Continue Synthroid 13. History of gout. Zyloprim 100 mg daily. Not active currently  14. Hyperlipidemia. Resume statin at discharge. 15. Hypokalemia- cont KCL now corrected. Recheck in am 16. Urinary incontinence: timed voids. ucx proteus S to  Keflex 17. Hyponatremia: 134 on 10/1, will cont to monitor  LOS (Days) 12 A FACE TO FACE EVALUATION WAS PERFORMED  Lannah Koike ALorie Phenix10/08/2014, 2:39 PM

## 2015-04-25 ENCOUNTER — Inpatient Hospital Stay (HOSPITAL_COMMUNITY): Payer: Medicare Other | Admitting: Speech Pathology

## 2015-04-25 ENCOUNTER — Inpatient Hospital Stay (HOSPITAL_COMMUNITY): Payer: Medicare Other | Admitting: Physical Therapy

## 2015-04-25 ENCOUNTER — Inpatient Hospital Stay (HOSPITAL_COMMUNITY): Payer: Medicare Other | Admitting: Occupational Therapy

## 2015-04-25 ENCOUNTER — Inpatient Hospital Stay (HOSPITAL_COMMUNITY): Payer: Medicare Other

## 2015-04-25 DIAGNOSIS — E1142 Type 2 diabetes mellitus with diabetic polyneuropathy: Secondary | ICD-10-CM

## 2015-04-25 DIAGNOSIS — G8194 Hemiplegia, unspecified affecting left nondominant side: Secondary | ICD-10-CM

## 2015-04-25 LAB — GLUCOSE, CAPILLARY
GLUCOSE-CAPILLARY: 140 mg/dL — AB (ref 65–99)
GLUCOSE-CAPILLARY: 123 mg/dL — AB (ref 65–99)
GLUCOSE-CAPILLARY: 154 mg/dL — AB (ref 65–99)
Glucose-Capillary: 183 mg/dL — ABNORMAL HIGH (ref 65–99)

## 2015-04-25 NOTE — Progress Notes (Signed)
Patient ID: Nicole [name], female   DOB: [birth year], 81 y.o.   MRN: [id number]   Subjective/Complaints: No issues overnite except large volume incont this am Pt states "we are in [facility]"   ROS- Denies bowel, bladder breathing or pain issues, limited due to cognition  Objective: Vital Signs: Blood pressure 152/97, pulse 76, temperature 98.6 F (37 C), temperature source Oral, resp. rate 16, height _0  (1.753 m), weight 76 kg (167 lb 8.8 oz), SpO2 100 %. No results found. Results for orders placed or performed during the hospital encounter of 04/12/15 (from the past 72 hour(s))  Glucose, capillary     Status: Abnormal   Collection Time: 04/22/15 11:52 AM  Result Value Ref Range   Glucose-Capillary 194 (H) 65 - 99 mg/dL  Glucose, capillary     Status: Abnormal   Collection Time: 04/22/15  4:05 PM  Result Value Ref Range   Glucose-Capillary 183 (H) 65 - 99 mg/dL  Glucose, capillary     Status: Abnormal   Collection Time: 04/22/15  8:36 PM  Result Value Ref Range   Glucose-Capillary 180 (H) 65 - 99 mg/dL   Comment 1 Notify RN   Glucose, capillary     Status: Abnormal   Collection Time: 04/23/15  6:32 AM  Result Value Ref Range   Glucose-Capillary 141 (H) 65 - 99 mg/dL   Comment 1 Notify RN   Basic metabolic panel     Status: Abnormal   Collection Time: 04/23/15  7:20 AM  Result Value Ref Range   Sodium 134 (L) 135 - 145 mmol/L   Potassium 4.2 3.5 - 5.1 mmol/L   Chloride 103 101 - 111 mmol/L   CO2 24 22 - 32 mmol/L   Glucose, Bld 161 (H) 65 - 99 mg/dL   BUN 15 6 - 20 mg/dL   Creatinine, Ser 1.14 (H) 0.44 - 1.00 mg/dL   Calcium 8.9 8.9 - 10.3 mg/dL   GFR calc non Af Amer 44 (L) >60 mL/min   GFR calc Af Amer 50 (L) >60 mL/min    Comment: (NOTE) The eGFR has been calculated using the CKD EPI equation. This calculation has not been validated in all clinical situations. eGFR's persistently <60 mL/min signify possible Chronic Kidney Disease.    Anion gap 7 5 - 15   Glucose, capillary     Status: Abnormal   Collection Time: 04/23/15 11:29 AM  Result Value Ref Range   Glucose-Capillary 193 (H) 65 - 99 mg/dL  Glucose, capillary     Status: Abnormal   Collection Time: 04/23/15  4:41 PM  Result Value Ref Range   Glucose-Capillary 159 (H) 65 - 99 mg/dL  Glucose, capillary     Status: Abnormal   Collection Time: 04/23/15  8:49 PM  Result Value Ref Range   Glucose-Capillary 113 (H) 65 - 99 mg/dL   Comment 1 Notify RN   Glucose, capillary     Status: Abnormal   Collection Time: 04/24/15  6:30 AM  Result Value Ref Range   Glucose-Capillary 136 (H) 65 - 99 mg/dL   Comment 1 Notify RN   Glucose, capillary     Status: Abnormal   Collection Time: 04/24/15 11:23 AM  Result Value Ref Range   Glucose-Capillary 206 (H) 65 - 99 mg/dL  Glucose, capillary     Status: Abnormal   Collection Time: 04/24/15  4:32 PM  Result Value Ref Range   Glucose-Capillary 149 (H) 65 - 99 mg/dL  Glucose, capillary  Status: Abnormal   Collection Time: 04/24/15  9:02 PM  Result Value Ref Range   Glucose-Capillary 172 (H) 65 - 99 mg/dL  Glucose, capillary     Status: Abnormal   Collection Time: 04/25/15  6:58 AM  Result Value Ref Range   Glucose-Capillary 140 (H) 65 - 99 mg/dL     HEENT: normal Cardio: IRRR and diastolic murmur Resp: CTA B/L and unlabored GI: BS positive and non distended Extremity:  No Edema Skin:   Intact Neuro: pleasantly Confused, Flat, Cranial Nerve grossly II-XII normal, Abnormal Motor 3- Left delt bi tri 3 grip 3- L HF, 4- KE , 3- ankle DF, 5/5 on Right side and Abnormal [facility] Ataxic/ dec [facility] Musc/Skel:  Left shoulder pain with all movement incl ext rotation (slightly improved). ROM very restricted Gen NAD  Assessment/Plan: 1. Functional deficits secondary to right caudate intercerebral hemorrhage secondary to hypertensive crisis with left hemiparesis and cognitive deficits which require 3+ hours per day of interdisciplinary therapy in a  comprehensive inpatient rehab setting. Physiatrist is providing close team supervision and 24 hour management of active medical problems listed below. Physiatrist and rehab team continue to assess barriers to discharge/monitor patient progress toward functional and medical goals.   FIM: Function - Bathing Position: Shower Body parts bathed by patient: Right arm, Left arm, Chest, Abdomen, Front perineal area, Right upper leg, Left upper leg Body parts bathed by helper: Buttocks, Right lower leg, Left lower leg, Back Bathing not applicable: Front perineal area, Buttocks (pt has been cleansed prior to session by nursing) Assist Level: Touching or steadying assistance(Pt > 75%)  Function- Upper Body Dressing/Undressing What is the patient wearing?: Bra, Pull over shirt/dress Bra - Perfomed by patient: Thread/unthread right bra strap, Thread/unthread left bra strap, Hook/unhook bra (pull down sports bra) Bra - Perfomed by helper: Thread/unthread left bra strap Pull over shirt/dress - Perfomed by patient: Thread/unthread right sleeve, Thread/unthread left sleeve, Put head through opening, Pull shirt over trunk Pull over shirt/dress - Perfomed by helper: Pull shirt over trunk, Thread/unthread left sleeve Button up shirt - Perfomed by patient: Thread/unthread right sleeve Button up shirt - Perfomed by helper: Thread/unthread left sleeve, Pull shirt around back, Button/unbutton shirt Assist Level: Supervision or verbal cues, More than reasonable time Function - Lower Body Dressing/Undressing What is the patient wearing?: Shoes, AFO, Underwear, Pants Position: Standing at sink Underwear - Performed by patient: Pull underwear up/down Underwear - Performed by helper: Thread/unthread right underwear leg, Thread/unthread left underwear leg Pants- Performed by patient: Thread/unthread right pants leg, Thread/unthread left pants leg Pants- Performed by helper: Pull pants up/down Non-skid slipper socks-  Performed by helper: Don/doff right sock, Don/doff left sock Shoes - Performed by patient: Don/doff right shoe, Don/doff left shoe Shoes - Performed by helper: Fasten right, Fasten left TED Hose - Performed by helper: Don/doff right TED hose, Don/doff left TED hose Assist Level: Touching or steadying assistance (Pt > 75%)  Function - Toileting Toileting activity did not occur: No continent bowel/bladder event Toileting steps completed by patient: Performs perineal hygiene Toileting steps completed by helper: Adjust clothing prior to toileting, Performs perineal hygiene, Adjust clothing after toileting Toileting Assistive Devices: Grab bar or rail Assist level: Two helpers  Function - Air cabin crew transfer activity did not occur: Safety/medical concerns Toilet transfer assistive device: Bedside commode Assist level to toilet: Moderate assist (Pt 50 - 74%/lift or lower) Assist level from toilet: Moderate assist (Pt 50 - 74%/lift or lower) Assist level to bedside commode (at  bedside): 2 helpers Assist level from bedside commode (at bedside): 2 helpers  Function - Chair/bed transfer Chair/bed transfer [place]: Stand pivot Chair/bed transfer assist level: Touching or steadying assistance (Pt > 75%) Chair/bed transfer assistive device: Walker, Armrests Chair/bed transfer details: Verbal cues for sequencing, Verbal cues for safe use of DME/AE, Manual facilitation for weight shifting  Function - Locomotion: Wheelchair Will patient use wheelchair at discharge?: Yes Type: Manual Max wheelchair distance: 50 Assist Level: Moderate assistance (Pt 50 - 74%) Wheel 50 feet with 2 turns activity did not occur: Safety/medical concerns Assist Level: Moderate assistance (Pt 50 - 74%) Wheel 150 feet activity did not occur: Safety/medical concerns Assist Level: Dependent (Pt equals 0%) Turns around,maneuvers to table,bed, and toilet,negotiates 3% grade,maneuvers on rugs and over doorsills:  No Function - Locomotion: Ambulation Assistive device: Walker-rolling Max distance: 225 Assist level: Touching or steadying assistance (Pt > 75%) Assist level: Touching or steadying assistance (Pt > 75%) Assist level: Touching or steadying assistance (Pt > 75%) Walk 150 feet activity did not occur: Safety/medical concerns Assist level: Touching or steadying assistance (Pt > 75%) Assist level: Maximal assist (Pt 25 - 49%)  Function - Comprehension Comprehension: Auditory Comprehension assist level: Understands basic 50 - 74% of the time/ requires cueing 25 - 49% of the time  Function - Expression Expression: Verbal Expression assist level: Expresses basic 50 - 74% of the time/requires cueing 25 - 49% of the time. Needs to repeat parts of sentences.  Function - Social Interaction Social Interaction assist level: Interacts appropriately 75 - 89% of the time - Needs redirection for appropriate language or to initiate interaction.  Function - Problem Solving Problem solving assist level: Solves basic 25 - 49% of the time - needs direction more than half the time to initiate, plan or complete simple activities  Function - Memory Memory assist level: Recognizes or recalls 25 - 49% of the time/requires cueing 50 - 75% of the time Patient normally able to recall (first 3 days only): None of the above  Medical Problem List and Plan: 1. Functional deficits secondary to right caudate intercerebral hemorrhage secondary to hypertensive crisis 2. DVT Prophylaxis/Anticoagulation: Subcutaneous heparin added for DVT prophylaxis 04/08/2015, change to lovenox for improved efficacy and reduced needle sticks 3. 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 ?inject 4. Hypertension. Clonidine 0.2  mg 3 times a day, Cardizem 240 mg daily, changed to $RemoveBe'60mg'[id number]$  q6 on 10/1 due to the necessity of crushing medication, lisinopril 20 mg twice a day, Lopressor 25 mg twice a day. Will maintain BB dose; once intake improves will restart lasix, SYs BP >150 this am but generally in an acceptable range 5. Neuropsych: This patient is capable of making decisions on her own behalf. 6. Skin/Wound Care: Routine skin checks 7. Fluids/Electrolytes/Nutrition: Routine I&O with follow-up chemistries, meal intake 10-50%, Intake ~257ml fluid, IVF at noc, encourage daytime po intake 8. Systolic congestive heart failure. Check daily weights and I's and O's. Patient on Lasix 40 mg daily prior to admission. Resume as needed currently fluid intake very low , minimal pedal edema 9. Atrial fibrillation. Chronic Coumadin discontinued secondary to intracerebral hemorrhage. Cardiac rate controlled. Continue Cardizem and Lopressor 10. Diabetes mellitus peripheral neuropathy. Hemoglobin A1c 5.9. Sliding scale insulin. Check blood sugars before meals and at bedtime. Patient on Glucophage 500  mg twice a day prior to admission.   -initiated glucophage 233m bid but CBG dropped, will hold for now, appetite is variable  -  11. Chronic renal insufficiency. Baseline creatinine 1.35. 1.14 most recently--- cont to push po fluids.IVF at noc 12. Hypothyroidism. TSH level 2.053. Continue Synthroid 13. History of gout. Zyloprim 100 mg daily. Not active currently  14. Hyperlipidemia. Resume statin at discharge. 15. Hypokalemia- cont KCL now corrected. Recheck in am 16. Urinary incontinence: timed voids. ucx proteus S to Keflex 17. Likely due to IVF, mild, monitor  LOS (Days) 13 A FACE TO FACE EVALUATION WAS PERFORMED  KIRSTEINS,ANDREW E 04/25/2015, 7:05 AM

## 2015-04-25 NOTE — Progress Notes (Signed)
Physical Therapy Session Note  Patient Details  Name: Nicole [name] MRN: [id number] Date of Birth: [birth year]  Today's Date: 04/25/2015 PT Individual Time: 1100-1200 PT Individual Time Calculation (min): 60 min   Short Term Goals: Week 2:  PT Short Term Goal 1 (Week 2): Pt will demonstrate side lie to sit req min A.  PT Short Term Goal 2 (Week 2): Pt will demonstrate sit to stand req min A.  PT Short Term Goal 3 (Week 2): Pt will demonstrate sit to side lie req min A consistently.  PT Short Term Goal 4 (Week 2): Pt will consistently ambulate >= 150' with RW req min A.  PT Short Term Goal 5 (Week 2): Pt will ambulate up/down 3 steps with B rails req min A.   Skilled Therapeutic Interventions/Progress Updates:    Pt received seated in w/c; no c/o pain and agreeable to treatment. W/c propulsion x75' with modA and repetitive cues for sequencing, hand placement, and pt very confused by RLE leg rest being removed to A with propulsion even with frequent reminders that RLE can assist with propulsion. Gait with RW x10' and minA, however removed RW and utilized rail in hallway and HHA for remaining 40' to assist with natural gait pattern with patient unfamiliarity with RW. Very slow gait speed, and pt easily distracted requiring redirecting to task. Verbal cues for upright posture and larger step length especially LLE, with cues for step-through pattern. Stair training for one trial of 8 3-inch stairs with 2 handrails, step-to pattern and minA with verbal cues for sequencing and attention. Pt very fatigued after stairs and requires seated rest break. Car transfer with minA and verbal cues for sequencing and safety. Discussed with patient that her room would different when we returned, as she was switched to a new room. Remained seated in w/c with all needs in reach, QR belt intact; educated pt on use of call bell system for any needs and pt agreeable.   Therapy Documentation Precautions:   Precautions Precautions: Fall Precaution Comments: L hemiplegia Restrictions Weight Bearing Restrictions: No Pain: Pain Assessment Pain Assessment: No/denies pain Pain Score: 0-No pain Faces Pain Scale: Hurts little more Pain Type: Neuropathic pain Pain Location: Shoulder Pain Orientation: Left Pain Frequency: Intermittent Pain Onset: Unable to tell Patients Stated Pain Goal: 3 Pain Intervention(s): Shower;Distraction Multiple Pain Sites: No   See Function Navigator for Current Functional Status.   Therapy/Group: Individual Therapy  Luberta Mutter 04/25/2015, 12:11 PM

## 2015-04-25 NOTE — Progress Notes (Signed)
Occupational Therapy Session Note  Patient Details  Name: Nicole [name] MRN: [id number] Date of Birth: Feb 12, 1931  Today's Date: 04/25/2015 OT Individual Time: 0830-0930 OT Individual Time Calculation (min): 60 min    Short Term Goals: Week 2:  OT Short Term Goal 1 (Week 2): Pt will demo ability to lace left pant leg using AE with min assist OT Short Term Goal 2 (Week 2): Pt will demo ability to lace left shirt sleeve with vc to dress left first OT Short Term Goal 3 (Week 2): Pt will complete toilet hygiene standing supported with min assist for thoroughness OT Short Term Goal 4 (Week 2): Pt will groom (wash face, hands, brush teeth) standing at sink with sublte cues and setup assist.  Skilled Therapeutic Interventions/Progress Updates: ADL-retraining with focus on improved attention, memory, sequencing, orientation, and awareness, functional mobility, transfers, toileting and adapted bathing and dressing skills.  Pt received in w/c, disoriented to self but disoriented to day, month, time, place and situation.   Pt declared, "I'm having a baby; I'm about [redacted] weeks along."  With max multimodal cues pt was receptive to assisted self-care to include ambulating to toilet, toileting, bathing and dressing.   Pt able to rise from w/c with mod assist and ambulate using RW with min guard and mod vc to advance left leg.   Pt reported need for toileting and was assisted with clothing management however she voided small amount of urine while standing at RW close to toilet.   Pt then voided large BM and initiated toilet hygiene, reaching for paper and leaning to wipe from posterior approach (pt=80% thorough).   Pt then ambulated to shower with min assist to manage RW and hand-guidance to reach for grab bars and perform SPT to bench.   Pt bathed thoroughly in shower with setup and continuous vc to sequence and to manage hand shower and soap as pt bathed.   Pt recovered to w/c and dressed upper body with overall  min assist (bra and shirt) and lower body beside bed, using bed rail for support when rising to pull up brief and pants.   Pt left in w/c at end of sesion with call light within reach and QRB secured.     Therapy Documentation Precautions:  Precautions Precautions: Fall Precaution Comments: L hemiplegia Restrictions Weight Bearing Restrictions: No  Pain: Pain Assessment Pain Assessment: Faces Faces Pain Scale: Hurts little more Pain Type: Neuropathic pain Pain Location: Shoulder Pain Orientation: Left Pain Frequency: Intermittent Pain Onset: Unable to tell Patients Stated Pain Goal: 3 Pain Intervention(s): Shower;Distraction Multiple Pain Sites: No  See Function Navigator for Current Functional Status.   Therapy/Group: Individual Therapy  Jahree Dermody 04/25/2015, 10:47 AM

## 2015-04-25 NOTE — Progress Notes (Signed)
Speech Language Pathology Daily Session Note  Patient Details  Name: Nicole [name] MRN: [id number] Date of Birth: 08/31/1930  Today's Date: 04/25/2015 SLP Individual Time: 0932-1030 SLP Individual Time Calculation (min): 58 min  Short Term Goals: Week 2: SLP Short Term Goal 1 (Week 2): Patient will utilize external aids/cues for orientation to place, time and situation with Max A multimodal cues.  SLP Short Term Goal 2 (Week 2): Patient will demonstrate sustained attention to functional tasks for 5 minutes with Max A multimodal cues.  SLP Short Term Goal 3 (Week 2): Patient will identify 1 physical and 1 cognitive deficit with Max A multimodal cues.  SLP Short Term Goal 4 (Week 2): Patient will initiate functional tasks in 75% of opportunities with Max A multimodal cues.  SLP Short Term Goal 5 (Week 2): Patient will consume current diet with minimal overt s/s of aspiration with Max A multimodal cues for use of swallow strategies.   Skilled Therapeutic Interventions: Skilled treatment session focused on addressing cognitive goals. SLP facilitated session by providing Total assist faded to Max assist multimodal cues for orientation to time and place.  As well as, Max assist for sustained attention to a joint attention task for ~3 minutes.  Patient perseverated on and confabulated a story about a bird (bird image on calendar); patient required Max assist multimodal cues to change topic. Continue with current plan of care.    Function:  Cognition Comprehension Comprehension assist level: Understands basic 25 - 49% of the time/ requires cueing 50 - 75% of the time  Expression   Expression assist level: Expresses basic 50 - 74% of the time/requires cueing 25 - 49% of the time. Needs to repeat parts of sentences.  Social Interaction Social Interaction assist level: Interacts appropriately 75 - 89% of the time - Needs redirection for appropriate language or to initiate interaction.  Problem  Solving Problem solving assist level: Solves basic less than 25% of the time - needs direction nearly all the time or does not effectively solve problems and may need a restraint for safety  Memory Memory assist level: Recognizes or recalls less than 25% of the time/requires cueing greater than 75% of the time    Pain Pain Assessment Pain Assessment: No/denies pain Pain Score: 0-No pain  Therapy/Group: Individual Therapy  Carmelia Roller., CCC-SLP [phone]  [place] 04/25/2015, 12:51 PM

## 2015-04-25 NOTE — Progress Notes (Signed)
Occupational Therapy Session Note  Patient Details  Name: Nicole [name] MRN: [id number] Date of Birth: 02-19-1931  Today's Date: 04/25/2015 OT Individual Time: 1330-1400 OT Individual Time Calculation (min): 30 min    Short Term Goals: Week 2:  OT Short Term Goal 1 (Week 2): Pt will demo ability to lace left pant leg using AE with min assist OT Short Term Goal 2 (Week 2): Pt will demo ability to lace left shirt sleeve with vc to dress left first OT Short Term Goal 3 (Week 2): Pt will complete toilet hygiene standing supported with min assist for thoroughness OT Short Term Goal 4 (Week 2): Pt will groom (wash face, hands, brush teeth) standing at sink with sublte cues and setup assist.  Skilled Therapeutic Interventions/Progress Updates:    Pt seen for OT therapy session focusing on cognitive retraining specifically re-orientation. Pt in w/c upon arrival set-up with meal tray. She was disoriented to time and situation, requiring total A for reorientation to time, place, and situation despite given choice of 3, pt still unable to verbalize location or time of year. Orientation questions asked 5 times throughout 30 minute session and pt was unable to correctly answer time, place, or situation questions, required max VCs to utilize environmental assists including calander, and orientation sheet.  With encouragement, pt able to correctly sequence days of the week with increased time.    Pt left sitting in w/c at end of session, QRB donned and all needs in reach.   Therapy Documentation Precautions:  Precautions Precautions: Fall Precaution Comments: L hemiplegia Restrictions Weight Bearing Restrictions: No Pain:   No/ denies pain  See Function Navigator for Current Functional Status.   Therapy/Group: Individual Therapy  Lewis, Sotero Brinkmeyer C 04/25/2015, 7:24 AM

## 2015-04-26 ENCOUNTER — Inpatient Hospital Stay (HOSPITAL_COMMUNITY): Payer: Medicare Other | Admitting: Speech Pathology

## 2015-04-26 ENCOUNTER — Inpatient Hospital Stay (HOSPITAL_COMMUNITY): Payer: Medicare Other

## 2015-04-26 ENCOUNTER — Inpatient Hospital Stay (HOSPITAL_COMMUNITY): Payer: Medicare Other | Admitting: Physical Therapy

## 2015-04-26 LAB — GLUCOSE, CAPILLARY
GLUCOSE-CAPILLARY: 196 mg/dL — AB (ref 65–99)
Glucose-Capillary: 161 mg/dL — ABNORMAL HIGH (ref 65–99)
Glucose-Capillary: 124 mg/dL — ABNORMAL HIGH (ref 65–99)
Glucose-Capillary: 110 mg/dL — ABNORMAL HIGH (ref 65–99)

## 2015-04-26 NOTE — Progress Notes (Signed)
Physical Therapy Session Note  Patient Details  Name: Nicole [name] MRN: [id number] Date of Birth: Jan 01, 1931  Today's Date: 04/26/2015 PT Individual Time: 1430-1530 PT Individual Time Calculation (min): 60 min   Short Term Goals: Week 2:  PT Short Term Goal 1 (Week 2): Pt will demonstrate side lie to sit req min A.  PT Short Term Goal 2 (Week 2): Pt will demonstrate sit to stand req min A.  PT Short Term Goal 3 (Week 2): Pt will demonstrate sit to side lie req min A consistently.  PT Short Term Goal 4 (Week 2): Pt will consistently ambulate >= 150' with RW req min A.  PT Short Term Goal 5 (Week 2): Pt will ambulate up/down 3 steps with B rails req min A.   Skilled Therapeutic Interventions/Progress Updates:    Pt received seated in w/c with no c/o pain and agreeable to treatment. Performed sitting/standing dynamic balance activity with pt bowling. Sit <>stand x10 trials throughout activity, with prolonged seated rest breaks between during which time pt attempted to bowl sitting down. Sitting balance, including reaching toward floor to roll ball from floor position performed with standbyA; standing balance with CGA and verbal cues for sequencing before sitting down for safety and hand placement. Gait training for 2 trials of 74' each with RW and CGA. Requires cues for longer step length, increasing speed. Also requires cues for attention to task, easily distracted by surroundings. Pt returned to room with totalA and remained seated in w/c with QR belt intact and RN present at completion of session, all needs within reach.   Therapy Documentation Precautions:  Precautions Precautions: Fall Precaution Comments: L hemiplegia Restrictions Weight Bearing Restrictions: No Pain: Pain Assessment Pain Assessment: No/denies pain Pain Score: 0-No pain   See Function Navigator for Current Functional Status.   Therapy/Group: Individual Therapy  Luberta Mutter 04/26/2015, 3:40 PM

## 2015-04-26 NOTE — Progress Notes (Signed)
Occupational Therapy Session Note  Patient Details  Name: Nicole [name] MRN: [id number] Date of Birth: 11-05-1930  Today's Date: 04/26/2015 OT Individual Time: 0730-0900 OT Individual Time Calculation (min): 90 min    Short Term Goals: Week 2:  OT Short Term Goal 1 (Week 2): Pt will demo ability to lace left pant leg using AE with min assist OT Short Term Goal 2 (Week 2): Pt will demo ability to lace left shirt sleeve with vc to dress left first OT Short Term Goal 3 (Week 2): Pt will complete toilet hygiene standing supported with min assist for thoroughness OT Short Term Goal 4 (Week 2): Pt will groom (wash face, hands, brush teeth) standing at sink with sublte cues and setup assist.  Skilled Therapeutic Interventions/Progress Updates: ADL-retraining with focus on developing compensatory strategies for cognitive impairment to facilitate performance of BADL with reduced burden of care.   Pt received supine in bed, alert but disoriented to place, day, time or situation.  With max multimodal cues and extra time, pt rises to sit at EOB and then transfers to w/c with mod assist (pt=80% sit>stand).   Pt requires constant cues during self-feeding and redirection to progress through meal correctly.  Pt misuses spoon to scoop from milk carton and is noted with improved performance when food items are presented one at a time versus arriving combined on one tray.   Pt completes self-feeding and agrees to bathing after much discussion and redirection to recall benefits of daily baths. Pt toilets with mod assist (passing urine) and ambulates to shower to bathe seated on tub bench, standing to wash her buttocks with only steadying assist this date.    Pt requires extra time and intervention to inhibit perseveration on body parts as pt washes her underarms 4 different times this date.    Pt recovers to w/c and dresses upper body with overall mod assist and lower body with overall max assist.   Pt escorted to  sink to groom in w/c; RN tech alerted to need for continued supervision with grooming at end of session.     Therapy Documentation Precautions:  Precautions Precautions: Fall Precaution Comments: L hemiplegia Restrictions Weight Bearing Restrictions: No  Vital Signs: Therapy Vitals Pulse Rate: 76 Resp: 18 BP: (!) 159/95 mmHg Patient Position (if appropriate): Sitting   Pain: Pain Assessment Pain Assessment: No/denies pain  See Function Navigator for Current Functional Status.   Therapy/Group: Individual Therapy  Hania Cerone 04/26/2015, 10:53 AM

## 2015-04-26 NOTE — Progress Notes (Signed)
Speech Language Pathology Daily Session Note  Patient Details  Name: Nicole [name] MRN: [id number] Date of Birth: [birth year]  Today's Date: 04/26/2015 SLP Individual Time: 1003-1100 SLP Individual Time Calculation (min): 57 min  Short Term Goals: Week 2: SLP Short Term Goal 1 (Week 2): Patient will utilize external aids/cues for orientation to place, time and situation with Max A multimodal cues.  SLP Short Term Goal 2 (Week 2): Patient will demonstrate sustained attention to functional tasks for 5 minutes with Max A multimodal cues.  SLP Short Term Goal 3 (Week 2): Patient will identify 1 physical and 1 cognitive deficit with Max A multimodal cues.  SLP Short Term Goal 4 (Week 2): Patient will initiate functional tasks in 75% of opportunities with Max A multimodal cues.  SLP Short Term Goal 5 (Week 2): Patient will consume current diet with minimal overt s/s of aspiration with Max A multimodal cues for use of swallow strategies.   Skilled Therapeutic Interventions: Skilled treatment session focused on addressing cognition goals. SLP facilitated session by providing Total assist for initial orientation and despite Max assist multimodal cues to utilize external aids continued to require frequent re-orientation.  Patient initially requesting keys to her car so she could leave, but was able to be redirected to a basic flower arranging task, which focused on sustained attention and initiation of 1-step directions.  Patient was able to sustain attention for ~1 minute with Max multimodal cues and initiated 1-step directions in 90% of opportunities.  Patient was not always accurate in following out the directions, due to attention and working memory impairments and would then require cues to demonstrate timely cessation of task. Continue with current plan of care.    Function:  Cognition Comprehension Comprehension assist level: Understands basic less than 25% of the time/ requires cueing >75% of  the time  Expression   Expression assist level: Expresses basic 50 - 74% of the time/requires cueing 25 - 49% of the time. Needs to repeat parts of sentences.  Social Interaction Social Interaction assist level: Interacts appropriately 25 - 49% of time - Needs frequent redirection.  Problem Solving Problem solving assist level: Solves basic less than 25% of the time - needs direction nearly all the time or does not effectively solve problems and may need a restraint for safety  Memory Memory assist level: Recognizes or recalls less than 25% of the time/requires cueing greater than 75% of the time    Pain Pain Assessment Pain Assessment: No/denies pain  Therapy/Group: Individual Therapy  Carmelia Roller., [place] [phone]  [place] 04/26/2015, 12:36 PM

## 2015-04-26 NOTE — Progress Notes (Signed)
Patient ID: Nicole [name], female   DOB: Sep 16, 1930, 81 y.o.   MRN: [id number]   Subjective/Complaints: Pt is without new issues overnite, remain confused   ROS- Denies bowel, bladder breathing or pain issues, limited due to cognition  Objective: Vital Signs: Blood pressure 138/72, pulse 66, temperature 97.7 F (36.5 C), temperature source Oral, resp. rate 16, height _0  (1.753 m), weight 76 kg (167 lb 8.8 oz), SpO2 100 %. No results found. Results for orders placed or performed during the hospital encounter of 04/12/15 (from the past 72 hour(s))  Basic metabolic panel     Status: Abnormal   Collection Time: 04/23/15  7:20 AM  Result Value Ref Range   Sodium 134 (L) 135 - 145 mmol/L   Potassium 4.2 3.5 - 5.1 mmol/L   Chloride 103 101 - 111 mmol/L   CO2 24 22 - 32 mmol/L   Glucose, Bld 161 (H) 65 - 99 mg/dL   BUN 15 6 - 20 mg/dL   Creatinine, Ser 1.14 (H) 0.44 - 1.00 mg/dL   Calcium 8.9 8.9 - 10.3 mg/dL   GFR calc non Af Amer 44 (L) >60 mL/min   GFR calc Af Amer 50 (L) >60 mL/min    Comment: (NOTE) The eGFR has been calculated using the CKD EPI equation. This calculation has not been validated in all clinical situations. eGFR's persistently <60 mL/min signify possible Chronic Kidney Disease.    Anion gap 7 5 - 15  Glucose, capillary     Status: Abnormal   Collection Time: 04/23/15 11:29 AM  Result Value Ref Range   Glucose-Capillary 193 (H) 65 - 99 mg/dL  Glucose, capillary     Status: Abnormal   Collection Time: 04/23/15  4:41 PM  Result Value Ref Range   Glucose-Capillary 159 (H) 65 - 99 mg/dL  Glucose, capillary     Status: Abnormal   Collection Time: 04/23/15  8:49 PM  Result Value Ref Range   Glucose-Capillary 113 (H) 65 - 99 mg/dL   Comment 1 Notify RN   Glucose, capillary     Status: Abnormal   Collection Time: 04/24/15  6:30 AM  Result Value Ref Range   Glucose-Capillary 136 (H) 65 - 99 mg/dL   Comment 1 Notify RN   Glucose, capillary     Status:  Abnormal   Collection Time: 04/24/15 11:23 AM  Result Value Ref Range   Glucose-Capillary 206 (H) 65 - 99 mg/dL  Glucose, capillary     Status: Abnormal   Collection Time: 04/24/15  4:32 PM  Result Value Ref Range   Glucose-Capillary 149 (H) 65 - 99 mg/dL  Glucose, capillary     Status: Abnormal   Collection Time: 04/24/15  9:02 PM  Result Value Ref Range   Glucose-Capillary 172 (H) 65 - 99 mg/dL  Glucose, capillary     Status: Abnormal   Collection Time: 04/25/15  6:58 AM  Result Value Ref Range   Glucose-Capillary 140 (H) 65 - 99 mg/dL  Glucose, capillary     Status: Abnormal   Collection Time: 04/25/15 12:04 PM  Result Value Ref Range   Glucose-Capillary 183 (H) 65 - 99 mg/dL  Glucose, capillary     Status: Abnormal   Collection Time: 04/25/15  4:24 PM  Result Value Ref Range   Glucose-Capillary 123 (H) 65 - 99 mg/dL  Glucose, capillary     Status: Abnormal   Collection Time: 04/25/15  9:09 PM  Result Value Ref Range   Glucose-Capillary 154 (  H) 65 - 99 mg/dL  Glucose, capillary     Status: Abnormal   Collection Time: 04/26/15  6:38 AM  Result Value Ref Range   Glucose-Capillary 161 (H) 65 - 99 mg/dL     HEENT: normal Cardio: IRRR and diastolic murmur Resp: CTA B/L and unlabored GI: BS positive and non distended Extremity:  No Edema Skin:   Intact Neuro: pleasantly Confused, Flat, Cranial Nerve grossly II-XII normal, Abnormal Motor 3- Left delt bi tri 3 grip 3- L HF, 4- KE , 3- ankle DF, 5/5 on Right side and Abnormal [facility] Ataxic/ dec [facility] Musc/Skel:  Left shoulder pain with all movement incl ext rotation (slightly improved). ROM very restricted Gen NAD  Assessment/Plan: 1. Functional deficits secondary to right caudate intercerebral hemorrhage secondary to hypertensive crisis with left hemiparesis and cognitive deficits which require 3+ hours per day of interdisciplinary therapy in a comprehensive inpatient rehab setting. Physiatrist is providing close team supervision  and 24 hour management of active medical problems listed below. Physiatrist and rehab team continue to assess barriers to discharge/monitor patient progress toward functional and medical goals.   FIM: Function - Bathing Position: Shower Body parts bathed by patient: Right arm, Left arm, Chest, Abdomen, Front perineal area, Right upper leg, Left upper leg, Right lower leg Body parts bathed by helper: Back, Left lower leg, Buttocks Bathing not applicable: Front perineal area, Buttocks (pt has been cleansed prior to session by nursing) Assist Level: Touching or steadying assistance(Pt > 75%), Supervision or verbal cues  Function- Upper Body Dressing/Undressing What is the patient wearing?: Bra, Pull over shirt/dress Bra - Perfomed by patient: Thread/unthread right bra strap, Thread/unthread left bra strap, Hook/unhook bra (pull down sports bra) Bra - Perfomed by helper: Thread/unthread left bra strap Pull over shirt/dress - Perfomed by patient: Thread/unthread right sleeve, Thread/unthread left sleeve Pull over shirt/dress - Perfomed by helper: Put head through opening, Pull shirt over trunk Button up shirt - Perfomed by patient: Thread/unthread right sleeve Button up shirt - Perfomed by helper: Thread/unthread left sleeve, Pull shirt around back, Button/unbutton shirt Assist Level: Touching or steadying assistance(Pt > 75%), Supervision or verbal cues Function - Lower Body Dressing/Undressing What is the patient wearing?: Pants, Maryln Manuel, Shoes Position: Education officer, museum at [employer] - Performed by patient: Thread/unthread right underwear leg Underwear - Performed by helper: Thread/unthread left underwear leg, Pull underwear up/down Pants- Performed by patient: Thread/unthread right pants leg, Pull pants up/down Pants- Performed by helper: Thread/unthread left pants leg Non-skid slipper socks- Performed by helper: Don/doff right sock, Don/doff left sock Shoes - Performed by patient:  Don/doff right shoe, Don/doff left shoe Shoes - Performed by helper: Don/doff right shoe, Don/doff left shoe, Fasten right, Fasten left TED Hose - Performed by helper: Don/doff right TED hose, Don/doff left TED hose Assist Level: Touching or steadying assistance (Pt > 75%)  Function - Toileting Toileting activity did not occur: No continent bowel/bladder event Toileting steps completed by patient: Performs perineal hygiene Toileting steps completed by helper: Adjust clothing prior to toileting, Performs perineal hygiene, Adjust clothing after toileting Toileting Assistive Devices: Grab bar or rail Assist level: Supervision or verbal cues, Touching or steadying assistance (Pt.75%)  Function - Air cabin crew transfer activity did not occur: Safety/medical concerns Toilet transfer assistive device: Elevated toilet seat/BSC over toilet, Walker Assist level to toilet: Moderate assist (Pt 50 - 74%/lift or lower) Assist level from toilet: Moderate assist (Pt 50 - 74%/lift or lower) Assist level to bedside commode (at bedside): Touching or  steadying assistance (Pt > 75%) Assist level from bedside commode (at bedside): Moderate assist (Pt 50 - 74%/lift or lower)  Function - Chair/bed transfer Chair/bed transfer method: Stand pivot Chair/bed transfer assist level: Touching or steadying assistance (Pt > 75%) Chair/bed transfer assistive device: Armrests Chair/bed transfer details: Verbal cues for sequencing, Verbal cues for safe use of DME/AE, Manual facilitation for weight shifting, Tactile cues for posture  Function - Locomotion: Wheelchair Will patient use wheelchair at discharge?: Yes Type: Manual Max wheelchair distance: 75 Assist Level: Moderate assistance (Pt 50 - 74%) Wheel 50 feet with 2 turns activity did not occur: Safety/medical concerns Assist Level: Moderate assistance (Pt 50 - 74%) Wheel 150 feet activity did not occur: Safety/medical concerns Assist Level: Dependent  (Pt equals 0%) Turns around,maneuvers to table,bed, and toilet,negotiates 3% grade,maneuvers on rugs and over doorsills: No Function - Locomotion: Ambulation Assistive device: Rail in hallway Max distance: 50 Assist level: Touching or steadying assistance (Pt > 75%) Assist level: Touching or steadying assistance (Pt > 75%) Assist level: Touching or steadying assistance (Pt > 75%) Walk 150 feet activity did not occur: Safety/medical concerns Assist level: Touching or steadying assistance (Pt > 75%) Assist level: Maximal assist (Pt 25 - 49%)  Function - Comprehension Comprehension: Auditory Comprehension assist level: Understands basic 25 - 49% of the time/ requires cueing 50 - 75% of the time  Function - Expression Expression: Verbal Expression assist level: Expresses basic 50 - 74% of the time/requires cueing 25 - 49% of the time. Needs to repeat parts of sentences.  Function - Social Interaction Social Interaction assist level: Interacts appropriately 75 - 89% of the time - Needs redirection for appropriate language or to initiate interaction.  Function - Problem Solving Problem solving assist level: Solves basic less than 25% of the time - needs direction nearly all the time or does not effectively solve problems and may need a restraint for safety  Function - Memory Memory assist level: Recognizes or recalls less than 25% of the time/requires cueing greater than 75% of the time Patient normally able to recall (first 3 days only): None of the above  Medical Problem List and Plan: 1. Functional deficits secondary to right caudate intercerebral hemorrhage secondary to hypertensive crisis 2. DVT Prophylaxis/Anticoagulation: Subcutaneous heparin added for DVT prophylaxis 04/08/2015, change to lovenox for improved efficacy and reduced needle sticks 3. Pain Management: Tylenol as needed, post stroke shoulder pain, limited ROM, developing spasticity, start with local measures, aggressive  PT/OT, tramadol for pain, Left AC and GH OA noted on Xray, position Left arm in external rotation  -heat, sports cream. ?inject 4. Hypertension. Clonidine 0.2 mg 3 times a day, Cardizem 240 mg daily, changed to 17m q6 on 10/1 due to the necessity of crushing medication, lisinopril 20 mg twice a day, Lopressor 25 mg twice a day. Will maintain BB dose; once intake improves will restart lasix, SYs BP >150 this am but generally in an acceptable range 5. Neuropsych: This patient is capable of making decisions on her own behalf. 6. Skin/Wound Care: Routine skin checks 7. Fluids/Electrolytes/Nutrition: Routine I&O with follow-up chemistries, meal intake 10-50%, Intake ~4862mfluid, IVF at noc, encourage daytime po intake 8. Systolic congestive heart failure. Check daily weights and I's and O's. Patient on Lasix 40 mg daily prior to admission. Resume as needed currently fluid intake very low , minimal pedal edema 9. Atrial fibrillation. Chronic Coumadin discontinued secondary to intracerebral hemorrhage. Cardiac rate controlled. Continue Cardizem and Lopressor 10. Diabetes mellitus peripheral neuropathy.  Hemoglobin A1c 5.9. Sliding scale insulin. Check blood sugars before meals and at bedtime. Patient on Glucophage 500 mg twice a day prior to admission.   -initiated glucophage 220m bid but CBG dropped, will hold for now, appetite is variable  -  11. Chronic renal insufficiency. Baseline creatinine 1.35. 1.14 most recently--- cont to push po fluids.IVF at noc, BMET in am 12. Hypothyroidism. TSH level 2.053. Continue Synthroid 13. History of gout. Zyloprim 100 mg daily. Not active currently  14. Hyperlipidemia. Resume statin at discharge.  16. Urinary incontinence: timed voids. ucx proteus S to Keflex 17. Hyponatremia Likely due to IVF, mild, recheck BMET in am  LOS (Days) 14 A FACE TO FACE EVALUATION WAS PERFORMED  Nicole [name] 04/26/2015, 7:08 AM

## 2015-04-27 ENCOUNTER — Inpatient Hospital Stay (HOSPITAL_COMMUNITY): Payer: Medicare Other | Admitting: Physical Therapy

## 2015-04-27 ENCOUNTER — Inpatient Hospital Stay (HOSPITAL_COMMUNITY): Payer: Medicare Other | Admitting: Occupational Therapy

## 2015-04-27 ENCOUNTER — Inpatient Hospital Stay (HOSPITAL_COMMUNITY): Payer: Medicare Other | Admitting: Speech Pathology

## 2015-04-27 LAB — BASIC METABOLIC PANEL
ANION GAP: 8 (ref 5–15)
BUN: 8 mg/dL (ref 6–20)
CO2: 26 mmol/L (ref 22–32)
Calcium: 9.1 mg/dL (ref 8.9–10.3)
Chloride: 101 mmol/L (ref 101–111)
Creatinine, Ser: 1.13 mg/dL — ABNORMAL HIGH (ref 0.44–1.00)
GFR calc Af Amer: 51 mL/min — ABNORMAL LOW (ref >60)
GFR calc non Af Amer: 44 mL/min — ABNORMAL LOW (ref >60)
GLUCOSE: 240 mg/dL — AB (ref 65–99)
POTASSIUM: 4.3 mmol/L (ref 3.5–5.1)
Sodium: 135 mmol/L (ref 135–145)

## 2015-04-27 LAB — GLUCOSE, CAPILLARY
GLUCOSE-CAPILLARY: 120 mg/dL — AB (ref 65–99)
Glucose-Capillary: 157 mg/dL — ABNORMAL HIGH (ref 65–99)
Glucose-Capillary: 221 mg/dL — ABNORMAL HIGH (ref 65–99)
Glucose-Capillary: 99 mg/dL (ref 65–99)

## 2015-04-27 NOTE — Progress Notes (Signed)
Patient ID: Nicole [name], female   DOB: [birth year], 81 y.o.   MRN: [id number]   Subjective/Complaints: Pt had BM on toilet , asked SLP to take her.  Still needs 2 person assist   ROS- Denies bowel, bladder breathing or pain issues, limited due to cognition  Objective: Vital Signs: Blood pressure 142/86, pulse 74, temperature 97.8 F (36.6 C), temperature source Oral, resp. rate 18, height 5\' 9"  (1.753 m), weight 78 kg (171 lb 15.3 oz), SpO2 95 %. No results found. Results for orders placed or performed during the hospital encounter of 04/12/15 (from the past 72 hour(s))  Glucose, capillary     Status: Abnormal   Collection Time: 04/24/15 11:23 AM  Result Value Ref Range   Glucose-Capillary 206 (H) 65 - 99 mg/dL  Glucose, capillary     Status: Abnormal   Collection Time: 04/24/15  4:32 PM  Result Value Ref Range   Glucose-Capillary 149 (H) 65 - 99 mg/dL  Glucose, capillary     Status: Abnormal   Collection Time: 04/24/15  9:02 PM  Result Value Ref Range   Glucose-Capillary 172 (H) 65 - 99 mg/dL  Glucose, capillary     Status: Abnormal   Collection Time: 04/25/15  6:58 AM  Result Value Ref Range   Glucose-Capillary 140 (H) 65 - 99 mg/dL  Glucose, capillary     Status: Abnormal   Collection Time: 04/25/15 12:04 PM  Result Value Ref Range   Glucose-Capillary 183 (H) 65 - 99 mg/dL  Glucose, capillary     Status: Abnormal   Collection Time: 04/25/15  4:24 PM  Result Value Ref Range   Glucose-Capillary 123 (H) 65 - 99 mg/dL  Glucose, capillary     Status: Abnormal   Collection Time: 04/25/15  9:09 PM  Result Value Ref Range   Glucose-Capillary 154 (H) 65 - 99 mg/dL  Glucose, capillary     Status: Abnormal   Collection Time: 04/26/15  6:38 AM  Result Value Ref Range   Glucose-Capillary 161 (H) 65 - 99 mg/dL  Glucose, capillary     Status: Abnormal   Collection Time: 04/26/15 12:00 PM  Result Value Ref Range   Glucose-Capillary 196 (H) 65 - 99 mg/dL  Glucose, capillary      Status: Abnormal   Collection Time: 04/26/15  5:02 PM  Result Value Ref Range   Glucose-Capillary 124 (H) 65 - 99 mg/dL  Glucose, capillary     Status: Abnormal   Collection Time: 04/26/15  8:45 PM  Result Value Ref Range   Glucose-Capillary 110 (H) 65 - 99 mg/dL  Glucose, capillary     Status: Abnormal   Collection Time: 04/27/15  6:48 AM  Result Value Ref Range   Glucose-Capillary 157 (H) 65 - 99 mg/dL     HEENT: normal Cardio: IRRR and diastolic murmur Resp: CTA B/L and unlabored GI: BS positive and non distended Extremity:  No Edema Skin:   Intact Neuro: pleasantly Confused, Flat, Cranial Nerve grossly II-XII normal, Abnormal Motor 3- Left delt bi tri 3 grip 3- L HF, 4- KE , 3- ankle DF, 5/5 on Right side and Abnormal [facility] Ataxic/ dec [facility] Musc/Skel:  Left shoulder pain with all movement incl ext rotation (slightly improved). ROM very restricted Gen NAD  Assessment/Plan: 1. Functional deficits secondary to right caudate intercerebral hemorrhage secondary to hypertensive crisis with left hemiparesis and cognitive deficits which require 3+ hours per day of interdisciplinary therapy in a comprehensive inpatient rehab setting. Physiatrist is providing close  team supervision and 24 hour management of active medical problems listed below. Physiatrist and rehab team continue to assess barriers to discharge/monitor patient progress toward functional and medical goals.   FIM: Function - Bathing Position: Shower Body parts bathed by patient: Right arm, Left arm, Chest, Abdomen, Front perineal area, Buttocks, Right upper leg, Right lower leg, Left upper leg Body parts bathed by helper: Left lower leg, Back Bathing not applicable: Front perineal area, Buttocks (pt has been cleansed prior to session by nursing) Assist Level: Touching or steadying assistance(Pt > 75%)  Function- Upper Body Dressing/Undressing What is the patient wearing?: Bra, Pull over shirt/dress Bra - Perfomed by  patient: Thread/unthread right bra strap, Thread/unthread left bra strap Bra - Perfomed by helper: Hook/unhook bra (pull down sports bra) Pull over shirt/dress - Perfomed by patient: Thread/unthread right sleeve, Thread/unthread left sleeve Pull over shirt/dress - Perfomed by helper: Put head through opening, Pull shirt over trunk Button up shirt - Perfomed by patient: Thread/unthread right sleeve Button up shirt - Perfomed by helper: Thread/unthread left sleeve, Pull shirt around back, Button/unbutton shirt Assist Level: Touching or steadying assistance(Pt > 75%) Function - Lower Body Dressing/Undressing What is the patient wearing?: Pants, Shoes, AFO Position: Wheelchair/chair at sink Underwear - Performed by patient: Thread/unthread right underwear leg Underwear - Performed by helper: Thread/unthread right underwear leg, Thread/unthread left underwear leg, Pull underwear up/down (Brief worn) Pants- Performed by patient: Thread/unthread right pants leg, Pull pants up/down Pants- Performed by helper: Thread/unthread left pants leg Non-skid slipper socks- Performed by helper: Don/doff right sock, Don/doff left sock Shoes - Performed by patient: Don/doff right shoe, Don/doff left shoe Shoes - Performed by helper: Don/doff right shoe, Don/doff left shoe, Fasten right, Fasten left TED Hose - Performed by helper: Don/doff right TED hose, Don/doff left TED hose Assist Level: Touching or steadying assistance (Pt > 75%)  Function - Toileting Toileting activity did not occur: No continent bowel/bladder event Toileting steps completed by patient: Adjust clothing prior to toileting Toileting steps completed by helper: Adjust clothing after toileting, Adjust clothing prior to toileting Toileting Assistive Devices: Grab bar or rail Assist level: More than reasonable time, Touching or steadying assistance (Pt.75%)  Function - Air cabin crew transfer activity did not occur: Safety/medical  concerns Toilet transfer assistive device: Elevated toilet seat/BSC over toilet Assist level to toilet: Moderate assist (Pt 50 - 74%/lift or lower) Assist level from toilet: Moderate assist (Pt 50 - 74%/lift or lower) Assist level to bedside commode (at bedside): Touching or steadying assistance (Pt > 75%) Assist level from bedside commode (at bedside): Moderate assist (Pt 50 - 74%/lift or lower)  Function - Chair/bed transfer Chair/bed transfer method: Stand pivot Chair/bed transfer assist level: Touching or steadying assistance (Pt > 75%) Chair/bed transfer assistive device: Armrests, Walker Chair/bed transfer details: Verbal cues for sequencing, Verbal cues for safe use of DME/AE, Manual facilitation for weight shifting, Tactile cues for posture  Function - Locomotion: Wheelchair Will patient use wheelchair at discharge?: Yes Type: Manual Max wheelchair distance: 75 Assist Level: Moderate assistance (Pt 50 - 74%) Wheel 50 feet with 2 turns activity did not occur: Safety/medical concerns Assist Level: Moderate assistance (Pt 50 - 74%) Wheel 150 feet activity did not occur: Safety/medical concerns Assist Level: Dependent (Pt equals 0%) Turns around,maneuvers to table,bed, and toilet,negotiates 3% grade,maneuvers on rugs and over doorsills: No Function - Locomotion: Ambulation Assistive device: Walker-rolling Max distance: 50 Assist level: Touching or steadying assistance (Pt > 75%) Assist level: Touching or steadying assistance (  Pt > 75%) Assist level: Touching or steadying assistance (Pt > 75%) Walk 150 feet activity did not occur: Safety/medical concerns Assist level: Touching or steadying assistance (Pt > 75%) Assist level: Maximal assist (Pt 25 - 49%)  Function - Comprehension Comprehension: Auditory Comprehension assist level: Understands basic less than 25% of the time/ requires cueing >75% of the time  Function - Expression Expression: Verbal Expression assist level:  Expresses basic 25 - 49% of the time/requires cueing 50 - 75% of the time. Uses single words/gestures.  Function - Social Interaction Social Interaction assist level: Interacts appropriately less than 25% of the time. May be withdrawn or combative.  Function - Problem Solving Problem solving assist level: Solves basic less than 25% of the time - needs direction nearly all the time or does not effectively solve problems and may need a restraint for safety  Function - Memory Memory assist level: Recognizes or recalls less than 25% of the time/requires cueing greater than 75% of the time Patient normally able to recall (first 3 days only): None of the above  Medical Problem List and Plan: 1. Functional deficits secondary to right caudate intercerebral hemorrhage secondary to hypertensive crisis 2. DVT Prophylaxis/Anticoagulation: Subcutaneous heparin added for DVT prophylaxis 04/08/2015, change to lovenox for improved efficacy and reduced needle sticks 3. 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 ?inject 4. Hypertension. Clonidine 0.2 mg 3 times a day, Cardizem 240 mg daily, changed to 60mg  q6 on 10/1 due to the necessity of crushing medication, lisinopril 20 mg twice a day, Lopressor 25 mg twice a day. Will maintain BB dose; once intake improves will restart lasix, SYs BP >150 this am but generally in an acceptable range 5. Neuropsych: This patient is capable of making decisions on her own behalf. 6. Skin/Wound Care: Routine skin checks 7. Fluids/Electrolytes/Nutrition: Routine I&O with follow-up chemistries, meal intake 30-50%, Intake ~219ml fluid, IVF at noc, encourage daytime po intake 8. Systolic congestive heart failure. Check daily weights and I's and O's. Patient on Lasix 40 mg  daily prior to admission. Resume as needed currently fluid intake very low , minimal pedal edema 9. Atrial fibrillation. Chronic Coumadin discontinued secondary to intracerebral hemorrhage. Cardiac rate controlled. Continue Cardizem and Lopressor 10. Diabetes mellitus peripheral neuropathy. Hemoglobin A1c 5.9. Sliding scale insulin. Check blood sugars before meals and at bedtime. Patient on Glucophage 500 mg twice a day prior to admission.   -initiated glucophage 250mg  bid but CBG dropped, will hold for now, appetite is variable  -  11. Chronic renal insufficiency. Baseline creatinine 1.35. 1.14 most recently--- cont to push po fluids.IVF at noc,  12. Hypothyroidism. TSH level 2.053. Continue Synthroid 13. History of gout. Zyloprim 100 mg daily. Not active currently  14. Hyperlipidemia. Resume statin at discharge.  16. Urinary incontinence: timed voids. ucx proteus S to Keflex, finished treatment 17. Hyponatremia Likely due to IVF, mild, recheck BMET today  LOS (Days) [place] 04/27/2015, 8:02 AM

## 2015-04-27 NOTE — Progress Notes (Signed)
Speech Language Pathology Weekly Progress and Session Note  Patient Details  Name: Nicole [name] MRN: [id number] Date of Birth: 08/25/1930  Beginning of progress report period: April 21, 2015 End of progress report period: April 27, 2015  Today's Date: 04/27/2015 SLP Individual Time: 0800-0900 SLP Individual Time Calculation (min): 60 min  Short Term Goals: Week 2: SLP Short Term Goal 1 (Week 2): Patient will utilize external aids/cues for orientation to place, time and situation with Max A multimodal cues.  SLP Short Term Goal 1 - Progress (Week 2): Progressing toward goal SLP Short Term Goal 2 (Week 2): Patient will demonstrate sustained attention to functional tasks for 5 minutes with Max A multimodal cues.  SLP Short Term Goal 2 - Progress (Week 2): Met SLP Short Term Goal 3 (Week 2): Patient will identify 1 physical and 1 cognitive deficit with Max A multimodal cues.  SLP Short Term Goal 3 - Progress (Week 2): Discontinued (comment) (lack of progress) SLP Short Term Goal 4 (Week 2): Patient will initiate functional tasks in 75% of opportunities with Max A multimodal cues.  SLP Short Term Goal 4 - Progress (Week 2): Met SLP Short Term Goal 5 (Week 2): Patient will consume current diet with minimal overt s/s of aspiration with Max A multimodal cues for use of swallow strategies.  SLP Short Term Goal 5 - Progress (Week 2): Met    New Short Term Goals: Week 3: SLP Short Term Goal 1 (Week 3): Patient will utilize external aids/cues for orientation to place, time and situation with Max A multimodal cues.  SLP Short Term Goal 2 (Week 3): Patient will demonstrate sustained attention to functional tasks for 8 minutes with Max A multimodal cues.  SLP Short Term Goal 3 (Week 3): Patient will follow 1-step directions in 75% of opportunities with Max A multimodal cues.  SLP Short Term Goal 4 (Week 3): Patient will consume current diet with minimal overt s/s of aspiration with Mod A  multimodal cues for use of swallow strategies.  SLP Short Term Goal 5 (Week 3): Patient will problem solve use of call bell to request help with Max A multimodal cues.  Weekly Progress Updates: Patient has made some gains and has met 3 out of 5 short term goals this reporting period. Currently, patient is consuming Dys. 1 textures with thin liquids with intermittent overt s/s of aspiration and requires Max A for use of swallowing compensatory strategies, she has demonstrated improvements in initiation as well as ability to sustain attention in controlled environments.  Patient also requires overall Max-Total A for orientation (which fluctuates), Total A for intellectual awareness, functional problem solving, and overall safety. Patient and family education is ongoing and due to severity of deficits follow up plan had changed to SNF. Patient would benefit from continued skilled SLP intervention to maximize swallowing and cognitive function and overall functional independence prior to discharge with 24/7 Supervision.    Intensity: Minumum of 1-2 x/day, 30 to 90 minutes Frequency: 3 to 5 out of 7 days Duration/Length of Stay: 2 weeks Treatment/Interventions: Cognitive remediation/compensation;Cueing hierarchy;Dysphagia/aspiration precaution training;Environmental controls;Functional tasks;Internal/external aids;Patient/family education;Therapeutic Activities   Daily Session  Skilled Therapeutic Interventions:  Skilled treatment session focused on addressing dysphagia and cognition goals. Upon SLP arrival patient reported needing to go to the bathroom; nurse tech and SLP assisted patient to the bathroom with Max assist verbal and tactile cues for sequencing.  SLP also facilitated session by providing set-up assist and Max assist multimodal cues for sustained  attention to self-feeding for 5 minutes.  Patient also required Max assist multimodal cues to utilize safe swallow strategies during consumption of  Dys.1 textures and thin liquids via cup with occasional throat clear due to oral holding and suspected bolus loss.  Continue with current plan of care.       Function:   Eating Eating   Modified Consistency Diet: Yes Eating Assist Level: Supervision or verbal cues;More than reasonable amount of time;Swallowing techniques: self managed;Help managing cup/glass   Eating Set Up Assist For: Opening containers       Cognition Comprehension Comprehension assist level: Understands basic 25 - 49% of the time/ requires cueing 50 - 75% of the time  Expression   Expression assist level: Expresses basic 25 - 49% of the time/requires cueing 50 - 75% of the time. Uses single words/gestures.  Social Interaction Social Interaction assist level: Interacts appropriately 25 - 49% of time - Needs frequent redirection.  Problem Solving Problem solving assist level: Solves basic less than 25% of the time - needs direction nearly all the time or does not effectively solve problems and may need a restraint for safety  Memory Memory assist level: Recognizes or recalls less than 25% of the time/requires cueing greater than 75% of the time   Pain Pain Assessment Pain Assessment: No/denies pain  Therapy/Group: Individual Therapy  Carmelia Roller., CCC-SLP [phone]  [place] 04/27/2015, 8:58 AM

## 2015-04-27 NOTE — Patient Care Conference (Signed)
Inpatient RehabilitationTeam Conference and Plan of Care Update Date: 04/27/2015   Time: 11;20 AM    Patient Name: Nicole [name]      Medical Record Number: [id number]  Date of Birth: [birth year] Sex: Female         Room/Bed: 4M08C/4M08C-01 Payor Info: Payor: [employer] MEDICARE / Plan: [facility] MEDICARE / Product Type: *No Product type* /    Admitting Diagnosis: CVA  Admit Date/Time:  04/12/2015  6:15 PM Admission Comments: No comment available   Primary Diagnosis:  ICH (intracerebral hemorrhage) ([place]) Principal Problem: ICH (intracerebral hemorrhage) ([place])  Patient Active Problem List   Diagnosis Date Noted  . Left hemiparesis ([place])   . Chronic diastolic congestive heart failure ([place])   . Persistent atrial fibrillation ([place])   . Type 2 diabetes mellitus with peripheral neuropathy ([facility])   . ICH (intracerebral hemorrhage) ([place]) 04/04/2015  . Anticoagulated on Coumadin   . Chronic atrial fibrillation ([place])   . Hypertensive urgency   . SOB (shortness of breath) 04/03/2014  . Epistaxis 02/15/2014  . SBO (small bowel obstruction) ([place]) 01/23/2014  . Diabetic nephropathy ([place]) 09/07/2013  . Chronic constipation 03/25/2013  . Carpal tunnel syndrome 02/25/2013  . Hypothyroidism   . Gout flare 06/01/2012  . Leakage of common bile duct s/p ERCP / stent 05/30/2012  . Ileus post op. still NPO 05/22/2012  . Bile leak, postoperative 05/17/2012  . Hypokalemia 05/17/2012  . Atrial fibrillation with RVR ([place]) 05/16/2012  . Postoperative ileus 05/16/2012  . Small bowel obstruction due to adhesions ([place]) 05/16/2012  . Acute on chronic renal insufficiency ([place]) 05/15/2012  . Normal coronary arteries, 2008 05/15/2012  . Type II or unspecified type diabetes mellitus with neurological manifestations, not stated as uncontrolled 05/15/2012  . HTN (hypertension) 05/15/2012  . Respiratory distress post op secondary to rapid AF 05/15/2012  . ARF (acute renal failure) ([place]) 05/15/2012  .  Abdominal pain, acute, right upper quadrant 05/12/2012  . Nausea and vomiting 05/12/2012  . Dehydration 05/12/2012  . Cholecystitis chronic, acute, S/P lap cholecystectomy 05/14/12 05/12/2012  . Thoracic aortic aneurysm, 4.8cm 2011   . CHF, past NICM with an EF of 30%, most recent echo 2011 showed EF to be45- 50%   . Anemia 08/09/2011  . History of colon cancer, stage III 08/09/2011    Expected Discharge Date: Expected Discharge Date: 05/03/15  Team Members Present: Physician leading conference: Dr. Alysia Penna Social Worker Present: Ovidio Kin, LCSW Nurse Present: Heather Roberts, RN PT Present: Guilford Shi, PT OT Present: Willeen Cass, OT SLP Present: Gunnar Fusi, SLP PPS Coordinator present : Daiva Nakayama, RN, CRRN     Current Status/Progress Goal Weekly Team Focus  Medical   cognitive deficits, confusion  consistent cont BM, improve orientation  see above   Bowel/Bladder   incontinent of bowel and bladder. Occasional continent epidsodes of bladder with timed toileting. LBM 04/28/15.  Managed bowel program  Encourage timed toileting    Swallow/Nutrition/ Hydration   Dys.1 textures and thin liquids with full supervision to ensure swallow initiation between bites  supervision with least restrictive diet   continuation of addressing attention to PO   ADL's   Min - Mod A for Upper body BADL, Mod-Max A for Lower body, Mod Assist for transfer  Overall Mod A for BADL and transfers  Improved functoinal mobility and transfers, attention, memory, awareness, functional use of LUE, toileting   Mobility   Min A bed mobility, mod A sit to stand, min A ambulation, min A  stairs  grossly min A - community level goal, car transfer goal, and stair goal d/c since pt is d/c to snf  L UE/LE NMR, standing balance, motor planning, ambulation, transfers, bed mobility, sit to stands   Communication   continuing to address cognition that impacts communication          Safety/Cognition/  Behavioral Observations  Max-Total assist   goals downgraded to Max assist   continuing to address basic cognition with familiar tasks    Pain   No complaints of pain.   <4  Offer pain medication frequently. Assess for nonverbal cues of pain    Skin   CDI  CDI  Assess skin q shift, encourage pressure relief methods to prevent skin breakdown.       *See Care Plan and progress notes for long and short-term goals.  Barriers to Discharge: heavy physical assist    Possible Resolutions to Barriers:  assess caregiver abilities    Discharge Planning/Teaching Needs:  Family has decided pt will need short term NHP before returning home, will begin search      Team Discussion:  Making progress physically but still cognitive issues and confusion.  Timed toileting otherwise incont B & B, can not figure out the call bell. Diet still Dys 1 thin liquids due to cognition-holds food in her mouth.  But can be inconsistent in her levels.   Revisions to Treatment Plan:  Downgraded goals to min assist level   Continued Need for Acute Rehabilitation Level of Care: The patient requires daily medical management by a physician with specialized training in physical medicine and rehabilitation for the following conditions: Daily direction of a multidisciplinary physical rehabilitation program to ensure safe treatment while eliciting the highest outcome that is of practical value to the patient.: Yes Daily medical management of patient stability for increased activity during participation in an intensive rehabilitation regime.: Yes Daily analysis of laboratory values and/or radiology reports with any subsequent need for medication adjustment of medical intervention for : Neurological problems;Other  Elease Hashimoto 04/29/2015, 8:33 AM

## 2015-04-27 NOTE — [place] Notes (Signed)
Occupational Therapy Session Note  Patient Details  Name: Nicole [name] MRN: [id number] Date of Birth: 1931/05/09  Today's Date: 04/27/2015 OT Individual Time: 1100-1200 OT Individual Time Calculation (min): 60 min    Short Term Goals: Week 2:  OT Short Term Goal 1 (Week 2): Pt will demo ability to lace left pant leg using AE with min assist OT Short Term Goal 2 (Week 2): Pt will demo ability to lace left shirt sleeve with vc to dress left first OT Short Term Goal 3 (Week 2): Pt will complete toilet hygiene standing supported with min assist for thoroughness OT Short Term Goal 4 (Week 2): Pt will groom (wash face, hands, brush teeth) standing at sink with sublte cues and setup assist.  Skilled Therapeutic Interventions/Progress Updates:  Upon entering the room, pt seated in wheelchair and attempting to fold blanket. Pt with no c/o pain this session. Pt oriented to self only. Max multimodal cues and use of external aide for orientation to time, location, and situation. Pt seated in wheelchair for laundry folding task to address problem solving, initiation, and attending to task. Pt initially starting with controlled environment and smaller items such as washcloth and towel with increased time but no real difficulty. Her son and friend arrived during session which provided distraction as pt attempted to fold shirt and pants. Pt perseveration on folding certain parts of clothing and having difficulty completing task. Pt would partially fold and then open and start over. OT provided demonstration and hand over hand assist for routine task. OT educating caregivers on different types of cues for pt in order to make task more successful as well as education provided regarding perseveration on task. Pt remained seated in wheelchair with quick release belt on and SW entering the room to discuss information from conference.   Therapy Documentation Precautions:  Precautions Precautions: Fall Precaution  Comments: L hemiplegia Restrictions Weight Bearing Restrictions: No  See Function Navigator for Current Functional Status.   Therapy/Group: Individual Therapy  Phineas Semen 04/27/2015, 1:12 PM

## 2015-04-27 NOTE — [place] Notes (Signed)
Physical Therapy Weekly Progress Note  Patient Details  Name: Nicole [name] MRN: [id number] Date of Birth: September 05, 1930  Beginning of progress report period: April 21, 2015 End of progress report period: April 27, 2015  Today's Date: 04/27/2015 PT Individual Time: 1330-1500 PT Individual Time Calculation (min): 90 min   Patient has met 4 of 5 short term goals.  Pt is continuing to demonstrate slow progress during IPR PT. Pt has made improvements in level of assist, but the time to complete functional mobility tasks continues to be significantly longer than age-matched norms.   Patient continues to demonstrate the following deficits: low activity tolerance, poor motor planning/initiation, cognitive deficits in all domains, impaired standing balance, difficulty with gait and transfers (especially without armrests to push off of), L hemiplegia and therefore will continue to benefit from skilled PT intervention to enhance overall performance with activity tolerance, postural control, functional use of  left upper extremity and left lower extremity, attention, awareness and coordination.  Patient is making slow progress towards long term goals, but goals revised due to change in discharge destination.  Plan of care revisions: community level goals including stairs and car transfer have been discharged..  PT Short Term Goals Week 2:  PT Short Term Goal 1 (Week 2): Pt will demonstrate side lie to sit req min A.  PT Short Term Goal 1 - Progress (Week 2): Met PT Short Term Goal 2 (Week 2): Pt will demonstrate sit to stand req min A.  PT Short Term Goal 2 - Progress (Week 2): Progressing toward goal (min A from w/c, but mod A from eob) PT Short Term Goal 3 (Week 2): Pt will demonstrate sit to side lie req min A consistently.  PT Short Term Goal 3 - Progress (Week 2): Met PT Short Term Goal 4 (Week 2): Pt will consistently ambulate >= 150' with RW req min A.  PT Short Term Goal 4 - Progress (Week  2): Met (when given increased tme to complete task) PT Short Term Goal 5 (Week 2): Pt will ambulate up/down 3 steps with B rails req min A.  PT Short Term Goal 5 - Progress (Week 2): Met Week 3:  PT Short Term Goal 1 (Week 3): STGs  = LTGs due to ELOS  Skilled Therapeutic Interventions/Progress Updates:    Pt received up in w/c, c/o fatigue and feeling cold, wrapped in a blanket with quick release belt underneath. SO [name] present entire session. Therapeutic Activity - see function tab for bed mobility, transfer, and toileting details. Pt demonstrates ability to sit to stand transfer with RW from w/c with armrests req CGA, but req mod A from eob due to no armrests and poorer leverage. Pt incontinent of BM during PT session and despite PT asking pt if she needed to go to the bathroom, pt denied this for 30 minutes, until agreeing to toilet. Gait Training - see function tab for details - pt ambulates extremely slowly and is able to increase gait speed when told to walk as fast as possible, but becomes easily distracted in the hallway, even when no one else is present. Pt ended resting in bed comfortable with all needs in reach. Continue per PT pOC.   Therapy Documentation Precautions:  Precautions Precautions: Fall Precaution Comments: L hemiplegia Restrictions Weight Bearing Restrictions: No Vital Signs: Therapy Vitals Temp: 97.9 F (36.6 C) Temp Source: Oral Pulse Rate: 71 Resp: 18 BP: (!) 157/84 mmHg Patient Position (if appropriate): Sitting Oxygen Therapy SpO2: 99 %  O2 Device: Not Delivered Pain: Pain Assessment Pain Assessment: 0-10 Pain Score: 6  Pain Type: Neuropathic pain Pain Location: Shoulder Pain Orientation: Left Pain Descriptors / Indicators: Aching Pain Onset: With Activity Pain Intervention(s): Rest Multiple Pain Sites: No   See Function Navigator for Current Functional Status.  Therapy/Group: Individual Therapy  Karianne Nogueira M 04/27/2015, 2:59 PM

## 2015-04-27 NOTE — [place] Notes (Signed)
Social Work Patient ID: Nicole [name], female   DOB: Jan 01, 1931, 81 y.o.   MRN: [id number] Met with pt, boyfriend, son and speaker phone with daughter to discuss team conference progress toward her goals and discharge plan. All in agreement with short term NHP from here then hopefully home. Would like for worker to begin looking at [facility] since cloes to their homes and if not there will come up with others. Will begin NH process.

## 2015-04-28 ENCOUNTER — Inpatient Hospital Stay (HOSPITAL_COMMUNITY): Payer: Medicare Other | Admitting: Physical Therapy

## 2015-04-28 ENCOUNTER — Inpatient Hospital Stay (HOSPITAL_COMMUNITY): Payer: Medicare Other | Admitting: Speech Pathology

## 2015-04-28 ENCOUNTER — Inpatient Hospital Stay (HOSPITAL_COMMUNITY): Payer: Medicare Other

## 2015-04-28 LAB — GLUCOSE, CAPILLARY
GLUCOSE-CAPILLARY: 128 mg/dL — AB (ref 65–99)
GLUCOSE-CAPILLARY: 154 mg/dL — AB (ref 65–99)
GLUCOSE-CAPILLARY: 140 mg/dL — AB (ref 65–99)
Glucose-Capillary: 203 mg/dL — ABNORMAL HIGH (ref 65–99)

## 2015-04-28 NOTE — Progress Notes (Signed)
Patient ID: Nicole [name], female   DOB: 1931-03-17, 81 y.o.   MRN: [id number]   Subjective/Complaints: Thinks there are 2 men in the room with her, only one is there, she has difficulty recalling his name   ROS- Denies bowel, bladder breathing or pain issues, limited due to cognition  Objective: Vital Signs: Blood pressure 156/94, pulse 74, temperature 98.8 F (37.1 C), temperature source Oral, resp. rate 17, height 5' 9"  (1.753 m), weight 78 kg (171 lb 15.3 oz), SpO2 98 %. No results found. Results for orders placed or performed during the hospital encounter of 04/12/15 (from the past 72 hour(s))  Glucose, capillary     Status: Abnormal   Collection Time: 04/25/15 12:04 PM  Result Value Ref Range   Glucose-Capillary 183 (H) 65 - 99 mg/dL  Glucose, capillary     Status: Abnormal   Collection Time: 04/25/15  4:24 PM  Result Value Ref Range   Glucose-Capillary 123 (H) 65 - 99 mg/dL  Glucose, capillary     Status: Abnormal   Collection Time: 04/25/15  9:09 PM  Result Value Ref Range   Glucose-Capillary 154 (H) 65 - 99 mg/dL  Glucose, capillary     Status: Abnormal   Collection Time: 04/26/15  6:38 AM  Result Value Ref Range   Glucose-Capillary 161 (H) 65 - 99 mg/dL  Glucose, capillary     Status: Abnormal   Collection Time: 04/26/15 12:00 PM  Result Value Ref Range   Glucose-Capillary 196 (H) 65 - 99 mg/dL  Glucose, capillary     Status: Abnormal   Collection Time: 04/26/15  5:02 PM  Result Value Ref Range   Glucose-Capillary 124 (H) 65 - 99 mg/dL  Glucose, capillary     Status: Abnormal   Collection Time: 04/26/15  8:45 PM  Result Value Ref Range   Glucose-Capillary 110 (H) 65 - 99 mg/dL  Glucose, capillary     Status: Abnormal   Collection Time: 04/27/15  6:48 AM  Result Value Ref Range   Glucose-Capillary 157 (H) 65 - 99 mg/dL  Basic metabolic panel     Status: Abnormal   Collection Time: 04/27/15  9:36 AM  Result Value Ref Range   Sodium 135 135 - 145 mmol/L    Potassium 4.3 3.5 - 5.1 mmol/L   Chloride 101 101 - 111 mmol/L   CO2 26 22 - 32 mmol/L   Glucose, Bld 240 (H) 65 - 99 mg/dL   BUN 8 6 - 20 mg/dL   Creatinine, Ser 1.13 (H) 0.44 - 1.00 mg/dL   Calcium 9.1 8.9 - 10.3 mg/dL   GFR calc non Af Amer 44 (L) >60 mL/min   GFR calc Af Amer 51 (L) >60 mL/min    Comment: (NOTE) The eGFR has been calculated using the CKD EPI equation. This calculation has not been validated in all clinical situations. eGFR's persistently <60 mL/min signify possible Chronic Kidney Disease.    Anion gap 8 5 - 15  Glucose, capillary     Status: Abnormal   Collection Time: 04/27/15 11:32 AM  Result Value Ref Range   Glucose-Capillary 221 (H) 65 - 99 mg/dL  Glucose, capillary     Status: None   Collection Time: 04/27/15  4:03 PM  Result Value Ref Range   Glucose-Capillary 99 65 - 99 mg/dL  Glucose, capillary     Status: Abnormal   Collection Time: 04/27/15  9:13 PM  Result Value Ref Range   Glucose-Capillary 120 (H) 65 - 99  mg/dL  Glucose, capillary     Status: Abnormal   Collection Time: 04/28/15  6:45 AM  Result Value Ref Range   Glucose-Capillary 128 (H) 65 - 99 mg/dL     HEENT: normal Cardio: IRRR and diastolic murmur Resp: CTA B/L and unlabored GI: BS positive and non distended Extremity:  No Edema Skin:   Intact Neuro: pleasantly Confused, Flat, Cranial Nerve grossly II-XII normal, Abnormal Motor 3- Left delt bi tri 3 grip 3- L HF, 4- KE , 3- ankle DF, 5/5 on Right side and Abnormal [facility] Ataxic/ dec [facility] Musc/Skel:  Left shoulder pain with all movement incl ext rotation (slightly improved). ROM very restricted Gen NAD  Assessment/Plan: 1. Functional deficits secondary to right caudate intercerebral hemorrhage secondary to hypertensive crisis with left hemiparesis and cognitive deficits which require 3+ hours per day of interdisciplinary therapy in a comprehensive inpatient rehab setting. Physiatrist is providing close team supervision and 24 hour  management of active medical problems listed below. Physiatrist and rehab team continue to assess barriers to discharge/monitor patient progress toward functional and medical goals.   FIM: Function - Bathing Position: Shower Body parts bathed by patient: Right arm, Left arm, Chest, Abdomen, Front perineal area, Buttocks, Right upper leg, Right lower leg, Left upper leg Body parts bathed by helper: Left lower leg, Back Bathing not applicable: Front perineal area, Buttocks (pt has been cleansed prior to session by nursing) Assist Level: Touching or steadying assistance(Pt > 75%)  Function- Upper Body Dressing/Undressing What is the patient wearing?: Bra, Pull over shirt/dress Bra - Perfomed by patient: Thread/unthread right bra strap, Thread/unthread left bra strap Bra - Perfomed by helper: Hook/unhook bra (pull down sports bra) Pull over shirt/dress - Perfomed by patient: Thread/unthread right sleeve, Thread/unthread left sleeve Pull over shirt/dress - Perfomed by helper: Put head through opening, Pull shirt over trunk Button up shirt - Perfomed by patient: Thread/unthread right sleeve Button up shirt - Perfomed by helper: Thread/unthread left sleeve, Pull shirt around back, Button/unbutton shirt Assist Level: Touching or steadying assistance(Pt > 75%) Function - Lower Body Dressing/Undressing What is the patient wearing?: Pants, Shoes, AFO Position: Wheelchair/chair at sink Underwear - Performed by patient: Thread/unthread right underwear leg Underwear - Performed by helper: Thread/unthread right underwear leg, Thread/unthread left underwear leg, Pull underwear up/down (Brief worn) Pants- Performed by patient: Thread/unthread right pants leg, Pull pants up/down Pants- Performed by helper: Thread/unthread left pants leg Non-skid slipper socks- Performed by helper: Don/doff right sock, Don/doff left sock Shoes - Performed by patient: Don/doff right shoe, Don/doff left shoe Shoes -  Performed by helper: Don/doff right shoe, Don/doff left shoe, Fasten right, Fasten left TED Hose - Performed by helper: Don/doff right TED hose, Don/doff left TED hose Assist for lower body dressing: Touching or steadying assistance (Pt > 75%)  Function - Toileting Toileting activity did not occur: No continent bowel/bladder event Toileting steps completed by patient: Adjust clothing prior to toileting, Adjust clothing after toileting Toileting steps completed by helper: Performs perineal hygiene Toileting Assistive Devices: Grab bar or rail Assist level: Set up/obtain supplies  Function - Air cabin crew transfer activity did not occur: Safety/medical concerns Toilet transfer assistive device: Grab bar Assist level to toilet: Touching or steadying assistance (Pt > 75%) Assist level from toilet: Moderate assist (Pt 50 - 74%/lift or lower) Assist level to bedside commode (at bedside): Touching or steadying assistance (Pt > 75%) Assist level from bedside commode (at bedside): Moderate assist (Pt 50 - 74%/lift or lower)  Function -  Chair/bed transfer Chair/bed transfer method: Stand pivot Chair/bed transfer assist level: Touching or steadying assistance (Pt > 75%) Chair/bed transfer assistive device: Walker, Armrests Chair/bed transfer details: Manual facilitation for weight shifting, Manual facilitation for placement, Verbal cues for sequencing, Verbal cues for precautions/safety, Verbal cues for technique, Verbal cues for safe use of DME/AE  Function - Locomotion: Wheelchair Will patient use wheelchair at discharge?: Yes Type: Manual Max wheelchair distance: 75 Assist Level: Moderate assistance (Pt 50 - 74%) Wheel 50 feet with 2 turns activity did not occur: Safety/medical concerns Assist Level: Moderate assistance (Pt 50 - 74%) Wheel 150 feet activity did not occur: Safety/medical concerns Assist Level: Dependent (Pt equals 0%) Turns around,maneuvers to table,bed, and  toilet,negotiates 3% grade,maneuvers on rugs and over doorsills: No Function - Locomotion: Ambulation Assistive device: Walker-rolling Max distance: 200' Assist level: Touching or steadying assistance (Pt > 75%) Assist level: Touching or steadying assistance (Pt > 75%) Assist level: Touching or steadying assistance (Pt > 75%) Walk 150 feet activity did not occur: Safety/medical concerns Assist level: Touching or steadying assistance (Pt > 75%) Assist level: Maximal assist (Pt 25 - 49%)  Function - Comprehension Comprehension: Auditory Comprehension assist level: Understands basic 25 - 49% of the time/ requires cueing 50 - 75% of the time  Function - Expression Expression: Verbal Expression assist level: Expresses basic 25 - 49% of the time/requires cueing 50 - 75% of the time. Uses single words/gestures.  Function - Social Interaction Social Interaction assist level: Interacts appropriately 25 - 49% of time - Needs frequent redirection.  Function - Problem Solving Problem solving assist level: Solves basic less than 25% of the time - needs direction nearly all the time or does not effectively solve problems and may need a restraint for safety  Function - Memory Memory assist level: Recognizes or recalls less than 25% of the time/requires cueing greater than 75% of the time Patient normally able to recall (first 3 days only): None of the above  Medical Problem List and Plan: 1. Functional deficits secondary to right caudate intercerebral hemorrhage secondary to hypertensive crisis 2. DVT Prophylaxis/Anticoagulation: Subcutaneous heparin added for DVT prophylaxis 04/08/2015, change to lovenox for improved efficacy and reduced needle sticks 3. Pain Management: Tylenol as needed, post stroke shoulder pain, limited ROM, developing spasticity, start with local measures, aggressive PT/OT, tramadol for pain, Left AC and GH OA noted on Xray, position Left arm in external rotation  -heat,  sports cream. ?inject 4. Hypertension. Clonidine 0.2 mg 3 times a day, Cardizem 240 mg daily, changed to 45m q6 on 10/1 due to the necessity of crushing medication, lisinopril 20 mg twice a day, Lopressor 25 mg twice a day. Will maintain BB dose; once intake improves will restart lasix, SYs BP >150 this am but generally in an acceptable range 5. Neuropsych: This patient is capable of making decisions on her own behalf. 6. Skin/Wound Care: Routine skin checks 7. Fluids/Electrolytes/Nutrition: Routine I&O with follow-up chemistries, meal intake 30-50%, Intake ~1258mfluid, IVF at noc, encourage daytime po intake 8. Systolic congestive heart failure. Check daily weights and I's and O's. Patient on Lasix 40 mg daily prior to admission. Resume as needed currently fluid intake very low , minimal pedal edema 9. Atrial fibrillation. Chronic Coumadin discontinued secondary to intracerebral hemorrhage. Cardiac rate controlled. Continue Cardizem and Lopressor 10. Diabetes mellitus peripheral neuropathy. Hemoglobin A1c 5.9. Sliding scale insulin. Check blood sugars before meals and at bedtime. Patient on Glucophage 500 mg twice a day prior to admission.   -  initiated glucophage 269m bid but CBG dropped, will hold for now, appetite is variable  -  11. Chronic renal insufficiency. Baseline creatinine 1.35. 1.14 most recently--- cont to push po fluids.IVF at noc,  12. Hypothyroidism. TSH level 2.053. Continue Synthroid 13. History of gout. Zyloprim 100 mg daily. Not active currently  14. Hyperlipidemia. Resume statin at discharge.  16. Urinary incontinence: timed voids. ucx proteus S to Keflex, finished treatment 17. Hyponatremia Likely due to IVF, mild, recheck BMET normal  LOS (Days) 16 A FACE TO FACE EVALUATION WAS PERFORMED  Nishita Isaacks E 04/28/2015, 7:54 AM

## 2015-04-28 NOTE — Progress Notes (Signed)
Speech Language Pathology Daily Session Note  Patient Details  Name: Nicole [name] MRN: [id number] Date of Birth: Jul 13, 1931  Today's Date: 04/28/2015 SLP Individual Time: 1030-1130 SLP Individual Time Calculation (min): 60 min  Short Term Goals: Week 3: SLP Short Term Goal 1 (Week 3): Patient will utilize external aids/cues for orientation to place, time and situation with Max A multimodal cues.  SLP Short Term Goal 2 (Week 3): Patient will demonstrate sustained attention to functional tasks for 8 minutes with Max A multimodal cues.  SLP Short Term Goal 3 (Week 3): Patient will follow 1-step directions in 75% of opportunities with Max A multimodal cues.  SLP Short Term Goal 4 (Week 3): Patient will consume current diet with minimal overt s/s of aspiration with Mod A multimodal cues for use of swallow strategies.  SLP Short Term Goal 5 (Week 3): Patient will problem solve use of call bell to request help with Max A multimodal cues.  Skilled Therapeutic Interventions: Skilled treatment session focused on addressing cognition goals. SLP facilitated session by re-orienting patient, who was requesting to go shopping.  SLP unable to redirect patient so as a result facilitated session with a basic money management task in preparation for shopping later, which resulted in improved sustained attention to task; patient sustained attention to task for ~20 minutes with Mod assist verbal cues.  Patient required Min assist multimodal cues to sort coins and count them accurately.  SLP also provided Mod assist multimodal cues assist during basic change making task that required patient to add and subtract.  Patient required Max assist multimodal cues for awareness of errors.  Continue with current plan of care.   Function:  Cognition Comprehension Comprehension assist level: Understands basic 25 - 49% of the time/ requires cueing 50 - 75% of the time  Expression   Expression assist level: Expresses basic  50 - 74% of the time/requires cueing 25 - 49% of the time. Needs to repeat parts of sentences.  Social Interaction Social Interaction assist level: Interacts appropriately 50 - 74% of the time - May be physically or verbally inappropriate.  Problem Solving Problem solving assist level: Solves basic less than 25% of the time - needs direction nearly all the time or does not effectively solve problems and may need a restraint for safety  Memory Memory assist level: Recognizes or recalls less than 25% of the time/requires cueing greater than 75% of the time    Pain Pain Assessment Pain Assessment: No/denies pain Pain Score: 0-No pain Faces Pain Scale: No hurt  Therapy/Group: Individual Therapy  Carmelia Roller., CCC-SLP [phone]  [place] 04/28/2015, 7:05 PM

## 2015-04-28 NOTE — Progress Notes (Signed)
Physical Therapy Session Note  Patient Details  Name: Nicole [name] MRN: [id number] Date of Birth: 1930/12/29  Today's Date: 04/28/2015 PT Individual Time: 1300-1400 PT Individual Time Calculation (min): 60 min   Short Term Goals: Week 3:  PT Short Term Goal 1 (Week 3): STGs  = LTGs due to ELOS  Skilled Therapeutic Interventions/Progress Updates:    Pt received up in w/c with daughter curling her hair, agreeable to PT session. Grandson and sister present initially, as well. Gait Training - PT instructs pt in ambulation with HHA x 225' req min A for balance and various cues to take large steps and speed up gait. Neuromuscular Reeducation - PT instructs pt in forced use of L UE in sit reaching for clothespin and then placing it on flagpole x 20 reps, then in standing removing clothespins from flagpole with L hand, focus on fine motor control and accuracy dropping them into tupperware. W/C Management - PT instructs pt in w/c propulsion with B UEs x 25' req repeated verbal cues for attention to task and min A for steering - pt demonstrates small UE movements and poor sustained attention. Pt ended up in w/c with quick release belt in place, all needs in reach. L UE AROM and strength is improving, evidenced by forced use activity. Continue per PT POC.   Therapy Documentation Precautions:  Precautions Precautions: Fall Precaution Comments: L hemiplegia Restrictions Weight Bearing Restrictions: No  Pain: Pain Assessment Pain Assessment: Faces Faces Pain Scale: Hurts little more Pain Type: Neuropathic pain Pain Location: Shoulder Pain Orientation: Left Pain Descriptors / Indicators: Aching Pain Onset: With Activity Pain Intervention(s): Rest Multiple Pain Sites: No   See Function Navigator for Current Functional Status.   Therapy/Group: Individual Therapy  [name] 04/28/2015, 1:39 PM

## 2015-04-28 NOTE — Plan of Care (Signed)
Problem: RH Car Transfers Goal: LTG Patient will perform car transfers with assist (PT) LTG: Patient will perform car transfers with assistance (PT).  Outcome: Not Applicable Date Met:  74/45/14 D/C destination changed to SNF placement.   Problem: RH Ambulation Goal: LTG Patient will ambulate in community environment (PT) LTG: Patient will ambulate in community environment, # of feet with assistance (PT).  Outcome: Not Applicable Date Met:  60/47/99 D/C destination changed to SNF placement.   Problem: RH Stairs Goal: LTG Patient will ambulate up and down stairs w/assist (PT) LTG: Patient will ambulate up and down # of stairs with assistance (PT)  Outcome: Not Applicable Date Met:  87/21/58 D/C destination changed to SNF placement.

## 2015-04-28 NOTE — Progress Notes (Signed)
Occupational Therapy Session Note  Patient Details  Name: Nicole [name] MRN: [id number] Date of Birth: 1930/09/14  Today's Date: 04/28/2015 OT Individual Time: 0900-1000 &  1130-11:55 OT Individual Time Calculation (min): 90 min    Short Term Goals: Week 2:  OT Short Term Goal 1 (Week 2): Pt will demo ability to lace left pant leg using AE with min assist OT Short Term Goal 2 (Week 2): Pt will demo ability to lace left shirt sleeve with vc to dress left first OT Short Term Goal 3 (Week 2): Pt will complete toilet hygiene standing supported with min assist for thoroughness OT Short Term Goal 4 (Week 2): Pt will groom (wash face, hands, brush teeth) standing at sink with sublte cues and setup assist.  Skilled Therapeutic Interventions/Progress Updates: ADL-retraining with focus on improved efficiency with performance of BADL, rest and seated grooming.   Pt received seated in w/c and rises to RW to complete bathing/dressing after max vc to participate while her friend [name] is present.   Friend leaves room to provoke improved participation and pt completes all BADL tasks to include transfers (to/from toilet and tub bench in walk-in shower) and dressing near edge of bed.    Pt able to complete all tasks within 1 hour within overall mod assist to dress lower body when redirected using continuous cues to sequence with distractions removed.   Pt dresses standing at bed rails at edge of bed but is too distracted by grooming items when they are positioned nearby.   OT returns to pt after rest break and assists with grooming, focusing on nail care and cleaning pt's ears using q-tip applicators with mod assist.   Pt left in w/c at end of session as her sister arrives to visit during lunch.        Therapy Documentation Precautions:  Precautions Precautions: Fall Precaution Comments: L hemiplegia Restrictions Weight Bearing Restrictions: No  Pain: No/denies pain  See Function Navigator for Current  Functional Status.  Therapy/Group: Individual Therapy  [place] 04/28/2015, 12:55 PM

## 2015-04-29 ENCOUNTER — Inpatient Hospital Stay (HOSPITAL_COMMUNITY): Payer: Medicare Other | Admitting: Physical Therapy

## 2015-04-29 ENCOUNTER — Inpatient Hospital Stay (HOSPITAL_COMMUNITY): Payer: Medicare Other | Admitting: Speech Pathology

## 2015-04-29 ENCOUNTER — Inpatient Hospital Stay (HOSPITAL_COMMUNITY): Payer: Medicare Other

## 2015-04-29 LAB — GLUCOSE, CAPILLARY
GLUCOSE-CAPILLARY: 144 mg/dL — AB (ref 65–99)
Glucose-Capillary: 201 mg/dL — ABNORMAL HIGH (ref 65–99)
Glucose-Capillary: 111 mg/dL — ABNORMAL HIGH (ref 65–99)
Glucose-Capillary: 134 mg/dL — ABNORMAL HIGH (ref 65–99)

## 2015-04-29 NOTE — Plan of Care (Signed)
Problem: RH Ambulation Goal: LTG Patient will ambulate in home environment (PT) LTG: Patient will ambulate in home environment, # of feet with assistance (PT).  Outcome: Not Applicable Date Met:  63/49/49 Goal d/c due to SNF placement  Problem: RH Wheelchair Mobility Goal: LTG Patient will propel w/c in home environment (PT) LTG: Patient will propel wheelchair in home environment, # of feet with assistance (PT).  Outcome: Not Applicable Date Met:  44/73/95 Goal d/c due to SNF placement

## 2015-04-29 NOTE — Discharge Summary (Signed)
Discharge summary job # [id number]

## 2015-04-29 NOTE — Progress Notes (Signed)
Speech Language Pathology Daily Session Note  Patient Details  Name: Nicole [name] MRN: [id number] Date of Birth: [birth year]  Today's Date: 04/29/2015 SLP Individual Time: 1300-1330 SLP Individual Time Calculation (min): 30 min  Short Term Goals: Week 3: SLP Short Term Goal 1 (Week 3): Patient will utilize external aids/cues for orientation to place, time and situation with Max A multimodal cues.  SLP Short Term Goal 2 (Week 3): Patient will demonstrate sustained attention to functional tasks for 8 minutes with Max A multimodal cues.  SLP Short Term Goal 3 (Week 3): Patient will follow 1-step directions in 75% of opportunities with Max A multimodal cues.  SLP Short Term Goal 4 (Week 3): Patient will consume current diet with minimal overt s/s of aspiration with Mod A multimodal cues for use of swallow strategies.  SLP Short Term Goal 5 (Week 3): Patient will problem solve use of call bell to request help with Max A multimodal cues.  Skilled Therapeutic Interventions: Skilled treatment session focused on cognitive and dysphagia. Upon arrival, patient was awake while supine in bed and agreeable to participate in treatment session. Patient was transferred to the wheelchair and required extra time and Max A multimodal cues for initiation and attention to task due to verbosity. Patient consumed her lunch meal of Dys. 1 textures with thin liquids via cup and demonstrated intermittent overt s/s of aspiration and required Total A for utilization of swallowing compensatory strategies. Patient handed off to NT to finish full supervision. Continue with current plan of care.    Function:  Eating Eating   Modified Consistency Diet: Yes Eating Assist Level: Supervision or verbal cues;Swallowing techniques: self managed;Set up assist for   Eating Set Up Assist For: Opening containers       Cognition Comprehension Comprehension assist level: Understands basic 75 - 89% of the time/ requires cueing 10  - 24% of the time  Expression   Expression assist level: Expresses basic 50 - 74% of the time/requires cueing 25 - 49% of the time. Needs to repeat parts of sentences.  Social Interaction Social Interaction assist level: Interacts appropriately 25 - 49% of time - Needs frequent redirection.  Problem Solving Problem solving assist level: Solves basic less than 25% of the time - needs direction nearly all the time or does not effectively solve problems and may need a restraint for safety  Memory Memory assist level: Recognizes or recalls less than 25% of the time/requires cueing greater than 75% of the time    Pain Pain Assessment Pain Assessment: No/denies pain  Therapy/Group: Individual Therapy  Daijanae Rafalski 04/29/2015, 4:30 PM

## 2015-04-29 NOTE — Progress Notes (Signed)
Social Work Patient ID: Nicole [name], female   DOB: 1931/03/10, 81 y.o.   MRN: [id number] Spoke with [facility] who has offered a bed for Monday. Have contacted Gloria-daughter to inform the admission person would be contacting her to complete paperwork. Will work on transfer for Monday. Pt has been informed and is agreeable.

## 2015-04-29 NOTE — Progress Notes (Signed)
Patient ID: Nicole [name], female   DOB: 1930/11/26, 81 y.o.   MRN: [id number]   Subjective/Complaints: Pleasantly confused, "we're in [facility]" No pain c/os, no SOB  ROS- Denies bowel, bladder, breathing problems, limited due to cognition  Objective: Vital Signs: Blood pressure 154/81, pulse 66, temperature 98.6 F (37 C), temperature source Oral, resp. rate 18, height 5' 9"  (1.753 m), weight 78 kg (171 lb 15.3 oz), SpO2 96 %. No results found. Results for orders placed or performed during the hospital encounter of 04/12/15 (from the past 72 hour(s))  Glucose, capillary     Status: Abnormal   Collection Time: 04/26/15 12:00 PM  Result Value Ref Range   Glucose-Capillary 196 (H) 65 - 99 mg/dL  Glucose, capillary     Status: Abnormal   Collection Time: 04/26/15  5:02 PM  Result Value Ref Range   Glucose-Capillary 124 (H) 65 - 99 mg/dL  Glucose, capillary     Status: Abnormal   Collection Time: 04/26/15  8:45 PM  Result Value Ref Range   Glucose-Capillary 110 (H) 65 - 99 mg/dL  Glucose, capillary     Status: Abnormal   Collection Time: 04/27/15  6:48 AM  Result Value Ref Range   Glucose-Capillary 157 (H) 65 - 99 mg/dL  Basic metabolic panel     Status: Abnormal   Collection Time: 04/27/15  9:36 AM  Result Value Ref Range   Sodium 135 135 - 145 mmol/L   Potassium 4.3 3.5 - 5.1 mmol/L   Chloride 101 101 - 111 mmol/L   CO2 26 22 - 32 mmol/L   Glucose, Bld 240 (H) 65 - 99 mg/dL   BUN 8 6 - 20 mg/dL   Creatinine, Ser 1.13 (H) 0.44 - 1.00 mg/dL   Calcium 9.1 8.9 - 10.3 mg/dL   GFR calc non Af Amer 44 (L) >60 mL/min   GFR calc Af Amer 51 (L) >60 mL/min    Comment: (NOTE) The eGFR has been calculated using the CKD EPI equation. This calculation has not been validated in all clinical situations. eGFR's persistently <60 mL/min signify possible Chronic Kidney Disease.    Anion gap 8 5 - 15  Glucose, capillary     Status: Abnormal   Collection Time: 04/27/15 11:32 AM   Result Value Ref Range   Glucose-Capillary 221 (H) 65 - 99 mg/dL  Glucose, capillary     Status: None   Collection Time: 04/27/15  4:03 PM  Result Value Ref Range   Glucose-Capillary 99 65 - 99 mg/dL  Glucose, capillary     Status: Abnormal   Collection Time: 04/27/15  9:13 PM  Result Value Ref Range   Glucose-Capillary 120 (H) 65 - 99 mg/dL  Glucose, capillary     Status: Abnormal   Collection Time: 04/28/15  6:45 AM  Result Value Ref Range   Glucose-Capillary 128 (H) 65 - 99 mg/dL  Glucose, capillary     Status: Abnormal   Collection Time: 04/28/15 11:38 AM  Result Value Ref Range   Glucose-Capillary 154 (H) 65 - 99 mg/dL  Glucose, capillary     Status: Abnormal   Collection Time: 04/28/15  4:40 PM  Result Value Ref Range   Glucose-Capillary 140 (H) 65 - 99 mg/dL  Glucose, capillary     Status: Abnormal   Collection Time: 04/28/15  9:25 PM  Result Value Ref Range   Glucose-Capillary 203 (H) 65 - 99 mg/dL   Comment 1 Notify RN   Glucose, capillary  Status: Abnormal   Collection Time: 04/29/15  6:52 AM  Result Value Ref Range   Glucose-Capillary 144 (H) 65 - 99 mg/dL   Comment 1 Notify RN      HEENT: normal Cardio: IRRR and diastolic murmur Resp: CTA B/L and unlabored GI: BS positive and non distended Extremity:  No Edema Skin:   Intact Neuro: pleasantly Confused, Flat, Cranial Nerve grossly II-XII normal, Abnormal Motor 3- Left delt bi tri 3 grip 3- L HF, 4- KE , 3- ankle DF, 5/5 on Right side and Abnormal FMC Ataxic/ dec [facility] Musc/Skel:  Left shoulder pain with all movement incl ext rotation (slightly improved). ROM very restricted Gen NAD  Assessment/Plan: 1. Functional deficits secondary to right caudate intercerebral hemorrhage secondary to hypertensive crisis with left hemiparesis and cognitive deficits which require 3+ hours per day of interdisciplinary therapy in a comprehensive inpatient rehab setting. Physiatrist is providing close team supervision and 24  hour management of active medical problems listed below. Physiatrist and rehab team continue to assess barriers to discharge/monitor patient progress toward functional and medical goals.   FIM: Function - Bathing Position: Shower Body parts bathed by patient: Right arm, Left arm, Chest, Abdomen, Front perineal area, Right upper leg, Left upper leg, Right lower leg Body parts bathed by helper: Buttocks, Left lower leg, Back Bathing not applicable: Front perineal area, Buttocks (pt has been cleansed prior to session by nursing) Assist Level: Touching or steadying assistance(Pt > 75%)  Function- Upper Body Dressing/Undressing What is the patient wearing?: Bra, Pull over shirt/dress Bra - Perfomed by patient: Thread/unthread right bra strap, Thread/unthread left bra strap Bra - Perfomed by helper: Hook/unhook bra (pull down sports bra) Pull over shirt/dress - Perfomed by patient: Thread/unthread right sleeve, Thread/unthread left sleeve, Put head through opening, Pull shirt over trunk Pull over shirt/dress - Perfomed by helper: Put head through opening, Pull shirt over trunk Button up shirt - Perfomed by patient: Thread/unthread right sleeve Button up shirt - Perfomed by helper: Thread/unthread left sleeve, Pull shirt around back, Button/unbutton shirt Assist Level: Touching or steadying assistance(Pt > 75%) Function - Lower Body Dressing/Undressing What is the patient wearing?: Pants, Ted Hose, Shoes Position: Standing at sink Underwear - Performed by patient: Thread/unthread right underwear leg Underwear - Performed by helper: Thread/unthread right underwear leg, Thread/unthread left underwear leg, Pull underwear up/down (brief) Pants- Performed by patient: Thread/unthread right pants leg, Thread/unthread left pants leg Pants- Performed by helper: Pull pants up/down Non-skid slipper socks- Performed by helper: Don/doff right sock, Don/doff left sock Shoes - Performed by patient: Don/doff  right shoe, Don/doff left shoe Shoes - Performed by helper: Don/doff right shoe, Don/doff left shoe, Fasten right, Fasten left TED Hose - Performed by helper: Don/doff right TED hose, Don/doff left TED hose Assist for lower body dressing: Touching or steadying assistance (Pt > 75%)  Function - Toileting Toileting activity did not occur: No continent bowel/bladder event Toileting steps completed by patient: Performs perineal hygiene Toileting steps completed by helper: Adjust clothing prior to toileting, Adjust clothing after toileting Toileting Assistive Devices: Grab bar or rail Assist level: Touching or steadying assistance (Pt.75%)  Function - Air cabin crew transfer activity did not occur: Safety/medical concerns Toilet transfer assistive device: Bedside commode Assist level to toilet: Touching or steadying assistance (Pt > 75%) Assist level from toilet: Touching or steadying assistance (Pt > 75%) Assist level to bedside commode (at bedside): Touching or steadying assistance (Pt > 75%) Assist level from bedside commode (at bedside): Moderate assist (  Pt 50 - 74%/lift or lower)  Function - Chair/bed transfer Chair/bed transfer method: Ambulatory Chair/bed transfer assist level: Touching or steadying assistance (Pt > 75%) Chair/bed transfer assistive device: Other (HHA) Chair/bed transfer details: Manual facilitation for weight shifting, Verbal cues for technique, Verbal cues for precautions/safety  Function - Locomotion: Wheelchair Will patient use wheelchair at discharge?: Yes Type: Manual Max wheelchair distance: 30' Assist Level: Moderate assistance (Pt 50 - 74%) Wheel 50 feet with 2 turns activity did not occur: Safety/medical concerns Assist Level: Moderate assistance (Pt 50 - 74%) Wheel 150 feet activity did not occur: Safety/medical concerns Assist Level: Dependent (Pt equals 0%) Turns around,maneuvers to table,bed, and toilet,negotiates 3% grade,maneuvers on rugs  and over doorsills: No Function - Locomotion: Ambulation Assistive device: Hand held assist Max distance: 200' Assist level: Touching or steadying assistance (Pt > 75%) Assist level: Touching or steadying assistance (Pt > 75%) Assist level: Touching or steadying assistance (Pt > 75%) Walk 150 feet activity did not occur: Safety/medical concerns Assist level: Touching or steadying assistance (Pt > 75%) Assist level: Maximal assist (Pt 25 - 49%)  Function - Comprehension Comprehension: Auditory Comprehension assist level: Understands basic less than 25% of the time/ requires cueing >75% of the time  Function - Expression Expression: Verbal Expression assist level: Expresses basic 50 - 74% of the time/requires cueing 25 - 49% of the time. Needs to repeat parts of sentences.  Function - Social Interaction Social Interaction assist level: Interacts appropriately 50 - 74% of the time - May be physically or verbally inappropriate.  Function - Problem Solving Problem solving assist level: Solves basic 25 - 49% of the time - needs direction more than half the time to initiate, plan or complete simple activities  Function - Memory Memory assist level: Recognizes or recalls less than 25% of the time/requires cueing greater than 75% of the time Patient normally able to recall (first 3 days only): None of the above  Medical Problem List and Plan: 1. Functional deficits secondary to right caudate intercerebral hemorrhage secondary to hypertensive crisis 2. DVT Prophylaxis/Anticoagulation: Subcutaneous heparin added for DVT prophylaxis 04/08/2015, change to lovenox for improved efficacy and reduced needle sticks 3. 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 ?inject 4. Hypertension.  Clonidine 0.2 mg 3 times a day, Cardizem 240 mg daily, changed to 27m q6 on 10/1 due to the necessity of crushing medication, lisinopril 20 mg twice a day, Lopressor 25 mg twice a day. Will maintain BB dose; once intake improves will restart lasix, SYs BP >150 this am but generally in an acceptable range 5. Neuropsych: This patient is capable of making decisions on her own behalf. 6. Skin/Wound Care: Routine skin checks 7. Fluids/Electrolytes/Nutrition: Routine I&O with follow-up chemistries, meal intake 15-50%, Intake ~5037mfluid, IVF at noc, encourage daytime po intake, will try off IVF over weekend, check BMET on Monday 8. Systolic congestive heart failure. Check daily weights and I's and O's. Patient on Lasix 40 mg daily prior to admission. Resume as needed currently fluid intake very low , minimal pedal edema 9. Atrial fibrillation. Chronic Coumadin discontinued secondary to intracerebral hemorrhage. Cardiac rate controlled. Continue Cardizem and Lopressor 10. Diabetes mellitus peripheral neuropathy. Hemoglobin A1c 5.9. Sliding scale insulin. Check blood sugars before meals and at bedtime. Patient on Glucophage 500 mg twice a  day prior to admission.   -initiated glucophage 221m bid but CBG dropped, will hold for now, appetite is variable- currently CBGs have occ elevation -  11. Chronic renal insufficiency. Baseline creatinine 1.35. 1.13 most recently--- cont to push po fluids.IVF at noc,  12. Hypothyroidism. TSH level 2.053. Continue Synthroid 13. History of gout. Zyloprim 100 mg daily. Not active currently  14. Hyperlipidemia. Resume statin at discharge.  16. Urinary incontinence: timed voids. ucx proteus S to Keflex, finished treatment 17. Hyponatremia resolved  LOS (Days) 17 A FACE TO FACE EVALUATION WAS PERFORMED  Shakesha Soltau E 04/29/2015, 7:16 AM

## 2015-04-29 NOTE — Progress Notes (Signed)
Physical Therapy Session Note  Patient Details  Name: Nicole [name] MRN: [id number] Date of Birth: 1931/04/05  Today's Date: 04/29/2015 PT Individual Time: 0900-1000 PT Individual Time Calculation (min): 60 min   Short Term Goals: Week 3:  PT Short Term Goal 1 (Week 3): STGs  = LTGs due to ELOS  Skilled Therapeutic Interventions/Progress Updates:   Patient sitting in wheelchair, agreeable to participate in therapy after getting dressed. Patient donned pants with assist for threading BLE and sit <> stand from wheelchair with min A. Gait with min HHA mostly for initiation x 200 ft +75 ft + 125 ft and significantly slow speed with max multimodal cues for increased step length, increased gait speed, and attention to task. Patient maintained dynamic standing balance with supervision while retrieving towels from laundry basket on floor on patient's left side and folding towels meticulously using BUE x 10 min. Patient ambulated from gym to ADL apartment while carrying laundry basket using BUE with min A. Patient ambulated back to room with min HHA at slower pace than at beginning of session and left sitting in recliner with quick release belt on and all needs within reach.   Therapy Documentation Precautions:  Precautions Precautions: Fall Precaution Comments: L hemiplegia Restrictions Weight Bearing Restrictions: No Pain: Pain Assessment Pain Assessment: No/denies pain   See Function Navigator for Current Functional Status.   Therapy/Group: Individual Therapy  Laretta Alstrom 04/29/2015, 9:51 AM

## 2015-04-29 NOTE — Progress Notes (Signed)
Occupational Therapy Weekly Progress Note  Patient Details  Name: Nicole [name] MRN: [id number] Date of Birth: October 08, 1930  Beginning of progress report period: April 22, 2015 End of progress report period: April 29, 2015  Today's Date: 04/29/2015 OT Individual Time: 1100-1200 OT Individual Time Calculation (min): 60 min    Patient has met 3 of 4 short term goals.  Pt continues to improve performance with highly supervised BADL noting Improved attention, improved functional mobility, and improved gross/FMC of LUE during this week.   Pt remains dependent on caregiver to direct throughout session and assist with setup, problem-solving and steadying while rising to stand.   Pt requires constant cues for sequencing and is redirected without correction to inaccurate statement she makes regarding presence of family members or situation.  Patient continues to demonstrate the following deficits: Impaired cognition: awareness, memory, attention and therefore will continue to benefit from skilled OT intervention to enhance overall performance with BADL.  Patient progressing toward long term goals..  Continue plan of care.  OT Short Term Goals Week 1:  OT Short Term Goal 1 (Week 1): Pt will be able to transfer to toilet with min A. OT Short Term Goal 1 - Progress (Week 1): Progressing toward goal OT Short Term Goal 2 (Week 1): Pt will be able to toilet with min A. OT Short Term Goal 2 - Progress (Week 1): Progressing toward goal OT Short Term Goal 3 (Week 1): Pt will be able to don shirt with min A. OT Short Term Goal 3 - Progress (Week 1): Progressing toward goal OT Short Term Goal 4 (Week 1): Pt will don pants with mod A. OT Short Term Goal 4 - Progress (Week 1): Met OT Short Term Goal 5 (Week 1): Pt will actively use LUE to wash R arm with min cues. OT Short Term Goal 5 - Progress (Week 1): Met Week 2:  OT Short Term Goal 1 (Week 2): Pt will demo ability to lace left pant leg using AE  with min assist OT Short Term Goal 1 - Progress (Week 2): Not progressing OT Short Term Goal 2 (Week 2): Pt will demo ability to lace left shirt sleeve with vc to dress left first OT Short Term Goal 2 - Progress (Week 2): Met OT Short Term Goal 3 (Week 2): Pt will complete toilet hygiene standing supported with min assist for thoroughness OT Short Term Goal 3 - Progress (Week 2): Met OT Short Term Goal 4 (Week 2): Pt will groom (wash face, hands, brush teeth) standing at sink with sublte cues and setup assist. OT Short Term Goal 4 - Progress (Week 2): Met Week 3:  OT Short Term Goal 1 (Week 3): STG=LTG d/t short remaining LOS (transfer to SNF)  Skilled Therapeutic Interventions/Progress Updates: ADL-retraining with focus on improved dynamic standing, grooming, attention, awareness, lower body dressing (shoes) and orientation.   Pt received in her w/c awaiting therapist while holding onto call light as if it were a telephone.   Pt reported need to contact un-named female authority to set an appointment with him for unexpressed reason.   Pt redirected with subtle distracting cues but declined change of clothing or bathing.   Following setup assist to apply TEDs, pt agreed to fix her hair and groom at sink after several tangential discussions relating to past events while negotiating with caregiver.   Pt completed sit>stand with min assist and ambulated to sink w/o device although with therapist providing contact guard for safety.  Pt remained standing at sink for 10 min while combing her hair, applying lipstick, washing her face and hands.   Pt then reported fatigue and requested return to bed for rest prior to lunch.   Pt required extra time and setup assist to prepare bed, steadying assist during mobility and min assist to lift left leg into bed.   Pt completed bed mobility unassisted and remained in bed at end of session with call light and bed alarm activated.     Therapy Documentation Precautions:   Precautions Precautions: Fall Precaution Comments: L hemiplegia Restrictions Weight Bearing Restrictions: No   Pain: No/denies pain  See Function Navigator for Current Functional Status.   Therapy/Group: Individual Therapy  [place] 04/29/2015, 12:31 PM

## 2015-04-29 NOTE — Progress Notes (Signed)
Physical Therapy Session Note  Patient Details  Name: Nicole [name] MRN: [id number] Date of Birth: 06-02-1931  Today's Date: 04/29/2015 PT Individual Time: 1400-1500 PT Individual Time Calculation (min): 60 min   Short Term Goals: Week 3:  PT Short Term Goal 1 (Week 3): STGs  = LTGs due to ELOS  Skilled Therapeutic Interventions/Progress Updates:    Pt sitting up in w/c with quick release belt in place, pleasantly confused and focused on examining lunch receipt. Therapeutic Activity - Upon standing, pt reports she needs to go to the bathroom. See function tab for details. PT provides HHA with upright mobility throughout this activity for balance support. Gait Training - see function tab for details - pt intermittently furniture walks while also holding therapist's hand. Pt's gait speed is slow overall, but variable throughout gait. Neuromuscular Reeducation - PT instructs pt in reaching activities and dynamic standing balance of horseshoe toss req steadying assist - close SBA. Pt ended practicing wheeling w/c back to room, quick release belt in place, SO [name] agrees to let pt continue practice wheeling w/c and he will push her back to her room as needed. Pt is beginning to mobility without RW, req HHA for safety. Continue per PT POC.    Therapy Documentation Precautions:  Precautions Precautions: Fall Precaution Comments: L hemiplegia Restrictions Weight Bearing Restrictions: No Pain: Pain Assessment Pain Assessment: No/denies pain   See Function Navigator for Current Functional Status.   Therapy/Group: Individual Therapy  Sahas Sluka M 04/29/2015, 2:03 PM

## 2015-04-29 NOTE — Plan of Care (Signed)
Problem: RH KNOWLEDGE DEFICIT Goal: RH STG INCREASE KNOWLEDGE OF HYPERTENSION Able to verbalize s/s of hypo/hypertension, and demonstrate understanding of medication, treatment, and diet prescribed by MD with moderate assistance.  Outcome: Not Progressing Patient is alert oriented to self only.

## 2015-04-30 ENCOUNTER — Inpatient Hospital Stay (HOSPITAL_COMMUNITY): Payer: Medicare Other | Admitting: Physical Therapy

## 2015-04-30 LAB — GLUCOSE, CAPILLARY
GLUCOSE-CAPILLARY: 158 mg/dL — AB (ref 65–99)
GLUCOSE-CAPILLARY: 162 mg/dL — AB (ref 65–99)
Glucose-Capillary: 148 mg/dL — ABNORMAL HIGH (ref 65–99)
Glucose-Capillary: 159 mg/dL — ABNORMAL HIGH (ref 65–99)

## 2015-04-30 NOTE — Discharge Summary (Signed)
Nicole [name], BLUE NO.:  [id number]  MEDICAL RECORD NO.:  [id number]  LOCATION:                                 FACILITY:  PHYSICIAN:  Charlett Blake, M.D.DATE OF BIRTH:  28-Jan-1931  DATE OF ADMISSION:  04/08/2015 DATE OF DISCHARGE:  05/02/2015                              DISCHARGE SUMMARY   DISCHARGE DIAGNOSES: 1. Functional deficits secondary to right caudate intracerebral     hemorrhage secondary to hypertensive crisis.  Subcutaneous heparin     for deep vein thrombosis prophylaxis initiated on April 08, 2015. 2. Pain management. 3. Hypertension. 4. Systolic congestive heart failure. 5. Atrial fibrillation. 6. Diabetes mellitus, peripheral neuropathy. 7. Chronic renal insufficiency with baseline creatinine 1.35. 8. Hypothyroidism. 9. History of gout. 10.Hyperlipidemia.  HISTORY OF PRESENT ILLNESS:  This is an 81 year old right-handed female with history of hypertension, systolic congestive heart failure, diabetes mellitus, chronic renal insufficiency with creatinine 1.35, and atrial fibrillation, on chronic Coumadin.  Independent prior to admission, living alone.  Presented on April 04, 2015, with altered mental status, headache, blood pressure 166/103, INR 3.6.  CT of the head showed large acute hemorrhage centered in the region of the right caudate nucleus with rupture into the lateral ventricle, parenchymal hemorrhage measuring 3.2 x 4.6 x 3.0 cm.  Neurosurgery, Dr. Saintclair Halsted, consulted, advised conservative care.  Coumadin was reversed.  Followup cranial CT scan stable.  Echocardiogram with ejection fraction of 60%. Normal systolic function.  Maintained on a mechanical soft diet. Subcutaneous heparin for DVT prophylaxis initiated on April 08, 2015.  The patient was admitted for comprehensive rehab program.  PAST MEDICAL HISTORY:  See discharge diagnoses.  SOCIAL HISTORY:  Lives alone, independent, and active prior  to admission.  Functional status upon admission to rehab services was moderate assist to ambulate 50 feet with a rolling walker, min assist stand pivot transfers, min-to-mod assist with activities of daily living.  PHYSICAL EXAMINATION:  VITAL SIGNS:  Blood pressure 137/74, pulse 62, temperature 99, respirations 16. GENERAL:  This was an alert female, flat affect, but appropriate and good eye contact with examiner.  Fair awareness of deficit.  Followed commands. CARDIAC:  Rate irregularly irregular. ABDOMEN:  Soft, nontender.  Good bowel sounds. LUNGS:  Clear to auscultation.  REHABILITATION HOSPITAL COURSE:  The patient was admitted to inpatient rehab services with therapies initiated on a 3-hour daily basis consisting of physical therapy, occupational therapy, speech therapy, and rehabilitation nursing.  The following issues were addressed during the patient's rehabilitation stay.  Pertaining to Ms. Swaminathan's right caudate intracerebral hemorrhage conservative care, she would follow up with Neurosurgery.  She had been on subcutaneous heparin for DVT prophylaxis since April 08, 2015, no bleeding episodes.  Blood pressures controlled on clonidine, Cardizem, lisinopril, Lopressor, and close monitoring noted.  She exhibited no other signs of fluid overload. She had been on Lasix in the past.  This would be re-initiated after her p.o. intake improved which showed steady progress on dysphagia #1 thin liquid diet.  Blood sugars controlled, hemoglobin A1c of 5.9, attempts to resume her Glucophage at low-dose was held due to some hypoglycemia and monitored.  Chronic renal insufficiency, baseline  creatinine 1.35. She did initially receive some IV fluids.  The patient received weekly collaborative interdisciplinary team conferences to discuss estimated length of stay, family teaching, and any barriers to discharge.  She was ambulating 200 feet at a slow speed, cues for safety and step  length, maintained dynamic standing balance with supervision while retrieving towels in a laundry basket on the floor, the patient's left side.  She could ambulate to the gym while carrying her laundry basket.  Gather her belongings for activities of daily living and homemaking.  Due to limited assistance at home, it was felt skilled nursing facility was needed.  Bed becoming available on May 02, 2015.  DISCHARGE MEDICATIONS:  Allopurinol 100 mg p.o. daily, clonidine 0.2 mg p.o. t.i.d., Cardizem 60 mg p.o. q.i.d., Synthroid 50 mcg p.o. daily, lisinopril 20 mg p.o. b.i.d., Lopressor 50 mg p.o. b.i.d., Protonix 40 mg p.o. daily, potassium chloride 10 mEq p.o. b.i.d.  DIET:  Dysphagia #1 thin liquids.  FOLLOWUP:  The patient would follow up with Dr. Alysia Penna at the outpatient rehab center as per appointment; Dr. Kary Kos, Neurosurgery, call for appointment; Hollace Kinnier, DO, medical management.  SPECIAL INSTRUCTIONS:  No Coumadin therapy due to [place].     Nicole [name], P.A.   ______________________________ Charlett Blake, M.D.    DA/MEDQ  D:  04/29/2015  T:  04/30/2015  Job:  [id number]  cc:   Kary Kos, M.D. Charlett Blake, M.D. Hollace Kinnier, DO

## 2015-04-30 NOTE — Progress Notes (Signed)
Physical Therapy Session Note  Patient Details  Name: Nicole [name] MRN: [id number] Date of Birth: 1930/08/08  Today's Date: 04/30/2015 PT Individual Time: 1345-1430 PT Individual Time Calculation (min): 45 min   Short Term Goals: Week 3:  PT Short Term Goal 1 (Week 3): STGs  = LTGs due to ELOS  Skilled Therapeutic Interventions/Progress Updates:    Pt received up in w/c with quick release safety belt in place. Pt req encouragement to participate in therapy and frequent redirection at onset of therapy as pt is convinced she needs gloves, hat, and coat before leaving room. Pt's son Nicole [name] present throughout therapy session and assists in gentle redirection of pt. Gait Training - see function tab for details. Pt ambulates with larger step length and increased pace with son Nicole [name] ahead of her, encouraging her. Therapeutic Exercise - PT places pt on nustep exercise at L4 x 9 minutes for generalized strengthening. Therapeutic Activity - see function tab for sit to stands and transfer details. Pt is slowly continuing to progress. Pt thoroughly enjoyed nu-step activity. Continue per PT POC.   Therapy Documentation Precautions:  Precautions Precautions: Fall Precaution Comments: L hemiplegia Restrictions Weight Bearing Restrictions: No Pain: Pain Assessment Pain Assessment: No/denies pain   See Function Navigator for Current Functional Status.   Therapy/Group: Individual Therapy  [name] 04/30/2015, 2:25 PM

## 2015-04-30 NOTE — Progress Notes (Signed)
Nicole [name] is a 81 y.o. female 07/19/1931 [id number]  Subjective: No new complaints. No new problems. Slept well. Feeling OK.  Objective: Vital signs in last 24 hours: Temp:  [98.4 F (36.9 C)-99.1 F (37.3 C)] 99.1 F (37.3 C) (10/08 0525) Pulse Rate:  [73-98] 73 (10/08 0908) Resp:  [18-22] 18 (10/08 0525) BP: (148-177)/(83-102) 175/90 mmHg (10/08 0908) SpO2:  [95 %-98 %] 95 % (10/08 0525) Weight change:  Last BM Date: 04/29/15  Intake/Output from previous day: 10/07 0701 - 10/08 0700 In: 360 [P.O.:360] Out: -  Last cbgs: CBG (last 3)   Recent Labs  04/29/15 1636 04/29/15 2101 04/30/15 0642  GLUCAP 111* 134* 158*     Physical Exam General: No apparent distress  Eating breakfast HEENT: not dry Lungs: Normal effort. Lungs clear to auscultation, no crackles or wheezes. Cardiovascular: Regular rate and rhythm, no edema Abdomen: S/NT/ND; BS(+) Musculoskeletal:  unchanged Neurological: No new neurological deficits Wounds: N/A    Skin: clear  Aging changes Mental state: Alert, oriented, cooperative    Lab Results: BMET    Component Value Date/Time   NA 135 04/27/2015 0936   NA 145* 02/17/2015 0810   K 4.3 04/27/2015 0936   CL 101 04/27/2015 0936   CO2 26 04/27/2015 0936   GLUCOSE 240* 04/27/2015 0936   GLUCOSE 54* 02/17/2015 0810   BUN 8 04/27/2015 0936   BUN 12 02/17/2015 0810   CREATININE 1.13* 04/27/2015 0936   CALCIUM 9.1 04/27/2015 0936   GFRNONAA 44* 04/27/2015 0936   GFRAA 51* 04/27/2015 0936   CBC    Component Value Date/Time   WBC 8.7 04/13/2015 0546   WBC 6.1 02/17/2015 0810   WBC 7.6 08/14/2012 1014   RBC 4.47 04/13/2015 0546   RBC 4.73 02/17/2015 0810   RBC 4.79 08/14/2012 1014   HGB 11.7* 04/13/2015 0546   HGB 12.2 08/14/2012 1014   HCT 34.2* 04/13/2015 0546   HCT 39.5 02/17/2015 0810   HCT 37.2 08/14/2012 1014   PLT 297 04/13/2015 0546   PLT 286 08/14/2012 1014   MCV 76.5* 04/13/2015 0546   MCV 77.6* 08/14/2012 1014   MCH 26.2 04/13/2015 0546   MCH 26.4* 02/17/2015 0810   MCH 25.6 08/14/2012 1014   MCHC 34.2 04/13/2015 0546   MCHC 31.6 02/17/2015 0810   MCHC 32.9 08/14/2012 1014   RDW 17.1* 04/13/2015 0546   RDW 18.6* 02/17/2015 0810   RDW 20.2* 08/14/2012 1014   LYMPHSABS 1.0 04/13/2015 0546   LYMPHSABS 1.2 02/17/2015 0810   LYMPHSABS 2.3 08/14/2012 1014   MONOABS 1.0 04/13/2015 0546   MONOABS 0.7 08/14/2012 1014   EOSABS 0.0 04/13/2015 0546   EOSABS 0.1 10/12/2014 0819   BASOSABS 0.0 04/13/2015 0546   BASOSABS 0.0 02/17/2015 0810   BASOSABS 0.0 08/14/2012 1014    Studies/Results: No results found.  Medications: I have reviewed the patient's current medications.  Assessment/Plan:  1. Functional deficits secondary to right caudate intercerebral hemorrhage secondary to hypertensive crisis 2. DVT Prophylaxis/Anticoagulation: Subcutaneous heparin added for DVT prophylaxis 04/08/2015, change to lovenox for improved efficacy and reduced needle sticks 3. 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 Hypertension. Clonidine 0.2 mg 3 times a day, Cardizem 240 mg daily, changed to 60mg  q6 on 10/1 due to the necessity of crushing medication, lisinopril 20 mg twice a  day, Lopressor 25 mg twice a day. Will maintain BB dose; once intake improves will restart lasix, SYs BP >150 this am but generally in an acceptable range 5. Neuropsych: This patient is capable of making decisions on her own behalf. 6. Skin/Wound Care: Routine skin checks 7. Fluids/Electrolytes/Nutrition: Routine I&O with follow-up chemistries, meal intake 15-50%, Intake ~542ml fluid, IVF at noc, encourage daytime po intake, will try off IVF over weekend, check BMET on Monday 8. Systolic congestive heart failure. Check daily weights and I's and O's.  Patient on Lasix 40 mg daily prior to admission. Resume as needed currently fluid intake very low , minimal pedal edema 9. Atrial fibrillation. Chronic Coumadin discontinued secondary to intracerebral hemorrhage. Cardiac rate controlled. Continue Cardizem and Lopressor 10. Diabetes mellitus peripheral neuropathy. Hemoglobin A1c 5.9. Sliding scale insulin. Check blood sugars before meals and at bedtime. Patient on Glucophage 500 mg twice a day prior to admission.  -initiated glucophage 250mg  bid but CBG dropped, will hold for now, appetite is variable- currently CBGs have occ elevation-  11. Chronic renal insufficiency. Baseline creatinine 1.35. 1.13 most recently--- cont to push po fluids.IVF at noc,  12. Hypothyroidism. TSH level 2.053. Continue Synthroid 13. History of gout. Zyloprim 100 mg daily. Not active currently  14. Hyperlipidemia. Resume statin at discharge.  16. Urinary incontinence: timed voids. ucx proteus S to Keflex, finished treatment    Length of stay, days: 18  Walker Kehr , MD 04/30/2015, 9:39 AM

## 2015-04-30 NOTE — Progress Notes (Signed)
04/30/15 [place], patient's RN from yesterday called re: follow up flu shot. RN spoke to patient's son  today and said that patient got her flu shot around the week of  September 12-15 claims" one of those days she got the flu shot."

## 2015-05-01 ENCOUNTER — Inpatient Hospital Stay (HOSPITAL_COMMUNITY): Payer: Medicare Other | Admitting: Physical Therapy

## 2015-05-01 LAB — GLUCOSE, CAPILLARY
GLUCOSE-CAPILLARY: 130 mg/dL — AB (ref 65–99)
GLUCOSE-CAPILLARY: 108 mg/dL — AB (ref 65–99)
Glucose-Capillary: 185 mg/dL — ABNORMAL HIGH (ref 65–99)
Glucose-Capillary: 150 mg/dL — ABNORMAL HIGH (ref 65–99)

## 2015-05-01 MED ORDER — INFLUENZA VAC SPLIT QUAD 0.5 ML IM SUSY
0.5000 mL | PREFILLED_SYRINGE | INTRAMUSCULAR | Status: AC
  Administered 2015-05-02: 0.5 mL via INTRAMUSCULAR
  Filled 2015-05-01: qty 0.5

## 2015-05-01 NOTE — Progress Notes (Signed)
Physical Therapy Session Note  Patient Details  Name: Nicole [name] MRN: [id number] Date of Birth: 1931/05/24  Today's Date: 05/01/2015 PT Individual Time: 0900-0945 PT Individual Time Calculation (min): 45 min   Short Term Goals: Week 1:  PT Short Term Goal 1 (Week 1): Pt will demonstrate side lie to sit req mod A consistently.  PT Short Term Goal 1 - Progress (Week 1): Met PT Short Term Goal 2 (Week 1): Pt will demonstrate sit to stand req min A.  PT Short Term Goal 2 - Progress (Week 1): Progressing toward goal PT Short Term Goal 3 (Week 1): Pt will demonstrate stand-step transfer req mod A.  PT Short Term Goal 3 - Progress (Week 1): Met PT Short Term Goal 4 (Week 1): Pt will ambulate 100' req mod A with RW PT Short Term Goal 4 - Progress (Week 1): Met PT Short Term Goal 5 (Week 1): Pt will demonstrate w/c propulsion x 50' req max A.  PT Short Term Goal 5 - Progress (Week 1): Not met Week 2:  PT Short Term Goal 1 (Week 2): Pt will demonstrate side lie to sit req min A.  PT Short Term Goal 1 - Progress (Week 2): Met PT Short Term Goal 2 (Week 2): Pt will demonstrate sit to stand req min A.  PT Short Term Goal 2 - Progress (Week 2): Progressing toward goal (min A from w/c, but mod A from eob) PT Short Term Goal 3 (Week 2): Pt will demonstrate sit to side lie req min A consistently.  PT Short Term Goal 3 - Progress (Week 2): Met PT Short Term Goal 4 (Week 2): Pt will consistently ambulate >= 150' with RW req min A.  PT Short Term Goal 4 - Progress (Week 2): Met (when given increased tme to complete task) PT Short Term Goal 5 (Week 2): Pt will ambulate up/down 3 steps with B rails req min A.  PT Short Term Goal 5 - Progress (Week 2): Met Week 3:  PT Short Term Goal 1 (Week 3): STGs  = LTGs due to ELOS  Skilled Therapeutic Interventions/Progress Updates:   Pt limited by cognitive deficits including apraxia in session. Pt attempting to brush teeth with lotion then chap stick in  session (therapist prevents this from happening). Pt also keep turning water off then back on to wash hands again after just washing them. Pt requires constant redirection and behavior management to terminate these behaviors. Pt would continue to benefit from skilled PT services to increase functional mobility.  Therapy Documentation Precautions:  Precautions Precautions: Fall Precaution Comments: L hemiplegia Restrictions Weight Bearing Restrictions: No Pain: Pain Assessment Pain Assessment: No/denies pain Mobility:  Mod A a t times with cues for weight shift and technique Locomotion :  Gait 200' SBA with cues for safety and attention  Other Treatments:  Pt educated on rehab plan, safety in mobility, sequencing functional tasks, and problem solving. Pt performs static and dynamic sitting and standing balance with dual motor activities within functional context including weight shifting, reaching, grasping, cog tasks, and LE manipulation. Transfers x10 in session. Pt behavior managed with decreased stim, redirection, education, decreased demands, and increased time to perform activities.    See Function Navigator for Current Functional Status.   Therapy/Group: Individual Therapy  Monia Pouch 05/01/2015, 9:21 AM

## 2015-05-01 NOTE — Progress Notes (Signed)
05/01/15 1401 nursing Per pharmacy patient did not receive her flu shot this year. Per patients son he wants her mom to get the flu shot. Order for flu shot done.

## 2015-05-01 NOTE — Progress Notes (Signed)
Nicole [name] is a 81 y.o. female 06-Dec-1930 [id number]  Subjective: No new complaints. No new problems. Slept well. Feeling OK.  Objective: Vital signs in last 24 hours: Temp:  [98.2 F (36.8 C)-98.6 F (37 C)] 98.2 F (36.8 C) (10/09 0508) Pulse Rate:  [66-96] 66 (10/09 0508) Resp:  [18] 18 (10/09 0508) BP: (147-169)/(76-86) 161/86 mmHg (10/09 0508) SpO2:  [98 %-100 %] 98 % (10/09 0508) Weight change:  Last BM Date: 04/30/15  Intake/Output from previous day: 10/08 0701 - 10/09 0700 In: 480 [P.O.:480] Out: -  Last cbgs: CBG (last 3)   Recent Labs  04/30/15 2116 05/01/15 0641 05/01/15 1132  GLUCAP 159* 130* 185*     Physical Exam General: No apparent distress  Eating breakfast HEENT: not dry Lungs: Normal effort. Lungs clear to auscultation, no crackles or wheezes. Cardiovascular: Regular rate and rhythm, no edema Abdomen: S/NT/ND; BS(+) Musculoskeletal:  unchanged Neurological: No new neurological deficits Wounds: N/A    Skin: clear  Aging changes Mental state: Alert, oriented, cooperative    Lab Results: BMET    Component Value Date/Time   NA 135 04/27/2015 0936   NA 145* 02/17/2015 0810   K 4.3 04/27/2015 0936   CL 101 04/27/2015 0936   CO2 26 04/27/2015 0936   GLUCOSE 240* 04/27/2015 0936   GLUCOSE 54* 02/17/2015 0810   BUN 8 04/27/2015 0936   BUN 12 02/17/2015 0810   CREATININE 1.13* 04/27/2015 0936   CALCIUM 9.1 04/27/2015 0936   GFRNONAA 44* 04/27/2015 0936   GFRAA 51* 04/27/2015 0936   CBC    Component Value Date/Time   WBC 8.7 04/13/2015 0546   WBC 6.1 02/17/2015 0810   WBC 7.6 08/14/2012 1014   RBC 4.47 04/13/2015 0546   RBC 4.73 02/17/2015 0810   RBC 4.79 08/14/2012 1014   HGB 11.7* 04/13/2015 0546   HGB 12.2 08/14/2012 1014   HCT 34.2* 04/13/2015 0546   HCT 39.5 02/17/2015 0810   HCT 37.2 08/14/2012 1014   PLT 297 04/13/2015 0546   PLT 286 08/14/2012 1014   MCV 76.5* 04/13/2015 0546   MCV 77.6* 08/14/2012 1014   MCH  26.2 04/13/2015 0546   MCH 26.4* 02/17/2015 0810   MCH 25.6 08/14/2012 1014   MCHC 34.2 04/13/2015 0546   MCHC 31.6 02/17/2015 0810   MCHC 32.9 08/14/2012 1014   RDW 17.1* 04/13/2015 0546   RDW 18.6* 02/17/2015 0810   RDW 20.2* 08/14/2012 1014   LYMPHSABS 1.0 04/13/2015 0546   LYMPHSABS 1.2 02/17/2015 0810   LYMPHSABS 2.3 08/14/2012 1014   MONOABS 1.0 04/13/2015 0546   MONOABS 0.7 08/14/2012 1014   EOSABS 0.0 04/13/2015 0546   EOSABS 0.1 10/12/2014 0819   BASOSABS 0.0 04/13/2015 0546   BASOSABS 0.0 02/17/2015 0810   BASOSABS 0.0 08/14/2012 1014    Studies/Results: No results found.  Medications: I have reviewed the patient's current medications.  Assessment/Plan:  1. Functional deficits secondary to right caudate intercerebral hemorrhage secondary to hypertensive crisis 2. DVT Prophylaxis/Anticoagulation: Subcutaneous heparin added for DVT prophylaxis 04/08/2015, change to lovenox for improved efficacy and reduced needle sticks 3. 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 Hypertension. Clonidine 0.2 mg 3 times a day, Cardizem 240 mg daily, changed to 60mg  q6 on 10/1 due to the necessity of crushing medication, lisinopril 20 mg twice  a day, Lopressor 25 mg twice a day. Will maintain BB dose; once intake improves will restart lasix, SYs BP >150 this am but generally in an acceptable range 5. Neuropsych: This patient is capable of making decisions on her own behalf. 6. Skin/Wound Care: Routine skin checks 7. Fluids/Electrolytes/Nutrition: Routine I&O with follow-up chemistries, meal intake 15-50%, Intake ~511ml fluid, IVF at noc, encourage daytime po intake, will try off IVF over weekend, check BMET on Monday 8. Systolic congestive heart failure. Check daily weights and I's and O's. Patient  on Lasix 40 mg daily prior to admission. Resume as needed currently fluid intake very low , minimal pedal edema 9. Atrial fibrillation. Chronic Coumadin discontinued secondary to intracerebral hemorrhage. Cardiac rate controlled. Continue Cardizem and Lopressor 10. Diabetes mellitus peripheral neuropathy. Hemoglobin A1c 5.9. Sliding scale insulin. Check blood sugars before meals and at bedtime. Patient on Glucophage 500 mg twice a day prior to admission.  -initiated glucophage 250mg  bid but CBG dropped, will hold for now, appetite is variable- currently CBGs have occ elevation-  11. Chronic renal insufficiency. Baseline creatinine 1.35. 1.13 most recently--- cont to push po fluids.IVF at noc,  12. Hypothyroidism. TSH level 2.053. Continue Synthroid 13. History of gout. Zyloprim 100 mg daily. Not active currently  14. Hyperlipidemia. Resume statin at discharge. 16. Urinary incontinence: timed voids.   Length of stay, days: [place] , MD 05/01/2015, 1:13 PM

## 2015-05-02 ENCOUNTER — Inpatient Hospital Stay (HOSPITAL_COMMUNITY): Payer: Medicare Other | Admitting: Physical Therapy

## 2015-05-02 ENCOUNTER — Ambulatory Visit (HOSPITAL_COMMUNITY): Payer: Medicare Other | Admitting: Speech Pathology

## 2015-05-02 ENCOUNTER — Inpatient Hospital Stay (HOSPITAL_COMMUNITY): Payer: Medicare Other

## 2015-05-02 DIAGNOSIS — M109 Gout, unspecified: Secondary | ICD-10-CM | POA: Diagnosis not present

## 2015-05-02 DIAGNOSIS — R4189 Other symptoms and signs involving cognitive functions and awareness: Secondary | ICD-10-CM | POA: Diagnosis not present

## 2015-05-02 DIAGNOSIS — R1310 Dysphagia, unspecified: Secondary | ICD-10-CM | POA: Diagnosis not present

## 2015-05-02 DIAGNOSIS — E039 Hypothyroidism, unspecified: Secondary | ICD-10-CM | POA: Diagnosis not present

## 2015-05-02 DIAGNOSIS — R32 Unspecified urinary incontinence: Secondary | ICD-10-CM | POA: Diagnosis not present

## 2015-05-02 DIAGNOSIS — G629 Polyneuropathy, unspecified: Secondary | ICD-10-CM | POA: Diagnosis not present

## 2015-05-02 DIAGNOSIS — I10 Essential (primary) hypertension: Secondary | ICD-10-CM | POA: Diagnosis not present

## 2015-05-02 DIAGNOSIS — E1142 Type 2 diabetes mellitus with diabetic polyneuropathy: Secondary | ICD-10-CM | POA: Diagnosis not present

## 2015-05-02 DIAGNOSIS — E119 Type 2 diabetes mellitus without complications: Secondary | ICD-10-CM | POA: Diagnosis not present

## 2015-05-02 DIAGNOSIS — I5032 Chronic diastolic (congestive) heart failure: Secondary | ICD-10-CM | POA: Diagnosis not present

## 2015-05-02 DIAGNOSIS — I613 Nontraumatic intracerebral hemorrhage in brain stem: Secondary | ICD-10-CM | POA: Diagnosis not present

## 2015-05-02 DIAGNOSIS — I619 Nontraumatic intracerebral hemorrhage, unspecified: Secondary | ICD-10-CM | POA: Diagnosis not present

## 2015-05-02 DIAGNOSIS — I482 Chronic atrial fibrillation: Secondary | ICD-10-CM | POA: Diagnosis not present

## 2015-05-02 DIAGNOSIS — E871 Hypo-osmolality and hyponatremia: Secondary | ICD-10-CM | POA: Diagnosis not present

## 2015-05-02 DIAGNOSIS — I129 Hypertensive chronic kidney disease with stage 1 through stage 4 chronic kidney disease, or unspecified chronic kidney disease: Secondary | ICD-10-CM | POA: Diagnosis not present

## 2015-05-02 DIAGNOSIS — G8194 Hemiplegia, unspecified affecting left nondominant side: Secondary | ICD-10-CM | POA: Diagnosis not present

## 2015-05-02 DIAGNOSIS — E876 Hypokalemia: Secondary | ICD-10-CM | POA: Diagnosis not present

## 2015-05-02 DIAGNOSIS — K219 Gastro-esophageal reflux disease without esophagitis: Secondary | ICD-10-CM | POA: Diagnosis not present

## 2015-05-02 DIAGNOSIS — R278 Other lack of coordination: Secondary | ICD-10-CM | POA: Diagnosis not present

## 2015-05-02 DIAGNOSIS — R41841 Cognitive communication deficit: Secondary | ICD-10-CM | POA: Diagnosis not present

## 2015-05-02 DIAGNOSIS — I481 Persistent atrial fibrillation: Secondary | ICD-10-CM | POA: Diagnosis not present

## 2015-05-02 DIAGNOSIS — R262 Difficulty in walking, not elsewhere classified: Secondary | ICD-10-CM | POA: Diagnosis not present

## 2015-05-02 DIAGNOSIS — I5030 Unspecified diastolic (congestive) heart failure: Secondary | ICD-10-CM | POA: Diagnosis not present

## 2015-05-02 DIAGNOSIS — I610 Nontraumatic intracerebral hemorrhage in hemisphere, subcortical: Secondary | ICD-10-CM | POA: Diagnosis not present

## 2015-05-02 DIAGNOSIS — M6281 Muscle weakness (generalized): Secondary | ICD-10-CM | POA: Diagnosis not present

## 2015-05-02 DIAGNOSIS — I69121 Dysphasia following nontraumatic intracerebral hemorrhage: Secondary | ICD-10-CM | POA: Diagnosis not present

## 2015-05-02 DIAGNOSIS — D508 Other iron deficiency anemias: Secondary | ICD-10-CM | POA: Diagnosis not present

## 2015-05-02 DIAGNOSIS — E11649 Type 2 diabetes mellitus with hypoglycemia without coma: Secondary | ICD-10-CM | POA: Diagnosis not present

## 2015-05-02 DIAGNOSIS — I4891 Unspecified atrial fibrillation: Secondary | ICD-10-CM | POA: Diagnosis not present

## 2015-05-02 DIAGNOSIS — I5022 Chronic systolic (congestive) heart failure: Secondary | ICD-10-CM | POA: Diagnosis not present

## 2015-05-02 DIAGNOSIS — N189 Chronic kidney disease, unspecified: Secondary | ICD-10-CM | POA: Diagnosis not present

## 2015-05-02 DIAGNOSIS — E1122 Type 2 diabetes mellitus with diabetic chronic kidney disease: Secondary | ICD-10-CM | POA: Diagnosis not present

## 2015-05-02 DIAGNOSIS — R531 Weakness: Secondary | ICD-10-CM | POA: Diagnosis not present

## 2015-05-02 DIAGNOSIS — R1312 Dysphagia, oropharyngeal phase: Secondary | ICD-10-CM | POA: Diagnosis not present

## 2015-05-02 DIAGNOSIS — E785 Hyperlipidemia, unspecified: Secondary | ICD-10-CM | POA: Diagnosis not present

## 2015-05-02 LAB — BASIC METABOLIC PANEL
Anion gap: 9 (ref 5–15)
BUN: 9 mg/dL (ref 6–20)
CALCIUM: 8.9 mg/dL (ref 8.9–10.3)
CHLORIDE: 101 mmol/L (ref 101–111)
CO2: 25 mmol/L (ref 22–32)
CREATININE: 1.23 mg/dL — AB (ref 0.44–1.00)
GFR calc Af Amer: 46 mL/min — ABNORMAL LOW (ref >60)
GFR calc non Af Amer: 40 mL/min — ABNORMAL LOW (ref >60)
GLUCOSE: 150 mg/dL — AB (ref 65–99)
Potassium: 4.3 mmol/L (ref 3.5–5.1)
Sodium: 135 mmol/L (ref 135–145)

## 2015-05-02 LAB — GLUCOSE, CAPILLARY
GLUCOSE-CAPILLARY: 163 mg/dL — AB (ref 65–99)
Glucose-Capillary: 135 mg/dL — ABNORMAL HIGH (ref 65–99)

## 2015-05-02 NOTE — Progress Notes (Signed)
Speech Language Pathology Discharge Summary  Patient Details  Name: Nicole [name] MRN: [id number] Date of Birth: 04/04/1931  Today's Date: 05/02/2015 SLP Individual Time: 0800-0853 SLP Individual Time Calculation (min): 53 min   Skilled Therapeutic Interventions: Skilled treatment session focused on cognitive goals. SLP facilitated session by providing extra time and Max A verbal cues for sequencing with task. Patient answered basic yes/no questions with 100% accuracy and complex yes/no questions with 80% accuracy. She also followed 1 and 2 step commands with 100% accuracy with repetition and 3 step commands with 66% accuracy. Patient named functional items and completed responsive naming tasks with 100% accuracy and required Max A to complete convergent and divergent language tasks as well as category exclusion tasks. Patient's family present throughout the session and patient was able to name all family members appropriately and attend to all tasks with Min A verbal cues for redirection. Patient left upright in wheelchair with family present.   Patient has met 0 of 7 long term goals.  Patient to discharge at overall Max level.   Reasons goals not met: Patient continues to require overall Max A to complete tasks safely in regards to swallowing and cognitive function    Clinical Impression/Discharge Summary: Patient has made minimal and inconsistent goals and has not met any LTG's this admission. However, patient demonstrates increased sustained attention, initiation, ability to follow multi-step commands and maintain topic of conversation. Patient is consuming Dys. 1 textures with thin liquids with intermittent overt s/s of aspiration and continues to require Max A verbal cues for use of swallowing compensatory strategies. Patient also requires overall Max-Total A multimodal cues to complete functional and familiar tasks safely in regards to cognitive function. The patient's family is unable to  provide the necessary assistance needed at this time, therefore, patient will discharge to a SNF and would benefit from skilled SLP intervention to maximize her swallowing and cognitive function and overall functional independence.   Care Partner:  Caregiver Able to Provide Assistance: No  Type of Caregiver Assistance: Physical;Cognitive  Recommendation:  Skilled Nursing facility;24 hour supervision/assistance  Rationale for SLP Follow Up: Maximize cognitive function and independence;Maximize swallowing safety;Reduce caregiver burden;Maximize functional communication   Equipment: N/A   Reasons for discharge: Discharged from hospital   Patient/Family Agrees with Progress Made and Goals Achieved: Yes   Function:   Cognition Comprehension Comprehension assist level: Understands basic 75 - 89% of the time/ requires cueing 10 - 24% of the time  Expression   Expression assist level: Expresses basic 25 - 49% of the time/requires cueing 50 - 75% of the time. Uses single words/gestures.  Social Interaction Social Interaction assist level: Interacts appropriately 25 - 49% of time - Needs frequent redirection.  Problem Solving Problem solving assist level: Solves basic 25 - 49% of the time - needs direction more than half the time to initiate, plan or complete simple activities  Memory Memory assist level: Recognizes or recalls less than 25% of the time/requires cueing greater than 75% of the time   Nilton Lave 05/02/2015, 3:51 PM

## 2015-05-02 NOTE — Progress Notes (Signed)
Physical Therapy Discharge Summary  Patient Details  Name: Nicole [name] MRN: [id number] Date of Birth: 08-Apr-1931  Today's Date: 05/02/2015 PT Individual Time: 1030-1127 PT Individual Time Calculation (min): 57 min    Patient has met 5 of 7 long term goals due to improved activity tolerance, improved balance, improved postural control, increased strength, ability to compensate for deficits, functional use of  left upper extremity and left lower extremity and improved attention.  Patient to discharge at an ambulatory level [place].   Patient's care partner unable to provide the necessary 24/7 physical and cognitive assistance at discharge, therefore pt to D/C to SNF for more long term rehabilitation to reach higher level of functional mobility independence prior to D/C home with family.  Reasons goals not met: Pt did not meet dynamic standing balance of supervision; able to maintain static standing balance without UE support with supervision but required min guard during dynamic activities for safety and balance.  Did not reach 100' w/c mobility; did reach supervision level but distance remained under 100' due to very slow, inefficient propulsion.  Recommendation:  Patient will benefit from ongoing skilled PT services in skilled nursing facility setting to continue to advance safe functional mobility, address ongoing impairments in L hemiparesis with L inattention, apraxia with impaired motor planning and sequencing, impaired postural control, balance, gait, impaired cognition, and minimize fall risk.  Equipment: Deferred to next venue of care  Reasons for discharge: treatment goals met and discharge from hospital  Patient/family agrees with progress made and goals achieved: Yes  PT Discharge  Pt received at doorway up walking with sister's assistance at gait belt and with RW; sisters report that pt removed quick release belt and got up on her own.  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  Continued to gym with extra time and encouragement.  In gym pt negotiated 12 stairs with 2 rails with supervision.  Also performed car transfer stand pivot with supervision and no AD.  Transitioned to ADL apartment where pt demonstrated safe stand pivot chair < >bed with supervision and performed sit <> supine and rolling on flat bed supervision.  Transitioned to big gym where pt performed ambulation over compliant surface while holding basketball and performed dynamic standing balance training with basketball passes, dribbling during gait, reaching to ground to pick up ball, rotation and shooting to goal across midline all with min A.  Performed w/c mobility x 50' in controlled environment with pt performing with bilat UE and LE but at very slow, inefficient speed.  Returned to room in w/c total A.  Educated sisters on need for formal education on ambulating with pt to be checked off on it and recommendation to call staff if pt removes belt.  Sister's verbalized agreement.  Pt left in w/c with quick release belt and sisters present to supervise.   Pain Pain Assessment Pain Assessment: No/denies pain Pain Score: 0-No pain  Cognition Orientation Level: Oriented to person;Disoriented to place;Disoriented to time;Disoriented to situation Sensation Sensation Light Touch: Appears Intact Stereognosis: Not tested Hot/Cold: Not tested Proprioception: Not tested Coordination Gross Motor Movements are Fluid and Coordinated: Not tested Motor  Motor Motor: Motor apraxia;Abnormal postural alignment and control;Hemiplegia Motor - Discharge Observations: L hemiparesis, apraxia and impaired postural control-decreased weight shift to R  Mobility Bed  Mobility Bed Mobility: Rolling Right;Rolling Left;Supine to Sit;Sit to  Supine Rolling Right: 5: Supervision Rolling Left: 5: Supervision Supine to Sit: 5: Supervision;HOB flat Sit to Supine: 5: Supervision;HOB flat Transfers Stand Pivot Transfers: 5: Supervision Locomotion  Ambulation Ambulation/Gait Assistance: 4: Min guard (no AD; supervision with RW) Ambulation Distance (Feet): 200 Feet Assistive device: Rolling walker;None Gait Gait Pattern: Impaired Gait Pattern: Step-to pattern;Decreased step length - right;Decreased step length - left;Decreased stride length;Decreased weight shift to right;Trunk flexed;Decreased trunk rotation;Narrow base of support Stairs / Additional Locomotion Stairs: Yes Stairs Assistance: 5: Supervision Stair Management Technique: Two rails;Alternating pattern;Step to pattern;Forwards Number of Stairs: 12 Height of Stairs: 6 Wheelchair Mobility Wheelchair Mobility: Yes Wheelchair Assistance: 5: Careers information officer: Both upper extremities;Both lower extermities Wheelchair Parts Management: Needs assistance Distance: 50'; extremely slow and inefficient  Trunk/Postural Assessment  Postural Control Postural Control: Deficits on evaluation (impaired weight shifting and balance reactions)  Balance Static Sitting Balance Static Sitting - Balance Support: No upper extremity supported Static Sitting - Level of Assistance: 6: Modified independent (Device/Increase time) Dynamic Sitting Balance Dynamic Sitting - Balance Support: Right upper extremity supported;Left upper extremity supported Dynamic Sitting - Level of Assistance: 5: Stand by assistance Static Standing Balance Static Standing - Balance Support: No upper extremity supported Static Standing - Level of Assistance: 5: Stand by assistance Dynamic Standing Balance Dynamic Standing - Balance Support: No upper extremity supported Dynamic Standing - Level of Assistance: 4: Min assist Dynamic Standing - Balance Activities: Reaching for objects;[place];Reaching across midline;Other (comment) Dynamic Standing - Comments: basketball Extremity Assessment  RLE Assessment RLE Assessment: Within Functional Limits LLE Strength LLE Overall Strength: Deficits LLE Overall Strength Comments: hip flexion improved to 4-/5 and knee 5/5, ankle 3-4/5   See Function Navigator for Current Functional Status.  Raylene Everts [place]ette 05/02/2015, 1:19 PM

## 2015-05-02 NOTE — Plan of Care (Signed)
Problem: RH Balance Goal: LTG Patient will maintain dynamic standing balance (PT) LTG: Patient will maintain dynamic standing balance with assistance during mobility activities (PT)  Outcome: Not Met (add Reason) Requires min A for dynamic standing balance without UE support

## 2015-05-02 NOTE — Progress Notes (Signed)
Patient discharged to [facility].  Left floor via stretcher, escorted by EMS staff and family.  Report previously called to [facility] at [facility].  All patient belongings sent with patient.  Appears to be in no immediate distress at this time.  Brita Romp, RN

## 2015-05-02 NOTE — Progress Notes (Signed)
Nicole [name] is a 81 y.o. female 03-Oct-1930 [id number]  Subjective: No new complaints. No new problems. Slept well. Feeling OK.  Objective: Vital signs in last 24 hours: Temp:  [97.7 F (36.5 C)-99.2 F (37.3 C)] 99.2 F (37.3 C) (10/10 0541) Pulse Rate:  [47-63] 55 (10/10 0541) Resp:  [17-18] 18 (10/10 0541) BP: (126-155)/(60-85) 148/85 mmHg (10/10 0541) SpO2:  [97 %-100 %] 97 % (10/10 0541) Weight change:  Last BM Date: 04/30/15  Intake/Output from previous day: 10/09 0701 - 10/10 0700 In: 240 [P.O.:240] Out: -  Last cbgs: CBG (last 3)   Recent Labs  05/01/15 1627 05/01/15 2108 05/02/15 0632  GLUCAP 108* 150* 135*     Physical Exam General: No apparent distress  Eating breakfast HEENT: not dry Lungs: Normal effort. Lungs clear to auscultation, no crackles or wheezes. Cardiovascular: Regular rate and rhythm, no edema Abdomen: S/NT/ND; BS(+) Musculoskeletal:  unchanged Neurological: No new neurological deficits Wounds: N/A    Skin: clear  Aging changes Mental state: Alert, oriented, cooperative    Lab Results: BMET    Component Value Date/Time   NA 135 05/02/2015 0624   NA 145* 02/17/2015 0810   K 4.3 05/02/2015 0624   CL 101 05/02/2015 0624   CO2 25 05/02/2015 0624   GLUCOSE 150* 05/02/2015 0624   GLUCOSE 54* 02/17/2015 0810   BUN 9 05/02/2015 0624   BUN 12 02/17/2015 0810   CREATININE 1.23* 05/02/2015 0624   CALCIUM 8.9 05/02/2015 0624   GFRNONAA 40* 05/02/2015 0624   GFRAA 46* 05/02/2015 0624   CBC    Component Value Date/Time   WBC 8.7 04/13/2015 0546   WBC 6.1 02/17/2015 0810   WBC 7.6 08/14/2012 1014   RBC 4.47 04/13/2015 0546   RBC 4.73 02/17/2015 0810   RBC 4.79 08/14/2012 1014   HGB 11.7* 04/13/2015 0546   HGB 12.2 08/14/2012 1014   HCT 34.2* 04/13/2015 0546   HCT 39.5 02/17/2015 0810   HCT 37.2 08/14/2012 1014   PLT 297 04/13/2015 0546   PLT 286 08/14/2012 1014   MCV 76.5* 04/13/2015 0546   MCV 77.6* 08/14/2012 1014   MCH 26.2 04/13/2015 0546   MCH 26.4* 02/17/2015 0810   MCH 25.6 08/14/2012 1014   MCHC 34.2 04/13/2015 0546   MCHC 31.6 02/17/2015 0810   MCHC 32.9 08/14/2012 1014   RDW 17.1* 04/13/2015 0546   RDW 18.6* 02/17/2015 0810   RDW 20.2* 08/14/2012 1014   LYMPHSABS 1.0 04/13/2015 0546   LYMPHSABS 1.2 02/17/2015 0810   LYMPHSABS 2.3 08/14/2012 1014   MONOABS 1.0 04/13/2015 0546   MONOABS 0.7 08/14/2012 1014   EOSABS 0.0 04/13/2015 0546   EOSABS 0.1 10/12/2014 0819   BASOSABS 0.0 04/13/2015 0546   BASOSABS 0.0 02/17/2015 0810   BASOSABS 0.0 08/14/2012 1014    Studies/Results: No results found.  Medications: I have reviewed the patient's current medications.  Assessment/Plan:  1. Functional deficits secondary to right caudate intercerebral hemorrhage secondary to hypertensive crisis- pt stable for d/c to NH today 2. DVT Prophylaxis/Anticoagulation: Subcutaneous heparin added for DVT prophylaxis 04/08/2015, d/c upon D/C 3. 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 Hypertension. Clonidine 0.2 mg 3 times a day, Cardizem 240 mg daily, changed to 60mg  q6 on 10/1 due to the necessity of crushing medication, lisinopril 20 mg twice a  day, Lopressor 25 mg twice a day. Will maintain BB dose; once intake improves will restart lasix, SYs BP >150 this am but generally in an acceptable range 5. Neuropsych: This patient is capable of making decisions on her own behalf. 6. Skin/Wound Care: Routine skin checks 7. Fluids/Electrolytes/Nutrition: Routine I&O with follow-up chemistries, meal intake 15-50%, Intake ~529ml fluid, IVF at noc, encourage daytime po intake, will try off IVF over weekend, check BMET on Monday 8. Systolic congestive heart failure. Check daily weights and I's and O's. Patient on Lasix 40 mg  daily prior to admission. Resume as needed currently fluid intake very low , minimal pedal edema 9. Atrial fibrillation. Chronic Coumadin discontinued secondary to intracerebral hemorrhage. Cardiac rate controlled. Continue Cardizem and Lopressor 10. Diabetes mellitus peripheral neuropathy. Hemoglobin A1c 5.9. Sliding scale insulin. Check blood sugars before meals and at bedtime. Patient on Glucophage 500 mg twice a day prior to admission.  -initiated glucophage 250mg  bid but CBG dropped, will hold for now, appetite is variable- currently CBGs have occ elevation-  11. Chronic renal insufficiency. Baseline creatinine 1.35. 1.13 most recently--- cont to push po fluids.IVF at noc,  12. Hypothyroidism. TSH level 2.053. Continue Synthroid 13. History of gout. Zyloprim 100 mg daily. Not active currently  14. Hyperlipidemia. Resume statin at discharge.  16. Urinary incontinence: timed voids. ucx proteus S to Keflex, finished treatment    Length of stay, days: 20  Charlett Blake , MD 05/02/2015, 7:38 AM

## 2015-05-02 NOTE — Progress Notes (Signed)
Occupational Therapy Discharge Summary  Patient Details  Name: Nicole [name] MRN: [id number] Date of Birth: 01-Jun-1931  Today's Date: 05/05/2015  Patient has met 9 of 10 long term goals due to improved activity tolerance, improved balance, ability to compensate for deficits and functional use of  LEFT upper extremity.  Patient to discharge at [facility] Assist level.  Patient's care partner unavailable to provide the necessary physical and cognitive assistance at discharge.    Reasons goals not met: Requires supervision with self-feeding to open containers and due to continued confusion.  Recommendation:  Patient will benefit from ongoing skilled OT services in skilled nursing facility setting to continue to advance functional skills in the area of Reduce care partner burden.  Equipment: No equipment provided  Reasons for discharge: treatment goals met  Patient/family agrees with progress made and goals achieved: Yes  OT Discharge Precautions/Restrictions  Precautions Precautions: Fall Precaution Comments: left hemiparesis Restrictions Weight Bearing Restrictions: No  Pain Pain Assessment Pain Assessment: No/denies pain   ADL ADL ADL Comments: see Functional Assessment   Vision/Perception  Vision- History Baseline Vision/History: Wears glasses Wears Glasses: Reading only Patient Visual Report: No change from baseline Vision- Assessment Vision Assessment?: No apparent visual deficits   Cognition Overall Cognitive Status: Impaired/Different from baseline Arousal/Alertness: Awake/alert Orientation Level: Oriented to person;Disoriented to place;Disoriented to time;Disoriented to situation Attention: Focused;Sustained Focused Attention: Appears intact Sustained Attention: Impaired Sustained Attention Impairment: Verbal basic;Functional basic Memory: Impaired Memory Impairment: Storage deficit;Retrieval deficit;Decreased recall of new information;Decreased long  term memory;Decreased short term memory Decreased Long Term Memory: Verbal basic;Functional basic Decreased Short Term Memory: Verbal basic;Functional basic Awareness: Impaired Awareness Impairment: Intellectual impairment Problem Solving: Impaired Problem Solving Impairment: Verbal basic;Functional basic Behaviors: Confabulation Safety/Judgment: Impaired   Sensation Sensation Light Touch: Appears Intact Stereognosis: Appears Intact Hot/Cold: Appears Intact Proprioception: Appears Intact Coordination Gross Motor Movements are Fluid and Coordinated: Yes Fine Motor Movements are Fluid and Coordinated: Yes Coordination and Movement Description: WFL at [place] for peerformance of BADL; mild hemiparesis at LUE.   Motor  Motor Motor: Motor apraxia;Abnormal postural alignment and control;Hemiplegia Motor - Discharge Observations: L hemiparesis, apraxia and impaired postural control-decreased weight shift to R   Mobility  Bed Mobility Bed Mobility: Rolling Right;Rolling Left;Supine to Sit;Sit to Supine Rolling Right: 5: Supervision Rolling Left: 5: Supervision Supine to Sit: 5: Supervision;HOB flat Sit to Supine: 5: Supervision;HOB flat Transfers Transfers: Sit to Stand;Stand to Sit Sit to Stand: 4: Min guard Stand to Sit: 5: Supervision   Trunk/Postural Assessment  Cervical Assessment Cervical Assessment: Exceptions to [facility] Cervical AROM Overall Cervical AROM: Deficits;Due to premorid status Overall Cervical AROM Comments: all planes of motion limited 75% Thoracic Assessment Thoracic Assessment: Exceptions to [facility] Thoracic AROM Overall Thoracic AROM: Deficits;Due to premorid status Overall Thoracic AROM Comments: all planes of motion limited 75% Lumbar Assessment Lumbar Assessment: Exceptions to [facility] Lumbar AROM Overall Lumbar AROM: Deficits;Due to premorid status Overall Lumbar AROM Comments: all planes of motion limited 75% Postural Control Postural Control: Deficits on  evaluation   Balance Static Sitting Balance Static Sitting - Balance Support: No upper extremity supported Static Sitting - Level of Assistance: 6: Modified independent (Device/Increase time) Dynamic Sitting Balance Dynamic Sitting - Balance Support: Right upper extremity supported;Left upper extremity supported Dynamic Sitting - Level of Assistance: 5: Stand by assistance Static Standing Balance Static Standing - Balance Support: No upper extremity supported Static Standing - Level of Assistance: 5: Stand by assistance Dynamic Standing Balance Dynamic Standing - Balance Support: No  upper extremity supported Dynamic Standing - Level of Assistance: 4: Min assist Dynamic Standing - Balance Activities: Reaching for objects;[place];Reaching across midline;Other (comment)   Extremity/Trunk Assessment RUE Assessment RUE Assessment: Within Functional Limits LUE Assessment LUE Assessment: Exceptions to WFL LUE AROM (degrees) Overall AROM Left Upper Extremity: Deficits LUE Overall AROM Comments: due to weakness LUE Strength LUE Overall Strength: Within Functional Limits for tasks assessed LUE Overall Strength Comments: arm flexion 3/5, elbow flexion 3+/5, elbow extension 3+/5, grip 3+/5   See Function Navigator for Current Functional Status.  [facility] 05/05/2015, 4:43 AM

## 2015-05-02 NOTE — Progress Notes (Signed)
Social Work  Discharge Note  The overall goal for the admission was met for:   Discharge location: No-[facility]-SNF  Length of Stay: Yes-20 DAYS  Discharge activity level: Yes-MIN LEVEL  Home/community participation: Yes  Services provided included: MD, RD, PT, OT, SLP, RN, CM, TR, Pharmacy and SW  Financial Services: Private Insurance: [facility]  Follow-up services arranged: Other: NHP  Comments (or additional information):PT NEEDS TO BE AT A HIGHER LEVEL TO RETURN HOME. HOPEFULLY AFTER SHORT TERM NHP  Patient/Family verbalized understanding of follow-up arrangements: Yes  Individual responsible for coordination of the follow-up plan: [place]  Confirmed correct DME delivered: Elease Hashimoto 05/02/2015    Elease Hashimoto

## 2015-05-02 NOTE — Progress Notes (Signed)
Occupational Therapy Session Note  Patient Details  Name: Nicole [name] MRN: [id number] Date of Birth: February 09, 1931  Today's Date: 05/02/2015 OT Individual Time: 0900-1000 OT Individual Time Calculation (min): 60 min   Short Term Goals: Week 3:  OT Short Term Goal 1 (Week 3): STG=LTG d/t short remaining LOS (transfer to SNF)  Skilled Therapeutic Interventions/Progress Updates: ADL-retraining with focus on improved attention, dynamic standing balance, transfers, functional mobility.   Pt received seated in w/c with quick-release belt attached and pt visiting with her two sisters.   After max-moderate redirection to orient to place and situation, pt agreed to planned bathing and dressing session to prepare for discharge.   Pt performs all tasks with overall min assist however requires significant redirection with varying technique:  subtle cues when needed to inhibit confrontation and direct cues to progress through each task.   Pt remains disoriented to day/time throughout session but can be persuaded to progress to complete each task provided sufficient praise and encouragement is offered.  Pt now using left hand as dominant hand >50% of the time without complaint of shoulder pain.   Pt ambulates best in her room with hand-held guidance d/t confusion and impaired problem-solving while managing device and transfers using similar method to initiate transfers expediently.  Pt left in w/c at end of session with all needs within reach while awaiting return of her sisters.     Therapy Documentation Precautions:  Precautions Precautions: Fall Precaution Comments: L hemiplegia Restrictions Weight Bearing Restrictions: No  Pain: Pain Assessment Pain Assessment: No/denies pain Pain Score: 0-No pain  See Function Navigator for Current Functional Status.   Therapy/Group: Individual Therapy  [place] 05/02/2015, 12:52 PM

## 2015-05-04 ENCOUNTER — Non-Acute Institutional Stay (SKILLED_NURSING_FACILITY): Payer: Medicare Other | Admitting: Nurse Practitioner

## 2015-05-04 DIAGNOSIS — I482 Chronic atrial fibrillation, unspecified: Secondary | ICD-10-CM

## 2015-05-04 DIAGNOSIS — I5032 Chronic diastolic (congestive) heart failure: Secondary | ICD-10-CM

## 2015-05-04 DIAGNOSIS — I610 Nontraumatic intracerebral hemorrhage in hemisphere, subcortical: Secondary | ICD-10-CM | POA: Diagnosis not present

## 2015-05-04 DIAGNOSIS — E1142 Type 2 diabetes mellitus with diabetic polyneuropathy: Secondary | ICD-10-CM | POA: Diagnosis not present

## 2015-05-04 DIAGNOSIS — E039 Hypothyroidism, unspecified: Secondary | ICD-10-CM | POA: Diagnosis not present

## 2015-05-04 DIAGNOSIS — R1310 Dysphagia, unspecified: Secondary | ICD-10-CM

## 2015-05-04 NOTE — Progress Notes (Signed)
Patient ID: Nicole [name], female   DOB: 1931-03-22, 81 y.o.   MRN: [id number]       Full Code Nursing Home Location:  [place] of Service: SNF (31)  PCP: [name], [name], DO  Allergies  Allergen Reactions  . Iohexol      Code: VOM, Desc: PT REPORTS VOMITING W/ IVP DYE- ARS 12/26/08, Onset Date: [id number]   . Z-Pak [Azithromycin]   . Iodinated Diagnostic Agents Nausea Only    PT HAS HAD SINGULAR INCIDENT OF NAUSEA  W/ IV CONTYRAST INJECTION, NONE IF INJECTED SLOWLY//A.[facility]    Chief Complaint  Patient presents with  . Hospitalization Follow-up    Hospital Follow up    HPI:  Patient is a 81 y.o. female seen today at [facility] for hospital follow up. Pt with a history of hypertension, systolic congestive heart failure, diabetes mellitus, chronic renal insufficiency and atrial fibrillation on chronic Coumadin. Pt went to the hospital due to AMS,  headache, blood pressure 166/103, INR 3.6. CT of the head showed large acute hemorrhage centered in the region of the right caudate nucleus with rupture into the lateral ventricle, parenchymal hemorrhage measuring 3.2 x 4.6 x 3.0 cm. Neurosurgery, Dr. Saintclair Halsted, consulted, advised conservative care and coumadin was reversed. Pt was transferred to inpatient rehab. Pt currently at [facility] for ongoing rehab. Pt has no complaints at this time. Working on Editor, commissioning. Some memory loss noted on exam. Staff without concerns at this time.   Review of Systems:  Review of Systems  Constitutional: Negative for fever, chills, activity change and appetite change.  HENT: Negative for hearing loss.   Respiratory: Negative for shortness of breath.   Cardiovascular: Negative for chest pain, palpitations and leg swelling.  Gastrointestinal: Negative for abdominal pain, constipation and blood in stool.  Genitourinary: Negative for dysuria, urgency and frequency.  Musculoskeletal: Positive for gait  problem. Negative for arthralgias.  Skin: Negative for rash.  Neurological: Positive for weakness. Negative for dizziness, light-headedness, numbness and headaches.    Past Medical History  Diagnosis Date  . Colon cancer 07/1997  . Hypertension   . Diabetes mellitus   . CHF (congestive heart failure)   . AAA (abdominal aortic aneurysm)   . Atrial fibrillation   . Hypercholesteremia   . Kidney stone     renal calculi  . Small bowel obstruction due to adhesions 05/16/2012  . Hypothyroidism   . Arthritis   . Anemia   . Cholelithiasis   . Perforation of bile duct   . Hemorrhage of rectum and anus   . Swelling, mass, or lump in head and neck   . Cervicalgia   . Hypoglycemia, unspecified   . Pain in joint, lower leg   . Anxiety   . Shortness of breath   . Acute upper respiratory infections of unspecified site   . Depression   . Thoracic aneurysm without mention of rupture   . GERD (gastroesophageal reflux disease)   . Proteinuria   . Other specified cardiac dysrhythmias(427.89)   . Congestive heart failure, unspecified   . Electrolyte and fluid disorders not elsewhere classified   . Other chronic allergic conjunctivitis   . Hypopotassemia   . Chronic systolic heart failure   . Dizziness and giddiness   . Malignant neoplasm of colon, unspecified site   . Other and unspecified hyperlipidemia   . Gout, unspecified   . Atrial fibrillation   . Aortic aneurysm of  unspecified site without mention of rupture   . Pain in joint, site unspecified   . Palpitations   . CKD (chronic kidney disease)   . Diabetes mellitus    Past Surgical History  Procedure Laterality Date  . Hemicolectomy  08/11/1997  . Kidney stone surgery    . Colon surgery      to remove colon ca  . Cholecystectomy  05/14/2012    Procedure: LAPAROSCOPIC CHOLECYSTECTOMY WITH INTRAOPERATIVE CHOLANGIOGRAM;  Surgeon: Haywood Lasso, MD;  Location: [place];  Service: General;  Laterality: N/A;  . Ercp   05/17/2012    Procedure: ENDOSCOPIC RETROGRADE CHOLANGIOPANCREATOGRAPHY (ERCP);  Surgeon: Missy Sabins, MD;  Location: [place];  Service: Gastroenterology;  Laterality: N/A;  . Ercp  08/12/2012    Procedure: ENDOSCOPIC RETROGRADE CHOLANGIOPANCREATOGRAPHY (ERCP);  Surgeon: Missy Sabins, MD;  Location: [facility] ENDOSCOPY;  Service: Endoscopy;  Laterality: N/A;  . Transthoracic echocardiogram  11/2009    EF 45-50%, mild-mod AV regurg; mild MR; mod TR; ascending aorta mildly dilated  . Cardiac catheterization  2008    moderate severe pulm HTN; with elevted pulm capillary wedge pressure    Social History:   reports that she has never smoked. She has never used smokeless tobacco. She reports that she does not drink alcohol or use illicit drugs.  Family History  Problem Relation Age of Onset  . Heart disease Mother   . Stroke Father   . Hypertension Daughter   . Hypertension Son   . Diabetes Sister   . Hypertension Son     Medications: Patient's Medications  New Prescriptions   No medications on file  Previous Medications   ALLOPURINOL (ZYLOPRIM) 100 MG TABLET    Take 1 tablet (100 mg total) by mouth daily.   AMBULATORY NON FORMULARY MEDICATION    Protonix 40mg  Powder Sig: Take by mouth once daily for GERD   CLONIDINE (CATAPRES) 0.2 MG TABLET    Take one tablet by mouth three times daily for hypertension   DILTIAZEM (CARDIZEM) 60 MG TABLET    Take one tablet by mouth four times daily for hypertension   LEVOTHYROXINE (SYNTHROID, LEVOTHROID) 50 MCG TABLET    Take one tablet by mouth 30 minutes before breakfast once daily for thyroid   LISINOPRIL (PRINIVIL,ZESTRIL) 20 MG TABLET    Take one tablet by mouth twice daily for hypertension   METOPROLOL (LOPRESSOR) 25 MG TABLET    Take 1 tablet (25 mg total) by mouth 2 (two) times daily.   POTASSIUM GLUCONATE 2.5 MEQ TABS    Take four capsules by mouth twice daily for potassium supplement  Modified Medications   No medications on file  Discontinued  Medications   BACITRACIN OINTMENT    Apply 1 application topically 2 (two) times daily.   DILTIAZEM (CARDIZEM CD) 240 MG 24 HR CAPSULE    take 1 capsule by mouth once daily   FUROSEMIDE (LASIX) 40 MG TABLET    take 1 tablet by mouth twice a day   GLIPIZIDE (GLUCOTROL) 10 MG TABLET    Take one tablet by mouth once daily to control blood sugar   MAGNESIUM OXIDE (MAG-OX) 400 (241.3 MG) MG TABLET    Take 400 mg by mouth daily.   METFORMIN (GLUCOPHAGE) 500 MG TABLET    Take 1 tablet (500 mg total) by mouth 2 (two) times daily with a meal.   MULTIPLE VITAMINS-MINERALS (WOMENS BONE HEALTH PO)    Take 1 tablet by mouth daily.   POLYETHYLENE GLYCOL (  MIRALAX / GLYCOLAX) PACKET    Take 17 g by mouth daily as needed for mild constipation.    POTASSIUM CHLORIDE SA (K-DUR,KLOR-CON) 20 MEQ TABLET    Take one tablet by mouth once daily for potassium   SIMVASTATIN (ZOCOR) 40 MG TABLET    Take one tablet by mouth once daily for cholesterol   WARFARIN (COUMADIN) 5 MG TABLET    take 1 tablet by mouth once daily or as directed     Physical Exam: Filed Vitals:   05/04/15 1350  BP: 136/74  Pulse: 72  Temp: 98.1 F (36.7 C)  TempSrc: Oral  Resp: 18  Height: 5\' 9"  (1.753 m)  Weight: 168 lb (76.204 kg)    Physical Exam  Constitutional: She is oriented to person, place, and time. She appears well-developed and well-nourished. No distress.  HENT:  Mouth/Throat: Oropharynx is clear and moist. No oropharyngeal exudate.  Eyes: Conjunctivae and EOM are normal.  Neck: Normal range of motion. Neck supple.  Cardiovascular: Intact distal pulses.  An irregularly irregular rhythm present.  Pulmonary/Chest: Effort normal and breath sounds normal. No respiratory distress.  Abdominal: Soft. Bowel sounds are normal. No hernia.  Musculoskeletal: Normal range of motion. She exhibits no edema.  Neurological: She is alert and oriented to person, place, and time.  Skin: Skin is warm and dry.  Psychiatric: She has a normal  mood and affect.    Labs reviewed: Basic Metabolic Panel:  Recent Labs  04/23/15 0720 04/27/15 0936 05/02/15 0624  NA 134* 135 135  K 4.2 4.3 4.3  CL 103 101 101  CO2 24 26 25   GLUCOSE 161* 240* 150*  BUN 15 8 9   CREATININE 1.14* 1.13* 1.23*  CALCIUM 8.9 9.1 8.9   Liver Function Tests:  Recent Labs  10/04/14 0922 04/04/15 0945 04/13/15 0546  AST 23 22 13*  ALT 13 10* 8*  ALKPHOS 107 91 71  BILITOT 0.9 1.0 0.8  PROT 7.7 7.8 6.4*  ALBUMIN 3.7 3.6 2.5*   No results for input(s): LIPASE, AMYLASE in the last 8760 hours. No results for input(s): AMMONIA in the last 8760 hours. CBC:  Recent Labs  02/17/15 0810 04/04/15 0945  04/10/15 0237 04/12/15 2115 04/13/15 0546  WBC 6.1 7.1  < > 15.2* 10.8* 8.7  NEUTROABS 4.2 6.1  --   --   --  6.7  HGB  --  12.8  < > 13.2 12.2 11.7*  HCT 39.5 37.4  < > 37.5 34.9* 34.2*  MCV  --  77.1*  < > 77.5* 76.9* 76.5*  PLT  --  329  < > 282 259 297  < > = values in this interval not displayed. TSH:  Recent Labs  06/07/14 0916 02/17/15 0810 04/05/15 0819  TSH 3.500 2.200 2.053   A1C: Lab Results  Component Value Date   HGBA1C 5.9* 04/05/2015   Lipid Panel:  Recent Labs  06/07/14 0916 02/17/15 0810 04/05/15 0819  CHOL 169 176 161  HDL 62 70 43  LDLCALC 88 92 97  TRIG 95 69 104  CHOLHDL 2.7 2.5 3.7    Radiological Exams: Ct Head Wo Contrast  04/08/2015  CLINICAL DATA:  Neuro changes and lethargy. Elevated blood pressure. EXAM: CT HEAD WITHOUT CONTRAST TECHNIQUE: Contiguous axial images were obtained from the base of the skull through the vertex without intravenous contrast. COMPARISON:  Yesterday FINDINGS: Skull and Sinuses:No acute finding. Chronic sphenoid sinusitis. Orbits: No acute finding. Gaze to the right. Bilateral cataract resection. Brain:  Stable hematoma in the right basal ganglia, centered at the caudate head. Low-attenuation around the hematoma is stable, as is local midline shift to the left. Stable  volume of intraventricular hemorrhage, layering in the occipital horns of the lateral ventricles. No progressive ventriculomegaly. Stable small volume subarachnoid hemorrhage, likely re- distributed blood based on evolution on previous CT. No new hemorrhage seen. No evidence of acute infarct. No herniation. Background of chronic small vessel disease which is extensive. IMPRESSION: 1. Unchanged right basal ganglia hematoma with intraventricular extension and small subarachnoid blood. 2. Stable mild ventriculomegaly. Electronically Signed   By: Monte Fantasia M.D.   On: 04/08/2015 11:33   Ct Head Wo Contrast  04/07/2015  CLINICAL DATA:  Follow-up intracranial hemorrhage. History of colon cancer, diabetes, atrial fibrillation. EXAM: CT HEAD WITHOUT CONTRAST TECHNIQUE: Contiguous axial images were obtained from the base of the skull through the vertex without intravenous contrast. COMPARISON:  CT head April 05, 2015 FINDINGS: Similar appearance of 3.8 x 3 cm RIGHT basal ganglia intraparenchymal hemorrhage with intraventricular extension, dependent blood products in the occipital horns. Mass effect on the third ventricle with mild RIGHT ventricular entrapment. Small amount of probable redistributed subarachnoid blood within the RIGHT temporal occipital sulci, sylvian fissures. No acute large vascular territory infarcts. Vasogenic edema RIGHT frontal lobe, in a background of at least moderate white matter changes compatible with chronic small vessel ischemic disease. Moderate calcific atherosclerosis of the carotid siphons. Basal cisterns are patent. Status post bilateral ocular lens implants. Lobulated mucosal thickening sphenoid sinuses. No skull fracture. Severe atlantodental osteoarthrosis partially characterized. IMPRESSION: Evolving RIGHT basal ganglia hematoma with intraventricular extension. Mild RIGHT ventricular entrapment. Increasing small to moderate amount of subarachnoid blood products are likely re-  distributed. Electronically Signed   By: Elon Alas M.D.   On: 04/07/2015 05:32   Ct Head Wo Contrast  04/05/2015  CLINICAL DATA:  Intracerebral hemorrhage. EXAM: CT HEAD WITHOUT CONTRAST TECHNIQUE: Contiguous axial images were obtained from the base of the skull through the vertex without intravenous contrast. COMPARISON:  Yesterday FINDINGS: Skull and Sinuses:No posttraumatic findings or destructive process. Sphenoid sinusitis with chronic type mucosal thickening. Orbits: Cataract resections.  No acute finding. Brain: No increase in a parenchymal hematoma centered in the right caudate head, 4 cm in maximal dimension. Better seen or slightly increased intraventricular blood layering in the occipital horns, right greater than left. Stable to decreased third ventricular hematoma. Lateral ventriculomegaly with greater dilatation on the right, including the temporal horn, compatible with least partial obstruction. Interval clearing of blood within the lower fourth ventricle, accounting for subtle new subarachnoid hemorrhage seen in the left and possibly right sylvian fissure (image 14). No visible infarct. Chronic small vessel disease with patchy ischemic gliosis throughout the bilateral cerebral white matter. IMPRESSION: Size stable ~ 4 cm right basal ganglia hematoma. Intraventricular hemorrhage has redistributed, accounting for newly seen trace subarachnoid blood. Obstructive right lateral ventriculomegaly which is stable. Electronically Signed   By: Monte Fantasia M.D.   On: 04/05/2015 09:02   Dg Chest Port 1 View  04/09/2015  CLINICAL DATA:  Altered mental status. Basal ganglia hemorrhagic infarction. EXAM: PORTABLE CHEST - 1 VIEW COMPARISON:  04/04/2015 FINDINGS: The heart is enlarged but stable. The mediastinal and hilar contours are prominent but unchanged. The lungs are clear of acute process. Mild central vascular congestion but no edema, infiltrates or effusions. The bony thorax is grossly  intact. IMPRESSION: Stable cardiac enlargement and prominent mediastinal and hilar contours. Mild central vascular congestion but  no edema, infiltrates or effusions. Electronically Signed   By: Marijo Sanes M.D.   On: 04/09/2015 16:32   Dg Shoulder Left  04/19/2015  CLINICAL DATA:  Left shoulder pain after possible fall. Initial encounter. EXAM: LEFT SHOULDER - 2+ VIEW COMPARISON:  None. FINDINGS: There is no evidence of fracture or dislocation. Mild narrowing and osteophyte formation of the glenohumeral joint is noted. Moderate narrowing of the acromioclavicular joint is noted. Soft tissues are unremarkable. IMPRESSION: Degenerative joint disease of the left acromioclavicular and glenohumeral joints. No acute abnormality seen in the left shoulder. Electronically Signed   By: Marijo Conception, M.D.   On: 04/19/2015 08:01   Dg Abd Portable 1v  04/10/2015  CLINICAL DATA:  Confirm feeding tube placement EXAM: PORTABLE ABDOMEN - 1 VIEW COMPARISON:  04/04/2014 FINDINGS: Dobbhoff feeding tube projects over the left upper quadrant. IMPRESSION: Dobbhoff feeding tube over the anticipated position of the stomach. Electronically Signed   By: Skipper Cliche M.D.   On: 04/10/2015 15:27   Dg Swallowing Func-speech Pathology  04/11/2015  Objective Swallowing Evaluation:   MBS Patient Details Name: ADABELLE GRIFFITHS MRN: [id number] Date of Birth: Nov 28, 1930 Today's Date: 04/11/2015 Time: SLP Start Time (ACUTE ONLY): 1033-SLP Stop Time (ACUTE ONLY): 1048 SLP Time Calculation (min) (ACUTE ONLY): 15 min Past Medical History: Past Medical History Diagnosis Date . Colon cancer 07/1997 . Hypertension  . Diabetes mellitus  . CHF (congestive heart failure)  . AAA (abdominal aortic aneurysm)  . Atrial fibrillation  . Hypercholesteremia  . Kidney stone    renal calculi . Small bowel obstruction due to adhesions 05/16/2012 . Hypothyroidism  . Arthritis  . Anemia  . Cholelithiasis  . Perforation of bile duct  . Hemorrhage of rectum and  anus  . Swelling, mass, or lump in head and neck  . Cervicalgia  . Hypoglycemia, unspecified  . Pain in joint, lower leg  . Anxiety  . Shortness of breath  . Acute upper respiratory infections of unspecified site  . Depression  . Thoracic aneurysm without mention of rupture  . GERD (gastroesophageal reflux disease)  . Proteinuria  . Other specified cardiac dysrhythmias(427.89)  . Congestive heart failure, unspecified  . Electrolyte and fluid disorders not elsewhere classified  . Other chronic allergic conjunctivitis  . Hypopotassemia  . Chronic systolic heart failure  . Dizziness and giddiness  . Malignant neoplasm of colon, unspecified site  . Other and unspecified hyperlipidemia  . Gout, unspecified  . Atrial fibrillation  . Aortic aneurysm of unspecified site without mention of rupture  . Pain in joint, site unspecified  . Palpitations  . CKD (chronic kidney disease)  . Diabetes mellitus  Past Surgical History: Past Surgical History Procedure Laterality Date . Hemicolectomy  08/11/1997 . Kidney stone surgery   . Colon surgery     to remove colon ca . Cholecystectomy  05/14/2012   Procedure: LAPAROSCOPIC CHOLECYSTECTOMY WITH INTRAOPERATIVE CHOLANGIOGRAM;  Surgeon: Haywood Lasso, MD;  Location: [place];  Service: General;  Laterality: N/A; . Ercp  05/17/2012   Procedure: ENDOSCOPIC RETROGRADE CHOLANGIOPANCREATOGRAPHY (ERCP);  Surgeon: Missy Sabins, MD;  Location: [place];  Service: Gastroenterology;  Laterality: N/A; . Ercp  08/12/2012   Procedure: ENDOSCOPIC RETROGRADE CHOLANGIOPANCREATOGRAPHY (ERCP);  Surgeon: Missy Sabins, MD;  Location[facility] ENDOSCOPY;  Service: Endoscopy;  Laterality: N/A; . Transthoracic echocardiogram  11/2009   EF 45-50%, mild-mod AV regurg; mild MR; mod TR; ascending aorta mildly dilated . Cardiac catheterization  2008   moderate severe pulm  HTN; with elevted pulm capillary wedge pressure  HPI: Other Pertinent Information: Nicole [name] is an 81 y.o. female history of atrial fibrillation  on Coumadin, hypertension, hyperlipidemia, diabetes mellitus, CHF and chronic kidney disease, brought to the emergency room after being found on the floor home. CT scan of her head showed fairly large intracerebral hemorrhage involving the right caudate and extending into right ventricle with mass effect with mild midline shift as well as right ventricular enlargement. evaluated by Dr. Saintclair Halsted of the Neurosurgery Service. Ventricular drain is not felt to be warranted at this time. No Data Recorded Assessment / Plan / Recommendation CHL IP CLINICAL IMPRESSIONS 04/11/2015 Therapy Diagnosis Moderate oral phase dysphagia;Mild pharyngeal phase dysphagia Clinical Impression Patient presents with a primarily oral phase dysphagia with secondary pharyngeal component. Oral phase marked by delayed oral transit with tongue pumping, requiring moderate verbal cueing for initiation of swallow, all of which likely due to cognitive deficits (decreased awareness and attention). Self feeding increases A-P oral transit time. The combination of above results in intermittently delayed swallow initiation and trace penetration of thin liquids via straw which consistently clear either during the swallow or after with spontaneous throat clear/cough. Mild vallecular residuals remain post swallow due to base of tongue weakness do increase aspiration risk as patient unable to complete cued dry swallow in attempts to clear. Recommend initiation of dysphagia 3 solids, thin liquids with full supervision to assist with cues for rapid oral transit as needed. Allow to self feed.    CHL IP TREATMENT RECOMMENDATION 04/11/2015 Treatment Recommendations Therapy as outlined in treatment plan below   CHL IP DIET RECOMMENDATION 04/11/2015 SLP Diet Recommendations Dysphagia 3 (Mech soft);Thin Liquid Administration via (None) Medication Administration Crushed with puree Compensations Slow rate;Small sips/bites Postural Changes and/or Swallow Maneuvers (None)   CHL  IP OTHER RECOMMENDATIONS 04/11/2015 Recommended Consults (None) Oral Care Recommendations Oral care BID Other Recommendations (None)   CHL IP FOLLOW UP RECOMMENDATIONS 04/10/2015 Follow up Recommendations (No Data)   CHL IP FREQUENCY AND DURATION 04/11/2015 Speech Therapy Frequency (ACUTE ONLY) min 3x week Treatment Duration 2 weeks       CHL IP REASON FOR REFERRAL 04/11/2015 Reason for Referral Objectively evaluate swallowing function       Gabriel Rainwater MA, CCC-SLP [phone]   [place] Leah Meryl 04/11/2015, 11:35 AM    Assessment/Plan 1. Nontraumatic subcortical hemorrhage of right cerebral hemisphere [facility]) Conservative treatment per neurosurgery t in hospital, completed inpatient rehab and now at [facility] place for ongoing rehab services.    -Cont blood pressure, diabetic and lipid control at this time  2. Type 2 diabetes mellitus with peripheral neuropathy (HCC) Metformin was stopped due to hypoglycemia, pt A1c at goal Will monitor cbg Monday, wedn, Friday   3. Chronic diastolic congestive heart failure (HCC) -euvolemic, on lasix in the past but not currently on due to poor PO intake. Will monitor and l follow up bmp  4. Chronic atrial fibrillation (HCC) Rate controlled on cardizem and metorpolol -not on anticoagulation due to [place].   5. Hypothyroidism, unspecified hypothyroidism type -conts on synthroid 50 mcg, TSH reviewed and in appropriate range in september 2016  6. Dysphagia Has worked with [facility] on dysphagia 1 with thin liquids   Tequita Marrs K. Harle Battiest  [facility] [phone] 8 am - 5 pm) [phone] (after hours)

## 2015-05-05 ENCOUNTER — Encounter: Payer: Self-pay | Admitting: Internal Medicine

## 2015-05-05 ENCOUNTER — Non-Acute Institutional Stay (SKILLED_NURSING_FACILITY): Payer: Medicare Other | Admitting: Internal Medicine

## 2015-05-05 DIAGNOSIS — K219 Gastro-esophageal reflux disease without esophagitis: Secondary | ICD-10-CM | POA: Diagnosis not present

## 2015-05-05 DIAGNOSIS — D638 Anemia in other chronic diseases classified elsewhere: Secondary | ICD-10-CM | POA: Diagnosis not present

## 2015-05-05 DIAGNOSIS — E46 Unspecified protein-calorie malnutrition: Secondary | ICD-10-CM | POA: Diagnosis not present

## 2015-05-05 DIAGNOSIS — I610 Nontraumatic intracerebral hemorrhage in hemisphere, subcortical: Secondary | ICD-10-CM

## 2015-05-05 DIAGNOSIS — R4189 Other symptoms and signs involving cognitive functions and awareness: Secondary | ICD-10-CM | POA: Diagnosis not present

## 2015-05-05 DIAGNOSIS — E039 Hypothyroidism, unspecified: Secondary | ICD-10-CM | POA: Diagnosis not present

## 2015-05-05 DIAGNOSIS — R1310 Dysphagia, unspecified: Secondary | ICD-10-CM

## 2015-05-05 DIAGNOSIS — N289 Disorder of kidney and ureter, unspecified: Secondary | ICD-10-CM

## 2015-05-05 DIAGNOSIS — E1142 Type 2 diabetes mellitus with diabetic polyneuropathy: Secondary | ICD-10-CM

## 2015-05-05 DIAGNOSIS — I4891 Unspecified atrial fibrillation: Secondary | ICD-10-CM | POA: Diagnosis not present

## 2015-05-05 DIAGNOSIS — R531 Weakness: Secondary | ICD-10-CM

## 2015-05-05 DIAGNOSIS — I10 Essential (primary) hypertension: Secondary | ICD-10-CM | POA: Diagnosis not present

## 2015-05-05 NOTE — Progress Notes (Signed)
Patient ID: Nicole [name], female   DOB: 1930-11-01, 80 y.o.   MRN: [id number]     [facility]  PCP: Hollace Kinnier, DO  Code Status: Full Code   Allergies  Allergen Reactions  . Iohexol      Code: VOM, Desc: PT REPORTS VOMITING W/ IVP DYE- ARS 12/26/08, Onset Date: 29476546   . Z-Pak [Azithromycin]   . Iodinated Diagnostic Agents Nausea Only    PT HAS HAD SINGULAR INCIDENT OF NAUSEA  W/ IV CONTYRAST INJECTION, NONE IF INJECTED SLOWLY//A.[facility]    Chief Complaint  Patient presents with  . New Admit To SNF    New Admission      HPI:  81 y.o. patient is here for short term rehabilitation post hospital admission from 04/04/15-04/12/15 with acute intracranial hemorrhage of right basal ganglia with intraventricular extension due to hypertensive crisis and warfarin coagulopathy. Neurology and nuerosurgery were consulted and conservative management was carried out. She was in inpatient rehabilitation from 04/13/15-05/02/15 undergoing PT, OT, SLP therapy. She is seen in her room today. She is alert and oriented to person. She denies any concerns and is reading newspaper.   Review of Systems:  Constitutional: Negative for fever, chills, diaphoresis.  HENT: Negative for headache, congestion, nasal discharge, difficulty swallowing.   Eyes: Negative for eye pain, blurred vision, double vision and discharge.  Respiratory: Negative for cough, shortness of breath and wheezing.   Cardiovascular: Negative for chest pain, palpitations, leg swelling.  Gastrointestinal: Negative for heartburn, nausea, vomiting, abdominal pain. Had bowel movement yesterday Genitourinary: Negative for dysuria, flank pain.  Musculoskeletal: Negative for back pain, falls Skin: Negative for itching, rash.  Neurological: Negative for dizziness, tingling, focal weakness Psychiatric/Behavioral: Negative for depression   Past Medical History  Diagnosis Date  . Colon cancer ([place]) 07/1997  .  Hypertension   . Diabetes mellitus   . CHF (congestive heart failure) ([place])   . AAA (abdominal aortic aneurysm) ([place])   . Atrial fibrillation ([place])   . Hypercholesteremia   . Kidney stone     renal calculi  . Small bowel obstruction due to adhesions ([place]) 05/16/2012  . Hypothyroidism   . Arthritis   . Anemia   . Cholelithiasis   . Perforation of bile duct   . Hemorrhage of rectum and anus   . Swelling, mass, or lump in head and neck   . Cervicalgia   . Hypoglycemia, unspecified   . Pain in joint, lower leg   . Anxiety   . Shortness of breath   . Acute upper respiratory infections of unspecified site   . Depression   . Thoracic aneurysm without mention of rupture   . GERD (gastroesophageal reflux disease)   . Proteinuria   . Other specified cardiac dysrhythmias(427.89)   . Congestive heart failure, unspecified   . Electrolyte and fluid disorders not elsewhere classified   . Other chronic allergic conjunctivitis   . Hypopotassemia   . Chronic systolic heart failure ([place])   . Dizziness and giddiness   . Malignant neoplasm of colon, unspecified site   . Other and unspecified hyperlipidemia   . Gout, unspecified   . Atrial fibrillation ([place])   . Aortic aneurysm of unspecified site without mention of rupture   . Pain in joint, site unspecified   . Palpitations   . CKD (chronic kidney disease)   . Diabetes mellitus [facility])    Past Surgical History  Procedure Laterality Date  . Hemicolectomy  08/11/1997  . Kidney  stone surgery    . Colon surgery      to remove colon ca  . Cholecystectomy  05/14/2012    Procedure: LAPAROSCOPIC CHOLECYSTECTOMY WITH INTRAOPERATIVE CHOLANGIOGRAM;  Surgeon: Haywood Lasso, MD;  Location: [place];  Service: General;  Laterality: N/A;  . Ercp  05/17/2012    Procedure: ENDOSCOPIC RETROGRADE CHOLANGIOPANCREATOGRAPHY (ERCP);  Surgeon: Missy Sabins, MD;  Location: [place];  Service: Gastroenterology;  Laterality: N/A;  . Ercp  08/12/2012    Procedure:  ENDOSCOPIC RETROGRADE CHOLANGIOPANCREATOGRAPHY (ERCP);  Surgeon: Missy Sabins, MD;  Location: [facility] ENDOSCOPY;  Service: Endoscopy;  Laterality: N/A;  . Transthoracic echocardiogram  11/2009    EF 45-50%, mild-mod AV regurg; mild MR; mod TR; ascending aorta mildly dilated  . Cardiac catheterization  2008    moderate severe pulm HTN; with elevted pulm capillary wedge pressure    Social History:   reports that she has never smoked. She has never used smokeless tobacco. She reports that she does not drink alcohol or use illicit drugs.  Family History  Problem Relation Age of Onset  . Heart disease Mother   . Stroke Father   . Hypertension Daughter   . Hypertension Son   . Diabetes Sister   . Hypertension Son     Medications:   Medication List       This list is accurate as of: 05/05/15 11:18 AM.  Always use your most recent med list.               allopurinol 100 MG tablet  Commonly known as:  ZYLOPRIM  1 by mouth daily for gout     cloNIDine 0.2 MG tablet  Commonly known as:  CATAPRES  Take one tablet by mouth three times daily for hypertension     diltiazem 60 MG tablet  Commonly known as:  CARDIZEM  Take one tablet by mouth four times daily for hypertension     levothyroxine 50 MCG tablet  Commonly known as:  SYNTHROID, LEVOTHROID  Take one tablet by mouth 30 minutes before breakfast once daily for thyroid     lisinopril 20 MG tablet  Commonly known as:  PRINIVIL,ZESTRIL  Take one tablet by mouth twice daily for hypertension     metoprolol tartrate 25 MG tablet  Commonly known as:  LOPRESSOR  1 by mouth twice daily for hypertension     Potassium Gluconate 2.5 MEQ Tabs  Take four capsules by mouth twice daily for potassium supplement     PROTONIX 40 mg/20 mL Pack  Generic drug:  pantoprazole sodium  Take 20 mg by mouth daily.         Physical Exam: Filed Vitals:   05/05/15 1112  BP: 125/75  Pulse: 59  Temp: 98.4 F (36.9 C)  TempSrc: Oral  Resp: 20    SpO2: 97%    General- elderly female, well built, in no acute distress Head- normocephalic, atraumatic Nose- normal nasal mucosa, no maxillary or frontal sinus tenderness, no nasal discharge Throat- moist mucus membrane  Eyes- no pallor, no icterus, no discharge, normal conjunctiva, normal sclera Neck- no cervical lymphadenopathy Cardiovascular- irregular heart rate,  no murmurs, palpable dorsalis pedis and radial pulses, no leg edema Respiratory- bilateral clear to auscultation, no wheeze, no rhonchi, no crackles, no use of accessory muscles Abdomen- bowel sounds present, soft, non tender Musculoskeletal- able to move all 4 extremities, generalized weakness Neurological- no focal deficit, alert and oriented to person only Skin- warm and dry Psychiatry- normal mood  and affect    Labs reviewed: Basic Metabolic Panel:  Recent Labs  04/23/15 0720 04/27/15 0936 05/02/15 0624  NA 134* 135 135  K 4.2 4.3 4.3  CL 103 101 101  CO2 24 26 25   GLUCOSE 161* 240* 150*  BUN 15 8 9   CREATININE 1.14* 1.13* 1.23*  CALCIUM 8.9 9.1 8.9   Liver Function Tests:  Recent Labs  10/04/14 0922 04/04/15 0945 04/13/15 0546  AST 23 22 13*  ALT 13 10* 8*  ALKPHOS 107 91 71  BILITOT 0.9 1.0 0.8  PROT 7.7 7.8 6.4*  ALBUMIN 3.7 3.6 2.5*   No results for input(s): LIPASE, AMYLASE in the last 8760 hours. No results for input(s): AMMONIA in the last 8760 hours. CBC:  Recent Labs  02/17/15 0810 04/04/15 0945  04/10/15 0237 04/12/15 2115 04/13/15 0546  WBC 6.1 7.1  < > 15.2* 10.8* 8.7  NEUTROABS 4.2 6.1  --   --   --  6.7  HGB  --  12.8  < > 13.2 12.2 11.7*  HCT 39.5 37.4  < > 37.5 34.9* 34.2*  MCV  --  77.1*  < > 77.5* 76.9* 76.5*  PLT  --  329  < > 282 259 297  < > = values in this interval not displayed. Cardiac Enzymes: No results for input(s): CKTOTAL, CKMB, CKMBINDEX, TROPONINI in the last 8760 hours. BNP: Invalid input(s): POCBNP CBG:  Recent Labs  05/01/15 2108  05/02/15 0632 05/02/15 1128  GLUCAP 150* 135* 163*    Radiological Exams: Dg Chest 2 View  04/04/2015    IMPRESSION: Stable mild cardiac enlargement.  No active disease.      Ct Head Wo Contrast  04/08/2015   IMPRESSION: 1. Unchanged right basal ganglia hematoma with intraventricular extension and small subarachnoid blood. 2. Stable mild ventriculomegaly.    04/07/2015   IMPRESSION: Evolving RIGHT basal ganglia hematoma with intraventricular extension. Mild RIGHT ventricular entrapment.  Increasing small to moderate amount of subarachnoid blood products are likely re- distributed.     04/05/2015   IMPRESSION: Size stable ~ 4 cm right basal ganglia hematoma. Intraventricular hemorrhage has redistributed, accounting for newly seen trace subarachnoid blood. Obstructive right lateral ventriculomegaly which is stable.    04/04/2015   IMPRESSION: Large acute hemorrhage centered in the region of the right caudate nucleus with rupture into the lateral ventricle and evidence of developing hydrocephalus and trapping of the right lateral ventricle. There is a mild amount of midline shift to the left. Parenchymal hemorrhage measures roughly 3.2 x 4.6 x 3.0 cm. This is most likely a hypertensive parenchymal bleed.     2D Echocardiogram  - Left ventricle: Systolic function was normal. The estimated   ejection fraction was in the range of 55% to 60%. - Aortic valve: There was mild regurgitation. - Mitral valve: There was mild to moderate regurgitation directed   centrally. - Left atrium: The atrium was moderately dilated. - Right atrium: The atrium was moderately dilated. - Pulmonic valve: There was moderate regurgitation directed   centrally. - Pulmonary arteries: Systolic pressure was mildly increased. PA   peak pressure: 43 mm Hg (S).  EKG  atrial fibrillation, rate 82.   Assessment/Plan  Generalized weakness Post intracerebral bleed and deconditioning. Will have her work with physical therapy  and occupational therapy team to help with gait training and muscle strengthening exercises.fall precautions. Skin care. Encourage to be out of bed. Has follow up with outpatient rehab/ PMR centre  Intracerebral bleed Seen  by neurosurgery team. In setting of hypertensive crisis. Currently bp reading stable. Monitor bp on daily basis. Off coumadin at present. Has f/u with nuerosurgery- to review need for anticoagulation with hx of afib and high CHADS score during this visit.  Dysphagia Continue dysphagia 1 diet and SLP to follow, aspiration precautions  HTN bp reading stable. Monitor bp. Continue clonidine 0.2 mg tid with lopressor 25 mg bid and lisinopril 20 mg twice daily  afib Irregular rate but controlled. Continue diltiazem 60 mg qid with lopressor 25 mg bid. Off coumadin with recent intracerebral bleed. Monitor clinically.   Cognitive impairment With recent intracranial bleed. Monitor clinically for now, assistance with ADLs and fall precautions. Pressure ulcer prophylaxis.  Hypothyroidism Monitor clinically, continue levothyroxine 50 mcg daily Lab Results  Component Value Date   TSH 2.053 04/05/2015   gerd Stable on protonix 40 mg daily  Protein calorie malnutrition Dietary consult, weight twice a week, encourage po intake  Renal impairment Monitor bmp  Anemia of chronic disease Monitor cbc  DM Diet controlled at present, off glucophage with hypoglycemia in hospital. Monitor cbg once a day Lab Results  Component Value Date   HGBA1C 5.9* 04/05/2015    Goals of care: short term rehabilitation   Labs/tests ordered: cbc, bmp  Family/ staff Communication: reviewed care plan with patient and nursing supervisor    Blanchie Serve, MD  [place] [phone] (Monday-Friday 8 am - 5 pm) [phone] (afterhours)

## 2015-05-16 ENCOUNTER — Non-Acute Institutional Stay (SKILLED_NURSING_FACILITY): Payer: Medicare Other | Admitting: Nurse Practitioner

## 2015-05-16 ENCOUNTER — Encounter: Payer: Self-pay | Admitting: Nurse Practitioner

## 2015-05-16 DIAGNOSIS — I610 Nontraumatic intracerebral hemorrhage in hemisphere, subcortical: Secondary | ICD-10-CM | POA: Diagnosis not present

## 2015-05-16 DIAGNOSIS — E039 Hypothyroidism, unspecified: Secondary | ICD-10-CM | POA: Diagnosis not present

## 2015-05-16 DIAGNOSIS — I482 Chronic atrial fibrillation, unspecified: Secondary | ICD-10-CM

## 2015-05-16 DIAGNOSIS — R4189 Other symptoms and signs involving cognitive functions and awareness: Secondary | ICD-10-CM

## 2015-05-16 DIAGNOSIS — I10 Essential (primary) hypertension: Secondary | ICD-10-CM

## 2015-05-16 DIAGNOSIS — K219 Gastro-esophageal reflux disease without esophagitis: Secondary | ICD-10-CM | POA: Diagnosis not present

## 2015-05-16 NOTE — Progress Notes (Signed)
Nursing Home Location:  [facility]   Place of Service: SNF (31)  PCP: [name], [name], DO  Allergies  Allergen Reactions  . Iohexol      Code: VOM, Desc: PT REPORTS VOMITING W/ IVP DYE- ARS 12/26/08, Onset Date: 40102725   . Z-Pak [Azithromycin]   . Iodinated Diagnostic Agents Nausea Only    PT HAS HAD SINGULAR INCIDENT OF NAUSEA  W/ IV CONTYRAST INJECTION, NONE IF INJECTED SLOWLY//A.[facility]    Chief Complaint  Patient presents with  . Discharge Note    HPI:  Patient is a 81 y.o. female seen today at [facility] for discharge home with 24 hour care. Pt at [facility] for rehabilitation after hospital from 04/04/15-04/12/15 with acute intracranial hemorrhage of right basal ganglia with intraventricular extension due to hypertensive crisis and warfarin coagulopathy. Neurology and nuerosurgery were consulted and conservative management was carried out.  Pt was in inpatient rehabilitation from 04/13/15-05/02/15 undergoing PT, OT, SLP therapy. Pt has been doing well with therapy to regain strength however memory has been significantly affected by CVA. Patient currently doing well with therapy, now stable to discharge home with 24 hour care and home health.  Review of Systems:  Review of Systems  Constitutional: Negative for activity change, appetite change, fatigue and unexpected weight change.  HENT: Negative for congestion and hearing loss.   Eyes: Negative.   Respiratory: Negative for cough and shortness of breath.   Cardiovascular: Negative for chest pain, palpitations and leg swelling.  Gastrointestinal: Negative for abdominal pain, diarrhea and constipation.  Genitourinary: Negative for dysuria and difficulty urinating.  Musculoskeletal: Negative for myalgias and arthralgias.  Skin: Negative for color change and wound.  Neurological: Negative for dizziness and weakness.  Psychiatric/Behavioral: Positive for confusion. Negative for behavioral  problems and agitation.    Past Medical History  Diagnosis Date  . Colon cancer ([place]) 07/1997  . Hypertension   . Diabetes mellitus   . CHF (congestive heart failure) ([place])   . AAA (abdominal aortic aneurysm) ([place])   . Atrial fibrillation ([place])   . Hypercholesteremia   . Kidney stone     renal calculi  . Small bowel obstruction due to adhesions ([place]) 05/16/2012  . Hypothyroidism   . Arthritis   . Anemia   . Cholelithiasis   . Perforation of bile duct   . Hemorrhage of rectum and anus   . Swelling, mass, or lump in head and neck   . Cervicalgia   . Hypoglycemia, unspecified   . Pain in joint, lower leg   . Anxiety   . Shortness of breath   . Acute upper respiratory infections of unspecified site   . Depression   . Thoracic aneurysm without mention of rupture   . GERD (gastroesophageal reflux disease)   . Proteinuria   . Other specified cardiac dysrhythmias(427.89)   . Congestive heart failure, unspecified   . Electrolyte and fluid disorders not elsewhere classified   . Other chronic allergic conjunctivitis   . Hypopotassemia   . Chronic systolic heart failure ([place])   . Dizziness and giddiness   . Malignant neoplasm of colon, unspecified site   . Other and unspecified hyperlipidemia   . Gout, unspecified   . Atrial fibrillation ([place])   . Aortic aneurysm of unspecified site without mention of rupture   . Pain in joint, site unspecified   . Palpitations   . CKD (chronic kidney disease)   . Diabetes mellitus ([place])  Past Surgical History  Procedure Laterality Date  . Hemicolectomy  08/11/1997  . Kidney stone surgery    . Colon surgery      to remove colon ca  . Cholecystectomy  05/14/2012    Procedure: LAPAROSCOPIC CHOLECYSTECTOMY WITH INTRAOPERATIVE CHOLANGIOGRAM;  Surgeon: Haywood Lasso, MD;  Location: [place];  Service: General;  Laterality: N/A;  . Ercp  05/17/2012    Procedure: ENDOSCOPIC RETROGRADE CHOLANGIOPANCREATOGRAPHY (ERCP);  Surgeon: Missy Sabins,  MD;  Location: [place];  Service: Gastroenterology;  Laterality: N/A;  . Ercp  08/12/2012    Procedure: ENDOSCOPIC RETROGRADE CHOLANGIOPANCREATOGRAPHY (ERCP);  Surgeon: Missy Sabins, MD;  Location: [facility];  Service: Endoscopy;  Laterality: N/A;  . Transthoracic echocardiogram  11/2009    EF 45-50%, mild-mod AV regurg; mild MR; mod TR; ascending aorta mildly dilated  . Cardiac catheterization  2008    moderate severe pulm HTN; with elevted pulm capillary wedge pressure    Social History:   reports that she has never smoked. She has never used smokeless tobacco. She reports that she does not drink alcohol or use illicit drugs.  Family History  Problem Relation Age of Onset  . Heart disease Mother   . Stroke Father   . Hypertension Daughter   . Hypertension Son   . Diabetes Sister   . Hypertension Son     Medications: Patient's Medications  New Prescriptions   No medications on file  Previous Medications   ALLOPURINOL (ZYLOPRIM) 100 MG TABLET    1 by mouth daily for gout   CLONIDINE (CATAPRES) 0.2 MG TABLET    Take one tablet by mouth three times daily for hypertension   DILTIAZEM (CARDIZEM) 60 MG TABLET    Take one tablet by mouth four times daily for hypertension   LEVOTHYROXINE (SYNTHROID, LEVOTHROID) 50 MCG TABLET    Take one tablet by mouth 30 minutes before breakfast once daily for thyroid   LISINOPRIL (PRINIVIL,ZESTRIL) 20 MG TABLET    Take one tablet by mouth twice daily for hypertension   METOPROLOL (LOPRESSOR) 50 MG TABLET    Take 50 mg by mouth 2 (two) times daily.   PANTOPRAZOLE SODIUM (PROTONIX) 40 MG/20 ML PACK    Take 40 mg by mouth daily.    POTASSIUM GLUCONATE 2.5 MEQ TABS    Take four capsules by mouth twice daily for potassium supplement  Modified Medications   No medications on file  Discontinued Medications   METOPROLOL TARTRATE (LOPRESSOR) 25 MG TABLET    50 mg. 1 by mouth twice daily for hypertension     Physical Exam: Filed Vitals:   05/16/15 1008    BP: 138/74  Pulse: 60  Temp: 98.5 F (36.9 C)  Resp: 19  Weight: 162 lb 9.6 oz (73.755 kg)    Physical Exam  Constitutional: She appears well-developed and well-nourished. No distress.  HENT:  Mouth/Throat: Oropharynx is clear and moist. No oropharyngeal exudate.  Eyes: Conjunctivae and EOM are normal.  Neck: Normal range of motion. Neck supple.  Cardiovascular: Intact distal pulses.  An irregularly irregular rhythm present.  Pulmonary/Chest: Effort normal and breath sounds normal. No respiratory distress.  Abdominal: Soft. Bowel sounds are normal. No hernia.  Musculoskeletal: Normal range of motion. She exhibits no edema.  Neurological: She is alert.  Skin: Skin is warm and dry.  Psychiatric: She has a normal mood and affect.    Labs reviewed: Basic Metabolic Panel:  Recent Labs  04/23/15 0720 04/27/15 0936 05/02/15 0624  NA  134* 135 135  K 4.2 4.3 4.3  CL 103 101 101  CO2 24 26 25   GLUCOSE 161* 240* 150*  BUN 15 8 9   CREATININE 1.14* 1.13* 1.23*  CALCIUM 8.9 9.1 8.9   Liver Function Tests:  Recent Labs  10/04/14 0922 04/04/15 0945 04/13/15 0546  AST 23 22 13*  ALT 13 10* 8*  ALKPHOS 107 91 71  BILITOT 0.9 1.0 0.8  PROT 7.7 7.8 6.4*  ALBUMIN 3.7 3.6 2.5*   No results for input(s): LIPASE, AMYLASE in the last 8760 hours. No results for input(s): AMMONIA in the last 8760 hours. CBC:  Recent Labs  02/17/15 0810 04/04/15 0945  04/10/15 0237 04/12/15 2115 04/13/15 0546  WBC 6.1 7.1  < > 15.2* 10.8* 8.7  NEUTROABS 4.2 6.1  --   --   --  6.7  HGB  --  12.8  < > 13.2 12.2 11.7*  HCT 39.5 37.4  < > 37.5 34.9* 34.2*  MCV  --  77.1*  < > 77.5* 76.9* 76.5*  PLT  --  329  < > 282 259 297  < > = values in this interval not displayed. TSH:  Recent Labs  06/07/14 0916 02/17/15 0810 04/05/15 0819  TSH 3.500 2.200 2.053   A1C: Lab Results  Component Value Date   HGBA1C 5.9* 04/05/2015   Lipid Panel:  Recent Labs  06/07/14 0916 02/17/15 0810  04/05/15 0819  CHOL 169 176 161  HDL 62 70 43  LDLCALC 88 92 97  TRIG 95 69 104  CHOLHDL 2.7 2.5 3.7     Assessment/Plan 1. Nontraumatic subcortical hemorrhage of right cerebral hemisphere [facility]) intracerebral bleed in the setting of hypertensive crisis, was taken off coumadin.  followed by neurosurgery   2. Essential hypertension Blood pressure stable, Continue clonidine 0.2 mg tid with lopressor 25 mg bid and lisinopril 20 mg twice daily  3. Hypothyroidism, unspecified hypothyroidism type -stable, conts on levothyroxine 50 mcg daily   4. Chronic atrial fibrillation (HCC) -rate controlled on diltiazem 60 mg QID with lopressor 25 mg BID. -Off coumadin at this time due to recent intracerebral bleed.   5. Gastroesophageal reflux disease, esophagitis presence not specified -stable conts on protonix daily   6. Cognitive impairment After recent CVA, needing increased assistance with ADLs and will need 24 hour care for safety on discharge.   pt is stable for discharge-will need PT per home health. No DME needed. Rx written.  will need to follow up with PCP within 2 weeks.     Carlos American. Harle Battiest  [facility] [phone] 8 am - 5 pm) [phone] (after hours)

## 2015-05-19 DIAGNOSIS — I5022 Chronic systolic (congestive) heart failure: Secondary | ICD-10-CM | POA: Diagnosis not present

## 2015-05-19 DIAGNOSIS — R413 Other amnesia: Secondary | ICD-10-CM | POA: Diagnosis not present

## 2015-05-19 DIAGNOSIS — R410 Disorientation, unspecified: Secondary | ICD-10-CM | POA: Diagnosis not present

## 2015-05-19 DIAGNOSIS — N189 Chronic kidney disease, unspecified: Secondary | ICD-10-CM | POA: Diagnosis not present

## 2015-05-19 DIAGNOSIS — I69398 Other sequelae of cerebral infarction: Secondary | ICD-10-CM | POA: Diagnosis not present

## 2015-05-19 DIAGNOSIS — E1122 Type 2 diabetes mellitus with diabetic chronic kidney disease: Secondary | ICD-10-CM | POA: Diagnosis not present

## 2015-05-19 DIAGNOSIS — I130 Hypertensive heart and chronic kidney disease with heart failure and stage 1 through stage 4 chronic kidney disease, or unspecified chronic kidney disease: Secondary | ICD-10-CM | POA: Diagnosis not present

## 2015-05-19 DIAGNOSIS — M6281 Muscle weakness (generalized): Secondary | ICD-10-CM | POA: Diagnosis not present

## 2015-05-19 DIAGNOSIS — E785 Hyperlipidemia, unspecified: Secondary | ICD-10-CM | POA: Diagnosis not present

## 2015-05-19 DIAGNOSIS — R2681 Unsteadiness on feet: Secondary | ICD-10-CM | POA: Diagnosis not present

## 2015-05-19 DIAGNOSIS — F329 Major depressive disorder, single episode, unspecified: Secondary | ICD-10-CM | POA: Diagnosis not present

## 2015-05-19 DIAGNOSIS — Z9181 History of falling: Secondary | ICD-10-CM | POA: Diagnosis not present

## 2015-05-24 ENCOUNTER — Telehealth: Payer: Self-pay | Admitting: *Deleted

## 2015-05-24 DIAGNOSIS — I5022 Chronic systolic (congestive) heart failure: Secondary | ICD-10-CM | POA: Diagnosis not present

## 2015-05-24 DIAGNOSIS — M6281 Muscle weakness (generalized): Secondary | ICD-10-CM | POA: Diagnosis not present

## 2015-05-24 DIAGNOSIS — I130 Hypertensive heart and chronic kidney disease with heart failure and stage 1 through stage 4 chronic kidney disease, or unspecified chronic kidney disease: Secondary | ICD-10-CM | POA: Diagnosis not present

## 2015-05-24 DIAGNOSIS — F329 Major depressive disorder, single episode, unspecified: Secondary | ICD-10-CM | POA: Diagnosis not present

## 2015-05-24 DIAGNOSIS — R2681 Unsteadiness on feet: Secondary | ICD-10-CM | POA: Diagnosis not present

## 2015-05-24 DIAGNOSIS — E1122 Type 2 diabetes mellitus with diabetic chronic kidney disease: Secondary | ICD-10-CM | POA: Diagnosis not present

## 2015-05-24 DIAGNOSIS — N189 Chronic kidney disease, unspecified: Secondary | ICD-10-CM | POA: Diagnosis not present

## 2015-05-24 DIAGNOSIS — Z9181 History of falling: Secondary | ICD-10-CM | POA: Diagnosis not present

## 2015-05-24 DIAGNOSIS — R413 Other amnesia: Secondary | ICD-10-CM | POA: Diagnosis not present

## 2015-05-24 DIAGNOSIS — I69398 Other sequelae of cerebral infarction: Secondary | ICD-10-CM | POA: Diagnosis not present

## 2015-05-24 DIAGNOSIS — R410 Disorientation, unspecified: Secondary | ICD-10-CM | POA: Diagnosis not present

## 2015-05-24 DIAGNOSIS — E785 Hyperlipidemia, unspecified: Secondary | ICD-10-CM | POA: Diagnosis not present

## 2015-05-24 NOTE — Telephone Encounter (Signed)
Roland with [name] called wanting verbal orders for PT 2 times a week for 4 week. Verbal given and he will fax order to be signed.

## 2015-05-26 DIAGNOSIS — E1122 Type 2 diabetes mellitus with diabetic chronic kidney disease: Secondary | ICD-10-CM | POA: Diagnosis not present

## 2015-05-26 DIAGNOSIS — R2681 Unsteadiness on feet: Secondary | ICD-10-CM | POA: Diagnosis not present

## 2015-05-26 DIAGNOSIS — F329 Major depressive disorder, single episode, unspecified: Secondary | ICD-10-CM | POA: Diagnosis not present

## 2015-05-26 DIAGNOSIS — Z9181 History of falling: Secondary | ICD-10-CM | POA: Diagnosis not present

## 2015-05-26 DIAGNOSIS — R413 Other amnesia: Secondary | ICD-10-CM | POA: Diagnosis not present

## 2015-05-26 DIAGNOSIS — I5022 Chronic systolic (congestive) heart failure: Secondary | ICD-10-CM | POA: Diagnosis not present

## 2015-05-26 DIAGNOSIS — N189 Chronic kidney disease, unspecified: Secondary | ICD-10-CM | POA: Diagnosis not present

## 2015-05-26 DIAGNOSIS — R410 Disorientation, unspecified: Secondary | ICD-10-CM | POA: Diagnosis not present

## 2015-05-26 DIAGNOSIS — E785 Hyperlipidemia, unspecified: Secondary | ICD-10-CM | POA: Diagnosis not present

## 2015-05-26 DIAGNOSIS — I130 Hypertensive heart and chronic kidney disease with heart failure and stage 1 through stage 4 chronic kidney disease, or unspecified chronic kidney disease: Secondary | ICD-10-CM | POA: Diagnosis not present

## 2015-05-26 DIAGNOSIS — M6281 Muscle weakness (generalized): Secondary | ICD-10-CM | POA: Diagnosis not present

## 2015-05-26 DIAGNOSIS — I69398 Other sequelae of cerebral infarction: Secondary | ICD-10-CM | POA: Diagnosis not present

## 2015-05-31 DIAGNOSIS — N189 Chronic kidney disease, unspecified: Secondary | ICD-10-CM | POA: Diagnosis not present

## 2015-05-31 DIAGNOSIS — M6281 Muscle weakness (generalized): Secondary | ICD-10-CM | POA: Diagnosis not present

## 2015-05-31 DIAGNOSIS — F329 Major depressive disorder, single episode, unspecified: Secondary | ICD-10-CM | POA: Diagnosis not present

## 2015-05-31 DIAGNOSIS — R410 Disorientation, unspecified: Secondary | ICD-10-CM | POA: Diagnosis not present

## 2015-05-31 DIAGNOSIS — R2681 Unsteadiness on feet: Secondary | ICD-10-CM | POA: Diagnosis not present

## 2015-05-31 DIAGNOSIS — I69398 Other sequelae of cerebral infarction: Secondary | ICD-10-CM | POA: Diagnosis not present

## 2015-05-31 DIAGNOSIS — E785 Hyperlipidemia, unspecified: Secondary | ICD-10-CM | POA: Diagnosis not present

## 2015-05-31 DIAGNOSIS — E1122 Type 2 diabetes mellitus with diabetic chronic kidney disease: Secondary | ICD-10-CM | POA: Diagnosis not present

## 2015-05-31 DIAGNOSIS — I130 Hypertensive heart and chronic kidney disease with heart failure and stage 1 through stage 4 chronic kidney disease, or unspecified chronic kidney disease: Secondary | ICD-10-CM | POA: Diagnosis not present

## 2015-05-31 DIAGNOSIS — I5022 Chronic systolic (congestive) heart failure: Secondary | ICD-10-CM | POA: Diagnosis not present

## 2015-05-31 DIAGNOSIS — Z9181 History of falling: Secondary | ICD-10-CM | POA: Diagnosis not present

## 2015-05-31 DIAGNOSIS — R413 Other amnesia: Secondary | ICD-10-CM | POA: Diagnosis not present

## 2015-06-01 ENCOUNTER — Telehealth: Payer: Self-pay

## 2015-06-01 NOTE — Telephone Encounter (Signed)
Manual fax was received from [facility] for diabetic testing supplies. I called patient to verify who she is receiving supplies from. In August we completed paperwork from a different diabetic supply company([employer]). Patient was unsure of who she is currently using and put her caregiver on the phone. Patient's caregiver took a message for patient's son to return call to further discuss

## 2015-06-01 NOTE — Telephone Encounter (Signed)
Message was received from Catherin Doorn (patient's son) [phone], Nicole [name] was unable to verify which diabetic company patient is using. Nicole [name] states he will call me back when he is at his mother's house to further discuss

## 2015-06-02 DIAGNOSIS — M6281 Muscle weakness (generalized): Secondary | ICD-10-CM | POA: Diagnosis not present

## 2015-06-02 DIAGNOSIS — R2681 Unsteadiness on feet: Secondary | ICD-10-CM | POA: Diagnosis not present

## 2015-06-02 DIAGNOSIS — R410 Disorientation, unspecified: Secondary | ICD-10-CM | POA: Diagnosis not present

## 2015-06-02 DIAGNOSIS — N189 Chronic kidney disease, unspecified: Secondary | ICD-10-CM | POA: Diagnosis not present

## 2015-06-02 DIAGNOSIS — E785 Hyperlipidemia, unspecified: Secondary | ICD-10-CM | POA: Diagnosis not present

## 2015-06-02 DIAGNOSIS — I69398 Other sequelae of cerebral infarction: Secondary | ICD-10-CM | POA: Diagnosis not present

## 2015-06-02 DIAGNOSIS — F329 Major depressive disorder, single episode, unspecified: Secondary | ICD-10-CM | POA: Diagnosis not present

## 2015-06-02 DIAGNOSIS — E1122 Type 2 diabetes mellitus with diabetic chronic kidney disease: Secondary | ICD-10-CM | POA: Diagnosis not present

## 2015-06-02 DIAGNOSIS — I5022 Chronic systolic (congestive) heart failure: Secondary | ICD-10-CM | POA: Diagnosis not present

## 2015-06-02 DIAGNOSIS — I130 Hypertensive heart and chronic kidney disease with heart failure and stage 1 through stage 4 chronic kidney disease, or unspecified chronic kidney disease: Secondary | ICD-10-CM | POA: Diagnosis not present

## 2015-06-02 DIAGNOSIS — Z9181 History of falling: Secondary | ICD-10-CM | POA: Diagnosis not present

## 2015-06-02 DIAGNOSIS — R413 Other amnesia: Secondary | ICD-10-CM | POA: Diagnosis not present

## 2015-06-06 ENCOUNTER — Other Ambulatory Visit: Payer: Self-pay

## 2015-06-06 ENCOUNTER — Ambulatory Visit: Payer: Self-pay | Admitting: Pharmacist Clinician (PhC)/ Clinical Pharmacy Specialist

## 2015-06-06 DIAGNOSIS — I4891 Unspecified atrial fibrillation: Secondary | ICD-10-CM

## 2015-06-06 MED ORDER — LISINOPRIL 20 MG PO TABS: ORAL_TABLET | ORAL | Status: DC

## 2015-06-06 MED ORDER — DILTIAZEM HCL 60 MG PO TABS: ORAL_TABLET | ORAL | Status: DC

## 2015-06-06 NOTE — Telephone Encounter (Signed)
[facility] fax request 

## 2015-06-07 ENCOUNTER — Encounter: Payer: Medicare Other | Attending: Physical Medicine & Rehabilitation

## 2015-06-07 ENCOUNTER — Inpatient Hospital Stay: Payer: Medicare Other | Admitting: Physical Medicine & Rehabilitation

## 2015-06-07 DIAGNOSIS — R413 Other amnesia: Secondary | ICD-10-CM | POA: Diagnosis not present

## 2015-06-07 DIAGNOSIS — R410 Disorientation, unspecified: Secondary | ICD-10-CM | POA: Diagnosis not present

## 2015-06-07 DIAGNOSIS — R2681 Unsteadiness on feet: Secondary | ICD-10-CM | POA: Diagnosis not present

## 2015-06-07 DIAGNOSIS — I130 Hypertensive heart and chronic kidney disease with heart failure and stage 1 through stage 4 chronic kidney disease, or unspecified chronic kidney disease: Secondary | ICD-10-CM | POA: Diagnosis not present

## 2015-06-07 DIAGNOSIS — F329 Major depressive disorder, single episode, unspecified: Secondary | ICD-10-CM | POA: Diagnosis not present

## 2015-06-07 DIAGNOSIS — Z9181 History of falling: Secondary | ICD-10-CM | POA: Diagnosis not present

## 2015-06-07 DIAGNOSIS — M6281 Muscle weakness (generalized): Secondary | ICD-10-CM | POA: Diagnosis not present

## 2015-06-07 DIAGNOSIS — E1122 Type 2 diabetes mellitus with diabetic chronic kidney disease: Secondary | ICD-10-CM | POA: Diagnosis not present

## 2015-06-07 DIAGNOSIS — E785 Hyperlipidemia, unspecified: Secondary | ICD-10-CM | POA: Diagnosis not present

## 2015-06-07 DIAGNOSIS — I5022 Chronic systolic (congestive) heart failure: Secondary | ICD-10-CM | POA: Diagnosis not present

## 2015-06-07 DIAGNOSIS — I69398 Other sequelae of cerebral infarction: Secondary | ICD-10-CM | POA: Diagnosis not present

## 2015-06-07 DIAGNOSIS — N189 Chronic kidney disease, unspecified: Secondary | ICD-10-CM | POA: Diagnosis not present

## 2015-06-09 ENCOUNTER — Ambulatory Visit (INDEPENDENT_AMBULATORY_CARE_PROVIDER_SITE_OTHER): Payer: Medicare Other | Admitting: Internal Medicine

## 2015-06-09 ENCOUNTER — Encounter: Payer: Self-pay | Admitting: Internal Medicine

## 2015-06-09 VITALS — BP 150/80 | HR 75 | Temp 98.1°F | Resp 20 | Ht 69.0 in | Wt 159.1 lb

## 2015-06-09 DIAGNOSIS — I482 Chronic atrial fibrillation, unspecified: Secondary | ICD-10-CM

## 2015-06-09 DIAGNOSIS — R519 Headache, unspecified: Secondary | ICD-10-CM

## 2015-06-09 DIAGNOSIS — R51 Headache: Secondary | ICD-10-CM

## 2015-06-09 DIAGNOSIS — E039 Hypothyroidism, unspecified: Secondary | ICD-10-CM

## 2015-06-09 DIAGNOSIS — I610 Nontraumatic intracerebral hemorrhage in hemisphere, subcortical: Secondary | ICD-10-CM

## 2015-06-09 DIAGNOSIS — F0150 Vascular dementia without behavioral disturbance: Secondary | ICD-10-CM | POA: Diagnosis not present

## 2015-06-09 DIAGNOSIS — I10 Essential (primary) hypertension: Secondary | ICD-10-CM | POA: Diagnosis not present

## 2015-06-09 DIAGNOSIS — K219 Gastro-esophageal reflux disease without esophagitis: Secondary | ICD-10-CM

## 2015-06-09 DIAGNOSIS — G4700 Insomnia, unspecified: Secondary | ICD-10-CM

## 2015-06-09 DIAGNOSIS — R040 Epistaxis: Secondary | ICD-10-CM

## 2015-06-09 NOTE — Patient Instructions (Addendum)
Melatonin 5mg  to try at first for sleep--take one hour before bed.  If ineffective, increase to 10mg  at bedtime  Use a humidifier to help with the nose bleeding.

## 2015-06-09 NOTE — Progress Notes (Signed)
Patient ID: Nicole [name], female   DOB: 1931/06/17, 81 y.o.   MRN: [id number]   Location: [place] Provider: Rexene Edison. Mariea [name], D.O., C.M.D.  Code Status: full code Goals of Care: Advanced Directive information Does patient have an advance directive?: Yes, Type of Advance Directive: Living will;Healthcare Power of Attorney  Chief Complaint  Patient presents with  . Medical Management of Chronic Issues    HA, nose bleeds x 4days, confusion,     HPI: Patient is a 81 y.o. female with h/o DMII controlled with oral agents and diet, chronic diastolic chf, chronic afib, hypothyroidism, and hospitalization in Sept for nontraumatic subcortical hemorrhage (basal ganglia) in context of hypertensive emergency was seen in the office today for medical mgt of chronic diseases and primarily with concerns from her family about her staying at home due to worsening memory loss and functional status.    Hospitalized with intracranial bleed 9/12 to 04/12/15.  She had increased confusion, headache and hypertension to 166/103.  She was found to have a right basal ganglia intracerebral hemorrhage with intraventricular extension (no hydrocephalus) due to hypertension and warfarin coagulopathy.  INR was 3.06 only at that time.  Warfarin was reversed.  She was evaluated by Nicole [name] from neurosurgery and conservative mgt recommended.  Labetalol IV was given for her hypertension.    9/16, she moved to acute rehab.  At that time, the following was her functional and cognitive state:   Functional Status:  Mobility: Bed Mobility Overal bed mobility: Needs Assistance Bed Mobility: Supine to Sit Supine to sit: HOB elevated, Mod assist General bed mobility comments: pt up in chair Transfers Overall transfer level: Needs assistance Equipment used: Rolling walker (2 wheeled) Transfers: Sit to/from Stand Sit to Stand: Mod assist Stand pivot transfers: Min assist General transfer comment: max directional  verbal and tactile cues for anterior weight shift and to push up from chair Ambulation/Gait Ambulation/Gait assistance: Mod assist, +2 physical assistance, +2 safety/equipment Ambulation Distance (Feet): 50 Feet (x2) Assistive device: Rolling walker (2 wheeled), 2 person hand held assist Gait Pattern/deviations: Step-through pattern, Decreased stride length, Shuffle General Gait Details: trialed both with RW and bilat HHA. pt required max tactile cues to advance L LE and to clear foot. pt with improved sequencing when ambulating with bilat HHA most likely due to PT and Tech maintaining momentum and pt picking up habitual pattern Gait velocity: extremely slow Gait velocity interpretation: Below normal speed for age/gender    ADL: ADL Overall ADL's : Needs assistance/impaired Eating/Feeding: Set up Grooming: Supervision/safety, Set up Grooming Details (indicate cue type and reason): cues to attend and sequence Upper Body Bathing: Supervision/ safety, Set up, Sitting Upper Body Bathing Details (indicate cue type and reason): cues to complete tasks and attend Lower Body Bathing: Minimal assistance, Sit to/from stand Upper Body Dressing : Moderate assistance, Sitting Lower Body Dressing: Moderate assistance, Sit to/from stand Lower Body Dressing Details (indicate cue type and reason): trying to pull up her SCD hose as if they were her socks. Kept taking sock off with cues to donn socks Toilet Transfer: Minimal assistance, Ambulation Toileting- Clothing Manipulation and Hygiene: Minimal assistance, Sit to/from stand Functional mobility during ADLs: Minimal assistance, +2 for safety/equipment, Cueing for sequencing, Cueing for safety (short shuffling gait pattern. ) General ADL Comments: difficulty with initiation of movement and attending to tasks to cmoplete  Cognition: Cognition Overall Cognitive Status: Impaired/Different from baseline Orientation Level: Oriented to  person Cognition Arousal/Alertness: Lethargic Behavior During Therapy: Flat affect Overall  Cognitive Status: Impaired/Different from baseline Area of Impairment: Attention, Memory, Problem solving, Following commands, Safety/judgement Orientation Level: Disoriented to, Place Current Attention Level: Sustained Memory: Decreased short-term memory Following Commands: Follows one step commands with increased time, Follows one step commands inconsistently Safety/Judgement: Decreased awareness of deficits Awareness: Intellectual Problem Solving: Slow processing, Decreased initiation, Difficulty sequencing, Requires verbal cues, Requires tactile cues General Comments: pt very flat without initiation of any mvmt requiring max tactile cues to complete task  While there:  The patient received weekly collaborative interdisciplinary team conferences to discuss estimated length of stay, family teaching, and any barriers to discharge. She was ambulating 200 feet at a slow speed, cues for safety and step length, maintained dynamic standing balance with supervision while retrieving towels in a laundry basket on the floor, the patient's left side. She could ambulate to the gym while carrying her laundry basket. Gather her belongings for activities of daily living and homemaking. Due to limited assistance at home, it was felt skilled nursing facility was needed. Bed becoming available on May 02, 2015 and was transferred to [facility] for rehab.  She was not been seen for a hospital f/u.   Has been home now for 3 going on 4 weeks. Home 10/28.  She's been supervised 24 hrs by a combination of her children, sisters, and 2 shifts of caregivers in the daytime.    Every now and then, has headaches.  Has also been dizzy on walking and has difficulty walking straight.  Is not using a cane or walker--has walker she does not use.  No falls since home.  She does not think she's at her house.   Mixes up  her living son, Nicole [name], with one who is deceased for several years.  Pt thinks she sleeps well, but has had difficulty with epistaxis in the middle of the night.   She uses the restroom a lot to urinate.  She is continent. Her CNA makes sure she takes her meds.  Pt washes herself.  CNA also makes sure she eats.  Will get mixed up during the day.  May put things in the wrong places--wallet missing.  Doing her own bathing and dressing.  At times, gets a little help and CNA spots her and makes sure she puts things on properly.  Does not like a lot of people coming in and out of the house.  Says they don't let her do anything.  She made bread pudding with guidance.  Played piano the other day which made her children very happy.  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 MMSE down to 16 from 29/30 2 years ago.  Failed clock. Family would like imaging repeated sooner from Sept.  May also be hallucinating.  She thought two children were with them.  CT will be repeated 06/13/15.   Have taken knobs off oven, securing doors.  Does crossword puzzles.  Signed her whole name today.  Could not when she left [employer]    Review of Systems:  ROS  Past Medical History  Diagnosis Date  . Colon cancer ([place]) 07/1997  . Hypertension   . Diabetes mellitus   . CHF (congestive heart failure) ([place])   . AAA (abdominal aortic aneurysm) ([place])   . Atrial fibrillation ([place])   . Hypercholesteremia   . Kidney stone     renal calculi  . Small bowel obstruction due to adhesions ([place]) 05/16/2012  . Hypothyroidism   . Arthritis   . Anemia   .  Cholelithiasis   . Perforation of bile duct   . Hemorrhage of rectum and anus   . Swelling, mass, or lump in head and neck   . Cervicalgia   . Hypoglycemia, unspecified   . Pain in joint, lower leg   . Anxiety   . Shortness of breath   . Acute upper respiratory infections of unspecified site   . Depression   . Thoracic aneurysm without mention of rupture   .  GERD (gastroesophageal reflux disease)   . Proteinuria   . Other specified cardiac dysrhythmias(427.89)   . Congestive heart failure, unspecified   . Electrolyte and fluid disorders not elsewhere classified   . Other chronic allergic conjunctivitis   . Hypopotassemia   . Chronic systolic heart failure ([place])   . Dizziness and giddiness   . Malignant neoplasm of colon, unspecified site   . Other and unspecified hyperlipidemia   . Gout, unspecified   . Atrial fibrillation ([place])   . Aortic aneurysm of unspecified site without mention of rupture   . Pain in joint, site unspecified   . Palpitations   . CKD (chronic kidney disease)   . Diabetes mellitus [facility])     Past Surgical History  Procedure Laterality Date  . Hemicolectomy  08/11/1997  . Kidney stone surgery    . Colon surgery      to remove colon ca  . Cholecystectomy  05/14/2012    Procedure: LAPAROSCOPIC CHOLECYSTECTOMY WITH INTRAOPERATIVE CHOLANGIOGRAM;  Surgeon: Haywood Lasso, MD;  Location: [facility];  Service: General;  Laterality: N/A;  . Ercp  05/17/2012    Procedure: ENDOSCOPIC RETROGRADE CHOLANGIOPANCREATOGRAPHY (ERCP);  Surgeon: Missy Sabins, MD;  Location: [place];  Service: Gastroenterology;  Laterality: N/A;  . Ercp  08/12/2012    Procedure: ENDOSCOPIC RETROGRADE CHOLANGIOPANCREATOGRAPHY (ERCP);  Surgeon: Missy Sabins, MD;  Location: [facility] ENDOSCOPY;  Service: Endoscopy;  Laterality: N/A;  . Transthoracic echocardiogram  11/2009    EF 45-50%, mild-mod AV regurg; mild MR; mod TR; ascending aorta mildly dilated  . Cardiac catheterization  2008    moderate severe pulm HTN; with elevted pulm capillary wedge pressure     Allergies  Allergen Reactions  . Iohexol      Code: VOM, Desc: PT REPORTS VOMITING W/ IVP DYE- ARS 12/26/08, Onset Date: [id number]   . Z-Pak [Azithromycin]   . Iodinated Diagnostic Agents Nausea Only    PT HAS HAD SINGULAR INCIDENT OF NAUSEA  W/ IV CONTYRAST INJECTION, NONE IF INJECTED SLOWLY//A.[place]       Medication List       This list is accurate as of: 06/09/15 10:05 AM.  Always use your most recent med list.               allopurinol 100 MG tablet  Commonly known as:  ZYLOPRIM  1 by mouth daily for gout     cloNIDine 0.2 MG tablet  Commonly known as:  CATAPRES  Take one tablet by mouth three times daily for hypertension     diltiazem 60 MG tablet  Commonly known as:  CARDIZEM  Take one tablet by mouth four times daily for hypertension     levothyroxine 50 MCG tablet  Commonly known as:  SYNTHROID, LEVOTHROID  Take one tablet by mouth 30 minutes before breakfast once daily for thyroid     lisinopril 20 MG tablet  Commonly known as:  PRINIVIL,ZESTRIL  Take one tablet by mouth twice daily for hypertension     metoprolol 50  MG tablet  Commonly known as:  LOPRESSOR  Take 50 mg by mouth 2 (two) times daily.     Potassium Gluconate 2.5 MEQ Tabs  Take four capsules by mouth twice daily for potassium supplement     PROTONIX 40 mg/20 mL Pack  Generic drug:  pantoprazole sodium  Take 40 mg by mouth daily.        Health Maintenance  Topic Date Due  . OPHTHALMOLOGY EXAM  03/22/1941  . URINE MICROALBUMIN  03/22/1941  . ZOSTAVAX  03/21/1993  . HEMOGLOBIN A1C  10/03/2015  . INFLUENZA VACCINE  02/21/2016  . FOOT EXAM  02/24/2016  . TETANUS/TDAP  09/16/2021  . DEXA SCAN  Completed  . PNA vac Low Risk Adult  Completed    Physical Exam: Filed Vitals:   06/09/15 0912  BP: 150/80  Pulse: 75  Temp: 98.1 F (36.7 C)  TempSrc: Oral  Resp: 20  Height: 5\' 9"  (1.753 m)  Weight: 159 lb 1.6 oz (72.167 kg)  SpO2: 94%   Body mass index is 23.48 kg/(m^2). Physical Exam  Labs reviewed: Basic Metabolic Panel:  Recent Labs  02/17/15 0810  04/05/15 0819  04/23/15 0720 04/27/15 0936 05/02/15 0624  NA 145*  < > 144  < > 134* 135 135  K 3.7  < > 3.3*  < > 4.2 4.3 4.3  CL 100  < > 110  < > 103 101 101  CO2 26  < > 26  < > 24 26 25   GLUCOSE 54*  < > 139*  < >  161* 240* 150*  BUN 12  < > 10  < > 15 8 9   CREATININE 1.23*  < > 1.10*  < > 1.14* 1.13* 1.23*  CALCIUM 9.0  < > 8.9  < > 8.9 9.1 8.9  TSH 2.200  --  2.053  --   --   --   --   < > = values in this interval not displayed. Liver Function Tests:  Recent Labs  10/04/14 0922 04/04/15 0945 04/13/15 0546  AST 23 22 13*  ALT 13 10* 8*  ALKPHOS 107 91 71  BILITOT 0.9 1.0 0.8  PROT 7.7 7.8 6.4*  ALBUMIN 3.7 3.6 2.5*   No results for input(s): LIPASE, AMYLASE in the last 8760 hours. No results for input(s): AMMONIA in the last 8760 hours. CBC:  Recent Labs  02/17/15 0810 04/04/15 0945  04/10/15 0237 04/12/15 2115 04/13/15 0546  WBC 6.1 7.1  < > 15.2* 10.8* 8.7  NEUTROABS 4.2 6.1  --   --   --  6.7  HGB  --  12.8  < > 13.2 12.2 11.7*  HCT 39.5 37.4  < > 37.5 34.9* 34.2*  MCV  --  77.1*  < > 77.5* 76.9* 76.5*  PLT  --  329  < > 282 259 297  < > = values in this interval not displayed. Lipid Panel:  Recent Labs  02/17/15 0810 04/05/15 0819  CHOL 176 161  HDL 70 43  LDLCALC 92 97  TRIG 69 104  CHOLHDL 2.5 3.7   Lab Results  Component Value Date   HGBA1C 5.9* 04/05/2015    Procedures since last visit: Ct Head Wo Contrast  04/05/2015 IMPRESSION: Size stable ~ 4 cm right basal ganglia hematoma. Intraventricular hemorrhage has redistributed, accounting for newly seen trace subarachnoid blood. Obstructive right lateral ventriculomegaly which is stable.   04/04/2015 IMPRESSION: Large acute hemorrhage centered in the region of the right  caudate nucleus with rupture into the lateral ventricle and evidence of developing hydrocephalus and trapping of the right lateral ventricle. There is a mild amount of midline shift to the left. Parenchymal hemorrhage measures roughly 3.2 x 4.6 x 3.0 cm. This is most likely a hypertensive parenchymal bleed.   2D Echocardiogram - Left ventricle: Systolic function was normal. The estimated ejection fraction was in the range of 55% to  60%. - Aortic valve: There was mild regurgitation. - Mitral valve: There was mild to moderate regurgitation directed centrally. - Left atrium: The atrium was moderately dilated. - Right atrium: The atrium was moderately dilated. - Pulmonic valve: There was moderate regurgitation directed centrally. - Pulmonary arteries: Systolic pressure was mildly increased. PA peak pressure: 43 mm Hg (S).  EKG atrial fibrillation, rate 82. For complete results please see formal report.  Assessment/Plan 1. Nontraumatic subcortical hemorrhage of right cerebral hemisphere (HCC) -with residual unsteady gait and cognitive losses -not using her walker as directed -is getting 24 hr care at home at this time  2. Essential hypertension -bp slightly elevated today -cont cardizem, lisinopril, lopressor for bp -if remains elevated next visit, would consider making adjustments  3. Hypothyroidism, unspecified hypothyroidism type -cont synthroid 9mcg -last tsh was normal  4. Chronic atrial fibrillation (HCC) -off anticoagulation after a hematoma on her chin, prior multiple episodes of epistaxis, and the hemorrhagic infarct -rate is controlled with diltiazem and lopressor  5. Gastroesophageal reflux disease, esophagitis presence not specified -no recent symptoms -discussed discontinuing PPI, but her son would like her to take prn (explained it's not effective that way)  6. Subcortical vascular dementia, without behavioral disturbance -suspect this is the type due to her subcortical hemorrhage while on warfarin and hypertensive -cont 24 hr care at home as long as possible -she has made great physical headway, but her cognitive state is much worse that prior to the event of course -see functional assessment above  7. Epistaxis -due to dry heat, I suspect -advised to use a humidifier to help with this  8. Frequent headaches -due to intracranial bleeding -will monitor BP b/c may need increase in  medication now that she is out from the acute bleed  9. Insomnia -advised to try melatonin 5mg  and may go up to 10mg  max one hour before bed   Labs/tests ordered: has appt next month with labs before  Next appt:  07/01/2015   Saliyah Gillin L. Arhum Peeples, D.O. [facility] [address], [place] [address] Cell Phone (Mon-Fri 8am-5pm):  [phone] On Call:  [phone] & follow prompts after 5pm & weekends Office Phone:  [phone] Office Fax:  [phone]

## 2015-06-13 ENCOUNTER — Telehealth: Payer: Self-pay

## 2015-06-13 ENCOUNTER — Encounter: Payer: Self-pay | Admitting: Neurology

## 2015-06-13 ENCOUNTER — Ambulatory Visit (INDEPENDENT_AMBULATORY_CARE_PROVIDER_SITE_OTHER): Payer: Medicare Other | Admitting: Neurology

## 2015-06-13 VITALS — BP 104/68 | HR 73 | Ht 68.0 in | Wt 160.8 lb

## 2015-06-13 DIAGNOSIS — R413 Other amnesia: Secondary | ICD-10-CM

## 2015-06-13 DIAGNOSIS — F0151 Vascular dementia with behavioral disturbance: Secondary | ICD-10-CM

## 2015-06-13 DIAGNOSIS — F0150 Vascular dementia without behavioral disturbance: Secondary | ICD-10-CM | POA: Insufficient documentation

## 2015-06-13 MED ORDER — DONEPEZIL HCL 5 MG PO TABS: 5.0000 mg | ORAL_TABLET | Freq: Every day | ORAL | Status: DC

## 2015-06-13 NOTE — Telephone Encounter (Signed)
Rn talk to [facility] at [facility] about patients appt.While on the phone pt check in at [facility]. Rn told Nicole [name] patient was here.

## 2015-06-13 NOTE — Telephone Encounter (Signed)
Nicole [name] POA came to office to ask if patient was to be taking Warfarin. Left message for Dr. Mariea Clonts. Called [name] back no coumadin, just ASA 81 mg as per neurologist. She appreciated the call back.Marland Kitchen

## 2015-06-13 NOTE — Telephone Encounter (Signed)
LFt  vm for Nicole [name] Pulse at [facility] about patients appointment today schedule at 0200pm.Per appt [facility] was unable to confirm appt with anyone. Rn will call back after 0830, unable to find [facility] DON extension number.

## 2015-06-13 NOTE — Progress Notes (Signed)
[facility] [address] [place]. [place] [address] [phone]       OFFICE FOLLOW-UP NOTE  Nicole [name] Date of Birth:  [birth year] Medical Record Number:  [id number]   HPI: 36 year African-American lady seen today for first office follow-up visit following admission for right basal ganglia intracerebral hemorrhage in September 2016. She is accompanied today by her sister, son and caregiver.Nicole [name] is an 81 y.o. female history of atrial fibrillation on Coumadin, hypertension, hyperlipidemia, diabetes mellitus, CHF and chronic kidney disease, brought to the emergency room after being found on the floor home this morning. She was noted to be somewhat confused last night. She complained of feeling of wooziness and slight headache at 3:30 PM yesterday. She has no previous history of stroke nor TIA. CT scan of her head showed fairly large intracerebral hemorrhage involving the right caudate and extending into right ventricle with mass effect with mild midline shift as well as right ventricular enlargement. She has been evaluated by Dr. Saintclair Halsted of the Neurosurgery Service. Ventricular drain is not felt to be warranted at this time. Cerebral hematoma not considered operable. Patient is being admitted to neuro intensive care unit for further management, including immediate coagulation reversal. INR was 3.6. Patient's blood pressure was elevated at 166/103. She was given labetalol IV for immediate management. LSN: 3:30 PM on 04/03/2015 tPA Given: No: Acute ICH... She was admitted to the neurological intensive care unit for close neurological monitoring and blood pressure control. INR on admission was 3.6 which was reversed. Neurosurgery Dr. Saintclair Halsted was consulted but advise conservative follow-up. Patient did not need ventriculostomy at craniotomy. Follow-up CT scan showed stable appearance of the basal ganglia hemorrhage. Transthoracic echo showed normal ejection fraction. Carotid  ultrasound showed no significant extracranial stenosis. Patient was cleared by speech therapist for mechanical soft diet. She was seen by physical occupational therapy and felt to be a good patient for inpatient rehabilitation patient was transferred on 04/12/15. Patient completed a comprehensive rehabilitation program and has now been home for a month. She has a caregiver during the day and the family members take turns to spend the night with her. She seems to be doing quite well from physical standpoint she is able to walk independently without a cane or walker however she has had significant decline in her cognition. She has noticed worsening of her memory as well as hallucinations and confusion as well as some agitation. Patient apparently had no prior history of memory concerns or dementia. She had a recently documented Mini-Mental status score exam: 16/30 with significantly declined from 29/30 from 2 years ago in her primary physician Dr. Jonelle Sidle Reid's office on 06/09/15.  ROS:   14 system review of systems is positive for  confusion, memory loss, headache, not enough sleep, hallucinations, agitation and all other systems negative  PMH:  Past Medical History  Diagnosis Date  . Colon cancer ([place]) 07/1997  . Hypertension   . Diabetes mellitus   . CHF (congestive heart failure) ([place])   . AAA (abdominal aortic aneurysm) ([place])   . Atrial fibrillation ([place])   . Hypercholesteremia   . Kidney stone     renal calculi  . Small bowel obstruction due to adhesions ([place]) 05/16/2012  . Hypothyroidism   . Arthritis   . Anemia   . Cholelithiasis   . Perforation of bile duct   . Hemorrhage of rectum and anus   . Swelling, mass, or lump in head and neck   .  Cervicalgia   . Hypoglycemia, unspecified   . Pain in joint, lower leg   . Anxiety   . Shortness of breath   . Acute upper respiratory infections of unspecified site   . Depression   . Thoracic aneurysm without mention of rupture   . GERD  (gastroesophageal reflux disease)   . Proteinuria   . Other specified cardiac dysrhythmias(427.89)   . Congestive heart failure, unspecified   . Electrolyte and fluid disorders not elsewhere classified   . Other chronic allergic conjunctivitis   . Hypopotassemia   . Chronic systolic heart failure ([place])   . Dizziness and giddiness   . Malignant neoplasm of colon, unspecified site   . Other and unspecified hyperlipidemia   . Gout, unspecified   . Atrial fibrillation ([place])   . Aortic aneurysm of unspecified site without mention of rupture   . Pain in joint, site unspecified   . Palpitations   . CKD (chronic kidney disease)   . Diabetes mellitus ([place])     Social History:  Social History   Social History  . Marital Status: Widowed    Spouse Name: N/A  . Number of Children: N/A  . Years of Education: N/A   Occupational History  . Not on file.   Social History Main Topics  . Smoking status: Never Smoker   . Smokeless tobacco: Never Used  . Alcohol Use: No  . Drug Use: No  . Sexual Activity: No   Other Topics Concern  . Not on file   Social History Narrative    Medications:   Current Outpatient Prescriptions on File Prior to Visit  Medication Sig Dispense Refill  . allopurinol (ZYLOPRIM) 100 MG tablet 1 by mouth daily for gout    . cloNIDine (CATAPRES) 0.2 MG tablet Take one tablet by mouth three times daily for hypertension    . diltiazem (CARDIZEM) 60 MG tablet Take one tablet by mouth four times daily for hypertension 360 tablet 1  . levothyroxine (SYNTHROID, LEVOTHROID) 50 MCG tablet Take one tablet by mouth 30 minutes before breakfast once daily for thyroid    . lisinopril (PRINIVIL,ZESTRIL) 20 MG tablet Take one tablet by mouth twice daily for hypertension 180 tablet 1  . metoprolol (LOPRESSOR) 50 MG tablet Take 50 mg by mouth 2 (two) times daily.    . pantoprazole sodium (PROTONIX) 40 mg/20 mL PACK Take 40 mg by mouth daily.     . Potassium Gluconate 2.5 MEQ TABS  Take four capsules by mouth twice daily for potassium supplement     No current facility-administered medications on file prior to visit.    Allergies:   Allergies  Allergen Reactions  . Iohexol      Code: VOM, Desc: PT REPORTS VOMITING W/ IVP DYE- ARS 12/26/08, Onset Date: [id number]   . Z-Pak [Azithromycin]   . Iodinated Diagnostic Agents Nausea Only    PT HAS HAD SINGULAR INCIDENT OF NAUSEA  W/ IV CONTYRAST INJECTION, NONE IF INJECTED SLOWLY//A.[place]    Physical Exam General: Frail elderly African-American lady, seated, in no evident distress Head: head normocephalic and atraumatic.  Neck: supple with no carotid or supraclavicular bruits Cardiovascular: regular rate and rhythm, no murmurs Musculoskeletal: no deformity Skin:  no rash/petichiae Vascular:  Normal pulses all extremities Filed Vitals:   06/13/15 1427  BP: 104/68  Pulse: 73   Neurologic Exam Mental Status: Awake and fully alert. Oriented to place and time. Recent and remote memory poort. Attention span, concentration and fund of knowledge  poor. Mood and affect appropriate. Diminished recall 0/3. Animal naming 6 only. Clock drawing 3/4.  Mini-Mental status exam score 16/30 from Dr. Jonelle Sidle Reid's office on 06/09/2015 Cranial Nerves: Fundoscopic exam reveals sharp disc margins. Pupils equal, briskly reactive to light. Extraocular movements full without nystagmus. Visual fields full to confrontation. Hearing intact. Facial sensation intact. Face, tongue, palate moves normally and symmetrically.  Motor: Normal bulk and tone. Normal strength in all tested extremity muscles. Mild diminished fine finger movements on the left. Mild weakness of left grip compared to right. Orbits right over left approximately. Sensory.: intact to touch ,pinprick .position and vibratory sensation.  Coordination: Rapid alternating movements normal in all extremities. Finger-to-nose and heel-to-shin performed accurately bilaterally. Gait and  Station: Arises from chair without difficulty. Stance is normal. Gait demonstrates normal stride length and balance mild dragging of the left leg while walking.. Not able to heel, toe and tandem walk without difficulty.  Reflexes: 1+ and symmetric. Toes downgoing.     ASSESSMENT: 81 year old African-American lady with right basal ganglia hemorrhage in September 2016 secondary to hypertension and warfarin coagulopathy with residual mild new vascular dementia    PLAN: I had a long discussion with the patient, sister, son and caregiver regarding her recent intracerebral hemorrhage and resultant vascular dementia and memory loss, personally reviewed imaging studies, hospital evaluation results and answered questions. I recommend checking lab work for treatable causes of memory loss, EEG and follow-up CT scan of the head. Restart aspirin 81 mg daily  as she has atrial fibrillation and remains at risk for recurrent strokes Trial of Aricept 5 mg daily with food increase if tolerated without side effects to 10 mg daily. Have discussed possible side effects with the patient and family and asked them to call me if needed. She is likely going to need close supervision for a long time and family seems prepared to provide this.Greater than 50% of time during this 25 minute visit was spent on counseling,explanation of diagnosis, planning of further management, discussion with patient and family and coordination of care  Antony Contras, MD  V  Note: This document was prepared with digital dictation and possible smart phrase technology. Any transcriptional errors that result from this process are unintentional 84 year

## 2015-06-13 NOTE — Patient Instructions (Signed)
I had a long discussion with the patient, sister, son and caregiver regarding her recent intracerebral hemorrhage and resultant vascular dementia and memory loss, personally reviewed imaging studies, hospital evaluation results and answered questions. I recommend checking lab work for treatable causes of memory loss, EEG and follow-up CT scan of the head. Restart aspirin 81 mg daily  as she has atrial fibrillation and remains at risk for recurrent strokes Trial of Aricept 5 mg daily with food increase if tolerated without side effects to 10 mg daily. Have discussed possible side effects with the patient and family and asked them to call me if needed. She is likely going to need close supervision for a long time and family seems prepared to provide this. Vascular Dementia Vascular dementia is a common cause of dementia in the elderly. Dementia is a condition that affects the brain and causes people to not think well or act normally. Vascular dementia is one type of dementia. It occurs when blood clots block small blood vessels in the brain and destroy brain tissue. Likely risk factors are high blood pressure and advanced age. This disease can cause stroke, migraine-like headaches, and psychiatric disturbances.  SYMPTOMS   Confusion.  Problems with recent memory.  Wandering or getting lost in familiar places.  Loss of bladder or bowel control (incontinence).  Unsteady gait.  Poor attention and concentration.  Emotional problems such as laughing or crying inappropriately.  Difficulty following instructions.  Problems handling money.  Depression.  Difficulty planning ahead. Usually the damage is slight at first. Over time, as more small vessels are blocked, there is a gradual mental decline. However, symptoms may begin suddenly. Symptoms may be very similar to Alzheimer's disease. The two forms of dementia may occur together. Vascular dementia typically begins between the ages of 47 and 21. It  affects men more often than women. TREATMENT   Currently there is no treatment for vascular dementia that can reverse the damage that has already occurred.  Treatment focuses on prevention of additional brain damage and improvement of symptoms.  It is important to treat the risk factors for vascular dementia, such as keeping blood pressure under control, treating diabetes, lowering cholesterol, and stop smoking. PROGNOSIS   Prognosis for patients is generally poor. Individuals with the disease may improve for short periods of time, then get worse again. Early treatment and management of blood pressure and other risk factors may prevent further worsening of the disorder.   This information is not intended to replace advice given to you by your health care provider. Make sure you discuss any questions you have with your health care provider.   Document Released: 06/29/2002 Document Revised: 10/01/2011 Document Reviewed: 10/20/2014 Elsevier Interactive Patient Education 2016 [place] you for coming to see [place] at [facility]. I hope we have been able to provide you high quality care today. You may receive a patient satisfaction survey over the next few weeks. We would appreciate your feedback and comments so that we may continue to improve ourselves and the health of our patients.

## 2015-06-14 ENCOUNTER — Other Ambulatory Visit: Payer: Self-pay | Admitting: *Deleted

## 2015-06-14 DIAGNOSIS — I69398 Other sequelae of cerebral infarction: Secondary | ICD-10-CM | POA: Diagnosis not present

## 2015-06-14 DIAGNOSIS — E785 Hyperlipidemia, unspecified: Secondary | ICD-10-CM | POA: Diagnosis not present

## 2015-06-14 DIAGNOSIS — R2681 Unsteadiness on feet: Secondary | ICD-10-CM | POA: Diagnosis not present

## 2015-06-14 DIAGNOSIS — E1122 Type 2 diabetes mellitus with diabetic chronic kidney disease: Secondary | ICD-10-CM | POA: Diagnosis not present

## 2015-06-14 DIAGNOSIS — I130 Hypertensive heart and chronic kidney disease with heart failure and stage 1 through stage 4 chronic kidney disease, or unspecified chronic kidney disease: Secondary | ICD-10-CM | POA: Diagnosis not present

## 2015-06-14 DIAGNOSIS — Z9181 History of falling: Secondary | ICD-10-CM | POA: Diagnosis not present

## 2015-06-14 DIAGNOSIS — R410 Disorientation, unspecified: Secondary | ICD-10-CM | POA: Diagnosis not present

## 2015-06-14 DIAGNOSIS — I5022 Chronic systolic (congestive) heart failure: Secondary | ICD-10-CM | POA: Diagnosis not present

## 2015-06-14 DIAGNOSIS — R413 Other amnesia: Secondary | ICD-10-CM | POA: Diagnosis not present

## 2015-06-14 DIAGNOSIS — F329 Major depressive disorder, single episode, unspecified: Secondary | ICD-10-CM | POA: Diagnosis not present

## 2015-06-14 DIAGNOSIS — N189 Chronic kidney disease, unspecified: Secondary | ICD-10-CM | POA: Diagnosis not present

## 2015-06-14 DIAGNOSIS — M6281 Muscle weakness (generalized): Secondary | ICD-10-CM | POA: Diagnosis not present

## 2015-06-14 MED ORDER — CLONIDINE HCL 0.2 MG PO TABS: ORAL_TABLET | ORAL | Status: DC

## 2015-06-15 ENCOUNTER — Ambulatory Visit (INDEPENDENT_AMBULATORY_CARE_PROVIDER_SITE_OTHER): Payer: Medicare Other | Admitting: Internal Medicine

## 2015-06-15 ENCOUNTER — Encounter: Payer: Self-pay | Admitting: Internal Medicine

## 2015-06-15 VITALS — BP 146/74 | HR 62 | Ht 68.0 in | Wt 162.0 lb

## 2015-06-15 DIAGNOSIS — I4819 Other persistent atrial fibrillation: Secondary | ICD-10-CM

## 2015-06-15 DIAGNOSIS — I160 Hypertensive urgency: Secondary | ICD-10-CM

## 2015-06-15 DIAGNOSIS — I613 Nontraumatic intracerebral hemorrhage in brain stem: Secondary | ICD-10-CM

## 2015-06-15 DIAGNOSIS — I4891 Unspecified atrial fibrillation: Secondary | ICD-10-CM | POA: Diagnosis not present

## 2015-06-15 DIAGNOSIS — I481 Persistent atrial fibrillation: Secondary | ICD-10-CM

## 2015-06-15 DIAGNOSIS — I10 Essential (primary) hypertension: Secondary | ICD-10-CM

## 2015-06-15 LAB — DEMENTIA PANEL
HOMOCYSTEINE: 18.8 umol/L — AB (ref 0.0–15.0)
RPR: NONREACTIVE
TSH: 2.21 u[IU]/mL (ref 0.450–4.500)
Vitamin B-12: 361 pg/mL (ref 211–946)

## 2015-06-15 NOTE — Patient Instructions (Signed)
Your physician wants you to follow-up in: 6 months with Dr. Hilty. You will receive a reminder letter in the mail two months in advance. If you don't receive a letter, please call our office to schedule the follow-up appointment.    

## 2015-06-15 NOTE — Progress Notes (Signed)
OFFICE NOTE  Chief Complaint:   Hospital follow-up  Primary Care Physician: Hollace Kinnier, DO  HPI:  Nicole [name] is a 81 year old female with history of chronic atrial fibrillation and controlled ventricular response. She was previously followed by Dr. Rex Kras, has a history of congestive heart failure with an EF of about 45% and moderate MR in the past. She has a thoracic aneurysm which has been asymptomatic, as well as hypertension, diabetes, dyslipidemia, and recently was admitted for gallbladder surgery which was complicated by bleeding issues. She has been deemed not a good candidate for Coumadin due to a number of issues with compliance by Dr. Rex Kras, and has been on aspirin and Plavix. Unfortunately, with regard to the Plavix she had complicated bleeding postoperatively from her gallbladder surgery and Plavix was discontinued due to this. It turns out that there is little benefit to it additional Plavix on top of aspirin for stroke reduction. As far as her stroke risk is concerned, it is actually fairly high considering she is in chronic atrial fibrillation and does have a history of hypertension and diabetes and is a female with a CHADS2 score of 3, a CHADS VASc score of 3-1/2 or 4.  Based on this, I recommended starting her on Eliquis.   Nicole [name] returns today for followup. Unfortunately she was just in the hospital with epistaxis. This is following a period of time where she developed small bowel obstruction and had a nasogastric tube in place. This could contribute to possible erosions. She was seen by an ear nose and throat specialist to placed a nasal balloon and ultimately packed her nose. This packing was in for a week and was removed and no further bleeding was noted. It was not mentioned what the cause of her bleeding was. She was on adequate. She still has a high CHADSVASC score. We had recommended discontinuing her eloquence due to bleeding. She was also in A. fib with RVR  and I recommended increasing her diltiazem to 240 mg daily but that was not performed. She's had no further epistaxis.  I saw Nicole [name] back today in the office. Overall she reports feeling very well. Unfortunate she's recently been having significant falls. She says she's been falling out of bed amongst other things and eventually developed significant bruising and has a large resolving hematoma on her right chin. Fortunately there was no intracranial bleeding. She is maintained on warfarin but is not had a check of her INR since March. Her last check in our office was in December. It's unclear why she felt that she did not need to come back for ongoing monthly INR checks. This of warfarin, but fortunately her INR today is 2.7. She remains in atrial fibrillation with a CHADSVASC score of 4. She also has a history of thoracic aortic aneurysm measuring up to 4.8 cm. This is been stable and recently when she had a neck CT, this is partially visualized and also measured 4.8 cm.   Nicole [name] returns today for hospital follow-up. Unfortunately in September she was hospitalized after acute confusion. Seen in the ER a CT scan indicated an acute intracerebral hemorrhage. This was significant with mass effect. Neurology and neurosurgery were consult it. She was on warfarin however INR was just above 3. This was urgently reversed. Blood pressure however was elevated at over 160/120. It was thought that this bleeding was due to hypertensive emergency. Subsequently she had a long hospitalization and rehabilitation. She was taken off of warfarin and  recently followed up with her neurologist , who suggested she could go on low-dose aspirin. She understands after and explanation by me today that the risk of ischemic stroke is still quite high.  Her CHADSVASC or is at least 6.  We did spend some time discussing an alternative which may be the watchman left atrial appendage occluder device.  PMHx:   Past Medical History    Diagnosis Date  . Colon cancer ([place]) 07/1997  . Hypertension   . Diabetes mellitus   . CHF (congestive heart failure) ([place])   . AAA (abdominal aortic aneurysm) ([place])   . Atrial fibrillation ([place])   . Hypercholesteremia   . Kidney stone     renal calculi  . Small bowel obstruction due to adhesions ([place]) 05/16/2012  . Hypothyroidism   . Arthritis   . Anemia   . Cholelithiasis   . Perforation of bile duct   . Hemorrhage of rectum and anus   . Swelling, mass, or lump in head and neck   . Cervicalgia   . Hypoglycemia, unspecified   . Pain in joint, lower leg   . Anxiety   . Shortness of breath   . Acute upper respiratory infections of unspecified site   . Depression   . Thoracic aneurysm without mention of rupture   . GERD (gastroesophageal reflux disease)   . Proteinuria   . Other specified cardiac dysrhythmias(427.89)   . Congestive heart failure, unspecified   . Electrolyte and fluid disorders not elsewhere classified   . Other chronic allergic conjunctivitis   . Hypopotassemia   . Chronic systolic heart failure ([place])   . Dizziness and giddiness   . Malignant neoplasm of colon, unspecified site   . Other and unspecified hyperlipidemia   . Gout, unspecified   . Atrial fibrillation ([place])   . Aortic aneurysm of unspecified site without mention of rupture   . Pain in joint, site unspecified   . Palpitations   . CKD (chronic kidney disease)   . Diabetes mellitus [facility])     Past Surgical History  Procedure Laterality Date  . Hemicolectomy  08/11/1997  . Kidney stone surgery    . Colon surgery      to remove colon ca  . Cholecystectomy  05/14/2012    Procedure: LAPAROSCOPIC CHOLECYSTECTOMY WITH INTRAOPERATIVE CHOLANGIOGRAM;  Surgeon: Haywood Lasso, MD;  Location: [facility];  Service: General;  Laterality: N/A;  . Ercp  05/17/2012    Procedure: ENDOSCOPIC RETROGRADE CHOLANGIOPANCREATOGRAPHY (ERCP);  Surgeon: Missy Sabins, MD;  Location: [facility];  Service:  Gastroenterology;  Laterality: N/A;  . Ercp  08/12/2012    Procedure: ENDOSCOPIC RETROGRADE CHOLANGIOPANCREATOGRAPHY (ERCP);  Surgeon: Missy Sabins, MD;  Location: [facility] ENDOSCOPY;  Service: Endoscopy;  Laterality: N/A;  . Transthoracic echocardiogram  11/2009    EF 45-50%, mild-mod AV regurg; mild MR; mod TR; ascending aorta mildly dilated  . Cardiac catheterization  2008    moderate severe pulm HTN; with elevted pulm capillary wedge pressure     FAMHx:  Family History  Problem Relation Age of Onset  . Heart disease Mother   . Stroke Father   . Hypertension Daughter   . Hypertension Son   . Diabetes Sister   . Hypertension Son     SOCHx:   reports that she has never smoked. She has never used smokeless tobacco. She reports that she does not drink alcohol or use illicit drugs.  ALLERGIES:  Allergies  Allergen Reactions  . Iohexol  Code: VOM, Desc: PT REPORTS VOMITING W/ IVP DYE- ARS 12/26/08, Onset Date: [id number]   . Z-Pak [Azithromycin]   . Iodinated Diagnostic Agents Nausea Only    PT HAS HAD SINGULAR INCIDENT OF NAUSEA  W/ IV CONTYRAST INJECTION, NONE IF INJECTED SLOWLY//A.[place]    ROS: A comprehensive review of systems was negative except for: Constitutional: positive for Falls Neurological: positive for memory problems, weakness and Sequelae of recent stroke  HOME MEDS: Current Outpatient Prescriptions  Medication Sig Dispense Refill  . allopurinol (ZYLOPRIM) 100 MG tablet 1 by mouth daily for gout    . Blood Glucose Calibration (ACCU-CHEK AVIVA) SOLN See admin instructions.  99  . cloNIDine (CATAPRES) 0.2 MG tablet Take one tablet by mouth three times daily for hypertension 90 tablet 2  . diltiazem (CARDIZEM) 60 MG tablet Take one tablet by mouth four times daily for hypertension 360 tablet 1  . donepezil (ARICEPT) 5 MG tablet Take 1 tablet (5 mg total) by mouth at bedtime. Start one tablet daily x 4 weeks then two tablets daily 60 tablet 1  . EASY COMFORT LANCETS  MISC 3 (three) times daily. for testing  99  . levothyroxine (SYNTHROID, LEVOTHROID) 50 MCG tablet Take one tablet by mouth 30 minutes before breakfast once daily for thyroid    . lisinopril (PRINIVIL,ZESTRIL) 20 MG tablet Take one tablet by mouth twice daily for hypertension 180 tablet 1  . metoprolol (LOPRESSOR) 50 MG tablet Take 50 mg by mouth 2 (two) times daily.    . pantoprazole sodium (PROTONIX) 40 mg/20 mL PACK Take 40 mg by mouth daily.     . Potassium Gluconate 2.5 MEQ TABS Take four capsules by mouth twice daily for potassium supplement     No current facility-administered medications for this visit.    LABS/IMAGING: No results found for this or any previous visit (from the past 48 hour(s)). No results found.  VITALS: BP 146/74 mmHg  Pulse 62  Ht 5\' 8"  (1.727 m)  Wt 162 lb (73.483 kg)  BMI 24.64 kg/m2  EXAM: General appearance: alert and no distress Neck: no carotid bruit, no JVD and . Lungs: clear to auscultation bilaterally Heart: irregularly irregular rhythm Abdomen: soft, non-tender; bowel sounds normal; no masses,  no organomegaly Extremities: extremities normal, atraumatic, no cyanosis or edema Pulses: 2+ and symmetric Skin: Skin color, texture, turgor normal. No rashes or lesions Neurologic: Mental status: Slow thought process  EKG: A. Fib at 62, incomplete RBBB, non-specific ST and T wave changes  ASSESSMENT: 1. Chronic atrial fibrillation - CHADSVASC score of 6 (not on anticoagulation now due to [place]) 2. Recent stroke with intracerebral hemorrhage 3. History of congestive heart failure, EF of 45% 4. Hypertension 5. History of thoracic aneurysm - stable at 4.8 cm (09/2014) 6. Diabetes  7. Dyslipidemia 8. Chronic kidney disease  PLAN: 1.   [name] recently had an unfortunate stroke with intracerebral hemorrhage. Her INR was around 3 and she was reversed per protocol. Unfortunately, she has suffered some cognitive deficits from this. At this time, her  neurologist is recommending aspirin as there are few other safe options. I did discuss our newest procedure the Watchman, left atrial appendage occluder device. If it were possible for her to be anticoagulated for 2-3 months after the procedure, it can significantly reduce her embolic stroke risk going forward and obviate the need for warfarin (which she cannot take). The family wished for me to discuss this with Dr. Leonie Man and see if he feels she could  be a candidate for this.  Plan to contact them about further work-up for this device.  I have seen Nicole [name] is a 81 y.o. female in the office today who is being considered for a Watchman left atrial appendage closure device.  She has a history of permanent atrial fibrillation.  This patients CHA2DS2-VASc Score and unadjusted Ischemic Stroke Rate (% per year) is equal to 9.7 % stroke rate/year from a score of 6 which necessitates long term oral anticoagulation to prevent stroke.  Unfortunately, She is not felt to be a long term Warfarin candidate secondary to recent intracerebral hemorrhage and stroke.  The patients chart has been reviewed and I feel that they would be a candidate for short term oral anticoagulation.  Procedural risks for the Watchman implant have been reviewed with the patient including a 1% risk of stroke, 2% risk of perforation, 0.1% risk of device embolization.  Given the patient's poor candidacy for long-term oral anticoagulation, ability to tolerate short term oral anticoagulation I have recommended the watchman left atrial appendage closure system.  Pixie Casino, MD, [facility] Attending Cardiologist [place] C Hilty 06/15/2015, 5:54 PM

## 2015-06-17 DIAGNOSIS — F329 Major depressive disorder, single episode, unspecified: Secondary | ICD-10-CM | POA: Diagnosis not present

## 2015-06-17 DIAGNOSIS — R2681 Unsteadiness on feet: Secondary | ICD-10-CM | POA: Diagnosis not present

## 2015-06-17 DIAGNOSIS — R410 Disorientation, unspecified: Secondary | ICD-10-CM | POA: Diagnosis not present

## 2015-06-17 DIAGNOSIS — E1122 Type 2 diabetes mellitus with diabetic chronic kidney disease: Secondary | ICD-10-CM | POA: Diagnosis not present

## 2015-06-17 DIAGNOSIS — I69398 Other sequelae of cerebral infarction: Secondary | ICD-10-CM | POA: Diagnosis not present

## 2015-06-17 DIAGNOSIS — Z9181 History of falling: Secondary | ICD-10-CM | POA: Diagnosis not present

## 2015-06-17 DIAGNOSIS — E785 Hyperlipidemia, unspecified: Secondary | ICD-10-CM | POA: Diagnosis not present

## 2015-06-17 DIAGNOSIS — M6281 Muscle weakness (generalized): Secondary | ICD-10-CM | POA: Diagnosis not present

## 2015-06-17 DIAGNOSIS — R413 Other amnesia: Secondary | ICD-10-CM | POA: Diagnosis not present

## 2015-06-17 DIAGNOSIS — N189 Chronic kidney disease, unspecified: Secondary | ICD-10-CM | POA: Diagnosis not present

## 2015-06-17 DIAGNOSIS — I5022 Chronic systolic (congestive) heart failure: Secondary | ICD-10-CM | POA: Diagnosis not present

## 2015-06-17 DIAGNOSIS — I130 Hypertensive heart and chronic kidney disease with heart failure and stage 1 through stage 4 chronic kidney disease, or unspecified chronic kidney disease: Secondary | ICD-10-CM | POA: Diagnosis not present

## 2015-06-19 ENCOUNTER — Encounter (HOSPITAL_COMMUNITY): Payer: Self-pay | Admitting: Emergency Medicine

## 2015-06-19 ENCOUNTER — Emergency Department (HOSPITAL_COMMUNITY)
Admission: EM | Admit: 2015-06-19 | Discharge: 2015-06-19 | Disposition: A | Discharge status: Home / Self Care | Payer: Medicare Other | Attending: Emergency Medicine | Admitting: Emergency Medicine

## 2015-06-19 ENCOUNTER — Emergency Department (HOSPITAL_COMMUNITY)
Admission: EM | Admit: 2015-06-19 | Discharge: 2015-06-19 | Disposition: A | Discharge status: Home / Self Care | Payer: Medicare Other | Source: Home / Self Care | Attending: Emergency Medicine | Admitting: Emergency Medicine

## 2015-06-19 DIAGNOSIS — M109 Gout, unspecified: Secondary | ICD-10-CM

## 2015-06-19 DIAGNOSIS — M1990 Unspecified osteoarthritis, unspecified site: Secondary | ICD-10-CM | POA: Insufficient documentation

## 2015-06-19 DIAGNOSIS — I5022 Chronic systolic (congestive) heart failure: Secondary | ICD-10-CM

## 2015-06-19 DIAGNOSIS — E119 Type 2 diabetes mellitus without complications: Secondary | ICD-10-CM

## 2015-06-19 DIAGNOSIS — Z85038 Personal history of other malignant neoplasm of large intestine: Secondary | ICD-10-CM | POA: Insufficient documentation

## 2015-06-19 DIAGNOSIS — Z79899 Other long term (current) drug therapy: Secondary | ICD-10-CM

## 2015-06-19 DIAGNOSIS — F329 Major depressive disorder, single episode, unspecified: Secondary | ICD-10-CM | POA: Diagnosis not present

## 2015-06-19 DIAGNOSIS — E039 Hypothyroidism, unspecified: Secondary | ICD-10-CM | POA: Insufficient documentation

## 2015-06-19 DIAGNOSIS — I509 Heart failure, unspecified: Secondary | ICD-10-CM | POA: Diagnosis not present

## 2015-06-19 DIAGNOSIS — F419 Anxiety disorder, unspecified: Secondary | ICD-10-CM | POA: Insufficient documentation

## 2015-06-19 DIAGNOSIS — N189 Chronic kidney disease, unspecified: Secondary | ICD-10-CM | POA: Insufficient documentation

## 2015-06-19 DIAGNOSIS — Z9889 Other specified postprocedural states: Secondary | ICD-10-CM | POA: Insufficient documentation

## 2015-06-19 DIAGNOSIS — Z87442 Personal history of urinary calculi: Secondary | ICD-10-CM | POA: Insufficient documentation

## 2015-06-19 DIAGNOSIS — K219 Gastro-esophageal reflux disease without esophagitis: Secondary | ICD-10-CM

## 2015-06-19 DIAGNOSIS — E079 Disorder of thyroid, unspecified: Secondary | ICD-10-CM | POA: Insufficient documentation

## 2015-06-19 DIAGNOSIS — R040 Epistaxis: Secondary | ICD-10-CM | POA: Insufficient documentation

## 2015-06-19 DIAGNOSIS — Z7982 Long term (current) use of aspirin: Secondary | ICD-10-CM | POA: Insufficient documentation

## 2015-06-19 DIAGNOSIS — I129 Hypertensive chronic kidney disease with stage 1 through stage 4 chronic kidney disease, or unspecified chronic kidney disease: Secondary | ICD-10-CM | POA: Insufficient documentation

## 2015-06-19 DIAGNOSIS — I4891 Unspecified atrial fibrillation: Secondary | ICD-10-CM

## 2015-06-19 DIAGNOSIS — Z862 Personal history of diseases of the blood and blood-forming organs and certain disorders involving the immune mechanism: Secondary | ICD-10-CM | POA: Insufficient documentation

## 2015-06-19 LAB — CBC
HEMATOCRIT: 25.8 % — AB (ref 36.0–46.0)
Hemoglobin: 8.9 g/dL — ABNORMAL LOW (ref 12.0–15.0)
MCH: 25.9 pg — ABNORMAL LOW (ref 26.0–34.0)
MCHC: 34.5 g/dL (ref 30.0–36.0)
MCV: 75.2 fL — ABNORMAL LOW (ref 78.0–100.0)
Platelets: 277 10*3/uL (ref 150–400)
RBC: 3.43 MIL/uL — AB (ref 3.87–5.11)
RDW: 16.9 % — AB (ref 11.5–15.5)
WBC: 7.2 10*3/uL (ref 4.0–10.5)

## 2015-06-19 LAB — PROTIME-INR
INR: 1.26 (ref 0.00–1.49)
Prothrombin Time: 15.9 seconds — ABNORMAL HIGH (ref 11.6–15.2)

## 2015-06-19 MED ORDER — CEPHALEXIN 500 MG PO CAPS: 500.0000 mg | ORAL_CAPSULE | Freq: Four times a day (QID) | ORAL | Status: DC

## 2015-06-19 MED ORDER — OXYMETAZOLINE HCL 0.05 % NA SOLN
1.0000 | Freq: Once | NASAL | Status: AC
  Administered 2015-06-19: 1 via NASAL
  Filled 2015-06-19: qty 15

## 2015-06-19 MED ORDER — SILVER NITRATE-POT NITRATE 75-25 % EX MISC
1.0000 | Freq: Once | CUTANEOUS | Status: DC
  Filled 2015-06-19: qty 10

## 2015-06-19 NOTE — ED Provider Notes (Signed)
CSN: [id number]     Arrival date & time 06/19/15  [id number] History   First MD Initiated Contact with Patient 06/19/15 [phone]     Chief Complaint  Patient presents with  . Epistaxis     (Consider location/radiation/quality/duration/timing/severity/associated sxs/prior Treatment) HPI Comments: 81 y.o. Female with history of atrial fibrillation deemed not an anticoagulation candidate, HTN,  CHF presents for nose bleed.  The patient has had recurrent intermittent nosebleeds over the last month.  They usually occur in the morning and then resolve on their own.  This morning she had two and the second one they were not able to stop and so they brought her to the ER for evaluation.  The patient reports she feels that her left nostril has been more of the issue for her but her family and caregiver report that it has been the right nostril that they have seen her bleeding from.     Past Medical History  Diagnosis Date  . Colon cancer ([place]) 07/1997  . Hypertension   . Diabetes mellitus   . CHF (congestive heart failure) ([place])   . AAA (abdominal aortic aneurysm) ([place])   . Atrial fibrillation ([place])   . Hypercholesteremia   . Kidney stone     renal calculi  . Small bowel obstruction due to adhesions ([place]) 05/16/2012  . Hypothyroidism   . Arthritis   . Anemia   . Cholelithiasis   . Perforation of bile duct   . Hemorrhage of rectum and anus   . Swelling, mass, or lump in head and neck   . Cervicalgia   . Hypoglycemia, unspecified   . Pain in joint, lower leg   . Anxiety   . Shortness of breath   . Acute upper respiratory infections of unspecified site   . Depression   . Thoracic aneurysm without mention of rupture   . GERD (gastroesophageal reflux disease)   . Proteinuria   . Other specified cardiac dysrhythmias(427.89)   . Congestive heart failure, unspecified   . Electrolyte and fluid disorders not elsewhere classified   . Other chronic allergic conjunctivitis   . Hypopotassemia   .  Chronic systolic heart failure ([place])   . Dizziness and giddiness   . Malignant neoplasm of colon, unspecified site   . Other and unspecified hyperlipidemia   . Gout, unspecified   . Atrial fibrillation ([place])   . Aortic aneurysm of unspecified site without mention of rupture   . Pain in joint, site unspecified   . Palpitations   . CKD (chronic kidney disease)   . Diabetes mellitus [facility])    Past Surgical History  Procedure Laterality Date  . Hemicolectomy  08/11/1997  . Kidney stone surgery    . Colon surgery      to remove colon ca  . Cholecystectomy  05/14/2012    Procedure: LAPAROSCOPIC CHOLECYSTECTOMY WITH INTRAOPERATIVE CHOLANGIOGRAM;  Surgeon: Haywood Lasso, MD;  Location: [place];  Service: General;  Laterality: N/A;  . Ercp  05/17/2012    Procedure: ENDOSCOPIC RETROGRADE CHOLANGIOPANCREATOGRAPHY (ERCP);  Surgeon: Missy Sabins, MD;  Location: [facility];  Service: Gastroenterology;  Laterality: N/A;  . Ercp  08/12/2012    Procedure: ENDOSCOPIC RETROGRADE CHOLANGIOPANCREATOGRAPHY (ERCP);  Surgeon: Missy Sabins, MD;  [facility] ENDOSCOPY;  Service: Endoscopy;  Laterality: N/A;  . Transthoracic echocardiogram  11/2009    EF 45-50%, mild-mod AV regurg; mild MR; mod TR; ascending aorta mildly dilated  . Cardiac catheterization  2008    moderate severe pulm  HTN; with elevted pulm capillary wedge pressure    Family History  Problem Relation Age of Onset  . Heart disease Mother   . Stroke Father   . Hypertension Daughter   . Hypertension Son   . Diabetes Sister   . Hypertension Son    Social History  Substance Use Topics  . Smoking status: Never Smoker   . Smokeless tobacco: Never Used  . Alcohol Use: No   OB History    No data available     Review of Systems  Constitutional: Negative for fever, activity change and fatigue.  HENT: Positive for nosebleeds, rhinorrhea and sneezing. Negative for congestion and mouth sores.   Eyes: Negative for pain and redness.   Respiratory: Negative for cough, chest tightness and shortness of breath.   Cardiovascular: Negative for chest pain and palpitations.  Gastrointestinal: Negative for nausea, vomiting, abdominal pain and diarrhea.  Genitourinary: Negative for dysuria, urgency, hematuria and flank pain.  Musculoskeletal: Negative for myalgias and back pain.  Skin: Negative for rash.  Neurological: Negative for dizziness, weakness and headaches.  Hematological: Bruises/bleeds easily.      Allergies  Iohexol; Z-pak; and Iodinated diagnostic agents  Home Medications   Prior to Admission medications   Medication Sig Start Date End Date Taking? Authorizing Provider  allopurinol (ZYLOPRIM) 100 MG tablet Take 100 mg by mouth daily.  05/05/15  Yes Mahima Bubba Camp, MD  aspirin EC 81 MG tablet Take 81 mg by mouth daily.   Yes Historical Provider, MD  cloNIDine (CATAPRES) 0.2 MG tablet Take one tablet by mouth three times daily for hypertension Patient taking differently: Take 0.2 mg by mouth 3 (three) times daily.  06/14/15  Yes [name] [name], DO  diltiazem (CARDIZEM) 60 MG tablet Take one tablet by mouth four times daily for hypertension 06/06/15  Yes [name] [name], DO  donepezil (ARICEPT) 5 MG tablet Take 1 tablet (5 mg total) by mouth at bedtime. Start one tablet daily x 4 weeks then two tablets daily 06/13/15  Yes Garvin Fila, MD  levothyroxine (SYNTHROID, LEVOTHROID) 50 MCG tablet Take one tablet by mouth 30 minutes before breakfast once daily for thyroid 05/05/15  Yes Mahima Pandey, MD  lisinopril (PRINIVIL,ZESTRIL) 20 MG tablet Take one tablet by mouth twice daily for hypertension Patient taking differently: Take 20 mg by mouth daily. Take one tablet by mouth twice daily for hypertension 06/06/15  Yes [name] [name], DO  Melatonin-Pyridoxine (MELATIN PO) Take 1 tablet by mouth at bedtime as needed. For sleep   Yes Historical Provider, MD  metoprolol (LOPRESSOR) 50 MG tablet Take 50 mg by mouth 2 (two)  times daily.   Yes Historical Provider, MD  pantoprazole sodium (PROTONIX) 40 mg/20 mL PACK Take 40 mg by mouth daily.    Yes Historical Provider, MD  Potassium Gluconate 2.5 MEQ TABS Take four capsules by mouth twice daily for potassium supplement   Yes Historical Provider, MD  Blood Glucose Calibration (ACCU-CHEK AVIVA) SOLN See admin instructions. 05/30/15   Historical Provider, MD  EASY COMFORT LANCETS MISC 3 (three) times daily. for testing 05/30/15   Historical Provider, MD   BP 174/82 mmHg  Pulse 83  Temp(Src) 98.4 F (36.9 C) (Oral)  Resp 18  Ht 5\' 7"  (1.702 m)  Wt 162 lb (73.483 kg)  BMI 25.37 kg/m2  SpO2 100% Physical Exam  Constitutional: She is oriented to person, place, and time. She appears well-developed and well-nourished. No distress.  HENT:  Head: Normocephalic and atraumatic.  Right Ear: External ear normal.  Left Ear: External ear normal.  Nose: Epistaxis is observed.  Mouth/Throat: Oropharynx is clear and moist. No oropharyngeal exudate, posterior oropharyngeal edema or posterior oropharyngeal erythema.  Left nostril with friable tissue along the septum.  No sign of active bleeding or site of bleeding in the right nostril.  Patient cleared large bloody clot from the right nostril during examination.  Dried blood over the posterior pharynx  Eyes: EOM are normal. Pupils are equal, round, and reactive to light.  Neck: Normal range of motion. Neck supple.  Cardiovascular: Normal rate and intact distal pulses.   Pulmonary/Chest: Effort normal. No respiratory distress. She has no wheezes. She has no rales.  Abdominal: Soft. She exhibits no distension. There is no tenderness.  Musculoskeletal: Normal range of motion. She exhibits no edema or tenderness.  Neurological: She is alert and oriented to person, place, and time.  Skin: Skin is warm and dry. No rash noted. She is not diaphoretic.  Vitals reviewed.   ED Course  Cauterization Date/Time: 06/19/2015 1:53  PM Performed by: Lonia Skinner ROE Authorized by: Harvel Quale Consent: Verbal consent obtained. Risks and benefits: risks, benefits and alternatives were discussed Consent given by: patient and spouse Patient understanding: patient states understanding of the procedure being performed Patient consent: the patient's understanding of the procedure matches consent given Required items: required blood products, implants, devices, and special equipment available Patient identity confirmed: verbally with patient and arm band Local anesthesia used: no Patient sedated: no Patient tolerance: Patient tolerated the procedure well with no immediate complications Comments: Small area of friable tissue along the left septum cauterized without complication   (including critical care time)   Labs Review Labs Reviewed  CBC - Abnormal; Notable for the following:    RBC 3.43 (*)    Hemoglobin 8.9 (*)    HCT 25.8 (*)    MCV 75.2 (*)    MCH 25.9 (*)    RDW 16.9 (*)    All other components within normal limits  PROTIME-INR - Abnormal; Notable for the following:    Prothrombin Time 15.9 (*)    All other components within normal limits    Imaging Review No results found. I have personally reviewed and evaluated these images and lab results as part of my medical decision-making.   EKG Interpretation None      MDM  Patient seen and evaluated in stable condition.  Mild bleeding on the nose on examination.  Resolved with Afrin and pressure.  Cauterization completed on the left nasal septum.  Offered and discussed nasal packing for the right but at this time patient and family preferred to watch the patient closely and to follow up with ENT and to return with re bleeding.  This was discussed at length with family.   Final diagnoses:  Epistaxis    1. Epistaxis    Harvel Quale, MD 06/19/15 1354

## 2015-06-19 NOTE — ED Notes (Signed)
Pt comes from home via EMS with c/o nosebleed. Started approx 1 hour ago. Pt is on blood thinners. Pt A&Ox4, NAD. Pt has had increased amount of nosebleeds and has not been evaluated. Pt given 3 pumps of Affrin in right nostril. Pt BP increased initially 190/103, 2nd BP 180/93.

## 2015-06-19 NOTE — ED Provider Notes (Signed)
CSN: [id number]     Arrival date & time 06/19/15  1340 History   First MD Initiated Contact with Patient 06/19/15 1354     Chief Complaint  Patient presents with  . Epistaxis     (Consider location/radiation/quality/duration/timing/severity/associated sxs/prior Treatment) HPI Comments: 81 y.o. Female with history of anemia, CHF, HTN, DM presents for recurrent nosebleed.  The patient was seen earlier today for the same and at that time packing was offered but as patient not bleeding patient and family declined for plan for watchful waiting.  While home the nose started bleeding again and they could not control/stop it.   Past Medical History  Diagnosis Date  . Colon cancer ([place]) 07/1997  . Hypertension   . Diabetes mellitus   . CHF (congestive heart failure) ([place])   . AAA (abdominal aortic aneurysm) ([place])   . Atrial fibrillation ([place])   . Hypercholesteremia   . Kidney stone     renal calculi  . Small bowel obstruction due to adhesions ([place]) 05/16/2012  . Hypothyroidism   . Arthritis   . Anemia   . Cholelithiasis   . Perforation of bile duct   . Hemorrhage of rectum and anus   . Swelling, mass, or lump in head and neck   . Cervicalgia   . Hypoglycemia, unspecified   . Pain in joint, lower leg   . Anxiety   . Shortness of breath   . Acute upper respiratory infections of unspecified site   . Depression   . Thoracic aneurysm without mention of rupture   . GERD (gastroesophageal reflux disease)   . Proteinuria   . Other specified cardiac dysrhythmias(427.89)   . Congestive heart failure, unspecified   . Electrolyte and fluid disorders not elsewhere classified   . Other chronic allergic conjunctivitis   . Hypopotassemia   . Chronic systolic heart failure ([place])   . Dizziness and giddiness   . Malignant neoplasm of colon, unspecified site   . Other and unspecified hyperlipidemia   . Gout, unspecified   . Atrial fibrillation ([place])   . Aortic aneurysm of unspecified site  without mention of rupture   . Pain in joint, site unspecified   . Palpitations   . CKD (chronic kidney disease)   . Diabetes mellitus [facility])    Past Surgical History  Procedure Laterality Date  . Hemicolectomy  08/11/1997  . Kidney stone surgery    . Colon surgery      to remove colon ca  . Cholecystectomy  05/14/2012    Procedure: LAPAROSCOPIC CHOLECYSTECTOMY WITH INTRAOPERATIVE CHOLANGIOGRAM;  Surgeon: Haywood Lasso, MD;  Location: [place];  Service: General;  Laterality: N/A;  . Ercp  05/17/2012    Procedure: ENDOSCOPIC RETROGRADE CHOLANGIOPANCREATOGRAPHY (ERCP);  Surgeon: Missy Sabins, MD;  Location: [place];  Service: Gastroenterology;  Laterality: N/A;  . Ercp  08/12/2012    Procedure: ENDOSCOPIC RETROGRADE CHOLANGIOPANCREATOGRAPHY (ERCP);  Surgeon: Missy Sabins, MD;  Location: [facility] ENDOSCOPY;  Service: Endoscopy;  Laterality: N/A;  . Transthoracic echocardiogram  11/2009    EF 45-50%, mild-mod AV regurg; mild MR; mod TR; ascending aorta mildly dilated  . Cardiac catheterization  2008    moderate severe pulm HTN; with elevted pulm capillary wedge pressure    Family History  Problem Relation Age of Onset  . Heart disease Mother   . Stroke Father   . Hypertension Daughter   . Hypertension Son   . Diabetes Sister   . Hypertension Son    Social  History  Substance Use Topics  . Smoking status: Never Smoker   . Smokeless tobacco: Never Used  . Alcohol Use: No   OB History    No data available     Review of Systems  Constitutional: Negative for fever, chills and fatigue.  HENT: Positive for nosebleeds. Negative for congestion and sinus pressure.   Respiratory: Negative for chest tightness and shortness of breath.   Cardiovascular: Negative for chest pain.  Gastrointestinal: Negative for nausea, abdominal pain and diarrhea.  Genitourinary: Negative for dysuria and hematuria.  Musculoskeletal: Negative for myalgias and back pain.  Neurological: Negative for dizziness,  weakness and light-headedness.  Hematological: Bruises/bleeds easily (on ASA).      Allergies  Iohexol; Z-pak; and Iodinated diagnostic agents  Home Medications   Prior to Admission medications   Medication Sig Start Date End Date Taking? Authorizing Provider  allopurinol (ZYLOPRIM) 100 MG tablet Take 100 mg by mouth daily.  05/05/15  Yes Mahima Bubba Camp, MD  aspirin EC 81 MG tablet Take 81 mg by mouth daily.   Yes Historical Provider, MD  cloNIDine (CATAPRES) 0.2 MG tablet Take one tablet by mouth three times daily for hypertension Patient taking differently: Take 0.2 mg by mouth 3 (three) times daily.  06/14/15  Yes [name] [name], DO  diltiazem (CARDIZEM) 60 MG tablet Take one tablet by mouth four times daily for hypertension 06/06/15  Yes [name] [name], DO  donepezil (ARICEPT) 5 MG tablet Take 1 tablet (5 mg total) by mouth at bedtime. Start one tablet daily x 4 weeks then two tablets daily 06/13/15  Yes Garvin Fila, MD  levothyroxine (SYNTHROID, LEVOTHROID) 50 MCG tablet Take one tablet by mouth 30 minutes before breakfast once daily for thyroid 05/05/15  Yes Mahima Pandey, MD  lisinopril (PRINIVIL,ZESTRIL) 20 MG tablet Take one tablet by mouth twice daily for hypertension Patient taking differently: Take 20 mg by mouth daily. Take one tablet by mouth twice daily for hypertension 06/06/15  Yes [name] [name], DO  Melatonin-Pyridoxine (MELATIN PO) Take 1 tablet by mouth at bedtime as needed. For sleep   Yes Historical Provider, MD  metoprolol (LOPRESSOR) 50 MG tablet Take 50 mg by mouth 2 (two) times daily.   Yes Historical Provider, MD  pantoprazole sodium (PROTONIX) 40 mg/20 mL PACK Take 40 mg by mouth daily.    Yes Historical Provider, MD  Potassium Gluconate 2.5 MEQ TABS Take four capsules by mouth twice daily for potassium supplement   Yes Historical Provider, MD  Blood Glucose Calibration (ACCU-CHEK AVIVA) SOLN See admin instructions. 05/30/15   Historical Provider, MD   cephALEXin (KEFLEX) 500 MG capsule Take 1 capsule (500 mg total) by mouth 4 (four) times daily. 06/19/15   Harvel Quale, MD  EASY COMFORT LANCETS MISC 3 (three) times daily. for testing 05/30/15   Historical Provider, MD   BP 169/103 mmHg  Pulse 71  Temp(Src) 98.7 F (37.1 C) (Oral)  Resp 16  SpO2 100% Physical Exam  Constitutional: She is oriented to person, place, and time. She appears well-developed and well-nourished. No distress.  HENT:  Head: Normocephalic and atraumatic.  Right Ear: External ear normal.  Left Ear: External ear normal.  Nose: Epistaxis is observed.  Mouth/Throat: Oropharynx is clear and moist. No oropharyngeal exudate.  Dried blood over the posterior pharynx  Eyes: EOM are normal. Pupils are equal, round, and reactive to light.  Neck: Normal range of motion. Neck supple.  Cardiovascular: Normal rate, regular rhythm, normal heart sounds  and intact distal pulses.   No murmur heard. Pulmonary/Chest: Effort normal. No respiratory distress. She has no wheezes. She has no rales.  Abdominal: Soft. She exhibits no distension. There is no tenderness.  Musculoskeletal: Normal range of motion. She exhibits no edema or tenderness.  Neurological: She is alert and oriented to person, place, and time.  Skin: Skin is warm and dry. No rash noted. She is not diaphoretic.  Vitals reviewed.   ED Course  Procedures (including critical care time) Labs Review Labs Reviewed - No data to display  Imaging Review No results found. I have personally reviewed and evaluated these images and lab results as part of my medical decision-making.   EKG Interpretation None      MDM  Patient seen and evaluated in stable condition.  Recurrent epistaxis.  Rhino rocket placed in the right nostril but the patient had bleeding return.  Discussed with Dr. Benjamine Mola who was in the department and who came and evaluated the patient bedside and repacked the patient's nose.  He arranged for her to  follow up with him in the office on Thursday.  She was discharged home in stable condition with instruction to keep follow up appointment and with strict return precautions. Final diagnoses:  Epistaxis    1. Epistaxis    Harvel Quale, MD 06/21/15 [phone]

## 2015-06-19 NOTE — ED Notes (Signed)
EDP at bedside  

## 2015-06-19 NOTE — Discharge Instructions (Signed)
You were seen today for your nose bleeds.  If this happens again apply pressure for 10 minutes.  If you cannot control/stop the bleeding return immediately as you may need to have a packing placed in your nose.  You need to follow up with the ear, nose, throat doctor whose information is provided.  You also need either the ENT or your primary care doctor to recheck your blood counts this week.  Nosebleed Nosebleeds are common. They are due to a crack in the inside lining of your nose (mucous membrane) or from a small blood vessel that starts to bleed. Nosebleeds can be caused by many conditions, such as injury, infections, dry mucous membranes or dry climate, medicines, nose picking, and home heating and cooling systems. Most nosebleeds come from blood vessels in the front of your nose. HOME CARE INSTRUCTIONS   Try controlling your nosebleed by pinching your nostrils gently and continuously for at least 10 minutes.  Avoid blowing or sniffing your nose for a number of hours after having a nosebleed.  Do not put gauze inside your nose yourself. If your nose was packed by your health care provider, try to maintain the pack inside of your nose until your health care provider removes it.  If a gauze pack was used and it starts to fall out, gently replace it or cut off the end of it.  If a balloon catheter was used to pack your nose, do not cut or remove it unless your health care provider has instructed you to do that.  Avoid lying down while you are having a nosebleed. Sit up and lean forward.  Use a nasal spray decongestant to help with a nosebleed as directed by your health care provider.  Do not use petroleum jelly or mineral oil in your nose. These can drip into your lungs.  Maintain humidity in your home by using less air conditioning or by using a humidifier.  Aspirinand blood thinners make bleeding more likely. If you are prescribed these medicines and you suffer from nosebleeds, ask your  health care provider if you should stop taking the medicines or adjust the dose. Do not stop medicines unless directed by your health care provider  Resume your normal activities as you are able, but avoid straining, lifting, or bending at the waist for several days.  If your nosebleed was caused by dry mucous membranes, use over-the-counter saline nasal spray or gel. This will keep the mucous membranes moist and allow them to heal. If you must use a lubricant, choose the water-soluble variety. Use it only sparingly, and do not use it within several hours of lying down.  Keep all follow-up visits as directed by your health care provider. This is important. SEEK MEDICAL CARE IF:  You have a fever.  You get frequent nosebleeds.  You are getting nosebleeds more often. SEEK IMMEDIATE MEDICAL CARE IF:  Your nosebleed lasts longer than 20 minutes.  Your nosebleed occurs after an injury to your face, and your nose looks crooked or broken.  You have unusual bleeding from other parts of your body.  You have unusual bruising on other parts of your body.  You feel light-headed or you faint.  You become sweaty.  You vomit blood.  Your nosebleed occurs after a head injury.   This information is not intended to replace advice given to you by your health care provider. Make sure you discuss any questions you have with your health care provider.   Document Released:  04/18/2005 Document Revised: 07/30/2014 Document Reviewed: 02/22/2014 Elsevier Interactive Patient Education [employer].

## 2015-06-19 NOTE — ED Notes (Signed)
Nose bleed stopped. Pt given instructions to follow-up. VSS. Pt discharge with family home.

## 2015-06-19 NOTE — ED Notes (Signed)
Pt stable, ambulatory, states understanding of discharge instructions 

## 2015-06-19 NOTE — Consult Note (Signed)
Reason for Consult: Recurrent epistaxis  HPI:  Nicole [name] is an 81 y.o. female who presents to the [facility] ER today c/o recurrent epistaxis. The patient has a history of atrial fibrillation, deemed not an anticoagulation candidate, HTN, and CHF. The patient has had recurrent intermittent nosebleeds over the last month. They usually occur in the morning and then resolve on their own. This morning she had two episodes and the second one was severe.  they brought her to the ER for evaluation. Her right [place] was packed with rhinorocket. However, she continues to have breakthrough bleeding.   Past Medical History  Diagnosis Date  . Colon cancer ([place]) 07/1997  . Hypertension   . Diabetes mellitus   . CHF (congestive heart failure) ([place])   . AAA (abdominal aortic aneurysm) ([place])   . Atrial fibrillation ([place])   . Hypercholesteremia   . Kidney stone     renal calculi  . Small bowel obstruction due to adhesions ([place]) 05/16/2012  . Hypothyroidism   . Arthritis   . Anemia   . Cholelithiasis   . Perforation of bile duct   . Hemorrhage of rectum and anus   . Swelling, mass, or lump in head and neck   . Cervicalgia   . Hypoglycemia, unspecified   . Pain in joint, lower leg   . Anxiety   . Shortness of breath   . Acute upper respiratory infections of unspecified site   . Depression   . Thoracic aneurysm without mention of rupture   . GERD (gastroesophageal reflux disease)   . Proteinuria   . Other specified cardiac dysrhythmias(427.89)   . Congestive heart failure, unspecified   . Electrolyte and fluid disorders not elsewhere classified   . Other chronic allergic conjunctivitis   . Hypopotassemia   . Chronic systolic heart failure ([place])   . Dizziness and giddiness   . Malignant neoplasm of colon, unspecified site   . Other and unspecified hyperlipidemia   . Gout, unspecified   . Atrial fibrillation ([place])   . Aortic aneurysm of unspecified site without mention of rupture   . Pain in  joint, site unspecified   . Palpitations   . CKD (chronic kidney disease)   . Diabetes mellitus [facility])     Past Surgical History  Procedure Laterality Date  . Hemicolectomy  08/11/1997  . Kidney stone surgery    . Colon surgery      to remove colon ca  . Cholecystectomy  05/14/2012    Procedure: LAPAROSCOPIC CHOLECYSTECTOMY WITH INTRAOPERATIVE CHOLANGIOGRAM;  Surgeon: Haywood Lasso, MD;  Location: [place];  Service: General;  Laterality: N/A;  . Ercp  05/17/2012    Procedure: ENDOSCOPIC RETROGRADE CHOLANGIOPANCREATOGRAPHY (ERCP);  Surgeon: Missy Sabins, MD;  Location: [place];  Service: Gastroenterology;  Laterality: N/A;  . Ercp  08/12/2012    Procedure: ENDOSCOPIC RETROGRADE CHOLANGIOPANCREATOGRAPHY (ERCP);  Surgeon: Missy Sabins, MD;  Location: [facility] ENDOSCOPY;  Service: Endoscopy;  Laterality: N/A;  . Transthoracic echocardiogram  11/2009    EF 45-50%, mild-mod AV regurg; mild MR; mod TR; ascending aorta mildly dilated  . Cardiac catheterization  2008    moderate severe pulm HTN; with elevted pulm capillary wedge pressure     Family History  Problem Relation Age of Onset  . Heart disease Mother   . Stroke Father   . Hypertension Daughter   . Hypertension Son   . Diabetes Sister   . Hypertension Son     Social History:  reports  that she has never smoked. She has never used smokeless tobacco. She reports that she does not drink alcohol or use illicit drugs.  Allergies:  Allergies  Allergen Reactions  . Iohexol      Code: VOM, Desc: PT REPORTS VOMITING W/ IVP DYE- ARS 12/26/08, Onset Date: [id number]   . Z-Pak [Azithromycin]   . Iodinated Diagnostic Agents Nausea Only    PT HAS HAD SINGULAR INCIDENT OF NAUSEA  W/ IV CONTYRAST INJECTION, NONE IF INJECTED SLOWLY//A.[place]    Prior to Admission medications   Medication Sig Start Date End Date Taking? Authorizing Provider  allopurinol (ZYLOPRIM) 100 MG tablet Take 100 mg by mouth daily.  05/05/15  Yes Mahima Bubba Camp, MD  aspirin  EC 81 MG tablet Take 81 mg by mouth daily.   Yes Historical Provider, MD  cloNIDine (CATAPRES) 0.2 MG tablet Take one tablet by mouth three times daily for hypertension Patient taking differently: Take 0.2 mg by mouth 3 (three) times daily.  06/14/15  Yes [name] [name], DO  diltiazem (CARDIZEM) 60 MG tablet Take one tablet by mouth four times daily for hypertension 06/06/15  Yes [name] [name], DO  donepezil (ARICEPT) 5 MG tablet Take 1 tablet (5 mg total) by mouth at bedtime. Start one tablet daily x 4 weeks then two tablets daily 06/13/15  Yes Garvin Fila, MD  levothyroxine (SYNTHROID, LEVOTHROID) 50 MCG tablet Take one tablet by mouth 30 minutes before breakfast once daily for thyroid 05/05/15  Yes Mahima Pandey, MD  lisinopril (PRINIVIL,ZESTRIL) 20 MG tablet Take one tablet by mouth twice daily for hypertension Patient taking differently: Take 20 mg by mouth daily. Take one tablet by mouth twice daily for hypertension 06/06/15  Yes [name] [name], DO  Melatonin-Pyridoxine (MELATIN PO) Take 1 tablet by mouth at bedtime as needed. For sleep   Yes Historical Provider, MD  metoprolol (LOPRESSOR) 50 MG tablet Take 50 mg by mouth 2 (two) times daily.   Yes Historical Provider, MD  pantoprazole sodium (PROTONIX) 40 mg/20 mL PACK Take 40 mg by mouth daily.    Yes Historical Provider, MD  Potassium Gluconate 2.5 MEQ TABS Take four capsules by mouth twice daily for potassium supplement   Yes Historical Provider, MD  Blood Glucose Calibration (ACCU-CHEK AVIVA) SOLN See admin instructions. 05/30/15   Historical Provider, MD  EASY COMFORT LANCETS MISC 3 (three) times daily. for testing 05/30/15   Historical Provider, MD    Results for orders placed or performed during the hospital encounter of 06/19/15 (from the past 48 hour(s))  CBC     Status: Abnormal   Collection Time: 06/19/15 10:24 AM  Result Value Ref Range   WBC 7.2 4.0 - 10.5 K/uL   RBC 3.43 (L) 3.87 - 5.11 MIL/uL   Hemoglobin 8.9 (L) 12.0 -  15.0 g/dL   HCT 25.8 (L) 36.0 - 46.0 %   MCV 75.2 (L) 78.0 - 100.0 fL   MCH 25.9 (L) 26.0 - 34.0 pg   MCHC 34.5 30.0 - 36.0 g/dL   RDW 16.9 (H) 11.5 - 15.5 %   Platelets 277 150 - 400 K/uL  Protime-INR     Status: Abnormal   Collection Time: 06/19/15 10:24 AM  Result Value Ref Range   Prothrombin Time 15.9 (H) 11.6 - 15.2 seconds   INR 1.26 0.00 - 1.49    No results found.     Review of Systems  Constitutional: Negative for fever, activity change and fatigue.  HENT: Positive for nosebleeds,  rhinorrhea and sneezing. Negative for congestion and mouth sores.  Eyes: Negative for pain and redness.  Respiratory: Negative for cough, chest tightness and shortness of breath.  Cardiovascular: Negative for chest pain and palpitations.  Gastrointestinal: Negative for nausea, vomiting, abdominal pain and diarrhea.  Genitourinary: Negative for dysuria, urgency, hematuria and flank pain.  Musculoskeletal: Negative for myalgias and back pain.  Skin: Negative for rash.  Neurological: Negative for dizziness, weakness and headaches.  Hematological: Bruises/bleeds easily.      Blood pressure 164/101, pulse 87, temperature 98.7 F (37.1 C), temperature source Oral, resp. rate 16, SpO2 100 %. Physical Exam  Constitutional: She is oriented to person, place, and time. She appears well-developed and well-nourished. No distress.  Head: Normocephalic and atraumatic.  Right Ear: External ear normal.  Left Ear: External ear normal.  Mouth/Throat: Oropharynx is clear and moist.  Nose: Right sided epistaxis is noted.  Eyes: EOM are normal. Pupils are equal, round, and reactive to light.  Neck: Normal range of motion. Neck supple.  Cardiovascular: Normal rate and intact distal pulses.  Pulmonary/Chest: Effort normal. No respiratory distress. She has no wheezes. She has no rales.  Musculoskeletal: Normal range of motion. She exhibits no edema or tenderness.  Neurological: She is alert and oriented  to person, place, and time.  Skin: Skin is warm and dry. No rash noted. She is not diaphoretic.   Procedure: Anterior/Posterior nasal packing for control of right epistaxis. Anesthesia: Topical xylocaine and Afrin Description: The patient is placed upright in her hospital bed.  Blood clot is suctions from the right nasal cavity. Bleeding is noted from anterior and posterior right nasal cavity.Topical xylocaine and Afrin are applied. A 10 cm Merocel packing is placed in the right nasal cavity with good hemostasis.  The patient tolerated the procedure well.  Assessment/Plan: Recurrent right epistaxis.  The bleeding is controlled with Merocel packing. The patient may be discharged home on gram-positive antibiotic coverage. She will follow-up my office in approximately 3 days for packing removal.  Levi Klaiber,SUI W 06/19/2015, 5:05 PM

## 2015-06-19 NOTE — Discharge Instructions (Signed)

## 2015-06-19 NOTE — ED Notes (Signed)
Pt was just seen in the ER over an hour ago. Pt's nose began to bleed profusely again out of the right nare. Pt accompany by family. Pressure applied.

## 2015-06-21 ENCOUNTER — Telehealth: Payer: Self-pay

## 2015-06-21 ENCOUNTER — Encounter: Payer: Self-pay | Admitting: Internal Medicine

## 2015-06-21 NOTE — Telephone Encounter (Signed)
Received form from [employer] requesting information on patient for diabetic testing supplies. Spoke with Jiles Prows, he will ask his brother [name] and how offend does she check her blood sugar and call back.

## 2015-06-22 DIAGNOSIS — R040 Epistaxis: Secondary | ICD-10-CM | POA: Diagnosis not present

## 2015-06-23 ENCOUNTER — Telehealth: Payer: Self-pay | Admitting: *Deleted

## 2015-06-23 NOTE — Telephone Encounter (Signed)
-----   Message from Garvin Fila, MD sent at 06/20/2015  5:37 PM EST ----- Kindly inform the patient that vitamin B12, thyroid hormone and syphilis tests all normal. Homocystine is slightly elevated of finding of unclear significance.

## 2015-06-23 NOTE — Telephone Encounter (Signed)
Spoke with patient and informed her of Dr Clydene Fake specific lab result note. Confirmed her Feb 2017 FU with Dr Leonie Man. She verbalized understanding, appreciation for call.

## 2015-06-24 ENCOUNTER — Ambulatory Visit
Admission: RE | Admit: 2015-06-24 | Discharge: 2015-06-24 | Disposition: A | Discharge status: Home / Self Care | Payer: Medicare Other | Source: Ambulatory Visit | Attending: Neurology | Admitting: Neurology

## 2015-06-24 DIAGNOSIS — F0151 Vascular dementia with behavioral disturbance: Secondary | ICD-10-CM | POA: Diagnosis not present

## 2015-06-29 ENCOUNTER — Telehealth: Payer: Self-pay

## 2015-06-29 ENCOUNTER — Other Ambulatory Visit: Payer: Medicare Other

## 2015-06-29 DIAGNOSIS — K5900 Constipation, unspecified: Secondary | ICD-10-CM | POA: Diagnosis not present

## 2015-06-29 DIAGNOSIS — Z780 Asymptomatic menopausal state: Secondary | ICD-10-CM

## 2015-06-29 DIAGNOSIS — I4891 Unspecified atrial fibrillation: Secondary | ICD-10-CM

## 2015-06-29 DIAGNOSIS — E034 Atrophy of thyroid (acquired): Secondary | ICD-10-CM | POA: Diagnosis not present

## 2015-06-29 DIAGNOSIS — E038 Other specified hypothyroidism: Secondary | ICD-10-CM | POA: Diagnosis not present

## 2015-06-29 DIAGNOSIS — K5909 Other constipation: Secondary | ICD-10-CM

## 2015-06-29 DIAGNOSIS — E1121 Type 2 diabetes mellitus with diabetic nephropathy: Secondary | ICD-10-CM

## 2015-06-29 NOTE — Telephone Encounter (Signed)
Rn call patient to notify her that the follow-up CT scan of the head showed expected resolution of the previous brain hemorrhage from September 2016 and age related findings of mild shrinkage of the brain and hardening of the arteries. No new, worrisome or unexpected findings.Pt verbalized understanding of the Ct scan results.

## 2015-06-29 NOTE — Telephone Encounter (Signed)
Rn talk to son [name] about his moms Ct scan. [name] is on her DPR list. Rn explain to pts son that the Ct scan showed expected resolution of previous brain hemorrhage from 03/2015, and age related findings of mild shrinkage of brain, and hardening of the arteries. Rn explain per Dr.Sethi no new findings. PT son [name] verbalized understanding.

## 2015-06-29 NOTE — Telephone Encounter (Signed)
Patient's son is calling regarding the conversation you just had with his mother. [name] can be reached at [phone]. Thank you.

## 2015-06-29 NOTE — Telephone Encounter (Signed)
-----   Message from Garvin Fila, MD sent at 06/29/2015  8:30 AM EST ----- Kindly inform the patient that follow-up CT scan of the head showed expected resolution of the previous brain hemorrhage from September 2016 and age related findings of mild shrinkage of the brain and hardening of the arteries. No new, worrisome or unexpected findings.

## 2015-06-30 LAB — CBC WITH DIFFERENTIAL/PLATELET
Basophils Absolute: 0 10*3/uL (ref 0.0–0.2)
Basos: 0 %
EOS (ABSOLUTE): 0.1 10*3/uL (ref 0.0–0.4)
Eos: 1 %
Hematocrit: 25.4 % — ABNORMAL LOW (ref 34.0–46.6)
Hemoglobin: 8.1 g/dL — ABNORMAL LOW (ref 11.1–15.9)
Immature Grans (Abs): 0.1 10*3/uL (ref 0.0–0.1)
Immature Granulocytes: 1 %
Lymphocytes Absolute: 1.6 10*3/uL (ref 0.7–3.1)
Lymphs: 18 %
MCH: 25.2 pg — ABNORMAL LOW (ref 26.6–33.0)
MCHC: 31.9 g/dL (ref 31.5–35.7)
MCV: 79 fL (ref 79–97)
Monocytes Absolute: 1.2 10*3/uL — ABNORMAL HIGH (ref 0.1–0.9)
Monocytes: 14 %
NRBC: 7 % — ABNORMAL HIGH (ref 0–0)
Neutrophils Absolute: 5.8 10*3/uL (ref 1.4–7.0)
Neutrophils: 66 %
Platelets: 441 10*3/uL — ABNORMAL HIGH (ref 150–379)
RBC: 3.21 x10E6/uL — ABNORMAL LOW (ref 3.77–5.28)
RDW: 18 % — ABNORMAL HIGH (ref 12.3–15.4)
WBC: 8.8 10*3/uL (ref 3.4–10.8)

## 2015-06-30 LAB — HEMOGLOBIN A1C
Est. average glucose Bld gHb Est-mCnc: 171 mg/dL
Hgb A1c MFr Bld: 7.6 % — ABNORMAL HIGH (ref 4.8–5.6)

## 2015-07-01 ENCOUNTER — Ambulatory Visit (INDEPENDENT_AMBULATORY_CARE_PROVIDER_SITE_OTHER): Payer: Medicare Other | Admitting: Internal Medicine

## 2015-07-01 ENCOUNTER — Encounter: Payer: Self-pay | Admitting: Internal Medicine

## 2015-07-01 VITALS — BP 132/84 | HR 78 | Temp 97.5°F | Ht 67.0 in | Wt 165.0 lb

## 2015-07-01 DIAGNOSIS — E876 Hypokalemia: Secondary | ICD-10-CM | POA: Diagnosis not present

## 2015-07-01 DIAGNOSIS — E1121 Type 2 diabetes mellitus with diabetic nephropathy: Secondary | ICD-10-CM

## 2015-07-01 DIAGNOSIS — F0150 Vascular dementia without behavioral disturbance: Secondary | ICD-10-CM

## 2015-07-01 DIAGNOSIS — R600 Localized edema: Secondary | ICD-10-CM

## 2015-07-01 DIAGNOSIS — I482 Chronic atrial fibrillation, unspecified: Secondary | ICD-10-CM

## 2015-07-01 DIAGNOSIS — R040 Epistaxis: Secondary | ICD-10-CM

## 2015-07-01 DIAGNOSIS — I610 Nontraumatic intracerebral hemorrhage in hemisphere, subcortical: Secondary | ICD-10-CM

## 2015-07-01 MED ORDER — ZOSTER VACCINE LIVE 19400 UNT/0.65ML ~~LOC~~ SOLR: 0.6500 mL | Freq: Once | SUBCUTANEOUS | Status: DC

## 2015-07-01 NOTE — Progress Notes (Signed)
Patient ID: Nicole [name], female   DOB: July 25, 1930, 81 y.o.   MRN: [id number]   Location: [facility] Provider: Rexene Edison. Mariea Clonts, D.O., C.M.D.  Code Status: DNR Goals of Care: Advanced Directive information Does patient have an advance directive?: Yes, Type of Advance Directive: Living will, Does patient want to make changes to advanced directive?: No - Patient declined  Chief Complaint  Patient presents with  . Follow-up    ER follow-up, ? if urine sample still needed(was unable to collect at lab visit), discuss if patient should still be on potassium. FYI- patient is not checking bloodsugar, ? if she should resume. Here with God-daughter [name] and caregiver [name]. Patient may need FL2 in the near future or home health  . Immunizations    Shingles Vaccine RX printed and given to patient     HPI: Patient is a 81 y.o. female seen in the office today for ER follow up from 06/19/15 and possible FL-2 (we will complete in case).    11/27, she went to hospital with nosebleed.  She has not been on blood thinners since her intracranial hemorrhage (only baby asa).  She was treated with afrin in her right nostril.  bp was initially very high at 190/103, then down to 180/93.  The nose bleeds have been recurring ofver the past month, but were resolving on their own until that am.  Family and caregiver noted more difficulty with right nostril, but pt said left nostril--dried blood in pharynx and friable tissue left nostril. Dr. Benjamine Mola saw her and  Performed suctioning of right nasal cavity and topical xylocaine and afrin were applied and 10cm merocel packing placed with good hemostasis.  Apparently left was cauterized.  F/u with ENT was arranged for 3 days later for packing removal.  No further problems since.    Since last visit, f/u CT head was performed which showed resolution of the previous brain hemorrhage and mild shrinkage of brain with hardening of the arteries but no new concerns.  Dr.  Leonie Man reviewed and pt's family was notified    Needs some cues and a little help with bathing and dressing.  CNA must encourage her to eat.  She can wash on her own.  Needs help with meds.  MMSE down to 16 from 29/30 2 years ago. Failed clock.  Making it to restroom to urinate.  Had been constipated and started on miralax which helped.  No fecal incontinence.  She feels she is sleeping well--she's been asleep with the CNA arrives in the am.  Using 10mg .    Also got humidifier.  Really helps her dry nose and mouth.    Weight up three lbs since last time 11/27.  She has mild edema of ankles--reviewed watching sodium.  Also had been on water pill and potassium before and now on only potassium.  Review of Systems:  Review of Systems  Constitutional: Negative for fever, chills and weight loss.       Gained a little weight  HENT: Negative for congestion and hearing loss.        Epistaxis resolved  Eyes: Negative for blurred vision.  Respiratory: Negative for shortness of breath.   Cardiovascular: Negative for chest pain.  Gastrointestinal: Negative for abdominal pain, constipation, blood in stool and melena.  Genitourinary: Negative for dysuria, urgency and frequency.  Musculoskeletal: Negative for myalgias and falls.  Skin: Negative for rash.  Neurological: Negative for dizziness, loss of consciousness, weakness and headaches.  Endo/Heme/Allergies: Does not  bruise/bleed easily.  Psychiatric/Behavioral: Positive for hallucinations and memory loss. Negative for depression. The patient does not have insomnia.        Sleeping better    Past Medical History  Diagnosis Date  . Colon cancer ([place]) 07/1997  . Hypertension   . Diabetes mellitus   . CHF (congestive heart failure) ([place])   . AAA (abdominal aortic aneurysm) ([place])   . Atrial fibrillation ([place])   . Hypercholesteremia   . Kidney stone     renal calculi  . Small bowel obstruction due to adhesions ([place]) 05/16/2012  .  Hypothyroidism   . Arthritis   . Anemia   . Cholelithiasis   . Perforation of bile duct   . Hemorrhage of rectum and anus   . Swelling, mass, or lump in head and neck   . Cervicalgia   . Hypoglycemia, unspecified   . Pain in joint, lower leg   . Anxiety   . Shortness of breath   . Acute upper respiratory infections of unspecified site   . Depression   . Thoracic aneurysm without mention of rupture   . GERD (gastroesophageal reflux disease)   . Proteinuria   . Other specified cardiac dysrhythmias(427.89)   . Congestive heart failure, unspecified   . Electrolyte and fluid disorders not elsewhere classified   . Other chronic allergic conjunctivitis   . Hypopotassemia   . Chronic systolic heart failure ([place])   . Dizziness and giddiness   . Malignant neoplasm of colon, unspecified site   . Other and unspecified hyperlipidemia   . Gout, unspecified   . Atrial fibrillation ([place])   . Aortic aneurysm of unspecified site without mention of rupture   . Pain in joint, site unspecified   . Palpitations   . CKD (chronic kidney disease)   . Diabetes mellitus [facility])     Past Surgical History  Procedure Laterality Date  . Hemicolectomy  08/11/1997  . Kidney stone surgery    . Colon surgery      to remove colon ca  . Cholecystectomy  05/14/2012    Procedure: LAPAROSCOPIC CHOLECYSTECTOMY WITH INTRAOPERATIVE CHOLANGIOGRAM;  Surgeon: Haywood Lasso, MD;  Location: [facility];  Service: General;  Laterality: N/A;  . Ercp  05/17/2012    Procedure: ENDOSCOPIC RETROGRADE CHOLANGIOPANCREATOGRAPHY (ERCP);  Surgeon: Missy Sabins, MD;  Location: [facility];  Service: Gastroenterology;  Laterality: N/A;  . Ercp  08/12/2012    Procedure: ENDOSCOPIC RETROGRADE CHOLANGIOPANCREATOGRAPHY (ERCP);  Surgeon: Missy Sabins, MD;  Location: [facility] ENDOSCOPY;  Service: Endoscopy;  Laterality: N/A;  . Transthoracic echocardiogram  11/2009    EF 45-50%, mild-mod AV regurg; mild MR; mod TR; ascending aorta mildly dilated  .  Cardiac catheterization  2008    moderate severe pulm HTN; with elevted pulm capillary wedge pressure     Allergies  Allergen Reactions  . Iohexol      Code: VOM, Desc: PT REPORTS VOMITING W/ IVP DYE- ARS 12/26/08, Onset Date: [id number]   . Z-Pak [Azithromycin]   . Iodinated Diagnostic Agents Nausea Only    PT HAS HAD SINGULAR INCIDENT OF NAUSEA  W/ IV CONTYRAST INJECTION, NONE IF INJECTED SLOWLY//A.CALHOUN      Medication List       This list is accurate as of: 07/01/15 10:14 AM.  Always use your most recent med list.               ACCU-CHEK AVIVA Soln  See admin instructions.     allopurinol 100 MG  tablet  Commonly known as:  ZYLOPRIM  Take 100 mg by mouth daily.     aspirin EC 81 MG tablet  Take 81 mg by mouth daily.     cloNIDine 0.2 MG tablet  Commonly known as:  CATAPRES  Take one tablet by mouth three times daily for hypertension     diltiazem 60 MG tablet  Commonly known as:  CARDIZEM  Take one tablet by mouth four times daily for hypertension     donepezil 5 MG tablet  Commonly known as:  ARICEPT  Take 1 tablet (5 mg total) by mouth at bedtime. Start one tablet daily x 4 weeks then two tablets daily     EASY COMFORT LANCETS Misc  3 (three) times daily. for testing     levothyroxine 50 MCG tablet  Commonly known as:  SYNTHROID, LEVOTHROID  Take one tablet by mouth 30 minutes before breakfast away from other medications for thyroid     lisinopril 20 MG tablet  Commonly known as:  PRINIVIL,ZESTRIL  Take one tablet by mouth twice daily for hypertension     MELATIN PO  Take 1 tablet by mouth at bedtime as needed. For sleep     metoprolol 50 MG tablet  Commonly known as:  LOPRESSOR  Take 50 mg by mouth 2 (two) times daily.     Potassium Gluconate 2.5 MEQ Tabs  Take four capsules by mouth twice daily for potassium supplement     PROTONIX 40 mg/20 mL Pack  Generic drug:  pantoprazole sodium  Take 40 mg by mouth daily.     zoster vaccine live (PF)  19400 UNT/0.65ML injection  Commonly known as:  ZOSTAVAX  Inject 19,400 Units into the skin once.        Health Maintenance  Topic Date Due  . OPHTHALMOLOGY EXAM  03/22/1943  . URINE MICROALBUMIN  03/22/1943  . ZOSTAVAX  03/21/1993  . HEMOGLOBIN A1C  12/28/2015  . INFLUENZA VACCINE  02/21/2016  . FOOT EXAM  02/24/2016  . TETANUS/TDAP  09/16/2021  . DEXA SCAN  Completed  . PNA vac Low Risk Adult  Completed    Physical Exam: Filed Vitals:   07/01/15 0940  BP: 132/84  Pulse: 78  Temp: 97.5 F (36.4 C)  TempSrc: Oral  Height: 5\' 7"  (1.702 m)  Weight: 165 lb (74.844 kg)  SpO2: 98%   Body mass index is 25.84 kg/(m^2). Physical Exam  Constitutional: She appears well-developed and well-nourished. No distress.  HENT:  Head: Normocephalic and atraumatic.  Cardiovascular: Intact distal pulses.   irreg irreg  Pulmonary/Chest: Effort normal and breath sounds normal. No respiratory distress.  Abdominal: Soft. Bowel sounds are normal. She exhibits no distension. There is no tenderness.  Musculoskeletal: Normal range of motion. She exhibits no edema or tenderness.  Neurological: She is alert. No cranial nerve deficit.  Oriented to person, place, not time; walking w/o walker today  Skin: Skin is warm and dry.  Psychiatric: She has a normal mood and affect.    Labs reviewed: Basic Metabolic Panel:  Recent Labs  02/17/15 0810  04/05/15 0819  04/23/15 0720 04/27/15 0936 05/02/15 0624 06/13/15 1524  NA 145*  < > 144  < > 134* 135 135  --   K 3.7  < > 3.3*  < > 4.2 4.3 4.3  --   CL 100  < > 110  < > 103 101 101  --   CO2 26  < > 26  < > 24 26  25  --   GLUCOSE 54*  < > 139*  < > 161* 240* 150*  --   BUN 12  < > 10  < > 15 8 9   --   CREATININE 1.23*  < > 1.10*  < > 1.14* 1.13* 1.23*  --   CALCIUM 9.0  < > 8.9  < > 8.9 9.1 8.9  --   TSH 2.200  --  2.053  --   --   --   --  2.210  < > = values in this interval not displayed. Liver Function Tests:  Recent Labs   10/04/14 0922 04/04/15 0945 04/13/15 0546  AST 23 22 13*  ALT 13 10* 8*  ALKPHOS 107 91 71  BILITOT 0.9 1.0 0.8  PROT 7.7 7.8 6.4*  ALBUMIN 3.7 3.6 2.5*   No results for input(s): LIPASE, AMYLASE in the last 8760 hours. No results for input(s): AMMONIA in the last 8760 hours. CBC:  Recent Labs  04/04/15 0945  04/12/15 2115 04/13/15 0546 06/19/15 1024 06/29/15 0938  WBC 7.1  < > 10.8* 8.7 7.2 8.8  NEUTROABS 6.1  --   --  6.7  --  5.8  HGB 12.8  < > 12.2 11.7* 8.9*  --   HCT 37.4  < > 34.9* 34.2* 25.8* 25.4*  MCV 77.1*  < > 76.9* 76.5* 75.2*  --   PLT 329  < > 259 297 277  --   < > = values in this interval not displayed. Lipid Panel:  Recent Labs  02/17/15 0810 04/05/15 0819  CHOL 176 161  HDL 70 43  LDLCALC 92 97  TRIG 69 104  CHOLHDL 2.5 3.7   Lab Results  Component Value Date   HGBA1C 7.6* 06/29/2015    Procedures since last visit: 06/24/15:  CT head w/o contrast:  This is an abnormal CT scan of the head without contrast showing moderate cortical atrophy and periventricular and deep white matter hypoattenuation consistent with chronic microvascular ischemic change. There has been resolution of the hemorrhagic stroke involving the right basal ganglia. There are no acute findings on the current study.  Assessment/Plan 1. Nontraumatic subcortical hemorrhage of right cerebral hemisphere [facility]) - is now off anticoagulation except baby asa -blood in the brain is resolved - unfortunately has developed dementia since this event -is requiring some adl assist as in hpi and family is considering placement for her if they are unable to safely provide 24 hr supervision  2. Hypokalemia - has been on potassium, but used to be on diuretics, recheck K - Basic metabolic panel  3. Epistaxis -has resolved after she had it packed by ENT and she followed up there  4. Subcortical vascular dementia, without behavioral disturbance -continues to require adl and iadl  assistance -needs some cues and assistance with adls--some assisted livings may be able to handle these needs, but not all  5. Chronic atrial fibrillation (HCC) -cont baby asa, diltiazem, lopressor  6. Localized edema -advised to avoid high sodium foods, elevate feet at rest  7.  DMII with diabetic nephropathy w/o insulin -needs urine microalbumin but has been unable to do sample at office--family to collect and bring back in -continues to check glucose three times daily -daily checks would certainly be adequate -cont lisinopril and baby asa  Labs/tests ordered:   Orders Placed This Encounter  Procedures  . Basic metabolic panel   Next appt:  08/15/2015   Jayen Bromwell L. Seaborn Nakama, D.O. [place]  [place], [place] [address] Cell Phone (Mon-Fri 8am-5pm):  [phone] On Call:  [phone] & follow prompts after 5pm & weekends Office Phone:  [phone] Office Fax:  [phone]

## 2015-07-02 LAB — MICROALBUMIN / CREATININE URINE RATIO
Creatinine, Urine: 74 mg/dL
MICROALB/CREAT RATIO: 345.1 mg/g creat — ABNORMAL HIGH (ref 0.0–30.0)
Microalbumin, Urine: 255.4 ug/mL

## 2015-07-02 LAB — BASIC METABOLIC PANEL
BUN/Creatinine Ratio: 13 (ref 11–26)
BUN: 15 mg/dL (ref 8–27)
CO2: 19 mmol/L (ref 18–29)
Calcium: 9.3 mg/dL (ref 8.7–10.3)
Chloride: 104 mmol/L (ref 97–106)
Creatinine, Ser: 1.17 mg/dL — ABNORMAL HIGH (ref 0.57–1.00)
GFR calc Af Amer: 50 mL/min/{1.73_m2} — ABNORMAL LOW (ref >59)
GFR calc non Af Amer: 43 mL/min/{1.73_m2} — ABNORMAL LOW (ref >59)
Glucose: 157 mg/dL — ABNORMAL HIGH (ref 65–99)
Potassium: 4.3 mmol/L (ref 3.5–5.2)
Sodium: 139 mmol/L (ref 136–144)

## 2015-07-04 ENCOUNTER — Other Ambulatory Visit: Payer: Self-pay | Admitting: Internal Medicine

## 2015-07-04 ENCOUNTER — Telehealth: Payer: Self-pay | Admitting: *Deleted

## 2015-07-04 DIAGNOSIS — F0150 Vascular dementia without behavioral disturbance: Secondary | ICD-10-CM

## 2015-07-04 DIAGNOSIS — E1121 Type 2 diabetes mellitus with diabetic nephropathy: Secondary | ICD-10-CM

## 2015-07-04 DIAGNOSIS — I610 Nontraumatic intracerebral hemorrhage in hemisphere, subcortical: Secondary | ICD-10-CM

## 2015-07-04 NOTE — Telephone Encounter (Signed)
Ok, order has been placed

## 2015-07-04 NOTE — Telephone Encounter (Signed)
[name], Caregiver called and stated that they just saw you and you told them to call back if they decide if they want [facility] and they want you to go ahead and order home health care for her. Please Advise.

## 2015-07-12 ENCOUNTER — Telehealth: Payer: Self-pay | Admitting: *Deleted

## 2015-07-12 NOTE — Telephone Encounter (Signed)
[name], Caregiver called and stated that she was wondering of the status of Home Health, stated she called last week. Dorothy sent referral to [facility] and confirmed it. Earlie Server called [name] and they stated that they spoke with the son Friday and stated that something came up and they would be opening her up today or tomorrow. Called and informed caregiver.

## 2015-07-13 ENCOUNTER — Other Ambulatory Visit: Payer: Medicare Other

## 2015-07-14 ENCOUNTER — Telehealth: Payer: Self-pay

## 2015-07-14 ENCOUNTER — Encounter: Payer: Self-pay | Admitting: Neurology

## 2015-07-14 DIAGNOSIS — I130 Hypertensive heart and chronic kidney disease with heart failure and stage 1 through stage 4 chronic kidney disease, or unspecified chronic kidney disease: Secondary | ICD-10-CM | POA: Diagnosis not present

## 2015-07-14 DIAGNOSIS — I5032 Chronic diastolic (congestive) heart failure: Secondary | ICD-10-CM | POA: Diagnosis not present

## 2015-07-14 DIAGNOSIS — N189 Chronic kidney disease, unspecified: Secondary | ICD-10-CM | POA: Diagnosis not present

## 2015-07-14 DIAGNOSIS — E1122 Type 2 diabetes mellitus with diabetic chronic kidney disease: Secondary | ICD-10-CM | POA: Diagnosis not present

## 2015-07-14 DIAGNOSIS — F0150 Vascular dementia without behavioral disturbance: Secondary | ICD-10-CM | POA: Diagnosis not present

## 2015-07-14 DIAGNOSIS — E1121 Type 2 diabetes mellitus with diabetic nephropathy: Secondary | ICD-10-CM | POA: Diagnosis not present

## 2015-07-14 NOTE — Telephone Encounter (Signed)
1.) FYI gentivia will see patient 1 x 1, 2 x 4, and 1 x4  2.)Major drug interaction between clonidine, diltiazem, and metoprolol. Please advise  3.) Discoloration on left foot (great toe) - 1.8 cm x 1.5 cm, please advise if not already addressed in office

## 2015-07-14 NOTE — Telephone Encounter (Signed)
1) Ok 2) Pt was prescribed those three meds together by her cardiologist and has been on them for some time tolerating them just fine.   3)  Are pulses intact to the foot?  Does she have pain in the toe?  What color is it?  Is it numb?

## 2015-07-15 NOTE — Telephone Encounter (Signed)
Spoke with [name] (significant other), [name] verbalized understanding of patient staying on current medications as she has tolerated them well thus far. [name] states that the discoloration is a results of a lawn moyer injury several years ago, toe in question is black, no pain, and no numbness.   I called [name]) and she verbalized understanding of Dr.[name]'s response and response from [facility]. [facility] with have patient call for a sooner appointment (pending appt 08/15/15) if she mentions foot again.

## 2015-07-19 ENCOUNTER — Other Ambulatory Visit: Payer: Self-pay | Admitting: *Deleted

## 2015-07-19 DIAGNOSIS — I130 Hypertensive heart and chronic kidney disease with heart failure and stage 1 through stage 4 chronic kidney disease, or unspecified chronic kidney disease: Secondary | ICD-10-CM | POA: Diagnosis not present

## 2015-07-19 DIAGNOSIS — E1121 Type 2 diabetes mellitus with diabetic nephropathy: Secondary | ICD-10-CM | POA: Diagnosis not present

## 2015-07-19 DIAGNOSIS — E1122 Type 2 diabetes mellitus with diabetic chronic kidney disease: Secondary | ICD-10-CM | POA: Diagnosis not present

## 2015-07-19 DIAGNOSIS — F0150 Vascular dementia without behavioral disturbance: Secondary | ICD-10-CM | POA: Diagnosis not present

## 2015-07-19 MED ORDER — METOPROLOL TARTRATE 50 MG PO TABS: 50.0000 mg | ORAL_TABLET | Freq: Two times a day (BID) | ORAL | Status: DC

## 2015-07-19 NOTE — Telephone Encounter (Signed)
[facility] 

## 2015-07-20 ENCOUNTER — Telehealth: Payer: Self-pay | Admitting: *Deleted

## 2015-07-20 NOTE — Telephone Encounter (Signed)
Nicole [name] with [name] called requesting verbal orders for 3 x 1, 2 x 4. Verbal orders given.

## 2015-07-21 ENCOUNTER — Telehealth: Payer: Self-pay | Admitting: Internal Medicine

## 2015-07-21 NOTE — Telephone Encounter (Signed)
Pts God daughter, Nicole [name] called.  # [phone]-  Mrs Qualley feet is swelling.  Stated while in the [facility], they took her off of the fluid pills. Pt would like to know if a fluid pill could be  called into [facility] on [address]

## 2015-07-21 NOTE — Telephone Encounter (Signed)
I spoke with patient's God daughter. They are not monitoring sodium intake and patient is probably not elevating feet when at rest. The family will try these recommendations first and call back if fluid pill needed.

## 2015-07-21 NOTE — Telephone Encounter (Signed)
Are they watching her sodium intake and making sure she elevates her feet at rest?  If so, it's ok to restart lasix, but only 20mg  daily.  Will need follow up labs in one week (BMP) here to make sure she is not getting dehydrated.  She must continue to take her potassium.

## 2015-07-22 DIAGNOSIS — E1122 Type 2 diabetes mellitus with diabetic chronic kidney disease: Secondary | ICD-10-CM | POA: Diagnosis not present

## 2015-07-22 DIAGNOSIS — F0150 Vascular dementia without behavioral disturbance: Secondary | ICD-10-CM | POA: Diagnosis not present

## 2015-07-22 DIAGNOSIS — E1121 Type 2 diabetes mellitus with diabetic nephropathy: Secondary | ICD-10-CM | POA: Diagnosis not present

## 2015-07-22 DIAGNOSIS — I130 Hypertensive heart and chronic kidney disease with heart failure and stage 1 through stage 4 chronic kidney disease, or unspecified chronic kidney disease: Secondary | ICD-10-CM | POA: Diagnosis not present

## 2015-07-26 DIAGNOSIS — F0150 Vascular dementia without behavioral disturbance: Secondary | ICD-10-CM | POA: Diagnosis not present

## 2015-07-26 DIAGNOSIS — D485 Neoplasm of uncertain behavior of skin: Secondary | ICD-10-CM | POA: Diagnosis not present

## 2015-07-26 DIAGNOSIS — E1121 Type 2 diabetes mellitus with diabetic nephropathy: Secondary | ICD-10-CM | POA: Diagnosis not present

## 2015-07-26 DIAGNOSIS — L97529 Non-pressure chronic ulcer of other part of left foot with unspecified severity: Secondary | ICD-10-CM | POA: Diagnosis not present

## 2015-07-26 DIAGNOSIS — I130 Hypertensive heart and chronic kidney disease with heart failure and stage 1 through stage 4 chronic kidney disease, or unspecified chronic kidney disease: Secondary | ICD-10-CM | POA: Diagnosis not present

## 2015-07-26 DIAGNOSIS — L819 Disorder of pigmentation, unspecified: Secondary | ICD-10-CM | POA: Diagnosis not present

## 2015-07-26 DIAGNOSIS — E1122 Type 2 diabetes mellitus with diabetic chronic kidney disease: Secondary | ICD-10-CM | POA: Diagnosis not present

## 2015-07-27 ENCOUNTER — Telehealth: Payer: Self-pay | Admitting: *Deleted

## 2015-07-27 ENCOUNTER — Inpatient Hospital Stay (HOSPITAL_COMMUNITY)
Admission: EM | Admit: 2015-07-27 | Discharge: 2015-07-30 | DRG: 291 | Disposition: A | Discharge status: Home Health | Payer: Medicare Other | Attending: Internal Medicine | Admitting: Internal Medicine

## 2015-07-27 ENCOUNTER — Encounter (HOSPITAL_COMMUNITY): Payer: Self-pay | Admitting: *Deleted

## 2015-07-27 ENCOUNTER — Emergency Department (HOSPITAL_COMMUNITY): Payer: Medicare Other

## 2015-07-27 DIAGNOSIS — I428 Other cardiomyopathies: Secondary | ICD-10-CM | POA: Diagnosis present

## 2015-07-27 DIAGNOSIS — J189 Pneumonia, unspecified organism: Secondary | ICD-10-CM

## 2015-07-27 DIAGNOSIS — I4891 Unspecified atrial fibrillation: Secondary | ICD-10-CM

## 2015-07-27 DIAGNOSIS — R001 Bradycardia, unspecified: Secondary | ICD-10-CM | POA: Diagnosis present

## 2015-07-27 DIAGNOSIS — D649 Anemia, unspecified: Secondary | ICD-10-CM | POA: Diagnosis not present

## 2015-07-27 DIAGNOSIS — G9340 Encephalopathy, unspecified: Secondary | ICD-10-CM | POA: Diagnosis present

## 2015-07-27 DIAGNOSIS — E785 Hyperlipidemia, unspecified: Secondary | ICD-10-CM | POA: Diagnosis not present

## 2015-07-27 DIAGNOSIS — I130 Hypertensive heart and chronic kidney disease with heart failure and stage 1 through stage 4 chronic kidney disease, or unspecified chronic kidney disease: Principal | ICD-10-CM | POA: Diagnosis present

## 2015-07-27 DIAGNOSIS — E1121 Type 2 diabetes mellitus with diabetic nephropathy: Secondary | ICD-10-CM | POA: Diagnosis not present

## 2015-07-27 DIAGNOSIS — R079 Chest pain, unspecified: Secondary | ICD-10-CM | POA: Diagnosis not present

## 2015-07-27 DIAGNOSIS — F0150 Vascular dementia without behavioral disturbance: Secondary | ICD-10-CM | POA: Diagnosis present

## 2015-07-27 DIAGNOSIS — I272 Other secondary pulmonary hypertension: Secondary | ICD-10-CM | POA: Diagnosis not present

## 2015-07-27 DIAGNOSIS — Z7982 Long term (current) use of aspirin: Secondary | ICD-10-CM | POA: Diagnosis not present

## 2015-07-27 DIAGNOSIS — E039 Hypothyroidism, unspecified: Secondary | ICD-10-CM | POA: Diagnosis not present

## 2015-07-27 DIAGNOSIS — I482 Chronic atrial fibrillation, unspecified: Secondary | ICD-10-CM | POA: Diagnosis present

## 2015-07-27 DIAGNOSIS — R0789 Other chest pain: Secondary | ICD-10-CM | POA: Diagnosis not present

## 2015-07-27 DIAGNOSIS — I712 Thoracic aortic aneurysm, without rupture, unspecified: Secondary | ICD-10-CM | POA: Diagnosis present

## 2015-07-27 DIAGNOSIS — Z85038 Personal history of other malignant neoplasm of large intestine: Secondary | ICD-10-CM | POA: Diagnosis not present

## 2015-07-27 DIAGNOSIS — E119 Type 2 diabetes mellitus without complications: Secondary | ICD-10-CM | POA: Diagnosis not present

## 2015-07-27 DIAGNOSIS — I5023 Acute on chronic systolic (congestive) heart failure: Secondary | ICD-10-CM | POA: Diagnosis not present

## 2015-07-27 DIAGNOSIS — I5031 Acute diastolic (congestive) heart failure: Secondary | ICD-10-CM | POA: Diagnosis present

## 2015-07-27 DIAGNOSIS — E1122 Type 2 diabetes mellitus with diabetic chronic kidney disease: Secondary | ICD-10-CM | POA: Diagnosis not present

## 2015-07-27 DIAGNOSIS — R51 Headache: Secondary | ICD-10-CM | POA: Diagnosis not present

## 2015-07-27 DIAGNOSIS — N183 Chronic kidney disease, stage 3 (moderate): Secondary | ICD-10-CM | POA: Diagnosis present

## 2015-07-27 DIAGNOSIS — I5022 Chronic systolic (congestive) heart failure: Secondary | ICD-10-CM | POA: Diagnosis not present

## 2015-07-27 DIAGNOSIS — J90 Pleural effusion, not elsewhere classified: Secondary | ICD-10-CM | POA: Diagnosis not present

## 2015-07-27 DIAGNOSIS — J9811 Atelectasis: Secondary | ICD-10-CM | POA: Diagnosis not present

## 2015-07-27 DIAGNOSIS — I071 Rheumatic tricuspid insufficiency: Secondary | ICD-10-CM | POA: Diagnosis present

## 2015-07-27 DIAGNOSIS — Z91041 Radiographic dye allergy status: Secondary | ICD-10-CM | POA: Diagnosis not present

## 2015-07-27 DIAGNOSIS — E1142 Type 2 diabetes mellitus with diabetic polyneuropathy: Secondary | ICD-10-CM

## 2015-07-27 DIAGNOSIS — Z79899 Other long term (current) drug therapy: Secondary | ICD-10-CM | POA: Diagnosis not present

## 2015-07-27 DIAGNOSIS — K5909 Other constipation: Secondary | ICD-10-CM | POA: Diagnosis not present

## 2015-07-27 DIAGNOSIS — Z888 Allergy status to other drugs, medicaments and biological substances status: Secondary | ICD-10-CM

## 2015-07-27 DIAGNOSIS — I509 Heart failure, unspecified: Secondary | ICD-10-CM

## 2015-07-27 DIAGNOSIS — R519 Headache, unspecified: Secondary | ICD-10-CM

## 2015-07-27 LAB — CBC
HCT: 27 % — ABNORMAL LOW (ref 36.0–46.0)
HEMOGLOBIN: 8.6 g/dL — AB (ref 12.0–15.0)
MCH: 21.7 pg — AB (ref 26.0–34.0)
MCHC: 31.9 g/dL (ref 30.0–36.0)
MCV: 68 fL — AB (ref 78.0–100.0)
Platelets: 455 10*3/uL — ABNORMAL HIGH (ref 150–400)
RBC: 3.97 MIL/uL (ref 3.87–5.11)
RDW: 18.7 % — ABNORMAL HIGH (ref 11.5–15.5)
WBC: 8.5 10*3/uL (ref 4.0–10.5)

## 2015-07-27 LAB — I-STAT TROPONIN, ED: TROPONIN I, POC: 0.01 ng/mL (ref 0.00–0.08)

## 2015-07-27 LAB — GLUCOSE, CAPILLARY: GLUCOSE-CAPILLARY: 158 mg/dL — AB (ref 65–99)

## 2015-07-27 LAB — POC OCCULT BLOOD, ED: Fecal Occult Bld: NEGATIVE

## 2015-07-27 LAB — BASIC METABOLIC PANEL
ANION GAP: 10 (ref 5–15)
BUN: 17 mg/dL (ref 6–20)
CALCIUM: 8.9 mg/dL (ref 8.9–10.3)
CHLORIDE: 107 mmol/L (ref 101–111)
CO2: 22 mmol/L (ref 22–32)
Creatinine, Ser: 1.42 mg/dL — ABNORMAL HIGH (ref 0.44–1.00)
GFR calc non Af Amer: 33 mL/min — ABNORMAL LOW (ref >60)
GFR, EST AFRICAN AMERICAN: 39 mL/min — AB (ref >60)
Glucose, Bld: 175 mg/dL — ABNORMAL HIGH (ref 65–99)
Potassium: 4.7 mmol/L (ref 3.5–5.1)
Sodium: 139 mmol/L (ref 135–145)

## 2015-07-27 LAB — I-STAT CHEM 8, ED
BUN: 19 mg/dL (ref 6–20)
CREATININE: 1.5 mg/dL — AB (ref 0.44–1.00)
Calcium, Ion: 1.19 mmol/L (ref 1.13–1.30)
Chloride: 104 mmol/L (ref 101–111)
Glucose, Bld: 174 mg/dL — ABNORMAL HIGH (ref 65–99)
HEMATOCRIT: 33 % — AB (ref 36.0–46.0)
HEMOGLOBIN: 11.2 g/dL — AB (ref 12.0–15.0)
POTASSIUM: 4.5 mmol/L (ref 3.5–5.1)
SODIUM: 140 mmol/L (ref 135–145)
TCO2: 21 mmol/L (ref 0–100)

## 2015-07-27 MED ORDER — ASPIRIN EC 81 MG PO TBEC
81.0000 mg | DELAYED_RELEASE_TABLET | Freq: Every day | ORAL | Status: DC
  Administered 2015-07-28 – 2015-07-30 (×3): 81 mg via ORAL
  Filled 2015-07-27 (×3): qty 1

## 2015-07-27 MED ORDER — ONDANSETRON HCL 4 MG/2ML IJ SOLN: 4.0000 mg | Freq: Four times a day (QID) | INTRAMUSCULAR | Status: DC | PRN

## 2015-07-27 MED ORDER — ENALAPRILAT 1.25 MG/ML IV SOLN
2.5000 mg | Freq: Four times a day (QID) | INTRAVENOUS | Status: DC | PRN
  Administered 2015-07-28: 2.5 mg via INTRAVENOUS
  Filled 2015-07-27 (×2): qty 2

## 2015-07-27 MED ORDER — ALLOPURINOL 100 MG PO TABS
100.0000 mg | ORAL_TABLET | Freq: Every day | ORAL | Status: DC
  Administered 2015-07-28 – 2015-07-30 (×3): 100 mg via ORAL
  Filled 2015-07-27 (×3): qty 1

## 2015-07-27 MED ORDER — ONDANSETRON HCL 4 MG PO TABS: 4.0000 mg | ORAL_TABLET | Freq: Four times a day (QID) | ORAL | Status: DC | PRN

## 2015-07-27 MED ORDER — LEVOFLOXACIN IN D5W 750 MG/150ML IV SOLN
750.0000 mg | Freq: Once | INTRAVENOUS | Status: AC
  Administered 2015-07-27: 750 mg via INTRAVENOUS
  Filled 2015-07-27: qty 150

## 2015-07-27 MED ORDER — PANTOPRAZOLE SODIUM 40 MG PO PACK
40.0000 mg | PACK | Freq: Every day | ORAL | Status: DC
  Administered 2015-07-28 – 2015-07-30 (×3): 40 mg via ORAL
  Filled 2015-07-27 (×3): qty 20

## 2015-07-27 MED ORDER — ACETAMINOPHEN 325 MG PO TABS
650.0000 mg | ORAL_TABLET | Freq: Four times a day (QID) | ORAL | Status: DC | PRN
  Filled 2015-07-27: qty 2

## 2015-07-27 MED ORDER — ENSURE ENLIVE PO LIQD
237.0000 mL | Freq: Two times a day (BID) | ORAL | Status: DC
Start: 2015-07-28 — End: 2015-07-30
  Administered 2015-07-28 – 2015-07-30 (×4): 237 mL via ORAL

## 2015-07-27 MED ORDER — ATROPINE SULFATE 0.1 MG/ML IJ SOLN: 0.5000 mg | Freq: Once | INTRAMUSCULAR | Status: DC

## 2015-07-27 MED ORDER — SODIUM CHLORIDE 0.9 % IV SOLN
INTRAVENOUS | Status: DC
  Administered 2015-07-28: via INTRAVENOUS

## 2015-07-27 MED ORDER — LISINOPRIL 20 MG PO TABS: 20.0000 mg | ORAL_TABLET | Freq: Two times a day (BID) | ORAL | Status: DC

## 2015-07-27 MED ORDER — LEVOFLOXACIN IN D5W 750 MG/150ML IV SOLN: 750.0000 mg | INTRAVENOUS | Status: DC

## 2015-07-27 MED ORDER — LEVOTHYROXINE SODIUM 50 MCG PO TABS
50.0000 ug | ORAL_TABLET | Freq: Every day | ORAL | Status: DC
  Administered 2015-07-28 – 2015-07-30 (×3): 50 ug via ORAL
  Filled 2015-07-27 (×3): qty 1

## 2015-07-27 MED ORDER — ACETAMINOPHEN 650 MG RE SUPP: 650.0000 mg | Freq: Four times a day (QID) | RECTAL | Status: DC | PRN

## 2015-07-27 NOTE — ED Notes (Signed)
Dr. Wickline at bedside at this time.  

## 2015-07-27 NOTE — ED Notes (Signed)
Attempted to call report to floor. Secretary reported that RN did not answer. Left name & # with secretary for floor nurse to return call for report.

## 2015-07-27 NOTE — ED Notes (Signed)
Portable chest x-ray at this time.

## 2015-07-27 NOTE — Telephone Encounter (Signed)
She has afib.

## 2015-07-27 NOTE — Telephone Encounter (Signed)
[facility] Notified.

## 2015-07-27 NOTE — ED Notes (Signed)
Pt c/o constipation x 1 week, headache since yesterday and chest pain since this afternoon.

## 2015-07-27 NOTE — Telephone Encounter (Signed)
I recommend she use coricidin BP 2 tsp every 6 hours as needed for the cough.  She should be monitored carefully for fever and low oxygen levels.  Should she not be getting better or these develop, I would like to see her.

## 2015-07-27 NOTE — ED Provider Notes (Signed)
CSN: [id number]     Arrival date & time 07/27/15  1913 History   First MD Initiated Contact with Patient 07/27/15 1953     Chief Complaint  Patient presents with  . Chest Pain  . Headache  . Constipation    Patient is a 82 y.o. female presenting with chest pain, headaches, and constipation. The history is provided by the patient and a significant other.  Chest Pain Pain location:  Substernal area Pain quality: pressure   Pain radiates to:  Does not radiate Pain severity:  Moderate Onset quality:  Gradual Duration: "all day" Timing:  Constant Progression:  Resolved Chronicity:  New Relieved by:  Nothing Worsened by:  Nothing tried Associated symptoms: dizziness, fatigue, headache, near-syncope and shortness of breath   Associated symptoms: no fever and not vomiting   Headache Associated symptoms: dizziness, fatigue and near-syncope   Associated symptoms: no fever and no vomiting   Constipation Associated symptoms: no fever and no vomiting   pt reports for past day she has had chest pressure, SOB and feeling as though she might pass out Her chest pressure has resolved No vomiting/diarrhea She also mentions constipation and some HA as well No new meds reported   Past Medical History  Diagnosis Date  . Colon cancer ([place]) 07/1997  . Hypertension   . Diabetes mellitus   . CHF (congestive heart failure) ([place])   . AAA (abdominal aortic aneurysm) ([place])   . Atrial fibrillation ([place])   . Hypercholesteremia   . Kidney stone     renal calculi  . Small bowel obstruction due to adhesions ([place]) 05/16/2012  . Hypothyroidism   . Arthritis   . Anemia   . Cholelithiasis   . Perforation of bile duct   . Hemorrhage of rectum and anus   . Swelling, mass, or lump in head and neck   . Cervicalgia   . Hypoglycemia, unspecified   . Pain in joint, lower leg   . Anxiety   . Shortness of breath   . Acute upper respiratory infections of unspecified site   . Depression   . Thoracic  aneurysm without mention of rupture   . GERD (gastroesophageal reflux disease)   . Proteinuria   . Other specified cardiac dysrhythmias(427.89)   . Congestive heart failure, unspecified   . Electrolyte and fluid disorders not elsewhere classified   . Other chronic allergic conjunctivitis   . Hypopotassemia   . Chronic systolic heart failure ([place])   . Dizziness and giddiness   . Malignant neoplasm of colon, unspecified site   . Other and unspecified hyperlipidemia   . Gout, unspecified   . Atrial fibrillation ([place])   . Aortic aneurysm of unspecified site without mention of rupture   . Pain in joint, site unspecified   . Palpitations   . CKD (chronic kidney disease)   . Diabetes mellitus [facility])    Past Surgical History  Procedure Laterality Date  . Hemicolectomy  08/11/1997  . Kidney stone surgery    . Colon surgery      to remove colon ca  . Cholecystectomy  05/14/2012    Procedure: LAPAROSCOPIC CHOLECYSTECTOMY WITH INTRAOPERATIVE CHOLANGIOGRAM;  Surgeon: Haywood Lasso, MD;  Location: [facility];  Service: General;  Laterality: N/A;  . Ercp  05/17/2012    Procedure: ENDOSCOPIC RETROGRADE CHOLANGIOPANCREATOGRAPHY (ERCP);  Surgeon: Missy Sabins, MD;  Location: [place];  Service: Gastroenterology;  Laterality: N/A;  . Ercp  08/12/2012    Procedure: ENDOSCOPIC RETROGRADE CHOLANGIOPANCREATOGRAPHY (ERCP);  Surgeon: Missy Sabins, MD;  [facility];  Service: Endoscopy;  Laterality: N/A;  . Transthoracic echocardiogram  11/2009    EF 45-50%, mild-mod AV regurg; mild MR; mod TR; ascending aorta mildly dilated  . Cardiac catheterization  2008    moderate severe pulm HTN; with elevted pulm capillary wedge pressure    Family History  Problem Relation Age of Onset  . Heart disease Mother   . Stroke Father   . Hypertension Daughter   . Hypertension Son   . Diabetes Sister   . Hypertension Son    Social History  Substance Use Topics  . Smoking status: Never Smoker   . Smokeless  tobacco: Never Used  . Alcohol Use: No   OB History    No data available     Review of Systems  Constitutional: Positive for fatigue. Negative for fever.  Respiratory: Positive for shortness of breath.   Cardiovascular: Positive for chest pain and near-syncope.  Gastrointestinal: Positive for constipation. Negative for vomiting.  Neurological: Positive for dizziness and headaches. Negative for syncope.  All other systems reviewed and are negative.     Allergies  Iohexol; Z-pak; and Iodinated diagnostic agents  Home Medications   Prior to Admission medications   Medication Sig Start Date End Date Taking? Authorizing Provider  allopurinol (ZYLOPRIM) 100 MG tablet Take 100 mg by mouth daily.  05/05/15   Blanchie Serve, MD  aspirin EC 81 MG tablet Take 81 mg by mouth daily.    Historical Provider, MD  Blood Glucose Calibration (ACCU-CHEK AVIVA) SOLN See admin instructions. 05/30/15   Historical Provider, MD  cloNIDine (CATAPRES) 0.2 MG tablet Take one tablet by mouth three times daily for hypertension Patient taking differently: Take 0.2 mg by mouth 3 (three) times daily.  06/14/15   [name] [name], DO  diltiazem (CARDIZEM) 60 MG tablet Take one tablet by mouth four times daily for hypertension 06/06/15   [name] [name], DO  donepezil (ARICEPT) 5 MG tablet Take 1 tablet (5 mg total) by mouth at bedtime. Start one tablet daily x 4 weeks then two tablets daily 06/13/15   Garvin Fila, MD  EASY COMFORT LANCETS MISC 3 (three) times daily. for testing 05/30/15   Historical Provider, MD  levothyroxine (SYNTHROID, LEVOTHROID) 50 MCG tablet Take one tablet by mouth 30 minutes before breakfast away from other medications for thyroid 05/05/15   Blanchie Serve, MD  lisinopril (PRINIVIL,ZESTRIL) 20 MG tablet Take one tablet by mouth twice daily for hypertension 07/01/15   [name] [name], DO  Melatonin-Pyridoxine (MELATIN PO) Take 1 tablet by mouth at bedtime as needed. For sleep    Historical  Provider, MD  metoprolol (LOPRESSOR) 50 MG tablet Take 1 tablet (50 mg total) by mouth 2 (two) times daily. 07/19/15   [name] [name], DO  pantoprazole sodium (PROTONIX) 40 mg/20 mL PACK Take 40 mg by mouth daily.     Historical Provider, MD  Potassium Gluconate 2.5 MEQ TABS Take four capsules by mouth twice daily for potassium supplement    Historical Provider, MD  zoster vaccine live, PF, (ZOSTAVAX) 13086 UNT/0.65ML injection Inject 19,400 Units into the skin once. 07/01/15   [name] [name], DO   BP 127/56 mmHg  Pulse 25  Temp(Src) 97.6 F (36.4 C) (Axillary)  Resp 16  SpO2 100% Physical Exam CONSTITUTIONAL: Well developed/well nourished HEAD: Normocephalic/atraumatic EYES: EOMI/PERRL ENMT: Mucous membranes moist NECK: supple no meningeal signs SPINE/BACK:entire spine nontender CV: irregular, bradycardic LUNGS: Lungs are clear  to auscultation bilaterally ABDOMEN: soft, nontender, no rebound or guarding, bowel sounds noted throughout abdomen NEURO: Pt is awake/alert/appropriate, moves all extremitiesx4.   EXTREMITIES: pulses normal/equal, full ROM SKIN: warm, color normal PSYCH: no abnormalities of mood noted, alert and oriented to situation  ED Course  Procedures  CRITICAL CARE Performed by: Sharyon Cable Total critical care time: 35 minutes Critical care time was exclusive of separately billable procedures and treating other patients. Critical care was necessary to treat or prevent imminent or life-threatening deterioration. Critical care was time spent personally by me on the following activities: development of treatment plan with patient and/or surrogate as well as nursing, discussions with consultants, evaluation of patient's response to treatment, examination of patient, obtaining history from patient or surrogate, ordering and performing treatments and interventions, ordering and review of laboratory studies, ordering and review of radiographic studies, pulse oximetry  and re-evaluation of patient's condition. PATIENT WITH BRADYCARDIC EPISODES, WITH HR LESS THAN 40 AT TIMES  8:07 PM Pt with episodes of bradycardia/slow afib that corrects spontaneously BP is normal Labs/imaging ordered Will follow closely 8:32 PM HR trending from 30-40s, but pt asymptomatic and BP appropriate 8:43 PM D/w dr Philbert Riser with cardiology He reviewed ekg's likely slow afib Hold all rate control agents Treat underlying medical conditions Atropine not likely to be effective 9:30 PM HR IMPROVING PT STABLE D/W DR KAKRAKANDY TO ADMIT TO STEPDOWN WILL HOLD CARDIZEM/LOPRESSOR/CLONIDINE WILL TREAT PNEUMONIA PT NOTED TO BE ANEMIC, BUT NURSING DID HEMOCCULT THAT WAS NEGATIVE  Labs Review Labs Reviewed  BASIC METABOLIC PANEL - Abnormal; Notable for the following:    Glucose, Bld 175 (*)    Creatinine, Ser 1.42 (*)    GFR calc non Af Amer 33 (*)    GFR calc Af Amer 39 (*)    All other components within normal limits  CBC - Abnormal; Notable for the following:    Hemoglobin 8.6 (*)    HCT 27.0 (*)    MCV 68.0 (*)    MCH 21.7 (*)    RDW 18.7 (*)    Platelets 455 (*)    All other components within normal limits  I-STAT CHEM 8, ED - Abnormal; Notable for the following:    Creatinine, Ser 1.50 (*)    Glucose, Bld 174 (*)    Hemoglobin 11.2 (*)    HCT 33.0 (*)    All other components within normal limits  I-STAT TROPOININ, ED  POC OCCULT BLOOD, ED    Imaging Review Dg Chest Port 1 View  07/27/2015  CLINICAL DATA:  Chest pain EXAM: PORTABLE CHEST - 1 VIEW COMPARISON:  04/09/2015 FINDINGS: Cardiac shadow is mildly enlarged. Increased density is noted in the right lung base consistent with early infiltrate with associated small effusion. The bony structures are within normal limits. The left lung is clear. IMPRESSION: Mild right basilar infiltrate with associated effusion. Electronically Signed   By: Inez Catalina M.D.   On: 07/27/2015 20:12   I have personally reviewed and  evaluated these images and lab results as part of my medical decision-making.   EKG Interpretation   Date/Time:  Wednesday July 27 2015 20:00:20 EST Ventricular Rate:  40 PR Interval:    QRS Duration: 120 QT Interval:  590 QTC Calculation: 481 R Axis:   57 Text Interpretation:  Atrial fibrillation Nonspecific intraventricular  conduction delay Nonspecific T abnrm, anterolateral leads Abnormal ekg  Confirmed by Christy Gentles  MD, Esley Brooking ([id number]) on 07/27/2015 8:04:04 PM     Medications  levofloxacin (  LEVAQUIN) IVPB 750 mg (750 mg Intravenous New Bag/Given 07/27/15 2112)    MDM   Final diagnoses:  CAP (community acquired pneumonia)  Bradycardia  Atrial fibrillation with slow ventricular response (HCC)  Anemia, unspecified anemia type    Nursing notes including past medical history and social history reviewed and considered in documentation xrays/imaging reviewed by myself and considered during evaluation Labs/vital reviewed myself and considered during evaluation     Ripley Fraise, MD 07/27/15 2131

## 2015-07-27 NOTE — Telephone Encounter (Signed)
LeeStar with [name] also wanted to let you know that patient has had irregular heartbeat ever since she started seeing the patient.

## 2015-07-27 NOTE — Telephone Encounter (Signed)
Nicole [name] with [name] called and stated that there is a  Black area on Left Great Toe, Podiatrist Biopsied and awaiting results. Patient fell last week when she missed the chair but caregiver was able to ease her fall, No injury. Patient does have a cough with some congestion but not coughing up anything. Would like something for this. Please Advise.

## 2015-07-27 NOTE — H&P (Signed)
[facility] History and Physical  Nicole [name] [id number] DOB: 01-Oct-1930 DOA: 07/27/2015  Referring physician: Dr. Christy Gentles. PCP: Hollace Kinnier, DO  Specialists: Dr. Ellyn Hack. Cardiologist.  Chief Complaint: Chest pain.  HPI: Nicole [name] is a 82 y.o. female with history of systolic heart failure, atrial fibrillation not on anticoagulation secondary to intracranial hemorrhage, thoracic aortic aneurysm, diabetes mellitus type 2, dyslipidemia, chronic kidney disease presented to the ER because of chest pain. Patient may have just been off and on for last few days. Pain is mostly retrosternal. Nonradiating present even at rest. In addition patient has been having some productive cough. Denies any fever or chills. Chest x-ray shows possible pneumonia. EKG shows bradycardia. On-call cardiologist was consulted and at this time they have requested to hold off rate limiting medications. Patient has been admitted for further management. Denies any abdominal pain nausea vomiting.   Review of Systems: As presented in the history of presenting illness, rest negative.  Past Medical History  Diagnosis Date  . Colon cancer ([place]) 07/1997  . Hypertension   . Diabetes mellitus   . CHF (congestive heart failure) ([place])   . AAA (abdominal aortic aneurysm) ([place])   . Atrial fibrillation ([place])   . Hypercholesteremia   . Kidney stone     renal calculi  . Small bowel obstruction due to adhesions ([place]) 05/16/2012  . Hypothyroidism   . Arthritis   . Anemia   . Cholelithiasis   . Perforation of bile duct   . Hemorrhage of rectum and anus   . Swelling, mass, or lump in head and neck   . Cervicalgia   . Hypoglycemia, unspecified   . Pain in joint, lower leg   . Anxiety   . Shortness of breath   . Acute upper respiratory infections of unspecified site   . Depression   . Thoracic aneurysm without mention of rupture   . GERD (gastroesophageal reflux disease)   . Proteinuria   . Other  specified cardiac dysrhythmias(427.89)   . Congestive heart failure, unspecified   . Electrolyte and fluid disorders not elsewhere classified   . Other chronic allergic conjunctivitis   . Hypopotassemia   . Chronic systolic heart failure ([place])   . Dizziness and giddiness   . Malignant neoplasm of colon, unspecified site   . Other and unspecified hyperlipidemia   . Gout, unspecified   . Atrial fibrillation ([place])   . Aortic aneurysm of unspecified site without mention of rupture   . Pain in joint, site unspecified   . Palpitations   . CKD (chronic kidney disease)   . Diabetes mellitus [facility])    Past Surgical History  Procedure Laterality Date  . Hemicolectomy  08/11/1997  . Kidney stone surgery    . Colon surgery      to remove colon ca  . Cholecystectomy  05/14/2012    Procedure: LAPAROSCOPIC CHOLECYSTECTOMY WITH INTRAOPERATIVE CHOLANGIOGRAM;  Surgeon: Haywood Lasso, MD;  Location: [place];  Service: General;  Laterality: N/A;  . Ercp  05/17/2012    Procedure: ENDOSCOPIC RETROGRADE CHOLANGIOPANCREATOGRAPHY (ERCP);  Surgeon: Missy Sabins, MD;  Location: [place];  Service: Gastroenterology;  Laterality: N/A;  . Ercp  08/12/2012    Procedure: ENDOSCOPIC RETROGRADE CHOLANGIOPANCREATOGRAPHY (ERCP);  Surgeon: Missy Sabins, MD;  Location: [facility] ENDOSCOPY;  Service: Endoscopy;  Laterality: N/A;  . Transthoracic echocardiogram  11/2009    EF 45-50%, mild-mod AV regurg; mild MR; mod TR; ascending aorta mildly dilated  . Cardiac catheterization  2008  moderate severe pulm HTN; with elevted pulm capillary wedge pressure    Social History:  reports that she has never smoked. She has never used smokeless tobacco. She reports that she does not drink alcohol or use illicit drugs. Where does patient live home. Can patient participate in ADLs? Yes.  Allergies  Allergen Reactions  . Iohexol      Code: VOM, Desc: PT REPORTS VOMITING W/ IVP DYE- ARS 12/26/08, Onset Date: [id number]   . Z-Pak  [Azithromycin]   . Iodinated Diagnostic Agents Nausea Only    PT HAS HAD SINGULAR INCIDENT OF NAUSEA  W/ IV CONTYRAST INJECTION, NONE IF INJECTED SLOWLY//A.[place]    Family History:  Family History  Problem Relation Age of Onset  . Heart disease Mother   . Stroke Father   . Hypertension Daughter   . Hypertension Son   . Diabetes Sister   . Hypertension Son       Prior to Admission medications   Medication Sig Start Date End Date Taking? Authorizing Provider  allopurinol (ZYLOPRIM) 100 MG tablet Take 100 mg by mouth daily.  05/05/15  Yes Mahima Bubba Camp, MD  aspirin EC 81 MG tablet Take 81 mg by mouth daily.   Yes Historical Provider, MD  cloNIDine (CATAPRES) 0.2 MG tablet Take one tablet by mouth three times daily for hypertension Patient taking differently: Take 0.2 mg by mouth 3 (three) times daily.  06/14/15  Yes [name] [name], DO  diltiazem (CARDIZEM) 60 MG tablet Take one tablet by mouth four times daily for hypertension 06/06/15  Yes [name] [name], DO  donepezil (ARICEPT) 5 MG tablet Take 1 tablet (5 mg total) by mouth at bedtime. Start one tablet daily x 4 weeks then two tablets daily 06/13/15  Yes Garvin Fila, MD  levothyroxine (SYNTHROID, LEVOTHROID) 50 MCG tablet Take one tablet by mouth 30 minutes before breakfast away from other medications for thyroid 05/05/15  Yes Mahima Bubba Camp, MD  lisinopril (PRINIVIL,ZESTRIL) 20 MG tablet Take one tablet by mouth twice daily for hypertension 07/01/15  Yes [name] [name], DO  Melatonin-Pyridoxine (MELATIN PO) Take 1 tablet by mouth at bedtime as needed. For sleep   Yes Historical Provider, MD  metoprolol (LOPRESSOR) 50 MG tablet Take 1 tablet (50 mg total) by mouth 2 (two) times daily. 07/19/15  Yes [name] [name], DO  pantoprazole sodium (PROTONIX) 40 mg/20 mL PACK Take 40 mg by mouth daily.    Yes Historical Provider, MD  Potassium Gluconate 2.5 MEQ TABS Take four capsules by mouth twice daily for potassium supplement   Yes  Historical Provider, MD  Blood Glucose Calibration (ACCU-CHEK AVIVA) SOLN See admin instructions. 05/30/15   Historical Provider, MD  EASY COMFORT LANCETS MISC 3 (three) times daily. for testing 05/30/15   Historical Provider, MD  zoster vaccine live, PF, (ZOSTAVAX) 29562 UNT/0.65ML injection Inject 19,400 Units into the skin once. 07/01/15   [name], DO    Physical Exam: Filed Vitals:   07/27/15 2145 07/27/15 2200 07/27/15 2258 07/27/15 2300  BP: 130/65 120/79 164/86 157/80  Pulse: 57 54 56   Temp:   98.6 F (37 C)   TempSrc:   Oral   Resp: 20 18 20 19   Height:   5\' 8"  (1.727 m)   Weight:   71.3 kg (157 lb 3 oz)   SpO2: 98% 100% 99% 98%     General:  Moderately built and nourished.  Eyes: Anicteric. No pallor.  ENT: No discharge from the ears  eyes nose and mouth.  Neck: No mass felt.  Cardiovascular: S1 and S2 heard.  Respiratory: No rhonchi or crepitations.  Abdomen: Soft nontender bowel sounds present. No guarding or rigidity.  Skin: No rash.  Musculoskeletal: No edema.  Psychiatric: Appears normal.  Neurologic: Alert awake oriented to time place and person. Moves all extremities.  Labs on Admission:  Basic Metabolic Panel:  Recent Labs Lab 07/27/15 2007 07/27/15 2008  NA 140 139  K 4.5 4.7  CL 104 107  CO2  --  22  GLUCOSE 174* 175*  BUN 19 17  CREATININE 1.50* 1.42*  CALCIUM  --  8.9   Liver Function Tests: No results for input(s): AST, ALT, ALKPHOS, BILITOT, PROT, ALBUMIN in the last 168 hours. No results for input(s): LIPASE, AMYLASE in the last 168 hours. No results for input(s): AMMONIA in the last 168 hours. CBC:  Recent Labs Lab 07/27/15 2007 07/27/15 2008  WBC  --  8.5  HGB 11.2* 8.6*  HCT 33.0* 27.0*  MCV  --  68.0*  PLT  --  455*   Cardiac Enzymes: No results for input(s): CKTOTAL, CKMB, CKMBINDEX, TROPONINI in the last 168 hours.  BNP (last 3 results)  Recent Labs  04/04/15 0945  BNP 214.8*    ProBNP (last 3  results) No results for input(s): PROBNP in the last 8760 hours.  CBG: No results for input(s): GLUCAP in the last 168 hours.  Radiological Exams on Admission: Dg Chest Port 1 View  07/27/2015  CLINICAL DATA:  Chest pain EXAM: PORTABLE CHEST - 1 VIEW COMPARISON:  04/09/2015 FINDINGS: Cardiac shadow is mildly enlarged. Increased density is noted in the right lung base consistent with early infiltrate with associated small effusion. The bony structures are within normal limits. The left lung is clear. IMPRESSION: Mild right basilar infiltrate with associated effusion. Electronically Signed   By: Inez Catalina M.D.   On: 07/27/2015 20:12    EKG: Independently reviewed. A. fib with bradycardia.  Assessment/Plan Principal Problem:   CAP (community acquired pneumonia) Active Problems:   Thoracic aortic aneurysm, 4.8cm 2011   CHF, past NICM with an EF of 30%, most recent echo 2011 showed EF to be45- 50%   Chronic atrial fibrillation (HCC)   Bradycardia   Chronic anemia   Pneumonia   Chest pain   1. Pneumonia - patient has been placed on Levaquin. Check urine strep antigen Legionella antigen and influenza PCR. 2. Chest pain - given history of thoracic aortic aneurysm measuring 4.8 cm I have discussed with the radiologist. Patient has chronic kidney disease allergic to dye. We will get a CT chest without contrast and try to measure the size of the aneurysm. Cycle cardiac markers. Check 2-D echo. 3. Bradycardia with history of atrial fibrillation - at this time and holding of patient's rate limiting medications and Aricept. Holding off clonidine also. Closely follow heart rate. 4. Hypertension - since patient's antihypertensives: I have placed patient on when necessary IV Vasotec. 5. Systolic heart failure last EF was 45% - closely follow intake output and metabolic panel and daily weights. 6. CKD stage 3 - creatinine appears to be at baseline. 7. Chronic Anemia - follow CBC. 8. Headache - the  history of intracranial hemorrhage follow CT head.   DVT Prophylaxis SCDs until CT results are available.  Code Status: Full code.  Family Communication: Discussed with patient.  Disposition Plan: Admit to inpatient.    Won Kreuzer N. [facility] Pager [phone].  If 7PM-7AM, please contact  night-coverage [email or web address] Password [email or web address] 07/27/2015, 11:44 PM

## 2015-07-28 ENCOUNTER — Inpatient Hospital Stay (HOSPITAL_COMMUNITY): Payer: Medicare Other

## 2015-07-28 DIAGNOSIS — K5900 Constipation, unspecified: Secondary | ICD-10-CM

## 2015-07-28 DIAGNOSIS — E039 Hypothyroidism, unspecified: Secondary | ICD-10-CM

## 2015-07-28 DIAGNOSIS — D649 Anemia, unspecified: Secondary | ICD-10-CM

## 2015-07-28 DIAGNOSIS — R079 Chest pain, unspecified: Secondary | ICD-10-CM

## 2015-07-28 DIAGNOSIS — I482 Chronic atrial fibrillation: Secondary | ICD-10-CM

## 2015-07-28 DIAGNOSIS — R001 Bradycardia, unspecified: Secondary | ICD-10-CM

## 2015-07-28 DIAGNOSIS — I712 Thoracic aortic aneurysm, without rupture: Secondary | ICD-10-CM

## 2015-07-28 DIAGNOSIS — I5022 Chronic systolic (congestive) heart failure: Secondary | ICD-10-CM

## 2015-07-28 LAB — URINALYSIS W MICROSCOPIC (NOT AT ARMC)
Bilirubin Urine: NEGATIVE
GLUCOSE, UA: NEGATIVE mg/dL
HGB URINE DIPSTICK: NEGATIVE
Ketones, ur: NEGATIVE mg/dL
Nitrite: NEGATIVE
PROTEIN: NEGATIVE mg/dL
RBC / HPF: NONE SEEN RBC/hpf (ref 0–5)
Specific Gravity, Urine: 1.01 (ref 1.005–1.030)
pH: 7 (ref 5.0–8.0)

## 2015-07-28 LAB — BASIC METABOLIC PANEL
Anion gap: 7 (ref 5–15)
BUN: 15 mg/dL (ref 6–20)
CHLORIDE: 107 mmol/L (ref 101–111)
CO2: 25 mmol/L (ref 22–32)
Calcium: 8.7 mg/dL — ABNORMAL LOW (ref 8.9–10.3)
Creatinine, Ser: 1.24 mg/dL — ABNORMAL HIGH (ref 0.44–1.00)
GFR calc Af Amer: 46 mL/min — ABNORMAL LOW (ref >60)
GFR calc non Af Amer: 39 mL/min — ABNORMAL LOW (ref >60)
GLUCOSE: 121 mg/dL — AB (ref 65–99)
POTASSIUM: 4.1 mmol/L (ref 3.5–5.1)
Sodium: 139 mmol/L (ref 135–145)

## 2015-07-28 LAB — BRAIN NATRIURETIC PEPTIDE: B Natriuretic Peptide: 1037.7 pg/mL — ABNORMAL HIGH (ref 0.0–100.0)

## 2015-07-28 LAB — CBC
HEMATOCRIT: 25.6 % — AB (ref 36.0–46.0)
Hemoglobin: 8.4 g/dL — ABNORMAL LOW (ref 12.0–15.0)
MCH: 22.4 pg — AB (ref 26.0–34.0)
MCHC: 32.8 g/dL (ref 30.0–36.0)
MCV: 68.3 fL — AB (ref 78.0–100.0)
Platelets: 456 10*3/uL — ABNORMAL HIGH (ref 150–400)
RBC: 3.75 MIL/uL — ABNORMAL LOW (ref 3.87–5.11)
RDW: 18.9 % — AB (ref 11.5–15.5)
WBC: 7.9 10*3/uL (ref 4.0–10.5)

## 2015-07-28 LAB — C-REACTIVE PROTEIN: CRP: 2.9 mg/dL — AB (ref <1.0)

## 2015-07-28 LAB — TROPONIN I: Troponin I: <0.03 ng/mL (ref <0.031)

## 2015-07-28 LAB — INFLUENZA PANEL BY PCR (TYPE A & B)
H1N1FLUPCR: NOT DETECTED
Influenza A By PCR: NEGATIVE
Influenza B By PCR: NEGATIVE

## 2015-07-28 LAB — HIV ANTIBODY (ROUTINE TESTING W REFLEX): HIV Screen 4th Generation wRfx: NONREACTIVE

## 2015-07-28 LAB — MRSA PCR SCREENING: MRSA BY PCR: NEGATIVE

## 2015-07-28 LAB — SEDIMENTATION RATE: Sed Rate: 12 mm/hr (ref 0–22)

## 2015-07-28 LAB — STREP PNEUMONIAE URINARY ANTIGEN: Strep Pneumo Urinary Antigen: NEGATIVE

## 2015-07-28 MED ORDER — POLYETHYLENE GLYCOL 3350 17 G PO PACK
17.0000 g | PACK | Freq: Every day | ORAL | Status: DC
  Administered 2015-07-28 – 2015-07-30 (×3): 17 g via ORAL
  Filled 2015-07-28 (×3): qty 1

## 2015-07-28 MED ORDER — LISINOPRIL 20 MG PO TABS
20.0000 mg | ORAL_TABLET | Freq: Two times a day (BID) | ORAL | Status: DC
  Administered 2015-07-28 – 2015-07-30 (×5): 20 mg via ORAL
  Filled 2015-07-28 (×5): qty 1

## 2015-07-28 MED ORDER — HYDRALAZINE HCL 20 MG/ML IJ SOLN
10.0000 mg | INTRAMUSCULAR | Status: DC | PRN
  Administered 2015-07-28 (×2): 10 mg via INTRAVENOUS
  Filled 2015-07-28 (×2): qty 1

## 2015-07-28 MED ORDER — METOPROLOL TARTRATE 50 MG PO TABS
50.0000 mg | ORAL_TABLET | Freq: Two times a day (BID) | ORAL | Status: DC
  Administered 2015-07-28 – 2015-07-30 (×4): 50 mg via ORAL
  Filled 2015-07-28 (×5): qty 1

## 2015-07-28 MED ORDER — CLONIDINE HCL 0.2 MG PO TABS
0.2000 mg | ORAL_TABLET | Freq: Three times a day (TID) | ORAL | Status: DC
  Administered 2015-07-28 – 2015-07-30 (×6): 0.2 mg via ORAL
  Filled 2015-07-28: qty 1
  Filled 2015-07-28: qty 2
  Filled 2015-07-28 (×2): qty 1
  Filled 2015-07-28 (×2): qty 2

## 2015-07-28 MED ORDER — GLUCERNA SHAKE PO LIQD
237.0000 mL | Freq: Three times a day (TID) | ORAL | Status: DC
  Administered 2015-07-28 – 2015-07-29 (×3): 237 mL via ORAL

## 2015-07-28 MED ORDER — ENOXAPARIN SODIUM 30 MG/0.3ML ~~LOC~~ SOLN
30.0000 mg | SUBCUTANEOUS | Status: DC
  Administered 2015-07-28 – 2015-07-29 (×2): 30 mg via SUBCUTANEOUS
  Filled 2015-07-28 (×2): qty 0.3

## 2015-07-28 MED ORDER — FUROSEMIDE 10 MG/ML IJ SOLN
40.0000 mg | Freq: Once | INTRAMUSCULAR | Status: AC
  Administered 2015-07-28: 40 mg via INTRAVENOUS
  Filled 2015-07-28: qty 4

## 2015-07-28 MED ORDER — MAGNESIUM HYDROXIDE 400 MG/5ML PO SUSP: 15.0000 mL | Freq: Every day | ORAL | Status: DC | PRN

## 2015-07-28 MED ORDER — CLONIDINE HCL 0.1 MG PO TABS
0.1000 mg | ORAL_TABLET | Freq: Two times a day (BID) | ORAL | Status: DC
  Administered 2015-07-28: 0.1 mg via ORAL
  Filled 2015-07-28: qty 1

## 2015-07-28 MED ORDER — SENNOSIDES-DOCUSATE SODIUM 8.6-50 MG PO TABS
1.0000 | ORAL_TABLET | Freq: Two times a day (BID) | ORAL | Status: DC
  Administered 2015-07-28 – 2015-07-30 (×5): 1 via ORAL
  Filled 2015-07-28 (×5): qty 1

## 2015-07-28 NOTE — Progress Notes (Signed)
Echocardiogram 2D Echocardiogram has been performed.  Tresa Res 07/28/2015, 12:49 PM

## 2015-07-28 NOTE — Progress Notes (Addendum)
[facility] Progress Note  Patient: Nicole [name] [id number]   PCP: Hollace Kinnier, DO DOB: 05/27/1931   DOA: 07/27/2015   DOS: 07/28/2015   Date of Service: the patient was seen and examined on 07/28/2015  Subjective: Patient does not have any ongoing chest pain. No shortness of breath no cough. No prior history of fever. Complains of on and off leg swelling. Nutrition: Able to tolerate oral diet Activity: Admitting in the room Last BM: Prior to arrival  Assessment and Plan: 1. Chest pain Patient presents with on and off chest pain which is ongoing for last 1 month and progressively worsening. Serial troponins are negative. CT chest does not show any acute abnormality. No WMA on TTE, normal EF diastolic dysfunction.   2. Bradycardia, chronic A. fib Accelerated hypertension. Initially on presentation the patient was bradycardic currently her heart rate has improved. We'll resume home medication. Clonidine dose resumed as well.  3. Thoracic aneurysm. 4.6 cm on a CT scan. We'll continue monitoring.  4. Questionable pneumonia. CT scan shows basal atelectasis and no evidence of consultation or abdomen bronchogram. We will use incentive spirometry. No need for antibiotic at present.  5. Hypothyroidism. Next on continuing Synthroid.  6. Chronic constipation. Bowel regimen ordered.  7.acute on  Chronic systolic CHF. BNP elevated then baseline and TTE shows EF 55-60% and no wall motion abnormality. Will give her 40 mg lasix.  8. chronic anemia. Next on H&H stable. Continuing monitoring.  9. Headache. CT of the head is unremarkable. Will resume aspirin and DVT prophylaxis.  DVT Prophylaxis: subcutaneous Heparin Nutrition: Cardiac diet Advance goals of care discussion: Full code  Brief Summary of Hospitalization:  HPI: As per the H and P dictated on admission, "Nicole [name] is a 82 y.o. female with history of systolic heart failure, atrial fibrillation not on  anticoagulation secondary to intracranial hemorrhage, thoracic aortic aneurysm, diabetes mellitus type 2, dyslipidemia, chronic kidney disease presented to the ER because of chest pain. Patient may have just been off and on for last few days. Pain is mostly retrosternal. Nonradiating present even at rest. In addition patient has been having some productive cough. Denies any fever or chills. Chest x-ray shows possible pneumonia. EKG shows bradycardia. On-call cardiologist was consulted and at this time they have requested to hold off rate limiting medications. Patient has been admitted for further management. Denies any abdominal pain nausea vomiting." Daily update, Procedures: Admitted on 07/27/2015, echocardiogram on 07/28/2015 Consultants: Phone consultation with cardiology Antibiotics: Anti-infectives    Start     Dose/Rate Route Frequency Ordered Stop   07/29/15 2000  levofloxacin (LEVAQUIN) IVPB 750 mg  Status:  Discontinued     750 mg 100 mL/hr over 90 Minutes Intravenous Every 48 hours 07/27/15 2343 07/28/15 0808   07/27/15 2100  levofloxacin (LEVAQUIN) IVPB 750 mg     750 mg 100 mL/hr over 90 Minutes Intravenous  Once 07/27/15 2059 07/27/15 2330       Family Communication: No family was present at bedside, at the time of interview.   Disposition:  Expected discharge date: 07/29/2015 Barriers to safe discharge: Workup cardiac   Intake/Output Summary (Last 24 hours) at 07/28/15 1235 Last data filed at 07/28/15 0645  Gross per 24 hour  Intake  209.5 ml  Output      6 ml  Net  203.5 ml   Filed Weights   07/27/15 2258 07/28/15 0500  Weight: 71.3 kg (157 lb 3 oz) 71.2 kg (156 lb 15.5 oz)  Objective: Physical Exam: Filed Vitals:   07/28/15 0400 07/28/15 0500 07/28/15 0800 07/28/15 1000  BP: 166/98  192/125 173/91  Pulse:   106   Temp: 98.4 F (36.9 C)  98 F (36.7 C)   TempSrc: Oral  Oral   Resp: 21  24 29   Height:      Weight:  71.2 kg (156 lb 15.5 oz)    SpO2: 97%   96% 96%     General: Appear in mild distress, no Rash; Oral Mucosa moist. Cardiovascular: S1 and S2 Present, no Murmur, no JVD Respiratory: Bilateral Air entry present and Clear to Auscultation, no Crackles, no wheezes Abdomen: Bowel Sound present, Soft and no tenderness Extremities: bilateral Pedal edema, no calf tenderness Neurology: Grossly no focal neuro deficit.  Data Reviewed: CBC:  Recent Labs Lab 07/27/15 2007 07/27/15 2008 07/28/15 0520  WBC  --  8.5 7.9  HGB 11.2* 8.6* 8.4*  HCT 33.0* 27.0* 25.6*  MCV  --  68.0* 68.3*  PLT  --  455* [id number]*   Basic Metabolic Panel:  Recent Labs Lab 07/27/15 2007 07/27/15 2008 07/28/15 0520  NA 140 139 139  K 4.5 4.7 4.1  CL 104 107 107  CO2  --  22 25  GLUCOSE 174* 175* 121*  BUN 19 17 15   CREATININE 1.50* 1.42* 1.24*  CALCIUM  --  8.9 8.7*   Liver Function Tests: No results for input(s): AST, ALT, ALKPHOS, BILITOT, PROT, ALBUMIN in the last 168 hours. No results for input(s): LIPASE, AMYLASE in the last 168 hours. No results for input(s): AMMONIA in the last 168 hours.  Cardiac Enzymes:  Recent Labs Lab 07/28/15 0030 07/28/15 0520  TROPONINI <0.03 <0.03    BNP (last 3 results)  Recent Labs  04/04/15 0945  BNP 214.8*    CBG:  Recent Labs Lab 07/27/15 2242  GLUCAP 158*    Recent Results (from the past 240 hour(s))  MRSA PCR Screening     Status: None   Collection Time: 07/27/15 11:06 PM  Result Value Ref Range Status   MRSA by PCR NEGATIVE NEGATIVE Final    Comment:        The GeneXpert MRSA Assay (FDA approved for NASAL specimens only), is one component of a comprehensive MRSA colonization surveillance program. It is not intended to diagnose MRSA infection nor to guide or monitor treatment for MRSA infections.      Studies: Ct Head Wo Contrast  07/28/2015  CLINICAL DATA:  Acute onset of constipation and headache. Initial encounter. EXAM: CT HEAD WITHOUT CONTRAST TECHNIQUE: Contiguous  axial images were obtained from the base of the skull through the vertex without intravenous contrast. COMPARISON:  CT of the head performed 06/24/2015 FINDINGS: There is no evidence of acute infarction, mass lesion, or intra- or extra-axial hemorrhage on CT. Prominence of the ventricles and sulci reflects mild to moderate cortical volume loss. Scattered periventricular and subcortical white matter change likely reflects small vessel ischemic microangiopathy. The brainstem and fourth ventricle are within normal limits. The basal ganglia are unremarkable in appearance. The cerebral hemispheres demonstrate grossly normal gray-white differentiation. No mass effect or midline shift is seen. There is no evidence of fracture; visualized osseous structures are unremarkable in appearance. The orbits are within normal limits. The paranasal sinuses and mastoid air cells are well-aerated. No significant soft tissue abnormalities are seen. IMPRESSION: 1. No acute intracranial pathology seen on CT. 2. Mild to moderate cortical volume loss and scattered small vessel ischemic microangiopathy. Electronically Signed  By: Garald Balding M.D.   On: 07/28/2015 02:52   Ct Chest Wo Contrast  07/28/2015  CLINICAL DATA:  Acute onset of constipation and headache. Generalized chest pain. Initial encounter. EXAM: CT CHEST WITHOUT CONTRAST TECHNIQUE: Multidetector CT imaging of the chest was performed following the standard protocol without IV contrast. COMPARISON:  Chest radiograph performed 07/27/2015 FINDINGS: Small right and trace left pleural effusions are noted, with associated atelectasis. No pneumothorax is seen. No masses are identified. Diffuse coronary artery calcifications are seen. Calcification is noted along the thoracic aorta. There is aneurysmal dilatation of the ascending thoracic aorta to 4.6 cm in AP dimension. The great vessels are grossly unremarkable in appearance. No pericardial effusion is identified. Visualized  mediastinal nodes remain normal in size. The thyroid gland is unremarkable in appearance. No axillary lymphadenopathy is seen. The visualized portions of the liver are unremarkable. 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 Scarring is noted at the upper pole of the left kidney. The visualized portions of the pancreas are unremarkable. No acute osseous abnormalities are seen. IMPRESSION: 1. Small right and trace left pleural effusions, with associated atelectasis. 2. Diffuse coronary artery calcifications seen. 3. Aneurysmal dilatation of the ascending thoracic aorta to 4.6 cm in AP dimension. Ascending thoracic aortic aneurysm. Recommend semi-annual imaging followup by CTA or MRA and referral to cardiothoracic surgery if not already obtained. This recommendation follows 2010 ACCF/AHA/AATS/ACR/ASA/SCA/SCAI/SIR/STS/SVM Guidelines for the Diagnosis and Management of Patients With Thoracic Aortic Disease. Circulation. 2010; 121: [id number] 4. Scarring at the upper pole of the left kidney. Electronically Signed   By: Garald Balding M.D.   On: 07/28/2015 02:51   Dg Chest Port 1 View  07/27/2015  CLINICAL DATA:  Chest pain EXAM: PORTABLE CHEST - 1 VIEW COMPARISON:  04/09/2015 FINDINGS: Cardiac shadow is mildly enlarged. Increased density is noted in the right lung base consistent with early infiltrate with associated small effusion. The bony structures are within normal limits. The left lung is clear. IMPRESSION: Mild right basilar infiltrate with associated effusion. Electronically Signed   By: Inez Catalina M.D.   On: 07/27/2015 20:12     Scheduled Meds: . allopurinol  100 mg Oral Daily  . aspirin EC  81 mg Oral Daily  . cloNIDine  0.1 mg Oral BID  . enoxaparin (LOVENOX) injection  30 mg Subcutaneous Q24H  . feeding supplement (ENSURE ENLIVE)  237 mL Oral BID BM  . levothyroxine  50 mcg Oral QAC breakfast  . lisinopril  20 mg Oral BID  . metoprolol  50 mg Oral BID   . pantoprazole sodium  40 mg Oral Daily   Continuous Infusions:  PRN Meds: acetaminophen **OR** acetaminophen, hydrALAZINE, ondansetron **OR** ondansetron (ZOFRAN) IV  Time spent: 30 minutes  Author: Berle Mull, MD [facility] Pager: [phone] 07/28/2015 12:35 PM  If 7PM-7AM, please contact night-coverage at [email or web address], password [facility]

## 2015-07-28 NOTE — Care Management (Signed)
This patient is active with [place]. We are providing RN and PT services. I will follow for discharge needs.  Thank you. Christa See RN BSN [place] [phone]

## 2015-07-28 NOTE — Progress Notes (Signed)
   07/28/15 1054  Clinical Encounter Type  Visited With Patient  Visit Type Spiritual support  Consult/Referral To Nurse  Spiritual Encounters  Spiritual Needs Prayer  Stress Factors  Patient Stress Factors Health changes;Other (Comment)  Patient requested prayer; when I arrived she said she was very anxious about her surgery that was supposed to happen this morning and asked me to pray for her. When I discussed this with her nurse, I was told she was not having surgery but was simply confused. I conveyed to the patient that she would not have surgery this morning. She and I discussed her children and her past and her faith and prayed.

## 2015-07-28 NOTE — Progress Notes (Signed)
ANTICOAGULATION CONSULT NOTE - Initial Consult  Pharmacy Consult for Lovenox Indication: VTE prophylaxis  Allergies  Allergen Reactions  . Iohexol      Code: VOM, Desc: PT REPORTS VOMITING W/ IVP DYE- ARS 12/26/08, Onset Date: [id number]   . Z-Pak [Azithromycin]   . Iodinated Diagnostic Agents Nausea Only    PT HAS HAD SINGULAR INCIDENT OF NAUSEA  W/ IV CONTYRAST INJECTION, NONE IF INJECTED SLOWLY//A.[email or web address]  Patient Measurements: Height: 5\' 8"  (172.7 cm) Weight: 156 lb 15.5 oz (71.2 kg) IBW/kg (Calculated) : 63.9 Vital Signs: Temp: 98.4 F (36.9 C) (01/05 0400) Temp Source: Oral (01/05 0400) BP: 166/98 mmHg (01/05 0400) Pulse Rate: 56 (01/04 2258) Labs:  Recent Labs  07/27/15 2007 07/27/15 2008 07/28/15 0030 07/28/15 0520  HGB 11.2* 8.6*  --  8.4*  HCT 33.0* 27.0*  --  25.6*  PLT  --  455*  --  456*  CREATININE 1.50* 1.42*  --  1.24*  TROPONINI  --   --  <0.03 <0.03   Estimated Creatinine Clearance: 35.3 mL/min (by C-G formula based on Cr of 1.24).   Assessment: 82 year old female with systolic heart failure and atrial fibrillation not on chronic anticoagulation secondary to hemorrhagic stroke (04/04/15) admitted for chest pain and bradycardia. Pharmacy consult to dose Lovenox for DVT prophylaxis.   SCr elevated on admission at 1.5 is now trending down (1.24 today).  CrCl estimated ~ 30-35 mL/min- borderline for dose reduction. UOP - not yet recorded due to recent admission.  H/H low- 8.4/25.6, Plts high at 456 Patient complaining of headache >> Head CT- no acute process.   Goal of Therapy:  Monitor platelets by anticoagulation protocol: Yes   Plan:  Due to recent hemorraghic stroke, low CBC, and borderline renal function in elderly patient, will start Lovenox DVT prophylaxis at low dose of 30 mg SQ daily.  Monitor renal function, CBC, and signs and symptoms of bleeding.  Consider increase to normal dosing if tolerates and studies negative.   Sloan Leiter,  PharmD, BCPS Clinical Pharmacist [phone] 07/28/2015,8:42 AM

## 2015-07-28 NOTE — Progress Notes (Signed)
Initial Nutrition Assessment  DOCUMENTATION CODES:   Not applicable  INTERVENTION:  -RD to continue to monitor for needs -Glucerna Shake po TID, each supplement provides 220 kcal and 10 grams of protein  NUTRITION DIAGNOSIS:   Inadequate oral intake related to poor appetite as evidenced by per patient/family report.  GOAL:   Patient will meet greater than or equal to 90% of their needs  MONITOR:   PO intake, I & O's, Supplement acceptance, Labs, Skin  REASON FOR ASSESSMENT:   Malnutrition Screening Tool    ASSESSMENT:   Nicole [name] is a 82 y.o. female with history of systolic heart failure, atrial fibrillation not on anticoagulation secondary to intracranial hemorrhage, thoracic aortic aneurysm, diabetes mellitus type 2, dyslipidemia, chronic kidney disease presented to the ER because of chest pain. Patient may have just been off and on for last few days. Pain is mostly retrosternal. Nonradiating present even at rest. In addition patient has been having some productive cough. Denies any fever or chills. Chest x-ray shows possible pneumonia. EKG shows bradycardia. On-call cardiologist was consulted and at this time they have requested to hold off rate limiting medications. Patient has been admitted for further management. Denies any abdominal pain nausea vomiting.   Spoke with pt and family at bedside. Family reported pt has been eating well, but doesn't eat "a whole lot." They also provide her glucerna at home. Pt endorses 10# weight loss in a couple weeks. Per chart pt has had a 9#/5.5% moderate wt loss in 1 month. Pt was on the phone with her doctor asking if she could go home during visit. Encouraged pt to eat as much as she could, and to drink glucerna shakes, will help her go home.  Labs and Medications reviewed.  Diet Order:  Diet Heart Room service appropriate?: Yes; Fluid consistency:: Thin  Skin:  Reviewed, no issues  Last BM:  PTA  Height:   Ht Readings  from Last 1 Encounters:  07/27/15 5\' 8"  (1.727 m)    Weight:   Wt Readings from Last 1 Encounters:  07/28/15 156 lb 15.5 oz (71.2 kg)    Ideal Body Weight:  70 kg  BMI:  Body mass index is 23.87 kg/(m^2).  Estimated Nutritional Needs:   Kcal:  1500-1800  Protein:  70-85 grams  Fluid:  >/= 1.5L  EDUCATION NEEDS:   Education needs addressed  Satira Anis. Carel Carrier, MS, RD LDN After Hours/Weekend Pager [phone]

## 2015-07-29 DIAGNOSIS — I5023 Acute on chronic systolic (congestive) heart failure: Secondary | ICD-10-CM

## 2015-07-29 LAB — CBC
HEMATOCRIT: 30.2 % — AB (ref 36.0–46.0)
Hemoglobin: 10.2 g/dL — ABNORMAL LOW (ref 12.0–15.0)
MCH: 22.5 pg — ABNORMAL LOW (ref 26.0–34.0)
MCHC: 33.8 g/dL (ref 30.0–36.0)
MCV: 66.5 fL — ABNORMAL LOW (ref 78.0–100.0)
PLATELETS: 511 10*3/uL — AB (ref 150–400)
RBC: 4.54 MIL/uL (ref 3.87–5.11)
RDW: 19.8 % — AB (ref 11.5–15.5)
WBC: 9 10*3/uL (ref 4.0–10.5)

## 2015-07-29 LAB — BASIC METABOLIC PANEL
Anion gap: 11 (ref 5–15)
BUN: 15 mg/dL (ref 6–20)
CALCIUM: 9 mg/dL (ref 8.9–10.3)
CO2: 27 mmol/L (ref 22–32)
CREATININE: 1.35 mg/dL — AB (ref 0.44–1.00)
Chloride: 103 mmol/L (ref 101–111)
GFR calc Af Amer: 41 mL/min — ABNORMAL LOW (ref >60)
GFR, EST NON AFRICAN AMERICAN: 35 mL/min — AB (ref >60)
Glucose, Bld: 134 mg/dL — ABNORMAL HIGH (ref 65–99)
POTASSIUM: 3.8 mmol/L (ref 3.5–5.1)
SODIUM: 141 mmol/L (ref 135–145)

## 2015-07-29 LAB — LEGIONELLA ANTIGEN, URINE

## 2015-07-29 MED ORDER — HEPARIN SODIUM (PORCINE) 5000 UNIT/ML IJ SOLN
5000.0000 [IU] | Freq: Three times a day (TID) | INTRAMUSCULAR | Status: DC
  Administered 2015-07-30: 5000 [IU] via SUBCUTANEOUS
  Filled 2015-07-29 (×3): qty 1

## 2015-07-29 NOTE — Progress Notes (Signed)
Pt need a glucometer prior to discharge.

## 2015-07-29 NOTE — Progress Notes (Signed)
[facility] Progress Note  Patient: Nicole [name] [id number]   PCP: Hollace Kinnier, DO DOB: 1931-02-05   DOA: 07/27/2015   DOS: 07/29/2015   Date of Service: the patient was seen and examined on 07/29/2015  Subjective: Patient was not oriented this morning. Denies having any acute complaint of chest pain. No abdominal pain. Patient was telling me that she was in the church yesterday with her mother. Discussed with patient's son and patient is at her baseline confused as well. Nutrition: Able to tolerate oral diet Activity: Ambulating in the room Last BM: 07/29/2015  Assessment and Plan: 1. Chest pain Patient presents with on and off chest pain which is ongoing for last 1 month and progressively worsening. Serial troponins are negative. CT chest does not show any acute abnormality. No WMA on TTE, normal EF diastolic dysfunction.   2. Bradycardia, chronic A. fib Accelerated hypertension. Initially on presentation the patient was bradycardic currently her heart rate has improved. We'll resume home medication. Clonidine dose resumed as well. Blood pressure continues to remain elevated will continue titrating her medication in the hospital.  3. Thoracic aneurysm. 4.6 cm on a CT scan. We'll continue monitoring.  4. Questionable pneumonia. CT scan shows basal atelectasis and no evidence of consultation or abdomen bronchogram. We will use incentive spirometry. No need for antibiotic at present.  5. Hypothyroidism. Next on continuing Synthroid.  6. Chronic constipation. Bowel regimen ordered.  7.acute on  Chronic systolic CHF. BNP elevated then baseline and TTE shows EF 55-60% and no wall motion abnormality. No accurate I and O's, weight has reduced significantly as well as swelling resolved. We'll monitor.  8. chronic anemia.  H&H stable. Continuing monitoring.  9. Headache. Acute encephalopathy At her baseline the patient is confused as well. CT of the head is  unremarkable. Will resume aspirin and DVT prophylaxis.  DVT Prophylaxis: subcutaneous Heparin Nutrition: Cardiac diet Advance goals of care discussion: Full code  Brief Summary of Hospitalization:  HPI: As per the H and P dictated on admission, "Nicole [name] is a 82 y.o. female with history of systolic heart failure, atrial fibrillation not on anticoagulation secondary to intracranial hemorrhage, thoracic aortic aneurysm, diabetes mellitus type 2, dyslipidemia, chronic kidney disease presented to the ER because of chest pain. Patient may have just been off and on for last few days. Pain is mostly retrosternal. Nonradiating present even at rest. In addition patient has been having some productive cough. Denies any fever or chills. Chest x-ray shows possible pneumonia. EKG shows bradycardia. On-call cardiologist was consulted and at this time they have requested to hold off rate limiting medications. Patient has been admitted for further management. Denies any abdominal pain nausea vomiting." Daily update, Procedures: Admitted on 07/27/2015, echocardiogram on 07/28/2015 Consultants: Phone consultation with cardiology Antibiotics: Anti-infectives    Start     Dose/Rate Route Frequency Ordered Stop   07/29/15 2000  levofloxacin (LEVAQUIN) IVPB 750 mg  Status:  Discontinued     750 mg 100 mL/hr over 90 Minutes Intravenous Every 48 hours 07/27/15 2343 07/28/15 0808   07/27/15 2100  levofloxacin (LEVAQUIN) IVPB 750 mg     750 mg 100 mL/hr over 90 Minutes Intravenous  Once 07/27/15 2059 07/27/15 2330      Family Communication: Discussed with patient's son on the phone, at the time of interview. All questions answered satisfactorily  Disposition:  Expected discharge date: 07/30/2015 Barriers to safe discharge: Blood pressure management   Intake/Output Summary (Last 24 hours) at 07/29/15 1643  Last data filed at 07/29/15 1300  Gross per 24 hour  Intake   1120 ml  Output      4 ml  Net    1116 ml   Filed Weights   07/28/15 0500 07/29/15 0500 07/29/15 1426  Weight: 71.2 kg (156 lb 15.5 oz) 71.1 kg (156 lb 12 oz) 67.9 kg (149 lb 11.1 oz)    Objective: Physical Exam: Filed Vitals:   07/29/15 0913 07/29/15 1147 07/29/15 1200 07/29/15 1426  BP: 185/94 140/79 131/87 136/79  Pulse: 121 92  90  Temp:  98 F (36.7 C)  98.6 F (37 C)  TempSrc:  Oral  Oral  Resp:    18  Height:      Weight:    67.9 kg (149 lb 11.1 oz)  SpO2:  99% 98% 98%    General: Appear in mild distress, no Rash; Oral Mucosa moist. Cardiovascular: S1 and S2 Present, no Murmur, no JVD Respiratory: Bilateral Air entry present and Clear to Auscultation, no Crackles, no wheezes Abdomen: Bowel Sound present, Soft and no tenderness Extremities: no Pedal edema, no calf tenderness Neurology: Grossly no focal neuro deficit other than disorientation  Data Reviewed: CBC:  Recent Labs Lab 07/27/15 2007 07/27/15 2008 07/28/15 0520 07/29/15 0411  WBC  --  8.5 7.9 9.0  HGB 11.2* 8.6* 8.4* 10.2*  HCT 33.0* 27.0* 25.6* 30.2*  MCV  --  68.0* 68.3* 66.5*  PLT  --  455* 456* [id number]*   Basic Metabolic Panel:  Recent Labs Lab 07/27/15 2007 07/27/15 2008 07/28/15 0520 07/29/15 0411  NA 140 139 139 141  K 4.5 4.7 4.1 3.8  CL 104 107 107 103  CO2  --  22 25 27   GLUCOSE 174* 175* 121* 134*  BUN 19 17 15 15   CREATININE 1.50* 1.42* 1.24* 1.35*  CALCIUM  --  8.9 8.7* 9.0   Cardiac Enzymes:  Recent Labs Lab 07/28/15 0030 07/28/15 0520 07/28/15 1244  TROPONINI <0.03 <0.03 <0.03    BNP (last 3 results)  Recent Labs  04/04/15 0945 07/28/15 1244  BNP 214.8* 1037.7*    CBG:  Recent Labs Lab 07/27/15 2242  GLUCAP 158*    Recent Results (from the past 240 hour(s))  MRSA PCR Screening     Status: None   Collection Time: 07/27/15 11:06 PM  Result Value Ref Range Status   MRSA by PCR NEGATIVE NEGATIVE Final    Comment:        The GeneXpert MRSA Assay (FDA approved for NASAL  specimens only), is one component of a comprehensive MRSA colonization surveillance program. It is not intended to diagnose MRSA infection nor to guide or monitor treatment for MRSA infections.      Studies: No results found.   Scheduled Meds: . allopurinol  100 mg Oral Daily  . aspirin EC  81 mg Oral Daily  . cloNIDine  0.2 mg Oral TID  . feeding supplement (ENSURE ENLIVE)  237 mL Oral BID BM  . feeding supplement (GLUCERNA SHAKE)  237 mL Oral TID BM  . heparin subcutaneous  5,000 Units Subcutaneous 3 times per day  . levothyroxine  50 mcg Oral QAC breakfast  . lisinopril  20 mg Oral BID  . metoprolol  50 mg Oral BID  . pantoprazole sodium  40 mg Oral Daily  . polyethylene glycol  17 g Oral Daily  . senna-docusate  1 tablet Oral BID   Continuous Infusions:  PRN Meds: acetaminophen **OR** acetaminophen, hydrALAZINE, magnesium hydroxide,  ondansetron **OR** ondansetron (ZOFRAN) IV  Time spent: 30 minutes  Author: Berle Mull, MD [facility] Pager: [phone] 07/29/2015 4:43 PM  If 7PM-7AM, please contact night-coverage at [email or web address], password [facility]

## 2015-07-30 DIAGNOSIS — R0789 Other chest pain: Secondary | ICD-10-CM

## 2015-07-30 MED ORDER — FUROSEMIDE 20 MG PO TABS: 20.0000 mg | ORAL_TABLET | Freq: Every day | ORAL | Status: DC | PRN

## 2015-07-30 MED ORDER — BLOOD GLUCOSE METER KIT: PACK | Status: DC

## 2015-07-30 MED ORDER — POTASSIUM CHLORIDE ER 10 MEQ PO TBCR: 20.0000 meq | EXTENDED_RELEASE_TABLET | Freq: Every day | ORAL | Status: DC | PRN

## 2015-07-30 MED ORDER — ENSURE ENLIVE PO LIQD: 237.0000 mL | Freq: Two times a day (BID) | ORAL | Status: DC

## 2015-07-30 MED ORDER — GLUCOSE BLOOD VI STRP: ORAL_STRIP | Status: DC

## 2015-07-30 MED ORDER — FUROSEMIDE 10 MG/ML IJ SOLN
20.0000 mg | Freq: Once | INTRAMUSCULAR | Status: AC
  Administered 2015-07-30: 20 mg via INTRAVENOUS
  Filled 2015-07-30: qty 2

## 2015-07-30 MED ORDER — SENNOSIDES-DOCUSATE SODIUM 8.6-50 MG PO TABS: 1.0000 | ORAL_TABLET | Freq: Two times a day (BID) | ORAL | Status: DC

## 2015-07-30 MED ORDER — FREESTYLE LANCETS MISC: Status: DC

## 2015-07-30 NOTE — Care Management Note (Signed)
Case Management Note  Patient Details  Name: Nicole [name] MRN: [id number] Date of Birth: [birth year]  Subjective/Objective:  82 y.o. F admitted 07/27/2015 was an active pt with [place] prior to admission.                   Action/Plan: Anticipate discharge home today with HHPT provided by [place]. Attempted to speak with the pt by phone, NA. Marland Kitchen No further CM needs but will be available should additional discharge needs arise.   Expected Discharge Date:                  Expected Discharge Plan:  [place]  In-House Referral:     Discharge planning Services  CM Consult  Post Acute Care Choice:  Resumption of Svcs/PTA Provider Choice offered to:     DME Arranged:    DME Agency:  [place]  HH Arranged:  PT, RN [facility] Agency:  [place]  Status of Service:  Completed, signed off  Medicare Important Message Given:    Date Medicare IM Given:    Medicare IM give by:    Date Additional Medicare IM Given:    Additional Medicare Important Message give by:     If discussed at [place] of Stay Meetings, dates discussed:    Additional Comments:  Delrae Sawyers, RN 07/30/2015, 1:26 PM

## 2015-07-30 NOTE — Progress Notes (Signed)
Patient alert oriented x2, denies pain, no shortness of breath, v/s stable. IV and tele d/c, d/c instruction explain and given to the patient son, all questions answered, family verbalized understanding. D/c pt. Home per order.

## 2015-07-30 NOTE — Discharge Instructions (Signed)
Diabetes Mellitus and Food It is important for you to manage your blood sugar (glucose) level. Your blood glucose level can be greatly affected by what you eat. Eating healthier foods in the appropriate amounts throughout the day at about the same time each day will help you control your blood glucose level. It can also help slow or prevent worsening of your diabetes mellitus. Healthy eating may even help you improve the level of your blood pressure and reach or maintain a healthy weight.  General recommendations for healthful eating and cooking habits include:  Eating meals and snacks regularly. Avoid going long periods of time without eating to lose weight.  Eating a diet that consists mainly of plant-based foods, such as fruits, vegetables, nuts, legumes, and whole grains.  Using low-heat cooking methods, such as baking, instead of high-heat cooking methods, such as deep frying. Work with your dietitian to make sure you understand how to use the Nutrition Facts information on food labels. HOW CAN FOOD AFFECT ME? Carbohydrates Carbohydrates affect your blood glucose level more than any other type of food. Your dietitian will help you determine how many carbohydrates to eat at each meal and teach you how to count carbohydrates. Counting carbohydrates is important to keep your blood glucose at a healthy level, especially if you are using insulin or taking certain medicines for diabetes mellitus. Alcohol Alcohol can cause sudden decreases in blood glucose (hypoglycemia), especially if you use insulin or take certain medicines for diabetes mellitus. Hypoglycemia can be a life-threatening condition. Symptoms of hypoglycemia (sleepiness, dizziness, and disorientation) are similar to symptoms of having too much alcohol.  If your health care provider has given you approval to drink alcohol, do so in moderation and use the following guidelines:  Women should not have more than one drink per day, and men  should not have more than two drinks per day. One drink is equal to:  12 oz of beer.  5 oz of wine.  1 oz of hard liquor.  Do not drink on an empty stomach.  Keep yourself hydrated. Have water, diet soda, or unsweetened iced tea.  Regular soda, juice, and other mixers might contain a lot of carbohydrates and should be counted. WHAT FOODS ARE NOT RECOMMENDED? As you make food choices, it is important to remember that all foods are not the same. Some foods have fewer nutrients per serving than other foods, even though they might have the same number of calories or carbohydrates. It is difficult to get your body what it needs when you eat foods with fewer nutrients. Examples of foods that you should avoid that are high in calories and carbohydrates but low in nutrients include:  Trans fats (most processed foods list trans fats on the Nutrition Facts label).  Regular soda.  Juice.  Candy.  Sweets, such as cake, pie, doughnuts, and cookies.  Fried foods. WHAT FOODS CAN I EAT? Eat nutrient-rich foods, which will nourish your body and keep you healthy. The food you should eat also will depend on several factors, including:  The calories you need.  The medicines you take.  Your weight.  Your blood glucose level.  Your blood pressure level.  Your cholesterol level. You should eat a variety of foods, including:  Protein.  Lean cuts of meat.  Proteins low in saturated fats, such as fish, egg whites, and beans. Avoid processed meats.  Fruits and vegetables.  Fruits and vegetables that may help control blood glucose levels, such as apples, mangoes, and   yams.  Dairy products.  Choose fat-free or low-fat dairy products, such as milk, yogurt, and cheese.  Grains, bread, pasta, and rice.  Choose whole grain products, such as multigrain bread, whole oats, and brown rice. These foods may help control blood pressure.  Fats.  Foods containing healthful fats, such as nuts,  avocado, olive oil, canola oil, and fish. DOES EVERYONE WITH DIABETES MELLITUS HAVE THE SAME MEAL PLAN? Because every person with diabetes mellitus is different, there is not one meal plan that works for everyone. It is very important that you meet with a dietitian who will help you create a meal plan that is just right for you.   This information is not intended to replace advice given to you by your health care provider. Make sure you discuss any questions you have with your health care provider.   Document Released: 04/05/2005 Document Revised: 07/30/2014 Document Reviewed: 06/05/2013 Elsevier Interactive Patient Education 2016 [employer]  

## 2015-07-30 NOTE — Care Management Note (Signed)
Case Management Note  Patient Details  Name: Nicole [name] MRN: [id number] Date of Birth: 1931/01/13  Subjective/Objective:                  Chest pain  Action/Plan: CM attempted to speak with patient. Spoke with patient's nurse. Patient is active with [place]. Amy and Tommi Emery with [place] notified of discharge scheduled for today.   Expected Discharge Date:   07/30/15             Expected Discharge Plan:  [place]  In-House Referral:     Discharge planning Services  CM Consult  Post Acute Care Choice:  Resumption of Svcs/PTA Provider Choice offered to:     DME Arranged:    DME Agency:  [place]  HH Arranged:  PT, RN [facility] Agency:  [place]  Status of Service:  Completed, signed off  Medicare Important Message Given:    Date Medicare IM Given:    Medicare IM give by:    Date Additional Medicare IM Given:    Additional Medicare Important Message give by:     If discussed at [place] of Stay Meetings, dates discussed:    Additional Comments:  Apolonio Schneiders, RN 07/30/2015, 1:26 PM

## 2015-07-30 NOTE — Evaluation (Signed)
Physical Therapy Evaluation Patient Details Name: Nicole [name] MRN: [id number] DOB: 1931-07-17 Today's Date: 07/30/2015   History of Present Illness  Nicole [name] is a 82 y.o. female with history of systolic heart failure, atrial fibrillation not on anticoagulation secondary to intracranial hemorrhage, thoracic aortic aneurysm, diabetes mellitus type 2, dyslipidemia, chronic kidney disease presented to the ER because of chest pain. Admitted with questionable pneumonia.  Clinical Impression  Pt admitted with the above copmlications. Pt currently with functional limitations due to the deficits listed below (see PT Problem List). Demonstrates slight instability with gait but states she has access to a rolling walker at home which I suggest she use. HR did quickly elevate to 138 with short distance, slow ambulation. Denies chest pain but reports she feels her heart beating quickly. Reported a different history of home situation from what RN reports. Pt apparently lives with her brother who provides supervision. 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 Pt will benefit from skilled PT to increase their independence and safety with mobility to allow discharge to the venue listed below.       Follow Up Recommendations Home health PT;Supervision/Assistance - 24 hour    Equipment Recommendations  None recommended by PT - use rolling walker at home   Recommendations for Other Services       Precautions / Restrictions Precautions Precautions: Fall Restrictions Weight Bearing Restrictions: No      Mobility  Bed Mobility Overal bed mobility: Modified Independent             General bed mobility comments: extra time  Transfers Overall transfer level: Needs assistance Equipment used: None Transfers: Sit to/from Stand Sit to Stand: Supervision         General transfer comment: supervision for safety. min sway noted but  able to self correct without reaching for furniture.  Ambulation/Gait Ambulation/Gait assistance: Supervision Ambulation Distance (Feet): 70 Feet Assistive device: None Gait Pattern/deviations: Step-through pattern;Decreased stride length;Shuffle;Narrow base of support Gait velocity: decreased Gait velocity interpretation: Below normal speed for age/gender General Gait Details: Slower and guarded with slight instability noted. VC for foot clearance due to intermittent shuffling. No overt loss of balance requiring physical assist. HR elevated to 138, improves quickly to 100 upon sitting. No dyspnea.  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 Son & daughter live near) Type of Home: House Home Access: Stairs to enter Entrance Stairs-Rails: Right Entrance Stairs-Number of Steps: 2 Home Layout: One level Home Equipment: Walker - 2 wheels (3 wheel walker)      Prior Function Level of Independence: Independent               Hand Dominance   Dominant Hand: Left    Extremity/Trunk Assessment   Upper  Extremity Assessment: Defer to OT evaluation           Lower Extremity Assessment: Generalized weakness         Communication   Communication: No difficulties  Cognition Arousal/Alertness: Awake/alert Behavior During Therapy: WFL for tasks assessed/performed Overall  Cognitive Status: No family/caregiver present to determine baseline cognitive functioning                 General Comments: Pt reports that she lives alone and takes care of a 80 y.o. - RN reports this is not true, pt lives with her brother at home.    General Comments      Exercises        Assessment/Plan    PT Assessment Patient needs continued PT services  PT Diagnosis Difficulty walking;Abnormality of gait;Generalized weakness   PT Problem List Decreased strength;Decreased activity tolerance;Decreased balance;Decreased mobility;Decreased cognition;Decreased knowledge of use of DME;Cardiopulmonary status limiting activity  PT Treatment Interventions DME instruction;Gait training;Stair training;Functional mobility training;Therapeutic activities;Balance training;Therapeutic exercise;Neuromuscular re-education;Patient/family education   PT Goals (Current goals can be found in the Care Plan section) Acute Rehab PT Goals Patient Stated Goal: Go home PT Goal Formulation: With patient Time For Goal Achievement: 08/13/15 Potential to Achieve Goals: Good    Frequency Min 3X/week   Barriers to discharge        Co-evaluation               End of Session Equipment Utilized During Treatment: Gait belt Activity Tolerance: Patient tolerated treatment well Patient left: in chair;with call bell/phone within reach;with nursing/sitter in room Nurse Communication:  (could not reach via telephone)         Time: [id number] PT Time Calculation (min) (ACUTE ONLY): 12 min   Charges:   PT Evaluation $PT Eval Low Complexity: 1 Procedure     PT G CodesEllouise Newer 07/30/2015, 10:25 AM Elayne Snare, [place]

## 2015-08-02 DIAGNOSIS — I5023 Acute on chronic systolic (congestive) heart failure: Secondary | ICD-10-CM | POA: Insufficient documentation

## 2015-08-02 DIAGNOSIS — I5031 Acute diastolic (congestive) heart failure: Secondary | ICD-10-CM | POA: Diagnosis present

## 2015-08-02 NOTE — Discharge Summary (Addendum)
[facility] Discharge Summary   Patient: Nicole [name] YPP:[id number]   PCP: Hollace Kinnier, DO DOB: [birth year]   Date of admission: 07/27/2015   Date of discharge: 07/30/2015    Discharge Diagnoses:  Principal Problem:   Chest pain Active Problems:   Thoracic aortic aneurysm, 4.8cm 2011   CHF, past NICM with an EF of 30%, most recent echo 2011 showed EF to be45- 50%   Hypothyroidism   Chronic constipation   Chronic atrial fibrillation (HCC)   Bradycardia   Chronic anemia  Recommendations for Outpatient Follow-up:  1. Follow up with PCP to get BMP recheck  Follow-up Information    Follow up with [facility]   Why:  They will continue to do your home health care at your home. HHPT and HHRN   Contact information:   [place] [address] [phone]       Follow up with [name], [name], DO In 1 week.   Specialty:  Geriatric Medicine   Why:  with BMP and follow up n hypertension and atrial fibrillation.   Contact information:   [address] [place] [address] [phone]      Diet recommendation: cardiac diet  Activity: The patient is advised to gradually reintroduce usual activities.  Discharge Condition: good  History of present illness: As per the H and P dictated on admission, "Nicole [name] is a 82 y.o. female with history of systolic heart failure, atrial fibrillation not on anticoagulation secondary to intracranial hemorrhage, thoracic aortic aneurysm, diabetes mellitus type 2, dyslipidemia, chronic kidney disease presented to the ER because of chest pain. Patient may have just been off and on for last few days. Pain is mostly retrosternal. Nonradiating present even at rest. In addition patient has been having some productive cough. Denies any fever or chills. Chest x-ray shows possible pneumonia. EKG shows bradycardia. On-call cardiologist was consulted and at this time they have requested to hold off rate limiting  medications. Patient has been admitted for further management. Denies any abdominal pain nausea vomiting. "  Hospital Course:  Summary of her active problems in the hospital is as following.  1. acute onChronic diastolic CHF, Chest pain  Patient presents with on and off chest pain which is ongoing for last 1 month and progressively worsening. Pain was atypical and not exertional. Patient never had any reoccurrence of chest pain during her hospitalization. Serial troponins are negative. CT chest does not show any acute abnormality. Likely from CHF. No WMA on TTE, normal EF, diastolic dysfunction.  Discharge and 81 mg aspirin. Patient will require outpatient cardiology referral if she has recurrence of her chest pain.  BNP elevated then baseline and TTE shows EF 55-60% and no wall motion abnormality. Patient was given IV Lasix in the hospital and will be discharged on oral Lasix as needed to be taken for weight gain as well as pedal edema.  2. Bradycardia, chronic A. fib Accelerated hypertension. Initially on presentation the patient was bradycardic all her medications were on hold. currently her heart rate has improved. Metoprolol, clonidine, lisinopril resumed at home doses. Cardizem was kept on hold. At discharge Cardizem will be discontinued.  3. Thoracic aneurysm. 4.6 cm on a CT scan. Continue outpatient annual monitoring.  4. Basal atelectasis, no evidence of pneumonia CT scan shows basal atelectasis and no evidence of consultation or abdomen bronchogram. We will use incentive spirometry. No need for antibiotic was prescribed in the hospital.  5. Hypothyroidism.  continuing  Synthroid.  6. Chronic constipation. Bowel regimen ordered.  7. chronic anemia.  H&H stable. Continuing monitoring.  8. Headache. Acute encephalopathy Probable vascular dementia. At her baseline the patient is confused as well as per patient's son. CT of the head is unremarkable. Will resume aspirin.    9. Type 2 diabetes mellitus. Hemoglobin A1c 7.6. Prior hemoglobin A1c 5.9. Patient recommended to continue monitoring her sugar and maintain a log after sugar levels and follow-up with PCP regarding treatment for type 2 diabetes mellitus. Blood sugar level in the hospital ranged between 120-170 therefore no oral hypoglycemic agents were provided on discharge.   All other chronic medical condition were stable during the hospitalization.  Patient was seen by physical therapy, who recommended home health, which was arranged by Education officer, museum and case Freight forwarder. On the day of the discharge the patient's vitals were stable, and no other acute medical condition were reported by patient. the patient was felt safe to be discharge at home with home health.  Procedures and Results:  Echocardiogram- 52-84% diastolic dysfunction with severe pulmonary hypertension and moderate TR  Consultations:  None  DISCHARGE MEDICATION: Discharge Medication List as of 07/30/2015  2:52 PM    START taking these medications   Details  blood glucose meter kit and supplies Dispense based on patient and insurance preference. Use up to four times daily as directed. (FOR ICD-9 250.00, 250.01)., Print    feeding supplement, ENSURE ENLIVE, (ENSURE ENLIVE) LIQD Take 237 mLs by mouth 2 (two) times daily between meals., Starting 07/30/2015, Until Discontinued, Normal    furosemide (LASIX) 20 MG tablet Take 1 tablet (20 mg total) by mouth daily as needed (take for more than 2 Lb weight gain in a day or pedal edema along with potassium)., Starting 07/30/2015, Until Discontinued, Normal    glucose blood (FREESTYLE TEST STRIPS) test strip Use as instructed, Normal    !! Lancets (FREESTYLE) lancets Use as instructed, Normal    potassium chloride (K-DUR) 10 MEQ tablet Take 2 tablets (20 mEq total) by mouth daily as needed (take with lasix only.)., Starting 07/30/2015, Until Discontinued, Normal    senna-docusate (SENOKOT-S) 8.6-50 MG  tablet Take 1 tablet by mouth 2 (two) times daily., Starting 07/30/2015, Until Discontinued, Normal     !! - Potential duplicate medications found. Please discuss with provider.    CONTINUE these medications which have NOT CHANGED   Details  allopurinol (ZYLOPRIM) 100 MG tablet Take 100 mg by mouth daily. , Starting 05/05/2015, Until Discontinued, Historical Med    aspirin EC 81 MG tablet Take 81 mg by mouth daily., Until Discontinued, Historical Med    cloNIDine (CATAPRES) 0.2 MG tablet Take one tablet by mouth three times daily for hypertension, Normal    donepezil (ARICEPT) 5 MG tablet Take 1 tablet (5 mg total) by mouth at bedtime. Start one tablet daily x 4 weeks then two tablets daily, Starting 06/13/2015, Until Discontinued, Normal    levothyroxine (SYNTHROID, LEVOTHROID) 50 MCG tablet Take one tablet by mouth 30 minutes before breakfast away from other medications for thyroid, Historical Med    lisinopril (PRINIVIL,ZESTRIL) 20 MG tablet Take one tablet by mouth twice daily for hypertension, Historical Med    Melatonin-Pyridoxine (MELATIN PO) Take 1 tablet by mouth at bedtime as needed. For sleep, Until Discontinued, Historical Med    metoprolol (LOPRESSOR) 50 MG tablet Take 1 tablet (50 mg total) by mouth 2 (two) times daily., Starting 07/19/2015, Until Discontinued, Normal    pantoprazole sodium (PROTONIX) 40 mg/20  mL PACK Take 40 mg by mouth daily. , Until Discontinued, Historical Med    Blood Glucose Calibration (ACCU-CHEK AVIVA) SOLN See admin instructions., Starting 05/30/2015, Until Discontinued, Historical Med    !! EASY COMFORT LANCETS MISC 3 (three) times daily. for testing, Starting 05/30/2015, Until Discontinued, Historical Med     !! - Potential duplicate medications found. Please discuss with provider.    STOP taking these medications     diltiazem (CARDIZEM) 60 MG tablet      Potassium Gluconate 2.5 MEQ TABS      zoster vaccine live, PF, (ZOSTAVAX) 58099  UNT/0.65ML injection        Allergies  Allergen Reactions  . Iohexol      Code: VOM, Desc: PT REPORTS VOMITING W/ IVP DYE- ARS 12/26/08, Onset Date: [id number]   . Z-Pak [Azithromycin]   . Iodinated Diagnostic Agents Nausea Only    PT HAS HAD SINGULAR INCIDENT OF NAUSEA  W/ IV CONTYRAST INJECTION, NONE IF INJECTED SLOWLY//A.CALHOUN   Discharge Instructions    Diet - low sodium heart healthy    Complete by:  As directed      Diet - low sodium heart healthy    Complete by:  As directed      Diet Carb Modified    Complete by:  As directed      Discharge instructions    Complete by:  As directed   It is important that you read following instructions as well as go over your medication list with RN to help you understand your care after this hospitalization.  Discharge Instructions: For Heart failure patients -  Check your Weight same time everyday If you gain over 2 pounds, or you develop in leg swelling, experience more shortness of breath or chest pain, call your Primary MD immediately.  Avoid adding extra salt in the food, food high in salt and Follow 1.5 lit/day fluid restriction.    Please request your primary care physician to go over all Hospital Tests and Procedure/Radiological results at the follow up,  Please get all Hospital records sent to your PCP by signing hospital release before you go home.   You were cared for by a hospitalist during your hospital stay. If you have any questions about your discharge medications or the care you received while you were in the hospital after you are discharged, you can call the unit and ask to speak with the hospitalist on call if the hospitalist that took care of you is not available.  Once you are discharged, your primary care physician will handle any further medical issues. Please note that NO REFILLS for any discharge medications will be authorized once you are discharged, as it is imperative that you return to your primary care physician  (or establish a relationship with a primary care physician if you do not have one) for your aftercare needs so that they can reassess your need for medications and monitor your lab values. You Must read complete instructions/literature along with all the possible adverse reactions/side effects for all the Medicines you take and that have been prescribed to you. Take any new Medicines after you have completely understood and accept all the possible adverse reactions/side effects. Wear Seat belts while driving.     Discharge instructions    Complete by:  As directed   Check glucose level 3 times a day before breakfast and meal, maintain the log and follow-up with PCP with the level     Increase activity slowly  Complete by:  As directed           Discharge Exam: Filed Weights   07/29/15 0500 07/29/15 1426 07/30/15 0346  Weight: 71.1 kg (156 lb 12 oz) 67.9 kg (149 lb 11.1 oz) 67.7 kg (149 lb 4 oz)   Filed Vitals:   07/30/15 1123 07/30/15 1248  BP: 140/74 130/87  Pulse: 88 97  Temp:  98.1 F (36.7 C)  Resp: 20 20   General: Appear in no distress, no Rash; Oral Mucosa moist. Cardiovascular: S1 and S2 Present, no Murmur, no JVD Respiratory: Bilateral Air entry present and Clear to Auscultation, no Crackles, no wheezes Abdomen: Bowel Sound present, Soft and no tenderness Extremities: no Pedal edema, no calf tenderness Neurology: Grossly no focal neuro deficit.  The results of significant diagnostics from this hospitalization (including imaging, microbiology, ancillary and laboratory) are listed below for reference.    Significant Diagnostic Studies: Ct Head Wo Contrast  07/28/2015  CLINICAL DATA:  Acute onset of constipation and headache. Initial encounter. EXAM: CT HEAD WITHOUT CONTRAST TECHNIQUE: Contiguous axial images were obtained from the base of the skull through the vertex without intravenous contrast. COMPARISON:  CT of the head performed 06/24/2015 FINDINGS: There is no  evidence of acute infarction, mass lesion, or intra- or extra-axial hemorrhage on CT. Prominence of the ventricles and sulci reflects mild to moderate cortical volume loss. Scattered periventricular and subcortical white matter change likely reflects small vessel ischemic microangiopathy. The brainstem and fourth ventricle are within normal limits. The basal ganglia are unremarkable in appearance. The cerebral hemispheres demonstrate grossly normal gray-white differentiation. No mass effect or midline shift is seen. There is no evidence of fracture; visualized osseous structures are unremarkable in appearance. The orbits are within normal limits. The paranasal sinuses and mastoid air cells are well-aerated. No significant soft tissue abnormalities are seen. IMPRESSION: 1. No acute intracranial pathology seen on CT. 2. Mild to moderate cortical volume loss and scattered small vessel ischemic microangiopathy. Electronically Signed   By: Garald Balding M.D.   On: 07/28/2015 02:52   Ct Chest Wo Contrast  07/28/2015  CLINICAL DATA:  Acute onset of constipation and headache. Generalized chest pain. Initial encounter. EXAM: CT CHEST WITHOUT CONTRAST TECHNIQUE: Multidetector CT imaging of the chest was performed following the standard protocol without IV contrast. COMPARISON:  Chest radiograph performed 07/27/2015 FINDINGS: Small right and trace left pleural effusions are noted, with associated atelectasis. No pneumothorax is seen. No masses are identified. Diffuse coronary artery calcifications are seen. Calcification is noted along the thoracic aorta. There is aneurysmal dilatation of the ascending thoracic aorta to 4.6 cm in AP dimension. The great vessels are grossly unremarkable in appearance. No pericardial effusion is identified. Visualized mediastinal nodes remain normal in size. The thyroid gland is unremarkable in appearance. No axillary lymphadenopathy is seen. The visualized portions of the liver are  unremarkable. 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 Scarring is noted at the upper pole of the left kidney. The visualized portions of the pancreas are unremarkable. No acute osseous abnormalities are seen. IMPRESSION: 1. Small right and trace left pleural effusions, with associated atelectasis. 2. Diffuse coronary artery calcifications seen. 3. Aneurysmal dilatation of the ascending thoracic aorta to 4.6 cm in AP dimension. Ascending thoracic aortic aneurysm. Recommend semi-annual imaging followup by CTA or MRA and referral to cardiothoracic surgery if not already obtained. This recommendation follows 2010 ACCF/AHA/AATS/ACR/ASA/SCA/SCAI/SIR/STS/SVM Guidelines for the Diagnosis and Management of Patients With Thoracic  Aortic Disease. Circulation. 2010; 121: V779-T903 4. Scarring at the upper pole of the left kidney. Electronically Signed   By: Garald Balding M.D.   On: 07/28/2015 02:51   Dg Chest Port 1 View  07/27/2015  CLINICAL DATA:  Chest pain EXAM: PORTABLE CHEST - 1 VIEW COMPARISON:  04/09/2015 FINDINGS: Cardiac shadow is mildly enlarged. Increased density is noted in the right lung base consistent with early infiltrate with associated small effusion. The bony structures are within normal limits. The left lung is clear. IMPRESSION: Mild right basilar infiltrate with associated effusion. Electronically Signed   By: Inez Catalina M.D.   On: 07/27/2015 20:12    Microbiology: Recent Results (from the past 240 hour(s))  MRSA PCR Screening     Status: None   Collection Time: 07/27/15 11:06 PM  Result Value Ref Range Status   MRSA by PCR NEGATIVE NEGATIVE Final    Comment:        The GeneXpert MRSA Assay (FDA approved for NASAL specimens only), is one component of a comprehensive MRSA colonization surveillance program. It is not intended to diagnose MRSA infection nor to guide or monitor treatment for MRSA infections.    Labs: CBC:  Recent  Labs Lab 07/27/15 2007 07/27/15 2008 07/28/15 0520 07/29/15 0411  WBC  --  8.5 7.9 9.0  HGB 11.2* 8.6* 8.4* 10.2*  HCT 33.0* 27.0* 25.6* 30.2*  MCV  --  68.0* 68.3* 66.5*  PLT  --  455* 456* 009*   Basic Metabolic Panel:  Recent Labs Lab 07/27/15 2007 07/27/15 2008 07/28/15 0520 07/29/15 0411  NA 140 139 139 141  K 4.5 4.7 4.1 3.8  CL 104 107 107 103  CO2  --  _0 GLUCOSE 174* 175* 121* 134*  BUN _1 CREATININE 1.50* 1.42* 1.24* 1.35*  CALCIUM  --  8.9 8.7* 9.0   Cardiac Enzymes:  Recent Labs Lab 07/28/15 0030 07/28/15 0520 07/28/15 1244  TROPONINI <0.03 <0.03 <0.03   BNP (last 3 results)  Recent Labs  04/04/15 0945 07/28/15 1244  BNP 214.8* 1037.7*   CBG:  Recent Labs Lab 07/27/15 2242  GLUCAP 158*   Time spent: 30 minutes  Signed:  Lamarius Dirr  [facility] 07/30/2015, 1:55 PM

## 2015-08-03 DIAGNOSIS — R040 Epistaxis: Secondary | ICD-10-CM | POA: Diagnosis not present

## 2015-08-04 DIAGNOSIS — E1121 Type 2 diabetes mellitus with diabetic nephropathy: Secondary | ICD-10-CM | POA: Diagnosis not present

## 2015-08-04 DIAGNOSIS — I130 Hypertensive heart and chronic kidney disease with heart failure and stage 1 through stage 4 chronic kidney disease, or unspecified chronic kidney disease: Secondary | ICD-10-CM | POA: Diagnosis not present

## 2015-08-04 DIAGNOSIS — E1122 Type 2 diabetes mellitus with diabetic chronic kidney disease: Secondary | ICD-10-CM | POA: Diagnosis not present

## 2015-08-04 DIAGNOSIS — F0150 Vascular dementia without behavioral disturbance: Secondary | ICD-10-CM | POA: Diagnosis not present

## 2015-08-05 DIAGNOSIS — F0150 Vascular dementia without behavioral disturbance: Secondary | ICD-10-CM | POA: Diagnosis not present

## 2015-08-05 DIAGNOSIS — I130 Hypertensive heart and chronic kidney disease with heart failure and stage 1 through stage 4 chronic kidney disease, or unspecified chronic kidney disease: Secondary | ICD-10-CM | POA: Diagnosis not present

## 2015-08-05 DIAGNOSIS — E1121 Type 2 diabetes mellitus with diabetic nephropathy: Secondary | ICD-10-CM | POA: Diagnosis not present

## 2015-08-05 DIAGNOSIS — E1122 Type 2 diabetes mellitus with diabetic chronic kidney disease: Secondary | ICD-10-CM | POA: Diagnosis not present

## 2015-08-09 DIAGNOSIS — I130 Hypertensive heart and chronic kidney disease with heart failure and stage 1 through stage 4 chronic kidney disease, or unspecified chronic kidney disease: Secondary | ICD-10-CM | POA: Diagnosis not present

## 2015-08-09 DIAGNOSIS — F0150 Vascular dementia without behavioral disturbance: Secondary | ICD-10-CM | POA: Diagnosis not present

## 2015-08-09 DIAGNOSIS — E1121 Type 2 diabetes mellitus with diabetic nephropathy: Secondary | ICD-10-CM | POA: Diagnosis not present

## 2015-08-09 DIAGNOSIS — E1122 Type 2 diabetes mellitus with diabetic chronic kidney disease: Secondary | ICD-10-CM | POA: Diagnosis not present

## 2015-08-11 ENCOUNTER — Telehealth: Payer: Self-pay | Admitting: *Deleted

## 2015-08-11 DIAGNOSIS — F0150 Vascular dementia without behavioral disturbance: Secondary | ICD-10-CM | POA: Diagnosis not present

## 2015-08-11 DIAGNOSIS — E1122 Type 2 diabetes mellitus with diabetic chronic kidney disease: Secondary | ICD-10-CM | POA: Diagnosis not present

## 2015-08-11 DIAGNOSIS — E1121 Type 2 diabetes mellitus with diabetic nephropathy: Secondary | ICD-10-CM | POA: Diagnosis not present

## 2015-08-11 DIAGNOSIS — I130 Hypertensive heart and chronic kidney disease with heart failure and stage 1 through stage 4 chronic kidney disease, or unspecified chronic kidney disease: Secondary | ICD-10-CM | POA: Diagnosis not present

## 2015-08-11 NOTE — Telephone Encounter (Signed)
Nicole [name] with Nicole [name] called and stated that she was in patient's home and BP was 175/100 and with breathing exercise it was 171/106, Pulse 96 and Blood Sugar 228 and up 1 pound since yesterday. Home Health nurse was just calling due to it being out of Perimeters. Please Advise.

## 2015-08-11 NOTE — Telephone Encounter (Signed)
Had patient taken her medications?  If so, please check bp at each visit and send readings.

## 2015-08-12 NOTE — Telephone Encounter (Signed)
LMOM to return call.

## 2015-08-15 ENCOUNTER — Ambulatory Visit (INDEPENDENT_AMBULATORY_CARE_PROVIDER_SITE_OTHER): Payer: Medicare Other | Admitting: Internal Medicine

## 2015-08-15 ENCOUNTER — Encounter: Payer: Self-pay | Admitting: Internal Medicine

## 2015-08-15 ENCOUNTER — Other Ambulatory Visit: Payer: Self-pay | Admitting: *Deleted

## 2015-08-15 VITALS — BP 120/88 | HR 57 | Temp 97.4°F | Resp 20 | Ht 68.0 in | Wt 155.4 lb

## 2015-08-15 DIAGNOSIS — E038 Other specified hypothyroidism: Secondary | ICD-10-CM

## 2015-08-15 DIAGNOSIS — E034 Atrophy of thyroid (acquired): Secondary | ICD-10-CM

## 2015-08-15 DIAGNOSIS — R0789 Other chest pain: Secondary | ICD-10-CM

## 2015-08-15 DIAGNOSIS — I5032 Chronic diastolic (congestive) heart failure: Secondary | ICD-10-CM | POA: Diagnosis not present

## 2015-08-15 DIAGNOSIS — R0781 Pleurodynia: Secondary | ICD-10-CM | POA: Diagnosis not present

## 2015-08-15 DIAGNOSIS — F0150 Vascular dementia without behavioral disturbance: Secondary | ICD-10-CM | POA: Diagnosis not present

## 2015-08-15 DIAGNOSIS — E1121 Type 2 diabetes mellitus with diabetic nephropathy: Secondary | ICD-10-CM

## 2015-08-15 DIAGNOSIS — I482 Chronic atrial fibrillation, unspecified: Secondary | ICD-10-CM

## 2015-08-15 DIAGNOSIS — I10 Essential (primary) hypertension: Secondary | ICD-10-CM | POA: Diagnosis not present

## 2015-08-15 MED ORDER — FREESTYLE LANCETS MISC: Status: DC

## 2015-08-15 MED ORDER — GLUCOSE BLOOD VI STRP: ORAL_STRIP | Status: DC

## 2015-08-15 MED ORDER — ALLOPURINOL 100 MG PO TABS: 100.0000 mg | ORAL_TABLET | Freq: Every day | ORAL | Status: DC

## 2015-08-15 NOTE — Telephone Encounter (Signed)
[facility] 

## 2015-08-15 NOTE — Patient Instructions (Addendum)
Use a warm compress or heating pad on your right side where the muscle pain is three times a day for 20 minute intervals.  DASH Eating Plan DASH stands for "Dietary Approaches to Stop Hypertension." The DASH eating plan is a healthy eating plan that has been shown to reduce high blood pressure (hypertension). Additional health benefits may include reducing the risk of type 2 diabetes mellitus, heart disease, and stroke. The DASH eating plan may also help with weight loss. WHAT DO I NEED TO KNOW ABOUT THE DASH EATING PLAN? For the DASH eating plan, you will follow these general guidelines:  Choose foods with a percent daily value for sodium of less than 5% (as listed on the food label).  Use salt-free seasonings or herbs instead of table salt or sea salt.  Check with your health care provider or pharmacist before using salt substitutes.  Eat lower-sodium products, often labeled as "lower sodium" or "no salt added."  Eat fresh foods.  Eat more vegetables, fruits, and low-fat dairy products.  Choose whole grains. Look for the word "whole" as the first word in the ingredient list.  Choose fish and skinless chicken or [place] more often than red meat. Limit fish, poultry, and meat to 6 oz (170 g) each day.  Limit sweets, desserts, sugars, and sugary drinks.  Choose heart-healthy fats.  Limit cheese to 1 oz (28 g) per day.  Eat more home-cooked food and less restaurant, buffet, and fast food.  Limit fried foods.  Cook foods using methods other than frying.  Limit canned vegetables. If you do use them, rinse them well to decrease the sodium.  When eating at a restaurant, ask that your food be prepared with less salt, or no salt if possible. WHAT FOODS CAN I EAT? Seek help from a dietitian for individual calorie needs. Grains Whole grain or whole wheat bread. Brown rice. Whole grain or whole wheat pasta. Quinoa, bulgur, and whole grain cereals. Low-sodium cereals. Corn or whole wheat  flour tortillas. Whole grain cornbread. Whole grain crackers. Low-sodium crackers. Vegetables Fresh or frozen vegetables (raw, steamed, roasted, or grilled). Low-sodium or reduced-sodium tomato and vegetable juices. Low-sodium or reduced-sodium tomato sauce and paste. Low-sodium or reduced-sodium canned vegetables.  Fruits All fresh, canned (in natural juice), or frozen fruits. Meat and Other Protein Products Ground beef (85% or leaner), grass-fed beef, or beef trimmed of fat. Skinless chicken or [place]. Ground chicken or [place]. Pork trimmed of fat. All fish and seafood. Eggs. Dried beans, peas, or lentils. Unsalted nuts and seeds. Unsalted canned beans. Dairy Low-fat dairy products, such as skim or 1% milk, 2% or reduced-fat cheeses, low-fat ricotta or cottage cheese, or plain low-fat yogurt. Low-sodium or reduced-sodium cheeses. Fats and Oils Tub margarines without trans fats. Light or reduced-fat mayonnaise and salad dressings (reduced sodium). Avocado. Safflower, olive, or canola oils. Natural peanut or almond butter. Other Unsalted popcorn and pretzels. The items listed above may not be a complete list of recommended foods or beverages. Contact your dietitian for more options. WHAT FOODS ARE NOT RECOMMENDED? Grains White bread. White pasta. White rice. Refined cornbread. Bagels and croissants. Crackers that contain trans fat. Vegetables Creamed or fried vegetables. Vegetables in a cheese sauce. Regular canned vegetables. Regular canned tomato sauce and paste. Regular tomato and vegetable juices. Fruits Dried fruits. Canned fruit in light or heavy syrup. Fruit juice. Meat and Other Protein Products Fatty cuts of meat. Ribs, chicken wings, bacon, sausage, bologna, salami, chitterlings, fatback, hot dogs, bratwurst, and packaged  luncheon meats. Salted nuts and seeds. Canned beans with salt. Dairy Whole or 2% milk, cream, half-and-half, and cream cheese. Whole-fat or sweetened yogurt.  Full-fat cheeses or blue cheese. Nondairy creamers and whipped toppings. Processed cheese, cheese spreads, or cheese curds. Condiments Onion and garlic salt, seasoned salt, table salt, and sea salt. Canned and packaged gravies. Worcestershire sauce. Tartar sauce. Barbecue sauce. Teriyaki sauce. Soy sauce, including reduced sodium. Steak sauce. Fish sauce. Oyster sauce. Cocktail sauce. Horseradish. Ketchup and mustard. Meat flavorings and tenderizers. Bouillon cubes. Hot sauce. Tabasco sauce. Marinades. Taco seasonings. Relishes. Fats and Oils Butter, stick margarine, lard, shortening, ghee, and bacon fat. Coconut, palm kernel, or palm oils. Regular salad dressings. Other Pickles and olives. Salted popcorn and pretzels. The items listed above may not be a complete list of foods and beverages to avoid. Contact your dietitian for more information. WHERE CAN I FIND MORE INFORMATION? National Heart, Lung, and Blood Institute: travelstabloid.com   This information is not intended to replace advice given to you by your health care provider. Make sure you discuss any questions you have with your health care provider.   Document Released: 06/28/2011 Document Revised: 07/30/2014 Document Reviewed: 05/13/2013 Elsevier Interactive Patient Education [employer].

## 2015-08-15 NOTE — Progress Notes (Signed)
Patient ID: ALEXSYS ESKIN, female   DOB: Jun 13, 1931, 82 y.o.   MRN: [id number]   Location: [facility] Provider: Rexene Edison. Mariea Clonts, D.O., C.M.D.  Code Status: DNR Goals of Care: Advanced Directive information Does patient have an advance directive?: Yes, Type of Advance Directive: Living will, Does patient want to make changes to advanced directive?: No - Patient declined  Chief Complaint  Patient presents with  . Medical Management of Chronic Issues    6 week follow-up for DM, Hypokalemia,  Hypertension,   lab printed  . OTHER    God daughter and aid in room with patient     HPI: Patient is a 82 y.o. female seen in the office today for med mgt of chronic diseases.  Had been hospitalized for chest pain 1/4-07/29/14.  Diagnosed with CAP.    Feeling good today.  Has an ache in her right side--was going on for at least 3 days.  Started PT again 2 wks ago.  Has been moving and bending a lot more.  Says she feels lazy.  DMII:  Eating a little better.  Is down 1.5lbs.  On ace.  Highest glucose at home 281, lowest 130.  Checking twice a day.  Before breakfast and lunch.   HTN:  Blood pressure is good.  Had one bp 175/106, but then later 98/54 at 4pm.  Improves with rest if elevated.  Did have two episodes of dizziness last week.  No longer using salt shaker, but still working on dietary changes to lower sodium in diet.    Thoracic aortic aneurysm:  4.6 cm on CT scan.  Pt does not want surgical intervention if it gets large enough.  Counseled on keeping bp down.      Constipation:  Bowels moving well.    Vascular dementia:  Generally the same with good and bad days.  Has some trouble finding words.  She says she does not like getting bossed around.  Is back playing the piano, dancing some.  walking without assistive device.  Sounds like she may have had hallucinations of a man in her room.    Chronic diastolic chf:  A little dyspneic when getting her bath and choosing her clothes.   Mostly short of breath when exercising on the exercise bike.    Chronic atrial fibrillation:  Not feeling heart racing a lot.  Tries to keep herself calm.  No complaints of chest pain per goddaughter or caregiver.  Review of Systems:  Review of Systems  Constitutional: Negative for fever and chills.  HENT: Negative for hearing loss.   Eyes:       Glasses  Respiratory: Positive for shortness of breath. Negative for cough and sputum production.   Cardiovascular: Negative for chest pain.  Gastrointestinal: Negative for constipation.  Musculoskeletal: Negative for falls.  Neurological: Positive for dizziness, tingling and sensory change. Negative for loss of consciousness.  Psychiatric/Behavioral: Positive for hallucinations and memory loss.    Past Medical History  Diagnosis Date  . Colon cancer ([place]) 07/1997  . Hypertension   . Diabetes mellitus   . CHF (congestive heart failure) ([place])   . AAA (abdominal aortic aneurysm) ([place])   . Atrial fibrillation ([place])   . Hypercholesteremia   . Kidney stone     renal calculi  . Small bowel obstruction due to adhesions ([place]) 05/16/2012  . Hypothyroidism   . Arthritis   . Anemia   . Cholelithiasis   . Perforation of bile duct   .  Hemorrhage of rectum and anus   . Swelling, mass, or lump in head and neck   . Cervicalgia   . Hypoglycemia, unspecified   . Pain in joint, lower leg   . Anxiety   . Shortness of breath   . Acute upper respiratory infections of unspecified site   . Depression   . Thoracic aneurysm without mention of rupture   . GERD (gastroesophageal reflux disease)   . Proteinuria   . Other specified cardiac dysrhythmias(427.89)   . Congestive heart failure, unspecified   . Electrolyte and fluid disorders not elsewhere classified   . Other chronic allergic conjunctivitis   . Hypopotassemia   . Chronic systolic heart failure ([place])   . Dizziness and giddiness   . Malignant neoplasm of colon, unspecified site   . Other  and unspecified hyperlipidemia   . Gout, unspecified   . Atrial fibrillation ([place])   . Aortic aneurysm of unspecified site without mention of rupture   . Pain in joint, site unspecified   . Palpitations   . CKD (chronic kidney disease)   . Diabetes mellitus [facility])     Past Surgical History  Procedure Laterality Date  . Hemicolectomy  08/11/1997  . Kidney stone surgery    . Colon surgery      to remove colon ca  . Cholecystectomy  05/14/2012    Procedure: LAPAROSCOPIC CHOLECYSTECTOMY WITH INTRAOPERATIVE CHOLANGIOGRAM;  Surgeon: Haywood Lasso, MD;  Location: [facility];  Service: General;  Laterality: N/A;  . Ercp  05/17/2012    Procedure: ENDOSCOPIC RETROGRADE CHOLANGIOPANCREATOGRAPHY (ERCP);  Surgeon: Missy Sabins, MD;  Location: [facility];  Service: Gastroenterology;  Laterality: N/A;  . Ercp  08/12/2012    Procedure: ENDOSCOPIC RETROGRADE CHOLANGIOPANCREATOGRAPHY (ERCP);  Surgeon: Missy Sabins, MD;  Location: [facility] ENDOSCOPY;  Service: Endoscopy;  Laterality: N/A;  . Transthoracic echocardiogram  11/2009    EF 45-50%, mild-mod AV regurg; mild MR; mod TR; ascending aorta mildly dilated  . Cardiac catheterization  2008    moderate severe pulm HTN; with elevted pulm capillary wedge pressure     Allergies  Allergen Reactions  . Iohexol      Code: VOM, Desc: PT REPORTS VOMITING W/ IVP DYE- ARS 12/26/08, Onset Date: 70488891   . Z-Pak [Azithromycin]   . Iodinated Diagnostic Agents Nausea Only    PT HAS HAD SINGULAR INCIDENT OF NAUSEA  W/ IV CONTYRAST INJECTION, NONE IF INJECTED SLOWLY//A.CALHOUN      Medication List       This list is accurate as of: 08/15/15 10:12 AM.  Always use your most recent med list.               ACCU-CHEK AVIVA Soln  See admin instructions.     allopurinol 100 MG tablet  Commonly known as:  ZYLOPRIM  Take 100 mg by mouth daily.     aspirin EC 81 MG tablet  Take 81 mg by mouth daily.     blood glucose meter kit and supplies  Dispense based on patient  and insurance preference. Use up to four times daily as directed. (FOR ICD-9 250.00, 250.01).     cloNIDine 0.2 MG tablet  Commonly known as:  CATAPRES  Take one tablet by mouth three times daily for hypertension     donepezil 5 MG tablet  Commonly known as:  ARICEPT  Take 1 tablet (5 mg total) by mouth at bedtime. Start one tablet daily x 4 weeks then two tablets daily  EASY COMFORT LANCETS Misc  3 (three) times daily. for testing     freestyle lancets  Use as instructed     feeding supplement (ENSURE ENLIVE) Liqd  Take 237 mLs by mouth 2 (two) times daily between meals.     furosemide 20 MG tablet  Commonly known as:  LASIX  Take 1 tablet (20 mg total) by mouth daily as needed (take for more than 2 Lb weight gain in a day or pedal edema along with potassium).     glucose blood test strip  Commonly known as:  FREESTYLE TEST STRIPS  Use as instructed     levothyroxine 50 MCG tablet  Commonly known as:  SYNTHROID, LEVOTHROID  Take one tablet by mouth 30 minutes before breakfast away from other medications for thyroid     lisinopril 20 MG tablet  Commonly known as:  PRINIVIL,ZESTRIL  Take one tablet by mouth twice daily for hypertension     MELATIN PO  Take 1 tablet by mouth at bedtime as needed. For sleep     metoprolol 50 MG tablet  Commonly known as:  LOPRESSOR  Take 1 tablet (50 mg total) by mouth 2 (two) times daily.     potassium chloride 10 MEQ tablet  Commonly known as:  K-DUR  Take 2 tablets (20 mEq total) by mouth daily as needed (take with lasix only.).     PROTONIX 40 mg/20 mL Pack  Generic drug:  pantoprazole sodium  Take 40 mg by mouth daily.     senna-docusate 8.6-50 MG tablet  Commonly known as:  Senokot-S  Take 1 tablet by mouth 2 (two) times daily.        Health Maintenance  Topic Date Due  . OPHTHALMOLOGY EXAM  03/22/1941  . ZOSTAVAX  03/21/1993  . HEMOGLOBIN A1C  12/28/2015  . INFLUENZA VACCINE  02/21/2016  . FOOT EXAM  02/24/2016    . TETANUS/TDAP  09/16/2021  . DEXA SCAN  Completed  . PNA vac Low Risk Adult  Completed    Physical Exam: Filed Vitals:   08/15/15 0955  BP: 120/88  Pulse: 57  Temp: 97.4 F (36.3 C)  TempSrc: Oral  Resp: 20  Height: _0  (1.727 m)  Weight: 155 lb 6.4 oz (70.489 kg)  SpO2: 96%   Body mass index is 23.63 kg/(m^2). Physical Exam  Constitutional: She appears well-developed and well-nourished. No distress.  Cardiovascular:  irreg irreg  Pulmonary/Chest: Effort normal and breath sounds normal. No respiratory distress.  Abdominal: Soft. Bowel sounds are normal.  Musculoskeletal: Normal range of motion.  Ambulates w/o assistive device; right intercostal region posteriorly is tender and into thoracic paravertebral area  Neurological: She is alert.  Skin: Skin is warm and dry.  Psychiatric: She has a normal mood and affect.    Labs reviewed: Basic Metabolic Panel:  Recent Labs  02/17/15 0810  04/05/15 0819  06/13/15 1524  07/27/15 2008 07/28/15 0520 07/29/15 0411  NA 145*  < > 144  < >  --   < > 139 139 141  K 3.7  < > 3.3*  < >  --   < > 4.7 4.1 3.8  CL 100  < > 110  < >  --   < > 107 107 103  CO2 26  < > 26  < >  --   < > _1 GLUCOSE 54*  < > 139*  < >  --   < > 175* 121* 134*  BUN 12  < > 10  < >  --   < > _0 CREATININE 1.23*  < > 1.10*  < >  --   < > 1.42* 1.24* 1.35*  CALCIUM 9.0  < > 8.9  < >  --   < > 8.9 8.7* 9.0  TSH 2.200  --  2.053  --  2.210  --   --   --   --   < > = values in this interval not displayed. Liver Function Tests:  Recent Labs  10/04/14 0922 04/04/15 0945 04/13/15 0546  AST 23 22 13*  ALT 13 10* 8*  ALKPHOS 107 91 71  BILITOT 0.9 1.0 0.8  PROT 7.7 7.8 6.4*  ALBUMIN 3.7 3.6 2.5*   No results for input(s): LIPASE, AMYLASE in the last 8760 hours. No results for input(s): AMMONIA in the last 8760 hours. CBC:  Recent Labs  04/04/15 0945  04/13/15 0546  06/29/15 0938  07/27/15 2008 07/28/15 0520 07/29/15 0411   WBC 7.1  < > 8.7  < > 8.8  --  8.5 7.9 9.0  NEUTROABS 6.1  --  6.7  --  5.8  --   --   --   --   HGB 12.8  < > 11.7*  < >  --   < > 8.6* 8.4* 10.2*  HCT 37.4  < > 34.2*  < > 25.4*  < > 27.0* 25.6* 30.2*  MCV 77.1*  < > 76.5*  < > 79  --  68.0* 68.3* 66.5*  PLT 329  < > 297  < > 441*  --  455* 456* 511*  < > = values in this interval not displayed. Lipid Panel:  Recent Labs  02/17/15 0810 04/05/15 0819  CHOL 176 161  HDL 70 43  LDLCALC 92 97  TRIG 69 104  CHOLHDL 2.5 3.7   Lab Results  Component Value Date   HGBA1C 7.6* 06/29/2015    Procedures since last visit: Ct Head Wo Contrast  07/28/2015  CLINICAL DATA:  Acute onset of constipation and headache. Initial encounter. EXAM: CT HEAD WITHOUT CONTRAST TECHNIQUE: Contiguous axial images were obtained from the base of the skull through the vertex without intravenous contrast. COMPARISON:  CT of the head performed 06/24/2015 FINDINGS: There is no evidence of acute infarction, mass lesion, or intra- or extra-axial hemorrhage on CT. Prominence of the ventricles and sulci reflects mild to moderate cortical volume loss. Scattered periventricular and subcortical white matter change likely reflects small vessel ischemic microangiopathy. The brainstem and fourth ventricle are within normal limits. The basal ganglia are unremarkable in appearance. The cerebral hemispheres demonstrate grossly normal gray-white differentiation. No mass effect or midline shift is seen. There is no evidence of fracture; visualized osseous structures are unremarkable in appearance. The orbits are within normal limits. The paranasal sinuses and mastoid air cells are well-aerated. No significant soft tissue abnormalities are seen. IMPRESSION: 1. No acute intracranial pathology seen on CT. 2. Mild to moderate cortical volume loss and scattered small vessel ischemic microangiopathy. Electronically Signed   By: Garald Balding M.D.   On: 07/28/2015 02:52   Ct Chest Wo  Contrast  07/28/2015  CLINICAL DATA:  Acute onset of constipation and headache. Generalized chest pain. Initial encounter. EXAM: CT CHEST WITHOUT CONTRAST TECHNIQUE: Multidetector CT imaging of the chest was performed following the standard protocol without IV contrast. COMPARISON:  Chest radiograph performed 07/27/2015 FINDINGS: Small right and trace left pleural effusions  are noted, with associated atelectasis. No pneumothorax is seen. No masses are identified. Diffuse coronary artery calcifications are seen. Calcification is noted along the thoracic aorta. There is aneurysmal dilatation of the ascending thoracic aorta to 4.6 cm in AP dimension. The great vessels are grossly unremarkable in appearance. No pericardial effusion is identified. Visualized mediastinal nodes remain normal in size. The thyroid gland is unremarkable in appearance. No axillary lymphadenopathy is seen. The visualized portions of the liver are unremarkable. 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 Scarring is noted at the upper pole of the left kidney. The visualized portions of the pancreas are unremarkable. No acute osseous abnormalities are seen. IMPRESSION: 1. Small right and trace left pleural effusions, with associated atelectasis. 2. Diffuse coronary artery calcifications seen. 3. Aneurysmal dilatation of the ascending thoracic aorta to 4.6 cm in AP dimension. Ascending thoracic aortic aneurysm. Recommend semi-annual imaging followup by CTA or MRA and referral to cardiothoracic surgery if not already obtained. This recommendation follows 2010 ACCF/AHA/AATS/ACR/ASA/SCA/SCAI/SIR/STS/SVM Guidelines for the Diagnosis and Management of Patients With Thoracic Aortic Disease. Circulation. 2010; 121: G921-J941 4. Scarring at the upper pole of the left kidney. Electronically Signed   By: Garald Balding M.D.   On: 07/28/2015 02:51   Dg Chest Port 1 View  07/27/2015  CLINICAL DATA:  Chest pain  EXAM: PORTABLE CHEST - 1 VIEW COMPARISON:  04/09/2015 FINDINGS: Cardiac shadow is mildly enlarged. Increased density is noted in the right lung base consistent with early infiltrate with associated small effusion. The bony structures are within normal limits. The left lung is clear. IMPRESSION: Mild right basilar infiltrate with associated effusion. Electronically Signed   By: Inez Catalina M.D.   On: 07/27/2015 20:12   Assessment/Plan 1. Type 2 diabetes mellitus with diabetic nephropathy, without long-term current use of insulin (HCC) - well controlled for her age and dementia -counseled on some changes to diet to improve to <200 - Lancets (FREESTYLE) lancets; Use as instructed  Dispense: 100 each; Refill: 0 - Hemoglobin A1c; Future - Lipid panel; Future - Basic metabolic panel; Future - CBC with Differential/Platelet; Future  2. Subcortical vascular dementia, without behavioral disturbance - cont aricept  -doing well here without significant changes in past 6 wks -did have what sounds like delirium with hallucinations during hospitalization for pneumonia  3. Chronic atrial fibrillation (HCC) -cont lopressor for rate control and baby asa only due to h/o intracranial bleeding  4. Essential hypertension -bp is at goal at present--seems to range from low to high -caregiver will continue to monitor -important to be at least <140 due to her aneurysm  5. Hypothyroidism due to acquired atrophy of thyroid -cont synthroid - TSH; Future  6. Chronic diastolic congestive heart failure (HCC) -cont lasix, avoid high sodium foods and adding salt, DASH diet provided  7. Rib pain on right side -suspect due to prior pneumonia earlier this month and coughing and now recent increased activity with therapy--muscle involved -advised warm heat 20 mins three times daily  Labs/tests ordered:  Orders Placed This Encounter  Procedures  . Hemoglobin A1c    Standing Status: Future     Number of  Occurrences:      Standing Expiration Date: 01/13/2016  . Lipid panel    Standing Status: Future     Number of Occurrences:      Standing Expiration Date: 01/13/2016    Order Specific Question:  Has the patient fasted?    Answer:  Yes  .  Basic metabolic panel    Standing Status: Future     Number of Occurrences:      Standing Expiration Date: 01/13/2016    Order Specific Question:  Has the patient fasted?    Answer:  Yes  . CBC with Differential/Platelet    Standing Status: Future     Number of Occurrences:      Standing Expiration Date: 01/13/2016  . TSH    Standing Status: Future     Number of Occurrences:      Standing Expiration Date: 01/13/2016   Next appt:  10/14/2015 with labs before   [place]. Tykera Skates, D.O. [facility] [address], [place] [address] Cell Phone (Mon-Fri 8am-5pm):  [phone] On Call:  [phone] & follow prompts after 5pm & weekends Office Phone:  [phone] Office Fax:  [phone]

## 2015-08-16 ENCOUNTER — Telehealth: Payer: Self-pay | Admitting: *Deleted

## 2015-08-16 DIAGNOSIS — F0150 Vascular dementia without behavioral disturbance: Secondary | ICD-10-CM | POA: Diagnosis not present

## 2015-08-16 DIAGNOSIS — E1121 Type 2 diabetes mellitus with diabetic nephropathy: Secondary | ICD-10-CM | POA: Diagnosis not present

## 2015-08-16 DIAGNOSIS — I130 Hypertensive heart and chronic kidney disease with heart failure and stage 1 through stage 4 chronic kidney disease, or unspecified chronic kidney disease: Secondary | ICD-10-CM | POA: Diagnosis not present

## 2015-08-16 DIAGNOSIS — E1122 Type 2 diabetes mellitus with diabetic chronic kidney disease: Secondary | ICD-10-CM | POA: Diagnosis not present

## 2015-08-16 NOTE — Telephone Encounter (Signed)
Judeen Hammans) nurse

## 2015-08-16 NOTE — Telephone Encounter (Signed)
LeStarr from [facility] called regarding a podiatrist visit that the patient's caregiver had spoke to her about. She stated that the patient had a black spot on her big toe on the left foot that had a biopsy and wanted to know the results. I informed her that the office has no notes or letters from a podiatrist regarding a black spot on this patient left big toe. She stated that she will call the patient's family to see if they had heard from the podiatrist at all.

## 2015-08-17 ENCOUNTER — Telehealth: Payer: Self-pay | Admitting: *Deleted

## 2015-08-17 DIAGNOSIS — F0150 Vascular dementia without behavioral disturbance: Secondary | ICD-10-CM | POA: Diagnosis not present

## 2015-08-17 DIAGNOSIS — I130 Hypertensive heart and chronic kidney disease with heart failure and stage 1 through stage 4 chronic kidney disease, or unspecified chronic kidney disease: Secondary | ICD-10-CM | POA: Diagnosis not present

## 2015-08-17 DIAGNOSIS — E1121 Type 2 diabetes mellitus with diabetic nephropathy: Secondary | ICD-10-CM | POA: Diagnosis not present

## 2015-08-17 DIAGNOSIS — E1122 Type 2 diabetes mellitus with diabetic chronic kidney disease: Secondary | ICD-10-CM | POA: Diagnosis not present

## 2015-08-17 NOTE — Telephone Encounter (Signed)
Spoke with Domenica Reamer regarding patient seeing a podiatrist

## 2015-08-17 NOTE — Telephone Encounter (Signed)
Yes, I have no record of the visit, biopsy or results.  The podiatry office would be the one to know about this.

## 2015-08-18 DIAGNOSIS — I130 Hypertensive heart and chronic kidney disease with heart failure and stage 1 through stage 4 chronic kidney disease, or unspecified chronic kidney disease: Secondary | ICD-10-CM | POA: Diagnosis not present

## 2015-08-18 DIAGNOSIS — E1122 Type 2 diabetes mellitus with diabetic chronic kidney disease: Secondary | ICD-10-CM | POA: Diagnosis not present

## 2015-08-18 DIAGNOSIS — E1121 Type 2 diabetes mellitus with diabetic nephropathy: Secondary | ICD-10-CM | POA: Diagnosis not present

## 2015-08-18 DIAGNOSIS — F0150 Vascular dementia without behavioral disturbance: Secondary | ICD-10-CM | POA: Diagnosis not present

## 2015-08-18 NOTE — Telephone Encounter (Signed)
LMOM with Danielle with [name]

## 2015-08-18 NOTE — Telephone Encounter (Signed)
Nicole [name] nurse) stated that the podiatry office is [facility] [place] and the patient has to return to the office to get results of her biopsy.

## 2015-08-19 DIAGNOSIS — F0150 Vascular dementia without behavioral disturbance: Secondary | ICD-10-CM | POA: Diagnosis not present

## 2015-08-19 DIAGNOSIS — I130 Hypertensive heart and chronic kidney disease with heart failure and stage 1 through stage 4 chronic kidney disease, or unspecified chronic kidney disease: Secondary | ICD-10-CM | POA: Diagnosis not present

## 2015-08-19 DIAGNOSIS — E1121 Type 2 diabetes mellitus with diabetic nephropathy: Secondary | ICD-10-CM | POA: Diagnosis not present

## 2015-08-19 DIAGNOSIS — E1122 Type 2 diabetes mellitus with diabetic chronic kidney disease: Secondary | ICD-10-CM | POA: Diagnosis not present

## 2015-08-23 DIAGNOSIS — L97529 Non-pressure chronic ulcer of other part of left foot with unspecified severity: Secondary | ICD-10-CM | POA: Diagnosis not present

## 2015-08-23 DIAGNOSIS — M2012 Hallux valgus (acquired), left foot: Secondary | ICD-10-CM | POA: Diagnosis not present

## 2015-08-23 DIAGNOSIS — M2011 Hallux valgus (acquired), right foot: Secondary | ICD-10-CM | POA: Diagnosis not present

## 2015-08-24 DIAGNOSIS — I130 Hypertensive heart and chronic kidney disease with heart failure and stage 1 through stage 4 chronic kidney disease, or unspecified chronic kidney disease: Secondary | ICD-10-CM | POA: Diagnosis not present

## 2015-08-24 DIAGNOSIS — E1122 Type 2 diabetes mellitus with diabetic chronic kidney disease: Secondary | ICD-10-CM | POA: Diagnosis not present

## 2015-08-24 DIAGNOSIS — F0150 Vascular dementia without behavioral disturbance: Secondary | ICD-10-CM | POA: Diagnosis not present

## 2015-08-24 DIAGNOSIS — E1121 Type 2 diabetes mellitus with diabetic nephropathy: Secondary | ICD-10-CM | POA: Diagnosis not present

## 2015-08-25 ENCOUNTER — Ambulatory Visit (INDEPENDENT_AMBULATORY_CARE_PROVIDER_SITE_OTHER): Payer: Medicare Other | Admitting: Neurology

## 2015-08-25 ENCOUNTER — Encounter: Payer: Self-pay | Admitting: Neurology

## 2015-08-25 VITALS — BP 146/71 | HR 65 | Ht 68.0 in | Wt 164.0 lb

## 2015-08-25 DIAGNOSIS — I610 Nontraumatic intracerebral hemorrhage in hemisphere, subcortical: Secondary | ICD-10-CM | POA: Diagnosis not present

## 2015-08-25 NOTE — Patient Instructions (Signed)
I had a long d/w patient and god daughter about her recent brain hemorrhage on warfarin, atrial fibrillation, risk for recurrent stroke/TIAs, personally independently reviewed imaging studies and stroke evaluation results and answered questions.Continue aspirin 81 mg daily  for secondary stroke prevention for now. We discussed alternative treatment options including changing to eliquis or undergoing the watchman device. I am reluctant to reinitiate warfarin again even if she needs it only for 4 weeks following the watchman device due to significant increased risk for hemorrhage.I recommend she maintain strict control of hypertension with blood pressure goal below 130/90, diabetes with hemoglobin A1c goal below 6.5% and lipids with LDL cholesterol goal below 70 mg/dL. I also advised the patient to eat a healthy diet with plenty of whole grains, cereals, fruits and vegetables, exercise regularly and maintain ideal body weight Followup in the future with me in 6 months or call earlier if necessary

## 2015-08-25 NOTE — Progress Notes (Signed)
[facility] [address] [place]. [place] [address] [phone]       OFFICE FOLLOW-UP NOTE  [name] Date of Birth:  Feb 07, 1931 Medical Record Number:  [id number]   HPI: [name] is a 82 year old pleasant African-American lady is seen today for first office follow-up visit following hospital admission for intracerebral hemorrhage in September 2016. Nicole [name] is an 82 y.o. female history of atrial fibrillation on Coumadin, hypertension, hyperlipidemia, diabetes mellitus, CHF and chronic kidney disease, brought to the emergency room after being found on the floor home this morning. She was noted to be somewhat confused last night. She complained of feeling of wooziness and slight headache at 3:30 PM yesterday. She has no previous history of stroke nor TIA. CT scan of her head showed fairly large intracerebral hemorrhage involving the right caudate and extending into right ventricle with mass effect with mild midline shift as well as right ventricular enlargement. She has been evaluated by Dr. Saintclair Halsted of the Neurosurgery Service. Ventricular drain is not felt to be warranted at this time. Cerebral hematoma not considered operable. Patient is being admitted to neuro intensive care unit for further management, including immediate coagulation reversal. INR was 3.6. Patient's blood pressure was elevated at 166/103. She was given labetalol IV for immediate management. LSN: 3:30 PM on 04/03/2015 tPA Given: No: Acute ICH. She was admitted to the intensive care unit where blood pressure was tightly controlled. Warfarin coagulopathy with INR of 3.6 on admission was reversed with Kcentra infusion. Follow-up CT scan 3 showed stable appearance of the hematoma and mild right lateral ventriculomegaly and obstructive hydrocephalus but without significant intraventricular extension. Patient did not require neurosurgical intervention or ventriculostomy catheter placement. She  remains drowsy for a few days but then mental status improved. Transthoracic echo showed normal ejection fraction. LDL cholesterol was elevated at 97 mg percent. Hemoglobin A1c was 5.9. She was seen by physical occupational speech therapy and showed gradual improvement. She was transferred to inpatient rehabilitation where she was there for a few weeks. She had follow-up CT scan of the head done on 07/28/15 which I have seen shows significant resolution of the hemorrhage with expected evolutionary changes. She has been started on aspirin 81 mg. She is currently living at home. She is getting outpatient physical and occupational therapy. She has a knee during the day and a family member Nicole [name] night with her. She is able to ambulate independently without her walker or a cane. She has very minimum diminished fine motor skills on the left but otherwise no weakness. She has seen a cardiologist Dr. Debara Pickett who feels she may be a candidate for the watchman device. ROS:   14 system review of systems is positive for  appetite change, weakness and all other systems negative  PMH:  Past Medical History  Diagnosis Date  . Colon cancer ([place]) 07/1997  . Hypertension   . Diabetes mellitus   . CHF (congestive heart failure) ([place])   . AAA (abdominal aortic aneurysm) ([place])   . Atrial fibrillation ([place])   . Hypercholesteremia   . Kidney stone     renal calculi  . Small bowel obstruction due to adhesions ([place]) 05/16/2012  . Hypothyroidism   . Arthritis   . Anemia   . Cholelithiasis   . Perforation of bile duct   . Hemorrhage of rectum and anus   . Swelling, mass, or lump in head and neck   . Cervicalgia   . Hypoglycemia, unspecified   .  Pain in joint, lower leg   . Anxiety   . Shortness of breath   . Acute upper respiratory infections of unspecified site   . Depression   . Thoracic aneurysm without mention of rupture   . GERD (gastroesophageal reflux disease)   . Proteinuria   . Other specified cardiac  dysrhythmias(427.89)   . Congestive heart failure, unspecified   . Electrolyte and fluid disorders not elsewhere classified   . Other chronic allergic conjunctivitis   . Hypopotassemia   . Chronic systolic heart failure ([place])   . Dizziness and giddiness   . Malignant neoplasm of colon, unspecified site   . Other and unspecified hyperlipidemia   . Gout, unspecified   . Atrial fibrillation ([place])   . Aortic aneurysm of unspecified site without mention of rupture   . Pain in joint, site unspecified   . Palpitations   . CKD (chronic kidney disease)   . Diabetes mellitus ([place])   . Stroke [facility])     Social History:  Social History   Social History  . Marital Status: Married    Spouse Name: N/A  . Number of Children: N/A  . Years of Education: N/A   Occupational History  . Not on file.   Social History Main Topics  . Smoking status: Never Smoker   . Smokeless tobacco: Never Used  . Alcohol Use: No  . Drug Use: No  . Sexual Activity: No   Other Topics Concern  . Not on file   Social History Narrative    Medications:   Current Outpatient Prescriptions on File Prior to Visit  Medication Sig Dispense Refill  . allopurinol (ZYLOPRIM) 100 MG tablet Take 1 tablet (100 mg total) by mouth daily. 30 tablet 3  . aspirin EC 81 MG tablet Take 81 mg by mouth daily.    . Blood Glucose Calibration (ACCU-CHEK AVIVA) SOLN See admin instructions.  99  . blood glucose meter kit and supplies Dispense based on patient and insurance preference. Use up to four times daily as directed. (FOR ICD-9 250.00, 250.01). 1 each 0  . cloNIDine (CATAPRES) 0.2 MG tablet Take one tablet by mouth three times daily for hypertension (Patient taking differently: Take 0.2 mg by mouth 3 (three) times daily. ) 90 tablet 2  . donepezil (ARICEPT) 5 MG tablet Take 1 tablet (5 mg total) by mouth at bedtime. Start one tablet daily x 4 weeks then two tablets daily 60 tablet 1  . feeding supplement, ENSURE ENLIVE, (ENSURE  ENLIVE) LIQD Take 237 mLs by mouth 2 (two) times daily between meals. 237 mL 12  . furosemide (LASIX) 20 MG tablet Take 1 tablet (20 mg total) by mouth daily as needed (take for more than 2 Lb weight gain in a day or pedal edema along with potassium). 30 tablet 0  . glucose blood (ACCU-CHEK AVIVA) test strip Use to check blood sugar three times daily. Dx: E11.21 100 each 12  . levothyroxine (SYNTHROID, LEVOTHROID) 50 MCG tablet Take one tablet by mouth 30 minutes before breakfast away from other medications for thyroid    . lisinopril (PRINIVIL,ZESTRIL) 20 MG tablet Take one tablet by mouth twice daily for hypertension    . Melatonin-Pyridoxine (MELATIN PO) Take 1 tablet by mouth at bedtime as needed. For sleep    . metoprolol (LOPRESSOR) 50 MG tablet Take 1 tablet (50 mg total) by mouth 2 (two) times daily. 60 tablet 3  . pantoprazole sodium (PROTONIX) 40 mg/20 mL PACK Take 40 mg  by mouth daily.     . potassium chloride (K-DUR) 10 MEQ tablet Take 2 tablets (20 mEq total) by mouth daily as needed (take with lasix only.). 30 tablet 0  . senna-docusate (SENOKOT-S) 8.6-50 MG tablet Take 1 tablet by mouth 2 (two) times daily. 30 tablet 0   No current facility-administered medications on file prior to visit.    Allergies:   Allergies  Allergen Reactions  . Iohexol      Code: VOM, Desc: PT REPORTS VOMITING W/ IVP DYE- ARS 12/26/08, Onset Date: [id number]   . Z-Pak [Azithromycin]   . Iodinated Diagnostic Agents Nausea Only    PT HAS HAD SINGULAR INCIDENT OF NAUSEA  W/ IV CONTYRAST INJECTION, NONE IF INJECTED SLOWLY//A.CALHOUN    Physical Exam General: well developed, well nourished elderly African-American lady, seated, in no evident distress Head: head normocephalic and atraumatic.  Neck: supple with no carotid or supraclavicular bruits Cardiovascular: regular rate and rhythm, no murmurs Musculoskeletal: no deformity Skin:  no rash/petichiae Vascular:  Normal pulses all extremities Filed  Vitals:   08/25/15 1542  BP: 146/71  Pulse: 65   Neurologic Exam Mental Status: Awake and fully alert. Oriented to place and time. Recent and remote memory intact. Attention span, concentration and fund of knowledge appropriate. Mood and affect appropriate.  Cranial Nerves: Fundoscopic exam reveals sharp disc margins. Pupils equal, briskly reactive to light. Extraocular movements full without nystagmus. Visual fields full to confrontation. Hearing intact. Facial sensation intact. Face, tongue, palate moves normally and symmetrically.  Motor: Normal bulk and tone. Normal strength in all tested extremity muscles. Diminished fine finger movements on the left. Mild weakness of left grip. Orbits right over left upper extremity. Mild weakness of left hip flexors and ankle dorsiflexors. Sensory.: intact to touch ,pinprick .position and vibratory sensation.  Coordination: Rapid alternating movements normal in all extremities. Finger-to-nose and heel-to-shin performed accurately bilaterally. Gait and Station: Arises from chair without difficulty. Stance is normal. Gait demonstrates normal stride length and balance but slight dragging of the left leg. Not able to heel, toe and tandem walk without difficulty.  Reflexes: 1+ and symmetric. Toes downgoing.   NIHSS  0 Modified [place]  1  ASSESSMENT: 52 year African-American lady with right basal ganglia hemorrhage in September 2016 due to hypertension and warfarin related coagulopathy who was doing remarkably well. Vascular risk factors of atrial fibrillation, hypertension    PLAN: I had a long d/w patient and god daughter about her recent brain hemorrhage on warfarin, atrial fibrillation, risk for recurrent stroke/TIAs, personally independently reviewed imaging studies and stroke evaluation results and answered questions.Continue aspirin 81 mg daily  for secondary stroke prevention for now. We discussed alternative treatment options including changing to  eliquis or undergoing the watchman device. I am reluctant to reinitiate warfarin again even if she needs it only for 4 weeks following the watchman device due to significant increased risk for hemorrhage.I recommend she maintain strict control of hypertension with blood pressure goal below 130/90, diabetes with hemoglobin A1c goal below 6.5% and lipids with LDL cholesterol goal below 70 mg/dL. I also advised the patient to eat a healthy diet with plenty of whole grains, cereals, fruits and vegetables, exercise regularly and maintain ideal body weight Greater than 50% of time during this 25 minute visit was spent on counseling,explanation of diagnosis, planning of further management, discussion with patient and family and coordination of care .Followup in the future with me in 6 months or call earlier if necessary  Antony Contras,  MD  Note: This document was prepared with digital dictation and possible smart phrase technology. Any transcriptional errors that result from this process are unintentional

## 2015-08-26 ENCOUNTER — Telehealth: Payer: Self-pay | Admitting: *Deleted

## 2015-08-26 NOTE — Telephone Encounter (Signed)
[name], caregiver called and stated that they have been paying out of pocket for Nicole [name] for patient and they are going through an agency called, [facility] now and they require the Dr. To fill out a Form and the services will be covered through her insurance. Caregiver to drop off form.

## 2015-08-30 DIAGNOSIS — I130 Hypertensive heart and chronic kidney disease with heart failure and stage 1 through stage 4 chronic kidney disease, or unspecified chronic kidney disease: Secondary | ICD-10-CM | POA: Diagnosis not present

## 2015-08-30 DIAGNOSIS — E1122 Type 2 diabetes mellitus with diabetic chronic kidney disease: Secondary | ICD-10-CM | POA: Diagnosis not present

## 2015-08-30 DIAGNOSIS — E1121 Type 2 diabetes mellitus with diabetic nephropathy: Secondary | ICD-10-CM | POA: Diagnosis not present

## 2015-08-30 DIAGNOSIS — F0150 Vascular dementia without behavioral disturbance: Secondary | ICD-10-CM | POA: Diagnosis not present

## 2015-08-31 DIAGNOSIS — F0150 Vascular dementia without behavioral disturbance: Secondary | ICD-10-CM | POA: Diagnosis not present

## 2015-08-31 DIAGNOSIS — E1121 Type 2 diabetes mellitus with diabetic nephropathy: Secondary | ICD-10-CM | POA: Diagnosis not present

## 2015-08-31 DIAGNOSIS — I130 Hypertensive heart and chronic kidney disease with heart failure and stage 1 through stage 4 chronic kidney disease, or unspecified chronic kidney disease: Secondary | ICD-10-CM | POA: Diagnosis not present

## 2015-08-31 DIAGNOSIS — E1122 Type 2 diabetes mellitus with diabetic chronic kidney disease: Secondary | ICD-10-CM | POA: Diagnosis not present

## 2015-09-01 DIAGNOSIS — F0150 Vascular dementia without behavioral disturbance: Secondary | ICD-10-CM | POA: Diagnosis not present

## 2015-09-01 DIAGNOSIS — E1122 Type 2 diabetes mellitus with diabetic chronic kidney disease: Secondary | ICD-10-CM | POA: Diagnosis not present

## 2015-09-01 DIAGNOSIS — I130 Hypertensive heart and chronic kidney disease with heart failure and stage 1 through stage 4 chronic kidney disease, or unspecified chronic kidney disease: Secondary | ICD-10-CM | POA: Diagnosis not present

## 2015-09-01 DIAGNOSIS — E1121 Type 2 diabetes mellitus with diabetic nephropathy: Secondary | ICD-10-CM | POA: Diagnosis not present

## 2015-09-08 ENCOUNTER — Telehealth: Payer: Self-pay

## 2015-09-08 ENCOUNTER — Telehealth: Payer: Self-pay | Admitting: Internal Medicine

## 2015-09-08 NOTE — Telephone Encounter (Signed)
Last echo 07/28/2015 EF 55-60%   ASHLEY VOICED UNDERSTANDING

## 2015-09-08 NOTE — Telephone Encounter (Signed)
NEw Message  Care manager from [facility] calling about any additional testing/ updated EF for pt. Please call back and discuss.

## 2015-09-08 NOTE — Telephone Encounter (Signed)
LEFT MESSAGE TO CALL BACK

## 2015-09-09 ENCOUNTER — Other Ambulatory Visit: Payer: Self-pay

## 2015-09-09 ENCOUNTER — Telehealth: Payer: Self-pay | Admitting: Internal Medicine

## 2015-09-09 DIAGNOSIS — F01518 Vascular dementia, unspecified severity, with other behavioral disturbance: Secondary | ICD-10-CM

## 2015-09-09 DIAGNOSIS — F0150 Vascular dementia without behavioral disturbance: Secondary | ICD-10-CM

## 2015-09-09 DIAGNOSIS — Z1231 Encounter for screening mammogram for malignant neoplasm of breast: Secondary | ICD-10-CM | POA: Diagnosis not present

## 2015-09-09 DIAGNOSIS — F0151 Vascular dementia with behavioral disturbance: Secondary | ICD-10-CM

## 2015-09-09 LAB — HM MAMMOGRAPHY: HM Mammogram: NEGATIVE

## 2015-09-09 MED ORDER — DONEPEZIL HCL 5 MG PO TABS: 5.0000 mg | ORAL_TABLET | Freq: Every day | ORAL | Status: DC

## 2015-09-09 NOTE — Telephone Encounter (Signed)
Nicole [name] dropped off a medical assistance form off to be filled out by Dr. Mariea Clonts. Please call Nicole [name] at [phone] when form is ready to be picked up. Form was placed in the rx tray for CMA on 09/09/2015.

## 2015-09-09 NOTE — Telephone Encounter (Signed)
Received [employer] ([employer]) form. Filled out and given to Dr. Mariea Clonts to review and sign. To be faxed back to [facility] Fax:[phone] #:[phone]. [name]) dropped off forms and to call her when completed Cell #: [phone]

## 2015-09-12 ENCOUNTER — Encounter: Payer: Self-pay | Admitting: *Deleted

## 2015-09-12 DIAGNOSIS — Z029 Encounter for administrative examinations, unspecified: Secondary | ICD-10-CM

## 2015-09-19 ENCOUNTER — Other Ambulatory Visit: Payer: Self-pay | Admitting: *Deleted

## 2015-09-19 MED ORDER — CLONIDINE HCL 0.2 MG PO TABS: ORAL_TABLET | ORAL | Status: DC

## 2015-09-19 NOTE — Telephone Encounter (Signed)
[facility] 

## 2015-10-04 ENCOUNTER — Telehealth: Payer: Self-pay | Admitting: *Deleted

## 2015-10-04 NOTE — Telephone Encounter (Signed)
Received fax form from [facility] [phone] for Statement of Certifying Physician for [employer]rapeutic Shoes. Printed face sheet and last OV note and attached and given to Dr. Mariea Clonts to review and sign. To be faxed back to Fax: [phone]

## 2015-10-12 ENCOUNTER — Other Ambulatory Visit: Payer: Medicare Other

## 2015-10-12 DIAGNOSIS — E038 Other specified hypothyroidism: Secondary | ICD-10-CM | POA: Diagnosis not present

## 2015-10-12 DIAGNOSIS — E034 Atrophy of thyroid (acquired): Secondary | ICD-10-CM | POA: Diagnosis not present

## 2015-10-12 DIAGNOSIS — E1121 Type 2 diabetes mellitus with diabetic nephropathy: Secondary | ICD-10-CM

## 2015-10-13 LAB — CBC WITH DIFFERENTIAL/PLATELET
Basophils Absolute: 0 10*3/uL (ref 0.0–0.2)
Basos: 0 %
EOS (ABSOLUTE): 0.2 10*3/uL (ref 0.0–0.4)
Eos: 2 %
Hematocrit: 33.4 % — ABNORMAL LOW (ref 34.0–46.6)
Hemoglobin: 9.8 g/dL — ABNORMAL LOW (ref 11.1–15.9)
Immature Grans (Abs): 0 10*3/uL (ref 0.0–0.1)
Immature Granulocytes: 0 %
Lymphocytes Absolute: 1.7 10*3/uL (ref 0.7–3.1)
Lymphs: 24 %
MCH: 19.1 pg — ABNORMAL LOW (ref 26.6–33.0)
MCHC: 29.3 g/dL — ABNORMAL LOW (ref 31.5–35.7)
MCV: 65 fL — ABNORMAL LOW (ref 79–97)
Monocytes Absolute: 0.6 10*3/uL (ref 0.1–0.9)
Monocytes: 8 %
Neutrophils Absolute: 4.7 10*3/uL (ref 1.4–7.0)
Neutrophils: 66 %
Platelets: 529 10*3/uL — ABNORMAL HIGH (ref 150–379)
RBC: 5.14 x10E6/uL (ref 3.77–5.28)
RDW: 22.3 % — ABNORMAL HIGH (ref 12.3–15.4)
WBC: 7.2 10*3/uL (ref 3.4–10.8)

## 2015-10-13 LAB — BASIC METABOLIC PANEL
BUN/Creatinine Ratio: 11 (ref 11–26)
BUN: 12 mg/dL (ref 8–27)
CO2: 23 mmol/L (ref 18–29)
Calcium: 9.1 mg/dL (ref 8.7–10.3)
Chloride: 100 mmol/L (ref 96–106)
Creatinine, Ser: 1.13 mg/dL — ABNORMAL HIGH (ref 0.57–1.00)
GFR calc Af Amer: 52 mL/min/{1.73_m2} — ABNORMAL LOW (ref >59)
GFR calc non Af Amer: 45 mL/min/{1.73_m2} — ABNORMAL LOW (ref >59)
Glucose: 120 mg/dL — ABNORMAL HIGH (ref 65–99)
Potassium: 4 mmol/L (ref 3.5–5.2)
Sodium: 139 mmol/L (ref 134–144)

## 2015-10-13 LAB — LIPID PANEL
Chol/HDL Ratio: 3 ratio units (ref 0.0–4.4)
Cholesterol, Total: 142 mg/dL (ref 100–199)
HDL: 47 mg/dL (ref >39)
LDL Calculated: 79 mg/dL (ref 0–99)
Triglycerides: 82 mg/dL (ref 0–149)
VLDL Cholesterol Cal: 16 mg/dL (ref 5–40)

## 2015-10-13 LAB — TSH: TSH: 3.76 u[IU]/mL (ref 0.450–4.500)

## 2015-10-13 LAB — HEMOGLOBIN A1C
Est. average glucose Bld gHb Est-mCnc: 186 mg/dL
Hgb A1c MFr Bld: 8.1 % — ABNORMAL HIGH (ref 4.8–5.6)

## 2015-10-14 ENCOUNTER — Encounter: Payer: Self-pay | Admitting: Internal Medicine

## 2015-10-14 ENCOUNTER — Ambulatory Visit (INDEPENDENT_AMBULATORY_CARE_PROVIDER_SITE_OTHER): Payer: Medicare Other | Admitting: Internal Medicine

## 2015-10-14 VITALS — BP 158/60 | HR 72 | Temp 97.5°F | Ht 68.0 in | Wt 150.0 lb

## 2015-10-14 DIAGNOSIS — R0781 Pleurodynia: Secondary | ICD-10-CM

## 2015-10-14 DIAGNOSIS — F0150 Vascular dementia without behavioral disturbance: Secondary | ICD-10-CM

## 2015-10-14 DIAGNOSIS — E034 Atrophy of thyroid (acquired): Secondary | ICD-10-CM

## 2015-10-14 DIAGNOSIS — I10 Essential (primary) hypertension: Secondary | ICD-10-CM

## 2015-10-14 DIAGNOSIS — E038 Other specified hypothyroidism: Secondary | ICD-10-CM

## 2015-10-14 DIAGNOSIS — E1121 Type 2 diabetes mellitus with diabetic nephropathy: Secondary | ICD-10-CM | POA: Insufficient documentation

## 2015-10-14 DIAGNOSIS — I5032 Chronic diastolic (congestive) heart failure: Secondary | ICD-10-CM | POA: Diagnosis not present

## 2015-10-14 MED ORDER — METOPROLOL TARTRATE 75 MG PO TABS: 75.0000 mg | ORAL_TABLET | Freq: Two times a day (BID) | ORAL | Status: DC

## 2015-10-14 MED ORDER — GLUCOSE BLOOD VI STRP: ORAL_STRIP | Status: DC

## 2015-10-14 MED ORDER — DONEPEZIL HCL 10 MG PO TABS: 10.0000 mg | ORAL_TABLET | Freq: Every day | ORAL | Status: DC

## 2015-10-14 MED ORDER — ACCU-CHEK SOFT TOUCH LANCETS MISC: Status: DC

## 2015-10-14 NOTE — Progress Notes (Signed)
Patient ID: Nicole [name], female   DOB: [birth year], 82 y.o.   MRN: [id number]   Location:  [facility] clinic Provider:  Juna Caban L. Mariea Clonts, D.O., C.M.D.  Code Status: DNR Goals of Care:  Advanced Directives 10/14/2015  Does patient have an advance directive? Yes  Type of Paramedic of [place];Living will  Does patient want to make changes to advanced directive? -  Copy of advanced directive(s) in chart? Yes     Chief Complaint  Patient presents with  . Medical Management of Chronic Issues    2 month follow-up for BP, left foot pain    HPI: Patient is a 82 y.o. female seen today for medical management of chronic diseases.    Left great toe had biopsy of darkened area.  It was negative.  She reports that her right great toe is sore when people brush up against it while she is lying in bed.  A little bit tingly.   DMII has worsened--now at 8.1.  Readings at home have been elevated per caregiver.  Not eating regular meals though so meds not a good idea.  Is eating candy and cokes/pepsis.  These also increase her left ankle swelling.    Right side over ribs hurts when she turns.  Heat does help.  Doesn't do it often.    BP at home 160s today.  Here elevated also.  No headaches or dizziness on standing.  Past Medical History  Diagnosis Date  . Colon cancer ([place]) 07/1997  . Hypertension   . Diabetes mellitus   . CHF (congestive heart failure) ([place])   . AAA (abdominal aortic aneurysm) ([place])   . Atrial fibrillation ([place])   . Hypercholesteremia   . Kidney stone     renal calculi  . Small bowel obstruction due to adhesions ([place]) 05/16/2012  . Hypothyroidism   . Arthritis   . Anemia   . Cholelithiasis   . Perforation of bile duct   . Hemorrhage of rectum and anus   . Swelling, mass, or lump in head and neck   . Cervicalgia   . Hypoglycemia, unspecified   . Pain in joint, lower leg   . Anxiety   . Shortness of breath   . Acute upper respiratory infections  of unspecified site   . Depression   . Thoracic aneurysm without mention of rupture   . GERD (gastroesophageal reflux disease)   . Proteinuria   . Other specified cardiac dysrhythmias(427.89)   . Congestive heart failure, unspecified   . Electrolyte and fluid disorders not elsewhere classified   . Other chronic allergic conjunctivitis   . Hypopotassemia   . Chronic systolic heart failure ([place])   . Dizziness and giddiness   . Malignant neoplasm of colon, unspecified site   . Other and unspecified hyperlipidemia   . Gout, unspecified   . Atrial fibrillation ([place])   . Aortic aneurysm of unspecified site without mention of rupture   . Pain in joint, site unspecified   . Palpitations   . CKD (chronic kidney disease)   . Diabetes mellitus ([place])   . Stroke [facility])     Past Surgical History  Procedure Laterality Date  . Hemicolectomy  08/11/1997  . Kidney stone surgery    . Colon surgery      to remove colon ca  . Cholecystectomy  05/14/2012    Procedure: LAPAROSCOPIC CHOLECYSTECTOMY WITH INTRAOPERATIVE CHOLANGIOGRAM;  Surgeon: Haywood Lasso, MD;  Location: [facility];  Service: General;  Laterality:  N/A;  . Ercp  05/17/2012    Procedure: ENDOSCOPIC RETROGRADE CHOLANGIOPANCREATOGRAPHY (ERCP);  Surgeon: Missy Sabins, MD;  Location: [facility];  Service: Gastroenterology;  Laterality: N/A;  . Ercp  08/12/2012    Procedure: ENDOSCOPIC RETROGRADE CHOLANGIOPANCREATOGRAPHY (ERCP);  Surgeon: Missy Sabins, MD;  Location: [facility] ENDOSCOPY;  Service: Endoscopy;  Laterality: N/A;  . Transthoracic echocardiogram  11/2009    EF 45-50%, mild-mod AV regurg; mild MR; mod TR; ascending aorta mildly dilated  . Cardiac catheterization  2008    moderate severe pulm HTN; with elevted pulm capillary wedge pressure     Allergies  Allergen Reactions  . Iohexol      Code: VOM, Desc: PT REPORTS VOMITING W/ IVP DYE- ARS 12/26/08, Onset Date: [id number]   . Z-Pak [Azithromycin]   . Iodinated Diagnostic Agents Nausea Only     PT HAS HAD SINGULAR INCIDENT OF NAUSEA  W/ IV CONTYRAST INJECTION, NONE IF INJECTED SLOWLY//A.CALHOUN      Medication List       This list is accurate as of: 10/14/15 11:13 AM.  Always use your most recent med list.               ACCU-CHEK AVIVA Soln  See admin instructions.     allopurinol 100 MG tablet  Commonly known as:  ZYLOPRIM  Take 1 tablet (100 mg total) by mouth daily.     aspirin EC 81 MG tablet  Take 81 mg by mouth daily.     blood glucose meter kit and supplies  Dispense based on patient and insurance preference. Use up to four times daily as directed. (FOR ICD-9 250.00, 250.01).     cloNIDine 0.2 MG tablet  Commonly known as:  CATAPRES  Take one tablet by mouth three times daily for hypertension     donepezil 5 MG tablet  Commonly known as:  ARICEPT  Take 1 tablet (5 mg total) by mouth at bedtime. Start one tablet daily x 4 weeks then two tablets daily     feeding supplement (ENSURE ENLIVE) Liqd  Take 237 mLs by mouth 2 (two) times daily between meals.     furosemide 20 MG tablet  Commonly known as:  LASIX  Take 1 tablet (20 mg total) by mouth daily as needed (take for more than 2 Lb weight gain in a day or pedal edema along with potassium).     glucose blood test strip  Commonly known as:  ACCU-CHEK AVIVA  Use to check blood sugar three times daily. Dx: E11.21     levothyroxine 50 MCG tablet  Commonly known as:  SYNTHROID, LEVOTHROID  Take one tablet by mouth 30 minutes before breakfast away from other medications for thyroid     lisinopril 20 MG tablet  Commonly known as:  PRINIVIL,ZESTRIL  Take one tablet by mouth twice daily for hypertension     MELATIN PO  Take 1 tablet by mouth at bedtime as needed. For sleep     metoprolol 50 MG tablet  Commonly known as:  LOPRESSOR  Take 1 tablet (50 mg total) by mouth 2 (two) times daily.     potassium chloride 10 MEQ tablet  Commonly known as:  K-DUR  Take 2 tablets (20 mEq total) by mouth daily  as needed (take with lasix only.).     PROTONIX 40 mg/20 mL Pack  Generic drug:  pantoprazole sodium  Take 40 mg by mouth daily.     senna-docusate 8.6-50 MG tablet  Commonly known  as:  Senokot-S  Take 1 tablet by mouth 2 (two) times daily.        Review of Systems:  Review of Systems  Constitutional: Negative for fever, chills and malaise/fatigue.  Respiratory: Negative for shortness of breath.   Cardiovascular: Negative for chest pain and leg swelling.  Gastrointestinal: Negative for abdominal pain.  Genitourinary: Negative for dysuria.  Musculoskeletal: Positive for myalgias.  Neurological: Positive for tingling, sensory change and weakness. Negative for dizziness and headaches.  Endo/Heme/Allergies:       Diabetes control worsened  Psychiatric/Behavioral: Positive for memory loss.    Health Maintenance  Topic Date Due  . OPHTHALMOLOGY EXAM  03/22/1943  . ZOSTAVAX  03/21/1993  . INFLUENZA VACCINE  02/21/2016  . FOOT EXAM  02/24/2016  . HEMOGLOBIN A1C  04/13/2016  . MAMMOGRAM  09/08/2016  . TETANUS/TDAP  09/16/2021  . DEXA SCAN  Completed  . PNA vac Low Risk Adult  Completed    Physical Exam: Filed Vitals:   10/14/15 1023  BP: 158/60  Pulse: 72  Temp: 97.5 F (36.4 C)  TempSrc: Oral  Height: 5' 8"  (1.727 m)  Weight: 150 lb (68.04 kg)  SpO2: 95%   Body mass index is 22.81 kg/(m^2). Physical Exam  Constitutional: She appears well-nourished. No distress.  Cardiovascular: Intact distal pulses.   irreg irreg  Pulmonary/Chest: Effort normal and breath sounds normal. No respiratory distress. She has no wheezes. She has no rales.  Abdominal: Bowel sounds are normal.  Musculoskeletal:  Walking w/o assistive device, is unsteady  Neurological: She is alert.  Skin: Skin is warm and dry. No rash noted.  Psychiatric: She has a normal mood and affect.    Labs reviewed: Basic Metabolic Panel:  Recent Labs  04/05/15 0819  06/13/15 1524  07/28/15 0520  07/29/15 0411 10/12/15 0928  NA 144  < >  --   < > 139 141 139  K 3.3*  < >  --   < > 4.1 3.8 4.0  CL 110  < >  --   < > 107 103 100  CO2 26  < >  --   < > 25 27 23   GLUCOSE 139*  < >  --   < > 121* 134* 120*  BUN 10  < >  --   < > 15 15 12   CREATININE 1.10*  < >  --   < > 1.24* 1.35* 1.13*  CALCIUM 8.9  < >  --   < > 8.7* 9.0 9.1  TSH 2.053  --  2.210  --   --   --  3.760  < > = values in this interval not displayed. Liver Function Tests:  Recent Labs  04/04/15 0945 04/13/15 0546  AST 22 13*  ALT 10* 8*  ALKPHOS 91 71  BILITOT 1.0 0.8  PROT 7.8 6.4*  ALBUMIN 3.6 2.5*   No results for input(s): LIPASE, AMYLASE in the last 8760 hours. No results for input(s): AMMONIA in the last 8760 hours. CBC:  Recent Labs  04/13/15 0546  06/29/15 0938  07/27/15 2008 07/28/15 0520 07/29/15 0411 10/12/15 0928  WBC 8.7  < > 8.8  --  8.5 7.9 9.0 7.2  NEUTROABS 6.7  --  5.8  --   --   --   --  4.7  HGB 11.7*  < >  --   < > 8.6* 8.4* 10.2*  --   HCT 34.2*  < > 25.4*  < > 27.0* 25.6*  30.2* 33.4*  MCV 76.5*  < > 79  --  68.0* 68.3* 66.5* 65*  PLT 297  < > 441*  --  455* 456* 511* 529*  < > = values in this interval not displayed. Lipid Panel:  Recent Labs  02/17/15 0810 04/05/15 0819 10/12/15 0928  CHOL 176 161 142  HDL 70 43 47  LDLCALC 92 97 79  TRIG 69 104 82  CHOLHDL 2.5 3.7 3.0   Lab Results  Component Value Date   HGBA1C 8.1* 10/12/2015   Assessment/Plan 1. Vascular dementia, without behavioral disturbance - cont her aricept -encouraged stimulation, ADHC, etc to keep her active - donepezil (ARICEPT) 10 MG tablet; Take 1 tablet (10 mg total) by mouth at bedtime.  Dispense: 30 tablet; Refill: 3  2. Type 2 diabetes mellitus with diabetic nephropathy, without long-term current use of insulin (HCC) - worsened--discussed changes to make to lower this number just due to her prior stroke - Hemoglobin A1c; Future - Basic metabolic panel; Future  3. Hypothyroidism due to  acquired atrophy of thyroid -cont synthroid at current dose and monitor  4. Essential hypertension - bp has been above goal at home and here so increase metoprolol from 9m to 772mand see how that works - metoprolol 75 MG TABS; Take 75 mg by mouth 2 (two) times daily.  Dispense: 60 tablet; Refill: 3  5. Chronic diastolic congestive heart failure (HCC) - no s/s of acute chf -cont current regimen, avoiding high sodium foods, elevating feet at rest  6. Rib pain on right side -seems to be muscular in origin -discussed stretching and trying otc rubs over the area since heat has not been working, perhaps biofreeze or ice  Labs/tests ordered:   Orders Placed This Encounter  Procedures  . Hemoglobin A1c    Standing Status: Future     Number of Occurrences:      Standing Expiration Date: 04/15/2016  . Basic metabolic panel    Standing Status: Future     Number of Occurrences:      Standing Expiration Date: 04/15/2016    Next appt:  01/16/2016 for med mgt  Cecila Satcher L. Jenniger Figiel, D.O. [facility] [address] [address] Phone (Mon-Fri 8am-5pm):  [phone] Call:  [phone] follow prompts after 5pm & weekends Office Phone:  [phone] Fax:  [phone]

## 2015-10-18 ENCOUNTER — Telehealth: Payer: Self-pay

## 2015-10-18 DIAGNOSIS — E1121 Type 2 diabetes mellitus with diabetic nephropathy: Secondary | ICD-10-CM

## 2015-10-18 MED ORDER — ACCU-CHEK SOFT TOUCH LANCETS MISC: Status: DC

## 2015-10-18 MED ORDER — GLUCOSE BLOOD VI STRP: ORAL_STRIP | Status: DC

## 2015-10-18 NOTE — Telephone Encounter (Signed)
Received a fax from [employer] requesting a Physician Order Authorization for diabetic testing supplies to be home delivered to patient.   I called patient to verify that she received supplies from this company. Patient stated that she would have her caregiver to call the office with the name of company that delivers her needed supplies.

## 2015-10-18 NOTE — Telephone Encounter (Signed)
[name] called and stated that patient uses [employer] for her supplies. Patient does need refill, faxed Rx to pharmacy. Patient DOES NOT use the [facility]. Please shred paperwork.

## 2015-10-27 ENCOUNTER — Telehealth: Payer: Self-pay | Admitting: *Deleted

## 2015-10-27 NOTE — Telephone Encounter (Signed)
Received fax from Dayna Barker Day [phone] Fax: [phone] requesting orders for PT and copy of last OV note. Printed and attached and given to Dr. Mariea Clonts to review and sign.

## 2015-10-30 DIAGNOSIS — E1122 Type 2 diabetes mellitus with diabetic chronic kidney disease: Secondary | ICD-10-CM | POA: Diagnosis not present

## 2015-10-30 DIAGNOSIS — F0150 Vascular dementia without behavioral disturbance: Secondary | ICD-10-CM | POA: Diagnosis not present

## 2015-10-30 DIAGNOSIS — I130 Hypertensive heart and chronic kidney disease with heart failure and stage 1 through stage 4 chronic kidney disease, or unspecified chronic kidney disease: Secondary | ICD-10-CM | POA: Diagnosis not present

## 2015-10-30 DIAGNOSIS — Z9181 History of falling: Secondary | ICD-10-CM | POA: Diagnosis not present

## 2015-10-30 DIAGNOSIS — E038 Other specified hypothyroidism: Secondary | ICD-10-CM | POA: Diagnosis not present

## 2015-10-30 DIAGNOSIS — Z7982 Long term (current) use of aspirin: Secondary | ICD-10-CM | POA: Diagnosis not present

## 2015-10-30 DIAGNOSIS — I5042 Chronic combined systolic (congestive) and diastolic (congestive) heart failure: Secondary | ICD-10-CM | POA: Diagnosis not present

## 2015-10-30 DIAGNOSIS — M79671 Pain in right foot: Secondary | ICD-10-CM | POA: Diagnosis not present

## 2015-10-30 DIAGNOSIS — N189 Chronic kidney disease, unspecified: Secondary | ICD-10-CM | POA: Diagnosis not present

## 2015-10-30 DIAGNOSIS — M6281 Muscle weakness (generalized): Secondary | ICD-10-CM | POA: Diagnosis not present

## 2015-10-30 DIAGNOSIS — E034 Atrophy of thyroid (acquired): Secondary | ICD-10-CM | POA: Diagnosis not present

## 2015-10-31 ENCOUNTER — Other Ambulatory Visit: Payer: Self-pay

## 2015-10-31 MED ORDER — LEVOTHYROXINE SODIUM 50 MCG PO TABS: ORAL_TABLET | ORAL | Status: DC

## 2015-11-01 DIAGNOSIS — E1142 Type 2 diabetes mellitus with diabetic polyneuropathy: Secondary | ICD-10-CM | POA: Diagnosis not present

## 2015-11-01 DIAGNOSIS — I5042 Chronic combined systolic (congestive) and diastolic (congestive) heart failure: Secondary | ICD-10-CM | POA: Diagnosis not present

## 2015-11-01 DIAGNOSIS — M6281 Muscle weakness (generalized): Secondary | ICD-10-CM | POA: Diagnosis not present

## 2015-11-01 DIAGNOSIS — N189 Chronic kidney disease, unspecified: Secondary | ICD-10-CM | POA: Diagnosis not present

## 2015-11-01 DIAGNOSIS — E034 Atrophy of thyroid (acquired): Secondary | ICD-10-CM | POA: Diagnosis not present

## 2015-11-01 DIAGNOSIS — Z7982 Long term (current) use of aspirin: Secondary | ICD-10-CM | POA: Diagnosis not present

## 2015-11-01 DIAGNOSIS — E038 Other specified hypothyroidism: Secondary | ICD-10-CM | POA: Diagnosis not present

## 2015-11-01 DIAGNOSIS — Z9181 History of falling: Secondary | ICD-10-CM | POA: Diagnosis not present

## 2015-11-01 DIAGNOSIS — E1122 Type 2 diabetes mellitus with diabetic chronic kidney disease: Secondary | ICD-10-CM | POA: Diagnosis not present

## 2015-11-01 DIAGNOSIS — I130 Hypertensive heart and chronic kidney disease with heart failure and stage 1 through stage 4 chronic kidney disease, or unspecified chronic kidney disease: Secondary | ICD-10-CM | POA: Diagnosis not present

## 2015-11-01 DIAGNOSIS — M79671 Pain in right foot: Secondary | ICD-10-CM | POA: Diagnosis not present

## 2015-11-08 DIAGNOSIS — I739 Peripheral vascular disease, unspecified: Secondary | ICD-10-CM | POA: Diagnosis not present

## 2015-11-08 DIAGNOSIS — M2570 Osteophyte, unspecified joint: Secondary | ICD-10-CM | POA: Diagnosis not present

## 2015-11-08 DIAGNOSIS — L603 Nail dystrophy: Secondary | ICD-10-CM | POA: Diagnosis not present

## 2015-11-08 DIAGNOSIS — L84 Corns and callosities: Secondary | ICD-10-CM | POA: Diagnosis not present

## 2015-11-08 DIAGNOSIS — E1151 Type 2 diabetes mellitus with diabetic peripheral angiopathy without gangrene: Secondary | ICD-10-CM | POA: Diagnosis not present

## 2015-11-09 DIAGNOSIS — E034 Atrophy of thyroid (acquired): Secondary | ICD-10-CM | POA: Diagnosis not present

## 2015-11-09 DIAGNOSIS — I130 Hypertensive heart and chronic kidney disease with heart failure and stage 1 through stage 4 chronic kidney disease, or unspecified chronic kidney disease: Secondary | ICD-10-CM | POA: Diagnosis not present

## 2015-11-09 DIAGNOSIS — E1122 Type 2 diabetes mellitus with diabetic chronic kidney disease: Secondary | ICD-10-CM | POA: Diagnosis not present

## 2015-11-09 DIAGNOSIS — Z9181 History of falling: Secondary | ICD-10-CM | POA: Diagnosis not present

## 2015-11-09 DIAGNOSIS — I5042 Chronic combined systolic (congestive) and diastolic (congestive) heart failure: Secondary | ICD-10-CM | POA: Diagnosis not present

## 2015-11-09 DIAGNOSIS — M6281 Muscle weakness (generalized): Secondary | ICD-10-CM | POA: Diagnosis not present

## 2015-11-09 DIAGNOSIS — N189 Chronic kidney disease, unspecified: Secondary | ICD-10-CM | POA: Diagnosis not present

## 2015-11-09 DIAGNOSIS — M79671 Pain in right foot: Secondary | ICD-10-CM | POA: Diagnosis not present

## 2015-11-09 DIAGNOSIS — E038 Other specified hypothyroidism: Secondary | ICD-10-CM | POA: Diagnosis not present

## 2015-11-09 DIAGNOSIS — Z7982 Long term (current) use of aspirin: Secondary | ICD-10-CM | POA: Diagnosis not present

## 2015-11-11 DIAGNOSIS — E1122 Type 2 diabetes mellitus with diabetic chronic kidney disease: Secondary | ICD-10-CM | POA: Diagnosis not present

## 2015-11-11 DIAGNOSIS — Z9181 History of falling: Secondary | ICD-10-CM | POA: Diagnosis not present

## 2015-11-11 DIAGNOSIS — N189 Chronic kidney disease, unspecified: Secondary | ICD-10-CM | POA: Diagnosis not present

## 2015-11-11 DIAGNOSIS — M6281 Muscle weakness (generalized): Secondary | ICD-10-CM | POA: Diagnosis not present

## 2015-11-11 DIAGNOSIS — E038 Other specified hypothyroidism: Secondary | ICD-10-CM | POA: Diagnosis not present

## 2015-11-11 DIAGNOSIS — Z7982 Long term (current) use of aspirin: Secondary | ICD-10-CM | POA: Diagnosis not present

## 2015-11-11 DIAGNOSIS — I130 Hypertensive heart and chronic kidney disease with heart failure and stage 1 through stage 4 chronic kidney disease, or unspecified chronic kidney disease: Secondary | ICD-10-CM | POA: Diagnosis not present

## 2015-11-11 DIAGNOSIS — M79671 Pain in right foot: Secondary | ICD-10-CM | POA: Diagnosis not present

## 2015-11-11 DIAGNOSIS — E034 Atrophy of thyroid (acquired): Secondary | ICD-10-CM | POA: Diagnosis not present

## 2015-11-11 DIAGNOSIS — I5042 Chronic combined systolic (congestive) and diastolic (congestive) heart failure: Secondary | ICD-10-CM | POA: Diagnosis not present

## 2015-11-15 ENCOUNTER — Other Ambulatory Visit: Payer: Self-pay | Admitting: *Deleted

## 2015-11-15 DIAGNOSIS — E034 Atrophy of thyroid (acquired): Secondary | ICD-10-CM | POA: Diagnosis not present

## 2015-11-15 DIAGNOSIS — E1122 Type 2 diabetes mellitus with diabetic chronic kidney disease: Secondary | ICD-10-CM | POA: Diagnosis not present

## 2015-11-15 DIAGNOSIS — M79671 Pain in right foot: Secondary | ICD-10-CM | POA: Diagnosis not present

## 2015-11-15 DIAGNOSIS — Z9181 History of falling: Secondary | ICD-10-CM | POA: Diagnosis not present

## 2015-11-15 DIAGNOSIS — E038 Other specified hypothyroidism: Secondary | ICD-10-CM | POA: Diagnosis not present

## 2015-11-15 DIAGNOSIS — I5042 Chronic combined systolic (congestive) and diastolic (congestive) heart failure: Secondary | ICD-10-CM | POA: Diagnosis not present

## 2015-11-15 DIAGNOSIS — M6281 Muscle weakness (generalized): Secondary | ICD-10-CM | POA: Diagnosis not present

## 2015-11-15 DIAGNOSIS — Z7982 Long term (current) use of aspirin: Secondary | ICD-10-CM | POA: Diagnosis not present

## 2015-11-15 DIAGNOSIS — I130 Hypertensive heart and chronic kidney disease with heart failure and stage 1 through stage 4 chronic kidney disease, or unspecified chronic kidney disease: Secondary | ICD-10-CM | POA: Diagnosis not present

## 2015-11-15 DIAGNOSIS — N189 Chronic kidney disease, unspecified: Secondary | ICD-10-CM | POA: Diagnosis not present

## 2015-11-15 MED ORDER — POTASSIUM CHLORIDE ER 10 MEQ PO TBCR: 20.0000 meq | EXTENDED_RELEASE_TABLET | Freq: Every day | ORAL | Status: DC | PRN

## 2015-11-15 NOTE — Telephone Encounter (Signed)
[facility] 

## 2015-11-17 DIAGNOSIS — N189 Chronic kidney disease, unspecified: Secondary | ICD-10-CM | POA: Diagnosis not present

## 2015-11-17 DIAGNOSIS — E034 Atrophy of thyroid (acquired): Secondary | ICD-10-CM | POA: Diagnosis not present

## 2015-11-17 DIAGNOSIS — M79671 Pain in right foot: Secondary | ICD-10-CM | POA: Diagnosis not present

## 2015-11-17 DIAGNOSIS — E1122 Type 2 diabetes mellitus with diabetic chronic kidney disease: Secondary | ICD-10-CM | POA: Diagnosis not present

## 2015-11-17 DIAGNOSIS — Z7982 Long term (current) use of aspirin: Secondary | ICD-10-CM | POA: Diagnosis not present

## 2015-11-17 DIAGNOSIS — I5042 Chronic combined systolic (congestive) and diastolic (congestive) heart failure: Secondary | ICD-10-CM | POA: Diagnosis not present

## 2015-11-17 DIAGNOSIS — M6281 Muscle weakness (generalized): Secondary | ICD-10-CM | POA: Diagnosis not present

## 2015-11-17 DIAGNOSIS — Z9181 History of falling: Secondary | ICD-10-CM | POA: Diagnosis not present

## 2015-11-17 DIAGNOSIS — I130 Hypertensive heart and chronic kidney disease with heart failure and stage 1 through stage 4 chronic kidney disease, or unspecified chronic kidney disease: Secondary | ICD-10-CM | POA: Diagnosis not present

## 2015-11-17 DIAGNOSIS — E038 Other specified hypothyroidism: Secondary | ICD-10-CM | POA: Diagnosis not present

## 2015-11-23 DIAGNOSIS — M79671 Pain in right foot: Secondary | ICD-10-CM | POA: Diagnosis not present

## 2015-11-23 DIAGNOSIS — N189 Chronic kidney disease, unspecified: Secondary | ICD-10-CM | POA: Diagnosis not present

## 2015-11-23 DIAGNOSIS — E034 Atrophy of thyroid (acquired): Secondary | ICD-10-CM | POA: Diagnosis not present

## 2015-11-23 DIAGNOSIS — I130 Hypertensive heart and chronic kidney disease with heart failure and stage 1 through stage 4 chronic kidney disease, or unspecified chronic kidney disease: Secondary | ICD-10-CM | POA: Diagnosis not present

## 2015-11-23 DIAGNOSIS — I5042 Chronic combined systolic (congestive) and diastolic (congestive) heart failure: Secondary | ICD-10-CM | POA: Diagnosis not present

## 2015-11-23 DIAGNOSIS — Z9181 History of falling: Secondary | ICD-10-CM | POA: Diagnosis not present

## 2015-11-23 DIAGNOSIS — M6281 Muscle weakness (generalized): Secondary | ICD-10-CM | POA: Diagnosis not present

## 2015-11-23 DIAGNOSIS — E1122 Type 2 diabetes mellitus with diabetic chronic kidney disease: Secondary | ICD-10-CM | POA: Diagnosis not present

## 2015-11-23 DIAGNOSIS — Z7982 Long term (current) use of aspirin: Secondary | ICD-10-CM | POA: Diagnosis not present

## 2015-11-23 DIAGNOSIS — E038 Other specified hypothyroidism: Secondary | ICD-10-CM | POA: Diagnosis not present

## 2015-11-25 DIAGNOSIS — I130 Hypertensive heart and chronic kidney disease with heart failure and stage 1 through stage 4 chronic kidney disease, or unspecified chronic kidney disease: Secondary | ICD-10-CM | POA: Diagnosis not present

## 2015-11-25 DIAGNOSIS — N189 Chronic kidney disease, unspecified: Secondary | ICD-10-CM | POA: Diagnosis not present

## 2015-11-25 DIAGNOSIS — E1122 Type 2 diabetes mellitus with diabetic chronic kidney disease: Secondary | ICD-10-CM | POA: Diagnosis not present

## 2015-11-25 DIAGNOSIS — E034 Atrophy of thyroid (acquired): Secondary | ICD-10-CM | POA: Diagnosis not present

## 2015-11-25 DIAGNOSIS — I5042 Chronic combined systolic (congestive) and diastolic (congestive) heart failure: Secondary | ICD-10-CM | POA: Diagnosis not present

## 2015-11-25 DIAGNOSIS — Z7982 Long term (current) use of aspirin: Secondary | ICD-10-CM | POA: Diagnosis not present

## 2015-11-25 DIAGNOSIS — M6281 Muscle weakness (generalized): Secondary | ICD-10-CM | POA: Diagnosis not present

## 2015-11-25 DIAGNOSIS — E038 Other specified hypothyroidism: Secondary | ICD-10-CM | POA: Diagnosis not present

## 2015-11-25 DIAGNOSIS — Z9181 History of falling: Secondary | ICD-10-CM | POA: Diagnosis not present

## 2015-11-25 DIAGNOSIS — M79671 Pain in right foot: Secondary | ICD-10-CM | POA: Diagnosis not present

## 2015-12-08 ENCOUNTER — Other Ambulatory Visit: Payer: Self-pay | Admitting: *Deleted

## 2015-12-08 MED ORDER — ALLOPURINOL 100 MG PO TABS: 100.0000 mg | ORAL_TABLET | Freq: Every day | ORAL | Status: DC

## 2015-12-08 NOTE — Telephone Encounter (Signed)
[facility] 

## 2015-12-09 ENCOUNTER — Encounter: Payer: Self-pay | Admitting: Internal Medicine

## 2015-12-09 ENCOUNTER — Ambulatory Visit (INDEPENDENT_AMBULATORY_CARE_PROVIDER_SITE_OTHER): Payer: Medicare Other | Admitting: Internal Medicine

## 2015-12-09 VITALS — BP 126/64 | HR 55 | Ht 70.0 in | Wt 152.0 lb

## 2015-12-09 DIAGNOSIS — F0151 Vascular dementia with behavioral disturbance: Secondary | ICD-10-CM | POA: Diagnosis not present

## 2015-12-09 DIAGNOSIS — I712 Thoracic aortic aneurysm, without rupture, unspecified: Secondary | ICD-10-CM

## 2015-12-09 DIAGNOSIS — I481 Persistent atrial fibrillation: Secondary | ICD-10-CM | POA: Diagnosis not present

## 2015-12-09 DIAGNOSIS — I4819 Other persistent atrial fibrillation: Secondary | ICD-10-CM

## 2015-12-09 DIAGNOSIS — I5032 Chronic diastolic (congestive) heart failure: Secondary | ICD-10-CM

## 2015-12-09 NOTE — Patient Instructions (Signed)
Your physician wants you to follow-up in: 6 Months You will receive a reminder letter in the mail two months in advance. If you don't receive a letter, please call our office to schedule the follow-up appointment.  

## 2015-12-09 NOTE — Progress Notes (Signed)
OFFICE NOTE  Chief Complaint:  Routine follow-up  Primary Care Physician: Hollace Kinnier, DO  HPI:  Nicole [name] is a 82 year old female with history of chronic atrial fibrillation and controlled ventricular response. She was previously followed by Dr. Rex Kras, has a history of congestive heart failure with an EF of about 45% and moderate MR in the past. She has a thoracic aneurysm which has been asymptomatic, as well as hypertension, diabetes, dyslipidemia, and recently was admitted for gallbladder surgery which was complicated by bleeding issues. She has been deemed not a good candidate for Coumadin due to a number of issues with compliance by Dr. Rex Kras, and has been on aspirin and Plavix. Unfortunately, with regard to the Plavix she had complicated bleeding postoperatively from her gallbladder surgery and Plavix was discontinued due to this. It turns out that there is little benefit to it additional Plavix on top of aspirin for stroke reduction. As far as her stroke risk is concerned, it is actually fairly high considering she is in chronic atrial fibrillation and does have a history of hypertension and diabetes and is a female with a CHADS2 score of 3, a CHADS VASc score of 3-1/2 or 4.  Based on this, I recommended starting her on Eliquis.   Nicole [name] returns today for followup. Unfortunately she was just in the hospital with epistaxis. This is following a period of time where she developed small bowel obstruction and had a nasogastric tube in place. This could contribute to possible erosions. She was seen by an ear nose and throat specialist to placed a nasal balloon and ultimately packed her nose. This packing was in for a week and was removed and no further bleeding was noted. It was not mentioned what the cause of her bleeding was. She was on adequate. She still has a high CHADSVASC score. We had recommended discontinuing her eloquence due to bleeding. She was also in A. fib with RVR and  I recommended increasing her diltiazem to 240 mg daily but that was not performed. She's had no further epistaxis.  I saw Nicole [name] back today in the office. Overall she reports feeling very well. Unfortunate she's recently been having significant falls. She says she's been falling out of bed amongst other things and eventually developed significant bruising and has a large resolving hematoma on her right chin. Fortunately there was no intracranial bleeding. She is maintained on warfarin but is not had a check of her INR since March. Her last check in our office was in December. It's unclear why she felt that she did not need to come back for ongoing monthly INR checks. This of warfarin, but fortunately her INR today is 2.7. She remains in atrial fibrillation with a CHADSVASC score of 4. She also has a history of thoracic aortic aneurysm measuring up to 4.8 cm. This is been stable and recently when she had a neck CT, this is partially visualized and also measured 4.8 cm.   Nicole [name] returns today for hospital follow-up. Unfortunately in September she was hospitalized after acute confusion. Seen in the ER a CT scan indicated an acute intracerebral hemorrhage. This was significant with mass effect. Neurology and neurosurgery were consult it. She was on warfarin however INR was just above 3. This was urgently reversed. Blood pressure however was elevated at over 160/120. It was thought that this bleeding was due to hypertensive emergency. Subsequently she had a long hospitalization and rehabilitation. She was taken off of warfarin and recently  followed up with her neurologist , who suggested she could go on low-dose aspirin. She understands after and explanation by me today that the risk of ischemic stroke is still quite high.  Her CHADSVASC or is at least 6.  We did spend some time discussing an alternative which may be the watchman left atrial appendage occluder device.  12/09/2015  Nicole [name] was seen  back today in follow-up. Unfortunately she's had bleeding on anticoagulation and is at high risk for anticoagulation going forward. I had a discussion with her neurologist Dr. Leonie Man regarding the possibility of the watchman and left atrial appendage occluder device. The concern is that she would have to be on 2 months of anticoagulation which he did not feel that she would tolerate. Therefore she is deemed not an anticoagulation candidate and if she is not a watchman candidate would remain on aspirin but at high risk for stroke. Heart failure seems to be well compensated. Blood pressure is at goal weight is stable.  PMHx:   Past Medical History  Diagnosis Date  . Colon cancer ([place]) 07/1997  . Hypertension   . Diabetes mellitus   . CHF (congestive heart failure) ([place])   . AAA (abdominal aortic aneurysm) ([place])   . Atrial fibrillation ([place])   . Hypercholesteremia   . Kidney stone     renal calculi  . Small bowel obstruction due to adhesions ([place]) 05/16/2012  . Hypothyroidism   . Arthritis   . Anemia   . Cholelithiasis   . Perforation of bile duct   . Hemorrhage of rectum and anus   . Swelling, mass, or lump in head and neck   . Cervicalgia   . Hypoglycemia, unspecified   . Pain in joint, lower leg   . Anxiety   . Shortness of breath   . Acute upper respiratory infections of unspecified site   . Depression   . Thoracic aneurysm without mention of rupture   . GERD (gastroesophageal reflux disease)   . Proteinuria   . Other specified cardiac dysrhythmias(427.89)   . Congestive heart failure, unspecified   . Electrolyte and fluid disorders not elsewhere classified   . Other chronic allergic conjunctivitis   . Hypopotassemia   . Chronic systolic heart failure ([place])   . Dizziness and giddiness   . Malignant neoplasm of colon, unspecified site   . Other and unspecified hyperlipidemia   . Gout, unspecified   . Atrial fibrillation ([place])   . Aortic aneurysm of unspecified site without  mention of rupture   . Pain in joint, site unspecified   . Palpitations   . CKD (chronic kidney disease)   . Diabetes mellitus ([place])   . Stroke [facility])     Past Surgical History  Procedure Laterality Date  . Hemicolectomy  08/11/1997  . Kidney stone surgery    . Colon surgery      to remove colon ca  . Cholecystectomy  05/14/2012    Procedure: LAPAROSCOPIC CHOLECYSTECTOMY WITH INTRAOPERATIVE CHOLANGIOGRAM;  Surgeon: Haywood Lasso, MD;  Location: [place];  Service: General;  Laterality: N/A;  . Ercp  05/17/2012    Procedure: ENDOSCOPIC RETROGRADE CHOLANGIOPANCREATOGRAPHY (ERCP);  Surgeon: Missy Sabins, MD;  Location: [place];  Service: Gastroenterology;  Laterality: N/A;  . Ercp  08/12/2012    Procedure: ENDOSCOPIC RETROGRADE CHOLANGIOPANCREATOGRAPHY (ERCP);  Surgeon: Missy Sabins, MD;  Location[facility] ENDOSCOPY;  Service: Endoscopy;  Laterality: N/A;  . Transthoracic echocardiogram  11/2009    EF 45-50%, mild-mod AV  regurg; mild MR; mod TR; ascending aorta mildly dilated  . Cardiac catheterization  2008    moderate severe pulm HTN; with elevted pulm capillary wedge pressure     FAMHx:  Family History  Problem Relation Age of Onset  . Heart disease Mother   . Stroke Father   . Hypertension Daughter   . Hypertension Son   . Diabetes Sister   . Hypertension Son     SOCHx:   reports that she has never smoked. She has never used smokeless tobacco. She reports that she does not drink alcohol or use illicit drugs.  ALLERGIES:  Allergies  Allergen Reactions  . Iohexol      Code: VOM, Desc: PT REPORTS VOMITING W/ IVP DYE- ARS 12/26/08, Onset Date: 31121624   . Z-Pak [Azithromycin]   . Iodinated Diagnostic Agents Nausea Only    PT HAS HAD SINGULAR INCIDENT OF NAUSEA  W/ IV CONTYRAST INJECTION, NONE IF INJECTED SLOWLY//A.[place]    ROS: Pertinent items noted in HPI and remainder of comprehensive ROS otherwise negative.  HOME MEDS: Current Outpatient Prescriptions  Medication Sig  Dispense Refill  . allopurinol (ZYLOPRIM) 100 MG tablet Take 1 tablet (100 mg total) by mouth daily. 30 tablet 3  . aspirin EC 81 MG tablet Take 81 mg by mouth daily.    . Blood Glucose Calibration (ACCU-CHEK AVIVA) SOLN See admin instructions.  99  . blood glucose meter kit and supplies Dispense based on patient and insurance preference. Use up to four times daily as directed. (FOR ICD-9 250.00, 250.01). 1 each 0  . cloNIDine (CATAPRES) 0.2 MG tablet Take one tablet by mouth three times daily for hypertension 90 tablet 2  . donepezil (ARICEPT) 10 MG tablet Take 1 tablet (10 mg total) by mouth at bedtime. 30 tablet 3  . feeding supplement, ENSURE ENLIVE, (ENSURE ENLIVE) LIQD Take 237 mLs by mouth 2 (two) times daily between meals. 237 mL 12  . furosemide (LASIX) 20 MG tablet Take 1 tablet (20 mg total) by mouth daily as needed (take for more than 2 Lb weight gain in a day or pedal edema along with potassium). 30 tablet 0  . glucose blood (ACCU-CHEK AVIVA) test strip Use to check blood sugar daily. Dx: E11.21 100 each 12  . Lancets (ACCU-CHEK SOFT TOUCH) lancets Use as instructed 100 each 12  . levothyroxine (SYNTHROID, LEVOTHROID) 50 MCG tablet Take one tablet by mouth 30 minutes before breakfast away from other medications for thyroid 30 tablet 5  . lisinopril (PRINIVIL,ZESTRIL) 20 MG tablet Take one tablet by mouth twice daily for hypertension    . Melatonin-Pyridoxine (MELATIN PO) Take 1 tablet by mouth at bedtime as needed. For sleep    . metoprolol 75 MG TABS Take 75 mg by mouth 2 (two) times daily. 60 tablet 3  . pantoprazole sodium (PROTONIX) 40 mg/20 mL PACK Take 40 mg by mouth daily.     . potassium chloride (K-DUR) 10 MEQ tablet Take 2 tablets (20 mEq total) by mouth daily as needed (take with lasix only.). 30 tablet 1  . senna-docusate (SENOKOT-S) 8.6-50 MG tablet Take 1 tablet by mouth 2 (two) times daily. 30 tablet 0   No current facility-administered medications for this visit.     LABS/IMAGING: No results found for this or any previous visit (from the past 48 hour(s)). No results found.  VITALS: BP 126/64 mmHg  Pulse 55  Ht _0  (1.778 m)  Wt 152 lb (68.947 kg)  BMI 21.81 kg/m2  EXAM: General appearance: alert and no distress Neck: no carotid bruit, no JVD and . Lungs: clear to auscultation bilaterally Heart: irregularly irregular rhythm Abdomen: soft, non-tender; bowel sounds normal; no masses,  no organomegaly Extremities: extremities normal, atraumatic, no cyanosis or edema Pulses: 2+ and symmetric Skin: Skin color, texture, turgor normal. No rashes or lesions Neurologic: Mental status: Slow thought process  EKG: Atrial fibrillation with slow ventricular response of 55  ASSESSMENT: 1. Chronic atrial fibrillation - CHADSVASC score of 6 (not on anticoagulation now due to [place]) - not a watchman candidate 2. Recent stroke with intracerebral hemorrhage 3. History of congestive heart failure, EF of 45% - up to 55-60% by echo (07/2015) 4. Hypertension 5. History of thoracic aneurysm - stable at 4.6-4.8 cm (07/2015) 6. Diabetes  7. Dyslipidemia 8. Chronic kidney disease  PLAN: 1.   Ms. Gouin seems to be fairly stable at this point. As mentioned I discussed her possible candidacy for the watchman left atrial appendage occluder device with her neurologist in my partners and we feel that she's not a candidate for anticoagulation. We'll continue aspirin for the time being. She had a CT scan of her aorta in January 2017 which shows a slightly smaller size aneurysm of 4.6 cm and thoracic aorta versus 4.8 cm. This will likely need to be repeated in 6-12 months. She fortunately has had no further strokes. Blood pressures at goal today. She appears euvolemic on exam.  Follow-up with me in 6 months.  Pixie Casino, MD, [facility] Attending Cardiologist [place] 12/09/2015, 2:13 PM

## 2015-12-14 IMAGING — CT CT ABD-PELV W/O CM
2 of 4 series · 13 of 46 positions shown, 15 images · non-contrast
Comparison: CT abdomen and pelvis with contrast 02/09/2013.

CLINICAL DATA: Epigastric pain and nausea. History of bowel
resection for colon cancer.

EXAM:
CT ABDOMEN AND PELVIS WITHOUT CONTRAST
TECHNIQUE: Multidetector CT imaging of the abdomen and pelvis was performed
following the standard protocol without IV contrast.

[Series 201: routine, idose (2) · axial · 0.74mm/px · z∈[-411,-61]mm · 10 of 84 slices shown, 12 images]
[im 7/84  soft-tissue]
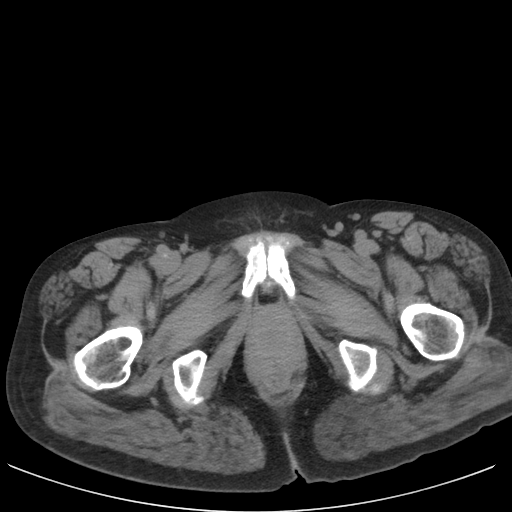
[im 7/84  bone]
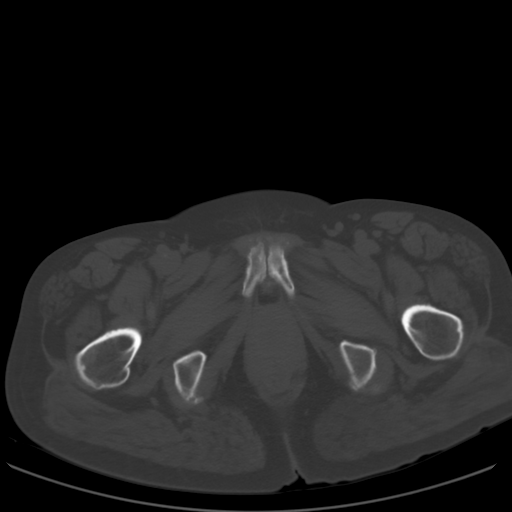
[im 14/84  soft-tissue]
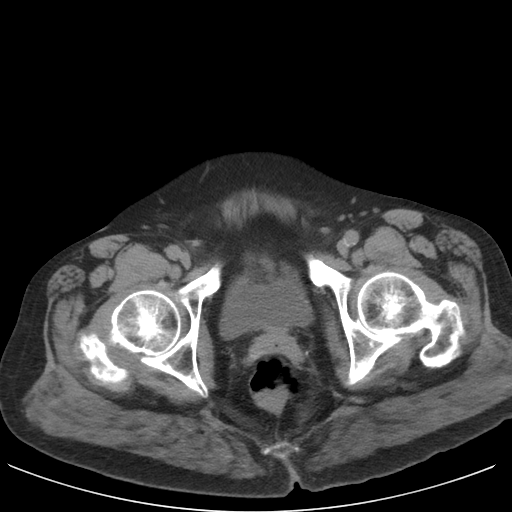
[im 24/84  soft-tissue]
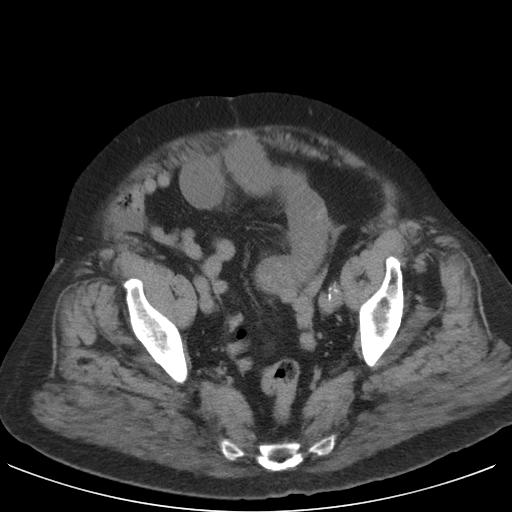
[im 30/84  soft-tissue]
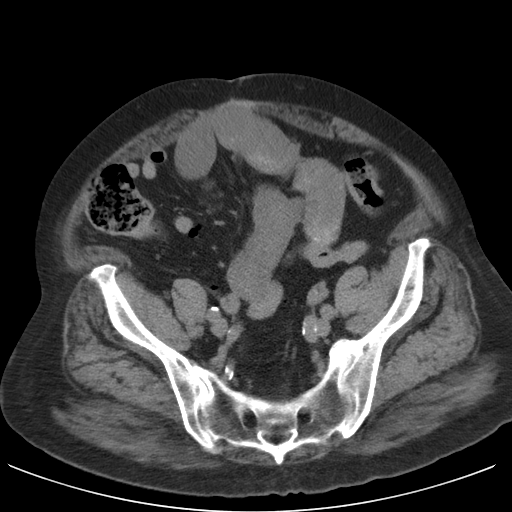
[im 37/84  soft-tissue]
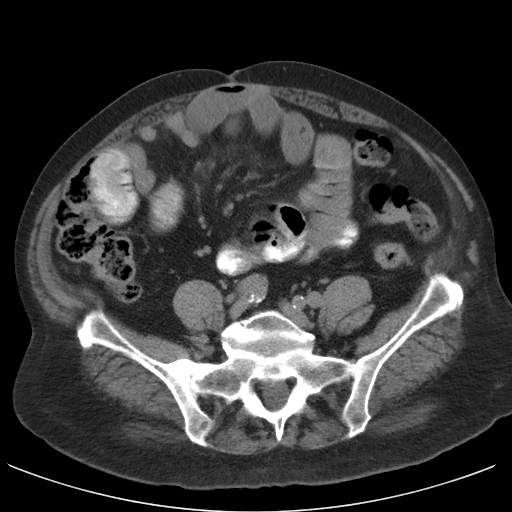
[im 47/84  soft-tissue]
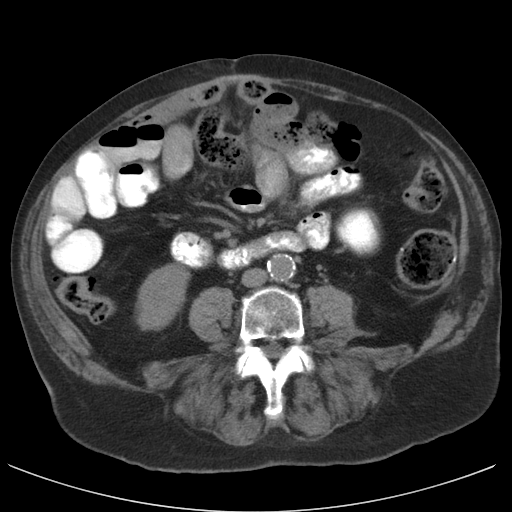
[im 54/84  soft-tissue]
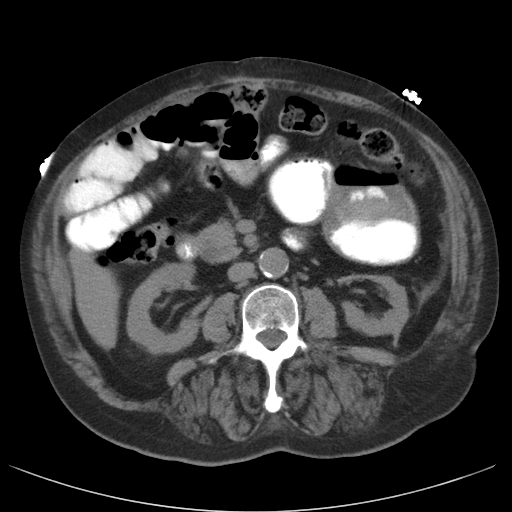
[im 64/84  soft-tissue]
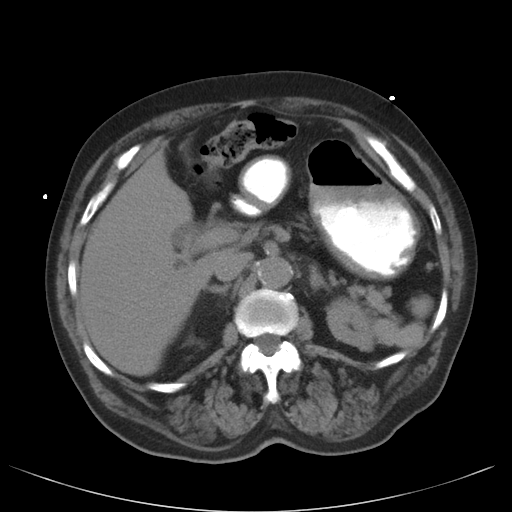
[im 70/84  soft-tissue]
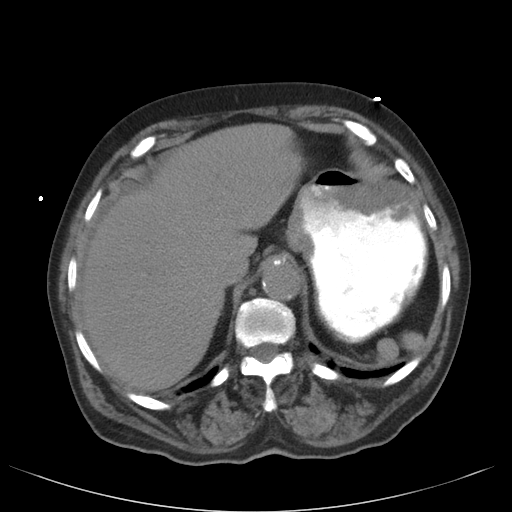
[im 70/84  bone]
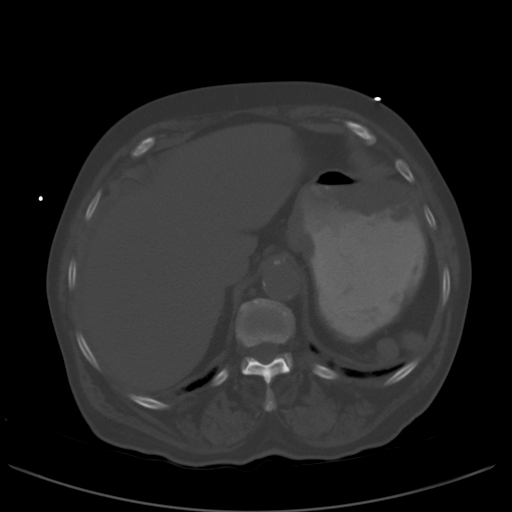
[im 77/84  soft-tissue]
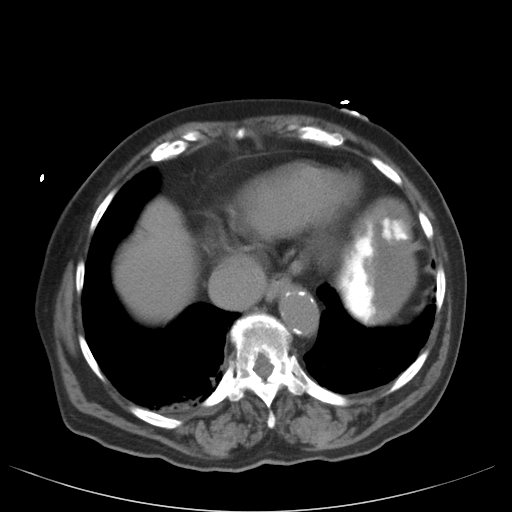

[Series 203: coronals, idose (2) · coronal · 0.50mm/px · 3 of 134 slices shown]
[im 45/134  soft-tissue]
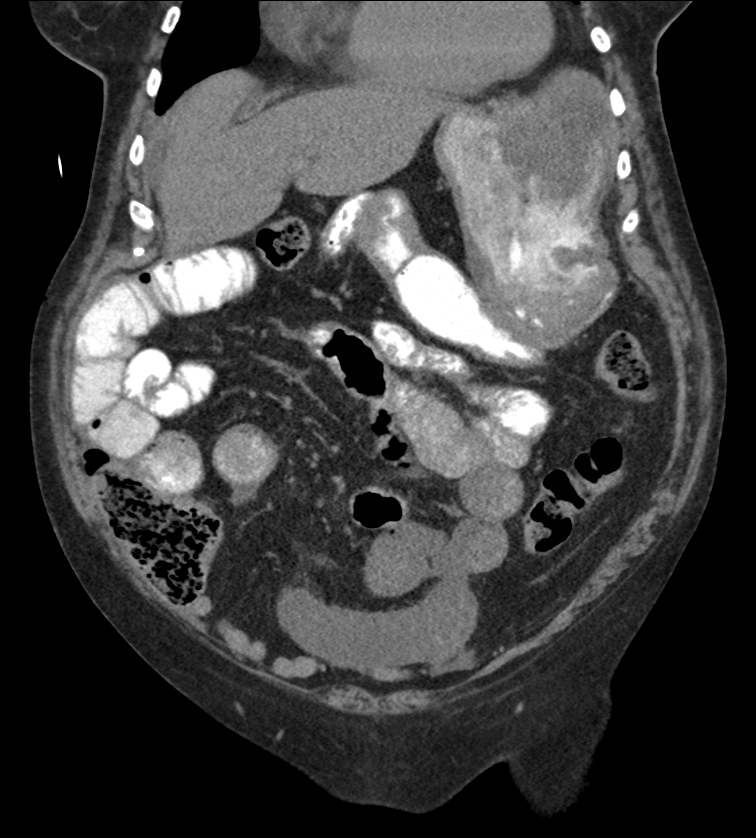
[im 60/134  soft-tissue]
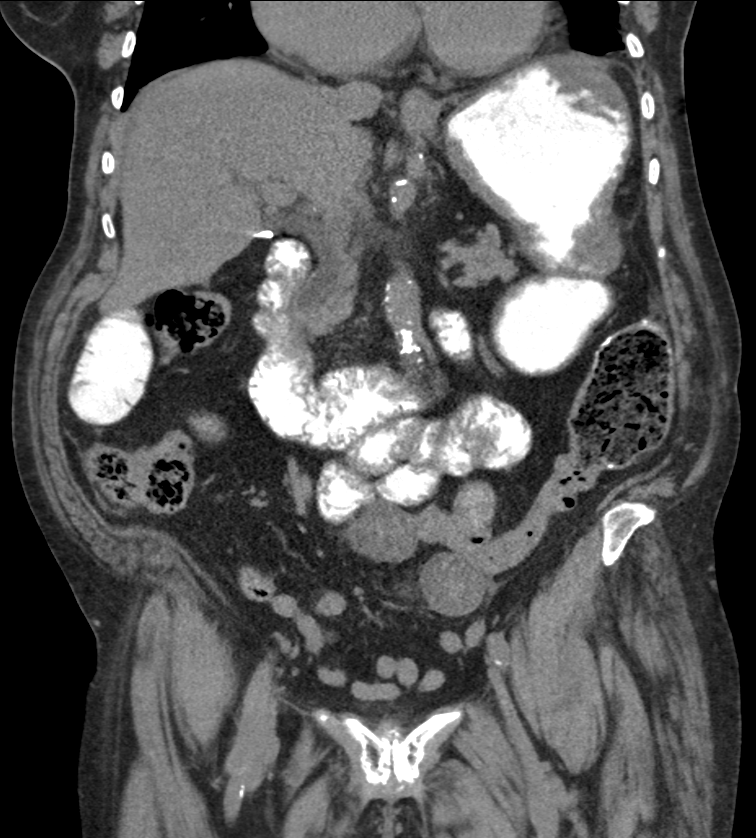
[im 74/134  soft-tissue]
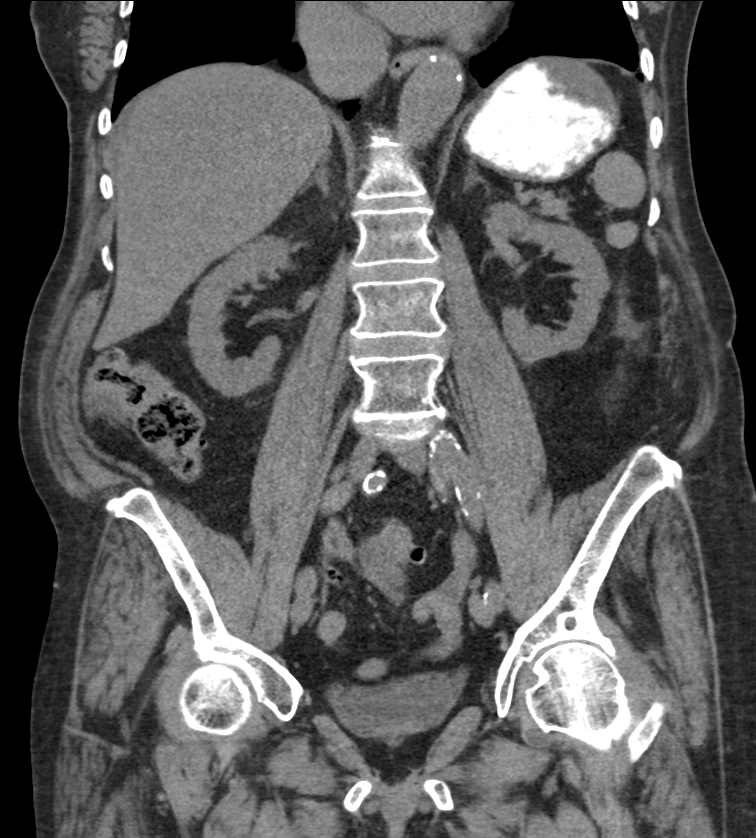

[13 of 46 positions shown; findings below may reference images not displayed]

FINDINGS: Mild dependent atelectasis is present at the lung bases bilaterally.
No focal nodule, mass, or airspace disease is present. The heart is
mildly enlarged without significant change. Atherosclerotic
calcifications are present in the aorta without aneurysm.

Minimal fluid is present around the liver. The liver is otherwise
unremarkable. Patient is status post splenectomy. Multiple soft
tissue nodules are stable, reflecting residual splenules. Contrast
is present within the stomach. The duodenum is unremarkable. The
pancreas is within normal limits. There is significant interval
enlargement of the common bile duct, now measuring 2.4 cm. No
obstructing mass is evident. The adrenal glands are normal
bilaterally. Renal atrophy is stable. The ureters are within normal
limits.

The rectosigmoid colon is within normal limits. The left lower
quadrant anastomosis is intact. The more proximal colon is within
normal limits as well. The appendix is visualized and normal.

Dilated loops of small bowel are present in the mid abdomen with
gradual dilution of contrast. The transition point is in the low
anterior pelvis appears to be associated with adhesions. No
obstructing mass lesion is present. There is no free fluid in the
pelvis. The patient is status post hysterectomy. The ovaries are
visualized and appear to be within normal limits.

The bone windows demonstrate partial fusion along the SI joints
bilaterally. Mild degenerative changes are noted in the lumbar
spine. No focal lytic or blastic lesions are evident.
IMPRESSION: 1. Partial small bowel obstruction with transition point in the mid
lower abdomen likely related to adhesions.
2. Stable appearance of the chronic CT anastomosis.
3. Atherosclerosis.
4. Significant interval increase and the size the common bile duct
without an obstructing lesion.
5. Mild cardiomegaly without failure.

## 2015-12-15 IMAGING — CR DG ABDOMEN 1V
2 series · 2 of 2 positions shown · non-contrast
Comparison: 01/23/2014

CLINICAL DATA: Follow-up small bowel obstruction.

EXAM:
ABDOMEN - 1 VIEW

[t abdomen supine (1 of 2)]
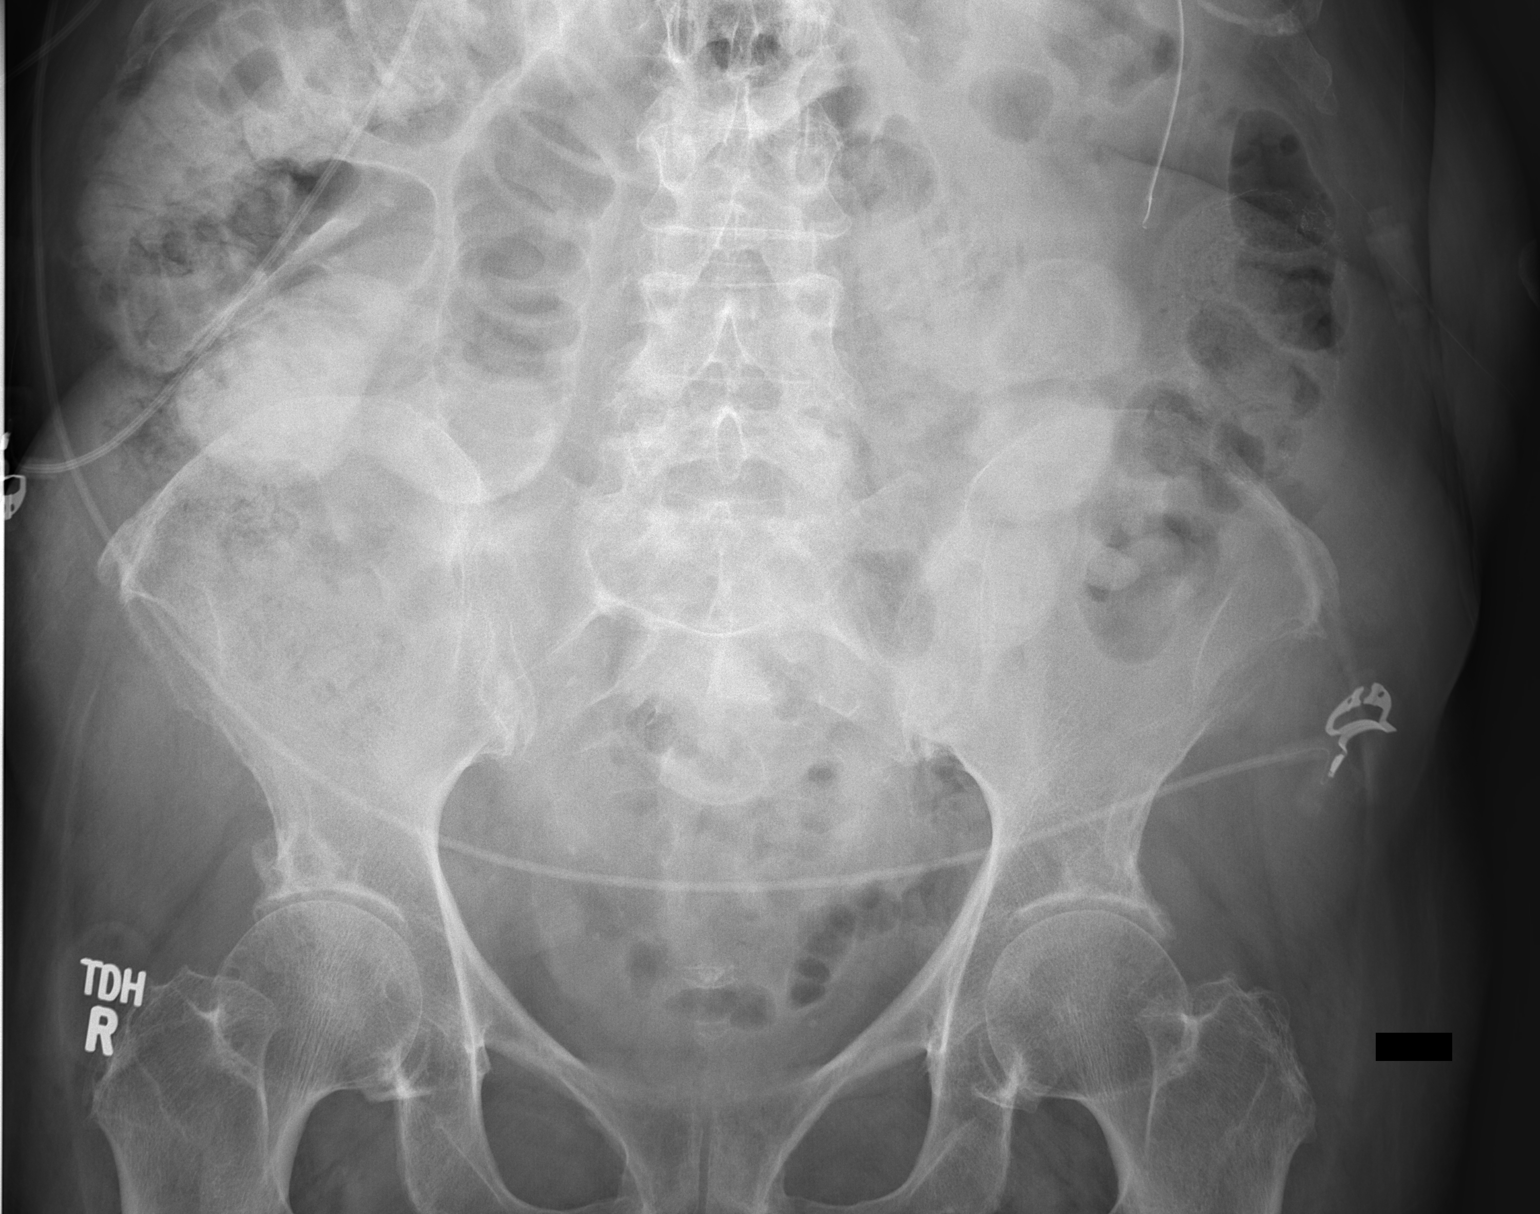

[t abdomen supine (2 of 2)]
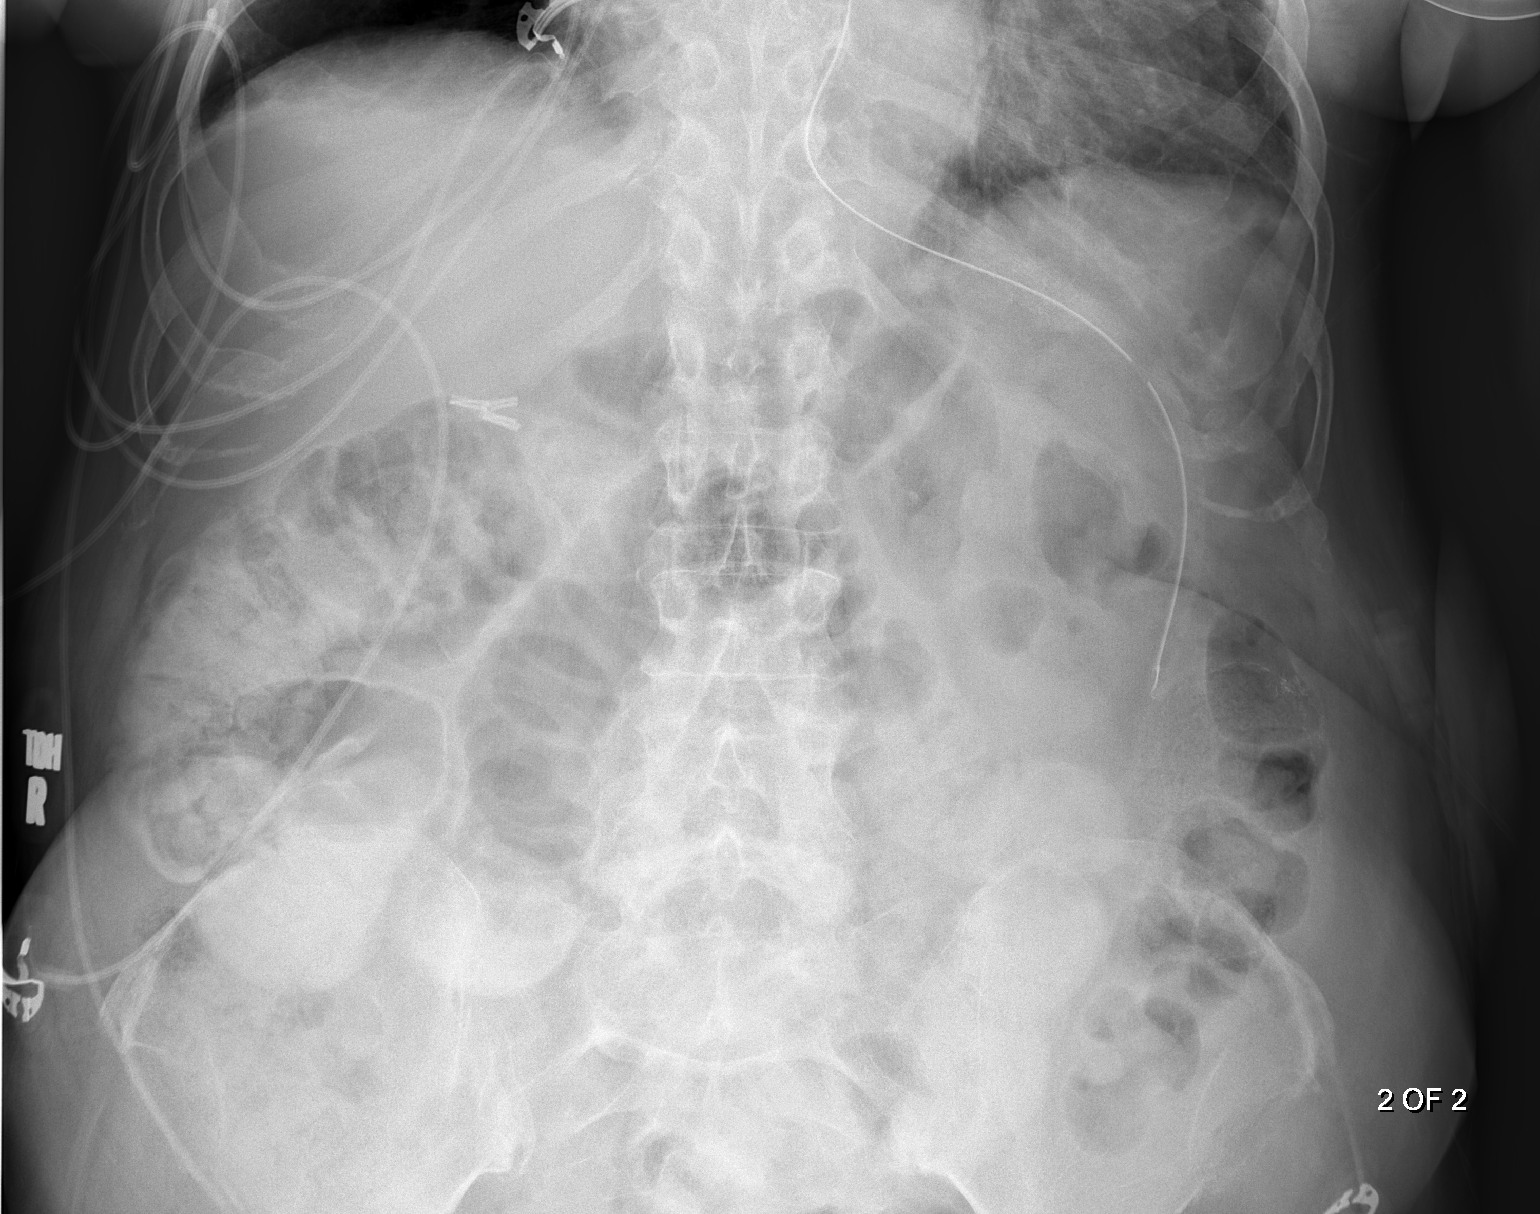

[2 of 2 positions shown; findings below may reference images not displayed]

FINDINGS: Supine images of the abdomen were obtained. Nasogastric has been
placed and the tip is in the gastric body region. There continues to
be dilated loops of small bowel which contain air and oral contrast.
There is stool in the colon but no significant contrast in the
colon. There may be slightly increased distention of small bowel
loops compared to the recent CT examination. Limited evaluation for
free air on the supine images.
IMPRESSION: Persistent dilated loops of small bowel and there may be slightly
increased distention of the small bowel loops. Findings remain
compatible with a small bowel obstruction.

Nasogastric tube in the stomach body region.

## 2015-12-16 IMAGING — CR DG ABD PORTABLE 2V
2 series · 2 of 2 positions shown · non-contrast
Comparison: 01/24/2014

CLINICAL DATA: Abdominal pain.

EXAM:
PORTABLE ABDOMEN - 2 VIEW

[AP]
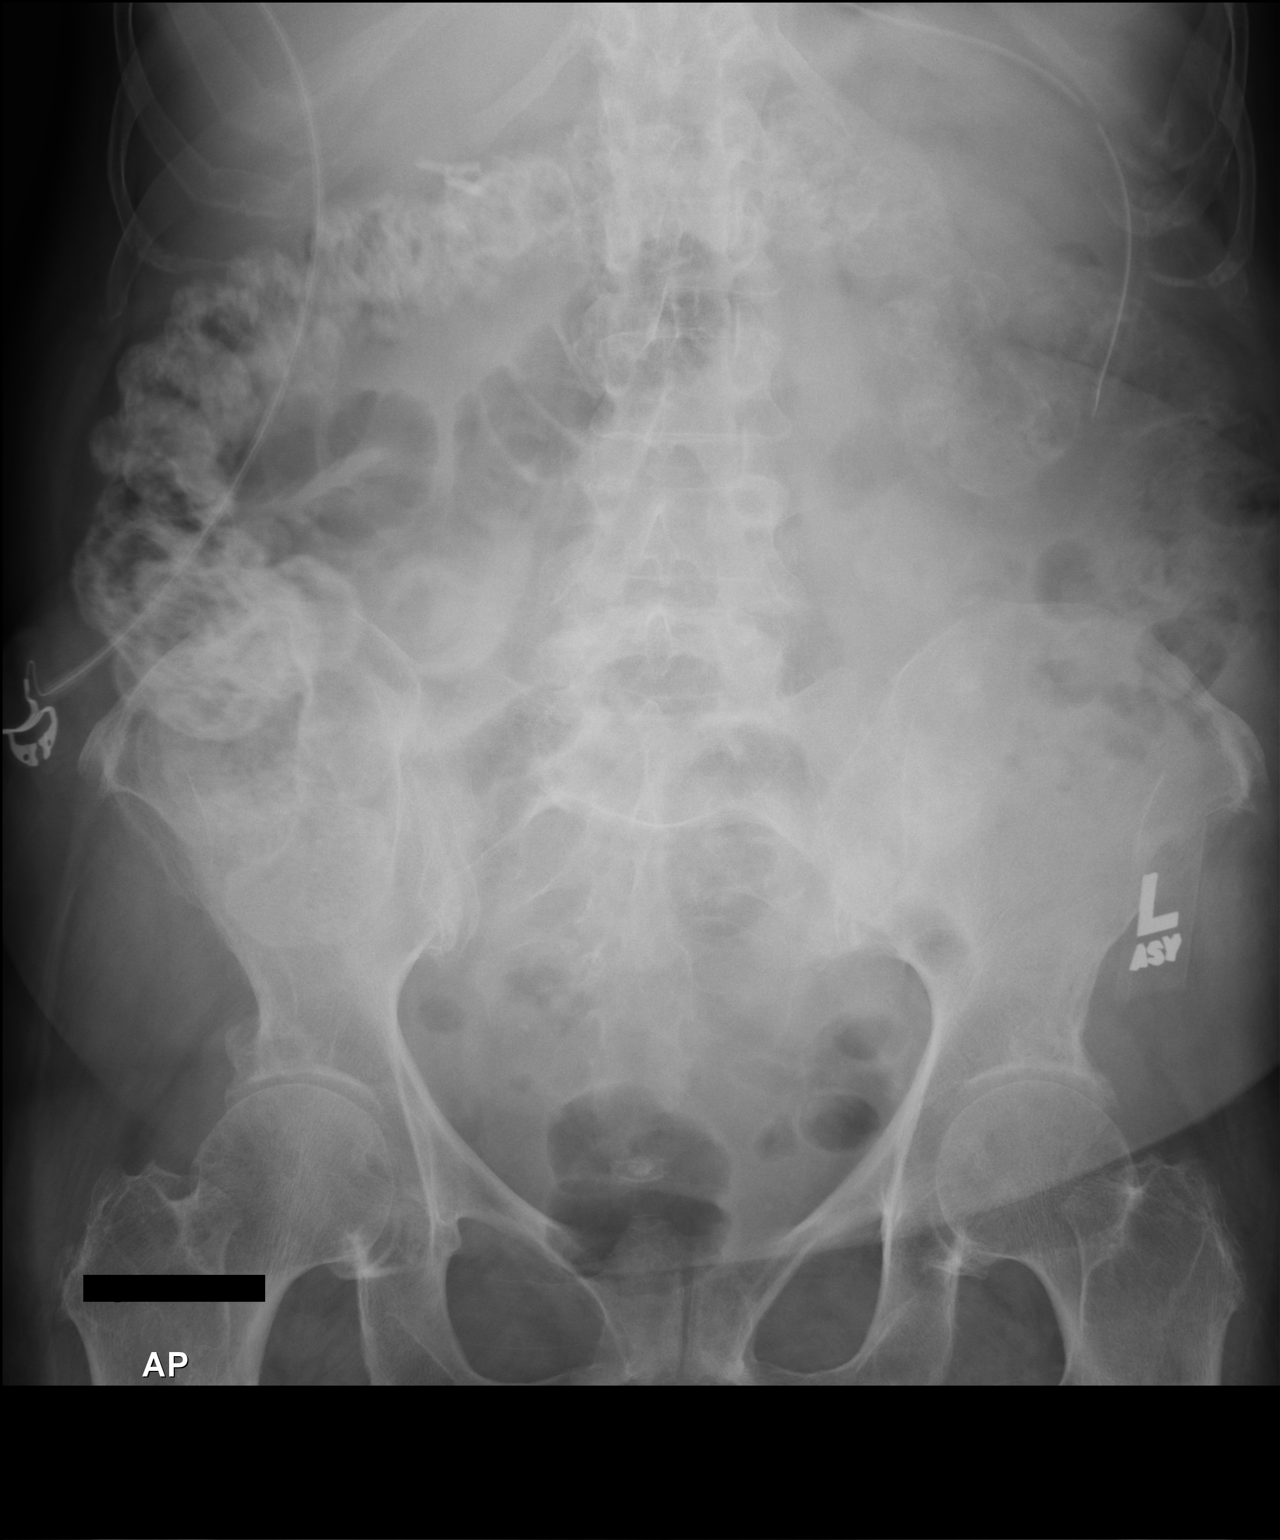

[ap lld]
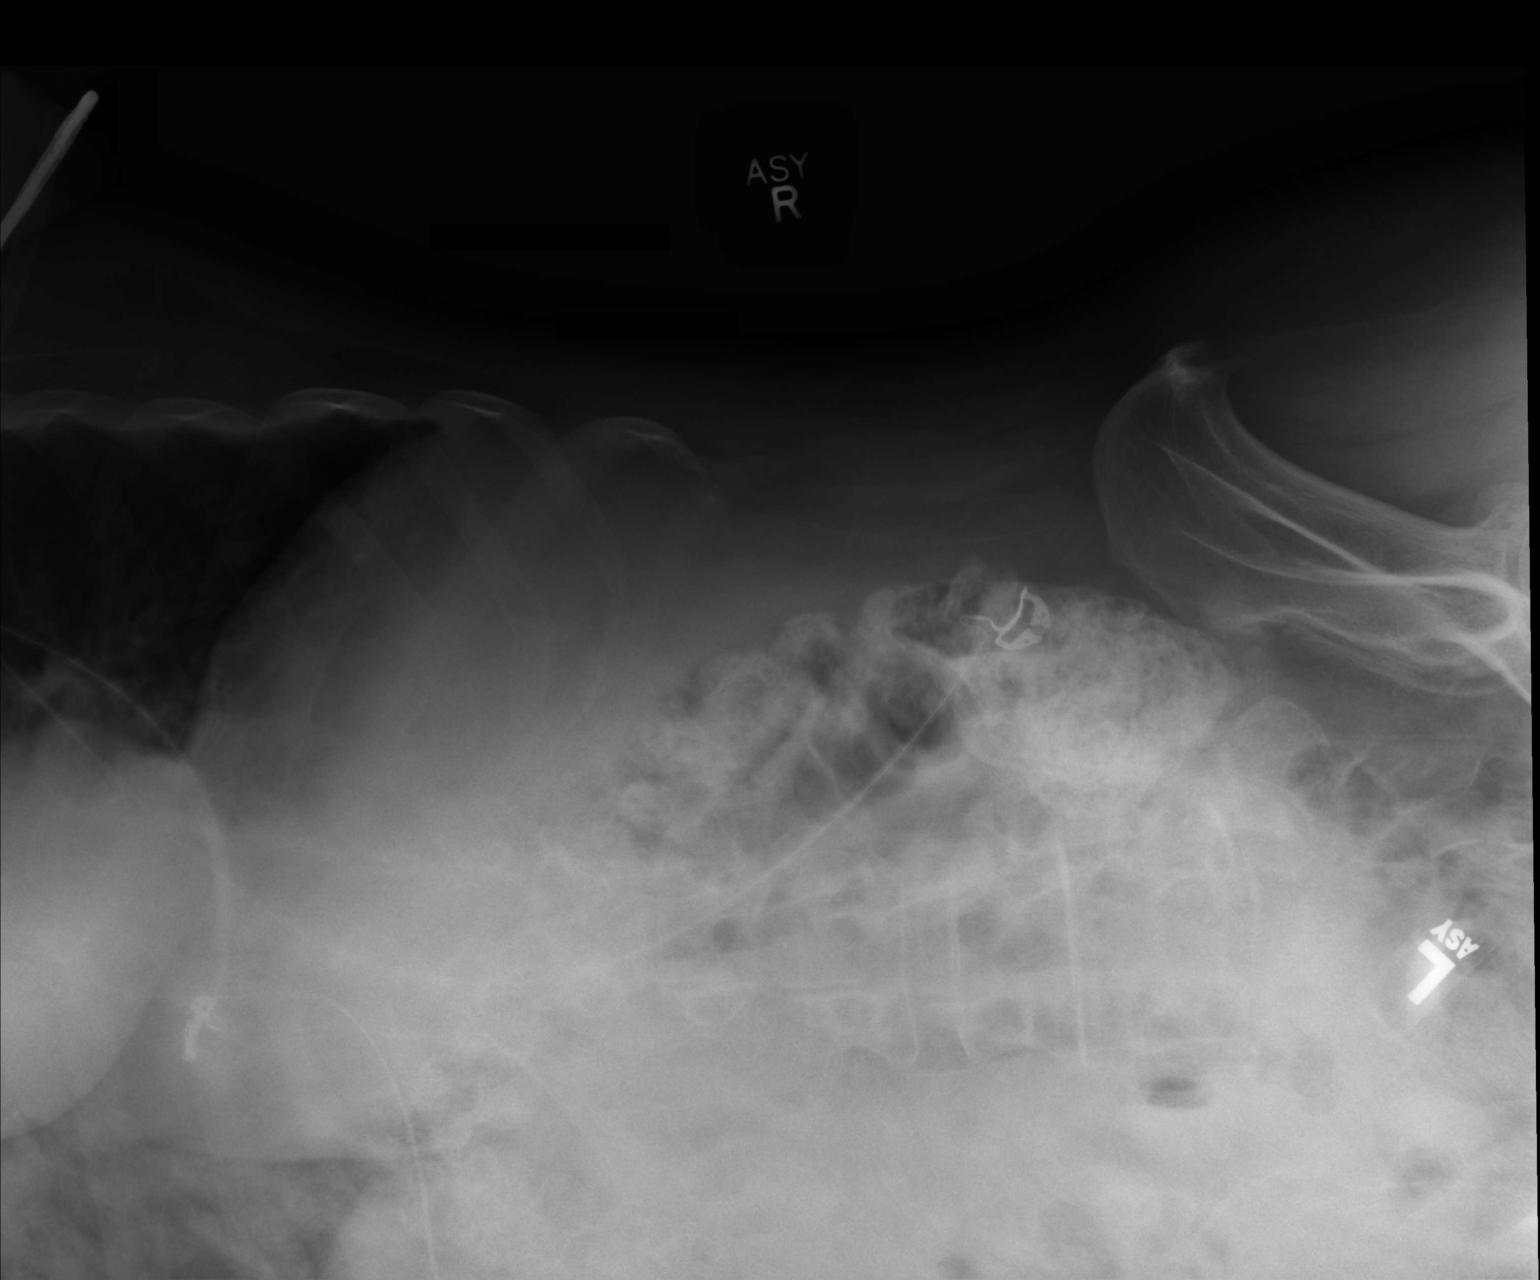

[2 of 2 positions shown; findings below may reference images not displayed]

FINDINGS: Decrease in prominence of small bowel loops without evidence of
progressive small bowel obstruction. No free air is identified.
Nasogastric tube continues to extend into the stomach.
IMPRESSION: Decrease in an dilatation of small bowel loops.  No free air.

## 2015-12-19 IMAGING — CR DG ABD PORTABLE 1V
1 series · 1 of 1 positions shown · non-contrast
Comparison: January 25, 2014

CLINICAL DATA: Recent bowel obstruction

EXAM:
PORTABLE ABDOMEN - 1 VIEW

[AP]
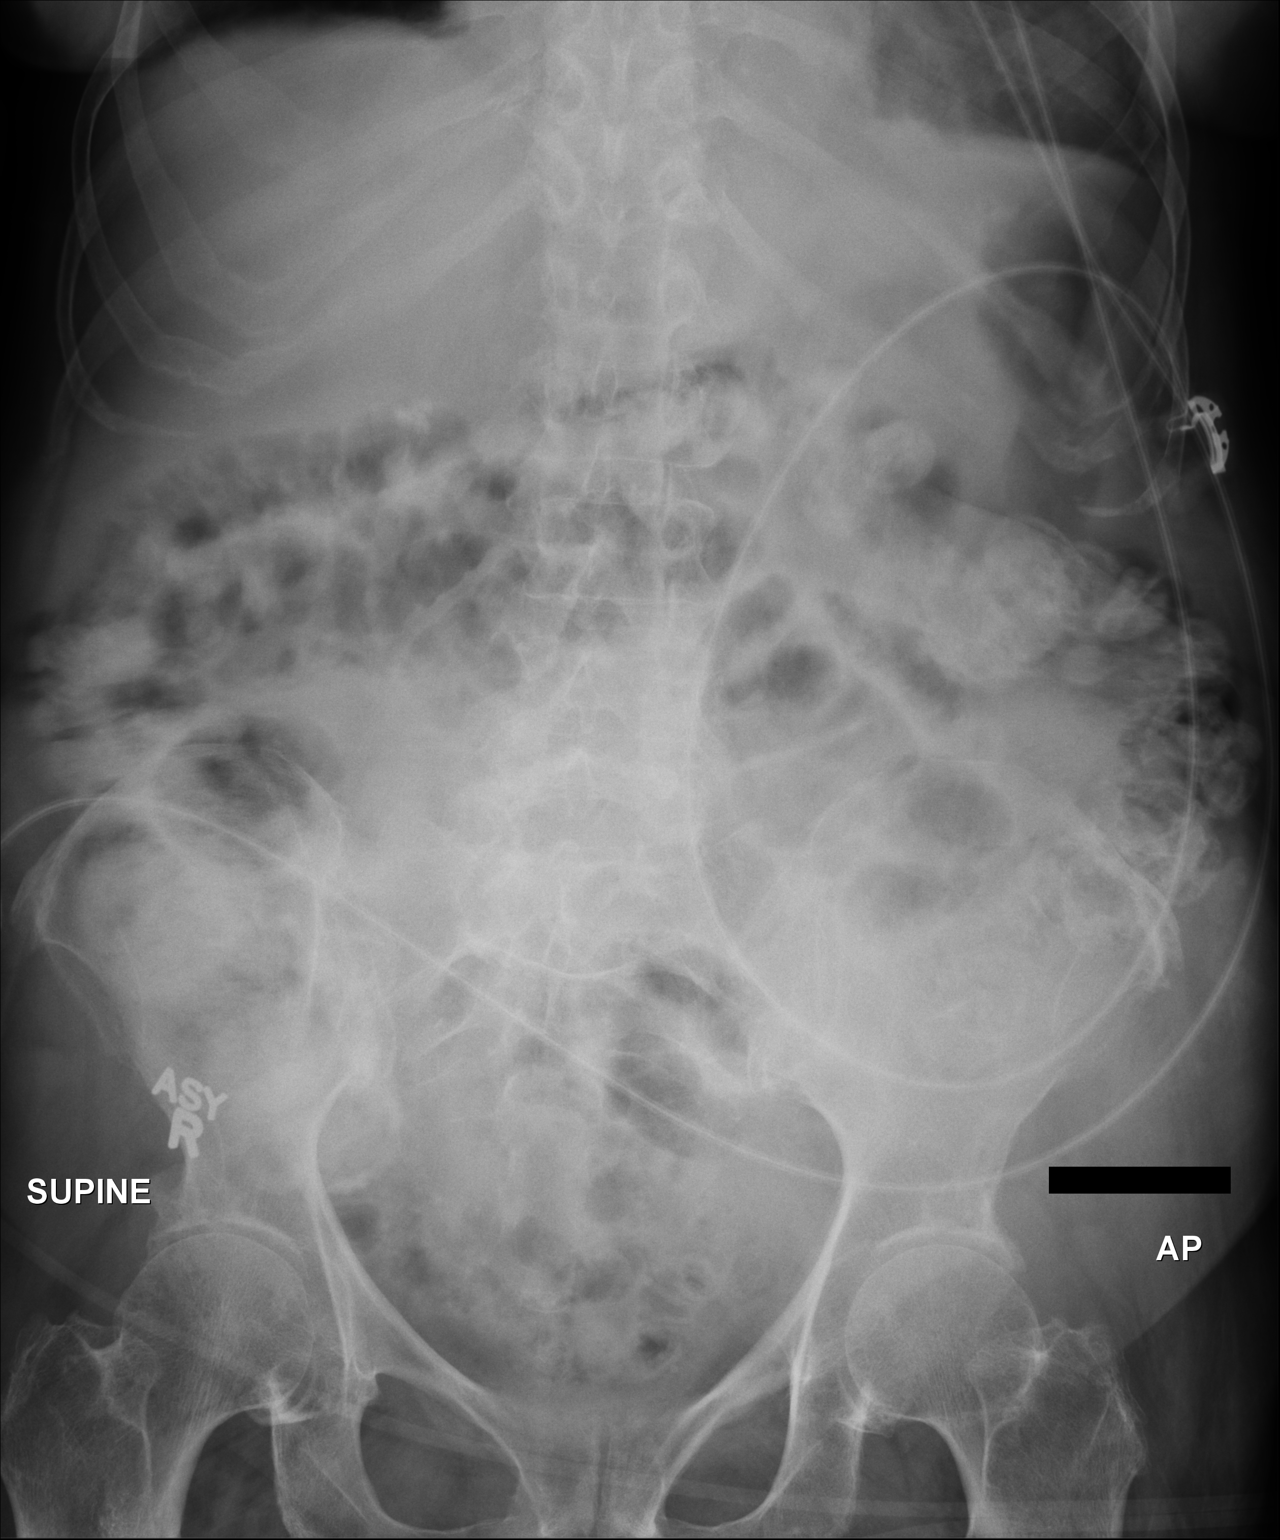

[1 of 1 positions shown; findings below may reference images not displayed]

FINDINGS: Nasogastric tube is been removed. There is contrast in the colon.
Currently there is no appreciable bowel dilatation. No air-fluid
levels are seen. No free air is seen on this supine examination.
There are a few small phleboliths in the pelvis.
IMPRESSION: Bowel gas pattern is felt to be unremarkable currently. There is
contrast in the colon. No obstruction or free air is seen on this
supine examination.

## 2015-12-20 ENCOUNTER — Other Ambulatory Visit: Payer: Self-pay | Admitting: *Deleted

## 2015-12-20 MED ORDER — LISINOPRIL 20 MG PO TABS: ORAL_TABLET | ORAL | Status: DC

## 2015-12-20 NOTE — Telephone Encounter (Signed)
[facility] 

## 2015-12-21 ENCOUNTER — Other Ambulatory Visit: Payer: Self-pay

## 2015-12-21 MED ORDER — CLONIDINE HCL 0.2 MG PO TABS: ORAL_TABLET | ORAL | Status: DC

## 2015-12-21 NOTE — Telephone Encounter (Signed)
Refill sent electronically to [facility]. Clonidine HCL 0.2 mg tablet. #90 with 2 RF

## 2016-01-02 ENCOUNTER — Other Ambulatory Visit: Payer: Self-pay | Admitting: *Deleted

## 2016-01-05 ENCOUNTER — Observation Stay (HOSPITAL_COMMUNITY)
Admission: EM | Admit: 2016-01-05 | Discharge: 2016-01-08 | Disposition: A | Discharge status: Home / Self Care | Payer: Medicare Other | Attending: Internal Medicine | Admitting: Internal Medicine

## 2016-01-05 ENCOUNTER — Other Ambulatory Visit: Payer: Self-pay | Admitting: *Deleted

## 2016-01-05 ENCOUNTER — Encounter (HOSPITAL_COMMUNITY): Payer: Self-pay

## 2016-01-05 ENCOUNTER — Telehealth: Payer: Self-pay | Admitting: *Deleted

## 2016-01-05 ENCOUNTER — Emergency Department (HOSPITAL_COMMUNITY): Payer: Medicare Other

## 2016-01-05 DIAGNOSIS — R1013 Epigastric pain: Secondary | ICD-10-CM | POA: Diagnosis not present

## 2016-01-05 DIAGNOSIS — Z8673 Personal history of transient ischemic attack (TIA), and cerebral infarction without residual deficits: Secondary | ICD-10-CM | POA: Diagnosis not present

## 2016-01-05 DIAGNOSIS — Z9181 History of falling: Secondary | ICD-10-CM | POA: Diagnosis not present

## 2016-01-05 DIAGNOSIS — I5033 Acute on chronic diastolic (congestive) heart failure: Secondary | ICD-10-CM | POA: Insufficient documentation

## 2016-01-05 DIAGNOSIS — Z7982 Long term (current) use of aspirin: Secondary | ICD-10-CM | POA: Insufficient documentation

## 2016-01-05 DIAGNOSIS — F0150 Vascular dementia without behavioral disturbance: Secondary | ICD-10-CM | POA: Diagnosis not present

## 2016-01-05 DIAGNOSIS — M109 Gout, unspecified: Secondary | ICD-10-CM | POA: Insufficient documentation

## 2016-01-05 DIAGNOSIS — Z85038 Personal history of other malignant neoplasm of large intestine: Secondary | ICD-10-CM

## 2016-01-05 DIAGNOSIS — K219 Gastro-esophageal reflux disease without esophagitis: Secondary | ICD-10-CM | POA: Diagnosis not present

## 2016-01-05 DIAGNOSIS — I110 Hypertensive heart disease with heart failure: Secondary | ICD-10-CM | POA: Diagnosis not present

## 2016-01-05 DIAGNOSIS — I130 Hypertensive heart and chronic kidney disease with heart failure and stage 1 through stage 4 chronic kidney disease, or unspecified chronic kidney disease: Principal | ICD-10-CM | POA: Insufficient documentation

## 2016-01-05 DIAGNOSIS — D649 Anemia, unspecified: Secondary | ICD-10-CM | POA: Diagnosis not present

## 2016-01-05 DIAGNOSIS — R51 Headache: Secondary | ICD-10-CM | POA: Insufficient documentation

## 2016-01-05 DIAGNOSIS — Z79899 Other long term (current) drug therapy: Secondary | ICD-10-CM | POA: Insufficient documentation

## 2016-01-05 DIAGNOSIS — E038 Other specified hypothyroidism: Secondary | ICD-10-CM | POA: Diagnosis not present

## 2016-01-05 DIAGNOSIS — I482 Chronic atrial fibrillation: Secondary | ICD-10-CM | POA: Diagnosis not present

## 2016-01-05 DIAGNOSIS — I4891 Unspecified atrial fibrillation: Secondary | ICD-10-CM | POA: Insufficient documentation

## 2016-01-05 DIAGNOSIS — I10 Essential (primary) hypertension: Secondary | ICD-10-CM | POA: Diagnosis not present

## 2016-01-05 DIAGNOSIS — R030 Elevated blood-pressure reading, without diagnosis of hypertension: Secondary | ICD-10-CM | POA: Diagnosis not present

## 2016-01-05 DIAGNOSIS — I5043 Acute on chronic combined systolic (congestive) and diastolic (congestive) heart failure: Secondary | ICD-10-CM

## 2016-01-05 DIAGNOSIS — E1122 Type 2 diabetes mellitus with diabetic chronic kidney disease: Secondary | ICD-10-CM | POA: Insufficient documentation

## 2016-01-05 DIAGNOSIS — R109 Unspecified abdominal pain: Secondary | ICD-10-CM

## 2016-01-05 DIAGNOSIS — N189 Chronic kidney disease, unspecified: Secondary | ICD-10-CM | POA: Insufficient documentation

## 2016-01-05 DIAGNOSIS — I714 Abdominal aortic aneurysm, without rupture: Secondary | ICD-10-CM | POA: Insufficient documentation

## 2016-01-05 DIAGNOSIS — E7800 Pure hypercholesterolemia, unspecified: Secondary | ICD-10-CM | POA: Insufficient documentation

## 2016-01-05 DIAGNOSIS — I160 Hypertensive urgency: Secondary | ICD-10-CM | POA: Insufficient documentation

## 2016-01-05 DIAGNOSIS — E034 Atrophy of thyroid (acquired): Secondary | ICD-10-CM | POA: Diagnosis not present

## 2016-01-05 DIAGNOSIS — R0602 Shortness of breath: Secondary | ICD-10-CM | POA: Diagnosis not present

## 2016-01-05 DIAGNOSIS — R000 Tachycardia, unspecified: Secondary | ICD-10-CM | POA: Diagnosis not present

## 2016-01-05 DIAGNOSIS — I4821 Permanent atrial fibrillation: Secondary | ICD-10-CM | POA: Diagnosis present

## 2016-01-05 DIAGNOSIS — I5030 Unspecified diastolic (congestive) heart failure: Secondary | ICD-10-CM

## 2016-01-05 DIAGNOSIS — Z66 Do not resuscitate: Secondary | ICD-10-CM | POA: Insufficient documentation

## 2016-01-05 DIAGNOSIS — E039 Hypothyroidism, unspecified: Secondary | ICD-10-CM | POA: Diagnosis present

## 2016-01-05 LAB — COMPREHENSIVE METABOLIC PANEL
ALK PHOS: 134 U/L — AB (ref 38–126)
ALT: 38 U/L (ref 14–54)
AST: 73 U/L — ABNORMAL HIGH (ref 15–41)
Albumin: 3.2 g/dL — ABNORMAL LOW (ref 3.5–5.0)
Anion gap: 7 (ref 5–15)
BUN: 14 mg/dL (ref 6–20)
CALCIUM: 8.5 mg/dL — AB (ref 8.9–10.3)
CO2: 26 mmol/L (ref 22–32)
CREATININE: 1.22 mg/dL — AB (ref 0.44–1.00)
Chloride: 106 mmol/L (ref 101–111)
GFR, EST AFRICAN AMERICAN: 46 mL/min — AB (ref >60)
GFR, EST NON AFRICAN AMERICAN: 40 mL/min — AB (ref >60)
Glucose, Bld: 160 mg/dL — ABNORMAL HIGH (ref 65–99)
Potassium: 3.3 mmol/L — ABNORMAL LOW (ref 3.5–5.1)
Sodium: 139 mmol/L (ref 135–145)
TOTAL PROTEIN: 6.8 g/dL (ref 6.5–8.1)
Total Bilirubin: 0.7 mg/dL (ref 0.3–1.2)

## 2016-01-05 LAB — TROPONIN I

## 2016-01-05 LAB — CBC
HCT: 30.9 % — ABNORMAL LOW (ref 36.0–46.0)
HEMOGLOBIN: 10.2 g/dL — AB (ref 12.0–15.0)
MCH: 20.2 pg — AB (ref 26.0–34.0)
MCHC: 33 g/dL (ref 30.0–36.0)
MCV: 61.3 fL — AB (ref 78.0–100.0)
PLATELETS: 273 10*3/uL (ref 150–400)
RBC: 5.04 MIL/uL (ref 3.87–5.11)
RDW: 21.7 % — ABNORMAL HIGH (ref 11.5–15.5)
WBC: 7.1 10*3/uL (ref 4.0–10.5)

## 2016-01-05 LAB — BRAIN NATRIURETIC PEPTIDE: B NATRIURETIC PEPTIDE 5: 1249.2 pg/mL — AB (ref 0.0–100.0)

## 2016-01-05 MED ORDER — ONDANSETRON HCL 4 MG/2ML IJ SOLN: 4.0000 mg | Freq: Four times a day (QID) | INTRAMUSCULAR | Status: DC | PRN

## 2016-01-05 MED ORDER — ISOSORBIDE MONONITRATE ER 30 MG PO TB24
30.0000 mg | ORAL_TABLET | Freq: Every day | ORAL | Status: DC
  Administered 2016-01-05 – 2016-01-08 (×4): 30 mg via ORAL
  Filled 2016-01-05 (×4): qty 1

## 2016-01-05 MED ORDER — LEVOTHYROXINE SODIUM 50 MCG PO TABS
50.0000 ug | ORAL_TABLET | Freq: Every day | ORAL | Status: DC
  Administered 2016-01-06 – 2016-01-08 (×3): 50 ug via ORAL
  Filled 2016-01-05 (×3): qty 1

## 2016-01-05 MED ORDER — METOPROLOL TARTRATE 50 MG PO TABS
75.0000 mg | ORAL_TABLET | Freq: Two times a day (BID) | ORAL | Status: DC
  Administered 2016-01-05 – 2016-01-07 (×5): 75 mg via ORAL
  Filled 2016-01-05 (×5): qty 1

## 2016-01-05 MED ORDER — ALLOPURINOL 100 MG PO TABS
100.0000 mg | ORAL_TABLET | Freq: Every day | ORAL | Status: DC
Start: 2016-01-06 — End: 2016-01-08
  Administered 2016-01-06 – 2016-01-08 (×3): 100 mg via ORAL
  Filled 2016-01-05 (×3): qty 1

## 2016-01-05 MED ORDER — SENNOSIDES-DOCUSATE SODIUM 8.6-50 MG PO TABS: 1.0000 | ORAL_TABLET | Freq: Two times a day (BID) | ORAL | Status: DC | PRN

## 2016-01-05 MED ORDER — LISINOPRIL 20 MG PO TABS: ORAL_TABLET | ORAL | Status: DC

## 2016-01-05 MED ORDER — SODIUM CHLORIDE 0.9 % IV SOLN: INTRAVENOUS | Status: DC

## 2016-01-05 MED ORDER — POTASSIUM CHLORIDE CRYS ER 20 MEQ PO TBCR
40.0000 meq | EXTENDED_RELEASE_TABLET | Freq: Once | ORAL | Status: AC
Start: 2016-01-05 — End: 2016-01-05
  Administered 2016-01-05: 40 meq via ORAL
  Filled 2016-01-05: qty 2

## 2016-01-05 MED ORDER — FUROSEMIDE 10 MG/ML IJ SOLN
20.0000 mg | Freq: Once | INTRAMUSCULAR | Status: AC
  Administered 2016-01-05: 20 mg via INTRAVENOUS
  Filled 2016-01-05: qty 2

## 2016-01-05 MED ORDER — SODIUM CHLORIDE 0.9% FLUSH: 3.0000 mL | INTRAVENOUS | Status: DC | PRN

## 2016-01-05 MED ORDER — FUROSEMIDE 10 MG/ML IJ SOLN
40.0000 mg | Freq: Once | INTRAMUSCULAR | Status: AC
  Administered 2016-01-05: 40 mg via INTRAVENOUS
  Filled 2016-01-05: qty 4

## 2016-01-05 MED ORDER — POTASSIUM CHLORIDE CRYS ER 20 MEQ PO TBCR
40.0000 meq | EXTENDED_RELEASE_TABLET | Freq: Once | ORAL | Status: AC
  Administered 2016-01-05: 40 meq via ORAL
  Filled 2016-01-05: qty 2

## 2016-01-05 MED ORDER — ALLOPURINOL 100 MG PO TABS: 100.0000 mg | ORAL_TABLET | Freq: Every day | ORAL | Status: DC

## 2016-01-05 MED ORDER — CLONIDINE HCL 0.2 MG PO TABS
0.2000 mg | ORAL_TABLET | Freq: Three times a day (TID) | ORAL | Status: DC
Start: 2016-01-05 — End: 2016-01-06
  Administered 2016-01-05 (×2): 0.2 mg via ORAL
  Filled 2016-01-05 (×2): qty 1

## 2016-01-05 MED ORDER — ASPIRIN EC 81 MG PO TBEC
81.0000 mg | DELAYED_RELEASE_TABLET | Freq: Every day | ORAL | Status: DC
  Administered 2016-01-06 – 2016-01-08 (×3): 81 mg via ORAL
  Filled 2016-01-05 (×3): qty 1

## 2016-01-05 MED ORDER — SODIUM CHLORIDE 0.9 % IV SOLN: 250.0000 mL | INTRAVENOUS | Status: DC | PRN

## 2016-01-05 MED ORDER — CLONIDINE HCL 0.1 MG PO TABS
0.2000 mg | ORAL_TABLET | Freq: Once | ORAL | Status: AC
  Administered 2016-01-05: 0.2 mg via ORAL
  Filled 2016-01-05: qty 2

## 2016-01-05 MED ORDER — SODIUM CHLORIDE 0.9% FLUSH
3.0000 mL | Freq: Two times a day (BID) | INTRAVENOUS | Status: DC
  Administered 2016-01-05 – 2016-01-08 (×4): 3 mL via INTRAVENOUS

## 2016-01-05 MED ORDER — LEVOTHYROXINE SODIUM 50 MCG PO TABS: ORAL_TABLET | ORAL | Status: DC

## 2016-01-05 MED ORDER — SPIRONOLACTONE 25 MG PO TABS
25.0000 mg | ORAL_TABLET | Freq: Every day | ORAL | Status: DC
  Administered 2016-01-05 – 2016-01-08 (×4): 25 mg via ORAL
  Filled 2016-01-05 (×4): qty 1

## 2016-01-05 MED ORDER — LISINOPRIL 20 MG PO TABS: 20.0000 mg | ORAL_TABLET | Freq: Every day | ORAL | Status: DC

## 2016-01-05 MED ORDER — HYDRALAZINE HCL 50 MG PO TABS
50.0000 mg | ORAL_TABLET | Freq: Three times a day (TID) | ORAL | Status: DC
  Administered 2016-01-05 – 2016-01-08 (×9): 50 mg via ORAL
  Filled 2016-01-05 (×9): qty 1

## 2016-01-05 MED ORDER — DONEPEZIL HCL 10 MG PO TABS
10.0000 mg | ORAL_TABLET | Freq: Every day | ORAL | Status: DC
  Administered 2016-01-05 – 2016-01-07 (×3): 10 mg via ORAL
  Filled 2016-01-05 (×5): qty 1

## 2016-01-05 MED ORDER — NITROGLYCERIN 2 % TD OINT
1.0000 [in_us] | TOPICAL_OINTMENT | Freq: Once | TRANSDERMAL | Status: AC
  Administered 2016-01-05: 1 [in_us] via TOPICAL
  Filled 2016-01-05: qty 1

## 2016-01-05 MED ORDER — ACETAMINOPHEN 325 MG PO TABS
650.0000 mg | ORAL_TABLET | ORAL | Status: DC | PRN
  Administered 2016-01-06 – 2016-01-07 (×2): 650 mg via ORAL
  Filled 2016-01-05 (×2): qty 2

## 2016-01-05 MED ORDER — GLUCERNA SHAKE PO LIQD
237.0000 mL | Freq: Two times a day (BID) | ORAL | Status: DC | PRN
  Filled 2016-01-05: qty 237

## 2016-01-05 NOTE — Progress Notes (Signed)
Patient arrived to 4th floor from ED and BP was 172/104. MD made aware of patient's BP.  MD putting in admitting orders.  Will continue to monitor and admin. Medications once ordered.

## 2016-01-05 NOTE — ED Notes (Signed)
Per EMS- Patient c/o increased SOB today. EKG-atrial fib. Patient has a history of a fib and HTN. Patient has not taken meds for blood pressure today.

## 2016-01-05 NOTE — Telephone Encounter (Signed)
[facility] 

## 2016-01-05 NOTE — Telephone Encounter (Signed)
Patient sister, Mel Almond called and stated that her sisters BP is 166/106 and patient is not as responsive as she should be and is having SOB. Cora wanted to know what she should do. I instructed her to call 911 and have EMS come out and evaluate her. She agreed and stated that she is going to call 911.

## 2016-01-05 NOTE — ED Provider Notes (Signed)
CSN: [id number]     Arrival date & time 01/05/16  1333 History   First MD Initiated Contact with Patient 01/05/16 1346     Chief Complaint  Patient presents with  . Shortness of Breath     (Consider location/radiation/quality/duration/timing/severity/associated sxs/prior Treatment) Patient is a 82 y.o. female presenting with shortness of breath. The history is provided by the patient, a relative and the EMS personnel.  Shortness of Breath Associated symptoms: no abdominal pain, no chest pain, no fever, no headaches, no neck pain, no rash, no sore throat and no vomiting   Patient w hx chf, chronic afib unable to tolerate anticoag therapy, presents with increased sob in past few days. Gradual onset, progressive, worse today. No specific exacerbating or allev factors. Compliant w normal meds, denies recent change. Urinating normal amt. No change in diet, normal appetite. No chest pain or discomfort. Notes chronic bil leg edema, no recent change. Denies cough or uri c/o. No fever or chills.      Past Medical History  Diagnosis Date  . Colon cancer ([place]) 07/1997  . Hypertension   . Diabetes mellitus   . CHF (congestive heart failure) ([place])   . AAA (abdominal aortic aneurysm) ([place])   . Atrial fibrillation ([place])   . Hypercholesteremia   . Kidney stone     renal calculi  . Small bowel obstruction due to adhesions ([place]) 05/16/2012  . Hypothyroidism   . Arthritis   . Anemia   . Cholelithiasis   . Perforation of bile duct   . Hemorrhage of rectum and anus   . Swelling, mass, or lump in head and neck   . Cervicalgia   . Hypoglycemia, unspecified   . Pain in joint, lower leg   . Anxiety   . Shortness of breath   . Acute upper respiratory infections of unspecified site   . Depression   . Thoracic aneurysm without mention of rupture   . GERD (gastroesophageal reflux disease)   . Proteinuria   . Other specified cardiac dysrhythmias(427.89)   . Congestive heart failure, unspecified    . Electrolyte and fluid disorders not elsewhere classified   . Other chronic allergic conjunctivitis   . Hypopotassemia   . Chronic systolic heart failure ([place])   . Dizziness and giddiness   . Malignant neoplasm of colon, unspecified site   . Other and unspecified hyperlipidemia   . Gout, unspecified   . Atrial fibrillation ([place])   . Aortic aneurysm of unspecified site without mention of rupture   . Pain in joint, site unspecified   . Palpitations   . CKD (chronic kidney disease)   . Diabetes mellitus ([place])   . Stroke [facility])    Past Surgical History  Procedure Laterality Date  . Hemicolectomy  08/11/1997  . Kidney stone surgery    . Colon surgery      to remove colon ca  . Cholecystectomy  05/14/2012    Procedure: LAPAROSCOPIC CHOLECYSTECTOMY WITH INTRAOPERATIVE CHOLANGIOGRAM;  Surgeon: Haywood Lasso, MD;  Location: [place];  Service: General;  Laterality: N/A;  . Ercp  05/17/2012    Procedure: ENDOSCOPIC RETROGRADE CHOLANGIOPANCREATOGRAPHY (ERCP);  Surgeon: Missy Sabins, MD;  Location: [place];  Service: Gastroenterology;  Laterality: N/A;  . Ercp  08/12/2012    Procedure: ENDOSCOPIC RETROGRADE CHOLANGIOPANCREATOGRAPHY (ERCP);  Surgeon: Missy Sabins, MD;  Location: [facility] ENDOSCOPY;  Service: Endoscopy;  Laterality: N/A;  . Transthoracic echocardiogram  11/2009    EF 45-50%, mild-mod AV regurg; mild MR;  mod TR; ascending aorta mildly dilated  . Cardiac catheterization  2008    moderate severe pulm HTN; with elevted pulm capillary wedge pressure    Family History  Problem Relation Age of Onset  . Heart disease Mother   . Stroke Father   . Hypertension Daughter   . Hypertension Son   . Diabetes Sister   . Hypertension Son    Social History  Substance Use Topics  . Smoking status: Never Smoker   . Smokeless tobacco: Never Used  . Alcohol Use: No   OB History    No data available     Review of Systems  Constitutional: Negative for fever and chills.  HENT: Negative for  sore throat.   Eyes: Negative for redness.  Respiratory: Positive for shortness of breath.   Cardiovascular: Negative for chest pain.  Gastrointestinal: Negative for vomiting, abdominal pain and diarrhea.  Genitourinary: Negative for dysuria and flank pain.  Musculoskeletal: Negative for back pain and neck pain.  Skin: Negative for rash.  Neurological: Negative for headaches.  Hematological: Does not bruise/bleed easily.  Psychiatric/Behavioral: Negative for confusion.      Allergies  Iohexol; Z-pak; and Iodinated diagnostic agents  Home Medications   Prior to Admission medications   Medication Sig Start Date End Date Taking? Authorizing Provider  allopurinol (ZYLOPRIM) 100 MG tablet Take 1 tablet (100 mg total) by mouth daily. 01/05/16  Yes [name] [name], DO  aspirin EC 81 MG tablet Take 81 mg by mouth daily.   Yes Historical Provider, MD  cloNIDine (CATAPRES) 0.2 MG tablet Take one tablet by mouth three times daily for hypertension 12/21/15  Yes [name] [name], DO  diltiazem (CARDIZEM) 60 MG tablet Take 60 mg by mouth 4 (four) times daily.   Yes Historical Provider, MD  donepezil (ARICEPT) 10 MG tablet Take 1 tablet (10 mg total) by mouth at bedtime. 10/14/15  Yes [name] [name], DO  levothyroxine (SYNTHROID, LEVOTHROID) 50 MCG tablet Take one tablet by mouth 30 minutes before breakfast away from other medications for thyroid 01/05/16  Yes [name] [name], DO  lisinopril (PRINIVIL,ZESTRIL) 20 MG tablet Take one tablet by mouth twice daily for hypertension 01/05/16  Yes [name] [name], DO  Melatonin-Pyridoxine (MELATIN PO) Take 5 tablets by mouth at bedtime as needed. For sleep   Yes Historical Provider, MD  pantoprazole sodium (PROTONIX) 40 mg/20 mL PACK Take 40 mg by mouth daily.    Yes Historical Provider, MD  potassium chloride (K-DUR) 10 MEQ tablet Take 2 tablets (20 mEq total) by mouth daily as needed (take with lasix only.). Patient taking differently: Take 10 mEq by mouth 2 (two)  times daily.  11/15/15  Yes [name] [name], DO  Blood Glucose Calibration (ACCU-CHEK AVIVA) SOLN See admin instructions. 05/30/15   Historical Provider, MD  blood glucose meter kit and supplies Dispense based on patient and insurance preference. Use up to four times daily as directed. (FOR ICD-9 250.00, 250.01). 07/30/15   Lavina Hamman, MD  feeding supplement, ENSURE ENLIVE, (ENSURE ENLIVE) LIQD Take 237 mLs by mouth 2 (two) times daily between meals. 07/30/15   Lavina Hamman, MD  furosemide (LASIX) 20 MG tablet Take 1 tablet (20 mg total) by mouth daily as needed (take for more than 2 Lb weight gain in a day or pedal edema along with potassium). 07/30/15   Lavina Hamman, MD  glucose blood (ACCU-CHEK AVIVA) test strip Use to check blood sugar daily. Dx: E11.21 10/18/15  [name] Lynelle Doctor, DO  Lancets (ACCU-CHEK SOFT TOUCH) lancets Use as instructed 10/18/15   [name] [name], DO  metoprolol (LOPRESSOR) 50 MG tablet Take 50 mg by mouth 2 (two) times daily. 12/08/15   Historical Provider, MD  metoprolol 75 MG TABS Take 75 mg by mouth 2 (two) times daily. 10/14/15   [name] [name], DO  senna-docusate (SENOKOT-S) 8.6-50 MG tablet Take 1 tablet by mouth 2 (two) times daily. 07/30/15   Lavina Hamman, MD   BP 190/122 mmHg  Pulse 79  Temp(Src) 97.9 F (36.6 C) (Oral)  Resp 27  SpO2 98% Physical Exam  Constitutional: She appears well-developed and well-nourished. No distress.  HENT:  Mouth/Throat: Oropharynx is clear and moist.  Eyes: Conjunctivae are normal. No scleral icterus.  Neck: Neck supple. No tracheal deviation present.  Cardiovascular: Normal rate, normal heart sounds and intact distal pulses.  Exam reveals no gallop and no friction rub.   No murmur heard. Irregular rhythm   Pulmonary/Chest: Effort normal. No respiratory distress. She has rales.  Rales bases.   Abdominal: Soft. Normal appearance and bowel sounds are normal. She exhibits no distension. There is no tenderness.  Musculoskeletal: She  exhibits no edema.  Non tender, symmetric lower leg and ankle edema.   Neurological: She is alert.  Skin: Skin is warm and dry. No rash noted. She is not diaphoretic.  Psychiatric: She has a normal mood and affect.  Nursing note and vitals reviewed.   ED Course  Procedures (including critical care time) Labs Review  Results for orders placed or performed during the hospital encounter of 01/05/16  CBC  Result Value Ref Range   WBC 7.1 4.0 - 10.5 K/uL   RBC 5.04 3.87 - 5.11 MIL/uL   Hemoglobin 10.2 (L) 12.0 - 15.0 g/dL   HCT 30.9 (L) 36.0 - 46.0 %   MCV 61.3 (L) 78.0 - 100.0 fL   MCH 20.2 (L) 26.0 - 34.0 pg   MCHC 33.0 30.0 - 36.0 g/dL   RDW 21.7 (H) 11.5 - 15.5 %   Platelets 273 150 - 400 K/uL  Comprehensive metabolic panel  Result Value Ref Range   Sodium 139 135 - 145 mmol/L   Potassium 3.3 (L) 3.5 - 5.1 mmol/L   Chloride 106 101 - 111 mmol/L   CO2 26 22 - 32 mmol/L   Glucose, Bld 160 (H) 65 - 99 mg/dL   BUN 14 6 - 20 mg/dL   Creatinine, Ser 1.22 (H) 0.44 - 1.00 mg/dL   Calcium 8.5 (L) 8.9 - 10.3 mg/dL   Total Protein 6.8 6.5 - 8.1 g/dL   Albumin 3.2 (L) 3.5 - 5.0 g/dL   AST 73 (H) 15 - 41 U/L   ALT 38 14 - 54 U/L   Alkaline Phosphatase 134 (H) 38 - 126 U/L   Total Bilirubin 0.7 0.3 - 1.2 mg/dL   GFR calc non Af Amer 40 (L) >60 mL/min   GFR calc Af Amer 46 (L) >60 mL/min   Anion gap 7 5 - 15  Brain natriuretic peptide  Result Value Ref Range   B Natriuretic Peptide 1249.2 (H) 0.0 - 100.0 pg/mL  Troponin I  Result Value Ref Range   Troponin I <0.03 <0.031 ng/mL   Dg Chest Port 1 View  01/05/2016  CLINICAL DATA:  Shortness of Breath EXAM: PORTABLE CHEST 1 VIEW COMPARISON:  Chest radiograph July 27, 2015 and chest CT July 28, 2015 FINDINGS: There is generalized interstitial edema. There are  small pleural effusions bilaterally. There is generalized cardiomegaly with pulmonary venous hypertension. There is airspace consolidation in the medial right base. There is  atherosclerotic calcification in the aorta. No adenopathy evident. There is degenerative change in the thoracic spine and in each shoulder. IMPRESSION: Congestive heart failure. There is airspace consolidation in the medial right base which may represent either chronic atelectasis, pleural fluid tracking into this area, or possible superimposed pneumonia. Opacification in this area was present on prior study. Electronically Signed   By: Lowella Grip III M.D.   On: 01/05/2016 14:15      I have personally reviewed and evaluated these images and lab results as part of my medical decision-making.   EKG Interpretation   Date/Time:  Thursday January 05 2016 13:44:05 EDT Ventricular Rate:  95 PR Interval:    QRS Duration: 112 QT Interval:  435 QTC Calculation: 547 R Axis:   57 Text Interpretation:  Atrial fibrillation Borderline intraventricular  conduction delay Prolonged QT interval Non-specific ST-t changes Confirmed  by Ashok Cordia  MD, Lennette Bihari ([id number]) on 01/05/2016 1:55:27 PM      MDM  Iv ns. Continuous pulse ox and monitor.   o2 nasal cannula.  Reviewed nursing notes and prior charts for additional history.   Labs. Cxr. Ecg.  bp high.  Pt hasnt had her due dose of clonidine yet today - will give clonidine.  ntg ointment also applied.   cxr noted. Lasix iv.  Recheck bp improved.   Dyspnea mildly improved, remains sob.  Hospitalists consulted for admission.       Lajean Saver, MD 01/05/16 [phone]

## 2016-01-05 NOTE — Progress Notes (Signed)
Patient BP was rechecked ~30 minutes after PO BP meds given. BP was 177/108. Night coverage paged. No new orders received at this time.  Will pass along to night RN to reassess BP and page if night coverage if no improvement.

## 2016-01-05 NOTE — Telephone Encounter (Signed)
Noted, thanks!

## 2016-01-05 NOTE — H&P (Signed)
[facility] History and Physical   Patient: Nicole [name]:[id number]   PCP: Hollace Kinnier, DO DOB: 01-13-1931   DOA: 01/05/2016   DOS: 01/05/2016   DOS: the patient was seen and examined on 01/05/2016  Patient coming from: The patient is coming from home.  Chief Complaint: shortness of breath.  HPI: Nicole [name] is a 82 y.o. female with Past medical history of Essential hypertension, diabetes mellitus, chronic diastolic dysfunction, AAA 4.6 cm in size, A. fib not on any anticoagulation due to recurrent fall and intracranial hemorrhage, hypothyroidism, vascular dementia. The patient presented with complains of shortness of breath. Patient also had elevated high blood pressure. As per patient she has been having fainting episodes on Sunday which is resolved on its own. She denies having any headache dizziness or lightheadedness or vertigo, no chest pain no shortness of breath no abdominal pain no nausea no vomiting. No diarrhea no constipation. No burning urination. Next and she compresses about swelling in his legs which is ongoing for last few weeks. She denies any tenderness in the leg. No focal deficit. She has a caregiver who provides her medication therefore she has not missed or skipped any of her medication. She has not been able to receive her Cardizem for last 2 weeks.  ED Course: Patient was given IV Lasix, carotid been with her blood pressure improved significantly.  At her baseline ambulates with walker And is independent for most of her ADL; manages her medication on her own.  Review of Systems: as mentioned in the history of present illness.  A comprehensive review of the other systems is negative.  Past Medical History  Diagnosis Date  . Colon cancer ([place]) 07/1997  . Hypertension   . Diabetes mellitus   . CHF (congestive heart failure) ([place])   . AAA (abdominal aortic aneurysm) ([place])   . Atrial fibrillation ([place])   . Hypercholesteremia   . Kidney stone      renal calculi  . Small bowel obstruction due to adhesions ([place]) 05/16/2012  . Hypothyroidism   . Arthritis   . Anemia   . Cholelithiasis   . Perforation of bile duct   . Hemorrhage of rectum and anus   . Swelling, mass, or lump in head and neck   . Cervicalgia   . Hypoglycemia, unspecified   . Pain in joint, lower leg   . Anxiety   . Shortness of breath   . Acute upper respiratory infections of unspecified site   . Depression   . Thoracic aneurysm without mention of rupture   . GERD (gastroesophageal reflux disease)   . Proteinuria   . Other specified cardiac dysrhythmias(427.89)   . Congestive heart failure, unspecified   . Electrolyte and fluid disorders not elsewhere classified   . Other chronic allergic conjunctivitis   . Hypopotassemia   . Chronic systolic heart failure ([place])   . Dizziness and giddiness   . Malignant neoplasm of colon, unspecified site   . Other and unspecified hyperlipidemia   . Gout, unspecified   . Atrial fibrillation ([place])   . Aortic aneurysm of unspecified site without mention of rupture   . Pain in joint, site unspecified   . Palpitations   . CKD (chronic kidney disease)   . Diabetes mellitus ([place])   . Stroke [facility])    Past Surgical History  Procedure Laterality Date  . Hemicolectomy  08/11/1997  . Kidney stone surgery    . Colon surgery      to  remove colon ca  . Cholecystectomy  05/14/2012    Procedure: LAPAROSCOPIC CHOLECYSTECTOMY WITH INTRAOPERATIVE CHOLANGIOGRAM;  Surgeon: Haywood Lasso, MD;  Location: [place];  Service: General;  Laterality: N/A;  . Ercp  05/17/2012    Procedure: ENDOSCOPIC RETROGRADE CHOLANGIOPANCREATOGRAPHY (ERCP);  Surgeon: Missy Sabins, MD;  Location: [place];  Service: Gastroenterology;  Laterality: N/A;  . Ercp  08/12/2012    Procedure: ENDOSCOPIC RETROGRADE CHOLANGIOPANCREATOGRAPHY (ERCP);  Surgeon: Missy Sabins, MD;  Location[facility] ENDOSCOPY;  Service: Endoscopy;  Laterality: N/A;  . Transthoracic  echocardiogram  11/2009    EF 45-50%, mild-mod AV regurg; mild MR; mod TR; ascending aorta mildly dilated  . Cardiac catheterization  2008    moderate severe pulm HTN; with elevted pulm capillary wedge pressure    Social History:  reports that she has never smoked. She has never used smokeless tobacco. She reports that she does not drink alcohol or use illicit drugs.  Allergies  Allergen Reactions  . Iohexol      Code: VOM, Desc: PT REPORTS VOMITING W/ IVP DYE- ARS 12/26/08, Onset Date: [id number]   . Z-Pak [Azithromycin]   . Iodinated Diagnostic Agents Nausea Only    PT HAS HAD SINGULAR INCIDENT OF NAUSEA  W/ IV CONTYRAST INJECTION, NONE IF INJECTED SLOWLY//A.[place]    Family History  Problem Relation Age of Onset  . Heart disease Mother   . Stroke Father   . Hypertension Daughter   . Hypertension Son   . Diabetes Sister   . Hypertension Son      Prior to Admission medications   Medication Sig Start Date End Date Taking? Authorizing Provider  allopurinol (ZYLOPRIM) 100 MG tablet Take 1 tablet (100 mg total) by mouth daily. 01/05/16  Yes [name] [name], DO  aspirin EC 81 MG tablet Take 81 mg by mouth daily.   Yes Historical Provider, MD  blood glucose meter kit and supplies Dispense based on patient and insurance preference. Use up to four times daily as directed. (FOR ICD-9 250.00, 250.01). 07/30/15  Yes Lavina Hamman, MD  cloNIDine (CATAPRES) 0.2 MG tablet Take one tablet by mouth three times daily for hypertension 12/21/15  Yes [name] [name], DO  diltiazem (CARDIZEM) 60 MG tablet Take 60 mg by mouth 4 (four) times daily.   Yes Historical Provider, MD  donepezil (ARICEPT) 10 MG tablet Take 1 tablet (10 mg total) by mouth at bedtime. 10/14/15  Yes [name] [name], DO  feeding supplement, GLUCERNA SHAKE, (GLUCERNA SHAKE) LIQD Take 237 mLs by mouth 2 (two) times daily as needed (for poor appetite).   Yes Historical Provider, MD  furosemide (LASIX) 20 MG tablet Take 1 tablet (20 mg total)  by mouth daily as needed (take for more than 2 Lb weight gain in a day or pedal edema along with potassium). 07/30/15  Yes Lavina Hamman, MD  glucose blood (ACCU-CHEK AVIVA) test strip Use to check blood sugar daily. Dx: E11.21 10/18/15  Yes [name] [name], DO  Lancets (ACCU-CHEK SOFT TOUCH) lancets Use as instructed 10/18/15  Yes [name] [name], DO  levothyroxine (SYNTHROID, LEVOTHROID) 50 MCG tablet Take one tablet by mouth 30 minutes before breakfast away from other medications for thyroid 01/05/16  Yes [name] [name], DO  lisinopril (PRINIVIL,ZESTRIL) 20 MG tablet Take one tablet by mouth twice daily for hypertension 01/05/16  Yes [name] [name], DO  Melatonin-Pyridoxine (MELATIN PO) Take 5 tablets by mouth at bedtime as needed. For sleep   Yes  Historical Provider, MD  metoprolol 75 MG TABS Take 75 mg by mouth 2 (two) times daily. 10/14/15  Yes [name] [name], DO  pantoprazole sodium (PROTONIX) 40 mg/20 mL PACK Take 40 mg by mouth daily as needed (acid reflux/ heart burn).    Yes Historical Provider, MD  potassium chloride (K-DUR) 10 MEQ tablet Take 2 tablets (20 mEq total) by mouth daily as needed (take with lasix only.). Patient taking differently: Take 10 mEq by mouth daily as needed (take only when taking lasix for edema).  11/15/15  Yes [name] [name], DO  senna-docusate (SENOKOT-S) 8.6-50 MG tablet Take 1 tablet by mouth 2 (two) times daily. Patient taking differently: Take 1 tablet by mouth 2 (two) times daily as needed for moderate constipation.  07/30/15  Yes Lavina Hamman, MD  feeding supplement, ENSURE ENLIVE, (ENSURE ENLIVE) LIQD Take 237 mLs by mouth 2 (two) times daily between meals. Patient not taking: Reported on 01/05/2016 07/30/15   Lavina Hamman, MD    Physical Exam: Filed Vitals:   01/05/16 1500 01/05/16 1530 01/05/16 1643 01/05/16 1724  BP: 159/92 152/98 185/111 172/104  Pulse: 64 57 72 68  Temp:    97.8 F (36.6 C)  TempSrc:    Oral  Resp: 20 21 25 22   SpO2: 95% 96% 96% 95%      General: Alert, Awake and Oriented to Time, Place and Person. Appear in mild distress Eyes: PERRL, Conjunctiva normal ENT: Oral Mucosa clear moist. Neck: no JVD, no Abnormal Mass Or lumps Cardiovascular: S1 and S2 Present, no Murmur, Peripheral Pulses Present Respiratory: Bilateral Air entry equal and Decreased, basal Crackles, no wheezes Abdomen: Bowel Sound present, Soft and no tenderness Skin: no redness, no Rash  Extremities: bilateral Pedal edema, no calf tenderness Neurologic: Grossly no focal neuro deficit. Bilaterally Equal motor strength Mental status AAOx3, speech normal, attention normal,  Cranial Nerves PERRL, EOM normal and present, Motor strength bilateral equal strength 5/5,  Sensation present to light touch,  Cerebellar test normal finger nose finger.  Labs on Admission:  CBC:  Recent Labs Lab 01/05/16 1426  WBC 7.1  HGB 10.2*  HCT 30.9*  MCV 61.3*  PLT 315   Basic Metabolic Panel:  Recent Labs Lab 01/05/16 1426  NA 139  K 3.3*  CL 106  CO2 26  GLUCOSE 160*  BUN 14  CREATININE 1.22*  CALCIUM 8.5*   GFR: CrCl cannot be calculated (Unknown ideal weight.). Liver Function Tests:  Recent Labs Lab 01/05/16 1426  AST 73*  ALT 38  ALKPHOS 134*  BILITOT 0.7  PROT 6.8  ALBUMIN 3.2*   No results for input(s): LIPASE, AMYLASE in the last 168 hours. No results for input(s): AMMONIA in the last 168 hours. Coagulation Profile: No results for input(s): INR, PROTIME in the last 168 hours. Cardiac Enzymes:  Recent Labs Lab 01/05/16 1426  TROPONINI <0.03   BNP (last 3 results) No results for input(s): PROBNP in the last 8760 hours. HbA1C: No results for input(s): HGBA1C in the last 72 hours. CBG: No results for input(s): GLUCAP in the last 168 hours. Lipid Profile: No results for input(s): CHOL, HDL, LDLCALC, TRIG, CHOLHDL, LDLDIRECT in the last 72 hours. Thyroid Function Tests: No results for input(s): TSH, T4TOTAL, FREET4, T3FREE,  THYROIDAB in the last 72 hours. Anemia Panel: No results for input(s): VITAMINB12, FOLATE, FERRITIN, TIBC, IRON, RETICCTPCT in the last 72 hours. Urine analysis:    Component Value Date/Time   COLORURINE YELLOW 07/28/2015 1050  APPEARANCEUR CLEAR 07/28/2015 1050   LABSPEC 1.010 07/28/2015 1050   PHURINE 7.0 07/28/2015 1050   GLUCOSEU NEGATIVE 07/28/2015 1050   HGBUR NEGATIVE 07/28/2015 [place] 07/28/2015 1050   KETONESUR NEGATIVE 07/28/2015 1050   PROTEINUR NEGATIVE 07/28/2015 1050   UROBILINOGEN 2.0* 04/13/2015 1844   NITRITE NEGATIVE 07/28/2015 1050   LEUKOCYTESUR TRACE* 07/28/2015 1050    Radiological Exams on Admission: Dg Chest Port 1 View  01/05/2016  CLINICAL DATA:  Shortness of Breath EXAM: PORTABLE CHEST 1 VIEW COMPARISON:  Chest radiograph July 27, 2015 and chest CT July 28, 2015 FINDINGS: There is generalized interstitial edema. There are small pleural effusions bilaterally. There is generalized cardiomegaly with pulmonary venous hypertension. There is airspace consolidation in the medial right base. There is atherosclerotic calcification in the aorta. No adenopathy evident. There is degenerative change in the thoracic spine and in each shoulder. IMPRESSION: Congestive heart failure. There is airspace consolidation in the medial right base which may represent either chronic atelectasis, pleural fluid tracking into this area, or possible superimposed pneumonia. Opacification in this area was present on prior study. Electronically Signed   By: Lowella Grip III M.D.   On: 01/05/2016 14:15   EKG: Independently reviewed. atrial fibrillation, rate control.  Assessment/Plan 1. Accelerated hypertension with diastolic congestive heart failure, NYHA class 3 (HCC) Blood pressure significantly elevated. Appears to be mildly volume overloaded as well. We will give her 20 mg of Lasix times one on top of 40 mg Lasix. Probably will start her on 40 mg Lasix IV  tomorrow. Continue rest of the medication, Cardizem will be held due to heart rate being in 70s. Add hydralazine, Imdur and Aldactone.  2. Acute on chronic diastolic dysfunction. IV lasix 60 mg today and will reassess tomorrow in AM.  3. Hypothyroidism. Continue Synthroid.  4. Dementia.  continuing home medication.  5. Atrial fibrillation Not on anticoagulation due to [place].  Nutrition: cardiac diet DVT Prophylaxis: mechanical compression device  Advance goals of care discussion: DNR DNI   Consults: none  Family Communication: family was present at bedside, at the time of interview.  Opportunity was given to ask question and all questions were answered satisfactorily.  Disposition: Admitted as observation, telemetry unit. Likely to be discharge home, in 2 days with home health.  Author: Berle Mull, MD [facility] Pager: [phone] 01/05/2016  If 7PM-7AM, please contact night-coverage [email or web address] Password [email or web address]

## 2016-01-05 NOTE — ED Notes (Signed)
Pt can go to floor at 16:38 

## 2016-01-06 DIAGNOSIS — I10 Essential (primary) hypertension: Secondary | ICD-10-CM | POA: Diagnosis not present

## 2016-01-06 DIAGNOSIS — D649 Anemia, unspecified: Secondary | ICD-10-CM | POA: Diagnosis not present

## 2016-01-06 DIAGNOSIS — I110 Hypertensive heart disease with heart failure: Secondary | ICD-10-CM | POA: Diagnosis not present

## 2016-01-06 DIAGNOSIS — Z85038 Personal history of other malignant neoplasm of large intestine: Secondary | ICD-10-CM | POA: Diagnosis not present

## 2016-01-06 LAB — CBC WITH DIFFERENTIAL/PLATELET
BASOS ABS: 0 10*3/uL (ref 0.0–0.1)
Basophils Relative: 0 %
EOS ABS: 0.1 10*3/uL (ref 0.0–0.7)
Eosinophils Relative: 1 %
HEMATOCRIT: 30 % — AB (ref 36.0–46.0)
Hemoglobin: 10.1 g/dL — ABNORMAL LOW (ref 12.0–15.0)
LYMPHS ABS: 1.3 10*3/uL (ref 0.7–4.0)
Lymphocytes Relative: 19 %
MCH: 20 pg — AB (ref 26.0–34.0)
MCHC: 33.7 g/dL (ref 30.0–36.0)
MCV: 59.5 fL — ABNORMAL LOW (ref 78.0–100.0)
MONO ABS: 0.6 10*3/uL (ref 0.1–1.0)
MONOS PCT: 8 %
NEUTROS ABS: 5.1 10*3/uL (ref 1.7–7.7)
Neutrophils Relative %: 72 %
Platelets: 296 10*3/uL (ref 150–400)
RBC: 5.04 MIL/uL (ref 3.87–5.11)
RDW: 21.9 % — ABNORMAL HIGH (ref 11.5–15.5)
WBC: 7.1 10*3/uL (ref 4.0–10.5)

## 2016-01-06 LAB — COMPREHENSIVE METABOLIC PANEL
ALBUMIN: 3.2 g/dL — AB (ref 3.5–5.0)
ALK PHOS: 124 U/L (ref 38–126)
ALT: 33 U/L (ref 14–54)
ANION GAP: 7 (ref 5–15)
AST: 37 U/L (ref 15–41)
BILIRUBIN TOTAL: 0.8 mg/dL (ref 0.3–1.2)
BUN: 13 mg/dL (ref 6–20)
CALCIUM: 8.6 mg/dL — AB (ref 8.9–10.3)
CO2: 25 mmol/L (ref 22–32)
Chloride: 105 mmol/L (ref 101–111)
Creatinine, Ser: 0.93 mg/dL (ref 0.44–1.00)
GFR, EST NON AFRICAN AMERICAN: 56 mL/min — AB (ref >60)
GLUCOSE: 141 mg/dL — AB (ref 65–99)
Potassium: 3.7 mmol/L (ref 3.5–5.1)
Sodium: 137 mmol/L (ref 135–145)
TOTAL PROTEIN: 6.8 g/dL (ref 6.5–8.1)

## 2016-01-06 LAB — BRAIN NATRIURETIC PEPTIDE: B Natriuretic Peptide: 1178.4 pg/mL — ABNORMAL HIGH (ref 0.0–100.0)

## 2016-01-06 LAB — TROPONIN I

## 2016-01-06 LAB — MAGNESIUM: Magnesium: 1.3 mg/dL — ABNORMAL LOW (ref 1.7–2.4)

## 2016-01-06 MED ORDER — BUTALBITAL-APAP-CAFFEINE 50-325-40 MG PO TABS: 1.0000 | ORAL_TABLET | Freq: Four times a day (QID) | ORAL | Status: DC | PRN

## 2016-01-06 MED ORDER — LORAZEPAM 2 MG/ML IJ SOLN
0.2500 mg | Freq: Once | INTRAMUSCULAR | Status: AC
  Administered 2016-01-06: 0.25 mg via INTRAVENOUS
  Filled 2016-01-06: qty 1

## 2016-01-06 MED ORDER — FUROSEMIDE 10 MG/ML IJ SOLN
40.0000 mg | Freq: Two times a day (BID) | INTRAMUSCULAR | Status: DC
  Administered 2016-01-06 – 2016-01-07 (×4): 40 mg via INTRAVENOUS
  Filled 2016-01-06 (×5): qty 4

## 2016-01-06 MED ORDER — HYDRALAZINE HCL 20 MG/ML IJ SOLN
5.0000 mg | Freq: Once | INTRAMUSCULAR | Status: AC
  Administered 2016-01-06: 5 mg via INTRAVENOUS
  Filled 2016-01-06: qty 1

## 2016-01-06 MED ORDER — MAGNESIUM SULFATE 2 GM/50ML IV SOLN
2.0000 g | Freq: Once | INTRAVENOUS | Status: AC
  Administered 2016-01-06: 2 g via INTRAVENOUS
  Filled 2016-01-06: qty 50

## 2016-01-06 MED ORDER — CLONIDINE HCL 0.2 MG/24HR TD PTWK
0.2000 mg | MEDICATED_PATCH | TRANSDERMAL | Status: DC
  Administered 2016-01-06: 0.2 mg via TRANSDERMAL
  Filled 2016-01-06: qty 1

## 2016-01-06 NOTE — Evaluation (Addendum)
Physical Therapy Evaluation Patient Details Name: SHAQWANA STOAKES MRN: [id number] DOB: [birth year] Today's Date: 01/06/2016   History of Present Illness  82 y.o. female with Past medical history of essential hypertension, diabetes mellitus, chronic diastolic dysfunction, AAA 4.6 cm in size, A. fib not on any anticoagulation due to recurrent fall and intracranial hemorrhage, hypothyroidism, vascular dementia and admitted for accelerated hypertension with diastolic congestive heart failure  Clinical Impression  Pt admitted with above diagnosis. Pt currently with functional limitations due to the deficits listed below (see PT Problem List).  Pt will benefit from skilled PT to increase their independence and safety with mobility to allow discharge to the venue listed below.  Pt assisted OOB and very unsteady without UE support.  Pt with improved stability with RW however did have urinary incontinence and attempted to sit down in hallway without any chair present.  Recommend 24/7 assist at home for safety as pt presents as high fall risk.     Follow Up Recommendations Home health PT;Supervision/Assistance - 24 hour    Equipment Recommendations  None recommended by PT    Recommendations for Other Services       Precautions / Restrictions Precautions Precautions: Fall Precaution Comments: incontinent      Mobility  Bed Mobility Overal bed mobility: Needs Assistance Bed Mobility: Supine to Sit     Supine to sit: Supervision        Transfers Overall transfer level: Needs assistance Equipment used: Rolling walker (2 wheeled) Transfers: Sit to/from Stand Sit to Stand: Min assist         General transfer comment: slight assist to steady upon rise, verbal cues for hand placement  Ambulation/Gait Ambulation/Gait assistance: Min assist Ambulation Distance (Feet): 120 Feet Assistive device: Rolling walker (2 wheeled) Gait Pattern/deviations: Step-through pattern;Decreased stride  length Gait velocity: decr   General Gait Details: ambulated to doorway and pt had urinary incontinence and attempting to sit without chair, RN stayed with pt while PT cleaned floor and had recliner pulled up to pt for short rest break, pt then ambulated in hallway, improved steadiness with RW, very slow pace  Stairs            Wheelchair Mobility    Modified Rankin (Stroke Patients Only)       Balance Overall balance assessment: History of Falls (pt denies falls however recurrent falls in chart)                                           Pertinent Vitals/Pain Pain Assessment: No/denies pain  RN reports pt's BP has been better this afternoon and approved mobility.    Home Living Family/patient expects to be discharged to:: Private residence Living Arrangements: Children (son) Available Help at Discharge: Family;Available PRN/intermittently Type of Home: House       Home Layout: One level Home Equipment: Walker - 2 wheels      Prior Function Level of Independence: Independent               Hand Dominance        Extremity/Trunk Assessment               Lower Extremity Assessment: Generalized weakness         Communication   Communication: No difficulties  Cognition Arousal/Alertness: Awake/alert     Area of Impairment: Safety/judgement  Safety/Judgement: Decreased awareness of safety     General Comments: pt had urinary incontinence while ambulating in hallway and was attempting to sit down next to wall (without chair)    General Comments      Exercises        Assessment/Plan    PT Assessment Patient needs continued PT services  PT Diagnosis Difficulty walking   PT Problem List Decreased strength;Decreased activity tolerance;Decreased balance;Decreased mobility;Decreased safety awareness;Decreased knowledge of use of DME  PT Treatment Interventions DME instruction;Gait training;Patient/family  education;Functional mobility training;Therapeutic activities;Therapeutic exercise;Balance training   PT Goals (Current goals can be found in the Care Plan section) Acute Rehab PT Goals PT Goal Formulation: With patient Time For Goal Achievement: 01/13/16 Potential to Achieve Goals: Good    Frequency Min 3X/week   Barriers to discharge        Co-evaluation               End of Session Equipment Utilized During Treatment: Gait belt Activity Tolerance: Patient tolerated treatment well Patient left: in chair;with call bell/phone within reach;with chair alarm set;with family/visitor present Nurse Communication: Mobility status    Functional Assessment Tool Used: clinical judgement Functional Limitation: Mobility: Walking and moving around Mobility: Walking and Moving Around Current Status [phone]): At least 20 percent but less than 40 percent impaired, limited or restricted Mobility: Walking and Moving Around Goal Status [phone]): At least 1 percent but less than 20 percent impaired, limited or restricted    Time: 1310-1330 PT Time Calculation (min) (ACUTE ONLY): 20 min   Charges:   PT Evaluation $PT Eval Moderate Complexity: 1 Procedure     PT G Codes:   PT G-Codes **NOT FOR INPATIENT CLASS** Functional Assessment Tool Used: clinical judgement Functional Limitation: Mobility: Walking and moving around Mobility: Walking and Moving Around Current Status [id number]): At least 20 percent but less than 40 percent impaired, limited or restricted Mobility: Walking and Moving Around Goal Status [phone]): At least 1 percent but less than 20 percent impaired, limited or restricted    Gerry Blanchfield,KATHrine E 01/06/2016, 1:44 PM Carmelia Bake, PT, DPT 01/06/2016 Pager: [phone]

## 2016-01-06 NOTE — Progress Notes (Signed)
Patient c/o SOB. Vitals were: 97.51F:HR 87:RR24:191/108: 97% room air.  PCP was notified.

## 2016-01-06 NOTE — Progress Notes (Signed)
[facility] Progress Note  Patient: Nicole [name] [id number]   PCP: Hollace Kinnier, DO DOB: 06-05-1931   DOA: 01/05/2016   DOS: 01/06/2016   Date of Service: the patient was seen and examined on 01/06/2016  Subjective: Patient complains of headache. This is chronic. Denies any other acute complaint. Shortness of breath is getting better. Strength is also getting better. No nausea no vomiting. Nutrition: Tolerating oral diet  Brief hospital course: Pt. with PMH of HTN, DM, CHF, AAA; admitted on 01/05/2016, with complaint of shortness of breath, was found to have acute on chronic CHF with hypertensive urgency. Currently further plan is continue IV Lasix.  Assessment and Plan: 1. Accelerated hypertension with diastolic congestive heart failure, NYHA class 3 (HCC) Blood pressure significantly elevated. Appears to be mildly volume overloaded as well. Serial 60 mg Lasix yesterday. Will give patient 40 mg IV Lasix twice a day. Continue rest of the medication, Cardizem will be held due to heart rate being in 70s. Add hydralazine, Imdur and Aldactone. Twitching oral clonidine to clonidine patch for consistent drug delivery as the patient has been having episodic hypertension with normal blood pressure in between.  2. Acute on chronic diastolic dysfunction. Daily I would Lasix 40 mg twice a day. Reassess in the morning.  3. Hypothyroidism. Continue Synthroid.  4. Dementia.  continuing home medication.  5. Atrial fibrillation Not on anticoagulation due to [place].  Pain management: When necessary Tylenol, when necessary Fioricet Activity: Home health per physical therapy Bowel regimen: last BM prior to admission Diet: Cardiac diet DVT Prophylaxis: mechanical compression device.  Advance goals of care discussion: DNR/DNI  Family Communication: family was present at bedside, at the time of interview. The pt provided permission to discuss medical plan with the family. Opportunity  was given to ask question and all questions were answered satisfactorily.   Disposition:  Discharge to home with home health. Expected discharge date: 01/07/2016, pending improvement in oxygenation  Consultants: None Procedures: None  Antibiotics: Anti-infectives    None        Intake/Output Summary (Last 24 hours) at 01/06/16 1631 Last data filed at 01/06/16 1556  Gross per 24 hour  Intake    440 ml  Output    800 ml  Net   -360 ml   Filed Weights   01/05/16 1853 01/06/16 0546  Weight: 67.722 kg (149 lb 4.8 oz) 66.407 kg (146 lb 6.4 oz)    Objective: Physical Exam: Filed Vitals:   01/06/16 0231 01/06/16 0500 01/06/16 0546 01/06/16 1422  BP: 127/68   149/86  Pulse: 72   75  Temp:  98.2 F (36.8 C)  98.2 F (36.8 C)  TempSrc:  Oral  Oral  Resp:   18 17  Height:      Weight:   66.407 kg (146 lb 6.4 oz)   SpO2:   99% 98%    General: Alert, Awake and Oriented to Time, Place and Person. Appear in mild distress Eyes: PERRL, Conjunctiva normal ENT: Oral Mucosa clear moist. Neck: mild JVD, no Abnormal Mass Or lumps Cardiovascular: S1 and S2 Present, aortic systolic Murmur, Respiratory: Bilateral Air entry equal and Decreased, basal Crackles, no wheezes Abdomen: Bowel Sound present, Soft and no tenderness Skin: no redness, no Rash  Extremities: bilateral Pedal edema, no calf tenderness Neurologic: Grossly no focal neuro deficit. Bilaterally Equal motor strength  Data Reviewed: CBC:  Recent Labs Lab 01/05/16 1426 01/06/16 0626  WBC 7.1 7.1  NEUTROABS  --  5.1  HGB  10.2* 10.1*  HCT 30.9* 30.0*  MCV 61.3* 59.5*  PLT 273 0000000   Basic Metabolic Panel:  Recent Labs Lab 01/05/16 1426 01/06/16 0626  NA 139 137  K 3.3* 3.7  CL 106 105  CO2 26 25  GLUCOSE 160* 141*  BUN 14 13  CREATININE 1.22* 0.93  CALCIUM 8.5* 8.6*  MG  --  1.3*    Liver Function Tests:  Recent Labs Lab 01/05/16 1426 01/06/16 0626  AST 73* 37  ALT 38 33  ALKPHOS 134* 124    BILITOT 0.7 0.8  PROT 6.8 6.8  ALBUMIN 3.2* 3.2*   No results for input(s): LIPASE, AMYLASE in the last 168 hours. No results for input(s): AMMONIA in the last 168 hours. Coagulation Profile: No results for input(s): INR, PROTIME in the last 168 hours. Cardiac Enzymes:  Recent Labs Lab 01/05/16 1426 01/05/16 2015 01/06/16 0024 01/06/16 0626  TROPONINI <0.03 <0.03 <0.03 <0.03   BNP (last 3 results) No results for input(s): PROBNP in the last 8760 hours.  CBG: No results for input(s): GLUCAP in the last 168 hours.  Studies: No results found.   Scheduled Meds: . allopurinol  100 mg Oral Daily  . aspirin EC  81 mg Oral Daily  . cloNIDine  0.2 mg Transdermal Weekly  . donepezil  10 mg Oral QHS  . furosemide  40 mg Intravenous BID  . hydrALAZINE  50 mg Oral Q8H  . isosorbide mononitrate  30 mg Oral Daily  . levothyroxine  50 mcg Oral QAC breakfast  . metoprolol  75 mg Oral BID  . sodium chloride flush  3 mL Intravenous Q12H  . spironolactone  25 mg Oral Daily   Continuous Infusions:  PRN Meds: sodium chloride, acetaminophen, butalbital-acetaminophen-caffeine, feeding supplement (GLUCERNA SHAKE), ondansetron (ZOFRAN) IV, senna-docusate, sodium chloride flush  Time spent: 30 minutes  Author: Berle Mull, MD [facility] Pager: [phone] 01/06/2016 4:31 PM  If 7PM-7AM, please contact night-coverage at [email or web address], password [facility]

## 2016-01-06 NOTE — Care Management Note (Signed)
Case Management Note  Patient Details  Name: Nicole [name] MRN: [id number] Date of Birth: [birth year]  Subjective/Objective:  Spoke to [place] covering-states patient has an out of pocket cost for [facility].[name] rep will talk to patient & confirm if still wants HHC-await [facility] rep's response.                  Action/Plan:d/c home w/HHC   Expected Discharge Date:   (unknown)               Expected Discharge Plan:  [place]  In-House Referral:     Discharge planning Services     Post Acute Care Choice:  Resumption of Svcs/PTA Provider, Durable Medical Equipment (Private duty care during the day;family @ HS. Has cane,rw) Choice offered to:  Patient  DME Arranged:    DME Agency:     HH Arranged:    HH Agency:     Status of Service:  In process, will continue to follow  Medicare Important Message Given:    Date Medicare IM Given:    Medicare IM give by:    Date Additional Medicare IM Given:    Additional Medicare Important Message give by:     If discussed at [place] of Stay Meetings, dates discussed:    Additional Comments:  Dessa Phi, RN 01/06/2016, 2:53 PM

## 2016-01-06 NOTE — Care Management Note (Signed)
Case Management Note  Patient Details  Name: ARITZEL GEERDES MRN: [id number] Date of Birth: January 21, 1931  Subjective/Objective: 82 y/o f admitted HTN. From home alone, has cane, rw, has private duty care, family supportive.Used [facility] in past choose to use them again if ordered-rep Tim aware.PT cons-await recc.Recc HHRN-disease mgmnt.                   Action/Plan:d/c home   Expected Discharge Date:   (unknown)               Expected Discharge Plan:  [place]  In-House Referral:     Discharge planning Services     Post Acute Care Choice:  Resumption of Svcs/PTA Provider, Durable Medical Equipment (Private duty care during the day;family @ HS. Has cane,rw) Choice offered to:  Patient  DME Arranged:    DME Agency:     HH Arranged:    HH Agency:     Status of Service:  In process, will continue to follow  Medicare Important Message Given:    Date Medicare IM Given:    Medicare IM give by:    Date Additional Medicare IM Given:    Additional Medicare Important Message give by:     If discussed at [place] of Stay Meetings, dates discussed:    Additional Comments:  Dessa Phi, RN 01/06/2016, 11:02 AM

## 2016-01-06 NOTE — Care Management Obs Status (Signed)
[place] NOTIFICATION   Patient Details  Name: JO-ANNE OBRION MRN: [id number] Date of Birth: [birth year]   Medicare Observation Status Notification Given:  Yes    MahabirJuliann Pulse, RN 01/06/2016, 11:56 AM

## 2016-01-06 NOTE — Care Management Note (Signed)
Case Management Note  Patient Details  Name: Nicole [name] MRN: [id number] Date of Birth: 1931-04-19  Subjective/Objective: Noted on 02, if home 02 needed, can arrange-home 02 eval put in.                   Action/Plan:   Expected Discharge Date:   (unknown)               Expected Discharge Plan:  [place]  In-House Referral:     Discharge planning Services     Post Acute Care Choice:  Resumption of Svcs/PTA Provider, Durable Medical Equipment (Private duty care during the day;family @ HS. Has cane,rw) Choice offered to:  Patient  DME Arranged:    DME Agency:     HH Arranged:    HH Agency:     Status of Service:  In process, will continue to follow  Medicare Important Message Given:    Date Medicare IM Given:    Medicare IM give by:    Date Additional Medicare IM Given:    Additional Medicare Important Message give by:     If discussed at [place] of Stay Meetings, dates discussed:    Additional Comments:  Dessa Phi, RN 01/06/2016, 11:18 AM

## 2016-01-07 ENCOUNTER — Observation Stay (HOSPITAL_COMMUNITY): Payer: Medicare Other

## 2016-01-07 DIAGNOSIS — R1084 Generalized abdominal pain: Secondary | ICD-10-CM | POA: Diagnosis not present

## 2016-01-07 DIAGNOSIS — R1013 Epigastric pain: Secondary | ICD-10-CM | POA: Diagnosis not present

## 2016-01-07 DIAGNOSIS — I10 Essential (primary) hypertension: Secondary | ICD-10-CM | POA: Diagnosis not present

## 2016-01-07 DIAGNOSIS — I482 Chronic atrial fibrillation: Secondary | ICD-10-CM | POA: Diagnosis not present

## 2016-01-07 DIAGNOSIS — I110 Hypertensive heart disease with heart failure: Secondary | ICD-10-CM | POA: Diagnosis not present

## 2016-01-07 LAB — BASIC METABOLIC PANEL
Anion gap: 11 (ref 5–15)
BUN: 11 mg/dL (ref 6–20)
CHLORIDE: 100 mmol/L — AB (ref 101–111)
CO2: 26 mmol/L (ref 22–32)
CREATININE: 0.93 mg/dL (ref 0.44–1.00)
Calcium: 8.8 mg/dL — ABNORMAL LOW (ref 8.9–10.3)
GFR, EST NON AFRICAN AMERICAN: 56 mL/min — AB (ref >60)
Glucose, Bld: 140 mg/dL — ABNORMAL HIGH (ref 65–99)
POTASSIUM: 3.2 mmol/L — AB (ref 3.5–5.1)
SODIUM: 137 mmol/L (ref 135–145)

## 2016-01-07 LAB — MAGNESIUM: MAGNESIUM: 1.5 mg/dL — AB (ref 1.7–2.4)

## 2016-01-07 LAB — LIPASE, BLOOD: LIPASE: 51 U/L (ref 11–51)

## 2016-01-07 MED ORDER — POLYETHYLENE GLYCOL 3350 17 G PO PACK
17.0000 g | PACK | Freq: Every day | ORAL | Status: DC
  Administered 2016-01-07 – 2016-01-08 (×2): 17 g via ORAL
  Filled 2016-01-07 (×2): qty 1

## 2016-01-07 MED ORDER — ZOLPIDEM TARTRATE 5 MG PO TABS
5.0000 mg | ORAL_TABLET | Freq: Once | ORAL | Status: AC
Start: 2016-01-07 — End: 2016-01-07
  Administered 2016-01-07: 5 mg via ORAL
  Filled 2016-01-07: qty 1

## 2016-01-07 MED ORDER — LISINOPRIL 20 MG PO TABS: 20.0000 mg | ORAL_TABLET | Freq: Every day | ORAL | Status: DC

## 2016-01-07 MED ORDER — MECLIZINE HCL 12.5 MG PO TABS
12.5000 mg | ORAL_TABLET | Freq: Three times a day (TID) | ORAL | Status: DC | PRN
  Filled 2016-01-07: qty 1

## 2016-01-07 MED ORDER — DILTIAZEM HCL 60 MG PO TABS
60.0000 mg | ORAL_TABLET | Freq: Three times a day (TID) | ORAL | Status: DC
  Administered 2016-01-07 – 2016-01-08 (×4): 60 mg via ORAL
  Filled 2016-01-07 (×4): qty 1

## 2016-01-07 MED ORDER — POTASSIUM CHLORIDE CRYS ER 20 MEQ PO TBCR
40.0000 meq | EXTENDED_RELEASE_TABLET | Freq: Once | ORAL | Status: AC
  Administered 2016-01-07: 40 meq via ORAL
  Filled 2016-01-07: qty 2

## 2016-01-07 MED ORDER — MAGNESIUM SULFATE 2 GM/50ML IV SOLN
2.0000 g | Freq: Once | INTRAVENOUS | Status: AC
  Administered 2016-01-07: 2 g via INTRAVENOUS
  Filled 2016-01-07: qty 50

## 2016-01-07 NOTE — Progress Notes (Signed)
[facility] Progress Note  Patient: Nicole [name] [id number]   PCP: Hollace Kinnier, DO DOB: 1931-07-01   DOA: 01/05/2016   DOS: 01/07/2016   Date of Service: the patient was seen and examined on 01/07/2016  Subjective: Patient remains mildly elevated. Patient did complain about some abdominal pain as well as nausea this morning. Also complained of dizziness as well. Headache is improving. Shortness of breath is also getting better.  Nutrition: Tolerating oral diet  Brief hospital course: Pt. with PMH of HTN, DM, CHF, AAA; admitted on 01/05/2016, with complaint of shortness of breath, was found to have acute on chronic CHF with hypertensive urgency. Currently further plan is continue IV Lasix.  Assessment and Plan: 1. Accelerated hypertension with diastolic congestive heart failure, NYHA class 3 (HCC) Blood pressure significantly elevated. Appears to be mildly volume overloaded as well. Will give patient 40 mg IV Lasix twice a day. Continue rest of the medication, Cardizem will be resumed at present due to tachycardia  Add hydralazine, Imdur and Aldactone. Switching oral clonidine to clonidine patch for consistent drug delivery as the patient has been having episodic hypertension with normal blood pressure in between.  2. Acute on chronic diastolic dysfunction. Lasix 40 mg twice a day. Reassess in the morning.  3. Hypothyroidism. Continue Synthroid.  4. Dementia.  continuing home medication.  5. Atrial fibrillation Not on anticoagulation due to [place].  6. Abdominal pain. Normal x-ray abdomen, lipase level normal, tolerating 100% diet. Continue to monitor. Add Mylanta.  Pain management: When necessary Tylenol, when necessary Fioricet Activity: Home health per physical therapy Bowel regimen: last BM prior to admission, stool softeners added Diet: Cardiac diet DVT Prophylaxis: mechanical compression device.  Advance goals of care discussion: DNR/DNI  Family  Communication: family was present at bedside, at the time of interview. The pt provided permission to discuss medical plan with the family. Opportunity was given to ask question and all questions were answered satisfactorily.   Disposition:  Discharge to home with home health. Expected discharge date: 01/08/2016, pending improvement in blood pressure  Consultants: None Procedures: None  Antibiotics: Anti-infectives    None        Intake/Output Summary (Last 24 hours) at 01/07/16 1810 Last data filed at 01/07/16 1500  Gross per 24 hour  Intake    420 ml  Output   1450 ml  Net  -1030 ml   Filed Weights   01/05/16 1853 01/06/16 0546 01/07/16 0618  Weight: 67.722 kg (149 lb 4.8 oz) 66.407 kg (146 lb 6.4 oz) 64.093 kg (141 lb 4.8 oz)    Objective: Physical Exam: Filed Vitals:   01/06/16 2206 01/07/16 0618 01/07/16 1334 01/07/16 1347  BP: 178/92  146/77   Pulse: 80 89 62 105  Temp: 98.5 F (36.9 C) 98.6 F (37 C) 97.8 F (36.6 C)   TempSrc: Oral Oral Oral   Resp: 18  18   Height:      Weight:  64.093 kg (141 lb 4.8 oz)    SpO2: 96% 99% 99%     General: Alert, Awake and Oriented to Time, Place and Person. Appear in mild distress Eyes: PERRL, Conjunctiva normal ENT: Oral Mucosa clear moist. Neck: mild JVD, no Abnormal Mass Or lumps Cardiovascular: S1 and S2 Present, aortic systolic Murmur, Respiratory: Bilateral Air entry equal and Decreased, basal Crackles, no wheezes Abdomen: Bowel Sound present, Soft and no tenderness Skin: no redness, no Rash  Extremities: bilateral Pedal edema, no calf tenderness Neurologic: Grossly no focal neuro  deficit. Bilaterally Equal motor strength  Data Reviewed: CBC:  Recent Labs Lab 01/05/16 1426 01/06/16 0626  WBC 7.1 7.1  NEUTROABS  --  5.1  HGB 10.2* 10.1*  HCT 30.9* 30.0*  MCV 61.3* 59.5*  PLT 273 0000000   Basic Metabolic Panel:  Recent Labs Lab 01/05/16 1426 01/06/16 0626 01/07/16 0521  NA 139 137 137  K 3.3* 3.7  3.2*  CL 106 105 100*  CO2 26 25 26   GLUCOSE 160* 141* 140*  BUN 14 13 11   CREATININE 1.22* 0.93 0.93  CALCIUM 8.5* 8.6* 8.8*  MG  --  1.3* 1.5*    Liver Function Tests:  Recent Labs Lab 01/05/16 1426 01/06/16 0626  AST 73* 37  ALT 38 33  ALKPHOS 134* 124  BILITOT 0.7 0.8  PROT 6.8 6.8  ALBUMIN 3.2* 3.2*    Recent Labs Lab 01/07/16 0921  LIPASE 51   No results for input(s): AMMONIA in the last 168 hours. Coagulation Profile: No results for input(s): INR, PROTIME in the last 168 hours. Cardiac Enzymes:  Recent Labs Lab 01/05/16 1426 01/05/16 2015 01/06/16 0024 01/06/16 0626  TROPONINI <0.03 <0.03 <0.03 <0.03   BNP (last 3 results) No results for input(s): PROBNP in the last 8760 hours.  CBG: No results for input(s): GLUCAP in the last 168 hours.  Studies: Dg Abd Portable 1v  01/07/2016  CLINICAL DATA:  Generalized abdominal pain. EXAM: PORTABLE ABDOMEN - 1 VIEW COMPARISON:  Radiograph of April 10, 2015. FINDINGS: The bowel gas pattern is normal. Status post cholecystectomy. No radio-opaque calculi or other significant radiographic abnormality are seen. IMPRESSION: No evidence of bowel obstruction or ileus. Electronically Signed   By: Marijo Conception, M.D.   On: 01/07/2016 09:43     Scheduled Meds: . allopurinol  100 mg Oral Daily  . aspirin EC  81 mg Oral Daily  . cloNIDine  0.2 mg Transdermal Weekly  . diltiazem  60 mg Oral Q8H  . donepezil  10 mg Oral QHS  . furosemide  40 mg Intravenous BID  . hydrALAZINE  50 mg Oral Q8H  . isosorbide mononitrate  30 mg Oral Daily  . levothyroxine  50 mcg Oral QAC breakfast  . metoprolol  75 mg Oral BID  . polyethylene glycol  17 g Oral Daily  . sodium chloride flush  3 mL Intravenous Q12H  . spironolactone  25 mg Oral Daily   Continuous Infusions:  PRN Meds: sodium chloride, acetaminophen, butalbital-acetaminophen-caffeine, feeding supplement (GLUCERNA SHAKE), meclizine, ondansetron (ZOFRAN) IV,  senna-docusate, sodium chloride flush  Time spent: 30 minutes  Author: Berle Mull, MD [facility] Pager: [phone] 01/07/2016 6:10 PM  If 7PM-7AM, please contact night-coverage at [email or web address], password [facility]

## 2016-01-08 DIAGNOSIS — I482 Chronic atrial fibrillation: Secondary | ICD-10-CM | POA: Diagnosis not present

## 2016-01-08 DIAGNOSIS — E038 Other specified hypothyroidism: Secondary | ICD-10-CM | POA: Diagnosis not present

## 2016-01-08 DIAGNOSIS — I110 Hypertensive heart disease with heart failure: Secondary | ICD-10-CM | POA: Diagnosis not present

## 2016-01-08 DIAGNOSIS — I10 Essential (primary) hypertension: Secondary | ICD-10-CM | POA: Diagnosis not present

## 2016-01-08 LAB — BASIC METABOLIC PANEL
ANION GAP: 10 (ref 5–15)
BUN: 18 mg/dL (ref 6–20)
CHLORIDE: 99 mmol/L — AB (ref 101–111)
CO2: 29 mmol/L (ref 22–32)
CREATININE: 1.23 mg/dL — AB (ref 0.44–1.00)
Calcium: 9.1 mg/dL (ref 8.9–10.3)
GFR calc non Af Amer: 40 mL/min — ABNORMAL LOW (ref >60)
GFR, EST AFRICAN AMERICAN: 46 mL/min — AB (ref >60)
Glucose, Bld: 144 mg/dL — ABNORMAL HIGH (ref 65–99)
POTASSIUM: 3.3 mmol/L — AB (ref 3.5–5.1)
SODIUM: 138 mmol/L (ref 135–145)

## 2016-01-08 LAB — CBC
HEMATOCRIT: 34.2 % — AB (ref 36.0–46.0)
HEMOGLOBIN: 11.4 g/dL — AB (ref 12.0–15.0)
MCH: 19.9 pg — ABNORMAL LOW (ref 26.0–34.0)
MCHC: 33.3 g/dL (ref 30.0–36.0)
MCV: 59.7 fL — ABNORMAL LOW (ref 78.0–100.0)
Platelets: 352 10*3/uL (ref 150–400)
RBC: 5.73 MIL/uL — AB (ref 3.87–5.11)
RDW: 21.8 % — ABNORMAL HIGH (ref 11.5–15.5)
WBC: 8.6 10*3/uL (ref 4.0–10.5)

## 2016-01-08 LAB — MAGNESIUM: MAGNESIUM: 1.5 mg/dL — AB (ref 1.7–2.4)

## 2016-01-08 MED ORDER — METOPROLOL TARTRATE 50 MG PO TABS
100.0000 mg | ORAL_TABLET | Freq: Two times a day (BID) | ORAL | Status: DC
  Administered 2016-01-08: 100 mg via ORAL
  Filled 2016-01-08: qty 2

## 2016-01-08 MED ORDER — FUROSEMIDE 40 MG PO TABS: 40.0000 mg | ORAL_TABLET | Freq: Every day | ORAL | Status: DC

## 2016-01-08 MED ORDER — POTASSIUM CHLORIDE CRYS ER 20 MEQ PO TBCR
40.0000 meq | EXTENDED_RELEASE_TABLET | Freq: Once | ORAL | Status: AC
  Administered 2016-01-08: 40 meq via ORAL
  Filled 2016-01-08: qty 2

## 2016-01-08 MED ORDER — POLYETHYLENE GLYCOL 3350 17 G PO PACK: 17.0000 g | PACK | Freq: Every day | ORAL | Status: DC

## 2016-01-08 MED ORDER — ISOSORBIDE MONONITRATE ER 30 MG PO TB24: 30.0000 mg | ORAL_TABLET | Freq: Every day | ORAL | Status: DC

## 2016-01-08 MED ORDER — METOPROLOL TARTRATE 50 MG PO TABS: 100.0000 mg | ORAL_TABLET | Freq: Two times a day (BID) | ORAL | Status: DC

## 2016-01-08 MED ORDER — BUTALBITAL-APAP-CAFFEINE 50-325-40 MG PO TABS: 1.0000 | ORAL_TABLET | Freq: Four times a day (QID) | ORAL | Status: DC | PRN

## 2016-01-08 MED ORDER — MECLIZINE HCL 12.5 MG PO TABS: 12.5000 mg | ORAL_TABLET | Freq: Three times a day (TID) | ORAL | Status: DC | PRN

## 2016-01-08 MED ORDER — CLONIDINE HCL 0.2 MG/24HR TD PTWK: 0.2000 mg | MEDICATED_PATCH | TRANSDERMAL | Status: DC

## 2016-01-08 MED ORDER — HYDRALAZINE HCL 50 MG PO TABS: 50.0000 mg | ORAL_TABLET | Freq: Three times a day (TID) | ORAL | Status: DC

## 2016-01-08 MED ORDER — FUROSEMIDE 20 MG PO TABS: 20.0000 mg | ORAL_TABLET | Freq: Every day | ORAL | Status: DC

## 2016-01-08 MED ORDER — MAGNESIUM SULFATE 2 GM/50ML IV SOLN
2.0000 g | Freq: Once | INTRAVENOUS | Status: AC
  Administered 2016-01-08: 2 g via INTRAVENOUS
  Filled 2016-01-08: qty 50

## 2016-01-08 MED ORDER — SPIRONOLACTONE 25 MG PO TABS: 25.0000 mg | ORAL_TABLET | Freq: Every day | ORAL | Status: DC

## 2016-01-08 NOTE — Care Management Note (Addendum)
Case Management Note  Patient Details  Name: Nicole [name] MRN: [id number] Date of Birth: 1931/05/08  Subjective/Objective:       CHF, HTN             Action/Plan: Discharge Planning: AVS reviewed: Please see previous NCM notes  Contacted [facility] to make aware of scheduled dc home today with [facility] PT  PCP- Hollace Kinnier MD Expected Discharge Date:  01/08/2016              Expected Discharge Plan:  [place]  In-House Referral:  NA  Discharge planning Services  CM Consult  Post Acute Care Choice:  Resumption of Svcs/PTA Provider, Durable Medical Equipment, Home Health (Private duty care during the day;family @ HS. Has cane,rw) Choice offered to:  Patient  DME Arranged:  N/A DME Agency:  NA  HH Arranged:  PT HH Agency:  [place]  Status of Service:  Completed, signed off  Medicare Important Message Given:    Date Medicare IM Given:    Medicare IM give by:    Date Additional Medicare IM Given:    Additional Medicare Important Message give by:     If discussed at [place] of Stay Meetings, dates discussed:    Additional Comments:  Erenest Rasher, RN 01/08/2016, 1:15 PM

## 2016-01-08 NOTE — Discharge Summary (Signed)
[facility] Discharge Summary   Patient: Nicole [name] FHQ:[id number]   PCP: Hollace Kinnier, DO DOB: 11-10-1930   Date of admission: 01/05/2016   Date of discharge: 01/08/2016     Discharge Diagnoses:  Principal Problem:   Accelerated hypertension with diastolic congestive heart failure, NYHA class 3 (HCC) Active Problems:   History of colon cancer, stage III   Hypothyroidism   Chronic anemia   Hypothyroidism due to acquired atrophy of thyroid   Essential hypertension   Atrial fibrillation (HCC)   Abdominal pain, epigastric  Admitted From: Home Disposition:  Home with home health  Recommendations for Outpatient Follow-up:  1. Please follow-up with PCP in one week for blood pressure recheck   Follow-up Information    Follow up with [name], [name], DO. Schedule an appointment as soon as possible for a visit in 1 week.   Specialty:  Geriatric Medicine   Why:  with BMP   Contact information:   [address]. [place] [address] [phone]      Diet recommendation: Cardiac diet  Activity: The patient is advised to gradually reintroduce usual activities.  Discharge Condition: good  Code Status: DNR/DNI  History of present illness: As per the H and P dictated on admission, "Nicole [name] is a 82 y.o. female with Past medical history of Essential hypertension, diabetes mellitus, chronic diastolic dysfunction, AAA 4.6 cm in size, A. fib not on any anticoagulation due to recurrent fall and intracranial hemorrhage, hypothyroidism, vascular dementia. The patient presented with complains of shortness of breath. Patient also had elevated high blood pressure. As per patient she has been having fainting episodes on Sunday which is resolved on its own. She denies having any headache dizziness or lightheadedness or vertigo, no chest pain no shortness of breath no abdominal pain no nausea no vomiting. No diarrhea no constipation. No burning urination. Next and she compresses  about swelling in his legs which is ongoing for last few weeks. She denies any tenderness in the leg. No focal deficit. She has a caregiver who provides her medication therefore she has not missed or skipped any of her medication. She has not been able to receive her Cardizem for last 2 weeks."  Hospital Course:  Summary of her active problems in the hospital is as following. 1. Accelerated hypertension with diastolic congestive heart failure, NYHA class 3 (HCC) Blood pressure significantly elevated. Appears to be mildly volume overloaded as well. She was given IV Lasix with which volume overload improved and resolved Add hydralazine, Imdur and Aldactone. Switching oral clonidine to clonidine patch for consistent drug delivery as the patient has been having episodic hypertension with normal blood pressure in between. When Cardizem was reinitiated the patient had significant bradycardia overnight and therefore I would discontinue Cardizem. Rate control I would increase her Lopressor from 75 mg twice a day 200 mg twice a day. Also patient will be discharged on scheduled Lasix 20 mg daily with 20 mg extra be taken as in when needed  2. Acute on chronic diastolic dysfunction. Patient was given IV Lasix 40 mg twice a day. We will be discharging the patient on 20 mg scheduled Lasix with 20 mg extra for pedal edema and weight gain.  3. Hypothyroidism. Continue Synthroid.  4. Dementia.  continuing home medication.  5. Atrial fibrillation Not on anticoagulation due to Wanda.  6. Abdominal pain. Normal x-ray abdomen, lipase level normal, tolerating 100% diet. Continue to monitor. Add Mylanta.  7. Headache. No focal deficit. Discharging on  Fioricet.  All other chronic medical condition were stable during the hospitalization.  Patient was seen by physical therapy, who recommended home health which was arranged by Education officer, museum and case Freight forwarder. On the day of the discharge the patient's  vitals are stable, and no other acute medical condition were reported by patient. the patient was felt safe to be discharge at home with home health.  Procedures and Results:  None   Consultations:  None  DISCHARGE MEDICATION: Discharge Medication List as of 01/08/2016  1:25 PM    START taking these medications   Details  butalbital-acetaminophen-caffeine (FIORICET, ESGIC) 50-325-40 MG tablet Take 1 tablet by mouth every 6 (six) hours as needed for headache., Starting 01/08/2016, Until Discontinued, Print    cloNIDine (CATAPRES - DOSED IN MG/24 HR) 0.2 mg/24hr patch Place 1 patch (0.2 mg total) onto the skin once a week., Starting 01/08/2016, Until Discontinued, Normal    hydrALAZINE (APRESOLINE) 50 MG tablet Take 1 tablet (50 mg total) by mouth every 8 (eight) hours., Starting 01/08/2016, Until Discontinued, Normal    isosorbide mononitrate (IMDUR) 30 MG 24 hr tablet Take 1 tablet (30 mg total) by mouth daily., Starting 01/08/2016, Until Discontinued, Normal    meclizine (ANTIVERT) 12.5 MG tablet Take 1 tablet (12.5 mg total) by mouth 3 (three) times daily as needed for dizziness., Starting 01/08/2016, Until Discontinued, Normal    polyethylene glycol (MIRALAX / GLYCOLAX) packet Take 17 g by mouth daily., Starting 01/08/2016, Until Discontinued, Normal    spironolactone (ALDACTONE) 25 MG tablet Take 1 tablet (25 mg total) by mouth daily., Starting 01/08/2016, Until Discontinued, Normal      CONTINUE these medications which have CHANGED   Details  furosemide (LASIX) 20 MG tablet Take 1 tablet (20 mg total) by mouth daily. take 20 mg tab extra for more than 2 Lb weight gain in a day or pedal edema along with potassium, Starting 01/08/2016, Until Discontinued, Normal    metoprolol (LOPRESSOR) 50 MG tablet Take 2 tablets (100 mg total) by mouth 2 (two) times daily., Starting 01/08/2016, Until Discontinued, Normal      CONTINUE these medications which have NOT CHANGED   Details  allopurinol  (ZYLOPRIM) 100 MG tablet Take 1 tablet (100 mg total) by mouth daily., Starting 01/05/2016, Until Discontinued, Normal    aspirin EC 81 MG tablet Take 81 mg by mouth daily., Until Discontinued, Historical Med    blood glucose meter kit and supplies Dispense based on patient and insurance preference. Use up to four times daily as directed. (FOR ICD-9 250.00, 250.01)., Print    donepezil (ARICEPT) 10 MG tablet Take 1 tablet (10 mg total) by mouth at bedtime., Starting 10/14/2015, Until Discontinued, Normal    !! feeding supplement, GLUCERNA SHAKE, (GLUCERNA SHAKE) LIQD Take 237 mLs by mouth 2 (two) times daily as needed (for poor appetite)., Until Discontinued, Historical Med    glucose blood (ACCU-CHEK AVIVA) test strip Use to check blood sugar daily. Dx: E11.21, Normal    Lancets (ACCU-CHEK SOFT TOUCH) lancets Use as instructed, Normal    levothyroxine (SYNTHROID, LEVOTHROID) 50 MCG tablet Take one tablet by mouth 30 minutes before breakfast away from other medications for thyroid, Normal    Melatonin-Pyridoxine (MELATIN PO) Take 5 tablets by mouth at bedtime as needed. For sleep, Until Discontinued, Historical Med    pantoprazole sodium (PROTONIX) 40 mg/20 mL PACK Take 40 mg by mouth daily as needed (acid reflux/ heart burn). , Until Discontinued, Historical Med    potassium chloride (K-DUR)  10 MEQ tablet Take 2 tablets (20 mEq total) by mouth daily as needed (take with lasix only.)., Starting 11/15/2015, Until Discontinued, Normal    senna-docusate (SENOKOT-S) 8.6-50 MG tablet Take 1 tablet by mouth 2 (two) times daily., Starting 07/30/2015, Until Discontinued, Normal    !! feeding supplement, ENSURE ENLIVE, (ENSURE ENLIVE) LIQD Take 237 mLs by mouth 2 (two) times daily between meals., Starting 07/30/2015, Until Discontinued, Normal     !! - Potential duplicate medications found. Please discuss with provider.    STOP taking these medications     cloNIDine (CATAPRES) 0.2 MG tablet       diltiazem (CARDIZEM) 60 MG tablet      lisinopril (PRINIVIL,ZESTRIL) 20 MG tablet        Allergies  Allergen Reactions  . Iohexol      Code: VOM, Desc: PT REPORTS VOMITING W/ IVP DYE- ARS 12/26/08, Onset Date: [id number]   . Z-Pak [Azithromycin]   . Iodinated Diagnostic Agents Nausea Only    PT HAS HAD SINGULAR INCIDENT OF NAUSEA  W/ IV CONTYRAST INJECTION, NONE IF INJECTED SLOWLY//A.CALHOUN   Discharge Instructions    Diet - low sodium heart healthy    Complete by:  As directed      Discharge instructions    Complete by:  As directed   It is important that you read following instructions as well as go over your medication list with RN to help you understand your care after this hospitalization.  Discharge Instructions: Please follow-up with PCP in one week  Please request your primary care physician to go over all Hospital Tests and Procedure/Radiological results at the follow up,  Please get all Hospital records sent to your PCP by signing hospital release before you go home.   Do not drive, operating heavy machinery, perform activities at heights, swimming or participation in water activities or provide baby sitting services ; until you have been seen by Primary Care Physician or a Neurologist and advised to do so again. Do not take more than prescribed Pain, Sleep and Anxiety Medications. You were cared for by a hospitalist during your hospital stay. If you have any questions about your discharge medications or the care you received while you were in the hospital after you are discharged, you can call the unit and ask to speak with the hospitalist on call if the hospitalist that took care of you is not available.  Once you are discharged, your primary care physician will handle any further medical issues. Please note that NO REFILLS for any discharge medications will be authorized once you are discharged, as it is imperative that you return to your primary care physician (or establish  a relationship with a primary care physician if you do not have one) for your aftercare needs so that they can reassess your need for medications and monitor your lab values. You Must read complete instructions/literature along with all the possible adverse reactions/side effects for all the Medicines you take and that have been prescribed to you. Take any new Medicines after you have completely understood and accept all the possible adverse reactions/side effects. Wear Seat belts while driving.     Increase activity slowly    Complete by:  As directed           Discharge Exam: Filed Weights   01/05/16 1853 01/06/16 0546 01/07/16 0618  Weight: 67.722 kg (149 lb 4.8 oz) 66.407 kg (146 lb 6.4 oz) 64.093 kg (141 lb 4.8 oz)   Filed Vitals:  01/08/16 0507 01/08/16 1322  BP: 128/97 166/84  Pulse:  74  Temp:  98.3 F (36.8 C)  Resp:  20   General: Appear in no distress, no Rash; Oral Mucosa moist. Cardiovascular: S1 and S2 Present, no Murmur, no JVD Respiratory: Bilateral Air entry present and Clear to Auscultation, no Crackles, no wheezes Abdomen: Bowel Sound present, Soft and no tenderness Extremities: no Pedal edema, no calf tenderness Neurology: Grossly no focal neuro deficit.  The results of significant diagnostics from this hospitalization (including imaging, microbiology, ancillary and laboratory) are listed below for reference.    Significant Diagnostic Studies: Dg Chest Port 1 View  01/05/2016  CLINICAL DATA:  Shortness of Breath EXAM: PORTABLE CHEST 1 VIEW COMPARISON:  Chest radiograph July 27, 2015 and chest CT July 28, 2015 FINDINGS: There is generalized interstitial edema. There are small pleural effusions bilaterally. There is generalized cardiomegaly with pulmonary venous hypertension. There is airspace consolidation in the medial right base. There is atherosclerotic calcification in the aorta. No adenopathy evident. There is degenerative change in the thoracic spine and  in each shoulder. IMPRESSION: Congestive heart failure. There is airspace consolidation in the medial right base which may represent either chronic atelectasis, pleural fluid tracking into this area, or possible superimposed pneumonia. Opacification in this area was present on prior study. Electronically Signed   By: Lowella Grip III M.D.   On: 01/05/2016 14:15   Dg Abd Portable 1v  01/07/2016  CLINICAL DATA:  Generalized abdominal pain. EXAM: PORTABLE ABDOMEN - 1 VIEW COMPARISON:  Radiograph of April 10, 2015. FINDINGS: The bowel gas pattern is normal. Status post cholecystectomy. No radio-opaque calculi or other significant radiographic abnormality are seen. IMPRESSION: No evidence of bowel obstruction or ileus. Electronically Signed   By: Marijo Conception, M.D.   On: 01/07/2016 09:43    Microbiology: No results found for this or any previous visit (from the past 240 hour(s)).   Labs: CBC:  Recent Labs Lab 01/05/16 1426 01/06/16 0626 01/08/16 0508  WBC 7.1 7.1 8.6  NEUTROABS  --  5.1  --   HGB 10.2* 10.1* 11.4*  HCT 30.9* 30.0* 34.2*  MCV 61.3* 59.5* 59.7*  PLT 273 296 419   Basic Metabolic Panel:  Recent Labs Lab 01/05/16 1426 01/06/16 0626 01/07/16 0521 01/08/16 0508  NA 139 137 137 138  K 3.3* 3.7 3.2* 3.3*  CL 106 105 100* 99*  CO2 _0 GLUCOSE 160* 141* 140* 144*  BUN _1 CREATININE 1.22* 0.93 0.93 1.23*  CALCIUM 8.5* 8.6* 8.8* 9.1  MG  --  1.3* 1.5* 1.5*   Liver Function Tests:  Recent Labs Lab 01/05/16 1426 01/06/16 0626  AST 73* 37  ALT 38 33  ALKPHOS 134* 124  BILITOT 0.7 0.8  PROT 6.8 6.8  ALBUMIN 3.2* 3.2*    Recent Labs Lab 01/07/16 0921  LIPASE 51   No results for input(s): AMMONIA in the last 168 hours. Cardiac Enzymes:  Recent Labs Lab 01/05/16 1426 01/05/16 2015 01/06/16 0024 01/06/16 0626  TROPONINI <0.03 <0.03 <0.03 <0.03   BNP (last 3 results)  Recent Labs  07/28/15 1244 01/05/16 1425  01/06/16 0626  BNP 1037.7* 1249.2* 1178.4*   CBG: No results for input(s): GLUCAP in the last 168 hours. Time spent: 30 minutes  Signed:  Berle Mull  [facility] 01/08/2016 , 8:44 PM

## 2016-01-09 ENCOUNTER — Other Ambulatory Visit: Payer: Self-pay | Admitting: *Deleted

## 2016-01-09 DIAGNOSIS — F0150 Vascular dementia without behavioral disturbance: Secondary | ICD-10-CM

## 2016-01-09 MED ORDER — DONEPEZIL HCL 10 MG PO TABS: 10.0000 mg | ORAL_TABLET | Freq: Every day | ORAL | Status: DC

## 2016-01-09 NOTE — Telephone Encounter (Signed)
[facility] 

## 2016-01-10 DIAGNOSIS — I4891 Unspecified atrial fibrillation: Secondary | ICD-10-CM | POA: Diagnosis not present

## 2016-01-10 DIAGNOSIS — M109 Gout, unspecified: Secondary | ICD-10-CM | POA: Diagnosis not present

## 2016-01-10 DIAGNOSIS — I130 Hypertensive heart and chronic kidney disease with heart failure and stage 1 through stage 4 chronic kidney disease, or unspecified chronic kidney disease: Secondary | ICD-10-CM | POA: Diagnosis not present

## 2016-01-10 DIAGNOSIS — I5042 Chronic combined systolic (congestive) and diastolic (congestive) heart failure: Secondary | ICD-10-CM | POA: Diagnosis not present

## 2016-01-10 DIAGNOSIS — D649 Anemia, unspecified: Secondary | ICD-10-CM | POA: Diagnosis not present

## 2016-01-10 DIAGNOSIS — N189 Chronic kidney disease, unspecified: Secondary | ICD-10-CM | POA: Diagnosis not present

## 2016-01-10 DIAGNOSIS — I69318 Other symptoms and signs involving cognitive functions following cerebral infarction: Secondary | ICD-10-CM | POA: Diagnosis not present

## 2016-01-10 DIAGNOSIS — E1122 Type 2 diabetes mellitus with diabetic chronic kidney disease: Secondary | ICD-10-CM | POA: Diagnosis not present

## 2016-01-10 DIAGNOSIS — R2689 Other abnormalities of gait and mobility: Secondary | ICD-10-CM | POA: Diagnosis not present

## 2016-01-10 DIAGNOSIS — M1991 Primary osteoarthritis, unspecified site: Secondary | ICD-10-CM | POA: Diagnosis not present

## 2016-01-11 ENCOUNTER — Telehealth: Payer: Self-pay | Admitting: *Deleted

## 2016-01-11 NOTE — Telephone Encounter (Signed)
Danielle with [facility]) called and requested verbal orders for patient for PT 2x4wks. Patient has a Hospital follow up on the 30th. Verbal orders given.

## 2016-01-16 ENCOUNTER — Other Ambulatory Visit: Payer: Medicare Other

## 2016-01-16 DIAGNOSIS — E1121 Type 2 diabetes mellitus with diabetic nephropathy: Secondary | ICD-10-CM | POA: Diagnosis not present

## 2016-01-17 LAB — BASIC METABOLIC PANEL
BUN/Creatinine Ratio: 14 (ref 12–28)
BUN: 17 mg/dL (ref 8–27)
CO2: 26 mmol/L (ref 18–29)
Calcium: 9 mg/dL (ref 8.7–10.3)
Chloride: 105 mmol/L (ref 96–106)
Creatinine, Ser: 1.19 mg/dL — ABNORMAL HIGH (ref 0.57–1.00)
GFR calc Af Amer: 49 mL/min/{1.73_m2} — ABNORMAL LOW (ref >59)
GFR calc non Af Amer: 43 mL/min/{1.73_m2} — ABNORMAL LOW (ref >59)
Glucose: 117 mg/dL — ABNORMAL HIGH (ref 65–99)
Potassium: 4.4 mmol/L (ref 3.5–5.2)
Sodium: 145 mmol/L — ABNORMAL HIGH (ref 134–144)

## 2016-01-17 LAB — HEMOGLOBIN A1C
Est. average glucose Bld gHb Est-mCnc: 151 mg/dL
Hgb A1c MFr Bld: 6.9 % — ABNORMAL HIGH (ref 4.8–5.6)

## 2016-01-18 DIAGNOSIS — I4891 Unspecified atrial fibrillation: Secondary | ICD-10-CM | POA: Diagnosis not present

## 2016-01-18 DIAGNOSIS — M109 Gout, unspecified: Secondary | ICD-10-CM | POA: Diagnosis not present

## 2016-01-18 DIAGNOSIS — R2689 Other abnormalities of gait and mobility: Secondary | ICD-10-CM | POA: Diagnosis not present

## 2016-01-18 DIAGNOSIS — I130 Hypertensive heart and chronic kidney disease with heart failure and stage 1 through stage 4 chronic kidney disease, or unspecified chronic kidney disease: Secondary | ICD-10-CM | POA: Diagnosis not present

## 2016-01-18 DIAGNOSIS — D649 Anemia, unspecified: Secondary | ICD-10-CM | POA: Diagnosis not present

## 2016-01-18 DIAGNOSIS — M1991 Primary osteoarthritis, unspecified site: Secondary | ICD-10-CM | POA: Diagnosis not present

## 2016-01-18 DIAGNOSIS — E1122 Type 2 diabetes mellitus with diabetic chronic kidney disease: Secondary | ICD-10-CM | POA: Diagnosis not present

## 2016-01-18 DIAGNOSIS — I5042 Chronic combined systolic (congestive) and diastolic (congestive) heart failure: Secondary | ICD-10-CM | POA: Diagnosis not present

## 2016-01-18 DIAGNOSIS — I69318 Other symptoms and signs involving cognitive functions following cerebral infarction: Secondary | ICD-10-CM | POA: Diagnosis not present

## 2016-01-18 DIAGNOSIS — N189 Chronic kidney disease, unspecified: Secondary | ICD-10-CM | POA: Diagnosis not present

## 2016-01-20 ENCOUNTER — Ambulatory Visit (INDEPENDENT_AMBULATORY_CARE_PROVIDER_SITE_OTHER): Payer: Medicare Other | Admitting: Internal Medicine

## 2016-01-20 ENCOUNTER — Encounter: Payer: Self-pay | Admitting: Internal Medicine

## 2016-01-20 VITALS — BP 180/100 | HR 98 | Temp 97.9°F | Wt 149.0 lb

## 2016-01-20 DIAGNOSIS — I10 Essential (primary) hypertension: Secondary | ICD-10-CM | POA: Diagnosis not present

## 2016-01-20 DIAGNOSIS — F0151 Vascular dementia with behavioral disturbance: Secondary | ICD-10-CM

## 2016-01-20 DIAGNOSIS — K5909 Other constipation: Secondary | ICD-10-CM

## 2016-01-20 DIAGNOSIS — I482 Chronic atrial fibrillation, unspecified: Secondary | ICD-10-CM

## 2016-01-20 DIAGNOSIS — I5032 Chronic diastolic (congestive) heart failure: Secondary | ICD-10-CM

## 2016-01-20 DIAGNOSIS — E1121 Type 2 diabetes mellitus with diabetic nephropathy: Secondary | ICD-10-CM | POA: Diagnosis not present

## 2016-01-20 DIAGNOSIS — K5900 Constipation, unspecified: Secondary | ICD-10-CM | POA: Diagnosis not present

## 2016-01-20 MED ORDER — HYDRALAZINE HCL 50 MG PO TABS: 50.0000 mg | ORAL_TABLET | Freq: Three times a day (TID) | ORAL | Status: DC

## 2016-01-20 MED ORDER — MELATONIN 5 MG PO CAPS: 5.0000 mg | ORAL_CAPSULE | Freq: Every evening | ORAL | Status: DC | PRN

## 2016-01-20 MED ORDER — SPIRONOLACTONE 25 MG PO TABS
25.0000 mg | ORAL_TABLET | Freq: Every day | ORAL | Status: DC
Start: 2016-01-20 — End: 2016-08-26

## 2016-01-20 MED ORDER — CLONIDINE HCL 0.2 MG/24HR TD PTWK: 0.2000 mg | MEDICATED_PATCH | TRANSDERMAL | Status: DC

## 2016-01-20 MED ORDER — POLYETHYLENE GLYCOL 3350 17 G PO PACK: 17.0000 g | PACK | Freq: Every day | ORAL | Status: DC | PRN

## 2016-01-20 MED ORDER — METOPROLOL TARTRATE 100 MG PO TABS: 100.0000 mg | ORAL_TABLET | Freq: Two times a day (BID) | ORAL | Status: DC

## 2016-01-20 MED ORDER — ISOSORBIDE MONONITRATE ER 30 MG PO TB24: 30.0000 mg | ORAL_TABLET | Freq: Every day | ORAL | Status: DC

## 2016-01-20 NOTE — Progress Notes (Signed)
Location:  [facility] clinic Provider: Rihaan Barrack L. Mariea Clonts, D.O., C.M.D.  Code Status: DNR Goals of Care:  Advanced Directives 01/20/2016  Does patient have an advance directive? Yes  Type of Advance Directive [place]  Does patient want to make changes to advanced directive? -  Copy of advanced directive(s) in chart? Yes    Chief Complaint  Patient presents with  . Medical Management of Chronic Issues    hospital follow-up    HPI: Patient is a 82 y.o. female seen today for hospital follow-up s/p admission from 6/15-18 with feeling poorly and uncontrolled blood pressure.  She had a boatload of medication changes, but, from how it appears, the Rxs were printed or not sent to pharmacy at discharge.  Family members that are not normally responsible for her care were present with her so it's unclear if the paperwork was lost or left at the hospital.  She comes with her caregiver and one niece today who typically come to appts.    We spent the majority of the visit reviewing the meds and sending new prescriptions.  Pt's bp was still high b/c new meds have not been received and it's now 12 days after discharge.  They had continued the lisinopril and oral clonidine, used the clonidine patch that was on her until it was complete and removed it.    They also report that her weight has trended up from 141 to 149 over the past few days, but they have not given any lasix or potassium.  They were unsure when to give the medication.  Parameters were developed today and provided in instructions.    Past Medical History  Diagnosis Date  . Colon cancer ([place]) 07/1997  . Hypertension   . Diabetes mellitus   . CHF (congestive heart failure) ([place])   . AAA (abdominal aortic aneurysm) ([place])   . Atrial fibrillation ([place])   . Hypercholesteremia   . Kidney stone     renal calculi  . Small bowel obstruction due to adhesions ([place]) 05/16/2012  . Hypothyroidism   . Arthritis   . Anemia   .  Cholelithiasis   . Perforation of bile duct   . Hemorrhage of rectum and anus   . Swelling, mass, or lump in head and neck   . Cervicalgia   . Hypoglycemia, unspecified   . Pain in joint, lower leg   . Anxiety   . Shortness of breath   . Acute upper respiratory infections of unspecified site   . Depression   . Thoracic aneurysm without mention of rupture   . GERD (gastroesophageal reflux disease)   . Proteinuria   . Other specified cardiac dysrhythmias(427.89)   . Congestive heart failure, unspecified   . Electrolyte and fluid disorders not elsewhere classified   . Other chronic allergic conjunctivitis   . Hypopotassemia   . Chronic systolic heart failure ([place])   . Dizziness and giddiness   . Malignant neoplasm of colon, unspecified site   . Other and unspecified hyperlipidemia   . Gout, unspecified   . Atrial fibrillation ([place])   . Aortic aneurysm of unspecified site without mention of rupture   . Pain in joint, site unspecified   . Palpitations   . CKD (chronic kidney disease)   . Diabetes mellitus ([place])   . Stroke [facility])     Past Surgical History  Procedure Laterality Date  . Hemicolectomy  08/11/1997  . Kidney stone surgery    . Colon surgery  to remove colon ca  . Cholecystectomy  05/14/2012    Procedure: LAPAROSCOPIC CHOLECYSTECTOMY WITH INTRAOPERATIVE CHOLANGIOGRAM;  Surgeon: Haywood Lasso, MD;  Location: [place];  Service: General;  Laterality: N/A;  . Ercp  05/17/2012    Procedure: ENDOSCOPIC RETROGRADE CHOLANGIOPANCREATOGRAPHY (ERCP);  Surgeon: Missy Sabins, MD;  Location: [place];  Service: Gastroenterology;  Laterality: N/A;  . Ercp  08/12/2012    Procedure: ENDOSCOPIC RETROGRADE CHOLANGIOPANCREATOGRAPHY (ERCP);  Surgeon: Missy Sabins, MD;  [facility] ENDOSCOPY;  Service: Endoscopy;  Laterality: N/A;  . Transthoracic echocardiogram  11/2009    EF 45-50%, mild-mod AV regurg; mild MR; mod TR; ascending aorta mildly dilated  . Cardiac catheterization  2008      moderate severe pulm HTN; with elevted pulm capillary wedge pressure     Allergies  Allergen Reactions  . Iohexol      Code: VOM, Desc: PT REPORTS VOMITING W/ IVP DYE- ARS 12/26/08, Onset Date: [id number]   . Z-Pak [Azithromycin]   . Iodinated Diagnostic Agents Nausea Only    PT HAS HAD SINGULAR INCIDENT OF NAUSEA  W/ IV CONTYRAST INJECTION, NONE IF INJECTED SLOWLY//A.[place]      Medication List       This list is accurate as of: 01/20/16 10:45 AM.  Always use your most recent med list.               accu-chek soft touch lancets  Use as instructed     allopurinol 100 MG tablet  Commonly known as:  ZYLOPRIM  Take 1 tablet (100 mg total) by mouth daily.     aspirin EC 81 MG tablet  Take 81 mg by mouth daily.     blood glucose meter kit and supplies  Dispense based on patient and insurance preference. Use up to four times daily as directed. (FOR ICD-9 250.00, 250.01).     butalbital-acetaminophen-caffeine 50-325-40 MG tablet  Commonly known as:  FIORICET, ESGIC  Take 1 tablet by mouth every 6 (six) hours as needed for headache.     cloNIDine 0.2 mg/24hr patch  Commonly known as:  CATAPRES - Dosed in mg/24 hr  Place 1 patch (0.2 mg total) onto the skin once a week.     donepezil 10 MG tablet  Commonly known as:  ARICEPT  Take 1 tablet (10 mg total) by mouth at bedtime.     feeding supplement (GLUCERNA SHAKE) Liqd  Take 237 mLs by mouth 2 (two) times daily as needed (for poor appetite).     furosemide 20 MG tablet  Commonly known as:  LASIX  Take 1 tablet (20 mg total) by mouth daily. take 20 mg tab extra for more than 2 Lb weight gain in a day or pedal edema along with potassium     glucose blood test strip  Commonly known as:  ACCU-CHEK AVIVA  Use to check blood sugar daily. Dx: E11.21     isosorbide mononitrate 30 MG 24 hr tablet  Commonly known as:  IMDUR  Take 1 tablet (30 mg total) by mouth daily.     levothyroxine 50 MCG tablet  Commonly known as:   SYNTHROID, LEVOTHROID  Take one tablet by mouth 30 minutes before breakfast away from other medications for thyroid     lisinopril 20 MG tablet  Commonly known as:  PRINIVIL,ZESTRIL     meclizine 12.5 MG tablet  Commonly known as:  ANTIVERT  Take 1 tablet (12.5 mg total) by mouth 3 (three) times daily as needed  for dizziness.     MELATIN PO  Take 5 tablets by mouth at bedtime as needed. For sleep     metoprolol 50 MG tablet  Commonly known as:  LOPRESSOR  Take 2 tablets (100 mg total) by mouth 2 (two) times daily.     polyethylene glycol packet  Commonly known as:  MIRALAX / GLYCOLAX  Take 17 g by mouth daily.     potassium chloride 10 MEQ tablet  Commonly known as:  K-DUR  Take 2 tablets (20 mEq total) by mouth daily as needed (take with lasix only.).     PROTONIX 40 mg/20 mL Pack  Generic drug:  pantoprazole sodium  Take 40 mg by mouth daily as needed (acid reflux/ heart burn).     spironolactone 25 MG tablet  Commonly known as:  ALDACTONE  Take 1 tablet (25 mg total) by mouth daily.        Review of Systems:  Review of Systems  Constitutional: Negative for fever, chills and malaise/fatigue.  HENT: Negative for congestion and hearing loss.   Eyes: Negative for blurred vision.  Respiratory: Negative for cough and shortness of breath.   Cardiovascular: Positive for leg swelling. Negative for chest pain.  Gastrointestinal: Negative for abdominal pain and constipation.  Musculoskeletal: Negative for falls.  Skin: Negative for itching and rash.  Neurological: Negative for dizziness, loss of consciousness, weakness and headaches.  Psychiatric/Behavioral: Positive for memory loss.    Health Maintenance  Topic Date Due  . OPHTHALMOLOGY EXAM  03/22/1941  . ZOSTAVAX  03/21/1993  . INFLUENZA VACCINE  02/21/2016  . FOOT EXAM  02/24/2016  . URINE MICROALBUMIN  06/30/2016  . HEMOGLOBIN A1C  07/17/2016  . MAMMOGRAM  09/08/2016  . TETANUS/TDAP  09/16/2021  . DEXA SCAN   Completed  . PNA vac Low Risk Adult  Completed    Physical Exam: Filed Vitals:   01/20/16 1026  BP: 180/100  Pulse: 98  Temp: 97.9 F (36.6 C)  TempSrc: Oral  Weight: 149 lb (67.586 kg)  SpO2: 97%   Body mass index is 24.06 kg/(m^2). Physical Exam  Constitutional: She appears well-developed and well-nourished. No distress.  Eyes:  glasses  Cardiovascular:  Murmur heard. irreg irreg, 1+ edema of ankles  Pulmonary/Chest: Effort normal and breath sounds normal. She has no rales.  Abdominal: Soft. Bowel sounds are normal.  Musculoskeletal:  Walking with caregiver  Neurological: She is alert.  Skin: Skin is warm and dry.  Psychiatric:  Pleasant with short term memory loss    Labs reviewed: Basic Metabolic Panel:  Recent Labs  04/05/15 0819  06/13/15 1524  10/12/15 0928  01/06/16 0626 01/07/16 0521 01/08/16 0508 01/16/16 1027  NA 144  < >  --   < > 139  < > 137 137 138 145*  K 3.3*  < >  --   < > 4.0  < > 3.7 3.2* 3.3* 4.4  CL 110  < >  --   < > 100  < > 105 100* 99* 105  CO2 26  < >  --   < > 23  < > 25 26 29 26   GLUCOSE 139*  < >  --   < > 120*  < > 141* 140* 144* 117*  BUN 10  < >  --   < > 12  < > 13 11 18 17   CREATININE 1.10*  < >  --   < > 1.13*  < > 0.93 0.93 1.23* 1.19*  CALCIUM 8.9  < >  --   < > 9.1  < > 8.6* 8.8* 9.1 9.0  MG  --   --   --   --   --   --  1.3* 1.5* 1.5*  --   TSH 2.053  --  2.210  --  3.760  --   --   --   --   --   < > = values in this interval not displayed. Liver Function Tests:  Recent Labs  04/13/15 0546 01/05/16 1426 01/06/16 0626  AST 13* 73* 37  ALT 8* 38 33  ALKPHOS 71 134* 124  BILITOT 0.8 0.7 0.8  PROT 6.4* 6.8 6.8  ALBUMIN 2.5* 3.2* 3.2*    Recent Labs  01/07/16 0921  LIPASE 51   No results for input(s): AMMONIA in the last 8760 hours. CBC:  Recent Labs  06/29/15 0938  10/12/15 0928 01/05/16 1426 01/06/16 0626 01/08/16 0508  WBC 8.8  < > 7.2 7.1 7.1 8.6  NEUTROABS 5.8  --  4.7  --  5.1  --   HGB   --   < >  --  10.2* 10.1* 11.4*  HCT 25.4*  < > 33.4* 30.9* 30.0* 34.2*  MCV 79  < > 65* 61.3* 59.5* 59.7*  PLT 441*  < > 529* 273 296 352  < > = values in this interval not displayed. Lipid Panel:  Recent Labs  02/17/15 0810 04/05/15 0819 10/12/15 0928  CHOL 176 161 142  HDL 70 43 47  LDLCALC 92 97 79  TRIG 69 104 82  CHOLHDL 2.5 3.7 3.0   Lab Results  Component Value Date   HGBA1C 6.9* 01/16/2016    Procedures since last visit: Dg Chest Port 1 View  01/05/2016  CLINICAL DATA:  Shortness of Breath EXAM: PORTABLE CHEST 1 VIEW COMPARISON:  Chest radiograph July 27, 2015 and chest CT July 28, 2015 FINDINGS: There is generalized interstitial edema. There are small pleural effusions bilaterally. There is generalized cardiomegaly with pulmonary venous hypertension. There is airspace consolidation in the medial right base. There is atherosclerotic calcification in the aorta. No adenopathy evident. There is degenerative change in the thoracic spine and in each shoulder. IMPRESSION: Congestive heart failure. There is airspace consolidation in the medial right base which may represent either chronic atelectasis, pleural fluid tracking into this area, or possible superimposed pneumonia. Opacification in this area was present on prior study. Electronically Signed   By: Lowella Grip III M.D.   On: 01/05/2016 14:15    Assessment/Plan 1. Essential hypertension -not controlled today -asymptomatic -unfortunately, several problems led to pt not getting her meds that were prescribed at the hospital (see hpi) -unclear why family did not call with this concern before this regularly scheduled appt, but med orders were placed today and they did not want local Rxs due to cost so all [facility] and pt to continue oral clonidine until patches received  2. Chronic atrial fibrillation (HCC) -irreg irreg today, rapid at first but improved with rest -cont metoprolol for this, off blood  thinners due to hemorrhagic stroke with eliquis  3. Chronic diastolic congestive heart failure (HCC) -currently volume overloaded with weight up 8 lbs -advised to give lasix and potassium today and cont to recheck weight daily and cont these meds until back to 141 -parameters provided for lasix and potassium for future use  4. Chronic constipation -cont miralax, but has not been using an bowels still moving  regularly so make prn and monitor closely due to h/o previous SBO (was postop adhesions from chole)  5. Type 2 diabetes mellitus with diabetic nephropathy, without long-term current use of insulin (HCC) -has been well controlled, now off ace and to be on hydralazine and nitrate instead for bp control -cont same diabetes regimen otherwise and monitor  6. Vascular dementia, with behavioral disturbance -requires 24 hr care with family and caregiver -remains on aricept 74m daily for this  Labs/tests ordered:  No orders of the defined types were placed in this encounter.   Next appt:  03/23/2016   Yovanna Cogan L. Beza Steppe, D.O. [facility] [address] [place] [id number] Phone (Mon-Fri 8am-5pm):  [phone] Call:  [phone]& follow prompts after 5pm & weekends Office Phone:  [phone] Fax:  [phone]

## 2016-01-20 NOTE — Patient Instructions (Signed)
Give lasix and potassium if increased edema and shortness of breath OR increase in weight of 3 lbs in 24 hrs or 5 lbs in one week.

## 2016-01-26 DIAGNOSIS — I69318 Other symptoms and signs involving cognitive functions following cerebral infarction: Secondary | ICD-10-CM | POA: Diagnosis not present

## 2016-01-26 DIAGNOSIS — D649 Anemia, unspecified: Secondary | ICD-10-CM | POA: Diagnosis not present

## 2016-01-26 DIAGNOSIS — I130 Hypertensive heart and chronic kidney disease with heart failure and stage 1 through stage 4 chronic kidney disease, or unspecified chronic kidney disease: Secondary | ICD-10-CM | POA: Diagnosis not present

## 2016-01-26 DIAGNOSIS — N189 Chronic kidney disease, unspecified: Secondary | ICD-10-CM | POA: Diagnosis not present

## 2016-01-26 DIAGNOSIS — R2689 Other abnormalities of gait and mobility: Secondary | ICD-10-CM | POA: Diagnosis not present

## 2016-01-26 DIAGNOSIS — M1991 Primary osteoarthritis, unspecified site: Secondary | ICD-10-CM | POA: Diagnosis not present

## 2016-01-26 DIAGNOSIS — E1122 Type 2 diabetes mellitus with diabetic chronic kidney disease: Secondary | ICD-10-CM | POA: Diagnosis not present

## 2016-01-26 DIAGNOSIS — I5042 Chronic combined systolic (congestive) and diastolic (congestive) heart failure: Secondary | ICD-10-CM | POA: Diagnosis not present

## 2016-01-26 DIAGNOSIS — M109 Gout, unspecified: Secondary | ICD-10-CM | POA: Diagnosis not present

## 2016-01-26 DIAGNOSIS — I4891 Unspecified atrial fibrillation: Secondary | ICD-10-CM | POA: Diagnosis not present

## 2016-01-27 DIAGNOSIS — M109 Gout, unspecified: Secondary | ICD-10-CM | POA: Diagnosis not present

## 2016-01-27 DIAGNOSIS — M1991 Primary osteoarthritis, unspecified site: Secondary | ICD-10-CM | POA: Diagnosis not present

## 2016-01-27 DIAGNOSIS — R2689 Other abnormalities of gait and mobility: Secondary | ICD-10-CM | POA: Diagnosis not present

## 2016-01-27 DIAGNOSIS — N189 Chronic kidney disease, unspecified: Secondary | ICD-10-CM | POA: Diagnosis not present

## 2016-01-27 DIAGNOSIS — I130 Hypertensive heart and chronic kidney disease with heart failure and stage 1 through stage 4 chronic kidney disease, or unspecified chronic kidney disease: Secondary | ICD-10-CM | POA: Diagnosis not present

## 2016-01-27 DIAGNOSIS — I4891 Unspecified atrial fibrillation: Secondary | ICD-10-CM | POA: Diagnosis not present

## 2016-01-27 DIAGNOSIS — E1122 Type 2 diabetes mellitus with diabetic chronic kidney disease: Secondary | ICD-10-CM | POA: Diagnosis not present

## 2016-01-27 DIAGNOSIS — I5042 Chronic combined systolic (congestive) and diastolic (congestive) heart failure: Secondary | ICD-10-CM | POA: Diagnosis not present

## 2016-01-27 DIAGNOSIS — D649 Anemia, unspecified: Secondary | ICD-10-CM | POA: Diagnosis not present

## 2016-01-27 DIAGNOSIS — I69318 Other symptoms and signs involving cognitive functions following cerebral infarction: Secondary | ICD-10-CM | POA: Diagnosis not present

## 2016-02-01 DIAGNOSIS — E1122 Type 2 diabetes mellitus with diabetic chronic kidney disease: Secondary | ICD-10-CM | POA: Diagnosis not present

## 2016-02-01 DIAGNOSIS — I69318 Other symptoms and signs involving cognitive functions following cerebral infarction: Secondary | ICD-10-CM | POA: Diagnosis not present

## 2016-02-01 DIAGNOSIS — R2689 Other abnormalities of gait and mobility: Secondary | ICD-10-CM | POA: Diagnosis not present

## 2016-02-01 DIAGNOSIS — I5042 Chronic combined systolic (congestive) and diastolic (congestive) heart failure: Secondary | ICD-10-CM | POA: Diagnosis not present

## 2016-02-01 DIAGNOSIS — M1991 Primary osteoarthritis, unspecified site: Secondary | ICD-10-CM | POA: Diagnosis not present

## 2016-02-01 DIAGNOSIS — N189 Chronic kidney disease, unspecified: Secondary | ICD-10-CM | POA: Diagnosis not present

## 2016-02-01 DIAGNOSIS — M109 Gout, unspecified: Secondary | ICD-10-CM | POA: Diagnosis not present

## 2016-02-01 DIAGNOSIS — D649 Anemia, unspecified: Secondary | ICD-10-CM | POA: Diagnosis not present

## 2016-02-01 DIAGNOSIS — I130 Hypertensive heart and chronic kidney disease with heart failure and stage 1 through stage 4 chronic kidney disease, or unspecified chronic kidney disease: Secondary | ICD-10-CM | POA: Diagnosis not present

## 2016-02-01 DIAGNOSIS — I4891 Unspecified atrial fibrillation: Secondary | ICD-10-CM | POA: Diagnosis not present

## 2016-02-03 DIAGNOSIS — I130 Hypertensive heart and chronic kidney disease with heart failure and stage 1 through stage 4 chronic kidney disease, or unspecified chronic kidney disease: Secondary | ICD-10-CM | POA: Diagnosis not present

## 2016-02-03 DIAGNOSIS — M1991 Primary osteoarthritis, unspecified site: Secondary | ICD-10-CM | POA: Diagnosis not present

## 2016-02-03 DIAGNOSIS — E1122 Type 2 diabetes mellitus with diabetic chronic kidney disease: Secondary | ICD-10-CM | POA: Diagnosis not present

## 2016-02-03 DIAGNOSIS — I4891 Unspecified atrial fibrillation: Secondary | ICD-10-CM | POA: Diagnosis not present

## 2016-02-03 DIAGNOSIS — N189 Chronic kidney disease, unspecified: Secondary | ICD-10-CM | POA: Diagnosis not present

## 2016-02-03 DIAGNOSIS — I69318 Other symptoms and signs involving cognitive functions following cerebral infarction: Secondary | ICD-10-CM | POA: Diagnosis not present

## 2016-02-03 DIAGNOSIS — I5042 Chronic combined systolic (congestive) and diastolic (congestive) heart failure: Secondary | ICD-10-CM | POA: Diagnosis not present

## 2016-02-03 DIAGNOSIS — D649 Anemia, unspecified: Secondary | ICD-10-CM | POA: Diagnosis not present

## 2016-02-03 DIAGNOSIS — R2689 Other abnormalities of gait and mobility: Secondary | ICD-10-CM | POA: Diagnosis not present

## 2016-02-03 DIAGNOSIS — M109 Gout, unspecified: Secondary | ICD-10-CM | POA: Diagnosis not present

## 2016-02-17 ENCOUNTER — Telehealth: Payer: Self-pay | Admitting: *Deleted

## 2016-02-17 ENCOUNTER — Encounter (HOSPITAL_COMMUNITY): Payer: Self-pay

## 2016-02-17 ENCOUNTER — Emergency Department (HOSPITAL_COMMUNITY): Payer: Medicare Other

## 2016-02-17 ENCOUNTER — Emergency Department (HOSPITAL_COMMUNITY)
Admission: EM | Admit: 2016-02-17 | Discharge: 2016-02-17 | Disposition: A | Discharge status: Home / Self Care | Payer: Medicare Other | Attending: Emergency Medicine | Admitting: Emergency Medicine

## 2016-02-17 DIAGNOSIS — R0602 Shortness of breath: Secondary | ICD-10-CM | POA: Diagnosis not present

## 2016-02-17 DIAGNOSIS — N189 Chronic kidney disease, unspecified: Secondary | ICD-10-CM | POA: Insufficient documentation

## 2016-02-17 DIAGNOSIS — J189 Pneumonia, unspecified organism: Secondary | ICD-10-CM | POA: Diagnosis not present

## 2016-02-17 DIAGNOSIS — J690 Pneumonitis due to inhalation of food and vomit: Secondary | ICD-10-CM

## 2016-02-17 DIAGNOSIS — Z7982 Long term (current) use of aspirin: Secondary | ICD-10-CM | POA: Diagnosis not present

## 2016-02-17 DIAGNOSIS — Z79899 Other long term (current) drug therapy: Secondary | ICD-10-CM | POA: Insufficient documentation

## 2016-02-17 DIAGNOSIS — E039 Hypothyroidism, unspecified: Secondary | ICD-10-CM | POA: Diagnosis not present

## 2016-02-17 DIAGNOSIS — I5043 Acute on chronic combined systolic (congestive) and diastolic (congestive) heart failure: Secondary | ICD-10-CM | POA: Diagnosis not present

## 2016-02-17 DIAGNOSIS — I130 Hypertensive heart and chronic kidney disease with heart failure and stage 1 through stage 4 chronic kidney disease, or unspecified chronic kidney disease: Secondary | ICD-10-CM | POA: Diagnosis not present

## 2016-02-17 DIAGNOSIS — E1122 Type 2 diabetes mellitus with diabetic chronic kidney disease: Secondary | ICD-10-CM | POA: Insufficient documentation

## 2016-02-17 DIAGNOSIS — Z85038 Personal history of other malignant neoplasm of large intestine: Secondary | ICD-10-CM | POA: Diagnosis not present

## 2016-02-17 LAB — CBC WITH DIFFERENTIAL/PLATELET
BASOS ABS: 0 10*3/uL (ref 0.0–0.1)
BASOS PCT: 0 %
EOS ABS: 0.1 10*3/uL (ref 0.0–0.7)
Eosinophils Relative: 1 %
HCT: 30.6 % — ABNORMAL LOW (ref 36.0–46.0)
Hemoglobin: 10.2 g/dL — ABNORMAL LOW (ref 12.0–15.0)
LYMPHS PCT: 21 %
Lymphs Abs: 1.3 10*3/uL (ref 0.7–4.0)
MCH: 21.2 pg — ABNORMAL LOW (ref 26.0–34.0)
MCHC: 33.3 g/dL (ref 30.0–36.0)
MCV: 63.6 fL — AB (ref 78.0–100.0)
Monocytes Absolute: 0.6 10*3/uL (ref 0.1–1.0)
Monocytes Relative: 10 %
NEUTROS PCT: 68 %
Neutro Abs: 4.3 10*3/uL (ref 1.7–7.7)
PLATELETS: 315 10*3/uL (ref 150–400)
RBC: 4.81 MIL/uL (ref 3.87–5.11)
RDW: 22.5 % — ABNORMAL HIGH (ref 11.5–15.5)
WBC: 6.3 10*3/uL (ref 4.0–10.5)

## 2016-02-17 LAB — COMPREHENSIVE METABOLIC PANEL
ALBUMIN: 3.3 g/dL — AB (ref 3.5–5.0)
ALT: 21 U/L (ref 14–54)
AST: 27 U/L (ref 15–41)
Alkaline Phosphatase: 85 U/L (ref 38–126)
Anion gap: 7 (ref 5–15)
BUN: 21 mg/dL — AB (ref 6–20)
CHLORIDE: 105 mmol/L (ref 101–111)
CO2: 26 mmol/L (ref 22–32)
CREATININE: 1.32 mg/dL — AB (ref 0.44–1.00)
Calcium: 8.8 mg/dL — ABNORMAL LOW (ref 8.9–10.3)
GFR calc Af Amer: 42 mL/min — ABNORMAL LOW (ref >60)
GFR calc non Af Amer: 36 mL/min — ABNORMAL LOW (ref >60)
GLUCOSE: 147 mg/dL — AB (ref 65–99)
Potassium: 4.1 mmol/L (ref 3.5–5.1)
SODIUM: 138 mmol/L (ref 135–145)
Total Bilirubin: 0.9 mg/dL (ref 0.3–1.2)
Total Protein: 7 g/dL (ref 6.5–8.1)

## 2016-02-17 LAB — I-STAT TROPONIN, ED
Troponin i, poc: 0 ng/mL (ref 0.00–0.08)
Troponin i, poc: 0 ng/mL (ref 0.00–0.08)

## 2016-02-17 LAB — I-STAT CG4 LACTIC ACID, ED
Lactic Acid, Venous: 1.41 mmol/L (ref 0.5–1.9)
Lactic Acid, Venous: 1.96 mmol/L (ref 0.5–1.9)

## 2016-02-17 LAB — BRAIN NATRIURETIC PEPTIDE: B Natriuretic Peptide: 495.4 pg/mL — ABNORMAL HIGH (ref 0.0–100.0)

## 2016-02-17 LAB — MAGNESIUM: MAGNESIUM: 1.7 mg/dL (ref 1.7–2.4)

## 2016-02-17 MED ORDER — DEXTROSE 5 % IV SOLN: 1.0000 g | Freq: Two times a day (BID) | INTRAVENOUS | Status: DC

## 2016-02-17 MED ORDER — VANCOMYCIN HCL IN DEXTROSE 1-5 GM/200ML-% IV SOLN
1000.0000 mg | Freq: Once | INTRAVENOUS | Status: AC
  Administered 2016-02-17: 1000 mg via INTRAVENOUS
  Filled 2016-02-17: qty 200

## 2016-02-17 MED ORDER — VANCOMYCIN HCL IN DEXTROSE 1-5 GM/200ML-% IV SOLN: 1000.0000 mg | INTRAVENOUS | Status: DC

## 2016-02-17 MED ORDER — LEVOFLOXACIN 500 MG PO TABS: 500.0000 mg | ORAL_TABLET | Freq: Every day | ORAL | 0 refills | Status: AC

## 2016-02-17 MED ORDER — DEXTROSE 5 % IV SOLN
1.0000 g | Freq: Once | INTRAVENOUS | Status: AC
  Administered 2016-02-17: 1 g via INTRAVENOUS
  Filled 2016-02-17: qty 1

## 2016-02-17 MED ORDER — ALBUTEROL SULFATE (2.5 MG/3ML) 0.083% IN NEBU
5.0000 mg | INHALATION_SOLUTION | Freq: Once | RESPIRATORY_TRACT | Status: AC
  Administered 2016-02-17: 5 mg via RESPIRATORY_TRACT
  Filled 2016-02-17: qty 6

## 2016-02-17 NOTE — ED Notes (Signed)
RT called for breathing treatment.

## 2016-02-17 NOTE — ED Provider Notes (Signed)
[facility] DEPT Provider Note   CSN: [id number] Arrival date & time: 02/17/16  1013  First Provider Contact:  First MD Initiated Contact with Patient 02/17/16 1043        History   Chief Complaint Chief Complaint  Patient presents with  . Shortness of Breath    HPI Nicole [name] is a 82 y.o. female.  HPI   Shortness of breath for 2 days. Worse with talking, not worse laying down, is worse with exertion. Constant. Cough, nonproductive. No fever. Pt notes no leg swelling now.  Hx of CHF, no hx of asthma or COPD Been admitted to hospital in last 3 mos Not on anticoagulation due to prior nose bleeds Weights the same, taking 48m lasix    Past Medical History:  Diagnosis Date  . AAA (abdominal aortic aneurysm) ([place]   . Acute upper respiratory infections of unspecified site   . Anemia   . Anxiety   . Aortic aneurysm of unspecified site without mention of rupture   . Arthritis   . Atrial fibrillation (HH[place]   . Atrial fibrillation (HWrights[place]   . Cervicalgia   . CHF (congestive heart failure) (H[place]   . Cholelithiasis   . Chronic systolic heart failure (H[place]   . CKD (chronic kidney disease)   . Colon cancer (HR[place] 07/1997  . Congestive heart failure, unspecified   . Depression   . Diabetes mellitus   . Diabetes mellitus (HS[place]   . Dizziness and giddiness   . Electrolyte and fluid disorders not elsewhere classified   . GERD (gastroesophageal reflux disease)   . Gout, unspecified   . Hemorrhage of rectum and anus   . Hypercholesteremia   . Hypertension   . Hypoglycemia, unspecified   . Hypopotassemia   . Hypothyroidism   . Kidney stone    renal calculi  . Malignant neoplasm of colon, unspecified site   . Other and unspecified hyperlipidemia   . Other chronic allergic conjunctivitis   . Other specified cardiac dysrhythmias(427.89)   . Pain in joint, lower leg   . Pain in joint, site unspecified   . Palpitations   . Perforation of bile duct   . Proteinuria     . Shortness of breath   . Small bowel obstruction due to adhesions (HI[place] 05/16/2012  . Stroke (H[place]   . Swelling, mass, or lump in head and neck   . Thoracic aneurysm without mention of rupture     Patient Active Problem List   Diagnosis Date Noted  . Abdominal pain, epigastric 01/07/2016  . Accelerated hypertension with diastolic congestive heart failure, NYHA class 3 (H[place] 01/05/2016  . Atrial fibrillation (HB[place] 01/05/2016  . Rib pain on right side 10/14/2015  . Hypothyroidism due to acquired atrophy of thyroid 10/14/2015  . Type 2 diabetes mellitus with diabetic nephropathy, without long-term current use of insulin (HLeflore 10/14/2015  . Essential hypertension 10/14/2015  . Acute on chronic systolic heart failure (HB[place]air Shore 08/02/2015  . Acute on chronic diastolic heart failure (HWenatchee 08/02/2015  . Bradycardia 07/27/2015  . Chronic anemia 07/27/2015  . Chest pain   . Vascular dementia 06/13/2015  . Memory loss 06/13/2015  . Left hemiparesis (HBismarck   . Chronic diastolic congestive heart failure (HNick[place]   . Persistent atrial fibrillation (HReids[place]   . Type 2 diabetes mellitus with peripheral neuropathy (HCC)   . ICH (intracerebral hemorrhage) (HK[place] Mill 04/04/2015  . Chronic atrial fibrillation (HValley Head   . Hypertensive urgency   . SOB (shortness of breath) 04/03/2014  .  Epistaxis 02/15/2014  . SBO (small bowel obstruction) ([place]) 01/23/2014  . Diabetic nephropathy ([place]) 09/07/2013  . Chronic constipation 03/25/2013  . Carpal tunnel syndrome 02/25/2013  . Hypothyroidism   . Gout flare 06/01/2012  . Leakage of common bile duct s/p ERCP / stent 05/30/2012  . Ileus post op. still NPO 05/22/2012  . Bile leak, postoperative 05/17/2012  . Hypokalemia 05/17/2012  . Atrial fibrillation with RVR ([place]) 05/16/2012  . Postoperative ileus 05/16/2012  . Small bowel obstruction due to adhesions ([place]) 05/16/2012  . Acute on chronic renal insufficiency ([place]) 05/15/2012  . Normal coronary arteries, 2008  05/15/2012  . Type II or unspecified type diabetes mellitus with neurological manifestations, not stated as uncontrolled 05/15/2012  . HTN (hypertension) 05/15/2012  . Respiratory distress post op secondary to rapid AF 05/15/2012  . ARF (acute renal failure) ([place]) 05/15/2012  . Abdominal pain, acute, right upper quadrant 05/12/2012  . Nausea and vomiting 05/12/2012  . Dehydration 05/12/2012  . Cholecystitis chronic, acute, S/P lap cholecystectomy 05/14/12 05/12/2012  . Thoracic aortic aneurysm, 4.8cm 2011   . CHF, past NICM with an EF of 30%, most recent echo 2011 showed EF to be45- 50%   . Anemia 08/09/2011  . History of colon cancer, stage III 08/09/2011    Past Surgical History:  Procedure Laterality Date  . CARDIAC CATHETERIZATION  2008   moderate severe pulm HTN; with elevted pulm capillary wedge pressure   . CHOLECYSTECTOMY  05/14/2012   Procedure: LAPAROSCOPIC CHOLECYSTECTOMY WITH INTRAOPERATIVE CHOLANGIOGRAM;  Surgeon: Haywood Lasso, MD;  Location: [facility];  Service: General;  Laterality: N/A;  . COLON SURGERY     to remove colon ca  . ERCP  05/17/2012   Procedure: ENDOSCOPIC RETROGRADE CHOLANGIOPANCREATOGRAPHY (ERCP);  Surgeon: Missy Sabins, MD;  Location: [facility];  Service: Gastroenterology;  Laterality: N/A;  . ERCP  08/12/2012   Procedure: ENDOSCOPIC RETROGRADE CHOLANGIOPANCREATOGRAPHY (ERCP);  Surgeon: Missy Sabins, MD;  Location[facility] ENDOSCOPY;  Service: Endoscopy;  Laterality: N/A;  . HEMICOLECTOMY  08/11/1997  . KIDNEY STONE SURGERY    . TRANSTHORACIC ECHOCARDIOGRAM  11/2009   EF 45-50%, mild-mod AV regurg; mild MR; mod TR; ascending aorta mildly dilated    OB History    No data available       Home Medications    Prior to Admission medications   Medication Sig Start Date End Date Taking? Authorizing Provider  allopurinol (ZYLOPRIM) 100 MG tablet Take 1 tablet (100 mg total) by mouth daily. 01/05/16  Yes [name] [name], DO  aspirin EC 81 MG tablet Take 81 mg  by mouth daily.   Yes Historical Provider, MD  blood glucose meter kit and supplies Dispense based on patient and insurance preference. Use up to four times daily as directed. (FOR ICD-9 250.00, 250.01). 07/30/15  Yes Lavina Hamman, MD  butalbital-acetaminophen-caffeine (FIORICET, ESGIC) [phone] MG tablet Take 1 tablet by mouth every 6 (six) hours as needed for headache. 01/08/16  Yes Lavina Hamman, MD  cloNIDine (CATAPRES) 0.2 MG tablet Take 0.2 mg by mouth 2 (two) times daily.   Yes Historical Provider, MD  donepezil (ARICEPT) 10 MG tablet Take 1 tablet (10 mg total) by mouth at bedtime. 01/09/16  Yes [name] [name], DO  feeding supplement, GLUCERNA SHAKE, (GLUCERNA SHAKE) LIQD Take 237 mLs by mouth 2 (two) times daily as needed (for poor appetite).   Yes Historical Provider, MD  furosemide (LASIX) 20 MG tablet Take 1 tablet (20 mg total) by mouth  daily. take 20 mg tab extra for more than 2 Lb weight gain in a day or pedal edema along with potassium Patient taking differently: Take 20 mg by mouth 2 (two) times daily as needed. Take 62m by mouth once Daily...then if needed take an extra 20 mg tab for more than 2 Lb weight gain in a day or pedal edema along with potassium 01/08/16  Yes PLavina Hamman MD  glucose blood (ACCU-CHEK AVIVA) test strip Use to check blood sugar daily. Dx: E11.21 10/18/15  Yes [name] [name], DO  hydrALAZINE (APRESOLINE) 50 MG tablet Take 1 tablet (50 mg total) by mouth every 8 (eight) hours. 01/20/16  Yes [name] [name], DO  isosorbide mononitrate (IMDUR) 30 MG 24 hr tablet Take 1 tablet (30 mg total) by mouth daily. 01/20/16  Yes [name] [name], DO  Lancets (ACCU-CHEK SOFT TOUCH) lancets Use as instructed 10/18/15  Yes [name] [name], DO  levothyroxine (SYNTHROID, LEVOTHROID) 50 MCG tablet Take one tablet by mouth 30 minutes before breakfast away from other medications for thyroid 01/05/16  Yes [name] [name], DO  Melatonin 5 MG CAPS Take 1 capsule (5 mg total) by mouth at  bedtime as needed (insomnia). 01/20/16  Yes [name] [name], DO  metoprolol (LOPRESSOR) 100 MG tablet Take 1 tablet (100 mg total) by mouth 2 (two) times daily. 01/20/16  Yes [name] [name], DO  pantoprazole sodium (PROTONIX) 40 mg/20 mL PACK Take 40 mg by mouth daily as needed (acid reflux/ heart burn).    Yes Historical Provider, MD  polyethylene glycol (MIRALAX / GLYCOLAX) packet Take 17 g by mouth daily as needed for moderate constipation. 01/20/16  Yes [name] [name], DO  potassium chloride (K-DUR) 10 MEQ tablet Take 2 tablets (20 mEq total) by mouth daily as needed (take with lasix only.). Patient taking differently: Take 10 mEq by mouth daily.  11/15/15  Yes [name] [name], DO  spironolactone (ALDACTONE) 25 MG tablet Take 1 tablet (25 mg total) by mouth daily. 01/20/16  Yes [name] [name], DO  cloNIDine (CATAPRES - DOSED IN MG/24 HR) 0.2 mg/24hr patch Place 1 patch (0.2 mg total) onto the skin once a week. Patient not taking: Reported on 02/17/2016 01/20/16   [name] [name], DO  levofloxacin (LEVAQUIN) 500 MG tablet Take 1 tablet (500 mg total) by mouth daily. 02/17/16 02/24/16  EGareth Morgan MD    Family History Family History  Problem Relation Age of Onset  . Heart disease Mother   . Stroke Father   . Hypertension Daughter   . Hypertension Son   . Diabetes Sister   . Hypertension Son     Social History Social History  Substance Use Topics  . Smoking status: Never Smoker  . Smokeless tobacco: Never Used  . Alcohol use No     Allergies   Iohexol; Z-pak [azithromycin]; and Iodinated diagnostic agents   Review of Systems Review of Systems  Constitutional: Positive for fatigue. Negative for fever.  HENT: Negative for sore throat.   Eyes: Negative for visual disturbance.  Respiratory: Positive for cough and shortness of breath.   Cardiovascular: Negative for chest pain and leg swelling.  Gastrointestinal: Negative for abdominal pain, nausea and vomiting.  Genitourinary:  Negative for difficulty urinating.  Musculoskeletal: Negative for back pain and neck pain.  Skin: Negative for rash.  Neurological: Negative for syncope and headaches.     Physical Exam Updated Vital Signs BP 160/93 (BP Location: Right Arm)   Pulse  73   Temp 98.6 F (37 C) (Oral)   Resp 22   Ht _0  (1.727 m)   Wt 161 lb (73 kg)   SpO2 97%   BMI 24.48 kg/m   Physical Exam  Constitutional: She is oriented to person, place, and time. She appears well-developed and well-nourished. No distress.  HENT:  Head: Normocephalic and atraumatic.  Eyes: Conjunctivae and EOM are normal.  Neck: Normal range of motion. JVD (borderline) present.  Cardiovascular: Normal rate, regular rhythm, normal heart sounds and intact distal pulses.  Exam reveals no gallop and no friction rub.   No murmur heard. Pulmonary/Chest: Effort normal and breath sounds normal. No respiratory distress. She has no wheezes. She has no rales.  Abdominal: Soft. She exhibits no distension. There is no tenderness. There is no guarding.  Musculoskeletal: She exhibits edema (trace). She exhibits no tenderness.  Neurological: She is alert and oriented to person, place, and time.  Skin: Skin is warm and dry. No rash noted. She is not diaphoretic. No erythema.  Nursing note and vitals reviewed.    ED Treatments / Results  Labs (all labs ordered are listed, but only abnormal results are displayed) Labs Reviewed  CBC WITH DIFFERENTIAL/PLATELET - Abnormal; Notable for the following:       Result Value   Hemoglobin 10.2 (*)    HCT 30.6 (*)    MCV 63.6 (*)    MCH 21.2 (*)    RDW 22.5 (*)    All other components within normal limits  COMPREHENSIVE METABOLIC PANEL - Abnormal; Notable for the following:    Glucose, Bld 147 (*)    BUN 21 (*)    Creatinine, Ser 1.32 (*)    Calcium 8.8 (*)    Albumin 3.3 (*)    GFR calc non Af Amer 36 (*)    GFR calc Af Amer 42 (*)    All other components within normal limits  BRAIN  NATRIURETIC PEPTIDE - Abnormal; Notable for the following:    B Natriuretic Peptide 495.4 (*)    All other components within normal limits  I-STAT CG4 LACTIC ACID, ED - Abnormal; Notable for the following:    Lactic Acid, Venous 1.96 (*)    All other components within normal limits  CULTURE, BLOOD (ROUTINE X 2)  CULTURE, BLOOD (ROUTINE X 2)  MAGNESIUM  I-STAT CG4 LACTIC ACID, ED  I-STAT TROPOININ, ED  Randolm Idol, ED  Randolm Idol, ED    EKG  EKG Interpretation  Date/Time:  Friday February 17 2016 11:07:08 EDT Ventricular Rate:  66 PR Interval:    QRS Duration: 97 QT Interval:  479 QTC Calculation: 502 R Axis:   64 Text Interpretation:  Atrial fibrillation RSR' in V1 or V2, right VCD or RVH LVH with secondary repolarization abnormality Prolonged QT interval No significant change since last tracing Confirmed by [facility] MD, [place] ([id number]) on 02/17/2016 11:18:55 AM       Radiology Dg Chest 2 View  Result Date: 02/17/2016 CLINICAL DATA:  Shortness of breath. EXAM: CHEST  2 VIEW COMPARISON:  Radiograph of January 05, 2016. FINDINGS: Stable cardiomegaly. No pneumothorax is noted. Atherosclerosis of thoracic aorta is noted. Left lung is clear. Right basilar opacity is noted concerning for atelectasis or infiltrate. Bony thorax is unremarkable. IMPRESSION: Aortic atherosclerosis. Mild right basilar opacity is noted concerning for atelectasis or infiltrate. Electronically Signed   By: Marijo Conception, M.D.   On: 02/17/2016 11:01   Procedures Procedures (including critical care time)  Medications Ordered in ED Medications  vancomycin (VANCOCIN) IVPB 1000 mg/200 mL premix (not administered)  ceFEPIme (MAXIPIME) 1 g in dextrose 5 % 50 mL IVPB (not administered)  albuterol (PROVENTIL) (2.5 MG/3ML) 0.083% nebulizer solution 5 mg (5 mg Nebulization Given 02/17/16 1119)  vancomycin (VANCOCIN) IVPB 1000 mg/200 mL premix (0 mg Intravenous Stopped 02/17/16 1500)  ceFEPIme (MAXIPIME) 1 g in  dextrose 5 % 50 mL IVPB (0 g Intravenous Stopped 02/17/16 1345)     Initial Impression / Assessment and Plan / ED Course  I have reviewed the triage vital signs and the nursing notes.  Pertinent labs & imaging results that were available during my care of the patient were reviewed by me and considered in my medical decision making (see chart for details).  Clinical Course   82 year old female with a history of atrial fibrillation, congestive heart failure, CKD, diabetes, hypertension, hypercholesterolemia, CVA, SBO presents with concern of shortness of breath.  Differential diagnosis for dyspnea includes ACS, PE, COPD exacerbation, CHF exacerbation, anemia, pneumonia.  Chest x-ray was done which showed right-sided atelectasis versus infiltrate. EKG was evaluated by me which showed atrial fibrillation with no other acute ST changes.  BNP was 495 decreased from previous values of 1000.  Denies chest pain or acute onset of symptoms, with negative delta troponins and doubt ACS. Patient not hypoxic, no leg pain or swelling, and possible other etiology of shortness of breath is pneumonia, and low suspicion for pulmonary embolus at this time.  Given patient describing some cough in association with her shortness of breath and chest x-ray findings, with admission 1 month ago, treated with vancomycin and cefepime. Patient initially with dyspnea with talking, fatigue, and preference for admission, however on reevaluation, reports she feels significantly better. She is able to ambulate in the emergency department without assistance or dyspnea and feels appropriate for outpatient management. Patient without leukocytosis, initial negative lactate, normal oxygen saturation, and feel appropriate with outpatient management of possible pneumonia with Levaquin. Recommend close PCP follow up, strict return precautions. Patient discharged in stable condition with understanding of reasons to return.   Final Clinical  Impressions(s) / ED Diagnoses   Final diagnoses:  Shortness of breath  Pneumonia, unspecified aspiration pneumonia type    New Prescriptions Discharge Medication List as of 02/17/2016  4:37 PM    START taking these medications   Details  levofloxacin (LEVAQUIN) 500 MG tablet Take 1 tablet (500 mg total) by mouth daily., Starting Fri 02/17/2016, Until Fri 02/24/2016, Print         Gareth Morgan, MD 02/17/16 [phone]

## 2016-02-17 NOTE — Telephone Encounter (Signed)
Patient sister, Drucilla Chalet called and stated that patient was having a hard time breathing. It comes and goes and thinks she needs oxygen.  Instructed her to take her to the ER to be evaluated. She agreed.

## 2016-02-17 NOTE — Progress Notes (Signed)
[facility] consulted for possible home health services.  EDCM spoke to  Patient and family member at bedside.  Patient reports she l;ives at home.  She reports she has private duty nursing services during the day and family takes care of her at night.  She reports she does not need any further dme at this time.  She feel that she is well taken care of.  EDCM informed patient when she was last in the hospital in June [facility] was following her for home health services.  Family member at bedside believes [name] is still patient's home health agency.  EDCM informed patient and family that if patient required further home health services in the future, she may her pcp to order home health services for her.  Patient politely refused any further need for home health services at this time.  No further EDCM needs at this time.

## 2016-02-17 NOTE — ED Triage Notes (Signed)
Per pt/family states increased SOB over the past 2 days

## 2016-02-17 NOTE — ED Notes (Signed)
MD at bedside. 

## 2016-02-17 NOTE — ED Notes (Signed)
Ambulated without assistance.

## 2016-02-17 NOTE — Progress Notes (Signed)
Pharmacy Antibiotic Note  Nicole [name] is a 82 y.o. female admitted on 02/17/2016 with pneumonia.  Pharmacy has been consulted for vancomycin and cefepime dosing.  First doses given in the ED  Scr 1.32, CrCl 47mls/min (N), 67mls/minc(CCG)  Plan: Cefepime 1gm IV q12h Vancomycin 1gm IV q24h Follow renal function, cultures, clinical course    Temp (24hrs), Avg:98.6 F (37 C), Min:98.6 F (37 C), Max:98.6 F (37 C)   Recent Labs Lab 02/17/16 1259 02/17/16 1311  WBC 6.3  --   CREATININE 1.32*  --   LATICACIDVEN  --  1.41    CrCl cannot be calculated (Unknown ideal weight.).    Allergies  Allergen Reactions  . Iohexol      Code: VOM, Desc: PT REPORTS VOMITING W/ IVP DYE- ARS 12/26/08, Onset Date: [id number]   . Z-Pak [Azithromycin]   . Iodinated Diagnostic Agents Nausea Only    PT HAS HAD SINGULAR INCIDENT OF NAUSEA  W/ IV CONTYRAST INJECTION, NONE IF INJECTED SLOWLY//A.CALHOUN    Antimicrobials this admission: 7/28 cefepime >> 7/28 vancomycin >>  Dose adjustments this admission:   Microbiology results: 7/28 BCx:    Thank you for allowing pharmacy to be a part of this patient's care.  Dolly Rias RPh 02/17/2016, 2:34 PM Pager [phone]

## 2016-02-17 NOTE — ED Notes (Signed)
Nurse is going to collect the blood when she gets the IV

## 2016-02-21 ENCOUNTER — Telehealth: Payer: Self-pay | Admitting: *Deleted

## 2016-02-21 NOTE — Telephone Encounter (Signed)
Per DR Jaynee Eagles, placed pt on CM, NP schedule at 11am. Dr Leonie Man pt. Pt placed on her schedule in error.

## 2016-02-22 ENCOUNTER — Encounter: Payer: Self-pay | Admitting: Nurse Practitioner

## 2016-02-22 ENCOUNTER — Ambulatory Visit (INDEPENDENT_AMBULATORY_CARE_PROVIDER_SITE_OTHER): Payer: Medicare Other | Admitting: Nurse Practitioner

## 2016-02-22 ENCOUNTER — Ambulatory Visit: Payer: Medicare Other | Admitting: Neurology

## 2016-02-22 VITALS — BP 172/80 | HR 66 | Ht 68.0 in | Wt 148.6 lb

## 2016-02-22 DIAGNOSIS — I4891 Unspecified atrial fibrillation: Secondary | ICD-10-CM

## 2016-02-22 DIAGNOSIS — I10 Essential (primary) hypertension: Secondary | ICD-10-CM

## 2016-02-22 DIAGNOSIS — I613 Nontraumatic intracerebral hemorrhage in brain stem: Secondary | ICD-10-CM | POA: Diagnosis not present

## 2016-02-22 LAB — CULTURE, BLOOD (ROUTINE X 2)
Culture: NO GROWTH
Culture: NO GROWTH

## 2016-02-22 IMAGING — CT CT ABD-PELV W/O CM
2 of 4 series · 15 of 46 positions shown, 17 images · non-contrast
Comparison: CT of the abdomen and pelvis from 01/23/2014

CLINICAL DATA: Severe abdominal pain, nausea and vomiting.

EXAM:
CT ABDOMEN AND PELVIS WITHOUT CONTRAST
TECHNIQUE: Multidetector CT imaging of the abdomen and pelvis was performed
following the standard protocol without IV contrast.

[Series 2: abd/ pelvis 5.0 i30f 1 · axial · 0.73mm/px · z∈[-389,-24]mm · 12 of 84 slices shown, 14 images]
[im 7/84  soft-tissue]
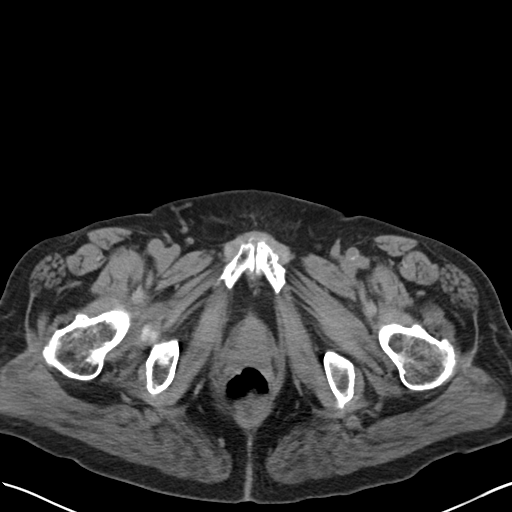
[im 7/84  bone]
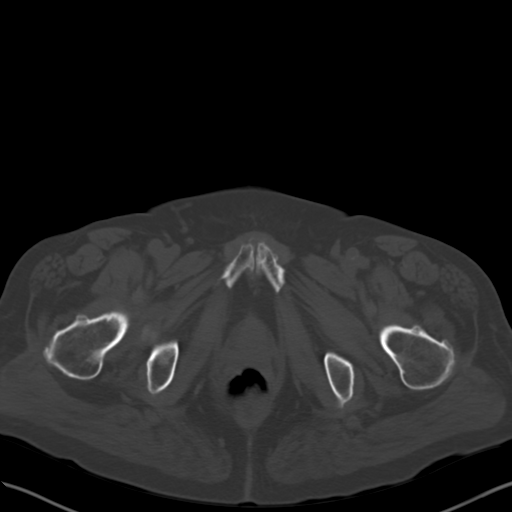
[im 14/84  soft-tissue]
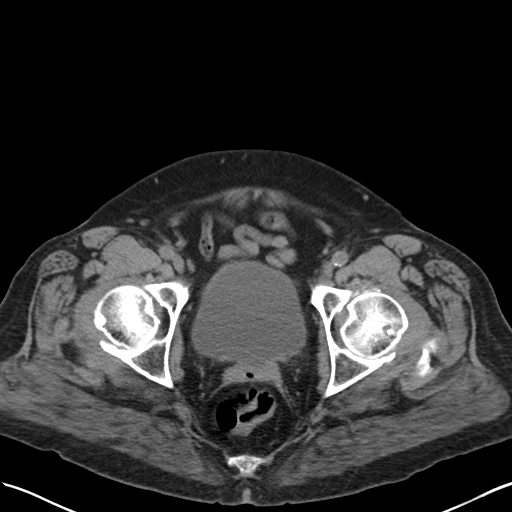
[im 20/84  soft-tissue]
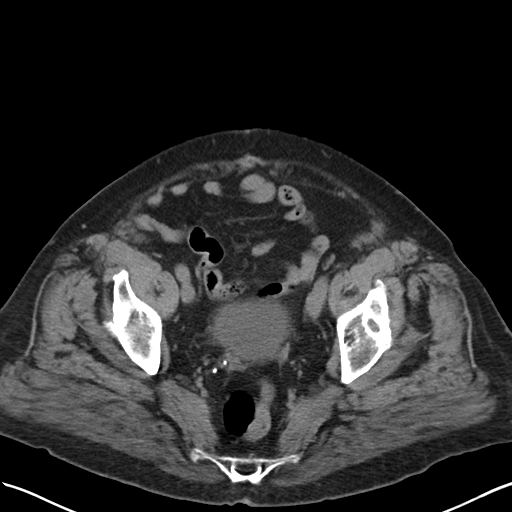
[im 27/84  soft-tissue]
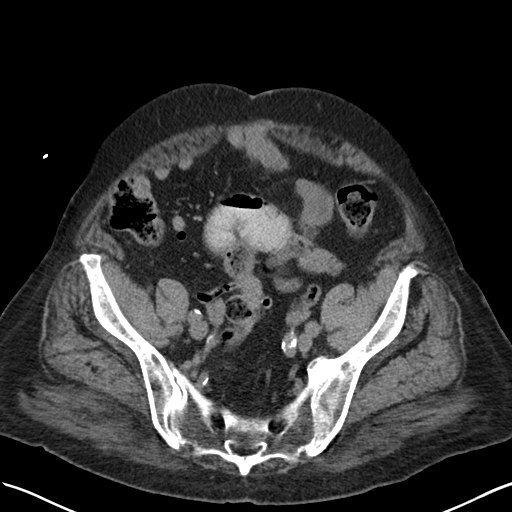
[im 34/84  soft-tissue]
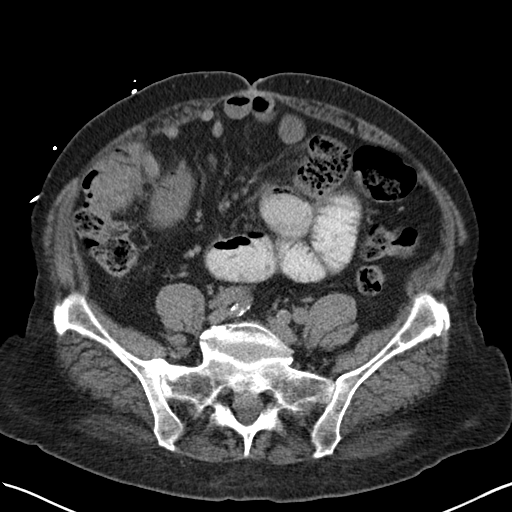
[im 40/84  soft-tissue]
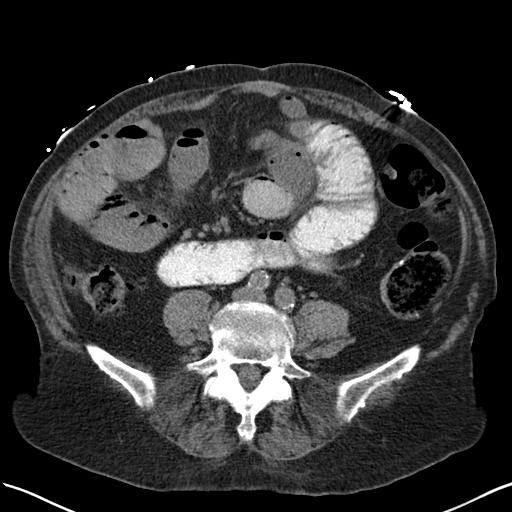
[im 47/84  soft-tissue]
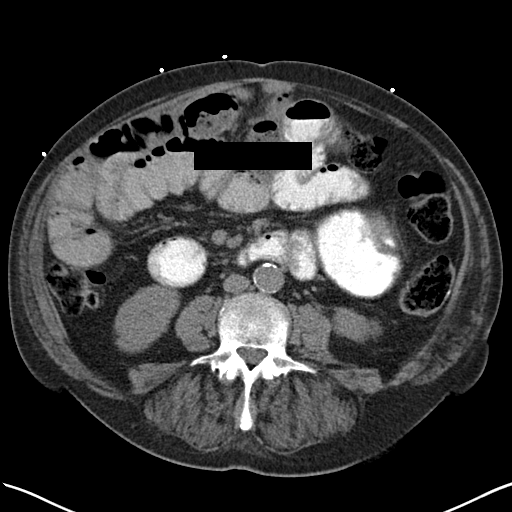
[im 54/84  soft-tissue]
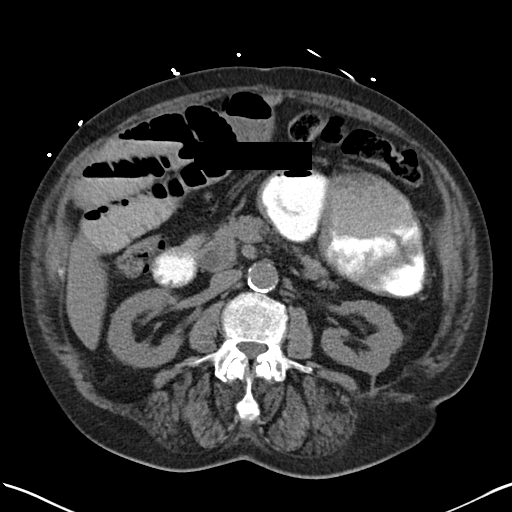
[im 60/84  soft-tissue]
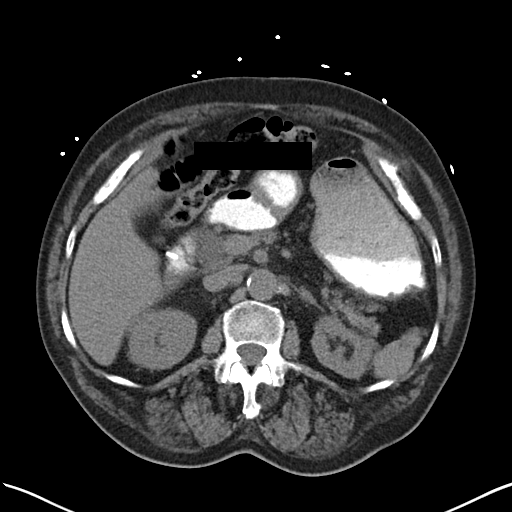
[im 60/84  bone]
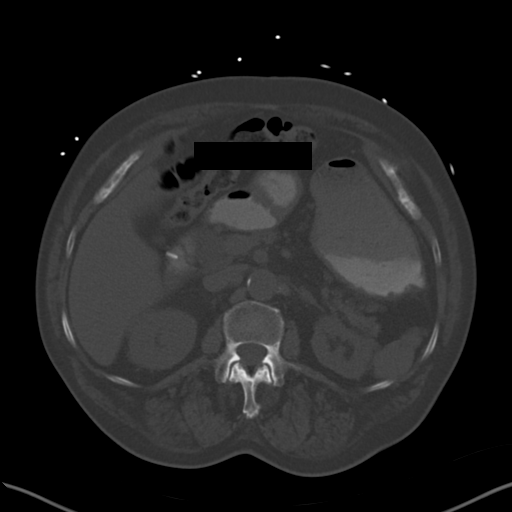
[im 67/84  soft-tissue]
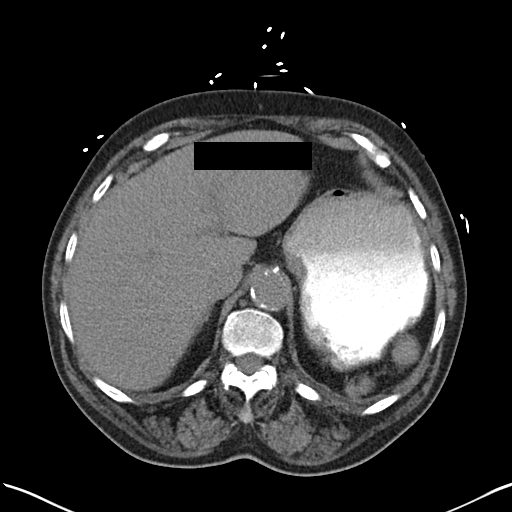
[im 74/84  soft-tissue]
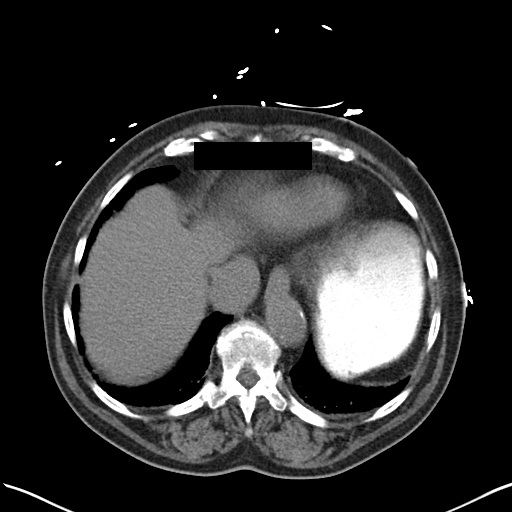
[im 80/84  soft-tissue]
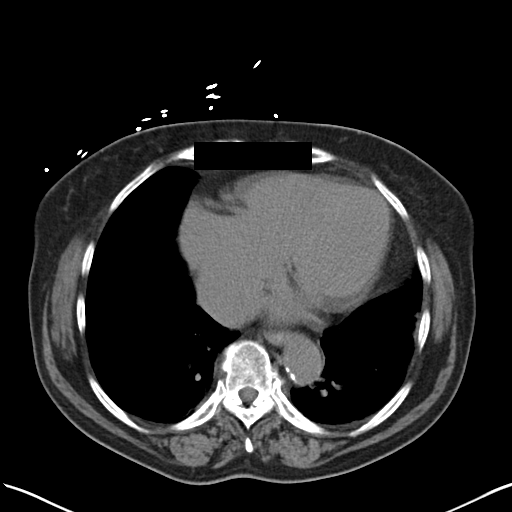

[Series 5: cor st · coronal · 0.74mm/px · 3 of 105 slices shown]
[im 35/105  soft-tissue]
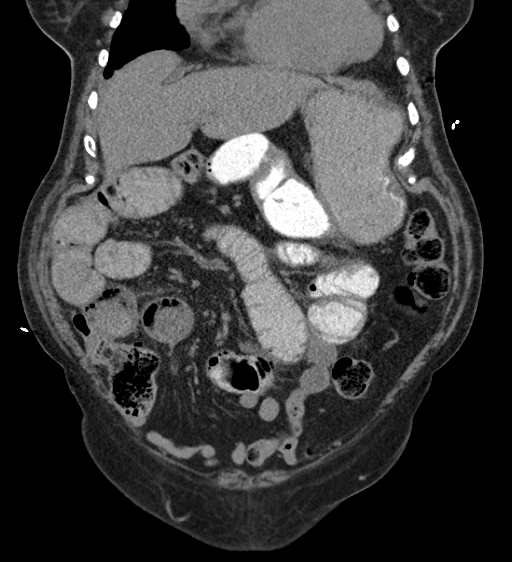
[im 47/105  soft-tissue]
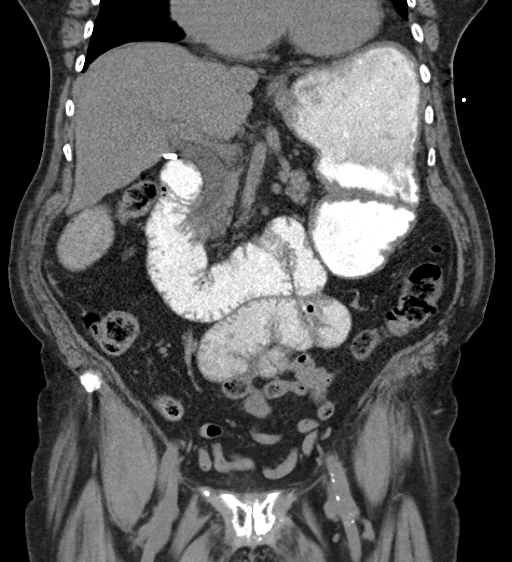
[im 58/105  soft-tissue]
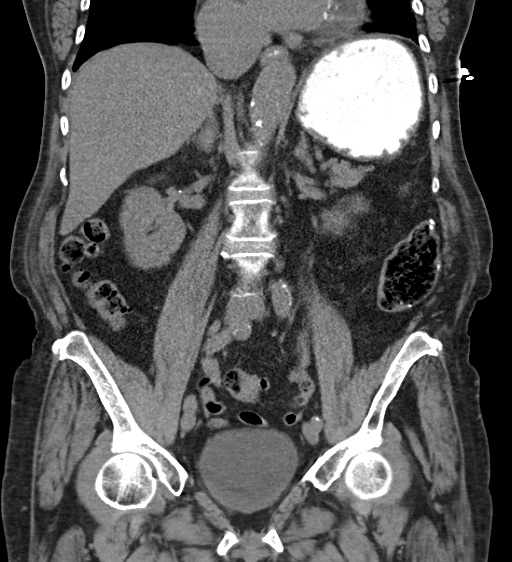

[15 of 46 positions shown; findings below may reference images not displayed]

FINDINGS: Minimal bibasilar atelectasis is noted.

The liver is unremarkable in appearance. Splenosis is noted at the
left upper quadrant. The patient is status post cholecystectomy,
with clips noted along the gallbladder fossa. The pancreas and
adrenal glands are unremarkable.

Mild bilateral renal atrophy and left renal scarring are noted. The
kidneys are otherwise unremarkable. Mild nonspecific perinephric
stranding is noted bilaterally. There is no evidence of
hydronephrosis. No renal or ureteral stones are seen.

There is distention of small-bowel loops to 4.1 cm in maximal
diameter, with gradual dilution of contrast to the level of an
apparent transition point at the mid ileum at the left lower
quadrant. There is fecalization of the ileum just proximal to the
transition point. This raises question for partial small bowel
obstruction, possibly due to an adhesion.

More distal small bowel loops are mostly decompressed, though trace
air is still seen within distal small bowel loops. No free fluid is
identified. The stomach is within normal limits. No acute vascular
abnormalities are seen. Scattered calcification is seen along the
abdominal aorta and its branches.

The appendix is normal in caliber and contains air, without evidence
for appendicitis. Scattered diverticulosis is noted along the
ascending and proximal transverse colon. A bowel suture line is
noted along the descending colon. Minimal diverticulosis is seen
along the sigmoid colon. The colon is otherwise unremarkable.

The bladder is mildly distended and grossly unremarkable. The
patient is status post hysterectomy. No suspicious adnexal masses
are seen. The ovaries are relatively symmetric. No inguinal
lymphadenopathy is seen.

No acute osseous abnormalities are identified.
IMPRESSION: 1. Distention of small-bowel loops to 4.1 cm in maximal diameter,
with gradual dilution of contrast and an apparent transition point
at the mid ileum at the left lower quadrant. There is fecalization
of the ileum just proximal to the transition point, raising concern
for partial small-bowel obstruction, possibly due to an adhesion.
More distal small bowel loops are mostly decompressed, though trace
air is still seen within distal small bowel loops.
2. Scattered calcification along the abdominal aorta and its
branches.
3. Scattered diverticulosis along the ascending and proximal
transverse colon. Minimal diverticulosis along the sigmoid colon.
4. Splenosis noted at the left upper quadrant.
5. Mild bilateral renal atrophy and left renal scarring noted.

## 2016-02-22 NOTE — Progress Notes (Signed)
[facility]  PATIENT: Nicole [name] DOB: [birth year]   REASON FOR VISIT: Follow-up for intracerebral hemorrhage September 2016 HISTORY FROM: Patient and niece    HISTORY OF PRESENT ILLNESS: 06/13/15 PSMs. Nicole [name] is a 82 year old pleasant African-American lady is seen today for first office follow-up visit following hospital admission for intracerebral hemorrhage in September 2016. Nicole [name] is an 82 y.o. female history of atrial fibrillation on Coumadin, hypertension, hyperlipidemia, diabetes mellitus, CHF and chronic kidney disease, brought to the emergency room after being found on the floor home this morning. She was noted to be somewhat confused last night. She complained of feeling of wooziness and slight headache at 3:30 PM yesterday. She has no previous history of stroke nor TIA. CT scan of her head showed fairly large intracerebral hemorrhage involving the right caudate and extending into right ventricle with mass effect with mild midline shift as well as right ventricular enlargement. She has been evaluated by Dr. Saintclair Halsted of the Neurosurgery Service. Ventricular drain is not felt to be warranted at this time. Cerebral hematoma not considered operable. Patient is being admitted to neuro intensive care unit for further management, including immediate coagulation reversal. INR was 3.6. Patient's blood pressure was elevated at 166/103. She was given labetalol IV for immediate management. LSN: 3:30 PM on 04/03/2015 tPA Given: No: Acute ICH. She was admitted to the intensive care unit where blood pressure was tightly controlled. Warfarin coagulopathy with INR of 3.6 on admission was reversed with Kcentra infusion. Follow-up CT scan 3 showed stable appearance of the hematoma and mild right lateral ventriculomegaly and obstructive hydrocephalus but without significant intraventricular extension. Patient did not require neurosurgical intervention or ventriculostomy  catheter placement. She remains drowsy for a few days but then mental status improved. Transthoracic echo showed normal ejection fraction. LDL cholesterol was elevated at 97 mg percent. Hemoglobin A1c was 5.9. She was seen by physical occupational speech therapy and showed gradual improvement. She was transferred to inpatient rehabilitation where she was there for a few weeks. She had follow-up CT scan of the head done on 07/28/15 which I have seen shows significant resolution of the hemorrhage with expected evolutionary changes. She has been started on aspirin 81 mg. She is currently living at home. She is getting outpatient physical and occupational therapy. She has a knee during the day and a family member Spencer night with her. She is able to ambulate independently without her walker or a cane. She has very minimum diminished fine motor skills on the left but otherwise no weakness. She has seen a cardiologist Dr. Debara Pickett who feels she may be a candidate for the watchman device. UPDATE 08/02/2017CM Nicole [name], 82 year old female returns for follow-up. She was last seen in the office by Dr. Leonie Man to 2/2/ 2017. She has a history of intracranial hemorrhage in September 2016. She is now back at home. She has 24/ 7 caregivers. She is fairly independent. She continues to exercise and is quite active. She bowls  once or twice a week. She is currently on aspirin. Dr. Debara Pickett had a discussion with Dr. Leonie Man on 12/09/2015. They deem she was not a candidate for watchman and she would remain on aspirin only but remains at high risk for stroke. She has chronic atrial fibrillation. She returns for reevaluation. She had hospital admission June 4 for hypertension and diastolic congestive heart failure.   REVIEW OF SYSTEMS: Full 14 system review of systems performed and notable only for those listed, all others are neg:  Constitutional: neg  Cardiovascular: neg Ear/Nose/Throat: neg  Skin: neg Eyes: neg Respiratory:  neg Gastroitestinal: neg  Hematology/Lymphatic: neg  Endocrine: neg Musculoskeletal:neg Allergy/Immunology: neg Neurological: Occasional headache Psychiatric: neg Sleep : neg   ALLERGIES: Allergies  Allergen Reactions  . Iohexol      Code: VOM, Desc: PT REPORTS VOMITING W/ IVP DYE- ARS 12/26/08, Onset Date: [id number]   . Z-Pak [Azithromycin]   . Iodinated Diagnostic Agents Nausea Only    PT HAS HAD SINGULAR INCIDENT OF NAUSEA  W/ IV CONTYRAST INJECTION, NONE IF INJECTED SLOWLY//A.CALHOUN    HOME MEDICATIONS: Outpatient Medications Prior to Visit  Medication Sig Dispense Refill  . allopurinol (ZYLOPRIM) 100 MG tablet Take 1 tablet (100 mg total) by mouth daily. 90 tablet 1  . aspirin EC 81 MG tablet Take 81 mg by mouth daily.    . blood glucose meter kit and supplies Dispense based on patient and insurance preference. Use up to four times daily as directed. (FOR ICD-9 250.00, 250.01). 1 each 0  . butalbital-acetaminophen-caffeine (FIORICET, ESGIC) 50-325-40 MG tablet Take 1 tablet by mouth every 6 (six) hours as needed for headache. 14 tablet 0  . cloNIDine (CATAPRES - DOSED IN MG/24 HR) 0.2 mg/24hr patch Place 1 patch (0.2 mg total) onto the skin once a week. 12 patch 3  . cloNIDine (CATAPRES) 0.2 MG tablet Take 0.2 mg by mouth 2 (two) times daily.    Marland Kitchen donepezil (ARICEPT) 10 MG tablet Take 1 tablet (10 mg total) by mouth at bedtime. 90 tablet 3  . feeding supplement, GLUCERNA SHAKE, (GLUCERNA SHAKE) LIQD Take 237 mLs by mouth 2 (two) times daily as needed (for poor appetite).    . furosemide (LASIX) 20 MG tablet Take 1 tablet (20 mg total) by mouth daily. take 20 mg tab extra for more than 2 Lb weight gain in a day or pedal edema along with potassium (Patient taking differently: Take 20 mg by mouth 2 (two) times daily as needed. Take 41m by mouth once Daily...then if needed take an extra 20 mg tab for more than 2 Lb weight gain in a day or pedal edema along with potassium) 30 tablet 0   . glucose blood (ACCU-CHEK AVIVA) test strip Use to check blood sugar daily. Dx: E11.21 100 each 12  . hydrALAZINE (APRESOLINE) 50 MG tablet Take 1 tablet (50 mg total) by mouth every 8 (eight) hours. 270 tablet 3  . isosorbide mononitrate (IMDUR) 30 MG 24 hr tablet Take 1 tablet (30 mg total) by mouth daily. 90 tablet 3  . Lancets (ACCU-CHEK SOFT TOUCH) lancets Use as instructed 100 each 12  . levofloxacin (LEVAQUIN) 500 MG tablet Take 1 tablet (500 mg total) by mouth daily. 7 tablet 0  . levothyroxine (SYNTHROID, LEVOTHROID) 50 MCG tablet Take one tablet by mouth 30 minutes before breakfast away from other medications for thyroid 90 tablet 1  . Melatonin 5 MG CAPS Take 1 capsule (5 mg total) by mouth at bedtime as needed (insomnia). 90 capsule 3  . metoprolol (LOPRESSOR) 100 MG tablet Take 1 tablet (100 mg total) by mouth 2 (two) times daily. 180 tablet 3  . pantoprazole sodium (PROTONIX) 40 mg/20 mL PACK Take 40 mg by mouth daily as needed (acid reflux/ heart burn).     . polyethylene glycol (MIRALAX / GLYCOLAX) packet Take 17 g by mouth daily as needed for moderate constipation. 14 each 0  . potassium chloride (K-DUR) 10 MEQ tablet Take 2 tablets (20 mEq total) by mouth  daily as needed (take with lasix only.). (Patient taking differently: Take 10 mEq by mouth daily. ) 30 tablet 1  . spironolactone (ALDACTONE) 25 MG tablet Take 1 tablet (25 mg total) by mouth daily. 90 tablet 3   No facility-administered medications prior to visit.     PAST MEDICAL HISTORY: Past Medical History:  Diagnosis Date  . AAA (abdominal aortic aneurysm) ([place])   . Acute upper respiratory infections of unspecified site   . Anemia   . Anxiety   . Aortic aneurysm of unspecified site without mention of rupture   . Arthritis   . Atrial fibrillation ([place])   . Atrial fibrillation ([place])   . Cervicalgia   . CHF (congestive heart failure) ([place])   . Cholelithiasis   . Chronic systolic heart failure ([place])   . CKD  (chronic kidney disease)   . Colon cancer ([place]) 07/1997  . Congestive heart failure, unspecified   . Depression   . Diabetes mellitus   . Diabetes mellitus ([place])   . Dizziness and giddiness   . Electrolyte and fluid disorders not elsewhere classified   . GERD (gastroesophageal reflux disease)   . Gout, unspecified   . Hemorrhage of rectum and anus   . Hypercholesteremia   . Hypertension   . Hypoglycemia, unspecified   . Hypopotassemia   . Hypothyroidism   . Kidney stone    renal calculi  . Malignant neoplasm of colon, unspecified site   . Other and unspecified hyperlipidemia   . Other chronic allergic conjunctivitis   . Other specified cardiac dysrhythmias(427.89)   . Pain in joint, lower leg   . Pain in joint, site unspecified   . Palpitations   . Perforation of bile duct   . Proteinuria   . Shortness of breath   . Small bowel obstruction due to adhesions ([place]) 05/16/2012  . Stroke ([place])   . Swelling, mass, or lump in head and neck   . Thoracic aneurysm without mention of rupture     PAST SURGICAL HISTORY: Past Surgical History:  Procedure Laterality Date  . CARDIAC CATHETERIZATION  2008   moderate severe pulm HTN; with elevted pulm capillary wedge pressure   . CHOLECYSTECTOMY  05/14/2012   Procedure: LAPAROSCOPIC CHOLECYSTECTOMY WITH INTRAOPERATIVE CHOLANGIOGRAM;  Surgeon: Haywood Lasso, MD;  Location: [facility];  Service: General;  Laterality: N/A;  . COLON SURGERY     to remove colon ca  . ERCP  05/17/2012   Procedure: ENDOSCOPIC RETROGRADE CHOLANGIOPANCREATOGRAPHY (ERCP);  Surgeon: Missy Sabins, MD;  Location: [facility];  Service: Gastroenterology;  Laterality: N/A;  . ERCP  08/12/2012   Procedure: ENDOSCOPIC RETROGRADE CHOLANGIOPANCREATOGRAPHY (ERCP);  Surgeon: Missy Sabins, MD;  Location: [facility] ENDOSCOPY;  Service: Endoscopy;  Laterality: N/A;  . HEMICOLECTOMY  08/11/1997  . KIDNEY STONE SURGERY    . TRANSTHORACIC ECHOCARDIOGRAM  11/2009   EF 45-50%, mild-mod AV  regurg; mild MR; mod TR; ascending aorta mildly dilated    FAMILY HISTORY: Family History  Problem Relation Age of Onset  . Heart disease Mother   . Stroke Father   . Hypertension Daughter   . Hypertension Son   . Diabetes Sister   . Hypertension Son     SOCIAL HISTORY: Social History   Social History  . Marital status: Married    Spouse name: N/A  . Number of children: N/A  . Years of education: N/A   Occupational History  . Not on file.   Social History Main Topics  .  Smoking status: Never Smoker  . Smokeless tobacco: Never Used  . Alcohol use No  . Drug use: No  . Sexual activity: No   Other Topics Concern  . Not on file   Social History Narrative  . No narrative on file     PHYSICAL EXAM  Vitals:   02/22/16 1044  BP: (!) 172/80  Pulse: 66  Weight: 148 lb 9.6 oz (67.4 kg)  Height: 5' 8"  (1.727 m)   Body mass index is 22.59 kg/m. General: well developed, well nourished elderly African-American lady, seated, in no evident distress Head: head normocephalic and atraumatic.  Neck: supple with no carotid  Cardiovascular:irregularly irregular  rate and rhythm, no murmurs Musculoskeletal: no deformity Skin:  no rash/petichiae Vascular:  Normal pulses all extremities   Neurological examination  Mental Status: Awake and fully alert. Oriented to place and time. Recent and remote memory intact. Attention span, concentration and fund of knowledge appropriate. Mood and affect appropriate.  Cranial Nerves: Fundoscopic exam reveals sharp disc margins. Pupils equal, briskly reactive to light. Extraocular movements full without nystagmus. Visual fields full to confrontation. Hearing intact. Facial sensation intact. Face, tongue, palate moves normally and symmetrically.  Motor: Normal bulk and tone. Normal strength in all tested extremity muscles. Diminished fine finger movements on the left.  Mild weakness of left hip flexors and ankle dorsiflexors. Sensory.: intact  to touch ,pinprick .position and vibratory sensation.  Coordination: Rapid alternating movements normal in all extremities. Finger-to-nose and heel-to-shin performed accurately bilaterally. Gait and Station: Arises from chair without difficulty. Stance is normal. Gait demonstrates normal stride length and balance. Not able to heel, toe and tandem walk without difficulty.  Reflexes: 1+ and symmetric. Toes downgoing.    DIAGNOSTIC DATA (LABS, IMAGING, TESTING) - I reviewed patient records, labs, notes, testing and imaging myself where available.  Lab Results  Component Value Date   WBC 6.3 02/17/2016   HGB 10.2 (L) 02/17/2016   HCT 30.6 (L) 02/17/2016   MCV 63.6 (L) 02/17/2016   PLT 315 02/17/2016      Component Value Date/Time   NA 138 02/17/2016 1259   NA 145 (H) 01/16/2016 1027   K 4.1 02/17/2016 1259   CL 105 02/17/2016 1259   CO2 26 02/17/2016 1259   GLUCOSE 147 (H) 02/17/2016 1259   BUN 21 (H) 02/17/2016 1259   BUN 17 01/16/2016 1027   CREATININE 1.32 (H) 02/17/2016 1259   CALCIUM 8.8 (L) 02/17/2016 1259   PROT 7.0 02/17/2016 1259   PROT 7.0 12/03/2013 0833   ALBUMIN 3.3 (L) 02/17/2016 1259   ALBUMIN 4.3 12/03/2013 0833   AST 27 02/17/2016 1259   ALT 21 02/17/2016 1259   ALKPHOS 85 02/17/2016 1259   BILITOT 0.9 02/17/2016 1259   GFRNONAA 36 (L) 02/17/2016 1259   GFRAA 42 (L) 02/17/2016 1259   Lab Results  Component Value Date   CHOL 142 10/12/2015   HDL 47 10/12/2015   LDLCALC 79 10/12/2015   TRIG 82 10/12/2015   CHOLHDL 3.0 10/12/2015   Lab Results  Component Value Date   HGBA1C 6.9 (H) 01/16/2016    Lab Results  Component Value Date   TSH 3.760 10/12/2015      ASSESSMENT AND PLAN 83 year African-American lady with right basal ganglia hemorrhage in September 2016 due to hypertension and warfarin related coagulopathy who was doing remarkably well. Vascular risk factors of atrial fibrillation, hypertension.Dr. Debara Pickett had a discussion with Dr. Leonie Man on  12/09/2015. They deem she was not  a candidate for watchman and she would remain on aspirin only but remains at high risk for stroke.Records reviewed    PLAN: Continue aspirin for secondary stroke prevention Blood pressure goal of 130/90 or less Hemoglobin A1c 6.5 or less  lipids with LDL cholesterol below 70 Continue being active bowling etc. Healthy diet, whole grains cereals fruits and vegetables Follow-up in 6 months Dennie Bible, [facility], [facility], APRN  [facility] [address], [place], [place] [address] [phone]

## 2016-02-22 NOTE — Patient Instructions (Signed)
Continue aspirin for secondary stroke prevention Blood pressure goal of 130/90 or less Hemoglobin A1c 6.5 or less  lipids with LDL cholesterol below 70 Continue being active bowling etc. Healthy diet, whole grains cereals fruits and vegetables Follow-up in 6 months

## 2016-02-23 IMAGING — CR DG ABDOMEN 1V
2 series · 2 of 2 positions shown · non-contrast
Comparison: 04/03/2014.

CLINICAL DATA: Bowel obstruction.

EXAM:
ABDOMEN - 1 VIEW

[t abdomen supine (1 of 2)]
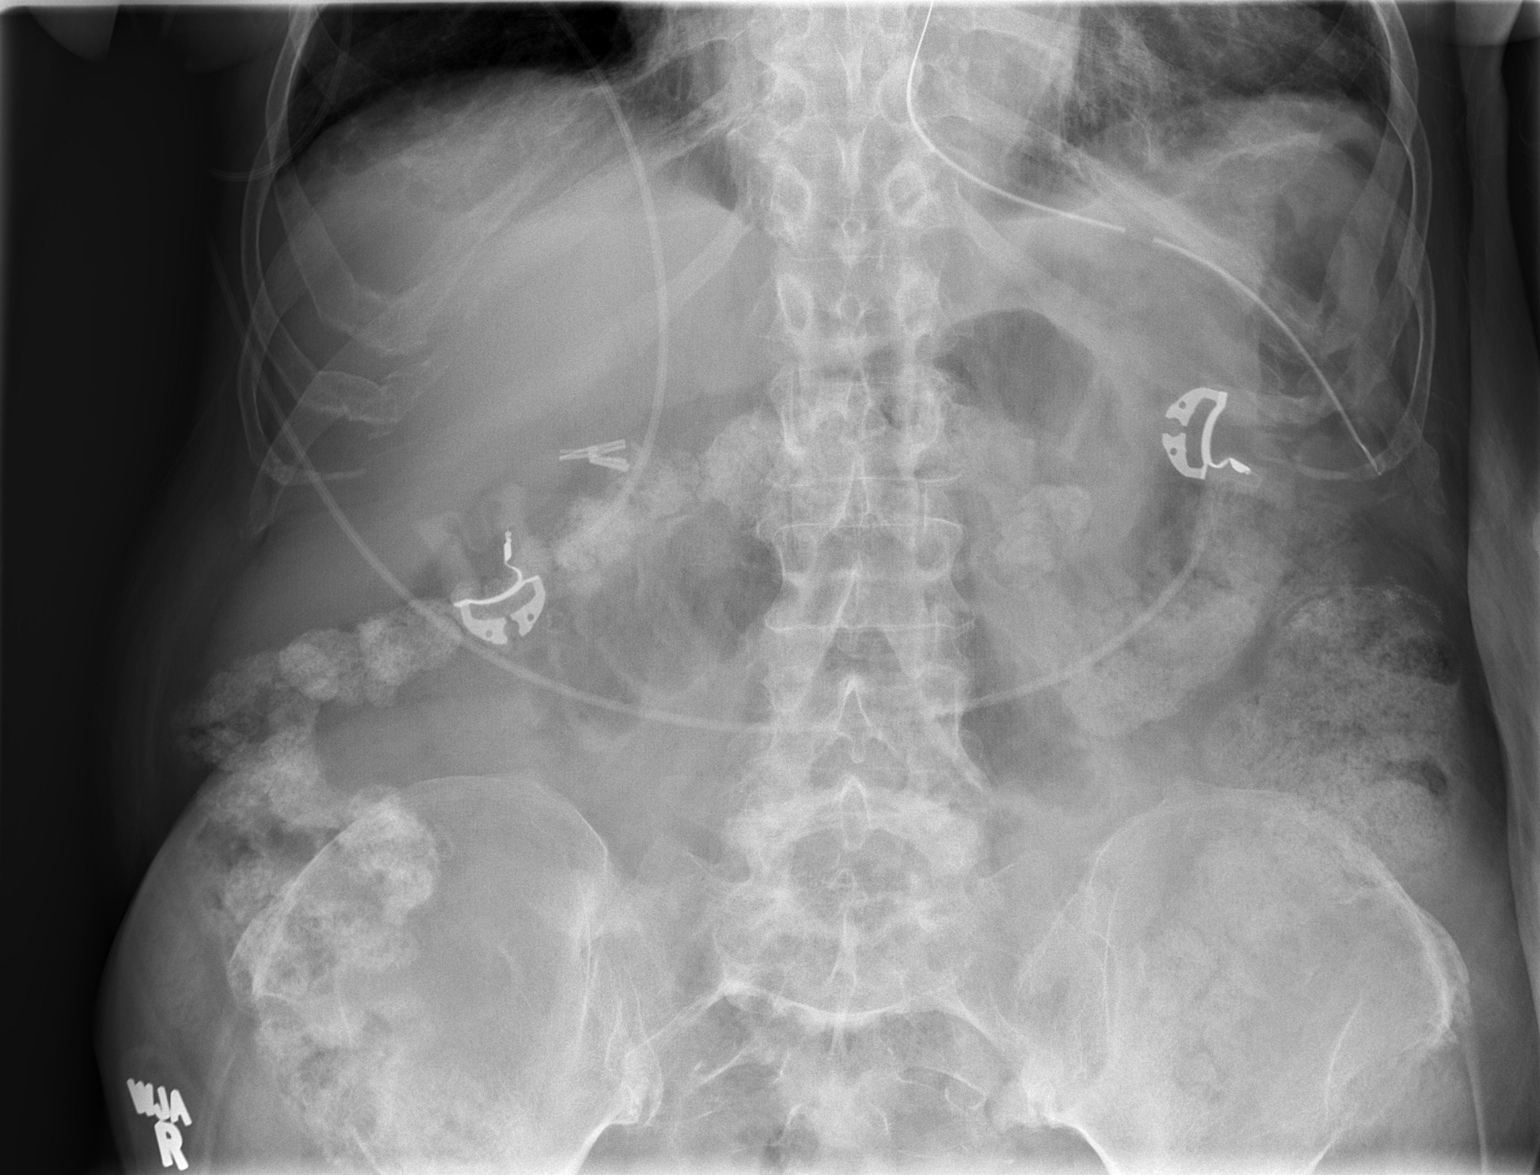

[t abdomen supine (2 of 2)]
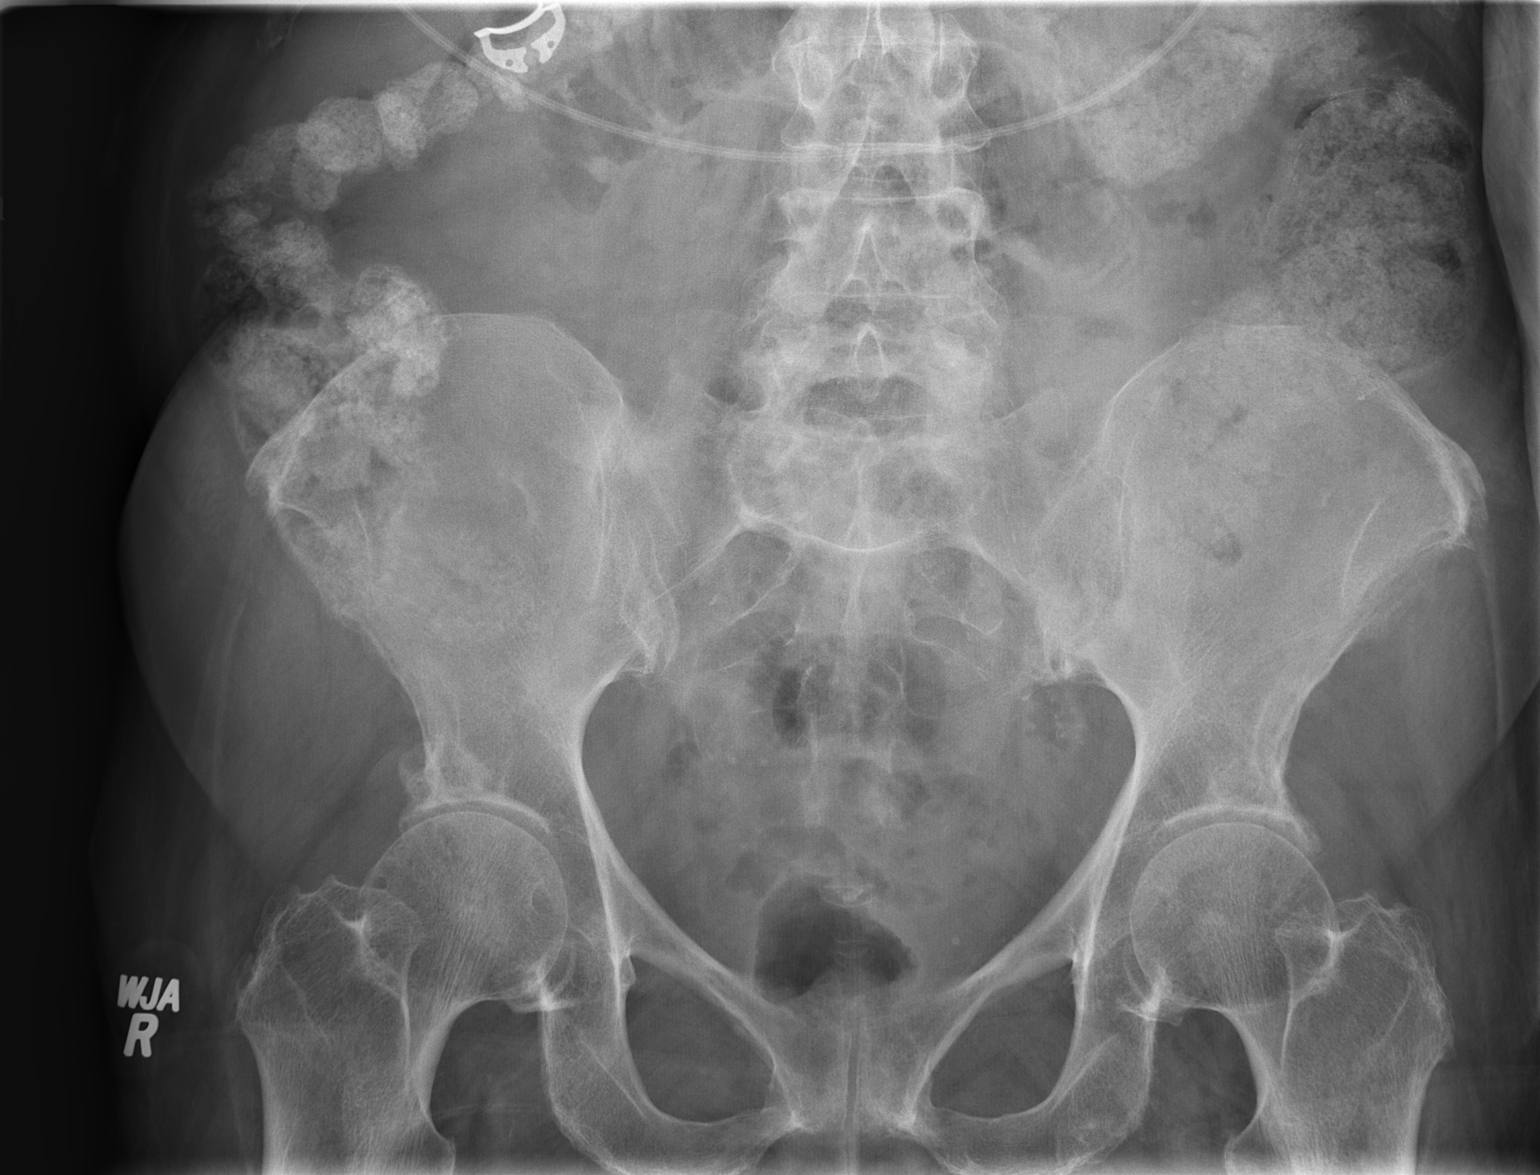

[2 of 2 positions shown; findings below may reference images not displayed]

FINDINGS: Gas, stool and residual oral contrast material are noted throughout
the colon extending to the level of the distal rectum. There are a
few prominent but nondilated loops of gas-filled small bowel
throughout the central abdomen, measuring up to 3.7 cm in diameter,
which are nonspecific. No pathologic dilatation of small bowel. No
gross evidence of pneumoperitoneum. Nasogastric tube is present with
tip terminating in the mid stomach. Surgical clips project over the
right upper quadrant of the abdomen, compatible with prior
cholecystectomy.
IMPRESSION: 1. Nonspecific, nonobstructive bowel gas pattern.
2. No pneumoperitoneum.

## 2016-02-27 ENCOUNTER — Ambulatory Visit (INDEPENDENT_AMBULATORY_CARE_PROVIDER_SITE_OTHER): Payer: Medicare Other | Admitting: Internal Medicine

## 2016-02-27 VITALS — BP 148/70 | HR 81 | Temp 97.8°F | Wt 147.0 lb

## 2016-02-27 DIAGNOSIS — J189 Pneumonia, unspecified organism: Secondary | ICD-10-CM | POA: Diagnosis not present

## 2016-02-27 DIAGNOSIS — I482 Chronic atrial fibrillation, unspecified: Secondary | ICD-10-CM

## 2016-02-27 DIAGNOSIS — I5032 Chronic diastolic (congestive) heart failure: Secondary | ICD-10-CM

## 2016-02-27 DIAGNOSIS — J181 Lobar pneumonia, unspecified organism: Principal | ICD-10-CM

## 2016-02-27 MED ORDER — FUROSEMIDE 20 MG PO TABS: ORAL_TABLET | ORAL | 0 refills | Status: DC

## 2016-02-27 MED ORDER — FUROSEMIDE 20 MG PO TABS: 20.0000 mg | ORAL_TABLET | Freq: Every day | ORAL | 0 refills | Status: DC

## 2016-02-27 NOTE — Progress Notes (Signed)
Location:  [facility] clinic Provider:  Jennavie Martinek L. Mariea Clonts, D.O., C.M.D.  Code Status: DNR Goals of Care:  Advanced Directives 02/17/2016  Does patient have an advance directive? No  Type of Advance Directive -  Does patient want to make changes to advanced directive? -  Copy of advanced directive(s) in chart? -  Would patient like information on creating an advanced directive? No - patient declined information  Pre-existing out of facility DNR order (yellow form or pink MOST form) -     Chief Complaint  Patient presents with  . ER follow-up    ED rollow-up    HPI: Patient is a 82 y.o. female seen today for ED f/u from 7/28 when she went with shortness of breath.  Notes indicate she had pneumonia and was treated with levaquin that was to start that day and thru 02/24/16.  Feels better now.  Says she was just sick.  Her sister had taken her to the ED. She has a very mild residual cough, but not productive.  She's been afebrile.  She did not have leukocytosis, only a right lower lobe infiltrate vs atelectasis.  Her BNP was 495 which is good for her (has been in the 1000s during chf exacerbation).  She tolerated the levaquin without diarrhea or tendon problems, arrythmias.    She feels well now and wants to go home.  Her caregiver and her daughter have no idea why she went to the ED, what happened,etc.    Past Medical History:  Diagnosis Date  . AAA (abdominal aortic aneurysm) ([place])   . Acute upper respiratory infections of unspecified site   . Anemia   . Anxiety   . Aortic aneurysm of unspecified site without mention of rupture   . Arthritis   . Atrial fibrillation ([place])   . Atrial fibrillation ([place])   . Cervicalgia   . CHF (congestive heart failure) ([place])   . Cholelithiasis   . Chronic systolic heart failure ([place])   . CKD (chronic kidney disease)   . Colon cancer ([place]) 07/1997  . Congestive heart failure, unspecified   . Depression   . Diabetes mellitus   . Diabetes mellitus ([place])     . Dizziness and giddiness   . Electrolyte and fluid disorders not elsewhere classified   . GERD (gastroesophageal reflux disease)   . Gout, unspecified   . Hemorrhage of rectum and anus   . Hypercholesteremia   . Hypertension   . Hypoglycemia, unspecified   . Hypopotassemia   . Hypothyroidism   . Kidney stone    renal calculi  . Malignant neoplasm of colon, unspecified site   . Other and unspecified hyperlipidemia   . Other chronic allergic conjunctivitis   . Other specified cardiac dysrhythmias(427.89)   . Pain in joint, lower leg   . Pain in joint, site unspecified   . Palpitations   . Perforation of bile duct   . Proteinuria   . Shortness of breath   . Small bowel obstruction due to adhesions ([place]) 05/16/2012  . Stroke ([place])   . Swelling, mass, or lump in head and neck   . Thoracic aneurysm without mention of rupture     Past Surgical History:  Procedure Laterality Date  . CARDIAC CATHETERIZATION  2008   moderate severe pulm HTN; with elevted pulm capillary wedge pressure   . CHOLECYSTECTOMY  05/14/2012   Procedure: LAPAROSCOPIC CHOLECYSTECTOMY WITH INTRAOPERATIVE CHOLANGIOGRAM;  Surgeon: Haywood Lasso, MD;  Location: [place];  Service: General;  Laterality: N/A;  . COLON SURGERY     to remove colon ca  . ERCP  05/17/2012   Procedure: ENDOSCOPIC RETROGRADE CHOLANGIOPANCREATOGRAPHY (ERCP);  Surgeon: Missy Sabins, MD;  Location: [place];  Service: Gastroenterology;  Laterality: N/A;  . ERCP  08/12/2012   Procedure: ENDOSCOPIC RETROGRADE CHOLANGIOPANCREATOGRAPHY (ERCP);  Surgeon: Missy Sabins, MD;  Location: [facility] ENDOSCOPY;  Service: Endoscopy;  Laterality: N/A;  . HEMICOLECTOMY  08/11/1997  . KIDNEY STONE SURGERY    . TRANSTHORACIC ECHOCARDIOGRAM  11/2009   EF 45-50%, mild-mod AV regurg; mild MR; mod TR; ascending aorta mildly dilated    Allergies  Allergen Reactions  . Iohexol      Code: VOM, Desc: PT REPORTS VOMITING W/ IVP DYE- ARS 12/26/08, Onset Date: [id number]    . Z-Pak [Azithromycin]   . Iodinated Diagnostic Agents Nausea Only    PT HAS HAD SINGULAR INCIDENT OF NAUSEA  W/ IV CONTYRAST INJECTION, NONE IF INJECTED SLOWLY//A.[place]      Medication List       Accurate as of 02/27/16  3:54 PM. Always use your most recent med list.          accu-chek soft touch lancets Use as instructed   allopurinol 100 MG tablet Commonly known as:  ZYLOPRIM Take 1 tablet (100 mg total) by mouth daily.   aspirin EC 81 MG tablet Take 81 mg by mouth daily.   butalbital-acetaminophen-caffeine 50-325-40 MG tablet Commonly known as:  FIORICET, ESGIC Take 1 tablet by mouth every 6 (six) hours as needed for headache.   cloNIDine 0.2 MG tablet Commonly known as:  CATAPRES Take 0.2 mg by mouth 2 (two) times daily.   donepezil 10 MG tablet Commonly known as:  ARICEPT Take 1 tablet (10 mg total) by mouth at bedtime.   feeding supplement (GLUCERNA SHAKE) Liqd Take 237 mLs by mouth 2 (two) times daily as needed (for poor appetite).   furosemide 20 MG tablet Commonly known as:  LASIX Take 1 tablet by mouth daily, Take 1 extra tablet for more than a 3lb weight gain in a day along with potassium.   glucose blood test strip Commonly known as:  ACCU-CHEK AVIVA Use to check blood sugar daily. Dx: E11.21   hydrALAZINE 50 MG tablet Commonly known as:  APRESOLINE Take 1 tablet (50 mg total) by mouth every 8 (eight) hours.   isosorbide mononitrate 30 MG 24 hr tablet Commonly known as:  IMDUR Take 1 tablet (30 mg total) by mouth daily.   levothyroxine 50 MCG tablet Commonly known as:  SYNTHROID, LEVOTHROID Take one tablet by mouth 30 minutes before breakfast away from other medications for thyroid   Melatonin 5 MG Caps Take 1 capsule (5 mg total) by mouth at bedtime as needed (insomnia).   metoprolol 100 MG tablet Commonly known as:  LOPRESSOR Take 1 tablet (100 mg total) by mouth 2 (two) times daily.   polyethylene glycol packet Commonly known as:   MIRALAX / GLYCOLAX Take 17 g by mouth daily as needed for moderate constipation.   potassium chloride 10 MEQ tablet Commonly known as:  K-DUR,KLOR-CON Take 10 mEq by mouth daily. Taking along with lasix   PROTONIX 40 mg/20 mL Pack Generic drug:  pantoprazole sodium Take 40 mg by mouth daily as needed (acid reflux/ heart burn).   spironolactone 25 MG tablet Commonly known as:  ALDACTONE Take 1 tablet (25 mg total) by mouth daily.       Review of Systems:  Review of Systems  Constitutional: Negative for fever and malaise/fatigue.  Eyes: Negative for blurred vision.  Respiratory: Positive for cough. Negative for sputum production, shortness of breath and wheezing.   Cardiovascular: Negative for chest pain, palpitations and leg swelling.  Gastrointestinal: Negative for abdominal pain.  Genitourinary: Negative for dysuria.  Musculoskeletal: Negative for falls.  Skin: Negative for rash.  Neurological: Negative for weakness and headaches.       Prior stroke  Psychiatric/Behavioral: Positive for memory loss.    Health Maintenance  Topic Date Due  . OPHTHALMOLOGY EXAM  03/22/1941  . ZOSTAVAX  03/21/1993  . INFLUENZA VACCINE  02/21/2016  . FOOT EXAM  02/24/2016  . URINE MICROALBUMIN  06/30/2016  . HEMOGLOBIN A1C  07/17/2016  . MAMMOGRAM  09/08/2016  . TETANUS/TDAP  09/16/2021  . DEXA SCAN  Completed  . PNA vac Low Risk Adult  Completed    Physical Exam: Vitals:   02/27/16 1521  BP: (!) 148/70  Pulse: 81  Temp: 97.8 F (36.6 C)  TempSrc: Oral  SpO2: 97%  Weight: 147 lb (66.7 kg)   Body mass index is 22.35 kg/m. Physical Exam  Constitutional: She appears well-developed and well-nourished. No distress.  Cardiovascular: Intact distal pulses.   Murmur heard. irreg irreg  Pulmonary/Chest: Effort normal and breath sounds normal. No respiratory distress. She has no wheezes. She has no rales.  Abdominal: Bowel sounds are normal.  Neurological: She is alert.  Skin:  Skin is warm and dry.    Labs reviewed: Basic Metabolic Panel:  Recent Labs  04/05/15 0819  06/13/15 1524  10/12/15 0928  01/07/16 0521 01/08/16 0508 01/16/16 1027 02/17/16 1259  NA 144  < >  --   < > 139  < > 137 138 145* 138  K 3.3*  < >  --   < > 4.0  < > 3.2* 3.3* 4.4 4.1  CL 110  < >  --   < > 100  < > 100* 99* 105 105  CO2 26  < >  --   < > 23  < > 26 29 26 26   GLUCOSE 139*  < >  --   < > 120*  < > 140* 144* 117* 147*  BUN 10  < >  --   < > 12  < > 11 18 17  21*  CREATININE 1.10*  < >  --   < > 1.13*  < > 0.93 1.23* 1.19* 1.32*  CALCIUM 8.9  < >  --   < > 9.1  < > 8.8* 9.1 9.0 8.8*  MG  --   --   --   --   --   < > 1.5* 1.5*  --  1.7  TSH 2.053  --  2.210  --  3.760  --   --   --   --   --   < > = values in this interval not displayed. Liver Function Tests:  Recent Labs  01/05/16 1426 01/06/16 0626 02/17/16 1259  AST 73* 37 27  ALT 38 33 21  ALKPHOS 134* 124 85  BILITOT 0.7 0.8 0.9  PROT 6.8 6.8 7.0  ALBUMIN 3.2* 3.2* 3.3*    Recent Labs  01/07/16 0921  LIPASE 51   No results for input(s): AMMONIA in the last 8760 hours. CBC:  Recent Labs  10/12/15 0928  01/06/16 0626 01/08/16 0508 02/17/16 1259  WBC 7.2  < > 7.1 8.6 6.3  NEUTROABS 4.7  --  5.1  --  4.3  HGB  --   < > 10.1* 11.4* 10.2*  HCT 33.4*  < > 30.0* 34.2* 30.6*  MCV 65*  < > 59.5* 59.7* 63.6*  PLT 529*  < > 296 352 315  < > = values in this interval not displayed. Lipid Panel:  Recent Labs  04/05/15 0819 10/12/15 0928  CHOL 161 142  HDL 43 47  LDLCALC 97 79  TRIG 104 82  CHOLHDL 3.7 3.0   Lab Results  Component Value Date   HGBA1C 6.9 (H) 01/16/2016    Procedures since last visit: Dg Chest 2 View  Result Date: 02/17/2016 CLINICAL DATA:  Shortness of breath. EXAM: CHEST  2 VIEW COMPARISON:  Radiograph of January 05, 2016. FINDINGS: Stable cardiomegaly. No pneumothorax is noted. Atherosclerosis of thoracic aorta is noted. Left lung is clear. Right basilar opacity is noted  concerning for atelectasis or infiltrate. Bony thorax is unremarkable. IMPRESSION: Aortic atherosclerosis. Mild right basilar opacity is noted concerning for atelectasis or infiltrate. Electronically Signed   By: Marijo Conception, M.D.   On: 02/17/2016 11:01   Assessment/Plan 1. Right lower lobe pneumonia -if she truly had this, it is resolved--lungs are clear, she has a mild cough that's nonproductive, never had a documented fever, leukocytosis or typical signs of pneumonia (possibly had atelectasis or some fluid at her right base or aspirated and it cleared quickly)  2. Chronic atrial fibrillation (HCC) -continues on her metoprolol and baby aspirin only due to intracerebral hemorrhage in past, several hematomas from falls also -remains at high risk of stroke  3. Chronic diastolic congestive heart failure (HCC) -cont spironolactone, lasix, potassium, metoprolol -no current signs of acute exacerbation  Labs/tests ordered: No orders of the defined types were placed in this encounter. just had full labs at ED  Next appt:  03/23/2016--reschedule for November; discussed that she would not have had go to the ED for her shortness of breath--we could have gotten her in at the office for an appt--her daughter will relay to pt's sister  Tenzin Pavon L. Kynlei Piontek, D.O. [facility] [address], [place] [address] Cell Phone (Mon-Fri 8am-5pm):  [phone] On Call:  [phone] & follow prompts after 5pm & weekends Office Phone:  [phone] Office Fax:  [phone]

## 2016-02-28 NOTE — Progress Notes (Signed)
I agree with the above plan 

## 2016-02-29 ENCOUNTER — Encounter: Payer: Self-pay | Admitting: Internal Medicine

## 2016-03-14 ENCOUNTER — Ambulatory Visit (HOSPITAL_COMMUNITY)
Admission: RE | Admit: 2016-03-14 | Discharge: 2016-03-14 | Disposition: A | Discharge status: Home / Self Care | Payer: Medicare Other | Source: Ambulatory Visit | Attending: Internal Medicine | Admitting: Internal Medicine

## 2016-03-14 ENCOUNTER — Ambulatory Visit (INDEPENDENT_AMBULATORY_CARE_PROVIDER_SITE_OTHER): Payer: Medicare Other | Admitting: Internal Medicine

## 2016-03-14 ENCOUNTER — Encounter: Payer: Self-pay | Admitting: Internal Medicine

## 2016-03-14 VITALS — BP 158/90 | HR 72 | Temp 97.4°F | Ht 68.0 in | Wt 147.8 lb

## 2016-03-14 DIAGNOSIS — I160 Hypertensive urgency: Secondary | ICD-10-CM | POA: Diagnosis not present

## 2016-03-14 DIAGNOSIS — E034 Atrophy of thyroid (acquired): Secondary | ICD-10-CM

## 2016-03-14 DIAGNOSIS — R0602 Shortness of breath: Secondary | ICD-10-CM

## 2016-03-14 DIAGNOSIS — F0151 Vascular dementia with behavioral disturbance: Secondary | ICD-10-CM

## 2016-03-14 DIAGNOSIS — G319 Degenerative disease of nervous system, unspecified: Secondary | ICD-10-CM | POA: Insufficient documentation

## 2016-03-14 DIAGNOSIS — R51 Headache: Secondary | ICD-10-CM | POA: Diagnosis not present

## 2016-03-14 DIAGNOSIS — E038 Other specified hypothyroidism: Secondary | ICD-10-CM | POA: Diagnosis not present

## 2016-03-14 DIAGNOSIS — I482 Chronic atrial fibrillation, unspecified: Secondary | ICD-10-CM

## 2016-03-14 DIAGNOSIS — R519 Headache, unspecified: Secondary | ICD-10-CM

## 2016-03-14 DIAGNOSIS — I5032 Chronic diastolic (congestive) heart failure: Secondary | ICD-10-CM | POA: Diagnosis not present

## 2016-03-14 DIAGNOSIS — I638 Other cerebral infarction: Secondary | ICD-10-CM | POA: Diagnosis not present

## 2016-03-14 LAB — COMPLETE METABOLIC PANEL WITH GFR
ALBUMIN: 3.3 g/dL — AB (ref 3.6–5.1)
ALK PHOS: 93 U/L (ref 33–130)
ALT: 46 U/L — AB (ref 6–29)
AST: 42 U/L — ABNORMAL HIGH (ref 10–35)
BUN: 16 mg/dL (ref 7–25)
CO2: 23 mmol/L (ref 20–31)
Calcium: 9.4 mg/dL (ref 8.6–10.4)
Chloride: 106 mmol/L (ref 98–110)
Creat: 1.11 mg/dL — ABNORMAL HIGH (ref 0.60–0.88)
GFR, EST AFRICAN AMERICAN: 53 mL/min — AB (ref >=60)
GFR, EST NON AFRICAN AMERICAN: 46 mL/min — AB (ref >=60)
Glucose, Bld: 100 mg/dL — ABNORMAL HIGH (ref 65–99)
Potassium: 4.5 mmol/L (ref 3.5–5.3)
SODIUM: 139 mmol/L (ref 135–146)
TOTAL PROTEIN: 6.3 g/dL (ref 6.1–8.1)
Total Bilirubin: 0.9 mg/dL (ref 0.2–1.2)

## 2016-03-14 LAB — CBC WITH DIFFERENTIAL/PLATELET
BASOS ABS: 0 {cells}/uL (ref 0–200)
Basophils Relative: 0 %
EOS ABS: 52 {cells}/uL (ref 15–500)
Eosinophils Relative: 1 %
HCT: 32.7 % — ABNORMAL LOW (ref 35.0–45.0)
HEMOGLOBIN: 10.1 g/dL — AB (ref 11.7–15.5)
LYMPHS ABS: 1612 {cells}/uL (ref 850–3900)
Lymphocytes Relative: 31 %
MCH: 21.4 pg — AB (ref 27.0–33.0)
MCHC: 30.9 g/dL — ABNORMAL LOW (ref 32.0–36.0)
MCV: 69.4 fL — AB (ref 80.0–100.0)
Monocytes Absolute: 624 cells/uL (ref 200–950)
Monocytes Relative: 12 %
NEUTROS ABS: 2912 {cells}/uL (ref 1500–7800)
Neutrophils Relative %: 56 %
Platelets: 354 10*3/uL (ref 140–400)
RBC: 4.71 MIL/uL (ref 3.80–5.10)
RDW: 22.1 % — ABNORMAL HIGH (ref 11.0–15.0)
WBC: 5.2 10*3/uL (ref 3.8–10.8)

## 2016-03-14 LAB — TSH: TSH: 2.76 mIU/L

## 2016-03-14 NOTE — Patient Instructions (Addendum)
Await CT results prior to any medication adjustments  Follow up with neurology and cardiology as scheduled  Continue medications as ordered for now  Follow up with Dr Mariea Clonts in 2 weeks for HTN

## 2016-03-14 NOTE — Progress Notes (Signed)
Patient ID: PANZY BUBECK, female   DOB: 10/20/1930, 82 y.o.   MRN: [id number]    Location:  [facility] Place of Service: OFFICE  Chief Complaint  Patient presents with  . Acute Visit    SOB, elevated BP 175/104 this morning about 9:30 at 11:45 157/80  . Other    Here with God Daughter [name] and Homecare aid    HPI:  82 yo female with hx afib, ICH, hypothyroidism, CKD seen today for elevated BP with associated temporal HA and vision changes (blurry). This AM BP elevated while at the dentist 175/104. Prior to appt, she was rushing to get to dentist. 2 hr later, BP 157/80. Prior to OV today it was 168/94. She has SOB with exertion. She was tx for RLL pneumonia in July and completed 10 days of levaquin without complication. Not sure if thyroid med taken alone every day. No CP but has palpitations.   Past Medical History:  Diagnosis Date  . AAA (abdominal aortic aneurysm) ([place])   . Acute upper respiratory infections of unspecified site   . Anemia   . Anxiety   . Aortic aneurysm of unspecified site without mention of rupture   . Arthritis   . Atrial fibrillation ([place])   . Atrial fibrillation ([place])   . Cervicalgia   . CHF (congestive heart failure) ([place])   . Cholelithiasis   . Chronic systolic heart failure ([place])   . CKD (chronic kidney disease)   . Colon cancer ([place]) 07/1997  . Congestive heart failure, unspecified   . Depression   . Diabetes mellitus   . Diabetes mellitus ([place])   . Dizziness and giddiness   . Electrolyte and fluid disorders not elsewhere classified   . GERD (gastroesophageal reflux disease)   . Gout, unspecified   . Hemorrhage of rectum and anus   . Hypercholesteremia   . Hypertension   . Hypoglycemia, unspecified   . Hypopotassemia   . Hypothyroidism   . Kidney stone    renal calculi  . Malignant neoplasm of colon, unspecified site   . Other and unspecified hyperlipidemia   . Other chronic allergic conjunctivitis   . Other specified cardiac  dysrhythmias(427.89)   . Pain in joint, lower leg   . Pain in joint, site unspecified   . Palpitations   . Perforation of bile duct   . Proteinuria   . Shortness of breath   . Small bowel obstruction due to adhesions ([place]) 05/16/2012  . Stroke ([place])   . Swelling, mass, or lump in head and neck   . Thoracic aneurysm without mention of rupture     Past Surgical History:  Procedure Laterality Date  . CARDIAC CATHETERIZATION  2008   moderate severe pulm HTN; with elevted pulm capillary wedge pressure   . CHOLECYSTECTOMY  05/14/2012   Procedure: LAPAROSCOPIC CHOLECYSTECTOMY WITH INTRAOPERATIVE CHOLANGIOGRAM;  Surgeon: Haywood Lasso, MD;  Location: [facility];  Service: General;  Laterality: N/A;  . COLON SURGERY     to remove colon ca  . ERCP  05/17/2012   Procedure: ENDOSCOPIC RETROGRADE CHOLANGIOPANCREATOGRAPHY (ERCP);  Surgeon: Missy Sabins, MD;  Location: [facility];  Service: Gastroenterology;  Laterality: N/A;  . ERCP  08/12/2012   Procedure: ENDOSCOPIC RETROGRADE CHOLANGIOPANCREATOGRAPHY (ERCP);  Surgeon: Missy Sabins, MD;  Location[facility] ENDOSCOPY;  Service: Endoscopy;  Laterality: N/A;  . HEMICOLECTOMY  08/11/1997  . KIDNEY STONE SURGERY    . TRANSTHORACIC ECHOCARDIOGRAM  11/2009   EF 45-50%, mild-mod AV regurg;  mild MR; mod TR; ascending aorta mildly dilated    Patient Care Team: [name], DO as PCP - General (Geriatric Medicine) Neldon Mc, MD as Consulting Physician (General Surgery) Pixie Casino, MD as Consulting Physician (Cardiology) Maia Breslow, MD as Consulting Physician (Orthopedic Surgery) Rutherford Guys, MD as Consulting Physician (Ophthalmology)  Social History   Social History  . Marital status: Married    Spouse name: N/A  . Number of children: N/A  . Years of education: N/A   Occupational History  . Not on file.   Social History Main Topics  . Smoking status: Never Smoker  . Smokeless tobacco: Never Used  . Alcohol use No  . Drug use: No    . Sexual activity: No   Other Topics Concern  . Not on file   Social History Narrative  . No narrative on file     reports that she has never smoked. She has never used smokeless tobacco. She reports that she does not drink alcohol or use drugs.  Family History  Problem Relation Age of Onset  . Heart disease Mother   . Stroke Father   . Hypertension Daughter   . Hypertension Son   . Diabetes Sister   . Hypertension Son    Family Status  Relation Status  . Mother Deceased  . Father Deceased  . Sister Alive  . Brother Deceased   liver disease  . Daughter Alive  . Son Deceased  . Brother Alive  . Sister Alive  . Son Alive   Emphysema  . Son Alive     Allergies  Allergen Reactions  . Iohexol      Code: VOM, Desc: PT REPORTS VOMITING W/ IVP DYE- ARS 12/26/08, Onset Date: 16109604   . Z-Pak [Azithromycin]   . Iodinated Diagnostic Agents Nausea Only    PT HAS HAD SINGULAR INCIDENT OF NAUSEA  W/ IV CONTYRAST INJECTION, NONE IF INJECTED SLOWLY//A.CALHOUN    Medications: Patient's Medications  New Prescriptions   No medications on file  Previous Medications   ALLOPURINOL (ZYLOPRIM) 100 MG TABLET    Take 1 tablet (100 mg total) by mouth daily.   ASPIRIN EC 81 MG TABLET    Take 81 mg by mouth daily.   BUTALBITAL-ACETAMINOPHEN-CAFFEINE (FIORICET, ESGIC) 50-325-40 MG TABLET    Take 1 tablet by mouth every 6 (six) hours as needed for headache.   CLONIDINE (CATAPRES) 0.2 MG TABLET    Take 0.2 mg by mouth 2 (two) times daily.   DONEPEZIL (ARICEPT) 10 MG TABLET    Take 1 tablet (10 mg total) by mouth at bedtime.   FEEDING SUPPLEMENT, GLUCERNA SHAKE, (GLUCERNA SHAKE) LIQD    Take 237 mLs by mouth 2 (two) times daily as needed (for poor appetite).   FUROSEMIDE (LASIX) 20 MG TABLET    Take 1 tablet by mouth daily, Take 1 extra tablet for more than a 3lb weight gain in a day along with potassium.   GLUCOSE BLOOD (ACCU-CHEK AVIVA) TEST STRIP    Use to check blood sugar daily. Dx:  E11.21   HYDRALAZINE (APRESOLINE) 50 MG TABLET    Take 1 tablet (50 mg total) by mouth every 8 (eight) hours.   ISOSORBIDE MONONITRATE (IMDUR) 30 MG 24 HR TABLET    Take 1 tablet (30 mg total) by mouth daily.   LANCETS (ACCU-CHEK SOFT TOUCH) LANCETS    Use as instructed   LEVOTHYROXINE (SYNTHROID, LEVOTHROID) 50 MCG TABLET    Take one tablet by  mouth 30 minutes before breakfast away from other medications for thyroid   MELATONIN 5 MG CAPS    Take 1 capsule (5 mg total) by mouth at bedtime as needed (insomnia).   METOPROLOL (LOPRESSOR) 100 MG TABLET    Take 1 tablet (100 mg total) by mouth 2 (two) times daily.   PANTOPRAZOLE SODIUM (PROTONIX) 40 MG/20 ML PACK    Take 40 mg by mouth daily as needed (acid reflux/ heart burn).    POLYETHYLENE GLYCOL (MIRALAX / GLYCOLAX) PACKET    Take 17 g by mouth daily as needed for moderate constipation.   POTASSIUM CHLORIDE (K-DUR,KLOR-CON) 10 MEQ TABLET    Take 10 mEq by mouth daily. Taking along with lasix   SPIRONOLACTONE (ALDACTONE) 25 MG TABLET    Take 1 tablet (25 mg total) by mouth daily.  Modified Medications   No medications on file  Discontinued Medications   No medications on file    Review of Systems  Unable to perform ROS: Dementia    Vitals:   03/14/16 1453  BP: (!) 164/92  Pulse: 72  Temp: 97.4 F (36.3 C)  TempSrc: Oral  SpO2: 98%  Weight: 147 lb 12.8 oz (67 kg)  Height: 5' 8"  (1.727 m)   Body mass index is 22.47 kg/m.  Physical Exam  Constitutional: She appears well-developed and well-nourished.  HENT:  Mouth/Throat: Oropharynx is clear and moist. No oropharyngeal exudate.  TMs appear nml. No bulging or redness  Eyes: Pupils are equal, round, and reactive to light. No scleral icterus.  Neck: Neck supple. Carotid bruit is not present. No tracheal deviation present. No thyromegaly present.  Cardiovascular: Normal rate and intact distal pulses.  An irregularly irregular rhythm present. Exam reveals no gallop and no friction  rub.   Murmur heard.  Systolic murmur is present with a grade of 1/6  trcae LE edema b/l. No calf TTP  Pulmonary/Chest: Effort normal and breath sounds normal. No stridor. No respiratory distress. She has no wheezes. She has no rales.  Abdominal: Soft. Bowel sounds are normal. She exhibits no distension and no mass. There is no hepatomegaly. There is no tenderness. There is no rebound and no guarding.  Lymphadenopathy:    She has no cervical adenopathy.  Neurological: She is alert.  Skin: Skin is warm and dry. No rash noted.  Psychiatric: She has a normal mood and affect. Her behavior is normal.     Labs reviewed: Admission on 02/17/2016, Discharged on 02/17/2016  Component Date Value Ref Range Status  . WBC 02/17/2016 6.3  4.0 - 10.5 K/uL Final  . RBC 02/17/2016 4.81  3.87 - 5.11 MIL/uL Final  . Hemoglobin 02/17/2016 10.2* 12.0 - 15.0 g/dL Final  . HCT 02/17/2016 30.6* 36.0 - 46.0 % Final  . MCV 02/17/2016 63.6* 78.0 - 100.0 fL Final  . MCH 02/17/2016 21.2* 26.0 - 34.0 pg Final  . MCHC 02/17/2016 33.3  30.0 - 36.0 g/dL Final  . RDW 02/17/2016 22.5* 11.5 - 15.5 % Final  . Platelets 02/17/2016 315  150 - 400 K/uL Final  . Neutrophils Relative % 02/17/2016 68  % Final  . Lymphocytes Relative 02/17/2016 21  % Final  . Monocytes Relative 02/17/2016 10  % Final  . Eosinophils Relative 02/17/2016 1  % Final  . Basophils Relative 02/17/2016 0  % Final  . Neutro Abs 02/17/2016 4.3  1.7 - 7.7 K/uL Final  . Lymphs Abs 02/17/2016 1.3  0.7 - 4.0 K/uL Final  . Monocytes Absolute  02/17/2016 0.6  0.1 - 1.0 K/uL Final  . Eosinophils Absolute 02/17/2016 0.1  0.0 - 0.7 K/uL Final  . Basophils Absolute 02/17/2016 0.0  0.0 - 0.1 K/uL Final  . RBC Morphology 02/17/2016 TARGET CELLS   Final  . Smear Review 02/17/2016 LARGE PLATELETS PRESENT   Final  . Sodium 02/17/2016 138  135 - 145 mmol/L Final  . Potassium 02/17/2016 4.1  3.5 - 5.1 mmol/L Final  . Chloride 02/17/2016 105  101 - 111 mmol/L Final    . CO2 02/17/2016 26  22 - 32 mmol/L Final  . Glucose, Bld 02/17/2016 147* 65 - 99 mg/dL Final  . BUN 02/17/2016 21* 6 - 20 mg/dL Final  . Creatinine, Ser 02/17/2016 1.32* 0.44 - 1.00 mg/dL Final  . Calcium 02/17/2016 8.8* 8.9 - 10.3 mg/dL Final  . Total Protein 02/17/2016 7.0  6.5 - 8.1 g/dL Final  . Albumin 02/17/2016 3.3* 3.5 - 5.0 g/dL Final  . AST 02/17/2016 27  15 - 41 U/L Final  . ALT 02/17/2016 21  14 - 54 U/L Final  . Alkaline Phosphatase 02/17/2016 85  38 - 126 U/L Final  . Total Bilirubin 02/17/2016 0.9  0.3 - 1.2 mg/dL Final  . GFR calc non Af Amer 02/17/2016 36* >60 mL/min Final  . GFR calc Af Amer 02/17/2016 42* >60 mL/min Final   Comment: (NOTE) The eGFR has been calculated using the CKD EPI equation. This calculation has not been validated in all clinical situations. eGFR's persistently <60 mL/min signify possible Chronic Kidney Disease.   . Anion gap 02/17/2016 7  5 - 15 Final  . Lactic Acid, Venous 02/17/2016 1.41  0.5 - 1.9 mmol/L Final  . Specimen Description 02/22/2016 BLOOD LEFT ANTECUBITAL   Final  . Special Requests 02/22/2016 BOTTLES DRAWN AEROBIC AND ANAEROBIC 5 CC EA   Final  . Culture 02/22/2016    Final                   Value:NO GROWTH 5 DAYS Performed at [facility]   . Report Status 02/22/2016 02/22/2016 FINAL   Final  . Specimen Description 02/22/2016 BLOOD BLOOD RIGHT FOREARM   Final  . Special Requests 02/22/2016 BOTTLES DRAWN AEROBIC ONLY 8 CC   Final  . Culture 02/22/2016    Final                   Value:NO GROWTH 5 DAYS Performed at [facility]   . Report Status 02/22/2016 02/22/2016 FINAL   Final  . B Natriuretic Peptide 02/17/2016 495.4* 0.0 - 100.0 pg/mL Final  . Troponin i, poc 02/17/2016 0.00  0.00 - 0.08 ng/mL Final  . Comment 3 02/17/2016          Final   Comment: Due to the release kinetics of cTnI, a negative result within the first hours of the onset of symptoms does not rule out myocardial infarction with  certainty. If myocardial infarction is still suspected, repeat the test at appropriate intervals.   . Magnesium 02/17/2016 1.7  1.7 - 2.4 mg/dL Final  . Lactic Acid, Venous 02/17/2016 1.96* 0.5 - 1.9 mmol/L Final  . Troponin i, poc 02/17/2016 0.00  0.00 - 0.08 ng/mL Final  . Comment 3 02/17/2016          Final   Comment: Due to the release kinetics of cTnI, a negative result within the first hours of the onset of symptoms does not rule out myocardial infarction with certainty.  If myocardial infarction is still suspected, repeat the test at appropriate intervals.   Appointment on 01/16/2016  Component Date Value Ref Range Status  . Hgb A1c MFr Bld 01/17/2016 6.9* 4.8 - 5.6 % Final   Comment:          Pre-diabetes: 5.7 - 6.4          Diabetes: >6.4          Glycemic control for adults with diabetes: <7.0   . Est. average glucose Bld gHb Est-m* 01/17/2016 151  mg/dL Final  . Glucose 01/17/2016 117* 65 - 99 mg/dL Final  . BUN 01/17/2016 17  8 - 27 mg/dL Final  . Creatinine, Ser 01/17/2016 1.19* 0.57 - 1.00 mg/dL Final  . GFR calc non Af Amer 01/17/2016 43* >59 mL/min/1.73 Final  . GFR calc Af Amer 01/17/2016 49* >59 mL/min/1.73 Final  . BUN/Creatinine Ratio 01/17/2016 14  12 - 28 Final  . Sodium 01/17/2016 145* 134 - 144 mmol/L Final  . Potassium 01/17/2016 4.4  3.5 - 5.2 mmol/L Final  . Chloride 01/17/2016 105  96 - 106 mmol/L Final  . CO2 01/17/2016 26  18 - 29 mmol/L Final  . Calcium 01/17/2016 9.0  8.7 - 10.3 mg/dL Final  Admission on 01/05/2016, Discharged on 01/08/2016  Component Date Value Ref Range Status  . WBC 01/05/2016 7.1  4.0 - 10.5 K/uL Final  . RBC 01/05/2016 5.04  3.87 - 5.11 MIL/uL Final  . Hemoglobin 01/05/2016 10.2* 12.0 - 15.0 g/dL Final  . HCT 01/05/2016 30.9* 36.0 - 46.0 % Final  . MCV 01/05/2016 61.3* 78.0 - 100.0 fL Final  . MCH 01/05/2016 20.2* 26.0 - 34.0 pg Final  . MCHC 01/05/2016 33.0  30.0 - 36.0 g/dL Final  . RDW 01/05/2016 21.7* 11.5 - 15.5 %  Final  . Platelets 01/05/2016 273  150 - 400 K/uL Final  . Sodium 01/05/2016 139  135 - 145 mmol/L Final  . Potassium 01/05/2016 3.3* 3.5 - 5.1 mmol/L Final  . Chloride 01/05/2016 106  101 - 111 mmol/L Final  . CO2 01/05/2016 26  22 - 32 mmol/L Final  . Glucose, Bld 01/05/2016 160* 65 - 99 mg/dL Final  . BUN 01/05/2016 14  6 - 20 mg/dL Final  . Creatinine, Ser 01/05/2016 1.22* 0.44 - 1.00 mg/dL Final  . Calcium 01/05/2016 8.5* 8.9 - 10.3 mg/dL Final  . Total Protein 01/05/2016 6.8  6.5 - 8.1 g/dL Final  . Albumin 01/05/2016 3.2* 3.5 - 5.0 g/dL Final  . AST 01/05/2016 73* 15 - 41 U/L Final  . ALT 01/05/2016 38  14 - 54 U/L Final  . Alkaline Phosphatase 01/05/2016 134* 38 - 126 U/L Final  . Total Bilirubin 01/05/2016 0.7  0.3 - 1.2 mg/dL Final  . GFR calc non Af Amer 01/05/2016 40* >60 mL/min Final  . GFR calc Af Amer 01/05/2016 46* >60 mL/min Final   Comment: (NOTE) The eGFR has been calculated using the CKD EPI equation. This calculation has not been validated in all clinical situations. eGFR's persistently <60 mL/min signify possible Chronic Kidney Disease.   . Anion gap 01/05/2016 7  5 - 15 Final  . B Natriuretic Peptide 01/05/2016 1249.2* 0.0 - 100.0 pg/mL Final  . Troponin I 01/05/2016 <0.03  <0.031 ng/mL Final   Comment:        NO INDICATION OF MYOCARDIAL INJURY.   . Troponin I 01/05/2016 <0.03  <0.031 ng/mL Final   Comment:  NO INDICATION OF MYOCARDIAL INJURY.   . Troponin I 01/06/2016 <0.03  <0.031 ng/mL Final   Comment:        NO INDICATION OF MYOCARDIAL INJURY.   . Troponin I 01/06/2016 <0.03  <0.031 ng/mL Final   Comment:        NO INDICATION OF MYOCARDIAL INJURY.   . WBC 01/06/2016 7.1  4.0 - 10.5 K/uL Final  . RBC 01/06/2016 5.04  3.87 - 5.11 MIL/uL Final  . Hemoglobin 01/06/2016 10.1* 12.0 - 15.0 g/dL Final  . HCT 01/06/2016 30.0* 36.0 - 46.0 % Final  . MCV 01/06/2016 59.5* 78.0 - 100.0 fL Final  . MCH 01/06/2016 20.0* 26.0 - 34.0 pg Final  .  MCHC 01/06/2016 33.7  30.0 - 36.0 g/dL Final  . RDW 01/06/2016 21.9* 11.5 - 15.5 % Final  . Platelets 01/06/2016 296  150 - 400 K/uL Final  . Neutrophils Relative % 01/06/2016 72  % Final  . Lymphocytes Relative 01/06/2016 19  % Final  . Monocytes Relative 01/06/2016 8  % Final  . Eosinophils Relative 01/06/2016 1  % Final  . Basophils Relative 01/06/2016 0  % Final  . Neutro Abs 01/06/2016 5.1  1.7 - 7.7 K/uL Final  . Lymphs Abs 01/06/2016 1.3  0.7 - 4.0 K/uL Final  . Monocytes Absolute 01/06/2016 0.6  0.1 - 1.0 K/uL Final  . Eosinophils Absolute 01/06/2016 0.1  0.0 - 0.7 K/uL Final  . Basophils Absolute 01/06/2016 0.0  0.0 - 0.1 K/uL Final  . RBC Morphology 01/06/2016 POLYCHROMASIA PRESENT   Final   Comment: ELLIPTOCYTES SCHISTOCYTES NOTED ON SMEAR TARGET CELLS   . Sodium 01/06/2016 137  135 - 145 mmol/L Final  . Potassium 01/06/2016 3.7  3.5 - 5.1 mmol/L Final  . Chloride 01/06/2016 105  101 - 111 mmol/L Final  . CO2 01/06/2016 25  22 - 32 mmol/L Final  . Glucose, Bld 01/06/2016 141* 65 - 99 mg/dL Final  . BUN 01/06/2016 13  6 - 20 mg/dL Final  . Creatinine, Ser 01/06/2016 0.93  0.44 - 1.00 mg/dL Final  . Calcium 01/06/2016 8.6* 8.9 - 10.3 mg/dL Final  . Total Protein 01/06/2016 6.8  6.5 - 8.1 g/dL Final  . Albumin 01/06/2016 3.2* 3.5 - 5.0 g/dL Final  . AST 01/06/2016 37  15 - 41 U/L Final  . ALT 01/06/2016 33  14 - 54 U/L Final  . Alkaline Phosphatase 01/06/2016 124  38 - 126 U/L Final  . Total Bilirubin 01/06/2016 0.8  0.3 - 1.2 mg/dL Final  . GFR calc non Af Amer 01/06/2016 56* >60 mL/min Final  . GFR calc Af Amer 01/06/2016 >60  >60 mL/min Final   Comment: (NOTE) The eGFR has been calculated using the CKD EPI equation. This calculation has not been validated in all clinical situations. eGFR's persistently <60 mL/min signify possible Chronic Kidney Disease.   . Anion gap 01/06/2016 7  5 - 15 Final  . Magnesium 01/06/2016 1.3* 1.7 - 2.4 mg/dL Final  . B Natriuretic  Peptide 01/06/2016 1178.4* 0.0 - 100.0 pg/mL Final  . Sodium 01/07/2016 137  135 - 145 mmol/L Final  . Potassium 01/07/2016 3.2* 3.5 - 5.1 mmol/L Final  . Chloride 01/07/2016 100* 101 - 111 mmol/L Final  . CO2 01/07/2016 26  22 - 32 mmol/L Final  . Glucose, Bld 01/07/2016 140* 65 - 99 mg/dL Final  . BUN 01/07/2016 11  6 - 20 mg/dL Final  . Creatinine, Ser 01/07/2016 0.93  0.44 -  1.00 mg/dL Final  . Calcium 01/07/2016 8.8* 8.9 - 10.3 mg/dL Final  . GFR calc non Af Amer 01/07/2016 56* >60 mL/min Final  . GFR calc Af Amer 01/07/2016 >60  >60 mL/min Final   Comment: (NOTE) The eGFR has been calculated using the CKD EPI equation. This calculation has not been validated in all clinical situations. eGFR's persistently <60 mL/min signify possible Chronic Kidney Disease.   . Anion gap 01/07/2016 11  5 - 15 Final  . Magnesium 01/07/2016 1.5* 1.7 - 2.4 mg/dL Final  . Lipase 01/07/2016 51  11 - 51 U/L Final  . WBC 01/08/2016 8.6  4.0 - 10.5 K/uL Final  . RBC 01/08/2016 5.73* 3.87 - 5.11 MIL/uL Final  . Hemoglobin 01/08/2016 11.4* 12.0 - 15.0 g/dL Final  . HCT 01/08/2016 34.2* 36.0 - 46.0 % Final  . MCV 01/08/2016 59.7* 78.0 - 100.0 fL Final  . MCH 01/08/2016 19.9* 26.0 - 34.0 pg Final  . MCHC 01/08/2016 33.3  30.0 - 36.0 g/dL Final  . RDW 01/08/2016 21.8* 11.5 - 15.5 % Final  . Platelets 01/08/2016 352  150 - 400 K/uL Final  . Sodium 01/08/2016 138  135 - 145 mmol/L Final  . Potassium 01/08/2016 3.3* 3.5 - 5.1 mmol/L Final  . Chloride 01/08/2016 99* 101 - 111 mmol/L Final  . CO2 01/08/2016 29  22 - 32 mmol/L Final  . Glucose, Bld 01/08/2016 144* 65 - 99 mg/dL Final  . BUN 01/08/2016 18  6 - 20 mg/dL Final  . Creatinine, Ser 01/08/2016 1.23* 0.44 - 1.00 mg/dL Final  . Calcium 01/08/2016 9.1  8.9 - 10.3 mg/dL Final  . GFR calc non Af Amer 01/08/2016 40* >60 mL/min Final  . GFR calc Af Amer 01/08/2016 46* >60 mL/min Final   Comment: (NOTE) The eGFR has been calculated using the CKD EPI  equation. This calculation has not been validated in all clinical situations. eGFR's persistently <60 mL/min signify possible Chronic Kidney Disease.   . Anion gap 01/08/2016 10  5 - 15 Final  . Magnesium 01/08/2016 1.5* 1.7 - 2.4 mg/dL Final    Dg Chest 2 View  Result Date: 02/17/2016 CLINICAL DATA:  Shortness of breath. EXAM: CHEST  2 VIEW COMPARISON:  Radiograph of January 05, 2016. FINDINGS: Stable cardiomegaly. No pneumothorax is noted. Atherosclerosis of thoracic aorta is noted. Left lung is clear. Right basilar opacity is noted concerning for atelectasis or infiltrate. Bony thorax is unremarkable. IMPRESSION: Aortic atherosclerosis. Mild right basilar opacity is noted concerning for atelectasis or infiltrate. Electronically Signed   By: Marijo Conception, M.D.   On: 02/17/2016 11:01    Assessment/Plan   ICD-9-CM ICD-10-CM   1. Hypertensive urgency with visual changes 401.9 I16.0 CT Head Wo Contrast     CBC with Differential/Platelets     CMP with eGFR  2. Headache, unspecified headache type probably due to #1 784.0 R51 CT Head Wo Contrast     CBC with Differential/Platelets     CMP with eGFR  3. Chronic atrial fibrillation (HCC) - rate controlled 427.31 I48.2 CT Head Wo Contrast     CMP with eGFR  4. Hypothyroidism due to acquired atrophy of thyroid 244.8 E03.8 TSH   246.8 E03.4   5. Vascular dementia, with behavioral disturbance 290.40 F01.51   6. Chronic diastolic congestive heart failure (HCC) 428.32 I50.32 CMP with eGFR   428.0    7. SOB (shortness of breath) with exertion and likely related to #1 786.05 R06.02 CBC with  Differential/Platelets     CMP with eGFR    Reviewed chart. Neuro goal BP <130/90. She is not a watchman candidate. She is at high risk of stroke due to not anticoagulant candidate due to [place]. She takes ASA daily  May need to adjust hydralazine to QID instead of q8hr for better BP control  Await CT results prior to any medication adjustments  Follow up  with neurology and cardiology as scheduled  Continue medications as ordered for now  Follow up with Dr Mariea Clonts in 2 weeks for HTN   Ellie Spickler S. Perlie Gold  [facility] [address] [place], [place] [address] [phone] Cell (Monday-Friday 8 AM - 5 PM) [phone] After 5 PM and follow prompts

## 2016-03-16 ENCOUNTER — Other Ambulatory Visit: Payer: Self-pay

## 2016-03-16 MED ORDER — HYDRALAZINE HCL 50 MG PO TABS: 50.0000 mg | ORAL_TABLET | Freq: Four times a day (QID) | ORAL | 3 refills | Status: DC

## 2016-03-23 ENCOUNTER — Ambulatory Visit: Payer: Medicare Other | Admitting: Internal Medicine

## 2016-03-31 ENCOUNTER — Other Ambulatory Visit: Payer: Self-pay | Admitting: Internal Medicine

## 2016-04-09 ENCOUNTER — Encounter (HOSPITAL_COMMUNITY): Payer: Self-pay | Admitting: Emergency Medicine

## 2016-04-09 ENCOUNTER — Observation Stay (HOSPITAL_COMMUNITY)
Admission: EM | Admit: 2016-04-09 | Discharge: 2016-04-12 | Disposition: A | Discharge status: Home / Self Care | Payer: Medicare Other | Attending: Internal Medicine | Admitting: Internal Medicine

## 2016-04-09 ENCOUNTER — Emergency Department (HOSPITAL_COMMUNITY): Payer: Medicare Other

## 2016-04-09 DIAGNOSIS — I110 Hypertensive heart disease with heart failure: Secondary | ICD-10-CM | POA: Diagnosis not present

## 2016-04-09 DIAGNOSIS — Z8673 Personal history of transient ischemic attack (TIA), and cerebral infarction without residual deficits: Secondary | ICD-10-CM | POA: Diagnosis not present

## 2016-04-09 DIAGNOSIS — F0150 Vascular dementia without behavioral disturbance: Secondary | ICD-10-CM | POA: Diagnosis not present

## 2016-04-09 DIAGNOSIS — E034 Atrophy of thyroid (acquired): Secondary | ICD-10-CM | POA: Diagnosis not present

## 2016-04-09 DIAGNOSIS — F0390 Unspecified dementia without behavioral disturbance: Secondary | ICD-10-CM | POA: Insufficient documentation

## 2016-04-09 DIAGNOSIS — E1149 Type 2 diabetes mellitus with other diabetic neurological complication: Secondary | ICD-10-CM | POA: Diagnosis not present

## 2016-04-09 DIAGNOSIS — E1122 Type 2 diabetes mellitus with diabetic chronic kidney disease: Secondary | ICD-10-CM | POA: Diagnosis not present

## 2016-04-09 DIAGNOSIS — Z7901 Long term (current) use of anticoagulants: Secondary | ICD-10-CM | POA: Insufficient documentation

## 2016-04-09 DIAGNOSIS — D649 Anemia, unspecified: Secondary | ICD-10-CM | POA: Insufficient documentation

## 2016-04-09 DIAGNOSIS — Z7982 Long term (current) use of aspirin: Secondary | ICD-10-CM | POA: Insufficient documentation

## 2016-04-09 DIAGNOSIS — I481 Persistent atrial fibrillation: Secondary | ICD-10-CM | POA: Diagnosis not present

## 2016-04-09 DIAGNOSIS — Z66 Do not resuscitate: Secondary | ICD-10-CM | POA: Diagnosis not present

## 2016-04-09 DIAGNOSIS — I160 Hypertensive urgency: Secondary | ICD-10-CM | POA: Diagnosis not present

## 2016-04-09 DIAGNOSIS — E7800 Pure hypercholesterolemia, unspecified: Secondary | ICD-10-CM | POA: Insufficient documentation

## 2016-04-09 DIAGNOSIS — I5023 Acute on chronic systolic (congestive) heart failure: Secondary | ICD-10-CM

## 2016-04-09 DIAGNOSIS — I509 Heart failure, unspecified: Secondary | ICD-10-CM

## 2016-04-09 DIAGNOSIS — I5033 Acute on chronic diastolic (congestive) heart failure: Secondary | ICD-10-CM | POA: Insufficient documentation

## 2016-04-09 DIAGNOSIS — I482 Chronic atrial fibrillation: Secondary | ICD-10-CM | POA: Diagnosis not present

## 2016-04-09 DIAGNOSIS — I712 Thoracic aortic aneurysm, without rupture, unspecified: Secondary | ICD-10-CM | POA: Diagnosis present

## 2016-04-09 DIAGNOSIS — E039 Hypothyroidism, unspecified: Secondary | ICD-10-CM | POA: Insufficient documentation

## 2016-04-09 DIAGNOSIS — E876 Hypokalemia: Secondary | ICD-10-CM | POA: Diagnosis not present

## 2016-04-09 DIAGNOSIS — E1142 Type 2 diabetes mellitus with diabetic polyneuropathy: Secondary | ICD-10-CM | POA: Diagnosis not present

## 2016-04-09 DIAGNOSIS — N183 Chronic kidney disease, stage 3 (moderate): Secondary | ICD-10-CM | POA: Insufficient documentation

## 2016-04-09 DIAGNOSIS — I130 Hypertensive heart and chronic kidney disease with heart failure and stage 1 through stage 4 chronic kidney disease, or unspecified chronic kidney disease: Principal | ICD-10-CM | POA: Insufficient documentation

## 2016-04-09 DIAGNOSIS — Z85038 Personal history of other malignant neoplasm of large intestine: Secondary | ICD-10-CM | POA: Diagnosis not present

## 2016-04-09 DIAGNOSIS — K219 Gastro-esophageal reflux disease without esophagitis: Secondary | ICD-10-CM | POA: Diagnosis not present

## 2016-04-09 DIAGNOSIS — R0602 Shortness of breath: Secondary | ICD-10-CM | POA: Diagnosis not present

## 2016-04-09 DIAGNOSIS — I4821 Permanent atrial fibrillation: Secondary | ICD-10-CM | POA: Diagnosis present

## 2016-04-09 DIAGNOSIS — I5031 Acute diastolic (congestive) heart failure: Secondary | ICD-10-CM | POA: Diagnosis present

## 2016-04-09 LAB — CBC
HEMATOCRIT: 31.6 % — AB (ref 36.0–46.0)
Hemoglobin: 10.4 g/dL — ABNORMAL LOW (ref 12.0–15.0)
MCH: 21.7 pg — AB (ref 26.0–34.0)
MCHC: 32.9 g/dL (ref 30.0–36.0)
MCV: 65.8 fL — AB (ref 78.0–100.0)
Platelets: 370 10*3/uL (ref 150–400)
RBC: 4.8 MIL/uL (ref 3.87–5.11)
RDW: 19.5 % — ABNORMAL HIGH (ref 11.5–15.5)
WBC: 5.8 10*3/uL (ref 4.0–10.5)

## 2016-04-09 LAB — I-STAT TROPONIN, ED: Troponin i, poc: 0.01 ng/mL (ref 0.00–0.08)

## 2016-04-09 LAB — BASIC METABOLIC PANEL
Anion gap: 6 (ref 5–15)
BUN: 17 mg/dL (ref 6–20)
CHLORIDE: 104 mmol/L (ref 101–111)
CO2: 26 mmol/L (ref 22–32)
Calcium: 8.8 mg/dL — ABNORMAL LOW (ref 8.9–10.3)
Creatinine, Ser: 1.26 mg/dL — ABNORMAL HIGH (ref 0.44–1.00)
GFR calc non Af Amer: 38 mL/min — ABNORMAL LOW (ref >60)
GFR, EST AFRICAN AMERICAN: 44 mL/min — AB (ref >60)
Glucose, Bld: 148 mg/dL — ABNORMAL HIGH (ref 65–99)
POTASSIUM: 3.6 mmol/L (ref 3.5–5.1)
SODIUM: 136 mmol/L (ref 135–145)

## 2016-04-09 MED ORDER — LABETALOL HCL 200 MG PO TABS
100.0000 mg | ORAL_TABLET | Freq: Once | ORAL | Status: AC
  Administered 2016-04-09: 100 mg via ORAL
  Filled 2016-04-09: qty 1

## 2016-04-09 MED ORDER — CLONIDINE HCL 0.1 MG PO TABS
0.1000 mg | ORAL_TABLET | Freq: Three times a day (TID) | ORAL | Status: DC | PRN
  Administered 2016-04-09: 0.1 mg via ORAL
  Filled 2016-04-09: qty 1

## 2016-04-09 NOTE — ED Triage Notes (Signed)
Pt has been feeling SOB for several days with hx of Afib. EKG shows Afib rate controlled. Denies CP. Pt states she does feel dizzy at times.

## 2016-04-09 NOTE — ED Provider Notes (Signed)
[facility] DEPT Provider Note   CSN: [id number] Arrival date & time: 04/09/16  1623     History   Chief Complaint Chief Complaint  Patient presents with  . Shortness of Breath    HPI Nicole [name] is a 82 y.o. female.  82 year old female with a history of atrial fibrillation, congestive heart failure, CKD, diabetes, hypertension, hypercholesterolemia, CVA, SBO presents with concern of shortness of breath. Symptoms present for the past week, progressively worsening. She reports a frontal headache, also for one week, without aggravating or alleviating factors. She denies other discomfort or pain. No fever, cough, nausea or vomiting. No syncope. She reports general weakness. SOB is present when at rest but worse with exertion.    The history is provided by the patient and the spouse. No language interpreter was used.  Shortness of Breath  Associated symptoms include headaches. Pertinent negatives include no fever and no abdominal pain.    Past Medical History:  Diagnosis Date  . AAA (abdominal aortic aneurysm) ([place])   . Acute upper respiratory infections of unspecified site   . Anemia   . Anxiety   . Aortic aneurysm of unspecified site without mention of rupture   . Arthritis   . Atrial fibrillation ([place])   . Atrial fibrillation ([place])   . Cervicalgia   . CHF (congestive heart failure) ([place])   . Cholelithiasis   . Chronic systolic heart failure ([place])   . CKD (chronic kidney disease)   . Colon cancer ([place]) 07/1997  . Congestive heart failure, unspecified   . Depression   . Diabetes mellitus   . Diabetes mellitus ([place])   . Dizziness and giddiness   . Electrolyte and fluid disorders not elsewhere classified   . GERD (gastroesophageal reflux disease)   . Gout, unspecified   . Hemorrhage of rectum and anus   . Hypercholesteremia   . Hypertension   . Hypoglycemia, unspecified   . Hypopotassemia   . Hypothyroidism   . Kidney stone    renal calculi  . Malignant  neoplasm of colon, unspecified site   . Other and unspecified hyperlipidemia   . Other chronic allergic conjunctivitis   . Other specified cardiac dysrhythmias(427.89)   . Pain in joint, lower leg   . Pain in joint, site unspecified   . Palpitations   . Perforation of bile duct   . Proteinuria   . Shortness of breath   . Small bowel obstruction due to adhesions ([place]) 05/16/2012  . Stroke ([place])   . Swelling, mass, or lump in head and neck   . Thoracic aneurysm without mention of rupture     Patient Active Problem List   Diagnosis Date Noted  . Abdominal pain, epigastric 01/07/2016  . Accelerated hypertension with diastolic congestive heart failure, NYHA class 3 ([place]) 01/05/2016  . Atrial fibrillation ([place]) 01/05/2016  . Rib pain on right side 10/14/2015  . Hypothyroidism due to acquired atrophy of thyroid 10/14/2015  . Type 2 diabetes mellitus with diabetic nephropathy, without long-term current use of insulin ([place]) 10/14/2015  . Essential hypertension 10/14/2015  . Acute on chronic systolic heart failure ([place]) 08/02/2015  . Acute on chronic diastolic heart failure ([place]) 08/02/2015  . Bradycardia 07/27/2015  . Chronic anemia 07/27/2015  . Chest pain   . Vascular dementia 06/13/2015  . Memory loss 06/13/2015  . Left hemiparesis ([place])   . Chronic diastolic congestive heart failure ([place])   . Persistent atrial fibrillation ([place])   . Type 2 diabetes mellitus with  peripheral neuropathy ([place])   . ICH (intracerebral hemorrhage) ([place]) 04/04/2015  . Chronic atrial fibrillation ([place])   . Hypertensive urgency   . SOB (shortness of breath) 04/03/2014  . Epistaxis 02/15/2014  . SBO (small bowel obstruction) ([place]) 01/23/2014  . Diabetic nephropathy ([place]) 09/07/2013  . Chronic constipation 03/25/2013  . Carpal tunnel syndrome 02/25/2013  . Hypothyroidism   . Gout flare 06/01/2012  . Leakage of common bile duct s/p ERCP / stent 05/30/2012  . Ileus post op. still NPO 05/22/2012  . Bile  leak, postoperative 05/17/2012  . Hypokalemia 05/17/2012  . Atrial fibrillation with RVR ([place]) 05/16/2012  . Postoperative ileus 05/16/2012  . Small bowel obstruction due to adhesions ([place]) 05/16/2012  . Acute on chronic renal insufficiency ([place]) 05/15/2012  . Normal coronary arteries, 2008 05/15/2012  . Type II or unspecified type diabetes mellitus with neurological manifestations, not stated as uncontrolled 05/15/2012  . HTN (hypertension) 05/15/2012  . Respiratory distress post op secondary to rapid AF 05/15/2012  . ARF (acute renal failure) ([place]) 05/15/2012  . Abdominal pain, acute, right upper quadrant 05/12/2012  . Nausea and vomiting 05/12/2012  . Dehydration 05/12/2012  . Cholecystitis chronic, acute, S/P lap cholecystectomy 05/14/12 05/12/2012  . Thoracic aortic aneurysm, 4.8cm 2011   . CHF, past NICM with an EF of 30%, most recent echo 2011 showed EF to be45- 50%   . Anemia 08/09/2011  . History of colon cancer, stage III 08/09/2011    Past Surgical History:  Procedure Laterality Date  . CARDIAC CATHETERIZATION  2008   moderate severe pulm HTN; with elevted pulm capillary wedge pressure   . CHOLECYSTECTOMY  05/14/2012   Procedure: LAPAROSCOPIC CHOLECYSTECTOMY WITH INTRAOPERATIVE CHOLANGIOGRAM;  Surgeon: Haywood Lasso, MD;  Location: [facility];  Service: General;  Laterality: N/A;  . COLON SURGERY     to remove colon ca  . ERCP  05/17/2012   Procedure: ENDOSCOPIC RETROGRADE CHOLANGIOPANCREATOGRAPHY (ERCP);  Surgeon: Missy Sabins, MD;  Location: [facility];  Service: Gastroenterology;  Laterality: N/A;  . ERCP  08/12/2012   Procedure: ENDOSCOPIC RETROGRADE CHOLANGIOPANCREATOGRAPHY (ERCP);  Surgeon: Missy Sabins, MD;  Location[facility] ENDOSCOPY;  Service: Endoscopy;  Laterality: N/A;  . HEMICOLECTOMY  08/11/1997  . KIDNEY STONE SURGERY    . TRANSTHORACIC ECHOCARDIOGRAM  11/2009   EF 45-50%, mild-mod AV regurg; mild MR; mod TR; ascending aorta mildly dilated    OB History    No  data available       Home Medications    Prior to Admission medications   Medication Sig Start Date End Date Taking? Authorizing Provider  allopurinol (ZYLOPRIM) 100 MG tablet Take 1 tablet by mouth  daily 04/02/16  Yes [name] [name], DO  aspirin EC 81 MG tablet Take 81 mg by mouth daily.   Yes Historical Provider, MD  butalbital-acetaminophen-caffeine (FIORICET, ESGIC) 50-325-40 MG tablet Take 1 tablet by mouth every 6 (six) hours as needed for headache. 01/08/16  Yes Lavina Hamman, MD  cloNIDine (CATAPRES) 0.2 MG tablet TAKE 1 TABLET BY MOUTH THREE TIMES DAILY FOR HYPERTENSION Patient taking differently: TAKE 1 TABLET BY MOUTH TWO TIMES DAILY FOR HYPERTENSION 04/02/16  Yes [name] [name], DO  donepezil (ARICEPT) 10 MG tablet Take 1 tablet (10 mg total) by mouth at bedtime. 01/09/16  Yes [name] [name], DO  furosemide (LASIX) 20 MG tablet Take 1 tablet by mouth daily, Take 1 extra tablet for more than a 3lb weight gain in a day along with potassium. 02/27/16  Yes [name] [name], DO  hydrALAZINE (APRESOLINE) 50 MG tablet Take 1 tablet (50 mg total) by mouth 4 (four) times daily. Patient taking differently: Take 50 mg by mouth 2 (two) times daily.  03/16/16  Yes Gildardo Cranker, DO  metoprolol (LOPRESSOR) 100 MG tablet Take 1 tablet (100 mg total) by mouth 2 (two) times daily. 01/20/16  Yes [name] [name], DO  potassium chloride (K-DUR,KLOR-CON) 10 MEQ tablet Take 10 mEq by mouth daily. Taking along with lasix   Yes Historical Provider, MD  isosorbide mononitrate (IMDUR) 30 MG 24 hr tablet Take 1 tablet (30 mg total) by mouth daily. Patient not taking: Reported on 04/09/2016 01/20/16   [name], DO  levothyroxine (SYNTHROID, LEVOTHROID) 50 MCG tablet Take one tablet by mouth 30 minutes before breakfast away from other medications for thyroid Patient not taking: Reported on 04/09/2016 01/05/16   [name], DO  Melatonin 5 MG CAPS Take 1 capsule (5 mg total) by mouth at bedtime as needed  (insomnia). Patient not taking: Reported on 04/09/2016 01/20/16   [name] [name], DO  polyethylene glycol (MIRALAX / GLYCOLAX) packet Take 17 g by mouth daily as needed for moderate constipation. Patient not taking: Reported on 04/09/2016 01/20/16   [name] [name], DO  spironolactone (ALDACTONE) 25 MG tablet Take 1 tablet (25 mg total) by mouth daily. Patient not taking: Reported on 04/09/2016 01/20/16   [name], DO    Family History Family History  Problem Relation Age of Onset  . Heart disease Mother   . Stroke Father   . Hypertension Daughter   . Hypertension Son   . Diabetes Sister   . Hypertension Son     Social History Social History  Substance Use Topics  . Smoking status: Never Smoker  . Smokeless tobacco: Never Used  . Alcohol use No     Allergies   Iohexol; Iodinated diagnostic agents; and Z-pak [azithromycin]   Review of Systems Review of Systems  Constitutional: Negative for appetite change, chills and fever.  Respiratory: Positive for shortness of breath.   Cardiovascular: Negative.   Gastrointestinal: Negative.  Negative for abdominal pain, constipation and nausea.  Genitourinary: Negative.  Negative for dysuria.  Musculoskeletal: Negative.  Negative for myalgias.  Skin: Negative.   Neurological: Positive for weakness and headaches. Negative for syncope and speech difficulty.     Physical Exam Updated Vital Signs BP (!) 199/116   Pulse 97   Temp 98.8 F (37.1 C) (Oral)   Resp 20   Ht 5\' 8"  (1.727 m)   Wt 72.6 kg   SpO2 98%   BMI 24.33 kg/m   Physical Exam  Constitutional: She is oriented to person, place, and time. She appears well-developed and well-nourished.  HENT:  Head: Normocephalic.  Neck: Normal range of motion. Neck supple.  Cardiovascular: Normal rate and regular rhythm.   Pulmonary/Chest: Effort normal and breath sounds normal.  Abdominal: Soft. Bowel sounds are normal. There is no tenderness. There is no rebound and no  guarding.  Musculoskeletal: Normal range of motion. She exhibits no edema.  Neurological: She is alert and oriented to person, place, and time.  CN's 3-12 grossly intact. No deficits of coordination. Speech clear, focused and goal oriented.   Skin: Skin is warm and dry. No rash noted.  Psychiatric: She has a normal mood and affect.  Nursing note and vitals reviewed.    ED Treatments / Results  Labs (all labs ordered are listed, but only abnormal  results are displayed) Labs Reviewed  BASIC METABOLIC PANEL - Abnormal; Notable for the following:       Result Value   Glucose, Bld 148 (*)    Creatinine, Ser 1.26 (*)    Calcium 8.8 (*)    GFR calc non Af Amer 38 (*)    GFR calc Af Amer 44 (*)    All other components within normal limits  CBC - Abnormal; Notable for the following:    Hemoglobin 10.4 (*)    HCT 31.6 (*)    MCV 65.8 (*)    MCH 21.7 (*)    RDW 19.5 (*)    All other components within normal limits  I-STAT TROPOININ, ED   Results for orders placed or performed during the hospital encounter of [id number]  Basic metabolic panel  Result Value Ref Range   Sodium 136 135 - 145 mmol/L   Potassium 3.6 3.5 - 5.1 mmol/L   Chloride 104 101 - 111 mmol/L   CO2 26 22 - 32 mmol/L   Glucose, Bld 148 (H) 65 - 99 mg/dL   BUN 17 6 - 20 mg/dL   Creatinine, Ser 1.26 (H) 0.44 - 1.00 mg/dL   Calcium 8.8 (L) 8.9 - 10.3 mg/dL   GFR calc non Af Amer 38 (L) >60 mL/min   GFR calc Af Amer 44 (L) >60 mL/min   Anion gap 6 5 - 15  CBC  Result Value Ref Range   WBC 5.8 4.0 - 10.5 K/uL   RBC 4.80 3.87 - 5.11 MIL/uL   Hemoglobin 10.4 (L) 12.0 - 15.0 g/dL   HCT 31.6 (L) 36.0 - 46.0 %   MCV 65.8 (L) 78.0 - 100.0 fL   MCH 21.7 (L) 26.0 - 34.0 pg   MCHC 32.9 30.0 - 36.0 g/dL   RDW 19.5 (H) 11.5 - 15.5 %   Platelets 370 150 - 400 K/uL  I-stat troponin, ED  Result Value Ref Range   Troponin i, poc 0.01 0.00 - 0.08 ng/mL   Comment 3            EKG  EKG Interpretation None        Radiology Dg Chest 2 View  Result Date: 04/09/2016 CLINICAL DATA:  Atrial fibrillation.  Shortness of breath. EXAM: CHEST  2 VIEW COMPARISON:  02/17/2016 chest radiograph. FINDINGS: Stable cardiomediastinal silhouette with moderate cardiomegaly and aortic atherosclerosis. No pneumothorax. Small right and trace left pleural effusions. Mild pulmonary edema. Mild patchy bibasilar lung opacities. Cholecystectomy clips are seen in the right upper quadrant of the abdomen. IMPRESSION: 1. Stable moderate cardiomegaly, with mild pulmonary edema, consistent with mild congestive heart failure. 2. Small right and trace left pleural effusions . 3. Mild patchy bibasilar lung opacities, favor atelectasis. Electronically Signed   By: Ilona Sorrel M.D.   On: 04/09/2016 17:08    Procedures Procedures (including critical care time)  Medications Ordered in ED Medications - No data to display   Initial Impression / Assessment and Plan / ED Course  I have reviewed the triage vital signs and the nursing notes.  Pertinent labs & imaging results that were available during my care of the patient were reviewed by me and considered in my medical decision making (see chart for details).  Clinical Course    Patient presents with SOB x one week worse with exertion. No CP, vomiting, cough or fever. She is found to be in mild CHF without hypoxia or peripheral edema.   Labs showing elevated BNP to  over 1500. EKG without acute changes.  Blood pressure substantially elevated. She is found to be taking her medications less than prescribed. She has a history of hypertensive urgency with admissions in the past. Feel she needs admission for treatment of CHF and hypertension and to address medication compliance.   Final Clinical Impressions(s) / ED Diagnoses   Final diagnoses:  None    New Prescriptions New Prescriptions   No medications on file     Charlann Lange, PA-C 04/10/16 0300    Noemi Chapel,  MD 04/10/16 0830

## 2016-04-09 NOTE — ED Provider Notes (Signed)
Patient is an 82 year old female, she states that she has been short of breath for what she states is approximately 6 months, she does have a history of atrial fibrillation, she is not on anticoagulants, she does have rate control medications. She reports that she gets short of breath when she gets stressed out or when she thinks about certain things, she denies having shortness of breath or chest pain on exertion or orthopnea or paroxysmal nocturnal dyspnea. She does not have any swelling in her legs. On exam the patient has no peripheral edema, she has no rales, she has no wheezing, normal work of breathing without accessory muscle use. Abdomen is soft, heart rate is irregular in a rhythm of atrial fibrillation without murmurs, no JVD. The patient overall appears comfortable but with severe htn and SOM, BNP pending, anticipate admission if continued hypertension / sOB  I saw and evaluated the patient, reviewed the resident's note and I agree with the findings and plan.   EKG Interpretation  Date/Time:  Monday April 09 2016 16:26:52 EDT Ventricular Rate:  93 PR Interval:    QRS Duration: 104 QT Interval:  420 QTC Calculation: 522 R Axis:   71 Text Interpretation:  Atrial fibrillation Incomplete right bundle branch block ST & T wave abnormality, consider inferior ischemia Prolonged QT Abnormal ECG since last tracing no significant change Confirmed by [place] ([id number]) on 04/09/2016 10:39:33 PM       Final diagnoses:  Acute on chronic systolic congestive heart failure ([place])  Hypertensive urgency       Noemi Chapel, MD 04/10/16 0830

## 2016-04-10 ENCOUNTER — Encounter (HOSPITAL_COMMUNITY): Payer: Self-pay | Admitting: Internal Medicine

## 2016-04-10 DIAGNOSIS — I160 Hypertensive urgency: Secondary | ICD-10-CM | POA: Diagnosis not present

## 2016-04-10 DIAGNOSIS — I5033 Acute on chronic diastolic (congestive) heart failure: Secondary | ICD-10-CM | POA: Diagnosis not present

## 2016-04-10 DIAGNOSIS — E1142 Type 2 diabetes mellitus with diabetic polyneuropathy: Secondary | ICD-10-CM

## 2016-04-10 LAB — TROPONIN I
Troponin I: <0.03 ng/mL (ref <0.03)
Troponin I: <0.03 ng/mL (ref <0.03)
Troponin I: <0.03 ng/mL (ref <0.03)

## 2016-04-10 LAB — BRAIN NATRIURETIC PEPTIDE: B Natriuretic Peptide: 1588.5 pg/mL — ABNORMAL HIGH (ref 0.0–100.0)

## 2016-04-10 LAB — BASIC METABOLIC PANEL
Anion gap: 10 (ref 5–15)
BUN: 15 mg/dL (ref 6–20)
CHLORIDE: 106 mmol/L (ref 101–111)
CO2: 23 mmol/L (ref 22–32)
Calcium: 8.6 mg/dL — ABNORMAL LOW (ref 8.9–10.3)
Creatinine, Ser: 1.1 mg/dL — ABNORMAL HIGH (ref 0.44–1.00)
GFR calc Af Amer: 52 mL/min — ABNORMAL LOW (ref >60)
GFR calc non Af Amer: 45 mL/min — ABNORMAL LOW (ref >60)
GLUCOSE: 129 mg/dL — AB (ref 65–99)
POTASSIUM: 3.3 mmol/L — AB (ref 3.5–5.1)
Sodium: 139 mmol/L (ref 135–145)

## 2016-04-10 LAB — CBC
HEMATOCRIT: 30.5 % — AB (ref 36.0–46.0)
Hemoglobin: 10.1 g/dL — ABNORMAL LOW (ref 12.0–15.0)
MCH: 21.7 pg — ABNORMAL LOW (ref 26.0–34.0)
MCHC: 33.1 g/dL (ref 30.0–36.0)
MCV: 65.6 fL — AB (ref 78.0–100.0)
Platelets: 286 10*3/uL (ref 150–400)
RBC: 4.65 MIL/uL (ref 3.87–5.11)
RDW: 19.4 % — AB (ref 11.5–15.5)
WBC: 6 10*3/uL (ref 4.0–10.5)

## 2016-04-10 MED ORDER — SPIRONOLACTONE 25 MG PO TABS
25.0000 mg | ORAL_TABLET | Freq: Every day | ORAL | Status: DC
  Administered 2016-04-10 – 2016-04-12 (×3): 25 mg via ORAL
  Filled 2016-04-10 (×3): qty 1

## 2016-04-10 MED ORDER — BUTALBITAL-APAP-CAFFEINE 50-325-40 MG PO TABS: 1.0000 | ORAL_TABLET | Freq: Four times a day (QID) | ORAL | Status: DC | PRN

## 2016-04-10 MED ORDER — METOPROLOL TARTRATE 100 MG PO TABS
100.0000 mg | ORAL_TABLET | Freq: Two times a day (BID) | ORAL | Status: DC
  Administered 2016-04-10 – 2016-04-12 (×4): 100 mg via ORAL
  Filled 2016-04-10 (×4): qty 1

## 2016-04-10 MED ORDER — DONEPEZIL HCL 10 MG PO TABS
10.0000 mg | ORAL_TABLET | Freq: Every day | ORAL | Status: DC
  Administered 2016-04-10 – 2016-04-11 (×2): 10 mg via ORAL
  Filled 2016-04-10 (×2): qty 1

## 2016-04-10 MED ORDER — ACETAMINOPHEN 650 MG RE SUPP: 650.0000 mg | Freq: Four times a day (QID) | RECTAL | Status: DC | PRN

## 2016-04-10 MED ORDER — POTASSIUM CHLORIDE CRYS ER 20 MEQ PO TBCR
10.0000 meq | EXTENDED_RELEASE_TABLET | Freq: Every day | ORAL | Status: DC
  Administered 2016-04-10: 10 meq via ORAL
  Filled 2016-04-10: qty 1

## 2016-04-10 MED ORDER — POLYETHYLENE GLYCOL 3350 17 G PO PACK: 17.0000 g | PACK | Freq: Every day | ORAL | Status: DC | PRN

## 2016-04-10 MED ORDER — FUROSEMIDE 10 MG/ML IJ SOLN
40.0000 mg | Freq: Every day | INTRAMUSCULAR | Status: DC
  Administered 2016-04-10 – 2016-04-11 (×2): 40 mg via INTRAVENOUS
  Filled 2016-04-10 (×2): qty 4

## 2016-04-10 MED ORDER — HYDRALAZINE HCL 25 MG PO TABS
50.0000 mg | ORAL_TABLET | Freq: Four times a day (QID) | ORAL | Status: DC
  Administered 2016-04-10: 50 mg via ORAL
  Filled 2016-04-10: qty 2

## 2016-04-10 MED ORDER — ONDANSETRON HCL 4 MG/2ML IJ SOLN: 4.0000 mg | Freq: Four times a day (QID) | INTRAMUSCULAR | Status: DC | PRN

## 2016-04-10 MED ORDER — ALLOPURINOL 100 MG PO TABS
100.0000 mg | ORAL_TABLET | Freq: Every day | ORAL | Status: DC
  Administered 2016-04-10 – 2016-04-12 (×3): 100 mg via ORAL
  Filled 2016-04-10 (×3): qty 1

## 2016-04-10 MED ORDER — ASPIRIN EC 81 MG PO TBEC
81.0000 mg | DELAYED_RELEASE_TABLET | Freq: Every day | ORAL | Status: DC
  Administered 2016-04-10 – 2016-04-12 (×3): 81 mg via ORAL
  Filled 2016-04-10 (×3): qty 1

## 2016-04-10 MED ORDER — POTASSIUM CHLORIDE CRYS ER 20 MEQ PO TBCR
20.0000 meq | EXTENDED_RELEASE_TABLET | Freq: Every day | ORAL | Status: DC
  Administered 2016-04-11 – 2016-04-12 (×2): 20 meq via ORAL
  Filled 2016-04-10 (×2): qty 1

## 2016-04-10 MED ORDER — CLONIDINE HCL 0.2 MG PO TABS
0.2000 mg | ORAL_TABLET | Freq: Three times a day (TID) | ORAL | Status: DC
  Administered 2016-04-10 – 2016-04-12 (×6): 0.2 mg via ORAL
  Filled 2016-04-10 (×7): qty 1

## 2016-04-10 MED ORDER — HYDRALAZINE HCL 50 MG PO TABS
50.0000 mg | ORAL_TABLET | Freq: Three times a day (TID) | ORAL | Status: DC
  Administered 2016-04-10 – 2016-04-12 (×6): 50 mg via ORAL
  Filled 2016-04-10 (×6): qty 1

## 2016-04-10 MED ORDER — LEVOTHYROXINE SODIUM 50 MCG PO TABS
50.0000 ug | ORAL_TABLET | Freq: Every day | ORAL | Status: DC
  Administered 2016-04-10 – 2016-04-12 (×3): 50 ug via ORAL
  Filled 2016-04-10 (×4): qty 1

## 2016-04-10 MED ORDER — METOPROLOL TARTRATE 25 MG PO TABS
100.0000 mg | ORAL_TABLET | Freq: Two times a day (BID) | ORAL | Status: DC
  Administered 2016-04-10: 100 mg via ORAL
  Filled 2016-04-10: qty 4

## 2016-04-10 MED ORDER — POTASSIUM CHLORIDE CRYS ER 20 MEQ PO TBCR
20.0000 meq | EXTENDED_RELEASE_TABLET | Freq: Once | ORAL | Status: AC
  Administered 2016-04-10: 20 meq via ORAL
  Filled 2016-04-10: qty 1

## 2016-04-10 MED ORDER — ISOSORBIDE MONONITRATE ER 30 MG PO TB24
30.0000 mg | ORAL_TABLET | Freq: Every day | ORAL | Status: DC
  Administered 2016-04-10 – 2016-04-12 (×3): 30 mg via ORAL
  Filled 2016-04-10 (×4): qty 1

## 2016-04-10 MED ORDER — ONDANSETRON HCL 4 MG PO TABS: 4.0000 mg | ORAL_TABLET | Freq: Four times a day (QID) | ORAL | Status: DC | PRN

## 2016-04-10 MED ORDER — MELATONIN 3 MG PO TABS
6.0000 mg | ORAL_TABLET | Freq: Every evening | ORAL | Status: DC | PRN
  Filled 2016-04-10: qty 2

## 2016-04-10 MED ORDER — INSULIN ASPART 100 UNIT/ML ~~LOC~~ SOLN: 0.0000 [IU] | Freq: Three times a day (TID) | SUBCUTANEOUS | Status: DC

## 2016-04-10 MED ORDER — FUROSEMIDE 10 MG/ML IJ SOLN
20.0000 mg | Freq: Once | INTRAMUSCULAR | Status: AC
  Administered 2016-04-10: 20 mg via INTRAVENOUS
  Filled 2016-04-10: qty 2

## 2016-04-10 MED ORDER — HYDRALAZINE HCL 20 MG/ML IJ SOLN
10.0000 mg | INTRAMUSCULAR | Status: DC | PRN
  Administered 2016-04-10: 10 mg via INTRAVENOUS
  Filled 2016-04-10: qty 1

## 2016-04-10 MED ORDER — ACETAMINOPHEN 325 MG PO TABS: 650.0000 mg | ORAL_TABLET | Freq: Four times a day (QID) | ORAL | Status: DC | PRN

## 2016-04-10 NOTE — ED Notes (Signed)
Spoke with lab about add on BNP

## 2016-04-10 NOTE — ED Notes (Signed)
Report given to 3E RN

## 2016-04-10 NOTE — Progress Notes (Signed)
PROGRESS NOTE  Nicole [name] [id number] DOB: 08-Jan-1931 DOA: 04/09/2016 PCP: Hollace Kinnier, DO  Brief History:  82 year old female with history of hypertension, diastolic CHF, CKD, atrial fibrillation, hypothyroidism, dementia, colon cancer presented because of one-week history of progressive shortness of breath. The patient is a poor historian secondary to her cognitive impairment. According to the patient's son, [name],  the patient has 24/7 care.   As a result, her caregivers assure that the patient receives her medications appropriately.the patient denies any fevers, chills, chest pain, nausea, vomiting, diarrhea. The patient was able to state that she has been waking up at night having shortness of breath. In addition, the patient had a recent discharge from the hospital on 01/08/2016 during which time her discharge weight was 141 pounds.   Workup in the emergency department revealed a chest x-ray which showed pulmonary edema, with small bilateral pleural effusions. The patient presented with hypertension with blood pressure as high as 213/115. The patient was started on intravenous furosemide with good clinical response.  Assessment/Plan: Acute on chronic diastolic CHF  -The patient last saw Dr. Debara Pickett 12/09/2015 --office weight 152  -Admission weight 160  -01/08/2016 discharge weight 141 pounds  -Suspect exacerbation likely in part due to poorly controlled hypertension -Continue intravenous furosemide -Daily weights -Continue hydralazine, Imdur , Aldactone -repeat echocardiogram -remains clinically fluid overloaded although her moderate TR is likely influencing her +JVD  Hypertension -Continue metoprolol tartrate, hydralazine, Aldactone, clonidine -Poorly controlled -Patient was taking hydralazine 50 mg twice a day  -Increase hydralazine to 50 mg 3 times a day   Chronic atrial fibrillation -CHADSVASc = 6 -not on AC due to intracranial hemorrhage while on  warfarin -continue ASA -rate controlled -personally reviewed EKG--afib, no specific ST-T changes   dementia  -Continue Aricept    hypothyroidism -Continue Synthroid  Diabetes mellitus type 2 -Not on any agents at home -01/19/2016 hemoglobin A1c 6.9 -will not start sliding scale  History of thoracic aortic aneurysm -07/28/2015 CT chest--4.6 cm -Optimize blood pressure control  Hypokalemia -Replete -Check magnesium   Disposition Plan:   Home in 1-2 days  Family Communication:  Son [name] updated on phone--Total time spent 35 minutes.  Greater than 50% spent face to face counseling and coordinating care. 0825--0900  Consultants:  none  Code Status:  FULL   DVT Prophylaxis:  SCDs   Procedures: As Listed in Progress Note Above  Antibiotics: None    Subjective: Patient denies fevers, chills, headache, chest pain, dyspnea, nausea, vomiting, diarrhea, abdominal pain, dysuria, hematuria, hematochezia, and melena.   Objective: Vitals:   04/10/16 0600 04/10/16 0645 04/10/16 0745 04/10/16 0815  BP: (!) 192/120 182/94 (!) 180/101 (!) 199/111  Pulse: (!) 59 89 91 93  Resp: (!) 29 23 24  (!) 29  Temp:      TempSrc:      SpO2: 100% 97% 94% 95%  Weight:      Height:       No intake or output data in the 24 hours ending 04/10/16 0838 Weight change:  Exam:   General:  Pt is alert, follows commands appropriately, not in acute distress  HEENT: No icterus, No thrush, No neck mass, [place]/AT  Cardiovascular: IRRR, S1/S2, no rubs, no gallops  Respiratory: Bilateral crackles, right greater than left.  Abdomen: Soft/+BS, non tender, non distended, no guarding  Extremities: 1 + LE edema, No lymphangitis, No petechiae, No rashes, no synovitis   Data Reviewed: I have personally reviewed  following labs and imaging studies Basic Metabolic Panel:  Recent Labs Lab 04/09/16 1640 04/10/16 0447  NA 136 139  K 3.6 3.3*  CL 104 106  CO2 26 23  GLUCOSE 148* 129*  BUN 17  15  CREATININE 1.26* 1.10*  CALCIUM 8.8* 8.6*   Liver Function Tests: No results for input(s): AST, ALT, ALKPHOS, BILITOT, PROT, ALBUMIN in the last 168 hours. No results for input(s): LIPASE, AMYLASE in the last 168 hours. No results for input(s): AMMONIA in the last 168 hours. Coagulation Profile: No results for input(s): INR, PROTIME in the last 168 hours. CBC:  Recent Labs Lab 04/09/16 1640 04/10/16 0447  WBC 5.8 6.0  HGB 10.4* 10.1*  HCT 31.6* 30.5*  MCV 65.8* 65.6*  PLT 370 286   Cardiac Enzymes:  Recent Labs Lab 04/10/16 0447  TROPONINI <0.03   BNP: Invalid input(s): POCBNP CBG: No results for input(s): GLUCAP in the last 168 hours. HbA1C: No results for input(s): HGBA1C in the last 72 hours. Urine analysis:    Component Value Date/Time   COLORURINE YELLOW 07/28/2015 1050   APPEARANCEUR CLEAR 07/28/2015 1050   LABSPEC 1.010 07/28/2015 1050   PHURINE 7.0 07/28/2015 1050   GLUCOSEU NEGATIVE 07/28/2015 1050   HGBUR NEGATIVE 07/28/2015 1050   BILIRUBINUR NEGATIVE 07/28/2015 1050   KETONESUR NEGATIVE 07/28/2015 1050   PROTEINUR NEGATIVE 07/28/2015 1050   UROBILINOGEN 2.0 (H) 04/13/2015 1844   NITRITE NEGATIVE 07/28/2015 1050   LEUKOCYTESUR TRACE (A) 07/28/2015 1050   Sepsis Labs: @LABRCNTIP (procalcitonin:4,lacticidven:4) )No results found for this or any previous visit (from the past 240 hour(s)).   Scheduled Meds: . allopurinol  100 mg Oral Daily  . aspirin EC  81 mg Oral Daily  . cloNIDine  0.2 mg Oral TID  . donepezil  10 mg Oral QHS  . furosemide  40 mg Intravenous Daily  . hydrALAZINE  50 mg Oral Q6H  . insulin aspart  0-9 Units Subcutaneous TID WC  . isosorbide mononitrate  30 mg Oral Daily  . levothyroxine  50 mcg Oral QAC breakfast  . metoprolol  100 mg Oral BID  . potassium chloride  10 mEq Oral Daily  . spironolactone  25 mg Oral Daily   Continuous Infusions:   Procedures/Studies: Dg Chest 2 View  Result Date: 04/09/2016 CLINICAL  DATA:  Atrial fibrillation.  Shortness of breath. EXAM: CHEST  2 VIEW COMPARISON:  02/17/2016 chest radiograph. FINDINGS: Stable cardiomediastinal silhouette with moderate cardiomegaly and aortic atherosclerosis. No pneumothorax. Small right and trace left pleural effusions. Mild pulmonary edema. Mild patchy bibasilar lung opacities. Cholecystectomy clips are seen in the right upper quadrant of the abdomen. IMPRESSION: 1. Stable moderate cardiomegaly, with mild pulmonary edema, consistent with mild congestive heart failure. 2. Small right and trace left pleural effusions . 3. Mild patchy bibasilar lung opacities, favor atelectasis. Electronically Signed   By: Ilona Sorrel M.D.   On: 04/09/2016 17:08   Ct Head Wo Contrast  Result Date: 03/14/2016 CLINICAL DATA:  Hypertension, temporal headache, blurred vision EXAM: CT HEAD WITHOUT CONTRAST TECHNIQUE: Contiguous axial images were obtained from the base of the skull through the vertex without intravenous contrast. COMPARISON:  07/28/2015 FINDINGS: Brain: No evidence of acute infarction, hemorrhage, hydrocephalus, extra-axial collection or mass lesion/mass effect. Chronic left basal ganglia lacunar infarct (series 2/ image 15). Vascular: No hyperdense vessel or unexpected calcification. Skull: No evidence of calvarial fracture. Sinuses/Orbits: Minimal partial opacification of the left sphenoid sinus. Other: Mild cortical atrophy.  No ventriculomegaly. Subcortical white matter  and periventricular small vessel ischemic changes. Intracranial atherosclerosis. IMPRESSION: No evidence of acute intracranial abnormality. Chronic left basal ganglia lacunar infarct. Atrophy with small vessel ischemic changes. Electronically Signed   By: Julian Hy M.D.   On: 03/14/2016 17:47    Doxie Augenstein, DO  [facility] Pager [phone]  If 7PM-7AM, please contact night-coverage [email or web address] Password [email or web address] 04/10/2016, 8:38 AM   LOS: 0 days

## 2016-04-10 NOTE — ED Notes (Signed)
Ambulated Pt in Hallway with Pulse Ox without O2. Pt started at 97%. While ambulating Patient stayed between 95%-98%. Pt stated she is feeling a lot better and is hoping to go home.

## 2016-04-10 NOTE — ED Notes (Signed)
Pt's recent BP 202/92.  Informed Leanord Hawking, RN.

## 2016-04-10 NOTE — H&P (Addendum)
History and Physical    Nicole [name] [id number] DOB: 02/11/1931 DOA: 04/09/2016  PCP: Hollace Kinnier, DO  Patient coming from: Home.  Chief Complaint: Shortness of breath.  HPI: Nicole [name] is a 82 y.o. female with hypertension, diastolic CHF, chronic kidney disease, atrial fibrillation, hypothyroidism, history of colon cancer, dementia presents to the ER because of increasing shortness of breath. Patient is a poor historian. Patient states she has been short of breath last few days. Mostly on exertion. Denies any chest pain productive cough fever or chills. Was not sure if patient has been compliant with her medications. Patient's blood pressures found to be markedly elevated in the ER. Chest x-ray shows congestion. Patient was given labetalol and clonidine in the ER followed by Lasix IV. Patient is being admitted for acute pulmonary edema with hypertensive urgency.   ED Course: See history of presenting illness.  Review of Systems: As per HPI, rest all negative.   Past Medical History:  Diagnosis Date  . AAA (abdominal aortic aneurysm) ([place])   . Acute upper respiratory infections of unspecified site   . Anemia   . Anxiety   . Aortic aneurysm of unspecified site without mention of rupture   . Arthritis   . Atrial fibrillation ([place])   . Atrial fibrillation ([place])   . Cervicalgia   . CHF (congestive heart failure) ([place])   . Cholelithiasis   . Chronic systolic heart failure ([place])   . CKD (chronic kidney disease)   . Colon cancer ([place]) 07/1997  . Congestive heart failure, unspecified   . Depression   . Diabetes mellitus   . Diabetes mellitus ([place])   . Dizziness and giddiness   . Electrolyte and fluid disorders not elsewhere classified   . GERD (gastroesophageal reflux disease)   . Gout, unspecified   . Hemorrhage of rectum and anus   . Hypercholesteremia   . Hypertension   . Hypoglycemia, unspecified   . Hypopotassemia   . Hypothyroidism   . Kidney stone    renal calculi  . Malignant neoplasm of colon, unspecified site   . Other and unspecified hyperlipidemia   . Other chronic allergic conjunctivitis   . Other specified cardiac dysrhythmias(427.89)   . Pain in joint, lower leg   . Pain in joint, site unspecified   . Palpitations   . Perforation of bile duct   . Proteinuria   . Shortness of breath   . Small bowel obstruction due to adhesions ([place]) 05/16/2012  . Stroke ([place])   . Swelling, mass, or lump in head and neck   . Thoracic aneurysm without mention of rupture     Past Surgical History:  Procedure Laterality Date  . CARDIAC CATHETERIZATION  2008   moderate severe pulm HTN; with elevted pulm capillary wedge pressure   . CHOLECYSTECTOMY  05/14/2012   Procedure: LAPAROSCOPIC CHOLECYSTECTOMY WITH INTRAOPERATIVE CHOLANGIOGRAM;  Surgeon: Haywood Lasso, MD;  Location: [facility];  Service: General;  Laterality: N/A;  . COLON SURGERY     to remove colon ca  . ERCP  05/17/2012   Procedure: ENDOSCOPIC RETROGRADE CHOLANGIOPANCREATOGRAPHY (ERCP);  Surgeon: Missy Sabins, MD;  Location: [place];  Service: Gastroenterology;  Laterality: N/A;  . ERCP  08/12/2012   Procedure: ENDOSCOPIC RETROGRADE CHOLANGIOPANCREATOGRAPHY (ERCP);  Surgeon: Missy Sabins, MD;  Location: [facility] ENDOSCOPY;  Service: Endoscopy;  Laterality: N/A;  . HEMICOLECTOMY  08/11/1997  . KIDNEY STONE SURGERY    . TRANSTHORACIC ECHOCARDIOGRAM  11/2009   EF 45-50%,  mild-mod AV regurg; mild MR; mod TR; ascending aorta mildly dilated     reports that she has never smoked. She has never used smokeless tobacco. She reports that she does not drink alcohol or use drugs.  Allergies  Allergen Reactions  . Iohexol Nausea And Vomiting     Code: VOM, Desc: PT REPORTS VOMITING W/ IVP DYE- ARS 12/26/08, Onset Date: [id number]   . Iodinated Diagnostic Agents Nausea Only    PT HAS HAD SINGULAR INCIDENT OF NAUSEA  W/ IV CONTYRAST INJECTION, NONE IF INJECTED SLOWLY//A.[place]  . Z-Pak [Azithromycin]  Other (See Comments)    Family History  Problem Relation Age of Onset  . Heart disease Mother   . Stroke Father   . Hypertension Daughter   . Hypertension Son   . Diabetes Sister   . Hypertension Son     Prior to Admission medications   Medication Sig Start Date End Date Taking? Authorizing Provider  allopurinol (ZYLOPRIM) 100 MG tablet Take 1 tablet by mouth  daily 04/02/16  Yes [name] [name], DO  aspirin EC 81 MG tablet Take 81 mg by mouth daily.   Yes Historical Provider, MD  butalbital-acetaminophen-caffeine (FIORICET, ESGIC) 50-325-40 MG tablet Take 1 tablet by mouth every 6 (six) hours as needed for headache. 01/08/16  Yes Lavina Hamman, MD  cloNIDine (CATAPRES) 0.2 MG tablet TAKE 1 TABLET BY MOUTH THREE TIMES DAILY FOR HYPERTENSION Patient taking differently: TAKE 1 TABLET BY MOUTH TWO TIMES DAILY FOR HYPERTENSION 04/02/16  Yes [name] [name], DO  donepezil (ARICEPT) 10 MG tablet Take 1 tablet (10 mg total) by mouth at bedtime. 01/09/16  Yes [name] [name], DO  furosemide (LASIX) 20 MG tablet Take 1 tablet by mouth daily, Take 1 extra tablet for more than a 3lb weight gain in a day along with potassium. 02/27/16  Yes [name] [name], DO  hydrALAZINE (APRESOLINE) 50 MG tablet Take 1 tablet (50 mg total) by mouth 4 (four) times daily. Patient taking differently: Take 50 mg by mouth 2 (two) times daily.  03/16/16  Yes Gildardo Cranker, DO  metoprolol (LOPRESSOR) 100 MG tablet Take 1 tablet (100 mg total) by mouth 2 (two) times daily. 01/20/16  Yes [name] [name], DO  potassium chloride (K-DUR,KLOR-CON) 10 MEQ tablet Take 10 mEq by mouth daily. Taking along with lasix   Yes Historical Provider, MD  isosorbide mononitrate (IMDUR) 30 MG 24 hr tablet Take 1 tablet (30 mg total) by mouth daily. Patient not taking: Reported on 04/09/2016 01/20/16   [name], DO  levothyroxine (SYNTHROID, LEVOTHROID) 50 MCG tablet Take one tablet by mouth 30 minutes before breakfast away from other medications for  thyroid Patient not taking: Reported on 04/09/2016 01/05/16   [name], DO  Melatonin 5 MG CAPS Take 1 capsule (5 mg total) by mouth at bedtime as needed (insomnia). Patient not taking: Reported on 04/09/2016 01/20/16   [name] [name], DO  polyethylene glycol (MIRALAX / GLYCOLAX) packet Take 17 g by mouth daily as needed for moderate constipation. Patient not taking: Reported on 04/09/2016 01/20/16   [name] [name], DO  spironolactone (ALDACTONE) 25 MG tablet Take 1 tablet (25 mg total) by mouth daily. Patient not taking: Reported on 04/09/2016 01/20/16   [name], DO    Physical Exam: Vitals:   04/10/16 0200 04/10/16 0215 04/10/16 0245 04/10/16 0315  BP: (!) 206/92 189/89 (!) 203/119 (!) 176/108  Pulse: 91 (!) 129 83 89  Resp: 26 Marland Kitchen)  32 (!) 29 (!) 30  Temp:      TempSrc:      SpO2: 95% 99% 98% 98%  Weight:      Height:          Constitutional: Not in distress. Vitals:   04/10/16 0200 04/10/16 0215 04/10/16 0245 04/10/16 0315  BP: (!) 206/92 189/89 (!) 203/119 (!) 176/108  Pulse: 91 (!) 129 83 89  Resp: 26 (!) 32 (!) 29 (!) 30  Temp:      TempSrc:      SpO2: 95% 99% 98% 98%  Weight:      Height:       Eyes: Anicteric no pallor. ENMT: No discharge from the ears eyes nose or mouth. Neck: JVD is elevated. No mass felt. Respiratory: No rhonchi or crepitations. Cardiovascular: S1 and S2 heard. Abdomen: Soft nontender bowel sounds present. Musculoskeletal: No edema. Skin: No rash. Neurologic: Alert awake oriented to time place. Moves all extremities. Psychiatric: Appears normal.   Labs on Admission: I have personally reviewed following labs and imaging studies  CBC:  Recent Labs Lab 04/09/16 1640  WBC 5.8  HGB 10.4*  HCT 31.6*  MCV 65.8*  PLT 0000000   Basic Metabolic Panel:  Recent Labs Lab 04/09/16 1640  NA 136  K 3.6  CL 104  CO2 26  GLUCOSE 148*  BUN 17  CREATININE 1.26*  CALCIUM 8.8*   GFR: Estimated Creatinine Clearance: 34.1 mL/min (by C-G  formula based on SCr of 1.26 mg/dL (H)). Liver Function Tests: No results for input(s): AST, ALT, ALKPHOS, BILITOT, PROT, ALBUMIN in the last 168 hours. No results for input(s): LIPASE, AMYLASE in the last 168 hours. No results for input(s): AMMONIA in the last 168 hours. Coagulation Profile: No results for input(s): INR, PROTIME in the last 168 hours. Cardiac Enzymes: No results for input(s): CKTOTAL, CKMB, CKMBINDEX, TROPONINI in the last 168 hours. BNP (last 3 results) No results for input(s): PROBNP in the last 8760 hours. HbA1C: No results for input(s): HGBA1C in the last 72 hours. CBG: No results for input(s): GLUCAP in the last 168 hours. Lipid Profile: No results for input(s): CHOL, HDL, LDLCALC, TRIG, CHOLHDL, LDLDIRECT in the last 72 hours. Thyroid Function Tests: No results for input(s): TSH, T4TOTAL, FREET4, T3FREE, THYROIDAB in the last 72 hours. Anemia Panel: No results for input(s): VITAMINB12, FOLATE, FERRITIN, TIBC, IRON, RETICCTPCT in the last 72 hours. Urine analysis:    Component Value Date/Time   COLORURINE YELLOW 07/28/2015 1050   APPEARANCEUR CLEAR 07/28/2015 1050   LABSPEC 1.010 07/28/2015 1050   PHURINE 7.0 07/28/2015 1050   GLUCOSEU NEGATIVE 07/28/2015 1050   HGBUR NEGATIVE 07/28/2015 1050   BILIRUBINUR NEGATIVE 07/28/2015 1050   KETONESUR NEGATIVE 07/28/2015 1050   PROTEINUR NEGATIVE 07/28/2015 1050   UROBILINOGEN 2.0 (H) 04/13/2015 1844   NITRITE NEGATIVE 07/28/2015 1050   LEUKOCYTESUR TRACE (A) 07/28/2015 1050   Sepsis Labs: @LABRCNTIP (procalcitonin:4,lacticidven:4) )No results found for this or any previous visit (from the past 240 hour(s)).   Radiological Exams on Admission: Dg Chest 2 View  Result Date: 04/09/2016 CLINICAL DATA:  Atrial fibrillation.  Shortness of breath. EXAM: CHEST  2 VIEW COMPARISON:  02/17/2016 chest radiograph. FINDINGS: Stable cardiomediastinal silhouette with moderate cardiomegaly and aortic atherosclerosis. No  pneumothorax. Small right and trace left pleural effusions. Mild pulmonary edema. Mild patchy bibasilar lung opacities. Cholecystectomy clips are seen in the right upper quadrant of the abdomen. IMPRESSION: 1. Stable moderate cardiomegaly, with mild pulmonary edema, consistent with mild congestive  heart failure. 2. Small right and trace left pleural effusions . 3. Mild patchy bibasilar lung opacities, favor atelectasis. Electronically Signed   By: Ilona Sorrel M.D.   On: 04/09/2016 17:08    EKG: Independently reviewed. A. fib with rate control around 90 bpm. Nonspecific ST-T changes.  Assessment/Plan Principal Problem:   Hypertensive urgency Active Problems:   History of colon cancer, stage III   Thoracic aortic aneurysm, 4.8cm 2011   Type 2 diabetes mellitus with peripheral neuropathy (HCC)   Vascular dementia   Acute on chronic diastolic heart failure ([place])   Hypothyroidism due to acquired atrophy of thyroid   Atrial fibrillation ([place])    1. Acute pulmonary edema with hypertensive urgency -  I have increased patient's home dose of clonidine from twice a day to 3 times a day and continued on the original dose of hydralazine patient is prescribed. In addition I placed patient on when necessary IV hydralazine. Continue Lasix IV Imdur and spironolactone. Patient has gained at least 15 pounds from last month. Last 2-D echo done in January 2017 was showing EF of 55-60%. Closely follow intake output metabolic panel and daily weights. 2. Chronic atrial fibrillation - not a candidate for anticoagulation secondary to history of bleed. 3. Dementia on Aricept. 4. Diabetes mellitus type 2 - on sliding scale coverage. 5. History of ascending aortic aneurysm - denies any chest pain. 6. Hypothyroidism on Synthroid. 7. Chronic kidney disease stage III - creatinine appears to be at baseline. 8. Chronic anemia - follow CBC.   DVT prophylaxis: SCDs. Change to Lovenox once patient's blood pressure  improves. Code Status: DO NOT RESUSCITATE.  Family Communication: Patient's husband.  Disposition Plan: Home.  Consults called: None.  Admission status: Observation.    Rise Patience MD [facility] Pager [phone].  If 7PM-7AM, please contact night-coverage [email or web address] Password [facility]  04/10/2016, 3:38 AM

## 2016-04-10 NOTE — Care Management Obs Status (Signed)
[facility] NOTIFICATION   Patient Details  Name: Nicole [name] MRN: [id number] Date of Birth: 02/15/1931   Medicare Observation Status Notification Given:  Yes (Obs letter explained to patient and son, answered son's questions)    Apolonio Schneiders, RN 04/10/2016, 6:25 PM

## 2016-04-10 NOTE — ED Notes (Signed)
Attempted report x1. 

## 2016-04-11 ENCOUNTER — Observation Stay (HOSPITAL_BASED_OUTPATIENT_CLINIC_OR_DEPARTMENT_OTHER): Payer: Medicare Other

## 2016-04-11 ENCOUNTER — Observation Stay (HOSPITAL_COMMUNITY): Payer: Medicare Other

## 2016-04-11 DIAGNOSIS — I509 Heart failure, unspecified: Secondary | ICD-10-CM

## 2016-04-11 DIAGNOSIS — I482 Chronic atrial fibrillation: Secondary | ICD-10-CM

## 2016-04-11 DIAGNOSIS — I160 Hypertensive urgency: Secondary | ICD-10-CM

## 2016-04-11 DIAGNOSIS — E034 Atrophy of thyroid (acquired): Secondary | ICD-10-CM

## 2016-04-11 DIAGNOSIS — I5033 Acute on chronic diastolic (congestive) heart failure: Secondary | ICD-10-CM | POA: Diagnosis not present

## 2016-04-11 DIAGNOSIS — Z85038 Personal history of other malignant neoplasm of large intestine: Secondary | ICD-10-CM | POA: Diagnosis not present

## 2016-04-11 DIAGNOSIS — E038 Other specified hypothyroidism: Secondary | ICD-10-CM

## 2016-04-11 LAB — BASIC METABOLIC PANEL
ANION GAP: 6 (ref 5–15)
BUN: 15 mg/dL (ref 6–20)
CALCIUM: 8.3 mg/dL — AB (ref 8.9–10.3)
CO2: 29 mmol/L (ref 22–32)
CREATININE: 1.28 mg/dL — AB (ref 0.44–1.00)
Chloride: 104 mmol/L (ref 101–111)
GFR, EST AFRICAN AMERICAN: 44 mL/min — AB (ref >60)
GFR, EST NON AFRICAN AMERICAN: 38 mL/min — AB (ref >60)
GLUCOSE: 128 mg/dL — AB (ref 65–99)
Potassium: 3.8 mmol/L (ref 3.5–5.1)
Sodium: 139 mmol/L (ref 135–145)

## 2016-04-11 LAB — ECHOCARDIOGRAM COMPLETE
HEIGHTINCHES: 68 in
WEIGHTICAEL: 2259.2 [oz_av]

## 2016-04-11 LAB — MAGNESIUM: Magnesium: 1.4 mg/dL — ABNORMAL LOW (ref 1.7–2.4)

## 2016-04-11 MED ORDER — MAGNESIUM SULFATE 2 GM/50ML IV SOLN
2.0000 g | Freq: Once | INTRAVENOUS | Status: AC
Start: 2016-04-11 — End: 2016-04-11
  Administered 2016-04-11: 2 g via INTRAVENOUS
  Filled 2016-04-11: qty 50

## 2016-04-11 MED ORDER — FUROSEMIDE 20 MG PO TABS
20.0000 mg | ORAL_TABLET | Freq: Every day | ORAL | Status: DC
  Administered 2016-04-12: 20 mg via ORAL
  Filled 2016-04-11: qty 1

## 2016-04-11 NOTE — Progress Notes (Signed)
PROGRESS NOTE  Nicole [name] [id number] DOB: 10-28-1930 DOA: 04/09/2016 PCP: Hollace Kinnier, DO  Brief History:  82 year old female with history of hypertension, diastolic CHF, CKD, atrial fibrillation, hypothyroidism, dementia, colon cancer presented because of one-week history of progressive shortness of breath. The patient is a poor historian secondary to her cognitive impairment. According to the patient's son, [name],  the patient has 24/7 care.   As a result, her caregivers assure that the patient receives her medications appropriately.the patient denies any fevers, chills, chest pain, nausea, vomiting, diarrhea. The patient was able to state that she has been waking up at night having shortness of breath. In addition, the patient had a recent discharge from the hospital on 01/08/2016 during which time her discharge weight was 141 pounds.   Workup in the emergency department revealed a chest x-ray which showed pulmonary edema, with small bilateral pleural effusions. The patient presented with hypertension with blood pressure as high as 213/115. The patient was started on intravenous furosemide with good clinical response.  Assessment/Plan: Acute on chronic diastolic CHF  - The patient last saw Dr. Debara Pickett 12/09/2015 --office weight 152  - Admission weight 160, >> 141 after IV Furosemide, transition to po starting tomorrow  - 01/08/2016 discharge weight 141 pounds  - Suspect exacerbation likely in part due to poorly controlled hypertension - Daily weights - Continue hydralazine, Imdur , Aldactone - repeat echocardiogram with normal EF, LVH, as below  Hypertension - Continue metoprolol tartrate, hydralazine, Aldactone, clonidine - Poorly controlled - Patient was taking hydralazine 50 mg twice a day  - Increase hydralazine to 50 mg 3 times a day, BP better today  Chronic atrial fibrillation - CHADSVASc = 6 - not on AC due to intracranial hemorrhage while on warfarin -  continue ASA - rate controlled  Dementia  - Continue Aricept   Hypothyroidism - Continue Synthroid  Diabetes mellitus type 2 - Not on any agents at home - 01/19/2016 hemoglobin A1c 6.9 - will not start sliding scale  History of thoracic aortic aneurysm - 07/28/2015 CT chest--4.6 cm - Optimize blood pressure control  Hypokalemia / hypomagnesemia - Replete Mg today, low due to lasix   Disposition Plan: Home in 1-2 days  Family Communication: Son [name] updated on phone Consultants: none  Code Status:  FULL   DVT Prophylaxis:  SCDs   Procedures: 2D echo  Study Conclusions - Left ventricle: The cavity size was normal. Wall thickness was increased in a pattern of moderate LVH. Systolic function was normal. The estimated ejection fraction was in the range of 60% to 65%. Wall motion was normal; there were no regional wall motion abnormalities. - Aortic valve: Mildly to moderately calcified annulus. There was moderate regurgitation. - Mitral valve: There was mild regurgitation. - Left atrium: The atrium was moderately dilated. - Right atrium: The atrium was moderately dilated. - Tricuspid valve: There was moderate regurgitation. - Pulmonic valve: There was moderate regurgitation. - Pulmonary arteries: Systolic pressure was moderately increased. PA peak pressure: 51 mm Hg (S).  Antibiotics: None  Subjective: - no chest pain, shortness of breath, no abdominal pain, nausea or vomiting.   Objective: Vitals:   04/10/16 2144 04/11/16 0025 04/11/16 0620 04/11/16 0944  BP: (!) 173/83 138/73 (!) 177/81 135/90  Pulse: 67 76 66 80  Resp: 20 20 20 18   Temp: 98.4 F (36.9 C) 98.5 F (36.9 C) 98.4 F (36.9 C) 98.3 F (36.8 C)  TempSrc: Oral Oral  Oral Oral  SpO2: 95% 97% 95% 97%  Weight:   64 kg (141 lb 3.2 oz)   Height:        Intake/Output Summary (Last 24 hours) at 04/11/16 1204 Last data filed at 04/11/16 1005  Gross per 24 hour  Intake              595 ml  Output              1550 ml  Net             -955 ml   Weight change: -8.392 kg (-18 lb 8 oz) Exam:  General:  Pt is alert, follows commands appropriately, not in acute distress  HEENT: No icterus, No thrush, No neck mass, [place]/AT  Cardiovascular: IRRR, S1/S2, no rubs, no gallops  Respiratory: Bilateral crackles, right greater than left.  Abdomen: Soft/+BS, non tender, non distended, no guarding  Extremities: trace LE edema,    Data Reviewed: I have personally reviewed following labs and imaging studies Basic Metabolic Panel:  Recent Labs Lab 04/09/16 1640 04/10/16 0447 04/11/16 1002  NA 136 139 139  K 3.6 3.3* 3.8  CL 104 106 104  CO2 26 23 29   GLUCOSE 148* 129* 128*  BUN 17 15 15   CREATININE 1.26* 1.10* 1.28*  CALCIUM 8.8* 8.6* 8.3*  MG  --   --  1.4*   Liver Function Tests: No results for input(s): AST, ALT, ALKPHOS, BILITOT, PROT, ALBUMIN in the last 168 hours. No results for input(s): LIPASE, AMYLASE in the last 168 hours. No results for input(s): AMMONIA in the last 168 hours. Coagulation Profile: No results for input(s): INR, PROTIME in the last 168 hours. CBC:  Recent Labs Lab 04/09/16 1640 04/10/16 0447  WBC 5.8 6.0  HGB 10.4* 10.1*  HCT 31.6* 30.5*  MCV 65.8* 65.6*  PLT 370 286   Cardiac Enzymes:  Recent Labs Lab 04/10/16 0447 04/10/16 0948 04/10/16 1928  TROPONINI <0.03 <0.03 <0.03   BNP: Invalid input(s): POCBNP CBG: No results for input(s): GLUCAP in the last 168 hours. HbA1C: No results for input(s): HGBA1C in the last 72 hours. Urine analysis:    Component Value Date/Time   COLORURINE YELLOW 07/28/2015 1050   APPEARANCEUR CLEAR 07/28/2015 1050   LABSPEC 1.010 07/28/2015 1050   PHURINE 7.0 07/28/2015 1050   GLUCOSEU NEGATIVE 07/28/2015 1050   HGBUR NEGATIVE 07/28/2015 1050   BILIRUBINUR NEGATIVE 07/28/2015 1050   KETONESUR NEGATIVE 07/28/2015 1050   PROTEINUR NEGATIVE 07/28/2015 1050   UROBILINOGEN 2.0 (H) 04/13/2015 1844   NITRITE  NEGATIVE 07/28/2015 1050   LEUKOCYTESUR TRACE (A) 07/28/2015 1050   Sepsis Labs: @LABRCNTIP (procalcitonin:4,lacticidven:4) )No results found for this or any previous visit (from the past 240 hour(s)).   Scheduled Meds: . allopurinol  100 mg Oral Daily  . aspirin EC  81 mg Oral Daily  . cloNIDine  0.2 mg Oral TID  . donepezil  10 mg Oral QHS  . furosemide  40 mg Intravenous Daily  . hydrALAZINE  50 mg Oral Q8H  . isosorbide mononitrate  30 mg Oral Daily  . levothyroxine  50 mcg Oral QAC breakfast  . metoprolol tartrate  100 mg Oral BID  . potassium chloride  20 mEq Oral Daily  . spironolactone  25 mg Oral Daily   Continuous Infusions:   Procedures/Studies: Dg Chest 2 View  Result Date: 04/09/2016 CLINICAL DATA:  Atrial fibrillation.  Shortness of breath. EXAM: CHEST  2 VIEW COMPARISON:  02/17/2016 chest radiograph. FINDINGS:  Stable cardiomediastinal silhouette with moderate cardiomegaly and aortic atherosclerosis. No pneumothorax. Small right and trace left pleural effusions. Mild pulmonary edema. Mild patchy bibasilar lung opacities. Cholecystectomy clips are seen in the right upper quadrant of the abdomen. IMPRESSION: 1. Stable moderate cardiomegaly, with mild pulmonary edema, consistent with mild congestive heart failure. 2. Small right and trace left pleural effusions . 3. Mild patchy bibasilar lung opacities, favor atelectasis. Electronically Signed   By: Ilona Sorrel M.D.   On: 04/09/2016 17:08   Ct Head Wo Contrast  Result Date: 03/14/2016 CLINICAL DATA:  Hypertension, temporal headache, blurred vision EXAM: CT HEAD WITHOUT CONTRAST TECHNIQUE: Contiguous axial images were obtained from the base of the skull through the vertex without intravenous contrast. COMPARISON:  07/28/2015 FINDINGS: Brain: No evidence of acute infarction, hemorrhage, hydrocephalus, extra-axial collection or mass lesion/mass effect. Chronic left basal ganglia lacunar infarct (series 2/ image 15). Vascular: No  hyperdense vessel or unexpected calcification. Skull: No evidence of calvarial fracture. Sinuses/Orbits: Minimal partial opacification of the left sphenoid sinus. Other: Mild cortical atrophy.  No ventriculomegaly. Subcortical white matter and periventricular small vessel ischemic changes. Intracranial atherosclerosis. IMPRESSION: No evidence of acute intracranial abnormality. Chronic left basal ganglia lacunar infarct. Atrophy with small vessel ischemic changes. Electronically Signed   By: Julian Hy M.D.   On: 03/14/2016 17:47    Marzetta Board, MD  [facility] Pager [phone]  If 7PM-7AM, please contact night-coverage [email or web address] Password [email or web address] 04/11/2016, 12:04 PM   LOS: 0 days

## 2016-04-11 NOTE — Progress Notes (Signed)
  Echocardiogram 2D Echocardiogram has been performed.  Nicole [name] 04/11/2016, 9:37 AM

## 2016-04-12 DIAGNOSIS — I160 Hypertensive urgency: Secondary | ICD-10-CM | POA: Diagnosis not present

## 2016-04-12 MED ORDER — HYDRALAZINE HCL 50 MG PO TABS: 50.0000 mg | ORAL_TABLET | Freq: Three times a day (TID) | ORAL | 3 refills | Status: DC

## 2016-04-12 NOTE — Discharge Instructions (Signed)
Follow with [name], [name], DO in 1-2 weeks  Please get a complete blood count and chemistry panel checked by your Primary MD at your next visit, and again as instructed by your Primary MD. Please get your medications reviewed and adjusted by your Primary MD.  Please request your Primary MD to go over all Hospital Tests and Procedure/Radiological results at the follow up, please get all Hospital records sent to your Prim MD by signing hospital release before you go home.  If you had Pneumonia of Lung problems at the Hospital: Please get a 2 view Chest X ray done in 6-8 weeks after hospital discharge or sooner if instructed by your Primary MD.  If you have Congestive Heart Failure: Please call your Cardiologist or Primary MD anytime you have any of the following symptoms:  1) 3 pound weight gain in 24 hours or 5 pounds in 1 week  2) shortness of breath, with or without a dry hacking cough  3) swelling in the hands, feet or stomach  4) if you have to sleep on extra pillows at night in order to breathe  Follow cardiac low salt diet and 1.5 lit/day fluid restriction.  If you have diabetes Accuchecks 4 times/day, Once in AM empty stomach and then before each meal. Log in all results and show them to your primary doctor at your next visit. If any glucose reading is under 80 or above 300 call your primary MD immediately.  If you have Seizure/Convulsions/Epilepsy: Please do not drive, operate heavy machinery, participate in activities at heights or participate in high speed sports until you have seen by Primary MD or a Neurologist and advised to do so again.  If you had Gastrointestinal Bleeding: Please ask your Primary MD to check a complete blood count within one week of discharge or at your next visit. Your endoscopic/colonoscopic biopsies that are pending at the time of discharge, will also need to followed by your Primary MD.  Get Medicines reviewed and adjusted. Please take all your  medications with you for your next visit with your Primary MD  Please request your Primary MD to go over all hospital tests and procedure/radiological results at the follow up, please ask your Primary MD to get all Hospital records sent to his/her office.  If you experience worsening of your admission symptoms, develop shortness of breath, life threatening emergency, suicidal or homicidal thoughts you must seek medical attention immediately by calling 911 or calling your MD immediately  if symptoms less severe.  You must read complete instructions/literature along with all the possible adverse reactions/side effects for all the Medicines you take and that have been prescribed to you. Take any new Medicines after you have completely understood and accpet all the possible adverse reactions/side effects.   Do not drive or operate heavy machinery when taking Pain medications.   Do not take more than prescribed Pain, Sleep and Anxiety Medications  Special Instructions: If you have smoked or chewed Tobacco  in the last 2 yrs please stop smoking, stop any regular Alcohol  and or any Recreational drug use.  Wear Seat belts while driving.  Please note You were cared for by a hospitalist during your hospital stay. If you have any questions about your discharge medications or the care you received while you were in the hospital after you are discharged, you can call the unit and asked to speak with the hospitalist on call if the hospitalist that took care of you is not available. Once  you are discharged, your primary care physician will handle any further medical issues. Please note that NO REFILLS for any discharge medications will be authorized once you are discharged, as it is imperative that you return to your primary care physician (or establish a relationship with a primary care physician if you do not have one) for your aftercare needs so that they can reassess your need for medications and monitor your  lab values.  You can reach the hospitalist office at phone [phone] or fax [phone]   If you do not have a primary care physician, you can call [phone] for a physician referral.  Activity: As tolerated with Full fall precautions use walker/cane & assistance as needed  Diet: heart healthy  Disposition Home

## 2016-04-12 NOTE — Progress Notes (Signed)
Pt has orders to be discharged. Discharge instructions given and pt has no additional questions at this time. Medication regimen reviewed and pt educated. Pt verbalized understanding and has no additional questions. Telemetry box removed. IV removed and site in good condition. Pt stable and waiting for transportation. 

## 2016-04-12 NOTE — Discharge Summary (Signed)
Physician Discharge Summary  Nicole [name] [id number] DOB: September 06, 1930 DOA: 04/09/2016  PCP: Hollace Kinnier, DO  Admit date: 04/09/2016 Discharge date: 04/12/2016  Admitted From: home Disposition:  home  Recommendations for Outpatient Follow-up:  1. Follow up with PCP in 1-2 weeks 2. Follow up with cardiology as scheduled in 1 week  Home Health: none Equipment/Devices: none   Discharge Condition: stable CODE STATUS: DNR Diet recommendation: heart healthy  HPI: Nicole [name] is a 82 y.o. female with hypertension, diastolic CHF, chronic kidney disease, atrial fibrillation, hypothyroidism, history of colon cancer, dementia presents to the ER because of increasing shortness of breath. Patient is a poor historian. Patient states she has been short of breath last few days. Mostly on exertion. Denies any chest pain productive cough fever or chills. Was not sure if patient has been compliant with her medications. Patient's blood pressures found to be markedly elevated in the ER. Chest x-ray shows congestion. Patient was given labetalol and clonidine in the ER followed by Lasix IV. Patient is being admitted for acute pulmonary edema with hypertensive urgency.   Hospital Course: Discharge Diagnoses:  Principal Problem:   Hypertensive urgency Active Problems:   History of colon cancer, stage III   Thoracic aortic aneurysm, 4.8cm 2011   Type 2 diabetes mellitus with peripheral neuropathy (HCC)   Vascular dementia   Acute on chronic diastolic heart failure (HCC)   Hypothyroidism due to acquired atrophy of thyroid   Atrial fibrillation (HCC)  Acute on chronic diastolic CHF  - The patient last saw Dr. Debara Pickett 12/09/2015 --office weight 152  - Admission weight 160, received IV Lasix with significant improvement in her breathing, weight down 139 on discharge. Renal function stable with diuresis, will resume po Lasix on d/c. It appears that this is likely due to dietary non compliance,  discussed with family bedside regarding appropriate salt intake. Poorly controlled hypertension also contributing. Repeat echocardiogram with normal EF, LVH Hypertension - Continue metoprolol tartrate, hydralazine, Aldactone, clonidine. Increased hydralazine to 50 mg 3 times a day, patient was taking it twice daily at home Chronic atrial fibrillation - CHADSVASc = 6, not on AC due to intracranial hemorrhage while on warfarin Dementia - Continue Aricept  Hypothyroidism - Continue Synthroid Diabetes mellitus type 2 - Not on any agents at home, 01/19/2016 hemoglobin A1c 6.9 History of thoracic aortic aneurysm - 07/28/2015 CT chest--4.6 cm, optimize blood pressure control Hypokalemia / hypomagnesemia - Repleted  Discharge Instructions     Medication List    TAKE these medications   allopurinol 100 MG tablet Commonly known as:  ZYLOPRIM Take 1 tablet by mouth  daily   aspirin EC 81 MG tablet Take 81 mg by mouth daily.   butalbital-acetaminophen-caffeine 50-325-40 MG tablet Commonly known as:  FIORICET, ESGIC Take 1 tablet by mouth every 6 (six) hours as needed for headache.   cloNIDine 0.2 MG tablet Commonly known as:  CATAPRES TAKE 1 TABLET BY MOUTH THREE TIMES DAILY FOR HYPERTENSION What changed:  See the new instructions.   donepezil 10 MG tablet Commonly known as:  ARICEPT Take 1 tablet (10 mg total) by mouth at bedtime.   furosemide 20 MG tablet Commonly known as:  LASIX Take 1 tablet by mouth daily, Take 1 extra tablet for more than a 3lb weight gain in a day along with potassium.   hydrALAZINE 50 MG tablet Commonly known as:  APRESOLINE Take 1 tablet (50 mg total) by mouth 3 (three) times daily. What changed:  when to  take this   isosorbide mononitrate 30 MG 24 hr tablet Commonly known as:  IMDUR Take 1 tablet (30 mg total) by mouth daily.   levothyroxine 50 MCG tablet Commonly known as:  SYNTHROID, LEVOTHROID Take one tablet by mouth 30 minutes before breakfast  away from other medications for thyroid   Melatonin 5 MG Caps Take 1 capsule (5 mg total) by mouth at bedtime as needed (insomnia).   metoprolol 100 MG tablet Commonly known as:  LOPRESSOR Take 1 tablet (100 mg total) by mouth 2 (two) times daily.   polyethylene glycol packet Commonly known as:  MIRALAX / GLYCOLAX Take 17 g by mouth daily as needed for moderate constipation.   potassium chloride 10 MEQ tablet Commonly known as:  K-DUR,KLOR-CON Take 10 mEq by mouth daily. Taking along with lasix   spironolactone 25 MG tablet Commonly known as:  ALDACTONE Take 1 tablet (25 mg total) by mouth daily.      Follow-up Information    Pixie Casino, MD On 04/19/2016.   Specialty:  Cardiology Why:  At 8:00am with Calton Golds for hospital follow up  Contact information: [address] [place] [address] [phone]          Allergies  Allergen Reactions  . Iohexol Nausea And Vomiting     Code: VOM, Desc: PT REPORTS VOMITING W/ IVP DYE- ARS 12/26/08, Onset Date: [id number]   . Iodinated Diagnostic Agents Nausea Only    PT HAS HAD SINGULAR INCIDENT OF NAUSEA  W/ IV CONTYRAST INJECTION, NONE IF INJECTED SLOWLY//A.CALHOUN  . Z-Pak [Azithromycin] Other (See Comments)    Consultations:  None   Procedures/Studies:  2D echo  Study Conclusions - Left ventricle: The cavity size was normal. Wall thickness wasincreased in a pattern of moderate LVH. Systolic function wasnormal. The estimated ejection fraction was in the range of 60%to 65%. Wall motion was normal; there were no regional wallmotion abnormalities. - Aortic valve: Mildly to moderately calcified annulus. There wasmoderate regurgitation. - Mitral valve: There was mild regurgitation. - Left atrium: The atrium was moderately dilated. - Right atrium: The atrium was moderately dilated. - Tricuspid valve: There was moderate regurgitation. - Pulmonic valve: There was moderate regurgitation. - Pulmonary  arteries: Systolic pressure was moderately increased.PA peak pressure: 51 mm Hg (S).  Dg Chest 2 View  Result Date: 04/11/2016 CLINICAL DATA:  CHF EXAM: CHEST  2 VIEW COMPARISON:  04/09/2016 FINDINGS: Cardiac enlargement with vascular congestion. No edema. Pulmonary artery enlargement compatible with pulmonary artery hypertension is unchanged. Small bilateral pleural effusions, with improvement of the right pleural effusion. Negative for pneumonia. IMPRESSION: Pulmonary vascular congestion with small pleural effusions suggesting mild fluid overload. Negative for edema. Improvement in right pleural effusion since prior study. Pulmonary artery hypertension. Electronically Signed   By: Franchot Gallo M.D.   On: 04/11/2016 13:04   Dg Chest 2 View  Result Date: 04/09/2016 CLINICAL DATA:  Atrial fibrillation.  Shortness of breath. EXAM: CHEST  2 VIEW COMPARISON:  02/17/2016 chest radiograph. FINDINGS: Stable cardiomediastinal silhouette with moderate cardiomegaly and aortic atherosclerosis. No pneumothorax. Small right and trace left pleural effusions. Mild pulmonary edema. Mild patchy bibasilar lung opacities. Cholecystectomy clips are seen in the right upper quadrant of the abdomen. IMPRESSION: 1. Stable moderate cardiomegaly, with mild pulmonary edema, consistent with mild congestive heart failure. 2. Small right and trace left pleural effusions . 3. Mild patchy bibasilar lung opacities, favor atelectasis. Electronically Signed   By: Ilona Sorrel M.D.   On: 04/09/2016  17:08   Ct Head Wo Contrast  Result Date: 03/14/2016 CLINICAL DATA:  Hypertension, temporal headache, blurred vision EXAM: CT HEAD WITHOUT CONTRAST TECHNIQUE: Contiguous axial images were obtained from the base of the skull through the vertex without intravenous contrast. COMPARISON:  07/28/2015 FINDINGS: Brain: No evidence of acute infarction, hemorrhage, hydrocephalus, extra-axial collection or mass lesion/mass effect. Chronic left basal  ganglia lacunar infarct (series 2/ image 15). Vascular: No hyperdense vessel or unexpected calcification. Skull: No evidence of calvarial fracture. Sinuses/Orbits: Minimal partial opacification of the left sphenoid sinus. Other: Mild cortical atrophy.  No ventriculomegaly. Subcortical white matter and periventricular small vessel ischemic changes. Intracranial atherosclerosis. IMPRESSION: No evidence of acute intracranial abnormality. Chronic left basal ganglia lacunar infarct. Atrophy with small vessel ischemic changes. Electronically Signed   By: Julian Hy M.D.   On: 03/14/2016 17:47      Subjective: - no chest pain, shortness of breath, no abdominal pain, nausea or vomiting.   Discharge Exam: Vitals:   04/12/16 1012 04/12/16 1027  BP: (!) 147/69   Pulse: 72 66  Resp: (!) 22   Temp: 97.5 F (36.4 C)    Vitals:   04/11/16 2355 04/12/16 0518 04/12/16 1012 04/12/16 1027  BP: 115/83 (!) 141/91 (!) 147/69   Pulse: 66 66 72 66  Resp: 18 18 (!) 22   Temp: 98.1 F (36.7 C) 97.9 F (36.6 C) 97.5 F (36.4 C)   TempSrc: Oral Oral Oral   SpO2: 93% 96% 97%   Weight:  63.2 kg (139 lb 6.4 oz)    Height:        General: Pt is alert, awake, not in acute distress Cardiovascular: RRR, S1/S2 +, no rubs, no gallops Respiratory: CTA bilaterally, no wheezing, no rhonchi    The results of significant diagnostics from this hospitalization (including imaging, microbiology, ancillary and laboratory) are listed below for reference.     Microbiology: No results found for this or any previous visit (from the past 240 hour(s)).   Labs: BNP (last 3 results)  Recent Labs  01/06/16 0626 02/17/16 1300 04/09/16 1640  BNP 1,178.4* 495.4* 0000000*   Basic Metabolic Panel:  Recent Labs Lab 04/09/16 1640 04/10/16 0447 04/11/16 1002  NA 136 139 139  K 3.6 3.3* 3.8  CL 104 106 104  CO2 26 23 29   GLUCOSE 148* 129* 128*  BUN 17 15 15   CREATININE 1.26* 1.10* 1.28*  CALCIUM 8.8* 8.6*  8.3*  MG  --   --  1.4*   Liver Function Tests: No results for input(s): AST, ALT, ALKPHOS, BILITOT, PROT, ALBUMIN in the last 168 hours. No results for input(s): LIPASE, AMYLASE in the last 168 hours. No results for input(s): AMMONIA in the last 168 hours. CBC:  Recent Labs Lab 04/09/16 1640 04/10/16 0447  WBC 5.8 6.0  HGB 10.4* 10.1*  HCT 31.6* 30.5*  MCV 65.8* 65.6*  PLT 370 286   Cardiac Enzymes:  Recent Labs Lab 04/10/16 0447 04/10/16 0948 04/10/16 1928  TROPONINI <0.03 <0.03 <0.03   BNP: Invalid input(s): POCBNP CBG: No results for input(s): GLUCAP in the last 168 hours. D-Dimer No results for input(s): DDIMER in the last 72 hours. Hgb A1c No results for input(s): HGBA1C in the last 72 hours. Lipid Profile No results for input(s): CHOL, HDL, LDLCALC, TRIG, CHOLHDL, LDLDIRECT in the last 72 hours. Thyroid function studies No results for input(s): TSH, T4TOTAL, T3FREE, THYROIDAB in the last 72 hours.  Invalid input(s): FREET3 Anemia work up No results for  input(s): VITAMINB12, FOLATE, FERRITIN, TIBC, IRON, RETICCTPCT in the last 72 hours. Urinalysis    Component Value Date/Time   COLORURINE YELLOW 07/28/2015 1050   APPEARANCEUR CLEAR 07/28/2015 1050   LABSPEC 1.010 07/28/2015 1050   PHURINE 7.0 07/28/2015 1050   GLUCOSEU NEGATIVE 07/28/2015 1050   HGBUR NEGATIVE 07/28/2015 [place] 07/28/2015 1050   KETONESUR NEGATIVE 07/28/2015 1050   PROTEINUR NEGATIVE 07/28/2015 1050   UROBILINOGEN 2.0 (H) 04/13/2015 1844   NITRITE NEGATIVE 07/28/2015 1050   LEUKOCYTESUR TRACE (A) 07/28/2015 1050   Sepsis Labs Invalid input(s): PROCALCITONIN,  WBC,  LACTICIDVEN Microbiology No results found for this or any previous visit (from the past 240 hour(s)).   Time coordinating discharge: Over 30 minutes  SIGNED:  Marzetta Board, MD  [facility] 04/12/2016, 1:21 PM Pager [phone]  If 7PM-7AM, please contact  night-coverage [email or web address] Password [email or web address]

## 2016-04-18 ENCOUNTER — Other Ambulatory Visit: Payer: Self-pay

## 2016-04-18 NOTE — Patient Outreach (Addendum)
[facility] [facility]) Care Management  04/18/2016  Nicole [name] 23-Oct-1930 [id number]     EMMI-HF RED ON EMMI ALERT Day # 4 Date: 04/17/16 Red Alert Reason: "New/worsening problems? Yes"    Outreach attempt #1 to patient. Spoke with patient who reported she couldn't talk long as she was eating her lunch.  Red alert addressed with patient. She reports that she did not mean to answer the question with a yes. She denies any new or worsening problems. She reports she is "doing just fine" since hospital discharge. She denies any issues with transportation and/or managing/affording meds. She voices f/u appts are in place.  Patient has supportive family that assists with her care needs. She denies any RN CM needs/concerns. Advised patient that she will continue to get automated calls for EMMI HF post discharge program and will receive a call from an RN if any of her responses trigger a red alert. She voiced understanding and appreciated call.      Plan: RN CM will notify [facility] administrative assistant of case closure status.    Enzo Montgomery, RN,BSN,CCM [place] [facility] Telephonic Care Management Coordinator Direct Phone: [phone] Toll Free: [phone] Fax: [phone]

## 2016-04-19 ENCOUNTER — Ambulatory Visit: Payer: Medicare Other | Admitting: Physician Assistant

## 2016-04-20 ENCOUNTER — Other Ambulatory Visit: Payer: Self-pay

## 2016-04-20 NOTE — Patient Outreach (Signed)
[facility] [facility]) Care Management  04/20/2016  Nicole [name] [birth year] [id number]       EMMI-HF RED ON EMMI ALERT Day # 6 Date: 04/20/16 Red Alert Reason: " New/worsening problems? Yes  New/worsening SOB? Yes"   Outreach attempt #1 to patient. Spoke with patient. Addressed red alerts. Patient denies having and new symptoms or issues at this time. She reports she is doing and feeling fine. No SOB. Patient voices it must have been a mistake and she misunderstood what she was being asked. Patient advised she will continue to get automated calls daily to check on her and if if any of her responses are abnormal or off she will receive call from nurse to check on her. She voiced understanding and was appreciative of call.      Plan: RN CM will notify [facility] administrative assistant of case closure status.   Enzo Montgomery, RN,BSN,CCM [place] [facility] Telephonic Care Management Coordinator Direct Phone: [phone] Toll Free: [phone] Fax: [phone]

## 2016-04-25 ENCOUNTER — Encounter: Payer: Self-pay | Admitting: Physician Assistant

## 2016-04-25 ENCOUNTER — Ambulatory Visit (INDEPENDENT_AMBULATORY_CARE_PROVIDER_SITE_OTHER): Payer: Medicare Other | Admitting: Physician Assistant

## 2016-04-25 VITALS — BP 148/82 | HR 77 | Ht 68.0 in | Wt 145.4 lb

## 2016-04-25 DIAGNOSIS — I5032 Chronic diastolic (congestive) heart failure: Secondary | ICD-10-CM | POA: Diagnosis not present

## 2016-04-25 DIAGNOSIS — I482 Chronic atrial fibrillation: Secondary | ICD-10-CM

## 2016-04-25 DIAGNOSIS — Z79899 Other long term (current) drug therapy: Secondary | ICD-10-CM | POA: Diagnosis not present

## 2016-04-25 DIAGNOSIS — I10 Essential (primary) hypertension: Secondary | ICD-10-CM

## 2016-04-25 DIAGNOSIS — I4821 Permanent atrial fibrillation: Secondary | ICD-10-CM

## 2016-04-25 LAB — BASIC METABOLIC PANEL
BUN: 27 mg/dL — ABNORMAL HIGH (ref 7–25)
CALCIUM: 9.2 mg/dL (ref 8.6–10.4)
CO2: 26 mmol/L (ref 20–31)
Chloride: 103 mmol/L (ref 98–110)
Creat: 1.78 mg/dL — ABNORMAL HIGH (ref 0.60–0.88)
GLUCOSE: 207 mg/dL — AB (ref 65–99)
Potassium: 4.4 mmol/L (ref 3.5–5.3)
SODIUM: 139 mmol/L (ref 135–146)

## 2016-04-25 NOTE — Progress Notes (Signed)
Cardiology Office Note   Date:  04/25/2016   ID:  [name], DOB [birth year], MRN [id number]  PCP:  Hollace Kinnier, DO  Cardiologist:  Dr Debara Pickett 11/2015 Rosaria Ferries, PA-C    History of Present Illness: Nicole [name] is a 82 y.o. female with a history of chronic afib, HTN, DM, 4.8 cm AAA 07/2015, anxiety, CHF w/ EF 45% & mod MR by echo, CKD III, dementia. Plavix d/c'd due to bleeding after gallbladder surgery, CHA2DS2VASc=6 (age x 2, female, HTN, DM, CHF). Eliquis started but d/c'd after epistaxis requiring packing>>coumadin but pt was falling and not getting coumadin checks>>intracerebral hemorrhage 2nd BP 160/120 & INR slightly >3 so warfarin d/c'd. Not a Watchman candidate because Dr Leonie Man believes she would not tolerate 2 mo anticoag.  D/c 09/21 after admit for HTN urgency, CHF, wt 139 lbs on dc.    Lanna [name] presents for post hospital follow-up.  Since discharge from the hospital, her son has been cooking for her. There has been a real emphasis on low sodium foods and the family believes they are compliant with a low-sodium diet. She has been checking her weight daily, and does not believe she has gained any. Her weight at home is approximately 140 pounds, but they do not remember exactly.  She feels well. She has not had lower extremity edema, orthopnea, or PND. She does not do much, but has not had shortness of breath with activity. She has not had chest pain. She does not get palpitations. She has not had dizziness, presyncope or syncope. Her family helps make sure she is compliant with her medications. She has not had any bleeding issues.   Past Medical History:  Diagnosis Date  . AAA (abdominal aortic aneurysm) ([place])   . Acute upper respiratory infections of unspecified site   . Anemia   . Anxiety   . Aortic aneurysm of unspecified site without mention of rupture   . Arthritis   . Atrial fibrillation ([place])   . Cervicalgia   . Cholelithiasis   . Chronic  systolic heart failure ([place])   . CKD (chronic kidney disease)   . Colon cancer ([place]) 07/1997  . Depression   . Diabetes mellitus ([place])   . Dizziness and giddiness   . Electrolyte and fluid disorders not elsewhere classified   . GERD (gastroesophageal reflux disease)   . Gout, unspecified   . Hemorrhage of rectum and anus   . Hypercholesteremia   . Hypertension   . Hypoglycemia, unspecified   . Hypopotassemia   . Hypothyroidism   . Kidney stone    renal calculi  . Malignant neoplasm of colon, unspecified site   . Other chronic allergic conjunctivitis   . Other specified cardiac dysrhythmias(427.89)   . Pain in joint, lower leg   . Perforation of bile duct   . Proteinuria   . Shortness of breath   . Small bowel obstruction due to adhesions 05/16/2012  . Stroke ([place])   . Swelling, mass, or lump in head and neck   . Thoracic aneurysm without mention of rupture    4.6 cm Asc Aortic aneurysm, CT 07/2015    Past Surgical History:  Procedure Laterality Date  . CARDIAC CATHETERIZATION  2008   moderate severe pulm HTN; with elevted pulm capillary wedge pressure   . CHOLECYSTECTOMY  05/14/2012   Procedure: LAPAROSCOPIC CHOLECYSTECTOMY WITH INTRAOPERATIVE CHOLANGIOGRAM;  Surgeon: Haywood Lasso, MD;  Location: [facility];  Service: General;  Laterality: N/A;  .  COLON SURGERY     to remove colon ca  . ERCP  05/17/2012   Procedure: ENDOSCOPIC RETROGRADE CHOLANGIOPANCREATOGRAPHY (ERCP);  Surgeon: Missy Sabins, MD;  Location: [facility];  Service: Gastroenterology;  Laterality: N/A;  . ERCP  08/12/2012   Procedure: ENDOSCOPIC RETROGRADE CHOLANGIOPANCREATOGRAPHY (ERCP);  Surgeon: Missy Sabins, MD;  Location: [facility] ENDOSCOPY;  Service: Endoscopy;  Laterality: N/A;  . HEMICOLECTOMY  08/11/1997  . KIDNEY STONE SURGERY    . TRANSTHORACIC ECHOCARDIOGRAM  11/2009   EF 45-50%, mild-mod AV regurg; mild MR; mod TR; ascending aorta mildly dilated    Current Outpatient Prescriptions  Medication Sig  Dispense Refill  . allopurinol (ZYLOPRIM) 100 MG tablet Take 1 tablet by mouth  daily 90 tablet 2  . aspirin EC 81 MG tablet Take 81 mg by mouth daily.    . butalbital-acetaminophen-caffeine (FIORICET, ESGIC) 50-325-40 MG tablet Take 1 tablet by mouth every 6 (six) hours as needed for headache. 14 tablet 0  . cloNIDine (CATAPRES) 0.2 MG tablet TAKE 1 TABLET BY MOUTH THREE TIMES DAILY FOR HYPERTENSION (Patient taking differently: TAKE 1 TABLET BY MOUTH TWO TIMES DAILY FOR HYPERTENSION) 90 tablet 1  . donepezil (ARICEPT) 10 MG tablet Take 1 tablet (10 mg total) by mouth at bedtime. 90 tablet 3  . furosemide (LASIX) 20 MG tablet Take 1 tablet by mouth daily, Take 1 extra tablet for more than a 3lb weight gain in a day along with potassium. 90 tablet 0  . hydrALAZINE (APRESOLINE) 50 MG tablet Take 1 tablet (50 mg total) by mouth 3 (three) times daily. 120 tablet 3  . isosorbide mononitrate (IMDUR) 30 MG 24 hr tablet Take 1 tablet (30 mg total) by mouth daily. (Patient not taking: Reported on 04/09/2016) 90 tablet 3  . levothyroxine (SYNTHROID, LEVOTHROID) 50 MCG tablet Take one tablet by mouth 30 minutes before breakfast away from other medications for thyroid (Patient not taking: Reported on 04/09/2016) 90 tablet 1  . Melatonin 5 MG CAPS Take 1 capsule (5 mg total) by mouth at bedtime as needed (insomnia). (Patient not taking: Reported on 04/09/2016) 90 capsule 3  . metoprolol (LOPRESSOR) 100 MG tablet Take 1 tablet (100 mg total) by mouth 2 (two) times daily. 180 tablet 3  . polyethylene glycol (MIRALAX / GLYCOLAX) packet Take 17 g by mouth daily as needed for moderate constipation. (Patient not taking: Reported on 04/09/2016) 14 each 0  . potassium chloride (K-DUR,KLOR-CON) 10 MEQ tablet Take 10 mEq by mouth daily. Taking along with lasix    . spironolactone (ALDACTONE) 25 MG tablet Take 1 tablet (25 mg total) by mouth daily. (Patient not taking: Reported on 04/09/2016) 90 tablet 3   No current  facility-administered medications for this visit.     Allergies:   Iohexol; Iodinated diagnostic agents; and Z-pak [azithromycin]    Social History:  The patient  reports that she has never smoked. She has never used smokeless tobacco. She reports that she does not drink alcohol or use drugs.   Family History:  The patient's family history includes Diabetes in her sister; Heart disease in her mother; Hypertension in her daughter, son, and son; Stroke in her father.   ROS:  Please see the history of present illness. All other systems are reviewed and negative.   PHYSICAL EXAM: VS:  BP (!) 148/82   Pulse 77   Ht 5\' 8"  (1.727 m)   Wt 145 lb 6.4 oz (66 kg)   BMI 22.11 kg/m  , BMI Body  mass index is 22.11 kg/m. GEN: Well nourished, well developed, female in no acute distress  HEENT: normal for age  Neck: no JVD, no carotid bruit, no masses Cardiac: RRR; 2/6 murmur, no rubs, or gallops Respiratory:  clear to auscultation bilaterally, normal work of breathing GI: soft, nontender, nondistended, + BS MS: no deformity or atrophy; no edema; distal pulses are 2+ in all 4 extremities   Skin: warm and dry, no rash Neuro:  Strength and sensation are intact Psych: euthymic mood, full affect   EKG:  EKG is ordered today. The ekg ordered today demonstrates Atrial fibrillation, heart rate 77  ECHO: 04/11/2016 - Left ventricle: The cavity size was normal. Wall thickness was   increased in a pattern of moderate LVH. Systolic function was   normal. The estimated ejection fraction was in the range of 60%   to 65%. Wall motion was normal; there were no regional wall   motion abnormalities. - Aortic valve: Mildly to moderately calcified annulus. There was   moderate regurgitation. - Mitral valve: There was mild regurgitation. - Left atrium: The atrium was moderately dilated. - Right atrium: The atrium was moderately dilated. - Tricuspid valve: There was moderate regurgitation. - Pulmonic valve:  There was moderate regurgitation. - Pulmonary arteries: Systolic pressure was moderately increased.   PA peak pressure: 51 mm Hg (S).  Recent Labs: 03/14/2016: ALT 46; TSH 2.76 04/09/2016: B Natriuretic Peptide 1,588.5 04/10/2016: Hemoglobin 10.1; Platelets 286 04/11/2016: BUN 15; Creatinine, Ser 1.28; Magnesium 1.4; Potassium 3.8; Sodium 139    Lipid Panel    Component Value Date/Time   CHOL 142 10/12/2015 0928   TRIG 82 10/12/2015 0928   HDL 47 10/12/2015 0928   CHOLHDL 3.0 10/12/2015 0928   CHOLHDL 3.7 04/05/2015 0819   VLDL 21 04/05/2015 0819   LDLCALC 79 10/12/2015 0928     Wt Readings from Last 3 Encounters:  04/25/16 145 lb 6.4 oz (66 kg)  04/12/16 139 lb 6.4 oz (63.2 kg)  03/14/16 147 lb 12.8 oz (67 kg)     Other studies Reviewed: Additional studies/ records that were reviewed today include: Office notes, hospital records and testing.  ASSESSMENT AND PLAN:  1.  Chronic diastolic CHF: Her weight is up on our scales today, but she states that it is stable on her home scales. We will continue current therapy. She was reminded that it is okay to take an extra Lasix tablet as needed, and given parameters for this. She has been on her current medication regimen for 2 weeks, we will check BMET today.  She is encouraged to continue the low sodium diet and current medication regimen.   2. Hypertension: Her blood pressure is up slightly today, but she is on multidrug therapy. Any increase might drop her blood pressure low enough to give her problems with orthostatic dizziness or being lightheaded. Continue current therapy.  3. Permanent atrial fibrillation: She is asymptomatic with this and not aware. She her family understand the risks outweighed the benefits of anticoagulation for her. Continue baby aspirin.  CHA2DS2VASc=6 (age x 2, female, HTN, DM, CHF).   Current medicines are reviewed at length with the patient today.  The patient does not have concerns regarding  medicines.  The following changes have been made:  no change  Labs/ tests ordered today include:   Orders Placed This Encounter  Procedures  . Basic Metabolic Panel (BMET)  . EKG 12-Lead     Disposition:   FU with Dr. Debara Pickett  Signed, [place],  [address]  04/25/2016 3:04 PM    [facility] Phone: [phone]; Fax: [phone]  This note was written with the assistance of speech recognition software. Please excuse any transcriptional errors.

## 2016-04-25 NOTE — Patient Instructions (Signed)
Medication Instructions:  Continue current medications  Labwork: BMP  Testing/Procedures: None Ordered  Follow-Up: Your physician recommends that you schedule a follow-up appointment in: 3 Months with Dr Debara Pickett   Any Other Special Instructions Will Be Listed Below (If Applicable).  It's ok to take an extra Lasix and potassium tablets up to twice a day as need for weight gain of 3lbs in a day or 5lbs in a week   If you need a refill on your cardiac medications before your next appointment, please call your pharmacy.

## 2016-05-01 ENCOUNTER — Emergency Department (HOSPITAL_COMMUNITY)
Admission: EM | Admit: 2016-05-01 | Discharge: 2016-05-01 | Disposition: A | Discharge status: Home / Self Care | Payer: Medicare Other | Attending: Emergency Medicine | Admitting: Emergency Medicine

## 2016-05-01 ENCOUNTER — Encounter (HOSPITAL_COMMUNITY): Payer: Self-pay

## 2016-05-01 ENCOUNTER — Emergency Department (HOSPITAL_COMMUNITY): Payer: Medicare Other

## 2016-05-01 DIAGNOSIS — I5043 Acute on chronic combined systolic (congestive) and diastolic (congestive) heart failure: Secondary | ICD-10-CM | POA: Insufficient documentation

## 2016-05-01 DIAGNOSIS — I130 Hypertensive heart and chronic kidney disease with heart failure and stage 1 through stage 4 chronic kidney disease, or unspecified chronic kidney disease: Secondary | ICD-10-CM | POA: Insufficient documentation

## 2016-05-01 DIAGNOSIS — R55 Syncope and collapse: Secondary | ICD-10-CM | POA: Diagnosis present

## 2016-05-01 DIAGNOSIS — R404 Transient alteration of awareness: Secondary | ICD-10-CM | POA: Diagnosis not present

## 2016-05-01 DIAGNOSIS — Z7982 Long term (current) use of aspirin: Secondary | ICD-10-CM | POA: Diagnosis not present

## 2016-05-01 DIAGNOSIS — Z8673 Personal history of transient ischemic attack (TIA), and cerebral infarction without residual deficits: Secondary | ICD-10-CM | POA: Diagnosis not present

## 2016-05-01 DIAGNOSIS — N189 Chronic kidney disease, unspecified: Secondary | ICD-10-CM | POA: Insufficient documentation

## 2016-05-01 DIAGNOSIS — E039 Hypothyroidism, unspecified: Secondary | ICD-10-CM | POA: Diagnosis not present

## 2016-05-01 DIAGNOSIS — Z85038 Personal history of other malignant neoplasm of large intestine: Secondary | ICD-10-CM | POA: Diagnosis not present

## 2016-05-01 DIAGNOSIS — R42 Dizziness and giddiness: Secondary | ICD-10-CM | POA: Diagnosis not present

## 2016-05-01 DIAGNOSIS — R0602 Shortness of breath: Secondary | ICD-10-CM | POA: Diagnosis not present

## 2016-05-01 LAB — URINALYSIS, ROUTINE W REFLEX MICROSCOPIC
Bilirubin Urine: NEGATIVE
GLUCOSE, UA: NEGATIVE mg/dL
HGB URINE DIPSTICK: NEGATIVE
KETONES UR: NEGATIVE mg/dL
Leukocytes, UA: NEGATIVE
Nitrite: NEGATIVE
PROTEIN: NEGATIVE mg/dL
Specific Gravity, Urine: 1.011 (ref 1.005–1.030)
pH: 6 (ref 5.0–8.0)

## 2016-05-01 LAB — CBC
HCT: 32.7 % — ABNORMAL LOW (ref 36.0–46.0)
Hemoglobin: 10.8 g/dL — ABNORMAL LOW (ref 12.0–15.0)
MCH: 21.5 pg — AB (ref 26.0–34.0)
MCHC: 33 g/dL (ref 30.0–36.0)
MCV: 65.1 fL — AB (ref 78.0–100.0)
PLATELETS: 290 10*3/uL (ref 150–400)
RBC: 5.02 MIL/uL (ref 3.87–5.11)
RDW: 19.2 % — AB (ref 11.5–15.5)
WBC: 5 10*3/uL (ref 4.0–10.5)

## 2016-05-01 LAB — BASIC METABOLIC PANEL
Anion gap: 9 (ref 5–15)
BUN: 24 mg/dL — AB (ref 6–20)
CALCIUM: 9.2 mg/dL (ref 8.9–10.3)
CHLORIDE: 104 mmol/L (ref 101–111)
CO2: 25 mmol/L (ref 22–32)
CREATININE: 1.46 mg/dL — AB (ref 0.44–1.00)
GFR calc non Af Amer: 32 mL/min — ABNORMAL LOW (ref >60)
GFR, EST AFRICAN AMERICAN: 37 mL/min — AB (ref >60)
Glucose, Bld: 107 mg/dL — ABNORMAL HIGH (ref 65–99)
Potassium: 4.3 mmol/L (ref 3.5–5.1)
SODIUM: 138 mmol/L (ref 135–145)

## 2016-05-01 LAB — CBG MONITORING, ED: Glucose-Capillary: 101 mg/dL — ABNORMAL HIGH (ref 65–99)

## 2016-05-01 LAB — BRAIN NATRIURETIC PEPTIDE: B NATRIURETIC PEPTIDE 5: 383.1 pg/mL — AB (ref 0.0–100.0)

## 2016-05-01 NOTE — ED Triage Notes (Addendum)
BIB EMS from Home w/ c/o syncopal episode this AM and dizziness in standing and sitting positions. Pt has hx of dementia. Pt denies head pain or neck pain. Pt ambulatory w/o assistance. Pt denies n/v.

## 2016-05-01 NOTE — ED Notes (Signed)
Bed: WA02 Expected date:  Expected time:  Means of arrival:  Comments: EMS-hot/cold

## 2016-05-01 NOTE — ED Provider Notes (Signed)
[facility] DEPT Provider Note   CSN: [id number] Arrival date & time: 05/01/16  1246     History   Chief Complaint Chief Complaint  Patient presents with  . Dizziness  . Loss of Consciousness    HPI Nicole [name] is a 82 y.o. female.  Patient is 82 yo F with PMH of atrial fibrillation, CHF, CKD, diabetes, hypertension, hypercholesterolemia, and CVA, presenting with caretaker from home who states she had a syncopal episode around 12 noon today. Patient was seated at kitchen table with caretaker, who left room briefly, and returned to find patient slumped over at the table and drooling. She was easily arousable, alert, and had no slurred speech or facial asymmetry, but caretaker called EMS out of concern for patient's various medical conditions. Patient reports some shortness of breath, which is chronic and worse with movement and lying down flat. Denies any recent fevers, headache, dizziness, chest pain, palpitations, cough, swelling in legs, abdominal pain, blood in stools, dysuria, numbness, or weakness. Patient is not on anticoagulants, but is on rate control medications. Discharged from hospital 9/21 after admission for hypertensive urgency with pulmonary edema. Echo performed on 9/20 showed evidence of LVH with normal systolic function and ejection fraction of 60-65%. Patient was [facility] at [facility] on [facility]25[facility]. Caretaker reports good family support and efforts to control CHF with low-sodium diet. Denies recent weight gain.      Past Medical History:  Diagnosis Date  . AAA (abdominal aortic aneurysm) ([place])   . Acute upper respiratory infections of unspecified site   . Anemia   . Anxiety   . Aortic aneurysm of unspecified site without mention of rupture   . Arthritis   . Atrial fibrillation ([place])   . Cervicalgia   . Cholelithiasis   . Chronic systolic heart failure ([place])   . CKD (chronic kidney disease)   . Colon cancer ([place]) 07/1997  . Depression   .  Diabetes mellitus ([place])   . Dizziness and giddiness   . Electrolyte and fluid disorders not elsewhere classified   . GERD (gastroesophageal reflux disease)   . Gout, unspecified   . Hemorrhage of rectum and anus   . Hypercholesteremia   . Hypertension   . Hypoglycemia, unspecified   . Hypopotassemia   . Hypothyroidism   . Kidney stone    renal calculi  . Malignant neoplasm of colon, unspecified site   . Other chronic allergic conjunctivitis   . Other specified cardiac dysrhythmias(427.89)   . Pain in joint, lower leg   . Perforation of bile duct   . Proteinuria   . Shortness of breath   . Small bowel obstruction due to adhesions 05/16/2012  . Stroke ([place])   . Swelling, mass, or lump in head and neck   . Thoracic aneurysm without mention of rupture    4.6 cm Asc Aortic aneurysm, CT 07/2015    Patient Active Problem List   Diagnosis Date Noted  . Abdominal pain, epigastric 01/07/2016  . Accelerated hypertension with diastolic congestive heart failure, NYHA class 3 ([place]) 01/05/2016  . Atrial fibrillation ([place]) 01/05/2016  . Rib pain on right side 10/14/2015  . Hypothyroidism due to acquired atrophy of thyroid 10/14/2015  . Type 2 diabetes mellitus with diabetic nephropathy, without long-term current use of insulin ([place]) 10/14/2015  . Essential hypertension 10/14/2015  . Acute on chronic systolic heart failure ([place]) 08/02/2015  . Acute on chronic diastolic heart failure ([place]) 08/02/2015  . Bradycardia 07/27/2015  . Chronic anemia  07/27/2015  . Chest pain   . Vascular dementia 06/13/2015  . Memory loss 06/13/2015  . Left hemiparesis ([place])   . Chronic diastolic congestive heart failure ([place])   . Persistent atrial fibrillation ([place])   . Type 2 diabetes mellitus with peripheral neuropathy (H[facility])   . ICH (intracerebral hemorrhage) ([place]) 04/04/2015  . Chronic atrial fibrillation ([place])   . Hypertensive urgency   . SOB (shortness of breath) 04/03/2014  . Epistaxis 02/15/2014  .  SBO (small bowel obstruction) 01/23/2014  . Diabetic nephropathy ([place]) 09/07/2013  . Chronic constipation 03/25/2013  . Carpal tunnel syndrome 02/25/2013  . Hypothyroidism   . Gout flare 06/01/2012  . Leakage of common bile duct s/p ERCP / stent 05/30/2012  . Ileus post op. still NPO 05/22/2012  . Bile leak, postoperative 05/17/2012  . Hypokalemia 05/17/2012  . Atrial fibrillation with RVR ([place]) 05/16/2012  . Postoperative ileus ([place]) 05/16/2012  . Small bowel obstruction due to adhesions 05/16/2012  . Acute on chronic renal insufficiency 05/15/2012  . Normal coronary arteries, 2008 05/15/2012  . Type II or unspecified type diabetes mellitus with neurological manifestations, not stated as uncontrolled(250.60) 05/15/2012  . HTN (hypertension) 05/15/2012  . Respiratory distress post op secondary to rapid AF 05/15/2012  . ARF (acute renal failure) ([place]) 05/15/2012  . Abdominal pain, acute, right upper quadrant 05/12/2012  . Nausea and vomiting 05/12/2012  . Dehydration 05/12/2012  . Cholecystitis chronic, acute, S/P lap cholecystectomy 05/14/12 05/12/2012  . Thoracic aortic aneurysm, 4.8cm 2011   . CHF, past NICM with an EF of 30%, most recent echo 2011 showed EF to be45- 50%   . Anemia 08/09/2011  . History of colon cancer, stage III 08/09/2011    Past Surgical History:  Procedure Laterality Date  . CARDIAC CATHETERIZATION  2008   moderate severe pulm HTN; with elevted pulm capillary wedge pressure   . CHOLECYSTECTOMY  05/14/2012   Procedure: LAPAROSCOPIC CHOLECYSTECTOMY WITH INTRAOPERATIVE CHOLANGIOGRAM;  Surgeon: Haywood Lasso, MD;  Location: [facility];  Service: General;  Laterality: N/A;  . COLON SURGERY     to remove colon ca  . ERCP  05/17/2012   Procedure: ENDOSCOPIC RETROGRADE CHOLANGIOPANCREATOGRAPHY (ERCP);  Surgeon: Missy Sabins, MD;  Location: [facility];  Service: Gastroenterology;  Laterality: N/A;  . ERCP  08/12/2012   Procedure: ENDOSCOPIC RETROGRADE  CHOLANGIOPANCREATOGRAPHY (ERCP);  Surgeon: Missy Sabins, MD;  Location: [facility] ENDOSCOPY;  Service: Endoscopy;  Laterality: N/A;  . HEMICOLECTOMY  08/11/1997  . KIDNEY STONE SURGERY    . TRANSTHORACIC ECHOCARDIOGRAM  11/2009   EF 45-50%, mild-mod AV regurg; mild MR; mod TR; ascending aorta mildly dilated    OB History    No data available       Home Medications    Prior to Admission medications   Medication Sig Start Date End Date Taking? Authorizing Provider  allopurinol (ZYLOPRIM) 100 MG tablet Take 1 tablet by mouth  daily 04/02/16   [name] [name], DO  aspirin EC 81 MG tablet Take 81 mg by mouth daily.    Historical Provider, MD  butalbital-acetaminophen-caffeine (FIORICET, ESGIC) [phone] MG tablet Take 1 tablet by mouth every 6 (six) hours as needed for headache. 01/08/16   Lavina Hamman, MD  cloNIDine (CATAPRES) 0.2 MG tablet TAKE 1 TABLET BY MOUTH THREE TIMES DAILY FOR HYPERTENSION Patient taking differently: TAKE 1 TABLET BY MOUTH TWO TIMES DAILY FOR HYPERTENSION 04/02/16   [name] [name], DO  donepezil (ARICEPT) 10 MG tablet Take 1 tablet (10  mg total) by mouth at bedtime. 01/09/16   [name] [name], DO  furosemide (LASIX) 20 MG tablet Take 1 tablet by mouth daily, Take 1 extra tablet for more than a 3lb weight gain in a day along with potassium. 02/27/16   [name] [name], DO  hydrALAZINE (APRESOLINE) 50 MG tablet Take 1 tablet (50 mg total) by mouth 3 (three) times daily. 04/12/16   Costin Karlyne Greenspan, MD  isosorbide mononitrate (IMDUR) 30 MG 24 hr tablet Take 1 tablet (30 mg total) by mouth daily. 01/20/16   [name] [name], DO  levothyroxine (SYNTHROID, LEVOTHROID) 50 MCG tablet Take one tablet by mouth 30 minutes before breakfast away from other medications for thyroid 01/05/16   [name] [name], DO  Melatonin 5 MG CAPS Take 1 capsule (5 mg total) by mouth at bedtime as needed (insomnia). 01/20/16   [name] [name], DO  metoprolol (LOPRESSOR) 100 MG tablet Take 1 tablet (100 mg total) by mouth  2 (two) times daily. 01/20/16   [name] [name], DO  polyethylene glycol (MIRALAX / GLYCOLAX) packet Take 17 g by mouth daily as needed for moderate constipation. 01/20/16   [name] [name], DO  potassium chloride (K-DUR,KLOR-CON) 10 MEQ tablet Take 10 mEq by mouth daily. Taking along with lasix    Historical Provider, MD  spironolactone (ALDACTONE) 25 MG tablet Take 1 tablet (25 mg total) by mouth daily. 01/20/16   [name], DO    Family History Family History  Problem Relation Age of Onset  . Heart disease Mother   . Stroke Father   . Hypertension Daughter   . Hypertension Son   . Diabetes Sister   . Hypertension Son     Social History Social History  Substance Use Topics  . Smoking status: Never Smoker  . Smokeless tobacco: Never Used  . Alcohol use No     Allergies   Iohexol; Iodinated diagnostic agents; and Z-pak [azithromycin]   Review of Systems Review of Systems  Constitutional: Negative for chills and fever.  HENT: Negative for ear pain and sore throat.   Eyes: Negative for pain and visual disturbance.  Respiratory: Positive for shortness of breath (chronic). Negative for cough.   Cardiovascular: Negative for chest pain, palpitations and leg swelling.  Gastrointestinal: Negative for abdominal pain, blood in stool, nausea and vomiting.  Genitourinary: Negative for dysuria, flank pain and hematuria.  Musculoskeletal: Negative for back pain and neck pain.  Skin: Negative for color change and rash.  Neurological: Positive for syncope. Negative for dizziness, seizures, weakness, numbness and headaches.     Physical Exam Updated Vital Signs BP 153/87 (BP Location: Right Arm)   Pulse 70   Temp 97.7 F (36.5 C) (Oral)   Resp 18   SpO2 99%   Physical Exam  Constitutional:  Elderly female, lying comfortably in bed, no acute distress  HENT:  Head: Normocephalic and atraumatic.  Mouth/Throat: Oropharynx is clear and moist.  Eyes: Conjunctivae and EOM are  normal. Pupils are equal, round, and reactive to light.  Neck: Normal range of motion. Neck supple. No JVD present.  Cardiovascular: Normal rate, normal heart sounds and intact distal pulses.  An irregularly irregular rhythm present.  No murmur heard. Pulmonary/Chest: Effort normal. No respiratory distress.  Speaking in full sentences, SpO2 99% on RA. Diminished lung sounds bilaterally, with fine crackles noted at left base, but absent on right.  Symmetric thoracic expansion, no accessory muscle use or retractions. No TTP of ribs, thoracic spine,  or chest wall.  Abdominal: Soft. Bowel sounds are normal. She exhibits no distension. There is no tenderness. There is no guarding.  Musculoskeletal: Normal range of motion.  No peripheral edema noted.  Neurological: She is alert.  Oriented to person and place, but not time Speech is clear and goal oriented, follows commands Cranial nerves III - XII without deficit, no facial droop Normal strength in upper and lower extremities bilaterally, strong and equal grip strength Sensation normal to light and sharp touch Moves extremities without ataxia, coordination intact Normal finger to nose and rapid alternating movements No pronator drift  Skin: Skin is warm and dry.  Psychiatric: She has a normal mood and affect.  Nursing note and vitals reviewed.    ED Treatments / Results  Labs (all labs ordered are listed, but only abnormal results are displayed) Labs Reviewed  CBG MONITORING, ED - Abnormal; Notable for the following:       Result Value   Glucose-Capillary 101 (*)    All other components within normal limits  BASIC METABOLIC PANEL  CBC  URINALYSIS, ROUTINE W REFLEX MICROSCOPIC (NOT AT [facility])  BRAIN NATRIURETIC PEPTIDE  CBG MONITORING, ED    EKG  EKG Interpretation  Date/Time:  Tuesday May 01 2016 13:05:50 EDT Ventricular Rate:  55 PR Interval:    QRS Duration: 122 QT Interval:  466 QTC Calculation: 446 R  Axis:   50 Text Interpretation:  Atrial fibrillation Nonspecific intraventricular conduction delay Minimal ST depression, lateral leads Baseline wander in lead(s) II III aVF No significant change since last tracing Confirmed by KNAPP  MD-J, JON [id number]) on 05/01/2016 1:08:37 PM       Radiology No results found.  Procedures Procedures (including critical care time)  Medications Ordered in ED Medications - No data to display   Initial Impression / Assessment and Plan / ED Course  I have reviewed the triage vital signs and the nursing notes.  Pertinent labs & imaging results that were available during my care of the patient were reviewed by me and considered in my medical decision making (see chart for details).  Clinical Course   Patient is 82 yo F with various chronic medical conditions including atrial fibrillation, CHF, and hypertension, presenting from home after caretaker found patient slumped over at table around 12 noon today. Patient was easily arousable and had no slurred speech or facial asymmetry. Completely normal neuro exam in ED today. Discussed case with attending physician, Dr. Dorie Rank, who evaluated patient and agreed normal neuro exam is not concerning for TIA, ICH, or stroke. Imaging deferred at this time. EKG shows a fib with no signficant changes, confirmed by Dr. Tomi Bamberger. Personally reviewed CXR, which shows no evidence of pulmonary edema or significant pleural effusions. Labs including CBC and BMP unchanged from baseline. BNP 383, which is much improved from 1588 on last admission 9/18. Urinalysis normal. On reassessment, patient is resting comfortably in bed, surrounded by family. VSS with O2 sat 99% RA.  Patient ambulated without difficulty. Patient stable for d/c home, and advised to f/u with PCP in 2 days. Discussed strict return precautions to ED for any new or worsening symptoms including chest pain, worsening shortness of breath, numbness, weakness, slurred speech,  or change in mental status.  Final Clinical Impressions(s) / ED Diagnoses   Final diagnoses:  Altered awareness, transient    New Prescriptions New Prescriptions   No medications on file     Rosilyn Mings II, [place] 05/01/16 1636  Dorie Rank, MD 05/02/16 2131

## 2016-05-22 ENCOUNTER — Telehealth: Payer: Self-pay

## 2016-05-22 NOTE — Telephone Encounter (Signed)
-----   Message from Lonn Georgia, PA-C sent at 04/26/2016  1:52 PM EDT ----- Pt of Dr Debara Pickett Her BUN/Cr are up. She is living with her son, I would speak to one of her children if possible. Please let her know she should continue to track her weight and continue heart healthy diet changes She needs to cut back the Lasix and potassium to every other day. Thanks

## 2016-05-22 NOTE — Telephone Encounter (Signed)
Attempted to call patient; no answer, unable to leave voicemail-no v/m set up. Will try again later.

## 2016-05-24 ENCOUNTER — Encounter: Payer: Self-pay | Admitting: Internal Medicine

## 2016-05-24 ENCOUNTER — Ambulatory Visit (INDEPENDENT_AMBULATORY_CARE_PROVIDER_SITE_OTHER): Payer: Medicare Other | Admitting: Internal Medicine

## 2016-05-24 ENCOUNTER — Telehealth: Payer: Self-pay | Admitting: Physician Assistant

## 2016-05-24 ENCOUNTER — Ambulatory Visit (INDEPENDENT_AMBULATORY_CARE_PROVIDER_SITE_OTHER): Payer: Medicare Other

## 2016-05-24 VITALS — BP 138/70 | HR 86 | Temp 97.9°F | Ht 68.0 in | Wt 150.0 lb

## 2016-05-24 DIAGNOSIS — I10 Essential (primary) hypertension: Secondary | ICD-10-CM | POA: Diagnosis not present

## 2016-05-24 DIAGNOSIS — I482 Chronic atrial fibrillation, unspecified: Secondary | ICD-10-CM

## 2016-05-24 DIAGNOSIS — Z23 Encounter for immunization: Secondary | ICD-10-CM

## 2016-05-24 DIAGNOSIS — E1121 Type 2 diabetes mellitus with diabetic nephropathy: Secondary | ICD-10-CM

## 2016-05-24 DIAGNOSIS — Z0000 Encounter for general adult medical examination without abnormal findings: Secondary | ICD-10-CM | POA: Diagnosis not present

## 2016-05-24 DIAGNOSIS — I872 Venous insufficiency (chronic) (peripheral): Secondary | ICD-10-CM | POA: Diagnosis not present

## 2016-05-24 DIAGNOSIS — F0150 Vascular dementia without behavioral disturbance: Secondary | ICD-10-CM

## 2016-05-24 DIAGNOSIS — I5032 Chronic diastolic (congestive) heart failure: Secondary | ICD-10-CM

## 2016-05-24 LAB — COMPLETE METABOLIC PANEL WITH GFR
ALT: 12 U/L (ref 6–29)
AST: 17 U/L (ref 10–35)
Albumin: 3.6 g/dL (ref 3.6–5.1)
Alkaline Phosphatase: 82 U/L (ref 33–130)
BUN: 23 mg/dL (ref 7–25)
CO2: 28 mmol/L (ref 20–31)
Calcium: 9.3 mg/dL (ref 8.6–10.4)
Chloride: 105 mmol/L (ref 98–110)
Creat: 1.69 mg/dL — ABNORMAL HIGH (ref 0.60–0.88)
GFR, Est African American: 32 mL/min — ABNORMAL LOW (ref >=60)
GFR, Est Non African American: 28 mL/min — ABNORMAL LOW (ref >=60)
Glucose, Bld: 205 mg/dL — ABNORMAL HIGH (ref 65–99)
Potassium: 4.3 mmol/L (ref 3.5–5.3)
Sodium: 141 mmol/L (ref 135–146)
Total Bilirubin: 0.5 mg/dL (ref 0.2–1.2)
Total Protein: 7.1 g/dL (ref 6.1–8.1)

## 2016-05-24 LAB — HEMOGLOBIN A1C
Hgb A1c MFr Bld: 7 % — ABNORMAL HIGH (ref <5.7)
Mean Plasma Glucose: 154 mg/dL

## 2016-05-24 NOTE — Progress Notes (Signed)
Location:  [facility] clinic Provider:  Andree Heeg L. Mariea Clonts, D.O., C.M.D.  Code Status: DNR Goals of Care:  Advanced Directives 05/24/2016  Does patient have an advance directive? Yes  Type of Paramedic of [place];Living will  Does patient want to make changes to advanced directive? -  Copy of advanced directive(s) in chart? Yes  Would patient like information on creating an advanced directive? -  Pre-existing out of facility DNR order (yellow form or pink MOST form) -     Chief Complaint  Patient presents with  . Medical Management of Chronic Issues    2 month follow up, DM foot exam due    HPI: Patient is a 82 y.o. female seen today for medical management of chronic diseases.    She is not a candidate for ace/arb due to her advanced CKD.  She is on hydralazine instead.  Was seen in the office in August for hypertensive urgency.  CT ordered.   A month later, she was hospitalized for hypertensive urgency and given labetalol, clonidine in the ED and lasix IV, then admitted also for the pulmonary edema.  She had gained 8 lbs from visit with Dr. Debara Pickett to hospitalization.  Says she is watching her sodium intake now.  Hydralazine frequency was increased at the hospital to tid (was not done before due to compliance concerns).  She also had low K and Mg that were repleted.    She followed up with cardiology 10/4 after d/c from hospital 9/21 and weight was up AGAIN.  BMP was repeated.  BP was also slightly up.   She has CHADS2vasc of 6 but has had intracranial bleeding so not a candidate for more than baby asa.   10/10, had a near syncopal episode where she was dizzy on standing and sitting.  There was no explanation in workup that day.  Dr. Debara Pickett reduced her potassium and diuretics to every other day--CMA tried to call pt's family, but no answer and no voicemail.  She's here today and still on lasix and potassium daily.  Weight is 150 lbs.  At home, it was 149, 148 yest, 149  two days ago.    Past Medical History:  Diagnosis Date  . AAA (abdominal aortic aneurysm) ([place])   . Acute upper respiratory infections of unspecified site   . Anemia   . Anxiety   . Aortic aneurysm of unspecified site without mention of rupture   . Arthritis   . Atrial fibrillation ([place])   . Cervicalgia   . Cholelithiasis   . Chronic systolic heart failure ([place])   . CKD (chronic kidney disease)   . Colon cancer ([place]) 07/1997  . Depression   . Diabetes mellitus ([place])   . Dizziness and giddiness   . Electrolyte and fluid disorders not elsewhere classified   . GERD (gastroesophageal reflux disease)   . Gout, unspecified   . Hemorrhage of rectum and anus   . Hypercholesteremia   . Hypertension   . Hypoglycemia, unspecified   . Hypopotassemia   . Hypothyroidism   . Kidney stone    renal calculi  . Malignant neoplasm of colon, unspecified site   . Other chronic allergic conjunctivitis   . Other specified cardiac dysrhythmias(427.89)   . Pain in joint, lower leg   . Perforation of bile duct   . Proteinuria   . Shortness of breath   . Small bowel obstruction due to adhesions 05/16/2012  . Stroke ([place])   . Swelling, mass,  or lump in head and neck   . Thoracic aneurysm without mention of rupture    4.6 cm Asc Aortic aneurysm, CT 07/2015    Past Surgical History:  Procedure Laterality Date  . CARDIAC CATHETERIZATION  2008   moderate severe pulm HTN; with elevted pulm capillary wedge pressure   . CHOLECYSTECTOMY  05/14/2012   Procedure: LAPAROSCOPIC CHOLECYSTECTOMY WITH INTRAOPERATIVE CHOLANGIOGRAM;  Surgeon: Haywood Lasso, MD;  Location: [place];  Service: General;  Laterality: N/A;  . COLON SURGERY     to remove colon ca  . ERCP  05/17/2012   Procedure: ENDOSCOPIC RETROGRADE CHOLANGIOPANCREATOGRAPHY (ERCP);  Surgeon: Missy Sabins, MD;  Location: [place];  Service: Gastroenterology;  Laterality: N/A;  . ERCP  08/12/2012   Procedure: ENDOSCOPIC RETROGRADE  CHOLANGIOPANCREATOGRAPHY (ERCP);  Surgeon: Missy Sabins, MD;  Location[facility] ENDOSCOPY;  Service: Endoscopy;  Laterality: N/A;  . HEMICOLECTOMY  08/11/1997  . KIDNEY STONE SURGERY    . TRANSTHORACIC ECHOCARDIOGRAM  11/2009   EF 45-50%, mild-mod AV regurg; mild MR; mod TR; ascending aorta mildly dilated    Allergies  Allergen Reactions  . Iohexol Nausea And Vomiting  . Iodinated Diagnostic Agents Nausea And Vomiting  . Z-Pak [Azithromycin] Other (See Comments)    Reaction:  Unknown       Medication List       Accurate as of 05/24/16 10:28 AM. Always use your most recent med list.          allopurinol 100 MG tablet Commonly known as:  ZYLOPRIM Take 100 mg by mouth daily.   aspirin EC 81 MG tablet Take 81 mg by mouth daily.   butalbital-acetaminophen-caffeine 50-325-40 MG tablet Commonly known as:  FIORICET, ESGIC Take 1 tablet by mouth every 6 (six) hours as needed for headache.   cloNIDine 0.2 MG tablet Commonly known as:  CATAPRES Take 0.2 mg by mouth 3 (three) times daily.   donepezil 10 MG tablet Commonly known as:  ARICEPT Take 1 tablet (10 mg total) by mouth at bedtime.   furosemide 20 MG tablet Commonly known as:  LASIX Take 20 mg by mouth daily. Pt takes one additional tablet daily for weight gain greater than 3 lbs.   hydrALAZINE 50 MG tablet Commonly known as:  APRESOLINE Take 1 tablet (50 mg total) by mouth 3 (three) times daily.   isosorbide mononitrate 30 MG 24 hr tablet Commonly known as:  IMDUR Take 30 mg by mouth at bedtime.   levothyroxine 50 MCG tablet Commonly known as:  SYNTHROID, LEVOTHROID Take 50 mcg by mouth daily before breakfast.   Melatonin 5 MG Caps Take 5 mg by mouth at bedtime as needed (for sleep).   metoprolol 100 MG tablet Commonly known as:  LOPRESSOR Take 1 tablet (100 mg total) by mouth 2 (two) times daily.   pantoprazole sodium 40 mg/20 mL Pack Commonly known as:  PROTONIX Place 40 mg into feeding tube as needed.     polyethylene glycol packet Commonly known as:  MIRALAX / GLYCOLAX Take 17 g by mouth daily as needed for moderate constipation.   potassium chloride 10 MEQ tablet Commonly known as:  K-DUR,KLOR-CON Take 10 mEq by mouth daily.   spironolactone 25 MG tablet Commonly known as:  ALDACTONE Take 1 tablet (25 mg total) by mouth daily.       Review of Systems:  Review of Systems  Constitutional: Negative for chills, fever and malaise/fatigue.  HENT: Positive for hearing loss.   Eyes: Negative for blurred  vision.       Glasses  Respiratory: Positive for shortness of breath. Negative for cough, hemoptysis, sputum production and wheezing.        Improved sob/doe  Cardiovascular: Negative for chest pain and palpitations.  Gastrointestinal: Negative for abdominal pain, blood in stool, constipation and melena.  Genitourinary: Negative for dysuria.  Musculoskeletal: Negative for falls.  Skin: Negative for itching and rash.  Neurological: Negative for dizziness and weakness.  Psychiatric/Behavioral: Positive for memory loss. Negative for depression. The patient does not have insomnia.     Health Maintenance  Topic Date Due  . OPHTHALMOLOGY EXAM  03/22/1941  . ZOSTAVAX  03/21/1993  . INFLUENZA VACCINE  02/21/2016  . FOOT EXAM  02/24/2016  . URINE MICROALBUMIN  06/30/2016  . HEMOGLOBIN A1C  07/17/2016  . MAMMOGRAM  09/08/2016  . TETANUS/TDAP  09/16/2021  . DEXA SCAN  Completed  . PNA vac Low Risk Adult  Completed    Physical Exam: Vitals:   05/24/16 1020  BP: 138/70  Pulse: 86  Temp: 97.9 F (36.6 C)  TempSrc: Oral  SpO2: 98%  Weight: 150 lb (68 kg)  Height: 5\' 8"  (1.727 m)   Body mass index is 22.81 kg/m. Physical Exam  Constitutional: No distress.  Clearly has lost weight  HENT:  Head: Normocephalic and atraumatic.  Cardiovascular: Intact distal pulses.   irreg irreg  Pulmonary/Chest: Effort normal and breath sounds normal. No respiratory distress. She has no  rales.  Abdominal: Soft. Bowel sounds are normal.  Musculoskeletal: Normal range of motion.  Neurological: She is alert. Coordination abnormal.  Oriented to person and knows she is at doctor's office  Skin: Skin is warm and dry. Capillary refill takes less than 2 seconds.  Psychiatric: She has a normal mood and affect.    Labs reviewed: Basic Metabolic Panel:  Recent Labs  06/13/15 1524  10/12/15 0928  01/08/16 0508  02/17/16 1259 03/14/16 1608  04/11/16 1002 04/25/16 1122 05/01/16 1332  NA  --   < > 139  < > 138  < > 138 139  < > 139 139 138  K  --   < > 4.0  < > 3.3*  < > 4.1 4.5  < > 3.8 4.4 4.3  CL  --   < > 100  < > 99*  < > 105 106  < > 104 103 104  CO2  --   < > 23  < > 29  < > 26 23  < > 29 26 25   GLUCOSE  --   < > 120*  < > 144*  < > 147* 100*  < > 128* 207* 107*  BUN  --   < > 12  < > 18  < > 21* 16  < > 15 27* 24*  CREATININE  --   < > 1.13*  < > 1.23*  < > 1.32* 1.11*  < > 1.28* 1.78* 1.46*  CALCIUM  --   < > 9.1  < > 9.1  < > 8.8* 9.4  < > 8.3* 9.2 9.2  MG  --   --   --   < > 1.5*  --  1.7  --   --  1.4*  --   --   TSH 2.210  --  3.760  --   --   --   --  2.76  --   --   --   --   < > = values in  this interval not displayed. Liver Function Tests:  Recent Labs  01/06/16 0626 02/17/16 1259 03/14/16 1608  AST 37 27 42*  ALT 33 21 46*  ALKPHOS 124 85 93  BILITOT 0.8 0.9 0.9  PROT 6.8 7.0 6.3  ALBUMIN 3.2* 3.3* 3.3*    Recent Labs  01/07/16 0921  LIPASE 51   No results for input(s): AMMONIA in the last 8760 hours. CBC:  Recent Labs  01/06/16 0626  02/17/16 1259 03/14/16 1608 04/09/16 1640 04/10/16 0447 05/01/16 1332  WBC 7.1  < > 6.3 5.2 5.8 6.0 5.0  NEUTROABS 5.1  --  4.3 2,912  --   --   --   HGB 10.1*  < > 10.2* 10.1* 10.4* 10.1* 10.8*  HCT 30.0*  < > 30.6* 32.7* 31.6* 30.5* 32.7*  MCV 59.5*  < > 63.6* 69.4* 65.8* 65.6* 65.1*  PLT 296  < > 315 354 370 286 290  < > = values in this interval not displayed. Lipid Panel:  Recent Labs   10/12/15 0928  CHOL 142  HDL 47  LDLCALC 79  TRIG 82  CHOLHDL 3.0   Lab Results  Component Value Date   HGBA1C 6.9 (H) 01/16/2016    Procedures since last visit: Dg Chest 2 View  Result Date: 05/01/2016 CLINICAL DATA:  Sudden shortness of breath. EXAM: CHEST  2 VIEW COMPARISON:  04/11/2016 FINDINGS: Stable enlargement of the cardiac silhouette. Central vascular structures are prominent for size but stable. The lungs are clear without pulmonary edema. The trachea is midline. There may be tiny pleural effusions versus pleural thickening. IMPRESSION: Stable cardiomegaly without pulmonary edema. Tiny pleural effusions versus pleural thickening. Electronically Signed   By: Markus Daft M.D.   On: 05/01/2016 14:44    Assessment/Plan 1. Type 2 diabetes mellitus with diabetic nephropathy, without long-term current use of insulin (HCC) - has been well controlled with diet only, also on baby asa, not on ace/arb due to ckd, is on hydralazine and nitrates - COMPLETE METABOLIC PANEL WITH GFR - Hemoglobin A1c  2. Chronic diastolic congestive heart failure ([place]) -improved control now since hospitalization -educated caregiver more about breakfast meats containing high amts of sodium even [place] bacon -cont aldactone daily, start doing lasix and potassium every other day as recommended by cardiology mid October (but had not been done--phone notes suggest pt's family and caregiver never got the message) -bmp done today to check on renal function which had been looking worse when seen at cardiology  3. Chronic atrial fibrillation (HCC) -cont lopressor for rate control -due to prior intracerebral hemorrhage (was on eliquis), she is not a candidate for more than baby aspirin  4. Essential hypertension -bp is ok today at 138/70 so cont regimen as in #2  5. Chronic venous insufficiency -elevate feet at rest   6. Subcortical vascular dementia, without behavioral disturbance -is more alert and  conversive today and her caregiver notes that she has been much better for the past 3 wks or so  Labs/tests ordered:   Orders Placed This Encounter  Procedures  . COMPLETE METABOLIC PANEL WITH GFR    [facility]  . Hemoglobin A1c   Next appt:  07/30/2016    Talishia Betzler L. Rhyan Wolters, D.O. [facility] [address], [place] [address] Cell Phone (Mon-Fri 8am-5pm):  [phone] On Call:  [phone] & follow prompts after 5pm & weekends Office Phone:  [phone] Office Fax:  [phone]

## 2016-05-24 NOTE — Telephone Encounter (Signed)
Incoming call from [place] at Dr Cyndi Lennert office. Wanted to clarify how patient should take medication. Looked at lab work from 04/26/16 where it was ordered to take furosemide and potassium every other day. She confirmed and will let patient know. Dr Mariea Clonts is ordering lab work today to check.

## 2016-05-24 NOTE — Patient Instructions (Signed)
Nicole [name] , Thank you for taking time to come for your Medicare Wellness Visit. I appreciate your ongoing commitment to your health goals. Please review the following plan we discussed and let me know if I can assist you in the future.   These are the goals we discussed: Goals    None      This is a list of the screening recommended for you and due dates:  Health Maintenance  Topic Date Due  . Eye exam for diabetics  03/22/1941  . Shingles Vaccine  03/21/1993  . Flu Shot  02/21/2016  . Complete foot exam   02/24/2016  . Urine Protein Check  06/30/2016  . Hemoglobin A1C  07/17/2016  . Mammogram  09/08/2016  . Tetanus Vaccine  09/16/2021  . DEXA scan (bone density measurement)  Completed  . Pneumonia vaccines  Completed  Preventive Care for Adults  A healthy lifestyle and preventive care can promote health and wellness. Preventive health guidelines for adults include the following key practices.  . A routine yearly physical is a good way to check with your health care provider about your health and preventive screening. It is a chance to share any concerns and updates on your health and to receive a thorough exam.  . Visit your dentist for a routine exam and preventive care every 6 months. Brush your teeth twice a day and floss once a day. Good oral hygiene prevents tooth decay and gum disease.  . The frequency of eye exams is based on your age, health, family medical history, use  of contact lenses, and other factors. Follow your health care provider's ecommendations for frequency of eye exams.  . Eat a healthy diet. Foods like vegetables, fruits, whole grains, low-fat dairy products, and lean protein foods contain the nutrients you need without too many calories. Decrease your intake of foods high in solid fats, added sugars, and salt. Eat the right amount of calories for you. Get information about a proper diet from your health care provider, if necessary.  . Regular physical  exercise is one of the most important things you can do for your health. Most adults should get at least 150 minutes of moderate-intensity exercise (any activity that increases your heart rate and causes you to sweat) each week. In addition, most adults need muscle-strengthening exercises on 2 or more days a week.  Silver Sneakers may be a benefit available to you. To determine eligibility, you may visit the website: www.silversneakers.com or contact program at [phone] Mon-Fri between 8AM-8PM.   . Maintain a healthy weight. The body mass index (BMI) is a screening tool to identify possible weight problems. It provides an estimate of body fat based on height and weight. Your health care provider can find your BMI and can help you achieve or maintain a healthy weight.   For adults 20 years and older: ? A BMI below 18.5 is considered underweight. ? A BMI of 18.5 to 24.9 is normal. ? A BMI of 25 to 29.9 is considered overweight. ? A BMI of 30 and above is considered obese.   . Maintain normal blood lipids and cholesterol levels by exercising and minimizing your intake of saturated fat. Eat a balanced diet with plenty of fruit and vegetables. Blood tests for lipids and cholesterol should begin at age 40 and be repeated every 5 years. If your lipid or cholesterol levels are high, you are over 50, or you are at high risk for heart disease, you may need  your cholesterol levels checked more frequently. Ongoing high lipid and cholesterol levels should be treated with medicines if diet and exercise are not working.  . If you smoke, find out from your health care provider how to quit. If you do not use tobacco, please do not start.  . If you choose to drink alcohol, please do not consume more than 2 drinks per day. One drink is considered to be 12 ounces (355 mL) of beer, 5 ounces (148 mL) of wine, or 1.5 ounces (44 mL) of liquor.  . If you are 71-84 years old, ask your health care provider if you  should take aspirin to prevent strokes.  . Use sunscreen. Apply sunscreen liberally and repeatedly throughout the day. You should seek shade when your shadow is shorter than you. Protect yourself by wearing long sleeves, pants, a wide-brimmed hat, and sunglasses year round, whenever you are outdoors.  . Once a month, do a whole body skin exam, using a mirror to look at the skin on your back. Tell your health care provider of new moles, moles that have irregular borders, moles that are larger than a pencil eraser, or moles that have changed in shape or color.

## 2016-05-24 NOTE — Progress Notes (Signed)
Subjective:   Nicole [name] is a 82 y.o. female who presents for an Initial Medicare Annual Wellness Visit.  Review of Systems      Cardiac Risk Factors include: advanced age (>44men, >67 women);diabetes mellitus;hypertension;sedentary lifestyle     Objective:    Today's Vitals   05/24/16 1050 05/24/16 1115  BP: 138/70   Pulse: 86   Temp: 97.9 F (36.6 C)   TempSrc: Oral   SpO2: 98%   Weight: 150 lb (68 kg)   Height: 5\' 8"  (1.727 m)   PainSc: 0-No pain 0-No pain   Body mass index is 22.81 kg/m.   Current Medications (verified) Outpatient Encounter Prescriptions as of 05/24/2016  Medication Sig  . allopurinol (ZYLOPRIM) 100 MG tablet Take 100 mg by mouth daily.  Marland Kitchen aspirin EC 81 MG tablet Take 81 mg by mouth daily.  . butalbital-acetaminophen-caffeine (FIORICET, ESGIC) 50-325-40 MG tablet Take 1 tablet by mouth every 6 (six) hours as needed for headache.  . cloNIDine (CATAPRES) 0.2 MG tablet Take 0.2 mg by mouth 3 (three) times daily.  Marland Kitchen donepezil (ARICEPT) 10 MG tablet Take 1 tablet (10 mg total) by mouth at bedtime.  . furosemide (LASIX) 20 MG tablet Take 20 mg by mouth every other day. Pt takes one additional tablet daily for weight gain greater than 3 lbs.   . hydrALAZINE (APRESOLINE) 50 MG tablet Take 1 tablet (50 mg total) by mouth 3 (three) times daily.  . isosorbide mononitrate (IMDUR) 30 MG 24 hr tablet Take 30 mg by mouth at bedtime.  Marland Kitchen levothyroxine (SYNTHROID, LEVOTHROID) 50 MCG tablet Take 50 mcg by mouth daily before breakfast.  . Melatonin 5 MG CAPS Take 5 mg by mouth at bedtime as needed (for sleep).  . metoprolol (LOPRESSOR) 100 MG tablet Take 1 tablet (100 mg total) by mouth 2 (two) times daily.  . pantoprazole sodium (PROTONIX) 40 mg/20 mL PACK Place 40 mg into feeding tube as needed.  . polyethylene glycol (MIRALAX / GLYCOLAX) packet Take 17 g by mouth daily as needed for moderate constipation.  . potassium chloride (K-DUR,KLOR-CON) 10 MEQ tablet  Take 10 mEq by mouth every other day.  . spironolactone (ALDACTONE) 25 MG tablet Take 1 tablet (25 mg total) by mouth daily.   No facility-administered encounter medications on file as of 05/24/2016.     Allergies (verified) Iohexol; Iodinated diagnostic agents; and Z-pak [azithromycin]   History: Past Medical History:  Diagnosis Date  . AAA (abdominal aortic aneurysm) ([place])   . Acute upper respiratory infections of unspecified site   . Anemia   . Anxiety   . Aortic aneurysm of unspecified site without mention of rupture   . Arthritis   . Atrial fibrillation ([place])   . Cervicalgia   . Cholelithiasis   . Chronic systolic heart failure ([place])   . CKD (chronic kidney disease)   . Colon cancer ([place]) 07/1997  . Depression   . Diabetes mellitus ([place])   . Dizziness and giddiness   . Electrolyte and fluid disorders not elsewhere classified   . GERD (gastroesophageal reflux disease)   . Gout, unspecified   . Hemorrhage of rectum and anus   . Hypercholesteremia   . Hypertension   . Hypoglycemia, unspecified   . Hypopotassemia   . Hypothyroidism   . Kidney stone    renal calculi  . Malignant neoplasm of colon, unspecified site   . Other chronic allergic conjunctivitis   . Other specified cardiac dysrhythmias(427.89)   . Pain  in joint, lower leg   . Perforation of bile duct   . Proteinuria   . Shortness of breath   . Small bowel obstruction due to adhesions 05/16/2012  . Stroke ([place])   . Swelling, mass, or lump in head and neck   . Thoracic aneurysm without mention of rupture    4.6 cm Asc Aortic aneurysm, CT 07/2015   Past Surgical History:  Procedure Laterality Date  . CARDIAC CATHETERIZATION  2008   moderate severe pulm HTN; with elevted pulm capillary wedge pressure   . CHOLECYSTECTOMY  05/14/2012   Procedure: LAPAROSCOPIC CHOLECYSTECTOMY WITH INTRAOPERATIVE CHOLANGIOGRAM;  Surgeon: Haywood Lasso, MD;  Location: [facility];  Service: General;  Laterality: N/A;  . COLON  SURGERY     to remove colon ca  . ERCP  05/17/2012   Procedure: ENDOSCOPIC RETROGRADE CHOLANGIOPANCREATOGRAPHY (ERCP);  Surgeon: Missy Sabins, MD;  Location: [facility];  Service: Gastroenterology;  Laterality: N/A;  . ERCP  08/12/2012   Procedure: ENDOSCOPIC RETROGRADE CHOLANGIOPANCREATOGRAPHY (ERCP);  Surgeon: Missy Sabins, MD;  Location: [facility] END[facility]COPY;  Service: Endoscopy;  Laterality: N/A;  . HEMICOLECTOMY  08/11/1997  . KIDNEY STONE SURGERY    . TRANSTHORACIC ECHOCARDIOGRAM  11/2009   EF 45-50%, mild-mod AV regurg; mild MR; mod TR; ascending aorta mildly dilated   Family History  Problem Relation Age of Onset  . Heart disease Mother   . Stroke Father   . Hypertension Daughter   . Hypertension Son   . Diabetes Sister   . Hypertension Son    Social History   Occupational History  . Not on file.   Social History Main Topics  . Smoking status: Never Smoker  . Smokeless tobacco: Never Used  . Alcohol use No  . Drug use: No  . Sexual activity: No    Tobacco Counseling Counseling given: No   Activities of Daily Living In your present state of health, do you have any difficulty performing the following activities: 05/24/2016 04/10/2016  Hearing? N N  Vision? N N  Difficulty concentrating or making decisions? [name]  Walking or climbing stairs? N Y  Dressing or bathing? N N  Doing errands, shopping? N Y  Conservation officer, nature and eating ? N -  Using the Toilet? N -  In the past six months, have you accidently leaked urine? Y -  Do you have problems with loss of bowel control? N -  Managing your Medications? N -  Managing your Finances? N -  Housekeeping or managing your Housekeeping? N -  Some recent data might be hidden    Immunizations and Health Maintenance Immunization History  Administered Date(s) Administered  . Influenza Split 05/14/2012  . Influenza,inj,Quad PF,36+ Mos 04/23/2013, 04/05/2015, 05/02/2015, 05/24/2016  . PPD Test 05/02/2015  . Pneumococcal Conjugate-13  10/22/2014  . Pneumococcal Polysaccharide-23 09/17/2011  . Tdap 09/17/2011   Health Maintenance Due  Topic Date Due  . OPHTHALMOLOGY EXAM  03/22/1941  . ZOSTAVAX  03/21/1993  . INFLUENZA VACCINE  02/21/2016    Patient Care Team: [name], DO as PCP - General (Geriatric Medicine) Neldon Mc, MD as Consulting Physician (General Surgery) Pixie Casino, MD as Consulting Physician (Cardiology) Maia Breslow, MD as Consulting Physician (Orthopedic Surgery) Rutherford Guys, MD as Consulting Physician (Ophthalmology)  Indicate any recent Medical Services you may have received from other than [facility] providers in the past year (date may be approximate).     Assessment:   This is a routine wellness examination for  Pamala Hurry.   Hearing/Vision screen Hearing Screening Comments: Unsure of last hearing exam. Due for one.  Vision Screening Comments: Last eye exam done October 2017.  Dietary issues and exercise activities discussed: Current Exercise Habits: The patient does not participate in regular exercise at present, Exercise limited by: None identified  Goals    . Increase water intake          Starting 05/24/16, I will attempt to increase my water intake by 3 more glasses of water per day.       Depression Screen PHQ 2/9 Scores 05/24/2016 01/20/2016 07/01/2015 06/10/2014 04/14/2013 03/25/2013 01/29/2013  PHQ - 2 Score 1 0 0 0 0 0 0    Fall Risk Fall Risk  05/24/2016 03/14/2016 01/20/2016 08/25/2015 08/15/2015  Falls in the past year? No No No No No  Number falls in past yr: - - - - -  Injury with Fall? - - - - -    Cognitive Function: MMSE - Mini Mental State Exam 05/24/2016 06/09/2015 04/23/2013  Not completed: - (No Data) -  Orientation to time 0 0 5  Orientation to Place 4 0 5  Registration 3 3 3   Attention/ Calculation 4 5 5   Recall 3 0 2  Language- name 2 objects 2 2 2   Language- repeat 1 1 1   Language- follow 3 step command 2 3 3   Language- read & follow direction 0 1 1    Write a sentence 1 1 1   Copy design 0 0 1  Total score 20 16 29         Screening Tests Health Maintenance  Topic Date Due  . OPHTHALMOLOGY EXAM  03/22/1941  . ZOSTAVAX  03/21/1993  . INFLUENZA VACCINE  02/21/2016  . URINE MICROALBUMIN  06/30/2016  . HEMOGLOBIN A1C  07/17/2016  . MAMMOGRAM  09/08/2016  . FOOT EXAM  05/24/2017  . TETANUS/TDAP  09/16/2021  . DEXA SCAN  Completed  . PNA vac Low Risk Adult  Completed      Plan:    I have personally reviewed and addressed the Medicare Annual Wellness questionnaire and have noted the following in the patient's chart:  A. Medical and social history B. Use of alcohol, tobacco or illicit drugs  C. Current medications and supplements D. Functional ability and status E.  Nutritional status F.  Physical activity G. Advance directives H. List of other physicians I.  Hospitalizations, surgeries, and ER visits in previous 12 months J.  [place] to include hearing, vision, cognitive, depression L. Referrals and appointments - none  In addition, I have reviewed and discussed with patient certain preventive protocols, quality metrics, and best practice recommendations. A written personalized care plan for preventive services as well as general preventive health recommendations were provided to patient.  See attached scanned questionnaire for additional information.   Signed,   Allyn Kenner, LPN Health Advisor   I reviewed health advisor's note, was available for consultation and agree with the assessment and plan as written.    Nahdia Doucet L. Lety Cullens, D.O. [facility] [address], [place] [address] Cell Phone (Mon-Fri 8am-5pm):  [phone] On Call:  [phone] & follow prompts after 5pm & weekends Office Phone:  [phone] Office Fax:  [phone]

## 2016-05-30 ENCOUNTER — Other Ambulatory Visit: Payer: Self-pay | Admitting: Internal Medicine

## 2016-06-06 ENCOUNTER — Other Ambulatory Visit: Payer: Self-pay | Admitting: Internal Medicine

## 2016-06-11 ENCOUNTER — Telehealth: Payer: Self-pay

## 2016-06-11 NOTE — Telephone Encounter (Signed)
A fax was received from [employer] that is requesting a detailed written physician order/prior authorization request for home blood glucose testing supplies.  I called patient to see if she actually uses this company for her diabetic supplies. I left a message for patient to call the office.

## 2016-06-25 ENCOUNTER — Other Ambulatory Visit: Payer: Self-pay | Admitting: *Deleted

## 2016-06-25 MED ORDER — LEVOTHYROXINE SODIUM 50 MCG PO TABS: 50.0000 ug | ORAL_TABLET | Freq: Every day | ORAL | 1 refills | Status: DC

## 2016-07-20 ENCOUNTER — Emergency Department (HOSPITAL_COMMUNITY)
Admission: EM | Admit: 2016-07-20 | Discharge: 2016-07-20 | Disposition: A | Discharge status: Home / Self Care | Payer: Medicare Other | Attending: Emergency Medicine | Admitting: Emergency Medicine

## 2016-07-20 ENCOUNTER — Emergency Department (HOSPITAL_COMMUNITY): Payer: Medicare Other

## 2016-07-20 ENCOUNTER — Encounter (HOSPITAL_COMMUNITY): Payer: Self-pay | Admitting: *Deleted

## 2016-07-20 DIAGNOSIS — N189 Chronic kidney disease, unspecified: Secondary | ICD-10-CM | POA: Diagnosis not present

## 2016-07-20 DIAGNOSIS — R42 Dizziness and giddiness: Secondary | ICD-10-CM

## 2016-07-20 DIAGNOSIS — I5043 Acute on chronic combined systolic (congestive) and diastolic (congestive) heart failure: Secondary | ICD-10-CM | POA: Diagnosis not present

## 2016-07-20 DIAGNOSIS — E1122 Type 2 diabetes mellitus with diabetic chronic kidney disease: Secondary | ICD-10-CM | POA: Insufficient documentation

## 2016-07-20 DIAGNOSIS — R531 Weakness: Secondary | ICD-10-CM | POA: Diagnosis present

## 2016-07-20 DIAGNOSIS — Z79899 Other long term (current) drug therapy: Secondary | ICD-10-CM | POA: Diagnosis not present

## 2016-07-20 DIAGNOSIS — Z7982 Long term (current) use of aspirin: Secondary | ICD-10-CM | POA: Diagnosis not present

## 2016-07-20 DIAGNOSIS — R55 Syncope and collapse: Secondary | ICD-10-CM | POA: Diagnosis not present

## 2016-07-20 DIAGNOSIS — E039 Hypothyroidism, unspecified: Secondary | ICD-10-CM | POA: Diagnosis not present

## 2016-07-20 DIAGNOSIS — Z85038 Personal history of other malignant neoplasm of large intestine: Secondary | ICD-10-CM | POA: Diagnosis not present

## 2016-07-20 DIAGNOSIS — I130 Hypertensive heart and chronic kidney disease with heart failure and stage 1 through stage 4 chronic kidney disease, or unspecified chronic kidney disease: Secondary | ICD-10-CM | POA: Insufficient documentation

## 2016-07-20 LAB — RAPID URINE DRUG SCREEN, HOSP PERFORMED
AMPHETAMINES: NOT DETECTED
BENZODIAZEPINES: NOT DETECTED
Barbiturates: NOT DETECTED
COCAINE: NOT DETECTED
Opiates: NOT DETECTED
Tetrahydrocannabinol: NOT DETECTED

## 2016-07-20 LAB — TROPONIN I: Troponin I: <0.03 ng/mL (ref <0.03)

## 2016-07-20 LAB — COMPREHENSIVE METABOLIC PANEL
ALBUMIN: 3.3 g/dL — AB (ref 3.5–5.0)
ALT: 13 U/L — ABNORMAL LOW (ref 14–54)
AST: 17 U/L (ref 15–41)
Alkaline Phosphatase: 79 U/L (ref 38–126)
Anion gap: 7 (ref 5–15)
BILIRUBIN TOTAL: 0.4 mg/dL (ref 0.3–1.2)
BUN: 20 mg/dL (ref 6–20)
CO2: 24 mmol/L (ref 22–32)
CREATININE: 1.45 mg/dL — AB (ref 0.44–1.00)
Calcium: 9 mg/dL (ref 8.9–10.3)
Chloride: 109 mmol/L (ref 101–111)
GFR calc Af Amer: 37 mL/min — ABNORMAL LOW (ref >60)
GFR, EST NON AFRICAN AMERICAN: 32 mL/min — AB (ref >60)
GLUCOSE: 113 mg/dL — AB (ref 65–99)
POTASSIUM: 4.2 mmol/L (ref 3.5–5.1)
Sodium: 140 mmol/L (ref 135–145)
TOTAL PROTEIN: 7.2 g/dL (ref 6.5–8.1)

## 2016-07-20 LAB — URINALYSIS, ROUTINE W REFLEX MICROSCOPIC
Bilirubin Urine: NEGATIVE
GLUCOSE, UA: NEGATIVE mg/dL
HGB URINE DIPSTICK: NEGATIVE
KETONES UR: NEGATIVE mg/dL
LEUKOCYTES UA: NEGATIVE
Nitrite: NEGATIVE
PH: 5 (ref 5.0–8.0)
Protein, ur: NEGATIVE mg/dL
Specific Gravity, Urine: 1.013 (ref 1.005–1.030)

## 2016-07-20 LAB — CBC WITH DIFFERENTIAL/PLATELET
Basophils Absolute: 0 10*3/uL (ref 0.0–0.1)
Basophils Relative: 0 %
EOS PCT: 1 %
Eosinophils Absolute: 0.1 10*3/uL (ref 0.0–0.7)
HEMATOCRIT: 33.4 % — AB (ref 36.0–46.0)
Hemoglobin: 11.6 g/dL — ABNORMAL LOW (ref 12.0–15.0)
LYMPHS ABS: 1.3 10*3/uL (ref 0.7–4.0)
Lymphocytes Relative: 21 %
MCH: 23.9 pg — AB (ref 26.0–34.0)
MCHC: 34.7 g/dL (ref 30.0–36.0)
MCV: 68.7 fL — AB (ref 78.0–100.0)
MONOS PCT: 9 %
Monocytes Absolute: 0.6 10*3/uL (ref 0.1–1.0)
NEUTROS ABS: 4.2 10*3/uL (ref 1.7–7.7)
Neutrophils Relative %: 69 %
Platelets: 285 10*3/uL (ref 150–400)
RBC: 4.86 MIL/uL (ref 3.87–5.11)
RDW: 18.8 % — AB (ref 11.5–15.5)
WBC: 6.2 10*3/uL (ref 4.0–10.5)

## 2016-07-20 LAB — PROTIME-INR
INR: 1.12
Prothrombin Time: 14.5 seconds (ref 11.4–15.2)

## 2016-07-20 LAB — LIPASE, BLOOD: Lipase: 54 U/L — ABNORMAL HIGH (ref 11–51)

## 2016-07-20 LAB — CBG MONITORING, ED: Glucose-Capillary: 117 mg/dL — ABNORMAL HIGH (ref 65–99)

## 2016-07-20 MED ORDER — ONDANSETRON HCL 4 MG/2ML IJ SOLN
4.0000 mg | Freq: Once | INTRAMUSCULAR | Status: AC
  Administered 2016-07-20: 4 mg via INTRAVENOUS
  Filled 2016-07-20: qty 2

## 2016-07-20 MED ORDER — NALOXONE HCL 0.4 MG/ML IJ SOLN
0.4000 mg | Freq: Once | INTRAMUSCULAR | Status: AC
  Administered 2016-07-20: 0.4 mg via INTRAVENOUS
  Filled 2016-07-20: qty 1

## 2016-07-20 MED ORDER — SODIUM CHLORIDE 0.9 % IV BOLUS (SEPSIS)
500.0000 mL | Freq: Once | INTRAVENOUS | Status: AC
  Administered 2016-07-20: 500 mL via INTRAVENOUS

## 2016-07-20 NOTE — ED Notes (Signed)
Pt ambulatory to the restroom with no difficulty

## 2016-07-20 NOTE — ED Notes (Signed)
Patient transported to ct

## 2016-07-20 NOTE — ED Provider Notes (Signed)
10:45 PM Patient reassessed. She is alert and resting comfortably. She has no complaints and states that she feels better. She has ambulated to the bathroom 2 without difficulty. She is requesting some water and graham crackers. Vital signs have been stable and laboratory workup is reassuring. History suggests that symptoms may be secondary to a mild vasovagal response. I do not see indication for further emergent workup for admission. Patient is comfortable with discharge and outpatient follow-up. Return precautions provided. Patient discharged in satisfactory condition with no unaddressed concerns.   Results for orders placed or performed during the hospital encounter of 07/20/16  Comprehensive metabolic panel  Result Value Ref Range   Sodium 140 135 - 145 mmol/L   Potassium 4.2 3.5 - 5.1 mmol/L   Chloride 109 101 - 111 mmol/L   CO2 24 22 - 32 mmol/L   Glucose, Bld 113 (H) 65 - 99 mg/dL   BUN 20 6 - 20 mg/dL   Creatinine, Ser 1.45 (H) 0.44 - 1.00 mg/dL   Calcium 9.0 8.9 - 10.3 mg/dL   Total Protein 7.2 6.5 - 8.1 g/dL   Albumin 3.3 (L) 3.5 - 5.0 g/dL   AST 17 15 - 41 U/L   ALT 13 (L) 14 - 54 U/L   Alkaline Phosphatase 79 38 - 126 U/L   Total Bilirubin 0.4 0.3 - 1.2 mg/dL   GFR calc non Af Amer 32 (L) >60 mL/min   GFR calc Af Amer 37 (L) >60 mL/min   Anion gap 7 5 - 15  Lipase, blood  Result Value Ref Range   Lipase 54 (H) 11 - 51 U/L  Urinalysis, Routine w reflex microscopic  Result Value Ref Range   Color, Urine YELLOW YELLOW   APPearance CLEAR CLEAR   Specific Gravity, Urine 1.013 1.005 - 1.030   pH 5.0 5.0 - 8.0   Glucose, UA NEGATIVE NEGATIVE mg/dL   Hgb urine dipstick NEGATIVE NEGATIVE   Bilirubin Urine NEGATIVE NEGATIVE   Ketones, ur NEGATIVE NEGATIVE mg/dL   Protein, ur NEGATIVE NEGATIVE mg/dL   Nitrite NEGATIVE NEGATIVE   Leukocytes, UA NEGATIVE NEGATIVE  Troponin I  Result Value Ref Range   Troponin I <0.03 <0.03 ng/mL  Protime-INR  Result Value Ref Range   Prothrombin Time 14.5 11.4 - 15.2 seconds   INR 1.12   Rapid urine drug screen (hospital performed)  Result Value Ref Range   Opiates NONE DETECTED NONE DETECTED   Cocaine NONE DETECTED NONE DETECTED   Benzodiazepines NONE DETECTED NONE DETECTED   Amphetamines NONE DETECTED NONE DETECTED   Tetrahydrocannabinol NONE DETECTED NONE DETECTED   Barbiturates NONE DETECTED NONE DETECTED  CBC with Differential  Result Value Ref Range   WBC 6.2 4.0 - 10.5 K/uL   RBC 4.86 3.87 - 5.11 MIL/uL   Hemoglobin 11.6 (L) 12.0 - 15.0 g/dL   HCT 33.4 (L) 36.0 - 46.0 %   MCV 68.7 (L) 78.0 - 100.0 fL   MCH 23.9 (L) 26.0 - 34.0 pg   MCHC 34.7 30.0 - 36.0 g/dL   RDW 18.8 (H) 11.5 - 15.5 %   Platelets 285 150 - 400 K/uL   Neutrophils Relative % 69 %   Lymphocytes Relative 21 %   Monocytes Relative 9 %   Eosinophils Relative 1 %   Basophils Relative 0 %   Neutro Abs 4.2 1.7 - 7.7 K/uL   Lymphs Abs 1.3 0.7 - 4.0 K/uL   Monocytes Absolute 0.6 0.1 - 1.0 K/uL   Eosinophils  Absolute 0.1 0.0 - 0.7 K/uL   Basophils Absolute 0.0 0.0 - 0.1 K/uL   RBC Morphology ELLIPTOCYTES   CBG monitoring, ED  Result Value Ref Range   Glucose-Capillary 117 (H) 65 - 99 mg/dL   Ct Head Wo Contrast  Result Date: 07/20/2016 CLINICAL DATA:  Altered mental status. EXAM: CT HEAD WITHOUT CONTRAST TECHNIQUE: Contiguous axial images were obtained from the base of the skull through the vertex without intravenous contrast. COMPARISON:  CT scan of March 14, 2016. FINDINGS: Brain: No evidence of acute infarction, hemorrhage, hydrocephalus, extra-axial collection or mass lesion/mass effect. Mild chronic ischemic white matter disease is noted. Minimal diffuse cortical atrophy is noted. Vascular: Atherosclerosis of carotid siphons is noted. Skull: Normal. Negative for fracture or focal lesion. Sinuses/Orbits: No acute finding. Other: None. IMPRESSION: Minimal diffuse cortical atrophy. Mild chronic ischemic white matter disease. No acute  intracranial abnormality seen. Electronically Signed   By: Marijo Conception, M.D.   On: 07/20/2016 19:52      Antonietta Breach, PA-C 07/20/16 2300    Duffy Bruce, MD 07/22/16 1012

## 2016-07-20 NOTE — ED Notes (Signed)
Pt returned from CT and connected to the monitor 

## 2016-07-20 NOTE — ED Provider Notes (Signed)
[facility] DEPT Provider Note   CSN: [id number] Arrival date & time: 07/20/16  1807     History   Chief Complaint Chief Complaint  Patient presents with  . Weakness    HPI   There were no vitals taken for this visit.  Nicole [name] is a 82 y.o. female with past medical history significant for A. fib (not anticoagulated but does take rate control), CHF, CK D, diabetes, hypertension, high cholesterol, CVA, AAA complaining of feeling nauseous and lightheaded like she was given a pass out while visiting her son in the ICU. Nursing note states that this patient throughout 6 times however patient denies this. She denies chest pain, palpitations, headache, abdominal pain, fever, chills, decreased by mouth intake. States that she takes a prescription pain medicine but cannot remember the name of the pain medication or why she takes it. She states that she feels foggy and out of sorts. Denies any recent change in her medications.  Patient's fianc has come to the ED to visit her recent [place] the history states that she was visiting her son in the ICU when she was very upset about the state of his health started gagging and the nurses on the ICU became concerned and encouraged her to present to the ED, he states that she just needed some time and she would've been fine.  Past Medical History:  Diagnosis Date  . AAA (abdominal aortic aneurysm) ([place])   . Acute upper respiratory infections of unspecified site   . Anemia   . Anxiety   . Aortic aneurysm of unspecified site without mention of rupture   . Arthritis   . Atrial fibrillation ([place])   . Cervicalgia   . Cholelithiasis   . Chronic systolic heart failure ([place])   . CKD (chronic kidney disease)   . Colon cancer ([place]) 07/1997  . Depression   . Diabetes mellitus ([place])   . Dizziness and giddiness   . Electrolyte and fluid disorders not elsewhere classified   . GERD (gastroesophageal reflux disease)   . Gout, unspecified   .  Hemorrhage of rectum and anus   . Hypercholesteremia   . Hypertension   . Hypoglycemia, unspecified   . Hypopotassemia   . Hypothyroidism   . Kidney stone    renal calculi  . Malignant neoplasm of colon, unspecified site   . Other chronic allergic conjunctivitis   . Other specified cardiac dysrhythmias(427.89)   . Pain in joint, lower leg   . Perforation of bile duct   . Proteinuria   . Shortness of breath   . Small bowel obstruction due to adhesions 05/16/2012  . Stroke ([place])   . Swelling, mass, or lump in head and neck   . Thoracic aneurysm without mention of rupture    4.6 cm Asc Aortic aneurysm, CT 07/2015    Patient Active Problem List   Diagnosis Date Noted  . Abdominal pain, epigastric 01/07/2016  . Accelerated hypertension with diastolic congestive heart failure, NYHA class 3 ([place]) 01/05/2016  . Atrial fibrillation ([place]) 01/05/2016  . Rib pain on right side 10/14/2015  . Hypothyroidism due to acquired atrophy of thyroid 10/14/2015  . Type 2 diabetes mellitus with diabetic nephropathy, without long-term current use of insulin ([place]) 10/14/2015  . Essential hypertension 10/14/2015  . Acute on chronic systolic heart failure ([place]) 08/02/2015  . Acute on chronic diastolic heart failure ([place]) 08/02/2015  . Bradycardia 07/27/2015  . Chronic anemia 07/27/2015  . Chest pain   . Vascular  dementia 06/13/2015  . Memory loss 06/13/2015  . Left hemiparesis ([place])   . Chronic diastolic congestive heart failure ([place])   . Persistent atrial fibrillation ([place])   . Type 2 diabetes mellitus with peripheral neuropathy (H[facility])   . ICH (intracerebral hemorrhage) ([place]) 04/04/2015  . Chronic atrial fibrillation ([place])   . Hypertensive urgency   . SOB (shortness of breath) 04/03/2014  . Epistaxis 02/15/2014  . SBO (small bowel obstruction) 01/23/2014  . Diabetic nephropathy ([place]) 09/07/2013  . Chronic constipation 03/25/2013  . Carpal tunnel syndrome 02/25/2013  . Hypothyroidism   . Gout  flare 06/01/2012  . Leakage of common bile duct s/p ERCP / stent 05/30/2012  . Ileus post op. still NPO 05/22/2012  . Bile leak, postoperative 05/17/2012  . Hypokalemia 05/17/2012  . Atrial fibrillation with RVR ([place]) 05/16/2012  . Postoperative ileus ([place]) 05/16/2012  . Small bowel obstruction due to adhesions 05/16/2012  . Acute on chronic renal insufficiency 05/15/2012  . Normal coronary arteries, 2008 05/15/2012  . Type II or unspecified type diabetes mellitus with neurological manifestations, not stated as uncontrolled(250.60) 05/15/2012  . HTN (hypertension) 05/15/2012  . Respiratory distress post op secondary to rapid AF 05/15/2012  . ARF (acute renal failure) ([place]) 05/15/2012  . Abdominal pain, acute, right upper quadrant 05/12/2012  . Nausea and vomiting 05/12/2012  . Dehydration 05/12/2012  . Cholecystitis chronic, acute, S/P lap cholecystectomy 05/14/12 05/12/2012  . Thoracic aortic aneurysm, 4.8cm 2011   . CHF, past NICM with an EF of 30%, most recent echo 2011 showed EF to be45- 50%   . Anemia 08/09/2011  . History of colon cancer, stage III 08/09/2011    Past Surgical History:  Procedure Laterality Date  . CARDIAC CATHETERIZATION  2008   moderate severe pulm HTN; with elevted pulm capillary wedge pressure   . CHOLECYSTECTOMY  05/14/2012   Procedure: LAPAROSCOPIC CHOLECYSTECTOMY WITH INTRAOPERATIVE CHOLANGIOGRAM;  Surgeon: Haywood Lasso, MD;  Location: [facility];  Service: General;  Laterality: N/A;  . COLON SURGERY     to remove colon ca  . ERCP  05/17/2012   Procedure: ENDOSCOPIC RETROGRADE CHOLANGIOPANCREATOGRAPHY (ERCP);  Surgeon: Missy Sabins, MD;  Location: [facility];  Service: Gastroenterology;  Laterality: N/A;  . ERCP  08/12/2012   Procedure: ENDOSCOPIC RETROGRADE CHOLANGIOPANCREATOGRAPHY (ERCP);  Surgeon: Missy Sabins, MD;  Location[facility] ENDOSCOPY;  Service: Endoscopy;  Laterality: N/A;  . HEMICOLECTOMY  08/11/1997  . KIDNEY STONE SURGERY    . TRANSTHORACIC  ECHOCARDIOGRAM  11/2009   EF 45-50%, mild-mod AV regurg; mild MR; mod TR; ascending aorta mildly dilated    OB History    No data available       Home Medications    Prior to Admission medications   Medication Sig Start Date End Date Taking? Authorizing Provider  allopurinol (ZYLOPRIM) 100 MG tablet Take 100 mg by mouth daily.    Historical Provider, MD  aspirin EC 81 MG tablet Take 81 mg by mouth daily.    Historical Provider, MD  butalbital-acetaminophen-caffeine (FIORICET, ESGIC) [phone] MG tablet Take 1 tablet by mouth every 6 (six) hours as needed for headache. 01/08/16   Lavina Hamman, MD  cloNIDine (CATAPRES) 0.2 MG tablet Take 0.2 mg by mouth 3 (three) times daily.    Historical Provider, MD  cloNIDine (CATAPRES) 0.2 MG tablet take 1 tablet by mouth three times a day for hypertension 06/07/16   [name] [name], DO  donepezil (ARICEPT) 10 MG tablet Take 1 tablet (10 mg total)  by mouth at bedtime. 01/09/16   [name] [name], DO  furosemide (LASIX) 20 MG tablet Take 20 mg by mouth every other day. Pt takes one additional tablet daily for weight gain greater than 3 lbs.     Historical Provider, MD  hydrALAZINE (APRESOLINE) 50 MG tablet Take 1 tablet (50 mg total) by mouth 3 (three) times daily. 04/12/16   Costin Karlyne Greenspan, MD  isosorbide mononitrate (IMDUR) 30 MG 24 hr tablet Take 30 mg by mouth at bedtime.    Historical Provider, MD  levothyroxine (SYNTHROID, LEVOTHROID) 50 MCG tablet Take 1 tablet (50 mcg total) by mouth daily before breakfast. 06/25/16   [name] [name], DO  Melatonin 5 MG CAPS Take 5 mg by mouth at bedtime as needed (for sleep).    Historical Provider, MD  metoprolol (LOPRESSOR) 100 MG tablet Take 1 tablet (100 mg total) by mouth 2 (two) times daily. 01/20/16   [name] [name], DO  pantoprazole sodium (PROTONIX) 40 mg/20 mL PACK Place 40 mg into feeding tube as needed.    Historical Provider, MD  polyethylene glycol (MIRALAX / GLYCOLAX) packet Take 17 g by mouth daily as  needed for moderate constipation. 01/20/16   [name] [name], DO  potassium chloride (K-DUR) 10 MEQ tablet Take 1 tablet (10 mEq total) by mouth every other day. 06/07/16   [name] [name], DO  potassium chloride (K-DUR,KLOR-CON) 10 MEQ tablet Take 10 mEq by mouth every other day.    Historical Provider, MD  spironolactone (ALDACTONE) 25 MG tablet Take 1 tablet (25 mg total) by mouth daily. 01/20/16   [name], DO    Family History Family History  Problem Relation Age of Onset  . Heart disease Mother   . Stroke Father   . Hypertension Daughter   . Hypertension Son   . Diabetes Sister   . Hypertension Son     Social History Social History  Substance Use Topics  . Smoking status: Never Smoker  . Smokeless tobacco: Never Used  . Alcohol use No     Allergies   Iohexol; Iodinated diagnostic agents; and Z-pak [azithromycin]   Review of Systems Review of Systems  10 systems reviewed and found to be negative, except as noted in the HPI.   Physical Exam Updated Vital Signs BP 148/91   Pulse 77   Temp 97.7 F (36.5 C) (Oral)   Resp 15   SpO2 97%   Physical Exam  Constitutional: She is oriented to person, place, and time. She appears well-developed and well-nourished. No distress.  HENT:  Head: Normocephalic and atraumatic.  Mouth/Throat: Oropharynx is clear and moist.  Eyes: Conjunctivae and EOM are normal. Pupils are equal, round, and reactive to light.  Pinpoint and minimally reactive pupils bilaterally  Neck: Normal range of motion.  Cardiovascular: Normal rate and intact distal pulses.   Pulmonary/Chest: Effort normal and breath sounds normal. No respiratory distress. She has no rales. She exhibits no tenderness.  Abdominal: Soft. There is no tenderness.  Musculoskeletal: Normal range of motion.  Neurological: She is alert and oriented to person, place, and time.  III/IV/VI-Extraocular movements intact.   V/VII-Smile symmetric, equal eyebrow raise,  facial  sensation intact VIII- Hearing grossly intact IX/X-Normal gag XI-bilateral shoulder shrug XII-midline tongue extension Motor: 5/5 bilaterally with normal tone and bulk    Skin: Capillary refill takes less than 2 seconds. She is not diaphoretic.  Psychiatric: She has a normal mood and affect.  Nursing note and vitals reviewed.  ED Treatments / Results  Labs (all labs ordered are listed, but only abnormal results are displayed) Labs Reviewed  COMPREHENSIVE METABOLIC PANEL - Abnormal; Notable for the following:       Result Value   Glucose, Bld 113 (*)    Creatinine, Ser 1.45 (*)    Albumin 3.3 (*)    ALT 13 (*)    GFR calc non Af Amer 32 (*)    GFR calc Af Amer 37 (*)    All other components within normal limits  LIPASE, BLOOD - Abnormal; Notable for the following:    Lipase 54 (*)    All other components within normal limits  CBG MONITORING, ED - Abnormal; Notable for the following:    Glucose-Capillary 117 (*)    All other components within normal limits  TROPONIN I  PROTIME-INR  CBC WITH DIFFERENTIAL/PLATELET  URINALYSIS, ROUTINE W REFLEX MICROSCOPIC  RAPID URINE DRUG SCREEN, HOSP PERFORMED    EKG  EKG Interpretation None       Radiology Ct Head Wo Contrast  Result Date: 07/20/2016 CLINICAL DATA:  Altered mental status. EXAM: CT HEAD WITHOUT CONTRAST TECHNIQUE: Contiguous axial images were obtained from the base of the skull through the vertex without intravenous contrast. COMPARISON:  CT scan of March 14, 2016. FINDINGS: Brain: No evidence of acute infarction, hemorrhage, hydrocephalus, extra-axial collection or mass lesion/mass effect. Mild chronic ischemic white matter disease is noted. Minimal diffuse cortical atrophy is noted. Vascular: Atherosclerosis of carotid siphons is noted. Skull: Normal. Negative for fracture or focal lesion. Sinuses/Orbits: No acute finding. Other: None. IMPRESSION: Minimal diffuse cortical atrophy. Mild chronic ischemic white  matter disease. No acute intracranial abnormality seen. Electronically Signed   By: Marijo Conception, M.D.   On: 07/20/2016 19:52    Procedures Procedures (including critical care time)  Medications Ordered in ED Medications  sodium chloride 0.9 % bolus 500 mL (500 mLs Intravenous New Bag/Given 07/20/16 1909)  ondansetron (ZOFRAN) injection 4 mg (4 mg Intravenous Given 07/20/16 1906)  naloxone [facility]) injection 0.4 mg (0.4 mg Intravenous Given 07/20/16 1943)     Initial Impression / Assessment and Plan / ED Course  I have reviewed the triage vital signs and the nursing notes.  Pertinent labs & imaging results that were available during my care of the patient were reviewed by me and considered in my medical decision making (see chart for details).  Clinical Course     Vitals:   07/20/16 1900 07/20/16 1901 07/20/16 1915 07/20/16 1945  BP: 138/70 115/57 139/90 148/91  Pulse: 67 (!) 52 62 77  Resp: 20 14 15    Temp:  97.7 F (36.5 C)    TempSrc:  Oral    SpO2: 96% 96% 97% 97%    Medications  sodium chloride 0.9 % bolus 500 mL (500 mLs Intravenous New Bag/Given 07/20/16 1909)  ondansetron (ZOFRAN) injection 4 mg (4 mg Intravenous Given 07/20/16 1906)  naloxone [facility]) injection 0.4 mg (0.4 mg Intravenous Given 07/20/16 1943)    Nicole [name] is 82 y.o. female presenting with Feeling woozy lightheaded and slightly slow to respond. Neurologic exam is nonfocal, pupils are pinpoint. No change was noted after Narcan administration, head CT negative.  Case signed out to PA Humes at shift change: Plan is to follow-up remaining blood work, UA and discharged to the care of her fianc for everything comes back negative   Final Clinical Impressions(s) / ED Diagnoses   Final diagnoses:  None    New Prescriptions  New Prescriptions   No medications on file     Monico Blitz, PA-C 07/20/16 2024    Duffy Bruce, MD 07/22/16 1012

## 2016-07-20 NOTE — Discharge Instructions (Signed)
Follow up with your primary care doctor regarding your ED visit. Return for new or concerning symptoms.

## 2016-07-20 NOTE — ED Triage Notes (Signed)
To ED for eval of nausea and vomiting while visiting son who is in ICU. Pt seems slow to answer questions. Denies pain or sob. States she feels weak. States she vomited 6 times while visiting son. No family with pt in triage for further story.

## 2016-07-27 ENCOUNTER — Ambulatory Visit (INDEPENDENT_AMBULATORY_CARE_PROVIDER_SITE_OTHER): Payer: Medicare Other | Admitting: Internal Medicine

## 2016-07-27 ENCOUNTER — Encounter: Payer: Self-pay | Admitting: Internal Medicine

## 2016-07-27 VITALS — BP 157/82 | HR 71 | Ht 68.0 in | Wt 157.0 lb

## 2016-07-27 DIAGNOSIS — I712 Thoracic aortic aneurysm, without rupture, unspecified: Secondary | ICD-10-CM

## 2016-07-27 DIAGNOSIS — I10 Essential (primary) hypertension: Secondary | ICD-10-CM

## 2016-07-27 DIAGNOSIS — I482 Chronic atrial fibrillation: Secondary | ICD-10-CM | POA: Diagnosis not present

## 2016-07-27 DIAGNOSIS — I4821 Permanent atrial fibrillation: Secondary | ICD-10-CM

## 2016-07-27 DIAGNOSIS — I5032 Chronic diastolic (congestive) heart failure: Secondary | ICD-10-CM | POA: Diagnosis not present

## 2016-07-27 NOTE — Progress Notes (Signed)
OFFICE NOTE  Chief Complaint:  Routine follow-up  Primary Care Physician: Hollace Kinnier, DO  HPI:  Nicole [name] is a 83 year old female with history of chronic atrial fibrillation and controlled ventricular response. She was previously followed by Dr. Rex Kras, has a history of congestive heart failure with an EF of about 45% and moderate MR in the past. She has a thoracic aneurysm which has been asymptomatic, as well as hypertension, diabetes, dyslipidemia, and recently was admitted for gallbladder surgery which was complicated by bleeding issues. She has been deemed not a good candidate for Coumadin due to a number of issues with compliance by Dr. Rex Kras, and has been on aspirin and Plavix. Unfortunately, with regard to the Plavix she had complicated bleeding postoperatively from her gallbladder surgery and Plavix was discontinued due to this. It turns out that there is little benefit to it additional Plavix on top of aspirin for stroke reduction. As far as her stroke risk is concerned, it is actually fairly high considering she is in chronic atrial fibrillation and does have a history of hypertension and diabetes and is a female with a CHADS2 score of 3, a CHADS VASc score of 3-1/2 or 4.  Based on this, I recommended starting her on Eliquis.   Nicole [name] returns today for followup. Unfortunately she was just in the hospital with epistaxis. This is following a period of time where she developed small bowel obstruction and had a nasogastric tube in place. This could contribute to possible erosions. She was seen by an ear nose and throat specialist to placed a nasal balloon and ultimately packed her nose. This packing was in for a week and was removed and no further bleeding was noted. It was not mentioned what the cause of her bleeding was. She was on adequate. She still has a high CHADSVASC score. We had recommended discontinuing her eloquence due to bleeding. She was also in A. fib with RVR and  I recommended increasing her diltiazem to 240 mg daily but that was not performed. She's had no further epistaxis.  I saw Nicole [name] back today in the office. Overall she reports feeling very well. Unfortunate she's recently been having significant falls. She says she's been falling out of bed amongst other things and eventually developed significant bruising and has a large resolving hematoma on her right chin. Fortunately there was no intracranial bleeding. She is maintained on warfarin but is not had a check of her INR since March. Her last check in our office was in December. It's unclear why she felt that she did not need to come back for ongoing monthly INR checks. This of warfarin, but fortunately her INR today is 2.7. She remains in atrial fibrillation with a CHADSVASC score of 4. She also has a history of thoracic aortic aneurysm measuring up to 4.8 cm. This is been stable and recently when she had a neck CT, this is partially visualized and also measured 4.8 cm.   Nicole [name] returns today for hospital follow-up. Unfortunately in September she was hospitalized after acute confusion. Seen in the ER a CT scan indicated an acute intracerebral hemorrhage. This was significant with mass effect. Neurology and neurosurgery were consult it. She was on warfarin however INR was just above 3. This was urgently reversed. Blood pressure however was elevated at over 160/120. It was thought that this bleeding was due to hypertensive emergency. Subsequently she had a long hospitalization and rehabilitation. She was taken off of warfarin and recently  followed up with her neurologist , who suggested she could go on low-dose aspirin. She understands after and explanation by me today that the risk of ischemic stroke is still quite high.  Her CHADSVASC or is at least 6.  We did spend some time discussing an alternative which may be the watchman left atrial appendage occluder device.  12/09/2015  Nicole [name] was seen  back today in follow-up. Unfortunately she's had bleeding on anticoagulation and is at high risk for anticoagulation going forward. I had a discussion with her neurologist Dr. Leonie [name] regarding the possibility of the watchman and left atrial appendage occluder device. The concern is that she would have to be on 2 months of anticoagulation which he did not feel that she would tolerate. Therefore she is deemed not an anticoagulation candidate and if she is not a watchman candidate would remain on aspirin but at high risk for stroke. Heart failure seems to be well compensated. Blood pressure is at goal weight is stable.  07/27/2016  Nicole [name] was seen back today in follow-up. Overall she is doing fairly well. She's had close to 10 pound weight gain over the past several months however this may be a weight "rebound". She initially had lost some weight and feels like her appetite has improved somewhat and she seems to be eating better. She denies any worsening shortness of breath, orthopnea or lower extremity edema. Sure he is slightly elevated today. Memory seems to be somewhat worse according to her caregiver.  PMHx:   Past Medical History:  Diagnosis Date  . AAA (abdominal aortic aneurysm) ([place])   . Acute upper respiratory infections of unspecified site   . Anemia   . Anxiety   . Aortic aneurysm of unspecified site without mention of rupture   . Arthritis   . Atrial fibrillation ([place])   . Cervicalgia   . Cholelithiasis   . Chronic systolic heart failure ([place])   . CKD (chronic kidney disease)   . Colon cancer ([place]) 07/1997  . Depression   . Diabetes mellitus ([place])   . Dizziness and giddiness   . Electrolyte and fluid disorders not elsewhere classified   . GERD (gastroesophageal reflux disease)   . Gout, unspecified   . Hemorrhage of rectum and anus   . Hypercholesteremia   . Hypertension   . Hypoglycemia, unspecified   . Hypopotassemia   . Hypothyroidism   . Kidney stone    renal  calculi  . Malignant neoplasm of colon, unspecified site   . Other chronic allergic conjunctivitis   . Other specified cardiac dysrhythmias(427.89)   . Pain in joint, lower leg   . Perforation of bile duct   . Proteinuria   . Shortness of breath   . Small bowel obstruction due to adhesions 05/16/2012  . Stroke ([place])   . Swelling, mass, or lump in head and neck   . Thoracic aneurysm without mention of rupture    4.6 cm Asc Aortic aneurysm, CT 07/2015    Past Surgical History:  Procedure Laterality Date  . CARDIAC CATHETERIZATION  2008   moderate severe pulm HTN; with elevted pulm capillary wedge pressure   . CHOLECYSTECTOMY  05/14/2012   Procedure: LAPAROSCOPIC CHOLECYSTECTOMY WITH INTRAOPERATIVE CHOLANGIOGRAM;  Surgeon: Haywood Lasso, MD;  Location: [facility];  Service: General;  Laterality: N/A;  . COLON SURGERY     to remove colon ca  . ERCP  05/17/2012   Procedure: ENDOSCOPIC RETROGRADE CHOLANGIOPANCREATOGRAPHY (ERCP);  Surgeon: Missy Sabins, MD;  Location: [facility] OR;  Service: Gastroenterology;  Laterality: N/A;  . ERCP  08/12/2012   Procedure: ENDOSCOPIC RETROGRADE CHOLANGIOPANCREATOGRAPHY (ERCP);  Surgeon: Missy Sabins, MD;  Location[facility] ENDOSCOPY;  Service: Endoscopy;  Laterality: N/A;  . HEMICOLECTOMY  08/11/1997  . KIDNEY STONE SURGERY    . TRANSTHORACIC ECHOCARDIOGRAM  11/2009   EF 45-50%, mild-mod AV regurg; mild MR; mod TR; ascending aorta mildly dilated    FAMHx:  Family History  Problem Relation Age of Onset  . Heart disease Mother   . Stroke Father   . Hypertension Daughter   . Hypertension Son   . Diabetes Sister   . Hypertension Son     SOCHx:   reports that she has never smoked. She has never used smokeless tobacco. She reports that she does not drink alcohol or use drugs.  ALLERGIES:  Allergies  Allergen Reactions  . Iohexol Nausea And Vomiting  . Iodinated Diagnostic Agents Nausea And Vomiting  . Z-Pak [Azithromycin] Other (See Comments)    Unknown  allergic reaction    ROS: Pertinent items noted in HPI and remainder of comprehensive ROS otherwise negative.  HOME MEDS: Current Outpatient Prescriptions  Medication Sig Dispense Refill  . allopurinol (ZYLOPRIM) 100 MG tablet Take 100 mg by mouth daily.    Marland Kitchen aspirin EC 81 MG tablet Take 81 mg by mouth daily.    . cloNIDine (CATAPRES) 0.2 MG tablet take 1 tablet by mouth three times a day for hypertension 90 tablet 1  . donepezil (ARICEPT) 10 MG tablet Take 1 tablet (10 mg total) by mouth at bedtime. 90 tablet 3  . furosemide (LASIX) 20 MG tablet Take 20 mg by mouth See admin instructions. Take 1 tablet (20 mg) by mouth every other day, may also take one additional tablet daily for weight gain greater than 3 lbs.    . hydrALAZINE (APRESOLINE) 50 MG tablet Take 1 tablet (50 mg total) by mouth 3 (three) times daily. 120 tablet 3  . isosorbide mononitrate (IMDUR) 30 MG 24 hr tablet Take 30 mg by mouth at bedtime.    Marland Kitchen levothyroxine (SYNTHROID, LEVOTHROID) 50 MCG tablet Take 1 tablet (50 mcg total) by mouth daily before breakfast. 90 tablet 1  . metoprolol (LOPRESSOR) 100 MG tablet Take 1 tablet (100 mg total) by mouth 2 (two) times daily. 180 tablet 3  . polyethylene glycol (MIRALAX / GLYCOLAX) packet Take 17 g by mouth daily as needed for moderate constipation. (Patient taking differently: Take 17 g by mouth daily as needed for moderate constipation. Mix in 8 oz liquid and drink) 14 each 0  . potassium chloride (K-DUR) 10 MEQ tablet Take 1 tablet (10 mEq total) by mouth every other day. 45 tablet 1  . spironolactone (ALDACTONE) 25 MG tablet Take 1 tablet (25 mg total) by mouth daily. 90 tablet 3   No current facility-administered medications for this visit.     LABS/IMAGING: No results found for this or any previous visit (from the past 48 hour(s)). No results found.  VITALS: BP (!) 157/82   Pulse 71   Ht 5\' 8"  (1.727 m)   Wt 157 lb (71.2 kg)   BMI 23.87 kg/m   EXAM: General  appearance: alert and no distress Neck: no carotid bruit, no JVD and . Lungs: clear to auscultation bilaterally Heart: irregularly irregular rhythm Abdomen: soft, non-tender; bowel sounds normal; no masses,  no organomegaly Extremities: extremities normal, atraumatic, no cyanosis or edema Pulses: 2+ and symmetric Skin: Skin color, texture, turgor  normal. No rashes or lesions Neurologic: Mental status: Slow thought process  EKG: Deferred  ASSESSMENT: 1. Chronic atrial fibrillation - CHADSVASC score of 6 (not on anticoagulation now due to [place]) - not a watchman candidate, maintained on aspirin 2. Recent stroke with intracerebral hemorrhage 3. History of congestive heart failure, EF of 45% - up to 55-60% by echo (07/2015) 4. Hypertension 5. History of thoracic aneurysm - stable at 4.6-4.8 cm (07/2015) 6. Diabetes  7. Dyslipidemia 8. Chronic kidney disease  PLAN: 1.   Nicole [name] seems to be fairly stable at this point.  Although she's had some weight gain, there are no signs of worsening fluid overload. She is using Lasix as needed. Will need to continue to follow her thoracic aortic aneurysm which is been stable. EF has improved up to 55-60% as of January 2017. Follow-up in 6 months.  Pixie Casino, MD, [facility] Attending Cardiologist [place] 07/27/2016, 3:35 PM

## 2016-07-27 NOTE — Patient Instructions (Signed)
Continue same medications.   Your physician wants you to follow-up in: 6 months.  You will receive a reminder letter in the mail two months in advance. If you don't receive a letter, please call our office to schedule the follow-up appointment.  

## 2016-07-30 ENCOUNTER — Ambulatory Visit (INDEPENDENT_AMBULATORY_CARE_PROVIDER_SITE_OTHER): Payer: Medicare Other | Admitting: Internal Medicine

## 2016-07-30 ENCOUNTER — Encounter: Payer: Self-pay | Admitting: Internal Medicine

## 2016-07-30 VITALS — BP 158/80 | HR 82 | Temp 98.6°F | Wt 158.0 lb

## 2016-07-30 DIAGNOSIS — F0150 Vascular dementia without behavioral disturbance: Secondary | ICD-10-CM

## 2016-07-30 DIAGNOSIS — G8192 Hemiplegia, unspecified affecting left dominant side: Secondary | ICD-10-CM

## 2016-07-30 DIAGNOSIS — I482 Chronic atrial fibrillation, unspecified: Secondary | ICD-10-CM

## 2016-07-30 DIAGNOSIS — IMO0002 Reserved for concepts with insufficient information to code with codable children: Secondary | ICD-10-CM

## 2016-07-30 DIAGNOSIS — I10 Essential (primary) hypertension: Secondary | ICD-10-CM | POA: Diagnosis not present

## 2016-07-30 DIAGNOSIS — I872 Venous insufficiency (chronic) (peripheral): Secondary | ICD-10-CM

## 2016-07-30 DIAGNOSIS — E1121 Type 2 diabetes mellitus with diabetic nephropathy: Secondary | ICD-10-CM

## 2016-07-30 DIAGNOSIS — I639 Cerebral infarction, unspecified: Secondary | ICD-10-CM

## 2016-07-30 DIAGNOSIS — I5032 Chronic diastolic (congestive) heart failure: Secondary | ICD-10-CM | POA: Diagnosis not present

## 2016-07-30 NOTE — Progress Notes (Signed)
Location:  [facility] clinic Provider:  Essence Merle L. Mariea Clonts, D.O., C.M.D.  Code Status: DNR Goals of Care:  Advanced Directives 07/30/2016  Does Patient Have a Medical Advance Directive? Yes  Type of Advance Directive Living will;Healthcare Power of Attorney  Does patient want to make changes to medical advance directive? -  Copy of [place] in Chart? Yes  Would patient like information on creating a medical advance directive? -  Pre-existing out of facility DNR order (yellow form or pink MOST form) -   Chief Complaint  Patient presents with  . Medical Management of Chronic Issues    2MTH FOLLOW-UP    HPI: Patient is a 83 y.o. female seen today for medical management of chronic diseases.    No new concerns or problems.  Doing much better lately.  Had 12/29 visit to ED with episodic lightheadedness.  No longer so sleepy.  Is eating well.  Not going outside to walk due to cold temp.  She walks around and organizes and moves items.  Memory still not great, but otherwise overall well.  Dr. Debara Pickett felt her weight gain was more due to food than fluids.  Sometimes shortwinded, but not to where she has to actually lye down, just sit down to rest.    DMII:  Sugar average had improved.  Cont to monitor.  Due for urine microalbumin.    Past Medical History:  Diagnosis Date  . AAA (abdominal aortic aneurysm) ([place])   . Acute upper respiratory infections of unspecified site   . Anemia   . Anxiety   . Aortic aneurysm of unspecified site without mention of rupture   . Arthritis   . Atrial fibrillation ([place])   . Cervicalgia   . Cholelithiasis   . Chronic systolic heart failure ([place])   . CKD (chronic kidney disease)   . Colon cancer ([place]) 07/1997  . Depression   . Diabetes mellitus ([place])   . Dizziness and giddiness   . Electrolyte and fluid disorders not elsewhere classified   . GERD (gastroesophageal reflux disease)   . Gout, unspecified   . Hemorrhage of rectum and anus     . Hypercholesteremia   . Hypertension   . Hypoglycemia, unspecified   . Hypopotassemia   . Hypothyroidism   . Kidney stone    renal calculi  . Malignant neoplasm of colon, unspecified site   . Other chronic allergic conjunctivitis   . Other specified cardiac dysrhythmias(427.89)   . Pain in joint, lower leg   . Perforation of bile duct   . Proteinuria   . Shortness of breath   . Small bowel obstruction due to adhesions 05/16/2012  . Stroke ([place])   . Swelling, mass, or lump in head and neck   . Thoracic aneurysm without mention of rupture    4.6 cm Asc Aortic aneurysm, CT 07/2015    Past Surgical History:  Procedure Laterality Date  . CARDIAC CATHETERIZATION  2008   moderate severe pulm HTN; with elevted pulm capillary wedge pressure   . CHOLECYSTECTOMY  05/14/2012   Procedure: LAPAROSCOPIC CHOLECYSTECTOMY WITH INTRAOPERATIVE CHOLANGIOGRAM;  Surgeon: Haywood Lasso, MD;  Location: [facility];  Service: General;  Laterality: N/A;  . COLON SURGERY     to remove colon ca  . ERCP  05/17/2012   Procedure: ENDOSCOPIC RETROGRADE CHOLANGIOPANCREATOGRAPHY (ERCP);  Surgeon: Missy Sabins, MD;  Location: [facility];  Service: Gastroenterology;  Laterality: N/A;  . ERCP  08/12/2012   Procedure: ENDOSCOPIC RETROGRADE  CHOLANGIOPANCREATOGRAPHY (ERCP);  Surgeon: Missy Sabins, MD;  Location: [facility] ENDOSCOPY;  Service: Endoscopy;  Laterality: N/A;  . HEMICOLECTOMY  08/11/1997  . KIDNEY STONE SURGERY    . TRANSTHORACIC ECHOCARDIOGRAM  11/2009   EF 45-50%, mild-mod AV regurg; mild MR; mod TR; ascending aorta mildly dilated    Allergies  Allergen Reactions  . Iohexol Nausea And Vomiting  . Iodinated Diagnostic Agents Nausea And Vomiting  . Z-Pak [Azithromycin] Other (See Comments)    Unknown allergic reaction    Allergies as of 07/30/2016      Reactions   Iohexol Nausea And Vomiting   Iodinated Diagnostic Agents Nausea And Vomiting   Z-pak [azithromycin] Other (See Comments)   Unknown allergic  reaction      Medication List       Accurate as of 07/30/16 10:55 AM. Always use your most recent med list.          allopurinol 100 MG tablet Commonly known as:  ZYLOPRIM Take 100 mg by mouth daily.   aspirin EC 81 MG tablet Take 81 mg by mouth daily.   cloNIDine 0.2 MG tablet Commonly known as:  CATAPRES take 1 tablet by mouth three times a day for hypertension   donepezil 10 MG tablet Commonly known as:  ARICEPT Take 1 tablet (10 mg total) by mouth at bedtime.   furosemide 20 MG tablet Commonly known as:  LASIX Take 20 mg by mouth See admin instructions. Take 1 tablet (20 mg) by mouth every other day, may also take one additional tablet daily for weight gain greater than 3 lbs.   hydrALAZINE 50 MG tablet Commonly known as:  APRESOLINE Take 1 tablet (50 mg total) by mouth 3 (three) times daily.   isosorbide mononitrate 30 MG 24 hr tablet Commonly known as:  IMDUR Take 30 mg by mouth at bedtime.   levothyroxine 50 MCG tablet Commonly known as:  SYNTHROID, LEVOTHROID Take 1 tablet (50 mcg total) by mouth daily before breakfast.   metoprolol 100 MG tablet Commonly known as:  LOPRESSOR Take 1 tablet (100 mg total) by mouth 2 (two) times daily.   polyethylene glycol packet Commonly known as:  MIRALAX / GLYCOLAX Take 17 g by mouth daily as needed for moderate constipation.   potassium chloride 10 MEQ tablet Commonly known as:  K-DUR Take 1 tablet (10 mEq total) by mouth every other day.   spironolactone 25 MG tablet Commonly known as:  ALDACTONE Take 1 tablet (25 mg total) by mouth daily.       Review of Systems:  Review of Systems  Constitutional: Negative for chills, fever and malaise/fatigue.  HENT: Negative for congestion and hearing loss.   Eyes: Negative for blurred vision.  Respiratory: Negative for cough and shortness of breath.   Cardiovascular: Negative for chest pain, palpitations and leg swelling.  Gastrointestinal: Negative for abdominal  pain.  Genitourinary: Negative for dysuria.  Musculoskeletal: Negative for falls.  Skin: Negative for itching and rash.  Neurological: Positive for dizziness. Negative for loss of consciousness and weakness.  Endo/Heme/Allergies: Does not bruise/bleed easily.  Psychiatric/Behavioral: Positive for memory loss. Negative for depression. The patient is not nervous/anxious and does not have insomnia.     Health Maintenance  Topic Date Due  . OPHTHALMOLOGY EXAM  03/22/1941  . ZOSTAVAX  03/21/1993  . URINE MICROALBUMIN  06/30/2016  . MAMMOGRAM  09/08/2016  . HEMOGLOBIN A1C  11/21/2016  . FOOT EXAM  05/24/2017  . TETANUS/TDAP  09/16/2021  . INFLUENZA VACCINE  Completed  . DEXA SCAN  Completed  . PNA vac Low Risk Adult  Completed    Physical Exam: Vitals:   07/30/16 1036  BP: (!) 158/80  Pulse: 82  Temp: 98.6 F (37 C)  TempSrc: Oral  SpO2: 96%  Weight: 158 lb (71.7 kg)   Body mass index is 24.02 kg/m. Physical Exam  Constitutional: She appears well-developed and well-nourished. No distress.  Cardiovascular:  Murmur heard. irreg irreg  Pulmonary/Chest: Effort normal and breath sounds normal. She has no rales.  Abdominal: Soft. Bowel sounds are normal. She exhibits no distension. There is no tenderness.  Musculoskeletal: Normal range of motion.  Neurological: She is alert.  Oriented to person, place; right sided weakness but much improved from initial post stroke period, ambulates without assistive device  Skin: Skin is warm and dry.  Psychiatric: She has a normal mood and affect.    Labs reviewed: Basic Metabolic Panel:  Recent Labs  10/12/15 0928  01/08/16 0508  02/17/16 1259 03/14/16 1608  04/11/16 1002  05/01/16 1332 05/24/16 1106 07/20/16 1903  NA 139  < > 138  < > 138 139  < > 139  < > 138 141 140  K 4.0  < > 3.3*  < > 4.1 4.5  < > 3.8  < > 4.3 4.3 4.2  CL 100  < > 99*  < > 105 106  < > 104  < > 104 105 109  CO2 23  < > 29  < > 26 23  < > 29  < > 25 28 24    GLUCOSE 120*  < > 144*  < > 147* 100*  < > 128*  < > 107* 205* 113*  BUN 12  < > 18  < > 21* 16  < > 15  < > 24* 23 20  CREATININE 1.13*  < > 1.23*  < > 1.32* 1.11*  < > 1.28*  < > 1.46* 1.69* 1.45*  CALCIUM 9.1  < > 9.1  < > 8.8* 9.4  < > 8.3*  < > 9.2 9.3 9.0  MG  --   < > 1.5*  --  1.7  --   --  1.4*  --   --   --   --   TSH 3.760  --   --   --   --  2.76  --   --   --   --   --   --   < > = values in this interval not displayed. Liver Function Tests:  Recent Labs  03/14/16 1608 05/24/16 1106 07/20/16 1903  AST 42* 17 17  ALT 46* 12 13*  ALKPHOS 93 82 79  BILITOT 0.9 0.5 0.4  PROT 6.3 7.1 7.2  ALBUMIN 3.3* 3.6 3.3*    Recent Labs  01/07/16 0921 07/20/16 1903  LIPASE 51 54*   No results for input(s): AMMONIA in the last 8760 hours. CBC:  Recent Labs  02/17/16 1259 03/14/16 1608  04/10/16 0447 05/01/16 1332 07/20/16 2028  WBC 6.3 5.2  < > 6.0 5.0 6.2  NEUTROABS 4.3 2,912  --   --   --  4.2  HGB 10.2* 10.1*  < > 10.1* 10.8* 11.6*  HCT 30.6* 32.7*  < > 30.5* 32.7* 33.4*  MCV 63.6* 69.4*  < > 65.6* 65.1* 68.7*  PLT 315 354  < > 286 290 285  < > = values in this interval not displayed. Lipid Panel:  Recent Labs  10/12/15 0928  CHOL 142  HDL 47  LDLCALC 79  TRIG 82  CHOLHDL 3.0   Lab Results  Component Value Date   HGBA1C 7.0 (H) 05/24/2016    Procedures since last visit: Ct Head Wo Contrast  Result Date: 07/20/2016 CLINICAL DATA:  Altered mental status. EXAM: CT HEAD WITHOUT CONTRAST TECHNIQUE: Contiguous axial images were obtained from the base of the skull through the vertex without intravenous contrast. COMPARISON:  CT scan of March 14, 2016. FINDINGS: Brain: No evidence of acute infarction, hemorrhage, hydrocephalus, extra-axial collection or mass lesion/mass effect. Mild chronic ischemic white matter disease is noted. Minimal diffuse cortical atrophy is noted. Vascular: Atherosclerosis of carotid siphons is noted. Skull: Normal. Negative for  fracture or focal lesion. Sinuses/Orbits: No acute finding. Other: None. IMPRESSION: Minimal diffuse cortical atrophy. Mild chronic ischemic white matter disease. No acute intracranial abnormality seen. Electronically Signed   By: Marijo Conception, M.D.   On: 07/20/2016 19:52    Assessment/Plan 1. Type 2 diabetes mellitus with diabetic nephropathy, without long-term current use of insulin (HCC) - has been well controlled, check urine micro and recheck hba1c before next visit (just done in Nov) - DNR (Do Not Resuscitate) - Microalbumin/Creatinine Ratio, Urine - CBC with Differential/Platelet; Future - COMPLETE METABOLIC PANEL WITH GFR; Future - Hemoglobin A1c; Future - Lipid panel; Future  2. Chronic diastolic congestive heart failure (HCC) - stable, just saw Dr. Debara Pickett and had a good report there also - DNR (Do Not Resuscitate) - COMPLETE METABOLIC PANEL WITH GFR; Future  3. Chronic atrial fibrillation (HCC) - ongoing, rate controlled, on baby asa only due to cerebral hemorrhage - DNR (Do Not Resuscitate) - CBC with Differential/Platelet; Future - COMPLETE METABOLIC PANEL WITH GFR; Future  4. Essential hypertension - bp satisfactory, when lowered further, she develops dizziness and falls - DNR (Do Not Resuscitate) - COMPLETE METABOLIC PANEL WITH GFR; Future  5. Subcortical vascular dementia, without behavioral disturbance - ongoing MMSE - Mini Mental State Exam 05/24/2016 06/09/2015 04/23/2013  Not completed: - (No Data) -  Orientation to time 0 0 5  Orientation to Place 4 0 5  Registration 3 3 3   Attention/ Calculation 4 5 5   Recall 3 0 2  Language- name 2 objects 2 2 2   Language- repeat 1 1 1   Language- follow 3 step command 2 3 3   Language- read & follow direction 0 1 1  Write a sentence 1 1 1   Copy design 0 0 1  Total score 20 16 29   - had some improvement from post stroke (4 pts better) at last check and is much more alert and conversive - DNR (Do Not Resuscitate)  6.  Chronic venous insufficiency -ongoing, not wearing her compression hose today, but also w/o significant edema - DNR (Do Not Resuscitate)  7. Hemiparesis of left dominant side due to cerebral infarction (HCC) -mild weakness still present on left since stroke  Labs/tests ordered:   Orders Placed This Encounter  Procedures  . Microalbumin/Creatinine Ratio, Urine  . CBC with Differential/Platelet    Standing Status:   Future    Standing Expiration Date:   01/27/2017  . COMPLETE METABOLIC PANEL WITH GFR    [facility]    Standing Status:   Future    Standing Expiration Date:   01/27/2017  . Hemoglobin A1c    Standing Status:   Future    Standing Expiration Date:   01/27/2017  . Lipid panel    Standing Status:  Future    Standing Expiration Date:   01/27/2017    Order Specific Question:   Has the patient fasted?    Answer:   Yes  . DNR (Do Not Resuscitate)    Order Specific Question:   In the event of cardiac or respiratory ARREST    Answer:   Do not call a "code blue"    Order Specific Question:   In the event of cardiac or respiratory ARREST    Answer:   Do not perform Intubation, CPR, defibrillation or ACLS    Order Specific Question:   In the event of cardiac or respiratory ARREST    Answer:   Use medication by any route, position, wound care, and other measures to relive pain and suffering. May use oxygen, suction and manual treatment of airway obstruction as needed for comfort.   Next appt:  .3 mos med mgt, labs before   Shoua Ulloa L. Gabrille Kilbride, D.O. [facility] [address],  [address] Cell Phone (Mon-Fri 8am-5pm):  [phone] On Call:  [phone] & follow prompts after 5pm & weekends Office Phone:  [phone] Office Fax:  [phone]

## 2016-07-31 ENCOUNTER — Encounter: Payer: Self-pay | Admitting: *Deleted

## 2016-07-31 LAB — MICROALBUMIN / CREATININE URINE RATIO
Creatinine, Urine: 52 mg/dL (ref 20–320)
Microalb Creat Ratio: 335 mcg/mg creat — ABNORMAL HIGH (ref <30)
Microalb, Ur: 17.4 mg/dL

## 2016-08-04 ENCOUNTER — Other Ambulatory Visit: Payer: Self-pay | Admitting: Internal Medicine

## 2016-08-22 ENCOUNTER — Encounter (HOSPITAL_COMMUNITY): Payer: Self-pay

## 2016-08-22 ENCOUNTER — Emergency Department (HOSPITAL_COMMUNITY): Payer: Medicare Other

## 2016-08-22 ENCOUNTER — Inpatient Hospital Stay (HOSPITAL_COMMUNITY)
Admission: EM | Admit: 2016-08-22 | Discharge: 2016-08-26 | DRG: 176 | Disposition: A | Discharge status: Home / Self Care | Payer: Medicare Other | Attending: Internal Medicine | Admitting: Internal Medicine

## 2016-08-22 DIAGNOSIS — Z888 Allergy status to other drugs, medicaments and biological substances status: Secondary | ICD-10-CM

## 2016-08-22 DIAGNOSIS — I5032 Chronic diastolic (congestive) heart failure: Secondary | ICD-10-CM | POA: Diagnosis present

## 2016-08-22 DIAGNOSIS — Z7982 Long term (current) use of aspirin: Secondary | ICD-10-CM

## 2016-08-22 DIAGNOSIS — I482 Chronic atrial fibrillation, unspecified: Secondary | ICD-10-CM | POA: Diagnosis present

## 2016-08-22 DIAGNOSIS — Z8673 Personal history of transient ischemic attack (TIA), and cerebral infarction without residual deficits: Secondary | ICD-10-CM | POA: Diagnosis not present

## 2016-08-22 DIAGNOSIS — Z833 Family history of diabetes mellitus: Secondary | ICD-10-CM

## 2016-08-22 DIAGNOSIS — E1151 Type 2 diabetes mellitus with diabetic peripheral angiopathy without gangrene: Secondary | ICD-10-CM | POA: Diagnosis not present

## 2016-08-22 DIAGNOSIS — R791 Abnormal coagulation profile: Secondary | ICD-10-CM | POA: Diagnosis not present

## 2016-08-22 DIAGNOSIS — Z91041 Radiographic dye allergy status: Secondary | ICD-10-CM

## 2016-08-22 DIAGNOSIS — E7800 Pure hypercholesterolemia, unspecified: Secondary | ICD-10-CM | POA: Diagnosis not present

## 2016-08-22 DIAGNOSIS — I2699 Other pulmonary embolism without acute cor pulmonale: Principal | ICD-10-CM | POA: Diagnosis present

## 2016-08-22 DIAGNOSIS — I10 Essential (primary) hypertension: Secondary | ICD-10-CM | POA: Diagnosis present

## 2016-08-22 DIAGNOSIS — R0781 Pleurodynia: Secondary | ICD-10-CM

## 2016-08-22 DIAGNOSIS — E1122 Type 2 diabetes mellitus with diabetic chronic kidney disease: Secondary | ICD-10-CM | POA: Diagnosis not present

## 2016-08-22 DIAGNOSIS — Z8249 Family history of ischemic heart disease and other diseases of the circulatory system: Secondary | ICD-10-CM | POA: Diagnosis not present

## 2016-08-22 DIAGNOSIS — M109 Gout, unspecified: Secondary | ICD-10-CM | POA: Diagnosis present

## 2016-08-22 DIAGNOSIS — I619 Nontraumatic intracerebral hemorrhage, unspecified: Secondary | ICD-10-CM | POA: Diagnosis present

## 2016-08-22 DIAGNOSIS — R079 Chest pain, unspecified: Secondary | ICD-10-CM

## 2016-08-22 DIAGNOSIS — Z881 Allergy status to other antibiotic agents status: Secondary | ICD-10-CM

## 2016-08-22 DIAGNOSIS — E1142 Type 2 diabetes mellitus with diabetic polyneuropathy: Secondary | ICD-10-CM | POA: Diagnosis present

## 2016-08-22 DIAGNOSIS — E039 Hypothyroidism, unspecified: Secondary | ICD-10-CM | POA: Diagnosis present

## 2016-08-22 DIAGNOSIS — N183 Chronic kidney disease, stage 3 (moderate): Secondary | ICD-10-CM | POA: Diagnosis present

## 2016-08-22 DIAGNOSIS — R071 Chest pain on breathing: Secondary | ICD-10-CM | POA: Diagnosis not present

## 2016-08-22 DIAGNOSIS — I130 Hypertensive heart and chronic kidney disease with heart failure and stage 1 through stage 4 chronic kidney disease, or unspecified chronic kidney disease: Secondary | ICD-10-CM | POA: Diagnosis not present

## 2016-08-22 DIAGNOSIS — K219 Gastro-esophageal reflux disease without esophagitis: Secondary | ICD-10-CM | POA: Diagnosis not present

## 2016-08-22 DIAGNOSIS — F0390 Unspecified dementia without behavioral disturbance: Secondary | ICD-10-CM | POA: Diagnosis present

## 2016-08-22 DIAGNOSIS — I613 Nontraumatic intracerebral hemorrhage in brain stem: Secondary | ICD-10-CM | POA: Diagnosis not present

## 2016-08-22 DIAGNOSIS — Z823 Family history of stroke: Secondary | ICD-10-CM

## 2016-08-22 DIAGNOSIS — D509 Iron deficiency anemia, unspecified: Secondary | ICD-10-CM | POA: Diagnosis present

## 2016-08-22 DIAGNOSIS — Z9049 Acquired absence of other specified parts of digestive tract: Secondary | ICD-10-CM

## 2016-08-22 DIAGNOSIS — R0602 Shortness of breath: Secondary | ICD-10-CM | POA: Diagnosis not present

## 2016-08-22 DIAGNOSIS — I714 Abdominal aortic aneurysm, without rupture: Secondary | ICD-10-CM | POA: Diagnosis present

## 2016-08-22 DIAGNOSIS — Z85038 Personal history of other malignant neoplasm of large intestine: Secondary | ICD-10-CM

## 2016-08-22 DIAGNOSIS — R7989 Other specified abnormal findings of blood chemistry: Secondary | ICD-10-CM

## 2016-08-22 DIAGNOSIS — Z66 Do not resuscitate: Secondary | ICD-10-CM | POA: Diagnosis present

## 2016-08-22 DIAGNOSIS — R0789 Other chest pain: Secondary | ICD-10-CM | POA: Diagnosis not present

## 2016-08-22 LAB — BASIC METABOLIC PANEL
ANION GAP: 7 (ref 5–15)
BUN: 19 mg/dL (ref 6–20)
CALCIUM: 9.1 mg/dL (ref 8.9–10.3)
CHLORIDE: 108 mmol/L (ref 101–111)
CO2: 24 mmol/L (ref 22–32)
Creatinine, Ser: 1.38 mg/dL — ABNORMAL HIGH (ref 0.44–1.00)
GFR calc non Af Amer: 34 mL/min — ABNORMAL LOW (ref >60)
GFR, EST AFRICAN AMERICAN: 40 mL/min — AB (ref >60)
Glucose, Bld: 200 mg/dL — ABNORMAL HIGH (ref 65–99)
Potassium: 4.2 mmol/L (ref 3.5–5.1)
SODIUM: 139 mmol/L (ref 135–145)

## 2016-08-22 LAB — I-STAT TROPONIN, ED
TROPONIN I, POC: 0 ng/mL (ref 0.00–0.08)
TROPONIN I, POC: 0 ng/mL (ref 0.00–0.08)

## 2016-08-22 LAB — D-DIMER, QUANTITATIVE: D-Dimer, Quant: 2.72 ug/mL-FEU — ABNORMAL HIGH (ref 0.00–0.50)

## 2016-08-22 LAB — CBC
HCT: 34.3 % — ABNORMAL LOW (ref 36.0–46.0)
HEMOGLOBIN: 11.6 g/dL — AB (ref 12.0–15.0)
MCH: 23.9 pg — AB (ref 26.0–34.0)
MCHC: 33.8 g/dL (ref 30.0–36.0)
MCV: 70.6 fL — AB (ref 78.0–100.0)
PLATELETS: 287 10*3/uL (ref 150–400)
RBC: 4.86 MIL/uL (ref 3.87–5.11)
RDW: 16.9 % — ABNORMAL HIGH (ref 11.5–15.5)
WBC: 6.6 10*3/uL (ref 4.0–10.5)

## 2016-08-22 LAB — BRAIN NATRIURETIC PEPTIDE: B NATRIURETIC PEPTIDE 5: 490.8 pg/mL — AB (ref 0.0–100.0)

## 2016-08-22 LAB — I-STAT CG4 LACTIC ACID, ED: LACTIC ACID, VENOUS: 1.67 mmol/L (ref 0.5–1.9)

## 2016-08-22 LAB — PROTIME-INR
INR: 1.15
Prothrombin Time: 14.8 seconds (ref 11.4–15.2)

## 2016-08-22 LAB — INFLUENZA PANEL BY PCR (TYPE A & B)
Influenza A By PCR: NEGATIVE
Influenza B By PCR: NEGATIVE

## 2016-08-22 MED ORDER — METOPROLOL TARTRATE 100 MG PO TABS
100.0000 mg | ORAL_TABLET | Freq: Two times a day (BID) | ORAL | Status: DC
  Administered 2016-08-22 – 2016-08-26 (×8): 100 mg via ORAL
  Filled 2016-08-22 (×8): qty 1

## 2016-08-22 MED ORDER — IPRATROPIUM-ALBUTEROL 0.5-2.5 (3) MG/3ML IN SOLN: 3.0000 mL | RESPIRATORY_TRACT | Status: DC | PRN

## 2016-08-22 MED ORDER — HYDROCODONE-ACETAMINOPHEN 5-325 MG PO TABS: 1.0000 | ORAL_TABLET | Freq: Four times a day (QID) | ORAL | Status: DC | PRN

## 2016-08-22 MED ORDER — ONDANSETRON HCL 4 MG/2ML IJ SOLN: 4.0000 mg | Freq: Four times a day (QID) | INTRAMUSCULAR | Status: DC | PRN

## 2016-08-22 MED ORDER — HEPARIN (PORCINE) IN NACL 100-0.45 UNIT/ML-% IJ SOLN
1450.0000 [IU]/h | INTRAMUSCULAR | Status: DC
  Administered 2016-08-23: 1150 [IU]/h via INTRAVENOUS
  Administered 2016-08-23: 1000 [IU]/h via INTRAVENOUS
  Administered 2016-08-24: 1300 [IU]/h via INTRAVENOUS
  Filled 2016-08-22 (×3): qty 250

## 2016-08-22 MED ORDER — ASPIRIN 81 MG PO CHEW
162.0000 mg | CHEWABLE_TABLET | Freq: Once | ORAL | Status: AC
  Administered 2016-08-22: 162 mg via ORAL
  Filled 2016-08-22: qty 2

## 2016-08-22 MED ORDER — ASPIRIN EC 81 MG PO TBEC
81.0000 mg | DELAYED_RELEASE_TABLET | Freq: Every day | ORAL | Status: DC
  Administered 2016-08-23 – 2016-08-25 (×3): 81 mg via ORAL
  Filled 2016-08-22 (×3): qty 1

## 2016-08-22 MED ORDER — HYDROCODONE-ACETAMINOPHEN 5-325 MG PO TABS
1.0000 | ORAL_TABLET | Freq: Four times a day (QID) | ORAL | Status: DC | PRN
  Administered 2016-08-22: 1 via ORAL
  Filled 2016-08-22: qty 1

## 2016-08-22 MED ORDER — FENTANYL CITRATE (PF) 100 MCG/2ML IJ SOLN
25.0000 ug | Freq: Once | INTRAMUSCULAR | Status: AC
  Administered 2016-08-22: 25 ug via INTRAVENOUS
  Filled 2016-08-22: qty 2

## 2016-08-22 MED ORDER — ENOXAPARIN SODIUM 40 MG/0.4ML ~~LOC~~ SOLN
40.0000 mg | SUBCUTANEOUS | Status: DC
  Filled 2016-08-22: qty 0.4

## 2016-08-22 MED ORDER — ALLOPURINOL 100 MG PO TABS
100.0000 mg | ORAL_TABLET | Freq: Every day | ORAL | Status: DC
  Administered 2016-08-23 – 2016-08-26 (×4): 100 mg via ORAL
  Filled 2016-08-22 (×4): qty 1

## 2016-08-22 MED ORDER — FUROSEMIDE 20 MG PO TABS: 20.0000 mg | ORAL_TABLET | ORAL | Status: DC

## 2016-08-22 MED ORDER — LEVOTHYROXINE SODIUM 50 MCG PO TABS
50.0000 ug | ORAL_TABLET | Freq: Every day | ORAL | Status: DC
  Administered 2016-08-23 – 2016-08-26 (×4): 50 ug via ORAL
  Filled 2016-08-22 (×5): qty 1

## 2016-08-22 MED ORDER — MORPHINE SULFATE (PF) 4 MG/ML IV SOLN
2.0000 mg | INTRAVENOUS | Status: DC | PRN
  Administered 2016-08-22 – 2016-08-23 (×3): 2 mg via INTRAVENOUS
  Filled 2016-08-22 (×2): qty 1

## 2016-08-22 MED ORDER — GI COCKTAIL ~~LOC~~: 30.0000 mL | Freq: Four times a day (QID) | ORAL | Status: DC | PRN

## 2016-08-22 MED ORDER — SPIRONOLACTONE 25 MG PO TABS
25.0000 mg | ORAL_TABLET | Freq: Every day | ORAL | Status: DC
  Administered 2016-08-23: 25 mg via ORAL
  Filled 2016-08-22: qty 1

## 2016-08-22 MED ORDER — HYDRALAZINE HCL 50 MG PO TABS
50.0000 mg | ORAL_TABLET | Freq: Three times a day (TID) | ORAL | Status: DC
  Administered 2016-08-22 – 2016-08-26 (×11): 50 mg via ORAL
  Filled 2016-08-22 (×11): qty 1

## 2016-08-22 MED ORDER — MORPHINE SULFATE (PF) 4 MG/ML IV SOLN
INTRAVENOUS | Status: AC
  Filled 2016-08-22: qty 1

## 2016-08-22 MED ORDER — POLYETHYLENE GLYCOL 3350 17 G PO PACK: 17.0000 g | PACK | Freq: Every day | ORAL | Status: DC | PRN

## 2016-08-22 MED ORDER — DONEPEZIL HCL 10 MG PO TABS
10.0000 mg | ORAL_TABLET | Freq: Every day | ORAL | Status: DC
  Administered 2016-08-22 – 2016-08-25 (×4): 10 mg via ORAL
  Filled 2016-08-22 (×5): qty 1

## 2016-08-22 MED ORDER — ACETAMINOPHEN 325 MG PO TABS: 650.0000 mg | ORAL_TABLET | ORAL | Status: DC | PRN

## 2016-08-22 MED ORDER — POTASSIUM CHLORIDE CRYS ER 10 MEQ PO TBCR
10.0000 meq | EXTENDED_RELEASE_TABLET | ORAL | Status: DC
  Filled 2016-08-22: qty 1

## 2016-08-22 MED ORDER — ISOSORBIDE MONONITRATE ER 30 MG PO TB24
30.0000 mg | ORAL_TABLET | Freq: Every day | ORAL | Status: DC
  Administered 2016-08-22 – 2016-08-25 (×4): 30 mg via ORAL
  Filled 2016-08-22 (×4): qty 1

## 2016-08-22 MED ORDER — TECHNETIUM TC 99M DIETHYLENETRIAME-PENTAACETIC ACID: 31.0000 | Freq: Once | INTRAVENOUS | Status: DC | PRN

## 2016-08-22 MED ORDER — CLONIDINE HCL 0.2 MG PO TABS
0.2000 mg | ORAL_TABLET | Freq: Three times a day (TID) | ORAL | Status: DC
  Administered 2016-08-22 – 2016-08-26 (×11): 0.2 mg via ORAL
  Filled 2016-08-22 (×11): qty 1

## 2016-08-22 MED ORDER — TECHNETIUM TO 99M ALBUMIN AGGREGATED
4.0000 | Freq: Once | INTRAVENOUS | Status: AC | PRN
  Administered 2016-08-22: 4 via INTRAVENOUS

## 2016-08-22 NOTE — ED Notes (Signed)
Patient transported to Ultrasound VQ scan.

## 2016-08-22 NOTE — ED Notes (Signed)
Report attempted, RN to call back. 

## 2016-08-22 NOTE — Progress Notes (Signed)
Text paged [facility] Plan with [facility] pertaining to patients VQ scan has resulted.

## 2016-08-22 NOTE — ED Notes (Signed)
Pt remains sob with O2.

## 2016-08-22 NOTE — ED Notes (Addendum)
Pt ambulated to restroom from room located right beside of room. This tech walked pt to restroom, while ambulating back pt stated her pain was 8/10 and she was SOB. Pt back in bed and on monitor, pt states her chest is hurting and she felt tired, that walk took a lot out of her. RN was notified.

## 2016-08-22 NOTE — ED Provider Notes (Signed)
[facility] DEPT Provider Note   CSN: [id number] Arrival date & time: 08/22/16  1453     History   Chief Complaint Chief Complaint  Patient presents with  . Chest Pain    HPI Nicole [name] is a 83 y.o. female.  HPI   83 year old female with history of hypertension, diastolic CHF, CKD, atrial fibrillation (not on anticoagulation) , hypothyroidism, dementia, colon cancer, AAA presenting with Central chest pain. Patient reports she woke up this morning with chest pain and shortness of breath. Chest pain is located in substernal area. Does not radiate. She is here with shortness of breath. No fevers. Patient does feel more fatigued than usual.  Patient denies any recent medication changes.   Denies any recent immobilization. Denies any recent surgeries.     Past Medical History:  Diagnosis Date  . AAA (abdominal aortic aneurysm) ([place])   . Acute upper respiratory infections of unspecified site   . Anemia   . Anxiety   . Aortic aneurysm of unspecified site without mention of rupture   . Arthritis   . Atrial fibrillation ([place])   . Cervicalgia   . Cholelithiasis   . Chronic systolic heart failure ([place])   . CKD (chronic kidney disease)   . Colon cancer ([place]) 07/1997  . Depression   . Diabetes mellitus ([place])   . Dizziness and giddiness   . Electrolyte and fluid disorders not elsewhere classified   . GERD (gastroesophageal reflux disease)   . Gout, unspecified   . Hemorrhage of rectum and anus   . Hypercholesteremia   . Hypertension   . Hypoglycemia, unspecified   . Hypopotassemia   . Hypothyroidism   . Kidney stone    renal calculi  . Malignant neoplasm of colon, unspecified site   . Other chronic allergic conjunctivitis   . Other specified cardiac dysrhythmias(427.89)   . Pain in joint, lower leg   . Perforation of bile duct   . Proteinuria   . Shortness of breath   . Small bowel obstruction due to adhesions 05/16/2012  . Stroke ([place])   . Swelling, mass,  or lump in head and neck   . Thoracic aneurysm without mention of rupture    4.6 cm Asc Aortic aneurysm, CT 07/2015    Patient Active Problem List   Diagnosis Date Noted  . Abdominal pain, epigastric 01/07/2016  . Accelerated hypertension with diastolic congestive heart failure, NYHA class 3 ([place]) 01/05/2016  . Atrial fibrillation ([place]) 01/05/2016  . Rib pain on right side 10/14/2015  . Hypothyroidism due to acquired atrophy of thyroid 10/14/2015  . Type 2 diabetes mellitus with diabetic nephropathy, without long-term current use of insulin ([place]) 10/14/2015  . Essential hypertension 10/14/2015  . Acute on chronic systolic heart failure ([place]) 08/02/2015  . Acute on chronic diastolic heart failure ([place]) 08/02/2015  . Bradycardia 07/27/2015  . Chronic anemia 07/27/2015  . Chest pain   . Vascular dementia 06/13/2015  . Memory loss 06/13/2015  . Left hemiparesis ([place])   . Chronic diastolic CHF (congestive heart failure), NYHA class 2 ([place])   . Persistent atrial fibrillation ([place])   . Type 2 diabetes mellitus with peripheral neuropathy (H[facility])   . ICH (intracerebral hemorrhage) ([place]) 04/04/2015  . Chronic atrial fibrillation ([place])   . Hypertensive urgency   . SOB (shortness of breath) 04/03/2014  . Epistaxis 02/15/2014  . SBO (small bowel obstruction) 01/23/2014  . Diabetic nephropathy ([place]) 09/07/2013  . Chronic constipation 03/25/2013  . Carpal tunnel syndrome 02/25/2013  .  Hypothyroidism   . Gout flare 06/01/2012  . Leakage of common bile duct s/p ERCP / stent 05/30/2012  . Ileus post op. still NPO 05/22/2012  . Bile leak, postoperative 05/17/2012  . Hypokalemia 05/17/2012  . Atrial fibrillation with RVR ([place]) 05/16/2012  . Postoperative ileus ([place]) 05/16/2012  . Small bowel obstruction due to adhesions 05/16/2012  . Acute on chronic renal insufficiency 05/15/2012  . Normal coronary arteries, 2008 05/15/2012  . Type II or unspecified type diabetes mellitus with neurological  manifestations, not stated as uncontrolled(250.60) 05/15/2012  . HTN (hypertension) 05/15/2012  . Respiratory distress post op secondary to rapid AF 05/15/2012  . ARF (acute renal failure) ([place]) 05/15/2012  . Abdominal pain, acute, right upper quadrant 05/12/2012  . Nausea and vomiting 05/12/2012  . Dehydration 05/12/2012  . Cholecystitis chronic, acute, S/P lap cholecystectomy 05/14/12 05/12/2012  . Thoracic aortic aneurysm, 4.8cm 2011   . CHF, past NICM with an EF of 30%, most recent echo 2011 showed EF to be45- 50%   . Anemia 08/09/2011  . History of colon cancer, stage III 08/09/2011    Past Surgical History:  Procedure Laterality Date  . CARDIAC CATHETERIZATION  2008   moderate severe pulm HTN; with elevted pulm capillary wedge pressure   . CHOLECYSTECTOMY  05/14/2012   Procedure: LAPAROSCOPIC CHOLECYSTECTOMY WITH INTRAOPERATIVE CHOLANGIOGRAM;  Surgeon: Haywood Lasso, MD;  Location: [facility];  Service: General;  Laterality: N/A;  . COLON SURGERY     to remove colon ca  . ERCP  05/17/2012   Procedure: ENDOSCOPIC RETROGRADE CHOLANGIOPANCREATOGRAPHY (ERCP);  Surgeon: Missy Sabins, MD;  Location: [facility];  Service: Gastroenterology;  Laterality: N/A;  . ERCP  08/12/2012   Procedure: ENDOSCOPIC RETROGRADE CHOLANGIOPANCREATOGRAPHY (ERCP);  Surgeon: Missy Sabins, MD;  Location[facility] END[facility]COPY;  Service: Endoscopy;  Laterality: N/A;  . HEMICOLECTOMY  08/11/1997  . KIDNEY STONE SURGERY    . TRANSTHORACIC ECHOCARDIOGRAM  11/2009   EF 45-50%, mild-mod AV regurg; mild MR; mod TR; ascending aorta mildly dilated    OB History    No data available       Home Medications    Prior to Admission medications   Medication Sig Start Date End Date Taking? Authorizing Provider  allopurinol (ZYLOPRIM) 100 MG tablet Take 100 mg by mouth daily.   Yes Historical Provider, MD  aspirin EC 81 MG tablet Take 81 mg by mouth daily.   Yes Historical Provider, MD  cloNIDine (CATAPRES) 0.2 MG tablet take 1  tablet by mouth three times a day for hypertension 08/06/16  Yes [name] [name], DO  donepezil (ARICEPT) 10 MG tablet Take 1 tablet (10 mg total) by mouth at bedtime. 01/09/16  Yes [name] [name], DO  furosemide (LASIX) 20 MG tablet Take 20 mg by mouth See admin instructions. Take 1 tablet (20 mg) by mouth every other day, may also take one additional tablet daily for weight gain greater than 3 lbs.   Yes Historical Provider, MD  hydrALAZINE (APRESOLINE) 50 MG tablet Take 1 tablet (50 mg total) by mouth 3 (three) times daily. 04/12/16  Yes Costin Karlyne Greenspan, MD  isosorbide mononitrate (IMDUR) 30 MG 24 hr tablet Take 30 mg by mouth at bedtime.   Yes Historical Provider, MD  levothyroxine (SYNTHROID, LEVOTHROID) 50 MCG tablet Take 1 tablet (50 mcg total) by mouth daily before breakfast. 06/25/16  Yes [name] [name], DO  metoprolol (LOPRESSOR) 100 MG tablet Take 1 tablet (100 mg total) by mouth 2 (two) times  daily. 01/20/16  Yes [name] [name], DO  polyethylene glycol (MIRALAX / GLYCOLAX) packet Take 17 g by mouth daily as needed for moderate constipation. Patient taking differently: Take 17 g by mouth daily as needed for moderate constipation. Mix in 8 oz liquid and drink 01/20/16  Yes [name] [name], DO  potassium chloride (K-DUR) 10 MEQ tablet Take 1 tablet (10 mEq total) by mouth every other day. 06/07/16  Yes [name] [name], DO  spironolactone (ALDACTONE) 25 MG tablet Take 1 tablet (25 mg total) by mouth daily. 01/20/16  Yes [place], DO    Family History Family History  Problem Relation Age of Onset  . Heart disease Mother   . Stroke Father   . Hypertension Daughter   . Hypertension Son   . Diabetes Sister   . Hypertension Son     Social History Social History  Substance Use Topics  . Smoking status: Never Smoker  . Smokeless tobacco: Never Used  . Alcohol use No     Allergies   Iohexol; Iodinated diagnostic agents; and Z-pak [azithromycin]   Review of Systems Review of Systems   Constitutional: Positive for fatigue. Negative for activity change.  HENT: Negative for congestion and drooling.   Respiratory: Positive for cough, chest tightness and shortness of breath.   Cardiovascular: Positive for chest pain.  Gastrointestinal: Negative for abdominal distention.  Genitourinary: Negative for dysuria.  All other systems reviewed and are negative.    Physical Exam Updated Vital Signs BP 178/100   Pulse 64   Temp 98.3 F (36.8 C) (Oral)   Resp 20   Ht 5\' 8"  (1.727 m)   Wt 164 lb (74.4 kg)   SpO2 100%   BMI 24.94 kg/m   Physical Exam  Constitutional: She appears well-developed and well-nourished.  HENT:  Head: Normocephalic and atraumatic.  Eyes: Right eye exhibits no discharge.  Cardiovascular:  No murmur heard. Irregularly irregular  Pulmonary/Chest: Breath sounds normal. No respiratory distress. She has no wheezes.  tachpnic  Abdominal: Soft. There is no tenderness.  Musculoskeletal: She exhibits no edema.  Neurological: She is alert. No cranial nerve deficit. Coordination normal.  Skin: Skin is warm and dry. She is not diaphoretic.  Psychiatric: She has a normal mood and affect.  Nursing note and vitals reviewed.    ED Treatments / Results  Labs (all labs ordered are listed, but only abnormal results are displayed) Labs Reviewed  BASIC METABOLIC PANEL - Abnormal; Notable for the following:       Result Value   Glucose, Bld 200 (*)    Creatinine, Ser 1.38 (*)    GFR calc non Af Amer 34 (*)    GFR calc Af Amer 40 (*)    All other components within normal limits  CBC - Abnormal; Notable for the following:    Hemoglobin 11.6 (*)    HCT 34.3 (*)    MCV 70.6 (*)    MCH 23.9 (*)    RDW 16.9 (*)    All other components within normal limits  D-DIMER, QUANTITATIVE (NOT AT [facility]) - Abnormal; Notable for the following:    D-Dimer, Quant 2.72 (*)    All other components within normal limits  PROTIME-INR  INFLUENZA PANEL BY PCR (TYPE A & B)    I-STAT TROPOININ, ED  I-STAT CG4 LACTIC ACID, ED  Randolm Idol, ED    EKG  EKG Interpretation  Date/Time:  Wednesday August 22 2016 15:00:41 EST Ventricular Rate:  79 PR  Interval:    QRS Duration: 110 QT Interval:  434 QTC Calculation: 497 R Axis:   73 Text Interpretation:  Atrial fibrillation No significant change since last tracing Confirmed by Gerald Leitz ([id number]) on 08/22/2016 4:43:18 PM       Radiology Dg Chest 2 View  Result Date: 08/22/2016 CLINICAL DATA:  Chest pain, shortness of breath which began this morning. History of CHF, hypertension, pulmonary hypertension, colonic malignancy, peripheral vascular disease, and diabetes. EXAM: CHEST  2 VIEW COMPARISON:  PA and lateral chest x-ray of May 01, 2016 FINDINGS: The lungs are mildly hyperinflated with hemidiaphragm flattening. There is no focal infiltrate. The cardiac silhouette is enlarged. The central pulmonary vascularity is mildly prominent. No pulmonary interstitial edema is observed. There is a small right pleural effusion. There is calcification in the wall of the aortic arch. The bony thorax exhibits no acute abnormality. IMPRESSION: COPD. Cardiomegaly with out definite pulmonary edema. Small right pleural effusion No focal pneumonia. Thoracic aortic atherosclerosis. Electronically Signed   By: David  Martinique M.D.   On: 08/22/2016 15:34    Procedures Procedures (including critical care time)  Medications Ordered in ED Medications  fentaNYL (SUBLIMAZE) injection 25 mcg (25 mcg Intravenous Given 08/22/16 1717)  aspirin chewable tablet 162 mg (162 mg Oral Given 08/22/16 1856)  fentaNYL (SUBLIMAZE) injection 25 mcg (25 mcg Intravenous Given 08/22/16 1856)     Initial Impression / Assessment and Plan / ED Course  I have reviewed the triage vital signs and the nursing notes.  Pertinent labs & imaging results that were available during my care of the patient were reviewed by me and considered in my medical  decision making (see chart for details).     83 year old female with history of hypertension, diastolic CHF, CKD, atrial fibrillation (not on anticoag) , hypothyroidism, dementia, colon cancer presenting with central chest pain. Unclear currently with this his chest pain is related to cardiac ischemia versus pulmonary embolism versus the flu. We will continued workup. Patient also has AAA. We'll run d-dimer. If positive will time the CAT scan with pulmonary phase. If negative will time the contrast with aortic phase. Given patient's elevated heart score will need admission for troponin rule out.  Patient has had intracranial hemorrhage before so is not on anticaogualtion.  8:03 PM Elevated d-dimer. Unfortunately patient has allergy to dye. However she is high risk for doing empiric anticoagulation given recent  intracranial hemorrhage (this was in September 2016) therefore discussed with radiology patient will need a VQ scan.  Discussed with internal medicine and will admit to telemetry obs.  CRITICAL CARE Performed by: Gardiner Sleeper Total critical care time: 45 minutes Critical care time was exclusive of separately billable procedures and treating other patients. Critical care was necessary to treat or prevent imminent or life-threatening deterioration. Critical care was time spent personally by me on the following activities: development of treatment plan with patient and/or surrogate as well as nursing, discussions with consultants, evaluation of patient's response to treatment, examination of patient, obtaining history from patient or surrogate, ordering and performing treatments and interventions, ordering and review of laboratory studies, ordering and review of radiographic studies, pulse oximetry and re-evaluation of patient's condition.      Final Clinical Impressions(s) / ED Diagnoses   Final diagnoses:  Positive D dimer    New Prescriptions New Prescriptions   No  medications on file     Treyvion Durkee Julio Alm, MD 08/22/16 2004

## 2016-08-22 NOTE — ED Triage Notes (Signed)
Pt presents with onset of chest pain that began today.  +shortness of breath, reports pain is constant and does not radiate;; pt denies any cough.

## 2016-08-22 NOTE — Progress Notes (Signed)
ANTICOAGULATION CONSULT NOTE - Initial Consult  Pharmacy Consult for heparin Indication: pulmonary embolus  Allergies  Allergen Reactions  . Iohexol Nausea And Vomiting  . Iodinated Diagnostic Agents Nausea And Vomiting  . Z-Pak [Azithromycin] Other (See Comments)    Unknown allergic reaction    Patient Measurements: Height: 5\' 8"  (172.7 cm) Weight: 155 lb 8 oz (70.5 kg) IBW/kg (Calculated) : 63.9  Vital Signs: Temp: 97.9 F (36.6 C) (01/31 2305) Temp Source: Oral (01/31 2305) BP: 203/115 (01/31 2305) Pulse Rate: 77 (01/31 2305)  Labs:  Recent Labs  08/22/16 1505 08/22/16 1724  HGB 11.6*  --   HCT 34.3*  --   PLT 287  --   LABPROT  --  14.8  INR  --  1.15  CREATININE 1.38*  --     Estimated Creatinine Clearance: 31.2 mL/min (by C-G formula based on SCr of 1.38 mg/dL (H)).   Medical History: Past Medical History:  Diagnosis Date  . AAA (abdominal aortic aneurysm) ([place])   . Acute upper respiratory infections of unspecified site   . Anemia   . Anxiety   . Aortic aneurysm of unspecified site without mention of rupture   . Arthritis   . Atrial fibrillation ([place])   . Cervicalgia   . Cholelithiasis   . Chronic systolic heart failure ([place])   . CKD (chronic kidney disease)   . Colon cancer ([place]) 07/1997  . Depression   . Diabetes mellitus ([place])   . Dizziness and giddiness   . Electrolyte and fluid disorders not elsewhere classified   . GERD (gastroesophageal reflux disease)   . Gout, unspecified   . Hemorrhage of rectum and anus   . Hypercholesteremia   . Hypertension   . Hypoglycemia, unspecified   . Hypopotassemia   . Hypothyroidism   . Kidney stone    renal calculi  . Malignant neoplasm of colon, unspecified site   . Other chronic allergic conjunctivitis   . Other specified cardiac dysrhythmias(427.89)   . Pain in joint, lower leg   . Perforation of bile duct   . Proteinuria   . Shortness of breath   . Small bowel obstruction due to adhesions  05/16/2012  . Stroke ([place])   . Swelling, mass, or lump in head and neck   . Thoracic aneurysm without mention of rupture    4.6 cm Asc Aortic aneurysm, CT 07/2015    Medications:  Prescriptions Prior to Admission  Medication Sig Dispense Refill Last Dose  . allopurinol (ZYLOPRIM) 100 MG tablet Take 100 mg by mouth daily.   08/22/2016 at Unknown time  . aspirin EC 81 MG tablet Take 81 mg by mouth daily.   08/22/2016 at Unknown time  . cloNIDine (CATAPRES) 0.2 MG tablet take 1 tablet by mouth three times a day for hypertension 90 tablet 5 08/22/2016 at Unknown time  . donepezil (ARICEPT) 10 MG tablet Take 1 tablet (10 mg total) by mouth at bedtime. 90 tablet 3 08/21/2016 at Unknown time  . furosemide (LASIX) 20 MG tablet Take 20 mg by mouth See admin instructions. Take 1 tablet (20 mg) by mouth every other day, may also take one additional tablet daily for weight gain greater than 3 lbs.   08/22/2016 at Unknown time  . hydrALAZINE (APRESOLINE) 50 MG tablet Take 1 tablet (50 mg total) by mouth 3 (three) times daily. 120 tablet 3 08/22/2016 at Unknown time  . isosorbide mononitrate (IMDUR) 30 MG 24 hr tablet Take 30 mg by mouth at  bedtime.   08/21/2016 at Unknown time  . levothyroxine (SYNTHROID, LEVOTHROID) 50 MCG tablet Take 1 tablet (50 mcg total) by mouth daily before breakfast. 90 tablet 1 08/22/2016 at Unknown time  . metoprolol (LOPRESSOR) 100 MG tablet Take 1 tablet (100 mg total) by mouth 2 (two) times daily. 180 tablet 3 08/22/2016 at 0800  . polyethylene glycol (MIRALAX / GLYCOLAX) packet Take 17 g by mouth daily as needed for moderate constipation. (Patient taking differently: Take 17 g by mouth daily as needed for moderate constipation. Mix in 8 oz liquid and drink) 14 each 0 Past Week at Unknown time  . potassium chloride (K-DUR) 10 MEQ tablet Take 1 tablet (10 mEq total) by mouth every other day. 45 tablet 1 08/22/2016 at Unknown time  . spironolactone (ALDACTONE) 25 MG tablet Take 1 tablet (25  mg total) by mouth daily. 90 tablet 3 08/22/2016 at Unknown time   Scheduled:  . [START ON 08/23/2016] allopurinol  100 mg Oral Daily  . [START ON 08/23/2016] aspirin EC  81 mg Oral Daily  . cloNIDine  0.2 mg Oral TID  . donepezil  10 mg Oral QHS  . [START ON 08/24/2016] furosemide  20 mg Oral See admin instructions  . hydrALAZINE  50 mg Oral TID  . isosorbide mononitrate  30 mg Oral QHS  . [START ON 08/23/2016] levothyroxine  50 mcg Oral QAC breakfast  . metoprolol  100 mg Oral BID  . morphine      . [START ON 08/24/2016] potassium chloride  10 mEq Oral QODAY  . [START ON 08/23/2016] spironolactone  25 mg Oral Daily    Assessment: 83yo female c/o CP and SOB, has h/o Afib but not on anticoag 2/2 ICH Sept 2016, D-dimer elevated, VQ shows intermediate probability of PE, to begin heparin; MD requests NO bolus 2/2 ICH hx.  Goal of Therapy:  Heparin level 0.3-0.7 units/ml Monitor platelets by anticoagulation protocol: Yes   Plan:  Will begin heparin gtt at 1000 units/hr and monitor heparin levels and CBC.  Wynona Neat, PharmD, BCPS  08/22/2016,11:35 PM

## 2016-08-22 NOTE — H&P (Signed)
History and Physical    Nicole [name] [id number] DOB: 1930/09/25 DOA: 08/22/2016  Referring MD/NP/PA: Dr. Thomasene Lot PCP: Hollace Kinnier, DO  Patient coming from:   Chief Complaint: Chest pain  HPI: Nicole [name] is a 83 y.o. female with medical history significant of HTN, HLD, A. Fib not on anticoagulation, ICH '16, AAA, dCHF (lasted EF 60-65% in 03/2016), hypothyroidism, mild dementia, and colon cancer '99; who presents with acute onset of chest pain this morning after she woke up. Pain is substernal, constant, achy, and rates it a 8 out of 10 on the pain scale. Symptoms worsened with taking deep breaths or exerting herself. Associated symptoms include shortness of breath and lightheadedness. Denies any cough, diaphoresis, leg swelling, calf pain, fever, chills, nausea, vomiting, diarrhea, weight loss, LOC, recent travel, sick contacts, or prolonged sedimentary periods. Patient is only on a daily aspirin. From review of notes it appears that although patient has a high chadsvasc score previous anticoagulation of warfarin had been stopped due to history of ICH.  ED Course: Upon admission into emergency department patient was seen to be afebrile, pulse 5886, respirations 14-25, blood pressure as high as 192/101, and O2 saturations maintained on 2 L nasal cannula oxygen. Patient was never hypoxic but stated that oxygen made her feel better. I work revealed hemoglobin 11.6, BUN 19, creatinine 1.38, glucose 200, INR 1.15, D-dimer 1.67, and trop 0.0 x2. Chest x-ray showed signs of COPD and cardiomegaly with small right pleural effusion no overt pulmonary edema. VQ scan ordered due to patient history of contrast allergy.     Review of Systems: As per HPI otherwise 10 point review of systems negative.   Past Medical History:  Diagnosis Date  . AAA (abdominal aortic aneurysm) ([place])   . Acute upper respiratory infections of unspecified site   . Anemia   . Anxiety   . Aortic aneurysm of  unspecified site without mention of rupture   . Arthritis   . Atrial fibrillation ([place])   . Cervicalgia   . Cholelithiasis   . Chronic systolic heart failure ([place])   . CKD (chronic kidney disease)   . Colon cancer ([place]) 07/1997  . Depression   . Diabetes mellitus ([place])   . Dizziness and giddiness   . Electrolyte and fluid disorders not elsewhere classified   . GERD (gastroesophageal reflux disease)   . Gout, unspecified   . Hemorrhage of rectum and anus   . Hypercholesteremia   . Hypertension   . Hypoglycemia, unspecified   . Hypopotassemia   . Hypothyroidism   . Kidney stone    renal calculi  . Malignant neoplasm of colon, unspecified site   . Other chronic allergic conjunctivitis   . Other specified cardiac dysrhythmias(427.89)   . Pain in joint, lower leg   . Perforation of bile duct   . Proteinuria   . Shortness of breath   . Small bowel obstruction due to adhesions 05/16/2012  . Stroke ([place])   . Swelling, mass, or lump in head and neck   . Thoracic aneurysm without mention of rupture    4.6 cm Asc Aortic aneurysm, CT 07/2015    Past Surgical History:  Procedure Laterality Date  . CARDIAC CATHETERIZATION  2008   moderate severe pulm HTN; with elevted pulm capillary wedge pressure   . CHOLECYSTECTOMY  05/14/2012   Procedure: LAPAROSCOPIC CHOLECYSTECTOMY WITH INTRAOPERATIVE CHOLANGIOGRAM;  Surgeon: Haywood Lasso, MD;  Location: [facility];  Service: General;  Laterality: N/A;  . COLON SURGERY  to remove colon ca  . ERCP  05/17/2012   Procedure: ENDOSCOPIC RETROGRADE CHOLANGIOPANCREATOGRAPHY (ERCP);  Surgeon: Missy Sabins, MD;  Location: [facility];  Service: Gastroenterology;  Laterality: N/A;  . ERCP  08/12/2012   Procedure: ENDOSCOPIC RETROGRADE CHOLANGIOPANCREATOGRAPHY (ERCP);  Surgeon: Missy Sabins, MD;  Location[facility] ENDOSCOPY;  Service: Endoscopy;  Laterality: N/A;  . HEMICOLECTOMY  08/11/1997  . KIDNEY STONE SURGERY    . TRANSTHORACIC ECHOCARDIOGRAM  11/2009    EF 45-50%, mild-mod AV regurg; mild MR; mod TR; ascending aorta mildly dilated     reports that she has never smoked. She has never used smokeless tobacco. She reports that she does not drink alcohol or use drugs.  Allergies  Allergen Reactions  . Iohexol Nausea And Vomiting  . Iodinated Diagnostic Agents Nausea And Vomiting  . Z-Pak [Azithromycin] Other (See Comments)    Unknown allergic reaction    Family History  Problem Relation Age of Onset  . Heart disease Mother   . Stroke Father   . Hypertension Daughter   . Hypertension Son   . Diabetes Sister   . Hypertension Son     Prior to Admission medications   Medication Sig Start Date End Date Taking? Authorizing Provider  allopurinol (ZYLOPRIM) 100 MG tablet Take 100 mg by mouth daily.   Yes Historical Provider, MD  aspirin EC 81 MG tablet Take 81 mg by mouth daily.   Yes Historical Provider, MD  cloNIDine (CATAPRES) 0.2 MG tablet take 1 tablet by mouth three times a day for hypertension 08/06/16  Yes [name] [name], DO  donepezil (ARICEPT) 10 MG tablet Take 1 tablet (10 mg total) by mouth at bedtime. 01/09/16  Yes [name] [name], DO  furosemide (LASIX) 20 MG tablet Take 20 mg by mouth See admin instructions. Take 1 tablet (20 mg) by mouth every other day, may also take one additional tablet daily for weight gain greater than 3 lbs.   Yes Historical Provider, MD  hydrALAZINE (APRESOLINE) 50 MG tablet Take 1 tablet (50 mg total) by mouth 3 (three) times daily. 04/12/16  Yes Costin Karlyne Greenspan, MD  isosorbide mononitrate (IMDUR) 30 MG 24 hr tablet Take 30 mg by mouth at bedtime.   Yes Historical Provider, MD  levothyroxine (SYNTHROID, LEVOTHROID) 50 MCG tablet Take 1 tablet (50 mcg total) by mouth daily before breakfast. 06/25/16  Yes [name] [name], DO  metoprolol (LOPRESSOR) 100 MG tablet Take 1 tablet (100 mg total) by mouth 2 (two) times daily. 01/20/16  Yes [name] [name], DO  polyethylene glycol (MIRALAX / GLYCOLAX) packet Take 17 g  by mouth daily as needed for moderate constipation. Patient taking differently: Take 17 g by mouth daily as needed for moderate constipation. Mix in 8 oz liquid and drink 01/20/16  Yes [name] [name], DO  potassium chloride (K-DUR) 10 MEQ tablet Take 1 tablet (10 mEq total) by mouth every other day. 06/07/16  Yes [name] [name], DO  spironolactone (ALDACTONE) 25 MG tablet Take 1 tablet (25 mg total) by mouth daily. 01/20/16  Yes [name], DO    Physical Exam:    Constitutional: Elderly female who appears to be in NAD, calm, comfortable Vitals:   08/22/16 1815 08/22/16 1830 08/22/16 1845 08/22/16 1900  BP: (!) 157/101 158/97 (!) 166/106 178/100  Pulse: 68 68 (!) 58 64  Resp: 21 25 19 20   Temp:      TempSrc:      SpO2: 100% 100% 99% 100%  Weight:      Height:       Eyes: PERRL, lids and conjunctivae normal ENMT: Mucous membranes are moist. Posterior pharynx clear of any exudate or lesions.  Neck: normal, supple, no masses, no thyromegaly Respiratory: clear to auscultation bilaterally, no wheezing, no crackles. Respiratory effort mildly tachypneic. No accessory muscle use.  Cardiovascular: Irregular irregular, no murmurs / rubs / gallops. No extremity edema. 2+ pedal pulses. No carotid bruits.  Chest soreness reproducible with palpation. Abdomen: no tenderness, no masses palpated. No hepatosplenomegaly. Bowel sounds positive.  Musculoskeletal: no clubbing / cyanosis. No joint deformity upper and lower extremities. Good ROM, no contractures. Normal muscle tone.  Skin: no rashes, lesions, ulcers. No induration Neurologic: CN 2-12 grossly intact. Sensation intact, DTR normal. Strength 5/5 in all 4.  Psychiatric: Normal judgment and insight. Alert and oriented x 3. Normal mood. Mild memory impairment.    Labs on Admission: I have personally reviewed following labs and imaging studies  CBC:  Recent Labs Lab 08/22/16 1505  WBC 6.6  HGB 11.6*  HCT 34.3*  MCV 70.6*  PLT [id number]    Basic Metabolic Panel:  Recent Labs Lab 08/22/16 1505  NA 139  K 4.2  CL 108  CO2 24  GLUCOSE 200*  BUN 19  CREATININE 1.38*  CALCIUM 9.1   GFR: Estimated Creatinine Clearance: 31.2 mL/min (by C-G formula based on SCr of 1.38 mg/dL (H)). Liver Function Tests: No results for input(s): AST, ALT, ALKPHOS, BILITOT, PROT, ALBUMIN in the last 168 hours. No results for input(s): LIPASE, AMYLASE in the last 168 hours. No results for input(s): AMMONIA in the last 168 hours. Coagulation Profile:  Recent Labs Lab 08/22/16 1724  INR 1.15   Cardiac Enzymes: No results for input(s): CKTOTAL, CKMB, CKMBINDEX, TROPONINI in the last 168 hours. BNP (last 3 results) No results for input(s): PROBNP in the last 8760 hours. HbA1C: No results for input(s): HGBA1C in the last 72 hours. CBG: No results for input(s): GLUCAP in the last 168 hours. Lipid Profile: No results for input(s): CHOL, HDL, LDLCALC, TRIG, CHOLHDL, LDLDIRECT in the last 72 hours. Thyroid Function Tests: No results for input(s): TSH, T4TOTAL, FREET4, T3FREE, THYROIDAB in the last 72 hours. Anemia Panel: No results for input(s): VITAMINB12, FOLATE, FERRITIN, TIBC, IRON, RETICCTPCT in the last 72 hours. Urine analysis:    Component Value Date/Time   COLORURINE YELLOW 07/20/2016 2021   APPEARANCEUR CLEAR 07/20/2016 2021   LABSPEC 1.013 07/20/2016 2021   PHURINE 5.0 07/20/2016 2021   GLUCOSEU NEGATIVE 07/20/2016 2021   HGBUR NEGATIVE 07/20/2016 2021   [place] NEGATIVE 07/20/2016 2021   KETONESUR NEGATIVE 07/20/2016 2021   PROTEINUR NEGATIVE 07/20/2016 2021   UROBILINOGEN 2.0 (H) 04/13/2015 1844   NITRITE NEGATIVE 07/20/2016 2021   LEUKOCYTESUR NEGATIVE 07/20/2016 2021   Sepsis Labs: No results found for this or any previous visit (from the past 240 hour(s)).   Radiological Exams on Admission: Dg Chest 2 View  Result Date: 08/22/2016 CLINICAL DATA:  Chest pain, shortness of breath which began this morning.  History of CHF, hypertension, pulmonary hypertension, colonic malignancy, peripheral vascular disease, and diabetes. EXAM: CHEST  2 VIEW COMPARISON:  PA and lateral chest x-ray of May 01, 2016 FINDINGS: The lungs are mildly hyperinflated with hemidiaphragm flattening. There is no focal infiltrate. The cardiac silhouette is enlarged. The central pulmonary vascularity is mildly prominent. No pulmonary interstitial edema is observed. There is a small right pleural effusion. There is calcification in the wall of the aortic  arch. The bony thorax exhibits no acute abnormality. IMPRESSION: COPD. Cardiomegaly with out definite pulmonary edema. Small right pleural effusion No focal pneumonia. Thoracic aortic atherosclerosis. Electronically Signed   By: David  Martinique M.D.   On: 08/22/2016 15:34    EKG: Independently reviewed. Atrial fibrillation similar to previous tracings.  Assessment/Plan Chest pain: Acute. Patient presents with centralized chest pain that is achy in nature. It chest pain symptoms appear to be reproducible on physical exam, but elevated d-dimer was found. Initial troponins found to be negative 2. VQ scan ordered due to patient history of contrast allergy. Question the possibility of pulmonary emboli.  - Admit telemetry  - Follow-up VQ scan - If positive will likely start on heparin drip without bolus her pharmacy  - Check echocardiogram - Consider need of vascular surgery consult for IVC filter versus retrial of anticoagulation if positive VQ scan  Diastolic congestive heart failure: Patient does not appear to be overtly fluid overloaded at this time. Last echocardiogram showed EF of 60-65% in 03/2016. - Strict ins and outs  - Check BNP - continue isosorbide mononitrate, hydralazine, metoprolol, spironolactone, and furosemide  Atrial fibrillation not on anticoagulation: Chadsvasc score = 6 . It appears patient not on anticoagulation secondary to history of ICH back in 2016. Patient  currently rate controlled. - Continue aspirin and rate controlling medications  Dyspnea  - Continuous pulse oximetry - duonebs   Essential hypertension: Patient's blood pressure same. Significant elevated up to 192/101 as patient has not received her nighttime doses of her blood pressure medications. - Continue clonidine and all other medications as seen above    Hypothyroidism - Continue levothyroxine  Dementia - Continue Aricept   Anemia - Repeat CBC in a.m  History of diabetes mellitus type 2  - SSI  History of CVA and ICH: Previously noted back in 2016.  Chronic kidney disease stage III: Stable. Baseline creatinine between 1.28-1.69 -  Recheck BMP in a.m.  DVT prophylaxis: Lovenox Code Status: Full Family Communication:  discussed overall plan of care with the patient and her husband at bedside.  Disposition Plan: Likely discharge home   Consults called: None Admission status: Observation   Norval Morton MD [facility] Pager [phone]  If 7PM-7AM, please contact night-coverage [email or web address] Password [facility]  08/22/2016, 7:58 PM

## 2016-08-23 ENCOUNTER — Observation Stay (HOSPITAL_BASED_OUTPATIENT_CLINIC_OR_DEPARTMENT_OTHER): Payer: Medicare Other

## 2016-08-23 ENCOUNTER — Observation Stay (HOSPITAL_COMMUNITY): Payer: Medicare Other

## 2016-08-23 DIAGNOSIS — I2699 Other pulmonary embolism without acute cor pulmonale: Secondary | ICD-10-CM | POA: Diagnosis not present

## 2016-08-23 DIAGNOSIS — R0789 Other chest pain: Secondary | ICD-10-CM | POA: Diagnosis not present

## 2016-08-23 DIAGNOSIS — E039 Hypothyroidism, unspecified: Secondary | ICD-10-CM | POA: Diagnosis not present

## 2016-08-23 DIAGNOSIS — R079 Chest pain, unspecified: Secondary | ICD-10-CM | POA: Diagnosis not present

## 2016-08-23 LAB — BASIC METABOLIC PANEL
Anion gap: 8 (ref 5–15)
BUN: 18 mg/dL (ref 6–20)
CALCIUM: 8.7 mg/dL — AB (ref 8.9–10.3)
CO2: 23 mmol/L (ref 22–32)
CREATININE: 1.24 mg/dL — AB (ref 0.44–1.00)
Chloride: 108 mmol/L (ref 101–111)
GFR calc non Af Amer: 39 mL/min — ABNORMAL LOW (ref >60)
GFR, EST AFRICAN AMERICAN: 45 mL/min — AB (ref >60)
Glucose, Bld: 163 mg/dL — ABNORMAL HIGH (ref 65–99)
Potassium: 4.2 mmol/L (ref 3.5–5.1)
SODIUM: 139 mmol/L (ref 135–145)

## 2016-08-23 LAB — TROPONIN I: Troponin I: <0.03 ng/mL (ref <0.03)

## 2016-08-23 LAB — ECHOCARDIOGRAM COMPLETE
HEIGHTINCHES: 68 in
WEIGHTICAEL: 2488 [oz_av]

## 2016-08-23 LAB — CBC WITH DIFFERENTIAL/PLATELET
BASOS PCT: 0 %
Basophils Absolute: 0 10*3/uL (ref 0.0–0.1)
EOS ABS: 0 10*3/uL (ref 0.0–0.7)
Eosinophils Relative: 0 %
HCT: 31.6 % — ABNORMAL LOW (ref 36.0–46.0)
Hemoglobin: 10.6 g/dL — ABNORMAL LOW (ref 12.0–15.0)
Lymphocytes Relative: 6 %
Lymphs Abs: 0.8 10*3/uL (ref 0.7–4.0)
MCH: 23.6 pg — ABNORMAL LOW (ref 26.0–34.0)
MCHC: 33.5 g/dL (ref 30.0–36.0)
MCV: 70.2 fL — ABNORMAL LOW (ref 78.0–100.0)
MONO ABS: 1.6 10*3/uL — AB (ref 0.1–1.0)
MONOS PCT: 14 %
NEUTROS PCT: 80 %
Neutro Abs: 9.7 10*3/uL — ABNORMAL HIGH (ref 1.7–7.7)
Platelets: 267 10*3/uL (ref 150–400)
RBC: 4.5 MIL/uL (ref 3.87–5.11)
RDW: 16.7 % — AB (ref 11.5–15.5)
WBC: 12.1 10*3/uL — ABNORMAL HIGH (ref 4.0–10.5)

## 2016-08-23 LAB — GLUCOSE, CAPILLARY
GLUCOSE-CAPILLARY: 188 mg/dL — AB (ref 65–99)
Glucose-Capillary: 149 mg/dL — ABNORMAL HIGH (ref 65–99)
Glucose-Capillary: 170 mg/dL — ABNORMAL HIGH (ref 65–99)
Glucose-Capillary: 140 mg/dL — ABNORMAL HIGH (ref 65–99)

## 2016-08-23 LAB — HEPARIN LEVEL (UNFRACTIONATED)
HEPARIN UNFRACTIONATED: 0.34 [IU]/mL (ref 0.30–0.70)
Heparin Unfractionated: 0.24 IU/mL — ABNORMAL LOW (ref 0.30–0.70)

## 2016-08-23 MED ORDER — SODIUM CHLORIDE 0.9 % IV SOLN
INTRAVENOUS | Status: DC
  Administered 2016-08-23 – 2016-08-25 (×4): via INTRAVENOUS
  Filled 2016-08-23 (×3): qty 1000

## 2016-08-23 MED ORDER — IOPAMIDOL (ISOVUE-370) INJECTION 76%
INTRAVENOUS | Status: AC
  Administered 2016-08-23: 100 mL
  Filled 2016-08-23: qty 100

## 2016-08-23 MED ORDER — INSULIN ASPART 100 UNIT/ML ~~LOC~~ SOLN
0.0000 [IU] | Freq: Three times a day (TID) | SUBCUTANEOUS | Status: DC
  Administered 2016-08-23: 2 [IU] via SUBCUTANEOUS
  Administered 2016-08-23 – 2016-08-24 (×3): 1 [IU] via SUBCUTANEOUS
  Administered 2016-08-25: 2 [IU] via SUBCUTANEOUS
  Administered 2016-08-25: 1 [IU] via SUBCUTANEOUS
  Administered 2016-08-26: 2 [IU] via SUBCUTANEOUS

## 2016-08-23 NOTE — Progress Notes (Signed)
ANTICOAGULATION CONSULT NOTE - Follow up [place] for heparin Indication: pulmonary embolus  Allergies  Allergen Reactions  . Iohexol Nausea And Vomiting  . Iodinated Diagnostic Agents Nausea And Vomiting  . Z-Pak [Azithromycin] Other (See Comments)    Unknown allergic reaction    Patient Measurements: Height: 5\' 8"  (172.7 cm) Weight: 155 lb 8 oz (70.5 kg) IBW/kg (Calculated) : 63.9  Vital Signs: Temp: 98.3 F (36.8 C) (02/01 1704) Temp Source: Oral (02/01 1704) BP: 136/82 (02/01 1704) Pulse Rate: 69 (02/01 1704)  Labs:  Recent Labs  08/22/16 1505 08/22/16 1724 08/23/16 0347 08/23/16 0747 08/23/16 1646  HGB 11.6*  --  10.6*  --   --   HCT 34.3*  --  31.6*  --   --   PLT 287  --  267  --   --   LABPROT  --  14.8  --   --   --   INR  --  1.15  --   --   --   HEPARINUNFRC  --   --   --  0.24* 0.34  CREATININE 1.38*  --  1.24*  --   --   TROPONINI  --   --  <0.03  --   --     Estimated Creatinine Clearance: 34.7 mL/min (by C-G formula based on SCr of 1.24 mg/dL (H)).   Assessment: 83yo female c/o CP and SOB, has h/o Afib but not on anticoag 2/2 ICH Sept 2016, D-dimer elevated, VQ shows intermediate probability of PE, to begin heparin; MD requests NO bolus 2/2 ICH hx. No bleeding noted. CBC stable  Hep lvl 0.34 on 1150 units/hr within goal  Goal of Therapy:  Heparin level 0.3-0.7 units/ml Monitor platelets by anticoagulation protocol: Yes   Plan:  Continue heparin gtt  1150 units/hr  Daily HL, CBC  Levester Fresh, PharmD, BCPS, BCCCP Clinical Pharmacist 08/23/2016 5:30 PM

## 2016-08-23 NOTE — Progress Notes (Signed)
  Echocardiogram 2D Echocardiogram has been performed.  Nicole [name] 08/23/2016, 12:41 PM

## 2016-08-23 NOTE — Progress Notes (Signed)
*  PRELIMINARY RESULTS* Vascular Ultrasound BIlateral lower extremity venous duplex has been completed.  Preliminary findings: No evidence of deep vein thrombosis or baker's cysts bilaterally.   Everrett Coombe 08/23/2016, 4:00 PM

## 2016-08-23 NOTE — Progress Notes (Signed)
PROGRESS NOTE    Nicole [name]  [id number] DOB: Jan 14, 1931 DOA: 08/22/2016 PCP: Hollace Kinnier, DO  Outpatient Specialists: Cardiologist: Dr. Debara Pickett Neurologist: Dr. Leonie Man   Brief Narrative: Nicole [name] is an 83 year old female with a PMH of HTN, HLD, A-fib (not on anticoagulation), ICH 2016 while on Coumadin, AAA, dCHF, hypothyroidism, mild dementia, and colon cancer 1999 who presented to the ED with acute, substernal, constant chest pain that was worse with deep breaths and exertion. On admission, BP was elevated to 192/101. Initial work-up revealed d-dimer 1.67 and trop 0.00 x 2. CXR without focal abnormalities. Pt with A-fib and high CHADSVASC score, but not on any anticoagulation due to [place] on warfarin in 2016. V/Q scan performed due to patient's contrast allergy, which showed mismatch in the right lower lobe with intermediate probability of PE. She was started on Heparin gtt on 2/1.   Assessment & Plan: Chest Pain: Pt with elevated d-dimer and V/Q scan with intermediate probability of PE in the right lower lobe. Troponins negative. - Continue heparin gtt - F/u ECHO - LE dopplers pending - Obtain CTA chest. Pt has a listed allergy of nausea/vomiting to contrast, but this is not a true allergy. Discussed with patient and family, who do not think she has ever had throat swelling with contrast. Discussed with radiologist, who agrees that she should be fine to get a CTA chest and does not need to be pre-medicated. Think benefits of CTA chest outweigh the risks. - Discussed possible anticoagulation with Dr. Leonel Ramsay (Neurology). He states that anticoagulation in a patient with a history of ICH is a risk-benefit discussion. Dr. Leonel Ramsay would lean towards anticoagulation if patient was unstable, which is not the case. Unfortunately, there is no information that would allow [place] to predict what her risk of re-bleed would be. Will need to discuss with patient and family.  Diastolic Heart  Failure: Last ECHO 60-65% in 03/2016. No fluid overload on exam. BNP 490 (previously [phone]). - Continue isosorbide mononitrate, hydralazine, metoprolol - Hold Lasix and Spironolactone and start gentle fluids, as Pt is getting contrast today  Chronic Atrial Fibrillation: CHADSVASC score = 6 . It appears patient not on anticoagulation secondary to history of ICH back in 2016. Patient currently rate controlled. - Continue Aspirin and Metoprolol  Dyspnea  - Continuous pulse oximetry - Duonebs   Essential hypertension: BP initially 192/101, now normotensive - Continue clonidine, hydralazine, metoprolol - Holding spironolactone, as Pt is getting contrast today  Hypothyroidism - Continue levothyroxine  Dementia - Continue Aricept   Microcytic Anemia: Hgb trending down from 11.6 > 10.6. No active bleeding. - Trend CBC  Diabetes mellitus type 2  - Sensitive SSI  History of CVA and ICH: Previously noted back in 2016.  Chronic kidney disease stage III: Stable. Baseline creatinine between 1.28-1.69 - Pt getting contrast today- hold spironolactone and lasix and start gentle IVFs -  Trend Cr   DVT prophylaxis: Heparin gtt Code Status: DNR Family Communication: [name] (friend) Disposition Plan: Pending PT/OT/ST evaluations and MRI. Hopeful for discharge 2/2.   Consultants:   None  Procedures:   None  Antimicrobials:  None   Subjective: Pt states she is still having chest pain this morning. The pain is worse with taking deep breaths. Morphine helping a little.  Objective: Vitals:   08/23/16 0138 08/23/16 0347 08/23/16 0350 08/23/16 0600  BP: 140/90  124/88 120/70  Pulse: 67 69 65 66  Resp: (!) 22 18 18 14   Temp:   98.1 F (  36.7 C)   TempSrc:   Oral   SpO2: 100% 100% 100% 100%  Weight:      Height:       No intake or output data in the 24 hours ending 08/23/16 0744 Filed Weights   08/22/16 1501 08/22/16 2305  Weight: 164 lb (74.4 kg) 155 lb 8 oz (70.5  kg)    Examination:  General exam: Appears mildly uncomfortable Respiratory system: Clear to auscultation. Taking shallow breaths. Cardiovascular system: S1 & S2 heard, irregularly irregular rhythm, normal rate. No JVD, murmurs, rubs, gallops or clicks. No pedal edema. No tenderness to palpation of the sternum. Gastrointestinal system: Abdomen is nondistended, soft and nontender. No organomegaly or masses felt. Normal bowel sounds heard. Central nervous system: Alert and oriented. No focal neurological deficits. Extremities: Symmetric 5/5 strength Skin: No rashes, lesions or ulcers Psychiatry: Judgement and insight appear normal. Mood & affect appropriate.     Data Reviewed: I have personally reviewed following labs and imaging studies  CBC:  Recent Labs Lab 08/22/16 1505 08/23/16 0347  WBC 6.6 12.1*  NEUTROABS  --  9.7*  HGB 11.6* 10.6*  HCT 34.3* 31.6*  MCV 70.6* 70.2*  PLT 287 [id number]   Basic Metabolic Panel:  Recent Labs Lab 08/22/16 1505 08/23/16 0347  NA 139 139  K 4.2 4.2  CL 108 108  CO2 24 23  GLUCOSE 200* 163*  BUN 19 18  CREATININE 1.38* 1.24*  CALCIUM 9.1 8.7*   GFR: Estimated Creatinine Clearance: 34.7 mL/min (by C-G formula based on SCr of 1.24 mg/dL (H)). Liver Function Tests: No results for input(s): AST, ALT, ALKPHOS, BILITOT, PROT, ALBUMIN in the last 168 hours. No results for input(s): LIPASE, AMYLASE in the last 168 hours. No results for input(s): AMMONIA in the last 168 hours. Coagulation Profile:  Recent Labs Lab 08/22/16 1724  INR 1.15   Cardiac Enzymes:  Recent Labs Lab 08/23/16 0347  TROPONINI <0.03   BNP (last 3 results) No results for input(s): PROBNP in the last 8760 hours. HbA1C: No results for input(s): HGBA1C in the last 72 hours. CBG: No results for input(s): GLUCAP in the last 168 hours. Lipid Profile: No results for input(s): CHOL, HDL, LDLCALC, TRIG, CHOLHDL, LDLDIRECT in the last 72 hours. Thyroid Function  Tests: No results for input(s): TSH, T4TOTAL, FREET4, T3FREE, THYROIDAB in the last 72 hours. Anemia Panel: No results for input(s): VITAMINB12, FOLATE, FERRITIN, TIBC, IRON, RETICCTPCT in the last 72 hours. Urine analysis:    Component Value Date/Time   COLORURINE YELLOW 07/20/2016 2021   APPEARANCEUR CLEAR 07/20/2016 2021   LABSPEC 1.013 07/20/2016 2021   PHURINE 5.0 07/20/2016 2021   GLUCOSEU NEGATIVE 07/20/2016 2021   HGBUR NEGATIVE 07/20/2016 2021   [place] NEGATIVE 07/20/2016 2021   KETONESUR NEGATIVE 07/20/2016 2021   PROTEINUR NEGATIVE 07/20/2016 2021   UROBILINOGEN 2.0 (H) 04/13/2015 1844   NITRITE NEGATIVE 07/20/2016 2021   LEUKOCYTESUR NEGATIVE 07/20/2016 2021    Radiology Studies: Dg Chest 2 View  Result Date: 08/22/2016 CLINICAL DATA:  Chest pain, shortness of breath which began this morning. History of CHF, hypertension, pulmonary hypertension, colonic malignancy, peripheral vascular disease, and diabetes. EXAM: CHEST  2 VIEW COMPARISON:  PA and lateral chest x-ray of May 01, 2016 FINDINGS: The lungs are mildly hyperinflated with hemidiaphragm flattening. There is no focal infiltrate. The cardiac silhouette is enlarged. The central pulmonary vascularity is mildly prominent. No pulmonary interstitial edema is observed. There is a small right pleural effusion. There is calcification in  the wall of the aortic arch. The bony thorax exhibits no acute abnormality. IMPRESSION: COPD. Cardiomegaly with out definite pulmonary edema. Small right pleural effusion No focal pneumonia. Thoracic aortic atherosclerosis. Electronically Signed   By: David  Martinique M.D.   On: 08/22/2016 15:34   Nm Pulmonary Perf And Vent  Result Date: 08/22/2016 CLINICAL DATA:  83 y/o F; chest pain and shortness of breath. Positive D-dimer. EXAM: NUCLEAR MEDICINE VENTILATION - PERFUSION LUNG SCAN TECHNIQUE: Ventilation images were obtained in multiple projections using inhaled aerosol Tc-69m DTPA.  Perfusion images were obtained in multiple projections after intravenous injection of Tc-9m MAA. RADIOPHARMACEUTICALS:  31 mCi Technetium-21m DTPA aerosol inhalation and 3.9 mCi Technetium-27m MAA IV COMPARISON:  08/22/2016 chest radiograph. FINDINGS: Ventilation: Diffusely heterogeneous ventilation probably representing airways disease. Perfusion: Large wedge-shaped mismatched ventilation perfusion defect within the right lower lobe. No radiographic correlate. IMPRESSION: 1. Single large V/Q mismatch in the right lower lobe. Intermediate probability of pulmonary embolus. 2. Diffuse heterogeneity of ventilation tracer uptake likely represents airways disease. Electronically Signed   By: Kristine Garbe M.D.   On: 08/22/2016 22:17        Scheduled Meds: . allopurinol  100 mg Oral Daily  . aspirin EC  81 mg Oral Daily  . cloNIDine  0.2 mg Oral TID  . donepezil  10 mg Oral QHS  . [START ON 08/24/2016] furosemide  20 mg Oral See admin instructions  . hydrALAZINE  50 mg Oral TID  . insulin aspart  0-9 Units Subcutaneous TID WC  . isosorbide mononitrate  30 mg Oral QHS  . levothyroxine  50 mcg Oral QAC breakfast  . metoprolol  100 mg Oral BID  . morphine      . [START ON 08/24/2016] potassium chloride  10 mEq Oral QODAY  . spironolactone  25 mg Oral Daily   Continuous Infusions: . heparin 1,000 Units/hr (08/23/16 0028)     LOS: 0 days    Evette Doffing, MD Family Medicine Resident, PGY-2  If 7PM-7AM, please contact night-coverage [email or web address] Password [facility] 08/23/2016, 7:44 AM

## 2016-08-23 NOTE — Progress Notes (Signed)
ANTICOAGULATION CONSULT NOTE - Follow up [place] for heparin Indication: pulmonary embolus  Allergies  Allergen Reactions  . Iohexol Nausea And Vomiting  . Iodinated Diagnostic Agents Nausea And Vomiting  . Z-Pak [Azithromycin] Other (See Comments)    Unknown allergic reaction    Patient Measurements: Height: 5\' 8"  (172.7 cm) Weight: 155 lb 8 oz (70.5 kg) IBW/kg (Calculated) : 63.9  Vital Signs: Temp: 98.3 F (36.8 C) (02/01 0815) Temp Source: Oral (02/01 0815) BP: 133/91 (02/01 0815) Pulse Rate: 79 (02/01 0815)  Labs:  Recent Labs  08/22/16 1505 08/22/16 1724 08/23/16 0347 08/23/16 0747  HGB 11.6*  --  10.6*  --   HCT 34.3*  --  31.6*  --   PLT 287  --  267  --   LABPROT  --  14.8  --   --   INR  --  1.15  --   --   HEPARINUNFRC  --   --   --  0.24*  CREATININE 1.38*  --  1.24*  --   TROPONINI  --   --  <0.03  --     Estimated Creatinine Clearance: 34.7 mL/min (by C-G formula based on SCr of 1.24 mg/dL (H)).   Medical History: Past Medical History:  Diagnosis Date  . AAA (abdominal aortic aneurysm) ([place])   . Acute upper respiratory infections of unspecified site   . Anemia   . Anxiety   . Aortic aneurysm of unspecified site without mention of rupture   . Arthritis   . Atrial fibrillation ([place])   . Cervicalgia   . Cholelithiasis   . Chronic systolic heart failure ([place])   . CKD (chronic kidney disease)   . Colon cancer ([place]) 07/1997  . Depression   . Diabetes mellitus ([place])   . Dizziness and giddiness   . Electrolyte and fluid disorders not elsewhere classified   . GERD (gastroesophageal reflux disease)   . Gout, unspecified   . Hemorrhage of rectum and anus   . Hypercholesteremia   . Hypertension   . Hypoglycemia, unspecified   . Hypopotassemia   . Hypothyroidism   . Kidney stone    renal calculi  . Malignant neoplasm of colon, unspecified site   . Other chronic allergic conjunctivitis   . Other specified cardiac  dysrhythmias(427.89)   . Pain in joint, lower leg   . Perforation of bile duct   . Proteinuria   . Shortness of breath   . Small bowel obstruction due to adhesions 05/16/2012  . Stroke ([place])   . Swelling, mass, or lump in head and neck   . Thoracic aneurysm without mention of rupture    4.6 cm Asc Aortic aneurysm, CT 07/2015    Medications:  See EMR   Assessment: 83yo female c/o CP and SOB, has h/o Afib but not on anticoag 2/2 ICH Sept 2016, D-dimer elevated, VQ shows intermediate probability of PE, to begin heparin; MD requests NO bolus 2/2 ICH hx. Heparin level subtherapeutic this morning at 0.24. No bleeding noted. CBC stable  Goal of Therapy:  Heparin level 0.3-0.7 units/ml Monitor platelets by anticoagulation protocol: Yes   Plan:  Increase heparin gtt to 1150 units/hr  Check anti-Xa level in 8 hours and daily while on heparin Continue to monitor H&H and platelets   Thank you for allowing [place] to participate in this patients care.  Jens Som, PharmD Clinical phone for 08/23/2016 from 7a-3:30p: x [phone] If after 3:30p, please call main pharmacy  at: [phone] 08/23/2016 10:07 AM

## 2016-08-24 ENCOUNTER — Ambulatory Visit: Payer: Medicare Other | Admitting: Nurse Practitioner

## 2016-08-24 ENCOUNTER — Encounter (HOSPITAL_COMMUNITY): Payer: Self-pay | Admitting: General Practice

## 2016-08-24 DIAGNOSIS — I5032 Chronic diastolic (congestive) heart failure: Secondary | ICD-10-CM | POA: Diagnosis present

## 2016-08-24 DIAGNOSIS — I130 Hypertensive heart and chronic kidney disease with heart failure and stage 1 through stage 4 chronic kidney disease, or unspecified chronic kidney disease: Secondary | ICD-10-CM | POA: Diagnosis present

## 2016-08-24 DIAGNOSIS — E039 Hypothyroidism, unspecified: Secondary | ICD-10-CM | POA: Diagnosis not present

## 2016-08-24 DIAGNOSIS — N183 Chronic kidney disease, stage 3 (moderate): Secondary | ICD-10-CM | POA: Diagnosis present

## 2016-08-24 DIAGNOSIS — Z823 Family history of stroke: Secondary | ICD-10-CM | POA: Diagnosis not present

## 2016-08-24 DIAGNOSIS — Z888 Allergy status to other drugs, medicaments and biological substances status: Secondary | ICD-10-CM | POA: Diagnosis not present

## 2016-08-24 DIAGNOSIS — I2699 Other pulmonary embolism without acute cor pulmonale: Secondary | ICD-10-CM | POA: Diagnosis present

## 2016-08-24 DIAGNOSIS — D509 Iron deficiency anemia, unspecified: Secondary | ICD-10-CM | POA: Diagnosis present

## 2016-08-24 DIAGNOSIS — E7800 Pure hypercholesterolemia, unspecified: Secondary | ICD-10-CM | POA: Diagnosis present

## 2016-08-24 DIAGNOSIS — K219 Gastro-esophageal reflux disease without esophagitis: Secondary | ICD-10-CM | POA: Diagnosis present

## 2016-08-24 DIAGNOSIS — I613 Nontraumatic intracerebral hemorrhage in brain stem: Secondary | ICD-10-CM

## 2016-08-24 DIAGNOSIS — I482 Chronic atrial fibrillation: Secondary | ICD-10-CM

## 2016-08-24 DIAGNOSIS — F0390 Unspecified dementia without behavioral disturbance: Secondary | ICD-10-CM | POA: Diagnosis present

## 2016-08-24 DIAGNOSIS — I10 Essential (primary) hypertension: Secondary | ICD-10-CM | POA: Diagnosis not present

## 2016-08-24 DIAGNOSIS — Z8249 Family history of ischemic heart disease and other diseases of the circulatory system: Secondary | ICD-10-CM | POA: Diagnosis not present

## 2016-08-24 DIAGNOSIS — Z881 Allergy status to other antibiotic agents status: Secondary | ICD-10-CM | POA: Diagnosis not present

## 2016-08-24 DIAGNOSIS — Z8673 Personal history of transient ischemic attack (TIA), and cerebral infarction without residual deficits: Secondary | ICD-10-CM | POA: Diagnosis not present

## 2016-08-24 DIAGNOSIS — E1122 Type 2 diabetes mellitus with diabetic chronic kidney disease: Secondary | ICD-10-CM | POA: Diagnosis present

## 2016-08-24 DIAGNOSIS — Z9049 Acquired absence of other specified parts of digestive tract: Secondary | ICD-10-CM | POA: Diagnosis not present

## 2016-08-24 DIAGNOSIS — I714 Abdominal aortic aneurysm, without rupture: Secondary | ICD-10-CM | POA: Diagnosis present

## 2016-08-24 DIAGNOSIS — R079 Chest pain, unspecified: Secondary | ICD-10-CM | POA: Diagnosis present

## 2016-08-24 DIAGNOSIS — M109 Gout, unspecified: Secondary | ICD-10-CM | POA: Diagnosis present

## 2016-08-24 DIAGNOSIS — Z833 Family history of diabetes mellitus: Secondary | ICD-10-CM | POA: Diagnosis not present

## 2016-08-24 DIAGNOSIS — R0789 Other chest pain: Secondary | ICD-10-CM

## 2016-08-24 DIAGNOSIS — Z7982 Long term (current) use of aspirin: Secondary | ICD-10-CM | POA: Diagnosis not present

## 2016-08-24 DIAGNOSIS — E1151 Type 2 diabetes mellitus with diabetic peripheral angiopathy without gangrene: Secondary | ICD-10-CM | POA: Diagnosis present

## 2016-08-24 DIAGNOSIS — Z85038 Personal history of other malignant neoplasm of large intestine: Secondary | ICD-10-CM | POA: Diagnosis not present

## 2016-08-24 DIAGNOSIS — Z91041 Radiographic dye allergy status: Secondary | ICD-10-CM | POA: Diagnosis not present

## 2016-08-24 LAB — BASIC METABOLIC PANEL
Anion gap: 9 (ref 5–15)
BUN: 17 mg/dL (ref 6–20)
CALCIUM: 8.6 mg/dL — AB (ref 8.9–10.3)
CHLORIDE: 108 mmol/L (ref 101–111)
CO2: 21 mmol/L — ABNORMAL LOW (ref 22–32)
CREATININE: 1.32 mg/dL — AB (ref 0.44–1.00)
GFR calc non Af Amer: 36 mL/min — ABNORMAL LOW (ref >60)
GFR, EST AFRICAN AMERICAN: 42 mL/min — AB (ref >60)
Glucose, Bld: 125 mg/dL — ABNORMAL HIGH (ref 65–99)
Potassium: 4 mmol/L (ref 3.5–5.1)
SODIUM: 138 mmol/L (ref 135–145)

## 2016-08-24 LAB — HEPARIN LEVEL (UNFRACTIONATED)
HEPARIN UNFRACTIONATED: 0.21 [IU]/mL — AB (ref 0.30–0.70)
Heparin Unfractionated: 0.31 IU/mL (ref 0.30–0.70)

## 2016-08-24 LAB — CBC
HCT: 31.3 % — ABNORMAL LOW (ref 36.0–46.0)
HEMOGLOBIN: 10.6 g/dL — AB (ref 12.0–15.0)
MCH: 23.8 pg — AB (ref 26.0–34.0)
MCHC: 33.9 g/dL (ref 30.0–36.0)
MCV: 70.3 fL — ABNORMAL LOW (ref 78.0–100.0)
Platelets: 235 10*3/uL (ref 150–400)
RBC: 4.45 MIL/uL (ref 3.87–5.11)
RDW: 16.6 % — AB (ref 11.5–15.5)
WBC: 10.1 10*3/uL (ref 4.0–10.5)

## 2016-08-24 LAB — GLUCOSE, CAPILLARY
GLUCOSE-CAPILLARY: 123 mg/dL — AB (ref 65–99)
Glucose-Capillary: 150 mg/dL — ABNORMAL HIGH (ref 65–99)
Glucose-Capillary: 119 mg/dL — ABNORMAL HIGH (ref 65–99)
Glucose-Capillary: 131 mg/dL — ABNORMAL HIGH (ref 65–99)

## 2016-08-24 IMAGING — CR DG CHEST 2V
2 series · 2 of 2 positions shown · non-contrast
Comparison: 05/27/2012

CLINICAL DATA: Mid to lower chest pain anteriorly, history
hypertension, diabetes, colon cancer, abdominal aortic aneurysm,
CHF, GERD, gout, atrial fibrillation

EXAM:
CHEST  2 VIEW

[w chest pa]
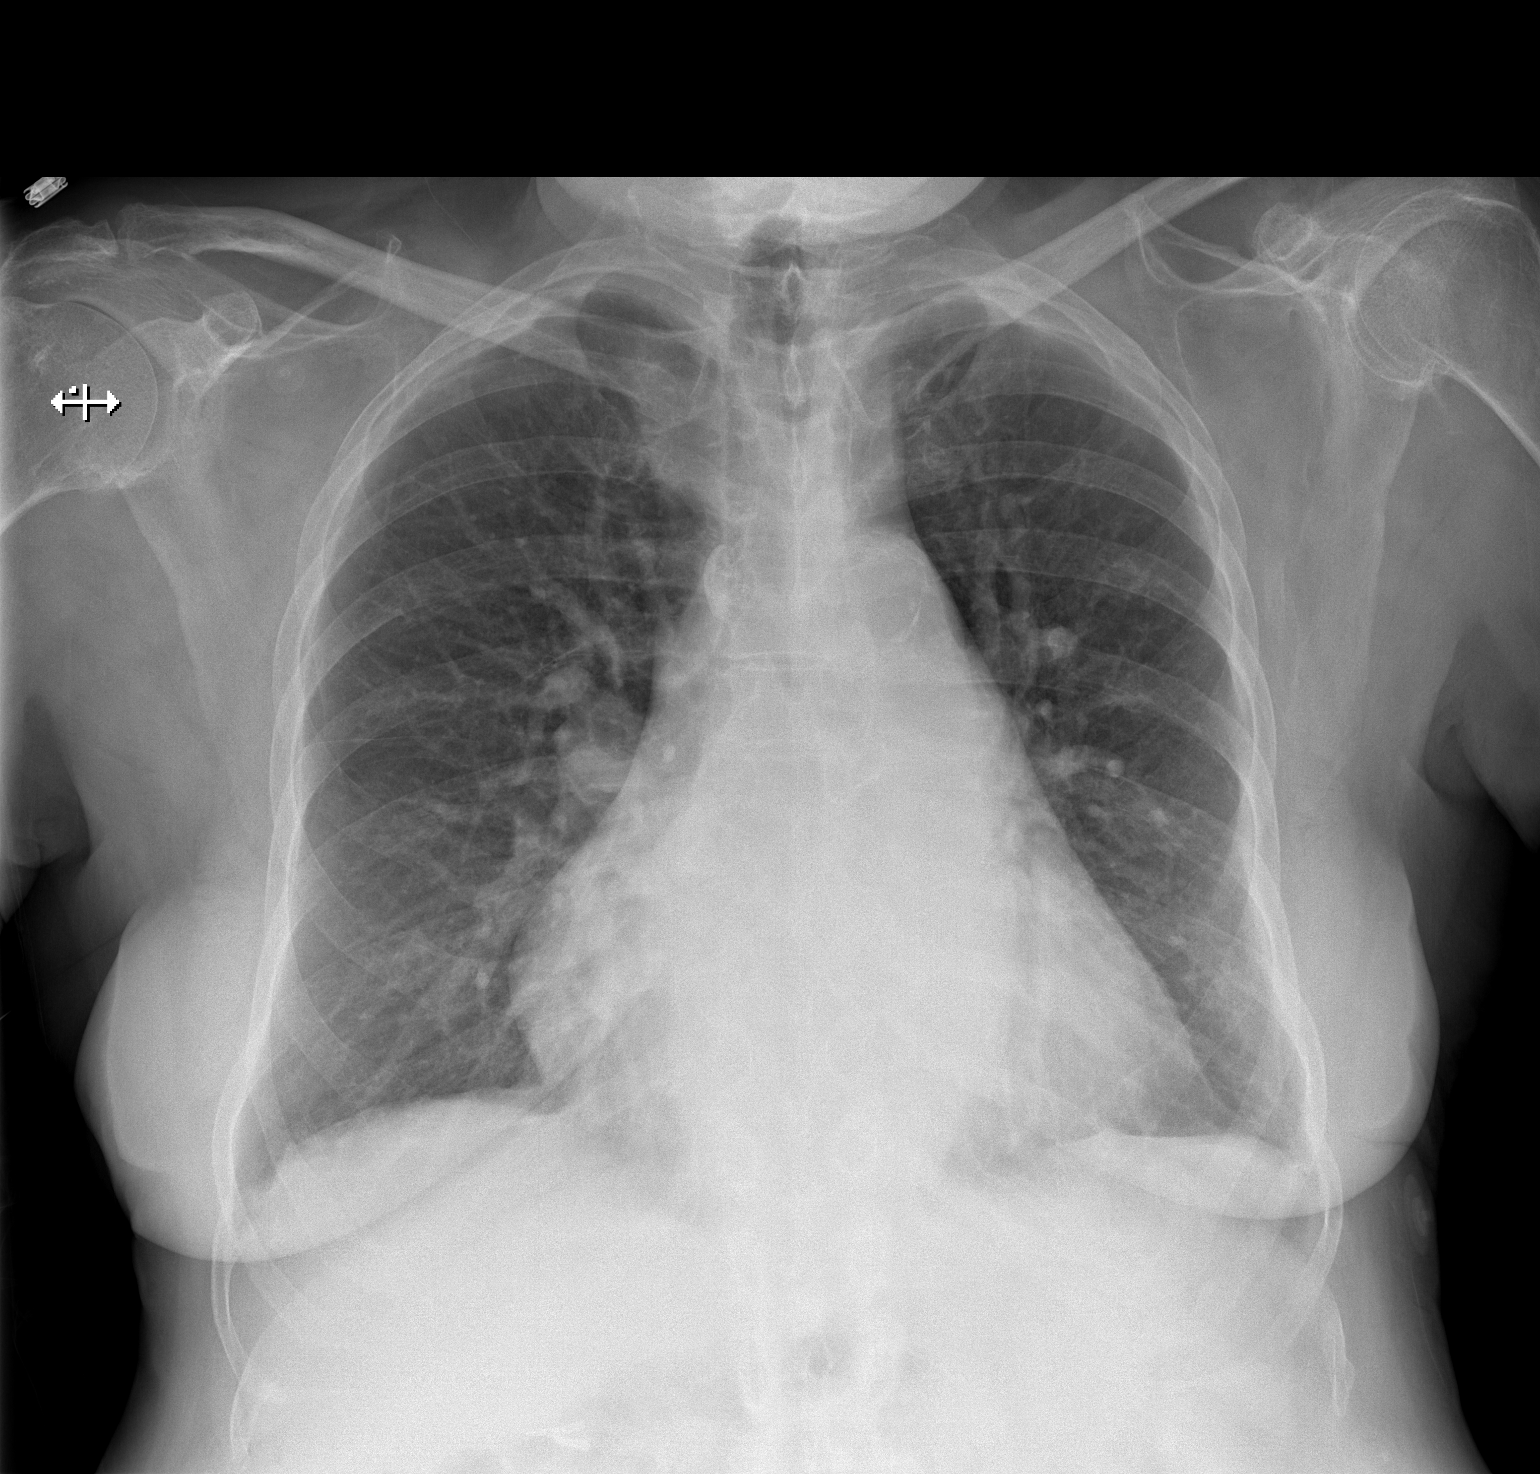

[w chest lat]
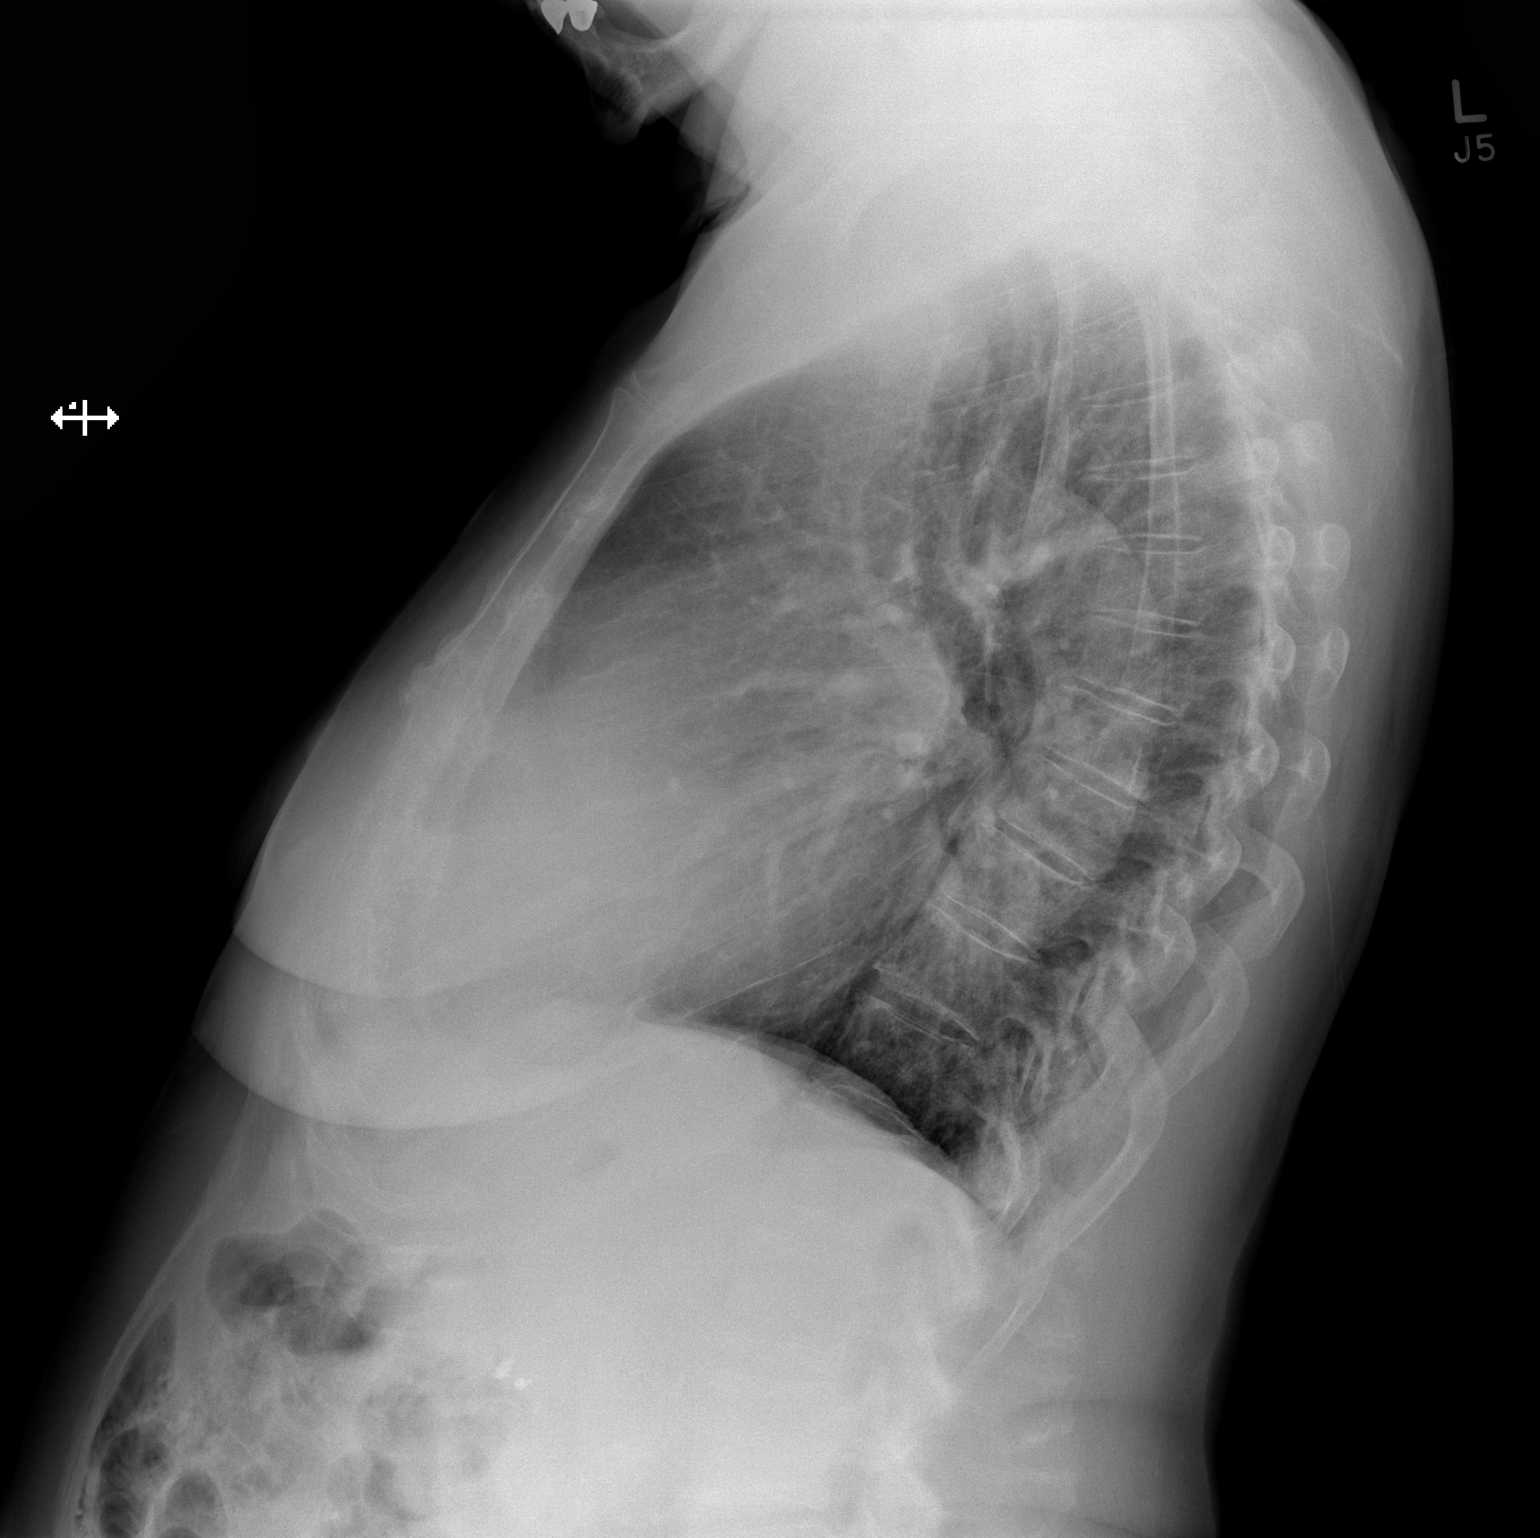

[2 of 2 positions shown; findings below may reference images not displayed]

FINDINGS: Enlargement of cardiac silhouette with pulmonary vascular
congestion.

Calcified tortuous thoracic aorta.

Lungs clear.

Improved RIGHT basilar aeration versus previous study.

No pleural effusion or pneumothorax.

Bones demineralized.
IMPRESSION: Enlargement of cardiac silhouette with pulmonary vascular
congestion.

No acute abnormalities.

## 2016-08-24 IMAGING — CT CT NECK W/O CM
3 of 6 series · 13 of 33 positions shown, 16 images · IV contrast (APPLIED)
Comparison: None.

CLINICAL DATA: Lower jaw and neck swelling on the right after fall.
Mouth injury on night stand.

EXAM:
CT NECK WITHOUT CONTRAST
TECHNIQUE: Multidetector CT imaging of the neck was performed following the
standard protocol without intravenous contrast.

[Series 6: sagittal st · sagittal · 0.46mm/px · 5 of 111 slices shown, 6 images]
[im 37/111  bone]
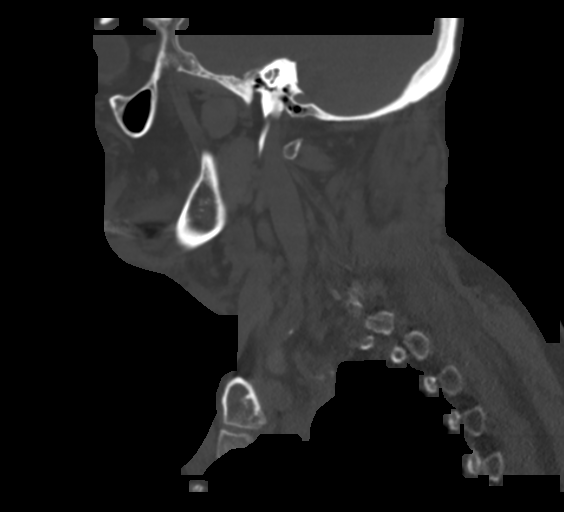
[im 46/111  bone]
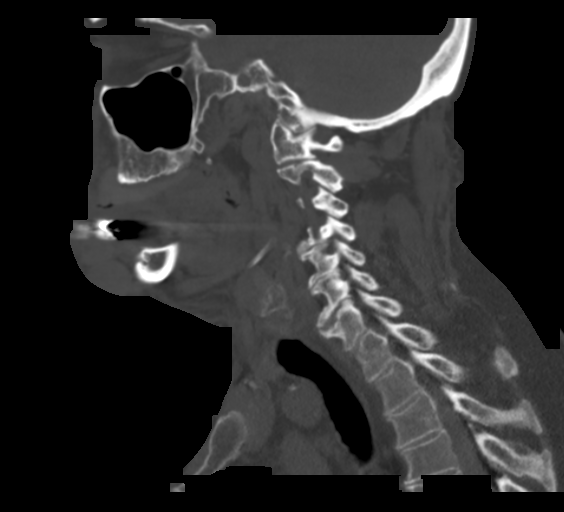
[im 56/111  soft-tissue]
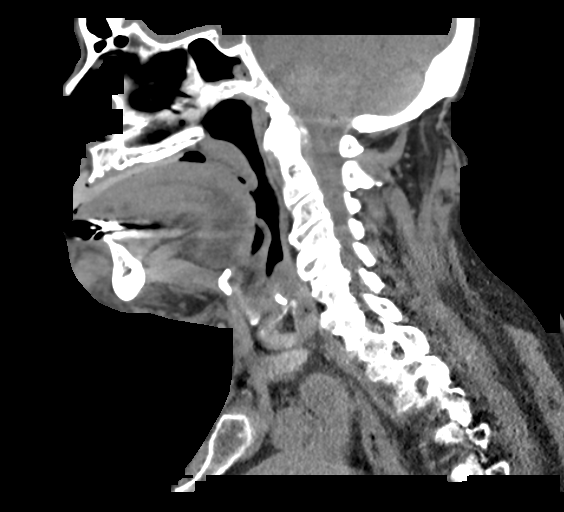
[im 56/111  bone]
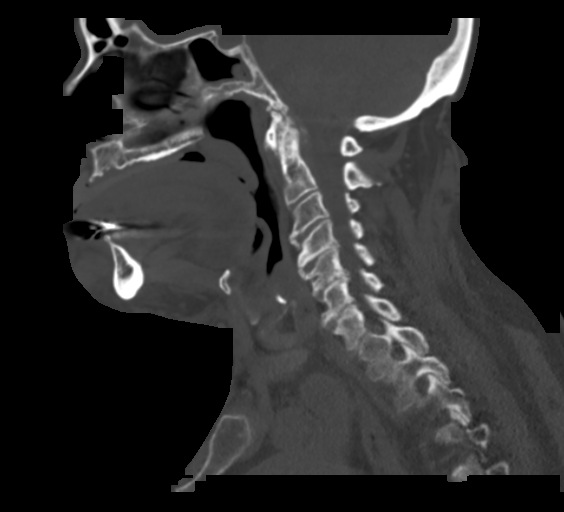
[im 65/111  bone]
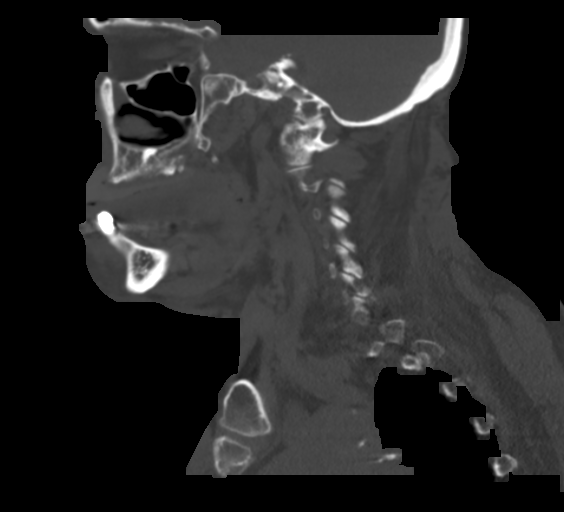
[im 74/111  bone]
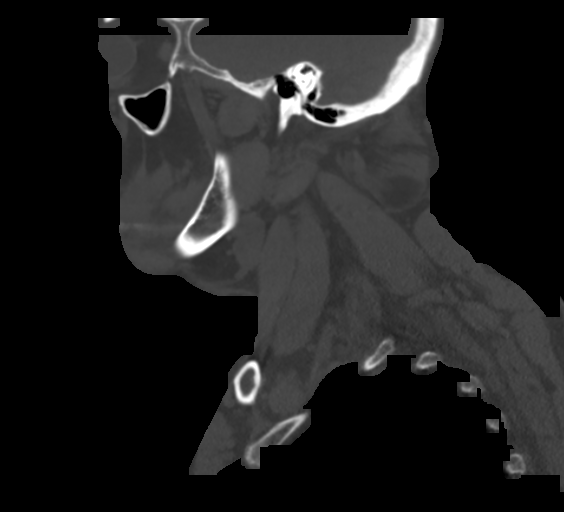

[Series 8: coronal bone · coronal · 0.46mm/px · 2 of 233 slices shown]
[im 78/233  bone]
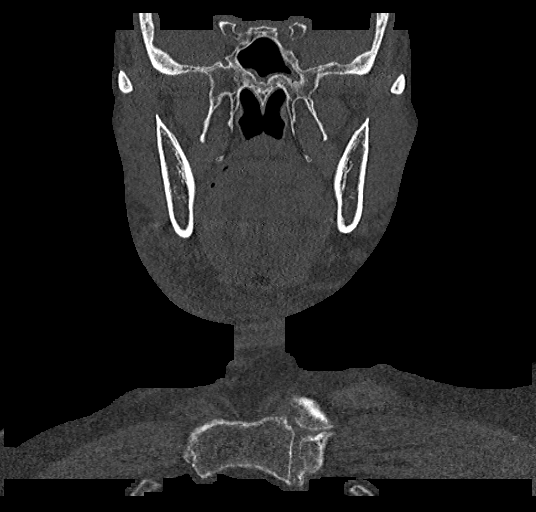
[im 155/233  bone]
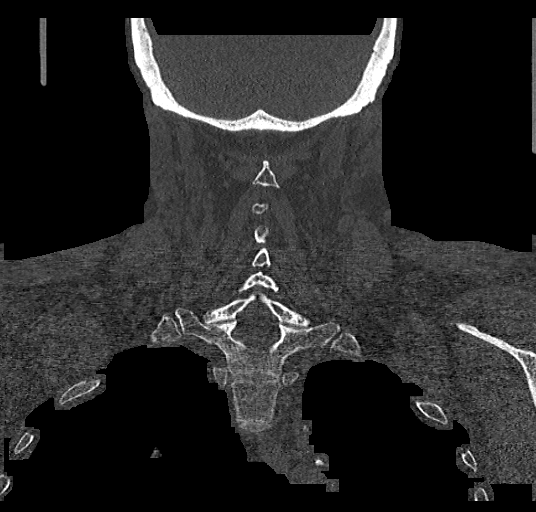

[Series 10: orthogonal bone · axial · 0.49mm/px · z∈[-307,-126]mm · 6 of 270 slices shown, 8 images]
[im 39/270  soft-tissue]
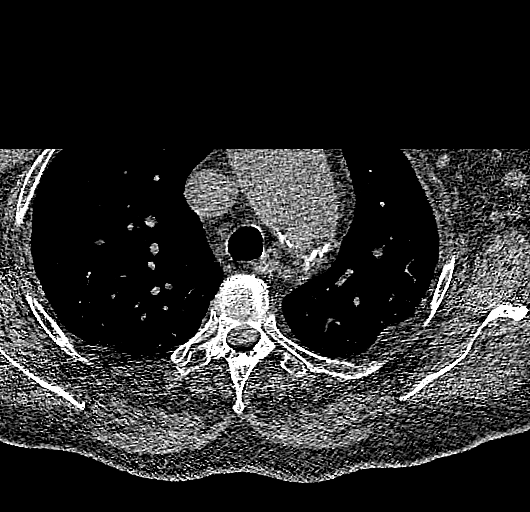
[im 39/270  bone]
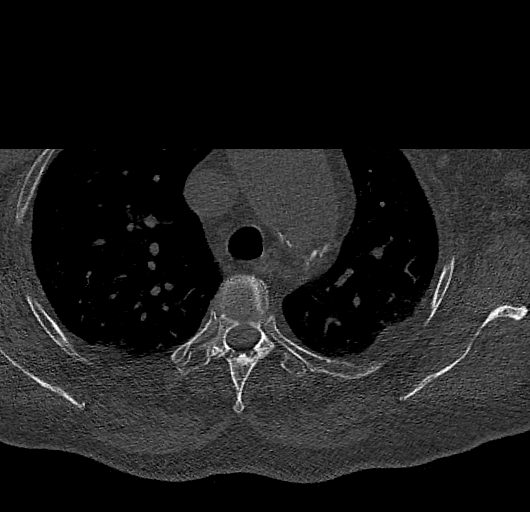
[im 77/270  bone]
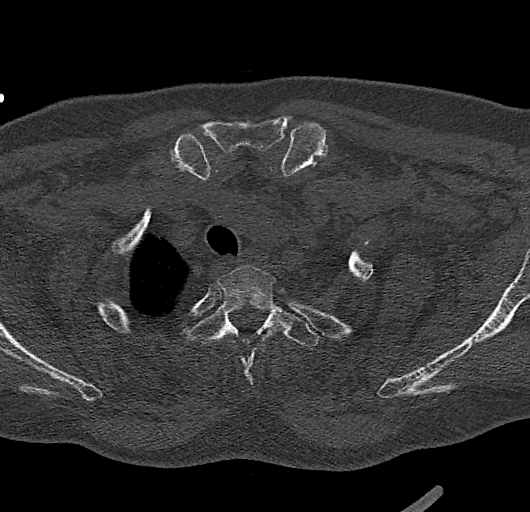
[im 116/270  bone]
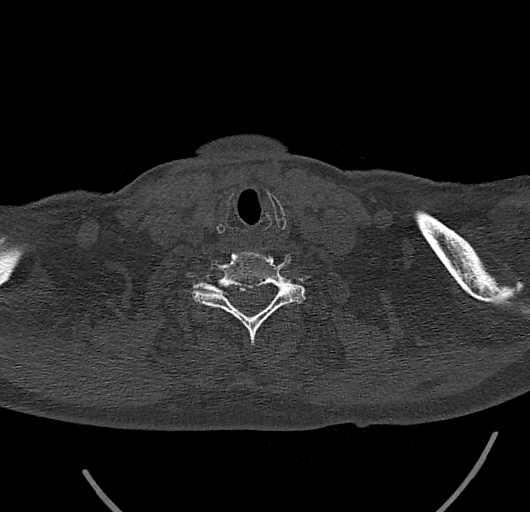
[im 154/270  bone]
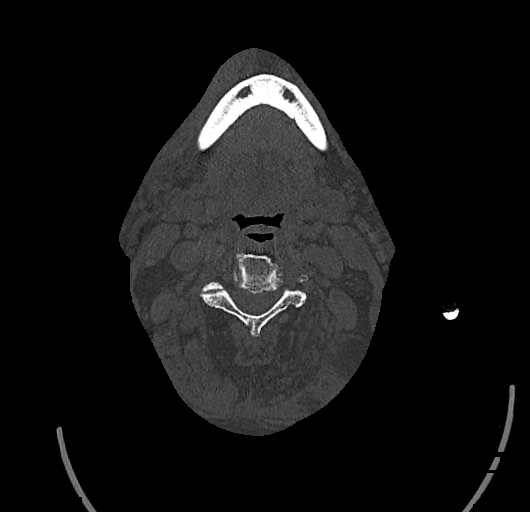
[im 193/270  soft-tissue]
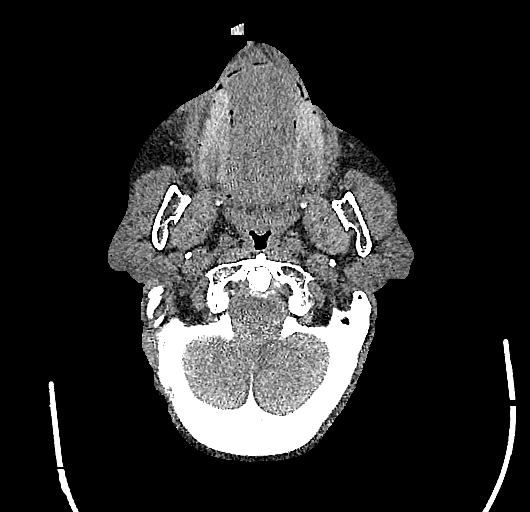
[im 193/270  bone]
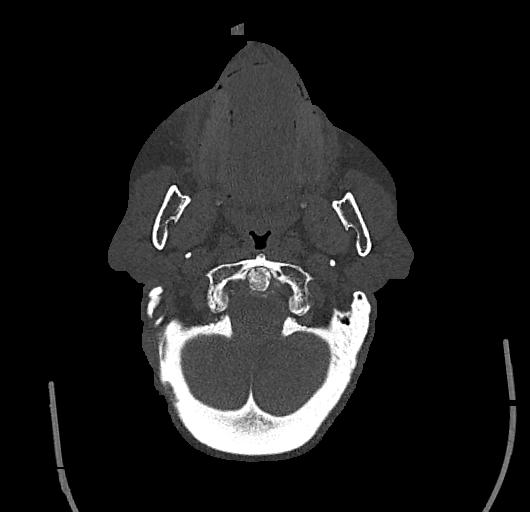
[im 231/270  bone]
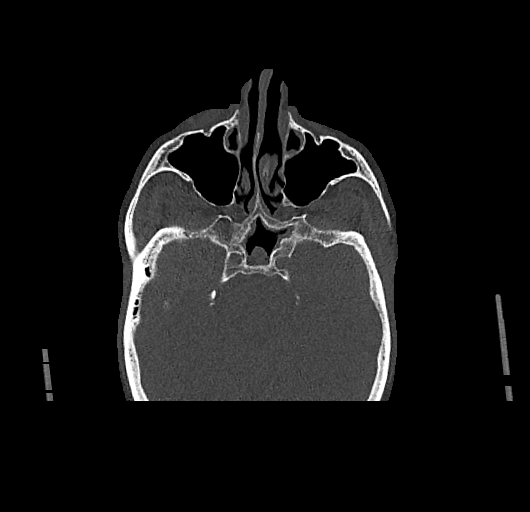

[13 of 33 positions shown; findings below may reference images not displayed]

FINDINGS: High-density layering subcutaneous mass in the right lower lip and
chin measuring up to 3.5 cm in maximal dimension. The appearance and
history is consistent with hematoma, with traumatic edema in the
surrounding face and neck. There is no evidence of acute facial
fracture or internal foreign body.

Remote appearing superior endplate fracture of T3 with minimal
height loss. Diffuse cervical spondylosis with spurring and disc
narrowing. No prevertebral swelling.

No traumatic findings in the upper chest. There is a partially
visualized proximal aortic aneurysm without evidence of enlargement
since 3656 chest CT when the ascending aorta measured 48 mm
diameter.

No evidence of mass along the surfaces of the aerodigestive tract.
The salivary glands are unremarkable. No deep space swelling or mass
effect.

Bilateral cataract resection.  No traumatic findings in the orbits.

Limited intracranial imaging negative for traumatic findings.

Chronic left sphenoid sinusitis with wall thickening and partial
opacification.
IMPRESSION: Subcutaneous hematoma in the right chin without fracture.

## 2016-08-24 NOTE — Progress Notes (Signed)
PROGRESS NOTE    Nicole [name]  [id number] DOB: 1930/08/22 DOA: 08/22/2016 PCP: Hollace Kinnier, DO   Brief Narrative:  83 yo female with history of intracerebral hemorrhage on 2016, presents with chest pain, 8/10 in intensity, pleuritic in nature. On initial examination patient hemodynamically stable, noted D dimer elevated, CT chest positive for PE. Patient started on IV heparin and consulted neurology in regards for anticoagulation, follow on echocardiogram.    Assessment & Plan:   Principal Problem:   Chest pain Active Problems:   HTN (hypertension)   Hypothyroidism   ICH (intracerebral hemorrhage) (H[facility])   Chronic atrial fibrillation ([facility])   Type 2 diabetes mellitus with peripheral neuropathy ([place])   Positive D dimer   Pulmonary embolus ([place])   1. Acute pulmonary embolism. Will continue anticoagulation with heparin drip, blood pressure has been stable, follow on echocardiogram, case discussed with neurology. Will plan to start patient on apixaban for anticoagulation.   2. Atrial fibrillation. Will continue rate control metoprolol.   3. Diastolic heart failure. Will target negative fluid balance, will continue blood pressure with hydralazine, clonidien and isosorbide.   4. Hypothyroid. Will continue levothyroxine.   5. Dementia. Continue on donepezil  6. T2DM. Will continue insulin sliding scale for glucose cover and monitoring, capillary glucose 123, 150, 119.   7. CKD stage 3. Stable cr at 1,3 will continue supportive care, avoid hypotension or nephrotoxic medications.   8. HTN. Will continue blood pressure control with hydralazine, clonidine, and isosorbide.    DVT prophylaxis: IV heparin  Code Status: DNR  Family Communication: I spoke with patient's husband and son (over the phone) at the bedside and all questions were addressed.  Disposition Plan: Home    Consultants:   Neurology   Procedures:    Antimicrobials:      Subjective: Patient  complains of dyspnea and pleuritic chest pain, moderate in intensity, worse with deep breathing, improved with rest, associated with mild dyspnea.   Objective: Vitals:   08/23/16 1704 08/23/16 2137 08/24/16 0010 08/24/16 0635  BP: 136/82 139/73 135/65 (!) 166/87  Pulse: 69 86 75 76  Resp: (!) 21 (!) 24 17 (!) 26  Temp: 98.3 F (36.8 C) 99.9 F (37.7 C) 98.6 F (37 C) 97.5 F (36.4 C)  TempSrc: Oral Oral Oral Oral  SpO2: 100% 92% 95% 97%  Weight:    71.2 kg (157 lb)  Height:        Intake/Output Summary (Last 24 hours) at 08/24/16 0818 Last data filed at 08/24/16 0600  Gross per 24 hour  Intake          1370.58 ml  Output              650 ml  Net           720.58 ml   Filed Weights   08/22/16 1501 08/22/16 2305 08/24/16 0635  Weight: 74.4 kg (164 lb) 70.5 kg (155 lb 8 oz) 71.2 kg (157 lb)    Examination:  General exam: Deconditioned and ill looking appearing E ENT: mild pallor but no icterus, oral mucosa moist Respiratory system: Clear to auscultation. Respiratory effort normal. No wheezing, rales or rhonchi.  Cardiovascular system: S1 & S2 heard, RRR. No JVD, murmurs, rubs, gallops or clicks. No pedal edema. Gastrointestinal system: Abdomen is nondistended, soft and nontender. No organomegaly or masses felt. Normal bowel sounds heard. Central nervous system: Alert and oriented. No focal neurological deficits. Extremities: Symmetric 5 x 5 power. Skin: No rashes, lesions  or ulcers   Data Reviewed: I have personally reviewed following labs and imaging studies  CBC:  Recent Labs Lab 08/22/16 1505 08/23/16 0347 08/24/16 0554  WBC 6.6 12.1* 10.1  NEUTROABS  --  9.7*  --   HGB 11.6* 10.6* 10.6*  HCT 34.3* 31.6* 31.3*  MCV 70.6* 70.2* 70.3*  PLT 287 267 [id number]   Basic Metabolic Panel:  Recent Labs Lab 08/22/16 1505 08/23/16 0347 08/24/16 0554  NA 139 139 138  K 4.2 4.2 4.0  CL 108 108 108  CO2 24 23 21*  GLUCOSE 200* 163* 125*  BUN 19 18 17   CREATININE  1.38* 1.24* 1.32*  CALCIUM 9.1 8.7* 8.6*   GFR: Estimated Creatinine Clearance: 32.6 mL/min (by C-G formula based on SCr of 1.32 mg/dL (H)). Liver Function Tests: No results for input(s): AST, ALT, ALKPHOS, BILITOT, PROT, ALBUMIN in the last 168 hours. No results for input(s): LIPASE, AMYLASE in the last 168 hours. No results for input(s): AMMONIA in the last 168 hours. Coagulation Profile:  Recent Labs Lab 08/22/16 1724  INR 1.15   Cardiac Enzymes:  Recent Labs Lab 08/23/16 0347  TROPONINI <0.03   BNP (last 3 results) No results for input(s): PROBNP in the last 8760 hours. HbA1C: No results for input(s): HGBA1C in the last 72 hours. CBG:  Recent Labs Lab 08/23/16 0842 08/23/16 1256 08/23/16 1702 08/23/16 2134 08/24/16 0738  GLUCAP 149* 188* 170* 140* 123*   Lipid Profile: No results for input(s): CHOL, HDL, LDLCALC, TRIG, CHOLHDL, LDLDIRECT in the last 72 hours. Thyroid Function Tests: No results for input(s): TSH, T4TOTAL, FREET4, T3FREE, THYROIDAB in the last 72 hours. Anemia Panel: No results for input(s): VITAMINB12, FOLATE, FERRITIN, TIBC, IRON, RETICCTPCT in the last 72 hours. Sepsis Labs:  Recent Labs Lab 08/22/16 1746  LATICACIDVEN 1.67    No results found for this or any previous visit (from the past 240 hour(s)).       Radiology Studies: Dg Chest 2 View  Result Date: 08/22/2016 CLINICAL DATA:  Chest pain, shortness of breath which began this morning. History of CHF, hypertension, pulmonary hypertension, colonic malignancy, peripheral vascular disease, and diabetes. EXAM: CHEST  2 VIEW COMPARISON:  PA and lateral chest x-ray of May 01, 2016 FINDINGS: The lungs are mildly hyperinflated with hemidiaphragm flattening. There is no focal infiltrate. The cardiac silhouette is enlarged. The central pulmonary vascularity is mildly prominent. No pulmonary interstitial edema is observed. There is a small right pleural effusion. There is calcification  in the wall of the aortic arch. The bony thorax exhibits no acute abnormality. IMPRESSION: COPD. Cardiomegaly with out definite pulmonary edema. Small right pleural effusion No focal pneumonia. Thoracic aortic atherosclerosis. Electronically Signed   By: David  Martinique M.D.   On: 08/22/2016 15:34   Ct Angio Chest Pe W Or Wo Contrast  Result Date: 08/23/2016 CLINICAL DATA:  Acute chest pain. Substernal chest pain. Worsening short of breath with exertion. Positive D-dimer. EXAM: CT ANGIOGRAPHY CHEST WITH CONTRAST TECHNIQUE: Multidetector CT imaging of the chest was performed using the standard protocol during bolus administration of intravenous contrast. Multiplanar CT image reconstructions and MIPs were obtained to evaluate the vascular anatomy. CONTRAST:  90 cc Isovue 370 COMPARISON:  None. FINDINGS: Cardiovascular: No filling defects within the main pulmonary arteries. Within the posterior RIGHT lower lobe pulmonary artery there an eccentric filling defect (image 157, series 6). There is mild expansion of the artery compare to adjacent vessels (image 156, series 6). Similarly, in the posterior segment of  the LEFT lower lobe pulmonary artery, there is eccentric filling defect (image 164, series 6) with expansion of the vessel. Small filling defects more distally the RIGHT LEFT lower lobe pulmonary artery on image 185 series 6. No upper lobe filling defects identified. The ascending thoracic aorta measures 46 mm. Mediastinum/Nodes: No axillary supraclavicular adenopathy. No pericardial fluid. Esophagus normal. Lungs/Pleura: Bilateral small effusions, RIGHT greater than LEFT. Mild bibasilar atelectasis. No pulmonary edema. No pneumonia. Upper Abdomen: Limited view of the liver, kidneys, pancreas are unremarkable. Normal adrenal glands. Musculoskeletal: No aggressive osseous lesion. Review of the MIP images confirms the above findings. IMPRESSION: 1. Eccentric filling defect within the posterior lower lobe pulmonary  artery on the LEFT and RIGHT are most consistent with pulmonary emboli. The findings have some features of subacute pulmonary emboli. 2. Overall clot burden is minimal.  No RIGHT ventricular strain. 3. Bilateral small effusions, RIGHT greater LEFT. 4. Ascending thoracic aorta measures 46 mm. Ascending thoracic aortic aneurysm. Recommend semi-annual imaging followup by CTA or MRA and referral to cardiothoracic surgery if not already obtained. This recommendation follows 2010 ACCF/AHA/AATS/ACR/ASA/SCA/SCAI/SIR/STS/SVM Guidelines for the Diagnosis and Management of Patients With Thoracic Aortic Disease. Circulation. 2010; [id number] Electronically Signed   By: Suzy Bouchard M.D.   On: 08/23/2016 12:58   Nm Pulmonary Perf And Vent  Result Date: 08/22/2016 CLINICAL DATA:  83 y/o F; chest pain and shortness of breath. Positive D-dimer. EXAM: NUCLEAR MEDICINE VENTILATION - PERFUSION LUNG SCAN TECHNIQUE: Ventilation images were obtained in multiple projections using inhaled aerosol Tc-15m DTPA. Perfusion images were obtained in multiple projections after intravenous injection of Tc-7m MAA. RADIOPHARMACEUTICALS:  31 mCi Technetium-81m DTPA aerosol inhalation and 3.9 mCi Technetium-52m MAA IV COMPARISON:  08/22/2016 chest radiograph. FINDINGS: Ventilation: Diffusely heterogeneous ventilation probably representing airways disease. Perfusion: Large wedge-shaped mismatched ventilation perfusion defect within the right lower lobe. No radiographic correlate. IMPRESSION: 1. Single large V/Q mismatch in the right lower lobe. Intermediate probability of pulmonary embolus. 2. Diffuse heterogeneity of ventilation tracer uptake likely represents airways disease. Electronically Signed   By: Kristine Garbe M.D.   On: 08/22/2016 22:17        Scheduled Meds: . allopurinol  100 mg Oral Daily  . aspirin EC  81 mg Oral Daily  . cloNIDine  0.2 mg Oral TID  . donepezil  10 mg Oral QHS  . hydrALAZINE  50 mg Oral  TID  . insulin aspart  0-9 Units Subcutaneous TID WC  . isosorbide mononitrate  30 mg Oral QHS  . levothyroxine  50 mcg Oral QAC breakfast  . metoprolol  100 mg Oral BID  . potassium chloride  10 mEq Oral QODAY   Continuous Infusions: . heparin 1,300 Units/hr (08/24/16 0713)  . sodium chloride 0.9 % 1,000 mL infusion 50 mL/hr at 08/23/16 1355     LOS: 0 days      Tawni Millers, MD [facility] Pager [phone]  If 7PM-7AM, please contact night-coverage [email or web address] Password [facility] 08/24/2016, 8:18 AM

## 2016-08-24 NOTE — Care Management Note (Addendum)
Case Management Note  Patient Details  Name: Nicole [name] MRN: [id number] Date of Birth: 08-Aug-1930  Subjective/Objective:   Pt presented for chest pain-Pt with elevated d-dimer and V/Q scan with intermediate probability of PE in the right lower lobe. Initiated on IV Heparin Gtt. Pt is from home with husband- no dme. PTA pt was independent. Plan will be to return home once stable. Benefits check in process for Eliquis.                Action/Plan: No needs identified at this time from CM.   Expected Discharge Date:                  Expected Discharge Plan:  Home/Self Care  In-House Referral:  NA  Discharge planning Services  CM Consult  Post Acute Care Choice:  NA Choice offered to:  NA  DME Arranged:  N/A DME Agency:  NA  HH Arranged:  NA HH Agency:  NA  Status of Service:  Completed, signed off  If discussed at [place] of Stay Meetings, dates discussed:    Additional Comments: 1457 08-24-16 Jacqlyn Krauss, RN,BSN [phone] Benefits check completed for Eliquis: Pt continues on IV Heparin Gtt. No 30 day free card given a this time.  S/W [place]ASSAN[place] @ [employer] [facility] # [phone]   ELIQUIS  2.5 MG BID   COVER- YES  CO-PAY- $ 45.00  PRIOR APPROVAL- NO   ELIQUIS  5 MG BID   COVER- YES  CO-PAY- $ 45.00  PRIOR APPROVAL- NO   PHARMACY : [facility] Bethena Roys, RN 08/24/2016, 10:18 AM

## 2016-08-24 NOTE — Care Management Obs Status (Signed)
[place] NOTIFICATION   Patient Details  Name: Nicole [name] MRN: [id number] Date of Birth: 12-27-1930   Medicare Observation Status Notification Given:  Yes    Bethena Roys, RN 08/24/2016, 10:17 AM

## 2016-08-24 NOTE — Progress Notes (Signed)
[place] for heparin Indication: pulmonary embolus  Allergies  Allergen Reactions  . Iohexol Nausea And Vomiting  . Iodinated Diagnostic Agents Nausea And Vomiting  . Z-Pak [Azithromycin] Other (See Comments)    Unknown allergic reaction    Patient Measurements: Height: 5\' 8"  (172.7 cm) Weight: 157 lb (71.2 kg) IBW/kg (Calculated) : 63.9  Vital Signs: Temp: 97.5 F (36.4 C) (02/02 0635) Temp Source: Oral (02/02 0635) BP: 166/87 (02/02 0635) Pulse Rate: 76 (02/02 0635)  Labs:  Recent Labs  08/22/16 1505 08/22/16 1724 08/23/16 0347 08/23/16 0747 08/23/16 1646 08/24/16 0554  HGB 11.6*  --  10.6*  --   --  10.6*  HCT 34.3*  --  31.6*  --   --  31.3*  PLT 287  --  267  --   --  235  LABPROT  --  14.8  --   --   --   --   INR  --  1.15  --   --   --   --   HEPARINUNFRC  --   --   --  0.24* 0.34 0.21*  CREATININE 1.38*  --  1.24*  --   --  1.32*  TROPONINI  --   --  <0.03  --   --   --     Estimated Creatinine Clearance: 32.6 mL/min (by C-G formula based on SCr of 1.32 mg/dL (H)).  Assessment: 83 y.o. female with PE for heparin  Goal of Therapy:  Heparin level 0.3-0.7 units/ml Monitor platelets by anticoagulation protocol: Yes   Plan:  Increase Heparin 1300 units/hr Check heparin level in 8 hours.   Phillis Knack, PharmD, BCPS  08/24/2016 7:07 AM

## 2016-08-24 NOTE — Progress Notes (Signed)
[place] for heparin Indication: pulmonary embolus  Allergies  Allergen Reactions  . Iohexol Nausea And Vomiting  . Iodinated Diagnostic Agents Nausea And Vomiting  . Z-Pak [Azithromycin] Other (See Comments)    Unknown allergic reaction    Patient Measurements: Height: 5\' 8"  (172.7 cm) Weight: 157 lb (71.2 kg) IBW/kg (Calculated) : 63.9  Vital Signs: Temp: 98.5 F (36.9 C) (02/02 1400) Temp Source: Oral (02/02 1400) BP: 148/90 (02/02 1400) Pulse Rate: 60 (02/02 1400)  Labs:  Recent Labs  08/22/16 1505 08/22/16 1724 08/23/16 0347  08/23/16 1646 08/24/16 0554 08/24/16 1501  HGB 11.6*  --  10.6*  --   --  10.6*  --   HCT 34.3*  --  31.6*  --   --  31.3*  --   PLT 287  --  267  --   --  235  --   LABPROT  --  14.8  --   --   --   --   --   INR  --  1.15  --   --   --   --   --   HEPARINUNFRC  --   --   --   < > 0.34 0.21* 0.31  CREATININE 1.38*  --  1.24*  --   --  1.32*  --   TROPONINI  --   --  <0.03  --   --   --   --   < > = values in this interval not displayed.  Estimated Creatinine Clearance: 32.6 mL/min (by C-G formula based on SCr of 1.32 mg/dL (H)).  Assessment: 83 y.o. female with PE for heparin.  Heparin level therapeutic after most recent rate adjustment to 1300 units/hr. CBC stable.   Goal of Therapy:  Heparin level 0.3-0.7 units/ml Monitor platelets by anticoagulation protocol: Yes   Plan:  1. Continue heparin infusion at 1300 units/hr 2. Confirmation heparin level in am  3. Daily heparin level and CBC 4. Follow up long term a/c plans   Vincenza Hews, PharmD, BCPS 08/24/2016, 4:42 PM

## 2016-08-25 DIAGNOSIS — I10 Essential (primary) hypertension: Secondary | ICD-10-CM

## 2016-08-25 LAB — CBC
HEMATOCRIT: 29.4 % — AB (ref 36.0–46.0)
HEMOGLOBIN: 10 g/dL — AB (ref 12.0–15.0)
MCH: 23.8 pg — ABNORMAL LOW (ref 26.0–34.0)
MCHC: 34 g/dL (ref 30.0–36.0)
MCV: 69.8 fL — AB (ref 78.0–100.0)
Platelets: 213 10*3/uL (ref 150–400)
RBC: 4.21 MIL/uL (ref 3.87–5.11)
RDW: 17 % — ABNORMAL HIGH (ref 11.5–15.5)
WBC: 7.6 10*3/uL (ref 4.0–10.5)

## 2016-08-25 LAB — GLUCOSE, CAPILLARY
GLUCOSE-CAPILLARY: 162 mg/dL — AB (ref 65–99)
GLUCOSE-CAPILLARY: 105 mg/dL — AB (ref 65–99)
Glucose-Capillary: 135 mg/dL — ABNORMAL HIGH (ref 65–99)
Glucose-Capillary: 162 mg/dL — ABNORMAL HIGH (ref 65–99)

## 2016-08-25 LAB — BASIC METABOLIC PANEL
Anion gap: 6 (ref 5–15)
BUN: 12 mg/dL (ref 6–20)
CALCIUM: 8.5 mg/dL — AB (ref 8.9–10.3)
CHLORIDE: 108 mmol/L (ref 101–111)
CO2: 23 mmol/L (ref 22–32)
Creatinine, Ser: 1.26 mg/dL — ABNORMAL HIGH (ref 0.44–1.00)
GFR calc non Af Amer: 38 mL/min — ABNORMAL LOW (ref >60)
GFR, EST AFRICAN AMERICAN: 44 mL/min — AB (ref >60)
GLUCOSE: 127 mg/dL — AB (ref 65–99)
Potassium: 4 mmol/L (ref 3.5–5.1)
Sodium: 137 mmol/L (ref 135–145)

## 2016-08-25 LAB — HEPARIN LEVEL (UNFRACTIONATED): HEPARIN UNFRACTIONATED: 0.26 [IU]/mL — AB (ref 0.30–0.70)

## 2016-08-25 MED ORDER — APIXABAN 5 MG PO TABS
10.0000 mg | ORAL_TABLET | Freq: Two times a day (BID) | ORAL | Status: DC
  Administered 2016-08-25 – 2016-08-26 (×3): 10 mg via ORAL
  Filled 2016-08-25 (×3): qty 2

## 2016-08-25 MED ORDER — APIXABAN 5 MG PO TABS: 5.0000 mg | ORAL_TABLET | Freq: Two times a day (BID) | ORAL | Status: DC

## 2016-08-25 MED ORDER — LISINOPRIL 5 MG PO TABS
5.0000 mg | ORAL_TABLET | Freq: Every day | ORAL | Status: DC
  Administered 2016-08-25 – 2016-08-26 (×2): 5 mg via ORAL
  Filled 2016-08-25 (×2): qty 1

## 2016-08-25 NOTE — Evaluation (Signed)
Physical Therapy Evaluation Patient Details Name: Nicole [name] MRN: [id number] DOB: Sep 14, 1930 Today's Date: 08/25/2016   History of Present Illness  pt presents with PE.  pt with hx of CVA, CHF, Dementia, CKD, A-fib, DM, HTN, AAA, Anxiety, Depression, and Colon CA.    Clinical Impression  Pt seems to be not far from her baseline per significant other report.  Pt is cautious with mobility and indicates she feels a little nervous since she has been in bed for a few days.  Pt will have support from her family at home and encouraged pt to use her RW for mobility until she feels back to normal.  Will continue to follow.      Follow Up Recommendations Home health PT;Supervision/Assistance - 24 hour    Equipment Recommendations  None recommended by PT    Recommendations for Other Services       Precautions / Restrictions Precautions Precautions: Fall Restrictions Weight Bearing Restrictions: No      Mobility  Bed Mobility Overal bed mobility: Modified Independent                Transfers Overall transfer level: Needs assistance Equipment used: 1 person hand held assist Transfers: Sit to/from Stand Sit to Stand: Min guard         General transfer comment: pt seems cautious and reachs for PT's hand upon coming to standing.  No A needed, just guarding for safety.    Ambulation/Gait Ambulation/Gait assistance: Min guard Ambulation Distance (Feet): 100 Feet Assistive device: 1 person hand held assist Gait Pattern/deviations: Step-through pattern;Decreased stride length     General Gait Details: pt does hold PT's hand and indicates she is a little nervous since she has been in bed for a few days.  No physical A needed.  pt is SOB with activity, but O2 sats remained in 90s throughout.    Stairs            Wheelchair Mobility    Modified Rankin (Stroke Patients Only)       Balance Overall balance assessment: Needs assistance Sitting-balance support: No  upper extremity supported;Feet supported Sitting balance-Leahy Scale: Good     Standing balance support: Single extremity supported;No upper extremity supported;During functional activity Standing balance-Leahy Scale: Fair                               Pertinent Vitals/Pain Pain Assessment: No/denies pain    Home Living Family/patient expects to be discharged to:: Private residence Living Arrangements: Spouse/significant other;Children Available Help at Discharge: Family;Available PRN/intermittently Type of Home: House Home Access: Stairs to enter Entrance Stairs-Rails: Right Entrance Stairs-Number of Steps: 4 Home Layout: One level Home Equipment: Walker - 2 wheels;Cane - single point;Shower seat      Prior Function Level of Independence: Independent               Hand Dominance        Extremity/Trunk Assessment   Upper Extremity Assessment Upper Extremity Assessment: Generalized weakness    Lower Extremity Assessment Lower Extremity Assessment: Generalized weakness    Cervical / Trunk Assessment Cervical / Trunk Assessment: Normal  Communication   Communication: No difficulties  Cognition Arousal/Alertness: Awake/alert Behavior During Therapy: WFL for tasks assessed/performed Overall Cognitive Status: History of cognitive impairments - at baseline                 General Comments: pt forgetful, but per significant other pt  is at baseline.    General Comments      Exercises     Assessment/Plan    PT Assessment Patient needs continued PT services  PT Problem List Decreased strength;Decreased activity tolerance;Decreased balance;Decreased mobility;Decreased cognition;Decreased knowledge of use of DME;Cardiopulmonary status limiting activity          PT Treatment Interventions DME instruction;Gait training;Stair training;Functional mobility training;Therapeutic activities;Therapeutic exercise;Balance training;Patient/family  education    PT Goals (Current goals can be found in the Care Plan section)  Acute Rehab PT Goals Patient Stated Goal: Per pt to go home PT Goal Formulation: With patient Time For Goal Achievement: 09/01/16 Potential to Achieve Goals: Good    Frequency Min 3X/week   Barriers to discharge        Co-evaluation               End of Session Equipment Utilized During Treatment: Gait belt Activity Tolerance: Patient limited by fatigue Patient left: in chair;with call bell/phone within reach;with chair alarm set;with family/visitor present Nurse Communication: Mobility status         Time: [id number] PT Time Calculation (min) (ACUTE ONLY): 28 min   Charges:   PT Evaluation $PT Eval Moderate Complexity: 1 Procedure PT Treatments $Gait Training: 8-22 mins   PT G CodesCatarina Hartshorn, PT  [phone] 08/25/2016, 9:53 AM

## 2016-08-25 NOTE — Discharge Instructions (Addendum)
Information on my medicine - ELIQUIS (apixaban)  This medication education was reviewed with me or my healthcare representative as part of my discharge preparation.  The pharmacist that spoke with me during my hospital stay was:  Arty Baumgartner, [facility]  Why was Eliquis prescribed for you? Eliquis was prescribed to treat blood clots that may have been found in the veins of your legs (deep vein thrombosis) or in your lungs (pulmonary embolism) and to reduce the risk of them occurring again.  What do You need to know about Eliquis ? The starting dose is 10 mg (two 5 mg tablets) taken TWICE daily for the FIRST SEVEN (7) DAYS, then on  09/01/16  the dose is reduced to ONE 5 mg tablet taken TWICE daily.  Eliquis may be taken with or without food.   Try to take the dose about the same time in the morning and in the evening. If you have difficulty swallowing the tablet whole please discuss with your pharmacist how to take the medication safely.  Take Eliquis exactly as prescribed and DO NOT stop taking Eliquis without talking to the doctor who prescribed the medication.  Stopping may increase your risk of developing a new blood clot.  Refill your prescription before you run out.  After discharge, you should have regular check-up appointments with your healthcare provider that is prescribing your Eliquis.    What do you do if you miss a dose? If a dose of ELIQUIS is not taken at the scheduled time, take it as soon as possible on the same day and twice-daily administration should be resumed. The dose should not be doubled to make up for a missed dose.  Important Safety Information A possible side effect of Eliquis is bleeding. You should call your healthcare provider right away if you experience any of the following: ? Bleeding from an injury or your nose that does not stop. ? Unusual colored urine (red or dark brown) or unusual colored stools (red or black). ? Unusual bruising for unknown  reasons. ? A serious fall or if you hit your head (even if there is no bleeding).  Some medicines may interact with Eliquis and might increase your risk of bleeding or clotting while on Eliquis. To help avoid this, consult your healthcare provider or pharmacist prior to using any new prescription or non-prescription medications, including herbals, vitamins, non-steroidal anti-inflammatory drugs (NSAIDs) and supplements.  This website has more information on Eliquis (apixaban): http://www.eliquis.com/eliquis/home    Pulmonary Embolism A pulmonary embolism (PE) is a sudden blockage or decrease of blood flow in one lung or both lungs. Most blockages come from a blood clot that travels from the legs or the pelvis to the lungs. PE is a dangerous and potentially life-threatening condition if it is not treated right away. What are the causes? A pulmonary embolism occurs most commonly when a blood clot travels from one of your veins to your lungs. Rarely, PE is caused by air, fat, amniotic fluid, or part of a tumor traveling through your veins to your lungs. What increases the risk? A PE is more likely to develop in:  People who smoke.  People who areolder, especially over 74 years of age.  People who are overweight (obese).  People who sit or lie still for a long time, such as during long-distance travel (over 4 hours), bed rest, hospitalization, or during recovery from certain medical conditions like a stroke.  People who do not engage in much physical activity (sedentary lifestyle).  People who have chronic breathing disorders.  People whohave a personal or family history of blood clots or blood clotting disease.  People whohave peripheral vascular disease (PVD), diabetes, or some types of cancer.  People who haveheart disease, especially if the person had a recent heart attack or has congestive heart failure.  People who have neurological diseases that affect the legs (leg  paresis).  People who have had a traumatic injury, such as breaking a hip or leg.  People whohave recently had major or lengthy surgery, especially on the hip, knee, or abdomen.  People who have hada central line placed inside a large vein.  People who takemedicines that contain the hormone estrogen. These include birth control pills and hormone replacement therapy.  Pregnancy or during childbirth or the postpartum period. What are the signs or symptoms? The symptoms of a PE usually start suddenly and include:  Shortness of breath while active or at rest.  Coughing or coughing up blood or blood-tinged mucus.  Chest pain that is often worse with deep breaths.  Rapid or irregular heartbeat.  Feeling light-headed or dizzy.  Fainting.  Feelinganxious.  Sweating. There may also be pain and swelling in a leg if that is where the blood clot started. These symptoms may represent a serious problem that is an emergency. Do not wait to see if the symptoms will go away. Get medical help right away. Call your local emergency services (911 in the [place] Do not drive yourself to the hospital.  How is this diagnosed? Your health care provider will take a medical history and perform a physical exam. You may also have other tests, including:  Blood tests to assess the clotting properties of your blood, assess oxygen levels in your blood, and find blood clots.  Imaging tests, such as CT, ultrasound, MRI, X-ray, and other tests to see if you have clots anywhere in your body.  An electrocardiogram (ECG) to look for heart strain from blood clots in the lungs. How is this treated? The main goals of PE treatment are:  To stop a blood clot from growing larger.  To stop new blood clots from forming. The type of treatment that you receive depends on many factors, such as the cause of your PE, your risk for bleeding or developing more clots, and other medical conditions that you have. Sometimes,  a combination of treatments is necessary. This condition may be treated with:  Medicines, including newer oral blood thinners (anticoagulants), warfarin, low molecular weight heparins, thrombolytics, or heparins.  Wearing compression stockings or using different types of devices.  Surgery (rare) to remove the blood clot or to place a filter in your abdomen to stop the blood clot from traveling to your lungs. Treatments for a PE are often divided into immediate treatment, long-term treatment (up to 3 months after PE), and extended treatment (more than 3 months after PE). Your treatment may continue for several months. This is called maintenance therapy, and it is used to prevent the forming of new blood clots. You can work with your health care provider to choose the treatment program that is best for you. What are anticoagulants?  Anticoagulants are medicines that treat PEs. They can stop current blood clots from growing and stop new clots from forming. They cannot dissolve existing clots. Your body dissolves clots by itself over time. Anticoagulants are given by mouth, by injection, or through an IV tube. What are thrombolytics?  Thrombolytics are clot-dissolving medicines that are used to dissolve a  PE. They carry a high risk of bleeding, so they tend to be used only in severe cases or if you have very low blood pressure. Follow these instructions at home: If you are taking a newer oral anticoagulant:  Take the medicine every single day at the same time each day.  Understand what foods and drugs interact with this medicine.  Understand that there are no regular blood tests required when using this medicine.  Understandthe side effects of this medicine, including excessive bruising or bleeding. Ask your health care provider or pharmacist about other possible side effects. If you are taking warfarin:  Understand how to take warfarin and know which foods can affect how warfarin works in Insurance claims handler.  Understand that it is dangerous to taketoo much or too little warfarin. Too much warfarin increases the risk of bleeding. Too little warfarin continues to allow the risk for blood clots.  Follow your PT and INR blood testing schedule. The PT and INR results allow your health care provider to adjust your dose of warfarin. It is very important that you have your PT and INR tested as often as told by your health care provider.  Avoid major changes in your diet, or tell your health care provider before you change your diet. Arrange a visit with a registered dietitian to answer your questions. Many foods, especially foods that are high in vitamin K, can interfere with warfarin and affect the PT and INR results. Eat a consistent amount of foods that are high in vitamin K, such as:  Spinach, kale, broccoli, cabbage, collard greens, turnip greens, Brussels sprouts, peas, cauliflower, seaweed, and parsley.  Beef liver and pork liver.  Green tea.  Soybean oil.  Tell your health care provider about any and all medicines, vitamins, and supplements that you take, including aspirin and other over-the-counter anti-inflammatory medicines. Be especially cautious with aspirin and anti-inflammatory medicines. Do not take those before you ask your health care provider if it is safe to do so. This is important because many medicines can interfere with warfarin and affect the PT and INR results.  Do not start or stop taking any over-the-counter or prescription medicine unless your health care provider or pharmacist tells you to do so. If you take warfarin, you will also need to do these things:  Hold pressure over cuts for longer than usual.  Tell your dentist and other health care providers that you are taking warfarin before you have any procedures in which bleeding may occur.  Avoid alcohol or drink very small amounts. Tell your health care provider if you change your alcohol intake.  Do not use  tobacco products, including cigarettes, chewing tobacco, and e-cigarettes. If you need help quitting, ask your health care provider.  Avoid contact sports. General instructions  Take over-the-counter and prescription medicines only as told by your health care provider. Anticoagulant medicines can have side effects, including easy bruising and difficulty stopping bleeding. If you are prescribed an anticoagulant, you will also need to do these things:  Hold pressure over cuts for longer than usual.  Tell your dentist and other health care providers that you are taking anticoagulants before you have any procedures in which bleeding may occur.  Avoid contact sports.  Wear a medical alert bracelet or carry a medical alert card that says you have had a PE.  Ask your health care provider how soon you can go back to your normal activities. Stay active to prevent new blood clots from forming.  Make sure to exercise while traveling or when you have been sitting or standing for a long period of time. It is very important to exercise. Exercise your legs by walking or by tightening and relaxing your leg muscles often. Take frequent walks.  Wear compression stockings as told by your health care provider to help prevent more blood clots from forming.  Do not use tobacco products, including cigarettes, chewing tobacco, and e-cigarettes. If you need help quitting, ask your health care provider.  Keep all follow-up appointments with your health care provider. This is important. How is this prevented? Take these actions to decrease your risk of developing another PE:  Exercise regularly. For at least 30 minutes every day, engage in:  Activity that involves moving your arms and legs.  Activity that encourages good blood flow through your body by increasing your heart rate.  Exercise your arms and legs every hour during long-distance travel (over 4 hours). Drink plenty of water and avoid drinking alcohol  while traveling.  Avoid sitting or lying in bed for long periods of time without moving your legs.  Maintain a weight that is appropriate for your height. Ask your health care provider what weight is healthy for you.  If you are a woman who is over 10 years of age, avoid unnecessary use of medicines that contain estrogen. These include birth control pills.  Do not smoke, especially if you take estrogen medicines. If you need help quitting, ask your health care provider.  If you are at very high risk for PE, wear compression stockings.  If you recently had a PE, have regularly scheduled ultrasound testing on your legs to check for new blood clots. If you are hospitalized, prevention measures may include:  Early walking after surgery, as soon as your health care provider says that it is safe.  Receiving anticoagulants to prevent blood clots. If you cannot take anticoagulants, other options may be available, such as wearing compression stockings or using different types of devices. Get help right away if:  You have new or increased pain, swelling, or redness in an arm or leg.  You have numbness or tingling in an arm or leg.  You have shortness of breath while active or at rest.  You have chest pain.  You have a rapid or irregular heartbeat.  You feel light-headed or dizzy.  You cough up blood.  You notice blood in your vomit, bowel movement, or urine.  You have a fever. These symptoms may represent a serious problem that is an emergency. Do not wait to see if the symptoms will go away. Get medical help right away. Call your local emergency services (911 in the [place] Do not drive yourself to the hospital.  This information is not intended to replace advice given to you by your health care provider. Make sure you discuss any questions you have with your health care provider. Document Released: 07/06/2000 Document Revised: 12/15/2015 Document Reviewed: 11/03/2014 Elsevier  Interactive Patient Education  2017 [employer].

## 2016-08-25 NOTE — Progress Notes (Signed)
PROGRESS NOTE    Nicole [name]  [id number] DOB: January 20, 1931 DOA: 08/22/2016 PCP: Hollace Kinnier, DO     Brief Narrative:  83 yo female with history of intracerebral hemorrhage on 2016, presents with chest pain, 8/10 in intensity, pleuritic in nature. On initial examination patient hemodynamically stable, noted D dimer elevated, CT chest positive for PE. Patient started on IV heparin and consulted neurology in regards for anticoagulation, follow on echocardiogram.    Assessment & Plan:   Principal Problem:   Chest pain Active Problems:   HTN (hypertension)   Hypothyroidism   ICH (intracerebral hemorrhage) (HCC)   Chronic atrial fibrillation (HCC)   Type 2 diabetes mellitus with peripheral neuropathy ([place])   Positive D dimer   Pulmonary embolus (HCC)   PE (pulmonary thromboembolism) ([place])   1. Acute pulmonary embolism. Will change anticoagulation from heparin drip to apixaban, blood pressure has been stable, echocardiogram with mild dilatation of the right ventricle, with peak PA pressures at 50 mmHg. Patient oxygenating well, will continue full dose anticoagulation. Case discussed with neurology, CVA in 2016 occurred while patient had uncontrolled HTN and supratherapeutic INR, CVA at the basal ganglia.   2. Atrial fibrillation. Continue rate control metoprolol, will start anticoagulation with apixaban.    3. Diastolic heart failure. Clinically euvolemic, echocardiogram with normal LV systolic function, will continue blood pressure with hydralazine, clonidine and isosorbide.   4. Hypothyroid. Continue levothyroxine.   5. Dementia. On donepezil, no agitation. Patient's husband at the bedside.   6. T2DM. Will continue insulin sliding scale for glucose cover and monitoring, capillary glucose 131-135-162   7. CKD stage 3. Stable cr at 1.2. Continue supportive care, avoid hypotension or nephrotoxic medications.   8. HTN. Continue blood pressure control with hydralazine,  clonidine, and isosorbide. Systolic [id number] to [id number], will add low dose lisinopril for better blood pressure control.    DVT prophylaxis: IV heparin  Code Status: DNR  Family Communication: I spoke with patient's husband  at the bedside and all questions were addressed.  Disposition Plan: Home  Consultants:   Neurology   Procedures:   Antimicrobials:    Subjective: Patient with no chest pain or dyspnea, no cough, no nausea or vomiting, no headache.   Objective: Vitals:   08/25/16 0425 08/25/16 0604 08/25/16 0751 08/25/16 1034  BP: (!) 150/92  (!) 171/92 (!) 147/99  Pulse: 74  (!) 114 73  Resp: 14  (!) 25   Temp: 98.5 F (36.9 C)  98.6 F (37 C)   TempSrc: Oral  Oral   SpO2: 98%  97%   Weight:  74.6 kg (164 lb 8 oz)    Height:        Intake/Output Summary (Last 24 hours) at 08/25/16 1110 Last data filed at 08/25/16 0900  Gross per 24 hour  Intake             1968 ml  Output              650 ml  Net             1318 ml   Filed Weights   08/24/16 0635 08/25/16 0300 08/25/16 0604  Weight: 71.2 kg (157 lb) 74.6 kg (164 lb 8 oz) 74.6 kg (164 lb 8 oz)    Examination:  General exam: Not in pain or dyspnea E ENT: no pallor or icterus, oral mucosa moist Respiratory system: Clear to auscultation. Respiratory effort normal. Cardiovascular system: S1 & S2 heard, RRR. No JVD, murmurs, rubs,  gallops or clicks. No pedal edema. Gastrointestinal system: Abdomen is nondistended, soft and nontender. No organomegaly or masses felt. Normal bowel sounds heard. Central nervous system: Alert and oriented. No focal neurological deficits. Extremities: Symmetric 5 x 5 power. Skin: No rashes, lesions or ulcers    Data Reviewed: I have personally reviewed following labs and imaging studies  CBC:  Recent Labs Lab 08/22/16 1505 08/23/16 0347 08/24/16 0554 08/25/16 0432  WBC 6.6 12.1* 10.1 7.6  NEUTROABS  --  9.7*  --   --   HGB 11.6* 10.6* 10.6* 10.0*  HCT 34.3* 31.6* 31.3* 29.4*    MCV 70.6* 70.2* 70.3* 69.8*  PLT 287 267 235 [id number]   Basic Metabolic Panel:  Recent Labs Lab 08/22/16 1505 08/23/16 0347 08/24/16 0554 08/25/16 0432  NA 139 139 138 137  K 4.2 4.2 4.0 4.0  CL 108 108 108 108  CO2 24 23 21* 23  GLUCOSE 200* 163* 125* 127*  BUN 19 18 17 12   CREATININE 1.38* 1.24* 1.32* 1.26*  CALCIUM 9.1 8.7* 8.6* 8.5*   GFR: Estimated Creatinine Clearance: 34.1 mL/min (by C-G formula based on SCr of 1.26 mg/dL (H)). Liver Function Tests: No results for input(s): AST, ALT, ALKPHOS, BILITOT, PROT, ALBUMIN in the last 168 hours. No results for input(s): LIPASE, AMYLASE in the last 168 hours. No results for input(s): AMMONIA in the last 168 hours. Coagulation Profile:  Recent Labs Lab 08/22/16 1724  INR 1.15   Cardiac Enzymes:  Recent Labs Lab 08/23/16 0347  TROPONINI <0.03   BNP (last 3 results) No results for input(s): PROBNP in the last 8760 hours. HbA1C: No results for input(s): HGBA1C in the last 72 hours. CBG:  Recent Labs Lab 08/24/16 0738 08/24/16 1213 08/24/16 1620 08/24/16 2029 08/25/16 0746  GLUCAP 123* 150* 119* 131* 135*   Lipid Profile: No results for input(s): CHOL, HDL, LDLCALC, TRIG, CHOLHDL, LDLDIRECT in the last 72 hours. Thyroid Function Tests: No results for input(s): TSH, T4TOTAL, FREET4, T3FREE, THYROIDAB in the last 72 hours. Anemia Panel: No results for input(s): VITAMINB12, FOLATE, FERRITIN, TIBC, IRON, RETICCTPCT in the last 72 hours. Sepsis Labs:  Recent Labs Lab 08/22/16 1746  LATICACIDVEN 1.67    No results found for this or any previous visit (from the past 240 hour(s)).       Radiology Studies: Ct Angio Chest Pe W Or Wo Contrast  Result Date: 08/23/2016 CLINICAL DATA:  Acute chest pain. Substernal chest pain. Worsening short of breath with exertion. Positive D-dimer. EXAM: CT ANGIOGRAPHY CHEST WITH CONTRAST TECHNIQUE: Multidetector CT imaging of the chest was performed using the standard protocol  during bolus administration of intravenous contrast. Multiplanar CT image reconstructions and MIPs were obtained to evaluate the vascular anatomy. CONTRAST:  90 cc Isovue 370 COMPARISON:  None. FINDINGS: Cardiovascular: No filling defects within the main pulmonary arteries. Within the posterior RIGHT lower lobe pulmonary artery there an eccentric filling defect (image 157, series 6). There is mild expansion of the artery compare to adjacent vessels (image 156, series 6). Similarly, in the posterior segment of the LEFT lower lobe pulmonary artery, there is eccentric filling defect (image 164, series 6) with expansion of the vessel. Small filling defects more distally the RIGHT LEFT lower lobe pulmonary artery on image 185 series 6. No upper lobe filling defects identified. The ascending thoracic aorta measures 46 mm. Mediastinum/Nodes: No axillary supraclavicular adenopathy. No pericardial fluid. Esophagus normal. Lungs/Pleura: Bilateral small effusions, RIGHT greater than LEFT. Mild bibasilar atelectasis. No pulmonary edema.  No pneumonia. Upper Abdomen: Limited view of the liver, kidneys, pancreas are unremarkable. Normal adrenal glands. Musculoskeletal: No aggressive osseous lesion. Review of the MIP images confirms the above findings. IMPRESSION: 1. Eccentric filling defect within the posterior lower lobe pulmonary artery on the LEFT and RIGHT are most consistent with pulmonary emboli. The findings have some features of subacute pulmonary emboli. 2. Overall clot burden is minimal.  No RIGHT ventricular strain. 3. Bilateral small effusions, RIGHT greater LEFT. 4. Ascending thoracic aorta measures 46 mm. Ascending thoracic aortic aneurysm. Recommend semi-annual imaging followup by CTA or MRA and referral to cardiothoracic surgery if not already obtained. This recommendation follows 2010 ACCF/AHA/AATS/ACR/ASA/SCA/SCAI/SIR/STS/SVM Guidelines for the Diagnosis and Management of Patients With Thoracic Aortic Disease.  Circulation. 2010; [id number] Electronically Signed   By: Suzy Bouchard M.D.   On: 08/23/2016 12:58        Scheduled Meds: . allopurinol  100 mg Oral Daily  . aspirin EC  81 mg Oral Daily  . cloNIDine  0.2 mg Oral TID  . donepezil  10 mg Oral QHS  . hydrALAZINE  50 mg Oral TID  . insulin aspart  0-9 Units Subcutaneous TID WC  . isosorbide mononitrate  30 mg Oral QHS  . levothyroxine  50 mcg Oral QAC breakfast  . metoprolol  100 mg Oral BID   Continuous Infusions: . heparin 1,450 Units/hr (08/25/16 1036)  . sodium chloride 0.9 % 1,000 mL infusion 50 mL/hr at 08/25/16 0422     LOS: 1 day       Tawni Millers, MD [facility] Pager [phone]  If 7PM-7AM, please contact night-coverage [email or web address] Password [email or web address] 08/25/2016, 11:10 AM

## 2016-08-25 NOTE — Progress Notes (Addendum)
[place] for heparin >> Eliquis  Indication: pulmonary embolus  Allergies  Allergen Reactions  . Iohexol Nausea And Vomiting  . Iodinated Diagnostic Agents Nausea And Vomiting  . Z-Pak [Azithromycin] Other (See Comments)    Unknown allergic reaction   Patient Measurements: Height: 5\' 8"  (172.7 cm) Weight: 164 lb 8 oz (74.6 kg) IBW/kg (Calculated) : 63.9  Vital Signs: Temp: 98.6 F (37 C) (02/03 0751) Temp Source: Oral (02/03 0751) BP: 171/92 (02/03 0751) Pulse Rate: 114 (02/03 0751)  Labs:  Recent Labs  08/22/16 1724 08/23/16 0347  08/24/16 0554 08/24/16 1501 08/25/16 0432  HGB  --  10.6*  --  10.6*  --  10.0*  HCT  --  31.6*  --  31.3*  --  29.4*  PLT  --  267  --  235  --  213  LABPROT 14.8  --   --   --   --   --   INR 1.15  --   --   --   --   --   HEPARINUNFRC  --   --   < > 0.21* 0.31 0.26*  CREATININE  --  1.24*  --  1.32*  --  1.26*  TROPONINI  --  <0.03  --   --   --   --   < > = values in this interval not displayed.  Estimated Creatinine Clearance: 34.1 mL/min (by C-G formula based on SCr of 1.26 mg/dL (H)).  Assessment: 83 y.o. female with PE for Eliquis. Patient previously on heparin and is transitioning to Eliquis. Patient does have history of ICH, so education, monitoring, close follow-up will be important. CBC stable. No s/s bleeding.    Plan:  1. D/C heparin gtt  2. Start Eliquis 10mg  BID x7 days, then 5mg  BID  3.   Monitor for s/s bleeding 4    Monitor renal function   Argie Ramming, PharmD Pharmacy Resident  Pager [phone] 08/25/16 10:20 AM

## 2016-08-26 ENCOUNTER — Other Ambulatory Visit: Payer: Self-pay | Admitting: Internal Medicine

## 2016-08-26 LAB — CBC
HEMATOCRIT: 28.8 % — AB (ref 36.0–46.0)
Hemoglobin: 9.7 g/dL — ABNORMAL LOW (ref 12.0–15.0)
MCH: 23.5 pg — ABNORMAL LOW (ref 26.0–34.0)
MCHC: 33.7 g/dL (ref 30.0–36.0)
MCV: 69.7 fL — AB (ref 78.0–100.0)
PLATELETS: 242 10*3/uL (ref 150–400)
RBC: 4.13 MIL/uL (ref 3.87–5.11)
RDW: 16.5 % — ABNORMAL HIGH (ref 11.5–15.5)
WBC: 6.5 10*3/uL (ref 4.0–10.5)

## 2016-08-26 LAB — BASIC METABOLIC PANEL
Anion gap: 5 (ref 5–15)
BUN: 11 mg/dL (ref 6–20)
CO2: 24 mmol/L (ref 22–32)
Calcium: 8.4 mg/dL — ABNORMAL LOW (ref 8.9–10.3)
Chloride: 109 mmol/L (ref 101–111)
Creatinine, Ser: 1.08 mg/dL — ABNORMAL HIGH (ref 0.44–1.00)
GFR calc Af Amer: 53 mL/min — ABNORMAL LOW (ref >60)
GFR, EST NON AFRICAN AMERICAN: 46 mL/min — AB (ref >60)
GLUCOSE: 109 mg/dL — AB (ref 65–99)
POTASSIUM: 3.9 mmol/L (ref 3.5–5.1)
Sodium: 138 mmol/L (ref 135–145)

## 2016-08-26 LAB — GLUCOSE, CAPILLARY
Glucose-Capillary: 120 mg/dL — ABNORMAL HIGH (ref 65–99)
Glucose-Capillary: 165 mg/dL — ABNORMAL HIGH (ref 65–99)

## 2016-08-26 MED ORDER — APIXABAN 5 MG PO TABS: 10.0000 mg | ORAL_TABLET | Freq: Two times a day (BID) | ORAL | 0 refills | Status: DC

## 2016-08-26 MED ORDER — APIXABAN 5 MG PO TABS: 5.0000 mg | ORAL_TABLET | Freq: Two times a day (BID) | ORAL | 0 refills | Status: DC

## 2016-08-26 MED ORDER — LISINOPRIL 5 MG PO TABS: 5.0000 mg | ORAL_TABLET | Freq: Every day | ORAL | 0 refills | Status: DC

## 2016-08-26 NOTE — Discharge Summary (Signed)
Physician Discharge Summary  Nicole [name] [id number] DOB: 11/10/1930 DOA: 08/22/2016  PCP: Hollace Kinnier, DO  Admit date: 08/22/2016 Discharge date: 08/26/2016  Admitted From: Home Disposition:  Home   Recommendations for Outpatient Follow-up:  1. Follow up with PCP in 1-week 2. Patient has been placed on anticoagulation with apixbaban.  3. Lisinopril 5 mg was added to blood pressure regimen 4. Aldactone has been discontinued.  5. Incidental ascending thoracic aneurysm, 46 mm recommendation for semiannual imaging  Home Health: NO  Equipment/Devices: NA  Discharge Condition: Stable  CODE STATUS: DNR Diet recommendation: Heart Healthy   Brief/Interim Summary: This is a 83 yo female with history of atrial fibrillation and hemorrhagic CVA in 2016. Presents to the hospital with the chief complaint of chest pain. Patient developed substernal, constant, dull, 8 out of 10 intensity chest pain. Symptoms were worse with deep inspiration and exertion. The chest pain was associated with dyspnea and lightheadedness. No syncope. On initial physical examination blood pressure was 150 01/21/2000, heart rate 68, respiratory 21, oxygen saturation 100%. Her oral mucosa was moist, neck was supple, her lungs were clear to auscultation bilaterally, heart S1-S2 present irregularly irregular, abdomen soft, lower extremities no edema. Sodium 139, potassium 4.2, chloride 108, bicarbonate 24, BUN 19, creatinine 1.38, white count 6.6, hemoglobin 11.6, hematocrit 34.3, platelets 287, d-dimer 2.72, influenza A and B-negative. Her chest film was negative for infiltrates. CT chest showed eccentric filling defect within the posterior lower lobe pulmonary artery on the left and right consistent with pulmonary embolism. Her EKG had atrial fibrillation with right bundle branch block.   The patient was admitted to hospital with the working diagnosis of chest pain due to acute pulmonary embolism.  1. Acute pulmonary  embolism. The patient was admitted to the medical unit with telemetry monitor, she was placed on IV heparin with good toleration. Echocardiogram showed ejection fraction 55-60% on the left ventricle, right ventricle had mildly dilated cavity with a PA systolic pressure increased, peak 50 mmHg. Patient was transitioned to apixaban, to continue anticoagulation. Apparently patient's CVA was localized in the basal ganglia back then her blood pressure was 166 /103 and her INR was 3.0. Case was discussed with neurology, consider risk versus benefit. The patient will be placed on anticoagulation with a close follow-up on her blood pressure. This was discussed with patient's family as well at bedside.  2. Uncontrolled hypertension. Patient was continued on metoprolol, isosorbide, clonidine, hydralazine, lisinopril has been added to her medical regimen. Will recommend up titration as tolerated in the outpatient setting. Resume furosemide, will hold Aldactone for now, to prevent hyperkalemia.   3. Dementia. Will continue donepezil.   4. Hypothyroidism. Continue levothyroxine.   5. Diastolic heart failure. Continue blood pressure control, resume furosemide. Hold Aldactone for now.  6. Type 2 diabetes mellitus. Patient was placed on insulin sliding scale for glucose coverage and monitoring. Her capillary glucose remained well-controlled.   7. Atrial fibrillation. Patient remained rate control with metoprolol, patient has been placed on anticoagulation with apixaban. Close follow-up as an outpatient.  8. Dementia. Continue donepezil.   Discharge Diagnoses:  Principal Problem:   Chest pain Active Problems:   HTN (hypertension)   Hypothyroidism   ICH (intracerebral hemorrhage) (HCC)   Chronic atrial fibrillation (HCC)   Type 2 diabetes mellitus with peripheral neuropathy (HCC)   Positive D dimer   Pulmonary embolus (HCC)   PE (pulmonary thromboembolism) ([place])    Discharge Instructions   Allergies  as of 08/26/2016  Reactions   Iohexol Nausea And Vomiting   Iodinated Diagnostic Agents Nausea And Vomiting   Z-pak [azithromycin] Other (See Comments)   Unknown allergic reaction      Medication List    STOP taking these medications   aspirin EC 81 MG tablet   spironolactone 25 MG tablet Commonly known as:  ALDACTONE     TAKE these medications   allopurinol 100 MG tablet Commonly known as:  ZYLOPRIM Take 100 mg by mouth daily.   apixaban 5 MG Tabs tablet Commonly known as:  ELIQUIS Take 2 tablets (10 mg total) by mouth 2 (two) times daily. Take for 7 days.   apixaban 5 MG Tabs tablet Commonly known as:  ELIQUIS Take 1 tablet (5 mg total) by mouth 2 (two) times daily. Start after completing 10 mg twice a day for 7 days. Start taking on:  09/01/2016   cloNIDine 0.2 MG tablet Commonly known as:  CATAPRES take 1 tablet by mouth three times a day for hypertension   donepezil 10 MG tablet Commonly known as:  ARICEPT Take 1 tablet (10 mg total) by mouth at bedtime.   furosemide 20 MG tablet Commonly known as:  LASIX Take 20 mg by mouth See admin instructions. Take 1 tablet (20 mg) by mouth every other day, may also take one additional tablet daily for weight gain greater than 3 lbs.   hydrALAZINE 50 MG tablet Commonly known as:  APRESOLINE Take 1 tablet (50 mg total) by mouth 3 (three) times daily.   isosorbide mononitrate 30 MG 24 hr tablet Commonly known as:  IMDUR Take 30 mg by mouth at bedtime.   levothyroxine 50 MCG tablet Commonly known as:  SYNTHROID, LEVOTHROID Take 1 tablet (50 mcg total) by mouth daily before breakfast.   lisinopril 5 MG tablet Commonly known as:  PRINIVIL,ZESTRIL Take 1 tablet (5 mg total) by mouth daily. Start taking on:  08/27/2016   metoprolol 100 MG tablet Commonly known as:  LOPRESSOR Take 1 tablet (100 mg total) by mouth 2 (two) times daily.   polyethylene glycol packet Commonly known as:  MIRALAX / GLYCOLAX Take 17 g by  mouth daily as needed for moderate constipation. What changed:  additional instructions   potassium chloride 10 MEQ tablet Commonly known as:  K-DUR Take 1 tablet (10 mEq total) by mouth every other day.       Allergies  Allergen Reactions  . Iohexol Nausea And Vomiting  . Iodinated Diagnostic Agents Nausea And Vomiting  . Z-Pak [Azithromycin] Other (See Comments)    Unknown allergic reaction     Consultations:  Neurology    Procedures/Studies: Dg Chest 2 View  Result Date: 08/22/2016 CLINICAL DATA:  Chest pain, shortness of breath which began this morning. History of CHF, hypertension, pulmonary hypertension, colonic malignancy, peripheral vascular disease, and diabetes. EXAM: CHEST  2 VIEW COMPARISON:  PA and lateral chest x-ray of May 01, 2016 FINDINGS: The lungs are mildly hyperinflated with hemidiaphragm flattening. There is no focal infiltrate. The cardiac silhouette is enlarged. The central pulmonary vascularity is mildly prominent. No pulmonary interstitial edema is observed. There is a small right pleural effusion. There is calcification in the wall of the aortic arch. The bony thorax exhibits no acute abnormality. IMPRESSION: COPD. Cardiomegaly with out definite pulmonary edema. Small right pleural effusion No focal pneumonia. Thoracic aortic atherosclerosis. Electronically Signed   By: David  Martinique M.D.   On: 08/22/2016 15:34   Ct Angio Chest Pe W Or Wo Contrast  Result Date: 08/23/2016 CLINICAL DATA:  Acute chest pain. Substernal chest pain. Worsening short of breath with exertion. Positive D-dimer. EXAM: CT ANGIOGRAPHY CHEST WITH CONTRAST TECHNIQUE: Multidetector CT imaging of the chest was performed using the standard protocol during bolus administration of intravenous contrast. Multiplanar CT image reconstructions and MIPs were obtained to evaluate the vascular anatomy. CONTRAST:  90 cc Isovue 370 COMPARISON:  None. FINDINGS: Cardiovascular: No filling defects within  the main pulmonary arteries. Within the posterior RIGHT lower lobe pulmonary artery there an eccentric filling defect (image 157, series 6). There is mild expansion of the artery compare to adjacent vessels (image 156, series 6). Similarly, in the posterior segment of the LEFT lower lobe pulmonary artery, there is eccentric filling defect (image 164, series 6) with expansion of the vessel. Small filling defects more distally the RIGHT LEFT lower lobe pulmonary artery on image 185 series 6. No upper lobe filling defects identified. The ascending thoracic aorta measures 46 mm. Mediastinum/Nodes: No axillary supraclavicular adenopathy. No pericardial fluid. Esophagus normal. Lungs/Pleura: Bilateral small effusions, RIGHT greater than LEFT. Mild bibasilar atelectasis. No pulmonary edema. No pneumonia. Upper Abdomen: Limited view of the liver, kidneys, pancreas are unremarkable. Normal adrenal glands. Musculoskeletal: No aggressive osseous lesion. Review of the MIP images confirms the above findings. IMPRESSION: 1. Eccentric filling defect within the posterior lower lobe pulmonary artery on the LEFT and RIGHT are most consistent with pulmonary emboli. The findings have some features of subacute pulmonary emboli. 2. Overall clot burden is minimal.  No RIGHT ventricular strain. 3. Bilateral small effusions, RIGHT greater LEFT. 4. Ascending thoracic aorta measures 46 mm. Ascending thoracic aortic aneurysm. Recommend semi-annual imaging followup by CTA or MRA and referral to cardiothoracic surgery if not already obtained. This recommendation follows 2010 ACCF/AHA/AATS/ACR/ASA/SCA/SCAI/SIR/STS/SVM Guidelines for the Diagnosis and Management of Patients With Thoracic Aortic Disease. Circulation. 2010; [id number] Electronically Signed   By: Suzy Bouchard M.D.   On: 08/23/2016 12:58   Nm Pulmonary Perf And Vent  Result Date: 08/22/2016 CLINICAL DATA:  83 y/o F; chest pain and shortness of breath. Positive D-dimer.  EXAM: NUCLEAR MEDICINE VENTILATION - PERFUSION LUNG SCAN TECHNIQUE: Ventilation images were obtained in multiple projections using inhaled aerosol Tc-52m DTPA. Perfusion images were obtained in multiple projections after intravenous injection of Tc-61m MAA. RADIOPHARMACEUTICALS:  31 mCi Technetium-43m DTPA aerosol inhalation and 3.9 mCi Technetium-87m MAA IV COMPARISON:  08/22/2016 chest radiograph. FINDINGS: Ventilation: Diffusely heterogeneous ventilation probably representing airways disease. Perfusion: Large wedge-shaped mismatched ventilation perfusion defect within the right lower lobe. No radiographic correlate. IMPRESSION: 1. Single large V/Q mismatch in the right lower lobe. Intermediate probability of pulmonary embolus. 2. Diffuse heterogeneity of ventilation tracer uptake likely represents airways disease. Electronically Signed   By: Kristine Garbe M.D.   On: 08/22/2016 22:17    (Echo, Carotid, EGD, Colonoscopy, ERCP)    Subjective: Patient feeling better, no nausea or vomiting, dyspnea has improved. No chest pain.   Discharge Exam: Vitals:   08/26/16 0508 08/26/16 1039  BP: (!) 159/95 (!) 166/112  Pulse: 69 74  Resp: 19   Temp: 98 F (36.7 C)    Vitals:   08/25/16 2149 08/26/16 0416 08/26/16 0508 08/26/16 1039  BP: (!) 152/89  (!) 159/95 (!) 166/112  Pulse: 67  69 74  Resp:   19   Temp:   98 F (36.7 C)   TempSrc:   Oral   SpO2:   98%   Weight:  73.2 kg (161 lb 6 oz)  Height:        General: Pt is alert, awake, not in acute distress Cardiovascular: RRR, S1/S2 +, no rubs, no gallops Respiratory: CTA bilaterally, no wheezing, no rhonchi Abdominal: Soft, NT, ND, bowel sounds + Extremities: no edema, no cyanosis    The results of significant diagnostics from this hospitalization (including imaging, microbiology, ancillary and laboratory) are listed below for reference.     Microbiology: No results found for this or any previous visit (from the past 240  hour(s)).   Labs: BNP (last 3 results)  Recent Labs  04/09/16 1640 05/01/16 1337 08/22/16 2256  BNP 1,588.5* 383.1* [id number]*   Basic Metabolic Panel:  Recent Labs Lab 08/22/16 1505 08/23/16 0347 08/24/16 0554 08/25/16 0432 08/26/16 0159  NA 139 139 138 137 138  K 4.2 4.2 4.0 4.0 3.9  CL 108 108 108 108 109  CO2 24 23 21* 23 24  GLUCOSE 200* 163* 125* 127* 109*  BUN 19 18 17 12 11   CREATININE 1.38* 1.24* 1.32* 1.26* 1.08*  CALCIUM 9.1 8.7* 8.6* 8.5* 8.4*   Liver Function Tests: No results for input(s): AST, ALT, ALKPHOS, BILITOT, PROT, ALBUMIN in the last 168 hours. No results for input(s): LIPASE, AMYLASE in the last 168 hours. No results for input(s): AMMONIA in the last 168 hours. CBC:  Recent Labs Lab 08/22/16 1505 08/23/16 0347 08/24/16 0554 08/25/16 0432 08/26/16 0159  WBC 6.6 12.1* 10.1 7.6 6.5  NEUTROABS  --  9.7*  --   --   --   HGB 11.6* 10.6* 10.6* 10.0* 9.7*  HCT 34.3* 31.6* 31.3* 29.4* 28.8*  MCV 70.6* 70.2* 70.3* 69.8* 69.7*  PLT 287 267 235 213 242   Cardiac Enzymes:  Recent Labs Lab 08/23/16 0347  TROPONINI <0.03   BNP: Invalid input(s): POCBNP CBG:  Recent Labs Lab 08/25/16 1133 08/25/16 1659 08/25/16 2220 08/26/16 0738 08/26/16 1150  GLUCAP 162* 105* 162* 120* 165*   D-Dimer No results for input(s): DDIMER in the last 72 hours. Hgb A1c No results for input(s): HGBA1C in the last 72 hours. Lipid Profile No results for input(s): CHOL, HDL, LDLCALC, TRIG, CHOLHDL, LDLDIRECT in the last 72 hours. Thyroid function studies No results for input(s): TSH, T4TOTAL, T3FREE, THYROIDAB in the last 72 hours.  Invalid input(s): FREET3 Anemia work up No results for input(s): VITAMINB12, FOLATE, FERRITIN, TIBC, IRON, RETICCTPCT in the last 72 hours. Urinalysis    Component Value Date/Time   COLORURINE YELLOW 07/20/2016 2021   APPEARANCEUR CLEAR 07/20/2016 2021   LABSPEC 1.013 07/20/2016 2021   PHURINE 5.0 07/20/2016 2021    GLUCOSEU NEGATIVE 07/20/2016 2021   HGBUR NEGATIVE 07/20/2016 2021   [place] NEGATIVE 07/20/2016 2021   KETONESUR NEGATIVE 07/20/2016 2021   PROTEINUR NEGATIVE 07/20/2016 2021   UROBILINOGEN 2.0 (H) 04/13/2015 1844   NITRITE NEGATIVE 07/20/2016 2021   LEUKOCYTESUR NEGATIVE 07/20/2016 2021   Sepsis Labs Invalid input(s): PROCALCITONIN,  WBC,  LACTICIDVEN Microbiology No results found for this or any previous visit (from the past 240 hour(s)).   Time coordinating discharge: Over 45 minutes  SIGNED:   Tawni Millers, MD  [facility] 08/26/2016, 1:12 PM Pager   If 7PM-7AM, please contact night-coverage [email or web address] Password [email or web address]

## 2016-08-26 NOTE — Care Management Note (Signed)
Case Management Note  Patient Details  Name: Nicole [name] MRN: [id number] Date of Birth: [birth year]  Subjective/Objective:  83 y.o. To be discharged with 30 day free card. Unable to do benefits check due to unavailability of [employer] on weekends. No additional CM needs at this time.                  Action/Plan: Anticipate discharge home today. No further CM needs but will be available should additional discharge needs arise.   Expected Discharge Date:  08/26/16               Expected Discharge Plan:  Home/Self Care  In-House Referral:  NA  Discharge planning Services  CM Consult, Medication Assistance (Eliquis Card 30 Day free)  Post Acute Care Choice:  NA Choice offered to:  NA  DME Arranged:  N/A DME Agency:  NA  HH Arranged:  NA HH Agency:  NA  Status of Service:  Completed, signed off  If discussed at [place] of Stay Meetings, dates discussed:    Additional Comments:  Delrae Sawyers, RN 08/26/2016, 2:13 PM

## 2016-08-26 NOTE — Progress Notes (Signed)
Report received in patient's room via [place] using SBAR format, reviewed orders, labs, VS, meds and patient's general condition, assumed care of patient.

## 2016-08-28 ENCOUNTER — Telehealth: Payer: Self-pay

## 2016-08-28 NOTE — Telephone Encounter (Signed)
08/27/16-10:40 AM-I have made the 1st attempt to contact the patient or family member in charge, in order to follow up from recently being discharged from the hospital. I spoke with pt's caretaker, who informed me that she was sleeping at this time. I will attempt to call back at another time. AM-LPN/Nurse Health Advisor  08/27/16- Pt's God daughter, [name] called while I was out of the office and scheduled the pt for a TOC F/U appt with Dr. Mariea Clonts on 09/03/16 at 3pm.   08/28/16-4:10 PM- I tried calling the patient again and no one answered. I did leave a msg on the VM explaining who I was and why I was calling. Left office number for pt or family to call back with any questions. AM-LPN/Nurse Health Advisor

## 2016-09-03 ENCOUNTER — Encounter: Payer: Self-pay | Admitting: Internal Medicine

## 2016-09-03 ENCOUNTER — Ambulatory Visit (INDEPENDENT_AMBULATORY_CARE_PROVIDER_SITE_OTHER): Payer: Medicare Other | Admitting: Internal Medicine

## 2016-09-03 VITALS — BP 130/70 | HR 74 | Temp 98.1°F | Wt 160.0 lb

## 2016-09-03 DIAGNOSIS — I482 Chronic atrial fibrillation, unspecified: Secondary | ICD-10-CM

## 2016-09-03 DIAGNOSIS — Z7901 Long term (current) use of anticoagulants: Secondary | ICD-10-CM

## 2016-09-03 DIAGNOSIS — I712 Thoracic aortic aneurysm, without rupture, unspecified: Secondary | ICD-10-CM

## 2016-09-03 DIAGNOSIS — I2699 Other pulmonary embolism without acute cor pulmonale: Secondary | ICD-10-CM | POA: Diagnosis not present

## 2016-09-03 DIAGNOSIS — E875 Hyperkalemia: Secondary | ICD-10-CM

## 2016-09-03 LAB — BASIC METABOLIC PANEL
BUN: 18 mg/dL (ref 7–25)
CO2: 26 mmol/L (ref 20–31)
Calcium: 8.7 mg/dL (ref 8.6–10.4)
Chloride: 106 mmol/L (ref 98–110)
Creat: 1.44 mg/dL — ABNORMAL HIGH (ref 0.60–0.88)
Glucose, Bld: 226 mg/dL — ABNORMAL HIGH (ref 65–99)
Potassium: 4.5 mmol/L (ref 3.5–5.3)
Sodium: 139 mmol/L (ref 135–146)

## 2016-09-03 MED ORDER — POTASSIUM CHLORIDE ER 10 MEQ PO TBCR: 10.0000 meq | EXTENDED_RELEASE_TABLET | ORAL | 3 refills | Status: DC

## 2016-09-03 MED ORDER — FUROSEMIDE 20 MG PO TABS: ORAL_TABLET | ORAL | 3 refills | Status: DC

## 2016-09-03 MED ORDER — APIXABAN 5 MG PO TABS: 5.0000 mg | ORAL_TABLET | Freq: Two times a day (BID) | ORAL | 3 refills | Status: DC

## 2016-09-03 MED ORDER — CLONIDINE HCL 0.2 MG PO TABS: ORAL_TABLET | ORAL | 3 refills | Status: DC

## 2016-09-03 NOTE — Progress Notes (Signed)
Location:  [facility] clinic Provider: Tonia Avino L. Mariea Clonts, D.O., C.M.D.  Code Status: DNR Goals of Care:  Advanced Directives 08/24/2016  Does Patient Have a Medical Advance Directive? Yes  Type of Paramedic of [place];Living will  Does patient want to make changes to medical advance directive? No - Patient declined  Copy of [place] in Chart? No - copy requested  Would patient like information on creating a medical advance directive? -  Pre-existing out of facility DNR order (yellow form or pink MOST form) -     Chief Complaint  Patient presents with  . Transitions Of Care    08/22/2016 - 08/26/2016 (4 days)    HPI: Patient is a 83 y.o. female seen today for hospital follow-up s/p admission from 08/22/16-08/26/16 with chest pain, dyspnea and lightheadedness due to pulmonary embolism--CT revealed eccentric filling defect in posterior lower lobe pulmonary a on left and right c/w PE.  She has been started back on eliquis therapy.  She has had two prior bleeding events (hemorrhagic stroke, large bleed with hematoma of her chin).  Also does have afib with elevated chads2vasc.  She had lisinopril added to her bp regimen and aldactone held at discharge due to hyperkalemia risk with ace which is why she was not on ace.  She was found to have an incidental ascending thoracic aortic aneurysm 49mm with recommendations for semiannual f/u imaging (though she is not a candidate for surgical correction with her moderate dementia and other medical illness).    Today, she still has some pleuritic chest pain in left upper lung during exam and when walking in.  No other concerns.  Sats 94%  Feels otherwise well.  Reviewed findings from hospitalization.    Past Medical History:  Diagnosis Date  . AAA (abdominal aortic aneurysm) ([place])   . Acute upper respiratory infections of unspecified site   . Anemia   . Anxiety   . Aortic aneurysm of unspecified site without mention  of rupture   . Arthritis   . Atrial fibrillation ([place])   . Cervicalgia   . Cholelithiasis   . Chronic systolic heart failure ([place])   . CKD (chronic kidney disease)   . Colon cancer ([place]) 07/1997  . Depression   . Diabetes mellitus ([place])   . Dizziness and giddiness   . Electrolyte and fluid disorders not elsewhere classified   . GERD (gastroesophageal reflux disease)   . Gout, unspecified   . Hemorrhage of rectum and anus   . Hypercholesteremia   . Hypertension   . Hypoglycemia, unspecified   . Hypopotassemia   . Hypothyroidism   . Kidney stone    renal calculi  . Malignant neoplasm of colon, unspecified site   . Other chronic allergic conjunctivitis   . Other specified cardiac dysrhythmias(427.89)   . Pain in joint, lower leg   . Perforation of bile duct   . Proteinuria   . Shortness of breath   . Small bowel obstruction due to adhesions 05/16/2012  . Stroke ([place])   . Swelling, mass, or lump in head and neck   . Thoracic aneurysm without mention of rupture    4.6 cm Asc Aortic aneurysm, CT 07/2015    Past Surgical History:  Procedure Laterality Date  . CARDIAC CATHETERIZATION  2008   moderate severe pulm HTN; with elevted pulm capillary wedge pressure   . CHOLECYSTECTOMY  05/14/2012   Procedure: LAPAROSCOPIC CHOLECYSTECTOMY WITH INTRAOPERATIVE CHOLANGIOGRAM;  Surgeon: Haywood Lasso, MD;  Location: [facility] OR;  Service: General;  Laterality: N/A;  . COLON SURGERY     to remove colon ca  . ERCP  05/17/2012   Procedure: ENDOSCOPIC RETROGRADE CHOLANGIOPANCREATOGRAPHY (ERCP);  Surgeon: Missy Sabins, MD;  Location: [place];  Service: Gastroenterology;  Laterality: N/A;  . ERCP  08/12/2012   Procedure: ENDOSCOPIC RETROGRADE CHOLANGIOPANCREATOGRAPHY (ERCP);  Surgeon: Missy Sabins, MD;  Location[facility] ENDOSCOPY;  Service: Endoscopy;  Laterality: N/A;  . HEMICOLECTOMY  08/11/1997  . KIDNEY STONE SURGERY    . TRANSTHORACIC ECHOCARDIOGRAM  11/2009   EF 45-50%, mild-mod AV regurg;  mild MR; mod TR; ascending aorta mildly dilated    Allergies  Allergen Reactions  . Iohexol Nausea And Vomiting  . Iodinated Diagnostic Agents Nausea And Vomiting  . Z-Pak [Azithromycin] Other (See Comments)    Unknown allergic reaction    Allergies as of 09/03/2016      Reactions   Iohexol Nausea And Vomiting   Iodinated Diagnostic Agents Nausea And Vomiting   Z-pak [azithromycin] Other (See Comments)   Unknown allergic reaction      Medication List       Accurate as of 09/03/16  3:26 PM. Always use your most recent med list.          allopurinol 100 MG tablet Commonly known as:  ZYLOPRIM Take 100 mg by mouth daily.   cloNIDine 0.2 MG tablet Commonly known as:  CATAPRES take 1 tablet by mouth three times a day for hypertension   donepezil 10 MG tablet Commonly known as:  ARICEPT Take 1 tablet (10 mg total) by mouth at bedtime.   ELIQUIS 5 MG Tabs tablet Generic drug:  apixaban Take 5 mg by mouth daily.   furosemide 20 MG tablet Commonly known as:  LASIX Take 20 mg by mouth See admin instructions. Take 1 tablet (20 mg) by mouth every other day, may also take one additional tablet daily for weight gain greater than 3 lbs.   hydrALAZINE 50 MG tablet Commonly known as:  APRESOLINE Take 1 tablet (50 mg total) by mouth 3 (three) times daily.   isosorbide mononitrate 30 MG 24 hr tablet Commonly known as:  IMDUR TAKE 1 TABLET BY MOUTH  DAILY   levothyroxine 50 MCG tablet Commonly known as:  SYNTHROID, LEVOTHROID Take 1 tablet (50 mcg total) by mouth daily before breakfast.   lisinopril 5 MG tablet Commonly known as:  PRINIVIL,ZESTRIL Take 1 tablet (5 mg total) by mouth daily.   metoprolol 100 MG tablet Commonly known as:  LOPRESSOR Take 1 tablet (100 mg total) by mouth 2 (two) times daily.   polyethylene glycol packet Commonly known as:  MIRALAX / GLYCOLAX Take 17 g by mouth daily as needed for moderate constipation.   potassium chloride 10 MEQ  tablet Commonly known as:  K-DUR Take 1 tablet (10 mEq total) by mouth every other day.       Review of Systems:  Review of Systems  Constitutional: Positive for malaise/fatigue. Negative for chills and fever.  Respiratory: Positive for shortness of breath. Negative for cough, sputum production and wheezing.   Cardiovascular: Positive for chest pain. Negative for palpitations, orthopnea, claudication, leg swelling and PND.  Gastrointestinal: Negative for abdominal pain.  Genitourinary: Negative for dysuria.  Musculoskeletal: Negative for falls.  Neurological: Negative for dizziness, loss of consciousness and weakness.  Psychiatric/Behavioral: Positive for memory loss.    Health Maintenance  Topic Date Due  . OPHTHALMOLOGY EXAM  03/22/1943  . ZOSTAVAX  03/21/1993  .  MAMMOGRAM  09/08/2016  . HEMOGLOBIN A1C  11/21/2016  . FOOT EXAM  05/24/2017  . TETANUS/TDAP  09/16/2021  . INFLUENZA VACCINE  Completed  . DEXA SCAN  Completed  . PNA vac Low Risk Adult  Completed    Physical Exam: Vitals:   09/03/16 1506  BP: 130/70  Pulse: 74  Temp: 98.1 F (36.7 C)  TempSrc: Oral  SpO2: 94%  Weight: 160 lb (72.6 kg)   Body mass index is 24.33 kg/m. Physical Exam  Constitutional: She appears well-developed and well-nourished. No distress.  Cardiovascular: Intact distal pulses.   irreg irreg  Pulmonary/Chest: Effort normal and breath sounds normal. No respiratory distress.  Musculoskeletal: Normal range of motion.  Walks w/o assistive device  Neurological: She is alert.  Skin: Skin is warm and dry.  Psychiatric: She has a normal mood and affect.   Labs reviewed: Basic Metabolic Panel:  Recent Labs  10/12/15 0928  01/08/16 0508  02/17/16 1259 03/14/16 1608  04/11/16 1002  08/24/16 0554 08/25/16 0432 08/26/16 0159  NA 139  < > 138  < > 138 139  < > 139  < > 138 137 138  K 4.0  < > 3.3*  < > 4.1 4.5  < > 3.8  < > 4.0 4.0 3.9  CL 100  < > 99*  < > 105 106  < > 104  < >  108 108 109  CO2 23  < > 29  < > 26 23  < > 29  < > 21* 23 24  GLUCOSE 120*  < > 144*  < > 147* 100*  < > 128*  < > 125* 127* 109*  BUN 12  < > 18  < > 21* 16  < > 15  < > 17 12 11   CREATININE 1.13*  < > 1.23*  < > 1.32* 1.11*  < > 1.28*  < > 1.32* 1.26* 1.08*  CALCIUM 9.1  < > 9.1  < > 8.8* 9.4  < > 8.3*  < > 8.6* 8.5* 8.4*  MG  --   < > 1.5*  --  1.7  --   --  1.4*  --   --   --   --   TSH 3.760  --   --   --   --  2.76  --   --   --   --   --   --   < > = values in this interval not displayed. Liver Function Tests:  Recent Labs  03/14/16 1608 05/24/16 1106 07/20/16 1903  AST 42* 17 17  ALT 46* 12 13*  ALKPHOS 93 82 79  BILITOT 0.9 0.5 0.4  PROT 6.3 7.1 7.2  ALBUMIN 3.3* 3.6 3.3*    Recent Labs  01/07/16 0921 07/20/16 1903  LIPASE 51 54*   No results for input(s): AMMONIA in the last 8760 hours. CBC:  Recent Labs  03/14/16 1608  07/20/16 2028  08/23/16 0347 08/24/16 0554 08/25/16 0432 08/26/16 0159  WBC 5.2  < > 6.2  < > 12.1* 10.1 7.6 6.5  NEUTROABS 2,912  --  4.2  --  9.7*  --   --   --   HGB 10.1*  < > 11.6*  < > 10.6* 10.6* 10.0* 9.7*  HCT 32.7*  < > 33.4*  < > 31.6* 31.3* 29.4* 28.8*  MCV 69.4*  < > 68.7*  < > 70.2* 70.3* 69.8* 69.7*  PLT 354  < >  285  < > 267 235 213 242  < > = values in this interval not displayed. Lipid Panel:  Recent Labs  10/12/15 0928  CHOL 142  HDL 47  LDLCALC 79  TRIG 82  CHOLHDL 3.0   Lab Results  Component Value Date   HGBA1C 7.0 (H) 05/24/2016    Procedures since last visit: Dg Chest 2 View  Result Date: 08/22/2016 CLINICAL DATA:  Chest pain, shortness of breath which began this morning. History of CHF, hypertension, pulmonary hypertension, colonic malignancy, peripheral vascular disease, and diabetes. EXAM: CHEST  2 VIEW COMPARISON:  PA and lateral chest x-ray of May 01, 2016 FINDINGS: The lungs are mildly hyperinflated with hemidiaphragm flattening. There is no focal infiltrate. The cardiac silhouette is  enlarged. The central pulmonary vascularity is mildly prominent. No pulmonary interstitial edema is observed. There is a small right pleural effusion. There is calcification in the wall of the aortic arch. The bony thorax exhibits no acute abnormality. IMPRESSION: COPD. Cardiomegaly with out definite pulmonary edema. Small right pleural effusion No focal pneumonia. Thoracic aortic atherosclerosis. Electronically Signed   By: David  Martinique M.D.   On: 08/22/2016 15:34   Ct Angio Chest Pe W Or Wo Contrast  Result Date: 08/23/2016 CLINICAL DATA:  Acute chest pain. Substernal chest pain. Worsening short of breath with exertion. Positive D-dimer. EXAM: CT ANGIOGRAPHY CHEST WITH CONTRAST TECHNIQUE: Multidetector CT imaging of the chest was performed using the standard protocol during bolus administration of intravenous contrast. Multiplanar CT image reconstructions and MIPs were obtained to evaluate the vascular anatomy. CONTRAST:  90 cc Isovue 370 COMPARISON:  None. FINDINGS: Cardiovascular: No filling defects within the main pulmonary arteries. Within the posterior RIGHT lower lobe pulmonary artery there an eccentric filling defect (image 157, series 6). There is mild expansion of the artery compare to adjacent vessels (image 156, series 6). Similarly, in the posterior segment of the LEFT lower lobe pulmonary artery, there is eccentric filling defect (image 164, series 6) with expansion of the vessel. Small filling defects more distally the RIGHT LEFT lower lobe pulmonary artery on image 185 series 6. No upper lobe filling defects identified. The ascending thoracic aorta measures 46 mm. Mediastinum/Nodes: No axillary supraclavicular adenopathy. No pericardial fluid. Esophagus normal. Lungs/Pleura: Bilateral small effusions, RIGHT greater than LEFT. Mild bibasilar atelectasis. No pulmonary edema. No pneumonia. Upper Abdomen: Limited view of the liver, kidneys, pancreas are unremarkable. Normal adrenal glands.  Musculoskeletal: No aggressive osseous lesion. Review of the MIP images confirms the above findings. IMPRESSION: 1. Eccentric filling defect within the posterior lower lobe pulmonary artery on the LEFT and RIGHT are most consistent with pulmonary emboli. The findings have some features of subacute pulmonary emboli. 2. Overall clot burden is minimal.  No RIGHT ventricular strain. 3. Bilateral small effusions, RIGHT greater LEFT. 4. Ascending thoracic aorta measures 46 mm. Ascending thoracic aortic aneurysm. Recommend semi-annual imaging followup by CTA or MRA and referral to cardiothoracic surgery if not already obtained. This recommendation follows 2010 ACCF/AHA/AATS/ACR/ASA/SCA/SCAI/SIR/STS/SVM Guidelines for the Diagnosis and Management of Patients With Thoracic Aortic Disease. Circulation. 2010; [id number] Electronically Signed   By: Suzy Bouchard M.D.   On: 08/23/2016 12:58   Nm Pulmonary Perf And Vent  Result Date: 08/22/2016 CLINICAL DATA:  83 y/o F; chest pain and shortness of breath. Positive D-dimer. EXAM: NUCLEAR MEDICINE VENTILATION - PERFUSION LUNG SCAN TECHNIQUE: Ventilation images were obtained in multiple projections using inhaled aerosol Tc-45m DTPA. Perfusion images were obtained in multiple projections after intravenous injection  of Tc-68m MAA. RADIOPHARMACEUTICALS:  31 mCi Technetium-68m DTPA aerosol inhalation and 3.9 mCi Technetium-25m MAA IV COMPARISON:  08/22/2016 chest radiograph. FINDINGS: Ventilation: Diffusely heterogeneous ventilation probably representing airways disease. Perfusion: Large wedge-shaped mismatched ventilation perfusion defect within the right lower lobe. No radiographic correlate. IMPRESSION: 1. Single large V/Q mismatch in the right lower lobe. Intermediate probability of pulmonary embolus. 2. Diffuse heterogeneity of ventilation tracer uptake likely represents airways disease. Electronically Signed   By: Kristine Garbe M.D.   On: 08/22/2016 22:17     Assessment/Plan 1. PE (pulmonary thromboembolism) ([place]) -ongoing, back on anticoagulation now and has completed 10bid of eliquis and now to go to 5bid -given samples and sent to mail order   2. Hyperkalemia - f/u labs now that she's been on lisinopril since discharge and takes potassium with lasix qod - Basic metabolic panel  3. Thoracic aortic aneurysm without rupture ([place]) -no need to continue to monitor this b/c pt and family would not want intervention/major surgery  4. Current use of long term anticoagulation -cont eliquis, need to monitor cbc and renal function on this -high risk for intracranial bleeding  5. Chronic atrial fibrillation ([facility]) -now back on eliquis  -rate controlled with current therapy, cont same  Labs/tests ordered:   Orders Placed This Encounter  Procedures  . Basic metabolic panel    Next appt:  11/06/2016  Anessa Charley L. Jordie Schreur, D.O. [facility] [address], [place] [address] Cell Phone (Mon-Fri 8am-5pm):  [phone] On Call:  [phone] & follow prompts after 5pm & weekends Office Phone:  [phone] Office Fax:  [phone]

## 2016-09-06 ENCOUNTER — Encounter: Payer: Self-pay | Admitting: *Deleted

## 2016-10-06 ENCOUNTER — Other Ambulatory Visit: Payer: Self-pay | Admitting: Internal Medicine

## 2016-10-18 DIAGNOSIS — Z1231 Encounter for screening mammogram for malignant neoplasm of breast: Secondary | ICD-10-CM | POA: Diagnosis not present

## 2016-10-18 LAB — HM MAMMOGRAPHY

## 2016-10-22 ENCOUNTER — Encounter: Payer: Self-pay | Admitting: *Deleted

## 2016-11-02 NOTE — Telephone Encounter (Signed)
Old message disregard

## 2016-11-06 ENCOUNTER — Other Ambulatory Visit: Payer: Medicare Other

## 2016-11-07 ENCOUNTER — Other Ambulatory Visit: Payer: Medicare Other

## 2016-11-07 DIAGNOSIS — I5032 Chronic diastolic (congestive) heart failure: Secondary | ICD-10-CM | POA: Diagnosis not present

## 2016-11-07 DIAGNOSIS — I482 Chronic atrial fibrillation, unspecified: Secondary | ICD-10-CM

## 2016-11-07 DIAGNOSIS — I10 Essential (primary) hypertension: Secondary | ICD-10-CM

## 2016-11-07 DIAGNOSIS — E1121 Type 2 diabetes mellitus with diabetic nephropathy: Secondary | ICD-10-CM

## 2016-11-07 LAB — LIPID PANEL
Cholesterol: 174 mg/dL (ref <200)
HDL: 47 mg/dL — ABNORMAL LOW (ref >50)
LDL Cholesterol: 109 mg/dL — ABNORMAL HIGH (ref <100)
Total CHOL/HDL Ratio: 3.7 Ratio (ref <5.0)
Triglycerides: 89 mg/dL (ref <150)
VLDL: 18 mg/dL (ref <30)

## 2016-11-07 LAB — COMPLETE METABOLIC PANEL WITH GFR
ALT: 10 U/L (ref 6–29)
AST: 17 U/L (ref 10–35)
Albumin: 3.6 g/dL (ref 3.6–5.1)
Alkaline Phosphatase: 107 U/L (ref 33–130)
BUN: 16 mg/dL (ref 7–25)
CO2: 26 mmol/L (ref 20–31)
Calcium: 9.1 mg/dL (ref 8.6–10.4)
Chloride: 104 mmol/L (ref 98–110)
Creat: 1.23 mg/dL — ABNORMAL HIGH (ref 0.60–0.88)
GFR, Est African American: 47 mL/min — ABNORMAL LOW (ref >=60)
GFR, Est Non African American: 41 mL/min — ABNORMAL LOW (ref >=60)
Glucose, Bld: 141 mg/dL — ABNORMAL HIGH (ref 65–99)
Potassium: 3.5 mmol/L (ref 3.5–5.3)
Sodium: 142 mmol/L (ref 135–146)
Total Bilirubin: 1 mg/dL (ref 0.2–1.2)
Total Protein: 7.6 g/dL (ref 6.1–8.1)

## 2016-11-08 ENCOUNTER — Ambulatory Visit (INDEPENDENT_AMBULATORY_CARE_PROVIDER_SITE_OTHER): Payer: Medicare Other | Admitting: Internal Medicine

## 2016-11-08 ENCOUNTER — Encounter: Payer: Self-pay | Admitting: *Deleted

## 2016-11-08 ENCOUNTER — Encounter: Payer: Self-pay | Admitting: Internal Medicine

## 2016-11-08 VITALS — BP 148/70 | HR 95 | Temp 97.4°F | Wt 152.0 lb

## 2016-11-08 DIAGNOSIS — I482 Chronic atrial fibrillation, unspecified: Secondary | ICD-10-CM

## 2016-11-08 DIAGNOSIS — I10 Essential (primary) hypertension: Secondary | ICD-10-CM | POA: Diagnosis not present

## 2016-11-08 DIAGNOSIS — I2699 Other pulmonary embolism without acute cor pulmonale: Secondary | ICD-10-CM

## 2016-11-08 DIAGNOSIS — F0150 Vascular dementia without behavioral disturbance: Secondary | ICD-10-CM

## 2016-11-08 DIAGNOSIS — E1121 Type 2 diabetes mellitus with diabetic nephropathy: Secondary | ICD-10-CM | POA: Diagnosis not present

## 2016-11-08 DIAGNOSIS — Z7901 Long term (current) use of anticoagulants: Secondary | ICD-10-CM

## 2016-11-08 DIAGNOSIS — I5032 Chronic diastolic (congestive) heart failure: Secondary | ICD-10-CM

## 2016-11-08 LAB — CBC WITH DIFFERENTIAL/PLATELET
Basophils Absolute: 0 cells/uL (ref 0–200)
Basophils Relative: 0 %
Eosinophils Absolute: 126 cells/uL (ref 15–500)
Eosinophils Relative: 2 %
HCT: 39 % (ref 35.0–45.0)
Hemoglobin: 12.1 g/dL (ref 11.7–15.5)
Lymphocytes Relative: 26 %
Lymphs Abs: 1638 cells/uL (ref 850–3900)
MCH: 23.3 pg — ABNORMAL LOW (ref 27.0–33.0)
MCHC: 31 g/dL — ABNORMAL LOW (ref 32.0–36.0)
MCV: 75 fL — ABNORMAL LOW (ref 80.0–100.0)
Monocytes Absolute: 630 cells/uL (ref 200–950)
Monocytes Relative: 10 %
Neutro Abs: 3906 cells/uL (ref 1500–7800)
Neutrophils Relative %: 62 %
Platelets: 379 10*3/uL (ref 140–400)
RBC: 5.2 MIL/uL — ABNORMAL HIGH (ref 3.80–5.10)
RDW: 19.2 % — ABNORMAL HIGH (ref 11.0–15.0)
WBC: 6.3 10*3/uL (ref 3.8–10.8)

## 2016-11-08 LAB — HEMOGLOBIN A1C
Hgb A1c MFr Bld: 6.9 % — ABNORMAL HIGH (ref <5.7)
Mean Plasma Glucose: 151 mg/dL

## 2016-11-08 NOTE — Progress Notes (Signed)
Location:  [facility] clinic Provider:  Kathalene Sporer L. Mariea Clonts, D.O., C.M.D.  Code Status: DNR Goals of Care:  Advanced Directives 11/08/2016  Does Patient Have a Medical Advance Directive? Yes  Type of Paramedic of [place];Living will  Does patient want to make changes to medical advance directive? -  Copy of [place] in Chart? Yes  Would patient like information on creating a medical advance directive? -  Pre-existing out of facility DNR order (yellow form or pink MOST form) -     Chief Complaint  Patient presents with  . Medical Management of Chronic Issues    52mth follow-up    HPI: Patient is a 83 y.o. female seen today for medical management of chronic diseases.    Pleuritic chest pain has resolved from PE.  Still on eliquis and renal function actually improving.  Blood counts also better.  Lost 8 lbs since last appt.  Is eating well.  Still feels weak.   Is sometimes feeling weak off and on more often than a couple of months ago.    LDL 109.  Off statins due to weakness, frailty and age plus dementia.  vascular dementia:  Forgets more--seems to come and go.  Caregiver with her 5 hrs.  She is more independent but still has someone with her 24x7.    CHF:  No shortness of breath or chest pain.  Weight down.    Past Medical History:  Diagnosis Date  . AAA (abdominal aortic aneurysm) ([place])   . Acute upper respiratory infections of unspecified site   . Anemia   . Anxiety   . Aortic aneurysm of unspecified site without mention of rupture   . Arthritis   . Atrial fibrillation ([place])   . Cervicalgia   . Cholelithiasis   . Chronic systolic heart failure ([place])   . CKD (chronic kidney disease)   . Colon cancer ([place]) 07/1997  . Depression   . Diabetes mellitus ([place])   . Dizziness and giddiness   . Electrolyte and fluid disorders not elsewhere classified   . GERD (gastroesophageal reflux disease)   . Gout, unspecified   . Hemorrhage of  rectum and anus   . Hypercholesteremia   . Hypertension   . Hypoglycemia, unspecified   . Hypopotassemia   . Hypothyroidism   . Kidney stone    renal calculi  . Malignant neoplasm of colon, unspecified site   . Other chronic allergic conjunctivitis   . Other specified cardiac dysrhythmias(427.89)   . Pain in joint, lower leg   . Perforation of bile duct   . Proteinuria   . Shortness of breath   . Small bowel obstruction due to adhesions 05/16/2012  . Stroke ([place])   . Swelling, mass, or lump in head and neck   . Thoracic aneurysm without mention of rupture    4.6 cm Asc Aortic aneurysm, CT 07/2015    Past Surgical History:  Procedure Laterality Date  . CARDIAC CATHETERIZATION  2008   moderate severe pulm HTN; with elevted pulm capillary wedge pressure   . CHOLECYSTECTOMY  05/14/2012   Procedure: LAPAROSCOPIC CHOLECYSTECTOMY WITH INTRAOPERATIVE CHOLANGIOGRAM;  Surgeon: Haywood Lasso, MD;  Location: [place];  Service: General;  Laterality: N/A;  . COLON SURGERY     to remove colon ca  . ERCP  05/17/2012   Procedure: ENDOSCOPIC RETROGRADE CHOLANGIOPANCREATOGRAPHY (ERCP);  Surgeon: Missy Sabins, MD;  Location: [place];  Service: Gastroenterology;  Laterality: N/A;  . ERCP  08/12/2012   Procedure: ENDOSCOPIC RETROGRADE CHOLANGIOPANCREATOGRAPHY (ERCP);  Surgeon: Missy Sabins, MD;  Location: [facility] ENDOSCOPY;  Service: Endoscopy;  Laterality: N/A;  . HEMICOLECTOMY  08/11/1997  . KIDNEY STONE SURGERY    . TRANSTHORACIC ECHOCARDIOGRAM  11/2009   EF 45-50%, mild-mod AV regurg; mild MR; mod TR; ascending aorta mildly dilated    Allergies  Allergen Reactions  . Iohexol Nausea And Vomiting  . Iodinated Diagnostic Agents Nausea And Vomiting  . Z-Pak [Azithromycin] Other (See Comments)    Unknown allergic reaction    Allergies as of 11/08/2016      Reactions   Iohexol Nausea And Vomiting   Iodinated Diagnostic Agents Nausea And Vomiting   Z-pak [azithromycin] Other (See Comments)    Unknown allergic reaction      Medication List       Accurate as of 11/08/16 10:32 AM. Always use your most recent med list.          allopurinol 100 MG tablet Commonly known as:  ZYLOPRIM Take 100 mg by mouth daily.   apixaban 5 MG Tabs tablet Commonly known as:  ELIQUIS Take 1 tablet (5 mg total) by mouth 2 (two) times daily.   cloNIDine 0.2 MG tablet Commonly known as:  CATAPRES take 1 tablet by mouth three times a day for hypertension   donepezil 10 MG tablet Commonly known as:  ARICEPT Take 1 tablet (10 mg total) by mouth at bedtime.   furosemide 20 MG tablet Commonly known as:  LASIX Take 1 tablet (20 mg) by mouth every other day, may also take one additional tablet daily for weight gain greater than 3 lbs.   hydrALAZINE 50 MG tablet Commonly known as:  APRESOLINE Take 1 tablet (50 mg total) by mouth 3 (three) times daily.   isosorbide mononitrate 30 MG 24 hr tablet Commonly known as:  IMDUR TAKE 1 TABLET BY MOUTH  DAILY   levothyroxine 50 MCG tablet Commonly known as:  SYNTHROID, LEVOTHROID TAKE 1 TABLET BY MOUTH  DAILY BEFORE BREAKFAST   lisinopril 5 MG tablet Commonly known as:  PRINIVIL,ZESTRIL Take 1 tablet (5 mg total) by mouth daily.   metoprolol 100 MG tablet Commonly known as:  LOPRESSOR Take 1 tablet (100 mg total) by mouth 2 (two) times daily.   polyethylene glycol packet Commonly known as:  MIRALAX / GLYCOLAX Take 17 g by mouth daily.   potassium chloride 10 MEQ tablet Commonly known as:  K-DUR Take 1 tablet (10 mEq total) by mouth every other day.       Review of Systems:  Review of Systems  Constitutional: Negative for chills, fever and malaise/fatigue.  HENT: Negative for congestion and hearing loss.   Eyes: Negative for blurred vision.       Due for eye exam  Respiratory: Negative for cough and shortness of breath.   Cardiovascular: Negative for chest pain, palpitations and leg swelling.  Gastrointestinal: Negative for  abdominal pain and constipation.  Genitourinary: Negative for dysuria.  Musculoskeletal: Negative for falls and myalgias.  Skin: Negative for itching and rash.  Neurological: Negative for dizziness, loss of consciousness and weakness.  Endo/Heme/Allergies: Does not bruise/bleed easily.  Psychiatric/Behavioral: Positive for memory loss.    Health Maintenance  Topic Date Due  . OPHTHALMOLOGY EXAM  03/22/1941  . INFLUENZA VACCINE  02/20/2017  . HEMOGLOBIN A1C  05/09/2017  . FOOT EXAM  05/24/2017  . MAMMOGRAM  10/18/2017  . TETANUS/TDAP  09/16/2021  . DEXA SCAN  Completed  . PNA  vac Low Risk Adult  Completed    Physical Exam: Vitals:   11/08/16 1026  BP: (!) 148/70  Pulse: 95  Temp: 97.4 F (36.3 C)  TempSrc: Oral  SpO2: 94%  Weight: 152 lb (68.9 kg)   Body mass index is 23.11 kg/m. Physical Exam  Constitutional: She appears well-developed and well-nourished. No distress.  Cardiovascular: Intact distal pulses.   irreg irreg  Pulmonary/Chest: Effort normal and breath sounds normal. No respiratory distress. She has no rales.  Abdominal: Bowel sounds are normal.  Musculoskeletal: Normal range of motion.  Neurological: She is alert.  Oriented to person and place, not time, poor historian; ambulates with cane, comes with her caregiver who is there 5 hrs per day  Skin: Skin is warm and dry.  Psychiatric: She has a normal mood and affect.    Labs reviewed: Basic Metabolic Panel:  Recent Labs  01/08/16 0508  02/17/16 1259 03/14/16 1608  04/11/16 1002  08/26/16 0159 09/03/16 1536 11/07/16 0856  NA 138  < > 138 139  < > 139  < > 138 139 142  K 3.3*  < > 4.1 4.5  < > 3.8  < > 3.9 4.5 3.5  CL 99*  < > 105 106  < > 104  < > 109 106 104  CO2 29  < > 26 23  < > 29  < > 24 26 26   GLUCOSE 144*  < > 147* 100*  < > 128*  < > 109* 226* 141*  BUN 18  < > 21* 16  < > 15  < > 11 18 16   CREATININE 1.23*  < > 1.32* 1.11*  < > 1.28*  < > 1.08* 1.44* 1.23*  CALCIUM 9.1  < > 8.8*  9.4  < > 8.3*  < > 8.4* 8.7 9.1  MG 1.5*  --  1.7  --   --  1.4*  --   --   --   --   TSH  --   --   --  2.76  --   --   --   --   --   --   < > = values in this interval not displayed. Liver Function Tests:  Recent Labs  05/24/16 1106 07/20/16 1903 11/07/16 0856  AST 17 17 17   ALT 12 13* 10  ALKPHOS 82 79 107  BILITOT 0.5 0.4 1.0  PROT 7.1 7.2 7.6  ALBUMIN 3.6 3.3* 3.6    Recent Labs  01/07/16 0921 07/20/16 1903  LIPASE 51 54*   No results for input(s): AMMONIA in the last 8760 hours. CBC:  Recent Labs  07/20/16 2028  08/23/16 0347  08/25/16 0432 08/26/16 0159 11/07/16 0856  WBC 6.2  < > 12.1*  < > 7.6 6.5 6.3  NEUTROABS 4.2  --  9.7*  --   --   --  3,906  HGB 11.6*  < > 10.6*  < > 10.0* 9.7* 12.1  HCT 33.4*  < > 31.6*  < > 29.4* 28.8* 39.0  MCV 68.7*  < > 70.2*  < > 69.8* 69.7* 75.0*  PLT 285  < > 267  < > 213 242 379  < > = values in this interval not displayed. Lipid Panel:  Recent Labs  11/07/16 0856  CHOL 174  HDL 47*  LDLCALC 109*  TRIG 89  CHOLHDL 3.7   Lab Results  Component Value Date   HGBA1C 6.9 (H) 11/07/2016    Assessment/Plan  1. PE (pulmonary thromboembolism) ([place]) -pain resolved -continues on eliquis -renal function and blood counts good  2. Current use of long term anticoagulation -cont eliquis  3. Chronic atrial fibrillation (HCC) -rate controlled, cont eliquis, lopressor  4. Type 2 diabetes mellitus with diabetic nephropathy, without long-term current use of insulin (HCC) -not on meds, hba1c has been great, cont to monitor  5. Chronic diastolic congestive heart failure (H[facility]) -weight down 8 lbs since last visit 2 mos ago -renal function stable  -con lasix and potassium, ace, beta blocker  6. Essential hypertension -bp well controlled with hydralazine, lisinopril, lopressor, imdur, lasix -no dizziness recently  7. Vascular dementia without behavioral disturbance -memory worsening, needs a wellness so she actually gets  MMSE done to reassess -cont aricept, may need namenda added  Labs/tests ordered:  Will check labs at next appt nonfasting Next appt:  3 mos annual wellness with Clarise Cruz, CPE with me--they left w/o making appt  Jeimy Bickert L. Robertlee Rogacki, D.O. [facility] [address], [place] [address] Cell Phone (Mon-Fri 8am-5pm):  [phone] On Call:  [phone] & follow prompts after 5pm & weekends Office Phone:  [phone] Office Fax:  [phone]

## 2016-11-17 ENCOUNTER — Other Ambulatory Visit: Payer: Self-pay | Admitting: Internal Medicine

## 2016-11-17 DIAGNOSIS — F0150 Vascular dementia without behavioral disturbance: Secondary | ICD-10-CM

## 2016-12-22 ENCOUNTER — Other Ambulatory Visit: Payer: Self-pay | Admitting: Internal Medicine

## 2017-01-11 ENCOUNTER — Encounter: Payer: Self-pay | Admitting: Nurse Practitioner

## 2017-01-11 ENCOUNTER — Observation Stay (HOSPITAL_COMMUNITY): Payer: Medicare Other

## 2017-01-11 ENCOUNTER — Ambulatory Visit (INDEPENDENT_AMBULATORY_CARE_PROVIDER_SITE_OTHER): Payer: Medicare Other | Admitting: Nurse Practitioner

## 2017-01-11 ENCOUNTER — Inpatient Hospital Stay (HOSPITAL_COMMUNITY)
Admission: EM | Admit: 2017-01-11 | Discharge: 2017-01-18 | DRG: 545 | Disposition: A | Discharge status: Home Health | Payer: Medicare Other | Attending: Internal Medicine | Admitting: Internal Medicine

## 2017-01-11 ENCOUNTER — Emergency Department (HOSPITAL_COMMUNITY): Payer: Medicare Other

## 2017-01-11 ENCOUNTER — Encounter (HOSPITAL_COMMUNITY): Payer: Self-pay | Admitting: *Deleted

## 2017-01-11 VITALS — BP 188/88 | HR 72 | Temp 97.4°F | Ht 68.0 in | Wt 146.2 lb

## 2017-01-11 DIAGNOSIS — R58 Hemorrhage, not elsewhere classified: Secondary | ICD-10-CM

## 2017-01-11 DIAGNOSIS — E876 Hypokalemia: Secondary | ICD-10-CM | POA: Diagnosis present

## 2017-01-11 DIAGNOSIS — R001 Bradycardia, unspecified: Secondary | ICD-10-CM | POA: Diagnosis not present

## 2017-01-11 DIAGNOSIS — I616 Nontraumatic intracerebral hemorrhage, multiple localized: Secondary | ICD-10-CM | POA: Diagnosis present

## 2017-01-11 DIAGNOSIS — K219 Gastro-esophageal reflux disease without esophagitis: Secondary | ICD-10-CM | POA: Diagnosis present

## 2017-01-11 DIAGNOSIS — I5022 Chronic systolic (congestive) heart failure: Secondary | ICD-10-CM | POA: Diagnosis present

## 2017-01-11 DIAGNOSIS — N183 Chronic kidney disease, stage 3 (moderate): Secondary | ICD-10-CM | POA: Diagnosis not present

## 2017-01-11 DIAGNOSIS — D649 Anemia, unspecified: Secondary | ICD-10-CM | POA: Diagnosis not present

## 2017-01-11 DIAGNOSIS — Z79899 Other long term (current) drug therapy: Secondary | ICD-10-CM

## 2017-01-11 DIAGNOSIS — I619 Nontraumatic intracerebral hemorrhage, unspecified: Secondary | ICD-10-CM | POA: Diagnosis present

## 2017-01-11 DIAGNOSIS — F0150 Vascular dementia without behavioral disturbance: Secondary | ICD-10-CM | POA: Diagnosis present

## 2017-01-11 DIAGNOSIS — I2699 Other pulmonary embolism without acute cor pulmonale: Secondary | ICD-10-CM | POA: Diagnosis not present

## 2017-01-11 DIAGNOSIS — M109 Gout, unspecified: Secondary | ICD-10-CM | POA: Diagnosis present

## 2017-01-11 DIAGNOSIS — R0602 Shortness of breath: Secondary | ICD-10-CM

## 2017-01-11 DIAGNOSIS — R079 Chest pain, unspecified: Secondary | ICD-10-CM | POA: Diagnosis not present

## 2017-01-11 DIAGNOSIS — I613 Nontraumatic intracerebral hemorrhage in brain stem: Secondary | ICD-10-CM

## 2017-01-11 DIAGNOSIS — Z515 Encounter for palliative care: Secondary | ICD-10-CM | POA: Diagnosis not present

## 2017-01-11 DIAGNOSIS — D509 Iron deficiency anemia, unspecified: Secondary | ICD-10-CM | POA: Diagnosis present

## 2017-01-11 DIAGNOSIS — E871 Hypo-osmolality and hyponatremia: Secondary | ICD-10-CM | POA: Diagnosis not present

## 2017-01-11 DIAGNOSIS — E1142 Type 2 diabetes mellitus with diabetic polyneuropathy: Secondary | ICD-10-CM | POA: Diagnosis present

## 2017-01-11 DIAGNOSIS — I498 Other specified cardiac arrhythmias: Secondary | ICD-10-CM | POA: Diagnosis not present

## 2017-01-11 DIAGNOSIS — E854 Organ-limited amyloidosis: Principal | ICD-10-CM | POA: Diagnosis present

## 2017-01-11 DIAGNOSIS — I712 Thoracic aortic aneurysm, without rupture, unspecified: Secondary | ICD-10-CM | POA: Diagnosis present

## 2017-01-11 DIAGNOSIS — Z86711 Personal history of pulmonary embolism: Secondary | ICD-10-CM

## 2017-01-11 DIAGNOSIS — Z8673 Personal history of transient ischemic attack (TIA), and cerebral infarction without residual deficits: Secondary | ICD-10-CM

## 2017-01-11 DIAGNOSIS — E1122 Type 2 diabetes mellitus with diabetic chronic kidney disease: Secondary | ICD-10-CM | POA: Diagnosis present

## 2017-01-11 DIAGNOSIS — R531 Weakness: Secondary | ICD-10-CM

## 2017-01-11 DIAGNOSIS — Z85038 Personal history of other malignant neoplasm of large intestine: Secondary | ICD-10-CM

## 2017-01-11 DIAGNOSIS — E034 Atrophy of thyroid (acquired): Secondary | ICD-10-CM | POA: Diagnosis not present

## 2017-01-11 DIAGNOSIS — I482 Chronic atrial fibrillation, unspecified: Secondary | ICD-10-CM | POA: Diagnosis present

## 2017-01-11 DIAGNOSIS — I680 Cerebral amyloid angiopathy: Secondary | ICD-10-CM | POA: Diagnosis not present

## 2017-01-11 DIAGNOSIS — I610 Nontraumatic intracerebral hemorrhage in hemisphere, subcortical: Secondary | ICD-10-CM | POA: Diagnosis not present

## 2017-01-11 DIAGNOSIS — I130 Hypertensive heart and chronic kidney disease with heart failure and stage 1 through stage 4 chronic kidney disease, or unspecified chronic kidney disease: Secondary | ICD-10-CM | POA: Diagnosis present

## 2017-01-11 DIAGNOSIS — E1151 Type 2 diabetes mellitus with diabetic peripheral angiopathy without gangrene: Secondary | ICD-10-CM | POA: Diagnosis present

## 2017-01-11 DIAGNOSIS — I447 Left bundle-branch block, unspecified: Secondary | ICD-10-CM | POA: Diagnosis present

## 2017-01-11 DIAGNOSIS — I160 Hypertensive urgency: Secondary | ICD-10-CM | POA: Diagnosis not present

## 2017-01-11 DIAGNOSIS — F419 Anxiety disorder, unspecified: Secondary | ICD-10-CM | POA: Diagnosis present

## 2017-01-11 DIAGNOSIS — E039 Hypothyroidism, unspecified: Secondary | ICD-10-CM | POA: Diagnosis present

## 2017-01-11 DIAGNOSIS — Z7901 Long term (current) use of anticoagulants: Secondary | ICD-10-CM

## 2017-01-11 DIAGNOSIS — Z66 Do not resuscitate: Secondary | ICD-10-CM

## 2017-01-11 DIAGNOSIS — I10 Essential (primary) hypertension: Secondary | ICD-10-CM

## 2017-01-11 DIAGNOSIS — I161 Hypertensive emergency: Secondary | ICD-10-CM | POA: Diagnosis present

## 2017-01-11 DIAGNOSIS — E7800 Pure hypercholesterolemia, unspecified: Secondary | ICD-10-CM | POA: Diagnosis present

## 2017-01-11 DIAGNOSIS — Z8679 Personal history of other diseases of the circulatory system: Secondary | ICD-10-CM | POA: Diagnosis not present

## 2017-01-11 DIAGNOSIS — R0603 Acute respiratory distress: Secondary | ICD-10-CM | POA: Diagnosis present

## 2017-01-11 DIAGNOSIS — F329 Major depressive disorder, single episode, unspecified: Secondary | ICD-10-CM | POA: Diagnosis present

## 2017-01-11 LAB — CBC
HCT: 33.9 % — ABNORMAL LOW (ref 36.0–46.0)
Hemoglobin: 11.6 g/dL — ABNORMAL LOW (ref 12.0–15.0)
MCH: 23.6 pg — ABNORMAL LOW (ref 26.0–34.0)
MCHC: 34.2 g/dL (ref 30.0–36.0)
MCV: 68.9 fL — ABNORMAL LOW (ref 78.0–100.0)
PLATELETS: 234 10*3/uL (ref 150–400)
RBC: 4.92 MIL/uL (ref 3.87–5.11)
RDW: 18 % — ABNORMAL HIGH (ref 11.5–15.5)
WBC: 5.8 10*3/uL (ref 4.0–10.5)

## 2017-01-11 LAB — URINALYSIS, ROUTINE W REFLEX MICROSCOPIC
Bilirubin Urine: NEGATIVE
GLUCOSE, UA: NEGATIVE mg/dL
HGB URINE DIPSTICK: NEGATIVE
Ketones, ur: NEGATIVE mg/dL
Leukocytes, UA: NEGATIVE
Nitrite: NEGATIVE
PROTEIN: 100 mg/dL — AB
SPECIFIC GRAVITY, URINE: 1.029 (ref 1.005–1.030)
pH: 6 (ref 5.0–8.0)

## 2017-01-11 LAB — TROPONIN I

## 2017-01-11 LAB — I-STAT CG4 LACTIC ACID, ED
LACTIC ACID, VENOUS: 1.49 mmol/L (ref 0.5–1.9)
LACTIC ACID, VENOUS: 1.27 mmol/L (ref 0.5–1.9)

## 2017-01-11 LAB — I-STAT TROPONIN, ED
TROPONIN I, POC: 0.01 ng/mL (ref 0.00–0.08)
Troponin i, poc: 0.01 ng/mL (ref 0.00–0.08)

## 2017-01-11 LAB — BASIC METABOLIC PANEL
ANION GAP: 9 (ref 5–15)
BUN: 12 mg/dL (ref 6–20)
CALCIUM: 9 mg/dL (ref 8.9–10.3)
CO2: 26 mmol/L (ref 22–32)
CREATININE: 1.07 mg/dL — AB (ref 0.44–1.00)
Chloride: 104 mmol/L (ref 101–111)
GFR, EST AFRICAN AMERICAN: 54 mL/min — AB (ref >60)
GFR, EST NON AFRICAN AMERICAN: 47 mL/min — AB (ref >60)
Glucose, Bld: 127 mg/dL — ABNORMAL HIGH (ref 65–99)
Potassium: 3.4 mmol/L — ABNORMAL LOW (ref 3.5–5.1)
SODIUM: 139 mmol/L (ref 135–145)

## 2017-01-11 LAB — AMMONIA: Ammonia: 24 umol/L (ref 9–35)

## 2017-01-11 LAB — CBG MONITORING, ED: GLUCOSE-CAPILLARY: 94 mg/dL (ref 65–99)

## 2017-01-11 LAB — TSH: TSH: 0.244 u[IU]/mL — AB (ref 0.350–4.500)

## 2017-01-11 LAB — GLUCOSE, CAPILLARY: Glucose-Capillary: 98 mg/dL (ref 65–99)

## 2017-01-11 MED ORDER — IPRATROPIUM-ALBUTEROL 0.5-2.5 (3) MG/3ML IN SOLN
3.0000 mL | Freq: Once | RESPIRATORY_TRACT | Status: AC
  Administered 2017-01-11: 3 mL via RESPIRATORY_TRACT
  Filled 2017-01-11: qty 3

## 2017-01-11 MED ORDER — ALLOPURINOL 100 MG PO TABS
100.0000 mg | ORAL_TABLET | Freq: Every day | ORAL | Status: DC
  Administered 2017-01-12 – 2017-01-18 (×7): 100 mg via ORAL
  Filled 2017-01-11 (×7): qty 1

## 2017-01-11 MED ORDER — ONDANSETRON HCL 4 MG PO TABS: 4.0000 mg | ORAL_TABLET | Freq: Four times a day (QID) | ORAL | Status: DC | PRN

## 2017-01-11 MED ORDER — ACETAMINOPHEN 325 MG PO TABS: 650.0000 mg | ORAL_TABLET | Freq: Four times a day (QID) | ORAL | Status: DC | PRN

## 2017-01-11 MED ORDER — HYDRALAZINE HCL 20 MG/ML IJ SOLN
5.0000 mg | INTRAMUSCULAR | Status: DC | PRN
  Administered 2017-01-12: 10 mg via INTRAVENOUS
  Filled 2017-01-11: qty 1

## 2017-01-11 MED ORDER — POLYETHYLENE GLYCOL 3350 17 G PO PACK: 17.0000 g | PACK | Freq: Every day | ORAL | Status: DC | PRN

## 2017-01-11 MED ORDER — HYDROCODONE-ACETAMINOPHEN 5-325 MG PO TABS
1.0000 | ORAL_TABLET | ORAL | Status: DC | PRN
  Administered 2017-01-17: 1 via ORAL
  Filled 2017-01-11: qty 1

## 2017-01-11 MED ORDER — DEXTROSE 5 % IV SOLN
20.0000 meq | Freq: Once | INTRAVENOUS | Status: AC
  Administered 2017-01-11: 20 meq via INTRAVENOUS
  Filled 2017-01-11: qty 4.55

## 2017-01-11 MED ORDER — IOPAMIDOL (ISOVUE-370) INJECTION 76%
INTRAVENOUS | Status: AC
  Administered 2017-01-11: 100 mL
  Filled 2017-01-11: qty 100

## 2017-01-11 MED ORDER — ALBUTEROL SULFATE (2.5 MG/3ML) 0.083% IN NEBU: 2.5000 mg | INHALATION_SOLUTION | RESPIRATORY_TRACT | Status: DC | PRN

## 2017-01-11 MED ORDER — METOCLOPRAMIDE HCL 5 MG/ML IJ SOLN
10.0000 mg | Freq: Once | INTRAMUSCULAR | Status: AC
  Administered 2017-01-11: 10 mg via INTRAVENOUS
  Filled 2017-01-11: qty 2

## 2017-01-11 MED ORDER — METOPROLOL TARTRATE 25 MG PO TABS
50.0000 mg | ORAL_TABLET | Freq: Two times a day (BID) | ORAL | Status: DC
  Administered 2017-01-12 – 2017-01-13 (×4): 50 mg via ORAL
  Filled 2017-01-11 (×4): qty 2

## 2017-01-11 MED ORDER — HYDRALAZINE HCL 25 MG PO TABS
50.0000 mg | ORAL_TABLET | Freq: Two times a day (BID) | ORAL | Status: DC
  Administered 2017-01-11 – 2017-01-12 (×3): 50 mg via ORAL
  Filled 2017-01-11 (×3): qty 2

## 2017-01-11 MED ORDER — ONDANSETRON HCL 4 MG/2ML IJ SOLN: 4.0000 mg | Freq: Four times a day (QID) | INTRAMUSCULAR | Status: DC | PRN

## 2017-01-11 MED ORDER — APIXABAN 5 MG PO TABS
5.0000 mg | ORAL_TABLET | Freq: Two times a day (BID) | ORAL | Status: DC
  Administered 2017-01-11: 5 mg via ORAL
  Filled 2017-01-11: qty 1

## 2017-01-11 MED ORDER — SODIUM CHLORIDE 0.9% FLUSH
3.0000 mL | Freq: Two times a day (BID) | INTRAVENOUS | Status: DC
  Administered 2017-01-11 – 2017-01-15 (×8): 3 mL via INTRAVENOUS
  Administered 2017-01-15: 6 mL via INTRAVENOUS
  Administered 2017-01-16 – 2017-01-18 (×5): 3 mL via INTRAVENOUS

## 2017-01-11 MED ORDER — SODIUM CHLORIDE 0.9 % IV BOLUS (SEPSIS)
500.0000 mL | Freq: Once | INTRAVENOUS | Status: AC
  Administered 2017-01-11: 500 mL via INTRAVENOUS

## 2017-01-11 MED ORDER — DONEPEZIL HCL 10 MG PO TABS
10.0000 mg | ORAL_TABLET | Freq: Every day | ORAL | Status: DC
  Administered 2017-01-12 – 2017-01-17 (×6): 10 mg via ORAL
  Filled 2017-01-11 (×6): qty 1

## 2017-01-11 MED ORDER — CLONIDINE HCL 0.2 MG PO TABS
0.2000 mg | ORAL_TABLET | Freq: Three times a day (TID) | ORAL | Status: DC
  Administered 2017-01-11 – 2017-01-14 (×7): 0.2 mg via ORAL
  Filled 2017-01-11 (×7): qty 1

## 2017-01-11 MED ORDER — MAGNESIUM SULFATE IN D5W 1-5 GM/100ML-% IV SOLN
1.0000 g | Freq: Once | INTRAVENOUS | Status: AC
  Administered 2017-01-11: 1 g via INTRAVENOUS
  Filled 2017-01-11: qty 100

## 2017-01-11 MED ORDER — DIPHENHYDRAMINE HCL 50 MG/ML IJ SOLN
25.0000 mg | Freq: Once | INTRAMUSCULAR | Status: AC
  Administered 2017-01-11: 25 mg via INTRAVENOUS
  Filled 2017-01-11: qty 1

## 2017-01-11 MED ORDER — ACETAMINOPHEN 650 MG RE SUPP: 650.0000 mg | Freq: Four times a day (QID) | RECTAL | Status: DC | PRN

## 2017-01-11 MED ORDER — ISOSORBIDE MONONITRATE ER 30 MG PO TB24
30.0000 mg | ORAL_TABLET | Freq: Every day | ORAL | Status: DC
  Administered 2017-01-12 – 2017-01-15 (×4): 30 mg via ORAL
  Filled 2017-01-11 (×4): qty 1

## 2017-01-11 MED ORDER — SODIUM CHLORIDE 0.9 % IV SOLN
INTRAVENOUS | Status: AC
  Administered 2017-01-11: 75 mL/h via INTRAVENOUS

## 2017-01-11 MED ORDER — BISACODYL 5 MG PO TBEC
5.0000 mg | DELAYED_RELEASE_TABLET | Freq: Every day | ORAL | Status: DC | PRN
  Administered 2017-01-17: 5 mg via ORAL
  Filled 2017-01-11: qty 1

## 2017-01-11 MED ORDER — LEVOTHYROXINE SODIUM 50 MCG PO TABS
50.0000 ug | ORAL_TABLET | Freq: Every day | ORAL | Status: DC
  Administered 2017-01-12 – 2017-01-18 (×8): 50 ug via ORAL
  Filled 2017-01-11 (×8): qty 1

## 2017-01-11 MED ORDER — LISINOPRIL 10 MG PO TABS
5.0000 mg | ORAL_TABLET | Freq: Every day | ORAL | Status: DC
  Administered 2017-01-12: 5 mg via ORAL
  Filled 2017-01-11: qty 1

## 2017-01-11 NOTE — ED Provider Notes (Signed)
[facility] DEPT Provider Note   CSN: [id number] Arrival date & time: 01/11/17  1350     History   Chief Complaint Chief Complaint  Patient presents with  . Shortness of Breath    HPI Nicole [name] is a 83 y.o. female.   Pt with hx AAA, Afib on eliquis, CKD, systolic heart failure, DM, PE, vascular dementia sent by PCP for SOB, tachypnea, chest pain, generalized weakness, fatigue that began this morning.  Family member reports coughing starting today.  Pt endorses SOB but denies any pain currently.  She is reportedly at her baseline mental status per son.  Pt lives at home with significant other and a caregive (10a-2p) who manage her medications.  She has a goddaughter who is her power of attorney.  She does not have any living will or advanced directives.    Level V caveat for dementia    HPI  Past Medical History:  Diagnosis Date  . AAA (abdominal aortic aneurysm) ([place])   . Acute upper respiratory infections of unspecified site   . Anemia   . Anxiety   . Aortic aneurysm of unspecified site without mention of rupture   . Arthritis   . Atrial fibrillation ([place])   . Cervicalgia   . Cholelithiasis   . Chronic systolic heart failure ([place])   . CKD (chronic kidney disease)   . Colon cancer ([place]) 07/1997  . Depression   . Diabetes mellitus ([place])   . Dizziness and giddiness   . Electrolyte and fluid disorders not elsewhere classified   . GERD (gastroesophageal reflux disease)   . Gout, unspecified   . Hemorrhage of rectum and anus   . Hypercholesteremia   . Hypertension   . Hypoglycemia, unspecified   . Hypopotassemia   . Hypothyroidism   . Kidney stone    renal calculi  . Malignant neoplasm of colon, unspecified site   . Other chronic allergic conjunctivitis   . Other specified cardiac dysrhythmias(427.89)   . Pain in joint, lower leg   . Perforation of bile duct   . Proteinuria   . Shortness of breath   . Small bowel obstruction due to adhesions ([place])  05/16/2012  . Stroke ([place])   . Swelling, mass, or lump in head and neck   . Thoracic aneurysm without mention of rupture    4.6 cm Asc Aortic aneurysm, CT 07/2015    Patient Active Problem List   Diagnosis Date Noted  . PE (pulmonary thromboembolism) ([place]) 08/24/2016  . Pulmonary embolus ([place])   . Accelerated hypertension with diastolic congestive heart failure, NYHA class 3 ([place]) 01/05/2016  . Rib pain on right side 10/14/2015  . Hypothyroidism due to acquired atrophy of thyroid 10/14/2015  . Type 2 diabetes mellitus with diabetic nephropathy, without long-term current use of insulin ([place]) 10/14/2015  . Acute on chronic systolic heart failure ([place]) 08/02/2015  . Acute on chronic diastolic heart failure ([place]) 08/02/2015  . Bradycardia 07/27/2015  . Chronic anemia 07/27/2015  . Chest pain   . Vascular dementia 06/13/2015  . Memory loss 06/13/2015  . Left hemiparesis ([place])   . Persistent atrial fibrillation ([place])   . Type 2 diabetes mellitus with peripheral neuropathy (H[facility])   . ICH (intracerebral hemorrhage) ([place]) 04/04/2015  . Chronic atrial fibrillation ([place])   . Hypertensive urgency   . SOB (shortness of breath) 04/03/2014  . Diabetic nephropathy ([place]) 09/07/2013  . Chronic constipation 03/25/2013  . Carpal tunnel syndrome 02/25/2013  . Hypokalemia 05/17/2012  .  Normal coronary arteries, 2008 05/15/2012  . Type II or unspecified type diabetes mellitus with neurological manifestations, not stated as uncontrolled(250.60) 05/15/2012  . HTN (hypertension) 05/15/2012  . Acute respiratory distress 05/15/2012  . Abdominal pain, acute, right upper quadrant 05/12/2012  . Cholecystitis chronic, acute, S/P lap cholecystectomy 05/14/12 05/12/2012  . Thoracic aortic aneurysm, 4.8cm 2011   . CHF, past NICM with an EF of 30%, most recent echo 2011 showed EF to be45- 50%   . Anemia 08/09/2011  . History of colon cancer, stage III 08/09/2011    Past Surgical History:  Procedure  Laterality Date  . CARDIAC CATHETERIZATION  2008   moderate severe pulm HTN; with elevted pulm capillary wedge pressure   . CHOLECYSTECTOMY  05/14/2012   Procedure: LAPAROSCOPIC CHOLECYSTECTOMY WITH INTRAOPERATIVE CHOLANGIOGRAM;  Surgeon: Haywood Lasso, MD;  Location: [facility];  Service: General;  Laterality: N/A;  . COLON SURGERY     to remove colon ca  . ERCP  05/17/2012   Procedure: ENDOSCOPIC RETROGRADE CHOLANGIOPANCREATOGRAPHY (ERCP);  Surgeon: Missy Sabins, MD;  Location: [facility];  Service: Gastroenterology;  Laterality: N/A;  . ERCP  08/12/2012   Procedure: ENDOSCOPIC RETROGRADE CHOLANGIOPANCREATOGRAPHY (ERCP);  Surgeon: Missy Sabins, MD;  Location: [facility];  Service: Endoscopy;  Laterality: N/A;  . HEMICOLECTOMY  08/11/1997  . KIDNEY STONE SURGERY    . TRANSTHORACIC ECHOCARDIOGRAM  11/2009   EF 45-50%, mild-mod AV regurg; mild MR; mod TR; ascending aorta mildly dilated    OB History    No data available       Home Medications    Prior to Admission medications   Medication Sig Start Date End Date Taking? Authorizing Provider  allopurinol (ZYLOPRIM) 100 MG tablet TAKE 1 TABLET BY MOUTH  DAILY Patient taking differently: Take 100 mg by mouth once a day 12/24/16  Yes [name], [name], DO  apixaban (ELIQUIS) 5 MG TABS tablet Take 1 tablet (5 mg total) by mouth 2 (two) times daily. 09/03/16  Yes [name], [name], DO  cloNIDine (CATAPRES) 0.2 MG tablet take 1 tablet by mouth three times a day for hypertension Patient taking differently: Take 0.2 mg by mouth 3 (three) times daily. FOR HYPERTENSION 09/03/16  Yes [name], [name], DO  donepezil (ARICEPT) 10 MG tablet TAKE 1 TABLET BY MOUTH AT  BEDTIME Patient taking differently: Take 10 mg by mouth at bedtime 11/19/16  Yes Gildardo Cranker, DO  furosemide (LASIX) 20 MG tablet Take 1 tablet (20 mg) by mouth every other day, may also take one additional tablet daily for weight gain greater than 3 lbs. Patient taking differently: Take 20 mg by  mouth every other day. And may also take one additional tablet daily for weight gain greater than 3 lbs. 09/03/16  Yes [name], [name], DO  hydrALAZINE (APRESOLINE) 50 MG tablet Take 1 tablet (50 mg total) by mouth 3 (three) times daily. Patient taking differently: Take 50 mg by mouth 2 (two) times daily.  04/12/16  Yes Gherghe, Vella Redhead, MD  isosorbide mononitrate (IMDUR) 30 MG 24 hr tablet TAKE 1 TABLET BY MOUTH  DAILY Patient taking differently: Take 30 mg by mouth once a day 08/27/16  Yes [name], [name], DO  levothyroxine (SYNTHROID, LEVOTHROID) 50 MCG tablet TAKE 1 TABLET BY MOUTH  DAILY BEFORE BREAKFAST Patient taking differently: Take 50 mcg by mouth daily before breakfast 10/08/16  Yes [name], [name], DO  lisinopril (PRINIVIL,ZESTRIL) 5 MG tablet Take 1 tablet (5 mg total) by mouth daily.  08/27/16  Yes Arrien, Jimmy Picket, MD  metoprolol (LOPRESSOR) 100 MG tablet TAKE 1 TABLET BY MOUTH TWO  TIMES DAILY Patient taking differently: Take 100 mg by mouth two times a day 11/19/16  Yes Eulas Post, Monica, DO  potassium chloride (K-DUR) 10 MEQ tablet Take 1 tablet (10 mEq total) by mouth every other day. 09/03/16  Yes [name], [name], DO    Family History Family History  Problem Relation Age of Onset  . Heart disease Mother   . Stroke Father   . Hypertension Daughter   . Hypertension Son   . Diabetes Sister   . Hypertension Son     Social History Social History  Substance Use Topics  . Smoking status: Never Smoker  . Smokeless tobacco: Never Used  . Alcohol use No     Allergies   Iohexol; Iodinated diagnostic agents; and Z-pak [azithromycin]   Review of Systems Review of Systems  Unable to perform ROS: Dementia     Physical Exam Updated Vital Signs BP (!) 160/78   Pulse (!) 45   Temp 98 F (36.7 C) (Oral)   Resp 17   Ht 5\' 8"  (1.727 m)   Wt 66.2 kg (146 lb)   SpO2 100%   BMI 22.20 kg/m   Physical Exam  Constitutional: She appears well-developed and well-nourished.  No distress.  HENT:  Head: Normocephalic and atraumatic.  Neck: Neck supple.  Cardiovascular: Normal rate and regular rhythm.   Pulmonary/Chest: Effort normal. Tachypnea noted. No respiratory distress. She has decreased breath sounds. She has no wheezes. She has rales in the left lower field.  Abdominal: Soft. She exhibits no distension. There is no tenderness. There is no rebound and no guarding.  Musculoskeletal: She exhibits no edema.  Neurological: She is alert. GCS eye subscore is 4. GCS verbal subscore is 5. GCS motor subscore is 6.  Moves all extremities.  Knows she is at [place] it is Monday, and December.  Cannot come up with a year.    Skin: She is not diaphoretic.  Nursing note and vitals reviewed.    ED Treatments / Results  Labs (all labs ordered are listed, but only abnormal results are displayed) Labs Reviewed  BASIC METABOLIC PANEL - Abnormal; Notable for the following:       Result Value   Potassium 3.4 (*)    Glucose, Bld 127 (*)    Creatinine, Ser 1.07 (*)    GFR calc non Af Amer 47 (*)    GFR calc Af Amer 54 (*)    All other components within normal limits  CBC - Abnormal; Notable for the following:    Hemoglobin 11.6 (*)    HCT 33.9 (*)    MCV 68.9 (*)    MCH 23.6 (*)    RDW 18.0 (*)    All other components within normal limits  URINALYSIS, ROUTINE W REFLEX MICROSCOPIC - Abnormal; Notable for the following:    Protein, ur 100 (*)    Bacteria, UA MANY (*)    Squamous Epithelial / LPF 0-5 (*)    All other components within normal limits  URINE CULTURE  TROPONIN I  TROPONIN I  TROPONIN I  MAGNESIUM  I-STAT TROPOININ, ED  I-STAT CG4 LACTIC ACID, ED  CBG MONITORING, ED  I-STAT CG4 LACTIC ACID, ED  Randolm Idol, ED    EKG  EKG Interpretation  Date/Time:  Friday January 11 2017 13:51:51 EDT Ventricular Rate:  70 PR Interval:  QRS Duration: 110 QT Interval:  470 QTC Calculation: 507 R Axis:   73 Text Interpretation:   Atrial fibrillation Incomplete right bundle branch block ST & T wave abnormality, consider inferior ischemia or digitalis effect similar st segments last ecg Otherwise no significant change Confirmed by Deno Etienne [phone]) on 01/11/2017 2:19:39 PM       Radiology Ct Angio Chest Pe W/cm &/or Wo Cm  Result Date: 01/11/2017 CLINICAL DATA:  Chest pain, shortness of breath. EXAM: CT ANGIOGRAPHY CHEST WITH CONTRAST TECHNIQUE: Multidetector CT imaging of the chest was performed using the standard protocol during bolus administration of intravenous contrast. Multiplanar CT image reconstructions and MIPs were obtained to evaluate the vascular anatomy. CONTRAST:  100 mL of Isovue 370 intravenously. COMPARISON:  CT scan of August 23, 2016. FINDINGS: Cardiovascular: 4.8 cm ascending thoracic aortic aneurysm is noted. Atherosclerosis of thoracic aorta is noted. Filling defect is again noted in lower lobe branch of right pulmonary artery, and it is uncertain if this represents chronic or recurrent acute thrombus. Mild cardiomegaly is noted. No pericardial effusion is noted. Stable enlargement of pulmonary artery is noted suggesting pulmonary artery hypertension. Mediastinum/Nodes: No enlarged mediastinal, hilar, or axillary lymph nodes. Thyroid gland, trachea, and esophagus demonstrate no significant findings. Lungs/Pleura: No pneumothorax is noted. Moderate right pleural effusion is noted with adjacent subsegmental atelectasis. Left lung is clear. Upper Abdomen: Left lung is clear. Musculoskeletal: No chest wall abnormality. No acute or significant osseous findings. Review of the MIP images confirms the above findings. IMPRESSION: Filling defect is again noted in lower lobe branch of right pulmonary artery ; it is uncertain if this represents chronic or recurrent acute pulmonary embolus. Critical Value/emergent results were called by telephone at the time of interpretation on 01/11/2017 at 5:13 pm to Dr. Clayton Bibles , who  verbally acknowledged these results. Stable enlargement of pulmonary artery suggesting pulmonary artery hypertension. Moderate right pleural effusion is noted with adjacent subsegmental atelectasis. 4.8 cm ascending thoracic aortic aneurysm is noted. Ascending thoracic aortic aneurysm. Recommend semi-annual imaging followup by CTA or MRA and referral to cardiothoracic surgery if not already obtained. This recommendation follows 2010 ACCF/AHA/AATS/ACR/ASA/SCA/SCAI/SIR/STS/SVM Guidelines for the Diagnosis and Management of Patients With Thoracic Aortic Disease. Circulation. 2010; 121: S283-T517. Aortic Atherosclerosis (ICD10-I70.0). Electronically Signed   By: Marijo Conception, M.D.   On: 01/11/2017 17:13   Dg Chest Portable 1 View  Result Date: 01/11/2017 CLINICAL DATA:  Chest pain and dyspnea EXAM: PORTABLE CHEST 1 VIEW COMPARISON:  08/22/2016 chest radiograph. FINDINGS: Stable cardiomediastinal silhouette with mild to moderate cardiomegaly and aortic atherosclerosis. No pneumothorax. No pleural effusion. No overt pulmonary edema. Mild curvilinear bibasilar opacities. IMPRESSION: 1. Stable cardiomegaly without overt pulmonary edema . 2. Hazy curvilinear bibasilar lung opacities, favor scarring or atelectasis. Electronically Signed   By: Ilona Sorrel M.D.   On: 01/11/2017 14:42    Procedures Procedures (including critical care time)  Medications Ordered in ED Medications  hydrALAZINE (APRESOLINE) injection 5-10 mg (not administered)  albuterol (PROVENTIL) (2.5 MG/3ML) 0.083% nebulizer solution 2.5 mg (not administered)  potassium phosphate 20 mEq in dextrose 5 % 250 mL infusion (not administered)  magnesium sulfate IVPB 1 g 100 mL (not administered)  ipratropium-albuterol (DUONEB) 0.5-2.5 (3) MG/3ML nebulizer solution 3 mL (3 mLs Nebulization Given 01/11/17 1456)  diphenhydrAMINE (BENADRYL) injection 25 mg (25 mg Intravenous Given 01/11/17 1530)  metoCLOPramide (REGLAN) injection 10 mg (10 mg  Intravenous Given 01/11/17 1530)  iopamidol (ISOVUE-370) 76 % injection (100 mLs  Contrast Given 01/11/17  1640)  sodium chloride 0.9 % bolus 500 mL (500 mLs Intravenous New Bag/Given 01/11/17 1933)     Initial Impression / Assessment and Plan / ED Course  I have reviewed the triage vital signs and the nursing notes.  Pertinent labs & imaging results that were available during my care of the patient were reviewed by me and considered in my medical decision making (see chart for details).    Afebrile nontoxic but dry, weak appearing patient brought from home for SOB, generalized weakness.  Was seen by PCP earlier today and sent to ED.  She was tachypneic on arrival but maintained her O2 sat, breathing more comfortably on O2 by [place].  Is on eliquis at home, per son she should be getting her medications - managed by aide or family members. CT demonstrates PE that must be assumed be new.  She does have some effusion around this, also has some bacteria in her urine.  Will sent UA for culture.  First troponin negative.  EKG without noted acute changes.  Discussed pt with ED attending Dr Tyrone Nine who also saw the patient.  Spoke with Dr Myna Hidalgo, [facility], who saw patient in ED and accepted patient for admission.    Final Clinical Impressions(s) / ED Diagnoses   Final diagnoses:  Shortness of breath  Generalized weakness  Other pulmonary embolism without acute cor pulmonale, unspecified chronicity ([place])    New Prescriptions New Prescriptions   No medications on file     Clayton Bibles, Hershal Coria 01/11/17 [place], [place], DO 01/12/17 [phone]

## 2017-01-11 NOTE — ED Notes (Signed)
Main lab contacted regarding status of urinalysis; Lab confirmed that they had received the specimen.

## 2017-01-11 NOTE — ED Triage Notes (Addendum)
Pt sent here by her pcp for acute onset L sided chest pain, sob and weakness since last night.  Breathing labored at 33, lung sounds diminished.  Pt lethargic which is not her norm, though easily aroused.

## 2017-01-11 NOTE — H&P (Signed)
History and Physical    Nicole [name] EYC:[id number] DOB: 10-14-1930 DOA: 01/11/2017  PCP: [name], DO   Patient coming from: Home, by way of PCP clinic   Chief Complaint: Generalized weakness, respiratory distress  HPI: Nicole [name] is a 83 y.o. female with complicated past medical history significant for history of intracranial hemorrhage, vascular dementia, chronic atrial fibrillation and PE on Eliquis, history of GI bleed, hypertension, chronic kidney disease stage II-III, and hypothyroidism, now presented to the emergency department at the direction of her PCP for evaluation of generalized weakness, lethargy, and respiratory distress. Patient is accompanied by family who assist with the history. She had reportedly been ambulatory until approximately one week ago. Since that time, she has been increasingly weak generally, and lethargic per her family. She has had poor appetite over this interval. Patient does not voice any complaints. She lives at home with caretakers. Today, she was so weak, that she had to be lifted out of the bed to go see her PCP. There has not been any fevers reported, no vomiting or diarrhea, no fall or trauma, and the patient was not noted to have any apparent respiratory distress until she was at her PCPs office today for evaluation of lethargy. She was noted to be tachypneic with labored breathing, but good O2 saturations. She was directed to the ED for further evaluation of this.  ED Course: Upon arrival to the ED, patient is found to be afebrile, saturating well on room air, hypertensive, but with vitals otherwise stable. EKG features atrial fibrillation with incomplete left bundle-branch block and inferolateral ST-T abnormalities, similar to prior. Chest x-ray is notable for stable cardiomegaly without overt pulmonary edema. Chemistry panel reveals a mild hyponatremia and a serum creatinine 1.07 which is better than priors. CBC features a stable chronic  microcytic anemia with hemoglobin of 11.6 and MCV of 68.9. Lactic acid is reassuring at 1.49, troponin is within the normal limits, and urinalysis is unremarkable. Patient was sent for CTA chest that demonstrates a right lower lobe branch pulmonary embolism which is in the same location as her prior PE and unclear if this is an acute or chronic embolism. Also noted on the CTA is her known ascending aortic aneurysm, measuring 4.8 cm. Patient was treated with DuoNeb, Reglan, Benadryl, and 500 mL normal saline bolus in the ED. She remained hypertensive, but otherwise stable and in no apparent respiratory distress. She'll be observed on the telemetry unit for ongoing evaluation and management of generalized weakness and lethargy.  Review of Systems:  Unable to obtain ROS secondary to pt's clinical condition with advanced dementia.  Past Medical History:  Diagnosis Date  . AAA (abdominal aortic aneurysm) ([place])   . Acute upper respiratory infections of unspecified site   . Anemia   . Anxiety   . Aortic aneurysm of unspecified site without mention of rupture   . Arthritis   . Atrial fibrillation ([place])   . Cervicalgia   . Cholelithiasis   . Chronic systolic heart failure ([place])   . CKD (chronic kidney disease)   . Colon cancer ([place]) 07/1997  . Depression   . Diabetes mellitus ([place])   . Dizziness and giddiness   . Electrolyte and fluid disorders not elsewhere classified   . GERD (gastroesophageal reflux disease)   . Gout, unspecified   . Hemorrhage of rectum and anus   . Hypercholesteremia   . Hypertension   . Hypoglycemia, unspecified   . Hypopotassemia   . Hypothyroidism   .  Kidney stone    renal calculi  . Malignant neoplasm of colon, unspecified site   . Other chronic allergic conjunctivitis   . Other specified cardiac dysrhythmias(427.89)   . Pain in joint, lower leg   . Perforation of bile duct   . Proteinuria   . Shortness of breath   . Small bowel obstruction due to adhesions  ([place]) 05/16/2012  . Stroke ([place])   . Swelling, mass, or lump in head and neck   . Thoracic aneurysm without mention of rupture    4.6 cm Asc Aortic aneurysm, CT 07/2015    Past Surgical History:  Procedure Laterality Date  . CARDIAC CATHETERIZATION  2008   moderate severe pulm HTN; with elevted pulm capillary wedge pressure   . CHOLECYSTECTOMY  05/14/2012   Procedure: LAPAROSCOPIC CHOLECYSTECTOMY WITH INTRAOPERATIVE CHOLANGIOGRAM;  Surgeon: Haywood Lasso, MD;  Location: [facility];  Service: General;  Laterality: N/A;  . COLON SURGERY     to remove colon ca  . ERCP  05/17/2012   Procedure: ENDOSCOPIC RETROGRADE CHOLANGIOPANCREATOGRAPHY (ERCP);  Surgeon: Missy Sabins, MD;  Location: [facility];  Service: Gastroenterology;  Laterality: N/A;  . ERCP  08/12/2012   Procedure: ENDOSCOPIC RETROGRADE CHOLANGIOPANCREATOGRAPHY (ERCP);  Surgeon: Missy Sabins, MD;  Location: [facility] ENDOSCOPY;  Service: Endoscopy;  Laterality: N/A;  . HEMICOLECTOMY  08/11/1997  . KIDNEY STONE SURGERY    . TRANSTHORACIC ECHOCARDIOGRAM  11/2009   EF 45-50%, mild-mod AV regurg; mild MR; mod TR; ascending aorta mildly dilated     reports that she has never smoked. She has never used smokeless tobacco. She reports that she does not drink alcohol or use drugs.  Allergies  Allergen Reactions  . Iohexol Nausea And Vomiting  . Iodinated Diagnostic Agents Nausea And Vomiting  . Z-Pak [Azithromycin]     Reaction unknown by family    Family History  Problem Relation Age of Onset  . Heart disease Mother   . Stroke Father   . Hypertension Daughter   . Hypertension Son   . Diabetes Sister   . Hypertension Son      Prior to Admission medications   Medication Sig Start Date End Date Taking? Authorizing Provider  allopurinol (ZYLOPRIM) 100 MG tablet TAKE 1 TABLET BY MOUTH  DAILY Patient taking differently: Take 100 mg by mouth once a day 12/24/16  Yes [name], [name], DO  apixaban (ELIQUIS) 5 MG TABS tablet Take 1 tablet (5 mg  total) by mouth 2 (two) times daily. 09/03/16  Yes [name], [name], DO  cloNIDine (CATAPRES) 0.2 MG tablet take 1 tablet by mouth three times a day for hypertension Patient taking differently: Take 0.2 mg by mouth 3 (three) times daily. FOR HYPERTENSION 09/03/16  Yes [name], [name], DO  donepezil (ARICEPT) 10 MG tablet TAKE 1 TABLET BY MOUTH AT  BEDTIME Patient taking differently: Take 10 mg by mouth at bedtime 11/19/16  Yes Gildardo Cranker, DO  furosemide (LASIX) 20 MG tablet Take 1 tablet (20 mg) by mouth every other day, may also take one additional tablet daily for weight gain greater than 3 lbs. Patient taking differently: Take 20 mg by mouth every other day. And may also take one additional tablet daily for weight gain greater than 3 lbs. 09/03/16  Yes [name], [name], DO  hydrALAZINE (APRESOLINE) 50 MG tablet Take 1 tablet (50 mg total) by mouth 3 (three) times daily. Patient taking differently: Take 50 mg by mouth 2 (two) times daily.  04/12/16  Yes Caren Griffins, MD  isosorbide mononitrate (IMDUR) 30 MG 24 hr tablet TAKE 1 TABLET BY MOUTH  DAILY Patient taking differently: Take 30 mg by mouth once a day 08/27/16  Yes [name], [name], DO  levothyroxine (SYNTHROID, LEVOTHROID) 50 MCG tablet TAKE 1 TABLET BY MOUTH  DAILY BEFORE BREAKFAST Patient taking differently: Take 50 mcg by mouth daily before breakfast 10/08/16  Yes [name], [name], DO  lisinopril (PRINIVIL,ZESTRIL) 5 MG tablet Take 1 tablet (5 mg total) by mouth daily. 08/27/16  Yes Arrien, Jimmy Picket, MD  metoprolol (LOPRESSOR) 100 MG tablet TAKE 1 TABLET BY MOUTH TWO  TIMES DAILY Patient taking differently: Take 100 mg by mouth two times a day 11/19/16  Yes Eulas Post, Monica, DO  potassium chloride (K-DUR) 10 MEQ tablet Take 1 tablet (10 mEq total) by mouth every other day. 09/03/16  Yes [name], DO    Physical Exam: Vitals:   01/11/17 1830 01/11/17 1845 01/11/17 1915 01/11/17 1930  BP: (!) 152/77 (!) 166/76 (!) 156/79 (!)  160/78  Pulse: (!) 53 (!) 57 (!) 56 (!) 45  Resp: 18 16 18 17   Temp:      TempSrc:      SpO2: 100% 100% 100% 100%  Weight:      Height:          Constitutional: No respiratory distress. Sleeping, wakes to voice and makes brief eye-contact.  Eyes: PERTLA, lids and conjunctivae normal ENMT: Mucous membranes are moist. Posterior pharynx clear of any exudate or lesions.   Neck: normal, supple, no masses, no thyromegaly Respiratory: clear to auscultation bilaterally, no wheezing, no crackles. Normal respiratory effort.   Cardiovascular: Rate ~50 and irregular with grade 3 holosystolic murmur at RUSB. No extremity edema. No significant JVD. Abdomen: No distension, no tenderness, no masses palpated. Bowel sounds active.  Musculoskeletal: no clubbing / cyanosis. No joint deformity upper and lower extremities.   Skin: no significant rashes, lesions, ulcers. Poor turgor.   Neurologic: Chronic left-sided weakness. No verbal response. Opens eyes to voice and makes brief eye-contact.   Psychiatric: Non-verbal. Does not follow commands.     Labs on Admission: I have personally reviewed following labs and imaging studies  CBC:  Recent Labs Lab 01/11/17 1405  WBC 5.8  HGB 11.6*  HCT 33.9*  MCV 68.9*  PLT 967   Basic Metabolic Panel:  Recent Labs Lab 01/11/17 1405  NA 139  K 3.4*  CL 104  CO2 26  GLUCOSE 127*  BUN 12  CREATININE 1.07*  CALCIUM 9.0   GFR: Estimated Creatinine Clearance: 40.2 mL/min (A) (by C-G formula based on SCr of 1.07 mg/dL (H)). Liver Function Tests: No results for input(s): AST, ALT, ALKPHOS, BILITOT, PROT, ALBUMIN in the last 168 hours. No results for input(s): LIPASE, AMYLASE in the last 168 hours. No results for input(s): AMMONIA in the last 168 hours. Coagulation Profile: No results for input(s): INR, PROTIME in the last 168 hours. Cardiac Enzymes: No results for input(s): CKTOTAL, CKMB, CKMBINDEX, TROPONINI in the last 168 hours. BNP (last 3  results) No results for input(s): PROBNP in the last 8760 hours. HbA1C: No results for input(s): HGBA1C in the last 72 hours. CBG:  Recent Labs Lab 01/11/17 1949  GLUCAP 94   Lipid Profile: No results for input(s): CHOL, HDL, LDLCALC, TRIG, CHOLHDL, LDLDIRECT in the last 72 hours. Thyroid Function Tests: No results for input(s): TSH, T4TOTAL, FREET4, T3FREE, THYROIDAB in the last 72 hours. Anemia Panel: No results for  input(s): VITAMINB12, FOLATE, FERRITIN, TIBC, IRON, RETICCTPCT in the last 72 hours. Urine analysis:    Component Value Date/Time   COLORURINE YELLOW 01/11/2017 1726   APPEARANCEUR CLEAR 01/11/2017 1726   LABSPEC 1.029 01/11/2017 1726   PHURINE 6.0 01/11/2017 1726   GLUCOSEU NEGATIVE 01/11/2017 1726   HGBUR NEGATIVE 01/11/2017 1726   BILIRUBINUR NEGATIVE 01/11/2017 1726   KETONESUR NEGATIVE 01/11/2017 1726   PROTEINUR 100 (A) 01/11/2017 1726   UROBILINOGEN 2.0 (H) 04/13/2015 1844   NITRITE NEGATIVE 01/11/2017 1726   LEUKOCYTESUR NEGATIVE 01/11/2017 1726   Sepsis Labs: @LABRCNTIP (procalcitonin:4,lacticidven:4) )No results found for this or any previous visit (from the past 240 hour(s)).   Radiological Exams on Admission: Ct Angio Chest Pe W/cm &/or Wo Cm  Result Date: 01/11/2017 CLINICAL DATA:  Chest pain, shortness of breath. EXAM: CT ANGIOGRAPHY CHEST WITH CONTRAST TECHNIQUE: Multidetector CT imaging of the chest was performed using the standard protocol during bolus administration of intravenous contrast. Multiplanar CT image reconstructions and MIPs were obtained to evaluate the vascular anatomy. CONTRAST:  100 mL of Isovue 370 intravenously. COMPARISON:  CT scan of August 23, 2016. FINDINGS: Cardiovascular: 4.8 cm ascending thoracic aortic aneurysm is noted. Atherosclerosis of thoracic aorta is noted. Filling defect is again noted in lower lobe branch of right pulmonary artery, and it is uncertain if this represents chronic or recurrent acute thrombus. Mild  cardiomegaly is noted. No pericardial effusion is noted. Stable enlargement of pulmonary artery is noted suggesting pulmonary artery hypertension. Mediastinum/Nodes: No enlarged mediastinal, hilar, or axillary lymph nodes. Thyroid gland, trachea, and esophagus demonstrate no significant findings. Lungs/Pleura: No pneumothorax is noted. Moderate right pleural effusion is noted with adjacent subsegmental atelectasis. Left lung is clear. Upper Abdomen: Left lung is clear. Musculoskeletal: No chest wall abnormality. No acute or significant osseous findings. Review of the MIP images confirms the above findings. IMPRESSION: Filling defect is again noted in lower lobe branch of right pulmonary artery ; it is uncertain if this represents chronic or recurrent acute pulmonary embolus. Critical Value/emergent results were called by telephone at the time of interpretation on 01/11/2017 at 5:13 pm to Dr. Clayton Bibles , who verbally acknowledged these results. Stable enlargement of pulmonary artery suggesting pulmonary artery hypertension. Moderate right pleural effusion is noted with adjacent subsegmental atelectasis. 4.8 cm ascending thoracic aortic aneurysm is noted. Ascending thoracic aortic aneurysm. Recommend semi-annual imaging followup by CTA or MRA and referral to cardiothoracic surgery if not already obtained. This recommendation follows 2010 ACCF/AHA/AATS/ACR/ASA/SCA/SCAI/SIR/STS/SVM Guidelines for the Diagnosis and Management of Patients With Thoracic Aortic Disease. Circulation. 2010; 121: L465-K354. Aortic Atherosclerosis (ICD10-I70.0). Electronically Signed   By: Marijo Conception, M.D.   On: 01/11/2017 17:13   Dg Chest Portable 1 View  Result Date: 01/11/2017 CLINICAL DATA:  Chest pain and dyspnea EXAM: PORTABLE CHEST 1 VIEW COMPARISON:  08/22/2016 chest radiograph. FINDINGS: Stable cardiomediastinal silhouette with mild to moderate cardiomegaly and aortic atherosclerosis. No pneumothorax. No pleural effusion. No  overt pulmonary edema. Mild curvilinear bibasilar opacities. IMPRESSION: 1. Stable cardiomegaly without overt pulmonary edema . 2. Hazy curvilinear bibasilar lung opacities, favor scarring or atelectasis. Electronically Signed   By: Ilona Sorrel M.D.   On: 01/11/2017 14:42    EKG: Independently reviewed. Atrial fibrillation, incomplete RBBB, inferolateral ST-T abnormality, similar to prior.   Assessment/Plan  1. Generalized weakness, lethargy  - Pt presents with 1 week of progressive weakness and lethargy  - She has hx of ICH and chronic left-sided weakness, but was ambulatory 1 wk ago per  family, now had to be lifted out of bed and carried to car to see PCP - No definite new focal neurologic findings, though difficult to determine on background of chronic deficits  - No fever or leukocytosis; UA and CXR reviewed and appears non-contributory  - Plan to eval further with head CT, TSH, B12, folate, and ammonia levels    2. Hx of hemorrhagic CVA, vascular dementia  - Pt with chronic left-sided weakness and non-verbal state  - Check head CT as above   3. Pulmonary embolism  - Pt is treated with Eliquis for PE, administered by caretaker  - CTA performed in ED with a right lower branch PE, same location as prior, not clear if chronic or recurrent and acute  - Given hx of ICH and GIB, and with unlabored breathing and no hypoxia, hesitant to change her [facility]    - Check troponin, monitor on telemetry, continue Eliquis   4. Chronic atrial fibrillation  - Pt in atrial fibrillation with slow rate on admission  - CHADS-VASc is 5 (age x2, gender, CVA x2, HTN, DM)  - Continue Eliquis, continue metoprolol with dose reduction and holding-parameters   5. Hypertension with hypertensive urgency  - BP 200/90 range in ED  - Managed at home with clonidine, lisinopril, hydralazine, and Lopressor - Continue Lopressor with dose-reduction and holding parameters, continue other home agents  - Hydralazine IVP's  available prn   6. Bradycardia - Pt presents in AF with rates in 40-70 range  - Checking troponin, monitoring on telemetry, reduce Lopressor from 100 mg BID to 25 BID with holding parameters   7. Hypothyroidism  - Check TSH given the presentation  - Continue Synthroid   8. Microcytic anemia - Hgb is 11.6 and MCV 68.9 on admission  - Appears to be stable with no evidence for acute bleeding    DVT prophylaxis: Eliquis  Code Status: DNR Family Communication: Children updated at bedside Disposition Plan: Observe on telemetry Consults called: None Admission status: Observation    Vianne Bulls, MD [facility] Pager [phone]  If 7PM-7AM, please contact night-coverage [email or web address] Password [facility]  01/11/2017, 8:23 PM

## 2017-01-11 NOTE — ED Notes (Signed)
Patient transported to CT 

## 2017-01-11 NOTE — Progress Notes (Addendum)
Careteam: Patient Care Team: [name], DO as PCP - General (Geriatric Medicine) Neldon Mc, MD as Consulting Physician (General Surgery) Debara Pickett Nadean Corwin, MD as Consulting Physician (Cardiology) Maia Breslow, MD as Consulting Physician (Orthopedic Surgery) Rutherford Guys, MD as Consulting Physician (Ophthalmology)  Advanced Directive information Does Patient Have a Medical Advance Directive?: Yes, Type of Advance Directive: Healthcare Power of [place];Living will  Allergies  Allergen Reactions  . Iohexol Nausea And Vomiting  . Iodinated Diagnostic Agents Nausea And Vomiting  . Z-Pak [Azithromycin] Other (See Comments)    Unknown allergic reaction    Chief Complaint  Patient presents with  . Acute Visit    SOB since last night. Here with son [name], [facility]  . Weakness    couldn't walk this morning or feed herself due to weakness  . Dizziness    started last night     HPI: Patient is a 83 y.o. female seen in the office today due to shortness of breath. Pt with extensive medical hx including PE, afib, hemorrhagic stroke, dementia, anxiety, AAA, CKD, DM, GERD and others Started to have shortness of breath last night. This morning caregiver and son noticed she was very dizzy and weak. Did not want to get out of the bed this morning. Very little energy. Baseline she walks and had to be picked up out of bed. In wheelchair today.  Ate a little breakfast this morning (fruit and oatmeal) but had to be fed. In the past week or so her eating has gotten much worse. Decrease PO intake.  Had been eating a lot of sweets.  Can not answer questions due to shortness of breath. Reports it is very painful to breath and talk.  No chest pains.  Took medication today. Took clonidine 0.2 mg prior to appt.   Review of Systems:  Review of Systems  Unable to perform ROS: Acuity of condition    Past Medical History:  Diagnosis Date  . AAA (abdominal aortic aneurysm)  ([place])   . Acute upper respiratory infections of unspecified site   . Anemia   . Anxiety   . Aortic aneurysm of unspecified site without mention of rupture   . Arthritis   . Atrial fibrillation ([place])   . Cervicalgia   . Cholelithiasis   . Chronic systolic heart failure ([place])   . CKD (chronic kidney disease)   . Colon cancer ([place]) 07/1997  . Depression   . Diabetes mellitus ([place])   . Dizziness and giddiness   . Electrolyte and fluid disorders not elsewhere classified   . GERD (gastroesophageal reflux disease)   . Gout, unspecified   . Hemorrhage of rectum and anus   . Hypercholesteremia   . Hypertension   . Hypoglycemia, unspecified   . Hypopotassemia   . Hypothyroidism   . Kidney stone    renal calculi  . Malignant neoplasm of colon, unspecified site   . Other chronic allergic conjunctivitis   . Other specified cardiac dysrhythmias(427.89)   . Pain in joint, lower leg   . Perforation of bile duct   . Proteinuria   . Shortness of breath   . Small bowel obstruction due to adhesions ([place]) 05/16/2012  . Stroke ([place])   . Swelling, mass, or lump in head and neck   . Thoracic aneurysm without mention of rupture    4.6 cm Asc Aortic aneurysm, CT 07/2015   Past Surgical History:  Procedure Laterality Date  . CARDIAC CATHETERIZATION  2008  moderate severe pulm HTN; with elevted pulm capillary wedge pressure   . CHOLECYSTECTOMY  05/14/2012   Procedure: LAPAROSCOPIC CHOLECYSTECTOMY WITH INTRAOPERATIVE CHOLANGIOGRAM;  Surgeon: Haywood Lasso, MD;  Location: [place];  Service: General;  Laterality: N/A;  . COLON SURGERY     to remove colon ca  . ERCP  05/17/2012   Procedure: ENDOSCOPIC RETROGRADE CHOLANGIOPANCREATOGRAPHY (ERCP);  Surgeon: Missy Sabins, MD;  Location: [place];  Service: Gastroenterology;  Laterality: N/A;  . ERCP  08/12/2012   Procedure: ENDOSCOPIC RETROGRADE CHOLANGIOPANCREATOGRAPHY (ERCP);  Surgeon: Missy Sabins, MD;  Location[facility];  Service: Endoscopy;   Laterality: N/A;  . HEMICOLECTOMY  08/11/1997  . KIDNEY STONE SURGERY    . TRANSTHORACIC ECHOCARDIOGRAM  11/2009   EF 45-50%, mild-mod AV regurg; mild MR; mod TR; ascending aorta mildly dilated   Social History:   reports that she has never smoked. She has never used smokeless tobacco. She reports that she does not drink alcohol or use drugs.  Family History  Problem Relation Age of Onset  . Heart disease Mother   . Stroke Father   . Hypertension Daughter   . Hypertension Son   . Diabetes Sister   . Hypertension Son     Medications: Patient's Medications  New Prescriptions   No medications on file  Previous Medications   ALLOPURINOL (ZYLOPRIM) 100 MG TABLET    TAKE 1 TABLET BY MOUTH  DAILY   APIXABAN (ELIQUIS) 5 MG TABS TABLET    Take 1 tablet (5 mg total) by mouth 2 (two) times daily.   CLONIDINE (CATAPRES) 0.2 MG TABLET    take 1 tablet by mouth three times a day for hypertension   DONEPEZIL (ARICEPT) 10 MG TABLET    TAKE 1 TABLET BY MOUTH AT  BEDTIME   FUROSEMIDE (LASIX) 20 MG TABLET    Take 1 tablet (20 mg) by mouth every other day, may also take one additional tablet daily for weight gain greater than 3 lbs.   HYDRALAZINE (APRESOLINE) 50 MG TABLET    Take 1 tablet (50 mg total) by mouth 3 (three) times daily.   ISOSORBIDE MONONITRATE (IMDUR) 30 MG 24 HR TABLET    TAKE 1 TABLET BY MOUTH  DAILY   LEVOTHYROXINE (SYNTHROID, LEVOTHROID) 50 MCG TABLET    TAKE 1 TABLET BY MOUTH  DAILY BEFORE BREAKFAST   LISINOPRIL (PRINIVIL,ZESTRIL) 5 MG TABLET    Take 1 tablet (5 mg total) by mouth daily.   METOPROLOL (LOPRESSOR) 100 MG TABLET    TAKE 1 TABLET BY MOUTH TWO  TIMES DAILY   POLYETHYLENE GLYCOL (MIRALAX / GLYCOLAX) PACKET    Take 17 g by mouth daily.   POTASSIUM CHLORIDE (K-DUR) 10 MEQ TABLET    Take 1 tablet (10 mEq total) by mouth every other day.  Modified Medications   No medications on file  Discontinued Medications   No medications on file     Physical Exam:  Vitals:    01/11/17 1318  BP: (!) 188/88  Pulse: 72  Temp: 97.4 F (36.3 C)  TempSrc: Oral  SpO2: 98%  Weight: 146 lb 3.2 oz (66.3 kg)  Height: 5\' 8"  (1.727 m)   There is no height or weight on file to calculate BMI.  Physical Exam  Constitutional: She appears well-developed. She appears lethargic. She appears distressed.  Thin  Cardiovascular: Intact distal pulses.  An irregular rhythm present.  Pulmonary/Chest: Tachypnea noted. She is in respiratory distress. She has decreased breath sounds (throughout). She  has no rales.  Abdominal: Bowel sounds are normal.  Musculoskeletal: Normal range of motion.  Neurological: She appears lethargic.  Skin: Skin is warm and dry.    Labs reviewed: Basic Metabolic Panel:  Recent Labs  02/17/16 1259 03/14/16 1608  04/11/16 1002  08/26/16 0159 09/03/16 1536 11/07/16 0856  NA 138 139  < > 139  < > 138 139 142  K 4.1 4.5  < > 3.8  < > 3.9 4.5 3.5  CL 105 106  < > 104  < > 109 106 104  CO2 26 23  < > 29  < > 24 26 26   GLUCOSE 147* 100*  < > 128*  < > 109* 226* 141*  BUN 21* 16  < > 15  < > 11 18 16   CREATININE 1.32* 1.11*  < > 1.28*  < > 1.08* 1.44* 1.23*  CALCIUM 8.8* 9.4  < > 8.3*  < > 8.4* 8.7 9.1  MG 1.7  --   --  1.4*  --   --   --   --   TSH  --  2.76  --   --   --   --   --   --   < > = values in this interval not displayed. Liver Function Tests:  Recent Labs  05/24/16 1106 07/20/16 1903 11/07/16 0856  AST 17 17 17   ALT 12 13* 10  ALKPHOS 82 79 107  BILITOT 0.5 0.4 1.0  PROT 7.1 7.2 7.6  ALBUMIN 3.6 3.3* 3.6    Recent Labs  07/20/16 1903  LIPASE 54*   No results for input(s): AMMONIA in the last 8760 hours. CBC:  Recent Labs  07/20/16 2028  08/23/16 0347  08/25/16 0432 08/26/16 0159 11/07/16 0856  WBC 6.2  < > 12.1*  < > 7.6 6.5 6.3  NEUTROABS 4.2  --  9.7*  --   --   --  3,906  HGB 11.6*  < > 10.6*  < > 10.0* 9.7* 12.1  HCT 33.4*  < > 31.6*  < > 29.4* 28.8* 39.0  MCV 68.7*  < > 70.2*  < > 69.8* 69.7* 75.0*    PLT 285  < > 267  < > 213 242 379  < > = values in this interval not displayed. Lipid Panel:  Recent Labs  11/07/16 0856  CHOL 174  HDL 47*  LDLCALC 109*  TRIG 89  CHOLHDL 3.7   TSH:  Recent Labs  03/14/16 1608  TSH 2.76   A1C: Lab Results  Component Value Date   HGBA1C 6.9 (H) 11/07/2016     Assessment/Plan 1. Shortness of breath 2. Weakness 3. Essential hypertension  Pt with complex medical hx. She is visibly short of breath and uncomfortable. She is unable to answer questions due to breathing and profoundly weak and lethargic. She has had decreased PO intake. BP elevated at 188/88 despite recent clonidine suspect this is due to respiratory distress.  O2 sats maintaining at 98-99% Discussed with caregiver and son and they are agreeable to send to ED. However do not want EMS to be called. Report they will take her directly to ED at this time. Triage nurse called and made aware.   Carlos American. Harle Battiest  [facility] [phone] 8 am - 5 pm) [phone] (after hours)

## 2017-01-11 NOTE — Patient Instructions (Signed)
Go immediately to [facility] Emergency department

## 2017-01-12 ENCOUNTER — Observation Stay (HOSPITAL_COMMUNITY): Payer: Medicare Other

## 2017-01-12 DIAGNOSIS — F329 Major depressive disorder, single episode, unspecified: Secondary | ICD-10-CM | POA: Diagnosis present

## 2017-01-12 DIAGNOSIS — Z85038 Personal history of other malignant neoplasm of large intestine: Secondary | ICD-10-CM | POA: Diagnosis not present

## 2017-01-12 DIAGNOSIS — I447 Left bundle-branch block, unspecified: Secondary | ICD-10-CM | POA: Diagnosis present

## 2017-01-12 DIAGNOSIS — E039 Hypothyroidism, unspecified: Secondary | ICD-10-CM | POA: Diagnosis not present

## 2017-01-12 DIAGNOSIS — R0603 Acute respiratory distress: Secondary | ICD-10-CM | POA: Diagnosis not present

## 2017-01-12 DIAGNOSIS — R531 Weakness: Secondary | ICD-10-CM | POA: Diagnosis not present

## 2017-01-12 DIAGNOSIS — I2699 Other pulmonary embolism without acute cor pulmonale: Secondary | ICD-10-CM | POA: Diagnosis not present

## 2017-01-12 DIAGNOSIS — I482 Chronic atrial fibrillation: Secondary | ICD-10-CM | POA: Diagnosis not present

## 2017-01-12 DIAGNOSIS — F419 Anxiety disorder, unspecified: Secondary | ICD-10-CM | POA: Diagnosis present

## 2017-01-12 DIAGNOSIS — R0602 Shortness of breath: Secondary | ICD-10-CM | POA: Diagnosis not present

## 2017-01-12 DIAGNOSIS — I161 Hypertensive emergency: Secondary | ICD-10-CM | POA: Diagnosis present

## 2017-01-12 DIAGNOSIS — E1142 Type 2 diabetes mellitus with diabetic polyneuropathy: Secondary | ICD-10-CM | POA: Diagnosis present

## 2017-01-12 DIAGNOSIS — Z515 Encounter for palliative care: Secondary | ICD-10-CM | POA: Diagnosis not present

## 2017-01-12 DIAGNOSIS — I10 Essential (primary) hypertension: Secondary | ICD-10-CM | POA: Diagnosis not present

## 2017-01-12 DIAGNOSIS — I616 Nontraumatic intracerebral hemorrhage, multiple localized: Secondary | ICD-10-CM | POA: Diagnosis present

## 2017-01-12 DIAGNOSIS — E034 Atrophy of thyroid (acquired): Secondary | ICD-10-CM | POA: Diagnosis present

## 2017-01-12 DIAGNOSIS — D509 Iron deficiency anemia, unspecified: Secondary | ICD-10-CM | POA: Diagnosis present

## 2017-01-12 DIAGNOSIS — Z66 Do not resuscitate: Secondary | ICD-10-CM | POA: Diagnosis not present

## 2017-01-12 DIAGNOSIS — I5022 Chronic systolic (congestive) heart failure: Secondary | ICD-10-CM | POA: Diagnosis present

## 2017-01-12 DIAGNOSIS — I498 Other specified cardiac arrhythmias: Secondary | ICD-10-CM | POA: Diagnosis not present

## 2017-01-12 DIAGNOSIS — Z8679 Personal history of other diseases of the circulatory system: Secondary | ICD-10-CM | POA: Diagnosis not present

## 2017-01-12 DIAGNOSIS — N183 Chronic kidney disease, stage 3 (moderate): Secondary | ICD-10-CM | POA: Diagnosis present

## 2017-01-12 DIAGNOSIS — I130 Hypertensive heart and chronic kidney disease with heart failure and stage 1 through stage 4 chronic kidney disease, or unspecified chronic kidney disease: Secondary | ICD-10-CM | POA: Diagnosis present

## 2017-01-12 DIAGNOSIS — I610 Nontraumatic intracerebral hemorrhage in hemisphere, subcortical: Secondary | ICD-10-CM | POA: Diagnosis not present

## 2017-01-12 DIAGNOSIS — E871 Hypo-osmolality and hyponatremia: Secondary | ICD-10-CM | POA: Diagnosis not present

## 2017-01-12 DIAGNOSIS — I712 Thoracic aortic aneurysm, without rupture: Secondary | ICD-10-CM | POA: Diagnosis present

## 2017-01-12 DIAGNOSIS — D649 Anemia, unspecified: Secondary | ICD-10-CM | POA: Diagnosis not present

## 2017-01-12 DIAGNOSIS — E854 Organ-limited amyloidosis: Secondary | ICD-10-CM | POA: Diagnosis present

## 2017-01-12 DIAGNOSIS — Z86711 Personal history of pulmonary embolism: Secondary | ICD-10-CM | POA: Diagnosis not present

## 2017-01-12 DIAGNOSIS — I160 Hypertensive urgency: Secondary | ICD-10-CM | POA: Diagnosis not present

## 2017-01-12 DIAGNOSIS — E876 Hypokalemia: Secondary | ICD-10-CM | POA: Diagnosis not present

## 2017-01-12 DIAGNOSIS — I680 Cerebral amyloid angiopathy: Secondary | ICD-10-CM | POA: Diagnosis present

## 2017-01-12 DIAGNOSIS — F0150 Vascular dementia without behavioral disturbance: Secondary | ICD-10-CM | POA: Diagnosis present

## 2017-01-12 LAB — CBC WITH DIFFERENTIAL/PLATELET
BASOS ABS: 0 10*3/uL (ref 0.0–0.1)
Basophils Relative: 0 %
Eosinophils Absolute: 0.1 10*3/uL (ref 0.0–0.7)
Eosinophils Relative: 1 %
HEMATOCRIT: 39.7 % (ref 36.0–46.0)
HEMOGLOBIN: 13 g/dL (ref 12.0–15.0)
LYMPHS PCT: 28 %
Lymphs Abs: 2 10*3/uL (ref 0.7–4.0)
MCH: 23 pg — ABNORMAL LOW (ref 26.0–34.0)
MCHC: 32.7 g/dL (ref 30.0–36.0)
MCV: 70.3 fL — AB (ref 78.0–100.0)
MONO ABS: 0.4 10*3/uL (ref 0.1–1.0)
Monocytes Relative: 6 %
NEUTROS ABS: 4.7 10*3/uL (ref 1.7–7.7)
Neutrophils Relative %: 66 %
Platelets: 296 10*3/uL (ref 150–400)
RBC: 5.65 MIL/uL — AB (ref 3.87–5.11)
RDW: 18.5 % — AB (ref 11.5–15.5)
WBC: 7.2 10*3/uL (ref 4.0–10.5)

## 2017-01-12 LAB — BASIC METABOLIC PANEL
ANION GAP: 12 (ref 5–15)
BUN: 12 mg/dL (ref 6–20)
CO2: 23 mmol/L (ref 22–32)
Calcium: 8.7 mg/dL — ABNORMAL LOW (ref 8.9–10.3)
Chloride: 103 mmol/L (ref 101–111)
Creatinine, Ser: 1.07 mg/dL — ABNORMAL HIGH (ref 0.44–1.00)
GFR, EST AFRICAN AMERICAN: 54 mL/min — AB (ref >60)
GFR, EST NON AFRICAN AMERICAN: 47 mL/min — AB (ref >60)
GLUCOSE: 136 mg/dL — AB (ref 65–99)
Potassium: 3.4 mmol/L — ABNORMAL LOW (ref 3.5–5.1)
Sodium: 138 mmol/L (ref 135–145)

## 2017-01-12 LAB — IRON AND TIBC
Iron: 32 ug/dL (ref 28–170)
SATURATION RATIOS: 10 % — AB (ref 10.4–31.8)
TIBC: 305 ug/dL (ref 250–450)
UIBC: 273 ug/dL

## 2017-01-12 LAB — TROPONIN I
Troponin I: <0.03 ng/mL (ref <0.03)
Troponin I: <0.03 ng/mL (ref <0.03)

## 2017-01-12 LAB — FERRITIN: FERRITIN: 21 ng/mL (ref 11–307)

## 2017-01-12 LAB — GLUCOSE, CAPILLARY
GLUCOSE-CAPILLARY: 145 mg/dL — AB (ref 65–99)
GLUCOSE-CAPILLARY: 287 mg/dL — AB (ref 65–99)
Glucose-Capillary: 171 mg/dL — ABNORMAL HIGH (ref 65–99)
Glucose-Capillary: 191 mg/dL — ABNORMAL HIGH (ref 65–99)

## 2017-01-12 LAB — MAGNESIUM: MAGNESIUM: 1.6 mg/dL — AB (ref 1.7–2.4)

## 2017-01-12 LAB — VITAMIN B12: Vitamin B-12: 265 pg/mL (ref 180–914)

## 2017-01-12 MED ORDER — GADOBENATE DIMEGLUMINE 529 MG/ML IV SOLN
14.0000 mL | Freq: Once | INTRAVENOUS | Status: AC | PRN
  Administered 2017-01-12: 14 mL via INTRAVENOUS

## 2017-01-12 MED ORDER — POTASSIUM CHLORIDE CRYS ER 20 MEQ PO TBCR
20.0000 meq | EXTENDED_RELEASE_TABLET | Freq: Once | ORAL | Status: AC
  Administered 2017-01-12: 20 meq via ORAL
  Filled 2017-01-12: qty 2

## 2017-01-12 MED ORDER — HYDRALAZINE HCL 20 MG/ML IJ SOLN: 5.0000 mg | INTRAMUSCULAR | Status: DC | PRN

## 2017-01-12 NOTE — Consult Note (Signed)
NEURO HOSPITALIST CONSULT NOTE   Requestig physician: Dr. Wendee Beavers  Reason for Consult: Acute right basal ganglia hemorrhage  History obtained from:  Patient and Chart     HPI:                                                                                                                                          Nicole [name] is an 83 y.o. female with a prior history of ICH, advanced vascular dementia, CKD, HTN and hypothyroidism, on Eliquis for chronic atrial fibrillation and PE, who presented to the ED for evaluation of generalized weakness, respiratory distress and lethargy. She was hypertensive in the ED.   MRI revealed an acute small RIGHT basal ganglia hemorrhage without mass effect. Subacute LEFT frontal lobe 14 x 10 mm hematoma is also noted. Multifocal hypointense lesions on SWAN images are most consistent with microhemorrhages secondary to amyloid angiopathy. 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 Moderate to severe chronic small vessel ischemic disease.  Past Medical History:  Diagnosis Date  . AAA (abdominal aortic aneurysm) ([place])   . Acute upper respiratory infections of unspecified site   . Anemia   . Anxiety   . Aortic aneurysm of unspecified site without mention of rupture   . Arthritis   . Atrial fibrillation ([place])   . Cervicalgia   . Cholelithiasis   . Chronic systolic heart failure ([place])   . CKD (chronic kidney disease)   . Colon cancer ([place]) 07/1997  . Depression   . Diabetes mellitus ([place])   . Dizziness and giddiness   . Electrolyte and fluid disorders not elsewhere classified   . GERD (gastroesophageal reflux disease)   . Gout, unspecified   . Hemorrhage of rectum and anus   . Hypercholesteremia   . Hypertension   . Hypoglycemia, unspecified   . Hypopotassemia   . Hypothyroidism   . Kidney stone    renal calculi  . Malignant neoplasm of colon, unspecified site   . Other chronic allergic conjunctivitis   . Other specified  cardiac dysrhythmias(427.89)   . Pain in joint, lower leg   . Perforation of bile duct   . Proteinuria   . Shortness of breath   . Small bowel obstruction due to adhesions ([place]) 05/16/2012  . Stroke ([place])   . Swelling, mass, or lump in head and neck   . Thoracic aneurysm without mention of rupture    4.6 cm Asc Aortic aneurysm, CT 07/2015    Past Surgical History:  Procedure Laterality Date  . CARDIAC CATHETERIZATION  2008   moderate severe pulm HTN; with elevted pulm capillary wedge pressure   . CHOLECYSTECTOMY  05/14/2012   Procedure: LAPAROSCOPIC CHOLECYSTECTOMY WITH INTRAOPERATIVE CHOLANGIOGRAM;  Surgeon: Haywood Lasso, MD;  Location: [place];  Service:  General;  Laterality: N/A;  . COLON SURGERY     to remove colon ca  . ERCP  05/17/2012   Procedure: ENDOSCOPIC RETROGRADE CHOLANGIOPANCREATOGRAPHY (ERCP);  Surgeon: Missy Sabins, MD;  Location: [place];  Service: Gastroenterology;  Laterality: N/A;  . ERCP  08/12/2012   Procedure: ENDOSCOPIC RETROGRADE CHOLANGIOPANCREATOGRAPHY (ERCP);  Surgeon: Missy Sabins, MD;  Location: [facility] END[facility]COPY;  Service: Endoscopy;  Laterality: N/A;  . HEMICOLECTOMY  08/11/1997  . KIDNEY STONE SURGERY    . TRANSTHORACIC ECHOCARDIOGRAM  11/2009   EF 45-50%, mild-mod AV regurg; mild MR; mod TR; ascending aorta mildly dilated    Family History  Problem Relation Age of Onset  . Heart disease Mother   . Stroke Father   . Hypertension Daughter   . Hypertension Son   . Diabetes Sister   . Hypertension Son    Social History:  reports that she has never smoked. She has never used smokeless tobacco. She reports that she does not drink alcohol or use drugs.  Allergies  Allergen Reactions  . Iohexol Nausea And Vomiting  . Iodinated Diagnostic Agents Nausea And Vomiting  . Z-Pak [Azithromycin]     Reaction unknown by family    MEDICATIONS:                                                                                                                      Prior to Admission:  Prescriptions Prior to Admission  Medication Sig Dispense Refill Last Dose  . allopurinol (ZYLOPRIM) 100 MG tablet TAKE 1 TABLET BY MOUTH  DAILY (Patient taking differently: Take 100 mg by mouth once a day) 90 tablet 1 01/11/2017 at am  . apixaban (ELIQUIS) 5 MG TABS tablet Take 1 tablet (5 mg total) by mouth 2 (two) times daily. 180 tablet 3 01/11/2017 at 0800  . cloNIDine (CATAPRES) 0.2 MG tablet take 1 tablet by mouth three times a day for hypertension (Patient taking differently: Take 0.2 mg by mouth 3 (three) times daily. FOR HYPERTENSION) 270 tablet 3 01/11/2017 at 1200  . donepezil (ARICEPT) 10 MG tablet TAKE 1 TABLET BY MOUTH AT  BEDTIME (Patient taking differently: Take 10 mg by mouth at bedtime) 90 tablet 0 01/10/2017 at pm  . furosemide (LASIX) 20 MG tablet Take 1 tablet (20 mg) by mouth every other day, may also take one additional tablet daily for weight gain greater than 3 lbs. (Patient taking differently: Take 20 mg by mouth every other day. And may also take one additional tablet daily for weight gain greater than 3 lbs.) 90 tablet 3 01/10/2017 at am  . hydrALAZINE (APRESOLINE) 50 MG tablet Take 1 tablet (50 mg total) by mouth 3 (three) times daily. (Patient taking differently: Take 50 mg by mouth 2 (two) times daily. ) 120 tablet 3 01/11/2017 at am  . isosorbide mononitrate (IMDUR) 30 MG 24 hr tablet TAKE 1 TABLET BY MOUTH  DAILY (Patient taking differently: Take 30 mg by mouth once a day) 90 tablet 1  01/11/2017 at am  . levothyroxine (SYNTHROID, LEVOTHROID) 50 MCG tablet TAKE 1 TABLET BY MOUTH  DAILY BEFORE BREAKFAST (Patient taking differently: Take 50 mcg by mouth daily before breakfast) 90 tablet 0 01/11/2017 at am  . lisinopril (PRINIVIL,ZESTRIL) 5 MG tablet Take 1 tablet (5 mg total) by mouth daily. 30 tablet 0 01/11/2017 at am  . metoprolol (LOPRESSOR) 100 MG tablet TAKE 1 TABLET BY MOUTH TWO  TIMES DAILY (Patient taking differently: Take 100 mg by mouth two times a  day) 180 tablet 0 01/11/2017 at 0800  . potassium chloride (K-DUR) 10 MEQ tablet Take 1 tablet (10 mEq total) by mouth every other day. 45 tablet 3 01/10/2017 at am   Scheduled: . allopurinol  100 mg Oral Daily  . cloNIDine  0.2 mg Oral TID  . donepezil  10 mg Oral QHS  . hydrALAZINE  50 mg Oral BID  . isosorbide mononitrate  30 mg Oral Daily  . levothyroxine  50 mcg Oral QAC breakfast  . lisinopril  5 mg Oral Daily  . metoprolol tartrate  50 mg Oral BID  . sodium chloride flush  3 mL Intravenous Q12H   Continuous:  JYN:[id number] **OR** acetaminophen, albuterol, bisacodyl, hydrALAZINE, HYDROcodone-acetaminophen, ondansetron **OR** ondansetron (ZOFRAN) IV, polyethylene glycol  ROS:                                                                                                                                       Denies headache or vision loss. Has occasional chest and abdominal pain. Other ROS as per HPI.   Blood pressure (!) 173/91, pulse 84, temperature 98.9 F (37.2 C), resp. rate (!) 22, height 5\' 8"  (1.727 m), weight 66.8 kg (147 lb 3.2 oz), SpO2 100 %.  General Examination:                                                                                                      HEENT-  [place]/AT  Lungs- Mildly tachypneic Extremities- No pallor or cyanosis  Neurological Examination Mental Status: Awake and alert. 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 Speech fluent. Naming intact. Has difficulty with a directional 2-step command. Repetition intact. Cranial Nerves:  II: Visual fields grossly intact. PERRL.  III,IV, VI: Mild incidental palpebral fissure asymmetry. EOMI without nystagmus.  V,VII: smile symmetric, facial temp sensation intact bilaterally VIII: hearing intact to voice IX,X: no hypophonia XI: Symmetric XII: midline tongue extension Motor: RUE:  5/5 proximal and distal LUE: 4/5 deltoid, otherwise 5/5 RLE: 5/5  proximal and distal LLE: 4/5 proxima and distal Mildly increased tone x 4.  Sensory: Decreased temperature sensation LUE. FT intact x 4 without extinction.  Deep Tendon Reflexes: 3+ biceps and brachioradialis bilaterally. 2+ patellae bilaterally. Toes downgoing bilaterally.  Cerebellar: No ataxia with FNF bilaterally.  Gait: Deferred  Lab Results: Basic Metabolic Panel:  Recent Labs Lab 01/11/17 1405 01/12/17 0700  NA 139 138  K 3.4* 3.4*  CL 104 103  CO2 26 23  GLUCOSE 127* 136*  BUN 12 12  CREATININE 1.07* 1.07*  CALCIUM 9.0 8.7*  MG  --  1.6*    Liver Function Tests: No results for input(s): AST, ALT, ALKPHOS, BILITOT, PROT, ALBUMIN in the last 168 hours. No results for input(s): LIPASE, AMYLASE in the last 168 hours.  Recent Labs Lab 01/11/17 2123  AMMONIA 24    CBC:  Recent Labs Lab 01/11/17 1405 01/12/17 0700  WBC 5.8 7.2  NEUTROABS  --  4.7  HGB 11.6* 13.0  HCT 33.9* 39.7  MCV 68.9* 70.3*  PLT 234 296    Cardiac Enzymes:  Recent Labs Lab 01/11/17 2123 01/12/17 0055 01/12/17 0700  TROPONINI <0.03 <0.03 <0.03    Lipid Panel: No results for input(s): CHOL, TRIG, HDL, CHOLHDL, VLDL, LDLCALC in the last 168 hours.  CBG:  Recent Labs Lab 01/11/17 1949 01/11/17 2125 01/12/17 0731 01/12/17 1147 01/12/17 1613  GLUCAP 94 98 145* 171* 191*    Microbiology: Results for orders placed or performed during the hospital encounter of 02/17/16  Blood culture (routine x 2)     Status: None   Collection Time: 02/17/16  1:00 PM  Result Value Ref Range Status   Specimen Description BLOOD LEFT ANTECUBITAL  Final   Special Requests BOTTLES DRAWN AEROBIC AND ANAEROBIC 5 CC EA  Final   Culture   Final    NO GROWTH 5 DAYS Performed at [facility]    Report Status 02/22/2016 FINAL  Final  Blood culture (routine x 2)     Status: None   Collection Time: 02/17/16  1:00 PM  Result Value Ref Range Status   Specimen Description BLOOD BLOOD RIGHT  FOREARM  Final   Special Requests BOTTLES DRAWN AEROBIC ONLY 8 CC  Final   Culture   Final    NO GROWTH 5 DAYS Performed at [facility]    Report Status 02/22/2016 FINAL  Final    Coagulation Studies: No results for input(s): LABPROT, INR in the last 72 hours.  Imaging: Ct Head Wo Contrast  Addendum Date: 01/12/2017   ADDENDUM REPORT: 01/12/2017 00:00 ADDENDUM: Critical Value/emergent results were called by telephone at the time of interpretation on 01/12/2017 at 12:00 am to NP Walden Field , who verbally acknowledged these results. Electronically Signed   By: Elon Alas M.D.   On: 01/12/2017 00:00   Result Date: 01/12/2017 CLINICAL DATA:  Weakness. History of colon cancer, intracranial hemorrhage, atrial fibrillation, diabetes, hypertension. EXAM: CT HEAD WITHOUT CONTRAST TECHNIQUE: Contiguous axial images were obtained from the base of the skull through the vertex without intravenous contrast. COMPARISON:  CT HEAD July 20, 2016 FINDINGS: BRAIN: 12 x 13 x 14 mm (volume = 1100 mm^3) acute RIGHT basal ganglia hemorrhage, minimal surrounding vasogenic edema. No significant mass effect. Faintly dense 12 x 13 mm LEFT frontal cortical to subcortical lesion, new from prior CT. Ventricles and sulci are normal for patient's age. Confluent supratentorial white matter  hypodensities. Old basal ganglia and thalamus lacunar infarcts. No acute large vascular territory infarct. No abnormal extra-axial fluid collections. VASCULAR: Moderate calcific atherosclerosis of the carotid siphons. SKULL: No skull fracture. No significant scalp soft tissue swelling. SINUSES/ORBITS: Chronic sphenoid sinusitis, hypoplastic RIGHT sphenoid. Mastoid air cells are well aerated.The included ocular globes and orbital contents are non-suspicious. OTHER: None. IMPRESSION: Acute 12 x 13 x 14 mm RIGHT basal ganglia hemorrhage without significant mass effect. 12 x 13 mm faintly dense LEFT frontal lobe lesion could reflect  subacute hemorrhage or, metastasis. MRI of the brain with contrast is recommended. Moderate to severe chronic small vessel ischemic disease and old lacunar infarcts. Electronically Signed: By: Elon Alas M.D. On: 01/11/2017 23:53   Ct Angio Chest Pe W/cm &/or Wo Cm  Result Date: 01/11/2017 CLINICAL DATA:  Chest pain, shortness of breath. EXAM: CT ANGIOGRAPHY CHEST WITH CONTRAST TECHNIQUE: Multidetector CT imaging of the chest was performed using the standard protocol during bolus administration of intravenous contrast. Multiplanar CT image reconstructions and MIPs were obtained to evaluate the vascular anatomy. CONTRAST:  100 mL of Isovue 370 intravenously. COMPARISON:  CT scan of August 23, 2016. FINDINGS: Cardiovascular: 4.8 cm ascending thoracic aortic aneurysm is noted. Atherosclerosis of thoracic aorta is noted. Filling defect is again noted in lower lobe branch of right pulmonary artery, and it is uncertain if this represents chronic or recurrent acute thrombus. Mild cardiomegaly is noted. No pericardial effusion is noted. Stable enlargement of pulmonary artery is noted suggesting pulmonary artery hypertension. Mediastinum/Nodes: No enlarged mediastinal, hilar, or axillary lymph nodes. Thyroid gland, trachea, and esophagus demonstrate no significant findings. Lungs/Pleura: No pneumothorax is noted. Moderate right pleural effusion is noted with adjacent subsegmental atelectasis. Left lung is clear. Upper Abdomen: Left lung is clear. Musculoskeletal: No chest wall abnormality. No acute or significant osseous findings. Review of the MIP images confirms the above findings. IMPRESSION: Filling defect is again noted in lower lobe branch of right pulmonary artery ; it is uncertain if this represents chronic or recurrent acute pulmonary embolus. Critical Value/emergent results were called by telephone at the time of interpretation on 01/11/2017 at 5:13 pm to Dr. Clayton Bibles , who verbally acknowledged these  results. Stable enlargement of pulmonary artery suggesting pulmonary artery hypertension. Moderate right pleural effusion is noted with adjacent subsegmental atelectasis. 4.8 cm ascending thoracic aortic aneurysm is noted. Ascending thoracic aortic aneurysm. Recommend semi-annual imaging followup by CTA or MRA and referral to cardiothoracic surgery if not already obtained. This recommendation follows 2010 ACCF/AHA/AATS/ACR/ASA/SCA/SCAI/SIR/STS/SVM Guidelines for the Diagnosis and Management of Patients With Thoracic Aortic Disease. Circulation. 2010; 121: D622-W979. Aortic Atherosclerosis (ICD10-I70.0). Electronically Signed   By: Marijo Conception, M.D.   On: 01/11/2017 17:13   Mr Jeri Cos GX Contrast  Result Date: 01/12/2017 CLINICAL DATA:  Followup hemorrhage. History of colon cancer, hypertension, atrial fibrillation, dementia. EXAM: MRI HEAD WITHOUT AND WITH CONTRAST TECHNIQUE: Multiplanar, multiecho pulse sequences of the brain and surrounding structures were obtained without and with intravenous contrast. CONTRAST:  70mL MULTIHANCE GADOBENATE DIMEGLUMINE 529 MG/ML IV SOLN COMPARISON:  None. FINDINGS: Moderately motion degraded examination, fast sequences utilized. INTRACRANIAL CONTENTS: Susceptibility artifact RIGHT basal ganglia corresponding to known hemorrhage with favoring of T2 bright vasogenic edema. Numerous central and peripheral foci of susceptibility artifact. Confluent susceptibility artifact RIGHT basal ganglia, LEFT thalamus and to lesser extent LEFT basal ganglia. 14 x 10 mm LEFT frontal lobe lesion with intrinsic T1 shortening, minimal enhancement. Ventricles and sulci are overall normal for patient's age, mild  ex vacuo dilatation RIGHT lateral ventricle associated with RIGHT basal ganglia infarct. Patchy to confluent supratentorial white matter T2 hyperintensities. Old tiny LEFT cerebellar infarct. No abnormal extra-axial fluid collections or abnormal extra-axial enhancement. No midline  shift, mass effect or suspicious enhancement . VASCULAR: Normal major intracranial vascular flow voids present at skull base. SKULL AND UPPER CERVICAL SPINE: No abnormal sellar expansion. No suspicious calvarial bone marrow signal. Craniocervical junction maintained. SINUSES/ORBITS: The mastoid air-cells and included paranasal sinuses are well-aerated.The included ocular globes and orbital contents are non-suspicious. Status post bilateral ocular lens implants. OTHER: None. IMPRESSION: Moderately motion degraded examination. Acute small RIGHT basal ganglia hemorrhage without mass effect. Subacute LEFT frontal lobe 14 x 10 mm hematoma. Extensive susceptibility artifact compatible with chronic hypertension though, there may be compartment amyloid angiopathy. 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 Moderate to severe chronic small vessel ischemic disease. Electronically Signed   By: Elon Alas M.D.   On: 01/12/2017 06:22   Dg Chest Portable 1 View  Result Date: 01/11/2017 CLINICAL DATA:  Chest pain and dyspnea EXAM: PORTABLE CHEST 1 VIEW COMPARISON:  08/22/2016 chest radiograph. FINDINGS: Stable cardiomediastinal silhouette with mild to moderate cardiomegaly and aortic atherosclerosis. No pneumothorax. No pleural effusion. No overt pulmonary edema. Mild curvilinear bibasilar opacities. IMPRESSION: 1. Stable cardiomegaly without overt pulmonary edema . 2. Hazy curvilinear bibasilar lung opacities, favor scarring or atelectasis. Electronically Signed   By: Ilona Sorrel M.D.   On: 01/11/2017 14:42   Assessment: Acute right basal ganglia hemorrhage.  1. MRI reveals Moderately motion degraded examination. Acute small RIGHT basal ganglia hemorrhage without mass effect. Subacute LEFT frontal lobe 14 x 10 mm hematoma. Extensive susceptibility artifact compatible with chronic hypertension though, there may be component of amyloid angiopathy. 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 Moderate to severe  chronic small vessel ischemic disease. 2. Atrial fibrillation and PE. On Eliquis.   Recommendations: 1. SBP goal of < 140.  2. PT/OT/Speech 3. From a Neurological standpoint, anticoagulation no longer indicated given that risks of recurrent hemorrhage while on Eliquis in the setting of probable amyloid angiopathy are most likely greater than the risk of ischemic stroke off anticoagulation. In the setting of PE, the best option may be placement of IVC filter followed by discontinuation of anticoagulation.  4. If stopping anticoagulation, ASA would have a lower risk of recurrent ICH but would confer some protection against ischemic stroke in the setting of atrial fibrillation and may be an acceptable alternative to anticoagulation from a Neurological standpoint. If starting ASA, would wait 2 weeks for the new ICH to stabilize.   Electronically signed: Dr. Kerney Elbe 01/12/2017, 7:41 PM

## 2017-01-12 NOTE — Evaluation (Signed)
Physical Therapy Evaluation Patient Details Name: Nicole [name] MRN: [id number] DOB: September 24, 1930 Today's Date: 01/12/2017   History of Present Illness  Nicole [name] is a 83 y.o. female with complicated past medical history significant for history of intracranial hemorrhage, Lt-sided weakness, vascular dementia, chronic atrial fibrillation and PE on Eliquis, history of GI bleed, hypertension, chronic kidney disease stage II-III, and hypothyroidism.  Patient is admitted 01/11/17 for evaluation of generalized weakness, lethargy, and respiratory distress.   Patient also with HTN.  MRI showed multiple areas of hemorrhage.     Clinical Impression  Patient presents with problems listed below.  Will benefit from acute PT to maximize functional mobility prior to discharge.  Recommend f/u HHPT at d/c to address decreased strength, balance, mobility, and gait.    Follow Up Recommendations Home health PT;Supervision/Assistance - 24 hour    Equipment Recommendations  None recommended by PT    Recommendations for Other Services       Precautions / Restrictions Precautions Precautions: Fall Precaution Comments: Lt-sided weakness Restrictions Weight Bearing Restrictions: No      Mobility  Bed Mobility Overal bed mobility: Modified Independent             General bed mobility comments: Increased time to complete.  Transfers Overall transfer level: Needs assistance Equipment used: Rolling walker (2 wheeled) Transfers: Sit to/from Stand Sit to Stand: Min guard         General transfer comment: Assist for safety only.  Verbal cues for hand placeent.  Moved sit <> stand x3.  In standing, marched in place > 25 steps with each LE x2.  Ambulation/Gait Ambulation/Gait assistance: Min guard Ambulation Distance (Feet): 2 Feet Assistive device: Rolling walker (2 wheeled) Gait Pattern/deviations: Step-to pattern;Decreased stride length;Shuffle Gait velocity: decreased Gait velocity  interpretation: Below normal speed for age/gender General Gait Details: Patient able to side-step toward [facility] with RW.  Stairs            Wheelchair Mobility    Modified Rankin (Stroke Patients Only) Modified Rankin (Stroke Patients Only) Pre-Morbid Rankin Score: Moderate disability Modified Rankin: Moderately severe disability     Balance Overall balance assessment: Needs assistance Sitting-balance support: No upper extremity supported;Feet supported Sitting balance-Leahy Scale: Good     Standing balance support: No upper extremity supported Standing balance-Leahy Scale: Fair                               Pertinent Vitals/Pain Pain Assessment: Faces Faces Pain Scale: Hurts a little bit Pain Location: Lt side of neck Pain Descriptors / Indicators: Sore Pain Intervention(s): Monitored during session;Repositioned    Home Living Family/patient expects to be discharged to:: Private residence Living Arrangements: Spouse/significant other;Children Available Help at Discharge: Family;Personal care attendant;Available 24 hours/day Type of Home: House Home Access: Stairs to enter Entrance Stairs-Rails: Right Entrance Stairs-Number of Steps: 4 Home Layout: One level Home Equipment: Walker - 2 wheels;Cane - single point;Shower seat      Prior Function Level of Independence: Independent         Comments: Ambulatory until 1 week pta.     Hand Dominance        Extremity/Trunk Assessment   Upper Extremity Assessment Upper Extremity Assessment: Overall WFL for tasks assessed;RUE deficits/detail RUE Deficits / Details: Slight decrease in strength to 4/5.    Lower Extremity Assessment Lower Extremity Assessment: Generalized weakness    Cervical / Trunk Assessment Cervical / Trunk Assessment: Kyphotic  Communication   Communication: No difficulties  Cognition Arousal/Alertness: Awake/alert Behavior During Therapy: WFL for tasks  assessed/performed Overall Cognitive Status: History of cognitive impairments - at baseline                                 General Comments: Oriented x2.  Knew the date, but not day or year.      General Comments      Exercises     Assessment/Plan    PT Assessment Patient needs continued PT services  PT Problem List Decreased strength;Decreased activity tolerance;Decreased balance;Decreased mobility;Decreased cognition;Decreased knowledge of use of DME;Cardiopulmonary status limiting activity;Pain       PT Treatment Interventions DME instruction;Gait training;Stair training;Functional mobility training;Therapeutic activities;Therapeutic exercise;Patient/family education    PT Goals (Current goals can be found in the Care Plan section)  Acute Rehab PT Goals Patient Stated Goal: To get to go home PT Goal Formulation: With patient/family Time For Goal Achievement: 01/19/17 Potential to Achieve Goals: Good    Frequency Min 3X/week   Barriers to discharge        Co-evaluation               AM-PAC PT "6 Clicks" Daily Activity  Outcome Measure Difficulty turning over in bed (including adjusting bedclothes, sheets and blankets)?: None Difficulty moving from lying on back to sitting on the side of the bed? : A Little Difficulty sitting down on and standing up from a chair with arms (e.g., wheelchair, bedside commode, etc,.)?: A Little Help needed moving to and from a bed to chair (including a wheelchair)?: A Little Help needed walking in hospital room?: A Little Help needed climbing 3-5 steps with a railing? : A Little 6 Click Score: 19    End of Session Equipment Utilized During Treatment: Gait belt;Oxygen Activity Tolerance: Patient tolerated treatment well Patient left: in bed;with call bell/phone within reach;with bed alarm set;with family/visitor present Nurse Communication: Mobility status PT Visit Diagnosis: Unsteadiness on feet (R26.81);Other  abnormalities of gait and mobility (R26.89);Muscle weakness (generalized) (M62.81);Pain Pain - Right/Left: Left Pain - part of body:  (Lt side of neck)    Time: 4259-5638 PT Time Calculation (min) (ACUTE ONLY): 35 min   Charges:   PT Evaluation $PT Eval Moderate Complexity: 1 Procedure PT Treatments $Therapeutic Activity: 8-22 mins   PT G Codes:   PT G-Codes **NOT FOR INPATIENT CLASS** Functional Assessment Tool Used: AM-PAC 6 Clicks Basic Mobility Functional Limitation: Mobility: Walking and moving around Mobility: Walking and Moving Around Current Status (V5643): At least 20 percent but less than 40 percent impaired, limited or restricted Mobility: Walking and Moving Around Goal Status [phone]): At least 1 percent but less than 20 percent impaired, limited or restricted    Carita Pian. Sanjuana Kava, [facility] Acute Rehab Services Pager [place] 01/12/2017, 7:14 PM

## 2017-01-12 NOTE — Progress Notes (Signed)
PROGRESS NOTE    Nicole [name]  ZCH:[id number] DOB: May 15, 1931 DOA: 01/11/2017 PCP: [name], DO    Brief Narrative:  83 y.o. female with complicated past medical history significant for history of intracranial hemorrhage, vascular dementia, chronic atrial fibrillation and PE on Eliquis, history of GI bleed, hypertension, chronic kidney disease stage II-III, and hypothyroidism, now presented to the emergency department at the direction of her PCP for evaluation of generalized weakness, lethargy, and respiratory distress.   Assessment & Plan:   Principal Problem: Basal ganglia hemorrhage - Patient has multiple areas of hemorrhage. I have consulted neurology and they will assist with further management. Patient should never be anticoagulated given that she has a propensity for bleeding into her brain. - Tight blood pressure control with PRN hydralazine for SBP > 140.    Generalized weakness - The patient has many serious active medical problems which make her prognosis poor. I discussed with family. Patient has several intracerebral hemorrhages. Please refer to MRI for details. Neurosurgery consulted and recommended holding eliquis. The problem there is that patient has PE and atrial fibrillation which puts her at risk for mortality and comorbidity as a result. This was also discussed with family and I recommended obtaining a palliative care consult for goals of care. Family were okay with this.  Active Problems:   Anemia - Resolved on repeat blood tests   hypokalemia - Replace orally and reassess    Thoracic aortic aneurysm, 4.8cm 2011   HTN (hypertension) - Pt is on clonidine, imdur, lisinopril, metoprolol    Acute respiratory distress - resolved on supplemental oxygen    ICH (intracerebral hemorrhage) ([place]) - Was discussed with neurosurgeon and neurology - Eliquis discontinued    Chronic atrial fibrillation ([place]) - Patient is no longer anticoagulation candidate -  Rate controlled on metoprolol    Hypertensive urgency - Continue home blood pressure medications and will add when necessary hydralazine    Type 2 diabetes mellitus with peripheral neuropathy (HCC)   Vascular dementia   Hypothyroidism due to acquired atrophy of thyroid - continue synthroid   Pulmonary embolus ([facility]) - no longer anticoagulant candidate - palliative care for goals of care    General weakness   DVT prophylaxis: The patient is not a anticoagulation candidate and has history of PE do not feel comfortable placing SCDs until we can rule out DVTs  Code Status: DO NOT RESUSCITATE  Family Communication: discussed with sister and daughter  Disposition Plan: palliative care consult    Consultants:   Neurology   Procedures: None   Antimicrobials:None   Subjective: Pt has no new complaints no acute issues overnight.  Objective: Vitals:   01/12/17 0347 01/12/17 0859 01/12/17 0900 01/12/17 1400  BP: (!) 179/98 (!) 186/97  (!) 153/80  Pulse: 68  (!) 107 88  Resp: 15   20  Temp: 97.7 F (36.5 C)   98 F (36.7 C)  TempSrc:    Oral  SpO2: 100% 95%  99%  Weight: 66.8 kg (147 lb 3.2 oz)     Height:        Intake/Output Summary (Last 24 hours) at 01/12/17 1730 Last data filed at 01/12/17 0630  Gross per 24 hour  Intake              723 ml  Output              225 ml  Net  498 ml   Filed Weights   01/11/17 1358 01/11/17 2108 01/12/17 0347  Weight: 66.2 kg (146 lb) 66.5 kg (146 lb 8 oz) 66.8 kg (147 lb 3.2 oz)    Examination:  General exam: Appears calm and comfortable, in nad. Respiratory system: No increased work of breathing, no wheezes  Cardiovascular system: irregular rate and rhythm, no murmurs  Telemetry: CV strip showing A. fib  Gastrointestinal system: Abdomen is nondistended, soft and nontender. No organomegaly or masses felt. Normal bowel sounds heard. Central nervous system: Alert and Awake, no facial asymmetry Extremities:  Symmetric 5 x 5 power. Skin: No rashes, lesions or ulcers, on limited exam. Psychiatry:  Mood & affect appropriate.     Data Reviewed: I have personally reviewed following labs and imaging studies  CBC:  Recent Labs Lab 01/11/17 1405 01/12/17 0700  WBC 5.8 7.2  NEUTROABS  --  4.7  HGB 11.6* 13.0  HCT 33.9* 39.7  MCV 68.9* 70.3*  PLT 234 496   Basic Metabolic Panel:  Recent Labs Lab 01/11/17 1405 01/12/17 0700  NA 139 138  K 3.4* 3.4*  CL 104 103  CO2 26 23  GLUCOSE 127* 136*  BUN 12 12  CREATININE 1.07* 1.07*  CALCIUM 9.0 8.7*  MG  --  1.6*   GFR: Estimated Creatinine Clearance: 40.2 mL/min (A) (by C-G formula based on SCr of 1.07 mg/dL (H)). Liver Function Tests: No results for input(s): AST, ALT, ALKPHOS, BILITOT, PROT, ALBUMIN in the last 168 hours. No results for input(s): LIPASE, AMYLASE in the last 168 hours.  Recent Labs Lab 01/11/17 2123  AMMONIA 24   Coagulation Profile: No results for input(s): INR, PROTIME in the last 168 hours. Cardiac Enzymes:  Recent Labs Lab 01/11/17 2123 01/12/17 0055 01/12/17 0700  TROPONINI <0.03 <0.03 <0.03   BNP (last 3 results) No results for input(s): PROBNP in the last 8760 hours. HbA1C: No results for input(s): HGBA1C in the last 72 hours. CBG:  Recent Labs Lab 01/11/17 1949 01/11/17 2125 01/12/17 0731 01/12/17 1147 01/12/17 1613  GLUCAP 94 98 145* 171* 191*   Lipid Profile: No results for input(s): CHOL, HDL, LDLCALC, TRIG, CHOLHDL, LDLDIRECT in the last 72 hours. Thyroid Function Tests:  Recent Labs  01/11/17 2123  TSH 0.244*   Anemia Panel:  Recent Labs  01/11/17 2123  VITAMINB12 265  FERRITIN 21  TIBC 305  IRON 32   Sepsis Labs:  Recent Labs Lab 01/11/17 1505 01/11/17 1932  LATICACIDVEN 1.49 1.27    No results found for this or any previous visit (from the past 240 hour(s)).       Radiology Studies: Ct Head Wo Contrast  Addendum Date: 01/12/2017   ADDENDUM  REPORT: 01/12/2017 00:00 ADDENDUM: Critical Value/emergent results were called by telephone at the time of interpretation on 01/12/2017 at 12:00 am to NP Walden Field , who verbally acknowledged these results. Electronically Signed   By: Elon Alas M.D.   On: 01/12/2017 00:00   Result Date: 01/12/2017 CLINICAL DATA:  Weakness. History of colon cancer, intracranial hemorrhage, atrial fibrillation, diabetes, hypertension. EXAM: CT HEAD WITHOUT CONTRAST TECHNIQUE: Contiguous axial images were obtained from the base of the skull through the vertex without intravenous contrast. COMPARISON:  CT HEAD July 20, 2016 FINDINGS: BRAIN: 12 x 13 x 14 mm (volume = 1100 mm^3) acute RIGHT basal ganglia hemorrhage, minimal surrounding vasogenic edema. No significant mass effect. Faintly dense 12 x 13 mm LEFT frontal cortical to subcortical lesion, new from prior CT.  Ventricles and sulci are normal for patient's age. Confluent supratentorial white matter hypodensities. Old basal ganglia and thalamus lacunar infarcts. No acute large vascular territory infarct. No abnormal extra-axial fluid collections. VASCULAR: Moderate calcific atherosclerosis of the carotid siphons. SKULL: No skull fracture. No significant scalp soft tissue swelling. SINUSES/ORBITS: Chronic sphenoid sinusitis, hypoplastic RIGHT sphenoid. Mastoid air cells are well aerated.The included ocular globes and orbital contents are non-suspicious. OTHER: None. IMPRESSION: Acute 12 x 13 x 14 mm RIGHT basal ganglia hemorrhage without significant mass effect. 12 x 13 mm faintly dense LEFT frontal lobe lesion could reflect subacute hemorrhage or, metastasis. MRI of the brain with contrast is recommended. Moderate to severe chronic small vessel ischemic disease and old lacunar infarcts. Electronically Signed: By: Elon Alas M.D. On: 01/11/2017 23:53   Ct Angio Chest Pe W/cm &/or Wo Cm  Result Date: 01/11/2017 CLINICAL DATA:  Chest pain, shortness of breath.  EXAM: CT ANGIOGRAPHY CHEST WITH CONTRAST TECHNIQUE: Multidetector CT imaging of the chest was performed using the standard protocol during bolus administration of intravenous contrast. Multiplanar CT image reconstructions and MIPs were obtained to evaluate the vascular anatomy. CONTRAST:  100 mL of Isovue 370 intravenously. COMPARISON:  CT scan of August 23, 2016. FINDINGS: Cardiovascular: 4.8 cm ascending thoracic aortic aneurysm is noted. Atherosclerosis of thoracic aorta is noted. Filling defect is again noted in lower lobe branch of right pulmonary artery, and it is uncertain if this represents chronic or recurrent acute thrombus. Mild cardiomegaly is noted. No pericardial effusion is noted. Stable enlargement of pulmonary artery is noted suggesting pulmonary artery hypertension. Mediastinum/Nodes: No enlarged mediastinal, hilar, or axillary lymph nodes. Thyroid gland, trachea, and esophagus demonstrate no significant findings. Lungs/Pleura: No pneumothorax is noted. Moderate right pleural effusion is noted with adjacent subsegmental atelectasis. Left lung is clear. Upper Abdomen: Left lung is clear. Musculoskeletal: No chest wall abnormality. No acute or significant osseous findings. Review of the MIP images confirms the above findings. IMPRESSION: Filling defect is again noted in lower lobe branch of right pulmonary artery ; it is uncertain if this represents chronic or recurrent acute pulmonary embolus. Critical Value/emergent results were called by telephone at the time of interpretation on 01/11/2017 at 5:13 pm to Dr. Clayton Bibles , who verbally acknowledged these results. Stable enlargement of pulmonary artery suggesting pulmonary artery hypertension. Moderate right pleural effusion is noted with adjacent subsegmental atelectasis. 4.8 cm ascending thoracic aortic aneurysm is noted. Ascending thoracic aortic aneurysm. Recommend semi-annual imaging followup by CTA or MRA and referral to cardiothoracic surgery  if not already obtained. This recommendation follows 2010 ACCF/AHA/AATS/ACR/ASA/SCA/SCAI/SIR/STS/SVM Guidelines for the Diagnosis and Management of Patients With Thoracic Aortic Disease. Circulation. 2010; 121: U202-R427. Aortic Atherosclerosis (ICD10-I70.0). Electronically Signed   By: Marijo Conception, M.D.   On: 01/11/2017 17:13   Mr Jeri Cos CW Contrast  Result Date: 01/12/2017 CLINICAL DATA:  Followup hemorrhage. History of colon cancer, hypertension, atrial fibrillation, dementia. EXAM: MRI HEAD WITHOUT AND WITH CONTRAST TECHNIQUE: Multiplanar, multiecho pulse sequences of the brain and surrounding structures were obtained without and with intravenous contrast. CONTRAST:  52mL MULTIHANCE GADOBENATE DIMEGLUMINE 529 MG/ML IV SOLN COMPARISON:  None. FINDINGS: Moderately motion degraded examination, fast sequences utilized. INTRACRANIAL CONTENTS: Susceptibility artifact RIGHT basal ganglia corresponding to known hemorrhage with favoring of T2 bright vasogenic edema. Numerous central and peripheral foci of susceptibility artifact. Confluent susceptibility artifact RIGHT basal ganglia, LEFT thalamus and to lesser extent LEFT basal ganglia. 14 x 10 mm LEFT frontal lobe lesion with intrinsic T1 shortening,  minimal enhancement. Ventricles and sulci are overall normal for patient's age, mild ex vacuo dilatation RIGHT lateral ventricle associated with RIGHT basal ganglia infarct. Patchy to confluent supratentorial white matter T2 hyperintensities. Old tiny LEFT cerebellar infarct. No abnormal extra-axial fluid collections or abnormal extra-axial enhancement. No midline shift, mass effect or suspicious enhancement . VASCULAR: Normal major intracranial vascular flow voids present at skull base. SKULL AND UPPER CERVICAL SPINE: No abnormal sellar expansion. No suspicious calvarial bone marrow signal. Craniocervical junction maintained. SINUSES/ORBITS: The mastoid air-cells and included paranasal sinuses are  well-aerated.The included ocular globes and orbital contents are non-suspicious. Status post bilateral ocular lens implants. OTHER: None. IMPRESSION: Moderately motion degraded examination. Acute small RIGHT basal ganglia hemorrhage without mass effect. Subacute LEFT frontal lobe 14 x 10 mm hematoma. Extensive susceptibility artifact compatible with chronic hypertension though, there may be compartment amyloid angiopathy. 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 Moderate to severe chronic small vessel ischemic disease. Electronically Signed   By: Elon Alas M.D.   On: 01/12/2017 06:22   Dg Chest Portable 1 View  Result Date: 01/11/2017 CLINICAL DATA:  Chest pain and dyspnea EXAM: PORTABLE CHEST 1 VIEW COMPARISON:  08/22/2016 chest radiograph. FINDINGS: Stable cardiomediastinal silhouette with mild to moderate cardiomegaly and aortic atherosclerosis. No pneumothorax. No pleural effusion. No overt pulmonary edema. Mild curvilinear bibasilar opacities. IMPRESSION: 1. Stable cardiomegaly without overt pulmonary edema . 2. Hazy curvilinear bibasilar lung opacities, favor scarring or atelectasis. Electronically Signed   By: Ilona Sorrel M.D.   On: 01/11/2017 14:42        Scheduled Meds: . allopurinol  100 mg Oral Daily  . cloNIDine  0.2 mg Oral TID  . donepezil  10 mg Oral QHS  . hydrALAZINE  50 mg Oral BID  . isosorbide mononitrate  30 mg Oral Daily  . levothyroxine  50 mcg Oral QAC breakfast  . lisinopril  5 mg Oral Daily  . metoprolol tartrate  50 mg Oral BID  . sodium chloride flush  3 mL Intravenous Q12H   Continuous Infusions:   LOS: 0 days    Time spent: > 35 minutes  Velvet Bathe, MD [facility] Pager [phone]  If 7PM-7AM, please contact night-coverage [email or web address] Password [facility] 01/12/2017, 5:30 PM

## 2017-01-12 NOTE — Progress Notes (Signed)
Patient had a 2.14 seconds pause at 0255 per CCMD. Patient is asymptomatic. Will continue to monitor.

## 2017-01-13 DIAGNOSIS — Z8679 Personal history of other diseases of the circulatory system: Secondary | ICD-10-CM

## 2017-01-13 DIAGNOSIS — E1142 Type 2 diabetes mellitus with diabetic polyneuropathy: Secondary | ICD-10-CM

## 2017-01-13 DIAGNOSIS — I610 Nontraumatic intracerebral hemorrhage in hemisphere, subcortical: Secondary | ICD-10-CM

## 2017-01-13 DIAGNOSIS — Z515 Encounter for palliative care: Secondary | ICD-10-CM

## 2017-01-13 DIAGNOSIS — Z66 Do not resuscitate: Secondary | ICD-10-CM

## 2017-01-13 LAB — BASIC METABOLIC PANEL
Anion gap: 6 (ref 5–15)
BUN: 12 mg/dL (ref 6–20)
CO2: 27 mmol/L (ref 22–32)
Calcium: 8.8 mg/dL — ABNORMAL LOW (ref 8.9–10.3)
Chloride: 107 mmol/L (ref 101–111)
Creatinine, Ser: 1.24 mg/dL — ABNORMAL HIGH (ref 0.44–1.00)
GFR calc Af Amer: 45 mL/min — ABNORMAL LOW (ref >60)
GFR, EST NON AFRICAN AMERICAN: 39 mL/min — AB (ref >60)
GLUCOSE: 212 mg/dL — AB (ref 65–99)
POTASSIUM: 3.4 mmol/L — AB (ref 3.5–5.1)
Sodium: 140 mmol/L (ref 135–145)

## 2017-01-13 LAB — GLUCOSE, CAPILLARY
GLUCOSE-CAPILLARY: 116 mg/dL — AB (ref 65–99)
GLUCOSE-CAPILLARY: 172 mg/dL — AB (ref 65–99)
GLUCOSE-CAPILLARY: 157 mg/dL — AB (ref 65–99)
GLUCOSE-CAPILLARY: 133 mg/dL — AB (ref 65–99)

## 2017-01-13 LAB — URINE CULTURE

## 2017-01-13 MED ORDER — HYDRALAZINE HCL 20 MG/ML IJ SOLN
10.0000 mg | INTRAMUSCULAR | Status: DC | PRN
  Administered 2017-01-13 – 2017-01-17 (×3): 10 mg via INTRAVENOUS
  Filled 2017-01-13 (×3): qty 1

## 2017-01-13 MED ORDER — LISINOPRIL 20 MG PO TABS
20.0000 mg | ORAL_TABLET | Freq: Every day | ORAL | Status: DC
  Administered 2017-01-13 – 2017-01-18 (×6): 20 mg via ORAL
  Filled 2017-01-13 (×6): qty 1

## 2017-01-13 MED ORDER — HYDRALAZINE HCL 50 MG PO TABS
50.0000 mg | ORAL_TABLET | Freq: Three times a day (TID) | ORAL | Status: DC
  Administered 2017-01-13 – 2017-01-16 (×9): 50 mg via ORAL
  Filled 2017-01-13 (×9): qty 1

## 2017-01-13 MED ORDER — LABETALOL HCL 5 MG/ML IV SOLN: 10.0000 mg | INTRAVENOUS | Status: DC | PRN

## 2017-01-13 MED ORDER — LABETALOL HCL 5 MG/ML IV SOLN
10.0000 mg | INTRAVENOUS | Status: DC | PRN
  Administered 2017-01-13: 20 mg via INTRAVENOUS
  Administered 2017-01-13 – 2017-01-17 (×2): 10 mg via INTRAVENOUS
  Filled 2017-01-13 (×3): qty 4

## 2017-01-13 NOTE — Consult Note (Signed)
Consultation Note Date: 01/13/2017   Patient Name: Nicole [name]  DOB: [birth year]  MRN: [id number]  Age / Sex: 83 y.o., female  PCP: [name], DO Referring Physician: Velvet Bathe, MD  Reason for Consultation: Establishing goals of care and Psychosocial/spiritual support  HPI/Patient Profile: 83 y.o. female admitted on 01/11/2017 with past medical history significant for history of intracranial hemorrhage, vascular dementia, chronic atrial fibrillation and PE on Eliquis, history of GI bleed, hypertension, chronic kidney disease stage II-III, and hypothyroidism, now presented to the emergency department at the direction of her PCP for evaluation of generalized weakness, lethargy, and respiratory distress.  Patient has had continued physical and functional decline over the past months, mild to moderate dementia  She lives at home with her SO Nicole [name]/elderly himself and help of all family members and hired caregivers  Work-up from neurology significant for:   -ICH:  bilateral ICH with right thalamus acute and left frontal subacute, likely due to high BP in the setting of eliquis for PE treatment.  - Blood pressure remains difficult to control  Patient is frail and with her multiple comorbid ites she is high risk for decompensation.  Family face advanced directive decisions and anticipatory care needs.   Clinical Assessment and Goals of Care:  This NP Wadie Lessen reviewed medical records, received report from team, assessed the patient and then meet at the patient's bedside along with her three children and SO  to discuss diagnosis, prognosis, GOC, EOL wishes disposition and options.  A detailed discussion was had today regarding advanced directives.  Concepts specific to code status, artifical feeding and hydration, continued IV antibiotics and rehospitalization was had.  The difference  between a aggressive medical intervention path  and a palliative comfort care path for this patient at this time was had.  I expressed my concerns that 2/2 to her multiple co-morbidites she is high risk for decompensation and the care needs in the home will likely be more demanding than prior to this admission.   Values and goals of care important to patient and family were attempted to be elicited.  MOST form introduced  Concept of Hospice and Palliative Care were discussed  Natural trajectory and expectations at EOL were discussed.  Questions and concerns addressed.   Family encouraged to call with questions or concerns.  PMT will continue to support holistically.  Family that is present today tell me that an Ashley Mariner is her HPOA  # [phone] ( she is not listed as a contact).  I did place a call and left a message  ( spoke to late in the afternoon and Arnnetta clarifies that daughter/Nicole [name] is documented HPOA)  SUMMARY OF RECOMMENDATIONS    Code Status/Advance Care Planning:  DNR   Symptom Management:   Weakness: family hopeful for continued PT and home PT on discharge   Palliative Prophylaxis:   Aspiration, Bowel Regimen, Delirium Protocol, Frequent Pain Assessment and Oral Care  Additional Recommendations (Limitations, Scope, Preferences):  Full Scope Treatment  Psycho-social/Spiritual:  Desire for further Chaplaincy support:no  Additional Recommendations: Education on Hospice  Prognosis:   < 6 months  Discharge Planning:  Family tell me that the plan is home with Home Health, I recommended Pallaitive services at a minimum and encouraged them to consider hospice services.  I believe Ms. Streight is hospice eligible.     Primary Diagnoses: Present on Admission: . Thoracic aortic aneurysm, 4.8cm 2011 . HTN (hypertension) . Anemia . Chronic atrial fibrillation ([place]) . Chronic anemia . Type 2 diabetes mellitus with peripheral neuropathy (HCC) .  Pulmonary embolus ([place]) . Hypokalemia . (Resolved) Hypothyroidism . SOB (shortness of breath) . ICH (intracerebral hemorrhage) ([place]) . Vascular dementia . Hypothyroidism due to acquired atrophy of thyroid . Hypertensive urgency . Acute respiratory distress . Bradyarrhythmia   I have reviewed the medical record, interviewed the patient and family, and examined the patient. The following aspects are pertinent.  Past Medical History:  Diagnosis Date  . AAA (abdominal aortic aneurysm) ([place])   . Acute upper respiratory infections of unspecified site   . Anemia   . Anxiety   . Aortic aneurysm of unspecified site without mention of rupture   . Arthritis   . Atrial fibrillation ([place])   . Cervicalgia   . Cholelithiasis   . Chronic systolic heart failure ([place])   . CKD (chronic kidney disease)   . Colon cancer ([place]) 07/1997  . Depression   . Diabetes mellitus ([place])   . Dizziness and giddiness   . Electrolyte and fluid disorders not elsewhere classified   . GERD (gastroesophageal reflux disease)   . Gout, unspecified   . Hemorrhage of rectum and anus   . Hypercholesteremia   . Hypertension   . Hypoglycemia, unspecified   . Hypopotassemia   . Hypothyroidism   . Kidney stone    renal calculi  . Malignant neoplasm of colon, unspecified site   . Other chronic allergic conjunctivitis   . Other specified cardiac dysrhythmias(427.89)   . Pain in joint, lower leg   . Perforation of bile duct   . Proteinuria   . Shortness of breath   . Small bowel obstruction due to adhesions ([place]) 05/16/2012  . Stroke ([place])   . Swelling, mass, or lump in head and neck   . Thoracic aneurysm without mention of rupture    4.6 cm Asc Aortic aneurysm, CT 07/2015   Social History   Social History  . Marital status: Married    Spouse name: N/A  . Number of children: N/A  . Years of education: N/A   Social History Main Topics  . Smoking status: Never Smoker  . Smokeless tobacco: Never Used  .  Alcohol use No  . Drug use: No  . Sexual activity: No   Other Topics Concern  . None   Social History Narrative  . None   Family History  Problem Relation Age of Onset  . Heart disease Mother   . Stroke Father   . Hypertension Daughter   . Hypertension Son   . Diabetes Sister   . Hypertension Son    Scheduled Meds: . allopurinol  100 mg Oral Daily  . cloNIDine  0.2 mg Oral TID  . donepezil  10 mg Oral QHS  . hydrALAZINE  50 mg Oral Q8H  . isosorbide mononitrate  30 mg Oral Daily  . levothyroxine  50 mcg Oral QAC breakfast  . lisinopril  20 mg Oral Daily  . metoprolol tartrate  50 mg Oral BID  .  sodium chloride flush  3 mL Intravenous Q12H   Continuous Infusions: PRN Meds:.acetaminophen **OR** acetaminophen, albuterol, bisacodyl, hydrALAZINE, HYDROcodone-acetaminophen, labetalol, ondansetron **OR** ondansetron (ZOFRAN) IV, polyethylene glycol Medications Prior to Admission:  Prior to Admission medications   Medication Sig Start Date End Date Taking? Authorizing Provider  allopurinol (ZYLOPRIM) 100 MG tablet TAKE 1 TABLET BY MOUTH  DAILY Patient taking differently: Take 100 mg by mouth once a day 12/24/16  Yes [name], [name], DO  apixaban (ELIQUIS) 5 MG TABS tablet Take 1 tablet (5 mg total) by mouth 2 (two) times daily. 09/03/16  Yes [name], [name], DO  cloNIDine (CATAPRES) 0.2 MG tablet take 1 tablet by mouth three times a day for hypertension Patient taking differently: Take 0.2 mg by mouth 3 (three) times daily. FOR HYPERTENSION 09/03/16  Yes [name], [name], DO  donepezil (ARICEPT) 10 MG tablet TAKE 1 TABLET BY MOUTH AT  BEDTIME Patient taking differently: Take 10 mg by mouth at bedtime 11/19/16  Yes Gildardo Cranker, DO  furosemide (LASIX) 20 MG tablet Take 1 tablet (20 mg) by mouth every other day, may also take one additional tablet daily for weight gain greater than 3 lbs. Patient taking differently: Take 20 mg by mouth every other day. And may also take one additional  tablet daily for weight gain greater than 3 lbs. 09/03/16  Yes [name], [name], DO  hydrALAZINE (APRESOLINE) 50 MG tablet Take 1 tablet (50 mg total) by mouth 3 (three) times daily. Patient taking differently: Take 50 mg by mouth 2 (two) times daily.  04/12/16  Yes Gherghe, Vella Redhead, MD  isosorbide mononitrate (IMDUR) 30 MG 24 hr tablet TAKE 1 TABLET BY MOUTH  DAILY Patient taking differently: Take 30 mg by mouth once a day 08/27/16  Yes [name], [name], DO  levothyroxine (SYNTHROID, LEVOTHROID) 50 MCG tablet TAKE 1 TABLET BY MOUTH  DAILY BEFORE BREAKFAST Patient taking differently: Take 50 mcg by mouth daily before breakfast 10/08/16  Yes [name], [name], DO  lisinopril (PRINIVIL,ZESTRIL) 5 MG tablet Take 1 tablet (5 mg total) by mouth daily. 08/27/16  Yes Arrien, Jimmy Picket, MD  metoprolol (LOPRESSOR) 100 MG tablet TAKE 1 TABLET BY MOUTH TWO  TIMES DAILY Patient taking differently: Take 100 mg by mouth two times a day 11/19/16  Yes Eulas Post, Monica, DO  potassium chloride (K-DUR) 10 MEQ tablet Take 1 tablet (10 mEq total) by mouth every other day. 09/03/16  Yes [name], [name], DO   Allergies  Allergen Reactions  . Iohexol Nausea And Vomiting  . Iodinated Diagnostic Agents Nausea And Vomiting  . Z-Pak [Azithromycin]     Reaction unknown by family   Review of Systems  Constitutional: Positive for fatigue.    Physical Exam  Constitutional: She appears lethargic. She appears ill.  -frail and elderly  Cardiovascular: Normal rate, regular rhythm and normal heart sounds.   Pulmonary/Chest: She has decreased breath sounds in the right lower field and the left lower field.  Neurological: She appears lethargic.  Skin: Skin is warm and dry.    Vital Signs: BP (!) 163/109   Pulse 86   Temp 98 F (36.7 C)   Resp (!) 24   Ht 5\' 8"  (1.727 m)   Wt 66.8 kg (147 lb 3.2 oz)   SpO2 100%   BMI 22.38 kg/m  Pain Assessment: No/denies pain   Pain Score: Asleep   SpO2: SpO2: 100 % O2 Device:SpO2:  100 % O2 Flow Rate: .O2 Flow Rate (L/min): 2  L/min  IO: Intake/output summary:  Intake/Output Summary (Last 24 hours) at 01/13/17 1201 Last data filed at 01/13/17 0853  Gross per 24 hour  Intake                0 ml  Output                0 ml  Net                0 ml    LBM:   Baseline Weight: Weight: 66.2 kg (146 lb) Most recent weight: Weight: 66.8 kg (147 lb 3.2 oz)     Palliative Assessment/Data:  30 % at best      Discussed with Dr Wendee Beavers  Time In: 1300 Time Out: 1445 Time Total: 75 min Greater than 50%  of this time was spent counseling and coordinating care related to the above assessment and plan.  Signed by: Wadie Lessen, NP   Please contact Palliative Medicine Team phone at [phone] for questions and concerns.  For individual provider: See Shea Evans

## 2017-01-13 NOTE — Evaluation (Signed)
Clinical/Bedside Swallow Evaluation Patient Details  Name: Nicole [name] MRN: [id number] Date of Birth: [birth year]  Today's Date: 01/13/2017 Time: SLP Start Time (ACUTE ONLY): 1093 SLP Stop Time (ACUTE ONLY): 0955 SLP Time Calculation (min) (ACUTE ONLY): 21 min  Past Medical History:  Past Medical History:  Diagnosis Date  . AAA (abdominal aortic aneurysm) ([place])   . Acute upper respiratory infections of unspecified site   . Anemia   . Anxiety   . Aortic aneurysm of unspecified site without mention of rupture   . Arthritis   . Atrial fibrillation ([place])   . Cervicalgia   . Cholelithiasis   . Chronic systolic heart failure ([place])   . CKD (chronic kidney disease)   . Colon cancer ([place]) 07/1997  . Depression   . Diabetes mellitus ([place])   . Dizziness and giddiness   . Electrolyte and fluid disorders not elsewhere classified   . GERD (gastroesophageal reflux disease)   . Gout, unspecified   . Hemorrhage of rectum and anus   . Hypercholesteremia   . Hypertension   . Hypoglycemia, unspecified   . Hypopotassemia   . Hypothyroidism   . Kidney stone    renal calculi  . Malignant neoplasm of colon, unspecified site   . Other chronic allergic conjunctivitis   . Other specified cardiac dysrhythmias(427.89)   . Pain in joint, lower leg   . Perforation of bile duct   . Proteinuria   . Shortness of breath   . Small bowel obstruction due to adhesions ([place]) 05/16/2012  . Stroke ([place])   . Swelling, mass, or lump in head and neck   . Thoracic aneurysm without mention of rupture    4.6 cm Asc Aortic aneurysm, CT 07/2015   Past Surgical History:  Past Surgical History:  Procedure Laterality Date  . CARDIAC CATHETERIZATION  2008   moderate severe pulm HTN; with elevted pulm capillary wedge pressure   . CHOLECYSTECTOMY  05/14/2012   Procedure: LAPAROSCOPIC CHOLECYSTECTOMY WITH INTRAOPERATIVE CHOLANGIOGRAM;  Surgeon: Haywood Lasso, MD;  Location: [facility];  Service: General;   Laterality: N/A;  . COLON SURGERY     to remove colon ca  . ERCP  05/17/2012   Procedure: ENDOSCOPIC RETROGRADE CHOLANGIOPANCREATOGRAPHY (ERCP);  Surgeon: Missy Sabins, MD;  Location: [facility];  Service: Gastroenterology;  Laterality: N/A;  . ERCP  08/12/2012   Procedure: ENDOSCOPIC RETROGRADE CHOLANGIOPANCREATOGRAPHY (ERCP);  Surgeon: Missy Sabins, MD;  Location: [facility] ENDOSCOPY;  Service: Endoscopy;  Laterality: N/A;  . HEMICOLECTOMY  08/11/1997  . KIDNEY STONE SURGERY    . TRANSTHORACIC ECHOCARDIOGRAM  11/2009   EF 45-50%, mild-mod AV regurg; mild MR; mod TR; ascending aorta mildly dilated   HPI:  Nicole Nicole Butleris a 83 y.o.femalewith complicated past medical history significant for history of intracranial hemorrhage, vascular dementia, chronic atrial fibrillation and PE on Eliquis, history of GI bleed, hypertension, chronic kidney disease stage II-III, and hypothyroidism, now presented to the emergency department at the direction of her PCP for evaluation of generalized weakness, lethargy, and respiratory distress. Patient is accompanied by family who assist with the history. She had reportedly been ambulatory until approximately one week ago. Since that time, she has been increasingly weak generally, and lethargic per her family. She has had poor appetite over this interval. Patient does not voice any complaints. She lives at home with caretakers. Today, she was so weak, that she had to be lifted out of the bed to go see her PCP. There has not been  any fevers reported, no vomiting or diarrhea, no fall or trauma, and the patient was not noted to have any apparent respiratory distress until she was at her PCPs office today for evaluation of lethargy. She was noted to be tachypneic with labored breathing, but good O2 saturations. She was directed to the ED for further evaluation of this.  Most recent MRI of the head is showing small acute right basal ganglia hemorrhage wtihout mass effect, subacute left  frontal lobe hematoma and old hemorrhage right basla ganglia infarct.  Patient with previous ST services following [place] with previous MBS dated 04/11/2015 with recommendation for a dysphagia 3 diet with thin liquids.    Assessment / Plan / Recommendation Clinical Impression  Clinical swallowing evaluation was completed using thin liquids, pureed material and dry solids.  Oral mechanism exam was completed and unremarkable.  Of note, the patient had previous MBS dated 04/11/2015 with recommendation for a dysphagia 3 diet with thin liquids with full supervision.  The patient was found to have a moderate oral and mild pharyngeal dysphagia.   Given intake today the patient appeared to have an oropharyngeal dysphagia characterized by delayed oral transit as well as what appeared to be some oral holding and delayed swallow trigger.  No oral residue was noted following dry solids.  Laryngeal elevation was appreciated.  Patient noted to have positive s/s of aspiration given serial sips of thin liquids via straws sips.  Given one sip at a  time this was not evident.   Recommend a regular diet with thin liquids.  The patient should NOT use straws and should take ONE SIP at a time.  Medications whole in pureed material.  ST to follow up for therapeutic diet tolerance vs need for repeat objective measure.   SLP Visit Diagnosis: Dysphagia, oropharyngeal phase (R13.12)    Aspiration Risk  Mild aspiration risk    Diet Recommendation   Regular with thin liquids - NO STRAWS - ONE SIP AT A TIME  Medication Administration: Whole meds with puree    Other  Recommendations Oral Care Recommendations: Oral care BID   Follow up Recommendations Other (comment) (TBD)      Frequency and Duration min 2x/week          Prognosis Prognosis for Safe Diet Advancement: Good Barriers to Reach Goals: Cognitive deficits      Swallow Study   General Date of Onset: 01/11/17 HPI: Nicole Berndt Butleris a 83 y.o.femalewith  complicated past medical history significant for history of intracranial hemorrhage, vascular dementia, chronic atrial fibrillation and PE on Eliquis, history of GI bleed, hypertension, chronic kidney disease stage II-III, and hypothyroidism, now presented to the emergency department at the direction of her PCP for evaluation of generalized weakness, lethargy, and respiratory distress. Patient is accompanied by family who assist with the history. She had reportedly been ambulatory until approximately one week ago. Since that time, she has been increasingly weak generally, and lethargic per her family. She has had poor appetite over this interval. Patient does not voice any complaints. She lives at home with caretakers. Today, she was so weak, that she had to be lifted out of the bed to go see her PCP. There has not been any fevers reported, no vomiting or diarrhea, no fall or trauma, and the patient was not noted to have any apparent respiratory distress until she was at her PCPs office today for evaluation of lethargy. She was noted to be tachypneic with labored breathing, but good O2 saturations.  She was directed to the ED for further evaluation of this.  Most recent MRI of the head is showing small acute right basal ganglia hemorrhage wtihout mass effect, subacute left frontal lobe hematoma and old hemorrhage right basla ganglia infarct.  Patient with previous ST services following [place] with previous MBS dated 04/11/2015 with recommendation for a dysphagia 3 diet with thin liquids.  Type of Study: Bedside Swallow Evaluation Previous Swallow Assessment: 04/11/2015 with recommendation for Dysphagia 3 with thin liquids.  Full supervision.   Diet Prior to this Study: NPO Temperature Spikes Noted: No Respiratory Status: Room air History of Recent Intubation: No Behavior/Cognition: Alert;Cooperative Oral Cavity Assessment: Within Functional Limits Oral Care Completed by SLP: No Oral Cavity - Dentition:  Dentures, top;Dentures, bottom Vision: Functional for self-feeding Self-Feeding Abilities: Needs assist Patient Positioning: Upright in bed Baseline Vocal Quality: Normal Volitional Cough: Strong Volitional Swallow: Able to elicit    Oral/Motor/Sensory Function Overall Oral Motor/Sensory Function: Within functional limits   Ice Chips Ice chips: Not tested   Thin Liquid Thin Liquid: Impaired Presentation: Cup;Self Fed;Spoon;Straw Pharyngeal  Phase Impairments: Cough - Delayed;Suspected delayed Swallow (given serial straw sips)    Nectar Thick Nectar Thick Liquid: Not tested   Honey Thick Honey Thick Liquid: Not tested   Puree Puree: Impaired Presentation: Spoon;Self Fed Oral Phase Impairments: Impaired mastication Oral Phase Functional Implications: Prolonged oral transit Pharyngeal Phase Impairments: Suspected delayed Swallow   Solid   GO   Solid: Impaired Presentation: Self Fed Oral Phase Impairments: Impaired mastication Oral Phase Functional Implications: Prolonged oral transit Pharyngeal Phase Impairments: Suspected delayed Swallow        Shelly Flatten, MA, CCC-SLP Acute Rehab SLP [phone] Lamar Sprinkles 01/13/2017,10:07 AM

## 2017-01-13 NOTE — Progress Notes (Signed)
STROKE TEAM PROGRESS NOTE   HISTORY OF PRESENT ILLNESS (per record) Nicole [name] is an 83 y.o. female with a prior history of ICH, advanced vascular dementia, CKD, HTN and hypothyroidism, on Eliquis for chronic atrial fibrillation and PE, who presented to the ED for evaluation of generalized weakness, respiratory distress and lethargy. She was hypertensive in the ED.   MRI revealed an acute small RIGHT basal ganglia hemorrhage without mass effect. Subacute LEFT frontal lobe 14 x 10 mm hematoma is also noted. Multifocal hypointense lesions on SWAN images are most consistent with microhemorrhages secondary to amyloid angiopathy. 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 Moderate to severe chronic small vessel ischemic disease.  SUBJECTIVE (INTERVAL HISTORY) No family is at the bedside.  She is awake alert, not orientated, but denies HA and no focal neuro deficit at this time. BP was high and fluctuating. Increased PO meds and PRN meds. May need to consider step down transfer for IV BP meds titration.    OBJECTIVE Temp:  [98 F (36.7 C)-98.9 F (37.2 C)] 98 F (36.7 C) (06/24 0432) Pulse Rate:  [80-105] 86 (06/24 0959) Cardiac Rhythm: Atrial fibrillation (06/24 0800) Resp:  [19-24] 24 (06/24 1121) BP: (151-230)/(78-161) 163/109 (06/24 1121) SpO2:  [99 %-100 %] 100 % (06/24 0432) Weight:  [147 lb 3.2 oz (66.8 kg)] 147 lb 3.2 oz (66.8 kg) (06/24 0432)  CBC:   Recent Labs Lab 01/11/17 1405 01/12/17 0700  WBC 5.8 7.2  NEUTROABS  --  4.7  HGB 11.6* 13.0  HCT 33.9* 39.7  MCV 68.9* 70.3*  PLT 234 540    Basic Metabolic Panel:   Recent Labs Lab 01/11/17 1405 01/12/17 0700  NA 139 138  K 3.4* 3.4*  CL 104 103  CO2 26 23  GLUCOSE 127* 136*  BUN 12 12  CREATININE 1.07* 1.07*  CALCIUM 9.0 8.7*  MG  --  1.6*    Lipid Panel:     Component Value Date/Time   CHOL 174 11/07/2016 0856   CHOL 142 10/12/2015 0928   TRIG 89 11/07/2016 0856   HDL 47 (L) 11/07/2016 0856    HDL 47 10/12/2015 0928   CHOLHDL 3.7 11/07/2016 0856   VLDL 18 11/07/2016 0856   LDLCALC 109 (H) 11/07/2016 0856   LDLCALC 79 10/12/2015 0928   HgbA1c:  Lab Results  Component Value Date   HGBA1C 6.9 (H) 11/07/2016   Urine Drug Screen:     Component Value Date/Time   LABOPIA NONE DETECTED 07/20/2016 2021   COCAINSCRNUR NONE DETECTED 07/20/2016 2021   LABBENZ NONE DETECTED 07/20/2016 2021   AMPHETMU NONE DETECTED 07/20/2016 2021   THCU NONE DETECTED 07/20/2016 2021   LABBARB NONE DETECTED 07/20/2016 2021    Alcohol Level No results found for: [place] I have personally reviewed the radiological images below and agree with the radiology interpretations.  Ct Head Wo Contrast 01/12/2017   Acute 12 x 13 x 14 mm RIGHT basal ganglia hemorrhage without significant mass effect. 12 x 13 mm faintly dense LEFT frontal lobe lesion could reflect subacute hemorrhage or, metastasis. MRI of the brain with contrast is recommended. Moderate to severe chronic small vessel ischemic disease and old lacunar infarcts.   Ct Angio Chest Pe W/cm &/or Wo Cm 01/11/2017 Filling defect is again noted in lower lobe branch of right pulmonary artery ; it is uncertain if this represents chronic or recurrent acute pulmonary embolus. Stable enlargement of pulmonary artery suggesting pulmonary artery hypertension. Moderate right pleural effusion is  noted with adjacent subsegmental atelectasis. 4.8 cm ascending thoracic aortic aneurysm is noted. Ascending thoracic aortic aneurysm. Recommend semi-annual imaging followup by CTA or MRA and referral to cardiothoracic surgery if not already obtained.   Mr Nicole [name] Wo Contrast 01/12/2017 Moderately motion degraded examination. Acute small RIGHT basal ganglia hemorrhage without mass effect. Subacute LEFT frontal lobe 14 x 10 mm hematoma. Extensive susceptibility artifact compatible with chronic hypertension though, there may be compartment amyloid angiopathy. Old hemorrhagic  RIGHT basal ganglia infarct. Moderate to severe chronic small vessel ischemic disease.   Dg Chest Portable 1 View 01/11/2017 1. Stable cardiomegaly without overt pulmonary edema .  2. Hazy curvilinear bibasilar lung opacities, favor scarring or atelectasis.   TTE 09/863 Normal LV systolic function; mild AI; mildly dilated ascending   aorta (4.3 cm); mild MR; severe biatrial enlargement; mild RVE;   mild TR with mildly elevated pulmonary pressure.    PHYSICAL EXAM  Temp:  [98 F (36.7 C)-98.9 F (37.2 C)] 98 F (36.7 C) (06/24 0432) Pulse Rate:  [80-105] 86 (06/24 0959) Resp:  [19-24] 24 (06/24 1121) BP: (151-230)/(78-161) 163/109 (06/24 1121) SpO2:  [99 %-100 %] 100 % (06/24 0432) Weight:  [147 lb 3.2 oz (66.8 kg)] 147 lb 3.2 oz (66.8 kg) (06/24 0432)  General - Well nourished, well developed, in no apparent distress.  Ophthalmologic - Fundi not visualized due to noncooperation.  Cardiovascular - irregularly irregular heart rate and rhythm, tele showed afib with RVR  Mental Status -  Awake alert, orientated to self, but not orientation to age, time, place, and person. Language including expression, repetition, comprehension was assessed and found intact, however, naming 3/4.  Cranial Nerves II - XII - II - Visual field intact OU. III, IV, VI - Extraocular movements intact. V - Facial sensation intact bilaterally. VII - Facial movement intact bilaterally. VIII - Hearing & vestibular intact bilaterally. X - Palate elevates symmetrically. XI - Chin turning & shoulder shrug intact bilaterally. XII - Tongue protrusion intact.  Motor Strength - The patient's strength was normal in all extremities and pronator drift was absent.  Bulk was normal and fasciculations were absent.   Motor Tone - Muscle tone was assessed at the neck and appendages and was normal.  Reflexes - The patient's reflexes were 1+ in all extremities and she had no pathological reflexes.  Sensory - Light  touch, temperature/pinprick were assessed and were symmetrical.    Coordination - The patient had normal movements in the hands with no ataxia or dysmetria.  Tremor was absent.  Gait and Station - not tested.   ASSESSMENT/PLAN [name] is a 83 y.o. female with history of dementia, afib and recent PE on eliquis, history of ICH, HTN, CKD, DM presenting with generalized weakness, lethargy, and elevated BP.   ICH:  bilateral ICH with right thalamus acute and left frontal subacute, likely due to high BP in the setting of eliquis for PE treatment.   Resultant  Generalized weakness, lethargy  CT head right thalamus acute and left frontal subacute ICH  MRI head Acute small RIGHT basal ganglia hemorrhage without mass effect. Subacute LEFT frontal lobe 14 x 10 mm hematoma. Extensive susceptibility artifact compatible with chronic hypertension   2D Echo  08/2016 EF 55-60%  LDL 109 in 10/2016  HgbA1c 6.9 in 10/2016  SCDs for VTE prophylaxis Diet regular Room service appropriate? Yes; Fluid consistency: Thin  Eliquis (apixaban) daily prior to admission, now on No antithrombotic  Ongoing aggressive stroke risk factor  management  Therapy recommendations:  pending  Disposition:  Pending  Hypertensive emergency  Unstable, still on the high side  Due to recurrent ICH, BP goal < 140  On clonidine, HCTZ, hydralazine, metoprolol and lisinopril  Hydralazine and labetalol injection PRN  Discussed with Dr. Wendee Beavers, recommend stepdown transfer for cleviprex IV titration.   PE  Diagnosed in 07/2016 and put on eliquis 5mg  bid  Repeat CTA chest this admission right LL PE, not sure chronic or recurrent PE this time  However, off eliquis now due to acute ICH  Close monitoring  Respiratory condition stable at this time  Hx of ICH on coumadin  03/2015 right caudate ICH with IVH, INR 3.06 - EF 55-60% LDL 97 and A1C 5.9  Pt was taken off coumadin and continued on ASA once hematoma  resolved  Follow[facility] Leonie Man at [facility]  Chronic afib  Was on coumadin but switched to ASA due to [place] in 03/2015  However, started eliquis 07/2016 due to PE  Currently still has afib with intermittent RVR  On metoprolol for rate control  Hold off antiplatelet and anticoagulation at this time due to [place]  Hyperlipidemia  Home meds: none  LDL 109 in 10/2016, goal < 70  Will not initiate statin therapy at this time  Diabetes  HgbA1c 6.9 in 10/2016, goal < 7.0  Uncontrolled  Hyperglycemia  Manage per primary team  Other Stroke Risk Factors  Advanced age  Other Active Problems  Dementia   4.8 cm ascending thoracic aortic aneurysm -> referral to cardiothoracic surgery if not already obtained.  CKD stage II  Hypokalemia - K 3.4  Followed with Dr Leonie Man at [facility] day # 1  This patient is medically critical due to hypertensive emergency with ICH. Patient is at high risk for recurrent bleeding and brain herniation and neurological worsening. This patient's care requires constant monitoring of vital signs, hemodynamics, respiratory and cardiac monitoring, review of multiple databases, neurological assessment, discussion with family, other specialists and medical decision making of high complexity. I have discussed the case with Dr. Wendee Beavers and will transfer the pt to high level of care.  Rosalin Hawking, MD PhD Stroke Neurology 01/13/2017 11:45 AM    To contact Stroke Continuity provider, please refer to [email or web address] After hours, contact General Neurology

## 2017-01-13 NOTE — Progress Notes (Signed)
PROGRESS NOTE    Nicole [name]  NOM:[id number] DOB: 1930-10-04 DOA: 01/11/2017 PCP: [name], DO    Brief Narrative:  83 y.o. female with complicated past medical history significant for history of intracranial hemorrhage, vascular dementia, chronic atrial fibrillation and PE on Eliquis, history of GI bleed, hypertension, chronic kidney disease stage II-III, and hypothyroidism, now presented to the emergency department at the direction of her PCP for evaluation of generalized weakness, lethargy, and respiratory distress.   Assessment & Plan:   Principal Problem: Basal ganglia hemorrhage - Patient has multiple areas of hemorrhage. I have consulted neurology and they will assist with further management. Patient should never be anticoagulated given that she has a propensity for bleeding into her brain. - Tight blood pressure control was recommended by neurology. Will continue oral regimen and have PRN hydralazine for goal SBP of > 140. - Transfer to step down for closer monitoring and adjustment of BP medications. I discussed case with nursing who stated that they had not administered oral antihypertensives until patient was cleared by speech therapy. When I rounded on patient she had just received her oral antihypertensives. I indicated that nursing rechecking an hour and if still elevated above goal then nursing should administer when necessary hydralazine.    Generalized weakness - The patient has many serious active medical problems which make her prognosis poor. I discussed with family. Patient has several intracerebral hemorrhages. Please refer to MRI for details.   - Neurosurgery consulted and recommended holding eliquis. The problem there is that patient has PE and atrial fibrillation which puts her at risk for mortality and comorbidity as a result. This was also discussed with family and I recommended obtaining a palliative care consult for goals of care.    Active  Problems:   Anemia - Resolved on repeat blood tests   hypokalemia - Replace orally and reassess (BMP ordered)    Thoracic aortic aneurysm, 4.8cm 2011   HTN (hypertension) - Continue current antihypertensive regimen: clonidine, imdur, lisinopril, metoprolol - Goal is for systolic blood pressure 962 or less - Transfer to stepdown for closer monitoring    Acute respiratory distress - resolved on supplemental oxygen    ICH (intracerebral hemorrhage) ([place]) - Was discussed with neurosurgeon and neurology - Eliquis discontinued    Chronic atrial fibrillation ([place]) - Not recommended to use anticoagulation given above - Rate controlled on metoprolol    Hypertensive urgency - Continue home blood pressure medications and will add when necessary hydralazine    Type 2 diabetes mellitus with peripheral neuropathy (HCC)   Vascular dementia   Hypothyroidism due to acquired atrophy of thyroid - continue synthroid   Pulmonary embolus (HCC) - no longer anticoagulant candidate - palliative care for goals of care   DVT prophylaxis: The patient is not a anticoagulation candidate and has history of PE do not feel comfortable placing SCDs until we can rule out DVTs  Code Status: DO NOT RESUSCITATE  Family Communication: discussed with sister and daughter  Disposition Plan: palliative care consult, transfer to stepdown unit   Consultants:   Neurology   Procedures: None   Antimicrobials:None   Subjective: Pt has no new complaints no acute issues overnight.  Objective: Vitals:   01/13/17 0044 01/13/17 0432 01/13/17 0959 01/13/17 1121  BP: (!) 151/78 (!) 157/86 (!) 200/98 (!) 163/109  Pulse: 80 81 86   Resp: 19 19  (!) 24  Temp: 98.1 F (36.7 C) 98 F (36.7 C)    TempSrc: Oral  SpO2: 100% 100%    Weight:  66.8 kg (147 lb 3.2 oz)    Height:        Intake/Output Summary (Last 24 hours) at 01/13/17 1211 Last data filed at 01/13/17 0853  Gross per 24 hour  Intake                 0 ml  Output                0 ml  Net                0 ml   Filed Weights   01/11/17 2108 01/12/17 0347 01/13/17 0432  Weight: 66.5 kg (146 lb 8 oz) 66.8 kg (147 lb 3.2 oz) 66.8 kg (147 lb 3.2 oz)    Examination:  General exam:  in nad, Appears calm and comfortable Respiratory system: Equal chest rise, no wheezes, no increased work of breathing Cardiovascular system: irregular rate and rhythm, no murmurs rubs  Telemetry: CV strip showing A. fib  Gastrointestinal system: Abdomen is nondistended, soft and nontender. No organomegaly or masses felt. Normal bowel sounds heard. Central nervous system: Alert and Awake, no facial asymmetry Extremities: warm Skin: No rashes, lesions or ulcers, on limited exam. Psychiatry:  Mood & affect appropriate.     Data Reviewed: I have personally reviewed following labs and imaging studies  CBC:  Recent Labs Lab 01/11/17 1405 01/12/17 0700  WBC 5.8 7.2  NEUTROABS  --  4.7  HGB 11.6* 13.0  HCT 33.9* 39.7  MCV 68.9* 70.3*  PLT 234 970   Basic Metabolic Panel:  Recent Labs Lab 01/11/17 1405 01/12/17 0700  NA 139 138  K 3.4* 3.4*  CL 104 103  CO2 26 23  GLUCOSE 127* 136*  BUN 12 12  CREATININE 1.07* 1.07*  CALCIUM 9.0 8.7*  MG  --  1.6*   GFR: Estimated Creatinine Clearance: 40.2 mL/min (A) (by C-G formula based on SCr of 1.07 mg/dL (H)). Liver Function Tests: No results for input(s): AST, ALT, ALKPHOS, BILITOT, PROT, ALBUMIN in the last 168 hours. No results for input(s): LIPASE, AMYLASE in the last 168 hours.  Recent Labs Lab 01/11/17 2123  AMMONIA 24   Coagulation Profile: No results for input(s): INR, PROTIME in the last 168 hours. Cardiac Enzymes:  Recent Labs Lab 01/11/17 2123 01/12/17 0055 01/12/17 0700  TROPONINI <0.03 <0.03 <0.03   BNP (last 3 results) No results for input(s): PROBNP in the last 8760 hours. HbA1C: No results for input(s): HGBA1C in the last 72 hours. CBG:  Recent Labs Lab  01/12/17 1147 01/12/17 1613 01/12/17 2200 01/13/17 0805 01/13/17 1115  GLUCAP 171* 191* 287* 116* 172*   Lipid Profile: No results for input(s): CHOL, HDL, LDLCALC, TRIG, CHOLHDL, LDLDIRECT in the last 72 hours. Thyroid Function Tests:  Recent Labs  01/11/17 2123  TSH 0.244*   Anemia Panel:  Recent Labs  01/11/17 2123  VITAMINB12 265  FERRITIN 21  TIBC 305  IRON 32   Sepsis Labs:  Recent Labs Lab 01/11/17 1505 01/11/17 1932  LATICACIDVEN 1.49 1.27    No results found for this or any previous visit (from the past 240 hour(s)).    Radiology Studies: Ct Head Wo Contrast  Addendum Date: 01/12/2017   ADDENDUM REPORT: 01/12/2017 00:00 ADDENDUM: Critical Value/emergent results were called by telephone at the time of interpretation on 01/12/2017 at 12:00 am to NP Walden Field , who verbally acknowledged these results. Electronically Signed   By: Sandie Ano  Bloomer M.D.   On: 01/12/2017 00:00   Result Date: 01/12/2017 CLINICAL DATA:  Weakness. History of colon cancer, intracranial hemorrhage, atrial fibrillation, diabetes, hypertension. EXAM: CT HEAD WITHOUT CONTRAST TECHNIQUE: Contiguous axial images were obtained from the base of the skull through the vertex without intravenous contrast. COMPARISON:  CT HEAD July 20, 2016 FINDINGS: BRAIN: 12 x 13 x 14 mm (volume = 1100 mm^3) acute RIGHT basal ganglia hemorrhage, minimal surrounding vasogenic edema. No significant mass effect. Faintly dense 12 x 13 mm LEFT frontal cortical to subcortical lesion, new from prior CT. Ventricles and sulci are normal for patient's age. Confluent supratentorial white matter hypodensities. Old basal ganglia and thalamus lacunar infarcts. No acute large vascular territory infarct. No abnormal extra-axial fluid collections. VASCULAR: Moderate calcific atherosclerosis of the carotid siphons. SKULL: No skull fracture. No significant scalp soft tissue swelling. SINUSES/ORBITS: Chronic sphenoid sinusitis,  hypoplastic RIGHT sphenoid. Mastoid air cells are well aerated.The included ocular globes and orbital contents are non-suspicious. OTHER: None. IMPRESSION: Acute 12 x 13 x 14 mm RIGHT basal ganglia hemorrhage without significant mass effect. 12 x 13 mm faintly dense LEFT frontal lobe lesion could reflect subacute hemorrhage or, metastasis. MRI of the brain with contrast is recommended. Moderate to severe chronic small vessel ischemic disease and old lacunar infarcts. Electronically Signed: By: Elon Alas M.D. On: 01/11/2017 23:53   Ct Angio Chest Pe W/cm &/or Wo Cm  Result Date: 01/11/2017 CLINICAL DATA:  Chest pain, shortness of breath. EXAM: CT ANGIOGRAPHY CHEST WITH CONTRAST TECHNIQUE: Multidetector CT imaging of the chest was performed using the standard protocol during bolus administration of intravenous contrast. Multiplanar CT image reconstructions and MIPs were obtained to evaluate the vascular anatomy. CONTRAST:  100 mL of Isovue 370 intravenously. COMPARISON:  CT scan of August 23, 2016. FINDINGS: Cardiovascular: 4.8 cm ascending thoracic aortic aneurysm is noted. Atherosclerosis of thoracic aorta is noted. Filling defect is again noted in lower lobe branch of right pulmonary artery, and it is uncertain if this represents chronic or recurrent acute thrombus. Mild cardiomegaly is noted. No pericardial effusion is noted. Stable enlargement of pulmonary artery is noted suggesting pulmonary artery hypertension. Mediastinum/Nodes: No enlarged mediastinal, hilar, or axillary lymph nodes. Thyroid gland, trachea, and esophagus demonstrate no significant findings. Lungs/Pleura: No pneumothorax is noted. Moderate right pleural effusion is noted with adjacent subsegmental atelectasis. Left lung is clear. Upper Abdomen: Left lung is clear. Musculoskeletal: No chest wall abnormality. No acute or significant osseous findings. Review of the MIP images confirms the above findings. IMPRESSION: Filling defect is  again noted in lower lobe branch of right pulmonary artery ; it is uncertain if this represents chronic or recurrent acute pulmonary embolus. Critical Value/emergent results were called by telephone at the time of interpretation on 01/11/2017 at 5:13 pm to Dr. Clayton Bibles , who verbally acknowledged these results. Stable enlargement of pulmonary artery suggesting pulmonary artery hypertension. Moderate right pleural effusion is noted with adjacent subsegmental atelectasis. 4.8 cm ascending thoracic aortic aneurysm is noted. Ascending thoracic aortic aneurysm. Recommend semi-annual imaging followup by CTA or MRA and referral to cardiothoracic surgery if not already obtained. This recommendation follows 2010 ACCF/AHA/AATS/ACR/ASA/SCA/SCAI/SIR/STS/SVM Guidelines for the Diagnosis and Management of Patients With Thoracic Aortic Disease. Circulation. 2010; 121: G387-F643. Aortic Atherosclerosis (ICD10-I70.0). Electronically Signed   By: Marijo Conception, M.D.   On: 01/11/2017 17:13   Mr Jeri Cos PI Contrast  Result Date: 01/12/2017 CLINICAL DATA:  Followup hemorrhage. History of colon cancer, hypertension, atrial fibrillation, dementia. EXAM: MRI  HEAD WITHOUT AND WITH CONTRAST TECHNIQUE: Multiplanar, multiecho pulse sequences of the brain and surrounding structures were obtained without and with intravenous contrast. CONTRAST:  11mL MULTIHANCE GADOBENATE DIMEGLUMINE 529 MG/ML IV SOLN COMPARISON:  None. FINDINGS: Moderately motion degraded examination, fast sequences utilized. INTRACRANIAL CONTENTS: Susceptibility artifact RIGHT basal ganglia corresponding to known hemorrhage with favoring of T2 bright vasogenic edema. Numerous central and peripheral foci of susceptibility artifact. Confluent susceptibility artifact RIGHT basal ganglia, LEFT thalamus and to lesser extent LEFT basal ganglia. 14 x 10 mm LEFT frontal lobe lesion with intrinsic T1 shortening, minimal enhancement. Ventricles and sulci are overall normal for  patient's age, mild ex vacuo dilatation RIGHT lateral ventricle associated with RIGHT basal ganglia infarct. Patchy to confluent supratentorial white matter T2 hyperintensities. Old tiny LEFT cerebellar infarct. No abnormal extra-axial fluid collections or abnormal extra-axial enhancement. No midline shift, mass effect or suspicious enhancement . VASCULAR: Normal major intracranial vascular flow voids present at skull base. SKULL AND UPPER CERVICAL SPINE: No abnormal sellar expansion. No suspicious calvarial bone marrow signal. Craniocervical junction maintained. SINUSES/ORBITS: The mastoid air-cells and included paranasal sinuses are well-aerated.The included ocular globes and orbital contents are non-suspicious. Status post bilateral ocular lens implants. OTHER: None. IMPRESSION: Moderately motion degraded examination. Acute small RIGHT basal ganglia hemorrhage without mass effect. Subacute LEFT frontal lobe 14 x 10 mm hematoma. Extensive susceptibility artifact compatible with chronic hypertension though, there may be compartment amyloid angiopathy. 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 Moderate to severe chronic small vessel ischemic disease. Electronically Signed   By: Elon Alas M.D.   On: 01/12/2017 06:22   Dg Chest Portable 1 View  Result Date: 01/11/2017 CLINICAL DATA:  Chest pain and dyspnea EXAM: PORTABLE CHEST 1 VIEW COMPARISON:  08/22/2016 chest radiograph. FINDINGS: Stable cardiomediastinal silhouette with mild to moderate cardiomegaly and aortic atherosclerosis. No pneumothorax. No pleural effusion. No overt pulmonary edema. Mild curvilinear bibasilar opacities. IMPRESSION: 1. Stable cardiomegaly without overt pulmonary edema . 2. Hazy curvilinear bibasilar lung opacities, favor scarring or atelectasis. Electronically Signed   By: Ilona Sorrel M.D.   On: 01/11/2017 14:42      Scheduled Meds: . allopurinol  100 mg Oral Daily  . cloNIDine  0.2 mg Oral TID  . donepezil  10 mg  Oral QHS  . hydrALAZINE  50 mg Oral Q8H  . isosorbide mononitrate  30 mg Oral Daily  . levothyroxine  50 mcg Oral QAC breakfast  . lisinopril  20 mg Oral Daily  . metoprolol tartrate  50 mg Oral BID  . sodium chloride flush  3 mL Intravenous Q12H   Continuous Infusions:   LOS: 1 day    Time spent: > 30 minutes  Velvet Bathe, MD [facility] Pager [phone]  If 7PM-7AM, please contact night-coverage [email or web address] Password [facility] 01/13/2017, 12:11 PM

## 2017-01-14 ENCOUNTER — Ambulatory Visit (HOSPITAL_COMMUNITY): Payer: Medicare Other

## 2017-01-14 DIAGNOSIS — Z515 Encounter for palliative care: Secondary | ICD-10-CM

## 2017-01-14 DIAGNOSIS — Z86711 Personal history of pulmonary embolism: Secondary | ICD-10-CM

## 2017-01-14 DIAGNOSIS — Z66 Do not resuscitate: Secondary | ICD-10-CM

## 2017-01-14 LAB — GLUCOSE, CAPILLARY
GLUCOSE-CAPILLARY: 142 mg/dL — AB (ref 65–99)
GLUCOSE-CAPILLARY: 183 mg/dL — AB (ref 65–99)
Glucose-Capillary: 213 mg/dL — ABNORMAL HIGH (ref 65–99)
Glucose-Capillary: 167 mg/dL — ABNORMAL HIGH (ref 65–99)

## 2017-01-14 LAB — BASIC METABOLIC PANEL
ANION GAP: 7 (ref 5–15)
BUN: 9 mg/dL (ref 6–20)
CALCIUM: 8.8 mg/dL — AB (ref 8.9–10.3)
CO2: 28 mmol/L (ref 22–32)
Chloride: 107 mmol/L (ref 101–111)
Creatinine, Ser: 1.23 mg/dL — ABNORMAL HIGH (ref 0.44–1.00)
GFR calc Af Amer: 46 mL/min — ABNORMAL LOW (ref >60)
GFR, EST NON AFRICAN AMERICAN: 39 mL/min — AB (ref >60)
Glucose, Bld: 114 mg/dL — ABNORMAL HIGH (ref 65–99)
POTASSIUM: 3.4 mmol/L — AB (ref 3.5–5.1)
SODIUM: 142 mmol/L (ref 135–145)

## 2017-01-14 LAB — FOLATE RBC
FOLATE, RBC: 977 ng/mL (ref >498)
Folate, Hemolysate: 334.1 ng/mL
Hematocrit: 34.2 % (ref 34.0–46.6)

## 2017-01-14 MED ORDER — CLONIDINE HCL 0.3 MG PO TABS: 0.3000 mg | ORAL_TABLET | Freq: Three times a day (TID) | ORAL | Status: DC

## 2017-01-14 MED ORDER — POTASSIUM CHLORIDE CRYS ER 20 MEQ PO TBCR
20.0000 meq | EXTENDED_RELEASE_TABLET | Freq: Once | ORAL | Status: AC
  Administered 2017-01-14: 20 meq via ORAL
  Filled 2017-01-14: qty 1

## 2017-01-14 MED ORDER — METOPROLOL TARTRATE 50 MG PO TABS
75.0000 mg | ORAL_TABLET | Freq: Two times a day (BID) | ORAL | Status: DC
  Administered 2017-01-14 – 2017-01-17 (×7): 75 mg via ORAL
  Filled 2017-01-14 (×7): qty 1

## 2017-01-14 MED ORDER — CLONIDINE HCL 0.3 MG PO TABS
0.3000 mg | ORAL_TABLET | Freq: Three times a day (TID) | ORAL | Status: DC
  Administered 2017-01-14 – 2017-01-15 (×4): 0.3 mg via ORAL
  Filled 2017-01-14 (×4): qty 1

## 2017-01-14 NOTE — Progress Notes (Signed)
*  PRELIMINARY RESULTS* Vascular Ultrasound Lower extremity venous duplex has been completed.  Preliminary findings: No evidence of DVT or baker's cyst.  Landry Mellow, RDMS, RVT  01/14/2017, 2:56 PM

## 2017-01-14 NOTE — Progress Notes (Signed)
Physical Therapy Treatment Patient Details Name: Nicole [name] MRN: [id number] DOB: Dec 21, 1930 Today's Date: 01/14/2017    History of Present Illness Nicole [name] is a 83 y.o. female with complicated past medical history significant for history of intracranial hemorrhage, Lt-sided weakness, vascular dementia, chronic atrial fibrillation and PE on Eliquis, history of GI bleed, hypertension, chronic kidney disease stage II-III, and hypothyroidism.  Patient is admitted 01/11/17 for evaluation of generalized weakness, lethargy, and respiratory distress.   Patient also with HTN.  MRI showed multiple areas of hemorrhage.    PT Comments    Pt performed increased activity and mobility during session.  Pt reports she feels better after walking.  Pt remains to require assistance with transfers and steering RW.  HR 92 bpm Afib on monitor.     Follow Up Recommendations  Home health PT;Supervision/Assistance - 24 hour     Equipment Recommendations  None recommended by PT    Recommendations for Other Services       Precautions / Restrictions Precautions Precautions: Fall Precaution Comments: Lt-sided weakness Restrictions Weight Bearing Restrictions: No    Mobility  Bed Mobility Overal bed mobility: Needs Assistance Bed Mobility: Supine to Sit     Supine to sit: Supervision     General bed mobility comments: Increased time to complete.  Cues to progress to edge of bed.    Transfers Overall transfer level: Needs assistance Equipment used: Rolling walker (2 wheeled) Transfers: Sit to/from Stand Sit to Stand: Min assist         General transfer comment: Cues for hand placement to and from seated surface  Ambulation/Gait Ambulation/Gait assistance: Min assist Ambulation Distance (Feet): 90 Feet Assistive device: Rolling walker (2 wheeled) Gait Pattern/deviations: Decreased stride length;Shuffle;Step-through pattern;Trunk flexed Gait velocity: decreased Gait velocity  interpretation: Below normal speed for age/gender General Gait Details: Pt required assistance with steering RW and occasionally lags behind on L side due to residual weakness.  Pt fatigues quickly.  DOE 2/4.     Stairs            Wheelchair Mobility    Modified Rankin (Stroke Patients Only)       Balance Overall balance assessment: Needs assistance Sitting-balance support: No upper extremity supported;Feet supported Sitting balance-Leahy Scale: Good       Standing balance-Leahy Scale: Fair                              Cognition Arousal/Alertness: Awake/alert Behavior During Therapy: WFL for tasks assessed/performed Overall Cognitive Status: History of cognitive impairments - at baseline                                        Exercises      General Comments        Pertinent Vitals/Pain Pain Assessment: No/denies pain    Home Living                      Prior Function            PT Goals (current goals can now be found in the care plan section) Acute Rehab PT Goals Patient Stated Goal: To get to go home Potential to Achieve Goals: Good Progress towards PT goals: Progressing toward goals    Frequency    Min 3X/week      PT Plan Current  plan remains appropriate    Co-evaluation              AM-PAC PT "6 Clicks" Daily Activity  Outcome Measure  Difficulty turning over in bed (including adjusting bedclothes, sheets and blankets)?: None Difficulty moving from lying on back to sitting on the side of the bed? : A Little Difficulty sitting down on and standing up from a chair with arms (e.g., wheelchair, bedside commode, etc,.)?: A Little Help needed moving to and from a bed to chair (including a wheelchair)?: A Little Help needed walking in hospital room?: A Little Help needed climbing 3-5 steps with a railing? : A Little 6 Click Score: 19    End of Session Equipment Utilized During Treatment: Gait  belt;Oxygen Activity Tolerance: Patient tolerated treatment well Patient left: in bed;with call bell/phone within reach;with bed alarm set;with family/visitor present Nurse Communication: Mobility status PT Visit Diagnosis: Unsteadiness on feet (R26.81);Other abnormalities of gait and mobility (R26.89);Muscle weakness (generalized) (M62.81);Pain Pain - Right/Left: Left Pain - part of body:  (L side of neck)     Time: 2993-7169 PT Time Calculation (min) (ACUTE ONLY): 26 min  Charges:  $Gait Training: 8-22 mins $Therapeutic Activity: 8-22 mins                    G Codes:       Governor Rooks, PTA pager [phone]    Cristela Blue 01/14/2017, 11:21 AM

## 2017-01-14 NOTE — Progress Notes (Signed)
PROGRESS NOTE    Nicole [name]  HUT:[id number] DOB: April 30, 1931 DOA: 01/11/2017 PCP: [name], DO    Brief Narrative:  83 y.o. female with complicated past medical history significant for history of intracranial hemorrhage, vascular dementia, chronic atrial fibrillation and PE on Eliquis, history of GI bleed, hypertension, chronic kidney disease stage II-III, and hypothyroidism, now presented to the emergency department at the direction of her PCP for evaluation of generalized weakness, lethargy, and respiratory distress.   Assessment & Plan:   Principal Problem: Basal ganglia hemorrhage - Patient has multiple areas of hemorrhage. I have consulted neurology and they will assist with further management. Patient should never be anticoagulated given that she has a propensity for bleeding into her brain. - Tight blood pressure control was recommended by neurology. Will continue oral regimen and have PRN hydralazine for goal SBP of > 140. - I have increased patients B blocker dose    Generalized weakness - The patient has many serious active medical problems which make her prognosis poor. I discussed with family. Patient has several intracerebral hemorrhages. Please refer to MRI for details.   - Neurosurgery consulted and recommended holding eliquis. The problem there is that patient has PE and atrial fibrillation which puts her at risk for mortality and comorbidity as a result. This was also discussed with family and I recommended obtaining a palliative care consult for goals of care. Currently consideration is whether the patient should have IVC filter or not. Discussed with sister and daughter would like to talk to the family.  Active Problems:   Anemia - Resolved on repeat blood tests    hypokalemia - Replace orally and reassess (BMP ordered)    Thoracic aortic aneurysm, 4.8cm 2011   HTN (hypertension) - Continue current antihypertensive regimen: clonidine, imdur, lisinopril,  metoprolol - Goal is for systolic blood pressure 656 or less - Transfer to stepdown for closer monitoring    Acute respiratory distress - resolved on supplemental oxygen    ICH (intracerebral hemorrhage) ([place]) - Was discussed with neurosurgeon and neurology - Eliquis discontinued    Chronic atrial fibrillation ([place]) - Not recommended to use anticoagulation given above - Continue rate control metoprolol    Hypertensive urgency - Continue home blood pressure medications and will add when necessary hydralazine    Type 2 diabetes mellitus with peripheral neuropathy (HCC)   Vascular dementia    Hypothyroidism due to acquired atrophy of thyroid - Stable on Synthroid    Pulmonary embolus (HCC) - no longer anticoagulant candidate - Palliative care consulted for goals of care - Consideration is for IVC filter   DVT prophylaxis: Place SCDs Code Status: DO NOT RESUSCITATE  Family Communication: discussed with sister and daughter again today Disposition Plan: palliative care consult   Consultants:   Neurology  Palliative care   Procedures: None   Antimicrobials:None   Subjective: Patient has undergone complaints reported to me today  Objective: Vitals:   01/14/17 0405 01/14/17 0825 01/14/17 0829 01/14/17 1000  BP: (!) 164/98 (!) 174/91  124/89  Pulse: 77  94   Resp: 20     Temp: 98.8 F (37.1 C)     TempSrc:      SpO2: 100%     Weight: 66.3 kg (146 lb 3.2 oz)     Height:        Intake/Output Summary (Last 24 hours) at 01/14/17 1658 Last data filed at 01/14/17 1300  Gross per 24 hour  Intake  360 ml  Output              150 ml  Net              210 ml   Filed Weights   01/12/17 0347 01/13/17 0432 01/14/17 0405  Weight: 66.8 kg (147 lb 3.2 oz) 66.8 kg (147 lb 3.2 oz) 66.3 kg (146 lb 3.2 oz)    Examination:  General exam:  Patient is alert and oriented, no acute distress Respiratory system: No increased work of breathing, no wheezes, equal  chest rise Cardiovascular system: irregular rate and rhythm, no murmurs rubs  Telemetry: CV strip showing A. fib  Gastrointestinal system: Abdomen is nondistended, soft and nontender. No organomegaly or masses felt. Normal bowel sounds heard. Central nervous system:  no facial asymmetry, Alert and Awake, Extremities: warm, dry Skin: No rashes, lesions or ulcers, on limited exam. Psychiatry:  Mood & affect appropriate.     Data Reviewed: I have personally reviewed following labs and imaging studies  CBC:  Recent Labs Lab 01/11/17 1405 01/11/17 2123 01/12/17 0700  WBC 5.8  --  7.2  NEUTROABS  --   --  4.7  HGB 11.6*  --  13.0  HCT 33.9* 34.2 39.7  MCV 68.9*  --  70.3*  PLT 234  --  950   Basic Metabolic Panel:  Recent Labs Lab 01/11/17 1405 01/12/17 0700 01/13/17 1236 01/14/17 0420  NA 139 138 140 142  K 3.4* 3.4* 3.4* 3.4*  CL 104 103 107 107  CO2 26 23 27 28   GLUCOSE 127* 136* 212* 114*  BUN 12 12 12 9   CREATININE 1.07* 1.07* 1.24* 1.23*  CALCIUM 9.0 8.7* 8.8* 8.8*  MG  --  1.6*  --   --    GFR: Estimated Creatinine Clearance: 35 mL/min (A) (by C-G formula based on SCr of 1.23 mg/dL (H)). Liver Function Tests: No results for input(s): AST, ALT, ALKPHOS, BILITOT, PROT, ALBUMIN in the last 168 hours. No results for input(s): LIPASE, AMYLASE in the last 168 hours.  Recent Labs Lab 01/11/17 2123  AMMONIA 24   Coagulation Profile: No results for input(s): INR, PROTIME in the last 168 hours. Cardiac Enzymes:  Recent Labs Lab 01/11/17 2123 01/12/17 0055 01/12/17 0700  TROPONINI <0.03 <0.03 <0.03   BNP (last 3 results) No results for input(s): PROBNP in the last 8760 hours. HbA1C: No results for input(s): HGBA1C in the last 72 hours. CBG:  Recent Labs Lab 01/13/17 1706 01/13/17 2150 01/14/17 0820 01/14/17 1127 01/14/17 1610  GLUCAP 157* 133* 142* 213* 183*   Lipid Profile: No results for input(s): CHOL, HDL, LDLCALC, TRIG, CHOLHDL, LDLDIRECT  in the last 72 hours. Thyroid Function Tests:  Recent Labs  01/11/17 2123  TSH 0.244*   Anemia Panel:  Recent Labs  01/11/17 2123  VITAMINB12 265  FERRITIN 21  TIBC 305  IRON 32   Sepsis Labs:  Recent Labs Lab 01/11/17 1505 01/11/17 1932  LATICACIDVEN 1.49 1.27    Recent Results (from the past 240 hour(s))  Urine culture     Status: Abnormal   Collection Time: 01/11/17  5:26 PM  Result Value Ref Range Status   Specimen Description URINE, RANDOM  Final   Special Requests NONE  Final   Culture (A)  Final    >=100,000 COLONIES/mL LACTOBACILLUS SPECIES Standardized susceptibility testing for this organism is not available.    Report Status 01/13/2017 FINAL  Final      Radiology Studies: No  results found.    Scheduled Meds: . allopurinol  100 mg Oral Daily  . cloNIDine  0.3 mg Oral TID  . donepezil  10 mg Oral QHS  . hydrALAZINE  50 mg Oral Q8H  . isosorbide mononitrate  30 mg Oral Daily  . levothyroxine  50 mcg Oral QAC breakfast  . lisinopril  20 mg Oral Daily  . metoprolol tartrate  75 mg Oral BID  . sodium chloride flush  3 mL Intravenous Q12H   Continuous Infusions:   LOS: 2 days    Time spent: > 35 minutes  Velvet Bathe, MD [facility] Pager [phone]  If 7PM-7AM, please contact night-coverage [email or web address] Password [facility] 01/14/2017, 4:58 PM

## 2017-01-14 NOTE — Progress Notes (Signed)
STROKE TEAM PROGRESS NOTE   HISTORY OF PRESENT ILLNESS (per record) Nicole [name] is an 83 y.o. female with a prior history of ICH, advanced vascular dementia, CKD, HTN and hypothyroidism, on Eliquis for chronic atrial fibrillation and PE, who presented to the ED for evaluation of generalized weakness, respiratory distress and lethargy. She was hypertensive in the ED.   MRI revealed an acute small RIGHT basal ganglia hemorrhage without mass effect. Subacute LEFT frontal lobe 14 x 10 mm hematoma is also noted. Multifocal hypointense lesions on SWAN images are most consistent with microhemorrhages secondary to amyloid angiopathy. 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 Moderate to severe chronic small vessel ischemic disease.  SUBJECTIVE (INTERVAL HISTORY) No family but her sitter  is at the bedside.  She is awake alert, disoriented and . BP is better controlled now. OBJECTIVE Temp:  [98.2 F (36.8 C)-98.8 F (37.1 C)] 98.8 F (37.1 C) (06/25 0405) Pulse Rate:  [77-94] 94 (06/25 0829) Cardiac Rhythm: Atrial fibrillation (06/25 1139) Resp:  [16-31] 20 (06/25 0405) BP: (124-174)/(70-124) 124/89 (06/25 1000) SpO2:  [98 %-100 %] 100 % (06/25 0405) Weight:  [146 lb 3.2 oz (66.3 kg)] 146 lb 3.2 oz (66.3 kg) (06/25 0405)  CBC:   Recent Labs Lab 01/11/17 1405 01/12/17 0700  WBC 5.8 7.2  NEUTROABS  --  4.7  HGB 11.6* 13.0  HCT 33.9* 39.7  MCV 68.9* 70.3*  PLT 234 177    Basic Metabolic Panel:   Recent Labs Lab 01/12/17 0700 01/13/17 1236 01/14/17 0420  NA 138 140 142  K 3.4* 3.4* 3.4*  CL 103 107 107  CO2 23 27 28   GLUCOSE 136* 212* 114*  BUN 12 12 9   CREATININE 1.07* 1.24* 1.23*  CALCIUM 8.7* 8.8* 8.8*  MG 1.6*  --   --     Lipid Panel:     Component Value Date/Time   CHOL 174 11/07/2016 0856   CHOL 142 10/12/2015 0928   TRIG 89 11/07/2016 0856   HDL 47 (L) 11/07/2016 0856   HDL 47 10/12/2015 0928   CHOLHDL 3.7 11/07/2016 0856   VLDL 18 11/07/2016 0856    LDLCALC 109 (H) 11/07/2016 0856   LDLCALC 79 10/12/2015 0928   HgbA1c:  Lab Results  Component Value Date   HGBA1C 6.9 (H) 11/07/2016   Urine Drug Screen:     Component Value Date/Time   LABOPIA NONE DETECTED 07/20/2016 2021   COCAINSCRNUR NONE DETECTED 07/20/2016 2021   LABBENZ NONE DETECTED 07/20/2016 2021   AMPHETMU NONE DETECTED 07/20/2016 2021   THCU NONE DETECTED 07/20/2016 2021   LABBARB NONE DETECTED 07/20/2016 2021    Alcohol Level No results found for: [place] I have personally reviewed the radiological images below and agree with the radiology interpretations.  Ct Head Wo Contrast 01/12/2017   Acute 12 x 13 x 14 mm RIGHT basal ganglia hemorrhage without significant mass effect. 12 x 13 mm faintly dense LEFT frontal lobe lesion could reflect subacute hemorrhage or, metastasis. MRI of the brain with contrast is recommended. Moderate to severe chronic small vessel ischemic disease and old lacunar infarcts.   Ct Angio Chest Pe W/cm &/or Wo Cm 01/11/2017 Filling defect is again noted in lower lobe branch of right pulmonary artery ; it is uncertain if this represents chronic or recurrent acute pulmonary embolus. Stable enlargement of pulmonary artery suggesting pulmonary artery hypertension. Moderate right pleural effusion is noted with adjacent subsegmental atelectasis. 4.8 cm ascending thoracic aortic aneurysm is noted. Ascending  thoracic aortic aneurysm. Recommend semi-annual imaging followup by CTA or MRA and referral to cardiothoracic surgery if not already obtained.   Mr Jeri Cos Wo Contrast 01/12/2017 Moderately motion degraded examination. Acute small RIGHT basal ganglia hemorrhage without mass effect. Subacute LEFT frontal lobe 14 x 10 mm hematoma. Extensive susceptibility artifact compatible with chronic hypertension though, there may be compartment amyloid angiopathy. 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 Moderate to severe chronic small vessel ischemic  disease.   Dg Chest Portable 1 View 01/11/2017 1. Stable cardiomegaly without overt pulmonary edema .  2. Hazy curvilinear bibasilar lung opacities, favor scarring or atelectasis.   TTE 09/3543 Normal LV systolic function; mild AI; mildly dilated ascending   aorta (4.3 cm); mild MR; severe biatrial enlargement; mild RVE;   mild TR with mildly elevated pulmonary pressure.    PHYSICAL EXAM  Temp:  [98.2 F (36.8 C)-98.8 F (37.1 C)] 98.8 F (37.1 C) (06/25 0405) Pulse Rate:  [77-94] 94 (06/25 0829) Resp:  [16-31] 20 (06/25 0405) BP: (124-174)/(70-124) 124/89 (06/25 1000) SpO2:  [98 %-100 %] 100 % (06/25 0405) Weight:  [146 lb 3.2 oz (66.3 kg)] 146 lb 3.2 oz (66.3 kg) (06/25 0405)  General - Well nourished, well developed, in no apparent distress.  Ophthalmologic - Fundi not visualized due to noncooperation.  Cardiovascular - irregularly irregular heart rate and rhythm, tele showed aflutter with RVR  Mental Status -  Awake alert, oriented to self,only   Speech is slightly hesitant but fluent. Diminished attention, registration and recall. Poor insight into her condition. Will follow midline and simple one-step commands only.  Cranial Nerves II - XII - II - Visual field intact OU. III, IV, VI - Extraocular movements intact. V - Facial sensation intact bilaterally. VII - Facial movement intact bilaterally. VIII - Hearing & vestibular intact bilaterally. X - Palate elevates symmetrically. XI - Chin turning & shoulder shrug intact bilaterally. XII - Tongue protrusion intact.  Motor Strength - The patient's strength was normal in all extremities and pronator drift was absent.  Bulk was normal and fasciculations were absent.   Motor Tone - Muscle tone was assessed at the neck and appendages and was normal.  Reflexes - The patient's reflexes were 1+ in all extremities and she had no pathological reflexes.  Sensory - Light touch, temperature/pinprick were assessed and were  symmetrical.    Coordination - The patient had normal movements in the hands with no ataxia or dysmetria.  Tremor was absent.  Gait and Station - not tested.   ASSESSMENT/PLAN [name] is a 83 y.o. female with history of dementia, afib and recent PE on eliquis, history of ICH, HTN, CKD, DM presenting with generalized weakness, lethargy, and elevated BP.   ICH:  bilateral ICH with right thalamus acute and left frontal subacute, likely due to high BP and small vessel disease in the setting of eliquis for PE treatment.   Resultant  Generalized weakness, lethargy  CT head right thalamus acute and left frontal subacute ICH  MRI head Acute small RIGHT basal ganglia hemorrhage without mass effect. Subacute LEFT frontal lobe 14 x 10 mm hematoma. Extensive susceptibility artifact compatible with chronic hypertension   2D Echo  08/2016 EF 55-60%  LDL 109 in 10/2016  HgbA1c 6.9 in 10/2016  SCDs for VTE prophylaxis Diet regular Room service appropriate? Yes; Fluid consistency: Thin  Eliquis (apixaban) daily prior to admission, now on No antithrombotic  Ongoing aggressive stroke risk factor management  Therapy recommendations:  pending  Disposition:  Pending  Hypertensive emergency  Unstable, still on the high side  Due to recurrent ICH, BP goal < 140 for first 24 hours followed by less than 180  On clonidine, HCTZ, hydralazine, metoprolol and lisinopril  Hydralazine and labetalol injection PRN     PE  Diagnosed in 07/2016 and put on eliquis 5mg  bid  Repeat CTA chest this admission right LL PE, not sure chronic or recurrent PE this time  However, off eliquis now due to acute ICH  Close monitoring  Respiratory condition stable at this time  Hx of ICH on coumadin  03/2015 right caudate ICH with IVH, INR 3.06 - EF 55-60% LDL 97 and A1C 5.9  Pt was taken off coumadin and continued on ASA once hematoma resolved  Follows with Dr. Leonie Man at [facility]  Chronic afib  Was on  coumadin but switched to ASA due to [place] in 03/2015  However, started eliquis 07/2016 due to PE  Currently still has afib with intermittent RVR  On metoprolol for rate control  Hold off antiplatelet and anticoagulation at this time due to [place]  Hyperlipidemia  Home meds: none  LDL 109 in 10/2016, goal < 70  Will not initiate statin therapy at this time  Diabetes  HgbA1c 6.9 in 10/2016, goal < 7.0  Uncontrolled  Hyperglycemia  Manage per primary team  Other Stroke Risk Factors  Advanced age  Other Active Problems  Dementia   4.8 cm ascending thoracic aortic aneurysm -> referral to cardiothoracic surgery if not already obtained.  CKD stage II  Hypokalemia - K 3.4  Followed with Dr Leonie Man at [facility] day # 2  I have personally examined this patient, reviewed notes, independently viewed imaging studies, participated in medical decision making and plan of care.ROS completed by me personally and pertinent positives fully documented  I have made any additions or clarifications directly to the above note.  The patient has had intracerebral hemorrhage on warfarin in the past and now on eliquis and hence she is not a candidate for long-term anticoagulation despite having recent pulmonary embolism. I recommend discussion with family about goals of care and if they want to be aggressive consider putting in an IVC filter. Discussed  with Dr. Wendee Beavers. Recommend discuss goals of care with family and palliative care consult would be appropriate Greater than 50% time during this 25 minute visit was spent on coordination of care about her atrial fibrillation, stroke risk, bleeding risk on anticoagulants.  Antony Contras, MD Medical Director [facility] Pager: [phone] 01/14/2017 2:38 PM     To contact Stroke Continuity provider, please refer to [email or web address] After hours, contact General Neurology

## 2017-01-15 LAB — BASIC METABOLIC PANEL
Anion gap: 6 (ref 5–15)
BUN: 10 mg/dL (ref 6–20)
CALCIUM: 8.7 mg/dL — AB (ref 8.9–10.3)
CO2: 27 mmol/L (ref 22–32)
Chloride: 107 mmol/L (ref 101–111)
Creatinine, Ser: 1.13 mg/dL — ABNORMAL HIGH (ref 0.44–1.00)
GFR calc non Af Amer: 44 mL/min — ABNORMAL LOW (ref >60)
GFR, EST AFRICAN AMERICAN: 51 mL/min — AB (ref >60)
Glucose, Bld: 123 mg/dL — ABNORMAL HIGH (ref 65–99)
Potassium: 3.7 mmol/L (ref 3.5–5.1)
SODIUM: 140 mmol/L (ref 135–145)

## 2017-01-15 LAB — GLUCOSE, CAPILLARY
GLUCOSE-CAPILLARY: 130 mg/dL — AB (ref 65–99)
Glucose-Capillary: 148 mg/dL — ABNORMAL HIGH (ref 65–99)
Glucose-Capillary: 134 mg/dL — ABNORMAL HIGH (ref 65–99)
Glucose-Capillary: 117 mg/dL — ABNORMAL HIGH (ref 65–99)

## 2017-01-15 MED ORDER — CLONIDINE HCL 0.2 MG PO TABS
0.2000 mg | ORAL_TABLET | Freq: Three times a day (TID) | ORAL | Status: DC
  Administered 2017-01-15 – 2017-01-16 (×3): 0.2 mg via ORAL
  Filled 2017-01-15 (×3): qty 1

## 2017-01-15 MED ORDER — ISOSORBIDE MONONITRATE ER 60 MG PO TB24
60.0000 mg | ORAL_TABLET | Freq: Every day | ORAL | Status: DC
  Administered 2017-01-16 – 2017-01-17 (×2): 60 mg via ORAL
  Filled 2017-01-15 (×2): qty 1

## 2017-01-15 NOTE — Care Management Note (Signed)
Case Management Note  Patient Details  Name: Nicole [name] MRN: [id number] Date of Birth: Oct 10, 1930  Subjective/Objective:                 Spoke with patient and friend Hermon at the bedside. They would like to use [facility] for [facility] RN PT OT HHA. Referral placed to [facility], clinical liaison. Patient has 24 hour supervision from friend Herman/ or children. Patient has private duty sitter 6 hours a day per [facility] on M-F. Patient has RW and tub bench at home, no other DME rec by PT, patient decline need as well.    Action/Plan:  Anticipate DC to home w [facility]. Text paged Dr Wendee Beavers for [facility] PT OT RN HHA orders.   Expected Discharge Date:                  Expected Discharge Plan:  [place]  In-House Referral:     Discharge planning Services  CM Consult  Post Acute Care Choice:  Home Health Choice offered to:  Patient  DME Arranged:    DME Agency:     HH Arranged:  RN, PT, OT, Nurse's Aide [place] Agency:  [facility] (now [facility])  Status of Service:  In process, will continue to follow  If discussed at Long Length of Stay Meetings, dates discussed:    Additional Comments:  Carles Collet, RN 01/15/2017, 2:19 PM

## 2017-01-15 NOTE — Progress Notes (Signed)
Patient ID: Nicole [name], female   DOB: Mar 19, 1931, 83 y.o.   MRN: [id number]  Request for consideration of IVC filter placement  Original PE diagnosed in 08/2016 Was started on Eliquis then Filling defect again noted 0/14/10-- uncertain chronic or recurrent acute PE  Hx Intracranial hemorrhage--- now new acute; dementia; Afib; Hx GI Bleed; CKD Neurology recommends stopping Eliquis---never to anticoagulate again Question is if IVC filter is needed.  Vascular Ultrasound 01/14/17: Lower extremity venous duplex has been completed.  Preliminary findings: No evidence of DVT or baker's cyst.  I have discussed case with Dr Annamaria Boots He has reviewed all imaging and Vasc US. Both studies represent microscopic emboli  Rec: No filter needed and No anticoagulation is needed for these microscopic pulmonary emboli  Reported to Dr Wendee Beavers With good understanding Any questions or concerns- call Dr Annamaria Boots at [phone]

## 2017-01-15 NOTE — Progress Notes (Signed)
PROGRESS NOTE    Nicole [name]  GGY:[id number] DOB: 10-26-1930 DOA: 01/11/2017 PCP: [name], DO    Brief Narrative:  83 y.o. female with complicated past medical history significant for history of intracranial hemorrhage, vascular dementia, chronic atrial fibrillation and PE on Eliquis, history of GI bleed, hypertension, chronic kidney disease stage II-III, and hypothyroidism, now presented to the emergency department at the direction of her PCP for evaluation of generalized weakness, lethargy, and respiratory distress.  Upon further evaluation patient was found to have multiple areas and brain with hemorrhaging. Which includes acute small right basal ganglia hemorrhage without mass effect left frontal lobe hematoma, old hemorrhagic right basal ganglion infarct. Case was discussed with neurosurgery who recommended stopping eliquis  Assessment & Plan:   Principal Problem: Basal ganglia hemorrhage: Secondary to amyloid angiopathy - Patient has multiple areas of hemorrhage. I have consulted neurology and they will assist with further management. Patient should never be anticoagulated given that she has a propensity for bleeding into her brain. - Tight blood pressure control was recommended by neurology. Will continue oral regimen and have PRN hydralazine for goal SBP of > 140. - Blood pressure medication was adjusted including increasing clonidine and beta blocker doses. Unfortunately patient developed bradycardia and as such I have lowered clonidine dose and increased Imdur.  Bradycardia - Lowered clonidine dose. We'll monitor overnight to see if patient has another episode of bradycardia. If stable will continue to lower clonidine while on higher dose of Imdur. Could discontinue the clonidine altogether and consider amlodipine. Again will monitor overnight and reassess next a.m. - CVC strip assessed in chart and reporting PVCs    Generalized weakness - The patient has many serious  active medical problems which make her prognosis poor. I discussed with family. Patient has several intracerebral hemorrhages. Please refer to MRI for details. Obtain palliative care for goals of care. - obtained physical therapy evaluation who is recommending Home health PT  Active Problems:   Anemia - Resolved on repeat blood tests    hypokalemia - Replace orally and resolved    Thoracic aortic aneurysm, 4.8cm 2011   HTN (hypertension) - Continue current antihypertensive regimen: clonidine, imdur, lisinopril, metoprolol - Goal is for systolic blood pressure 035 or less    Acute respiratory distress - resolved on supplemental oxygen    ICH (intracerebral hemorrhage) ([place]) - Was discussed with neurosurgeon and neurology - Eliquis discontinued    Chronic atrial fibrillation ([place]) - Not recommended to use anticoagulation given above - Continue rate control metoprolol    Hypertensive urgency - Continue home blood pressure medications and will add when necessary hydralazine    Type 2 diabetes mellitus with peripheral neuropathy ([place])   Vascular dementia    Hypothyroidism due to acquired atrophy of thyroid - Stable on Synthroid    Pulmonary embolus (H[facility]) - no longer anticoagulant candidate - Palliative care consulted for goals of care - Radiologist was consulted for consideration of IVC filter. After further evaluation patient is not candidate. Lower extremity duplex was obtained which was negative for DVTs   DVT prophylaxis: Place SCDs Code Status: DO NOT RESUSCITATE  Family Communication: discussed with sister and daughter again today Disposition Plan: Discharge next a.m. if patient's blood pressure well controlled under 009 systolic. Adjusting medications to avoid bradycardia.   Consultants:   Neurology  Palliative care   Procedures: None   Antimicrobials:None   Subjective: No new complaints reported today. Pt looking forward to going home  soon.  Objective:  Vitals:   01/15/17 0835 01/15/17 1008 01/15/17 1116 01/15/17 1534  BP:  120/82 132/71 126/69  Pulse: 79     Resp:  19 (!) 25 16  Temp:   97.9 F (36.6 C) 97.6 F (36.4 C)  TempSrc:   Oral Oral  SpO2:   96% 100%  Weight:      Height:        Intake/Output Summary (Last 24 hours) at 01/15/17 1934 Last data filed at 01/15/17 0500  Gross per 24 hour  Intake                0 ml  Output                0 ml  Net                0 ml   Filed Weights   01/13/17 0432 01/14/17 0405 01/15/17 0448  Weight: 66.8 kg (147 lb 3.2 oz) 66.3 kg (146 lb 3.2 oz) 67 kg (147 lb 11.2 oz)    Examination:  General exam: Pt in nad, alert and awake Respiratory system: Equal chest rise, no wheezes, no increased work of breathing Cardiovascular system: irregular rate and rhythm, no murmurs rubs  Telemetry: CV strip showing A. fib with PVCs Gastrointestinal system: Abdomen is nondistended, soft and nontender. No organomegaly or masses felt. Normal bowel sounds heard. Central nervous system:  no facial asymmetry, Alert and Awake Extremities: warm, dry Skin: No rashes, lesions or ulcers, on limited exam. Psychiatry:  Mood & affect appropriate.   Data Reviewed: I have personally reviewed following labs and imaging studies  CBC:  Recent Labs Lab 01/11/17 1405 01/11/17 2123 01/12/17 0700  WBC 5.8  --  7.2  NEUTROABS  --   --  4.7  HGB 11.6*  --  13.0  HCT 33.9* 34.2 39.7  MCV 68.9*  --  70.3*  PLT 234  --  824   Basic Metabolic Panel:  Recent Labs Lab 01/11/17 1405 01/12/17 0700 01/13/17 1236 01/14/17 0420 01/15/17 0433  NA 139 138 140 142 140  K 3.4* 3.4* 3.4* 3.4* 3.7  CL 104 103 107 107 107  CO2 26 23 27 28 27   GLUCOSE 127* 136* 212* 114* 123*  BUN 12 12 12 9 10   CREATININE 1.07* 1.07* 1.24* 1.23* 1.13*  CALCIUM 9.0 8.7* 8.8* 8.8* 8.7*  MG  --  1.6*  --   --   --    GFR: Estimated Creatinine Clearance: 38.1 mL/min (A) (by C-G formula based on SCr of 1.13  mg/dL (H)). Liver Function Tests: No results for input(s): AST, ALT, ALKPHOS, BILITOT, PROT, ALBUMIN in the last 168 hours. No results for input(s): LIPASE, AMYLASE in the last 168 hours.  Recent Labs Lab 01/11/17 2123  AMMONIA 24   Coagulation Profile: No results for input(s): INR, PROTIME in the last 168 hours. Cardiac Enzymes:  Recent Labs Lab 01/11/17 2123 01/12/17 0055 01/12/17 0700  TROPONINI <0.03 <0.03 <0.03   BNP (last 3 results) No results for input(s): PROBNP in the last 8760 hours. HbA1C: No results for input(s): HGBA1C in the last 72 hours. CBG:  Recent Labs Lab 01/14/17 1610 01/14/17 2147 01/15/17 0745 01/15/17 1142 01/15/17 1626  GLUCAP 183* 167* 130* 148* 134*   Lipid Profile: No results for input(s): CHOL, HDL, LDLCALC, TRIG, CHOLHDL, LDLDIRECT in the last 72 hours. Thyroid Function Tests: No results for input(s): TSH, T4TOTAL, FREET4, T3FREE, THYROIDAB in the last 72 hours. Anemia Panel: No  results for input(s): VITAMINB12, FOLATE, FERRITIN, TIBC, IRON, RETICCTPCT in the last 72 hours. Sepsis Labs:  Recent Labs Lab 01/11/17 1505 01/11/17 1932  LATICACIDVEN 1.49 1.27    Recent Results (from the past 240 hour(s))  Urine culture     Status: Abnormal   Collection Time: 01/11/17  5:26 PM  Result Value Ref Range Status   Specimen Description URINE, RANDOM  Final   Special Requests NONE  Final   Culture (A)  Final    >=100,000 COLONIES/mL LACTOBACILLUS SPECIES Standardized susceptibility testing for this organism is not available.    Report Status 01/13/2017 FINAL  Final      Radiology Studies: No results found.  Scheduled Meds: . allopurinol  100 mg Oral Daily  . cloNIDine  0.2 mg Oral TID  . donepezil  10 mg Oral QHS  . hydrALAZINE  50 mg Oral Q8H  . [START ON 01/16/2017] isosorbide mononitrate  60 mg Oral Daily  . levothyroxine  50 mcg Oral QAC breakfast  . lisinopril  20 mg Oral Daily  . metoprolol tartrate  75 mg Oral BID  .  sodium chloride flush  3 mL Intravenous Q12H   Continuous Infusions:   LOS: 3 days   Time spent: > 35 minutes  Velvet Bathe, MD [facility] Pager [phone]  If 7PM-7AM, please contact night-coverage [email or web address] Password [facility] 01/15/2017, 7:34 PM

## 2017-01-15 NOTE — Progress Notes (Signed)
STROKE TEAM PROGRESS NOTE   HISTORY OF PRESENT ILLNESS (per record) Nicole [name] is an 83 y.o. female with a prior history of ICH, advanced vascular dementia, CKD, HTN and hypothyroidism, on Eliquis for chronic atrial fibrillation and PE, who presented to the ED for evaluation of generalized weakness, respiratory distress and lethargy. She was hypertensive in the ED.   MRI revealed an acute small RIGHT basal ganglia hemorrhage without mass effect. Subacute LEFT frontal lobe 14 x 10 mm hematoma is also noted. Multifocal hypointense lesions on SWAN images are most consistent with microhemorrhages secondary to amyloid angiopathy. 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 Moderate to severe chronic small vessel ischemic disease.  SUBJECTIVE (INTERVAL HISTORY) No family but her sitter  is at the bedside.  She is awake alert, disoriented and . BP is better controlled now.Interventional radiology feel no IVC filter is indicated since her pulm emboli are small and microscopic. OBJECTIVE Temp:  [97.9 F (36.6 C)-98.1 F (36.7 C)] 97.9 F (36.6 C) (06/26 1116) Pulse Rate:  [61-79] 79 (06/26 0835) Cardiac Rhythm: Atrial flutter (06/26 0825) Resp:  [17-25] 25 (06/26 1116) BP: (101-148)/(62-97) 132/71 (06/26 1116) SpO2:  [96 %-100 %] 96 % (06/26 1116) Weight:  [147 lb 11.2 oz (67 kg)] 147 lb 11.2 oz (67 kg) (06/26 0448)  CBC:   Recent Labs Lab 01/11/17 1405 01/11/17 2123 01/12/17 0700  WBC 5.8  --  7.2  NEUTROABS  --   --  4.7  HGB 11.6*  --  13.0  HCT 33.9* 34.2 39.7  MCV 68.9*  --  70.3*  PLT 234  --  967    Basic Metabolic Panel:   Recent Labs Lab 01/12/17 0700  01/14/17 0420 01/15/17 0433  NA 138  < > 142 140  K 3.4*  < > 3.4* 3.7  CL 103  < > 107 107  CO2 23  < > 28 27  GLUCOSE 136*  < > 114* 123*  BUN 12  < > 9 10  CREATININE 1.07*  < > 1.23* 1.13*  CALCIUM 8.7*  < > 8.8* 8.7*  MG 1.6*  --   --   --   < > = values in this interval not displayed.  Lipid Panel:     Component Value Date/Time   CHOL 174 11/07/2016 0856   CHOL 142 10/12/2015 0928   TRIG 89 11/07/2016 0856   HDL 47 (L) 11/07/2016 0856   HDL 47 10/12/2015 0928   CHOLHDL 3.7 11/07/2016 0856   VLDL 18 11/07/2016 0856   LDLCALC 109 (H) 11/07/2016 0856   LDLCALC 79 10/12/2015 0928   HgbA1c:  Lab Results  Component Value Date   HGBA1C 6.9 (H) 11/07/2016   Urine Drug Screen:     Component Value Date/Time   LABOPIA NONE DETECTED 07/20/2016 2021   COCAINSCRNUR NONE DETECTED 07/20/2016 2021   LABBENZ NONE DETECTED 07/20/2016 2021   AMPHETMU NONE DETECTED 07/20/2016 2021   THCU NONE DETECTED 07/20/2016 2021   LABBARB NONE DETECTED 07/20/2016 2021    Alcohol Level No results found for: [place] I have personally reviewed the radiological images below and agree with the radiology interpretations.  Ct Head Wo Contrast 01/12/2017   Acute 12 x 13 x 14 mm RIGHT basal ganglia hemorrhage without significant mass effect. 12 x 13 mm faintly dense LEFT frontal lobe lesion could reflect subacute hemorrhage or, metastasis. MRI of the brain with contrast is recommended. Moderate to severe chronic small vessel ischemic disease and  old lacunar infarcts.   Ct Angio Chest Pe W/cm &/or Wo Cm 01/11/2017 Filling defect is again noted in lower lobe branch of right pulmonary artery ; it is uncertain if this represents chronic or recurrent acute pulmonary embolus. Stable enlargement of pulmonary artery suggesting pulmonary artery hypertension. Moderate right pleural effusion is noted with adjacent subsegmental atelectasis. 4.8 cm ascending thoracic aortic aneurysm is noted. Ascending thoracic aortic aneurysm. Recommend semi-annual imaging followup by CTA or MRA and referral to cardiothoracic surgery if not already obtained.   Mr Jeri Cos Wo Contrast 01/12/2017 Moderately motion degraded examination. Acute small RIGHT basal ganglia hemorrhage without mass effect. Subacute LEFT frontal lobe 14 x 10 mm  hematoma. Extensive susceptibility artifact compatible with chronic hypertension though, there may be compartment amyloid angiopathy. 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 Moderate to severe chronic small vessel ischemic disease.   Dg Chest Portable 1 View 01/11/2017 1. Stable cardiomegaly without overt pulmonary edema .  2. Hazy curvilinear bibasilar lung opacities, favor scarring or atelectasis.   TTE 02/2992 Normal LV systolic function; mild AI; mildly dilated ascending   aorta (4.3 cm); mild MR; severe biatrial enlargement; mild RVE;   mild TR with mildly elevated pulmonary pressure.    PHYSICAL EXAM  Temp:  [97.9 F (36.6 C)-98.1 F (36.7 C)] 97.9 F (36.6 C) (06/26 1116) Pulse Rate:  [61-79] 79 (06/26 0835) Resp:  [17-25] 25 (06/26 1116) BP: (101-148)/(62-97) 132/71 (06/26 1116) SpO2:  [96 %-100 %] 96 % (06/26 1116) Weight:  [147 lb 11.2 oz (67 kg)] 147 lb 11.2 oz (67 kg) (06/26 0448)  General - Well nourished, well developed, in no apparent distress.  Ophthalmologic - Fundi not visualized due to noncooperation.  Cardiovascular - irregularly irregular heart rate and rhythm, tele showed aflutter with RVR  Mental Status -  Awake alert, oriented to self,only   Speech is slightly hesitant but fluent. Diminished attention, registration and recall. Poor insight into her condition. Will follow midline and simple one-step commands only.  Cranial Nerves II - XII - II - Visual field intact OU. III, IV, VI - Extraocular movements intact. V - Facial sensation intact bilaterally. VII - Facial movement intact bilaterally. VIII - Hearing & vestibular intact bilaterally. X - Palate elevates symmetrically. XI - Chin turning & shoulder shrug intact bilaterally. XII - Tongue protrusion intact.  Motor Strength - The patient's strength was normal in all extremities and pronator drift was absent.  Bulk was normal and fasciculations were absent.   Motor Tone - Muscle tone was  assessed at the neck and appendages and was normal.  Reflexes - The patient's reflexes were 1+ in all extremities and she had no pathological reflexes.  Sensory - Light touch, temperature/pinprick were assessed and were symmetrical.    Coordination - The patient had normal movements in the hands with no ataxia or dysmetria.  Tremor was absent.  Gait and Station - not tested.   ASSESSMENT/PLAN Nicole [name] is a 83 y.o. female with history of dementia, afib and recent PE on eliquis, history of ICH, HTN, CKD, DM presenting with generalized weakness, lethargy, and elevated BP.   ICH:  bilateral ICH with right thalamus acute and left frontal subacute, likely due to high BP and small vessel disease in the setting of eliquis for PE treatment.   Resultant  Generalized weakness, lethargy  CT head right thalamus acute and left frontal subacute ICH  MRI head Acute small RIGHT basal ganglia hemorrhage without mass effect. Subacute LEFT frontal  lobe 14 x 10 mm hematoma. Extensive susceptibility artifact compatible with chronic hypertension   2D Echo  08/2016 EF 55-60%  LDL 109 in 10/2016  HgbA1c 6.9 in 10/2016  SCDs for VTE prophylaxis Diet regular Room service appropriate? Yes; Fluid consistency: Thin  Eliquis (apixaban) daily prior to admission, now on No antithrombotic  Ongoing aggressive stroke risk factor management  Therapy recommendations:  pending  Disposition:  Pending  Hypertensive emergency  Unstable, still on the high side  Due to recurrent ICH, BP goal < 140 for first 24 hours followed by less than 180  On clonidine, HCTZ, hydralazine, metoprolol and lisinopril  Hydralazine and labetalol injection PRN     PE  Diagnosed in 07/2016 and put on eliquis 5mg  bid  Repeat CTA chest this admission right LL PE, not sure chronic or recurrent PE this time  However, off eliquis now due to acute ICH  Close monitoring  Respiratory condition stable at this time  Hx of  ICH on coumadin  03/2015 right caudate ICH with IVH, INR 3.06 - EF 55-60% LDL 97 and A1C 5.9  Pt was taken off coumadin and continued on ASA once hematoma resolved  Follows with Dr. Leonie Man at [facility]  Chronic afib  Was on coumadin but switched to ASA due to [place] in 03/2015  However, started eliquis 07/2016 due to PE  Currently still has afib with intermittent RVR  On metoprolol for rate control  Hold off antiplatelet and anticoagulation at this time due to [place]  Hyperlipidemia  Home meds: none  LDL 109 in 10/2016, goal < 70  Will not initiate statin therapy at this time  Diabetes  HgbA1c 6.9 in 10/2016, goal < 7.0  Uncontrolled  Hyperglycemia  Manage per primary team  Other Stroke Risk Factors  Advanced age  Other Active Problems  Dementia   4.8 cm ascending thoracic aortic aneurysm -> referral to cardiothoracic surgery if not already obtained.  CKD stage II  Hypokalemia - K 3.4  Followed with Dr Leonie Man at [facility] day # 3     The patient has had intracerebral hemorrhage on warfarin in the past and now on eliquis and hence she is not a candidate for long-term anticoagulation despite having recent pulmonary embolism Radiology feels IVC filter is not necessary and lower extremity venous Dopplers are negative for any fresh DVT. Recommend discuss goals of care with family and palliative care consult would be appropriate  Stroke team will sign off. Call for questions. D/w Dr Wendee Beavers   Antony Contras, MD Medical Director [place] Pager: [phone] 01/15/2017 1:09 PM     To contact Stroke Continuity provider, please refer to [email or web address] After hours, contact General Neurology

## 2017-01-15 NOTE — Progress Notes (Signed)
Patient ID: Nicole [name], female   DOB: [birth year], 83 y.o.   MRN: [id number]  This NP visited patient at the bedside as a follow up to  [place].  Patient OOB in chair. [name] at bedside.  Family tell me the plan is for patient to return home when medically stable with home health services  I placed a call to Gloria/daughter/HPOA as f/u and to address questions and concerns.    Shared my concern that patient is high risk for decompensation.  Discussed the importance of continued conversation with  their  medical providers regarding overall plan of care and treatment options,  ensuring decisions are within the context of the patients values and GOCs.  Questions and concerns addressed  Time in 1500          Time out  1520            Total time spent on the unit was 20 minutes  Greater than 50% of the time was spent in counseling and coordination of care  Wadie Lessen NP  Palliative Medicine Team Team Phone # [phone] Pager [phone]

## 2017-01-15 NOTE — Progress Notes (Signed)
Spoke with MD Wendee Beavers notified of pt hr dropping to 30s non-sustaining as well as pt having 2-3s pauses overnignt. MD also notified of night RN  Holding lopressor. VS stable & charted. Will continue to monitor pt & await new orders to adjust meds. [place]

## 2017-01-15 NOTE — Progress Notes (Signed)
  Speech Language Pathology Treatment: Dysphagia  Patient Details Name: ANNAIS CRAFTS MRN: [id number] DOB: Dec 20, 1930 Today's Date: 01/15/2017 Time: 0141-0301 SLP Time Calculation (min) (ACUTE ONLY): 15 min  Assessment / Plan / Recommendation Clinical Impression  Dysphagia treatment provided to check diet tolerance. Pt consumed regular solid and thin liquid trials without overt s/s of aspiration. Pt independent in taking 1 sip at a time and small bites at a time as recommended during initial bedside evaluation on 6/24. Pt did show prolonged mastication of regular solid but this did not appear to impact safety. The pt's home sitter was at bedside during treatment session; reported that previous date pt consumed meal without notable difficulty. Aspiration risk appears mild at this time. Recommend continuing regular diet, thin liquids by cup/ no straws, meds whole in puree, intermittent supervision to ensure pt seated upright/ taking small bites and sips. SLP will sign off at this time. Please re-consult if needs arise.  HPI HPI: Shalese Strahan Butleris a 83 y.o.femalewith complicated past medical history significant for history of intracranial hemorrhage, vascular dementia, chronic atrial fibrillation and PE on Eliquis, history of GI bleed, hypertension, chronic kidney disease stage II-III, and hypothyroidism, now presented to the emergency department at the direction of her PCP for evaluation of generalized weakness, lethargy, and respiratory distress. Patient is accompanied by family who assist with the history. She had reportedly been ambulatory until approximately one week ago. Since that time, she has been increasingly weak generally, and lethargic per her family. She has had poor appetite over this interval. Patient does not voice any complaints. She lives at home with caretakers. Today, she was so weak, that she had to be lifted out of the bed to go see her PCP. There has not been any fevers reported,  no vomiting or diarrhea, no fall or trauma, and the patient was not noted to have any apparent respiratory distress until she was at her PCPs office today for evaluation of lethargy. She was noted to be tachypneic with labored breathing, but good O2 saturations. She was directed to the ED for further evaluation of this.  Most recent MRI of the head is showing small acute right basal ganglia hemorrhage wtihout mass effect, subacute left frontal lobe hematoma and old hemorrhage right basla ganglia infarct.  Patient with previous ST services following [place] with previous MBS dated 04/11/2015 with recommendation for a dysphagia 3 diet with thin liquids.       SLP Plan  All goals met       Recommendations  Diet recommendations: Regular;Thin liquid Liquids provided via: Cup;No straw Medication Administration: Whole meds with puree Supervision: Patient able to self feed;Intermittent supervision to cue for compensatory strategies Compensations: Slow rate;Small sips/bites Postural Changes and/or Swallow Maneuvers: Seated upright 90 degrees                Oral Care Recommendations: Oral care BID Follow up Recommendations: 24 hour supervision/assistance SLP Visit Diagnosis: Dysphagia, unspecified (R13.10) Plan: All goals met       [place], [place], CCC-SLP 01/15/2017, 11:55 AM [id number]

## 2017-01-15 NOTE — Plan of Care (Signed)
Problem: Safety: Goal: Ability to remain free from injury will improve Outcome: Progressing No incidence of falls during this admission. Call bell within reach. Bed in low and locked position. Clean and clear environment maintained. Nonskid footwear being utilized. Patient up in chair sleeping at this time. Patient verbalized understanding of safety instruction.  Problem: Education: Goal: Knowledge of disease or condition will improve Outcome: Progressing Family has good understanding of disease and how we are treating it. Patient is pleasantly confused at this time.

## 2017-01-15 NOTE — Progress Notes (Signed)
Sonia with telemetry has called me multiple times within the last two hours to report 4 2-2.5 second pauses in the patient's rhythm. About 30 minutes ago, she called to report patient's HR dipped to 38 bpm. Patient did not sustain, but patient has been running 38-65 Afib on the monitor. I paged Baltazar Najjar, on call with [facility], to make her aware of all of the above. I did not receive any new orders and will continue to monitor the patient. Currently, patient is sleeping in her chair. She is easily arouseable and does not have any complaints. Patient's dose of metoprolol was increased from 50 mg BID to 75 mg BID today for the first time. I gave the second dose.

## 2017-01-16 LAB — GLUCOSE, CAPILLARY
GLUCOSE-CAPILLARY: 101 mg/dL — AB (ref 65–99)
GLUCOSE-CAPILLARY: 123 mg/dL — AB (ref 65–99)
Glucose-Capillary: 128 mg/dL — ABNORMAL HIGH (ref 65–99)
Glucose-Capillary: 153 mg/dL — ABNORMAL HIGH (ref 65–99)

## 2017-01-16 MED ORDER — HYDRALAZINE HCL 25 MG PO TABS
25.0000 mg | ORAL_TABLET | Freq: Four times a day (QID) | ORAL | Status: DC
  Administered 2017-01-16: 25 mg via ORAL
  Filled 2017-01-16: qty 1

## 2017-01-16 MED ORDER — HYDRALAZINE HCL 50 MG PO TABS
50.0000 mg | ORAL_TABLET | Freq: Four times a day (QID) | ORAL | Status: DC
  Administered 2017-01-16 – 2017-01-17 (×4): 50 mg via ORAL
  Filled 2017-01-16 (×4): qty 1

## 2017-01-16 MED ORDER — AMLODIPINE BESYLATE 5 MG PO TABS
5.0000 mg | ORAL_TABLET | Freq: Every day | ORAL | Status: DC
  Administered 2017-01-16 – 2017-01-17 (×2): 5 mg via ORAL
  Filled 2017-01-16 (×2): qty 1

## 2017-01-16 NOTE — Progress Notes (Signed)
Physical Therapy Treatment Patient Details Name: [name] MRN: [id number] DOB: 1931/01/11 Today's Date: 01/16/2017    History of Present Illness Nicole [name] is a 83 y.o. female with complicated past medical history significant for history of intracranial hemorrhage, Lt-sided weakness, vascular dementia, chronic atrial fibrillation and PE on Eliquis, history of GI bleed, hypertension, chronic kidney disease stage II-III, and hypothyroidism.  Patient is admitted 01/11/17 for evaluation of generalized weakness, lethargy, and respiratory distress.   Patient also with HTN.  MRI showed multiple areas of hemorrhage.    PT Comments    Patient progressing well towards PT goals. Reports feeling stronger today and eager to walk. Improved ambulation distance with min guard assist for safety. Continues to exhibit 2/4 DOE. Sp02 90% on RA. HR 70-90s bpm.  Sitting BP 127/83 Sitting BP post ambulation 132/96 Encouraged ambulation with RN 2 more times today. Will plan for stair training next session as tolerated. Will follow acutely.   Follow Up Recommendations  Home health PT;Supervision/Assistance - 24 hour     Equipment Recommendations  None recommended by PT    Recommendations for Other Services       Precautions / Restrictions Precautions Precautions: Fall Precaution Comments: Lt-sided weakness Restrictions Weight Bearing Restrictions: No    Mobility  Bed Mobility Overal bed mobility: Needs Assistance Bed Mobility: Supine to Sit;Sit to Supine     Supine to sit: Supervision;HOB elevated Sit to supine: Supervision;HOB elevated   General bed mobility comments: Increased time to complete.  Cues to progress to edge of bed.  Use of rail for support.   Transfers Overall transfer level: Needs assistance Equipment used: Rolling walker (2 wheeled) Transfers: Sit to/from Stand Sit to Stand: Min assist         General transfer comment: Cues for hand placement to and from seated  surface as pt wanting to pull up on RW.   Ambulation/Gait Ambulation/Gait assistance: Min guard Ambulation Distance (Feet): 120 Feet Assistive device: Rolling walker (2 wheeled) Gait Pattern/deviations: Decreased stride length;Shuffle;Step-through pattern;Trunk flexed Gait velocity: decreased   General Gait Details: Cues for RW management esp with turns. very slow to turn. 2/4 DOE.    Stairs            Wheelchair Mobility    Modified Rankin (Stroke Patients Only) Modified Rankin (Stroke Patients Only) Pre-Morbid Rankin Score: Moderate disability Modified Rankin: Moderately severe disability     Balance Overall balance assessment: Needs assistance Sitting-balance support: No upper extremity supported;Feet supported Sitting balance-Leahy Scale: Good     Standing balance support: During functional activity Standing balance-Leahy Scale: Fair Standing balance comment: Does better with UE support for balance but able to stand statically and blow nose without LOB.                            Cognition Arousal/Alertness: Awake/alert Behavior During Therapy: WFL for tasks assessed/performed Overall Cognitive Status: History of cognitive impairments - at baseline                                 General Comments: Very pleasant and knew she hadn't walked with anyone since therapy 2 days ago.      Exercises      General Comments General comments (skin integrity, edema, etc.): Son present during session. HR 70-90s bpm.       Pertinent Vitals/Pain Pain Assessment: No/denies pain  Home Living                      Prior Function            PT Goals (current goals can now be found in the care plan section) Progress towards PT goals: Progressing toward goals    Frequency    Min 3X/week      PT Plan      Co-evaluation              AM-PAC PT "6 Clicks" Daily Activity  Outcome Measure  Difficulty turning over in bed  (including adjusting bedclothes, sheets and blankets)?: None Difficulty moving from lying on back to sitting on the side of the bed? : None Difficulty sitting down on and standing up from a chair with arms (e.g., wheelchair, bedside commode, etc,.)?: Total Help needed moving to and from a bed to chair (including a wheelchair)?: A Little Help needed walking in hospital room?: A Little Help needed climbing 3-5 steps with a railing? : A Little 6 Click Score: 18    End of Session Equipment Utilized During Treatment: Gait belt Activity Tolerance: Patient tolerated treatment well Patient left: in bed;with call bell/phone within reach;with bed alarm set;with family/visitor present;with SCD's reapplied Nurse Communication: Mobility status PT Visit Diagnosis: Unsteadiness on feet (R26.81);Other abnormalities of gait and mobility (R26.89);Muscle weakness (generalized) (M62.81)     Time: 9892-1194 PT Time Calculation (min) (ACUTE ONLY): 19 min  Charges:  $Gait Training: 8-22 mins                    G Codes:       Wray Kearns, PT, DPT [phone]     [place] 01/16/2017, 3:45 PM

## 2017-01-16 NOTE — Care Management Important Message (Signed)
Important Message  Patient Details  Name: Nicole [name] MRN: [id number] Date of Birth: 05-29-1931   Medicare Important Message Given:  Yes    Nathen May 01/16/2017, 11:23 AM

## 2017-01-16 NOTE — Progress Notes (Signed)
PROGRESS NOTE    TINE MABEE  ZOX:[id number] DOB: [birth year] DOA: 01/11/2017 PCP: [name], DO    Brief Narrative:  83 y.o. female with complicated past medical history significant for history of intracranial hemorrhage, vascular dementia, chronic atrial fibrillation and PE on Eliquis, history of GI bleed, hypertension, chronic kidney disease stage II-III, and hypothyroidism, now presented to the emergency department at the direction of her PCP for evaluation of generalized weakness, lethargy, and respiratory distress.  Upon further evaluation patient was found to have multiple areas and brain with hemorrhaging. Which includes acute small right basal ganglia hemorrhage without mass effect left frontal lobe hematoma, old hemorrhagic right basal ganglion infarct. Case was discussed with neurosurgery who recommended stopping eliquis  Assessment & Plan:   Principal Problem: Basal ganglia hemorrhage: Secondary to amyloid angiopathy - Patient has multiple areas of hemorrhage. I have consulted neurology and they will assist with further management. Patient should never be anticoagulated given that she has a propensity for bleeding into her brain. - Tight blood pressure control was recommended by neurology. Will continue oral regimen and have PRN hydralazine for goal SBP of > 140. - Blood pressure medication was adjusted including increasing clonidine and beta blocker doses. Unfortunately patient developed bradycardia and pauses. As such I will discontinue clonidine and increase hydralazine dose and add amlodipine. As mentioned above patient has when necessary hydralazine  Bradycardia - Despite lowering clonidine dose patient continued to have bradycardia as such have decided to discontinue clonidine. We'll continue monitoring on telemetry    Generalized weakness - The patient has many serious active medical problems which make her prognosis poor. I discussed with family. Patient has  several intracerebral hemorrhages. Please refer to MRI for details. Palliative was obtained for goals of care and per my discussion with palliative care team family stated they would take patient home and care for her. - Physical therapy evaluation recommending home health PT  Active Problems:   Anemia - Resolved on repeat blood tests    hypokalemia - Replace orally and resolved on last check 3.7    Thoracic aortic aneurysm, 4.8cm 2011   HTN (hypertension) - Continue current antihypertensive regimen: Amlodipine, hydralazine, imdur, lisinopril, metoprolol - Goal is for systolic blood pressure 811 or less    Acute respiratory distress - resolved on supplemental oxygen    ICH (intracerebral hemorrhage) ([place]) - Was discussed with neurosurgeon and neurology - Eliquis discontinued    Chronic atrial fibrillation ([place]) - Not recommended to use anticoagulation given above - Continue rate control metoprolol    Type 2 diabetes mellitus with peripheral neuropathy ([place])   Vascular dementia    Hypothyroidism due to acquired atrophy of thyroid - Stable on Synthroid    Pulmonary embolus (H[facility]) - no longer anticoagulant candidate - Palliative care consulted for goals of care - Radiologist was consulted for consideration of IVC filter. After further evaluation patient is not candidate. Lower extremity duplex was obtained which was negative for DVTs. Discuss with neurology and plan is for discharge without IVC filter.   DVT prophylaxis: Place SCDs Code Status: DO NOT RESUSCITATE  Family Communication: discussed with daughter  Disposition Plan: Patient had bradycardia and pauses on clonidine. Will discontinue and try blood pressure control with other agents. One systolic blood pressure 914 or less we'll plan on discharging   Consultants:   Neurology  Palliative care   Procedures: None   Antimicrobials:None   Subjective: The patient denies any new problems.  Objective: Vitals:  01/16/17 0257 01/16/17 0600 01/16/17 0637 01/16/17 0822  BP: (!) 149/75 121/74 (!) 156/93 (!) 143/92  Pulse:   75 79  Resp:   20 20  Temp:   98 F (36.7 C) 98.2 F (36.8 C)  TempSrc:   Oral Oral  SpO2:   98% 100%  Weight:   67.2 kg (148 lb 3.2 oz)   Height:        Intake/Output Summary (Last 24 hours) at 01/16/17 1032 Last data filed at 01/16/17 0600  Gross per 24 hour  Intake               50 ml  Output              150 ml  Net             -100 ml   Filed Weights   01/14/17 0405 01/15/17 0448 01/16/17 0637  Weight: 66.3 kg (146 lb 3.2 oz) 67 kg (147 lb 11.2 oz) 67.2 kg (148 lb 3.2 oz)    Examination:  General exam: Alert and awake, in no acute distress Respiratory system: No increased work of breathing, equal chest rise, no wheezes Cardiovascular system: irregular rate and rhythm, no murmurs rubs  Telemetry: Upon review reporting pauses and bradycardia Gastrointestinal system: Abdomen is nondistended, soft and nontender. No organomegaly or masses felt. Normal bowel sounds heard. Central nervous system:  no facial asymmetry, Alert and Awake Extremities: warm, dry Skin: lesions, No rashes, or ulcers, on limited exam. Psychiatry:  Mood & affect appropriate.   Data Reviewed: I have personally reviewed following labs and imaging studies  CBC:  Recent Labs Lab 01/11/17 1405 01/11/17 2123 01/12/17 0700  WBC 5.8  --  7.2  NEUTROABS  --   --  4.7  HGB 11.6*  --  13.0  HCT 33.9* 34.2 39.7  MCV 68.9*  --  70.3*  PLT 234  --  158   Basic Metabolic Panel:  Recent Labs Lab 01/11/17 1405 01/12/17 0700 01/13/17 1236 01/14/17 0420 01/15/17 0433  NA 139 138 140 142 140  K 3.4* 3.4* 3.4* 3.4* 3.7  CL 104 103 107 107 107  CO2 26 23 27 28 27   GLUCOSE 127* 136* 212* 114* 123*  BUN 12 12 12 9 10   CREATININE 1.07* 1.07* 1.24* 1.23* 1.13*  CALCIUM 9.0 8.7* 8.8* 8.8* 8.7*  MG  --  1.6*  --   --   --    GFR: Estimated Creatinine Clearance: 38.1 mL/min (A) (by C-G  formula based on SCr of 1.13 mg/dL (H)). Liver Function Tests: No results for input(s): AST, ALT, ALKPHOS, BILITOT, PROT, ALBUMIN in the last 168 hours. No results for input(s): LIPASE, AMYLASE in the last 168 hours.  Recent Labs Lab 01/11/17 2123  AMMONIA 24   Coagulation Profile: No results for input(s): INR, PROTIME in the last 168 hours. Cardiac Enzymes:  Recent Labs Lab 01/11/17 2123 01/12/17 0055 01/12/17 0700  TROPONINI <0.03 <0.03 <0.03   BNP (last 3 results) No results for input(s): PROBNP in the last 8760 hours. HbA1C: No results for input(s): HGBA1C in the last 72 hours. CBG:  Recent Labs Lab 01/15/17 0745 01/15/17 1142 01/15/17 1626 01/15/17 2124 01/16/17 0818  GLUCAP 130* 148* 134* 117* 128*   Lipid Profile: No results for input(s): CHOL, HDL, LDLCALC, TRIG, CHOLHDL, LDLDIRECT in the last 72 hours. Thyroid Function Tests: No results for input(s): TSH, T4TOTAL, FREET4, T3FREE, THYROIDAB in the last 72 hours. Anemia Panel: No results for  input(s): VITAMINB12, FOLATE, FERRITIN, TIBC, IRON, RETICCTPCT in the last 72 hours. Sepsis Labs:  Recent Labs Lab 01/11/17 1505 01/11/17 1932  LATICACIDVEN 1.49 1.27    Recent Results (from the past 240 hour(s))  Urine culture     Status: Abnormal   Collection Time: 01/11/17  5:26 PM  Result Value Ref Range Status   Specimen Description URINE, RANDOM  Final   Special Requests NONE  Final   Culture (A)  Final    >=100,000 COLONIES/mL LACTOBACILLUS SPECIES Standardized susceptibility testing for this organism is not available.    Report Status 01/13/2017 FINAL  Final      Radiology Studies: No results found.  Scheduled Meds: . allopurinol  100 mg Oral Daily  . amLODipine  5 mg Oral Daily  . donepezil  10 mg Oral QHS  . hydrALAZINE  50 mg Oral Q6H  . isosorbide mononitrate  60 mg Oral Daily  . levothyroxine  50 mcg Oral QAC breakfast  . lisinopril  20 mg Oral Daily  . metoprolol tartrate  75 mg Oral  BID  . sodium chloride flush  3 mL Intravenous Q12H   Continuous Infusions:   LOS: 4 days   Time spent: > 35 minutes  Velvet Bathe, MD [facility] Pager [phone]  If 7PM-7AM, please contact night-coverage [email or web address] Password [facility] 01/16/2017, 10:32 AM

## 2017-01-17 DIAGNOSIS — I712 Thoracic aortic aneurysm, without rupture: Secondary | ICD-10-CM

## 2017-01-17 DIAGNOSIS — R0603 Acute respiratory distress: Secondary | ICD-10-CM

## 2017-01-17 DIAGNOSIS — E039 Hypothyroidism, unspecified: Secondary | ICD-10-CM

## 2017-01-17 LAB — GLUCOSE, CAPILLARY
GLUCOSE-CAPILLARY: 142 mg/dL — AB (ref 65–99)
GLUCOSE-CAPILLARY: 184 mg/dL — AB (ref 65–99)
GLUCOSE-CAPILLARY: 213 mg/dL — AB (ref 65–99)
Glucose-Capillary: 221 mg/dL — ABNORMAL HIGH (ref 65–99)

## 2017-01-17 MED ORDER — LABETALOL HCL 200 MG PO TABS
200.0000 mg | ORAL_TABLET | Freq: Two times a day (BID) | ORAL | Status: DC
  Administered 2017-01-17 – 2017-01-18 (×3): 200 mg via ORAL
  Filled 2017-01-17 (×3): qty 1

## 2017-01-17 MED ORDER — INSULIN ASPART 100 UNIT/ML ~~LOC~~ SOLN: 0.0000 [IU] | Freq: Every day | SUBCUTANEOUS | Status: DC

## 2017-01-17 MED ORDER — INSULIN ASPART 100 UNIT/ML ~~LOC~~ SOLN
0.0000 [IU] | Freq: Three times a day (TID) | SUBCUTANEOUS | Status: DC
  Administered 2017-01-18: 2 [IU] via SUBCUTANEOUS
  Administered 2017-01-18: 1 [IU] via SUBCUTANEOUS

## 2017-01-17 MED ORDER — LABETALOL HCL 5 MG/ML IV SOLN
10.0000 mg | INTRAVENOUS | Status: DC | PRN
  Administered 2017-01-17: 10 mg via INTRAVENOUS
  Filled 2017-01-17: qty 4

## 2017-01-17 MED ORDER — HYDRALAZINE HCL 20 MG/ML IJ SOLN: 10.0000 mg | Freq: Four times a day (QID) | INTRAMUSCULAR | Status: DC | PRN

## 2017-01-17 MED ORDER — HYDRALAZINE HCL 20 MG/ML IJ SOLN: 10.0000 mg | INTRAMUSCULAR | Status: DC | PRN

## 2017-01-17 MED ORDER — ISOSORB DINITRATE-HYDRALAZINE 20-37.5 MG PO TABS
2.0000 | ORAL_TABLET | Freq: Three times a day (TID) | ORAL | Status: DC
  Administered 2017-01-17 – 2017-01-18 (×4): 2 via ORAL
  Filled 2017-01-17 (×4): qty 2

## 2017-01-17 NOTE — Progress Notes (Signed)
PROGRESS NOTE    Nicole [name]  KKX:[id number] DOB: 12/30/1930 DOA: 01/11/2017 PCP: [name], DO    Brief Narrative:  83 y.o. female with complicated past medical history significant for history of intracranial hemorrhage, vascular dementia, chronic atrial fibrillation and PE on Eliquis, history of GI bleed, hypertension, chronic kidney disease stage II-III, and hypothyroidism, now presented to the emergency department at the direction of her PCP for evaluation of generalized weakness, lethargy, and respiratory distress.  Upon further evaluation patient was found to have multiple areas and brain with hemorrhaging. Which includes acute small right basal ganglia hemorrhage without mass effect left frontal lobe hematoma, old hemorrhagic right basal ganglion infarct. Case was discussed with neurosurgery who recommended stopping eliquis  Assessment & Plan:  Basal ganglia hemorrhage: Secondary to amyloid angiopathy - Patient has multiple areas of hemorrhage. I have consulted neurology and they has recommended no anticoagulation therapy. Once hematoma resolved, maybe use of low dose aspirin. Overall prognosis is poor. Neurology will follow with her as an outpatient. - Tight blood pressure control was recommended by neurology. Will continue oral regimen and have PRN hydralazine for goal SBP of > 140 in the first 24 hours; then looking to maintain SBP < 180. - Blood pressure medication has been adjusted and modified to achieve BP control -patient encourage to follow low sodium diet   Bradycardia - metoprolol discontinued -will monitor on telemetry -patient on labetalol now; mainly with intention for BP control    Generalized weakness - The patient has many serious active medical problems which make her prognosis poor. I discussed with family. Patient has several intracerebral hemorrhages. Please refer to MRI for details. -Palliative Care was obtained for goals of care and per Dr. Wendee Beavers  Discussion with palliative care team, plan is for to take patient home and care for her. - Physical therapy evaluation recommending home health PT, will arranged at discharge     hypokalemia - replete as needed, will monitor    Thoracic aortic aneurysm, 4.8cm 2011 -continue outpatient follow up    HTN (hypertension) -BP is still up and not at goal -also with multiple agents which would increase chances of not compliance -will start labetalol BID, bidil TID and lisinopril daily -if needed and/or HR gets out of control will add daily diltiazem.    Acute respiratory distress -resolved -most likely associated with hypertension and mild vascular congestion    ICH (intracerebral hemorrhage) (HCC) -case was discussed with neurology and neurosurgery -no further workup or intervention needed -continue holding anticoagulation    Chronic atrial fibrillation ([place]) -not longer a candidate for anticoagulation given ICH -per neurology rc's; once hematoma resolved maybe use of baby aspirin.    Type 2 diabetes mellitus with peripheral neuropathy (HCC) -CBG's > 200 -will use sensitive SSI while inpatient -will check A1C    Hypothyroidism due to acquired atrophy of thyroid -TSH WNL -will continue synthroid at current dose    Pulmonary embolus ([place]) -patient not longer an anticoagulation candidate due to [place] -per radiologist no need for IVC filter -LE duplex neg for DVT -will monitor and follow clinical response -no CP and no SOB   DVT prophylaxis: Place SCDs Code Status: DO NOT RESUSCITATE  Family Communication: no family at bedside  Disposition Plan: following neurology rec's will achieve SBP control < 180; monitor for bradycardia and pauses. Patient w/o major neurologic deficit.    Consultants:   Neurology  Neurosurgery   Palliative care   Procedures:  See below for x-ray  reports LE duplex: neg for DVT    Antimicrobials: None   Subjective: Patient in no acute  distress; denies CP and SOB. Feeling ok and denying palpitations or heart skipping beats sensation.  Objective: Vitals:   01/17/17 1448 01/17/17 1637 01/17/17 2000 01/17/17 2100  BP: (!) 155/98 (!) 165/87  (!) 163/94  Pulse:  91  (!) 106  Resp: 19  19 (!) 21  Temp:  98.2 F (36.8 C)    TempSrc:  Oral    SpO2:  98%  98%  Weight:      Height:        Intake/Output Summary (Last 24 hours) at 01/17/17 2218 Last data filed at 01/17/17 1637  Gross per 24 hour  Intake               50 ml  Output              200 ml  Net             -150 ml   Filed Weights   01/15/17 0448 01/16/17 0637 01/17/17 0617  Weight: 67 kg (147 lb 11.2 oz) 67.2 kg (148 lb 3.2 oz) 64.4 kg (141 lb 14.4 oz)    Examination:  General exam: AAOX3, in no distress; denies CP and SOB. Patient also endorses no nausea, no vomiting and is able to answer questions properly and follow commands. Respiratory system: good air movement, no wheezing, no crackles, no using accessory muscles  Cardiovascular system: rate controlled, positive a. Fib on telemetry, no rubs, no gallops Gastrointestinal system: soft, NT, ND, positive BS Central nervous system:  AAOX3, no facial droop, no drift, no dysarthria, CN intact  Extremities: no cyanosis, no edema, no clubbing   Data Reviewed: I have personally reviewed following labs and imaging studies  CBC:  Recent Labs Lab 01/11/17 1405 01/11/17 2123 01/12/17 0700  WBC 5.8  --  7.2  NEUTROABS  --   --  4.7  HGB 11.6*  --  13.0  HCT 33.9* 34.2 39.7  MCV 68.9*  --  70.3*  PLT 234  --  032   Basic Metabolic Panel:  Recent Labs Lab 01/11/17 1405 01/12/17 0700 01/13/17 1236 01/14/17 0420 01/15/17 0433  NA 139 138 140 142 140  K 3.4* 3.4* 3.4* 3.4* 3.7  CL 104 103 107 107 107  CO2 26 23 27 28 27   GLUCOSE 127* 136* 212* 114* 123*  BUN 12 12 12 9 10   CREATININE 1.07* 1.07* 1.24* 1.23* 1.13*  CALCIUM 9.0 8.7* 8.8* 8.8* 8.7*  MG  --  1.6*  --   --   --    GFR: Estimated  Creatinine Clearance: 38.1 mL/min (A) (by C-G formula based on SCr of 1.13 mg/dL (H)).  Recent Labs Lab 01/11/17 2123  AMMONIA 24   Cardiac Enzymes:  Recent Labs Lab 01/11/17 2123 01/12/17 0055 01/12/17 0700  TROPONINI <0.03 <0.03 <0.03   CBG:  Recent Labs Lab 01/16/17 2146 01/17/17 0720 01/17/17 1107 01/17/17 1627 01/17/17 2115  GLUCAP 123* 142* 184* 221* 213*   Sepsis Labs:  Recent Labs Lab 01/11/17 1505 01/11/17 1932  LATICACIDVEN 1.49 1.27    Recent Results (from the past 240 hour(s))  Urine culture     Status: Abnormal   Collection Time: 01/11/17  5:26 PM  Result Value Ref Range Status   Specimen Description URINE, RANDOM  Final   Special Requests NONE  Final   Culture (A)  Final    >=100,000 COLONIES/mL  LACTOBACILLUS SPECIES Standardized susceptibility testing for this organism is not available.    Report Status 01/13/2017 FINAL  Final      Radiology Studies: No results found.  Scheduled Meds: . allopurinol  100 mg Oral Daily  . donepezil  10 mg Oral QHS  . isosorbide-hydrALAZINE  2 tablet Oral TID  . labetalol  200 mg Oral BID  . levothyroxine  50 mcg Oral QAC breakfast  . lisinopril  20 mg Oral Daily  . sodium chloride flush  3 mL Intravenous Q12H   Continuous Infusions:   LOS: 5 days   Time spent: 25 minutes  Barton Dubois, MD [facility] Pager [phone]  If 7PM-7AM, please contact night-coverage [email or web address] Password [facility] 01/17/2017, 10:18 PM

## 2017-01-17 NOTE — Progress Notes (Signed)
CCMD notified RN that patient had a 2.04 sec pause. RN checked on patient. Patient denied any symptoms. Will continue to monitor.

## 2017-01-17 NOTE — Progress Notes (Signed)
Physical Therapy Treatment Patient Details Name: Nicole [name] MRN: [id number] DOB: 1931/02/12 Today's Date: 01/17/2017    History of Present Illness Nicole [name] is a 83 y.o. female with complicated past medical history significant for history of intracranial hemorrhage, Lt-sided weakness, vascular dementia, chronic atrial fibrillation and PE on Eliquis, history of GI bleed, hypertension, chronic kidney disease stage II-III, and hypothyroidism.  Patient is admitted 01/11/17 for evaluation of generalized weakness, lethargy, and respiratory distress.   Patient also with HTN.  MRI showed multiple areas of hemorrhage.    PT Comments    Patient progressing slowly. More fatigued during mobility today and 2/4 DOE. Able to increase ambulation distance but stairs deferred due to fatigue. LLE weakness more notable today as well with some instability but no buckling. Better able to navigate RW. HR ranged from 90-120 bpm. Will need to practice stairs prior to return home. Will continue to follow.  Follow Up Recommendations  Home health PT;Supervision/Assistance - 24 hour     Equipment Recommendations  None recommended by PT    Recommendations for Other Services       Precautions / Restrictions Precautions Precautions: Fall Precaution Comments: Lt-sided weakness Restrictions Weight Bearing Restrictions: No    Mobility  Bed Mobility Overal bed mobility: Needs Assistance Bed Mobility: Supine to Sit     Supine to sit: Min assist;Min guard;HOB elevated Sit to supine: Supervision   General bed mobility comments: "help me" when trying to get to EOB first time requiring Min A for trunk; performed again without assist but increased time and cues to use rail.   Transfers Overall transfer level: Needs assistance Equipment used: Rolling walker (2 wheeled) Transfers: Sit to/from Stand Sit to Stand: Min assist         General transfer comment: Cues for hand placement to and from seated  surface as pt wanting to pull up on RW. Stood from [employer].   Ambulation/Gait Ambulation/Gait assistance: Min guard Ambulation Distance (Feet): 140 Feet Assistive device: Rolling walker (2 wheeled) Gait Pattern/deviations: Decreased stride length;Shuffle;Step-through pattern;Trunk flexed;Decreased stance time - left Gait velocity: decreased   General Gait Details: Slow, mostly steady gait with left knee instabilty noted today but no buckling. 2/4 DOE. Fatigues quickly today so stairs deferred. HR 90-120 bpm.    Stairs            Wheelchair Mobility    Modified Rankin (Stroke Patients Only) Modified Rankin (Stroke Patients Only) Pre-Morbid Rankin Score: Moderate disability Modified Rankin: Moderately severe disability     Balance Overall balance assessment: Needs assistance Sitting-balance support: No upper extremity supported;Feet supported Sitting balance-Leahy Scale: Good     Standing balance support: During functional activity Standing balance-Leahy Scale: Fair Standing balance comment: Able to assist pulling up pampers without UE support and perform pericare in standing.                             Cognition Arousal/Alertness: Awake/alert Behavior During Therapy: WFL for tasks assessed/performed Overall Cognitive Status: History of cognitive impairments - at baseline                                        Exercises      General Comments General comments (skin integrity, edema, etc.): Son present but left during session.      Pertinent Vitals/Pain Pain Assessment: No/denies pain  Home Living                      Prior Function            PT Goals (current goals can now be found in the care plan section) Progress towards PT goals: Progressing toward goals    Frequency    Min 3X/week      PT Plan Current plan remains appropriate    Co-evaluation              AM-PAC PT "6 Clicks" Daily Activity   Outcome Measure  Difficulty turning over in bed (including adjusting bedclothes, sheets and blankets)?: None Difficulty moving from lying on back to sitting on the side of the bed? : Total Difficulty sitting down on and standing up from a chair with arms (e.g., wheelchair, bedside commode, etc,.)?: Total Help needed moving to and from a bed to chair (including a wheelchair)?: A Little Help needed walking in hospital room?: A Little Help needed climbing 3-5 steps with a railing? : A Little 6 Click Score: 15    End of Session Equipment Utilized During Treatment: Gait belt Activity Tolerance: Patient limited by fatigue Patient left: in bed;with call bell/phone within reach;with bed alarm set Nurse Communication: Mobility status PT Visit Diagnosis: Unsteadiness on feet (R26.81);Other abnormalities of gait and mobility (R26.89);Muscle weakness (generalized) (M62.81)     Time: 1103-1594 PT Time Calculation (min) (ACUTE ONLY): 17 min  Charges:  $Gait Training: 8-22 mins                    G Codes:       Wray Kearns, PT, DPT [phone]     Marguarite Arbour A [place] 01/17/2017, 9:26 AM

## 2017-01-17 NOTE — Progress Notes (Signed)
Family members and minister at bedside, patient notified by family members of her son's death.  Comfort offered, comfort cart ordered.

## 2017-01-17 NOTE — Progress Notes (Signed)
Patient's son has died today.  Patient does not yet know,  Paged MD to let him know

## 2017-01-18 DIAGNOSIS — R0602 Shortness of breath: Secondary | ICD-10-CM

## 2017-01-18 LAB — GLUCOSE, CAPILLARY
GLUCOSE-CAPILLARY: 174 mg/dL — AB (ref 65–99)
Glucose-Capillary: 139 mg/dL — ABNORMAL HIGH (ref 65–99)
Glucose-Capillary: 199 mg/dL — ABNORMAL HIGH (ref 65–99)

## 2017-01-18 MED ORDER — DILTIAZEM HCL ER COATED BEADS 120 MG PO CP24: 120.0000 mg | ORAL_CAPSULE | Freq: Every day | ORAL | 1 refills | Status: DC

## 2017-01-18 MED ORDER — ISOSORB DINITRATE-HYDRALAZINE 20-37.5 MG PO TABS: 2.0000 | ORAL_TABLET | Freq: Three times a day (TID) | ORAL | 1 refills | Status: DC

## 2017-01-18 MED ORDER — LISINOPRIL 20 MG PO TABS: 20.0000 mg | ORAL_TABLET | Freq: Every day | ORAL | 1 refills | Status: DC

## 2017-01-18 MED ORDER — DILTIAZEM HCL ER COATED BEADS 120 MG PO CP24
120.0000 mg | ORAL_CAPSULE | Freq: Every day | ORAL | Status: DC
  Administered 2017-01-18: 120 mg via ORAL
  Filled 2017-01-18: qty 1

## 2017-01-18 MED ORDER — LABETALOL HCL 200 MG PO TABS: 200.0000 mg | ORAL_TABLET | Freq: Two times a day (BID) | ORAL | 1 refills | Status: DC

## 2017-01-18 NOTE — Progress Notes (Signed)
Discharge instructions reviewed with Pt and caregiver.Prescriptions given. Pt and caregiver have no questions at this time. Follow up appointments made. IV d/c.

## 2017-01-18 NOTE — Discharge Summary (Signed)
Physician Discharge Summary  Nicole [name] TRV:[id number] DOB: 12-19-1930 DOA: 01/11/2017  PCP: [name], DO  Admit date: 01/11/2017 Discharge date: 01/18/2017  Time spent: 35 minutes  Recommendations for Outpatient Follow-up:  Repeat BMET to follow electrolytes and renal function Reassess BP and adjust antihypertensive regimen as needed  Further discussion regarding GOC and advance directives to be sustained, base on patient evolution  Discharge Diagnoses:  Principal Problem:   Generalized weakness Active Problems:   Anemia   Thoracic aortic aneurysm, 4.8cm 2011   HTN (hypertension)   Acute respiratory distress   Hypokalemia   SOB (shortness of breath)   ICH (intracerebral hemorrhage) (HCC)   Chronic atrial fibrillation ([place])   Hypertensive urgency   Type 2 diabetes mellitus with peripheral neuropathy (HCC)   Vascular dementia   Chronic anemia   Hypothyroidism due to acquired atrophy of thyroid   Pulmonary embolus ([place])   Bradyarrhythmia   General weakness   Palliative care by specialist   DNR (do not resuscitate)   Discharge Condition: stable and improved. Patient discharge home with [facility] services for [place], [place] and [place]. Patient instructed to follow up with PCP in 10 days and with neurology service in 2 months or so (neurology service will arrange for follow up visit).  Diet recommendation: heart healthy diet   Filed Weights   01/16/17 0637 01/17/17 0617 01/18/17 0453  Weight: 67.2 kg (148 lb 3.2 oz) 64.4 kg (141 lb 14.4 oz) 64.1 kg (141 lb 4.8 oz)    Brief History of present illness:  83 y.o.femalewith complicated past medical history significant for history of intracranial hemorrhage, vascular dementia, chronic atrial fibrillation and PE on Eliquis, history of GI bleed, hypertension, chronic kidney disease stage II-III, and hypothyroidism, now presented to the emergency department at the direction of her PCP for evaluation of generalized weakness, lethargy,  and respiratory distress.  Upon further evaluation patient was found to have multiple areas and brain with hemorrhaging. Which includes acute small right basal ganglia hemorrhage without mass effect left frontal lobe hematoma, old hemorrhagic right basal ganglion infarct.  Hospital Course:  Basal ganglia hemorrhage: Secondary to amyloid angiopathy - Patient has multiple areas of hemorrhage. I have consulted neurology and they has recommended no anticoagulation therapy. Once hematoma resolved, maybe use of low dose aspirin. Overall prognosis is poor. Neurology will follow with her as an outpatient. -will benefit of further discussion for GOC and advance directives  - Tight blood pressure control was recommended by neurology; for goal SBP of > 140 in the first 24 hours; then looking to maintain SBP < 180. - Blood pressure medication has been adjusted and modified to achieved BP control; see below -patient encourage to follow low sodium diet   Bradycardia -metoprolol discontinued -HR controlled and stable on telemetry for 48 hours prior to discharge -patient on labetalol now; mainly with intention for BP control -also on cardizem, which would control HR and help with BP control as well    Generalized weakness - The patient has many serious active medical problems which make her prognosis poor. I discussed with family. Patient has several intracerebral hemorrhages. Please refer to MRI for details. -Palliative Care was obtained for goals of care and per Dr. Wendee Beavers Discussion with palliative care team, plan is for to take patient home and care for her. - Physical therapy evaluation recommending home health PT/OT, will arranged at discharge     hypokalemia - repleted -will recommend BMET at follow up to assess electrolytes trend  Thoracic aortic aneurysm, 4.8cm 2011 -continue outpatient follow up    HTN (hypertension) -BP improved and now <696 systolic  -patient discharged on simplified  regimen  -started on labetalol BID, bidil TID and lisinopril daily -for better control of her HR, norvasc discontinued and patient started on cardizem    Acute respiratory distress -resolved -most likely associated with hypertension and mild vascular congestion -continue BP control -continue every other day Lasix and low sodium diet     ICH (intracerebral hemorrhage) (HCC) -case was discussed with neurology and neurosurgery -no further workup or intervention needed -continue holding anticoagulation    Chronic atrial fibrillation (HCC) -not longer a candidate for anticoagulation given ICH/frontal hematoma  -per neurology rc's; once hematoma resolved maybe use of baby aspirin. -neurology service will follow patient after discharge and reassess      Type 2 diabetes mellitus with peripheral neuropathy (HCC) -CBG's > 200 in 2 occasions during hospitalization -SSI used while inpatient -A1C 6.8 -resume diet control and follow up with PCP to start hypoglycemic regimen when appropriate    Hypothyroidism due to acquired atrophy of thyroid -TSH WNL -will continue synthroid at current dose    Pulmonary embolus ([place]) -patient not longer an anticoagulation candidate due to [place] and hematoma  -per radiologist no need for IVC filter -LE duplex neg for DVT -no CP and no SOB  Procedures:  See below for x-ray reports  LE duplex: neg for DVT   Consultations:  Neurology  Neurosurgery   Palliative care  Discharge Exam: Vitals:   01/18/17 0453 01/18/17 0822  BP: (!) 148/111 (!) 172/116  Pulse: 91   Resp: 15 (!) 24  Temp: 98 F (36.7 C)    General exam: AAOX3, in no distress; denies CP and SOB. Patient also endorses no nausea, no vomiting and is able to answer questions properly and follow commands. Respiratory system: good air movement, no wheezing, no crackles, no using accessory muscles  Cardiovascular system: rate controlled, positive a. Fib on telemetry, no rubs, no  gallops Gastrointestinal system: soft, NT, ND, positive BS Central nervous system:  AAOX3, no facial droop, no drift, no dysarthria, CN intact  Extremities: no cyanosis, no edema, no clubbing   Discharge Instructions   Discharge Instructions    Diet - low sodium heart healthy    Complete by:  As directed      Current Discharge Medication List    START taking these medications   Details  diltiazem (CARDIZEM CD) 120 MG 24 hr capsule Take 1 capsule (120 mg total) by mouth daily. Qty: 30 capsule, Refills: 1    isosorbide-hydrALAZINE (BIDIL) 20-37.5 MG tablet Take 2 tablets by mouth 3 (three) times daily. Qty: 90 tablet, Refills: 1    labetalol (NORMODYNE) 200 MG tablet Take 1 tablet (200 mg total) by mouth 2 (two) times daily. Qty: 60 tablet, Refills: 1      CONTINUE these medications which have CHANGED   Details  lisinopril (PRINIVIL,ZESTRIL) 20 MG tablet Take 1 tablet (20 mg total) by mouth daily. Qty: 30 tablet, Refills: 1      CONTINUE these medications which have NOT CHANGED   Details  allopurinol (ZYLOPRIM) 100 MG tablet TAKE 1 TABLET BY MOUTH  DAILY Qty: 90 tablet, Refills: 1    donepezil (ARICEPT) 10 MG tablet TAKE 1 TABLET BY MOUTH AT  BEDTIME Qty: 90 tablet, Refills: 0   Associated Diagnoses: Vascular dementia without behavioral disturbance    furosemide (LASIX) 20 MG tablet Take 1 tablet (20 mg)  by mouth every other day, may also take one additional tablet daily for weight gain greater than 3 lbs. Qty: 90 tablet, Refills: 3    levothyroxine (SYNTHROID, LEVOTHROID) 50 MCG tablet TAKE 1 TABLET BY MOUTH  DAILY BEFORE BREAKFAST Qty: 90 tablet, Refills: 0    potassium chloride (K-DUR) 10 MEQ tablet Take 1 tablet (10 mEq total) by mouth every other day. Qty: 45 tablet, Refills: 3      STOP taking these medications     apixaban (ELIQUIS) 5 MG TABS tablet      cloNIDine (CATAPRES) 0.2 MG tablet      hydrALAZINE (APRESOLINE) 50 MG tablet      isosorbide  mononitrate (IMDUR) 30 MG 24 hr tablet      metoprolol (LOPRESSOR) 100 MG tablet        Allergies  Allergen Reactions  . Iohexol Nausea And Vomiting  . Iodinated Diagnostic Agents Nausea And Vomiting  . Z-Pak [Azithromycin]     Reaction unknown by family   Follow-up Information    [facility] Follow up.   Why:  For home health. They will contact you in 1-2 days to set up your first home appointment.  Contact information: [address] [place] [address] [phone]        Hollace Kinnier L, DO. Schedule an appointment as soon as possible for a visit in 2 week(s).   Specialty:  Geriatric Medicine Contact information: [place]. [place] [address] [phone]            The results of significant diagnostics from this hospitalization (including imaging, microbiology, ancillary and laboratory) are listed below for reference.    Significant Diagnostic Studies: Ct Head Wo Contrast  Addendum Date: 01/12/2017   ADDENDUM REPORT: 01/12/2017 00:00 ADDENDUM: Critical Value/emergent results were called by telephone at the time of interpretation on 01/12/2017 at 12:00 am to NP Walden Field , who verbally acknowledged these results. Electronically Signed   By: Elon Alas M.D.   On: 01/12/2017 00:00   Result Date: 01/12/2017 CLINICAL DATA:  Weakness. History of colon cancer, intracranial hemorrhage, atrial fibrillation, diabetes, hypertension. EXAM: CT HEAD WITHOUT CONTRAST TECHNIQUE: Contiguous axial images were obtained from the base of the skull through the vertex without intravenous contrast. COMPARISON:  CT HEAD July 20, 2016 FINDINGS: BRAIN: 12 x 13 x 14 mm (volume = 1100 mm^3) acute RIGHT basal ganglia hemorrhage, minimal surrounding vasogenic edema. No significant mass effect. Faintly dense 12 x 13 mm LEFT frontal cortical to subcortical lesion, new from prior CT. Ventricles and sulci are normal for patient's age. Confluent supratentorial  white matter hypodensities. Old basal ganglia and thalamus lacunar infarcts. No acute large vascular territory infarct. No abnormal extra-axial fluid collections. VASCULAR: Moderate calcific atherosclerosis of the carotid siphons. SKULL: No skull fracture. No significant scalp soft tissue swelling. SINUSES/ORBITS: Chronic sphenoid sinusitis, hypoplastic RIGHT sphenoid. Mastoid air cells are well aerated.The included ocular globes and orbital contents are non-suspicious. OTHER: None. IMPRESSION: Acute 12 x 13 x 14 mm RIGHT basal ganglia hemorrhage without significant mass effect. 12 x 13 mm faintly dense LEFT frontal lobe lesion could reflect subacute hemorrhage or, metastasis. MRI of the brain with contrast is recommended. Moderate to severe chronic small vessel ischemic disease and old lacunar infarcts. Electronically Signed: By: Elon Alas M.D. On: 01/11/2017 23:53   Ct Angio Chest Pe W/cm &/or Wo Cm  Result Date: 01/11/2017 CLINICAL DATA:  Chest pain, shortness of breath. EXAM: CT ANGIOGRAPHY  CHEST WITH CONTRAST TECHNIQUE: Multidetector CT imaging of the chest was performed using the standard protocol during bolus administration of intravenous contrast. Multiplanar CT image reconstructions and MIPs were obtained to evaluate the vascular anatomy. CONTRAST:  100 mL of Isovue 370 intravenously. COMPARISON:  CT scan of August 23, 2016. FINDINGS: Cardiovascular: 4.8 cm ascending thoracic aortic aneurysm is noted. Atherosclerosis of thoracic aorta is noted. Filling defect is again noted in lower lobe branch of right pulmonary artery, and it is uncertain if this represents chronic or recurrent acute thrombus. Mild cardiomegaly is noted. No pericardial effusion is noted. Stable enlargement of pulmonary artery is noted suggesting pulmonary artery hypertension. Mediastinum/Nodes: No enlarged mediastinal, hilar, or axillary lymph nodes. Thyroid gland, trachea, and esophagus demonstrate no significant findings.  Lungs/Pleura: No pneumothorax is noted. Moderate right pleural effusion is noted with adjacent subsegmental atelectasis. Left lung is clear. Upper Abdomen: Left lung is clear. Musculoskeletal: No chest wall abnormality. No acute or significant osseous findings. Review of the MIP images confirms the above findings. IMPRESSION: Filling defect is again noted in lower lobe branch of right pulmonary artery ; it is uncertain if this represents chronic or recurrent acute pulmonary embolus. Critical Value/emergent results were called by telephone at the time of interpretation on 01/11/2017 at 5:13 pm to Dr. Clayton Bibles , who verbally acknowledged these results. Stable enlargement of pulmonary artery suggesting pulmonary artery hypertension. Moderate right pleural effusion is noted with adjacent subsegmental atelectasis. 4.8 cm ascending thoracic aortic aneurysm is noted. Ascending thoracic aortic aneurysm. Recommend semi-annual imaging followup by CTA or MRA and referral to cardiothoracic surgery if not already obtained. This recommendation follows 2010 ACCF/AHA/AATS/ACR/ASA/SCA/SCAI/SIR/STS/SVM Guidelines for the Diagnosis and Management of Patients With Thoracic Aortic Disease. Circulation. 2010; 121: W737-T062. Aortic Atherosclerosis (ICD10-I70.0). Electronically Signed   By: Marijo Conception, M.D.   On: 01/11/2017 17:13   Mr Jeri Cos IR Contrast  Result Date: 01/12/2017 CLINICAL DATA:  Followup hemorrhage. History of colon cancer, hypertension, atrial fibrillation, dementia. EXAM: MRI HEAD WITHOUT AND WITH CONTRAST TECHNIQUE: Multiplanar, multiecho pulse sequences of the brain and surrounding structures were obtained without and with intravenous contrast. CONTRAST:  34mL MULTIHANCE GADOBENATE DIMEGLUMINE 529 MG/ML IV SOLN COMPARISON:  None. FINDINGS: Moderately motion degraded examination, fast sequences utilized. INTRACRANIAL CONTENTS: Susceptibility artifact RIGHT basal ganglia corresponding to known hemorrhage with  favoring of T2 bright vasogenic edema. Numerous central and peripheral foci of susceptibility artifact. Confluent susceptibility artifact RIGHT basal ganglia, LEFT thalamus and to lesser extent LEFT basal ganglia. 14 x 10 mm LEFT frontal lobe lesion with intrinsic T1 shortening, minimal enhancement. Ventricles and sulci are overall normal for patient's age, mild ex vacuo dilatation RIGHT lateral ventricle associated with RIGHT basal ganglia infarct. Patchy to confluent supratentorial white matter T2 hyperintensities. Old tiny LEFT cerebellar infarct. No abnormal extra-axial fluid collections or abnormal extra-axial enhancement. No midline shift, mass effect or suspicious enhancement . VASCULAR: Normal major intracranial vascular flow voids present at skull base. SKULL AND UPPER CERVICAL SPINE: No abnormal sellar expansion. No suspicious calvarial bone marrow signal. Craniocervical junction maintained. SINUSES/ORBITS: The mastoid air-cells and included paranasal sinuses are well-aerated.The included ocular globes and orbital contents are non-suspicious. Status post bilateral ocular lens implants. OTHER: None. IMPRESSION: Moderately motion degraded examination. Acute small RIGHT basal ganglia hemorrhage without mass effect. Subacute LEFT frontal lobe 14 x 10 mm hematoma. Extensive susceptibility artifact compatible with chronic hypertension though, there may be compartment amyloid angiopathy. 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 Moderate to severe chronic small vessel  ischemic disease. Electronically Signed   By: Elon Alas M.D.   On: 01/12/2017 06:22   Dg Chest Portable 1 View  Result Date: 01/11/2017 CLINICAL DATA:  Chest pain and dyspnea EXAM: PORTABLE CHEST 1 VIEW COMPARISON:  08/22/2016 chest radiograph. FINDINGS: Stable cardiomediastinal silhouette with mild to moderate cardiomegaly and aortic atherosclerosis. No pneumothorax. No pleural effusion. No overt pulmonary edema. Mild curvilinear  bibasilar opacities. IMPRESSION: 1. Stable cardiomegaly without overt pulmonary edema . 2. Hazy curvilinear bibasilar lung opacities, favor scarring or atelectasis. Electronically Signed   By: Ilona Sorrel M.D.   On: 01/11/2017 14:42    Microbiology: Recent Results (from the past 240 hour(s))  Urine culture     Status: Abnormal   Collection Time: 01/11/17  5:26 PM  Result Value Ref Range Status   Specimen Description URINE, RANDOM  Final   Special Requests NONE  Final   Culture (A)  Final    >=100,000 COLONIES/mL LACTOBACILLUS SPECIES Standardized susceptibility testing for this organism is not available.    Report Status 01/13/2017 FINAL  Final     Labs: Basic Metabolic Panel:  Recent Labs Lab 01/11/17 1405 01/12/17 0700 01/13/17 1236 01/14/17 0420 01/15/17 0433  NA 139 138 140 142 140  K 3.4* 3.4* 3.4* 3.4* 3.7  CL 104 103 107 107 107  CO2 26 23 27 28 27   GLUCOSE 127* 136* 212* 114* 123*  BUN 12 12 12 9 10   CREATININE 1.07* 1.07* 1.24* 1.23* 1.13*  CALCIUM 9.0 8.7* 8.8* 8.8* 8.7*  MG  --  1.6*  --   --   --    Liver Function Tests: No results for input(s): AST, ALT, ALKPHOS, BILITOT, PROT, ALBUMIN in the last 168 hours. No results for input(s): LIPASE, AMYLASE in the last 168 hours.  Recent Labs Lab 01/11/17 2123  AMMONIA 24   CBC:  Recent Labs Lab 01/11/17 1405 01/11/17 2123 01/12/17 0700  WBC 5.8  --  7.2  NEUTROABS  --   --  4.7  HGB 11.6*  --  13.0  HCT 33.9* 34.2 39.7  MCV 68.9*  --  70.3*  PLT 234  --  296   Cardiac Enzymes:  Recent Labs Lab 01/11/17 2123 01/12/17 0055 01/12/17 0700  TROPONINI <0.03 <0.03 <0.03   BNP: BNP (last 3 results)  Recent Labs  04/09/16 1640 05/01/16 1337 08/22/16 2256  BNP 1,588.5* 383.1* 490.8*    ProBNP (last 3 results) No results for input(s): PROBNP in the last 8760 hours.  CBG:  Recent Labs Lab 01/17/17 1627 01/17/17 2115 01/18/17 0020 01/18/17 0750 01/18/17 1116  GLUCAP 221* 213* 174*  139* 199*       Signed:  Barton Dubois MD.  [facility] 01/18/2017, 11:23 AM

## 2017-01-19 LAB — HEMOGLOBIN A1C
HEMOGLOBIN A1C: 6.8 % — AB (ref 4.8–5.6)
MEAN PLASMA GLUCOSE: 148 mg/dL

## 2017-01-21 ENCOUNTER — Telehealth: Payer: Self-pay

## 2017-01-21 NOTE — Telephone Encounter (Signed)
Transition Care Management Follow-up Telephone Call  Date discharged? 01/11/2017  How have you been since you were released from the hospital? "Doing well today"  Do you understand why you were in the hospital? Yes   Do you understand the discharge instructions? Yes  Where were you discharged to? Home, Home Health Care    Items Reviewed: Medications reviewed: YES Allergies reviewed: YES Dietary changes reviewed: YES Referrals reviewed: YES   Functional Questionnaire:  Activities of Daily Living (ADLs):   states they are independent in the following: dressing, feeding, medications States they require assistance with the following: bathing, transportation  Any transportation issues/concerns?:No, might need transportation for apt tomorrow, she will check and call back if she needs assistance   Any patient concerns? No  Confirmed importance and date/time of follow-up visits scheduled :Yes Provider Appointment booked with Sherrie Mustache on 01/22/2017 at 10:15am at [facility]  Confirmed with patient if condition begins to worsen call PCP or go to the ER.  Patient was given the office number and encouraged to call back with question or concerns.  : YES

## 2017-01-22 ENCOUNTER — Ambulatory Visit: Payer: Self-pay | Admitting: Internal Medicine

## 2017-01-22 ENCOUNTER — Other Ambulatory Visit: Payer: Self-pay

## 2017-01-22 ENCOUNTER — Encounter: Payer: Medicare Other | Admitting: Nurse Practitioner

## 2017-01-22 NOTE — Patient Outreach (Signed)
[facility]) Care Management  01/22/2017  Nicole [name] Feb 20, 1931 [id number]      EMMI-STROKE RED ON EMMI ALERT Day # 3 Date: 01/21/17 Red Alert Reason: " Feeling worse overall? Yes"   Outreach attempt # 1 to patient. Spoke with dtr/HCPOA-Gloria. Addressed and reviewed red alert. She reports that patient is currently going through a lot right now. She states that patient's son passed away on November 14, 2022 unexpectedly. Patient was just given the news a few days ago and has been having a hard time dealing with it. Condolences given. Dtr reports that the funeral is Friday and they are unsure if patient will be able to attend. She voices that yesterday was a bad day for patient but she is doing somewhat better today. Dtr unsure if [facility] services has contacted patient. She reports an influx of phone calls lately and patient has been answering a lot of calls and not telling other family who has called. Provided dtr with [facility] agency contact info per d/c summary and advised to check with patient and if [facility] agency has not contacted her then to call agency. She voiced understanding. Dtr states that patient has not been left alone since returning home. She has a "friend"(Herman) who stays with her as well as a paid caregiver. Dtr voices that they have been busy and unable to make MD f/u appts but will do so in the near future. Advised dtr that patient  would continue to get automated EMMI-Stroke post discharge calls to assess how they are doing following recent hospitalization and will receive a call from a nurse if any of their responses were abnormal. Dtr voiced understanding and was appreciative of f/u call.        Plan: RN CM will notify [facility] administrative assistant of case status.    Enzo Montgomery, RN,BSN,CCM [place] Management Telephonic Care Management Coordinator Direct Phone: [phone] Toll Free: [phone] Fax: [phone]

## 2017-01-24 ENCOUNTER — Encounter: Payer: Self-pay | Admitting: Internal Medicine

## 2017-01-24 ENCOUNTER — Ambulatory Visit (INDEPENDENT_AMBULATORY_CARE_PROVIDER_SITE_OTHER): Payer: Medicare Other | Admitting: Internal Medicine

## 2017-01-24 ENCOUNTER — Telehealth: Payer: Self-pay

## 2017-01-24 VITALS — BP 157/76 | HR 95 | Wt 153.8 lb

## 2017-01-24 DIAGNOSIS — I610 Nontraumatic intracerebral hemorrhage in hemisphere, subcortical: Secondary | ICD-10-CM | POA: Diagnosis not present

## 2017-01-24 DIAGNOSIS — I10 Essential (primary) hypertension: Secondary | ICD-10-CM | POA: Diagnosis not present

## 2017-01-24 DIAGNOSIS — F0150 Vascular dementia without behavioral disturbance: Secondary | ICD-10-CM

## 2017-01-24 DIAGNOSIS — I482 Chronic atrial fibrillation: Secondary | ICD-10-CM

## 2017-01-24 DIAGNOSIS — I5032 Chronic diastolic (congestive) heart failure: Secondary | ICD-10-CM

## 2017-01-24 DIAGNOSIS — I712 Thoracic aortic aneurysm, without rupture, unspecified: Secondary | ICD-10-CM

## 2017-01-24 DIAGNOSIS — I4821 Permanent atrial fibrillation: Secondary | ICD-10-CM

## 2017-01-24 NOTE — Patient Instructions (Addendum)
Your physician wants you to follow-up in: 6 months with Dr. Hilty. You will receive a reminder letter in the mail two months in advance. If you don't receive a letter, please call our office to schedule the follow-up appointment.    

## 2017-01-24 NOTE — Progress Notes (Signed)
OFFICE NOTE  Chief Complaint:  Nervous  Primary Care Physician: [name], DO  HPI:  Nicole [name] is a 83 year old female with history of chronic atrial fibrillation and controlled ventricular response. She was previously followed by Dr. Rex Kras, has a history of congestive heart failure with an EF of about 45% and moderate MR in the past. She has a thoracic aneurysm which has been asymptomatic, as well as hypertension, diabetes, dyslipidemia, and recently was admitted for gallbladder surgery which was complicated by bleeding issues. She has been deemed not a good candidate for Coumadin due to a number of issues with compliance by Dr. Rex Kras, and has been on aspirin and Plavix. Unfortunately, with regard to the Plavix she had complicated bleeding postoperatively from her gallbladder surgery and Plavix was discontinued due to this. It turns out that there is little benefit to it additional Plavix on top of aspirin for stroke reduction. As far as her stroke risk is concerned, it is actually fairly high considering she is in chronic atrial fibrillation and does have a history of hypertension and diabetes and is a female with a CHADS2 score of 3, a CHADS VASc score of 3-1/2 or 4.  Based on this, I recommended starting her on Eliquis.   Nicole [name] returns today for followup. Unfortunately she was just in the hospital with epistaxis. This is following a period of time where she developed small bowel obstruction and had a nasogastric tube in place. This could contribute to possible erosions. She was seen by an ear nose and throat specialist to placed a nasal balloon and ultimately packed her nose. This packing was in for a week and was removed and no further bleeding was noted. It was not mentioned what the cause of her bleeding was. She was on adequate. She still has a high CHADSVASC score. We had recommended discontinuing her eloquence due to bleeding. She was also in A. fib with RVR and I  recommended increasing her diltiazem to 240 mg daily but that was not performed. She's had no further epistaxis.  I saw Nicole [name] back today in the office. Overall she reports feeling very well. Unfortunate she's recently been having significant falls. She says she's been falling out of bed amongst other things and eventually developed significant bruising and has a large resolving hematoma on her right chin. Fortunately there was no intracranial bleeding. She is maintained on warfarin but is not had a check of her INR since March. Her last check in our office was in December. It's unclear why she felt that she did not need to come back for ongoing monthly INR checks. This of warfarin, but fortunately her INR today is 2.7. She remains in atrial fibrillation with a CHADSVASC score of 4. She also has a history of thoracic aortic aneurysm measuring up to 4.8 cm. This is been stable and recently when she had a neck CT, this is partially visualized and also measured 4.8 cm.   Nicole [name] returns today for hospital follow-up. Unfortunately in September she was hospitalized after acute confusion. Seen in the ER a CT scan indicated an acute intracerebral hemorrhage. This was significant with mass effect. Neurology and neurosurgery were consult it. She was on warfarin however INR was just above 3. This was urgently reversed. Blood pressure however was elevated at over 160/120. It was thought that this bleeding was due to hypertensive emergency. Subsequently she had a long hospitalization and rehabilitation. She was taken off of warfarin and recently  followed up with her neurologist , who suggested she could go on low-dose aspirin. She understands after and explanation by me today that the risk of ischemic stroke is still quite high.  Her CHADSVASC or is at least 6.  We did spend some time discussing an alternative which may be the watchman left atrial appendage occluder device.  12/09/2015  Nicole [name] was seen back  today in follow-up. Unfortunately she's had bleeding on anticoagulation and is at high risk for anticoagulation going forward. I had a discussion with her neurologist Dr. Leonie Man regarding the possibility of the watchman and left atrial appendage occluder device. The concern is that she would have to be on 2 months of anticoagulation which he did not feel that she would tolerate. Therefore she is deemed not an anticoagulation candidate and if she is not a watchman candidate would remain on aspirin but at high risk for stroke. Heart failure seems to be well compensated. Blood pressure is at goal weight is stable.  07/27/2016  Nicole [name] was seen back today in follow-up. Overall she is doing fairly well. She's had close to 10 pound weight gain over the past several months however this may be a weight "rebound". She initially had lost some weight and feels like her appetite has improved somewhat and she seems to be eating better. She denies any worsening shortness of breath, orthopnea or lower extremity edema. Sure he is slightly elevated today. Memory seems to be somewhat worse according to her caregiver.  01/24/2017  Nicole [name] returns for follow-up. Unfortunately she was recently hospitalized with recurrent intracerebral hemorrhage. She has chronic A. fib but is not a candidate for anticoagulation or watchman. She's been on low-dose aspirin however prognosis is poor. She's had dementia related to this and most recently a few days ago her son died. She seems to be very upset with this and anxious. She is asking for some medication to help with this but I deferred to her primary care provider. She does have a history of thoracic aneurysm which is been stable by repeat CT angiogram that was performed during this recent hospitalization of 4.8 cm. Regardless she is not a candidate for surgery. She does have a history of congestive heart failure and EF had improved up to 55-60% by echo in January 2017. Apparently a  palliative care discussion took place and I think it would be very reasonable to involve them in her care going forward.  PMHx:   Past Medical History:  Diagnosis Date  . AAA (abdominal aortic aneurysm) ([place])   . Acute upper respiratory infections of unspecified site   . Anemia   . Anxiety   . Aortic aneurysm of unspecified site without mention of rupture   . Arthritis   . Atrial fibrillation ([place])   . Cervicalgia   . Cholelithiasis   . Chronic systolic heart failure ([place])   . CKD (chronic kidney disease)   . Colon cancer ([place]) 07/1997  . Depression   . Diabetes mellitus ([place])   . Dizziness and giddiness   . Electrolyte and fluid disorders not elsewhere classified   . GERD (gastroesophageal reflux disease)   . Gout, unspecified   . Hemorrhage of rectum and anus   . Hypercholesteremia   . Hypertension   . Hypoglycemia, unspecified   . Hypopotassemia   . Hypothyroidism   . Kidney stone    renal calculi  . Malignant neoplasm of colon, unspecified site   . Other chronic allergic conjunctivitis   .  Other specified cardiac dysrhythmias(427.89)   . Pain in joint, lower leg   . Perforation of bile duct   . Proteinuria   . Shortness of breath   . Small bowel obstruction due to adhesions ([place]) 05/16/2012  . Stroke ([place])   . Swelling, mass, or lump in head and neck   . Thoracic aneurysm without mention of rupture    4.6 cm Asc Aortic aneurysm, CT 07/2015    Past Surgical History:  Procedure Laterality Date  . CARDIAC CATHETERIZATION  2008   moderate severe pulm HTN; with elevted pulm capillary wedge pressure   . CHOLECYSTECTOMY  05/14/2012   Procedure: LAPAROSCOPIC CHOLECYSTECTOMY WITH INTRAOPERATIVE CHOLANGIOGRAM;  Surgeon: Haywood Lasso, MD;  Location: [facility];  Service: General;  Laterality: N/A;  . COLON SURGERY     to remove colon ca  . ERCP  05/17/2012   Procedure: ENDOSCOPIC RETROGRADE CHOLANGIOPANCREATOGRAPHY (ERCP);  Surgeon: Missy Sabins, MD;  Location: [facility];   Service: Gastroenterology;  Laterality: N/A;  . ERCP  08/12/2012   Procedure: ENDOSCOPIC RETROGRADE CHOLANGIOPANCREATOGRAPHY (ERCP);  Surgeon: Missy Sabins, MD;  Location: [facility] ENDOSCOPY;  Service: Endoscopy;  Laterality: N/A;  . HEMICOLECTOMY  08/11/1997  . KIDNEY STONE SURGERY    . TRANSTHORACIC ECHOCARDIOGRAM  11/2009   EF 45-50%, mild-mod AV regurg; mild MR; mod TR; ascending aorta mildly dilated    FAMHx:  Family History  Problem Relation Age of Onset  . Heart disease Mother   . Stroke Father   . Hypertension Daughter   . Hypertension Son   . Diabetes Sister   . Hypertension Son     SOCHx:   reports that she has never smoked. She has never used smokeless tobacco. She reports that she does not drink alcohol or use drugs.  ALLERGIES:  Allergies  Allergen Reactions  . Iohexol Nausea And Vomiting  . Iodinated Diagnostic Agents Nausea And Vomiting  . Z-Pak [Azithromycin]     Reaction unknown by family    ROS: Pertinent items noted in HPI and remainder of comprehensive ROS otherwise negative.  HOME MEDS: Current Outpatient Prescriptions  Medication Sig Dispense Refill  . allopurinol (ZYLOPRIM) 100 MG tablet TAKE 1 TABLET BY MOUTH  DAILY (Patient taking differently: Take 100 mg by mouth once a day) 90 tablet 1  . diltiazem (CARDIZEM CD) 120 MG 24 hr capsule Take 1 capsule (120 mg total) by mouth daily. 30 capsule 1  . donepezil (ARICEPT) 10 MG tablet TAKE 1 TABLET BY MOUTH AT  BEDTIME (Patient taking differently: Take 10 mg by mouth at bedtime) 90 tablet 0  . furosemide (LASIX) 20 MG tablet Take 1 tablet (20 mg) by mouth every other day, may also take one additional tablet daily for weight gain greater than 3 lbs. (Patient taking differently: Take 20 mg by mouth every other day. And may also take one additional tablet daily for weight gain greater than 3 lbs.) 90 tablet 3  . isosorbide-hydrALAZINE (BIDIL) 20-37.5 MG tablet Take 2 tablets by mouth 3 (three) times daily. 90 tablet  1  . labetalol (NORMODYNE) 200 MG tablet Take 1 tablet (200 mg total) by mouth 2 (two) times daily. 60 tablet 1  . levothyroxine (SYNTHROID, LEVOTHROID) 50 MCG tablet TAKE 1 TABLET BY MOUTH  DAILY BEFORE BREAKFAST (Patient taking differently: Take 50 mcg by mouth daily before breakfast) 90 tablet 0  . lisinopril (PRINIVIL,ZESTRIL) 20 MG tablet Take 1 tablet (20 mg total) by mouth daily. 30 tablet 1  . potassium  chloride (K-DUR) 10 MEQ tablet Take 1 tablet (10 mEq total) by mouth every other day. 45 tablet 3   No current facility-administered medications for this visit.     LABS/IMAGING: No results found for this or any previous visit (from the past 48 hour(s)). No results found.  VITALS: BP (!) 157/76   Pulse 95   Wt 153 lb 12.8 oz (69.8 kg)   BMI 23.39 kg/m   EXAM: General appearance: alert, no distress and seating in wheelchair Neck: no carotid bruit and no JVD Lungs: clear to auscultation bilaterally Heart: irregularly irregular rhythm Abdomen: soft, non-tender; bowel sounds normal; no masses,  no organomegaly Extremities: extremities normal, atraumatic, no cyanosis or edema Pulses: 2+ and symmetric Skin: Skin color, texture, turgor normal. No rashes or lesions Neurologic: Mental status: Slow thought process Psych: Appears sad  EKG: Atrial flutter with variable AV block at 95, incomplete right bundle branch block  ASSESSMENT: 1. Chronic atrial fibrillation - CHADSVASC score of 6 (not on anticoagulation now due to [place]) - not a watchman candidate, maintained on aspirin 2. Recent stroke with intracerebral hemorrhage 3. History of congestive heart failure, EF of 45% - up to 55-60% by echo (07/2015) 4. Hypertension 5. History of thoracic aneurysm - stable at 4.6-4.8 cm (07/2015) 6. Diabetes  7. Dyslipidemia 8. Chronic kidney disease  PLAN: 1.   [name] unfortunately has had recurrent intracerebral hemorrhage and recent admission for weakness. At this point padded cares  involved and I think there is little else to do for her. She is on low-dose aspirin. She remains in A. fib. She has a history of thoracic aneurysm which has been stable. She is quite sad now recent loss of her son a few days ago. He was only in his 63s. At this point there is little room for medication changes. Plan to see her back in 6 months. Overall I agree her prognosis is poor with recurrent intracerebral hemorrhage and palliative care is a very important service for her.  Pixie Casino, MD, [facility] Attending Cardiologist [place] 01/24/2017, 11:45 AM

## 2017-01-24 NOTE — Telephone Encounter (Signed)
A call was received from patient's in home aid asking that patient be given something to help calm her nerves so that she can make it through her son's funeral tomorrow.   The Aid was not able to tell me if patient has taken nerve medication in the past but someone who identified as her sister stated that she has taken medication for her nerves in the past.   I informed all parties on the line that I could not discuss pt records or medications with anyone other than the DPR. They stated that they would come by office to see what could be done.   Please advise

## 2017-01-24 NOTE — Telephone Encounter (Signed)
Agree, need to speak with her designated party to see what the situation is. Pt missed her follow up appt after hospitalization.

## 2017-01-24 NOTE — Telephone Encounter (Signed)
Left message on voicemail for patient's daughter to return call when available   

## 2017-01-28 ENCOUNTER — Telehealth: Payer: Self-pay

## 2017-01-28 DIAGNOSIS — Z7982 Long term (current) use of aspirin: Secondary | ICD-10-CM | POA: Diagnosis not present

## 2017-01-28 DIAGNOSIS — I2699 Other pulmonary embolism without acute cor pulmonale: Secondary | ICD-10-CM | POA: Diagnosis not present

## 2017-01-28 DIAGNOSIS — E039 Hypothyroidism, unspecified: Secondary | ICD-10-CM | POA: Diagnosis not present

## 2017-01-28 DIAGNOSIS — F0150 Vascular dementia without behavioral disturbance: Secondary | ICD-10-CM | POA: Diagnosis not present

## 2017-01-28 DIAGNOSIS — I130 Hypertensive heart and chronic kidney disease with heart failure and stage 1 through stage 4 chronic kidney disease, or unspecified chronic kidney disease: Secondary | ICD-10-CM | POA: Diagnosis not present

## 2017-01-28 DIAGNOSIS — R001 Bradycardia, unspecified: Secondary | ICD-10-CM | POA: Diagnosis not present

## 2017-01-28 DIAGNOSIS — D631 Anemia in chronic kidney disease: Secondary | ICD-10-CM | POA: Diagnosis not present

## 2017-01-28 DIAGNOSIS — I482 Chronic atrial fibrillation: Secondary | ICD-10-CM | POA: Diagnosis not present

## 2017-01-28 DIAGNOSIS — I69354 Hemiplegia and hemiparesis following cerebral infarction affecting left non-dominant side: Secondary | ICD-10-CM | POA: Diagnosis not present

## 2017-01-28 DIAGNOSIS — E1122 Type 2 diabetes mellitus with diabetic chronic kidney disease: Secondary | ICD-10-CM | POA: Diagnosis not present

## 2017-01-28 DIAGNOSIS — I5022 Chronic systolic (congestive) heart failure: Secondary | ICD-10-CM | POA: Diagnosis not present

## 2017-01-28 DIAGNOSIS — N182 Chronic kidney disease, stage 2 (mild): Secondary | ICD-10-CM | POA: Diagnosis not present

## 2017-01-28 NOTE — Telephone Encounter (Signed)
Patient was discharged from hospital 01/18/17. [place] was given order to see the patient. They need verbal orders for skill nurse to see patient twice a week for 5 weeks, then once a week for 4 weeks, medical and medication management. Has appt with Sherrie Mustache, NP 01/31/17 for hospital f/u. Gave verbal orders to [place].

## 2017-01-29 DIAGNOSIS — E039 Hypothyroidism, unspecified: Secondary | ICD-10-CM | POA: Diagnosis not present

## 2017-01-29 DIAGNOSIS — I130 Hypertensive heart and chronic kidney disease with heart failure and stage 1 through stage 4 chronic kidney disease, or unspecified chronic kidney disease: Secondary | ICD-10-CM | POA: Diagnosis not present

## 2017-01-29 DIAGNOSIS — I69354 Hemiplegia and hemiparesis following cerebral infarction affecting left non-dominant side: Secondary | ICD-10-CM | POA: Diagnosis not present

## 2017-01-29 DIAGNOSIS — Z7982 Long term (current) use of aspirin: Secondary | ICD-10-CM | POA: Diagnosis not present

## 2017-01-29 DIAGNOSIS — R001 Bradycardia, unspecified: Secondary | ICD-10-CM | POA: Diagnosis not present

## 2017-01-29 DIAGNOSIS — I5022 Chronic systolic (congestive) heart failure: Secondary | ICD-10-CM | POA: Diagnosis not present

## 2017-01-29 DIAGNOSIS — N182 Chronic kidney disease, stage 2 (mild): Secondary | ICD-10-CM | POA: Diagnosis not present

## 2017-01-29 DIAGNOSIS — D631 Anemia in chronic kidney disease: Secondary | ICD-10-CM | POA: Diagnosis not present

## 2017-01-29 DIAGNOSIS — I482 Chronic atrial fibrillation: Secondary | ICD-10-CM | POA: Diagnosis not present

## 2017-01-29 DIAGNOSIS — E1122 Type 2 diabetes mellitus with diabetic chronic kidney disease: Secondary | ICD-10-CM | POA: Diagnosis not present

## 2017-01-29 DIAGNOSIS — I2699 Other pulmonary embolism without acute cor pulmonale: Secondary | ICD-10-CM | POA: Diagnosis not present

## 2017-01-31 ENCOUNTER — Other Ambulatory Visit: Payer: Self-pay | Admitting: Nurse Practitioner

## 2017-01-31 ENCOUNTER — Ambulatory Visit (INDEPENDENT_AMBULATORY_CARE_PROVIDER_SITE_OTHER): Payer: Medicare Other | Admitting: Nurse Practitioner

## 2017-01-31 ENCOUNTER — Encounter: Payer: Self-pay | Admitting: Nurse Practitioner

## 2017-01-31 VITALS — BP 138/72 | HR 83 | Temp 98.3°F | Resp 17 | Ht 68.0 in | Wt 153.6 lb

## 2017-01-31 DIAGNOSIS — I610 Nontraumatic intracerebral hemorrhage in hemisphere, subcortical: Secondary | ICD-10-CM | POA: Diagnosis not present

## 2017-01-31 DIAGNOSIS — I10 Essential (primary) hypertension: Secondary | ICD-10-CM

## 2017-01-31 DIAGNOSIS — F419 Anxiety disorder, unspecified: Secondary | ICD-10-CM

## 2017-01-31 DIAGNOSIS — R946 Abnormal results of thyroid function studies: Secondary | ICD-10-CM | POA: Diagnosis not present

## 2017-01-31 DIAGNOSIS — F0150 Vascular dementia without behavioral disturbance: Secondary | ICD-10-CM

## 2017-01-31 DIAGNOSIS — I482 Chronic atrial fibrillation, unspecified: Secondary | ICD-10-CM

## 2017-01-31 DIAGNOSIS — R7989 Other specified abnormal findings of blood chemistry: Secondary | ICD-10-CM

## 2017-01-31 DIAGNOSIS — D649 Anemia, unspecified: Secondary | ICD-10-CM | POA: Diagnosis not present

## 2017-01-31 LAB — CBC WITH DIFFERENTIAL/PLATELET
Basophils Absolute: 0 cells/uL (ref 0–200)
Basophils Relative: 0 %
EOS PCT: 1 %
Eosinophils Absolute: 51 cells/uL (ref 15–500)
HCT: 31.1 % — ABNORMAL LOW (ref 35.0–45.0)
HEMOGLOBIN: 9.8 g/dL — AB (ref 11.7–15.5)
LYMPHS ABS: 867 {cells}/uL (ref 850–3900)
Lymphocytes Relative: 17 %
MCH: 23.4 pg — ABNORMAL LOW (ref 27.0–33.0)
MCHC: 31.5 g/dL — AB (ref 32.0–36.0)
MCV: 74.4 fL — ABNORMAL LOW (ref 80.0–100.0)
MPV: 11.2 fL (ref 7.5–12.5)
Monocytes Absolute: 561 cells/uL (ref 200–950)
Monocytes Relative: 11 %
NEUTROS ABS: 3621 {cells}/uL (ref 1500–7800)
Neutrophils Relative %: 71 %
PLATELETS: 389 10*3/uL (ref 140–400)
RBC: 4.18 MIL/uL (ref 3.80–5.10)
RDW: 19 % — ABNORMAL HIGH (ref 11.0–15.0)
WBC: 5.1 10*3/uL (ref 3.8–10.8)

## 2017-01-31 LAB — BASIC METABOLIC PANEL WITH GFR
BUN: 13 mg/dL (ref 7–25)
CALCIUM: 8.6 mg/dL (ref 8.6–10.4)
CHLORIDE: 103 mmol/L (ref 98–110)
CO2: 27 mmol/L (ref 20–31)
Creat: 1.19 mg/dL — ABNORMAL HIGH (ref 0.60–0.88)
GFR, EST NON AFRICAN AMERICAN: 42 mL/min — AB (ref >=60)
GFR, Est African American: 49 mL/min — ABNORMAL LOW (ref >=60)
Glucose, Bld: 247 mg/dL — ABNORMAL HIGH (ref 65–99)
Potassium: 3.8 mmol/L (ref 3.5–5.3)
SODIUM: 138 mmol/L (ref 135–146)

## 2017-01-31 MED ORDER — SERTRALINE HCL 50 MG PO TABS: 50.0000 mg | ORAL_TABLET | Freq: Every day | ORAL | 2 refills | Status: DC

## 2017-01-31 NOTE — Progress Notes (Signed)
Careteam: Patient Care Team: [name], DO as PCP - General (Geriatric Medicine) Neldon Mc, MD as Consulting Physician (General Surgery) Debara Pickett Nadean Corwin, MD as Consulting Physician (Cardiology) Maia Breslow, MD as Consulting Physician (Orthopedic Surgery) Rutherford Guys, MD as Consulting Physician (Ophthalmology)  Advanced Directive information Does Patient Have a Medical Advance Directive?: Yes, Type of Advance Directive: Living will  Allergies  Allergen Reactions  . Iohexol Nausea And Vomiting  . Iodinated Diagnostic Agents Nausea And Vomiting  . Z-Pak [Azithromycin]     Reaction unknown by family    Chief Complaint  Patient presents with  . Hospitalization Follow-up    Pt is being seen due hospital stay at [facility] 6/22 to 6/29.   . Other    caregive, [name], in room.      HPI: Patient is a 83 y.o. female seen in the office today for hospital follow up. Pt with complicated past medical history significant for history of intracranial hemorrhage, vascular dementia, chronic atrial fibrillation and PE on Eliquis, history of GI bleed, hypertension, chronic kidney disease stage II-III, and hypothyroidism, who presented to the emergency department evaluation of generalized weakness, lethargy, and respiratory distress. Upon further evaluation patient was found to have multiple areas and brain with hemorrhaging. Which includes acute small right basal ganglia hemorrhage without mass effect left frontal lobe hematoma, old hemorrhagic right basal ganglion infarct. Neurology was consulted and recommended NO anticoagulation therapy and once hematoma resolved to possibly use ASA.  Goal blood pressure <180. She was also bradycardic and metoprolol stopped but started on labetalol BID for proper blood pressure control.  Checking blood pressure and HR at home reports this has been at goal.  No concerns today from caregiver or pt.  Short of breath only when doing "a lot" it comes and  goes. No significant shortness of breath. No chest pains.  Working with home health at this time.  Reports increase in anxiety. Has always been a little nervous but much worse since son passed away. No anxiety attacks. Sleeping well.  Review of Systems:  Per caregiver Review of Systems  Constitutional: Negative for chills, fever and malaise/fatigue.  HENT: Negative for congestion and hearing loss.   Eyes: Negative for blurred vision.  Respiratory: Negative for cough and shortness of breath.   Cardiovascular: Negative for chest pain, palpitations and leg swelling.  Gastrointestinal: Negative for abdominal pain.  Genitourinary: Negative for dysuria.  Musculoskeletal: Negative for falls.  Skin: Negative for itching and rash.  Neurological: Negative for dizziness, loss of consciousness and weakness.  Endo/Heme/Allergies: Does not bruise/bleed easily.  Psychiatric/Behavioral: Positive for memory loss. Negative for depression. The patient is not nervous/anxious and does not have insomnia.        Oriented to person only    MMSE - Mini Mental State Exam 05/24/2016 06/09/2015 04/23/2013  Not completed: - (No Data) -  Orientation to time 0 0 5  Orientation to Place 4 0 5  Registration 3 3 3   Attention/ Calculation 4 5 5   Recall 3 0 2  Language- name 2 objects 2 2 2   Language- repeat 1 1 1   Language- follow 3 step command 2 3 3   Language- read & follow direction 0 1 1  Write a sentence 1 1 1   Copy design 0 0 1  Total score 20 16 29     Past Medical History:  Diagnosis Date  . AAA (abdominal aortic aneurysm) ([place])   . Acute upper respiratory infections of unspecified site   .  Anemia   . Anxiety   . Aortic aneurysm of unspecified site without mention of rupture   . Arthritis   . Atrial fibrillation ([place])   . Cervicalgia   . Cholelithiasis   . Chronic systolic heart failure ([place])   . CKD (chronic kidney disease)   . Colon cancer ([place]) 07/1997  . Depression   . Diabetes mellitus ([place])    . Dizziness and giddiness   . Electrolyte and fluid disorders not elsewhere classified   . GERD (gastroesophageal reflux disease)   . Gout, unspecified   . Hemorrhage of rectum and anus   . Hypercholesteremia   . Hypertension   . Hypoglycemia, unspecified   . Hypopotassemia   . Hypothyroidism   . Kidney stone    renal calculi  . Malignant neoplasm of colon, unspecified site   . Other chronic allergic conjunctivitis   . Other specified cardiac dysrhythmias(427.89)   . Pain in joint, lower leg   . Perforation of bile duct   . Proteinuria   . Shortness of breath   . Small bowel obstruction due to adhesions ([place]) 05/16/2012  . Stroke ([place])   . Swelling, mass, or lump in head and neck   . Thoracic aneurysm without mention of rupture    4.6 cm Asc Aortic aneurysm, CT 07/2015   Past Surgical History:  Procedure Laterality Date  . CARDIAC CATHETERIZATION  2008   moderate severe pulm HTN; with elevted pulm capillary wedge pressure   . CHOLECYSTECTOMY  05/14/2012   Procedure: LAPAROSCOPIC CHOLECYSTECTOMY WITH INTRAOPERATIVE CHOLANGIOGRAM;  Surgeon: Haywood Lasso, MD;  Location: [place];  Service: General;  Laterality: N/A;  . COLON SURGERY     to remove colon ca  . ERCP  05/17/2012   Procedure: ENDOSCOPIC RETROGRADE CHOLANGIOPANCREATOGRAPHY (ERCP);  Surgeon: Missy Sabins, MD;  Location: [place];  Service: Gastroenterology;  Laterality: N/A;  . ERCP  08/12/2012   Procedure: ENDOSCOPIC RETROGRADE CHOLANGIOPANCREATOGRAPHY (ERCP);  Surgeon: Missy Sabins, MD;  Location: [facility] ENDOSCOPY;  Service: Endoscopy;  Laterality: N/A;  . HEMICOLECTOMY  08/11/1997  . KIDNEY STONE SURGERY    . TRANSTHORACIC ECHOCARDIOGRAM  11/2009   EF 45-50%, mild-mod AV regurg; mild MR; mod TR; ascending aorta mildly dilated   Social History:   reports that she has never smoked. She has never used smokeless tobacco. She reports that she does not drink alcohol or use drugs.  Family History  Problem Relation Age of  Onset  . Heart disease Mother   . Stroke Father   . Hypertension Daughter   . Hypertension Son   . Diabetes Sister   . Hypertension Son     Medications: Patient's Medications  New Prescriptions   No medications on file  Previous Medications   ALLOPURINOL (ZYLOPRIM) 100 MG TABLET    TAKE 1 TABLET BY MOUTH  DAILY   ASPIRIN EC 81 MG TABLET    Take 81 mg by mouth daily.   ASSURE COMFORT LANCETS 30G MISC    Use as directed to check blood sugar.   BLOOD GLUCOSE CALIBRATION (OT ULTRA/FASTTK CNTRL SOLN) SOLN    Use as directed.   DILTIAZEM (CARDIZEM CD) 120 MG 24 HR CAPSULE    Take 1 capsule (120 mg total) by mouth daily.   DONEPEZIL (ARICEPT) 10 MG TABLET    TAKE 1 TABLET BY MOUTH AT  BEDTIME   FUROSEMIDE (LASIX) 20 MG TABLET    Take 1 tablet (20 mg) by mouth every other day, may also  take one additional tablet daily for weight gain greater than 3 lbs.   ISOSORBIDE-HYDRALAZINE (BIDIL) 20-37.5 MG TABLET    Take 2 tablets by mouth 3 (three) times daily.   LABETALOL (NORMODYNE) 200 MG TABLET    Take 1 tablet (200 mg total) by mouth 2 (two) times daily.   LANCET DEVICES (ADJUSTABLE LANCING DEVICE) MISC    Use as directed.   LEVOTHYROXINE (SYNTHROID, LEVOTHROID) 50 MCG TABLET    TAKE 1 TABLET BY MOUTH  DAILY BEFORE BREAKFAST   LISINOPRIL (PRINIVIL,ZESTRIL) 20 MG TABLET    Take 1 tablet (20 mg total) by mouth daily.   ONE TOUCH ULTRA TEST TEST STRIP    Use as directed to check blood sugar.   POTASSIUM CHLORIDE (K-DUR) 10 MEQ TABLET    Take 1 tablet (10 mEq total) by mouth every other day.  Modified Medications   No medications on file  Discontinued Medications   No medications on file     Physical Exam:  Vitals:   01/31/17 0919  BP: 138/72  Pulse: 83  Resp: 17  Temp: 98.3 F (36.8 C)  TempSrc: Oral  SpO2: 97%  Weight: 153 lb 9.6 oz (69.7 kg)  Height: 5' 8"  (1.727 m)   Body mass index is 23.35 kg/m.  Physical Exam  Constitutional: She appears well-developed and well-nourished.  No distress.  Cardiovascular: Intact distal pulses.   irreg irreg  Pulmonary/Chest: Effort normal and breath sounds normal. No respiratory distress. She has no rales.  Abdominal: Bowel sounds are normal.  Musculoskeletal: Normal range of motion.  Neurological: She is alert.  Oriented to person, poor historian; ambulates with cane at times, comes with her caregiver, someone with pt all the time.   Skin: Skin is warm and dry.  Psychiatric: Her affect is blunt.    Labs reviewed: Basic Metabolic Panel:  Recent Labs  02/17/16 1259 03/14/16 1608  04/11/16 1002  01/11/17 2123 01/12/17 0700 01/13/17 1236 01/14/17 0420 01/15/17 0433  NA 138 139  < > 139  < >  --  138 140 142 140  K 4.1 4.5  < > 3.8  < >  --  3.4* 3.4* 3.4* 3.7  CL 105 106  < > 104  < >  --  103 107 107 107  CO2 26 23  < > 29  < >  --  23 27 28 27   GLUCOSE 147* 100*  < > 128*  < >  --  136* 212* 114* 123*  BUN 21* 16  < > 15  < >  --  12 12 9 10   CREATININE 1.32* 1.11*  < > 1.28*  < >  --  1.07* 1.24* 1.23* 1.13*  CALCIUM 8.8* 9.4  < > 8.3*  < >  --  8.7* 8.8* 8.8* 8.7*  MG 1.7  --   --  1.4*  --   --  1.6*  --   --   --   TSH  --  2.76  --   --   --  0.244*  --   --   --   --   < > = values in this interval not displayed. Liver Function Tests:  Recent Labs  05/24/16 1106 07/20/16 1903 11/07/16 0856  AST 17 17 17   ALT 12 13* 10  ALKPHOS 82 79 107  BILITOT 0.5 0.4 1.0  PROT 7.1 7.2 7.6  ALBUMIN 3.6 3.3* 3.6    Recent Labs  07/20/16 1903  LIPASE 54*  Recent Labs  01/11/17 2123  AMMONIA 24   CBC:  Recent Labs  08/23/16 0347  11/07/16 0856 01/11/17 1405 01/11/17 2123 01/12/17 0700  WBC 12.1*  < > 6.3 5.8  --  7.2  NEUTROABS 9.7*  --  3,906  --   --  4.7  HGB 10.6*  < > 12.1 11.6*  --  13.0  HCT 31.6*  < > 39.0 33.9* 34.2 39.7  MCV 70.2*  < > 75.0* 68.9*  --  70.3*  PLT 267  < > 379 234  --  296  < > = values in this interval not displayed. Lipid Panel:  Recent Labs  11/07/16 0856    CHOL 174  HDL 47*  LDLCALC 109*  TRIG 89  CHOLHDL 3.7   TSH:  Recent Labs  03/14/16 1608 01/11/17 2123  TSH 2.76 0.244*   A1C: Lab Results  Component Value Date   HGBA1C 6.8 (H) 01/17/2017     Assessment/Plan 1. Chronic atrial fibrillation (HCC) -rate controlled. unfortunatelyunable to anticoagulate due to multiple areas of hemorrhagic bleeds. HR controlled.  - CBC with Differential/Platelets - BMP with eGFR  2. Essential hypertension Blood pressure stable; Tight blood pressure control was recommended by neurology; for goal SBP of >140 in the first 24 hours; then looking to maintain SBP < 180.  3. Nontraumatic subcortical hemorrhage of right cerebral hemisphere ([place]) -Basal ganglia hemorrhage: Secondary to amyloid angiopathy - Patient had multiple areas of hemorrhage.  -currently on no anticoagulation.  - Ambulatory referral to Neurology for out pt follow up.  -cont home health PT  4. Vascular dementia without behavioral disturbance Progressive decline, has around the clock supervision. conts on aricept 10 mg daily   5. Anxiety Pt reports anxiety with worsening anxiety since the death of her son.  - sertraline (ZOLOFT) 50 MG tablet; Take 1 tablet (50 mg total) by mouth daily.  Dispense: 30 tablet; Refill: 2  6. Abnormal TSH TSH low during hospitalization. Will follow up TSH with next labs in a month   Follow up in 4 weeks for anxiety and TSH check.  Carlos American. Harle Battiest  [facility] [phone] 8 am - 5 pm) [phone] (after hours)

## 2017-02-01 DIAGNOSIS — D631 Anemia in chronic kidney disease: Secondary | ICD-10-CM | POA: Diagnosis not present

## 2017-02-01 DIAGNOSIS — E1122 Type 2 diabetes mellitus with diabetic chronic kidney disease: Secondary | ICD-10-CM | POA: Diagnosis not present

## 2017-02-01 DIAGNOSIS — I482 Chronic atrial fibrillation: Secondary | ICD-10-CM | POA: Diagnosis not present

## 2017-02-01 DIAGNOSIS — I130 Hypertensive heart and chronic kidney disease with heart failure and stage 1 through stage 4 chronic kidney disease, or unspecified chronic kidney disease: Secondary | ICD-10-CM | POA: Diagnosis not present

## 2017-02-01 DIAGNOSIS — I5022 Chronic systolic (congestive) heart failure: Secondary | ICD-10-CM | POA: Diagnosis not present

## 2017-02-01 DIAGNOSIS — Z7982 Long term (current) use of aspirin: Secondary | ICD-10-CM | POA: Diagnosis not present

## 2017-02-01 DIAGNOSIS — N182 Chronic kidney disease, stage 2 (mild): Secondary | ICD-10-CM | POA: Diagnosis not present

## 2017-02-01 DIAGNOSIS — I69354 Hemiplegia and hemiparesis following cerebral infarction affecting left non-dominant side: Secondary | ICD-10-CM | POA: Diagnosis not present

## 2017-02-01 DIAGNOSIS — I2699 Other pulmonary embolism without acute cor pulmonale: Secondary | ICD-10-CM | POA: Diagnosis not present

## 2017-02-01 DIAGNOSIS — E039 Hypothyroidism, unspecified: Secondary | ICD-10-CM | POA: Diagnosis not present

## 2017-02-01 DIAGNOSIS — R001 Bradycardia, unspecified: Secondary | ICD-10-CM | POA: Diagnosis not present

## 2017-02-01 LAB — IRON AND TIBC
%SAT: 13 % (ref 11–50)
Iron: 37 ug/dL — ABNORMAL LOW (ref 45–160)
TIBC: 279 ug/dL (ref 250–450)
UIBC: 242 ug/dL

## 2017-02-01 MED ORDER — FERROUS SULFATE 325 (65 FE) MG PO TBEC: 325.0000 mg | DELAYED_RELEASE_TABLET | Freq: Two times a day (BID) | ORAL | 3 refills | Status: DC

## 2017-02-01 NOTE — Addendum Note (Signed)
Addended by: Denyse Amass on: 02/01/2017 09:58 AM   Modules accepted: Orders

## 2017-02-04 ENCOUNTER — Other Ambulatory Visit: Payer: Self-pay

## 2017-02-04 DIAGNOSIS — R790 Abnormal level of blood mineral: Secondary | ICD-10-CM

## 2017-02-05 DIAGNOSIS — Z7982 Long term (current) use of aspirin: Secondary | ICD-10-CM | POA: Diagnosis not present

## 2017-02-05 DIAGNOSIS — I69354 Hemiplegia and hemiparesis following cerebral infarction affecting left non-dominant side: Secondary | ICD-10-CM | POA: Diagnosis not present

## 2017-02-05 DIAGNOSIS — E039 Hypothyroidism, unspecified: Secondary | ICD-10-CM | POA: Diagnosis not present

## 2017-02-05 DIAGNOSIS — I5022 Chronic systolic (congestive) heart failure: Secondary | ICD-10-CM | POA: Diagnosis not present

## 2017-02-05 DIAGNOSIS — I130 Hypertensive heart and chronic kidney disease with heart failure and stage 1 through stage 4 chronic kidney disease, or unspecified chronic kidney disease: Secondary | ICD-10-CM | POA: Diagnosis not present

## 2017-02-05 DIAGNOSIS — R001 Bradycardia, unspecified: Secondary | ICD-10-CM | POA: Diagnosis not present

## 2017-02-05 DIAGNOSIS — D631 Anemia in chronic kidney disease: Secondary | ICD-10-CM | POA: Diagnosis not present

## 2017-02-05 DIAGNOSIS — I2699 Other pulmonary embolism without acute cor pulmonale: Secondary | ICD-10-CM | POA: Diagnosis not present

## 2017-02-05 DIAGNOSIS — N182 Chronic kidney disease, stage 2 (mild): Secondary | ICD-10-CM | POA: Diagnosis not present

## 2017-02-05 DIAGNOSIS — E1122 Type 2 diabetes mellitus with diabetic chronic kidney disease: Secondary | ICD-10-CM | POA: Diagnosis not present

## 2017-02-05 DIAGNOSIS — I482 Chronic atrial fibrillation: Secondary | ICD-10-CM | POA: Diagnosis not present

## 2017-02-07 DIAGNOSIS — I69354 Hemiplegia and hemiparesis following cerebral infarction affecting left non-dominant side: Secondary | ICD-10-CM | POA: Diagnosis not present

## 2017-02-07 DIAGNOSIS — N182 Chronic kidney disease, stage 2 (mild): Secondary | ICD-10-CM | POA: Diagnosis not present

## 2017-02-07 DIAGNOSIS — I5022 Chronic systolic (congestive) heart failure: Secondary | ICD-10-CM | POA: Diagnosis not present

## 2017-02-07 DIAGNOSIS — I130 Hypertensive heart and chronic kidney disease with heart failure and stage 1 through stage 4 chronic kidney disease, or unspecified chronic kidney disease: Secondary | ICD-10-CM | POA: Diagnosis not present

## 2017-02-07 DIAGNOSIS — E039 Hypothyroidism, unspecified: Secondary | ICD-10-CM | POA: Diagnosis not present

## 2017-02-07 DIAGNOSIS — I2699 Other pulmonary embolism without acute cor pulmonale: Secondary | ICD-10-CM | POA: Diagnosis not present

## 2017-02-07 DIAGNOSIS — E1122 Type 2 diabetes mellitus with diabetic chronic kidney disease: Secondary | ICD-10-CM | POA: Diagnosis not present

## 2017-02-07 DIAGNOSIS — D631 Anemia in chronic kidney disease: Secondary | ICD-10-CM | POA: Diagnosis not present

## 2017-02-07 DIAGNOSIS — Z7982 Long term (current) use of aspirin: Secondary | ICD-10-CM | POA: Diagnosis not present

## 2017-02-07 DIAGNOSIS — I482 Chronic atrial fibrillation: Secondary | ICD-10-CM | POA: Diagnosis not present

## 2017-02-07 DIAGNOSIS — R001 Bradycardia, unspecified: Secondary | ICD-10-CM | POA: Diagnosis not present

## 2017-02-08 DIAGNOSIS — D631 Anemia in chronic kidney disease: Secondary | ICD-10-CM | POA: Diagnosis not present

## 2017-02-08 DIAGNOSIS — E1122 Type 2 diabetes mellitus with diabetic chronic kidney disease: Secondary | ICD-10-CM | POA: Diagnosis not present

## 2017-02-08 DIAGNOSIS — I2699 Other pulmonary embolism without acute cor pulmonale: Secondary | ICD-10-CM | POA: Diagnosis not present

## 2017-02-08 DIAGNOSIS — I482 Chronic atrial fibrillation: Secondary | ICD-10-CM | POA: Diagnosis not present

## 2017-02-08 DIAGNOSIS — I5022 Chronic systolic (congestive) heart failure: Secondary | ICD-10-CM | POA: Diagnosis not present

## 2017-02-08 DIAGNOSIS — Z7982 Long term (current) use of aspirin: Secondary | ICD-10-CM | POA: Diagnosis not present

## 2017-02-08 DIAGNOSIS — R001 Bradycardia, unspecified: Secondary | ICD-10-CM | POA: Diagnosis not present

## 2017-02-08 DIAGNOSIS — N182 Chronic kidney disease, stage 2 (mild): Secondary | ICD-10-CM | POA: Diagnosis not present

## 2017-02-08 DIAGNOSIS — E039 Hypothyroidism, unspecified: Secondary | ICD-10-CM | POA: Diagnosis not present

## 2017-02-08 DIAGNOSIS — I69354 Hemiplegia and hemiparesis following cerebral infarction affecting left non-dominant side: Secondary | ICD-10-CM | POA: Diagnosis not present

## 2017-02-08 DIAGNOSIS — I130 Hypertensive heart and chronic kidney disease with heart failure and stage 1 through stage 4 chronic kidney disease, or unspecified chronic kidney disease: Secondary | ICD-10-CM | POA: Diagnosis not present

## 2017-02-11 DIAGNOSIS — I2699 Other pulmonary embolism without acute cor pulmonale: Secondary | ICD-10-CM | POA: Diagnosis not present

## 2017-02-11 DIAGNOSIS — Z7982 Long term (current) use of aspirin: Secondary | ICD-10-CM | POA: Diagnosis not present

## 2017-02-11 DIAGNOSIS — I482 Chronic atrial fibrillation: Secondary | ICD-10-CM | POA: Diagnosis not present

## 2017-02-11 DIAGNOSIS — I130 Hypertensive heart and chronic kidney disease with heart failure and stage 1 through stage 4 chronic kidney disease, or unspecified chronic kidney disease: Secondary | ICD-10-CM | POA: Diagnosis not present

## 2017-02-11 DIAGNOSIS — E039 Hypothyroidism, unspecified: Secondary | ICD-10-CM | POA: Diagnosis not present

## 2017-02-11 DIAGNOSIS — D631 Anemia in chronic kidney disease: Secondary | ICD-10-CM | POA: Diagnosis not present

## 2017-02-11 DIAGNOSIS — R001 Bradycardia, unspecified: Secondary | ICD-10-CM | POA: Diagnosis not present

## 2017-02-11 DIAGNOSIS — I69354 Hemiplegia and hemiparesis following cerebral infarction affecting left non-dominant side: Secondary | ICD-10-CM | POA: Diagnosis not present

## 2017-02-11 DIAGNOSIS — E1122 Type 2 diabetes mellitus with diabetic chronic kidney disease: Secondary | ICD-10-CM | POA: Diagnosis not present

## 2017-02-11 DIAGNOSIS — N182 Chronic kidney disease, stage 2 (mild): Secondary | ICD-10-CM | POA: Diagnosis not present

## 2017-02-11 DIAGNOSIS — I5022 Chronic systolic (congestive) heart failure: Secondary | ICD-10-CM | POA: Diagnosis not present

## 2017-02-12 ENCOUNTER — Other Ambulatory Visit: Payer: Self-pay

## 2017-02-12 ENCOUNTER — Other Ambulatory Visit: Payer: Medicare Other

## 2017-02-12 DIAGNOSIS — I5022 Chronic systolic (congestive) heart failure: Secondary | ICD-10-CM | POA: Diagnosis not present

## 2017-02-12 DIAGNOSIS — I69354 Hemiplegia and hemiparesis following cerebral infarction affecting left non-dominant side: Secondary | ICD-10-CM | POA: Diagnosis not present

## 2017-02-12 DIAGNOSIS — I2699 Other pulmonary embolism without acute cor pulmonale: Secondary | ICD-10-CM | POA: Diagnosis not present

## 2017-02-12 DIAGNOSIS — R790 Abnormal level of blood mineral: Secondary | ICD-10-CM | POA: Diagnosis not present

## 2017-02-12 DIAGNOSIS — E1122 Type 2 diabetes mellitus with diabetic chronic kidney disease: Secondary | ICD-10-CM | POA: Diagnosis not present

## 2017-02-12 DIAGNOSIS — R001 Bradycardia, unspecified: Secondary | ICD-10-CM | POA: Diagnosis not present

## 2017-02-12 DIAGNOSIS — I130 Hypertensive heart and chronic kidney disease with heart failure and stage 1 through stage 4 chronic kidney disease, or unspecified chronic kidney disease: Secondary | ICD-10-CM | POA: Diagnosis not present

## 2017-02-12 DIAGNOSIS — E039 Hypothyroidism, unspecified: Secondary | ICD-10-CM | POA: Diagnosis not present

## 2017-02-12 DIAGNOSIS — I482 Chronic atrial fibrillation: Secondary | ICD-10-CM | POA: Diagnosis not present

## 2017-02-12 DIAGNOSIS — N182 Chronic kidney disease, stage 2 (mild): Secondary | ICD-10-CM | POA: Diagnosis not present

## 2017-02-12 DIAGNOSIS — Z7982 Long term (current) use of aspirin: Secondary | ICD-10-CM | POA: Diagnosis not present

## 2017-02-12 DIAGNOSIS — D631 Anemia in chronic kidney disease: Secondary | ICD-10-CM | POA: Diagnosis not present

## 2017-02-12 LAB — CBC WITH DIFFERENTIAL/PLATELET
BASOS ABS: 0 {cells}/uL (ref 0–200)
Basophils Relative: 0 %
EOS ABS: 62 {cells}/uL (ref 15–500)
EOS PCT: 1 %
HCT: 33.6 % — ABNORMAL LOW (ref 35.0–45.0)
Hemoglobin: 10.4 g/dL — ABNORMAL LOW (ref 11.7–15.5)
LYMPHS PCT: 15 %
Lymphs Abs: 930 cells/uL (ref 850–3900)
MCH: 23.3 pg — AB (ref 27.0–33.0)
MCHC: 31 g/dL — AB (ref 32.0–36.0)
MCV: 75.2 fL — AB (ref 80.0–100.0)
MONOS PCT: 9 %
Monocytes Absolute: 558 cells/uL (ref 200–950)
NEUTROS ABS: 4650 {cells}/uL (ref 1500–7800)
NEUTROS PCT: 75 %
PLATELETS: 326 10*3/uL (ref 140–400)
RBC: 4.47 MIL/uL (ref 3.80–5.10)
RDW: 19.1 % — ABNORMAL HIGH (ref 11.0–15.0)
WBC: 6.2 10*3/uL (ref 3.8–10.8)

## 2017-02-15 DIAGNOSIS — E039 Hypothyroidism, unspecified: Secondary | ICD-10-CM | POA: Diagnosis not present

## 2017-02-15 DIAGNOSIS — E1122 Type 2 diabetes mellitus with diabetic chronic kidney disease: Secondary | ICD-10-CM | POA: Diagnosis not present

## 2017-02-15 DIAGNOSIS — I69354 Hemiplegia and hemiparesis following cerebral infarction affecting left non-dominant side: Secondary | ICD-10-CM | POA: Diagnosis not present

## 2017-02-15 DIAGNOSIS — Z7982 Long term (current) use of aspirin: Secondary | ICD-10-CM | POA: Diagnosis not present

## 2017-02-15 DIAGNOSIS — I130 Hypertensive heart and chronic kidney disease with heart failure and stage 1 through stage 4 chronic kidney disease, or unspecified chronic kidney disease: Secondary | ICD-10-CM | POA: Diagnosis not present

## 2017-02-15 DIAGNOSIS — D631 Anemia in chronic kidney disease: Secondary | ICD-10-CM | POA: Diagnosis not present

## 2017-02-15 DIAGNOSIS — I2699 Other pulmonary embolism without acute cor pulmonale: Secondary | ICD-10-CM | POA: Diagnosis not present

## 2017-02-15 DIAGNOSIS — N182 Chronic kidney disease, stage 2 (mild): Secondary | ICD-10-CM | POA: Diagnosis not present

## 2017-02-15 DIAGNOSIS — I5022 Chronic systolic (congestive) heart failure: Secondary | ICD-10-CM | POA: Diagnosis not present

## 2017-02-15 DIAGNOSIS — I482 Chronic atrial fibrillation: Secondary | ICD-10-CM | POA: Diagnosis not present

## 2017-02-15 DIAGNOSIS — R001 Bradycardia, unspecified: Secondary | ICD-10-CM | POA: Diagnosis not present

## 2017-02-18 ENCOUNTER — Other Ambulatory Visit: Payer: Self-pay | Admitting: *Deleted

## 2017-02-18 DIAGNOSIS — F0150 Vascular dementia without behavioral disturbance: Secondary | ICD-10-CM

## 2017-02-18 MED ORDER — DONEPEZIL HCL 10 MG PO TABS
ORAL_TABLET | ORAL | 3 refills | Status: DC
Start: 2017-02-18 — End: 2017-08-08

## 2017-02-18 NOTE — Telephone Encounter (Signed)
[facility] 

## 2017-02-19 DIAGNOSIS — E1122 Type 2 diabetes mellitus with diabetic chronic kidney disease: Secondary | ICD-10-CM | POA: Diagnosis not present

## 2017-02-19 DIAGNOSIS — Z7982 Long term (current) use of aspirin: Secondary | ICD-10-CM | POA: Diagnosis not present

## 2017-02-19 DIAGNOSIS — R001 Bradycardia, unspecified: Secondary | ICD-10-CM | POA: Diagnosis not present

## 2017-02-19 DIAGNOSIS — I482 Chronic atrial fibrillation: Secondary | ICD-10-CM | POA: Diagnosis not present

## 2017-02-19 DIAGNOSIS — I130 Hypertensive heart and chronic kidney disease with heart failure and stage 1 through stage 4 chronic kidney disease, or unspecified chronic kidney disease: Secondary | ICD-10-CM | POA: Diagnosis not present

## 2017-02-19 DIAGNOSIS — D631 Anemia in chronic kidney disease: Secondary | ICD-10-CM | POA: Diagnosis not present

## 2017-02-19 DIAGNOSIS — N182 Chronic kidney disease, stage 2 (mild): Secondary | ICD-10-CM | POA: Diagnosis not present

## 2017-02-19 DIAGNOSIS — E039 Hypothyroidism, unspecified: Secondary | ICD-10-CM | POA: Diagnosis not present

## 2017-02-19 DIAGNOSIS — I5022 Chronic systolic (congestive) heart failure: Secondary | ICD-10-CM | POA: Diagnosis not present

## 2017-02-19 DIAGNOSIS — I69354 Hemiplegia and hemiparesis following cerebral infarction affecting left non-dominant side: Secondary | ICD-10-CM | POA: Diagnosis not present

## 2017-02-19 DIAGNOSIS — I2699 Other pulmonary embolism without acute cor pulmonale: Secondary | ICD-10-CM | POA: Diagnosis not present

## 2017-02-21 DIAGNOSIS — I69354 Hemiplegia and hemiparesis following cerebral infarction affecting left non-dominant side: Secondary | ICD-10-CM | POA: Diagnosis not present

## 2017-02-21 DIAGNOSIS — Z7982 Long term (current) use of aspirin: Secondary | ICD-10-CM | POA: Diagnosis not present

## 2017-02-21 DIAGNOSIS — E039 Hypothyroidism, unspecified: Secondary | ICD-10-CM | POA: Diagnosis not present

## 2017-02-21 DIAGNOSIS — I5022 Chronic systolic (congestive) heart failure: Secondary | ICD-10-CM | POA: Diagnosis not present

## 2017-02-21 DIAGNOSIS — R001 Bradycardia, unspecified: Secondary | ICD-10-CM | POA: Diagnosis not present

## 2017-02-21 DIAGNOSIS — I482 Chronic atrial fibrillation: Secondary | ICD-10-CM | POA: Diagnosis not present

## 2017-02-21 DIAGNOSIS — D631 Anemia in chronic kidney disease: Secondary | ICD-10-CM | POA: Diagnosis not present

## 2017-02-21 DIAGNOSIS — I2699 Other pulmonary embolism without acute cor pulmonale: Secondary | ICD-10-CM | POA: Diagnosis not present

## 2017-02-21 DIAGNOSIS — N182 Chronic kidney disease, stage 2 (mild): Secondary | ICD-10-CM | POA: Diagnosis not present

## 2017-02-21 DIAGNOSIS — I130 Hypertensive heart and chronic kidney disease with heart failure and stage 1 through stage 4 chronic kidney disease, or unspecified chronic kidney disease: Secondary | ICD-10-CM | POA: Diagnosis not present

## 2017-02-21 DIAGNOSIS — E1122 Type 2 diabetes mellitus with diabetic chronic kidney disease: Secondary | ICD-10-CM | POA: Diagnosis not present

## 2017-02-22 DIAGNOSIS — I69354 Hemiplegia and hemiparesis following cerebral infarction affecting left non-dominant side: Secondary | ICD-10-CM | POA: Diagnosis not present

## 2017-02-22 DIAGNOSIS — I130 Hypertensive heart and chronic kidney disease with heart failure and stage 1 through stage 4 chronic kidney disease, or unspecified chronic kidney disease: Secondary | ICD-10-CM | POA: Diagnosis not present

## 2017-02-22 DIAGNOSIS — E039 Hypothyroidism, unspecified: Secondary | ICD-10-CM | POA: Diagnosis not present

## 2017-02-22 DIAGNOSIS — I2699 Other pulmonary embolism without acute cor pulmonale: Secondary | ICD-10-CM | POA: Diagnosis not present

## 2017-02-22 DIAGNOSIS — R001 Bradycardia, unspecified: Secondary | ICD-10-CM | POA: Diagnosis not present

## 2017-02-22 DIAGNOSIS — I5022 Chronic systolic (congestive) heart failure: Secondary | ICD-10-CM | POA: Diagnosis not present

## 2017-02-22 DIAGNOSIS — I482 Chronic atrial fibrillation: Secondary | ICD-10-CM | POA: Diagnosis not present

## 2017-02-22 DIAGNOSIS — E1122 Type 2 diabetes mellitus with diabetic chronic kidney disease: Secondary | ICD-10-CM | POA: Diagnosis not present

## 2017-02-22 DIAGNOSIS — D631 Anemia in chronic kidney disease: Secondary | ICD-10-CM | POA: Diagnosis not present

## 2017-02-22 DIAGNOSIS — Z7982 Long term (current) use of aspirin: Secondary | ICD-10-CM | POA: Diagnosis not present

## 2017-02-22 DIAGNOSIS — N182 Chronic kidney disease, stage 2 (mild): Secondary | ICD-10-CM | POA: Diagnosis not present

## 2017-02-22 IMAGING — CR DG CHEST 2V
2 series · 2 of 2 positions shown · non-contrast
Comparison: 10/04/2014

CLINICAL DATA: Generalize weakness, confusion, hypertension and
rapid atrial fibrillation.

EXAM:
CHEST - 2 VIEW

[chest lat]
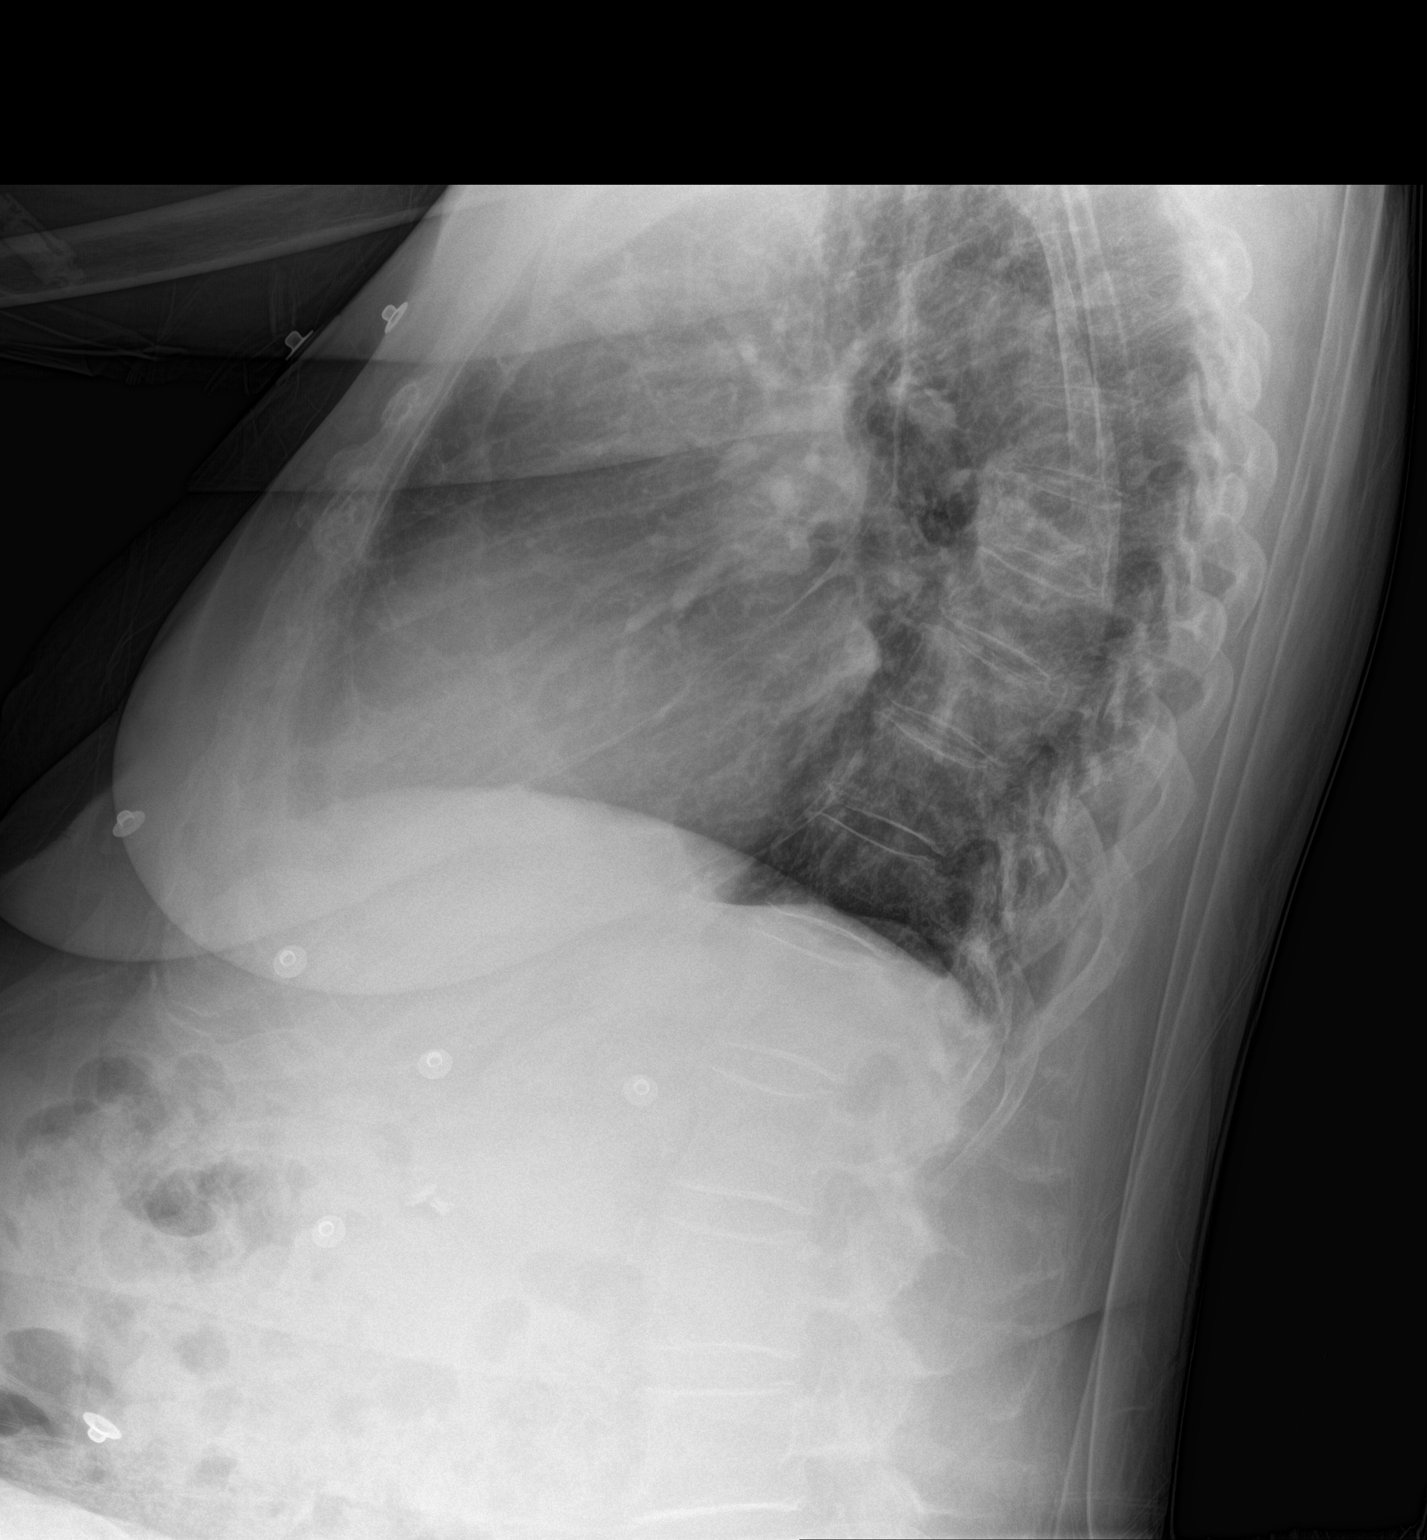

[chest ap]
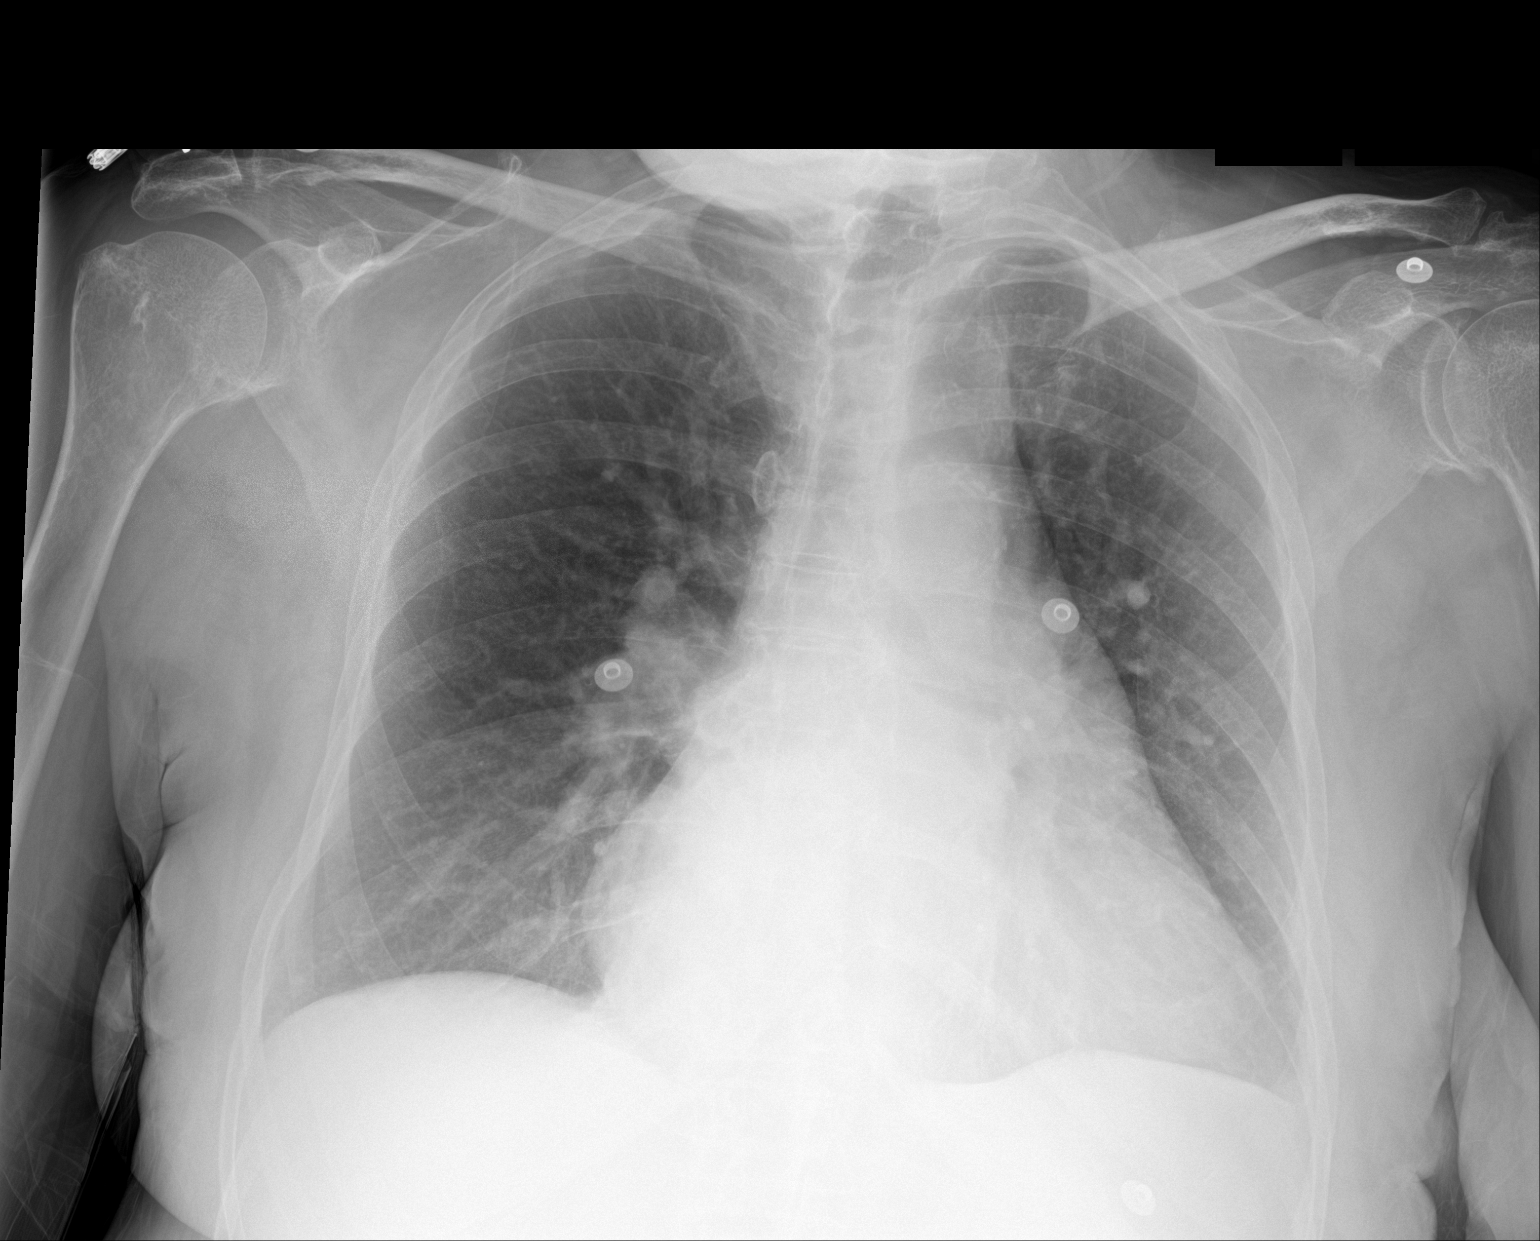

[2 of 2 positions shown; findings below may reference images not displayed]

FINDINGS: Stable mild cardiac enlargement. There is no evidence of pulmonary
edema, consolidation, pneumothorax, nodule or pleural fluid. Stable
mild spondylosis of the thoracic spine.
IMPRESSION: Stable mild cardiac enlargement.  No active disease.

## 2017-02-22 IMAGING — CT CT HEAD W/O CM
1 of 2 series · 15 of 30 positions shown, 19 images · non-contrast
Comparison: 02/12/2009

CLINICAL DATA: Generalize weakness, headache and confusion. History
of hypertension and atrial fibrillation with rapid ventricular
response.

EXAM:
CT HEAD WITHOUT CONTRAST
TECHNIQUE: Contiguous axial images were obtained from the base of the skull
through the vertex without intravenous contrast.

[Series 3: head 2.0 h70h · axial · 0.43mm/px · z∈[-149,-11]mm · 15 of 77 slices shown, 19 images]
[im 4/77  brain]
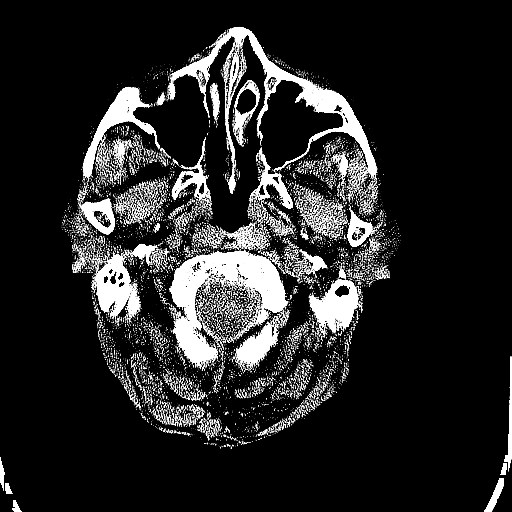
[im 4/77  bone]
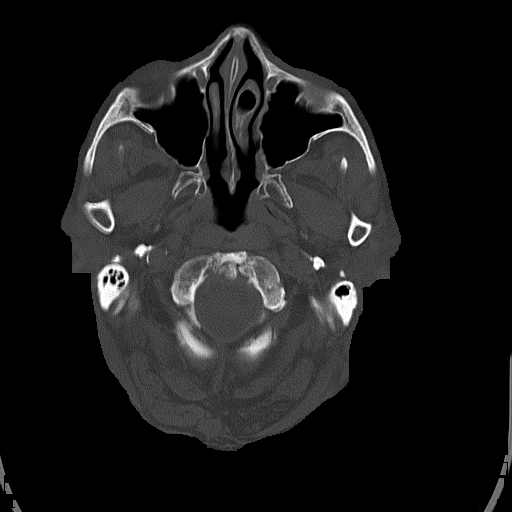
[im 8/77  brain]
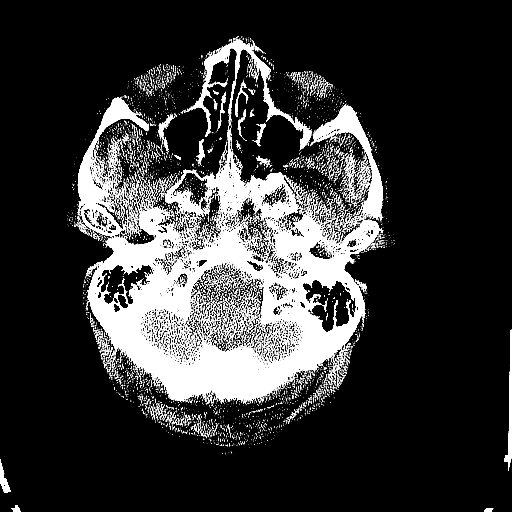
[im 16/77  brain]
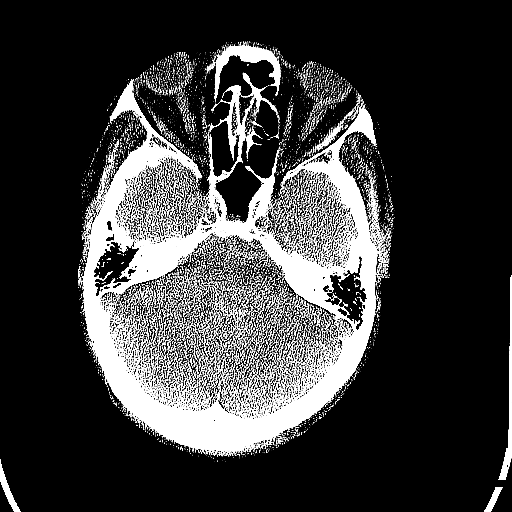
[im 20/77  brain]
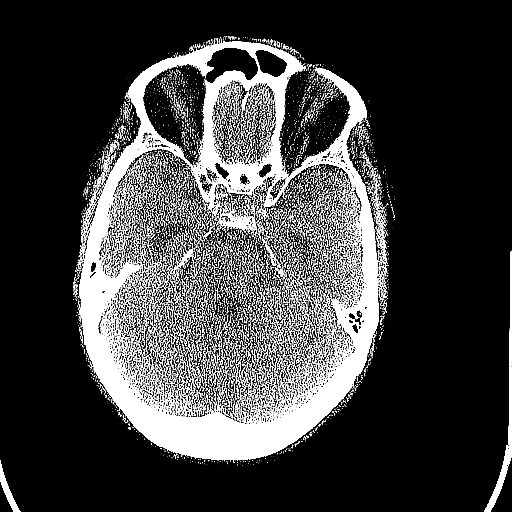
[im 23/77  brain]
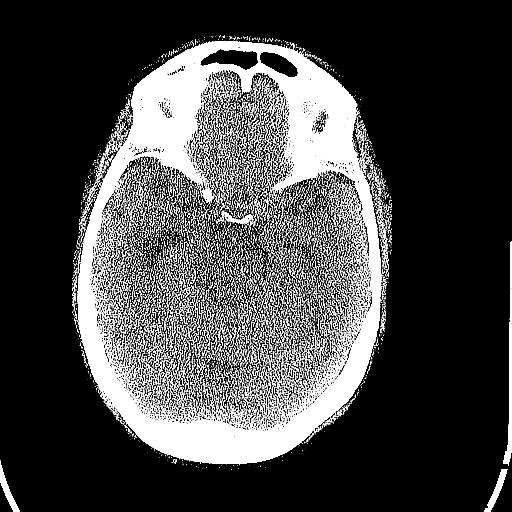
[im 23/77  bone]
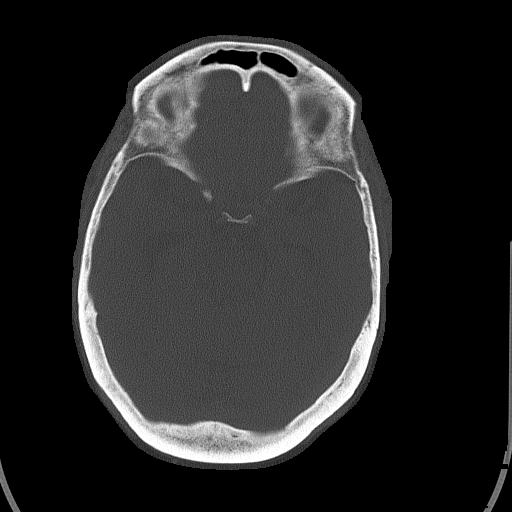
[im 27/77  brain]
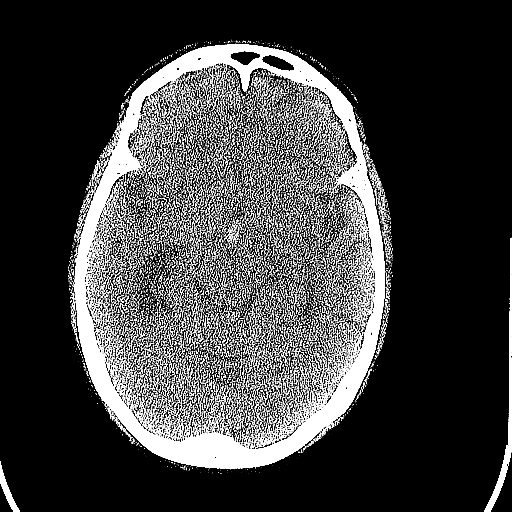
[im 35/77  brain]
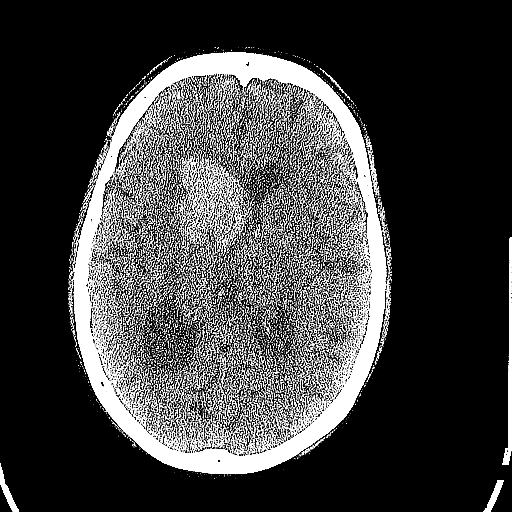
[im 39/77  brain]
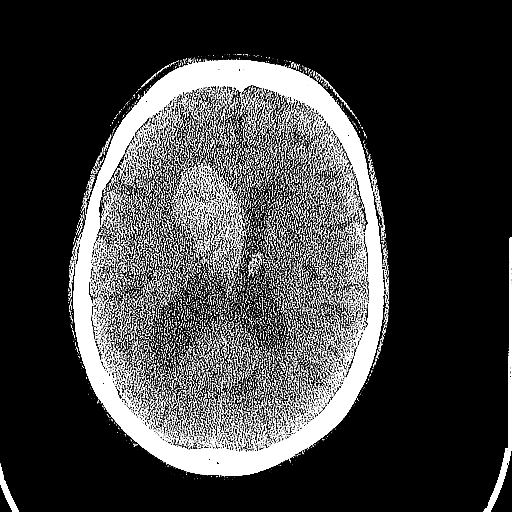
[im 42/77  brain]
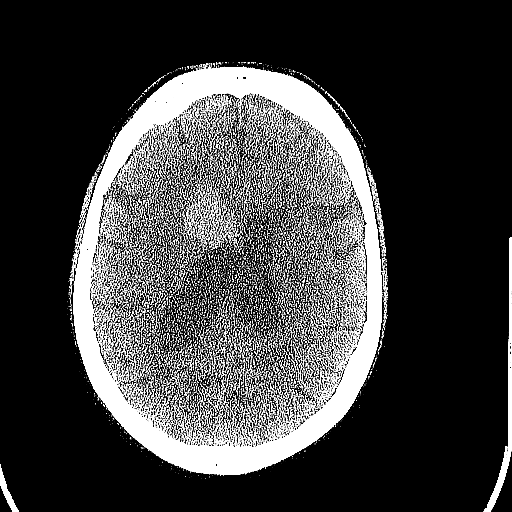
[im 42/77  bone]
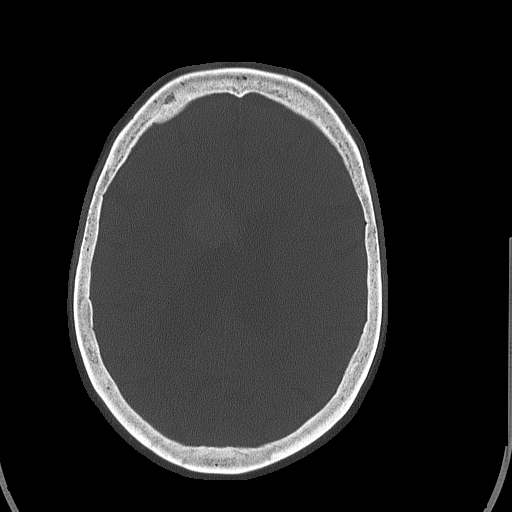
[im 50/77  brain]
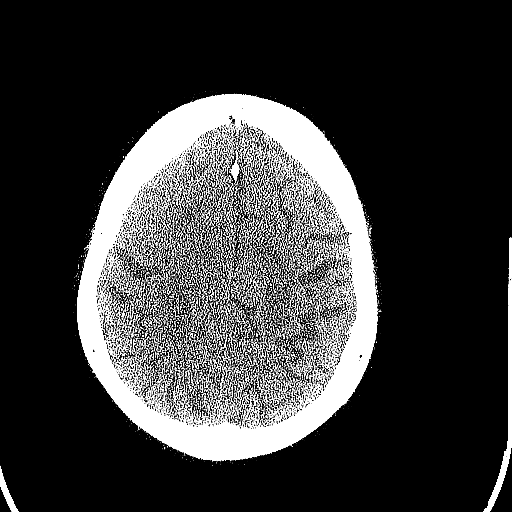
[im 54/77  brain]
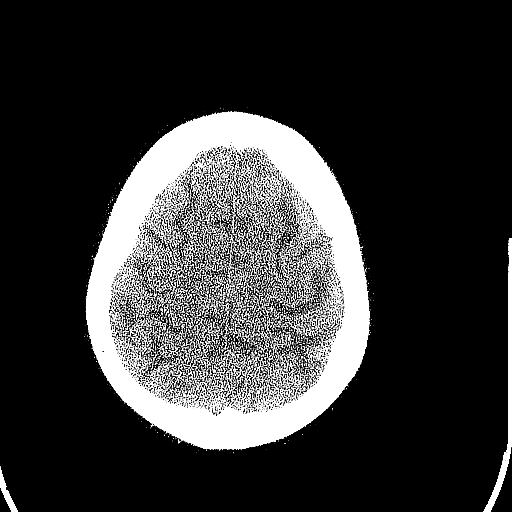
[im 58/77  brain]
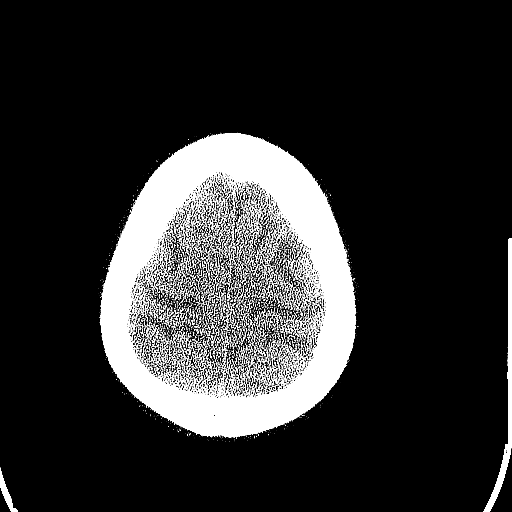
[im 61/77  brain]
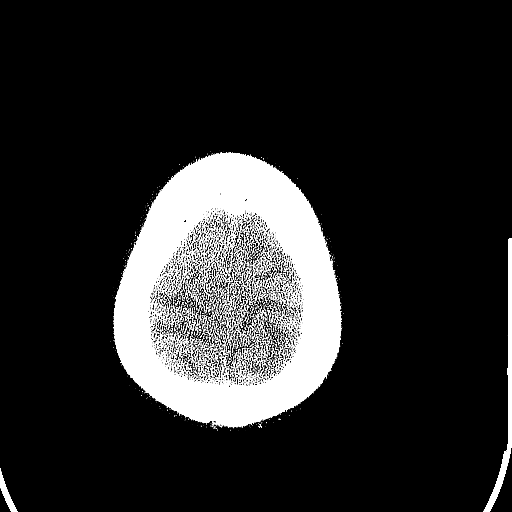
[im 61/77  bone]
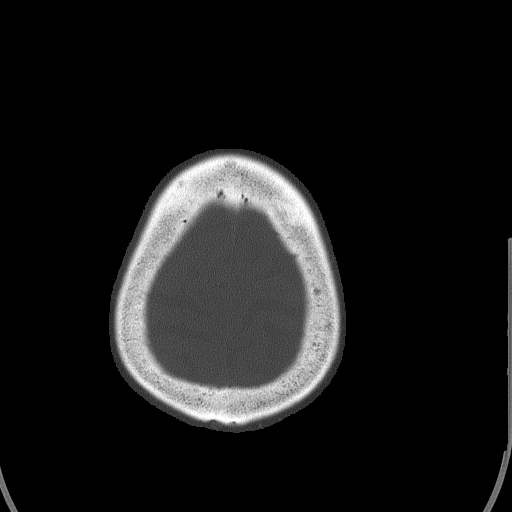
[im 69/77  brain]
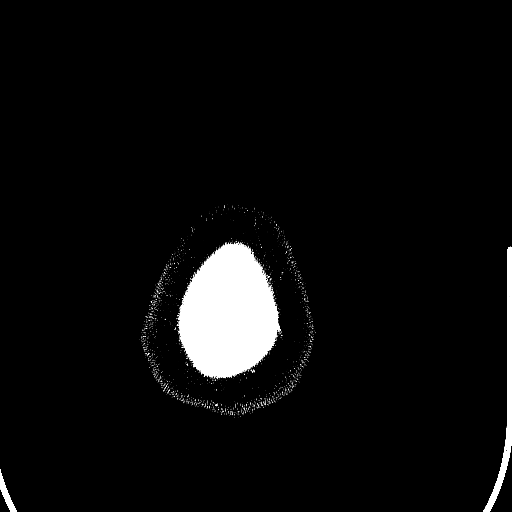
[im 73/77  brain]
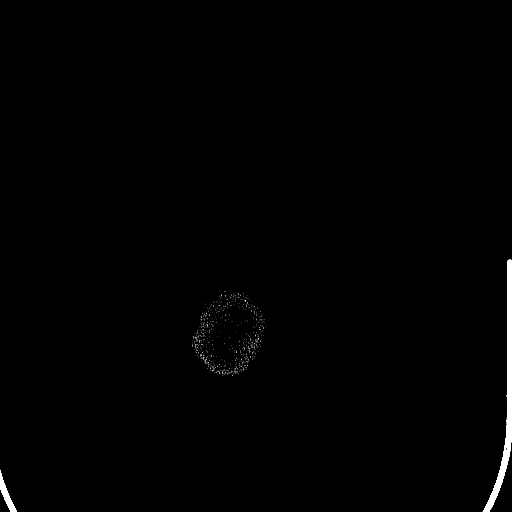

[15 of 30 positions shown; findings below may reference images not displayed]

FINDINGS: There is evidence of acute hemorrhage with a large parenchymal
hematoma present centered just superior to the frontal horn of the
right lateral ventricle, likely in the region of the caudate nucleus
with rupture of acute blood into the frontal foreign of the lateral
ventricle and blood also seen within the third ventricle. The
parenchymal hemorrhage measures roughly 3.2 x 4.6 x 3.0 cm in
dimensions. There is some surrounding edema and associated
asymmetric dilatation of the right lateral ventricle compared to the
left consistent with trapping of the ventricle developing
hydrocephalus. Some layering blood is also noted in the temporal
horn of the right lateral ventricle. Mild shift of the midline is
noted to the left of approximately 5 mm. No subarachnoid or
extra-axial hemorrhage identified. No evidence of cerebral
infarction. The skull is unremarkable.
IMPRESSION: Large acute hemorrhage centered in the region of the right caudate
nucleus with rupture into the lateral ventricle and evidence of
developing hydrocephalus and trapping of the right lateral
ventricle. There is a mild amount of midline shift to the left.
Parenchymal hemorrhage measures roughly 3.2 x 4.6 x 3.0 cm. This is
most likely a hypertensive parenchymal bleed.

These results were called by telephone at the time of interpretation
acknowledged these results.

## 2017-02-23 IMAGING — CT CT HEAD W/O CM
2 series · 15 of 30 positions shown, 19 images · non-contrast
Comparison: Yesterday

CLINICAL DATA: Intracerebral hemorrhage.

EXAM:
CT HEAD WITHOUT CONTRAST
TECHNIQUE: Contiguous axial images were obtained from the base of the skull
through the vertex without intravenous contrast.

[Series 201: head w/o, idose (1) · axial · non-contrast · 0.49mm/px · z∈[+82,+202]mm · 13 of 30 slices shown, 17 images]
[im 3/30  brain]
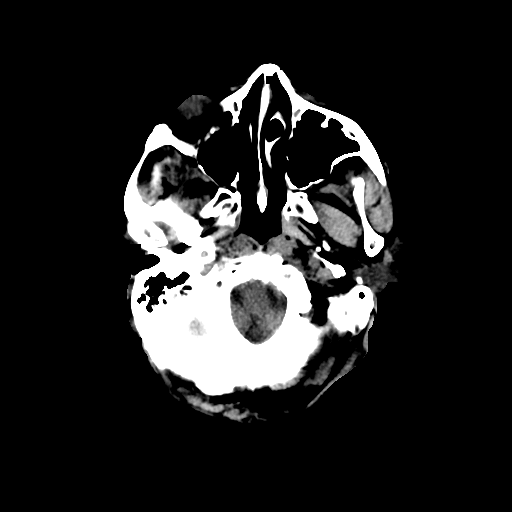
[im 3/30  bone]
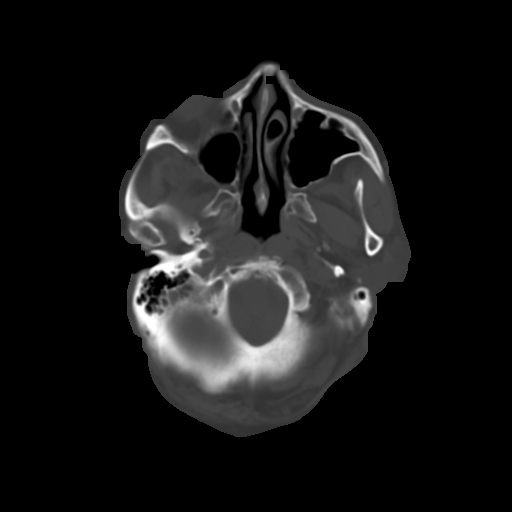
[im 5/30  brain]
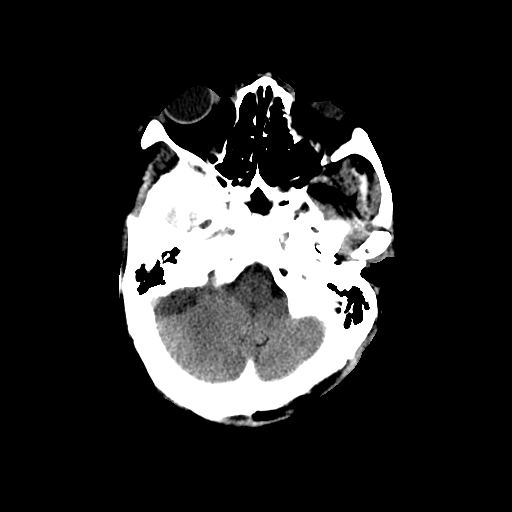
[im 7/30  brain]
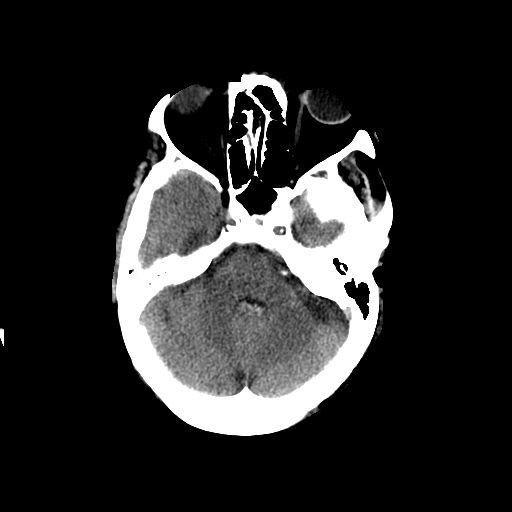
[im 9/30  brain]
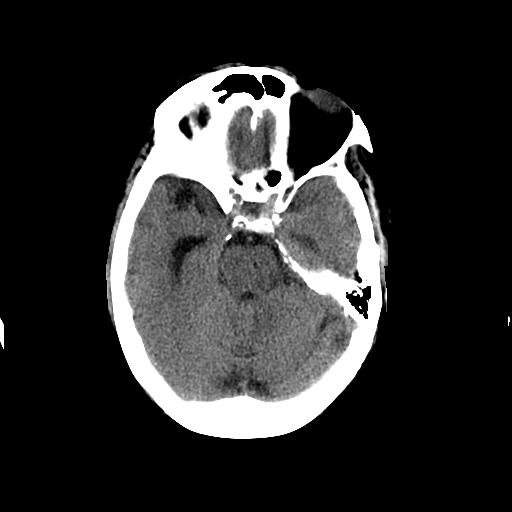
[im 11/30  brain]
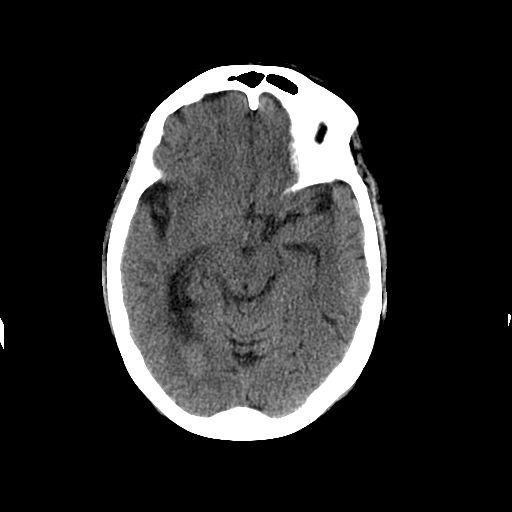
[im 11/30  bone]
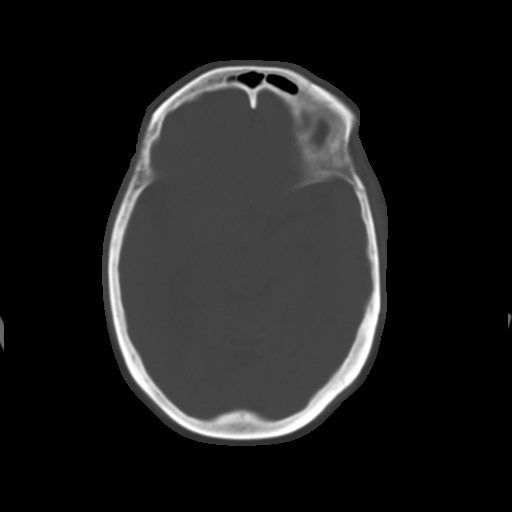
[im 13/30  brain]
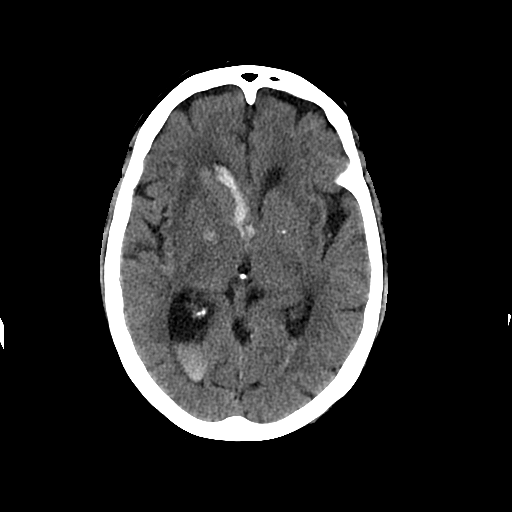
[im 15/30  brain]
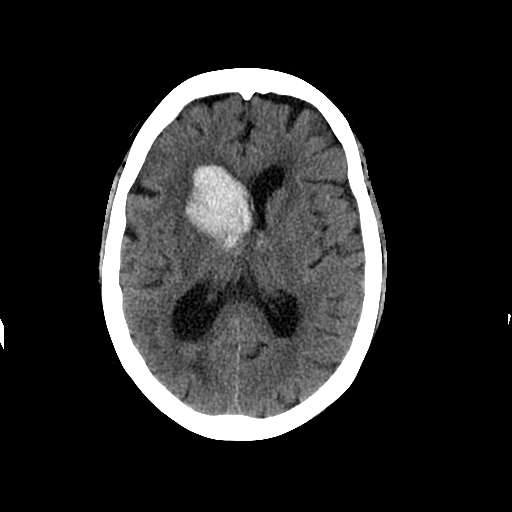
[im 17/30  brain]
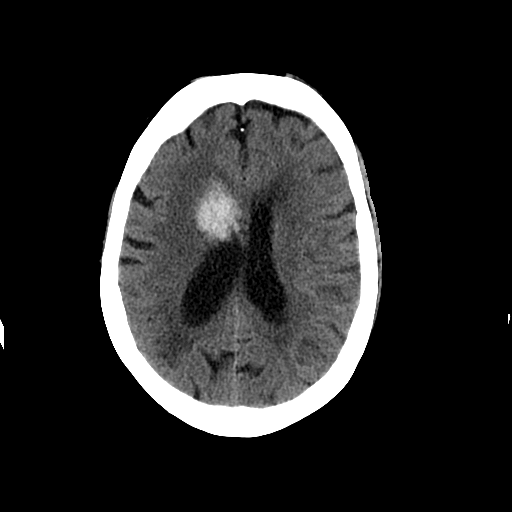
[im 19/30  brain]
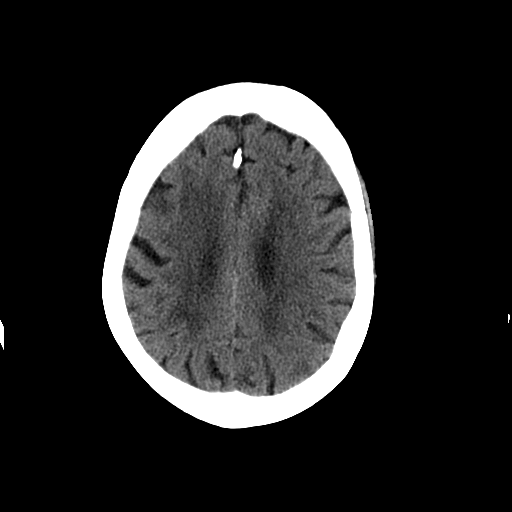
[im 19/30  bone]
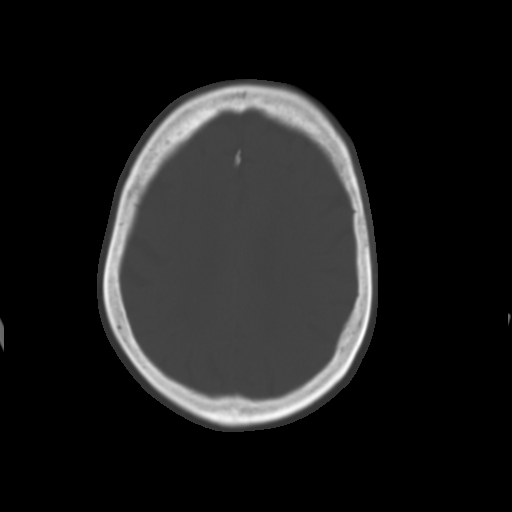
[im 21/30  brain]
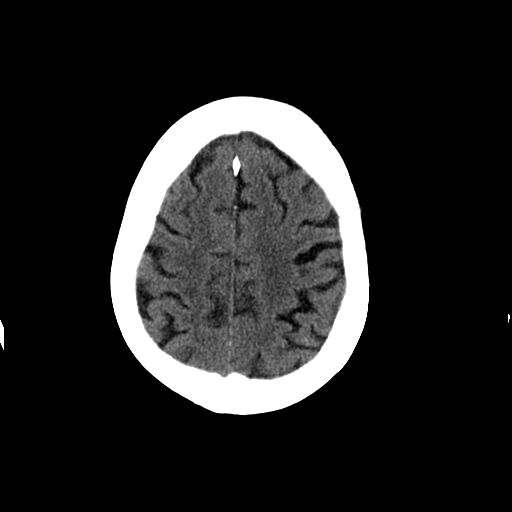
[im 23/30  brain]
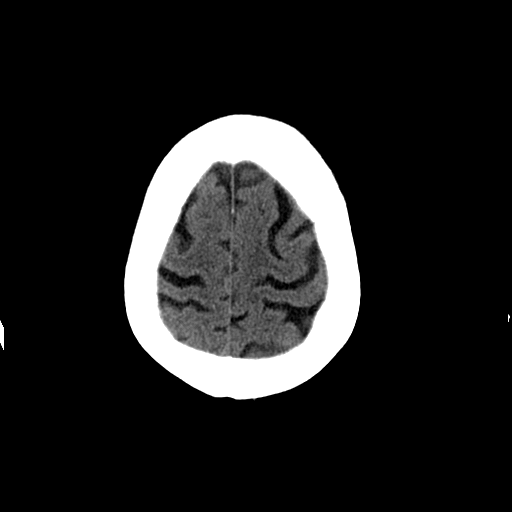
[im 25/30  brain]
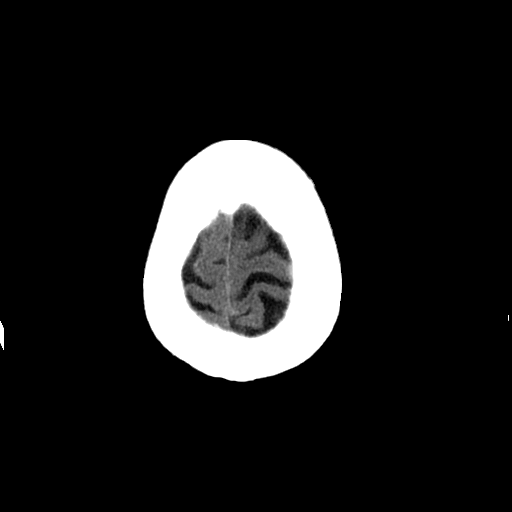
[im 27/30  brain]
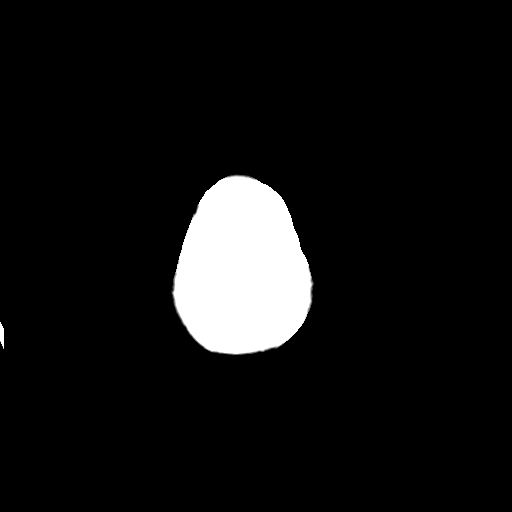
[im 27/30  bone]
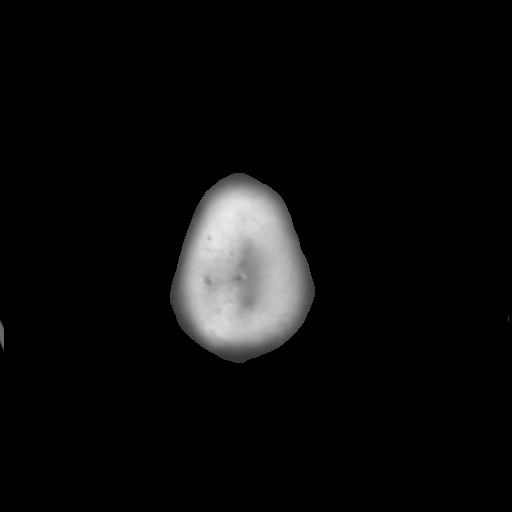

[Series 202: head w/o bone, idose (1) · axial · non-contrast · 0.49mm/px · z∈[+82,+102]mm · 2 of 30 slices shown]
[im 3/30  bone]
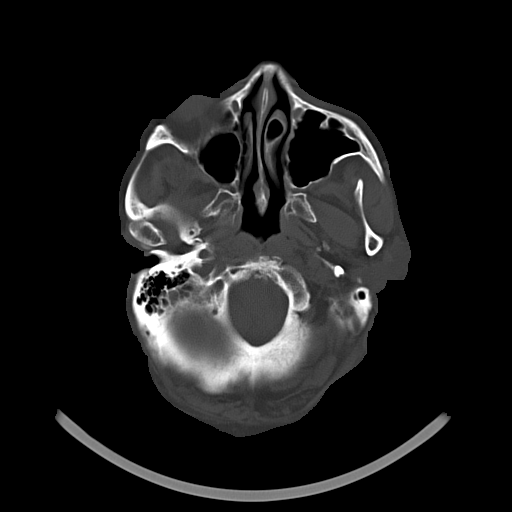
[im 7/30  bone]
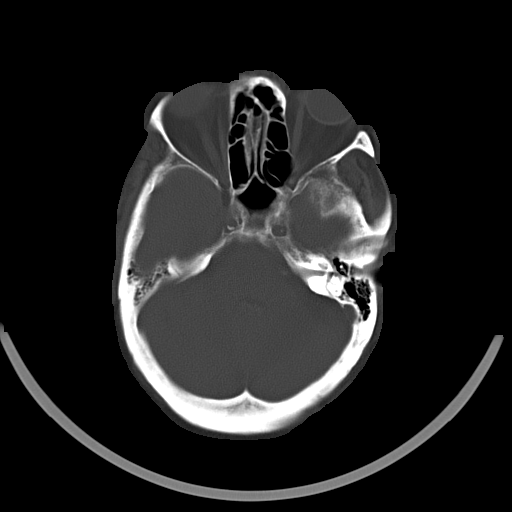

[15 of 30 positions shown; findings below may reference images not displayed]

FINDINGS: Skull and Sinuses:No posttraumatic findings or destructive process.

Sphenoid sinusitis with chronic type mucosal thickening.

Orbits: Cataract resections.  No acute finding.

Brain: No increase in a parenchymal hematoma centered in the right
caudate head, 4 cm in maximal dimension. Better seen or slightly
increased intraventricular blood layering in the occipital horns,
right greater than left. Stable to decreased third ventricular
hematoma. Lateral ventriculomegaly with greater dilatation on the
right, including the temporal horn, compatible with least partial
obstruction. Interval clearing of blood within the lower fourth
ventricle, accounting for subtle new subarachnoid hemorrhage seen in
the left and possibly right sylvian fissure (image 14). No visible
infarct. Chronic small vessel disease with patchy ischemic gliosis
throughout the bilateral cerebral white matter.
IMPRESSION: Size stable 
 4 cm right basal ganglia hematoma. Intraventricular
hemorrhage has redistributed, accounting for newly seen trace
subarachnoid blood. Obstructive right lateral ventriculomegaly which
is stable.

## 2017-02-25 DIAGNOSIS — I482 Chronic atrial fibrillation: Secondary | ICD-10-CM | POA: Diagnosis not present

## 2017-02-25 DIAGNOSIS — N182 Chronic kidney disease, stage 2 (mild): Secondary | ICD-10-CM | POA: Diagnosis not present

## 2017-02-25 DIAGNOSIS — I5022 Chronic systolic (congestive) heart failure: Secondary | ICD-10-CM | POA: Diagnosis not present

## 2017-02-25 DIAGNOSIS — Z7982 Long term (current) use of aspirin: Secondary | ICD-10-CM | POA: Diagnosis not present

## 2017-02-25 DIAGNOSIS — E1122 Type 2 diabetes mellitus with diabetic chronic kidney disease: Secondary | ICD-10-CM | POA: Diagnosis not present

## 2017-02-25 DIAGNOSIS — E039 Hypothyroidism, unspecified: Secondary | ICD-10-CM | POA: Diagnosis not present

## 2017-02-25 DIAGNOSIS — I69354 Hemiplegia and hemiparesis following cerebral infarction affecting left non-dominant side: Secondary | ICD-10-CM | POA: Diagnosis not present

## 2017-02-25 DIAGNOSIS — R001 Bradycardia, unspecified: Secondary | ICD-10-CM | POA: Diagnosis not present

## 2017-02-25 DIAGNOSIS — I130 Hypertensive heart and chronic kidney disease with heart failure and stage 1 through stage 4 chronic kidney disease, or unspecified chronic kidney disease: Secondary | ICD-10-CM | POA: Diagnosis not present

## 2017-02-25 DIAGNOSIS — I2699 Other pulmonary embolism without acute cor pulmonale: Secondary | ICD-10-CM | POA: Diagnosis not present

## 2017-02-25 DIAGNOSIS — D631 Anemia in chronic kidney disease: Secondary | ICD-10-CM | POA: Diagnosis not present

## 2017-02-25 IMAGING — CT CT HEAD W/O CM
1 series · 15 of 30 positions shown, 19 images · non-contrast
Comparison: CT head April 05, 2015

CLINICAL DATA: Follow-up intracranial hemorrhage. History of colon
cancer, diabetes, atrial fibrillation.

EXAM:
CT HEAD WITHOUT CONTRAST
TECHNIQUE: Contiguous axial images were obtained from the base of the skull
through the vertex without intravenous contrast.

[Series 2: head 5.0 h30s · axial · 0.44mm/px · z∈[+100,+245]mm · 15 of 33 slices shown, 19 images]
[im 2/33  brain]
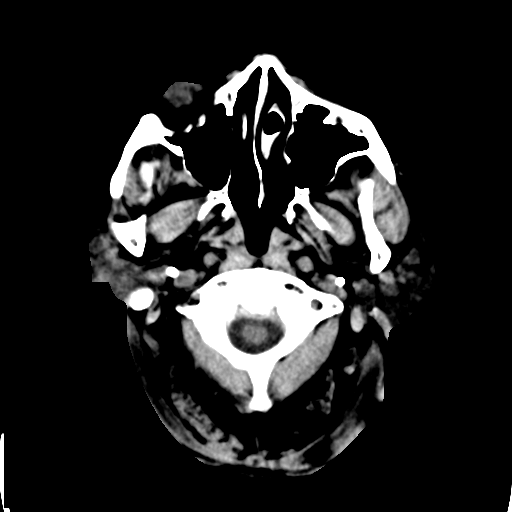
[im 2/33  bone]
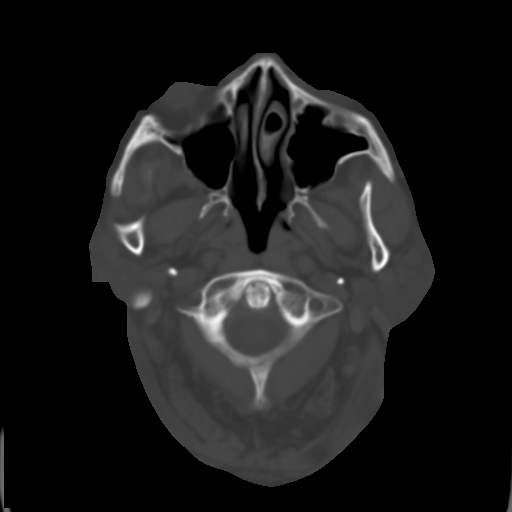
[im 4/33  brain]
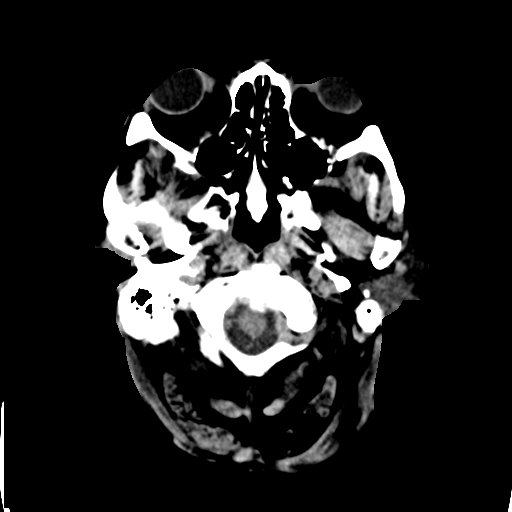
[im 6/33  brain]
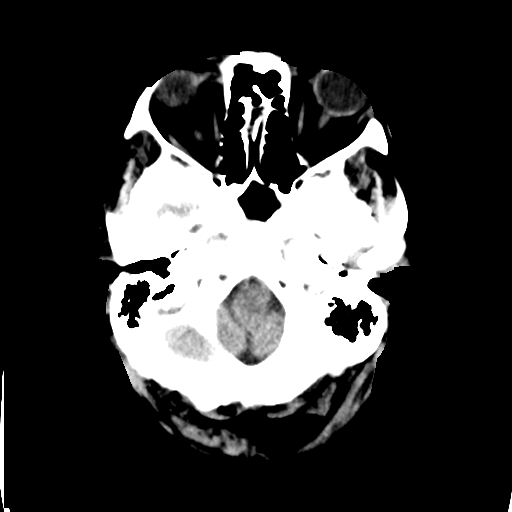
[im 8/33  brain]
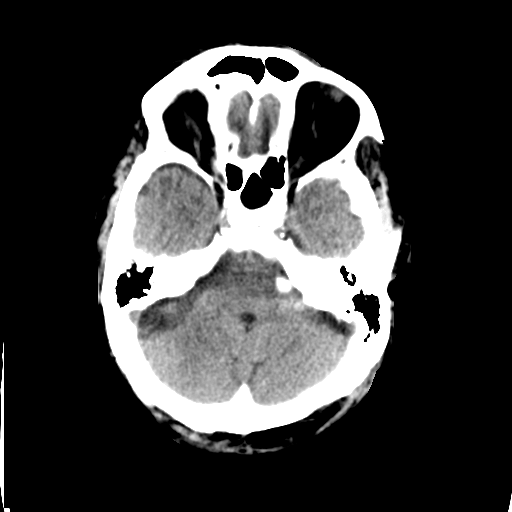
[im 10/33  brain]
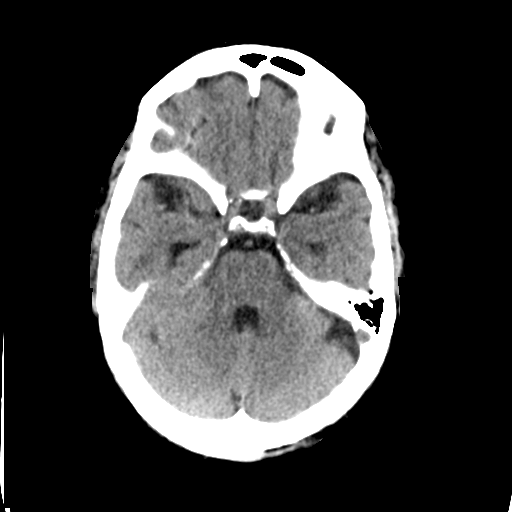
[im 10/33  bone]
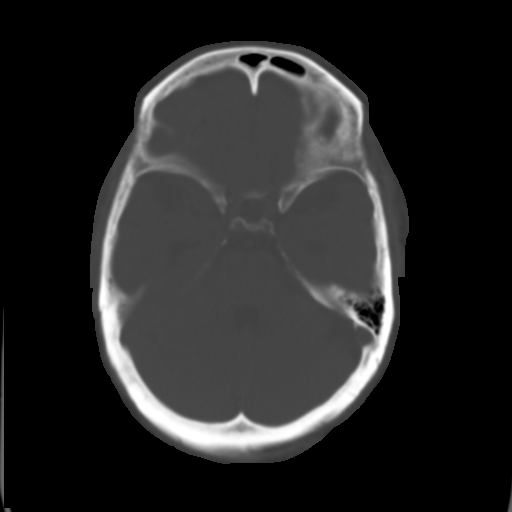
[im 13/33  brain]
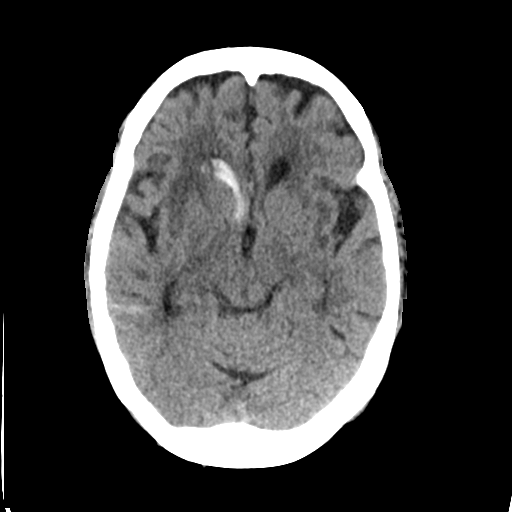
[im 15/33  brain]
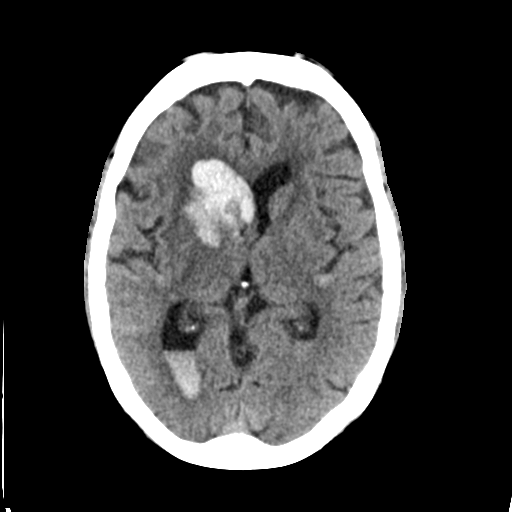
[im 17/33  brain]
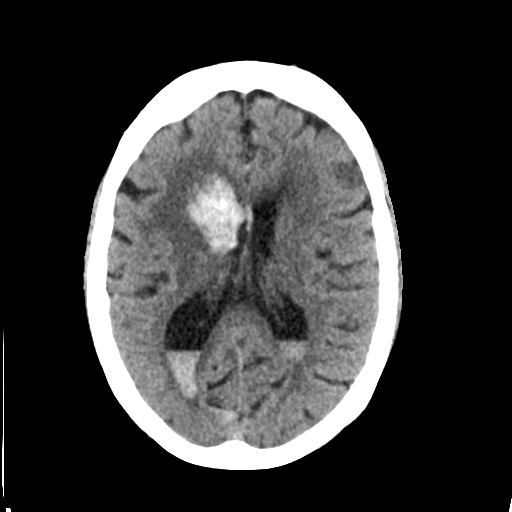
[im 18/33  brain]
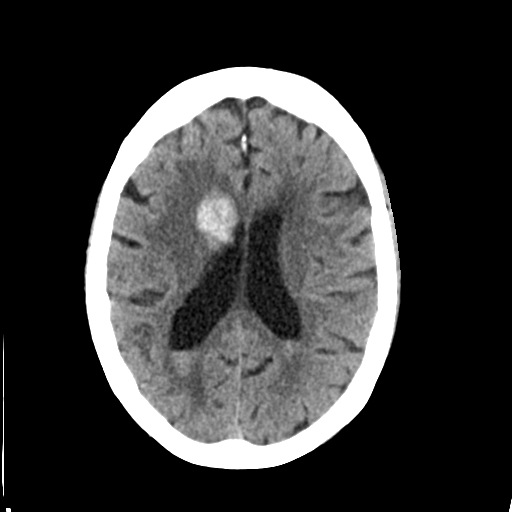
[im 18/33  bone]
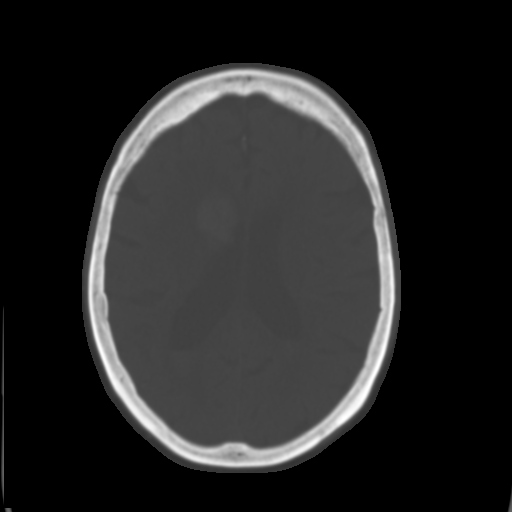
[im 20/33  brain]
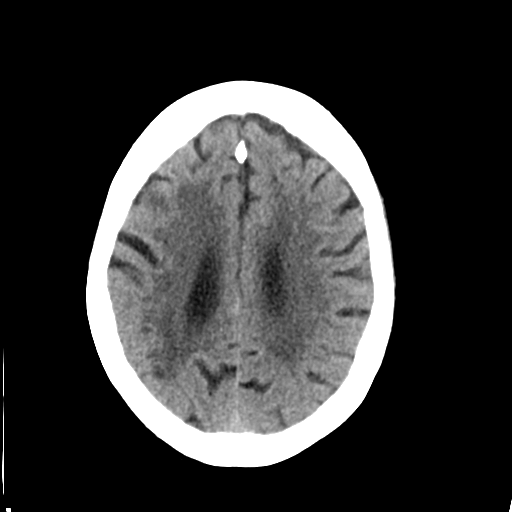
[im 23/33  brain]
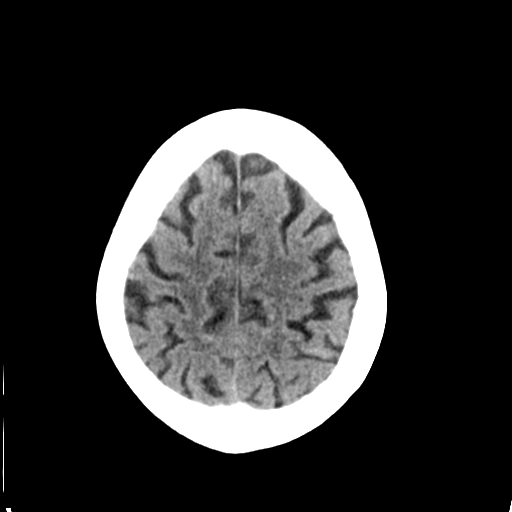
[im 25/33  brain]
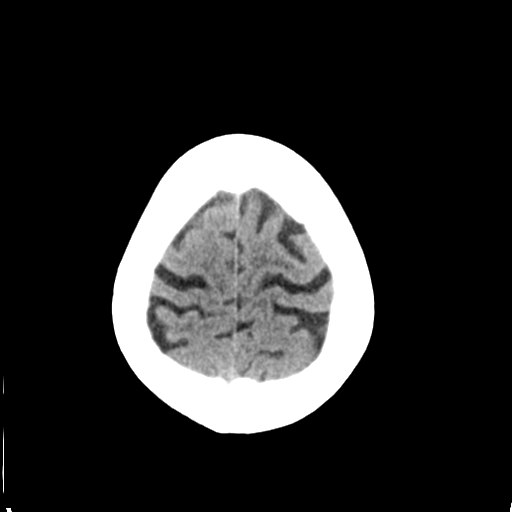
[im 27/33  brain]
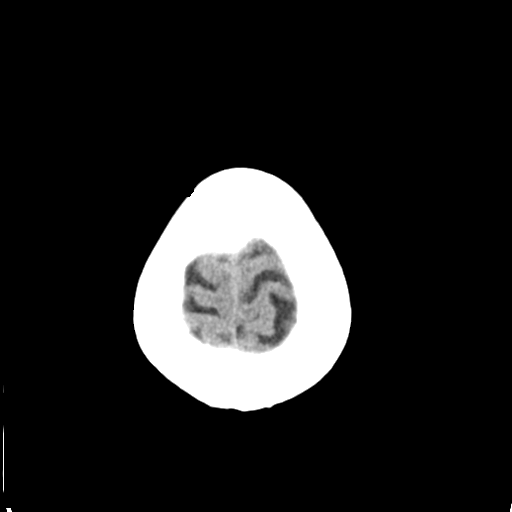
[im 27/33  bone]
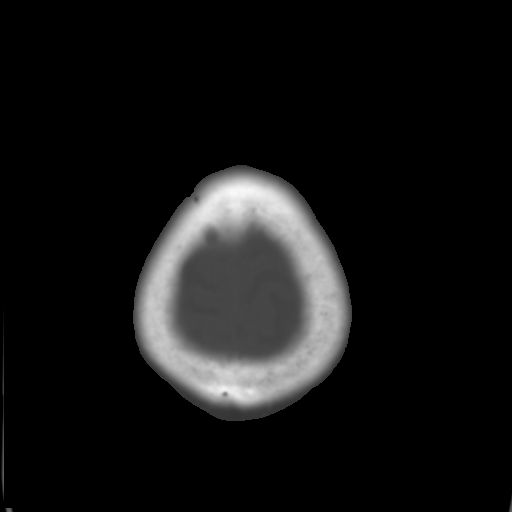
[im 29/33  brain]
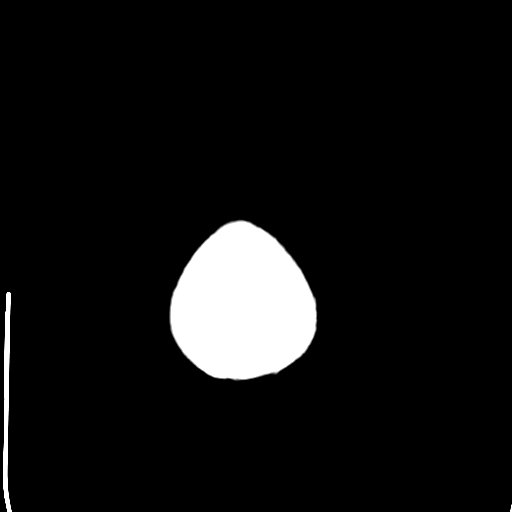
[im 31/33  brain]
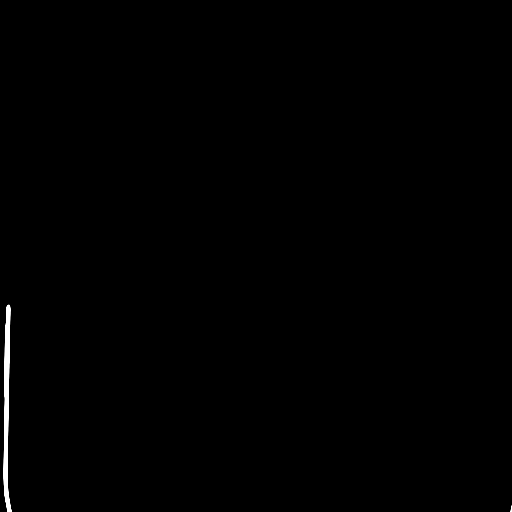

[15 of 30 positions shown; findings below may reference images not displayed]

FINDINGS: Similar appearance of 3.8 x 3 cm RIGHT basal ganglia
intraparenchymal hemorrhage with intraventricular extension,
dependent blood products in the occipital horns. Mass effect on the
third ventricle with mild RIGHT ventricular entrapment. Small amount
of probable redistributed subarachnoid blood within the RIGHT
temporal occipital sulci, sylvian fissures. No acute large vascular
territory infarcts. Vasogenic edema RIGHT frontal lobe, in a
background of at least moderate white matter changes compatible with
chronic small vessel ischemic disease. Moderate calcific
atherosclerosis of the carotid siphons.

Basal cisterns are patent. Status post bilateral ocular lens
implants. Lobulated mucosal thickening sphenoid sinuses. No skull
fracture. Severe atlantodental osteoarthrosis partially
characterized.
IMPRESSION: Evolving RIGHT basal ganglia hematoma with intraventricular
extension. Mild RIGHT ventricular entrapment.

Increasing small to moderate amount of subarachnoid blood products
are likely re- distributed.

## 2017-02-26 DIAGNOSIS — I69354 Hemiplegia and hemiparesis following cerebral infarction affecting left non-dominant side: Secondary | ICD-10-CM | POA: Diagnosis not present

## 2017-02-26 DIAGNOSIS — E039 Hypothyroidism, unspecified: Secondary | ICD-10-CM | POA: Diagnosis not present

## 2017-02-26 DIAGNOSIS — E1122 Type 2 diabetes mellitus with diabetic chronic kidney disease: Secondary | ICD-10-CM | POA: Diagnosis not present

## 2017-02-26 DIAGNOSIS — I2699 Other pulmonary embolism without acute cor pulmonale: Secondary | ICD-10-CM | POA: Diagnosis not present

## 2017-02-26 DIAGNOSIS — Z7982 Long term (current) use of aspirin: Secondary | ICD-10-CM | POA: Diagnosis not present

## 2017-02-26 DIAGNOSIS — R001 Bradycardia, unspecified: Secondary | ICD-10-CM | POA: Diagnosis not present

## 2017-02-26 DIAGNOSIS — I5022 Chronic systolic (congestive) heart failure: Secondary | ICD-10-CM | POA: Diagnosis not present

## 2017-02-26 DIAGNOSIS — I482 Chronic atrial fibrillation: Secondary | ICD-10-CM | POA: Diagnosis not present

## 2017-02-26 DIAGNOSIS — D631 Anemia in chronic kidney disease: Secondary | ICD-10-CM | POA: Diagnosis not present

## 2017-02-26 DIAGNOSIS — I130 Hypertensive heart and chronic kidney disease with heart failure and stage 1 through stage 4 chronic kidney disease, or unspecified chronic kidney disease: Secondary | ICD-10-CM | POA: Diagnosis not present

## 2017-02-26 DIAGNOSIS — N182 Chronic kidney disease, stage 2 (mild): Secondary | ICD-10-CM | POA: Diagnosis not present

## 2017-02-26 IMAGING — CT CT HEAD W/O CM
1 of 2 series · 15 of 30 positions shown, 19 images · non-contrast
Comparison: Yesterday

CLINICAL DATA: Neuro changes and lethargy. Elevated blood pressure.

EXAM:
CT HEAD WITHOUT CONTRAST
TECHNIQUE: Contiguous axial images were obtained from the base of the skull
through the vertex without intravenous contrast.

[Series 2: head 5.0 h30s · axial · 0.44mm/px · z∈[+1618,+1748]mm · 15 of 30 slices shown, 19 images]
[im 2/30  brain]
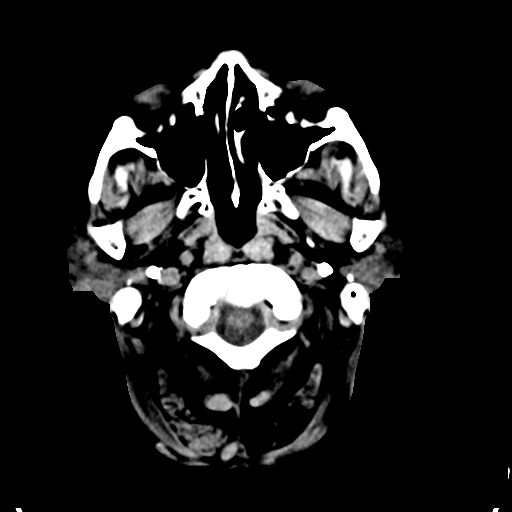
[im 2/30  bone]
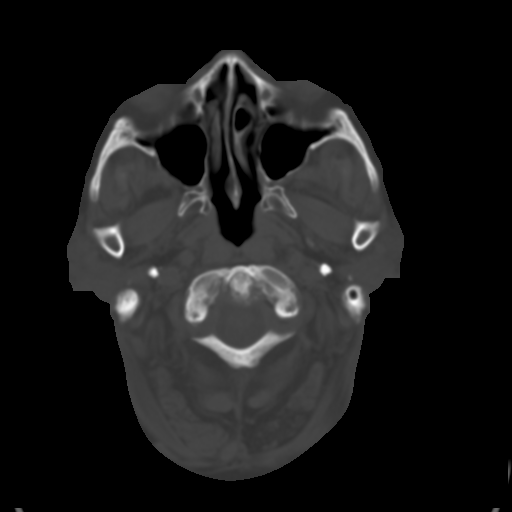
[im 3/30  brain]
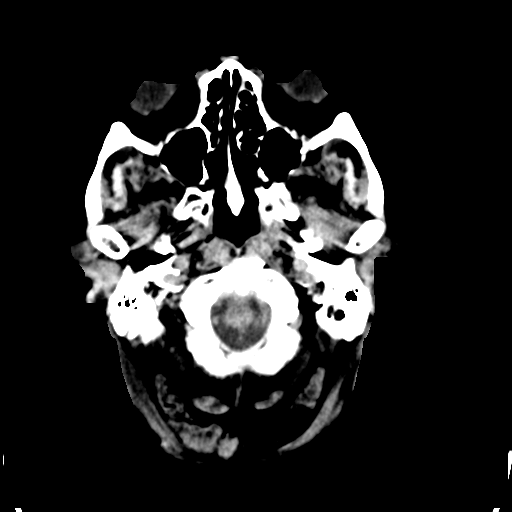
[im 6/30  brain]
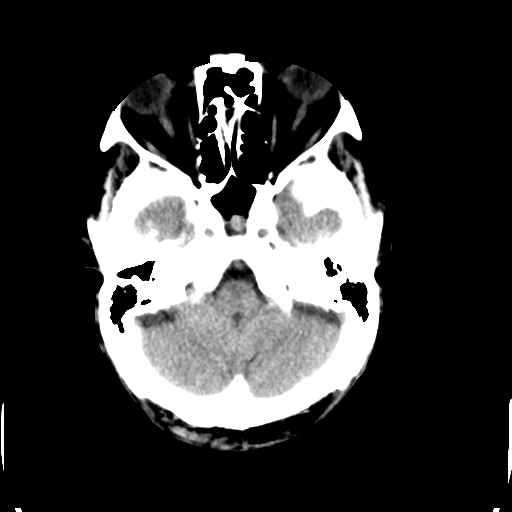
[im 8/30  brain]
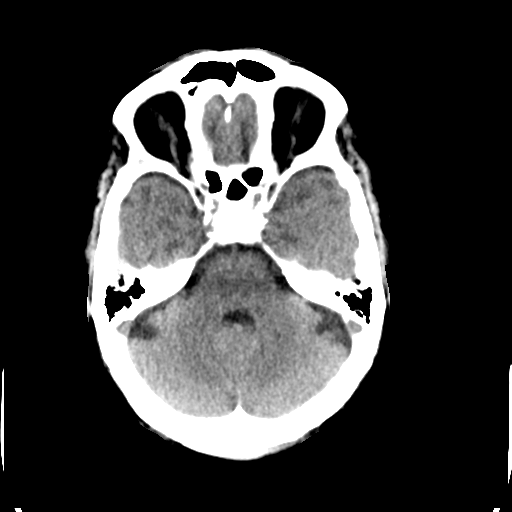
[im 9/30  brain]
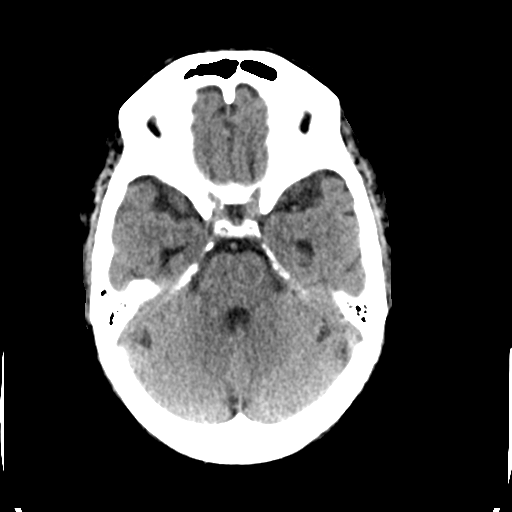
[im 9/30  bone]
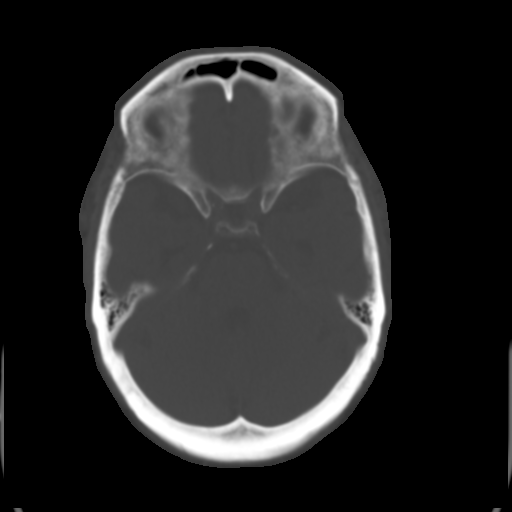
[im 11/30  brain]
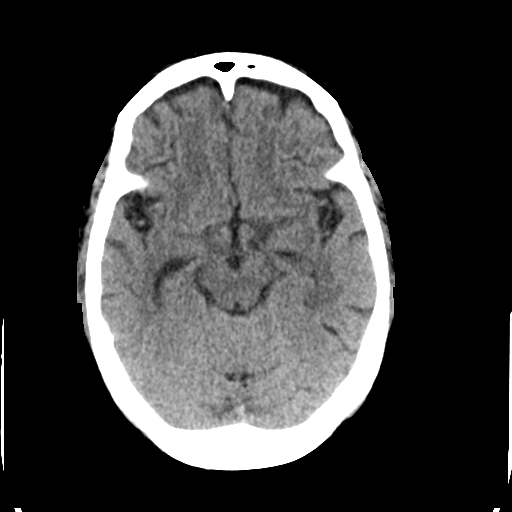
[im 14/30  brain]
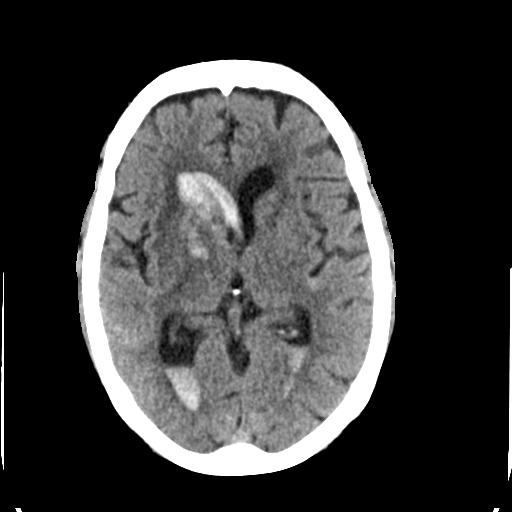
[im 15/30  brain]
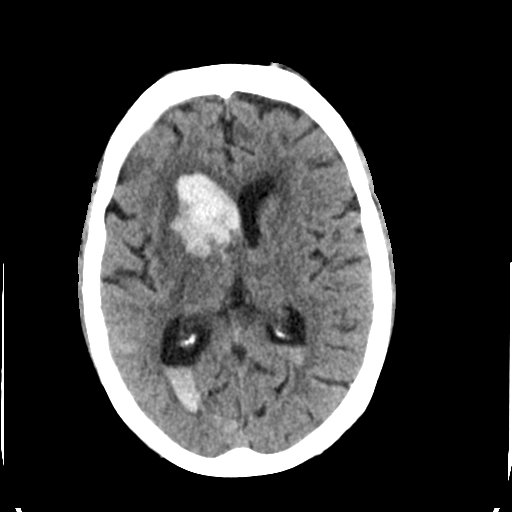
[im 16/30  brain]
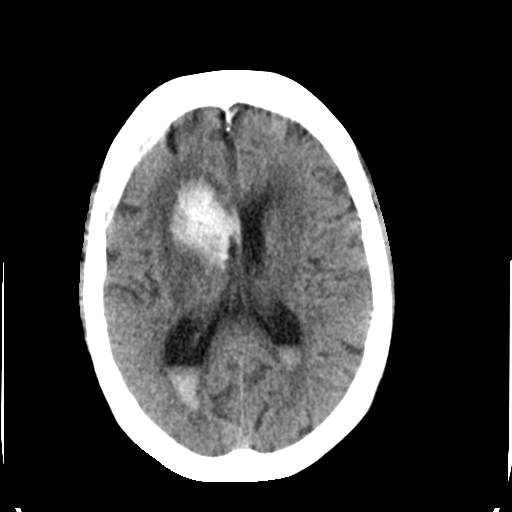
[im 16/30  bone]
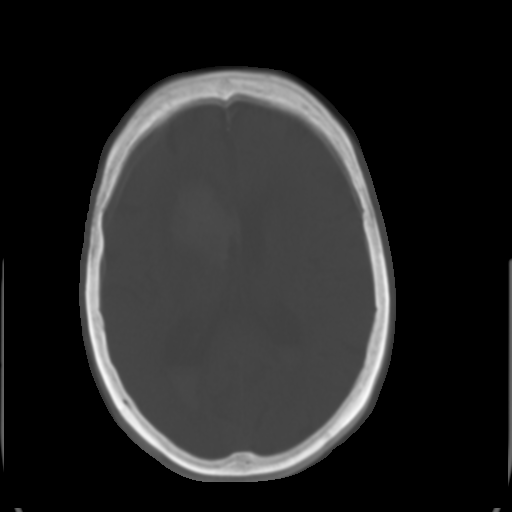
[im 19/30  brain]
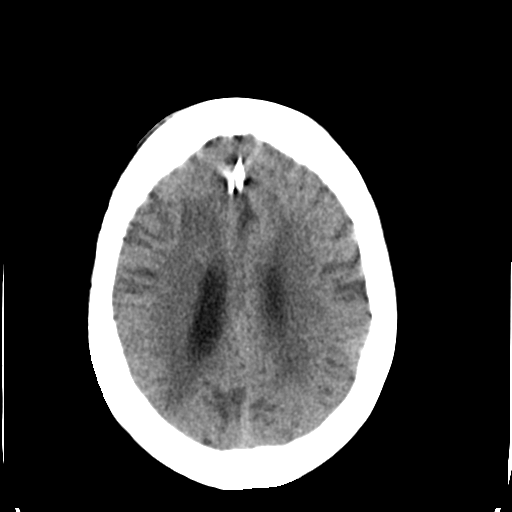
[im 21/30  brain]
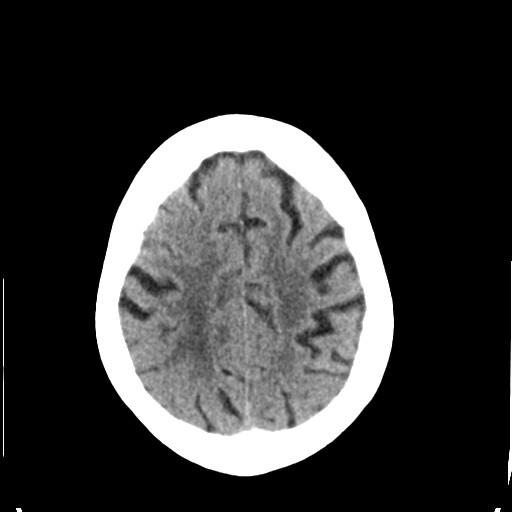
[im 22/30  brain]
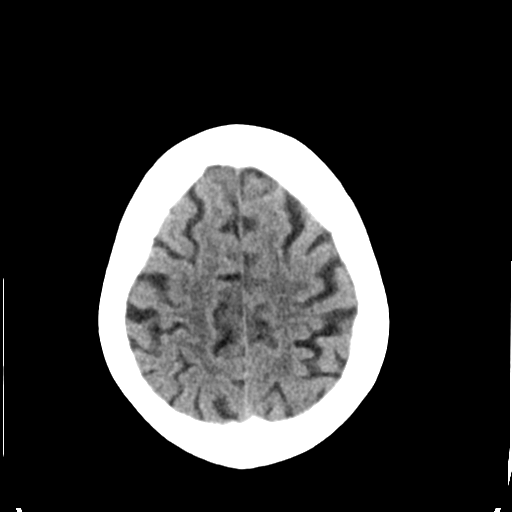
[im 24/30  brain]
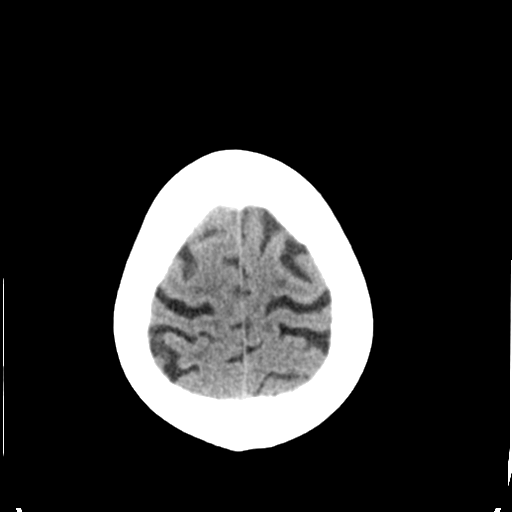
[im 24/30  bone]
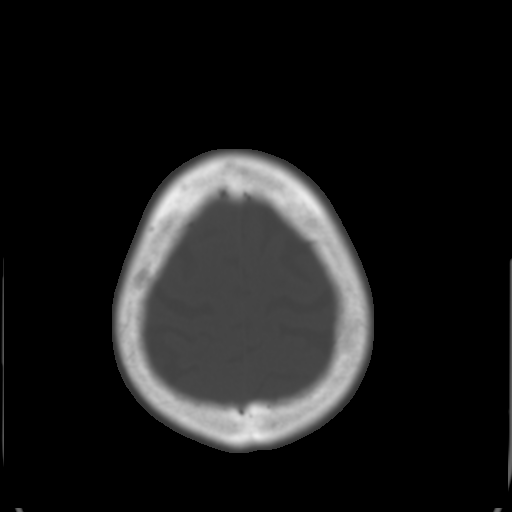
[im 27/30  brain]
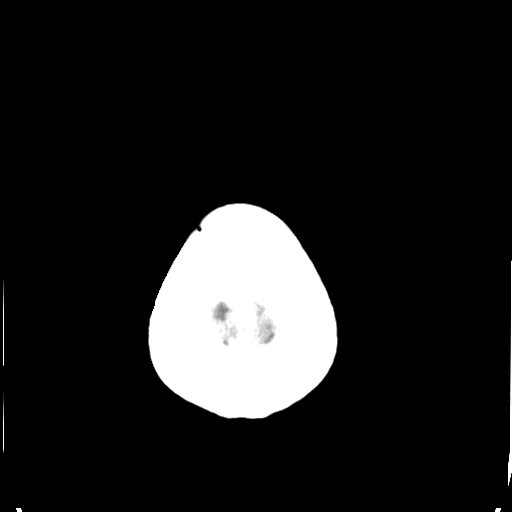
[im 28/30  brain]
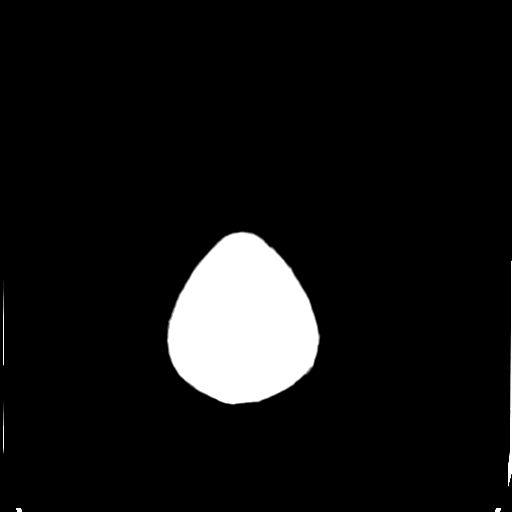

[15 of 30 positions shown; findings below may reference images not displayed]

FINDINGS: Skull and Sinuses:No acute finding.

Chronic sphenoid sinusitis.

Orbits: No acute finding. Gaze to the right. Bilateral cataract
resection.

Brain: Stable hematoma in the right basal ganglia, centered at the
caudate head. Low-attenuation around the hematoma is stable, as is
local midline shift to the left. Stable volume of intraventricular
hemorrhage, layering in the occipital horns of the lateral
ventricles. No progressive ventriculomegaly. Stable small volume
subarachnoid hemorrhage, likely re- distributed blood based on
evolution on previous CT. No new hemorrhage seen. No evidence of
acute infarct. No herniation.

Background of chronic small vessel disease which is extensive.
IMPRESSION: 1. Unchanged right basal ganglia hematoma with intraventricular
extension and small subarachnoid blood.
2. Stable mild ventriculomegaly.

## 2017-02-27 IMAGING — CR DG CHEST 1V PORT
1 series · 1 of 1 positions shown · non-contrast
Comparison: 04/04/2015

CLINICAL DATA: Altered mental status. Basal ganglia hemorrhagic
infarction.

EXAM:
PORTABLE CHEST - 1 VIEW

[AP]
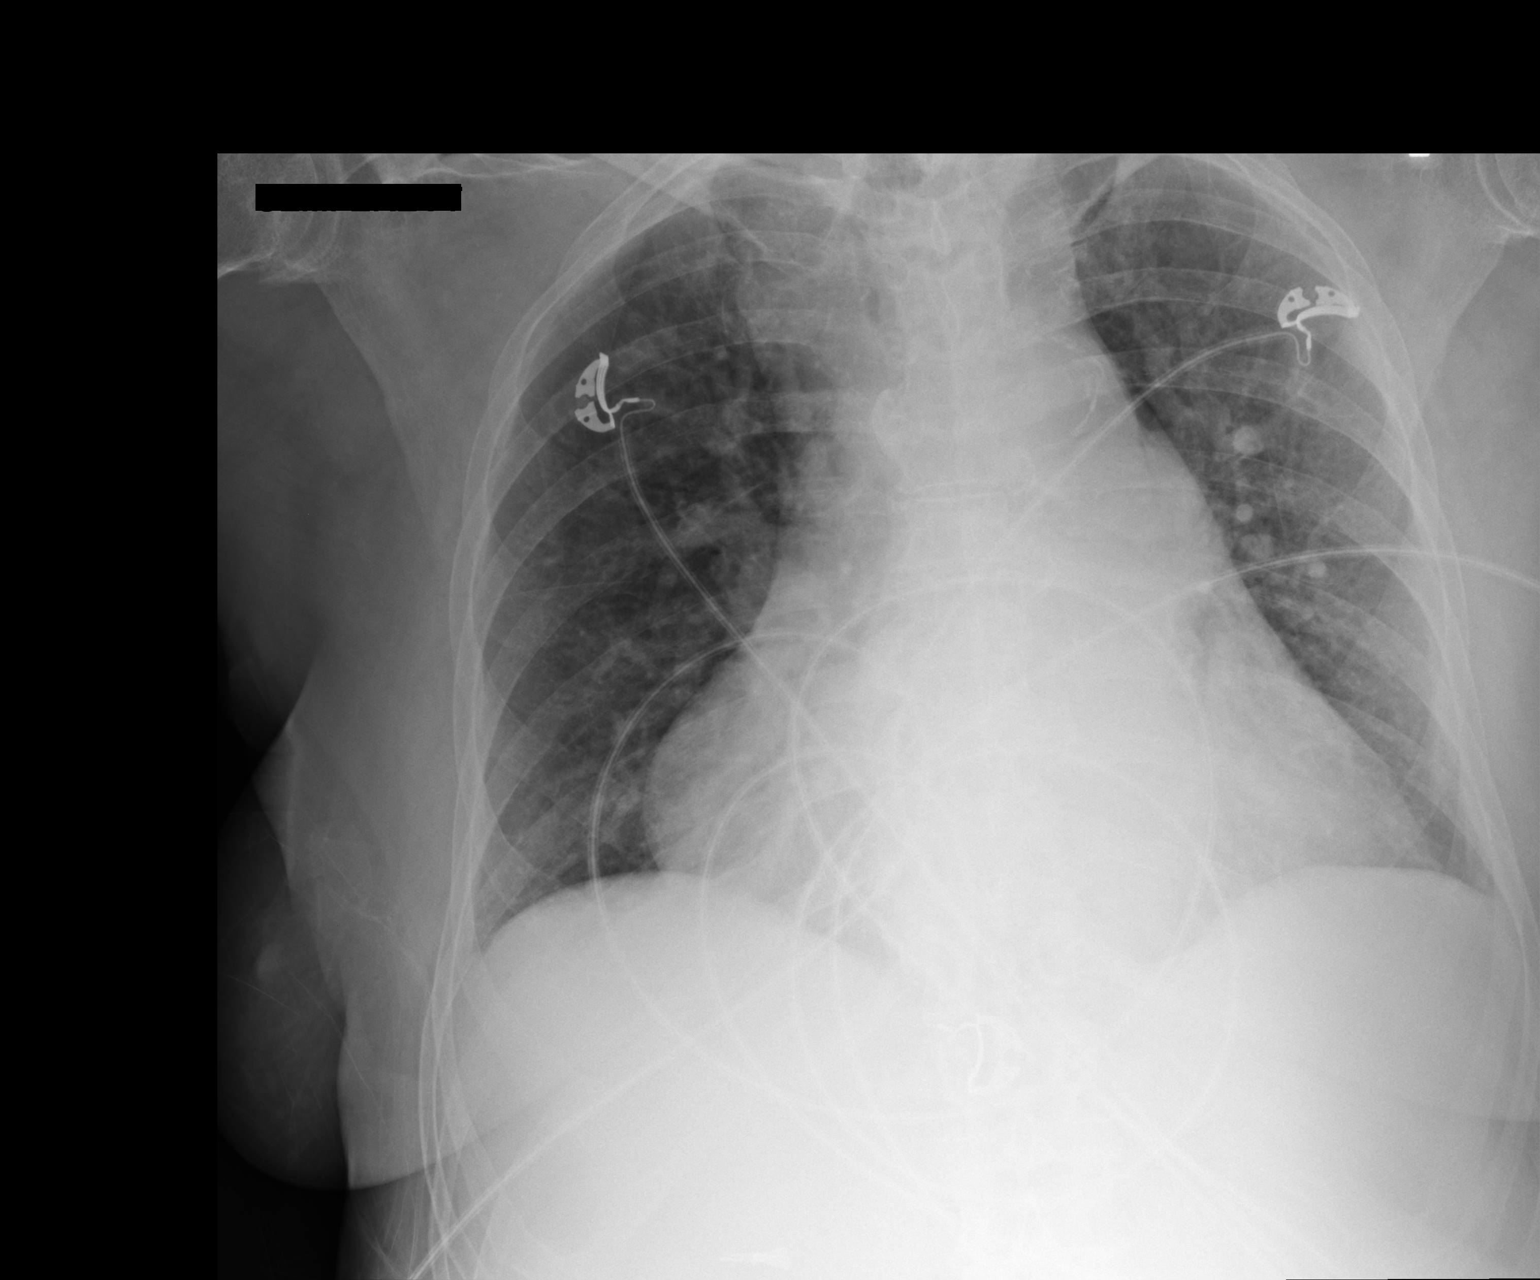

[1 of 1 positions shown; findings below may reference images not displayed]

FINDINGS: The heart is enlarged but stable. The mediastinal and hilar contours
are prominent but unchanged. The lungs are clear of acute process.
Mild central vascular congestion but no edema, infiltrates or
effusions. The bony thorax is grossly intact.
IMPRESSION: Stable cardiac enlargement and prominent mediastinal and hilar
contours.

Mild central vascular congestion but no edema, infiltrates or
effusions.

## 2017-02-28 ENCOUNTER — Ambulatory Visit: Payer: Medicare Other | Admitting: Nurse Practitioner

## 2017-02-28 DIAGNOSIS — I69354 Hemiplegia and hemiparesis following cerebral infarction affecting left non-dominant side: Secondary | ICD-10-CM | POA: Diagnosis not present

## 2017-02-28 DIAGNOSIS — E039 Hypothyroidism, unspecified: Secondary | ICD-10-CM | POA: Diagnosis not present

## 2017-02-28 DIAGNOSIS — R001 Bradycardia, unspecified: Secondary | ICD-10-CM | POA: Diagnosis not present

## 2017-02-28 DIAGNOSIS — N182 Chronic kidney disease, stage 2 (mild): Secondary | ICD-10-CM | POA: Diagnosis not present

## 2017-02-28 DIAGNOSIS — I482 Chronic atrial fibrillation: Secondary | ICD-10-CM | POA: Diagnosis not present

## 2017-02-28 DIAGNOSIS — E1122 Type 2 diabetes mellitus with diabetic chronic kidney disease: Secondary | ICD-10-CM | POA: Diagnosis not present

## 2017-02-28 DIAGNOSIS — Z7982 Long term (current) use of aspirin: Secondary | ICD-10-CM | POA: Diagnosis not present

## 2017-02-28 DIAGNOSIS — I5022 Chronic systolic (congestive) heart failure: Secondary | ICD-10-CM | POA: Diagnosis not present

## 2017-02-28 DIAGNOSIS — D631 Anemia in chronic kidney disease: Secondary | ICD-10-CM | POA: Diagnosis not present

## 2017-02-28 DIAGNOSIS — I2699 Other pulmonary embolism without acute cor pulmonale: Secondary | ICD-10-CM | POA: Diagnosis not present

## 2017-02-28 DIAGNOSIS — I130 Hypertensive heart and chronic kidney disease with heart failure and stage 1 through stage 4 chronic kidney disease, or unspecified chronic kidney disease: Secondary | ICD-10-CM | POA: Diagnosis not present

## 2017-02-28 IMAGING — CR DG ABD PORTABLE 1V
1 series · 1 of 1 positions shown · non-contrast
Comparison: 04/04/2014

CLINICAL DATA: Confirm feeding tube placement

EXAM:
PORTABLE ABDOMEN - 1 VIEW

[AP]
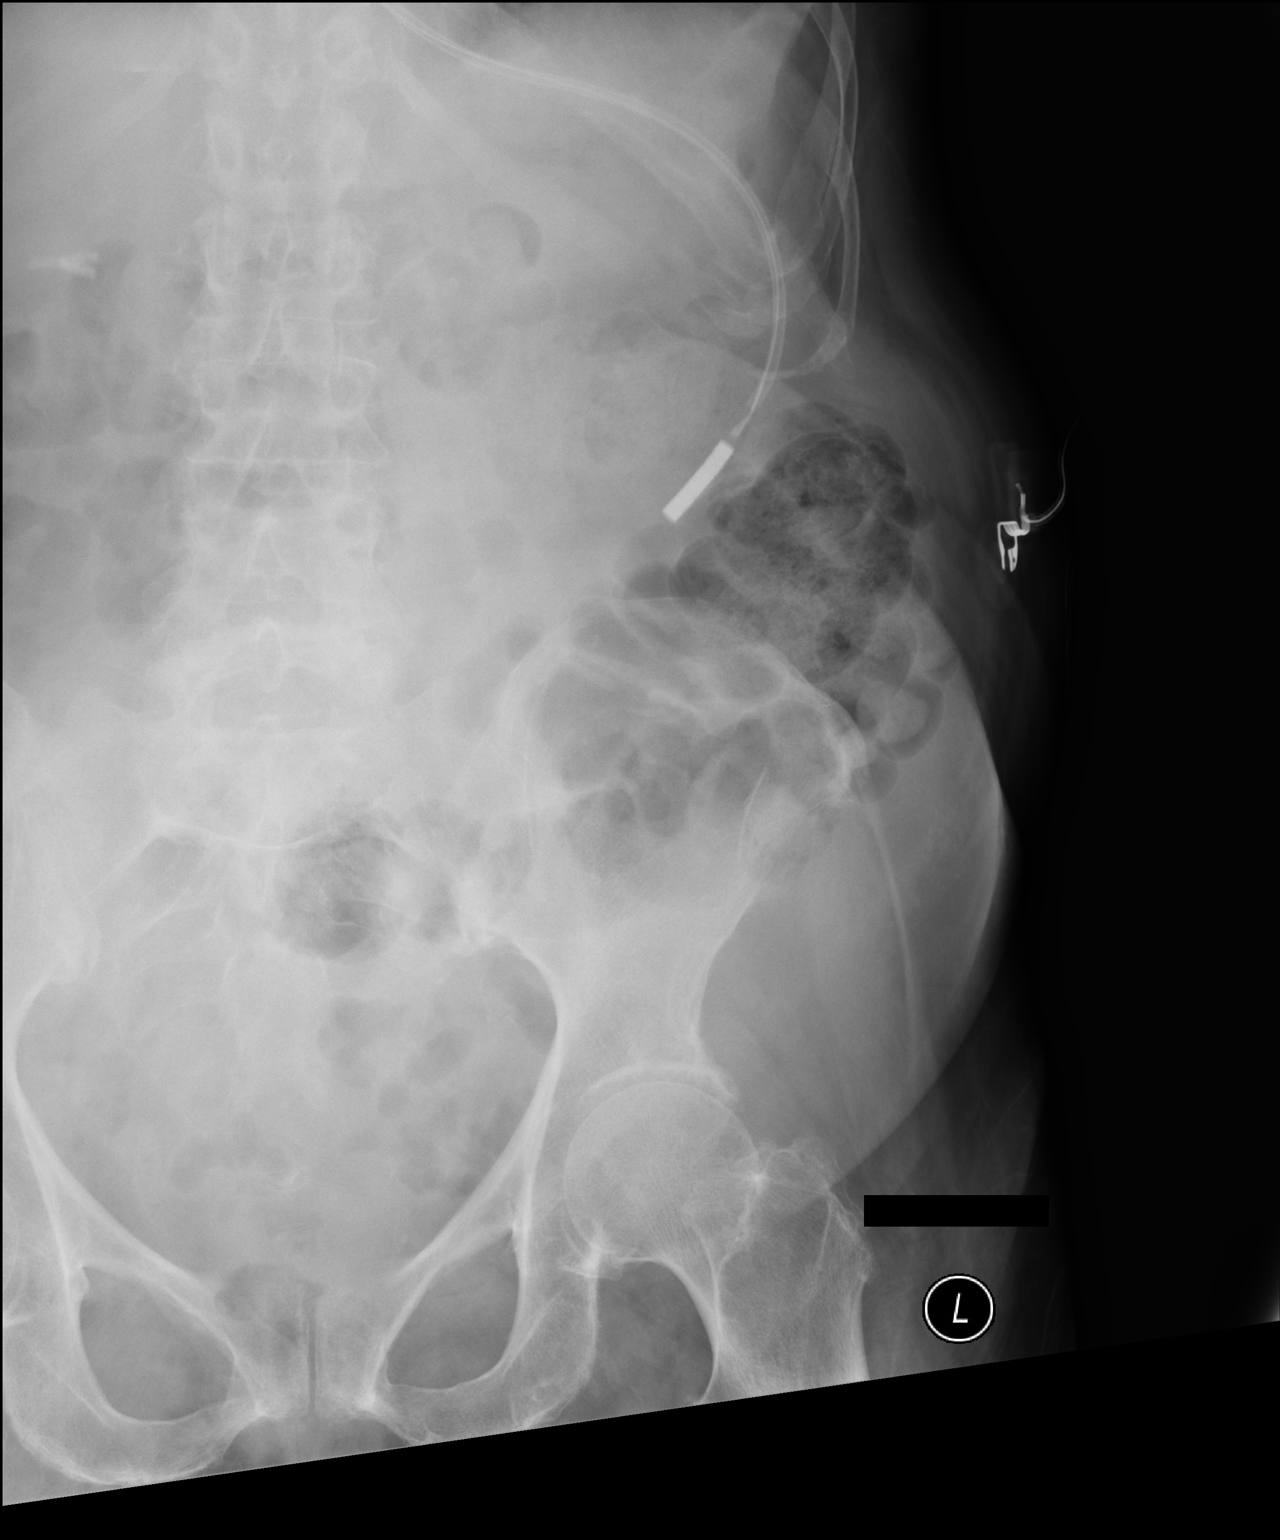

[1 of 1 positions shown; findings below may reference images not displayed]

FINDINGS: Dobbhoff feeding tube projects over the left upper quadrant.
IMPRESSION: Dobbhoff feeding tube over the anticipated position of the stomach.

## 2017-03-01 DIAGNOSIS — I2699 Other pulmonary embolism without acute cor pulmonale: Secondary | ICD-10-CM | POA: Diagnosis not present

## 2017-03-01 DIAGNOSIS — I482 Chronic atrial fibrillation: Secondary | ICD-10-CM | POA: Diagnosis not present

## 2017-03-01 DIAGNOSIS — I130 Hypertensive heart and chronic kidney disease with heart failure and stage 1 through stage 4 chronic kidney disease, or unspecified chronic kidney disease: Secondary | ICD-10-CM | POA: Diagnosis not present

## 2017-03-01 DIAGNOSIS — E039 Hypothyroidism, unspecified: Secondary | ICD-10-CM | POA: Diagnosis not present

## 2017-03-01 DIAGNOSIS — R001 Bradycardia, unspecified: Secondary | ICD-10-CM | POA: Diagnosis not present

## 2017-03-01 DIAGNOSIS — I5022 Chronic systolic (congestive) heart failure: Secondary | ICD-10-CM | POA: Diagnosis not present

## 2017-03-01 DIAGNOSIS — E1122 Type 2 diabetes mellitus with diabetic chronic kidney disease: Secondary | ICD-10-CM | POA: Diagnosis not present

## 2017-03-01 DIAGNOSIS — D631 Anemia in chronic kidney disease: Secondary | ICD-10-CM | POA: Diagnosis not present

## 2017-03-01 DIAGNOSIS — I69354 Hemiplegia and hemiparesis following cerebral infarction affecting left non-dominant side: Secondary | ICD-10-CM | POA: Diagnosis not present

## 2017-03-01 DIAGNOSIS — Z7982 Long term (current) use of aspirin: Secondary | ICD-10-CM | POA: Diagnosis not present

## 2017-03-01 DIAGNOSIS — N182 Chronic kidney disease, stage 2 (mild): Secondary | ICD-10-CM | POA: Diagnosis not present

## 2017-03-04 ENCOUNTER — Ambulatory Visit (INDEPENDENT_AMBULATORY_CARE_PROVIDER_SITE_OTHER): Payer: Medicare Other | Admitting: Nurse Practitioner

## 2017-03-04 ENCOUNTER — Encounter: Payer: Self-pay | Admitting: Nurse Practitioner

## 2017-03-04 VITALS — BP 174/84 | HR 75 | Temp 98.0°F | Resp 17 | Ht 68.0 in | Wt 148.8 lb

## 2017-03-04 DIAGNOSIS — I10 Essential (primary) hypertension: Secondary | ICD-10-CM | POA: Diagnosis not present

## 2017-03-04 DIAGNOSIS — F419 Anxiety disorder, unspecified: Secondary | ICD-10-CM | POA: Diagnosis not present

## 2017-03-04 LAB — BASIC METABOLIC PANEL WITH GFR
BUN: 12 mg/dL (ref 7–25)
CHLORIDE: 106 mmol/L (ref 98–110)
CO2: 24 mmol/L (ref 20–32)
CREATININE: 1.17 mg/dL — AB (ref 0.60–0.88)
Calcium: 8.7 mg/dL (ref 8.6–10.4)
GFR, Est African American: 50 mL/min — ABNORMAL LOW (ref >=60)
GFR, Est Non African American: 43 mL/min — ABNORMAL LOW (ref >=60)
GLUCOSE: 78 mg/dL (ref 65–99)
Potassium: 3.5 mmol/L (ref 3.5–5.3)
Sodium: 143 mmol/L (ref 135–146)

## 2017-03-04 MED ORDER — SERTRALINE HCL 50 MG PO TABS: 50.0000 mg | ORAL_TABLET | Freq: Every day | ORAL | 2 refills | Status: DC

## 2017-03-04 NOTE — Progress Notes (Signed)
Careteam: Patient Care Team: [name], DO as PCP - General (Geriatric Medicine) Neldon Mc, MD as Consulting Physician (General Surgery) Pixie Casino, MD as Consulting Physician (Cardiology) Maia Breslow, MD as Consulting Physician (Orthopedic Surgery) Rutherford Guys, MD as Consulting Physician (Ophthalmology)  Advanced Directive information Type of Advance Directive: Living will  Allergies  Allergen Reactions  . Iohexol Nausea And Vomiting  . Iodinated Diagnostic Agents Nausea And Vomiting  . Z-Pak [Azithromycin]     Reaction unknown by family    Chief Complaint  Patient presents with  . Follow-up    Pt is being seen for a 4 week follow up on anxiety.      HPI: Patient is a 83 y.o. female seen in the office today to follow up on anxiety.  Pt is here alone with husband. She did not remember being placed on anxiety medication but husband reports he puts all of her medication in the pill boxes and states he has seen improvement in her anxiety. No side effects noted.   Hypertension- elevated today but reports caregivers at home always say her blood pressure is fine. Has taken her medication today.   Review of Systems:  Review of Systems  Constitutional: Negative for chills, fever and malaise/fatigue.  HENT: Negative for congestion and hearing loss.   Eyes: Negative for blurred vision.  Respiratory: Negative for cough and shortness of breath.   Cardiovascular: Negative for chest pain, palpitations and leg swelling.  Skin: Negative for itching and rash.  Neurological: Negative for dizziness and weakness.  Endo/Heme/Allergies: Does not bruise/bleed easily.  Psychiatric/Behavioral: Positive for memory loss. Negative for depression. The patient is not nervous/anxious and does not have insomnia.        Oriented to person only     Past Medical History:  Diagnosis Date  . AAA (abdominal aortic aneurysm) (Home)   . Acute upper respiratory infections of  unspecified site   . Anemia   . Anxiety   . Aortic aneurysm of unspecified site without mention of rupture   . Arthritis   . Atrial fibrillation ([place])   . Cervicalgia   . Cholelithiasis   . Chronic systolic heart failure ([place])   . CKD (chronic kidney disease)   . Colon cancer ([place]) 07/1997  . Depression   . Diabetes mellitus ([place])   . Dizziness and giddiness   . Electrolyte and fluid disorders not elsewhere classified   . GERD (gastroesophageal reflux disease)   . Gout, unspecified   . Hemorrhage of rectum and anus   . Hypercholesteremia   . Hypertension   . Hypoglycemia, unspecified   . Hypopotassemia   . Hypothyroidism   . Kidney stone    renal calculi  . Malignant neoplasm of colon, unspecified site   . Other chronic allergic conjunctivitis   . Other specified cardiac dysrhythmias(427.89)   . Pain in joint, lower leg   . Perforation of bile duct   . Proteinuria   . Shortness of breath   . Small bowel obstruction due to adhesions ([place]) 05/16/2012  . Stroke ([place])   . Swelling, mass, or lump in head and neck   . Thoracic aneurysm without mention of rupture    4.6 cm Asc Aortic aneurysm, CT 07/2015   Past Surgical History:  Procedure Laterality Date  . CARDIAC CATHETERIZATION  2008   moderate severe pulm HTN; with elevted pulm capillary wedge pressure   . CHOLECYSTECTOMY  05/14/2012   Procedure: LAPAROSCOPIC CHOLECYSTECTOMY WITH INTRAOPERATIVE CHOLANGIOGRAM;  Surgeon: Haywood Lasso, MD;  Location: [place];  Service: General;  Laterality: N/A;  . COLON SURGERY     to remove colon ca  . ERCP  05/17/2012   Procedure: ENDOSCOPIC RETROGRADE CHOLANGIOPANCREATOGRAPHY (ERCP);  Surgeon: Missy Sabins, MD;  Location: [place];  Service: Gastroenterology;  Laterality: N/A;  . ERCP  08/12/2012   Procedure: ENDOSCOPIC RETROGRADE CHOLANGIOPANCREATOGRAPHY (ERCP);  Surgeon: Missy Sabins, MD;  Location: [facility] ENDOSCOPY;  Service: Endoscopy;  Laterality: N/A;  . HEMICOLECTOMY  08/11/1997    . KIDNEY STONE SURGERY    . TRANSTHORACIC ECHOCARDIOGRAM  11/2009   EF 45-50%, mild-mod AV regurg; mild MR; mod TR; ascending aorta mildly dilated   Social History:   reports that she has never smoked. She has never used smokeless tobacco. She reports that she does not drink alcohol or use drugs.  Family History  Problem Relation Age of Onset  . Heart disease Mother   . Stroke Father   . Hypertension Daughter   . Hypertension Son   . Diabetes Sister   . Hypertension Son     Medications: Patient's Medications  New Prescriptions   No medications on file  Previous Medications   ALLOPURINOL (ZYLOPRIM) 100 MG TABLET    TAKE 1 TABLET BY MOUTH  DAILY   ASSURE COMFORT LANCETS 30G MISC    Use as directed to check blood sugar.   BLOOD GLUCOSE CALIBRATION (OT ULTRA/FASTTK CNTRL SOLN) SOLN    Use as directed.   DILTIAZEM (CARDIZEM CD) 120 MG 24 HR CAPSULE    Take 1 capsule (120 mg total) by mouth daily.   DONEPEZIL (ARICEPT) 10 MG TABLET    Take one tablet by mouth once daily at bedtime   FERROUS SULFATE 325 (65 FE) MG EC TABLET    Take 1 tablet (325 mg total) by mouth 2 (two) times daily with a meal.   FUROSEMIDE (LASIX) 20 MG TABLET    Take 1 tablet (20 mg) by mouth every other day, may also take one additional tablet daily for weight gain greater than 3 lbs.   ISOSORBIDE-HYDRALAZINE (BIDIL) 20-37.5 MG TABLET    Take 2 tablets by mouth 3 (three) times daily.   LABETALOL (NORMODYNE) 200 MG TABLET    Take 1 tablet (200 mg total) by mouth 2 (two) times daily.   LANCET DEVICES (ADJUSTABLE LANCING DEVICE) MISC    Use as directed.   LEVOTHYROXINE (SYNTHROID, LEVOTHROID) 50 MCG TABLET    TAKE 1 TABLET BY MOUTH  DAILY BEFORE BREAKFAST   LISINOPRIL (PRINIVIL,ZESTRIL) 20 MG TABLET    Take 1 tablet (20 mg total) by mouth daily.   ONE TOUCH ULTRA TEST TEST STRIP    Use as directed to check blood sugar.   POTASSIUM CHLORIDE (K-DUR) 10 MEQ TABLET    Take 1 tablet (10 mEq total) by mouth every other day.    SERTRALINE (ZOLOFT) 50 MG TABLET    Take 1 tablet (50 mg total) by mouth daily.  Modified Medications   No medications on file  Discontinued Medications   No medications on file     Physical Exam:  Vitals:   03/04/17 1407  BP: (!) 174/84  Pulse: 75  Resp: 17  Temp: 98 F (36.7 C)  TempSrc: Oral  SpO2: 96%  Weight: 148 lb 12.8 oz (67.5 kg)  Height: 5' 8"  (1.727 m)   Body mass index is 22.62 kg/m.  Physical Exam  Constitutional: She appears well-developed and well-nourished. No distress.  Cardiovascular:  Intact distal pulses.  An irregular rhythm present.  irreg irreg  Pulmonary/Chest: Effort normal and breath sounds normal. No respiratory distress. She has no rales.  Abdominal: Bowel sounds are normal.  Musculoskeletal: Normal range of motion.  Neurological: She is alert.  Oriented to person, poor historian; here with husband   Skin: Skin is warm and dry.  Psychiatric: Her affect is blunt.    Labs reviewed: Basic Metabolic Panel:  Recent Labs  03/14/16 1608  04/11/16 1002  01/11/17 2123 01/12/17 0700  01/14/17 0420 01/15/17 0433 01/31/17 1007  NA 139  < > 139  < >  --  138  < > 142 140 138  K 4.5  < > 3.8  < >  --  3.4*  < > 3.4* 3.7 3.8  CL 106  < > 104  < >  --  103  < > 107 107 103  CO2 23  < > 29  < >  --  23  < > 28 27 27   GLUCOSE 100*  < > 128*  < >  --  136*  < > 114* 123* 247*  BUN 16  < > 15  < >  --  12  < > 9 10 13   CREATININE 1.11*  < > 1.28*  < >  --  1.07*  < > 1.23* 1.13* 1.19*  CALCIUM 9.4  < > 8.3*  < >  --  8.7*  < > 8.8* 8.7* 8.6  MG  --   --  1.4*  --   --  1.6*  --   --   --   --   TSH 2.76  --   --   --  0.244*  --   --   --   --   --   < > = values in this interval not displayed. Liver Function Tests:  Recent Labs  05/24/16 1106 07/20/16 1903 11/07/16 0856  AST 17 17 17   ALT 12 13* 10  ALKPHOS 82 79 107  BILITOT 0.5 0.4 1.0  PROT 7.1 7.2 7.6  ALBUMIN 3.6 3.3* 3.6    Recent Labs  07/20/16 1903  LIPASE 54*     Recent Labs  01/11/17 2123  AMMONIA 24   CBC:  Recent Labs  01/12/17 0700 01/31/17 1007 02/12/17 1053  WBC 7.2 5.1 6.2  NEUTROABS 4.7 3,621 4,650  HGB 13.0 9.8* 10.4*  HCT 39.7 31.1* 33.6*  MCV 70.3* 74.4* 75.2*  PLT 296 389 326   Lipid Panel:  Recent Labs  11/07/16 0856  CHOL 174  HDL 47*  LDLCALC 109*  TRIG 89  CHOLHDL 3.7   TSH:  Recent Labs  03/14/16 1608 01/11/17 2123  TSH 2.76 0.244*   A1C: Lab Results  Component Value Date   HGBA1C 6.8 (H) 01/17/2017     Assessment/Plan 1. Anxiety Stable, pt a poor historian but husband reports anxiety has improved; will cont zoloft  - BMP with eGFR - sertraline (ZOLOFT) 50 MG tablet; Take 1 tablet (50 mg total) by mouth daily.  Dispense: 30 tablet; Refill: 2  2. Hypertension -improved to 146/82 on recheck. Cont current regimen and will monitor.   Next appt: 3 months with Dr Mariea Clonts, sooner if needed  [place] K. Harle Battiest  [facility] [phone] 8 am - 5 pm) [phone] (after hours)

## 2017-03-04 NOTE — Patient Instructions (Signed)
To cont medication

## 2017-03-05 DIAGNOSIS — I69354 Hemiplegia and hemiparesis following cerebral infarction affecting left non-dominant side: Secondary | ICD-10-CM | POA: Diagnosis not present

## 2017-03-05 DIAGNOSIS — D631 Anemia in chronic kidney disease: Secondary | ICD-10-CM | POA: Diagnosis not present

## 2017-03-05 DIAGNOSIS — Z7982 Long term (current) use of aspirin: Secondary | ICD-10-CM | POA: Diagnosis not present

## 2017-03-05 DIAGNOSIS — I5022 Chronic systolic (congestive) heart failure: Secondary | ICD-10-CM | POA: Diagnosis not present

## 2017-03-05 DIAGNOSIS — E039 Hypothyroidism, unspecified: Secondary | ICD-10-CM | POA: Diagnosis not present

## 2017-03-05 DIAGNOSIS — I2699 Other pulmonary embolism without acute cor pulmonale: Secondary | ICD-10-CM | POA: Diagnosis not present

## 2017-03-05 DIAGNOSIS — I130 Hypertensive heart and chronic kidney disease with heart failure and stage 1 through stage 4 chronic kidney disease, or unspecified chronic kidney disease: Secondary | ICD-10-CM | POA: Diagnosis not present

## 2017-03-05 DIAGNOSIS — E1122 Type 2 diabetes mellitus with diabetic chronic kidney disease: Secondary | ICD-10-CM | POA: Diagnosis not present

## 2017-03-05 DIAGNOSIS — R001 Bradycardia, unspecified: Secondary | ICD-10-CM | POA: Diagnosis not present

## 2017-03-05 DIAGNOSIS — I482 Chronic atrial fibrillation: Secondary | ICD-10-CM | POA: Diagnosis not present

## 2017-03-05 DIAGNOSIS — N182 Chronic kidney disease, stage 2 (mild): Secondary | ICD-10-CM | POA: Diagnosis not present

## 2017-03-06 ENCOUNTER — Telehealth: Payer: Self-pay

## 2017-03-06 DIAGNOSIS — D631 Anemia in chronic kidney disease: Secondary | ICD-10-CM | POA: Diagnosis not present

## 2017-03-06 DIAGNOSIS — R001 Bradycardia, unspecified: Secondary | ICD-10-CM | POA: Diagnosis not present

## 2017-03-06 DIAGNOSIS — E039 Hypothyroidism, unspecified: Secondary | ICD-10-CM | POA: Diagnosis not present

## 2017-03-06 DIAGNOSIS — N182 Chronic kidney disease, stage 2 (mild): Secondary | ICD-10-CM | POA: Diagnosis not present

## 2017-03-06 DIAGNOSIS — E1122 Type 2 diabetes mellitus with diabetic chronic kidney disease: Secondary | ICD-10-CM | POA: Diagnosis not present

## 2017-03-06 DIAGNOSIS — I130 Hypertensive heart and chronic kidney disease with heart failure and stage 1 through stage 4 chronic kidney disease, or unspecified chronic kidney disease: Secondary | ICD-10-CM | POA: Diagnosis not present

## 2017-03-06 DIAGNOSIS — I2699 Other pulmonary embolism without acute cor pulmonale: Secondary | ICD-10-CM | POA: Diagnosis not present

## 2017-03-06 DIAGNOSIS — Z7982 Long term (current) use of aspirin: Secondary | ICD-10-CM | POA: Diagnosis not present

## 2017-03-06 DIAGNOSIS — I482 Chronic atrial fibrillation: Secondary | ICD-10-CM | POA: Diagnosis not present

## 2017-03-06 DIAGNOSIS — I69354 Hemiplegia and hemiparesis following cerebral infarction affecting left non-dominant side: Secondary | ICD-10-CM | POA: Diagnosis not present

## 2017-03-06 DIAGNOSIS — I5022 Chronic systolic (congestive) heart failure: Secondary | ICD-10-CM | POA: Diagnosis not present

## 2017-03-06 NOTE — Telephone Encounter (Signed)
[name] called indicating patient did not understand lab results. I called Peter Congo (patient's daughter) to question if information to be given to [place] for DPR not on file.  Peter Congo confirmed that [name] is Toniya friend and helps her out, ok to give information. DPR to be signed at next visit.  I called [name] back at [place] number and discussed labs, Refer to labs dated 03/04/17

## 2017-03-09 IMAGING — DX DG SHOULDER 2+V*L*
3 series · 3 of 3 positions shown · non-contrast
Comparison: None.

CLINICAL DATA: Left shoulder pain after possible fall. Initial
encounter.

EXAM:
LEFT SHOULDER - 2+ VIEW

[t shoulder external left (1 of 3)]
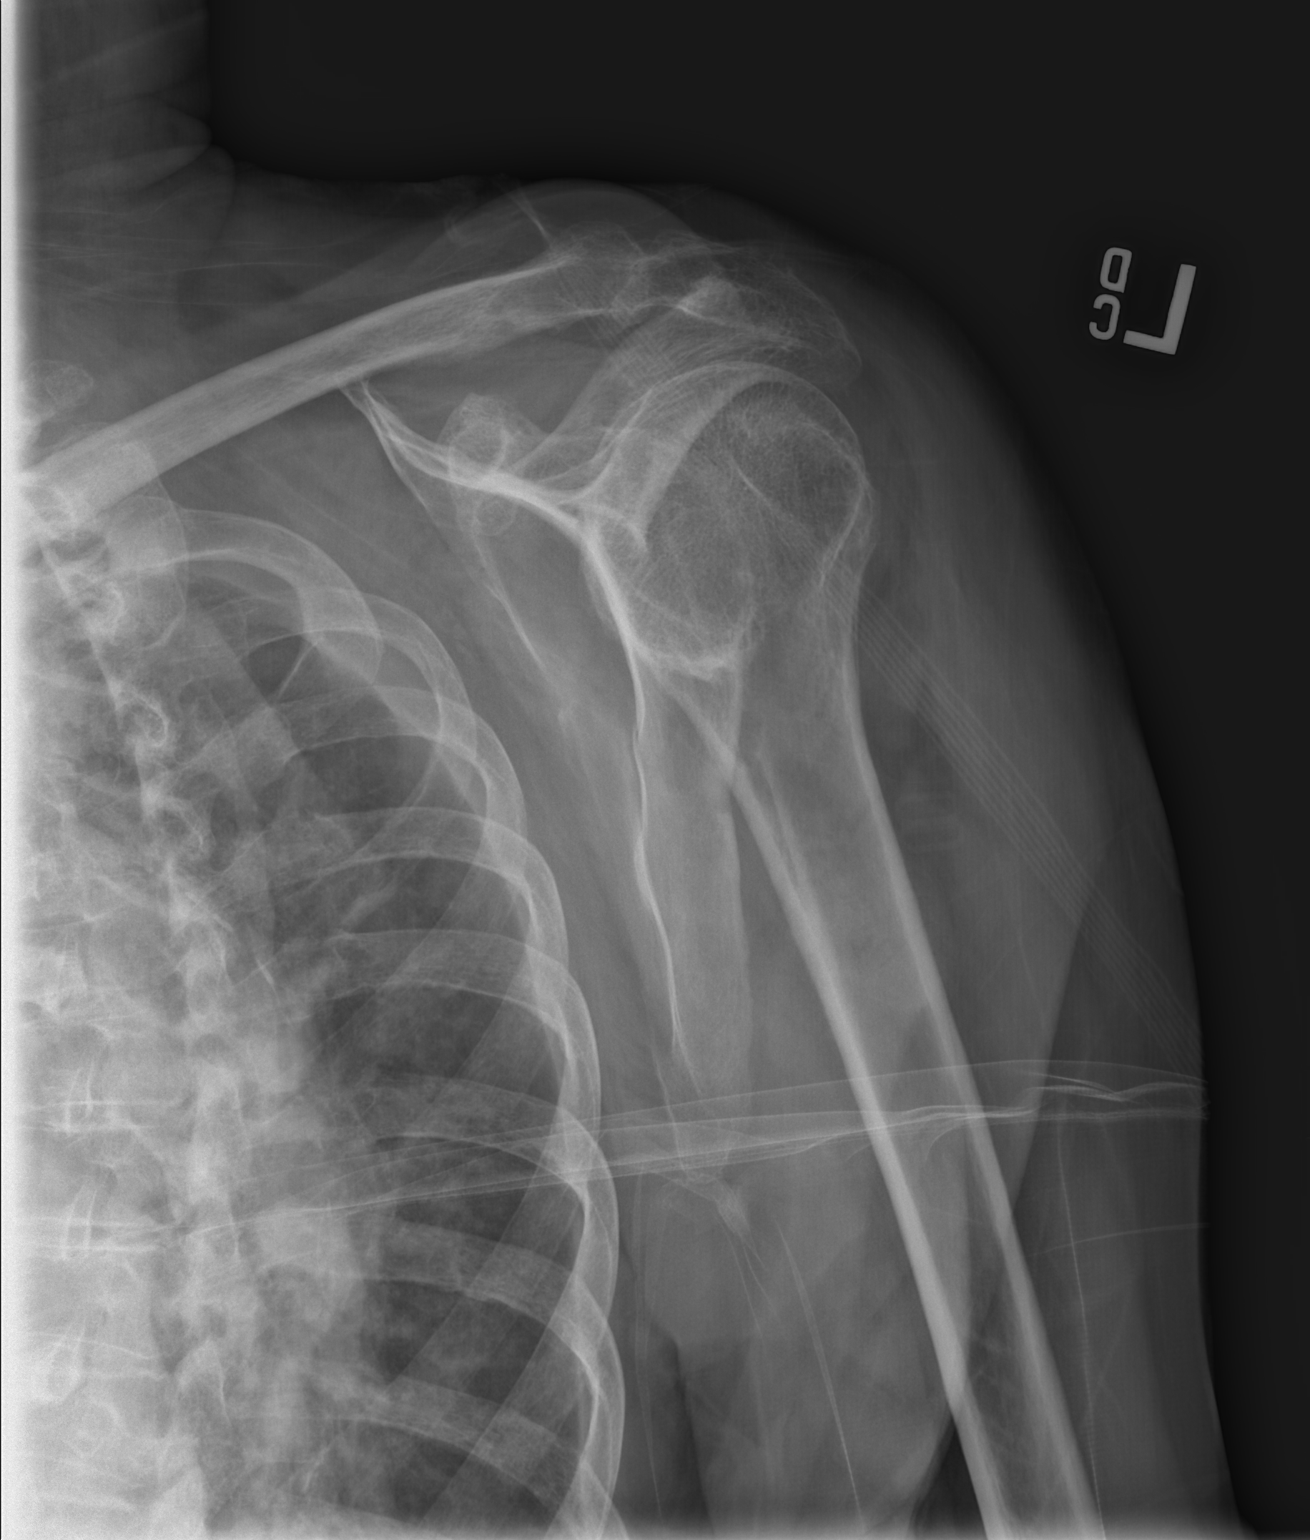

[t shoulder external left (2 of 3)]
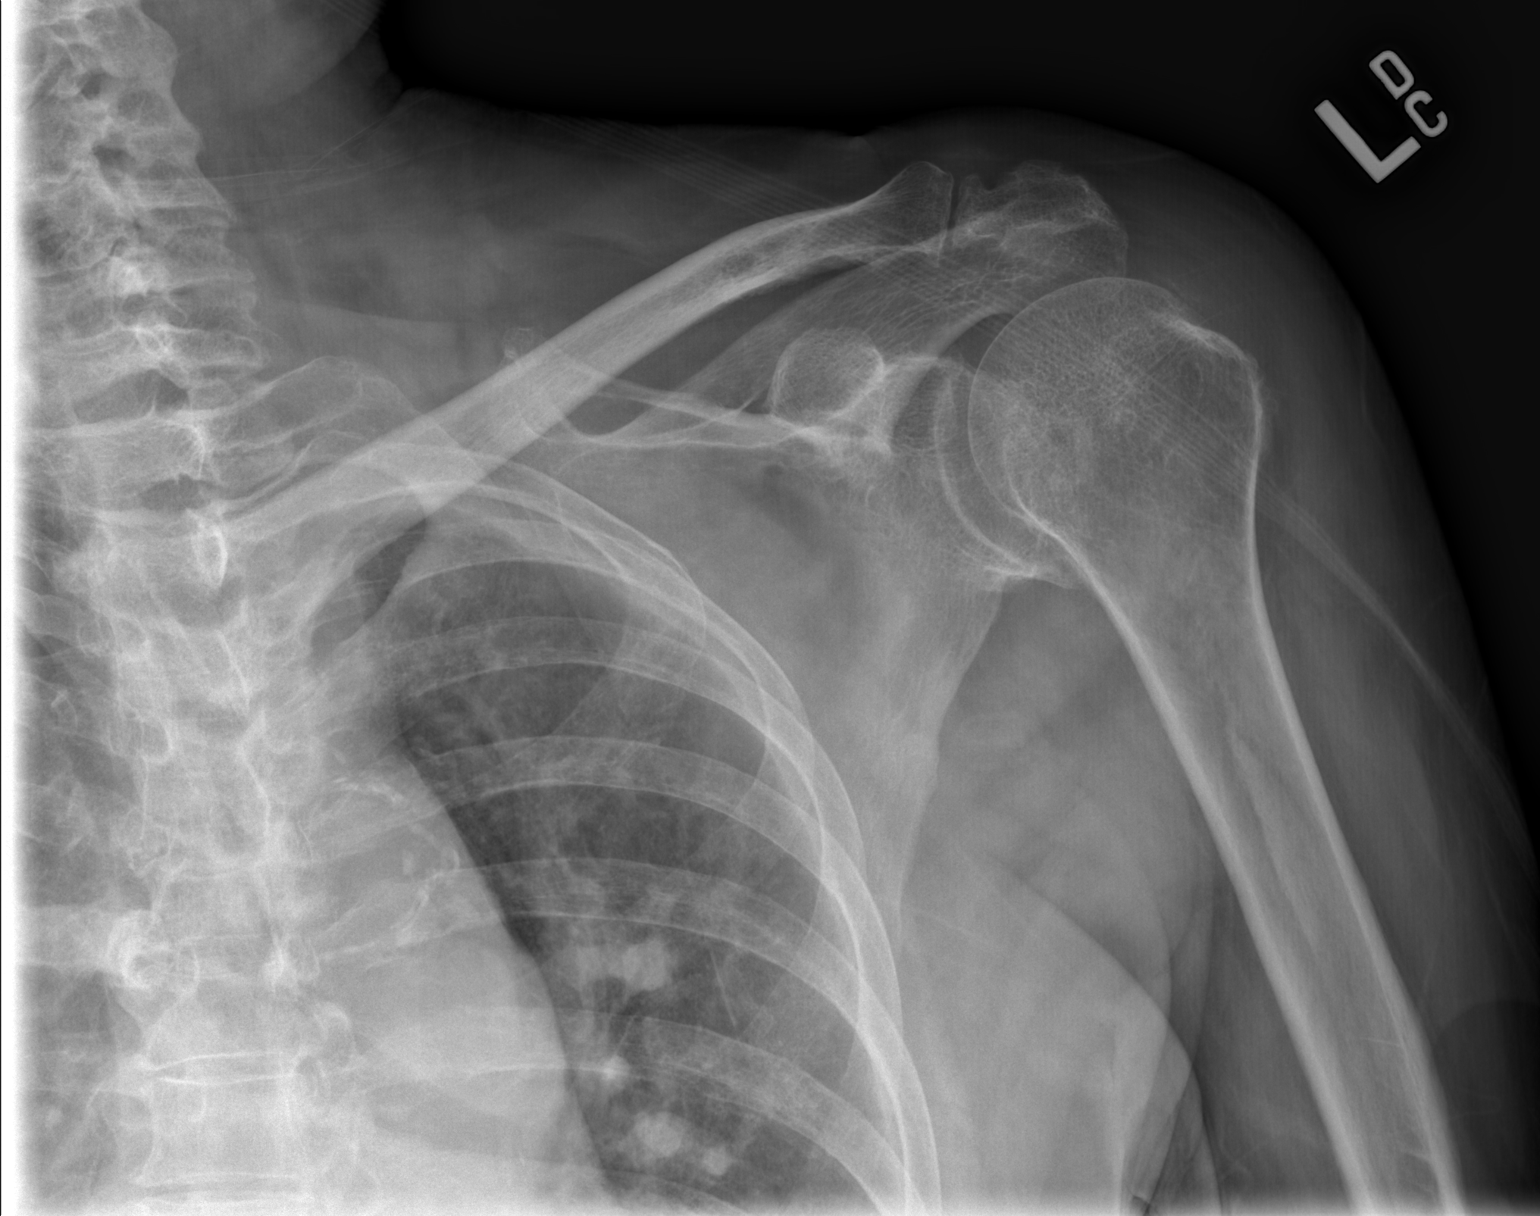

[t shoulder external left (3 of 3)]
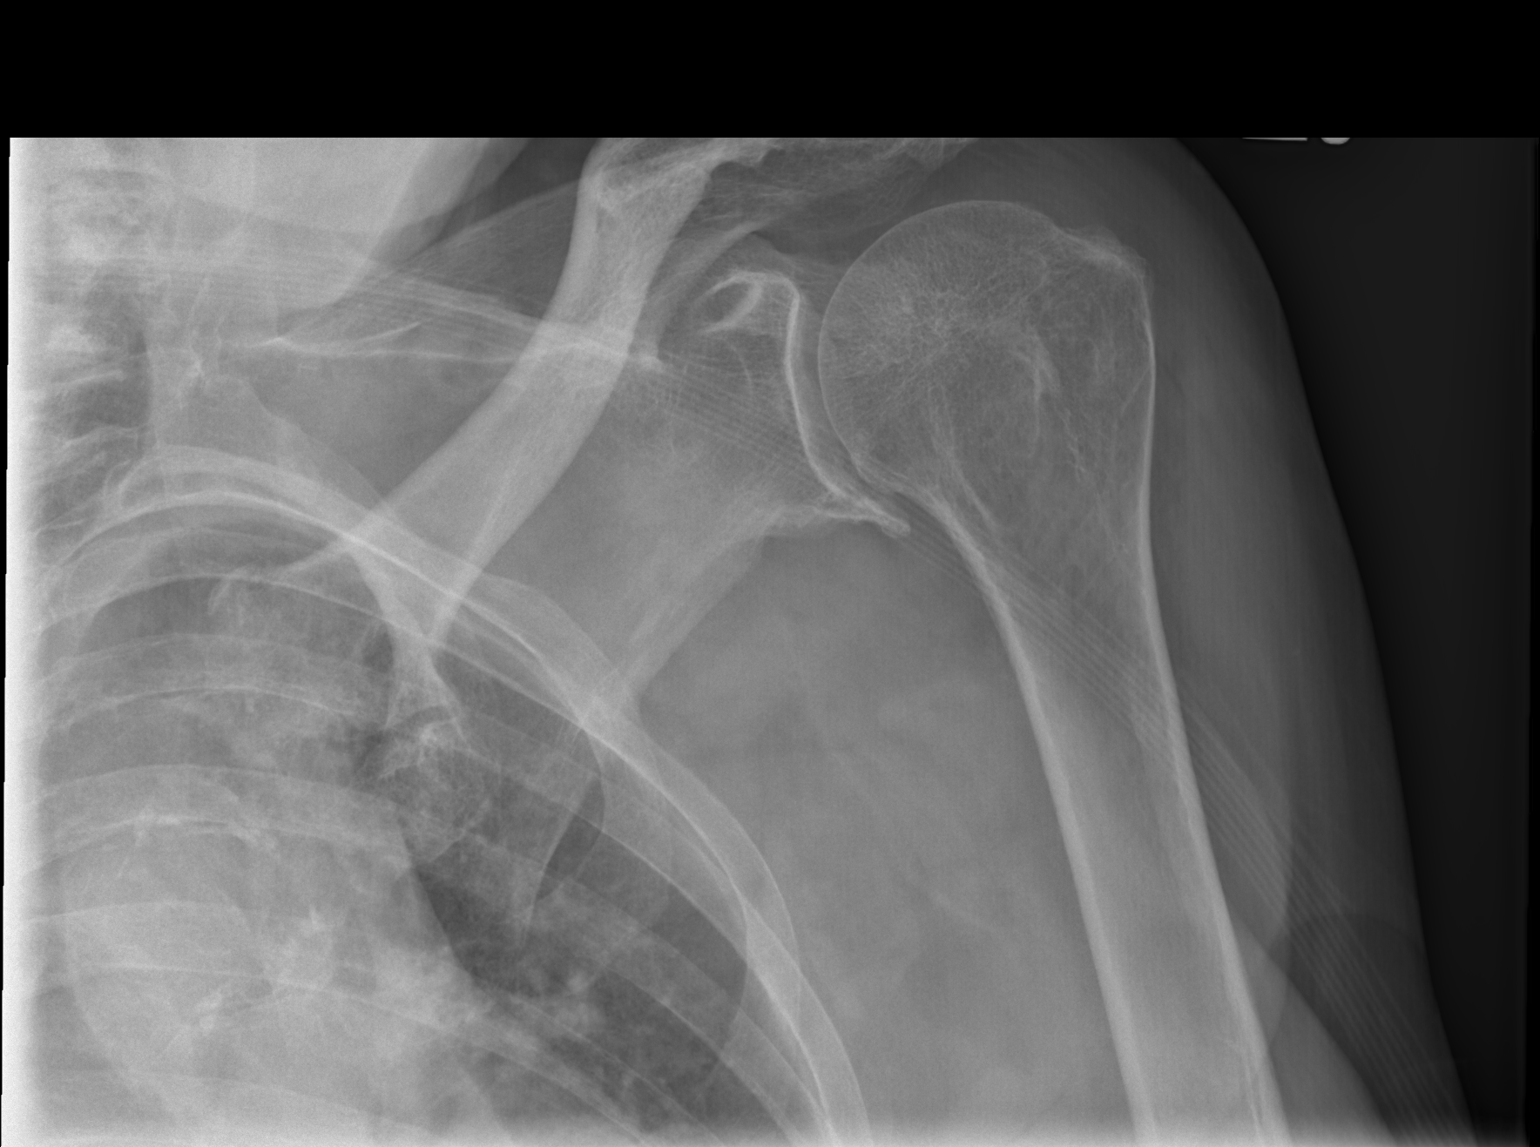

[3 of 3 positions shown; findings below may reference images not displayed]

FINDINGS: There is no evidence of fracture or dislocation. Mild narrowing and
osteophyte formation of the glenohumeral joint is noted. Moderate
narrowing of the acromioclavicular joint is noted. Soft tissues are
unremarkable.
IMPRESSION: Degenerative joint disease of the left acromioclavicular and
glenohumeral joints. No acute abnormality seen in the left shoulder.

## 2017-03-11 DIAGNOSIS — I130 Hypertensive heart and chronic kidney disease with heart failure and stage 1 through stage 4 chronic kidney disease, or unspecified chronic kidney disease: Secondary | ICD-10-CM | POA: Diagnosis not present

## 2017-03-11 DIAGNOSIS — I5022 Chronic systolic (congestive) heart failure: Secondary | ICD-10-CM | POA: Diagnosis not present

## 2017-03-11 DIAGNOSIS — I69354 Hemiplegia and hemiparesis following cerebral infarction affecting left non-dominant side: Secondary | ICD-10-CM | POA: Diagnosis not present

## 2017-03-11 DIAGNOSIS — R001 Bradycardia, unspecified: Secondary | ICD-10-CM | POA: Diagnosis not present

## 2017-03-11 DIAGNOSIS — E039 Hypothyroidism, unspecified: Secondary | ICD-10-CM | POA: Diagnosis not present

## 2017-03-11 DIAGNOSIS — N182 Chronic kidney disease, stage 2 (mild): Secondary | ICD-10-CM | POA: Diagnosis not present

## 2017-03-11 DIAGNOSIS — E1122 Type 2 diabetes mellitus with diabetic chronic kidney disease: Secondary | ICD-10-CM | POA: Diagnosis not present

## 2017-03-11 DIAGNOSIS — I482 Chronic atrial fibrillation: Secondary | ICD-10-CM | POA: Diagnosis not present

## 2017-03-11 DIAGNOSIS — Z7982 Long term (current) use of aspirin: Secondary | ICD-10-CM | POA: Diagnosis not present

## 2017-03-11 DIAGNOSIS — D631 Anemia in chronic kidney disease: Secondary | ICD-10-CM | POA: Diagnosis not present

## 2017-03-11 DIAGNOSIS — I2699 Other pulmonary embolism without acute cor pulmonale: Secondary | ICD-10-CM | POA: Diagnosis not present

## 2017-03-13 DIAGNOSIS — E1122 Type 2 diabetes mellitus with diabetic chronic kidney disease: Secondary | ICD-10-CM | POA: Diagnosis not present

## 2017-03-13 DIAGNOSIS — I5022 Chronic systolic (congestive) heart failure: Secondary | ICD-10-CM | POA: Diagnosis not present

## 2017-03-13 DIAGNOSIS — I2699 Other pulmonary embolism without acute cor pulmonale: Secondary | ICD-10-CM | POA: Diagnosis not present

## 2017-03-13 DIAGNOSIS — Z7982 Long term (current) use of aspirin: Secondary | ICD-10-CM | POA: Diagnosis not present

## 2017-03-13 DIAGNOSIS — I69354 Hemiplegia and hemiparesis following cerebral infarction affecting left non-dominant side: Secondary | ICD-10-CM | POA: Diagnosis not present

## 2017-03-13 DIAGNOSIS — N182 Chronic kidney disease, stage 2 (mild): Secondary | ICD-10-CM | POA: Diagnosis not present

## 2017-03-13 DIAGNOSIS — E039 Hypothyroidism, unspecified: Secondary | ICD-10-CM | POA: Diagnosis not present

## 2017-03-13 DIAGNOSIS — D631 Anemia in chronic kidney disease: Secondary | ICD-10-CM | POA: Diagnosis not present

## 2017-03-13 DIAGNOSIS — R001 Bradycardia, unspecified: Secondary | ICD-10-CM | POA: Diagnosis not present

## 2017-03-13 DIAGNOSIS — I130 Hypertensive heart and chronic kidney disease with heart failure and stage 1 through stage 4 chronic kidney disease, or unspecified chronic kidney disease: Secondary | ICD-10-CM | POA: Diagnosis not present

## 2017-03-13 DIAGNOSIS — I482 Chronic atrial fibrillation: Secondary | ICD-10-CM | POA: Diagnosis not present

## 2017-03-15 DIAGNOSIS — N182 Chronic kidney disease, stage 2 (mild): Secondary | ICD-10-CM | POA: Diagnosis not present

## 2017-03-15 DIAGNOSIS — I130 Hypertensive heart and chronic kidney disease with heart failure and stage 1 through stage 4 chronic kidney disease, or unspecified chronic kidney disease: Secondary | ICD-10-CM | POA: Diagnosis not present

## 2017-03-15 DIAGNOSIS — I5022 Chronic systolic (congestive) heart failure: Secondary | ICD-10-CM | POA: Diagnosis not present

## 2017-03-15 DIAGNOSIS — I2699 Other pulmonary embolism without acute cor pulmonale: Secondary | ICD-10-CM | POA: Diagnosis not present

## 2017-03-15 DIAGNOSIS — E039 Hypothyroidism, unspecified: Secondary | ICD-10-CM | POA: Diagnosis not present

## 2017-03-15 DIAGNOSIS — D631 Anemia in chronic kidney disease: Secondary | ICD-10-CM | POA: Diagnosis not present

## 2017-03-15 DIAGNOSIS — I69354 Hemiplegia and hemiparesis following cerebral infarction affecting left non-dominant side: Secondary | ICD-10-CM | POA: Diagnosis not present

## 2017-03-15 DIAGNOSIS — Z7982 Long term (current) use of aspirin: Secondary | ICD-10-CM | POA: Diagnosis not present

## 2017-03-15 DIAGNOSIS — I482 Chronic atrial fibrillation: Secondary | ICD-10-CM | POA: Diagnosis not present

## 2017-03-15 DIAGNOSIS — E1122 Type 2 diabetes mellitus with diabetic chronic kidney disease: Secondary | ICD-10-CM | POA: Diagnosis not present

## 2017-03-15 DIAGNOSIS — R001 Bradycardia, unspecified: Secondary | ICD-10-CM | POA: Diagnosis not present

## 2017-03-20 DIAGNOSIS — I69354 Hemiplegia and hemiparesis following cerebral infarction affecting left non-dominant side: Secondary | ICD-10-CM | POA: Diagnosis not present

## 2017-03-20 DIAGNOSIS — E1122 Type 2 diabetes mellitus with diabetic chronic kidney disease: Secondary | ICD-10-CM | POA: Diagnosis not present

## 2017-03-20 DIAGNOSIS — I5022 Chronic systolic (congestive) heart failure: Secondary | ICD-10-CM | POA: Diagnosis not present

## 2017-03-20 DIAGNOSIS — N182 Chronic kidney disease, stage 2 (mild): Secondary | ICD-10-CM | POA: Diagnosis not present

## 2017-03-20 DIAGNOSIS — I130 Hypertensive heart and chronic kidney disease with heart failure and stage 1 through stage 4 chronic kidney disease, or unspecified chronic kidney disease: Secondary | ICD-10-CM | POA: Diagnosis not present

## 2017-03-20 DIAGNOSIS — R001 Bradycardia, unspecified: Secondary | ICD-10-CM | POA: Diagnosis not present

## 2017-03-20 DIAGNOSIS — E039 Hypothyroidism, unspecified: Secondary | ICD-10-CM | POA: Diagnosis not present

## 2017-03-20 DIAGNOSIS — I2699 Other pulmonary embolism without acute cor pulmonale: Secondary | ICD-10-CM | POA: Diagnosis not present

## 2017-03-20 DIAGNOSIS — I482 Chronic atrial fibrillation: Secondary | ICD-10-CM | POA: Diagnosis not present

## 2017-03-20 DIAGNOSIS — D631 Anemia in chronic kidney disease: Secondary | ICD-10-CM | POA: Diagnosis not present

## 2017-03-20 DIAGNOSIS — Z7982 Long term (current) use of aspirin: Secondary | ICD-10-CM | POA: Diagnosis not present

## 2017-03-23 ENCOUNTER — Other Ambulatory Visit: Payer: Self-pay | Admitting: Internal Medicine

## 2017-03-27 DIAGNOSIS — Z7982 Long term (current) use of aspirin: Secondary | ICD-10-CM | POA: Diagnosis not present

## 2017-03-27 DIAGNOSIS — I130 Hypertensive heart and chronic kidney disease with heart failure and stage 1 through stage 4 chronic kidney disease, or unspecified chronic kidney disease: Secondary | ICD-10-CM | POA: Diagnosis not present

## 2017-03-27 DIAGNOSIS — R001 Bradycardia, unspecified: Secondary | ICD-10-CM | POA: Diagnosis not present

## 2017-03-27 DIAGNOSIS — N182 Chronic kidney disease, stage 2 (mild): Secondary | ICD-10-CM | POA: Diagnosis not present

## 2017-03-27 DIAGNOSIS — I482 Chronic atrial fibrillation: Secondary | ICD-10-CM | POA: Diagnosis not present

## 2017-03-27 DIAGNOSIS — D631 Anemia in chronic kidney disease: Secondary | ICD-10-CM | POA: Diagnosis not present

## 2017-03-27 DIAGNOSIS — I69354 Hemiplegia and hemiparesis following cerebral infarction affecting left non-dominant side: Secondary | ICD-10-CM | POA: Diagnosis not present

## 2017-03-27 DIAGNOSIS — I2699 Other pulmonary embolism without acute cor pulmonale: Secondary | ICD-10-CM | POA: Diagnosis not present

## 2017-03-27 DIAGNOSIS — E1122 Type 2 diabetes mellitus with diabetic chronic kidney disease: Secondary | ICD-10-CM | POA: Diagnosis not present

## 2017-03-27 DIAGNOSIS — E039 Hypothyroidism, unspecified: Secondary | ICD-10-CM | POA: Diagnosis not present

## 2017-03-27 DIAGNOSIS — I5022 Chronic systolic (congestive) heart failure: Secondary | ICD-10-CM | POA: Diagnosis not present

## 2017-05-01 ENCOUNTER — Other Ambulatory Visit: Payer: Self-pay | Admitting: Internal Medicine

## 2017-05-21 ENCOUNTER — Emergency Department (HOSPITAL_COMMUNITY): Payer: Medicare Other

## 2017-05-21 ENCOUNTER — Emergency Department (HOSPITAL_COMMUNITY)
Admission: EM | Admit: 2017-05-21 | Discharge: 2017-05-21 | Disposition: A | Discharge status: Home / Self Care | Payer: Medicare Other | Source: Home / Self Care | Attending: Emergency Medicine | Admitting: Emergency Medicine

## 2017-05-21 ENCOUNTER — Encounter (HOSPITAL_COMMUNITY): Payer: Self-pay | Admitting: *Deleted

## 2017-05-21 DIAGNOSIS — Z8673 Personal history of transient ischemic attack (TIA), and cerebral infarction without residual deficits: Secondary | ICD-10-CM

## 2017-05-21 DIAGNOSIS — F0151 Vascular dementia with behavioral disturbance: Secondary | ICD-10-CM | POA: Diagnosis not present

## 2017-05-21 DIAGNOSIS — I4891 Unspecified atrial fibrillation: Secondary | ICD-10-CM | POA: Diagnosis not present

## 2017-05-21 DIAGNOSIS — R1312 Dysphagia, oropharyngeal phase: Secondary | ICD-10-CM | POA: Diagnosis not present

## 2017-05-21 DIAGNOSIS — Z79899 Other long term (current) drug therapy: Secondary | ICD-10-CM

## 2017-05-21 DIAGNOSIS — R488 Other symbolic dysfunctions: Secondary | ICD-10-CM | POA: Diagnosis not present

## 2017-05-21 DIAGNOSIS — I158 Other secondary hypertension: Secondary | ICD-10-CM

## 2017-05-21 DIAGNOSIS — N179 Acute kidney failure, unspecified: Secondary | ICD-10-CM | POA: Diagnosis not present

## 2017-05-21 DIAGNOSIS — I482 Chronic atrial fibrillation: Secondary | ICD-10-CM | POA: Insufficient documentation

## 2017-05-21 DIAGNOSIS — I5042 Chronic combined systolic (congestive) and diastolic (congestive) heart failure: Secondary | ICD-10-CM

## 2017-05-21 DIAGNOSIS — R079 Chest pain, unspecified: Secondary | ICD-10-CM | POA: Insufficient documentation

## 2017-05-21 DIAGNOSIS — M6281 Muscle weakness (generalized): Secondary | ICD-10-CM | POA: Diagnosis not present

## 2017-05-21 DIAGNOSIS — K219 Gastro-esophageal reflux disease without esophagitis: Secondary | ICD-10-CM | POA: Diagnosis not present

## 2017-05-21 DIAGNOSIS — Z6824 Body mass index (BMI) 24.0-24.9, adult: Secondary | ICD-10-CM | POA: Diagnosis not present

## 2017-05-21 DIAGNOSIS — F05 Delirium due to known physiological condition: Secondary | ICD-10-CM | POA: Diagnosis not present

## 2017-05-21 DIAGNOSIS — N390 Urinary tract infection, site not specified: Secondary | ICD-10-CM | POA: Diagnosis not present

## 2017-05-21 DIAGNOSIS — G9341 Metabolic encephalopathy: Secondary | ICD-10-CM | POA: Diagnosis not present

## 2017-05-21 DIAGNOSIS — R0602 Shortness of breath: Secondary | ICD-10-CM | POA: Diagnosis not present

## 2017-05-21 DIAGNOSIS — E039 Hypothyroidism, unspecified: Secondary | ICD-10-CM | POA: Insufficient documentation

## 2017-05-21 DIAGNOSIS — R531 Weakness: Secondary | ICD-10-CM | POA: Diagnosis not present

## 2017-05-21 DIAGNOSIS — Z66 Do not resuscitate: Secondary | ICD-10-CM | POA: Diagnosis not present

## 2017-05-21 DIAGNOSIS — F0150 Vascular dementia without behavioral disturbance: Secondary | ICD-10-CM | POA: Diagnosis not present

## 2017-05-21 DIAGNOSIS — Z634 Disappearance and death of family member: Secondary | ICD-10-CM | POA: Diagnosis not present

## 2017-05-21 DIAGNOSIS — I110 Hypertensive heart disease with heart failure: Secondary | ICD-10-CM | POA: Diagnosis not present

## 2017-05-21 DIAGNOSIS — N183 Chronic kidney disease, stage 3 (moderate): Secondary | ICD-10-CM | POA: Insufficient documentation

## 2017-05-21 DIAGNOSIS — I714 Abdominal aortic aneurysm, without rupture: Secondary | ICD-10-CM | POA: Diagnosis not present

## 2017-05-21 DIAGNOSIS — G9340 Encephalopathy, unspecified: Secondary | ICD-10-CM | POA: Diagnosis not present

## 2017-05-21 DIAGNOSIS — Z888 Allergy status to other drugs, medicaments and biological substances status: Secondary | ICD-10-CM | POA: Diagnosis not present

## 2017-05-21 DIAGNOSIS — Z881 Allergy status to other antibiotic agents status: Secondary | ICD-10-CM | POA: Diagnosis not present

## 2017-05-21 DIAGNOSIS — R627 Adult failure to thrive: Secondary | ICD-10-CM | POA: Diagnosis not present

## 2017-05-21 DIAGNOSIS — E1122 Type 2 diabetes mellitus with diabetic chronic kidney disease: Secondary | ICD-10-CM | POA: Insufficient documentation

## 2017-05-21 DIAGNOSIS — I509 Heart failure, unspecified: Secondary | ICD-10-CM | POA: Diagnosis not present

## 2017-05-21 DIAGNOSIS — I130 Hypertensive heart and chronic kidney disease with heart failure and stage 1 through stage 4 chronic kidney disease, or unspecified chronic kidney disease: Secondary | ICD-10-CM

## 2017-05-21 DIAGNOSIS — Z87440 Personal history of urinary (tract) infections: Secondary | ICD-10-CM | POA: Diagnosis not present

## 2017-05-21 DIAGNOSIS — Z85038 Personal history of other malignant neoplasm of large intestine: Secondary | ICD-10-CM | POA: Diagnosis not present

## 2017-05-21 DIAGNOSIS — M109 Gout, unspecified: Secondary | ICD-10-CM | POA: Diagnosis not present

## 2017-05-21 DIAGNOSIS — E7800 Pure hypercholesterolemia, unspecified: Secondary | ICD-10-CM | POA: Diagnosis not present

## 2017-05-21 DIAGNOSIS — I5030 Unspecified diastolic (congestive) heart failure: Secondary | ICD-10-CM | POA: Diagnosis not present

## 2017-05-21 DIAGNOSIS — R634 Abnormal weight loss: Secondary | ICD-10-CM | POA: Diagnosis not present

## 2017-05-21 DIAGNOSIS — I4821 Permanent atrial fibrillation: Secondary | ICD-10-CM

## 2017-05-21 DIAGNOSIS — I5032 Chronic diastolic (congestive) heart failure: Secondary | ICD-10-CM | POA: Diagnosis not present

## 2017-05-21 DIAGNOSIS — E876 Hypokalemia: Secondary | ICD-10-CM

## 2017-05-21 DIAGNOSIS — N3000 Acute cystitis without hematuria: Secondary | ICD-10-CM | POA: Diagnosis not present

## 2017-05-21 DIAGNOSIS — I6789 Other cerebrovascular disease: Secondary | ICD-10-CM | POA: Diagnosis not present

## 2017-05-21 DIAGNOSIS — R2689 Other abnormalities of gait and mobility: Secondary | ICD-10-CM | POA: Diagnosis not present

## 2017-05-21 DIAGNOSIS — I129 Hypertensive chronic kidney disease with stage 1 through stage 4 chronic kidney disease, or unspecified chronic kidney disease: Secondary | ICD-10-CM | POA: Diagnosis not present

## 2017-05-21 LAB — BASIC METABOLIC PANEL
Anion gap: 12 (ref 5–15)
BUN: 12 mg/dL (ref 6–20)
CHLORIDE: 104 mmol/L (ref 101–111)
CO2: 25 mmol/L (ref 22–32)
CREATININE: 1.33 mg/dL — AB (ref 0.44–1.00)
Calcium: 8.9 mg/dL (ref 8.9–10.3)
GFR calc Af Amer: 41 mL/min — ABNORMAL LOW (ref >60)
GFR calc non Af Amer: 36 mL/min — ABNORMAL LOW (ref >60)
Glucose, Bld: 165 mg/dL — ABNORMAL HIGH (ref 65–99)
Potassium: 3.1 mmol/L — ABNORMAL LOW (ref 3.5–5.1)
Sodium: 141 mmol/L (ref 135–145)

## 2017-05-21 LAB — I-STAT TROPONIN, ED: TROPONIN I, POC: 0.04 ng/mL (ref 0.00–0.08)

## 2017-05-21 LAB — CBC
HCT: 37.9 % (ref 36.0–46.0)
Hemoglobin: 13.2 g/dL (ref 12.0–15.0)
MCH: 25.8 pg — ABNORMAL LOW (ref 26.0–34.0)
MCHC: 34.8 g/dL (ref 30.0–36.0)
MCV: 74 fL — AB (ref 78.0–100.0)
Platelets: 247 10*3/uL (ref 150–400)
RBC: 5.12 MIL/uL — AB (ref 3.87–5.11)
RDW: 16 % — ABNORMAL HIGH (ref 11.5–15.5)
WBC: 6.7 10*3/uL (ref 4.0–10.5)

## 2017-05-21 MED ORDER — POTASSIUM CHLORIDE 10 MEQ/100ML IV SOLN
10.0000 meq | Freq: Once | INTRAVENOUS | Status: AC
  Administered 2017-05-21: 10 meq via INTRAVENOUS
  Filled 2017-05-21: qty 100

## 2017-05-21 MED ORDER — LABETALOL HCL 200 MG PO TABS: 200.0000 mg | ORAL_TABLET | Freq: Two times a day (BID) | ORAL | 0 refills | Status: DC

## 2017-05-21 MED ORDER — POTASSIUM CHLORIDE CRYS ER 20 MEQ PO TBCR: 20.0000 meq | EXTENDED_RELEASE_TABLET | Freq: Two times a day (BID) | ORAL | 0 refills | Status: DC

## 2017-05-21 MED ORDER — LABETALOL HCL 5 MG/ML IV SOLN
20.0000 mg | Freq: Once | INTRAVENOUS | Status: AC
  Administered 2017-05-21: 20 mg via INTRAVENOUS
  Filled 2017-05-21: qty 4

## 2017-05-21 NOTE — ED Notes (Signed)
Dr. Leonette Monarch ( EDP ) explained tests results and plan of care to pt. and family .

## 2017-05-21 NOTE — Discharge Instructions (Signed)
Please start taking her labetalol again.  Follow-up closely with your primary care provider and cardiologist.

## 2017-05-21 NOTE — ED Notes (Signed)
Patient transported to X-ray 

## 2017-05-21 NOTE — ED Triage Notes (Signed)
Pt c/o R sided CP onset x 3 days ago intermittent, pt c/o SOB, pt c/o vomiting x 4-5 times today, denies diarrhea, pt in a flutter in triage, HR 135 irregular, A&O x4

## 2017-05-21 NOTE — ED Provider Notes (Addendum)
[facility] EMERGENCY DEPARTMENT Provider Note  CSN: [id number] Arrival date & time: 05/21/17 1807  Chief Complaint(s) Chest Pain  HPI Nicole [name] is a 83 y.o. female with an extensive past medical history listed below including hypertension, atrial fibrillation not on any anticoagulation. She presents today for reported chest pain.  History is limited due to the patient's dementia, and she has difficulty providing details surrounding her chest pain.  Family reports that she complained earlier today about a chest pain.  Currently the patient denies any chest pain, shortness of breath, palpitations, headache, lightheadedness, dizziness, nausea.  Patient and family deny any recent fevers, infections, vomiting.  HPI  Remainder of history, ROS, and physical exam limited due to patient's condition (dementia). Additional information was obtained from family and care giver.   Level V Caveat.   Past Medical History Past Medical History:  Diagnosis Date  . AAA (abdominal aortic aneurysm) ([place])   . Acute upper respiratory infections of unspecified site   . Anemia   . Anxiety   . Aortic aneurysm of unspecified site without mention of rupture   . Arthritis   . Atrial fibrillation ([place])   . Cervicalgia   . Cholelithiasis   . Chronic systolic heart failure ([place])   . CKD (chronic kidney disease)   . Colon cancer ([place]) 07/1997  . Depression   . Diabetes mellitus ([place])   . Dizziness and giddiness   . Electrolyte and fluid disorders not elsewhere classified   . GERD (gastroesophageal reflux disease)   . Gout, unspecified   . Hemorrhage of rectum and anus   . Hypercholesteremia   . Hypertension   . Hypoglycemia, unspecified   . Hypopotassemia   . Hypothyroidism   . Kidney stone    renal calculi  . Malignant neoplasm of colon, unspecified site   . Other chronic allergic conjunctivitis   . Other specified cardiac dysrhythmias(427.89)   . Pain in joint, lower leg     . Perforation of bile duct   . Proteinuria   . Shortness of breath   . Small bowel obstruction due to adhesions ([place]) 05/16/2012  . Stroke ([place])   . Swelling, mass, or lump in head and neck   . Thoracic aneurysm without mention of rupture    4.6 cm Asc Aortic aneurysm, CT 07/2015   Patient Active Problem List   Diagnosis Date Noted  . Palliative care by specialist   . DNR (do not resuscitate)   . Bradyarrhythmia 01/11/2017  . Generalized weakness 01/11/2017  . General weakness 01/11/2017  . PE (pulmonary thromboembolism) ([place]) 08/24/2016  . Pulmonary embolus ([place])   . Accelerated hypertension with diastolic congestive heart failure, NYHA class 3 ([place]) 01/05/2016  . Permanent atrial fibrillation ([place]) 01/05/2016  . Rib pain on right side 10/14/2015  . Hypothyroidism due to acquired atrophy of thyroid 10/14/2015  . Type 2 diabetes mellitus with diabetic nephropathy, without long-term current use of insulin ([place]) 10/14/2015  . Essential hypertension 10/14/2015  . Acute on chronic systolic heart failure ([place]) 08/02/2015  . Acute on chronic diastolic heart failure ([place]) 08/02/2015  . Bradycardia 07/27/2015  . Chronic anemia 07/27/2015  . Chest pain   . Vascular dementia 06/13/2015  . Memory loss 06/13/2015  . Left hemiparesis ([place])   . Chronic diastolic CHF (congestive heart failure), NYHA class 2 ([place])   . Persistent atrial fibrillation ([place])   . Type 2 diabetes mellitus with peripheral neuropathy (HCC)   . ICH (intracerebral hemorrhage) ([place])  04/04/2015  . Chronic atrial fibrillation ([place])   . Hypertensive urgency   . SOB (shortness of breath) 04/03/2014  . Diabetic nephropathy ([place]) 09/07/2013  . Chronic constipation 03/25/2013  . Carpal tunnel syndrome 02/25/2013  . Hypokalemia 05/17/2012  . Normal coronary arteries, 2008 05/15/2012  . Type II or unspecified type diabetes mellitus with neurological manifestations, not stated as uncontrolled(250.60) 05/15/2012  . HTN  (hypertension) 05/15/2012  . Acute respiratory distress 05/15/2012  . Abdominal pain, acute, right upper quadrant 05/12/2012  . Cholecystitis chronic, acute, S/P lap cholecystectomy 05/14/12 05/12/2012  . Thoracic aortic aneurysm without rupture ([place])   . CHF, past NICM with an EF of 30%, most recent echo 2011 showed EF to be45- 50%   . Anemia 08/09/2011  . History of colon cancer, stage III 08/09/2011   Home Medication(s) Prior to Admission medications   Medication Sig Start Date End Date Taking? Authorizing Provider  allopurinol (ZYLOPRIM) 100 MG tablet TAKE 1 TABLET BY MOUTH  DAILY Patient taking differently: TAKE 100 mg TABLET BY MOUTH  DAILY 12/24/16  Yes [name], [name], DO  donepezil (ARICEPT) 10 MG tablet Take one tablet by mouth once daily at bedtime 02/18/17  Yes [name], [name], DO  ferrous sulfate 325 (65 FE) MG EC tablet Take 1 tablet (325 mg total) by mouth 2 (two) times daily with a meal. 02/01/17  Yes Lauree Chandler, NP  furosemide (LASIX) 20 MG tablet Take 1 tablet (20 mg) by mouth every other day, may also take one additional tablet daily for weight gain greater than 3 lbs. 09/03/16  Yes [name], [name], DO  isosorbide mononitrate (IMDUR) 30 MG 24 hr tablet Take 2 tablet by mouth three times daily. 05/02/17  Yes Lauree Chandler, NP  levothyroxine (SYNTHROID, LEVOTHROID) 50 MCG tablet TAKE 1 TABLET BY MOUTH  DAILY BEFORE BREAKFAST 03/26/17  Yes Gildardo Cranker, DO  potassium chloride (K-DUR) 10 MEQ tablet Take 1 tablet (10 mEq total) by mouth every other day. 09/03/16  Yes [name], [name], DO  sertraline (ZOLOFT) 50 MG tablet Take 1 tablet (50 mg total) by mouth daily. 03/04/17  Yes Lauree Chandler, NP  diltiazem (CARDIZEM CD) 120 MG 24 hr capsule Take 1 capsule (120 mg total) by mouth daily. Patient not taking: Reported on 05/21/2017 01/19/17   Barton Dubois, MD  isosorbide-hydrALAZINE (BIDIL) 20-37.5 MG tablet Take 2 tablets by mouth 3 (three) times daily. Patient not  taking: Reported on 05/21/2017 01/18/17   Barton Dubois, MD  labetalol (NORMODYNE) 200 MG tablet Take 1 tablet (200 mg total) by mouth 2 (two) times daily. 05/21/17 06/20/17  Fatima Blank, MD  lisinopril (PRINIVIL,ZESTRIL) 20 MG tablet Take 1 tablet (20 mg total) by mouth daily. Patient not taking: Reported on 05/21/2017 01/19/17   Barton Dubois, MD  Past Surgical History Past Surgical History:  Procedure Laterality Date  . CARDIAC CATHETERIZATION  2008   moderate severe pulm HTN; with elevted pulm capillary wedge pressure   . CHOLECYSTECTOMY  05/14/2012   Procedure: LAPAROSCOPIC CHOLECYSTECTOMY WITH INTRAOPERATIVE CHOLANGIOGRAM;  Surgeon: Haywood Lasso, MD;  Location: [place];  Service: General;  Laterality: N/A;  . COLON SURGERY     to remove colon ca  . ERCP  05/17/2012   Procedure: ENDOSCOPIC RETROGRADE CHOLANGIOPANCREATOGRAPHY (ERCP);  Surgeon: Missy Sabins, MD;  Location: [place];  Service: Gastroenterology;  Laterality: N/A;  . ERCP  08/12/2012   Procedure: ENDOSCOPIC RETROGRADE CHOLANGIOPANCREATOGRAPHY (ERCP);  Surgeon: Missy Sabins, MD;  Location: [facility] ENDOSCOPY;  Service: Endoscopy;  Laterality: N/A;  . HEMICOLECTOMY  08/11/1997  . KIDNEY STONE SURGERY    . TRANSTHORACIC ECHOCARDIOGRAM  11/2009   EF 45-50%, mild-mod AV regurg; mild MR; mod TR; ascending aorta mildly dilated   Family History Family History  Problem Relation Age of Onset  . Heart disease Mother   . Stroke Father   . Hypertension Daughter   . Hypertension Son   . Diabetes Sister   . Hypertension Son     Social History Social History  Substance Use Topics  . Smoking status: Never Smoker  . Smokeless tobacco: Never Used  . Alcohol use No   Allergies Iohexol; Iodinated diagnostic agents; and Z-pak [azithromycin]  Review of Systems Review of Systems  Unable to  perform ROS: Dementia    Physical Exam Vital Signs  I have reviewed the triage vital signs BP (!) 196/149 (BP Location: Right Arm)   Pulse (!) 125   Temp (!) 97.5 F (36.4 C) (Oral)   Resp 14   SpO2 96%  Vitals:   05/21/17 2145 05/21/17 2230  BP: (!) 162/100 (!) 178/104  Pulse: 76 95  Resp: (!) 33 (!) 24  Temp:    TempSrc:    SpO2: 100% 96%  Weight:      Physical Exam  Constitutional: She is oriented to person, place, and time. She appears well-developed and well-nourished. No distress.  HENT:  Head: Normocephalic and atraumatic.  Nose: Nose normal.  Eyes: Pupils are equal, round, and reactive to light. Conjunctivae and EOM are normal. Right eye exhibits no discharge. Left eye exhibits no discharge. No scleral icterus.  Neck: Normal range of motion. Neck supple.  Cardiovascular: An irregularly irregular rhythm present. Tachycardia present.  Exam reveals no gallop and no friction rub.   No murmur heard. Pulmonary/Chest: Effort normal and breath sounds normal. No stridor. No respiratory distress. She has no rales.  Abdominal: Soft. She exhibits no distension. There is no tenderness.  Musculoskeletal: She exhibits no edema or tenderness.  Neurological: She is alert and oriented to person, place, and time.  Skin: Skin is warm and dry. No rash noted. She is not diaphoretic. No erythema.  Psychiatric: She has a normal mood and affect.  Vitals reviewed.   ED Results and Treatments Labs (all labs ordered are listed, but only abnormal results are displayed) Labs Reviewed  BASIC METABOLIC PANEL - Abnormal; Notable for the following:       Result Value   Potassium 3.1 (*)    Glucose, Bld 165 (*)    Creatinine, Ser 1.33 (*)    GFR calc non Af Amer 36 (*)    GFR calc Af Amer 41 (*)    All other components within normal limits  CBC - Abnormal; Notable for the following:  RBC 5.12 (*)    MCV 74.0 (*)    MCH 25.8 (*)    RDW 16.0 (*)    All other components within normal  limits  I-STAT TROPONIN, ED                                                                                                                         EKG  EKG Interpretation  Date/Time:  Tuesday May 21 2017 18:21:37 EDT Ventricular Rate:  123 PR Interval:    QRS Duration: 98 QT Interval:  334 QTC Calculation: 478 R Axis:   71 Text Interpretation:  Atrial flutter with variable A-V block Incomplete right bundle branch block ST & T wave abnormality, consider anterior ischemia Abnormal ECG Otherwise no significant change Confirmed by Addison Lank [phone]) on 05/21/2017 6:32:02 PM      Radiology Dg Chest 2 View  Result Date: 05/21/2017 CLINICAL DATA:  RIGHT-sided chest pain began 3 days ago. EXAM: CHEST  2 VIEW COMPARISON:  01/11/2017. FINDINGS: Cardiomegaly. RIGHT pleural effusion. No consolidation or pulmonary edema. No pneumothorax. Thoracic atherosclerosis. Similar appearance to priors. No acute osseous findings. IMPRESSION: RIGHT pleural effusion similar to priors. Cardiomegaly. No consolidation or edema. Electronically Signed   By: Staci Righter M.D.   On: 05/21/2017 19:17   Pertinent labs & imaging results that were available during my care of the patient were reviewed by me and considered in my medical decision making (see chart for details).  Medications Ordered in ED Medications  labetalol (NORMODYNE,TRANDATE) injection 20 mg (20 mg Intravenous Given 05/21/17 1958)  potassium chloride 10 mEq in 100 mL IVPB (0 mEq Intravenous Stopped 05/21/17 2127)                                                                                                                                    Procedures Procedures  (including critical care time)  Medical Decision Making / ED Course I have reviewed the nursing notes for this encounter and the patient's prior records (if available in EHR or on provided paperwork).    Patient noted to be in A. fib RVR and hypertensive with systolics in the  253G and diastolic in the 644I.  No evidence of endorgan damage on labs.  On further discussion it appears that patient has stopped taking labetalol.  On review of records there is no mention of discontinuation of her labetalol.  She  was given 20 mg of labetalol here which resulted in rate control of her A. fib and improvement in her blood pressure.    Patient also noted to have mild hypokalemia which may be contributing to her A. fib RVR.  She was given oral repletion and will be prescribed short course of potassium.  Patient remained hemodynamically stable and asymptomatic during her stay.  I will place the patient back on her home dose labetalol and recommend close follow-up with primary care provider and cardiology.  Plan was discussed with family who was in agreement.  The patient is safe for discharge with strict return precautions.   Final Clinical Impression(s) / ED Diagnoses Final diagnoses:  Chest pain, unspecified type  Permanent atrial fibrillation ([place])  Other secondary hypertension  Hypokalemia    Disposition: Discharge  Condition: Good  I have discussed the results, Dx and Tx plan with the patient and family who expressed understanding and agree(s) with the plan. Discharge instructions discussed at great length. The patient and family were given strict return precautions who verbalized understanding of the instructions. No further questions at time of discharge.    Current Discharge Medication List      Follow Up: [name], [name], DO [place]. [place] [address] [phone]   in 3-5 days  Pixie Casino, MD [address] [place] [address] [place] [address] [phone]   in 5-7 days     This chart was dictated using voice recognition software.  Despite best efforts to proofread,  errors can occur which can change the documentation meaning.     Fatima Blank, MD 05/21/17 [phone]

## 2017-05-21 NOTE — ED Notes (Signed)
Pt has lost her appetite since she lost her son 2 weeks ago.  Per her caregiver pt has lost weight, her weight today is 134lbs and it was 148lbs in August (14lbs weight loss)

## 2017-05-23 ENCOUNTER — Emergency Department (HOSPITAL_COMMUNITY): Payer: Medicare Other

## 2017-05-23 ENCOUNTER — Encounter (HOSPITAL_COMMUNITY): Payer: Self-pay | Admitting: Emergency Medicine

## 2017-05-23 ENCOUNTER — Inpatient Hospital Stay (HOSPITAL_COMMUNITY)
Admission: EM | Admit: 2017-05-23 | Discharge: 2017-05-27 | DRG: 689 | Disposition: A | Discharge status: Skilled Nursing Facility | Payer: Medicare Other | Attending: Internal Medicine | Admitting: Internal Medicine

## 2017-05-23 ENCOUNTER — Observation Stay (HOSPITAL_COMMUNITY): Payer: Medicare Other

## 2017-05-23 DIAGNOSIS — Z881 Allergy status to other antibiotic agents status: Secondary | ICD-10-CM | POA: Diagnosis not present

## 2017-05-23 DIAGNOSIS — Z634 Disappearance and death of family member: Secondary | ICD-10-CM | POA: Diagnosis not present

## 2017-05-23 DIAGNOSIS — Z8673 Personal history of transient ischemic attack (TIA), and cerebral infarction without residual deficits: Secondary | ICD-10-CM | POA: Diagnosis not present

## 2017-05-23 DIAGNOSIS — F0150 Vascular dementia without behavioral disturbance: Secondary | ICD-10-CM | POA: Diagnosis present

## 2017-05-23 DIAGNOSIS — N3000 Acute cystitis without hematuria: Secondary | ICD-10-CM

## 2017-05-23 DIAGNOSIS — G9340 Encephalopathy, unspecified: Secondary | ICD-10-CM | POA: Diagnosis present

## 2017-05-23 DIAGNOSIS — G9341 Metabolic encephalopathy: Secondary | ICD-10-CM | POA: Diagnosis not present

## 2017-05-23 DIAGNOSIS — F05 Delirium due to known physiological condition: Secondary | ICD-10-CM | POA: Diagnosis present

## 2017-05-23 DIAGNOSIS — Z6824 Body mass index (BMI) 24.0-24.9, adult: Secondary | ICD-10-CM

## 2017-05-23 DIAGNOSIS — I130 Hypertensive heart and chronic kidney disease with heart failure and stage 1 through stage 4 chronic kidney disease, or unspecified chronic kidney disease: Secondary | ICD-10-CM | POA: Diagnosis present

## 2017-05-23 DIAGNOSIS — I110 Hypertensive heart disease with heart failure: Secondary | ICD-10-CM | POA: Diagnosis present

## 2017-05-23 DIAGNOSIS — E039 Hypothyroidism, unspecified: Secondary | ICD-10-CM | POA: Diagnosis not present

## 2017-05-23 DIAGNOSIS — F0151 Vascular dementia with behavioral disturbance: Secondary | ICD-10-CM

## 2017-05-23 DIAGNOSIS — I5030 Unspecified diastolic (congestive) heart failure: Secondary | ICD-10-CM | POA: Diagnosis not present

## 2017-05-23 DIAGNOSIS — N390 Urinary tract infection, site not specified: Secondary | ICD-10-CM | POA: Diagnosis present

## 2017-05-23 DIAGNOSIS — K219 Gastro-esophageal reflux disease without esophagitis: Secondary | ICD-10-CM | POA: Diagnosis present

## 2017-05-23 DIAGNOSIS — Z66 Do not resuscitate: Secondary | ICD-10-CM | POA: Diagnosis not present

## 2017-05-23 DIAGNOSIS — E7800 Pure hypercholesterolemia, unspecified: Secondary | ICD-10-CM | POA: Diagnosis present

## 2017-05-23 DIAGNOSIS — E1142 Type 2 diabetes mellitus with diabetic polyneuropathy: Secondary | ICD-10-CM

## 2017-05-23 DIAGNOSIS — N183 Chronic kidney disease, stage 3 (moderate): Secondary | ICD-10-CM | POA: Diagnosis not present

## 2017-05-23 DIAGNOSIS — I4891 Unspecified atrial fibrillation: Secondary | ICD-10-CM | POA: Diagnosis present

## 2017-05-23 DIAGNOSIS — I509 Heart failure, unspecified: Secondary | ICD-10-CM | POA: Diagnosis not present

## 2017-05-23 DIAGNOSIS — R627 Adult failure to thrive: Secondary | ICD-10-CM | POA: Diagnosis not present

## 2017-05-23 DIAGNOSIS — N179 Acute kidney failure, unspecified: Secondary | ICD-10-CM | POA: Diagnosis not present

## 2017-05-23 DIAGNOSIS — R531 Weakness: Secondary | ICD-10-CM

## 2017-05-23 DIAGNOSIS — R0602 Shortness of breath: Secondary | ICD-10-CM | POA: Diagnosis not present

## 2017-05-23 DIAGNOSIS — E1121 Type 2 diabetes mellitus with diabetic nephropathy: Secondary | ICD-10-CM

## 2017-05-23 DIAGNOSIS — I610 Nontraumatic intracerebral hemorrhage in hemisphere, subcortical: Secondary | ICD-10-CM

## 2017-05-23 DIAGNOSIS — Z888 Allergy status to other drugs, medicaments and biological substances status: Secondary | ICD-10-CM | POA: Diagnosis not present

## 2017-05-23 DIAGNOSIS — R634 Abnormal weight loss: Secondary | ICD-10-CM | POA: Diagnosis present

## 2017-05-23 DIAGNOSIS — Z85038 Personal history of other malignant neoplasm of large intestine: Secondary | ICD-10-CM | POA: Diagnosis not present

## 2017-05-23 LAB — CBC WITH DIFFERENTIAL/PLATELET
BASOS PCT: 0 %
Basophils Absolute: 0 10*3/uL (ref 0.0–0.1)
EOS ABS: 0 10*3/uL (ref 0.0–0.7)
Eosinophils Relative: 0 %
HEMATOCRIT: 37.5 % (ref 36.0–46.0)
HEMOGLOBIN: 13 g/dL (ref 12.0–15.0)
LYMPHS ABS: 0.7 10*3/uL (ref 0.7–4.0)
Lymphocytes Relative: 10 %
MCH: 25.6 pg — AB (ref 26.0–34.0)
MCHC: 34.7 g/dL (ref 30.0–36.0)
MCV: 74 fL — ABNORMAL LOW (ref 78.0–100.0)
MONOS PCT: 7 %
Monocytes Absolute: 0.5 10*3/uL (ref 0.1–1.0)
NEUTROS ABS: 6.2 10*3/uL (ref 1.7–7.7)
NEUTROS PCT: 83 %
Platelets: 231 10*3/uL (ref 150–400)
RBC: 5.07 MIL/uL (ref 3.87–5.11)
RDW: 16.5 % — ABNORMAL HIGH (ref 11.5–15.5)
WBC: 7.5 10*3/uL (ref 4.0–10.5)

## 2017-05-23 LAB — URINALYSIS, ROUTINE W REFLEX MICROSCOPIC
GLUCOSE, UA: NEGATIVE mg/dL
HGB URINE DIPSTICK: NEGATIVE
KETONES UR: NEGATIVE mg/dL
LEUKOCYTES UA: NEGATIVE
NITRITE: NEGATIVE
Specific Gravity, Urine: 1.026 (ref 1.005–1.030)
pH: 5 (ref 5.0–8.0)

## 2017-05-23 LAB — I-STAT CG4 LACTIC ACID, ED
LACTIC ACID, VENOUS: 2.63 mmol/L — AB (ref 0.5–1.9)
Lactic Acid, Venous: 2.69 mmol/L (ref 0.5–1.9)

## 2017-05-23 LAB — BASIC METABOLIC PANEL
Anion gap: 11 (ref 5–15)
BUN: 20 mg/dL (ref 6–20)
CHLORIDE: 106 mmol/L (ref 101–111)
CO2: 26 mmol/L (ref 22–32)
Calcium: 8.7 mg/dL — ABNORMAL LOW (ref 8.9–10.3)
Creatinine, Ser: 1.78 mg/dL — ABNORMAL HIGH (ref 0.44–1.00)
GFR calc non Af Amer: 25 mL/min — ABNORMAL LOW (ref >60)
GFR, EST AFRICAN AMERICAN: 29 mL/min — AB (ref >60)
Glucose, Bld: 151 mg/dL — ABNORMAL HIGH (ref 65–99)
POTASSIUM: 3.7 mmol/L (ref 3.5–5.1)
SODIUM: 143 mmol/L (ref 135–145)

## 2017-05-23 LAB — TSH: TSH: 0.24 u[IU]/mL — ABNORMAL LOW (ref 0.350–4.500)

## 2017-05-23 LAB — I-STAT TROPONIN, ED: Troponin i, poc: 0.05 ng/mL (ref 0.00–0.08)

## 2017-05-23 MED ORDER — ENOXAPARIN SODIUM 40 MG/0.4ML ~~LOC~~ SOLN: 40.0000 mg | SUBCUTANEOUS | Status: DC

## 2017-05-23 MED ORDER — SODIUM CHLORIDE 0.9 % IV BOLUS (SEPSIS)
1000.0000 mL | Freq: Once | INTRAVENOUS | Status: AC
  Administered 2017-05-23: 1000 mL via INTRAVENOUS

## 2017-05-23 MED ORDER — ACETAMINOPHEN 325 MG PO TABS
650.0000 mg | ORAL_TABLET | Freq: Four times a day (QID) | ORAL | Status: DC | PRN
  Administered 2017-05-23 – 2017-05-25 (×2): 650 mg via ORAL
  Filled 2017-05-23 (×2): qty 2

## 2017-05-23 MED ORDER — ACETAMINOPHEN 650 MG RE SUPP
650.0000 mg | Freq: Four times a day (QID) | RECTAL | Status: DC | PRN
Start: 2017-05-23 — End: 2017-05-27

## 2017-05-23 MED ORDER — ONDANSETRON HCL 4 MG/2ML IJ SOLN: 4.0000 mg | Freq: Four times a day (QID) | INTRAMUSCULAR | Status: DC | PRN

## 2017-05-23 MED ORDER — DEXTROSE 5 % IV SOLN
1.0000 g | INTRAVENOUS | Status: DC
  Administered 2017-05-23 – 2017-05-27 (×5): 1 g via INTRAVENOUS
  Filled 2017-05-23 (×6): qty 10

## 2017-05-23 MED ORDER — FERROUS SULFATE 325 (65 FE) MG PO TABS
325.0000 mg | ORAL_TABLET | Freq: Two times a day (BID) | ORAL | Status: DC
  Administered 2017-05-23 – 2017-05-27 (×9): 325 mg via ORAL
  Filled 2017-05-23 (×10): qty 1

## 2017-05-23 MED ORDER — SODIUM CHLORIDE 0.9 % IV SOLN
INTRAVENOUS | Status: DC
Start: 2017-05-23 — End: 2017-05-27
  Administered 2017-05-23 – 2017-05-27 (×9): via INTRAVENOUS

## 2017-05-23 MED ORDER — LEVOTHYROXINE SODIUM 100 MCG IV SOLR
25.0000 ug | Freq: Every day | INTRAVENOUS | Status: DC
  Administered 2017-05-23 – 2017-05-24 (×2): 25 ug via INTRAVENOUS
  Filled 2017-05-23 (×3): qty 5

## 2017-05-23 MED ORDER — LABETALOL HCL 5 MG/ML IV SOLN: 5.0000 mg | INTRAVENOUS | Status: DC | PRN

## 2017-05-23 MED ORDER — ISOSORB DINITRATE-HYDRALAZINE 20-37.5 MG PO TABS
2.0000 | ORAL_TABLET | Freq: Three times a day (TID) | ORAL | Status: DC
  Administered 2017-05-23 – 2017-05-27 (×13): 2 via ORAL
  Filled 2017-05-23 (×14): qty 2

## 2017-05-23 MED ORDER — ONDANSETRON HCL 4 MG PO TABS
4.0000 mg | ORAL_TABLET | Freq: Four times a day (QID) | ORAL | Status: DC | PRN
  Administered 2017-05-23: 4 mg via ORAL
  Filled 2017-05-23: qty 1

## 2017-05-23 MED ORDER — DONEPEZIL HCL 10 MG PO TABS
10.0000 mg | ORAL_TABLET | Freq: Every day | ORAL | Status: DC
  Administered 2017-05-23 – 2017-05-26 (×4): 10 mg via ORAL
  Filled 2017-05-23 (×4): qty 1

## 2017-05-23 MED ORDER — LABETALOL HCL 200 MG PO TABS
200.0000 mg | ORAL_TABLET | Freq: Two times a day (BID) | ORAL | Status: DC
  Administered 2017-05-23 – 2017-05-27 (×8): 200 mg via ORAL
  Filled 2017-05-23 (×8): qty 1

## 2017-05-23 MED ORDER — SERTRALINE HCL 50 MG PO TABS
50.0000 mg | ORAL_TABLET | Freq: Every day | ORAL | Status: DC
  Administered 2017-05-23 – 2017-05-27 (×5): 50 mg via ORAL
  Filled 2017-05-23 (×6): qty 1

## 2017-05-23 MED ORDER — ENSURE ENLIVE PO LIQD
237.0000 mL | Freq: Two times a day (BID) | ORAL | Status: DC
  Administered 2017-05-24 – 2017-05-27 (×5): 237 mL via ORAL

## 2017-05-23 MED ORDER — ALLOPURINOL 100 MG PO TABS
100.0000 mg | ORAL_TABLET | Freq: Every day | ORAL | Status: DC
  Administered 2017-05-23 – 2017-05-27 (×5): 100 mg via ORAL
  Filled 2017-05-23 (×6): qty 1

## 2017-05-23 NOTE — ED Notes (Signed)
Patient stated month was August.

## 2017-05-23 NOTE — ED Notes (Signed)
Speech therapy concluded; brief change; very little output in last brief. Patient transported upstairs with belongings in bag.

## 2017-05-23 NOTE — ED Notes (Signed)
Pt returned from MRI; back on monitor; speech therapist in unit to do swallow eval.

## 2017-05-23 NOTE — Progress Notes (Signed)
83 y/o female admitted from ED for wt loss, failure to thrive.  Pt received via stretcher and was awake and alert oriented to self, re-oriented to situation, place and time.  Pt escorted via two person assist and ambulated with unsteady gait to bed.  PIV infusing NS via pump at 168mls/hr to left arm 76H without complications.  Pt has on brief.  Dual skin assessment completed with Piedad Climes RN.  Pt had swallow eval in ER, diet modified to dysphagia 2.  Pt family up to room and pt does recognize family members.  No acute distress observed at this time, call bell in reach, bed locked and low, will continue to monitor, family at bedside.

## 2017-05-23 NOTE — ED Notes (Addendum)
MRI contacted to see if they have gotten in touch with family to do screening. They have not had chance.

## 2017-05-23 NOTE — Progress Notes (Signed)
  PROGRESS NOTE    Nicole [name]  GYF:[id number] DOB: 1931-07-12 DOA: 05/23/2017 PCP: Nicole Curry, DO   Chief Complaint  Patient presents with  . Altered Mental Status  . Possible UTI    Brief Narrative:  HPI on 05/23/2017 by Dr. Jennette [name] Nicole [name] is a 83 y.o. female with medical history significant of vascular dementia, A.Fib, accelerated HTN, CNS amyloid bleeds in June of this year.  Patient brought in by family yesterday due to ? Chest pain, although some of the notes make it sound more like an adult failure to thrive issue with 14lb weight loss in past 2 weeks since son died. She was started on labetalol yesterday in ED for SBP 200, discharged home. Today in addition to not eating, she essentially didn't even get out of bed.  Family notes foul smelling urine and suspects UTI.  Assessment & Plan   Patient admitted earlier today by Dr. Jennette [name]. See H&P for details  Acute metabolic encephalopathy -Possibly secondary to urinary tract infection versus worsening dementia versus possible CVA -UA many bacteria, 6-30 WBCs, negative nitrites leukocytes -Urine culture pending -MRI brain: No acute reversible finding on MRI. -TSH 0.240, will obtain FT4  Possible urinary tract infection -UA as above  Vascular dementia -Continue Aricept if patient is able to tolerate oral medications  Essential hypertension -continue labetalol, BiDil   Hypothyroidism  -Continue Synthroid  -TSH as above  DVT Prophylaxis  SCDs  Code Status: DNR  Family Communication: None at bedside  Disposition Plan: Observation, continue to monitor   Consultants None  Procedures  None   LOS: 0 days   Time Spent in minutes   30 minutes  Nicole [name] D.O. on 05/23/2017 at 3:27 PM  Between 7am to 7pm - Pager - [phone]  After 7pm go to [email or web address] - password [email or web address]  And look for the night coverage person covering for me after hours  [facility] Office   [phone]

## 2017-05-23 NOTE — ED Notes (Signed)
Pt remains in MRI 

## 2017-05-23 NOTE — ED Notes (Signed)
PT to MRI

## 2017-05-23 NOTE — ED Provider Notes (Signed)
[facility] EMERGENCY DEPARTMENT Provider Note   CSN: [id number] Arrival date & time: 05/23/17  0023     History   Chief Complaint Chief Complaint  Patient presents with  . Altered Mental Status  . Possible UTI    HPI Nicole [name] is a 83 y.o. female.  The history is provided by the spouse.    LEVEL V CAVEAT:  DEMENTIA 29 y.o. F with hx of anemia, anxiety, AFIB not on anticoagulation, CKD, hx of colon cancer, dementia, gout, GERD, hypothyroidism, presenting to the ED for weakness.  History is provided by her husband who is at the bedside.  Reports they were seen in the ED yesterday-- he states he is not sure exactly what tests or diagnosis was given yesterday.  States today patient has seemed very weak and has been sleeping more than normal.  States he tried to help her up from the table after trying to feed her dinner and she was unable to stand. States she has barely eaten today and has only had a little fluids. States usually she is able to get up and walk around the house on her own, occasionally using a cane.  States he thought someone yesterday said something about a urinary tract infection, but he is not entirely sure.  She was not sent home on any new medications.  He is unsure of fever but did report that her close today felt "damp" and she had some beads of sweat on her nose.  She has not had any cough.  No falls or head trauma.  States her overall cognitive function has declined over the past few months but there is been no acute change in the past 24-48 hrs.    Past Medical History:  Diagnosis Date  . AAA (abdominal aortic aneurysm) ([place])   . Acute upper respiratory infections of unspecified site   . Anemia   . Anxiety   . Aortic aneurysm of unspecified site without mention of rupture   . Arthritis   . Atrial fibrillation ([place])   . Cervicalgia   . Cholelithiasis   . Chronic systolic heart failure ([place])   . CKD (chronic kidney disease)   . Colon  cancer ([place]) 07/1997  . Depression   . Diabetes mellitus ([place])   . Dizziness and giddiness   . Electrolyte and fluid disorders not elsewhere classified   . GERD (gastroesophageal reflux disease)   . Gout, unspecified   . Hemorrhage of rectum and anus   . Hypercholesteremia   . Hypertension   . Hypoglycemia, unspecified   . Hypopotassemia   . Hypothyroidism   . Kidney stone    renal calculi  . Malignant neoplasm of colon, unspecified site   . Other chronic allergic conjunctivitis   . Other specified cardiac dysrhythmias(427.89)   . Pain in joint, lower leg   . Perforation of bile duct   . Proteinuria   . Shortness of breath   . Small bowel obstruction due to adhesions ([place]) 05/16/2012  . Stroke ([place])   . Swelling, mass, or lump in head and neck   . Thoracic aneurysm without mention of rupture    4.6 cm Asc Aortic aneurysm, CT 07/2015    Patient Active Problem List   Diagnosis Date Noted  . Palliative care by specialist   . DNR (do not resuscitate)   . Bradyarrhythmia 01/11/2017  . Generalized weakness 01/11/2017  . General weakness 01/11/2017  . PE (pulmonary thromboembolism) ([place]) 08/24/2016  .  Pulmonary embolus ([place])   . Accelerated hypertension with diastolic congestive heart failure, NYHA class 3 ([place]) 01/05/2016  . Permanent atrial fibrillation ([place]) 01/05/2016  . Rib pain on right side 10/14/2015  . Hypothyroidism due to acquired atrophy of thyroid 10/14/2015  . Type 2 diabetes mellitus with diabetic nephropathy, without long-term current use of insulin ([place]) 10/14/2015  . Essential hypertension 10/14/2015  . Acute on chronic systolic heart failure ([place]) 08/02/2015  . Acute on chronic diastolic heart failure ([place]) 08/02/2015  . Bradycardia 07/27/2015  . Chronic anemia 07/27/2015  . Chest pain   . Vascular dementia 06/13/2015  . Memory loss 06/13/2015  . Left hemiparesis ([place])   . Chronic diastolic CHF (congestive heart failure), NYHA class 2 ([place])   .  Persistent atrial fibrillation ([place])   . Type 2 diabetes mellitus with peripheral neuropathy (H[facility])   . ICH (intracerebral hemorrhage) ([place]) 04/04/2015  . Chronic atrial fibrillation ([place])   . Hypertensive urgency   . SOB (shortness of breath) 04/03/2014  . Diabetic nephropathy ([place]) 09/07/2013  . Chronic constipation 03/25/2013  . Carpal tunnel syndrome 02/25/2013  . Hypokalemia 05/17/2012  . Normal coronary arteries, 2008 05/15/2012  . Type II or unspecified type diabetes mellitus with neurological manifestations, not stated as uncontrolled(250.60) 05/15/2012  . HTN (hypertension) 05/15/2012  . Acute respiratory distress 05/15/2012  . Abdominal pain, acute, right upper quadrant 05/12/2012  . Cholecystitis chronic, acute, S/P lap cholecystectomy 05/14/12 05/12/2012  . Thoracic aortic aneurysm without rupture ([place])   . CHF, past NICM with an EF of 30%, most recent echo 2011 showed EF to be45- 50%   . Anemia 08/09/2011  . History of colon cancer, stage III 08/09/2011    Past Surgical History:  Procedure Laterality Date  . CARDIAC CATHETERIZATION  2008   moderate severe pulm HTN; with elevted pulm capillary wedge pressure   . CHOLECYSTECTOMY  05/14/2012   Procedure: LAPAROSCOPIC CHOLECYSTECTOMY WITH INTRAOPERATIVE CHOLANGIOGRAM;  Surgeon: Haywood Lasso, MD;  Location: [facility];  Service: General;  Laterality: N/A;  . COLON SURGERY     to remove colon ca  . ERCP  05/17/2012   Procedure: ENDOSCOPIC RETROGRADE CHOLANGIOPANCREATOGRAPHY (ERCP);  Surgeon: Missy Sabins, MD;  Location: [facility];  Service: Gastroenterology;  Laterality: N/A;  . ERCP  08/12/2012   Procedure: ENDOSCOPIC RETROGRADE CHOLANGIOPANCREATOGRAPHY (ERCP);  Surgeon: Missy Sabins, MD;  Location: [facility] ENDOSCOPY;  Service: Endoscopy;  Laterality: N/A;  . HEMICOLECTOMY  08/11/1997  . KIDNEY STONE SURGERY    . TRANSTHORACIC ECHOCARDIOGRAM  11/2009   EF 45-50%, mild-mod AV regurg; mild MR; mod TR; ascending aorta mildly dilated     OB History    No data available       Home Medications    Prior to Admission medications   Medication Sig Start Date End Date Taking? Authorizing Provider  allopurinol (ZYLOPRIM) 100 MG tablet TAKE 1 TABLET BY MOUTH  DAILY Patient taking differently: TAKE 100 mg TABLET BY MOUTH  DAILY 12/24/16   [name], [name], DO  diltiazem (CARDIZEM CD) 120 MG 24 hr capsule Take 1 capsule (120 mg total) by mouth daily. Patient not taking: Reported on 05/21/2017 01/19/17   Barton Dubois, MD  donepezil (ARICEPT) 10 MG tablet Take one tablet by mouth once daily at bedtime 02/18/17   [name], [name], DO  ferrous sulfate 325 (65 FE) MG EC tablet Take 1 tablet (325 mg total) by mouth 2 (two) times daily with a meal. 02/01/17   Dewaine Oats, Carlos American,  NP  furosemide (LASIX) 20 MG tablet Take 1 tablet (20 mg) by mouth every other day, may also take one additional tablet daily for weight gain greater than 3 lbs. 09/03/16   [name], [name], DO  isosorbide mononitrate (IMDUR) 30 MG 24 hr tablet Take 2 tablet by mouth three times daily. 05/02/17   Lauree Chandler, NP  isosorbide-hydrALAZINE (BIDIL) 20-37.5 MG tablet Take 2 tablets by mouth 3 (three) times daily. Patient not taking: Reported on 05/21/2017 01/18/17   Barton Dubois, MD  labetalol (NORMODYNE) 200 MG tablet Take 1 tablet (200 mg total) by mouth 2 (two) times daily. 05/21/17 06/20/17  Fatima Blank, MD  levothyroxine (SYNTHROID, LEVOTHROID) 50 MCG tablet TAKE 1 TABLET BY MOUTH  DAILY BEFORE BREAKFAST 03/26/17   Gildardo Cranker, DO  lisinopril (PRINIVIL,ZESTRIL) 20 MG tablet Take 1 tablet (20 mg total) by mouth daily. Patient not taking: Reported on 05/21/2017 01/19/17   Barton Dubois, MD  potassium chloride (K-DUR) 10 MEQ tablet Take 1 tablet (10 mEq total) by mouth every other day. 09/03/16   [name], [name], DO  potassium chloride SA (K-DUR,KLOR-CON) 20 MEQ tablet Take 1 tablet (20 mEq total) by mouth 2 (two) times daily. 05/21/17 06/04/17  Fatima Blank, MD  sertraline (ZOLOFT) 50 MG tablet Take 1 tablet (50 mg total) by mouth daily. 03/04/17   Lauree Chandler, NP    Family History Family History  Problem Relation Age of Onset  . Heart disease Mother   . Stroke Father   . Hypertension Daughter   . Hypertension Son   . Diabetes Sister   . Hypertension Son     Social History Social History  Substance Use Topics  . Smoking status: Never Smoker  . Smokeless tobacco: Never Used  . Alcohol use No     Allergies   Iohexol; Iodinated diagnostic agents; and Z-pak [azithromycin]   Review of Systems Review of Systems  Unable to perform ROS: Other     Physical Exam Updated Vital Signs BP (!) 156/100 (BP Location: Right Arm)   Pulse (!) 102   Resp 16   Ht 5\' 2"  (1.575 m)   Wt 60.8 kg (134 lb)   SpO2 95%   BMI 24.51 kg/m   Physical Exam  Constitutional: She appears well-developed and well-nourished.  elderly  HENT:  Head: Normocephalic and atraumatic.  Mouth/Throat: Oropharynx is clear and moist.  Eyes: Pupils are equal, round, and reactive to light. Conjunctivae and EOM are normal.  Neck: Normal range of motion.  Cardiovascular: Normal heart sounds.  An irregularly irregular rhythm present. Tachycardia present.   AFIB  Pulmonary/Chest: Effort normal and breath sounds normal. No respiratory distress. She has no wheezes. She has no rhonchi.  Abdominal: Soft. Bowel sounds are normal. There is no tenderness. There is no rebound.  Soft, no apparent tenderness  Musculoskeletal: Normal range of motion.  Neurological:  Appears sleepy but arousable to verbal and tactile stimuli; she does not follow commands when prompted, does not provide any verbal responses  Skin: Skin is warm and dry.  Psychiatric: She has a normal mood and affect.  Nursing note and vitals reviewed.    ED Treatments / Results  Labs (all labs ordered are listed, but only abnormal results are displayed) Labs Reviewed  CBC WITH  DIFFERENTIAL/PLATELET - Abnormal; Notable for the following:       Result Value   MCV 74.0 (*)    MCH 25.6 (*)    RDW 16.5 (*)  All other components within normal limits  BASIC METABOLIC PANEL - Abnormal; Notable for the following:    Glucose, Bld 151 (*)    Creatinine, Ser 1.78 (*)    Calcium 8.7 (*)    GFR calc non Af Amer 25 (*)    GFR calc Af Amer 29 (*)    All other components within normal limits  URINALYSIS, ROUTINE W REFLEX MICROSCOPIC - Abnormal; Notable for the following:    Color, Urine AMBER (*)    APPearance HAZY (*)    Bilirubin Urine SMALL (*)    Protein, ur >=300 (*)    Bacteria, UA MANY (*)    Squamous Epithelial / LPF 0-5 (*)    All other components within normal limits  I-STAT CG4 LACTIC ACID, ED - Abnormal; Notable for the following:    Lactic Acid, Venous 2.63 (*)    All other components within normal limits  I-STAT CG4 LACTIC ACID, ED - Abnormal; Notable for the following:    Lactic Acid, Venous 2.69 (*)    All other components within normal limits  URINE CULTURE  TSH  I-STAT TROPONIN, ED    EKG  EKG Interpretation  Date/Time:  Thursday May 23 2017 02:05:50 EDT Ventricular Rate:  105 PR Interval:    QRS Duration: 118 QT Interval:  404 QTC Calculation: 534 R Axis:   75 Text Interpretation:  Atrial fibrillation LVH with secondary repolarization abnormality Prolonged QT interval No significant change since last tracing Confirmed by Ripley Fraise [phone]) on 05/23/2017 2:51:36 AM       Radiology Dg Chest 2 View  Result Date: 05/23/2017 CLINICAL DATA:  Shortness of breath EXAM: CHEST  2 VIEW COMPARISON:  Chest radiograph 05/21/2017 FINDINGS: Massive cardiomegaly is unchanged with prominence of the pulmonary artery. There is calcific aortic atherosclerosis. Left mid lung pulmonary nodular opacities are unchanged. No focal airspace consolidation or pulmonary edema. Small right pleural effusion. IMPRESSION: 1. Cardiomegaly and enlarged pulmonary  artery with aortic atherosclerosis (ICD10-I70.0). 2. Small right pleural effusion, unchanged. Electronically Signed   By: Ulyses Jarred M.D.   On: 05/23/2017 01:43   Dg Chest 2 View  Result Date: 05/21/2017 CLINICAL DATA:  RIGHT-sided chest pain began 3 days ago. EXAM: CHEST  2 VIEW COMPARISON:  01/11/2017. FINDINGS: Cardiomegaly. RIGHT pleural effusion. No consolidation or pulmonary edema. No pneumothorax. Thoracic atherosclerosis. Similar appearance to priors. No acute osseous findings. IMPRESSION: RIGHT pleural effusion similar to priors. Cardiomegaly. No consolidation or edema. Electronically Signed   By: Staci Righter M.D.   On: 05/21/2017 19:17   Ct Head Wo Contrast  Result Date: 05/23/2017 CLINICAL DATA:  Weakness.  History of colon cancer. EXAM: CT HEAD WITHOUT CONTRAST TECHNIQUE: Contiguous axial images were obtained from the base of the skull through the vertex without intravenous contrast. COMPARISON:  Brain MRI 01/12/2017 FINDINGS: Brain: No mass lesion, intraparenchymal hemorrhage or extra-axial collection. No evidence of acute cortical infarct. There is periventricular hypoattenuation compatible with chronic microvascular disease. 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 Vascular: No hyperdense vessel or unexpected calcification. Skull: Normal visualized skull base, calvarium and extracranial soft tissues. Sinuses/Orbits: No sinus fluid levels or advanced mucosal thickening. No mastoid effusion. Normal orbits. 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 Electronically Signed   By: Ulyses Jarred M.D.   On: 05/23/2017 04:10    Procedures Procedures (including critical care time)  Medications Ordered in ED Medications - No data to display   Initial Impression / Assessment and Plan /  ED Course  I have reviewed the triage vital signs and the nursing notes.  Pertinent labs & imaging results that were  available during my care of the patient were reviewed by me and considered in my medical decision making (see chart for details).  83 year old female presenting to the ED with husband for generalized weakness.  Patient seen in the ED yesterday for chest pain with negative workup.  She was hypertensive, labetalol was restarted.  Husband states today she has had poor oral intake and was not able to get up and walk around which she usually does.  He reports she does have dementia and her cognitive abilities have declined over the past few months but there is been no drastic change over the past 24-48 hours.  On exam patient does appear weak and clinically dry.  She does appear confused and is not able to follow any commands when prompted.  She does not endorse any pain.  Her vitals are stable.  We will plan for screening labs, EKG, chest x-ray, urinalysis.  Will give IVF.  Patients screening lab work with slight bump in [place] from yesterday.  Lactate 2.63, normal WBC count.  UA with many bacteria, 6-30 WBC's.  Will send for culture.  CXR clear.  Patient still very weak appearing here.  She has not been able to get up and move around.  I do feel she will require admission.  Discussed with Dr. Alcario Drought who requested CT of her head which was ordered and is negative.  He will admit for ongoing care.  Final Clinical Impressions(s) / ED Diagnoses   Final diagnoses:  Acute cystitis without hematuria  Weakness    New Prescriptions New Prescriptions   No medications on file     Larene Pickett, PA-C 05/23/17 1914    Ripley Fraise, MD 05/23/17 [phone]

## 2017-05-23 NOTE — ED Provider Notes (Signed)
Patient seen/examined in the Emergency Department in conjunction with Midlevel Provider [facility] Patient presents with altered mental status, worse from baseline Exam : pt resting with eyes closed but will open eyes to voice but appears confused.  Otherwise, no distress noted Plan: workup initiated, UTI suspected     Ripley Fraise, MD 05/23/17 [phone]

## 2017-05-23 NOTE — ED Notes (Addendum)
Report given to [facility] on floor.

## 2017-05-23 NOTE — ED Triage Notes (Signed)
[facility] advised the patient is coming from home after family advised the patient woke up altered. [facility] advised family advised the patient is more altered then normal. [facility] advised that family is also reporting that the urine has a foul odor.   Pt is unable to answer questions. Family at bedside advised the pt was just seen yesterday for a UTI. Husband advised the pt is actually here for weakness and he is unable take care of her.

## 2017-05-23 NOTE — Evaluation (Signed)
Clinical/Bedside Swallow Evaluation Patient Details  Name: Nicole [name] MRN: [id number] Date of Birth: 1930/12/11  Today's Date: 05/23/2017 Time: SLP Start Time (ACUTE ONLY): 1101 SLP Stop Time (ACUTE ONLY): 1125 SLP Time Calculation (min) (ACUTE ONLY): 24 min  Past Medical History:  Past Medical History:  Diagnosis Date  . AAA (abdominal aortic aneurysm) ([place])   . Acute upper respiratory infections of unspecified site   . Anemia   . Anxiety   . Aortic aneurysm of unspecified site without mention of rupture   . Arthritis   . Atrial fibrillation ([place])   . Cervicalgia   . Cholelithiasis   . Chronic systolic heart failure ([place])   . CKD (chronic kidney disease)   . Colon cancer ([place]) 07/1997  . Depression   . Diabetes mellitus ([place])   . Dizziness and giddiness   . Electrolyte and fluid disorders not elsewhere classified   . GERD (gastroesophageal reflux disease)   . Gout, unspecified   . Hemorrhage of rectum and anus   . Hypercholesteremia   . Hypertension   . Hypoglycemia, unspecified   . Hypopotassemia   . Hypothyroidism   . Kidney stone    renal calculi  . Malignant neoplasm of colon, unspecified site   . Other chronic allergic conjunctivitis   . Other specified cardiac dysrhythmias(427.89)   . Pain in joint, lower leg   . Perforation of bile duct   . Proteinuria   . Shortness of breath   . Small bowel obstruction due to adhesions ([place]) 05/16/2012  . Stroke ([place])   . Swelling, mass, or lump in head and neck   . Thoracic aneurysm without mention of rupture    4.6 cm Asc Aortic aneurysm, CT 07/2015   Past Surgical History:  Past Surgical History:  Procedure Laterality Date  . CARDIAC CATHETERIZATION  2008   moderate severe pulm HTN; with elevted pulm capillary wedge pressure   . CHOLECYSTECTOMY  05/14/2012   Procedure: LAPAROSCOPIC CHOLECYSTECTOMY WITH INTRAOPERATIVE CHOLANGIOGRAM;  Surgeon: Haywood Lasso, MD;  Location: [facility];  Service: General;   Laterality: N/A;  . COLON SURGERY     to remove colon ca  . ERCP  05/17/2012   Procedure: ENDOSCOPIC RETROGRADE CHOLANGIOPANCREATOGRAPHY (ERCP);  Surgeon: Missy Sabins, MD;  Location: [facility];  Service: Gastroenterology;  Laterality: N/A;  . ERCP  08/12/2012   Procedure: ENDOSCOPIC RETROGRADE CHOLANGIOPANCREATOGRAPHY (ERCP);  Surgeon: Missy Sabins, MD;  Location: [facility] ENDOSCOPY;  Service: Endoscopy;  Laterality: N/A;  . HEMICOLECTOMY  08/11/1997  . KIDNEY STONE SURGERY    . TRANSTHORACIC ECHOCARDIOGRAM  11/2009   EF 45-50%, mild-mod AV regurg; mild MR; mod TR; ascending aorta mildly dilated   HPI:  Nicole Kagan Butleris a 83 y.o.femalewith medical history significant of vascular dementia, CVA, A.Fib, accelerated HTN, CNS amyloid bleeds in June of this year. Patient brought in by family yesterday due to ? Chest pain, although some of the notes make it sound more like an adult failure to thrive issue with 14lb weight loss in past 2 weeks since son died. Dx with acute encephalopathy. Differential diagnosis include delirium due to UTI, stroke, worsening dementia, depression. MRI results pending. MBS in 2016 recommended dysphagia 3 with thin liquid; trace penetration of thin liquid via straw. Most recent bedside swallow eval complete 12/2016 recommended regular diet, thin liquid, no straws given s/s of aspiration noted at bedside.    Assessment / Plan / Recommendation Clinical Impression  Swallowing function appears consistent with previous bedside and  modified barium swallow studies. Patient with prolonged mastication of solids, likely due to altered mentation as oral motor exam unremarkable. Throat clearing noted post swallow with thin liquids via straw suggestive of penetration which likely clears. Cup sips of thin liquid consumed without overt indication of aspiration. Will initiate a po diet and f/u for tolerance.  SLP Visit Diagnosis: Dysphagia, oropharyngeal phase (R13.12)    Aspiration Risk  Mild  aspiration risk    Diet Recommendation Dysphagia 2 (Fine chop);Thin liquid   Liquid Administration via: Cup;No straw Medication Administration: Crushed with puree Supervision: Patient able to self feed;Full supervision/cueing for compensatory strategies Compensations: Slow rate;Small sips/bites Postural Changes: Seated upright at 90 degrees    Other  Recommendations Oral Care Recommendations: Oral care BID   Follow up Recommendations None      Frequency and Duration min 2x/week  1 week       Prognosis Prognosis for Safe Diet Advancement: Good Barriers to Reach Goals: Cognitive deficits      Swallow Study   General HPI: Nicole Westrich Butleris a 83 y.o.femalewith medical history significant of vascular dementia, CVA, A.Fib, accelerated HTN, CNS amyloid bleeds in June of this year. Patient brought in by family yesterday due to ? Chest pain, although some of the notes make it sound more like an adult failure to thrive issue with 14lb weight loss in past 2 weeks since son died. Dx with acute encephalopathy. Differential diagnosis include delirium due to UTI, stroke, worsening dementia, depression. MRI results pending. MBS in 2016 recommended dysphagia 3 with thin liquid; trace penetration of thin liquid via straw. Most recent bedside swallow eval complete 12/2016 recommended regular diet, thin liquid, no straws given s/s of aspiration noted at bedside.  Type of Study: Bedside Swallow Evaluation Previous Swallow Assessment: see HPI Diet Prior to this Study: NPO Temperature Spikes Noted: No Respiratory Status: Room air History of Recent Intubation: No Behavior/Cognition: Alert;Requires cueing Oral Cavity Assessment: Dry Oral Care Completed by SLP: Recent completion by staff Oral Cavity - Dentition: Adequate natural dentition Vision: Functional for self-feeding Self-Feeding Abilities: Able to feed self Patient Positioning: Upright in bed Baseline Vocal Quality: Normal Volitional  Cough: Strong Volitional Swallow: Able to elicit    Oral/Motor/Sensory Function Overall Oral Motor/Sensory Function: Within functional limits   Ice Chips Ice chips: Within functional limits Presentation: Spoon   Thin Liquid Thin Liquid: Impaired Presentation: Straw Pharyngeal  Phase Impairments: Throat Clearing - Immediate    Nectar Thick Nectar Thick Liquid: Not tested   Honey Thick Honey Thick Liquid: Not tested   Puree Puree: Within functional limits Presentation: Spoon   Solid   GO   Solid: Impaired Presentation: Self Fed Oral Phase Impairments: Impaired mastication Oral Phase Functional Implications: Prolonged oral transit    Functional Assessment Tool Used: skilled clinical judgement  Functional Limitations: Swallowing Swallow Current Status ([id number]): At least 20 percent but less than 40 percent impaired, limited or restricted Swallow Goal Status [phone]): At least 1 percent but less than 20 percent impaired, limited or restricted  [place], CCC-SLP [phone]  Nicole [name] 05/23/2017,12:25 PM

## 2017-05-23 NOTE — H&P (Signed)
History and Physical    Nicole [name] HBZ:[id number] DOB: 1931/07/15 DOA: 05/23/2017  PCP: [name], DO  Patient coming from: Home  I have personally briefly reviewed patient's old medical records in [place]  Chief Complaint: AMS  HPI: Nicole [name] is a 83 y.o. female with medical history significant of vascular dementia, A.Fib, accelerated HTN, CNS amyloid bleeds in June of this year.  Patient brought in by family yesterday due to ? Chest pain, although some of the notes make it sound more like an adult failure to thrive issue with 14lb weight loss in past 2 weeks since son died.  She was started on labetalol yesterday in ED for SBP 200, discharged home.  Today in addition to not eating, she essentially didn't even get out of bed.  Family notes foul smelling urine and suspects UTI.   ED Course: UA is equivocal at best for UTI: 6-30 WBC, many bacteria, but neg leuk esterase, neg nitrites.  Started on rocephin.  CT head performed at my request and is negative.   Review of Systems: Unable to perform  Past Medical History:  Diagnosis Date  . AAA (abdominal aortic aneurysm) ([place])   . Acute upper respiratory infections of unspecified site   . Anemia   . Anxiety   . Aortic aneurysm of unspecified site without mention of rupture   . Arthritis   . Atrial fibrillation ([place])   . Cervicalgia   . Cholelithiasis   . Chronic systolic heart failure ([place])   . CKD (chronic kidney disease)   . Colon cancer ([place]) 07/1997  . Depression   . Diabetes mellitus ([place])   . Dizziness and giddiness   . Electrolyte and fluid disorders not elsewhere classified   . GERD (gastroesophageal reflux disease)   . Gout, unspecified   . Hemorrhage of rectum and anus   . Hypercholesteremia   . Hypertension   . Hypoglycemia, unspecified   . Hypopotassemia   . Hypothyroidism   . Kidney stone    renal calculi  . Malignant neoplasm of colon, unspecified site   . Other chronic  allergic conjunctivitis   . Other specified cardiac dysrhythmias(427.89)   . Pain in joint, lower leg   . Perforation of bile duct   . Proteinuria   . Shortness of breath   . Small bowel obstruction due to adhesions ([place]) 05/16/2012  . Stroke ([place])   . Swelling, mass, or lump in head and neck   . Thoracic aneurysm without mention of rupture    4.6 cm Asc Aortic aneurysm, CT 07/2015    Past Surgical History:  Procedure Laterality Date  . CARDIAC CATHETERIZATION  2008   moderate severe pulm HTN; with elevted pulm capillary wedge pressure   . CHOLECYSTECTOMY  05/14/2012   Procedure: LAPAROSCOPIC CHOLECYSTECTOMY WITH INTRAOPERATIVE CHOLANGIOGRAM;  Surgeon: Haywood Lasso, MD;  Location: [facility];  Service: General;  Laterality: N/A;  . COLON SURGERY     to remove colon ca  . ERCP  05/17/2012   Procedure: ENDOSCOPIC RETROGRADE CHOLANGIOPANCREATOGRAPHY (ERCP);  Surgeon: Missy Sabins, MD;  Location: [facility];  Service: Gastroenterology;  Laterality: N/A;  . ERCP  08/12/2012   Procedure: ENDOSCOPIC RETROGRADE CHOLANGIOPANCREATOGRAPHY (ERCP);  Surgeon: Missy Sabins, MD;  Location: [facility] ENDOSCOPY;  Service: Endoscopy;  Laterality: N/A;  . HEMICOLECTOMY  08/11/1997  . KIDNEY STONE SURGERY    . TRANSTHORACIC ECHOCARDIOGRAM  11/2009   EF 45-50%, mild-mod AV regurg; mild MR; mod TR; ascending  aorta mildly dilated     reports that she has never smoked. She has never used smokeless tobacco. She reports that she does not drink alcohol or use drugs.  Allergies  Allergen Reactions  . Iohexol Nausea And Vomiting  . Iodinated Diagnostic Agents Nausea And Vomiting  . Z-Pak [Azithromycin]     Reaction unknown by family    Family History  Problem Relation Age of Onset  . Heart disease Mother   . Stroke Father   . Hypertension Daughter   . Hypertension Son   . Diabetes Sister   . Hypertension Son      Prior to Admission medications   Medication Sig Start Date End Date Taking? Authorizing  Provider  allopurinol (ZYLOPRIM) 100 MG tablet TAKE 1 TABLET BY MOUTH  DAILY Patient taking differently: TAKE 100 mg TABLET BY MOUTH  DAILY 12/24/16  Yes [name], [name], DO  donepezil (ARICEPT) 10 MG tablet Take one tablet by mouth once daily at bedtime 02/18/17  Yes [name], [name], DO  ferrous sulfate 325 (65 FE) MG EC tablet Take 1 tablet (325 mg total) by mouth 2 (two) times daily with a meal. 02/01/17  Yes Lauree Chandler, NP  furosemide (LASIX) 20 MG tablet Take 1 tablet (20 mg) by mouth every other day, may also take one additional tablet daily for weight gain greater than 3 lbs. 09/03/16  Yes [name], [name], DO  labetalol (NORMODYNE) 200 MG tablet Take 1 tablet (200 mg total) by mouth 2 (two) times daily. 05/21/17 06/20/17 Yes Cardama, Grayce Sessions, MD  levothyroxine (SYNTHROID, LEVOTHROID) 50 MCG tablet TAKE 1 TABLET BY MOUTH  DAILY BEFORE BREAKFAST 03/26/17  Yes Gildardo Cranker, DO  potassium chloride (K-DUR) 10 MEQ tablet Take 1 tablet (10 mEq total) by mouth every other day. 09/03/16  Yes [name], [name], DO  sertraline (ZOLOFT) 50 MG tablet Take 1 tablet (50 mg total) by mouth daily. 03/04/17  Yes Lauree Chandler, NP  potassium chloride SA (K-DUR,KLOR-CON) 20 MEQ tablet Take 1 tablet (20 mEq total) by mouth 2 (two) times daily. 05/21/17 06/04/17  Fatima Blank, MD    Physical Exam: Vitals:   05/23/17 0045 05/23/17 0204 05/23/17 0230 05/23/17 0330  BP:   (!) 152/77 (!) 144/92  Pulse:   74 (!) 59  Resp:   (!) 21 19  Temp:  (S) 97.6 F (36.4 C)    TempSrc:  (S) Rectal    SpO2:   100% 100%  Weight: 60.8 kg (134 lb)     Height: 5\' 2"  (1.575 m)       Constitutional: Lethargic, will open one eye to voice, Eyes: PERRL, lids and conjunctivae normal ENMT: Mucous membranes are moist. Posterior pharynx clear of any exudate or lesions.Normal dentition.  Neck: normal, supple, no masses, no thyromegaly Respiratory: clear to auscultation bilaterally, no wheezing, no crackles.  Normal respiratory effort. No accessory muscle use.  Cardiovascular: Regular rate and rhythm, no murmurs / rubs / gallops. No extremity edema. 2+ pedal pulses. No carotid bruits.  Abdomen: no tenderness, no masses palpated. No hepatosplenomegaly. Bowel sounds positive.  Musculoskeletal: no clubbing / cyanosis. No joint deformity upper and lower extremities. Good ROM, no contractures. Normal muscle tone.  Skin: no rashes, lesions, ulcers. No induration Neurologic: Not obeying commands, no verbal, will open one eye to voice. Psychiatric: Unable to assess.   Labs on Admission: I have personally reviewed following labs and imaging studies  CBC:  Recent Labs Lab 05/21/17 1818 05/23/17  0156  WBC 6.7 7.5  NEUTROABS  --  6.2  HGB 13.2 13.0  HCT 37.9 37.5  MCV 74.0* 74.0*  PLT 247 829   Basic Metabolic Panel:  Recent Labs Lab 05/21/17 1818 05/23/17 0156  NA 141 143  K 3.1* 3.7  CL 104 106  CO2 25 26  GLUCOSE 165* 151*  BUN 12 20  CREATININE 1.33* 1.78*  CALCIUM 8.9 8.7*   GFR: Estimated Creatinine Clearance: 20.2 mL/min (A) (by C-G formula based on SCr of 1.78 mg/dL (H)). Liver Function Tests: No results for input(s): AST, ALT, ALKPHOS, BILITOT, PROT, ALBUMIN in the last 168 hours. No results for input(s): LIPASE, AMYLASE in the last 168 hours. No results for input(s): AMMONIA in the last 168 hours. Coagulation Profile: No results for input(s): INR, PROTIME in the last 168 hours. Cardiac Enzymes: No results for input(s): CKTOTAL, CKMB, CKMBINDEX, TROPONINI in the last 168 hours. BNP (last 3 results) No results for input(s): PROBNP in the last 8760 hours. HbA1C: No results for input(s): HGBA1C in the last 72 hours. CBG: No results for input(s): GLUCAP in the last 168 hours. Lipid Profile: No results for input(s): CHOL, HDL, LDLCALC, TRIG, CHOLHDL, LDLDIRECT in the last 72 hours. Thyroid Function Tests: No results for input(s): TSH, T4TOTAL, FREET4, T3FREE, THYROIDAB in  the last 72 hours. Anemia Panel: No results for input(s): VITAMINB12, FOLATE, FERRITIN, TIBC, IRON, RETICCTPCT in the last 72 hours. Urine analysis:    Component Value Date/Time   COLORURINE AMBER (A) 05/23/2017 0156   APPEARANCEUR HAZY (A) 05/23/2017 0156   LABSPEC 1.026 05/23/2017 0156   PHURINE 5.0 05/23/2017 0156   GLUCOSEU NEGATIVE 05/23/2017 0156   HGBUR NEGATIVE 05/23/2017 0156   BILIRUBINUR SMALL (A) 05/23/2017 0156   KETONESUR NEGATIVE 05/23/2017 0156   PROTEINUR >=300 (A) 05/23/2017 0156   UROBILINOGEN 2.0 (H) 04/13/2015 1844   NITRITE NEGATIVE 05/23/2017 0156   LEUKOCYTESUR NEGATIVE 05/23/2017 0156    Radiological Exams on Admission: Dg Chest 2 View  Result Date: 05/23/2017 CLINICAL DATA:  Shortness of breath EXAM: CHEST  2 VIEW COMPARISON:  Chest radiograph 05/21/2017 FINDINGS: Massive cardiomegaly is unchanged with prominence of the pulmonary artery. There is calcific aortic atherosclerosis. Left mid lung pulmonary nodular opacities are unchanged. No focal airspace consolidation or pulmonary edema. Small right pleural effusion. IMPRESSION: 1. Cardiomegaly and enlarged pulmonary artery with aortic atherosclerosis (ICD10-I70.0). 2. Small right pleural effusion, unchanged. Electronically Signed   By: Ulyses Jarred M.D.   On: 05/23/2017 01:43   Dg Chest 2 View  Result Date: 05/21/2017 CLINICAL DATA:  RIGHT-sided chest pain began 3 days ago. EXAM: CHEST  2 VIEW COMPARISON:  01/11/2017. FINDINGS: Cardiomegaly. RIGHT pleural effusion. No consolidation or pulmonary edema. No pneumothorax. Thoracic atherosclerosis. Similar appearance to priors. No acute osseous findings. IMPRESSION: RIGHT pleural effusion similar to priors. Cardiomegaly. No consolidation or edema. Electronically Signed   By: Staci Righter M.D.   On: 05/21/2017 19:17   Ct Head Wo Contrast  Result Date: 05/23/2017 CLINICAL DATA:  Weakness.  History of colon cancer. EXAM: CT HEAD WITHOUT CONTRAST TECHNIQUE:  Contiguous axial images were obtained from the base of the skull through the vertex without intravenous contrast. COMPARISON:  Brain MRI 01/12/2017 FINDINGS: Brain: No mass lesion, intraparenchymal hemorrhage or extra-axial collection. No evidence of acute cortical infarct. There is periventricular hypoattenuation compatible with chronic microvascular disease. 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 Vascular: No hyperdense vessel or unexpected calcification. Skull: Normal visualized skull base, calvarium and extracranial soft tissues. Sinuses/Orbits:  No sinus fluid levels or advanced mucosal thickening. No mastoid effusion. Normal orbits. 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 Electronically Signed   By: Ulyses Jarred M.D.   On: 05/23/2017 04:10    EKG: Independently reviewed.  Assessment/Plan Principal Problem:   Acute encephalopathy Active Problems:   Vascular dementia   Accelerated hypertension with diastolic congestive heart failure, NYHA class 3 (HCC)   Acute lower UTI    1. Acute encephalopathy - 1. DDx includes 1. Delirium due to UTI 2. Stroke 3. Worsening dementia 4. Depression due to loss of son x2 weeks ago 2. MRI brain ordered to r/o acute stroke 3. Will empirically treat UTI 4. IVF: NS at 100 cc/hr 5. Checking TSH 6. May wish to involve pal care early, she had a fairly poor prognosis even back in June on discharge 2. ? UTI - 1. Empiric rocephin 2. Cultures pending 3. Vascular dementia - 1. Will try and continue Aricept if she wakes up enough to take POs 4. HTN - 1. Will try to keep her on PO labetalol and Bidil started in June if she wakes up enough to take POs. 2. Will put in for PRN labetalol 5. Hypothyroidism - convert synthroid to IV  DVT prophylaxis: SCDs given h/o brain bleed in June Code Status: DNR Family Communication: No family in room Disposition Plan: TBD Consults  called: None Admission status: Place in [place], [place] [facility] Pager [phone]  If 7AM-7PM, please contact day team taking care of patient [email or web address] Password [email or web address]  05/23/2017, 5:02 AM

## 2017-05-24 DIAGNOSIS — I4891 Unspecified atrial fibrillation: Secondary | ICD-10-CM | POA: Diagnosis not present

## 2017-05-24 DIAGNOSIS — N390 Urinary tract infection, site not specified: Secondary | ICD-10-CM | POA: Diagnosis not present

## 2017-05-24 DIAGNOSIS — E039 Hypothyroidism, unspecified: Secondary | ICD-10-CM | POA: Diagnosis not present

## 2017-05-24 DIAGNOSIS — Z888 Allergy status to other drugs, medicaments and biological substances status: Secondary | ICD-10-CM | POA: Diagnosis not present

## 2017-05-24 DIAGNOSIS — I509 Heart failure, unspecified: Secondary | ICD-10-CM | POA: Diagnosis not present

## 2017-05-24 DIAGNOSIS — N183 Chronic kidney disease, stage 3 (moderate): Secondary | ICD-10-CM | POA: Diagnosis not present

## 2017-05-24 DIAGNOSIS — R531 Weakness: Secondary | ICD-10-CM | POA: Diagnosis not present

## 2017-05-24 DIAGNOSIS — N179 Acute kidney failure, unspecified: Secondary | ICD-10-CM | POA: Diagnosis not present

## 2017-05-24 DIAGNOSIS — R627 Adult failure to thrive: Secondary | ICD-10-CM | POA: Diagnosis not present

## 2017-05-24 DIAGNOSIS — K219 Gastro-esophageal reflux disease without esophagitis: Secondary | ICD-10-CM | POA: Diagnosis not present

## 2017-05-24 DIAGNOSIS — F0151 Vascular dementia with behavioral disturbance: Secondary | ICD-10-CM | POA: Diagnosis not present

## 2017-05-24 DIAGNOSIS — Z66 Do not resuscitate: Secondary | ICD-10-CM | POA: Diagnosis not present

## 2017-05-24 DIAGNOSIS — G9341 Metabolic encephalopathy: Secondary | ICD-10-CM | POA: Diagnosis not present

## 2017-05-24 DIAGNOSIS — I5030 Unspecified diastolic (congestive) heart failure: Secondary | ICD-10-CM | POA: Diagnosis not present

## 2017-05-24 DIAGNOSIS — Z85038 Personal history of other malignant neoplasm of large intestine: Secondary | ICD-10-CM | POA: Diagnosis not present

## 2017-05-24 DIAGNOSIS — Z881 Allergy status to other antibiotic agents status: Secondary | ICD-10-CM | POA: Diagnosis not present

## 2017-05-24 DIAGNOSIS — Z8673 Personal history of transient ischemic attack (TIA), and cerebral infarction without residual deficits: Secondary | ICD-10-CM | POA: Diagnosis not present

## 2017-05-24 DIAGNOSIS — E7800 Pure hypercholesterolemia, unspecified: Secondary | ICD-10-CM | POA: Diagnosis not present

## 2017-05-24 DIAGNOSIS — N3000 Acute cystitis without hematuria: Secondary | ICD-10-CM | POA: Diagnosis not present

## 2017-05-24 DIAGNOSIS — G9340 Encephalopathy, unspecified: Secondary | ICD-10-CM | POA: Diagnosis not present

## 2017-05-24 DIAGNOSIS — I130 Hypertensive heart and chronic kidney disease with heart failure and stage 1 through stage 4 chronic kidney disease, or unspecified chronic kidney disease: Secondary | ICD-10-CM | POA: Diagnosis not present

## 2017-05-24 DIAGNOSIS — I110 Hypertensive heart disease with heart failure: Secondary | ICD-10-CM | POA: Diagnosis not present

## 2017-05-24 LAB — BASIC METABOLIC PANEL
Anion gap: 8 (ref 5–15)
BUN: 23 mg/dL — ABNORMAL HIGH (ref 6–20)
CALCIUM: 8 mg/dL — AB (ref 8.9–10.3)
CHLORIDE: 111 mmol/L (ref 101–111)
CO2: 25 mmol/L (ref 22–32)
CREATININE: 1.73 mg/dL — AB (ref 0.44–1.00)
GFR calc non Af Amer: 26 mL/min — ABNORMAL LOW (ref >60)
GFR, EST AFRICAN AMERICAN: 30 mL/min — AB (ref >60)
GLUCOSE: 137 mg/dL — AB (ref 65–99)
Potassium: 3.2 mmol/L — ABNORMAL LOW (ref 3.5–5.1)
Sodium: 144 mmol/L (ref 135–145)

## 2017-05-24 LAB — CBC
HEMATOCRIT: 31 % — AB (ref 36.0–46.0)
Hemoglobin: 10.6 g/dL — ABNORMAL LOW (ref 12.0–15.0)
MCH: 25.2 pg — AB (ref 26.0–34.0)
MCHC: 34.2 g/dL (ref 30.0–36.0)
MCV: 73.8 fL — AB (ref 78.0–100.0)
Platelets: 180 10*3/uL (ref 150–400)
RBC: 4.2 MIL/uL (ref 3.87–5.11)
RDW: 16.8 % — AB (ref 11.5–15.5)
WBC: 7.1 10*3/uL (ref 4.0–10.5)

## 2017-05-24 LAB — T4, FREE: FREE T4: 1.22 ng/dL — AB (ref 0.61–1.12)

## 2017-05-24 MED ORDER — LEVOTHYROXINE SODIUM 50 MCG PO TABS
50.0000 ug | ORAL_TABLET | Freq: Every day | ORAL | Status: DC
  Administered 2017-05-25 – 2017-05-27 (×3): 50 ug via ORAL
  Filled 2017-05-24 (×3): qty 1

## 2017-05-24 NOTE — Progress Notes (Addendum)
PROGRESS NOTE    Nicole [name]  KGM:[id number] DOB: 06/02/1931 DOA: 05/23/2017 PCP: [name], DO   Chief Complaint  Patient presents with  . Altered Mental Status  . Possible UTI    Brief Narrative:  HPI on 05/23/2017 by Dr. Vladimir Creeks Butleris a 83 y.o.femalewith medical history significant of vascular dementia, A.Fib, accelerated HTN, CNS amyloid bleeds in June of this year. Patient brought in by family yesterday due to ? Chest pain, although some of the notes make it sound more like an adult failure to thrive issue with 14lb weight loss in past 2 weeks since son died. She was started on labetalol yesterday in ED for SBP 200, discharged home. Today in addition to not eating, she essentially didn't even get out of bed. Family notes foul smelling urine and suspects UTI. Assessment & Plan   Acute metabolic encephalopathy -Possibly secondary to urinary tract infection versus worsening dementia versus possible CVA -appears to have resolved- not sure what patient's baseline -UA many bacteria, 6-30 WBCs, negative nitrites leukocytes -Urine culture pending -MRI brain: No acute reversible finding on MRI. -TSH 0.240, FT4 pending   Acute kidney injury on chronic kidney disease, stage III -baseline creatinine 1.2, currently 1.73 -continue IVF and monitor BMP  Possible urinary tract infection -UA as above -pending urine culture -continue ceftriaxone  Vascular dementia -Continue Aricept   Essential hypertension -continue labetalol, BiDil   Hypothyroidism  -Continue Synthroid  -TSH as above  DVT Prophylaxis  SCDs  Code Status: DNR  Family Communication: None at bedside  Disposition Plan: Observation, continue to monitor   Consultants None  Procedures  None  Antibiotics   Anti-infectives    Start     Dose/Rate Route Frequency Ordered Stop   05/23/17 0300  cefTRIAXone (ROCEPHIN) 1 g in dextrose 5 % 50 mL IVPB     1 g 100 mL/hr over  30 Minutes Intravenous Every 24 hours 05/23/17 0251        Subjective:   Nicole [name] 83-year-old seen and examined today.  Denies pain, chest pain, shortness of breath, abdominal pain, nausea, vomiting, diarrhea, constipation. Thinks she is at my house.  Objective:   Vitals:   05/23/17 1241 05/23/17 2154 05/24/17 0530 05/24/17 0923  BP: (!) 152/94 122/86 124/76 (!) 138/97  Pulse: 97 (!) 110 82 77  Resp: 20 20 12    Temp:  98.3 F (36.8 C) 97.7 F (36.5 C)   TempSrc:  Oral Oral   SpO2: 97% 93% 95%   Weight:      Height:        Intake/Output Summary (Last 24 hours) at 05/24/17 1338 Last data filed at 05/23/17 1512  Gross per 24 hour  Intake           906.67 ml  Output                0 ml  Net           906.67 ml   Filed Weights   05/23/17 0045  Weight: 60.8 kg (134 lb)    Exam  General: Well developed, well nourished, NAD, appears stated age  103: NCAT, mucous membranes moist.   Cardiovascular: S1 S2 auscultated, irregular   Respiratory: Clear to auscultation bilaterally with equal chest rise  Abdomen: Soft, nontender, nondistended, + bowel sounds  Extremities: warm dry without cyanosis clubbing or edema  Neuro: AAOx1 (self only), otherwise nonfocal   Psych: Normal affect and demeanor    Data Reviewed:  I have personally reviewed following labs and imaging studies  CBC:  Recent Labs Lab 05/21/17 1818 05/23/17 0156 05/24/17 0347  WBC 6.7 7.5 7.1  NEUTROABS  --  6.2  --   HGB 13.2 13.0 10.6*  HCT 37.9 37.5 31.0*  MCV 74.0* 74.0* 73.8*  PLT 247 231 703   Basic Metabolic Panel:  Recent Labs Lab 05/21/17 1818 05/23/17 0156 05/24/17 0347  NA 141 143 144  K 3.1* 3.7 3.2*  CL 104 106 111  CO2 25 26 25   GLUCOSE 165* 151* 137*  BUN 12 20 23*  CREATININE 1.33* 1.78* 1.73*  CALCIUM 8.9 8.7* 8.0*   GFR: Estimated Creatinine Clearance: 20.8 mL/min (A) (by C-G formula based on SCr of 1.73 mg/dL (H)). Liver Function Tests: No results for input(s): AST,  ALT, ALKPHOS, BILITOT, PROT, ALBUMIN in the last 168 hours. No results for input(s): LIPASE, AMYLASE in the last 168 hours. No results for input(s): AMMONIA in the last 168 hours. Coagulation Profile: No results for input(s): INR, PROTIME in the last 168 hours. Cardiac Enzymes: No results for input(s): CKTOTAL, CKMB, CKMBINDEX, TROPONINI in the last 168 hours. BNP (last 3 results) No results for input(s): PROBNP in the last 8760 hours. HbA1C: No results for input(s): HGBA1C in the last 72 hours. CBG: No results for input(s): GLUCAP in the last 168 hours. Lipid Profile: No results for input(s): CHOL, HDL, LDLCALC, TRIG, CHOLHDL, LDLDIRECT in the last 72 hours. Thyroid Function Tests:  Recent Labs  05/23/17 0517  TSH 0.240*   Anemia Panel: No results for input(s): VITAMINB12, FOLATE, FERRITIN, TIBC, IRON, RETICCTPCT in the last 72 hours. Urine analysis:    Component Value Date/Time   COLORURINE AMBER (A) 05/23/2017 0156   APPEARANCEUR HAZY (A) 05/23/2017 0156   LABSPEC 1.026 05/23/2017 0156   PHURINE 5.0 05/23/2017 0156   GLUCOSEU NEGATIVE 05/23/2017 0156   HGBUR NEGATIVE 05/23/2017 0156   BILIRUBINUR SMALL (A) 05/23/2017 0156   KETONESUR NEGATIVE 05/23/2017 0156   PROTEINUR >=300 (A) 05/23/2017 0156   UROBILINOGEN 2.0 (H) 04/13/2015 1844   NITRITE NEGATIVE 05/23/2017 0156   LEUKOCYTESUR NEGATIVE 05/23/2017 0156   Sepsis Labs: @LABRCNTIP (procalcitonin:4,lacticidven:4)  ) Recent Results (from the past 240 hour(s))  Urine culture     Status: None (Preliminary result)   Collection Time: 05/23/17  1:56 AM  Result Value Ref Range Status   Specimen Description Urine  Final   Special Requests NONE  Final   Culture CULTURE REINCUBATED FOR BETTER GROWTH  Final   Report Status PENDING  Incomplete      Radiology Studies: Dg Chest 2 View  Result Date: 05/23/2017 CLINICAL DATA:  Shortness of breath EXAM: CHEST  2 VIEW COMPARISON:  Chest radiograph 05/21/2017 FINDINGS:  Massive cardiomegaly is unchanged with prominence of the pulmonary artery. There is calcific aortic atherosclerosis. Left mid lung pulmonary nodular opacities are unchanged. No focal airspace consolidation or pulmonary edema. Small right pleural effusion. IMPRESSION: 1. Cardiomegaly and enlarged pulmonary artery with aortic atherosclerosis (ICD10-I70.0). 2. Small right pleural effusion, unchanged. Electronically Signed   By: Ulyses Jarred M.D.   On: 05/23/2017 01:43   Ct Head Wo Contrast  Result Date: 05/23/2017 CLINICAL DATA:  Weakness.  History of colon cancer. EXAM: CT HEAD WITHOUT CONTRAST TECHNIQUE: Contiguous axial images were obtained from the base of the skull through the vertex without intravenous contrast. COMPARISON:  Brain MRI 01/12/2017 FINDINGS: Brain: No mass lesion, intraparenchymal hemorrhage or extra-axial collection. No evidence of acute cortical infarct. There is periventricular  hypoattenuation compatible with chronic microvascular disease. 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 Vascular: No hyperdense vessel or unexpected calcification. Skull: Normal visualized skull base, calvarium and extracranial soft tissues. Sinuses/Orbits: No sinus fluid levels or advanced mucosal thickening. No mastoid effusion. Normal orbits. 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 Electronically Signed   By: Ulyses Jarred M.D.   On: 05/23/2017 04:10   Mr Brain Wo Contrast  Result Date: 05/23/2017 CLINICAL DATA:  History of vascular dementia. Weight loss and failure to thrive, worsening over the last 2 weeks. EXAM: MRI HEAD WITHOUT CONTRAST TECHNIQUE: Multiplanar, multiecho pulse sequences of the brain and surrounding structures were obtained without intravenous contrast. COMPARISON:  CT 05/23/2017.  MRI 01/12/2017. FINDINGS: Brain: Diffusion imaging does not show any acute or subacute infarction. Chronic small-vessel  ischemic changes are seen throughout pons. Few old small vessel cerebellar infarctions. Cerebral hemispheres show extensive chronic small-vessel ischemic changes throughout the thalami, basal ganglia and hemispheric white matter. Many of the old infarctions are associated with hemosiderin deposition. More extensive old hemorrhagic infarction in the right basal ganglia an the left frontal subcortical white matter. No evidence of mass lesion. No sign of acute hemorrhage or edema. No hydrocephalus or extra-axial collection. Vascular: Major vessels at the base of the brain show flow. Skull and upper cervical spine: Negative Sinuses/Orbits: Clear/normal Other: None significant IMPRESSION: No acute or reversible finding by MRI. Extensive chronic ischemic changes throughout the brain, many associated with hemosiderin deposition, fully described above. Electronically Signed   By: Nelson Chimes M.D.   On: 05/23/2017 11:03     Scheduled Meds: . allopurinol  100 mg Oral Daily  . donepezil  10 mg Oral QHS  . feeding supplement (ENSURE ENLIVE)  237 mL Oral BID BM  . ferrous sulfate  325 mg Oral BID WC  . isosorbide-hydrALAZINE  2 tablet Oral TID  . labetalol  200 mg Oral BID  . levothyroxine  25 mcg Intravenous Daily  . sertraline  50 mg Oral Daily   Continuous Infusions: . sodium chloride 100 mL/hr at 05/24/17 0227  . cefTRIAXone (ROCEPHIN)  IV Stopped (05/24/17 0341)     LOS: 0 days   Time Spent in minutes   30 minutes  Suleyman Ehrman D.O. on 05/24/2017 at 1:38 PM  Between 7am to 7pm - Pager - [phone]  After 7pm go to [email or web address] - password [email or web address]  And look for the night coverage person covering for me after hours  [facility] Office  [phone]

## 2017-05-24 NOTE — Progress Notes (Signed)
  Speech Language Pathology Treatment: Dysphagia  Patient Details Name: Nicole [name] MRN: [id number] DOB: [birth year] Today's Date: 05/24/2017 Time: 1211-1227 SLP Time Calculation (min) (ACUTE ONLY): 16 min  Assessment / Plan / Recommendation Clinical Impression  F/u diet tolerance assessment complete. Improving alertness and response time noted today as compared to evaluation 11/1. Patient able to more swiftly transit soft solids orally with mildly prolonged mastication which based on notes is likely baseline. One episode of coughing post swallow with large cup sip of thin liquid followed by patient c/o globus although unable to provide specifics. Cued patient for multiple small cup sips of liquid to ensure esophageal clearance with reports of relief. Although orally, patient appropriate to advance to dysphagia 3 solids, mild difficulty noted today warrants continued diet recommendations. Prognosis for ability to advance remains good.    HPI HPI: Nicole [name] a 83 y.o.femalewith medical history significant of vascular dementia, CVA, A.Fib, accelerated HTN, CNS amyloid bleeds in June of this year. Patient brought in by family yesterday due to ? Chest pain, although some of the notes make it sound more like an adult failure to thrive issue with 14lb weight loss in past 2 weeks since son died. Dx with acute encephalopathy. Differential diagnosis include delirium due to UTI, stroke, worsening dementia, depression. MRI results pending. MBS in 2016 recommended dysphagia 3 with thin liquid; trace penetration of thin liquid via straw. Most recent bedside swallow eval complete 12/2016 recommended regular diet, thin liquid, no straws given s/s of aspiration noted at bedside.       SLP Plan  Continue with current plan of care       Recommendations  Diet recommendations: Dysphagia 2 (fine chop);Thin liquid Liquids provided via: Cup;No straw Medication Administration: Crushed with  puree Supervision: Patient able to self feed;Full supervision/cueing for compensatory strategies Compensations: Slow rate;Small sips/bites Postural Changes and/or Swallow Maneuvers: Seated upright 90 degrees                Oral Care Recommendations: Oral care BID Follow up Recommendations: None SLP Visit Diagnosis: Dysphagia, oropharyngeal phase (R13.12) Plan: Continue with current plan of care       Nicole [name], Nicole [name] [phone]   Nicole [name] 05/24/2017, 12:58 PM

## 2017-05-24 NOTE — Progress Notes (Signed)
Initial Nutrition Assessment  DOCUMENTATION CODES:  Non-severe (moderate) malnutrition in context of acute illness/injury  INTERVENTION:  Magic cup TID with meals, each supplement provides 290 kcal and 9 grams of protein  Ensure Enlive po BID, each supplement provides 350 kcal and 20 grams of protein  Question if patient would benefit from appetite stimulant  Would reweigh patient, suspect wt of 134 lbs was reported/not measured.   NUTRITION DIAGNOSIS:  Inadequate oral intake related to acute illness (UTI) chronic illness (Dementia) as evidenced by per family report of the patient only eating 2-3 spoonfuls at each meal   GOAL:  Patient will meet greater than or equal to 90% of their needs  MONITOR:  PO intake, Supplement acceptance, Labs, Weight trends  REASON FOR ASSESSMENT:  Malnutrition Screening Tool    ASSESSMENT:  83 y/o female PMHx vascular dementia, A. Fib, HTN, CNS amyloid bleeds, anxiety/peression, CKD, Colon Cancer, DM, CVA, GERD. Presented with chest pain, poor appetite, AMS, foul smelling urine and overall FTT. Admitted for workup of acute encephalopathy and possible UTI.   Patient is able to interact, but is confused and only responds appropriately approximately 50% of the time. As such, most information received from her husband at bedside. He states that for the past 1-2 weeks, the patient has had a poor po intake. She would eat the same number of meals, but the amount at each meal was miniscule, approximately 2-3 spoon fulls. He says she would drink glucerna, but only 1/2 a container/day. At baseline, the patient does not take any vitamins.   Both the husband and patient herself deny any n/v/c/d contributing to the patients poor intake.    Pt's functional status has declined to point she will not get out of bed. Apparently, she has lost 2 sons in the past few months. RD asked husband if he felt grief/depression had anything to do with decline. He felt this may have  had "a little" impact, but not much.   He says her normal weight is in the 140s. Per documented weight history, the patient was 148.8 lbs in August. Question if her weight of 134 lbs was reported or not. There is no documentation saying this was taken on a scale and the bed weight today is >150 lbs.   RD asked the patient what she would want to eat. Was largely unable to gather any preferences from her. She did request ice cream. Would continue Ensure. If her appetite is truly as poor as husband states, query if patient would benefit from appetite stimulant.   Physical Exam: Lower Body WDL,Mild muscle loss of clavicular/interosseous musculature. Mild fat wasting of triceps.   Labs: K:3.2, Bun/Creat:23/1.73, Elevated LA,  Meds: Aricept, Iron, Ensure, IVF, IV abx,    Recent Labs Lab 05/21/17 1818 05/23/17 0156 05/24/17 0347  NA 141 143 144  K 3.1* 3.7 3.2*  CL 104 106 111  CO2 25 26 25   BUN 12 20 23*  CREATININE 1.33* 1.78* 1.73*  CALCIUM 8.9 8.7* 8.0*  GLUCOSE 165* 151* 137*   NUTRITION - FOCUSED PHYSICAL EXAM:   Most Recent Value  Orbital Region  No depletion  Upper Arm Region  Mild depletion  Thoracic and Lumbar Region  Mild depletion  Buccal Region  No depletion  Temple Region  Mild depletion  Clavicle Bone Region  Mild depletion  Clavicle and Acromion Bone Region  Mild depletion  Scapular Bone Region  No depletion  Dorsal Hand  Mild depletion  Patellar Region  No depletion  Anterior Thigh Region  No depletion  Posterior Calf Region  No depletion       Diet Order:  DIET DYS 2 Room service appropriate? Yes; Fluid consistency: Thin  EDUCATION NEEDS:  No education needs have been identified at this time  Skin:  Skin Assessment: Reviewed RN Assessment  Last BM:  11/1  Height:  Ht Readings from Last 1 Encounters:  05/23/17 5\' 2"  (1.575 m)   Weight:  Wt Readings from Last 1 Encounters:  05/23/17 134 lb (60.8 kg)   Wt Readings from Last 10 Encounters:   05/23/17 134 lb (60.8 kg)  05/21/17 134 lb (60.8 kg)  03/04/17 148 lb 12.8 oz (67.5 kg)  01/31/17 153 lb 9.6 oz (69.7 kg)  01/24/17 153 lb 12.8 oz (69.8 kg)  01/18/17 141 lb 4.8 oz (64.1 kg)  01/11/17 146 lb 3.2 oz (66.3 kg)  11/08/16 152 lb (68.9 kg)  09/03/16 160 lb (72.6 kg)  08/26/16 161 lb 6 oz (73.2 kg)   Ideal Body Weight:  50 kg  BMI:  Body mass index is 24.51 kg/m.  Estimated Nutritional Needs:  Kcal:  1500-1700 (25-28 kcal/kg bw) Protein:  67-79g Pro (1.1-1.3 g/kg bw) Fluid:  >1.5 L fluid (25 ml/kg bw)   Burtis Junes RD, LDN, CNSC Clinical Nutrition Pager: [phone] 05/24/2017 2:07 PM

## 2017-05-25 DIAGNOSIS — Z888 Allergy status to other drugs, medicaments and biological substances status: Secondary | ICD-10-CM | POA: Diagnosis not present

## 2017-05-25 DIAGNOSIS — Z8673 Personal history of transient ischemic attack (TIA), and cerebral infarction without residual deficits: Secondary | ICD-10-CM | POA: Diagnosis not present

## 2017-05-25 DIAGNOSIS — N179 Acute kidney failure, unspecified: Secondary | ICD-10-CM | POA: Diagnosis not present

## 2017-05-25 DIAGNOSIS — E7800 Pure hypercholesterolemia, unspecified: Secondary | ICD-10-CM | POA: Diagnosis not present

## 2017-05-25 DIAGNOSIS — Z881 Allergy status to other antibiotic agents status: Secondary | ICD-10-CM | POA: Diagnosis not present

## 2017-05-25 DIAGNOSIS — N183 Chronic kidney disease, stage 3 (moderate): Secondary | ICD-10-CM | POA: Diagnosis not present

## 2017-05-25 DIAGNOSIS — Z66 Do not resuscitate: Secondary | ICD-10-CM | POA: Diagnosis not present

## 2017-05-25 DIAGNOSIS — I130 Hypertensive heart and chronic kidney disease with heart failure and stage 1 through stage 4 chronic kidney disease, or unspecified chronic kidney disease: Secondary | ICD-10-CM | POA: Diagnosis not present

## 2017-05-25 DIAGNOSIS — R627 Adult failure to thrive: Secondary | ICD-10-CM | POA: Diagnosis not present

## 2017-05-25 DIAGNOSIS — Z85038 Personal history of other malignant neoplasm of large intestine: Secondary | ICD-10-CM | POA: Diagnosis not present

## 2017-05-25 DIAGNOSIS — I4891 Unspecified atrial fibrillation: Secondary | ICD-10-CM | POA: Diagnosis not present

## 2017-05-25 DIAGNOSIS — I509 Heart failure, unspecified: Secondary | ICD-10-CM | POA: Diagnosis not present

## 2017-05-25 DIAGNOSIS — K219 Gastro-esophageal reflux disease without esophagitis: Secondary | ICD-10-CM | POA: Diagnosis not present

## 2017-05-25 DIAGNOSIS — E039 Hypothyroidism, unspecified: Secondary | ICD-10-CM | POA: Diagnosis not present

## 2017-05-25 DIAGNOSIS — N3000 Acute cystitis without hematuria: Secondary | ICD-10-CM | POA: Diagnosis not present

## 2017-05-25 DIAGNOSIS — G9341 Metabolic encephalopathy: Secondary | ICD-10-CM | POA: Diagnosis not present

## 2017-05-25 LAB — URINE CULTURE

## 2017-05-25 LAB — BASIC METABOLIC PANEL
ANION GAP: 7 (ref 5–15)
BUN: 18 mg/dL (ref 6–20)
CALCIUM: 8.1 mg/dL — AB (ref 8.9–10.3)
CO2: 21 mmol/L — ABNORMAL LOW (ref 22–32)
Chloride: 114 mmol/L — ABNORMAL HIGH (ref 101–111)
Creatinine, Ser: 1.31 mg/dL — ABNORMAL HIGH (ref 0.44–1.00)
GFR calc Af Amer: 42 mL/min — ABNORMAL LOW (ref >60)
GFR, EST NON AFRICAN AMERICAN: 36 mL/min — AB (ref >60)
GLUCOSE: 109 mg/dL — AB (ref 65–99)
Potassium: 3.4 mmol/L — ABNORMAL LOW (ref 3.5–5.1)
Sodium: 142 mmol/L (ref 135–145)

## 2017-05-25 LAB — CBC
HCT: 32.3 % — ABNORMAL LOW (ref 36.0–46.0)
Hemoglobin: 11 g/dL — ABNORMAL LOW (ref 12.0–15.0)
MCH: 25.6 pg — ABNORMAL LOW (ref 26.0–34.0)
MCHC: 34.1 g/dL (ref 30.0–36.0)
MCV: 75.1 fL — AB (ref 78.0–100.0)
PLATELETS: 171 10*3/uL (ref 150–400)
RBC: 4.3 MIL/uL (ref 3.87–5.11)
RDW: 16.6 % — AB (ref 11.5–15.5)
WBC: 6.6 10*3/uL (ref 4.0–10.5)

## 2017-05-25 MED ORDER — SODIUM CHLORIDE 0.9 % IV BOLUS (SEPSIS)
500.0000 mL | Freq: Once | INTRAVENOUS | Status: AC
  Administered 2017-05-25: 500 mL via INTRAVENOUS

## 2017-05-25 NOTE — Progress Notes (Signed)
500 ml fluid bolus completed per order. New BP 96/70, MAP 75, Pulse 102. Blount NP made aware. Will continue to monitor.

## 2017-05-25 NOTE — Progress Notes (Signed)
PROGRESS NOTE    Nicole [name]  CHY:[id number] DOB: 16-Dec-1930 DOA: 05/23/2017 PCP: [name], DO   Chief Complaint  Patient presents with  . Altered Mental Status  . Possible UTI    Brief Narrative:  HPI on 05/23/2017 by Dr. Vladimir Creeks Butleris a 83 y.o.femalewith medical history significant of vascular dementia, A.Fib, accelerated HTN, CNS amyloid bleeds in June of this year. Patient brought in by family yesterday due to ? Chest pain, although some of the notes make it sound more like an adult failure to thrive issue with 14lb weight loss in past 2 weeks since son died. She was started on labetalol yesterday in ED for SBP 200, discharged home. Today in addition to not eating, she essentially didn't even get out of bed. Family notes foul smelling urine and suspects UTI. Assessment & Plan   Acute metabolic encephalopathy -Possibly secondary to urinary tract infection versus worsening dementia versus possible CVA -appears to have resolved- not sure what patient's baseline -Friend at bedside and states patient is at her baseline.  -UA many bacteria, 6-30 WBCs, negative nitrites leukocytes -Urine culture pending -MRI brain: No acute reversible finding on MRI.  Acute kidney injury on chronic kidney disease, stage III -baseline creatinine 1.2, currently 1.73 -Creatinine currently 1.31 -continue IVF and monitor BMP  Possible urinary tract infection -UA as above -Urine culture >100K lactobacillus species (pending sensitivities) -continue ceftriaxone  Vascular dementia -Continue Aricept   Essential hypertension -continue labetalol, BiDil   Hypothyroidism  -Continue Synthroid  -TSH 0.240, FT4 1.22 -given acute illness, patient should follow up with her PCP for repeat testing  DVT Prophylaxis  SCDs  Code Status: DNR  Family Communication: Friend at bedside  Disposition Plan: Admitted, continue to monitor    Consultants None  Procedures  None  Antibiotics   Anti-infectives    Start     Dose/Rate Route Frequency Ordered Stop   05/23/17 0300  cefTRIAXone (ROCEPHIN) 1 g in dextrose 5 % 50 mL IVPB     1 g 100 mL/hr over 30 Minutes Intravenous Every 24 hours 05/23/17 0251        Subjective:   [name] seen and examined today.  Denies chest pain, shortness of breath, abdominal pain, N/V/D/C.   Objective:   Vitals:   05/25/17 0152 05/25/17 0603 05/25/17 0813 05/25/17 1304  BP: 112/72 (!) 136/96 (!) 161/98 138/87  Pulse: 79 (!) 117 (!) 107 (!) 101  Resp: 12 18  16   Temp:  97.9 F (36.6 C)  97.6 F (36.4 C)  TempSrc:  Oral  Oral  SpO2:  93%  98%  Weight:      Height:        Intake/Output Summary (Last 24 hours) at 05/25/17 1549 Last data filed at 05/25/17 8786  Gross per 24 hour  Intake              240 ml  Output              150 ml  Net               90 ml   Filed Weights   05/23/17 0045  Weight: 60.8 kg (134 lb)   Exam  General: Well developed, well nourished, NAD, appears stated age  HEENT: NCAT, mucous membranes moist.   Cardiovascular: S1 S2 auscultated, irregular   Respiratory: Clear to auscultation bilaterally with equal chest rise  Abdomen: Soft, nontender, nondistended, + bowel sounds  Extremities: warm dry  without cyanosis clubbing or edema  Neuro: AAOx1 (self), otherwise nonfocal  Psych: Appropriate  Data Reviewed: I have personally reviewed following labs and imaging studies  CBC:  Recent Labs Lab 05/21/17 1818 05/23/17 0156 05/24/17 0347 05/25/17 0711  WBC 6.7 7.5 7.1 6.6  NEUTROABS  --  6.2  --   --   HGB 13.2 13.0 10.6* 11.0*  HCT 37.9 37.5 31.0* 32.3*  MCV 74.0* 74.0* 73.8* 75.1*  PLT 247 231 180 621   Basic Metabolic Panel:  Recent Labs Lab 05/21/17 1818 05/23/17 0156 05/24/17 0347 05/25/17 0711  NA 141 143 144 142  K 3.1* 3.7 3.2* 3.4*  CL 104 106 111 114*  CO2 25 26 25  21*  GLUCOSE 165* 151* 137* 109*   BUN 12 20 23* 18  CREATININE 1.33* 1.78* 1.73* 1.31*  CALCIUM 8.9 8.7* 8.0* 8.1*   GFR: Estimated Creatinine Clearance: 27.5 mL/min (A) (by C-G formula based on SCr of 1.31 mg/dL (H)). Liver Function Tests: No results for input(s): AST, ALT, ALKPHOS, BILITOT, PROT, ALBUMIN in the last 168 hours. No results for input(s): LIPASE, AMYLASE in the last 168 hours. No results for input(s): AMMONIA in the last 168 hours. Coagulation Profile: No results for input(s): INR, PROTIME in the last 168 hours. Cardiac Enzymes: No results for input(s): CKTOTAL, CKMB, CKMBINDEX, TROPONINI in the last 168 hours. BNP (last 3 results) No results for input(s): PROBNP in the last 8760 hours. HbA1C: No results for input(s): HGBA1C in the last 72 hours. CBG: No results for input(s): GLUCAP in the last 168 hours. Lipid Profile: No results for input(s): CHOL, HDL, LDLCALC, TRIG, CHOLHDL, LDLDIRECT in the last 72 hours. Thyroid Function Tests:  Recent Labs  05/23/17 0517 05/24/17 1443  TSH 0.240*  --   FREET4  --  1.22*   Anemia Panel: No results for input(s): VITAMINB12, FOLATE, FERRITIN, TIBC, IRON, RETICCTPCT in the last 72 hours. Urine analysis:    Component Value Date/Time   COLORURINE AMBER (A) 05/23/2017 0156   APPEARANCEUR HAZY (A) 05/23/2017 0156   LABSPEC 1.026 05/23/2017 0156   PHURINE 5.0 05/23/2017 0156   GLUCOSEU NEGATIVE 05/23/2017 0156   HGBUR NEGATIVE 05/23/2017 0156   BILIRUBINUR SMALL (A) 05/23/2017 0156   KETONESUR NEGATIVE 05/23/2017 0156   PROTEINUR >=300 (A) 05/23/2017 0156   UROBILINOGEN 2.0 (H) 04/13/2015 1844   NITRITE NEGATIVE 05/23/2017 0156   LEUKOCYTESUR NEGATIVE 05/23/2017 0156   Sepsis Labs: @LABRCNTIP (procalcitonin:4,lacticidven:4)  ) Recent Results (from the past 240 hour(s))  Urine culture     Status: Abnormal   Collection Time: 05/23/17  1:56 AM  Result Value Ref Range Status   Specimen Description Urine  Final   Special Requests NONE  Final    Culture (A)  Final    >=100,000 COLONIES/mL LACTOBACILLUS SPECIES Standardized susceptibility testing for this organism is not available.    Report Status 05/25/2017 FINAL  Final      Radiology Studies: No results found.   Scheduled Meds: . allopurinol  100 mg Oral Daily  . donepezil  10 mg Oral QHS  . feeding supplement (ENSURE ENLIVE)  237 mL Oral BID BM  . ferrous sulfate  325 mg Oral BID WC  . isosorbide-hydrALAZINE  2 tablet Oral TID  . labetalol  200 mg Oral BID  . levothyroxine  50 mcg Oral QAC breakfast  . sertraline  50 mg Oral Daily   Continuous Infusions: . sodium chloride 100 mL/hr at 05/25/17 0719  . cefTRIAXone (ROCEPHIN)  IV Stopped (  05/25/17 0301)     LOS: 1 day   Time Spent in minutes   30 minutes  Myer Bohlman D.O. on 05/25/2017 at 3:49 PM  Between 7am to 7pm - Pager - [phone]  After 7pm go to [email or web address] - password [email or web address]  And look for the night coverage person covering for me after hours  [facility] Office  [phone]

## 2017-05-25 NOTE — Evaluation (Signed)
Occupational Therapy Evaluation Patient Details Name: Nicole [name] MRN: [id number] DOB: 1931-02-19 Today's Date: 05/25/2017    History of Present Illness 83 y.o.femalewith medical history significant of vascular dementia, A.Fib, accelerated HTN, CNS amyloid bleeds in June of this year. Patient brought in by family yesterday due to ? Chest pain, although some of the notes make it sound more like an adult failure to thrive issue with 14lb weight loss in past 2 weeks since son died. MRI on 2017/06/14 was negative.   Clinical Impression   PTA, pt was living with alone with 24 hour supervision from family, friends, and aide. Pt requiring Minimal A for ADLs and performs majority of BADLs. Pt currently requiring Min A for UB ADLs, Max A for LB ADL, and Mod-Max A for stand pivot transfer. Pt would benefit form acute OT to facilitate safe dc. Recommend dc to SNF for further OT to increase safety and independence with ADLs and functional mobility before returning home.    Follow Up Recommendations  SNF;Supervision/Assistance - 24 hour    Equipment Recommendations  Other (comment) (Defer to next venue)    Recommendations for Other Services PT consult     Precautions / Restrictions Precautions Precautions: Fall Restrictions Weight Bearing Restrictions: No      Mobility Bed Mobility Overal bed mobility: Needs Assistance Bed Mobility: Supine to Sit     Supine to sit: Mod assist;Max assist;HOB elevated     General bed mobility comments: Pt requiring Mod-Max A to bring BLEs to EOB and elevate trunk into sitting. Pt wanting to return to supine to sleep and required increased encouragment  Transfers Overall transfer level: Needs assistance Equipment used: 1 person hand held assist Transfers: Sit to/from [facility] Sit to Stand: Mod assist Stand pivot transfers: Max assist;Mod assist       General transfer comment: Pt demonstrating decreased balance in standing and  requiring UE support to maintain upright posture. Requiring VCs for sequencing of transfer    Balance Overall balance assessment: Needs assistance Sitting-balance support: No upper extremity supported;Feet supported Sitting balance-Leahy Scale: Fair     Standing balance support: Single extremity supported;During functional activity Standing balance-Leahy Scale: Poor Standing balance comment: Requiring UE support                           ADL either performed or assessed with clinical judgement   ADL Overall ADL's : Needs assistance/impaired Eating/Feeding: Set up;Sitting;Cueing for sequencing Eating/Feeding Details (indicate cue type and reason): Family reports pt has not been eating recently Grooming: Min guard;Sitting;Wash/dry face;Brushing hair;Minimal assistance Grooming Details (indicate cue type and reason): Performed grooming at EOB with Min Guard-Min A for safety. Pt wanting to return to supine to sleep and needed increased encouragement Upper Body Bathing: Minimal assistance;Cueing for sequencing;Sitting   Lower Body Bathing: Maximal assistance;Sit to/from stand;Cueing for sequencing   Upper Body Dressing : Minimal assistance;Sitting;Cueing for sequencing   Lower Body Dressing: Maximal assistance;Sit to/from stand;Cueing for sequencing   Toilet Transfer: Maximal assistance;Stand-pivot;Moderate assistance (Simualted to recliner) Toilet Transfer Details (indicate cue type and reason): Provided single hand held A and pt requiring Mod A to maintain balance. Without UE support, pt requiring Max A and had single LOB.          Functional mobility during ADLs: Moderate assistance;Maximal assistance;+2 for safety/equipment (Single hand held A) General ADL Comments: Pt dmeosntrating decreased functional performance and increased lethargy. Family reporting that pt is more drowsy than normal  and functional performance as declined in last few days. Pt able to perform  grooming tasks at EOB with increased encouragement.      Vision         Perception     Praxis      Pertinent Vitals/Pain Pain Assessment: Faces Faces Pain Scale: Hurts a little bit Pain Location: Generalized Pain Descriptors / Indicators: Constant;Discomfort Pain Intervention(s): Monitored during session     Hand Dominance Left   Extremity/Trunk Assessment Upper Extremity Assessment Upper Extremity Assessment: Generalized weakness   Lower Extremity Assessment Lower Extremity Assessment: Generalized weakness   Cervical / Trunk Assessment Cervical / Trunk Assessment: Kyphotic   Communication Communication Communication: Other (comment) (lethargy)   Cognition Arousal/Alertness: Lethargic Behavior During Therapy: Flat affect Overall Cognitive Status: History of cognitive impairments - at baseline                                 General Comments: baseline dementia. Pt family reporting decrease in cognition and arousal in last week   General Comments  Daughter present throughout session    Exercises     Shoulder Instructions      Home Living Family/patient expects to be discharged to:: Private residence Living Arrangements: Non-relatives/Friends Available Help at Discharge: Family;Personal care attendant;Available 24 hours/day Type of Home: House Home Access: Stairs to enter [employer] of Steps: 4 Entrance Stairs-Rails: Right;Left Home Layout: One level     Bathroom Shower/Tub: Tub/shower unit;Curtain   Bathroom Toilet: Handicapped height     Home Equipment: Environmental consultant - 2 wheels;Cane - single point;Shower seat   Additional Comments: Jess Barters, stays regularly.      Prior Functioning/Environment Level of Independence: Needs assistance  Gait / Transfers Assistance Needed: Recently been using cane for functional mobility ADL's / Homemaking Assistance Needed: Pt able to perform BADLs with assist as needed. Family/friends/aide  do IADLs   Comments: Aide comes in 10-2 every day        OT Problem List: Decreased strength;Decreased range of motion;Decreased activity tolerance;Impaired balance (sitting and/or standing);Decreased cognition;Decreased safety awareness;Decreased knowledge of use of DME or AE;Pain      OT Treatment/Interventions: Self-care/ADL training;Therapeutic exercise;Energy conservation;DME and/or AE instruction;Therapeutic activities;Patient/family education    OT Goals(Current goals can be found in the care plan section) Acute Rehab OT Goals Patient Stated Goal: Family goal "to get stronger" OT Goal Formulation: With patient/family Time For Goal Achievement: 06/08/17 Potential to Achieve Goals: Good ADL Goals Pt Will Perform Eating: with modified independence;sitting Pt Will Perform Grooming: with min guard assist;standing Pt Will Perform Upper Body Dressing: sitting;with set-up;with supervision Pt Will Perform Lower Body Dressing: with min assist;sit to/from stand Pt Will Transfer to Toilet: with min guard assist;bedside commode;ambulating  OT Frequency: Min 2X/week   Barriers to D/C:            Co-evaluation              AM-PAC PT "6 Clicks" Daily Activity     Outcome Measure Help from another person eating meals?: A Little Help from another person taking care of personal grooming?: A Little Help from another person toileting, which includes using toliet, bedpan, or urinal?: A Lot Help from another person bathing (including washing, rinsing, drying)?: A Lot Help from another person to put on and taking off regular upper body clothing?: A Little Help from another person to put on and taking off regular lower body clothing?: A Lot 6  Click Score: 15   End of Session Equipment Utilized During Treatment: Gait belt Nurse Communication: Mobility status;Other (comment) (Requiring external cath; IV fluid)  Activity Tolerance: Patient limited by lethargy;Patient limited by  fatigue Patient left: in chair;with chair alarm set;with call bell/phone within reach;with family/visitor present  OT Visit Diagnosis: Unsteadiness on feet (R26.81);Other abnormalities of gait and mobility (R26.89);Muscle weakness (generalized) (M62.81);Other symptoms and signs involving cognitive function;Pain Pain - part of body:  (Generalized)                Time: 4818-5631 OT Time Calculation (min): 28 min Charges:  OT General Charges $OT Visit: 1 Visit OT Evaluation $OT Eval Moderate Complexity: 1 Mod OT Treatments $Self Care/Home Management : 8-22 mins G-Codes:     [place], OTR/L Acute Rehab Pager: [phone] Office: [place] 05/25/2017, 11:55 AM

## 2017-05-25 NOTE — NC FL2 (Signed)
[place] LEVEL OF CARE SCREENING TOOL     IDENTIFICATION  Patient Name: Nicole [name] Birthdate: [birth year] Sex: female Admission Date (Current Location): 05/23/2017  [facility] and [place] Number:  Herbalist and Address:  [facility], [place] [address], [place], [place] [address]      Provider Number: [id number]  Attending Physician Name and Address:  Cristal Ford, DO  Relative Name and Phone Number:       Current Level of Care: Hospital Recommended Level of Care: [place] Prior Approval Number:    Date Approved/Denied:   PASRR Number:   [id number]  Discharge Plan: SNF    Current Diagnoses: Patient Active Problem List   Diagnosis Date Noted  . Acute encephalopathy 05/23/2017  . Acute lower UTI 05/23/2017  . Palliative care by specialist   . DNR (do not resuscitate)   . Bradyarrhythmia 01/11/2017  . Generalized weakness 01/11/2017  . General weakness 01/11/2017  . PE (pulmonary thromboembolism) ([place]) 08/24/2016  . Pulmonary embolus ([place])   . Accelerated hypertension with diastolic congestive heart failure, NYHA class 3 ([place]) 01/05/2016  . Permanent atrial fibrillation ([place]) 01/05/2016  . Rib pain on right side 10/14/2015  . Hypothyroidism due to acquired atrophy of thyroid 10/14/2015  . Type 2 diabetes mellitus with diabetic nephropathy, without long-term current use of insulin ([place]) 10/14/2015  . Essential hypertension 10/14/2015  . Acute on chronic systolic heart failure ([place]) 08/02/2015  . Acute on chronic diastolic heart failure ([place]) 08/02/2015  . Bradycardia 07/27/2015  . Chronic anemia 07/27/2015  . Chest pain   . Vascular dementia 06/13/2015  . Memory loss 06/13/2015  . Left hemiparesis ([place])   . Chronic diastolic CHF (congestive heart failure), NYHA class 2 ([place])   . Persistent atrial fibrillation ([place])   . Type 2 diabetes mellitus with peripheral neuropathy (HCC)   . ICH  (intracerebral hemorrhage) ([place]) 04/04/2015  . Chronic atrial fibrillation ([place])   . Hypertensive urgency   . SOB (shortness of breath) 04/03/2014  . Diabetic nephropathy ([place]) 09/07/2013  . Chronic constipation 03/25/2013  . Carpal tunnel syndrome 02/25/2013  . Hypokalemia 05/17/2012  . Normal coronary arteries, 2008 05/15/2012  . Type II or unspecified type diabetes mellitus with neurological manifestations, not stated as uncontrolled(250.60) 05/15/2012  . HTN (hypertension) 05/15/2012  . Acute respiratory distress 05/15/2012  . Abdominal pain, acute, right upper quadrant 05/12/2012  . Cholecystitis chronic, acute, S/P lap cholecystectomy 05/14/12 05/12/2012  . Thoracic aortic aneurysm without rupture ([place])   . CHF, past NICM with an EF of 30%, most recent echo 2011 showed EF to be45- 50%   . Anemia 08/09/2011  . History of colon cancer, stage III 08/09/2011    Orientation RESPIRATION BLADDER Height & Weight     Self, Place  Normal External catheter Weight: 134 lb (60.8 kg) Height:  5\' 2"  (157.5 cm)  BEHAVIORAL SYMPTOMS/MOOD NEUROLOGICAL BOWEL NUTRITION STATUS      Continent Diet (Dysphagia 2 with Thin Liquids)  AMBULATORY STATUS COMMUNICATION OF NEEDS Skin   Extensive Assist Verbally Normal                       Personal Care Assistance Level of Assistance  Bathing, Feeding, Dressing Bathing Assistance: Limited assistance Feeding assistance: Limited assistance Dressing Assistance: Limited assistance     Functional Limitations Info  Sight, Hearing, Speech Sight Info: Adequate Hearing Info: Adequate Speech Info: Adequate    SPECIAL CARE FACTORS  FREQUENCY  PT (By licensed PT), OT (By licensed OT), Speech therapy     PT Frequency: 3x week OT Frequency: 2x week     Speech Therapy Frequency: 2x week      Contractures Contractures Info: Not present    Additional Factors Info  Code Status, Allergies, Psychotropic Code Status Info: DNR Allergies Info:  Iohexol, Iodinated Diagnostic Agents, Z-pak Azithromycin Psychotropic Info: Aricept / Zoloft         Current Medications (05/25/2017):  This is the current hospital active medication list Current Facility-Administered Medications  Medication Dose Route Frequency Provider Last Rate Last Dose  . 0.9 %  sodium chloride infusion   Intravenous Continuous Etta Quill, DO 100 mL/hr at 05/25/17 0719    . acetaminophen (TYLENOL) tablet 650 mg  650 mg Oral Q6H PRN Etta Quill, DO   650 mg at 05/25/17 1306   Or  . acetaminophen (TYLENOL) suppository 650 mg  650 mg Rectal Q6H PRN Etta Quill, DO      . allopurinol (ZYLOPRIM) tablet 100 mg  100 mg Oral Daily Jennette Kettle M, DO   100 mg at 05/25/17 7989  . cefTRIAXone (ROCEPHIN) 1 g in dextrose 5 % 50 mL IVPB  1 g Intravenous Q24H Larene Pickett, PA-C   Stopped at 05/25/17 0301  . donepezil (ARICEPT) tablet 10 mg  10 mg Oral QHS Jennette Kettle M, DO   10 mg at 05/24/17 2256  . feeding supplement (ENSURE ENLIVE) (ENSURE ENLIVE) liquid 237 mL  237 mL Oral BID BM Mikhail, Maryann, DO   237 mL at 05/25/17 1500  . ferrous sulfate tablet 325 mg  325 mg Oral BID WC Etta Quill, DO   325 mg at 05/25/17 2119  . isosorbide-hydrALAZINE (BIDIL) 20-37.5 MG per tablet 2 tablet  2 tablet Oral TID Etta Quill, DO   2 tablet at 05/25/17 4174  . labetalol (NORMODYNE) tablet 200 mg  200 mg Oral BID Etta Quill, DO   200 mg at 05/25/17 0815  . labetalol (NORMODYNE,TRANDATE) injection 5-10 mg  5-10 mg Intravenous Q2H PRN Etta Quill, DO      . levothyroxine (SYNTHROID, LEVOTHROID) tablet 50 mcg  50 mcg Oral QAC breakfast Alvira Philips, [place]   50 mcg at 05/25/17 0814  . ondansetron (ZOFRAN) tablet 4 mg  4 mg Oral Q6H PRN Etta Quill, DO   4 mg at 05/23/17 1626   Or  . ondansetron (ZOFRAN) injection 4 mg  4 mg Intravenous Q6H PRN Etta Quill, DO      . sertraline (ZOLOFT) tablet 50 mg  50 mg Oral Daily Jennette Kettle M, DO   50  mg at 05/25/17 4818     Discharge Medications: Please see discharge summary for a list of discharge medications.  Relevant Imaging Results:  Relevant Lab Results:   Additional Information SSN [id number]  Barbette Or, [place]

## 2017-05-25 NOTE — Clinical Social Work Note (Signed)
Clinical Social Work Assessment  Patient Details  Name: Nicole [name] MRN: [id number] Date of Birth: 1931/04/30  Date of referral:  05/25/17               Reason for consult:  Facility Placement                Permission sought to share information with:  Family Supports Permission granted to share information::  Yes, Verbal Permission Granted  Name::     [name]  Relationship::  Daughter  Contact Information:  [phone]  Housing/Transportation Living arrangements for the past 2 months:  [place] of Information:  Patient, Adult Children Patient Interpreter Needed:  None Criminal Activity/Legal Involvement Pertinent to Current Situation/Hospitalization:  No - Comment as needed Significant Relationships:  Adult Children, Significant Other Lives with:  Significant Other Do you feel safe going back to the place where you live?  No Need for family participation in patient care:  Yes (Comment)  Care giving concerns:  Patient daughter at bedside and expressed concerns that patient is becoming more lethargic throughout hospitalization.  Patient daughter is fearful that patient is just now grieving the loss of her sons.   Social Worker assessment / plan:  Holiday representative met with patient and patient daughter at bedside.  Patient difficult to arouse but did engage briefly in conversation.  Patient daughter states that patient suffered a stroke two years ago and has had periods of decline ever since.  Patient was being cared for by her significant other and two sons for the last two years, until one son passed four months ago and the other last month from natural causes.  Patient daughter is agreeable with ST-SNF placement, however would like for MD to further explore patient lethargy prior to discharge.  CSW to complete FL2 and initiate SNF search in [place].  CSW to follow up with patient daughter once bed offers are available.  Employment status:   Retired Nurse, adult PT Recommendations:  [place] / Referral to community resources:  [place]  Patient/Family's Response to care:  Patient daughter verbalized understanding of CSW role and appreciation for support and concern.  Patient daughter is feeling overwhelmed with the process of grieving her brothers and patient decline in overall health.  Patient daughter is agreeable to SNF placement in the area.  Patient/Family's Understanding of and Emotional Response to Diagnosis, Current Treatment, and Prognosis:  Patient family understanding of patient need for placement, however still concerned regarding patient diagnosis and prognosis moving forward.  Patient daughter anxious to speak with MD regarding patient plans moving forward.  Emotional Assessment Appearance:  Appears younger than stated age Attitude/Demeanor/Rapport:  Lethargic, Inconsistent Affect (typically observed):  Unable to Assess Orientation:  Oriented to Self, Oriented to Place Alcohol / Substance use:  Not Applicable Psych involvement (Current and /or in the community):  No (Comment)  Discharge Needs  Concerns to be addressed:  Discharge Planning Concerns Readmission within the last 30 days:  No Current discharge risk:  Dependent with Mobility, Cognitively Impaired Barriers to Discharge:  Continued Medical Work up  [employer], [place]

## 2017-05-25 NOTE — Progress Notes (Signed)
Pt BP 89/54, MAP 63, pulse 67 via Dinamap after receiving nightly PO BP meds. Rechecked with manual cuff and BP was 85/53, pulse 65. Pt is asymptomatic, rousable, and resting. Paged Kennon Holter, NP. Will continue to monitor.

## 2017-05-26 DIAGNOSIS — N183 Chronic kidney disease, stage 3 (moderate): Secondary | ICD-10-CM | POA: Diagnosis not present

## 2017-05-26 DIAGNOSIS — Z881 Allergy status to other antibiotic agents status: Secondary | ICD-10-CM | POA: Diagnosis not present

## 2017-05-26 DIAGNOSIS — Z85038 Personal history of other malignant neoplasm of large intestine: Secondary | ICD-10-CM | POA: Diagnosis not present

## 2017-05-26 DIAGNOSIS — N179 Acute kidney failure, unspecified: Secondary | ICD-10-CM | POA: Diagnosis not present

## 2017-05-26 DIAGNOSIS — Z888 Allergy status to other drugs, medicaments and biological substances status: Secondary | ICD-10-CM | POA: Diagnosis not present

## 2017-05-26 DIAGNOSIS — Z8673 Personal history of transient ischemic attack (TIA), and cerebral infarction without residual deficits: Secondary | ICD-10-CM | POA: Diagnosis not present

## 2017-05-26 DIAGNOSIS — R627 Adult failure to thrive: Secondary | ICD-10-CM | POA: Diagnosis not present

## 2017-05-26 DIAGNOSIS — Z66 Do not resuscitate: Secondary | ICD-10-CM | POA: Diagnosis not present

## 2017-05-26 DIAGNOSIS — I4891 Unspecified atrial fibrillation: Secondary | ICD-10-CM | POA: Diagnosis not present

## 2017-05-26 DIAGNOSIS — N3000 Acute cystitis without hematuria: Secondary | ICD-10-CM | POA: Diagnosis not present

## 2017-05-26 DIAGNOSIS — K219 Gastro-esophageal reflux disease without esophagitis: Secondary | ICD-10-CM | POA: Diagnosis not present

## 2017-05-26 DIAGNOSIS — I130 Hypertensive heart and chronic kidney disease with heart failure and stage 1 through stage 4 chronic kidney disease, or unspecified chronic kidney disease: Secondary | ICD-10-CM | POA: Diagnosis not present

## 2017-05-26 DIAGNOSIS — E7800 Pure hypercholesterolemia, unspecified: Secondary | ICD-10-CM | POA: Diagnosis not present

## 2017-05-26 DIAGNOSIS — I509 Heart failure, unspecified: Secondary | ICD-10-CM | POA: Diagnosis not present

## 2017-05-26 DIAGNOSIS — E039 Hypothyroidism, unspecified: Secondary | ICD-10-CM | POA: Diagnosis not present

## 2017-05-26 DIAGNOSIS — G9341 Metabolic encephalopathy: Secondary | ICD-10-CM | POA: Diagnosis not present

## 2017-05-26 LAB — LACTIC ACID, PLASMA: Lactic Acid, Venous: 1 mmol/L (ref 0.5–1.9)

## 2017-05-26 LAB — BASIC METABOLIC PANEL
ANION GAP: 7 (ref 5–15)
BUN: 14 mg/dL (ref 6–20)
CHLORIDE: 113 mmol/L — AB (ref 101–111)
CO2: 22 mmol/L (ref 22–32)
Calcium: 8.5 mg/dL — ABNORMAL LOW (ref 8.9–10.3)
Creatinine, Ser: 1.13 mg/dL — ABNORMAL HIGH (ref 0.44–1.00)
GFR calc non Af Amer: 43 mL/min — ABNORMAL LOW (ref >60)
GFR, EST AFRICAN AMERICAN: 50 mL/min — AB (ref >60)
Glucose, Bld: 118 mg/dL — ABNORMAL HIGH (ref 65–99)
Potassium: 3.7 mmol/L (ref 3.5–5.1)
Sodium: 142 mmol/L (ref 135–145)

## 2017-05-26 MED ORDER — HYDRALAZINE HCL 20 MG/ML IJ SOLN
10.0000 mg | Freq: Four times a day (QID) | INTRAMUSCULAR | Status: DC | PRN
  Administered 2017-05-26: 10 mg via INTRAVENOUS
  Administered 2017-05-27: 06:00:00 via INTRAVENOUS
  Filled 2017-05-26 (×2): qty 1

## 2017-05-26 MED ORDER — WHITE PETROLATUM EX OINT
TOPICAL_OINTMENT | CUTANEOUS | Status: AC
  Administered 2017-05-26: 14:00:00
  Filled 2017-05-26: qty 28.35

## 2017-05-26 NOTE — Progress Notes (Signed)
PROGRESS NOTE    Nicole [name]  JOA:[id number] DOB: Nov 17, 1930 DOA: 05/23/2017 PCP: [name], DO   Chief Complaint  Patient presents with  . Altered Mental Status  . Possible UTI    Brief Narrative:  HPI on 05/23/2017 by Dr. Vladimir Creeks Butleris a 83 y.o.femalewith medical history significant of vascular dementia, A.Fib, accelerated HTN, CNS amyloid bleeds in June of this year. Patient brought in by family yesterday due to ? Chest pain, although some of the notes make it sound more like an adult failure to thrive issue with 14lb weight loss in past 2 weeks since son died. She was started on labetalol yesterday in ED for SBP 200, discharged home. Today in addition to not eating, she essentially didn't even get out of bed. Family notes foul smelling urine and suspects UTI. Assessment & Plan   Acute metabolic encephalopathy -Possibly secondary to urinary tract infection versus worsening dementia versus possible CVA -appears to have resolved- not sure what patient's baseline -Friend at bedside and states patient is at her baseline.  -UA many bacteria, 6-30 WBCs, negative nitrites leukocytes -Urine culture pending -MRI brain: No acute reversible finding on MRI.  Acute kidney injury on chronic kidney disease, stage III -baseline creatinine 1.2, currently 1.73 -Creatinine currently 1.13 -continue IVF and monitor BMP  Possible urinary tract infection -UA as above -Urine culture >100K lactobacillus species (discussed with pharmacy, likely contaminant) -Was placed on ceftriaxone  Vascular dementia -Continue Aricept   Essential hypertension -continue labetalol, BiDil   Hypothyroidism  -Continue Synthroid  -TSH 0.240, FT4 1.22 -given acute illness, patient should follow up with her PCP for repeat testing  DVT Prophylaxis  SCDs  Code Status: DNR  Family Communication: none at bedside  Disposition Plan: Admitted, continue to monitor    Consultants None  Procedures  None  Antibiotics   Anti-infectives (From admission, onward)   Start     Dose/Rate Route Frequency Ordered Stop   05/23/17 0300  cefTRIAXone (ROCEPHIN) 1 g in dextrose 5 % 50 mL IVPB     1 g 100 mL/hr over 30 Minutes Intravenous Every 24 hours 05/23/17 0251        Subjective:   [name] seen and examined today.  Denies chest pain, shortness of breath, abdominal pain, N/V/D/C.   Objective:   Vitals:   05/26/17 0446 05/26/17 0701 05/26/17 0906 05/26/17 1312  BP: (!) 164/102 (!) 150/95 (!) 180/113 (!) 144/86  Pulse: 79  87 86  Resp: (!) 21   15  Temp: 97.9 F (36.6 C)   97.6 F (36.4 C)  TempSrc: Oral   Oral  SpO2: 94%   96%  Weight:      Height:        Intake/Output Summary (Last 24 hours) at 05/26/2017 1426 Last data filed at 05/26/2017 1145 Gross per 24 hour  Intake 1740 ml  Output 500 ml  Net 1240 ml   Filed Weights   05/23/17 0045  Weight: 60.8 kg (134 lb)   Exam  General: Well developed, elderly, NAD  HEENT: NCAT, mucous membranes moist.   Cardiovascular: S1 S2 auscultated, irregular  Respiratory: Clear to auscultation bilaterally with equal chest rise  Abdomen: Soft, nontender, nondistended, + bowel sounds  Extremities: warm dry without cyanosis clubbing or edema  Neuro: AAOx1, nonfocal   Psych: Appropriate   Data Reviewed: I have personally reviewed following labs and imaging studies  CBC: Recent Labs  Lab 05/21/17 1818 05/23/17 0156 05/24/17 0347 05/25/17  0711  WBC 6.7 7.5 7.1 6.6  NEUTROABS  --  6.2  --   --   HGB 13.2 13.0 10.6* 11.0*  HCT 37.9 37.5 31.0* 32.3*  MCV 74.0* 74.0* 73.8* 75.1*  PLT 247 231 180 147   Basic Metabolic Panel: Recent Labs  Lab 05/21/17 1818 05/23/17 0156 05/24/17 0347 05/25/17 0711 05/26/17 0556  NA 141 143 144 142 142  K 3.1* 3.7 3.2* 3.4* 3.7  CL 104 106 111 114* 113*  CO2 25 26 25  21* 22  GLUCOSE 165* 151* 137* 109* 118*  BUN 12 20 23* 18 14   CREATININE 1.33* 1.78* 1.73* 1.31* 1.13*  CALCIUM 8.9 8.7* 8.0* 8.1* 8.5*   GFR: Estimated Creatinine Clearance: 31.8 mL/min (A) (by C-G formula based on SCr of 1.13 mg/dL (H)). Liver Function Tests: No results for input(s): AST, ALT, ALKPHOS, BILITOT, PROT, ALBUMIN in the last 168 hours. No results for input(s): LIPASE, AMYLASE in the last 168 hours. No results for input(s): AMMONIA in the last 168 hours. Coagulation Profile: No results for input(s): INR, PROTIME in the last 168 hours. Cardiac Enzymes: No results for input(s): CKTOTAL, CKMB, CKMBINDEX, TROPONINI in the last 168 hours. BNP (last 3 results) No results for input(s): PROBNP in the last 8760 hours. HbA1C: No results for input(s): HGBA1C in the last 72 hours. CBG: No results for input(s): GLUCAP in the last 168 hours. Lipid Profile: No results for input(s): CHOL, HDL, LDLCALC, TRIG, CHOLHDL, LDLDIRECT in the last 72 hours. Thyroid Function Tests: Recent Labs    05/24/17 1443  FREET4 1.22*   Anemia Panel: No results for input(s): VITAMINB12, FOLATE, FERRITIN, TIBC, IRON, RETICCTPCT in the last 72 hours. Urine analysis:    Component Value Date/Time   COLORURINE AMBER (A) 05/23/2017 0156   APPEARANCEUR HAZY (A) 05/23/2017 0156   LABSPEC 1.026 05/23/2017 0156   PHURINE 5.0 05/23/2017 0156   GLUCOSEU NEGATIVE 05/23/2017 0156   HGBUR NEGATIVE 05/23/2017 0156   BILIRUBINUR SMALL (A) 05/23/2017 0156   KETONESUR NEGATIVE 05/23/2017 0156   PROTEINUR >=300 (A) 05/23/2017 0156   UROBILINOGEN 2.0 (H) 04/13/2015 1844   NITRITE NEGATIVE 05/23/2017 0156   LEUKOCYTESUR NEGATIVE 05/23/2017 0156   Sepsis Labs: @LABRCNTIP (procalcitonin:4,lacticidven:4)  ) Recent Results (from the past 240 hour(s))  Urine culture     Status: Abnormal   Collection Time: 05/23/17  1:56 AM  Result Value Ref Range Status   Specimen Description Urine  Final   Special Requests NONE  Final   Culture (A)  Final    >=100,000 COLONIES/mL  LACTOBACILLUS SPECIES Standardized susceptibility testing for this organism is not available.    Report Status 05/25/2017 FINAL  Final      Radiology Studies: No results found.   Scheduled Meds: . allopurinol  100 mg Oral Daily  . donepezil  10 mg Oral QHS  . feeding supplement (ENSURE ENLIVE)  237 mL Oral BID BM  . ferrous sulfate  325 mg Oral BID WC  . isosorbide-hydrALAZINE  2 tablet Oral TID  . labetalol  200 mg Oral BID  . levothyroxine  50 mcg Oral QAC breakfast  . sertraline  50 mg Oral Daily   Continuous Infusions: . sodium chloride 100 mL/hr at 05/26/17 1231  . cefTRIAXone (ROCEPHIN)  IV Stopped (05/26/17 0540)     LOS: 2 days   Time Spent in minutes   30 minutes  Romello Hoehn D.O. on 05/26/2017 at 2:26 PM  Between 7am to 7pm - Pager - [phone]  After  7pm go to [email or web address] - password [email or web address]  And look for the night coverage person covering for me after hours  [facility] Office  [phone]

## 2017-05-26 NOTE — Evaluation (Signed)
Physical Therapy Evaluation Patient Details Name: Nicole [name] MRN: [id number] DOB: 1931-05-12 Today's Date: 05/26/2017   History of Present Illness  83 y.o.femalewith medical history significant of vascular dementia, A.Fib, accelerated HTN, CNS amyloid bleeds in June of this year. Patient brought in by family yesterday due to ? Chest pain, although some of the notes make it sound more like an adult failure to thrive issue with 14lb weight loss in past 2 weeks since son died. MRI on 06-13-17 was negative.  Clinical Impression  Pt needed encouragement to get up.  Pt did well walking short distance - with chair following her for safety.  Pt fatiqued after walking - she needs to do more walking etc.  Recommending SNF for increasing her activity level with daily PT.  Pt will benefit from skilled PT services to get her safer with her basic mobilty.   Follow Up Recommendations SNF    Equipment Recommendations  None recommended by PT    Recommendations for Other Services       Precautions / Restrictions Precautions Precautions: Fall Precaution Comments: pt denies any recent falls Restrictions Weight Bearing Restrictions: No      Mobility  Bed Mobility Overal bed mobility: Needs Assistance Bed Mobility: Supine to Sit     Supine to sit: Mod assist;HOB elevated     General bed mobility comments: pt required encouragement to get OOB  Transfers Overall transfer level: Needs assistance Equipment used: Rolling walker (2 wheeled) Transfers: Sit to/from [facility] Sit to Stand: Min assist;From elevated surface         General transfer comment: pt required sequencing for technque. she did better with standing the more she was up. P t reported her legs felt weak - 'will hold me up for only a minute'  Ambulation/Gait Ambulation/Gait assistance: Min assist Ambulation Distance (Feet): 25 Feet Assistive device: Rolling walker (2 wheeled) Gait Pattern/deviations:  Decreased step length - right;Decreased step length - left;Trunk flexed;Shuffle     General Gait Details: I kept chair behind her for safety. she did slow steady walking.  pt needed cues for technique.  Pt resting pulse 96 and pulse ox 91% and after walking 25 feet she was 116bpm and pulse ox 89%.  pt encouraged to take deep breaths  Stairs            Wheelchair Mobility    Modified Rankin (Stroke Patients Only)       Balance Overall balance assessment: Needs assistance Sitting-balance support: No upper extremity supported;Feet supported Sitting balance-Leahy Scale: Fair         Standing balance comment: Requiring UE support                             Pertinent Vitals/Pain Pain Assessment: No/denies pain(pt denies pain when asked. no signs of hurting)    Home Living     Available Help at Discharge: Family;Personal care attendant;Available 24 hours/day           Home Equipment: Walker - 2 wheels;Cane - single point;Shower seat      Prior Function     Gait / Transfers Assistance Needed: Pt told PT that she was using RW for walking           Hand Dominance        Extremity/Trunk Assessment   Upper Extremity Assessment Upper Extremity Assessment: Generalized weakness    Lower Extremity Assessment Lower Extremity Assessment: Generalized weakness  Cervical / Trunk Assessment Cervical / Trunk Assessment: Kyphotic  Communication      Cognition Arousal/Alertness: Lethargic Behavior During Therapy: Flat affect Overall Cognitive Status: History of cognitive impairments - at baseline                                 General Comments: baseline dementia. Pt family reporting decrease in cognition and arousal in last week      General Comments      Exercises     Assessment/Plan    PT Assessment Patient needs continued PT services  PT Problem List Decreased strength;Decreased activity tolerance;Decreased  mobility;Decreased safety awareness;Cardiopulmonary status limiting activity       PT Treatment Interventions DME instruction;Gait training;Functional mobility training;Therapeutic activities;Balance training;Patient/family education    PT Goals (Current goals can be found in the Care Plan section)  Acute Rehab PT Goals Patient Stated Goal: Family goal "to get stronger" PT Goal Formulation: With patient Time For Goal Achievement: 06/08/17 Potential to Achieve Goals: Good    Frequency Min 3X/week   Barriers to discharge   Pt not doing well in her home environment - recommending SNF to help her increase her activity level with hopes of returning home safely    Co-evaluation               AM-PAC PT "6 Clicks" Daily Activity  Outcome Measure Difficulty turning over in bed (including adjusting bedclothes, sheets and blankets)?: A Lot Difficulty moving from lying on back to sitting on the side of the bed? : A Lot Difficulty sitting down on and standing up from a chair with arms (e.g., wheelchair, bedside commode, etc,.)?: A Lot Help needed moving to and from a bed to chair (including a wheelchair)?: A Lot Help needed walking in hospital room?: A Lot Help needed climbing 3-5 steps with a railing? : Total 6 Click Score: 11    End of Session Equipment Utilized During Treatment: Gait belt Activity Tolerance: Patient limited by fatigue Patient left: in chair;with chair alarm set;with call bell/phone within reach Nurse Communication: Mobility status PT Visit Diagnosis: Unsteadiness on feet (R26.81);Muscle weakness (generalized) (M62.81);Adult, failure to thrive (R62.7)    Time: 0800-0830 PT Time Calculation (min) (ACUTE ONLY): 30 min   Charges:   PT Evaluation $PT Eval Low Complexity: 1 Low PT Treatments $Gait Training: 8-22 mins   PT G Codes:        2017/05/30   Rande Lawman, PT   Loyal Buba 2017-05-30, 9:35 AM

## 2017-05-27 ENCOUNTER — Other Ambulatory Visit: Payer: Self-pay

## 2017-05-27 ENCOUNTER — Ambulatory Visit: Payer: Medicare Other | Admitting: Internal Medicine

## 2017-05-27 DIAGNOSIS — N3000 Acute cystitis without hematuria: Secondary | ICD-10-CM | POA: Diagnosis not present

## 2017-05-27 DIAGNOSIS — N179 Acute kidney failure, unspecified: Secondary | ICD-10-CM | POA: Diagnosis not present

## 2017-05-27 DIAGNOSIS — I5023 Acute on chronic systolic (congestive) heart failure: Secondary | ICD-10-CM | POA: Diagnosis not present

## 2017-05-27 DIAGNOSIS — I129 Hypertensive chronic kidney disease with stage 1 through stage 4 chronic kidney disease, or unspecified chronic kidney disease: Secondary | ICD-10-CM | POA: Diagnosis not present

## 2017-05-27 DIAGNOSIS — I509 Heart failure, unspecified: Secondary | ICD-10-CM | POA: Diagnosis not present

## 2017-05-27 DIAGNOSIS — I130 Hypertensive heart and chronic kidney disease with heart failure and stage 1 through stage 4 chronic kidney disease, or unspecified chronic kidney disease: Secondary | ICD-10-CM | POA: Diagnosis not present

## 2017-05-27 DIAGNOSIS — Z881 Allergy status to other antibiotic agents status: Secondary | ICD-10-CM | POA: Diagnosis not present

## 2017-05-27 DIAGNOSIS — E7800 Pure hypercholesterolemia, unspecified: Secondary | ICD-10-CM | POA: Diagnosis not present

## 2017-05-27 DIAGNOSIS — J189 Pneumonia, unspecified organism: Secondary | ICD-10-CM | POA: Diagnosis not present

## 2017-05-27 DIAGNOSIS — R2689 Other abnormalities of gait and mobility: Secondary | ICD-10-CM | POA: Diagnosis not present

## 2017-05-27 DIAGNOSIS — Z66 Do not resuscitate: Secondary | ICD-10-CM | POA: Diagnosis not present

## 2017-05-27 DIAGNOSIS — M6281 Muscle weakness (generalized): Secondary | ICD-10-CM | POA: Diagnosis not present

## 2017-05-27 DIAGNOSIS — Z85038 Personal history of other malignant neoplasm of large intestine: Secondary | ICD-10-CM | POA: Diagnosis not present

## 2017-05-27 DIAGNOSIS — I5032 Chronic diastolic (congestive) heart failure: Secondary | ICD-10-CM | POA: Diagnosis not present

## 2017-05-27 DIAGNOSIS — N390 Urinary tract infection, site not specified: Secondary | ICD-10-CM | POA: Diagnosis not present

## 2017-05-27 DIAGNOSIS — J90 Pleural effusion, not elsewhere classified: Secondary | ICD-10-CM | POA: Diagnosis not present

## 2017-05-27 DIAGNOSIS — R531 Weakness: Secondary | ICD-10-CM | POA: Diagnosis not present

## 2017-05-27 DIAGNOSIS — Z87440 Personal history of urinary (tract) infections: Secondary | ICD-10-CM | POA: Diagnosis not present

## 2017-05-27 DIAGNOSIS — F332 Major depressive disorder, recurrent severe without psychotic features: Secondary | ICD-10-CM | POA: Diagnosis not present

## 2017-05-27 DIAGNOSIS — I10 Essential (primary) hypertension: Secondary | ICD-10-CM | POA: Diagnosis not present

## 2017-05-27 DIAGNOSIS — G9341 Metabolic encephalopathy: Secondary | ICD-10-CM | POA: Diagnosis not present

## 2017-05-27 DIAGNOSIS — Z8673 Personal history of transient ischemic attack (TIA), and cerebral infarction without residual deficits: Secondary | ICD-10-CM | POA: Diagnosis not present

## 2017-05-27 DIAGNOSIS — R011 Cardiac murmur, unspecified: Secondary | ICD-10-CM | POA: Diagnosis not present

## 2017-05-27 DIAGNOSIS — Z79899 Other long term (current) drug therapy: Secondary | ICD-10-CM | POA: Diagnosis not present

## 2017-05-27 DIAGNOSIS — Z515 Encounter for palliative care: Secondary | ICD-10-CM | POA: Diagnosis not present

## 2017-05-27 DIAGNOSIS — E039 Hypothyroidism, unspecified: Secondary | ICD-10-CM | POA: Diagnosis not present

## 2017-05-27 DIAGNOSIS — I714 Abdominal aortic aneurysm, without rupture: Secondary | ICD-10-CM | POA: Diagnosis not present

## 2017-05-27 DIAGNOSIS — M7989 Other specified soft tissue disorders: Secondary | ICD-10-CM | POA: Diagnosis not present

## 2017-05-27 DIAGNOSIS — N183 Chronic kidney disease, stage 3 (moderate): Secondary | ICD-10-CM | POA: Diagnosis not present

## 2017-05-27 DIAGNOSIS — I4891 Unspecified atrial fibrillation: Secondary | ICD-10-CM | POA: Diagnosis not present

## 2017-05-27 DIAGNOSIS — E034 Atrophy of thyroid (acquired): Secondary | ICD-10-CM | POA: Diagnosis not present

## 2017-05-27 DIAGNOSIS — I482 Chronic atrial fibrillation: Secondary | ICD-10-CM | POA: Diagnosis not present

## 2017-05-27 DIAGNOSIS — I110 Hypertensive heart disease with heart failure: Secondary | ICD-10-CM | POA: Diagnosis not present

## 2017-05-27 DIAGNOSIS — R1312 Dysphagia, oropharyngeal phase: Secondary | ICD-10-CM | POA: Diagnosis not present

## 2017-05-27 DIAGNOSIS — Z888 Allergy status to other drugs, medicaments and biological substances status: Secondary | ICD-10-CM | POA: Diagnosis not present

## 2017-05-27 DIAGNOSIS — K219 Gastro-esophageal reflux disease without esophagitis: Secondary | ICD-10-CM | POA: Diagnosis not present

## 2017-05-27 DIAGNOSIS — G9340 Encephalopathy, unspecified: Secondary | ICD-10-CM | POA: Diagnosis not present

## 2017-05-27 DIAGNOSIS — E1122 Type 2 diabetes mellitus with diabetic chronic kidney disease: Secondary | ICD-10-CM | POA: Diagnosis not present

## 2017-05-27 DIAGNOSIS — R488 Other symbolic dysfunctions: Secondary | ICD-10-CM | POA: Diagnosis not present

## 2017-05-27 DIAGNOSIS — R627 Adult failure to thrive: Secondary | ICD-10-CM | POA: Diagnosis not present

## 2017-05-27 DIAGNOSIS — M109 Gout, unspecified: Secondary | ICD-10-CM | POA: Diagnosis not present

## 2017-05-27 LAB — BASIC METABOLIC PANEL
Anion gap: 8 (ref 5–15)
BUN: 15 mg/dL (ref 6–20)
CHLORIDE: 113 mmol/L — AB (ref 101–111)
CO2: 20 mmol/L — AB (ref 22–32)
CREATININE: 1.1 mg/dL — AB (ref 0.44–1.00)
Calcium: 8.4 mg/dL — ABNORMAL LOW (ref 8.9–10.3)
GFR calc non Af Amer: 45 mL/min — ABNORMAL LOW (ref >60)
GFR, EST AFRICAN AMERICAN: 52 mL/min — AB (ref >60)
GLUCOSE: 116 mg/dL — AB (ref 65–99)
Potassium: 3.8 mmol/L (ref 3.5–5.1)
Sodium: 141 mmol/L (ref 135–145)

## 2017-05-27 MED ORDER — ISOSORB DINITRATE-HYDRALAZINE 20-37.5 MG PO TABS: 2.0000 | ORAL_TABLET | Freq: Three times a day (TID) | ORAL | 0 refills | Status: DC

## 2017-05-27 MED ORDER — ENSURE ENLIVE PO LIQD: 237.0000 mL | Freq: Two times a day (BID) | ORAL | 12 refills | Status: DC

## 2017-05-27 NOTE — Progress Notes (Signed)
Patient will DC to: [facility] Anticipated DC date: 05/27/17 Family notified: Daughter Transport by: Daughter by car   Per MD patient ready for DC to [facility]. RN, patient, patient's family, and facility notified of DC. Discharge Summary sent to facility. RN given number for report [phone] Room 223).   CSW signing off.  Cedric Fishman, [place] Social Worker [phone]

## 2017-05-27 NOTE — Discharge Instructions (Signed)
Confusion Confusion is the inability to think with your usual speed or clarity. Confusion may come on quickly or slowly over time. How quickly the confusion comes on depends on the cause. Confusion can be due to any number of causes. What are the causes?  Concussion, head injury, or head trauma.  Seizures.  Stroke.  Fever.  Brain tumor.  Age related decreased brain function (dementia).  Heightened emotional states like rage or terror.  Mental illness in which the person loses the ability to determine what is real and what is not (hallucinations).  Infections such as a urinary tract infection (UTI).  Toxic effects from alcohol, drugs, or prescription medicines.  Dehydration and an imbalance of salts in the body (electrolytes).  Lack of sleep.  Low blood sugar (diabetes).  Low levels of oxygen from conditions such as chronic lung disorders.  Drug interactions or other medicine side effects.  Nutritional deficiencies, especially niacin, thiamine, vitamin C, or vitamin B.  Sudden drop in body temperature (hypothermia).  Change in routine, such as when traveling or hospitalized. What are the signs or symptoms? People often describe their thinking as cloudy or unclear when they are confused. Confusion can also include feeling disoriented. That means you are unaware of where or who you are. You may also not know what the date or time is. If confused, you may also have difficulty paying attention, remembering, and making decisions. Some people also act aggressively when they are confused. How is this diagnosed? The medical evaluation of confusion may include:  Blood and urine tests.  X-rays.  Brain and nervous system tests.  Analyzing your brain waves (electroencephalogram or EEG).  Magnetic resonance imaging (MRI) of your head.  Computed tomography (CT) scan of your head.  Mental status tests in which your health care provider may ask many questions. Some of these  questions may seem silly or strange, but they are a very important test to help diagnose and treat confusion.  How is this treated? An admission to the hospital may not be needed, but a person with confusion should not be left alone. Stay with a family member or friend until the confusion clears. Avoid alcohol, pain relievers, or sedative drugs until you have fully recovered. Do not drive until directed by your health care provider. Follow these instructions at home: What family and friends can do:  To find out if someone is confused, ask the person to state his or her name, age, and the date. If the person is unsure or answers incorrectly, he or she is confused.  Always introduce yourself, no matter how well the person knows you.  Often remind the person of his or her location.  Place a calendar and clock near the confused person.  Help the person with his or her medicines. You may want to use a pill box, an alarm as a reminder, or give the person each dose as prescribed.  Talk about current events and plans for the day.  Try to keep the environment calm, quiet, and peaceful.  Make sure the person keeps follow-up visits with his or her health care provider.  How is this prevented? Ways to prevent confusion:  Avoid alcohol.  Eat a balanced diet.  Get enough sleep.  Take medicine only as directed by your health care provider.  Do not become isolated. Spend time with other people and make plans for your days.  Keep careful watch on your blood sugar levels if you are diabetic.  Get help right  away if:  You develop severe headaches, repeated vomiting, seizures, blackouts, or slurred speech.  There is increasing confusion, weakness, numbness, restlessness, or personality changes.  You develop a loss of balance, have marked dizziness, feel uncoordinated, or fall.  You have delusions, hallucinations, or develop severe anxiety.  Your family members think you need to be  rechecked. This information is not intended to replace advice given to you by your health care provider. Make sure you discuss any questions you have with your health care provider. Document Released: 08/16/2004 Document Revised: 01/27/2016 Document Reviewed: 08/14/2013 Elsevier Interactive Patient Education  2018 [employer]. Urinary Tract Infection, Adult A urinary tract infection (UTI) is an infection of any part of the urinary tract, which includes the kidneys, ureters, bladder, and urethra. These organs make, store, and get rid of urine in the body. UTI can be a bladder infection (cystitis) or kidney infection (pyelonephritis). What are the causes? This infection may be caused by fungi, viruses, or bacteria. Bacteria are the most common cause of UTIs. This condition can also be caused by repeated incomplete emptying of the bladder during urination. What increases the risk? This condition is more likely to develop if:  You ignore your need to urinate or hold urine for long periods of time.  You do not empty your bladder completely during urination.  You wipe back to front after urinating or having a bowel movement, if you are female.  You are uncircumcised, if you are female.  You are constipated.  You have a urinary catheter that stays in place (indwelling).  You have a weak defense (immune) system.  You have a medical condition that affects your bowels, kidneys, or bladder.  You have diabetes.  You take antibiotic medicines frequently or for long periods of time, and the antibiotics no longer work well against certain types of infections (antibiotic resistance).  You take medicines that irritate your urinary tract.  You are exposed to chemicals that irritate your urinary tract.  You are female.  What are the signs or symptoms? Symptoms of this condition include:  Fever.  Frequent urination or passing small amounts of urine frequently.  Needing to urinate  urgently.  Pain or burning with urination.  Urine that smells bad or unusual.  Cloudy urine.  Pain in the lower abdomen or back.  Trouble urinating.  Blood in the urine.  Vomiting or being less hungry than normal.  Diarrhea or abdominal pain.  Vaginal discharge, if you are female.  How is this diagnosed? This condition is diagnosed with a medical history and physical exam. You will also need to provide a urine sample to test your urine. Other tests may be done, including:  Blood tests.  Sexually transmitted disease (STD) testing.  If you have had more than one UTI, a cystoscopy or imaging studies may be done to determine the cause of the infections. How is this treated? Treatment for this condition often includes a combination of two or more of the following:  Antibiotic medicine.  Other medicines to treat less common causes of UTI.  Over-the-counter medicines to treat pain.  Drinking enough water to stay hydrated.  Follow these instructions at home:  Take over-the-counter and prescription medicines only as told by your health care provider.  If you were prescribed an antibiotic, take it as told by your health care provider. Do not stop taking the antibiotic even if you start to feel better.  Avoid alcohol, caffeine, tea, and carbonated beverages. They can irritate your  bladder.  Drink enough fluid to keep your urine clear or pale yellow.  Keep all follow-up visits as told by your health care provider. This is important.  Make sure to: ? Empty your bladder often and completely. Do not hold urine for long periods of time. ? Empty your bladder before and after sex. ? Wipe from front to back after a bowel movement if you are female. Use each tissue one time when you wipe. Contact a health care provider if:  You have back pain.  You have a fever.  You feel nauseous or vomit.  Your symptoms do not get better after 3 days.  Your symptoms go away and then  return. Get help right away if:  You have severe back pain or lower abdominal pain.  You are vomiting and cannot keep down any medicines or water. This information is not intended to replace advice given to you by your health care provider. Make sure you discuss any questions you have with your health care provider. Document Released: 04/18/2005 Document Revised: 12/21/2015 Document Reviewed: 05/30/2015 Elsevier Interactive Patient Education  2017 [employer].

## 2017-05-27 NOTE — Discharge Summary (Signed)
Physician Discharge Summary  Nicole [name] OYD:[id number] DOB: [birth year] DOA: 05/23/2017  PCP: [name], DO  Admit date: 05/23/2017 Discharge date: 05/27/2017  Time spent: 45 minutes  Recommendations for Outpatient Follow-up:  Patient will be discharged to [facility]. Continue physical, occupational, speech thearpy.  Patient will need to follow up with primary care provider within one week of discharge, repeat BMP in one week, and repeat thyroid function testing in 4-6 weeks.  Patient should continue medications as prescribed.  Patient should follow a dysphagia 2 diet.   Discharge Diagnoses:  Acute metabolic encephalopathy Acute kidney injury on chronic kidney disease, stage III Possible urinary tract infection Vascular dementia Essential hypertension Hypothyroidism   Discharge Condition: Stable  Diet recommendation: Dysphagia 2  Filed Weights   05/23/17 0045  Weight: 60.8 kg (134 lb)    History of present illness:  On 05/23/2017 by Dr. Vladimir Creeks Butleris a 83 y.o.femalewith medical history significant of vascular dementia, A.Fib, accelerated HTN, CNS amyloid bleeds in June of this year. Patient brought in by family yesterday due to ? Chest pain, although some of the notes make it sound more like an adult failure to thrive issue with 14lb weight loss in past 2 weeks since son died. She was started on labetalol yesterday in ED for SBP 200, discharged home. Today in addition to not eating, she essentially didn't even get out of bed. Family notes foul smelling urine and suspects UTI.  Hospital Course:  Acute metabolic encephalopathy -Possibly secondary to urinary tract infection versus worsening dementia versus possible CVA -appears to have resolved- not sure what patient's baseline -Friend at bedside and states patient is at her baseline.  -UA many bacteria, 6-30 WBCs, negative nitrites leukocytes -Urine culture listed  below -MRI brain: No acute reversible finding on MRI.  Acute kidney injury on chronic kidney disease, stage III -baseline creatinine 1.2, currently 1.73 -Creatinine currently 1.10 -continue IVF and monitor BMP  Possible urinary tract infection -UA as above -Urine culture >100K lactobacillus species (discussed with pharmacy, likely contaminant) -Was placed on ceftriaxone and completed course during hospitalization   Vascular dementia -Continue Aricept   Essential hypertension -continue labetalol, BiDil  -per outpatient notes from 02/2012, patient was supposed to be on Bidil, lisinopril, labetaolol -lisinopril held due to AKI  Hypothyroidism  -Continue Synthroid  -TSH 0.240, FT4 1.22 -given acute illness, patient should follow up with her PCP for repeat testing  Procedures:None  Consultations:None  Discharge Exam: Vitals:   05/27/17 0610 05/27/17 0745  BP: (!) 168/132 (!) 159/99  Pulse: (!) 105 99  Resp: 17   Temp: 97.9 F (36.6 C)   SpO2: 98%    Patient denies chest pain, shortness of breath, abdominal pain, N/V/D/C.    General: Well developed, elderly, NAD  HEENT: NCAT, mucous membranes moist.  Cardiovascular: S1 S2 auscultated, irregular  Respiratory: Clear to auscultation bilaterally with equal chest rise  Abdomen: Soft, nontender, nondistended, + bowel sounds  Extremities: warm dry without cyanosis clubbing or edema  Neuro: AAOx3 (self, place, month), nonfocal  Psych: Appropriate mood and affect, pleasant   Discharge Instructions Discharge Instructions    Discharge instructions   Complete by:  As directed    Patient will be discharged to [facility]. Continue physical, occupational, speech thearpy.  Patient will need to follow up with primary care provider within one week of discharge, repeat BMP in one week, and repeat thyroid function testing in 4-6 weeks.  Patient should continue  medications as prescribed.  Patient should  follow a dysphagia 2 diet.     Current Discharge Medication List    START taking these medications   Details  feeding supplement, ENSURE ENLIVE, (ENSURE ENLIVE) LIQD Take 237 mLs 2 (two) times daily between meals by mouth. Qty: 237 mL, Refills: 12    isosorbide-hydrALAZINE (BIDIL) 20-37.5 MG tablet Take 2 tablets 3 (three) times daily by mouth. Qty: 180 tablet, Refills: 0      CONTINUE these medications which have NOT CHANGED   Details  allopurinol (ZYLOPRIM) 100 MG tablet TAKE 1 TABLET BY MOUTH  DAILY Qty: 90 tablet, Refills: 1    donepezil (ARICEPT) 10 MG tablet Take one tablet by mouth once daily at bedtime Qty: 90 tablet, Refills: 3   Associated Diagnoses: Vascular dementia without behavioral disturbance    ferrous sulfate 325 (65 FE) MG EC tablet Take 1 tablet (325 mg total) by mouth 2 (two) times daily with a meal. Qty: 60 tablet, Refills: 3    furosemide (LASIX) 20 MG tablet Take 1 tablet (20 mg) by mouth every other day, may also take one additional tablet daily for weight gain greater than 3 lbs. Qty: 90 tablet, Refills: 3    labetalol (NORMODYNE) 200 MG tablet Take 1 tablet (200 mg total) by mouth 2 (two) times daily. Qty: 60 tablet, Refills: 0    levothyroxine (SYNTHROID, LEVOTHROID) 50 MCG tablet TAKE 1 TABLET BY MOUTH  DAILY BEFORE BREAKFAST Qty: 90 tablet, Refills: 1    potassium chloride (K-DUR) 10 MEQ tablet Take 1 tablet (10 mEq total) by mouth every other day. Qty: 45 tablet, Refills: 3    sertraline (ZOLOFT) 50 MG tablet Take 1 tablet (50 mg total) by mouth daily. Qty: 30 tablet, Refills: 2   Associated Diagnoses: Anxiety      STOP taking these medications     potassium chloride SA (K-DUR,KLOR-CON) 20 MEQ tablet        Allergies  Allergen Reactions  . Iohexol Nausea And Vomiting  . Iodinated Diagnostic Agents Nausea And Vomiting  . Z-Pak [Azithromycin]     Reaction unknown by family    Contact information for follow-up providers    [name],  [name], DO. Schedule an appointment as soon as possible for a visit in 1 week(s).   Specialty:  Geriatric Medicine Why:  Hospital follow up. Discuss depression and possible referral to psychiatry or psychology. Contact information: [address] [place] [address] [phone]            Contact information for after-discharge care    Destination    [facility] Follow up.   Service:  Skilled Nursing Contact information: [address] [place] [phone]                   The results of significant diagnostics from this hospitalization (including imaging, microbiology, ancillary and laboratory) are listed below for reference.    Significant Diagnostic Studies: Dg Chest 2 View  Result Date: 05/23/2017 CLINICAL DATA:  Shortness of breath EXAM: CHEST  2 VIEW COMPARISON:  Chest radiograph 05/21/2017 FINDINGS: Massive cardiomegaly is unchanged with prominence of the pulmonary artery. There is calcific aortic atherosclerosis. Left mid lung pulmonary nodular opacities are unchanged. No focal airspace consolidation or pulmonary edema. Small right pleural effusion. IMPRESSION: 1. Cardiomegaly and enlarged pulmonary artery with aortic atherosclerosis (ICD10-I70.0). 2. Small right pleural effusion, unchanged. Electronically Signed   By: Cletus Gash.D.  On: 05/23/2017 01:43   Dg Chest 2 View  Result Date: 05/21/2017 CLINICAL DATA:  RIGHT-sided chest pain began 3 days ago. EXAM: CHEST  2 VIEW COMPARISON:  01/11/2017. FINDINGS: Cardiomegaly. RIGHT pleural effusion. No consolidation or pulmonary edema. No pneumothorax. Thoracic atherosclerosis. Similar appearance to priors. No acute osseous findings. IMPRESSION: RIGHT pleural effusion similar to priors. Cardiomegaly. No consolidation or edema. Electronically Signed   By: Staci Righter M.D.   On: 05/21/2017 19:17   Ct Head Wo Contrast  Result Date: 05/23/2017 CLINICAL  DATA:  Weakness.  History of colon cancer. EXAM: CT HEAD WITHOUT CONTRAST TECHNIQUE: Contiguous axial images were obtained from the base of the skull through the vertex without intravenous contrast. COMPARISON:  Brain MRI 01/12/2017 FINDINGS: Brain: No mass lesion, intraparenchymal hemorrhage or extra-axial collection. No evidence of acute cortical infarct. There is periventricular hypoattenuation compatible with chronic microvascular disease. 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 Vascular: No hyperdense vessel or unexpected calcification. Skull: Normal visualized skull base, calvarium and extracranial soft tissues. Sinuses/Orbits: No sinus fluid levels or advanced mucosal thickening. No mastoid effusion. Normal orbits. 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 Electronically Signed   By: Ulyses Jarred M.D.   On: 05/23/2017 04:10   Mr Brain Wo Contrast  Result Date: 05/23/2017 CLINICAL DATA:  History of vascular dementia. Weight loss and failure to thrive, worsening over the last 2 weeks. EXAM: MRI HEAD WITHOUT CONTRAST TECHNIQUE: Multiplanar, multiecho pulse sequences of the brain and surrounding structures were obtained without intravenous contrast. COMPARISON:  CT 05/23/2017.  MRI 01/12/2017. FINDINGS: Brain: Diffusion imaging does not show any acute or subacute infarction. Chronic small-vessel ischemic changes are seen throughout pons. Few old small vessel cerebellar infarctions. Cerebral hemispheres show extensive chronic small-vessel ischemic changes throughout the thalami, basal ganglia and hemispheric white matter. Many of the old infarctions are associated with hemosiderin deposition. More extensive old hemorrhagic infarction in the right basal ganglia an the left frontal subcortical white matter. No evidence of mass lesion. No sign of acute hemorrhage or edema. No hydrocephalus or extra-axial collection.  Vascular: Major vessels at the base of the brain show flow. Skull and upper cervical spine: Negative Sinuses/Orbits: Clear/normal Other: None significant IMPRESSION: No acute or reversible finding by MRI. Extensive chronic ischemic changes throughout the brain, many associated with hemosiderin deposition, fully described above. Electronically Signed   By: Nelson Chimes M.D.   On: 05/23/2017 11:03    Microbiology: Recent Results (from the past 240 hour(s))  Urine culture     Status: Abnormal   Collection Time: 05/23/17  1:56 AM  Result Value Ref Range Status   Specimen Description Urine  Final   Special Requests NONE  Final   Culture (A)  Final    >=100,000 COLONIES/mL LACTOBACILLUS SPECIES Standardized susceptibility testing for this organism is not available.    Report Status 05/25/2017 FINAL  Final     Labs: Basic Metabolic Panel: Recent Labs  Lab 05/23/17 0156 05/24/17 0347 05/25/17 0711 05/26/17 0556 05/27/17 0549  NA 143 144 142 142 141  K 3.7 3.2* 3.4* 3.7 3.8  CL 106 111 114* 113* 113*  CO2 26 25 21* 22 20*  GLUCOSE 151* 137* 109* 118* 116*  BUN 20 23* 18 14 15   CREATININE 1.78* 1.73* 1.31* 1.13* 1.10*  CALCIUM 8.7* 8.0* 8.1* 8.5* 8.4*   Liver Function Tests: No results for input(s): AST, ALT, ALKPHOS, BILITOT, PROT, ALBUMIN in the last 168 hours. No results for input(s):  LIPASE, AMYLASE in the last 168 hours. No results for input(s): AMMONIA in the last 168 hours. CBC: Recent Labs  Lab 05/21/17 1818 05/23/17 0156 05/24/17 0347 05/25/17 0711  WBC 6.7 7.5 7.1 6.6  NEUTROABS  --  6.2  --   --   HGB 13.2 13.0 10.6* 11.0*  HCT 37.9 37.5 31.0* 32.3*  MCV 74.0* 74.0* 73.8* 75.1*  PLT 247 231 180 171   Cardiac Enzymes: No results for input(s): CKTOTAL, CKMB, CKMBINDEX, TROPONINI in the last 168 hours. BNP: BNP (last 3 results) Recent Labs    08/22/16 2256  BNP 490.8*    ProBNP (last 3 results) No results for input(s): PROBNP in the last 8760  hours.  CBG: No results for input(s): GLUCAP in the last 168 hours.     SignedCristal Ford  [facility] 05/27/2017, 12:04 PM

## 2017-05-27 NOTE — Clinical Social Work Placement (Signed)
   CLINICAL SOCIAL WORK PLACEMENT  NOTE  Date:  05/27/2017  Patient Details  Name: Nicole [name] MRN: [id number] Date of Birth: 1930-11-17  Clinical Social Work is seeking post-discharge placement for this patient at the [place] level of care (*CSW will initial, date and re-position this form in  chart as items are completed):  Yes   Patient/family provided with [place] Work Department's list of facilities offering this level of care within the geographic area requested by the patient (or if unable, by the patient's family).  Yes   Patient/family informed of their freedom to choose among providers that offer the needed level of care, that participate in Medicare, Medicaid or managed care program needed by the patient, have an available bed and are willing to accept the patient.  Yes   Patient/family informed of [facility]'s ownership interest in [facility] and [facility], as well as of the fact that they are under no obligation to receive care at these facilities.  PASRR submitted to EDS on 05/25/17     PASRR number received on       Existing PASRR number confirmed on       FL2 transmitted to all facilities in geographic area requested by pt/family on 05/25/17     FL2 transmitted to all facilities within larger geographic area on       Patient informed that his/her managed care company has contracts with or will negotiate with certain facilities, including the following:        Yes   Patient/family informed of bed offers received.  Patient chooses bed at [facility] recommends and patient chooses bed at      Patient to be transferred to [facility] on 05/27/17.  Patient to be transferred to facility by car     Patient family notified on 05/27/17 of transfer.  Name of family member notified:  Daughter     PHYSICIAN       Additional Comment:     _______________________________________________ Benard Halsted, [place] 05/27/2017, 1:55 PM

## 2017-05-27 NOTE — Progress Notes (Addendum)
Patient was discharged to nursing home [facility]) by MD order; discharged instructions review and sent to facility with care notes with patient's daughter; IV DIC; skin intact; facility was called and report was given to the nurse who is going to receive the patient; patient will be transported to facility by family.

## 2017-05-27 NOTE — Progress Notes (Signed)
Occupational Therapy Treatment Patient Details Name: Nicole [name] MRN: [id number] DOB: [birth year] Today's Date: 05/27/2017    History of present illness 83 y.o.femalewith medical history significant of vascular dementia, A.Fib, accelerated HTN, CNS amyloid bleeds in June of this year. Patient brought in by family yesterday due to ? Chest pain, although some of the notes make it sound more like an adult failure to thrive issue with 14lb weight loss in past 2 weeks since son died. MRI on 2017-06-20 was negative.   OT comments  Pt progressing towards goals, completing room level functional mobility at RW level with MinA. Pt completed grooming ADLs seated at sink with MinGuard-MinA. Completed toileting from sit<>stand level at RW using [facility] with MaxA for task completion. Pt continues to fatigue quickly, requires seated rest breaks throughout. Feel POC and SNF recommendation after discharge remains appropriate at this time. Will continue to follow acutely to progress Pt's safety and independence with ADLs and mobility.    Follow Up Recommendations  SNF;Supervision/Assistance - 24 hour    Equipment Recommendations  Other (comment)(defer to next venue )          Precautions / Restrictions Precautions Precautions: Fall Precaution Comments: pt denies any recent falls Restrictions Weight Bearing Restrictions: No       Mobility Bed Mobility               General bed mobility comments: OOB in recliner upon arrival   Transfers Overall transfer level: Needs assistance Equipment used: Rolling walker (2 wheeled) Transfers: Sit to/from Stand Sit to Stand: Mod assist         General transfer comment: Pt requires assist to boost into standing and to steady, verbal cues for hand placement, requires assist for controlled descent to sitting; completed sit<>stand x3 during session, 1x from recliner and 2x from [place] Overall balance assessment: Needs  assistance Sitting-balance support: No upper extremity supported;Feet supported Sitting balance-Leahy Scale: Fair     Standing balance support: Single extremity supported;During functional activity Standing balance-Leahy Scale: Poor Standing balance comment: Requiring UE support                           ADL either performed or assessed with clinical judgement   ADL Overall ADL's : Needs assistance/impaired     Grooming: Min guard;Sitting;Wash/dry face;Minimal assistance;Oral care Grooming Details (indicate cue type and reason): Pt completed seated grooming on BSC placed in front of sink; MinGuard with intermittent MinA as Pt attempting to lean back and BSC without back support                 Toilet Transfer: Ambulation;BSC;RW;Maximal assistance Toilet Transfer Details (indicate cue type and reason): Pt needing to toilet after grooming ADLs (already sitting on BSC in front of sink), completed sit<>stand for clothing management prior to return to sitting on BSC for task completion  Toileting- Clothing Manipulation and Hygiene: Maximal assistance;Sit to/from stand Toileting - Clothing Manipulation Details (indicate cue type and reason): Pt requires assist for clothing management and peri-care after voiding bladder      Functional mobility during ADLs: Minimal assistance;Rolling walker General ADL Comments: Pt completed room level functional mobility with RW and MinA to complete seated grooming ADLs and toileting; requires increased time and rest breaks secondary to fatigue                        Cognition Arousal/Alertness: Lethargic Behavior  During Therapy: Flat affect Overall Cognitive Status: History of cognitive impairments - at baseline                                 General Comments: baseline dementia. Per PT note from 11/4 Pt family reporting decrease in cognition and arousal in last week                    General Comments  family present during session     Pertinent Vitals/ Pain       Pain Assessment: No/denies pain                                                          Frequency  Min 2X/week        Progress Toward Goals  OT Goals(current goals can now be found in the care plan section)  Progress towards OT goals: Progressing toward goals  Acute Rehab OT Goals Patient Stated Goal: Family goal "to get stronger" OT Goal Formulation: With patient/family Time For Goal Achievement: 06/08/17 Potential to Achieve Goals: Good   AM-PAC PT "6 Clicks" Daily Activity     Outcome Measure   Help from another person eating meals?: A Little Help from another person taking care of personal grooming?: A Little Help from another person toileting, which includes using toliet, bedpan, or urinal?: A Lot Help from another person bathing (including washing, rinsing, drying)?: A Lot Help from another person to put on and taking off regular upper body clothing?: A Little Help from another person to put on and taking off regular lower body clothing?: A Lot 6 Click Score: 15    End of Session Equipment Utilized During Treatment: Gait belt;Rolling walker  OT Visit Diagnosis: Unsteadiness on feet (R26.81);Other abnormalities of gait and mobility (R26.89);Muscle weakness (generalized) (M62.81);Other symptoms and signs involving cognitive function   Activity Tolerance Patient tolerated treatment well   Patient Left in chair;with chair alarm set;with call bell/phone within reach;with family/visitor present   Nurse Communication Mobility status;Other (comment)(NT, check on pure wick )        Time: 0355-9741 OT Time Calculation (min): 35 min  Charges: OT General Charges $OT Visit: 1 Visit OT Treatments $Self Care/Home Management : 23-37 mins  Lou Cal, [place] Pager [phone] 05/27/2017    Raymondo Band 05/27/2017, 2:46 PM

## 2017-05-27 NOTE — Consult Note (Signed)
   [facility] CM Inpatient Consult   05/27/2017  Nicole [name] 02/12/1931 [id number]  Patient was screened for [facility] in the [place] for post hospital transition needs. Patient had been involved with [facility] Telephonic RN with the EMMI stroke calls follow up.  Patient is currently being transitioned to skilled nursing care to [facility] per notes.  Came by to speak with the patient's daughter.  Patient is in recliner and caregiver at bedside.  Gave her the [facility] brochure, 24 hour nurse advise line and contact information.  Will assign [facility] social worker as a follow up contact to skill nursing facility. For questions, please contact:  Natividad Brood, RN BSN [facility] Liaison  [phone] business mobile phone Toll free office [phone]

## 2017-05-28 ENCOUNTER — Non-Acute Institutional Stay (SKILLED_NURSING_FACILITY): Payer: Medicare Other | Admitting: Internal Medicine

## 2017-05-28 ENCOUNTER — Encounter: Payer: Self-pay | Admitting: Internal Medicine

## 2017-05-28 ENCOUNTER — Other Ambulatory Visit: Payer: Self-pay | Admitting: Licensed Clinical Social Worker

## 2017-05-28 DIAGNOSIS — R627 Adult failure to thrive: Secondary | ICD-10-CM | POA: Insufficient documentation

## 2017-05-28 DIAGNOSIS — I110 Hypertensive heart disease with heart failure: Secondary | ICD-10-CM

## 2017-05-28 DIAGNOSIS — F329 Major depressive disorder, single episode, unspecified: Secondary | ICD-10-CM | POA: Insufficient documentation

## 2017-05-28 DIAGNOSIS — E034 Atrophy of thyroid (acquired): Secondary | ICD-10-CM | POA: Diagnosis not present

## 2017-05-28 DIAGNOSIS — F332 Major depressive disorder, recurrent severe without psychotic features: Secondary | ICD-10-CM | POA: Diagnosis not present

## 2017-05-28 DIAGNOSIS — Z515 Encounter for palliative care: Secondary | ICD-10-CM | POA: Diagnosis not present

## 2017-05-28 DIAGNOSIS — I5030 Unspecified diastolic (congestive) heart failure: Secondary | ICD-10-CM

## 2017-05-28 NOTE — Assessment & Plan Note (Signed)
Decrease L-thyroxine dose to 0.25 g

## 2017-05-28 NOTE — Patient Outreach (Signed)
[facility]) Care Management  [facility] Social Work  05/28/2017  Nicole [name] [birth year] [id number]  Encounter Medications:  Outpatient Encounter Medications as of 05/28/2017  Medication Sig  . allopurinol (ZYLOPRIM) 100 MG tablet TAKE 1 TABLET BY MOUTH  DAILY  . donepezil (ARICEPT) 10 MG tablet Take one tablet by mouth once daily at bedtime  . feeding supplement, ENSURE ENLIVE, (ENSURE ENLIVE) LIQD Take 237 mLs 2 (two) times daily between meals by mouth.  . ferrous sulfate 325 (65 FE) MG EC tablet Take 1 tablet (325 mg total) by mouth 2 (two) times daily with a meal.  . furosemide (LASIX) 20 MG tablet Take 1 tablet (20 mg) by mouth every other day, may also take one additional tablet daily for weight gain greater than 3 lbs.  . isosorbide-hydrALAZINE (BIDIL) 20-37.5 MG tablet Take 2 tablets 3 (three) times daily by mouth.  . labetalol (NORMODYNE) 200 MG tablet Take 1 tablet (200 mg total) by mouth 2 (two) times daily.  Marland Kitchen levothyroxine (SYNTHROID, LEVOTHROID) 50 MCG tablet TAKE 1 TABLET BY MOUTH  DAILY BEFORE BREAKFAST  . potassium chloride (K-DUR) 10 MEQ tablet Take 1 tablet (10 mEq total) by mouth every other day.  . sertraline (ZOLOFT) 50 MG tablet Take 1 tablet (50 mg total) by mouth daily.   No facility-administered encounter medications on file as of 05/28/2017.     Functional Status:  In your present state of health, do you have any difficulty performing the following activities: 05/28/2017 05/23/2017  Hearing? N N  Vision? N N  Difficulty concentrating or making decisions? [name]  Walking or climbing stairs? Y Y  Dressing or bathing? Y Y  Doing errands, shopping? [name]  Some recent data might be hidden    Fall/Depression Screening:  PHQ 2/9 Scores 05/28/2017 11/08/2016 05/24/2016 01/20/2016 07/01/2015 06/10/2014 04/14/2013  PHQ - 2 Score 3 0 1 0 0 0 0  PHQ- 9 Score 7 - - - - - -    Assessment: CSW completed SNF visit on 05/28/17 at [facility] with patient and  patient's caregiver [name]. CSW introduced self, reason for visit and of [place]. Patient is agreeable to services and signed consent. Patient's caregiver [name] has been working with her since 2016 after patient had a stroke. Patient reports that her daughter came and visited her last night when she arrived. Patient reports that she has suffered from several losses over the last few years. Patient states that she lost her oldest son 4 years ago, one of her son's at the end of June this year and her last son on 04/22/17. Caregiver feels that it would beneficial to patient to gain grief support resources. CSW completed entire review of these. Patient was appreciative of review. Patient reports that she does not have much an appetite which caregiver feels is contributed to her grief. Patient has a strong support network of family and friends. Patient's caregiver [name] comes Monday-Friday for 5 hours to provide care. Patient has a live in companion [name] who is with her every night and prepares most of her meals. Patient has a god daughter [name] that manages all of patient's finances and fills her pill boxes for [name] to provide to patient. Patient use to have other aides but services were discontinued. Patient reports "I'm never home by myself. I always have someone with me." Caregiver reports that the live in companion has bowling a few days per week and she will come  in during that time to provide assistance as well. Patient goes to church weekly. CSW provided education on senior resources within [facility] as well as socialization opportunities. Patient recently visited [facility] with caregiver and reports interest in program. Patient is planning on singing and playing the piano for residents at the facility this week or the next. Patient denies any transportation concerns and denied needing to adjust her advance directives. CNA visited with patient her her  blood pressure was 148/45 and her oxygen was at 95%. Both patient and caregiver report that patient will be returning home and will not be staying at facility long term. Caregiver reported that patient was fairly independent before hospitalization on 05/21/17. Caregiver reports that patient was able to bathe herself, dress herself and fix her hair. Caregiver reports that patient has some memory loss as well. Family appreciative of SNF visit.   CSW unable to meet with SNF social worker as she was not currently available.  [facility]N CM Care Plan Problem One     Most Recent Value  Care Plan Problem One  SNF admission  Role Documenting the Problem One  Clinical Social Worker  Care Plan for Problem One  Active  [facility] Long Term Goal   Patient will have a safe and stable discharge back home within 90 days as evidenced by patient's reporting  THN Long Term Goal Start Date  05/28/17  Interventions for Problem One Long Term Goal  CSW completed SNF visit and met with patient and her caregiver. CSW will coordiate care between SNF social worker, family and patient to ensure that she has a safe discharge back home with the needed medical equipment, services and resources. CSW will provide appropriate social work interventions to help support patient.  [facility] CM Short Term Goal #1   Patient will attend all scheduled medical appointments within 30 days at SNF as evidenced by patient's reporting  [facility] CM Short Term Goal #1 Start Date  05/28/17  Interventions for Short Term Goal #1  CSW will help support patient to meet this goal by providing motivational interviewing interventions and encourage her to attend all scheduled appointments to help build her strength which in turn will allow her to have a safe discharge back home.  [facility]N CM Short Term Goal #2   Patient will be able to read back 1 community resource within [facility] within 30 days  [facility] CM Short Term Goal #2 Start Date  05/28/17  Interventions for Short Term Goal  #2  CSW completed review of Senior Resources within [facility] during visit today. Patient was aware of [facility] and is interested in going there after her expected discharge. CSW provided patient with a handout of listed resources and will review as needed.     Plan: CSW will route encounter to PCP. CSW will follow up with SNF within one week.  Eula Fried, BSW, MSW, [email or web address] Phone: [phone] Fax: [phone]

## 2017-05-28 NOTE — Progress Notes (Addendum)
NURSING HOME LOCATION:  [facility] ROOM NUMBER:  [id number]  CODE STATUS:  Full Code  PCP:  [name], DO  [place] [id number]   This is a comprehensive admission note to [facility] performed on this date less than 30 days from date of admission. Included are preadmission medical/surgical history;reconciled medication list; family history; social history and comprehensive review of systems.  Corrections and additions to the records were documented . Comprehensive physical exam was also performed. Additionally a clinical summary was entered for each active diagnosis pertinent to this admission in the Problem List to enhance continuity of care.  HPI: The patient was seen in the ED 62/83 with systolic blood pressure of 200, labetalol was initiated & the patient sent home. She returned to the ED 11/1 with anorexia and lethargy. The patient essentially had stayed in bed since the initial ED visit. The patient's family described foul-smelling urine, the patient had no GU complaints Patient was hospitalized 11/1-11/5/18 with mental status changes thought to be secondary to urinary tract infection vs progressive dementia versus possible CVA.The acute symptoms did seem to resolve while hospitalized, the patient's baseline was indefinite. According to a friend at the time of discharge patient was at her baseline. The MRI did not reveal an acute reversible process. Acute kidney injury was present superimposed on chronic kidney disease stage III. Urine culture revealed 100,000 lactobacillus species felt to be contaminant. The patient did complete a course of ceftriaxone while hospitalized. She was thought to have baseline vascular dementia and Aricept was continued. Labetalol and BiDil were continued for hypertension.The ACE inhibitor was held because of the AKI. The patient was on L-thyroxine 50 mcg daily, TSH was 0.24 (0.35-4.50) and free T4 1.22 (normal 1.12). Prior  TSH had been 0.244 on 6/22. At discharge creatinine was 1.10 and GFR 52. Microcytic, hypochromic anemia was present with hemoglobin of 11 and hematocrit 32.3. According to her sitter the patient has been depressed. She lost one son in 2014 & had 2 sons die this year in June and  in October. The sitter validates exertional dyspnea. The patient's PCP has contacted me stating that palliative care is appropriate in view of her adult failure to thrive with multiple rehospitalizations.  Past medical and surgical history: History includes vascular dementia, A. fib, accelerated hypertension, and amyloid related CNS bleeds. Additionally she's had a thoracic aneurysm without rupture, renal calculi,colon malignancy, gout, GERD, diabetes, and chronic systolic heart failure She's had surgery for renal calculi, ERCP 2, cholecystectomy and hemicolectomy  Social history: Nondrinker, nonsmoker  Family history: Reviewed  Review of systems: Currently complaints of dizziness especially when she sits up. She seems to suggest she may have some vertigo with the dizziness. She describes being sleepy and weak again especially sitting up. She has shortness of breath when she is talking. History is questionable as she thought the date was 07/24/1987.  Constitutional: No fever,significant weight change, fatigue  Eyes: No redness, discharge, pain, vision change ENT/mouth: No nasal congestion,  purulent discharge, earache,change in hearing ,sore throat  Cardiovascular: No chest pain, palpitations,paroxysmal nocturnal dyspnea, claudication, edema  Respiratory: No cough, sputum production,hemoptysis, DOE , significant snoring,apnea  Gastrointestinal: No heartburn,dysphagia,abdominal pain, nausea / vomiting,rectal bleeding, melena,change in bowels Genitourinary: No dysuria,hematuria, pyuria,  incontinence, nocturia Musculoskeletal: No joint stiffness, joint swelling, weakness,pain Dermatologic: No rash, pruritus, change in  appearance of skin Neurologic: No dizziness,headache,syncope, seizures, numbness , tingling Psychiatric: No significant anxiety , depression, insomnia, anorexia Endocrine: No  change in hair/skin/ nails, excessive thirst, excessive hunger, excessive urination  Hematologic/lymphatic: No significant bruising, lymphadenopathy,abnormal bleeding Allergy/immunology: No itchy/ watery eyes, significant sneezing, urticaria, angioedema  Physical exam:  Pertinent or positive findings:She does appear lethargic. She has prominent lacrimal glands and edematous lids. The changes are most noted while in the right lower lid. Respirations are shallow. She has expiratory upper airway mild rhonchi. Heart rhythm is irregular/irregular. The right dorsalis pedis pulse is palpable intermittently. The others are decreased. She has weakness in the left lower extremity.  General appearance:Adequately nourished; no acute distress , increased work of breathing is present.   Lymphatic: No lymphadenopathy about the head, neck, axilla . Eyes: No conjunctival inflammation or lid edema is present. There is no scleral icterus. Ears:  External ear exam shows no significant lesions or deformities.   Nose:  External nasal examination shows no deformity or inflammation. Nasal mucosa are pink and moist without lesions ,exudates Oral exam: lips and gums are healthy appearing.There is no oropharyngeal erythema or exudate . Neck:  No thyromegaly, masses, tenderness noted.    Heart:  Normal rate and regular rhythm. S1 and S2 normal without gallop, murmur, click, rub .  Lungs:Chest clear to auscultation without wheezes, rhonchi,rales , rubs. Abdomen:Bowel sounds are normal. Abdomen is soft and nontender with no organomegaly, hernias,masses. GU: deferred  Extremities:  No cyanosis, clubbing,edema  Neurologic exam : Strength equal  in upper & lower extremities Balance,Rhomberg,finger to nose testing could not be completed due to clinical  state Deep tendon reflexes are equal Skin: Warm & dry w/o tenting. No significant lesions or rash.  See clinical summary under each active problem in the Problem List with associated updated therapeutic plan  Additional review of systems :Her significant other feels that the recent deaths of her two sons had has been a major component in her deterioration. He states "she's given up "according to the NP Student. Physical exam: Her eyelids are edematous without evidence of conjunctivitis or scleritis. Rhythm definitely was irregular,irregular, not normal. Her breath sounds were decreased although she had the upper airway sounds noted above. Strength was asymmetric in the lower extremities

## 2017-05-28 NOTE — Assessment & Plan Note (Signed)
Continue sertraline Psych consultation and follow-up at [facility]

## 2017-05-28 NOTE — Assessment & Plan Note (Signed)
Palliative care. °

## 2017-05-28 NOTE — Patient Instructions (Signed)
See assessment and plan under each diagnosis in the problem list and acutely for this visit 

## 2017-05-28 NOTE — Assessment & Plan Note (Signed)
Repeat blood pressures indicate adequate control on the present multiagent regimen

## 2017-05-28 NOTE — Assessment & Plan Note (Signed)
Dr. Mariea Clonts, her PCP has requested palliative care because of her adult failure to thrive and recurrent hospitalizations

## 2017-05-29 ENCOUNTER — Other Ambulatory Visit: Payer: Self-pay | Admitting: Licensed Clinical Social Worker

## 2017-05-29 NOTE — Patient Outreach (Addendum)
[facility]) Care Management  05/29/2017  Nicole [name] 05/12/1931 [id number]  Assessment- CSW received message from PCP. PCP has made request that palliative care be involved. PCP wishes for CSW to have discussion on end of life planning with patient. Patient is also DNR. CSW happy to complete this during next SNF visit.  Plan-CSW will follow up with SNF within one week.  Eula Fried, BSW, MSW, [email or web address] Phone: [phone] Fax: [phone]

## 2017-05-30 DIAGNOSIS — R531 Weakness: Secondary | ICD-10-CM | POA: Diagnosis not present

## 2017-06-03 LAB — BASIC METABOLIC PANEL
BUN: 14 (ref 4–21)
Creatinine: 1.1 (ref 0.5–1.1)
Glucose: 103
POTASSIUM: 3.8 (ref 3.4–5.3)
SODIUM: 143 (ref 137–147)

## 2017-06-05 ENCOUNTER — Other Ambulatory Visit: Payer: Self-pay | Admitting: Licensed Clinical Social Worker

## 2017-06-05 NOTE — Patient Outreach (Signed)
[place] [facility]) Care Management  [facility] Social Work  06/05/2017  Nicole [name] January 27, 1931 [id number]  Encounter Medications:  Outpatient Encounter Medications as of 06/05/2017  Medication Sig  . allopurinol (ZYLOPRIM) 100 MG tablet TAKE 1 TABLET BY MOUTH  DAILY  . donepezil (ARICEPT) 10 MG tablet Take one tablet by mouth once daily at bedtime  . feeding supplement, ENSURE ENLIVE, (ENSURE ENLIVE) LIQD Take 237 mLs 2 (two) times daily between meals by mouth.  . ferrous sulfate 325 (65 FE) MG EC tablet Take 1 tablet (325 mg total) by mouth 2 (two) times daily with a meal.  . furosemide (LASIX) 20 MG tablet Take 1 tablet (20 mg) by mouth every other day, may also take one additional tablet daily for weight gain greater than 3 lbs.  . isosorbide-hydrALAZINE (BIDIL) 20-37.5 MG tablet Take 2 tablets 3 (three) times daily by mouth.  . labetalol (NORMODYNE) 200 MG tablet Take 1 tablet (200 mg total) by mouth 2 (two) times daily.  Marland Kitchen levothyroxine (SYNTHROID, LEVOTHROID) 50 MCG tablet TAKE 1 TABLET BY MOUTH  DAILY BEFORE BREAKFAST  . potassium chloride (K-DUR) 10 MEQ tablet Take 1 tablet (10 mEq total) by mouth every other day.  . sertraline (ZOLOFT) 50 MG tablet Take 1 tablet (50 mg total) by mouth daily.   No facility-administered encounter medications on file as of 06/05/2017.     Functional Status:  In your present state of health, do you have any difficulty performing the following activities: 05/28/2017 05/23/2017  Hearing? N N  Vision? N N  Difficulty concentrating or making decisions? [name]  Walking or climbing stairs? Y Y  Dressing or bathing? Y Y  Doing errands, shopping? [name]  Some recent data might be hidden    Fall/Depression Screening:  PHQ 2/9 Scores 05/28/2017 11/08/2016 05/24/2016 01/20/2016 07/01/2015 06/10/2014 04/14/2013  PHQ - 2 Score 3 0 1 0 0 0 0  PHQ- 9 Score 7 - - - - - -    Assessment: CSW arrived at [facility] in order to complete visit and provide  social work assistance. Patient and patient's caregiver Rickey Kolette were present during visit. Caregiver states that she can tell patient is progressing well. Patient denies experiencing any pain or discomfort at this time. Patient was only receiving PT in her room but today started to go to the PT unit at SNF to complete her therapy. Patient also has started going to the cafeteria to eat dinner as well. Patient reports that she is feeling less weak and reports not being as sleepy as she was when she first arrived at [facility]. CSW completed brief review of community resources with caregiver and patient and touched base on grief resources as well. Patient was appreciative of visit.  CSW met with SNF social worker. She confirms that patient has a very strong support network of family and friends. She confirms that there is no discharge date set at this time.  [facility] CM Care Plan Problem One     Most Recent Value  Care Plan Problem One  SNF admission  Role Documenting the Problem One  Clinical Social Worker  Care Plan for Problem One  Active  [facility] Long Term Goal   Patient will have a safe and stable discharge back home within 90 days as evidenced by patient's reporting  [facility]N Long Term Goal Start Date  05/28/17  Interventions for Problem One Long Term Goal  SNF visit completed today. SNF social worker reports patient having a  very strong support network of family and friends. No discharge date has been set at this time but patient is progressing in PT.   [facility] CM Short Term Goal #1   Patient will attend all scheduled medical appointments within 30 days at SNF as evidenced by patient's reporting  [facility] CM Short Term Goal #1 Start Date  05/28/17  Interventions for Short Term Goal #1  Patient continues to attend all scheduled appointments including PT and OT  THN CM Short Term Goal #2   Patient will be able to read back 1 community resource within [facility] within 30 days  [facility] CM Short Term Goal #2 Start Date   05/28/17  Interventions for Short Term Goal #2  Brief community resource completed again during SNF visit with patient and her caregiver.     Plan: CSW will follow up with SNF within two weeks and continue to wait for discharge back home from SNF.  Eula Fried, BSW, MSW, [email or web address] Phone: [phone] Fax: [phone]

## 2017-06-06 ENCOUNTER — Ambulatory Visit: Payer: Medicare Other | Admitting: Internal Medicine

## 2017-06-10 DIAGNOSIS — R531 Weakness: Secondary | ICD-10-CM | POA: Diagnosis not present

## 2017-06-12 ENCOUNTER — Other Ambulatory Visit: Payer: Self-pay | Admitting: Licensed Clinical Social Worker

## 2017-06-12 NOTE — Patient Outreach (Signed)
[place] [facility]) Care Management  06/12/2017  Nicole [name] 12/23/1930 [id number]  Assessment- CSW completed call to Cameron Ali, SNF discharge planner at [facility]. CSW was unable to reach her successfully but left a message requesting a return call with any discharge updates. CSW will await for a return call.  Plan-CSW will await for a return call from SNF discharge planner or complete additional attempt within two weeks.  Eula Fried, BSW, MSW, [email or web address] Phone: [phone] Fax: [phone]

## 2017-06-16 IMAGING — CR DG CHEST 1V PORT
1 series · 1 of 1 positions shown · non-contrast
Comparison: 04/09/2015

CLINICAL DATA: Chest pain

EXAM:
PORTABLE CHEST - 1 VIEW

[AP]
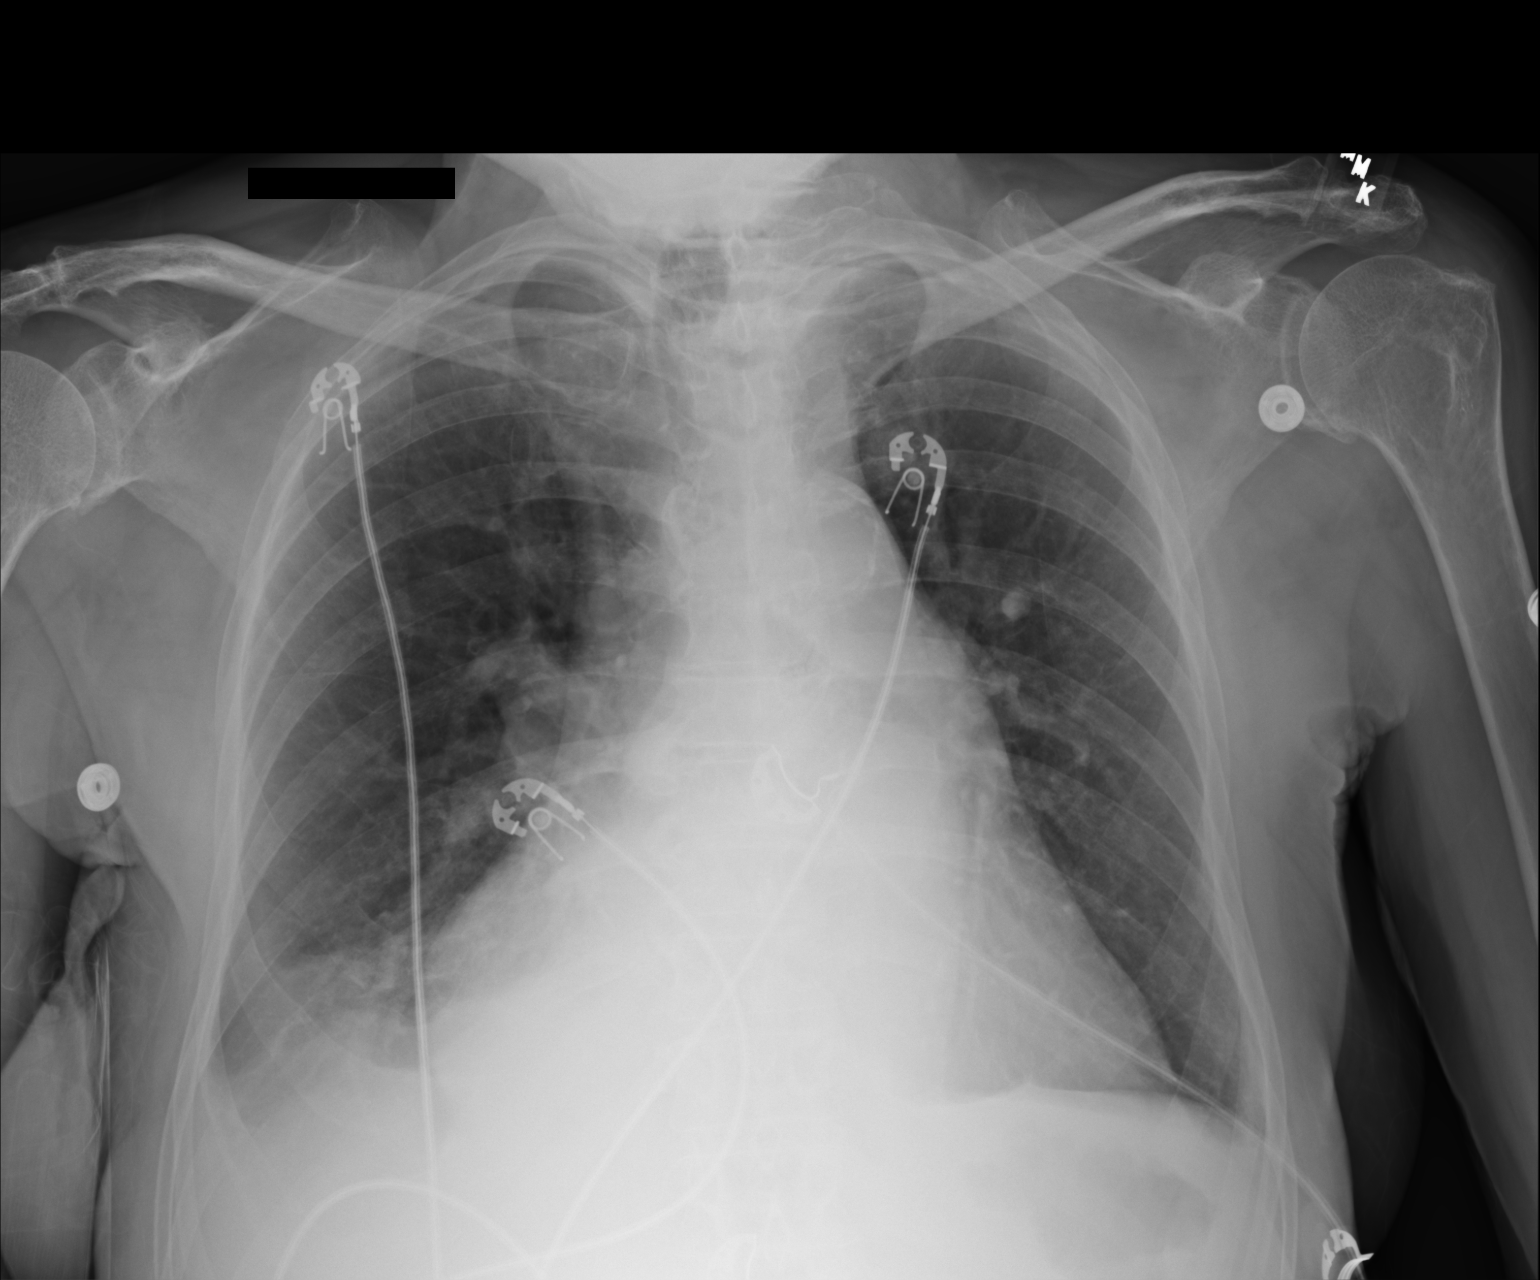

[1 of 1 positions shown; findings below may reference images not displayed]

FINDINGS: Cardiac shadow is mildly enlarged. Increased density is noted in the
right lung base consistent with early infiltrate with associated
small effusion. The bony structures are within normal limits. The
left lung is clear.
IMPRESSION: Mild right basilar infiltrate with associated effusion.

## 2017-06-17 IMAGING — CT CT CHEST W/O CM
2 of 4 series · 13 of 36 positions shown, 16 images · non-contrast
Comparison: Chest radiograph performed 07/27/2015

CLINICAL DATA: Acute onset of constipation and headache.
Generalized chest pain. Initial encounter.

EXAM:
CT CHEST WITHOUT CONTRAST
TECHNIQUE: Multidetector CT imaging of the chest was performed following the
standard protocol without IV contrast.

[Series 201: chest without, idose (2) · axial · non-contrast · 0.68mm/px · z∈[+114,+359]mm · 10 of 59 slices shown, 13 images]
[im 5/59  mediastinal]
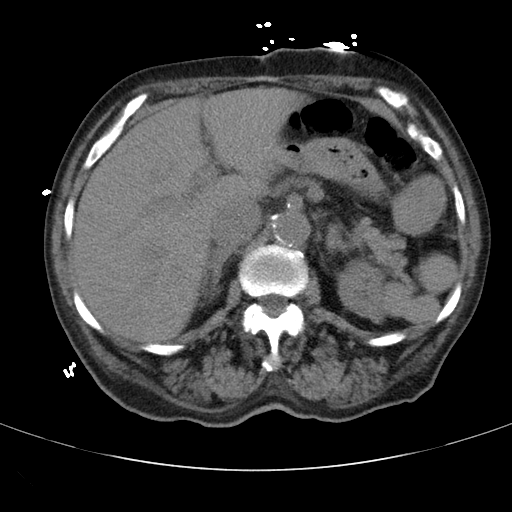
[im 5/59  lung]
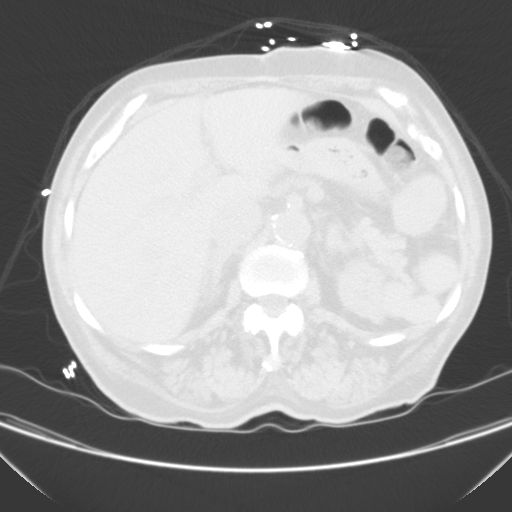
[im 9/59  lung]
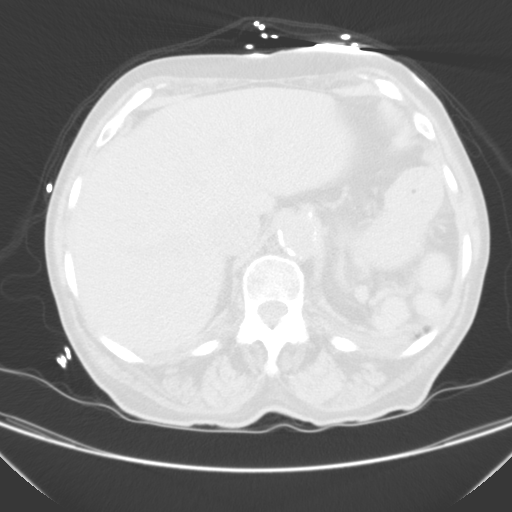
[im 17/59  lung]
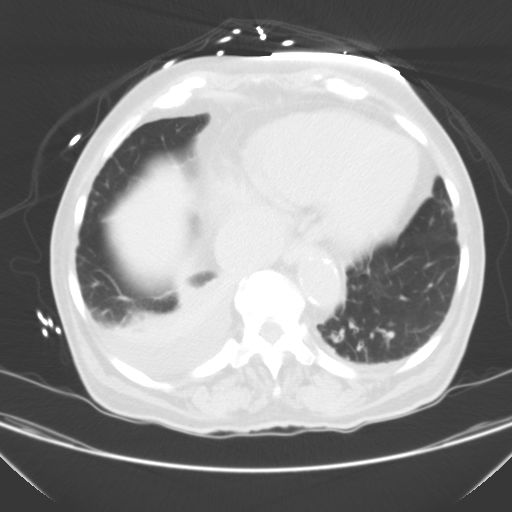
[im 21/59  lung]
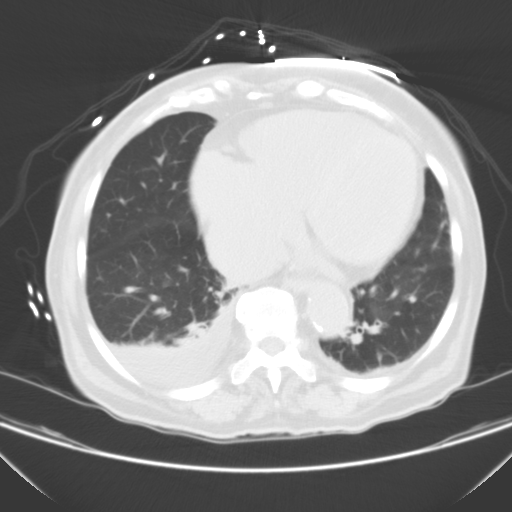
[im 25/59  mediastinal]
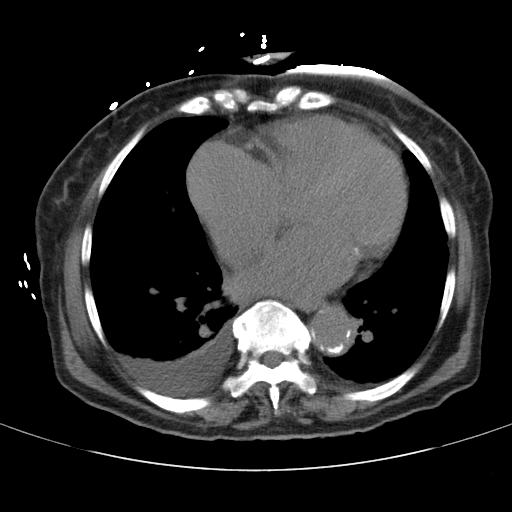
[im 25/59  lung]
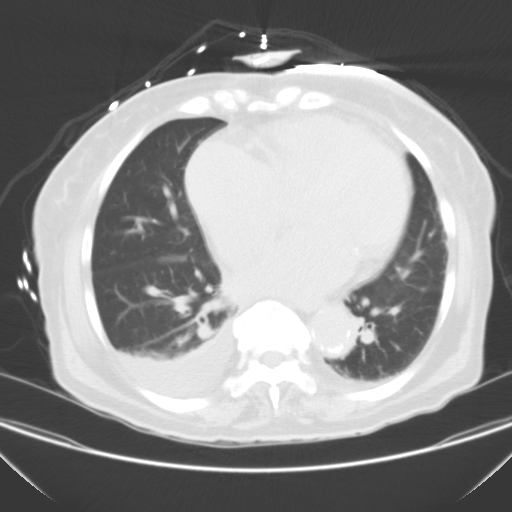
[im 34/59  lung]
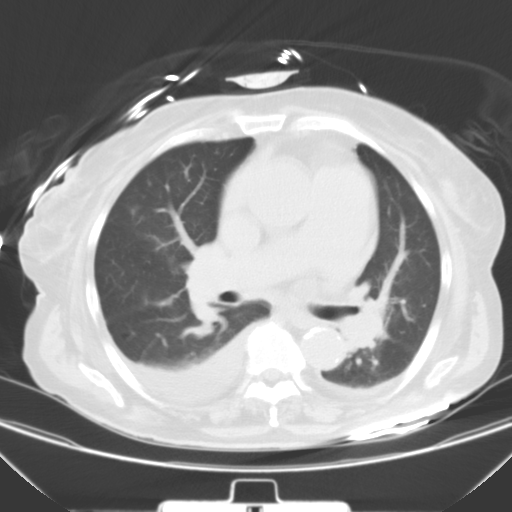
[im 38/59  lung]
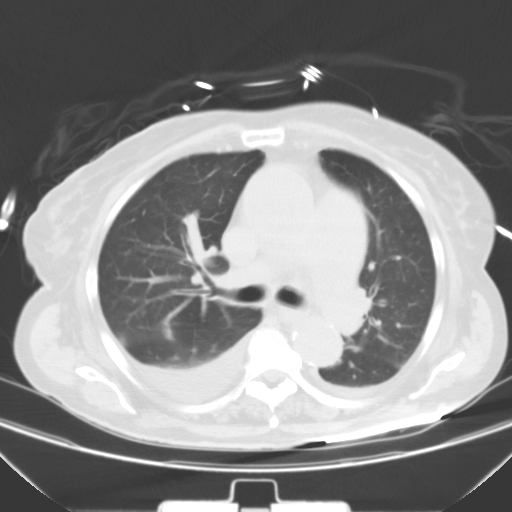
[im 42/59  lung]
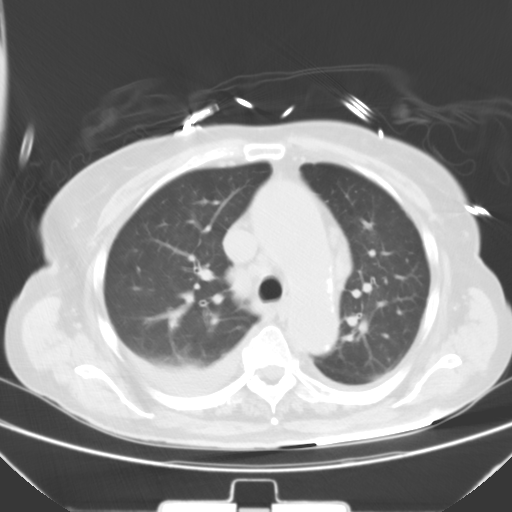
[im 50/59  mediastinal]
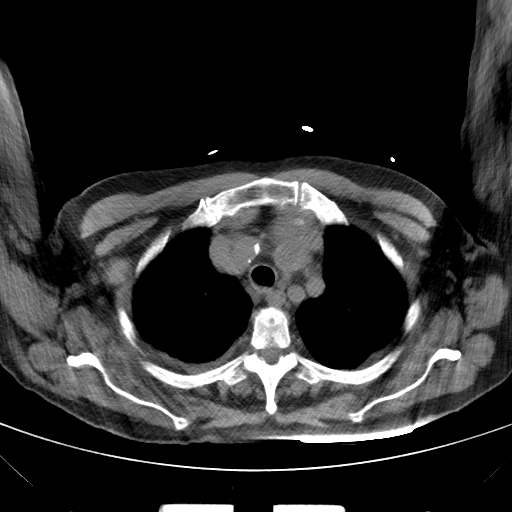
[im 50/59  lung]
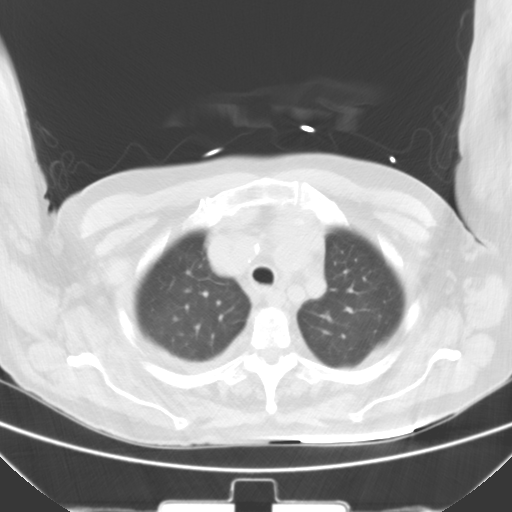
[im 54/59  lung]
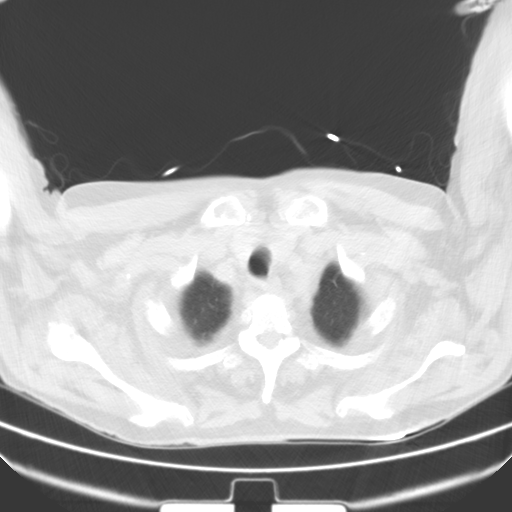

[Series 203: coronal, idose (2) · coronal · 0.45mm/px · 3 of 114 slices shown]
[im 23/114  lung]
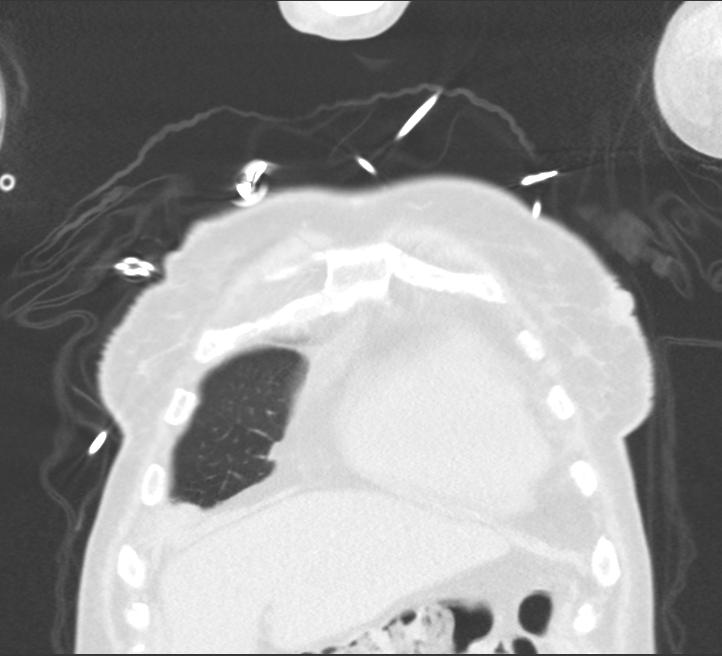
[im 46/114  lung]
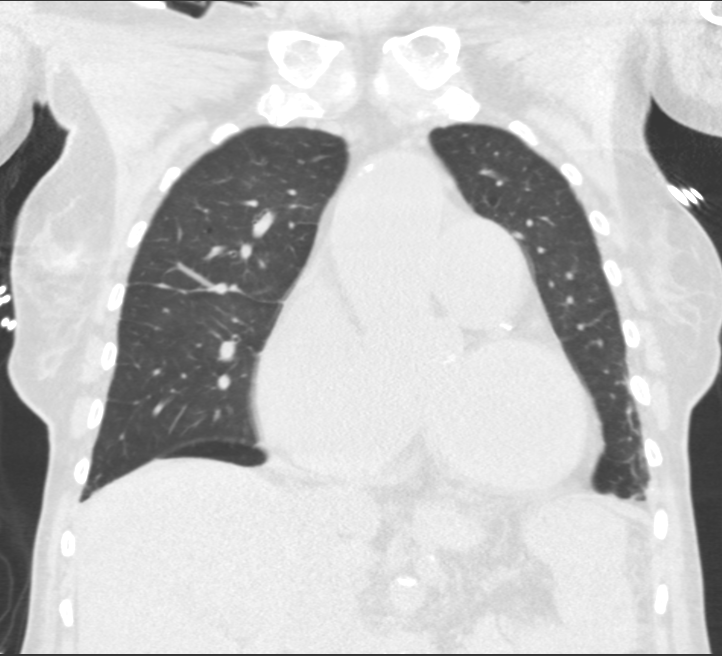
[im 68/114  lung]
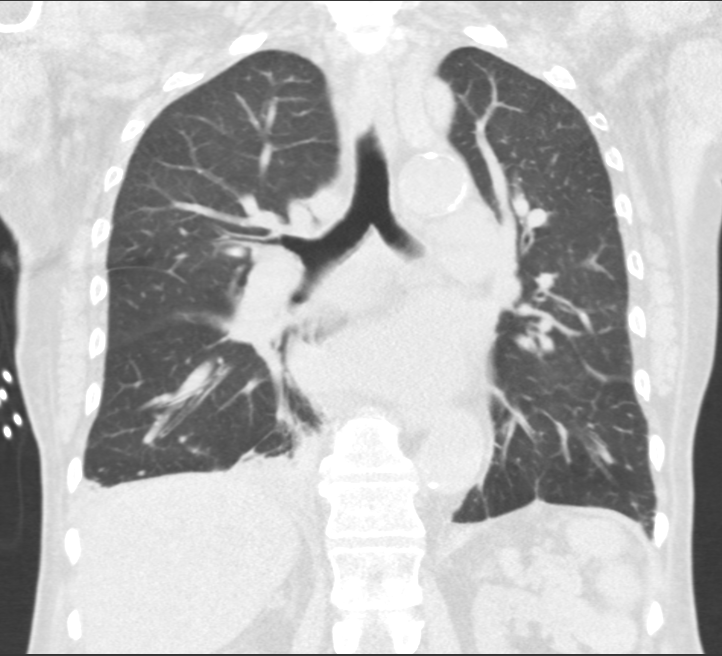

[13 of 36 positions shown; findings below may reference images not displayed]

FINDINGS: Small right and trace left pleural effusions are noted, with
associated atelectasis. No pneumothorax is seen. No masses are
identified.

Diffuse coronary artery calcifications are seen. Calcification is
noted along the thoracic aorta. There is aneurysmal dilatation of
the ascending thoracic aorta to 4.6 cm in AP dimension. The great
vessels are grossly unremarkable in appearance. No pericardial
effusion is identified. Visualized mediastinal nodes remain normal
in size.

The thyroid gland is unremarkable in appearance. No axillary
lymphadenopathy is seen.

The visualized portions of the liver are unremarkable. The patient
is status post splenectomy with splenosis noted at the left upper
quadrant. Scarring is noted at the upper pole of the left kidney.
The visualized portions of the pancreas are unremarkable.

No acute osseous abnormalities are seen.
IMPRESSION: 1. Small right and trace left pleural effusions, with associated
atelectasis.
2. Diffuse coronary artery calcifications seen.
3. Aneurysmal dilatation of the ascending thoracic aorta to 4.6 cm
in AP dimension. Ascending thoracic aortic aneurysm. Recommend
semi-annual imaging followup by CTA or MRA and referral to
cardiothoracic surgery if not already obtained. This recommendation
follows 8696 ACCF/AHA/AATS/ACR/ASA/SCA/ESCANDON/MAZON/MANOLACHE/FABIER Guidelines
for the Diagnosis and Management of Patients With Thoracic Aortic
Disease. Circulation. 8696; 121: [id number]
4. Scarring at the upper pole of the left kidney.

## 2017-06-17 IMAGING — CT CT HEAD W/O CM
2 series · 15 of 30 positions shown, 19 images · non-contrast
Comparison: CT of the head performed 06/24/2015

CLINICAL DATA: Acute onset of constipation and headache. Initial
encounter.

EXAM:
CT HEAD WITHOUT CONTRAST
TECHNIQUE: Contiguous axial images were obtained from the base of the skull
through the vertex without intravenous contrast.

[Series 201: head w/o, idose (1) · axial · non-contrast · 0.49mm/px · z∈[+918,+1038]mm · 13 of 30 slices shown, 17 images]
[im 3/30  brain]
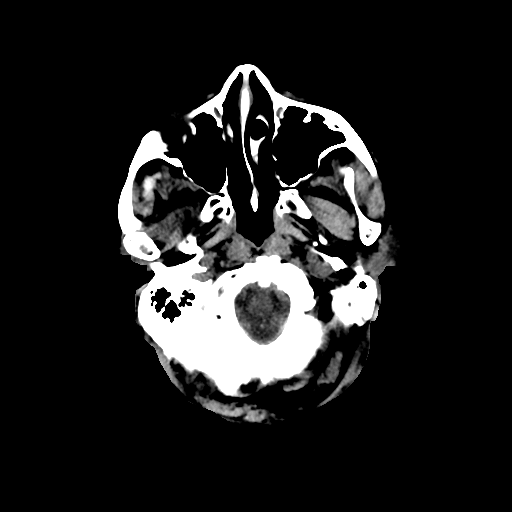
[im 3/30  bone]
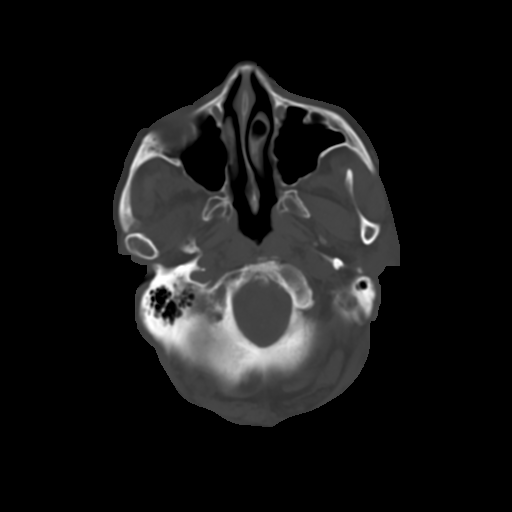
[im 5/30  brain]
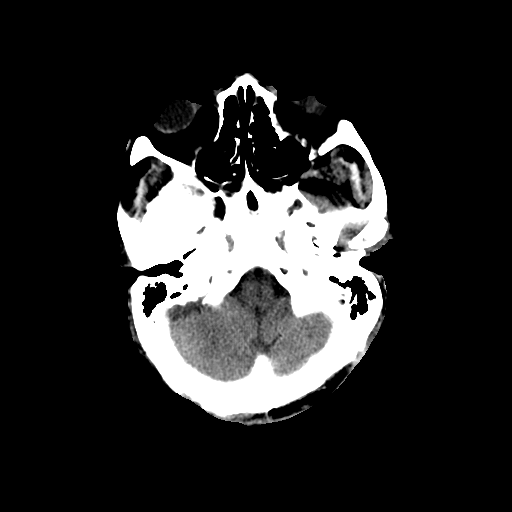
[im 7/30  brain]
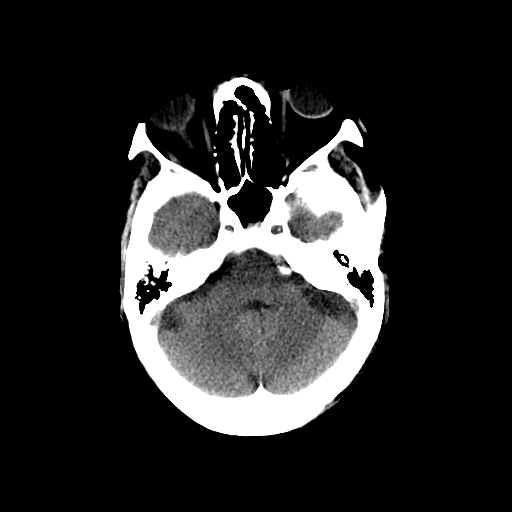
[im 9/30  brain]
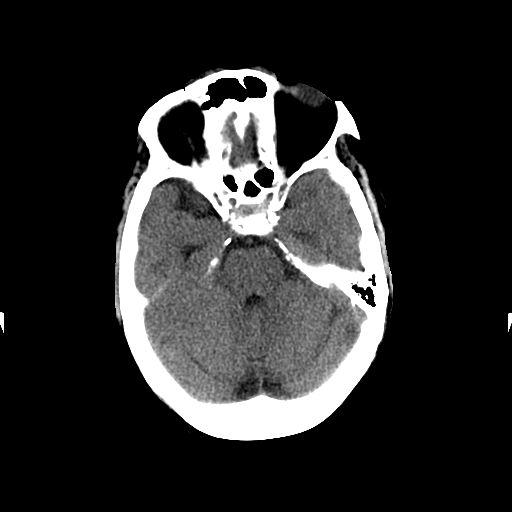
[im 11/30  brain]
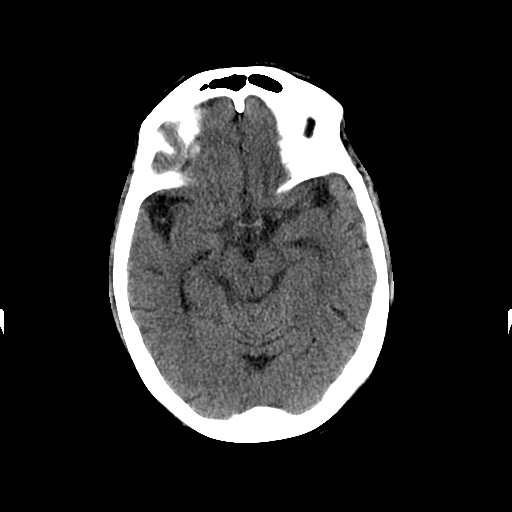
[im 11/30  bone]
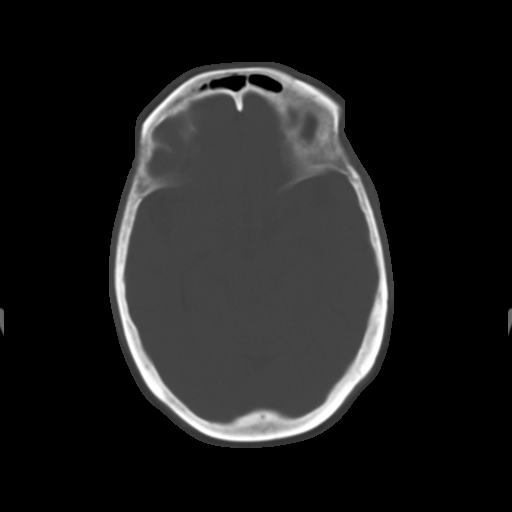
[im 13/30  brain]
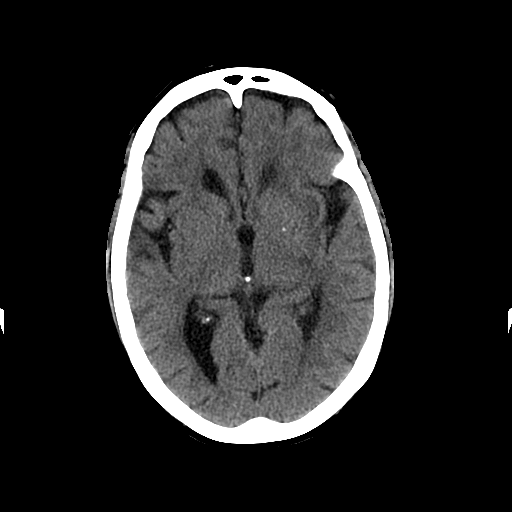
[im 15/30  brain]
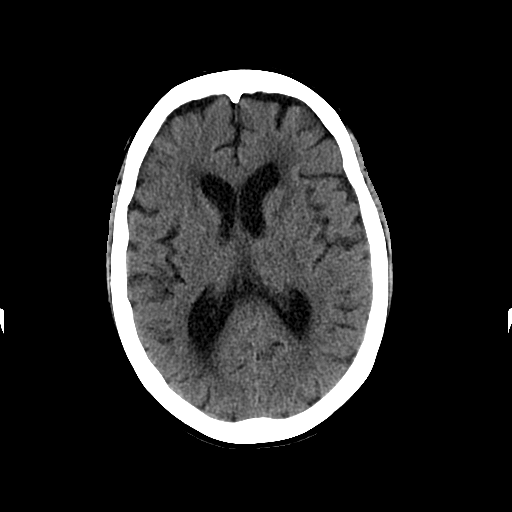
[im 17/30  brain]
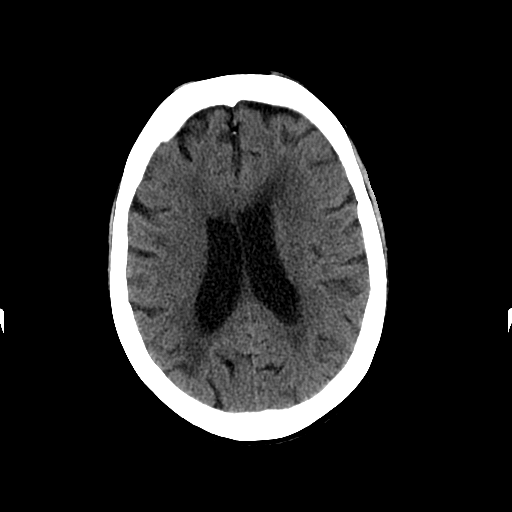
[im 19/30  brain]
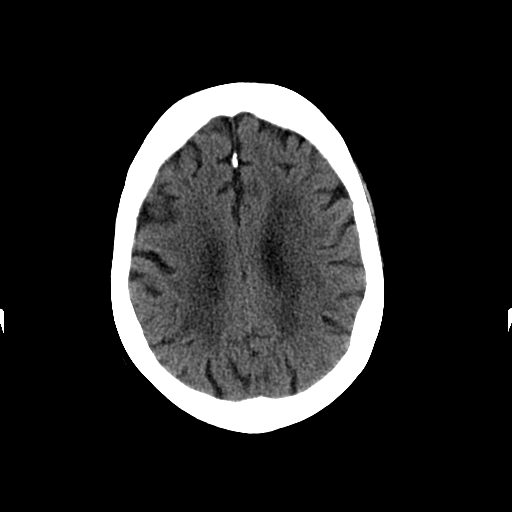
[im 19/30  bone]
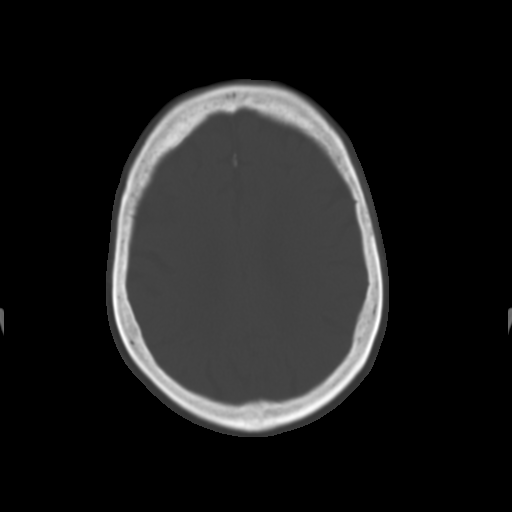
[im 21/30  brain]
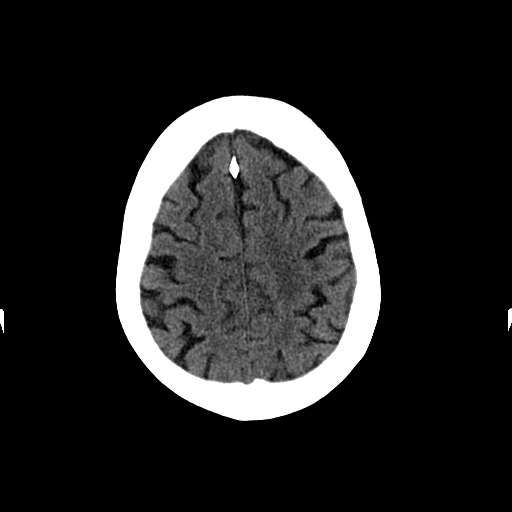
[im 23/30  brain]
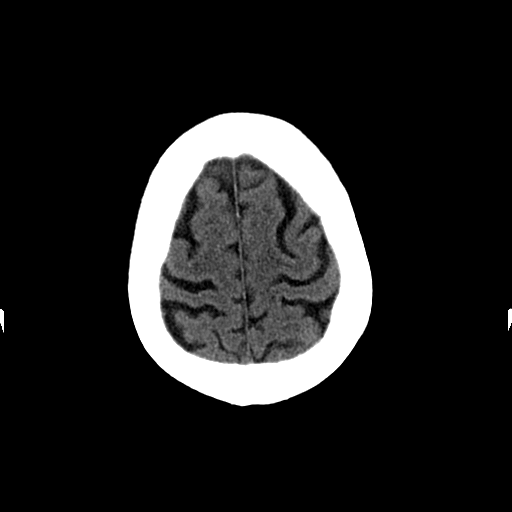
[im 25/30  brain]
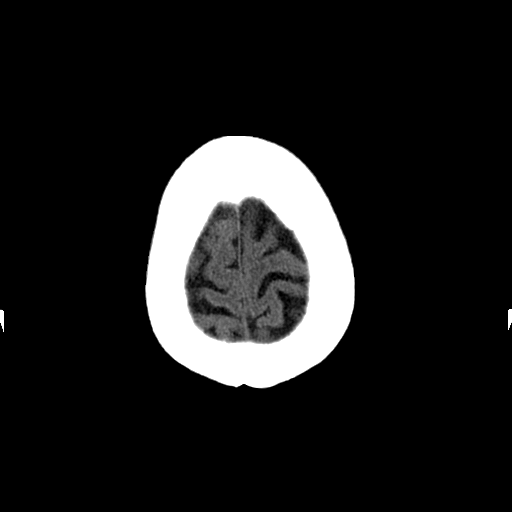
[im 27/30  brain]
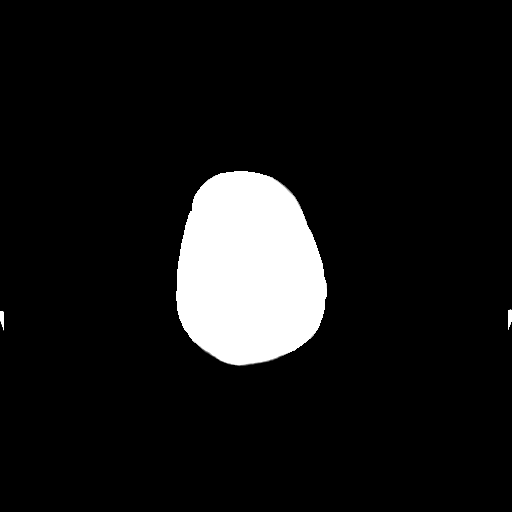
[im 27/30  bone]
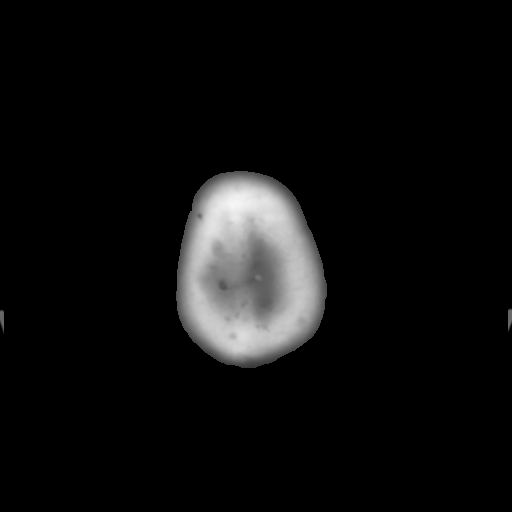

[Series 202: head w/o bone, idose (1) · axial · non-contrast · 0.49mm/px · z∈[+918,+938]mm · 2 of 30 slices shown]
[im 3/30  bone]
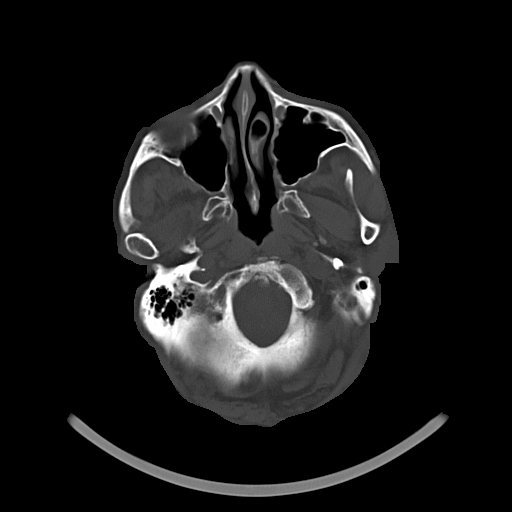
[im 7/30  bone]
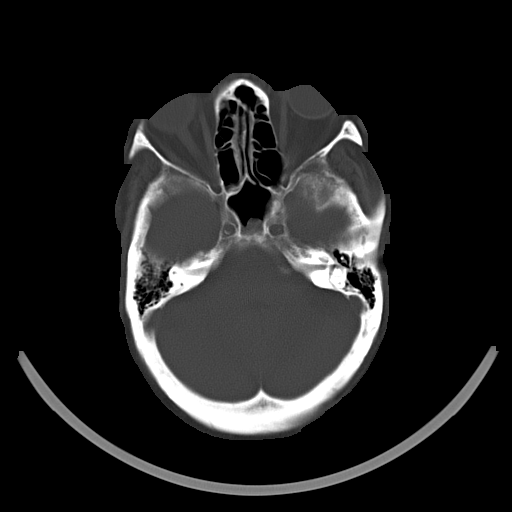

[15 of 30 positions shown; findings below may reference images not displayed]

FINDINGS: There is no evidence of acute infarction, mass lesion, or intra- or
extra-axial hemorrhage on CT.

Prominence of the ventricles and sulci reflects mild to moderate
cortical volume loss. Scattered periventricular and subcortical
white matter change likely reflects small vessel ischemic
microangiopathy.

The brainstem and fourth ventricle are within normal limits. The
basal ganglia are unremarkable in appearance. The cerebral
hemispheres demonstrate grossly normal gray-white differentiation.
No mass effect or midline shift is seen.

There is no evidence of fracture; visualized osseous structures are
unremarkable in appearance. The orbits are within normal limits. The
paranasal sinuses and mastoid air cells are well-aerated. No
significant soft tissue abnormalities are seen.
IMPRESSION: 1. No acute intracranial pathology seen on CT.
2. Mild to moderate cortical volume loss and scattered small vessel
ischemic microangiopathy.

## 2017-06-18 ENCOUNTER — Non-Acute Institutional Stay (SKILLED_NURSING_FACILITY): Payer: Medicare Other | Admitting: Internal Medicine

## 2017-06-18 ENCOUNTER — Encounter: Payer: Self-pay | Admitting: Internal Medicine

## 2017-06-18 DIAGNOSIS — I5023 Acute on chronic systolic (congestive) heart failure: Secondary | ICD-10-CM | POA: Diagnosis not present

## 2017-06-18 DIAGNOSIS — I10 Essential (primary) hypertension: Secondary | ICD-10-CM

## 2017-06-18 NOTE — Progress Notes (Signed)
NURSING HOME LOCATION:  [facility] ROOM NUMBER:  [id number]  CODE STATUS:  DNR  PCP:   [name], DO  [place] [address]  This is a nursing facility follow up for specific acute issue of dyspnea.  Interim medical record and care since last [facility] visit was updated with review of diagnostic studies and change in clinical status since last visit were documented.  HPI: [facility] saw the patient yesterday for dyspnea. Clinically there was concern for heart failure and she was given 40 mg of Lasix. She had been on 20 mg of Lasix daily with an extra dose of 20 should she gain 3 pounds. The doubled Lasix dose has resulted in a 6 pound weight loss. The portable chest x-ray differential diagnosis included pneumonia, effusions, and heart failure. Because of the possibility of HCAP, the [facility] NP ordered 1 dose of Levaquin.The films were personally reviewed with the  [facility] NP. Lateral film is so rotated that is not interpretable. The PA film is also rotated but there is demonstration of cephalization of flow greater in the right upper lobe than the left. Cardiomegaly is present. She has diffuse effusion or infiltrate greater in the right lower lobe greater than the left. There is a tissue artifact obliquely over the right lower lobe.CHF rather than HCAP is suggested. The patient does have a history of acute and chronic diastolic and systolic heart failure in the setting of accelerated hypertension.. Her ejection fraction has been as low as 30%. The last echo on record 2011 suggested an ejection fraction of 45-50 percent. She also has chronic atrial fibrillation.  Review of systems: Dementia invalidated responses. Date given as "December 3, 19 200". She does state she has occasional paroxysmal nocturnal dyspnea. She also validates right lower extremity edema. Also has had occasional tachycardia.  Constitutional: No fever,significant weight change, fatigue  Eyes: No  redness, discharge, pain, vision change ENT/mouth: No nasal congestion,  purulent discharge, earache,change in hearing ,sore throat  Cardiovascular: No chest pain Respiratory: No cough, sputum production,hemoptysis,  significant snoring,apnea    Physical exam:  Pertinent or positive findings: The lacrimal glands are prominent. The lower lids are puffy. Pupils are pinpoint. She has a lower partial and upper plate. Breath sounds are decreased at the bases. Her heart rhythm is slow and irregular. She has 1/2+ edema right lower extremity and 1+ of the left lower extremity. Homans sign is negative. 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 Contractures of isolated fingers present as well.  General appearance:Adequately nourished; no acute distress , increased work of breathing is present.   Lymphatic: No lymphadenopathy about the head, neck, axilla . Eyes: No conjunctival inflammation or lid edema is present. There is no scleral icterus. Ears:  External ear exam shows no significant lesions or deformities.   Nose:  External nasal examination shows no deformity or inflammation. Nasal mucosa are pink and moist without lesions ,exudates Oral exam: lips and gums are healthy appearing.There is no oropharyngeal erythema or exudate . Neck:  No thyromegaly, masses, tenderness noted.    Heart:  No gallop, murmur, click, rub .  Lungs: without wheezes, rhonchi,rales , rubs. Abdomen:Bowel sounds are normal. Abdomen is soft and nontender with no organomegaly, hernias,masses. GU: deferred  Extremities:  No cyanosis, clubbing Skin: Warm & dry w/o tenting. No significant lesions or rash.  See summary under each active problem in the Problem List with associated updated therapeutic plan

## 2017-06-18 NOTE — Assessment & Plan Note (Signed)
Change to 40 mg of Lasix daily and monitor weights Sodium restriction discussed, but the patient has dementia

## 2017-06-18 NOTE — Patient Instructions (Signed)
See assessment and plan under each diagnosis in the problem list and acutely for this visit 

## 2017-06-18 NOTE — Assessment & Plan Note (Addendum)
BP controlled; no change in antihypertensive medications at this time. If bradycardia is a significant issue, the Normodyne could be changed to carvedilol with titration of dose as clinically indicated

## 2017-06-19 ENCOUNTER — Other Ambulatory Visit: Payer: Self-pay | Admitting: Licensed Clinical Social Worker

## 2017-06-19 NOTE — Patient Outreach (Signed)
[facility]) Care Management  [facility] Social Work  06/19/2017  Nicole [name] 1931-03-13 [id number]  Encounter Medications:  Outpatient Encounter Medications as of 06/19/2017  Medication Sig  . allopurinol (ZYLOPRIM) 100 MG tablet TAKE 1 TABLET BY MOUTH  DAILY  . donepezil (ARICEPT) 10 MG tablet Take one tablet by mouth once daily at bedtime  . ferrous sulfate 325 (65 FE) MG EC tablet Take 1 tablet (325 mg total) by mouth 2 (two) times daily with a meal.  . furosemide (LASIX) 20 MG tablet Take 20 mg by mouth daily. May give another 20 mg tablet prn for weight gain greater than 3 pounds  . hydrALAZINE (APRESOLINE) 25 MG tablet Take 75 mg by mouth 3 (three) times daily. Take 3 tablets to = 75 mg TID  . ipratropium-albuterol (DUONEB) 0.5-2.5 (3) MG/3ML SOLN Take 3 mLs by nebulization every 6 (six) hours as needed (SOB/wheezing).  . isosorbide mononitrate (IMDUR) 30 MG 24 hr tablet Take 30 mg by mouth 2 (two) times daily.  Marland Kitchen labetalol (NORMODYNE) 200 MG tablet Take 1 tablet (200 mg total) by mouth 2 (two) times daily.  Marland Kitchen levothyroxine (SYNTHROID, LEVOTHROID) 50 MCG tablet TAKE 1 TABLET BY MOUTH  DAILY BEFORE BREAKFAST  . NUTRITIONAL SUPPLEMENT LIQD Take 120 mLs by mouth 2 (two) times daily.  . potassium chloride (K-DUR) 10 MEQ tablet Take 1 tablet (10 mEq total) by mouth every other day.  . sertraline (ZOLOFT) 50 MG tablet Take 1 tablet (50 mg total) by mouth daily.   No facility-administered encounter medications on file as of 06/19/2017.     Functional Status:  In your present state of health, do you have any difficulty performing the following activities: 05/28/2017 05/23/2017  Hearing? N N  Vision? N N  Difficulty concentrating or making decisions? [name]  Walking or climbing stairs? Y Y  Dressing or bathing? Y Y  Doing errands, shopping? [name]  Some recent data might be hidden    Fall/Depression Screening:  PHQ 2/9 Scores 05/28/2017 11/08/2016 05/24/2016 01/20/2016  07/01/2015 06/10/2014 04/14/2013  PHQ - 2 Score 3 0 1 0 0 0 0  PHQ- 9 Score 7 - - - - - -    Assessment: CSW arrived at [facility] in order to complete visit. Patient's aide was present during visit as well. Patient's aide reports that patient's god daughter and friend [name] would like to speak with me and discuss patient's plan for discharge. CSW completed call to [name] but was unable to reach her. CSW left a HIPPA compliant voice message encouraging a return call once available. CSW was informed that patient will be discharging back home on 06/21/17. Patient has a strong support network and family feel comfortable with patient returning home but feel as if she needs to stay a little longer but insurance is not able to pay per patient's aide. CSW was unable to meet with SNF social worker as she was not available during that time. CSW completed a call to SNF social worker and left a message requesting a return call with discharge plan information. Patient reports feeling well today. Patient reported that she PT was very impressed with her today. Patient is eager to return home. Patient states that she has some questions about how she will receive her oxygen once she returns home which she hopes will be answered soon by SNF staff. Patient reports that she was able to do her own bath today but noticed she was getting very weak  afterwards and requested oxygen. Patient is agreeable to both [facility] RNCM and Pharmacy referral once she discharges back home to prevent readmission.   Plan: CSW will await for return calls from patient's family and SNF social worker and will make referrals for [facility] RNCM and Pharmacy post discharge.  Eula Fried, BSW, MSW, [email or web address] Phone: [phone] Fax: [phone]

## 2017-06-20 ENCOUNTER — Other Ambulatory Visit: Payer: Self-pay | Admitting: Licensed Clinical Social Worker

## 2017-06-20 NOTE — Progress Notes (Signed)
Message sent to scheduling 

## 2017-06-20 NOTE — Patient Outreach (Signed)
[facility]) Care Management  06/20/2017  TIEISHA DARDEN [birth year] [id number]  Assessment- CSW received a return call from SNF social worker and was informed that no discharge date has been scheduled at this time as insurance has not cut off her days yet. CSW received incoming call from patient's god daughter [name]. HIPPA verifications provided. [name] reports that they are wanting to take patient home once insurance states that are no longer willing to pay for days at [facility]. Cecil Cranker shares that she will contact SNF social worker to discuss this and discharge plans. At this time, there is NO discharge date set. [name] denies any further social work needs but desires that [facility] referral to both [place] and Pharmacy once patient discharges back home. [name] shares that patient has been struggling with grief over the sudden losses of her children but that family have grief resources and will follow up with those as needed once she returns home.   Plan-CSW will await discharge updates and then place requested referrals.  Eula Fried, BSW, MSW, [email or web address] Phone: [phone] Fax: [phone]

## 2017-06-24 ENCOUNTER — Other Ambulatory Visit: Payer: Self-pay | Admitting: Licensed Clinical Social Worker

## 2017-06-24 ENCOUNTER — Telehealth: Payer: Self-pay | Admitting: Internal Medicine

## 2017-06-24 NOTE — Telephone Encounter (Signed)
Pt of Dr. Debara Pickett Current resident at [facility] call. Pt daughter needing patient added to see Dr. Debara Pickett next available - states no acute concerns. Explains pt did have some recent swelling but that she is getting "shots" to manage this at facility.  She wanted to have pt return to see Dr. Debara Pickett but "didn't want to wait til January". I gave options of PA f/u sooner if needed. She states preference to schedule w Dr. Debara Pickett only.  Routed to scheduling to address - I am having technical issues accessing Dr. Lysbeth Penner calendar.

## 2017-06-24 NOTE — Patient Outreach (Signed)
[facility]) Care Management  06/24/2017  Nicole [name] 1930/12/11 [id number]  Assessment- CSW received incoming call from patient's goddaughter and family friend Nicole [name]. Nicole [name] had some general questions in regards to SNF discharge that CSW was able to answer for her. Nicole [name] is wanting to make sure that patient is not responsible for paying for any days at [facility]. CSW informed her that last she heard from SNF social worker that insurance is still paying for her stay at SNF and they will immediately let SNF facility know when they are cutting off her days and then family will be notified of this update as well. Nicole [name] was appreciative of information provided and is agreeable to contact other staff at SNF in order to get clarification on certain questions in regards to medical equipment. Family friend reminded that [place] will be out of the office from 06/27/17-06/28/17.   Plan-CSW will follow up within one week and continue to assist with a safe discharge back home.   Nicole [name], BSW, MSW, [name] Phone: [phone] Fax: [phone]

## 2017-06-24 NOTE — Telephone Encounter (Signed)
Pt c/o swelling: STAT is pt has developed SOB within 24 hours  1) How much weight have you gained and in what time span?  Unknown   2) If swelling, where is the swelling located? Legs and feet  Are you currently taking a fluid pill? Yes  Are you currently SOB? Sob  3) Do you have a log of your daily weights (if so, list)? Unknown   4) Have you gained 3 pounds in a day or 5 pounds in a week? Unknown   5) Have you traveled recently? No, pt is in a facility currently at  [facility]

## 2017-06-25 NOTE — Telephone Encounter (Signed)
Follow up  [facility] said that they can not transport pt to the appt please call and see what other options are available

## 2017-06-25 NOTE — Telephone Encounter (Signed)
Returned call to [place]. They will NOT have transportation for 12/5 appt. No other APP appointments this week. Scheduled to see MD in "acute" visit slot on 07/01/17.

## 2017-06-25 NOTE — Telephone Encounter (Signed)
Received communication from Dr. Armando Reichert. Nicole [name] regarding symptoms noted below. Dr. Linna Darner requests a sooner appointment for recurrent CHF concerns requiring diuretic dose adjustments. MD is [facility] this week, scheduled PAOV 12/5 @ 1030am with [place], [place]. Called [place] and notified nursing staff who will confirm that transportation can be arranged for this visit. They will call back

## 2017-06-26 ENCOUNTER — Ambulatory Visit: Payer: Medicare Other | Admitting: Cardiology

## 2017-06-26 ENCOUNTER — Other Ambulatory Visit: Payer: Self-pay | Admitting: Licensed Clinical Social Worker

## 2017-06-26 ENCOUNTER — Ambulatory Visit (HOSPITAL_COMMUNITY)
Admission: RE | Admit: 2017-06-26 | Discharge: 2017-06-26 | Disposition: A | Discharge status: Home / Self Care | Payer: Medicare Other | Source: Ambulatory Visit | Attending: Cardiovascular Disease | Admitting: Cardiovascular Disease

## 2017-06-26 ENCOUNTER — Encounter: Payer: Self-pay | Admitting: Physician Assistant

## 2017-06-26 ENCOUNTER — Ambulatory Visit (INDEPENDENT_AMBULATORY_CARE_PROVIDER_SITE_OTHER): Payer: Medicare Other | Admitting: Physician Assistant

## 2017-06-26 VITALS — BP 162/90 | HR 106 | Ht 68.0 in | Wt 152.0 lb

## 2017-06-26 DIAGNOSIS — M7989 Other specified soft tissue disorders: Secondary | ICD-10-CM

## 2017-06-26 DIAGNOSIS — I712 Thoracic aortic aneurysm, without rupture, unspecified: Secondary | ICD-10-CM

## 2017-06-26 DIAGNOSIS — I4821 Permanent atrial fibrillation: Secondary | ICD-10-CM

## 2017-06-26 DIAGNOSIS — E119 Type 2 diabetes mellitus without complications: Secondary | ICD-10-CM

## 2017-06-26 DIAGNOSIS — E039 Hypothyroidism, unspecified: Secondary | ICD-10-CM | POA: Diagnosis not present

## 2017-06-26 DIAGNOSIS — R011 Cardiac murmur, unspecified: Secondary | ICD-10-CM | POA: Diagnosis not present

## 2017-06-26 DIAGNOSIS — I5032 Chronic diastolic (congestive) heart failure: Secondary | ICD-10-CM | POA: Diagnosis not present

## 2017-06-26 DIAGNOSIS — E785 Hyperlipidemia, unspecified: Secondary | ICD-10-CM | POA: Diagnosis not present

## 2017-06-26 DIAGNOSIS — Z79899 Other long term (current) drug therapy: Secondary | ICD-10-CM

## 2017-06-26 DIAGNOSIS — I482 Chronic atrial fibrillation: Secondary | ICD-10-CM

## 2017-06-26 DIAGNOSIS — J90 Pleural effusion, not elsewhere classified: Secondary | ICD-10-CM

## 2017-06-26 MED ORDER — LABETALOL HCL 300 MG PO TABS: 300.0000 mg | ORAL_TABLET | Freq: Two times a day (BID) | ORAL | 11 refills | Status: DC

## 2017-06-26 NOTE — Progress Notes (Signed)
Cardiology Office Note    Date:  06/27/2017   ID:  Nicole, [name] 1931-05-20, MRN [id number]  PCP:  [name], DO  Cardiologist:  Dr. Debara Pickett  Chief Complaint  Patient presents with  . Follow-up    seen for Dr. Debara Pickett.     History of Present Illness:  Nicole [name] is a 83 y.o. female with PMH of chronic atrial fibrillation, chronic diastolic HF, moderate MR, CKD, DM II, HLD, hypothyroidism, CVA, thoracic aortic aneurysm.  She was previously followed by Dr. Rex Kras.  She had a history of congestive heart failure with EF 45% and moderate MR.  She also has thoracic aortic aneurysm.  Last CT angiogram of the chest obtained on 01/11/2017 showed moderate right pleural effusion, 4.8 cm ascending thoracic aortic aneurysm, recommend semiannually image follow-up.  However given her overall condition, she is not a surgical candidate.  She has tried Eliquis in the past, however due to epistaxis, this was switched to Coumadin.  She unfortunately had a fall and resulting intracranial bleeding.  She was taken off of Coumadin and considered not a candidate for anticoagulation anymore.  There was some consideration in the past regarding Watchman device, however it was felt she is very unlikely to tolerate 45-month of anticoagulation.  Last echocardiogram obtained on 08/23/2016 showed EF 55-60%, mild AI, mild MR, severe LAE, PA peak pressure of 50 mmHg.  Patient presents to the ED on 05/21/2017, she was noted to be in atrial fibrillation with RVR and hypertensive with systolic blood pressure in the 200 range.  It appears patient stopped taking her labetalol.  She was also noted to be hypokalemic as well.  She was restarted on her home dose of labetalol and discharged.  She returned the following day complaining of weakness.  She had significant weight loss in 2 weeks after her son died.  She was admitted for acute metabolic encephalopathy, urinalysis showed many bacteria, negative nitrite. She was treated  for UTI. Brain MRI showed no acute reversible finding.  Creatinine was elevated, she was hydrated with IV fluids.  Urine culture showed greater than 100,000 lactobacillus species, likely contaminant.  She was placed on ceftriaxone and completed the course in the hospital.  She was started on BiDil during this admission.  She currently resides in [place] for recovery.  She presents today for cardiology office visit.  She has diminished breath sounds in the right concerning for pleural effusion.  I recommended a chest x-ray.  Her heart rate was only borderline controlled.  She remains fairly weak.  She also has a significant left lower extremity swelling, although right leg has no swelling at all.  We will obtain a venous Doppler of the left lower extremity.  On physical exam, she has a fairly loud heart murmur.  It is unclear to me if her fast heart rate is making the murmur louder.  I recommended increasing labetalol to 300 mg twice daily for better rate control.  She will need a basic metabolic panel today to check the potassium level. If heart murmur remain loud on followup, may consider repeat echo depend on how aggressive the patient or family want to approach the issue. There is some discussion of palliative care.     Past Medical History:  Diagnosis Date  . AAA (abdominal aortic aneurysm) ([place])   . Anemia   . Anxiety   . Arthritis   . Atrial fibrillation ([place])   . Cervicalgia   . Cholelithiasis   .  Chronic systolic heart failure ([place])   . CKD (chronic kidney disease)   . Colon cancer ([place]) 07/1997  . Depression   . Diabetes mellitus ([place])   . Dizziness and giddiness   . Electrolyte and fluid disorders not elsewhere classified   . GERD (gastroesophageal reflux disease)   . Gout, unspecified   . Hemorrhage of rectum and anus   . Hypercholesteremia   . Hypertension   . Hypoglycemia, unspecified   . Hypopotassemia   . Hypothyroidism   . Kidney stone    renal calculi  . Other  chronic allergic conjunctivitis   . Other specified cardiac dysrhythmias(427.89)   . Pain in joint, lower leg   . Perforation of bile duct   . Proteinuria   . Shortness of breath   . Small bowel obstruction due to adhesions ([place]) 05/16/2012  . Stroke ([place])   . Swelling, mass, or lump in head and neck   . Thoracic aneurysm without mention of rupture    4.6 cm Asc Aortic aneurysm, CT 07/2015    Past Surgical History:  Procedure Laterality Date  . CARDIAC CATHETERIZATION  2008   moderate severe pulm HTN; with elevted pulm capillary wedge pressure   . CHOLECYSTECTOMY  05/14/2012   Procedure: LAPAROSCOPIC CHOLECYSTECTOMY WITH INTRAOPERATIVE CHOLANGIOGRAM;  Surgeon: Haywood Lasso, MD;  Location: [facility];  Service: General;  Laterality: N/A;  . ERCP  05/17/2012   Procedure: ENDOSCOPIC RETROGRADE CHOLANGIOPANCREATOGRAPHY (ERCP);  Surgeon: Missy Sabins, MD;  Location: [facility];  Service: Gastroenterology;  Laterality: N/A;  . ERCP  08/12/2012   Procedure: ENDOSCOPIC RETROGRADE CHOLANGIOPANCREATOGRAPHY (ERCP);  Surgeon: Missy Sabins, MD;  Location: [facility] ENDOSCOPY;  Service: Endoscopy;  Laterality: N/A;  . HEMICOLECTOMY  08/11/1997  . KIDNEY STONE SURGERY    . TRANSTHORACIC ECHOCARDIOGRAM  11/2009   EF 45-50%, mild-mod AV regurg; mild MR; mod TR; ascending aorta mildly dilated    Current Medications: Outpatient Medications Prior to Visit  Medication Sig Dispense Refill  . allopurinol (ZYLOPRIM) 100 MG tablet TAKE 1 TABLET BY MOUTH  DAILY 90 tablet 1  . donepezil (ARICEPT) 10 MG tablet Take one tablet by mouth once daily at bedtime 90 tablet 3  . ferrous sulfate 325 (65 FE) MG EC tablet Take 1 tablet (325 mg total) by mouth 2 (two) times daily with a meal. 60 tablet 3  . furosemide (LASIX) 20 MG tablet Take 20 mg by mouth 2 (two) times daily. May give another 20 mg tablet prn for weight gain greater than 3 pounds    . hydrALAZINE (APRESOLINE) 25 MG tablet Take 75 mg by mouth 3 (three) times  daily. Take 3 tablets to = 75 mg TID    . ipratropium-albuterol (DUONEB) 0.5-2.5 (3) MG/3ML SOLN Take 3 mLs by nebulization every 6 (six) hours as needed (SOB/wheezing).    . isosorbide mononitrate (IMDUR) 30 MG 24 hr tablet Take 30 mg by mouth 2 (two) times daily.    Marland Kitchen levothyroxine (SYNTHROID, LEVOTHROID) 25 MCG tablet Take 25 mcg by mouth daily before breakfast.    . NUTRITIONAL SUPPLEMENT LIQD Take 120 mLs by mouth 2 (two) times daily.    . OXYGEN Inhale 2 L into the lungs as needed.    . potassium chloride (K-DUR) 10 MEQ tablet Take 10 mEq by mouth daily.    . sertraline (ZOLOFT) 50 MG tablet Take 1 tablet (50 mg total) by mouth daily. 30 tablet 2  . potassium chloride (K-DUR) 10 MEQ tablet Take 1 tablet (  10 mEq total) by mouth every other day. (Patient taking differently: Take 10 mEq by mouth daily. ) 45 tablet 3  . labetalol (NORMODYNE) 200 MG tablet Take 1 tablet (200 mg total) by mouth 2 (two) times daily. 60 tablet 0  . levothyroxine (SYNTHROID, LEVOTHROID) 50 MCG tablet TAKE 1 TABLET BY MOUTH  DAILY BEFORE BREAKFAST 90 tablet 1   No facility-administered medications prior to visit.      Allergies:   Iohexol; Iodinated diagnostic agents; and Z-pak [azithromycin]   Social History   Socioeconomic History  . Marital status: Widowed    Spouse name: None  . Number of children: None  . Years of education: None  . Highest education level: None  Social Needs  . Financial resource strain: None  . Food insecurity - worry: None  . Food insecurity - inability: None  . Transportation needs - medical: None  . Transportation needs - non-medical: None  Occupational History  . None  Tobacco Use  . Smoking status: Never Smoker  . Smokeless tobacco: Never Used  Substance and Sexual Activity  . Alcohol use: No  . Drug use: No  . Sexual activity: No  Other Topics Concern  . None  Social History Narrative  . None     Family History:  The patient's family history includes Diabetes in  her sister; Heart disease in her mother; Hypertension in her daughter, son, and son; Stroke in her father.   ROS:   Please see the history of present illness.    ROS All other systems reviewed and are negative.   PHYSICAL EXAM:   VS:  BP (!) 162/90   Pulse (!) 106   Ht 5\' 8"  (1.727 m)   Wt 152 lb (68.9 kg)   BMI 23.11 kg/m    GEN: frail HEENT: normal  Neck: no JVD, carotid bruits, or masses Cardiac: irregular, tachycardic; no rubs, or gallops,no edema  3/6 systolic murmur at apex Respiratory:  Diminished breath sound in the right base GI: soft, nontender, nondistended, + BS MS: no deformity or atrophy  Skin: warm and dry, no rash Neuro:  Alert and Oriented x 3, Strength and sensation are intact Psych: euthymic mood, full affect  Wt Readings from Last 3 Encounters:  06/26/17 152 lb (68.9 kg)  06/18/17 134 lb (60.8 kg)  05/28/17 134 lb (60.8 kg)      Studies/Labs Reviewed:   EKG:  EKG is ordered today.  The ekg ordered today demonstrates atrial fibrillation with RVR  Recent Labs: 08/22/2016: B Natriuretic Peptide 490.8 11/07/2016: ALT 10 01/12/2017: Magnesium 1.6 05/23/2017: TSH 0.240 05/25/2017: Hemoglobin 11.0; Platelets 171 06/26/2017: BUN 11; Creatinine, Ser 0.99; Potassium 3.5; Sodium 142   Lipid Panel    Component Value Date/Time   CHOL 174 11/07/2016 0856   CHOL 142 10/12/2015 0928   TRIG 89 11/07/2016 0856   HDL 47 (L) 11/07/2016 0856   HDL 47 10/12/2015 0928   CHOLHDL 3.7 11/07/2016 0856   VLDL 18 11/07/2016 0856   LDLCALC 109 (H) 11/07/2016 0856   LDLCALC 79 10/12/2015 0928    Additional studies/ records that were reviewed today include:   Echo 08/23/2016 LV EF: 55% -   60%  Study Conclusions  - Left ventricle: The cavity size was normal. Wall thickness was   increased in a pattern of moderate LVH. Systolic function was   normal. The estimated ejection fraction was in the range of 55%   to 60%. Wall motion was normal; there were no  regional wall    motion abnormalities. - Aortic valve: There was mild regurgitation. - Aortic root: The aortic root was mildly dilated. - Ascending aorta: The ascending aorta was mildly dilated. - Mitral valve: Calcified annulus. There was mild regurgitation. - Left atrium: The atrium was severely dilated. - Right ventricle: The cavity size was mildly dilated. - Right atrium: The atrium was severely dilated. - Pulmonary arteries: Systolic pressure was mildly increased. PA   peak pressure: 50 mm Hg (S).  Impressions:  - Normal LV systolic function; mild AI; mildly dilated ascending   aorta (4.3 cm); mild MR; severe biatrial enlargement; mild RVE;   mild TR with mildly elevated pulmonary pressure.    ASSESSMENT:    1. Permanent atrial fibrillation ([place])   2. Pleural effusion on right   3. Left leg swelling   4. Medication management   5. Heart murmur   6. Chronic diastolic heart failure ([place])   7. Hyperlipidemia, unspecified hyperlipidemia type   8. Controlled type 2 diabetes mellitus without complication, without long-term current use of insulin ([place])   9. Hypothyroidism, unspecified type   10. Thoracic aortic aneurysm without rupture ([place])      PLAN:  In order of problems listed above:  1. Permanent atrial fibrillation: Heart rate uncontrolled, increase labetalol to 300 twice daily.  She is not a candidate for systemic anticoagulation due to bleeding risk.  2. Right pleural effusion: Consistent with his physical exam, however decreased breath sound is more prominent than expected.  Given her shortness of breath and uncontrolled heart rate, will need to rule out enlarging right pleural effusion.  3. Heart murmur: She has a 3 out of 6 systolic murmur at the apex.  Although she had a history of moderate MR, last echocardiogram in early 2018 showed mild MR.  This is likely underestimated.   4. LLE edema: no RLE edema, need LLE venous doppler  5. Thoracic aortic aneurysm: 4.8 cm on the  recent image, not a candidate for operation.  There has been some discussion of palliative care consult.    Medication Adjustments/Labs and Tests Ordered: Current medicines are reviewed at length with the patient today.  Concerns regarding medicines are outlined above.  Medication changes, Labs and Tests ordered today are listed in the Patient Instructions below. Patient Instructions  Medication Instructions:   INCREASE labetalol to 300mg  twice daily  Labwork:  BMET TODAY  Testing/Procedures:  Your physician has requested that you have a LEFT lower extremity venous duplex. This test is an ultrasound of the veins in the legs. It looks at venous blood flow that carries blood from the heart to the legs. Allow one hour for a Lower Venous exam. There are no restrictions or special instructions.  A chest x-ray takes a picture of the organs and structures inside the chest, including the heart, lungs, and blood vessels. This test can show several things, including, whether the heart is enlarges; whether fluid is building up in the lungs; and whether pacemaker / defibrillator leads are still in place.  Follow-Up:  On December 10th with Dr. Debara Pickett @ 3:15pm  If you need a refill on your cardiac medications before your next appointment, please call your pharmacy.  Any Other Special Instructions Will Be Listed Below (If Applicable).       Hilbert Corrigan, [place]  06/27/2017 11:03 PM    [facility] [address], [place], [place]  [address] Phone: [phone]; Fax: [phone]

## 2017-06-26 NOTE — Patient Outreach (Signed)
[place] [facility]) Care Management  06/26/2017  Nicole [name] 02-13-1931 [id number]  Assessment- CSW received return email from SNF social worker and was notified that patient will be discharging back home on 06/28/17 per family's request. CSW will be out of the office and return on 07/01/17 and will make referrals for nursing and pharmacy involvement per family's request.   Plan-CSW will follow up on 07/01/17.  Eula Fried, BSW, MSW, [email or web address] Phone: [phone] Fax: [phone]

## 2017-06-26 NOTE — Patient Instructions (Addendum)
Medication Instructions:   INCREASE labetalol to 300mg  twice daily  Labwork:  BMET TODAY  Testing/Procedures:  Your physician has requested that you have a LEFT lower extremity venous duplex. This test is an ultrasound of the veins in the legs. It looks at venous blood flow that carries blood from the heart to the legs. Allow one hour for a Lower Venous exam. There are no restrictions or special instructions.  A chest x-ray takes a picture of the organs and structures inside the chest, including the heart, lungs, and blood vessels. This test can show several things, including, whether the heart is enlarges; whether fluid is building up in the lungs; and whether pacemaker / defibrillator leads are still in place.  Follow-Up:  On December 10th with Dr. Debara Pickett @ 3:15pm  If you need a refill on your cardiac medications before your next appointment, please call your pharmacy.  Any Other Special Instructions Will Be Listed Below (If Applicable).

## 2017-06-26 NOTE — Patient Outreach (Signed)
[facility]) Care Management  06/26/2017  DARRAH DREDGE Oct 05, 1930 [id number]  Assessment- CSW sent secure email to [facility] requesting updates and questioning if a discharge date has been set. CSW will await for return email.  Plan-CSW will follow up next week and continue to wait for discharge.  Eula Fried, BSW, MSW, [email or web address] Phone: [phone] Fax: [phone]

## 2017-06-27 ENCOUNTER — Encounter: Payer: Self-pay | Admitting: Physician Assistant

## 2017-06-27 LAB — BASIC METABOLIC PANEL
BUN/Creatinine Ratio: 11 — ABNORMAL LOW (ref 12–28)
BUN: 11 mg/dL (ref 8–27)
CO2: 31 mmol/L — AB (ref 20–29)
Calcium: 8.3 mg/dL — ABNORMAL LOW (ref 8.7–10.3)
Chloride: 97 mmol/L (ref 96–106)
Creatinine, Ser: 0.99 mg/dL (ref 0.57–1.00)
GFR calc Af Amer: 61 mL/min/{1.73_m2} (ref >59)
GFR calc non Af Amer: 52 mL/min/{1.73_m2} — ABNORMAL LOW (ref >59)
GLUCOSE: 82 mg/dL (ref 65–99)
POTASSIUM: 3.5 mmol/L (ref 3.5–5.2)
SODIUM: 142 mmol/L (ref 134–144)

## 2017-06-29 ENCOUNTER — Emergency Department (HOSPITAL_COMMUNITY): Payer: Medicare Other

## 2017-06-29 ENCOUNTER — Emergency Department (HOSPITAL_COMMUNITY)
Admission: EM | Admit: 2017-06-29 | Discharge: 2017-06-29 | Disposition: A | Discharge status: Home / Self Care | Payer: Medicare Other | Attending: Emergency Medicine | Admitting: Emergency Medicine

## 2017-06-29 DIAGNOSIS — I509 Heart failure, unspecified: Secondary | ICD-10-CM | POA: Diagnosis not present

## 2017-06-29 DIAGNOSIS — I5030 Unspecified diastolic (congestive) heart failure: Secondary | ICD-10-CM | POA: Diagnosis not present

## 2017-06-29 DIAGNOSIS — I4891 Unspecified atrial fibrillation: Secondary | ICD-10-CM | POA: Diagnosis not present

## 2017-06-29 DIAGNOSIS — E039 Hypothyroidism, unspecified: Secondary | ICD-10-CM | POA: Diagnosis not present

## 2017-06-29 DIAGNOSIS — Z79899 Other long term (current) drug therapy: Secondary | ICD-10-CM | POA: Insufficient documentation

## 2017-06-29 DIAGNOSIS — R42 Dizziness and giddiness: Secondary | ICD-10-CM | POA: Diagnosis not present

## 2017-06-29 DIAGNOSIS — R030 Elevated blood-pressure reading, without diagnosis of hypertension: Secondary | ICD-10-CM | POA: Diagnosis not present

## 2017-06-29 DIAGNOSIS — E1121 Type 2 diabetes mellitus with diabetic nephropathy: Secondary | ICD-10-CM | POA: Diagnosis present

## 2017-06-29 DIAGNOSIS — I110 Hypertensive heart disease with heart failure: Secondary | ICD-10-CM | POA: Diagnosis present

## 2017-06-29 DIAGNOSIS — R51 Headache: Secondary | ICD-10-CM | POA: Diagnosis not present

## 2017-06-29 DIAGNOSIS — E1142 Type 2 diabetes mellitus with diabetic polyneuropathy: Secondary | ICD-10-CM | POA: Diagnosis present

## 2017-06-29 DIAGNOSIS — I482 Chronic atrial fibrillation, unspecified: Secondary | ICD-10-CM

## 2017-06-29 DIAGNOSIS — I160 Hypertensive urgency: Secondary | ICD-10-CM | POA: Diagnosis not present

## 2017-06-29 DIAGNOSIS — R0602 Shortness of breath: Secondary | ICD-10-CM | POA: Diagnosis not present

## 2017-06-29 DIAGNOSIS — I130 Hypertensive heart and chronic kidney disease with heart failure and stage 1 through stage 4 chronic kidney disease, or unspecified chronic kidney disease: Secondary | ICD-10-CM | POA: Diagnosis not present

## 2017-06-29 DIAGNOSIS — N189 Chronic kidney disease, unspecified: Secondary | ICD-10-CM | POA: Diagnosis not present

## 2017-06-29 DIAGNOSIS — E1140 Type 2 diabetes mellitus with diabetic neuropathy, unspecified: Secondary | ICD-10-CM | POA: Diagnosis not present

## 2017-06-29 LAB — CBC WITH DIFFERENTIAL/PLATELET
BASOS ABS: 0 10*3/uL (ref 0.0–0.1)
Basophils Relative: 0 %
EOS PCT: 1 %
Eosinophils Absolute: 0.1 10*3/uL (ref 0.0–0.7)
HCT: 35.7 % — ABNORMAL LOW (ref 36.0–46.0)
HEMOGLOBIN: 12.2 g/dL (ref 12.0–15.0)
LYMPHS ABS: 1.1 10*3/uL (ref 0.7–4.0)
LYMPHS PCT: 18 %
MCH: 26 pg (ref 26.0–34.0)
MCHC: 34.2 g/dL (ref 30.0–36.0)
MCV: 76 fL — AB (ref 78.0–100.0)
Monocytes Absolute: 0.4 10*3/uL (ref 0.1–1.0)
Monocytes Relative: 7 %
NEUTROS ABS: 4.4 10*3/uL (ref 1.7–7.7)
NEUTROS PCT: 74 %
PLATELETS: 218 10*3/uL (ref 150–400)
RBC: 4.7 MIL/uL (ref 3.87–5.11)
RDW: 17.7 % — ABNORMAL HIGH (ref 11.5–15.5)
WBC: 5.9 10*3/uL (ref 4.0–10.5)

## 2017-06-29 LAB — BASIC METABOLIC PANEL
ANION GAP: 8 (ref 5–15)
BUN: 8 mg/dL (ref 6–20)
CHLORIDE: 97 mmol/L — AB (ref 101–111)
CO2: 33 mmol/L — ABNORMAL HIGH (ref 22–32)
Calcium: 8.5 mg/dL — ABNORMAL LOW (ref 8.9–10.3)
Creatinine, Ser: 1.12 mg/dL — ABNORMAL HIGH (ref 0.44–1.00)
GFR, EST AFRICAN AMERICAN: 51 mL/min — AB (ref >60)
GFR, EST NON AFRICAN AMERICAN: 44 mL/min — AB (ref >60)
Glucose, Bld: 108 mg/dL — ABNORMAL HIGH (ref 65–99)
Potassium: 3.3 mmol/L — ABNORMAL LOW (ref 3.5–5.1)
Sodium: 138 mmol/L (ref 135–145)

## 2017-06-29 LAB — I-STAT TROPONIN, ED: Troponin i, poc: 0.02 ng/mL (ref 0.00–0.08)

## 2017-06-29 LAB — BRAIN NATRIURETIC PEPTIDE: B NATRIURETIC PEPTIDE 5: 957.4 pg/mL — AB (ref 0.0–100.0)

## 2017-06-29 MED ORDER — HYDRALAZINE HCL 25 MG PO TABS
25.0000 mg | ORAL_TABLET | Freq: Once | ORAL | Status: AC
  Administered 2017-06-29: 25 mg via ORAL
  Filled 2017-06-29: qty 1

## 2017-06-29 MED ORDER — POTASSIUM CHLORIDE CRYS ER 20 MEQ PO TBCR
20.0000 meq | EXTENDED_RELEASE_TABLET | Freq: Once | ORAL | Status: AC
  Administered 2017-06-29: 20 meq via ORAL
  Filled 2017-06-29: qty 1

## 2017-06-29 MED ORDER — FUROSEMIDE 20 MG PO TABS
40.0000 mg | ORAL_TABLET | Freq: Once | ORAL | Status: AC
  Administered 2017-06-29: 40 mg via ORAL
  Filled 2017-06-29: qty 2

## 2017-06-29 MED ORDER — FUROSEMIDE 10 MG/ML IJ SOLN
60.0000 mg | INTRAMUSCULAR | Status: AC
  Administered 2017-06-29: 60 mg via INTRAVENOUS
  Filled 2017-06-29: qty 6

## 2017-06-29 MED ORDER — LABETALOL HCL 200 MG PO TABS
300.0000 mg | ORAL_TABLET | Freq: Once | ORAL | Status: AC
  Administered 2017-06-29: 300 mg via ORAL
  Filled 2017-06-29: qty 2

## 2017-06-29 NOTE — ED Notes (Signed)
Patient transported to X-ray 

## 2017-06-29 NOTE — ED Notes (Signed)
Family at bedside. 

## 2017-06-29 NOTE — ED Notes (Signed)
Patient transported to CT 

## 2017-06-29 NOTE — Discharge Instructions (Signed)
You were seen here today for shortness of breath. You were found to be in heart failure. You were seen by the hospitalist who recommended outpatient therapy. Please fluid restrict to include 1200 mL which is five 8 ounce glasses of water, soup, Jell-O, ice cream, mashed potatoes, and any other liquid. Please weigh daily and ensure that she does not increase in weight by >3lbs. Please take medication as prescribed.  Please do not miss any of your medications.  Please follow up with Dr. Lysbeth Penner office on Monday. If you develop worsening or new concerning symptoms you can return to the emergency department for re-evaluation.

## 2017-06-29 NOTE — ED Provider Notes (Signed)
[facility] EMERGENCY DEPARTMENT Provider Note   CSN: [id number] Arrival date & time: 06/29/17  1113     History   Chief Complaint Chief Complaint  Patient presents with  . Hypertension    HPI Nicole [name] is a 83 y.o. female with a history of colon cancer, AAA, chronic atrial fibrillation (not on coumadin due head bleed as a result of fall), chronic diastolic failure, CKD, diabetes 2, hyperlipidemia, hypothyroidism, CVA who presents the ED department today for elevated blood pressure.  Patient's granddaughter is at bedside and provides most of the history.  Goddaughter notes that she was discharged from [facility] yesterday and returned home where she currently stays with a friend.  Patient did not take her evening or morning medications besides her levothyroxine.  They note that her blood pressure was elevated this morning (unable to recall).  While trying to get her to eat her morning breakfast the patient said that she felt short of breath and lightheaded. Upon my questioning the patient states that she does feel short of breath and also has a headache.  She states it is a generalized headache but is unable to describe it.  She states her lightheadedness is dizziness but denies vertigo.  She states she has been short of breath times 3 months.  The daughter says her breathing is at baseline.  She is currently on 2 L of O2 at home and in the department.  Patient notes that she does have some lower extremity swelling over the last 3 weeks.  She received a lower extremity Doppler ultrasound of the left leg yesterday for this that were negative for DVT.  She was recently admitted to the hospital on 05/21/2017 where she was noted to have A. fib with RVR and hypertension with systolic blood pressure in the 200 range.  This was due to the patient stopping her medication for several weeks.  At that time the patient had MRI of the brain that was negative for any acute reversible  findings.  Patient was treated for UTI but culture grew likely contaminant.  Patient is followed by Dr. Debara Pickett of heart care and was seen on 06/26/2017.  They have a follow-up appointment on the 10th. Patient last CT angiogram of the chest was obtained on 01/11/2017 and showed a 4.8 cm ascending thoracic aortic aneurysm. Patient's like an echocardiogram was obtained on 08/23/2016 and showed ejection fraction of 55-60%, mild aortic insufficiency, mild mitral regurg. She is alert to self and place.  No recent fever, chills, extremity weakness, extremity numbness/tingling, difficulty with speech, aphasia, change in hearing, facial droop, chest pain, abdominal pain, nausea/vomiting/diarrhea.  HPI  Past Medical History:  Diagnosis Date  . AAA (abdominal aortic aneurysm) ([place])   . Anemia   . Anxiety   . Arthritis   . Atrial fibrillation ([place])   . Cervicalgia   . Cholelithiasis   . Chronic systolic heart failure ([place])   . CKD (chronic kidney disease)   . Colon cancer ([place]) 07/1997  . Depression   . Diabetes mellitus ([place])   . Dizziness and giddiness   . Electrolyte and fluid disorders not elsewhere classified   . GERD (gastroesophageal reflux disease)   . Gout, unspecified   . Hemorrhage of rectum and anus   . Hypercholesteremia   . Hypertension   . Hypoglycemia, unspecified   . Hypopotassemia   . Hypothyroidism   . Kidney stone    renal calculi  . Other chronic allergic conjunctivitis   .  Other specified cardiac dysrhythmias(427.89)   . Pain in joint, lower leg   . Perforation of bile duct   . Proteinuria   . Shortness of breath   . Small bowel obstruction due to adhesions ([place]) 05/16/2012  . Stroke ([place])   . Swelling, mass, or lump in head and neck   . Thoracic aneurysm without mention of rupture    4.6 cm Asc Aortic aneurysm, CT 07/2015    Patient Active Problem List   Diagnosis Date Noted  . Major depression 05/28/2017  . Adult failure to thrive 05/28/2017  . Acute  encephalopathy 05/23/2017  . Acute lower UTI 05/23/2017  . Palliative care by specialist   . DNR (do not resuscitate)   . Bradyarrhythmia 01/11/2017  . Generalized weakness 01/11/2017  . General weakness 01/11/2017  . PE (pulmonary thromboembolism) ([place]) 08/24/2016  . Accelerated hypertension with diastolic congestive heart failure, NYHA class 3 ([place]) 01/05/2016  . Permanent atrial fibrillation ([place]) 01/05/2016  . Rib pain on right side 10/14/2015  . Hypothyroidism due to acquired atrophy of thyroid 10/14/2015  . Type 2 diabetes mellitus with diabetic nephropathy, without long-term current use of insulin ([place]) 10/14/2015  . Essential hypertension 10/14/2015  . Acute on chronic systolic heart failure ([place]) 08/02/2015  . Acute on chronic diastolic heart failure ([place]) 08/02/2015  . Bradycardia 07/27/2015  . Chronic anemia 07/27/2015  . Chest pain   . Vascular dementia 06/13/2015  . Memory loss 06/13/2015  . Left hemiparesis ([place])   . Type 2 diabetes mellitus with peripheral neuropathy (HCC)   . ICH (intracerebral hemorrhage) ([place]) 04/04/2015  . Atrial fibrillation, chronic ([place])   . Hypertensive urgency   . SOB (shortness of breath) 04/03/2014  . Diabetic nephropathy ([place]) 09/07/2013  . Chronic constipation 03/25/2013  . Carpal tunnel syndrome 02/25/2013  . Hypokalemia 05/17/2012  . Normal coronary arteries, 2008 05/15/2012  . Type II or unspecified type diabetes mellitus with neurological manifestations, not stated as uncontrolled(250.60) 05/15/2012  . Acute respiratory distress 05/15/2012  . Abdominal pain, acute, right upper quadrant 05/12/2012  . Cholecystitis chronic, acute, S/P lap cholecystectomy 05/14/12 05/12/2012  . Thoracic aortic aneurysm without rupture ([place])   . CHF, past NICM with an EF of 30%, most recent echo 2011 showed EF to be45- 50%   . Anemia 08/09/2011  . History of colon cancer, stage III 08/09/2011    Past Surgical History:  Procedure Laterality Date    . CARDIAC CATHETERIZATION  2008   moderate severe pulm HTN; with elevted pulm capillary wedge pressure   . CHOLECYSTECTOMY  05/14/2012   Procedure: LAPAROSCOPIC CHOLECYSTECTOMY WITH INTRAOPERATIVE CHOLANGIOGRAM;  Surgeon: Haywood Lasso, MD;  Location: [facility];  Service: General;  Laterality: N/A;  . ERCP  05/17/2012   Procedure: ENDOSCOPIC RETROGRADE CHOLANGIOPANCREATOGRAPHY (ERCP);  Surgeon: Missy Sabins, MD;  Location: [facility];  Service: Gastroenterology;  Laterality: N/A;  . ERCP  08/12/2012   Procedure: ENDOSCOPIC RETROGRADE CHOLANGIOPANCREATOGRAPHY (ERCP);  Surgeon: Missy Sabins, MD;  Location[facility] ENDOSCOPY;  Service: Endoscopy;  Laterality: N/A;  . HEMICOLECTOMY  08/11/1997  . KIDNEY STONE SURGERY    . TRANSTHORACIC ECHOCARDIOGRAM  11/2009   EF 45-50%, mild-mod AV regurg; mild MR; mod TR; ascending aorta mildly dilated    OB History    No data available       Home Medications    Prior to Admission medications   Medication Sig Start Date End Date Taking? Authorizing Provider  allopurinol (ZYLOPRIM) 100 MG tablet TAKE 1 TABLET  BY MOUTH  DAILY Patient taking differently: TAKE 100MG  BY MOUTH ONCE DAILY 12/24/16  Yes [name], [name], DO  donepezil (ARICEPT) 10 MG tablet Take one tablet by mouth once daily at bedtime 02/18/17  Yes [name], [name], DO  ferrous sulfate 325 (65 FE) MG EC tablet Take 1 tablet (325 mg total) by mouth 2 (two) times daily with a meal. 02/01/17  Yes Lauree Chandler, NP  furosemide (LASIX) 20 MG tablet Take 40 mg by mouth daily as needed for fluid. for weight gain greater than 3 pounds   Yes [provider]  hydrALAZINE (APRESOLINE) 25 MG tablet Take 75 mg by mouth 3 (three) times daily. Take 3 tablets to = 75 mg TID   Yes [provider]  ipratropium-albuterol (DUONEB) 0.5-2.5 (3) MG/3ML SOLN Take 3 mLs by nebulization every 6 (six) hours as needed (SOB/wheezing).   Yes [provider]  isosorbide mononitrate (IMDUR) 30 MG 24 hr  tablet Take 30 mg by mouth 2 (two) times daily.   Yes [provider]  labetalol (NORMODYNE) 300 MG tablet Take 1 tablet (300 mg total) by mouth 2 (two) times daily. 06/26/17 07/26/17 Yes [name], PA  levothyroxine (SYNTHROID, LEVOTHROID) 25 MCG tablet Take 25 mcg by mouth daily before breakfast.   Yes [provider]  NUTRITIONAL SUPPLEMENT LIQD Take 120 mLs by mouth 2 (two) times daily.   Yes [provider]  OXYGEN Inhale 2 L into the lungs as needed.   Yes [provider]  potassium chloride (K-DUR) 10 MEQ tablet Take 10 mEq by mouth daily.   Yes [provider]  sertraline (ZOLOFT) 50 MG tablet Take 1 tablet (50 mg total) by mouth daily. 03/04/17  Yes Lauree Chandler, NP    Family History Family History  Problem Relation Age of Onset  . Heart disease Mother   . Stroke Father   . Hypertension Daughter   . Hypertension Son   . Diabetes Sister   . Hypertension Son     Social History Social History   Tobacco Use  . Smoking status: Never Smoker  . Smokeless tobacco: Never Used  Substance Use Topics  . Alcohol use: No  . Drug use: No     Allergies   Iohexol; Iodinated diagnostic agents; and Z-pak [azithromycin]   Review of Systems Review of Systems  All other systems reviewed and are negative.    Physical Exam Updated Vital Signs BP (!) 166/104   Pulse (!) 105   Temp 97.9 F (36.6 C) (Oral)   Resp (!) 23   SpO2 99%   Physical Exam  Constitutional: She appears well-developed and well-nourished.  Elderly female.  Currently on 2 L O2.  HENT:  Head: Normocephalic and atraumatic.  Right Ear: External ear normal.  Left Ear: External ear normal.  Nose: Nose normal.  Mouth/Throat: Uvula is midline, oropharynx is clear and moist and mucous membranes are normal. No tonsillar exudate.  Eyes: Pupils are equal, round, and reactive to light. Right eye exhibits no discharge. Left eye exhibits no discharge. No scleral icterus.    Neck: Trachea normal and full passive range of motion without pain. Neck supple. JVD present. No spinous process tenderness present. No neck rigidity. Normal range of motion present.  Cardiovascular: Normal rate and intact distal pulses. An irregularly irregular rhythm present.  Murmur heard.  Systolic murmur is present. Pulses:      Radial pulses are 2+ on the right side, and 2+ on the  left side.       Dorsalis pedis pulses are 2+ on the right side, and 2+ on the left side.       Posterior tibial pulses are 2+ on the right side, and 2+ on the left side.  Left lower extremity 1 + edema to the pre-tibial region.   Pulmonary/Chest: Effort normal. No accessory muscle usage. No tachypnea. She has decreased breath sounds in the right lower field. She exhibits no tenderness.  Abdominal: Soft. Bowel sounds are normal. There is no tenderness. There is no rebound and no guarding.  Musculoskeletal: She exhibits no edema.  Lymphadenopathy:    She has no cervical adenopathy.  Neurological: She is alert.  Mental Status:  Alert, oriented to person and place (not time), thought content appropriate, able to give a coherent history. Speech fluent without evidence of aphasia. Able to follow 2 step commands without difficulty.  Cranial Nerves:  II:  Peripheral visual fields grossly normal, pupils equal, round, reactive to light III,IV, VI: ptosis not present, extra-ocular motions intact bilaterally  V,VII: smile symmetric, eyebrows raise symmetric, facial light touch sensation equal VIII: hearing grossly normal to voice  X: uvula elevates symmetrically  XI: bilateral shoulder shrug symmetric and strong XII: midline tongue extension without fassiculations Motor:  Normal tone. 5/5 in upper and lower extremities bilaterally including strong and equal grip strength and dorsiflexion/plantar flexion Sensory: Sensation intact to light touch in all extremities. Negative Romberg.  Deep Tendon Reflexes: 2+ and  symmetric in the biceps and patella Cerebellar: normal finger-to-nose with bilateral upper extremities. No pronator drift.  Gait: gait able but slow.  CV: distal pulses palpable throughout    Skin: Skin is warm and dry. No rash noted. She is not diaphoretic.  Psychiatric: She has a normal mood and affect.  Nursing note and vitals reviewed.    ED Treatments / Results  Labs (all labs ordered are listed, but only abnormal results are displayed) Labs Reviewed  BASIC METABOLIC PANEL - Abnormal; Notable for the following components:      Result Value   Potassium 3.3 (*)    Chloride 97 (*)    CO2 33 (*)    Glucose, Bld 108 (*)    Creatinine, Ser 1.12 (*)    Calcium 8.5 (*)    GFR calc non Af Amer 44 (*)    GFR calc Af Amer 51 (*)    All other components within normal limits  CBC WITH DIFFERENTIAL/PLATELET - Abnormal; Notable for the following components:   HCT 35.7 (*)    MCV 76.0 (*)    RDW 17.7 (*)    All other components within normal limits  BRAIN NATRIURETIC PEPTIDE - Abnormal; Notable for the following components:   B Natriuretic Peptide 957.4 (*)    All other components within normal limits  I-STAT TROPONIN, ED    EKG  EKG Interpretation  Date/Time:  Saturday June 29 2017 11:20:21 EST Ventricular Rate:  99 PR Interval:    QRS Duration: 111 QT Interval:  409 QTC Calculation: 525 R Axis:   54 Text Interpretation:  Atrial fibrillation Ventricular premature complex RSR' in V1 or V2, right VCD or RVH LVH with secondary repolarization abnormality Prolonged QT interval No significant change since last tracing Confirmed by Wandra Arthurs [phone]) on 06/29/2017 1:15:59 PM       Radiology Dg Chest 2 View  Result Date: 06/29/2017 CLINICAL DATA:  83 year old female with shortness of breath. EXAM: CHEST  2 VIEW COMPARISON:  05/23/2017  FINDINGS: Cardiomediastinal silhouette remains enlarged. There is pulmonary vascular congestion and a likely component of mild interstitial  edema. Enlarging bibasilar pleural effusions, right greater than left with associated atelectasis. Superimposed consolidation not excluded. No pneumothorax. No acute osseous abnormalities. IMPRESSION: Worsening signs of CHF with cardiomegaly, edema and enlarging bibasilar pleural effusions. Superimposed consolidation difficult to entirely exclude. Electronically Signed   By: Kristopher Oppenheim M.D.   On: 06/29/2017 13:00   Ct Head Wo Contrast  Result Date: 06/29/2017 CLINICAL DATA:  Headache and dizziness since this morning. EXAM: CT HEAD WITHOUT CONTRAST TECHNIQUE: Contiguous axial images were obtained from the base of the skull through the vertex without intravenous contrast. COMPARISON:  Brain MRI, brain MRI and head CT, 05/23/2017. FINDINGS: Brain: No evidence of acute infarction, hemorrhage, hydrocephalus, extra-axial collection or mass lesion/mass effect. There is ventricular sulcal enlargement reflecting age appropriate volume loss. Patchy white matter hypoattenuation is noted consistent with moderate chronic microvascular ischemic change. Vascular: No hyperdense vessel or unexpected calcification. Skull: Normal. Negative for fracture or focal lesion. Sinuses/Orbits: Globes and orbits are unremarkable. Mild dependent mucosal thickening in the left sphenoid sinus. Visualized sinuses otherwise clear. Clear mastoid air cells. Other: None. IMPRESSION: 1. No acute intracranial abnormalities. 2. Age related volume loss. Moderate chronic microvascular ischemic change. Electronically Signed   By: Lajean Manes M.D.   On: 06/29/2017 13:27    Procedures Procedures (including critical care time)  Medications Ordered in ED Medications  labetalol (NORMODYNE) tablet 300 mg (300 mg Oral Given 06/29/17 1243)  furosemide (LASIX) tablet 40 mg (40 mg Oral Given 06/29/17 1244)  hydrALAZINE (APRESOLINE) tablet 25 mg (25 mg Oral Given 06/29/17 1245)  potassium chloride SA (K-DUR,KLOR-CON) CR tablet 20 mEq (20 mEq Oral  Given 06/29/17 1258)  furosemide (LASIX) injection 60 mg (60 mg Intravenous Given 06/29/17 1516)     Initial Impression / Assessment and Plan / ED Course  I have reviewed the triage vital signs and the nursing notes.  Pertinent labs & imaging results that were available during my care of the patient were reviewed by me and considered in my medical decision making (see chart for details).     83 year old female presenting the emerge department today for elevated blood pressure.  Patient recently discharged from [facility] home yesterday.  She did not get her evening medications are her morning medication.  She currently resides at home with a friend.  Daughter notes that this morning her blood pressure was elevated and when the aide came to see her she felt short of breath and lightheaded with associated generalized headache.  Daughter says breathing is currently at baseline.  She is currently on 2 L oxygen which is baseline at home.  She saw cardiology on 5 December and was noted to have pleural effusion without increasing size.  She had negative Doppler ultrasounds of the left lower extremity due to recent increase in lower extremity swelling, worse on the left the last 3 weeks.  She has follow-up with Dr. Debara Pickett on December 10.  Patient denies any chest pain.  On exam the patient has JVD, irregular irregular rhythm with systolic murmur, left lower extremity 1+ pitting edema to the pretibial region, decreased breath sounds on the right side and normal neurologic exam without any focal neurologic deficits.   Patient noted to be hypertensive in the department.  I reordered her home BP medications. Troponin 0.02.  No leukocytosis.  Hemoglobin at baseline.  Mild hypokalemia that is replaced orally.  Slight bump in creatinine  but will hold off fluid as suspect patient is in heart failure.  BNP 957.4.  Chest x-ray shows worsening signs of CHF with cardiomegaly, edema, enlarging bibasilar pleural  effusions.  Lasix given in the department.   CT head without any acute intracranial normalities.  Patient headache resolved.  Blood pressure has been downtrending since administration of her home medications.  For the patient would benefit from admission for CHF.  Will call hospitalist for admission.  Discussed case with Dr. Evangeline Gula who will consult on the patient.   I appreciate the consult from Dr. Evangeline Gula.  After speaking with her she feels the patient will do okay with treatment at home.  Advised the patient to fluid restrict, take home medications and follow-up with cardiologist on Monday morning at scheduled appointment.  Patient was encouraged to return back if they have any further difficulties.  I discussed strict return precautions with family and made them aware of this.  They stated they were already aware of this from the hospitalist.  Will discharge the patient home.  She appears in no acute respiratory distress and is hemodynamically stable with blood pressure downtrending.   Patient case seen and discussed with Dr. Darl Householder who is in agreement with plan.   Final Clinical Impressions(s) / ED Diagnoses   Final diagnoses:  Acute on chronic heart failure, unspecified heart failure type [facility])  Hypertensive urgency    ED Discharge Orders    None       Lorelle Gibbs 06/29/17 1642    Drenda Freeze, MD 06/30/17 [phone]

## 2017-06-29 NOTE — ED Triage Notes (Signed)
Pt BIB EMS from home for HTN. Per EMS pt home health nurse came out this morning and called EMS d/t HTN and SOB x 3 mos. Pt did not receive BP meds last night or this morning. Pt in afib rvr with EMS, received 10 mg cardizem PTA. Pt reports feeling dizzy earlier today, no facial droop or arm weakness. Pt has hx dementia; alert to self and place. Resp e/u; on home O2. NAD.

## 2017-06-29 NOTE — Consult Note (Signed)
Medical Consultation   Nicole [name]  YQI:[id number]  DOB: 18-Feb-1931  DOA: 06/29/2017  PCP: [name], DO   Outpatient Specialists: Dr. Debara Pickett from cardiology   Requesting physician: Reuben Likes  Reason for consultation: Hypertension and increased volume  History of Present Illness: Nicole [name] is an 83 y.o. female with a past medical history significant for colon cancer, chronic atrial fibrillation not on Coumadin due to a head bleed, chronic diastolic congestive heart failure, abdominal aortic aneurysm, chronic kidney disease, diabetes type 2, hyperlipidemia, hypothyroidism, stroke who came to the ER today due to elevated blood pressure.  She was just discharged from [facility] home yesterday.  The patient did not get her evening medications or her morning medications.  She stays with a friend and apparently these medications were not given except for her levothyroxine.  Her blood pressure was elevated this morning and when the aide came to see her she felt short of breath and lightheaded.  Had associated generalized headache.  According to the patient's daughter her breathing is at baseline and she is on her current home dose of oxygen which is 2 L in the emergency department.  She saw Dr. Debara Pickett on 5 December and a pleural effusion was noted and increasing in size and the plan was to recheck her on the 10th after increasing her labetalol.  A cardiogram obtained in February 2018 showed an EF of 55-60%, mild aortic insufficiency, diastolic dysfunction and mitral regurgitation. Family states that they are unaware of what a fluid restriction is.  And that she does not limit her fluids in fact they state that they are concerned that she is not drinking enough.  Patient's granddaughter also states that she brought her in because she has been worried about the snow and is worried if she gets worse what will they do.  But clinically in the emergency  department the patient is not requiring any additional oxygen after initial vital signs were obtained her heart rate has come down to 97 and her blood pressure is 156/94 her sats are acceptable on home oxygen dosing. Patient's alert and oriented she is interested in returning home.     Review of Systems:  As per HPI otherwise 10 point review of systems negative.    Past Medical History: Past Medical History:  Diagnosis Date  . AAA (abdominal aortic aneurysm) ([place])   . Anemia   . Anxiety   . Arthritis   . Atrial fibrillation ([place])   . Cervicalgia   . Cholelithiasis   . Chronic systolic heart failure ([place])   . CKD (chronic kidney disease)   . Colon cancer ([place]) 07/1997  . Depression   . Diabetes mellitus ([place])   . Dizziness and giddiness   . Electrolyte and fluid disorders not elsewhere classified   . GERD (gastroesophageal reflux disease)   . Gout, unspecified   . Hemorrhage of rectum and anus   . Hypercholesteremia   . Hypertension   . Hypoglycemia, unspecified   . Hypopotassemia   . Hypothyroidism   . Kidney stone    renal calculi  . Other chronic allergic conjunctivitis   . Other specified cardiac dysrhythmias(427.89)   . Pain in joint, lower leg   . Perforation of bile duct   . Proteinuria   . Shortness of breath   . Small bowel obstruction due to adhesions ([place]) 05/16/2012  . Stroke ([place])   .  Swelling, mass, or lump in head and neck   . Thoracic aneurysm without mention of rupture    4.6 cm Asc Aortic aneurysm, CT 07/2015    Past Surgical History: Past Surgical History:  Procedure Laterality Date  . CARDIAC CATHETERIZATION  2008   moderate severe pulm HTN; with elevted pulm capillary wedge pressure   . CHOLECYSTECTOMY  05/14/2012   Procedure: LAPAROSCOPIC CHOLECYSTECTOMY WITH INTRAOPERATIVE CHOLANGIOGRAM;  Surgeon: Haywood Lasso, MD;  Location: [facility];  Service: General;  Laterality: N/A;  . ERCP  05/17/2012   Procedure: ENDOSCOPIC RETROGRADE  CHOLANGIOPANCREATOGRAPHY (ERCP);  Surgeon: Missy Sabins, MD;  Location: [facility];  Service: Gastroenterology;  Laterality: N/A;  . ERCP  08/12/2012   Procedure: ENDOSCOPIC RETROGRADE CHOLANGIOPANCREATOGRAPHY (ERCP);  Surgeon: Missy Sabins, MD;  Location: [facility] ENDOSCOPY;  Service: Endoscopy;  Laterality: N/A;  . HEMICOLECTOMY  08/11/1997  . KIDNEY STONE SURGERY    . TRANSTHORACIC ECHOCARDIOGRAM  11/2009   EF 45-50%, mild-mod AV regurg; mild MR; mod TR; ascending aorta mildly dilated     Allergies:   Allergies  Allergen Reactions  . Iohexol Nausea And Vomiting  . Iodinated Diagnostic Agents Nausea And Vomiting  . Z-Pak [Azithromycin]     Reaction unknown by family     Social History:  reports that  has never smoked. she has never used smokeless tobacco. She reports that she does not drink alcohol or use drugs.   Family History: Family History  Problem Relation Age of Onset  . Heart disease Mother   . Stroke Father   . Hypertension Daughter   . Hypertension Son   . Diabetes Sister   . Hypertension Son      Physical Exam: Vitals:   06/29/17 1200 06/29/17 1243 06/29/17 1330 06/29/17 1341  BP: (!) 191/140 (!) 184/128 (!) 151/111 (!) 156/94  Pulse: (!) 134 68 97   Resp: (!) 29 (!) 26 (!) 23   Temp:      TempSrc:      SpO2: 98% 100% 100%     Constitutional:   Alert and awake, oriented x3, not in any acute distress. Eyes: PERLA, EOMI, irises appear normal, anicteric sclera,  ENMT: external ears and nose appear normal, normal hearing            Lips appear normal, oropharynx mucosa, tongue, posterior pharynx appear normal  Neck: neck appears normal, no masses, normal ROM, no thyromegaly, no JVD  CVS: S1-S2 clear, no murmur rubs or gallops, no LE edema, normal pedal pulses  Respiratory:  clear to auscultation bilaterally, no wheezing, no rhonchi, minimal rales at the bases.  Respiratory effort normal. No accessory muscle use.  Abdomen: soft nontender, nondistended, normal bowel  sounds, no hepatosplenomegaly, no hernias  Musculoskeletal: : no cyanosis, clubbing or edema noted bilaterally Neuro: Cranial nerves II-XII intact, strength, sensation, reflexes Psych: judgement and insight appear normal, stable mood and affect, mental status Skin: no rashes or lesions or ulcers, no induration or nodules     Data reviewed:  I have personally reviewed following labs and imaging studies Labs:  CBC: Recent Labs  Lab 06/29/17 1204  WBC 5.9  NEUTROABS 4.4  HGB 12.2  HCT 35.7*  MCV 76.0*  PLT 621    Basic Metabolic Panel: Recent Labs  Lab 06/26/17 1254 06/29/17 1204  NA 142 138  K 3.5 3.3*  CL 97 97*  CO2 31* 33*  GLUCOSE 82 108*  BUN 11 8  CREATININE 0.99 1.12*  CALCIUM 8.3* 8.5*  GFR Estimated Creatinine Clearance: 37.7 mL/min (A) (by C-G formula based on SCr of 1.12 mg/dL (H)). Liver Function Tests: No results for input(s): AST, ALT, ALKPHOS, BILITOT, PROT, ALBUMIN in the last 168 hours. No results for input(s): LIPASE, AMYLASE in the last 168 hours. No results for input(s): AMMONIA in the last 168 hours. Coagulation profile No results for input(s): INR, PROTIME in the last 168 hours.  Cardiac Enzymes: No results for input(s): CKTOTAL, CKMB, CKMBINDEX, TROPONINI in the last 168 hours. BNP: Invalid input(s): POCBNP CBG: No results for input(s): GLUCAP in the last 168 hours. D-Dimer No results for input(s): DDIMER in the last 72 hours. Hgb A1c No results for input(s): HGBA1C in the last 72 hours. Lipid Profile No results for input(s): CHOL, HDL, LDLCALC, TRIG, CHOLHDL, LDLDIRECT in the last 72 hours. Thyroid function studies No results for input(s): TSH, T4TOTAL, T3FREE, THYROIDAB in the last 72 hours.  Invalid input(s): FREET3 Anemia work up No results for input(s): VITAMINB12, FOLATE, FERRITIN, TIBC, IRON, RETICCTPCT in the last 72 hours. Urinalysis    Component Value Date/Time   COLORURINE AMBER (A) 05/23/2017 0156   APPEARANCEUR HAZY  (A) 05/23/2017 0156   LABSPEC 1.026 05/23/2017 0156   PHURINE 5.0 05/23/2017 0156   GLUCOSEU NEGATIVE 05/23/2017 0156   HGBUR NEGATIVE 05/23/2017 0156   BILIRUBINUR SMALL (A) 05/23/2017 0156   KETONESUR NEGATIVE 05/23/2017 0156   PROTEINUR >=300 (A) 05/23/2017 0156   UROBILINOGEN 2.0 (H) 04/13/2015 1844   NITRITE NEGATIVE 05/23/2017 0156   LEUKOCYTESUR NEGATIVE 05/23/2017 0156     Microbiology No results found for this or any previous visit (from the past 240 hour(s)).     Inpatient Medications:   Scheduled Meds: . furosemide  60 mg Intravenous STAT   Continuous Infusions:   Radiological Exams on Admission: Dg Chest 2 View  Result Date: 06/29/2017 CLINICAL DATA:  83 year old female with shortness of breath. EXAM: CHEST  2 VIEW COMPARISON:  05/23/2017 FINDINGS: Cardiomediastinal silhouette remains enlarged. There is pulmonary vascular congestion and a likely component of mild interstitial edema. Enlarging bibasilar pleural effusions, right greater than left with associated atelectasis. Superimposed consolidation not excluded. No pneumothorax. No acute osseous abnormalities. IMPRESSION: Worsening signs of CHF with cardiomegaly, edema and enlarging bibasilar pleural effusions. Superimposed consolidation difficult to entirely exclude. Electronically Signed   By: Kristopher Oppenheim M.D.   On: 06/29/2017 13:00   Ct Head Wo Contrast  Result Date: 06/29/2017 CLINICAL DATA:  Headache and dizziness since this morning. EXAM: CT HEAD WITHOUT CONTRAST TECHNIQUE: Contiguous axial images were obtained from the base of the skull through the vertex without intravenous contrast. COMPARISON:  Brain MRI, brain MRI and head CT, 05/23/2017. FINDINGS: Brain: No evidence of acute infarction, hemorrhage, hydrocephalus, extra-axial collection or mass lesion/mass effect. There is ventricular sulcal enlargement reflecting age appropriate volume loss. Patchy white matter hypoattenuation is noted consistent with  moderate chronic microvascular ischemic change. Vascular: No hyperdense vessel or unexpected calcification. Skull: Normal. Negative for fracture or focal lesion. Sinuses/Orbits: Globes and orbits are unremarkable. Mild dependent mucosal thickening in the left sphenoid sinus. Visualized sinuses otherwise clear. Clear mastoid air cells. Other: None. IMPRESSION: 1. No acute intracranial abnormalities. 2. Age related volume loss. Moderate chronic microvascular ischemic change. Electronically Signed   By: Lajean Manes M.D.   On: 06/29/2017 13:27   EKG shows atrial fibrillation at 99 bpm.  Impression/Recommendations Principal Problem:   Accelerated hypertension with diastolic congestive heart failure, NYHA class 3 (HCC) Active Problems:   Atrial fibrillation, chronic (  [place])   Type 2 diabetes mellitus with peripheral neuropathy (HCC)   Diabetic nephropathy ([place])  Believe the patient's problems are related to missing her doses of medications and not following a fluid restriction.  She has close follow-up with a cardiologist on the 10th.  I recommended that the ER give her an additional dose of IV Lasix prior to discharge in addition to the oral Lasix she has received.  I also instructed the patient and the family on a fluid restriction to include 1200 mL which I equated to 5 8 ounce glasses of water, soup, Jell-O, ice cream, mashed potatoes, and any other liquid.  I encouraged the family to weigh her daily and ensure that she does not miss the medication.  Missing her medications the patient is likely to go back into atrial fibrillation.  I also encouraged him to follow the new dosing of her medicine as prescribed by Dr. Lysbeth Penner office which is the labetalol 200 twice daily.  I believe at this point the patient stable for discharge home if she is not showing any evidence of decompensation and her vital signs.  I encouraged her family to bring her back if she has any further difficulties.   Thank you for this  consultation.  Our [facility] hospitalist team will follow the patient with you.   Time Spent: 58 min  Lady Deutscher M.D. [facility] 06/29/2017, 2:22 PM

## 2017-07-01 ENCOUNTER — Other Ambulatory Visit: Payer: Self-pay | Admitting: Licensed Clinical Social Worker

## 2017-07-01 ENCOUNTER — Ambulatory Visit: Payer: Medicare Other | Admitting: Internal Medicine

## 2017-07-01 NOTE — Patient Outreach (Signed)
[facility]) Care Management  07/01/2017  Nicole [name] [birth year] [id number]  Assessment- Patient discharged back home from SNF on 06/28/17 and went to ED on 06/29/17 for hypertension and SOB. CSW successfully completed orders for both nursing and pharmacy involvement.  Plan-CSW will continue to provide social work assistance and support to assist with transition back home.  Eula Fried, BSW, MSW, [email or web address] Phone: [phone] Fax: [phone]

## 2017-07-02 ENCOUNTER — Telehealth: Payer: Self-pay

## 2017-07-02 ENCOUNTER — Other Ambulatory Visit: Payer: Self-pay | Admitting: Licensed Clinical Social Worker

## 2017-07-02 ENCOUNTER — Other Ambulatory Visit: Payer: Self-pay | Admitting: *Deleted

## 2017-07-02 NOTE — Patient Outreach (Signed)
[facility]ole [name] [facility]) Care Management  07/02/2017  Nicole [name] 1930-12-30 [id number]   Referral received from LCSW for transition of care as member was recently discharged from SNF.  She was admitted to hospital on 11/1 for acute encephalopathy, discharged to SNF on 11/5, discharged from SNF on 12/7.  She was seen in the ED on 12/8 for hypertension and heart failure.  Per chart, she also has history of thoracic aortic aneurysm, atrial fibrillation, PE, colon cancer, and diabetes (A1C - 6.8).  Call placed to member for initial transition of care, state this is not a good time to talk.  Will follow up within the next 2 days per her request.  Valente David, MSN, RN [place] Manager [phone]

## 2017-07-02 NOTE — Patient Outreach (Addendum)
[facility]) Care Management  07/02/2017  Nicole [name] 06/01/1931 [id number]  Assessment- CSW completed outreach call to patient and she answered. She reports doing "well" since returning back home from [facility] on 06/27/17. Patient reports having aides, friends or family constantly with her throughout the entire day and night to ensure that she is not left alone. Patient's friend [name] is currently at her residence with her. Patient denied having any social work needs at this time. She was reminded that family wanted CSW to make orders for both [facility] nursing and pharmacy and that they will be contacting her within the next few weeks. Patient expressed understanding. Patient continues to deal with ongoing grief (several deaths within the family recently) and patient is agreeable to [place] mailing out grief resources to her since she cannot find the handout that was previously provided to her during SNF visit. CSW will mail requested grief resources to patient and then will review them over the phone again during next outreach call.  [facility] CM Care Plan Problem One     Most Recent Value  Care Plan Problem One  SNF admission  Role Documenting the Problem One  Clinical Social Worker  Care Plan for Problem One  Active  [facility] Long Term Goal   Patient will have a safe and stable discharge back home within 90 days as evidenced by patient's reporting  [facility] Long Term Goal Start Date  05/28/17  Interventions for Problem One Long Term Goal  SNF visit completed today. SNF social worker reports patient having a very strong support network of family and friends. No discharge date has been set at this time but patient is progressing in PT.   [facility] CM Short Term Goal #1   Patient will attend all scheduled medical appointments within 30 days at SNF as evidenced by patient's reporting  [facility] CM Short Term Goal #1 Start Date  05/28/17  [facility] CM Short Term Goal #1 Met Date  07/02/17  Interventions for Short  Term Goal #1  Goal met. All appointments were attended successfully per patient report  [facility] CM Short Term Goal #2   Patient will be able to read back 1 community resource within [facility] within 30 days  [facility] CM Short Term Goal #2 Start Date  07/02/17  Interventions for Short Term Goal #2  Grief resources will be mailed to patient as she cannot find community resource handout that was provided to her during SNF visit. CSW will complete review of these resources during next outreach call. GOAL WAS ADJUSTED ON 12/11/     Plan-CSW will mail out requested grief resources to patient's residence. CSW will follow up within two weeks.  Eula Fried, BSW, MSW, [email or web address] Phone: [phone] Fax: [phone]

## 2017-07-02 NOTE — Telephone Encounter (Signed)
I have made the 1st attempt to contact the patient or family member in charge, in order to follow up from recently being discharged from the hospital. The phone kept ringing with no option to leave a voicemail. I will attempt at a different time.    Transition Care Management Follow-Up Telephone Call   Date discharged and where: [facility] on 06/29/2017  How have you been since you were released from the hospital? Pretty good, no weakness. Has been taking medications  Any patient concerns? Nothing new has come up  Items Reviewed:   Meds: Y  Allergies: Y  Dietary Changes Reviewed: Y  Functional Questionnaire:  Independent-I Dependent-D  ADLs:   Dressing- I    Eating- I   Maintaining continence- I   Transferring- Assistance of [employer]- D   Meal Prep- D   Managing Meds- I w/ assist  Confirmed importance and Date/Time of follow-up visits scheduled: Yes, Dr. Mariea Clonts 12/20 @ 7:30am   Confirmed with patient if condition worsens to call PCP or go to the Emergency Dept. Patient was given office number and encouraged to call back with questions or concerns: Yes

## 2017-07-03 ENCOUNTER — Other Ambulatory Visit: Payer: Self-pay | Admitting: *Deleted

## 2017-07-03 DIAGNOSIS — R69 Illness, unspecified: Secondary | ICD-10-CM | POA: Diagnosis not present

## 2017-07-03 DIAGNOSIS — R278 Other lack of coordination: Secondary | ICD-10-CM | POA: Diagnosis not present

## 2017-07-03 DIAGNOSIS — I1310 Hypertensive heart and chronic kidney disease without heart failure, with stage 1 through stage 4 chronic kidney disease, or unspecified chronic kidney disease: Secondary | ICD-10-CM | POA: Diagnosis not present

## 2017-07-03 DIAGNOSIS — N183 Chronic kidney disease, stage 3 (moderate): Secondary | ICD-10-CM | POA: Diagnosis not present

## 2017-07-03 DIAGNOSIS — Z85038 Personal history of other malignant neoplasm of large intestine: Secondary | ICD-10-CM | POA: Diagnosis not present

## 2017-07-03 DIAGNOSIS — E1122 Type 2 diabetes mellitus with diabetic chronic kidney disease: Secondary | ICD-10-CM | POA: Diagnosis not present

## 2017-07-03 DIAGNOSIS — M6281 Muscle weakness (generalized): Secondary | ICD-10-CM | POA: Diagnosis not present

## 2017-07-03 DIAGNOSIS — I482 Chronic atrial fibrillation: Secondary | ICD-10-CM

## 2017-07-03 DIAGNOSIS — I714 Abdominal aortic aneurysm, without rupture: Secondary | ICD-10-CM

## 2017-07-03 DIAGNOSIS — I5022 Chronic systolic (congestive) heart failure: Secondary | ICD-10-CM | POA: Diagnosis not present

## 2017-07-03 DIAGNOSIS — F0150 Vascular dementia without behavioral disturbance: Secondary | ICD-10-CM

## 2017-07-03 NOTE — Patient Outreach (Signed)
[name]) Care Management  07/03/2017  Nicole [name] April 15, 1931 [id number]   Second attempt made to initiate transition of care program.  Female answering phone report to be the member's aide state that she is sleeping.  Requested for member to call this care manager back when available.  Will make 3rd attempt to engage within the next 2 business days.  Valente David, MSN, RN [facility] Care Management  [facility] Manager [phone]

## 2017-07-03 NOTE — Patient Outreach (Signed)
Request received from Brooke Joyce, LCSW to mail patient personal care resources.  Information mailed today. 

## 2017-07-04 ENCOUNTER — Other Ambulatory Visit: Payer: Self-pay | Admitting: *Deleted

## 2017-07-04 MED ORDER — POTASSIUM CHLORIDE ER 10 MEQ PO TBCR: 20.0000 meq | EXTENDED_RELEASE_TABLET | Freq: Every day | ORAL | 3 refills | Status: DC

## 2017-07-04 NOTE — Patient Outreach (Signed)
[facility]) Care Management  07/04/2017  CATELYN FRIEL Apr 02, 1931 [id number]   3rd attempt made to contact member for transition of care, unsuccessful.  Female answering phone state member is not available, request made for member to call this care manager when available.  Will await call back.  Will also send unsuccessful outreach letter to member, if no response in 10 business days will close case.  Will contact LCSW regarding inability to engage member at this time, but will continue to collaborate for plan of care if needed.  Valente David, [place], MSN [place] [phone]

## 2017-07-05 DIAGNOSIS — E1122 Type 2 diabetes mellitus with diabetic chronic kidney disease: Secondary | ICD-10-CM | POA: Diagnosis not present

## 2017-07-05 DIAGNOSIS — I714 Abdominal aortic aneurysm, without rupture: Secondary | ICD-10-CM | POA: Diagnosis not present

## 2017-07-05 DIAGNOSIS — I1310 Hypertensive heart and chronic kidney disease without heart failure, with stage 1 through stage 4 chronic kidney disease, or unspecified chronic kidney disease: Secondary | ICD-10-CM | POA: Diagnosis not present

## 2017-07-05 DIAGNOSIS — M6281 Muscle weakness (generalized): Secondary | ICD-10-CM | POA: Diagnosis not present

## 2017-07-05 DIAGNOSIS — R278 Other lack of coordination: Secondary | ICD-10-CM | POA: Diagnosis not present

## 2017-07-05 DIAGNOSIS — Z85038 Personal history of other malignant neoplasm of large intestine: Secondary | ICD-10-CM | POA: Diagnosis not present

## 2017-07-05 DIAGNOSIS — N183 Chronic kidney disease, stage 3 (moderate): Secondary | ICD-10-CM | POA: Diagnosis not present

## 2017-07-05 DIAGNOSIS — I482 Chronic atrial fibrillation: Secondary | ICD-10-CM | POA: Diagnosis not present

## 2017-07-05 DIAGNOSIS — I5022 Chronic systolic (congestive) heart failure: Secondary | ICD-10-CM | POA: Diagnosis not present

## 2017-07-08 ENCOUNTER — Ambulatory Visit: Payer: Medicare Other | Admitting: Internal Medicine

## 2017-07-09 DIAGNOSIS — R278 Other lack of coordination: Secondary | ICD-10-CM | POA: Diagnosis not present

## 2017-07-09 DIAGNOSIS — Z85038 Personal history of other malignant neoplasm of large intestine: Secondary | ICD-10-CM | POA: Diagnosis not present

## 2017-07-09 DIAGNOSIS — E1122 Type 2 diabetes mellitus with diabetic chronic kidney disease: Secondary | ICD-10-CM | POA: Diagnosis not present

## 2017-07-09 DIAGNOSIS — I5022 Chronic systolic (congestive) heart failure: Secondary | ICD-10-CM | POA: Diagnosis not present

## 2017-07-09 DIAGNOSIS — N183 Chronic kidney disease, stage 3 (moderate): Secondary | ICD-10-CM | POA: Diagnosis not present

## 2017-07-09 DIAGNOSIS — I714 Abdominal aortic aneurysm, without rupture: Secondary | ICD-10-CM | POA: Diagnosis not present

## 2017-07-09 DIAGNOSIS — M6281 Muscle weakness (generalized): Secondary | ICD-10-CM | POA: Diagnosis not present

## 2017-07-09 DIAGNOSIS — I1310 Hypertensive heart and chronic kidney disease without heart failure, with stage 1 through stage 4 chronic kidney disease, or unspecified chronic kidney disease: Secondary | ICD-10-CM | POA: Diagnosis not present

## 2017-07-09 DIAGNOSIS — I482 Chronic atrial fibrillation: Secondary | ICD-10-CM | POA: Diagnosis not present

## 2017-07-10 ENCOUNTER — Ambulatory Visit (INDEPENDENT_AMBULATORY_CARE_PROVIDER_SITE_OTHER): Payer: Medicare Other

## 2017-07-10 VITALS — BP 132/64 | HR 73 | Temp 96.3°F | Ht 68.0 in | Wt 150.0 lb

## 2017-07-10 DIAGNOSIS — Z135 Encounter for screening for eye and ear disorders: Secondary | ICD-10-CM

## 2017-07-10 DIAGNOSIS — Z0000 Encounter for general adult medical examination without abnormal findings: Secondary | ICD-10-CM | POA: Diagnosis not present

## 2017-07-10 MED ORDER — ZOSTER VAC RECOMB ADJUVANTED 50 MCG/0.5ML IM SUSR: 0.5000 mL | Freq: Once | INTRAMUSCULAR | 1 refills | Status: AC

## 2017-07-10 NOTE — Patient Instructions (Signed)
Nicole [name] , Thank you for taking time to come for your Medicare Wellness Visit. I appreciate your ongoing commitment to your health goals. Please review the following plan we discussed and let me know if I can assist you in the future.   Screening recommendations/referrals: Colonoscopy excluded, you are over age 81 Mammogram excluded, you are over age 71 Bone Density up to date Recommended yearly ophthalmology/optometry visit for glaucoma screening and checkup Recommended yearly dental visit for hygiene and checkup  Vaccinations: Influenza vaccine due, please get tomorrow when you are in the office Pneumococcal vaccine up to date Tdap vaccine up to date. Due 09/16/2021 Shingles vaccine due, prescription sent to pharmacy    Advanced directives: In Chart  Conditions/risks identified: None  Next appointment: Dr. Mariea Clonts 07/11/2017 @ 7:30am             Tyson Dense, RN   Preventive Care 27 Years and Older, Female Preventive care refers to lifestyle choices and visits with your health care provider that can promote health and wellness. What does preventive care include?  A yearly physical exam. This is also called an annual well check.  Dental exams once or twice a year.  Routine eye exams. Ask your health care provider how often you should have your eyes checked.  Personal lifestyle choices, including:  Daily care of your teeth and gums.  Regular physical activity.  Eating a healthy diet.  Avoiding tobacco and drug use.  Limiting alcohol use.  Practicing safe sex.  Taking low-dose aspirin every day.  Taking vitamin and mineral supplements as recommended by your health care provider. What happens during an annual well check? The services and screenings done by your health care provider during your annual well check will depend on your age, overall health, lifestyle risk factors, and family history of disease. Counseling  Your health care provider may ask you questions  about your:  Alcohol use.  Tobacco use.  Drug use.  Emotional well-being.  Home and relationship well-being.  Sexual activity.  Eating habits.  History of falls.  Memory and ability to understand (cognition).  Work and work Statistician.  Reproductive health. Screening  You may have the following tests or measurements:  Height, weight, and BMI.  Blood pressure.  Lipid and cholesterol levels. These may be checked every 5 years, or more frequently if you are over 83 years old.  Skin check.  Lung cancer screening. You may have this screening every year starting at age 39 if you have a 30-pack-year history of smoking and currently smoke or have quit within the past 15 years.  Fecal occult blood test (FOBT) of the stool. You may have this test every year starting at age 71.  Flexible sigmoidoscopy or colonoscopy. You may have a sigmoidoscopy every 5 years or a colonoscopy every 10 years starting at age 58.  Hepatitis C blood test.  Hepatitis B blood test.  Sexually transmitted disease (STD) testing.  Diabetes screening. This is done by checking your blood sugar (glucose) after you have not eaten for a while (fasting). You may have this done every 1-3 years.  Bone density scan. This is done to screen for osteoporosis. You may have this done starting at age 42.  Mammogram. This may be done every 1-2 years. Talk to your health care provider about how often you should have regular mammograms. Talk with your health care provider about your test results, treatment options, and if necessary, the need for more tests. Vaccines  Your health  care provider may recommend certain vaccines, such as:  Influenza vaccine. This is recommended every year.  Tetanus, diphtheria, and acellular pertussis (Tdap, Td) vaccine. You may need a Td booster every 10 years.  Zoster vaccine. You may need this after age 65.  Pneumococcal 13-valent conjugate (PCV13) vaccine. One dose is  recommended after age 71.  Pneumococcal polysaccharide (PPSV23) vaccine. One dose is recommended after age 17. Talk to your health care provider about which screenings and vaccines you need and how often you need them. This information is not intended to replace advice given to you by your health care provider. Make sure you discuss any questions you have with your health care provider. Document Released: 08/05/2015 Document Revised: 03/28/2016 Document Reviewed: 05/10/2015 Elsevier Interactive Patient Education  2017 [place] Prevention in the Home Falls can cause injuries. They can happen to people of all ages. There are many things you can do to make your home safe and to help prevent falls. What can I do on the outside of my home?  Regularly fix the edges of walkways and driveways and fix any cracks.  Remove anything that might make you trip as you walk through a door, such as a raised step or threshold.  Trim any bushes or trees on the path to your home.  Use bright outdoor lighting.  Clear any walking paths of anything that might make someone trip, such as rocks or tools.  Regularly check to see if handrails are loose or broken. Make sure that both sides of any steps have handrails.  Any raised decks and porches should have guardrails on the edges.  Have any leaves, snow, or ice cleared regularly.  Use sand or salt on walking paths during winter.  Clean up any spills in your garage right away. This includes oil or grease spills. What can I do in the bathroom?  Use night lights.  Install grab bars by the toilet and in the tub and shower. Do not use towel bars as grab bars.  Use non-skid mats or decals in the tub or shower.  If you need to sit down in the shower, use a plastic, non-slip stool.  Keep the floor dry. Clean up any water that spills on the floor as soon as it happens.  Remove soap buildup in the tub or shower regularly.  Attach bath mats  securely with double-sided non-slip rug tape.  Do not have throw rugs and other things on the floor that can make you trip. What can I do in the bedroom?  Use night lights.  Make sure that you have a light by your bed that is easy to reach.  Do not use any sheets or blankets that are too big for your bed. They should not hang down onto the floor.  Have a firm chair that has side arms. You can use this for support while you get dressed.  Do not have throw rugs and other things on the floor that can make you trip. What can I do in the kitchen?  Clean up any spills right away.  Avoid walking on wet floors.  Keep items that you use a lot in easy-to-reach places.  If you need to reach something above you, use a strong step stool that has a grab bar.  Keep electrical cords out of the way.  Do not use floor polish or wax that makes floors slippery. If you must use wax, use non-skid floor wax.  Do not have  throw rugs and other things on the floor that can make you trip. What can I do with my stairs?  Do not leave any items on the stairs.  Make sure that there are handrails on both sides of the stairs and use them. Fix handrails that are broken or loose. Make sure that handrails are as long as the stairways.  Check any carpeting to make sure that it is firmly attached to the stairs. Fix any carpet that is loose or worn.  Avoid having throw rugs at the top or bottom of the stairs. If you do have throw rugs, attach them to the floor with carpet tape.  Make sure that you have a light switch at the top of the stairs and the bottom of the stairs. If you do not have them, ask someone to add them for you. What else can I do to help prevent falls?  Wear shoes that:  Do not have high heels.  Have rubber bottoms.  Are comfortable and fit you well.  Are closed at the toe. Do not wear sandals.  If you use a stepladder:  Make sure that it is fully opened. Do not climb a closed  stepladder.  Make sure that both sides of the stepladder are locked into place.  Ask someone to hold it for you, if possible.  Clearly mark and make sure that you can see:  Any grab bars or handrails.  First and last steps.  Where the edge of each step is.  Use tools that help you move around (mobility aids) if they are needed. These include:  Canes.  Walkers.  Scooters.  Crutches.  Turn on the lights when you go into a dark area. Replace any light bulbs as soon as they burn out.  Set up your furniture so you have a clear path. Avoid moving your furniture around.  If any of your floors are uneven, fix them.  If there are any pets around you, be aware of where they are.  Review your medicines with your doctor. Some medicines can make you feel dizzy. This can increase your chance of falling. Ask your doctor what other things that you can do to help prevent falls. This information is not intended to replace advice given to you by your health care provider. Make sure you discuss any questions you have with your health care provider. Document Released: 05/05/2009 Document Revised: 12/15/2015 Document Reviewed: 08/13/2014 Elsevier Interactive Patient Education  2017 [employer].

## 2017-07-10 NOTE — Progress Notes (Signed)
Subjective:   Nicole [name] is a 83 y.o. female who presents for Medicare Annual (Subsequent) preventive examination.  Last AWV-05/24/2016       Objective:     Vitals: BP 132/64 (BP Location: Left Arm, Patient Position: Sitting)   Pulse 73   Temp (!) 96.3 F (35.7 C) (Axillary)   Ht 5\' 8"  (1.727 m)   Wt 150 lb (68 kg)   SpO2 96%   BMI 22.81 kg/m   Body mass index is 22.81 kg/m.  Advanced Directives 07/10/2017 05/28/2017 05/28/2017 05/23/2017 05/23/2017 05/21/2017 03/04/2017  Does Patient Have a Medical Advance Directive? Yes - Yes Yes No No -  Type of Paramedic of [place];Living will Out of facility DNR (pink MOST or yellow form) Out of facility DNR (pink MOST or yellow form) Living will - - Living will  Does patient want to make changes to medical advance directive? No - Patient declined Yes (Inpatient - patient requests chaplain consult to change a medical advance directive) No - Patient declined No - Patient declined - - -  Copy of [place] in Chart? Yes - - - - - -  Would patient like information on creating a medical advance directive? - Yes (ED - Information included in AVS) - - Yes (ED - Information included in AVS) Yes (ED - Information included in AVS) -  Pre-existing out of facility DNR order (yellow form or pink MOST form) - - - - - - -    Tobacco Social History   Tobacco Use  Smoking Status Never Smoker  Smokeless Tobacco Never Used     Counseling given: Not Answered   Clinical Intake:  Pre-visit preparation completed: No  Pain : No/denies pain     Nutritional Status: BMI of 19-24  Normal Nutritional Risks: None  How often do you need to have someone help you when you read instructions, pamphlets, or other written materials from your doctor or pharmacy?: 1 - Never What is the last grade level you completed in school?: Some College  Interpreter Needed?: No  Information entered by :: Theodoro Doing,  RN  Past Medical History:  Diagnosis Date  . AAA (abdominal aortic aneurysm) ([place])   . Anemia   . Anxiety   . Arthritis   . Atrial fibrillation ([place])   . Cervicalgia   . Cholelithiasis   . Chronic systolic heart failure ([place])   . CKD (chronic kidney disease)   . Colon cancer ([place]) 07/1997  . Depression   . Diabetes mellitus ([place])   . Dizziness and giddiness   . Electrolyte and fluid disorders not elsewhere classified   . GERD (gastroesophageal reflux disease)   . Gout, unspecified   . Hemorrhage of rectum and anus   . Hypercholesteremia   . Hypertension   . Hypoglycemia, unspecified   . Hypopotassemia   . Hypothyroidism   . Kidney stone    renal calculi  . Other chronic allergic conjunctivitis   . Other specified cardiac dysrhythmias(427.89)   . Pain in joint, lower leg   . Perforation of bile duct   . Proteinuria   . Shortness of breath   . Small bowel obstruction due to adhesions ([place]) 05/16/2012  . Stroke ([place])   . Swelling, mass, or lump in head and neck   . Thoracic aneurysm without mention of rupture    4.6 cm Asc Aortic aneurysm, CT 07/2015   Past Surgical History:  Procedure Laterality Date  . CARDIAC  CATHETERIZATION  2008   moderate severe pulm HTN; with elevted pulm capillary wedge pressure   . CHOLECYSTECTOMY  05/14/2012   Procedure: LAPAROSCOPIC CHOLECYSTECTOMY WITH INTRAOPERATIVE CHOLANGIOGRAM;  Surgeon: Haywood Lasso, MD;  Location: [facility];  Service: General;  Laterality: N/A;  . ERCP  05/17/2012   Procedure: ENDOSCOPIC RETROGRADE CHOLANGIOPANCREATOGRAPHY (ERCP);  Surgeon: Missy Sabins, MD;  Location: [facility];  Service: Gastroenterology;  Laterality: N/A;  . ERCP  08/12/2012   Procedure: ENDOSCOPIC RETROGRADE CHOLANGIOPANCREATOGRAPHY (ERCP);  Surgeon: Missy Sabins, MD;  [facility] ENDOSCOPY;  Service: Endoscopy;  Laterality: N/A;  . HEMICOLECTOMY  08/11/1997  . KIDNEY STONE SURGERY    . TRANSTHORACIC ECHOCARDIOGRAM  11/2009   EF 45-50%, mild-mod AV  regurg; mild MR; mod TR; ascending aorta mildly dilated   Family History  Problem Relation Age of Onset  . Heart disease Mother   . Stroke Father   . Hypertension Daughter   . Hypertension Son   . Diabetes Sister   . Hypertension Son    Social History   Socioeconomic History  . Marital status: Widowed    Spouse name: Not on file  . Number of children: Not on file  . Years of education: Not on file  . Highest education level: Not on file  Social Needs  . Financial resource strain: Not hard at all  . Food insecurity - worry: Never true  . Food insecurity - inability: Never true  . Transportation needs - medical: No  . Transportation needs - non-medical: No  Occupational History  . Not on file  Tobacco Use  . Smoking status: Never Smoker  . Smokeless tobacco: Never Used  Substance and Sexual Activity  . Alcohol use: No  . Drug use: No  . Sexual activity: No  Other Topics Concern  . Not on file  Social History Narrative  . Not on file    Outpatient Encounter Medications as of 07/10/2017  Medication Sig  . allopurinol (ZYLOPRIM) 100 MG tablet TAKE 1 TABLET BY MOUTH  DAILY (Patient taking differently: TAKE 100MG  BY MOUTH ONCE DAILY)  . donepezil (ARICEPT) 10 MG tablet Take one tablet by mouth once daily at bedtime  . ferrous sulfate 325 (65 FE) MG EC tablet Take 1 tablet (325 mg total) by mouth 2 (two) times daily with a meal.  . furosemide (LASIX) 20 MG tablet Take 40 mg by mouth daily as needed for fluid. for weight gain greater than 3 pounds  . hydrALAZINE (APRESOLINE) 25 MG tablet Take 75 mg by mouth 3 (three) times daily. Take 3 tablets to = 75 mg TID  . isosorbide mononitrate (IMDUR) 30 MG 24 hr tablet Take 30 mg by mouth 2 (two) times daily.  Marland Kitchen labetalol (NORMODYNE) 300 MG tablet Take 1 tablet (300 mg total) by mouth 2 (two) times daily.  Marland Kitchen levothyroxine (SYNTHROID, LEVOTHROID) 25 MCG tablet Take 25 mcg by mouth daily before breakfast.  . NUTRITIONAL SUPPLEMENT LIQD  Take 120 mLs by mouth 2 (two) times daily.  . OXYGEN Inhale 2 L into the lungs as needed.  . potassium chloride (K-DUR) 10 MEQ tablet Take 2 tablets (20 mEq total) by mouth daily.  . sertraline (ZOLOFT) 50 MG tablet Take 1 tablet (50 mg total) by mouth daily.  Marland Kitchen Zoster Vaccine Adjuvanted [facility]) injection Inject 0.5 mLs into the muscle once for 1 dose.  . [DISCONTINUED] Zoster Vaccine Adjuvanted [facility]) injection Inject 0.5 mLs into the muscle once.  Marland Kitchen ipratropium-albuterol (DUONEB) 0.5-2.5 (3) MG/3ML  SOLN Take 3 mLs by nebulization every 6 (six) hours as needed (SOB/wheezing).   No facility-administered encounter medications on file as of 07/10/2017.     Activities of Daily Living In your present state of health, do you have any difficulty performing the following activities: 07/10/2017 05/28/2017  Hearing? N N  Vision? N N  Difficulty concentrating or making decisions? [name]  Walking or climbing stairs? N Y  Dressing or bathing? N Y  Doing errands, shopping? N Y  Conservation officer, nature and eating ? N -  Using the Toilet? N -  In the past six months, have you accidently leaked urine? Y -  Do you have problems with loss of bowel control? N -  Managing your Medications? Y -  Managing your Finances? Y -  Housekeeping or managing your Housekeeping? Y -  Some recent data might be hidden    Patient Care Team: [name], DO as PCP - General (Geriatric Medicine) Neldon Mc, MD as Consulting Physician (General Surgery) Debara Pickett Nadean Corwin, MD as Consulting Physician (Cardiology) Maia Breslow, MD as Consulting Physician (Orthopedic Surgery) Rutherford Guys, MD as Consulting Physician (Ophthalmology) Greg Cutter, LCSW as [place] Management (Licensed Clinical Social Worker) Ruedinger, Drexel Iha, [place] (Pharmacist) Valente David, RN as [place] Management    Assessment:   This is a routine wellness examination for [place].  Exercise  Activities and Dietary recommendations Current Exercise Habits: The patient does not participate in regular exercise at present, Exercise limited by: respiratory conditions(s)  Goals    None      Fall Risk Fall Risk  07/10/2017 05/28/2017 03/04/2017 01/31/2017 11/08/2016  Falls in the past year? No No No No No  Number falls in past yr: - - - - -  Injury with Fall? - - - - -  Comment - - - - -   Is the patient's home free of loose throw rugs in walkways, pet beds, electrical cords, etc?   yes      Grab bars in the bathroom? yes      Handrails on the stairs?   yes      Adequate lighting?   yes  Timed Get Up and Go performed: 24 seconds, fall risk  Depression Screen PHQ 2/9 Scores 07/10/2017 05/28/2017 11/08/2016 05/24/2016  PHQ - 2 Score 2 3 0 1  PHQ- 9 Score 5 7 - -     Cognitive Function MMSE - Mini Mental State Exam 07/10/2017 05/24/2016 06/09/2015 04/23/2013  Not completed: - - (No Data) -  Orientation to time 0 0 0 5  Orientation to Place 3 4 0 5  Registration 3 3 3 3   Attention/ Calculation 5 4 5 5   Recall 0 3 0 2  Language- name 2 objects 2 2 2 2   Language- repeat 1 1 1 1   Language- follow 3 step command 3 2 3 3   Language- read & follow direction 1 0 1 1  Write a sentence 1 1 1 1   Copy design 0 0 0 1  Total score 19 20 16 29         Immunization History  Administered Date(s) Administered  . Influenza Split 05/14/2012  . Influenza,inj,Quad PF,6+ Mos 04/23/2013, 04/05/2015, 05/02/2015, 05/24/2016  . PPD Test 05/02/2015  . Pneumococcal Conjugate-13 10/22/2014  . Pneumococcal Polysaccharide-23 09/17/2011  . Tdap 09/17/2011    Qualifies for Shingles Vaccine?yes, educated and prescription sent to pharmacy  Screening Tests Health Maintenance  Topic Date Due  .  OPHTHALMOLOGY EXAM  03/22/1941  . INFLUENZA VACCINE  02/20/2017  . FOOT EXAM  05/24/2017  . HEMOGLOBIN A1C  07/19/2017  . URINE MICROALBUMIN  07/30/2017  . MAMMOGRAM  10/18/2017  . TETANUS/TDAP   09/16/2021  . DEXA SCAN  Completed  . PNA vac Low Risk Adult  Completed    Cancer Screenings: Lung: Low Dose CT Chest recommended if Age 31-80 years, 30 pack-year currently smoking OR have quit w/in 15years. Patient does not qualify. Breast:  Up to date on Mammogram? Yes   Up to date of Bone Density/Dexa? Yes Colorectal: up to date  Additional Screenings:  Hepatitis B/HIV/Syphillis:Not indicated Hepatitis C Screening: Not indicated     Plan:    I have personally reviewed and addressed the Medicare Annual Wellness questionnaire and have noted the following in the patient's chart:  A. Medical and social history B. Use of alcohol, tobacco or illicit drugs  C. Current medications and supplements D. Functional ability and status E.  Nutritional status F.  Physical activity G. Advance directives H. List of other physicians I.  Hospitalizations, surgeries, and ER visits in previous 12 months J.  [place] to include hearing, vision, cognitive, depression L. Referrals and appointments - none  In addition, I have reviewed and discussed with patient certain preventive protocols, quality metrics, and best practice recommendations. A written personalized care plan for preventive services as well as general preventive health recommendations were provided to patient.  See attached scanned questionnaire for additional information.   Signed,   Tyson Dense, RN Nurse Health Advisor   Quick Notes   Health Maintenance: Requested to get flu vaccine tomorrow. Shingrix prescription sent to pharmacy. Eye doctor referral put in. Foot exam due     Abnormal Screen: MMSE 19/30. Did no pass clock drawing     Patient Concerns: PHQ-9-5     Nurse Concerns: None

## 2017-07-11 ENCOUNTER — Encounter: Payer: Self-pay | Admitting: Internal Medicine

## 2017-07-11 ENCOUNTER — Other Ambulatory Visit: Payer: Self-pay | Admitting: Licensed Clinical Social Worker

## 2017-07-11 ENCOUNTER — Ambulatory Visit (INDEPENDENT_AMBULATORY_CARE_PROVIDER_SITE_OTHER): Payer: Medicare Other | Admitting: Internal Medicine

## 2017-07-11 VITALS — BP 142/80 | HR 111 | Temp 97.6°F | Wt 151.0 lb

## 2017-07-11 DIAGNOSIS — I110 Hypertensive heart disease with heart failure: Secondary | ICD-10-CM | POA: Diagnosis not present

## 2017-07-11 DIAGNOSIS — F0150 Vascular dementia without behavioral disturbance: Secondary | ICD-10-CM

## 2017-07-11 DIAGNOSIS — I5030 Unspecified diastolic (congestive) heart failure: Secondary | ICD-10-CM

## 2017-07-11 DIAGNOSIS — I4891 Unspecified atrial fibrillation: Secondary | ICD-10-CM | POA: Diagnosis not present

## 2017-07-11 DIAGNOSIS — I5043 Acute on chronic combined systolic (congestive) and diastolic (congestive) heart failure: Secondary | ICD-10-CM

## 2017-07-11 DIAGNOSIS — R627 Adult failure to thrive: Secondary | ICD-10-CM

## 2017-07-11 DIAGNOSIS — Z23 Encounter for immunization: Secondary | ICD-10-CM

## 2017-07-11 DIAGNOSIS — E034 Atrophy of thyroid (acquired): Secondary | ICD-10-CM

## 2017-07-11 DIAGNOSIS — F332 Major depressive disorder, recurrent severe without psychotic features: Secondary | ICD-10-CM

## 2017-07-11 DIAGNOSIS — E440 Moderate protein-calorie malnutrition: Secondary | ICD-10-CM

## 2017-07-11 MED ORDER — NEBIVOLOL HCL 10 MG PO TABS: 10.0000 mg | ORAL_TABLET | Freq: Once | ORAL | 0 refills | Status: DC

## 2017-07-11 NOTE — Patient Outreach (Signed)
[facility]) Care Management  07/11/2017  Nicole [name] 1930/07/31 [id number]  Assessment-CSW completed outreach attempt today and a female answered the phone. CSW was informed that patient is not available to talk at this time but can take a message. CSW provided contact information and encouraged patient to return call once available to discuss community resources.   Plan-CSW will await return call or complete an additional outreach if needed.  Eula Fried, BSW, MSW, [email or web address] Phone: [phone] Fax: [phone]

## 2017-07-11 NOTE — Progress Notes (Signed)
Location:  [facility] clinic Provider: Quinnten Calvin L. Mariea Clonts, D.O., C.M.D.  Code Status: DNR Goals of Care:  Advanced Directives 07/10/2017  Does Patient Have a Medical Advance Directive? Yes  Type of Paramedic of [place];Living will  Does patient want to make changes to medical advance directive? No - Patient declined  Copy of [place] in Chart? Yes  Would patient like information on creating a medical advance directive? -  Pre-existing out of facility DNR order (yellow form or pink MOST form) -    Chief Complaint  Patient presents with  . Transitions Of Care    follow-up, discharge from [facility]    HPI: Patient is a 83 y.o. female seen today for transitions of care after rehab stay at [facility].    She says she is weak, but she feels ok.  She has lost a bunch of weight.  She is sob at rest, wearing oxygen.    She was hospitalized in Nov from 11/1-11/5 with heart failure and delirium with UTI and baseline vascular dementia.  She then went to rehab at [facility].  Appears she'd also had acute kidney injury.  She required additional diuresis during her rehab stay (under [facility] care there).  She was back to the ED with acute on chronic CHF and hypertensive urgency despite these efforts 3 days later.  Her bp meds were adjusted and she was diuresed again in the ED and d/c'd back to [facility].  She then was discharged on 12/17 from [facility].    Says her spirits are ok.  Says that she could stand a little more, but they're ok.    Says she keeps coming, but is not getting anywhere.  She's weak and can't move like she did.  She says she can't get out of this depression or something else.   Caregiver reports palliative care never came while she was there.   Appetite has improved now from what it was.  Not spending much time in bed now like she did.  She may take one nap.  Then sleeps through the night-goes to bed about 10:30/11 and up at 8am.   She reports never thinking  about dying before.  Has just lost her sons.    Past Medical History:  Diagnosis Date  . AAA (abdominal aortic aneurysm) ([place])   . Anemia   . Anxiety   . Arthritis   . Atrial fibrillation ([place])   . Cervicalgia   . Cholelithiasis   . Chronic systolic heart failure ([place])   . CKD (chronic kidney disease)   . Colon cancer ([place]) 07/1997  . Depression   . Diabetes mellitus ([place])   . Dizziness and giddiness   . Electrolyte and fluid disorders not elsewhere classified   . GERD (gastroesophageal reflux disease)   . Gout, unspecified   . Hemorrhage of rectum and anus   . Hypercholesteremia   . Hypertension   . Hypoglycemia, unspecified   . Hypopotassemia   . Hypothyroidism   . Kidney stone    renal calculi  . Other chronic allergic conjunctivitis   . Other specified cardiac dysrhythmias(427.89)   . Pain in joint, lower leg   . Perforation of bile duct   . Proteinuria   . Shortness of breath   . Small bowel obstruction due to adhesions ([place]) 05/16/2012  . Stroke ([place])   . Swelling, mass, or lump in head and neck   . Thoracic aneurysm without mention of rupture  4.6 cm Asc Aortic aneurysm, CT 07/2015    Past Surgical History:  Procedure Laterality Date  . CARDIAC CATHETERIZATION  2008   moderate severe pulm HTN; with elevted pulm capillary wedge pressure   . CHOLECYSTECTOMY  05/14/2012   Procedure: LAPAROSCOPIC CHOLECYSTECTOMY WITH INTRAOPERATIVE CHOLANGIOGRAM;  Surgeon: Haywood Lasso, MD;  Location: [place];  Service: General;  Laterality: N/A;  . ERCP  05/17/2012   Procedure: ENDOSCOPIC RETROGRADE CHOLANGIOPANCREATOGRAPHY (ERCP);  Surgeon: Missy Sabins, MD;  Location: [place];  Service: Gastroenterology;  Laterality: N/A;  . ERCP  08/12/2012   Procedure: ENDOSCOPIC RETROGRADE CHOLANGIOPANCREATOGRAPHY (ERCP);  Surgeon: Missy Sabins, MD;  [facility] ENDOSCOPY;  Service: Endoscopy;  Laterality: N/A;  . HEMICOLECTOMY  08/11/1997  . KIDNEY STONE SURGERY    . TRANSTHORACIC  ECHOCARDIOGRAM  11/2009   EF 45-50%, mild-mod AV regurg; mild MR; mod TR; ascending aorta mildly dilated    Allergies  Allergen Reactions  . Iohexol Nausea And Vomiting  . Iodinated Diagnostic Agents Nausea And Vomiting  . Z-Pak [Azithromycin] Other (See Comments)    Reaction unknown by family    Outpatient Encounter Medications as of 07/11/2017  Medication Sig  . allopurinol (ZYLOPRIM) 100 MG tablet TAKE 1 TABLET BY MOUTH  DAILY  . donepezil (ARICEPT) 10 MG tablet Take one tablet by mouth once daily at bedtime  . ferrous sulfate 325 (65 FE) MG EC tablet Take 1 tablet (325 mg total) by mouth 2 (two) times daily with a meal.  . furosemide (LASIX) 20 MG tablet Take 40 mg by mouth daily as needed for fluid. for weight gain greater than 3 pounds  . hydrALAZINE (APRESOLINE) 25 MG tablet Take 75 mg by mouth 3 (three) times daily. Take 3 tablets to = 75 mg TID  . ipratropium-albuterol (DUONEB) 0.5-2.5 (3) MG/3ML SOLN Take 3 mLs by nebulization every 6 (six) hours as needed (SOB/wheezing).  . isosorbide mononitrate (IMDUR) 30 MG 24 hr tablet Take 30 mg by mouth 2 (two) times daily.  Marland Kitchen labetalol (NORMODYNE) 300 MG tablet Take 1 tablet (300 mg total) by mouth 2 (two) times daily.  Marland Kitchen levothyroxine (SYNTHROID, LEVOTHROID) 25 MCG tablet Take 25 mcg by mouth daily before breakfast.  . NUTRITIONAL SUPPLEMENT LIQD Take 120 mLs by mouth 2 (two) times daily.  . OXYGEN Inhale 2 L into the lungs as needed.  . potassium chloride (K-DUR) 10 MEQ tablet Take 2 tablets (20 mEq total) by mouth daily.  . sertraline (ZOLOFT) 50 MG tablet Take 1 tablet (50 mg total) by mouth daily.   No facility-administered encounter medications on file as of 07/11/2017.     Review of Systems:  Review of Systems  Constitutional: Positive for malaise/fatigue and weight loss. Negative for chills and fever.  HENT: Positive for hearing loss.   Eyes: Negative for blurred vision.       Glasses  Respiratory: Positive for shortness  of breath. Negative for cough and wheezing.   Cardiovascular: Positive for leg swelling. Negative for chest pain and palpitations.  Gastrointestinal: Negative for abdominal pain, blood in stool, constipation and melena.  Genitourinary: Negative for dysuria.  Musculoskeletal: Negative for falls.  Skin: Negative for itching and rash.  Neurological: Positive for weakness. Negative for dizziness and loss of consciousness.  Endo/Heme/Allergies: Bruises/bleeds easily.  Psychiatric/Behavioral: Positive for depression and memory loss. The patient is not nervous/anxious and does not have insomnia.     Health Maintenance  Topic Date Due  . OPHTHALMOLOGY EXAM  03/22/1941  . INFLUENZA  VACCINE  02/20/2017  . FOOT EXAM  05/24/2017  . HEMOGLOBIN A1C  07/19/2017  . URINE MICROALBUMIN  07/30/2017  . MAMMOGRAM  10/18/2017  . TETANUS/TDAP  09/16/2021  . DEXA SCAN  Completed  . PNA vac Low Risk Adult  Completed    Physical Exam: Vitals:   07/11/17 0733  BP: (!) 160/80  Pulse: (!) 101  Temp: 97.6 F (36.4 C)  TempSrc: Oral  SpO2: 95%  Weight: 151 lb (68.5 kg)   Body mass index is 22.96 kg/m. Physical Exam  Constitutional: No distress.  Weight is stable  HENT:  Head: Normocephalic and atraumatic.  Neck: JVD present.  Cardiovascular: Normal rate, regular rhythm, normal heart sounds and intact distal pulses.  Pulmonary/Chest: Effort normal. She has rales.  Appears dyspneic  Abdominal: Soft. Bowel sounds are normal.  Musculoskeletal: Normal range of motion.  Neurological: She is alert. No cranial nerve deficit.  Answering questions appropriately  Skin: Skin is warm and dry.  Dry mucous membranes, skin tenting present  Psychiatric: She has a normal mood and affect.    Labs reviewed: Basic Metabolic Panel: Recent Labs    01/11/17 2123 01/12/17 0700  05/23/17 0517  05/27/17 0549 06/03/17 06/26/17 1254 06/29/17 1204  NA  --  138   < >  --    < > 141 143 142 138  K  --  3.4*   < >   --    < > 3.8 3.8 3.5 3.3*  CL  --  103   < >  --    < > 113*  --  97 97*  CO2  --  23   < >  --    < > 20*  --  31* 33*  GLUCOSE  --  136*   < >  --    < > 116*  --  82 108*  BUN  --  12   < >  --    < > 15 14 11 8   CREATININE  --  1.07*   < >  --    < > 1.10* 1.1 0.99 1.12*  CALCIUM  --  8.7*   < >  --    < > 8.4*  --  8.3* 8.5*  MG  --  1.6*  --   --   --   --   --   --   --   TSH 0.244*  --   --  0.240*  --   --   --   --   --    < > = values in this interval not displayed.   Liver Function Tests: Recent Labs    07/20/16 1903 11/07/16 0856  AST 17 17  ALT 13* 10  ALKPHOS 79 107  BILITOT 0.4 1.0  PROT 7.2 7.6  ALBUMIN 3.3* 3.6   Recent Labs    07/20/16 1903  LIPASE 54*   Recent Labs    01/11/17 2123  AMMONIA 24   CBC: Recent Labs    02/12/17 1053  05/23/17 0156 05/24/17 0347 05/25/17 0711 06/29/17 1204  WBC 6.2   < > 7.5 7.1 6.6 5.9  NEUTROABS 4,650  --  6.2  --   --  4.4  HGB 10.4*   < > 13.0 10.6* 11.0* 12.2  HCT 33.6*   < > 37.5 31.0* 32.3* 35.7*  MCV 75.2*   < > 74.0* 73.8* 75.1* 76.0*  PLT 326   < > 231 180 171  218   < > = values in this interval not displayed.   Lipid Panel: Recent Labs    11/07/16 0856  CHOL 174  HDL 47*  LDLCALC 109*  TRIG 89  CHOLHDL 3.7   Lab Results  Component Value Date   HGBA1C 6.8 (H) 01/17/2017    Procedures since last visit: Dg Chest 2 View  Result Date: 06/29/2017 CLINICAL DATA:  83 year old female with shortness of breath. EXAM: CHEST  2 VIEW COMPARISON:  05/23/2017 FINDINGS: Cardiomediastinal silhouette remains enlarged. There is pulmonary vascular congestion and a likely component of mild interstitial edema. Enlarging bibasilar pleural effusions, right greater than left with associated atelectasis. Superimposed consolidation not excluded. No pneumothorax. No acute osseous abnormalities. IMPRESSION: Worsening signs of CHF with cardiomegaly, edema and enlarging bibasilar pleural effusions. Superimposed  consolidation difficult to entirely exclude. Electronically Signed   By: Kristopher Oppenheim M.D.   On: 06/29/2017 13:00   Ct Head Wo Contrast  Result Date: 06/29/2017 CLINICAL DATA:  Headache and dizziness since this morning. EXAM: CT HEAD WITHOUT CONTRAST TECHNIQUE: Contiguous axial images were obtained from the base of the skull through the vertex without intravenous contrast. COMPARISON:  Brain MRI, brain MRI and head CT, 05/23/2017. FINDINGS: Brain: No evidence of acute infarction, hemorrhage, hydrocephalus, extra-axial collection or mass lesion/mass effect. There is ventricular sulcal enlargement reflecting age appropriate volume loss. Patchy white matter hypoattenuation is noted consistent with moderate chronic microvascular ischemic change. Vascular: No hyperdense vessel or unexpected calcification. Skull: Normal. Negative for fracture or focal lesion. Sinuses/Orbits: Globes and orbits are unremarkable. Mild dependent mucosal thickening in the left sphenoid sinus. Visualized sinuses otherwise clear. Clear mastoid air cells. Other: None. IMPRESSION: 1. No acute intracranial abnormalities. 2. Age related volume loss. Moderate chronic microvascular ischemic change. Electronically Signed   By: Lajean Manes M.D.   On: 06/29/2017 13:27    Assessment/Plan 1. Atrial fibrillation with RVR (HCC) -HR down to 86 after bystolic, bp improved also -cont to monitor - Basic metabolic panel - Brain Natriuretic Peptide - nebivolol (BYSTOLIC) 10 MG tablet; Take 1 tablet (10 mg total) by mouth once for 1 dose.  Dispense: 1 tablet; Refill: 0 - Do not attempt resuscitation (DNR)  2. Acute on chronic combined systolic and diastolic heart failure ([place]) - has just been hospitalized and in rehab after this - mixed volume picture today very hard to assess--in some ways appears "wet", others "dry" - obtained labs - Basic metabolic panel - Brain Natriuretic Peptide - nebivolol (BYSTOLIC) 10 MG tablet; Take 1 tablet (10  mg total) by mouth once for 1 dose.  Dispense: 1 tablet; Refill: 0 - Do not attempt resuscitation (DNR) -advised f/u with CHF clinic -also discussed and recommended hospice care -daughter not present at visit and I need to reach out to her  3. Accelerated hypertension with diastolic congestive heart failure, NYHA class 3 (HCC) - bp elevated above goal in 160s initially but improved with bystolic 10mg  once - Do not attempt resuscitation (DNR)  4. Hypothyroidism due to acquired atrophy of thyroid -cont current levothyroxine therapy and f/u tsh next time - Do not attempt resuscitation (DNR)  5. Adult failure to thrive -ongoing, due to multimorbidity, frailty -having trajectory of decline with her chf especially -would benefit from hospice care for improved QOL -plan to call her daughter and discuss hospice option (difficult at this time after pt's 2 sons recently died) - Do not attempt resuscitation (DNR)  6. Severe episode of recurrent major depressive disorder, without psychotic  features ([place]) -ongoing but improved--is now functioning some at home instead of sleeping all day, is eating better -cont zoloft 50mg , but might increase at her next visit if still tearful at times - Do not attempt resuscitation (DNR)  7. Vascular dementia without behavioral disturbance - from prior strokes and multimorbidity, on aricept - Do not attempt resuscitation (DNR)  9. Need for immunization against influenza - Flu vaccine HIGH DOSE PF (Fluzone High dose)   Labs/tests ordered:   Orders Placed This Encounter  Procedures  . Flu vaccine HIGH DOSE PF (Fluzone High dose)  . Basic metabolic panel    Order Specific Question:   Has the patient fasted?    Answer:   Yes  . Brain Natriuretic Peptide  . Do not attempt resuscitation (DNR)    Discussed at clinic visit, scanned copy should be in documents and media    Order Specific Question:   In the event of cardiac or respiratory ARREST    Answer:   Do  not call a "code blue"    Order Specific Question:   In the event of cardiac or respiratory ARREST    Answer:   Do not perform Intubation, CPR, defibrillation or ACLS    Order Specific Question:   In the event of cardiac or respiratory ARREST    Answer:   Use medication by any route, position, wound care, and other measures to relive pain and suffering. May use oxygen, suction and manual treatment of airway obstruction as needed for comfort.   Next appt:  2 wks with Janett Billow for f/u on chf, afib, depression  Rilyn Scroggs L. Fidencia Mccloud, D.O. [facility] [address], [place] [address] Cell Phone (Mon-Fri 8am-5pm):  [phone] On Call:  [phone] & follow prompts after 5pm & weekends Office Phone:  [phone] Office Fax:  [phone]

## 2017-07-12 ENCOUNTER — Other Ambulatory Visit: Payer: Self-pay | Admitting: Pharmacist

## 2017-07-12 DIAGNOSIS — N183 Chronic kidney disease, stage 3 (moderate): Secondary | ICD-10-CM | POA: Diagnosis not present

## 2017-07-12 DIAGNOSIS — E1122 Type 2 diabetes mellitus with diabetic chronic kidney disease: Secondary | ICD-10-CM | POA: Diagnosis not present

## 2017-07-12 DIAGNOSIS — R278 Other lack of coordination: Secondary | ICD-10-CM | POA: Diagnosis not present

## 2017-07-12 DIAGNOSIS — Z85038 Personal history of other malignant neoplasm of large intestine: Secondary | ICD-10-CM | POA: Diagnosis not present

## 2017-07-12 DIAGNOSIS — I5022 Chronic systolic (congestive) heart failure: Secondary | ICD-10-CM | POA: Diagnosis not present

## 2017-07-12 DIAGNOSIS — I1310 Hypertensive heart and chronic kidney disease without heart failure, with stage 1 through stage 4 chronic kidney disease, or unspecified chronic kidney disease: Secondary | ICD-10-CM | POA: Diagnosis not present

## 2017-07-12 DIAGNOSIS — I714 Abdominal aortic aneurysm, without rupture: Secondary | ICD-10-CM | POA: Diagnosis not present

## 2017-07-12 DIAGNOSIS — I482 Chronic atrial fibrillation: Secondary | ICD-10-CM | POA: Diagnosis not present

## 2017-07-12 DIAGNOSIS — M6281 Muscle weakness (generalized): Secondary | ICD-10-CM | POA: Diagnosis not present

## 2017-07-12 LAB — BASIC METABOLIC PANEL
BUN/Creatinine Ratio: 10 (calc) (ref 6–22)
BUN: 16 mg/dL (ref 7–25)
CO2: 30 mmol/L (ref 20–32)
Calcium: 8.5 mg/dL — ABNORMAL LOW (ref 8.6–10.4)
Chloride: 102 mmol/L (ref 98–110)
Creat: 1.6 mg/dL — ABNORMAL HIGH (ref 0.60–0.88)
Glucose, Bld: 84 mg/dL (ref 65–99)
Potassium: 3.4 mmol/L — ABNORMAL LOW (ref 3.5–5.3)
Sodium: 142 mmol/L (ref 135–146)

## 2017-07-12 LAB — BRAIN NATRIURETIC PEPTIDE: Brain Natriuretic Peptide: 554 pg/mL — ABNORMAL HIGH (ref <100)

## 2017-07-12 NOTE — Patient Outreach (Signed)
[place] [facility]) Care Management  07/12/2017  ASHTYNN BERKE 09-09-1930 [id number]  Patient was referred to [place] Pharmacist by [facility] Sanjuan Dame for "family requesting pharmacy involvement to ensure stable transition back home."    Unsuccessful phone outreach---female answered phone and stated patient not available.  No PHI exchanged, HIPAA compliant message left requesting return call.   Plan:  If no return call, second outreach attempt next week.   Karrie Meres, PharmD, [place] [phone]

## 2017-07-13 DIAGNOSIS — N183 Chronic kidney disease, stage 3 (moderate): Secondary | ICD-10-CM | POA: Diagnosis not present

## 2017-07-13 DIAGNOSIS — Z85038 Personal history of other malignant neoplasm of large intestine: Secondary | ICD-10-CM | POA: Diagnosis not present

## 2017-07-13 DIAGNOSIS — R278 Other lack of coordination: Secondary | ICD-10-CM | POA: Diagnosis not present

## 2017-07-13 DIAGNOSIS — I482 Chronic atrial fibrillation: Secondary | ICD-10-CM | POA: Diagnosis not present

## 2017-07-13 DIAGNOSIS — M6281 Muscle weakness (generalized): Secondary | ICD-10-CM | POA: Diagnosis not present

## 2017-07-13 DIAGNOSIS — I714 Abdominal aortic aneurysm, without rupture: Secondary | ICD-10-CM | POA: Diagnosis not present

## 2017-07-13 DIAGNOSIS — E1122 Type 2 diabetes mellitus with diabetic chronic kidney disease: Secondary | ICD-10-CM | POA: Diagnosis not present

## 2017-07-13 DIAGNOSIS — I1310 Hypertensive heart and chronic kidney disease without heart failure, with stage 1 through stage 4 chronic kidney disease, or unspecified chronic kidney disease: Secondary | ICD-10-CM | POA: Diagnosis not present

## 2017-07-13 DIAGNOSIS — I5022 Chronic systolic (congestive) heart failure: Secondary | ICD-10-CM | POA: Diagnosis not present

## 2017-07-15 DIAGNOSIS — F32A Depression, unspecified: Secondary | ICD-10-CM | POA: Insufficient documentation

## 2017-07-15 DIAGNOSIS — F419 Anxiety disorder, unspecified: Secondary | ICD-10-CM | POA: Insufficient documentation

## 2017-07-15 DIAGNOSIS — M1990 Unspecified osteoarthritis, unspecified site: Secondary | ICD-10-CM | POA: Insufficient documentation

## 2017-07-15 DIAGNOSIS — E119 Type 2 diabetes mellitus without complications: Secondary | ICD-10-CM | POA: Insufficient documentation

## 2017-07-15 DIAGNOSIS — I5022 Chronic systolic (congestive) heart failure: Secondary | ICD-10-CM | POA: Insufficient documentation

## 2017-07-15 DIAGNOSIS — H1045 Other chronic allergic conjunctivitis: Secondary | ICD-10-CM | POA: Insufficient documentation

## 2017-07-15 DIAGNOSIS — I714 Abdominal aortic aneurysm, without rupture, unspecified: Secondary | ICD-10-CM | POA: Insufficient documentation

## 2017-07-15 DIAGNOSIS — R0602 Shortness of breath: Secondary | ICD-10-CM | POA: Insufficient documentation

## 2017-07-15 DIAGNOSIS — K625 Hemorrhage of anus and rectum: Secondary | ICD-10-CM | POA: Insufficient documentation

## 2017-07-15 DIAGNOSIS — K219 Gastro-esophageal reflux disease without esophagitis: Secondary | ICD-10-CM | POA: Insufficient documentation

## 2017-07-15 DIAGNOSIS — F329 Major depressive disorder, single episode, unspecified: Secondary | ICD-10-CM | POA: Insufficient documentation

## 2017-07-15 DIAGNOSIS — N200 Calculus of kidney: Secondary | ICD-10-CM | POA: Insufficient documentation

## 2017-07-15 DIAGNOSIS — I4891 Unspecified atrial fibrillation: Secondary | ICD-10-CM | POA: Insufficient documentation

## 2017-07-15 DIAGNOSIS — I10 Essential (primary) hypertension: Secondary | ICD-10-CM | POA: Insufficient documentation

## 2017-07-15 DIAGNOSIS — E7800 Pure hypercholesterolemia, unspecified: Secondary | ICD-10-CM | POA: Insufficient documentation

## 2017-07-15 DIAGNOSIS — K8020 Calculus of gallbladder without cholecystitis without obstruction: Secondary | ICD-10-CM | POA: Insufficient documentation

## 2017-07-15 DIAGNOSIS — K832 Perforation of bile duct: Secondary | ICD-10-CM | POA: Insufficient documentation

## 2017-07-15 DIAGNOSIS — M25569 Pain in unspecified knee: Secondary | ICD-10-CM | POA: Insufficient documentation

## 2017-07-15 DIAGNOSIS — R42 Dizziness and giddiness: Secondary | ICD-10-CM | POA: Insufficient documentation

## 2017-07-15 DIAGNOSIS — E039 Hypothyroidism, unspecified: Secondary | ICD-10-CM | POA: Insufficient documentation

## 2017-07-15 DIAGNOSIS — M542 Cervicalgia: Secondary | ICD-10-CM | POA: Insufficient documentation

## 2017-07-15 DIAGNOSIS — E876 Hypokalemia: Secondary | ICD-10-CM | POA: Insufficient documentation

## 2017-07-15 DIAGNOSIS — N189 Chronic kidney disease, unspecified: Secondary | ICD-10-CM | POA: Insufficient documentation

## 2017-07-15 DIAGNOSIS — M109 Gout, unspecified: Secondary | ICD-10-CM | POA: Insufficient documentation

## 2017-07-15 DIAGNOSIS — R809 Proteinuria, unspecified: Secondary | ICD-10-CM | POA: Insufficient documentation

## 2017-07-15 DIAGNOSIS — E162 Hypoglycemia, unspecified: Secondary | ICD-10-CM | POA: Insufficient documentation

## 2017-07-15 DIAGNOSIS — I639 Cerebral infarction, unspecified: Secondary | ICD-10-CM | POA: Insufficient documentation

## 2017-07-17 ENCOUNTER — Telehealth: Payer: Self-pay | Admitting: *Deleted

## 2017-07-17 ENCOUNTER — Other Ambulatory Visit: Payer: Self-pay | Admitting: Pharmacist

## 2017-07-17 ENCOUNTER — Telehealth: Payer: Self-pay | Admitting: Internal Medicine

## 2017-07-17 DIAGNOSIS — I714 Abdominal aortic aneurysm, without rupture: Secondary | ICD-10-CM | POA: Diagnosis not present

## 2017-07-17 DIAGNOSIS — N183 Chronic kidney disease, stage 3 (moderate): Secondary | ICD-10-CM | POA: Diagnosis not present

## 2017-07-17 DIAGNOSIS — I482 Chronic atrial fibrillation: Secondary | ICD-10-CM | POA: Diagnosis not present

## 2017-07-17 DIAGNOSIS — I5022 Chronic systolic (congestive) heart failure: Secondary | ICD-10-CM | POA: Diagnosis not present

## 2017-07-17 DIAGNOSIS — E1122 Type 2 diabetes mellitus with diabetic chronic kidney disease: Secondary | ICD-10-CM | POA: Diagnosis not present

## 2017-07-17 DIAGNOSIS — Z85038 Personal history of other malignant neoplasm of large intestine: Secondary | ICD-10-CM | POA: Diagnosis not present

## 2017-07-17 DIAGNOSIS — M6281 Muscle weakness (generalized): Secondary | ICD-10-CM | POA: Diagnosis not present

## 2017-07-17 DIAGNOSIS — I1310 Hypertensive heart and chronic kidney disease without heart failure, with stage 1 through stage 4 chronic kidney disease, or unspecified chronic kidney disease: Secondary | ICD-10-CM | POA: Diagnosis not present

## 2017-07-17 DIAGNOSIS — R278 Other lack of coordination: Secondary | ICD-10-CM | POA: Diagnosis not present

## 2017-07-17 NOTE — Telephone Encounter (Signed)
We were actually trying to clarify fluid restriction because no one was clear on the amount of fluid she was restricted to or how much she was drinking- it was thought she was not even drinking the amount of the fluid restriction.

## 2017-07-17 NOTE — Telephone Encounter (Signed)
Nicole [name] with [facility] called regarding patient gaining 7lbs since last week. Stated that she has already called the Cardiologist office, Dr. Debara Pickett and awaiting a response but wanted to let you know and stated that you had taken her off of the fluid restriction that the ER had placed patient on. Stated that the patient told her that you encouraged her to be drinking water.  Nurse also confirmed dosage of Furosemide.

## 2017-07-17 NOTE — Telephone Encounter (Signed)
Returned call to [facility] nurse-she states that she spoke with Dr Cyndi Lennert office took off the fluid restriction that pt was on since she got out of the hospital. She states that pt has gained 7# since got out of the hospital. She states that pt is taking lasix 20mg  daily. Nurse states that pt is doing fine denies any SOB and just a few crackles in Bilat upper lobes. She states that O2 sat is 99% on 3 liters., denies any swelling except a little peri-orbital swelling. Informed to have pt continue daily weights and take extra 20mg  x3 days and call back with additional changes. She states to call family to inform. Called [name] [place]) [phone] to inform and she states that she does not know why we are calling and telling her to increase lasix because Dr Cyndi Lennert office already are telling her to take additional and she does not know why PCP and our office doen't know what we are doing. She states that she will call caregiver for update and call us back for update and discussion on directions for lasix. And she disconnected the phone call.  Called to inform [facility] nurse of the phone call to family. She will await call from daughter. She will call with any updates. She will just go with the extra lasix 20mg  daily since she personally checked the "pills box' and states that she knows for sure that pt is only taking 20mg  daily she will call back Friday with update after her visit.  Will await return calls

## 2017-07-17 NOTE — Telephone Encounter (Signed)
Pt c/o swelling: STAT is pt has developed SOB within 24 hours  1) How much weight have you gained and in what time span? 7 pounds since 07/13/2107  2) If swelling, where is the swelling located? legs  3) Are you currently taking a fluid pill? Yes   4) Are you currently SOB?  no  5) Do you have a log of your daily weights (if so, list)?        12/12/   153.2          12/14   150        12/18  148.6          12/21   148.8        12/26    155  6) Have you gained 3 pounds in a day or 5 pounds in a week? 7 pounds in a week 7) Have you traveled recently? no

## 2017-07-17 NOTE — Telephone Encounter (Signed)
GLORIA GRAVES (DAUGHTER) called back to apoligised and states that she will make sure that the extra lasix is taken  Abd that weight is tracked daily. She will call back if anything else is needed and will be here on time for scheduled appt.

## 2017-07-17 NOTE — Patient Outreach (Signed)
[place] [facility]) Care Management  07/17/2017  RYANNA TESCHNER 05-14-1931 [id number]  Second unsuccessful phone outreach to patient. Gentleman answered phone and stated patient not available.    Placed call to [place], regarding difficulty getting in contact with patient, noted [facility] RN not able to reach patient either.  [place] LCSW notes she has been calling patient's home number listed in chart previously.   Plan:  Third phone outreach attempt in the next week.   Karrie Meres, PharmD, [place] [phone]

## 2017-07-18 ENCOUNTER — Other Ambulatory Visit: Payer: Self-pay | Admitting: Pharmacist

## 2017-07-18 DIAGNOSIS — R278 Other lack of coordination: Secondary | ICD-10-CM | POA: Diagnosis not present

## 2017-07-18 DIAGNOSIS — I482 Chronic atrial fibrillation: Secondary | ICD-10-CM | POA: Diagnosis not present

## 2017-07-18 DIAGNOSIS — Z7984 Long term (current) use of oral hypoglycemic drugs: Secondary | ICD-10-CM | POA: Diagnosis not present

## 2017-07-18 DIAGNOSIS — Z85038 Personal history of other malignant neoplasm of large intestine: Secondary | ICD-10-CM | POA: Diagnosis not present

## 2017-07-18 DIAGNOSIS — I5022 Chronic systolic (congestive) heart failure: Secondary | ICD-10-CM | POA: Diagnosis not present

## 2017-07-18 DIAGNOSIS — M6281 Muscle weakness (generalized): Secondary | ICD-10-CM | POA: Diagnosis not present

## 2017-07-18 DIAGNOSIS — E1122 Type 2 diabetes mellitus with diabetic chronic kidney disease: Secondary | ICD-10-CM | POA: Diagnosis not present

## 2017-07-18 DIAGNOSIS — Z961 Presence of intraocular lens: Secondary | ICD-10-CM | POA: Diagnosis not present

## 2017-07-18 DIAGNOSIS — N183 Chronic kidney disease, stage 3 (moderate): Secondary | ICD-10-CM | POA: Diagnosis not present

## 2017-07-18 DIAGNOSIS — I1310 Hypertensive heart and chronic kidney disease without heart failure, with stage 1 through stage 4 chronic kidney disease, or unspecified chronic kidney disease: Secondary | ICD-10-CM | POA: Diagnosis not present

## 2017-07-18 DIAGNOSIS — E119 Type 2 diabetes mellitus without complications: Secondary | ICD-10-CM | POA: Diagnosis not present

## 2017-07-18 DIAGNOSIS — I714 Abdominal aortic aneurysm, without rupture: Secondary | ICD-10-CM | POA: Diagnosis not present

## 2017-07-18 LAB — HM DIABETES EYE EXAM

## 2017-07-18 NOTE — Patient Outreach (Signed)
[place] [facility]) Care Management  07/18/2017  Nicole [name] 1930-12-30 [id number]   Third outreach to patient, gentleman answered call and reports patient wasn't available.  Began to leave HIPAA compliant message, and gentleman said patient was up.  Patient was placed on phone, HIPAA details verified.   Explained purpose of call to patient to discuss medications after she left SNF per referral from [place].  Patient reports she doesn't recall meeting with [facility] LCSW or needing assistance with her medications.  She reports her spouse helps her manage her medications and denies needing assistance.  She declines reviewing medications.    Patient was willing to take [facility] Pharmacist name and phone number should she change her mind later.   Placed call to [facility] LCSW and collaborated on this case.  At this time, [facility] Pharmacist will plan to close case given patient declining service of pharmacy.   Plan:  Pharmacy episode closed, [facility] LCSW aware.   Karrie Meres, PharmD, [place] [phone]

## 2017-07-19 DIAGNOSIS — I5022 Chronic systolic (congestive) heart failure: Secondary | ICD-10-CM | POA: Diagnosis not present

## 2017-07-19 DIAGNOSIS — I714 Abdominal aortic aneurysm, without rupture: Secondary | ICD-10-CM | POA: Diagnosis not present

## 2017-07-19 DIAGNOSIS — I1310 Hypertensive heart and chronic kidney disease without heart failure, with stage 1 through stage 4 chronic kidney disease, or unspecified chronic kidney disease: Secondary | ICD-10-CM | POA: Diagnosis not present

## 2017-07-19 DIAGNOSIS — I482 Chronic atrial fibrillation: Secondary | ICD-10-CM | POA: Diagnosis not present

## 2017-07-19 DIAGNOSIS — Z85038 Personal history of other malignant neoplasm of large intestine: Secondary | ICD-10-CM | POA: Diagnosis not present

## 2017-07-19 DIAGNOSIS — E1122 Type 2 diabetes mellitus with diabetic chronic kidney disease: Secondary | ICD-10-CM | POA: Diagnosis not present

## 2017-07-19 DIAGNOSIS — N183 Chronic kidney disease, stage 3 (moderate): Secondary | ICD-10-CM | POA: Diagnosis not present

## 2017-07-19 DIAGNOSIS — M6281 Muscle weakness (generalized): Secondary | ICD-10-CM | POA: Diagnosis not present

## 2017-07-19 DIAGNOSIS — R278 Other lack of coordination: Secondary | ICD-10-CM | POA: Diagnosis not present

## 2017-07-20 ENCOUNTER — Other Ambulatory Visit: Payer: Self-pay | Admitting: *Deleted

## 2017-07-20 NOTE — Patient Outreach (Signed)
[place] [facility]) Care Management  07/20/2017  Nicole [name] 1931-05-24 [id number]   Unsuccessful outreach letter sent on 12/13, no response.  Will notify LCSW and primary MD of discipline closure.  Will have LCSW close case completely once her plan of care is complete.    Valente David, [place], MSN [place] [phone]

## 2017-07-22 ENCOUNTER — Encounter: Payer: Self-pay | Admitting: Internal Medicine

## 2017-07-22 ENCOUNTER — Ambulatory Visit: Payer: Medicare Other | Admitting: Internal Medicine

## 2017-07-22 VITALS — BP 151/88 | HR 99 | Ht 68.0 in | Wt 158.4 lb

## 2017-07-22 DIAGNOSIS — I5033 Acute on chronic diastolic (congestive) heart failure: Secondary | ICD-10-CM | POA: Diagnosis not present

## 2017-07-22 DIAGNOSIS — I712 Thoracic aortic aneurysm, without rupture, unspecified: Secondary | ICD-10-CM

## 2017-07-22 DIAGNOSIS — I5022 Chronic systolic (congestive) heart failure: Secondary | ICD-10-CM | POA: Diagnosis not present

## 2017-07-22 DIAGNOSIS — I1310 Hypertensive heart and chronic kidney disease without heart failure, with stage 1 through stage 4 chronic kidney disease, or unspecified chronic kidney disease: Secondary | ICD-10-CM | POA: Diagnosis not present

## 2017-07-22 DIAGNOSIS — I4821 Permanent atrial fibrillation: Secondary | ICD-10-CM

## 2017-07-22 DIAGNOSIS — R278 Other lack of coordination: Secondary | ICD-10-CM | POA: Diagnosis not present

## 2017-07-22 DIAGNOSIS — I482 Chronic atrial fibrillation: Secondary | ICD-10-CM | POA: Diagnosis not present

## 2017-07-22 DIAGNOSIS — Z85038 Personal history of other malignant neoplasm of large intestine: Secondary | ICD-10-CM | POA: Diagnosis not present

## 2017-07-22 DIAGNOSIS — N183 Chronic kidney disease, stage 3 (moderate): Secondary | ICD-10-CM | POA: Diagnosis not present

## 2017-07-22 DIAGNOSIS — M6281 Muscle weakness (generalized): Secondary | ICD-10-CM | POA: Diagnosis not present

## 2017-07-22 DIAGNOSIS — I714 Abdominal aortic aneurysm, without rupture: Secondary | ICD-10-CM | POA: Diagnosis not present

## 2017-07-22 DIAGNOSIS — E1122 Type 2 diabetes mellitus with diabetic chronic kidney disease: Secondary | ICD-10-CM | POA: Diagnosis not present

## 2017-07-22 MED ORDER — FUROSEMIDE 40 MG PO TABS: 40.0000 mg | ORAL_TABLET | Freq: Every day | ORAL | 3 refills | Status: DC

## 2017-07-22 NOTE — Progress Notes (Signed)
OFFICE NOTE  Chief Complaint:  No complaints today  Primary Care Physician: Nicole Curry, DO  HPI:  Nicole [name] is a 83 year old female with history of chronic atrial fibrillation and controlled ventricular response. She was previously followed by Dr. Rex Kras, has a history of congestive heart failure with an EF of about 45% and moderate MR in the past. She has a thoracic aneurysm which has been asymptomatic, as well as hypertension, diabetes, dyslipidemia, and recently was admitted for gallbladder surgery which was complicated by bleeding issues. She has been deemed not a good candidate for Coumadin due to a number of issues with compliance by Dr. Rex Kras, and has been on aspirin and Plavix. Unfortunately, with regard to the Plavix she had complicated bleeding postoperatively from her gallbladder surgery and Plavix was discontinued due to this. It turns out that there is little benefit to it additional Plavix on top of aspirin for stroke reduction. As far as her stroke risk is concerned, it is actually fairly high considering she is in chronic atrial fibrillation and does have a history of hypertension and diabetes and is a female with a CHADS2 score of 3, a CHADS VASc score of 3-1/2 or 4.  Based on this, I recommended starting her on Eliquis.   Nicole [name] returns today for followup. Unfortunately she was just in the hospital with epistaxis. This is following a period of time where she developed small bowel obstruction and had a nasogastric tube in place. This could contribute to possible erosions. She was seen by an ear nose and throat specialist to placed a nasal balloon and ultimately packed her nose. This packing was in for a week and was removed and no further bleeding was noted. It was not mentioned what the cause of her bleeding was. She was on adequate. She still has a high CHADSVASC score. We had recommended discontinuing her eloquence due to bleeding. She was also in A. fib with RVR  and I recommended increasing her diltiazem to 240 mg daily but that was not performed. She's had no further epistaxis.  I saw [name] back today in the office. Overall she reports feeling very well. Unfortunate she's recently been having significant falls. She says she's been falling out of bed amongst other things and eventually developed significant bruising and has a large resolving hematoma on her right chin. Fortunately there was no intracranial bleeding. She is maintained on warfarin but is not had a check of her INR since March. Her last check in our office was in December. It's unclear why she felt that she did not need to come back for ongoing monthly INR checks. This of warfarin, but fortunately her INR today is 2.7. She remains in atrial fibrillation with a CHADSVASC score of 4. She also has a history of thoracic aortic aneurysm measuring up to 4.8 cm. This is been stable and recently when she had a neck CT, this is partially visualized and also measured 4.8 cm.   Nicole [name] returns today for hospital follow-up. Unfortunately in September she was hospitalized after acute confusion. Seen in the ER a CT scan indicated an acute intracerebral hemorrhage. This was significant with mass effect. Neurology and neurosurgery were consult it. She was on warfarin however INR was just above 3. This was urgently reversed. Blood pressure however was elevated at over 160/120. It was thought that this bleeding was due to hypertensive emergency. Subsequently she had a long hospitalization and rehabilitation. She was taken off of warfarin  and recently followed up with her neurologist , who suggested she could go on low-dose aspirin. She understands after and explanation by me today that the risk of ischemic stroke is still quite high.  Her CHADSVASC or is at least 6.  We did spend some time discussing an alternative which may be the watchman left atrial appendage occluder device.  12/09/2015  Nicole [name] was seen  back today in follow-up. Unfortunately she's had bleeding on anticoagulation and is at high risk for anticoagulation going forward. I had a discussion with her neurologist Dr. Leonie Man regarding the possibility of the watchman and left atrial appendage occluder device. The concern is that she would have to be on 2 months of anticoagulation which he did not feel that she would tolerate. Therefore she is deemed not an anticoagulation candidate and if she is not a watchman candidate would remain on aspirin but at high risk for stroke. Heart failure seems to be well compensated. Blood pressure is at goal weight is stable.  07/27/2016  Nicole [name] was seen back today in follow-up. Overall she is doing fairly well. She's had close to 10 pound weight gain over the past several months however this may be a weight "rebound". She initially had lost some weight and feels like her appetite has improved somewhat and she seems to be eating better. She denies any worsening shortness of breath, orthopnea or lower extremity edema. Sure he is slightly elevated today. Memory seems to be somewhat worse according to her caregiver.  01/24/2017  Nicole [name] returns for follow-up. Unfortunately she was recently hospitalized with recurrent intracerebral hemorrhage. She has chronic A. fib but is not a candidate for anticoagulation or watchman. She's been on low-dose aspirin however prognosis is poor. She's had dementia related to this and most recently a few days ago her son died. She seems to be very upset with this and anxious. She is asking for some medication to help with this but I deferred to her primary care provider. She does have a history of thoracic aneurysm which is been stable by repeat CT angiogram that was performed during this recent hospitalization of 4.8 cm. Regardless she is not a candidate for surgery. She does have a history of congestive heart failure and EF had improved up to 55-60% by echo in January 2017. Apparently  a palliative care discussion took place and I think it would be very reasonable to involve them in her care going forward.  07/22/2017  Nicole [name] was seen today in follow-up.  She has had a difficult fall.  Apparently both of her sons died.  She has been struggling with some depression and was hospitalized in November.  Subsequently she was readmitted to the emergency department with congestive heart failure.  She required nursing home stay.  She was just seen by her primary care provider and they had a long discussion about palliative and end-of-life issues.  She recently has had some worsening swelling.  A review of the chart shows very consistent recommendations for diuretics.  One point she was on 20 mg twice daily of Lasix, then 20 mg daily and at one point was only on Lasix as needed.  Currently her chart indicates 40 mg as needed.  According to her daughter she gets 20 mg daily which was recently decreased from 40 mg.  Overall she is about 7 pounds weight up from her last office visit in early December.  PMHx:   Past Medical History:  Diagnosis Date  .  AAA (abdominal aortic aneurysm) ([place])   . Anemia   . Anxiety   . Arthritis   . Atrial fibrillation ([place])   . Cervicalgia   . Cholelithiasis   . Chronic systolic heart failure ([place])   . CKD (chronic kidney disease)   . Colon cancer ([place]) 07/1997  . Depression   . Diabetes mellitus ([place])   . Dizziness and giddiness   . Electrolyte and fluid disorders not elsewhere classified   . GERD (gastroesophageal reflux disease)   . Gout, unspecified   . Hemorrhage of rectum and anus   . Hypercholesteremia   . Hypertension   . Hypoglycemia, unspecified   . Hypopotassemia   . Hypothyroidism   . Kidney stone    renal calculi  . Other chronic allergic conjunctivitis   . Other specified cardiac dysrhythmias(427.89)   . Pain in joint, lower leg   . Perforation of bile duct   . Proteinuria   . Shortness of breath   . Small bowel  obstruction due to adhesions ([place]) 05/16/2012  . Stroke ([place])   . Swelling, mass, or lump in head and neck   . Thoracic aneurysm without mention of rupture    4.6 cm Asc Aortic aneurysm, CT 07/2015    Past Surgical History:  Procedure Laterality Date  . CARDIAC CATHETERIZATION  2008   moderate severe pulm HTN; with elevted pulm capillary wedge pressure   . CHOLECYSTECTOMY  05/14/2012   Procedure: LAPAROSCOPIC CHOLECYSTECTOMY WITH INTRAOPERATIVE CHOLANGIOGRAM;  Surgeon: Haywood Lasso, MD;  Location: [facility];  Service: General;  Laterality: N/A;  . ERCP  05/17/2012   Procedure: ENDOSCOPIC RETROGRADE CHOLANGIOPANCREATOGRAPHY (ERCP);  Surgeon: Missy Sabins, MD;  Location: [facility];  Service: Gastroenterology;  Laterality: N/A;  . ERCP  08/12/2012   Procedure: ENDOSCOPIC RETROGRADE CHOLANGIOPANCREATOGRAPHY (ERCP);  Surgeon: Missy Sabins, MD;  [facility] END[facility]COPY;  Service: Endoscopy;  Laterality: N/A;  . HEMICOLECTOMY  08/11/1997  . KIDNEY STONE SURGERY    . TRANSTHORACIC ECHOCARDIOGRAM  11/2009   EF 45-50%, mild-mod AV regurg; mild MR; mod TR; ascending aorta mildly dilated    FAMHx:  Family History  Problem Relation Age of Onset  . Heart disease Mother   . Stroke Father   . Hypertension Daughter   . Hypertension Son   . Diabetes Sister   . Hypertension Son     SOCHx:   reports that  has never smoked. she has never used smokeless tobacco. She reports that she does not drink alcohol or use drugs.  ALLERGIES:  Allergies  Allergen Reactions  . Iohexol Nausea And Vomiting  . Iodinated Diagnostic Agents Nausea And Vomiting  . Z-Pak [Azithromycin] Other (See Comments)    Reaction unknown by family    ROS: Pertinent items noted in HPI and remainder of comprehensive ROS otherwise negative.  HOME MEDS: Current Outpatient Medications  Medication Sig Dispense Refill  . allopurinol (ZYLOPRIM) 100 MG tablet TAKE 1 TABLET BY MOUTH  DAILY 90 tablet 1  . donepezil (ARICEPT) 10 MG  tablet Take one tablet by mouth once daily at bedtime 90 tablet 3  . ferrous sulfate 325 (65 FE) MG EC tablet Take 1 tablet (325 mg total) by mouth 2 (two) times daily with a meal. 60 tablet 3  . furosemide (LASIX) 40 MG tablet Take 1 tablet (40 mg total) by mouth daily. 90 tablet 3  . hydrALAZINE (APRESOLINE) 25 MG tablet Take 75 mg by mouth 3 (three) times daily. Take 3 tablets to =  75 mg TID    . ipratropium-albuterol (DUONEB) 0.5-2.5 (3) MG/3ML SOLN Take 3 mLs by nebulization every 6 (six) hours as needed (SOB/wheezing).    . isosorbide mononitrate (IMDUR) 30 MG 24 hr tablet Take 30 mg by mouth 2 (two) times daily.    Marland Kitchen labetalol (NORMODYNE) 300 MG tablet Take 1 tablet (300 mg total) by mouth 2 (two) times daily. 60 tablet 11  . levothyroxine (SYNTHROID, LEVOTHROID) 25 MCG tablet Take 25 mcg by mouth daily before breakfast.    . NUTRITIONAL SUPPLEMENT LIQD Take 120 mLs by mouth 2 (two) times daily.    . OXYGEN Inhale 2 L into the lungs as needed.    . potassium chloride (K-DUR) 10 MEQ tablet Take 2 tablets (20 mEq total) by mouth daily. 60 tablet 3  . sertraline (ZOLOFT) 50 MG tablet Take 1 tablet (50 mg total) by mouth daily. 30 tablet 2  . nebivolol (BYSTOLIC) 10 MG tablet Take 1 tablet (10 mg total) by mouth once for 1 dose. 1 tablet 0   No current facility-administered medications for this visit.     LABS/IMAGING: No results found for this or any previous visit (from the past 48 hour(s)). No results found.  VITALS: BP (!) 151/88   Pulse 99   Ht 5\' 8"  (1.727 m)   Wt 158 lb 6.4 oz (71.8 kg)   SpO2 96%   BMI 24.08 kg/m   EXAM: General appearance: alert, no distress and seating in wheelchair Neck: JVD - 3 cm above sternal notch and no carotid bruit Lungs: rales RLL Heart: irregularly irregular rhythm Abdomen: soft, non-tender; bowel sounds normal; no masses,  no organomegaly Extremities: edema 1+ bilateral pitting edema Pulses: 2+ and symmetric Skin: Skin color, texture,  turgor normal. No rashes or lesions Neurologic: Mental status: Slow thought process Psych: Mood is better today  EKG: Deferred  ASSESSMENT: 1. Chronic atrial fibrillation - CHADSVASC score of 6 (not on anticoagulation now due to [place]) - not a watchman candidate, maintained on aspirin 2. Recent stroke with intracerebral hemorrhage 3. History of congestive heart failure, EF of 45% - up to 55-60% by echo (07/2015) 4. Hypertension 5. History of thoracic aneurysm - stable at 4.6-4.8 cm (07/2015) 6. Diabetes  7. Dyslipidemia 8. Chronic kidney disease 9. DNR 10. Chronic oxygen use  PLAN: 1.   [name] seems to be retaining some extra volume.  I recommended increasing her Lasix today up to 40 mg daily.  She has had some worsening renal function which may have caused the providers to decrease her Lasix.  Although she remains volume overloaded and I am afraid we will have to accept slightly higher creatinine in order to keep her euvolemic.  She reports shortness of breath with very minimal exertion, even wearing chronic oxygen.  She has follow-up next week with her PCP and should follow-up with me in 3 months.  Pixie Casino, MD, [facility], [place] Director of the [facility] Lipid Disorders &  Card[facility]ovascular [facility] Attending Cardiologist  Direct Dial: [phone]  Fax: [phone]  Website:  [email or web address] 07/22/2017, 2:58 PM

## 2017-07-22 NOTE — Patient Instructions (Signed)
Medication Instructions: Take Furosemide 40 mg tablet daily.  If you need a refill on your cardiac medications before your next appointment, please call your pharmacy.    Follow-Up: Your physician wants you to follow-up in: 3 months with Dr. Debara Pickett. You will receive a reminder letter in the mail two months in advance. If you don't receive a letter, please call our office at [phone] to schedule this follow-up appointment.   Thank you for choosing [facility] at [facility]!!

## 2017-07-25 ENCOUNTER — Encounter: Payer: Self-pay | Admitting: Nurse Practitioner

## 2017-07-25 ENCOUNTER — Ambulatory Visit (INDEPENDENT_AMBULATORY_CARE_PROVIDER_SITE_OTHER): Payer: Medicare HMO | Admitting: Nurse Practitioner

## 2017-07-25 VITALS — BP 172/88 | HR 93 | Temp 98.4°F | Resp 16 | Ht 68.0 in | Wt 160.0 lb

## 2017-07-25 DIAGNOSIS — Z85038 Personal history of other malignant neoplasm of large intestine: Secondary | ICD-10-CM | POA: Diagnosis not present

## 2017-07-25 DIAGNOSIS — I10 Essential (primary) hypertension: Secondary | ICD-10-CM

## 2017-07-25 DIAGNOSIS — I5032 Chronic diastolic (congestive) heart failure: Secondary | ICD-10-CM

## 2017-07-25 DIAGNOSIS — M6281 Muscle weakness (generalized): Secondary | ICD-10-CM | POA: Diagnosis not present

## 2017-07-25 DIAGNOSIS — E034 Atrophy of thyroid (acquired): Secondary | ICD-10-CM

## 2017-07-25 DIAGNOSIS — I482 Chronic atrial fibrillation, unspecified: Secondary | ICD-10-CM

## 2017-07-25 DIAGNOSIS — E1122 Type 2 diabetes mellitus with diabetic chronic kidney disease: Secondary | ICD-10-CM | POA: Diagnosis not present

## 2017-07-25 DIAGNOSIS — I714 Abdominal aortic aneurysm, without rupture: Secondary | ICD-10-CM | POA: Diagnosis not present

## 2017-07-25 DIAGNOSIS — I1310 Hypertensive heart and chronic kidney disease without heart failure, with stage 1 through stage 4 chronic kidney disease, or unspecified chronic kidney disease: Secondary | ICD-10-CM | POA: Diagnosis not present

## 2017-07-25 DIAGNOSIS — R278 Other lack of coordination: Secondary | ICD-10-CM | POA: Diagnosis not present

## 2017-07-25 DIAGNOSIS — I5022 Chronic systolic (congestive) heart failure: Secondary | ICD-10-CM | POA: Diagnosis not present

## 2017-07-25 DIAGNOSIS — N183 Chronic kidney disease, stage 3 (moderate): Secondary | ICD-10-CM | POA: Diagnosis not present

## 2017-07-25 DIAGNOSIS — R69 Illness, unspecified: Secondary | ICD-10-CM | POA: Diagnosis not present

## 2017-07-25 MED ORDER — DILTIAZEM HCL ER COATED BEADS 120 MG PO CP24
120.0000 mg | ORAL_CAPSULE | Freq: Every day | ORAL | 1 refills | Status: DC
Start: 2017-07-25 — End: 2017-08-19

## 2017-07-25 NOTE — Progress Notes (Signed)
Careteam: Patient Care Team: [name], DO as PCP - General (Geriatric Medicine) Neldon Mc, MD as Consulting Physician (General Surgery) Debara Pickett Nadean Corwin, MD as Consulting Physician (Cardiology) Maia Breslow, MD as Consulting Physician (Orthopedic Surgery) Rutherford Guys, MD as Consulting Physician (Ophthalmology) Greg Cutter, LCSW as [place] Management (Licensed Clinical Social Worker)  Advanced Directive information Type of Advance Directive: Living will  Allergies  Allergen Reactions  . Iohexol Nausea And Vomiting  . Iodinated Diagnostic Agents Nausea And Vomiting  . Z-Pak [Azithromycin] Other (See Comments)    Reaction unknown by family    Chief Complaint  Patient presents with  . Follow-up    Pt is being seen for a follow up on blood pressure and heart rate.   . Forms    Family needs to have handicap placard application completed.   . Other    Caregiver, Renae, in room     HPI: Patient is a 84 y.o. female seen in the office today to follow up BP and HR. Pt with hx of CHF, a fib, CKD, htn, diabetes, dylipidemia and others.  Recent hospitalization in November and then revisit to ED due to CHF where she then went to rehab after. pts family was unsure of fluid restriction or how much fluid she was actually getting in the past and diuretics had been adjusted. Recent follow up with cardiologist which she had 7 lb weight gain and JVD therefore lasix was increase to 40 mg daily.  Caregiver reports she is on 1200 cc fluid restriction but not even taking that at this time. Reports swelling is about the same since lasix was started, reports weight is down 3 lbs. No increase in shortness of breath or swelling.  She was seen in our office 2 weeks ago and noted to be tachycardic, she was given bystolic x 1 with improvement.  Caregiver reports she was on vacation last week but otherwise HR.  Reports HR staying around 90-110 at home per  caregiver Blood pressure staying around 150-160/80 Caregiver here today and states she does not do her pills, she does what is "on the sheet"   When asked about mood she states she feels like it is getting better but care giver disagrees states she is still very tearful. Pt does admit mood could be better, also would like to try something to help it- agreeable to increasing medication    palliative care/hospice also discussed with pt and family at last visit.  Review of Systems:  Review of Systems  Constitutional: Positive for malaise/fatigue and weight loss. Negative for chills and fever.  HENT: Positive for hearing loss.   Eyes: Negative for blurred vision.       Glasses  Respiratory: Positive for shortness of breath (ongoing, chronic and stable). Negative for cough and wheezing.   Cardiovascular: Positive for leg swelling. Negative for chest pain and palpitations.  Gastrointestinal: Negative for abdominal pain, blood in stool, constipation and melena.  Genitourinary: Negative for dysuria.  Musculoskeletal: Negative for falls.  Skin: Negative for itching and rash.  Neurological: Positive for weakness. Negative for dizziness, loss of consciousness and headaches.  Endo/Heme/Allergies: Bruises/bleeds easily.  Psychiatric/Behavioral: Positive for depression and memory loss. The patient is not nervous/anxious and does not have insomnia.     Past Medical History:  Diagnosis Date  . AAA (abdominal aortic aneurysm) ([place])   . Anemia   . Anxiety   . Arthritis   . Atrial fibrillation ([place])   .  Cervicalgia   . Cholelithiasis   . Chronic systolic heart failure ([place])   . CKD (chronic kidney disease)   . Colon cancer ([place]) 07/1997  . Depression   . Diabetes mellitus ([place])   . Dizziness and giddiness   . Electrolyte and fluid disorders not elsewhere classified   . GERD (gastroesophageal reflux disease)   . Gout, unspecified   . Hemorrhage of rectum and anus   . Hypercholesteremia   .  Hypertension   . Hypoglycemia, unspecified   . Hypopotassemia   . Hypothyroidism   . Kidney stone    renal calculi  . Other chronic allergic conjunctivitis   . Other specified cardiac dysrhythmias(427.89)   . Pain in joint, lower leg   . Perforation of bile duct   . Proteinuria   . Shortness of breath   . Small bowel obstruction due to adhesions ([place]) 05/16/2012  . Stroke ([place])   . Swelling, mass, or lump in head and neck   . Thoracic aneurysm without mention of rupture    4.6 cm Asc Aortic aneurysm, CT 07/2015   Past Surgical History:  Procedure Laterality Date  . CARDIAC CATHETERIZATION  2008   moderate severe pulm HTN; with elevted pulm capillary wedge pressure   . CHOLECYSTECTOMY  05/14/2012   Procedure: LAPAROSCOPIC CHOLECYSTECTOMY WITH INTRAOPERATIVE CHOLANGIOGRAM;  Surgeon: Haywood Lasso, MD;  Location: [facility];  Service: General;  Laterality: N/A;  . ERCP  05/17/2012   Procedure: ENDOSCOPIC RETROGRADE CHOLANGIOPANCREATOGRAPHY (ERCP);  Surgeon: Missy Sabins, MD;  Location: [facility];  Service: Gastroenterology;  Laterality: N/A;  . ERCP  08/12/2012   Procedure: ENDOSCOPIC RETROGRADE CHOLANGIOPANCREATOGRAPHY (ERCP);  Surgeon: Missy Sabins, MD;  Location: [facility] ENDOSCOPY;  Service: Endoscopy;  Laterality: N/A;  . HEMICOLECTOMY  08/11/1997  . KIDNEY STONE SURGERY    . TRANSTHORACIC ECHOCARDIOGRAM  11/2009   EF 45-50%, mild-mod AV regurg; mild MR; mod TR; ascending aorta mildly dilated   Social History:   reports that  has never smoked. she has never used smokeless tobacco. She reports that she does not drink alcohol or use drugs.  Family History  Problem Relation Age of Onset  . Heart disease Mother   . Stroke Father   . Hypertension Daughter   . Hypertension Son   . Diabetes Sister   . Hypertension Son     Medications:   Medication List        Accurate as of 07/25/17  1:16 PM. Always use your most recent med list.          allopurinol 100 MG tablet Commonly known  as:  ZYLOPRIM TAKE 1 TABLET BY MOUTH  DAILY   donepezil 10 MG tablet Commonly known as:  ARICEPT Take one tablet by mouth once daily at bedtime   ferrous sulfate 325 (65 FE) MG EC tablet Take 1 tablet (325 mg total) by mouth 2 (two) times daily with a meal.   furosemide 40 MG tablet Commonly known as:  LASIX Take 1 tablet (40 mg total) by mouth daily.   hydrALAZINE 25 MG tablet Commonly known as:  APRESOLINE   ipratropium-albuterol 0.5-2.5 (3) MG/3ML Soln Commonly known as:  DUONEB   isosorbide mononitrate 30 MG 24 hr tablet Commonly known as:  IMDUR   labetalol 300 MG tablet Commonly known as:  NORMODYNE Take 1 tablet (300 mg total) by mouth 2 (two) times daily.   levothyroxine 25 MCG tablet Commonly known as:  SYNTHROID, LEVOTHROID   nebivolol 10 MG tablet  Commonly known as:  BYSTOLIC   NUTRITIONAL SUPPLEMENT Liqd   OXYGEN   potassium chloride 10 MEQ tablet Commonly known as:  K-DUR Take 2 tablets (20 mEq total) by mouth daily.   sertraline 50 MG tablet Commonly known as:  ZOLOFT Take 1 tablet (50 mg total) by mouth daily.        Physical Exam:  Vitals:   07/25/17 1311  BP: (!) 172/88  Pulse: 93  Resp: 16  Temp: 98.4 F (36.9 C)  TempSrc: Oral  SpO2: 95%  Weight: 160 lb (72.6 kg)  Height: 5\' 8"  (1.727 m)   Body mass index is 24.33 kg/m.  Physical Exam  Constitutional: No distress.  Thin, female, NAD but increased RR  HENT:  Head: Normocephalic and atraumatic.  Neck: No JVD present.  Cardiovascular: Normal heart sounds. An irregularly irregular rhythm present. Tachycardia present.  Pulmonary/Chest: Effort normal. She has decreased breath sounds. She has no rales.  Appears dyspneic  Abdominal: Soft. Bowel sounds are normal.  Musculoskeletal: Normal range of motion. She exhibits edema (2+ bilaterally).  Neurological: She is alert. No cranial nerve deficit.  Answering questions appropriately  Skin: Skin is warm and dry.  Psychiatric: She  has a normal mood and affect.    Labs reviewed: Basic Metabolic Panel: Recent Labs    01/11/17 2123 01/12/17 0700  05/23/17 0517  06/26/17 1254 06/29/17 1204 07/11/17 0851  NA  --  138   < >  --    < > 142 138 142  K  --  3.4*   < >  --    < > 3.5 3.3* 3.4*  CL  --  103   < >  --    < > 97 97* 102  CO2  --  23   < >  --    < > 31* 33* 30  GLUCOSE  --  136*   < >  --    < > 82 108* 84  BUN  --  12   < >  --    < > 11 8 16   CREATININE  --  1.07*   < >  --    < > 0.99 1.12* 1.60*  CALCIUM  --  8.7*   < >  --    < > 8.3* 8.5* 8.5*  MG  --  1.6*  --   --   --   --   --   --   TSH 0.244*  --   --  0.240*  --   --   --   --    < > = values in this interval not displayed.   Liver Function Tests: Recent Labs    11/07/16 0856  AST 17  ALT 10  ALKPHOS 107  BILITOT 1.0  PROT 7.6  ALBUMIN 3.6   No results for input(s): LIPASE, AMYLASE in the last 8760 hours. Recent Labs    01/11/17 2123  AMMONIA 24   CBC: Recent Labs    02/12/17 1053  05/23/17 0156 05/24/17 0347 05/25/17 0711 06/29/17 1204  WBC 6.2   < > 7.5 7.1 6.6 5.9  NEUTROABS 4,650  --  6.2  --   --  4.4  HGB 10.4*   < > 13.0 10.6* 11.0* 12.2  HCT 33.6*   < > 37.5 31.0* 32.3* 35.7*  MCV 75.2*   < > 74.0* 73.8* 75.1* 76.0*  PLT 326   < > 231 180 171 218   < > =  values in this interval not displayed.   Lipid Panel: Recent Labs    11/07/16 0856  CHOL 174  HDL 47*  LDLCALC 109*  TRIG 89  CHOLHDL 3.7   TSH: Recent Labs    01/11/17 2123 05/23/17 0517  TSH 0.244* 0.240*   A1C: Lab Results  Component Value Date   HGBA1C 6.8 (H) 01/17/2017     Assessment/Plan 1. Atrial fibrillation, chronic (HCC) HR ranging from 90-110 at home, very irregular in office today. -will add cardizem to labetalol for rate control.  - Amb Referral to Palliative Care - diltiazem (CARDIZEM CD) 120 MG 24 hr capsule; Take 1 capsule (120 mg total) by mouth daily.  Dispense: 30 capsule; Refill: 1  2. Chronic diastolic heart  failure ([place]) -weight up in cardiologist office 4 days ago and lasix 40 mg daily was started, reports home weight down 3 lbs since then. Will cont current regimen with fluid restriction.  - Amb Referral to Palliative Care  3. Essential hypertension -not controlled at this time. Will add cardizem with current regimen for BP and HR. - Amb Referral to Palliative Care - diltiazem (CARDIZEM CD) 120 MG 24 hr capsule; Take 1 capsule (120 mg total) by mouth daily.  Dispense: 30 capsule; Refill: 1  4. Hypothyroidism due to acquired atrophy of thyroid Synthroid was reduced to 25 mcg on 05/28/17, will follow up TSH at this time. - TSH  Next appt: 2 weeks for BP and HR Jessica K. Harle Battiest  [facility] [phone] 8 am - 5 pm) [phone] (after hours)

## 2017-07-25 NOTE — Patient Instructions (Addendum)
Start cardizem 120 mg by mouth daily for blood pressure and HR Please take and record these numbers daily and bring to follow up visit.   Follow up in 2 weeks on blood pressure, a fib and CHF  Will get lab work today to follow up thyroid

## 2017-07-26 ENCOUNTER — Other Ambulatory Visit: Payer: Self-pay | Admitting: Licensed Clinical Social Worker

## 2017-07-26 ENCOUNTER — Telehealth: Payer: Self-pay

## 2017-07-26 DIAGNOSIS — I482 Chronic atrial fibrillation: Secondary | ICD-10-CM | POA: Diagnosis not present

## 2017-07-26 DIAGNOSIS — I5022 Chronic systolic (congestive) heart failure: Secondary | ICD-10-CM | POA: Diagnosis not present

## 2017-07-26 DIAGNOSIS — I714 Abdominal aortic aneurysm, without rupture: Secondary | ICD-10-CM | POA: Diagnosis not present

## 2017-07-26 DIAGNOSIS — R278 Other lack of coordination: Secondary | ICD-10-CM | POA: Diagnosis not present

## 2017-07-26 DIAGNOSIS — Z85038 Personal history of other malignant neoplasm of large intestine: Secondary | ICD-10-CM | POA: Diagnosis not present

## 2017-07-26 DIAGNOSIS — E1122 Type 2 diabetes mellitus with diabetic chronic kidney disease: Secondary | ICD-10-CM | POA: Diagnosis not present

## 2017-07-26 DIAGNOSIS — R69 Illness, unspecified: Secondary | ICD-10-CM | POA: Diagnosis not present

## 2017-07-26 DIAGNOSIS — I1310 Hypertensive heart and chronic kidney disease without heart failure, with stage 1 through stage 4 chronic kidney disease, or unspecified chronic kidney disease: Secondary | ICD-10-CM | POA: Diagnosis not present

## 2017-07-26 DIAGNOSIS — N183 Chronic kidney disease, stage 3 (moderate): Secondary | ICD-10-CM | POA: Diagnosis not present

## 2017-07-26 DIAGNOSIS — M6281 Muscle weakness (generalized): Secondary | ICD-10-CM | POA: Diagnosis not present

## 2017-07-26 LAB — TSH: TSH: 4.29 mIU/L (ref 0.40–4.50)

## 2017-07-26 NOTE — Telephone Encounter (Signed)
Agree with Dr Mariea Clonts.

## 2017-07-26 NOTE — Telephone Encounter (Signed)
Left detailed message with Dr.[name]'s recommendations. Patient instructed to call if questions or concerns.   Left detailed message on voicemail for [facility] nurse as well.  Patient's daughter Peter Congo called right back and verbalized understanding of instructions.

## 2017-07-26 NOTE — Telephone Encounter (Signed)
They need to begin the cardizem asap and she needs to wear her oxygen.  Both of these will help her symptoms.  She also needs to maintain her 1200 cc fluid restriction, elevate her feet at rest.

## 2017-07-26 NOTE — Addendum Note (Signed)
Addended by: Vennie Homans on: 07/26/2017 05:21 PM   Modules accepted: Orders

## 2017-07-26 NOTE — Telephone Encounter (Signed)
Denise with [facility] called to inform Janett Billow and Dr.[name] patient's heart rate is ranging from 90-123, other vitals stable B/P 140/60, 99% oxygen.  Patient was prescribed Cardizem yesterday and did not pick-up rx yet, plans to pick-up today.  Patient with increased swelling and was not on oxygen when [place] arrived, [place] had patient connect oxygen.    Please follow-up with patient and Langley Gauss Passenger transport manager Nurse)

## 2017-07-26 NOTE — Patient Outreach (Signed)
[facility]) Care Management  07/26/2017  Nicole [name] 09-05-1930 [id number]  Assessment-CSW completed second outreach attempt today in order to review grief support resources with patient. CSW unable to reach patient successfully. CSW left a HIPPA compliant voice message encouraging patient to return call once available.  Plan-CSW will await return call or complete an additional outreach within two weeks.  Eula Fried, BSW, MSW, [email or web address] Phone: [phone] Fax: [phone]

## 2017-07-28 DIAGNOSIS — J189 Pneumonia, unspecified organism: Secondary | ICD-10-CM | POA: Diagnosis not present

## 2017-07-28 DIAGNOSIS — I5032 Chronic diastolic (congestive) heart failure: Secondary | ICD-10-CM | POA: Diagnosis not present

## 2017-07-28 DIAGNOSIS — M6281 Muscle weakness (generalized): Secondary | ICD-10-CM | POA: Diagnosis not present

## 2017-07-28 DIAGNOSIS — I129 Hypertensive chronic kidney disease with stage 1 through stage 4 chronic kidney disease, or unspecified chronic kidney disease: Secondary | ICD-10-CM | POA: Diagnosis not present

## 2017-07-28 DIAGNOSIS — R69 Illness, unspecified: Secondary | ICD-10-CM | POA: Diagnosis not present

## 2017-07-28 DIAGNOSIS — M109 Gout, unspecified: Secondary | ICD-10-CM | POA: Diagnosis not present

## 2017-07-28 DIAGNOSIS — R0602 Shortness of breath: Secondary | ICD-10-CM | POA: Diagnosis not present

## 2017-07-29 ENCOUNTER — Telehealth: Payer: Self-pay | Admitting: Internal Medicine

## 2017-07-29 ENCOUNTER — Other Ambulatory Visit: Payer: Self-pay | Admitting: *Deleted

## 2017-07-29 DIAGNOSIS — E1122 Type 2 diabetes mellitus with diabetic chronic kidney disease: Secondary | ICD-10-CM | POA: Diagnosis not present

## 2017-07-29 DIAGNOSIS — I1310 Hypertensive heart and chronic kidney disease without heart failure, with stage 1 through stage 4 chronic kidney disease, or unspecified chronic kidney disease: Secondary | ICD-10-CM | POA: Diagnosis not present

## 2017-07-29 DIAGNOSIS — I5022 Chronic systolic (congestive) heart failure: Secondary | ICD-10-CM | POA: Diagnosis not present

## 2017-07-29 DIAGNOSIS — I482 Chronic atrial fibrillation: Secondary | ICD-10-CM | POA: Diagnosis not present

## 2017-07-29 DIAGNOSIS — R69 Illness, unspecified: Secondary | ICD-10-CM | POA: Diagnosis not present

## 2017-07-29 DIAGNOSIS — Z85038 Personal history of other malignant neoplasm of large intestine: Secondary | ICD-10-CM | POA: Diagnosis not present

## 2017-07-29 DIAGNOSIS — M6281 Muscle weakness (generalized): Secondary | ICD-10-CM | POA: Diagnosis not present

## 2017-07-29 DIAGNOSIS — I714 Abdominal aortic aneurysm, without rupture: Secondary | ICD-10-CM | POA: Diagnosis not present

## 2017-07-29 DIAGNOSIS — R278 Other lack of coordination: Secondary | ICD-10-CM | POA: Diagnosis not present

## 2017-07-29 DIAGNOSIS — F419 Anxiety disorder, unspecified: Secondary | ICD-10-CM

## 2017-07-29 DIAGNOSIS — N183 Chronic kidney disease, stage 3 (moderate): Secondary | ICD-10-CM | POA: Diagnosis not present

## 2017-07-29 MED ORDER — POTASSIUM CHLORIDE ER 10 MEQ PO TBCR: 20.0000 meq | EXTENDED_RELEASE_TABLET | Freq: Every day | ORAL | 3 refills | Status: DC

## 2017-07-29 MED ORDER — HYDRALAZINE HCL 25 MG PO TABS: 75.0000 mg | ORAL_TABLET | Freq: Three times a day (TID) | ORAL | 1 refills | Status: DC

## 2017-07-29 MED ORDER — NEBIVOLOL HCL 10 MG PO TABS: 10.0000 mg | ORAL_TABLET | Freq: Every day | ORAL | 3 refills | Status: DC

## 2017-07-29 MED ORDER — SERTRALINE HCL 50 MG PO TABS: 50.0000 mg | ORAL_TABLET | Freq: Every day | ORAL | 3 refills | Status: DC

## 2017-07-29 NOTE — Telephone Encounter (Signed)
New message    Nicole [name] from [facility] [phone], calling to report patient is doing much better from last week. States HR 72, o2  99% 3l, weight 155.3

## 2017-07-29 NOTE — Telephone Encounter (Signed)
Langley Gauss has been instructed to call the patient's PCP. She verbalized her understanding.

## 2017-07-29 NOTE — Telephone Encounter (Signed)
Who arranged for home health? If it was cardiology, we can extend it. If it was hospitalist, then defer to PCP.  Dr Lemmie Evens

## 2017-07-29 NOTE — Telephone Encounter (Signed)
[name], caregiver called and stated that they need Rx faxed to [facility]. Changing pharmacies. Faxed.

## 2017-07-29 NOTE — Telephone Encounter (Signed)
Message received from [place], nurse at [facility]. She called to state that the patient is doing much better but would like to extend home health for two more weeks, two times a week. Message will be routed to the provider for his recommendation.

## 2017-07-30 DIAGNOSIS — R69 Illness, unspecified: Secondary | ICD-10-CM | POA: Diagnosis not present

## 2017-07-30 DIAGNOSIS — N183 Chronic kidney disease, stage 3 (moderate): Secondary | ICD-10-CM | POA: Diagnosis not present

## 2017-07-30 DIAGNOSIS — I5022 Chronic systolic (congestive) heart failure: Secondary | ICD-10-CM | POA: Diagnosis not present

## 2017-07-30 DIAGNOSIS — R278 Other lack of coordination: Secondary | ICD-10-CM | POA: Diagnosis not present

## 2017-07-30 DIAGNOSIS — Z85038 Personal history of other malignant neoplasm of large intestine: Secondary | ICD-10-CM | POA: Diagnosis not present

## 2017-07-30 DIAGNOSIS — I1310 Hypertensive heart and chronic kidney disease without heart failure, with stage 1 through stage 4 chronic kidney disease, or unspecified chronic kidney disease: Secondary | ICD-10-CM | POA: Diagnosis not present

## 2017-07-30 DIAGNOSIS — M6281 Muscle weakness (generalized): Secondary | ICD-10-CM | POA: Diagnosis not present

## 2017-07-30 DIAGNOSIS — I714 Abdominal aortic aneurysm, without rupture: Secondary | ICD-10-CM | POA: Diagnosis not present

## 2017-07-30 DIAGNOSIS — E1122 Type 2 diabetes mellitus with diabetic chronic kidney disease: Secondary | ICD-10-CM | POA: Diagnosis not present

## 2017-07-30 DIAGNOSIS — I482 Chronic atrial fibrillation: Secondary | ICD-10-CM | POA: Diagnosis not present

## 2017-07-31 DIAGNOSIS — I5022 Chronic systolic (congestive) heart failure: Secondary | ICD-10-CM | POA: Diagnosis not present

## 2017-07-31 DIAGNOSIS — R278 Other lack of coordination: Secondary | ICD-10-CM | POA: Diagnosis not present

## 2017-07-31 DIAGNOSIS — I714 Abdominal aortic aneurysm, without rupture: Secondary | ICD-10-CM | POA: Diagnosis not present

## 2017-07-31 DIAGNOSIS — I482 Chronic atrial fibrillation: Secondary | ICD-10-CM | POA: Diagnosis not present

## 2017-07-31 DIAGNOSIS — Z85038 Personal history of other malignant neoplasm of large intestine: Secondary | ICD-10-CM | POA: Diagnosis not present

## 2017-07-31 DIAGNOSIS — M6281 Muscle weakness (generalized): Secondary | ICD-10-CM | POA: Diagnosis not present

## 2017-07-31 DIAGNOSIS — I1310 Hypertensive heart and chronic kidney disease without heart failure, with stage 1 through stage 4 chronic kidney disease, or unspecified chronic kidney disease: Secondary | ICD-10-CM | POA: Diagnosis not present

## 2017-07-31 DIAGNOSIS — N183 Chronic kidney disease, stage 3 (moderate): Secondary | ICD-10-CM | POA: Diagnosis not present

## 2017-07-31 DIAGNOSIS — E1122 Type 2 diabetes mellitus with diabetic chronic kidney disease: Secondary | ICD-10-CM | POA: Diagnosis not present

## 2017-07-31 DIAGNOSIS — R69 Illness, unspecified: Secondary | ICD-10-CM | POA: Diagnosis not present

## 2017-08-01 ENCOUNTER — Other Ambulatory Visit: Payer: Self-pay | Admitting: Licensed Clinical Social Worker

## 2017-08-01 NOTE — Patient Outreach (Signed)
[facility]) Care Management 08/01/2017  CHETARA KROPP 1931/02/26 [id number]  Assessment- CSW completed final outreach call on 08/01/17 and was unable to reach patient successfully. CSW will send outreach batter letter at this time and await 10 business days to hear back from patient before closing case.  Plan-CSW will send outreach barrier letter to patient at this time.  Eula Fried, BSW, MSW, [email or web address] Phone: [phone] Fax: [phone]

## 2017-08-02 DIAGNOSIS — I482 Chronic atrial fibrillation: Secondary | ICD-10-CM | POA: Diagnosis not present

## 2017-08-02 DIAGNOSIS — I5022 Chronic systolic (congestive) heart failure: Secondary | ICD-10-CM | POA: Diagnosis not present

## 2017-08-02 DIAGNOSIS — I714 Abdominal aortic aneurysm, without rupture: Secondary | ICD-10-CM | POA: Diagnosis not present

## 2017-08-02 DIAGNOSIS — E1122 Type 2 diabetes mellitus with diabetic chronic kidney disease: Secondary | ICD-10-CM | POA: Diagnosis not present

## 2017-08-02 DIAGNOSIS — R278 Other lack of coordination: Secondary | ICD-10-CM | POA: Diagnosis not present

## 2017-08-02 DIAGNOSIS — Z85038 Personal history of other malignant neoplasm of large intestine: Secondary | ICD-10-CM | POA: Diagnosis not present

## 2017-08-02 DIAGNOSIS — N183 Chronic kidney disease, stage 3 (moderate): Secondary | ICD-10-CM | POA: Diagnosis not present

## 2017-08-02 DIAGNOSIS — I1310 Hypertensive heart and chronic kidney disease without heart failure, with stage 1 through stage 4 chronic kidney disease, or unspecified chronic kidney disease: Secondary | ICD-10-CM | POA: Diagnosis not present

## 2017-08-02 DIAGNOSIS — M6281 Muscle weakness (generalized): Secondary | ICD-10-CM | POA: Diagnosis not present

## 2017-08-02 DIAGNOSIS — R69 Illness, unspecified: Secondary | ICD-10-CM | POA: Diagnosis not present

## 2017-08-05 ENCOUNTER — Telehealth: Payer: Self-pay | Admitting: *Deleted

## 2017-08-05 NOTE — Telephone Encounter (Signed)
Patient daughter, Peter Congo called and left message on Clinical Intake and stated that patient's medication cost too much. Stated it was over $120.00.  Tried calling back and left message for her to call us with what medication she is referring to and to call patient's insurance company to see what medication is comparable that the insurance will cover.

## 2017-08-06 ENCOUNTER — Telehealth: Payer: Self-pay | Admitting: Internal Medicine

## 2017-08-06 ENCOUNTER — Telehealth: Payer: Self-pay | Admitting: *Deleted

## 2017-08-06 DIAGNOSIS — I1310 Hypertensive heart and chronic kidney disease without heart failure, with stage 1 through stage 4 chronic kidney disease, or unspecified chronic kidney disease: Secondary | ICD-10-CM | POA: Diagnosis not present

## 2017-08-06 DIAGNOSIS — Z85038 Personal history of other malignant neoplasm of large intestine: Secondary | ICD-10-CM | POA: Diagnosis not present

## 2017-08-06 DIAGNOSIS — I10 Essential (primary) hypertension: Secondary | ICD-10-CM | POA: Diagnosis not present

## 2017-08-06 DIAGNOSIS — E1122 Type 2 diabetes mellitus with diabetic chronic kidney disease: Secondary | ICD-10-CM | POA: Diagnosis not present

## 2017-08-06 DIAGNOSIS — R69 Illness, unspecified: Secondary | ICD-10-CM | POA: Diagnosis not present

## 2017-08-06 DIAGNOSIS — R260 Ataxic gait: Secondary | ICD-10-CM | POA: Diagnosis not present

## 2017-08-06 DIAGNOSIS — I482 Chronic atrial fibrillation: Secondary | ICD-10-CM | POA: Diagnosis not present

## 2017-08-06 DIAGNOSIS — Z7982 Long term (current) use of aspirin: Secondary | ICD-10-CM | POA: Diagnosis not present

## 2017-08-06 DIAGNOSIS — N183 Chronic kidney disease, stage 3 (moderate): Secondary | ICD-10-CM | POA: Diagnosis not present

## 2017-08-06 DIAGNOSIS — R278 Other lack of coordination: Secondary | ICD-10-CM | POA: Diagnosis not present

## 2017-08-06 DIAGNOSIS — I5022 Chronic systolic (congestive) heart failure: Secondary | ICD-10-CM | POA: Diagnosis not present

## 2017-08-06 DIAGNOSIS — I714 Abdominal aortic aneurysm, without rupture: Secondary | ICD-10-CM | POA: Diagnosis not present

## 2017-08-06 DIAGNOSIS — Z9181 History of falling: Secondary | ICD-10-CM | POA: Diagnosis not present

## 2017-08-06 DIAGNOSIS — M6281 Muscle weakness (generalized): Secondary | ICD-10-CM | POA: Diagnosis not present

## 2017-08-06 NOTE — Telephone Encounter (Signed)
Nicole [name] called back to say that the medication is Bystolic 10 mg and it will cost $147. Patient cannot afford that and Nicole [name] would like to know if there are any alternative medications patient could take. Nicole [name] did state that she would try to contact insurance to find out what they will cover that cost less.

## 2017-08-06 NOTE — Telephone Encounter (Signed)
Give them some samples in the meantime if we have them.  If we don't, check with Roderic Palau or his colleague.

## 2017-08-06 NOTE — Telephone Encounter (Signed)
error 

## 2017-08-06 NOTE — Telephone Encounter (Signed)
I spoke with patient's daughter to let her know that samples of bystolic 10 mg tablets were placed at front desk for pick up. She verbalized understanding.

## 2017-08-06 NOTE — Telephone Encounter (Signed)
I recommend hospice care for her.  I responded earlier that bystolic samples could be provided

## 2017-08-06 NOTE — Telephone Encounter (Signed)
Denise with [facility] called and stated that patient's weight is up 08/06/17- 158.2lb 08/02/17- 154.6lb 07/29/17- 155.8lb  Pulse 106, O2 98% on 3lpm, Edema +2  Nurse stated that patient is not eating, refusing to wear O2 as directed. Patient is taking her medications as directed, but has not gotten the Bystolic because of cost $311.21. Daughter is Administrator company to see what insurance will pay for and what is comparable. Patient is currently taking Lasix 40mg  daily.  Please Advise.

## 2017-08-06 NOTE — Telephone Encounter (Signed)
Called and spoke with [place] nurse and she stated that patient's blood pressure has been running good.   Stated that the family refused the Hospice when she spoke with them about it.  (patient has an appointment on 1/17 with Jessica-I added to discuss Hospice with patient per nurse suggestion).

## 2017-08-06 NOTE — Telephone Encounter (Signed)
Patient was given a month's supply of bystolic 10 mg. Patient's daughter, Peter Congo, is aware and will be by to pick them up

## 2017-08-07 DIAGNOSIS — R69 Illness, unspecified: Secondary | ICD-10-CM | POA: Diagnosis not present

## 2017-08-07 DIAGNOSIS — R278 Other lack of coordination: Secondary | ICD-10-CM | POA: Diagnosis not present

## 2017-08-07 DIAGNOSIS — Z85038 Personal history of other malignant neoplasm of large intestine: Secondary | ICD-10-CM | POA: Diagnosis not present

## 2017-08-07 DIAGNOSIS — M6281 Muscle weakness (generalized): Secondary | ICD-10-CM | POA: Diagnosis not present

## 2017-08-07 DIAGNOSIS — E1122 Type 2 diabetes mellitus with diabetic chronic kidney disease: Secondary | ICD-10-CM | POA: Diagnosis not present

## 2017-08-07 DIAGNOSIS — I714 Abdominal aortic aneurysm, without rupture: Secondary | ICD-10-CM | POA: Diagnosis not present

## 2017-08-07 DIAGNOSIS — I5022 Chronic systolic (congestive) heart failure: Secondary | ICD-10-CM | POA: Diagnosis not present

## 2017-08-07 DIAGNOSIS — I482 Chronic atrial fibrillation: Secondary | ICD-10-CM | POA: Diagnosis not present

## 2017-08-07 DIAGNOSIS — N183 Chronic kidney disease, stage 3 (moderate): Secondary | ICD-10-CM | POA: Diagnosis not present

## 2017-08-07 DIAGNOSIS — I1310 Hypertensive heart and chronic kidney disease without heart failure, with stage 1 through stage 4 chronic kidney disease, or unspecified chronic kidney disease: Secondary | ICD-10-CM | POA: Diagnosis not present

## 2017-08-08 ENCOUNTER — Ambulatory Visit (INDEPENDENT_AMBULATORY_CARE_PROVIDER_SITE_OTHER): Payer: Medicare HMO | Admitting: Nurse Practitioner

## 2017-08-08 ENCOUNTER — Encounter: Payer: Self-pay | Admitting: Nurse Practitioner

## 2017-08-08 VITALS — BP 118/62 | HR 83 | Temp 98.2°F | Ht 68.0 in | Wt 162.0 lb

## 2017-08-08 DIAGNOSIS — Z85038 Personal history of other malignant neoplasm of large intestine: Secondary | ICD-10-CM | POA: Diagnosis not present

## 2017-08-08 DIAGNOSIS — I5032 Chronic diastolic (congestive) heart failure: Secondary | ICD-10-CM

## 2017-08-08 DIAGNOSIS — F0150 Vascular dementia without behavioral disturbance: Secondary | ICD-10-CM

## 2017-08-08 DIAGNOSIS — I1310 Hypertensive heart and chronic kidney disease without heart failure, with stage 1 through stage 4 chronic kidney disease, or unspecified chronic kidney disease: Secondary | ICD-10-CM | POA: Diagnosis not present

## 2017-08-08 DIAGNOSIS — E1122 Type 2 diabetes mellitus with diabetic chronic kidney disease: Secondary | ICD-10-CM | POA: Diagnosis not present

## 2017-08-08 DIAGNOSIS — I10 Essential (primary) hypertension: Secondary | ICD-10-CM | POA: Diagnosis not present

## 2017-08-08 DIAGNOSIS — I714 Abdominal aortic aneurysm, without rupture: Secondary | ICD-10-CM | POA: Diagnosis not present

## 2017-08-08 DIAGNOSIS — I482 Chronic atrial fibrillation, unspecified: Secondary | ICD-10-CM

## 2017-08-08 DIAGNOSIS — N183 Chronic kidney disease, stage 3 (moderate): Secondary | ICD-10-CM | POA: Diagnosis not present

## 2017-08-08 DIAGNOSIS — I5022 Chronic systolic (congestive) heart failure: Secondary | ICD-10-CM | POA: Diagnosis not present

## 2017-08-08 DIAGNOSIS — R278 Other lack of coordination: Secondary | ICD-10-CM | POA: Diagnosis not present

## 2017-08-08 DIAGNOSIS — R69 Illness, unspecified: Secondary | ICD-10-CM | POA: Diagnosis not present

## 2017-08-08 DIAGNOSIS — M6281 Muscle weakness (generalized): Secondary | ICD-10-CM | POA: Diagnosis not present

## 2017-08-08 LAB — BASIC METABOLIC PANEL WITH GFR
BUN / CREAT RATIO: 18 (calc) (ref 6–22)
BUN: 23 mg/dL (ref 7–25)
CO2: 32 mmol/L (ref 20–32)
CREATININE: 1.29 mg/dL — AB (ref 0.60–0.88)
Calcium: 8.3 mg/dL — ABNORMAL LOW (ref 8.6–10.4)
Chloride: 103 mmol/L (ref 98–110)
GFR, EST NON AFRICAN AMERICAN: 38 mL/min/{1.73_m2} — AB (ref >=60)
GFR, Est African American: 44 mL/min/{1.73_m2} — ABNORMAL LOW (ref >=60)
GLUCOSE: 88 mg/dL (ref 65–139)
Potassium: 3.6 mmol/L (ref 3.5–5.3)
SODIUM: 141 mmol/L (ref 135–146)

## 2017-08-08 MED ORDER — FERROUS SULFATE 325 (65 FE) MG PO TBEC: 325.0000 mg | DELAYED_RELEASE_TABLET | Freq: Two times a day (BID) | ORAL | 3 refills | Status: DC

## 2017-08-08 MED ORDER — ISOSORBIDE MONONITRATE ER 30 MG PO TB24: 30.0000 mg | ORAL_TABLET | Freq: Two times a day (BID) | ORAL | 1 refills | Status: DC

## 2017-08-08 MED ORDER — DONEPEZIL HCL 10 MG PO TABS: ORAL_TABLET | ORAL | 3 refills | Status: DC

## 2017-08-08 MED ORDER — FUROSEMIDE 40 MG PO TABS: 40.0000 mg | ORAL_TABLET | Freq: Every day | ORAL | 3 refills | Status: DC

## 2017-08-08 MED ORDER — LABETALOL HCL 300 MG PO TABS: 300.0000 mg | ORAL_TABLET | Freq: Two times a day (BID) | ORAL | 1 refills | Status: DC

## 2017-08-08 MED ORDER — ALLOPURINOL 100 MG PO TABS: 100.0000 mg | ORAL_TABLET | Freq: Every day | ORAL | 1 refills | Status: DC

## 2017-08-08 NOTE — Progress Notes (Signed)
Careteam: Patient Care Team: [name], DO as PCP - General (Geriatric Medicine) Neldon Mc, MD as Consulting Physician (General Surgery) Debara Pickett Nadean Corwin, MD as Consulting Physician (Cardiology) Maia Breslow, MD as Consulting Physician (Orthopedic Surgery) Rutherford Guys, MD as Consulting Physician (Ophthalmology) Greg Cutter, LCSW as [place] (Licensed Clinical Social Worker)  Advanced Directive information    Allergies  Allergen Reactions  . Iohexol Nausea And Vomiting  . Iodinated Diagnostic Agents Nausea And Vomiting  . Z-Pak [Azithromycin] Other (See Comments)    Reaction unknown by family    Chief Complaint  Patient presents with  . Medical Management of Chronic Issues    Medical Management Blood Pressure, AFIB and CHF. Discuss Hospice per Dr. Georgann Housekeeper telephone note dated 08/06/17     HPI: Patient is a 84 y.o. female seen in the office today for blood pressure follow up.  Weight has been going up from 155 lbs on 1/7, 154 on 1/11, 158 on 1/15 (was given additional lasix that the caregiver thinks), 159 today. Her lasix was 20 mg daily until she saw cardiologist on 07/22/17 and it was increased to 40 mg daily weight initially went down ~158 lbs at that time Blood pressure has improved.  The caregiver here today says she can not keep up with the fluid resctrition because she is only there for 4 hours.  1200 cc in 24 hours.  Unsure if she is drinking more than that or not.   There was a phone note for 08/06/17 that noted that the patient was not eating, refusing to wear O2. Dr Mariea Clonts recommended hospice at that time but the family refused this referral. Pt also does not want hospice at this time  More shortness of breath when she does not wear O2, aware she needs it but will not wear it. Does not have O2 on today at [place].   Review of Systems:  Review of Systems  Unable to perform ROS: Dementia    Past Medical History:    Diagnosis Date  . AAA (abdominal aortic aneurysm) ([place])   . Anemia   . Anxiety   . Arthritis   . Atrial fibrillation ([place])   . Cervicalgia   . Cholelithiasis   . Chronic systolic heart failure ([place])   . CKD (chronic kidney disease)   . Colon cancer ([place]) 07/1997  . Depression   . Diabetes mellitus ([place])   . Dizziness and giddiness   . Electrolyte and fluid disorders not elsewhere classified   . GERD (gastroesophageal reflux disease)   . Gout, unspecified   . Hemorrhage of rectum and anus   . Hypercholesteremia   . Hypertension   . Hypoglycemia, unspecified   . Hypopotassemia   . Hypothyroidism   . Kidney stone    renal calculi  . Other chronic allergic conjunctivitis   . Other specified cardiac dysrhythmias(427.89)   . Pain in joint, lower leg   . Perforation of bile duct   . Proteinuria   . Shortness of breath   . Small bowel obstruction due to adhesions ([place]) 05/16/2012  . Stroke ([place])   . Swelling, mass, or lump in head and neck   . Thoracic aneurysm without mention of rupture    4.6 cm Asc Aortic aneurysm, CT 07/2015   Past Surgical History:  Procedure Laterality Date  . CARDIAC CATHETERIZATION  2008   moderate severe pulm HTN; with elevted pulm capillary wedge pressure   . CHOLECYSTECTOMY  05/14/2012  Procedure: LAPAROSCOPIC CHOLECYSTECTOMY WITH INTRAOPERATIVE CHOLANGIOGRAM;  Surgeon: Haywood Lasso, MD;  Location: [place];  Service: General;  Laterality: N/A;  . ERCP  05/17/2012   Procedure: ENDOSCOPIC RETROGRADE CHOLANGIOPANCREATOGRAPHY (ERCP);  Surgeon: Missy Sabins, MD;  Location: [place];  Service: Gastroenterology;  Laterality: N/A;  . ERCP  08/12/2012   Procedure: ENDOSCOPIC RETROGRADE CHOLANGIOPANCREATOGRAPHY (ERCP);  Surgeon: Missy Sabins, MD;  Location: [facility] ENDOSCOPY;  Service: Endoscopy;  Laterality: N/A;  . HEMICOLECTOMY  08/11/1997  . KIDNEY STONE SURGERY    . TRANSTHORACIC ECHOCARDIOGRAM  11/2009   EF 45-50%, mild-mod AV regurg; mild MR; mod TR;  ascending aorta mildly dilated   Social History:   reports that  has never smoked. she has never used smokeless tobacco. She reports that she does not drink alcohol or use drugs.  Family History  Problem Relation Age of Onset  . Heart disease Mother   . Stroke Father   . Hypertension Daughter   . Hypertension Son   . Diabetes Sister   . Hypertension Son     Medications: Patient's Medications  New Prescriptions   No medications on file  Previous Medications   ALLOPURINOL (ZYLOPRIM) 100 MG TABLET    TAKE 1 TABLET BY MOUTH  DAILY   DILTIAZEM (CARDIZEM CD) 120 MG 24 HR CAPSULE    Take 1 capsule (120 mg total) by mouth daily.   DONEPEZIL (ARICEPT) 10 MG TABLET    Take one tablet by mouth once daily at bedtime   FERROUS SULFATE 325 (65 FE) MG EC TABLET    Take 1 tablet (325 mg total) by mouth 2 (two) times daily with a meal.   FUROSEMIDE (LASIX) 40 MG TABLET    Take 1 tablet (40 mg total) by mouth daily.   HYDRALAZINE (APRESOLINE) 25 MG TABLET    Take 3 tablets (75 mg total) by mouth 3 (three) times daily. Take 3 tablets to = 75 mg TID   IPRATROPIUM-ALBUTEROL (DUONEB) 0.5-2.5 (3) MG/3ML SOLN    Take 3 mLs by nebulization every 6 (six) hours as needed (SOB/wheezing).   ISOSORBIDE MONONITRATE (IMDUR) 30 MG 24 HR TABLET    Take 30 mg by mouth 2 (two) times daily.   LABETALOL (NORMODYNE) 300 MG TABLET    Take 1 tablet (300 mg total) by mouth 2 (two) times daily.   LEVOTHYROXINE (SYNTHROID, LEVOTHROID) 25 MCG TABLET    Take 25 mcg by mouth daily before breakfast.   NEBIVOLOL (BYSTOLIC) 10 MG TABLET    Take 1 tablet (10 mg total) by mouth daily.   NUTRITIONAL SUPPLEMENT LIQD    Take 120 mLs by mouth 2 (two) times daily.   OXYGEN    Inhale 2 L into the lungs as needed.   POTASSIUM CHLORIDE (K-DUR) 10 MEQ TABLET    Take 2 tablets (20 mEq total) by mouth daily.   SERTRALINE (ZOLOFT) 50 MG TABLET    Take 1 tablet (50 mg total) by mouth daily.  Modified Medications   No medications on file    Discontinued Medications   No medications on file     Physical Exam:  Vitals:   08/08/17 1259  BP: 118/62  Pulse: 83  Temp: 98.2 F (36.8 C)  TempSrc: Oral  SpO2: 92%  Weight: 162 lb (73.5 kg)  Height: 5' 8"  (1.727 m)   Body mass index is 24.63 kg/m.  Physical Exam  Constitutional: No distress.  Thin, female, NAD but increased RR  HENT:  Head: Normocephalic and atraumatic.  Mouth/Throat: Oropharynx is clear and moist.  Neck: JVD present.  Cardiovascular: Normal rate and normal heart sounds. An irregularly irregular rhythm present.  Pulmonary/Chest: Effort normal. She has decreased breath sounds. She has no rales.  Abdominal: Soft. Bowel sounds are normal.  Musculoskeletal: Normal range of motion. She exhibits edema (2+ bilaterally).  Neurological: She is alert. No cranial nerve deficit.  Skin: Skin is warm and dry.  Psychiatric: She has a normal mood and affect.    Labs reviewed: Basic Metabolic Panel: Recent Labs    01/11/17 2123 01/12/17 0700  05/23/17 0517  06/26/17 1254 06/29/17 1204 07/11/17 0851 07/25/17 1355  NA  --  138   < >  --    < > 142 138 142  --   K  --  3.4*   < >  --    < > 3.5 3.3* 3.4*  --   CL  --  103   < >  --    < > 97 97* 102  --   CO2  --  23   < >  --    < > 31* 33* 30  --   GLUCOSE  --  136*   < >  --    < > 82 108* 84  --   BUN  --  12   < >  --    < > 11 8 16   --   CREATININE  --  1.07*   < >  --    < > 0.99 1.12* 1.60*  --   CALCIUM  --  8.7*   < >  --    < > 8.3* 8.5* 8.5*  --   MG  --  1.6*  --   --   --   --   --   --   --   TSH 0.244*  --   --  0.240*  --   --   --   --  4.29   < > = values in this interval not displayed.   Liver Function Tests: Recent Labs    11/07/16 0856  AST 17  ALT 10  ALKPHOS 107  BILITOT 1.0  PROT 7.6  ALBUMIN 3.6   No results for input(s): LIPASE, AMYLASE in the last 8760 hours. Recent Labs    01/11/17 2123  AMMONIA 24   CBC: Recent Labs    02/12/17 1053  05/23/17 0156  05/24/17 0347 05/25/17 0711 06/29/17 1204  WBC 6.2   < > 7.5 7.1 6.6 5.9  NEUTROABS 4,650  --  6.2  --   --  4.4  HGB 10.4*   < > 13.0 10.6* 11.0* 12.2  HCT 33.6*   < > 37.5 31.0* 32.3* 35.7*  MCV 75.2*   < > 74.0* 73.8* 75.1* 76.0*  PLT 326   < > 231 180 171 218   < > = values in this interval not displayed.   Lipid Panel: Recent Labs    11/07/16 0856  CHOL 174  HDL 47*  LDLCALC 109*  TRIG 89  CHOLHDL 3.7   TSH: Recent Labs    01/11/17 2123 05/23/17 0517 07/25/17 1355  TSH 0.244* 0.240* 4.29   A1C: Lab Results  Component Value Date   HGBA1C 6.8 (H) 01/17/2017     Assessment/Plan 1. Vascular dementia without behavioral disturbance -progressive disease, discussed hospice due to refusing medication/o2/fluid restriction with progressive CHF and dementia however she declines this at this time. Caregiver at  visit. Went into detail about disease progression and need for compliance however hard to tell what is actually going on due to pt with dementia and caregiver reports she is not there often.  - donepezil (ARICEPT) 10 MG tablet; Take one tablet by mouth once daily at bedtime  Dispense: 90 tablet; Refill: 3  2. Atrial fibrillation, chronic (HCC) Rate controlled at this time, will cont current regimen  3. Chronic diastolic heart failure (HCC) Increase fluid volume this time. Weight is up with edema and JVD, denies shortness of breath except when she does not wear O2, not currently with O2 and noncompliant at home. Not complaining of SOBr currently. Caregiver who is with pt at visit can not give much of a hx or confirm or deny her fluid status at home. To increase lasix to 40 mg by mouth PO BID For next 3 days. To cont daily weights. Encouraged compliance with fluid restriction If weights cont to trend up will need to follow up with cardiologist. - BMP with eGFR([facility])  4. Essential hypertension -blood pressure improved at this time, will cont current regimen   Next  appt: 2 months with Dr Mariea Clonts, sooner if needed  [place] K. Harle Battiest  [facility] [phone] 8 am - 5 pm) [phone] (after hours)

## 2017-08-08 NOTE — Patient Instructions (Addendum)
Increase lasix to 40 mg by mouth twice daily for 3 days then resume 40 mg daily.  Cont to weigh daily, may need to follow up with cardiologist if weight continues to trend up Low sodium diet 1200 CC diet.

## 2017-08-09 ENCOUNTER — Telehealth: Payer: Self-pay | Admitting: *Deleted

## 2017-08-09 ENCOUNTER — Other Ambulatory Visit: Payer: Self-pay | Admitting: Internal Medicine

## 2017-08-09 DIAGNOSIS — R21 Rash and other nonspecific skin eruption: Secondary | ICD-10-CM

## 2017-08-09 DIAGNOSIS — M6281 Muscle weakness (generalized): Secondary | ICD-10-CM | POA: Diagnosis not present

## 2017-08-09 DIAGNOSIS — E1122 Type 2 diabetes mellitus with diabetic chronic kidney disease: Secondary | ICD-10-CM | POA: Diagnosis not present

## 2017-08-09 DIAGNOSIS — R69 Illness, unspecified: Secondary | ICD-10-CM | POA: Diagnosis not present

## 2017-08-09 DIAGNOSIS — Z85038 Personal history of other malignant neoplasm of large intestine: Secondary | ICD-10-CM | POA: Diagnosis not present

## 2017-08-09 DIAGNOSIS — I5022 Chronic systolic (congestive) heart failure: Secondary | ICD-10-CM | POA: Diagnosis not present

## 2017-08-09 DIAGNOSIS — N183 Chronic kidney disease, stage 3 (moderate): Secondary | ICD-10-CM | POA: Diagnosis not present

## 2017-08-09 DIAGNOSIS — R278 Other lack of coordination: Secondary | ICD-10-CM | POA: Diagnosis not present

## 2017-08-09 DIAGNOSIS — I482 Chronic atrial fibrillation: Secondary | ICD-10-CM | POA: Diagnosis not present

## 2017-08-09 DIAGNOSIS — I714 Abdominal aortic aneurysm, without rupture: Secondary | ICD-10-CM | POA: Diagnosis not present

## 2017-08-09 DIAGNOSIS — I1310 Hypertensive heart and chronic kidney disease without heart failure, with stage 1 through stage 4 chronic kidney disease, or unspecified chronic kidney disease: Secondary | ICD-10-CM | POA: Diagnosis not present

## 2017-08-09 MED ORDER — NYSTATIN 100000 UNIT/GM EX CREA: 1.0000 "application " | TOPICAL_CREAM | Freq: Two times a day (BID) | CUTANEOUS | 0 refills | Status: DC

## 2017-08-09 NOTE — Telephone Encounter (Signed)
Please notify pt's daughter that [facility] If there is no improvement in one week, she should be seen so we can better determine what to use for the rash (Routing comment)

## 2017-08-09 NOTE — Telephone Encounter (Signed)
Pt's daughter calling stating pt has a rash or opening in the crease of her buttocks, asking for a cream or something. I asked if she showed this to Jessica yesterday at her OV , per the daughter the pt wasn't complaining of this yesterday. I advised it would be hard to know what to give the patient without seeing what's going on. She asked that I send the message anyway.

## 2017-08-09 NOTE — Telephone Encounter (Signed)
.  left message to have patient return my call.  

## 2017-08-12 NOTE — Telephone Encounter (Signed)
Left message for daughter to return my call, asking how her mother is doing.

## 2017-08-13 ENCOUNTER — Telehealth: Payer: Self-pay

## 2017-08-13 DIAGNOSIS — I1310 Hypertensive heart and chronic kidney disease without heart failure, with stage 1 through stage 4 chronic kidney disease, or unspecified chronic kidney disease: Secondary | ICD-10-CM | POA: Diagnosis not present

## 2017-08-13 DIAGNOSIS — I482 Chronic atrial fibrillation: Secondary | ICD-10-CM | POA: Diagnosis not present

## 2017-08-13 DIAGNOSIS — R278 Other lack of coordination: Secondary | ICD-10-CM | POA: Diagnosis not present

## 2017-08-13 DIAGNOSIS — R69 Illness, unspecified: Secondary | ICD-10-CM | POA: Diagnosis not present

## 2017-08-13 DIAGNOSIS — Z85038 Personal history of other malignant neoplasm of large intestine: Secondary | ICD-10-CM | POA: Diagnosis not present

## 2017-08-13 DIAGNOSIS — M6281 Muscle weakness (generalized): Secondary | ICD-10-CM | POA: Diagnosis not present

## 2017-08-13 DIAGNOSIS — I5022 Chronic systolic (congestive) heart failure: Secondary | ICD-10-CM | POA: Diagnosis not present

## 2017-08-13 DIAGNOSIS — E1122 Type 2 diabetes mellitus with diabetic chronic kidney disease: Secondary | ICD-10-CM | POA: Diagnosis not present

## 2017-08-13 DIAGNOSIS — N183 Chronic kidney disease, stage 3 (moderate): Secondary | ICD-10-CM | POA: Diagnosis not present

## 2017-08-13 DIAGNOSIS — I714 Abdominal aortic aneurysm, without rupture: Secondary | ICD-10-CM | POA: Diagnosis not present

## 2017-08-13 NOTE — Telephone Encounter (Signed)
Langley Gauss was calling to confirm b/p medication change to Bystolic (confirmed). Langley Gauss is also concerned about long-term plan with medication for patient was unable to afford rx (over 100 dollars) and samples was provided.  Langley Gauss questions once patient runs out of samples what is the plan  Langley Gauss also asked for verbal orders to see patient 2 more times, verbal order provided  Please advise

## 2017-08-13 NOTE — Telephone Encounter (Signed)
Pt has been seen several times recently.  I'm not sure why we are addressing it with phone calls now.  Please ask me about it when I'm in the office

## 2017-08-14 DIAGNOSIS — I5022 Chronic systolic (congestive) heart failure: Secondary | ICD-10-CM | POA: Diagnosis not present

## 2017-08-14 DIAGNOSIS — N183 Chronic kidney disease, stage 3 (moderate): Secondary | ICD-10-CM | POA: Diagnosis not present

## 2017-08-14 DIAGNOSIS — I1310 Hypertensive heart and chronic kidney disease without heart failure, with stage 1 through stage 4 chronic kidney disease, or unspecified chronic kidney disease: Secondary | ICD-10-CM | POA: Diagnosis not present

## 2017-08-14 DIAGNOSIS — M6281 Muscle weakness (generalized): Secondary | ICD-10-CM | POA: Diagnosis not present

## 2017-08-14 DIAGNOSIS — I482 Chronic atrial fibrillation: Secondary | ICD-10-CM | POA: Diagnosis not present

## 2017-08-14 DIAGNOSIS — I714 Abdominal aortic aneurysm, without rupture: Secondary | ICD-10-CM | POA: Diagnosis not present

## 2017-08-14 DIAGNOSIS — Z85038 Personal history of other malignant neoplasm of large intestine: Secondary | ICD-10-CM | POA: Diagnosis not present

## 2017-08-14 DIAGNOSIS — R69 Illness, unspecified: Secondary | ICD-10-CM | POA: Diagnosis not present

## 2017-08-14 DIAGNOSIS — E1122 Type 2 diabetes mellitus with diabetic chronic kidney disease: Secondary | ICD-10-CM | POA: Diagnosis not present

## 2017-08-14 DIAGNOSIS — R278 Other lack of coordination: Secondary | ICD-10-CM | POA: Diagnosis not present

## 2017-08-14 NOTE — Telephone Encounter (Signed)
Noted, you will address when in office, if patient's family or home health calls we will inform them that a response will be provided on Thursday 08/15/17.  Forwarded to [place] in Clinical Intake as a FYI, if someone calls requesting status of message

## 2017-08-16 DIAGNOSIS — R69 Illness, unspecified: Secondary | ICD-10-CM | POA: Diagnosis not present

## 2017-08-16 DIAGNOSIS — R278 Other lack of coordination: Secondary | ICD-10-CM | POA: Diagnosis not present

## 2017-08-16 DIAGNOSIS — I1310 Hypertensive heart and chronic kidney disease without heart failure, with stage 1 through stage 4 chronic kidney disease, or unspecified chronic kidney disease: Secondary | ICD-10-CM | POA: Diagnosis not present

## 2017-08-16 DIAGNOSIS — N183 Chronic kidney disease, stage 3 (moderate): Secondary | ICD-10-CM | POA: Diagnosis not present

## 2017-08-16 DIAGNOSIS — M6281 Muscle weakness (generalized): Secondary | ICD-10-CM | POA: Diagnosis not present

## 2017-08-16 DIAGNOSIS — Z85038 Personal history of other malignant neoplasm of large intestine: Secondary | ICD-10-CM | POA: Diagnosis not present

## 2017-08-16 DIAGNOSIS — I5022 Chronic systolic (congestive) heart failure: Secondary | ICD-10-CM | POA: Diagnosis not present

## 2017-08-16 DIAGNOSIS — I482 Chronic atrial fibrillation: Secondary | ICD-10-CM | POA: Diagnosis not present

## 2017-08-16 DIAGNOSIS — E1122 Type 2 diabetes mellitus with diabetic chronic kidney disease: Secondary | ICD-10-CM | POA: Diagnosis not present

## 2017-08-16 DIAGNOSIS — I714 Abdominal aortic aneurysm, without rupture: Secondary | ICD-10-CM | POA: Diagnosis not present

## 2017-08-19 ENCOUNTER — Other Ambulatory Visit: Payer: Self-pay | Admitting: *Deleted

## 2017-08-19 ENCOUNTER — Telehealth: Payer: Self-pay | Admitting: *Deleted

## 2017-08-19 DIAGNOSIS — I10 Essential (primary) hypertension: Secondary | ICD-10-CM

## 2017-08-19 DIAGNOSIS — I5032 Chronic diastolic (congestive) heart failure: Secondary | ICD-10-CM

## 2017-08-19 DIAGNOSIS — I482 Chronic atrial fibrillation, unspecified: Secondary | ICD-10-CM

## 2017-08-19 MED ORDER — DILTIAZEM HCL ER COATED BEADS 120 MG PO CP24: 120.0000 mg | ORAL_CAPSULE | Freq: Every day | ORAL | 1 refills | Status: DC

## 2017-08-19 NOTE — Telephone Encounter (Signed)
[phone]

## 2017-08-19 NOTE — Telephone Encounter (Signed)
Received fax from [facility] [phone] for a CMN for Oxygen. Filled out and placed in Dr. Cyndi Lennert folder to review and sign.  To be faxed back to [facility] Fax:[phone]

## 2017-08-20 ENCOUNTER — Other Ambulatory Visit: Payer: Self-pay | Admitting: Licensed Clinical Social Worker

## 2017-08-20 NOTE — Patient Outreach (Signed)
[facility]) Care Management  08/20/2017  Nicole [name] 1931/01/15 [id number]  Assessment- [facility] CSW has made several unsuccessful outreach calls and has been unable to maintain contact with patient. CSW mailed patient an outreach barrier letter to her residence and waited over 10 business days to hear back without success. CSW will complete case closure at this time.  Plan-CSW will complete case closure and will update PCP and [facility] Care Management Assistant.  Eula Fried, BSW, MSW, [email or web address] Phone: [phone] Fax: [phone]

## 2017-08-21 ENCOUNTER — Other Ambulatory Visit: Payer: Self-pay

## 2017-08-21 ENCOUNTER — Other Ambulatory Visit: Payer: Self-pay | Admitting: Internal Medicine

## 2017-08-21 DIAGNOSIS — N183 Chronic kidney disease, stage 3 (moderate): Secondary | ICD-10-CM | POA: Diagnosis not present

## 2017-08-21 DIAGNOSIS — R69 Illness, unspecified: Secondary | ICD-10-CM | POA: Diagnosis not present

## 2017-08-21 DIAGNOSIS — I482 Chronic atrial fibrillation: Secondary | ICD-10-CM | POA: Diagnosis not present

## 2017-08-21 DIAGNOSIS — I5022 Chronic systolic (congestive) heart failure: Secondary | ICD-10-CM | POA: Diagnosis not present

## 2017-08-21 DIAGNOSIS — I1310 Hypertensive heart and chronic kidney disease without heart failure, with stage 1 through stage 4 chronic kidney disease, or unspecified chronic kidney disease: Secondary | ICD-10-CM | POA: Diagnosis not present

## 2017-08-21 DIAGNOSIS — Z85038 Personal history of other malignant neoplasm of large intestine: Secondary | ICD-10-CM | POA: Diagnosis not present

## 2017-08-21 DIAGNOSIS — M6281 Muscle weakness (generalized): Secondary | ICD-10-CM | POA: Diagnosis not present

## 2017-08-21 DIAGNOSIS — E1122 Type 2 diabetes mellitus with diabetic chronic kidney disease: Secondary | ICD-10-CM | POA: Diagnosis not present

## 2017-08-21 DIAGNOSIS — I714 Abdominal aortic aneurysm, without rupture: Secondary | ICD-10-CM | POA: Diagnosis not present

## 2017-08-21 DIAGNOSIS — R278 Other lack of coordination: Secondary | ICD-10-CM | POA: Diagnosis not present

## 2017-08-21 MED ORDER — ALLOPURINOL 100 MG PO TABS: 100.0000 mg | ORAL_TABLET | Freq: Every day | ORAL | 1 refills | Status: DC

## 2017-08-21 NOTE — Telephone Encounter (Signed)
Left message on voicemail for patient to return call when available, reason for call:  [place] location, [facility]

## 2017-08-23 DIAGNOSIS — Z85038 Personal history of other malignant neoplasm of large intestine: Secondary | ICD-10-CM | POA: Diagnosis not present

## 2017-08-23 DIAGNOSIS — M6281 Muscle weakness (generalized): Secondary | ICD-10-CM | POA: Diagnosis not present

## 2017-08-23 DIAGNOSIS — I5022 Chronic systolic (congestive) heart failure: Secondary | ICD-10-CM | POA: Diagnosis not present

## 2017-08-23 DIAGNOSIS — N183 Chronic kidney disease, stage 3 (moderate): Secondary | ICD-10-CM | POA: Diagnosis not present

## 2017-08-23 DIAGNOSIS — E1122 Type 2 diabetes mellitus with diabetic chronic kidney disease: Secondary | ICD-10-CM | POA: Diagnosis not present

## 2017-08-23 DIAGNOSIS — I482 Chronic atrial fibrillation: Secondary | ICD-10-CM | POA: Diagnosis not present

## 2017-08-23 DIAGNOSIS — R278 Other lack of coordination: Secondary | ICD-10-CM | POA: Diagnosis not present

## 2017-08-23 DIAGNOSIS — R69 Illness, unspecified: Secondary | ICD-10-CM | POA: Diagnosis not present

## 2017-08-23 DIAGNOSIS — I1310 Hypertensive heart and chronic kidney disease without heart failure, with stage 1 through stage 4 chronic kidney disease, or unspecified chronic kidney disease: Secondary | ICD-10-CM | POA: Diagnosis not present

## 2017-08-23 DIAGNOSIS — I714 Abdominal aortic aneurysm, without rupture: Secondary | ICD-10-CM | POA: Diagnosis not present

## 2017-08-26 DIAGNOSIS — I482 Chronic atrial fibrillation: Secondary | ICD-10-CM | POA: Diagnosis not present

## 2017-08-26 DIAGNOSIS — I1310 Hypertensive heart and chronic kidney disease without heart failure, with stage 1 through stage 4 chronic kidney disease, or unspecified chronic kidney disease: Secondary | ICD-10-CM | POA: Diagnosis not present

## 2017-08-26 DIAGNOSIS — I714 Abdominal aortic aneurysm, without rupture: Secondary | ICD-10-CM | POA: Diagnosis not present

## 2017-08-26 DIAGNOSIS — Z85038 Personal history of other malignant neoplasm of large intestine: Secondary | ICD-10-CM | POA: Diagnosis not present

## 2017-08-26 DIAGNOSIS — R278 Other lack of coordination: Secondary | ICD-10-CM | POA: Diagnosis not present

## 2017-08-26 DIAGNOSIS — E1122 Type 2 diabetes mellitus with diabetic chronic kidney disease: Secondary | ICD-10-CM | POA: Diagnosis not present

## 2017-08-26 DIAGNOSIS — N183 Chronic kidney disease, stage 3 (moderate): Secondary | ICD-10-CM | POA: Diagnosis not present

## 2017-08-26 DIAGNOSIS — M6281 Muscle weakness (generalized): Secondary | ICD-10-CM | POA: Diagnosis not present

## 2017-08-26 DIAGNOSIS — I5022 Chronic systolic (congestive) heart failure: Secondary | ICD-10-CM | POA: Diagnosis not present

## 2017-08-26 DIAGNOSIS — R69 Illness, unspecified: Secondary | ICD-10-CM | POA: Diagnosis not present

## 2017-08-26 NOTE — Telephone Encounter (Signed)
Call handled...cdavis

## 2017-08-28 ENCOUNTER — Other Ambulatory Visit: Payer: Self-pay | Admitting: Internal Medicine

## 2017-08-28 DIAGNOSIS — R0602 Shortness of breath: Secondary | ICD-10-CM | POA: Diagnosis not present

## 2017-08-28 DIAGNOSIS — J189 Pneumonia, unspecified organism: Secondary | ICD-10-CM | POA: Diagnosis not present

## 2017-08-28 DIAGNOSIS — I110 Hypertensive heart disease with heart failure: Secondary | ICD-10-CM

## 2017-08-28 DIAGNOSIS — I5032 Chronic diastolic (congestive) heart failure: Secondary | ICD-10-CM | POA: Diagnosis not present

## 2017-08-28 DIAGNOSIS — M6281 Muscle weakness (generalized): Secondary | ICD-10-CM | POA: Diagnosis not present

## 2017-08-28 DIAGNOSIS — I482 Chronic atrial fibrillation, unspecified: Secondary | ICD-10-CM

## 2017-08-28 DIAGNOSIS — R69 Illness, unspecified: Secondary | ICD-10-CM | POA: Diagnosis not present

## 2017-08-28 DIAGNOSIS — M109 Gout, unspecified: Secondary | ICD-10-CM | POA: Diagnosis not present

## 2017-08-28 DIAGNOSIS — I129 Hypertensive chronic kidney disease with stage 1 through stage 4 chronic kidney disease, or unspecified chronic kidney disease: Secondary | ICD-10-CM | POA: Diagnosis not present

## 2017-08-28 DIAGNOSIS — I5030 Unspecified diastolic (congestive) heart failure: Principal | ICD-10-CM

## 2017-08-28 MED ORDER — METOPROLOL SUCCINATE ER 25 MG PO TB24: 25.0000 mg | ORAL_TABLET | Freq: Every day | ORAL | 3 refills | Status: DC

## 2017-08-28 NOTE — Telephone Encounter (Signed)
Open message x 12 business days   Message never responded to as of 08/28/17, will reroute to [employer] L, DO, please advise.   Pending appointment 10/17/17

## 2017-08-28 NOTE — Telephone Encounter (Signed)
Give more samples of bystolic 10mg  daily until mail order shipment of new prescription for toprol xl 25mg  po daily comes from pharmacy.  Unfortunately, that was the one med that was actually controlling her blood pressure.  We'll see what happens with this new pill.

## 2017-08-28 NOTE — Telephone Encounter (Signed)
Spoke with Lewis Moccasin aware of Dr.[name]'s treatment plan. Samples of Bystolic placed at the front desk for pick-up.  I also called Langley Gauss with [facility] with a status update. Langley Gauss will see patient x 1 more visit.

## 2017-08-29 DIAGNOSIS — M6281 Muscle weakness (generalized): Secondary | ICD-10-CM | POA: Diagnosis not present

## 2017-08-29 DIAGNOSIS — N183 Chronic kidney disease, stage 3 (moderate): Secondary | ICD-10-CM | POA: Diagnosis not present

## 2017-08-29 DIAGNOSIS — I714 Abdominal aortic aneurysm, without rupture: Secondary | ICD-10-CM | POA: Diagnosis not present

## 2017-08-29 DIAGNOSIS — I1310 Hypertensive heart and chronic kidney disease without heart failure, with stage 1 through stage 4 chronic kidney disease, or unspecified chronic kidney disease: Secondary | ICD-10-CM | POA: Diagnosis not present

## 2017-08-29 DIAGNOSIS — R278 Other lack of coordination: Secondary | ICD-10-CM | POA: Diagnosis not present

## 2017-08-29 DIAGNOSIS — Z85038 Personal history of other malignant neoplasm of large intestine: Secondary | ICD-10-CM | POA: Diagnosis not present

## 2017-08-29 DIAGNOSIS — I482 Chronic atrial fibrillation: Secondary | ICD-10-CM | POA: Diagnosis not present

## 2017-08-29 DIAGNOSIS — R69 Illness, unspecified: Secondary | ICD-10-CM | POA: Diagnosis not present

## 2017-08-29 DIAGNOSIS — E1122 Type 2 diabetes mellitus with diabetic chronic kidney disease: Secondary | ICD-10-CM | POA: Diagnosis not present

## 2017-08-29 DIAGNOSIS — I5022 Chronic systolic (congestive) heart failure: Secondary | ICD-10-CM | POA: Diagnosis not present

## 2017-09-25 ENCOUNTER — Other Ambulatory Visit: Payer: Self-pay | Admitting: Internal Medicine

## 2017-09-25 DIAGNOSIS — R69 Illness, unspecified: Secondary | ICD-10-CM | POA: Diagnosis not present

## 2017-09-25 DIAGNOSIS — M6281 Muscle weakness (generalized): Secondary | ICD-10-CM | POA: Diagnosis not present

## 2017-09-25 DIAGNOSIS — M109 Gout, unspecified: Secondary | ICD-10-CM | POA: Diagnosis not present

## 2017-09-25 DIAGNOSIS — J189 Pneumonia, unspecified organism: Secondary | ICD-10-CM | POA: Diagnosis not present

## 2017-09-25 DIAGNOSIS — R0602 Shortness of breath: Secondary | ICD-10-CM | POA: Diagnosis not present

## 2017-09-25 DIAGNOSIS — I129 Hypertensive chronic kidney disease with stage 1 through stage 4 chronic kidney disease, or unspecified chronic kidney disease: Secondary | ICD-10-CM | POA: Diagnosis not present

## 2017-09-25 DIAGNOSIS — I5032 Chronic diastolic (congestive) heart failure: Secondary | ICD-10-CM | POA: Diagnosis not present

## 2017-09-27 ENCOUNTER — Other Ambulatory Visit: Payer: Self-pay | Admitting: *Deleted

## 2017-09-27 DIAGNOSIS — I5030 Unspecified diastolic (congestive) heart failure: Principal | ICD-10-CM

## 2017-09-27 DIAGNOSIS — I482 Chronic atrial fibrillation, unspecified: Secondary | ICD-10-CM

## 2017-09-27 DIAGNOSIS — I110 Hypertensive heart disease with heart failure: Secondary | ICD-10-CM

## 2017-09-27 MED ORDER — METOPROLOL SUCCINATE ER 25 MG PO TB24: 25.0000 mg | ORAL_TABLET | Freq: Every day | ORAL | 3 refills | Status: DC

## 2017-09-28 DIAGNOSIS — M2241 Chondromalacia patellae, right knee: Secondary | ICD-10-CM | POA: Diagnosis not present

## 2017-09-29 DIAGNOSIS — M2241 Chondromalacia patellae, right knee: Secondary | ICD-10-CM | POA: Diagnosis not present

## 2017-10-09 ENCOUNTER — Emergency Department (HOSPITAL_COMMUNITY): Payer: Medicare HMO

## 2017-10-09 ENCOUNTER — Inpatient Hospital Stay (HOSPITAL_COMMUNITY)
Admission: EM | Admit: 2017-10-09 | Discharge: 2017-10-16 | DRG: 291 | Disposition: A | Discharge status: Skilled Nursing Facility | Payer: Medicare HMO | Attending: Family Medicine | Admitting: Family Medicine

## 2017-10-09 ENCOUNTER — Other Ambulatory Visit: Payer: Self-pay

## 2017-10-09 ENCOUNTER — Encounter (HOSPITAL_COMMUNITY): Payer: Self-pay

## 2017-10-09 DIAGNOSIS — E039 Hypothyroidism, unspecified: Secondary | ICD-10-CM | POA: Diagnosis present

## 2017-10-09 DIAGNOSIS — I5043 Acute on chronic combined systolic (congestive) and diastolic (congestive) heart failure: Secondary | ICD-10-CM | POA: Diagnosis present

## 2017-10-09 DIAGNOSIS — I340 Nonrheumatic mitral (valve) insufficiency: Secondary | ICD-10-CM | POA: Diagnosis present

## 2017-10-09 DIAGNOSIS — R0602 Shortness of breath: Secondary | ICD-10-CM | POA: Diagnosis not present

## 2017-10-09 DIAGNOSIS — I2510 Atherosclerotic heart disease of native coronary artery without angina pectoris: Secondary | ICD-10-CM | POA: Diagnosis present

## 2017-10-09 DIAGNOSIS — Z66 Do not resuscitate: Secondary | ICD-10-CM | POA: Diagnosis present

## 2017-10-09 DIAGNOSIS — Z8673 Personal history of transient ischemic attack (TIA), and cerebral infarction without residual deficits: Secondary | ICD-10-CM

## 2017-10-09 DIAGNOSIS — M79676 Pain in unspecified toe(s): Secondary | ICD-10-CM

## 2017-10-09 DIAGNOSIS — I482 Chronic atrial fibrillation, unspecified: Secondary | ICD-10-CM

## 2017-10-09 DIAGNOSIS — I10 Essential (primary) hypertension: Secondary | ICD-10-CM | POA: Diagnosis not present

## 2017-10-09 DIAGNOSIS — E875 Hyperkalemia: Secondary | ICD-10-CM | POA: Diagnosis not present

## 2017-10-09 DIAGNOSIS — E7800 Pure hypercholesterolemia, unspecified: Secondary | ICD-10-CM | POA: Diagnosis present

## 2017-10-09 DIAGNOSIS — F329 Major depressive disorder, single episode, unspecified: Secondary | ICD-10-CM | POA: Diagnosis present

## 2017-10-09 DIAGNOSIS — T502X5A Adverse effect of carbonic-anhydrase inhibitors, benzothiadiazides and other diuretics, initial encounter: Secondary | ICD-10-CM | POA: Diagnosis not present

## 2017-10-09 DIAGNOSIS — R002 Palpitations: Secondary | ICD-10-CM | POA: Diagnosis not present

## 2017-10-09 DIAGNOSIS — I4892 Unspecified atrial flutter: Secondary | ICD-10-CM | POA: Diagnosis present

## 2017-10-09 DIAGNOSIS — Z79899 Other long term (current) drug therapy: Secondary | ICD-10-CM

## 2017-10-09 DIAGNOSIS — R748 Abnormal levels of other serum enzymes: Secondary | ICD-10-CM | POA: Diagnosis not present

## 2017-10-09 DIAGNOSIS — I701 Atherosclerosis of renal artery: Secondary | ICD-10-CM | POA: Diagnosis present

## 2017-10-09 DIAGNOSIS — I130 Hypertensive heart and chronic kidney disease with heart failure and stage 1 through stage 4 chronic kidney disease, or unspecified chronic kidney disease: Principal | ICD-10-CM | POA: Diagnosis present

## 2017-10-09 DIAGNOSIS — Z85038 Personal history of other malignant neoplasm of large intestine: Secondary | ICD-10-CM

## 2017-10-09 DIAGNOSIS — L8990 Pressure ulcer of unspecified site, unspecified stage: Secondary | ICD-10-CM

## 2017-10-09 DIAGNOSIS — I5032 Chronic diastolic (congestive) heart failure: Secondary | ICD-10-CM

## 2017-10-09 DIAGNOSIS — Z87442 Personal history of urinary calculi: Secondary | ICD-10-CM

## 2017-10-09 DIAGNOSIS — E785 Hyperlipidemia, unspecified: Secondary | ICD-10-CM | POA: Diagnosis present

## 2017-10-09 DIAGNOSIS — M79675 Pain in left toe(s): Secondary | ICD-10-CM | POA: Diagnosis not present

## 2017-10-09 DIAGNOSIS — E1122 Type 2 diabetes mellitus with diabetic chronic kidney disease: Secondary | ICD-10-CM | POA: Diagnosis present

## 2017-10-09 DIAGNOSIS — I4821 Permanent atrial fibrillation: Secondary | ICD-10-CM | POA: Diagnosis present

## 2017-10-09 DIAGNOSIS — N183 Chronic kidney disease, stage 3 (moderate): Secondary | ICD-10-CM | POA: Diagnosis present

## 2017-10-09 DIAGNOSIS — E1142 Type 2 diabetes mellitus with diabetic polyneuropathy: Secondary | ICD-10-CM | POA: Diagnosis not present

## 2017-10-09 DIAGNOSIS — R778 Other specified abnormalities of plasma proteins: Secondary | ICD-10-CM | POA: Diagnosis present

## 2017-10-09 DIAGNOSIS — Z8679 Personal history of other diseases of the circulatory system: Secondary | ICD-10-CM

## 2017-10-09 DIAGNOSIS — I481 Persistent atrial fibrillation: Secondary | ICD-10-CM | POA: Diagnosis present

## 2017-10-09 DIAGNOSIS — K219 Gastro-esophageal reflux disease without esophagitis: Secondary | ICD-10-CM | POA: Diagnosis present

## 2017-10-09 DIAGNOSIS — I4891 Unspecified atrial fibrillation: Secondary | ICD-10-CM

## 2017-10-09 DIAGNOSIS — I272 Pulmonary hypertension, unspecified: Secondary | ICD-10-CM | POA: Diagnosis present

## 2017-10-09 DIAGNOSIS — R069 Unspecified abnormalities of breathing: Secondary | ICD-10-CM | POA: Diagnosis not present

## 2017-10-09 DIAGNOSIS — R69 Illness, unspecified: Secondary | ICD-10-CM | POA: Diagnosis not present

## 2017-10-09 DIAGNOSIS — I5033 Acute on chronic diastolic (congestive) heart failure: Secondary | ICD-10-CM | POA: Diagnosis not present

## 2017-10-09 DIAGNOSIS — J810 Acute pulmonary edema: Secondary | ICD-10-CM | POA: Diagnosis not present

## 2017-10-09 DIAGNOSIS — I5031 Acute diastolic (congestive) heart failure: Secondary | ICD-10-CM | POA: Diagnosis present

## 2017-10-09 DIAGNOSIS — M109 Gout, unspecified: Secondary | ICD-10-CM | POA: Diagnosis not present

## 2017-10-09 DIAGNOSIS — Z91041 Radiographic dye allergy status: Secondary | ICD-10-CM

## 2017-10-09 DIAGNOSIS — L89151 Pressure ulcer of sacral region, stage 1: Secondary | ICD-10-CM | POA: Diagnosis present

## 2017-10-09 DIAGNOSIS — F0390 Unspecified dementia without behavioral disturbance: Secondary | ICD-10-CM | POA: Diagnosis present

## 2017-10-09 DIAGNOSIS — Z79890 Hormone replacement therapy (postmenopausal): Secondary | ICD-10-CM

## 2017-10-09 DIAGNOSIS — Z833 Family history of diabetes mellitus: Secondary | ICD-10-CM

## 2017-10-09 DIAGNOSIS — Z8249 Family history of ischemic heart disease and other diseases of the circulatory system: Secondary | ICD-10-CM

## 2017-10-09 DIAGNOSIS — E876 Hypokalemia: Secondary | ICD-10-CM | POA: Diagnosis not present

## 2017-10-09 DIAGNOSIS — J9610 Chronic respiratory failure, unspecified whether with hypoxia or hypercapnia: Secondary | ICD-10-CM | POA: Diagnosis present

## 2017-10-09 DIAGNOSIS — R7989 Other specified abnormal findings of blood chemistry: Secondary | ICD-10-CM

## 2017-10-09 DIAGNOSIS — I712 Thoracic aortic aneurysm, without rupture: Secondary | ICD-10-CM | POA: Diagnosis present

## 2017-10-09 DIAGNOSIS — Z881 Allergy status to other antibiotic agents status: Secondary | ICD-10-CM

## 2017-10-09 LAB — CBC WITH DIFFERENTIAL/PLATELET
BASOS PCT: 0 %
Basophils Absolute: 0 10*3/uL (ref 0.0–0.1)
EOS ABS: 0 10*3/uL (ref 0.0–0.7)
EOS PCT: 0 %
HCT: 42.1 % (ref 36.0–46.0)
HEMOGLOBIN: 14.6 g/dL (ref 12.0–15.0)
Lymphocytes Relative: 13 %
Lymphs Abs: 0.7 10*3/uL (ref 0.7–4.0)
MCH: 27.4 pg (ref 26.0–34.0)
MCHC: 34.7 g/dL (ref 30.0–36.0)
MCV: 79.1 fL (ref 78.0–100.0)
Monocytes Absolute: 0.3 10*3/uL (ref 0.1–1.0)
Monocytes Relative: 6 %
NEUTROS PCT: 81 %
Neutro Abs: 4.4 10*3/uL (ref 1.7–7.7)
PLATELETS: 155 10*3/uL (ref 150–400)
RBC: 5.32 MIL/uL — AB (ref 3.87–5.11)
RDW: 18.3 % — ABNORMAL HIGH (ref 11.5–15.5)
WBC: 5.4 10*3/uL (ref 4.0–10.5)

## 2017-10-09 LAB — BASIC METABOLIC PANEL
Anion gap: 14 (ref 5–15)
BUN: 11 mg/dL (ref 6–20)
CHLORIDE: 96 mmol/L — AB (ref 101–111)
CO2: 27 mmol/L (ref 22–32)
CREATININE: 1.08 mg/dL — AB (ref 0.44–1.00)
Calcium: 7.7 mg/dL — ABNORMAL LOW (ref 8.9–10.3)
GFR, EST AFRICAN AMERICAN: 53 mL/min — AB (ref >60)
GFR, EST NON AFRICAN AMERICAN: 46 mL/min — AB (ref >60)
Glucose, Bld: 102 mg/dL — ABNORMAL HIGH (ref 65–99)
Potassium: 5.5 mmol/L — ABNORMAL HIGH (ref 3.5–5.1)
SODIUM: 137 mmol/L (ref 135–145)

## 2017-10-09 LAB — BRAIN NATRIURETIC PEPTIDE: B NATRIURETIC PEPTIDE 5: 892.5 pg/mL — AB (ref 0.0–100.0)

## 2017-10-09 LAB — TROPONIN I: TROPONIN I: 0.04 ng/mL — AB (ref <0.03)

## 2017-10-09 MED ORDER — DILTIAZEM LOAD VIA INFUSION
10.0000 mg | Freq: Once | INTRAVENOUS | Status: AC
  Administered 2017-10-09: 10 mg via INTRAVENOUS
  Filled 2017-10-09: qty 10

## 2017-10-09 MED ORDER — DILTIAZEM HCL-DEXTROSE 100-5 MG/100ML-% IV SOLN (PREMIX)
5.0000 mg/h | INTRAVENOUS | Status: DC
  Administered 2017-10-09: 5 mg/h via INTRAVENOUS
  Filled 2017-10-09: qty 100

## 2017-10-09 MED ORDER — FUROSEMIDE 10 MG/ML IJ SOLN
40.0000 mg | INTRAMUSCULAR | Status: AC
Start: 2017-10-09 — End: 2017-10-09
  Administered 2017-10-09: 40 mg via INTRAVENOUS
  Filled 2017-10-09: qty 4

## 2017-10-09 NOTE — ED Notes (Signed)
Please contact dtr [name] at [phone], Cg Rickey Aiman at [phone], or Mr.Rushie Goltz [phone]  w/ any updates

## 2017-10-09 NOTE — ED Provider Notes (Signed)
[facility] EMERGENCY DEPARTMENT Provider Note   CSN: [id number] Arrival date & time: 10/09/17  1951     History   Chief Complaint Chief Complaint  Patient presents with  . Shortness of Breath    HPI Nicole [name] is a 84 y.o. female.  The history is provided by the patient and medical records.     84 year old female with history of AAA, anemia, anxiety, A. fib, congestive heart failure, chronic kidney disease, depression, diabetes, GERD, hyperlipidemia, hypothyroidism, presenting to the ED with shortness of breath.  Patient states she has been feeling unwell for about 2 days now.  States she has felt short of breath, having palpitations, and somewhat lightheaded.  Today around 4 PM breathing seemed to become more labored so family called EMS to transport her here.  Saturations fluctuating between 94-100% with EMS.  Patient does have home oxygen that she has been using lately, however family reports there is a component of dementia and sometimes she forgets to aware it.  Patient does have history of A. fib, states she is in it most of the time but occasionally will pop out of it.  States she has been compliant with all of her medications.  She is no longer on anticoagulation due to issues with ongoing bleeding.  She denies any chest pain, nausea, vomiting, abdominal pain, fever, chills, cough, or other upper respiratory symptoms.  Past Medical History:  Diagnosis Date  . AAA (abdominal aortic aneurysm) ([place])   . Anemia   . Anxiety   . Arthritis   . Atrial fibrillation ([place])   . Cervicalgia   . Cholelithiasis   . Chronic systolic heart failure ([place])   . CKD (chronic kidney disease)   . Colon cancer ([place]) 07/1997  . Depression   . Diabetes mellitus ([place])   . Dizziness and giddiness   . Electrolyte and fluid disorders not elsewhere classified   . GERD (gastroesophageal reflux disease)   . Gout, unspecified   . Hemorrhage of rectum and anus   .  Hypercholesteremia   . Hypertension   . Hypoglycemia, unspecified   . Hypopotassemia   . Hypothyroidism   . Kidney stone    renal calculi  . Other chronic allergic conjunctivitis   . Other specified cardiac dysrhythmias(427.89)   . Pain in joint, lower leg   . Perforation of bile duct   . Proteinuria   . Shortness of breath   . Small bowel obstruction due to adhesions ([place]) 05/16/2012  . Stroke ([place])   . Swelling, mass, or lump in head and neck   . Thoracic aneurysm without mention of rupture    4.6 cm Asc Aortic aneurysm, CT 07/2015    Patient Active Problem List   Diagnosis Date Noted  . Stroke ([place])   . Shortness of breath   . Proteinuria   . Perforation of bile duct   . Pain in joint, lower leg   . Other chronic allergic conjunctivitis   . Kidney stone   . Hypothyroidism   . Hypopotassemia   . Hypoglycemia, unspecified   . Hypertension   . Hypercholesteremia   . Hemorrhage of rectum and anus   . Gout, unspecified   . GERD (gastroesophageal reflux disease)   . Dizziness and giddiness   . Diabetes mellitus ([place])   . Depression   . CKD (chronic kidney disease)   . Chronic systolic heart failure ([place])   . Cholelithiasis   . Cervicalgia   . Atrial  fibrillation ([place])   . Arthritis   . Anxiety   . AAA (abdominal aortic aneurysm) ([place])   . Moderate protein-calorie malnutrition ([place]) 07/11/2017  . Major depression 05/28/2017  . Adult failure to thrive 05/28/2017  . Acute encephalopathy 05/23/2017  . Acute lower UTI 05/23/2017  . Palliative care by specialist   . DNR (do not resuscitate)   . Bradyarrhythmia 01/11/2017  . Generalized weakness 01/11/2017  . General weakness 01/11/2017  . PE (pulmonary thromboembolism) ([place]) 08/24/2016  . Accelerated hypertension with diastolic congestive heart failure, NYHA class 3 ([place]) 01/05/2016  . Permanent atrial fibrillation ([place]) 01/05/2016  . Rib pain on right side 10/14/2015  . Hypothyroidism due to acquired atrophy of  thyroid 10/14/2015  . Type 2 diabetes mellitus with diabetic nephropathy, without long-term current use of insulin ([place]) 10/14/2015  . Essential hypertension 10/14/2015  . Acute on chronic systolic heart failure ([place]) 08/02/2015  . Chronic diastolic heart failure ([place]) 08/02/2015  . Bradycardia 07/27/2015  . Chronic anemia 07/27/2015  . Chest pain   . Vascular dementia 06/13/2015  . Memory loss 06/13/2015  . Left hemiparesis ([place])   . Type 2 diabetes mellitus with peripheral neuropathy (HCC)   . ICH (intracerebral hemorrhage) ([place]) 04/04/2015  . Atrial fibrillation, chronic ([place])   . Hypertensive urgency   . SOB (shortness of breath) 04/03/2014  . Diabetic nephropathy ([place]) 09/07/2013  . Chronic constipation 03/25/2013  . Carpal tunnel syndrome 02/25/2013  . Hypokalemia 05/17/2012  . Small bowel obstruction due to adhesions ([place]) 05/16/2012  . Normal coronary arteries, 2008 05/15/2012  . Type II or unspecified type diabetes mellitus with neurological manifestations, not stated as uncontrolled(250.60) 05/15/2012  . Acute respiratory distress 05/15/2012  . Abdominal pain, acute, right upper quadrant 05/12/2012  . Cholecystitis chronic, acute, S/P lap cholecystectomy 05/14/12 05/12/2012  . Thoracic aortic aneurysm without rupture ([place])   . CHF, past NICM with an EF of 30%, most recent echo 2011 showed EF to be45- 50%   . Anemia 08/09/2011  . History of colon cancer, stage III 08/09/2011  . Colon cancer ([place]) 07/23/1997    Past Surgical History:  Procedure Laterality Date  . CARDIAC CATHETERIZATION  2008   moderate severe pulm HTN; with elevted pulm capillary wedge pressure   . CHOLECYSTECTOMY  05/14/2012   Procedure: LAPAROSCOPIC CHOLECYSTECTOMY WITH INTRAOPERATIVE CHOLANGIOGRAM;  Surgeon: Haywood Lasso, MD;  Location: [facility];  Service: General;  Laterality: N/A;  . ERCP  05/17/2012   Procedure: ENDOSCOPIC RETROGRADE CHOLANGIOPANCREATOGRAPHY (ERCP);  Surgeon: Missy Sabins,  MD;  Location: [facility];  Service: Gastroenterology;  Laterality: N/A;  . ERCP  08/12/2012   Procedure: ENDOSCOPIC RETROGRADE CHOLANGIOPANCREATOGRAPHY (ERCP);  Surgeon: Missy Sabins, MD;  Location: [facility] ENDOSCOPY;  Service: Endoscopy;  Laterality: N/A;  . HEMICOLECTOMY  08/11/1997  . KIDNEY STONE SURGERY    . TRANSTHORACIC ECHOCARDIOGRAM  11/2009   EF 45-50%, mild-mod AV regurg; mild MR; mod TR; ascending aorta mildly dilated    OB History    No data available       Home Medications    Prior to Admission medications   Medication Sig Start Date End Date Taking? Authorizing Provider  allopurinol (ZYLOPRIM) 100 MG tablet Take 1 tablet (100 mg total) by mouth daily. 08/21/17   [name], [name], DO  diltiazem (CARDIZEM CD) 120 MG 24 hr capsule Take 1 capsule (120 mg total) by mouth daily. 08/19/17   [name], [name], DO  donepezil (ARICEPT) 10 MG tablet Take one tablet  by mouth once daily at bedtime 08/08/17   Lauree Chandler, NP  ferrous sulfate 325 (65 FE) MG EC tablet Take 1 tablet (325 mg total) by mouth 2 (two) times daily with a meal. 08/08/17   Lauree Chandler, NP  furosemide (LASIX) 40 MG tablet Take 1 tablet (40 mg total) by mouth daily. 08/08/17   Lauree Chandler, NP  hydrALAZINE (APRESOLINE) 25 MG tablet TAKE 3 TABLETS 3 TIMES A DAY 09/25/17   [name], [name], DO  ipratropium-albuterol (DUONEB) 0.5-2.5 (3) MG/3ML SOLN Take 3 mLs by nebulization every 6 (six) hours as needed (SOB/wheezing).    [provider]  isosorbide mononitrate (IMDUR) 30 MG 24 hr tablet Take 1 tablet (30 mg total) by mouth 2 (two) times daily. 08/08/17   Lauree Chandler, NP  labetalol (NORMODYNE) 300 MG tablet Take 1 tablet (300 mg total) by mouth 2 (two) times daily. 08/08/17 09/07/17  Lauree Chandler, NP  levothyroxine (SYNTHROID, LEVOTHROID) 25 MCG tablet Take 25 mcg by mouth daily before breakfast.    [provider]  metoprolol succinate (TOPROL-XL) 25 MG 24 hr tablet Take 1 tablet (25 mg  total) by mouth daily. 09/27/17   [name], [name], DO  NUTRITIONAL SUPPLEMENT LIQD Take 120 mLs by mouth 2 (two) times daily.    [provider]  nystatin cream (MYCOSTATIN) Apply 1 application topically 2 (two) times daily. To gluteal crease 08/09/17   [name], [name], DO  OXYGEN Inhale 2 L into the lungs as needed.    [provider]  potassium chloride (K-DUR) 10 MEQ tablet Take 2 tablets (20 mEq total) by mouth daily. 07/29/17   [name], [name], DO  sertraline (ZOLOFT) 50 MG tablet Take 1 tablet (50 mg total) by mouth daily. 07/29/17   [name], DO    Family History Family History  Problem Relation Age of Onset  . Heart disease Mother   . Stroke Father   . Hypertension Daughter   . Hypertension Son   . Diabetes Sister   . Hypertension Son     Social History Social History   Tobacco Use  . Smoking status: Never Smoker  . Smokeless tobacco: Never Used  Substance Use Topics  . Alcohol use: No  . Drug use: No     Allergies   Iohexol; Iodinated diagnostic agents; and Z-pak [azithromycin]   Review of Systems Review of Systems  Respiratory: Positive for shortness of breath.   Cardiovascular: Positive for palpitations.  All other systems reviewed and are negative.    Physical Exam Updated Vital Signs BP (!) 166/110   Pulse 82   Temp 97.6 F (36.4 C) (Oral)   Resp 20   SpO2 98%   Physical Exam  Constitutional: She is oriented to person, place, and time. She appears well-developed and well-nourished.  Elderly, appears fatigued  HENT:  Head: Normocephalic and atraumatic.  Mouth/Throat: Oropharynx is clear and moist.  Eyes: Conjunctivae and EOM are normal. Pupils are equal, round, and reactive to light.  Neck: Normal range of motion.  Cardiovascular: Normal heart sounds. An irregularly irregular rhythm present. Tachycardia present.  Irregularly irregular rhythm, rate 115-140's during exam  Pulmonary/Chest: Effort normal. She has rales.    Breathing appears somewhat labored, she is still able to speak in sentences during exam, some rales noted  Abdominal: Soft. Bowel sounds are normal.  Musculoskeletal: Normal range of motion.  Neurological: She is alert and oriented to person, place, and time.  Skin:  Skin is warm and dry.  Psychiatric: She has a normal mood and affect.  Nursing note and vitals reviewed.    ED Treatments / Results  Labs (all labs ordered are listed, but only abnormal results are displayed) Labs Reviewed  CBC WITH DIFFERENTIAL/PLATELET - Abnormal; Notable for the following components:      Result Value   RBC 5.32 (*)    RDW 18.3 (*)    All other components within normal limits  BASIC METABOLIC PANEL - Abnormal; Notable for the following components:   Potassium 5.5 (*)    Chloride 96 (*)    Glucose, Bld 102 (*)    Creatinine, Ser 1.08 (*)    Calcium 7.7 (*)    GFR calc non Af Amer 46 (*)    GFR calc Af Amer 53 (*)    All other components within normal limits  BRAIN NATRIURETIC PEPTIDE - Abnormal; Notable for the following components:   B Natriuretic Peptide 892.5 (*)    All other components within normal limits  TROPONIN I - Abnormal; Notable for the following components:   Troponin I 0.04 (*)    All other components within normal limits    EKG ED ECG REPORT   Date: 10/10/2017  Rate: 98  Rhythm: atrial fibrillation  QRS Axis: normal  Intervals: undetermined  ST/T Wave abnormalities: undetermined  Conduction Disutrbances:undetermined  Narrative Interpretation: AFIB  Old EKG Reviewed: no significant change-- chronic AFIB  I have personally reviewed the EKG tracing and agree with printout.   Radiology Dg Chest Port 1 View  Result Date: 10/09/2017 CLINICAL DATA:  Shortness of breath.  Atrial fibrillation. EXAM: PORTABLE CHEST 1 VIEW COMPARISON:  Most recent radiograph 06/29/2017. Most recent CT 01/11/2017 FINDINGS: Marked cardiomegaly, unchanged. Moderate bilateral pleural effusions,  similar to prior exam. There is central pulmonary edema, with progression. No pneumothorax or new focal airspace disease. Multiple skin folds project over the left chest. IMPRESSION: Progressive pulmonary edema from prior exam. Chronic cardiomegaly and moderate bilateral pleural effusions. Electronically Signed   By: Jeb Levering M.D.   On: 10/09/2017 22:38    Procedures Procedures (including critical care time)  CRITICAL CARE Performed by: Larene Pickett   Total critical care time: 45 minutes  Critical care time was exclusive of separately billable procedures and treating other patients.  Critical care was necessary to treat or prevent imminent or life-threatening deterioration.  Critical care was time spent personally by me on the following activities: development of treatment plan with patient and/or surrogate as well as nursing, discussions with consultants, evaluation of patient's response to treatment, examination of patient, obtaining history from patient or surrogate, ordering and performing treatments and interventions, ordering and review of laboratory studies, ordering and review of radiographic studies, pulse oximetry and re-evaluation of patient's condition.   Medications Ordered in ED Medications  diltiazem (CARDIZEM) 1 mg/mL load via infusion 10 mg (10 mg Intravenous Bolus from Bag 10/09/17 2241)    And  diltiazem (CARDIZEM) 100 mg in dextrose 5% 163mL (1 mg/mL) infusion (5 mg/hr Intravenous New Bag/Given 10/09/17 2241)  furosemide (LASIX) injection 40 mg (40 mg Intravenous Given 10/09/17 2307)     Initial Impression / Assessment and Plan / ED Course  I have reviewed the triage vital signs and the nursing notes.  Pertinent labs & imaging results that were available during my care of the patient were reviewed by me and considered in my medical decision making (see chart for details).  84 year old female presenting to the  ED with shortness of breath.  On my initial  assessment she is in A. fib with RVR, rate 115 to 140.  Breathing does appear somewhat labored and she has some rails.  Likely component of CHF contributing.  Labs and chest x-ray pending.  This is been ongoing for 2 days now, she is no longer anticoagulated and is high risk for complications with electrocardioversion.  chadsvasc score of 9.  Based on chart review, it seems her AFIB is fairly constant.  Will give cardizem bolus and start on drip to try and slow her rate.    CXR with worsening pulmonary edema.  Extra 40mg  lasix IV ordered.  Labs still pending.  11:17 PM After IV bolus and continuous infusion of cardizem, patient's HR stabilized in the 80's, BP now 168/82.  She appears more comfortable.  Labs as above-- BNP 892.5, trop 0.04 which is likely secondary to demand.  She has no chest pain here.  Will admit to hospitalist for ongoing care.  Discussed with Dr. Blaine Hamper-- he will admit for ongoing care.  Final Clinical Impressions(s) / ED Diagnoses   Final diagnoses:  Atrial fibrillation, unspecified type [facility])  Acute pulmonary edema [facility])    ED Discharge Orders    None       Larene Pickett, PA-C 10/10/17 0013    Daleen Bo, MD 10/10/17 [phone]

## 2017-10-09 NOTE — ED Triage Notes (Signed)
Pt arrived via [facility], per EMS pt from home with c/o SOB; Pt A/Ox1 with Hx of Dementia, so this is her baseline; EMS states that approx 4p, family stated that the patient was having breathing issues; upon EMS arrival, pt diaphoretic and cool to touch; placed on O2 2L and sating between 94-100; 190/100; 130 P, CBG 158; Hx of Afib

## 2017-10-10 ENCOUNTER — Inpatient Hospital Stay (HOSPITAL_COMMUNITY): Payer: Medicare HMO

## 2017-10-10 DIAGNOSIS — E7800 Pure hypercholesterolemia, unspecified: Secondary | ICD-10-CM | POA: Diagnosis present

## 2017-10-10 DIAGNOSIS — R778 Other specified abnormalities of plasma proteins: Secondary | ICD-10-CM | POA: Diagnosis present

## 2017-10-10 DIAGNOSIS — J9610 Chronic respiratory failure, unspecified whether with hypoxia or hypercapnia: Secondary | ICD-10-CM | POA: Diagnosis not present

## 2017-10-10 DIAGNOSIS — I482 Chronic atrial fibrillation: Secondary | ICD-10-CM | POA: Diagnosis not present

## 2017-10-10 DIAGNOSIS — N183 Chronic kidney disease, stage 3 (moderate): Secondary | ICD-10-CM | POA: Diagnosis not present

## 2017-10-10 DIAGNOSIS — E875 Hyperkalemia: Secondary | ICD-10-CM

## 2017-10-10 DIAGNOSIS — J810 Acute pulmonary edema: Secondary | ICD-10-CM | POA: Diagnosis not present

## 2017-10-10 DIAGNOSIS — E1142 Type 2 diabetes mellitus with diabetic polyneuropathy: Secondary | ICD-10-CM

## 2017-10-10 DIAGNOSIS — I5033 Acute on chronic diastolic (congestive) heart failure: Secondary | ICD-10-CM

## 2017-10-10 DIAGNOSIS — I2510 Atherosclerotic heart disease of native coronary artery without angina pectoris: Secondary | ICD-10-CM | POA: Diagnosis not present

## 2017-10-10 DIAGNOSIS — I10 Essential (primary) hypertension: Secondary | ICD-10-CM | POA: Diagnosis not present

## 2017-10-10 DIAGNOSIS — I130 Hypertensive heart and chronic kidney disease with heart failure and stage 1 through stage 4 chronic kidney disease, or unspecified chronic kidney disease: Secondary | ICD-10-CM | POA: Diagnosis not present

## 2017-10-10 DIAGNOSIS — I5043 Acute on chronic combined systolic (congestive) and diastolic (congestive) heart failure: Secondary | ICD-10-CM | POA: Diagnosis not present

## 2017-10-10 DIAGNOSIS — E039 Hypothyroidism, unspecified: Secondary | ICD-10-CM | POA: Diagnosis not present

## 2017-10-10 DIAGNOSIS — R748 Abnormal levels of other serum enzymes: Secondary | ICD-10-CM

## 2017-10-10 DIAGNOSIS — R7989 Other specified abnormal findings of blood chemistry: Secondary | ICD-10-CM

## 2017-10-10 DIAGNOSIS — R0602 Shortness of breath: Secondary | ICD-10-CM | POA: Diagnosis not present

## 2017-10-10 DIAGNOSIS — E1122 Type 2 diabetes mellitus with diabetic chronic kidney disease: Secondary | ICD-10-CM | POA: Diagnosis present

## 2017-10-10 DIAGNOSIS — I4891 Unspecified atrial fibrillation: Secondary | ICD-10-CM

## 2017-10-10 DIAGNOSIS — I4892 Unspecified atrial flutter: Secondary | ICD-10-CM | POA: Diagnosis not present

## 2017-10-10 DIAGNOSIS — I272 Pulmonary hypertension, unspecified: Secondary | ICD-10-CM | POA: Diagnosis present

## 2017-10-10 DIAGNOSIS — L89151 Pressure ulcer of sacral region, stage 1: Secondary | ICD-10-CM | POA: Diagnosis not present

## 2017-10-10 DIAGNOSIS — Z8673 Personal history of transient ischemic attack (TIA), and cerebral infarction without residual deficits: Secondary | ICD-10-CM | POA: Diagnosis not present

## 2017-10-10 DIAGNOSIS — I701 Atherosclerosis of renal artery: Secondary | ICD-10-CM | POA: Diagnosis present

## 2017-10-10 DIAGNOSIS — I5040 Unspecified combined systolic (congestive) and diastolic (congestive) heart failure: Secondary | ICD-10-CM | POA: Diagnosis not present

## 2017-10-10 DIAGNOSIS — Z66 Do not resuscitate: Secondary | ICD-10-CM | POA: Diagnosis not present

## 2017-10-10 DIAGNOSIS — M19072 Primary osteoarthritis, left ankle and foot: Secondary | ICD-10-CM | POA: Diagnosis not present

## 2017-10-10 DIAGNOSIS — E785 Hyperlipidemia, unspecified: Secondary | ICD-10-CM | POA: Diagnosis present

## 2017-10-10 DIAGNOSIS — F0390 Unspecified dementia without behavioral disturbance: Secondary | ICD-10-CM | POA: Diagnosis not present

## 2017-10-10 DIAGNOSIS — I340 Nonrheumatic mitral (valve) insufficiency: Secondary | ICD-10-CM

## 2017-10-10 DIAGNOSIS — M109 Gout, unspecified: Secondary | ICD-10-CM | POA: Diagnosis not present

## 2017-10-10 DIAGNOSIS — I480 Paroxysmal atrial fibrillation: Secondary | ICD-10-CM | POA: Diagnosis not present

## 2017-10-10 DIAGNOSIS — D509 Iron deficiency anemia, unspecified: Secondary | ICD-10-CM | POA: Diagnosis not present

## 2017-10-10 DIAGNOSIS — J80 Acute respiratory distress syndrome: Secondary | ICD-10-CM | POA: Diagnosis not present

## 2017-10-10 DIAGNOSIS — R69 Illness, unspecified: Secondary | ICD-10-CM | POA: Diagnosis not present

## 2017-10-10 DIAGNOSIS — T502X5A Adverse effect of carbonic-anhydrase inhibitors, benzothiadiazides and other diuretics, initial encounter: Secondary | ICD-10-CM | POA: Diagnosis not present

## 2017-10-10 DIAGNOSIS — E876 Hypokalemia: Secondary | ICD-10-CM | POA: Diagnosis not present

## 2017-10-10 DIAGNOSIS — M79675 Pain in left toe(s): Secondary | ICD-10-CM | POA: Diagnosis not present

## 2017-10-10 DIAGNOSIS — I481 Persistent atrial fibrillation: Secondary | ICD-10-CM | POA: Diagnosis not present

## 2017-10-10 DIAGNOSIS — F329 Major depressive disorder, single episode, unspecified: Secondary | ICD-10-CM | POA: Diagnosis present

## 2017-10-10 LAB — BASIC METABOLIC PANEL
Anion gap: 12 (ref 5–15)
Anion gap: 13 (ref 5–15)
BUN: 15 mg/dL (ref 6–20)
BUN: 14 mg/dL (ref 6–20)
CHLORIDE: 96 mmol/L — AB (ref 101–111)
CHLORIDE: 95 mmol/L — AB (ref 101–111)
CO2: 29 mmol/L (ref 22–32)
CO2: 32 mmol/L (ref 22–32)
CREATININE: 1.36 mg/dL — AB (ref 0.44–1.00)
Calcium: 7.6 mg/dL — ABNORMAL LOW (ref 8.9–10.3)
Calcium: 7.8 mg/dL — ABNORMAL LOW (ref 8.9–10.3)
Creatinine, Ser: 1.29 mg/dL — ABNORMAL HIGH (ref 0.44–1.00)
GFR calc Af Amer: 43 mL/min — ABNORMAL LOW (ref >60)
GFR calc Af Amer: 40 mL/min — ABNORMAL LOW (ref >60)
GFR calc non Af Amer: 37 mL/min — ABNORMAL LOW (ref >60)
GFR calc non Af Amer: 35 mL/min — ABNORMAL LOW (ref >60)
GLUCOSE: 114 mg/dL — AB (ref 65–99)
GLUCOSE: 160 mg/dL — AB (ref 65–99)
POTASSIUM: 7.2 mmol/L — AB (ref 3.5–5.1)
POTASSIUM: 2.7 mmol/L — AB (ref 3.5–5.1)
SODIUM: 137 mmol/L (ref 135–145)
SODIUM: 140 mmol/L (ref 135–145)

## 2017-10-10 LAB — LIPID PANEL
CHOLESTEROL: 173 mg/dL (ref 0–200)
HDL: 37 mg/dL — ABNORMAL LOW (ref >40)
LDL CALC: 119 mg/dL — AB (ref 0–99)
Total CHOL/HDL Ratio: 4.7 RATIO
Triglycerides: 87 mg/dL (ref <150)
VLDL: 17 mg/dL (ref 0–40)

## 2017-10-10 LAB — ECHOCARDIOGRAM COMPLETE

## 2017-10-10 LAB — HEMOGLOBIN A1C
Hgb A1c MFr Bld: 5.5 % (ref 4.8–5.6)
Mean Plasma Glucose: 111.15 mg/dL

## 2017-10-10 LAB — TROPONIN I
TROPONIN I: 0.03 ng/mL — AB (ref <0.03)
Troponin I: 0.04 ng/mL (ref <0.03)

## 2017-10-10 LAB — POTASSIUM: Potassium: 3.2 mmol/L — ABNORMAL LOW (ref 3.5–5.1)

## 2017-10-10 MED ORDER — POTASSIUM CHLORIDE CRYS ER 20 MEQ PO TBCR
40.0000 meq | EXTENDED_RELEASE_TABLET | Freq: Once | ORAL | Status: AC
  Administered 2017-10-10: 40 meq via ORAL
  Filled 2017-10-10: qty 2

## 2017-10-10 MED ORDER — ASPIRIN EC 81 MG PO TBEC
81.0000 mg | DELAYED_RELEASE_TABLET | Freq: Every day | ORAL | Status: DC
  Administered 2017-10-10 – 2017-10-16 (×7): 81 mg via ORAL
  Filled 2017-10-10 (×7): qty 1

## 2017-10-10 MED ORDER — LEVOTHYROXINE SODIUM 25 MCG PO TABS
25.0000 ug | ORAL_TABLET | Freq: Every day | ORAL | Status: DC
  Administered 2017-10-11 – 2017-10-16 (×6): 25 ug via ORAL
  Filled 2017-10-10 (×6): qty 1

## 2017-10-10 MED ORDER — SODIUM CHLORIDE 0.9% FLUSH: 3.0000 mL | INTRAVENOUS | Status: DC | PRN

## 2017-10-10 MED ORDER — ENOXAPARIN SODIUM 40 MG/0.4ML ~~LOC~~ SOLN
40.0000 mg | Freq: Every day | SUBCUTANEOUS | Status: DC
  Administered 2017-10-10 – 2017-10-16 (×7): 40 mg via SUBCUTANEOUS
  Filled 2017-10-10 (×7): qty 0.4

## 2017-10-10 MED ORDER — DILTIAZEM HCL ER COATED BEADS 120 MG PO CP24
120.0000 mg | ORAL_CAPSULE | Freq: Every day | ORAL | Status: DC
  Administered 2017-10-10 – 2017-10-16 (×7): 120 mg via ORAL
  Filled 2017-10-10 (×7): qty 1

## 2017-10-10 MED ORDER — SODIUM CHLORIDE 0.9 % IV SOLN: 250.0000 mL | INTRAVENOUS | Status: DC | PRN

## 2017-10-10 MED ORDER — DONEPEZIL HCL 10 MG PO TABS
10.0000 mg | ORAL_TABLET | Freq: Every day | ORAL | Status: DC
  Administered 2017-10-10 – 2017-10-15 (×7): 10 mg via ORAL
  Filled 2017-10-10 (×8): qty 1

## 2017-10-10 MED ORDER — FUROSEMIDE 10 MG/ML IJ SOLN
40.0000 mg | Freq: Two times a day (BID) | INTRAMUSCULAR | Status: DC
  Administered 2017-10-10 – 2017-10-15 (×11): 40 mg via INTRAVENOUS
  Filled 2017-10-10 (×11): qty 4

## 2017-10-10 MED ORDER — HYDROXYZINE HCL 10 MG PO TABS
10.0000 mg | ORAL_TABLET | Freq: Three times a day (TID) | ORAL | Status: DC | PRN
  Administered 2017-10-10: 10 mg via ORAL
  Filled 2017-10-10: qty 1

## 2017-10-10 MED ORDER — HYDRALAZINE HCL 20 MG/ML IJ SOLN
5.0000 mg | INTRAMUSCULAR | Status: DC | PRN
  Administered 2017-10-10: 5 mg via INTRAVENOUS
  Filled 2017-10-10: qty 1

## 2017-10-10 MED ORDER — ACETAMINOPHEN 325 MG PO TABS: 650.0000 mg | ORAL_TABLET | ORAL | Status: DC | PRN

## 2017-10-10 MED ORDER — HYDRALAZINE HCL 50 MG PO TABS
75.0000 mg | ORAL_TABLET | Freq: Three times a day (TID) | ORAL | Status: DC
  Administered 2017-10-10 – 2017-10-16 (×21): 75 mg via ORAL
  Filled 2017-10-10 (×4): qty 1
  Filled 2017-10-10: qty 3
  Filled 2017-10-10 (×5): qty 1
  Filled 2017-10-10: qty 3
  Filled 2017-10-10 (×9): qty 1

## 2017-10-10 MED ORDER — SERTRALINE HCL 50 MG PO TABS
50.0000 mg | ORAL_TABLET | Freq: Every day | ORAL | Status: DC
  Administered 2017-10-10 – 2017-10-16 (×7): 50 mg via ORAL
  Filled 2017-10-10 (×7): qty 1

## 2017-10-10 MED ORDER — FERROUS SULFATE 325 (65 FE) MG PO TABS
325.0000 mg | ORAL_TABLET | Freq: Two times a day (BID) | ORAL | Status: DC
  Administered 2017-10-10 – 2017-10-16 (×12): 325 mg via ORAL
  Filled 2017-10-10 (×12): qty 1

## 2017-10-10 MED ORDER — IPRATROPIUM-ALBUTEROL 0.5-2.5 (3) MG/3ML IN SOLN: 3.0000 mL | Freq: Four times a day (QID) | RESPIRATORY_TRACT | Status: DC | PRN

## 2017-10-10 MED ORDER — ALLOPURINOL 100 MG PO TABS
100.0000 mg | ORAL_TABLET | Freq: Every day | ORAL | Status: DC
  Administered 2017-10-10 – 2017-10-16 (×7): 100 mg via ORAL
  Filled 2017-10-10 (×7): qty 1

## 2017-10-10 MED ORDER — ZOLPIDEM TARTRATE 5 MG PO TABS: 5.0000 mg | ORAL_TABLET | Freq: Every evening | ORAL | Status: DC | PRN

## 2017-10-10 MED ORDER — LEVALBUTEROL HCL 1.25 MG/0.5ML IN NEBU
1.2500 mg | INHALATION_SOLUTION | Freq: Four times a day (QID) | RESPIRATORY_TRACT | Status: DC
  Administered 2017-10-10 – 2017-10-11 (×2): 1.25 mg via RESPIRATORY_TRACT
  Filled 2017-10-10 (×5): qty 0.5

## 2017-10-10 MED ORDER — SODIUM CHLORIDE 0.9% FLUSH
3.0000 mL | Freq: Two times a day (BID) | INTRAVENOUS | Status: DC
  Administered 2017-10-10 – 2017-10-16 (×11): 3 mL via INTRAVENOUS

## 2017-10-10 MED ORDER — METOPROLOL SUCCINATE ER 25 MG PO TB24
25.0000 mg | ORAL_TABLET | Freq: Every day | ORAL | Status: DC
  Administered 2017-10-10 – 2017-10-16 (×7): 25 mg via ORAL
  Filled 2017-10-10 (×7): qty 1

## 2017-10-10 MED ORDER — POTASSIUM CHLORIDE 10 MEQ/100ML IV SOLN
10.0000 meq | INTRAVENOUS | Status: AC
  Administered 2017-10-10 – 2017-10-11 (×4): 10 meq via INTRAVENOUS
  Filled 2017-10-10 (×4): qty 100

## 2017-10-10 MED ORDER — DM-GUAIFENESIN ER 30-600 MG PO TB12: 1.0000 | ORAL_TABLET | Freq: Two times a day (BID) | ORAL | Status: DC | PRN

## 2017-10-10 MED ORDER — MORPHINE SULFATE (PF) 4 MG/ML IV SOLN: 1.0000 mg | INTRAVENOUS | Status: DC | PRN

## 2017-10-10 MED ORDER — ISOSORBIDE MONONITRATE ER 30 MG PO TB24
30.0000 mg | ORAL_TABLET | Freq: Two times a day (BID) | ORAL | Status: DC
  Administered 2017-10-10 – 2017-10-16 (×14): 30 mg via ORAL
  Filled 2017-10-10 (×14): qty 1

## 2017-10-10 MED ORDER — NITROGLYCERIN 0.4 MG SL SUBL: 0.4000 mg | SUBLINGUAL_TABLET | SUBLINGUAL | Status: DC | PRN

## 2017-10-10 MED ORDER — IPRATROPIUM BROMIDE 0.02 % IN SOLN
0.5000 mg | Freq: Four times a day (QID) | RESPIRATORY_TRACT | Status: DC
  Administered 2017-10-10 – 2017-10-11 (×4): 0.5 mg via RESPIRATORY_TRACT
  Filled 2017-10-10 (×4): qty 2.5

## 2017-10-10 NOTE — ED Notes (Signed)
Echo at bedside

## 2017-10-10 NOTE — H&P (Signed)
History and Physical    Nicole [name] ZJQ:[id number] DOB: [birth year] DOA: 10/09/2017  Referring MD/NP/PA:   PCP: [name], DO   Patient coming from:  The patient is coming from home.  At baseline, pt is partially dependent for most of ADL.    Chief Complaint: SOB and leg edema  HPI: Nicole [name] is a 84 y.o. female with medical history significant of hypertension, hyperlipidemia, diet-controlled diabetes, stroke, GERD, hypothyroidism, gout, depression, anxiety, thoracic aneurysm, AAA, colon cancer, atrial fibrillation not on anticoagulants, PE, ICH, dementia, who presents with shortness breath and leg edema.  Patient states that she started having worsening shortness of breath this afternoon, which has been progressively getting worse. She has dry cough, no chest pain. Patient denies fever or chills. She does not have nausea, vomiting, diarrhea, abdominal pain, symptoms of UTI. She also reports worsening bilateral leg edema.   ED Course: pt was found to have BNP 892.5, trop 0.04, potassium of 5.5 which is likely due to hemolysis, creatinine 1.08, temperature normal, atrial fibrillation with RVR, oxygen saturation 98% on 2 L nasal cannula oxygen. This is due to pulmonary edema and bilateral moderate pleural effusion. The patient is admitted to stepdown as inpatient.  Review of Systems:   General: no fevers, chills, has poor appetite, has fatigue HEENT: no blurry vision, hearing changes or sore throat Respiratory: has dyspnea, coughing, no wheezing CV: no chest pain, no palpitations GI: no nausea, vomiting, abdominal pain, diarrhea, constipation GU: no dysuria, burning on urination, increased urinary frequency, hematuria  Ext: has leg edema Neuro: no unilateral weakness, numbness, or tingling, no vision change or hearing loss Skin: no rash, no skin tear. MSK: No muscle spasm, no deformity, no limitation of range of movement in spin Heme: No easy bruising.  Travel history:  No recent long distant travel.  Allergy:  Allergies  Allergen Reactions  . Iohexol Nausea And Vomiting  . Iodinated Diagnostic Agents Nausea And Vomiting  . Z-Pak [Azithromycin] Other (See Comments)    Reaction unknown by family    Past Medical History:  Diagnosis Date  . AAA (abdominal aortic aneurysm) ([place])   . Anemia   . Anxiety   . Arthritis   . Atrial fibrillation ([place])   . Cervicalgia   . Cholelithiasis   . Chronic systolic heart failure ([place])   . CKD (chronic kidney disease)   . Colon cancer ([place]) 07/1997  . Depression   . Diabetes mellitus ([place])   . Dizziness and giddiness   . Electrolyte and fluid disorders not elsewhere classified   . GERD (gastroesophageal reflux disease)   . Gout, unspecified   . Hemorrhage of rectum and anus   . Hypercholesteremia   . Hypertension   . Hypoglycemia, unspecified   . Hypopotassemia   . Hypothyroidism   . Kidney stone    renal calculi  . Other chronic allergic conjunctivitis   . Other specified cardiac dysrhythmias(427.89)   . Pain in joint, lower leg   . Perforation of bile duct   . Proteinuria   . Shortness of breath   . Small bowel obstruction due to adhesions ([place]) 05/16/2012  . Stroke ([place])   . Swelling, mass, or lump in head and neck   . Thoracic aneurysm without mention of rupture    4.6 cm Asc Aortic aneurysm, CT 07/2015    Past Surgical History:  Procedure Laterality Date  . CARDIAC CATHETERIZATION  2008   moderate severe pulm HTN; with elevted pulm capillary wedge  pressure   . CHOLECYSTECTOMY  05/14/2012   Procedure: LAPAROSCOPIC CHOLECYSTECTOMY WITH INTRAOPERATIVE CHOLANGIOGRAM;  Surgeon: Haywood Lasso, MD;  Location: [facility];  Service: General;  Laterality: N/A;  . ERCP  05/17/2012   Procedure: ENDOSCOPIC RETROGRADE CHOLANGIOPANCREATOGRAPHY (ERCP);  Surgeon: Missy Sabins, MD;  Location: [facility];  Service: Gastroenterology;  Laterality: N/A;  . ERCP  08/12/2012   Procedure: ENDOSCOPIC RETROGRADE  CHOLANGIOPANCREATOGRAPHY (ERCP);  Surgeon: Missy Sabins, MD;  Location[facility] ENDOSCOPY;  Service: Endoscopy;  Laterality: N/A;  . HEMICOLECTOMY  08/11/1997  . KIDNEY STONE SURGERY    . TRANSTHORACIC ECHOCARDIOGRAM  11/2009   EF 45-50%, mild-mod AV regurg; mild MR; mod TR; ascending aorta mildly dilated    Social History:  reports that  has never smoked. she has never used smokeless tobacco. She reports that she does not drink alcohol or use drugs.  Family History:  Family History  Problem Relation Age of Onset  . Heart disease Mother   . Stroke Father   . Hypertension Daughter   . Hypertension Son   . Diabetes Sister   . Hypertension Son      Prior to Admission medications   Medication Sig Start Date End Date Taking? Authorizing Provider  allopurinol (ZYLOPRIM) 100 MG tablet Take 1 tablet (100 mg total) by mouth daily. 08/21/17  Yes [name], [name], DO  diltiazem (CARDIZEM CD) 120 MG 24 hr capsule Take 1 capsule (120 mg total) by mouth daily. 08/19/17  Yes [name], [name], DO  donepezil (ARICEPT) 10 MG tablet Take one tablet by mouth once daily at bedtime Patient taking differently: Take 10 mg by mouth at bedtime. Take one tablet by mouth once daily at bedtime 08/08/17  Yes Lauree Chandler, NP  ferrous sulfate 325 (65 FE) MG EC tablet Take 1 tablet (325 mg total) by mouth 2 (two) times daily with a meal. 08/08/17  Yes Lauree Chandler, NP  furosemide (LASIX) 40 MG tablet Take 1 tablet (40 mg total) by mouth daily. 08/08/17  Yes Lauree Chandler, NP  hydrALAZINE (APRESOLINE) 25 MG tablet TAKE 3 TABLETS 3 TIMES A DAY Patient taking differently: TAKE 75 mg TABLETS 3 TIMES A DAY 09/25/17  Yes [name], [name], DO  ipratropium-albuterol (DUONEB) 0.5-2.5 (3) MG/3ML SOLN Take 3 mLs by nebulization every 6 (six) hours as needed (SOB/wheezing).   Yes [provider]  isosorbide mononitrate (IMDUR) 30 MG 24 hr tablet Take 1 tablet (30 mg total) by mouth 2 (two) times daily. 08/08/17  Yes  Lauree Chandler, NP  levothyroxine (SYNTHROID, LEVOTHROID) 25 MCG tablet Take 25 mcg by mouth daily before breakfast.   Yes [provider]  metoprolol succinate (TOPROL-XL) 25 MG 24 hr tablet Take 1 tablet (25 mg total) by mouth daily. 09/27/17  Yes [name], [name], DO  NUTRITIONAL SUPPLEMENT LIQD Take 120 mLs by mouth as needed.    Yes [provider]  OXYGEN Inhale 2 L into the lungs as needed.   Yes [provider]  potassium chloride (K-DUR) 10 MEQ tablet Take 2 tablets (20 mEq total) by mouth daily. 07/29/17  Yes [name], [name], DO  sertraline (ZOLOFT) 50 MG tablet Take 1 tablet (50 mg total) by mouth daily. 07/29/17  Yes [name], [name], DO  labetalol (NORMODYNE) 300 MG tablet Take 1 tablet (300 mg total) by mouth 2 (two) times daily. Patient not taking: Reported on 10/09/2017 08/08/17 09/07/17  Lauree Chandler, NP  nystatin cream (MYCOSTATIN) Apply 1  application topically 2 (two) times daily. To gluteal crease Patient not taking: Reported on 10/09/2017 08/09/17   [name], DO    Physical Exam: Vitals:   10/09/17 2145 10/09/17 2300 10/09/17 2315 10/10/17 0030  BP: (!) 166/110 (!) 162/98 (!) 172/97 (!) 183/155  Pulse: 82 76 78 74  Resp: 20 17 17  (!) 23  Temp:      TempSrc:      SpO2: 98% 98% 97% 93%   General: Not in acute distress HEENT:       Eyes: PERRL, EOMI, no scleral icterus.       ENT: No discharge from the ears and nose, no pharynx injection, no tonsillar enlargement.        Neck: positive JVD, no bruit, no mass felt. Heme: No neck lymph node enlargement. Cardiac: S1/S2, RRR, No murmurs, No gallops or rubs. Respiratory: has fine crackles bilaterally.  GI: Soft, nondistended, nontender, no rebound pain, no organomegaly, BS present. GU: No hematuria Ext: 2+ pitting leg edema bilaterally. 2+DP/PT pulse bilaterally. Musculoskeletal: No joint deformities, No joint redness or warmth, no limitation of ROM in spin. Skin: No rashes.  Neuro:  Alert, oriented to person and place, but not to time, cranial nerves II-XII grossly intact, moves all extremities. Psych: Patient is not psychotic, no suicidal or hemocidal ideation.  Labs on Admission: I have personally reviewed following labs and imaging studies  CBC: Recent Labs  Lab 10/09/17 2020  WBC 5.4  NEUTROABS 4.4  HGB 14.6  HCT 42.1  MCV 79.1  PLT 809   Basic Metabolic Panel: Recent Labs  Lab 10/09/17 2020  NA 137  K 5.5*  CL 96*  CO2 27  GLUCOSE 102*  BUN 11  CREATININE 1.08*  CALCIUM 7.7*   GFR: CrCl cannot be calculated (Unknown ideal weight.). Liver Function Tests: No results for input(s): AST, ALT, ALKPHOS, BILITOT, PROT, ALBUMIN in the last 168 hours. No results for input(s): LIPASE, AMYLASE in the last 168 hours. No results for input(s): AMMONIA in the last 168 hours. Coagulation Profile: No results for input(s): INR, PROTIME in the last 168 hours. Cardiac Enzymes: Recent Labs  Lab 10/09/17 2308  TROPONINI 0.04*   BNP (last 3 results) No results for input(s): PROBNP in the last 8760 hours. HbA1C: No results for input(s): HGBA1C in the last 72 hours. CBG: No results for input(s): GLUCAP in the last 168 hours. Lipid Profile: No results for input(s): CHOL, HDL, LDLCALC, TRIG, CHOLHDL, LDLDIRECT in the last 72 hours. Thyroid Function Tests: No results for input(s): TSH, T4TOTAL, FREET4, T3FREE, THYROIDAB in the last 72 hours. Anemia Panel: No results for input(s): VITAMINB12, FOLATE, FERRITIN, TIBC, IRON, RETICCTPCT in the last 72 hours. Urine analysis:    Component Value Date/Time   COLORURINE AMBER (A) 05/23/2017 0156   APPEARANCEUR HAZY (A) 05/23/2017 0156   LABSPEC 1.026 05/23/2017 0156   PHURINE 5.0 05/23/2017 0156   GLUCOSEU NEGATIVE 05/23/2017 0156   HGBUR NEGATIVE 05/23/2017 0156   BILIRUBINUR SMALL (A) 05/23/2017 0156   KETONESUR NEGATIVE 05/23/2017 0156   PROTEINUR >=300 (A) 05/23/2017 0156   UROBILINOGEN 2.0 (H) 04/13/2015  1844   NITRITE NEGATIVE 05/23/2017 0156   LEUKOCYTESUR NEGATIVE 05/23/2017 0156   Sepsis Labs: @LABRCNTIP (procalcitonin:4,lacticidven:4) )No results found for this or any previous visit (from the past 240 hour(s)).   Radiological Exams on Admission: Dg Chest Port 1 View  Result Date: 10/09/2017 CLINICAL DATA:  Shortness of breath.  Atrial fibrillation. EXAM: PORTABLE CHEST 1 VIEW COMPARISON:  Most  recent radiograph 06/29/2017. Most recent CT 01/11/2017 FINDINGS: Marked cardiomegaly, unchanged. Moderate bilateral pleural effusions, similar to prior exam. There is central pulmonary edema, with progression. No pneumothorax or new focal airspace disease. Multiple skin folds project over the left chest. IMPRESSION: Progressive pulmonary edema from prior exam. Chronic cardiomegaly and moderate bilateral pleural effusions. Electronically Signed   By: Jeb Levering M.D.   On: 10/09/2017 22:38     EKG: Independently reviewed.  A. Fib with RVR, QTC 542, T-wave inversion in V5 to V6.   Assessment/Plan Principal Problem:   Acute on chronic diastolic CHF (congestive heart failure) (HCC) Active Problems:   Atrial fibrillation with RVR (HCC)   Permanent atrial fibrillation (HCC)   Major depression   Hypothyroidism   Hypertension   Gout, unspecified   Hyperkalemia   Dementia   CAD (coronary artery disease)   Elevated troponin   Acute on chronic diastolic CHF (congestive heart failure) ([place]): patient's shortness breath is likely due to CHF exacerbation. Patient has bilateral leg edema, positive JVD, pulm edema on chest x-ray and elevated BNP, consistent with CHF exacerbation. Patient does not have chest pain, have low suspicion for PE.  -will admit to SDU as inpt. -Lasix 40 mg bid by IV -trop x 3 -2d echo -start ASA -Daily weights -strict I/O's -Low salt diet  Hx of CAD and elevated trop: no chest pain. Likely due to demand ischemia.  - cycle CE q6 x3 and repeat EKG in the am  - prn  Nitroglycerin, Morphine, and  - start aspirin - Risk factor stratification: will check FLP and A1C  - 2d echo  Atrial fibrillation with RVR ([place]): likely triggered by CHF exacerbation. HR is up to 130s. CHA2DS2-VASc Score is 9, needs oral anticoagulation, but pt is not on AC, due to hx of GIB, ICH and dementia. -cardizem gtt. -continue metoprolol and oral Cardizem  Major depression: -continue home med  Hypothyroidism: Last TSH was 4.29 -Continue home Synthroid  Gout: -continue home allopurinol   Dementia: no EtOH) -Continue donepezil  Hyperkalemia: K=5.5, possibly due to hemolysis. -Repeat potassium level -Follow-up BMP    DVT ppx:   SQ Lovenox Code Status: DNR (there is DNR ordered in Epic which was put in by NP Wadie Lessen on 01/14/17) Family Communication: None at bed side. Disposition Plan:  Anticipate discharge back to previous home environment Consults called:  none Admission status: SDU/inpation       Date of Service 10/10/2017    Ivor Costa [facility] Pager [phone]  If 7PM-7AM, please contact night-coverage [email or web address] Password [facility] 10/10/2017, 12:46 AM

## 2017-10-10 NOTE — Consult Note (Signed)
Cardiology Consultation:   Patient ID: Nicole [name]; [id number]; 12-18-1930   Admit date: 10/09/2017 Date of Consult: 10/10/2017  Primary Care Provider: [name], DO Primary Cardiologist: Dr. Debara Pickett  Patient Profile:   Nicole [name] is a 84 y.o. female with a hx of chronic atrial fibrillation with controlled ventricular response not on anticoagulation secondary to GI/epistaxis bleed, chronic diastolic  heart failure LVEF 55-60% (recovered from 30-35% in 2008), HTN, HLD, diet-controlled DM, CVA, hypothyroidism, dementia, ascending thoracic aortic aneurysm (4.8 cm ascending thoracic aortic aneurysm) and GERD who is being seen today for the evaluation of atrial fibrillation with RVR and elevated troponin at the request of Dr. Blaine Hamper.  History of Present Illness:   Nicole [name] is an 84 year old female, patient of Dr. Debara Pickett with history as stated above who was last seen in the office on 07/22/2017. At that point, she was having some difficulty with depression secondary to recent death of both of her sons. She had recently been admitted to the hospital for congestive heart failure and required a subsequent nursing home stay thereafter.  She was seen by her PCP provider with a long discussion about palliative and end-of-life care issues.  At her cardiology appointment, she continued to have complaints of worsening lower extremity swelling.  Per chart review at that time, there were multiple diuretic inconsistencies throughout the chart. Overall, she was up 7 pounds in weight from her last office visit in early December 2018. Subsequently, her Lasix was increased to 40 mg daily per Dr. Debara Pickett (likely was decreased by PCP secondary to worsening renal function but will need to accept slightly higher creatinine in order to keep her euvolemic).   On 10/09/2017 the pt presented to [facility] with complaints of increasing exertional shortness of breath and lower extremity edema over the course of one week,  however she reports that the lower extremity swelling has never really gone down completely since her hospital admission at the end of last year. Additionally, she is reporting intermittent, anterior, non-radiating chest pain, rated a 2/10 in severity for the last several weeks. She states that the pain comes and goes, but does worsen with activity and lessens when she sits. She denies associated N/V, diaphoresis, dizziness, syncope or falls.  She reports that she has been fully compliant with her medications, however her history is a bit hard to follow. She is now living at home with a companion and has a caretaker who helps with medications. She reports eating whatever she wants including canned foods and lots of fluids.   In the emergency room the patient was found to have a BNP level of 892, troponins were mildly elevated at 0.04, 0.03, and 0.04. She was hyperkalemic at 5.5, however this was thought to be hemolyzed. Her creatinine was mildly elevated at 1.08, but it appears that her baseline is in the 1.1-1.7 range. She was afebrile and O2 saturations were 98% on 2 L nasal cannula.  An EKG was performed which showed atypical afib/aflutter with a rate of 92 bpm. HR on tele review are in the 80's-120's. Her CXR revealed progressive pulmonary edema, chronic cardiomegaly and moderate bilateral pleural effusions.  Past Medical History:  Diagnosis Date  . AAA (abdominal aortic aneurysm) ([place])   . Anemia   . Anxiety   . Arthritis   . Atrial fibrillation ([place])   . Cervicalgia   . Cholelithiasis   . Chronic systolic heart failure ([place])   . CKD (chronic kidney disease)   .  Colon cancer ([place]) 07/1997  . Depression   . Diabetes mellitus ([place])   . Dizziness and giddiness   . Electrolyte and fluid disorders not elsewhere classified   . GERD (gastroesophageal reflux disease)   . Gout, unspecified   . Hemorrhage of rectum and anus   . Hypercholesteremia   . Hypertension   . Hypoglycemia, unspecified     . Hypopotassemia   . Hypothyroidism   . Kidney stone    renal calculi  . Other chronic allergic conjunctivitis   . Other specified cardiac dysrhythmias(427.89)   . Pain in joint, lower leg   . Perforation of bile duct   . Proteinuria   . Shortness of breath   . Small bowel obstruction due to adhesions ([place]) 05/16/2012  . Stroke ([place])   . Swelling, mass, or lump in head and neck   . Thoracic aneurysm without mention of rupture    4.6 cm Asc Aortic aneurysm, CT 07/2015    Past Surgical History:  Procedure Laterality Date  . CARDIAC CATHETERIZATION  2008   moderate severe pulm HTN; with elevted pulm capillary wedge pressure   . CHOLECYSTECTOMY  05/14/2012   Procedure: LAPAROSCOPIC CHOLECYSTECTOMY WITH INTRAOPERATIVE CHOLANGIOGRAM;  Surgeon: Haywood Lasso, MD;  Location: [facility];  Service: General;  Laterality: N/A;  . ERCP  05/17/2012   Procedure: ENDOSCOPIC RETROGRADE CHOLANGIOPANCREATOGRAPHY (ERCP);  Surgeon: Missy Sabins, MD;  Location: [facility];  Service: Gastroenterology;  Laterality: N/A;  . ERCP  08/12/2012   Procedure: ENDOSCOPIC RETROGRADE CHOLANGIOPANCREATOGRAPHY (ERCP);  Surgeon: Missy Sabins, MD;  Location: [facility] ENDOSCOPY;  Service: Endoscopy;  Laterality: N/A;  . HEMICOLECTOMY  08/11/1997  . KIDNEY STONE SURGERY    . TRANSTHORACIC ECHOCARDIOGRAM  11/2009   EF 45-50%, mild-mod AV regurg; mild MR; mod TR; ascending aorta mildly dilated     Prior to Admission medications   Medication Sig Start Date End Date Taking? Authorizing Provider  allopurinol (ZYLOPRIM) 100 MG tablet Take 1 tablet (100 mg total) by mouth daily. 08/21/17  Yes [name], [name], DO  diltiazem (CARDIZEM CD) 120 MG 24 hr capsule Take 1 capsule (120 mg total) by mouth daily. 08/19/17  Yes [name], [name], DO  donepezil (ARICEPT) 10 MG tablet Take one tablet by mouth once daily at bedtime Patient taking differently: Take 10 mg by mouth at bedtime. Take one tablet by mouth once daily at bedtime 08/08/17  Yes  Lauree Chandler, NP  ferrous sulfate 325 (65 FE) MG EC tablet Take 1 tablet (325 mg total) by mouth 2 (two) times daily with a meal. 08/08/17  Yes Lauree Chandler, NP  furosemide (LASIX) 40 MG tablet Take 1 tablet (40 mg total) by mouth daily. 08/08/17  Yes Lauree Chandler, NP  hydrALAZINE (APRESOLINE) 25 MG tablet TAKE 3 TABLETS 3 TIMES A DAY Patient taking differently: TAKE 75 mg TABLETS 3 TIMES A DAY 09/25/17  Yes [name], [name], DO  ipratropium-albuterol (DUONEB) 0.5-2.5 (3) MG/3ML SOLN Take 3 mLs by nebulization every 6 (six) hours as needed (SOB/wheezing).   Yes [provider]  isosorbide mononitrate (IMDUR) 30 MG 24 hr tablet Take 1 tablet (30 mg total) by mouth 2 (two) times daily. 08/08/17  Yes Lauree Chandler, NP  levothyroxine (SYNTHROID, LEVOTHROID) 25 MCG tablet Take 25 mcg by mouth daily before breakfast.   Yes [provider]  metoprolol succinate (TOPROL-XL) 25 MG 24 hr tablet Take 1 tablet (25 mg total) by mouth daily. 09/27/17  Yes [name],  [name], DO  NUTRITIONAL SUPPLEMENT LIQD Take 120 mLs by mouth as needed.    Yes [provider]  OXYGEN Inhale 2 L into the lungs as needed.   Yes [provider]  potassium chloride (K-DUR) 10 MEQ tablet Take 2 tablets (20 mEq total) by mouth daily. 07/29/17  Yes [name], [name], DO  sertraline (ZOLOFT) 50 MG tablet Take 1 tablet (50 mg total) by mouth daily. 07/29/17  Yes [name], [name], DO  labetalol (NORMODYNE) 300 MG tablet Take 1 tablet (300 mg total) by mouth 2 (two) times daily. Patient not taking: Reported on 10/09/2017 08/08/17 09/07/17  Lauree Chandler, NP  nystatin cream (MYCOSTATIN) Apply 1 application topically 2 (two) times daily. To gluteal crease Patient not taking: Reported on 10/09/2017 08/09/17   [name], DO    Inpatient Medications: Scheduled Meds: . allopurinol  100 mg Oral Daily  . aspirin EC  81 mg Oral Daily  . diltiazem  120 mg Oral Daily  . donepezil  10 mg Oral QHS    . enoxaparin (LOVENOX) injection  40 mg Subcutaneous Daily  . ferrous sulfate  325 mg Oral BID WC  . furosemide  40 mg Intravenous BID  . hydrALAZINE  75 mg Oral Q8H  . ipratropium  0.5 mg Nebulization Q6H  . isosorbide mononitrate  30 mg Oral BID  . levalbuterol  1.25 mg Nebulization Q6H  . levothyroxine  25 mcg Oral QAC breakfast  . metoprolol succinate  25 mg Oral Daily  . sertraline  50 mg Oral Daily  . sodium chloride flush  3 mL Intravenous Q12H   Continuous Infusions: . sodium chloride    . diltiazem (CARDIZEM) infusion Stopped (10/10/17 1158)   PRN Meds: sodium chloride, acetaminophen, dextromethorphan-guaiFENesin, hydrALAZINE, hydrOXYzine, morphine injection, nitroGLYCERIN, sodium chloride flush, zolpidem  Allergies:    Allergies  Allergen Reactions  . Iohexol Nausea And Vomiting  . Iodinated Diagnostic Agents Nausea And Vomiting  . Z-Pak [Azithromycin] Other (See Comments)    Reaction unknown by family    Social History:   Social History   Socioeconomic History  . Marital status: Widowed    Spouse name: Not on file  . Number of children: Not on file  . Years of education: Not on file  . Highest education level: Not on file  Occupational History  . Not on file  Social Needs  . Financial resource strain: Not hard at all  . Food insecurity:    Worry: Never true    Inability: Never true  . Transportation needs:    Medical: No    Non-medical: No  Tobacco Use  . Smoking status: Never Smoker  . Smokeless tobacco: Never Used  Substance and Sexual Activity  . Alcohol use: No  . Drug use: No  . Sexual activity: Never  Lifestyle  . Physical activity:    Days per week: 0 days    Minutes per session: 0 min  . Stress: Not at all  Relationships  . Social connections:    Talks on phone: More than three times a week    Gets together: More than three times a week    Attends religious service: 1 to 4 times per year    Active member of club or organization: Yes     Attends meetings of clubs or organizations: Never    Relationship status: Widowed  . Intimate partner violence:    Fear of current or ex partner: No    Emotionally  abused: No    Physically abused: No    Forced sexual activity: No  Other Topics Concern  . Not on file  Social History Narrative  . Not on file    Family History:   Family History  Problem Relation Age of Onset  . Heart disease Mother   . Stroke Father   . Hypertension Daughter   . Hypertension Son   . Diabetes Sister   . Hypertension Son    Family Status:  Family Status  Relation Name Status  . Mother  Deceased  . Father  Deceased  . Sister [employer]  . Brother Francee Piccolo Deceased       liver disease  . Daughter Darla Lesches  . Son Juanda Crumble Deceased  . Brother [employer]  . Sister [employer]  . Son Jiles Prows Alive       Emphysema  . Son Arts administrator Alive    ROS:  Please see the history of present illness.  All other ROS reviewed and negative.     Physical Exam/Data:   Vitals:   10/10/17 0945 10/10/17 1045 10/10/17 1100 10/10/17 1130  BP: 138/81 134/79 137/70 132/78  Pulse: 67 80 (!) 57 73  Resp: (!) 22 16 17 15   Temp:      TempSrc:      SpO2: 95% 95% 95% 95%   No intake or output data in the 24 hours ending 10/10/17 1232 There were no vitals filed for this visit. There is no height or weight on file to calculate BMI.   General: Elderly, NAD Skin: Warm, dry, intact  Head: Normocephalic, atraumatic, clear, moist mucus membranes. Neck: Negative for carotid bruits. No JVD Lungs: Fine crackles in R/L upper lobes. Breathing is unlabored. Cardiovascular: Irregularly irregular with S1 S2. MR + murmur, No rubs, gallops, or LV heave appreciated. Abdomen: Soft, non-tender, non-distended with normoactive bowel sounds. No obvious abdominal masses. MSK: Strength and tone appear normal for age. 5/5 in all extremities Extremities: 3+ BLE pitting edema. No clubbing or cyanosis. DP/PT pulses 1+ bilaterally Neuro:  Alert and oriented. No focal deficits. No facial asymmetry. MAE spontaneously. Psych: Responds to questions appropriately with normal affect.     EKG:  The EKG was personally reviewed and demonstrates: 10/09/17 Atrial fibrillation/atrial flutter HR 92 Telemetry:  Telemetry was personally reviewed and demonstrates: 10/10/17  Atrial fibrillation HR 84   Relevant CV Studies:  ECHO: 06/22/17 Study Conclusions  - Left ventricle: The cavity size was normal. Wall thickness was   increased in a pattern of moderate LVH. Systolic function was   normal. The estimated ejection fraction was in the range of 55%   to 60%. Wall motion was normal; there were no regional wall   motion abnormalities. - Aortic valve: There was mild regurgitation. - Aortic root: The aortic root was mildly dilated. - Ascending aorta: The ascending aorta was mildly dilated. - Mitral valve: Calcified annulus. There was mild regurgitation. - Left atrium: The atrium was severely dilated. - Right ventricle: The cavity size was mildly dilated. - Right atrium: The atrium was severely dilated. - Pulmonary arteries: Systolic pressure was mildly increased. PA   peak pressure: 50 mm Hg (S).  Impressions:  - Normal LV systolic function; mild AI; mildly dilated ascending   aorta (4.3 cm); mild MR; severe biatrial enlargement; mild RVE;   mild TR with mildly elevated pulmonary pressure.   CATH: 11/11/2006 HEMODYNAMIC RESULTS:  RA 20, RV 65/15, PA 65/31, pulmonary capillary  wedge pressure of 28, systemic  arterial pressure 190/117, LV   pressure 1902/19, LVEDP of 38, cardiac output 4.0, cardiac index at 20,  PA saturation 66%, AO saturation 93%.   CONCLUSIONS:  1. No significant CAD.  2. Moderately-depressed LV systolic function.  3. Moderate mitral regurgitation.  4. Mild bilateral renal artery stenosis.  5. Moderate severe pulmonary hypertension, with elevated pulmonary      capillary wedge pressure.  6. Systemic  hypertension.  7. Elevated LVEDP.  8. Cardiac output flow 4.0.  9. Cardiac index 2.8,  10.PA saturation 66%, AO saturation 93%.  Laboratory Data:  Chemistry Recent Labs  Lab 10/09/17 2020 10/10/17 0045  NA 137  --   K 5.5* 3.2*  CL 96*  --   CO2 27  --   GLUCOSE 102*  --   BUN 11  --   CREATININE 1.08*  --   CALCIUM 7.7*  --   GFRNONAA 46*  --   GFRAA 53*  --   ANIONGAP 14  --     Total Protein  Date Value Ref Range Status  11/07/2016 7.6 6.1 - 8.1 g/dL Final  12/03/2013 7.0 6.0 - 8.5 g/dL Final   Albumin  Date Value Ref Range Status  11/07/2016 3.6 3.6 - 5.1 g/dL Final  12/03/2013 4.3 3.5 - 4.7 g/dL Final   AST  Date Value Ref Range Status  11/07/2016 17 10 - 35 U/L Final   ALT  Date Value Ref Range Status  11/07/2016 10 6 - 29 U/L Final   Alkaline Phosphatase  Date Value Ref Range Status  11/07/2016 107 33 - 130 U/L Final   Total Bilirubin  Date Value Ref Range Status  11/07/2016 1.0 0.2 - 1.2 mg/dL Final   Hematology Recent Labs  Lab 10/09/17 2020  WBC 5.4  RBC 5.32*  HGB 14.6  HCT 42.1  MCV 79.1  MCH 27.4  MCHC 34.7  RDW 18.3*  PLT 155   Cardiac Enzymes Recent Labs  Lab 10/09/17 2308 10/10/17 0045 10/10/17 0344  TROPONINI 0.04* 0.03* 0.04*   No results for input(s): TROPIPOC in the last 168 hours.  BNP Recent Labs  Lab 10/09/17 2020  BNP 892.5*    DDimer No results for input(s): DDIMER in the last 168 hours. TSH:  Lab Results  Component Value Date   TSH 4.29 07/25/2017   Lipids: Lab Results  Component Value Date   CHOL 173 10/10/2017   HDL 37 (L) 10/10/2017   LDLCALC 119 (H) 10/10/2017   TRIG 87 10/10/2017   CHOLHDL 4.7 10/10/2017   HgbA1c: Lab Results  Component Value Date   HGBA1C 5.5 10/10/2017    Radiology/Studies:  Dg Chest Port 1 View  Result Date: 10/09/2017 CLINICAL DATA:  Shortness of breath.  Atrial fibrillation. EXAM: PORTABLE CHEST 1 VIEW COMPARISON:  Most recent radiograph 06/29/2017. Most recent  CT 01/11/2017 FINDINGS: Marked cardiomegaly, unchanged. Moderate bilateral pleural effusions, similar to prior exam. There is central pulmonary edema, with progression. No pneumothorax or new focal airspace disease. Multiple skin folds project over the left chest. IMPRESSION: Progressive pulmonary edema from prior exam. Chronic cardiomegaly and moderate bilateral pleural effusions. Electronically Signed   By: Jeb Levering M.D.   On: 10/09/2017 22:38   Assessment and Plan:   1.  Acute on chronic diastolic CHF: -Last echocardiogram 08/23/2016 with LVEF 55-60%, moderate LVH, no wall motion abnormalities, and LV diastolic dysfunction secondary to atrial fibrillation -IV Lasix 40mg  BID>>continue for now, will watch for diuresis and adjust as needed -Hydralazine 75mg   PO Q8H, IMDUR 30mg  PO BID>>continue -Will need ot monitor stricy daily weights and I&O -Fluid volume overloaded on exam, CXR with pul congestion and bilateral plural effusions, LE edema -Last office weight, 158 lb on 07/22/17  2.  History of non-obstructive CAD and elevated troponin: -Per cath report from 2008, no significant CAD -Trop, 0.04, 0.03, 0.04 -Denies current chest pain>>however reports intermittent CP over the last several weeks. Discussion was had about code status and aggressive versus conservative treatment options. She agrees with conservative therapies only. Continue DNR status  -Toprol-XL 25mg  PO QD -ASA 81,  3.  Atrial fibrillation with RVR with hx of chronic atrial fibrillation not on anticoagulation: -Diltiazem gtt>>>now off -Started on home dose of diltiazem 120mg  PO QD -HR maintaining in the 80-100's  -Per office note on 06/26/17 pt had uncontrolled HR and labetalol was increased to 300mg  PO BID. This was not restarted. BP and HR currently controlled. -Not an anticoagulation candidate secondary to GI bleed and epistaxis in the past on Coumadin and Eliquis  -CHA2DS2VASc =at least 9  4.  Hypothyroidism: -TSH  07/25/17>>4.29 -Levothyroxine 45mcg  5.  Hyperkalemia: -K+ 5.5 on admission>>> improved to 3.2 on repeat lab -Tele stable  6. Hx of moderate MR: -Per echo in early 2018  7. Hx of ascending thoracic aneurysm: - 4.8 cm on the recent image, not a candidate for operation.  There has been some discussion of palliative care consult.   For questions or updates, please contact [facility] Please consult [email or web address] for contact info under Cardiology/STEMI.   SignedKathyrn Drown NP-C [place] Pager: [phone] 10/10/2017 12:32 PM

## 2017-10-10 NOTE — Progress Notes (Signed)
  Echocardiogram 2D Echocardiogram has been performed.  Johny Chess 10/10/2017, 4:53 PM

## 2017-10-10 NOTE — Care Management Note (Signed)
Case Management Note  Patient Details  Name: Nicole [name] MRN: [id number] Date of Birth: [birth year]  Subjective/Objective:                  84 y.o. female with medical history significant of hypertension, hyperlipidemia, diet-controlled diabetes, stroke, GERD, hypothyroidism, gout, depression, anxiety, thoracic aneurysm, AAA, colon cancer, atrial fibrillation not on anticoagulants, PE, ICH, dementia, who presents with shortness breath and leg edema. From home with significant other Nicole [name]) and PCA (5 hours).  Action/Plan: Admit status INPATIENT (CHF); anticipate discharge [facility].   Expected Discharge Date:  (unnown)               Expected Discharge Plan:  [place]  In-House Referral:     Discharge planning Services  CM Consult  Post Acute Care Choice:    Choice offered to:     DME Arranged:    DME Agency:     HH Arranged:    HH Agency:     Status of Service:  In process, will continue to follow  If discussed at Long Length of Stay Meetings, dates discussed:    Additional Comments: Pt was active with [facility] in the past.  Pt said to be non-compliant with diet (eats what she wants regardless of education) and monitoring weight.  Pt significant other cooks whatever pt requests.  Nicole Mandril, RN 10/10/2017, 11:07 AM

## 2017-10-10 NOTE — Progress Notes (Signed)
PROGRESS NOTE    [name]  IWP:[id number] DOB: [birth year] DOA: 10/09/2017 PCP: [name], DO   Brief Narrative: Nicole [name] is a 84 y.o. female with a history of hypertension, hyperlipidemia, diet-controlled diabetes, stroke, GERD, hypothyroidism, gout, depression, anxiety, thoracic aneurysm, AAA, colon cancer, atrial fibrillation not on anticoagulants, PE, ICH, dementia. She presented with worsening dyspnea and leg edema found to have CHF exacerbation and atrial fibrillation with RVR, started on IV lasix and Cardizem drip.   Assessment & Plan:   Principal Problem:   Acute on chronic diastolic CHF (congestive heart failure) (HCC) Active Problems:   Atrial fibrillation with RVR (HCC)   Permanent atrial fibrillation (HCC)   Major depression   Hypothyroidism   Hypertension   Gout, unspecified   Hyperkalemia   Dementia   CAD (coronary artery disease)   Elevated troponin   Acute on chronic systolic heart failure Patient with a history of systolic heart failure, however, last EF was normal. Acute illness likely secondary to tachycardia (afib vs flutter). Symptoms improved with Lasix. -Continue Lasix -Daily weight and strict in/out -Will consult cardiology in setting of associated flutter (appears to be new)  Atrial fibrillation/flutter Not on anticoagulation secondary to history of ICH. Follows with Dr. Debara Pickett as an outpatient. Started on Cardizem per primary care physician, but does have a history of reduced EF (last EF normal). Cardizem drip overnight with transition to oral this AM. Rate is currently controlled. -Continue Cardizem -Cardiology recommendations  History of CAD No chest pain -Continue Aspirin.  Elevated troponin In setting of atrial flutter/heart failure. No chest pain. EKG not suggestive of ACS. Troponin with flat trend.  Gout Stable -continue allopurinol  Hypothyroidism -Continue Synthroid  Hyperkalemia Resolved with repeat  BMP.    DVT prophylaxis: Lovenox Code Status:   Code Status: DNR Family Communication: Husband at bedside Disposition Plan: Discharge home vs SNF when heart failure and atrial flutter better controlled.   Consultants:   Cardiology  Procedures:   None  Antimicrobials:  None    Subjective: Patient is breathing better. No distress  Objective: Vitals:   10/10/17 0945 10/10/17 1045 10/10/17 1100 10/10/17 1130  BP: 138/81 134/79 137/70 132/78  Pulse: 67 80 (!) 57 73  Resp: (!) 22 16 17 15   Temp:      TempSrc:      SpO2: 95% 95% 95% 95%   No intake or output data in the 24 hours ending 10/10/17 1155 There were no vitals filed for this visit.  Examination:  General exam: Appears calm and comfortable Respiratory system: Diminished breath sounds. Respiratory effort normal. Cardiovascular system: S1 & S2 heard, RRR. No murmurs. Gastrointestinal system: Abdomen is nondistended, soft and nontender. Normal bowel sounds heard. Central nervous system: Alert and oriented. No focal neurological deficits. Extremities: 2+ edema. No calf tenderness Skin: No cyanosis. No rashes Psychiatry: Judgement and insight appear normal. Mood & affect appropriate.     Data Reviewed: I have personally reviewed following labs and imaging studies  CBC: Recent Labs  Lab 10/09/17 2020  WBC 5.4  NEUTROABS 4.4  HGB 14.6  HCT 42.1  MCV 79.1  PLT 505   Basic Metabolic Panel: Recent Labs  Lab 10/09/17 2020 10/10/17 0045  NA 137  --   K 5.5* 3.2*  CL 96*  --   CO2 27  --   GLUCOSE 102*  --   BUN 11  --   CREATININE 1.08*  --   CALCIUM 7.7*  --  GFR: CrCl cannot be calculated (Unknown ideal weight.). Liver Function Tests: No results for input(s): AST, ALT, ALKPHOS, BILITOT, PROT, ALBUMIN in the last 168 hours. No results for input(s): LIPASE, AMYLASE in the last 168 hours. No results for input(s): AMMONIA in the last 168 hours. Coagulation Profile: No results for input(s):  INR, PROTIME in the last 168 hours. Cardiac Enzymes: Recent Labs  Lab 10/09/17 2308 10/10/17 0045 10/10/17 0344  TROPONINI 0.04* 0.03* 0.04*   BNP (last 3 results) No results for input(s): PROBNP in the last 8760 hours. HbA1C: Recent Labs    10/10/17 0343  HGBA1C 5.5   CBG: No results for input(s): GLUCAP in the last 168 hours. Lipid Profile: Recent Labs    10/10/17 0344  CHOL 173  HDL 37*  LDLCALC 119*  TRIG 87  CHOLHDL 4.7   Thyroid Function Tests: No results for input(s): TSH, T4TOTAL, FREET4, T3FREE, THYROIDAB in the last 72 hours. Anemia Panel: No results for input(s): VITAMINB12, FOLATE, FERRITIN, TIBC, IRON, RETICCTPCT in the last 72 hours. Sepsis Labs: No results for input(s): PROCALCITON, LATICACIDVEN in the last 168 hours.  No results found for this or any previous visit (from the past 240 hour(s)).       Radiology Studies: Dg Chest Port 1 View  Result Date: 10/09/2017 CLINICAL DATA:  Shortness of breath.  Atrial fibrillation. EXAM: PORTABLE CHEST 1 VIEW COMPARISON:  Most recent radiograph 06/29/2017. Most recent CT 01/11/2017 FINDINGS: Marked cardiomegaly, unchanged. Moderate bilateral pleural effusions, similar to prior exam. There is central pulmonary edema, with progression. No pneumothorax or new focal airspace disease. Multiple skin folds project over the left chest. IMPRESSION: Progressive pulmonary edema from prior exam. Chronic cardiomegaly and moderate bilateral pleural effusions. Electronically Signed   By: Jeb Levering M.D.   On: 10/09/2017 22:38        Scheduled Meds: . allopurinol  100 mg Oral Daily  . aspirin EC  81 mg Oral Daily  . diltiazem  120 mg Oral Daily  . donepezil  10 mg Oral QHS  . enoxaparin (LOVENOX) injection  40 mg Subcutaneous Daily  . ferrous sulfate  325 mg Oral BID WC  . furosemide  40 mg Intravenous BID  . hydrALAZINE  75 mg Oral Q8H  . ipratropium  0.5 mg Nebulization Q6H  . isosorbide mononitrate  30 mg  Oral BID  . levalbuterol  1.25 mg Nebulization Q6H  . levothyroxine  25 mcg Oral QAC breakfast  . metoprolol succinate  25 mg Oral Daily  . sertraline  50 mg Oral Daily  . sodium chloride flush  3 mL Intravenous Q12H   Continuous Infusions: . sodium chloride    . diltiazem (CARDIZEM) infusion 7.5 mg/hr (10/09/17 2330)     LOS: 0 days     Cordelia Poche, MD [facility] 10/10/2017, 11:55 AM Pager: [phone]  If 7PM-7AM, please contact night-coverage [email or web address] Password [facility] 10/10/2017, 11:55 AM

## 2017-10-10 NOTE — ED Notes (Signed)
Pt c.o pain on her bottom, pt repositioned with pillow for comfort.

## 2017-10-11 ENCOUNTER — Other Ambulatory Visit: Payer: Self-pay

## 2017-10-11 DIAGNOSIS — L8990 Pressure ulcer of unspecified site, unspecified stage: Secondary | ICD-10-CM

## 2017-10-11 DIAGNOSIS — I2510 Atherosclerotic heart disease of native coronary artery without angina pectoris: Secondary | ICD-10-CM

## 2017-10-11 DIAGNOSIS — J810 Acute pulmonary edema: Secondary | ICD-10-CM

## 2017-10-11 LAB — BASIC METABOLIC PANEL
ANION GAP: 12 (ref 5–15)
BUN: 15 mg/dL (ref 6–20)
CALCIUM: 7.7 mg/dL — AB (ref 8.9–10.3)
CO2: 32 mmol/L (ref 22–32)
CREATININE: 1.29 mg/dL — AB (ref 0.44–1.00)
Chloride: 96 mmol/L — ABNORMAL LOW (ref 101–111)
GFR calc Af Amer: 43 mL/min — ABNORMAL LOW (ref >60)
GFR, EST NON AFRICAN AMERICAN: 37 mL/min — AB (ref >60)
GLUCOSE: 137 mg/dL — AB (ref 65–99)
Potassium: 3.7 mmol/L (ref 3.5–5.1)
Sodium: 140 mmol/L (ref 135–145)

## 2017-10-11 MED ORDER — IPRATROPIUM BROMIDE 0.02 % IN SOLN
0.5000 mg | Freq: Four times a day (QID) | RESPIRATORY_TRACT | Status: DC
  Administered 2017-10-11 (×2): 0.5 mg via RESPIRATORY_TRACT
  Filled 2017-10-11 (×2): qty 2.5

## 2017-10-11 MED ORDER — LEVALBUTEROL HCL 0.63 MG/3ML IN NEBU: 0.6300 mg | INHALATION_SOLUTION | Freq: Four times a day (QID) | RESPIRATORY_TRACT | Status: DC | PRN

## 2017-10-11 MED ORDER — IPRATROPIUM BROMIDE 0.02 % IN SOLN
0.5000 mg | Freq: Two times a day (BID) | RESPIRATORY_TRACT | Status: DC
  Administered 2017-10-11: 0.5 mg via RESPIRATORY_TRACT
  Filled 2017-10-11: qty 2.5

## 2017-10-11 MED ORDER — LEVALBUTEROL HCL 1.25 MG/0.5ML IN NEBU
1.2500 mg | INHALATION_SOLUTION | Freq: Two times a day (BID) | RESPIRATORY_TRACT | Status: DC
  Administered 2017-10-11: 1.25 mg via RESPIRATORY_TRACT
  Filled 2017-10-11: qty 0.5

## 2017-10-11 MED ORDER — LEVALBUTEROL HCL 1.25 MG/0.5ML IN NEBU
1.2500 mg | INHALATION_SOLUTION | Freq: Four times a day (QID) | RESPIRATORY_TRACT | Status: DC
  Administered 2017-10-11 (×2): 1.25 mg via RESPIRATORY_TRACT
  Filled 2017-10-11 (×2): qty 0.5

## 2017-10-11 NOTE — Plan of Care (Signed)
  Problem: Pain Managment: Goal: General experience of comfort will improve Outcome: Completed/Met  Patient denies pain. Displays no signs or symptoms of distress 

## 2017-10-11 NOTE — Progress Notes (Signed)
Progress Note  Patient Name: Nicole [name] Date of Encounter: 10/11/2017  Primary Cardiologist: Dr. Debara Pickett  Subjective   Feeling well. No chest pain, sob or palpitations. Edema improving.   Inpatient Medications    Scheduled Meds: . allopurinol  100 mg Oral Daily  . aspirin EC  81 mg Oral Daily  . diltiazem  120 mg Oral Daily  . donepezil  10 mg Oral QHS  . enoxaparin (LOVENOX) injection  40 mg Subcutaneous Daily  . ferrous sulfate  325 mg Oral BID WC  . furosemide  40 mg Intravenous BID  . hydrALAZINE  75 mg Oral Q8H  . ipratropium  0.5 mg Nebulization QID  . isosorbide mononitrate  30 mg Oral BID  . levalbuterol  1.25 mg Nebulization QID  . levothyroxine  25 mcg Oral QAC breakfast  . metoprolol succinate  25 mg Oral Daily  . sertraline  50 mg Oral Daily  . sodium chloride flush  3 mL Intravenous Q12H   Continuous Infusions: . sodium chloride     PRN Meds: sodium chloride, acetaminophen, dextromethorphan-guaiFENesin, hydrALAZINE, hydrOXYzine, morphine injection, nitroGLYCERIN, sodium chloride flush, zolpidem   Vital Signs    Vitals:   10/11/17 0417 10/11/17 0525 10/11/17 0811 10/11/17 0813  BP: 131/80 (!) 141/81  (!) 151/85  Pulse: 74   86  Resp: 18     Temp: 97.9 F (36.6 C)   98.8 F (37.1 C)  TempSrc: Oral   Oral  SpO2: 99%  99% 99%  Weight: 143 lb 4.8 oz (65 kg)       Intake/Output Summary (Last 24 hours) at 10/11/2017 1194 Last data filed at 10/11/2017 0418 Gross per 24 hour  Intake -  Output 200 ml  Net -200 ml   Filed Weights   10/11/17 0417  Weight: 143 lb 4.8 oz (65 kg)    Telemetry    AFib at rate of 70s - Personally Reviewed  ECG    N/A  Physical Exam   GEN: elderly female in no acute distress on nasal cannula Neck: No JVD Cardiac: IR Ir , + murmurs, rubs, or gallops.  Respiratory: Clear to auscultation bilaterally relatively GI: Soft, nontender, non-distended  MS: 1+ edema L > R; No deformity. Neuro:  Nonfocal  Psych:  Normal affect   Labs    Chemistry Recent Labs  Lab 10/10/17 1701 10/10/17 1841 10/11/17 0359  NA 137 140 140  K 7.2* 2.7* 3.7  CL 96* 95* 96*  CO2 29 32 32  GLUCOSE 114* 160* 137*  BUN 15 14 15   CREATININE 1.29* 1.36* 1.29*  CALCIUM 7.6* 7.8* 7.7*  GFRNONAA 37* 35* 37*  GFRAA 43* 40* 43*  ANIONGAP 12 13 12      Hematology Recent Labs  Lab 10/09/17 2020  WBC 5.4  RBC 5.32*  HGB 14.6  HCT 42.1  MCV 79.1  MCH 27.4  MCHC 34.7  RDW 18.3*  PLT 155    Cardiac Enzymes Recent Labs  Lab 10/09/17 2308 10/10/17 0045 10/10/17 0344  TROPONINI 0.04* 0.03* 0.04*   No results for input(s): TROPIPOC in the last 168 hours.   BNP Recent Labs  Lab 10/09/17 2020  BNP 892.5*     DDimer No results for input(s): DDIMER in the last 168 hours.   Radiology    Dg Chest Port 1 View  Result Date: 10/09/2017 CLINICAL DATA:  Shortness of breath.  Atrial fibrillation. EXAM: PORTABLE CHEST 1 VIEW COMPARISON:  Most recent radiograph 06/29/2017. Most recent CT  01/11/2017 FINDINGS: Marked cardiomegaly, unchanged. Moderate bilateral pleural effusions, similar to prior exam. There is central pulmonary edema, with progression. No pneumothorax or new focal airspace disease. Multiple skin folds project over the left chest. IMPRESSION: Progressive pulmonary edema from prior exam. Chronic cardiomegaly and moderate bilateral pleural effusions. Electronically Signed   By: Jeb Levering M.D.   On: 10/09/2017 22:38    Cardiac Studies   Echo 10/10/17 Study Conclusions  - Left ventricle: The cavity size was normal. Wall thickness was   normal. Systolic function was normal. The estimated ejection   fraction was in the range of 55% to 60%. - Mitral valve: There was mild regurgitation. - Left atrium: The atrium was moderately dilated. - Right ventricle: The cavity size was mildly dilated. Systolic   function was mildly to moderately reduced. - Right atrium: The atrium was severely dilated. -  Tricuspid valve: There was moderate regurgitation. - Pulmonic valve: There was moderate regurgitation. - Pericardium, extracardiac: A trivial pericardial effusion was   identified. There was a left pleural effusion.  Patient Profile     Nicole [name] is a 84 y.o. female with a hx of chronic atrial fibrillation with controlled ventricular response not on anticoagulation secondary to GI/epistaxis bleed, chronic diastolic heart failure LVEF 55-60% (recovered from 30-35% in 2008), HTN, HLD, diet-controlled DM, CVA, hypothyroidism, dementia, ascending thoracic aortic aneurysm (4.8 cm ascending thoracic aortic aneurysm - not surgical candidate) and GERD who seen for the evaluation of atrial fibrillation with RVR, acute CHF and elevated troponin.   Assessment & Plan    1. Acute on chronic diastolic CHF - echo this admission showed stable LVEF of 55-60% and valvular abnormality. I & O has not been recorded accurately. No daily weight as well. She says that edema and breathing has been improving.  - Continue IV lasix 40mg  BID, Toprol XL 25mg . Hydralazine 75mg  TID and Imdur 30mg  QD. Not on ACE/ARB due to CKD.  - Will make strict I & O and daily weight.   2. Persistent atrial fibrillation - rate improved on IV Cardizem now stable on PO Cardizem.  - Continue CCB and BB. No anticoagulation due to prior bleed.   3. Elevated troponin - flat trend. Demand in setting of acute CHF and rapid rate. Not candidate for cath.   4. Hx of ascending thoracic aneurysm - 4.8 cm on the recent image, not a candidate for operation.    For questions or updates, please contact [place] Please consult [email or web address] for contact info under Cardiology/STEMI.      Jarrett Soho, PA  10/11/2017, 8:37 AM

## 2017-10-11 NOTE — Progress Notes (Signed)
PROGRESS NOTE    Nicole [name]  MCN:[id number] DOB: [birth year] DOA: 10/09/2017 PCP: [name], DO   Brief Narrative: Nicole [name] is a 84 y.o. female with a history of hypertension, hyperlipidemia, diet-controlled diabetes, stroke, GERD, hypothyroidism, gout, depression, anxiety, thoracic aneurysm, AAA, colon cancer, atrial fibrillation not on anticoagulants, PE, ICH, dementia. She presented with worsening dyspnea and leg edema found to have CHF exacerbation and atrial fibrillation with RVR, started on IV lasix and Cardizem drip.   Assessment & Plan:   Principal Problem:   Acute on chronic diastolic CHF (congestive heart failure) (HCC) Active Problems:   Atrial fibrillation with RVR (HCC)   Permanent atrial fibrillation (HCC)   Major depression   Hypothyroidism   Hypertension   Gout, unspecified   Hyperkalemia   Dementia   CAD (coronary artery disease)   Elevated troponin   Acute on chronic systolic heart failure Patient with a history of systolic heart failure, however, last EF was normal. Acute illness likely secondary to tachycardia (afib vs flutter). Symptoms improved with Lasix. No weight from yesterday, however, caregiver stated she thought dry weight was around 153-154 lbs. Last recorded weight in January of 162 lbs and lowest weight of 134 lbs 10-05/2017. Transthoracic Echocardiogram significant for normal EF of left ventricle and mild-moderate reduced RV function with mild-moderate regurgitation of all heart valves. -Continue Lasix -Daily weight and strict in/out -PT eval  Atrial fibrillation/flutter Not on anticoagulation secondary to history of ICH. Follows with Dr. Debara Pickett as an outpatient. Started on Cardizem per primary care physician, but does have a history of reduced EF (last EF normal). Transitioned from Cardizem drip to oral. Rate controlled. -Continue Cardizem -Cardiology recommendations  History of CAD No chest pain -Continue  Aspirin.  Elevated troponin In setting of atrial flutter/heart failure. No chest pain. EKG not suggestive of ACS. Troponin with flat trend.  Gout Stable -continue allopurinol  Hypothyroidism -Continue Synthroid  Hyperkalemia Resolved with repeat BMP.    DVT prophylaxis: Lovenox Code Status:   Code Status: DNR Family Communication: Husband at bedside Disposition Plan: Discharge home vs SNF when heart failure and atrial flutter better controlled.   Consultants:   Cardiology  Procedures:   Transthoracic Echocardiogram (10/10/2017) Study Conclusions  - Left ventricle: The cavity size was normal. Wall thickness was   normal. Systolic function was normal. The estimated ejection   fraction was in the range of 55% to 60%. - Mitral valve: There was mild regurgitation. - Left atrium: The atrium was moderately dilated. - Right ventricle: The cavity size was mildly dilated. Systolic   function was mildly to moderately reduced. - Right atrium: The atrium was severely dilated. - Tricuspid valve: There was moderate regurgitation. - Pulmonic valve: There was moderate regurgitation. - Pericardium, extracardiac: A trivial pericardial effusion was   identified. There was a left pleural effusion.  Antimicrobials:  None    Subjective: Breathing better. No chest pain.  Objective: Vitals:   10/11/17 0417 10/11/17 0525 10/11/17 0811 10/11/17 0813  BP: 131/80 (!) 141/81  (!) 151/85  Pulse: 74   86  Resp: 18     Temp: 97.9 F (36.6 C)   98.8 F (37.1 C)  TempSrc: Oral   Oral  SpO2: 99%  99% 99%  Weight: 65 kg (143 lb 4.8 oz)       Intake/Output Summary (Last 24 hours) at 10/11/2017 1009 Last data filed at 10/11/2017 0418 Gross per 24 hour  Intake -  Output 200 ml  Net -200  ml   Filed Weights   10/11/17 0417  Weight: 65 kg (143 lb 4.8 oz)    Examination:  General exam: Appears calm and comfortable Respiratory system: Diminished with normal effort Cardiovascular  system: Irregular rate and rhythm. Normal S1 and S2. 2/6 systolic murmur. No extra heart sounds Gastrointestinal system: Abdomen is nondistended, soft and nontender. Normal bowel sounds heard. Central nervous system: Alert and oriented. No focal neurological deficits. Extremities: 1-2+ edema of LE. No calf tenderness Skin: No cyanosis. No rashes Psychiatry: Judgement and insight appear normal. Mood & affect appropriate.     Data Reviewed: I have personally reviewed following labs and imaging studies  CBC: Recent Labs  Lab 10/09/17 2020  WBC 5.4  NEUTROABS 4.4  HGB 14.6  HCT 42.1  MCV 79.1  PLT 403   Basic Metabolic Panel: Recent Labs  Lab 10/09/17 2020 10/10/17 0045 10/10/17 1701 10/10/17 1841 10/11/17 0359  NA 137  --  137 140 140  K 5.5* 3.2* 7.2* 2.7* 3.7  CL 96*  --  96* 95* 96*  CO2 27  --  29 32 32  GLUCOSE 102*  --  114* 160* 137*  BUN 11  --  15 14 15   CREATININE 1.08*  --  1.29* 1.36* 1.29*  CALCIUM 7.7*  --  7.6* 7.8* 7.7*   GFR: Estimated Creatinine Clearance: 32.7 mL/min (A) (by C-G formula based on SCr of 1.29 mg/dL (H)). Liver Function Tests: No results for input(s): AST, ALT, ALKPHOS, BILITOT, PROT, ALBUMIN in the last 168 hours. No results for input(s): LIPASE, AMYLASE in the last 168 hours. No results for input(s): AMMONIA in the last 168 hours. Coagulation Profile: No results for input(s): INR, PROTIME in the last 168 hours. Cardiac Enzymes: Recent Labs  Lab 10/09/17 2308 10/10/17 0045 10/10/17 0344  TROPONINI 0.04* 0.03* 0.04*   BNP (last 3 results) No results for input(s): PROBNP in the last 8760 hours. HbA1C: Recent Labs    10/10/17 0343  HGBA1C 5.5   CBG: No results for input(s): GLUCAP in the last 168 hours. Lipid Profile: Recent Labs    10/10/17 0344  CHOL 173  HDL 37*  LDLCALC 119*  TRIG 87  CHOLHDL 4.7   Thyroid Function Tests: No results for input(s): TSH, T4TOTAL, FREET4, T3FREE, THYROIDAB in the last 72  hours. Anemia Panel: No results for input(s): VITAMINB12, FOLATE, FERRITIN, TIBC, IRON, RETICCTPCT in the last 72 hours. Sepsis Labs: No results for input(s): PROCALCITON, LATICACIDVEN in the last 168 hours.  No results found for this or any previous visit (from the past 240 hour(s)).       Radiology Studies: Dg Chest Port 1 View  Result Date: 10/09/2017 CLINICAL DATA:  Shortness of breath.  Atrial fibrillation. EXAM: PORTABLE CHEST 1 VIEW COMPARISON:  Most recent radiograph 06/29/2017. Most recent CT 01/11/2017 FINDINGS: Marked cardiomegaly, unchanged. Moderate bilateral pleural effusions, similar to prior exam. There is central pulmonary edema, with progression. No pneumothorax or new focal airspace disease. Multiple skin folds project over the left chest. IMPRESSION: Progressive pulmonary edema from prior exam. Chronic cardiomegaly and moderate bilateral pleural effusions. Electronically Signed   By: Jeb Levering M.D.   On: 10/09/2017 22:38        Scheduled Meds: . allopurinol  100 mg Oral Daily  . aspirin EC  81 mg Oral Daily  . diltiazem  120 mg Oral Daily  . donepezil  10 mg Oral QHS  . enoxaparin (LOVENOX) injection  40 mg Subcutaneous Daily  .  ferrous sulfate  325 mg Oral BID WC  . furosemide  40 mg Intravenous BID  . hydrALAZINE  75 mg Oral Q8H  . ipratropium  0.5 mg Nebulization QID  . isosorbide mononitrate  30 mg Oral BID  . levalbuterol  1.25 mg Nebulization QID  . levothyroxine  25 mcg Oral QAC breakfast  . metoprolol succinate  25 mg Oral Daily  . sertraline  50 mg Oral Daily  . sodium chloride flush  3 mL Intravenous Q12H   Continuous Infusions: . sodium chloride       LOS: 1 day     Cordelia Poche, MD [facility] 10/11/2017, 10:09 AM Pager: [phone]  If 7PM-7AM, please contact night-coverage [email or web address] Password [facility] 10/11/2017, 10:09 AM

## 2017-10-12 DIAGNOSIS — I482 Chronic atrial fibrillation: Secondary | ICD-10-CM

## 2017-10-12 LAB — BASIC METABOLIC PANEL
ANION GAP: 11 (ref 5–15)
BUN: 12 mg/dL (ref 6–20)
CALCIUM: 7.6 mg/dL — AB (ref 8.9–10.3)
CHLORIDE: 94 mmol/L — AB (ref 101–111)
CO2: 35 mmol/L — ABNORMAL HIGH (ref 22–32)
CREATININE: 1.23 mg/dL — AB (ref 0.44–1.00)
GFR calc non Af Amer: 39 mL/min — ABNORMAL LOW (ref >60)
GFR, EST AFRICAN AMERICAN: 45 mL/min — AB (ref >60)
Glucose, Bld: 78 mg/dL (ref 65–99)
Potassium: 3.1 mmol/L — ABNORMAL LOW (ref 3.5–5.1)
SODIUM: 140 mmol/L (ref 135–145)

## 2017-10-12 MED ORDER — IPRATROPIUM BROMIDE 0.02 % IN SOLN: 0.5000 mg | Freq: Two times a day (BID) | RESPIRATORY_TRACT | Status: DC | PRN

## 2017-10-12 MED ORDER — POTASSIUM CHLORIDE CRYS ER 20 MEQ PO TBCR
40.0000 meq | EXTENDED_RELEASE_TABLET | Freq: Once | ORAL | Status: AC
  Administered 2017-10-12: 40 meq via ORAL
  Filled 2017-10-12: qty 2

## 2017-10-12 NOTE — Progress Notes (Signed)
PROGRESS NOTE    TAYLORE HINDE  MGQ:[id number] DOB: June 12, 1931 DOA: 10/09/2017 PCP: [name], DO   Brief Narrative: Nicole [name] is a 84 y.o. female with a history of hypertension, hyperlipidemia, diet-controlled diabetes, stroke, GERD, hypothyroidism, gout, depression, anxiety, thoracic aneurysm, AAA, colon cancer, atrial fibrillation not on anticoagulants, PE, ICH, dementia. She presented with worsening dyspnea and leg edema found to have CHF exacerbation and atrial fibrillation with RVR, started on IV lasix and Cardizem drip. No controlled on Cardizem PO.   Assessment & Plan:   Principal Problem:   Acute on chronic diastolic CHF (congestive heart failure) (HCC) Active Problems:   Atrial fibrillation with RVR (HCC)   Permanent atrial fibrillation (HCC)   Major depression   Hypothyroidism   Hypertension   Gout, unspecified   Hyperkalemia   Dementia   CAD (coronary artery disease)   Elevated troponin   Pressure injury of skin   Acute on chronic systolic heart failure Patient with a history of systolic heart failure, however, last EF was normal. Acute illness likely secondary to tachycardia (afib vs flutter). Symptoms improved with Lasix. No weight from yesterday, however, caregiver stated she thought dry weight was around 153-154 lbs. Last recorded weight in January of 162 lbs and lowest weight of 134 lbs 10-05/2017. Transthoracic Echocardiogram significant for normal EF of left ventricle and mild-moderate reduced RV function with mild-moderate regurgitation of all heart valves. Weight down 5 lbs from 3/22 with 1.6 L UOP over the last 24 hours -Continue Lasix per cardiology -Daily weight and strict in/out -PT eval pending  Atrial fibrillation/flutter Not on anticoagulation secondary to history of ICH. Follows with Dr. Debara Pickett as an outpatient. Started on Cardizem per primary care physician, but does have a history of reduced EF (last EF normal). Transitioned from  Cardizem drip to oral. Rate controlled. -Continue Cardizem -Cardiology recommendations  History of CAD No chest pain -Continue Aspirin.  Elevated troponin In setting of atrial flutter/heart failure. No chest pain. EKG not suggestive of ACS. Troponin with flat trend.  Gout Stable -continue allopurinol  Hypothyroidism -Continue Synthroid  Hyperkalemia Hypokalemia Hyper kalemia resolved with repeat BMP. Hypokalemia treated with supplemental potassium. Secondary to diuresis.    DVT prophylaxis: Lovenox Code Status:   Code Status: DNR Family Communication: None at bedside Disposition Plan: Discharge home vs SNF when heart failure and atrial flutter better controlled.   Consultants:   Cardiology  Procedures:   Transthoracic Echocardiogram (10/10/2017) Study Conclusions  - Left ventricle: The cavity size was normal. Wall thickness was   normal. Systolic function was normal. The estimated ejection   fraction was in the range of 55% to 60%. - Mitral valve: There was mild regurgitation. - Left atrium: The atrium was moderately dilated. - Right ventricle: The cavity size was mildly dilated. Systolic   function was mildly to moderately reduced. - Right atrium: The atrium was severely dilated. - Tricuspid valve: There was moderate regurgitation. - Pulmonic valve: There was moderate regurgitation. - Pericardium, extracardiac: A trivial pericardial effusion was   identified. There was a left pleural effusion.  Antimicrobials:  None    Subjective: No issues this morning  Objective: Vitals:   10/11/17 2016 10/12/17 0014 10/12/17 0350 10/12/17 0804  BP:  (!) 146/84 140/73 (!) 152/77  Pulse: 86 75 68   Resp: 18 16 13    Temp:  (!) 97.5 F (36.4 C) (!) 97.5 F (36.4 C) 98.3 F (36.8 C)  TempSrc:  Oral Oral Oral  SpO2: 99% 99%  100% 99%  Weight:   62.9 kg (138 lb 10.7 oz)     Intake/Output Summary (Last 24 hours) at 10/12/2017 1557 Last data filed at 10/12/2017  1425 Gross per 24 hour  Intake 1440 ml  Output 2500 ml  Net -1060 ml   Filed Weights   10/11/17 0417 10/12/17 0350  Weight: 65 kg (143 lb 4.8 oz) 62.9 kg (138 lb 10.7 oz)    Examination:  General exam: Appears calm and comfortable Respiratory system: Diminished with normal effort Cardiovascular system: Irregular rate and rhythm. Normal S1 and S2. 2/6 systolic murmur. No extra heart sounds Gastrointestinal system: Abdomen is nondistended, soft and nontender. Normal bowel sounds heard. Central nervous system: Alert and oriented to person and place. No focal neurological deficits. Extremities: trace to 1+ edema of LE. No calf tenderness Skin: No cyanosis. No rashes Psychiatry: Judgement and insight appear impaired. Flat affect.     Data Reviewed: I have personally reviewed following labs and imaging studies  CBC: Recent Labs  Lab 10/09/17 2020  WBC 5.4  NEUTROABS 4.4  HGB 14.6  HCT 42.1  MCV 79.1  PLT 626   Basic Metabolic Panel: Recent Labs  Lab 10/09/17 2020 10/10/17 0045 10/10/17 1701 10/10/17 1841 10/11/17 0359 10/12/17 0505  NA 137  --  137 140 140 140  K 5.5* 3.2* 7.2* 2.7* 3.7 3.1*  CL 96*  --  96* 95* 96* 94*  CO2 27  --  29 32 32 35*  GLUCOSE 102*  --  114* 160* 137* 78  BUN 11  --  15 14 15 12   CREATININE 1.08*  --  1.29* 1.36* 1.29* 1.23*  CALCIUM 7.7*  --  7.6* 7.8* 7.7* 7.6*   GFR: Estimated Creatinine Clearance: 33.8 mL/min (A) (by C-G formula based on SCr of 1.23 mg/dL (H)). Liver Function Tests: No results for input(s): AST, ALT, ALKPHOS, BILITOT, PROT, ALBUMIN in the last 168 hours. No results for input(s): LIPASE, AMYLASE in the last 168 hours. No results for input(s): AMMONIA in the last 168 hours. Coagulation Profile: No results for input(s): INR, PROTIME in the last 168 hours. Cardiac Enzymes: Recent Labs  Lab 10/09/17 2308 10/10/17 0045 10/10/17 0344  TROPONINI 0.04* 0.03* 0.04*   BNP (last 3 results) No results for input(s):  PROBNP in the last 8760 hours. HbA1C: Recent Labs    10/10/17 0343  HGBA1C 5.5   CBG: No results for input(s): GLUCAP in the last 168 hours. Lipid Profile: Recent Labs    10/10/17 0344  CHOL 173  HDL 37*  LDLCALC 119*  TRIG 87  CHOLHDL 4.7   Thyroid Function Tests: No results for input(s): TSH, T4TOTAL, FREET4, T3FREE, THYROIDAB in the last 72 hours. Anemia Panel: No results for input(s): VITAMINB12, FOLATE, FERRITIN, TIBC, IRON, RETICCTPCT in the last 72 hours. Sepsis Labs: No results for input(s): PROCALCITON, LATICACIDVEN in the last 168 hours.  No results found for this or any previous visit (from the past 240 hour(s)).       Radiology Studies: No results found.      Scheduled Meds: . allopurinol  100 mg Oral Daily  . aspirin EC  81 mg Oral Daily  . diltiazem  120 mg Oral Daily  . donepezil  10 mg Oral QHS  . enoxaparin (LOVENOX) injection  40 mg Subcutaneous Daily  . ferrous sulfate  325 mg Oral BID WC  . furosemide  40 mg Intravenous BID  . hydrALAZINE  75 mg Oral Q8H  . isosorbide  mononitrate  30 mg Oral BID  . levothyroxine  25 mcg Oral QAC breakfast  . metoprolol succinate  25 mg Oral Daily  . sertraline  50 mg Oral Daily  . sodium chloride flush  3 mL Intravenous Q12H   Continuous Infusions: . sodium chloride       LOS: 2 days     Cordelia Poche, MD [facility] 10/12/2017, 3:57 PM Pager: [phone]  If 7PM-7AM, please contact night-coverage [email or web address] Password [facility] 10/12/2017, 3:57 PM

## 2017-10-12 NOTE — Progress Notes (Addendum)
   10/12/17 2300  PT Visit Information  Last PT Received On 10/13/17  Assistance Needed +1  History of Present Illness 84 yo female with onset of a-flutter and troponins are mild, PMHx:  chf, EF is 55-60, PMHx:  DM, CVA, cardiomyopathy,dementia, CHF, ascending thoracic aortic aneurysm is   Precautions  Precautions Fall  Restrictions  Weight Bearing Restrictions No  Home Living  Family/patient expects to be discharged to: Private residence  Living Arrangements Non-relatives/Friends;Spouse/significant other  Available Help at Discharge Family;Personal care attendant;Available 24 hours/day  Type of Home House  Prior Function  Level of Independence Needs assistance  Communication  Communication No difficulties  Cognition  Behavior During Therapy Anxious  Overall Cognitive Status Difficult to assess  Upper Extremity Assessment  Upper Extremity Assessment Generalized weakness  Lower Extremity Assessment  Lower Extremity Assessment Generalized weakness  Cervical / Trunk Assessment  Cervical / Trunk Assessment Kyphotic  Transfers  Overall transfer level Needs assistance  Equipment used 1 person hand held assist  General transfer comment declined to stand up after initial attempt  Ambulation/Gait  General Gait Details declined  Balance  Overall balance assessment Needs assistance;History of Falls  Sitting-balance support Feet supported;Bilateral upper extremity supported  Sitting balance-Leahy Scale Fair  Standing balance-Leahy Scale Poor  General Comments  General comments (skin integrity, edema, etc.) Pt is up to side of bed, not reporting there is touble !  PT - End of Session  Equipment Utilized During Treatment Gait belt;Oxygen  Activity Tolerance Patient limited by fatigue;Treatment limited secondary to medical complications (Comment)  Patient left with call bell/phone within reach;with nursing/sitter in room;with family/visitor present  Nurse Communication Mobility status   PT G-Codes **NOT FOR INPATIENT CLASS**  Functional Assessment Tool Used AM-PAC 6 Clicks Basic Mobility  PT Evaluation  $PT Eval Moderate Complexity 1 Mod  PT Treatments  $Gait Training 8-22 mins  Written Expression  Dominant Hand Right    Mee Hives, PT MS Acute Rehab Dept. Number: [place] and [place]

## 2017-10-12 NOTE — Progress Notes (Signed)
Progress Note  Patient Name: Nicole [name] Date of Encounter: 10/12/2017  Primary Cardiologist: Dr. Lyman Bishop  Subjective   Breathing has improved, no palpitations or chest pain.  Still relatively weak, has not ambulated.  Inpatient Medications    Scheduled Meds: . allopurinol  100 mg Oral Daily  . aspirin EC  81 mg Oral Daily  . diltiazem  120 mg Oral Daily  . donepezil  10 mg Oral QHS  . enoxaparin (LOVENOX) injection  40 mg Subcutaneous Daily  . ferrous sulfate  325 mg Oral BID WC  . furosemide  40 mg Intravenous BID  . hydrALAZINE  75 mg Oral Q8H  . isosorbide mononitrate  30 mg Oral BID  . levothyroxine  25 mcg Oral QAC breakfast  . metoprolol succinate  25 mg Oral Daily  . sertraline  50 mg Oral Daily  . sodium chloride flush  3 mL Intravenous Q12H   Continuous Infusions: . sodium chloride     PRN Meds: sodium chloride, acetaminophen, dextromethorphan-guaiFENesin, hydrALAZINE, hydrOXYzine, ipratropium, levalbuterol, morphine injection, nitroGLYCERIN, sodium chloride flush, zolpidem   Vital Signs    Vitals:   10/11/17 2016 10/12/17 0014 10/12/17 0350 10/12/17 0804  BP: (!) 162/90 (!) 146/84 140/73 (!) 152/77  Pulse: 86 75 68   Resp: 18 16 13    Temp: 98.9 F (37.2 C) (!) 97.5 F (36.4 C) (!) 97.5 F (36.4 C) 98.3 F (36.8 C)  TempSrc: Oral Oral Oral Oral  SpO2: 99% 99% 100% 99%  Weight:   138 lb 10.7 oz (62.9 kg)     Intake/Output Summary (Last 24 hours) at 10/12/2017 0930 Last data filed at 10/12/2017 0806 Gross per 24 hour  Intake 720 ml  Output 2000 ml  Net -1280 ml   Filed Weights   10/11/17 0417 10/12/17 0350  Weight: 143 lb 4.8 oz (65 kg) 138 lb 10.7 oz (62.9 kg)    Telemetry    Coarse atrial fibrillation/flutter with controlled heart rate.  Personally reviewed.  Physical Exam   GEN:  Elderly woman.  No acute distress.   Neck: No JVD. Cardiac:  Irregularly irregular, 2/6 systolic murmur, no gallop.  Respiratory: Nonlabored.   Decreased breath sounds at the bases, no wheezing. GI: Soft, nontender, bowel sounds present. MS:  Mild lower leg edema; No deformity. Neuro:  Nonfocal. Psych: Alert and oriented x 3. Normal affect.  Labs    Chemistry Recent Labs  Lab 10/10/17 1841 10/11/17 0359 10/12/17 0505  NA 140 140 140  K 2.7* 3.7 3.1*  CL 95* 96* 94*  CO2 32 32 35*  GLUCOSE 160* 137* 78  BUN 14 15 12   CREATININE 1.36* 1.29* 1.23*  CALCIUM 7.8* 7.7* 7.6*  GFRNONAA 35* 37* 39*  GFRAA 40* 43* 45*  ANIONGAP 13 12 11      Hematology Recent Labs  Lab 10/09/17 2020  WBC 5.4  RBC 5.32*  HGB 14.6  HCT 42.1  MCV 79.1  MCH 27.4  MCHC 34.7  RDW 18.3*  PLT 155    Cardiac Enzymes Recent Labs  Lab 10/09/17 2308 10/10/17 0045 10/10/17 0344  TROPONINI 0.04* 0.03* 0.04*   No results for input(s): TROPIPOC in the last 168 hours.   BNP Recent Labs  Lab 10/09/17 2020  BNP 892.5*     Radiology    No results found.  Cardiac Studies   Echocardiogram 10/10/2017: Study Conclusions  - Left ventricle: The cavity size was normal. Wall thickness was   normal. Systolic function was normal.  The estimated ejection   fraction was in the range of 55% to 60%. - Mitral valve: There was mild regurgitation. - Left atrium: The atrium was moderately dilated. - Right ventricle: The cavity size was mildly dilated. Systolic   function was mildly to moderately reduced. - Right atrium: The atrium was severely dilated. - Tricuspid valve: There was moderate regurgitation. - Pulmonic valve: There was moderate regurgitation. - Pericardium, extracardiac: A trivial pericardial effusion was   identified. There was a left pleural effusion.  Patient Profile     84 y.o. female with chronic atrial fibrillation, history of GI bleeding not on anticoagulation chronically, chronic diastolic heart failure with prior history of cardiomyopathy, hypertension, hyperlipidemia, dementia, and asymptomatic ascending thoracic aortic  aneurysm.  She presents with acute on chronic diastolic heart failure.  Assessment & Plan    1.  Acute on chronic diastolic heart failure.  Follow-up echocardiogram shows LVEF 55-60% range.  She also has mild to moderate right ventricular dysfunction.  Moderate bilateral pleural effusions were present on initial chest x-ray.  She continues to diurese on IV Lasix with stable renal function.  2.  Chronic coarse atrial fibrillation/flutter.  Heart rate is controlled on oral Cardizem CD.  She is not anticoagulated with prior history of GI bleeding. CHADSVASC score is 9.  3.  Minor elevation in troponin I, flat and not consistent with ACS.  4.  Symptom medic 4.8 cm ascending thoracic aortic aneurysm.  Continue current heart rate control regimen which includes Cardizem CD and Toprol-XL.  Maintain hydralazine/Imdur combination.  Would continue with IV Lasix today as she undergoes inpatient PT assessment to determine additional management needs.  Not entirely clear that she needs a higher outpatient dose of Lasix, can likely resume 40 mg daily at discharge.  Signed, Rozann Lesches, MD  10/12/2017, 9:30 AM

## 2017-10-13 LAB — BASIC METABOLIC PANEL
ANION GAP: 11 (ref 5–15)
BUN: 14 mg/dL (ref 6–20)
CALCIUM: 7.7 mg/dL — AB (ref 8.9–10.3)
CHLORIDE: 94 mmol/L — AB (ref 101–111)
CO2: 35 mmol/L — ABNORMAL HIGH (ref 22–32)
CREATININE: 1.18 mg/dL — AB (ref 0.44–1.00)
GFR calc non Af Amer: 41 mL/min — ABNORMAL LOW (ref >60)
GFR, EST AFRICAN AMERICAN: 48 mL/min — AB (ref >60)
Glucose, Bld: 83 mg/dL (ref 65–99)
Potassium: 3.1 mmol/L — ABNORMAL LOW (ref 3.5–5.1)
SODIUM: 140 mmol/L (ref 135–145)

## 2017-10-13 LAB — GLUCOSE, CAPILLARY: GLUCOSE-CAPILLARY: 122 mg/dL — AB (ref 65–99)

## 2017-10-13 MED ORDER — POTASSIUM CHLORIDE 20 MEQ/15ML (10%) PO SOLN
40.0000 meq | ORAL | Status: AC
  Administered 2017-10-13 (×2): 40 meq via ORAL
  Filled 2017-10-13 (×2): qty 30

## 2017-10-13 NOTE — Progress Notes (Signed)
PROGRESS NOTE    Nicole [name]  JEH:[id number] DOB: 08-26-1930 DOA: 10/09/2017 PCP: [name], DO   Brief Narrative: Nicole [name] is a 84 y.o. female with a history of hypertension, hyperlipidemia, diet-controlled diabetes, stroke, GERD, hypothyroidism, gout, depression, anxiety, thoracic aneurysm, AAA, colon cancer, atrial fibrillation not on anticoagulants, PE, ICH, dementia. She presented with worsening dyspnea and leg edema found to have CHF exacerbation and atrial fibrillation with RVR, started on IV lasix and Cardizem drip. No controlled on Cardizem PO.   Assessment & Plan:   Principal Problem:   Acute on chronic diastolic CHF (congestive heart failure) (HCC) Active Problems:   Atrial fibrillation with RVR (HCC)   Permanent atrial fibrillation (HCC)   Major depression   Hypothyroidism   Hypertension   Gout, unspecified   Hyperkalemia   Dementia   CAD (coronary artery disease)   Elevated troponin   Pressure injury of skin   Acute on chronic systolic heart failure Patient with a history of systolic heart failure, however, last EF was normal. Acute illness likely secondary to tachycardia (afib vs flutter). Symptoms improved with Lasix. No weight from yesterday, however, caregiver stated she thought dry weight was around 153-154 lbs. Last recorded weight in January of 162 lbs and lowest weight of 134 lbs 10-05/2017. Transthoracic Echocardiogram significant for normal EF of left ventricle and mild-moderate reduced RV function with mild-moderate regurgitation of all heart valves. Weight 141 lbs, down total 2 lbs from 3/22 with 2.7 L UOP over the last 24 hours -Continue Lasix per cardiology -Daily weight and strict in/out -PT eval pending  Atrial fibrillation/flutter Not on anticoagulation secondary to history of ICH. Follows with Dr. Debara Pickett as an outpatient. Started on Cardizem per primary care physician, but does have a history of reduced EF (last EF normal).  Transitioned from Cardizem drip to oral. Rate controlled. Telemetry reviewed, and patient is in atrial flutter -Continue Cardizem -Cardiology recommendations  History of CAD No chest pain -Continue Aspirin.  Elevated troponin In setting of atrial flutter/heart failure. No chest pain. EKG not suggestive of ACS. Troponin with flat trend.  Gout Stable -continue allopurinol  Hypothyroidism -Continue Synthroid  Hyperkalemia Hypokalemia Hyperkalemia resolved with repeat BMP. Hypokalemia treated with supplemental potassium. Secondary to diuresis. -Continue potassium supplementation with IV diuresis    DVT prophylaxis: Lovenox Code Status:   Code Status: DNR Family Communication: None at bedside Disposition Plan: Discharge home vs SNF when heart failure and atrial flutter better controlled.   Consultants:   Cardiology  Procedures:   Transthoracic Echocardiogram (10/10/2017) Study Conclusions  - Left ventricle: The cavity size was normal. Wall thickness was   normal. Systolic function was normal. The estimated ejection   fraction was in the range of 55% to 60%. - Mitral valve: There was mild regurgitation. - Left atrium: The atrium was moderately dilated. - Right ventricle: The cavity size was mildly dilated. Systolic   function was mildly to moderately reduced. - Right atrium: The atrium was severely dilated. - Tricuspid valve: There was moderate regurgitation. - Pulmonic valve: There was moderate regurgitation. - Pericardium, extracardiac: A trivial pericardial effusion was   identified. There was a left pleural effusion.  Antimicrobials:  None    Subjective: No concerns today  Objective: Vitals:   10/12/17 2145 10/13/17 0023 10/13/17 0414 10/13/17 0734  BP: (!) 141/74 136/89 136/83 (!) 149/83  Pulse: 77 76 71 87  Resp:  (!) 21 16 15   Temp: 99.2 F (37.3 C) 99 F (37.2 C) 98.9  F (37.2 C) 97.7 F (36.5 C)  TempSrc: Oral Oral Oral Axillary  SpO2: 100%  100% 100% 100%  Weight:   64.3 kg (141 lb 12.1 oz)     Intake/Output Summary (Last 24 hours) at 10/13/2017 0951 Last data filed at 10/13/2017 0700 Gross per 24 hour  Intake 1080 ml  Output 2300 ml  Net -1220 ml   Filed Weights   10/11/17 0417 10/12/17 0350 10/13/17 0414  Weight: 65 kg (143 lb 4.8 oz) 62.9 kg (138 lb 10.7 oz) 64.3 kg (141 lb 12.1 oz)    Examination:  Physical Exam  Constitutional:  Non-toxic appearance. She does not appear ill. No distress.  Cardiovascular: Normal rate.  No murmur heard. Pulmonary/Chest: Effort normal and breath sounds normal. No accessory muscle usage. No tachypnea. No respiratory distress.  Abdominal: Soft. Normal appearance. She exhibits no distension. There is no tenderness.  Musculoskeletal: Normal range of motion.       Right lower leg: She exhibits edema. She exhibits no tenderness.       Left lower leg: She exhibits edema. She exhibits no tenderness.  Neurological: She is alert. She is disoriented (oriented to person only).  Skin: Skin is warm and dry.  Psychiatric: She is withdrawn. Cognition and memory are impaired.  Flat affect      Data Reviewed: I have personally reviewed following labs and imaging studies  CBC: Recent Labs  Lab 10/09/17 2020  WBC 5.4  NEUTROABS 4.4  HGB 14.6  HCT 42.1  MCV 79.1  PLT 619   Basic Metabolic Panel: Recent Labs  Lab 10/10/17 1701 10/10/17 1841 10/11/17 0359 10/12/17 0505 10/13/17 0517  NA 137 140 140 140 140  K 7.2* 2.7* 3.7 3.1* 3.1*  CL 96* 95* 96* 94* 94*  CO2 29 32 32 35* 35*  GLUCOSE 114* 160* 137* 78 83  BUN 15 14 15 12 14   CREATININE 1.29* 1.36* 1.29* 1.23* 1.18*  CALCIUM 7.6* 7.8* 7.7* 7.6* 7.7*   GFR: Estimated Creatinine Clearance: 35.8 mL/min (A) (by C-G formula based on SCr of 1.18 mg/dL (H)). Liver Function Tests: No results for input(s): AST, ALT, ALKPHOS, BILITOT, PROT, ALBUMIN in the last 168 hours. No results for input(s): LIPASE, AMYLASE in the last 168  hours. No results for input(s): AMMONIA in the last 168 hours. Coagulation Profile: No results for input(s): INR, PROTIME in the last 168 hours. Cardiac Enzymes: Recent Labs  Lab 10/09/17 2308 10/10/17 0045 10/10/17 0344  TROPONINI 0.04* 0.03* 0.04*   BNP (last 3 results) No results for input(s): PROBNP in the last 8760 hours. HbA1C: No results for input(s): HGBA1C in the last 72 hours. CBG: No results for input(s): GLUCAP in the last 168 hours. Lipid Profile: No results for input(s): CHOL, HDL, LDLCALC, TRIG, CHOLHDL, LDLDIRECT in the last 72 hours. Thyroid Function Tests: No results for input(s): TSH, T4TOTAL, FREET4, T3FREE, THYROIDAB in the last 72 hours. Anemia Panel: No results for input(s): VITAMINB12, FOLATE, FERRITIN, TIBC, IRON, RETICCTPCT in the last 72 hours. Sepsis Labs: No results for input(s): PROCALCITON, LATICACIDVEN in the last 168 hours.  No results found for this or any previous visit (from the past 240 hour(s)).       Radiology Studies: No results found.      Scheduled Meds: . allopurinol  100 mg Oral Daily  . aspirin EC  81 mg Oral Daily  . diltiazem  120 mg Oral Daily  . donepezil  10 mg Oral QHS  . enoxaparin (LOVENOX) injection  40 mg Subcutaneous Daily  . ferrous sulfate  325 mg Oral BID WC  . furosemide  40 mg Intravenous BID  . hydrALAZINE  75 mg Oral Q8H  . isosorbide mononitrate  30 mg Oral BID  . levothyroxine  25 mcg Oral QAC breakfast  . metoprolol succinate  25 mg Oral Daily  . potassium chloride  40 mEq Oral Q4H  . sertraline  50 mg Oral Daily  . sodium chloride flush  3 mL Intravenous Q12H   Continuous Infusions: . sodium chloride       LOS: 3 days     Cordelia Poche, MD [facility] 10/13/2017, 9:51 AM Pager: [phone]  If 7PM-7AM, please contact night-coverage [email or web address] Password [facility] 10/13/2017, 9:51 AM

## 2017-10-13 NOTE — Progress Notes (Signed)
Progress Note  Patient Name: Nicole [name] Date of Encounter: 10/13/2017  Primary Cardiologist: Dr. Lyman Bishop  Subjective   Ate breakfast this morning.  Feels sleepy.  No palpitations or chest pain.  Inpatient Medications    Scheduled Meds: . allopurinol  100 mg Oral Daily  . aspirin EC  81 mg Oral Daily  . diltiazem  120 mg Oral Daily  . donepezil  10 mg Oral QHS  . enoxaparin (LOVENOX) injection  40 mg Subcutaneous Daily  . ferrous sulfate  325 mg Oral BID WC  . furosemide  40 mg Intravenous BID  . hydrALAZINE  75 mg Oral Q8H  . isosorbide mononitrate  30 mg Oral BID  . levothyroxine  25 mcg Oral QAC breakfast  . metoprolol succinate  25 mg Oral Daily  . potassium chloride  40 mEq Oral Q4H  . sertraline  50 mg Oral Daily  . sodium chloride flush  3 mL Intravenous Q12H   Continuous Infusions: . sodium chloride     PRN Meds: sodium chloride, acetaminophen, dextromethorphan-guaiFENesin, hydrALAZINE, hydrOXYzine, ipratropium, levalbuterol, morphine injection, nitroGLYCERIN, sodium chloride flush, zolpidem   Vital Signs    Vitals:   10/12/17 2145 10/13/17 0023 10/13/17 0414 10/13/17 0734  BP: (!) 141/74 136/89 136/83 (!) 149/83  Pulse: 77 76 71 87  Resp:  (!) 21 16 15   Temp: 99.2 F (37.3 C) 99 F (37.2 C) 98.9 F (37.2 C) 97.7 F (36.5 C)  TempSrc: Oral Oral Oral Axillary  SpO2: 100% 100% 100% 100%  Weight:   141 lb 12.1 oz (64.3 kg)     Intake/Output Summary (Last 24 hours) at 10/13/2017 0926 Last data filed at 10/13/2017 0700 Gross per 24 hour  Intake 1080 ml  Output 2300 ml  Net -1220 ml   Filed Weights   10/11/17 0417 10/12/17 0350 10/13/17 0414  Weight: 143 lb 4.8 oz (65 kg) 138 lb 10.7 oz (62.9 kg) 141 lb 12.1 oz (64.3 kg)    Telemetry    Coarse atrial fibrillation/flutter with controlled heart rate.  Personally reviewed.  Physical Exam   GEN:  Elderly woman.  No acute distress.   Neck: No JVD. Cardiac:  Irregularly irregular, 2/6  murmur, no gallop.  Respiratory: Nonlabored.  Decreased breath sounds without wheezing. GI: Soft, nontender, bowel sounds present. MS:  Mild ankle edema; No deformity. Neuro:  Nonfocal. Psych: Alert and oriented x 3. Normal affect.  Labs    Chemistry Recent Labs  Lab 10/11/17 0359 10/12/17 0505 10/13/17 0517  NA 140 140 140  K 3.7 3.1* 3.1*  CL 96* 94* 94*  CO2 32 35* 35*  GLUCOSE 137* 78 83  BUN 15 12 14   CREATININE 1.29* 1.23* 1.18*  CALCIUM 7.7* 7.6* 7.7*  GFRNONAA 37* 39* 41*  GFRAA 43* 45* 48*  ANIONGAP 12 11 11      Hematology Recent Labs  Lab 10/09/17 2020  WBC 5.4  RBC 5.32*  HGB 14.6  HCT 42.1  MCV 79.1  MCH 27.4  MCHC 34.7  RDW 18.3*  PLT 155    Cardiac Enzymes Recent Labs  Lab 10/09/17 2308 10/10/17 0045 10/10/17 0344  TROPONINI 0.04* 0.03* 0.04*   No results for input(s): TROPIPOC in the last 168 hours.   BNP Recent Labs  Lab 10/09/17 2020  BNP 892.5*     Radiology    No results found.  Cardiac Studies   Echocardiogram 10/10/2017: Study Conclusions  - Left ventricle: The cavity size was normal. Wall thickness  was normal. Systolic function was normal. The estimated ejection fraction was in the range of 55% to 60%. - Mitral valve: There was mild regurgitation. - Left atrium: The atrium was moderately dilated. - Right ventricle: The cavity size was mildly dilated. Systolic function was mildly to moderately reduced. - Right atrium: The atrium was severely dilated. - Tricuspid valve: There was moderate regurgitation. - Pulmonic valve: There was moderate regurgitation. - Pericardium, extracardiac: A trivial pericardial effusion was identified. There was a left pleural effusion.  Patient Profile     84 y.o. female with chronic atrial fibrillation, history of GI bleeding not on anticoagulation chronically, chronic diastolic heart failure with prior history of cardiomyopathy, hypertension, hyperlipidemia, dementia, and  asymptomatic ascending thoracic aortic aneurysm.  She presents with acute on chronic diastolic heart failure.  Assessment & Plan    1.  Acute on chronic diastolic heart failure.  LVEF 55 to 60% with mild to moderate right ventricular dysfunction as well.  Net output more than intake of 1600 cc last 24 hours on IV Lasix.  Renal function relatively stable.  2.  Chronic coarse atrial fibrillation/flutter.  Continues on Cardizem CD and Toprol-XL for heart rate control.  CHADSVASC score is 9, however she is not anticoagulated with previous history of GI bleeding.  3.  Minor elevation in troponin I, flat pattern not consistent with ACS.  4.  Asymptomatic 4.8 cm ascending thoracic aortic aneurysm.  No change in current heart rate control regimen including Cardizem CD and Toprol-XL.  Blood pressure control reasonable on hydralazine/Imdur.  Continue IV Lasix with stable renal function and good diuresis.  Continues to work with PT.  Anticipate resuming previous home dose of Lasix at discharge.  Signed, Rozann Lesches, MD  10/13/2017, 9:26 AM

## 2017-10-13 NOTE — Plan of Care (Signed)
  Problem: Education: Goal: Knowledge of General Education information will improve Outcome: Progressing Note:  POC reviewed with pt.; unsure how much pt. understands.

## 2017-10-14 LAB — BASIC METABOLIC PANEL
Anion gap: 10 (ref 5–15)
BUN: 15 mg/dL (ref 6–20)
CHLORIDE: 93 mmol/L — AB (ref 101–111)
CO2: 36 mmol/L — ABNORMAL HIGH (ref 22–32)
CREATININE: 1.17 mg/dL — AB (ref 0.44–1.00)
Calcium: 7.7 mg/dL — ABNORMAL LOW (ref 8.9–10.3)
GFR calc Af Amer: 48 mL/min — ABNORMAL LOW (ref >60)
GFR calc non Af Amer: 42 mL/min — ABNORMAL LOW (ref >60)
Glucose, Bld: 58 mg/dL — ABNORMAL LOW (ref 65–99)
Potassium: 3.2 mmol/L — ABNORMAL LOW (ref 3.5–5.1)
SODIUM: 139 mmol/L (ref 135–145)

## 2017-10-14 MED ORDER — POTASSIUM CHLORIDE 20 MEQ/15ML (10%) PO SOLN
40.0000 meq | Freq: Two times a day (BID) | ORAL | Status: AC
  Administered 2017-10-14 (×2): 40 meq via ORAL
  Filled 2017-10-14 (×2): qty 30

## 2017-10-14 NOTE — Progress Notes (Signed)
Physical Therapy Treatment Patient Details Name: Nicole [name] MRN: [id number] DOB: 04-11-1931 Today's Date: 10/14/2017    History of Present Illness 84 yo female with onset of a-flutter and troponins are mild, PMHx:  chf, EF is 55-60, PMHx:  DM, CVA, cardiomyopathy,dementia, CHF, ascending thoracic aortic aneurysm is     PT Comments    PTA pt reports being able to ambulate in her home with assist of RW or furniture, and requires home aide for assist with iADLs. Pt currently is mod A for bed mobility and stand pivot transfers to and from [facility]. As such pt would not be able to safely mobilize in her home environment.Pt recommending SNF level rehab at d/c to increase strength and endurance to be able to ultimately discharge safely to her home. PT will continue to follow acutely until d/c.      Follow Up Recommendations  SNF     Equipment Recommendations  Other (comment)(TBD at next venue)    Recommendations for Other Services       Precautions / Restrictions Precautions Precautions: Fall Restrictions Weight Bearing Restrictions: No    Mobility  Bed Mobility Overal bed mobility: Needs Assistance Bed Mobility: Sit to Supine;Supine to Sit     Supine to sit: Mod assist Sit to supine: Mod assist   General bed mobility comments: Mod A for management of LE off bed and for trunk to upright and then for LE management back to bed  Transfers Overall transfer level: Needs assistance Equipment used: 1 person hand held assist;Rolling walker (2 wheeled) Transfers: Sit to/from [facility] Sit to Stand: Mod assist Stand pivot transfers: Mod assist       General transfer comment: modA for power up and stand pivot transfer to [facility], attempted to use RW for stand step transfer back to bed but unable to manage, modA required for SPT back to bed  Ambulation/Gait             General Gait Details: unable        Balance Overall balance assessment: Needs  assistance;History of Falls Sitting-balance support: Feet supported;Bilateral upper extremity supported Sitting balance-Leahy Scale: Fair     Standing balance support: Bilateral upper extremity supported Standing balance-Leahy Scale: Poor Standing balance comment: requires B UE support and assist to maintain standing                             Cognition Arousal/Alertness: Awake/alert Behavior During Therapy: Anxious;Flat affect Overall Cognitive Status: Difficult to assess                                           General Comments General comments (skin integrity, edema, etc.): Pt live in friend present through out session.       Pertinent Vitals/Pain Pain Assessment: Faces Faces Pain Scale: Hurts little more Pain Descriptors / Indicators: Grimacing;Moaning Pain Intervention(s): Limited activity within patient's tolerance;Monitored during session;Repositioned           PT Goals (current goals can now be found in the care plan section) Progress towards PT goals: Progressing toward goals    Frequency    Min 2X/week      PT Plan Discharge plan needs to be updated       AM-PAC PT "6 Clicks" Daily Activity  Outcome Measure  Difficulty turning over in bed (including  adjusting bedclothes, sheets and blankets)?: Unable Difficulty moving from lying on back to sitting on the side of the bed? : Unable Difficulty sitting down on and standing up from a chair with arms (e.g., wheelchair, bedside commode, etc,.)?: Unable Help needed moving to and from a bed to chair (including a wheelchair)?: A Lot Help needed walking in hospital room?: Total Help needed climbing 3-5 steps with a railing? : Total 6 Click Score: 7    End of Session Equipment Utilized During Treatment: Gait belt;Oxygen Activity Tolerance: Patient limited by fatigue;Treatment limited secondary to medical complications (Comment) Patient left: with call bell/phone within reach;with  nursing/sitter in room;with family/visitor present Nurse Communication: Mobility status PT Visit Diagnosis: Unsteadiness on feet (R26.81);Other abnormalities of gait and mobility (R26.89);Muscle weakness (generalized) (M62.81);Difficulty in walking, not elsewhere classified (R26.2)     Time: 1025-8527 PT Time Calculation (min) (ACUTE ONLY): 27 min  Charges:  $Therapeutic Activity: 23-37 mins                    G Codes:       Nicole [name] B. Migdalia Dk PT, DPT Acute Rehabilitation  [phone] Pager [phone]     [place] 10/14/2017, 5:07 PM

## 2017-10-14 NOTE — Progress Notes (Addendum)
Progress Note  Patient Name: Nicole [name] Date of Encounter: 10/14/2017  Primary Cardiologist: Pixie Casino, MD   Subjective   84 yo with hx of chronic AF, hx of jGI bleed ( not on anticoagulation  ) ,  Chronic diastolic CHF admitted with acute on chronic diastolic CHF  She has significant dementia.  The patient also has a 4.8 cm thoracic aneurysm but is not a candidate for surgical repair.  Troponin levels are minimally elevated and are flat. No chest pain and SOB is better.  Trying to sit up pt became very tired.    Inpatient Medications    Scheduled Meds: . allopurinol  100 mg Oral Daily  . aspirin EC  81 mg Oral Daily  . diltiazem  120 mg Oral Daily  . donepezil  10 mg Oral QHS  . enoxaparin (LOVENOX) injection  40 mg Subcutaneous Daily  . ferrous sulfate  325 mg Oral BID WC  . furosemide  40 mg Intravenous BID  . hydrALAZINE  75 mg Oral Q8H  . isosorbide mononitrate  30 mg Oral BID  . levothyroxine  25 mcg Oral QAC breakfast  . metoprolol succinate  25 mg Oral Daily  . sertraline  50 mg Oral Daily  . sodium chloride flush  3 mL Intravenous Q12H   Continuous Infusions: . sodium chloride     PRN Meds: sodium chloride, acetaminophen, dextromethorphan-guaiFENesin, hydrALAZINE, hydrOXYzine, ipratropium, levalbuterol, morphine injection, nitroGLYCERIN, sodium chloride flush, zolpidem   Vital Signs    Vitals:   10/13/17 1729 10/13/17 1934 10/13/17 2311 10/14/17 0415  BP: 124/78 121/68 115/64 (!) 144/68  Pulse: 78 73 76 83  Resp: (!) 21 18 (!) 23 (!) 21  Temp: 98.1 F (36.7 C) 98.2 F (36.8 C) 98.7 F (37.1 C) 98.6 F (37 C)  TempSrc: Axillary Oral Oral Oral  SpO2: 100% 99% 99% 100%  Weight:    138 lb 7.2 oz (62.8 kg)    Intake/Output Summary (Last 24 hours) at 10/14/2017 0836 Last data filed at 10/14/2017 0419 Gross per 24 hour  Intake 480 ml  Output 1150 ml  Net -670 ml   Filed Weights   10/12/17 0350 10/13/17 0414 10/14/17 0415  Weight: 138 lb  10.7 oz (62.9 kg) 141 lb 12.1 oz (64.3 kg) 138 lb 7.2 oz (62.8 kg)    Telemetry    A flutter rate controlled - Personally Reviewed  ECG    No new - Personally Reviewed  Physical Exam   GEN: No acute distress.   Neck: + JVD Cardiac: irreg irreg, no murmurs, rubs, or gallops.  Respiratory: Clear though diminished breath sounds to auscultation bilaterally. GI: Soft, nontender, non-distended  MS: No edema; No deformity. Neuro:  Nonfocal  Psych: Normal affect   Labs    Chemistry Recent Labs  Lab 10/12/17 0505 10/13/17 0517 10/14/17 0434  NA 140 140 139  K 3.1* 3.1* 3.2*  CL 94* 94* 93*  CO2 35* 35* 36*  GLUCOSE 78 83 58*  BUN 12 14 15   CREATININE 1.23* 1.18* 1.17*  CALCIUM 7.6* 7.7* 7.7*  GFRNONAA 39* 41* 42*  GFRAA 45* 48* 48*  ANIONGAP 11 11 10      Hematology Recent Labs  Lab 10/09/17 2020  WBC 5.4  RBC 5.32*  HGB 14.6  HCT 42.1  MCV 79.1  MCH 27.4  MCHC 34.7  RDW 18.3*  PLT 155    Cardiac Enzymes Recent Labs  Lab 10/09/17 2308 10/10/17 0045 10/10/17 0344  TROPONINI  0.04* 0.03* 0.04*   No results for input(s): TROPIPOC in the last 168 hours.   BNP Recent Labs  Lab 10/09/17 2020  BNP 892.5*     DDimer No results for input(s): DDIMER in the last 168 hours.   Radiology    No results found.  Cardiac Studies   Echocardiogram 10/10/2017: Study Conclusions  - Left ventricle: The cavity size was normal. Wall thickness was normal. Systolic function was normal. The estimated ejection fraction was in the range of 55% to 60%. - Mitral valve: There was mild regurgitation. - Left atrium: The atrium was moderately dilated. - Right ventricle: The cavity size was mildly dilated. Systolic function was mildly to moderately reduced. - Right atrium: The atrium was severely dilated. - Tricuspid valve: There was moderate regurgitation. - Pulmonic valve: There was moderate regurgitation. - Pericardium, extracardiac: A trivial pericardial  effusion was identified. There was a left pleural effusion.    Patient Profile     84 y.o. female with chronic atrial fibrillation, history of GI bleeding not on anticoagulation chronically, chronic diastolic heart failure with prior history of cardiomyopathy, hypertension, hyperlipidemia, dementia, and asymptomatic ascending thoracic aortic aneurysm. She presents with acute on chronic diastolic heart failure.  Assessment & Plan    Acute on chronic diastolic HF with EF 80-22%, with mild to moderate RV dysfunction  --neg 930 yesterday and total neg since admit negative 3130 --wt down from 143 lbs to 138 lbs.  --lasix 40 mg BID IV--continue   Hypokalemia with K+ 3.2 today will give 2 doses of 40 meq  --pt has been hyperkalemia  Chronic coarse atrial fib/ flutter on dilt CD and Torpol XL --HR is controlled.  Rare 45 at night.   --no anticoagulation though CHA2DS2VASc is 9 due to GI bleed.   Flat troponin, due to HF, not consistent with ACS     Ascending thoracic aortic aneurysm, 4.8 CM --asymptomatic.   Weakness per IM, continue PT   For questions or updates, please contact [place] Please consult [email or web address] for contact info under Cardiology/STEMI.      Signed, Cecilie Kicks, NP  10/14/2017, 8:36 AM    Attending Note:   The patient was seen and examined.  Agree with assessment and plan as noted above.  Changes made to the above note as needed.  Patient seen and independently examined with  Cecilie Kicks, NP .   We discussed all aspects of the encounter. I agree with the assessment and plan as stated above.  1.  Acute on chronic diastolic congestive heart failure: The patient is diuresed.  She is feeling quite a bit better.  Continue current dose of Lasix.  We will supplement her potassium dose.  2.  Chronic atrial fibrillation: She has a very high CHADS2VASC score.   Not on oral anticoagulation due to hx of bleeding.    I have spent a total of 40 minutes with  patient reviewing hospital  notes , telemetry, EKGs, labs and examining patient as well as establishing an assessment and plan that was discussed with the patient. > 50% of time was spent in direct patient care.    Thayer Headings, Brooke Bonito., MD, [facility] 10/14/2017, 9:32 AM [address],  [place] Pager [phone]

## 2017-10-14 NOTE — Progress Notes (Signed)
PROGRESS NOTE    Nicole HAVLICEK  ZOX:[id number] DOB: 06-Sep-1930 DOA: 10/09/2017 PCP: [name], DO   Brief Narrative: Nicole [name] is a 84 y.o. female with a history of hypertension, hyperlipidemia, diet-controlled diabetes, stroke, GERD, hypothyroidism, gout, depression, anxiety, thoracic aneurysm, AAA, colon cancer, atrial fibrillation not on anticoagulants, PE, ICH, dementia. She presented with worsening dyspnea and leg edema found to have CHF exacerbation and atrial fibrillation with RVR, started on IV lasix and Cardizem drip. No controlled on Cardizem PO.   Assessment & Plan:   Principal Problem:   Acute on chronic diastolic CHF (congestive heart failure) (HCC) Active Problems:   Atrial fibrillation with RVR (HCC)   Permanent atrial fibrillation (HCC)   Major depression   Hypothyroidism   Hypertension   Gout, unspecified   Hyperkalemia   Dementia   CAD (coronary artery disease)   Elevated troponin   Pressure injury of skin   Acute on chronic systolic heart failure Patient with a history of systolic heart failure, however, last EF was normal. Acute illness likely secondary to tachycardia (afib vs flutter). Symptoms improved with Lasix. No weight from yesterday, however, caregiver stated she thought dry weight was around 153-154 lbs. Last recorded weight in January of 162 lbs and lowest weight of 134 lbs 10-05/2017. Transthoracic Echocardiogram significant for normal EF of left ventricle and mild-moderate reduced RV function with mild-moderate regurgitation of all heart valves. Weight 141 lbs, down total 2 lbs from 3/22 with 2.7 L UOP over the last 24 hours -Continue Lasix per cardiology -Daily weight and strict in/out -PT eval pending for disposition  Atrial fibrillation/flutter Not on anticoagulation secondary to history of ICH. Follows with Dr. Debara Pickett as an outpatient. Started on Cardizem per primary care physician, but does have a history of reduced EF (last EF  normal). Transitioned from Cardizem drip to oral. Rate controlled. Telemetry reviewed, and patient is in atrial flutter -Continue Cardizem -Cardiology recommendations  History of CAD No chest pain -Continue Aspirin.  Elevated troponin In setting of atrial flutter/heart failure. No chest pain. EKG not suggestive of ACS. Troponin with flat trend.  Gout Stable -continue allopurinol  Hypothyroidism -Continue Synthroid  Hyperkalemia Hypokalemia Hyperkalemia resolved with repeat BMP. Hypokalemia treated with supplemental potassium. Secondary to diuresis. -Continue potassium supplementation with IV diuresis    DVT prophylaxis: Lovenox Code Status:   Code Status: DNR Family Communication: None at bedside Disposition Plan: Discharge home vs SNF when heart failure and atrial flutter better controlled.   Consultants:   Cardiology  Procedures:   Transthoracic Echocardiogram (10/10/2017) Study Conclusions  - Left ventricle: The cavity size was normal. Wall thickness was   normal. Systolic function was normal. The estimated ejection   fraction was in the range of 55% to 60%. - Mitral valve: There was mild regurgitation. - Left atrium: The atrium was moderately dilated. - Right ventricle: The cavity size was mildly dilated. Systolic   function was mildly to moderately reduced. - Right atrium: The atrium was severely dilated. - Tricuspid valve: There was moderate regurgitation. - Pulmonic valve: There was moderate regurgitation. - Pericardium, extracardiac: A trivial pericardial effusion was   identified. There was a left pleural effusion.  Antimicrobials:  None    Subjective: No concerns today  Objective: Vitals:   10/13/17 1934 10/13/17 2311 10/14/17 0415 10/14/17 0933  BP: 121/68 115/64 (!) 144/68 123/72  Pulse: 73 76 83   Resp: 18 (!) 23 (!) 21   Temp: 98.2 F (36.8 C) 98.7 F (37.1  C) 98.6 F (37 C)   TempSrc: Oral Oral Oral   SpO2: 99% 99% 100%   Weight:    62.8 kg (138 lb 7.2 oz)     Intake/Output Summary (Last 24 hours) at 10/14/2017 1109 Last data filed at 10/14/2017 0419 Gross per 24 hour  Intake 480 ml  Output 1150 ml  Net -670 ml   Filed Weights   10/12/17 0350 10/13/17 0414 10/14/17 0415  Weight: 62.9 kg (138 lb 10.7 oz) 64.3 kg (141 lb 12.1 oz) 62.8 kg (138 lb 7.2 oz)    Examination:  General exam: Appears calm and comfortable Respiratory system: Clear to auscultation. Respiratory effort normal. Cardiovascular system: S1 & S2 heard, irregular rhythm, normal rate. No murmurs, rubs, gallops or clicks. Gastrointestinal system: Abdomen is nondistended, soft and nontender. No organomegaly or masses felt. Normal bowel sounds heard. Central nervous system: Alert and oriented to person only. Extremities: 1-2+ pitting LE edema. No calf tenderness Skin: No cyanosis. No rashes Psychiatry: Judgement and insight appear impaired. Flat affect.   Data Reviewed: I have personally reviewed following labs and imaging studies  CBC: Recent Labs  Lab 10/09/17 2020  WBC 5.4  NEUTROABS 4.4  HGB 14.6  HCT 42.1  MCV 79.1  PLT 628   Basic Metabolic Panel: Recent Labs  Lab 10/10/17 1841 10/11/17 0359 10/12/17 0505 10/13/17 0517 10/14/17 0434  NA 140 140 140 140 139  K 2.7* 3.7 3.1* 3.1* 3.2*  CL 95* 96* 94* 94* 93*  CO2 32 32 35* 35* 36*  GLUCOSE 160* 137* 78 83 58*  BUN 14 15 12 14 15   CREATININE 1.36* 1.29* 1.23* 1.18* 1.17*  CALCIUM 7.8* 7.7* 7.6* 7.7* 7.7*   GFR: Estimated Creatinine Clearance: 35.5 mL/min (A) (by C-G formula based on SCr of 1.17 mg/dL (H)). Liver Function Tests: No results for input(s): AST, ALT, ALKPHOS, BILITOT, PROT, ALBUMIN in the last 168 hours. No results for input(s): LIPASE, AMYLASE in the last 168 hours. No results for input(s): AMMONIA in the last 168 hours. Coagulation Profile: No results for input(s): INR, PROTIME in the last 168 hours. Cardiac Enzymes: Recent Labs  Lab 10/09/17 2308  10/10/17 0045 10/10/17 0344  TROPONINI 0.04* 0.03* 0.04*   BNP (last 3 results) No results for input(s): PROBNP in the last 8760 hours. HbA1C: No results for input(s): HGBA1C in the last 72 hours. CBG: Recent Labs  Lab 10/13/17 1202  GLUCAP 122*   Lipid Profile: No results for input(s): CHOL, HDL, LDLCALC, TRIG, CHOLHDL, LDLDIRECT in the last 72 hours. Thyroid Function Tests: No results for input(s): TSH, T4TOTAL, FREET4, T3FREE, THYROIDAB in the last 72 hours. Anemia Panel: No results for input(s): VITAMINB12, FOLATE, FERRITIN, TIBC, IRON, RETICCTPCT in the last 72 hours. Sepsis Labs: No results for input(s): PROCALCITON, LATICACIDVEN in the last 168 hours.  No results found for this or any previous visit (from the past 240 hour(s)).       Radiology Studies: No results found.      Scheduled Meds: . allopurinol  100 mg Oral Daily  . aspirin EC  81 mg Oral Daily  . diltiazem  120 mg Oral Daily  . donepezil  10 mg Oral QHS  . enoxaparin (LOVENOX) injection  40 mg Subcutaneous Daily  . ferrous sulfate  325 mg Oral BID WC  . furosemide  40 mg Intravenous BID  . hydrALAZINE  75 mg Oral Q8H  . isosorbide mononitrate  30 mg Oral BID  . levothyroxine  25 mcg Oral  QAC breakfast  . metoprolol succinate  25 mg Oral Daily  . potassium chloride  40 mEq Oral BID  . sertraline  50 mg Oral Daily  . sodium chloride flush  3 mL Intravenous Q12H   Continuous Infusions: . sodium chloride       LOS: 4 days     Cordelia Poche, MD [facility] 10/14/2017, 11:09 AM Pager: [phone]  If 7PM-7AM, please contact night-coverage [email or web address] Password [email or web address] 10/14/2017, 11:09 AM

## 2017-10-15 ENCOUNTER — Inpatient Hospital Stay (HOSPITAL_COMMUNITY): Payer: Medicare HMO

## 2017-10-15 DIAGNOSIS — M79675 Pain in left toe(s): Secondary | ICD-10-CM

## 2017-10-15 DIAGNOSIS — M109 Gout, unspecified: Secondary | ICD-10-CM

## 2017-10-15 LAB — BASIC METABOLIC PANEL
Anion gap: 9 (ref 5–15)
BUN: 17 mg/dL (ref 6–20)
CHLORIDE: 97 mmol/L — AB (ref 101–111)
CO2: 36 mmol/L — ABNORMAL HIGH (ref 22–32)
CREATININE: 1.23 mg/dL — AB (ref 0.44–1.00)
Calcium: 8 mg/dL — ABNORMAL LOW (ref 8.9–10.3)
GFR calc non Af Amer: 39 mL/min — ABNORMAL LOW (ref >60)
GFR, EST AFRICAN AMERICAN: 45 mL/min — AB (ref >60)
Glucose, Bld: 86 mg/dL (ref 65–99)
Potassium: 3.5 mmol/L (ref 3.5–5.1)
SODIUM: 142 mmol/L (ref 135–145)

## 2017-10-15 LAB — URIC ACID: URIC ACID, SERUM: 5.2 mg/dL (ref 2.3–6.6)

## 2017-10-15 MED ORDER — FUROSEMIDE 40 MG PO TABS
40.0000 mg | ORAL_TABLET | Freq: Every day | ORAL | Status: DC
  Administered 2017-10-15 – 2017-10-16 (×2): 40 mg via ORAL
  Filled 2017-10-15 (×2): qty 1

## 2017-10-15 MED ORDER — DICLOFENAC SODIUM 1 % TD GEL
2.0000 g | Freq: Four times a day (QID) | TRANSDERMAL | Status: DC
  Administered 2017-10-15 – 2017-10-16 (×4): 2 g via TOPICAL
  Filled 2017-10-15: qty 100

## 2017-10-15 MED ORDER — POTASSIUM CHLORIDE CRYS ER 10 MEQ PO TBCR
10.0000 meq | EXTENDED_RELEASE_TABLET | Freq: Every day | ORAL | Status: DC
  Administered 2017-10-15 – 2017-10-16 (×2): 10 meq via ORAL
  Filled 2017-10-15 (×2): qty 1

## 2017-10-15 NOTE — Care Management Important Message (Signed)
Important Message  Patient Details  Name: Nicole [name] MRN: [id number] Date of Birth: 06-Nov-1930   Medicare Important Message Given:  Yes    Nealy Hickmon P [place] 10/15/2017, 1:21 PM

## 2017-10-15 NOTE — Clinical Social Work Note (Addendum)
Clinical Social Work Assessment  Patient Details  Name: Nicole [name] MRN: [id number] Date of Birth: July 23, 1931  Date of referral:  10/15/17               Reason for consult:  Facility Placement                Permission sought to share information with:  Facility Sport and exercise psychologist, Family Supports Permission granted to share information::  No(patient disoriented)  Name::     Nicole [name]  Agency::  SNFs  Relationship::  daughter  Contact Information:  [phone]  Housing/Transportation Living arrangements for the past 2 months:  Single Family Home Source of Information:  Adult Children Patient Interpreter Needed:  None Criminal Activity/Legal Involvement Pertinent to Current Situation/Hospitalization:  No - Comment as needed Significant Relationships:  Adult Children, Significant Other, Other Family Members Lives with:  Self Do you feel safe going back to the place where you live?  Yes Need for family participation in patient care:  Yes (Comment)  Care giving concerns: Patient from home. Was last in rehab in Nov/Dec 2018. PT recommending SNF.   Social Worker assessment / plan: CSW spoke to patient's daughter, Nicole [name], via phone. Daughter indicated patient was last in rehab at [facility] in November 2018. Daughter stated patient usually does well ambulating independently at home, but daughter is concerned about patient's medication changes. Daughter agreeable for patient to go to rehab again and would prefer [facility]. Patient will need [employer] authorization prior to admit to SNF.  Miquel Dunn has accepted referral and started [place] authorization. CSW awaiting determination.   Patient's goddaughter, Nicole [name], did call last week and CSW followed up today. Nicole [name] had questions about long term care planning. CSW to leave information about ALF and long term SNF, as well as Medicaid application information at bedside with patient.  CSW to support with discharge planning.  Employment  status:  Retired Astronomer) PT Recommendations:  [place] / Referral to community resources:  [place]  Patient/Family's Response to care: Daughter appreciative of care.  Patient/Family's Understanding of and Emotional Response to Diagnosis, Current Treatment, and Prognosis: Daughter with understanding of patient's condition and hopeful for SNF placement.  Emotional Assessment Appearance:  Appears stated age Attitude/Demeanor/Rapport:  Unable to Assess Affect (typically observed):  Unable to Assess Orientation:  Oriented to Self Alcohol / Substance use:  Not Applicable Psych involvement (Current and /or in the community):  No (Comment)  Discharge Needs  Concerns to be addressed:  Discharge Planning Concerns, Care Coordination Readmission within the last 30 days:  No Current discharge risk:  Physical Impairment, Cognitively Impaired Barriers to Discharge:  Continued Medical Work up   Nicole Emms, LCSW 10/15/2017, 10:00 AM

## 2017-10-15 NOTE — NC FL2 (Signed)
[place] LEVEL OF CARE SCREENING TOOL     IDENTIFICATION  Patient Name: Nicole [name] Birthdate: 1931/05/27 Sex: female Admission Date (Current Location): 10/09/2017  [facility] and [place] Number:  Herbalist and Address:  [facility] [place], [place] [address] [place], [place] [address]      Provider Number: [id number]  Attending Physician Name and Address:  Mariel Aloe, MD  Relative Name and Phone Number:  [name], daughter, [phone]    Current Level of Care: Hospital Recommended Level of Care: [place] Prior Approval Number:    Date Approved/Denied:   PASRR Number: [id number]  Discharge Plan: SNF    Current Diagnoses: Patient Active Problem List   Diagnosis Date Noted  . Pressure injury of skin 10/11/2017  . Hyperkalemia 10/10/2017  . Dementia 10/10/2017  . CAD (coronary artery disease) 10/10/2017  . Elevated troponin 10/10/2017  . Stroke ([place])   . Shortness of breath   . Proteinuria   . Perforation of bile duct   . Pain in joint, lower leg   . Other chronic allergic conjunctivitis   . Kidney stone   . Hypothyroidism   . Hypopotassemia   . Hypoglycemia, unspecified   . Hypertension   . Hypercholesteremia   . Hemorrhage of rectum and anus   . Gout, unspecified   . GERD (gastroesophageal reflux disease)   . Dizziness and giddiness   . Diabetes mellitus ([place])   . Depression   . CKD (chronic kidney disease)   . Chronic systolic heart failure ([place])   . Cholelithiasis   . Cervicalgia   . Atrial fibrillation ([place])   . Arthritis   . Anxiety   . AAA (abdominal aortic aneurysm) ([place])   . Moderate protein-calorie malnutrition ([place]) 07/11/2017  . Major depression 05/28/2017  . Adult failure to thrive 05/28/2017  . Acute encephalopathy 05/23/2017  . Acute lower UTI 05/23/2017  . Palliative care by specialist   . DNR (do not resuscitate)   . Bradyarrhythmia 01/11/2017  . Generalized  weakness 01/11/2017  . General weakness 01/11/2017  . PE (pulmonary thromboembolism) ([place]) 08/24/2016  . Accelerated hypertension with diastolic congestive heart failure, NYHA class 3 ([place]) 01/05/2016  . Permanent atrial fibrillation ([place]) 01/05/2016  . Rib pain on right side 10/14/2015  . Hypothyroidism due to acquired atrophy of thyroid 10/14/2015  . Type 2 diabetes mellitus with diabetic nephropathy, without long-term current use of insulin ([place]) 10/14/2015  . Essential hypertension 10/14/2015  . Acute on chronic systolic heart failure ([place]) 08/02/2015  . Acute on chronic diastolic CHF (congestive heart failure) ([place]) 08/02/2015  . Bradycardia 07/27/2015  . Chronic anemia 07/27/2015  . Chest pain   . Vascular dementia 06/13/2015  . Memory loss 06/13/2015  . Left hemiparesis ([place])   . Type 2 diabetes mellitus with peripheral neuropathy (HCC)   . ICH (intracerebral hemorrhage) ([place]) 04/04/2015  . Atrial fibrillation, chronic ([place])   . Hypertensive urgency   . SOB (shortness of breath) 04/03/2014  . Diabetic nephropathy ([place]) 09/07/2013  . Chronic constipation 03/25/2013  . Carpal tunnel syndrome 02/25/2013  . Hypokalemia 05/17/2012  . Atrial fibrillation with RVR ([place]) 05/16/2012  . Small bowel obstruction due to adhesions ([place]) 05/16/2012  . Normal coronary arteries, 2008 05/15/2012  . Type II or unspecified type diabetes mellitus with neurological manifestations, not stated as uncontrolled(250.60) 05/15/2012  . Acute respiratory distress 05/15/2012  . Abdominal pain, acute, right upper quadrant 05/12/2012  . Cholecystitis chronic,  acute, S/P lap cholecystectomy 05/14/12 05/12/2012  . Thoracic aortic aneurysm without rupture ([place])   . CHF, past NICM with an EF of 30%, most recent echo 2011 showed EF to be45- 50%   . Anemia 08/09/2011  . History of colon cancer, stage III 08/09/2011  . Colon cancer ([place]) 07/23/1997    Orientation RESPIRATION BLADDER Height & Weight      Self  Normal Incontinent, External catheter Weight: 129 lb 3 oz (58.6 kg) Height:     BEHAVIORAL SYMPTOMS/MOOD NEUROLOGICAL BOWEL NUTRITION STATUS      Continent Diet(please see DC summary)  AMBULATORY STATUS COMMUNICATION OF NEEDS Skin   Extensive Assist Verbally PU Stage and Appropriate Care(PU stage I coccyx, foam dressing)                       Personal Care Assistance Level of Assistance  Bathing, Feeding, Dressing Bathing Assistance: Limited assistance(moderate asssit) Feeding assistance: Independent Dressing Assistance: Limited assistance(moderate assist)     Functional Limitations Info  Sight, Hearing, Speech Sight Info: Adequate Hearing Info: Adequate Speech Info: Adequate    SPECIAL CARE FACTORS FREQUENCY  PT (By licensed PT)     PT Frequency: 5x/week              Contractures Contractures Info: Not present    Additional Factors Info  Code Status, Allergies, Psychotropic Code Status Info: DNR Allergies Info: Iohexol, Iodinated Diagnostic Agents, Z-pak Azithromycin Psychotropic Info: zoloft         Current Medications (10/15/2017):  This is the current hospital active medication list Current Facility-Administered Medications  Medication Dose Route Frequency Provider Last Rate Last Dose  . 0.9 %  sodium chloride infusion  250 mL Intravenous PRN Ivor Costa, MD      . acetaminophen (TYLENOL) tablet 650 mg  650 mg Oral Q4H PRN Ivor Costa, MD      . allopurinol (ZYLOPRIM) tablet 100 mg  100 mg Oral Daily Ivor Costa, MD   100 mg at 10/14/17 0934  . aspirin EC tablet 81 mg  81 mg Oral Daily Ivor Costa, MD   81 mg at 10/14/17 0934  . dextromethorphan-guaiFENesin (MUCINEX DM) 30-600 MG per 12 hr tablet 1 tablet  1 tablet Oral BID PRN Ivor Costa, MD      . diltiazem (CARDIZEM CD) 24 hr capsule 120 mg  120 mg Oral Daily Ivor Costa, MD   120 mg at 10/14/17 0934  . donepezil (ARICEPT) tablet 10 mg  10 mg Oral QHS Ivor Costa, MD   10 mg at 10/14/17 2104  .  enoxaparin (LOVENOX) injection 40 mg  40 mg Subcutaneous Daily Ivor Costa, MD   40 mg at 10/14/17 0933  . ferrous sulfate tablet 325 mg  325 mg Oral BID WC Ivor Costa, MD   325 mg at 10/14/17 1652  . furosemide (LASIX) injection 40 mg  40 mg Intravenous BID Ivor Costa, MD   40 mg at 10/14/17 1651  . hydrALAZINE (APRESOLINE) injection 5 mg  5 mg Intravenous Q2H PRN Ivor Costa, MD   5 mg at 10/10/17 0052  . hydrALAZINE (APRESOLINE) tablet 75 mg  75 mg Oral Q8H Ivor Costa, MD   75 mg at 10/15/17 2376  . hydrOXYzine (ATARAX/VISTARIL) tablet 10 mg  10 mg Oral TID PRN Ivor Costa, MD   10 mg at 10/10/17 0336  . ipratropium (ATROVENT) nebulizer solution 0.5 mg  0.5 mg Nebulization BID PRN Mariel Aloe, MD      .  isosorbide mononitrate (IMDUR) 24 hr tablet 30 mg  30 mg Oral BID Ivor Costa, MD   30 mg at 10/14/17 2104  . levalbuterol (XOPENEX) nebulizer solution 0.63 mg  0.63 mg Nebulization Q6H PRN Mariel Aloe, MD      . levothyroxine (SYNTHROID, LEVOTHROID) tablet 25 mcg  25 mcg Oral QAC breakfast Ivor Costa, MD   25 mcg at 10/15/17 4098  . metoprolol succinate (TOPROL-XL) 24 hr tablet 25 mg  25 mg Oral Daily Ivor Costa, MD   25 mg at 10/14/17 0933  . morphine 4 MG/ML injection 1 mg  1 mg Intravenous Q4H PRN Ivor Costa, MD      . nitroGLYCERIN (NITROSTAT) SL tablet 0.4 mg  0.4 mg Sublingual Q5 min PRN Ivor Costa, MD      . sertraline (ZOLOFT) tablet 50 mg  50 mg Oral Daily Ivor Costa, MD   50 mg at 10/14/17 0934  . sodium chloride flush (NS) 0.9 % injection 3 mL  3 mL Intravenous Q12H Ivor Costa, MD   3 mL at 10/14/17 0939  . sodium chloride flush (NS) 0.9 % injection 3 mL  3 mL Intravenous PRN Ivor Costa, MD      . zolpidem (AMBIEN) tablet 5 mg  5 mg Oral QHS PRN Ivor Costa, MD         Discharge Medications: Please see discharge summary for a list of discharge medications.  Relevant Imaging Results:  Relevant Lab Results:   Additional Information SSN: [id number]  Estanislado Emms,  LCSW

## 2017-10-15 NOTE — Care Management Note (Signed)
Case Management Note  Patient Details  Name: Nicole [name] MRN: [id number] Date of Birth: Mar 02, 1931  Subjective/Objective:   Pt presented for worsening dyspnea and leg edema- + for CHF and AFib RVR. Cardizem gtt transitioned to po and continues on IV Lasix BID. Pt is a DNR- PTA from home. PT/OT recommendations for SNF- CSW following and did discuss with family. Plan at this time will be SNF.                 Action/Plan: CM did discuss the case in [place]- if patient not willing to go to SNF- pt has been approved for [facility]) [place] for this week if needed. CM will continue to monitor.   Expected Discharge Date:                 Expected Discharge Plan:  Skilled Nursing Facility  In-House Referral:  Clinical Social Work  Discharge planning Services  CM Consult  Post Acute Care Choice:  NA Choice offered to:  NA  DME Arranged:  N/A DME Agency:  NA  HH Arranged:  NA HH Agency:  NA  Status of Service:  Completed, signed off  If discussed at [place] of Stay Meetings, dates discussed:    Additional Comments:  Bethena Roys, RN 10/15/2017, 12:34 PM

## 2017-10-15 NOTE — Progress Notes (Signed)
CSW met with patient and patient's significant other, [name], at bedside. Patient alert and sitting at edge of bed. CSW discussed plan for rehab at [facility] and patient is agreeable. CSW left resources on long term care planning at bedside for patient's goddaughter, [name], as requested. CSW continues to await [employer] authorization for [employer]   Estanislado Emms, [place]

## 2017-10-15 NOTE — Progress Notes (Addendum)
PROGRESS NOTE    Nicole [name]  WYO:[id number] DOB: 1931-05-28 DOA: 10/09/2017 PCP: [name], DO   Brief Narrative: Nicole [name] is a 84 y.o. female with a history of hypertension, hyperlipidemia, diet-controlled diabetes, stroke, GERD, hypothyroidism, gout, depression, anxiety, thoracic aneurysm, AAA, colon cancer, atrial fibrillation not on anticoagulants, PE, ICH, dementia. She presented with worsening dyspnea and leg edema found to have CHF exacerbation and atrial fibrillation with RVR, started on IV lasix and Cardizem drip. No controlled on Cardizem PO.   Assessment & Plan:   Principal Problem:   Acute on chronic diastolic CHF (congestive heart failure) (HCC) Active Problems:   Atrial fibrillation with RVR (HCC)   Permanent atrial fibrillation (HCC)   Major depression   Hypothyroidism   Hypertension   Gout, unspecified   Hyperkalemia   Dementia   CAD (coronary artery disease)   Elevated troponin   Pressure injury of skin   Acute on chronic systolic heart failure Patient with a history of systolic heart failure, however, last EF was normal. Acute illness likely secondary to tachycardia (afib vs flutter). Symptoms improved with Lasix. No weight from yesterday, however, caregiver stated she thought dry weight was around 153-154 lbs. Last recorded weight in January of 162 lbs and lowest weight of 134 lbs 10-05/2017. Transthoracic Echocardiogram significant for normal EF of left ventricle and mild-moderate reduced RV function with mild-moderate regurgitation of all heart valves. Weight 129 lbs, down total 14 lbs from 3/22 with 2.1 L UOP over the last 24 hours. Creatinine trending up. -Continue Lasix per cardiology -Daily weight and strict in/out -PT: SNF  Atrial fibrillation/flutter Not on anticoagulation secondary to history of ICH. Follows with Dr. Debara Pickett as an outpatient. Started on Cardizem per primary care physician, but does have a history of reduced EF (last EF  normal). Transitioned from Cardizem drip to oral. Rate controlled. Telemetry reviewed, and patient is in atrial flutter -Continue Cardizem -Cardiology recommendations  History of CAD No chest pain -Continue Aspirin.  Elevated troponin In setting of atrial flutter/heart failure. No chest pain. EKG not suggestive of ACS. Troponin with flat trend.  Gout Possible flare? -continue allopurinol  Hypothyroidism -Continue Synthroid  Hyperkalemia Hypokalemia Hyperkalemia resolved with repeat BMP. Hypokalemia treated with supplemental potassium. Secondary to diuresis. Improved. -Continue potassium supplementation with IV diuresis  Left toe pain Not obviously a gout flare but significant pain on palpation of first phalange of her great toe.  -Uric acid, if high, will start colchicine -Votaren  -X-ray  Pressure ulcer Stage 1 coccyx present on admission.   DVT prophylaxis: Lovenox Code Status:   Code Status: DNR Family Communication: None at bedside Disposition Plan: Discharge SNF likely tomorrow after transition to oral lasix and evaluation of toe pain   Consultants:   Cardiology  Procedures:   Transthoracic Echocardiogram (10/10/2017) Study Conclusions  - Left ventricle: The cavity size was normal. Wall thickness was   normal. Systolic function was normal. The estimated ejection   fraction was in the range of 55% to 60%. - Mitral valve: There was mild regurgitation. - Left atrium: The atrium was moderately dilated. - Right ventricle: The cavity size was mildly dilated. Systolic   function was mildly to moderately reduced. - Right atrium: The atrium was severely dilated. - Tricuspid valve: There was moderate regurgitation. - Pulmonic valve: There was moderate regurgitation. - Pericardium, extracardiac: A trivial pericardial effusion was   identified. There was a left pleural effusion.  Antimicrobials:  None    Subjective: Left toe  pain that started within the  last 24 hours. Pain is present at rest but worse when touched. No associated symptoms. Pain not improved since onset.  Objective: Vitals:   10/14/17 2019 10/15/17 0455 10/15/17 0500 10/15/17 0957  BP: 132/75 140/75  126/70  Pulse:  71    Resp: 20 (!) 24    Temp: 97.9 F (36.6 C) 99.3 F (37.4 C)    TempSrc: Oral Oral    SpO2: 99% 100%    Weight:   58.6 kg (129 lb 3 oz)     Intake/Output Summary (Last 24 hours) at 10/15/2017 1031 Last data filed at 10/14/2017 1700 Gross per 24 hour  Intake 360 ml  Output 1500 ml  Net -1140 ml   Filed Weights   10/13/17 0414 10/14/17 0415 10/15/17 0500  Weight: 64.3 kg (141 lb 12.1 oz) 62.8 kg (138 lb 7.2 oz) 58.6 kg (129 lb 3 oz)    Examination:  General exam: Appears calm and comfortable Respiratory: Clear to auscultation bilaterally. Unlabored work of breathing. No wheezing or rales. Cardiovascular system: Regular rate and irregular rhythm. Normal S1 and S2. No heart murmurs present. No extra heart sounds Gastrointestinal system: Soft, non-tender, non-distended, no guarding, no rebound, no masses felt. Normal bowel sounds Central nervous system: Alert and oriented to person and place. Extremities: trace LE edema. No calf tenderness. Left great toe unremarkable on visual inspection; no swelling or erythema; tenderness along the first phalange of the toe with no significant MTP joint tenderness. Old wound seen on plantar surface of great toe that does not appear infected. Skin: No cyanosis. No rashes Psychiatry: Judgement and insight appear impaired. Flat affect.   Data Reviewed: I have personally reviewed following labs and imaging studies  CBC: Recent Labs  Lab 10/09/17 2020  WBC 5.4  NEUTROABS 4.4  HGB 14.6  HCT 42.1  MCV 79.1  PLT 160   Basic Metabolic Panel: Recent Labs  Lab 10/11/17 0359 10/12/17 0505 10/13/17 0517 10/14/17 0434 10/15/17 0801  NA 140 140 140 139 142  K 3.7 3.1* 3.1* 3.2* 3.5  CL 96* 94* 94* 93* 97*    CO2 32 35* 35* 36* 36*  GLUCOSE 137* 78 83 58* 86  BUN 15 12 14 15 17   CREATININE 1.29* 1.23* 1.18* 1.17* 1.23*  CALCIUM 7.7* 7.6* 7.7* 7.7* 8.0*   GFR: Estimated Creatinine Clearance: 31.5 mL/min (A) (by C-G formula based on SCr of 1.23 mg/dL (H)). Liver Function Tests: No results for input(s): AST, ALT, ALKPHOS, BILITOT, PROT, ALBUMIN in the last 168 hours. No results for input(s): LIPASE, AMYLASE in the last 168 hours. No results for input(s): AMMONIA in the last 168 hours. Coagulation Profile: No results for input(s): INR, PROTIME in the last 168 hours. Cardiac Enzymes: Recent Labs  Lab 10/09/17 2308 10/10/17 0045 10/10/17 0344  TROPONINI 0.04* 0.03* 0.04*   BNP (last 3 results) No results for input(s): PROBNP in the last 8760 hours. HbA1C: No results for input(s): HGBA1C in the last 72 hours. CBG: Recent Labs  Lab 10/13/17 1202  GLUCAP 122*   Lipid Profile: No results for input(s): CHOL, HDL, LDLCALC, TRIG, CHOLHDL, LDLDIRECT in the last 72 hours. Thyroid Function Tests: No results for input(s): TSH, T4TOTAL, FREET4, T3FREE, THYROIDAB in the last 72 hours. Anemia Panel: No results for input(s): VITAMINB12, FOLATE, FERRITIN, TIBC, IRON, RETICCTPCT in the last 72 hours. Sepsis Labs: No results for input(s): PROCALCITON, LATICACIDVEN in the last 168 hours.  No results found for this or any  previous visit (from the past 240 hour(s)).       Radiology Studies: No results found.      Scheduled Meds: . allopurinol  100 mg Oral Daily  . aspirin EC  81 mg Oral Daily  . diltiazem  120 mg Oral Daily  . donepezil  10 mg Oral QHS  . enoxaparin (LOVENOX) injection  40 mg Subcutaneous Daily  . ferrous sulfate  325 mg Oral BID WC  . furosemide  40 mg Intravenous BID  . hydrALAZINE  75 mg Oral Q8H  . isosorbide mononitrate  30 mg Oral BID  . levothyroxine  25 mcg Oral QAC breakfast  . metoprolol succinate  25 mg Oral Daily  . sertraline  50 mg Oral Daily  .  sodium chloride flush  3 mL Intravenous Q12H   Continuous Infusions: . sodium chloride       LOS: 5 days     Cordelia Poche, MD [facility] 10/15/2017, 10:31 AM Pager: [phone]  If 7PM-7AM, please contact night-coverage [email or web address] Password [facility] 10/15/2017, 10:31 AM

## 2017-10-15 NOTE — Progress Notes (Addendum)
Progress Note  Patient Name: Nicole [name] Date of Encounter: 10/15/2017  Primary Cardiologist: Pixie Casino, MD   Subjective   She thinks her SOB is better. She is significantly debilitated, barley able to sit up in bed without help.   Inpatient Medications    Scheduled Meds: . allopurinol  100 mg Oral Daily  . aspirin EC  81 mg Oral Daily  . diltiazem  120 mg Oral Daily  . donepezil  10 mg Oral QHS  . enoxaparin (LOVENOX) injection  40 mg Subcutaneous Daily  . ferrous sulfate  325 mg Oral BID WC  . furosemide  40 mg Intravenous BID  . hydrALAZINE  75 mg Oral Q8H  . isosorbide mononitrate  30 mg Oral BID  . levothyroxine  25 mcg Oral QAC breakfast  . metoprolol succinate  25 mg Oral Daily  . sertraline  50 mg Oral Daily  . sodium chloride flush  3 mL Intravenous Q12H   Continuous Infusions: . sodium chloride     PRN Meds: sodium chloride, acetaminophen, dextromethorphan-guaiFENesin, hydrALAZINE, hydrOXYzine, ipratropium, levalbuterol, morphine injection, nitroGLYCERIN, sodium chloride flush, zolpidem   Vital Signs    Vitals:   10/14/17 1941 10/14/17 2019 10/15/17 0455 10/15/17 0500  BP: 124/85 132/75 140/75   Pulse: 98  71   Resp: 18 20 (!) 24   Temp: 98.1 F (36.7 C) 97.9 F (36.6 C) 99.3 F (37.4 C)   TempSrc: Oral Oral Oral   SpO2: 100% 99% 100%   Weight:    129 lb 3 oz (58.6 kg)    Intake/Output Summary (Last 24 hours) at 10/15/2017 0910 Last data filed at 10/14/2017 1700 Gross per 24 hour  Intake 600 ml  Output 2100 ml  Net -1500 ml   Filed Weights   10/13/17 0414 10/14/17 0415 10/15/17 0500  Weight: 141 lb 12.1 oz (64.3 kg) 138 lb 7.2 oz (62.8 kg) 129 lb 3 oz (58.6 kg)    Telemetry    A flutter variable VR - Personally Reviewed  ECG    No new - Personally Reviewed  Physical Exam   GEN: No acute distress.   Neck: no JVD noted Cardiac: irreg irreg, 2/6 systolic murmur rubs, or gallops.  Respiratory: Clear though diminished breath  sounds to auscultation bilaterally. GI: Soft, nontender, non-distended, some pitting edema noted in her hips MS: No edema; No deformity. Neuro:  Nonfocal  Psych: Normal affect   Labs    Chemistry Recent Labs  Lab 10/13/17 0517 10/14/17 0434 10/15/17 0801  NA 140 139 142  K 3.1* 3.2* 3.5  CL 94* 93* 97*  CO2 35* 36* 36*  GLUCOSE 83 58* 86  BUN 14 15 17   CREATININE 1.18* 1.17* 1.23*  CALCIUM 7.7* 7.7* 8.0*  GFRNONAA 41* 42* 39*  GFRAA 48* 48* 45*  ANIONGAP 11 10 9      Hematology Recent Labs  Lab 10/09/17 2020  WBC 5.4  RBC 5.32*  HGB 14.6  HCT 42.1  MCV 79.1  MCH 27.4  MCHC 34.7  RDW 18.3*  PLT 155    Cardiac Enzymes Recent Labs  Lab 10/09/17 2308 10/10/17 0045 10/10/17 0344  TROPONINI 0.04* 0.03* 0.04*   No results for input(s): TROPIPOC in the last 168 hours.   BNP Recent Labs  Lab 10/09/17 2020  BNP 892.5*     Radiology    PCXR-10/09/17- PORTABLE CHEST 1 VIEW  COMPARISON:  Most recent radiograph 06/29/2017. Most recent CT 01/11/2017  FINDINGS: Marked cardiomegaly, unchanged. Moderate  bilateral pleural effusions, similar to prior exam. There is central pulmonary edema, with progression. No pneumothorax or new focal airspace disease. Multiple skin folds project over the left chest.  IMPRESSION: Progressive pulmonary edema from prior exam. Chronic cardiomegaly and moderate bilateral pleural effusions.   Cardiac Studies   Echocardiogram 10/10/2017: Study Conclusions  - Left ventricle: The cavity size was normal. Wall thickness was normal. Systolic function was normal. The estimated ejection fraction was in the range of 55% to 60%. - Mitral valve: There was mild regurgitation. - Left atrium: The atrium was moderately dilated. - Right ventricle: The cavity size was mildly dilated. Systolic function was mildly to moderately reduced. - Right atrium: The atrium was severely dilated. - Tricuspid valve: There was moderate  regurgitation. - Pulmonic valve: There was moderate regurgitation. - Pericardium, extracardiac: A trivial pericardial effusion was identified. There was a left pleural effusion.    Patient Profile     84 y.o. female with chronic atrial fibrillation, history of GI bleeding (not on anticoagulation), chronic diastolic heart failure with prior history of cardiomyopathy, hypertension, hyperlipidemia, dementia, and asymptomatic ascending thoracic aortic aneurysm. She was admitted 10/09/17 with acute on chronic diastolic heart failure.  Assessment & Plan    Acute on chronic diastolic HF with EF 58-52%, with mild to moderate RV dysfunction  --neg 4630 total --wt down from 143 lbs to 129 (?) lbs, 138 lbs yesterday  --lasix 40 mg BID IV- consider transition to po diuretics  Hypokalemia with K+ 3.5 today will give 2 doses of 40 meq  --pt has been hyperkalemia  Chronic coarse atrial fib/ flutter on dilt CD and Torpol XL --HR is controlled.  Rare 45 at night.   --no anticoagulation though CHA2DS2VASc is 9 due to GI bleed.   Flat troponin, due to HF, not consistent with ACS     Ascending thoracic aortic aneurysm, 4.8 CM --asymptomatic.   Weakness per IM, continue PT   Plan: MD to see, consider changing over to PO Lasix. Needs SNF at [place].   For questions or updates, please contact [place] Please consult [email or web address] for contact info under Cardiology/STEMI.      Signed, Kerin Ransom, PA-C  10/15/2017, 9:10 AM     Attending Note:   The patient was seen and examined.  Agree with assessment and plan as noted above.  Changes made to the above note as needed.  Patient seen and independently examined with Kerin Ransom, PA .   We discussed all aspects of the encounter. I agree with the assessment and plan as stated above.  1.  Acute on chronic diastolic congestive heart failure: She is done very well.  She is breathing fairly normally.  She continues to diurese.  We will transition to  oral diuretics today. She needs to avoid salty foods   2.  Atrial fib:   HR is well controlled.  Not on anticoagulation due to hx of GI bleeding   She is very close to being ready for Dc . Probably tomorrow   She will need to go to SNF    I have spent a total of 40 minutes with patient reviewing hospital  notes , telemetry, EKGs, labs and examining patient as well as establishing an assessment and plan that was discussed with the patient. > 50% of time was spent in direct patient care.    Thayer Headings, Brooke Bonito., MD, [facility] 10/15/2017, 12:24 PM [address],  [place] Pager [phone]

## 2017-10-16 DIAGNOSIS — I10 Essential (primary) hypertension: Secondary | ICD-10-CM | POA: Diagnosis not present

## 2017-10-16 DIAGNOSIS — I129 Hypertensive chronic kidney disease with stage 1 through stage 4 chronic kidney disease, or unspecified chronic kidney disease: Secondary | ICD-10-CM | POA: Diagnosis not present

## 2017-10-16 DIAGNOSIS — I5033 Acute on chronic diastolic (congestive) heart failure: Secondary | ICD-10-CM | POA: Diagnosis not present

## 2017-10-16 DIAGNOSIS — F0390 Unspecified dementia without behavioral disturbance: Secondary | ICD-10-CM | POA: Diagnosis not present

## 2017-10-16 DIAGNOSIS — I4892 Unspecified atrial flutter: Secondary | ICD-10-CM | POA: Diagnosis not present

## 2017-10-16 DIAGNOSIS — E875 Hyperkalemia: Secondary | ICD-10-CM | POA: Diagnosis not present

## 2017-10-16 DIAGNOSIS — I480 Paroxysmal atrial fibrillation: Secondary | ICD-10-CM | POA: Diagnosis not present

## 2017-10-16 DIAGNOSIS — R69 Illness, unspecified: Secondary | ICD-10-CM | POA: Diagnosis not present

## 2017-10-16 DIAGNOSIS — R0902 Hypoxemia: Secondary | ICD-10-CM | POA: Diagnosis not present

## 2017-10-16 DIAGNOSIS — J9610 Chronic respiratory failure, unspecified whether with hypoxia or hypercapnia: Secondary | ICD-10-CM | POA: Diagnosis not present

## 2017-10-16 DIAGNOSIS — D509 Iron deficiency anemia, unspecified: Secondary | ICD-10-CM | POA: Diagnosis not present

## 2017-10-16 DIAGNOSIS — I2510 Atherosclerotic heart disease of native coronary artery without angina pectoris: Secondary | ICD-10-CM | POA: Diagnosis not present

## 2017-10-16 DIAGNOSIS — E039 Hypothyroidism, unspecified: Secondary | ICD-10-CM

## 2017-10-16 DIAGNOSIS — J189 Pneumonia, unspecified organism: Secondary | ICD-10-CM | POA: Diagnosis not present

## 2017-10-16 DIAGNOSIS — I509 Heart failure, unspecified: Secondary | ICD-10-CM | POA: Diagnosis not present

## 2017-10-16 DIAGNOSIS — M6281 Muscle weakness (generalized): Secondary | ICD-10-CM | POA: Diagnosis not present

## 2017-10-16 DIAGNOSIS — J9602 Acute respiratory failure with hypercapnia: Secondary | ICD-10-CM | POA: Diagnosis not present

## 2017-10-16 DIAGNOSIS — J80 Acute respiratory distress syndrome: Secondary | ICD-10-CM | POA: Diagnosis not present

## 2017-10-16 DIAGNOSIS — R0602 Shortness of breath: Secondary | ICD-10-CM | POA: Diagnosis not present

## 2017-10-16 DIAGNOSIS — I5032 Chronic diastolic (congestive) heart failure: Secondary | ICD-10-CM | POA: Diagnosis not present

## 2017-10-16 DIAGNOSIS — I5031 Acute diastolic (congestive) heart failure: Secondary | ICD-10-CM | POA: Diagnosis not present

## 2017-10-16 DIAGNOSIS — I4891 Unspecified atrial fibrillation: Secondary | ICD-10-CM | POA: Diagnosis not present

## 2017-10-16 DIAGNOSIS — I5040 Unspecified combined systolic (congestive) and diastolic (congestive) heart failure: Secondary | ICD-10-CM | POA: Diagnosis not present

## 2017-10-16 DIAGNOSIS — M109 Gout, unspecified: Secondary | ICD-10-CM | POA: Diagnosis not present

## 2017-10-16 DIAGNOSIS — I712 Thoracic aortic aneurysm, without rupture: Secondary | ICD-10-CM | POA: Diagnosis not present

## 2017-10-16 DIAGNOSIS — M79675 Pain in left toe(s): Secondary | ICD-10-CM | POA: Diagnosis not present

## 2017-10-16 DIAGNOSIS — I482 Chronic atrial fibrillation: Secondary | ICD-10-CM | POA: Diagnosis not present

## 2017-10-16 MED ORDER — DILTIAZEM HCL ER COATED BEADS 180 MG PO CP24: 180.0000 mg | ORAL_CAPSULE | Freq: Every day | ORAL | 3 refills | Status: DC

## 2017-10-16 MED ORDER — DONEPEZIL HCL 10 MG PO TABS: 10.0000 mg | ORAL_TABLET | Freq: Every day | ORAL | 0 refills | Status: AC

## 2017-10-16 MED ORDER — ASPIRIN 81 MG PO TBEC: 81.0000 mg | DELAYED_RELEASE_TABLET | Freq: Every day | ORAL | 6 refills | Status: DC

## 2017-10-16 MED ORDER — DILTIAZEM HCL ER COATED BEADS 180 MG PO CP24: 180.0000 mg | ORAL_CAPSULE | Freq: Every day | ORAL | Status: DC

## 2017-10-16 NOTE — Progress Notes (Addendum)
11:53 am CSW discussed disposition planning with patient's daughter, Peter Congo, and goddaughter, [name] via phone. Patient's family are agreeable for patient to go to rehab at [facility]. [facility] will accept LOG while patient's [employer] authorization is pending. Clinical supervisor approved 5 day LOG. MD aware. CSW to support with discharge to [facility] today.  10:25 am CSW continues to await [employer] authorization for [facility] CSW seeking LOG bed, as patient may be medically ready today. CSW consulting with clinical supervisor for approval of LOG. Facilities that are in network with [facility], [place], [place], [facility], [place]) do not take LOGs pending authorization except for [facility] in [place]. CSW to follow and support with discharge planning.  Estanislado Emms, [place]

## 2017-10-16 NOTE — Discharge Summary (Addendum)
Physician Discharge Summary  NATELIE OSTROSKY ZOX:[id number] DOB: 03-13-1931 DOA: 10/09/2017  PCP: [name], DO  Admit date: 10/09/2017 Discharge date: 10/16/2017  Admitted From: Home  Disposition:  SNF   Recommendations for Outpatient Follow-up:  1. Please obtain BMP in one week to follow renal function and K 2. Please obtain CXR in 3-4 weeks to follow pleural effusion 3. Monitor the left great toe for gouty flare   Home Health: N/A  Equipment/Devices: To be determined at SNF  Discharge Condition: Fair  CODE STATUS: DO NOT RESUSCITATE Diet recommendation: Cardiac  Brief/Interim Summary: [name] is a 84 yo F with chronic diastolic CHF, Afib not on AC due to history of intracranial bleeding, hypothyroidism, dementia, and coronary disease who presented on 3/20 with progressive dyspnea, leg swelling, found to be in CHF and Afib with RVR.     Acute on chronic diastolic CHF Diuresed 4L on IV Lasix.  Resumed home Lasix at discharge.  Appeared euvolemic.  She has diastolic CHF, no indication for ACE/ARB.  Chronic respiratory failure Was actually able to wean off O2 at rest at discharge.  Resume supplemental O2 at discharge.  Atrial fibrillation, chronic Afib with RVR Treated with diltiazem drip.  Oral diltiazem increased before discharge.  Not on AC despite CHADs2Vasc >8 due to history of intracranial bleeding.  Dementia  Depression  Hypothyroidism Levothyroxine continued. TSH 4.2 in January.  Coronary disease Hypertension History of cerebrovascular disease Elevated troponin Started on aspirin.  Metoprolol continued.  BP well controlled.  Troponin level low and flat not consistent with ACS, Cardiology deferred ischemic workup.  Left great toe pain No redness or swelling of the joint to suggest acute gouty flare.  Chronic kidney disease Stage III Baseline 1.2-1.3      Discharge Diagnoses:  Principal Problem:   Acute on chronic diastolic CHF (congestive  heart failure) (HCC) Active Problems:   Atrial fibrillation with RVR (HCC)   Permanent atrial fibrillation (HCC)   Major depression   Hypothyroidism   Hypertension   Gout, unspecified   Hyperkalemia   Dementia   CAD (coronary artery disease)   Elevated troponin   Pressure injury of skin    Discharge Instructions  Discharge Instructions    Diet - low sodium heart healthy   Complete by:  As directed    Discharge instructions   Complete by:  As directed    You were admitted for trouble breathing and feeling out of breath that was from congestive heart failure.   This was treated with diuretics to get the fluid off.  Resume Lasix 40 mg daily  Resume your potassium supplement We have increased your diltiazem.   Continue your metoprolol.   Start an aspirin   Discharge to SNF when bed available   Complete by:  As directed    Heart Failure patients record your daily weight using the same scale at the same time of day   Complete by:  As directed    Increase activity slowly   Complete by:  As directed    STOP any activity that causes chest pain, shortness of breath, dizziness, sweating, or exessive weakness   Complete by:  As directed      Allergies as of 10/16/2017      Reactions   Iohexol Nausea And Vomiting   Iodinated Diagnostic Agents Nausea And Vomiting   Z-pak [azithromycin] Other (See Comments)   Reaction unknown by family      Medication List    STOP taking  these medications   labetalol 300 MG tablet Commonly known as:  NORMODYNE     TAKE these medications   allopurinol 100 MG tablet Commonly known as:  ZYLOPRIM Take 1 tablet (100 mg total) by mouth daily.   aspirin 81 MG EC tablet Take 1 tablet (81 mg total) by mouth daily. Start taking on:  10/17/2017   diltiazem 180 MG 24 hr capsule Commonly known as:  CARDIZEM CD Take 1 capsule (180 mg total) by mouth daily. What changed:    medication strength  how much to take   donepezil 10 MG  tablet Commonly known as:  ARICEPT Take 1 tablet (10 mg total) by mouth at bedtime. What changed:    how much to take  how to take this  when to take this  additional instructions   ferrous sulfate 325 (65 FE) MG EC tablet Take 1 tablet (325 mg total) by mouth 2 (two) times daily with a meal.   furosemide 40 MG tablet Commonly known as:  LASIX Take 1 tablet (40 mg total) by mouth daily.   hydrALAZINE 25 MG tablet Commonly known as:  APRESOLINE TAKE 3 TABLETS 3 TIMES A DAY What changed:  See the new instructions.   ipratropium-albuterol 0.5-2.5 (3) MG/3ML Soln Commonly known as:  DUONEB Take 3 mLs by nebulization every 6 (six) hours as needed (SOB/wheezing).   isosorbide mononitrate 30 MG 24 hr tablet Commonly known as:  IMDUR Take 1 tablet (30 mg total) by mouth 2 (two) times daily.   levothyroxine 25 MCG tablet Commonly known as:  SYNTHROID, LEVOTHROID Take 25 mcg by mouth daily before breakfast.   metoprolol succinate 25 MG 24 hr tablet Commonly known as:  TOPROL-XL Take 1 tablet (25 mg total) by mouth daily.   NUTRITIONAL SUPPLEMENT Liqd Take 120 mLs by mouth as needed.   nystatin cream Commonly known as:  MYCOSTATIN Apply 1 application topically 2 (two) times daily. To gluteal crease   OXYGEN Inhale 2 L into the lungs as needed.   potassium chloride 10 MEQ tablet Commonly known as:  K-DUR Take 2 tablets (20 mEq total) by mouth daily.   sertraline 50 MG tablet Commonly known as:  ZOLOFT Take 1 tablet (50 mg total) by mouth daily.      Contact information for after-discharge care    Destination    [facility] .   Service:  Skilled Nursing Contact information: [address] [place] [phone]             Allergies  Allergen Reactions  . Iohexol Nausea And Vomiting  . Iodinated Diagnostic Agents Nausea And Vomiting  . Z-Pak [Azithromycin] Other (See Comments)    Reaction unknown by family     Consultations:  Cardiology   Procedures/Studies: Dg Chest Port 1 View  Result Date: 10/09/2017 CLINICAL DATA:  Shortness of breath.  Atrial fibrillation. EXAM: PORTABLE CHEST 1 VIEW COMPARISON:  Most recent radiograph 06/29/2017. Most recent CT 01/11/2017 FINDINGS: Marked cardiomegaly, unchanged. Moderate bilateral pleural effusions, similar to prior exam. There is central pulmonary edema, with progression. No pneumothorax or new focal airspace disease. Multiple skin folds project over the left chest. IMPRESSION: Progressive pulmonary edema from prior exam. Chronic cardiomegaly and moderate bilateral pleural effusions. Electronically Signed   By: Jeb Levering M.D.   On: 10/09/2017 22:38   Dg Foot Complete Left  Result Date: 10/15/2017 CLINICAL DATA:  Left foot pain for 1 month. No injury. History of gout and diabetes.  EXAM: LEFT FOOT - COMPLETE 3+ VIEW COMPARISON:  No recent prior. FINDINGS: Diffuse osteopenia and degenerative change. Degenerative changes most prominent about the first MTP joint. No acute bony abnormality. No evidence of fracture or dislocation. No radiopaque foreign body. IMPRESSION: Diffuse osteopenia and degenerative change. Degenerative changes most prominent about the first MTP joint. No acute abnormality. Electronically Signed   By: Marcello Moores  Register   On: 10/15/2017 12:26       Subjective: Feels well.  No chest pain, no dyspnea.  Good appetite.  Left toe pain no change.  Discharge Exam: Vitals:   10/16/17 0500 10/16/17 0750  BP: (!) 158/84 (!) 161/97  Pulse: 75   Resp: 18 (!) 21  Temp: 97.7 F (36.5 C)   SpO2: 96%    Vitals:   10/15/17 1414 10/15/17 1957 10/16/17 0500 10/16/17 0750  BP: 127/68 132/75 (!) 158/84 (!) 161/97  Pulse: 89 89 75   Resp: 20 18 18  (!) 21  Temp: 99.4 F (37.4 C) 98.7 F (37.1 C) 97.7 F (36.5 C)   TempSrc: Oral Oral Oral   SpO2: 98% 94% 96%   Weight:   59.8 kg (131 lb 13.4 oz)     General: Pt is alert, awake, not in  acute distress Cardiovascular: RRR, S1/S2 +, no rubs, no gallops Respiratory: CTA bilaterally, no wheezing, no rhonchi Abdominal: Soft, NT, ND, bowel sounds + Extremities: no edema, no cyanosis, on the bottom of the left great toe there is a small shallow ulcer, wtihout oozing.  No pain or redness or swelling fo the 1st MTP    The results of significant diagnostics from this hospitalization (including imaging, microbiology, ancillary and laboratory) are listed below for reference.     Microbiology: No results found for this or any previous visit (from the past 240 hour(s)).   Labs: BNP (last 3 results) Recent Labs    06/29/17 1204 07/11/17 0851 10/09/17 2020  BNP 957.4* 554* 209.4*   Basic Metabolic Panel: Recent Labs  Lab 10/11/17 0359 10/12/17 0505 10/13/17 0517 10/14/17 0434 10/15/17 0801  NA 140 140 140 139 142  K 3.7 3.1* 3.1* 3.2* 3.5  CL 96* 94* 94* 93* 97*  CO2 32 35* 35* 36* 36*  GLUCOSE 137* 78 83 58* 86  BUN 15 12 14 15 17   CREATININE 1.29* 1.23* 1.18* 1.17* 1.23*  CALCIUM 7.7* 7.6* 7.7* 7.7* 8.0*   Liver Function Tests: No results for input(s): AST, ALT, ALKPHOS, BILITOT, PROT, ALBUMIN in the last 168 hours. No results for input(s): LIPASE, AMYLASE in the last 168 hours. No results for input(s): AMMONIA in the last 168 hours. CBC: Recent Labs  Lab 10/09/17 2020  WBC 5.4  NEUTROABS 4.4  HGB 14.6  HCT 42.1  MCV 79.1  PLT 155   Cardiac Enzymes: Recent Labs  Lab 10/09/17 2308 10/10/17 0045 10/10/17 0344  TROPONINI 0.04* 0.03* 0.04*   BNP: Invalid input(s): POCBNP CBG: Recent Labs  Lab 10/13/17 1202  GLUCAP 122*   D-Dimer No results for input(s): DDIMER in the last 72 hours. Hgb A1c No results for input(s): HGBA1C in the last 72 hours. Lipid Profile No results for input(s): CHOL, HDL, LDLCALC, TRIG, CHOLHDL, LDLDIRECT in the last 72 hours. Thyroid function studies No results for input(s): TSH, T4TOTAL, T3FREE, THYROIDAB in the last  72 hours.  Invalid input(s): FREET3 Anemia work up No results for input(s): VITAMINB12, FOLATE, FERRITIN, TIBC, IRON, RETICCTPCT in the last 72 hours. Urinalysis    Component Value Date/Time  COLORURINE AMBER (A) 05/23/2017 0156   APPEARANCEUR HAZY (A) 05/23/2017 0156   LABSPEC 1.026 05/23/2017 0156   PHURINE 5.0 05/23/2017 0156   GLUCOSEU NEGATIVE 05/23/2017 0156   HGBUR NEGATIVE 05/23/2017 0156   BILIRUBINUR SMALL (A) 05/23/2017 0156   KETONESUR NEGATIVE 05/23/2017 0156   PROTEINUR >=300 (A) 05/23/2017 0156   UROBILINOGEN 2.0 (H) 04/13/2015 1844   NITRITE NEGATIVE 05/23/2017 0156   LEUKOCYTESUR NEGATIVE 05/23/2017 0156   Sepsis Labs Invalid input(s): PROCALCITONIN,  WBC,  LACTICIDVEN Microbiology No results found for this or any previous visit (from the past 240 hour(s)).   Time coordinating discharge: Less than 30 minutes  SIGNED:   Edwin Dada, MD  [facility] 10/16/2017, 10:53 AM

## 2017-10-16 NOTE — Progress Notes (Addendum)
Progress Note  Patient Name: Nicole [name] Date of Encounter: 10/16/2017  Primary Cardiologist: Pixie Casino, MD   Subjective   She looks stronger today, denies SOB or LE edema  Inpatient Medications    Scheduled Meds: . allopurinol  100 mg Oral Daily  . aspirin EC  81 mg Oral Daily  . diclofenac sodium  2 g Topical QID  . diltiazem  120 mg Oral Daily  . donepezil  10 mg Oral QHS  . enoxaparin (LOVENOX) injection  40 mg Subcutaneous Daily  . ferrous sulfate  325 mg Oral BID WC  . furosemide  40 mg Oral Daily  . hydrALAZINE  75 mg Oral Q8H  . isosorbide mononitrate  30 mg Oral BID  . levothyroxine  25 mcg Oral QAC breakfast  . metoprolol succinate  25 mg Oral Daily  . potassium chloride  10 mEq Oral Daily  . sertraline  50 mg Oral Daily  . sodium chloride flush  3 mL Intravenous Q12H   Continuous Infusions: . sodium chloride     PRN Meds: sodium chloride, acetaminophen, dextromethorphan-guaiFENesin, hydrALAZINE, hydrOXYzine, ipratropium, levalbuterol, morphine injection, nitroGLYCERIN, sodium chloride flush, zolpidem   Vital Signs    Vitals:   10/15/17 1414 10/15/17 1957 10/16/17 0500 10/16/17 0750  BP: 127/68 132/75 (!) 158/84 (!) 161/97  Pulse: 89 89 75   Resp: 20 18 18  (!) 21  Temp: 99.4 F (37.4 C) 98.7 F (37.1 C) 97.7 F (36.5 C)   TempSrc: Oral Oral Oral   SpO2: 98% 94% 96%   Weight:   131 lb 13.4 oz (59.8 kg)     Intake/Output Summary (Last 24 hours) at 10/16/2017 0818 Last data filed at 10/16/2017 0800 Gross per 24 hour  Intake 1320 ml  Output 1500 ml  Net -180 ml   Filed Weights   10/14/17 0415 10/15/17 0500 10/16/17 0500  Weight: 138 lb 7.2 oz (62.8 kg) 129 lb 3 oz (58.6 kg) 131 lb 13.4 oz (59.8 kg)    Telemetry    A flutter VR-90-100 with PVCs - Personally Reviewed  ECG    No new - Personally Reviewed  Physical Exam   GEN: No acute distress.   Neck: no JVD noted Cardiac: irreg irreg, 2/6 systolic murmur rubs, or gallops.    Respiratory: Clear though diminished breath sounds to auscultation bilaterally, Lt>Rt GI: Soft, nontender, non-distended MS: No edema; No deformity. Neuro:  Nonfocal  Psych: Normal affect   Labs    Chemistry Recent Labs  Lab 10/13/17 0517 10/14/17 0434 10/15/17 0801  NA 140 139 142  K 3.1* 3.2* 3.5  CL 94* 93* 97*  CO2 35* 36* 36*  GLUCOSE 83 58* 86  BUN 14 15 17   CREATININE 1.18* 1.17* 1.23*  CALCIUM 7.7* 7.7* 8.0*  GFRNONAA 41* 42* 39*  GFRAA 48* 48* 45*  ANIONGAP 11 10 9      Hematology Recent Labs  Lab 10/09/17 2020  WBC 5.4  RBC 5.32*  HGB 14.6  HCT 42.1  MCV 79.1  MCH 27.4  MCHC 34.7  RDW 18.3*  PLT 155    Cardiac Enzymes Recent Labs  Lab 10/09/17 2308 10/10/17 0045 10/10/17 0344  TROPONINI 0.04* 0.03* 0.04*   No results for input(s): TROPIPOC in the last 168 hours.   BNP Recent Labs  Lab 10/09/17 2020  BNP 892.5*     Radiology    PCXR-10/09/17- PORTABLE CHEST 1 VIEW  COMPARISON:  Most recent radiograph 06/29/2017. Most recent CT 01/11/2017  FINDINGS: Marked cardiomegaly, unchanged. Moderate bilateral pleural effusions, similar to prior exam. There is central pulmonary edema, with progression. No pneumothorax or new focal airspace disease. Multiple skin folds project over the left chest.  IMPRESSION: Progressive pulmonary edema from prior exam. Chronic cardiomegaly and moderate bilateral pleural effusions.   Cardiac Studies   Echocardiogram 10/10/2017: Study Conclusions  - Left ventricle: The cavity size was normal. Wall thickness was normal. Systolic function was normal. The estimated ejection fraction was in the range of 55% to 60%. - Mitral valve: There was mild regurgitation. - Left atrium: The atrium was moderately dilated. - Right ventricle: The cavity size was mildly dilated. Systolic function was mildly to moderately reduced. - Right atrium: The atrium was severely dilated. - Tricuspid valve: There was  moderate regurgitation. - Pulmonic valve: There was moderate regurgitation. - Pericardium, extracardiac: A trivial pericardial effusion was identified. There was a left pleural effusion.    Patient Profile     84 y.o. female with chronic atrial fibrillation, history of GI bleeding (not on anticoagulation), chronic diastolic heart failure with prior history of cardiomyopathy, hypertension, hyperlipidemia, dementia, and asymptomatic ascending thoracic aortic aneurysm. She was admitted 10/09/17 with acute on chronic diastolic heart failure.  Assessment & Plan    Acute on chronic diastolic HF with EF 77-82%, with mild to moderate RV dysfunction  --neg 4630 total --wt down from 143 lbs to 131 lbs --lasix transitioned to po and dose decreased to 40 mg daily --I think we are at her dry weight  Hypokalemia no labs today  Chronic coarse atrial fib/ flutter on dilt CD and Torpol XL --HR is running a little fast, 90-100 at rest.   --no anticoagulation though CHA2DS2VASc is 9 due to GI bleed.   Flat troponin, due to HF, not consistent with ACS     Ascending thoracic aortic aneurysm, 4.8 CM --asymptomatic.   Weakness per IM, continue PT   Pleural effusion on exam consider f/u CXR  Plan: MD to see, increase Diltiazem to 180 mg daily.  Needs SNF at [place].   For questions or updates, please contact [place] Please consult [email or web address] for contact info under Cardiology/STEMI.      Signed, Kerin Ransom, PA-C  10/16/2017, 8:18 AM    Attending Note:   The patient was seen and examined.  Agree with assessment and plan as noted above.  Changes made to the above note as needed.  Patient seen and independently examined with Kerin Ransom , PA .   We discussed all aspects of the encounter. I agree with the assessment and plan as stated above.  1.  Acute on chronic diastolic congestive heart failure: Nicole [name] seems to be doing quite well.  She is back down to her dry weight.  We are now on  oral diuretics.  Continue current medications.  She is scheduled to go to skilled nursing facility.  2.  Atrial for ablation/atrial flutter: Patient is stable.  She is not on anticoagulation because of a history of GI bleed. HR is still rapid.   Will increase dilt to 180 a day   3.  Elevated troponin level: Is consistent with a CHF exacerbation.  The trend is relatively flat and is not consistent with an acute coronary syndrome.  4.  Hypertension: Pressure remains on the upper end of normal.  Continue titration by the doctor at the skilled nursing facility.   I have spent a total of 40 minutes with patient reviewing hospital  notes , telemetry, EKGs,  labs and examining patient as well as establishing an assessment and plan that was discussed with the patient. > 50% of time was spent in direct patient care.    Thayer Headings, Brooke Bonito., MD, [facility] 10/16/2017, 11:20 AM [address],  [place] Pager [phone]

## 2017-10-16 NOTE — Clinical Social Work Placement (Signed)
   CLINICAL SOCIAL WORK PLACEMENT  NOTE  Date:  10/16/2017  Patient Details  Name: Nicole [name] MRN: [id number] Date of Birth: 03-27-1931  Clinical Social Work is seeking post-discharge placement for this patient at the [place] level of care (*CSW will initial, date and re-position this form in  chart as items are completed):  Yes   Patient/family provided with [place] Work Department's list of facilities offering this level of care within the geographic area requested by the patient (or if unable, by the patient's family).  Yes   Patient/family informed of their freedom to choose among providers that offer the needed level of care, that participate in Medicare, Medicaid or managed care program needed by the patient, have an available bed and are willing to accept the patient.  Yes   Patient/family informed of [place]'s ownership interest in [facility] and [facility], as well as of the fact that they are under no obligation to receive care at these facilities.  PASRR submitted to EDS on       PASRR number received on       Existing PASRR number confirmed on 10/15/17     FL2 transmitted to all facilities in geographic area requested by pt/family on 10/15/17     FL2 transmitted to all facilities within larger geographic area on 10/16/17     Patient informed that his/her managed care company has contracts with or will negotiate with certain facilities, including the following:  [facility]     Yes   Patient/family informed of bed offers received.  Patient chooses bed at [facility]     Physician recommends and patient chooses bed at      Patient to be transferred to [facility] on 10/16/17.  Patient to be transferred to facility by [facility]     Patient family notified on 10/16/17 of transfer.  Name of family member notified:  Peter Congo, daughter; Cecil Cranker, goddaughter     PHYSICIAN Please sign DNR      Additional Comment:    _______________________________________________ Estanislado Emms, LCSW 10/16/2017, 11:59 AM

## 2017-10-16 NOTE — Progress Notes (Signed)
Patient will discharge to [facility] Anticipated discharge date: 10/16/17 Family notified: Peter Congo, daughter; Cecil Cranker, goddaughter Transportation by: Corey Harold  Nurse to call report to [phone].   CSW signing off.  Estanislado Emms, [place]  Clinical Social Worker

## 2017-10-17 ENCOUNTER — Ambulatory Visit: Payer: Medicare HMO | Admitting: Internal Medicine

## 2017-10-17 DIAGNOSIS — R69 Illness, unspecified: Secondary | ICD-10-CM | POA: Diagnosis not present

## 2017-10-17 DIAGNOSIS — E039 Hypothyroidism, unspecified: Secondary | ICD-10-CM | POA: Diagnosis not present

## 2017-10-17 DIAGNOSIS — I5033 Acute on chronic diastolic (congestive) heart failure: Secondary | ICD-10-CM | POA: Diagnosis not present

## 2017-10-21 DIAGNOSIS — I5033 Acute on chronic diastolic (congestive) heart failure: Secondary | ICD-10-CM | POA: Diagnosis not present

## 2017-10-21 DIAGNOSIS — J9610 Chronic respiratory failure, unspecified whether with hypoxia or hypercapnia: Secondary | ICD-10-CM | POA: Diagnosis not present

## 2017-10-21 DIAGNOSIS — R69 Illness, unspecified: Secondary | ICD-10-CM | POA: Diagnosis not present

## 2017-10-21 DIAGNOSIS — I482 Chronic atrial fibrillation: Secondary | ICD-10-CM | POA: Diagnosis not present

## 2017-10-23 DIAGNOSIS — I5033 Acute on chronic diastolic (congestive) heart failure: Secondary | ICD-10-CM | POA: Diagnosis not present

## 2017-10-23 DIAGNOSIS — J189 Pneumonia, unspecified organism: Secondary | ICD-10-CM | POA: Diagnosis not present

## 2017-10-28 ENCOUNTER — Encounter: Payer: Self-pay | Admitting: Internal Medicine

## 2017-10-28 ENCOUNTER — Ambulatory Visit: Payer: Medicare HMO | Admitting: Internal Medicine

## 2017-10-28 ENCOUNTER — Other Ambulatory Visit: Payer: Self-pay | Admitting: Internal Medicine

## 2017-10-28 VITALS — BP 133/72 | HR 72 | Ht 68.0 in | Wt 129.4 lb

## 2017-10-28 DIAGNOSIS — I5032 Chronic diastolic (congestive) heart failure: Secondary | ICD-10-CM

## 2017-10-28 DIAGNOSIS — I5031 Acute diastolic (congestive) heart failure: Secondary | ICD-10-CM

## 2017-10-28 DIAGNOSIS — I4892 Unspecified atrial flutter: Secondary | ICD-10-CM | POA: Diagnosis not present

## 2017-10-28 DIAGNOSIS — I712 Thoracic aortic aneurysm, without rupture, unspecified: Secondary | ICD-10-CM

## 2017-10-28 NOTE — Patient Instructions (Signed)
Your physician recommends that you continue on your current medications as directed. Please refer to the Current Medication list given to you today.  Your physician recommends that you schedule a follow-up appointment in [place] with Dr. Debara Pickett.

## 2017-10-28 NOTE — Progress Notes (Signed)
OFFICE NOTE  Chief Complaint:  Follow-up hospitalization  Primary Care Physician: [name], DO  HPI:  Nicole [name] is a 84 year old female with history of chronic atrial fibrillation and controlled ventricular response. She was previously followed by Dr. Rex Kras, has a history of congestive heart failure with an EF of about 45% and moderate MR in the past. She has a thoracic aneurysm which has been asymptomatic, as well as hypertension, diabetes, dyslipidemia, and recently was admitted for gallbladder surgery which was complicated by bleeding issues. She has been deemed not a good candidate for Coumadin due to a number of issues with compliance by Dr. Rex Kras, and has been on aspirin and Plavix. Unfortunately, with regard to the Plavix she had complicated bleeding postoperatively from her gallbladder surgery and Plavix was discontinued due to this. It turns out that there is little benefit to it additional Plavix on top of aspirin for stroke reduction. As far as her stroke risk is concerned, it is actually fairly high considering she is in chronic atrial fibrillation and does have a history of hypertension and diabetes and is a female with a CHADS2 score of 3, a CHADS VASc score of 3-1/2 or 4.  Based on this, I recommended starting her on Eliquis.   Nicole [name] returns today for followup. Unfortunately she was just in the hospital with epistaxis. This is following a period of time where she developed small bowel obstruction and had a nasogastric tube in place. This could contribute to possible erosions. She was seen by an ear nose and throat specialist to placed a nasal balloon and ultimately packed her nose. This packing was in for a week and was removed and no further bleeding was noted. It was not mentioned what the cause of her bleeding was. She was on adequate. She still has a high CHADSVASC score. We had recommended discontinuing her eloquence due to bleeding. She was also in A. fib  with RVR and I recommended increasing her diltiazem to 240 mg daily but that was not performed. She's had no further epistaxis.  I saw Nicole [name] back today in the office. Overall she reports feeling very well. Unfortunate she's recently been having significant falls. She says she's been falling out of bed amongst other things and eventually developed significant bruising and has a large resolving hematoma on her right chin. Fortunately there was no intracranial bleeding. She is maintained on warfarin but is not had a check of her INR since March. Her last check in our office was in December. It's unclear why she felt that she did not need to come back for ongoing monthly INR checks. This of warfarin, but fortunately her INR today is 2.7. She remains in atrial fibrillation with a CHADSVASC score of 4. She also has a history of thoracic aortic aneurysm measuring up to 4.8 cm. This is been stable and recently when she had a neck CT, this is partially visualized and also measured 4.8 cm.   Nicole [name] returns today for hospital follow-up. Unfortunately in September she was hospitalized after acute confusion. Seen in the ER a CT scan indicated an acute intracerebral hemorrhage. This was significant with mass effect. Neurology and neurosurgery were consult it. She was on warfarin however INR was just above 3. This was urgently reversed. Blood pressure however was elevated at over 160/120. It was thought that this bleeding was due to hypertensive emergency. Subsequently she had a long hospitalization and rehabilitation. She was taken off of warfarin and  recently followed up with her neurologist , who suggested she could go on low-dose aspirin. She understands after and explanation by me today that the risk of ischemic stroke is still quite high.  Her CHADSVASC or is at least 6.  We did spend some time discussing an alternative which may be the watchman left atrial appendage occluder device.  12/09/2015  Nicole [name]  was seen back today in follow-up. Unfortunately she's had bleeding on anticoagulation and is at high risk for anticoagulation going forward. I had a discussion with her neurologist Dr. Leonie Man regarding the possibility of the watchman and left atrial appendage occluder device. The concern is that she would have to be on 2 months of anticoagulation which he did not feel that she would tolerate. Therefore she is deemed not an anticoagulation candidate and if she is not a watchman candidate would remain on aspirin but at high risk for stroke. Heart failure seems to be well compensated. Blood pressure is at goal weight is stable.  07/27/2016  Nicole [name] was seen back today in follow-up. Overall she is doing fairly well. She's had close to 10 pound weight gain over the past several months however this may be a weight "rebound". She initially had lost some weight and feels like her appetite has improved somewhat and she seems to be eating better. She denies any worsening shortness of breath, orthopnea or lower extremity edema. Sure he is slightly elevated today. Memory seems to be somewhat worse according to her caregiver.  01/24/2017  Nicole [name] returns for follow-up. Unfortunately she was recently hospitalized with recurrent intracerebral hemorrhage. She has chronic A. fib but is not a candidate for anticoagulation or watchman. She's been on low-dose aspirin however prognosis is poor. She's had dementia related to this and most recently a few days ago her son died. She seems to be very upset with this and anxious. She is asking for some medication to help with this but I deferred to her primary care provider. She does have a history of thoracic aneurysm which is been stable by repeat CT angiogram that was performed during this recent hospitalization of 4.8 cm. Regardless she is not a candidate for surgery. She does have a history of congestive heart failure and EF had improved up to 55-60% by echo in January 2017.  Apparently a palliative care discussion took place and I think it would be very reasonable to involve them in her care going forward.  07/22/2017  Nicole [name] was seen today in follow-up.  She has had a difficult fall.  Apparently both of her sons died.  She has been struggling with some depression and was hospitalized in November.  Subsequently she was readmitted to the emergency department with congestive heart failure.  She required nursing home stay.  She was just seen by her primary care provider and they had a long discussion about palliative and end-of-life issues.  She recently has had some worsening swelling.  A review of the chart shows very consistent recommendations for diuretics.  One point she was on 20 mg twice daily of Lasix, then 20 mg daily and at one point was only on Lasix as needed.  Currently her chart indicates 40 mg as needed.  According to her daughter she gets 20 mg daily which was recently decreased from 40 mg.  Overall she is about 7 pounds weight up from her last office visit in early December.  10/28/2017  Nicole [name] returns today for follow-up.  She was recently  admitted for acute on chronic diastolic heart failure and A. fib/flutter.  She was in rapid ventricular response and rate was better controlled with diltiazem.  She diuresed 4 L with IV Lasix to a euvolemic weight around 130 pounds.  She currently weighs 129 pounds.  Blood pressure stable and her A. fib rate is controlled at 72.  She denies any worsening shortness of breath.  She remains on 2 L nasal cannula.  She is still at rehabilitation center in [place], [place].  PMHx:   Past Medical History:  Diagnosis Date  . AAA (abdominal aortic aneurysm) ([place])   . Anemia   . Anxiety   . Arthritis   . Atrial fibrillation ([place])   . Cervicalgia   . Cholelithiasis   . Chronic systolic heart failure ([place])   . CKD (chronic kidney disease)   . Colon cancer ([place]) 07/1997  . Depression   . Diabetes mellitus ([place])   .  Dizziness and giddiness   . Electrolyte and fluid disorders not elsewhere classified   . GERD (gastroesophageal reflux disease)   . Gout, unspecified   . Hemorrhage of rectum and anus   . Hypercholesteremia   . Hypertension   . Hypoglycemia, unspecified   . Hypopotassemia   . Hypothyroidism   . Kidney stone    renal calculi  . Other chronic allergic conjunctivitis   . Other specified cardiac dysrhythmias(427.89)   . Pain in joint, lower leg   . Perforation of bile duct   . Proteinuria   . Shortness of breath   . Small bowel obstruction due to adhesions ([place]) 05/16/2012  . Stroke ([place])   . Swelling, mass, or lump in head and neck   . Thoracic aneurysm without mention of rupture    4.6 cm Asc Aortic aneurysm, CT 07/2015    Past Surgical History:  Procedure Laterality Date  . CARDIAC CATHETERIZATION  2008   moderate severe pulm HTN; with elevted pulm capillary wedge pressure   . CHOLECYSTECTOMY  05/14/2012   Procedure: LAPAROSCOPIC CHOLECYSTECTOMY WITH INTRAOPERATIVE CHOLANGIOGRAM;  Surgeon: Haywood Lasso, MD;  Location: [facility];  Service: General;  Laterality: N/A;  . ERCP  05/17/2012   Procedure: ENDOSCOPIC RETROGRADE CHOLANGIOPANCREATOGRAPHY (ERCP);  Surgeon: Missy Sabins, MD;  Location: [facility];  Service: Gastroenterology;  Laterality: N/A;  . ERCP  08/12/2012   Procedure: ENDOSCOPIC RETROGRADE CHOLANGIOPANCREATOGRAPHY (ERCP);  Surgeon: Missy Sabins, MD;  Location[facility] ENDOSCOPY;  Service: Endoscopy;  Laterality: N/A;  . HEMICOLECTOMY  08/11/1997  . KIDNEY STONE SURGERY    . TRANSTHORACIC ECHOCARDIOGRAM  11/2009   EF 45-50%, mild-mod AV regurg; mild MR; mod TR; ascending aorta mildly dilated    FAMHx:  Family History  Problem Relation Age of Onset  . Heart disease Mother   . Stroke Father   . Hypertension Daughter   . Hypertension Son   . Diabetes Sister   . Hypertension Son     SOCHx:   reports that she has never smoked. She has never used smokeless tobacco. She  reports that she does not drink alcohol or use drugs.  ALLERGIES:  Allergies  Allergen Reactions  . Iohexol Nausea And Vomiting  . Iodinated Diagnostic Agents Nausea And Vomiting  . Z-Pak [Azithromycin] Other (See Comments)    Reaction unknown by family    ROS: Pertinent items noted in HPI and remainder of comprehensive ROS otherwise negative.  HOME MEDS: Current Outpatient Medications  Medication Sig Dispense Refill  . allopurinol (ZYLOPRIM) 100 MG tablet  Take 1 tablet (100 mg total) by mouth daily. 90 tablet 1  . aspirin EC 81 MG EC tablet Take 1 tablet (81 mg total) by mouth daily. 30 tablet 6  . diltiazem (CARDIZEM CD) 180 MG 24 hr capsule Take 1 capsule (180 mg total) by mouth daily. 30 capsule 3  . donepezil (ARICEPT) 10 MG tablet Take 1 tablet (10 mg total) by mouth at bedtime. 30 tablet 0  . ferrous sulfate 325 (65 FE) MG EC tablet Take 1 tablet (325 mg total) by mouth 2 (two) times daily with a meal. 60 tablet 3  . furosemide (LASIX) 40 MG tablet Take 1 tablet (40 mg total) by mouth daily. 90 tablet 3  . hydrALAZINE (APRESOLINE) 25 MG tablet TAKE 3 TABLETS 3 TIMES A DAY (Patient taking differently: TAKE 75 mg TABLETS 3 TIMES A DAY) 270 tablet 1  . ipratropium-albuterol (DUONEB) 0.5-2.5 (3) MG/3ML SOLN Take 3 mLs by nebulization every 6 (six) hours as needed (SOB/wheezing).    . isosorbide mononitrate (IMDUR) 30 MG 24 hr tablet Take 1 tablet (30 mg total) by mouth 2 (two) times daily. 180 tablet 1  . KLOR-CON 10 10 MEQ tablet TAKE 2 TABLETS (20 MEQ TOTAL) BY MOUTH DAILY. 60 tablet 2  . levothyroxine (SYNTHROID, LEVOTHROID) 25 MCG tablet Take 25 mcg by mouth daily before breakfast.    . metoprolol succinate (TOPROL-XL) 25 MG 24 hr tablet Take 1 tablet (25 mg total) by mouth daily. 90 tablet 3  . NUTRITIONAL SUPPLEMENT LIQD Take 120 mLs by mouth as needed.     . nystatin cream (MYCOSTATIN) Apply 1 application topically 2 (two) times daily. To gluteal crease 30 g 0  . OXYGEN  Inhale 2 L into the lungs as needed.    . sertraline (ZOLOFT) 50 MG tablet Take 1 tablet (50 mg total) by mouth daily. 30 tablet 3   No current facility-administered medications for this visit.     LABS/IMAGING: No results found for this or any previous visit (from the past 48 hour(s)). No results found.  VITALS: BP 133/72   Pulse 72   Ht 5\' 8"  (1.727 m)   Wt 129 lb 6.4 oz (58.7 kg)   BMI 19.68 kg/m   EXAM: General appearance: alert, no distress and seating in wheelchair Neck: JVD - 3 cm above sternal notch and no carotid bruit Lungs: rales RLL Heart: irregularly irregular rhythm Abdomen: soft, non-tender; bowel sounds normal; no masses,  no organomegaly Extremities: edema 1+ bilateral pitting edema Pulses: 2+ and symmetric Skin: Skin color, texture, turgor normal. No rashes or lesions Neurologic: Mental status: Slow thought process Psych: Mood is better today  EKG: Atrial flutter with variable AV block and PVCs at 72, nonspecific T wave changes-personally reviewed  ASSESSMENT: 1. Chronic atrial fibrillation - CHADSVASC score of 6 (not on anticoagulation now due to [place]) - not a watchman candidate, maintained on aspirin 2. Recent stroke with intracerebral hemorrhage 3. History of congestive heart failure, EF of 45% - up to 55-60% by echo (07/2015) 4. Hypertension 5. History of thoracic aneurysm - stable at 4.6-4.8 cm (07/2015) 6. Diabetes  7. Dyslipidemia 8. Chronic kidney disease 9. DNR 10. Chronic oxygen use  PLAN: 1.   [name] recently admitted with acute on chronic diastolic congestive heart failure and A. fib with RVR.  Rate is now better controlled and she diuresed back down to a weight of 130 pounds.  Currently she is 129 pounds and breathing is stable.  She is  on 40 mg daily Lasix.  Again she is not an anticoagulation candidate due to history of intracranial bleeding.  Blood pressure is at goal today.  Plan follow-up in 3 months.  We may consider reassessing her  thoracic aneurysm which measured 4.6-4.8 cm in 2017.  Follow-up 3 months  Pixie Casino, MD, [facility], [place] Director of the Advanced Lipid Disorders &  Cardiovascular Risk Reduction Clinic Attending Cardiologist  Direct Dial: [phone]  Fax: [phone]  Website:  [email or web address] 10/28/2017, 1:48 PM

## 2017-10-30 DIAGNOSIS — I5033 Acute on chronic diastolic (congestive) heart failure: Secondary | ICD-10-CM | POA: Diagnosis not present

## 2017-10-30 DIAGNOSIS — J9610 Chronic respiratory failure, unspecified whether with hypoxia or hypercapnia: Secondary | ICD-10-CM | POA: Diagnosis not present

## 2017-10-31 DIAGNOSIS — J9601 Acute respiratory failure with hypoxia: Secondary | ICD-10-CM | POA: Diagnosis not present

## 2017-10-31 DIAGNOSIS — M109 Gout, unspecified: Secondary | ICD-10-CM | POA: Diagnosis not present

## 2017-10-31 DIAGNOSIS — I509 Heart failure, unspecified: Secondary | ICD-10-CM | POA: Diagnosis not present

## 2017-10-31 DIAGNOSIS — L401 Generalized pustular psoriasis: Secondary | ICD-10-CM | POA: Diagnosis not present

## 2017-10-31 DIAGNOSIS — R69 Illness, unspecified: Secondary | ICD-10-CM | POA: Diagnosis not present

## 2017-10-31 DIAGNOSIS — Z66 Do not resuscitate: Secondary | ICD-10-CM | POA: Diagnosis not present

## 2017-10-31 DIAGNOSIS — R0602 Shortness of breath: Secondary | ICD-10-CM | POA: Diagnosis not present

## 2017-10-31 DIAGNOSIS — I110 Hypertensive heart disease with heart failure: Secondary | ICD-10-CM | POA: Diagnosis not present

## 2017-10-31 DIAGNOSIS — J449 Chronic obstructive pulmonary disease, unspecified: Secondary | ICD-10-CM | POA: Diagnosis not present

## 2017-10-31 DIAGNOSIS — J918 Pleural effusion in other conditions classified elsewhere: Secondary | ICD-10-CM | POA: Diagnosis not present

## 2017-10-31 DIAGNOSIS — I5031 Acute diastolic (congestive) heart failure: Secondary | ICD-10-CM | POA: Diagnosis not present

## 2017-10-31 DIAGNOSIS — R0902 Hypoxemia: Secondary | ICD-10-CM | POA: Diagnosis not present

## 2017-10-31 DIAGNOSIS — J9602 Acute respiratory failure with hypercapnia: Secondary | ICD-10-CM | POA: Diagnosis not present

## 2017-10-31 DIAGNOSIS — E876 Hypokalemia: Secondary | ICD-10-CM | POA: Diagnosis not present

## 2017-10-31 DIAGNOSIS — E875 Hyperkalemia: Secondary | ICD-10-CM | POA: Diagnosis not present

## 2017-10-31 DIAGNOSIS — J189 Pneumonia, unspecified organism: Secondary | ICD-10-CM | POA: Diagnosis not present

## 2017-10-31 DIAGNOSIS — R739 Hyperglycemia, unspecified: Secondary | ICD-10-CM | POA: Diagnosis not present

## 2017-10-31 DIAGNOSIS — I4891 Unspecified atrial fibrillation: Secondary | ICD-10-CM | POA: Diagnosis not present

## 2017-11-07 ENCOUNTER — Telehealth: Payer: Self-pay

## 2017-11-07 NOTE — Telephone Encounter (Signed)
I have made the 1st attempt to contact the patient or family member in charge, in order to follow up from recently being discharged from the hospital. I left a message on voicemail but I will make another attempt at a different time.  

## 2017-11-09 DIAGNOSIS — I110 Hypertensive heart disease with heart failure: Secondary | ICD-10-CM | POA: Diagnosis not present

## 2017-11-09 DIAGNOSIS — F0390 Unspecified dementia without behavioral disturbance: Secondary | ICD-10-CM

## 2017-11-09 DIAGNOSIS — I4891 Unspecified atrial fibrillation: Secondary | ICD-10-CM | POA: Diagnosis not present

## 2017-11-09 DIAGNOSIS — I509 Heart failure, unspecified: Secondary | ICD-10-CM | POA: Diagnosis not present

## 2017-11-09 DIAGNOSIS — E039 Hypothyroidism, unspecified: Secondary | ICD-10-CM | POA: Diagnosis not present

## 2017-11-09 DIAGNOSIS — M6281 Muscle weakness (generalized): Secondary | ICD-10-CM

## 2017-11-09 DIAGNOSIS — M109 Gout, unspecified: Secondary | ICD-10-CM | POA: Diagnosis not present

## 2017-11-09 DIAGNOSIS — J449 Chronic obstructive pulmonary disease, unspecified: Secondary | ICD-10-CM | POA: Diagnosis not present

## 2017-11-09 DIAGNOSIS — D509 Iron deficiency anemia, unspecified: Secondary | ICD-10-CM | POA: Diagnosis not present

## 2017-11-09 DIAGNOSIS — R69 Illness, unspecified: Secondary | ICD-10-CM | POA: Diagnosis not present

## 2017-11-11 ENCOUNTER — Telehealth: Payer: Self-pay

## 2017-11-11 NOTE — Telephone Encounter (Signed)
Transition Care Management Follow-Up Telephone Call   Date discharged and where: [facility] on 11/07/2017  How have you been since you were released from the hospital? Went in to hospital for shortness of breath. While there she was on a Bipap. Currently she is having no difficulty with breathing and is living at home with her significant other.  Any patient concerns? None  Items Reviewed:   Meds: Y  Allergies: Y  Dietary Changes Reviewed: Y  Functional Questionnaire:  Independent-I Dependent-D  ADLs:   Dressing- I    Eating- I   Maintaining continence- I   Transferring- I w/ assist of a cane   Transportation- D   Meal Prep- I w/ assist   Managing Meds- I w/ assist  Confirmed importance and Date/Time of follow-up visits scheduled: 11/21/2017 @ 9:30am Dr. Mariea Clonts   Confirmed with patient if condition worsens to call PCP or go to the Emergency Dept. Patient was given office number and encouraged to call back with questions or concerns: Yes

## 2017-11-11 NOTE — Telephone Encounter (Signed)
We will need records from the hospital where she stayed before the appt b/c pt will not be able to provide that history.

## 2017-11-11 NOTE — Telephone Encounter (Signed)
She went to [facility] in [place] 3/27.  Unknown if still there.  Should be in SNF long-term ideally.

## 2017-11-11 NOTE — Telephone Encounter (Signed)
I have made the 2nd attempt to contact the patient or family member in charge, in order to follow up from recently being discharged from the hospital. I left a message on voicemail but I will make another attempt at a different time.  

## 2017-11-15 ENCOUNTER — Other Ambulatory Visit: Payer: Self-pay

## 2017-11-15 MED ORDER — LEVOTHYROXINE SODIUM 25 MCG PO TABS: 25.0000 ug | ORAL_TABLET | Freq: Every day | ORAL | 1 refills | Status: DC

## 2017-11-15 NOTE — Telephone Encounter (Signed)
Patient's daughter, Peter Congo, called to request a refill on levothyroxine 25 mcg. Rx was sent to pharmacy electronically.

## 2017-11-20 ENCOUNTER — Other Ambulatory Visit: Payer: Self-pay | Admitting: *Deleted

## 2017-11-20 MED ORDER — POTASSIUM CHLORIDE ER 10 MEQ PO TBCR: EXTENDED_RELEASE_TABLET | ORAL | 2 refills | Status: DC

## 2017-11-20 NOTE — Telephone Encounter (Signed)
[facility] 

## 2017-11-21 ENCOUNTER — Encounter: Payer: Self-pay | Admitting: Internal Medicine

## 2017-11-21 ENCOUNTER — Ambulatory Visit (INDEPENDENT_AMBULATORY_CARE_PROVIDER_SITE_OTHER): Payer: Managed Care, Other (non HMO) | Admitting: Internal Medicine

## 2017-11-21 VITALS — BP 120/70 | HR 101 | Temp 97.5°F | Ht 68.0 in | Wt 129.0 lb

## 2017-11-21 DIAGNOSIS — I4891 Unspecified atrial fibrillation: Secondary | ICD-10-CM

## 2017-11-21 DIAGNOSIS — I5043 Acute on chronic combined systolic (congestive) and diastolic (congestive) heart failure: Secondary | ICD-10-CM | POA: Diagnosis not present

## 2017-11-21 DIAGNOSIS — E440 Moderate protein-calorie malnutrition: Secondary | ICD-10-CM | POA: Diagnosis not present

## 2017-11-21 DIAGNOSIS — F0150 Vascular dementia without behavioral disturbance: Secondary | ICD-10-CM | POA: Diagnosis not present

## 2017-11-21 DIAGNOSIS — R69 Illness, unspecified: Secondary | ICD-10-CM | POA: Diagnosis not present

## 2017-11-21 DIAGNOSIS — R627 Adult failure to thrive: Secondary | ICD-10-CM

## 2017-11-21 MED ORDER — IPRATROPIUM BROMIDE 0.02 % IN SOLN: 0.5000 mg | Freq: Four times a day (QID) | RESPIRATORY_TRACT | 1 refills | Status: AC | PRN

## 2017-11-21 NOTE — Progress Notes (Signed)
Location:  [facility] clinic Provider: Alenna Russell L. Mariea Clonts, D.O., C.M.D.  Code Status: DNR Goals of Care:  Advanced Directives 11/21/2017  Does Patient Have a Medical Advance Directive? Yes  Type of Paramedic of [place];Out of facility DNR (pink MOST or yellow form)  Does patient want to make changes to medical advance directive? No - Patient declined  Copy of [place] in Chart? Yes  Would patient like information on creating a medical advance directive? -  Pre-existing out of facility DNR order (yellow form or pink MOST form) Yellow form placed in chart (order not valid for inpatient use)     Chief Complaint  Patient presents with  . Transitions Of Care    discharged 11/07/17    HPI: Patient is a 84 y.o. female seen today for hospital follow-up s/p admission from 3/20-27 at [facility] for acute on chronic diastolic chf.  Echo was performed 10/10/17 with EF 55-60%, moderate LA dilatation, moderate pulmonic regurg, moderate tricuspid regurg, severe RA dilation and moderately reduced right ventricular function with trivial pericardial effusion, mild IVC dilation, left pleural effusion.  She was then to have f/u bmp in 1 wk for renal function and K and cXR in 3-4 wks plus monitoring of left great toe for gout flare.  She had been in afib/flutter with RVR with improved rate control with diltiazem.  She diuresed 4 liters with IV lasix to a euvolemic weight of 130 lbs.  She was 129 lbs at f/u with Dr. Debara Pickett on 4/8.  HR was 72.  She had been transferred to rehab at the [facility] in [place], [place] 3/27.  She apparently got hospitalized again at [facility] and was released 11/07/17.  She'd been sob and required bipap therapy.  She was no longer sob as of TOC call 11/11/17 and "living at home with her significant other".  She was scheduled her 14 day TOC with me today. Medications were reconciled with the [facility] "home medication list".   Major changes included addition of potassium 26meq, stopping duonebs and using atrovent nebs instead, and increase in diltiazem to 360mg  po daily.  She's no longer on an ace as she's had numerous hospitalization and it gets held more than she gets to take it when she gets acute renal failure.  She has been taken off anticoagulation due to her recurrent bleeds (had hemorrhagic stroke, severe bleeding with large hematoma on chin, recurrent epistaxis episodes).  She is now only on a baby asa and is on iron supplementation for anemia.    Says some days she feels good, some days she doesn't.  Her head hurts her sometimes.  Breathing is terrible.  Even feels short of breath at rest with oxygen on more than 3L at home and 3L on the portable machine.  She's sleepy today b/c she went to the dentist.  She is not going out as much.  She gets short-winded with little activity.  She has a home [employer].  Has not seen palliative care per caregiver.  She's been having regular bms.  She's complained of her bottom hurting.  For the past couple of months, she's had a sore on her bottom.  It got worse when she went back in the hospital.  She hollers if she sits on her couch too long.    Her weight is down to 129 lbs when she weighed 162 lbs in January.  This is over a 30 lb weight loss  in 3-4 mos.  Appetite is not like it used to be.    She apparently used the commode herself last week unassisted which is hard to imagine.    Past Medical History:  Diagnosis Date  . AAA (abdominal aortic aneurysm) ([place])   . Anemia   . Anxiety   . Arthritis   . Atrial fibrillation ([place])   . Cervicalgia   . Cholelithiasis   . Chronic systolic heart failure ([place])   . CKD (chronic kidney disease)   . Colon cancer ([place]) 07/1997  . Depression   . Diabetes mellitus ([place])   . Dizziness and giddiness   . Electrolyte and fluid disorders not elsewhere classified   . GERD (gastroesophageal reflux disease)   . Gout, unspecified   .  Hemorrhage of rectum and anus   . Hypercholesteremia   . Hypertension   . Hypoglycemia, unspecified   . Hypopotassemia   . Hypothyroidism   . Kidney stone    renal calculi  . Other chronic allergic conjunctivitis   . Other specified cardiac dysrhythmias(427.89)   . Pain in joint, lower leg   . Perforation of bile duct   . Proteinuria   . Shortness of breath   . Small bowel obstruction due to adhesions ([place]) 05/16/2012  . Stroke ([place])   . Swelling, mass, or lump in head and neck   . Thoracic aneurysm without mention of rupture    4.6 cm Asc Aortic aneurysm, CT 07/2015    Past Surgical History:  Procedure Laterality Date  . CARDIAC CATHETERIZATION  2008   moderate severe pulm HTN; with elevted pulm capillary wedge pressure   . CHOLECYSTECTOMY  05/14/2012   Procedure: LAPAROSCOPIC CHOLECYSTECTOMY WITH INTRAOPERATIVE CHOLANGIOGRAM;  Surgeon: Haywood Lasso, MD;  Location: [facility];  Service: General;  Laterality: N/A;  . ERCP  05/17/2012   Procedure: ENDOSCOPIC RETROGRADE CHOLANGIOPANCREATOGRAPHY (ERCP);  Surgeon: Missy Sabins, MD;  Location: [facility];  Service: Gastroenterology;  Laterality: N/A;  . ERCP  08/12/2012   Procedure: ENDOSCOPIC RETROGRADE CHOLANGIOPANCREATOGRAPHY (ERCP);  Surgeon: Missy Sabins, MD;  Location: [facility] ENDOSCOPY;  Service: Endoscopy;  Laterality: N/A;  . HEMICOLECTOMY  08/11/1997  . KIDNEY STONE SURGERY    . TRANSTHORACIC ECHOCARDIOGRAM  11/2009   EF 45-50%, mild-mod AV regurg; mild MR; mod TR; ascending aorta mildly dilated    Allergies  Allergen Reactions  . Iohexol Nausea And Vomiting  . Iodinated Diagnostic Agents Nausea And Vomiting  . Z-Pak [Azithromycin] Other (See Comments)    Reaction unknown by family    Outpatient Encounter Medications as of 11/21/2017  Medication Sig  . allopurinol (ZYLOPRIM) 100 MG tablet Take 1 tablet (100 mg total) by mouth daily.  Marland Kitchen aspirin EC 81 MG EC tablet Take 1 tablet (81 mg total) by mouth daily.  Marland Kitchen diltiazem  (CARDIZEM CD) 360 MG 24 hr capsule Take 360 mg by mouth daily.  Marland Kitchen donepezil (ARICEPT) 10 MG tablet Take 1 tablet (10 mg total) by mouth at bedtime.  . ferrous sulfate 325 (65 FE) MG EC tablet Take 1 tablet (325 mg total) by mouth 2 (two) times daily with a meal.  . furosemide (LASIX) 40 MG tablet Take 1 tablet (40 mg total) by mouth daily.  . hydrALAZINE (APRESOLINE) 25 MG tablet Take 25 mg by mouth 3 (three) times daily.  Marland Kitchen ipratropium (ATROVENT) 0.02 % nebulizer solution Take 0.5 mg by nebulization every 6 (six) hours as needed for wheezing or shortness of breath.  . isosorbide mononitrate (  IMDUR) 30 MG 24 hr tablet Take 1 tablet (30 mg total) by mouth 2 (two) times daily.  Marland Kitchen levothyroxine (SYNTHROID, LEVOTHROID) 25 MCG tablet Take 1 tablet (25 mcg total) by mouth daily before breakfast.  . metoprolol succinate (TOPROL-XL) 25 MG 24 hr tablet Take 1 tablet (25 mg total) by mouth daily.  . OXYGEN Inhale 2 L into the lungs as needed.  . potassium chloride (KLOR-CON 10) 10 MEQ tablet TAKE 2 TABLETS (20 MEQ TOTAL) BY MOUTH DAILY.  Marland Kitchen sertraline (ZOLOFT) 50 MG tablet Take 1 tablet (50 mg total) by mouth daily.  . [DISCONTINUED] diltiazem (CARDIZEM CD) 180 MG 24 hr capsule Take 1 capsule (180 mg total) by mouth daily.  . [DISCONTINUED] hydrALAZINE (APRESOLINE) 25 MG tablet TAKE 3 TABLETS 3 TIMES A DAY (Patient taking differently: TAKE 75 mg TABLETS 3 TIMES A DAY)  . [DISCONTINUED] ipratropium-albuterol (DUONEB) 0.5-2.5 (3) MG/3ML SOLN Take 3 mLs by nebulization every 6 (six) hours as needed (SOB/wheezing).  . [DISCONTINUED] NUTRITIONAL SUPPLEMENT LIQD Take 120 mLs by mouth as needed.   . [DISCONTINUED] nystatin cream (MYCOSTATIN) Apply 1 application topically 2 (two) times daily. To gluteal crease   No facility-administered encounter medications on file as of 11/21/2017.     Review of Systems:  Review of Systems  Constitutional: Positive for malaise/fatigue and weight loss. Negative for chills and  fever.  HENT: Positive for hearing loss. Negative for congestion.   Eyes: Negative for blurred vision.  Respiratory: Positive for shortness of breath.   Cardiovascular: Positive for leg swelling. Negative for chest pain and palpitations.       Minimal edema now  Gastrointestinal: Negative for abdominal pain, blood in stool, constipation and melena.  Genitourinary: Negative for dysuria.       Incontinent, uses depends  Musculoskeletal: Negative for falls.  Skin:       Buttock pain but no active pressure injury  Neurological: Positive for headaches.  Psychiatric/Behavioral: Positive for memory loss. Negative for depression.    Health Maintenance  Topic Date Due  . FOOT EXAM  05/24/2017  . URINE MICROALBUMIN  07/30/2017  . MAMMOGRAM  10/18/2017  . INFLUENZA VACCINE  02/20/2018  . HEMOGLOBIN A1C  04/12/2018  . OPHTHALMOLOGY EXAM  07/18/2018  . TETANUS/TDAP  09/16/2021  . DEXA SCAN  Completed  . PNA vac Low Risk Adult  Completed    Physical Exam: Vitals:   11/21/17 0939  BP: 120/70  Pulse: (!) 101  Temp: (!) 97.5 F (36.4 C)  TempSrc: Oral  SpO2: 96%  Weight: 129 lb (58.5 kg)  Height: 5\' 8"  (1.727 m)   Body mass index is 19.61 kg/m. Physical Exam  Constitutional: No distress.  Cachectic appearing female now, sitting in wheelchair, appears dyspneic at rest on 3L O2 with portable oxygen  Cardiovascular:  irreg irreg--extremely irregular with pauses  Pulmonary/Chest: She has no rales.  Increased effort, appears dyspneic at rest  Abdominal: Bowel sounds are normal.  Genitourinary:  Genitourinary Comments: Wears depends  Musculoskeletal: Normal range of motion.  Mild right weakness  Neurological: She is alert.  Oriented to person, place, not time  Skin: Skin is warm and dry.  Darkening of skin/scarring from prior pressure injury over sacrum, tender over bilateral ischial tuberosities, but no visible skin breakdown or deep tissue injury  Psychiatric: She has a normal  mood and affect.    Labs reviewed: Basic Metabolic Panel: Recent Labs    01/11/17 2123 01/12/17 0700  05/23/17 0517  07/25/17 1355  10/13/17  0258 10/14/17 0434 10/15/17 0801  NA  --  138   < >  --    < >  --    < > 140 139 142  K  --  3.4*   < >  --    < >  --    < > 3.1* 3.2* 3.5  CL  --  103   < >  --    < >  --    < > 94* 93* 97*  CO2  --  23   < >  --    < >  --    < > 35* 36* 36*  GLUCOSE  --  136*   < >  --    < >  --    < > 83 58* 86  BUN  --  12   < >  --    < >  --    < > 14 15 17   CREATININE  --  1.07*   < >  --    < >  --    < > 1.18* 1.17* 1.23*  CALCIUM  --  8.7*   < >  --    < >  --    < > 7.7* 7.7* 8.0*  MG  --  1.6*  --   --   --   --   --   --   --   --   TSH 0.244*  --   --  0.240*  --  4.29  --   --   --   --    < > = values in this interval not displayed.   Liver Function Tests: No results for input(s): AST, ALT, ALKPHOS, BILITOT, PROT, ALBUMIN in the last 8760 hours. No results for input(s): LIPASE, AMYLASE in the last 8760 hours. Recent Labs    01/11/17 2123  AMMONIA 24   CBC: Recent Labs    05/23/17 0156  05/25/17 0711 06/29/17 1204 10/09/17 2020  WBC 7.5   < > 6.6 5.9 5.4  NEUTROABS 6.2  --   --  4.4 4.4  HGB 13.0   < > 11.0* 12.2 14.6  HCT 37.5   < > 32.3* 35.7* 42.1  MCV 74.0*   < > 75.1* 76.0* 79.1  PLT 231   < > 171 218 155   < > = values in this interval not displayed.   Lipid Panel: Recent Labs    10/10/17 0344  CHOL 173  HDL 37*  LDLCALC 119*  TRIG 87  CHOLHDL 4.7   Lab Results  Component Value Date   HGBA1C 5.5 10/10/2017    Procedures since last visit: Dg Chest Port 1 View  Result Date: 10/09/2017 CLINICAL DATA:  Shortness of breath.  Atrial fibrillation. EXAM: PORTABLE CHEST 1 VIEW COMPARISON:  Most recent radiograph 06/29/2017. Most recent CT 01/11/2017 FINDINGS: Marked cardiomegaly, unchanged. Moderate bilateral pleural effusions, similar to prior exam. There is central pulmonary edema, with progression. No  pneumothorax or new focal airspace disease. Multiple skin folds project over the left chest. IMPRESSION: Progressive pulmonary edema from prior exam. Chronic cardiomegaly and moderate bilateral pleural effusions. Electronically Signed   By: Jeb Levering M.D.   On: 10/09/2017 22:38    Assessment/Plan 1. Acute on chronic combined systolic and diastolic heart failure (HCC) - acute exacerbation resolved -weight is stable since hospital discharge 2 wks ago (down 1 lb actually), appears euvolemic - Amb Referral to Palliative Care  for symptom mgt and to reestablish goals of care   2. Vascular dementia without behavioral disturbance - ongoing, remains on aricept therapy--seems more alert than usual today, but poor historian - Amb Referral to Palliative Care  3. Atrial fibrillation with RVR (HCC) - rate controlled with diltiazem 360 and toprol xl -cont baby asa only due to numerous prior bleeding events  -Amb Referral to Palliative Care  4. Adult failure to thrive - weight loss of over 30 lbs in 3-4 mos, some fluid, but some due to decreasing intake and advanced heart failure - Amb Referral to Palliative Care  5. Moderate protein-calorie malnutrition ([place]) - due to poor intake and recurrent hospitalizations - Amb Referral to Palliative Care  Labs/tests ordered:   Orders Placed This Encounter  Procedures  . Amb Referral to Palliative Care    Referral Priority:   Routine    Referral Type:   Consultation    Referral Reason:   Symptom Managment    Number of Visits Requested:   1    Next appt:  4 wks with Janett Billow to f/u on CHF  Evita Merida L. Waqas Bruhl, D.O. [facility] [address], [place] [address] Cell Phone (Mon-Fri 8am-5pm):  [phone] On Call:  [phone] & follow prompts after 5pm & weekends Office Phone:  [phone] Office Fax:  [phone]

## 2017-11-21 NOTE — Patient Instructions (Signed)
Palliative care referral was placed to help with symptom management at home to focus on comfort and quality of life.  You told me you wanted to stay out of the hospital.

## 2017-11-25 ENCOUNTER — Encounter: Payer: Self-pay | Admitting: Internal Medicine

## 2017-11-25 DIAGNOSIS — M6281 Muscle weakness (generalized): Secondary | ICD-10-CM | POA: Diagnosis not present

## 2017-11-25 DIAGNOSIS — I129 Hypertensive chronic kidney disease with stage 1 through stage 4 chronic kidney disease, or unspecified chronic kidney disease: Secondary | ICD-10-CM | POA: Diagnosis not present

## 2017-11-25 DIAGNOSIS — R69 Illness, unspecified: Secondary | ICD-10-CM | POA: Diagnosis not present

## 2017-11-25 DIAGNOSIS — R0602 Shortness of breath: Secondary | ICD-10-CM | POA: Diagnosis not present

## 2017-11-25 DIAGNOSIS — I5032 Chronic diastolic (congestive) heart failure: Secondary | ICD-10-CM | POA: Diagnosis not present

## 2017-11-25 DIAGNOSIS — M109 Gout, unspecified: Secondary | ICD-10-CM | POA: Diagnosis not present

## 2017-11-25 DIAGNOSIS — J189 Pneumonia, unspecified organism: Secondary | ICD-10-CM | POA: Diagnosis not present

## 2017-11-25 IMAGING — DX DG CHEST 1V PORT
1 series · 1 of 1 positions shown · non-contrast
Comparison: Chest radiograph July 27, 2015 and chest CT July 28, 2015

CLINICAL DATA: Shortness of Breath

EXAM:
PORTABLE CHEST 1 VIEW

[chest ap]
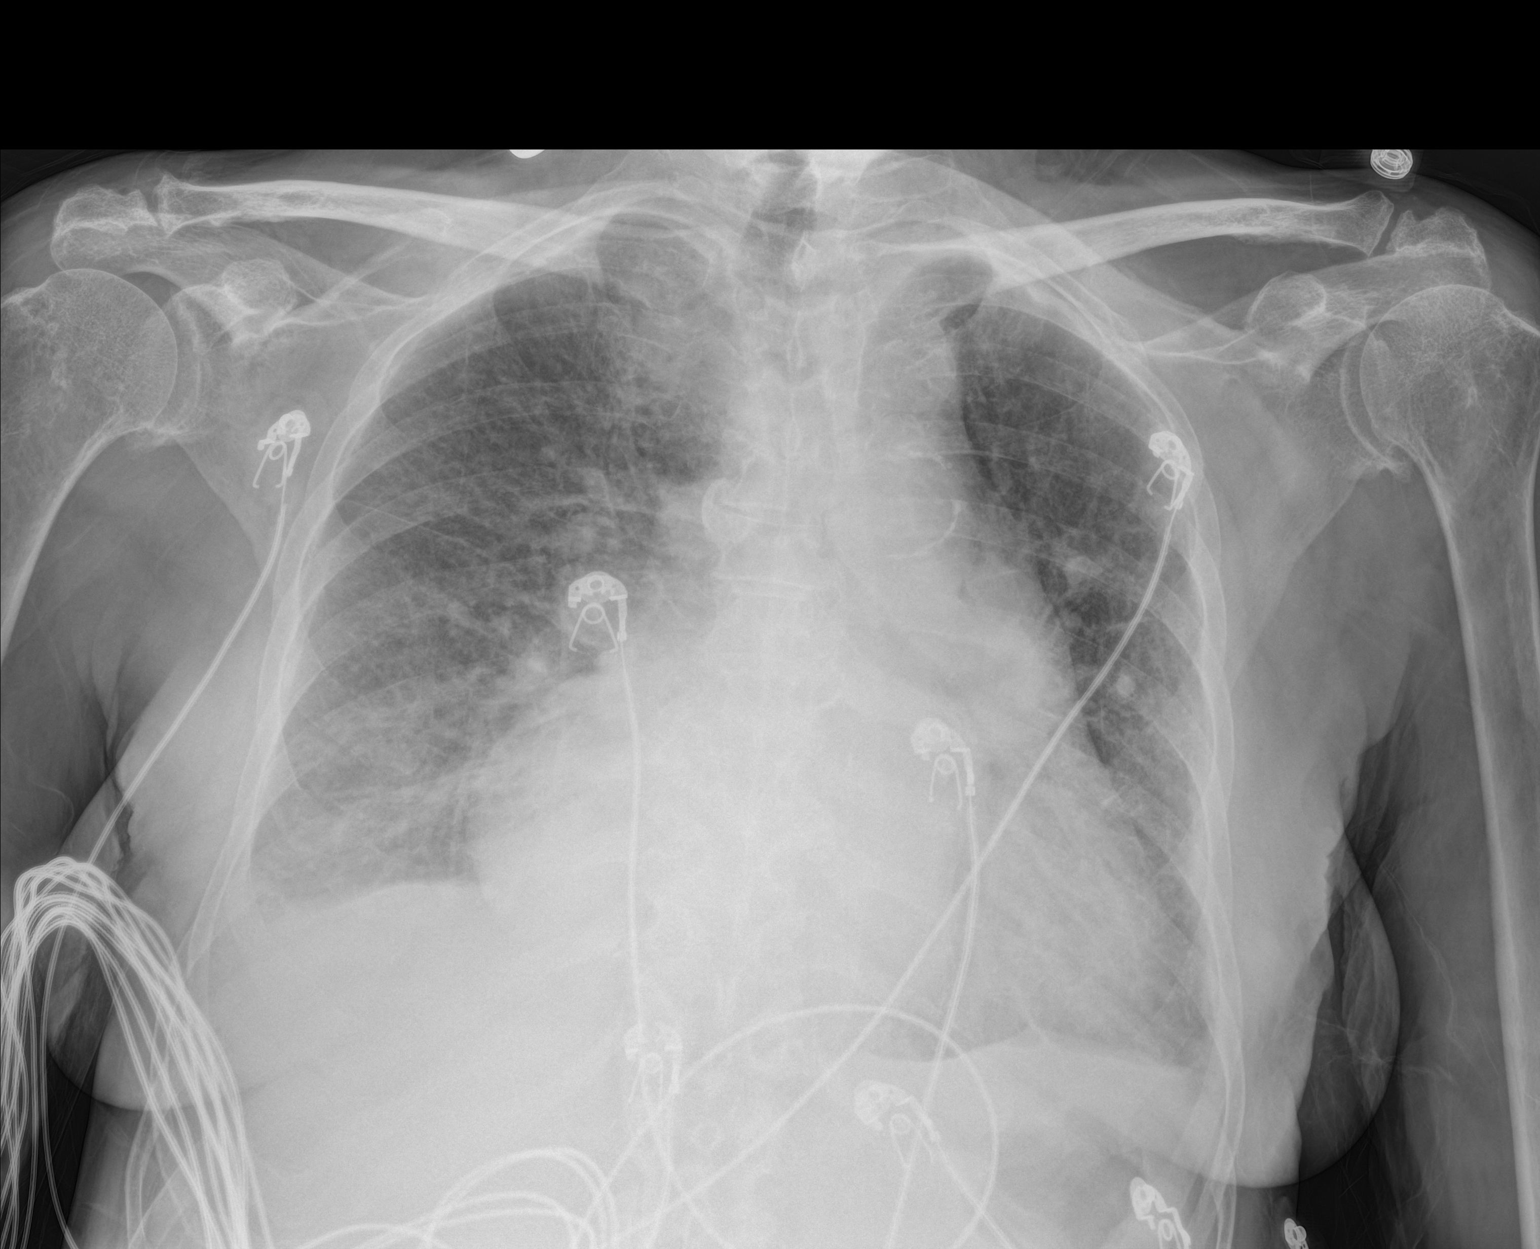

[1 of 1 positions shown; findings below may reference images not displayed]

FINDINGS: There is generalized interstitial edema. There are small pleural
effusions bilaterally. There is generalized cardiomegaly with
pulmonary venous hypertension. There is airspace consolidation in
the medial right base. There is atherosclerotic calcification in the
aorta. No adenopathy evident. There is degenerative change in the
thoracic spine and in each shoulder.
IMPRESSION: Congestive heart failure. There is airspace consolidation in the
medial right base which may represent either chronic atelectasis,
pleural fluid tracking into this area, or possible superimposed
pneumonia. Opacification in this area was present on prior study.

## 2017-11-27 IMAGING — DX DG ABD PORTABLE 1V
1 series · 1 of 1 positions shown · non-contrast
Comparison: Radiograph April 10, 2015.

CLINICAL DATA: Generalized abdominal pain.

EXAM:
PORTABLE ABDOMEN - 1 VIEW

[abdomen kub]
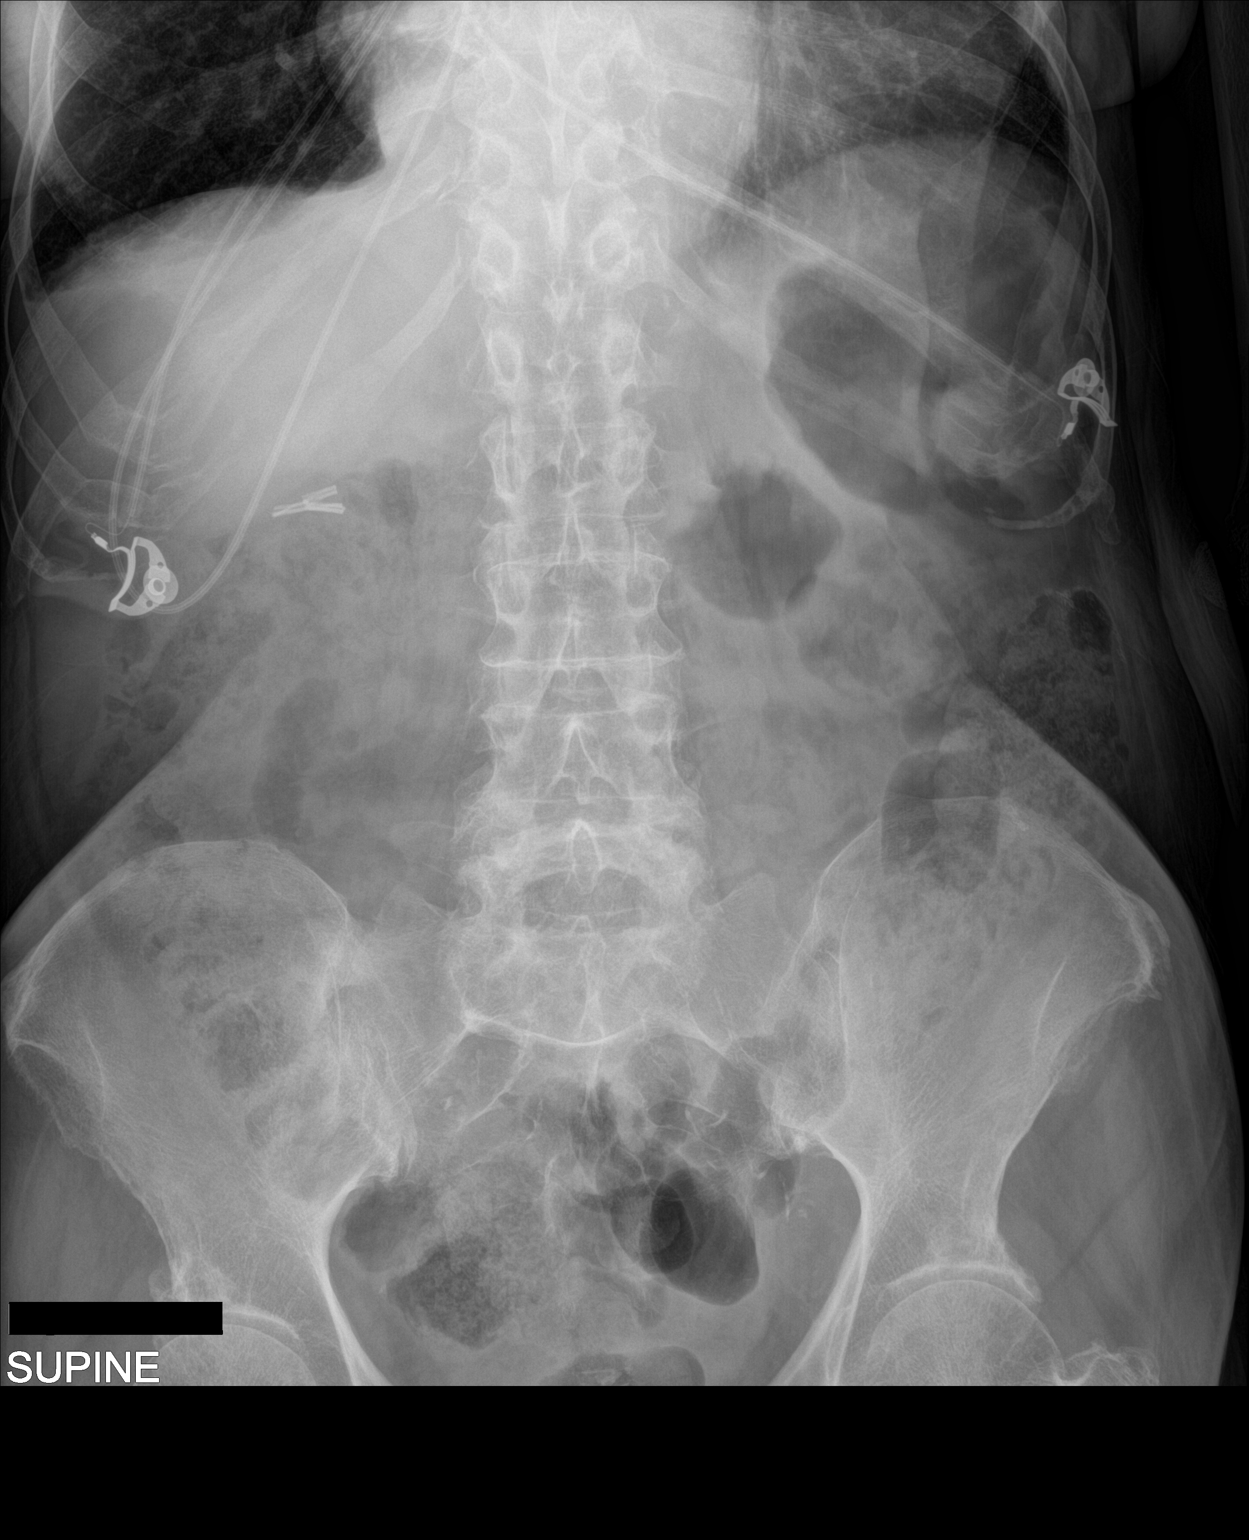

[1 of 1 positions shown; findings below may reference images not displayed]

FINDINGS: The bowel gas pattern is normal. Status post cholecystectomy. No
radio-opaque calculi or other significant radiographic abnormality
are seen.
IMPRESSION: No evidence of bowel obstruction or ileus.

## 2017-11-29 ENCOUNTER — Telehealth: Payer: Self-pay

## 2017-11-29 NOTE — Telephone Encounter (Signed)
Left message for patient's daughter, Peter Congo.

## 2017-12-02 ENCOUNTER — Other Ambulatory Visit: Payer: Self-pay | Admitting: Hospice and Palliative Medicine

## 2017-12-02 DIAGNOSIS — Z515 Encounter for palliative care: Secondary | ICD-10-CM | POA: Diagnosis not present

## 2017-12-02 NOTE — Progress Notes (Signed)
PALLIATIVE CARE CONSULT VISIT   PATIENT NAME: Nicole [name] DOB: 1931/01/18 MRN: [id number]  PRIMARY CARE PROVIDER:   [name], DO  REFERRING PROVIDER:      [name], DO [place], [place] [address]  RESPONSIBLE PARTY:   Daughter - Peter Congo   RECOMMENDATIONS and PLAN:  1. Weakness - she is limited by chronic dyspnea and is unable to ambulate long distances without becoming short of breath. However, patient is able is to stand and ambulates without assistance to the bathroom. No falls reported. She does require assistance with ADLs.  2. FTT - weight loss noted. Family feels that patient's appetite has increased over the past week. She is eating 40-50% of 2-3 meals per day. I encouraged oral supplements 1-2 times per day and discussed high protein/calorie meals.  3. GOC - We talked about disease trajectory. Both patient and family feel that she has improved at home with home health. Patient lives with her significant other and has a hired caregiver in the home 4-5 hours, five days per week. Patient's primary goal is to remain home. We talked about use of hospice and family were receptive to this idea upon discharge from home health.  4. ACP - patient says she does not wish to be resuscitated. Will need DNR order signed in the home. I spoke by phone with Peter Congo, patient's daughter, who is her 84.   I spent 45 minutes providing this consultation,  from 1300 to 1345. More than 50% of the time in this consultation was spent coordinating communication.   HISTORY OF PRESENT ILLNESS:  Nicole [name] is a 84 y.o. year old female with multiple medical problems including dCHF, moderate valvular heart dz, CKD, afib not on anticoagulation, h/o CVA, FTT, and vascular dementia, who was twice hosptialized recently with CHF after which she went to rehab before returning home. Patient has reportedly had a significant decline over the past few months with weight loss and functional  decline. She has lost more than 30lbs in past 3-4 months. Palliative Care was asked to help address goals of care.   CODE STATUS: DNR  PPS: 40% HOSPICE ELIGIBILITY/DIAGNOSIS: TBD  PAST MEDICAL HISTORY:  Past Medical History:  Diagnosis Date  . AAA (abdominal aortic aneurysm) ([place])   . Anemia   . Anxiety   . Arthritis   . Atrial fibrillation ([place])   . Cervicalgia   . Cholelithiasis   . Chronic systolic heart failure ([place])   . CKD (chronic kidney disease)   . Colon cancer ([place]) 07/1997  . Depression   . Diabetes mellitus ([place])   . Dizziness and giddiness   . Electrolyte and fluid disorders not elsewhere classified   . GERD (gastroesophageal reflux disease)   . Gout, unspecified   . Hemorrhage of rectum and anus   . Hypercholesteremia   . Hypertension   . Hypoglycemia, unspecified   . Hypopotassemia   . Hypothyroidism   . Kidney stone    renal calculi  . Other chronic allergic conjunctivitis   . Other specified cardiac dysrhythmias(427.89)   . Pain in joint, lower leg   . Perforation of bile duct   . Proteinuria   . Shortness of breath   . Small bowel obstruction due to adhesions ([place]) 05/16/2012  . Stroke ([place])   . Swelling, mass, or lump in head and neck   . Thoracic aneurysm without mention of rupture    4.6 cm Asc Aortic aneurysm, CT  07/2015    SOCIAL HX:  Social History   Tobacco Use  . Smoking status: Never Smoker  . Smokeless tobacco: Never Used  Substance Use Topics  . Alcohol use: No    ALLERGIES:  Allergies  Allergen Reactions  . Iohexol Nausea And Vomiting  . Iodinated Diagnostic Agents Nausea And Vomiting  . Z-Pak [Azithromycin] Other (See Comments)    Reaction unknown by family     PERTINENT MEDICATIONS:  Outpatient Encounter Medications as of 12/02/2017  Medication Sig  . allopurinol (ZYLOPRIM) 100 MG tablet Take 1 tablet (100 mg total) by mouth daily.  Marland Kitchen aspirin EC 81 MG EC tablet Take 1 tablet (81 mg total) by mouth daily.  Marland Kitchen diltiazem  (CARDIZEM CD) 360 MG 24 hr capsule Take 360 mg by mouth daily.  Marland Kitchen donepezil (ARICEPT) 10 MG tablet Take 1 tablet (10 mg total) by mouth at bedtime.  . ferrous sulfate 325 (65 FE) MG EC tablet Take 1 tablet (325 mg total) by mouth 2 (two) times daily with a meal.  . furosemide (LASIX) 40 MG tablet Take 1 tablet (40 mg total) by mouth daily.  . hydrALAZINE (APRESOLINE) 25 MG tablet Take 25 mg by mouth 3 (three) times daily.  Marland Kitchen ipratropium (ATROVENT) 0.02 % nebulizer solution Take 2.5 mLs (0.5 mg total) by nebulization every 6 (six) hours as needed for wheezing or shortness of breath.  . isosorbide mononitrate (IMDUR) 30 MG 24 hr tablet Take 1 tablet (30 mg total) by mouth 2 (two) times daily.  Marland Kitchen levothyroxine (SYNTHROID, LEVOTHROID) 25 MCG tablet Take 1 tablet (25 mcg total) by mouth daily before breakfast.  . metoprolol succinate (TOPROL-XL) 25 MG 24 hr tablet Take 1 tablet (25 mg total) by mouth daily.  . OXYGEN Inhale 2 L into the lungs as needed.  . potassium chloride (KLOR-CON 10) 10 MEQ tablet TAKE 2 TABLETS (20 MEQ TOTAL) BY MOUTH DAILY.  Marland Kitchen sertraline (ZOLOFT) 50 MG tablet Take 1 tablet (50 mg total) by mouth daily.   No facility-administered encounter medications on file as of 12/02/2017.     PHYSICAL EXAM:   General: NAD, frail appearing, thin Cardiovascular: irregular Pulmonary: clear ant fields, on O2 Abdomen: soft, nontender, + bowel sounds GU: no suprapubic tenderness Extremities: no edema, no joint deformities Skin: no rashes Neurological: Weakness but otherwise nonfocal  Irean Hong, NP

## 2017-12-07 DIAGNOSIS — L401 Generalized pustular psoriasis: Secondary | ICD-10-CM | POA: Diagnosis not present

## 2017-12-09 ENCOUNTER — Telehealth: Payer: Self-pay

## 2017-12-09 NOTE — Telephone Encounter (Signed)
Phone call placed to patient's home to offer to schedule a visit with Palliative Care. VM left.

## 2017-12-10 ENCOUNTER — Telehealth: Payer: Self-pay

## 2017-12-10 NOTE — Telephone Encounter (Signed)
Message left at patient's home to make them aware that visit for Monday 12/16/17 needs to be rescheduled as Palliative care office is closed.

## 2017-12-10 NOTE — Telephone Encounter (Signed)
Spoke with daughter, Peter Congo, to make her aware that visit on 12/16/17 needs to be rescheduled as Palliative Care office is closed. Peter Congo requested that patient's sitter be contacted. [name]: [phone]

## 2017-12-10 NOTE — Telephone Encounter (Signed)
Voice mail is full  

## 2017-12-11 ENCOUNTER — Other Ambulatory Visit: Payer: Self-pay | Admitting: Internal Medicine

## 2017-12-11 NOTE — Telephone Encounter (Signed)
Please advise on directions, 25mg  tid or 75 tid?

## 2017-12-11 NOTE — Telephone Encounter (Signed)
Hydralazine 75mg  po tid

## 2017-12-25 ENCOUNTER — Ambulatory Visit (INDEPENDENT_AMBULATORY_CARE_PROVIDER_SITE_OTHER): Payer: Medicare HMO | Admitting: Nurse Practitioner

## 2017-12-25 ENCOUNTER — Encounter: Payer: Self-pay | Admitting: Nurse Practitioner

## 2017-12-25 ENCOUNTER — Other Ambulatory Visit: Payer: Self-pay | Admitting: Internal Medicine

## 2017-12-25 VITALS — BP 134/78 | HR 63 | Temp 98.6°F | Ht 68.0 in | Wt 124.0 lb

## 2017-12-25 DIAGNOSIS — R69 Illness, unspecified: Secondary | ICD-10-CM | POA: Diagnosis not present

## 2017-12-25 DIAGNOSIS — F419 Anxiety disorder, unspecified: Secondary | ICD-10-CM

## 2017-12-25 DIAGNOSIS — F0150 Vascular dementia without behavioral disturbance: Secondary | ICD-10-CM | POA: Diagnosis not present

## 2017-12-25 DIAGNOSIS — I5043 Acute on chronic combined systolic (congestive) and diastolic (congestive) heart failure: Secondary | ICD-10-CM

## 2017-12-25 DIAGNOSIS — R627 Adult failure to thrive: Secondary | ICD-10-CM | POA: Diagnosis not present

## 2017-12-25 NOTE — Progress Notes (Signed)
Careteam: Patient Care Team: [name], DO as PCP - General (Geriatric Medicine) Pixie Casino, MD as PCP - Cardiology (Cardiology) Neldon Mc, MD as Consulting Physician (General Surgery) Debara Pickett Nadean Corwin, MD as Consulting Physician (Cardiology) Maia Breslow, MD as Consulting Physician (Orthopedic Surgery) Rutherford Guys, MD as Consulting Physician (Ophthalmology)  Advanced Directive information Type of Advance Directive: Living will  Allergies  Allergen Reactions  . Iohexol Nausea And Vomiting  . Iodinated Diagnostic Agents Nausea And Vomiting  . Z-Pak [Azithromycin] Other (See Comments)    Reaction unknown by family    Chief Complaint  Patient presents with  . Follow-up    Pt is being seen for a 4 week follow up on CHF. Pt reports she is still having SOB even with 2 L of continous O2.      HPI: Patient is a 84 y.o. female seen in the office today for 4 week follow up on CHF. Pt with hx of dementia, adult FTT, a fib, chf, gout. She was last seen 4 weeks ago by Dr Mariea Clonts after a hospitalization for CHF exacerbation. palliative care has been following.  She continues to lose weight, down to 124 lbs, was 129 at last OV but reports she is eating "enough" Here today with caregiver.  Home health is following her at home, nurse is following her but reports she is not able to do much "of nothing" Caregiver reports good days and bad days. Yesterday and today more short of breath, gave her breathing treatment but will not keep it on. Increase RR when  Talking about her breathing. Caregiver reports it was worse also over the weekend bc she went to a family reunion.   Discussed palliative care meeting with pt and caregiver today. Per notes she is a DNR but there is no documentation in chart to reflect this or DNR in the home.   Review of Systems:  Review of Systems  Constitutional: Positive for malaise/fatigue and weight loss. Negative for chills and fever.  HENT:  Positive for hearing loss. Negative for congestion.   Eyes: Negative for blurred vision.  Respiratory: Positive for shortness of breath (remains unchanged, a little worse when out of the house).   Cardiovascular: Positive for leg swelling. Negative for chest pain and palpitations.       Minimal edema now  Gastrointestinal: Negative for abdominal pain, blood in stool, constipation and melena.  Genitourinary: Negative for dysuria.       Incontinent, uses depends  Musculoskeletal: Negative for falls.       Reports discomfort to bottom, but no open areas   Neurological: Positive for headaches.  Psychiatric/Behavioral: Positive for memory loss. Negative for depression.    Past Medical History:  Diagnosis Date  . AAA (abdominal aortic aneurysm) ([place])   . Anemia   . Anxiety   . Arthritis   . Atrial fibrillation ([place])   . Cervicalgia   . Cholelithiasis   . Chronic systolic heart failure ([place])   . CKD (chronic kidney disease)   . Colon cancer ([place]) 07/1997  . Depression   . Diabetes mellitus ([place])   . Dizziness and giddiness   . Electrolyte and fluid disorders not elsewhere classified   . GERD (gastroesophageal reflux disease)   . Gout, unspecified   . Hemorrhage of rectum and anus   . Hypercholesteremia   . Hypertension   . Hypoglycemia, unspecified   . Hypopotassemia   . Hypothyroidism   . Kidney stone  renal calculi  . Other chronic allergic conjunctivitis   . Other specified cardiac dysrhythmias(427.89)   . Pain in joint, lower leg   . Perforation of bile duct   . Proteinuria   . Shortness of breath   . Small bowel obstruction due to adhesions ([place]) 05/16/2012  . Stroke ([place])   . Swelling, mass, or lump in head and neck   . Thoracic aneurysm without mention of rupture    4.6 cm Asc Aortic aneurysm, CT 07/2015   Past Surgical History:  Procedure Laterality Date  . CARDIAC CATHETERIZATION  2008   moderate severe pulm HTN; with elevted pulm capillary wedge pressure     . CHOLECYSTECTOMY  05/14/2012   Procedure: LAPAROSCOPIC CHOLECYSTECTOMY WITH INTRAOPERATIVE CHOLANGIOGRAM;  Surgeon: Haywood Lasso, MD;  Location: [facility];  Service: General;  Laterality: N/A;  . ERCP  05/17/2012   Procedure: ENDOSCOPIC RETROGRADE CHOLANGIOPANCREATOGRAPHY (ERCP);  Surgeon: Missy Sabins, MD;  Location: [facility];  Service: Gastroenterology;  Laterality: N/A;  . ERCP  08/12/2012   Procedure: ENDOSCOPIC RETROGRADE CHOLANGIOPANCREATOGRAPHY (ERCP);  Surgeon: Missy Sabins, MD;  Location: [facility] ENDOSCOPY;  Service: Endoscopy;  Laterality: N/A;  . HEMICOLECTOMY  08/11/1997  . KIDNEY STONE SURGERY    . TRANSTHORACIC ECHOCARDIOGRAM  11/2009   EF 45-50%, mild-mod AV regurg; mild MR; mod TR; ascending aorta mildly dilated   Social History:   reports that she has never smoked. She has never used smokeless tobacco. She reports that she does not drink alcohol or use drugs.  Family History  Problem Relation Age of Onset  . Heart disease Mother   . Stroke Father   . Hypertension Daughter   . Hypertension Son   . Diabetes Sister   . Hypertension Son     Medications: Patient's Medications  New Prescriptions   No medications on file  Previous Medications   ALLOPURINOL (ZYLOPRIM) 100 MG TABLET    Take 1 tablet (100 mg total) by mouth daily.   ASPIRIN EC 81 MG EC TABLET    Take 1 tablet (81 mg total) by mouth daily.   DILTIAZEM (CARDIZEM CD) 360 MG 24 HR CAPSULE    Take 360 mg by mouth daily.   DONEPEZIL (ARICEPT) 10 MG TABLET    Take 1 tablet (10 mg total) by mouth at bedtime.   FERROUS SULFATE 325 (65 FE) MG EC TABLET    Take 1 tablet (325 mg total) by mouth 2 (two) times daily with a meal.   FUROSEMIDE (LASIX) 40 MG TABLET    Take 1 tablet (40 mg total) by mouth daily.   HYDRALAZINE (APRESOLINE) 25 MG TABLET    TAKE 75 mg TABLETS 3 TIMES A DAY   IPRATROPIUM (ATROVENT) 0.02 % NEBULIZER SOLUTION    Take 2.5 mLs (0.5 mg total) by nebulization every 6 (six) hours as needed for wheezing or  shortness of breath.   ISOSORBIDE MONONITRATE (IMDUR) 30 MG 24 HR TABLET    Take 1 tablet (30 mg total) by mouth 2 (two) times daily.   LEVOTHYROXINE (SYNTHROID, LEVOTHROID) 25 MCG TABLET    Take 1 tablet (25 mcg total) by mouth daily before breakfast.   METOPROLOL SUCCINATE (TOPROL-XL) 25 MG 24 HR TABLET    Take 1 tablet (25 mg total) by mouth daily.   OXYGEN    Inhale 2 L into the lungs as needed.   POTASSIUM CHLORIDE (KLOR-CON 10) 10 MEQ TABLET    TAKE 2 TABLETS (20 MEQ TOTAL) BY MOUTH DAILY.  SERTRALINE (ZOLOFT) 50 MG TABLET    Take 1 tablet (50 mg total) by mouth daily.  Modified Medications   No medications on file  Discontinued Medications   No medications on file     Physical Exam:  Vitals:   12/25/17 1314  BP: 134/78  Pulse: 63  Temp: 98.6 F (37 C)  TempSrc: Oral  SpO2: 96%  Weight: 124 lb (56.2 kg)  Height: 5\' 8"  (1.727 m)   Body mass index is 18.85 kg/m.  Physical Exam  Constitutional: No distress.  Cachectic appearing female now, sitting in wheelchair, conversational dyspnea 3 L O2 with portable oxygen. also with increase RR when talking about her breathing   Cardiovascular: An irregularly irregular rhythm present.  Pulmonary/Chest: Tachypnea noted. She has decreased breath sounds. She has no rales.  Increased effort  Abdominal: Soft. Bowel sounds are normal.  Genitourinary:  Genitourinary Comments: Wears depends  Musculoskeletal: Normal range of motion. She exhibits no edema.  Neurological: She is alert.  Oriented to person, place, not time  Skin: Skin is warm and dry.  Psychiatric: She has a normal mood and affect.   Labs reviewed: Basic Metabolic Panel: Recent Labs    01/11/17 2123 01/12/17 0700  05/23/17 0517  07/25/17 1355  10/13/17 0517 10/14/17 0434 10/15/17 0801  NA  --  138   < >  --    < >  --    < > 140 139 142  K  --  3.4*   < >  --    < >  --    < > 3.1* 3.2* 3.5  CL  --  103   < >  --    < >  --    < > 94* 93* 97*  CO2  --  23   <  >  --    < >  --    < > 35* 36* 36*  GLUCOSE  --  136*   < >  --    < >  --    < > 83 58* 86  BUN  --  12   < >  --    < >  --    < > 14 15 17   CREATININE  --  1.07*   < >  --    < >  --    < > 1.18* 1.17* 1.23*  CALCIUM  --  8.7*   < >  --    < >  --    < > 7.7* 7.7* 8.0*  MG  --  1.6*  --   --   --   --   --   --   --   --   TSH 0.244*  --   --  0.240*  --  4.29  --   --   --   --    < > = values in this interval not displayed.   Liver Function Tests: No results for input(s): AST, ALT, ALKPHOS, BILITOT, PROT, ALBUMIN in the last 8760 hours. No results for input(s): LIPASE, AMYLASE in the last 8760 hours. Recent Labs    01/11/17 2123  AMMONIA 24   CBC: Recent Labs    05/23/17 0156  05/25/17 0711 06/29/17 1204 10/09/17 2020  WBC 7.5   < > 6.6 5.9 5.4  NEUTROABS 6.2  --   --  4.4 4.4  HGB 13.0   < > 11.0* 12.2 14.6  HCT 37.5   < >  32.3* 35.7* 42.1  MCV 74.0*   < > 75.1* 76.0* 79.1  PLT 231   < > 171 218 155   < > = values in this interval not displayed.   Lipid Panel: Recent Labs    10/10/17 0344  CHOL 173  HDL 37*  LDLCALC 119*  TRIG 87  CHOLHDL 4.7   TSH: Recent Labs    01/11/17 2123 05/23/17 0517 07/25/17 1355  TSH 0.244* 0.240* 4.29   A1C: Lab Results  Component Value Date   HGBA1C 5.5 10/10/2017     Assessment/Plan 1. Acute on chronic combined systolic and diastolic heart failure (HCC) -euvolemic today, will continue current regimen, following with palliative care for symptom management and goal to stay out of hospital. DNR was established but form not completed, done today and given to caregiver and pt - DNR (Do Not Resuscitate)  2. Vascular dementia without behavioral disturbance -progressive decline,  Alert and conversational today however caregiver reports hx is incorrect, she is with inconscient what she tells me today vs what she is telling them. -continues on aricept - DNR (Do Not Resuscitate)  3. Adult failure to thrive Ongoing FTT,  worsens with disease progression from dementia and CHF. Following with palliative care - DNR (Do Not Resuscitate)  Next appt: 03/03/2018 Carlos American. [place], [facility] [phone]

## 2017-12-26 DIAGNOSIS — J189 Pneumonia, unspecified organism: Secondary | ICD-10-CM | POA: Diagnosis not present

## 2017-12-26 DIAGNOSIS — I5032 Chronic diastolic (congestive) heart failure: Secondary | ICD-10-CM | POA: Diagnosis not present

## 2017-12-26 DIAGNOSIS — M6281 Muscle weakness (generalized): Secondary | ICD-10-CM | POA: Diagnosis not present

## 2017-12-26 DIAGNOSIS — I129 Hypertensive chronic kidney disease with stage 1 through stage 4 chronic kidney disease, or unspecified chronic kidney disease: Secondary | ICD-10-CM | POA: Diagnosis not present

## 2017-12-26 DIAGNOSIS — R0602 Shortness of breath: Secondary | ICD-10-CM | POA: Diagnosis not present

## 2017-12-26 DIAGNOSIS — R69 Illness, unspecified: Secondary | ICD-10-CM | POA: Diagnosis not present

## 2017-12-26 DIAGNOSIS — M109 Gout, unspecified: Secondary | ICD-10-CM | POA: Diagnosis not present

## 2018-01-03 ENCOUNTER — Other Ambulatory Visit: Payer: Self-pay | Admitting: Nurse Practitioner

## 2018-01-05 DIAGNOSIS — L401 Generalized pustular psoriasis: Secondary | ICD-10-CM | POA: Diagnosis not present

## 2018-01-12 ENCOUNTER — Other Ambulatory Visit: Payer: Self-pay | Admitting: Internal Medicine

## 2018-01-25 DIAGNOSIS — J189 Pneumonia, unspecified organism: Secondary | ICD-10-CM | POA: Diagnosis not present

## 2018-01-25 DIAGNOSIS — M109 Gout, unspecified: Secondary | ICD-10-CM | POA: Diagnosis not present

## 2018-01-25 DIAGNOSIS — R69 Illness, unspecified: Secondary | ICD-10-CM | POA: Diagnosis not present

## 2018-01-25 DIAGNOSIS — M6281 Muscle weakness (generalized): Secondary | ICD-10-CM | POA: Diagnosis not present

## 2018-01-25 DIAGNOSIS — I5032 Chronic diastolic (congestive) heart failure: Secondary | ICD-10-CM | POA: Diagnosis not present

## 2018-01-25 DIAGNOSIS — I129 Hypertensive chronic kidney disease with stage 1 through stage 4 chronic kidney disease, or unspecified chronic kidney disease: Secondary | ICD-10-CM | POA: Diagnosis not present

## 2018-01-25 DIAGNOSIS — R0602 Shortness of breath: Secondary | ICD-10-CM | POA: Diagnosis not present

## 2018-02-02 IMAGING — CT CT HEAD W/O CM
4 series · 16 of 47 positions shown, 18 images · non-contrast
Comparison: 07/28/2015

CLINICAL DATA: Hypertension, temporal headache, blurred vision

EXAM:
CT HEAD WITHOUT CONTRAST
TECHNIQUE: Contiguous axial images were obtained from the base of the skull
through the vertex without intravenous contrast.

[Series 2: head without · axial · non-contrast · 0.42mm/px · z∈[+6,+116]mm · 7 of 30 slices shown, 9 images]
[im 4/30  brain]
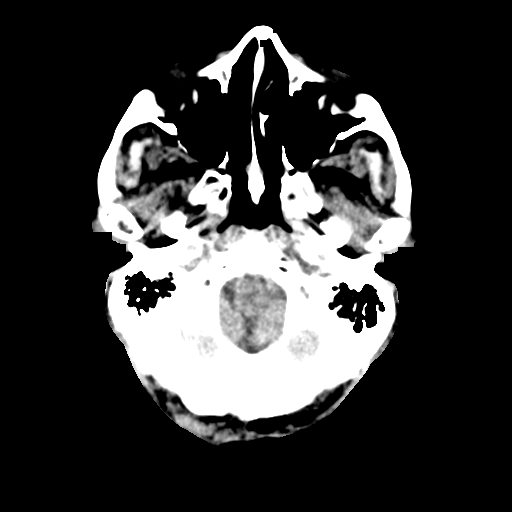
[im 4/30  bone]
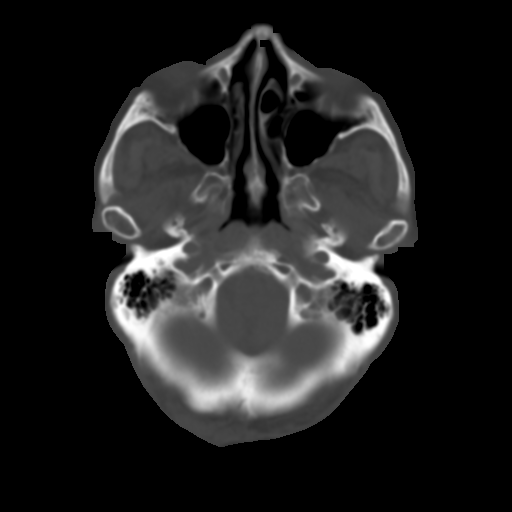
[im 8/30  brain]
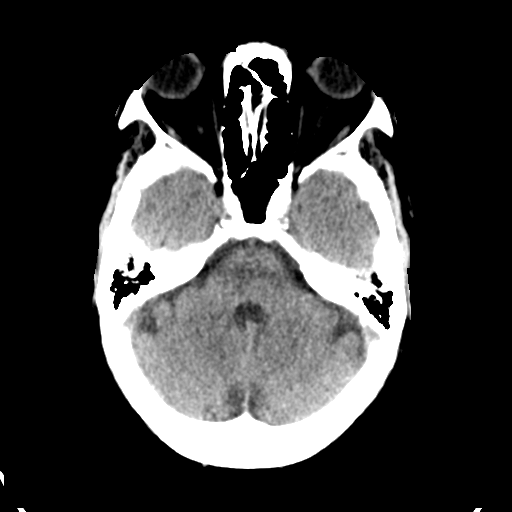
[im 11/30  brain]
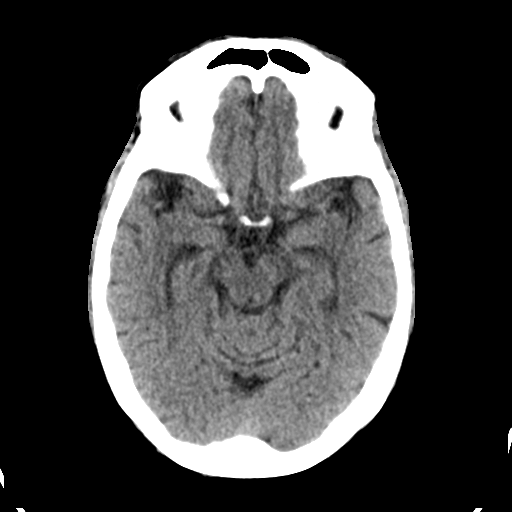
[im 15/30  brain]
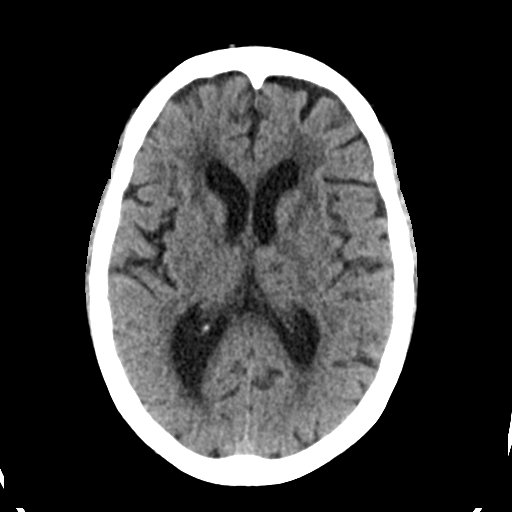
[im 19/30  brain]
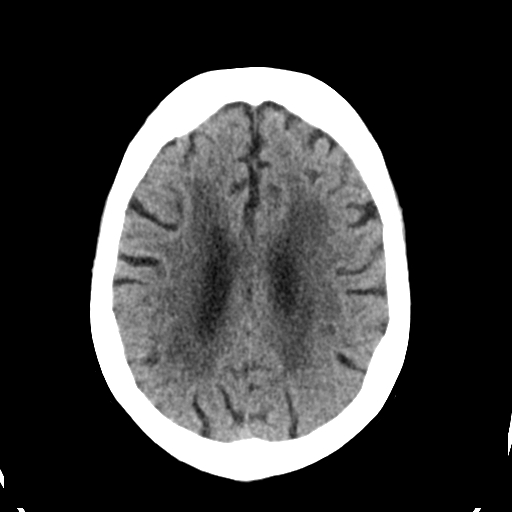
[im 19/30  bone]
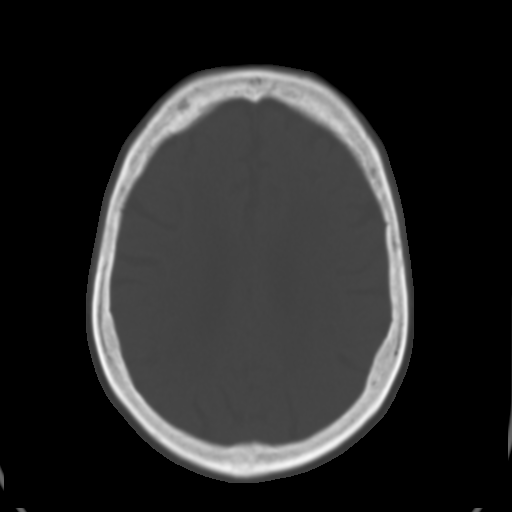
[im 22/30  brain]
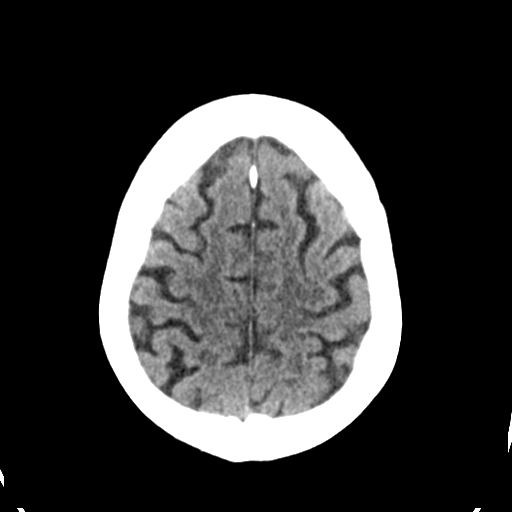
[im 26/30  brain]
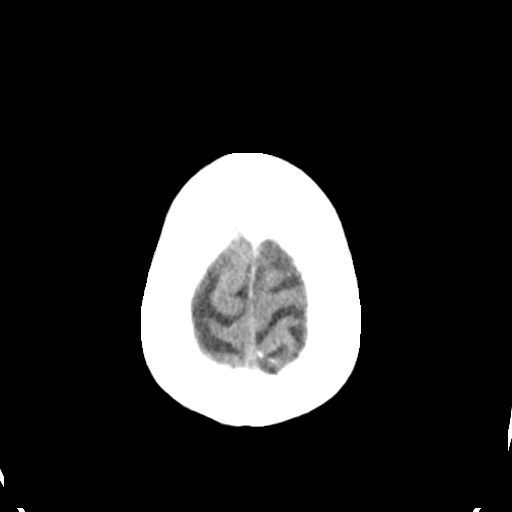

[Series 3: head bone · axial · 0.42mm/px · z∈[+5,+35]mm · 3 of 75 slices shown]
[im 8/75  bone]
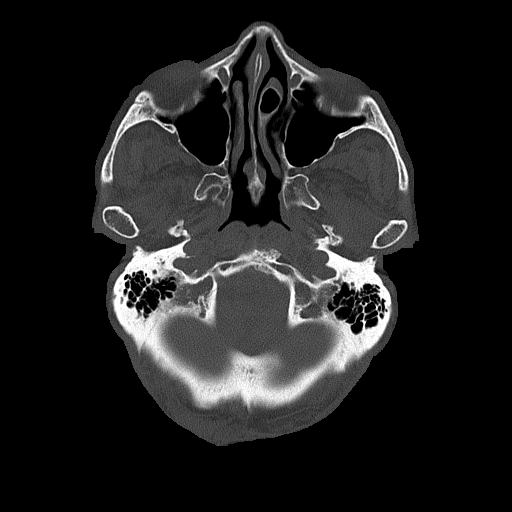
[im 15/75  bone]
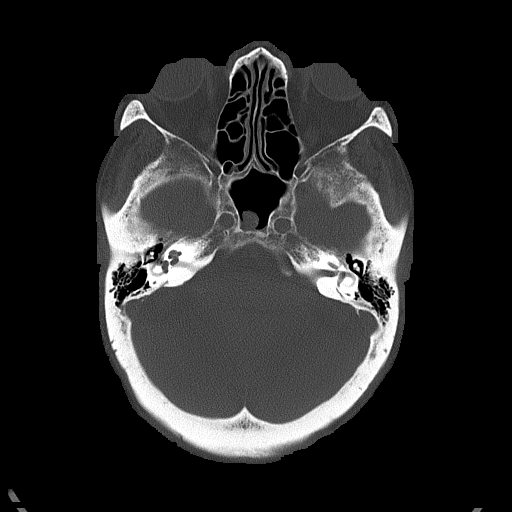
[im 23/75  bone]
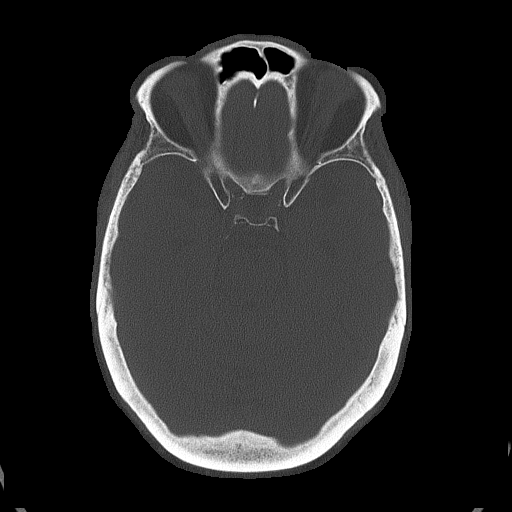

[Series 4: head without cor · coronal · non-contrast · 0.29mm/px · 3 of 68 slices shown]
[im 23/68  brain]
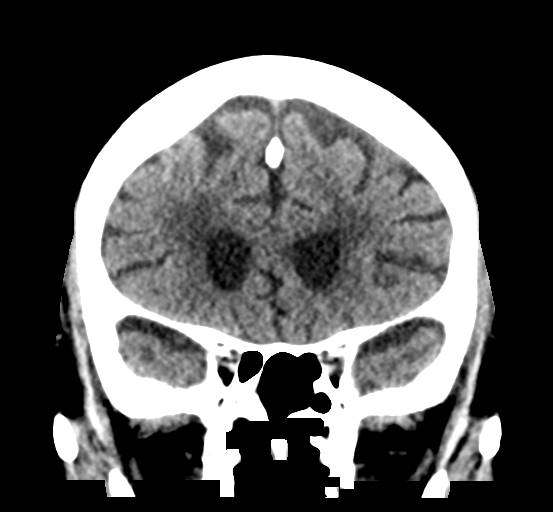
[im 30/68  brain]
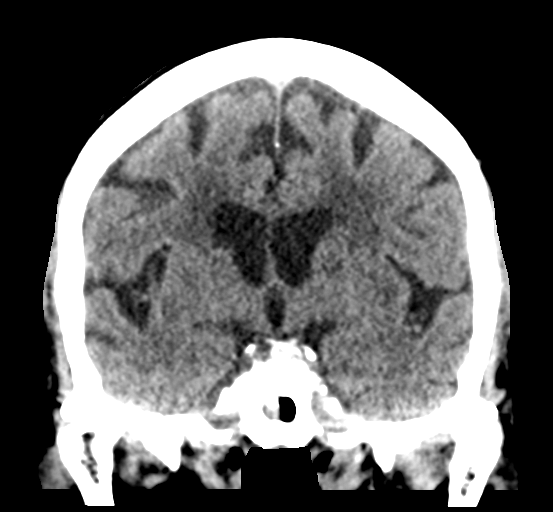
[im 38/68  brain]
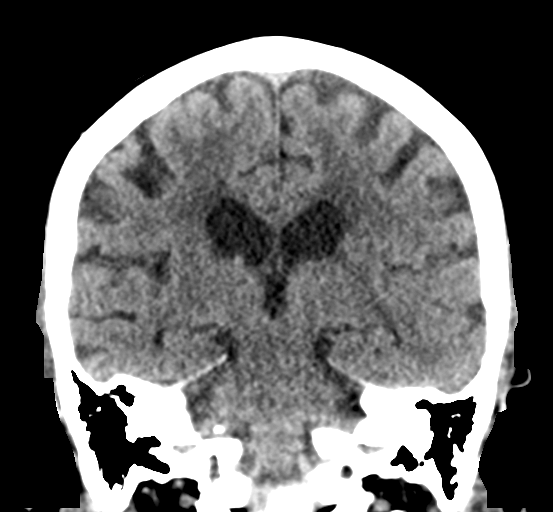

[Series 5: head without sag · sagittal · non-contrast · 0.29mm/px · 3 of 53 slices shown]
[im 18/53  brain]
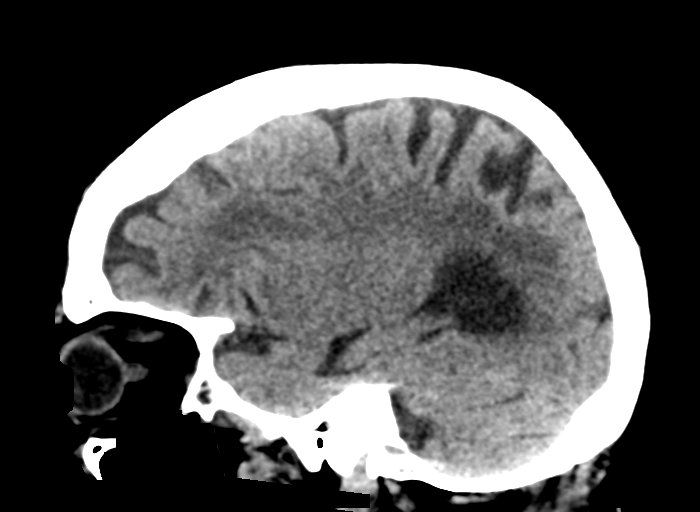
[im 27/53  brain]
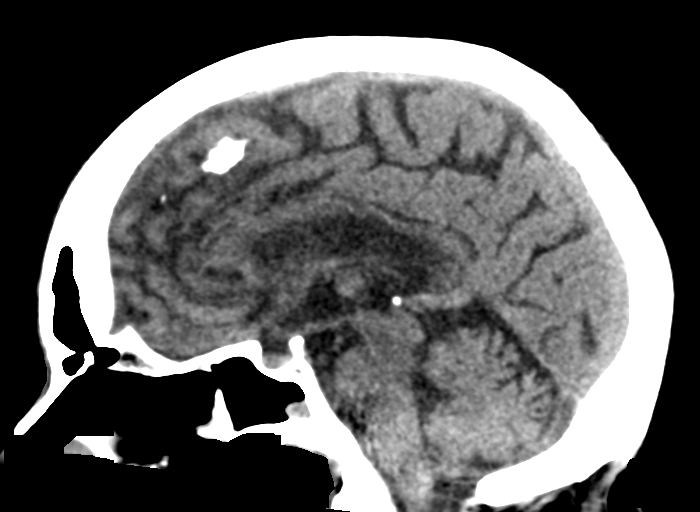
[im 35/53  brain]
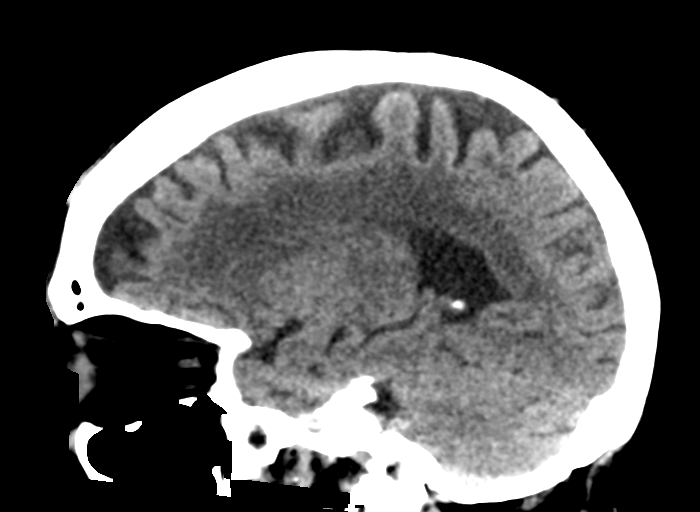

[16 of 47 positions shown; findings below may reference images not displayed]

FINDINGS: Brain: No evidence of acute infarction, hemorrhage, hydrocephalus,
extra-axial collection or mass lesion/mass effect.

Chronic left basal ganglia lacunar infarct (series 2/ image 15).

Vascular: No hyperdense vessel or unexpected calcification.

Skull: No evidence of calvarial fracture.

Sinuses/Orbits: Minimal partial opacification of the left sphenoid
sinus.

Other: Mild cortical atrophy.  No ventriculomegaly.

Subcortical white matter and periventricular small vessel ischemic
changes. Intracranial atherosclerosis.
IMPRESSION: No evidence of acute intracranial abnormality.

Chronic left basal ganglia lacunar infarct.

Atrophy with small vessel ischemic changes.

## 2018-02-09 ENCOUNTER — Emergency Department (HOSPITAL_COMMUNITY): Payer: Medicare HMO

## 2018-02-09 ENCOUNTER — Ambulatory Visit (HOSPITAL_COMMUNITY)
Admission: EM | Admit: 2018-02-09 | Discharge: 2018-02-09 | Disposition: A | Discharge status: Home / Self Care | Payer: Medicare HMO

## 2018-02-09 ENCOUNTER — Other Ambulatory Visit: Payer: Self-pay

## 2018-02-09 ENCOUNTER — Encounter (HOSPITAL_COMMUNITY): Payer: Self-pay

## 2018-02-09 ENCOUNTER — Inpatient Hospital Stay (HOSPITAL_COMMUNITY)
Admission: EM | Admit: 2018-02-09 | Discharge: 2018-02-15 | DRG: 291 | Disposition: A | Discharge status: Home Health | Payer: Medicare HMO | Attending: Internal Medicine | Admitting: Internal Medicine

## 2018-02-09 DIAGNOSIS — I482 Chronic atrial fibrillation, unspecified: Secondary | ICD-10-CM

## 2018-02-09 DIAGNOSIS — N179 Acute kidney failure, unspecified: Secondary | ICD-10-CM | POA: Diagnosis not present

## 2018-02-09 DIAGNOSIS — I4892 Unspecified atrial flutter: Secondary | ICD-10-CM | POA: Diagnosis present

## 2018-02-09 DIAGNOSIS — R0600 Dyspnea, unspecified: Secondary | ICD-10-CM | POA: Diagnosis not present

## 2018-02-09 DIAGNOSIS — J9621 Acute and chronic respiratory failure with hypoxia: Secondary | ICD-10-CM | POA: Diagnosis present

## 2018-02-09 DIAGNOSIS — I130 Hypertensive heart and chronic kidney disease with heart failure and stage 1 through stage 4 chronic kidney disease, or unspecified chronic kidney disease: Secondary | ICD-10-CM | POA: Diagnosis not present

## 2018-02-09 DIAGNOSIS — I712 Thoracic aortic aneurysm, without rupture, unspecified: Secondary | ICD-10-CM | POA: Diagnosis present

## 2018-02-09 DIAGNOSIS — M109 Gout, unspecified: Secondary | ICD-10-CM | POA: Diagnosis present

## 2018-02-09 DIAGNOSIS — I5043 Acute on chronic combined systolic (congestive) and diastolic (congestive) heart failure: Secondary | ICD-10-CM | POA: Diagnosis present

## 2018-02-09 DIAGNOSIS — Z888 Allergy status to other drugs, medicaments and biological substances status: Secondary | ICD-10-CM

## 2018-02-09 DIAGNOSIS — Z881 Allergy status to other antibiotic agents status: Secondary | ICD-10-CM

## 2018-02-09 DIAGNOSIS — I714 Abdominal aortic aneurysm, without rupture: Secondary | ICD-10-CM | POA: Diagnosis present

## 2018-02-09 DIAGNOSIS — Z91041 Radiographic dye allergy status: Secondary | ICD-10-CM

## 2018-02-09 DIAGNOSIS — I5023 Acute on chronic systolic (congestive) heart failure: Secondary | ICD-10-CM

## 2018-02-09 DIAGNOSIS — Z66 Do not resuscitate: Secondary | ICD-10-CM | POA: Diagnosis not present

## 2018-02-09 DIAGNOSIS — E1142 Type 2 diabetes mellitus with diabetic polyneuropathy: Secondary | ICD-10-CM | POA: Diagnosis not present

## 2018-02-09 DIAGNOSIS — Z833 Family history of diabetes mellitus: Secondary | ICD-10-CM

## 2018-02-09 DIAGNOSIS — Z9049 Acquired absence of other specified parts of digestive tract: Secondary | ICD-10-CM

## 2018-02-09 DIAGNOSIS — E1122 Type 2 diabetes mellitus with diabetic chronic kidney disease: Secondary | ICD-10-CM | POA: Diagnosis present

## 2018-02-09 DIAGNOSIS — K219 Gastro-esophageal reflux disease without esophagitis: Secondary | ICD-10-CM | POA: Diagnosis present

## 2018-02-09 DIAGNOSIS — R339 Retention of urine, unspecified: Secondary | ICD-10-CM | POA: Diagnosis not present

## 2018-02-09 DIAGNOSIS — Z87442 Personal history of urinary calculi: Secondary | ICD-10-CM

## 2018-02-09 DIAGNOSIS — I5033 Acute on chronic diastolic (congestive) heart failure: Secondary | ICD-10-CM | POA: Diagnosis not present

## 2018-02-09 DIAGNOSIS — Z8673 Personal history of transient ischemic attack (TIA), and cerebral infarction without residual deficits: Secondary | ICD-10-CM

## 2018-02-09 DIAGNOSIS — R0602 Shortness of breath: Secondary | ICD-10-CM

## 2018-02-09 DIAGNOSIS — Z7982 Long term (current) use of aspirin: Secondary | ICD-10-CM

## 2018-02-09 DIAGNOSIS — I110 Hypertensive heart disease with heart failure: Secondary | ICD-10-CM | POA: Diagnosis not present

## 2018-02-09 DIAGNOSIS — R627 Adult failure to thrive: Secondary | ICD-10-CM | POA: Diagnosis present

## 2018-02-09 DIAGNOSIS — E034 Atrophy of thyroid (acquired): Secondary | ICD-10-CM

## 2018-02-09 DIAGNOSIS — E039 Hypothyroidism, unspecified: Secondary | ICD-10-CM | POA: Diagnosis present

## 2018-02-09 DIAGNOSIS — J90 Pleural effusion, not elsewhere classified: Secondary | ICD-10-CM

## 2018-02-09 DIAGNOSIS — L89153 Pressure ulcer of sacral region, stage 3: Secondary | ICD-10-CM

## 2018-02-09 DIAGNOSIS — N183 Chronic kidney disease, stage 3 (moderate): Secondary | ICD-10-CM | POA: Diagnosis present

## 2018-02-09 DIAGNOSIS — Z85038 Personal history of other malignant neoplasm of large intestine: Secondary | ICD-10-CM

## 2018-02-09 DIAGNOSIS — Z681 Body mass index (BMI) 19 or less, adult: Secondary | ICD-10-CM

## 2018-02-09 DIAGNOSIS — F419 Anxiety disorder, unspecified: Secondary | ICD-10-CM | POA: Diagnosis present

## 2018-02-09 DIAGNOSIS — I272 Pulmonary hypertension, unspecified: Secondary | ICD-10-CM | POA: Diagnosis present

## 2018-02-09 DIAGNOSIS — Z823 Family history of stroke: Secondary | ICD-10-CM

## 2018-02-09 DIAGNOSIS — Z9981 Dependence on supplemental oxygen: Secondary | ICD-10-CM

## 2018-02-09 DIAGNOSIS — E876 Hypokalemia: Secondary | ICD-10-CM | POA: Diagnosis present

## 2018-02-09 DIAGNOSIS — R69 Illness, unspecified: Secondary | ICD-10-CM | POA: Diagnosis not present

## 2018-02-09 DIAGNOSIS — F05 Delirium due to known physiological condition: Secondary | ICD-10-CM | POA: Diagnosis present

## 2018-02-09 DIAGNOSIS — F0150 Vascular dementia without behavioral disturbance: Secondary | ICD-10-CM | POA: Diagnosis present

## 2018-02-09 DIAGNOSIS — E7800 Pure hypercholesterolemia, unspecified: Secondary | ICD-10-CM | POA: Diagnosis present

## 2018-02-09 DIAGNOSIS — Z8249 Family history of ischemic heart disease and other diseases of the circulatory system: Secondary | ICD-10-CM

## 2018-02-09 DIAGNOSIS — Z79890 Hormone replacement therapy (postmenopausal): Secondary | ICD-10-CM

## 2018-02-09 DIAGNOSIS — R0902 Hypoxemia: Secondary | ICD-10-CM | POA: Diagnosis present

## 2018-02-09 DIAGNOSIS — F329 Major depressive disorder, single episode, unspecified: Secondary | ICD-10-CM | POA: Diagnosis present

## 2018-02-09 DIAGNOSIS — E1121 Type 2 diabetes mellitus with diabetic nephropathy: Secondary | ICD-10-CM | POA: Diagnosis present

## 2018-02-09 LAB — BRAIN NATRIURETIC PEPTIDE: B NATRIURETIC PEPTIDE 5: 365.9 pg/mL — AB (ref 0.0–100.0)

## 2018-02-09 LAB — I-STAT TROPONIN, ED: Troponin i, poc: 0.02 ng/mL (ref 0.00–0.08)

## 2018-02-09 LAB — CBC
HEMATOCRIT: 33.6 % — AB (ref 36.0–46.0)
HEMOGLOBIN: 11.2 g/dL — AB (ref 12.0–15.0)
MCH: 27.1 pg (ref 26.0–34.0)
MCHC: 33.3 g/dL (ref 30.0–36.0)
MCV: 81.4 fL (ref 78.0–100.0)
Platelets: 200 10*3/uL (ref 150–400)
RBC: 4.13 MIL/uL (ref 3.87–5.11)
RDW: 14 % (ref 11.5–15.5)
WBC: 5 10*3/uL (ref 4.0–10.5)

## 2018-02-09 LAB — PROTIME-INR
INR: 1.13
Prothrombin Time: 14.4 seconds (ref 11.4–15.2)

## 2018-02-09 LAB — BASIC METABOLIC PANEL
Anion gap: 11 (ref 5–15)
BUN: 10 mg/dL (ref 8–23)
CO2: 30 mmol/L (ref 22–32)
Calcium: 8.7 mg/dL — ABNORMAL LOW (ref 8.9–10.3)
Chloride: 102 mmol/L (ref 98–111)
Creatinine, Ser: 1.1 mg/dL — ABNORMAL HIGH (ref 0.44–1.00)
GFR calc non Af Amer: 45 mL/min — ABNORMAL LOW (ref >60)
GFR, EST AFRICAN AMERICAN: 52 mL/min — AB (ref >60)
Glucose, Bld: 101 mg/dL — ABNORMAL HIGH (ref 70–99)
Potassium: 3.7 mmol/L (ref 3.5–5.1)
SODIUM: 143 mmol/L (ref 135–145)

## 2018-02-09 MED ORDER — FUROSEMIDE 10 MG/ML IJ SOLN: 40.0000 mg | Freq: Every day | INTRAMUSCULAR | Status: DC

## 2018-02-09 MED ORDER — GUAIFENESIN ER 600 MG PO TB12
600.0000 mg | ORAL_TABLET | Freq: Two times a day (BID) | ORAL | Status: DC
  Administered 2018-02-09 – 2018-02-15 (×11): 600 mg via ORAL
  Filled 2018-02-09 (×12): qty 1

## 2018-02-09 MED ORDER — SODIUM CHLORIDE 0.9% FLUSH
3.0000 mL | INTRAVENOUS | Status: DC | PRN
Start: 2018-02-09 — End: 2018-02-15

## 2018-02-09 MED ORDER — SERTRALINE HCL 50 MG PO TABS
50.0000 mg | ORAL_TABLET | Freq: Every day | ORAL | Status: DC
  Administered 2018-02-09 – 2018-02-15 (×7): 50 mg via ORAL
  Filled 2018-02-09 (×8): qty 1

## 2018-02-09 MED ORDER — ONDANSETRON HCL 4 MG/2ML IJ SOLN: 4.0000 mg | Freq: Four times a day (QID) | INTRAMUSCULAR | Status: DC | PRN

## 2018-02-09 MED ORDER — DONEPEZIL HCL 10 MG PO TABS
10.0000 mg | ORAL_TABLET | Freq: Every day | ORAL | Status: DC
  Administered 2018-02-09 – 2018-02-14 (×6): 10 mg via ORAL
  Filled 2018-02-09 (×6): qty 1

## 2018-02-09 MED ORDER — BISACODYL 5 MG PO TBEC: 5.0000 mg | DELAYED_RELEASE_TABLET | Freq: Every day | ORAL | Status: DC | PRN

## 2018-02-09 MED ORDER — POTASSIUM CHLORIDE CRYS ER 10 MEQ PO TBCR
20.0000 meq | EXTENDED_RELEASE_TABLET | Freq: Every day | ORAL | Status: DC
  Administered 2018-02-10 – 2018-02-15 (×6): 20 meq via ORAL
  Filled 2018-02-09 (×9): qty 2

## 2018-02-09 MED ORDER — ENSURE ENLIVE PO LIQD
237.0000 mL | Freq: Two times a day (BID) | ORAL | Status: DC
  Administered 2018-02-09 – 2018-02-15 (×9): 237 mL via ORAL

## 2018-02-09 MED ORDER — LEVOTHYROXINE SODIUM 25 MCG PO TABS
25.0000 ug | ORAL_TABLET | Freq: Every day | ORAL | Status: DC
  Administered 2018-02-10 – 2018-02-15 (×6): 25 ug via ORAL
  Filled 2018-02-09 (×7): qty 1

## 2018-02-09 MED ORDER — DILTIAZEM HCL ER COATED BEADS 180 MG PO CP24
360.0000 mg | ORAL_CAPSULE | Freq: Every day | ORAL | Status: DC
  Administered 2018-02-09: 360 mg via ORAL
  Filled 2018-02-09 (×2): qty 2

## 2018-02-09 MED ORDER — METOPROLOL TARTRATE 5 MG/5ML IV SOLN
5.0000 mg | Freq: Once | INTRAVENOUS | Status: DC
  Filled 2018-02-09: qty 5

## 2018-02-09 MED ORDER — ACETAMINOPHEN ER 650 MG PO TBCR: 650.0000 mg | EXTENDED_RELEASE_TABLET | Freq: Three times a day (TID) | ORAL | Status: DC | PRN

## 2018-02-09 MED ORDER — IPRATROPIUM BROMIDE 0.02 % IN SOLN: 0.5000 mg | Freq: Four times a day (QID) | RESPIRATORY_TRACT | Status: DC | PRN

## 2018-02-09 MED ORDER — FERROUS SULFATE 325 (65 FE) MG PO TABS
325.0000 mg | ORAL_TABLET | Freq: Two times a day (BID) | ORAL | Status: DC
  Administered 2018-02-09 – 2018-02-15 (×10): 325 mg via ORAL
  Filled 2018-02-09 (×11): qty 1

## 2018-02-09 MED ORDER — ISOSORBIDE MONONITRATE ER 30 MG PO TB24
30.0000 mg | ORAL_TABLET | Freq: Two times a day (BID) | ORAL | Status: DC
  Administered 2018-02-09 – 2018-02-15 (×10): 30 mg via ORAL
  Filled 2018-02-09 (×11): qty 1

## 2018-02-09 MED ORDER — DILTIAZEM HCL ER COATED BEADS 360 MG PO CP24
360.0000 mg | ORAL_CAPSULE | Freq: Once | ORAL | Status: AC
  Administered 2018-02-09: 360 mg via ORAL
  Filled 2018-02-09: qty 1

## 2018-02-09 MED ORDER — SODIUM CHLORIDE 0.9% FLUSH
3.0000 mL | Freq: Two times a day (BID) | INTRAVENOUS | Status: DC
  Administered 2018-02-09: 10 mL via INTRAVENOUS
  Administered 2018-02-10 – 2018-02-14 (×8): 3 mL via INTRAVENOUS

## 2018-02-09 MED ORDER — ONDANSETRON HCL 4 MG PO TABS: 4.0000 mg | ORAL_TABLET | Freq: Four times a day (QID) | ORAL | Status: DC | PRN

## 2018-02-09 MED ORDER — ALLOPURINOL 100 MG PO TABS
100.0000 mg | ORAL_TABLET | Freq: Every day | ORAL | Status: DC
  Administered 2018-02-09 – 2018-02-15 (×7): 100 mg via ORAL
  Filled 2018-02-09 (×8): qty 1

## 2018-02-09 MED ORDER — HYDRALAZINE HCL 25 MG PO TABS
25.0000 mg | ORAL_TABLET | Freq: Once | ORAL | Status: AC
  Administered 2018-02-09: 25 mg via ORAL
  Filled 2018-02-09: qty 1

## 2018-02-09 MED ORDER — METOPROLOL SUCCINATE ER 25 MG PO TB24
25.0000 mg | ORAL_TABLET | Freq: Every day | ORAL | Status: DC
  Administered 2018-02-09: 25 mg via ORAL
  Filled 2018-02-09: qty 1

## 2018-02-09 MED ORDER — FUROSEMIDE 10 MG/ML IJ SOLN: 40.0000 mg | Freq: Once | INTRAMUSCULAR | Status: DC

## 2018-02-09 MED ORDER — ACETAMINOPHEN 325 MG PO TABS
650.0000 mg | ORAL_TABLET | Freq: Four times a day (QID) | ORAL | Status: DC | PRN
  Administered 2018-02-10: 650 mg via ORAL
  Filled 2018-02-09: qty 2

## 2018-02-09 MED ORDER — HYDRALAZINE HCL 25 MG PO TABS
75.0000 mg | ORAL_TABLET | Freq: Three times a day (TID) | ORAL | Status: DC
  Administered 2018-02-09: 75 mg via ORAL
  Filled 2018-02-09: qty 3

## 2018-02-09 MED ORDER — POTASSIUM CHLORIDE CRYS ER 20 MEQ PO TBCR
20.0000 meq | EXTENDED_RELEASE_TABLET | Freq: Once | ORAL | Status: AC
  Administered 2018-02-09: 20 meq via ORAL
  Filled 2018-02-09: qty 1

## 2018-02-09 MED ORDER — SODIUM CHLORIDE 0.9 % IV SOLN: 250.0000 mL | INTRAVENOUS | Status: DC | PRN

## 2018-02-09 MED ORDER — FUROSEMIDE 10 MG/ML IJ SOLN
40.0000 mg | Freq: Once | INTRAMUSCULAR | Status: AC
  Administered 2018-02-09: 40 mg via INTRAVENOUS
  Filled 2018-02-09: qty 4

## 2018-02-09 MED ORDER — ASPIRIN EC 81 MG PO TBEC
81.0000 mg | DELAYED_RELEASE_TABLET | Freq: Every day | ORAL | Status: DC
  Administered 2018-02-09 – 2018-02-15 (×6): 81 mg via ORAL
  Filled 2018-02-09 (×7): qty 1

## 2018-02-09 NOTE — ED Triage Notes (Signed)
Pt states she has had increased shortness of breath X5 days. Pt uses O2 at home, 2L at baseline, requiring 3. Pursed lip breathing noted. She also reports some dizziness and chest pain.

## 2018-02-09 NOTE — ED Notes (Addendum)
Pt presented to the front desk feeling generally weak and dizziness that started this morning. Patient denies LOC but does feel as if she might faint x 1 day. Pt is in no apparent distress. Pt referred to the ER for further evaluation. Offered to take the patient to the ER myself. Refused, states family member will take them.

## 2018-02-09 NOTE — ED Provider Notes (Signed)
[facility]ERG[facility] DEPARTMENT Provider Note   CSN: [id number] Arrival date & time: 02/09/18  1145     History   Chief Complaint Chief Complaint  Patient presents with  . Shortness of Breath    HPI Nicole [name] is a 84 y.o. female.  HPI Patient reports that she has been more short of breath for 4 days.  She reports also she has had waxing and waning chest tightness.  She reports these are symptoms that she experiences on a more chronic basis but these past 4 days have been worse.  She reports that she came in today because he just finally got tired of it.  She is on 2 L of home oxygen.  She reports she has felt lightheaded and dizzy as though she might pass out.  She denies swelling or pain in the legs.  She denies fever.  She reports she has had some amount of increased coughing over the past few days.  Nonproductive.  She reports she is compliant with her medications but she does not know what they are.  She reports she has been taking some pills to try to help her symptoms but I cannot clarify whether or not these are her regular medications, if she has had a dose increase or has some as needed medications.  This will require clarification.  Patient does not endorse that she is on a "water pill". Past Medical History:  Diagnosis Date  . AAA (abdominal aortic aneurysm) ([place])   . Anemia   . Anxiety   . Arthritis   . Atrial fibrillation ([place])   . Cervicalgia   . Cholelithiasis   . Chronic systolic heart failure ([place])   . CKD (chronic kidney disease)   . Colon cancer ([place]) 07/1997  . Depression   . Diabetes mellitus ([place])   . Dizziness and giddiness   . Electrolyte and fluid disorders not elsewhere classified   . GERD (gastroesophageal reflux disease)   . Gout, unspecified   . Hemorrhage of rectum and anus   . Hypercholesteremia   . Hypertension   . Hypoglycemia, unspecified   . Hypopotassemia   . Hypothyroidism   . Kidney stone    renal calculi    . Other chronic allergic conjunctivitis   . Other specified cardiac dysrhythmias(427.89)   . Pain in joint, lower leg   . Perforation of bile duct   . Proteinuria   . Shortness of breath   . Small bowel obstruction due to adhesions ([place]) 05/16/2012  . Stroke ([place])   . Swelling, mass, or lump in head and neck   . Thoracic aneurysm without mention of rupture    4.6 cm Asc Aortic aneurysm, CT 07/2015    Patient Active Problem List   Diagnosis Date Noted  . Atrial flutter ([place]) 10/28/2017  . Pressure injury of skin 10/11/2017  . Hyperkalemia 10/10/2017  . Dementia 10/10/2017  . CAD (coronary artery disease) 10/10/2017  . Elevated troponin 10/10/2017  . Stroke ([place])   . Shortness of breath   . Proteinuria   . Perforation of bile duct   . Pain in joint, lower leg   . Other chronic allergic conjunctivitis   . Kidney stone   . Hypothyroidism   . Hypopotassemia   . Hypoglycemia, unspecified   . Hypertension   . Hypercholesteremia   . Hemorrhage of rectum and anus   . Gout, unspecified   . GERD (gastroesophageal reflux disease)   . Dizziness and giddiness   .  Diabetes mellitus ([place])   . Depression   . CKD (chronic kidney disease)   . Chronic systolic heart failure ([place])   . Cholelithiasis   . Cervicalgia   . Arthritis   . Anxiety   . AAA (abdominal aortic aneurysm) ([place])   . Moderate protein-calorie malnutrition ([place]) 07/11/2017  . Major depression 05/28/2017  . Adult failure to thrive 05/28/2017  . Acute encephalopathy 05/23/2017  . Acute lower UTI 05/23/2017  . Palliative care by specialist   . DNR (do not resuscitate)   . Bradyarrhythmia 01/11/2017  . Generalized weakness 01/11/2017  . General weakness 01/11/2017  . PE (pulmonary thromboembolism) ([place]) 08/24/2016  . Accelerated hypertension with diastolic congestive heart failure, NYHA class 3 ([place]) 01/05/2016  . Permanent atrial fibrillation ([place]) 01/05/2016  . Rib pain on right side 10/14/2015  .  Hypothyroidism due to acquired atrophy of thyroid 10/14/2015  . Type 2 diabetes mellitus with diabetic nephropathy, without long-term current use of insulin ([place]) 10/14/2015  . Essential hypertension 10/14/2015  . Acute on chronic systolic heart failure ([place]) 08/02/2015  . Acute diastolic CHF (congestive heart failure) ([place]) 08/02/2015  . Bradycardia 07/27/2015  . Chronic anemia 07/27/2015  . Chest pain   . Vascular dementia 06/13/2015  . Memory loss 06/13/2015  . Left hemiparesis ([place])   . Chronic diastolic heart failure ([place])   . Type 2 diabetes mellitus with peripheral neuropathy (HCC)   . ICH (intracerebral hemorrhage) ([place]) 04/04/2015  . Atrial fibrillation, chronic ([place])   . Hypertensive urgency   . SOB (shortness of breath) 04/03/2014  . Diabetic nephropathy ([place]) 09/07/2013  . Chronic constipation 03/25/2013  . Carpal tunnel syndrome 02/25/2013  . Hypokalemia 05/17/2012  . Small bowel obstruction due to adhesions ([place]) 05/16/2012  . Normal coronary arteries, 2008 05/15/2012  . Type II or unspecified type diabetes mellitus with neurological manifestations, not stated as uncontrolled(250.60) 05/15/2012  . Acute respiratory distress 05/15/2012  . Abdominal pain, acute, right upper quadrant 05/12/2012  . Cholecystitis chronic, acute, S/P lap cholecystectomy 05/14/12 05/12/2012  . Thoracic aortic aneurysm without rupture ([place])   . CHF, past NICM with an EF of 30%, most recent echo 2011 showed EF to be45- 50%   . Anemia 08/09/2011  . History of colon cancer, stage III 08/09/2011  . Colon cancer ([place]) 07/23/1997    Past Surgical History:  Procedure Laterality Date  . CARDIAC CATHETERIZATION  2008   moderate severe pulm HTN; with elevted pulm capillary wedge pressure   . CHOLECYSTECTOMY  05/14/2012   Procedure: LAPAROSCOPIC CHOLECYSTECTOMY WITH INTRAOPERATIVE CHOLANGIOGRAM;  Surgeon: Haywood Lasso, MD;  Location: [facility];  Service: General;  Laterality: N/A;  . ERCP   05/17/2012   Procedure: ENDOSCOPIC RETROGRADE CHOLANGIOPANCREATOGRAPHY (ERCP);  Surgeon: Missy Sabins, MD;  Location: [facility];  Service: Gastroenterology;  Laterality: N/A;  . ERCP  08/12/2012   Procedure: ENDOSCOPIC RETROGRADE CHOLANGIOPANCREATOGRAPHY (ERCP);  Surgeon: Missy Sabins, MD;  Location[facility] ENDOSCOPY;  Service: Endoscopy;  Laterality: N/A;  . HEMICOLECTOMY  08/11/1997  . KIDNEY STONE SURGERY    . TRANSTHORACIC ECHOCARDIOGRAM  11/2009   EF 45-50%, mild-mod AV regurg; mild MR; mod TR; ascending aorta mildly dilated     OB History   None      Home Medications    Prior to Admission medications   Medication Sig Start Date End Date Taking? Authorizing Provider  acetaminophen (TYLENOL) 650 MG CR tablet Take 650 mg by mouth every 8 (eight) hours as needed for pain.  Yes [provider]  allopurinol (ZYLOPRIM) 100 MG tablet Take 1 tablet (100 mg total) by mouth daily. 08/21/17  Yes [name], [name], DO  aspirin EC 81 MG EC tablet Take 1 tablet (81 mg total) by mouth daily. 10/17/17  Yes Danford, Suann Larry, MD  diltiazem (CARDIZEM CD) 360 MG 24 hr capsule Take 360 mg by mouth daily.   Yes [provider]  donepezil (ARICEPT) 10 MG tablet Take 1 tablet (10 mg total) by mouth at bedtime. 10/16/17  Yes Danford, Suann Larry, MD  ferrous sulfate 325 (65 FE) MG EC tablet TAKE 1 TABLET (325 MG TOTAL) BY MOUTH 2 (TWO) TIMES DAILY WITH A MEAL. 01/06/18  Yes [name], [name], DO  furosemide (LASIX) 40 MG tablet Take 1 tablet (40 mg total) by mouth daily. 08/08/17  Yes Lauree Chandler, NP  hydrALAZINE (APRESOLINE) 25 MG tablet TAKE 3 TABLETS (75 MG) TABLETS 3 TIMES A DAY Patient taking differently: Take 75 mg by mouth 3 (three) times daily.  01/13/18  Yes [name], [name], DO  Ibuprofen 200 MG CAPS Take 200 mg by mouth every 8 (eight) hours as needed for mild pain.   Yes [provider]  ipratropium (ATROVENT) 0.02 % nebulizer solution Take 2.5 mLs (0.5 mg total) by  nebulization every 6 (six) hours as needed for wheezing or shortness of breath. 11/21/17  Yes [name], [name], DO  isosorbide mononitrate (IMDUR) 30 MG 24 hr tablet Take 1 tablet (30 mg total) by mouth 2 (two) times daily. 08/08/17  Yes Lauree Chandler, NP  levothyroxine (SYNTHROID, LEVOTHROID) 25 MCG tablet Take 1 tablet (25 mcg total) by mouth daily before breakfast. 11/15/17  Yes Gildardo Cranker, DO  metoprolol succinate (TOPROL-XL) 25 MG 24 hr tablet Take 1 tablet (25 mg total) by mouth daily. 09/27/17  Yes [name], [name], DO  OVER THE COUNTER MEDICATION Apply 1 application topically daily as needed (for back pain).   Yes [provider]  potassium chloride (KLOR-CON 10) 10 MEQ tablet TAKE 2 TABLETS (20 MEQ TOTAL) BY MOUTH DAILY. 11/20/17  Yes [name], [name], DO  sertraline (ZOLOFT) 50 MG tablet TAKE 1 TABLET BY MOUTH EVERY DAY Patient taking differently: TAKE 1 TABLET (50 MG TOTALLY) BY MOUTH EVERY DAY 12/26/17  Yes [name], [name], DO  OXYGEN Inhale 2 L into the lungs as needed.    [provider]    Family History Family History  Problem Relation Age of Onset  . Heart disease Mother   . Stroke Father   . Hypertension Daughter   . Hypertension Son   . Diabetes Sister   . Hypertension Son     Social History Social History   Tobacco Use  . Smoking status: Never Smoker  . Smokeless tobacco: Never Used  Substance Use Topics  . Alcohol use: No  . Drug use: No     Allergies   Iohexol; Iodinated diagnostic agents; and Z-pak [azithromycin]   Review of Systems Review of Systems  10 Systems reviewed and are negative for acute change except as noted in the HPI.  Physical Exam Updated Vital Signs BP (!) 152/88   Pulse 97   Temp 97.9 F (36.6 C) (Oral)   Resp 18   SpO2 100%   Physical Exam  Constitutional:  Patient is alert and interactive.  Mild to moderate increased work of breathing.  Nontoxic.  HENT:  Head: Normocephalic and atraumatic.  Mouth/Throat:  Oropharynx is clear and moist.  Eyes:  EOM are normal.  Neck: Neck supple.  Cardiovascular:  Underlying tachycardia.  Irregularly irregular.  No gross murmur  Pulmonary/Chest:  Moderate increased work of breathing.  Lungs are grossly clear without wheeze or rhonchi but decreased breath sounds right base.  Abdominal: Soft. She exhibits no distension. There is no tenderness. There is no guarding.  Musculoskeletal: Normal range of motion. She exhibits no edema or tenderness.  Patient has no peripheral edema.  Calves are soft nontender.  Skin condition excellent.  She is wearing compression hose which were removed.  Neurological: She is alert. No cranial nerve deficit. She exhibits normal muscle tone. Coordination normal.  Skin: Skin is warm and dry.  Psychiatric: She has a normal mood and affect.     ED Treatments / Results  Labs (all labs ordered are listed, but only abnormal results are displayed) Labs Reviewed  BASIC METABOLIC PANEL - Abnormal; Notable for the following components:      Result Value   Glucose, Bld 101 (*)    Creatinine, Ser 1.10 (*)    Calcium 8.7 (*)    GFR calc non Af Amer 45 (*)    GFR calc Af Amer 52 (*)    All other components within normal limits  CBC - Abnormal; Notable for the following components:   Hemoglobin 11.2 (*)    HCT 33.6 (*)    All other components within normal limits  BRAIN NATRIURETIC PEPTIDE - Abnormal; Notable for the following components:   B Natriuretic Peptide 365.9 (*)    All other components within normal limits  PROTIME-INR  I-STAT TROPONIN, ED    EKG EKG Interpretation  Date/Time:  Sunday February 09 2018 11:55:32 EDT Ventricular Rate:  101 PR Interval:    QRS Duration: 102 QT Interval:  394 QTC Calculation: 510 R Axis:   70 Text Interpretation:  Atrial flutter with variable A-V block with premature ventricular or aberrantly conducted complexes Incomplete right bundle branch block Anterior infarct , age undetermined ST & T  wave abnormality, consider inferolateral ischemia Abnormal ECG no sig change from previous Confirmed by Charlesetta Shanks [phone]) on 02/09/2018 12:04:17 PM Also confirmed by Charlesetta Shanks [phone]), editor [place], Jeannetta Nap [phone])  on 02/09/2018 12:50:25 PM   Radiology Dg Chest 2 View  Result Date: 02/09/2018 CLINICAL DATA:  Shortness of breath EXAM: CHEST - 2 VIEW COMPARISON:  09/11/2017 FINDINGS: Stable cardiomegaly with vascular congestion and mild to moderate pleural effusions. Associated bibasilar atelectasis/partial consolidation. Upper lungs remain clear. No pneumothorax. Trachea is midline. Aorta is atherosclerotic. Degenerative changes of the spine with diffuse osteopenia. IMPRESSION: Cardiomegaly with vascular congestion and pleural effusions. Bibasilar compressive atelectasis/partial consolidation. Electronically Signed   By: Jerilynn Mages.  Shick M.D.   On: 02/09/2018 13:13   Ct Chest Wo Contrast  Result Date: 02/09/2018 CLINICAL DATA:  Shortness of breath EXAM: CT CHEST WITHOUT CONTRAST TECHNIQUE: Multidetector CT imaging of the chest was performed following the standard protocol without IV contrast. COMPARISON:  CTA chest 01/11/2017 FINDINGS: Cardiovascular: Severe cardiomegaly. There are coronary artery calcifications and moderate aortic atherosclerosis. The main pulmonary artery is enlarged, measuring 4.5 cm at the bifurcation. Unchanged 4.8 cm ascending thoracic aortic aneurysm. Mediastinum/Nodes: Visualized thyroid is normal. There is no mediastinal or axillary lymphadenopathy. Lungs/Pleura: Large right and small left pleural effusions with associated atelectasis. There is at least 50% collapse of the right lower lobe. Pleural effusions are larger than on the prior CT of 01/11/2017. There is otherwise no consolidation. No pulmonary edema. Upper Abdomen: The unenhanced appearance of  the visible portions of the upper abdominal organs is normal. Musculoskeletal: No acute abnormality or bony spinal canal  stenosis. IMPRESSION: 1. Large right and small left pleural effusions with associated atelectasis. No other airspace disease. 2. Enlarged main pulmonary artery, consistent with pulmonary hypertension. 3. Unchanged 4.8 cm ascending thoracic aortic aneurysm. Recommend semi-annual imaging followup by CTA or MRA and referral to cardiothoracic surgery if not already obtained. 4. Cardiomegaly with coronary artery and aortic atherosclerosis (ICD10-I70.0). The above recommendation follows 2010 ACCF/AHA/AATS/ACR/ASA/SCA/SCAI/SIR/STS/SVM Guidelines for the Diagnosis and Management of Patients With Thoracic Aortic Disease. Circulation. 2010; 121: N462-V035 Electronically Signed   By: Ulyses Jarred M.D.   On: 02/09/2018 16:40    Procedures Procedures (including critical care time) CRITICAL CARE Performed by: Charlesetta Shanks   Total critical care time: 30 minutes  Critical care time was exclusive of separately billable procedures and treating other patients.  Critical care was necessary to treat or prevent imminent or life-threatening deterioration.  Critical care was time spent personally by me on the following activities: development of treatment plan with patient and/or surrogate as well as nursing, discussions with consultants, evaluation of patient's response to treatment, examination of patient, obtaining history from patient or surrogate, ordering and performing treatments and interventions, ordering and review of laboratory studies, ordering and review of radiographic studies, pulse oximetry and re-evaluation of patient's condition. Medications Ordered in ED Medications  furosemide (LASIX) injection 40 mg (has no administration in time range)  diltiazem (CARDIZEM CD) 24 hr capsule 360 mg (has no administration in time range)  hydrALAZINE (APRESOLINE) tablet 25 mg (has no administration in time range)  metoprolol tartrate (LOPRESSOR) injection 5 mg (has no administration in time range)     Initial  Impression / Assessment and Plan / ED Course  I have reviewed the triage vital signs and the nursing notes.  Pertinent labs & imaging results that were available during my care of the patient were reviewed by me and considered in my medical decision making (see chart for details).    Consult: Hospitalist for admission. Final Clinical Impressions(s) / ED Diagnoses   Final diagnoses:  Pleural effusion  Shortness of breath  Acute on chronic diastolic congestive heart failure ([place])   Patient is at baseline on 2 L oxygen.  She reports increasing shortness of breath over approximately 4-day duration.  Patient does have chronic atrial fibrillation.  She is not anticoagulated due to documented either intolerances or excessive bleeding risk.  Chest x-ray shows pleural effusion.  Did proceed with CT scan to identify actual size.  She does have fairly large right-sided pleural effusion and I believe this is the likely source of her gradually increasing dyspnea.  Patient has dementia but she is alert and interactive.  She has mild to moderate increased work of breathing but is not in significant distress.  Will initiate diuresis with Lasix in the emergency department.  Patient has congestive heart failure.  To be determined if pleural effusion is secondary to CHF or other etiology.  This does not appear to be infectious.  Plain film chest x-rays indicate this has been developing incrementally. ED Discharge Orders    None       Charlesetta Shanks, MD 02/09/18 1733

## 2018-02-09 NOTE — ED Notes (Signed)
Patient transported to CT 

## 2018-02-09 NOTE — H&P (Signed)
History and Physical    Nicole [name] [id number]:[id number] DOB: [birth year] DOA: 02/09/2018  PCP: [name], DO  Patient coming from: Home   I have personally briefly reviewed patient's old medical records in [place]  Chief Complaint: SOB   HPI: Nicole [name] is a 84 y.o. female with medical history significant of vascular dementia, diastolic Heart failure, A fib, CKD stage III, cr range 1.1--1.2, last admission [id number] for Diastolic heart failure exacerbation. She presents complaining of worsening SON for last few days. She report SOB for last 3-4 months, worse lately. She is feeling weak and tired. She report dry cough, unable to bring phlegm. She denies chest pain. No weight gain, weight loss more than 50 pounds, unknown in what period of time.   ED Course: patient presents with worsening hypoxemia, dyspnea. CT chest showed large right pleural effusion. EKG a flutter RVR. BNP 365, BUN 10, cr 1.1, hb 11, INR 1.1. Chest  Xray cardiomegaly, vascular congestion,   Review of Systems: negative, except as per HPI   Past Medical History:  Diagnosis Date  . AAA (abdominal aortic aneurysm) ([place])   . Anemia   . Anxiety   . Arthritis   . Atrial fibrillation ([place])   . Cervicalgia   . Cholelithiasis   . Chronic systolic heart failure ([place])   . CKD (chronic kidney disease)   . Colon cancer ([place]) 07/1997  . Depression   . Diabetes mellitus ([place])   . Dizziness and giddiness   . Electrolyte and fluid disorders not elsewhere classified   . GERD (gastroesophageal reflux disease)   . Gout, unspecified   . Hemorrhage of rectum and anus   . Hypercholesteremia   . Hypertension   . Hypoglycemia, unspecified   . Hypopotassemia   . Hypothyroidism   . Kidney stone    renal calculi  . Other chronic allergic conjunctivitis   . Other specified cardiac dysrhythmias(427.89)   . Pain in joint, lower leg   . Perforation of bile duct   . Proteinuria   . Shortness of breath   . Small  bowel obstruction due to adhesions ([place]) 05/16/2012  . Stroke ([place])   . Swelling, mass, or lump in head and neck   . Thoracic aneurysm without mention of rupture    4.6 cm Asc Aortic aneurysm, CT 07/2015    Past Surgical History:  Procedure Laterality Date  . CARDIAC CATHETERIZATION  2008   moderate severe pulm HTN; with elevted pulm capillary wedge pressure   . CHOLECYSTECTOMY  05/14/2012   Procedure: LAPAROSCOPIC CHOLECYSTECTOMY WITH INTRAOPERATIVE CHOLANGIOGRAM;  Surgeon: Haywood Lasso, MD;  Location: [place];  Service: General;  Laterality: N/A;  . ERCP  05/17/2012   Procedure: ENDOSCOPIC RETROGRADE CHOLANGIOPANCREATOGRAPHY (ERCP);  Surgeon: Missy Sabins, MD;  Location: [place];  Service: Gastroenterology;  Laterality: N/A;  . ERCP  08/12/2012   Procedure: ENDOSCOPIC RETROGRADE CHOLANGIOPANCREATOGRAPHY (ERCP);  Surgeon: Missy Sabins, MD;  Location: [facility];  Service: Endoscopy;  Laterality: N/A;  . HEMICOLECTOMY  08/11/1997  . KIDNEY STONE SURGERY    . TRANSTHORACIC ECHOCARDIOGRAM  11/2009   EF 45-50%, mild-mod AV regurg; mild MR; mod TR; ascending aorta mildly dilated     reports that she has never smoked. She has never used smokeless tobacco. She reports that she does not drink alcohol or use drugs.  Allergies  Allergen Reactions  . Iohexol Nausea And Vomiting  . Iodinated Diagnostic Agents Nausea And Vomiting  . Z-Pak [Azithromycin]  Other (See Comments)    Reaction unknown by family    Family History  Problem Relation Age of Onset  . Heart disease Mother   . Stroke Father   . Hypertension Daughter   . Hypertension Son   . Diabetes Sister   . Hypertension Son     Prior to Admission medications   Medication Sig Start Date End Date Taking? Authorizing Provider  acetaminophen (TYLENOL) 650 MG CR tablet Take 650 mg by mouth every 8 (eight) hours as needed for pain.   Yes [provider]  allopurinol (ZYLOPRIM) 100 MG tablet Take 1 tablet (100 mg total) by  mouth daily. 08/21/17  Yes [name], [name], DO  aspirin EC 81 MG EC tablet Take 1 tablet (81 mg total) by mouth daily. 10/17/17  Yes Danford, Suann Larry, MD  diltiazem (CARDIZEM CD) 360 MG 24 hr capsule Take 360 mg by mouth daily.   Yes [provider]  donepezil (ARICEPT) 10 MG tablet Take 1 tablet (10 mg total) by mouth at bedtime. 10/16/17  Yes Danford, Suann Larry, MD  ferrous sulfate 325 (65 FE) MG EC tablet TAKE 1 TABLET (325 MG TOTAL) BY MOUTH 2 (TWO) TIMES DAILY WITH A MEAL. 01/06/18  Yes [name], [name], DO  furosemide (LASIX) 40 MG tablet Take 1 tablet (40 mg total) by mouth daily. 08/08/17  Yes Lauree Chandler, NP  hydrALAZINE (APRESOLINE) 25 MG tablet TAKE 3 TABLETS (75 MG) TABLETS 3 TIMES A DAY Patient taking differently: Take 75 mg by mouth 3 (three) times daily.  01/13/18  Yes [name], [name], DO  Ibuprofen 200 MG CAPS Take 200 mg by mouth every 8 (eight) hours as needed for mild pain.   Yes [provider]  ipratropium (ATROVENT) 0.02 % nebulizer solution Take 2.5 mLs (0.5 mg total) by nebulization every 6 (six) hours as needed for wheezing or shortness of breath. 11/21/17  Yes [name], [name], DO  isosorbide mononitrate (IMDUR) 30 MG 24 hr tablet Take 1 tablet (30 mg total) by mouth 2 (two) times daily. 08/08/17  Yes Lauree Chandler, NP  levothyroxine (SYNTHROID, LEVOTHROID) 25 MCG tablet Take 1 tablet (25 mcg total) by mouth daily before breakfast. 11/15/17  Yes Gildardo Cranker, DO  metoprolol succinate (TOPROL-XL) 25 MG 24 hr tablet Take 1 tablet (25 mg total) by mouth daily. 09/27/17  Yes [name], [name], DO  OVER THE COUNTER MEDICATION Apply 1 application topically daily as needed (for back pain).   Yes [provider]  potassium chloride (KLOR-CON 10) 10 MEQ tablet TAKE 2 TABLETS (20 MEQ TOTAL) BY MOUTH DAILY. 11/20/17  Yes [name], [name], DO  sertraline (ZOLOFT) 50 MG tablet TAKE 1 TABLET BY MOUTH EVERY DAY Patient taking differently: TAKE 1 TABLET (50  MG TOTALLY) BY MOUTH EVERY DAY 12/26/17  Yes [name], [name], DO  OXYGEN Inhale 2 L into the lungs as needed.    [provider]    Physical Exam: Vitals:   02/09/18 1700 02/09/18 1730 02/09/18 1736 02/09/18 1738  BP:  (!) 163/96  (!) 163/96  Pulse: 81  84   Resp: 19 19 19    Temp:      TempSrc:      SpO2: 100%  100%     Constitutional: NAD, calm, comfortable, thin appearing.  Vitals:   02/09/18 1700 02/09/18 1730 02/09/18 1736 02/09/18 1738  BP:  (!) 163/96  (!) 163/96  Pulse: 81  84   Resp: 19 19 19  Temp:      TempSrc:      SpO2: 100%  100%    Eyes: PERRL, lids and conjunctivae normal,  ENMT: Mucous membranes are moist. Posterior pharynx clear of any exudate or lesions.Normal dentition.  Neck: normal, supple, no masses, no thyromegaly Respiratory: normal respiratory effort, crackles right side.  Cardiovascular: Irregular rate and rhythm, no murmurs / rubs / gallops. No extremity edema. 2+ pedal pulses. No carotid bruits.  Abdomen: no tenderness, no masses palpated. No hepatosplenomegaly. Bowel sounds positive.  Musculoskeletal: no clubbing / cyanosis. No joint deformity upper and lower extremities. Good ROM, no contractures. Normal muscle tone.  Skin: no rashes, lesions, ulcers. No induration Neurologic: CN 2-12 grossly intact. Sensation intact, DTR normal. Strength 5/5 in all 4.  Psychiatric:  Normal mood.     Labs on Admission: I have personally reviewed following labs and imaging studies  CBC: Recent Labs  Lab 02/09/18 1239  WBC 5.0  HGB 11.2*  HCT 33.6*  MCV 81.4  PLT 427   Basic Metabolic Panel: Recent Labs  Lab 02/09/18 1239  NA 143  K 3.7  CL 102  CO2 30  GLUCOSE 101*  BUN 10  CREATININE 1.10*  CALCIUM 8.7*   GFR: CrCl cannot be calculated (Unknown ideal weight.). Liver Function Tests: No results for input(s): AST, ALT, ALKPHOS, BILITOT, PROT, ALBUMIN in the last 168 hours. No results for input(s): LIPASE, AMYLASE in the last 168  hours. No results for input(s): AMMONIA in the last 168 hours. Coagulation Profile: Recent Labs  Lab 02/09/18 1239  INR 1.13   Cardiac Enzymes: No results for input(s): CKTOTAL, CKMB, CKMBINDEX, TROPONINI in the last 168 hours. BNP (last 3 results) No results for input(s): PROBNP in the last 8760 hours. HbA1C: No results for input(s): HGBA1C in the last 72 hours. CBG: No results for input(s): GLUCAP in the last 168 hours. Lipid Profile: No results for input(s): CHOL, HDL, LDLCALC, TRIG, CHOLHDL, LDLDIRECT in the last 72 hours. Thyroid Function Tests: No results for input(s): TSH, T4TOTAL, FREET4, T3FREE, THYROIDAB in the last 72 hours. Anemia Panel: No results for input(s): VITAMINB12, FOLATE, FERRITIN, TIBC, IRON, RETICCTPCT in the last 72 hours. Urine analysis:    Component Value Date/Time   COLORURINE AMBER (A) 05/23/2017 0156   APPEARANCEUR HAZY (A) 05/23/2017 0156   LABSPEC 1.026 05/23/2017 0156   PHURINE 5.0 05/23/2017 0156   GLUCOSEU NEGATIVE 05/23/2017 0156   HGBUR NEGATIVE 05/23/2017 0156   BILIRUBINUR SMALL (A) 05/23/2017 0156   KETONESUR NEGATIVE 05/23/2017 0156   PROTEINUR >=300 (A) 05/23/2017 0156   UROBILINOGEN 2.0 (H) 04/13/2015 1844   NITRITE NEGATIVE 05/23/2017 0156   LEUKOCYTESUR NEGATIVE 05/23/2017 0156    Radiological Exams on Admission: Dg Chest 2 View  Result Date: 02/09/2018 CLINICAL DATA:  Shortness of breath EXAM: CHEST - 2 VIEW COMPARISON:  09/11/2017 FINDINGS: Stable cardiomegaly with vascular congestion and mild to moderate pleural effusions. Associated bibasilar atelectasis/partial consolidation. Upper lungs remain clear. No pneumothorax. Trachea is midline. Aorta is atherosclerotic. Degenerative changes of the spine with diffuse osteopenia. IMPRESSION: Cardiomegaly with vascular congestion and pleural effusions. Bibasilar compressive atelectasis/partial consolidation. Electronically Signed   By: Jerilynn Mages.  Shick M.D.   On: 02/09/2018 13:13   Ct Chest  Wo Contrast  Result Date: 02/09/2018 CLINICAL DATA:  Shortness of breath EXAM: CT CHEST WITHOUT CONTRAST TECHNIQUE: Multidetector CT imaging of the chest was performed following the standard protocol without IV contrast. COMPARISON:  CTA chest 01/11/2017 FINDINGS: Cardiovascular: Severe cardiomegaly. There are coronary  artery calcifications and moderate aortic atherosclerosis. The main pulmonary artery is enlarged, measuring 4.5 cm at the bifurcation. Unchanged 4.8 cm ascending thoracic aortic aneurysm. Mediastinum/Nodes: Visualized thyroid is normal. There is no mediastinal or axillary lymphadenopathy. Lungs/Pleura: Large right and small left pleural effusions with associated atelectasis. There is at least 50% collapse of the right lower lobe. Pleural effusions are larger than on the prior CT of 01/11/2017. There is otherwise no consolidation. No pulmonary edema. Upper Abdomen: The unenhanced appearance of the visible portions of the upper abdominal organs is normal. Musculoskeletal: No acute abnormality or bony spinal canal stenosis. IMPRESSION: 1. Large right and small left pleural effusions with associated atelectasis. No other airspace disease. 2. Enlarged main pulmonary artery, consistent with pulmonary hypertension. 3. Unchanged 4.8 cm ascending thoracic aortic aneurysm. Recommend semi-annual imaging followup by CTA or MRA and referral to cardiothoracic surgery if not already obtained. 4. Cardiomegaly with coronary artery and aortic atherosclerosis (ICD10-I70.0). The above recommendation follows 2010 ACCF/AHA/AATS/ACR/ASA/SCA/SCAI/SIR/STS/SVM Guidelines for the Diagnosis and Management of Patients With Thoracic Aortic Disease. Circulation. 2010; 121: T419-Q222 Electronically Signed   By: Ulyses Jarred M.D.   On: 02/09/2018 16:40    EKG: Independently reviewed. A fib RVR   Assessment/Plan Active Problems:   History of colon cancer, stage III   Thoracic aortic aneurysm without rupture (HCC)   Atrial  fibrillation, chronic (HCC)   Type 2 diabetes mellitus with peripheral neuropathy (HCC)   Vascular dementia   Acute on chronic systolic heart failure (HCC)   Hypothyroidism due to acquired atrophy of thyroid   DNR (do not resuscitate)   Hypoxemia  1-Acute on chronic hypoxic respiratory Failure;  In setting of large pleural effusion and diastolic HF exacerbation.  Admit to telemetry, under observation.  IV lasix.  IR to perform thoracentesis. Send fluid for cytology, protein, glucose, LDH. Culture.  Guaifenesin for cough.   2-A fib, flutter;  Resume Cardizem, metoprolol.  Not on anticoagulation.  Monitor on telemetry  3-Acute diastolic Heart failure exacerbation.  Dyspnea, bilateral pleural effusion, elevated BNP.  IV lasix.  Continue with Imdur, hydralazine.   4-Dementia; continue with aricept.  Stable.   5-Hypothyroidism;  Continue with synthroid.   6-HTN;  Continue with hydralazine, Imdur, metoprolol, Cardizem   DVT prophylaxis: SCD, due to thoracentesis 7-22 Code Status: DNR, discussed with daughter  Family Communication: daughter over phone Disposition Plan: home when stable.  Consults called: IR consulted.  Admission status: Telemetry, Observation.    Elmarie Shiley MD [facility] Pager [phone]  If 7PM-7AM, please contact night-coverage [email or web address] Password [facility]  02/09/2018, 6:19 PM

## 2018-02-09 NOTE — ED Notes (Signed)
Patient transported to X-ray 

## 2018-02-10 ENCOUNTER — Other Ambulatory Visit: Payer: Self-pay

## 2018-02-10 ENCOUNTER — Encounter (HOSPITAL_COMMUNITY): Payer: Self-pay | Admitting: Diagnostic Radiology

## 2018-02-10 ENCOUNTER — Observation Stay (HOSPITAL_COMMUNITY): Payer: Medicare HMO

## 2018-02-10 DIAGNOSIS — Z66 Do not resuscitate: Secondary | ICD-10-CM | POA: Diagnosis not present

## 2018-02-10 DIAGNOSIS — J9621 Acute and chronic respiratory failure with hypoxia: Secondary | ICD-10-CM | POA: Diagnosis not present

## 2018-02-10 DIAGNOSIS — R0602 Shortness of breath: Secondary | ICD-10-CM

## 2018-02-10 DIAGNOSIS — E034 Atrophy of thyroid (acquired): Secondary | ICD-10-CM | POA: Diagnosis not present

## 2018-02-10 DIAGNOSIS — I5033 Acute on chronic diastolic (congestive) heart failure: Secondary | ICD-10-CM | POA: Diagnosis not present

## 2018-02-10 DIAGNOSIS — I482 Chronic atrial fibrillation: Secondary | ICD-10-CM | POA: Diagnosis not present

## 2018-02-10 DIAGNOSIS — R0600 Dyspnea, unspecified: Secondary | ICD-10-CM | POA: Diagnosis not present

## 2018-02-10 DIAGNOSIS — J90 Pleural effusion, not elsewhere classified: Secondary | ICD-10-CM | POA: Diagnosis not present

## 2018-02-10 DIAGNOSIS — R69 Illness, unspecified: Secondary | ICD-10-CM | POA: Diagnosis not present

## 2018-02-10 DIAGNOSIS — J948 Other specified pleural conditions: Secondary | ICD-10-CM | POA: Diagnosis not present

## 2018-02-10 HISTORY — PX: IR THORACENTESIS ASP PLEURAL SPACE W/IMG GUIDE: IMG5380

## 2018-02-10 LAB — BODY FLUID CELL COUNT WITH DIFFERENTIAL
Eos, Fluid: 0 %
LYMPHS FL: 91 %
Monocyte-Macrophage-Serous Fluid: 6 % — ABNORMAL LOW (ref 50–90)
Neutrophil Count, Fluid: 3 % (ref 0–25)
Total Nucleated Cell Count, Fluid: 678 cu mm (ref 0–1000)

## 2018-02-10 LAB — CBC
HEMATOCRIT: 30.7 % — AB (ref 36.0–46.0)
HEMOGLOBIN: 10.3 g/dL — AB (ref 12.0–15.0)
MCH: 27.5 pg (ref 26.0–34.0)
MCHC: 33.6 g/dL (ref 30.0–36.0)
MCV: 82.1 fL (ref 78.0–100.0)
Platelets: 211 10*3/uL (ref 150–400)
RBC: 3.74 MIL/uL — AB (ref 3.87–5.11)
RDW: 14.4 % (ref 11.5–15.5)
WBC: 5.6 10*3/uL (ref 4.0–10.5)

## 2018-02-10 LAB — BASIC METABOLIC PANEL
ANION GAP: 9 (ref 5–15)
BUN: 14 mg/dL (ref 8–23)
CO2: 34 mmol/L — ABNORMAL HIGH (ref 22–32)
Calcium: 8.7 mg/dL — ABNORMAL LOW (ref 8.9–10.3)
Chloride: 102 mmol/L (ref 98–111)
Creatinine, Ser: 1.51 mg/dL — ABNORMAL HIGH (ref 0.44–1.00)
GFR, EST AFRICAN AMERICAN: 35 mL/min — AB (ref >60)
GFR, EST NON AFRICAN AMERICAN: 31 mL/min — AB (ref >60)
Glucose, Bld: 98 mg/dL (ref 70–99)
Potassium: 3.2 mmol/L — ABNORMAL LOW (ref 3.5–5.1)
SODIUM: 145 mmol/L (ref 135–145)

## 2018-02-10 LAB — HEPATIC FUNCTION PANEL
ALBUMIN: 2.7 g/dL — AB (ref 3.5–5.0)
ALK PHOS: 65 U/L (ref 38–126)
ALT: 7 U/L (ref 0–44)
AST: 16 U/L (ref 15–41)
BILIRUBIN INDIRECT: 0.5 mg/dL (ref 0.3–0.9)
Bilirubin, Direct: 0.2 mg/dL (ref 0.0–0.2)
TOTAL PROTEIN: 6.6 g/dL (ref 6.5–8.1)
Total Bilirubin: 0.7 mg/dL (ref 0.3–1.2)

## 2018-02-10 LAB — PROTEIN, PLEURAL OR PERITONEAL FLUID

## 2018-02-10 LAB — GLUCOSE, PLEURAL OR PERITONEAL FLUID: GLUCOSE FL: 102 mg/dL

## 2018-02-10 LAB — GRAM STAIN

## 2018-02-10 LAB — PROTIME-INR
INR: 1.17
Prothrombin Time: 14.8 seconds (ref 11.4–15.2)

## 2018-02-10 LAB — ALBUMIN, PLEURAL OR PERITONEAL FLUID: Albumin, Fluid: 1.3 g/dL

## 2018-02-10 LAB — LACTATE DEHYDROGENASE, PLEURAL OR PERITONEAL FLUID: LD FL: 44 U/L — AB (ref 3–23)

## 2018-02-10 LAB — LACTATE DEHYDROGENASE: LDH: 106 U/L (ref 98–192)

## 2018-02-10 MED ORDER — POTASSIUM CHLORIDE CRYS ER 20 MEQ PO TBCR
20.0000 meq | EXTENDED_RELEASE_TABLET | Freq: Once | ORAL | Status: AC
  Administered 2018-02-10: 20 meq via ORAL
  Filled 2018-02-10: qty 1

## 2018-02-10 MED ORDER — LIDOCAINE HCL (PF) 2 % IJ SOLN
INTRAMUSCULAR | Status: AC
  Filled 2018-02-10: qty 20

## 2018-02-10 MED ORDER — LIDOCAINE HCL 2 % IJ SOLN
INTRAMUSCULAR | Status: DC | PRN
  Administered 2018-02-10: 10 mL

## 2018-02-10 MED ORDER — HALOPERIDOL LACTATE 5 MG/ML IJ SOLN
1.0000 mg | Freq: Once | INTRAMUSCULAR | Status: AC
  Administered 2018-02-10: 1 mg via INTRAVENOUS
  Filled 2018-02-10: qty 1

## 2018-02-10 MED ORDER — METOPROLOL SUCCINATE ER 25 MG PO TB24: 25.0000 mg | ORAL_TABLET | Freq: Every day | ORAL | Status: DC

## 2018-02-10 NOTE — Progress Notes (Signed)
Progress Note    Nicole [name]  BZJ:[id number] DOB: Jan 30, 1931  DOA: 02/09/2018 PCP: [name], DO    Brief Narrative:    Medical records reviewed and are as summarized below:  Nicole [name] is an 84 y.o. female with medical history significant of vascular dementia, diastolic Heart failure, A fib, CKD stage III, cr range 1.1--1.2, last admission [id number] for Diastolic heart failure exacerbation. She presents complaining of worsening SON for last few days. She report SOB for last 3-4 months, worse lately. She is feeling weak and tired. She report dry cough, unable to bring phlegm. She denies chest pain. No weight gain, weight loss more than 50 pounds, unknown in what period of time.     Assessment/Plan:   Active Problems:   History of colon cancer, stage III   Thoracic aortic aneurysm without rupture (HCC)   Atrial fibrillation, chronic (HCC)   Type 2 diabetes mellitus with peripheral neuropathy (HCC)   Vascular dementia   Acute on chronic systolic heart failure (HCC)   Hypothyroidism due to acquired atrophy of thyroid   DNR (do not resuscitate)   Hypoxemia  Acute on chronic hypoxic respiratory Failure due to pleural effusion -large pleural effusion and diastolic HF exacerbation.  -IR consulted for thoracentesis. Send fluid for cytology, protein, glucose, LDH. Culture.  Guaifenesin for cough.   A fib, flutter;  Resume Cardizem Monitor on telemetry -not on anticoagulation due to bleeding risk  Hypokalemia -replete  Acute diastolic Heart failure exacerbation.  Dyspnea, bilateral pleural effusion, elevated BNP.  D/c IV lasix as AKI  Dementia; continue with aricept.  Stable.  -has care-givers at home  Hypothyroidism;  Continue with synthroid.   HTN;  BP on low side -d/c hydralazine/metoprolol- adjust imdur/cardizem as needed  Adult failure to thrive Ongoing FTT, worsens with disease progression from dementia and CHF. Following with palliative  care (per PCP)     Family Communication/Anticipated D/C date and plan/Code Status   DVT prophylaxis: scd Code Status: DNR  Family Communication: care giver at bedside Disposition Plan: pending work up   Medical Consultants:    IR    Subjective:   Feels better already but she also states she already had her thoracentesis this AM   Objective:    Vitals:   02/10/18 0501 02/10/18 0503 02/10/18 0924 02/10/18 1042  BP: (!) 95/57  (!) 99/56 (!) 101/55  Pulse: (!) 47 (!) 49 (!) 44 (!) 43  Resp: 20  16 17   Temp: 97.9 F (36.6 C)  98 F (36.7 C) (!) 97.4 F (36.3 C)  TempSrc: Oral  Oral Oral  SpO2: 99% 99% 100% 100%  Weight:      Height:        Intake/Output Summary (Last 24 hours) at 02/10/2018 1512 Last data filed at 02/10/2018 1349 Gross per 24 hour  Intake 583 ml  Output 350 ml  Net 233 ml   Filed Weights   02/09/18 1830 02/09/18 2225  Weight: 55.8 kg (123 lb 0.3 oz) 52.6 kg (115 lb 15.4 oz)    Exam: In bed, pleasantly confused irr Diminished breath sounds R>L- on 3L +Bs, soft, NT Min LE edema  Data Reviewed:   I have personally reviewed following labs and imaging studies:  Labs: Labs show the following:   Basic Metabolic Panel: Recent Labs  Lab 02/09/18 1239 02/10/18 0824  NA 143 145  K 3.7 3.2*  CL 102 102  CO2 30 34*  GLUCOSE 101* 98  BUN  10 14  CREATININE 1.10* 1.51*  CALCIUM 8.7* 8.7*   GFR Estimated Creatinine Clearance: 23 mL/min (A) (by C-G formula based on SCr of 1.51 mg/dL (H)). Liver Function Tests: Recent Labs  Lab 02/10/18 0824  AST 16  ALT 7  ALKPHOS 65  BILITOT 0.7  PROT 6.6  ALBUMIN 2.7*   No results for input(s): LIPASE, AMYLASE in the last 168 hours. No results for input(s): AMMONIA in the last 168 hours. Coagulation profile Recent Labs  Lab 02/09/18 1239 02/10/18 0824  INR 1.13 1.17    CBC: Recent Labs  Lab 02/09/18 1239 02/10/18 0824  WBC 5.0 5.6  HGB 11.2* 10.3*  HCT 33.6* 30.7*  MCV 81.4  82.1  PLT 200 211   Cardiac Enzymes: No results for input(s): CKTOTAL, CKMB, CKMBINDEX, TROPONINI in the last 168 hours. BNP (last 3 results) No results for input(s): PROBNP in the last 8760 hours. CBG: No results for input(s): GLUCAP in the last 168 hours. D-Dimer: No results for input(s): DDIMER in the last 72 hours. Hgb A1c: No results for input(s): HGBA1C in the last 72 hours. Lipid Profile: No results for input(s): CHOL, HDL, LDLCALC, TRIG, CHOLHDL, LDLDIRECT in the last 72 hours. Thyroid function studies: No results for input(s): TSH, T4TOTAL, T3FREE, THYROIDAB in the last 72 hours.  Invalid input(s): FREET3 Anemia work up: No results for input(s): VITAMINB12, FOLATE, FERRITIN, TIBC, IRON, RETICCTPCT in the last 72 hours. Sepsis Labs: Recent Labs  Lab 02/09/18 1239 02/10/18 0824  WBC 5.0 5.6    Microbiology No results found for this or any previous visit (from the past 240 hour(s)).  Procedures and diagnostic studies:  Dg Chest 2 View  Result Date: 02/09/2018 CLINICAL DATA:  Shortness of breath EXAM: CHEST - 2 VIEW COMPARISON:  09/11/2017 FINDINGS: Stable cardiomegaly with vascular congestion and mild to moderate pleural effusions. Associated bibasilar atelectasis/partial consolidation. Upper lungs remain clear. No pneumothorax. Trachea is midline. Aorta is atherosclerotic. Degenerative changes of the spine with diffuse osteopenia. IMPRESSION: Cardiomegaly with vascular congestion and pleural effusions. Bibasilar compressive atelectasis/partial consolidation. Electronically Signed   By: Jerilynn Mages.  Shick M.D.   On: 02/09/2018 13:13   Ct Chest Wo Contrast  Result Date: 02/09/2018 CLINICAL DATA:  Shortness of breath EXAM: CT CHEST WITHOUT CONTRAST TECHNIQUE: Multidetector CT imaging of the chest was performed following the standard protocol without IV contrast. COMPARISON:  CTA chest 01/11/2017 FINDINGS: Cardiovascular: Severe cardiomegaly. There are coronary artery calcifications  and moderate aortic atherosclerosis. The main pulmonary artery is enlarged, measuring 4.5 cm at the bifurcation. Unchanged 4.8 cm ascending thoracic aortic aneurysm. Mediastinum/Nodes: Visualized thyroid is normal. There is no mediastinal or axillary lymphadenopathy. Lungs/Pleura: Large right and small left pleural effusions with associated atelectasis. There is at least 50% collapse of the right lower lobe. Pleural effusions are larger than on the prior CT of 01/11/2017. There is otherwise no consolidation. No pulmonary edema. Upper Abdomen: The unenhanced appearance of the visible portions of the upper abdominal organs is normal. Musculoskeletal: No acute abnormality or bony spinal canal stenosis. IMPRESSION: 1. Large right and small left pleural effusions with associated atelectasis. No other airspace disease. 2. Enlarged main pulmonary artery, consistent with pulmonary hypertension. 3. Unchanged 4.8 cm ascending thoracic aortic aneurysm. Recommend semi-annual imaging followup by CTA or MRA and referral to cardiothoracic surgery if not already obtained. 4. Cardiomegaly with coronary artery and aortic atherosclerosis (ICD10-I70.0). The above recommendation follows 2010 ACCF/AHA/AATS/ACR/ASA/SCA/SCAI/SIR/STS/SVM Guidelines for the Diagnosis and Management of Patients With Thoracic Aortic Disease. Circulation. 2010;  121: [id number] Electronically Signed   By: Ulyses Jarred M.D.   On: 02/09/2018 16:40    Medications:   . allopurinol  100 mg Oral Daily  . aspirin EC  81 mg Oral Daily  . diltiazem  360 mg Oral Daily  . donepezil  10 mg Oral QHS  . feeding supplement (ENSURE ENLIVE)  237 mL Oral BID BM  . ferrous sulfate  325 mg Oral BID WC  . guaiFENesin  600 mg Oral BID  . isosorbide mononitrate  30 mg Oral BID  . levothyroxine  25 mcg Oral QAC breakfast  . lidocaine      . potassium chloride  20 mEq Oral Daily  . sertraline  50 mg Oral Daily  . sodium chloride flush  3 mL Intravenous Q12H    Continuous Infusions: . sodium chloride       LOS: 0 days   Geradine Girt  [facility]   *Please refer to [name], password [email or web address] to get updated schedule on who will round on this patient, as hospitalists switch teams weekly. If 7PM-7AM, please contact night-coverage at [email or web address], password [email or web address] for any overnight needs.  02/10/2018, 3:12 PM

## 2018-02-10 NOTE — Procedures (Signed)
PROCEDURE SUMMARY:  Successful image-guided right thoracentesis. Yielded 1.0 liter of serosanguinous fluid. Patient tolerated procedure well. No immediate complications.  Specimen was sent for labs. CXR ordered.  Joaquim Nam PA-C 02/10/2018 3:43 PM

## 2018-02-10 NOTE — Progress Notes (Signed)
B/P is 101/55 and HR is 85. MD notified regarding this reading. Orders placed and followed. Will continue to monitor.

## 2018-02-11 ENCOUNTER — Ambulatory Visit: Payer: Medicare HMO | Admitting: Internal Medicine

## 2018-02-11 DIAGNOSIS — L89153 Pressure ulcer of sacral region, stage 3: Secondary | ICD-10-CM | POA: Diagnosis not present

## 2018-02-11 DIAGNOSIS — Z85038 Personal history of other malignant neoplasm of large intestine: Secondary | ICD-10-CM | POA: Diagnosis not present

## 2018-02-11 DIAGNOSIS — Z8673 Personal history of transient ischemic attack (TIA), and cerebral infarction without residual deficits: Secondary | ICD-10-CM | POA: Diagnosis not present

## 2018-02-11 DIAGNOSIS — I482 Chronic atrial fibrillation: Secondary | ICD-10-CM | POA: Diagnosis not present

## 2018-02-11 DIAGNOSIS — J9621 Acute and chronic respiratory failure with hypoxia: Secondary | ICD-10-CM | POA: Diagnosis present

## 2018-02-11 DIAGNOSIS — R0600 Dyspnea, unspecified: Secondary | ICD-10-CM | POA: Diagnosis not present

## 2018-02-11 DIAGNOSIS — Z87442 Personal history of urinary calculi: Secondary | ICD-10-CM | POA: Diagnosis not present

## 2018-02-11 DIAGNOSIS — N179 Acute kidney failure, unspecified: Secondary | ICD-10-CM | POA: Diagnosis not present

## 2018-02-11 DIAGNOSIS — E034 Atrophy of thyroid (acquired): Secondary | ICD-10-CM

## 2018-02-11 DIAGNOSIS — F05 Delirium due to known physiological condition: Secondary | ICD-10-CM | POA: Diagnosis present

## 2018-02-11 DIAGNOSIS — J90 Pleural effusion, not elsewhere classified: Secondary | ICD-10-CM | POA: Diagnosis not present

## 2018-02-11 DIAGNOSIS — Z833 Family history of diabetes mellitus: Secondary | ICD-10-CM | POA: Diagnosis not present

## 2018-02-11 DIAGNOSIS — E1142 Type 2 diabetes mellitus with diabetic polyneuropathy: Secondary | ICD-10-CM | POA: Diagnosis not present

## 2018-02-11 DIAGNOSIS — Z66 Do not resuscitate: Secondary | ICD-10-CM | POA: Diagnosis not present

## 2018-02-11 DIAGNOSIS — R0602 Shortness of breath: Secondary | ICD-10-CM | POA: Diagnosis not present

## 2018-02-11 DIAGNOSIS — Z823 Family history of stroke: Secondary | ICD-10-CM | POA: Diagnosis not present

## 2018-02-11 DIAGNOSIS — Z9049 Acquired absence of other specified parts of digestive tract: Secondary | ICD-10-CM | POA: Diagnosis not present

## 2018-02-11 DIAGNOSIS — F0151 Vascular dementia with behavioral disturbance: Secondary | ICD-10-CM

## 2018-02-11 DIAGNOSIS — I714 Abdominal aortic aneurysm, without rupture: Secondary | ICD-10-CM | POA: Diagnosis present

## 2018-02-11 DIAGNOSIS — I130 Hypertensive heart and chronic kidney disease with heart failure and stage 1 through stage 4 chronic kidney disease, or unspecified chronic kidney disease: Secondary | ICD-10-CM | POA: Diagnosis not present

## 2018-02-11 DIAGNOSIS — I5033 Acute on chronic diastolic (congestive) heart failure: Secondary | ICD-10-CM | POA: Diagnosis not present

## 2018-02-11 DIAGNOSIS — Z9981 Dependence on supplemental oxygen: Secondary | ICD-10-CM | POA: Diagnosis not present

## 2018-02-11 DIAGNOSIS — M109 Gout, unspecified: Secondary | ICD-10-CM | POA: Diagnosis present

## 2018-02-11 DIAGNOSIS — E7800 Pure hypercholesterolemia, unspecified: Secondary | ICD-10-CM | POA: Diagnosis present

## 2018-02-11 DIAGNOSIS — I5023 Acute on chronic systolic (congestive) heart failure: Secondary | ICD-10-CM | POA: Diagnosis not present

## 2018-02-11 DIAGNOSIS — R69 Illness, unspecified: Secondary | ICD-10-CM | POA: Diagnosis not present

## 2018-02-11 DIAGNOSIS — I4892 Unspecified atrial flutter: Secondary | ICD-10-CM | POA: Diagnosis not present

## 2018-02-11 DIAGNOSIS — K219 Gastro-esophageal reflux disease without esophagitis: Secondary | ICD-10-CM | POA: Diagnosis present

## 2018-02-11 DIAGNOSIS — E1121 Type 2 diabetes mellitus with diabetic nephropathy: Secondary | ICD-10-CM | POA: Diagnosis present

## 2018-02-11 DIAGNOSIS — N183 Chronic kidney disease, stage 3 (moderate): Secondary | ICD-10-CM | POA: Diagnosis present

## 2018-02-11 DIAGNOSIS — Z681 Body mass index (BMI) 19 or less, adult: Secondary | ICD-10-CM | POA: Diagnosis not present

## 2018-02-11 DIAGNOSIS — I5043 Acute on chronic combined systolic (congestive) and diastolic (congestive) heart failure: Secondary | ICD-10-CM | POA: Diagnosis not present

## 2018-02-11 LAB — CBC
HEMATOCRIT: 35.1 % — AB (ref 36.0–46.0)
Hemoglobin: 11.7 g/dL — ABNORMAL LOW (ref 12.0–15.0)
MCH: 27.3 pg (ref 26.0–34.0)
MCHC: 33.3 g/dL (ref 30.0–36.0)
MCV: 82 fL (ref 78.0–100.0)
Platelets: 183 10*3/uL (ref 150–400)
RBC: 4.28 MIL/uL (ref 3.87–5.11)
RDW: 14.5 % (ref 11.5–15.5)
WBC: 7.5 10*3/uL (ref 4.0–10.5)

## 2018-02-11 LAB — BASIC METABOLIC PANEL
Anion gap: 11 (ref 5–15)
BUN: 21 mg/dL (ref 8–23)
CHLORIDE: 101 mmol/L (ref 98–111)
CO2: 31 mmol/L (ref 22–32)
Calcium: 8.5 mg/dL — ABNORMAL LOW (ref 8.9–10.3)
Creatinine, Ser: 2.23 mg/dL — ABNORMAL HIGH (ref 0.44–1.00)
GFR calc Af Amer: 22 mL/min — ABNORMAL LOW (ref >60)
GFR calc non Af Amer: 19 mL/min — ABNORMAL LOW (ref >60)
Glucose, Bld: 61 mg/dL — ABNORMAL LOW (ref 70–99)
Potassium: 3.6 mmol/L (ref 3.5–5.1)
SODIUM: 143 mmol/L (ref 135–145)

## 2018-02-11 LAB — GLUCOSE, CAPILLARY
Glucose-Capillary: 89 mg/dL (ref 70–99)
Glucose-Capillary: 176 mg/dL — ABNORMAL HIGH (ref 70–99)

## 2018-02-11 MED ORDER — DILTIAZEM HCL ER COATED BEADS 240 MG PO CP24
240.0000 mg | ORAL_CAPSULE | Freq: Every day | ORAL | Status: DC
  Administered 2018-02-12: 240 mg via ORAL
  Filled 2018-02-11: qty 1
  Filled 2018-02-11: qty 2

## 2018-02-11 MED ORDER — DILTIAZEM HCL ER COATED BEADS 240 MG PO CP24
240.0000 mg | ORAL_CAPSULE | Freq: Every day | ORAL | Status: DC
  Administered 2018-02-11: 240 mg via ORAL
  Filled 2018-02-11: qty 1
  Filled 2018-02-11 (×2): qty 2

## 2018-02-11 NOTE — Evaluation (Signed)
Physical Therapy Evaluation Patient Details Name: Nicole [name] MRN: [id number] DOB: 04-01-1931 Today's Date: 02/11/2018   History of Present Illness  Nicole [name] is a 84 y.o. female with medical history significant of vascular dementia, diastolic Heart failure, A fib, CKD stage III, cr range 1.1--1.2, last admission [id number] for Diastolic heart failure exacerbation. She presents complaining of worsening SOB for last few days. She report SOB for last 3-4 months, worse lately. She is feeling weak and tired.  Clinical Impression   Pt admitted with above diagnosis. Pt currently with functional limitations due to the deficits listed below (see PT Problem List). Her PLOF isn't super-clear to me (her caregiver, Nicole [name], was difficult to understand at times), but gleaned from chart review that she used RW to ambulate, and it seems she has 24 hour assist; Active with Palliative Care as an outpt; Presents with decr functional mobility, and noted O2 sats to decr with hallway ambulation;  Pt will benefit from skilled PT to increase their independence and safety with mobility to allow discharge to the venue listed below.       Follow Up Recommendations Home health PT;Supervision/Assistance - 24 hour(if not consistent 24 hour assist, consider SNF)    Equipment Recommendations  None recommended by PT    Recommendations for Other Services OT consult(ordered per protocol)     Precautions / Restrictions Precautions Precautions: Fall Precaution Comments: Telesitter in room      Mobility  Bed Mobility Overal bed mobility: Needs Assistance Bed Mobility: Supine to Sit     Supine to sit: Mod assist     General bed mobility comments: Mod handheld assist to pull to sit  Transfers Overall transfer level: Needs assistance Equipment used: Rolling walker (2 wheeled) Transfers: Sit to/from Stand Sit to Stand: Mod assist         General transfer comment: Cues for hand placement and safety;  Mod assist to power up from low bed; noted tends to brace backs of LEs against bed for stability, indicative of incr fall risk  Ambulation/Gait Ambulation/Gait assistance: Min assist Gait Distance (Feet): 80 Feet Assistive device: Rolling walker (2 wheeled) Gait Pattern/deviations: Decreased step length - right;Decreased step length - left;Decreased stride length     General Gait Details: Cues to self-monitor for activity tolerance; Reported dizziness with ambulation; Walked on room air, and once we returned, noted O2 sats 85%; restarted O2  Stairs            Wheelchair Mobility    Modified Rankin (Stroke Patients Only)       Balance                                             Pertinent Vitals/Pain Pain Assessment: No/denies pain    Home Living Family/patient expects to be discharged to:: Private residence Living Arrangements: Spouse/significant other(Nicole [name], who identifies self as her "friend") Available Help at Discharge: Family;Personal care attendant;Available 24 hours/day Type of Home: House Home Access: Stairs to enter Entrance Stairs-Rails: Psychiatric nurse of Steps: 4 Home Layout: One level Home Equipment: Walker - 2 wheels;Cane - single point;Shower seat Additional Comments: Nicole [name], stays regularly.    Prior Function Level of Independence: Needs assistance   Gait / Transfers Assistance Needed: Pt told PT that she was using RW for walking  ADL's / Homemaking Assistance Needed: Pt able to perform BADLs with assist  as needed. Family/friends/aide do IADLs  Comments: Nicole [name] indicated she has someone with her around th eclock     Hand Dominance   Dominant Hand: Right    Extremity/Trunk Assessment   Upper Extremity Assessment Upper Extremity Assessment: Defer to OT evaluation    Lower Extremity Assessment Lower Extremity Assessment: Generalized weakness       Communication   Communication: No  difficulties  Cognition Arousal/Alertness: Awake/alert Behavior During Therapy: WFL for tasks assessed/performed Overall Cognitive Status: History of cognitive impairments - at baseline                                        General Comments General comments (skin integrity, edema, etc.): O2 sats decr with wlking on room air to 85%, restarted supplemental O2 at 2-3 L and sats incr to 95% or greater    Exercises     Assessment/Plan    PT Assessment Patient needs continued PT services  PT Problem List Decreased strength;Decreased activity tolerance;Decreased balance;Decreased mobility;Decreased coordination;Decreased cognition;Decreased knowledge of use of DME;Decreased safety awareness;Decreased knowledge of precautions;Cardiopulmonary status limiting activity       PT Treatment Interventions DME instruction;Gait training;Stair training;Functional mobility training;Therapeutic activities;Therapeutic exercise;Balance training;Neuromuscular re-education;Cognitive remediation;Patient/family education    PT Goals (Current goals can be found in the Care Plan section)  Acute Rehab PT Goals Patient Stated Goal: did not state, but was agreeable to amb PT Goal Formulation: Patient unable to participate in goal setting Time For Goal Achievement: 02/25/18 Potential to Achieve Goals: Good    Frequency Min 3X/week   Barriers to discharge        Co-evaluation               AM-PAC PT "6 Clicks" Daily Activity  Outcome Measure Difficulty turning over in bed (including adjusting bedclothes, sheets and blankets)?: A Lot Difficulty moving from lying on back to sitting on the side of the bed? : Unable Difficulty sitting down on and standing up from a chair with arms (e.g., wheelchair, bedside commode, etc,.)?: Unable Help needed moving to and from a bed to chair (including a wheelchair)?: A Little Help needed walking in hospital room?: A Little Help needed climbing 3-5  steps with a railing? : A Lot 6 Click Score: 12    End of Session Equipment Utilized During Treatment: Gait belt Activity Tolerance: Patient tolerated treatment well Patient left: in bed;with call bell/phone within reach;with bed alarm set;with restraints reapplied   PT Visit Diagnosis: Unsteadiness on feet (R26.81);Other abnormalities of gait and mobility (R26.89);Other (comment)(decr functional capacity)    Time: [id number] PT Time Calculation (min) (ACUTE ONLY): 28 min   Charges:   PT Evaluation $PT Eval Moderate Complexity: 1 Mod PT Treatments $Gait Training: 8-22 mins   PT G Codes:        Roney Marion, PT  Acute Rehabilitation Services Pager [phone] Office [phone]   Colletta [place] 02/11/2018, 4:26 PM

## 2018-02-11 NOTE — Progress Notes (Signed)
Progress Note    Nicole [name]  HTD:[id number] DOB: [birth year]  DOA: 02/09/2018 PCP: [name], DO    Brief Narrative:    Medical records reviewed and are as summarized below:  Nicole [name] is an 84 y.o. female with medical history significant of vascular dementia, diastolic Heart failure, A fib, CKD stage III, cr range 1.1--1.2, last admission 02-14-2034 for Diastolic heart failure exacerbation. She presents complaining of worsening SON for last few days. She report SOB for last 3-4 months, worse lately. She is feeling weak and tired. She report dry cough, unable to bring phlegm. She denies chest pain. No weight gain, weight loss more than 50 pounds, unknown in what period of time.     Assessment/Plan:   Active Problems:   History of colon cancer, stage III   Thoracic aortic aneurysm without rupture (HCC)   Atrial fibrillation, chronic (HCC)   Type 2 diabetes mellitus with peripheral neuropathy (HCC)   Vascular dementia   Acute on chronic systolic heart failure (HCC)   Hypothyroidism due to acquired atrophy of thyroid   DNR (do not resuscitate)   Hypoxemia   Acute on chronic respiratory failure with hypoxemia (HCC)  Acute on chronic hypoxic respiratory Failure due to pleural effusion -large pleural effusion and diastolic HF exacerbation.  -s/p thoracentesis-- 1L removed-- appears to be transudative-- prob from her CHF but culture pending -wean to home O2 Guaifenesin for cough.   A fib, flutter;  Resume Cardizem at lower dose due to bradycardia Monitor on telemetry -not on anticoagulation due to bleeding risk  Hypokalemia -repleted  Acute diastolic Heart failure exacerbation.  Dyspnea, bilateral pleural effusion, elevated BNP.  D/c IV lasix as AKI  AKI -was given IV lasix during admission-- have stopped -continue to trend -patient also had a period of low BP which could have worsened ATN  Dementia with delirium  Stable.  -has care-givers at  home -PT eval pending -got haldol last PM and was very sleepy this AM and unable to participate in exam   Hypothyroidism;  Continue with synthroid.   HTN;  BP on low side -d/c hydralazine/metoprolol- adjust imdur/cardizem as needed  Adult failure to thrive Ongoing FTT, worsens with disease progression from dementia and CHF. Following with palliative care (per PCP)     Family Communication/Anticipated D/C date and plan/Code Status   DVT prophylaxis: scd Code Status: DNR  Family Communication: care giver at bedside-- daughter should be in later Disposition Plan: pending improvement   Medical Consultants:    IR    Subjective:   Sleepy this AM-- became confused last PM and got haldol   Objective:    Vitals:   02/10/18 1616 02/10/18 2023 02/11/18 0615 02/11/18 0925  BP: 103/61 (!) 94/50 124/63 138/81  Pulse: (!) 43 (!) 47 (!) 40 (!) 48  Resp: 18 12 20 16   Temp: (!) 97.5 F (36.4 C) 97.7 F (36.5 C) 97.6 F (36.4 C) 97.7 F (36.5 C)  TempSrc: Oral Oral Oral Oral  SpO2: 100% 100% 100% 100%  Weight:  50.5 kg (111 lb 5.3 oz)    Height:        Intake/Output Summary (Last 24 hours) at 02/11/2018 1543 Last data filed at 02/11/2018 1300 Gross per 24 hour  Intake 300 ml  Output 0 ml  Net 300 ml   Filed Weights   02/09/18 1830 02/09/18 2225 02/10/18 2023  Weight: 55.8 kg (123 lb 0.3 oz) 52.6 kg (115 lb 15.4 oz) 50.5  kg (111 lb 5.3 oz)    Exam: Sleepy-- will awaken to voice and answer few questions Diminished breath sounds-- still dull at bases but no increased work of breathing +BS, soft No LE edema   Data Reviewed:   I have personally reviewed following labs and imaging studies:  Labs: Labs show the following:   Basic Metabolic Panel: Recent Labs  Lab 02/09/18 1239 02/10/18 0824 02/11/18 0539  NA 143 145 143  K 3.7 3.2* 3.6  CL 102 102 101  CO2 30 34* 31  GLUCOSE 101* 98 61*  BUN 10 14 21   CREATININE 1.10* 1.51* 2.23*  CALCIUM 8.7*  8.7* 8.5*   GFR Estimated Creatinine Clearance: 15 mL/min (A) (by C-G formula based on SCr of 2.23 mg/dL (H)). Liver Function Tests: Recent Labs  Lab 02/10/18 0824  AST 16  ALT 7  ALKPHOS 65  BILITOT 0.7  PROT 6.6  ALBUMIN 2.7*   No results for input(s): LIPASE, AMYLASE in the last 168 hours. No results for input(s): AMMONIA in the last 168 hours. Coagulation profile Recent Labs  Lab 02/09/18 1239 02/10/18 0824  INR 1.13 1.17    CBC: Recent Labs  Lab 02/09/18 1239 02/10/18 0824 02/11/18 0539  WBC 5.0 5.6 7.5  HGB 11.2* 10.3* 11.7*  HCT 33.6* 30.7* 35.1*  MCV 81.4 82.1 82.0  PLT 200 211 183   Cardiac Enzymes: No results for input(s): CKTOTAL, CKMB, CKMBINDEX, TROPONINI in the last 168 hours. BNP (last 3 results) No results for input(s): PROBNP in the last 8760 hours. CBG: No results for input(s): GLUCAP in the last 168 hours. D-Dimer: No results for input(s): DDIMER in the last 72 hours. Hgb A1c: No results for input(s): HGBA1C in the last 72 hours. Lipid Profile: No results for input(s): CHOL, HDL, LDLCALC, TRIG, CHOLHDL, LDLDIRECT in the last 72 hours. Thyroid function studies: No results for input(s): TSH, T4TOTAL, T3FREE, THYROIDAB in the last 72 hours.  Invalid input(s): FREET3 Anemia work up: No results for input(s): VITAMINB12, FOLATE, FERRITIN, TIBC, IRON, RETICCTPCT in the last 72 hours. Sepsis Labs: Recent Labs  Lab 02/09/18 1239 02/10/18 0824 02/11/18 0539  WBC 5.0 5.6 7.5    Microbiology Recent Results (from the past 240 hour(s))  Gram stain     Status: None   Collection Time: 02/10/18  4:00 PM  Result Value Ref Range Status   Specimen Description PLEURAL RIGHT  Final   Special Requests NONE  Final   Gram Stain   Final    ABUNDANT WBC PRESENT, PREDOMINANTLY MONONUCLEAR NO ORGANISMS SEEN Performed at [facility], [address] [place], [place] [address]    Report Status 02/10/2018 FINAL  Final  Culture, body fluid-bottle      Status: None (Preliminary result)   Collection Time: 02/10/18  4:00 PM  Result Value Ref Range Status   Specimen Description PLEURAL RIGHT  Final   Special Requests NONE  Final   Culture   Final    NO GROWTH < 24 HOURS Performed at [facility], [place] [address] [place], [place] [address]    Report Status PENDING  Incomplete    Procedures and diagnostic studies:  Dg Chest 1 View  Result Date: 02/10/2018 CLINICAL DATA:  Status post thoracentesis. EXAM: CHEST  1 VIEW COMPARISON:  Chest x-ray dated 02/09/2018. FINDINGS: Improved aeration at the RIGHT lung base status post thoracentesis. No pneumothorax seen. Stable small LEFT pleural effusion and/or atelectasis. Stable cardiomegaly. IMPRESSION: Decreased RIGHT pleural effusion status  post thoracentesis. No pneumothorax seen. Electronically Signed   By: Franki Cabot M.D.   On: 02/10/2018 16:00   Ct Chest Wo Contrast  Result Date: 02/09/2018 CLINICAL DATA:  Shortness of breath EXAM: CT CHEST WITHOUT CONTRAST TECHNIQUE: Multidetector CT imaging of the chest was performed following the standard protocol without IV contrast. COMPARISON:  CTA chest 01/11/2017 FINDINGS: Cardiovascular: Severe cardiomegaly. There are coronary artery calcifications and moderate aortic atherosclerosis. The main pulmonary artery is enlarged, measuring 4.5 cm at the bifurcation. Unchanged 4.8 cm ascending thoracic aortic aneurysm. Mediastinum/Nodes: Visualized thyroid is normal. There is no mediastinal or axillary lymphadenopathy. Lungs/Pleura: Large right and small left pleural effusions with associated atelectasis. There is at least 50% collapse of the right lower lobe. Pleural effusions are larger than on the prior CT of 01/11/2017. There is otherwise no consolidation. No pulmonary edema. Upper Abdomen: The unenhanced appearance of the visible portions of the upper abdominal organs is normal. Musculoskeletal: No acute abnormality or bony spinal canal stenosis.  IMPRESSION: 1. Large right and small left pleural effusions with associated atelectasis. No other airspace disease. 2. Enlarged main pulmonary artery, consistent with pulmonary hypertension. 3. Unchanged 4.8 cm ascending thoracic aortic aneurysm. Recommend semi-annual imaging followup by CTA or MRA and referral to cardiothoracic surgery if not already obtained. 4. Cardiomegaly with coronary artery and aortic atherosclerosis (ICD10-I70.0). The above recommendation follows 2010 ACCF/AHA/AATS/ACR/ASA/SCA/SCAI/SIR/STS/SVM Guidelines for the Diagnosis and Management of Patients With Thoracic Aortic Disease. Circulation. 2010; 121: D322-G254 Electronically Signed   By: Ulyses Jarred M.D.   On: 02/09/2018 16:40   Ir Thoracentesis Asp Pleural Space W/img Guide  Result Date: 02/10/2018 INDICATION: Worsening SHOB x 3-4 months; hx diastolic CHF with last exacerbation 10/16/17. Small left pleural effusion and large right pleural effusion on imaging 02/09/18. Request for diagnostic and therapeutic thoracentesis. EXAM: ULTRASOUND GUIDED RIGHT THORACENTESIS MEDICATIONS: 10 mL 2% lidocaine. COMPLICATIONS: None immediate. PROCEDURE: An ultrasound guided thoracentesis was thoroughly discussed with the patient and questions answered. The benefits, risks, alternatives and complications were also discussed. The patient understands and wishes to proceed with the procedure. Written consent was obtained. Ultrasound was performed to localize and mark an adequate pocket of fluid in the right chest. The area was then prepped and draped in the normal sterile fashion. 2% Lidocaine was used for local anesthesia. Under ultrasound guidance a 6 Fr Safe-T-Centesis catheter was introduced. Thoracentesis was performed. The catheter was removed and a dressing applied. FINDINGS: A total of approximately 1.0L of serosanguinous fluid was removed. Samples were sent to the laboratory as requested by the clinical team. IMPRESSION: Successful ultrasound  guided right thoracentesis yielding 1.0L of pleural fluid. Read by Candiss Norse, PA-C Electronically Signed   By: Markus Daft M.D.   On: 02/10/2018 16:05    Medications:   . allopurinol  100 mg Oral Daily  . aspirin EC  81 mg Oral Daily  . diltiazem  240 mg Oral Daily  . donepezil  10 mg Oral QHS  . feeding supplement (ENSURE ENLIVE)  237 mL Oral BID BM  . ferrous sulfate  325 mg Oral BID WC  . guaiFENesin  600 mg Oral BID  . isosorbide mononitrate  30 mg Oral BID  . levothyroxine  25 mcg Oral QAC breakfast  . potassium chloride  20 mEq Oral Daily  . sertraline  50 mg Oral Daily  . sodium chloride flush  3 mL Intravenous Q12H   Continuous Infusions: . sodium chloride       LOS: 0 days  Geradine Girt  [facility]   *Please refer to [email or web address], password [email or web address] to get updated schedule on who will round on this patient, as hospitalists switch teams weekly. If 7PM-7AM, please contact night-coverage at [email or web address], password [email or web address] for any overnight needs.  02/11/2018, 3:43 PM

## 2018-02-11 NOTE — Progress Notes (Signed)
Text paged [facility] in regards to administering Cardizem PO. Dose was ordered for AM but was held due to decreased heart rate. Patient is Afib 76 with BP 148/82 at this time.

## 2018-02-11 NOTE — Progress Notes (Signed)
Bladder scan done-200cc of residual urine detected.

## 2018-02-12 ENCOUNTER — Inpatient Hospital Stay (HOSPITAL_COMMUNITY): Payer: Medicare HMO

## 2018-02-12 DIAGNOSIS — J9621 Acute and chronic respiratory failure with hypoxia: Secondary | ICD-10-CM

## 2018-02-12 LAB — CBC
HCT: 31.2 % — ABNORMAL LOW (ref 36.0–46.0)
HEMOGLOBIN: 10.6 g/dL — AB (ref 12.0–15.0)
MCH: 27.4 pg (ref 26.0–34.0)
MCHC: 34 g/dL (ref 30.0–36.0)
MCV: 80.6 fL (ref 78.0–100.0)
Platelets: 208 10*3/uL (ref 150–400)
RBC: 3.87 MIL/uL (ref 3.87–5.11)
RDW: 14.1 % (ref 11.5–15.5)
WBC: 8.3 10*3/uL (ref 4.0–10.5)

## 2018-02-12 LAB — BASIC METABOLIC PANEL
ANION GAP: 6 (ref 5–15)
BUN: 27 mg/dL — ABNORMAL HIGH (ref 8–23)
CHLORIDE: 105 mmol/L (ref 98–111)
CO2: 32 mmol/L (ref 22–32)
Calcium: 8.6 mg/dL — ABNORMAL LOW (ref 8.9–10.3)
Creatinine, Ser: 2.07 mg/dL — ABNORMAL HIGH (ref 0.44–1.00)
GFR calc non Af Amer: 21 mL/min — ABNORMAL LOW (ref >60)
GFR, EST AFRICAN AMERICAN: 24 mL/min — AB (ref >60)
Glucose, Bld: 111 mg/dL — ABNORMAL HIGH (ref 70–99)
POTASSIUM: 4.1 mmol/L (ref 3.5–5.1)
SODIUM: 143 mmol/L (ref 135–145)

## 2018-02-12 LAB — GLUCOSE, CAPILLARY: Glucose-Capillary: 133 mg/dL — ABNORMAL HIGH (ref 70–99)

## 2018-02-12 MED ORDER — METOPROLOL SUCCINATE ER 25 MG PO TB24
25.0000 mg | ORAL_TABLET | Freq: Every day | ORAL | Status: DC
  Administered 2018-02-12 – 2018-02-15 (×4): 25 mg via ORAL
  Filled 2018-02-12 (×4): qty 1

## 2018-02-12 MED ORDER — FUROSEMIDE 40 MG PO TABS
40.0000 mg | ORAL_TABLET | Freq: Every day | ORAL | Status: DC
  Administered 2018-02-13 – 2018-02-15 (×3): 40 mg via ORAL
  Filled 2018-02-12 (×4): qty 1

## 2018-02-12 NOTE — Progress Notes (Addendum)
Progress Note    ALEXYSS BALZARINI  YOV:[id number] DOB: 1930-08-15  DOA: 02/09/2018 PCP: [name], DO    Brief Narrative:    Medical records reviewed and are as summarized below:  KRISTEE ANGUS is an 84 y.o. female with medical history significant of vascular dementia, diastolic Heart failure, A fib, CKD stage III, cr range 1.1--1.2, last admission [id number] for Diastolic heart failure exacerbation. She presents complaining of worsening SOB for last few days. She is feeling weak and tired. She report dry cough, unable to bring phlegm. S/p thoracentesis-- 1L removed-- family is verifying home meds as given here resulting in bradycardia and AKI    Assessment/Plan:   Active Problems:   History of colon cancer, stage III   Thoracic aortic aneurysm without rupture (HCC)   Atrial fibrillation, chronic (HCC)   Type 2 diabetes mellitus with peripheral neuropathy (HCC)   Vascular dementia   Acute on chronic systolic heart failure ([place])   Hypothyroidism due to acquired atrophy of thyroid   DNR (do not resuscitate)   Hypoxemia   Acute on chronic respiratory failure with hypoxemia (HCC)  Acute on chronic hypoxic respiratory Failure due to pleural effusion -large pleural effusion and diastolic HF exacerbation.  -s/p thoracentesis-- 1L removed-- appears to be transudative-- prob from her CHF but culture NGTD -wean to home O2-- may need 3L with exertion and 2L at rest Guaifenesin for cough.  -repeat x ray to see how fast fluid is re-accumulating  A fib, flutter;  Resume Cardizem at lower dose due to bradycardia Monitor on telemetry metoprolol held as well -not on anticoagulation due to bleeding risk -have asked family to verify which medications patient was getting  Hypokalemia -repleted  Acute diastolic Heart failure exacerbation.  Dyspnea, bilateral pleural effusion, elevated BNP.  D/c IV lasix as AKI -asked family to verify if patient was getting lasix at home  (god-daughter gives medications)  AKI -was given IV lasix -d/c'd and PO lasix held -trending down -patient also had a period of low BP which could have worsened  Dementia with delirium  Stable.  -has care-givers at home -PT eval pending -got haldol x1 and was very sleepy-- will try to avoid if possible   Hypothyroidism;  Continue with synthroid.   HTN;  BP on low side -d/c hydralazine/metoprolol- adjust imdur/cardizem as needed  Adult failure to thrive Ongoing FTT, worsens with disease progression from dementia and CHF. Following with palliative care (per PCP)     Family Communication/Anticipated D/C date and plan/Code Status   DVT prophylaxis: scd Code Status: DNR  Family Communication: care giver/significan other at bedside-- was able to reach daughter on phone at work- [employer] [phone]) Disposition Plan: home in AM?  Medical Consultants:    IR    Subjective:   Not sure is she has shortness of breath or not   Objective:    Vitals:   02/11/18 1721 02/11/18 2110 02/12/18 0520 02/12/18 0814  BP: (!) 157/72 (!) 148/82 (!) 152/73 140/71  Pulse: 81 65 82 63  Resp: 18 16 16  (!) 22  Temp: 98.5 F (36.9 C) 98.6 F (37 C) 98.4 F (36.9 C) 98.3 F (36.8 C)  TempSrc: Oral Oral Oral Oral  SpO2: 100% 100% 93% 100%  Weight:      Height:        Intake/Output Summary (Last 24 hours) at 02/12/2018 1437 Last data filed at 02/12/2018 0945 Gross per 24 hour  Intake 420 ml  Output 0 ml  Net 420 ml   Filed Weights   02/09/18 1830 02/09/18 2225 02/10/18 2023  Weight: 55.8 kg (123 lb 0.3 oz) 52.6 kg (115 lb 15.4 oz) 50.5 kg (111 lb 5.3 oz)    Exam: Will awaken, NAD Pleasantly confused irr but rate controlled Diminished breath sounds but poor cooperation/effort   Data Reviewed:   I have personally reviewed following labs and imaging studies:  Labs: Labs show the following:   Basic Metabolic Panel: Recent Labs  Lab 02/09/18 1239 02/10/18 0824  02/11/18 0539 02/12/18 0447  NA 143 145 143 143  K 3.7 3.2* 3.6 4.1  CL 102 102 101 105  CO2 30 34* 31 32  GLUCOSE 101* 98 61* 111*  BUN 10 14 21  27*  CREATININE 1.10* 1.51* 2.23* 2.07*  CALCIUM 8.7* 8.7* 8.5* 8.6*   GFR Estimated Creatinine Clearance: 16.1 mL/min (A) (by C-G formula based on SCr of 2.07 mg/dL (H)). Liver Function Tests: Recent Labs  Lab 02/10/18 0824  AST 16  ALT 7  ALKPHOS 65  BILITOT 0.7  PROT 6.6  ALBUMIN 2.7*   No results for input(s): LIPASE, AMYLASE in the last 168 hours. No results for input(s): AMMONIA in the last 168 hours. Coagulation profile Recent Labs  Lab 02/09/18 1239 02/10/18 0824  INR 1.13 1.17    CBC: Recent Labs  Lab 02/09/18 1239 02/10/18 0824 02/11/18 0539 02/12/18 0447  WBC 5.0 5.6 7.5 8.3  HGB 11.2* 10.3* 11.7* 10.6*  HCT 33.6* 30.7* 35.1* 31.2*  MCV 81.4 82.1 82.0 80.6  PLT 200 211 183 208   Cardiac Enzymes: No results for input(s): CKTOTAL, CKMB, CKMBINDEX, TROPONINI in the last 168 hours. BNP (last 3 results) No results for input(s): PROBNP in the last 8760 hours. CBG: Recent Labs  Lab 02/11/18 1605 02/11/18 1842  GLUCAP 89 176*   D-Dimer: No results for input(s): DDIMER in the last 72 hours. Hgb A1c: No results for input(s): HGBA1C in the last 72 hours. Lipid Profile: No results for input(s): CHOL, HDL, LDLCALC, TRIG, CHOLHDL, LDLDIRECT in the last 72 hours. Thyroid function studies: No results for input(s): TSH, T4TOTAL, T3FREE, THYROIDAB in the last 72 hours.  Invalid input(s): FREET3 Anemia work up: No results for input(s): VITAMINB12, FOLATE, FERRITIN, TIBC, IRON, RETICCTPCT in the last 72 hours. Sepsis Labs: Recent Labs  Lab 02/09/18 1239 02/10/18 0824 02/11/18 0539 02/12/18 0447  WBC 5.0 5.6 7.5 8.3    Microbiology Recent Results (from the past 240 hour(s))  Gram stain     Status: None   Collection Time: 02/10/18  4:00 PM  Result Value Ref Range Status   Specimen Description  PLEURAL RIGHT  Final   Special Requests NONE  Final   Gram Stain   Final    ABUNDANT WBC PRESENT, PREDOMINANTLY MONONUCLEAR NO ORGANISMS SEEN Performed at [facility], [address] [place], [place] [address]    Report Status 02/10/2018 FINAL  Final  Culture, body fluid-bottle     Status: None (Preliminary result)   Collection Time: 02/10/18  4:00 PM  Result Value Ref Range Status   Specimen Description PLEURAL RIGHT  Final   Special Requests NONE  Final   Culture   Final    NO GROWTH < 24 HOURS Performed at [facility], [place] [address], [place], [place] [address]    Report Status PENDING  Incomplete    Procedures and diagnostic studies:  Dg Chest 1 View  Result Date: 02/10/2018 CLINICAL DATA:  Status  post thoracentesis. EXAM: CHEST  1 VIEW COMPARISON:  Chest x-ray dated 02/09/2018. FINDINGS: Improved aeration at the RIGHT lung base status post thoracentesis. No pneumothorax seen. Stable small LEFT pleural effusion and/or atelectasis. Stable cardiomegaly. IMPRESSION: Decreased RIGHT pleural effusion status post thoracentesis. No pneumothorax seen. Electronically Signed   By: Franki Cabot M.D.   On: 02/10/2018 16:00   Ir Thoracentesis Asp Pleural Space W/img Guide  Result Date: 02/10/2018 INDICATION: Worsening SHOB x 3-4 months; hx diastolic CHF with last exacerbation 10/16/17. Small left pleural effusion and large right pleural effusion on imaging 02/09/18. Request for diagnostic and therapeutic thoracentesis. EXAM: ULTRASOUND GUIDED RIGHT THORACENTESIS MEDICATIONS: 10 mL 2% lidocaine. COMPLICATIONS: None immediate. PROCEDURE: An ultrasound guided thoracentesis was thoroughly discussed with the patient and questions answered. The benefits, risks, alternatives and complications were also discussed. The patient understands and wishes to proceed with the procedure. Written consent was obtained. Ultrasound was performed to localize and mark an adequate pocket of fluid in the  right chest. The area was then prepped and draped in the normal sterile fashion. 2% Lidocaine was used for local anesthesia. Under ultrasound guidance a 6 Fr Safe-T-Centesis catheter was introduced. Thoracentesis was performed. The catheter was removed and a dressing applied. FINDINGS: A total of approximately 1.0L of serosanguinous fluid was removed. Samples were sent to the laboratory as requested by the clinical team. IMPRESSION: Successful ultrasound guided right thoracentesis yielding 1.0L of pleural fluid. Read by Candiss Norse, PA-C Electronically Signed   By: Markus Daft M.D.   On: 02/10/2018 16:05    Medications:   . allopurinol  100 mg Oral Daily  . aspirin EC  81 mg Oral Daily  . diltiazem  240 mg Oral Daily  . donepezil  10 mg Oral QHS  . feeding supplement (ENSURE ENLIVE)  237 mL Oral BID BM  . ferrous sulfate  325 mg Oral BID WC  . guaiFENesin  600 mg Oral BID  . isosorbide mononitrate  30 mg Oral BID  . levothyroxine  25 mcg Oral QAC breakfast  . potassium chloride  20 mEq Oral Daily  . sertraline  50 mg Oral Daily  . sodium chloride flush  3 mL Intravenous Q12H   Continuous Infusions: . sodium chloride       LOS: 1 day   Geradine Girt  [facility]   *Please refer to [email or web address], password [email or web address] to get updated schedule on who will round on this patient, as hospitalists switch teams weekly. If 7PM-7AM, please contact night-coverage at [email or web address], password [email or web address] for any overnight needs.  02/12/2018, 2:37 PM

## 2018-02-12 NOTE — Evaluation (Signed)
Occupational Therapy Evaluation Patient Details Name: Nicole [name] MRN: [id number] DOB: July 06, 1931 Today's Date: 02/12/2018    History of Present Illness Nicole [name] is a 84 y.o. female with medical history significant of vascular dementia, diastolic Heart failure, A fib, CKD stage III, cr range 1.1--1.2, last admission [id number] for Diastolic heart failure exacerbation. She presents complaining of worsening SOB for last few days. She report SOB for last 3-4 months, worse lately. She is feeling weak and tired.   Clinical Impression   Pt with decline in function and safety with ADLs and ADL mobility with decreased strength, balance and endurance. Pt with hx of cognitive impairments/dementia. PTA, pt lived at home with family and has personal care attendant for daily ADLs prn,  IADLs and used RW for mobility per pt report. Pt would benefit from acute OT services to address impairments to maximize level of function and safety    Follow Up Recommendations  Home health OT;Supervision/Assistance - 24 hour(may need SNF if unable to have increased and consistent assist at home)    Equipment Recommendations  3 in 1 bedside commode    Recommendations for Other Services       Precautions / Restrictions Precautions Precautions: Fall Precaution Comments: sitter in room Restrictions Weight Bearing Restrictions: No      Mobility Bed Mobility Overal bed mobility: Needs Assistance Bed Mobility: Supine to Sit;Sit to Supine     Supine to sit: Mod assist Sit to supine: Mod assist   General bed mobility comments: assist to elevate trunk  Transfers Overall transfer level: Needs assistance               General transfer comment: pt declined due to lunch tray arriving. Per PT note pt is mod A for transefrs using RW    Balance Overall balance assessment: Needs assistance Sitting-balance support: No upper extremity supported;Feet supported Sitting balance-Leahy Scale: Fair                                      ADL either performed or assessed with clinical judgement   ADL Overall ADL's : Needs assistance/impaired Eating/Feeding: Set up;Sitting   Grooming: Wash/dry hands;Wash/dry face;Sitting;Min guard   Upper Body Bathing: Min guard;Sitting   Lower Body Bathing: Maximal assistance   Upper Body Dressing : Min guard;Sitting   Lower Body Dressing: Maximal assistance     Toilet Transfer Details (indicate cue type and reason): NT           General ADL Comments: pt declined transfers/OOB activity due to lunch tray arrival     Vision Baseline Vision/History: Wears glasses Wears Glasses: Reading only Patient Visual Report: No change from baseline       Perception     Praxis      Pertinent Vitals/Pain Pain Assessment: No/denies pain     Hand Dominance Right   Extremity/Trunk Assessment Upper Extremity Assessment Upper Extremity Assessment: Generalized weakness   Lower Extremity Assessment Lower Extremity Assessment: Defer to PT evaluation       Communication Communication Communication: No difficulties   Cognition Arousal/Alertness: Lethargic;Suspect due to medications Behavior During Therapy: [facility] for tasks assessed/performed Overall Cognitive Status: History of cognitive impairments - at baseline                                     General  Comments       Exercises     Shoulder Instructions      Home Living Family/patient expects to be discharged to:: Private residence Living Arrangements: Spouse/significant other Available Help at Discharge: Family;Personal care attendant;Available 24 hours/day Type of Home: House Home Access: Stairs to enter [employer] of Steps: 4 Entrance Stairs-Rails: Right;Left Home Layout: One level     Bathroom Shower/Tub: Tub/shower unit;Curtain   Bathroom Toilet: Handicapped height     Home Equipment: Environmental consultant - 2 wheels;Cane - single point;Shower  seat   Additional Comments: Jess Barters, stays regularly.      Prior Functioning/Environment Level of Independence: Needs assistance  Gait / Transfers Assistance Needed: reports that she was using RW for walking ADL's / Homemaking Assistance Needed: Pt able to perform BADLs with assist as needed. Family/friends/aide do IADLs            OT Problem List: Decreased strength;Decreased activity tolerance;Decreased cognition;Decreased knowledge of use of DME or AE;Impaired balance (sitting and/or standing);Decreased safety awareness;Decreased knowledge of precautions      OT Treatment/Interventions: Self-care/ADL training;Therapeutic exercise;DME and/or AE instruction;Neuromuscular education;Therapeutic activities;Patient/family education    OT Goals(Current goals can be found in the care plan section) Acute Rehab OT Goals Patient Stated Goal: did not state, but was agreeable to amb OT Goal Formulation: With patient Time For Goal Achievement: 02/26/18 Potential to Achieve Goals: Good ADL Goals Pt Will Perform Grooming: with set-up;with supervision;sitting;with caregiver independent in assisting Pt Will Perform Upper Body Bathing: with set-up;with supervision;sitting;with caregiver independent in assisting Pt Will Perform Upper Body Dressing: with set-up;with supervision;sitting;with caregiver independent in assisting Pt Will Transfer to Toilet: with min assist;ambulating;stand pivot transfer;regular height toilet;grab bars;bedside commode Pt Will Perform Toileting - Clothing Manipulation and hygiene: with mod assist;with min assist;sit to/from stand Additional ADL Goal #1: Pt will complete bed mobility with min A to sit EOB in prep for ADL transfers  OT Frequency: Min 2X/week   Barriers to D/C: Other (comment)  may need SNF if unable to have increased and consistent assist at home)       Co-evaluation              AM-PAC PT "6 Clicks" Daily Activity     Outcome Measure  Help from another person eating meals?: None Help from another person taking care of personal grooming?: A Little Help from another person toileting, which includes using toliet, bedpan, or urinal?: A Lot Help from another person bathing (including washing, rinsing, drying)?: A Lot Help from another person to put on and taking off regular upper body clothing?: A Little Help from another person to put on and taking off regular lower body clothing?: A Lot 6 Click Score: 16   End of Session    Activity Tolerance: Patient limited by fatigue Patient left: in bed;with call bell/phone within reach;with nursing/sitter in room;with bed alarm set  OT Visit Diagnosis: Unsteadiness on feet (R26.81);Other abnormalities of gait and mobility (R26.89);Muscle weakness (generalized) (M62.81);History of falling (Z91.81);Other symptoms and signs involving cognitive function                Time: 7628-3151 OT Time Calculation (min): 27 min Charges:  OT Evaluation $OT Eval Moderate Complexity: 1 Mod G-Codes: OT G-codes **NOT FOR INPATIENT CLASS** Functional Assessment Tool Used: AM-PAC 6 Clicks Daily Activity     Britt Bottom 02/12/2018, 2:06 PM

## 2018-02-12 NOTE — Progress Notes (Signed)
I called [facility] to clarify the prescription for Dilt[facility]em. Per [facility] record, the prescription is diltiazem CD 180 mg daily, but the most recent refill was in April this year.  Maryanna Shape, PharmD, BCPS, BCPPS Clinical Pharmacist  Pager: [phone]

## 2018-02-12 NOTE — Progress Notes (Signed)
Spoke with god-daughter who handles medications for the patient-- Patient IS on lasix and toprolol xl and has been getting.  Does NOT take Cardizem any longer.  Fills all [facility] at [facility] on [address].  Eulogio Bear DO

## 2018-02-13 DIAGNOSIS — I5023 Acute on chronic systolic (congestive) heart failure: Secondary | ICD-10-CM

## 2018-02-13 LAB — CBC
HEMATOCRIT: 28.2 % — AB (ref 36.0–46.0)
HEMOGLOBIN: 9.6 g/dL — AB (ref 12.0–15.0)
MCH: 27.4 pg (ref 26.0–34.0)
MCHC: 34 g/dL (ref 30.0–36.0)
MCV: 80.3 fL (ref 78.0–100.0)
Platelets: 192 10*3/uL (ref 150–400)
RBC: 3.51 MIL/uL — AB (ref 3.87–5.11)
RDW: 13.9 % (ref 11.5–15.5)
WBC: 7.5 10*3/uL (ref 4.0–10.5)

## 2018-02-13 LAB — BASIC METABOLIC PANEL
ANION GAP: 7 (ref 5–15)
BUN: 36 mg/dL — ABNORMAL HIGH (ref 8–23)
CHLORIDE: 102 mmol/L (ref 98–111)
CO2: 32 mmol/L (ref 22–32)
Calcium: 8.4 mg/dL — ABNORMAL LOW (ref 8.9–10.3)
Creatinine, Ser: 2.26 mg/dL — ABNORMAL HIGH (ref 0.44–1.00)
GFR calc non Af Amer: 19 mL/min — ABNORMAL LOW (ref >60)
GFR, EST AFRICAN AMERICAN: 22 mL/min — AB (ref >60)
Glucose, Bld: 109 mg/dL — ABNORMAL HIGH (ref 70–99)
POTASSIUM: 4.7 mmol/L (ref 3.5–5.1)
SODIUM: 141 mmol/L (ref 135–145)

## 2018-02-13 LAB — GLUCOSE, CAPILLARY
GLUCOSE-CAPILLARY: 106 mg/dL — AB (ref 70–99)
GLUCOSE-CAPILLARY: 191 mg/dL — AB (ref 70–99)
Glucose-Capillary: 88 mg/dL (ref 70–99)
Glucose-Capillary: 139 mg/dL — ABNORMAL HIGH (ref 70–99)

## 2018-02-13 NOTE — Progress Notes (Signed)
Occupational Therapy Treatment Patient Details Name: Nicole [name] MRN: [id number] DOB: 1931-05-09 Today's Date: 02/13/2018    History of present illness Nicole [name] is a 84 y.o. female with medical history significant of vascular dementia, diastolic Heart failure, A fib, CKD stage III, cr range 1.1--1.2, last admission [phone] for Diastolic heart failure exacerbation. She presents complaining of worsening SOB for last few days. She report SOB for last 3-4 months, worse lately. She is feeling weak and tired.   OT comments  Pt making progress with functional goals. Min - mod A with bed mobility to sit EOB. Pt sat EOB x 15 minutes and ate 50% of breakfast and required mod - min A for balance/support due to Poor sitting balance/posterior lean. mod A with transfers to recliner and back to EOB. Pt very pleasant and cooperative, OT will continue to follow acutely  Follow Up Recommendations  Home health OT;Supervision/Assistance - 24 hour(may need SNF if unable to have 24 hour assist at home )    Equipment Recommendations  3 in 1 bedside commode    Recommendations for Other Services      Precautions / Restrictions Precautions Precautions: Fall Restrictions Weight Bearing Restrictions: No       Mobility Bed Mobility Overal bed mobility: Needs Assistance Bed Mobility: Supine to Sit;Sit to Supine     Supine to sit: Min assist Sit to supine: Mod assist   General bed mobility comments: assist to elevate trunk and with LEs back onto bed  Transfers Overall transfer level: Needs assistance   Transfers: Sit to/from Stand;Stand Pivot Transfers Sit to Stand: Mod assist Stand pivot transfers: Mod assist            Balance Overall balance assessment: Needs assistance Sitting-balance support: No upper extremity supported;Feet supported Sitting balance-Leahy Scale: Fair     Standing balance support: Single extremity supported;Bilateral upper extremity supported;During  functional activity Standing balance-Leahy Scale: Poor                             ADL either performed or assessed with clinical judgement   ADL Overall ADL's : Needs assistance/impaired Eating/Feeding: Supervision/ safety;Set up;Sitting Eating/Feeding Details (indicate cue type and reason): pt with posterior lean and required min A for balance/suppport sitting EOB Grooming: Wash/dry hands;Wash/dry face;Sitting;Min guard Grooming Details (indicate cue type and reason): pt with posterior lean and required min A for balance/suppport sitting EOB                 Toilet Transfer: Moderate assistance;Stand-pivot Toilet Transfer Details (indicate cue type and reason): simulated bed - recliner - bed Toileting- Clothing Manipulation and Hygiene: Total assistance         General ADL Comments: pt sat EOB to eat 50% of breakfast     Vision Baseline Vision/History: Wears glasses Wears Glasses: Reading only Patient Visual Report: No change from baseline     Perception     Praxis      Cognition Arousal/Alertness: Awake/alert Behavior During Therapy: WFL for tasks assessed/performed Overall Cognitive Status: History of cognitive impairments - at baseline                                          Exercises     Shoulder Instructions       General Comments      Pertinent Vitals/ Pain  Pain Assessment: No/denies pain  Home Living                                          Prior Functioning/Environment              Frequency  Min 2X/week        Progress Toward Goals  OT Goals(current goals can now be found in the care plan section)  Progress towards OT goals: Progressing toward goals     Plan Discharge plan remains appropriate    Co-evaluation                 AM-PAC PT "6 Clicks" Daily Activity     Outcome Measure   Help from another person eating meals?: None Help from another person taking  care of personal grooming?: A Little Help from another person toileting, which includes using toliet, bedpan, or urinal?: A Lot Help from another person bathing (including washing, rinsing, drying)?: A Lot Help from another person to put on and taking off regular upper body clothing?: A Little Help from another person to put on and taking off regular lower body clothing?: A Lot 6 Click Score: 16    End of Session Equipment Utilized During Treatment: Gait belt  OT Visit Diagnosis: Unsteadiness on feet (R26.81);Other abnormalities of gait and mobility (R26.89);Muscle weakness (generalized) (M62.81);History of falling (Z91.81);Other symptoms and signs involving cognitive function   Activity Tolerance Patient limited by fatigue   Patient Left in bed;with call bell/phone within reach;with bed alarm set   Nurse Communication      Functional Assessment Tool Used: AM-PAC 6 Clicks Daily Activity   Time: 9163-8466 OT Time Calculation (min): 26 min  Charges: OT General Charges $OT Visit: 1 Visit OT Treatments $Self Care/Home Management : 8-22 mins $Therapeutic Activity: 8-22 mins     Britt Bottom 02/13/2018, 11:12 AM

## 2018-02-13 NOTE — Progress Notes (Signed)
[facility]                                                                              Patient Demographics  Nicole [name], is a 84 y.o. female, DOB - July 10, 1931, MAU:[id number]  Admit date - 02/09/2018   Admitting Physician Elmarie Shiley, MD  Outpatient Primary MD for the patient is [name], DO  Outpatient specialists:   LOS - 2  days   Medical records reviewed and are as summarized below:    Chief Complaint  Patient presents with  . Shortness of Breath       Brief summary   Nicole [name] is an 84 y.o. female with medical history significant ofvascular dementia, diastolic Heart failure, A fib, CKD stage III, cr range 1.1--1.2, last admission [id number] for Diastolic heart failure exacerbation. She presents complaining of worsening SOB for last few days. She is feeling weak and tired. She report dry cough, unable to bring phlegm. S/p thoracentesis-- 1L removed.   Assessment & Plan   Principal problem Acute on chronic hypoxic respiratory failure Pleural effusion, right, acute on chronic diastolic CHF -Large pleural effusion, diastolic CHF exacerbation - status post thoracentesis on 7/22, 1 L removed, appears to be transudative -IV Lasix discontinued, unable to restart Lasix, creatinine trending up -O2 sats 100% on room air  Atrial fibrillation - Dr Eliseo Squires verified medications with patient's daughter, patient is on Toprol-XL but does not take Cardizem any longer -Continue beta-blocker -Not on anti-correlation due to bleeding risk  Acute on chronic diastolic CHF exacerbation - Presented with dyspnea, bilateral pleural effusion with elevated BNP -Patient was initially placed on IV Lasix, discontinued due to creatinine trending up  Acute kidney injury -Likely due to Lasix IV, currently held -Patient had an episode of low BP which could have worsened the renal function.  Dementia, adult failure to thrive -PT evaluation recommended home  health PT OT, 24-hour supervision if not available then skilled nursing facility -Outpatient follow-up with palliative care  Hypothyroidism -Continue Synthroid  Essential hypertension -Continue metoprolol, indoor -Lasix on hold, Cardizem discontinued   Code Status: DNR DVT Prophylaxis:  SCD's Family Communication: Discussed in detail with the patient, all imaging results, lab results explained to the patient   Disposition Plan: Possible DC in a.m. if creatinine improving Time Spent in minutes  25 minutes  Procedures:  Thoracentesis  Consultants:   Interventional radiology  Antimicrobials:      Medications  Scheduled Meds: . allopurinol  100 mg Oral Daily  . aspirin EC  81 mg Oral Daily  . donepezil  10 mg Oral QHS  . feeding supplement (ENSURE ENLIVE)  237 mL Oral BID BM  . ferrous sulfate  325 mg Oral BID WC  . furosemide  40 mg Oral Daily  . guaiFENesin  600 mg Oral BID  . isosorbide mononitrate  30 mg Oral BID  . levothyroxine  25 mcg Oral QAC breakfast  . metoprolol succinate  25 mg Oral Daily  . potassium chloride  20 mEq Oral Daily  . sertraline  50 mg Oral Daily  . sodium chloride flush  3 mL Intravenous Q12H  Continuous Infusions: . sodium chloride     PRN Meds:.sodium chloride, acetaminophen, bisacodyl, ipratropium, lidocaine, ondansetron **OR** ondansetron (ZOFRAN) IV, sodium chloride flush   Antibiotics   Anti-infectives (From admission, onward)   None        Subjective:   Symiah Nowotny was seen and examined today.  Difficult to obtain review of system from the patient due to dementia, does not appear to be short of breath, any pain.  Appears comfortable.  No acute events overnight.  No fevers or chills.  Objective:   Vitals:   02/12/18 0814 02/12/18 1625 02/12/18 2049 02/13/18 0607  BP: 140/71 (!) 110/58 116/69 (!) 145/76  Pulse: 63 65 65 60  Resp: (!) 22 20 18 16   Temp: 98.3 F (36.8 C) 97.9 F (36.6 C) 98.6 F (37 C) 98.1 F  (36.7 C)  TempSrc: Oral Oral Oral Oral  SpO2: 100% 100% 98% 100%  Weight:   50.5 kg (111 lb 5.3 oz)   Height:        Intake/Output Summary (Last 24 hours) at 02/13/2018 1122 Last data filed at 02/13/2018 0900 Gross per 24 hour  Intake 240 ml  Output 0 ml  Net 240 ml     Wt Readings from Last 3 Encounters:  02/12/18 50.5 kg (111 lb 5.3 oz)  12/25/17 56.2 kg (124 lb)  11/21/17 58.5 kg (129 lb)     Exam  General: Alert and awake, dementia, NAD  Eyes:   HEENT:    Cardiovascular: Irregularly irregular  Respiratory: Decreased breath sound at the bases  Gastrointestinal: Soft, nontender, nondistended, + bowel sounds  Ext: no pedal edema bilaterally  Neuro: no new deficits  Musculoskeletal: No digital cyanosis, clubbing  Skin: No rashes  Psych: dementia   Data Reviewed:  I have personally reviewed following labs and imaging studies  Micro Results Recent Results (from the past 240 hour(s))  Gram stain     Status: None   Collection Time: 02/10/18  4:00 PM  Result Value Ref Range Status   Specimen Description PLEURAL RIGHT  Final   Special Requests NONE  Final   Gram Stain   Final    ABUNDANT WBC PRESENT, PREDOMINANTLY MONONUCLEAR NO ORGANISMS SEEN Performed at [facility], [address] [place], [place] [address]    Report Status 02/10/2018 FINAL  Final  Culture, body fluid-bottle     Status: None (Preliminary result)   Collection Time: 02/10/18  4:00 PM  Result Value Ref Range Status   Specimen Description PLEURAL RIGHT  Final   Special Requests NONE  Final   Culture   Final    NO GROWTH 2 DAYS Performed at [facility], [place] [address], [place], [place] [address]    Report Status PENDING  Incomplete    Radiology Reports Dg Chest 1 View  Result Date: 02/10/2018 CLINICAL DATA:  Status post thoracentesis. EXAM: CHEST  1 VIEW COMPARISON:  Chest x-ray dated 02/09/2018. FINDINGS: Improved aeration at the RIGHT lung base status post  thoracentesis. No pneumothorax seen. Stable small LEFT pleural effusion and/or atelectasis. Stable cardiomegaly. IMPRESSION: Decreased RIGHT pleural effusion status post thoracentesis. No pneumothorax seen. Electronically Signed   By: Franki Cabot M.D.   On: 02/10/2018 16:00   Dg Chest 2 View  Result Date: 02/09/2018 CLINICAL DATA:  Shortness of breath EXAM: CHEST - 2 VIEW COMPARISON:  09/11/2017 FINDINGS: Stable cardiomegaly with vascular congestion and mild to moderate pleural effusions. Associated bibasilar atelectasis/partial consolidation. Upper lungs remain clear. No  pneumothorax. Trachea is midline. Aorta is atherosclerotic. Degenerative changes of the spine with diffuse osteopenia. IMPRESSION: Cardiomegaly with vascular congestion and pleural effusions. Bibasilar compressive atelectasis/partial consolidation. Electronically Signed   By: Jerilynn Mages.  Shick M.D.   On: 02/09/2018 13:13   Ct Chest Wo Contrast  Result Date: 02/09/2018 CLINICAL DATA:  Shortness of breath EXAM: CT CHEST WITHOUT CONTRAST TECHNIQUE: Multidetector CT imaging of the chest was performed following the standard protocol without IV contrast. COMPARISON:  CTA chest 01/11/2017 FINDINGS: Cardiovascular: Severe cardiomegaly. There are coronary artery calcifications and moderate aortic atherosclerosis. The main pulmonary artery is enlarged, measuring 4.5 cm at the bifurcation. Unchanged 4.8 cm ascending thoracic aortic aneurysm. Mediastinum/Nodes: Visualized thyroid is normal. There is no mediastinal or axillary lymphadenopathy. Lungs/Pleura: Large right and small left pleural effusions with associated atelectasis. There is at least 50% collapse of the right lower lobe. Pleural effusions are larger than on the prior CT of 01/11/2017. There is otherwise no consolidation. No pulmonary edema. Upper Abdomen: The unenhanced appearance of the visible portions of the upper abdominal organs is normal. Musculoskeletal: No acute abnormality or bony  spinal canal stenosis. IMPRESSION: 1. Large right and small left pleural effusions with associated atelectasis. No other airspace disease. 2. Enlarged main pulmonary artery, consistent with pulmonary hypertension. 3. Unchanged 4.8 cm ascending thoracic aortic aneurysm. Recommend semi-annual imaging followup by CTA or MRA and referral to cardiothoracic surgery if not already obtained. 4. Cardiomegaly with coronary artery and aortic atherosclerosis (ICD10-I70.0). The above recommendation follows 2010 ACCF/AHA/AATS/ACR/ASA/SCA/SCAI/SIR/STS/SVM Guidelines for the Diagnosis and Management of Patients With Thoracic Aortic Disease. Circulation. 2010; 121: J188-C166 Electronically Signed   By: Ulyses Jarred M.D.   On: 02/09/2018 16:40   Dg Chest Port 1 View  Result Date: 02/12/2018 CLINICAL DATA:  Follow-up right pleural effusion. The patient underwent thoracentesis yesterday. EXAM: PORTABLE CHEST 1 VIEW COMPARISON:  Post thoracentesis radiograph of February 10, 2018 FINDINGS: There has been interval reaccumulation of a moderate amount of pleural fluid on the right. A trace of pleural fluid on the left is present and stable. There is no pneumothorax. The cardiac silhouette is enlarged. The central pulmonary vascularity is mildly prominent but stable. There is calcification in the wall of the aortic arch. The bony thorax exhibits no acute abnormality. IMPRESSION: Interval re-accumulation of a moderate amount of pleural fluid on the right. Electronically Signed   By: David  Martinique M.D.   On: 02/12/2018 15:41   Ir Thoracentesis Asp Pleural Space W/img Guide  Result Date: 02/10/2018 INDICATION: Worsening SHOB x 3-4 months; hx diastolic CHF with last exacerbation 10/16/17. Small left pleural effusion and large right pleural effusion on imaging 02/09/18. Request for diagnostic and therapeutic thoracentesis. EXAM: ULTRASOUND GUIDED RIGHT THORACENTESIS MEDICATIONS: 10 mL 2% lidocaine. COMPLICATIONS: None immediate. PROCEDURE: An  ultrasound guided thoracentesis was thoroughly discussed with the patient and questions answered. The benefits, risks, alternatives and complications were also discussed. The patient understands and wishes to proceed with the procedure. Written consent was obtained. Ultrasound was performed to localize and mark an adequate pocket of fluid in the right chest. The area was then prepped and draped in the normal sterile fashion. 2% Lidocaine was used for local anesthesia. Under ultrasound guidance a 6 Fr Safe-T-Centesis catheter was introduced. Thoracentesis was performed. The catheter was removed and a dressing applied. FINDINGS: A total of approximately 1.0L of serosanguinous fluid was removed. Samples were sent to the laboratory as requested by the clinical team. IMPRESSION: Successful ultrasound guided right thoracentesis yielding 1.0L of pleural  fluid. Read by Candiss Norse, PA-C Electronically Signed   By: Markus Daft M.D.   On: 02/10/2018 16:05    Lab Data:  CBC: Recent Labs  Lab 02/09/18 1239 02/10/18 0824 02/11/18 0539 02/12/18 0447 02/13/18 0424  WBC 5.0 5.6 7.5 8.3 7.5  HGB 11.2* 10.3* 11.7* 10.6* 9.6*  HCT 33.6* 30.7* 35.1* 31.2* 28.2*  MCV 81.4 82.1 82.0 80.6 80.3  PLT 200 211 183 208 315   Basic Metabolic Panel: Recent Labs  Lab 02/09/18 1239 02/10/18 0824 02/11/18 0539 02/12/18 0447 02/13/18 0424  NA 143 145 143 143 141  K 3.7 3.2* 3.6 4.1 4.7  CL 102 102 101 105 102  CO2 30 34* 31 32 32  GLUCOSE 101* 98 61* 111* 109*  BUN 10 14 21  27* 36*  CREATININE 1.10* 1.51* 2.23* 2.07* 2.26*  CALCIUM 8.7* 8.7* 8.5* 8.6* 8.4*   GFR: Estimated Creatinine Clearance: 14.8 mL/min (A) (by C-G formula based on SCr of 2.26 mg/dL (H)). Liver Function Tests: Recent Labs  Lab 02/10/18 0824  AST 16  ALT 7  ALKPHOS 65  BILITOT 0.7  PROT 6.6  ALBUMIN 2.7*   No results for input(s): LIPASE, AMYLASE in the last 168 hours. No results for input(s): AMMONIA in the last 168  hours. Coagulation Profile: Recent Labs  Lab 02/09/18 1239 02/10/18 0824  INR 1.13 1.17   Cardiac Enzymes: No results for input(s): CKTOTAL, CKMB, CKMBINDEX, TROPONINI in the last 168 hours. BNP (last 3 results) No results for input(s): PROBNP in the last 8760 hours. HbA1C: No results for input(s): HGBA1C in the last 72 hours. CBG: Recent Labs  Lab 02/11/18 1605 02/11/18 1842 02/12/18 1635 02/13/18 0043 02/13/18 0610  GLUCAP 89 176* 133* 106* 88   Lipid Profile: No results for input(s): CHOL, HDL, LDLCALC, TRIG, CHOLHDL, LDLDIRECT in the last 72 hours. Thyroid Function Tests: No results for input(s): TSH, T4TOTAL, FREET4, T3FREE, THYROIDAB in the last 72 hours. Anemia Panel: No results for input(s): VITAMINB12, FOLATE, FERRITIN, TIBC, IRON, RETICCTPCT in the last 72 hours. Urine analysis:    Component Value Date/Time   COLORURINE AMBER (A) 05/23/2017 0156   APPEARANCEUR HAZY (A) 05/23/2017 0156   LABSPEC 1.026 05/23/2017 0156   PHURINE 5.0 05/23/2017 0156   GLUCOSEU NEGATIVE 05/23/2017 0156   HGBUR NEGATIVE 05/23/2017 0156   BILIRUBINUR SMALL (A) 05/23/2017 0156   KETONESUR NEGATIVE 05/23/2017 0156   PROTEINUR >=300 (A) 05/23/2017 0156   UROBILINOGEN 2.0 (H) 04/13/2015 1844   NITRITE NEGATIVE 05/23/2017 0156   LEUKOCYTESUR NEGATIVE 05/23/2017 0156     Billy Turvey M.D. [facility] 02/13/2018, 11:22 AM  Pager: [phone] Between 7am to 7pm - call Pager - [phone]  After 7pm go to [email or web address] - password [email or web address]  Call night coverage person covering after 7pm

## 2018-02-13 NOTE — Progress Notes (Signed)
Physical Therapy Treatment Patient Details Name: Nicole [name] MRN: [id number] DOB: [birth year] Today's Date: 02/13/2018    History of Present Illness Nicole [name] is a 84 y.o. female with medical history significant of vascular dementia, diastolic Heart failure, A fib, CKD stage III, cr range 1.1--1.2, last admission [phone] for Diastolic heart failure exacerbation. She presents complaining of worsening SOB for last few days. She report SOB for last 3-4 months, worse lately. She is feeling weak and tired.    PT Comments    Patient with improved activity tolerance and balance noted this session. Ambulating 80 feet with walker and min guard assist. SpO2 97% on 2L O2 prior to mobility and 91% on 2L O2 following mobility. Patient husband voices desire to go home and states he is able to provide 24/7 assistance.     Follow Up Recommendations  Home health PT;Supervision/Assistance - 24 hour(if not consistent 24 hour assist, consider SNF)     Equipment Recommendations  None recommended by PT    Recommendations for Other Services       Precautions / Restrictions Precautions Precautions: Fall Precaution Comments: Telesitter in room Restrictions Weight Bearing Restrictions: No    Mobility  Bed Mobility Overal bed mobility: Needs Assistance Bed Mobility: Supine to Sit     Supine to sit: Min assist     General bed mobility comments: min assist with bed pad to bring hips to edge of bed  Transfers Overall transfer level: Needs assistance Equipment used: Rolling walker (2 wheeled) Transfers: Sit to/from Stand Sit to Stand: Min guard         General transfer comment: Cues for hand placement and safety. Tends to brace backs of LEs against bed for stability initially, indicative of incr fall risk  Ambulation/Gait Ambulation/Gait assistance: Min guard Gait Distance (Feet): 80 Feet Assistive device: Rolling walker (2 wheeled) Gait Pattern/deviations: Decreased stride  length;Step-through pattern Gait velocity: decreased   General Gait Details: Cues to self-monitor for activity tolerance. Walker height adjusted for comfort.  SpO2 96% on 2L O2 at rest with decrease to 91% SpO2 following mobility.   Stairs             Wheelchair Mobility    Modified Rankin (Stroke Patients Only)       Balance Overall balance assessment: Needs assistance Sitting-balance support: Feet supported;No upper extremity supported Sitting balance-Leahy Scale: Good     Standing balance support: Bilateral upper extremity supported Standing balance-Leahy Scale: Poor                              Cognition Arousal/Alertness: Awake/alert Behavior During Therapy: WFL for tasks assessed/performed Overall Cognitive Status: History of cognitive impairments - at baseline                                        Exercises      General Comments        Pertinent Vitals/Pain Pain Assessment: No/denies pain    Home Living                      Prior Function            PT Goals (current goals can now be found in the care plan section) Acute Rehab PT Goals Patient Stated Goal: did not state, but was agreeable to amb PT  Goal Formulation: Patient unable to participate in goal setting Time For Goal Achievement: 02/25/18 Potential to Achieve Goals: Good Progress towards PT goals: Progressing toward goals    Frequency    Min 3X/week      PT Plan Current plan remains appropriate    Co-evaluation              AM-PAC PT "6 Clicks" Daily Activity  Outcome Measure  Difficulty turning over in bed (including adjusting bedclothes, sheets and blankets)?: A Lot Difficulty moving from lying on back to sitting on the side of the bed? : Unable Difficulty sitting down on and standing up from a chair with arms (e.g., wheelchair, bedside commode, etc,.)?: Unable Help needed moving to and from a bed to chair (including a  wheelchair)?: A Little Help needed walking in hospital room?: A Little Help needed climbing 3-5 steps with a railing? : A Lot 6 Click Score: 12    End of Session Equipment Utilized During Treatment: Gait belt Activity Tolerance: Patient tolerated treatment well Patient left: in bed;with call bell/phone within reach;with bed alarm set;with family/visitor present Nurse Communication: Mobility status PT Visit Diagnosis: Unsteadiness on feet (R26.81);Other abnormalities of gait and mobility (R26.89);Other (comment)(decr functional capacity)     Time: 7471-8550 PT Time Calculation (min) (ACUTE ONLY): 26 min  Charges:  $Gait Training: 8-22 mins $Therapeutic Activity: 8-22 mins                     Ellamae Sia, PT, DPT Acute Rehabilitation Services  Pager: [place] 02/13/2018, 4:17 PM

## 2018-02-13 NOTE — Consult Note (Signed)
   [facility] Inpatient Consult   02/13/2018  Nicole [name] 1931-02-15 [id number]  Patient was assessed for [place] Management for community services and high risk for unplanned readmission score [29%] with [facility]. . Patient was previously attempted out reaches for [facility] nurse, social worker and pharmacy without success and no responses to letters sent note.   Met with patient, [name] [SO/friend] and [name] at the bedside regarding being [facility] services. Patient endorses Dr. Hollace Kinnier as her primary care provider.  Patient states she has transportation, sees her MD without any problems.  [name] states the patient also has a HCPOA [Goddaughter] [name] [phone] per Epic demographics, who is not currently available. A brochure and 24 hour nurse advise line with contact information was given and encouraged to share with HCPOA.  [name] states patient has had a home health agency in the past but unsure of the name.  Patient states, "I had finished up my time with them."  Chart review reveals that the patient has had outreaches from [place] noted.  Patient did not mention this nor did [name] or [name] when asked about any active agencies.  Of note, [facility] Care Management services does not replace or interfere with any services that are arranged by inpatient case management or social work. For additional questions or referrals please contact:  Natividad Brood, RN BSN [facility] Liaison  [phone] business mobile phone Toll free office [phone]

## 2018-02-14 DIAGNOSIS — J90 Pleural effusion, not elsewhere classified: Secondary | ICD-10-CM

## 2018-02-14 LAB — URINALYSIS, ROUTINE W REFLEX MICROSCOPIC
BILIRUBIN URINE: NEGATIVE
Glucose, UA: NEGATIVE mg/dL
HGB URINE DIPSTICK: NEGATIVE
Ketones, ur: NEGATIVE mg/dL
Leukocytes, UA: NEGATIVE
Nitrite: NEGATIVE
PROTEIN: NEGATIVE mg/dL
Specific Gravity, Urine: 1.01 (ref 1.005–1.030)
pH: 5 (ref 5.0–8.0)

## 2018-02-14 LAB — BASIC METABOLIC PANEL
Anion gap: 5 (ref 5–15)
BUN: 35 mg/dL — AB (ref 8–23)
CALCIUM: 8.7 mg/dL — AB (ref 8.9–10.3)
CO2: 35 mmol/L — ABNORMAL HIGH (ref 22–32)
CREATININE: 1.54 mg/dL — AB (ref 0.44–1.00)
Chloride: 102 mmol/L (ref 98–111)
GFR calc Af Amer: 35 mL/min — ABNORMAL LOW (ref >60)
GFR, EST NON AFRICAN AMERICAN: 30 mL/min — AB (ref >60)
Glucose, Bld: 116 mg/dL — ABNORMAL HIGH (ref 70–99)
Potassium: 4.4 mmol/L (ref 3.5–5.1)
SODIUM: 142 mmol/L (ref 135–145)

## 2018-02-14 LAB — GLUCOSE, CAPILLARY
GLUCOSE-CAPILLARY: 118 mg/dL — AB (ref 70–99)
GLUCOSE-CAPILLARY: 160 mg/dL — AB (ref 70–99)
GLUCOSE-CAPILLARY: 82 mg/dL (ref 70–99)
GLUCOSE-CAPILLARY: 89 mg/dL (ref 70–99)

## 2018-02-14 NOTE — Progress Notes (Addendum)
PROGRESS NOTE    Nicole [name]  FYB:[id number] DOB: 16-Sep-1930 DOA: 02/09/2018 PCP: [name], DO  Brief Narrative:84 y.o.femalewith medical history significant ofvascular dementia, diastolic Heart failure, A fib, CKD stage III, cr range 1.1--1.2, last admission [id number] for Diastolic heart failure exacerbation. She presents complaining of worsening SOBfor last few days. She is feeling weak and tired. She report dry cough, unable to bring phlegm. S/p thoracentesis-- 1L removed.    Assessment & Plan:   Active Problems:   History of colon cancer, stage III   Thoracic aortic aneurysm without rupture (HCC)   Atrial fibrillation, chronic (HCC)   Type 2 diabetes mellitus with peripheral neuropathy (HCC)   Vascular dementia   Acute on chronic systolic heart failure (HCC)   Hypothyroidism due to acquired atrophy of thyroid   DNR (do not resuscitate)   Hypoxemia   Acute on chronic respiratory failure with hypoxemia (HCC) Acute on chronic hypoxic respiratory failure Pleural effusion, right, acute on chronic diastolic CHF -Large pleural effusion, diastolic CHF exacerbation - status post thoracentesis on 7/22, 1 L removed, appears to be transudative -IV Lasix discontinued, unable to restart Lasix, creatinine trending up -O2 sats 100% on room air  Atrial fibrillation - Dr Eliseo Squires verified medications with patient's daughter, patient is on Toprol-XL but does not take Cardizem any longer -Continue beta-blocker -Not on anti-correlation due to bleeding risk  Acute on chronic diastolic CHF exacerbation - Presented with dyspnea, bilateral pleural effusion with elevated BNP -Patient was initially placed on IV Lasix, discontinued due to creatinine trending up  Acute kidney injury-no labs so far for today will order labs today and for tomorrow.  Avoid any nephrotoxic drugs. -Likely due to Lasix IV, currently held -Patient had an episode of low BP which could have worsened the renal  function.  Dementia, adult failure to thrive -PT evaluation recommended home health PT OT, 24-hour supervision if not available then skilled nursing facility -Outpatient follow-up with palliative care  Hypothyroidism -Continue Synthroid  Essential hypertension -Continue metoprolol, indoor -Lasix on hold, Cardizem discontinued    -Urinary retention in and out cath send a UA.    DVT prophylaxis: SCD Code Status: DNR Family Communication: No family available Disposition Plan: TBD  Consultants:  Interventional radiology  Procedures: Thoracentesis Antimicrobials None Subjective: Resting in bed in no acute distress denies any specific complaints no nausea vomiting fever cough.  Objective: Vitals:   02/13/18 1734 02/13/18 2135 02/14/18 0619 02/14/18 0918  BP: (!) 152/80 126/80 (!) 143/94 (!) 146/88  Pulse: 74 92 (!) 116 81  Resp: 18 16 18 16   Temp: 98.4 F (36.9 C) 98.3 F (36.8 C) 98.6 F (37 C) (!) 97.5 F (36.4 C)  TempSrc: Oral Oral Oral Oral  SpO2: 100% 100% 100% 100%  Weight:  50.5 kg (111 lb 5.3 oz)    Height:        Intake/Output Summary (Last 24 hours) at 02/14/2018 1519 Last data filed at 02/14/2018 1432 Gross per 24 hour  Intake 900 ml  Output 0 ml  Net 900 ml   Filed Weights   02/10/18 2023 02/12/18 2049 02/13/18 2135  Weight: 50.5 kg (111 lb 5.3 oz) 50.5 kg (111 lb 5.3 oz) 50.5 kg (111 lb 5.3 oz)    Examination:  General exam: Appears calm and comfortable  Respiratory system: Clear to auscultation. Respiratory effort normal. Cardiovascular system: S1 & S2 heard, RRR. No JVD, murmurs, rubs, gallops or clicks. No pedal edema. Gastrointestinal system: Abdomen is nondistended, soft and  nontender. No organomegaly or masses felt. Normal bowel sounds heard. Central nervous system: Alert and oriented. No focal neurological deficits. Extremities: Symmetric 5 x 5 power. Skin: No rashes, lesions or ulcers Psychiatry: Judgement and insight appear  normal. Mood & affect appropriate.     Data Reviewed: I have personally reviewed following labs and imaging studies  CBC: Recent Labs  Lab 02/09/18 1239 02/10/18 0824 02/11/18 0539 02/12/18 0447 02/13/18 0424  WBC 5.0 5.6 7.5 8.3 7.5  HGB 11.2* 10.3* 11.7* 10.6* 9.6*  HCT 33.6* 30.7* 35.1* 31.2* 28.2*  MCV 81.4 82.1 82.0 80.6 80.3  PLT 200 211 183 208 381   Basic Metabolic Panel: Recent Labs  Lab 02/09/18 1239 02/10/18 0824 02/11/18 0539 02/12/18 0447 02/13/18 0424  NA 143 145 143 143 141  K 3.7 3.2* 3.6 4.1 4.7  CL 102 102 101 105 102  CO2 30 34* 31 32 32  GLUCOSE 101* 98 61* 111* 109*  BUN 10 14 21  27* 36*  CREATININE 1.10* 1.51* 2.23* 2.07* 2.26*  CALCIUM 8.7* 8.7* 8.5* 8.6* 8.4*   GFR: Estimated Creatinine Clearance: 14.8 mL/min (A) (by C-G formula based on SCr of 2.26 mg/dL (H)). Liver Function Tests: Recent Labs  Lab 02/10/18 0824  AST 16  ALT 7  ALKPHOS 65  BILITOT 0.7  PROT 6.6  ALBUMIN 2.7*   No results for input(s): LIPASE, AMYLASE in the last 168 hours. No results for input(s): AMMONIA in the last 168 hours. Coagulation Profile: Recent Labs  Lab 02/09/18 1239 02/10/18 0824  INR 1.13 1.17   Cardiac Enzymes: No results for input(s): CKTOTAL, CKMB, CKMBINDEX, TROPONINI in the last 168 hours. BNP (last 3 results) No results for input(s): PROBNP in the last 8760 hours. HbA1C: No results for input(s): HGBA1C in the last 72 hours. CBG: Recent Labs  Lab 02/13/18 1140 02/13/18 1653 02/14/18 0005 02/14/18 0726 02/14/18 1317  GLUCAP 191* 139* 118* 82 89   Lipid Profile: No results for input(s): CHOL, HDL, LDLCALC, TRIG, CHOLHDL, LDLDIRECT in the last 72 hours. Thyroid Function Tests: No results for input(s): TSH, T4TOTAL, FREET4, T3FREE, THYROIDAB in the last 72 hours. Anemia Panel: No results for input(s): VITAMINB12, FOLATE, FERRITIN, TIBC, IRON, RETICCTPCT in the last 72 hours. Sepsis Labs: No results for input(s): PROCALCITON,  LATICACIDVEN in the last 168 hours.  Recent Results (from the past 240 hour(s))  Gram stain     Status: None   Collection Time: 02/10/18  4:00 PM  Result Value Ref Range Status   Specimen Description PLEURAL RIGHT  Final   Special Requests NONE  Final   Gram Stain   Final    ABUNDANT WBC PRESENT, PREDOMINANTLY MONONUCLEAR NO ORGANISMS SEEN Performed at [facility], [address] [place], [place] [address]    Report Status 02/10/2018 FINAL  Final  Culture, body fluid-bottle     Status: None (Preliminary result)   Collection Time: 02/10/18  4:00 PM  Result Value Ref Range Status   Specimen Description PLEURAL RIGHT  Final   Special Requests NONE  Final   Culture   Final    NO GROWTH 4 DAYS Performed at [facility] [address], [place], [place] [address]    Report Status PENDING  Incomplete         Radiology Studies: Dg Chest Port 1 View  Result Date: 02/12/2018 CLINICAL DATA:  Follow-up right pleural effusion. The patient underwent thoracentesis yesterday. EXAM: PORTABLE CHEST 1 VIEW COMPARISON:  Post thoracentesis radiograph  of February 10, 2018 FINDINGS: There has been interval reaccumulation of a moderate amount of pleural fluid on the right. A trace of pleural fluid on the left is present and stable. There is no pneumothorax. The cardiac silhouette is enlarged. The central pulmonary vascularity is mildly prominent but stable. There is calcification in the wall of the aortic arch. The bony thorax exhibits no acute abnormality. IMPRESSION: Interval re-accumulation of a moderate amount of pleural fluid on the right. Electronically Signed   By: David  Martinique M.D.   On: 02/12/2018 15:41        Scheduled Meds: . allopurinol  100 mg Oral Daily  . aspirin EC  81 mg Oral Daily  . donepezil  10 mg Oral QHS  . feeding supplement (ENSURE ENLIVE)  237 mL Oral BID BM  . ferrous sulfate  325 mg Oral BID WC  . furosemide  40 mg Oral Daily  . guaiFENesin  600 mg Oral  BID  . isosorbide mononitrate  30 mg Oral BID  . levothyroxine  25 mcg Oral QAC breakfast  . metoprolol succinate  25 mg Oral Daily  . potassium chloride  20 mEq Oral Daily  . sertraline  50 mg Oral Daily  . sodium chloride flush  3 mL Intravenous Q12H   Continuous Infusions: . sodium chloride       LOS: 3 days     Georgette Shell, MD [facility]  If 7PM-7AM, please contact night-coverage [email or web address] Password [facility] 02/14/2018, 3:19 PM

## 2018-02-14 NOTE — Care Management Important Message (Signed)
Important Message  Patient Details  Name: Nicole [name] MRN: [id number] Date of Birth: [birth year]   Medicare Important Message Given:  Yes    Orbie Pyo 02/14/2018, 3:04 PM

## 2018-02-15 DIAGNOSIS — E1142 Type 2 diabetes mellitus with diabetic polyneuropathy: Secondary | ICD-10-CM

## 2018-02-15 DIAGNOSIS — L89153 Pressure ulcer of sacral region, stage 3: Secondary | ICD-10-CM

## 2018-02-15 LAB — BASIC METABOLIC PANEL
Anion gap: 8 (ref 5–15)
BUN: 37 mg/dL — ABNORMAL HIGH (ref 8–23)
CO2: 32 mmol/L (ref 22–32)
Calcium: 8.8 mg/dL — ABNORMAL LOW (ref 8.9–10.3)
Chloride: 101 mmol/L (ref 98–111)
Creatinine, Ser: 1.36 mg/dL — ABNORMAL HIGH (ref 0.44–1.00)
GFR calc Af Amer: 40 mL/min — ABNORMAL LOW (ref >60)
GFR, EST NON AFRICAN AMERICAN: 35 mL/min — AB (ref >60)
GLUCOSE: 89 mg/dL (ref 70–99)
POTASSIUM: 4.1 mmol/L (ref 3.5–5.1)
Sodium: 141 mmol/L (ref 135–145)

## 2018-02-15 LAB — CULTURE, BODY FLUID W GRAM STAIN -BOTTLE: Culture: NO GROWTH

## 2018-02-15 LAB — GLUCOSE, CAPILLARY
GLUCOSE-CAPILLARY: 81 mg/dL (ref 70–99)
GLUCOSE-CAPILLARY: 92 mg/dL (ref 70–99)
Glucose-Capillary: 89 mg/dL (ref 70–99)

## 2018-02-15 MED ORDER — FUROSEMIDE 20 MG PO TABS: 20.0000 mg | ORAL_TABLET | Freq: Every day | ORAL | 11 refills | Status: DC

## 2018-02-15 MED ORDER — POTASSIUM CHLORIDE ER 10 MEQ PO TBCR: 10.0000 meq | EXTENDED_RELEASE_TABLET | Freq: Every day | ORAL | 0 refills | Status: AC

## 2018-02-15 MED ORDER — DILTIAZEM HCL 30 MG PO TABS: 30.0000 mg | ORAL_TABLET | Freq: Two times a day (BID) | ORAL | 0 refills | Status: DC

## 2018-02-15 NOTE — Care Management Note (Signed)
Case Management Note  Patient Details  Name: Nicole [name] MRN: [id number] Date of Birth: 09-21-1930  Subjective/Objective:   Pt from home with aide and friend to stay with her to assist 24/7.  Pt has used HH in the past but unsure of last agency.  Daughter is familiar with [facility] and willing to use again.                Daughter states patient can be transported in the car and does not need ambulance.   Action/Plan: Referral to [facility] for [facility] needs.    Expected Discharge Date:  02/15/18               Expected Discharge Plan:  [place]  In-House Referral:  NA  Discharge planning Services  CM Consult  Post Acute Care Choice:  Home Health Choice offered to:  Patient  DME Arranged:  N/A DME Agency:  NA  HH Arranged:  PT, OT HH Agency:  [place]  Status of Service:  Completed, signed off  If discussed at [place] of Stay Meetings, dates discussed:    Additional Comments:  Claudie Leach, RN 02/15/2018, 1:54 PM

## 2018-02-15 NOTE — Progress Notes (Addendum)
Bladder scan done-201 ml of residual urine detected.M.D. Made aware.No order received.

## 2018-02-15 NOTE — Discharge Summary (Signed)
Physician Discharge Summary  DAVINE SWENEY SAY:[id number] DOB: Dec 07, 1930 DOA: 02/09/2018  PCP: [name], DO  Admit date: 02/09/2018 Discharge date: 02/15/2018  Admitted From: Home Disposition: Home Recommendations for Outpatient Follow-up:  1. Follow up with PCP in 1-2 weeks 2. Please obtain BMP/CBC in one week 3. Follow-up with Dr. Debara Pickett  Home Health home health PT Equipment/Devices: None Discharge Condition: Stable CODE STATUS DO NOT RESUSCITATE Diet recommendation: Cardiac Brief/Interim Summary:84 y.o.femalewith medical history significant ofvascular dementia, diastolic Heart failure, A fib, CKD stage III, cr range 1.1--1.2, last admission [id number] for Diastolic heart failure exacerbation. She presents complaining of worsening SOBfor last few days. She is feeling weak and tired. She report dry cough, unable to bring phlegm. S/p thoracentesis-- 1L removed.      Discharge Diagnoses:  Active Problems:   History of colon cancer, stage III   Thoracic aortic aneurysm without rupture (HCC)   Atrial fibrillation, chronic (HCC)   Type 2 diabetes mellitus with peripheral neuropathy (HCC)   Vascular dementia   Acute on chronic systolic heart failure (HCC)   Hypothyroidism due to acquired atrophy of thyroid   DNR (do not resuscitate)   Hypoxemia   Acute on chronic respiratory failure with hypoxemia (HCC)   Pleural effusion   Stage III pressure ulcer of sacral region ([place])  Acute on chronic hypoxic respiratory failure Pleural effusion,right, acute on chronic diastolic CHF -Large pleural effusion, diastolic CHF exacerbation -status post thoracentesis on 7/22, 1 L removed, appears to be transudative Lasix will be restarted at a smaller dose of 20 mg daily.  Atrial fibrillation - Dr Alysia Penna medications with patient's daughter, patient is on Toprol-XL but does not take Cardizem any longer -Continue beta-blocker -Not on anti-correlation due to bleeding  risk -Will have to restart Cardizem at a smaller dose due to heart rate climbing up slowly.  I will DC her on Cardizem 30 mg twice a day.  I have discussed this with the daughter.  I have told her to follow-up with Dr. Debara Pickett as an outpatient as soon as possible.  Acute on chronic diastolic CHF exacerbation -Presented with dyspnea, bilateral pleural effusion with elevated BNP -Patient was initially placed on IV Lasix, discontinued due to creatinine trending up  Acute kidney injury-creatinine improved.  Restart Lasix slowly at a smaller dose.  The abdomen on the day of discharge is 1.36, creatinine of the day of admission was 2.23.  Dementia, adult failure to thrive -PT evaluation recommended home health PT OT, 24-hour supervision if not available then skilled nursing facility -Outpatient follow-up with palliative care  Hypothyroidism -Continue Synthroid  Essential hypertension -Continue metoprolol, indoor Restart Lasix at 20 mg daily.  She was taking 40 mg at home.  Restart Cardizem at 30 mg twice a day.   -Urinary retention in and out cath ua clear     Discharge Instructions  Discharge Instructions    Call MD for:  difficulty breathing, headache or visual disturbances   Complete by:  As directed    Call MD for:  persistant dizziness or light-headedness   Complete by:  As directed    Call MD for:  persistant nausea and vomiting   Complete by:  As directed    Call MD for:  severe uncontrolled pain   Complete by:  As directed    Call MD for:  temperature >100.4   Complete by:  As directed    Diet - low sodium heart healthy   Complete by:  As directed  Face-to-face encounter (required for Medicare/Medicaid patients)   Complete by:  As directed    [place] certify that this patient is under my care and that I, or a nurse practitioner or physician's assistant working with me, had a face-to-face encounter that meets the physician face-to-face encounter  requirements with this patient on 02/15/2018. The encounter with the patient was in whole, or in part for the following medical condition(s) which is the primary reason for home health care (List medical condition): aki htn unsteady gait   The encounter with the patient was in whole, or in part, for the following medical condition, which is the primary reason for home health care:  aki afib htn   I certify that, based on my findings, the following services are medically necessary home health services:  Physical therapy   Reason for Medically Necessary Home Health Services:  Therapy- Personnel officer, Public librarian   My clinical findings support the need for the above services:  Unable to leave home safely without assistance and/or assistive device   Further, I certify that my clinical findings support that this patient is homebound due to:  Unable to leave home safely without assistance   Home Health   Complete by:  As directed    To provide the following care/treatments:   PT OT     Increase activity slowly   Complete by:  As directed      Allergies as of 02/15/2018      Reactions   Iohexol Nausea And Vomiting   Iodinated Diagnostic Agents Nausea And Vomiting   Z-pak [azithromycin] Other (See Comments)   Reaction unknown by family      Medication List    STOP taking these medications   diltiazem 360 MG 24 hr capsule Commonly known as:  CARDIZEM CD   hydrALAZINE 25 MG tablet Commonly known as:  APRESOLINE   Ibuprofen 200 MG Caps   OVER THE COUNTER MEDICATION     TAKE these medications   acetaminophen 650 MG CR tablet Commonly known as:  TYLENOL Take 650 mg by mouth every 8 (eight) hours as needed for pain.   allopurinol 100 MG tablet Commonly known as:  ZYLOPRIM Take 1 tablet (100 mg total) by mouth daily.   aspirin 81 MG EC tablet Take 1 tablet (81 mg total) by mouth daily.   diltiazem 30 MG tablet Commonly known as:  CARDIZEM Take 1 tablet (30 mg  total) by mouth 2 (two) times daily.   donepezil 10 MG tablet Commonly known as:  ARICEPT Take 1 tablet (10 mg total) by mouth at bedtime.   ferrous sulfate 325 (65 FE) MG EC tablet TAKE 1 TABLET (325 MG TOTAL) BY MOUTH 2 (TWO) TIMES DAILY WITH A MEAL.   furosemide 20 MG tablet Commonly known as:  LASIX Take 1 tablet (20 mg total) by mouth daily. What changed:    medication strength  how much to take   ipratropium 0.02 % nebulizer solution Commonly known as:  ATROVENT Take 2.5 mLs (0.5 mg total) by nebulization every 6 (six) hours as needed for wheezing or shortness of breath.   isosorbide mononitrate 30 MG 24 hr tablet Commonly known as:  IMDUR Take 1 tablet (30 mg total) by mouth 2 (two) times daily.   levothyroxine 25 MCG tablet Commonly known as:  SYNTHROID, LEVOTHROID Take 1 tablet (25 mcg total) by mouth daily before breakfast.   metoprolol succinate 25 MG 24 hr tablet Commonly known  as:  TOPROL-XL Take 1 tablet (25 mg total) by mouth daily.   OXYGEN Inhale 2 L into the lungs as needed.   potassium chloride 10 MEQ tablet Commonly known as:  K-DUR Take 1 tablet (10 mEq total) by mouth daily. What changed:    how much to take  how to take this  when to take this  additional instructions   sertraline 50 MG tablet Commonly known as:  ZOLOFT TAKE 1 TABLET BY MOUTH EVERY DAY What changed:    how much to take  how to take this  when to take this      Follow-up Information    [name], [name], DO Follow up.   Specialty:  Geriatric Medicine Contact information: [place]. [place] [address] [phone]        Pixie Casino, MD .   Specialty:  Cardiology Contact information: [address] [place] [address] [phone]          Allergies  Allergen Reactions  . Iohexol Nausea And Vomiting  . Iodinated Diagnostic Agents Nausea And Vomiting  . Z-Pak [Azithromycin] Other (See Comments)    Reaction unknown by  family    Consultations: None  Procedures/Studies: Dg Chest 1 View  Result Date: 02/10/2018 CLINICAL DATA:  Status post thoracentesis. EXAM: CHEST  1 VIEW COMPARISON:  Chest x-ray dated 02/09/2018. FINDINGS: Improved aeration at the RIGHT lung base status post thoracentesis. No pneumothorax seen. Stable small LEFT pleural effusion and/or atelectasis. Stable cardiomegaly. IMPRESSION: Decreased RIGHT pleural effusion status post thoracentesis. No pneumothorax seen. Electronically Signed   By: Franki Cabot M.D.   On: 02/10/2018 16:00   Dg Chest 2 View  Result Date: 02/09/2018 CLINICAL DATA:  Shortness of breath EXAM: CHEST - 2 VIEW COMPARISON:  09/11/2017 FINDINGS: Stable cardiomegaly with vascular congestion and mild to moderate pleural effusions. Associated bibasilar atelectasis/partial consolidation. Upper lungs remain clear. No pneumothorax. Trachea is midline. Aorta is atherosclerotic. Degenerative changes of the spine with diffuse osteopenia. IMPRESSION: Cardiomegaly with vascular congestion and pleural effusions. Bibasilar compressive atelectasis/partial consolidation. Electronically Signed   By: Jerilynn Mages.  Shick M.D.   On: 02/09/2018 13:13   Ct Chest Wo Contrast  Result Date: 02/09/2018 CLINICAL DATA:  Shortness of breath EXAM: CT CHEST WITHOUT CONTRAST TECHNIQUE: Multidetector CT imaging of the chest was performed following the standard protocol without IV contrast. COMPARISON:  CTA chest 01/11/2017 FINDINGS: Cardiovascular: Severe cardiomegaly. There are coronary artery calcifications and moderate aortic atherosclerosis. The main pulmonary artery is enlarged, measuring 4.5 cm at the bifurcation. Unchanged 4.8 cm ascending thoracic aortic aneurysm. Mediastinum/Nodes: Visualized thyroid is normal. There is no mediastinal or axillary lymphadenopathy. Lungs/Pleura: Large right and small left pleural effusions with associated atelectasis. There is at least 50% collapse of the right lower lobe. Pleural  effusions are larger than on the prior CT of 01/11/2017. There is otherwise no consolidation. No pulmonary edema. Upper Abdomen: The unenhanced appearance of the visible portions of the upper abdominal organs is normal. Musculoskeletal: No acute abnormality or bony spinal canal stenosis. IMPRESSION: 1. Large right and small left pleural effusions with associated atelectasis. No other airspace disease. 2. Enlarged main pulmonary artery, consistent with pulmonary hypertension. 3. Unchanged 4.8 cm ascending thoracic aortic aneurysm. Recommend semi-annual imaging followup by CTA or MRA and referral to cardiothoracic surgery if not already obtained. 4. Cardiomegaly with coronary artery and aortic atherosclerosis (ICD10-I70.0). The above recommendation follows 2010 ACCF/AHA/AATS/ACR/ASA/SCA/SCAI/SIR/STS/SVM Guidelines for the Diagnosis and Management of Patients With Thoracic Aortic  Disease. Circulation. 2010; 121: F643-P295 Electronically Signed   By: Ulyses Jarred M.D.   On: 02/09/2018 16:40   Dg Chest Port 1 View  Result Date: 02/12/2018 CLINICAL DATA:  Follow-up right pleural effusion. The patient underwent thoracentesis yesterday. EXAM: PORTABLE CHEST 1 VIEW COMPARISON:  Post thoracentesis radiograph of February 10, 2018 FINDINGS: There has been interval reaccumulation of a moderate amount of pleural fluid on the right. A trace of pleural fluid on the left is present and stable. There is no pneumothorax. The cardiac silhouette is enlarged. The central pulmonary vascularity is mildly prominent but stable. There is calcification in the wall of the aortic arch. The bony thorax exhibits no acute abnormality. IMPRESSION: Interval re-accumulation of a moderate amount of pleural fluid on the right. Electronically Signed   By: David  Martinique M.D.   On: 02/12/2018 15:41   Ir Thoracentesis Asp Pleural Space W/img Guide  Result Date: 02/10/2018 INDICATION: Worsening SHOB x 3-4 months; hx diastolic CHF with last exacerbation  10/16/17. Small left pleural effusion and large right pleural effusion on imaging 02/09/18. Request for diagnostic and therapeutic thoracentesis. EXAM: ULTRASOUND GUIDED RIGHT THORACENTESIS MEDICATIONS: 10 mL 2% lidocaine. COMPLICATIONS: None immediate. PROCEDURE: An ultrasound guided thoracentesis was thoroughly discussed with the patient and questions answered. The benefits, risks, alternatives and complications were also discussed. The patient understands and wishes to proceed with the procedure. Written consent was obtained. Ultrasound was performed to localize and mark an adequate pocket of fluid in the right chest. The area was then prepped and draped in the normal sterile fashion. 2% Lidocaine was used for local anesthesia. Under ultrasound guidance a 6 Fr Safe-T-Centesis catheter was introduced. Thoracentesis was performed. The catheter was removed and a dressing applied. FINDINGS: A total of approximately 1.0L of serosanguinous fluid was removed. Samples were sent to the laboratory as requested by the clinical team. IMPRESSION: Successful ultrasound guided right thoracentesis yielding 1.0L of pleural fluid. Read by Candiss Norse, PA-C Electronically Signed   By: Markus Daft M.D.   On: 02/10/2018 16:05    (Echo, Carotid, EGD, Colonoscopy, ERCP)    Subjective:   Discharge Exam: Vitals:   02/14/18 2044 02/15/18 0506  BP: 134/88 134/81  Pulse: (!) 114 (!) 108  Resp: 20 20  Temp: 98.2 F (36.8 C) 98.1 F (36.7 C)  SpO2: 100% 100%   Vitals:   02/14/18 0918 02/14/18 1617 02/14/18 2044 02/15/18 0506  BP: (!) 146/88 136/87 134/88 134/81  Pulse: 81 97 (!) 114 (!) 108  Resp: 16 17 20 20   Temp: (!) 97.5 F (36.4 C) (!) 97.5 F (36.4 C) 98.2 F (36.8 C) 98.1 F (36.7 C)  TempSrc: Oral Oral Oral Oral  SpO2: 100% 100% 100% 100%  Weight:      Height:        General: Pt is alert, awake, not in acute distress Cardiovascular: RRR, S1/S2 +, no rubs, no gallops Respiratory: CTA  bilaterally, no wheezing, no rhonchi Abdominal: Soft, NT, ND, bowel sounds + Extremities: no edema, no cyanosis    The results of significant diagnostics from this hospitalization (including imaging, microbiology, ancillary and laboratory) are listed below for reference.     Microbiology: Recent Results (from the past 240 hour(s))  Gram stain     Status: None   Collection Time: 02/10/18  4:00 PM  Result Value Ref Range Status   Specimen Description PLEURAL RIGHT  Final   Special Requests NONE  Final   Gram Stain   Final  ABUNDANT WBC PRESENT, PREDOMINANTLY MONONUCLEAR NO ORGANISMS SEEN Performed at [facility] [address], [place], [place] [address]    Report Status 02/10/2018 FINAL  Final  Culture, body fluid-bottle     Status: None (Preliminary result)   Collection Time: 02/10/18  4:00 PM  Result Value Ref Range Status   Specimen Description PLEURAL RIGHT  Final   Special Requests NONE  Final   Culture   Final    NO GROWTH 4 DAYS Performed at [facility] [address] [place], [place] [address]    Report Status PENDING  Incomplete     Labs: BNP (last 3 results) Recent Labs    07/11/17 0851 10/09/17 2020 02/09/18 1239  BNP 554* 892.5* 284.1*   Basic Metabolic Panel: Recent Labs  Lab 02/11/18 0539 02/12/18 0447 02/13/18 0424 02/14/18 1527 02/15/18 0605  NA 143 143 141 142 141  K 3.6 4.1 4.7 4.4 4.1  CL 101 105 102 102 101  CO2 31 32 32 35* 32  GLUCOSE 61* 111* 109* 116* 89  BUN 21 27* 36* 35* 37*  CREATININE 2.23* 2.07* 2.26* 1.54* 1.36*  CALCIUM 8.5* 8.6* 8.4* 8.7* 8.8*   Liver Function Tests: Recent Labs  Lab 02/10/18 0824  AST 16  ALT 7  ALKPHOS 65  BILITOT 0.7  PROT 6.6  ALBUMIN 2.7*   No results for input(s): LIPASE, AMYLASE in the last 168 hours. No results for input(s): AMMONIA in the last 168 hours. CBC: Recent Labs  Lab 02/09/18 1239 02/10/18 0824 02/11/18 0539 02/12/18 0447 02/13/18 0424  WBC 5.0 5.6 7.5  8.3 7.5  HGB 11.2* 10.3* 11.7* 10.6* 9.6*  HCT 33.6* 30.7* 35.1* 31.2* 28.2*  MCV 81.4 82.1 82.0 80.6 80.3  PLT 200 211 183 208 192   Cardiac Enzymes: No results for input(s): CKTOTAL, CKMB, CKMBINDEX, TROPONINI in the last 168 hours. BNP: Invalid input(s): POCBNP CBG: Recent Labs  Lab 02/14/18 0726 02/14/18 1317 02/14/18 1835 02/15/18 0017 02/15/18 0604  GLUCAP 82 89 160* 81 92   D-Dimer No results for input(s): DDIMER in the last 72 hours. Hgb A1c No results for input(s): HGBA1C in the last 72 hours. Lipid Profile No results for input(s): CHOL, HDL, LDLCALC, TRIG, CHOLHDL, LDLDIRECT in the last 72 hours. Thyroid function studies No results for input(s): TSH, T4TOTAL, T3FREE, THYROIDAB in the last 72 hours.  Invalid input(s): FREET3 Anemia work up No results for input(s): VITAMINB12, FOLATE, FERRITIN, TIBC, IRON, RETICCTPCT in the last 72 hours. Urinalysis    Component Value Date/Time   COLORURINE YELLOW 02/14/2018 1635   APPEARANCEUR CLEAR 02/14/2018 1635   LABSPEC 1.010 02/14/2018 1635   PHURINE 5.0 02/14/2018 1635   GLUCOSEU NEGATIVE 02/14/2018 1635   HGBUR NEGATIVE 02/14/2018 1635   BILIRUBINUR NEGATIVE 02/14/2018 1635   KETONESUR NEGATIVE 02/14/2018 1635   PROTEINUR NEGATIVE 02/14/2018 1635   UROBILINOGEN 2.0 (H) 04/13/2015 1844   NITRITE NEGATIVE 02/14/2018 1635   LEUKOCYTESUR NEGATIVE 02/14/2018 1635   Sepsis Labs Invalid input(s): PROCALCITONIN,  WBC,  LACTICIDVEN Microbiology Recent Results (from the past 240 hour(s))  Gram stain     Status: None   Collection Time: 02/10/18  4:00 PM  Result Value Ref Range Status   Specimen Description PLEURAL RIGHT  Final   Special Requests NONE  Final   Gram Stain   Final    ABUNDANT WBC PRESENT, PREDOMINANTLY MONONUCLEAR NO ORGANISMS SEEN Performed at [facility], [place] [address] [place], [place] [address]  Report Status 02/10/2018 FINAL  Final  Culture, body fluid-bottle     Status: None  (Preliminary result)   Collection Time: 02/10/18  4:00 PM  Result Value Ref Range Status   Specimen Description PLEURAL RIGHT  Final   Special Requests NONE  Final   Culture   Final    NO GROWTH 4 DAYS Performed at [facility], [address], [place], [place] [address]    Report Status PENDING  Incomplete     Time coordinating discharge: 35  minutes  SIGNED:   Georgette Shell, MD  [facility] 02/15/2018, 9:56 AM Pager   If 7PM-7AM, please contact night-coverage [email or web address] Password [email or web address]

## 2018-02-17 ENCOUNTER — Telehealth: Payer: Self-pay

## 2018-02-17 NOTE — Telephone Encounter (Signed)
Transition Care Management Follow-up Telephone Call  Date of discharge and from where: [facility] on 02/15/2018  How have you been since you were released from the hospital? Per patient's daughter she has been doing well with no complaints  Any questions or concerns? Yes . Feels like her medications keep being changed by different doctors and would like some clarification  Items Reviewed:  Did the pt receive and understand the discharge instructions provided? Yes   Medications obtained and verified? Yes   Any new allergies since your discharge? No   Dietary orders reviewed? Yes  Do you have support at home? Yes   Other (ie: DME, Home Health, etc) Home health and caregiver. Daughter doesn't know company  Functional Questionnaire: (I = Independent and D = Dependent) ADL's: I w/ assist  Bathing/Dressing- I w/ assist   Meal Prep- D  Eating- I  Maintaining continence- Urgent urine  Transferring/Ambulation- walker or cane  Managing Meds- D, goddaughter helps   Follow up appointments reviewed:    PCP Hospital f/u appt confirmed? Yes  Scheduled to see Dr. Mariea Clonts on 02/26/2018 @ 3:30pm.  [facility] f/u appt confirmed? Yes  Will call Dr. Debara Pickett to make f/u appt  Are transportation arrangements needed? No   If their condition worsens, is the pt aware to call  their PCP or go to the ED? Yes  Was the patient provided with contact information for the PCP's office or ED? Yes  Was the pt encouraged to call back with questions or concerns? Yes

## 2018-02-19 DIAGNOSIS — N183 Chronic kidney disease, stage 3 (moderate): Secondary | ICD-10-CM | POA: Diagnosis not present

## 2018-02-19 DIAGNOSIS — N179 Acute kidney failure, unspecified: Secondary | ICD-10-CM | POA: Diagnosis not present

## 2018-02-19 DIAGNOSIS — J9621 Acute and chronic respiratory failure with hypoxia: Secondary | ICD-10-CM | POA: Diagnosis not present

## 2018-02-19 DIAGNOSIS — I2510 Atherosclerotic heart disease of native coronary artery without angina pectoris: Secondary | ICD-10-CM | POA: Diagnosis not present

## 2018-02-19 DIAGNOSIS — I5043 Acute on chronic combined systolic (congestive) and diastolic (congestive) heart failure: Secondary | ICD-10-CM | POA: Diagnosis not present

## 2018-02-19 DIAGNOSIS — I130 Hypertensive heart and chronic kidney disease with heart failure and stage 1 through stage 4 chronic kidney disease, or unspecified chronic kidney disease: Secondary | ICD-10-CM | POA: Diagnosis not present

## 2018-02-19 DIAGNOSIS — I272 Pulmonary hypertension, unspecified: Secondary | ICD-10-CM | POA: Diagnosis not present

## 2018-02-19 DIAGNOSIS — D631 Anemia in chronic kidney disease: Secondary | ICD-10-CM | POA: Diagnosis not present

## 2018-02-19 DIAGNOSIS — E1122 Type 2 diabetes mellitus with diabetic chronic kidney disease: Secondary | ICD-10-CM | POA: Diagnosis not present

## 2018-02-19 DIAGNOSIS — I700 Atherosclerosis of aorta: Secondary | ICD-10-CM | POA: Diagnosis not present

## 2018-02-21 ENCOUNTER — Telehealth: Payer: Self-pay

## 2018-02-21 NOTE — Telephone Encounter (Addendum)
Rip Harbour with [facility] called requesting verbal orders for PT evaluation 1 week 1  Per [facility] standing order, verbal order given. Message will be sent to patient's provider as a FYI.

## 2018-02-25 DIAGNOSIS — M109 Gout, unspecified: Secondary | ICD-10-CM | POA: Diagnosis not present

## 2018-02-25 DIAGNOSIS — I5032 Chronic diastolic (congestive) heart failure: Secondary | ICD-10-CM | POA: Diagnosis not present

## 2018-02-25 DIAGNOSIS — R0602 Shortness of breath: Secondary | ICD-10-CM | POA: Diagnosis not present

## 2018-02-25 DIAGNOSIS — M6281 Muscle weakness (generalized): Secondary | ICD-10-CM | POA: Diagnosis not present

## 2018-02-25 DIAGNOSIS — R69 Illness, unspecified: Secondary | ICD-10-CM | POA: Diagnosis not present

## 2018-02-25 DIAGNOSIS — J189 Pneumonia, unspecified organism: Secondary | ICD-10-CM | POA: Diagnosis not present

## 2018-02-25 DIAGNOSIS — I129 Hypertensive chronic kidney disease with stage 1 through stage 4 chronic kidney disease, or unspecified chronic kidney disease: Secondary | ICD-10-CM | POA: Diagnosis not present

## 2018-02-26 ENCOUNTER — Inpatient Hospital Stay (HOSPITAL_COMMUNITY)
Admission: EM | Admit: 2018-02-26 | Discharge: 2018-03-01 | DRG: 291 | Disposition: A | Discharge status: Home / Self Care | Payer: Medicare HMO | Attending: Family Medicine | Admitting: Family Medicine

## 2018-02-26 ENCOUNTER — Inpatient Hospital Stay (HOSPITAL_COMMUNITY): Payer: Medicare HMO

## 2018-02-26 ENCOUNTER — Encounter: Payer: Self-pay | Admitting: Internal Medicine

## 2018-02-26 ENCOUNTER — Other Ambulatory Visit: Payer: Self-pay

## 2018-02-26 ENCOUNTER — Emergency Department (HOSPITAL_COMMUNITY): Payer: Medicare HMO

## 2018-02-26 ENCOUNTER — Encounter (HOSPITAL_COMMUNITY): Payer: Self-pay | Admitting: Emergency Medicine

## 2018-02-26 DIAGNOSIS — T501X5A Adverse effect of loop [high-ceiling] diuretics, initial encounter: Secondary | ICD-10-CM | POA: Diagnosis present

## 2018-02-26 DIAGNOSIS — N189 Chronic kidney disease, unspecified: Secondary | ICD-10-CM | POA: Diagnosis present

## 2018-02-26 DIAGNOSIS — F419 Anxiety disorder, unspecified: Secondary | ICD-10-CM | POA: Diagnosis present

## 2018-02-26 DIAGNOSIS — Z8673 Personal history of transient ischemic attack (TIA), and cerebral infarction without residual deficits: Secondary | ICD-10-CM

## 2018-02-26 DIAGNOSIS — M1990 Unspecified osteoarthritis, unspecified site: Secondary | ICD-10-CM | POA: Diagnosis present

## 2018-02-26 DIAGNOSIS — I5033 Acute on chronic diastolic (congestive) heart failure: Secondary | ICD-10-CM | POA: Diagnosis present

## 2018-02-26 DIAGNOSIS — J9621 Acute and chronic respiratory failure with hypoxia: Secondary | ICD-10-CM | POA: Diagnosis not present

## 2018-02-26 DIAGNOSIS — I071 Rheumatic tricuspid insufficiency: Secondary | ICD-10-CM | POA: Diagnosis present

## 2018-02-26 DIAGNOSIS — R0902 Hypoxemia: Secondary | ICD-10-CM | POA: Diagnosis not present

## 2018-02-26 DIAGNOSIS — J90 Pleural effusion, not elsewhere classified: Secondary | ICD-10-CM | POA: Diagnosis not present

## 2018-02-26 DIAGNOSIS — M109 Gout, unspecified: Secondary | ICD-10-CM | POA: Diagnosis present

## 2018-02-26 DIAGNOSIS — Z8679 Personal history of other diseases of the circulatory system: Secondary | ICD-10-CM

## 2018-02-26 DIAGNOSIS — R091 Pleurisy: Secondary | ICD-10-CM | POA: Diagnosis not present

## 2018-02-26 DIAGNOSIS — I371 Nonrheumatic pulmonary valve insufficiency: Secondary | ICD-10-CM | POA: Diagnosis present

## 2018-02-26 DIAGNOSIS — R0682 Tachypnea, not elsewhere classified: Secondary | ICD-10-CM | POA: Diagnosis not present

## 2018-02-26 DIAGNOSIS — R627 Adult failure to thrive: Secondary | ICD-10-CM | POA: Diagnosis present

## 2018-02-26 DIAGNOSIS — E876 Hypokalemia: Secondary | ICD-10-CM | POA: Diagnosis present

## 2018-02-26 DIAGNOSIS — R402360 Coma scale, best motor response, obeys commands, unspecified time: Secondary | ICD-10-CM | POA: Diagnosis present

## 2018-02-26 DIAGNOSIS — I509 Heart failure, unspecified: Secondary | ICD-10-CM

## 2018-02-26 DIAGNOSIS — I5031 Acute diastolic (congestive) heart failure: Secondary | ICD-10-CM | POA: Diagnosis present

## 2018-02-26 DIAGNOSIS — E039 Hypothyroidism, unspecified: Secondary | ICD-10-CM | POA: Diagnosis present

## 2018-02-26 DIAGNOSIS — R0602 Shortness of breath: Secondary | ICD-10-CM | POA: Diagnosis not present

## 2018-02-26 DIAGNOSIS — I272 Pulmonary hypertension, unspecified: Secondary | ICD-10-CM | POA: Diagnosis not present

## 2018-02-26 DIAGNOSIS — E1122 Type 2 diabetes mellitus with diabetic chronic kidney disease: Secondary | ICD-10-CM | POA: Diagnosis present

## 2018-02-26 DIAGNOSIS — I4891 Unspecified atrial fibrillation: Secondary | ICD-10-CM | POA: Diagnosis present

## 2018-02-26 DIAGNOSIS — Z79899 Other long term (current) drug therapy: Secondary | ICD-10-CM

## 2018-02-26 DIAGNOSIS — Z91041 Radiographic dye allergy status: Secondary | ICD-10-CM

## 2018-02-26 DIAGNOSIS — J918 Pleural effusion in other conditions classified elsewhere: Secondary | ICD-10-CM | POA: Diagnosis present

## 2018-02-26 DIAGNOSIS — I130 Hypertensive heart and chronic kidney disease with heart failure and stage 1 through stage 4 chronic kidney disease, or unspecified chronic kidney disease: Principal | ICD-10-CM | POA: Diagnosis present

## 2018-02-26 DIAGNOSIS — R64 Cachexia: Secondary | ICD-10-CM | POA: Diagnosis present

## 2018-02-26 DIAGNOSIS — N179 Acute kidney failure, unspecified: Secondary | ICD-10-CM | POA: Diagnosis present

## 2018-02-26 DIAGNOSIS — Z66 Do not resuscitate: Secondary | ICD-10-CM | POA: Diagnosis not present

## 2018-02-26 DIAGNOSIS — Z881 Allergy status to other antibiotic agents status: Secondary | ICD-10-CM

## 2018-02-26 DIAGNOSIS — J80 Acute respiratory distress syndrome: Secondary | ICD-10-CM | POA: Diagnosis not present

## 2018-02-26 DIAGNOSIS — I714 Abdominal aortic aneurysm, without rupture: Secondary | ICD-10-CM | POA: Diagnosis present

## 2018-02-26 DIAGNOSIS — Z7982 Long term (current) use of aspirin: Secondary | ICD-10-CM

## 2018-02-26 DIAGNOSIS — R402240 Coma scale, best verbal response, confused conversation, unspecified time: Secondary | ICD-10-CM | POA: Diagnosis present

## 2018-02-26 DIAGNOSIS — Z9981 Dependence on supplemental oxygen: Secondary | ICD-10-CM

## 2018-02-26 DIAGNOSIS — F329 Major depressive disorder, single episode, unspecified: Secondary | ICD-10-CM | POA: Diagnosis present

## 2018-02-26 DIAGNOSIS — I110 Hypertensive heart disease with heart failure: Secondary | ICD-10-CM | POA: Diagnosis not present

## 2018-02-26 DIAGNOSIS — I4892 Unspecified atrial flutter: Secondary | ICD-10-CM | POA: Diagnosis present

## 2018-02-26 DIAGNOSIS — R402140 Coma scale, eyes open, spontaneous, unspecified time: Secondary | ICD-10-CM | POA: Diagnosis present

## 2018-02-26 DIAGNOSIS — Z681 Body mass index (BMI) 19 or less, adult: Secondary | ICD-10-CM | POA: Diagnosis not present

## 2018-02-26 DIAGNOSIS — R0689 Other abnormalities of breathing: Secondary | ICD-10-CM | POA: Diagnosis not present

## 2018-02-26 DIAGNOSIS — M542 Cervicalgia: Secondary | ICD-10-CM | POA: Diagnosis present

## 2018-02-26 HISTORY — PX: IR THORACENTESIS ASP PLEURAL SPACE W/IMG GUIDE: IMG5380

## 2018-02-26 LAB — COMPREHENSIVE METABOLIC PANEL
ALBUMIN: 2.8 g/dL — AB (ref 3.5–5.0)
ALK PHOS: 87 U/L (ref 38–126)
ALT: 9 U/L (ref 0–44)
AST: 19 U/L (ref 15–41)
Anion gap: 10 (ref 5–15)
BUN: 9 mg/dL (ref 8–23)
CHLORIDE: 102 mmol/L (ref 98–111)
CO2: 29 mmol/L (ref 22–32)
CREATININE: 1.08 mg/dL — AB (ref 0.44–1.00)
Calcium: 8.8 mg/dL — ABNORMAL LOW (ref 8.9–10.3)
GFR calc Af Amer: 53 mL/min — ABNORMAL LOW (ref >60)
GFR calc non Af Amer: 46 mL/min — ABNORMAL LOW (ref >60)
GLUCOSE: 90 mg/dL (ref 70–99)
Potassium: 3.1 mmol/L — ABNORMAL LOW (ref 3.5–5.1)
SODIUM: 141 mmol/L (ref 135–145)
Total Bilirubin: 1.1 mg/dL (ref 0.3–1.2)
Total Protein: 7.2 g/dL (ref 6.5–8.1)

## 2018-02-26 LAB — I-STAT TROPONIN, ED: Troponin i, poc: 0 ng/mL (ref 0.00–0.08)

## 2018-02-26 LAB — BODY FLUID CELL COUNT WITH DIFFERENTIAL
Eos, Fluid: 2 %
Lymphs, Fluid: 72 %
MONOCYTE-MACROPHAGE-SEROUS FLUID: 12 % — AB (ref 50–90)
Neutrophil Count, Fluid: 14 % (ref 0–25)
Total Nucleated Cell Count, Fluid: 682 cu mm (ref 0–1000)

## 2018-02-26 LAB — CBC WITH DIFFERENTIAL/PLATELET
ABS IMMATURE GRANULOCYTES: 0 10*3/uL (ref 0.0–0.1)
Basophils Absolute: 0 10*3/uL (ref 0.0–0.1)
Basophils Relative: 0 %
Eosinophils Absolute: 0.1 10*3/uL (ref 0.0–0.7)
Eosinophils Relative: 2 %
HCT: 31.8 % — ABNORMAL LOW (ref 36.0–46.0)
HEMOGLOBIN: 10.7 g/dL — AB (ref 12.0–15.0)
IMMATURE GRANULOCYTES: 0 %
LYMPHS PCT: 34 %
Lymphs Abs: 2.5 10*3/uL (ref 0.7–4.0)
MCH: 27 pg (ref 26.0–34.0)
MCHC: 33.6 g/dL (ref 30.0–36.0)
MCV: 80.3 fL (ref 78.0–100.0)
MONO ABS: 0.6 10*3/uL (ref 0.1–1.0)
Monocytes Relative: 8 %
NEUTROS ABS: 4.1 10*3/uL (ref 1.7–7.7)
NEUTROS PCT: 56 %
Platelets: 334 10*3/uL (ref 150–400)
RBC: 3.96 MIL/uL (ref 3.87–5.11)
RDW: 14.6 % (ref 11.5–15.5)
WBC: 7.3 10*3/uL (ref 4.0–10.5)

## 2018-02-26 LAB — GRAM STAIN

## 2018-02-26 LAB — BRAIN NATRIURETIC PEPTIDE: B Natriuretic Peptide: 969.5 pg/mL — ABNORMAL HIGH (ref 0.0–100.0)

## 2018-02-26 MED ORDER — SODIUM CHLORIDE 0.9 % IV SOLN: 250.0000 mL | INTRAVENOUS | Status: DC | PRN

## 2018-02-26 MED ORDER — ASPIRIN EC 81 MG PO TBEC
81.0000 mg | DELAYED_RELEASE_TABLET | Freq: Every day | ORAL | Status: DC
  Administered 2018-02-26 – 2018-03-01 (×4): 81 mg via ORAL
  Filled 2018-02-26 (×4): qty 1

## 2018-02-26 MED ORDER — DONEPEZIL HCL 10 MG PO TABS
10.0000 mg | ORAL_TABLET | Freq: Every day | ORAL | Status: DC
  Administered 2018-02-26 – 2018-02-28 (×3): 10 mg via ORAL
  Filled 2018-02-26 (×3): qty 1

## 2018-02-26 MED ORDER — LIDOCAINE HCL (PF) 2 % IJ SOLN
INTRAMUSCULAR | Status: AC
  Filled 2018-02-26: qty 10

## 2018-02-26 MED ORDER — LIDOCAINE HCL (PF) 2 % IJ SOLN
INTRAMUSCULAR | Status: DC | PRN
  Administered 2018-02-26: 10 mL

## 2018-02-26 MED ORDER — ACETAMINOPHEN 325 MG PO TABS
650.0000 mg | ORAL_TABLET | Freq: Four times a day (QID) | ORAL | Status: DC | PRN
  Administered 2018-02-28: 650 mg via ORAL
  Filled 2018-02-26: qty 2

## 2018-02-26 MED ORDER — ALLOPURINOL 100 MG PO TABS
100.0000 mg | ORAL_TABLET | Freq: Every day | ORAL | Status: DC
  Administered 2018-02-26 – 2018-03-01 (×4): 100 mg via ORAL
  Filled 2018-02-26 (×4): qty 1

## 2018-02-26 MED ORDER — SODIUM CHLORIDE 0.9% FLUSH
3.0000 mL | Freq: Two times a day (BID) | INTRAVENOUS | Status: DC
  Administered 2018-02-27 – 2018-02-28 (×3): 3 mL via INTRAVENOUS

## 2018-02-26 MED ORDER — FUROSEMIDE 10 MG/ML IJ SOLN
40.0000 mg | Freq: Once | INTRAMUSCULAR | Status: AC
  Administered 2018-02-26: 40 mg via INTRAVENOUS
  Filled 2018-02-26: qty 4

## 2018-02-26 MED ORDER — IPRATROPIUM BROMIDE 0.02 % IN SOLN: 0.5000 mg | Freq: Four times a day (QID) | RESPIRATORY_TRACT | Status: DC | PRN

## 2018-02-26 MED ORDER — SODIUM CHLORIDE 0.9% FLUSH
3.0000 mL | Freq: Two times a day (BID) | INTRAVENOUS | Status: DC
  Administered 2018-02-26 – 2018-02-27 (×3): 3 mL via INTRAVENOUS

## 2018-02-26 MED ORDER — ACETAMINOPHEN 650 MG RE SUPP: 650.0000 mg | Freq: Four times a day (QID) | RECTAL | Status: DC | PRN

## 2018-02-26 MED ORDER — LEVOTHYROXINE SODIUM 25 MCG PO TABS
25.0000 ug | ORAL_TABLET | Freq: Every day | ORAL | Status: DC
  Administered 2018-02-27 – 2018-03-01 (×3): 25 ug via ORAL
  Filled 2018-02-26 (×3): qty 1

## 2018-02-26 MED ORDER — POTASSIUM CHLORIDE CRYS ER 10 MEQ PO TBCR
10.0000 meq | EXTENDED_RELEASE_TABLET | Freq: Every day | ORAL | Status: DC
  Administered 2018-02-26 – 2018-03-01 (×4): 10 meq via ORAL
  Filled 2018-02-26 (×6): qty 1

## 2018-02-26 MED ORDER — ISOSORBIDE MONONITRATE ER 30 MG PO TB24
30.0000 mg | ORAL_TABLET | Freq: Two times a day (BID) | ORAL | Status: DC
  Administered 2018-02-26 – 2018-03-01 (×6): 30 mg via ORAL
  Filled 2018-02-26 (×6): qty 1

## 2018-02-26 MED ORDER — DILTIAZEM HCL 30 MG PO TABS
30.0000 mg | ORAL_TABLET | Freq: Once | ORAL | Status: AC
  Administered 2018-02-26: 30 mg via ORAL
  Filled 2018-02-26: qty 1

## 2018-02-26 MED ORDER — LIDOCAINE HCL (PF) 2 % IJ SOLN
INTRAMUSCULAR | Status: AC
  Filled 2018-02-26: qty 20

## 2018-02-26 MED ORDER — SODIUM CHLORIDE 0.9% FLUSH: 3.0000 mL | INTRAVENOUS | Status: DC | PRN

## 2018-02-26 MED ORDER — METOPROLOL SUCCINATE ER 25 MG PO TB24
25.0000 mg | ORAL_TABLET | Freq: Every day | ORAL | Status: DC
  Administered 2018-02-26 – 2018-03-01 (×4): 25 mg via ORAL
  Filled 2018-02-26 (×5): qty 1

## 2018-02-26 MED ORDER — SERTRALINE HCL 50 MG PO TABS
50.0000 mg | ORAL_TABLET | Freq: Every day | ORAL | Status: DC
  Administered 2018-02-26 – 2018-03-01 (×4): 50 mg via ORAL
  Filled 2018-02-26 (×4): qty 1

## 2018-02-26 MED ORDER — FUROSEMIDE 10 MG/ML IJ SOLN
20.0000 mg | Freq: Two times a day (BID) | INTRAMUSCULAR | Status: DC
  Administered 2018-02-26 – 2018-02-27 (×2): 20 mg via INTRAVENOUS
  Filled 2018-02-26 (×2): qty 2

## 2018-02-26 NOTE — ED Triage Notes (Signed)
To ED via [facility] from home with c/o increased shortness of breath this am. Was in hospital recently for same. Pt receiving duoneb on arrival per EMS, also received SOlumedrol 125mg  IV per EMS.  Pt is alert. On home O2 at 2L/m/[place]

## 2018-02-26 NOTE — Care Management Note (Signed)
Case Management Note  Patient Details  Name: Nicole [name] MRN: [id number] Date of Birth: 1931/05/16  Subjective/Objective:    CHF               Action/Plan: Noted that patient has been admitted to the hospital 3 times in 6 months. PCP: [name], DO; has private insurance with [employer] with prescription drug coverage; she is established with [facility] for [facility] services as prior to admission. CM will continue to follow for progression of care.   Expected Discharge Date:    possibly 03/02/2018              Expected Discharge Plan:  [place]  Discharge planning Services  CM Consult  HH Arranged:  RN, Disease Management, PT, OT [facility] Agency:  [facility]  Status of Service:  In process, will continue to follow  Sherrilyn Rist [phone] 02/26/2018, 3:53 PM

## 2018-02-26 NOTE — ED Provider Notes (Signed)
[facility] EMERGENCY DEPARTMENT Provider Note   CSN: [id number] Arrival date & time: 02/26/18  0745     History   Chief Complaint Chief Complaint  Patient presents with  . Shortness of Breath  . Atrial Flutter    HPI Nicole [name] is a 84 y.o. female.  Patient is an 84 year old female with a history of nonruptured AAA, atrial fibrillation not on anticoagulation due to bleeding risk, diastolic CHF with pleural effusion that was recently drained last week, chronic kidney disease, diabetes who is presenting today with 4 days of worsening shortness of breath.  Patient wears 2 L of oxygen chronically at home and walks around her home with a cane.  She states she can never walk far before she becomes short of breath but in the last 4 days her shortness of breath has worsened.  It is significantly worse with lying down and because it was not getting any better she called EMS today.  She has had a mild cough but denies any productive cough or fever.  She has not noticed any swelling in her legs or weight gain that she is aware of.  She was recently hospitalized and discharged on 02/15/2018.  She is not familiar with what medication she is taking but at that time she was supposed to be restarted on Cardizem 30 mg twice daily as well as Toprol 24-hour tablet.  She is not sure if she is taking those.  She denies taking any of her medications this morning.  She denies chest pain, fever, abdominal pain, nausea or vomiting.  She states she does not use inhalers at home that she can recall.  She has no prior history of lung disease but EMS did give her albuterol, Atrovent and Solu-Medrol in route here because they were concerned for her tachypnea.  There was no improvement in symptoms.  The history is provided by the patient and the EMS personnel.  Shortness of Breath  This is a recurrent problem. The average episode lasts 4 days. The problem occurs continuously.Episode onset: 4 days  ago. The problem has been gradually worsening. Associated symptoms include cough and orthopnea. Pertinent negatives include no fever, no sputum production, no hemoptysis, no wheezing, no chest pain, no syncope, no vomiting, no abdominal pain, no leg pain and no leg swelling. It is unknown what precipitated the problem. She has tried nothing for the symptoms. The treatment provided no relief. She has had prior hospitalizations. She has had prior ED visits. Associated medical issues include heart failure.    Past Medical History:  Diagnosis Date  . AAA (abdominal aortic aneurysm) ([place])   . Anemia   . Anxiety   . Arthritis   . Atrial fibrillation ([place])   . Cervicalgia   . Cholelithiasis   . Chronic systolic heart failure ([place])   . CKD (chronic kidney disease)   . Colon cancer ([place]) 07/1997  . Depression   . Diabetes mellitus ([place])   . Dizziness and giddiness   . Electrolyte and fluid disorders not elsewhere classified   . GERD (gastroesophageal reflux disease)   . Gout, unspecified   . Hemorrhage of rectum and anus   . Hypercholesteremia   . Hypertension   . Hypoglycemia, unspecified   . Hypopotassemia   . Hypothyroidism   . Kidney stone    renal calculi  . Other chronic allergic conjunctivitis   . Other specified cardiac dysrhythmias(427.89)   . Pain in joint, lower leg   . Perforation  of bile duct   . Proteinuria   . Shortness of breath   . Small bowel obstruction due to adhesions ([place]) 05/16/2012  . Stroke ([place])   . Swelling, mass, or lump in head and neck   . Thoracic aneurysm without mention of rupture    4.6 cm Asc Aortic aneurysm, CT 07/2015    Patient Active Problem List   Diagnosis Date Noted  . Stage III pressure ulcer of sacral region ([place]) 02/15/2018  . Pleural effusion   . Acute on chronic respiratory failure with hypoxemia ([place]) 02/11/2018  . Hypoxemia 02/09/2018  . Atrial flutter ([place]) 10/28/2017  . Pressure injury of skin 10/11/2017  . Hyperkalemia  10/10/2017  . Dementia 10/10/2017  . CAD (coronary artery disease) 10/10/2017  . Elevated troponin 10/10/2017  . Stroke ([place])   . Shortness of breath   . Proteinuria   . Perforation of bile duct   . Pain in joint, lower leg   . Other chronic allergic conjunctivitis   . Kidney stone   . Hypothyroidism   . Hypopotassemia   . Hypoglycemia, unspecified   . Hypertension   . Hypercholesteremia   . Hemorrhage of rectum and anus   . Gout, unspecified   . GERD (gastroesophageal reflux disease)   . Dizziness and giddiness   . Diabetes mellitus ([place])   . Depression   . CKD (chronic kidney disease)   . Chronic systolic heart failure ([place])   . Cholelithiasis   . Cervicalgia   . Arthritis   . Anxiety   . AAA (abdominal aortic aneurysm) ([place])   . Moderate protein-calorie malnutrition ([place]) 07/11/2017  . Major depression 05/28/2017  . Adult failure to thrive 05/28/2017  . Acute encephalopathy 05/23/2017  . Acute lower UTI 05/23/2017  . Palliative care by specialist   . DNR (do not resuscitate)   . Bradyarrhythmia 01/11/2017  . Generalized weakness 01/11/2017  . General weakness 01/11/2017  . PE (pulmonary thromboembolism) ([place]) 08/24/2016  . Accelerated hypertension with diastolic congestive heart failure, NYHA class 3 ([place]) 01/05/2016  . Permanent atrial fibrillation ([place]) 01/05/2016  . Rib pain on right side 10/14/2015  . Hypothyroidism due to acquired atrophy of thyroid 10/14/2015  . Type 2 diabetes mellitus with diabetic nephropathy, without long-term current use of insulin ([place]) 10/14/2015  . Essential hypertension 10/14/2015  . Acute on chronic systolic heart failure ([place]) 08/02/2015  . Acute diastolic CHF (congestive heart failure) ([place]) 08/02/2015  . Bradycardia 07/27/2015  . Chronic anemia 07/27/2015  . Chest pain   . Vascular dementia 06/13/2015  . Memory loss 06/13/2015  . Left hemiparesis ([place])   . Chronic diastolic heart failure ([place])   . Type 2 diabetes mellitus  with peripheral neuropathy (HCC)   . ICH (intracerebral hemorrhage) ([place]) 04/04/2015  . Atrial fibrillation, chronic ([place])   . Hypertensive urgency   . SOB (shortness of breath) 04/03/2014  . Diabetic nephropathy ([place]) 09/07/2013  . Chronic constipation 03/25/2013  . Carpal tunnel syndrome 02/25/2013  . Hypokalemia 05/17/2012  . Small bowel obstruction due to adhesions ([place]) 05/16/2012  . Normal coronary arteries, 2008 05/15/2012  . Type II or unspecified type diabetes mellitus with neurological manifestations, not stated as uncontrolled(250.60) 05/15/2012  . Acute respiratory distress 05/15/2012  . Abdominal pain, acute, right upper quadrant 05/12/2012  . Cholecystitis chronic, acute, S/P lap cholecystectomy 05/14/12 05/12/2012  . Thoracic aortic aneurysm without rupture ([place])   . CHF, past NICM with an EF of 30%, most recent echo 2011 showed EF  to be45- 50%   . Anemia 08/09/2011  . History of colon cancer, stage III 08/09/2011  . Colon cancer ([place]) 07/23/1997    Past Surgical History:  Procedure Laterality Date  . CARDIAC CATHETERIZATION  2008   moderate severe pulm HTN; with elevted pulm capillary wedge pressure   . CHOLECYSTECTOMY  05/14/2012   Procedure: LAPAROSCOPIC CHOLECYSTECTOMY WITH INTRAOPERATIVE CHOLANGIOGRAM;  Surgeon: Haywood Lasso, MD;  Location: [facility];  Service: General;  Laterality: N/A;  . ERCP  05/17/2012   Procedure: ENDOSCOPIC RETROGRADE CHOLANGIOPANCREATOGRAPHY (ERCP);  Surgeon: Missy Sabins, MD;  Location: [facility];  Service: Gastroenterology;  Laterality: N/A;  . ERCP  08/12/2012   Procedure: ENDOSCOPIC RETROGRADE CHOLANGIOPANCREATOGRAPHY (ERCP);  Surgeon: Missy Sabins, MD;  Location[facility]COPY;  Service: Endoscopy;  Laterality: N/A;  . HEMICOLECTOMY  08/11/1997  . IR THORACENTESIS ASP PLEURAL SPACE W/IMG GUIDE  02/10/2018  . KIDNEY STONE SURGERY    . TRANSTHORACIC ECHOCARDIOGRAM  11/2009   EF 45-50%, mild-mod AV regurg; mild MR; mod TR; ascending aorta  mildly dilated     OB History   None      Home Medications    Prior to Admission medications   Medication Sig Start Date End Date Taking? Authorizing Provider  acetaminophen (TYLENOL) 650 MG CR tablet Take 650 mg by mouth every 8 (eight) hours as needed for pain.    [provider]  allopurinol (ZYLOPRIM) 100 MG tablet Take 1 tablet (100 mg total) by mouth daily. 08/21/17   [name], [name], DO  aspirin EC 81 MG EC tablet Take 1 tablet (81 mg total) by mouth daily. 10/17/17   Danford, Suann Larry, MD  diltiazem (CARDIZEM) 30 MG tablet Take 1 tablet (30 mg total) by mouth 2 (two) times daily. 02/15/18 02/15/19  Georgette Shell, MD  donepezil (ARICEPT) 10 MG tablet Take 1 tablet (10 mg total) by mouth at bedtime. 10/16/17   Danford, Suann Larry, MD  ferrous sulfate 325 (65 FE) MG EC tablet TAKE 1 TABLET (325 MG TOTAL) BY MOUTH 2 (TWO) TIMES DAILY WITH A MEAL. 01/06/18   [name], [name], DO  furosemide (LASIX) 20 MG tablet Take 1 tablet (20 mg total) by mouth daily. 02/15/18 02/15/19  Georgette Shell, MD  ipratropium (ATROVENT) 0.02 % nebulizer solution Take 2.5 mLs (0.5 mg total) by nebulization every 6 (six) hours as needed for wheezing or shortness of breath. 11/21/17   [name], [name], DO  isosorbide mononitrate (IMDUR) 30 MG 24 hr tablet Take 1 tablet (30 mg total) by mouth 2 (two) times daily. 08/08/17   Lauree Chandler, NP  levothyroxine (SYNTHROID, LEVOTHROID) 25 MCG tablet Take 1 tablet (25 mcg total) by mouth daily before breakfast. 11/15/17   Gildardo Cranker, DO  metoprolol succinate (TOPROL-XL) 25 MG 24 hr tablet Take 1 tablet (25 mg total) by mouth daily. 09/27/17   [name], [name], DO  OXYGEN Inhale 2 L into the lungs as needed.    [provider]  potassium chloride (K-DUR) 10 MEQ tablet Take 1 tablet (10 mEq total) by mouth daily. 02/15/18   Georgette Shell, MD  sertraline (ZOLOFT) 50 MG tablet TAKE 1 TABLET BY MOUTH EVERY DAY Patient taking differently:  TAKE 1 TABLET (50 MG TOTALLY) BY MOUTH EVERY DAY 12/26/17   [name], DO    Family History Family History  Problem Relation Age of Onset  . Heart disease Mother   . Stroke Father   . Hypertension Daughter   .  Hypertension Son   . Diabetes Sister   . Hypertension Son     Social History Social History   Tobacco Use  . Smoking status: Never Smoker  . Smokeless tobacco: Never Used  Substance Use Topics  . Alcohol use: No  . Drug use: No     Allergies   Iohexol; Iodinated diagnostic agents; and Z-pak [azithromycin]   Review of Systems Review of Systems  Constitutional: Negative for fever.  Respiratory: Positive for cough and shortness of breath. Negative for hemoptysis, sputum production and wheezing.   Cardiovascular: Positive for orthopnea. Negative for chest pain, leg swelling and syncope.  Gastrointestinal: Negative for abdominal pain and vomiting.  All other systems reviewed and are negative.    Physical Exam Updated Vital Signs BP (!) 174/113 (BP Location: Left Arm)   Pulse (!) 115   Temp 98.9 F (37.2 C) (Temporal)   Resp 20   Ht 5\' 5"  (1.651 m)   Wt 50.3 kg (111 lb)   SpO2 100%   BMI 18.47 kg/m   Physical Exam  Constitutional: She appears well-developed. She appears cachectic. She appears distressed.  HENT:  Head: Normocephalic and atraumatic.  Eyes: Pupils are equal, round, and reactive to light. EOM are normal.  Cardiovascular: Normal rate, regular rhythm, normal heart sounds and intact distal pulses. Exam reveals no friction rub.  No murmur heard. Pulmonary/Chest: Accessory muscle usage present. Tachypnea noted. She has decreased breath sounds in the right lower field and the left lower field. She has no wheezes. She has rales in the right lower field and the left lower field.  Abdominal: Soft. Bowel sounds are normal. She exhibits no distension. There is no tenderness. There is no rebound and no guarding.  Musculoskeletal: Normal range of  motion. She exhibits no tenderness.  No edema  Neurological: She is alert. No cranial nerve deficit.  And oriented to person and place  Skin: Skin is warm and dry. Capillary refill takes less than 2 seconds. No rash noted.  Psychiatric: She has a normal mood and affect. Her behavior is normal.  Nursing note and vitals reviewed.    ED Treatments / Results  Labs (all labs ordered are listed, but only abnormal results are displayed) Labs Reviewed  CBC WITH DIFFERENTIAL/PLATELET - Abnormal; Notable for the following components:      Result Value   Hemoglobin 10.7 (*)    HCT 31.8 (*)    All other components within normal limits  COMPREHENSIVE METABOLIC PANEL - Abnormal; Notable for the following components:   Potassium 3.1 (*)    Creatinine, Ser 1.08 (*)    Calcium 8.8 (*)    Albumin 2.8 (*)    GFR calc non Af Amer 46 (*)    GFR calc Af Amer 53 (*)    All other components within normal limits  BRAIN NATRIURETIC PEPTIDE - Abnormal; Notable for the following components:   B Natriuretic Peptide 969.5 (*)    All other components within normal limits  I-STAT TROPONIN, ED    EKG EKG Interpretation  Date/Time:  Wednesday February 26 2018 07:54:36 EDT Ventricular Rate:  116 PR Interval:    QRS Duration: 118 QT Interval:  407 QTC Calculation: 544 R Axis:   59 Text Interpretation:  Atrial fibrillation Incomplete right bundle branch block Baseline wander in lead(s) V6 No significant change since last tracing Confirmed by Blanchie Dessert ([id number]) on 02/26/2018 7:58:02 AM   Radiology Dg Chest Port 1 View  Result Date: 02/26/2018 CLINICAL DATA:  Shortness of breath. EXAM: PORTABLE CHEST 1 VIEW COMPARISON:  Radiograph February 12, 2018. FINDINGS: Stable cardiomegaly. Atherosclerosis of thoracic aorta is noted. No pneumothorax is noted. Increased bibasilar opacities are noted concerning for pleural effusions with associated atelectasis, right greater than left. Bony thorax is unremarkable.  IMPRESSION: Increased bibasilar opacities are noted concerning for pleural effusions with associated atelectasis, right greater than left. Aortic Atherosclerosis (ICD10-I70.0). Electronically Signed   By: Marijo Conception, M.D.   On: 02/26/2018 08:54    Procedures Procedures (including critical care time)  Medications Ordered in ED Medications  furosemide (LASIX) injection 40 mg (has no administration in time range)  diltiazem (CARDIZEM) tablet 30 mg (30 mg Oral Given 02/26/18 0946)     Initial Impression / Assessment and Plan / ED Course  I have reviewed the triage vital signs and the nursing notes.  Pertinent labs & imaging results that were available during my care of the patient were reviewed by me and considered in my medical decision making (see chart for details).     Elderly patient with multiple medical problems presenting today with dyspnea and concern for CHF exacerbation.  Patient recently had pleural effusion 1 L drained last week in the setting of diastolic CHF exacerbation and acute kidney injury.  Patient was breathing better before discharge home and had been restarted on 20 mg of Lasix daily as well as Cardizem for rate control for her atrial fibrillation.  Patient cannot be anticoagulated due to bleeding risk.  However on exam today patient appears short of breath with decreased breath sounds and some rales in the lower lobes.  Heart rates ranged between 100-120 and it is unclear if she took any of her medications this morning.  She denies any infectious symptoms such as fever or productive cough.  Lower suspicion for pneumonia.  She is denying any chest pain or abdominal pain.  Low suspicion for abdominal pathology at this time.  Lower suspicion for ACS, dissection or PE. CBC, CMP, BNP, troponin is pending.  Patient's EKG today shows no change in patient is in atrial fibrillation.  We will give her home dose of Cardizem 30 mg.  Currently oxygen saturation is 100% on her home 2  L  10:55 AM CBC within normal limits, troponin is negative, CMP with stable creatinine of 1.08 and mild hypokalemia of 3.1.  BNP again is elevated today at greater than 900 and chest x-ray is consistent with bilateral pleural effusions and fluid overload.  Patient was given IV Lasix.  Heart rate improved to 90s with 30 mg of Cardizem.  Will admit for CHF exacerbation and diuresis.  CRITICAL CARE Performed by: Margarie Mcguirt Total critical care time: 30 minutes Critical care time was exclusive of separately billable procedures and treating other patients. Critical care was necessary to treat or prevent imminent or life-threatening deterioration. Critical care was time spent personally by me on the following activities: development of treatment plan with patient and/or surrogate as well as nursing, discussions with consultants, evaluation of patient's response to treatment, examination of patient, obtaining history from patient or surrogate, ordering and performing treatments and interventions, ordering and review of laboratory studies, ordering and review of radiographic studies, pulse oximetry and re-evaluation of patient's condition.   Final Clinical Impressions(s) / ED Diagnoses   Final diagnoses:  Acute on chronic congestive heart failure, unspecified heart failure type [facility])    ED Discharge Orders    None       Blanchie Dessert, MD 02/26/18 1057

## 2018-02-26 NOTE — H&P (Signed)
History and Physical  Nicole [name]:[id number] DOB: 01-02-1931 DOA: 02/26/2018  PCP: [name], DO   Chief Complaint: Short of breath  HPI:  84 year old woman PMH chronic hypoxic respiratory failure on 3 L nasal cannula, diastolic CHF, atrial fibrillation/flutter not on anticoagulation secondary to bleeding risk, diabetes mellitus, dementia, presented to the emergency department with worsening shortness of breath.  Chest x-ray suggested pleural effusion and volume overload she was referred for further treatment and evaluation.  Patient has dementia and history appears to be unreliable.  She does report shortness of breath which is now better.  Caregiver at bedside provides more history (with patient Monday through Friday, Sunday, since 2016).  She reports the patient has been doing well since last discharge, and appears to have been at baseline until last evening when she became significantly short of breath.  No specific aggravating or alleviating factors known.  No chest pain noted.  No systemic symptoms or vomiting noted.  It was discovered this morning that there was a hole in her oxygen tubing, she has no further tubing at home and was sent to the emergency department.  No weight gain has been noted by caretaker, no significant swelling noted.  Patient was noted to be quite short of breath this morning but appears better currently.  No chest pain.  Review of systems unrevealing but not reliable.  ED Course: Afebrile, respiratory rate up to the 30s.  Hypertensive.  Minimally tachycardic.  Oxygen saturation stable.  Treated with diltiazem and Lasix in the emergency department.  Review of Systems:  Unreliable secondary to dementia but: Negative for fever, visual changes, sore throat, rash, new muscle aches, chest pain, dysuria, bleeding, n/v/abdominal pain.  Past Medical History:  Diagnosis Date  . AAA (abdominal aortic aneurysm) ([place])   . Anemia   . Anxiety   . Arthritis   .  Atrial fibrillation ([place])   . Cervicalgia   . Cholelithiasis   . Chronic systolic heart failure ([place])   . CKD (chronic kidney disease)   . Colon cancer ([place]) 07/1997  . Depression   . Diabetes mellitus ([place])   . Dizziness and giddiness   . Electrolyte and fluid disorders not elsewhere classified   . GERD (gastroesophageal reflux disease)   . Gout, unspecified   . Hemorrhage of rectum and anus   . Hypercholesteremia   . Hypertension   . Hypoglycemia, unspecified   . Hypopotassemia   . Hypothyroidism   . Kidney stone    renal calculi  . Other chronic allergic conjunctivitis   . Other specified cardiac dysrhythmias(427.89)   . Pain in joint, lower leg   . Perforation of bile duct   . Proteinuria   . Shortness of breath   . Small bowel obstruction due to adhesions ([place]) 05/16/2012  . Stroke ([place])   . Swelling, mass, or lump in head and neck   . Thoracic aneurysm without mention of rupture    4.6 cm Asc Aortic aneurysm, CT 07/2015    Past Surgical History:  Procedure Laterality Date  . CARDIAC CATHETERIZATION  2008   moderate severe pulm HTN; with elevted pulm capillary wedge pressure   . CHOLECYSTECTOMY  05/14/2012   Procedure: LAPAROSCOPIC CHOLECYSTECTOMY WITH INTRAOPERATIVE CHOLANGIOGRAM;  Surgeon: Haywood Lasso, MD;  Location: [place];  Service: General;  Laterality: N/A;  . ERCP  05/17/2012   Procedure: ENDOSCOPIC RETROGRADE CHOLANGIOPANCREATOGRAPHY (ERCP);  Surgeon: Missy Sabins, MD;  Location: [place];  Service: Gastroenterology;  Laterality: N/A;  .  ERCP  08/12/2012   Procedure: ENDOSCOPIC RETROGRADE CHOLANGIOPANCREATOGRAPHY (ERCP);  Surgeon: Missy Sabins, MD;  Location: [facility] ENDOSCOPY;  Service: Endoscopy;  Laterality: N/A;  . HEMICOLECTOMY  08/11/1997  . IR THORACENTESIS ASP PLEURAL SPACE W/IMG GUIDE  02/10/2018  . KIDNEY STONE SURGERY    . TRANSTHORACIC ECHOCARDIOGRAM  11/2009   EF 45-50%, mild-mod AV regurg; mild MR; mod TR; ascending aorta mildly dilated      reports that she has never smoked. She has never used smokeless tobacco. She reports that she does not drink alcohol or use drugs. Mobility: Ambulatory without cane or walker.  No falls reported.  Needs assistance with all ADLs.  Allergies  Allergen Reactions  . Iohexol Nausea And Vomiting  . Iodinated Diagnostic Agents Nausea And Vomiting  . Z-Pak [Azithromycin] Other (See Comments)    Reaction unknown by family    Family History  Problem Relation Age of Onset  . Heart disease Mother   . Stroke Father   . Hypertension Daughter   . Hypertension Son   . Diabetes Sister   . Hypertension Son      Prior to Admission medications   Medication Sig Start Date End Date Taking? Authorizing Provider  acetaminophen (TYLENOL) 650 MG CR tablet Take 650 mg by mouth every 8 (eight) hours as needed for pain.   Yes [provider]  allopurinol (ZYLOPRIM) 100 MG tablet Take 1 tablet (100 mg total) by mouth daily. 08/21/17  Yes [name], [name], DO  aspirin EC 81 MG EC tablet Take 1 tablet (81 mg total) by mouth daily. 10/17/17  Yes Danford, Suann Larry, MD  diltiazem (CARDIZEM) 30 MG tablet Take 1 tablet (30 mg total) by mouth 2 (two) times daily. 02/15/18 02/15/19 Yes Georgette Shell, MD  donepezil (ARICEPT) 10 MG tablet Take 1 tablet (10 mg total) by mouth at bedtime. 10/16/17  Yes Danford, Suann Larry, MD  ferrous sulfate 325 (65 FE) MG EC tablet TAKE 1 TABLET (325 MG TOTAL) BY MOUTH 2 (TWO) TIMES DAILY WITH A MEAL. 01/06/18  Yes [name], [name], DO  furosemide (LASIX) 20 MG tablet Take 1 tablet (20 mg total) by mouth daily. 02/15/18 02/15/19 Yes Georgette Shell, MD  ipratropium (ATROVENT) 0.02 % nebulizer solution Take 2.5 mLs (0.5 mg total) by nebulization every 6 (six) hours as needed for wheezing or shortness of breath. 11/21/17  Yes [name], [name], DO  isosorbide mononitrate (IMDUR) 30 MG 24 hr tablet Take 1 tablet (30 mg total) by mouth 2 (two) times daily. 08/08/17  Yes Lauree Chandler, NP  levothyroxine (SYNTHROID, LEVOTHROID) 25 MCG tablet Take 1 tablet (25 mcg total) by mouth daily before breakfast. 11/15/17  Yes Gildardo Cranker, DO  metoprolol succinate (TOPROL-XL) 25 MG 24 hr tablet Take 1 tablet (25 mg total) by mouth daily. 09/27/17  Yes [name], [name], DO  OXYGEN Inhale 2 L into the lungs continuous.    Yes [provider]  potassium chloride (K-DUR) 10 MEQ tablet Take 1 tablet (10 mEq total) by mouth daily. 02/15/18  Yes Georgette Shell, MD  sertraline (ZOLOFT) 50 MG tablet TAKE 1 TABLET BY MOUTH EVERY DAY Patient taking differently: TAKE 1 TABLET (50 MG TOTALLY) BY MOUTH EVERY DAY 12/26/17  Yes [name], DO    Physical Exam: Vitals:   02/26/18 1245 02/26/18 1400  BP: (!) 185/116 (!) 186/95  Pulse: 73 84  Resp: (!) 33 (!) 31  Temp:    SpO2: 91% 93%  Constitutional:   . Appears calm and comfortable Eyes:  . pupils and irises appear normal . Normal lids  ENMT:  . Mildly hard of hearing . Lips appear normal Neck:  . neck appears normal, no masses . no thyromegaly Respiratory:  . Clear anteriorly, diminished breath sounds posteriorly especially on the right.  Inspiratory crackles posteriorly.  No wheezes or rhonchi. Marland Kitchen Respiratory effort mildly increased. Cardiovascular:  . Irregular, no m/r/g . Trace-1+ pedal edema on the left, trace pedal edema on the right. . Normal dorsalis pedis pulses bilaterally Abdomen:  . Soft, nontender, nondistended . No hernias noted Musculoskeletal:  . Digits/nails BUE: no clubbing, cyanosis, petechiae, infection . RUE, LUE, RLE, LLE   o strength and tone normal, no atrophy, no abnormal movements o No tenderness, masses Skin:  . No rashes, lesions, ulcers . palpation of skin: no induration or nodules Psychiatric:  . Mental status o Mood, affect appropriate . judgment and insight appear impaired  I have personally reviewed following labs and imaging studies  Labs:   Potassium 3.1,  remainder BMP unremarkable.  Hepatic function panel unremarkable.  BNP elevated, 969.  Troponin negative.  Hemoglobin stable, 10.7, remainder CBC unremarkable  Imaging studies:   Chest x-ray bilateral pleural effusions, there appears to be some increase in the right compared to previous study.  There appears to be a small left pleural effusion which is new.  Medical tests:   EKG independently reviewed: Atrial fibrillation with ventricular rate 116.  No acute changes  Review and summation of old records:   Pathology/cytology from 7/22 thoracentesis, no malignant cells seen  Echocardiogram 09/2017 LVEF 55-60%.  RV systolic function mild to moderately reduced.  Right atrium severely dilated.  Moderate tricuspid and pulmonic regurgitation  Discharged 7/27, Treated for acute on chronic hypoxic respiratory failure, right pleural effusion, status post thoracentesis 1 L removed, discharged on lower Lasix dose of 20 mg daily compared to previous dose of 40 mg daily.   Principal Problem:   Acute on chronic respiratory failure with hypoxia (HCC) Active Problems:   Acute on chronic diastolic CHF (congestive heart failure) (HCC)   Pleural effusion on right   Assessment/Plan Acute on chronic hypoxic respiratory failure, on 3 L nasal cannula at home.  Acute component multifactorial including oxygen tube dysfunction, increased right pleural effusion, acute on chronic diastolic CHF. --Treat CHF and pleural effusion as below.  Acute on chronic diastolic CHF, last echo did not comment on diastolic function.  Also noted to have moderate tricuspid and pulmonic valve regurgitation.  Mild to moderate RV systolic dysfunction.  Followed by Dr. Debara Pickett, last seen 10/2017.  Was discharged on lower dose of Lasix. --IV Lasix.  Strict I/O.  Follow daily BMP.  Right greater than left pleural effusions.  Presumably related to CHF. Symptomatically improved today.  Follow clinically, IV diuresis as above. --Although  symptomatically improved, still tachypneic, think would benefit from repeat thoracentesis, nonurgent.  Chronic hypoxic respiratory failure on 3 L nasal cannula at home --Continue supplemental oxygen.  Atrial fibrillation, flutter --Continue Toprol-XL.  Rate control appears adequate at this point.  Not a candidate for anticoagulation secondary to history of bleeding, falls.  Dementia  --Appears stable.  Continue Aricept.  Adult failure to thrive with ongoing weight loss.  Reportedly followed by palliative care in the outpatient setting.  PMH intracerebral hemorrhage  Severity of Illness: The appropriate patient status for this patient is INPATIENT. Inpatient status is judged to be reasonable and necessary in order to provide the  required intensity of service to ensure the patient's safety. The patient's presenting symptoms, physical exam findings, and initial radiographic and laboratory data in the context of their chronic comorbidities is felt to place them at high risk for further clinical deterioration. Furthermore, it is not anticipated that the patient will be medically stable for discharge from the hospital within 2 midnights of admission.   * I certify that at the point of admission it is my clinical judgment that the patient will require inpatient hospital care spanning beyond 2 midnights from the point of admission due to high intensity of service, high risk for further deterioration and high frequency of surveillance required.*  DVT prophylaxis: SCDs Code Status: DNR/DNI Family Communication: daughter Irven [place] confirms DNR status Consults called: none  Time spent: 60 minutes  Murray Hodgkins, MD  [facility] Direct contact: [phone] --Via [place]  --[email or web address]; password [email or web address]  7PM-7AM contact night coverage as above  02/26/2018, 2:31 PM

## 2018-02-26 NOTE — Procedures (Signed)
PROCEDURE SUMMARY:  Successful image-guided right thoracentesis. Yielded 1.2 liters of serosanguineous fluid. Patient tolerated procedure well. No immediate complications.  Specimen was sent for labs. CXR ordered.  Joaquim Nam PA-C 02/26/2018 5:23 PM

## 2018-02-26 NOTE — Progress Notes (Signed)
PT Cancellation Note  Patient Details Name: Nicole [name] MRN: [id number] DOB: 08-31-1930   Cancelled Treatment:    Reason Eval/Treat Not Completed: Patient at procedure or test/unavailable. Will follow.  Ellamae Sia, PT, DPT Acute Rehabilitation Services  Pager: [phone]    Willy Eddy 02/26/2018, 5:09 PM

## 2018-02-26 NOTE — Progress Notes (Signed)
Pt vitals documented, CCMD set up  Will complete admission database when family arrives

## 2018-02-26 NOTE — ED Notes (Signed)
Heart Healthy Diet was ordered for Lunch. 

## 2018-02-27 ENCOUNTER — Encounter (HOSPITAL_COMMUNITY): Payer: Self-pay | Admitting: Physician Assistant

## 2018-02-27 LAB — BASIC METABOLIC PANEL
Anion gap: 8 (ref 5–15)
BUN: 21 mg/dL (ref 8–23)
CALCIUM: 8.7 mg/dL — AB (ref 8.9–10.3)
CO2: 31 mmol/L (ref 22–32)
CREATININE: 1.7 mg/dL — AB (ref 0.44–1.00)
Chloride: 104 mmol/L (ref 98–111)
GFR calc Af Amer: 31 mL/min — ABNORMAL LOW (ref >60)
GFR, EST NON AFRICAN AMERICAN: 26 mL/min — AB (ref >60)
Glucose, Bld: 95 mg/dL (ref 70–99)
Potassium: 3.4 mmol/L — ABNORMAL LOW (ref 3.5–5.1)
SODIUM: 143 mmol/L (ref 135–145)

## 2018-02-27 LAB — GLUCOSE, CAPILLARY
GLUCOSE-CAPILLARY: 99 mg/dL (ref 70–99)
Glucose-Capillary: 86 mg/dL (ref 70–99)
Glucose-Capillary: 111 mg/dL — ABNORMAL HIGH (ref 70–99)

## 2018-02-27 NOTE — Plan of Care (Signed)
  Problem: Coping: Goal: Level of anxiety will decrease Outcome: Completed/Met   Problem: Pain Managment: Goal: General experience of comfort will improve Outcome: Completed/Met   Problem: Safety: Goal: Ability to remain free from injury will improve Outcome: Completed/Met

## 2018-02-27 NOTE — Evaluation (Signed)
Physical Therapy Evaluation Patient Details Name: BRILEY BUMGARNER MRN: [id number] DOB: 1931/07/15 Today's Date: 02/27/2018   History of Present Illness  84 yo admitted with SOB and pleural effusion. PMhx: dementia, CHf, chronic respiratory failure on home O2, Afib, DM, CKD  Clinical Impression  Pt very lethargic today. Pt able to perform sitting balance EOB after stimulation but returns to eyes closed quickly. Pt able to perform hand over hand assist to wash face, able to state in sitting she isn't hungry but otherwise very limited interaction this session. Pt currently total assist due to lethargy with pt previously walking 80' 2 weeks ago. Pt with decreased cognition, balance, function and strength who will benefit from acute therapy to maximize mobility, function and safety. Recommend daily attempts with nursing for mobility.   SpO2 99% on 3L    Follow Up Recommendations Home health PT;Supervision/Assistance - 24 hour;SNF(HH if able to progress functionally and has 24hr assist)    Equipment Recommendations  None recommended by PT    Recommendations for Other Services       Precautions / Restrictions Precautions Precautions: Fall      Mobility  Bed Mobility Overal bed mobility: Needs Assistance Bed Mobility: Supine to Sit;Sit to Supine     Supine to sit: Total assist Sit to supine: Mod assist   General bed mobility comments: supine to sit total assist as pt not assisting or responding to cues. With return to bed pt able to lower trunk to surface and bring left leg onto bed with assist for right leg. Total assist +2 to scoot to [facility]  Transfers Overall transfer level: Needs assistance   Transfers: Sit to/from Stand Sit to Stand: Total assist;+2 physical assistance         General transfer comment: pt able to sit EOB with progression from mod -minguard assist for grossly 5 min total, 3 min unsupported. With cues and assist to stand pt demonstrating very limited assist  requiring total assist +2 to stand with pt stating she doesn't want to get up and returned to supine  Ambulation/Gait             General Gait Details: unable due to lethargy  Stairs            Wheelchair Mobility    Modified Rankin (Stroke Patients Only)       Balance Overall balance assessment: Needs assistance Sitting-balance support: Feet supported;No upper extremity supported Sitting balance-Leahy Scale: Zero Sitting balance - Comments: progressing from mod to minguard EOB   Standing balance support: Bilateral upper extremity supported Standing balance-Leahy Scale: Zero                               Pertinent Vitals/Pain Pain Assessment: (PAINAD= 0)    Home Living Family/patient expects to be discharged to:: Private residence Living Arrangements: Spouse/significant other Available Help at Discharge: Family;Personal care attendant;Available 24 hours/day Type of Home: House Home Access: Stairs to enter Entrance Stairs-Rails: Psychiatric nurse of Steps: 4 Home Layout: One level Home Equipment: Walker - 2 wheels;Cane - single point;Shower seat      Prior Function Level of Independence: Needs assistance   Gait / Transfers Assistance Needed: per chart walking with RW  ADL's / Homemaking Assistance Needed: Pt able to perform BADLs with assist as needed. Family/friends/aide do IADLs  Comments: per chart has supervision 24hrs     Hand Dominance        Extremity/Trunk  Assessment   Upper Extremity Assessment Upper Extremity Assessment: Generalized weakness;Difficult to assess due to impaired cognition    Lower Extremity Assessment Lower Extremity Assessment: Generalized weakness;Difficult to assess due to impaired cognition    Cervical / Trunk Assessment Cervical / Trunk Assessment: Kyphotic  Communication      Cognition Arousal/Alertness: Lethargic Behavior During Therapy: Flat affect Overall Cognitive Status: No  family/caregiver present to determine baseline cognitive functioning                                 General Comments: pt extremely lethargic even in sitting and with attempt to stand, no family present to provide baseline      General Comments      Exercises     Assessment/Plan    PT Assessment Patient needs continued PT services  PT Problem List Decreased strength;Decreased mobility;Decreased safety awareness;Decreased activity tolerance;Decreased cognition;Decreased balance;Decreased knowledge of use of DME       PT Treatment Interventions DME instruction;Gait training;Stair training;Functional mobility training;Therapeutic activities;Therapeutic exercise;Balance training;Neuromuscular re-education;Cognitive remediation;Patient/family education    PT Goals (Current goals can be found in the Care Plan section)  Acute Rehab PT Goals PT Goal Formulation: Patient unable to participate in goal setting Time For Goal Achievement: 03/13/18 Potential to Achieve Goals: Fair    Frequency Min 3X/week   Barriers to discharge        Co-evaluation               AM-PAC PT "6 Clicks" Daily Activity  Outcome Measure Difficulty turning over in bed (including adjusting bedclothes, sheets and blankets)?: Unable Difficulty moving from lying on back to sitting on the side of the bed? : Unable Difficulty sitting down on and standing up from a chair with arms (e.g., wheelchair, bedside commode, etc,.)?: Unable Help needed moving to and from a bed to chair (including a wheelchair)?: Total Help needed walking in hospital room?: Total Help needed climbing 3-5 steps with a railing? : Total 6 Click Score: 6    End of Session Equipment Utilized During Treatment: Gait belt Activity Tolerance: Patient limited by lethargy Patient left: in bed;with call bell/phone within reach;with bed alarm set Nurse Communication: Mobility status PT Visit Diagnosis: Unsteadiness on feet  (R26.81);Other abnormalities of gait and mobility (R26.89);Other (comment);Muscle weakness (generalized) (M62.81)    Time: 7915-0569 PT Time Calculation (min) (ACUTE ONLY): 18 min   Charges:   PT Evaluation $PT Eval Moderate Complexity: 1 [address], PT [phone]   Sandy Salaam Avaiyah Strubel 02/27/2018, 9:21 AM

## 2018-02-27 NOTE — Progress Notes (Signed)
PROGRESS NOTE  Nicole [name] ZRA:[id number] DOB: 23-Nov-1930 DOA: 02/26/2018 PCP: [name], DO  Brief Narrative: 84 year old woman PMH chronic hypoxic respiratory failure on 3 L nasal cannula, diastolic CHF, atrial fibrillation/flutter not on anticoagulation secondary to bleeding risk, diabetes mellitus, dementia, presented to the emergency department with worsening shortness of breath.  Chest x-ray suggested pleural effusion and volume overload she was referred for further treatment and evaluation.  Assessment/Plan Acute on chronic hypoxic respiratory failure, on 3 L nasal cannula at home. Initial component including oxygen tube dysfunction.  Significant contributing factors right pleural effusion and acute on chronic diastolic CHF. --Much improved.  Acute component appears resolved.  Acute on chronic diastolic CHF, last echo did not comment on diastolic function.  Also noted to have moderate tricuspid and pulmonic valve regurgitation.  Mild to moderate RV systolic dysfunction.  Followed by Dr. Debara Pickett, last seen 10/2017. Was discharged on lower dose of Lasix. --Appears euvolemic at this point.  With elevated creatinine, discontinue diuretic.  Right greater than left pleural effusions.  Presumably related to CHF.  --Clinically patient appears much improved status post large-volume right thoracentesis.  Culture data pending.  Doubt infection.  Chronic hypoxic respiratory failure on 3 L nasal cannula at home --Appears stable.  Continue supplemental oxygen.  Atrial fibrillation, flutter --Continue Toprol-XL.    Rate controlled. Not a candidate for anticoagulation secondary to history of bleeding, falls.  Dementia  --Remains stable.  Continue Aricept.  Adult failure to thrive with ongoing weight loss.  Reportedly followed by palliative care in the outpatient setting.   Appears much better today.  Repeat chest x-ray to assess for reaccumulation in a.m.  Likely home tomorrow if  remains stable and renal function stable.  DVT prophylaxis: SCDs Code Status: DNR Family Communication: daughter Peter Congo at bedside Disposition Plan: HHPT    Murray Hodgkins, MD  [facility] Direct contact: [phone] --Via amion app [place]  --[email or web address]; password [email or web address]  7PM-7AM contact night coverage as above 02/27/2018, 6:10 PM  LOS: 1 day   Consultants:    Procedures:  Right thoracentesis yielded 1.2 liters of serosanguineous fluid.  Antimicrobials:    Interval history/Subjective: Feels much better.  Breathing "beautiful".  Objective: Vitals:  Vitals:   02/27/18 0915 02/27/18 1203  BP:  126/67  Pulse:  74  Resp:  20  Temp:  98.1 F (36.7 C)  SpO2: 99% 100%    Exam:  Constitutional:  . Appears calm and comfortable Respiratory:  . CTA bilaterally, no w/r/r.  . Respiratory effort normal.  Cardiovascular:  . RRR, no m/r/g . No LE extremity edema   Psychiatric:  . Mental status o Mood, affect appropriate  I have personally reviewed the following:   Labs:  Blood sugar stable  Creatinine has elevated from 1.08, 1.7  Pleural fluid red in color, culture pending.  Gram stain unremarkable.  Scheduled Meds: . allopurinol  100 mg Oral Daily  . aspirin EC  81 mg Oral Daily  . donepezil  10 mg Oral QHS  . isosorbide mononitrate  30 mg Oral BID  . levothyroxine  25 mcg Oral QAC breakfast  . metoprolol succinate  25 mg Oral Daily  . potassium chloride  10 mEq Oral Daily  . sertraline  50 mg Oral Daily  . sodium chloride flush  3 mL Intravenous Q12H  . sodium chloride flush  3 mL Intravenous Q12H   Continuous Infusions: . sodium chloride      Principal Problem:   Acute on chronic respiratory  failure with hypoxia (HCC) Active Problems:   Acute on chronic diastolic CHF (congestive heart failure) (HCC)   Pleural effusion on right   LOS: 1 day

## 2018-02-28 ENCOUNTER — Inpatient Hospital Stay (HOSPITAL_COMMUNITY): Payer: Medicare HMO

## 2018-02-28 DIAGNOSIS — N179 Acute kidney failure, unspecified: Secondary | ICD-10-CM

## 2018-02-28 LAB — BASIC METABOLIC PANEL
ANION GAP: 11 (ref 5–15)
BUN: 31 mg/dL — AB (ref 8–23)
CO2: 27 mmol/L (ref 22–32)
Calcium: 8.2 mg/dL — ABNORMAL LOW (ref 8.9–10.3)
Chloride: 105 mmol/L (ref 98–111)
Creatinine, Ser: 1.73 mg/dL — ABNORMAL HIGH (ref 0.44–1.00)
GFR calc Af Amer: 30 mL/min — ABNORMAL LOW (ref >60)
GFR, EST NON AFRICAN AMERICAN: 26 mL/min — AB (ref >60)
Glucose, Bld: 71 mg/dL (ref 70–99)
POTASSIUM: 3.6 mmol/L (ref 3.5–5.1)
SODIUM: 143 mmol/L (ref 135–145)

## 2018-02-28 LAB — GLUCOSE, CAPILLARY
GLUCOSE-CAPILLARY: 76 mg/dL (ref 70–99)
GLUCOSE-CAPILLARY: 71 mg/dL (ref 70–99)

## 2018-02-28 IMAGING — DX DG CHEST 2V
2 series · 2 of 2 positions shown · non-contrast
Comparison: 02/17/2016 chest radiograph.

CLINICAL DATA: Atrial fibrillation.  Shortness of breath.

EXAM:
CHEST  2 VIEW

[chest pa]
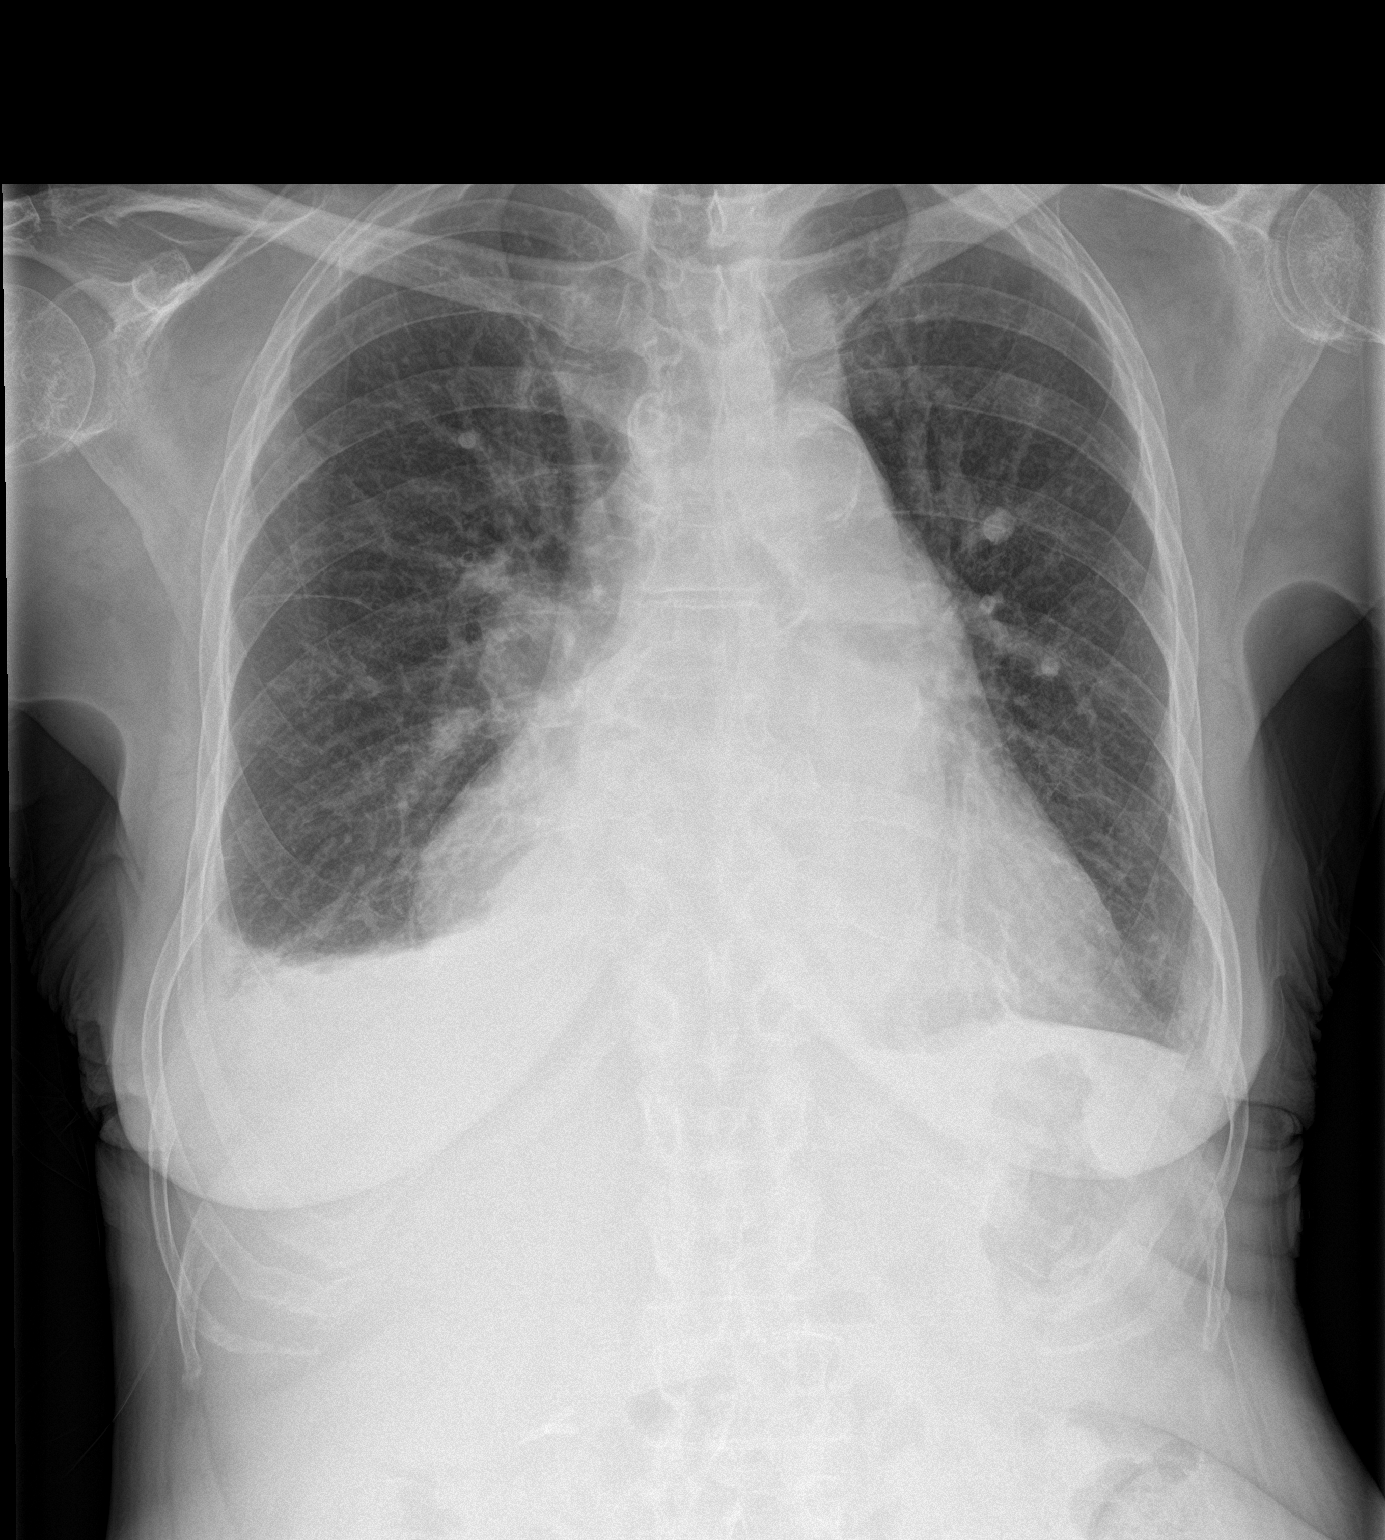

[chest lat]
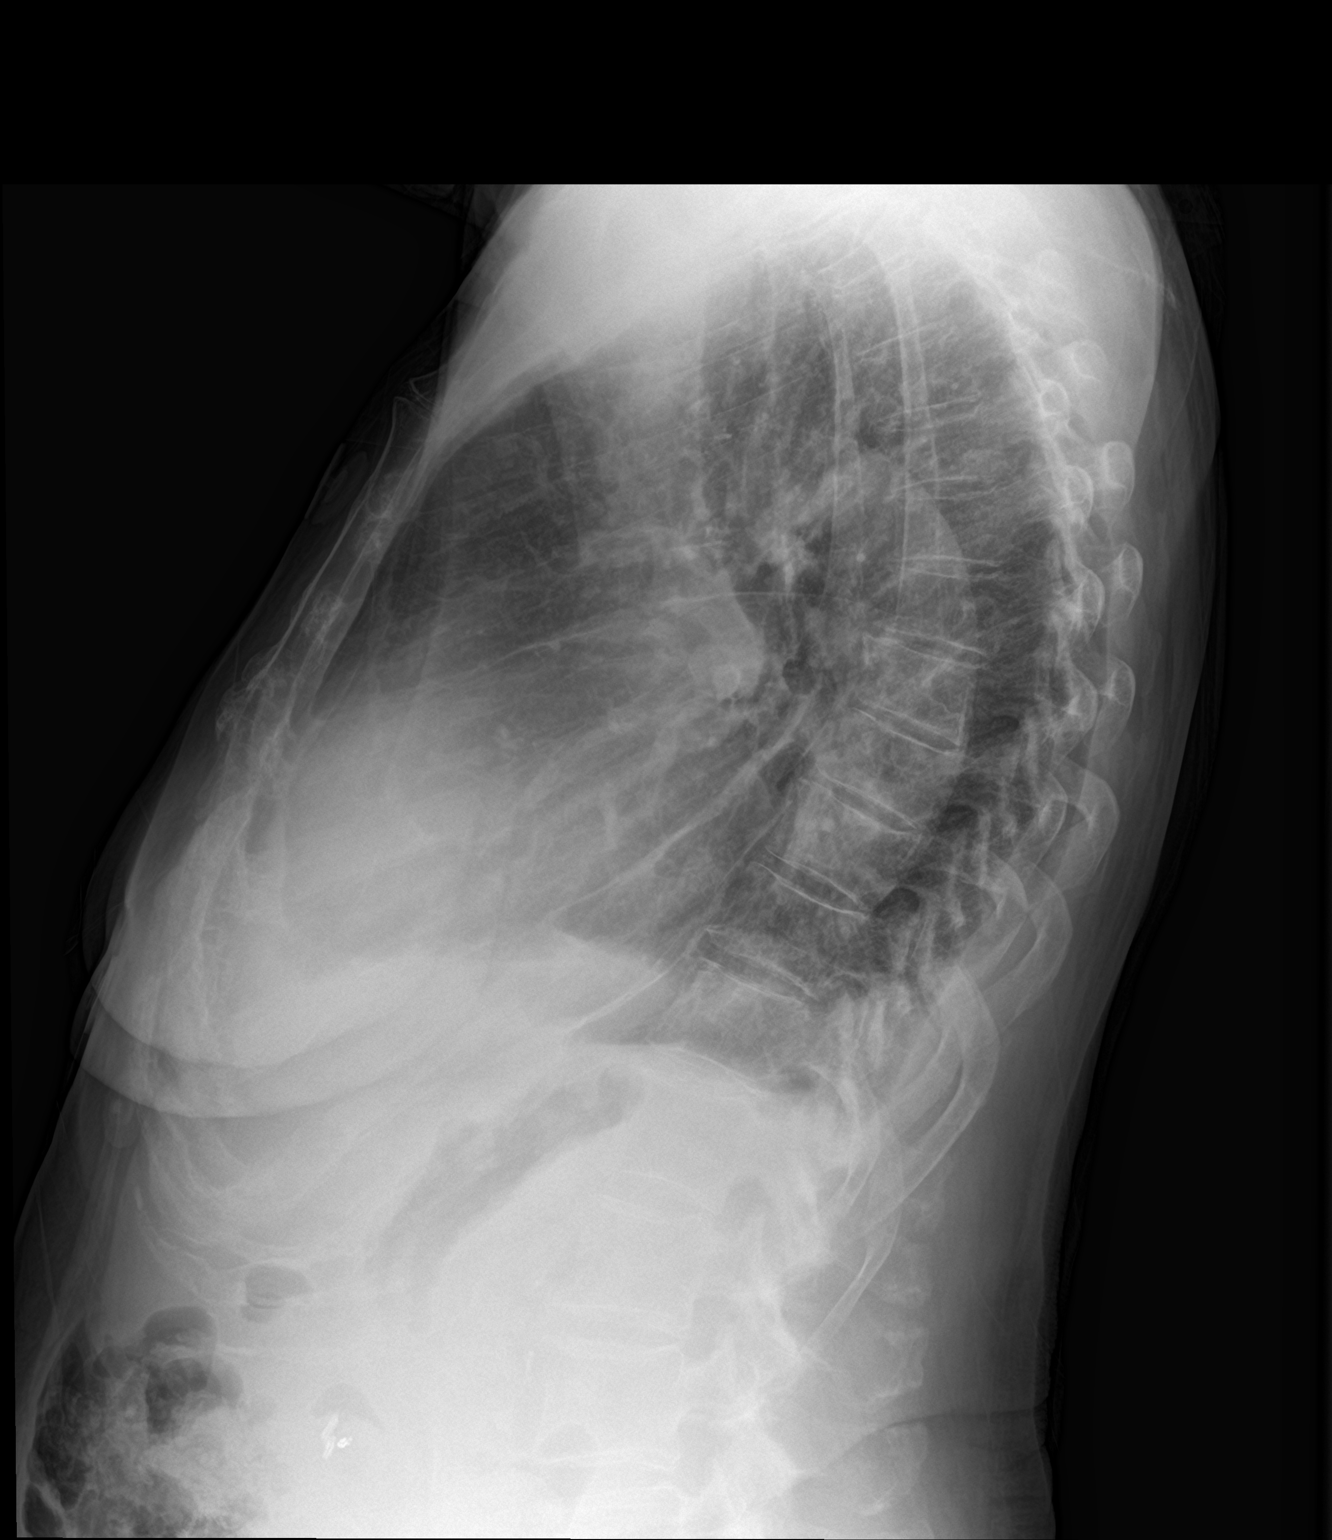

[2 of 2 positions shown; findings below may reference images not displayed]

FINDINGS: Stable cardiomediastinal silhouette with moderate cardiomegaly and
aortic atherosclerosis. No pneumothorax. Small right and trace left
pleural effusions. Mild pulmonary edema. Mild patchy bibasilar lung
opacities. Cholecystectomy clips are seen in the right upper
quadrant of the abdomen.
IMPRESSION: 1. Stable moderate cardiomegaly, with mild pulmonary edema,
consistent with mild congestive heart failure.
2. Small right and trace left pleural effusions .
3. Mild patchy bibasilar lung opacities, favor atelectasis.

## 2018-02-28 MED ORDER — LACTATED RINGERS IV SOLN
INTRAVENOUS | Status: AC
  Administered 2018-02-28: 11:00:00 via INTRAVENOUS

## 2018-02-28 NOTE — Progress Notes (Addendum)
PROGRESS NOTE  Nicole [name] QQP:[id number] DOB: 09/09/1930 DOA: 02/26/2018 PCP: [name], DO  Brief Narrative: 84 year old woman PMH chronic hypoxic respiratory failure on 3 L nasal cannula, diastolic CHF, atrial fibrillation/flutter not on anticoagulation secondary to bleeding risk, diabetes mellitus, dementia, presented to the emergency department with worsening shortness of breath.  Chest x-ray suggested pleural effusion and volume overload she was referred for further treatment and evaluation.  Assessment/Plan Acute on chronic hypoxic respiratory failure, on 3 L nasal cannula at home. Initial component including oxygen tube dysfunction.  Significant contributing factors right pleural effusion and acute on chronic diastolic CHF. --Appears now to be at baseline.  Acute on chronic diastolic CHF, last echo did not comment on diastolic function.  Also noted to have moderate tricuspid and pulmonic valve regurgitation.  Mild to moderate RV systolic dysfunction.  Followed by Dr. Debara Pickett, last seen 10/2017. Was discharged on lower dose of Lasix. --May be a bit dry.  Continue metoprolol.  Right greater than left pleural effusions.  Presumably related to CHF.  --Asymptomatic.  Repeat chest x-ray to my read shows mild increase in right pleural effusion compared to last study. --Pleural fluid culture pending but no signs or symptoms to suggest infection.  AKI --likely secondary to furosemide --Lasix on hold, creatinine w/o sig change. Will give small amount IVF and check creatinine in AM.  Chronic hypoxic respiratory failure on 3 L nasal cannula at home --Stable.  Continue oxygen supplementation.  Atrial fibrillation, flutter --Stable.  Continue Toprol-XL.  Not a candidate for anticoagulation secondary to falls.  Dementia  --Stable.  Continue Aricept.  Adult failure to thrive with ongoing weight loss.  Reportedly followed by palliative care in the outpatient setting.   Overall  appears better to the eye although renal function without significant improvement.  Output not quantified.  We will give a modest amount of fluids and recheck creatinine in a.m.  If stable would anticipate discharge home at that time.  Close outpatient follow-up with PCP, palliative medicine, if pleural fluid re-accumulates, may need to consider Pleurx catheter for comfort.  DVT prophylaxis: SCDs Code Status: DNR Family Communication: Left message per daughter Disposition Plan: HHPT    Murray Hodgkins, MD  [facility] Direct contact: [phone] --Via amion app OR  --[email or web address]; password [email or web address]  7PM-7AM contact night coverage as above 02/28/2018, 9:48 AM  LOS: 2 days   Consultants:    Procedures:  Right thoracentesis yielded 1.2 liters of serosanguineous fluid.  Antimicrobials:    Interval history/Subjective: Feels well.  Breathing fine.  No complaints.  Objective: Vitals:  Vitals:   02/27/18 2101 02/28/18 0452  BP: (!) 155/89 (!) 162/86  Pulse: 72 67  Resp: 18 18  Temp: (!) 97.5 F (36.4 C) (!) 97.5 F (36.4 C)  SpO2: 100% 100%    Exam: Constitutional:   . Appears calm and comfortable Respiratory:  . CTA bilaterally, no w/r/r.  . Respiratory effort normal. Cardiovascular:  . RRR, no m/r/g . No LE extremity edema   Psychiatric:  . Mental status o Mood, affect appropriate  I have personally reviewed the following:   Labs:  Blood sugars remain stable  Creatinine without significant change, 1.73.  BUN elevated at 31.  Pleural fluid culture pending  Scheduled Meds: . allopurinol  100 mg Oral Daily  . aspirin EC  81 mg Oral Daily  . donepezil  10 mg Oral QHS  . isosorbide mononitrate  30 mg Oral BID  . levothyroxine  25 mcg Oral QAC  breakfast  . metoprolol succinate  25 mg Oral Daily  . potassium chloride  10 mEq Oral Daily  . sertraline  50 mg Oral Daily  . sodium chloride flush  3 mL Intravenous Q12H  . sodium chloride flush  3 mL  Intravenous Q12H   Continuous Infusions: . sodium chloride      Principal Problem:   Acute on chronic respiratory failure with hypoxia (HCC) Active Problems:   Acute on chronic diastolic CHF (congestive heart failure) (HCC)   Pleural effusion on right   LOS: 2 days

## 2018-03-01 LAB — BASIC METABOLIC PANEL
ANION GAP: 8 (ref 5–15)
BUN: 30 mg/dL — AB (ref 8–23)
CALCIUM: 8 mg/dL — AB (ref 8.9–10.3)
CO2: 29 mmol/L (ref 22–32)
CREATININE: 1.46 mg/dL — AB (ref 0.44–1.00)
Chloride: 105 mmol/L (ref 98–111)
GFR calc Af Amer: 37 mL/min — ABNORMAL LOW (ref >60)
GFR calc non Af Amer: 32 mL/min — ABNORMAL LOW (ref >60)
GLUCOSE: 77 mg/dL (ref 70–99)
Potassium: 3.7 mmol/L (ref 3.5–5.1)
Sodium: 142 mmol/L (ref 135–145)

## 2018-03-01 NOTE — Progress Notes (Signed)
Patient resting comfortably during shift report. Denies complaints.  

## 2018-03-01 NOTE — Progress Notes (Signed)
Call placed to [facility] to notify of telemetry monitoring d/c.   

## 2018-03-01 NOTE — Progress Notes (Signed)
Pt finally wakes to eat breakfast. Will return with her medications now that she is more awake.

## 2018-03-01 NOTE — Progress Notes (Signed)
Pt continues to rest, pt wakened to eval neuro; appears at baseline. Allowed to rest.

## 2018-03-01 NOTE — Discharge Summary (Signed)
Physician Discharge Summary  Nicole [name]:[id number] DOB: [birth year] DOA: 02/26/2018  PCP: [name], DO  Admit date: 02/26/2018 Discharge date: 03/01/2018  Recommendations for Outpatient Follow-up:   Right greater than left pleural effusions.  --Presumably related to CHF.  Consider scheduled outpatient thoracentesis versus Pleurx catheter if requires a repeat procedures. --Fungal culture pending.  See discussion below for rationale.  Pathology findings below.  "Comment There are microorganisms present on select slides (one submitted tube only) consistent with fungus; correlation with microbiology results are recommended. The microorganisms are not seen on the cell block material."  Follow-up Information    [name], [name], DO. Schedule an appointment as soon as possible for a visit in 1 week(s).   Specialty:  Geriatric Medicine Contact information: [place]. [place] [address] [phone]        Pixie Casino, MD .   Specialty:  Cardiology Contact information: [address] [place] [address] [phone]            Discharge Diagnoses:  1. Acute on chronic hypoxic respiratory failure, on 3 L nasal cannula at home 2. Acute on chronic diastolic CHF 3. Right greater than left pleural effusions 4. AKI 5. Chronic hypoxic respiratory failure on 3 L nasal cannula at home 6. Atrial fibrillation, flutter 7. Dementia 8. Adult failure to thrive with ongoing weight loss.  Discharge Condition: improved Disposition: home  Diet recommendation: heart healthy  Filed Weights   02/27/18 0417 02/28/18 0452 03/01/18 0406  Weight: 53 kg 53.9 kg 57.3 kg    History of present illness:  84 year old woman PMH chronic hypoxic respiratory failure on 3 L nasal cannula, diastolic CHF, atrial fibrillation/flutter not on anticoagulation secondary to bleeding risk, diabetes mellitus, dementia, presented to the emergency department with  worsening shortness of breath. Chest x-ray suggested pleural effusion and volume overload she was referred for further treatment and evaluation.  Hospital Course:  Patient underwent therapeutic thoracentesis with rapid clinical improvement.  She did develop mild acute kidney injury secondary to overdiuresis which is now correcting with temporary withholding of diuretic.  Respiratory status appears stable and repeat chest x-ray demonstrated only small effusions.  Cytology from the pleural fluid was noted to have the presence of fungus, I discussed with pathologist Dr. Melina Copa, who was not certain as how to interpret this finding given that other specimens she received including the cell block did not have fungus present.  She suggested obtaining a fungal culture to further evaluate.  It is not clear the patient has any infection and clinically she does not appear to have an infection.  Fungal culture is pending and this has been communicated to PCP.  [name]  Acute on chronic hypoxic respiratory failure, on 3 L nasal cannula at home.  Multifactorial including malfunctioning oxygen tubing, large right pleural effusion, acute on chronic diastolic CHF.   --Resolved with therapeutic thoracentesis.  Acute on chronic diastolic CHF, last echo did not comment on diastolic function. Also noted to have moderate tricuspid and pulmonic valve regurgitation. Mild to moderate RV systolic dysfunction. Followed by Dr. Debara Pickett, last seen 10/2017. Was discharged on lower dose of Lasix. --Appears euvolemic.  Continue metoprolol.  Right greater than left pleural effusions. Presumably related to CHF.  --Asymptomatic.  Repeat chest x-ray showed small pleural effusions. --As noted above, cytology was abnormal, recommendation to obtain fungal culture has been followed.  AKI --  Secondary to overdiuresis,  resolving with Lasix on hold.  Chronic hypoxic respiratory failure on 3 L nasal cannula at home --Remains stable.    Continue oxygen supplementation.  Atrial fibrillation, flutter --Remains stable.   Continue Toprol-XL.  Not a candidate for anticoagulation secondary to falls.  Dementia --Remains stable.    Continue Aricept.  Adult failure to thrive with ongoing weight loss.Reportedly followed by palliative care in the outpatient setting.  Procedures:  Right thoracentesis yielded1.2liters of serosanguineousfluid.  Today's assessment: S: feels fine, no complaints. O: Vitals:  Vitals:   03/01/18 0406 03/01/18 0515  BP: (!) 168/101 (!) 157/92  Pulse: 73 (!) 59  Resp: 16   Temp: 98 F (36.7 C)   SpO2: 100%     Constitutional:  . Appears calm and comfortable Respiratory:  . CTA bilaterally, no w/r/r.  . Respiratory effort normal.  Cardiovascular:  . RRR, no m/r/g . No LE extremity edema   Psychiatric:  . Mental status o Mood, affect appropriate  Renal function improved, creatinine 1.46. Fungal culture pending  Discharge Instructions  Discharge Instructions    Activity as tolerated - No restrictions   Complete by:  As directed    Diet - low sodium heart healthy   Complete by:  As directed    Discharge instructions   Complete by:  As directed    Call your physician or seek immediate medical attention for shortness of breath, pain, swelling or worsening of condition.     Allergies as of 03/01/2018      Reactions   Iohexol Nausea And Vomiting   Iodinated Diagnostic Agents Nausea And Vomiting   Z-pak [azithromycin] Other (See Comments)   Reaction unknown by family      Medication List    TAKE these medications   acetaminophen 650 MG CR tablet Commonly known as:  TYLENOL Take 650 mg by mouth every 8 (eight) hours as needed for pain.   allopurinol 100 MG tablet Commonly known as:  ZYLOPRIM Take 1 tablet (100 mg total) by mouth daily.   aspirin  81 MG EC tablet Take 1 tablet (81 mg total) by mouth daily.   diltiazem 30 MG tablet Commonly known as:  CARDIZEM Take 1 tablet (30 mg total) by mouth 2 (two) times daily.   donepezil 10 MG tablet Commonly known as:  ARICEPT Take 1 tablet (10 mg total) by mouth at bedtime.   ferrous sulfate 325 (65 FE) MG EC tablet TAKE 1 TABLET (325 MG TOTAL) BY MOUTH 2 (TWO) TIMES DAILY WITH A MEAL.   furosemide 20 MG tablet Commonly known as:  LASIX Take 1 tablet (20 mg total) by mouth daily.   ipratropium 0.02 % nebulizer solution Commonly known as:  ATROVENT Take 2.5 mLs (0.5 mg total) by nebulization every 6 (six) hours as needed for wheezing or shortness of breath.   isosorbide mononitrate 30 MG 24 hr tablet Commonly known as:  IMDUR Take 1 tablet (30 mg total) by mouth 2 (two) times daily.   levothyroxine 25 MCG tablet Commonly known as:  SYNTHROID, LEVOTHROID Take 1 tablet (25 mcg total) by mouth daily before breakfast.   metoprolol succinate 25 MG 24 hr tablet Commonly known as:  TOPROL-XL Take 1 tablet (25 mg total) by mouth daily.   OXYGEN Inhale 2 L into the lungs continuous.   potassium chloride 10 MEQ tablet Commonly known as:  K-DUR Take 1 tablet (10 mEq total) by mouth daily.   sertraline 50 MG tablet Commonly known as:  ZOLOFT TAKE 1 TABLET BY MOUTH EVERY DAY What changed:    how much to take  how to take this  when to take this      Allergies  Allergen Reactions  . Iohexol Nausea And Vomiting  . Iodinated Diagnostic Agents Nausea And Vomiting  . Z-Pak [Azithromycin] Other (See Comments)    Reaction unknown by family    The results of significant diagnostics from this hospitalization (including imaging, microbiology, ancillary and laboratory) are listed below for reference.    Significant Diagnostic Studies: Dg Chest 1 View  Result Date: 02/26/2018 CLINICAL DATA:  Post RIGHT thoracentesis. EXAM: CHEST  1 VIEW 5:21 p.m.: COMPARISON:  Chest x-ray  earlier same day 8:25 a.m. and previously. FINDINGS: No pneumothorax after RIGHT thoracentesis. Small residual RIGHT pleural effusion, much improved since earlier today. Stable moderate-sized LEFT pleural effusion. Improved aeration in the RIGHT LOWER LOBE, with mild to moderate atelectasis persisting. Stable dense passive atelectasis involving the LEFT LOWER LOBE. Stable pulmonary venous hypertension without overt edema. Stable moderate to marked cardiomegaly. IMPRESSION: 1. No pneumothorax after RIGHT thoracentesis. 2. Small residual RIGHT pleural effusion after thoracentesis. 3. Improved aeration in the RIGHT LOWER LOBE with mild to moderate atelectasis persisting. 4. Stable moderate size LEFT pleural effusion and associated dense passive atelectasis involving the LEFT LOWER LOBE. Electronically Signed   By: Evangeline Dakin M.D.   On: 02/26/2018 17:30   Dg Chest 2 View  Result Date: 02/28/2018 CLINICAL DATA:  Follow-up pleural effusions EXAM: CHEST - 2 VIEW COMPARISON:  02/26/2018 FINDINGS: Cardiac shadow remains enlarged. Bilateral pleural effusions are again identified and stable. Mild central vascular congestion is noted. No pneumothorax is seen. IMPRESSION: Small bilateral pleural effusions. Mild vascular congestion is noted. Electronically Signed   By: Inez Catalina M.D.   On: 02/28/2018 11:55   Dg Chest Port 1 View  Result Date: 02/26/2018 CLINICAL DATA:  Shortness of breath. EXAM: PORTABLE CHEST 1 VIEW COMPARISON:  Radiograph February 12, 2018. FINDINGS: Stable cardiomegaly. Atherosclerosis of thoracic aorta is noted. No pneumothorax is noted. Increased bibasilar opacities are noted concerning for pleural effusions with associated atelectasis, right greater than left. Bony thorax is unremarkable. IMPRESSION: Increased bibasilar opacities are noted concerning for pleural effusions with associated atelectasis, right greater than left. Aortic Atherosclerosis (ICD10-I70.0). Electronically Signed   By: Marijo Conception, M.D.   On: 02/26/2018 08:54   Ir Thoracentesis Asp Pleural Space W/img Guide  Result Date: 02/27/2018 INDICATION: Progressive dyspnea. History of diastolic CHF, currently admitted to [facility] for exacerbation. Bilateral pleural effusions. Request for diagnostic and therapeutic thoracentesis. EXAM: ULTRASOUND GUIDED RIGHT THORACENTESIS MEDICATIONS: 15 mL 2% lidocaine. COMPLICATIONS: None immediate. PROCEDURE: An ultrasound guided thoracentesis was thoroughly discussed with the patient and questions answered. The benefits, risks, alternatives and complications were also discussed. The patient understands and wishes to proceed with the procedure. Written consent was obtained. Ultrasound was performed to localize and mark an adequate pocket of fluid in the right chest. The area was then prepped and draped in the normal sterile fashion. 2% Lidocaine was used for local anesthesia. Under ultrasound guidance a 6 Fr Safe-T-Centesis catheter was introduced. Thoracentesis was performed. The catheter was removed and a dressing applied. FINDINGS: A total of approximately 1.2L of serosanguineous fluid was removed. Samples were sent to the laboratory as requested by the clinical team. IMPRESSION: Successful ultrasound guided right thoracentesis yielding 1.2L of pleural fluid. Read by Candiss Norse, PA-C Electronically Signed   By: Markus Daft M.D.   On:  02/26/2018 17:33   Microbiology: Recent Results (from the past 240 hour(s))  Culture, body fluid-bottle     Status: None (Preliminary result)   Collection Time: 02/26/18  5:09 PM  Result Value Ref Range Status   Specimen Description PLEURAL RIGHT  Final   Special Requests NONE  Final   Culture   Final    NO GROWTH 3 DAYS Performed at [facility], [address], [place], [place] [address]    Report Status PENDING  Incomplete  Gram stain     Status: None   Collection Time: 02/26/18  5:09 PM  Result Value Ref Range Status   Specimen Description  PLEURAL RIGHT  Final   Special Requests NONE  Final   Gram Stain   Final    RARE WBC PRESENT, PREDOMINANTLY MONONUCLEAR NO ORGANISMS SEEN Performed at [facility], [place] [address], [place],  [address]    Report Status 02/26/2018 FINAL  Final     Labs: Basic Metabolic Panel: Recent Labs  Lab 02/26/18 0823 02/27/18 0435 02/28/18 0543 03/01/18 0541  NA 141 143 143 142  K 3.1* 3.4* 3.6 3.7  CL 102 104 105 105  CO2 29 31 27 29   GLUCOSE 90 95 71 77  BUN 9 21 31* 30*  CREATININE 1.08* 1.70* 1.73* 1.46*  CALCIUM 8.8* 8.7* 8.2* 8.0*   Liver Function Tests: Recent Labs  Lab 02/26/18 0823  AST 19  ALT 9  ALKPHOS 87  BILITOT 1.1  PROT 7.2  ALBUMIN 2.8*   CBC: Recent Labs  Lab 02/26/18 0823  WBC 7.3  NEUTROABS 4.1  HGB 10.7*  HCT 31.8*  MCV 80.3  PLT 334    Recent Labs    10/09/17 2020 02/09/18 1239 02/26/18 0823  BNP 892.5* 365.9* 969.5*    CBG: Recent Labs  Lab 02/27/18 0751 02/27/18 1120 02/27/18 1623 02/28/18 0739 02/28/18 1143  GLUCAP 99 86 111* 76 71    Principal Problem:   Acute on chronic respiratory failure with hypoxia (HCC) Active Problems:   Acute on chronic diastolic CHF (congestive heart failure) (HCC)   Pleural effusion on right   Time coordinating discharge: 35 minutes  Signed:  Murray Hodgkins, MD [facility] 03/01/2018, 12:15 PM

## 2018-03-02 IMAGING — DX DG CHEST 2V
2 series · 2 of 2 positions shown · non-contrast
Comparison: 04/09/2016

CLINICAL DATA: CHF

EXAM:
CHEST  2 VIEW

[x chest ap]
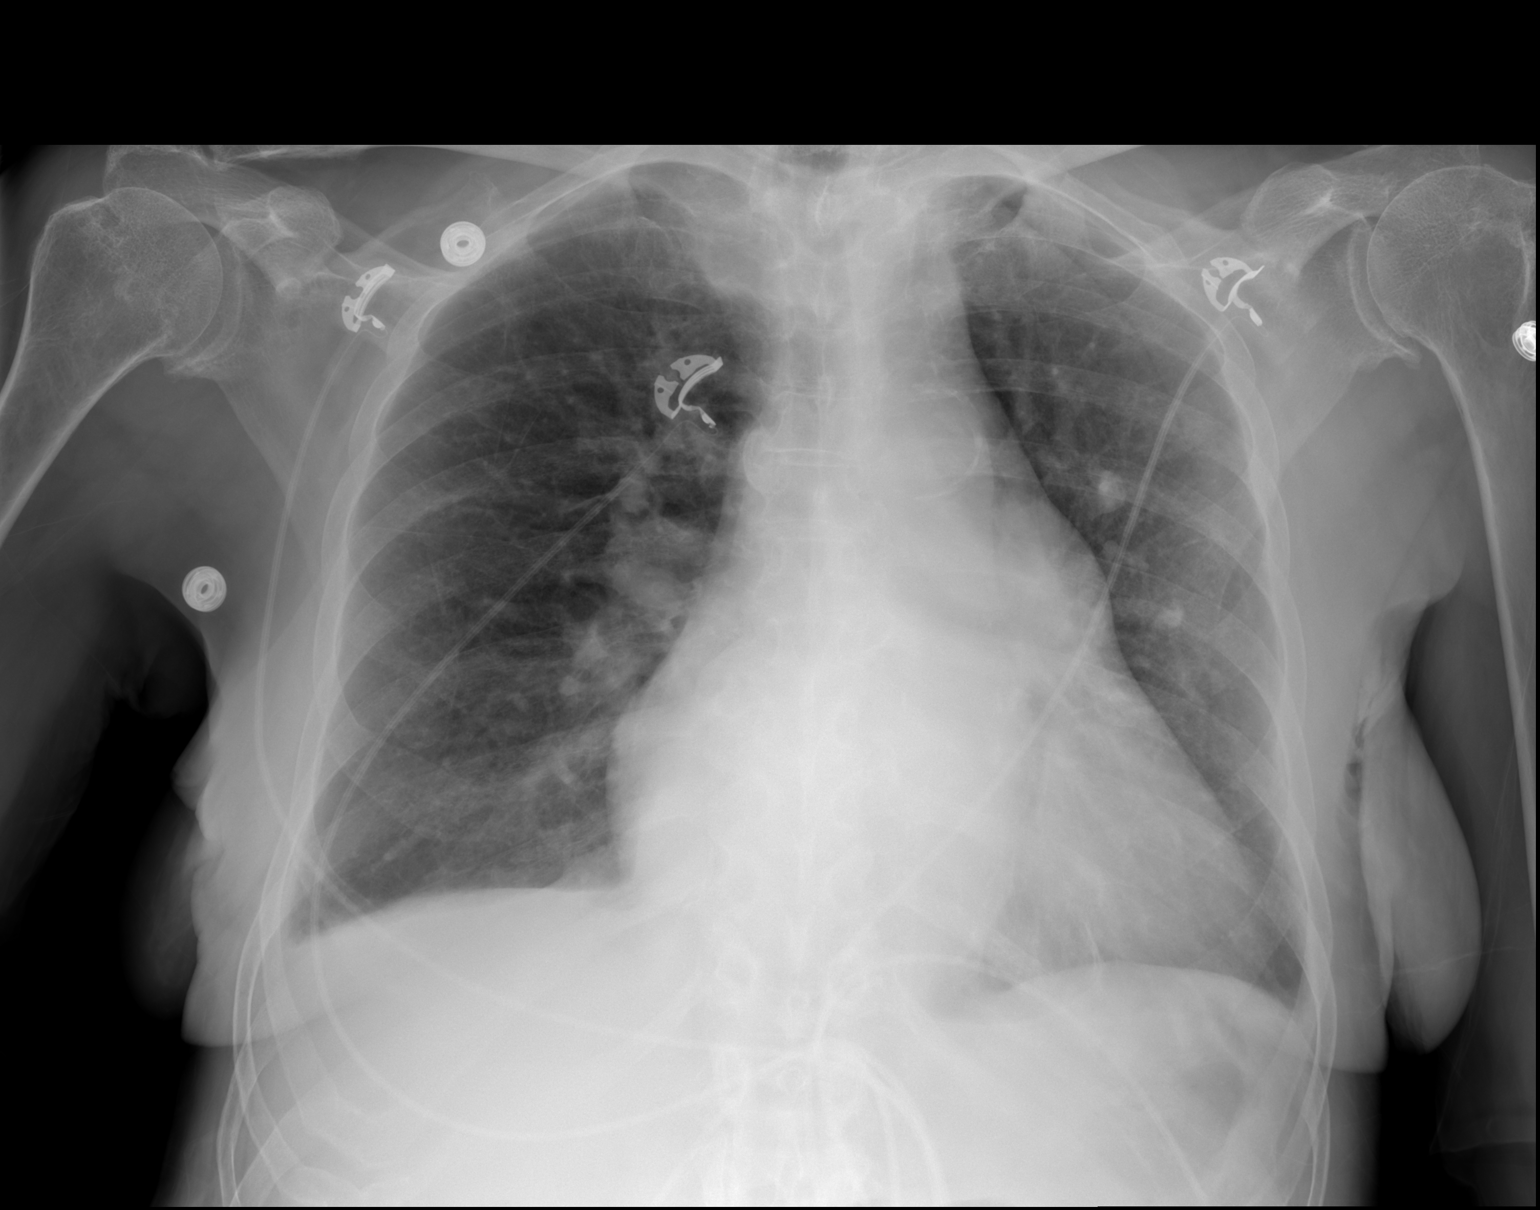

[w chest lat]
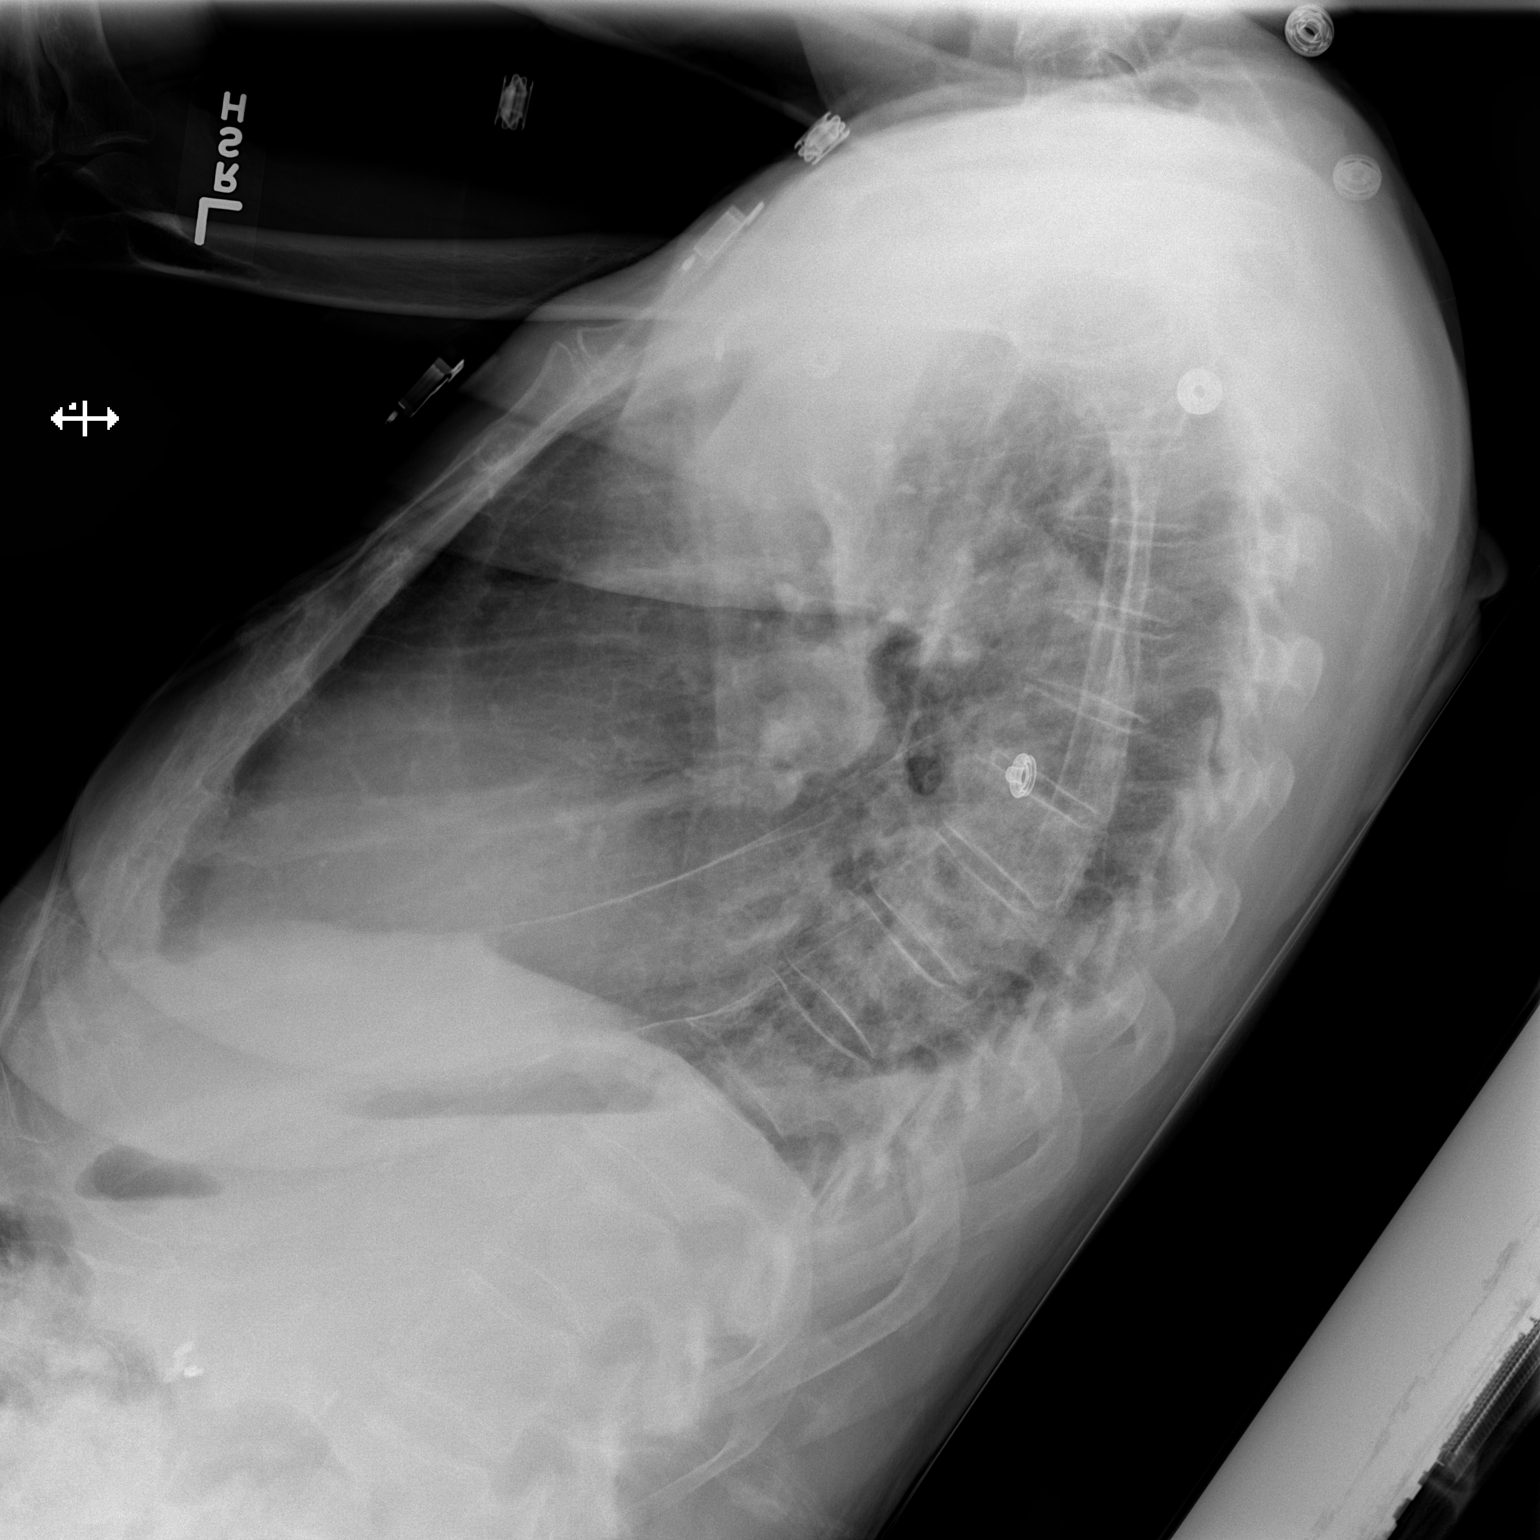

[2 of 2 positions shown; findings below may reference images not displayed]

FINDINGS: Cardiac enlargement with vascular congestion. No edema. Pulmonary
artery enlargement compatible with pulmonary artery hypertension is
unchanged. Small bilateral pleural effusions, with improvement of
the right pleural effusion. Negative for pneumonia.
IMPRESSION: Pulmonary vascular congestion with small pleural effusions
suggesting mild fluid overload. Negative for edema. Improvement in
right pleural effusion since prior study.

Pulmonary artery hypertension.

## 2018-03-03 ENCOUNTER — Ambulatory Visit (INDEPENDENT_AMBULATORY_CARE_PROVIDER_SITE_OTHER): Payer: Medicare HMO | Admitting: Internal Medicine

## 2018-03-03 ENCOUNTER — Telehealth: Payer: Self-pay

## 2018-03-03 ENCOUNTER — Encounter: Payer: Self-pay | Admitting: Internal Medicine

## 2018-03-03 VITALS — BP 160/80 | HR 66 | Temp 97.5°F | Ht 68.0 in | Wt 121.0 lb

## 2018-03-03 DIAGNOSIS — R627 Adult failure to thrive: Secondary | ICD-10-CM | POA: Diagnosis not present

## 2018-03-03 DIAGNOSIS — I5043 Acute on chronic combined systolic (congestive) and diastolic (congestive) heart failure: Secondary | ICD-10-CM | POA: Diagnosis not present

## 2018-03-03 DIAGNOSIS — F0150 Vascular dementia without behavioral disturbance: Secondary | ICD-10-CM | POA: Diagnosis not present

## 2018-03-03 DIAGNOSIS — R69 Illness, unspecified: Secondary | ICD-10-CM | POA: Diagnosis not present

## 2018-03-03 DIAGNOSIS — J90 Pleural effusion, not elsewhere classified: Secondary | ICD-10-CM

## 2018-03-03 LAB — CULTURE, BODY FLUID W GRAM STAIN -BOTTLE: Culture: NO GROWTH

## 2018-03-03 LAB — CULTURE, BODY FLUID-BOTTLE

## 2018-03-03 NOTE — Patient Instructions (Addendum)
If Nicole [name] becomes more short of breath suddenly, has diminished breath sounds at her right base, is requiring more oxygen to maintain her sats (than her baseline 3 liters), please contact our office right away so we can get a thoracentesis arranged outpatient to prevent another hospitalization.

## 2018-03-03 NOTE — Telephone Encounter (Signed)
I have made the 1st attempt to contact the patient or family member in charge, in order to follow up from recently being discharged from the hospital. I left a message on voicemail but I will make another attempt at a different time.  

## 2018-03-03 NOTE — Telephone Encounter (Signed)
Transition Care Management Follow-up Telephone Call  Date of discharge and from where: 03/01/2018 from [facility]  How have you been since you were released from the hospital? Per patient's daughter she was vomiting Saturday night when she got home but has been doing okay since.  Any questions or concerns? No   Items Reviewed:  Did the pt receive and understand the discharge instructions provided? Yes   Medications obtained and verified? Yes   Any new allergies since your discharge? No   Dietary orders reviewed? Yes  Do you have support at home? Yes , patient has a caregiver there every day  Other (ie: DME, Home Health, etc) No  Functional Questionnaire: (I = Independent and D = Dependent) ADL's: I with assist  Bathing/Dressing-I with assist   Meal Prep- D  Eating- I  Maintaining continence- I  Transferring/Ambulation- D  Managing Meds- I with assist   Follow up appointments reviewed:    PCP Hospital f/u appt confirmed? Yes  Scheduled to see Dr. Mariea Clonts on 03/03/2018 @ 3:30pm.  [facility] f/u appt confirmed? Yes  Are transportation arrangements needed? No   If their condition worsens, is the pt aware to call  their PCP or go to the ED? Yes  Was the patient provided with contact information for the PCP's office or ED? Yes  Was the pt encouraged to call back with questions or concerns? Yes

## 2018-03-03 NOTE — Progress Notes (Signed)
Location:  [facility] clinic Provider: Crissie Aloi L. Mariea Clonts, D.O., C.M.D.  Code Status: DNR Goals of Care:  Advanced Directives 03/03/2018  Does Patient Have a Medical Advance Directive? Yes  Type of Paramedic of [place];Living will;Out of facility DNR (pink MOST or yellow form)  Does patient want to make changes to medical advance directive? No - Patient declined  Copy of [place] in Chart? Yes  Would patient like information on creating a medical advance directive? No - Patient declined  Pre-existing out of facility DNR order (yellow form or pink MOST form) Yellow form placed in chart (order not valid for inpatient use)   Chief Complaint  Patient presents with  . Transitions Of Care    02/26/2018 - 03/01/2018 (3 days)    HPI: Patient is a 84 y.o. female with h/o longstanding vascular dementia, chronic mixed systolic and diastolic chf, chronic resp failure on 3L O2, hypothyroidism, AAA, chronic afib, CAD, htn, stage 3 pressure ulcer of the sacral region, failure to thrive seen today for hospital follow-up s/p admission from 8/7-8/10/19 with acute on chronic respiratory failure with hypoxia from acute chf exacerbation with right pleural effusion.    Fungal culture from thoracentesis negative thus far.   Echo showed moderate tricuspid regurg and pulmonary valve regurgitation with mild to moderate RV systolic dysfunction (per d/c summary).  She had acute kidney injury that resolved with lasix on hold.  She was falling and had an intracranial hemorrhage.    Breathing doing nicely.  Weight is down 4 more lbs since June.    Has been delusional about having a baby for 2 mos--says she had a lot of bleeding and the baby is gone.   This is caregiver's first day back with her.  She watched her, but able to do her own bath.  She's now aspirating some.  Had diarrhea on Saturday and vomited some.  Ok Sunday and today.  No longer nauseous.  Did eat ok yesterday and  today also.  Wound was coming and going on her bottom.  It's labeled as stage 3 pressure over sacrum late July, but I don't see any open skin at present at top of gluteal crease.  She says it hurts to sit on it.  Sits on couch, bed or at kitchen table mostly.  Home health RN came out last week to see her.    Past Medical History:  Diagnosis Date  . AAA (abdominal aortic aneurysm) ([place])   . Anemia   . Anxiety   . Arthritis   . Atrial fibrillation ([place])   . Cervicalgia   . Cholelithiasis   . Chronic systolic heart failure ([place])   . CKD (chronic kidney disease)   . Colon cancer ([place]) 07/1997  . Depression   . Diabetes mellitus ([place])   . Dizziness and giddiness   . Electrolyte and fluid disorders not elsewhere classified   . GERD (gastroesophageal reflux disease)   . Gout, unspecified   . Hemorrhage of rectum and anus   . Hypercholesteremia   . Hypertension   . Hypoglycemia, unspecified   . Hypopotassemia   . Hypothyroidism   . Kidney stone    renal calculi  . Other chronic allergic conjunctivitis   . Other specified cardiac dysrhythmias(427.89)   . Pain in joint, lower leg   . Perforation of bile duct   . Proteinuria   . Shortness of breath   . Small bowel obstruction due to adhesions ([place]) 05/16/2012  .  Stroke ([place])   . Swelling, mass, or lump in head and neck   . Thoracic aneurysm without mention of rupture    4.6 cm Asc Aortic aneurysm, CT 07/2015    Past Surgical History:  Procedure Laterality Date  . CARDIAC CATHETERIZATION  2008   moderate severe pulm HTN; with elevted pulm capillary wedge pressure   . CHOLECYSTECTOMY  05/14/2012   Procedure: LAPAROSCOPIC CHOLECYSTECTOMY WITH INTRAOPERATIVE CHOLANGIOGRAM;  Surgeon: Haywood Lasso, MD;  Location: [facility];  Service: General;  Laterality: N/A;  . ERCP  05/17/2012   Procedure: ENDOSCOPIC RETROGRADE CHOLANGIOPANCREATOGRAPHY (ERCP);  Surgeon: Missy Sabins, MD;  Location: [facility];  Service: Gastroenterology;  Laterality:  N/A;  . ERCP  08/12/2012   Procedure: ENDOSCOPIC RETROGRADE CHOLANGIOPANCREATOGRAPHY (ERCP);  Surgeon: Missy Sabins, MD;  Location[facility]COPY;  Service: Endoscopy;  Laterality: N/A;  . HEMICOLECTOMY  08/11/1997  . IR THORACENTESIS ASP PLEURAL SPACE W/IMG GUIDE  02/10/2018  . IR THORACENTESIS ASP PLEURAL SPACE W/IMG GUIDE  02/26/2018  . KIDNEY STONE SURGERY    . TRANSTHORACIC ECHOCARDIOGRAM  11/2009   EF 45-50%, mild-mod AV regurg; mild MR; mod TR; ascending aorta mildly dilated    Allergies  Allergen Reactions  . Iohexol Nausea And Vomiting  . Iodinated Diagnostic Agents Nausea And Vomiting  . Z-Pak [Azithromycin] Other (See Comments)    Reaction unknown by family    Outpatient Encounter Medications as of 03/03/2018  Medication Sig  . acetaminophen (TYLENOL) 650 MG CR tablet Take 650 mg by mouth every 8 (eight) hours as needed for pain.  Marland Kitchen allopurinol (ZYLOPRIM) 100 MG tablet Take 1 tablet (100 mg total) by mouth daily.  Marland Kitchen aspirin EC 81 MG EC tablet Take 1 tablet (81 mg total) by mouth daily.  Marland Kitchen diltiazem (CARDIZEM) 30 MG tablet Take 1 tablet (30 mg total) by mouth 2 (two) times daily.  Marland Kitchen donepezil (ARICEPT) 10 MG tablet Take 1 tablet (10 mg total) by mouth at bedtime.  . ferrous sulfate 325 (65 FE) MG EC tablet TAKE 1 TABLET (325 MG TOTAL) BY MOUTH 2 (TWO) TIMES DAILY WITH A MEAL.  . furosemide (LASIX) 20 MG tablet Take 1 tablet (20 mg total) by mouth daily.  Marland Kitchen ipratropium (ATROVENT) 0.02 % nebulizer solution Take 2.5 mLs (0.5 mg total) by nebulization every 6 (six) hours as needed for wheezing or shortness of breath.  . isosorbide mononitrate (IMDUR) 30 MG 24 hr tablet Take 1 tablet (30 mg total) by mouth 2 (two) times daily.  Marland Kitchen levothyroxine (SYNTHROID, LEVOTHROID) 25 MCG tablet Take 1 tablet (25 mcg total) by mouth daily before breakfast.  . metoprolol succinate (TOPROL-XL) 25 MG 24 hr tablet Take 1 tablet (25 mg total) by mouth daily.  . OXYGEN Inhale 2 L into the lungs continuous.    . potassium chloride (K-DUR) 10 MEQ tablet Take 1 tablet (10 mEq total) by mouth daily.  . sertraline (ZOLOFT) 50 MG tablet TAKE 1 TABLET BY MOUTH EVERY DAY (Patient taking differently: TAKE 1 TABLET (50 MG TOTALLY) BY MOUTH EVERY DAY)   No facility-administered encounter medications on file as of 03/03/2018.     Review of Systems:  Review of Systems  Constitutional: Negative for chills and fever.  HENT: Negative for congestion.   Eyes: Negative for blurred vision.  Respiratory: Negative for cough and shortness of breath.   Cardiovascular: Negative for chest pain, palpitations and leg swelling.  Gastrointestinal: Positive for nausea and vomiting. Negative for abdominal pain, blood in stool, constipation,  heartburn and melena.  Genitourinary: Negative for dysuria.  Musculoskeletal: Negative for falls.  Neurological: Negative for dizziness and loss of consciousness.  Endo/Heme/Allergies: Does not bruise/bleed easily.  Psychiatric/Behavioral: Positive for memory loss.       Delusional now about having baby and bleeding from her vagina    Health Maintenance  Topic Date Due  . FOOT EXAM  05/24/2017  . URINE MICROALBUMIN  07/30/2017  . MAMMOGRAM  10/18/2017  . INFLUENZA VACCINE  02/20/2018  . HEMOGLOBIN A1C  04/12/2018  . OPHTHALMOLOGY EXAM  07/18/2018  . TETANUS/TDAP  09/16/2021  . DEXA SCAN  Completed  . PNA vac Low Risk Adult  Completed    Physical Exam: Vitals:   03/03/18 1530  BP: (!) 160/80  Pulse: 66  Temp: (!) 97.5 F (36.4 C)  TempSrc: Oral  SpO2: 95%  Weight: 121 lb (54.9 kg)  Height: 5\' 8"  (1.727 m)   Body mass index is 18.4 kg/m. Physical Exam  Constitutional: No distress.  HENT:  Head: Normocephalic and atraumatic.  Cardiovascular: Intact distal pulses.  Murmur heard. irreg irreg  Pulmonary/Chest: Effort normal and breath sounds normal. She has no wheezes. She has no rales.  Breath sounds audible at both bases today, satting well on 3L O2 at 95%    Abdominal: Bowel sounds are normal.  Musculoskeletal: She exhibits no tenderness.  Neurological: She is alert.  delusional  Skin: Skin is warm and dry.  Psychiatric: She has a normal mood and affect.    Labs reviewed: Basic Metabolic Panel: Recent Labs    05/23/17 0517  07/25/17 1355  02/27/18 0435 02/28/18 0543 03/01/18 0541  NA  --    < >  --    < > 143 143 142  K  --    < >  --    < > 3.4* 3.6 3.7  CL  --    < >  --    < > 104 105 105  CO2  --    < >  --    < > 31 27 29   GLUCOSE  --    < >  --    < > 95 71 77  BUN  --    < >  --    < > 21 31* 30*  CREATININE  --    < >  --    < > 1.70* 1.73* 1.46*  CALCIUM  --    < >  --    < > 8.7* 8.2* 8.0*  TSH 0.240*  --  4.29  --   --   --   --    < > = values in this interval not displayed.   Liver Function Tests: Recent Labs    02/10/18 0824 02/26/18 0823  AST 16 19  ALT 7 9  ALKPHOS 65 87  BILITOT 0.7 1.1  PROT 6.6 7.2  ALBUMIN 2.7* 2.8*   No results for input(s): LIPASE, AMYLASE in the last 8760 hours. No results for input(s): AMMONIA in the last 8760 hours. CBC: Recent Labs    06/29/17 1204 10/09/17 2020  02/12/18 0447 02/13/18 0424 02/26/18 0823  WBC 5.9 5.4   < > 8.3 7.5 7.3  NEUTROABS 4.4 4.4  --   --   --  4.1  HGB 12.2 14.6   < > 10.6* 9.6* 10.7*  HCT 35.7* 42.1   < > 31.2* 28.2* 31.8*  MCV 76.0* 79.1   < > 80.6 80.3 80.3  PLT  218 155   < > 208 192 334   < > = values in this interval not displayed.   Lipid Panel: Recent Labs    10/10/17 0344  CHOL 173  HDL 37*  LDLCALC 119*  TRIG 87  CHOLHDL 4.7   Lab Results  Component Value Date   HGBA1C 5.5 10/10/2017    Procedures since last visit: Dg Chest 1 View  Result Date: 02/26/2018 CLINICAL DATA:  Post RIGHT thoracentesis. EXAM: CHEST  1 VIEW 5:21 p.m.: COMPARISON:  Chest x-ray earlier same day 8:25 a.m. and previously. FINDINGS: No pneumothorax after RIGHT thoracentesis. Small residual RIGHT pleural effusion, much improved since earlier today.  Stable moderate-sized LEFT pleural effusion. Improved aeration in the RIGHT LOWER LOBE, with mild to moderate atelectasis persisting. Stable dense passive atelectasis involving the LEFT LOWER LOBE. Stable pulmonary venous hypertension without overt edema. Stable moderate to marked cardiomegaly. IMPRESSION: 1. No pneumothorax after RIGHT thoracentesis. 2. Small residual RIGHT pleural effusion after thoracentesis. 3. Improved aeration in the RIGHT LOWER LOBE with mild to moderate atelectasis persisting. 4. Stable moderate size LEFT pleural effusion and associated dense passive atelectasis involving the LEFT LOWER LOBE. Electronically Signed   By: Evangeline Dakin M.D.   On: 02/26/2018 17:30   Dg Chest Port 1 View  Result Date: 02/26/2018 CLINICAL DATA:  Shortness of breath. EXAM: PORTABLE CHEST 1 VIEW COMPARISON:  Radiograph February 12, 2018. FINDINGS: Stable cardiomegaly. Atherosclerosis of thoracic aorta is noted. No pneumothorax is noted. Increased bibasilar opacities are noted concerning for pleural effusions with associated atelectasis, right greater than left. Bony thorax is unremarkable. IMPRESSION: Increased bibasilar opacities are noted concerning for pleural effusions with associated atelectasis, right greater than left. Aortic Atherosclerosis (ICD10-I70.0). Electronically Signed   By: Marijo Conception, M.D.   On: 02/26/2018 08:54   Ir Thoracentesis Asp Pleural Space W/img Guide  Result Date: 02/27/2018 INDICATION: Progressive dyspnea. History of diastolic CHF, currently admitted to [facility] for exacerbation. Bilateral pleural effusions. Request for diagnostic and therapeutic thoracentesis. EXAM: ULTRASOUND GUIDED RIGHT THORACENTESIS MEDICATIONS: 15 mL 2% lidocaine. COMPLICATIONS: None immediate. PROCEDURE: An ultrasound guided thoracentesis was thoroughly discussed with the patient and questions answered. The benefits, risks, alternatives and complications were also discussed. The patient understands and  wishes to proceed with the procedure. Written consent was obtained. Ultrasound was performed to localize and mark an adequate pocket of fluid in the right chest. The area was then prepped and draped in the normal sterile fashion. 2% Lidocaine was used for local anesthesia. Under ultrasound guidance a 6 Fr Safe-T-Centesis catheter was introduced. Thoracentesis was performed. The catheter was removed and a dressing applied. FINDINGS: A total of approximately 1.2L of serosanguineous fluid was removed. Samples were sent to the laboratory as requested by the clinical team. IMPRESSION: Successful ultrasound guided right thoracentesis yielding 1.2L of pleural fluid. Read by Candiss Norse, PA-C Electronically Signed   By: Markus Daft M.D.   On: 02/26/2018 17:33    Assessment/Plan 1. Acute on chronic combined systolic and diastolic heart failure (HCC) - no recurrent of acute chf evident on today's visit, weight down 3 more lbs  - Ambulatory referral to [facility]  2. Vascular dementia without behavioral disturbance -is now having delusions of being pregnant, having a baby and then bleeding -seems happy and content though despite this  3. Adult failure to thrive -continues to lose weight, but caregiver reports eating well the past two days  4. Pleural effusion, right - no fungal organisms identified; suspected to  be due to heart failure  - Ambulatory referral to [facility] for monitoring for effusion recurrence so thoracentesis can be arranged outpatient--parameters given on avs for when to call our office  Labs/tests ordered:   Orders Placed This Encounter  Procedures  . Ambulatory referral to Home Health    Referral Priority:   Routine    Referral Type:   Home Health Care    Referral Reason:   Specialty Services Required    Requested Specialty:   [place]    Number of Visits Requested:   1   Next appt:  03/27/2018--keep as scheduled  Skyelar Halliday L. Phineas Mcenroe,  D.O. [facility] [address] [place], [place] [address] Cell Phone (Mon-Fri 8am-5pm):  [phone] On Call:  [phone] & follow prompts after 5pm & weekends Office Phone:  [phone] Office Fax:  [phone]

## 2018-03-07 DIAGNOSIS — N189 Chronic kidney disease, unspecified: Secondary | ICD-10-CM | POA: Diagnosis not present

## 2018-03-07 DIAGNOSIS — E1122 Type 2 diabetes mellitus with diabetic chronic kidney disease: Secondary | ICD-10-CM | POA: Diagnosis not present

## 2018-03-07 DIAGNOSIS — I130 Hypertensive heart and chronic kidney disease with heart failure and stage 1 through stage 4 chronic kidney disease, or unspecified chronic kidney disease: Secondary | ICD-10-CM | POA: Diagnosis not present

## 2018-03-07 DIAGNOSIS — I4892 Unspecified atrial flutter: Secondary | ICD-10-CM | POA: Diagnosis not present

## 2018-03-07 DIAGNOSIS — I712 Thoracic aortic aneurysm, without rupture: Secondary | ICD-10-CM | POA: Diagnosis not present

## 2018-03-07 DIAGNOSIS — I482 Chronic atrial fibrillation: Secondary | ICD-10-CM | POA: Diagnosis not present

## 2018-03-07 DIAGNOSIS — I5043 Acute on chronic combined systolic (congestive) and diastolic (congestive) heart failure: Secondary | ICD-10-CM | POA: Diagnosis not present

## 2018-03-07 DIAGNOSIS — D631 Anemia in chronic kidney disease: Secondary | ICD-10-CM | POA: Diagnosis not present

## 2018-03-07 DIAGNOSIS — J9611 Chronic respiratory failure with hypoxia: Secondary | ICD-10-CM | POA: Diagnosis not present

## 2018-03-07 DIAGNOSIS — I2510 Atherosclerotic heart disease of native coronary artery without angina pectoris: Secondary | ICD-10-CM | POA: Diagnosis not present

## 2018-03-08 ENCOUNTER — Other Ambulatory Visit: Payer: Self-pay | Admitting: Nurse Practitioner

## 2018-03-17 ENCOUNTER — Other Ambulatory Visit: Payer: Self-pay

## 2018-03-17 ENCOUNTER — Inpatient Hospital Stay (HOSPITAL_COMMUNITY)
Admission: EM | Admit: 2018-03-17 | Discharge: 2018-03-26 | DRG: 291 | Disposition: A | Discharge status: Skilled Nursing Facility | Payer: Medicare HMO | Attending: Internal Medicine | Admitting: Internal Medicine

## 2018-03-17 ENCOUNTER — Encounter (HOSPITAL_COMMUNITY): Payer: Self-pay | Admitting: Internal Medicine

## 2018-03-17 ENCOUNTER — Emergency Department (HOSPITAL_COMMUNITY): Payer: Medicare HMO

## 2018-03-17 DIAGNOSIS — J9620 Acute and chronic respiratory failure, unspecified whether with hypoxia or hypercapnia: Secondary | ICD-10-CM | POA: Diagnosis present

## 2018-03-17 DIAGNOSIS — R0603 Acute respiratory distress: Secondary | ICD-10-CM | POA: Diagnosis not present

## 2018-03-17 DIAGNOSIS — I130 Hypertensive heart and chronic kidney disease with heart failure and stage 1 through stage 4 chronic kidney disease, or unspecified chronic kidney disease: Principal | ICD-10-CM | POA: Diagnosis present

## 2018-03-17 DIAGNOSIS — I482 Chronic atrial fibrillation: Secondary | ICD-10-CM | POA: Diagnosis present

## 2018-03-17 DIAGNOSIS — I619 Nontraumatic intracerebral hemorrhage, unspecified: Secondary | ICD-10-CM | POA: Diagnosis not present

## 2018-03-17 DIAGNOSIS — R2681 Unsteadiness on feet: Secondary | ICD-10-CM | POA: Diagnosis not present

## 2018-03-17 DIAGNOSIS — Z85038 Personal history of other malignant neoplasm of large intestine: Secondary | ICD-10-CM

## 2018-03-17 DIAGNOSIS — Z87442 Personal history of urinary calculi: Secondary | ICD-10-CM

## 2018-03-17 DIAGNOSIS — K219 Gastro-esophageal reflux disease without esophagitis: Secondary | ICD-10-CM | POA: Diagnosis present

## 2018-03-17 DIAGNOSIS — J811 Chronic pulmonary edema: Secondary | ICD-10-CM | POA: Diagnosis not present

## 2018-03-17 DIAGNOSIS — R2689 Other abnormalities of gait and mobility: Secondary | ICD-10-CM | POA: Diagnosis not present

## 2018-03-17 DIAGNOSIS — I639 Cerebral infarction, unspecified: Secondary | ICD-10-CM | POA: Diagnosis not present

## 2018-03-17 DIAGNOSIS — I509 Heart failure, unspecified: Secondary | ICD-10-CM | POA: Diagnosis not present

## 2018-03-17 DIAGNOSIS — E119 Type 2 diabetes mellitus without complications: Secondary | ICD-10-CM | POA: Diagnosis not present

## 2018-03-17 DIAGNOSIS — R402314 Coma scale, best motor response, none, 24 hours or more after hospital admission: Secondary | ICD-10-CM | POA: Diagnosis not present

## 2018-03-17 DIAGNOSIS — R296 Repeated falls: Secondary | ICD-10-CM | POA: Diagnosis present

## 2018-03-17 DIAGNOSIS — F419 Anxiety disorder, unspecified: Secondary | ICD-10-CM | POA: Diagnosis present

## 2018-03-17 DIAGNOSIS — D631 Anemia in chronic kidney disease: Secondary | ICD-10-CM | POA: Diagnosis present

## 2018-03-17 DIAGNOSIS — I10 Essential (primary) hypertension: Secondary | ICD-10-CM | POA: Diagnosis present

## 2018-03-17 DIAGNOSIS — J90 Pleural effusion, not elsewhere classified: Secondary | ICD-10-CM | POA: Diagnosis not present

## 2018-03-17 DIAGNOSIS — E1169 Type 2 diabetes mellitus with other specified complication: Secondary | ICD-10-CM | POA: Diagnosis not present

## 2018-03-17 DIAGNOSIS — I610 Nontraumatic intracerebral hemorrhage in hemisphere, subcortical: Secondary | ICD-10-CM

## 2018-03-17 DIAGNOSIS — I5043 Acute on chronic combined systolic (congestive) and diastolic (congestive) heart failure: Secondary | ICD-10-CM | POA: Diagnosis not present

## 2018-03-17 DIAGNOSIS — Z66 Do not resuscitate: Secondary | ICD-10-CM | POA: Diagnosis not present

## 2018-03-17 DIAGNOSIS — I2510 Atherosclerotic heart disease of native coronary artery without angina pectoris: Secondary | ICD-10-CM | POA: Diagnosis not present

## 2018-03-17 DIAGNOSIS — I629 Nontraumatic intracranial hemorrhage, unspecified: Secondary | ICD-10-CM | POA: Diagnosis not present

## 2018-03-17 DIAGNOSIS — M1990 Unspecified osteoarthritis, unspecified site: Secondary | ICD-10-CM | POA: Diagnosis present

## 2018-03-17 DIAGNOSIS — J9621 Acute and chronic respiratory failure with hypoxia: Secondary | ICD-10-CM | POA: Diagnosis present

## 2018-03-17 DIAGNOSIS — Z79890 Hormone replacement therapy (postmenopausal): Secondary | ICD-10-CM

## 2018-03-17 DIAGNOSIS — E876 Hypokalemia: Secondary | ICD-10-CM

## 2018-03-17 DIAGNOSIS — R69 Illness, unspecified: Secondary | ICD-10-CM | POA: Diagnosis not present

## 2018-03-17 DIAGNOSIS — R29713 NIHSS score 13: Secondary | ICD-10-CM | POA: Diagnosis not present

## 2018-03-17 DIAGNOSIS — I69191 Dysphagia following nontraumatic intracerebral hemorrhage: Secondary | ICD-10-CM | POA: Diagnosis not present

## 2018-03-17 DIAGNOSIS — R569 Unspecified convulsions: Secondary | ICD-10-CM

## 2018-03-17 DIAGNOSIS — E1122 Type 2 diabetes mellitus with diabetic chronic kidney disease: Secondary | ICD-10-CM | POA: Diagnosis present

## 2018-03-17 DIAGNOSIS — G4089 Other seizures: Secondary | ICD-10-CM | POA: Diagnosis not present

## 2018-03-17 DIAGNOSIS — R402114 Coma scale, eyes open, never, 24 hours or more after hospital admission: Secondary | ICD-10-CM | POA: Diagnosis not present

## 2018-03-17 DIAGNOSIS — F0390 Unspecified dementia without behavioral disturbance: Secondary | ICD-10-CM | POA: Diagnosis present

## 2018-03-17 DIAGNOSIS — Z7982 Long term (current) use of aspirin: Secondary | ICD-10-CM

## 2018-03-17 DIAGNOSIS — F0281 Dementia in other diseases classified elsewhere with behavioral disturbance: Secondary | ICD-10-CM | POA: Diagnosis not present

## 2018-03-17 DIAGNOSIS — J918 Pleural effusion in other conditions classified elsewhere: Secondary | ICD-10-CM | POA: Diagnosis present

## 2018-03-17 DIAGNOSIS — Z823 Family history of stroke: Secondary | ICD-10-CM

## 2018-03-17 DIAGNOSIS — Z833 Family history of diabetes mellitus: Secondary | ICD-10-CM

## 2018-03-17 DIAGNOSIS — R402214 Coma scale, best verbal response, none, 24 hours or more after hospital admission: Secondary | ICD-10-CM | POA: Diagnosis not present

## 2018-03-17 DIAGNOSIS — Z91041 Radiographic dye allergy status: Secondary | ICD-10-CM

## 2018-03-17 DIAGNOSIS — I498 Other specified cardiac arrhythmias: Secondary | ICD-10-CM | POA: Diagnosis present

## 2018-03-17 DIAGNOSIS — I5023 Acute on chronic systolic (congestive) heart failure: Secondary | ICD-10-CM | POA: Diagnosis not present

## 2018-03-17 DIAGNOSIS — F329 Major depressive disorder, single episode, unspecified: Secondary | ICD-10-CM | POA: Diagnosis present

## 2018-03-17 DIAGNOSIS — Z79899 Other long term (current) drug therapy: Secondary | ICD-10-CM

## 2018-03-17 DIAGNOSIS — E7800 Pure hypercholesterolemia, unspecified: Secondary | ICD-10-CM | POA: Diagnosis present

## 2018-03-17 DIAGNOSIS — E43 Unspecified severe protein-calorie malnutrition: Secondary | ICD-10-CM | POA: Diagnosis not present

## 2018-03-17 DIAGNOSIS — Z743 Need for continuous supervision: Secondary | ICD-10-CM | POA: Diagnosis not present

## 2018-03-17 DIAGNOSIS — N183 Chronic kidney disease, stage 3 (moderate): Secondary | ICD-10-CM | POA: Diagnosis not present

## 2018-03-17 DIAGNOSIS — E039 Hypothyroidism, unspecified: Secondary | ICD-10-CM | POA: Diagnosis present

## 2018-03-17 DIAGNOSIS — Z681 Body mass index (BMI) 19 or less, adult: Secondary | ICD-10-CM

## 2018-03-17 DIAGNOSIS — Z9981 Dependence on supplemental oxygen: Secondary | ICD-10-CM

## 2018-03-17 DIAGNOSIS — I340 Nonrheumatic mitral (valve) insufficiency: Secondary | ICD-10-CM | POA: Diagnosis not present

## 2018-03-17 DIAGNOSIS — E1151 Type 2 diabetes mellitus with diabetic peripheral angiopathy without gangrene: Secondary | ICD-10-CM | POA: Diagnosis present

## 2018-03-17 DIAGNOSIS — I69115 Cognitive social or emotional deficit following nontraumatic intracerebral hemorrhage: Secondary | ICD-10-CM | POA: Diagnosis not present

## 2018-03-17 DIAGNOSIS — Z8249 Family history of ischemic heart disease and other diseases of the circulatory system: Secondary | ICD-10-CM

## 2018-03-17 DIAGNOSIS — F02818 Dementia in other diseases classified elsewhere, unspecified severity, with other behavioral disturbance: Secondary | ICD-10-CM

## 2018-03-17 DIAGNOSIS — M6281 Muscle weakness (generalized): Secondary | ICD-10-CM | POA: Diagnosis not present

## 2018-03-17 DIAGNOSIS — M1A9XX0 Chronic gout, unspecified, without tophus (tophi): Secondary | ICD-10-CM | POA: Diagnosis present

## 2018-03-17 DIAGNOSIS — E785 Hyperlipidemia, unspecified: Secondary | ICD-10-CM | POA: Diagnosis present

## 2018-03-17 DIAGNOSIS — Z8673 Personal history of transient ischemic attack (TIA), and cerebral infarction without residual deficits: Secondary | ICD-10-CM

## 2018-03-17 DIAGNOSIS — I110 Hypertensive heart disease with heart failure: Secondary | ICD-10-CM | POA: Diagnosis not present

## 2018-03-17 DIAGNOSIS — Z888 Allergy status to other drugs, medicaments and biological substances status: Secondary | ICD-10-CM

## 2018-03-17 DIAGNOSIS — R278 Other lack of coordination: Secondary | ICD-10-CM | POA: Diagnosis not present

## 2018-03-17 DIAGNOSIS — D649 Anemia, unspecified: Secondary | ICD-10-CM | POA: Diagnosis present

## 2018-03-17 DIAGNOSIS — R279 Unspecified lack of coordination: Secondary | ICD-10-CM | POA: Diagnosis not present

## 2018-03-17 LAB — CBC WITH DIFFERENTIAL/PLATELET
ABS IMMATURE GRANULOCYTES: 0 10*3/uL (ref 0.0–0.1)
BASOS ABS: 0 10*3/uL (ref 0.0–0.1)
BASOS PCT: 1 %
Eosinophils Absolute: 0 10*3/uL (ref 0.0–0.7)
Eosinophils Relative: 1 %
HCT: 36.6 % (ref 36.0–46.0)
HEMOGLOBIN: 12.4 g/dL (ref 12.0–15.0)
Immature Granulocytes: 0 %
LYMPHS PCT: 18 %
Lymphs Abs: 1.1 10*3/uL (ref 0.7–4.0)
MCH: 27.7 pg (ref 26.0–34.0)
MCHC: 33.9 g/dL (ref 30.0–36.0)
MCV: 81.7 fL (ref 78.0–100.0)
MONO ABS: 0.3 10*3/uL (ref 0.1–1.0)
Monocytes Relative: 5 %
NEUTROS ABS: 4.8 10*3/uL (ref 1.7–7.7)
NEUTROS PCT: 75 %
Platelets: 219 10*3/uL (ref 150–400)
RBC: 4.48 MIL/uL (ref 3.87–5.11)
RDW: 15.2 % (ref 11.5–15.5)
WBC: 6.3 10*3/uL (ref 4.0–10.5)

## 2018-03-17 LAB — BASIC METABOLIC PANEL
ANION GAP: 9 (ref 5–15)
BUN: 10 mg/dL (ref 8–23)
CALCIUM: 9.5 mg/dL (ref 8.9–10.3)
CO2: 36 mmol/L — ABNORMAL HIGH (ref 22–32)
Chloride: 101 mmol/L (ref 98–111)
Creatinine, Ser: 1.12 mg/dL — ABNORMAL HIGH (ref 0.44–1.00)
GFR, EST AFRICAN AMERICAN: 51 mL/min — AB (ref >60)
GFR, EST NON AFRICAN AMERICAN: 44 mL/min — AB (ref >60)
Glucose, Bld: 134 mg/dL — ABNORMAL HIGH (ref 70–99)
POTASSIUM: 3.2 mmol/L — AB (ref 3.5–5.1)
SODIUM: 146 mmol/L — AB (ref 135–145)

## 2018-03-17 LAB — I-STAT TROPONIN, ED: TROPONIN I, POC: 0.03 ng/mL (ref 0.00–0.08)

## 2018-03-17 LAB — BRAIN NATRIURETIC PEPTIDE: B Natriuretic Peptide: 584 pg/mL — ABNORMAL HIGH (ref 0.0–100.0)

## 2018-03-17 LAB — MRSA PCR SCREENING: MRSA BY PCR: NEGATIVE

## 2018-03-17 MED ORDER — HYDRALAZINE HCL 20 MG/ML IJ SOLN
10.0000 mg | INTRAMUSCULAR | Status: DC | PRN
  Administered 2018-03-17 – 2018-03-18 (×2): 10 mg via INTRAVENOUS
  Filled 2018-03-17 (×3): qty 1

## 2018-03-17 MED ORDER — ACETAMINOPHEN 325 MG PO TABS
650.0000 mg | ORAL_TABLET | Freq: Four times a day (QID) | ORAL | Status: DC | PRN
  Administered 2018-03-18 (×2): 650 mg via ORAL
  Filled 2018-03-17 (×2): qty 2

## 2018-03-17 MED ORDER — ONDANSETRON HCL 4 MG/2ML IJ SOLN: 4.0000 mg | Freq: Four times a day (QID) | INTRAMUSCULAR | Status: DC | PRN

## 2018-03-17 MED ORDER — DONEPEZIL HCL 10 MG PO TABS
10.0000 mg | ORAL_TABLET | Freq: Every day | ORAL | Status: DC
  Administered 2018-03-17 – 2018-03-25 (×7): 10 mg via ORAL
  Filled 2018-03-17 (×8): qty 1

## 2018-03-17 MED ORDER — ASPIRIN EC 81 MG PO TBEC
81.0000 mg | DELAYED_RELEASE_TABLET | Freq: Every day | ORAL | Status: DC
  Administered 2018-03-18: 81 mg via ORAL
  Filled 2018-03-17: qty 1

## 2018-03-17 MED ORDER — ENOXAPARIN SODIUM 40 MG/0.4ML ~~LOC~~ SOLN: 40.0000 mg | SUBCUTANEOUS | Status: DC

## 2018-03-17 MED ORDER — HYDRALAZINE HCL 20 MG/ML IJ SOLN
5.0000 mg | INTRAMUSCULAR | Status: DC | PRN
  Administered 2018-03-17: 5 mg via INTRAVENOUS
  Filled 2018-03-17: qty 1

## 2018-03-17 MED ORDER — HYDRALAZINE HCL 20 MG/ML IJ SOLN
5.0000 mg | Freq: Once | INTRAMUSCULAR | Status: AC
  Administered 2018-03-17: 5 mg via INTRAVENOUS
  Filled 2018-03-17: qty 1

## 2018-03-17 MED ORDER — SODIUM CHLORIDE 0.9 % IV SOLN: 250.0000 mL | INTRAVENOUS | Status: DC | PRN

## 2018-03-17 MED ORDER — SENNOSIDES-DOCUSATE SODIUM 8.6-50 MG PO TABS: 1.0000 | ORAL_TABLET | Freq: Every evening | ORAL | Status: DC | PRN

## 2018-03-17 MED ORDER — ONDANSETRON HCL 4 MG PO TABS: 4.0000 mg | ORAL_TABLET | Freq: Four times a day (QID) | ORAL | Status: DC | PRN

## 2018-03-17 MED ORDER — ASPIRIN 81 MG PO TBEC: 81.0000 mg | DELAYED_RELEASE_TABLET | Freq: Every day | ORAL | Status: DC

## 2018-03-17 MED ORDER — ALBUTEROL SULFATE (2.5 MG/3ML) 0.083% IN NEBU
2.5000 mg | INHALATION_SOLUTION | RESPIRATORY_TRACT | Status: DC | PRN
  Administered 2018-03-17: 2.5 mg via RESPIRATORY_TRACT

## 2018-03-17 MED ORDER — DILTIAZEM HCL 30 MG PO TABS
30.0000 mg | ORAL_TABLET | Freq: Two times a day (BID) | ORAL | Status: DC
Start: 2018-03-17 — End: 2018-03-26
  Administered 2018-03-17 – 2018-03-26 (×15): 30 mg via ORAL
  Filled 2018-03-17 (×16): qty 1

## 2018-03-17 MED ORDER — ISOSORBIDE MONONITRATE ER 30 MG PO TB24
30.0000 mg | ORAL_TABLET | Freq: Two times a day (BID) | ORAL | Status: DC
  Administered 2018-03-17 – 2018-03-26 (×15): 30 mg via ORAL
  Filled 2018-03-17 (×17): qty 1

## 2018-03-17 MED ORDER — ACETAMINOPHEN 650 MG RE SUPP: 650.0000 mg | Freq: Four times a day (QID) | RECTAL | Status: DC | PRN

## 2018-03-17 MED ORDER — SERTRALINE HCL 50 MG PO TABS
50.0000 mg | ORAL_TABLET | Freq: Every day | ORAL | Status: DC
  Administered 2018-03-18 – 2018-03-26 (×8): 50 mg via ORAL
  Filled 2018-03-17 (×8): qty 1

## 2018-03-17 MED ORDER — METOPROLOL SUCCINATE ER 25 MG PO TB24
25.0000 mg | ORAL_TABLET | Freq: Every day | ORAL | Status: DC
  Administered 2018-03-17 – 2018-03-20 (×3): 25 mg via ORAL
  Filled 2018-03-17 (×4): qty 1

## 2018-03-17 MED ORDER — FUROSEMIDE 10 MG/ML IJ SOLN
40.0000 mg | Freq: Once | INTRAMUSCULAR | Status: AC
  Administered 2018-03-17: 40 mg via INTRAVENOUS
  Filled 2018-03-17: qty 4

## 2018-03-17 MED ORDER — FERROUS SULFATE 325 (65 FE) MG PO TABS
325.0000 mg | ORAL_TABLET | Freq: Two times a day (BID) | ORAL | Status: DC
  Administered 2018-03-17 – 2018-03-24 (×10): 325 mg via ORAL
  Filled 2018-03-17 (×10): qty 1

## 2018-03-17 MED ORDER — LEVOTHYROXINE SODIUM 25 MCG PO TABS
25.0000 ug | ORAL_TABLET | Freq: Every day | ORAL | Status: DC
  Administered 2018-03-18: 25 ug via ORAL
  Filled 2018-03-17 (×2): qty 1

## 2018-03-17 MED ORDER — ALBUTEROL SULFATE (2.5 MG/3ML) 0.083% IN NEBU
2.5000 mg | INHALATION_SOLUTION | Freq: Four times a day (QID) | RESPIRATORY_TRACT | Status: DC
  Filled 2018-03-17: qty 3

## 2018-03-17 MED ORDER — SODIUM CHLORIDE 0.9% FLUSH
3.0000 mL | Freq: Two times a day (BID) | INTRAVENOUS | Status: DC
  Administered 2018-03-17 – 2018-03-26 (×14): 3 mL via INTRAVENOUS

## 2018-03-17 MED ORDER — POTASSIUM CHLORIDE CRYS ER 10 MEQ PO TBCR
10.0000 meq | EXTENDED_RELEASE_TABLET | Freq: Every day | ORAL | Status: DC
  Administered 2018-03-18: 10 meq via ORAL
  Filled 2018-03-17: qty 1

## 2018-03-17 MED ORDER — POTASSIUM CHLORIDE CRYS ER 20 MEQ PO TBCR
40.0000 meq | EXTENDED_RELEASE_TABLET | Freq: Once | ORAL | Status: AC
  Administered 2018-03-17: 40 meq via ORAL
  Filled 2018-03-17: qty 2

## 2018-03-17 MED ORDER — HYDROCODONE-ACETAMINOPHEN 5-325 MG PO TABS
1.0000 | ORAL_TABLET | ORAL | Status: DC | PRN
  Administered 2018-03-17 – 2018-03-20 (×2): 1 via ORAL
  Filled 2018-03-17 (×2): qty 1

## 2018-03-17 MED ORDER — ALLOPURINOL 100 MG PO TABS
100.0000 mg | ORAL_TABLET | Freq: Every day | ORAL | Status: DC
  Administered 2018-03-17 – 2018-03-26 (×9): 100 mg via ORAL
  Filled 2018-03-17 (×9): qty 1

## 2018-03-17 MED ORDER — METOPROLOL SUCCINATE ER 25 MG PO TB24: 25.0000 mg | ORAL_TABLET | Freq: Every day | ORAL | Status: DC

## 2018-03-17 MED ORDER — ALBUTEROL SULFATE (2.5 MG/3ML) 0.083% IN NEBU
2.5000 mg | INHALATION_SOLUTION | Freq: Three times a day (TID) | RESPIRATORY_TRACT | Status: DC
  Administered 2018-03-18 (×3): 2.5 mg via RESPIRATORY_TRACT
  Filled 2018-03-17 (×3): qty 3

## 2018-03-17 MED ORDER — SODIUM CHLORIDE 0.9% FLUSH: 3.0000 mL | INTRAVENOUS | Status: DC | PRN

## 2018-03-17 NOTE — H&P (Addendum)
History and Physical    Nicole [name] OIB:[id number] DOB: 06-27-1931 DOA: 03/17/2018  PCP: [name], DO Patient coming from: home  Chief Complaint: sob  HPI: Nicole [name] is a 84 y.o. female with medical history significant for chronic hypoxic respiratory failure on 3 L nasal cannula at home, diastolic heart failure, atrial fibrillation/flutter not on anticoagulation secondary to bleeding risk, diabetes, dementia pleural effusion presents to emergency Department chief complaint worsening shortness of breath. Initial evaluation reveals acute respiratory distress likely related to recurrent bilateral pleural effusion in the setting of acute on chronic diastolic heart failure. [facility] are asked to admit.  Information is obtained from the patient and the chart noting that patient has a history of dementia so information may be unreliable.Marland Kitchen She was discharged 3 weeks ago after being admitted for same. She reports she was doing okay until 3 days ago she developed bilateral lower extremity swelling. She also developed worsening shortness of breath. She denies headache dizziness fever chills. She denies chest pain palpitations diaphoresis. She does endorse some nausea with one episode of emesis. She denies coffee ground emesis. She states she turned her oxygen up from 3 L to 4 L with little improvement.     ED Course: emergency department she's afebrile hypertensive tachypnea oxygen saturation level greater than 90% on 3 L. She is provided with IV Lasix.  Review of Systems: As per HPI otherwise all other systems reviewed and are negative.   Ambulatory Status: ambulates with a walker medium assist with ADLs  Past Medical History:  Diagnosis Date  . AAA (abdominal aortic aneurysm) ([place])   . Anemia   . Anxiety   . Arthritis   . Atrial fibrillation ([place])   . Cervicalgia   . Cholelithiasis   . Chronic systolic heart failure ([place])   . CKD (chronic kidney disease)   . Colon  cancer ([place]) 07/1997  . Depression   . Diabetes mellitus ([place])   . Dizziness and giddiness   . Electrolyte and fluid disorders not elsewhere classified   . GERD (gastroesophageal reflux disease)   . Gout, unspecified   . Hemorrhage of rectum and anus   . Hypercholesteremia   . Hypertension   . Hypoglycemia, unspecified   . Hypopotassemia   . Hypothyroidism   . Kidney stone    renal calculi  . Other chronic allergic conjunctivitis   . Other specified cardiac dysrhythmias(427.89)   . Pain in joint, lower leg   . Perforation of bile duct   . Proteinuria   . Shortness of breath   . Small bowel obstruction due to adhesions ([place]) 05/16/2012  . Stroke ([place])   . Swelling, mass, or lump in head and neck   . Thoracic aneurysm without mention of rupture    4.6 cm Asc Aortic aneurysm, CT 07/2015    Past Surgical History:  Procedure Laterality Date  . CARDIAC CATHETERIZATION  2008   moderate severe pulm HTN; with elevted pulm capillary wedge pressure   . CHOLECYSTECTOMY  05/14/2012   Procedure: LAPAROSCOPIC CHOLECYSTECTOMY WITH INTRAOPERATIVE CHOLANGIOGRAM;  Surgeon: Haywood Lasso, MD;  Location: [facility];  Service: General;  Laterality: N/A;  . ERCP  05/17/2012   Procedure: ENDOSCOPIC RETROGRADE CHOLANGIOPANCREATOGRAPHY (ERCP);  Surgeon: Missy Sabins, MD;  Location: [facility];  Service: Gastroenterology;  Laterality: N/A;  . ERCP  08/12/2012   Procedure: ENDOSCOPIC RETROGRADE CHOLANGIOPANCREATOGRAPHY (ERCP);  Surgeon: Missy Sabins, MD;  Location[facility] ENDOSCOPY;  Service: Endoscopy;  Laterality: N/A;  .  HEMICOLECTOMY  08/11/1997  . IR THORACENTESIS ASP PLEURAL SPACE W/IMG GUIDE  02/10/2018  . IR THORACENTESIS ASP PLEURAL SPACE W/IMG GUIDE  02/26/2018  . KIDNEY STONE SURGERY    . TRANSTHORACIC ECHOCARDIOGRAM  11/2009   EF 45-50%, mild-mod AV regurg; mild MR; mod TR; ascending aorta mildly dilated    Social History   Socioeconomic History  . Marital status: Widowed    Spouse name: Not on  file  . Number of children: Not on file  . Years of education: Not on file  . Highest education level: Not on file  Occupational History  . Not on file  Social Needs  . Financial resource strain: Not hard at all  . Food insecurity:    Worry: Never true    Inability: Never true  . Transportation needs:    Medical: No    Non-medical: No  Tobacco Use  . Smoking status: Never Smoker  . Smokeless tobacco: Never Used  Substance and Sexual Activity  . Alcohol use: No  . Drug use: No  . Sexual activity: Never  Lifestyle  . Physical activity:    Days per week: 0 days    Minutes per session: 0 min  . Stress: Not at all  Relationships  . Social connections:    Talks on phone: More than three times a week    Gets together: More than three times a week    Attends religious service: 1 to 4 times per year    Active member of club or organization: Yes    Attends meetings of clubs or organizations: Never    Relationship status: Widowed  . Intimate partner violence:    Fear of current or ex partner: No    Emotionally abused: No    Physically abused: No    Forced sexual activity: No  Other Topics Concern  . Not on file  Social History Narrative  . Not on file    Allergies  Allergen Reactions  . Iohexol Nausea And Vomiting  . Iodinated Diagnostic Agents Nausea And Vomiting  . Z-Pak [Azithromycin] Other (See Comments)    Reaction unknown by family    Family History  Problem Relation Age of Onset  . Heart disease Mother   . Stroke Father   . Hypertension Daughter   . Hypertension Son   . Diabetes Sister   . Hypertension Son     Prior to Admission medications   Medication Sig Start Date End Date Taking? Authorizing Provider  acetaminophen (TYLENOL) 650 MG CR tablet Take 650 mg by mouth every 8 (eight) hours as needed for pain.   Yes [provider]  allopurinol (ZYLOPRIM) 100 MG tablet TAKE 1 TABLET BY MOUTH EVERY DAY 03/10/18  Yes [name], [name], DO  aspirin EC  81 MG EC tablet Take 1 tablet (81 mg total) by mouth daily. 10/17/17  Yes Danford, Suann Larry, MD  diltiazem (CARDIZEM) 30 MG tablet Take 1 tablet (30 mg total) by mouth 2 (two) times daily. 02/15/18 02/15/19 Yes Georgette Shell, MD  donepezil (ARICEPT) 10 MG tablet Take 1 tablet (10 mg total) by mouth at bedtime. 10/16/17  Yes Danford, Suann Larry, MD  ferrous sulfate 325 (65 FE) MG EC tablet TAKE 1 TABLET (325 MG TOTAL) BY MOUTH 2 (TWO) TIMES DAILY WITH A MEAL. 01/06/18  Yes [name], [name], DO  furosemide (LASIX) 40 MG tablet Take 40 mg by mouth daily. 03/08/18  Yes [provider]  ipratropium (ATROVENT) 0.02 % nebulizer  solution Take 2.5 mLs (0.5 mg total) by nebulization every 6 (six) hours as needed for wheezing or shortness of breath. 11/21/17  Yes [name], [name], DO  isosorbide mononitrate (IMDUR) 30 MG 24 hr tablet TAKE 1 TABLET (30 MG TOTAL) BY MOUTH 2 (TWO) TIMES DAILY. 03/10/18  Yes [name], [name], DO  levothyroxine (SYNTHROID, LEVOTHROID) 25 MCG tablet Take 1 tablet (25 mcg total) by mouth daily before breakfast. 11/15/17  Yes Gildardo Cranker, DO  metoprolol succinate (TOPROL-XL) 25 MG 24 hr tablet Take 1 tablet (25 mg total) by mouth daily. 09/27/17  Yes [name], [name], DO  OXYGEN Inhale 3 L into the lungs continuous.   Yes [provider]  potassium chloride (K-DUR) 10 MEQ tablet Take 1 tablet (10 mEq total) by mouth daily. 02/15/18  Yes Georgette Shell, MD  sertraline (ZOLOFT) 50 MG tablet Take 50 mg by mouth daily.   Yes [provider]  furosemide (LASIX) 20 MG tablet Take 1 tablet (20 mg total) by mouth daily. Patient not taking: Reported on 03/17/2018 02/15/18 02/15/19  Georgette Shell, MD    Physical Exam: Vitals:   03/17/18 1700 03/17/18 1714 03/17/18 1715 03/17/18 1722  BP: (!) 206/140 (!) 163/102 (!) 181/102 (!) 149/109  Pulse: (!) 137 (!) 135 70 97  Resp: (!) 25 (!) 33 (!) 26 (!) 22  Temp:    98.3 F (36.8 C)  TempSrc:    Oral  SpO2:  96% 98% 98% 100%     General:  Appears slightly anxious only slightly uncomfortable thin frail Eyes:  PERRL, EOMI, normal lids, iris ENT:  grossly normal hearing, lips & tongue, mucous membranes of her mouth are pink but dry Neck:  no LAD, masses or thyromegaly Cardiovascular:  Irregularly irregular, no m/r/g. trace LE edema.  Respiratory:  Moderate increased work of breathing at rest. Respirations are shallow breath sounds are distant crackles noted diffusely no wheeze Abdomen:  soft, ntnd, NABS Skin:  no rash or induration seen on limited exam Musculoskeletal:  grossly normal tone BUE/BLE, good ROM, no bony abnormality Psychiatric:  grossly normal mood and affect, speech fluent and appropriate, AOx3 Neurologic:  Oriented to self and place speech clear facial symmetry able to follow simple commands able to make wants and needs known  Labs on Admission: I have personally reviewed following labs and imaging studies  CBC: Recent Labs  Lab 03/17/18 1421  WBC 6.3  NEUTROABS 4.8  HGB 12.4  HCT 36.6  MCV 81.7  PLT 992   Basic Metabolic Panel: Recent Labs  Lab 03/17/18 1421  NA 146*  K 3.2*  CL 101  CO2 36*  GLUCOSE 134*  BUN 10  CREATININE 1.12*  CALCIUM 9.5   GFR: CrCl cannot be calculated (Unknown ideal weight.). Liver Function Tests: No results for input(s): AST, ALT, ALKPHOS, BILITOT, PROT, ALBUMIN in the last 168 hours. No results for input(s): LIPASE, AMYLASE in the last 168 hours. No results for input(s): AMMONIA in the last 168 hours. Coagulation Profile: No results for input(s): INR, PROTIME in the last 168 hours. Cardiac Enzymes: No results for input(s): CKTOTAL, CKMB, CKMBINDEX, TROPONINI in the last 168 hours. BNP (last 3 results) No results for input(s): PROBNP in the last 8760 hours. HbA1C: No results for input(s): HGBA1C in the last 72 hours. CBG: No results for input(s): GLUCAP in the last 168 hours. Lipid Profile: No results for input(s): CHOL,  HDL, LDLCALC, TRIG, CHOLHDL, LDLDIRECT in the last 72 hours. Thyroid Function Tests:  No results for input(s): TSH, T4TOTAL, FREET4, T3FREE, THYROIDAB in the last 72 hours. Anemia Panel: No results for input(s): VITAMINB12, FOLATE, FERRITIN, TIBC, IRON, RETICCTPCT in the last 72 hours. Urine analysis:    Component Value Date/Time   COLORURINE YELLOW 02/14/2018 [place] 02/14/2018 1635   LABSPEC 1.010 02/14/2018 1635   PHURINE 5.0 02/14/2018 1635   GLUCOSEU NEGATIVE 02/14/2018 1635   HGBUR NEGATIVE 02/14/2018 1635   BILIRUBINUR NEGATIVE 02/14/2018 1635   KETONESUR NEGATIVE 02/14/2018 1635   PROTEINUR NEGATIVE 02/14/2018 1635   UROBILINOGEN 2.0 (H) 04/13/2015 1844   NITRITE NEGATIVE 02/14/2018 1635   LEUKOCYTESUR NEGATIVE 02/14/2018 1635    Creatinine Clearance: CrCl cannot be calculated (Unknown ideal weight.).  Sepsis Labs: @LABRCNTIP (procalcitonin:4,lacticidven:4) )No results found for this or any previous visit (from the past 240 hour(s)).   Radiological Exams on Admission: Dg Chest Portable 1 View  Result Date: 03/17/2018 CLINICAL DATA:  Acute dyspnea. EXAM: PORTABLE CHEST 1 VIEW COMPARISON:  Chest radiograph February 28, 2018 FINDINGS: Increasing cardiomegaly. Calcified aortic arch. Pulmonary vascular congestion diffuse interstitial prominence. Increased moderate RIGHT, stable small LEFT pleural effusions with underlying consolidation. Scattered calcified granulomas. No pneumothorax. Osteopenia. IMPRESSION: 1. Cardiomegaly with increasing pulmonary edema. 2. Increased moderate RIGHT and small LEFT pleural effusions. Underlying consolidation. 3.  Aortic Atherosclerosis (ICD10-I70.0). Electronically Signed   By: Elon Alas M.D.   On: 03/17/2018 14:58    EKG: Independently reviewed. Atrial fibrillation with rapid ventricular response with premature ventricular or aberrantly conducted complexes Incomplete right bundle branch block Cannot rule out Anterior  infarct , age undetermined ST & T wave abnormality, consider inferior ischemia Prolonged QT Abnormal ECG similar to Feb 26 2018   Assessment/Plan Principal Problem:   Acute respiratory distress Active Problems:   Acute on chronic systolic heart failure (HCC)   Pleural effusion   Anemia   Essential hypertension   Bradyarrhythmia   Diabetes mellitus ([place])   CAD (coronary artery disease)   DNR (do not resuscitate)   Anxiety   1. Acute respiratory distress likely related to recurrent pleural effusion in the setting of acute on chronic heart failure likely diastolic. Discharge 3 weeks ago after hospitalization for same. Increased oxygen demand. Patient is to Neck. Chest x-ray reveals cardiomegaly with increasing pulmonary edema and increased moderate right and small left pleural effusion underlying consolidation. BNP 584. Provided with 40 mg of Lasix IV in the emergency department and oxygen supplementation. At the time of admission oxygen saturation level greater than 90% at 2 L with mild increased work of breathing -Admit to telemetry -Continue IV Lasix -Request interventional radiology consult for evaluation of need for thoracentesis -Monitor oxygen saturation level  #2. Recurrent pleural effusion. Chest x-ray as noted above. Presumably related to CHF. Last hospitalization improved with IV diuresis. She also underwent image guided right thoracentesis yielding 1.2 L of fluid. Otology, aunt microorganisms consistent with fungus. Fungus culture still pending. Of note discharge summary indicates considering scheduled outpatient thoracentesis versus Pleurx catheter if requires repeat procedures. She is afebrile no leukocytosis nontoxic appearing -have requested IR consult for evaluation of thoracentesis -May need to consider scheduled thoracentesis versus Pleurx  #3.acute on chronic diastolic heart failure.echo done in March 2019 noted moderate tricuspid and pulmonic valve regurg, mild to  moderate RV systolic dysfunction. Discharge summary indicates she was discharged on a lower dose of Lasix. Medications include diltiazem, Lasix, Imdur, Toprol -Continue home meds -IV Lasix as noted above -Monitor intake and output -Obtain daily weights -Echo  #4.atrial  fibrillation/flutter.EKG as noted above. Home medications as noted above. Not a candidate for anticoagulation secondary to frequent falls -Continue home meds  #5. Hypertension. Poor control. -IV Lasix as noted above -resume home meds - prn. Hydralazine  #6. Diabetes. Non-insulin-dependent. Serum glucose 134 -Carb modified diet   DVT prophylaxis: lovenox Code Status: dnr  Family Communication: significant other  Disposition Plan: home  Consults called: IR for thoracentesis  Admission status: inpatient    Radene Gunning MD [facility]  If 7PM-7AM, please contact night-coverage [email or web address] Password [facility]  03/17/2018, 5:24 PM

## 2018-03-17 NOTE — Progress Notes (Signed)
ED RN called for clarification on the status of patient. Will patient be coming to the unit with documented blood pressures? RN indicates that she made provider aware, they are treating and there have been no orders to change bed status at this time. ED will notify inpatient unit as to when the patient will transferred to the unit.  Roosvelt Harps, MSN Surveyor, quantity of Nursing

## 2018-03-17 NOTE — ED Provider Notes (Signed)
Patient placed in Quick Look pathway, seen and evaluated   Chief Complaint: SOB  HPI:   84 y.o. who presents for evaluation of SOB that has been going on since yesterday. Patient reports she has had some difficulty breathing since yesterday. She is normally on 2 L O2 at home but state she has had to increase it to 3L. States she thinks she has a breathing issue but cannot recall her history. She has had some CP that began this morning. She received duoneb and solumedrol en route which she states improved her symptoms.   ROS: SOB   Physical Exam:   Gen: Speaking in short sentences   Neuro: Awake and Alert  Skin: Warm    Focused Exam:  Decreased breath sounds throughout.   Patient desat down in the 78% even with 2 L. Notified Charge RN that patient needs to be moved back immediately.    Initiation of care has begun. The patient has been counseled on the process, plan, and necessity for staying for the completion/evaluation, and the remainder of the medical screening examination      Volanda Napoleon, PA-C 03/17/18 1413    Tegeler, Gwenyth Allegra, MD 03/17/18 1623

## 2018-03-17 NOTE — ED Triage Notes (Addendum)
TC to 3 [place] RN reported because of Pt BP and HR can not take PT.

## 2018-03-17 NOTE — ED Triage Notes (Signed)
Black ,NP called to report BP and HR  Elevated  . New orders placed.

## 2018-03-17 NOTE — ED Notes (Signed)
Patient given turkey sandwich and PO fluids. Tolerating well.  

## 2018-03-17 NOTE — ED Notes (Signed)
Pt placed on bedpan

## 2018-03-17 NOTE — ED Triage Notes (Addendum)
Pt here with complaint of shortness of breath. On arrival SPO2 noted to be on 77% and home oxygen tank is empty. Pt placed on 4L via nasal cannula and improved to 84%.

## 2018-03-17 NOTE — ED Provider Notes (Signed)
[facility] EMERGENCY DEPARTMENT Provider Note   CSN: [id number] Arrival date & time: 03/17/18  1358     History   Chief Complaint Chief Complaint  Patient presents with  . Shortness of Breath    HPI Nicole [name] is a 84 y.o. female.  HPI  Patient presents with acute dyspnea.  This is a recurrent issue for her.  Started about 3 days ago along with bilateral leg swelling.  One leg is always a little bit worse than the other.  No cough, chest pain, chest tightness, fever.  She did vomit once today and has had nausea.  Think she vomited up her morning medicines.  Past Medical History:  Diagnosis Date  . AAA (abdominal aortic aneurysm) ([place])   . Anemia   . Anxiety   . Arthritis   . Atrial fibrillation ([place])   . Cervicalgia   . Cholelithiasis   . Chronic systolic heart failure ([place])   . CKD (chronic kidney disease)   . Colon cancer ([place]) 07/1997  . Depression   . Diabetes mellitus ([place])   . Dizziness and giddiness   . Electrolyte and fluid disorders not elsewhere classified   . GERD (gastroesophageal reflux disease)   . Gout, unspecified   . Hemorrhage of rectum and anus   . Hypercholesteremia   . Hypertension   . Hypoglycemia, unspecified   . Hypopotassemia   . Hypothyroidism   . Kidney stone    renal calculi  . Other chronic allergic conjunctivitis   . Other specified cardiac dysrhythmias(427.89)   . Pain in joint, lower leg   . Perforation of bile duct   . Proteinuria   . Shortness of breath   . Small bowel obstruction due to adhesions ([place]) 05/16/2012  . Stroke ([place])   . Swelling, mass, or lump in head and neck   . Thoracic aneurysm without mention of rupture    4.6 cm Asc Aortic aneurysm, CT 07/2015    Patient Active Problem List   Diagnosis Date Noted  . Acute on chronic diastolic CHF (congestive heart failure) ([place]) 02/26/2018  . Pleural effusion on right 02/26/2018  . Stage III pressure ulcer of sacral region ([place]) 02/15/2018    . Pleural effusion   . Acute on chronic respiratory failure with hypoxia ([place]) 02/11/2018  . Hypoxemia 02/09/2018  . Atrial flutter ([place]) 10/28/2017  . Pressure injury of skin 10/11/2017  . Hyperkalemia 10/10/2017  . Dementia 10/10/2017  . CAD (coronary artery disease) 10/10/2017  . Elevated troponin 10/10/2017  . Stroke ([place])   . Shortness of breath   . Proteinuria   . Perforation of bile duct   . Pain in joint, lower leg   . Other chronic allergic conjunctivitis   . Kidney stone   . Hypothyroidism   . Hypopotassemia   . Hypoglycemia, unspecified   . Hypertension   . Hypercholesteremia   . Hemorrhage of rectum and anus   . Gout, unspecified   . GERD (gastroesophageal reflux disease)   . Dizziness and giddiness   . Diabetes mellitus ([place])   . Depression   . CKD (chronic kidney disease)   . Chronic systolic heart failure ([place])   . Cervicalgia   . Arthritis   . Anxiety   . AAA (abdominal aortic aneurysm) ([place])   . Moderate protein-calorie malnutrition ([place]) 07/11/2017  . Major depression 05/28/2017  . Adult failure to thrive 05/28/2017  . Acute encephalopathy 05/23/2017  . Acute lower UTI 05/23/2017  .  Palliative care by specialist   . DNR (do not resuscitate)   . Bradyarrhythmia 01/11/2017  . Generalized weakness 01/11/2017  . General weakness 01/11/2017  . PE (pulmonary thromboembolism) ([place]) 08/24/2016  . Accelerated hypertension with diastolic congestive heart failure, NYHA class 3 ([place]) 01/05/2016  . Permanent atrial fibrillation ([place]) 01/05/2016  . Rib pain on right side 10/14/2015  . Hypothyroidism due to acquired atrophy of thyroid 10/14/2015  . Type 2 diabetes mellitus with diabetic nephropathy, without long-term current use of insulin ([place]) 10/14/2015  . Essential hypertension 10/14/2015  . Acute on chronic systolic heart failure ([place]) 08/02/2015  . Bradycardia 07/27/2015  . Chronic anemia 07/27/2015  . Chest pain   . Vascular dementia 06/13/2015  .  Memory loss 06/13/2015  . Left hemiparesis ([place])   . Chronic diastolic heart failure ([place])   . Type 2 diabetes mellitus with peripheral neuropathy (HCC)   . ICH (intracerebral hemorrhage) ([place]) 04/04/2015  . Atrial fibrillation, chronic ([place])   . Hypertensive urgency   . SOB (shortness of breath) 04/03/2014  . Diabetic nephropathy ([place]) 09/07/2013  . Chronic constipation 03/25/2013  . Carpal tunnel syndrome 02/25/2013  . Hypokalemia 05/17/2012  . Small bowel obstruction due to adhesions ([place]) 05/16/2012  . Normal coronary arteries, 2008 05/15/2012  . Type II or unspecified type diabetes mellitus with neurological manifestations, not stated as uncontrolled(250.60) 05/15/2012  . Acute respiratory distress 05/15/2012  . Abdominal pain, acute, right upper quadrant 05/12/2012  . Cholecystitis chronic, acute, S/P lap cholecystectomy 05/14/12 05/12/2012  . Thoracic aortic aneurysm without rupture ([place])   . CHF, past NICM with an EF of 30%, most recent echo 2011 showed EF to be45- 50%   . Anemia 08/09/2011  . History of colon cancer, stage III 08/09/2011  . Colon cancer ([place]) 07/23/1997    Past Surgical History:  Procedure Laterality Date  . CARDIAC CATHETERIZATION  2008   moderate severe pulm HTN; with elevted pulm capillary wedge pressure   . CHOLECYSTECTOMY  05/14/2012   Procedure: LAPAROSCOPIC CHOLECYSTECTOMY WITH INTRAOPERATIVE CHOLANGIOGRAM;  Surgeon: Haywood Lasso, MD;  Location: [facility];  Service: General;  Laterality: N/A;  . ERCP  05/17/2012   Procedure: ENDOSCOPIC RETROGRADE CHOLANGIOPANCREATOGRAPHY (ERCP);  Surgeon: Missy Sabins, MD;  Location: [facility];  Service: Gastroenterology;  Laterality: N/A;  . ERCP  08/12/2012   Procedure: ENDOSCOPIC RETROGRADE CHOLANGIOPANCREATOGRAPHY (ERCP);  Surgeon: Missy Sabins, MD;  Location: [facility] ENDOSCOPY;  Service: Endoscopy;  Laterality: N/A;  . HEMICOLECTOMY  08/11/1997  . IR THORACENTESIS ASP PLEURAL SPACE W/IMG GUIDE  02/10/2018  . IR  THORACENTESIS ASP PLEURAL SPACE W/IMG GUIDE  02/26/2018  . KIDNEY STONE SURGERY    . TRANSTHORACIC ECHOCARDIOGRAM  11/2009   EF 45-50%, mild-mod AV regurg; mild MR; mod TR; ascending aorta mildly dilated     OB History   None      Home Medications    Prior to Admission medications   Medication Sig Start Date End Date Taking? Authorizing Provider  acetaminophen (TYLENOL) 650 MG CR tablet Take 650 mg by mouth every 8 (eight) hours as needed for pain.   Yes [provider]  allopurinol (ZYLOPRIM) 100 MG tablet TAKE 1 TABLET BY MOUTH EVERY DAY 03/10/18  Yes [name], [name], DO  aspirin EC 81 MG EC tablet Take 1 tablet (81 mg total) by mouth daily. 10/17/17  Yes Danford, Suann Larry, MD  diltiazem (CARDIZEM) 30 MG tablet Take 1 tablet (30 mg total) by mouth 2 (two) times daily. 02/15/18  02/15/19 Yes Georgette Shell, MD  donepezil (ARICEPT) 10 MG tablet Take 1 tablet (10 mg total) by mouth at bedtime. 10/16/17  Yes Danford, Suann Larry, MD  ferrous sulfate 325 (65 FE) MG EC tablet TAKE 1 TABLET (325 MG TOTAL) BY MOUTH 2 (TWO) TIMES DAILY WITH A MEAL. 01/06/18  Yes [name], [name], DO  furosemide (LASIX) 40 MG tablet Take 40 mg by mouth daily. 03/08/18  Yes [provider]  ipratropium (ATROVENT) 0.02 % nebulizer solution Take 2.5 mLs (0.5 mg total) by nebulization every 6 (six) hours as needed for wheezing or shortness of breath. 11/21/17  Yes [name], [name], DO  isosorbide mononitrate (IMDUR) 30 MG 24 hr tablet TAKE 1 TABLET (30 MG TOTAL) BY MOUTH 2 (TWO) TIMES DAILY. 03/10/18  Yes [name], [name], DO  levothyroxine (SYNTHROID, LEVOTHROID) 25 MCG tablet Take 1 tablet (25 mcg total) by mouth daily before breakfast. 11/15/17  Yes Gildardo Cranker, DO  metoprolol succinate (TOPROL-XL) 25 MG 24 hr tablet Take 1 tablet (25 mg total) by mouth daily. 09/27/17  Yes [name], [name], DO  OXYGEN Inhale 3 L into the lungs continuous.   Yes [provider]  potassium chloride (K-DUR) 10  MEQ tablet Take 1 tablet (10 mEq total) by mouth daily. 02/15/18  Yes Georgette Shell, MD  sertraline (ZOLOFT) 50 MG tablet Take 50 mg by mouth daily.   Yes [provider]  furosemide (LASIX) 20 MG tablet Take 1 tablet (20 mg total) by mouth daily. Patient not taking: Reported on 03/17/2018 02/15/18 02/15/19  Georgette Shell, MD    Family History Family History  Problem Relation Age of Onset  . Heart disease Mother   . Stroke Father   . Hypertension Daughter   . Hypertension Son   . Diabetes Sister   . Hypertension Son     Social History Social History   Tobacco Use  . Smoking status: Never Smoker  . Smokeless tobacco: Never Used  Substance Use Topics  . Alcohol use: No  . Drug use: No     Allergies   Iohexol; Iodinated diagnostic agents; and Z-pak [azithromycin]   Review of Systems Review of Systems  Constitutional: Negative for fever.  Respiratory: Positive for shortness of breath. Negative for cough and chest tightness.   Cardiovascular: Positive for leg swelling. Negative for chest pain.  Gastrointestinal: Positive for nausea and vomiting. Negative for abdominal pain.  All other systems reviewed and are negative.    Physical Exam Updated Vital Signs BP (!) 178/140 (BP Location: Left Arm)   Pulse (!) 55   Resp (!) 33   SpO2 95%   Physical Exam  Constitutional: She is oriented to person, place, and time. She appears well-developed and well-nourished.  Non-toxic appearance. She does not appear ill. No distress.  HENT:  Head: Normocephalic and atraumatic.  Right Ear: External ear normal.  Left Ear: External ear normal.  Nose: Nose normal.  Eyes: Right eye exhibits no discharge. Left eye exhibits no discharge.  Cardiovascular: Normal heart sounds. An irregular rhythm present. Tachycardia present.  Pulmonary/Chest: No accessory muscle usage. Tachypnea noted. She has decreased breath sounds in the right lower field. She has rales in the left  lower field.  Abdominal: Soft. There is no tenderness.  Musculoskeletal:       Right lower leg: She exhibits edema (mild).       Left lower leg: She exhibits edema (mild).  Neurological: She is alert and oriented to person,  place, and time.  Skin: Skin is warm and dry.  Nursing note and vitals reviewed.    ED Treatments / Results  Labs (all labs ordered are listed, but only abnormal results are displayed) Labs Reviewed  BASIC METABOLIC PANEL - Abnormal; Notable for the following components:      Result Value   Sodium 146 (*)    Potassium 3.2 (*)    CO2 36 (*)    Glucose, Bld 134 (*)    Creatinine, Ser 1.12 (*)    GFR calc non Af Amer 44 (*)    GFR calc Af Amer 51 (*)    All other components within normal limits  BRAIN NATRIURETIC PEPTIDE - Abnormal; Notable for the following components:   B Natriuretic Peptide 584.0 (*)    All other components within normal limits  CBC WITH DIFFERENTIAL/PLATELET  I-STAT TROPONIN, ED    EKG EKG Interpretation  Date/Time:  Monday March 17 2018 14:07:19 EDT Ventricular Rate:  107 PR Interval:    QRS Duration: 102 QT Interval:  394 QTC Calculation: 525 R Axis:   43 Text Interpretation:  Atrial fibrillation with rapid ventricular response with premature ventricular or aberrantly conducted complexes Incomplete right bundle branch block Cannot rule out Anterior infarct , age undetermined ST & T wave abnormality, consider inferior ischemia Prolonged QT Abnormal ECG similar to Feb 26 2018 Confirmed by Sherwood Gambler [id number]) on 03/17/2018 2:18:35 PM   Radiology Dg Chest Portable 1 View  Result Date: 03/17/2018 CLINICAL DATA:  Acute dyspnea. EXAM: PORTABLE CHEST 1 VIEW COMPARISON:  Chest radiograph February 28, 2018 FINDINGS: Increasing cardiomegaly. Calcified aortic arch. Pulmonary vascular congestion diffuse interstitial prominence. Increased moderate RIGHT, stable small LEFT pleural effusions with underlying consolidation. Scattered calcified  granulomas. No pneumothorax. Osteopenia. IMPRESSION: 1. Cardiomegaly with increasing pulmonary edema. 2. Increased moderate RIGHT and small LEFT pleural effusions. Underlying consolidation. 3.  Aortic Atherosclerosis (ICD10-I70.0). Electronically Signed   By: Elon Alas M.D.   On: 03/17/2018 14:58    Procedures Procedures (including critical care time)  Medications Ordered in ED Medications  allopurinol (ZYLOPRIM) tablet 100 mg (has no administration in time range)  aspirin EC tablet 81 mg (has no administration in time range)  diltiazem (CARDIZEM) tablet 30 mg (has no administration in time range)  isosorbide mononitrate (IMDUR) 24 hr tablet 30 mg (has no administration in time range)  metoprolol succinate (TOPROL-XL) 24 hr tablet 25 mg (has no administration in time range)  donepezil (ARICEPT) tablet 10 mg (has no administration in time range)  sertraline (ZOLOFT) tablet 50 mg (has no administration in time range)  levothyroxine (SYNTHROID, LEVOTHROID) tablet 25 mcg (has no administration in time range)  ferrous sulfate EC tablet 325 mg (has no administration in time range)  potassium chloride (K-DUR) CR tablet 10 mEq (has no administration in time range)  enoxaparin (LOVENOX) injection 40 mg (has no administration in time range)  sodium chloride flush (NS) 0.9 % injection 3 mL (has no administration in time range)  sodium chloride flush (NS) 0.9 % injection 3 mL (has no administration in time range)  0.9 %  sodium chloride infusion (has no administration in time range)  acetaminophen (TYLENOL) tablet 650 mg (has no administration in time range)    Or  acetaminophen (TYLENOL) suppository 650 mg (has no administration in time range)  HYDROcodone-acetaminophen (NORCO/VICODIN) 5-325 MG per tablet 1-2 tablet (has no administration in time range)  senna-docusate (Senokot-S) tablet 1 tablet (has no administration in time range)  ondansetron (ZOFRAN)  tablet 4 mg (has no administration in  time range)    Or  ondansetron (ZOFRAN) injection 4 mg (has no administration in time range)  albuterol (PROVENTIL) (2.5 MG/3ML) 0.083% nebulizer solution 2.5 mg (has no administration in time range)  albuterol (PROVENTIL) (2.5 MG/3ML) 0.083% nebulizer solution 2.5 mg (has no administration in time range)  potassium chloride SA (K-DUR,KLOR-CON) CR tablet 40 mEq (40 mEq Oral Given 03/17/18 1600)  furosemide (LASIX) injection 40 mg (40 mg Intravenous Given 03/17/18 1558)     Initial Impression / Assessment and Plan / ED Course  I have reviewed the triage vital signs and the nursing notes.  Pertinent labs & imaging results that were available during my care of the patient were reviewed by me and considered in my medical decision making (see chart for details).     Work-up shows recurrent pleural effusions and pulmonary edema.  She will be given IV Lasix.  Also given oral potassium due to the hypokalemia and the need for diuresis which will probably lower her potassium.  Otherwise she does not have any anginal symptoms.  I doubt PE or pneumonia.  Admit to the hospitalist service.  Final Clinical Impressions(s) / ED Diagnoses   Final diagnoses:  Pleural effusion  Acute congestive heart failure, unspecified heart failure type [facility])  Hypokalemia    ED Discharge Orders    None       Sherwood Gambler, MD 03/17/18 [phone]

## 2018-03-17 NOTE — ED Notes (Signed)
Paged PA Schorr about bed placement.

## 2018-03-18 ENCOUNTER — Inpatient Hospital Stay (HOSPITAL_COMMUNITY): Payer: Medicare HMO

## 2018-03-18 DIAGNOSIS — I340 Nonrheumatic mitral (valve) insufficiency: Secondary | ICD-10-CM

## 2018-03-18 LAB — BASIC METABOLIC PANEL
ANION GAP: 10 (ref 5–15)
BUN: 12 mg/dL (ref 8–23)
CO2: 34 mmol/L — AB (ref 22–32)
Calcium: 9.1 mg/dL (ref 8.9–10.3)
Chloride: 101 mmol/L (ref 98–111)
Creatinine, Ser: 1 mg/dL (ref 0.44–1.00)
GFR calc Af Amer: 58 mL/min — ABNORMAL LOW (ref >60)
GFR calc non Af Amer: 50 mL/min — ABNORMAL LOW (ref >60)
GLUCOSE: 116 mg/dL — AB (ref 70–99)
Potassium: 3.2 mmol/L — ABNORMAL LOW (ref 3.5–5.1)
Sodium: 145 mmol/L (ref 135–145)

## 2018-03-18 LAB — CBC
HEMATOCRIT: 35.9 % — AB (ref 36.0–46.0)
HEMOGLOBIN: 12.2 g/dL (ref 12.0–15.0)
MCH: 26.9 pg (ref 26.0–34.0)
MCHC: 34 g/dL (ref 30.0–36.0)
MCV: 79.1 fL (ref 78.0–100.0)
Platelets: 219 10*3/uL (ref 150–400)
RBC: 4.54 MIL/uL (ref 3.87–5.11)
RDW: 15 % (ref 11.5–15.5)
WBC: 6.9 10*3/uL (ref 4.0–10.5)

## 2018-03-18 LAB — ECHOCARDIOGRAM COMPLETE
Height: 68 in
Weight: 1862.45 oz

## 2018-03-18 MED ORDER — FUROSEMIDE 10 MG/ML IJ SOLN
40.0000 mg | Freq: Every day | INTRAMUSCULAR | Status: DC
  Administered 2018-03-18: 40 mg via INTRAVENOUS
  Filled 2018-03-18: qty 4

## 2018-03-18 MED ORDER — POTASSIUM CHLORIDE CRYS ER 20 MEQ PO TBCR
40.0000 meq | EXTENDED_RELEASE_TABLET | Freq: Once | ORAL | Status: AC
  Administered 2018-03-18: 40 meq via ORAL
  Filled 2018-03-18: qty 2

## 2018-03-18 NOTE — Progress Notes (Signed)
  Echocardiogram 2D Echocardiogram has been performed.  Nicole [name] 03/18/2018, 2:32 PM

## 2018-03-18 NOTE — Progress Notes (Signed)
PROGRESS NOTE  Nicole [name]  KNL:[id number] DOB: 1930/10/09 DOA: 03/17/2018 PCP: [name], DO   Brief Narrative: Nicole [name] is an 84 y.o. female with a history of chronic hypoxic respiratory failure, chronic HFpEF, CVA, hypothyroidism, HTN, HLD, DM who presented with a few days of increasing leg swelling and shortness of breath causing her to increase the rate of oxygen at home. Work up in the ED revealed recurrent pleural effusions (R > L) and pulmonary edema as well as hypokalemia, elevated BNP, normal WBC. She was given potassium and IV lasix, admitted 8/26. Thoracentesis has been requested as well.   Assessment & Plan: Principal Problem:   Acute respiratory distress Active Problems:   Anemia   Acute on chronic systolic heart failure (HCC)   Essential hypertension   Bradyarrhythmia   DNR (do not resuscitate)   Diabetes mellitus ([place])   Anxiety   CAD (coronary artery disease)   Pleural effusion  Acute on chronic hypoxic respiratory failure: Most likely due to [facility] exacerbation with elevated BNP (584), negative troponin, pulmonary edema and worsening effusions along with peripheral edema.  - Repeat echo pending to evaluate diastolic parameters and LV/RV systolic function. - Thoracentesis.  - Diuresis, continue lasix 40mg  IV daily - Monitor I/O (no output recorded), weights, BMP.  - Continue supplemental oxygen prn, home "dose" is 2L.   Chronic atrial fibrillation/atrial flutter: Currently in AFlutter with controlled ventricular response - Continue BB, CCB. If not indication for infusion, could transfer to telemetry in next 24 hours. - Not on anticoagulation due to frequent falls  Hypokalemia: Mild.  - Continue supplementation po, especially with ongoing loop diuretic use  HTN: Coming under better control with home medications - Continue home metoprolol, imdur, diltiazem  NIDT2DM: Diet-controlled with last HbA1c 5.5%  - Carb-modified diet  Frequent falls:  -  PT/OT  Underweight: BMI 17.7.  - Dietitian consult  Gout: Chronic, no flare - Continue home allopurinol  Hypothyroidism:  - Continue synthroid  Dementia, depression:  - Continue aricept, SSRI  DVT prophylaxis: Lovenox Code Status: DNR Family Communication: None at bedside this AM Disposition Plan: Uncertain.  Consultants:   IR  Procedures:   Thoracentesis planned  Antimicrobials:  None   Subjective: Says she's breathing better but notes dyspnea worse than her baseline, trouble catching breath with any exertion. Swelling in legs is slightly improved but, again, not at baseline.   Objective: Vitals:   03/18/18 0741 03/18/18 0752 03/18/18 0900 03/18/18 1142  BP:  (!) 148/114  126/88  Pulse: (!) 101 (!) 105 100 84  Resp:  (!) 28 (!) 26 19  Temp: 97.9 F (36.6 C) (!) 97.4 F (36.3 C)  98.3 F (36.8 C)  TempSrc: Oral Oral  Oral  SpO2: 99% 99% 99%   Weight:      Height:        Intake/Output Summary (Last 24 hours) at 03/18/2018 1254 Last data filed at 03/17/2018 2200 Gross per 24 hour  Intake 240 ml  Output -  Net 240 ml   Filed Weights   03/17/18 2210  Weight: 52.8 kg    Gen: Thin, elderly female in no distress Pulm: Tachypneic with supplemental oxygen, diminished breath sounds R > L to midlung zones. + crackles. No wheezes.   CV: Rapid irregular with flutter waves variably conducting between 2:1, 3:1, 4:1. No murmur, rub, or gallop. + JVD, + pedal edema. GI: Abdomen soft, non-tender, non-distended, with normoactive bowel sounds. No organomegaly or masses felt. Ext: Warm,  no deformities Skin: No rashes, lesions no ulcers Neuro: Alert and oriented. No focal neurological deficits. Psych: Judgement and insight appear fair. Mood & affect appropriate.   Data Reviewed: I have personally reviewed following labs and imaging studies  CBC: Recent Labs  Lab 03/17/18 1421 03/18/18 0243  WBC 6.3 6.9  NEUTROABS 4.8  --   HGB 12.4 12.2  HCT 36.6 35.9*  MCV  81.7 79.1  PLT 219 662   Basic Metabolic Panel: Recent Labs  Lab 03/17/18 1421 03/18/18 0243  NA 146* 145  K 3.2* 3.2*  CL 101 101  CO2 36* 34*  GLUCOSE 134* 116*  BUN 10 12  CREATININE 1.12* 1.00  CALCIUM 9.5 9.1   GFR: Estimated Creatinine Clearance: 34.9 mL/min (by C-G formula based on SCr of 1 mg/dL). Liver Function Tests: No results for input(s): AST, ALT, ALKPHOS, BILITOT, PROT, ALBUMIN in the last 168 hours. No results for input(s): LIPASE, AMYLASE in the last 168 hours. No results for input(s): AMMONIA in the last 168 hours. Coagulation Profile: No results for input(s): INR, PROTIME in the last 168 hours. Cardiac Enzymes: No results for input(s): CKTOTAL, CKMB, CKMBINDEX, TROPONINI in the last 168 hours. BNP (last 3 results) No results for input(s): PROBNP in the last 8760 hours. HbA1C: No results for input(s): HGBA1C in the last 72 hours. CBG: No results for input(s): GLUCAP in the last 168 hours. Lipid Profile: No results for input(s): CHOL, HDL, LDLCALC, TRIG, CHOLHDL, LDLDIRECT in the last 72 hours. Thyroid Function Tests: No results for input(s): TSH, T4TOTAL, FREET4, T3FREE, THYROIDAB in the last 72 hours. Anemia Panel: No results for input(s): VITAMINB12, FOLATE, FERRITIN, TIBC, IRON, RETICCTPCT in the last 72 hours. Urine analysis:    Component Value Date/Time   COLORURINE YELLOW 02/14/2018 [place] 02/14/2018 1635   LABSPEC 1.010 02/14/2018 1635   PHURINE 5.0 02/14/2018 1635   GLUCOSEU NEGATIVE 02/14/2018 1635   HGBUR NEGATIVE 02/14/2018 1635   BILIRUBINUR NEGATIVE 02/14/2018 1635   KETONESUR NEGATIVE 02/14/2018 1635   PROTEINUR NEGATIVE 02/14/2018 1635   UROBILINOGEN 2.0 (H) 04/13/2015 1844   NITRITE NEGATIVE 02/14/2018 1635   LEUKOCYTESUR NEGATIVE 02/14/2018 1635   Recent Results (from the past 240 hour(s))  MRSA PCR Screening     Status: None   Collection Time: 03/17/18 10:12 PM  Result Value Ref Range Status   MRSA by  PCR NEGATIVE NEGATIVE Final    Comment:        The GeneXpert MRSA Assay (FDA approved for NASAL specimens only), is one component of a comprehensive MRSA colonization surveillance program. It is not intended to diagnose MRSA infection nor to guide or monitor treatment for MRSA infections. Performed at [facility], [place] [address], [place],  [address]       Radiology Studies: Dg Chest Portable 1 View  Result Date: 03/17/2018 CLINICAL DATA:  Acute dyspnea. EXAM: PORTABLE CHEST 1 VIEW COMPARISON:  Chest radiograph February 28, 2018 FINDINGS: Increasing cardiomegaly. Calcified aortic arch. Pulmonary vascular congestion diffuse interstitial prominence. Increased moderate RIGHT, stable small LEFT pleural effusions with underlying consolidation. Scattered calcified granulomas. No pneumothorax. Osteopenia. IMPRESSION: 1. Cardiomegaly with increasing pulmonary edema. 2. Increased moderate RIGHT and small LEFT pleural effusions. Underlying consolidation. 3.  Aortic Atherosclerosis (ICD10-I70.0). Electronically Signed   By: Elon Alas M.D.   On: 03/17/2018 14:58    Scheduled Meds: . albuterol  2.5 mg Nebulization TID  . allopurinol  100 mg Oral Daily  . aspirin EC  81 mg Oral Daily  . diltiazem  30 mg Oral BID  . donepezil  10 mg Oral QHS  . ferrous sulfate  325 mg Oral BID WC  . isosorbide mononitrate  30 mg Oral BID  . levothyroxine  25 mcg Oral QAC breakfast  . metoprolol succinate  25 mg Oral Daily  . potassium chloride  10 mEq Oral Daily  . sertraline  50 mg Oral Daily  . sodium chloride flush  3 mL Intravenous Q12H   Continuous Infusions: . sodium chloride       LOS: 1 day   Time spent: 25 minutes.  Patrecia Pour, MD [facility] [email or web address] Password [facility] 03/18/2018, 12:54 PM

## 2018-03-19 ENCOUNTER — Inpatient Hospital Stay (HOSPITAL_COMMUNITY): Payer: Medicare HMO

## 2018-03-19 ENCOUNTER — Encounter (HOSPITAL_COMMUNITY): Payer: Self-pay | Admitting: Physician Assistant

## 2018-03-19 DIAGNOSIS — I619 Nontraumatic intracerebral hemorrhage, unspecified: Secondary | ICD-10-CM

## 2018-03-19 DIAGNOSIS — E43 Unspecified severe protein-calorie malnutrition: Secondary | ICD-10-CM

## 2018-03-19 DIAGNOSIS — I610 Nontraumatic intracerebral hemorrhage in hemisphere, subcortical: Secondary | ICD-10-CM

## 2018-03-19 DIAGNOSIS — R0603 Acute respiratory distress: Secondary | ICD-10-CM

## 2018-03-19 DIAGNOSIS — I10 Essential (primary) hypertension: Secondary | ICD-10-CM

## 2018-03-19 DIAGNOSIS — R569 Unspecified convulsions: Secondary | ICD-10-CM

## 2018-03-19 DIAGNOSIS — E119 Type 2 diabetes mellitus without complications: Secondary | ICD-10-CM

## 2018-03-19 DIAGNOSIS — J90 Pleural effusion, not elsewhere classified: Secondary | ICD-10-CM

## 2018-03-19 DIAGNOSIS — I639 Cerebral infarction, unspecified: Secondary | ICD-10-CM

## 2018-03-19 HISTORY — PX: IR THORACENTESIS ASP PLEURAL SPACE W/IMG GUIDE: IMG5380

## 2018-03-19 LAB — HEMOGLOBIN A1C
Hgb A1c MFr Bld: 5.1 % (ref 4.8–5.6)
MEAN PLASMA GLUCOSE: 99.67 mg/dL

## 2018-03-19 LAB — COMPREHENSIVE METABOLIC PANEL
ALK PHOS: 80 U/L (ref 38–126)
ALT: 7 U/L (ref 0–44)
AST: 19 U/L (ref 15–41)
Albumin: 2.8 g/dL — ABNORMAL LOW (ref 3.5–5.0)
Anion gap: 10 (ref 5–15)
BUN: 13 mg/dL (ref 8–23)
CO2: 34 mmol/L — ABNORMAL HIGH (ref 22–32)
Calcium: 9.1 mg/dL (ref 8.9–10.3)
Chloride: 101 mmol/L (ref 98–111)
Creatinine, Ser: 1.52 mg/dL — ABNORMAL HIGH (ref 0.44–1.00)
GFR calc Af Amer: 35 mL/min — ABNORMAL LOW (ref >60)
GFR calc non Af Amer: 30 mL/min — ABNORMAL LOW (ref >60)
GLUCOSE: 103 mg/dL — AB (ref 70–99)
Potassium: 3.5 mmol/L (ref 3.5–5.1)
Sodium: 145 mmol/L (ref 135–145)
Total Bilirubin: 0.8 mg/dL (ref 0.3–1.2)
Total Protein: 6.7 g/dL (ref 6.5–8.1)

## 2018-03-19 LAB — BLOOD GAS, ARTERIAL
ACID-BASE EXCESS: 11 mmol/L — AB (ref 0.0–2.0)
Acid-Base Excess: 6.7 mmol/L — ABNORMAL HIGH (ref 0.0–2.0)
BICARBONATE: 36.5 mmol/L — AB (ref 20.0–28.0)
Bicarbonate: 32.6 mmol/L — ABNORMAL HIGH (ref 20.0–28.0)
DELIVERY SYSTEMS: POSITIVE
Drawn by: 330991
Drawn by: 330991
EXPIRATORY PAP: 8
FIO2: 40
Inspiratory PAP: 14
LHR: 12 {breaths}/min
O2 Content: 2 L/min
O2 Saturation: 94.4 %
O2 Saturation: 98.4 %
PH ART: 7.323 — AB (ref 7.350–7.450)
PH ART: 7.387 (ref 7.350–7.450)
Patient temperature: 98.6
Patient temperature: 97.8
pCO2 arterial: 64.7 mmHg — ABNORMAL HIGH (ref 32.0–48.0)
pCO2 arterial: 61.8 mmHg — ABNORMAL HIGH (ref 32.0–48.0)
pO2, Arterial: 80.1 mmHg — ABNORMAL LOW (ref 83.0–108.0)
pO2, Arterial: 121 mmHg — ABNORMAL HIGH (ref 83.0–108.0)

## 2018-03-19 LAB — LIPID PANEL
CHOL/HDL RATIO: 4.2 ratio
CHOLESTEROL: 224 mg/dL — AB (ref 0–200)
HDL: 53 mg/dL (ref >40)
LDL Cholesterol: 156 mg/dL — ABNORMAL HIGH (ref 0–99)
Triglycerides: 77 mg/dL (ref <150)
VLDL: 15 mg/dL (ref 0–40)

## 2018-03-19 LAB — TSH: TSH: 6.512 u[IU]/mL — ABNORMAL HIGH (ref 0.350–4.500)

## 2018-03-19 LAB — MAGNESIUM: Magnesium: 1.3 mg/dL — ABNORMAL LOW (ref 1.7–2.4)

## 2018-03-19 LAB — AMMONIA: Ammonia: 17 umol/L (ref 9–35)

## 2018-03-19 LAB — GLUCOSE, CAPILLARY
Glucose-Capillary: 109 mg/dL — ABNORMAL HIGH (ref 70–99)
Glucose-Capillary: 90 mg/dL (ref 70–99)

## 2018-03-19 MED ORDER — LORAZEPAM 2 MG/ML IJ SOLN: 1.0000 mg | INTRAMUSCULAR | Status: DC | PRN

## 2018-03-19 MED ORDER — LIDOCAINE HCL (PF) 2 % IJ SOLN
INTRAMUSCULAR | Status: DC | PRN
  Administered 2018-03-19: 10 mL

## 2018-03-19 MED ORDER — LEVOTHYROXINE SODIUM 50 MCG PO TABS
50.0000 ug | ORAL_TABLET | Freq: Every day | ORAL | Status: DC
  Administered 2018-03-20 – 2018-03-26 (×7): 50 ug via ORAL
  Filled 2018-03-19 (×8): qty 1

## 2018-03-19 MED ORDER — SODIUM CHLORIDE 0.9 % IV SOLN
150.0000 mg | Freq: Once | INTRAVENOUS | Status: AC
  Administered 2018-03-19: 150 mg via INTRAVENOUS
  Filled 2018-03-19: qty 15

## 2018-03-19 MED ORDER — SODIUM CHLORIDE 0.9 % IV SOLN
75.0000 mg | Freq: Two times a day (BID) | INTRAVENOUS | Status: DC
  Administered 2018-03-19 – 2018-03-23 (×8): 75 mg via INTRAVENOUS
  Filled 2018-03-19 (×8): qty 7.5

## 2018-03-19 MED ORDER — SENNOSIDES-DOCUSATE SODIUM 8.6-50 MG PO TABS
1.0000 | ORAL_TABLET | Freq: Two times a day (BID) | ORAL | Status: DC
  Administered 2018-03-20 – 2018-03-23 (×7): 1 via ORAL
  Filled 2018-03-19 (×7): qty 1

## 2018-03-19 MED ORDER — ALBUTEROL SULFATE (2.5 MG/3ML) 0.083% IN NEBU: 2.5000 mg | INHALATION_SOLUTION | RESPIRATORY_TRACT | Status: DC | PRN

## 2018-03-19 MED ORDER — ACETAMINOPHEN 325 MG PO TABS: 650.0000 mg | ORAL_TABLET | ORAL | Status: DC | PRN

## 2018-03-19 MED ORDER — HYDRALAZINE HCL 20 MG/ML IJ SOLN
10.0000 mg | INTRAMUSCULAR | Status: DC | PRN
  Administered 2018-03-19 – 2018-03-22 (×3): 10 mg via INTRAVENOUS
  Filled 2018-03-19 (×3): qty 1

## 2018-03-19 MED ORDER — ACETAMINOPHEN 160 MG/5ML PO SOLN: 650.0000 mg | ORAL | Status: DC | PRN

## 2018-03-19 MED ORDER — STROKE: EARLY STAGES OF RECOVERY BOOK
Freq: Once | Status: AC
  Administered 2018-03-19: 06:00:00
  Filled 2018-03-19: qty 1

## 2018-03-19 MED ORDER — MAGNESIUM SULFATE 4 GM/100ML IV SOLN
4.0000 g | Freq: Once | INTRAVENOUS | Status: AC
  Administered 2018-03-19: 4 g via INTRAVENOUS
  Filled 2018-03-19: qty 100

## 2018-03-19 MED ORDER — LIDOCAINE HCL (PF) 2 % IJ SOLN
INTRAMUSCULAR | Status: AC
  Administered 2018-03-19: 13:00:00
  Filled 2018-03-19: qty 20

## 2018-03-19 MED ORDER — FAMOTIDINE IN NACL 20-0.9 MG/50ML-% IV SOLN
20.0000 mg | INTRAVENOUS | Status: DC
  Administered 2018-03-19 – 2018-03-20 (×2): 20 mg via INTRAVENOUS
  Filled 2018-03-19 (×2): qty 50

## 2018-03-19 MED ORDER — METOPROLOL TARTRATE 5 MG/5ML IV SOLN
5.0000 mg | INTRAVENOUS | Status: DC | PRN
  Administered 2018-03-19: 5 mg via INTRAVENOUS
  Filled 2018-03-19: qty 5

## 2018-03-19 MED ORDER — SODIUM CHLORIDE 0.9 % IV SOLN: 200.0000 mg | Freq: Once | INTRAVENOUS | Status: DC

## 2018-03-19 MED ORDER — ACETAMINOPHEN 650 MG RE SUPP: 650.0000 mg | RECTAL | Status: DC | PRN

## 2018-03-19 NOTE — Significant Event (Signed)
Rapid Response Event Note  Overview:  Called by bedside RN for pt unresponsive. Instructed RN to get Vitals and CBG and call with results.     On arrival, pt in bed opening eyes to noxious stimuli but not following commands, aphasic. HR 95, BP 135/76, RR 22, spO2 100% on 2L [place]. Pinpoint pupils noted, pulse strong bilaterally. Baltazar Najjar NP at bedside assessing pt. Instructed to call a code stroke. Initial NIH 21. Pt taken to CT. Dr Rory Percy examined pt, will order for MRI tomorrow. Labs and abg ordered.    Plan of Care (if not transferred): Continue to monitor neuro status. BP control. MRI, awaiting lab results. Instructed bedside RN to call with any changes, questions or concerns.   Event Summary:  arrived at  0115    event ended at  [place]

## 2018-03-19 NOTE — Progress Notes (Signed)
Initial Nutrition Assessment  DOCUMENTATION CODES:   Severe malnutrition in context of chronic illness  INTERVENTION:   RD will order supplements when diet advanced   Pt likely at high refeeding risk; recommend monitor K, Mg and P once oral intake resumes.   NUTRITION DIAGNOSIS:   Severe Malnutrition related to chronic illness(CKD, CHF, dementia ) as evidenced by severe fat depletion, severe muscle depletion, 28 percent weight loss in 8 months.  GOAL:   Patient will meet greater than or equal to 90% of their needs  MONITOR:   Labs, Weight trends, I & O's, Skin, Diet advancement  REASON FOR ASSESSMENT:   Consult Assessment of nutrition requirement/status  ASSESSMENT:   84 y.o. female past medical history of AAA, diabetes, depression, hypercholesterolemia, hypertension, ICH last year secondary to being on anticoagulation for PE, documented atrial fibrillation, significant dementia, who was admitted for acute on chronic hypoxic respiratory failure likely due to CHF exacerbation. Pt also found to have CVA   Visited pt's room today. Unable to speak with pt r/t dementia so history obtained from pt's caregiver at bedside. Per caregiver, pt with fair appetite and oral intake at home. Caregiver reports that pt will eat small amounts of meals and pt does drink Glucerna daily (any flavor). Pt does wear dentures but is able to eat a regular diet at home. Caregiver reports significant wt loss; per chart, pt with 46lb(28%) wt loss in 8 months and 15lb(12%) wt loss over the past 5 months. This is severe weight loss. Caregiver believes weight loss is related to pt with frequent hospital admits over the past 8 months. Pt is currently NPO r/t new CVA found after rapid response called. Pt s/p MRI today. SLP consult pending. RD will order supplements pending SLP evaluation. Pt did like Magic Cups in the past when she was admitted. Suspect pt likely at high refeeding risk; recommend monitor K, Mg and P  once oral intake resumes.   Medications reviewed and include: allopurinol, ferrous sulfate, synthroid, senokot, pepcid, Mg sulfate  Labs reviewed: K 3.5 wnl, creat 1.52(H), Mg 1.3(L)  NUTRITION - FOCUSED PHYSICAL EXAM:    Most Recent Value  Orbital Region  No depletion  Upper Arm Region  Severe depletion  Thoracic and Lumbar Region  Moderate depletion  Buccal Region  Mild depletion  Temple Region  Mild depletion  Clavicle Bone Region  Severe depletion  Clavicle and Acromion Bone Region  Severe depletion  Scapular Bone Region  Moderate depletion  Dorsal Hand  Severe depletion  Patellar Region  Severe depletion  Anterior Thigh Region  Severe depletion  Posterior Calf Region  Severe depletion  Edema (RD Assessment)  Mild  Hair  Reviewed  Eyes  Reviewed  Mouth  Reviewed  Skin  Reviewed  Nails  Reviewed     Diet Order:   Diet Order            Diet NPO time specified  Diet effective now             EDUCATION NEEDS:   Not appropriate for education at this time  Skin:  Skin Assessment: Reviewed RN Assessment  Last BM:  8/27- TYPE 7  Height:   Ht Readings from Last 1 Encounters:  03/17/18 5\' 8"  (1.727 m)    Weight:   Wt Readings from Last 1 Encounters:  03/19/18 51.6 kg    Ideal Body Weight:  63.6 kg  BMI:  Body mass index is 17.3 kg/m.  Estimated Nutritional Needs:  Kcal:  1400-1600kcal/day   Protein:  73-84g/day   Fluid:  >1.4L/day   Koleen Distance MS, RD, LDN Pager #- [phone] Office#- [phone] After Hours Pager: [phone]

## 2018-03-19 NOTE — Progress Notes (Signed)
OT Cancellation Note  Patient Details Name: Nicole [name] MRN: [id number] DOB: 11/26/1930   Cancelled Treatment:    Reason Eval/Treat Not Completed: Medical issues which prohibited therapy. Due to events of early this morning involving Rapid Response and seizure activity this morning, RN requesting therapy be deferred today. Will continue to follow.  Malka So 03/19/2018, 9:31 AM  03/19/2018 Nestor Lewandowsky, OTR/L Pager: [phone]

## 2018-03-19 NOTE — Progress Notes (Addendum)
Shift event: NP paged due to pt being found basically unresponsive except for opening of eyes. Non verbal. Does not follow commands. Was last seen normal and talking at MN. Sugar 105. NP to bedside. S: Pt can not participate in ROS due to mental status. Per RN, last seen normal for pt at MN, talking and MOE x 4. O: Chronically ill appearing elderly female in NAD. Slightly diaphoretic. Eyes open. Doesn't track but no gaze preference. PERRL, pinpoint. Does not follow commands. Will not hold up arms or move legs. Does move UEs spontaneously but will not participate in neuro exam. BP 135. HR 90 in Afib per EKG. SaO2 upper 90s on 2L (on this at home). Lungs CTA. Card: Irreg irreg.  A/P: 1. Acute change in mental status-code stroke called and pt taken for emergent CT. Neurologist already in [place] and looked at films and examined pt. 44mm hemorrhage noted per radiologist on CT. Neuro putting in orders. For now, d/c ASA and goal BP around 140 or less. MRI tomorrow. Rest per neuro. Will go ahead and check metabolic reasons for sx since she is non focal. CMP, ABG, ammonia and TSH. Pt is already in SDU. Sugar 105. See neuro consult and recs.  2. Hx Afib-was on coumadin in past, had PE in 2018 and was put on Eliquis. However, this was d/c'd due to frequent falls and hx of ICH in or around June of 2018. Now, only on 81mg  ASA which has now been d/c'd due to CT result.  KJKG, NP [facility] Update: PCO2 67 on ABG. Suspect this is baseline since pt has chronic respiratory failure, but can trial Bipap. Awaiting other lab results.  Update: Bipap made no difference in PCO2 or mental status. TSH high, increased synthroid to 43mcg. Replaced Mg this am. Ammonia level normal. Creat bumped.  KJKG, NP [facility]  Total critical care time: 50 minutes Critical care time was exclusive of separately billable procedures and treating other patients. Critical care was necessary to treat or prevent imminent or life-threatening  deterioration. Critical care was time spent personally by me on the following activities: development of treatment plan with patient and/or surrogate as well as nursing, discussions with consultants, evaluation of patient's response to treatment, examination of patient, obtaining history from patient or surrogate, ordering and performing treatments and interventions, ordering and review of laboratory studies, ordering and review of radiographic studies, pulse oximetry and re-evaluation of patient's condition.

## 2018-03-19 NOTE — Procedures (Signed)
ELECTROENCEPHALOGRAM REPORT   Patient: Nicole [name]       Room #: 8Q91Q EEG No. ID: [id number] Age: 84 y.o.        Sex: female Referring Physician: Erlinda Hong Report Date:  03/19/2018        Interpreting Physician: Alexis Goodell  History: Nicole [name] is an 84 y.o. female with history of stroke noted to have a seizure earlier today.  Medications:  Amlodipine, Cardizem, Aricept, Pepcid, Ferrous sulfate, Imdur, Vimpat, Synthroid, Toprol, Zoloft  Conditions of Recording:  This is a 21 channel routine scalp EEG performed with bipolar and monopolar montages arranged in accordance to the international 10/20 system of electrode placement. One channel was dedicated to EKG recording.  The patient is in the lethargic state.  Description:  The background activity is slow and poorly organized.  It consists of a low voltage polymorphic delta activity that is continuous and diffusely distributed.  With both tactile and verbal stimulation the voltage increases.  There is no quickening of the background noted.  After stimulation there is noted some periodic transients of triphasic morphology.   No epileptiform activity is noted.   Hyperventilation and intermittent photic stimulation were not performed.  IMPRESSION: This is an abnormal EEG secondary to general background slowing.  This finding may be seen with a diffuse disturbance that is etiologically nonspecific, but may include a metabolic encephalopathy or post-ictal state, among other possibilities.  There was a short period when some triphasic wave activity was noted as well which again is consistent with encephalopathy.  Clinical correlation recommended.       Alexis Goodell, MD Neurology [phone] 03/19/2018, 6:42 PM

## 2018-03-19 NOTE — Progress Notes (Addendum)
Nicole [name] paged the following 0105:  "2W08:[place]. Patient unresponsive. questioning stroke. CBG:107.VXB:OZWR low 100's. BP still elevated 184/89. 97%2L. pin point pupils.no pain response"  Rapid called at 1255.  Nicole [name] paged Miggy.Faden.  Not on anticoag due to frequent falls at home. A-Flutter at baseline.   Last seen normal 0000.

## 2018-03-19 NOTE — Progress Notes (Addendum)
SLP Cancellation Note  Patient Details Name: SYMPHANIE CEDERBERG MRN: [id number] DOB: 1931/02/25   Cancelled treatment:       Reason Eval/Treat Not Completed: Patient at procedure or test/unavailable (transport present, pt to leave the floor for testing). Will f/u as able.  SLP called RN this afternoon to assess readiness for testing. Per her report, the pt is very lethargic, doesn't arouse well. She recommends holding evaluations for now.    Germain Osgood 03/19/2018, 10:27 AM  Germain Osgood, M.A. CCC-SLP [phone]

## 2018-03-19 NOTE — Progress Notes (Signed)
PROGRESS NOTE    Nicole [name]  QZE:[id number] DOB: 1930-08-01 DOA: 03/17/2018 PCP: [name], DO    Brief Narrative:  84 year old female who presented with dyspnea.  She does have significant past medical history for chronic hypoxic respiratory failure, (3 LPM), diastolic heart failure, chronic atrial fibrillation, type 2 diabetes mellitus, and dementia.  Reported 3 days of worsening dyspnea, associated with lower extremity edema, no chest pain or palpitations.  On initial physical examination blood pressure 206/140, heart rate 137, respiratory 25-33, temperature 98.3, oxygen saturation 98%.  Heart S1-S2 present, tachycardic, irregularly irregular, lungs with decreased breath sounds, predominantly on the right base, abdomen soft nontender, trace lower extremity edema.  Patient was oriented to self and place.  Her chest x-ray had a right pleural effusion.  EKG had atrial fibrillation, normal axis, right bundle branch block.  Patient was admitted to the hospital with working diagnosis of acute on chronic diastolic heart failure complicated by chronic hypoxic respiratory failure, atrial fibrillation with rapid ventricular response and right pleural effusion.   08/28.  New ischemic/hemorrhagic CVA.  Assessment & Plan:   Principal Problem:   Acute respiratory distress Active Problems:   Anemia   Acute on chronic systolic heart failure (HCC)   Essential hypertension   Bradyarrhythmia   DNR (do not resuscitate)   Diabetes mellitus ([place])   Anxiety   CAD (coronary artery disease)   Pleural effusion   1. Acute CVA complicated with seizure and acute hypoxic respiratory heart failure. Patient has become more stable after the seizure event but continue to be in high risk of decompensation or recurrent seizure. Will continue neuro checks, aspiration and seizure precautions. Add as needed IV lorazepam in case recurrent seizure. Holding on asa and other anticoagulants due to intracranial  bleed. Will follow with neurology recommendations.    2. Acute on chronic diastolic heart failure exacerbation with bilateral pleural effusions, more right than left. Patient more euvolemic today, will hold on IV furosemide, and will follow on thoracentesis. Continue oxymetry monitoring, on hold po meds until swallow evaluation.   3. Chronic atrial fibrillation. Rapid ventricular response, last echocardiaogram with preserved LV systolic function. Will continue telemetry monitoring, add as needed metoprolol IV, if persistent tachycardia will plan diltiazem infusion. No anticoagulation due to intracranial bleed.    4. HTN. Will continue blood pressure monitoring. Holding antihypertensive meds, target systolic 226 to 333 for now.   5. T2DM. Continue glucose cover and monitoring with insulin sliding scale, capillary glucose 86, 111, 76, 71, 109.   6. Hypothyroid. If continue to be NPO will change to IV levothyroxine.   DVT prophylaxis: scd   Code Status: dnr  Family Communication: I spoke with patient's family at the bedside and all questions were addressed.  Disposition Plan/ discharge barriers: patient with acute deterioration, pending clinical improvement    Consultants:   Neurology   Procedures:     Antimicrobials:       Subjective: Patient this am had seizures #2, generalized, rapid response was called, she had tachycardia and hypoxemia, placed on Bipap, after seizure resolved, she is back to nasal cannula.   Objective: Vitals:   03/19/18 0649 03/19/18 0700 03/19/18 0730 03/19/18 0738  BP:  (!) 146/98  (!) 168/101  Pulse:  (!) 105 (!) 167 (!) 137  Resp:  (!) 21 (!) 23 (!) 36  Temp:      TempSrc:      SpO2: 100% 99% 100% 98%  Weight:      Height:  Intake/Output Summary (Last 24 hours) at 03/19/2018 0816 Last data filed at 03/18/2018 2000 Gross per 24 hour  Intake 300 ml  Output 850 ml  Net -550 ml   Filed Weights   03/17/18 2210  Weight: 52.8 kg     Examination:   General: deconditioned and ill looking appearing  Neurology: somnolent, not following commands E ENT: mild pallor, no icterus, oral mucosa dry Cardiovascular: No JVD. S1-S2 present, irregularly irregulat, no gallops, rubs, or murmurs. Trace lower extremity edema. Pulmonary:  Decreased sounds bilaterally,decreased air movement, no wheezing, rhonchi or rales. Gastrointestinal. Abdomen with  no organomegaly, non tender, no rebound or guarding Skin. No rashes Musculoskeletal: no joint deformities     Data Reviewed: I have personally reviewed following labs and imaging studies  CBC: Recent Labs  Lab 03/17/18 1421 03/18/18 0243  WBC 6.3 6.9  NEUTROABS 4.8  --   HGB 12.4 12.2  HCT 36.6 35.9*  MCV 81.7 79.1  PLT 219 542   Basic Metabolic Panel: Recent Labs  Lab 03/17/18 1421 03/18/18 0243 03/19/18 0206  NA 146* 145 145  K 3.2* 3.2* 3.5  CL 101 101 101  CO2 36* 34* 34*  GLUCOSE 134* 116* 103*  BUN 10 12 13   CREATININE 1.12* 1.00 1.52*  CALCIUM 9.5 9.1 9.1  MG  --   --  1.3*   GFR: Estimated Creatinine Clearance: 23 mL/min (A) (by C-G formula based on SCr of 1.52 mg/dL (H)). Liver Function Tests: Recent Labs  Lab 03/19/18 0206  AST 19  ALT 7  ALKPHOS 80  BILITOT 0.8  PROT 6.7  ALBUMIN 2.8*   No results for input(s): LIPASE, AMYLASE in the last 168 hours. Recent Labs  Lab 03/19/18 0206  AMMONIA 17   Coagulation Profile: No results for input(s): INR, PROTIME in the last 168 hours. Cardiac Enzymes: No results for input(s): CKTOTAL, CKMB, CKMBINDEX, TROPONINI in the last 168 hours. BNP (last 3 results) No results for input(s): PROBNP in the last 8760 hours. HbA1C: No results for input(s): HGBA1C in the last 72 hours. CBG: Recent Labs  Lab 03/19/18 0100  GLUCAP 109*   Lipid Profile: No results for input(s): CHOL, HDL, LDLCALC, TRIG, CHOLHDL, LDLDIRECT in the last 72 hours. Thyroid Function Tests: Recent Labs    03/19/18 0209   TSH 6.512*   Anemia Panel: No results for input(s): VITAMINB12, FOLATE, FERRITIN, TIBC, IRON, RETICCTPCT in the last 72 hours.    Radiology Studies: I have reviewed all of the imaging during this hospital visit personally     Scheduled Meds: . allopurinol  100 mg Oral Daily  . diltiazem  30 mg Oral BID  . donepezil  10 mg Oral QHS  . ferrous sulfate  325 mg Oral BID WC  . furosemide  40 mg Intravenous Daily  . isosorbide mononitrate  30 mg Oral BID  . levothyroxine  50 mcg Oral QAC breakfast  . metoprolol succinate  25 mg Oral Daily  . senna-docusate  1 tablet Oral BID  . sertraline  50 mg Oral Daily  . sodium chloride flush  3 mL Intravenous Q12H   Continuous Infusions: . sodium chloride    . famotidine (PEPCID) IV    . magnesium sulfate 1 - 4 g bolus IVPB       LOS: 2 days        Mauricio Gerome Apley, MD [facility] Pager [phone]

## 2018-03-19 NOTE — Consult Note (Addendum)
Neurology Consultation  Reason for Consult: Code stroke Referring Physician: Dr. Bonner Puna  CC: Unresponsiveness  History is obtained from: Chart  HPI: Nicole [name] is a 84 y.o. female past medical history of AAA, diabetes, depression, hypercholesterolemia, hypertension, ICH last year secondary to being on anticoagulation for PE, documented atrial fibrillation, significant dementia, who was admitted for acute on chronic hypoxic respiratory failure likely due to CHF exacerbation, was in her usual state of health till about 12:01 AM on 03/19/2018, and was noted around 1:30 AM to be unresponsive to voice or noxious stim. Patient is currently not on anticoagulation, and continues to be on aspirin.  Her anticoagulation was discontinued because of the ICH and frequent falls. Patient is unable to provide any history at this time and history is obtained from chart review only. She was taken for a stat head CT, which showed a possible small 5 mm right thalamic ICH with no IVH.   LKW: 12:01 AM on 03/19/2018 tpa given?: no, possible ICH, not in acute ischemic stroke. Premorbid modified Rankin scale (mRS): 4 ICH score-2 (1 for age, 1 for GCS 12)  ROS: Able to perform due to patient's mentation  Past Medical History:  Diagnosis Date  . AAA (abdominal aortic aneurysm) ([place])   . Anemia   . Anxiety   . Arthritis   . Atrial fibrillation ([place])   . Cervicalgia   . Cholelithiasis   . Chronic systolic heart failure ([place])   . CKD (chronic kidney disease)   . Colon cancer ([place]) 07/1997  . Depression   . Diabetes mellitus ([place])   . Dizziness and giddiness   . Electrolyte and fluid disorders not elsewhere classified   . GERD (gastroesophageal reflux disease)   . Gout, unspecified   . Hemorrhage of rectum and anus   . Hypercholesteremia   . Hypertension   . Hypoglycemia, unspecified   . Hypopotassemia   . Hypothyroidism   . Kidney stone    renal calculi  . Other chronic allergic conjunctivitis    . Other specified cardiac dysrhythmias(427.89)   . Pain in joint, lower leg   . Perforation of bile duct   . Proteinuria   . Shortness of breath   . Small bowel obstruction due to adhesions ([place]) 05/16/2012  . Stroke ([place])   . Swelling, mass, or lump in head and neck   . Thoracic aneurysm without mention of rupture    4.6 cm Asc Aortic aneurysm, CT 07/2015    Family History  Problem Relation Age of Onset  . Heart disease Mother   . Stroke Father   . Hypertension Daughter   . Hypertension Son   . Diabetes Sister   . Hypertension Son    Social History:   reports that she has never smoked. She has never used smokeless tobacco. She reports that she does not drink alcohol or use drugs.  Medications  Current Facility-Administered Medications:  .   stroke: mapping our early stages of recovery book, , Does not apply, Once, Amie Portland, MD .  0.9 %  sodium chloride infusion, 250 mL, Intravenous, PRN, Black, Lezlie Octave, NP .  acetaminophen (TYLENOL) tablet 650 mg, 650 mg, Oral, Q4H PRN **OR** acetaminophen (TYLENOL) solution 650 mg, 650 mg, Per Tube, Q4H PRN **OR** acetaminophen (TYLENOL) suppository 650 mg, 650 mg, Rectal, Q4H PRN, Amie Portland, MD .  acetaminophen (TYLENOL) tablet 650 mg, 650 mg, Oral, Q6H PRN, 650 mg at 03/18/18 2024 **OR** acetaminophen (TYLENOL) suppository 650 mg, 650 mg, Rectal,  Q6H PRN, Black, Karen M, NP .  albuterol (PROVENTIL) (2.5 MG/3ML) 0.083% nebulizer solution 2.5 mg, 2.5 mg, Nebulization, Q4H PRN, Patrecia Pour, MD .  allopurinol (ZYLOPRIM) tablet 100 mg, 100 mg, Oral, Daily, Black, Karen M, NP, 100 mg at 03/18/18 0957 .  diltiazem (CARDIZEM) tablet 30 mg, 30 mg, Oral, BID, Black, Karen M, NP, 30 mg at 03/18/18 2024 .  donepezil (ARICEPT) tablet 10 mg, 10 mg, Oral, QHS, Black, Karen M, NP, 10 mg at 03/18/18 2023 .  famotidine (PEPCID) IVPB 20 mg premix, 20 mg, Intravenous, Q12H, Amie Portland, MD .  ferrous sulfate tablet 325 mg, 325 mg, Oral, BID WC,  Black, Karen M, NP, 325 mg at 03/18/18 1730 .  furosemide (LASIX) injection 40 mg, 40 mg, Intravenous, Daily, Vance Gather B, MD, 40 mg at 03/18/18 1730 .  hydrALAZINE (APRESOLINE) injection 10 mg, 10 mg, Intravenous, Q4H PRN, Kirby-Graham, Karsten Fells, NP .  HYDROcodone-acetaminophen (NORCO/VICODIN) 5-325 MG per tablet 1-2 tablet, 1-2 tablet, Oral, Q4H PRN, Radene Gunning, NP, 1 tablet at 03/17/18 1710 .  isosorbide mononitrate (IMDUR) 24 hr tablet 30 mg, 30 mg, Oral, BID, Black, Karen M, NP, 30 mg at 03/18/18 2024 .  levothyroxine (SYNTHROID, LEVOTHROID) tablet 25 mcg, 25 mcg, Oral, QAC breakfast, Black, Lezlie Octave, NP, 25 mcg at 03/18/18 0817 .  metoprolol succinate (TOPROL-XL) 24 hr tablet 25 mg, 25 mg, Oral, Daily, Black, Karen M, NP, 25 mg at 03/18/18 0820 .  ondansetron (ZOFRAN) tablet 4 mg, 4 mg, Oral, Q6H PRN **OR** ondansetron (ZOFRAN) injection 4 mg, 4 mg, Intravenous, Q6H PRN, Black, Karen M, NP .  senna-docusate (Senokot-S) tablet 1 tablet, 1 tablet, Oral, QHS PRN, Black, Karen M, NP .  senna-docusate (Senokot-S) tablet 1 tablet, 1 tablet, Oral, BID, Amie Portland, MD .  sertraline (ZOLOFT) tablet 50 mg, 50 mg, Oral, Daily, Black, Karen M, NP, 50 mg at 03/18/18 0956 .  sodium chloride flush (NS) 0.9 % injection 3 mL, 3 mL, Intravenous, Q12H, Black, Karen M, NP, 3 mL at 03/18/18 2025 .  sodium chloride flush (NS) 0.9 % injection 3 mL, 3 mL, Intravenous, PRN, Black, Lezlie Octave, NP  Exam: Current vital signs: BP (!) 159/84 (BP Location: Right Arm)   Pulse 74   Temp 98 F (36.7 C) (Oral)   Resp 20   Ht 5\' 8"  (1.727 m)   Wt 52.8 kg   SpO2 97%   BMI 17.70 kg/m  Vital signs in last 24 hours: Temp:  [97.4 F (36.3 C)-98.4 F (36.9 C)] 98 F (36.7 C) (08/27 2300) Pulse Rate:  [74-150] 74 (08/28 0200) Resp:  [18-28] 20 (08/28 0200) BP: (117-184)/(76-122) 159/84 (08/28 0200) SpO2:  [95 %-100 %] 97 % (08/28 0200) General: Patient is awake, in no apparent distress HEENT: Normocephalic  atraumatic CVS: S1-S2 heard, regular rate rhythm Respiratory: Chest clear to auscultation Extremities: No pedal edema Neurological exam She is awake, alert, does not appear to be in distress but is completely mute. She does not follow any commands or does not mimic. She does attempt to track the examiner.  Appears to have very poor attention concentration. Cranial nerves: Pupils are equal round reactive to light, extraocular movements appear intact, no gaze deviation or preference, blinks to threat from both sides, no facial asymmetry. Motor exam: Spontaneously moving the left upper extremity more than the right upper extremity but is at least antigravity in both.  Does not withdraw the lower extremity to noxious stimulus and does not move  it spontaneously. Sensory exam: As above NIHSS 1a Level of Conscious.: 0 1b LOC Questions: 2 1c LOC Commands: 2 2 Best Gaze: 0 3 Visual: 0 4 Facial Palsy: 0 5a Motor Arm - left: 0 5b Motor Arm - Right: 0 6a Motor Leg - Left: 2 6b Motor Leg - Right: 2 7 Limb Ataxia: 0 8 Sensory: 0 9 Best Language: 3 10 Dysarthria: 2 11 Extinct. and Inatten.: 0 TOTAL: 13  Labs I have reviewed labs in epic and the results pertinent to this consultation are: CBC    Component Value Date/Time   WBC 6.9 03/18/2018 0243   RBC 4.54 03/18/2018 0243   HGB 12.2 03/18/2018 0243   HGB 9.8 (L) 10/12/2015 0928   HGB 12.2 08/14/2012 1014   HCT 35.9 (L) 03/18/2018 0243   HCT 34.2 01/11/2017 2123   HCT 37.2 08/14/2012 1014   PLT 219 03/18/2018 0243   PLT 529 (H) 10/12/2015 0928   MCV 79.1 03/18/2018 0243   MCV 65 (L) 10/12/2015 0928   MCV 77.6 (L) 08/14/2012 1014   MCH 26.9 03/18/2018 0243   MCHC 34.0 03/18/2018 0243   RDW 15.0 03/18/2018 0243   RDW 22.3 (H) 10/12/2015 0928   RDW 20.2 (H) 08/14/2012 1014   LYMPHSABS 1.1 03/17/2018 1421   LYMPHSABS 1.7 10/12/2015 0928   LYMPHSABS 2.3 08/14/2012 1014   MONOABS 0.3 03/17/2018 1421   MONOABS 0.7 08/14/2012 1014    EOSABS 0.0 03/17/2018 1421   EOSABS 0.2 10/12/2015 0928   BASOSABS 0.0 03/17/2018 1421   BASOSABS 0.0 10/12/2015 0928   BASOSABS 0.0 08/14/2012 1014   CMP     Component Value Date/Time   NA 145 03/18/2018 0243   NA 142 06/26/2017 1254   K 3.2 (L) 03/18/2018 0243   CL 101 03/18/2018 0243   CO2 34 (H) 03/18/2018 0243   GLUCOSE 116 (H) 03/18/2018 0243   BUN 12 03/18/2018 0243   BUN 11 06/26/2017 1254   CREATININE 1.00 03/18/2018 0243   CREATININE 1.29 (H) 08/08/2017 1342   CALCIUM 9.1 03/18/2018 0243   PROT 7.2 02/26/2018 0823   PROT 7.0 12/03/2013 0833   ALBUMIN 2.8 (L) 02/26/2018 0823   ALBUMIN 4.3 12/03/2013 0833   AST 19 02/26/2018 0823   ALT 9 02/26/2018 0823   ALKPHOS 87 02/26/2018 0823   BILITOT 1.1 02/26/2018 0823   GFRNONAA 50 (L) 03/18/2018 0243   GFRNONAA 38 (L) 08/08/2017 1342   GFRAA 58 (L) 03/18/2018 0243   GFRAA 44 (L) 08/08/2017 1342    Imaging I have reviewed the images obtained:  CT-scan of the brain-extensive white matter changes with possible 5 mm focus of hyperdensity in the right thalamus that is new compared to prior CT. Prior MRI suggestive of multifocal hypodense lesions on the SWI images consistent with microhemorrhages most likely secondary to amyloid angiopathy.  Chronic right basal ganglia infarct.  Moderate to severe chronic small vessel ischemic disease.  Assessment:  84 year old woman with past medical history of AAA, diabetes, depression, hypercholesterolemia, hypertension, ICH last year secondary to anticoagulation from PE, also evidence of amyloid angiopathy on MRI, significant dementia, admitted for acute on chronic respiratory failure due to CHF exacerbation with sudden onset of unresponsiveness and concern for stroke evaluated by neurology. Head exam does not fit any vascular territory.  CT imaging suggestive of a possible new focus of bleed in the right thalamus-?  Hypertensive bleed versus incidental finding. I am not sure if her current  clinical picture can be explained  by the small bleed but given the fact that she has extensive chronic white matter disease and dementia, this might be enough to cause an acute neurological change. She is not a candidate for IV TPA due to the acute bleed. Location of the bleed is deep gray matter, less concerning for causing any sort of seizure activity, but clinical exam is very puzzling and seizures should be considered given the history of dementia.  Impression: Possible thalamic ICH-likely hypertensive History of prior ICH History of dementia History of hypertension Currently being treated for CHF exacerbation. Evaluate for seizures  Recommendations: Frequent neurochecks Stepdown level of care DC aspirin DC any anticoagulation-even heparin for subcu prophylaxis Blood pressure goal less than 140/90.  Use labetalol as needed.  If difficult to control with that, consider transfer to ICU for Cleviprex drip. MRI of the brain in the morning. Routine EEG in the morning Check ammonia level Check ABG Management of toxic metabolic derangements per primary team as you are. Stroke team will follow with you in the morning.  -- Amie Portland, MD [facility] Pager: [phone] If 7pm to 7am, please call on call as listed on AMION.  CRITICAL CARE ATTESTATION This patient is critically ill and at significant risk of neurological worsening, death and care requires constant monitoring of vital signs, hemodynamics,respiratory and cardiac monitoring. I spent 40  minutes of neurocritical care time performing neurological assessment, discussion with family, other specialists and medical decision making of high complexityin the care of  this patient.

## 2018-03-19 NOTE — Progress Notes (Signed)
PT Cancellation Note  Patient Details Name: Nicole [name] MRN: [id number] DOB: 08-12-1930   Cancelled Treatment:    Reason Eval/Treat Not Completed: (P) Medical issues which prohibited therapy Due to events of early this morning involving Rapid Response and seizure activity this morning, RN requesting therapy be deferred today. Will continue to follow.  Sandrea Boer B. Migdalia Dk PT, DPT Acute Rehabilitation  [phone] Pager [phone]   [place] 03/19/2018, 9:36 AM

## 2018-03-19 NOTE — Significant Event (Addendum)
Rapid Response Event Note RN called for pt seizing  Overview: Time Called: 7262 Arrival Time: 0734 Event Type: Neurologic  Initial Focused Assessment: On arrival pt lying supine in bed, HR 150's, O2 sats 54% on 2L [place], pt not responding to verbal or painful stimuli. Per RN she has not be responsive, does not follow commands. Pupils 1 non reactive. Family is requesting to make pt comfortable. O2 sats gradually increased to 77%, 100% after Bipap. A-Fib 120-150's. Small amount of blood to the front of her tongue. No loss of bowel or bladder.   Interventions: Bipap  Plan of Care (if not transferred): Continue to monitor. Call RRT for any concerns Event Summary: Name of Physician Notified: Dr. Dwaine Gale  at (PTA RRT )    at    Outcome: Stayed in room and stabalized     [place], [place]

## 2018-03-19 NOTE — Progress Notes (Signed)
RT asked to go to patient's room due to a rapid response called.  Pt found on [place] sat 58%. RT asked to place pt back on Bipap.  Pt's sat improved to 98%. RR RN at bedside.

## 2018-03-19 NOTE — Progress Notes (Signed)
[facility]  Patient Status: Active (receiving services up to time of hospitalization)  [facility] is providing the following services: RN  If patient discharges after hours, please call [phone].   Nicole [name] 03/19/2018, 12:55 PM

## 2018-03-19 NOTE — Progress Notes (Addendum)
STROKE TEAM PROGRESS NOTE   INTERVAL HISTORY Her daughter is at the bedside.  States she had a seizure this am. RN describes tonic-clonic activity that lasted 30 secs around 7a. Upon review, question if pt had unwitnessed seizure during the night and staff found unresponsive - leading to CT head with ? Subacute R thalamic hemorrhage (MRI shows multiple multiple old microvascular hemorrhages). Will likely load with antiepileptic. Discussed w/ Dr. Erlinda Hong who will see and make final decision.  At baseline she is awake, alert. RN reports her trying to get OOB yesterday, not following commands. Has baseline dementia. Followed by [facility].  Pt currently appears post ictal - sleepy, lethargic, no speech output only moans, MAEx4. EEG on the way to assess.  Vitals:   03/19/18 0738 03/19/18 0835 03/19/18 0905 03/19/18 0915  BP: (!) 168/101 (!) 152/87 (!) 173/82 (!) 173/82  Pulse: (!) 137 (!) 142    Resp: (!) 36 17    Temp:      TempSrc:  Oral    SpO2: 98% 100%    Weight:      Height:        CBC:  Recent Labs  Lab 03/17/18 1421 03/18/18 0243  WBC 6.3 6.9  NEUTROABS 4.8  --   HGB 12.4 12.2  HCT 36.6 35.9*  MCV 81.7 79.1  PLT 219 400    Basic Metabolic Panel:  Recent Labs  Lab 03/18/18 0243 03/19/18 0206  NA 145 145  K 3.2* 3.5  CL 101 101  CO2 34* 34*  GLUCOSE 116* 103*  BUN 12 13  CREATININE 1.00 1.52*  CALCIUM 9.1 9.1  MG  --  1.3*   Lipid Panel:     Component Value Date/Time   CHOL 173 10/10/2017 0344   CHOL 142 10/12/2015 0928   TRIG 87 10/10/2017 0344   HDL 37 (L) 10/10/2017 0344   HDL 47 10/12/2015 0928   CHOLHDL 4.7 10/10/2017 0344   VLDL 17 10/10/2017 0344   LDLCALC 119 (H) 10/10/2017 0344   LDLCALC 79 10/12/2015 0928   HgbA1c:  Lab Results  Component Value Date   HGBA1C 5.5 10/10/2017   Urine Drug Screen:     Component Value Date/Time   LABOPIA NONE DETECTED 07/20/2016 2021   COCAINSCRNUR NONE DETECTED 07/20/2016 2021   LABBENZ NONE DETECTED 07/20/2016 2021    AMPHETMU NONE DETECTED 07/20/2016 2021   THCU NONE DETECTED 07/20/2016 2021   LABBARB NONE DETECTED 07/20/2016 2021    Alcohol Level No results found for: [facility]  IMAGING Dg Chest Portable 1 View  Result Date: 03/17/2018 CLINICAL DATA:  Acute dyspnea. EXAM: PORTABLE CHEST 1 VIEW COMPARISON:  Chest radiograph February 28, 2018 FINDINGS: Increasing cardiomegaly. Calcified aortic arch. Pulmonary vascular congestion diffuse interstitial prominence. Increased moderate RIGHT, stable small LEFT pleural effusions with underlying consolidation. Scattered calcified granulomas. No pneumothorax. Osteopenia. IMPRESSION: 1. Cardiomegaly with increasing pulmonary edema. 2. Increased moderate RIGHT and small LEFT pleural effusions. Underlying consolidation. 3.  Aortic Atherosclerosis (ICD10-I70.0). Electronically Signed   By: Elon Alas M.D.   On: 03/17/2018 14:58   Ct Head Code Stroke Wo Contrast  Result Date: 03/19/2018 CLINICAL DATA:  Code stroke.  84 y/o  F; nonverbal and unresponsive. EXAM: CT HEAD WITHOUT CONTRAST TECHNIQUE: Contiguous axial images were obtained from the base of the skull through the vertex without intravenous contrast. COMPARISON:  05/23/2017 MRI head.  06/29/2017 CT head. FINDINGS: Brain: 5 mm density within the right superior thalamus compatible with acute hemorrhage. No associated mass  effect. No large stroke, extra-axial collection, hydrocephalus, or herniation. Advanced chronic microvascular ischemic changes and volume loss of the brain are stable from prior studies. Multiple chronic lacunar infarcts are present within the bilateral basal ganglia. Vascular: Calcific atherosclerosis of the carotid siphons. No hyperdense vessel identified. Skull: Normal. Negative for fracture or focal lesion. Sinuses/Orbits: No acute finding. Other: None. ASPECTS [facility] Stroke Program Early CT Score) - Ganglionic level infarction (caudate, lentiform nuclei, internal capsule, insula, M1-M3 cortex): 7 -  Supraganglionic infarction (M4-M6 cortex): 3 Total score (0-10 with 10 being normal): 10 IMPRESSION: 1. 5 mm right superior thalamus acute hemorrhage. No associated mass effect. 2. No additional acute intracranial abnormality identified. 3. ASPECTS is 10. 4. Stable advanced chronic microvascular ischemic changes and volume loss of the brain as well as multiple small basal ganglia chronic infarcts. These results were called by telephone at the time of interpretation on 03/19/2018 at 1:41 am to Dr. Rory Percy , who verbally acknowledged these results. Electronically Signed   By: Kristine Garbe M.D.   On: 03/19/2018 01:44   2D Echocardiogram  - Left ventricle: The cavity size was normal. Wall thickness was increased in a pattern of moderate LVH. Systolic function was normal. The estimated ejection fraction was in the range of 60% to 65%. Wall motion was normal; there were no regional wall motion abnormalities. The study was not technically sufficient to allow evaluation of LV diastolic dysfunction due to atrial fibrillation. - Aortic valve: Trileaflet; mildly calcified leaflets. There was no stenosis. There was moderate regurgitation. - Aorta: Mildly dilated aortic root and ascending aorta. Ascending aorta dimension: 40 mm. Aortic root dimension: 37 mm (ED). - Mitral valve: Mildly calcified annulus. Mildly calcified leaflets. There was mild regurgitation. - Left atrium: The atrium was moderately dilated. - Right ventricle: The cavity size was mildly to moderately dilated. Systolic function was normal. - Right atrium: The atrium was severely dilated. - Tricuspid valve: Peak RV-RA gradient (S): 54 mm Hg. - Pulmonary arteries: PA peak pressure: 62 mm Hg (S). - Systemic veins: IVC measured 1.9 cm with < 50% respirophasic variation, suggesting RA pressure 8 mmHg. Impressions:   The patient was in atrial fibrillation. Normal LV size with moderate LV hypertrophy. EF 60-65%. Mild to moderate RV dilation with  normal systolic function. Mild MR, moderate aortic insufficiency. Moderate pulmonary hypertension. Biatrial enlargement.   PHYSICAL EXAM General: frail AA female somnolent, will moan to stimulation. In no apparent distress HEENT: Normocephalic atraumatic CVS: E5-U3 heard, irregular irregular rate rhythm Respiratory: Chest clear to auscultation Extremities: No pedal edema  Neurological exam Somnolent, she does not speak, only moans She does not follow any commands and does not mimic. Resists eye opening.  Cranial nerves: Pupils are equal round reactive to light, extraocular movements appear intact, no gaze deviation or preference, blinks to threat from both sides, no facial asymmetry. Motor exam: withdraws to pain all extremities. Moving the right move than the left (opposite of last night)   ASSESSMENT/PLAN Ms. DAWNN NAM is a 84 y.o. female with history of AAA, DB, depression, HLD, HTN, ICH while on AC for PE, AF, significant dementia presenting with chronic hypoxic respiratory failure d/t CHF exacerbation. During hospitalization, found to be unresponsive to voice or noxious stimuli. Not on AC. CT showed small R thalamic ICH. Had witnessed tonic-clonic seizure the following morning.   Seizure  Likely unwitnessed seziure led to unresponsive statue during the night  This am with witnessed tonic clonic activity x 30 secs  EEG pending  NH4 normal  Not on antiepileptic.   Dr. Erlinda Hong to see, decide on appropriate drug  Stroke:  Subacute small right thalamic ICH  Code Stroke CT head small R thalamic ICH. No mass effect. Mult old B BG infarcts. Small vessel disease. Atrophy. ASPECTS 10.     MRI  No acute infarct. Multiple hypertensive hemorrhages. Formal reading pending  MRA  Appears ok. Formal reading pending   2D Echo  EF 60-65%. PT in AF. RA dilated.  LDL 156  HgbA1c 5.1  SCDs for VTE prophylaxis Diet Order            Diet NPO time specified  Diet effective now                aspirin 81 mg daily prior to admission, now on No antithrombotic due to ICH/hypertensive hemorrhages  Therapy recommendations:  pending   Disposition:  pending  (has AHC at home)   Atrial Fibrillation  Home anticoagulation:  none  . Not an AC candidate given multi microvascular hemorrhages seen on MRI  Hypertension  Elevated on arrival  Treated with labetalol  Stable now . BP goal < 160 with hemorrhage  Hyperlipidemia  Home meds:  No statin  LDL 156, goal < 70  Consider statin at discharge  Diabetes type II  HgbA1c 5.1  Controlled  Other Stroke Risk Factors  Advanced age  Hx PE 07/2016 treated w/ Eliquis  Hx ICH  12/2016 - bilateral ICH with right thalamus acute and left frontal subacute, likely due to high BP and small vessel disease in the setting of eliquis for PE treatment  03/2015 - Right caudate head with ventricular extension likely secondary to HTN and warfarin coagulopathy (INR 3.6 on admission)  Family hx stroke (father)  Chronic systolic Congestive heart failure w/ acute exacerbation  AAA, thoracic aneurysm 4.6cm  Other Active Problems  Baseline severe dementia  CKD  Hospital day # 2  Burnetta Sabin, MSN, APRN, ANVP-BC, AGPCNP-BC Advanced Practice Stroke Nurse [place] for Schedule & Pager information 03/19/2018 1:57 PM   ATTENDING NOTE: I reviewed above note and agree with the assessment and plan. I have made any additions or clarifications directly to the above note. Pt was seen and examined.   84 year old female with history of AAA, anemia, A. fib on aspirin, CHF, CKD, dementia, diabetes, hypertension, hyperlipidemia, remote colon cancer admitted for CHF exacerbation and respiratory distress.  She had a remote history of right caudate head ICH and IVH in 03/2015, at that time she was on Coumadin, INR 3.06, EF 55 to 60%.  LDL 97 and A1c 5.9.  She was put on aspirin after blood absorbed in the  follow-up with Dr. Leonie Man at [facility]  In 07/2016 patient had a PE, therefore she was put on Eliquis.  However, 12/2016 she was admitted for bilateral ICH at right thalamus and left frontal lobe.  LDL 109 and A1c 6.9.  Her Eliquis discontinued and was later on put back on aspirin.  As per her caregiver today at bedside, patient at home baseline able to walk slowly and able to talk with simple sentences and able to feed herself.  She was admitted this time for CHF exacerbation and respiratory distress.  However, overnight, she became altered mental status, able to open eyes, but not able to speak or follow commands.  CT showed right thalamic tiny hemorrhage.  An MRI confirmed right thalamic tiny hemorrhage, but also showed numerous micro-bleeds at cortical and subcortical  regions, likely combination of CAA and hypertension.  MRA negative.  EF 60 to 65%.  A1c 5.1, LDL 156, THS 6.5 and ammonia level normal.   Around 7 AM this morning, patient had witnessed tonic-clonic seizure lasting 30 seconds.  EEG result pending.  Currently, patient likely postictal state, drowsy, sleepy, eyes closed, able to open with repetitive stimulation.  However, nonverbal, not following commands.  Inconsistent on eye movement and blinking to visual threat.  PERRL, mild right facial nasolabial fold flattening, tongue midline inside mouth.  3-/5 BUEs, and 1/5 BLEs. Sensation, coordination and gait not tested.  Patient condition concerning for recurrent seizure activity and night and in the morning, and currently postictal state.  Loaded with Vimpat 150 mg and follow with Vimpat 75 mg twice daily.  Will follow up with EEG report.  Patient right thalamic tiny bleed likely incidental finding, but given numerous micro-bleeds with CA and hypertensive combination picture, recommend to avoid antiplatelet or anticoagulation in the future.  Rosalin Hawking, MD PhD Stroke Neurology 03/19/2018 4:14 PM   I spent  35 minutes in total face-to-face time  with the patient, more than 50% of which was spent in counseling and coordination of care, reviewing test results, images and medication, and discussing the diagnosis of seizure, ICH, history of ICH, amyloid angiopathy, treatment plan and potential prognosis. This patient's care requiresreview of multiple databases, neurological assessment, discussion with family, other specialists and medical decision making of high complexity.      To contact Stroke Continuity provider, please refer to [email or web address] After hours, contact General Neurology

## 2018-03-19 NOTE — Progress Notes (Signed)
Patient has BIPAP in room. Patient is in no distress at this time and on [facility] with 02 saturations at 100%. Bipap on standby if needed. RT will continue to monitor.

## 2018-03-19 NOTE — Progress Notes (Signed)
EEG Completed; Results Pending  

## 2018-03-19 NOTE — Procedures (Signed)
PROCEDURE SUMMARY:  Successful US guided right thoracentesis. Yielded 1.3 liters of bloody fluid. Patient tolerated procedure well. No immediate complications.  Post procedure chest X-ray reveals right pneumothorax.  Montoya Brandel S Donnabelle Blanchard PA-C 03/19/2018 10:41 AM

## 2018-03-20 DIAGNOSIS — E876 Hypokalemia: Secondary | ICD-10-CM

## 2018-03-20 DIAGNOSIS — E43 Unspecified severe protein-calorie malnutrition: Secondary | ICD-10-CM

## 2018-03-20 LAB — CBC WITH DIFFERENTIAL/PLATELET
Abs Immature Granulocytes: 0 10*3/uL (ref 0.0–0.1)
BASOS PCT: 1 %
Basophils Absolute: 0 10*3/uL (ref 0.0–0.1)
EOS ABS: 0 10*3/uL (ref 0.0–0.7)
EOS PCT: 1 %
HCT: 35.5 % — ABNORMAL LOW (ref 36.0–46.0)
Hemoglobin: 11.8 g/dL — ABNORMAL LOW (ref 12.0–15.0)
IMMATURE GRANULOCYTES: 0 %
LYMPHS ABS: 1.2 10*3/uL (ref 0.7–4.0)
Lymphocytes Relative: 19 %
MCH: 26.8 pg (ref 26.0–34.0)
MCHC: 33.2 g/dL (ref 30.0–36.0)
MCV: 80.5 fL (ref 78.0–100.0)
Monocytes Absolute: 0.7 10*3/uL (ref 0.1–1.0)
Monocytes Relative: 11 %
NEUTROS PCT: 68 %
Neutro Abs: 4.3 10*3/uL (ref 1.7–7.7)
PLATELETS: 211 10*3/uL (ref 150–400)
RBC: 4.41 MIL/uL (ref 3.87–5.11)
RDW: 15.1 % (ref 11.5–15.5)
WBC: 6.2 10*3/uL (ref 4.0–10.5)

## 2018-03-20 LAB — BASIC METABOLIC PANEL
ANION GAP: 10 (ref 5–15)
BUN: 18 mg/dL (ref 8–23)
CALCIUM: 9.1 mg/dL (ref 8.9–10.3)
CO2: 33 mmol/L — AB (ref 22–32)
CREATININE: 2.15 mg/dL — AB (ref 0.44–1.00)
Chloride: 101 mmol/L (ref 98–111)
GFR calc non Af Amer: 20 mL/min — ABNORMAL LOW (ref >60)
GFR, EST AFRICAN AMERICAN: 23 mL/min — AB (ref >60)
Glucose, Bld: 75 mg/dL (ref 70–99)
Potassium: 4.1 mmol/L (ref 3.5–5.1)
SODIUM: 144 mmol/L (ref 135–145)

## 2018-03-20 MED ORDER — METOPROLOL TARTRATE 12.5 MG HALF TABLET
12.5000 mg | ORAL_TABLET | Freq: Two times a day (BID) | ORAL | Status: DC
  Administered 2018-03-20 – 2018-03-26 (×11): 12.5 mg via ORAL
  Filled 2018-03-20 (×11): qty 1

## 2018-03-20 MED ORDER — FAMOTIDINE 20 MG PO TABS
20.0000 mg | ORAL_TABLET | Freq: Every day | ORAL | Status: DC
  Administered 2018-03-21 – 2018-03-26 (×6): 20 mg via ORAL
  Filled 2018-03-20 (×7): qty 1

## 2018-03-20 NOTE — Care Management Important Message (Signed)
Important Message  Patient Details  Name: Nicole [name] MRN: [id number] Date of Birth: [birth year]   Medicare Important Message Given:  Yes    Orbie Pyo 03/20/2018, 3:27 PM

## 2018-03-20 NOTE — Progress Notes (Signed)
Inpatient Rehabilitation Admissions Coordinator  Noted OT eval. SNF is recommended venue at this time. I will sign off.  Danne Baxter, RN, MSN Rehab Admissions Coordinator [phone] 03/20/2018 3:44 PM

## 2018-03-20 NOTE — Evaluation (Signed)
Speech Language Pathology Evaluation Patient Details Name: Nicole [name] MRN: [id number] DOB: [birth year] Today's Date: 03/20/2018 Time: 4742-5956 SLP Time Calculation (min) (ACUTE ONLY): 20 min  Problem List:  Patient Active Problem List   Diagnosis Date Noted  . Intracerebral hemorrhage 03/19/2018  . Protein-calorie malnutrition, severe 03/19/2018  . Acute on chronic diastolic CHF (congestive heart failure) ([place]) 02/26/2018  . Pleural effusion on right 02/26/2018  . Stage III pressure ulcer of sacral region ([place]) 02/15/2018  . Pleural effusion   . Acute on chronic respiratory failure with hypoxia ([place]) 02/11/2018  . Hypoxemia 02/09/2018  . Atrial flutter ([place]) 10/28/2017  . Pressure injury of skin 10/11/2017  . Hyperkalemia 10/10/2017  . Dementia 10/10/2017  . CAD (coronary artery disease) 10/10/2017  . Elevated troponin 10/10/2017  . Stroke ([place])   . Shortness of breath   . Proteinuria   . Perforation of bile duct   . Pain in joint, lower leg   . Other chronic allergic conjunctivitis   . Kidney stone   . Hypothyroidism   . Hypopotassemia   . Hypoglycemia, unspecified   . Hypertension   . Hypercholesteremia   . Hemorrhage of rectum and anus   . Gout, unspecified   . GERD (gastroesophageal reflux disease)   . Dizziness and giddiness   . Diabetes mellitus ([place])   . Depression   . CKD (chronic kidney disease)   . Chronic systolic heart failure ([place])   . Cervicalgia   . Arthritis   . Anxiety   . AAA (abdominal aortic aneurysm) ([place])   . Moderate protein-calorie malnutrition ([place]) 07/11/2017  . Major depression 05/28/2017  . Adult failure to thrive 05/28/2017  . Acute encephalopathy 05/23/2017  . Acute lower UTI 05/23/2017  . Palliative care by specialist   . DNR (do not resuscitate)   . Bradyarrhythmia 01/11/2017  . Generalized weakness 01/11/2017  . General weakness 01/11/2017  . PE (pulmonary thromboembolism) ([place]) 08/24/2016  . Accelerated hypertension  with diastolic congestive heart failure, NYHA class 3 ([place]) 01/05/2016  . Permanent atrial fibrillation ([place]) 01/05/2016  . Rib pain on right side 10/14/2015  . Hypothyroidism due to acquired atrophy of thyroid 10/14/2015  . Type 2 diabetes mellitus with diabetic nephropathy, without long-term current use of insulin ([place]) 10/14/2015  . Essential hypertension 10/14/2015  . Acute on chronic systolic heart failure ([place]) 08/02/2015  . Bradycardia 07/27/2015  . Chronic anemia 07/27/2015  . Chest pain   . Vascular dementia 06/13/2015  . Memory loss 06/13/2015  . Left hemiparesis ([place])   . Chronic diastolic heart failure ([place])   . Type 2 diabetes mellitus with peripheral neuropathy (HCC)   . ICH (intracerebral hemorrhage) ([place]) 04/04/2015  . Atrial fibrillation, chronic ([place])   . Hypertensive urgency   . SOB (shortness of breath) 04/03/2014  . Diabetic nephropathy ([place]) 09/07/2013  . Chronic constipation 03/25/2013  . Carpal tunnel syndrome 02/25/2013  . Hypokalemia 05/17/2012  . Small bowel obstruction due to adhesions ([place]) 05/16/2012  . Normal coronary arteries, 2008 05/15/2012  . Type II or unspecified type diabetes mellitus with neurological manifestations, not stated as uncontrolled(250.60) 05/15/2012  . Acute respiratory distress 05/15/2012  . Abdominal pain, acute, right upper quadrant 05/12/2012  . Cholecystitis chronic, acute, S/P lap cholecystectomy 05/14/12 05/12/2012  . Thoracic aortic aneurysm without rupture ([place])   . CHF, past NICM with an EF of 30%, most recent echo 2011 showed EF to be45- 50%   . Anemia 08/09/2011  . History of colon cancer, stage  III 08/09/2011  . Colon cancer ([place]) 07/23/1997   Past Medical History:  Past Medical History:  Diagnosis Date  . AAA (abdominal aortic aneurysm) ([place])   . Anemia   . Anxiety   . Arthritis   . Atrial fibrillation ([place])   . Cervicalgia   . Cholelithiasis   . Chronic systolic heart failure ([place])   . CKD (chronic kidney  disease)   . Colon cancer ([place]) 07/1997  . Depression   . Diabetes mellitus ([place])   . Dizziness and giddiness   . Electrolyte and fluid disorders not elsewhere classified   . GERD (gastroesophageal reflux disease)   . Gout, unspecified   . Hemorrhage of rectum and anus   . Hypercholesteremia   . Hypertension   . Hypoglycemia, unspecified   . Hypopotassemia   . Hypothyroidism   . Kidney stone    renal calculi  . Other chronic allergic conjunctivitis   . Other specified cardiac dysrhythmias(427.89)   . Pain in joint, lower leg   . Perforation of bile duct   . Proteinuria   . Shortness of breath   . Small bowel obstruction due to adhesions ([place]) 05/16/2012  . Stroke ([place])   . Swelling, mass, or lump in head and neck   . Thoracic aneurysm without mention of rupture    4.6 cm Asc Aortic aneurysm, CT 07/2015   Past Surgical History:  Past Surgical History:  Procedure Laterality Date  . CARDIAC CATHETERIZATION  2008   moderate severe pulm HTN; with elevted pulm capillary wedge pressure   . CHOLECYSTECTOMY  05/14/2012   Procedure: LAPAROSCOPIC CHOLECYSTECTOMY WITH INTRAOPERATIVE CHOLANGIOGRAM;  Surgeon: Haywood Lasso, MD;  Location: [place];  Service: General;  Laterality: N/A;  . ERCP  05/17/2012   Procedure: ENDOSCOPIC RETROGRADE CHOLANGIOPANCREATOGRAPHY (ERCP);  Surgeon: Missy Sabins, MD;  Location: [place];  Service: Gastroenterology;  Laterality: N/A;  . ERCP  08/12/2012   Procedure: ENDOSCOPIC RETROGRADE CHOLANGIOPANCREATOGRAPHY (ERCP);  Surgeon: Missy Sabins, MD;  Location: [facility] ENDOSCOPY;  Service: Endoscopy;  Laterality: N/A;  . HEMICOLECTOMY  08/11/1997  . IR THORACENTESIS ASP PLEURAL SPACE W/IMG GUIDE  02/10/2018  . IR THORACENTESIS ASP PLEURAL SPACE W/IMG GUIDE  02/26/2018  . IR THORACENTESIS ASP PLEURAL SPACE W/IMG GUIDE  03/19/2018  . KIDNEY STONE SURGERY    . TRANSTHORACIC ECHOCARDIOGRAM  11/2009   EF 45-50%, mild-mod AV regurg; mild MR; mod TR; ascending aorta mildly  dilated   HPI:  84 year old female admitted 03/17/18 with worsening SOB. DC'd 3 weeks ago with similar dx. PMH: chronic hypoxic respiratory failure, 3LNC at home, dCHF, AFib/flutter, DM, dementia, pleural effusion, CKD, colon cancer, SBO, CVA, thoacic aneurysm without rupture.   Assessment / Plan / Recommendation Clinical Impression  Pt had difficulty completing the Mini-Mental State Examination (MMSE), as her responses were perseverative on "December" and "January". Pt was able to tell me her name, but was unable to verbalize her birthday, current time, place, or situation. She was unable to retain 3/3 words for immediate recall, name simple objects, or follow 2 step directions. She did follow 1-step verbal directions inconsistently. There was no family present at the time of this evaluation to discuss prior level of function. Chart review indicates a history of dementia and prior CVA. Palliative Care consult is recommended to facilitate establishment of goals of care.     SLP Assessment  SLP Recommendation/Assessment: All further Speech Language Pathology cognitive-linguistic needs can be addressed in the next venue of care  SLP Visit Diagnosis: Cognitive communication deficit (R41.841)    Follow Up Recommendations  24 hour supervision/assistance    Frequency and Duration min 1 x/week         SLP Evaluation Cognition  Overall Cognitive Status: History of cognitive impairments - at baseline Arousal/Alertness: Awake/alert Orientation Level: Oriented to person;Disoriented to place;Disoriented to time;Disoriented to situation Attention: Focused;Sustained Focused Attention: Appears intact Sustained Attention: Impaired Sustained Attention Impairment: Verbal basic Memory: Impaired Memory Impairment: Storage deficit;Retrieval deficit;Decreased recall of new information;Decreased short term memory Decreased Short Term Memory: Verbal basic Awareness: Impaired Awareness Impairment:  Intellectual impairment Problem Solving: Impaired Problem Solving Impairment: Verbal basic Behaviors: Perseveration Safety/Judgment: Impaired       Comprehension  Auditory Comprehension Overall Auditory Comprehension: Impaired Yes/No Questions: Impaired Basic Biographical Questions: 26-50% accurate Basic Immediate Environment Questions: 25-49% accurate Commands: Impaired One Step Basic Commands: 50-74% accurate Two Step Basic Commands: 0-24% accurate Conversation: Simple Interfering Components: Attention;Working Curator: Not tested Reading Comprehension Reading Status: Not tested    Expression Expression Primary Mode of Expression: Verbal Verbal Expression Overall Verbal Expression: Impaired Initiation: No impairment Automatic Speech: Name Level of Generative/Spontaneous Verbalization: Phrase Repetition: Impaired Level of Impairment: Word level Naming: Impairment Confrontation: Impaired Verbal Errors: Language of confusion Pragmatics: Impairment Impairments: Eye contact;Abnormal affect Written Expression Written Expression: Not tested   Oral / Motor  Oral Motor/Sensory Function Overall Oral Motor/Sensory Function: (generalized weakness) Motor Speech Overall Motor Speech: Appears within functional limits for tasks assessed Respiration: Within functional limits Resonance: Within functional limits Articulation: Within functional limitis Intelligibility: Intelligible   GO                   Sirus Labrie B. [facility], CCC-SLP Speech Language Pathologist [phone]  Shonna Chock 03/20/2018, 10:31 AM

## 2018-03-20 NOTE — Progress Notes (Signed)
PT Cancellation Note  Patient Details Name: Nicole [name] MRN: [id number] DOB: 18-Jul-1931   Cancelled Treatment:    Reason Eval/Treat Not Completed: Active bedrest order. Please discontinue when appropriate to participate with PT evaluation.  Mabeline Caras, PT, DPT Acute Rehabilitation Services  Pager [phone] Office [place] 03/20/2018, 7:36 AM

## 2018-03-20 NOTE — Evaluation (Signed)
Clinical/Bedside Swallow Evaluation Patient Details  Name: Nicole [name] MRN: [id number] Date of Birth: 1931/05/04  Today's Date: 03/20/2018 Time: SLP Start Time (ACUTE ONLY): 1324 SLP Stop Time (ACUTE ONLY): 0935 SLP Time Calculation (min) (ACUTE ONLY): 30 min  Past Medical History:  Past Medical History:  Diagnosis Date  . AAA (abdominal aortic aneurysm) ([place])   . Anemia   . Anxiety   . Arthritis   . Atrial fibrillation ([place])   . Cervicalgia   . Cholelithiasis   . Chronic systolic heart failure ([place])   . CKD (chronic kidney disease)   . Colon cancer ([place]) 07/1997  . Depression   . Diabetes mellitus ([place])   . Dizziness and giddiness   . Electrolyte and fluid disorders not elsewhere classified   . GERD (gastroesophageal reflux disease)   . Gout, unspecified   . Hemorrhage of rectum and anus   . Hypercholesteremia   . Hypertension   . Hypoglycemia, unspecified   . Hypopotassemia   . Hypothyroidism   . Kidney stone    renal calculi  . Other chronic allergic conjunctivitis   . Other specified cardiac dysrhythmias(427.89)   . Pain in joint, lower leg   . Perforation of bile duct   . Proteinuria   . Shortness of breath   . Small bowel obstruction due to adhesions ([place]) 05/16/2012  . Stroke ([place])   . Swelling, mass, or lump in head and neck   . Thoracic aneurysm without mention of rupture    4.6 cm Asc Aortic aneurysm, CT 07/2015   Past Surgical History:  Past Surgical History:  Procedure Laterality Date  . CARDIAC CATHETERIZATION  2008   moderate severe pulm HTN; with elevted pulm capillary wedge pressure   . CHOLECYSTECTOMY  05/14/2012   Procedure: LAPAROSCOPIC CHOLECYSTECTOMY WITH INTRAOPERATIVE CHOLANGIOGRAM;  Surgeon: Haywood Lasso, MD;  Location: [facility];  Service: General;  Laterality: N/A;  . ERCP  05/17/2012   Procedure: ENDOSCOPIC RETROGRADE CHOLANGIOPANCREATOGRAPHY (ERCP);  Surgeon: Missy Sabins, MD;  Location: [facility];  Service: Gastroenterology;   Laterality: N/A;  . ERCP  08/12/2012   Procedure: ENDOSCOPIC RETROGRADE CHOLANGIOPANCREATOGRAPHY (ERCP);  Surgeon: Missy Sabins, MD;  Location: [facility] ENDOSCOPY;  Service: Endoscopy;  Laterality: N/A;  . HEMICOLECTOMY  08/11/1997  . IR THORACENTESIS ASP PLEURAL SPACE W/IMG GUIDE  02/10/2018  . IR THORACENTESIS ASP PLEURAL SPACE W/IMG GUIDE  02/26/2018  . IR THORACENTESIS ASP PLEURAL SPACE W/IMG GUIDE  03/19/2018  . KIDNEY STONE SURGERY    . TRANSTHORACIC ECHOCARDIOGRAM  11/2009   EF 45-50%, mild-mod AV regurg; mild MR; mod TR; ascending aorta mildly dilated   HPI:  84 year old female admitted 03/17/18 with worsening SOB. DC'd 3 weeks ago with similar dx. PMH: chronic hypoxic respiratory failure, 3LNC at home, dCHF, AFib/flutter, DM, dementia, pleural effusion, CKD, colon cancer, SBO, CVA, thoacic aneurysm without rupture.   Assessment / Plan / Recommendation Clinical Impression  Pt seen at bedside for assessment of swallow function and safety, as well as identification of least restrictive diet. No family at bedside to assist with prior status, however, pt has had prior swallow evaluations and is at increased risk for dysphagia and aspiration. Pt exhibits generalized weakness, and was unable to follow commands consistently for oral motor examination. Trials of ice chips, thin liquid, nectar thick liquid, puree, and solid were given. Pt exhibited slight throat clearing following each consistency, raising concern for airway compromise. Strong reflexive cough was noted after thin liquid via straw. Pt  appeared to tolerate pureed consistency and nectar thick liquid more appropriately than thin and solid trials, however, her risk for aspiration is high with any po intake. At this time, Palliative Care consult is recommended to facilitate establishment of appropriate goals of care, including completion of objective swallow evaluation (MBS), which may reveal even higher aspiration risk than is evident at bedside.    Will begin conservative diet with crushed meds, and follow for education as GOC are established. RN and MD informed of results and recommendations.   SLP Visit Diagnosis: Dysphagia, unspecified (R13.10)    Aspiration Risk  Moderate-Severe aspiration risk; Risk for inadequate nutrition/hydration    Diet Recommendation Dysphagia 1 (Puree);Nectar-thick liquid   Liquid Administration via: Straw Medication Administration: Crushed with puree Supervision: Full supervision/cueing for compensatory strategies Compensations: Minimize environmental distractions;Slow rate;Small sips/bites Postural Changes: Seated upright at 90 degrees;Remain upright for at least 30 minutes after po intake    Other  Recommendations Oral Care Recommendations: Oral care before and after PO Other Recommendations: Have oral suction available   Follow up Recommendations 24 hour supervision/assistance      Frequency and Duration min 1 x/week  2 weeks       Prognosis Prognosis for Safe Diet Advancement: Guarded Barriers to Reach Goals: Cognitive deficits      Swallow Study   General Date of Onset: 03/17/18 HPI: 84 year old female admitted 03/17/18 with worsening SOB. DC'd 3 weeks ago with similar dx. PMH: chronic hypoxic respiratory failure, 3LNC at home, dCHF, AFib/flutter, DM, dementia, pleural effusion, CKD, colon cancer, SBO, CVA, thoacic aneurysm without rupture. Type of Study: Bedside Swallow Evaluation Previous Swallow Assessment: 05/23/2017 - dys 2 (finely chopped), thin liquids Diet Prior to this Study: NPO Temperature Spikes Noted: No Respiratory Status: Nasal cannula History of Recent Intubation: No Behavior/Cognition: Alert;Cooperative;Pleasant mood;Confused Oral Cavity Assessment: Dry Oral Care Completed by SLP: No Oral Cavity - Dentition: Adequate natural dentition Vision: Functional for self-feeding Self-Feeding Abilities: Needs assist Patient Positioning: Upright in bed Baseline Vocal  Quality: Normal Volitional Cough: Cognitively unable to elicit Volitional Swallow: Unable to elicit    Oral/Motor/Sensory Function Overall Oral Motor/Sensory Function: (generalized weakness). Unable to fully assess, given pt inability to follow commands consistently.   Ice Chips Ice chips: Impaired Presentation: Spoon Pharyngeal Phase Impairments: Throat Clearing - Immediate    Thin Liquid Thin Liquid: Impaired Presentation: Cup;Straw Pharyngeal  Phase Impairments: (throat clearing following cup sips, cough after straw sips)    Nectar Thick Nectar Thick Liquid: Impaired Presentation: Straw;Cup Pharyngeal Phase Impairments: Throat Clearing - Immediate - inconsistent   Honey Thick Honey Thick Liquid: Not tested   Puree Puree: Impaired Presentation: Spoon Pharyngeal Phase Impairments: Throat Clearing - Immediate - inconsistent   Solid     Solid: Impaired Other Comments: pt reported sudden onset of pain after swallowing cracker     Celia B. Quentin Ore, [facility] [place] Speech Language Pathologist [phone]  Shonna Chock 03/20/2018,10:08 AM

## 2018-03-20 NOTE — Evaluation (Signed)
Occupational Therapy Evaluation Patient Details Name: Nicole [name] MRN: [id number] DOB: [birth year] Today's Date: 03/20/2018    History of Present Illness Pt is an 84 y.o. female admitted 03/17/18 with worsening SOB; worked up for CHF exacerbation. Rapid response called 8/28 due to unresponsiveness; suspected seizure activity. EEG consistent with encephaloapthy. CT 8/28 showed R superior thalamus acute hemorrhage, no associated mass effect; multiple chronic infarcts. Negative intracranial MRA for large vessel occlusion. PMH includes CHF, HTN, CKD, a-fib/flutter, DM, colon cancer, AAA, CVA, dementia.   Clinical Impression   Pt admitted with above dx and now presenting with cognitive deficits that impact her ability to follow directions functionally to participate in ADLs. Pt completed stand pivot transfer to recliner with mod A. Mod A provided for self feeding, with pt intermittently bringing spoon to mouth with set up. D/t cognitive deficits pt would benefit from continued skilled OT services and SNF placement upon d/c.     Follow Up Recommendations  SNF;Supervision/Assistance - 24 hour    Equipment Recommendations  Other (comment)(defer to next venue)    Recommendations for Other Services Speech consult;PT consult     Precautions / Restrictions Precautions Precautions: Fall Restrictions Weight Bearing Restrictions: No      Mobility Bed Mobility Overal bed mobility: Needs Assistance Bed Mobility: Supine to Sit;Sit to Supine     Supine to sit: Min assist Sit to supine: Min assist   General bed mobility comments: Pt not following commands for sitting EOB, requiring modA to assist BLEs and scoot hips to EOB. Once deciding she was ready to return to supine, able to do so without physical assist  Transfers Overall transfer level: Needs assistance Equipment used: None Transfers: Stand Pivot Transfers;Sit to/from Stand Sit to Stand: Mod assist Stand pivot transfers: Mod  assist       General transfer comment: Pt able to stand with 1 person HHA with mod A and then follow commands to stand pivot    Balance Overall balance assessment: Needs assistance Sitting-balance support: Feet supported;No upper extremity supported Sitting balance-Leahy Scale: Fair Sitting balance - Comments: Able to sit with minA progressing to min guard. Repeated attempts to return to supine requiring intermittent cues and assist to stay seated upright   Standing balance support: Bilateral upper extremity supported Standing balance-Leahy Scale: Poor                             ADL either performed or assessed with clinical judgement   ADL Overall ADL's : Needs assistance/impaired Eating/Feeding: Moderate assistance;Sitting;Cueing for sequencing Eating/Feeding Details (indicate cue type and reason): fluctuating between mod A to close (S) with self feeding                                 Functional mobility during ADLs: Moderate assistance       Vision Baseline Vision/History: (caregiver not present to determine baseline)       Perception     Praxis      Pertinent Vitals/Pain Pain Assessment: No/denies pain     Hand Dominance Right   Extremity/Trunk Assessment Upper Extremity Assessment Upper Extremity Assessment: Generalized weakness   Lower Extremity Assessment Lower Extremity Assessment: Defer to PT evaluation RLE Deficits / Details: Pt strongly resisting movement to bring legs to EOB, demonstrating likely at least 3/5 strength in BLEs; difficult to assess as pt not consistently following commands LLE Deficits /  Details: Pt strongly resisting movement to bring legs to EOB, demonstrating likely at least 3/5 strength in BLEs; difficult to assess as pt not consistently following commands   Cervical / Trunk Assessment Cervical / Trunk Assessment: Kyphotic   Communication Communication Communication: No difficulties   Cognition  Arousal/Alertness: Awake/alert Behavior During Therapy: Flat affect Overall Cognitive Status: History of cognitive impairments - at baseline Area of Impairment: Orientation;Attention;Memory;Following commands;Safety/judgement;Awareness;Problem solving                 Orientation Level: Disoriented to;Place;Situation;Time Current Attention Level: Sustained Memory: Decreased short-term memory Following Commands: Follows one step commands inconsistently Safety/Judgement: Decreased awareness of safety;Decreased awareness of deficits Awareness: Intellectual Problem Solving: Slow processing;Decreased initiation;Difficulty sequencing;Requires verbal cues;Requires tactile cues General Comments: Aide reports some memory and orientation deficits at baseline, but pt able to recognize most people and always "comes around eventually." Today, pt perseverating on various conversation points, stating "26" when asked age, then for birthday and year. Recognized daughter on facetime, but not able to state aide's name despite pt smiling when aide entered room   General Comments  Personal care attendant/aide present throughout session    Exercises     Shoulder Instructions      Home Living Family/patient expects to be discharged to:: Private residence Living Arrangements: Spouse/significant other Available Help at Discharge: Family;Personal care attendant;Available 24 hours/day Type of Home: House Home Access: Stairs to enter [employer] of Steps: 4 Entrance Stairs-Rails: Right;Left Home Layout: One level     Bathroom Shower/Tub: Tub/shower unit;Curtain   Bathroom Toilet: Handicapped height     Home Equipment: Environmental consultant - 2 wheels;Cane - single point;Shower seat   Additional Comments: Boyfriend [name]) lives with patient and there 24/7. Has personal care attendant daily, 7x/wk  Lives With: Significant other    Prior Functioning/Environment Level of Independence: Needs assistance   Gait / Transfers Assistance Needed: Pt poor historian. Aide/friend present and states pt ambulatory with or without RW; aide stands closed. Provides intermittent HHA, especially with stairs ADL's / Homemaking Assistance Needed: Aide reports pt indep with ADLs, including dressing and toileting. Aide assists with baths. Boyfriend cooks and cleans. pt able to self-feed   Comments: Has 24/7 supervision        OT Problem List: Cardiopulmonary status limiting activity;Decreased knowledge of use of DME or AE;Decreased cognition;Decreased coordination;Decreased safety awareness;Impaired balance (sitting and/or standing);Decreased activity tolerance      OT Treatment/Interventions: Self-care/ADL training;Therapeutic exercise;Energy conservation;Therapeutic activities;Cognitive remediation/compensation;Patient/family education;Balance training    OT Goals(Current goals can be found in the care plan section) Acute Rehab OT Goals Patient Stated Goal: none stated OT Goal Formulation: Patient unable to participate in goal setting Time For Goal Achievement: 03/31/18 Potential to Achieve Goals: Fair  OT Frequency: Min 2X/week   Barriers to D/C:            Co-evaluation              AM-PAC PT "6 Clicks" Daily Activity     Outcome Measure Help from another person eating meals?: A Lot Help from another person taking care of personal grooming?: A Lot Help from another person toileting, which includes using toliet, bedpan, or urinal?: A Lot Help from another person bathing (including washing, rinsing, drying)?: A Lot Help from another person to put on and taking off regular upper body clothing?: A Lot Help from another person to put on and taking off regular lower body clothing?: A Lot 6 Click Score: 12   End of Session Equipment  Utilized During Treatment: Oxygen Nurse Communication: Mobility status  Activity Tolerance: Patient tolerated treatment well Patient left: in chair;with call  bell/phone within reach  OT Visit Diagnosis: Unsteadiness on feet (R26.81);Muscle weakness (generalized) (M62.81);Other symptoms and signs involving cognitive function                Time: 1694-5038 OT Time Calculation (min): 48 min Charges:  OT General Charges $OT Visit: 1 Visit OT Evaluation $OT Eval Moderate Complexity: 1 Mod OT Treatments $Self Care/Home Management : 23-37 mins  Curtis Sites OTR/L 03/20/2018, 12:40 PM

## 2018-03-20 NOTE — Progress Notes (Addendum)
STROKE TEAM PROGRESS NOTE   INTERVAL HISTORY Her caregiver is at the bedside. States she is doing much better today but still more confused than at baseline. Pt awake, talkative, interactive. Is confused. Tolerating vimpat. No further sz.   Vitals:   03/20/18 0300 03/20/18 0318 03/20/18 0500 03/20/18 0733  BP: 115/74 (!) 161/68  130/75  Pulse: (!) 59 83  73  Resp: 18 20  17   Temp:  98.7 F (37.1 C)  98.1 F (36.7 C)  TempSrc:  Axillary  Oral  SpO2: 100% 100%  100%  Weight:   56.1 kg   Height:        CBC:  Recent Labs  Lab 03/17/18 1421 03/18/18 0243 03/20/18 0406  WBC 6.3 6.9 6.2  NEUTROABS 4.8  --  4.3  HGB 12.4 12.2 11.8*  HCT 36.6 35.9* 35.5*  MCV 81.7 79.1 80.5  PLT 219 219 646    Basic Metabolic Panel:  Recent Labs  Lab 03/19/18 0206 03/20/18 0406  NA 145 144  K 3.5 4.1  CL 101 101  CO2 34* 33*  GLUCOSE 103* 75  BUN 13 18  CREATININE 1.52* 2.15*  CALCIUM 9.1 9.1  MG 1.3*  --    Lipid Panel:     Component Value Date/Time   CHOL 224 (H) 03/19/2018 0206   CHOL 142 10/12/2015 0928   TRIG 77 03/19/2018 0206   HDL 53 03/19/2018 0206   HDL 47 10/12/2015 0928   CHOLHDL 4.2 03/19/2018 0206   VLDL 15 03/19/2018 0206   LDLCALC 156 (H) 03/19/2018 0206   LDLCALC 79 10/12/2015 0928   HgbA1c:  Lab Results  Component Value Date   HGBA1C 5.1 03/19/2018   Urine Drug Screen:     Component Value Date/Time   LABOPIA NONE DETECTED 07/20/2016 2021   COCAINSCRNUR NONE DETECTED 07/20/2016 2021   LABBENZ NONE DETECTED 07/20/2016 2021   AMPHETMU NONE DETECTED 07/20/2016 2021   THCU NONE DETECTED 07/20/2016 2021   LABBARB NONE DETECTED 07/20/2016 2021    Alcohol Level No results found for: ETH  IMAGING Dg Chest 1 View  Result Date: 03/19/2018 CLINICAL DATA:  84 year old female status post ultrasound-guided thoracentesis this morning. EXAM: CHEST  1 VIEW COMPARISON:  Portable chest 03/17/2018 and earlier. FINDINGS: Portable AP upright view at substantially  regressed veiling opacity at the right lung base with improved ventilation since yesterday. No pneumothorax identified. Stable smaller veiling opacity at the left lung base compatible with small left effusion. Cardiomegaly and mediastinal contours are stable. Calcified aortic atherosclerosis. No pulmonary edema or areas of worsening ventilation. IMPRESSION: 1. Improved right lung base ventilation and no pneumothorax following right side thoracentesis. 2. Stable small left pleural effusion. No new cardiopulmonary abnormality. Electronically Signed   By: Genevie Ann M.D.   On: 03/19/2018 09:54   Mr Jodene Nam Head Wo Contrast  Result Date: 03/19/2018 CLINICAL DATA:  Follow-up examination for acute stroke, intracranial hemorrhage. EXAM: MRI HEAD WITHOUT CONTRAST MRA HEAD WITHOUT CONTRAST TECHNIQUE: Multiplanar, multiecho pulse sequences of the brain and surrounding structures were obtained without intravenous contrast. Angiographic images of the head were obtained using MRA technique without contrast. COMPARISON:  Prior CT from earlier the same day as well as previous MRI from 05/23/2017. FINDINGS: MRI HEAD FINDINGS Brain: Moderately advanced cerebral atrophy with advanced chronic microvascular ischemic disease, similar to previous. Multiple scatter remote lacunar infarcts present within the bilateral basal ganglia and thalami. Chronic microvascular ischemic changes present within the pons. Probable small remote cortical infarcts involving  the left frontal and parietal cortices. Multiple foci of chronic hemosiderin staining seen about several of these infarcts, with additional foci seen scattered throughout the supratentorial and infratentorial brain. Changes could be hypertensive in nature or related to cerebral amyloid angiopathy or a combination there of. Focal susceptibility artifacts seen associated with the previously identified superior right thalamic hemorrhage. No significant mass effect or edema. No other evidence  for acute or subacute infarct. Gray-white matter differentiation otherwise maintained. No other evidence for acute intracranial hemorrhage. No mass lesion, midline shift or mass effect. Diffuse ventricular prominence related to global parenchymal volume loss of hydrocephalus. No extra-axial fluid collection. Pituitary gland normal. Vascular: Major intracranial vascular flow voids are maintained. Skull and upper cervical spine: Craniocervical junction normal. Multilevel degenerative spondylolysis noted within the upper cervical spine without significant stenosis. Bone marrow signal intensity normal. A scalp soft tissue abnormality. Sinuses/Orbits: Patient status post bilateral ocular lens replacement. Left sphenoid sinus retention cyst. Paranasal sinuses and mastoid air cells are otherwise clear. Inner ear structures grossly normal. Other: None. MRA HEAD FINDINGS ANTERIOR CIRCULATION: Distal cervical segments of the internal carotid arteries are patent with antegrade flow. Petrous segments patent bilaterally. Mild atheromatous irregularity within the cavernous/supraclinoid ICAs without hemodynamically significant stenosis. A1 segments patent bilaterally. Normal anterior communicating artery. Anterior cerebral arteries demonstrate mild atheromatous irregularity but are patent to their distal aspects without high-grade stenosis. M1 segments patent bilaterally without stenosis. No proximal M2 occlusion. Distal small vessel atheromatous irregularity involving the MCA branches bilaterally. POSTERIOR CIRCULATION: Vertebral arteries patent to the vertebrobasilar junction without stenosis. Partially visualized posterior inferior cerebral arteries patent bilaterally. Basilar patent to its distal aspect without stenosis. Superior cerebral arteries patent bilaterally. Predominant fetal type origin of the PCAs bilaterally supplied via widely patent posterior communicating arteries. P2 segments widely patent bilaterally. Distal  PCA branches not well seen on this exam, which may be related to high-grade stenoses and/or occlusion. No intracranial aneurysm. IMPRESSION: MRI HEAD IMPRESSION: 1. Stable 5 mm superior right thalamic intraparenchymal hemorrhage without mass effect. 2. No other acute intracranial abnormality identified. 3. Advanced atrophy with chronic small vessel ischemic disease with multiple chronic infarcts as above. 4. Innumerable chronic micro hemorrhages involving the supratentorial and infratentorial brain as above. Findings could reflect sequelae of chronic hypertensive encephalopathy and/or cerebral amyloid angiopathy (or a combination there of). MRA HEAD IMPRESSION: 1. Negative intracranial MRA for large vessel occlusion. No proximal high-grade or correctable stenosis identified. 2. Moderate distal small vessel atheromatous disease. Electronically Signed   By: Jeannine Boga M.D.   On: 03/19/2018 13:49   Mr Brain Wo Contrast  Result Date: 03/19/2018 CLINICAL DATA:  Follow-up examination for acute stroke, intracranial hemorrhage. EXAM: MRI HEAD WITHOUT CONTRAST MRA HEAD WITHOUT CONTRAST TECHNIQUE: Multiplanar, multiecho pulse sequences of the brain and surrounding structures were obtained without intravenous contrast. Angiographic images of the head were obtained using MRA technique without contrast. COMPARISON:  Prior CT from earlier the same day as well as previous MRI from 05/23/2017. FINDINGS: MRI HEAD FINDINGS Brain: Moderately advanced cerebral atrophy with advanced chronic microvascular ischemic disease, similar to previous. Multiple scatter remote lacunar infarcts present within the bilateral basal ganglia and thalami. Chronic microvascular ischemic changes present within the pons. Probable small remote cortical infarcts involving the left frontal and parietal cortices. Multiple foci of chronic hemosiderin staining seen about several of these infarcts, with additional foci seen scattered throughout the  supratentorial and infratentorial brain. Changes could be hypertensive in nature or related to cerebral amyloid angiopathy or a  combination there of. Focal susceptibility artifacts seen associated with the previously identified superior right thalamic hemorrhage. No significant mass effect or edema. No other evidence for acute or subacute infarct. Gray-white matter differentiation otherwise maintained. No other evidence for acute intracranial hemorrhage. No mass lesion, midline shift or mass effect. Diffuse ventricular prominence related to global parenchymal volume loss of hydrocephalus. No extra-axial fluid collection. Pituitary gland normal. Vascular: Major intracranial vascular flow voids are maintained. Skull and upper cervical spine: Craniocervical junction normal. Multilevel degenerative spondylolysis noted within the upper cervical spine without significant stenosis. Bone marrow signal intensity normal. A scalp soft tissue abnormality. Sinuses/Orbits: Patient status post bilateral ocular lens replacement. Left sphenoid sinus retention cyst. Paranasal sinuses and mastoid air cells are otherwise clear. Inner ear structures grossly normal. Other: None. MRA HEAD FINDINGS ANTERIOR CIRCULATION: Distal cervical segments of the internal carotid arteries are patent with antegrade flow. Petrous segments patent bilaterally. Mild atheromatous irregularity within the cavernous/supraclinoid ICAs without hemodynamically significant stenosis. A1 segments patent bilaterally. Normal anterior communicating artery. Anterior cerebral arteries demonstrate mild atheromatous irregularity but are patent to their distal aspects without high-grade stenosis. M1 segments patent bilaterally without stenosis. No proximal M2 occlusion. Distal small vessel atheromatous irregularity involving the MCA branches bilaterally. POSTERIOR CIRCULATION: Vertebral arteries patent to the vertebrobasilar junction without stenosis. Partially visualized  posterior inferior cerebral arteries patent bilaterally. Basilar patent to its distal aspect without stenosis. Superior cerebral arteries patent bilaterally. Predominant fetal type origin of the PCAs bilaterally supplied via widely patent posterior communicating arteries. P2 segments widely patent bilaterally. Distal PCA branches not well seen on this exam, which may be related to high-grade stenoses and/or occlusion. No intracranial aneurysm. IMPRESSION: MRI HEAD IMPRESSION: 1. Stable 5 mm superior right thalamic intraparenchymal hemorrhage without mass effect. 2. No other acute intracranial abnormality identified. 3. Advanced atrophy with chronic small vessel ischemic disease with multiple chronic infarcts as above. 4. Innumerable chronic micro hemorrhages involving the supratentorial and infratentorial brain as above. Findings could reflect sequelae of chronic hypertensive encephalopathy and/or cerebral amyloid angiopathy (or a combination there of). MRA HEAD IMPRESSION: 1. Negative intracranial MRA for large vessel occlusion. No proximal high-grade or correctable stenosis identified. 2. Moderate distal small vessel atheromatous disease. Electronically Signed   By: Jeannine Boga M.D.   On: 03/19/2018 13:49   Ct Head Code Stroke Wo Contrast  Result Date: 03/19/2018 CLINICAL DATA:  Code stroke.  84 y/o  F; nonverbal and unresponsive. EXAM: CT HEAD WITHOUT CONTRAST TECHNIQUE: Contiguous axial images were obtained from the base of the skull through the vertex without intravenous contrast. COMPARISON:  05/23/2017 MRI head.  06/29/2017 CT head. FINDINGS: Brain: 5 mm density within the right superior thalamus compatible with acute hemorrhage. No associated mass effect. No large stroke, extra-axial collection, hydrocephalus, or herniation. Advanced chronic microvascular ischemic changes and volume loss of the brain are stable from prior studies. Multiple chronic lacunar infarcts are present within the  bilateral basal ganglia. Vascular: Calcific atherosclerosis of the carotid siphons. No hyperdense vessel identified. Skull: Normal. Negative for fracture or focal lesion. Sinuses/Orbits: No acute finding. Other: None. ASPECTS [facility] Stroke Program Early CT Score) - Ganglionic level infarction (caudate, lentiform nuclei, internal capsule, insula, M1-M3 cortex): 7 - Supraganglionic infarction (M4-M6 cortex): 3 Total score (0-10 with 10 being normal): 10 IMPRESSION: 1. 5 mm right superior thalamus acute hemorrhage. No associated mass effect. 2. No additional acute intracranial abnormality identified. 3. ASPECTS is 10. 4. Stable advanced chronic microvascular ischemic changes and volume loss of the  brain as well as multiple small basal ganglia chronic infarcts. These results were called by telephone at the time of interpretation on 03/19/2018 at 1:41 am to Dr. Rory Percy , who verbally acknowledged these results. Electronically Signed   By: Kristine Garbe M.D.   On: 03/19/2018 01:44   Ir Thoracentesis Asp Pleural Space W/img Guide  Result Date: 03/19/2018 INDICATION: Congestive heart failure. Right pleural effusion. Request for therapeutic thoracentesis. EXAM: ULTRASOUND GUIDED RIGHT THORACENTESIS MEDICATIONS: 2% lidocaine 10 mL COMPLICATIONS: None immediate. PROCEDURE: An ultrasound guided thoracentesis was thoroughly discussed with the patient and questions answered. The benefits, risks, alternatives and complications were also discussed. The patient understands and wishes to proceed with the procedure. Written consent was obtained. Ultrasound was performed to localize and mark an adequate pocket of fluid in the right chest. The area was then prepped and draped in the normal sterile fashion. 1% Lidocaine was used for local anesthesia. Under ultrasound guidance a 19 gauge, 7-cm, Yueh catheter was introduced. Thoracentesis was performed. The catheter was removed and a dressing applied. FINDINGS: A total of  approximately 1.3 L of bloody fluid was removed. IMPRESSION: Successful ultrasound guided right thoracentesis yielding 1.3 L of pleural fluid. No pneumothorax on post-procedure chest x-ray. Read by: Gareth Eagle, PA-C Electronically Signed   By: Marybelle Killings M.D.   On: 03/19/2018 10:40   2D Echocardiogram  - Left ventricle: The cavity size was normal. Wall thickness was increased in a pattern of moderate LVH. Systolic function was normal. The estimated ejection fraction was in the range of 60% to 65%. Wall motion was normal; there were no regional wall motion abnormalities. The study was not technically sufficient to allow evaluation of LV diastolic dysfunction due to atrial fibrillation. - Aortic valve: Trileaflet; mildly calcified leaflets. There was no stenosis. There was moderate regurgitation. - Aorta: Mildly dilated aortic root and ascending aorta. Ascending aorta dimension: 40 mm. Aortic root dimension: 37 mm (ED). - Mitral valve: Mildly calcified annulus. Mildly calcified leaflets. There was mild regurgitation. - Left atrium: The atrium was moderately dilated. - Right ventricle: The cavity size was mildly to moderately dilated. Systolic function was normal. - Right atrium: The atrium was severely dilated. - Tricuspid valve: Peak RV-RA gradient (S): 54 mm Hg. - Pulmonary arteries: PA peak pressure: 62 mm Hg (S). - Systemic veins: IVC measured 1.9 cm with < 50% respirophasic variation, suggesting RA pressure 8 mmHg. Impressions:   The patient was in atrial fibrillation. Normal LV size with moderate LV hypertrophy. EF 60-65%. Mild to moderate RV dilation with normal systolic function. Mild MR, moderate aortic insufficiency. Moderate pulmonary hypertension. Biatrial enlargement.  EEG This is an abnormal EEG secondary to general background slowing.  This finding may be seen with a diffuse disturbance that is etiologically nonspecific, but may include a metabolic encephalopathy or post-ictal state,  among other possibilities.  There was a short period when some triphasic wave activity was noted as well which again is consistent with encephalopathy.  Clinical correlation recommended.     PHYSICAL EXAM General: AA female awake and alert, In no apparent distress HEENT: Normocephalic atraumatic CVS: O2-H4 heard, irregular irregular rate rhythm Extremities: No pedal edema  Neurological exam Awake, alert. Will follow simple commands and mimics without difficulty. Knows self, but not time, place or situation. Can name objects and count fingers. No dysarthria or aphasia noted.  Pupils equal and reactive. EOMI. Visual fields full. no gaze deviation or preference, no facial asymmetry. Motor exam: voluntary MAE x 4. purposeful  movement. No obvious weakness. Difficult to assess actual strength d/t lack of cooperation.   ASSESSMENT/PLAN [name] is a 84 y.o. female with history of AAA, DB, depression, HLD, HTN, ICH while on AC for PE, AF, significant dementia presenting with chronic hypoxic respiratory failure d/t CHF exacerbation. During hospitalization, found to be unresponsive to voice or noxious stimuli. Not on AC. CT showed small R thalamic ICH. Had witnessed tonic-clonic seizure the following morning.   Seizure  Likely unwitnessed seziure led to unresponsive statue during 03/18/18 night  03/19/18 am with witnessed tonic clonic activity x 30 secs  EEG no sz, background slowing. Some triphasic activity noted  NH4 normal  Loaded with Vimpat followed by scheduled dose  Postictal state resolving, though remains confused  Stroke:  Subacute small right thalamic ICH, likely combination of hypertension and cerebral amyloid angiopathy  Code Stroke CT head small R thalamic ICH. No mass effect. Mult old B BG infarcts. Small vessel disease. Atrophy. ASPECTS 10.     MRI  Stable small R thalamic IPH.  No acute infarct. Small vessel disease. Atrophy. Innumerable bilateral micro  hemorrhages.   MRA  Distal small vessel atheromatous disease   2D Echo  EF 60-65%. PT in AF. RA dilated.  LDL 156  HgbA1c 5.1  SCDs for VTE prophylaxis  D1 nectar thick liquids  aspirin 81 mg daily prior to admission, now on No antithrombotic due to ICH/hypertensive hemorrhages  Therapy recommendations:  SNF   Disposition:  pending  (has [facility] and full caregiver support at home)  Atrial Fibrillation  Home anticoagulation:  none  . Not an [facility] candidate given multi microvascular hemorrhages seen on MRI . Antithrombotics not recommended  Hypertension  Elevated on arrival  Treated with labetalol  Stable now . BP goal < 160 with hemorrhage  Hyperlipidemia  Home meds:  No statin  LDL 156, goal < 70  Consider statin at discharge  Diabetes type II  HgbA1c 5.1  Controlled  Other Stroke Risk Factors  Advanced age  Hx PE 07/2016 treated w/ Eliquis  Hx ICH  12/2016 - bilateral ICH with right thalamus acute and left frontal subacute, likely due to high BP and small vessel disease in the setting of eliquis for PE treatment  03/2015 - Right caudate head with ventricular extension likely secondary to HTN and warfarin coagulopathy (INR 3.6 on admission)  Family hx stroke (father)  Chronic systolic Congestive heart failure w/ acute exacerbation  AAA, thoracic aneurysm 4.6cm  Other Active Problems  Baseline severe dementia  CKD  Hospital day # [place], MSN, APRN, ANVP-BC, AGPCNP-BC Advanced Practice Stroke Nurse [place] [facility] for Schedule & Pager information 03/20/2018 8:28 AM    ATTENDING NOTE: I reviewed above note and agree with the assessment and plan. I have made any additions or clarifications directly to the above note. Pt was seen and examined.   84 year old female with history of AAA, anemia, A. fib on aspirin, CHF, CKD, dementia, diabetes, hypertension, hyperlipidemia, remote colon cancer admitted for CHF exacerbation  and respiratory distress.  She had a remote history of right caudate head ICH and IVH in 03/2015, at that time she was on Coumadin, INR 3.06, EF 55 to 60%.  LDL 97 and A1c 5.9.  She was put on aspirin after blood absorbed in the follow-up with Dr. Leonie Man at [facility]. In 07/2016 patient had a PE, therefore she was put on Eliquis.  However, 12/2016 she was admitted for bilateral  ICH at right thalamus and left frontal lobe.  LDL 109 and A1c 6.9.  Her Eliquis discontinued and was later on put back on aspirin.  She was admitted this time for CHF exacerbation and respiratory distress.  However, 03/18/18 night, she had AMS, able to open eyes, but not able to speak or follow commands.  CT showed right thalamic tiny hemorrhage.  An MRI confirmed right thalamic tiny hemorrhage, but also showed numerous micro-bleeds at cortical and subcortical regions, likely combination of CAA and hypertension.  MRA negative.  EF 60 to 65%.  A1c 5.1, LDL 156, THS 6.5 and ammonia level normal.   Around 7 AM 03/19/18, patient had witnessed tonic-clonic seizure lasting 30 seconds.  EEG in pm showed slow background with intermittent triphasic waves, no seizure. Pt had postictal state in pm. Patient condition concerning for recurrent seizure activity the other night and in the morning with later postictal state. Loaded with Vimpat 150 mg and follow with Vimpat 75 mg twice daily. Today, she is more awake alert, orientated to self, but not to age, time, place. Perseveration on answering questions. No focal neuro deficit, moving all extremities symmetrically.   Patient right thalamic tiny bleed likely incidental finding, but given numerous micro-bleeds with CAA and hypertensive combination picture, recommend to avoid antiplatelet or anticoagulation in the future.   Neurology will sign off. Please call with questions. Pt will follow up with Dr. Leonie Man at [facility] in about 6 weeks. Thanks for the consult.   Rosalin Hawking, MD PhD Stroke  Neurology 03/20/2018 4:51 PM    To contact Stroke Continuity provider, please refer to [email or web address] After hours, contact General Neurology

## 2018-03-20 NOTE — Evaluation (Signed)
Physical Therapy Evaluation Patient Details Name: Nicole [name] MRN: [id number] DOB: May 13, 1931 Today's Date: 03/20/2018   History of Present Illness  Pt is an 84 y.o. female admitted 03/17/18 with worsening SOB; worked up for CHF exacerbation. Rapid response called 8/28 due to unresponsiveness; suspected seizure activity. EEG consistent with encephaloapthy. CT 8/28 showed R superior thalamus acute hemorrhage, no associated mass effect; multiple chronic infarcts. Negative intracranial MRA for large vessel occlusion. PMH includes CHF, HTN, CKD, a-fib/flutter, DM, colon cancer, AAA, CVA, dementia.    Clinical Impression  Pt presents with an overall decrease in functional mobility secondary to above. PTA, pt ambulatory and performing ADLs with 24/7 supervision from family/PCA. Today, pt poor historian; personal care attendant present and able to provide PLOF details. PCA reports that pt has baseline memory impairments, but not as severe as she presents now. Pt demonstrating impaired attention, command following, memory, and slowed processing. Demonstrates functional strength in BUEs/BLEs, but difficult to determine asymmetry due to lack of consistent command following. Feel pt would benefit from intensive CIR-level therapies to maximize functional mobility and return to PLOF with good support system at home. Will follow acutely to address established goals.    Follow Up Recommendations CIR;Supervision/Assistance - 24 hour    Equipment Recommendations  None recommended by PT    Recommendations for Other Services       Precautions / Restrictions Precautions Precautions: Fall Restrictions Weight Bearing Restrictions: No      Mobility  Bed Mobility Overal bed mobility: Needs Assistance Bed Mobility: Supine to Sit;Sit to Supine     Supine to sit: Mod assist Sit to supine: Min guard   General bed mobility comments: Pt not following commands for sitting EOB, requiring modA to assist BLEs  and scoot hips to EOB. Once deciding she was ready to return to supine, able to do so without physical assist  Transfers                 General transfer comment: Able to sit EOB with min guard. Pt confused and adamantly declining attempting to stand despite max encouragement. Feel pt would have been able to with +1-2 assist  Ambulation/Gait                Stairs            Wheelchair Mobility    Modified Rankin (Stroke Patients Only)       Balance Overall balance assessment: Needs assistance Sitting-balance support: Feet supported;No upper extremity supported Sitting balance-Leahy Scale: Fair Sitting balance - Comments: Able to sit with minA progressing to min guard. Repeated attempts to return to supine requiring intermittent cues and assist to stay seated upright                                     Pertinent Vitals/Pain Pain Assessment: No/denies pain    Home Living Family/patient expects to be discharged to:: Private residence Living Arrangements: Spouse/significant other Available Help at Discharge: Family;Personal care attendant;Available 24 hours/day Type of Home: House Home Access: Stairs to enter Entrance Stairs-Rails: Psychiatric nurse of Steps: 4 Home Layout: One level Home Equipment: Walker - 2 wheels;Cane - single point;Shower seat Additional Comments: Boyfriend Nicole [name]) lives with patient and there 24/7. Has personal care attendant daily, 7x/wk    Prior Function Level of Independence: Needs assistance   Gait / Transfers Assistance Needed: Pt poor historian. Aide/friend present and states pt  ambulatory with or without RW; aide stands closed. Provides intermittent HHA, especially with stairs  ADL's / Homemaking Assistance Needed: Aide reports pt indep with ADLs, including dressing and toileting. Aide assists with baths. Boyfriend cooks and cleans. pt able to self-feed  Comments: Has 24/7 supervision      Hand Dominance        Extremity/Trunk Assessment   Upper Extremity Assessment Upper Extremity Assessment: Generalized weakness;Difficult to assess due to impaired cognition(Grip strength at least 3/5 and seemed symmetrical, difficult to assess as pt not consistently following commands)    Lower Extremity Assessment Lower Extremity Assessment: Difficult to assess due to impaired cognition;LLE deficits/detail;RLE deficits/detail;Generalized weakness RLE Deficits / Details: Pt strongly resisting movement to bring legs to EOB, demonstrating likely at least 3/5 strength in BLEs; difficult to assess as pt not consistently following commands LLE Deficits / Details: Pt strongly resisting movement to bring legs to EOB, demonstrating likely at least 3/5 strength in BLEs; difficult to assess as pt not consistently following commands    Cervical / Trunk Assessment Cervical / Trunk Assessment: Kyphotic  Communication      Cognition Arousal/Alertness: Awake/alert Behavior During Therapy: Flat affect Overall Cognitive Status: History of cognitive impairments - at baseline Area of Impairment: Orientation;Attention;Memory;Following commands;Safety/judgement;Awareness;Problem solving                 Orientation Level: Disoriented to;Place;Situation;Time Current Attention Level: Sustained Memory: Decreased short-term memory Following Commands: Follows one step commands inconsistently Safety/Judgement: Decreased awareness of safety;Decreased awareness of deficits Awareness: Intellectual Problem Solving: Slow processing;Decreased initiation;Difficulty sequencing;Requires verbal cues;Requires tactile cues General Comments: Aide reports some memory and orientation deficits at baseline, but pt able to recognize most people and always "comes around eventually." Today, pt perseverating on various conversation points, stating "26" when asked age, then for birthday and year. Recognized daughter on  facetime, but not able to state aide's name despite pt smiling when aide entered room      General Comments General comments (skin integrity, edema, etc.): Personal care attendant/aide present throughout session    Exercises     Assessment/Plan    PT Assessment Patient needs continued PT services  PT Problem List Decreased strength;Decreased mobility;Decreased safety awareness;Decreased activity tolerance;Decreased cognition;Decreased balance;Decreased knowledge of use of DME       PT Treatment Interventions DME instruction;Gait training;Stair training;Functional mobility training;Therapeutic activities;Therapeutic exercise;Balance training;Neuromuscular re-education;Cognitive remediation;Patient/family education    PT Goals (Current goals can be found in the Care Plan section)       Frequency Min 4X/week   Barriers to discharge        Co-evaluation               AM-PAC PT "6 Clicks" Daily Activity  Outcome Measure Difficulty turning over in bed (including adjusting bedclothes, sheets and blankets)?: Unable Difficulty moving from lying on back to sitting on the side of the bed? : Unable Difficulty sitting down on and standing up from a chair with arms (e.g., wheelchair, bedside commode, etc,.)?: Unable Help needed moving to and from a bed to chair (including a wheelchair)?: A Little Help needed walking in hospital room?: A Lot Help needed climbing 3-5 steps with a railing? : Total 6 Click Score: 9    End of Session   Activity Tolerance: Patient limited by fatigue Patient left: in bed;with call bell/phone within reach;with bed alarm set;with family/visitor present Nurse Communication: Mobility status PT Visit Diagnosis: Other abnormalities of gait and mobility (R26.89)    Time: 1000-1024 PT Time Calculation (  min) (ACUTE ONLY): 24 min   Charges:   PT Evaluation $PT Eval Moderate Complexity: 1 Mod PT Treatments $Therapeutic Activity: 8-22 mins       Mabeline Caras, PT, DPT Acute Rehabilitation Services  Pager [phone] Office [place] 03/20/2018, 10:56 AM

## 2018-03-20 NOTE — Progress Notes (Signed)
PROGRESS NOTE    Nicole [name]  WYO:[id number] DOB: August 14, 1930 DOA: 03/17/2018 PCP: [name], DO    Brief Narrative:  84 year old female who presented with dyspnea.  She does have significant past medical history for chronic hypoxic respiratory failure, (3 LPM), diastolic heart failure, chronic atrial fibrillation, type 2 diabetes mellitus, and dementia.  Reported 3 days of worsening dyspnea, associated with lower extremity edema, no chest pain or palpitations.  On initial physical examination blood pressure 206/140, heart rate 137, respiratory 25-33, temperature 98.3, oxygen saturation 98%.  Heart S1-S2 present, tachycardic, irregularly irregular, lungs with decreased breath sounds, predominantly on the right base, abdomen soft nontender, trace lower extremity edema.  Patient was oriented to self and place.  Her chest x-ray had a right pleural effusion.  EKG had atrial fibrillation, normal axis, right bundle branch block.  Patient was admitted to the hospital with working diagnosis of acute on chronic diastolic heart failure complicated by acute on chronic hypoxic respiratory failure, atrial fibrillation with rapid ventricular response and right pleural effusion.   08/28.  New ischemic/hemorrhagic CVA (small thalamic bleed), complicated with seizures.    Assessment & Plan:   Principal Problem:   Acute respiratory distress Active Problems:   Anemia   Acute on chronic systolic heart failure (HCC)   Essential hypertension   Bradyarrhythmia   DNR (do not resuscitate)   Diabetes mellitus ([place])   Anxiety   CAD (coronary artery disease)   Pleural effusion   Intracerebral hemorrhage   Protein-calorie malnutrition, severe  1. Acute CVA complicated with seizures and acute hypoxic respiratory heart failure. This am patient is more awake and alert, following commands but still disorientated. Continue neuro checks and aspiration precautions. Diet advanced per speech therapy. Continue  lacosamide IV and follow neurology recommendations. EEG with no active seizures.   2. Acute on chronic diastolic heart failure exacerbation with bilateral pleural effusions, more right than left. Improved symptoms after thoracentesis, 1.3 Liters removed, of bloody fluid. Clinically patient hypovolemic, will hold on further diuresis. Continue metoprolol, will change to tartrate due to swallow dysfunction. Continue isosorbide.   3. Chronic atrial fibrillation. Continue rate control with diltiazem and metoprolol (changed from succinate to tartrate). Continue telemetry monitoring, no anticoagulation due to intracranial bleed.   4. HTN. Blood pressure 774 to 128 systolic, continue to hold on diuresis.   5. T2DM. Glucose cover and monitoring with insulin sliding scale, capillary glucose 76. 71, 109, 90.  Fasting glucose 75.   6. Hypothyroid. Continue levothyroxine.   7. Dementia. Continue donepezil and sertraline.   DVT prophylaxis: scd   Code Status: dnr  Family Communication: no family at the bedside   Disposition Plan/ discharge barriers: patient with acute deterioration, pending clinical improvement    Consultants:   Neurology   Procedures:     Antimicrobials:       Subjective: Patient more awake and reactive today, continue to be confused and disorientated.   Objective: Vitals:   03/20/18 0300 03/20/18 0318 03/20/18 0500 03/20/18 0733  BP: 115/74 (!) 161/68  130/75  Pulse: (!) 59 83  73  Resp: 18 20  17   Temp:  98.7 F (37.1 C)  98.1 F (36.7 C)  TempSrc:  Axillary  Oral  SpO2: 100% 100%  100%  Weight:   56.1 kg   Height:        Intake/Output Summary (Last 24 hours) at 03/20/2018 0816 Last data filed at 03/20/2018 0600 Gross per 24 hour  Intake 133.5 ml  Output 2000 ml  Net -1866.5 ml   Filed Weights   03/19/18 1249 03/19/18 2324 03/20/18 0500  Weight: 51.6 kg 56.1 kg 56.1 kg    Examination:   General: deconditioned and ill looking appearing    Neurology: Awake and alert, non focal, disorientated  E ENT: mild pallor, no icterus, oral mucosa moist Cardiovascular: No JVD. S1-S2 present, rhythmic, no gallops, rubs, or murmurs. No lower extremity edema. Pulmonary: positive breath sounds bilaterally, decreased air movement at the right base, no wheezing, rhonchi or rales. Gastrointestinal. Abdomen with no organomegaly, non tender, no rebound or guarding Skin. No rashes Musculoskeletal: no joint deformities     Data Reviewed: I have personally reviewed following labs and imaging studies  CBC: Recent Labs  Lab 03/17/18 1421 03/18/18 0243 03/20/18 0406  WBC 6.3 6.9 6.2  NEUTROABS 4.8  --  4.3  HGB 12.4 12.2 11.8*  HCT 36.6 35.9* 35.5*  MCV 81.7 79.1 80.5  PLT 219 219 599   Basic Metabolic Panel: Recent Labs  Lab 03/17/18 1421 03/18/18 0243 03/19/18 0206 03/20/18 0406  NA 146* 145 145 144  K 3.2* 3.2* 3.5 4.1  CL 101 101 101 101  CO2 36* 34* 34* 33*  GLUCOSE 134* 116* 103* 75  BUN 10 12 13 18   CREATININE 1.12* 1.00 1.52* 2.15*  CALCIUM 9.5 9.1 9.1 9.1  MG  --   --  1.3*  --    GFR: Estimated Creatinine Clearance: 17.3 mL/min (A) (by C-G formula based on SCr of 2.15 mg/dL (H)). Liver Function Tests: Recent Labs  Lab 03/19/18 0206  AST 19  ALT 7  ALKPHOS 80  BILITOT 0.8  PROT 6.7  ALBUMIN 2.8*   No results for input(s): LIPASE, AMYLASE in the last 168 hours. Recent Labs  Lab 03/19/18 0206  AMMONIA 17   Coagulation Profile: No results for input(s): INR, PROTIME in the last 168 hours. Cardiac Enzymes: No results for input(s): CKTOTAL, CKMB, CKMBINDEX, TROPONINI in the last 168 hours. BNP (last 3 results) No results for input(s): PROBNP in the last 8760 hours. HbA1C: Recent Labs    03/19/18 0206  HGBA1C 5.1   CBG: Recent Labs  Lab 03/19/18 0100 03/19/18 1853  GLUCAP 109* 90   Lipid Profile: Recent Labs    03/19/18 0206  CHOL 224*  HDL 53  LDLCALC 156*  TRIG 77  CHOLHDL 4.2    Thyroid Function Tests: Recent Labs    03/19/18 0209  TSH 6.512*   Anemia Panel: No results for input(s): VITAMINB12, FOLATE, FERRITIN, TIBC, IRON, RETICCTPCT in the last 72 hours.    Radiology Studies: I have reviewed all of the imaging during this hospital visit personally     Scheduled Meds: . allopurinol  100 mg Oral Daily  . diltiazem  30 mg Oral BID  . donepezil  10 mg Oral QHS  . ferrous sulfate  325 mg Oral BID WC  . isosorbide mononitrate  30 mg Oral BID  . levothyroxine  50 mcg Oral QAC breakfast  . metoprolol succinate  25 mg Oral Daily  . senna-docusate  1 tablet Oral BID  . sertraline  50 mg Oral Daily  . sodium chloride flush  3 mL Intravenous Q12H   Continuous Infusions: . sodium chloride    . famotidine (PEPCID) IV 20 mg (03/19/18 1005)  . lacosamide (VIMPAT) IV 75 mg (03/19/18 2214)     LOS: 3 days        Feliz Herard Gerome Apley, MD [facility]  Pager [phone]

## 2018-03-20 NOTE — Progress Notes (Signed)
Rehab Admissions Coordinator Note:  Patient was screened by Cleatrice Burke for appropriateness for an Inpatient Acute Rehab Consult per PT recommendation. Noted dementia with worsening of cognition. I would like to follow pt's progress before determining rehab venue options. Noted caregiver support. I will follow.Cleatrice Burke 03/20/2018, 11:05 AM  I can be reached at [phone].

## 2018-03-21 LAB — BASIC METABOLIC PANEL
Anion gap: 9 (ref 5–15)
BUN: 29 mg/dL — ABNORMAL HIGH (ref 8–23)
CO2: 34 mmol/L — AB (ref 22–32)
CREATININE: 2.22 mg/dL — AB (ref 0.44–1.00)
Calcium: 9.3 mg/dL (ref 8.9–10.3)
Chloride: 102 mmol/L (ref 98–111)
GFR calc non Af Amer: 19 mL/min — ABNORMAL LOW (ref >60)
GFR, EST AFRICAN AMERICAN: 22 mL/min — AB (ref >60)
GLUCOSE: 123 mg/dL — AB (ref 70–99)
Potassium: 4.7 mmol/L (ref 3.5–5.1)
Sodium: 145 mmol/L (ref 135–145)

## 2018-03-21 NOTE — Progress Notes (Signed)
Physical Therapy Treatment Patient Details Name: Nicole [name] MRN: [id number] DOB: Apr 25, 1931 Today's Date: 03/21/2018    History of Present Illness Pt is an 84 y.o. female admitted 03/17/18 with worsening SOB; worked up for CHF exacerbation. Rapid response called 8/28 due to unresponsiveness; suspected seizure activity. EEG consistent with encephaloapthy. CT 8/28 showed R superior thalamus acute hemorrhage, no associated mass effect; multiple chronic infarcts. Negative intracranial MRA for large vessel occlusion. PMH includes CHF, HTN, CKD, a-fib/flutter, DM, colon cancer, AAA, CVA, dementia.    PT Comments    Pt just had bath and willing to get up to chair with therapy, however is not willing to progress gait further today. Pt currently requires minA for bed mobility, and modA for stand pivot transfer to recliner. Pt is limited in mobility by decreased safety awareness, decreased command following and generalized weakness. PT will continue to follow until d/c.     Follow Up Recommendations  SNF     Equipment Recommendations  Other (comment)(TBD at next venue)    Recommendations for Other Services       Precautions / Restrictions Precautions Precautions: Fall Restrictions Weight Bearing Restrictions: No    Mobility  Bed Mobility Overal bed mobility: Needs Assistance Bed Mobility: Supine to Sit;Sit to Supine     Supine to sit: Min assist Sit to supine: Min assist   General bed mobility comments: pt requires maximal verbal and tactile cuing to come to seated EoB  Transfers Overall transfer level: Needs assistance Equipment used: None Transfers: Stand Pivot Transfers;Sit to/from Stand Sit to Stand: Mod assist Stand pivot transfers: Mod assist       General transfer comment: Pt able to stand with 1 person HHA with mod A and then follow commands to stand pivot         Balance Overall balance assessment: Needs assistance Sitting-balance support: Feet  supported;No upper extremity supported Sitting balance-Leahy Scale: Fair     Standing balance support: Bilateral upper extremity supported Standing balance-Leahy Scale: Poor                              Cognition   Behavior During Therapy: Flat affect Overall Cognitive Status: History of cognitive impairments - at baseline Area of Impairment: Orientation;Attention;Memory;Following commands;Safety/judgement;Awareness;Problem solving                 Orientation Level: Disoriented to;Place;Situation;Time Current Attention Level: Sustained Memory: Decreased short-term memory Following Commands: Follows one step commands inconsistently Safety/Judgement: Decreased awareness of safety;Decreased awareness of deficits Awareness: Intellectual Problem Solving: Slow processing;Decreased initiation;Difficulty sequencing;Requires verbal cues;Requires tactile cues General Comments: pt only oriented to self      Exercises      General Comments General comments (skin integrity, edema, etc.): VSS      Pertinent Vitals/Pain Pain Assessment: No/denies pain           PT Goals (current goals can now be found in the care plan section) Acute Rehab PT Goals Patient Stated Goal: none stated PT Goal Formulation: Patient unable to participate in goal setting Time For Goal Achievement: 03/13/18 Potential to Achieve Goals: Fair Progress towards PT goals: Progressing toward goals    Frequency    Min 2X/week      PT Plan Discharge plan needs to be updated       AM-PAC PT "6 Clicks" Daily Activity  Outcome Measure  Difficulty turning over in bed (including adjusting bedclothes, sheets and blankets)?: Unable Difficulty  moving from lying on back to sitting on the side of the bed? : Unable Difficulty sitting down on and standing up from a chair with arms (e.g., wheelchair, bedside commode, etc,.)?: Unable Help needed moving to and from a bed to chair (including a  wheelchair)?: Total Help needed walking in hospital room?: Total Help needed climbing 3-5 steps with a railing? : Total 6 Click Score: 6    End of Session Equipment Utilized During Treatment: Gait belt Activity Tolerance: Patient limited by fatigue Patient left: in chair;with call bell/phone within reach;with chair alarm set Nurse Communication: Mobility status PT Visit Diagnosis: Other abnormalities of gait and mobility (R26.89)     Time: 1112-1130 PT Time Calculation (min) (ACUTE ONLY): 18 min  Charges:  $Therapeutic Activity: 8-22 mins                     Boyde Grieco B. Migdalia Dk PT, DPT Acute Rehabilitation Services Pager [phone] Office [phone]    [place] 03/21/2018, 11:48 AM

## 2018-03-21 NOTE — Progress Notes (Signed)
PROGRESS NOTE [facility]   [name]   DJM:[id number] DOB: 1931-06-09  DOA: 03/17/2018 PCP: [name], DO   Brief Narrative:  Nicole [name] is a 84 year old female who presented with dyspnea. She does have significant past medical history for chronic hypoxic respiratory failure, (3 LPM),diastolic heart failure, chronic atrial fibrillation, type 2 diabetes mellitus, and dementia. Reported 3 days of worsening dyspnea, associated with lower extremity edema, no chest pain or palpitations. On initial physical examination blood pressure 206/140, heart rate 137, respiratory 25-33, temperature 98.3, oxygen saturation 98%. Heart S1-S2 present, tachycardic, irregularly irregular, lungs with decreased breath sounds, predominantly on the right base, abdomen soft nontender, trace lower extremity edema. Patient was oriented to self and place. Her chest x-ray had a right pleural effusion. EKG had atrial fibrillation, normal axis, right bundle branch block.  Patient was admitted to the hospital with working diagnosis of acute on chronic diastolic heart failure complicated by acute on chronic hypoxic respiratory failure,atrial fibrillation with rapid ventricular response and right pleural effusion.   08/28.New ischemic/hemorrhagic CVA (small thalamic bleed), complicated with seizures.   Subjective: Patient seen and examined, per care giver she is more sluggish today. However she is happy because is her birthday. Patient was agitated overnight and try to pull IV lines and telemetry monitor.   Assessment & Plan:   Principal Problem:   Acute respiratory distress Active Problems:   Anemia   Acute on chronic systolic heart failure (HCC)   Essential hypertension   Bradyarrhythmia   DNR (do not resuscitate)   Diabetes mellitus ([place])   Anxiety   CAD (coronary artery disease)   Pleural effusion   Intracerebral hemorrhage   Protein-calorie malnutrition, severe  #. Acute  CVA complicated with seizures and acute hypoxic respiratory heart failure.  This morning slight sluggish, she received pain medications overnight, may be contributing to lethargy.  Patient tolerating diet well.  Continue Vimpat.  EEG with no seizure activity.  Postictal state resolving however remain slight confused.  Neurology has signed off, awaiting SNF.  #. Acute on chronicdiastolicheart failure exacerbation with bilateral pleural effusion Improved symptoms after thoracentesis, 1.3 Liters removed, of bloody fluid.  Pain seems to be dry on my exam.  Creatinine slight trending up.  Continue to hold diuresis, continue metoprolol and isosorbide.  Monitor on I&O's and daily weights.  #Acute on chronic CKD stage III Likely due to aggressive diuresis, holding diuretic.  Avoid nephrotoxic agents and hypotension.  Encourage oral hydration.  Monitor urine output.  #. Chronic atrial fibrillation.  Heart rate well controlled, on diltiazem and metoprolol.  Not on anticoagulation due to intracranial bleed.  Continue to monitor.  #. HTN.  BP slight above goal, will continue to monitor for now, HR in the low side.  May need different antiarrhythmic medication.  For now continue diltiazem and metoprolol.  If BP remains elevated consider amlodipine or hydralazine, unable to use ACE/ARB inhibitor due to renal function.  #. T2DM.  CBGs stable, continue current regimen  #. Hypothyroid.  Continue levothyroxine.   #. Dementia.  Continue donepezil and sertraline.   DVT prophylaxis: SCD's  Code Status: DNR  Family Communication: Care giver at bedside  Disposition Plan: SNF when bed available   Consultants:   Neuro   Procedures:     Antimicrobials:     Objective: Vitals:   03/20/18 1653 03/20/18 2044 03/21/18 0020 03/21/18 0757  BP: (!) 139/93 (!) 145/84 135/75 (!) 150/74  Pulse: 63 80  87  Resp: Marland Kitchen)  22   (!) 22  Temp: 97.6 F (36.4 C) 98.4 F (36.9 C)  (!) 96.7 F (35.9 C)    TempSrc: Oral Oral  Axillary  SpO2: 100%   98%  Weight:      Height:        Intake/Output Summary (Last 24 hours) at 03/21/2018 0931 Last data filed at 03/20/2018 1602 Gross per 24 hour  Intake 409.54 ml  Output -  Net 409.54 ml   Filed Weights   03/19/18 1249 03/19/18 2324 03/20/18 0500  Weight: 51.6 kg 56.1 kg 56.1 kg    Examination:  General exam: Appears calm  HEENT: OP moist and clear Respiratory system: CTA, mild decrease BS at the bases b/l  Cardiovascular system: S1 & S2 heard, RRR. No JVD, murmurs, rubs or gallops Gastrointestinal system: Abdomen is nondistended, soft and nontender. Normal bowel sounds heard. Central nervous system: Slight lethargic, oriented to person and place only, non focal, slow speech  Extremities: No pedal edema Skin: No rashes   Data Reviewed: I have personally reviewed following labs and imaging studies  CBC: Recent Labs  Lab 03/17/18 1421 03/18/18 0243 03/20/18 0406  WBC 6.3 6.9 6.2  NEUTROABS 4.8  --  4.3  HGB 12.4 12.2 11.8*  HCT 36.6 35.9* 35.5*  MCV 81.7 79.1 80.5  PLT 219 219 811   Basic Metabolic Panel: Recent Labs  Lab 03/17/18 1421 03/18/18 0243 03/19/18 0206 03/20/18 0406 03/21/18 0224  NA 146* 145 145 144 145  K 3.2* 3.2* 3.5 4.1 4.7  CL 101 101 101 101 102  CO2 36* 34* 34* 33* 34*  GLUCOSE 134* 116* 103* 75 123*  BUN 10 12 13 18  29*  CREATININE 1.12* 1.00 1.52* 2.15* 2.22*  CALCIUM 9.5 9.1 9.1 9.1 9.3  MG  --   --  1.3*  --   --    GFR: Estimated Creatinine Clearance: 16.4 mL/min (A) (by C-G formula based on SCr of 2.22 mg/dL (H)). Liver Function Tests: Recent Labs  Lab 03/19/18 0206  AST 19  ALT 7  ALKPHOS 80  BILITOT 0.8  PROT 6.7  ALBUMIN 2.8*   No results for input(s): LIPASE, AMYLASE in the last 168 hours. Recent Labs  Lab 03/19/18 0206  AMMONIA 17   Coagulation Profile: No results for input(s): INR, PROTIME in the last 168 hours. Cardiac Enzymes: No results for input(s): CKTOTAL,  CKMB, CKMBINDEX, TROPONINI in the last 168 hours. BNP (last 3 results) No results for input(s): PROBNP in the last 8760 hours. HbA1C: Recent Labs    03/19/18 0206  HGBA1C 5.1   CBG: Recent Labs  Lab 03/19/18 0100 03/19/18 1853  GLUCAP 109* 90   Lipid Profile: Recent Labs    03/19/18 0206  CHOL 224*  HDL 53  LDLCALC 156*  TRIG 77  CHOLHDL 4.2   Thyroid Function Tests: Recent Labs    03/19/18 0209  TSH 6.512*   Anemia Panel: No results for input(s): VITAMINB12, FOLATE, FERRITIN, TIBC, IRON, RETICCTPCT in the last 72 hours. Sepsis Labs: No results for input(s): PROCALCITON, LATICACIDVEN in the last 168 hours.  Recent Results (from the past 240 hour(s))  MRSA PCR Screening     Status: None   Collection Time: 03/17/18 10:12 PM  Result Value Ref Range Status   MRSA by PCR NEGATIVE NEGATIVE Final    Comment:        The GeneXpert MRSA Assay (FDA approved for NASAL specimens only), is one component of a comprehensive MRSA colonization  surveillance program. It is not intended to diagnose MRSA infection nor to guide or monitor treatment for MRSA infections. Performed at [facility], [place] [address], [place], [place] [address]       Radiology Studies: Mr Virgel Paling WF Contrast  Result Date: 03/19/2018 CLINICAL DATA:  Follow-up examination for acute stroke, intracranial hemorrhage. EXAM: MRI HEAD WITHOUT CONTRAST MRA HEAD WITHOUT CONTRAST TECHNIQUE: Multiplanar, multiecho pulse sequences of the brain and surrounding structures were obtained without intravenous contrast. Angiographic images of the head were obtained using MRA technique without contrast. COMPARISON:  Prior CT from earlier the same day as well as previous MRI from 05/23/2017. FINDINGS: MRI HEAD FINDINGS Brain: Moderately advanced cerebral atrophy with advanced chronic microvascular ischemic disease, similar to previous. Multiple scatter remote lacunar infarcts present within the bilateral basal ganglia  and thalami. Chronic microvascular ischemic changes present within the pons. Probable small remote cortical infarcts involving the left frontal and parietal cortices. Multiple foci of chronic hemosiderin staining seen about several of these infarcts, with additional foci seen scattered throughout the supratentorial and infratentorial brain. Changes could be hypertensive in nature or related to cerebral amyloid angiopathy or a combination there of. Focal susceptibility artifacts seen associated with the previously identified superior right thalamic hemorrhage. No significant mass effect or edema. No other evidence for acute or subacute infarct. Gray-white matter differentiation otherwise maintained. No other evidence for acute intracranial hemorrhage. No mass lesion, midline shift or mass effect. Diffuse ventricular prominence related to global parenchymal volume loss of hydrocephalus. No extra-axial fluid collection. Pituitary gland normal. Vascular: Major intracranial vascular flow voids are maintained. Skull and upper cervical spine: Craniocervical junction normal. Multilevel degenerative spondylolysis noted within the upper cervical spine without significant stenosis. Bone marrow signal intensity normal. A scalp soft tissue abnormality. Sinuses/Orbits: Patient status post bilateral ocular lens replacement. Left sphenoid sinus retention cyst. Paranasal sinuses and mastoid air cells are otherwise clear. Inner ear structures grossly normal. Other: None. MRA HEAD FINDINGS ANTERIOR CIRCULATION: Distal cervical segments of the internal carotid arteries are patent with antegrade flow. Petrous segments patent bilaterally. Mild atheromatous irregularity within the cavernous/supraclinoid ICAs without hemodynamically significant stenosis. A1 segments patent bilaterally. Normal anterior communicating artery. Anterior cerebral arteries demonstrate mild atheromatous irregularity but are patent to their distal aspects without  high-grade stenosis. M1 segments patent bilaterally without stenosis. No proximal M2 occlusion. Distal small vessel atheromatous irregularity involving the MCA branches bilaterally. POSTERIOR CIRCULATION: Vertebral arteries patent to the vertebrobasilar junction without stenosis. Partially visualized posterior inferior cerebral arteries patent bilaterally. Basilar patent to its distal aspect without stenosis. Superior cerebral arteries patent bilaterally. Predominant fetal type origin of the PCAs bilaterally supplied via widely patent posterior communicating arteries. P2 segments widely patent bilaterally. Distal PCA branches not well seen on this exam, which may be related to high-grade stenoses and/or occlusion. No intracranial aneurysm. IMPRESSION: MRI HEAD IMPRESSION: 1. Stable 5 mm superior right thalamic intraparenchymal hemorrhage without mass effect. 2. No other acute intracranial abnormality identified. 3. Advanced atrophy with chronic small vessel ischemic disease with multiple chronic infarcts as above. 4. Innumerable chronic micro hemorrhages involving the supratentorial and infratentorial brain as above. Findings could reflect sequelae of chronic hypertensive encephalopathy and/or cerebral amyloid angiopathy (or a combination there of). MRA HEAD IMPRESSION: 1. Negative intracranial MRA for large vessel occlusion. No proximal high-grade or correctable stenosis identified. 2. Moderate distal small vessel atheromatous disease. Electronically Signed   By: Jeannine Boga M.D.   On: 03/19/2018 13:49   Mr Brain Lottie Dawson  Contrast  Result Date: 03/19/2018 CLINICAL DATA:  Follow-up examination for acute stroke, intracranial hemorrhage. EXAM: MRI HEAD WITHOUT CONTRAST MRA HEAD WITHOUT CONTRAST TECHNIQUE: Multiplanar, multiecho pulse sequences of the brain and surrounding structures were obtained without intravenous contrast. Angiographic images of the head were obtained using MRA technique without contrast.  COMPARISON:  Prior CT from earlier the same day as well as previous MRI from 05/23/2017. FINDINGS: MRI HEAD FINDINGS Brain: Moderately advanced cerebral atrophy with advanced chronic microvascular ischemic disease, similar to previous. Multiple scatter remote lacunar infarcts present within the bilateral basal ganglia and thalami. Chronic microvascular ischemic changes present within the pons. Probable small remote cortical infarcts involving the left frontal and parietal cortices. Multiple foci of chronic hemosiderin staining seen about several of these infarcts, with additional foci seen scattered throughout the supratentorial and infratentorial brain. Changes could be hypertensive in nature or related to cerebral amyloid angiopathy or a combination there of. Focal susceptibility artifacts seen associated with the previously identified superior right thalamic hemorrhage. No significant mass effect or edema. No other evidence for acute or subacute infarct. Gray-white matter differentiation otherwise maintained. No other evidence for acute intracranial hemorrhage. No mass lesion, midline shift or mass effect. Diffuse ventricular prominence related to global parenchymal volume loss of hydrocephalus. No extra-axial fluid collection. Pituitary gland normal. Vascular: Major intracranial vascular flow voids are maintained. Skull and upper cervical spine: Craniocervical junction normal. Multilevel degenerative spondylolysis noted within the upper cervical spine without significant stenosis. Bone marrow signal intensity normal. A scalp soft tissue abnormality. Sinuses/Orbits: Patient status post bilateral ocular lens replacement. Left sphenoid sinus retention cyst. Paranasal sinuses and mastoid air cells are otherwise clear. Inner ear structures grossly normal. Other: None. MRA HEAD FINDINGS ANTERIOR CIRCULATION: Distal cervical segments of the internal carotid arteries are patent with antegrade flow. Petrous segments  patent bilaterally. Mild atheromatous irregularity within the cavernous/supraclinoid ICAs without hemodynamically significant stenosis. A1 segments patent bilaterally. Normal anterior communicating artery. Anterior cerebral arteries demonstrate mild atheromatous irregularity but are patent to their distal aspects without high-grade stenosis. M1 segments patent bilaterally without stenosis. No proximal M2 occlusion. Distal small vessel atheromatous irregularity involving the MCA branches bilaterally. POSTERIOR CIRCULATION: Vertebral arteries patent to the vertebrobasilar junction without stenosis. Partially visualized posterior inferior cerebral arteries patent bilaterally. Basilar patent to its distal aspect without stenosis. Superior cerebral arteries patent bilaterally. Predominant fetal type origin of the PCAs bilaterally supplied via widely patent posterior communicating arteries. P2 segments widely patent bilaterally. Distal PCA branches not well seen on this exam, which may be related to high-grade stenoses and/or occlusion. No intracranial aneurysm. IMPRESSION: MRI HEAD IMPRESSION: 1. Stable 5 mm superior right thalamic intraparenchymal hemorrhage without mass effect. 2. No other acute intracranial abnormality identified. 3. Advanced atrophy with chronic small vessel ischemic disease with multiple chronic infarcts as above. 4. Innumerable chronic micro hemorrhages involving the supratentorial and infratentorial brain as above. Findings could reflect sequelae of chronic hypertensive encephalopathy and/or cerebral amyloid angiopathy (or a combination there of). MRA HEAD IMPRESSION: 1. Negative intracranial MRA for large vessel occlusion. No proximal high-grade or correctable stenosis identified. 2. Moderate distal small vessel atheromatous disease. Electronically Signed   By: Jeannine Boga M.D.   On: 03/19/2018 13:49      Scheduled Meds: . allopurinol  100 mg Oral Daily  . diltiazem  30 mg Oral  BID  . donepezil  10 mg Oral QHS  . famotidine  20 mg Oral Daily  . ferrous sulfate  325 mg Oral BID WC  .  isosorbide mononitrate  30 mg Oral BID  . levothyroxine  50 mcg Oral QAC breakfast  . metoprolol tartrate  12.5 mg Oral BID  . senna-docusate  1 tablet Oral BID  . sertraline  50 mg Oral Daily  . sodium chloride flush  3 mL Intravenous Q12H   Continuous Infusions: . sodium chloride    . lacosamide (VIMPAT) IV 75 mg (03/20/18 2111)     LOS: 4 days    Time spent: Total of 25 minutes spent with pt, greater than 50% of which was spent in discussion of  treatment, counseling and coordination of care    Chipper Oman, MD Pager: Text Page via [email or web address]   If 7PM-7AM, please contact night-coverage [email or web address] 03/21/2018, 9:31 AM   Note - This record has been created using [employer]. Chart creation errors have been sought, but may not always have been located. Such creation errors do not reflect on the standard of medical care.

## 2018-03-21 NOTE — Progress Notes (Signed)
PT Cancellation Note  Patient Details Name: KOLLEEN OCHSNER MRN: [id number] DOB: 08-Jun-1931   Cancelled Treatment:    Reason Eval/Treat Not Completed: (P) Other (comment)(Pt currently receiving bath. ) Will follow back later.   Kristalyn Bergstresser B. Migdalia Dk PT, DPT Acute Rehabilitation Services Pager [phone] Office [phone]    [place] 03/21/2018, 10:59 AM

## 2018-03-21 NOTE — NC FL2 (Signed)
[place] LEVEL OF CARE SCREENING TOOL     IDENTIFICATION  Patient Name: Nicole [name] Birthdate: May 19, 1931 Sex: female Admission Date (Current Location): 03/17/2018  [facility] and [place] Number:  Herbalist and Address:  [facility] [place] [address], [place], [place] [address]      Provider Number: [id number]  Attending Physician Name and Address:  Patrecia Pour, Christean Grief, MD  Relative Name and Phone Number:  [name], daughter    Current Level of Care: Hospital Recommended Level of Care: [place] Prior Approval Number:    Date Approved/Denied:   PASRR Number: [id number]  Discharge Plan: SNF    Current Diagnoses: Patient Active Problem List   Diagnosis Date Noted  . Intracerebral hemorrhage 03/19/2018  . Protein-calorie malnutrition, severe 03/19/2018  . Acute on chronic diastolic CHF (congestive heart failure) ([place]) 02/26/2018  . Pleural effusion on right 02/26/2018  . Stage III pressure ulcer of sacral region ([place]) 02/15/2018  . Pleural effusion   . Acute on chronic respiratory failure with hypoxia ([place]) 02/11/2018  . Hypoxemia 02/09/2018  . Atrial flutter ([place]) 10/28/2017  . Pressure injury of skin 10/11/2017  . Hyperkalemia 10/10/2017  . Dementia 10/10/2017  . CAD (coronary artery disease) 10/10/2017  . Elevated troponin 10/10/2017  . Stroke ([place])   . Shortness of breath   . Proteinuria   . Perforation of bile duct   . Pain in joint, lower leg   . Other chronic allergic conjunctivitis   . Kidney stone   . Hypothyroidism   . Hypopotassemia   . Hypoglycemia, unspecified   . Hypertension   . Hypercholesteremia   . Hemorrhage of rectum and anus   . Gout, unspecified   . GERD (gastroesophageal reflux disease)   . Dizziness and giddiness   . Diabetes mellitus ([place])   . Depression   . CKD (chronic kidney disease)   . Chronic systolic heart failure ([place])   . Cervicalgia   . Arthritis    . Anxiety   . AAA (abdominal aortic aneurysm) ([place])   . Moderate protein-calorie malnutrition ([place]) 07/11/2017  . Major depression 05/28/2017  . Adult failure to thrive 05/28/2017  . Acute encephalopathy 05/23/2017  . Acute lower UTI 05/23/2017  . Palliative care by specialist   . DNR (do not resuscitate)   . Bradyarrhythmia 01/11/2017  . Generalized weakness 01/11/2017  . General weakness 01/11/2017  . PE (pulmonary thromboembolism) ([place]) 08/24/2016  . Accelerated hypertension with diastolic congestive heart failure, NYHA class 3 ([place]) 01/05/2016  . Permanent atrial fibrillation ([place]) 01/05/2016  . Rib pain on right side 10/14/2015  . Hypothyroidism due to acquired atrophy of thyroid 10/14/2015  . Type 2 diabetes mellitus with diabetic nephropathy, without long-term current use of insulin ([place]) 10/14/2015  . Essential hypertension 10/14/2015  . Acute on chronic systolic heart failure ([place]) 08/02/2015  . Bradycardia 07/27/2015  . Chronic anemia 07/27/2015  . Chest pain   . Vascular dementia 06/13/2015  . Memory loss 06/13/2015  . Left hemiparesis ([place])   . Chronic diastolic heart failure ([place])   . Type 2 diabetes mellitus with peripheral neuropathy (HCC)   . ICH (intracerebral hemorrhage) ([place]) 04/04/2015  . Atrial fibrillation, chronic ([place])   . Hypertensive urgency   . SOB (shortness of breath) 04/03/2014  . Diabetic nephropathy ([place]) 09/07/2013  . Chronic constipation 03/25/2013  . Carpal tunnel syndrome 02/25/2013  . Hypokalemia 05/17/2012  . Small bowel obstruction due to adhesions (HCC)  05/16/2012  . Normal coronary arteries, 2008 05/15/2012  . Type II or unspecified type diabetes mellitus with neurological manifestations, not stated as uncontrolled(250.60) 05/15/2012  . Acute respiratory distress 05/15/2012  . Abdominal pain, acute, right upper quadrant 05/12/2012  . Cholecystitis chronic, acute, S/P lap cholecystectomy 05/14/12 05/12/2012  . Thoracic aortic  aneurysm without rupture ([place])   . CHF, past NICM with an EF of 30%, most recent echo 2011 showed EF to be45- 50%   . Anemia 08/09/2011  . History of colon cancer, stage III 08/09/2011  . Colon cancer ([place]) 07/23/1997    Orientation RESPIRATION BLADDER Height & Weight     Self  Normal Incontinent, External catheter Weight: 123 lb 10.9 oz (56.1 kg) Height:  5\' 8"  (172.7 cm)  BEHAVIORAL SYMPTOMS/MOOD NEUROLOGICAL BOWEL NUTRITION STATUS      Continent Diet(see discharge summary)  AMBULATORY STATUS COMMUNICATION OF NEEDS Skin   Extensive Assist Verbally Normal                       Personal Care Assistance Level of Assistance  Bathing, Feeding, Dressing Bathing Assistance: Maximum assistance Feeding assistance: Independent Dressing Assistance: Maximum assistance     Functional Limitations Info  Speech, Hearing, Sight Sight Info: Adequate Hearing Info: Adequate Speech Info: Adequate    SPECIAL CARE FACTORS FREQUENCY  PT (By licensed PT), OT (By licensed OT)     PT Frequency: 5x week OT Frequency: 5x week            Contractures Contractures Info: Not present    Additional Factors Info  Code Status, Allergies, Psychotropic Code Status Info: DNR Allergies Info: IOHEXOL, IODINATED DIAGNOSTIC AGENTS, Z-PAK AZITHROMYCIN  Psychotropic Info: donepezil (ARICEPT) tablet 10 mg daily at bedtime; sertraline (ZOLOFT) tablet 50 mg daily PO         Current Medications (03/21/2018):  This is the current hospital active medication list Current Facility-Administered Medications  Medication Dose Route Frequency Provider Last Rate Last Dose  . 0.9 %  sodium chloride infusion  250 mL Intravenous PRN Radene Gunning, NP      . acetaminophen (TYLENOL) tablet 650 mg  650 mg Oral Q4H PRN Amie Portland, MD       Or  . acetaminophen (TYLENOL) solution 650 mg  650 mg Per Tube Q4H PRN Amie Portland, MD       Or  . acetaminophen (TYLENOL) suppository 650 mg  650 mg Rectal Q4H PRN Amie Portland, MD      . albuterol (PROVENTIL) (2.5 MG/3ML) 0.083% nebulizer solution 2.5 mg  2.5 mg Nebulization Q4H PRN Patrecia Pour, MD      . allopurinol (ZYLOPRIM) tablet 100 mg  100 mg Oral Daily Radene Gunning, NP   100 mg at 03/21/18 0936  . diltiazem (CARDIZEM) tablet 30 mg  30 mg Oral BID Radene Gunning, NP   30 mg at 03/21/18 0258  . donepezil (ARICEPT) tablet 10 mg  10 mg Oral QHS Radene Gunning, NP   10 mg at 03/20/18 2056  . famotidine (PEPCID) tablet 20 mg  20 mg Oral Daily Arrien, Jimmy Picket, MD   20 mg at 03/21/18 0935  . ferrous sulfate tablet 325 mg  325 mg Oral BID WC Radene Gunning, NP   325 mg at 03/21/18 0936  . hydrALAZINE (APRESOLINE) injection 10 mg  10 mg Intravenous Q4H PRN Gardiner Barefoot, NP   10 mg at 03/19/18 1008  . HYDROcodone-acetaminophen (NORCO/VICODIN) 5-325  MG per tablet 1-2 tablet  1-2 tablet Oral Q4H PRN Radene Gunning, NP   1 tablet at 03/20/18 2218  . isosorbide mononitrate (IMDUR) 24 hr tablet 30 mg  30 mg Oral BID Radene Gunning, NP   30 mg at 03/21/18 0936  . lacosamide (VIMPAT) 75 mg in sodium chloride 0.9 % 25 mL IVPB  75 mg Intravenous Q12H Rosalin Hawking, MD 65 mL/hr at 03/21/18 1011 75 mg at 03/21/18 1011  . levothyroxine (SYNTHROID, LEVOTHROID) tablet 50 mcg  50 mcg Oral QAC breakfast Gardiner Barefoot, NP   50 mcg at 03/21/18 0935  . lidocaine (XYLOCAINE) 2 % injection    PRN Ardis Rowan, PA-C   10 mL at 03/19/18 1103  . LORazepam (ATIVAN) injection 1 mg  1 mg Intravenous Q4H PRN Arrien, Jimmy Picket, MD      . metoprolol tartrate (LOPRESSOR) injection 5 mg  5 mg Intravenous Q4H PRN Arrien, Jimmy Picket, MD   5 mg at 03/19/18 1006  . metoprolol tartrate (LOPRESSOR) tablet 12.5 mg  12.5 mg Oral BID Tawni Millers, MD   12.5 mg at 03/21/18 0934  . ondansetron (ZOFRAN) tablet 4 mg  4 mg Oral Q6H PRN Radene Gunning, NP       Or  . ondansetron [facility]) injection 4 mg  4 mg Intravenous Q6H PRN Black, Karen M, NP      .  senna-docusate (Senokot-S) tablet 1 tablet  1 tablet Oral QHS PRN Radene Gunning, NP      . senna-docusate (Senokot-S) tablet 1 tablet  1 tablet Oral BID Amie Portland, MD   1 tablet at 03/21/18 0935  . sertraline (ZOLOFT) tablet 50 mg  50 mg Oral Daily Radene Gunning, NP   50 mg at 03/21/18 1594  . sodium chloride flush (NS) 0.9 % injection 3 mL  3 mL Intravenous Q12H Radene Gunning, NP   3 mL at 03/21/18 0942  . sodium chloride flush (NS) 0.9 % injection 3 mL  3 mL Intravenous PRN Black, Lezlie Octave, NP         Discharge Medications: Please see discharge summary for a list of discharge medications.  Relevant Imaging Results:  Relevant Lab Results:   Additional Information [id number] [place], [place]

## 2018-03-22 LAB — BASIC METABOLIC PANEL
Anion gap: 9 (ref 5–15)
BUN: 29 mg/dL — AB (ref 8–23)
CO2: 31 mmol/L (ref 22–32)
CREATININE: 1.53 mg/dL — AB (ref 0.44–1.00)
Calcium: 8.9 mg/dL (ref 8.9–10.3)
Chloride: 105 mmol/L (ref 98–111)
GFR calc Af Amer: 35 mL/min — ABNORMAL LOW (ref >60)
GFR, EST NON AFRICAN AMERICAN: 30 mL/min — AB (ref >60)
GLUCOSE: 94 mg/dL (ref 70–99)
Potassium: 4.8 mmol/L (ref 3.5–5.1)
SODIUM: 145 mmol/L (ref 135–145)

## 2018-03-22 LAB — CBC
HEMATOCRIT: 36.2 % (ref 36.0–46.0)
Hemoglobin: 12 g/dL (ref 12.0–15.0)
MCH: 27.1 pg (ref 26.0–34.0)
MCHC: 33.1 g/dL (ref 30.0–36.0)
MCV: 81.9 fL (ref 78.0–100.0)
PLATELETS: 201 10*3/uL (ref 150–400)
RBC: 4.42 MIL/uL (ref 3.87–5.11)
RDW: 15.4 % (ref 11.5–15.5)
WBC: 7.8 10*3/uL (ref 4.0–10.5)

## 2018-03-22 LAB — MAGNESIUM: Magnesium: 2.3 mg/dL (ref 1.7–2.4)

## 2018-03-22 IMAGING — CR DG CHEST 2V
2 series · 2 of 2 positions shown · non-contrast
Comparison: 04/11/2016

CLINICAL DATA: Sudden shortness of breath.

EXAM:
CHEST  2 VIEW

[w chest lat]
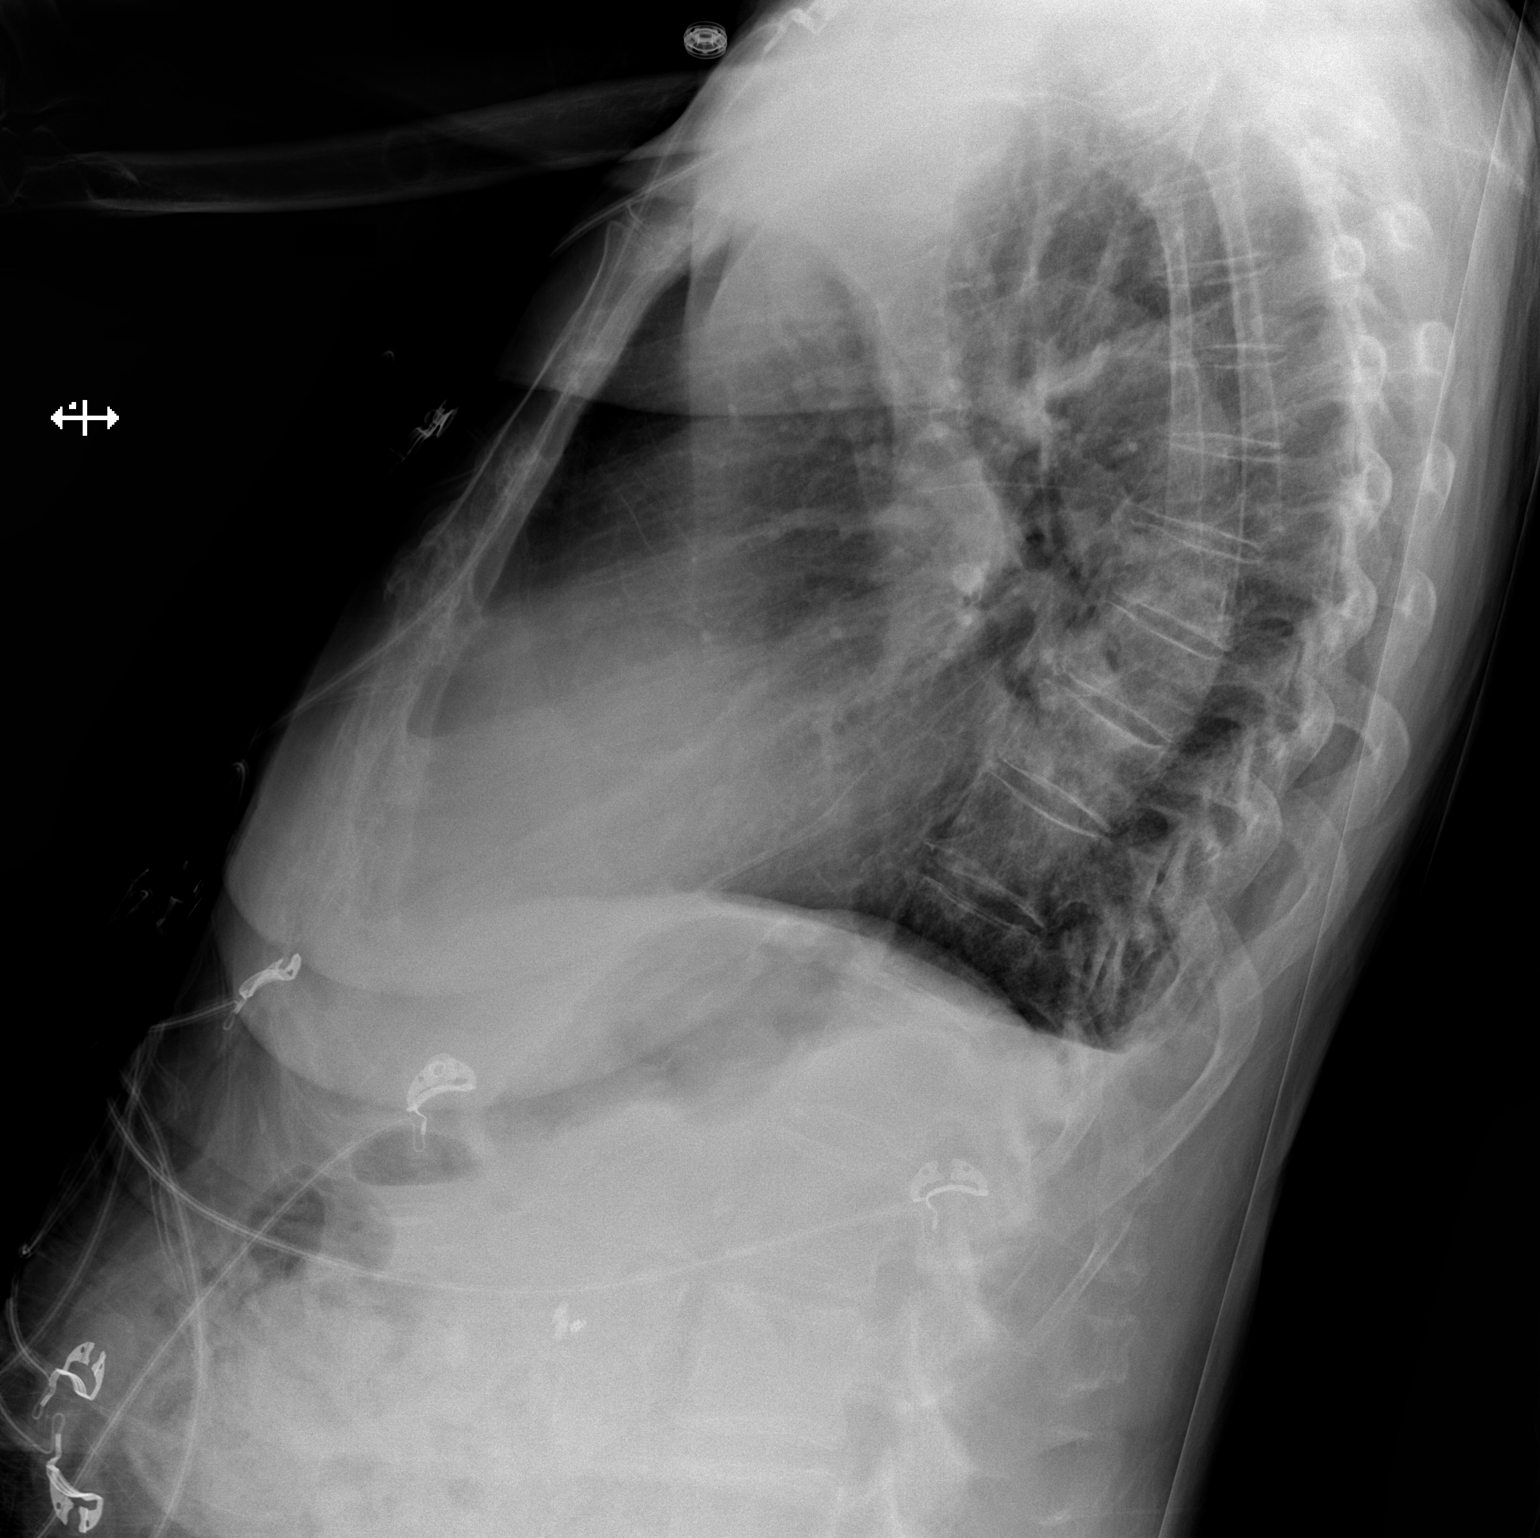

[x chest ap]
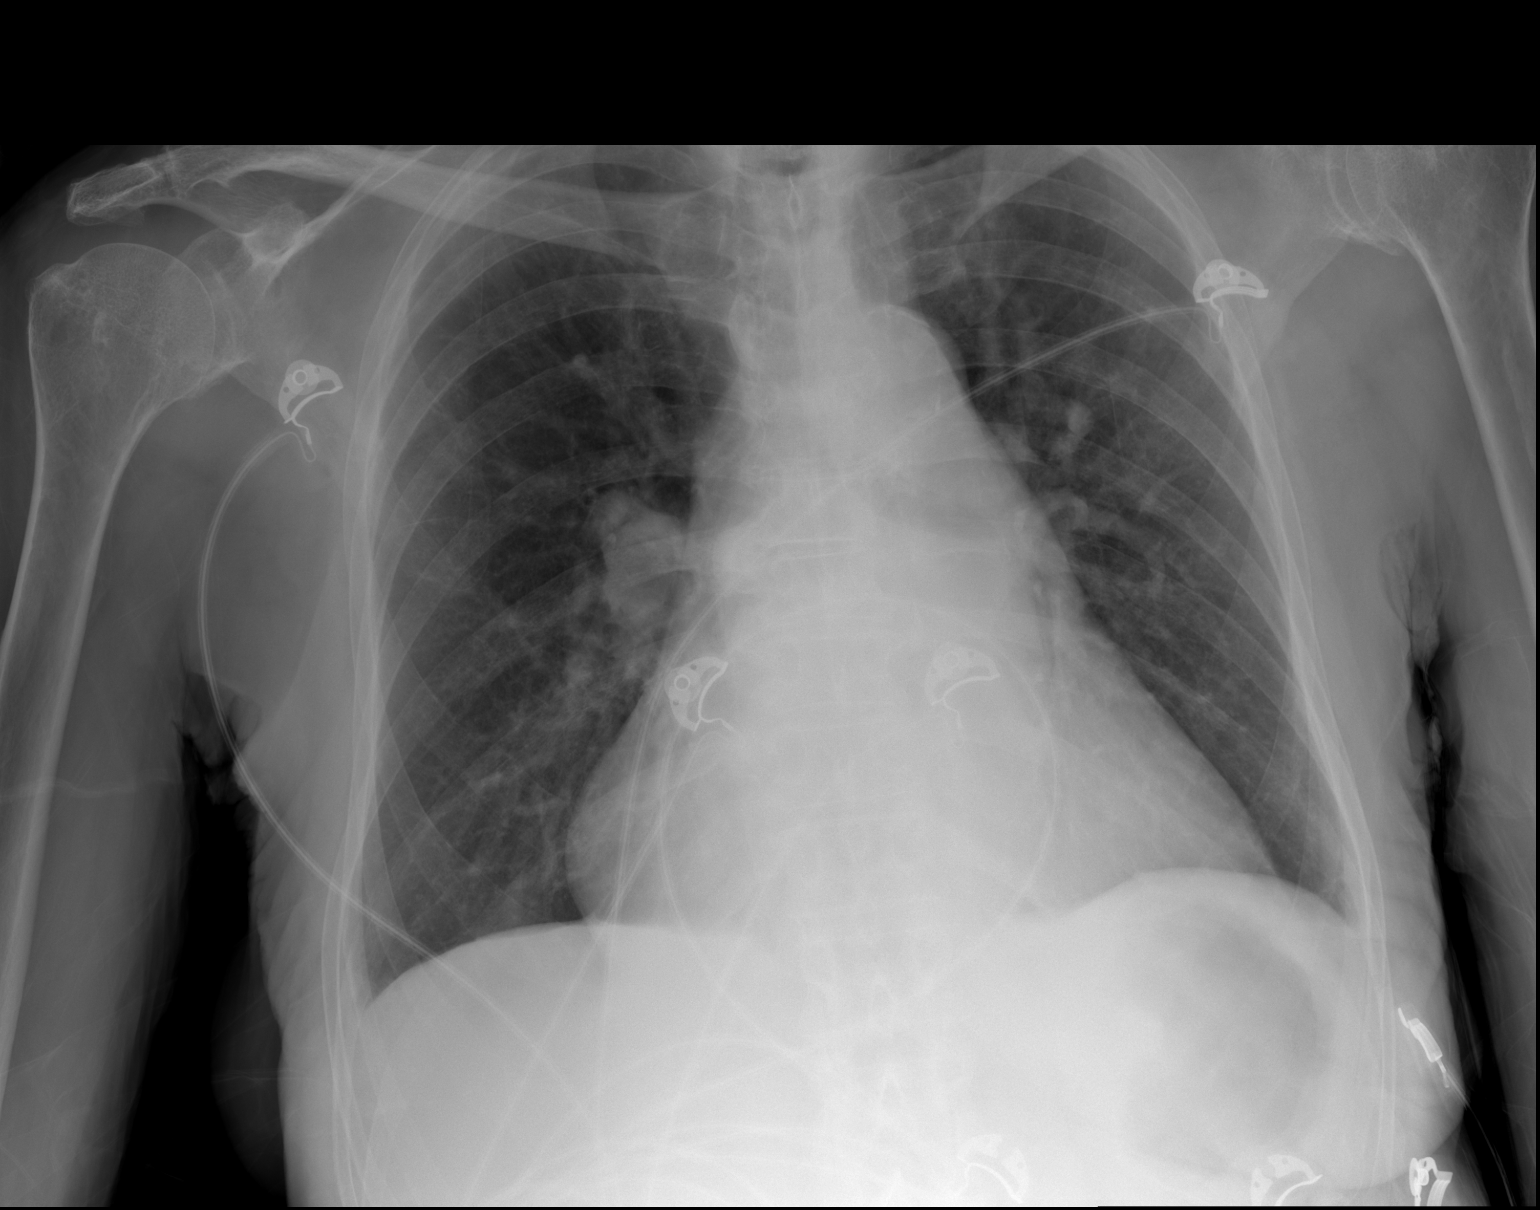

[2 of 2 positions shown; findings below may reference images not displayed]

FINDINGS: Stable enlargement of the cardiac silhouette. Central vascular
structures are prominent for size but stable. The lungs are clear
without pulmonary edema. The trachea is midline. There may be tiny
pleural effusions versus pleural thickening.
IMPRESSION: Stable cardiomegaly without pulmonary edema.

Tiny pleural effusions versus pleural thickening.

## 2018-03-22 NOTE — Clinical Social Work Note (Signed)
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) / Nicole [name] (POA)  Agency::  SNFs  Relationship::  Daughter/Friend  Contact Information:  [phone]  Housing/Transportation Living arrangements for the past 2 months:  Single Family Home Source of Information:  Power of [place], Adult Children Patient Interpreter Needed:  None Criminal Activity/Legal Involvement Pertinent to Current Situation/Hospitalization:  No - Comment as needed Significant Relationships:  Adult Children, Significant Other Lives with:  Significant Other Do you feel safe going back to the place where you live?  No Need for family participation in patient care:  Yes (Comment)  Care giving concerns:  CSW received consult for possible SNF placement at time of discharge. CSW spoke with patient's HCPOA (Daughter, Nicole [name]) and POA (Nicole [name]) regarding PT recommendation of SNF placement at time of discharge. Nicole [name] and Nicole [name] reported that they are currently unable to care for patient at their home given patient's current physical needs and fall risk. Nicole [name] and Nicole [name] expressed understanding of PT recommendation and are agreeable to SNF placement at time of discharge. CSW to continue to follow and assist with discharge planning needs.   Social Worker assessment / plan:  CSW spoke with patient's POA concerning possibility of rehab at [facility] before returning home.  Employment status:  Retired Nurse, adult PT Recommendations:  [place] / Referral to community resources:  [place]  Patient/Family's Response to care:  Patient's  POA/HCPOA recognize need for rehab before returning home and are agreeable to a SNF in [place]. They reported preference for [facility] since patient has been there before and liked it. Her most recent SNF stay was in April. CSW explained insurance pre-authorization process and that [facility] will not be open again until Tuesday. Nicole [name] has requested CSW contact Nicole [name] for updates.   Patient/Family's Understanding of and Emotional Response to Diagnosis, Current Treatment, and Prognosis:  Patient/family is realistic regarding therapy needs and expressed being hopeful for SNF placement. Patient's POA expressed understanding of CSW role and discharge process as well as medical condition. No questions/concerns about plan or treatment.    Emotional Assessment Appearance:  Appears stated age Attitude/Demeanor/Rapport:  Unable to Assess Affect (typically observed):  Unable to Assess Orientation:  Oriented to Self Alcohol / Substance use:  Not Applicable Psych involvement (Current and /or in the community):  No (Comment)  Discharge Needs  Concerns to be addressed:  Care Coordination Readmission within the last 30 days:  Yes Current discharge risk:  None Barriers to Discharge:  Continued Medical Work up   [employer], [place] 03/22/2018, 10:09 AM

## 2018-03-22 NOTE — Progress Notes (Signed)
PROGRESS NOTE    Nicole [name]  SNK:[id number] DOB: March 29, 1931 DOA: 03/17/2018 PCP: [name], DO     Brief Narrative:  84 year old woman initially admitted to the hospital on 8/26 with dyspnea.  Past medical history significant for chronic hypoxemic respiratory failure on 3 L of oxygen, chronic diastolic heart failure, chronic atrial fibrillation, type 2 diabetes and mild to moderate dementia.  She was found to have acute on chronic diastolic heart failure complicated by acute on chronic hypoxemic respiratory failure, atrial fibrillation with rapid ventricular response and pleural effusion.  On 8/28 was found to have a new ischemic/hemorrhagic CVA complicated with seizures.   Assessment & Plan:   Principal Problem:   Acute respiratory distress Active Problems:   Anemia   Acute on chronic systolic heart failure (HCC)   Essential hypertension   Bradyarrhythmia   DNR (do not resuscitate)   Diabetes mellitus ([place])   Anxiety   CAD (coronary artery disease)   Pleural effusion   Intracerebral hemorrhage   Protein-calorie malnutrition, severe   Acute CVA -Complicated with seizures -Continue Vimpat, EEG without seizure activity, neurology has signed off. -At this point awaiting SNF for rehab purposes.  Acute on chronic diastolic heart failure -With bilateral pleural effusions. -Improved symptoms after thoracentesis, 1.3 L removed. -Diuretics are currently on hold due to a slight uptake in creatinine, continue metoprolol and isosorbide. -Patient remains 1.7 L negative overall since admission, even over the past 24 hours.  Acute on chronic kidney disease stage III -Likely secondary to aggressive diuresis, holding diuretics. -Avoid hemodynamic changes and nephrotoxic agents.,  Continue to monitor urine output.  Chronic atrial fibrillation -Heart rate is controlled, on diltiazem and metoprolol. -Not any anticoagulation due to intracranial  bleed.  Hypertension -Well-controlled, do not use ACE inhibitors or ARB use due to renal function.  Hypothyroidism -Continue Synthroid  Dementia -Continue donepezil and sertraline.  Type 2 diabetes -Stable, continue current regimen.   DVT prophylaxis: SCDs Code Status: DNR Family Communication: Significant other at bedside updated on plan of care and all questions answered Disposition Plan: Await bed placement at SNF, continue to follow renal function  Consultants:   None currently, neurology has signed off  Procedures:   None  Antimicrobials:  Anti-infectives (From admission, onward)   None       Subjective: Lying in bed, sleeping peacefully, arouses easily to voice, per significant other at bedside has been more interactive.  Objective: Vitals:   03/21/18 1155 03/21/18 1627 03/21/18 2126 03/22/18 0154  BP: (!) 149/85 (!) 155/102 (!) 175/87 (!) 146/82  Pulse: (!) 59 70 67 72  Resp: (!) 22 18 15 18   Temp: 97.6 F (36.4 C) 97.8 F (36.6 C)    TempSrc: Oral Oral    SpO2: 100% 100%  100%  Weight:      Height:        Intake/Output Summary (Last 24 hours) at 03/22/2018 0741 Last data filed at 03/21/2018 0942 Gross per 24 hour  Intake 3 ml  Output -  Net 3 ml   Filed Weights   03/19/18 1249 03/19/18 2324 03/20/18 0500  Weight: 51.6 kg 56.1 kg 56.1 kg    Examination:  General exam: Alert, awake, oriented x 2 Respiratory system: Clear to auscultation. Respiratory effort normal. Cardiovascular system:RRR. No murmurs, rubs, gallops. Gastrointestinal system: Abdomen is nondistended, soft and nontender. No organomegaly or masses felt. Normal bowel sounds heard. Central nervous system: Moves all 4 spontaneously Extremities: No C/C/E, +pedal pulses Skin: No rashes, lesions  or ulcers    Data Reviewed: I have personally reviewed following labs and imaging studies  CBC: Recent Labs  Lab 03/17/18 1421 03/18/18 0243 03/20/18 0406  WBC 6.3 6.9 6.2   NEUTROABS 4.8  --  4.3  HGB 12.4 12.2 11.8*  HCT 36.6 35.9* 35.5*  MCV 81.7 79.1 80.5  PLT 219 219 235   Basic Metabolic Panel: Recent Labs  Lab 03/17/18 1421 03/18/18 0243 03/19/18 0206 03/20/18 0406 03/21/18 0224  NA 146* 145 145 144 145  K 3.2* 3.2* 3.5 4.1 4.7  CL 101 101 101 101 102  CO2 36* 34* 34* 33* 34*  GLUCOSE 134* 116* 103* 75 123*  BUN 10 12 13 18  29*  CREATININE 1.12* 1.00 1.52* 2.15* 2.22*  CALCIUM 9.5 9.1 9.1 9.1 9.3  MG  --   --  1.3*  --   --    GFR: Estimated Creatinine Clearance: 16.4 mL/min (A) (by C-G formula based on SCr of 2.22 mg/dL (H)). Liver Function Tests: Recent Labs  Lab 03/19/18 0206  AST 19  ALT 7  ALKPHOS 80  BILITOT 0.8  PROT 6.7  ALBUMIN 2.8*   No results for input(s): LIPASE, AMYLASE in the last 168 hours. Recent Labs  Lab 03/19/18 0206  AMMONIA 17   Coagulation Profile: No results for input(s): INR, PROTIME in the last 168 hours. Cardiac Enzymes: No results for input(s): CKTOTAL, CKMB, CKMBINDEX, TROPONINI in the last 168 hours. BNP (last 3 results) No results for input(s): PROBNP in the last 8760 hours. HbA1C: No results for input(s): HGBA1C in the last 72 hours. CBG: Recent Labs  Lab 03/19/18 0100 03/19/18 1853  GLUCAP 109* 90   Lipid Profile: No results for input(s): CHOL, HDL, LDLCALC, TRIG, CHOLHDL, LDLDIRECT in the last 72 hours. Thyroid Function Tests: No results for input(s): TSH, T4TOTAL, FREET4, T3FREE, THYROIDAB in the last 72 hours. Anemia Panel: No results for input(s): VITAMINB12, FOLATE, FERRITIN, TIBC, IRON, RETICCTPCT in the last 72 hours. Urine analysis:    Component Value Date/Time   COLORURINE YELLOW 02/14/2018 [place] 02/14/2018 1635   LABSPEC 1.010 02/14/2018 1635   PHURINE 5.0 02/14/2018 1635   GLUCOSEU NEGATIVE 02/14/2018 1635   HGBUR NEGATIVE 02/14/2018 1635   BILIRUBINUR NEGATIVE 02/14/2018 1635   KETONESUR NEGATIVE 02/14/2018 1635   PROTEINUR NEGATIVE  02/14/2018 1635   UROBILINOGEN 2.0 (H) 04/13/2015 1844   NITRITE NEGATIVE 02/14/2018 1635   LEUKOCYTESUR NEGATIVE 02/14/2018 1635   Sepsis Labs: @LABRCNTIP (procalcitonin:4,lacticidven:4)  ) Recent Results (from the past 240 hour(s))  MRSA PCR Screening     Status: None   Collection Time: 03/17/18 10:12 PM  Result Value Ref Range Status   MRSA by PCR NEGATIVE NEGATIVE Final    Comment:        The GeneXpert MRSA Assay (FDA approved for NASAL specimens only), is one component of a comprehensive MRSA colonization surveillance program. It is not intended to diagnose MRSA infection nor to guide or monitor treatment for MRSA infections. Performed at [facility], [place] [address], [place], [place] [address]          Radiology Studies: No results found.      Scheduled Meds: . allopurinol  100 mg Oral Daily  . diltiazem  30 mg Oral BID  . donepezil  10 mg Oral QHS  . famotidine  20 mg Oral Daily  . ferrous sulfate  325 mg Oral BID WC  . isosorbide mononitrate  30 mg Oral BID  .  levothyroxine  50 mcg Oral QAC breakfast  . metoprolol tartrate  12.5 mg Oral BID  . senna-docusate  1 tablet Oral BID  . sertraline  50 mg Oral Daily  . sodium chloride flush  3 mL Intravenous Q12H   Continuous Infusions: . sodium chloride    . lacosamide (VIMPAT) IV 75 mg (03/21/18 2202)     LOS: 5 days    Time spent: 35 minutes. Greater than 50% of this time was spent in direct contact with the patient, and with patient's significant other coordinating care and discussing relevant ongoing clinical issues, including plan for SNF for rehab following CVA and plans to continue to monitor renal function.     Lelon Frohlich, MD [facility] Pager [phone]  If 7PM-7AM, please contact night-coverage [email or web address] Password [facility] 03/22/2018, 7:41 AM

## 2018-03-23 DIAGNOSIS — I2510 Atherosclerotic heart disease of native coronary artery without angina pectoris: Secondary | ICD-10-CM

## 2018-03-23 DIAGNOSIS — F419 Anxiety disorder, unspecified: Secondary | ICD-10-CM

## 2018-03-23 DIAGNOSIS — E1169 Type 2 diabetes mellitus with other specified complication: Secondary | ICD-10-CM

## 2018-03-23 LAB — CBC
HEMATOCRIT: 36.3 % (ref 36.0–46.0)
HEMOGLOBIN: 12 g/dL (ref 12.0–15.0)
MCH: 26.9 pg (ref 26.0–34.0)
MCHC: 33.1 g/dL (ref 30.0–36.0)
MCV: 81.4 fL (ref 78.0–100.0)
Platelets: 224 10*3/uL (ref 150–400)
RBC: 4.46 MIL/uL (ref 3.87–5.11)
RDW: 15.2 % (ref 11.5–15.5)
WBC: 8 10*3/uL (ref 4.0–10.5)

## 2018-03-23 LAB — BASIC METABOLIC PANEL
ANION GAP: 7 (ref 5–15)
BUN: 28 mg/dL — ABNORMAL HIGH (ref 8–23)
CHLORIDE: 104 mmol/L (ref 98–111)
CO2: 35 mmol/L — ABNORMAL HIGH (ref 22–32)
Calcium: 9 mg/dL (ref 8.9–10.3)
Creatinine, Ser: 1.38 mg/dL — ABNORMAL HIGH (ref 0.44–1.00)
GFR calc non Af Amer: 34 mL/min — ABNORMAL LOW (ref >60)
GFR, EST AFRICAN AMERICAN: 39 mL/min — AB (ref >60)
GLUCOSE: 106 mg/dL — AB (ref 70–99)
Potassium: 3.7 mmol/L (ref 3.5–5.1)
Sodium: 146 mmol/L — ABNORMAL HIGH (ref 135–145)

## 2018-03-23 MED ORDER — RESOURCE THICKENUP CLEAR PO POWD
ORAL | Status: DC | PRN
  Administered 2018-03-25: 16:00:00 via ORAL
  Filled 2018-03-23: qty 125

## 2018-03-23 MED ORDER — POLYETHYLENE GLYCOL 3350 17 G PO PACK
17.0000 g | PACK | Freq: Every day | ORAL | Status: DC
  Administered 2018-03-23 – 2018-03-26 (×4): 17 g via ORAL
  Filled 2018-03-23 (×4): qty 1

## 2018-03-23 MED ORDER — LACOSAMIDE 50 MG PO TABS
75.0000 mg | ORAL_TABLET | Freq: Two times a day (BID) | ORAL | Status: DC
  Administered 2018-03-24 – 2018-03-26 (×5): 75 mg via ORAL
  Filled 2018-03-23 (×6): qty 2

## 2018-03-23 NOTE — Plan of Care (Signed)
Pt does not report pain, does not exhibit s/s of pain.

## 2018-03-23 NOTE — Progress Notes (Addendum)
PROGRESS NOTE    Nicole [name]  HLK:[id number] DOB: [birth year] DOA: 03/17/2018 PCP: [name], DO    Brief Narrative:  84 year old female who presented with dyspnea. She does have significant past medical history for chronic hypoxic respiratory failure, (3 LPM),diastolic heart failure, chronic atrial fibrillation, type 2 diabetes mellitus, and dementia. Reported 3 days of worsening dyspnea, associated with lower extremity edema, no chest pain or palpitations. On initial physical examination blood pressure 206/140, heart rate 137, respiratory 25-33, temperature 98.3, oxygen saturation 98%. Heart S1-S2 present, tachycardic, irregularly irregular, lungs with decreased breath sounds, predominantly on the right base, abdomen soft nontender, trace lower extremity edema. Patient was oriented to self and place. Her chest x-ray had a right pleural effusion. EKG had atrial fibrillation, normal axis, right bundle branch block.  Patient was admitted to the hospital with working diagnosis of acute on chronic diastolic heart failure complicated by acute on chronic hypoxic respiratory failure,atrial fibrillation with rapid ventricular response and right pleural effusion.   08/28.New ischemic/hemorrhagic CVA (small thalamic bleed), complicated with seizures.    Assessment & Plan:   Principal Problem:   Acute respiratory distress Active Problems:   Anemia   Acute on chronic systolic heart failure (HCC)   Essential hypertension   Bradyarrhythmia   DNR (do not resuscitate)   Diabetes mellitus ([place])   Anxiety   CAD (coronary artery disease)   Pleural effusion   Intracerebral hemorrhage   Protein-calorie malnutrition, severe   1. Acute CVA complicated with seizures and acute hypoxic respiratory failure. On neuro checks and aspiration precautions. Tolerating po medications. This am more somnolent. Will change lacosimde to po formulation in preparation for discharge.   2. Acute on  chronicdiastolicheart failure exacerbation with bilateral pleural effusions, more right than left. sp thoracentesis, 1.3 Liters. Clinically euvolemic, continue to hold on diuresis, continue metoprolol and isosorbide.   3. Chronic atrial fibrillation. Rate controlled with diltiazem and metoprolol, no anticoagulation due to intracranial bleed.   4. HTN. Tolerating well isosorbide, diltiazem and metoprolol, systolic blood pressure 734 to 147 mmHg  5. T2DM. Continue glucose cover and monitoring with insulin sliding scale, capillary glucose 111, 76, 71, 109, 90.   6. Hypothyroid. On levothyroxine.   7. Dementia. On donepezil and sertraline.   8. Severe calorie protein malnutrition. Continue with nutritional supplements.   9. AKI on CKD stage 3. Continue to hold on diuretics, renal function close to baseline with serum cr at 1.38 down from 1,53.  Serum K at 3,7 and bicarbonate at 35.  DVT prophylaxis:scd Code Status:dnr Family Communication:no family at the bedside   Disposition Plan/ discharge barriers: pending placement at snf.   Consultants:  Neurology  Procedures:    Antimicrobials:     Subjective: This am patient is somnolent, not in pain or dyspnea. Has been taking po meds, no nausea or vomiting. Last night with intermittent somnolence.   Objective: Vitals:   03/22/18 2142 03/22/18 2143 03/22/18 2344 03/23/18 0753  BP: (!) 141/99 (!) 141/99 (!) 147/87 117/84  Pulse:  (!) 118 86 (!) 59  Resp:   20 16  Temp:   98.2 F (36.8 C) (!) 97.4 F (36.3 C)  TempSrc:   Oral Oral  SpO2:  100% 100% 100%  Weight:      Height:        Intake/Output Summary (Last 24 hours) at 03/23/2018 0755 Last data filed at 03/23/2018 0600 Gross per 24 hour  Intake 1595.34 ml  Output 500 ml  Net 1095.34  ml   Filed Weights   03/19/18 1249 03/19/18 2324 03/20/18 0500  Weight: 51.6 kg 56.1 kg 56.1 kg    Examination:   General: Not in pain or dyspnea, deconditioned   Neurology: somnolent this am.   E ENT: mild pallor, no icterus, oral mucosa dry.  Cardiovascular: No JVD. S1-S2 present, irregularly irregular, no gallops, rubs, or murmurs. No lower extremity edema. Pulmonary: positive breath sounds bilaterally, decreased air movement due to poor air movment, no wheezing, rhonchi or rales. Gastrointestinal. Abdomen with no organomegaly, non tender, no rebound or guarding Skin. No rashes Musculoskeletal: no joint deformities     Data Reviewed: I have personally reviewed following labs and imaging studies  CBC: Recent Labs  Lab 03/17/18 1421 03/18/18 0243 03/20/18 0406 03/22/18 0706 03/23/18 0244  WBC 6.3 6.9 6.2 7.8 8.0  NEUTROABS 4.8  --  4.3  --   --   HGB 12.4 12.2 11.8* 12.0 12.0  HCT 36.6 35.9* 35.5* 36.2 36.3  MCV 81.7 79.1 80.5 81.9 81.4  PLT 219 219 211 201 562   Basic Metabolic Panel: Recent Labs  Lab 03/19/18 0206 03/20/18 0406 03/21/18 0224 03/22/18 0706 03/23/18 0244  NA 145 144 145 145 146*  K 3.5 4.1 4.7 4.8 3.7  CL 101 101 102 105 104  CO2 34* 33* 34* 31 35*  GLUCOSE 103* 75 123* 94 106*  BUN 13 18 29* 29* 28*  CREATININE 1.52* 2.15* 2.22* 1.53* 1.38*  CALCIUM 9.1 9.1 9.3 8.9 9.0  MG 1.3*  --   --  2.3  --    GFR: Estimated Creatinine Clearance: 26.4 mL/min (A) (by C-G formula based on SCr of 1.38 mg/dL (H)). Liver Function Tests: Recent Labs  Lab 03/19/18 0206  AST 19  ALT 7  ALKPHOS 80  BILITOT 0.8  PROT 6.7  ALBUMIN 2.8*   No results for input(s): LIPASE, AMYLASE in the last 168 hours. Recent Labs  Lab 03/19/18 0206  AMMONIA 17   Coagulation Profile: No results for input(s): INR, PROTIME in the last 168 hours. Cardiac Enzymes: No results for input(s): CKTOTAL, CKMB, CKMBINDEX, TROPONINI in the last 168 hours. BNP (last 3 results) No results for input(s): PROBNP in the last 8760 hours. HbA1C: No results for input(s): HGBA1C in the last 72 hours. CBG: Recent Labs  Lab 03/19/18 0100  03/19/18 1853  GLUCAP 109* 90   Lipid Profile: No results for input(s): CHOL, HDL, LDLCALC, TRIG, CHOLHDL, LDLDIRECT in the last 72 hours. Thyroid Function Tests: No results for input(s): TSH, T4TOTAL, FREET4, T3FREE, THYROIDAB in the last 72 hours. Anemia Panel: No results for input(s): VITAMINB12, FOLATE, FERRITIN, TIBC, IRON, RETICCTPCT in the last 72 hours.    Radiology Studies: I have reviewed all of the imaging during this hospital visit personally     Scheduled Meds: . allopurinol  100 mg Oral Daily  . diltiazem  30 mg Oral BID  . donepezil  10 mg Oral QHS  . famotidine  20 mg Oral Daily  . ferrous sulfate  325 mg Oral BID WC  . isosorbide mononitrate  30 mg Oral BID  . levothyroxine  50 mcg Oral QAC breakfast  . metoprolol tartrate  12.5 mg Oral BID  . senna-docusate  1 tablet Oral BID  . sertraline  50 mg Oral Daily  . sodium chloride flush  3 mL Intravenous Q12H   Continuous Infusions: . sodium chloride    . lacosamide (VIMPAT) IV 75 mg (03/22/18 2217)  LOS: 6 days        Tawni Millers, MD [facility] Pager [phone]

## 2018-03-24 DIAGNOSIS — I509 Heart failure, unspecified: Secondary | ICD-10-CM

## 2018-03-24 DIAGNOSIS — I5023 Acute on chronic systolic (congestive) heart failure: Secondary | ICD-10-CM

## 2018-03-24 LAB — BASIC METABOLIC PANEL
Anion gap: 7 (ref 5–15)
BUN: 25 mg/dL — ABNORMAL HIGH (ref 8–23)
CHLORIDE: 106 mmol/L (ref 98–111)
CO2: 35 mmol/L — ABNORMAL HIGH (ref 22–32)
Calcium: 9.1 mg/dL (ref 8.9–10.3)
Creatinine, Ser: 1.41 mg/dL — ABNORMAL HIGH (ref 0.44–1.00)
GFR calc non Af Amer: 33 mL/min — ABNORMAL LOW (ref >60)
GFR, EST AFRICAN AMERICAN: 38 mL/min — AB (ref >60)
Glucose, Bld: 93 mg/dL (ref 70–99)
POTASSIUM: 3.9 mmol/L (ref 3.5–5.1)
SODIUM: 148 mmol/L — AB (ref 135–145)

## 2018-03-24 MED ORDER — DEXTROSE 5 % IV SOLN
INTRAVENOUS | Status: DC
  Administered 2018-03-24 – 2018-03-26 (×2): via INTRAVENOUS

## 2018-03-24 NOTE — Progress Notes (Signed)
Physical Therapy Treatment Patient Details Name: Nicole [name] MRN: [id number] DOB: 06-02-1931 Today's Date: 03/24/2018    History of Present Illness Pt is an 84 y.o. female admitted 03/17/18 with worsening SOB; worked up for CHF exacerbation. Rapid response called 8/28 due to unresponsiveness; suspected seizure activity. EEG consistent with encephaloapthy. CT 8/28 showed R superior thalamus acute hemorrhage, no associated mass effect; multiple chronic infarcts. Negative intracranial MRA for large vessel occlusion. PMH includes CHF, HTN, CKD, a-fib/flutter, DM, colon cancer, AAA, CVA, dementia.    PT Comments    Pt admitted with above diagnosis. Pt currently with functional limitations due to the deficits listed below (see PT Problem List). Pt was only able to perform exercises.  Lethargic throughout but could assist with activity to command.  Pt will benefit from skilled PT to increase their independence and safety with mobility to allow discharge to the venue listed below.     Follow Up Recommendations  SNF     Equipment Recommendations  Other (comment)(TBD at next venue)    Recommendations for Other Services       Precautions / Restrictions Precautions Precautions: Fall Restrictions Weight Bearing Restrictions: No    Mobility  Bed Mobility               General bed mobility comments: Pt lethargic.  Bed exercises only  Transfers                    Ambulation/Gait             General Gait Details: unable due to lethargy   Stairs             Wheelchair Mobility    Modified Rankin (Stroke Patients Only)       Balance                                            Cognition Arousal/Alertness: Awake/alert Behavior During Therapy: Flat affect Overall Cognitive Status: History of cognitive impairments - at baseline Area of Impairment: Orientation;Attention;Memory;Following commands;Safety/judgement;Awareness;Problem  solving                 Orientation Level: Disoriented to;Place;Situation;Time Current Attention Level: Sustained Memory: Decreased short-term memory Following Commands: Follows one step commands inconsistently Safety/Judgement: Decreased awareness of safety;Decreased awareness of deficits Awareness: Intellectual Problem Solving: Slow processing;Decreased initiation;Difficulty sequencing;Requires verbal cues;Requires tactile cues General Comments: pt only oriented to self      Exercises General Exercises - Upper Extremity Shoulder Flexion: AROM;Both;10 reps;Supine;AAROM Shoulder ADduction: AROM;Both;10 reps;Supine;AAROM Elbow Flexion: AROM;Both;10 reps;Supine;AAROM General Exercises - Lower Extremity Ankle Circles/Pumps: AROM;Both;10 reps;Supine;AAROM Long Arc Quad: AROM;Both;10 reps;Supine;AAROM Heel Slides: AROM;Both;10 reps;Supine;AAROM    General Comments        Pertinent Vitals/Pain Pain Assessment: No/denies pain    Home Living                      Prior Function            PT Goals (current goals can now be found in the care plan section) Acute Rehab PT Goals Patient Stated Goal: none stated Time For Goal Achievement: 04/03/18 Progress towards PT goals: Not progressing toward goals - comment(lethargic)    Frequency    Min 2X/week      PT Plan Current plan remains appropriate    Co-evaluation  AM-PAC PT "6 Clicks" Daily Activity  Outcome Measure  Difficulty turning over in bed (including adjusting bedclothes, sheets and blankets)?: Unable Difficulty moving from lying on back to sitting on the side of the bed? : Unable Difficulty sitting down on and standing up from a chair with arms (e.g., wheelchair, bedside commode, etc,.)?: Unable Help needed moving to and from a bed to chair (including a wheelchair)?: Total Help needed walking in hospital room?: Total Help needed climbing 3-5 steps with a railing? : Total 6  Click Score: 6    End of Session Equipment Utilized During Treatment: Gait belt Activity Tolerance: Patient limited by fatigue Patient left: with call bell/phone within reach;in bed;with bed alarm set Nurse Communication: Mobility status PT Visit Diagnosis: Other abnormalities of gait and mobility (R26.89)     Time: 7564-3329 PT Time Calculation (min) (ACUTE ONLY): 9 min  Charges:  $Therapeutic Exercise: 8-22 mins                     [facility] Acute Rehabilitation [phone] (pager)    Denice Paradise 03/24/2018, 1:12 PM

## 2018-03-24 NOTE — Progress Notes (Signed)
PROGRESS NOTE    Nicole [name]  JJK:[id number] DOB: [birth year] DOA: 03/17/2018 PCP: [name], DO    Brief Narrative:  84 year old female who presented with dyspnea. She does have significant past medical history for chronic hypoxic respiratory failure, (3 LPM),diastolic heart failure, chronic atrial fibrillation, type 2 diabetes mellitus, and dementia. Reported 3 days of worsening dyspnea, associated with lower extremity edema, no chest pain or palpitations. On initial physical examination blood pressure 206/140, heart rate 137, respiratory 25-33, temperature 98.3, oxygen saturation 98%. Heart S1-S2 present, tachycardic, irregularly irregular, lungs with decreased breath sounds, predominantly on the right base, abdomen soft nontender, trace lower extremity edema. Patient was oriented to self and place. Her chest x-ray had a right pleural effusion. EKG had atrial fibrillation, normal axis, right bundle branch block.  Patient was admitted to the hospital with working diagnosis of acute on chronic diastolic heart failure complicated byacute onchronic hypoxic respiratory failure,atrial fibrillation with rapid ventricular response and right pleural effusion.   08/28.New ischemic/hemorrhagic CVA(small thalamic bleed), complicated with seizures.   Assessment & Plan:   Principal Problem:   Acute respiratory distress Active Problems:   Anemia   Acute on chronic systolic heart failure (HCC)   Essential hypertension   Bradyarrhythmia   DNR (do not resuscitate)   Diabetes mellitus ([place])   Anxiety   CAD (coronary artery disease)   Pleural effusion   Intracerebral hemorrhage   Protein-calorie malnutrition, severe  1. Acute CVA complicated with seizuresand acute hypoxic respiratory failure. Patient has refusing medications, continue to encourage po meds, neuro checks and aspiration precautions. Pending placement at the snf. Not on aspirin due to intracranial bleed.    2. Acute on chronicdiastolicheart failure exacerbation with bilateral pleural effusions, more right than left. sp thoracentesis, 1.3 Liters. Continue on metoprolol and isosorbide. On exam this am looks hypovolemic, patient with very poor oral intake, will add gentle hydration.   3. Chronic atrial fibrillation.Hear rate has remained controlled with AV blockade with diltiazem and metoprolol, not on anticoagulation due to intracranial bleed.   4. HTN.On diltiazem and metoprolol, systolic blood pressure 371 to 150 mmHg.   5. T2DM.fasting glucose this am is 93, patient with very poor oral intake, will continue glucose monitoring with insulin sliding scale. Will add IV dextrose for now at slow rate.    6. Hypothyroid.Continue levothyroxine.   7. Dementia. Patient has been refusing medications, confused but not agitated will continue donepezil and sertraline.  8. Severe calorie protein malnutrition. On nutritional supplements.   9. AKI on CKD stage 3 with hyeprnatremia. Renal function with stable cr at 1,41 from 1,38, but worsening hypernatremia, will add D5W at 50 ml for 1 liter, and follow electrolytes in am, patient with very poor oral intake. Holding diuresis.   Subjective: Patient continue to be confused, refused medications this am, no nausea or vomiting, no apparent chest pain or dyspnea. History limited due to confusion.   Objective: Vitals:   03/23/18 0753 03/23/18 0843 03/23/18 2334 03/24/18 0500  BP: 117/84  (!) 151/115   Pulse: (!) 59 84 (!) 133   Resp: 16  19   Temp: (!) 97.4 F (36.3 C)  97.6 F (36.4 C)   TempSrc: Oral  Oral   SpO2: 100%  100%   Weight:    51.9 kg  Height:        Intake/Output Summary (Last 24 hours) at 03/24/2018 0809 Last data filed at 03/24/2018 0000 Gross per 24 hour  Intake 240 ml  Output  100 ml  Net 140 ml   Filed Weights   03/19/18 2324 03/20/18 0500 03/24/18 0500  Weight: 56.1 kg 56.1 kg 51.9 kg    Examination:   General:  deconditioned  Neurology: somnolent but easy to arouse.  E ENT: mild pallor, no icterus, oral mucosa dry Cardiovascular: No JVD. S1-S2 present, rhythmic, no gallops, rubs, or murmurs. No lower extremity edema. Pulmonary: postive breath sounds bilaterally, decreased air movement due to poor inspiratory effoert, no wheezing, rhonchi or rales. Gastrointestinal. Abdomen with no organomegaly, non tender, no rebound or guarding Skin. No rashes Musculoskeletal: no joint deformities     Data Reviewed: I have personally reviewed following labs and imaging studies  CBC: Recent Labs  Lab 03/17/18 1421 03/18/18 0243 03/20/18 0406 03/22/18 0706 03/23/18 0244  WBC 6.3 6.9 6.2 7.8 8.0  NEUTROABS 4.8  --  4.3  --   --   HGB 12.4 12.2 11.8* 12.0 12.0  HCT 36.6 35.9* 35.5* 36.2 36.3  MCV 81.7 79.1 80.5 81.9 81.4  PLT 219 219 211 201 440   Basic Metabolic Panel: Recent Labs  Lab 03/19/18 0206 03/20/18 0406 03/21/18 0224 03/22/18 0706 03/23/18 0244 03/24/18 0333  NA 145 144 145 145 146* 148*  K 3.5 4.1 4.7 4.8 3.7 3.9  CL 101 101 102 105 104 106  CO2 34* 33* 34* 31 35* 35*  GLUCOSE 103* 75 123* 94 106* 93  BUN 13 18 29* 29* 28* 25*  CREATININE 1.52* 2.15* 2.22* 1.53* 1.38* 1.41*  CALCIUM 9.1 9.1 9.3 8.9 9.0 9.1  MG 1.3*  --   --  2.3  --   --    GFR: Estimated Creatinine Clearance: 23.9 mL/min (A) (by C-G formula based on SCr of 1.41 mg/dL (H)). Liver Function Tests: Recent Labs  Lab 03/19/18 0206  AST 19  ALT 7  ALKPHOS 80  BILITOT 0.8  PROT 6.7  ALBUMIN 2.8*   No results for input(s): LIPASE, AMYLASE in the last 168 hours. Recent Labs  Lab 03/19/18 0206  AMMONIA 17   Coagulation Profile: No results for input(s): INR, PROTIME in the last 168 hours. Cardiac Enzymes: No results for input(s): CKTOTAL, CKMB, CKMBINDEX, TROPONINI in the last 168 hours. BNP (last 3 results) No results for input(s): PROBNP in the last 8760 hours. HbA1C: No results for input(s): HGBA1C  in the last 72 hours. CBG: Recent Labs  Lab 03/19/18 0100 03/19/18 1853  GLUCAP 109* 90   Lipid Profile: No results for input(s): CHOL, HDL, LDLCALC, TRIG, CHOLHDL, LDLDIRECT in the last 72 hours. Thyroid Function Tests: No results for input(s): TSH, T4TOTAL, FREET4, T3FREE, THYROIDAB in the last 72 hours. Anemia Panel: No results for input(s): VITAMINB12, FOLATE, FERRITIN, TIBC, IRON, RETICCTPCT in the last 72 hours.    Radiology Studies: I have reviewed all of the imaging during this hospital visit personally     Scheduled Meds: . allopurinol  100 mg Oral Daily  . diltiazem  30 mg Oral BID  . donepezil  10 mg Oral QHS  . famotidine  20 mg Oral Daily  . ferrous sulfate  325 mg Oral BID WC  . isosorbide mononitrate  30 mg Oral BID  . lacosamide  75 mg Oral BID  . levothyroxine  50 mcg Oral QAC breakfast  . metoprolol tartrate  12.5 mg Oral BID  . polyethylene glycol  17 g Oral Daily  . sertraline  50 mg Oral Daily  . sodium chloride flush  3 mL Intravenous Q12H  Continuous Infusions: . sodium chloride       LOS: 7 days        Tawni Millers, MD [facility] Pager [phone]

## 2018-03-24 NOTE — Progress Notes (Signed)
  Speech Language Pathology Treatment: Dysphagia  Patient Details Name: Nicole [name] MRN: [id number] DOB: 06-Nov-1930 Today's Date: 03/24/2018 Time: 2060-1561 SLP Time Calculation (min) (ACUTE ONLY): 14 min  Assessment / Plan / Recommendation Clinical Impression  Pt has immediate coughing that follows cup and straw sips of thin water. Mod cues were provided to initiate posterior transfer of bolus due to oral holding. Pt declined solid trials, but had no immediate coughing observed with even large sips of nectar thick liquids. She did have two delayed coughs as SLP was exiting the room, after PO trials had ended and education had been provided to pt/family. If related to possible aspiration, this could be a delayed reaction to thin or nectar thick liquids; however, on current diet pt remains afebrile and with clear lung sounds. Would continue current diet for now with consideration for MBS if pt continues to show signs of dysphagia and/or aspiration clinically.   HPI HPI: 84 year old female admitted 03/17/18 with worsening SOB. DC'd 3 weeks ago with similar dx. PMH: chronic hypoxic respiratory failure, 3LNC at home, dCHF, AFib/flutter, DM, dementia, pleural effusion, CKD, colon cancer, SBO, CVA, thoacic aneurysm without rupture.      SLP Plan  Continue with current plan of care       Recommendations  Diet recommendations: Dysphagia 1 (puree);Nectar-thick liquid Liquids provided via: Cup;Straw Medication Administration: Crushed with puree Supervision: Staff to assist with self feeding;Full supervision/cueing for compensatory strategies Compensations: Minimize environmental distractions;Slow rate;Small sips/bites Postural Changes and/or Swallow Maneuvers: Seated upright 90 degrees                Oral Care Recommendations: Oral care BID Follow up Recommendations: Skilled Nursing facility SLP Visit Diagnosis: Dysphagia, unspecified (R13.10) Plan: Continue with current plan of  care       GO                Germain Osgood 03/24/2018, 2:01 PM  Germain Osgood, M.A. [place] Acute Environmental education officer [phone] Office [phone]

## 2018-03-24 NOTE — Social Work (Signed)
CSW continuing to follow, pt has [facility] which is closed on weekends and the Labor Day holiday. Pt will require insurance authorization to discharge to SNF, pt family has requested [facility] who is aware and following.   Alexander [place], [place] Work [phone]

## 2018-03-24 NOTE — Progress Notes (Signed)
Attempted to administer night medications to patient at 2217. Patient initially refused meds and said she would take them at another time. Pt was alert to self only and did not follow commands. Attempted to give meds again at 2345 and patient refused yet again. Pt was only alert to self did not follow commands.

## 2018-03-25 DIAGNOSIS — E1122 Type 2 diabetes mellitus with diabetic chronic kidney disease: Secondary | ICD-10-CM

## 2018-03-25 DIAGNOSIS — N183 Chronic kidney disease, stage 3 (moderate): Secondary | ICD-10-CM

## 2018-03-25 DIAGNOSIS — F0281 Dementia in other diseases classified elsewhere with behavioral disturbance: Secondary | ICD-10-CM

## 2018-03-25 LAB — BASIC METABOLIC PANEL
ANION GAP: 5 (ref 5–15)
BUN: 29 mg/dL — ABNORMAL HIGH (ref 8–23)
CALCIUM: 8.8 mg/dL — AB (ref 8.9–10.3)
CO2: 36 mmol/L — ABNORMAL HIGH (ref 22–32)
Chloride: 103 mmol/L (ref 98–111)
Creatinine, Ser: 1.46 mg/dL — ABNORMAL HIGH (ref 0.44–1.00)
GFR calc Af Amer: 37 mL/min — ABNORMAL LOW (ref >60)
GFR calc non Af Amer: 32 mL/min — ABNORMAL LOW (ref >60)
GLUCOSE: 123 mg/dL — AB (ref 70–99)
Potassium: 4.1 mmol/L (ref 3.5–5.1)
Sodium: 144 mmol/L (ref 135–145)

## 2018-03-25 NOTE — Care Management Note (Signed)
Case Management Note  Patient Details  Name: Nicole [name] MRN: [id number] Date of Birth: Oct 20, 1930  Subjective/Objective:   From home with spouse, she is active with [facility] for [facility], but will need higher level of care at dc. She will be going to SNF, [facility] CSW following.                 Action/Plan: DC to SNF when medically ready.  Expected Discharge Date:  03/20/18               Expected Discharge Plan:  Skilled Nursing Facility  In-House Referral:  Clinical Social Work  Discharge planning Services  CM Consult  Post Acute Care Choice:    Choice offered to:     DME Arranged:    DME Agency:     HH Arranged:    [place] Agency:     Status of Service:  Completed, signed off  If discussed at [employer], dates discussed:    Additional Comments:  Zenon Mayo, RN 03/25/2018, 12:00 PM

## 2018-03-25 NOTE — Progress Notes (Signed)
PROGRESS NOTE    Nicole [name]  HDQ:[id number] DOB: [birth year] DOA: 03/17/2018 PCP: [name], DO    Brief Narrative:  84 year old female who presented with dyspnea. She does have significant past medical history for chronic hypoxic respiratory failure, (3 LPM),diastolic heart failure, chronic atrial fibrillation, type 2 diabetes mellitus, and dementia. Reported 3 days of worsening dyspnea, associated with lower extremity edema, no chest pain or palpitations. On initial physical examination blood pressure 206/140, heart rate 137, respiratory 25-33, temperature 98.3, oxygen saturation 98%. Heart S1-S2 present, tachycardic, irregularly irregular, lungs with decreased breath sounds, predominantly on the right base, abdomen soft nontender, trace lower extremity edema. Patient was oriented to self and place. Her chest x-ray had a right pleural effusion. EKG had atrial fibrillation, normal axis, right bundle branch block.  Patient was admitted to the hospital with working diagnosis of acute on chronic diastolic heart failure complicated byacute onchronic hypoxic respiratory failure,atrial fibrillation with rapid ventricular response and right pleural effusion.   08/28.New ischemic/hemorrhagic CVA(small thalamic bleed), complicated with seizures.   Assessment & Plan:   Principal Problem:   Acute respiratory distress Active Problems:   Anemia   Acute on chronic systolic heart failure (HCC)   Essential hypertension   Bradyarrhythmia   DNR (do not resuscitate)   Diabetes mellitus ([place])   Anxiety   CAD (coronary artery disease)   Pleural effusion   Intracerebral hemorrhage   Protein-calorie malnutrition, severe   1. Acute CVA complicated with seizuresand acute hypoxic respiratory failure.Continue encourage po intake. Pending placement at the snf. Avoiding aspirin due to intracranial bleed. Continue seizure prophylaxis with lacosamide 75 mg po bid/   2. Acute on  chronicdiastolicheart failure exacerbation with bilateral pleural effusions, more right than left. spthoracentesis, 1.3 Liters.Tolerating well gentle hydration, will continue metoprolol and isosorbide.   3. Chronic atrial fibrillation.Continue rate controlled withdiltiazem and metoprolol, holding on anticoagulation due to intracranial bleed.  4. HTN.Continue with diltiazem and metoprolol, systolic blood pressure 119 to 417 mmHg systolic.  5. T2DM.this am asting glucose this am is 123. Continue IV dextrose for hydration, patient with very poor oral intake.   6. Hypothyroid. On levothyroxine.   7. Dementia.Continuedonepezil and sertraline.  8. Severe calorie protein malnutrition. Continue nutritional supplements.  9. Pre -renal AKI on CKD stage 3 with hyeprnatremia. Serum cr at 1,46 with K at 4,1 and serum bicarbonate at 36. Na down to 144 from 148, will continue dextrose infusion at 50 ml per hour.   Subjective: Patient with no chest pain or dyspnea, she keeps her eyes closed and not cooperating with questioning.   Objective: Vitals:   03/24/18 0500 03/24/18 0811 03/24/18 1652 03/25/18 0008  BP:  (!) 147/106 (!) 154/94 (!) 144/88  Pulse:  67 97 91  Resp:    18  Temp:  97.7 F (36.5 C) 97.9 F (36.6 C) 98.6 F (37 C)  TempSrc:  Oral Oral Oral  SpO2:  100% 98% 100%  Weight: 51.9 kg     Height:        Intake/Output Summary (Last 24 hours) at 03/25/2018 0756 Last data filed at 03/25/2018 0300 Gross per 24 hour  Intake 1033.87 ml  Output 250 ml  Net 783.87 ml   Filed Weights   03/19/18 2324 03/20/18 0500 03/24/18 0500  Weight: 56.1 kg 56.1 kg 51.9 kg    Examination:   General: deconditioned  Neurology: Awake and alert, non focal  E ENT: mild pallor, no icterus, oral mucosa dry Cardiovascular: No JVD.  S1-S2 present, rhythmic, no gallops, rubs, or murmurs. No lower extremity edema. Pulmonary: positive breath sounds bilaterally, adequate air movement, no  wheezing, rhonchi or rales. Gastrointestinal. Abdomen with no organomegaly, non tender, no rebound or guarding Skin. No rashes Musculoskeletal: no joint deformities     Data Reviewed: I have personally reviewed following labs and imaging studies  CBC: Recent Labs  Lab 03/20/18 0406 03/22/18 0706 03/23/18 0244  WBC 6.2 7.8 8.0  NEUTROABS 4.3  --   --   HGB 11.8* 12.0 12.0  HCT 35.5* 36.2 36.3  MCV 80.5 81.9 81.4  PLT 211 201 144   Basic Metabolic Panel: Recent Labs  Lab 03/19/18 0206  03/21/18 0224 03/22/18 0706 03/23/18 0244 03/24/18 0333 03/25/18 0300  NA 145   < > 145 145 146* 148* 144  K 3.5   < > 4.7 4.8 3.7 3.9 4.1  CL 101   < > 102 105 104 106 103  CO2 34*   < > 34* 31 35* 35* 36*  GLUCOSE 103*   < > 123* 94 106* 93 123*  BUN 13   < > 29* 29* 28* 25* 29*  CREATININE 1.52*   < > 2.22* 1.53* 1.38* 1.41* 1.46*  CALCIUM 9.1   < > 9.3 8.9 9.0 9.1 8.8*  MG 1.3*  --   --  2.3  --   --   --    < > = values in this interval not displayed.   GFR: Estimated Creatinine Clearance: 23.1 mL/min (A) (by C-G formula based on SCr of 1.46 mg/dL (H)). Liver Function Tests: Recent Labs  Lab 03/19/18 0206  AST 19  ALT 7  ALKPHOS 80  BILITOT 0.8  PROT 6.7  ALBUMIN 2.8*   No results for input(s): LIPASE, AMYLASE in the last 168 hours. Recent Labs  Lab 03/19/18 0206  AMMONIA 17   Coagulation Profile: No results for input(s): INR, PROTIME in the last 168 hours. Cardiac Enzymes: No results for input(s): CKTOTAL, CKMB, CKMBINDEX, TROPONINI in the last 168 hours. BNP (last 3 results) No results for input(s): PROBNP in the last 8760 hours. HbA1C: No results for input(s): HGBA1C in the last 72 hours. CBG: Recent Labs  Lab 03/19/18 0100 03/19/18 1853  GLUCAP 109* 90   Lipid Profile: No results for input(s): CHOL, HDL, LDLCALC, TRIG, CHOLHDL, LDLDIRECT in the last 72 hours. Thyroid Function Tests: No results for input(s): TSH, T4TOTAL, FREET4, T3FREE, THYROIDAB in  the last 72 hours. Anemia Panel: No results for input(s): VITAMINB12, FOLATE, FERRITIN, TIBC, IRON, RETICCTPCT in the last 72 hours.    Radiology Studies: I have reviewed all of the imaging during this hospital visit personally     Scheduled Meds: . allopurinol  100 mg Oral Daily  . diltiazem  30 mg Oral BID  . donepezil  10 mg Oral QHS  . famotidine  20 mg Oral Daily  . isosorbide mononitrate  30 mg Oral BID  . lacosamide  75 mg Oral BID  . levothyroxine  50 mcg Oral QAC breakfast  . metoprolol tartrate  12.5 mg Oral BID  . polyethylene glycol  17 g Oral Daily  . sertraline  50 mg Oral Daily  . sodium chloride flush  3 mL Intravenous Q12H   Continuous Infusions: . sodium chloride    . dextrose 50 mL/hr at 03/24/18 1042     LOS: 8 days        Chad Tiznado Gerome Apley, MD [facility] Pager [phone]

## 2018-03-26 DIAGNOSIS — R569 Unspecified convulsions: Secondary | ICD-10-CM | POA: Diagnosis not present

## 2018-03-26 DIAGNOSIS — R451 Restlessness and agitation: Secondary | ICD-10-CM | POA: Diagnosis not present

## 2018-03-26 DIAGNOSIS — I129 Hypertensive chronic kidney disease with stage 1 through stage 4 chronic kidney disease, or unspecified chronic kidney disease: Secondary | ICD-10-CM | POA: Diagnosis not present

## 2018-03-26 DIAGNOSIS — I2510 Atherosclerotic heart disease of native coronary artery without angina pectoris: Secondary | ICD-10-CM | POA: Diagnosis not present

## 2018-03-26 DIAGNOSIS — R0602 Shortness of breath: Secondary | ICD-10-CM | POA: Diagnosis not present

## 2018-03-26 DIAGNOSIS — I5032 Chronic diastolic (congestive) heart failure: Secondary | ICD-10-CM | POA: Diagnosis not present

## 2018-03-26 DIAGNOSIS — J90 Pleural effusion, not elsewhere classified: Secondary | ICD-10-CM | POA: Diagnosis not present

## 2018-03-26 DIAGNOSIS — Z743 Need for continuous supervision: Secondary | ICD-10-CM | POA: Diagnosis not present

## 2018-03-26 DIAGNOSIS — I10 Essential (primary) hypertension: Secondary | ICD-10-CM | POA: Diagnosis not present

## 2018-03-26 DIAGNOSIS — F0391 Unspecified dementia with behavioral disturbance: Secondary | ICD-10-CM | POA: Diagnosis not present

## 2018-03-26 DIAGNOSIS — R279 Unspecified lack of coordination: Secondary | ICD-10-CM | POA: Diagnosis not present

## 2018-03-26 DIAGNOSIS — M109 Gout, unspecified: Secondary | ICD-10-CM | POA: Diagnosis not present

## 2018-03-26 DIAGNOSIS — F0390 Unspecified dementia without behavioral disturbance: Secondary | ICD-10-CM | POA: Diagnosis not present

## 2018-03-26 DIAGNOSIS — R0603 Acute respiratory distress: Secondary | ICD-10-CM | POA: Diagnosis not present

## 2018-03-26 DIAGNOSIS — I482 Chronic atrial fibrillation: Secondary | ICD-10-CM | POA: Diagnosis not present

## 2018-03-26 DIAGNOSIS — I5033 Acute on chronic diastolic (congestive) heart failure: Secondary | ICD-10-CM | POA: Diagnosis not present

## 2018-03-26 DIAGNOSIS — I498 Other specified cardiac arrhythmias: Secondary | ICD-10-CM

## 2018-03-26 DIAGNOSIS — R69 Illness, unspecified: Secondary | ICD-10-CM | POA: Diagnosis not present

## 2018-03-26 DIAGNOSIS — J189 Pneumonia, unspecified organism: Secondary | ICD-10-CM | POA: Diagnosis not present

## 2018-03-26 DIAGNOSIS — I5023 Acute on chronic systolic (congestive) heart failure: Secondary | ICD-10-CM | POA: Diagnosis not present

## 2018-03-26 DIAGNOSIS — I69191 Dysphagia following nontraumatic intracerebral hemorrhage: Secondary | ICD-10-CM | POA: Diagnosis not present

## 2018-03-26 DIAGNOSIS — M6281 Muscle weakness (generalized): Secondary | ICD-10-CM | POA: Diagnosis not present

## 2018-03-26 DIAGNOSIS — R278 Other lack of coordination: Secondary | ICD-10-CM | POA: Diagnosis not present

## 2018-03-26 DIAGNOSIS — N183 Chronic kidney disease, stage 3 (moderate): Secondary | ICD-10-CM | POA: Diagnosis not present

## 2018-03-26 DIAGNOSIS — R2689 Other abnormalities of gait and mobility: Secondary | ICD-10-CM | POA: Diagnosis not present

## 2018-03-26 DIAGNOSIS — I69115 Cognitive social or emotional deficit following nontraumatic intracerebral hemorrhage: Secondary | ICD-10-CM | POA: Diagnosis not present

## 2018-03-26 DIAGNOSIS — R2681 Unsteadiness on feet: Secondary | ICD-10-CM | POA: Diagnosis not present

## 2018-03-26 DIAGNOSIS — I509 Heart failure, unspecified: Secondary | ICD-10-CM | POA: Diagnosis not present

## 2018-03-26 DIAGNOSIS — D508 Other iron deficiency anemias: Secondary | ICD-10-CM | POA: Diagnosis not present

## 2018-03-26 DIAGNOSIS — E039 Hypothyroidism, unspecified: Secondary | ICD-10-CM | POA: Diagnosis not present

## 2018-03-26 DIAGNOSIS — D509 Iron deficiency anemia, unspecified: Secondary | ICD-10-CM | POA: Diagnosis not present

## 2018-03-26 DIAGNOSIS — I639 Cerebral infarction, unspecified: Secondary | ICD-10-CM | POA: Diagnosis not present

## 2018-03-26 DIAGNOSIS — R296 Repeated falls: Secondary | ICD-10-CM | POA: Diagnosis not present

## 2018-03-26 DIAGNOSIS — E119 Type 2 diabetes mellitus without complications: Secondary | ICD-10-CM | POA: Diagnosis not present

## 2018-03-26 LAB — BASIC METABOLIC PANEL
Anion gap: 6 (ref 5–15)
BUN: 28 mg/dL — ABNORMAL HIGH (ref 8–23)
CALCIUM: 8.7 mg/dL — AB (ref 8.9–10.3)
CO2: 34 mmol/L — ABNORMAL HIGH (ref 22–32)
CREATININE: 1.39 mg/dL — AB (ref 0.44–1.00)
Chloride: 101 mmol/L (ref 98–111)
GFR calc non Af Amer: 34 mL/min — ABNORMAL LOW (ref >60)
GFR, EST AFRICAN AMERICAN: 39 mL/min — AB (ref >60)
Glucose, Bld: 90 mg/dL (ref 70–99)
Potassium: 4.4 mmol/L (ref 3.5–5.1)
SODIUM: 141 mmol/L (ref 135–145)

## 2018-03-26 MED ORDER — RESOURCE THICKENUP CLEAR PO POWD: ORAL | Status: DC

## 2018-03-26 MED ORDER — CLONIDINE HCL 0.1 MG PO TABS
0.1000 mg | ORAL_TABLET | Freq: Once | ORAL | Status: AC
  Administered 2018-03-26: 0.1 mg via ORAL
  Filled 2018-03-26: qty 1

## 2018-03-26 MED ORDER — FUROSEMIDE 20 MG PO TABS: 20.0000 mg | ORAL_TABLET | Freq: Every day | ORAL | 11 refills | Status: DC | PRN

## 2018-03-26 MED ORDER — LACOSAMIDE 50 MG PO TABS: 75.0000 mg | ORAL_TABLET | Freq: Two times a day (BID) | ORAL | Status: AC

## 2018-03-26 NOTE — Clinical Social Work Placement (Signed)
   CLINICAL SOCIAL WORK PLACEMENT  NOTE Isaias Cowman RN to call report to [phone] Date:  03/26/2018  Patient Details  Name: Nicole [name] MRN: [id number] Date of Birth: August 25, 1930  Clinical Social Work is seeking post-discharge placement for this patient at the [facility] level of care (*CSW will initial, date and re-position this form in  chart as items are completed):  Yes   Patient/family provided with [place] Work Department's list of facilities offering this level of care within the geographic area requested by the patient (or if unable, by the patient's family).  Yes   Patient/family informed of their freedom to choose among providers that offer the needed level of care, that participate in Medicare, Medicaid or managed care program needed by the patient, have an available bed and are willing to accept the patient.  Yes   Patient/family informed of [facility]'s ownership interest in [facility] and [facility], as well as of the fact that they are under no obligation to receive care at these facilities.  PASRR submitted to EDS on       PASRR number received on       Existing PASRR number confirmed on 03/22/18     FL2 transmitted to all facilities in geographic area requested by pt/family on 03/22/18     FL2 transmitted to all facilities within larger geographic area on       Patient informed that his/her managed care company has contracts with or will negotiate with certain facilities, including the following:        Yes   Patient/family informed of bed offers received.  Patient chooses bed at [facility]     Physician recommends and patient chooses bed at      Patient to be transferred to [facility] on 03/26/18.  Patient to be transferred to facility by [facility]     Patient family notified on 03/26/18 of transfer.  Name of family member notified:  pt friend [name]     PHYSICIAN Please sign DNR     Additional  Comment:    _______________________________________________ Alexander [place], [place] 03/26/2018, 4:40 PM

## 2018-03-26 NOTE — Progress Notes (Addendum)
Nutrition Follow Up  DOCUMENTATION CODES:   Severe malnutrition in context of chronic illness  INTERVENTION:    Magic cup TID with meals, each supplement provides 290 kcal and 9 grams of protein  Need to address nutrition with family -- if within [place] rec Cortrak 10 F feeding tube placement  NUTRITION DIAGNOSIS:   Severe Malnutrition related to chronic illness(CKD, CHF, dementia ) as evidenced by severe fat depletion, severe muscle depletion, 28 percent weight loss in 8 months, ongoing  GOAL:   Patient will meet greater than or equal to 90% of their needs, unmet  MONITOR:   Labs, Weight trends, I & O's, Skin, Diet advancement  ASSESSMENT:   84 y.o. female past medical history of AAA, diabetes, depression, hypercholesterolemia, hypertension, ICH last year secondary to being on anticoagulation for PE, documented atrial fibrillation, significant dementia, who was admitted for acute on chronic hypoxic respiratory failure likely due to CHF exacerbation. Pt also found to have CVA   Pt s/p bedside swallow evaluation 8/29.  SLP recommending Dysphagia 1-nectar thick liquids. PO intake has been very poor at 5% per flowsheet records. Pt's insurance authorization is pending with [facility] SNF. Labs and medications reviewed.  Diet Order:   Diet Order            DIET - DYS 1 Room service appropriate? Yes; Fluid consistency: Nectar Thick  Diet effective now             EDUCATION NEEDS:   Not appropriate for education at this time  Skin:  Skin Assessment: Reviewed RN Assessment  Last BM:  8/27  Height:   Ht Readings from Last 1 Encounters:  03/17/18 5\' 8"  (1.727 m)    Weight:   Wt Readings from Last 1 Encounters:  03/24/18 51.9 kg    Ideal Body Weight:  63.6 kg  BMI:  Body mass index is 17.4 kg/m.  Estimated Nutritional Needs:   Kcal:  1400-1600  Protein:  65-80 gm  Fluid:  >/= 1.5 L  Arthur Holms, RD, LDN Pager #: [phone] After-Hours Pager #:  [phone]

## 2018-03-26 NOTE — Progress Notes (Signed)
[facility] is here to pick pt up for discharge to [facility]  BP 171/112.  [facility] unable to transport.  IV has already been removed so cannot give prn hydralazine.  MD paged for suggestions.

## 2018-03-26 NOTE — Progress Notes (Signed)
Occupational Therapy Treatment Patient Details Name: Nicole [name] MRN: [id number] DOB: [birth year] Today's Date: 03/26/2018    History of present illness Pt is an 84 y.o. female admitted 03/17/18 with worsening SOB; worked up for CHF exacerbation. Rapid response called 8/28 due to unresponsiveness; suspected seizure activity. EEG consistent with encephaloapthy. CT 8/28 showed R superior thalamus acute hemorrhage, no associated mass effect; multiple chronic infarcts. Negative intracranial MRA for large vessel occlusion. PMH includes CHF, HTN, CKD, a-fib/flutter, DM, colon cancer, AAA, CVA, dementia.   OT comments  Pt sat EOB with mod A to complete grooming task with significant posterior lean. Perseveration noted. Pt progressing toward goals. Continue to recommend rehab at [facility]. Will follow acutely.   Follow Up Recommendations  SNF;Supervision/Assistance - 24 hour    Equipment Recommendations  Other (comment)    Recommendations for Other Services      Precautions / Restrictions Precautions Precautions: Fall       Mobility Bed Mobility Overal bed mobility: Needs Assistance       Supine to sit: Min assist Sit to supine: Mod assist      Transfers                      Balance     Sitting balance-Leahy Scale: Poor Sitting balance - Comments: posterior lean                                   ADL either performed or assessed with clinical judgement   ADL Overall ADL's : Needs assistance/impaired     Grooming: Minimal assistance Grooming Details (indicate cue type and reason): continued vc                               General ADL Comments: Pt sat EOB with mod A to maintain midline postural control with significant posterior lean while completing grooming task     Vision       Perception     Praxis      Cognition Arousal/Alertness: Awake/alert Behavior During Therapy: Restless Overall Cognitive Status: History of cognitive  impairments - at baseline                                          Exercises     Shoulder Instructions       General Comments      Pertinent Vitals/ Pain       Pain Assessment: Faces Faces Pain Scale: No hurt  Home Living                                          Prior Functioning/Environment              Frequency  Min 2X/week        Progress Toward Goals  OT Goals(current goals can now be found in the care plan section)  Progress towards OT goals: Progressing toward goals  Acute Rehab OT Goals Patient Stated Goal: none stated OT Goal Formulation: Patient unable to participate in goal setting Time For Goal Achievement: 03/31/18 Potential to Achieve Goals: Fair ADL Goals Pt Will Perform Eating: with set-up;with supervision Pt Will  Perform Grooming: with set-up;with supervision Pt Will Perform Upper Body Bathing: with set-up;with supervision Pt Will Perform Lower Body Bathing: with supervision;with set-up  Plan Discharge plan remains appropriate    Co-evaluation                 AM-PAC PT "6 Clicks" Daily Activity     Outcome Measure   Help from another person eating meals?: A Lot Help from another person taking care of personal grooming?: A Lot Help from another person toileting, which includes using toliet, bedpan, or urinal?: A Lot Help from another person bathing (including washing, rinsing, drying)?: A Lot Help from another person to put on and taking off regular upper body clothing?: A Lot Help from another person to put on and taking off regular lower body clothing?: A Lot 6 Click Score: 12    End of Session    OT Visit Diagnosis: Unsteadiness on feet (R26.81);Muscle weakness (generalized) (M62.81);Other symptoms and signs involving cognitive function   Activity Tolerance Patient tolerated treatment well   Patient Left in bed;with bed alarm set;with call bell/phone within reach;with family/visitor  present   Nurse Communication Mobility status        Time: 8921-1941 OT Time Calculation (min): 20 min  Charges: OT General Charges $OT Visit: 1 Visit OT Treatments $Self Care/Home Management : 8-22 mins  Maurie Boettcher, OT/L  OT Clinical Specialist [phone]    [facility] 03/26/2018, 5:04 PM

## 2018-03-26 NOTE — Progress Notes (Signed)
  Speech Language Pathology Treatment: Dysphagia  Patient Details Name: Nicole [name] MRN: [id number] DOB: Dec 25, 1930 Today's Date: 03/26/2018 Time: 8979-1504 SLP Time Calculation (min) (ACUTE ONLY): 18 min  Assessment / Plan / Recommendation Clinical Impression  PO intake was limited by cognitive status. Pt consumed one sip of water via straw, administered by SLP, with immediate throat clearing and delayed coughing that ensued. SLP attempted various other textures (solids and liquids) but with pt refusing all of it. She repeatedly asked for water, even though the water was in her hand. SLP provided Max cues for redirection, but no further intake occurred. Recommend continuing with current diet for now with additional SLP f/u at SNF.   HPI HPI: 84 year old female admitted 03/17/18 with worsening SOB. DC'd 3 weeks ago with similar dx. PMH: chronic hypoxic respiratory failure, 3LNC at home, dCHF, AFib/flutter, DM, dementia, pleural effusion, CKD, colon cancer, SBO, CVA, thoacic aneurysm without rupture.      SLP Plan  Continue with current plan of care       Recommendations  Diet recommendations: Dysphagia 1 (puree);Nectar-thick liquid Liquids provided via: Cup;Straw Medication Administration: Crushed with puree Supervision: Staff to assist with self feeding;Full supervision/cueing for compensatory strategies Compensations: Minimize environmental distractions;Slow rate;Small sips/bites Postural Changes and/or Swallow Maneuvers: Seated upright 90 degrees                Oral Care Recommendations: Oral care BID Follow up Recommendations: Skilled Nursing facility SLP Visit Diagnosis: Dysphagia, unspecified (R13.10) Plan: Continue with current plan of care       GO                Germain Osgood 03/26/2018, 4:54 PM  Germain Osgood, M.A. [place] Acute Environmental education officer [phone] Office [phone]

## 2018-03-26 NOTE — Discharge Summary (Signed)
Physician Discharge Summary  Nicole [name] MWU:[id number] DOB: 27-May-1931 DOA: 03/17/2018  PCP: [name], DO  Admit date: 03/17/2018 Discharge date: 03/26/2018  Admitted From: home Disposition:  SNF  Recommendations for Outpatient Follow-up:  1. Follow up with PCP in 1-2 weeks 2. Follow up with Neurology in 6 weeks  Home Health: none Equipment/Devices: none  Discharge Condition: stable CODE STATUS: DNR Diet recommendation: Dysphagia 1 (puree);Nectar-thick liquid  HPI: Per Dr. Lorin Mercy, Nicole [name] is a 84 y.o. female with a Past Medical History ofCVA; hypothyroidism; HTN; HLD; DM; afib; and chronic diastolic CHF (preserved EF and no comment on diastolic function on echo in 3/19)  who presents with intermittent SOB.  She reports primarily DOE and orthopnea.  BNP is 584 and CXR shows pulmonary edema with increased moderate R and small L pleural effusions.   She had a prior similar admission within the last month.  Hospital Course: Acute CVA complicated with seizuresand acute hypoxic respiratory failure-Patient was found to have a subacute small right thalamic ICH, likely combination of hypertension and cerebral amyloid angiopathy.  Neurology was consulted and followed patient while hospitalized.  She underwent a 2D echo which showed an EF of 66 5%.  Her LDL is 156, A1c was 5.1.  Given ICH, she was placed on SCD for VT prophylaxis and avoid antithrombotics due to hypertensive hemorrhages.  PT evaluated patient and recommended SNF on discharge.  Continue seizure prophylaxis with lacosamide 75 mg p.o. twice daily Acute on chronicdiastolicheart failure exacerbation with bilateral pleural effusions, more right than left. spthoracentesis, 1.3 Liters -She currently looks euvolemic, recommend to continue a low-salt diet Chronic atrial fibrillation -Continue ratecontrolled withdiltiazem and metoprolol,holding onanticoagulation due to intracranial bleed. HTN -Continue  withdiltiazem and metoprolol T2DM -Diet controlled Hypothyroid -On levothyroxine.  Dementia -Continuedonepezil and sertraline. Severe calorie protein malnutrition -Continuenutritional supplements. Pre -renalAKI on CKD stage 3with hyeprnatremia-Improved with hydration, discontinue IV fluids to prevent fluid overload> Encourage po intake  Discharge Diagnoses:  Principal Problem:   Acute respiratory distress Active Problems:   Anemia   Acute on chronic systolic heart failure (HCC)   Essential hypertension   Bradyarrhythmia   DNR (do not resuscitate)   Diabetes mellitus ([place])   Anxiety   CAD (coronary artery disease)   Pleural effusion   Intracerebral hemorrhage   Protein-calorie malnutrition, severe     Discharge Instructions  Discharge Instructions    Ambulatory referral to Neurology   Complete by:  As directed    Follow up with Dr. Leonie Man at [facility] in 6 weeks. Too complicated for RN to follow. Thanks.     Allergies as of 03/26/2018      Reactions   Iohexol Nausea And Vomiting   Iodinated Diagnostic Agents Nausea And Vomiting   Z-pak [azithromycin] Other (See Comments)   Reaction unknown by family      Medication List    STOP taking these medications   aspirin 81 MG EC tablet     TAKE these medications   acetaminophen 650 MG CR tablet Commonly known as:  TYLENOL Take 650 mg by mouth every 8 (eight) hours as needed for pain.   allopurinol 100 MG tablet Commonly known as:  ZYLOPRIM TAKE 1 TABLET BY MOUTH EVERY DAY   diltiazem 30 MG tablet Commonly known as:  CARDIZEM Take 1 tablet (30 mg total) by mouth 2 (two) times daily.   donepezil 10 MG tablet Commonly known as:  ARICEPT Take 1 tablet (10 mg total) by mouth at bedtime.  ferrous sulfate 325 (65 FE) MG EC tablet TAKE 1 TABLET (325 MG TOTAL) BY MOUTH 2 (TWO) TIMES DAILY WITH A MEAL.   furosemide 20 MG tablet Commonly known as:  LASIX Take 1 tablet (20 mg total) by mouth daily as needed for  fluid or edema. What changed:    when to take this  reasons to take this  Another medication with the same name was removed. Continue taking this medication, and follow the directions you see here.   ipratropium 0.02 % nebulizer solution Commonly known as:  ATROVENT Take 2.5 mLs (0.5 mg total) by nebulization every 6 (six) hours as needed for wheezing or shortness of breath.   isosorbide mononitrate 30 MG 24 hr tablet Commonly known as:  IMDUR TAKE 1 TABLET (30 MG TOTAL) BY MOUTH 2 (TWO) TIMES DAILY.   lacosamide 50 MG Tabs tablet Commonly known as:  VIMPAT Take 1.5 tablets (75 mg total) by mouth 2 (two) times daily.   levothyroxine 25 MCG tablet Commonly known as:  SYNTHROID, LEVOTHROID Take 1 tablet (25 mcg total) by mouth daily before breakfast.   metoprolol succinate 25 MG 24 hr tablet Commonly known as:  TOPROL-XL Take 1 tablet (25 mg total) by mouth daily.   OXYGEN Inhale 3 L into the lungs continuous.   potassium chloride 10 MEQ tablet Commonly known as:  K-DUR Take 1 tablet (10 mEq total) by mouth daily.   RESOURCE THICKENUP CLEAR Powd 1 packet   sertraline 50 MG tablet Commonly known as:  ZOLOFT Take 50 mg by mouth daily.       Contact information for follow-up providers    Garvin Fila, MD. Schedule an appointment as soon as possible for a visit in 6 week(s).   Specialties:  Neurology, Radiology Contact information: [address] [place] [address] [phone]            Contact information for after-discharge care    Destination    [facility] .   Service:  Skilled Nursing Contact information: [address] [place] [phone]                  Consultations:  Neurology   Procedures/Studies:  2D echo:  Impressions: - The patient was in atrial fibrillation. Normal LV size withmoderate LV hypertrophy. EF 60-65%. Mild to moderate RV dilationwith normal  systolic function. Mild MR, moderate aorticinsufficiency. Moderate pulmonary hypertension. Biatrialenlargement.   EEG: This is an abnormal EEG secondary to general background slowing. This finding may be seen with a diffuse disturbance that is etiologically nonspecific, but may include a metabolic encephalopathy or post-ictal state, among other possibilities.There was a short period when some triphasic wave activity was noted as well which again is consistent with encephalopathy. Clinical correlation recommended.   Dg Chest 1 View  Result Date: 03/19/2018 CLINICAL DATA:  84 year old female status post ultrasound-guided thoracentesis this morning. EXAM: CHEST  1 VIEW COMPARISON:  Portable chest 03/17/2018 and earlier. FINDINGS: Portable AP upright view at substantially regressed veiling opacity at the right lung base with improved ventilation since yesterday. No pneumothorax identified. Stable smaller veiling opacity at the left lung base compatible with small left effusion. Cardiomegaly and mediastinal contours are stable. Calcified aortic atherosclerosis. No pulmonary edema or areas of worsening ventilation. IMPRESSION: 1. Improved right lung base ventilation and no pneumothorax following right side thoracentesis. 2. Stable small left pleural effusion. No new cardiopulmonary abnormality. Electronically Signed   By: Genevie Ann  M.D.   On: 03/19/2018 09:54   Dg Chest 1 View  Result Date: 02/26/2018 CLINICAL DATA:  Post RIGHT thoracentesis. EXAM: CHEST  1 VIEW 5:21 p.m.: COMPARISON:  Chest x-ray earlier same day 8:25 a.m. and previously. FINDINGS: No pneumothorax after RIGHT thoracentesis. Small residual RIGHT pleural effusion, much improved since earlier today. Stable moderate-sized LEFT pleural effusion. Improved aeration in the RIGHT LOWER LOBE, with mild to moderate atelectasis persisting. Stable dense passive atelectasis involving the LEFT LOWER LOBE. Stable pulmonary venous hypertension without  overt edema. Stable moderate to marked cardiomegaly. IMPRESSION: 1. No pneumothorax after RIGHT thoracentesis. 2. Small residual RIGHT pleural effusion after thoracentesis. 3. Improved aeration in the RIGHT LOWER LOBE with mild to moderate atelectasis persisting. 4. Stable moderate size LEFT pleural effusion and associated dense passive atelectasis involving the LEFT LOWER LOBE. Electronically Signed   By: Evangeline Dakin M.D.   On: 02/26/2018 17:30   Dg Chest 2 View  Result Date: 02/28/2018 CLINICAL DATA:  Follow-up pleural effusions EXAM: CHEST - 2 VIEW COMPARISON:  02/26/2018 FINDINGS: Cardiac shadow remains enlarged. Bilateral pleural effusions are again identified and stable. Mild central vascular congestion is noted. No pneumothorax is seen. IMPRESSION: Small bilateral pleural effusions. Mild vascular congestion is noted. Electronically Signed   By: Inez Catalina M.D.   On: 02/28/2018 11:55   Mr Virgel Paling SW Contrast  Result Date: 03/19/2018 CLINICAL DATA:  Follow-up examination for acute stroke, intracranial hemorrhage. EXAM: MRI HEAD WITHOUT CONTRAST MRA HEAD WITHOUT CONTRAST TECHNIQUE: Multiplanar, multiecho pulse sequences of the brain and surrounding structures were obtained without intravenous contrast. Angiographic images of the head were obtained using MRA technique without contrast. COMPARISON:  Prior CT from earlier the same day as well as previous MRI from 05/23/2017. FINDINGS: MRI HEAD FINDINGS Brain: Moderately advanced cerebral atrophy with advanced chronic microvascular ischemic disease, similar to previous. Multiple scatter remote lacunar infarcts present within the bilateral basal ganglia and thalami. Chronic microvascular ischemic changes present within the pons. Probable small remote cortical infarcts involving the left frontal and parietal cortices. Multiple foci of chronic hemosiderin staining seen about several of these infarcts, with additional foci seen scattered throughout the  supratentorial and infratentorial brain. Changes could be hypertensive in nature or related to cerebral amyloid angiopathy or a combination there of. Focal susceptibility artifacts seen associated with the previously identified superior right thalamic hemorrhage. No significant mass effect or edema. No other evidence for acute or subacute infarct. Gray-white matter differentiation otherwise maintained. No other evidence for acute intracranial hemorrhage. No mass lesion, midline shift or mass effect. Diffuse ventricular prominence related to global parenchymal volume loss of hydrocephalus. No extra-axial fluid collection. Pituitary gland normal. Vascular: Major intracranial vascular flow voids are maintained. Skull and upper cervical spine: Craniocervical junction normal. Multilevel degenerative spondylolysis noted within the upper cervical spine without significant stenosis. Bone marrow signal intensity normal. A scalp soft tissue abnormality. Sinuses/Orbits: Patient status post bilateral ocular lens replacement. Left sphenoid sinus retention cyst. Paranasal sinuses and mastoid air cells are otherwise clear. Inner ear structures grossly normal. Other: None. MRA HEAD FINDINGS ANTERIOR CIRCULATION: Distal cervical segments of the internal carotid arteries are patent with antegrade flow. Petrous segments patent bilaterally. Mild atheromatous irregularity within the cavernous/supraclinoid ICAs without hemodynamically significant stenosis. A1 segments patent bilaterally. Normal anterior communicating artery. Anterior cerebral arteries demonstrate mild atheromatous irregularity but are patent to their distal aspects without high-grade stenosis. M1 segments patent bilaterally without stenosis. No proximal M2 occlusion. Distal small vessel atheromatous irregularity involving the  MCA branches bilaterally. POSTERIOR CIRCULATION: Vertebral arteries patent to the vertebrobasilar junction without stenosis. Partially visualized  posterior inferior cerebral arteries patent bilaterally. Basilar patent to its distal aspect without stenosis. Superior cerebral arteries patent bilaterally. Predominant fetal type origin of the PCAs bilaterally supplied via widely patent posterior communicating arteries. P2 segments widely patent bilaterally. Distal PCA branches not well seen on this exam, which may be related to high-grade stenoses and/or occlusion. No intracranial aneurysm. IMPRESSION: MRI HEAD IMPRESSION: 1. Stable 5 mm superior right thalamic intraparenchymal hemorrhage without mass effect. 2. No other acute intracranial abnormality identified. 3. Advanced atrophy with chronic small vessel ischemic disease with multiple chronic infarcts as above. 4. Innumerable chronic micro hemorrhages involving the supratentorial and infratentorial brain as above. Findings could reflect sequelae of chronic hypertensive encephalopathy and/or cerebral amyloid angiopathy (or a combination there of). MRA HEAD IMPRESSION: 1. Negative intracranial MRA for large vessel occlusion. No proximal high-grade or correctable stenosis identified. 2. Moderate distal small vessel atheromatous disease. Electronically Signed   By: Jeannine Boga M.D.   On: 03/19/2018 13:49   Mr Brain Wo Contrast  Result Date: 03/19/2018 CLINICAL DATA:  Follow-up examination for acute stroke, intracranial hemorrhage. EXAM: MRI HEAD WITHOUT CONTRAST MRA HEAD WITHOUT CONTRAST TECHNIQUE: Multiplanar, multiecho pulse sequences of the brain and surrounding structures were obtained without intravenous contrast. Angiographic images of the head were obtained using MRA technique without contrast. COMPARISON:  Prior CT from earlier the same day as well as previous MRI from 05/23/2017. FINDINGS: MRI HEAD FINDINGS Brain: Moderately advanced cerebral atrophy with advanced chronic microvascular ischemic disease, similar to previous. Multiple scatter remote lacunar infarcts present within the  bilateral basal ganglia and thalami. Chronic microvascular ischemic changes present within the pons. Probable small remote cortical infarcts involving the left frontal and parietal cortices. Multiple foci of chronic hemosiderin staining seen about several of these infarcts, with additional foci seen scattered throughout the supratentorial and infratentorial brain. Changes could be hypertensive in nature or related to cerebral amyloid angiopathy or a combination there of. Focal susceptibility artifacts seen associated with the previously identified superior right thalamic hemorrhage. No significant mass effect or edema. No other evidence for acute or subacute infarct. Gray-white matter differentiation otherwise maintained. No other evidence for acute intracranial hemorrhage. No mass lesion, midline shift or mass effect. Diffuse ventricular prominence related to global parenchymal volume loss of hydrocephalus. No extra-axial fluid collection. Pituitary gland normal. Vascular: Major intracranial vascular flow voids are maintained. Skull and upper cervical spine: Craniocervical junction normal. Multilevel degenerative spondylolysis noted within the upper cervical spine without significant stenosis. Bone marrow signal intensity normal. A scalp soft tissue abnormality. Sinuses/Orbits: Patient status post bilateral ocular lens replacement. Left sphenoid sinus retention cyst. Paranasal sinuses and mastoid air cells are otherwise clear. Inner ear structures grossly normal. Other: None. MRA HEAD FINDINGS ANTERIOR CIRCULATION: Distal cervical segments of the internal carotid arteries are patent with antegrade flow. Petrous segments patent bilaterally. Mild atheromatous irregularity within the cavernous/supraclinoid ICAs without hemodynamically significant stenosis. A1 segments patent bilaterally. Normal anterior communicating artery. Anterior cerebral arteries demonstrate mild atheromatous irregularity but are patent to their  distal aspects without high-grade stenosis. M1 segments patent bilaterally without stenosis. No proximal M2 occlusion. Distal small vessel atheromatous irregularity involving the MCA branches bilaterally. POSTERIOR CIRCULATION: Vertebral arteries patent to the vertebrobasilar junction without stenosis. Partially visualized posterior inferior cerebral arteries patent bilaterally. Basilar patent to its distal aspect without stenosis. Superior cerebral arteries patent bilaterally. Predominant fetal type origin of the PCAs bilaterally  supplied via widely patent posterior communicating arteries. P2 segments widely patent bilaterally. Distal PCA branches not well seen on this exam, which may be related to high-grade stenoses and/or occlusion. No intracranial aneurysm. IMPRESSION: MRI HEAD IMPRESSION: 1. Stable 5 mm superior right thalamic intraparenchymal hemorrhage without mass effect. 2. No other acute intracranial abnormality identified. 3. Advanced atrophy with chronic small vessel ischemic disease with multiple chronic infarcts as above. 4. Innumerable chronic micro hemorrhages involving the supratentorial and infratentorial brain as above. Findings could reflect sequelae of chronic hypertensive encephalopathy and/or cerebral amyloid angiopathy (or a combination there of). MRA HEAD IMPRESSION: 1. Negative intracranial MRA for large vessel occlusion. No proximal high-grade or correctable stenosis identified. 2. Moderate distal small vessel atheromatous disease. Electronically Signed   By: Jeannine Boga M.D.   On: 03/19/2018 13:49   Dg Chest Portable 1 View  Result Date: 03/17/2018 CLINICAL DATA:  Acute dyspnea. EXAM: PORTABLE CHEST 1 VIEW COMPARISON:  Chest radiograph February 28, 2018 FINDINGS: Increasing cardiomegaly. Calcified aortic arch. Pulmonary vascular congestion diffuse interstitial prominence. Increased moderate RIGHT, stable small LEFT pleural effusions with underlying consolidation. Scattered  calcified granulomas. No pneumothorax. Osteopenia. IMPRESSION: 1. Cardiomegaly with increasing pulmonary edema. 2. Increased moderate RIGHT and small LEFT pleural effusions. Underlying consolidation. 3.  Aortic Atherosclerosis (ICD10-I70.0). Electronically Signed   By: Elon Alas M.D.   On: 03/17/2018 14:58   Dg Chest Port 1 View  Result Date: 02/26/2018 CLINICAL DATA:  Shortness of breath. EXAM: PORTABLE CHEST 1 VIEW COMPARISON:  Radiograph February 12, 2018. FINDINGS: Stable cardiomegaly. Atherosclerosis of thoracic aorta is noted. No pneumothorax is noted. Increased bibasilar opacities are noted concerning for pleural effusions with associated atelectasis, right greater than left. Bony thorax is unremarkable. IMPRESSION: Increased bibasilar opacities are noted concerning for pleural effusions with associated atelectasis, right greater than left. Aortic Atherosclerosis (ICD10-I70.0). Electronically Signed   By: Marijo Conception, M.D.   On: 02/26/2018 08:54   Ct Head Code Stroke Wo Contrast  Result Date: 03/19/2018 CLINICAL DATA:  Code stroke.  84 y/o  F; nonverbal and unresponsive. EXAM: CT HEAD WITHOUT CONTRAST TECHNIQUE: Contiguous axial images were obtained from the base of the skull through the vertex without intravenous contrast. COMPARISON:  05/23/2017 MRI head.  06/29/2017 CT head. FINDINGS: Brain: 5 mm density within the right superior thalamus compatible with acute hemorrhage. No associated mass effect. No large stroke, extra-axial collection, hydrocephalus, or herniation. Advanced chronic microvascular ischemic changes and volume loss of the brain are stable from prior studies. Multiple chronic lacunar infarcts are present within the bilateral basal ganglia. Vascular: Calcific atherosclerosis of the carotid siphons. No hyperdense vessel identified. Skull: Normal. Negative for fracture or focal lesion. Sinuses/Orbits: No acute finding. Other: None. ASPECTS [facility] Stroke Program Early CT Score)  - Ganglionic level infarction (caudate, lentiform nuclei, internal capsule, insula, M1-M3 cortex): 7 - Supraganglionic infarction (M4-M6 cortex): 3 Total score (0-10 with 10 being normal): 10 IMPRESSION: 1. 5 mm right superior thalamus acute hemorrhage. No associated mass effect. 2. No additional acute intracranial abnormality identified. 3. ASPECTS is 10. 4. Stable advanced chronic microvascular ischemic changes and volume loss of the brain as well as multiple small basal ganglia chronic infarcts. These results were called by telephone at the time of interpretation on 03/19/2018 at 1:41 am to Dr. Rory Percy , who verbally acknowledged these results. Electronically Signed   By: Kristine Garbe M.D.   On: 03/19/2018 01:44   Ir Thoracentesis Asp Pleural Space W/img Guide  Result Date: 03/19/2018 INDICATION:  Congestive heart failure. Right pleural effusion. Request for therapeutic thoracentesis. EXAM: ULTRASOUND GUIDED RIGHT THORACENTESIS MEDICATIONS: 2% lidocaine 10 mL COMPLICATIONS: None immediate. PROCEDURE: An ultrasound guided thoracentesis was thoroughly discussed with the patient and questions answered. The benefits, risks, alternatives and complications were also discussed. The patient understands and wishes to proceed with the procedure. Written consent was obtained. Ultrasound was performed to localize and mark an adequate pocket of fluid in the right chest. The area was then prepped and draped in the normal sterile fashion. 1% Lidocaine was used for local anesthesia. Under ultrasound guidance a 19 gauge, 7-cm, Yueh catheter was introduced. Thoracentesis was performed. The catheter was removed and a dressing applied. FINDINGS: A total of approximately 1.3 L of bloody fluid was removed. IMPRESSION: Successful ultrasound guided right thoracentesis yielding 1.3 L of pleural fluid. No pneumothorax on post-procedure chest x-ray. Read by: Gareth Eagle, PA-C Electronically Signed   By: Marybelle Killings M.D.   On:  03/19/2018 10:40   Ir Thoracentesis Asp Pleural Space W/img Guide  Result Date: 02/27/2018 INDICATION: Progressive dyspnea. History of diastolic CHF, currently admitted to [facility] for exacerbation. Bilateral pleural effusions. Request for diagnostic and therapeutic thoracentesis. EXAM: ULTRASOUND GUIDED RIGHT THORACENTESIS MEDICATIONS: 15 mL 2% lidocaine. COMPLICATIONS: None immediate. PROCEDURE: An ultrasound guided thoracentesis was thoroughly discussed with the patient and questions answered. The benefits, risks, alternatives and complications were also discussed. The patient understands and wishes to proceed with the procedure. Written consent was obtained. Ultrasound was performed to localize and mark an adequate pocket of fluid in the right chest. The area was then prepped and draped in the normal sterile fashion. 2% Lidocaine was used for local anesthesia. Under ultrasound guidance a 6 Fr Safe-T-Centesis catheter was introduced. Thoracentesis was performed. The catheter was removed and a dressing applied. FINDINGS: A total of approximately 1.2L of serosanguineous fluid was removed. Samples were sent to the laboratory as requested by the clinical team. IMPRESSION: Successful ultrasound guided right thoracentesis yielding 1.2L of pleural fluid. Read by Candiss Norse, PA-C Electronically Signed   By: Markus Daft M.D.   On: 02/26/2018 17:33      Subjective: - no chest pain, shortness of breath, no abdominal pain, nausea or vomiting.   Discharge Exam: Vitals:   03/26/18 0520 03/26/18 0807  BP:  (!) 161/103  Pulse: 69 78  Resp:    Temp:  98.2 F (36.8 C)  SpO2: 94% 94%    General: Pt is alert, awake, not in acute distress Cardiovascular: RRR, S1/S2 +, no rubs, no gallops Respiratory: CTA bilaterally, no wheezing, no rhonchi Abdominal: Soft, NT, ND, bowel sounds + Extremities: no edema, no cyanosis    The results of significant diagnostics from this hospitalization (including imaging,  microbiology, ancillary and laboratory) are listed below for reference.     Microbiology: Recent Results (from the past 240 hour(s))  MRSA PCR Screening     Status: None   Collection Time: 03/17/18 10:12 PM  Result Value Ref Range Status   MRSA by PCR NEGATIVE NEGATIVE Final    Comment:        The GeneXpert MRSA Assay (FDA approved for NASAL specimens only), is one component of a comprehensive MRSA colonization surveillance program. It is not intended to diagnose MRSA infection nor to guide or monitor treatment for MRSA infections. Performed at [facility], [place] [address] [place], [place] [address]      Labs: BNP (last 3 results) Recent Labs    02/09/18 1239 02/26/18 3329  03/17/18 1421  BNP 365.9* 969.5* 878.6*   Basic Metabolic Panel: Recent Labs  Lab 03/22/18 0706 03/23/18 0244 03/24/18 0333 03/25/18 0300 03/26/18 0448  NA 145 146* 148* 144 141  K 4.8 3.7 3.9 4.1 4.4  CL 105 104 106 103 101  CO2 31 35* 35* 36* 34*  GLUCOSE 94 106* 93 123* 90  BUN 29* 28* 25* 29* 28*  CREATININE 1.53* 1.38* 1.41* 1.46* 1.39*  CALCIUM 8.9 9.0 9.1 8.8* 8.7*  MG 2.3  --   --   --   --    Liver Function Tests: No results for input(s): AST, ALT, ALKPHOS, BILITOT, PROT, ALBUMIN in the last 168 hours. No results for input(s): LIPASE, AMYLASE in the last 168 hours. No results for input(s): AMMONIA in the last 168 hours. CBC: Recent Labs  Lab 03/20/18 0406 03/22/18 0706 03/23/18 0244  WBC 6.2 7.8 8.0  NEUTROABS 4.3  --   --   HGB 11.8* 12.0 12.0  HCT 35.5* 36.2 36.3  MCV 80.5 81.9 81.4  PLT 211 201 224   Cardiac Enzymes: No results for input(s): CKTOTAL, CKMB, CKMBINDEX, TROPONINI in the last 168 hours. BNP: Invalid input(s): POCBNP CBG: Recent Labs  Lab 03/19/18 1853  GLUCAP 90   D-Dimer No results for input(s): DDIMER in the last 72 hours. Hgb A1c No results for input(s): HGBA1C in the last 72 hours. Lipid Profile No results for input(s): CHOL, HDL,  LDLCALC, TRIG, CHOLHDL, LDLDIRECT in the last 72 hours. Thyroid function studies No results for input(s): TSH, T4TOTAL, T3FREE, THYROIDAB in the last 72 hours.  Invalid input(s): FREET3 Anemia work up No results for input(s): VITAMINB12, FOLATE, FERRITIN, TIBC, IRON, RETICCTPCT in the last 72 hours. Urinalysis    Component Value Date/Time   COLORURINE YELLOW 02/14/2018 1635   APPEARANCEUR CLEAR 02/14/2018 1635   LABSPEC 1.010 02/14/2018 1635   PHURINE 5.0 02/14/2018 1635   GLUCOSEU NEGATIVE 02/14/2018 1635   HGBUR NEGATIVE 02/14/2018 1635   BILIRUBINUR NEGATIVE 02/14/2018 1635   KETONESUR NEGATIVE 02/14/2018 1635   PROTEINUR NEGATIVE 02/14/2018 1635   UROBILINOGEN 2.0 (H) 04/13/2015 1844   NITRITE NEGATIVE 02/14/2018 1635   LEUKOCYTESUR NEGATIVE 02/14/2018 1635   Sepsis Labs Invalid input(s): PROCALCITONIN,  WBC,  LACTICIDVEN   Time coordinating discharge: 40 minutes  SIGNED:  Marzetta Board, MD  [facility] 03/26/2018, 4:34 PM Pager [phone]  If 7PM-7AM, please contact night-coverage [email or web address] Password [email or web address]

## 2018-03-26 NOTE — Progress Notes (Signed)
Clonidine 0.1 mg po given per MD orders.

## 2018-03-26 NOTE — Progress Notes (Signed)
Physical Therapy Treatment Patient Details Name: Nicole [name] MRN: [id number] DOB: 1930/08/17 Today's Date: 03/26/2018    History of Present Illness Pt is an 84 y.o. female admitted 03/17/18 with worsening SOB; worked up for CHF exacerbation. Rapid response called 8/28 due to unresponsiveness; suspected seizure activity. EEG consistent with encephaloapthy. CT 8/28 showed R superior thalamus acute hemorrhage, no associated mass effect; multiple chronic infarcts. Negative intracranial MRA for large vessel occlusion. PMH includes CHF, HTN, CKD, a-fib/flutter, DM, colon cancer, AAA, CVA, dementia.    PT Comments    Pt incontinent of stool on entry, and PT assisted NT with bathing, with pt sitting EoB and standing. Pt requires min A for bed mobility to EoB, and minA for power up to RW. Pt able to stand with minA for steadying for 3 minutes for bathing. Pt given max verbal/tactile cuing for stepping toward HoB and pt replied " I can't walk". Despite taking steps to foot of bed. Pt then incontinent of stool. Sat back on EoB and again stood for pericare. Once cleaned pt fatigued and required maxAx2 for management back into bed. D/c plans remain appropriate.     Follow Up Recommendations  SNF     Equipment Recommendations  Other (comment)(TBD at next venue)    Recommendations for Other Services       Precautions / Restrictions Precautions Precautions: Fall Restrictions Weight Bearing Restrictions: No    Mobility  Bed Mobility Overal bed mobility: Needs Assistance Bed Mobility: Supine to Sit;Sit to Supine     Supine to sit: Min assist Sit to supine: Max assist;+2 for physical assistance   General bed mobility comments: min A for bringing trunk to upright and managing LE off bed, pt fatigued after standing and required maxAx2 for management of trunk and LE into bed   Transfers Overall transfer level: Needs assistance Equipment used: Rolling walker (2 wheeled) Transfers: Sit to/from  Stand Sit to Stand: Min assist         General transfer comment: min A for power up and steadying at RW for pericare x2   Ambulation/Gait             General Gait Details: gave max verbal cuing however pt refused to attempt lateral stepping      Balance Overall balance assessment: Needs assistance Sitting-balance support: Feet supported;No upper extremity supported Sitting balance-Leahy Scale: Poor Sitting balance - Comments: pt slumps backward and needs to be redirected to put feet on floor Postural control: Posterior lean Standing balance support: Bilateral upper extremity supported;During functional activity Standing balance-Leahy Scale: Poor Standing balance comment: requires B UE support on RW and minA from therapist                            Cognition Arousal/Alertness: Awake/alert Behavior During Therapy: Flat affect Overall Cognitive Status: History of cognitive impairments - at baseline Area of Impairment: Orientation;Attention;Memory;Following commands;Safety/judgement;Awareness;Problem solving                 Orientation Level: Disoriented to;Place;Situation;Time Current Attention Level: Sustained Memory: Decreased short-term memory Following Commands: Follows one step commands inconsistently Safety/Judgement: Decreased awareness of safety;Decreased awareness of deficits Awareness: Intellectual Problem Solving: Slow processing;Decreased initiation;Difficulty sequencing;Requires verbal cues;Requires tactile cues General Comments: pt only oriented to self      Exercises General Exercises - Upper Extremity Elbow Flexion: AROM;Both;10 reps;Supine;AAROM    General Comments General comments (skin integrity, edema, etc.): VSS      Pertinent  Vitals/Pain Pain Assessment: No/denies pain           PT Goals (current goals can now be found in the care plan section) Acute Rehab PT Goals Patient Stated Goal: none stated PT Goal  Formulation: Patient unable to participate in goal setting Time For Goal Achievement: 04/03/18 Potential to Achieve Goals: Fair Progress towards PT goals: Progressing toward goals    Frequency    Min 2X/week      PT Plan Current plan remains appropriate       AM-PAC PT "6 Clicks" Daily Activity  Outcome Measure  Difficulty turning over in bed (including adjusting bedclothes, sheets and blankets)?: Unable Difficulty moving from lying on back to sitting on the side of the bed? : Unable Difficulty sitting down on and standing up from a chair with arms (e.g., wheelchair, bedside commode, etc,.)?: Unable Help needed moving to and from a bed to chair (including a wheelchair)?: Total Help needed walking in hospital room?: Total Help needed climbing 3-5 steps with a railing? : Total 6 Click Score: 6    End of Session Equipment Utilized During Treatment: Gait belt Activity Tolerance: Patient limited by fatigue Patient left: with call bell/phone within reach;in bed;with bed alarm set Nurse Communication: Mobility status PT Visit Diagnosis: Other abnormalities of gait and mobility (R26.89)     Time: 4373-5789 PT Time Calculation (min) (ACUTE ONLY): 33 min  Charges:  $Therapeutic Activity: 23-37 mins                     Teiara Baria B. Migdalia Dk PT, DPT Acute Rehabilitation Services Pager [phone] Office [phone]    [place] 03/26/2018, 1:16 PM

## 2018-03-26 NOTE — Progress Notes (Signed)
Tried calling report to [employer].  Tried multiple times to give report at different numbers.

## 2018-03-26 NOTE — Social Work (Signed)
CSW informed that Nicole [name] has received authorization for pt to d/c to SNF. Updated MD for discharge summary. CSW will arrange transport.   Clinical Social Worker facilitated patient discharge including contacting patient family and facility to confirm patient discharge plans.  Clinical information faxed to facility and family agreeable with plan.  CSW arranged ambulance transport via [facility] to [place] to call [phone] with report  prior to discharge.  Clinical Social Worker will sign off for now as social work intervention is no longer needed. Please consult [place] again if new need arises.  Alexander Mt, [place] Social Worker

## 2018-03-26 NOTE — Social Work (Signed)
Continue to follow, insurance authorization through [employer] is pending with [employer]. Pt family member [name] aware.   Alexander Mt, [place] Work [phone]

## 2018-03-26 NOTE — Progress Notes (Signed)
Pt discharged via stretcher to ambulance for transport by [facility] to [facility].  Will try again to call report.  No s/s of distress. Respirations even and unlabored.

## 2018-03-27 ENCOUNTER — Ambulatory Visit: Payer: Medicare HMO | Admitting: Internal Medicine

## 2018-03-27 DIAGNOSIS — I639 Cerebral infarction, unspecified: Secondary | ICD-10-CM | POA: Diagnosis not present

## 2018-03-27 DIAGNOSIS — I5033 Acute on chronic diastolic (congestive) heart failure: Secondary | ICD-10-CM | POA: Diagnosis not present

## 2018-03-27 DIAGNOSIS — I10 Essential (primary) hypertension: Secondary | ICD-10-CM | POA: Diagnosis not present

## 2018-03-27 DIAGNOSIS — R569 Unspecified convulsions: Secondary | ICD-10-CM | POA: Diagnosis not present

## 2018-03-28 DIAGNOSIS — I639 Cerebral infarction, unspecified: Secondary | ICD-10-CM | POA: Diagnosis not present

## 2018-03-28 DIAGNOSIS — R69 Illness, unspecified: Secondary | ICD-10-CM | POA: Diagnosis not present

## 2018-03-28 DIAGNOSIS — I10 Essential (primary) hypertension: Secondary | ICD-10-CM | POA: Diagnosis not present

## 2018-03-28 DIAGNOSIS — R569 Unspecified convulsions: Secondary | ICD-10-CM | POA: Diagnosis not present

## 2018-03-30 LAB — FUNGUS CULTURE WITH STAIN

## 2018-03-30 LAB — FUNGAL ORGANISM REFLEX

## 2018-03-30 LAB — FUNGUS CULTURE RESULT

## 2018-03-31 ENCOUNTER — Encounter: Payer: Self-pay | Admitting: *Deleted

## 2018-04-02 DIAGNOSIS — I482 Chronic atrial fibrillation: Secondary | ICD-10-CM | POA: Diagnosis not present

## 2018-04-02 DIAGNOSIS — D509 Iron deficiency anemia, unspecified: Secondary | ICD-10-CM | POA: Diagnosis not present

## 2018-04-03 DIAGNOSIS — I482 Chronic atrial fibrillation: Secondary | ICD-10-CM | POA: Diagnosis not present

## 2018-04-03 DIAGNOSIS — R296 Repeated falls: Secondary | ICD-10-CM | POA: Diagnosis not present

## 2018-04-06 DIAGNOSIS — I639 Cerebral infarction, unspecified: Secondary | ICD-10-CM | POA: Diagnosis not present

## 2018-04-06 DIAGNOSIS — R69 Illness, unspecified: Secondary | ICD-10-CM | POA: Diagnosis not present

## 2018-04-06 DIAGNOSIS — I10 Essential (primary) hypertension: Secondary | ICD-10-CM | POA: Diagnosis not present

## 2018-04-06 DIAGNOSIS — R569 Unspecified convulsions: Secondary | ICD-10-CM | POA: Diagnosis not present

## 2018-04-07 DIAGNOSIS — F0390 Unspecified dementia without behavioral disturbance: Secondary | ICD-10-CM | POA: Diagnosis not present

## 2018-04-07 DIAGNOSIS — R296 Repeated falls: Secondary | ICD-10-CM | POA: Diagnosis not present

## 2018-04-07 DIAGNOSIS — R69 Illness, unspecified: Secondary | ICD-10-CM | POA: Diagnosis not present

## 2018-04-09 DIAGNOSIS — I5033 Acute on chronic diastolic (congestive) heart failure: Secondary | ICD-10-CM | POA: Diagnosis not present

## 2018-04-09 DIAGNOSIS — I10 Essential (primary) hypertension: Secondary | ICD-10-CM | POA: Diagnosis not present

## 2018-04-09 DIAGNOSIS — R69 Illness, unspecified: Secondary | ICD-10-CM | POA: Diagnosis not present

## 2018-04-10 DIAGNOSIS — I5033 Acute on chronic diastolic (congestive) heart failure: Secondary | ICD-10-CM | POA: Diagnosis not present

## 2018-04-10 DIAGNOSIS — J90 Pleural effusion, not elsewhere classified: Secondary | ICD-10-CM | POA: Diagnosis not present

## 2018-04-10 DIAGNOSIS — I10 Essential (primary) hypertension: Secondary | ICD-10-CM | POA: Diagnosis not present

## 2018-04-11 DIAGNOSIS — R69 Illness, unspecified: Secondary | ICD-10-CM | POA: Diagnosis not present

## 2018-04-11 DIAGNOSIS — I482 Chronic atrial fibrillation: Secondary | ICD-10-CM | POA: Diagnosis not present

## 2018-04-11 DIAGNOSIS — I5033 Acute on chronic diastolic (congestive) heart failure: Secondary | ICD-10-CM | POA: Diagnosis not present

## 2018-04-11 DIAGNOSIS — I10 Essential (primary) hypertension: Secondary | ICD-10-CM | POA: Diagnosis not present

## 2018-04-14 DIAGNOSIS — I10 Essential (primary) hypertension: Secondary | ICD-10-CM | POA: Diagnosis not present

## 2018-04-14 DIAGNOSIS — I5033 Acute on chronic diastolic (congestive) heart failure: Secondary | ICD-10-CM | POA: Diagnosis not present

## 2018-04-14 DIAGNOSIS — N183 Chronic kidney disease, stage 3 (moderate): Secondary | ICD-10-CM | POA: Diagnosis not present

## 2018-04-14 DIAGNOSIS — E119 Type 2 diabetes mellitus without complications: Secondary | ICD-10-CM | POA: Diagnosis not present

## 2018-04-16 ENCOUNTER — Telehealth: Payer: Self-pay

## 2018-04-16 NOTE — Telephone Encounter (Signed)
Transition Care Management Follow-up Telephone Call  Date of discharge and from where: [facility] on 04/15/2018  How have you been since you were released from the hospital? Per patient daughter she is doing very well and has had no problems since she has been back home  Any questions or concerns? No   Items Reviewed:  Did the pt receive and understand the discharge instructions provided? Yes   Medications obtained and verified? Yes   Any new allergies since your discharge? No   Dietary orders reviewed? Yes  Do you have support at home? Yes has a caregiver daily  Other (ie: DME, Home Health, etc) O2  Functional Questionnaire: (I = Independent and D = Dependent) ADL's: I with assist  Bathing/Dressing- I with assist   Meal Prep- D  Eating- I  Maintaining continence- I  Transferring/Ambulation- Wheelchair or walker  Managing Meds- D   Follow up appointments reviewed:    PCP Hospital f/u appt confirmed? Yes  Scheduled to see Dr. Mariea Clonts on 04/24/2018 @ 11:30am.  [facility] f/u appt confirmed? N/A  Are transportation arrangements needed? No   If their condition worsens, is the pt aware to call  their PCP or go to the ED? Yes  Was the patient provided with contact information for the PCP's office or ED? Yes  Was the pt encouraged to call back with questions or concerns? Yes

## 2018-04-17 ENCOUNTER — Inpatient Hospital Stay (HOSPITAL_COMMUNITY)
Admission: EM | Admit: 2018-04-17 | Discharge: 2018-04-25 | DRG: 291 | Disposition: A | Discharge status: Hospice | Payer: Medicare HMO | Attending: Internal Medicine | Admitting: Internal Medicine

## 2018-04-17 ENCOUNTER — Encounter (HOSPITAL_COMMUNITY): Payer: Self-pay | Admitting: Emergency Medicine

## 2018-04-17 ENCOUNTER — Emergency Department (HOSPITAL_COMMUNITY): Payer: Medicare HMO

## 2018-04-17 DIAGNOSIS — E559 Vitamin D deficiency, unspecified: Secondary | ICD-10-CM | POA: Diagnosis present

## 2018-04-17 DIAGNOSIS — I160 Hypertensive urgency: Secondary | ICD-10-CM | POA: Diagnosis present

## 2018-04-17 DIAGNOSIS — E1122 Type 2 diabetes mellitus with diabetic chronic kidney disease: Secondary | ICD-10-CM | POA: Diagnosis present

## 2018-04-17 DIAGNOSIS — Z9981 Dependence on supplemental oxygen: Secondary | ICD-10-CM

## 2018-04-17 DIAGNOSIS — Z8249 Family history of ischemic heart disease and other diseases of the circulatory system: Secondary | ICD-10-CM | POA: Diagnosis not present

## 2018-04-17 DIAGNOSIS — J90 Pleural effusion, not elsewhere classified: Secondary | ICD-10-CM

## 2018-04-17 DIAGNOSIS — Z79890 Hormone replacement therapy (postmenopausal): Secondary | ICD-10-CM | POA: Diagnosis not present

## 2018-04-17 DIAGNOSIS — E11649 Type 2 diabetes mellitus with hypoglycemia without coma: Secondary | ICD-10-CM | POA: Diagnosis present

## 2018-04-17 DIAGNOSIS — Z6821 Body mass index (BMI) 21.0-21.9, adult: Secondary | ICD-10-CM | POA: Diagnosis not present

## 2018-04-17 DIAGNOSIS — E1142 Type 2 diabetes mellitus with diabetic polyneuropathy: Secondary | ICD-10-CM | POA: Diagnosis present

## 2018-04-17 DIAGNOSIS — E876 Hypokalemia: Secondary | ICD-10-CM | POA: Diagnosis present

## 2018-04-17 DIAGNOSIS — E43 Unspecified severe protein-calorie malnutrition: Secondary | ICD-10-CM | POA: Diagnosis present

## 2018-04-17 DIAGNOSIS — J9621 Acute and chronic respiratory failure with hypoxia: Secondary | ICD-10-CM | POA: Diagnosis not present

## 2018-04-17 DIAGNOSIS — I5033 Acute on chronic diastolic (congestive) heart failure: Secondary | ICD-10-CM | POA: Diagnosis present

## 2018-04-17 DIAGNOSIS — Z515 Encounter for palliative care: Secondary | ICD-10-CM | POA: Diagnosis not present

## 2018-04-17 DIAGNOSIS — N179 Acute kidney failure, unspecified: Secondary | ICD-10-CM | POA: Diagnosis present

## 2018-04-17 DIAGNOSIS — R0602 Shortness of breath: Secondary | ICD-10-CM

## 2018-04-17 DIAGNOSIS — F0390 Unspecified dementia without behavioral disturbance: Secondary | ICD-10-CM | POA: Diagnosis present

## 2018-04-17 DIAGNOSIS — I130 Hypertensive heart and chronic kidney disease with heart failure and stage 1 through stage 4 chronic kidney disease, or unspecified chronic kidney disease: Secondary | ICD-10-CM | POA: Diagnosis not present

## 2018-04-17 DIAGNOSIS — I5031 Acute diastolic (congestive) heart failure: Secondary | ICD-10-CM | POA: Diagnosis not present

## 2018-04-17 DIAGNOSIS — E1121 Type 2 diabetes mellitus with diabetic nephropathy: Secondary | ICD-10-CM | POA: Diagnosis present

## 2018-04-17 DIAGNOSIS — R627 Adult failure to thrive: Secondary | ICD-10-CM | POA: Diagnosis present

## 2018-04-17 DIAGNOSIS — K219 Gastro-esophageal reflux disease without esophagitis: Secondary | ICD-10-CM | POA: Diagnosis present

## 2018-04-17 DIAGNOSIS — E7800 Pure hypercholesterolemia, unspecified: Secondary | ICD-10-CM | POA: Diagnosis present

## 2018-04-17 DIAGNOSIS — R0603 Acute respiratory distress: Secondary | ICD-10-CM | POA: Diagnosis present

## 2018-04-17 DIAGNOSIS — I4821 Permanent atrial fibrillation: Secondary | ICD-10-CM | POA: Diagnosis not present

## 2018-04-17 DIAGNOSIS — J9383 Other pneumothorax: Secondary | ICD-10-CM | POA: Diagnosis not present

## 2018-04-17 DIAGNOSIS — E039 Hypothyroidism, unspecified: Secondary | ICD-10-CM | POA: Diagnosis present

## 2018-04-17 DIAGNOSIS — I10 Essential (primary) hypertension: Secondary | ICD-10-CM | POA: Diagnosis present

## 2018-04-17 DIAGNOSIS — J918 Pleural effusion in other conditions classified elsewhere: Secondary | ICD-10-CM | POA: Diagnosis present

## 2018-04-17 DIAGNOSIS — R0600 Dyspnea, unspecified: Secondary | ICD-10-CM

## 2018-04-17 DIAGNOSIS — J939 Pneumothorax, unspecified: Secondary | ICD-10-CM | POA: Diagnosis not present

## 2018-04-17 DIAGNOSIS — N183 Chronic kidney disease, stage 3 (moderate): Secondary | ICD-10-CM | POA: Diagnosis not present

## 2018-04-17 DIAGNOSIS — I110 Hypertensive heart disease with heart failure: Secondary | ICD-10-CM | POA: Diagnosis not present

## 2018-04-17 DIAGNOSIS — R0902 Hypoxemia: Secondary | ICD-10-CM | POA: Diagnosis not present

## 2018-04-17 DIAGNOSIS — Z85038 Personal history of other malignant neoplasm of large intestine: Secondary | ICD-10-CM

## 2018-04-17 DIAGNOSIS — I272 Pulmonary hypertension, unspecified: Secondary | ICD-10-CM | POA: Diagnosis present

## 2018-04-17 DIAGNOSIS — Z66 Do not resuscitate: Secondary | ICD-10-CM | POA: Diagnosis present

## 2018-04-17 DIAGNOSIS — R69 Illness, unspecified: Secondary | ICD-10-CM | POA: Diagnosis not present

## 2018-04-17 DIAGNOSIS — R531 Weakness: Secondary | ICD-10-CM

## 2018-04-17 DIAGNOSIS — I509 Heart failure, unspecified: Secondary | ICD-10-CM | POA: Diagnosis not present

## 2018-04-17 DIAGNOSIS — Z79899 Other long term (current) drug therapy: Secondary | ICD-10-CM

## 2018-04-17 DIAGNOSIS — I5030 Unspecified diastolic (congestive) heart failure: Secondary | ICD-10-CM

## 2018-04-17 DIAGNOSIS — R569 Unspecified convulsions: Secondary | ICD-10-CM | POA: Diagnosis present

## 2018-04-17 DIAGNOSIS — D649 Anemia, unspecified: Secondary | ICD-10-CM | POA: Diagnosis present

## 2018-04-17 DIAGNOSIS — Z7189 Other specified counseling: Secondary | ICD-10-CM | POA: Diagnosis not present

## 2018-04-17 DIAGNOSIS — R32 Unspecified urinary incontinence: Secondary | ICD-10-CM | POA: Diagnosis present

## 2018-04-17 DIAGNOSIS — I482 Chronic atrial fibrillation, unspecified: Secondary | ICD-10-CM

## 2018-04-17 DIAGNOSIS — Z833 Family history of diabetes mellitus: Secondary | ICD-10-CM

## 2018-04-17 DIAGNOSIS — E785 Hyperlipidemia, unspecified: Secondary | ICD-10-CM | POA: Diagnosis present

## 2018-04-17 DIAGNOSIS — J948 Other specified pleural conditions: Secondary | ICD-10-CM | POA: Diagnosis not present

## 2018-04-17 LAB — URINALYSIS, ROUTINE W REFLEX MICROSCOPIC
Bilirubin Urine: NEGATIVE
GLUCOSE, UA: NEGATIVE mg/dL
HGB URINE DIPSTICK: NEGATIVE
Ketones, ur: NEGATIVE mg/dL
Leukocytes, UA: NEGATIVE
Nitrite: NEGATIVE
PH: 5 (ref 5.0–8.0)
Protein, ur: NEGATIVE mg/dL
SPECIFIC GRAVITY, URINE: 1.009 (ref 1.005–1.030)

## 2018-04-17 LAB — CBC WITH DIFFERENTIAL/PLATELET
Abs Immature Granulocytes: 0 10*3/uL (ref 0.0–0.1)
Basophils Absolute: 0 10*3/uL (ref 0.0–0.1)
Basophils Relative: 0 %
EOS ABS: 0 10*3/uL (ref 0.0–0.7)
EOS PCT: 0 %
HCT: 33.6 % — ABNORMAL LOW (ref 36.0–46.0)
HEMOGLOBIN: 11.3 g/dL — AB (ref 12.0–15.0)
Immature Granulocytes: 1 %
LYMPHS PCT: 17 %
Lymphs Abs: 1.3 10*3/uL (ref 0.7–4.0)
MCH: 27 pg (ref 26.0–34.0)
MCHC: 33.6 g/dL (ref 30.0–36.0)
MCV: 80.4 fL (ref 78.0–100.0)
MONO ABS: 0.7 10*3/uL (ref 0.1–1.0)
MONOS PCT: 9 %
Neutro Abs: 5.5 10*3/uL (ref 1.7–7.7)
Neutrophils Relative %: 73 %
Platelets: 232 10*3/uL (ref 150–400)
RBC: 4.18 MIL/uL (ref 3.87–5.11)
RDW: 16.4 % — AB (ref 11.5–15.5)
WBC: 7.6 10*3/uL (ref 4.0–10.5)

## 2018-04-17 LAB — T4, FREE: Free T4: 0.85 ng/dL (ref 0.82–1.77)

## 2018-04-17 LAB — COMPREHENSIVE METABOLIC PANEL
ALK PHOS: 123 U/L (ref 38–126)
ALT: 31 U/L (ref 0–44)
AST: 38 U/L (ref 15–41)
Albumin: 2.8 g/dL — ABNORMAL LOW (ref 3.5–5.0)
Anion gap: 12 (ref 5–15)
BUN: 26 mg/dL — AB (ref 8–23)
CALCIUM: 8.9 mg/dL (ref 8.9–10.3)
CHLORIDE: 107 mmol/L (ref 98–111)
CO2: 26 mmol/L (ref 22–32)
CREATININE: 1.76 mg/dL — AB (ref 0.44–1.00)
GFR calc Af Amer: 29 mL/min — ABNORMAL LOW (ref >60)
GFR calc non Af Amer: 25 mL/min — ABNORMAL LOW (ref >60)
Glucose, Bld: 111 mg/dL — ABNORMAL HIGH (ref 70–99)
Potassium: 4.5 mmol/L (ref 3.5–5.1)
SODIUM: 145 mmol/L (ref 135–145)
Total Bilirubin: 1.2 mg/dL (ref 0.3–1.2)
Total Protein: 6.6 g/dL (ref 6.5–8.1)

## 2018-04-17 LAB — BRAIN NATRIURETIC PEPTIDE: B Natriuretic Peptide: 2577 pg/mL — ABNORMAL HIGH (ref 0.0–100.0)

## 2018-04-17 LAB — TSH: TSH: 2.192 u[IU]/mL (ref 0.350–4.500)

## 2018-04-17 LAB — I-STAT TROPONIN, ED: Troponin i, poc: 0.03 ng/mL (ref 0.00–0.08)

## 2018-04-17 LAB — GLUCOSE, CAPILLARY
GLUCOSE-CAPILLARY: 180 mg/dL — AB (ref 70–99)
GLUCOSE-CAPILLARY: 113 mg/dL — AB (ref 70–99)

## 2018-04-17 MED ORDER — ISOSORBIDE MONONITRATE ER 30 MG PO TB24
30.0000 mg | ORAL_TABLET | Freq: Two times a day (BID) | ORAL | Status: DC
  Administered 2018-04-17 – 2018-04-25 (×16): 30 mg via ORAL
  Filled 2018-04-17 (×16): qty 1

## 2018-04-17 MED ORDER — ACETAMINOPHEN 325 MG PO TABS
650.0000 mg | ORAL_TABLET | Freq: Four times a day (QID) | ORAL | Status: DC | PRN
  Administered 2018-04-22 – 2018-04-25 (×2): 650 mg via ORAL
  Filled 2018-04-17 (×2): qty 2

## 2018-04-17 MED ORDER — DILTIAZEM HCL 30 MG PO TABS: 30.0000 mg | ORAL_TABLET | Freq: Two times a day (BID) | ORAL | Status: DC

## 2018-04-17 MED ORDER — NITROGLYCERIN 2 % TD OINT
1.0000 [in_us] | TOPICAL_OINTMENT | Freq: Four times a day (QID) | TRANSDERMAL | Status: DC
  Administered 2018-04-17 – 2018-04-19 (×7): 1 [in_us] via TOPICAL
  Filled 2018-04-17 (×2): qty 30

## 2018-04-17 MED ORDER — FERROUS SULFATE 325 (65 FE) MG PO TABS
325.0000 mg | ORAL_TABLET | Freq: Two times a day (BID) | ORAL | Status: DC
  Administered 2018-04-17 – 2018-04-25 (×16): 325 mg via ORAL
  Filled 2018-04-17 (×16): qty 1

## 2018-04-17 MED ORDER — ONDANSETRON HCL 4 MG/2ML IJ SOLN: 4.0000 mg | Freq: Four times a day (QID) | INTRAMUSCULAR | Status: DC | PRN

## 2018-04-17 MED ORDER — ACETAMINOPHEN 650 MG RE SUPP: 650.0000 mg | Freq: Four times a day (QID) | RECTAL | Status: DC | PRN

## 2018-04-17 MED ORDER — LABETALOL HCL 5 MG/ML IV SOLN
10.0000 mg | Freq: Once | INTRAVENOUS | Status: AC
  Administered 2018-04-17: 10 mg via INTRAVENOUS
  Filled 2018-04-17: qty 4

## 2018-04-17 MED ORDER — NITROGLYCERIN 2 % TD OINT
1.0000 [in_us] | TOPICAL_OINTMENT | Freq: Once | TRANSDERMAL | Status: AC
  Administered 2018-04-17: 1 [in_us] via TOPICAL
  Filled 2018-04-17: qty 1

## 2018-04-17 MED ORDER — LEVOTHYROXINE SODIUM 25 MCG PO TABS
25.0000 ug | ORAL_TABLET | Freq: Every day | ORAL | Status: DC
  Administered 2018-04-18 – 2018-04-25 (×8): 25 ug via ORAL
  Filled 2018-04-17 (×8): qty 1

## 2018-04-17 MED ORDER — INSULIN ASPART 100 UNIT/ML ~~LOC~~ SOLN
0.0000 [IU] | Freq: Three times a day (TID) | SUBCUTANEOUS | Status: DC
  Administered 2018-04-17: 2 [IU] via SUBCUTANEOUS
  Administered 2018-04-18: 3 [IU] via SUBCUTANEOUS

## 2018-04-17 MED ORDER — BISACODYL 5 MG PO TBEC: 5.0000 mg | DELAYED_RELEASE_TABLET | Freq: Every day | ORAL | Status: DC | PRN

## 2018-04-17 MED ORDER — FUROSEMIDE 10 MG/ML IJ SOLN
40.0000 mg | Freq: Three times a day (TID) | INTRAMUSCULAR | Status: DC
  Administered 2018-04-17 – 2018-04-19 (×5): 40 mg via INTRAVENOUS
  Filled 2018-04-17 (×5): qty 4

## 2018-04-17 MED ORDER — METOPROLOL SUCCINATE ER 25 MG PO TB24
25.0000 mg | ORAL_TABLET | Freq: Every day | ORAL | Status: DC
  Administered 2018-04-18: 25 mg via ORAL
  Filled 2018-04-17: qty 1

## 2018-04-17 MED ORDER — LACOSAMIDE 50 MG PO TABS
75.0000 mg | ORAL_TABLET | Freq: Two times a day (BID) | ORAL | Status: DC
  Administered 2018-04-17 – 2018-04-25 (×16): 75 mg via ORAL
  Filled 2018-04-17 (×16): qty 2

## 2018-04-17 MED ORDER — DONEPEZIL HCL 10 MG PO TABS
10.0000 mg | ORAL_TABLET | Freq: Every day | ORAL | Status: DC
  Administered 2018-04-17 – 2018-04-24 (×8): 10 mg via ORAL
  Filled 2018-04-17 (×8): qty 1

## 2018-04-17 MED ORDER — SERTRALINE HCL 50 MG PO TABS
50.0000 mg | ORAL_TABLET | Freq: Every day | ORAL | Status: DC
  Administered 2018-04-18 – 2018-04-25 (×8): 50 mg via ORAL
  Filled 2018-04-17 (×8): qty 1

## 2018-04-17 MED ORDER — HEPARIN SODIUM (PORCINE) 5000 UNIT/ML IJ SOLN
5000.0000 [IU] | Freq: Three times a day (TID) | INTRAMUSCULAR | Status: DC
  Administered 2018-04-17 – 2018-04-18 (×2): 5000 [IU] via SUBCUTANEOUS
  Filled 2018-04-17 (×2): qty 1

## 2018-04-17 MED ORDER — SENNOSIDES-DOCUSATE SODIUM 8.6-50 MG PO TABS: 1.0000 | ORAL_TABLET | Freq: Every evening | ORAL | Status: DC | PRN

## 2018-04-17 MED ORDER — IPRATROPIUM BROMIDE 0.02 % IN SOLN: 0.5000 mg | Freq: Four times a day (QID) | RESPIRATORY_TRACT | Status: DC | PRN

## 2018-04-17 MED ORDER — ONDANSETRON HCL 4 MG PO TABS: 4.0000 mg | ORAL_TABLET | Freq: Four times a day (QID) | ORAL | Status: DC | PRN

## 2018-04-17 MED ORDER — DILTIAZEM HCL 30 MG PO TABS
30.0000 mg | ORAL_TABLET | Freq: Two times a day (BID) | ORAL | Status: DC
  Administered 2018-04-17 – 2018-04-25 (×16): 30 mg via ORAL
  Filled 2018-04-17 (×16): qty 1

## 2018-04-17 MED ORDER — FUROSEMIDE 10 MG/ML IJ SOLN
40.0000 mg | INTRAMUSCULAR | Status: AC
  Administered 2018-04-17: 40 mg via INTRAVENOUS
  Filled 2018-04-17: qty 4

## 2018-04-17 MED ORDER — HYDROCODONE-ACETAMINOPHEN 5-325 MG PO TABS: 1.0000 | ORAL_TABLET | ORAL | Status: DC | PRN

## 2018-04-17 NOTE — ED Triage Notes (Signed)
Pt started feeling SOB while laying in bed this morning. EMS reports clear lung sounds. Pt wears 3L [place] at home. 99% on 3L. Pt has L sided deficits from recent stroke. Pt also has dementia. EMS reports pt in Afib, HR 101 with hx of Afib. BP 180/110.

## 2018-04-17 NOTE — H&P (Signed)
History and Physical    Nicole [name] UJW:[id number] DOB: Mar 09, 1931 DOA: 04/17/2018  PCP: [name], DO Patient coming from: Home   Chief Complaint: Generalized weakness and sob.   HPI: Nicole [name] is a 84 y.o. female with medical history significant of with history of chronic hypoxia on 3 L nasal cannula at home, diastolic congestive heart failure, chronic atrial fibrillation, dementia, CKD stage III, diabetes mellitus type 2, essential hypertension was brought to the hospital by family for evaluation of generalized weakness and shortness of breath.  Patient was here about 3 weeks ago and treated for CHF exacerbation.  She was discharged to [facility] rehab place where she resided for about 3 weeks and went home 2 days ago.  After going home she continued to feel very weak and unable to get out of bed much, husband noted that she was also getting dyspneic.  She does not really take Lasix at home and its prescribed as needed.  Continue using 3 L nasal cannula.  Husband states patient is compliant with her medication for the once which she is prescribed.  Has not really followed up with a cardiologist. Patient is slightly confused at this time therefore history is somewhat limited from her.  Most of the history is provided by the sister and husband present at bedside.  In the ER patient was noted to have elevated blood pressure systolic greater than 562 which Nitropaste was placed on her.  She was also noted to have physical signs of fluid overload with elevated JVD and diminished breath sounds.  She also had elevated BNP.  40 mg of IV Lasix was given and medical team was requested to admit the patient.  When I saw the patient she was sitting comfortably at rest and did not have any active complaints but did admit to feeling extremely weak.   Review of Systems: As per HPI otherwise 10 point review of systems negative.  Review of Systems Otherwise negative except as per HPI,  including: General: Denies fever, chills, night sweats or unintended weight loss. Resp: Denies cough, wheezing Cardiac: Denies  palpitations, orthopnea, paroxysmal nocturnal dyspnea. GI: Denies abdominal pain, nausea, vomiting, diarrhea or constipation GU: Denies dysuria, frequency, hesitancy or incontinence MS: Denies muscle aches, joint pain or swelling Neuro: Denies headache, neurologic deficits (focal weakness, numbness, tingling), abnormal gait Psych: Denies anxiety, depression, SI/HI/AVH Skin: Denies new rashes or lesions ID: Denies sick contacts, exotic exposures, travel  Past Medical History:  Diagnosis Date  . AAA (abdominal aortic aneurysm) ([place])   . Anemia   . Anxiety   . Arthritis   . Atrial fibrillation ([place])   . Cervicalgia   . Cholelithiasis   . Chronic systolic heart failure ([place])   . CKD (chronic kidney disease)   . Colon cancer ([place]) 07/1997  . Depression   . Diabetes mellitus ([place])   . Dizziness and giddiness   . Electrolyte and fluid disorders not elsewhere classified   . GERD (gastroesophageal reflux disease)   . Gout, unspecified   . Hemorrhage of rectum and anus   . Hypercholesteremia   . Hypertension   . Hypoglycemia, unspecified   . Hypopotassemia   . Hypothyroidism   . Kidney stone    renal calculi  . Other chronic allergic conjunctivitis   . Other specified cardiac dysrhythmias(427.89)   . Pain in joint, lower leg   . Perforation of bile duct   . Proteinuria   . Shortness of breath   . Small  bowel obstruction due to adhesions ([place]) 05/16/2012  . Stroke ([place])   . Swelling, mass, or lump in head and neck   . Thoracic aneurysm without mention of rupture    4.6 cm Asc Aortic aneurysm, CT 07/2015    Past Surgical History:  Procedure Laterality Date  . CARDIAC CATHETERIZATION  2008   moderate severe pulm HTN; with elevted pulm capillary wedge pressure   . CHOLECYSTECTOMY  05/14/2012   Procedure: LAPAROSCOPIC CHOLECYSTECTOMY WITH  INTRAOPERATIVE CHOLANGIOGRAM;  Surgeon: Haywood Lasso, MD;  Location: [facility];  Service: General;  Laterality: N/A;  . ERCP  05/17/2012   Procedure: ENDOSCOPIC RETROGRADE CHOLANGIOPANCREATOGRAPHY (ERCP);  Surgeon: Missy Sabins, MD;  Location: [facility];  Service: Gastroenterology;  Laterality: N/A;  . ERCP  08/12/2012   Procedure: ENDOSCOPIC RETROGRADE CHOLANGIOPANCREATOGRAPHY (ERCP);  Surgeon: Missy Sabins, MD;  Location: [facility] ENDOSCOPY;  Service: Endoscopy;  Laterality: N/A;  . HEMICOLECTOMY  08/11/1997  . IR THORACENTESIS ASP PLEURAL SPACE W/IMG GUIDE  02/10/2018  . IR THORACENTESIS ASP PLEURAL SPACE W/IMG GUIDE  02/26/2018  . IR THORACENTESIS ASP PLEURAL SPACE W/IMG GUIDE  03/19/2018  . KIDNEY STONE SURGERY    . TRANSTHORACIC ECHOCARDIOGRAM  11/2009   EF 45-50%, mild-mod AV regurg; mild MR; mod TR; ascending aorta mildly dilated    SOCIAL HISTORY:  reports that she has never smoked. She has never used smokeless tobacco. She reports that she does not drink alcohol or use drugs.  Allergies  Allergen Reactions  . Iohexol Nausea And Vomiting  . Iodinated Diagnostic Agents Nausea And Vomiting  . Z-Pak [Azithromycin] Other (See Comments)    Reaction unknown by family    FAMILY HISTORY: Family History  Problem Relation Age of Onset  . Heart disease Mother   . Stroke Father   . Hypertension Daughter   . Hypertension Son   . Diabetes Sister   . Hypertension Son      Prior to Admission medications   Medication Sig Start Date End Date Taking? Authorizing Provider  acetaminophen (TYLENOL) 650 MG CR tablet Take 650 mg by mouth every 8 (eight) hours as needed for pain.   Yes [provider]  allopurinol (ZYLOPRIM) 100 MG tablet TAKE 1 TABLET BY MOUTH EVERY DAY Patient taking differently: Take 100 mg by mouth daily.  03/10/18  Yes [name], [name], DO  diltiazem (CARDIZEM) 30 MG tablet Take 1 tablet (30 mg total) by mouth 2 (two) times daily. 02/15/18 02/15/19 Yes Georgette Shell, MD   donepezil (ARICEPT) 10 MG tablet Take 1 tablet (10 mg total) by mouth at bedtime. 10/16/17  Yes Danford, Suann Larry, MD  ferrous sulfate 325 (65 FE) MG EC tablet TAKE 1 TABLET (325 MG TOTAL) BY MOUTH 2 (TWO) TIMES DAILY WITH A MEAL. 01/06/18  Yes [name], [name], DO  furosemide (LASIX) 20 MG tablet Take 1 tablet (20 mg total) by mouth daily as needed for fluid or edema. 03/26/18 03/26/19 Yes Gherghe, Vella Redhead, MD  ipratropium (ATROVENT) 0.02 % nebulizer solution Take 2.5 mLs (0.5 mg total) by nebulization every 6 (six) hours as needed for wheezing or shortness of breath. 11/21/17  Yes [name], [name], DO  isosorbide mononitrate (IMDUR) 30 MG 24 hr tablet TAKE 1 TABLET (30 MG TOTAL) BY MOUTH 2 (TWO) TIMES DAILY. 03/10/18  Yes [name], [name], DO  lacosamide (VIMPAT) 50 MG TABS tablet Take 1.5 tablets (75 mg total) by mouth 2 (two) times daily. 03/26/18  Yes Caren Griffins, MD  levothyroxine (SYNTHROID, LEVOTHROID) 25 MCG tablet Take 1 tablet (25 mcg total) by mouth daily before breakfast. 11/15/17  Yes Gildardo Cranker, DO  metoprolol succinate (TOPROL-XL) 25 MG 24 hr tablet Take 1 tablet (25 mg total) by mouth daily. 09/27/17  Yes [name], [name], DO  OXYGEN Inhale 3 L into the lungs continuous.   Yes [provider]  potassium chloride (K-DUR) 10 MEQ tablet Take 1 tablet (10 mEq total) by mouth daily. 02/15/18  Yes Georgette Shell, MD  sertraline (ZOLOFT) 50 MG tablet Take 50 mg by mouth daily.   Yes [provider]  Maltodextrin-Xanthan Gum (RESOURCE THICKENUP CLEAR) POWD 1 packet Patient not taking: Reported on 04/17/2018 03/26/18   Caren Griffins, MD    Physical Exam: Vitals:   04/17/18 1035 04/17/18 1045 04/17/18 1145 04/17/18 1330  BP: (!) 184/123 (!) 184/95 (!) 181/119 (!) 170/119  Pulse:  93    Resp: (!) 23 (!) 23 (!) 22 20  Temp: 98.1 F (36.7 C)     TempSrc: Oral     SpO2: 100% 100% 100% 99%      Constitutional: NAD, calm, comfortable, on 3L [place], elderly frail  lady.  Eyes: PERRL, lids and conjunctivae normal ENMT: Mucous membranes are moist. Posterior pharynx clear of any exudate or lesions.Normal dentition.  Neck: normal, supple, no masses, no thyromegaly, 10 cm JCD Respiratory: diminished BS mid way up her lung fields Cardiovascular: Regular rate and rhythm, no murmurs / rubs / gallops. No extremity edema. 2+ pedal pulses. No carotid bruits.  Trace b/l LE pedal pitting edema.  Abdomen: no tenderness, no masses palpated. No hepatosplenomegaly. Bowel sounds positive.  Musculoskeletal: no clubbing / cyanosis. No joint deformity upper and lower extremities. Good ROM, no contractures. Normal muscle tone.  Skin: no rashes, lesions, ulcers. No induration Neurologic: CN 2-12 grossly intact. Sensation intact, DTR normal. Strength 4/5 in all 4.  Psychiatric: t. Alert and oriented x 1-2 (baseline 3).     Labs on Admission: I have personally reviewed following labs and imaging studies  CBC: Recent Labs  Lab 04/17/18 1034  WBC 7.6  NEUTROABS 5.5  HGB 11.3*  HCT 33.6*  MCV 80.4  PLT 616   Basic Metabolic Panel: Recent Labs  Lab 04/17/18 1034  NA 145  K 4.5  CL 107  CO2 26  GLUCOSE 111*  BUN 26*  CREATININE 1.76*  CALCIUM 8.9   GFR: CrCl cannot be calculated (Unknown ideal weight.). Liver Function Tests: Recent Labs  Lab 04/17/18 1034  AST 38  ALT 31  ALKPHOS 123  BILITOT 1.2  PROT 6.6  ALBUMIN 2.8*   No results for input(s): LIPASE, AMYLASE in the last 168 hours. No results for input(s): AMMONIA in the last 168 hours. Coagulation Profile: No results for input(s): INR, PROTIME in the last 168 hours. Cardiac Enzymes: No results for input(s): CKTOTAL, CKMB, CKMBINDEX, TROPONINI in the last 168 hours. BNP (last 3 results) No results for input(s): PROBNP in the last 8760 hours. HbA1C: No results for input(s): HGBA1C in the last 72 hours. CBG: No results for input(s): GLUCAP in the last 168 hours. Lipid Profile: No results  for input(s): CHOL, HDL, LDLCALC, TRIG, CHOLHDL, LDLDIRECT in the last 72 hours. Thyroid Function Tests: Recent Labs    04/17/18 1034  TSH 2.192  FREET4 0.85   Anemia Panel: No results for input(s): VITAMINB12, FOLATE, FERRITIN, TIBC, IRON, RETICCTPCT in the last 72 hours. Urine analysis:    Component Value Date/Time  COLORURINE YELLOW 02/14/2018 [place] 02/14/2018 1635   LABSPEC 1.010 02/14/2018 1635   PHURINE 5.0 02/14/2018 1635   GLUCOSEU NEGATIVE 02/14/2018 1635   HGBUR NEGATIVE 02/14/2018 1635   BILIRUBINUR NEGATIVE 02/14/2018 1635   KETONESUR NEGATIVE 02/14/2018 1635   PROTEINUR NEGATIVE 02/14/2018 1635   UROBILINOGEN 2.0 (H) 04/13/2015 1844   NITRITE NEGATIVE 02/14/2018 1635   LEUKOCYTESUR NEGATIVE 02/14/2018 1635   Sepsis Labs: !!!!!!!!!!!!!!!!!!!!!!!!!!!!!!!!!!!!!!!!!!!! @LABRCNTIP (procalcitonin:4,lacticidven:4) )No results found for this or any previous visit (from the past 240 hour(s)).   Radiological Exams on Admission: Dg Chest 2 View  Result Date: 04/17/2018 CLINICAL DATA:  Shortness of breath.  CHF. EXAM: CHEST - 2 VIEW COMPARISON:  03/19/2018. FINDINGS: Cardiomegaly with diffuse prominent bilateral pulmonary infiltrates/edema and bilateral pleural effusions. Findings consistent congestive heart failure. Bilateral moaning cannot be excluded. IMPRESSION: Findings consistent congestive heart failure with prominent bilateral pulmonary edema and bilateral pleural effusions. Bilateral pneumonia cannot be excluded. Electronically Signed   By: Marcello Moores  Register   On: 04/17/2018 11:44     All images have been reviewed by me personally.  EKG: Independently reviewed. No acute ST T changes   Assessment/Plan Principal Problem:   Acute respiratory distress Active Problems:   History of colon cancer, stage III   Diabetic nephropathy (HCC)   Hypertensive urgency   Type 2 diabetes mellitus with peripheral neuropathy (HCC)   Essential hypertension    General weakness   GERD (gastroesophageal reflux disease)   Acute diastolic CHF (congestive heart failure), NYHA class 3 (HCC)    Acute respiratory distress likely secondary to acute diastolic congestive heart failure, class III Generalized weakness secondary to fluid overload Hypertensive urgency -Admit the patient to telemetry floor for further care for IV medications and diuresis - We will place the patient on fluid restriction, strict input and output, closely monitor electrolytes -We will place patient on Lasix 40 mill grams IV 3 times daily, closely monitor electrolytes and repleted - Last echocardiogram on 02/2018 showed ejection fraction 60-65%, normal wall motion.  Moderate LVH. - Supplemental oxygen as necessary - We will plan on giving her all of her home medications, nitro ointment placed.  Resume home blood pressure medications.  Acute mild kidney injury on CKD stage III - Patient's baseline creatinine appears to be 1.3, today she is coming in with 1.76.  This is likely secondary to fluid overload.  Anticipate improvement of this with IV diuresis.  We will continue to monitor this and avoid any nephrotoxic drugs as necessary.  If no improvement, will adjust doses renally.  Diabetes mellitus type 2 with peripheral neuropathy -Insulin sliding scale and Accu-Chek.  Continue home long-acting insulin.  Hold p.o. Medications.  Essential hypertension, uncontrolled  -Lasix should help this, will resume her home medications including Cardizem, Imdur and metoprolol.  Also placed nitro paste 1 inch every 6 hours.  Hypothyroidism -Continue Synthroid 25 mcg daily  Chronic atrial fibrillation -Continue Cardizem 30 mg twice daily, metoprolol 25 mg orally daily.  Not on anticoagulation due to history of intracranial bleeding  History of dementia -Continue Aricept and Zoloft  Moderate severe protein calorie malnutrition -Encourage oral diet.   DVT prophylaxis: Subcutaneous  heparin Code Status: Full code Family Communication: Husband and sister at bedside Disposition Plan: Telemetry admission Consults called: None, if necessary to get cardiology in the morning Admission status: Inpatient admission to telemetry for IV diuresis and treatment for blood pressure.   Time Spent: 65 minutes.  >50% of the time was devoted to discussing  the patients care, assessment, plan and disposition with other care givers along with counseling the patient about the risks and benefits of treatment.   Please Note: This patient record was dictated using Editor, commissioning. Chart creation errors have been sought, but may not always have been located. Such creation errors do not reflect on the Standard of Medical Care.   Ankit Arsenio Loader MD [facility] Pager [phone]  If 7PM-7AM, please contact night-coverage [email or web address] Password [email or web address]  04/17/2018, 1:55 PM

## 2018-04-17 NOTE — ED Provider Notes (Signed)
[facility] EMERGENCY DEPARTMENT Provider Note   CSN: [id number] Arrival date & time: 04/17/18  1023     History   Chief Complaint Chief Complaint  Patient presents with  . Shortness of Breath    HPI Nicole [name] is a 84 y.o. female.  84 year old female with extensive past medical history including dementia, atrial fibrillation, CHF, CKD, T2DM, chronic respiratory failure on 3L home O2 who p/w shortness of breath.  Patient called EMS because she was feeling short of breath when she was laying in bed this morning.  She was recently discharged from rehab after admission there from recent stroke with left-sided weakness.  When asked about chest pain or abd pain, she states that she has some that "comes and goes."  LEVEL 5 CAVEAT DUE TO DEMENTIA  The history is provided by the patient and the EMS personnel.  Shortness of Breath     Past Medical History:  Diagnosis Date  . AAA (abdominal aortic aneurysm) ([place])   . Anemia   . Anxiety   . Arthritis   . Atrial fibrillation ([place])   . Cervicalgia   . Cholelithiasis   . Chronic systolic heart failure ([place])   . CKD (chronic kidney disease)   . Colon cancer ([place]) 07/1997  . Depression   . Diabetes mellitus ([place])   . Dizziness and giddiness   . Electrolyte and fluid disorders not elsewhere classified   . GERD (gastroesophageal reflux disease)   . Gout, unspecified   . Hemorrhage of rectum and anus   . Hypercholesteremia   . Hypertension   . Hypoglycemia, unspecified   . Hypopotassemia   . Hypothyroidism   . Kidney stone    renal calculi  . Other chronic allergic conjunctivitis   . Other specified cardiac dysrhythmias(427.89)   . Pain in joint, lower leg   . Perforation of bile duct   . Proteinuria   . Shortness of breath   . Small bowel obstruction due to adhesions ([place]) 05/16/2012  . Stroke ([place])   . Swelling, mass, or lump in head and neck   . Thoracic aneurysm without mention of rupture    4.6 cm Asc Aortic aneurysm, CT 07/2015    Patient Active Problem List   Diagnosis Date Noted  . Intracerebral hemorrhage 03/19/2018  . Protein-calorie malnutrition, severe 03/19/2018  . Acute on chronic diastolic CHF (congestive heart failure) ([place]) 02/26/2018  . Pleural effusion on right 02/26/2018  . Stage III pressure ulcer of sacral region ([place]) 02/15/2018  . Pleural effusion   . Acute on chronic respiratory failure with hypoxia ([place]) 02/11/2018  . Hypoxemia 02/09/2018  . Atrial flutter ([place]) 10/28/2017  . Pressure injury of skin 10/11/2017  . Hyperkalemia 10/10/2017  . Dementia 10/10/2017  . CAD (coronary artery disease) 10/10/2017  . Elevated troponin 10/10/2017  . Stroke ([place])   . Shortness of breath   . Proteinuria   . Perforation of bile duct   . Pain in joint, lower leg   . Other chronic allergic conjunctivitis   . Kidney stone   . Hypothyroidism   . Hypopotassemia   . Hypoglycemia, unspecified   . Hypertension   . Hypercholesteremia   . Hemorrhage of rectum and anus   . Gout, unspecified   . GERD (gastroesophageal reflux disease)   . Dizziness and giddiness   . Diabetes mellitus ([place])   . Depression   . CKD (chronic kidney disease)   . Chronic systolic heart failure ([place])   .  Cervicalgia   . Arthritis   . Anxiety   . AAA (abdominal aortic aneurysm) ([place])   . Moderate protein-calorie malnutrition ([place]) 07/11/2017  . Major depression 05/28/2017  . Adult failure to thrive 05/28/2017  . Acute encephalopathy 05/23/2017  . Acute lower UTI 05/23/2017  . Palliative care by specialist   . DNR (do not resuscitate)   . Bradyarrhythmia 01/11/2017  . Generalized weakness 01/11/2017  . General weakness 01/11/2017  . PE (pulmonary thromboembolism) ([place]) 08/24/2016  . Accelerated hypertension with diastolic congestive heart failure, NYHA class 3 ([place]) 01/05/2016  . Permanent atrial fibrillation ([place]) 01/05/2016  . Rib pain on right side 10/14/2015  . Hypothyroidism  due to acquired atrophy of thyroid 10/14/2015  . Type 2 diabetes mellitus with diabetic nephropathy, without long-term current use of insulin ([place]) 10/14/2015  . Essential hypertension 10/14/2015  . Acute on chronic systolic heart failure ([place]) 08/02/2015  . Bradycardia 07/27/2015  . Chronic anemia 07/27/2015  . Chest pain   . Vascular dementia 06/13/2015  . Memory loss 06/13/2015  . Left hemiparesis ([place])   . Chronic diastolic heart failure ([place])   . Type 2 diabetes mellitus with peripheral neuropathy (HCC)   . ICH (intracerebral hemorrhage) ([place]) 04/04/2015  . Atrial fibrillation, chronic ([place])   . Hypertensive urgency   . SOB (shortness of breath) 04/03/2014  . Diabetic nephropathy ([place]) 09/07/2013  . Chronic constipation 03/25/2013  . Carpal tunnel syndrome 02/25/2013  . Hypokalemia 05/17/2012  . Small bowel obstruction due to adhesions ([place]) 05/16/2012  . Normal coronary arteries, 2008 05/15/2012  . Type II or unspecified type diabetes mellitus with neurological manifestations, not stated as uncontrolled(250.60) 05/15/2012  . Acute respiratory distress 05/15/2012  . Abdominal pain, acute, right upper quadrant 05/12/2012  . Cholecystitis chronic, acute, S/P lap cholecystectomy 05/14/12 05/12/2012  . Thoracic aortic aneurysm without rupture ([place])   . CHF, past NICM with an EF of 30%, most recent echo 2011 showed EF to be45- 50%   . Anemia 08/09/2011  . History of colon cancer, stage III 08/09/2011  . Colon cancer ([place]) 07/23/1997    Past Surgical History:  Procedure Laterality Date  . CARDIAC CATHETERIZATION  2008   moderate severe pulm HTN; with elevted pulm capillary wedge pressure   . CHOLECYSTECTOMY  05/14/2012   Procedure: LAPAROSCOPIC CHOLECYSTECTOMY WITH INTRAOPERATIVE CHOLANGIOGRAM;  Surgeon: Haywood Lasso, MD;  Location: [facility];  Service: General;  Laterality: N/A;  . ERCP  05/17/2012   Procedure: ENDOSCOPIC RETROGRADE CHOLANGIOPANCREATOGRAPHY (ERCP);  Surgeon:  Missy Sabins, MD;  Location: [facility];  Service: Gastroenterology;  Laterality: N/A;  . ERCP  08/12/2012   Procedure: ENDOSCOPIC RETROGRADE CHOLANGIOPANCREATOGRAPHY (ERCP);  Surgeon: Missy Sabins, MD;  Location: [facility];  Service: Endoscopy;  Laterality: N/A;  . HEMICOLECTOMY  08/11/1997  . IR THORACENTESIS ASP PLEURAL SPACE W/IMG GUIDE  02/10/2018  . IR THORACENTESIS ASP PLEURAL SPACE W/IMG GUIDE  02/26/2018  . IR THORACENTESIS ASP PLEURAL SPACE W/IMG GUIDE  03/19/2018  . KIDNEY STONE SURGERY    . TRANSTHORACIC ECHOCARDIOGRAM  11/2009   EF 45-50%, mild-mod AV regurg; mild MR; mod TR; ascending aorta mildly dilated     OB History   None      Home Medications    Prior to Admission medications   Medication Sig Start Date End Date Taking? Authorizing Provider  acetaminophen (TYLENOL) 650 MG CR tablet Take 650 mg by mouth every 8 (eight) hours as needed for pain.   Yes [provider]  allopurinol (ZYLOPRIM) 100 MG tablet TAKE 1 TABLET BY MOUTH EVERY DAY Patient taking differently: Take 100 mg by mouth daily.  03/10/18  Yes [name], [name], DO  diltiazem (CARDIZEM) 30 MG tablet Take 1 tablet (30 mg total) by mouth 2 (two) times daily. 02/15/18 02/15/19 Yes Georgette Shell, MD  donepezil (ARICEPT) 10 MG tablet Take 1 tablet (10 mg total) by mouth at bedtime. 10/16/17  Yes Danford, Suann Larry, MD  ferrous sulfate 325 (65 FE) MG EC tablet TAKE 1 TABLET (325 MG TOTAL) BY MOUTH 2 (TWO) TIMES DAILY WITH A MEAL. 01/06/18  Yes [name], [name], DO  furosemide (LASIX) 20 MG tablet Take 1 tablet (20 mg total) by mouth daily as needed for fluid or edema. 03/26/18 03/26/19 Yes Gherghe, Vella Redhead, MD  ipratropium (ATROVENT) 0.02 % nebulizer solution Take 2.5 mLs (0.5 mg total) by nebulization every 6 (six) hours as needed for wheezing or shortness of breath. 11/21/17  Yes [name], [name], DO  isosorbide mononitrate (IMDUR) 30 MG 24 hr tablet TAKE 1 TABLET (30 MG TOTAL) BY MOUTH 2 (TWO) TIMES DAILY.  03/10/18  Yes [name], [name], DO  lacosamide (VIMPAT) 50 MG TABS tablet Take 1.5 tablets (75 mg total) by mouth 2 (two) times daily. 03/26/18  Yes Caren Griffins, MD  levothyroxine (SYNTHROID, LEVOTHROID) 25 MCG tablet Take 1 tablet (25 mcg total) by mouth daily before breakfast. 11/15/17  Yes Gildardo Cranker, DO  metoprolol succinate (TOPROL-XL) 25 MG 24 hr tablet Take 1 tablet (25 mg total) by mouth daily. 09/27/17  Yes [name], [name], DO  OXYGEN Inhale 3 L into the lungs continuous.   Yes [provider]  potassium chloride (K-DUR) 10 MEQ tablet Take 1 tablet (10 mEq total) by mouth daily. 02/15/18  Yes Georgette Shell, MD  sertraline (ZOLOFT) 50 MG tablet Take 50 mg by mouth daily.   Yes [provider]  Maltodextrin-Xanthan Gum ([place]) POWD 1 packet Patient not taking: Reported on 04/17/2018 03/26/18   Caren Griffins, MD    Family History Family History  Problem Relation Age of Onset  . Heart disease Mother   . Stroke Father   . Hypertension Daughter   . Hypertension Son   . Diabetes Sister   . Hypertension Son     Social History Social History   Tobacco Use  . Smoking status: Never Smoker  . Smokeless tobacco: Never Used  Substance Use Topics  . Alcohol use: No  . Drug use: No     Allergies   Iohexol; Iodinated diagnostic agents; and Z-pak [azithromycin]   Review of Systems Review of Systems  Unable to perform ROS: Dementia  Respiratory: Positive for shortness of breath.      Physical Exam Updated Vital Signs BP (!) 181/119   Pulse (!) 102   Temp 98.1 F (36.7 C) (Oral)   Resp (!) 22   SpO2 100%   Physical Exam  Constitutional: She appears well-developed. No distress.  Frail, elderly woman awake and alert  HENT:  Head: Normocephalic and atraumatic.  Moist mucous membranes  Eyes: Pupils are equal, round, and reactive to light. Conjunctivae are normal.  Neck: Neck supple. JVD present.  Cardiovascular: Normal  rate. An irregularly irregular rhythm present.  Murmur heard. Pulmonary/Chest: Accessory muscle usage present. No respiratory distress. She has decreased breath sounds.  Supracostal retractions  Abdominal: Soft. Bowel sounds are normal. She exhibits no distension. There is no tenderness.  Musculoskeletal: She exhibits no  edema.  Neurological: She is alert.  Oriented x 1, follows basic commands  Skin: Skin is warm and dry.  Psychiatric: She has a normal mood and affect. Judgment normal.  Nursing note and vitals reviewed.    ED Treatments / Results  Labs (all labs ordered are listed, but only abnormal results are displayed) Labs Reviewed  COMPREHENSIVE METABOLIC PANEL - Abnormal; Notable for the following components:      Result Value   Glucose, Bld 111 (*)    BUN 26 (*)    Creatinine, Ser 1.76 (*)    Albumin 2.8 (*)    GFR calc non Af Amer 25 (*)    GFR calc Af Amer 29 (*)    All other components within normal limits  BRAIN NATRIURETIC PEPTIDE - Abnormal; Notable for the following components:   B Natriuretic Peptide 2,577.0 (*)    All other components within normal limits  CBC WITH DIFFERENTIAL/PLATELET - Abnormal; Notable for the following components:   Hemoglobin 11.3 (*)    HCT 33.6 (*)    RDW 16.4 (*)    All other components within normal limits  T4, FREE  TSH  URINALYSIS, ROUTINE W REFLEX MICROSCOPIC  I-STAT TROPONIN, ED    EKG EKG Interpretation  Date/Time:  Thursday April 17 2018 10:31:00 EDT Ventricular Rate:  92 PR Interval:    QRS Duration: 133 QT Interval:  444 QTC Calculation: 550 R Axis:   82 Text Interpretation:  Atrial fibrillation IVCD, consider atypical RBBB Nonspecific T abnormalities, lateral leads similar to previous Confirmed by Theotis Burrow [phone]) on 04/17/2018 10:38:27 AM   Radiology Dg Chest 2 View  Result Date: 04/17/2018 CLINICAL DATA:  Shortness of breath.  CHF. EXAM: CHEST - 2 VIEW COMPARISON:  03/19/2018. FINDINGS:  Cardiomegaly with diffuse prominent bilateral pulmonary infiltrates/edema and bilateral pleural effusions. Findings consistent congestive heart failure. Bilateral moaning cannot be excluded. IMPRESSION: Findings consistent congestive heart failure with prominent bilateral pulmonary edema and bilateral pleural effusions. Bilateral pneumonia cannot be excluded. Electronically Signed   By: Marcello Moores  Register   On: 04/17/2018 11:44    Procedures Procedures (including critical care time)  Medications Ordered in ED Medications  nitroGLYCERIN (NITROGLYN) 2 % ointment 1 inch (1 inch Topical Given 04/17/18 1047)  furosemide (LASIX) injection 40 mg (40 mg Intravenous Given 04/17/18 1230)  labetalol (NORMODYNE,TRANDATE) injection 10 mg (10 mg Intravenous Given 04/17/18 1230)     Initial Impression / Assessment and Plan / ED Course  I have reviewed the triage vital signs and the nursing notes.  Pertinent labs & imaging results that were available during my care of the patient were reviewed by me and considered in my medical decision making (see chart for details).    Pt was dyspneic on exam but no respiratory distress, stable on 3L O2.  BP elevated at 184/123.  Given elevated blood pressure, JVD, suspect possible CHF exacerbation.  Placed Nitropaste.   Chest x-ray shows bilateral pulmonary edema and pleural effusions.  Lab work notable for negative troponin, BNP 2577, creatinine 1.76.  Gave a dose of Lasix and one-time dose of IV labetalol for persistently elevated blood pressure.  Discussed admission with [facility], Dr. Reesa Chew, and patient admitted for diuresis and further care. Final Clinical Impressions(s) / ED Diagnoses   Final diagnoses:  Acute on chronic congestive heart failure, unspecified heart failure type [facility])    ED Discharge Orders    None       Vennie Waymire, Wenda Overland, MD 04/17/18 1303

## 2018-04-18 ENCOUNTER — Other Ambulatory Visit: Payer: Self-pay

## 2018-04-18 DIAGNOSIS — I160 Hypertensive urgency: Secondary | ICD-10-CM

## 2018-04-18 DIAGNOSIS — I5031 Acute diastolic (congestive) heart failure: Secondary | ICD-10-CM

## 2018-04-18 DIAGNOSIS — I110 Hypertensive heart disease with heart failure: Secondary | ICD-10-CM

## 2018-04-18 DIAGNOSIS — R0603 Acute respiratory distress: Secondary | ICD-10-CM

## 2018-04-18 LAB — COMPREHENSIVE METABOLIC PANEL
ALBUMIN: 2.5 g/dL — AB (ref 3.5–5.0)
ALT: 23 U/L (ref 0–44)
AST: 26 U/L (ref 15–41)
Alkaline Phosphatase: 106 U/L (ref 38–126)
Anion gap: 9 (ref 5–15)
BUN: 26 mg/dL — ABNORMAL HIGH (ref 8–23)
CHLORIDE: 105 mmol/L (ref 98–111)
CO2: 31 mmol/L (ref 22–32)
Calcium: 8.3 mg/dL — ABNORMAL LOW (ref 8.9–10.3)
Creatinine, Ser: 1.55 mg/dL — ABNORMAL HIGH (ref 0.44–1.00)
GFR calc Af Amer: 34 mL/min — ABNORMAL LOW (ref >60)
GFR calc non Af Amer: 29 mL/min — ABNORMAL LOW (ref >60)
GLUCOSE: 102 mg/dL — AB (ref 70–99)
Potassium: 3.2 mmol/L — ABNORMAL LOW (ref 3.5–5.1)
SODIUM: 145 mmol/L (ref 135–145)
Total Bilirubin: 0.8 mg/dL (ref 0.3–1.2)
Total Protein: 5.7 g/dL — ABNORMAL LOW (ref 6.5–8.1)

## 2018-04-18 LAB — CBC
HEMATOCRIT: 30.2 % — AB (ref 36.0–46.0)
Hemoglobin: 10.4 g/dL — ABNORMAL LOW (ref 12.0–15.0)
MCH: 27.2 pg (ref 26.0–34.0)
MCHC: 34.4 g/dL (ref 30.0–36.0)
MCV: 78.9 fL (ref 78.0–100.0)
Platelets: 211 10*3/uL (ref 150–400)
RBC: 3.83 MIL/uL — ABNORMAL LOW (ref 3.87–5.11)
RDW: 15.9 % — AB (ref 11.5–15.5)
WBC: 8 10*3/uL (ref 4.0–10.5)

## 2018-04-18 LAB — GLUCOSE, CAPILLARY
GLUCOSE-CAPILLARY: 105 mg/dL — AB (ref 70–99)
GLUCOSE-CAPILLARY: 38 mg/dL — AB (ref 70–99)
Glucose-Capillary: 106 mg/dL — ABNORMAL HIGH (ref 70–99)
Glucose-Capillary: 225 mg/dL — ABNORMAL HIGH (ref 70–99)
Glucose-Capillary: 63 mg/dL — ABNORMAL LOW (ref 70–99)
Glucose-Capillary: 102 mg/dL — ABNORMAL HIGH (ref 70–99)

## 2018-04-18 LAB — APTT: aPTT: 37 seconds — ABNORMAL HIGH (ref 24–36)

## 2018-04-18 LAB — VITAMIN B12: VITAMIN B 12: 363 pg/mL (ref 180–914)

## 2018-04-18 LAB — PROTIME-INR
INR: 1.38
Prothrombin Time: 16.9 seconds — ABNORMAL HIGH (ref 11.4–15.2)

## 2018-04-18 LAB — MAGNESIUM: Magnesium: 1.4 mg/dL — ABNORMAL LOW (ref 1.7–2.4)

## 2018-04-18 MED ORDER — MAGNESIUM SULFATE 2 GM/50ML IV SOLN
2.0000 g | Freq: Once | INTRAVENOUS | Status: AC
  Administered 2018-04-18: 2 g via INTRAVENOUS
  Filled 2018-04-18: qty 50

## 2018-04-18 MED ORDER — POTASSIUM CHLORIDE CRYS ER 20 MEQ PO TBCR
40.0000 meq | EXTENDED_RELEASE_TABLET | Freq: Once | ORAL | Status: AC
  Administered 2018-04-18: 40 meq via ORAL
  Filled 2018-04-18: qty 2

## 2018-04-18 MED ORDER — HYDRALAZINE HCL 20 MG/ML IJ SOLN: 10.0000 mg | Freq: Three times a day (TID) | INTRAMUSCULAR | Status: DC | PRN

## 2018-04-18 MED ORDER — METOPROLOL SUCCINATE ER 50 MG PO TB24
50.0000 mg | ORAL_TABLET | Freq: Every day | ORAL | Status: DC
  Administered 2018-04-19 – 2018-04-25 (×7): 50 mg via ORAL
  Filled 2018-04-18 (×7): qty 1

## 2018-04-18 MED ORDER — HYDRALAZINE HCL 25 MG PO TABS
25.0000 mg | ORAL_TABLET | Freq: Three times a day (TID) | ORAL | Status: DC
  Administered 2018-04-18 – 2018-04-19 (×3): 25 mg via ORAL
  Filled 2018-04-18 (×3): qty 1

## 2018-04-18 NOTE — Progress Notes (Signed)
PROGRESS NOTE    Nicole [name]  UYQ:[id number] DOB: 11/28/1930 DOA: 04/17/2018 PCP: [name], DO    Brief Narrative: Nicole [name] is a 84 y.o. female with medical history significant of with history of chronic hypoxia on 3 L nasal cannula at home, diastolic congestive heart failure, chronic atrial fibrillation, dementia, CKD stage III, diabetes mellitus type 2, essential hypertension was brought to the hospital by family for evaluation of generalized weakness and shortness of breath.  Patient was here about 3 weeks ago and treated for CHF exacerbation.  She was discharged to [facility] rehab place where she resided for about 3 weeks and went home 2 days ago.  After going home she continued to feel very weak and unable to get out of bed much, husband noted that she was also getting dyspneic.  She does not really take Lasix at home and its prescribed as needed.  Continue using 3 L nasal cannula.  Husband states patient is compliant with her medication for the once which she is prescribed.  Has not really followed up with a cardiologist. Patient is slightly confused at this time therefore history is somewhat limited from her.  Most of the history is provided by the sister and husband present at bedside.  In the ER patient was noted to have elevated blood pressure systolic greater than 638 which Nitropaste was placed on her.  She was also noted to have physical signs of fluid overload with elevated JVD and diminished breath sounds.  She also had elevated BNP.  40 mg of IV Lasix was given and medical team was requested to admit the patient.  When I saw the patient she was sitting comfortably at rest and did not have any active complaints but did admit to feeling extremely weak.   Assessment & Plan:   Principal Problem:   Acute respiratory distress Active Problems:   History of colon cancer, stage III   Diabetic nephropathy (HCC)   Hypertensive urgency   Type 2 diabetes mellitus with  peripheral neuropathy (HCC)   Essential hypertension   General weakness   GERD (gastroesophageal reflux disease)   Acute diastolic CHF (congestive heart failure), NYHA class 3 (HCC)   Acute diastolic heart failure exacerbation;  Acute respiratory distress.  Continue with IV lasix 40 mg IV TID>  ECHO with diastolic dysfunction, moderate aortic insufficiency and mild mitral regurgitation.  Cardiology consulted.  BNP 2577. Chest x ray ; bilateral pulmonary edema , pleural effusion.  Urine out put 1. 3 L  Weight 124.   HTN urgency;  Continue with Cardizem, IV lasix, imdur, nitro paste.  Start oral hydralazine and PRN IV hydralazine.   Generalize weakness; multifactorial, CHF, deconditioning.   AKI on CKD stage III;  Suspect related to heart failure.  monitor on lasix.  Cr trending down.   DM; SSI  Hypothyroidism;  Continue with synthroid.   Chronc A fib;  No anticoagulation due to recent ICH.  Continue with Cardizem.  Continue with metoprolol/   History of dementia.  Continue with Aricept.   Moderate severe protein calorie malnutrition -Encourage oral diet.  History of [facility] discharge 7-56; complicated by seizure.  On Vimpat for seizure.   Hypomagnesemia; replete IV   Hypokalemia ; replete orally   DVT prophylaxis: SCD.  Code Status: DNR Family Communication: no family at bedside.  Disposition Plan: remain inpatient for management of HF, HTN urgency   Consultants:   Cardiology    Procedures:   none   Antimicrobials:  none   Subjective: Sleepy, wake up answer few questions. Feels weak.   Objective: Vitals:   04/17/18 1950 04/18/18 0009 04/18/18 0618 04/18/18 1222  BP: (!) 153/106 (!) 162/93 (!) 174/101 (!) 190/111  Pulse: 88 70 83 76  Resp: 18 (!) 22 (!) 22 (!) 24  Temp: 98.1 F (36.7 C) 97.6 F (36.4 C) 98.2 F (36.8 C) 99.3 F (37.4 C)  TempSrc: Oral Oral Oral Oral  SpO2: 97% 94% 99% 98%  Weight:   56.6 kg   Height:         Intake/Output Summary (Last 24 hours) at 04/18/2018 1527 Last data filed at 04/18/2018 1024 Gross per 24 hour  Intake 340 ml  Output 1950 ml  Net -1610 ml   Filed Weights   04/17/18 1417 04/18/18 0618  Weight: 56.5 kg 56.6 kg    Examination:  General exam: sleepy  Respiratory system: Bilateral crackles.  Cardiovascular system: S 1, S 2 JVD Gastrointestinal system: BS present, soft, nt Central nervous system: sleepy , generalized weak.  Extremities: trace edema Skin: No rashes, lesions or ulcers    Data Reviewed: I have personally reviewed following labs and imaging studies  CBC: Recent Labs  Lab 04/17/18 1034 04/18/18 0513  WBC 7.6 8.0  NEUTROABS 5.5  --   HGB 11.3* 10.4*  HCT 33.6* 30.2*  MCV 80.4 78.9  PLT 232 124   Basic Metabolic Panel: Recent Labs  Lab 04/17/18 1034 04/18/18 0513  NA 145 145  K 4.5 3.2*  CL 107 105  CO2 26 31  GLUCOSE 111* 102*  BUN 26* 26*  CREATININE 1.76* 1.55*  CALCIUM 8.9 8.3*  MG  --  1.4*   GFR: Estimated Creatinine Clearance: 21 mL/min (A) (by C-G formula based on SCr of 1.55 mg/dL (H)). Liver Function Tests: Recent Labs  Lab 04/17/18 1034 04/18/18 0513  AST 38 26  ALT 31 23  ALKPHOS 123 106  BILITOT 1.2 0.8  PROT 6.6 5.7*  ALBUMIN 2.8* 2.5*   No results for input(s): LIPASE, AMYLASE in the last 168 hours. No results for input(s): AMMONIA in the last 168 hours. Coagulation Profile: Recent Labs  Lab 04/18/18 0513  INR 1.38   Cardiac Enzymes: No results for input(s): CKTOTAL, CKMB, CKMBINDEX, TROPONINI in the last 168 hours. BNP (last 3 results) No results for input(s): PROBNP in the last 8760 hours. HbA1C: No results for input(s): HGBA1C in the last 72 hours. CBG: Recent Labs  Lab 04/17/18 1635 04/17/18 2153 04/18/18 0736 04/18/18 1217  GLUCAP 180* 113* 106* 105*   Lipid Profile: No results for input(s): CHOL, HDL, LDLCALC, TRIG, CHOLHDL, LDLDIRECT in the last 72 hours. Thyroid Function  Tests: Recent Labs    04/17/18 1034  TSH 2.192  FREET4 0.85   Anemia Panel: Recent Labs    04/18/18 1024  VITAMINB12 363   Sepsis Labs: No results for input(s): PROCALCITON, LATICACIDVEN in the last 168 hours.  No results found for this or any previous visit (from the past 240 hour(s)).       Radiology Studies: Dg Chest 2 View  Result Date: 04/17/2018 CLINICAL DATA:  Shortness of breath.  CHF. EXAM: CHEST - 2 VIEW COMPARISON:  03/19/2018. FINDINGS: Cardiomegaly with diffuse prominent bilateral pulmonary infiltrates/edema and bilateral pleural effusions. Findings consistent congestive heart failure. Bilateral moaning cannot be excluded. IMPRESSION: Findings consistent congestive heart failure with prominent bilateral pulmonary edema and bilateral pleural effusions. Bilateral pneumonia cannot be excluded. Electronically Signed   By: Marcello Moores  Register  On: 04/17/2018 11:44        Scheduled Meds: . diltiazem  30 mg Oral BID  . donepezil  10 mg Oral QHS  . ferrous sulfate  325 mg Oral BID WC  . furosemide  40 mg Intravenous Q8H  . hydrALAZINE  25 mg Oral Q8H  . insulin aspart  0-9 Units Subcutaneous TID WC  . isosorbide mononitrate  30 mg Oral BID  . lacosamide  75 mg Oral BID  . levothyroxine  25 mcg Oral QAC breakfast  . [START ON 04/19/2018] metoprolol succinate  50 mg Oral Daily  . nitroGLYCERIN  1 inch Topical Q6H  . sertraline  50 mg Oral Daily   Continuous Infusions:   LOS: 1 day    Time spent: 35 minutes.     Elmarie Shiley, MD [facility] Pager [phone]  If 7PM-7AM, please contact night-coverage [email or web address] Password [facility] 04/18/2018, 3:27 PM

## 2018-04-18 NOTE — Progress Notes (Signed)
Hypoglycemic Event  CBG:38  Treatment: 15 GM carbohydrate snack  Symptoms: Shaking  Follow-up CBG: Time: 2126 CBG Result: 63  Possible Reasons for Event: Inadequate meal intake   CBG: 63  Treatment: 15 GM carbohydrate snack  Symptoms: Shaky  Follow-up CBG: Time:2159 CBG Result:102  Possible Reasons for Event: Inadequate meal intake  Comments/MD notified: Bodenheimer, NP  Maydelin Deming I

## 2018-04-18 NOTE — Care Management Note (Signed)
Case Management Note  Patient Details  Name: [name] MRN: [id number] Date of Birth: 1931-04-30  Subjective/Objective:                 Readmission. [place] 9/4 to [facility]. Used her 21 days at facility. Was referred to [facility].   Action/Plan:  Patient from home with SO who cooks all meals and caters to patient per personal aid at bedside. Personal aid stays with patient 9-3 M-F and on Sunday.  Spoke w Daughter Peter Congo, and God daughter Cecil Cranker  About plan for DC and likely plan for home since patient has used 21 days at [facility]. [name] provided CM with number for [facility]. Spoke w Malachy Mood at [facility] who states that she will put patient on the schedule for first home visit Monday. Please call Malachy Mood ASAP on day of DC and if she is notified prior to 12:00 she is able to have patient seen that same day. Patient will need resumption orders. No DME needs identified.  Expected Discharge Date:                  Expected Discharge Plan:     In-House Referral:     Discharge planning Services  CM Consult  Post Acute Care Choice:  Home Health Choice offered to:  Patient, Adult Children  DME Arranged:    DME Agency:     HH Arranged:  RN, PT, OT, Nurse's Aide, Social Work [employer] Agency:  [place]  Status of Service:  In process, will continue to follow  If discussed at Long Length of Stay Meetings, dates discussed:    Additional Comments:  Carles Collet, RN 04/18/2018, 4:11 PM

## 2018-04-18 NOTE — Consult Note (Addendum)
Cardiology Consultation:   Patient ID: Nicole [name]; [id number]; 07-25-1930   Admit date: 04/17/2018 Date of Consult: 04/18/2018  Primary Care Provider: [name], DO Primary Cardiologist: Dr. Debara Pickett, MD  Patient Profile:   Nicole [name] is a 84 y.o. female with a hx of chronic atrial fibrillation with controlled ventricular response (not an anticoagulation candidate), chronic hypoxia on 3 L nasal cannula at home, chronic congestive heart failure with EF of  improved to and moderate MR, known thoracic aneurysm, hypertension, diabetes, dyslipidemia and recent CVA (dischrged 03/26/18) who is being seen today for the evaluation of acute CHF exacerbation at the request of Dr. Reesa Chew.   History of Present Illness:   Nicole [name] is an 84 year old female with a history stated above who presented to [facility] on 04/17/2018 for the evaluation of generalized weakness and shortness of breath. Per chart review, patient was admitted approximately 3 weeks ago on 03/17/2018 for CHF exacerbation. Cardiology was not consulted during that stay. Hospital course was complicated by significant pleural effusions requiring insertion of Pleurex drain and thoracentesis drainage as well as a subacute small right thalamic ICH, likely combination of hypertension and cerebral amyloid angiopathy, followed by neurology. She was discharged to [facility] rehabilitation where she has resided for 3 weeks then went home 2 days ago.  After going home, she continued to feel very weak and unable to get out of bed without difficulty. Current history difficult to obtain secondary to mild dementia and some confusion. Her husband is at bedside who states that after coming home from rehab, she was not getting better and that her breathing became acutely worse. He was having to move her around in bed and found that she was continuously short of breath. She denies chest pain, palpitations, recent illness, fever, chills, N/V, dizziness, LE  swelling or orthopnea. Husband reports that her appetite has been poor for some time. She reports complete medication compliance and no diet indiscretions.   In the ED, patient was found to be markedly hypertensive with SBP greater than 194 in which Nitropaste was placed. She was also noted to have fluid volume overloaded with elevated JVD and diminished breath sounds on exam. Her BNP was elevated at 2577.  I-STAT troponin was noted to be 0.03.  Creatinine mildly elevated at 1.55 with a baseline of 1.2-1.4 range.  CXR completed revealed findings consistent with congestive heart failure with prominent bilateral pulmonary edema and bilateral pleural effusions in which bilateral pneumonia cannot be excluded. She was given IV Lasix 40 mg, now with symptom management.  Of note, she was last seen by her primary cardiologist on 10/28/2017. At that time she had recently been admitted for acute on chronic diastolic heart failure and atrial fib/flutter. She was in rapid ventricular response and rate was better controlled on diltiazem. She was diuresed 4 L with IV Lasix to euvolemic weight of approximately 130lb.  Her weight on 10/28/2017 was 129lb. At that time, she denied worsening shortness of breath and remained on 2 L nasal cannula at home.  Past Medical History:  Diagnosis Date  . AAA (abdominal aortic aneurysm) ([place])   . Anemia   . Anxiety   . Arthritis   . Atrial fibrillation ([place])   . Cervicalgia   . Cholelithiasis   . Chronic systolic heart failure ([place])   . CKD (chronic kidney disease)   . Colon cancer ([place]) 07/1997  . Depression   . Diabetes mellitus ([place])   . Dizziness and giddiness   .  Electrolyte and fluid disorders not elsewhere classified   . GERD (gastroesophageal reflux disease)   . Gout, unspecified   . Hemorrhage of rectum and anus   . Hypercholesteremia   . Hypertension   . Hypoglycemia, unspecified   . Hypopotassemia   . Hypothyroidism   . Kidney stone    renal calculi  .  Other chronic allergic conjunctivitis   . Other specified cardiac dysrhythmias(427.89)   . Pain in joint, lower leg   . Perforation of bile duct   . Proteinuria   . Shortness of breath   . Small bowel obstruction due to adhesions ([place]) 05/16/2012  . Stroke ([place])   . Swelling, mass, or lump in head and neck   . Thoracic aneurysm without mention of rupture    4.6 cm Asc Aortic aneurysm, CT 07/2015    Past Surgical History:  Procedure Laterality Date  . CARDIAC CATHETERIZATION  2008   moderate severe pulm HTN; with elevted pulm capillary wedge pressure   . CHOLECYSTECTOMY  05/14/2012   Procedure: LAPAROSCOPIC CHOLECYSTECTOMY WITH INTRAOPERATIVE CHOLANGIOGRAM;  Surgeon: Haywood Lasso, MD;  Location: [place];  Service: General;  Laterality: N/A;  . ERCP  05/17/2012   Procedure: ENDOSCOPIC RETROGRADE CHOLANGIOPANCREATOGRAPHY (ERCP);  Surgeon: Missy Sabins, MD;  Location: [address];  Service: Gastroenterology;  Laterality: N/A;  . ERCP  08/12/2012   Procedure: ENDOSCOPIC RETROGRADE CHOLANGIOPANCREATOGRAPHY (ERCP);  Surgeon: Missy Sabins, MD;  Location: [facility] ENDOSCOPY;  Service: Endoscopy;  Laterality: N/A;  . HEMICOLECTOMY  08/11/1997  . IR THORACENTESIS ASP PLEURAL SPACE W/IMG GUIDE  02/10/2018  . IR THORACENTESIS ASP PLEURAL SPACE W/IMG GUIDE  02/26/2018  . IR THORACENTESIS ASP PLEURAL SPACE W/IMG GUIDE  03/19/2018  . KIDNEY STONE SURGERY    . TRANSTHORACIC ECHOCARDIOGRAM  11/2009   EF 45-50%, mild-mod AV regurg; mild MR; mod TR; ascending aorta mildly dilated     Prior to Admission medications   Medication Sig Start Date End Date Taking? Authorizing Provider  acetaminophen (TYLENOL) 650 MG CR tablet Take 650 mg by mouth every 8 (eight) hours as needed for pain.   Yes [provider]  allopurinol (ZYLOPRIM) 100 MG tablet TAKE 1 TABLET BY MOUTH EVERY DAY Patient taking differently: Take 100 mg by mouth daily.  03/10/18  Yes [name], [name], DO  diltiazem (CARDIZEM) 30 MG tablet Take 1  tablet (30 mg total) by mouth 2 (two) times daily. 02/15/18 02/15/19 Yes Georgette Shell, MD  donepezil (ARICEPT) 10 MG tablet Take 1 tablet (10 mg total) by mouth at bedtime. 10/16/17  Yes Danford, Suann Larry, MD  ferrous sulfate 325 (65 FE) MG EC tablet TAKE 1 TABLET (325 MG TOTAL) BY MOUTH 2 (TWO) TIMES DAILY WITH A MEAL. 01/06/18  Yes [name], [name], DO  furosemide (LASIX) 20 MG tablet Take 1 tablet (20 mg total) by mouth daily as needed for fluid or edema. 03/26/18 03/26/19 Yes Gherghe, Vella Redhead, MD  ipratropium (ATROVENT) 0.02 % nebulizer solution Take 2.5 mLs (0.5 mg total) by nebulization every 6 (six) hours as needed for wheezing or shortness of breath. 11/21/17  Yes [name], [name], DO  isosorbide mononitrate (IMDUR) 30 MG 24 hr tablet TAKE 1 TABLET (30 MG TOTAL) BY MOUTH 2 (TWO) TIMES DAILY. 03/10/18  Yes [name], [name], DO  lacosamide (VIMPAT) 50 MG TABS tablet Take 1.5 tablets (75 mg total) by mouth 2 (two) times daily. 03/26/18  Yes Gherghe, Vella Redhead, MD  levothyroxine (SYNTHROID, LEVOTHROID) 25 MCG tablet  Take 1 tablet (25 mcg total) by mouth daily before breakfast. 11/15/17  Yes Gildardo Cranker, DO  metoprolol succinate (TOPROL-XL) 25 MG 24 hr tablet Take 1 tablet (25 mg total) by mouth daily. 09/27/17  Yes [name], [name], DO  OXYGEN Inhale 3 L into the lungs continuous.   Yes [provider]  potassium chloride (K-DUR) 10 MEQ tablet Take 1 tablet (10 mEq total) by mouth daily. 02/15/18  Yes Georgette Shell, MD  sertraline (ZOLOFT) 50 MG tablet Take 50 mg by mouth daily.   Yes [provider]  Maltodextrin-Xanthan Gum ([place]) POWD 1 packet Patient not taking: Reported on 04/17/2018 03/26/18   Caren Griffins, MD    Inpatient Medications: Scheduled Meds: . diltiazem  30 mg Oral BID  . donepezil  10 mg Oral QHS  . ferrous sulfate  325 mg Oral BID WC  . furosemide  40 mg Intravenous Q8H  . hydrALAZINE  25 mg Oral Q8H  . insulin aspart  0-9 Units  Subcutaneous TID WC  . isosorbide mononitrate  30 mg Oral BID  . lacosamide  75 mg Oral BID  . levothyroxine  25 mcg Oral QAC breakfast  . metoprolol succinate  25 mg Oral Daily  . nitroGLYCERIN  1 inch Topical Q6H  . sertraline  50 mg Oral Daily   Continuous Infusions:  PRN Meds: acetaminophen **OR** acetaminophen, bisacodyl, hydrALAZINE, HYDROcodone-acetaminophen, ipratropium, ondansetron **OR** ondansetron (ZOFRAN) IV, senna-docusate  Allergies:    Allergies  Allergen Reactions  . Iohexol Nausea And Vomiting  . Iodinated Diagnostic Agents Nausea And Vomiting  . Z-Pak [Azithromycin] Other (See Comments)    Reaction unknown by family    Social History:   Social History   Socioeconomic History  . Marital status: Widowed    Spouse name: Not on file  . Number of children: Not on file  . Years of education: Not on file  . Highest education level: Not on file  Occupational History  . Not on file  Social Needs  . Financial resource strain: Not hard at all  . Food insecurity:    Worry: Never true    Inability: Never true  . Transportation needs:    Medical: No    Non-medical: No  Tobacco Use  . Smoking status: Never Smoker  . Smokeless tobacco: Never Used  Substance and Sexual Activity  . Alcohol use: No  . Drug use: No  . Sexual activity: Never  Lifestyle  . Physical activity:    Days per week: 0 days    Minutes per session: 0 min  . Stress: Not at all  Relationships  . Social connections:    Talks on phone: More than three times a week    Gets together: More than three times a week    Attends religious service: 1 to 4 times per year    Active member of club or organization: Yes    Attends meetings of clubs or organizations: Never    Relationship status: Widowed  . Intimate partner violence:    Fear of current or ex partner: No    Emotionally abused: No    Physically abused: No    Forced sexual activity: No  Other Topics Concern  . Not on file  Social  History Narrative  . Not on file    Family History:   Family History  Problem Relation Age of Onset  . Heart disease Mother   . Stroke Father   . Hypertension Daughter   .  Hypertension Son   . Diabetes Sister   . Hypertension Son    Family Status:  Family Status  Relation Name Status  . Mother  Deceased  . Father  Deceased  . Sister [employer]  . Brother Francee Piccolo Deceased       liver disease  . Daughter Darla Lesches  . Son Juanda Crumble Deceased  . Brother [employer]  . Sister [employer]  . Son Jiles Prows Alive       Emphysema  . Son Arts administrator Alive    ROS:  Please see the history of present illness.  All other ROS reviewed and negative.     Physical Exam/Data:   Vitals:   04/17/18 1950 04/18/18 0009 04/18/18 0618 04/18/18 1222  BP: (!) 153/106 (!) 162/93 (!) 174/101 (!) 190/111  Pulse: 88 70 83 76  Resp: 18 (!) 22 (!) 22 (!) 24  Temp: 98.1 F (36.7 C) 97.6 F (36.4 C) 98.2 F (36.8 C) 99.3 F (37.4 C)  TempSrc: Oral Oral Oral Oral  SpO2: 97% 94% 99% 98%  Weight:   56.6 kg   Height:        Intake/Output Summary (Last 24 hours) at 04/18/2018 1318 Last data filed at 04/18/2018 1024 Gross per 24 hour  Intake 640 ml  Output 1950 ml  Net -1310 ml   Filed Weights   04/17/18 1417 04/18/18 0618  Weight: 56.5 kg 56.6 kg   Body mass index is 22.82 kg/m.   General: Frail, elderly, NAD Skin: Warm, dry, intact  Head: Normocephalic, atraumatic, clear, moist mucus membranes. Neck: Negative for carotid bruits. + JVD Lungs: Diminished in bilateral upper and lower lobes. No wheezes, rales, or rhonchi. Breathing is unlabored. Cardiovascular: Irregularly irregular with S1 S2. + murmur. No rubs, gallops, or LV heave appreciated. Abdomen: Soft, non-tender, non-distended with normoactive bowel sounds. No obvious abdominal masses. MSK: Strength and tone appear normal for age. 5/5 in all extremities Extremities: No edema. No clubbing or cyanosis. DP/PT pulses 2+ bilaterally Neuro:  Alert and oriented to person and place. No focal deficits. No facial asymmetry. MAE spontaneously. Psych: Responds to questions somewhat appropriately with normal affect.    EKG:  The EKG was personally reviewed and demonstrates: 04/17/2018 atrial fibrillation HR 92 with no acute ischemic changes, similar to prior tracings Telemetry:  Telemetry was personally reviewed and demonstrates:  04/18/18 Atrial fibrillation HR 80-90's   Relevant CV Studies:  ECHO: 03/18/2018: Study Conclusions  - Left ventricle: The cavity size was normal. Wall thickness was   increased in a pattern of moderate LVH. Systolic function was   normal. The estimated ejection fraction was in the range of 60%   to 65%. Wall motion was normal; there were no regional wall   motion abnormalities. The study was not technically sufficient to   allow evaluation of LV diastolic dysfunction due to atrial   fibrillation. - Aortic valve: Trileaflet; mildly calcified leaflets. There was no   stenosis. There was moderate regurgitation. - Aorta: Mildly dilated aortic root and ascending aorta. Ascending   aorta dimension: 40 mm. Aortic root dimension: 37 mm (ED). - Mitral valve: Mildly calcified annulus. Mildly calcified leaflets   . There was mild regurgitation. - Left atrium: The atrium was moderately dilated. - Right ventricle: The cavity size was mildly to moderately   dilated. Systolic function was normal. - Right atrium: The atrium was severely dilated. - Tricuspid valve: Peak RV-RA gradient (S): 54 mm Hg. - Pulmonary arteries: PA peak pressure: 62 mm  Hg (S). - Systemic veins: IVC measured 1.9 cm with < 50% respirophasic   variation, suggesting RA pressure 8 mmHg.  Impressions:  - The patient was in atrial fibrillation. Normal LV size with   moderate LV hypertrophy. EF 60-65%. Mild to moderate RV dilation   with normal systolic function. Mild MR, moderate aortic   insufficiency. Moderate pulmonary hypertension.  Biatrial   enlargement.  CATH: None  Laboratory Data:  Chemistry Recent Labs  Lab 04/17/18 1034 04/18/18 0513  NA 145 145  K 4.5 3.2*  CL 107 105  CO2 26 31  GLUCOSE 111* 102*  BUN 26* 26*  CREATININE 1.76* 1.55*  CALCIUM 8.9 8.3*  GFRNONAA 25* 29*  GFRAA 29* 34*  ANIONGAP 12 9    Total Protein  Date Value Ref Range Status  04/18/2018 5.7 (L) 6.5 - 8.1 g/dL Final  12/03/2013 7.0 6.0 - 8.5 g/dL Final   Albumin  Date Value Ref Range Status  04/18/2018 2.5 (L) 3.5 - 5.0 g/dL Final  12/03/2013 4.3 3.5 - 4.7 g/dL Final   AST  Date Value Ref Range Status  04/18/2018 26 15 - 41 U/L Final   ALT  Date Value Ref Range Status  04/18/2018 23 0 - 44 U/L Final   Alkaline Phosphatase  Date Value Ref Range Status  04/18/2018 106 38 - 126 U/L Final   Total Bilirubin  Date Value Ref Range Status  04/18/2018 0.8 0.3 - 1.2 mg/dL Final   Hematology Recent Labs  Lab 04/17/18 1034 04/18/18 0513  WBC 7.6 8.0  RBC 4.18 3.83*  HGB 11.3* 10.4*  HCT 33.6* 30.2*  MCV 80.4 78.9  MCH 27.0 27.2  MCHC 33.6 34.4  RDW 16.4* 15.9*  PLT 232 211   Cardiac EnzymesNo results for input(s): TROPONINI in the last 168 hours.  Recent Labs  Lab 04/17/18 1051  TROPIPOC 0.03    BNP Recent Labs  Lab 04/17/18 1034  BNP 2,577.0*    DDimer No results for input(s): DDIMER in the last 168 hours. TSH:  Lab Results  Component Value Date   TSH 2.192 04/17/2018   Lipids: Lab Results  Component Value Date   CHOL 224 (H) 03/19/2018   HDL 53 03/19/2018   LDLCALC 156 (H) 03/19/2018   TRIG 77 03/19/2018   CHOLHDL 4.2 03/19/2018   HgbA1c: Lab Results  Component Value Date   HGBA1C 5.1 03/19/2018    Radiology/Studies:  Dg Chest 2 View  Result Date: 04/17/2018 CLINICAL DATA:  Shortness of breath.  CHF. EXAM: CHEST - 2 VIEW COMPARISON:  03/19/2018. FINDINGS: Cardiomegaly with diffuse prominent bilateral pulmonary infiltrates/edema and bilateral pleural effusions. Findings  consistent congestive heart failure. Bilateral moaning cannot be excluded. IMPRESSION: Findings consistent congestive heart failure with prominent bilateral pulmonary edema and bilateral pleural effusions. Bilateral pneumonia cannot be excluded. Electronically Signed   By: Marcello Moores  Register   On: 04/17/2018 11:44   Assessment and Plan:   1.  Acute respiratory distress secondary to acute on chronic diastolic CHF: -Patient had recent prolonged hospitalization for acute on chronic CHF exacerbation on 7/82/9562 complicated by pleural effusions requiring thoracentesis and a subacute small right thalamic ICH, likely combination of hypertension and cerebral amyloid angiopathy, followed by neurology>>>she was ultimately discharged to [facility] rehab facility in which she stayed until 04/14/18. Once home, she became acutely SOB and fatigued and presented to [facility] on 04/17/18 -Last echocardiogram 03/18/2018 with LVEF of 60 to 65%, no wall motion abnormality, moderate LVH, mild to moderate RV  dilation with normal systolic function, mild MR, moderate aortic insufficiency and moderate pulmonary hypertension with biatrial enlargement -Continue fluid restriction, strict I&O and daily weights -Elevated BNP, 2577>>last hospital admission, 584 -CXR significant for fluid volume overload with prominent bilateral pulmonary edema and bilateral pleural effusions. -Weight, 124lb today, last office note from 10/2017 patient was approximately 129lb -I&O, net -1.3 L -Continue IV Lasix 40 mg every 8 hour and monitor renal function closely -Creatinine, 1.55 today with a baseline of 1.2-1.4 -Pt resting comfortably now on 2 L  -Will need to address home diuretics closer to d/c as she is on lasix 20mg  PRN for fluid volume overload. Will likely need 40mg  daily at minimum to keep her stable at home.   2.  Acute on chronic kidney disease stage II: -Creatinine, 1.55 today with a baseline of 1.2-1.4 -Likely in the setting of fluid  volume overload -Continue IV diuresis and follow with daily BMET  -Continue to avoid nephrotoxic medications  3.  Essential hypertension: -Elevated, 190/111, 124/101, 162/93 -On home isosorbide mononitrate 30 mg daily, Toprol XL 25 mg daily, diltiazem 30 mg twice daily -Nitropaste and hydralazine added this admission -Will increase Toprol-XL to 50mg  and monitor for response given significant labile hypertension and stable HR -Continue medication titration as tolerated    4.  Permanent atrial fibrillation: -Rates well controlled on diltiazem 30 mg twice daily, Toprol-XL 25 mg daily -Not anticoagulated secondary to history of bleeding, recent ICH  -CHA2DS2VASc =3  5.  DM2: -SSI for glucose control while inpatient status -Hemoglobin A1c, stable at 5.1 on 03/19/2018 -Per primary team  6.  History of dementia: -Continue Aricept, Zoloft -Primary team  7. Hx of recent [place], discharged 03/26/18: -Subacute small right thalamic ICH, likely combination of hypertension and cerebral amyloid angiopathy, followed by neurology during last hospitalization  -Discharged to rehabilitation facility and home for two days when she presented to [facility] for worsening SOB found to be significantly fluid volume overloaded    For questions or updates, please contact [place] Please consult [email or web address] for contact info under Cardiology/STEMI.   Lyndel Safe NP-C [facility] Pager: [phone] 04/18/2018 1:18 PM  Personally seen and examined. Agree with above.  83 year old female here with acute on chronic diastolic heart failure with bilateral pleural effusions, failure to thrive, dementia, unable to anticoagulate atrial fibrillation due to intracranial hemorrhage, thoracic aortic aneurysm, recent stroke here with shortness of breath consistent with acute diastolic heart failure.  Her caregiver states that she was taking Lasix 20 mg twice a day at home.  On the home med list it says 20 mg as  needed.  She seems to be resting fairly comfortably in bed.  Her weight is down.  Elderly, alert, dementia noted, irregularly irregular heart rhythm, soft systolic murmur heard at right upper sternal border, lungs with blunting at bases, normal respiratory effort abdomen is soft, cachectic.  No significant edema.  Last echo showed EF of 60%. EKG and telemetry personally reviewed and interpreted shows atrial fibrillation with rate control. Ascending aorta 40 mm on echocardiogram.  Acute on chronic diastolic heart failure/acute respiratory failure - Seems to be improving slowly with Lasix.  Continue with IV Lasix 40 mg every 8 hours.  Creatinine currently is 1.5 up from baseline of 1.4.  Willing to tolerate at this point. -When ready to transition to p.o., I would suggest 40 mg twice a day for maintenance as 20 mg twice a day has not been sufficient it seems. -Of course continue with fluid  management, salt restriction.  Hypertensive heart disease with heart failure - Blood pressures can be quite labile, highly elevated.  We have increased her Toprol from 25-50 to assist with this as well as assist with her atrial fibrillation heart rate.  Permanent atrial fibrillation -Continue with rate control.  Unfortunately she is not a candidate for anticoagulation given her intracranial hemorrhage.  Dementia - Progressive  Candee Furbish, MD

## 2018-04-18 NOTE — Evaluation (Signed)
Physical Therapy Evaluation Patient Details Name: Nicole [name] MRN: [id number] DOB: September 28, 1930 Today's Date: 04/18/2018   History of Present Illness  84 year old female with extensive past medical history including dementia, atrial fibrillation, CHF, CKD, T2DM, chronic respiratory failure on 3L home O2 who p/w shortness of breath.  Patient called EMS because she was feeling short of breath when she was laying in bed this morning.  She was recently discharged from rehab after admission there from recent stroke with left-sided weakness.  When asked about chest pain or abd pain, she states that she has some that "comes and goes."     Clinical Impression  Pt admitted with above diagnosis. Pt currently with functional limitations due to the deficits listed below (see PT Problem List). Pt reports feeling weaker than baseline, unable to sit up or stand without max A from staff. Pt with hx of dementia unreliable historian, feel strongly she will need SNF placement.  Pt will benefit from skilled PT to increase their independence and safety with mobility to allow discharge to the venue listed below.       Follow Up Recommendations SNF    Equipment Recommendations  (TBD next venue)    Recommendations for Other Services       Precautions / Restrictions Precautions Precautions: Fall Restrictions Weight Bearing Restrictions: No      Mobility  Bed Mobility Overal bed mobility: Needs Assistance Bed Mobility: Sit to Supine;Supine to Sit     Supine to sit: Max assist Sit to supine: Max assist      Transfers Overall transfer level: Needs assistance Equipment used: Rolling walker (2 wheeled) Transfers: Sit to/from Stand Sit to Stand: Max assist;+2 physical assistance         General transfer comment: max A to power up into walker.   Ambulation/Gait             General Gait Details: unable   Stairs            Wheelchair Mobility    Modified Rankin (Stroke Patients  Only)       Balance Overall balance assessment: Needs assistance   Sitting balance-Leahy Scale: Fair       Standing balance-Leahy Scale: Poor                               Pertinent Vitals/Pain Pain Assessment: No/denies pain    Home Living Family/patient expects to be discharged to:: Skilled nursing facility Living Arrangements: Spouse/significant other                    Prior Function Level of Independence: Needs assistance         Comments: pt sleepy and hx of dementia, returned home from SNF per chart and reports she had difficulty walking      Hand Dominance   Dominant Hand: Right    Extremity/Trunk Assessment   Upper Extremity Assessment Upper Extremity Assessment: Generalized weakness    Lower Extremity Assessment Lower Extremity Assessment: Generalized weakness       Communication   Communication: No difficulties  Cognition Arousal/Alertness: Awake/alert Behavior During Therapy: WFL for tasks assessed/performed Overall Cognitive Status: Within Functional Limits for tasks assessed                                        General Comments  Exercises     Assessment/Plan    PT Assessment Patient needs continued PT services  PT Problem List Decreased strength;Decreased range of motion;Decreased activity tolerance;Decreased balance;Decreased coordination;Decreased cognition       PT Treatment Interventions DME instruction;Gait training;Stair training;Functional mobility training;Therapeutic exercise;Therapeutic activities;Balance training    PT Goals (Current goals can be found in the Care Plan section)  Acute Rehab PT Goals Patient Stated Goal: rest, get warmer  PT Goal Formulation: With patient Time For Goal Achievement: 04/25/18 Potential to Achieve Goals: Fair    Frequency Min 3X/week   Barriers to discharge Decreased caregiver support      Co-evaluation               AM-PAC PT "6  Clicks" Daily Activity  Outcome Measure Difficulty turning over in bed (including adjusting bedclothes, sheets and blankets)?: Unable Difficulty moving from lying on back to sitting on the side of the bed? : Unable Difficulty sitting down on and standing up from a chair with arms (e.g., wheelchair, bedside commode, etc,.)?: Unable Help needed moving to and from a bed to chair (including a wheelchair)?: Total Help needed walking in hospital room?: Total Help needed climbing 3-5 steps with a railing? : Total 6 Click Score: 6    End of Session Equipment Utilized During Treatment: Gait belt Activity Tolerance: Patient tolerated treatment well Patient left: in bed;with call bell/phone within reach;with bed alarm set;with nursing/sitter in room Nurse Communication: Mobility status PT Visit Diagnosis: Unsteadiness on feet (R26.81);Muscle weakness (generalized) (M62.81);History of falling (Z91.81)    Time: 9191-6606 PT Time Calculation (min) (ACUTE ONLY): 26 min   Charges:   PT Evaluation $PT Eval Moderate Complexity: 1 Mod          Reinaldo Berber, PT, DPT Acute Rehabilitation Services Pager: [phone] Office: [phone]    Reinaldo Berber 04/18/2018, 11:14 AM

## 2018-04-18 NOTE — Progress Notes (Signed)
CSW received consult for SNF placement. Patient recently discharged from SNF and was cut off by insurance. If patient wishes to pursue SNF, she will have to privately pay $170/day if [facility] approves her again. [facility] is closed on the weekend, so CSW will touch base with patient on Monday.   Percell Locus Keion Neels LCSW [phone]

## 2018-04-18 NOTE — Evaluation (Signed)
Occupational Therapy Evaluation Patient Details Name: Nicole [name] MRN: [id number] DOB: Dec 23, 1930 Today's Date: 04/18/2018    History of Present Illness 83 year old female with extensive past medical history including dementia, atrial fibrillation, CHF, CKD, T2DM, chronic respiratory failure on 3L home O2 who p/w shortness of breath.  Patient called EMS because she was feeling short of breath when she was laying in bed this morning.  She was recently discharged from rehab after admission there from recent stroke with left-sided weakness.  When asked about chest pain or abd pain, she states that she has some that "comes and goes."   Clinical Impression   Pt reports using a walker. She reports her boyfriend assists her with "everything."  Pt presents with lethargy on arrival, but became more alert once seated at EOB. Pt requires 2 person assist for mobility and total assist for ADL. She has significant weakness, poor balance, impaired cognition and appears to have L inattention.  Recommending SNF upon discharge. Will follow acutely.    Follow Up Recommendations  SNF;Supervision/Assistance - 24 hour    Equipment Recommendations  None recommended by OT    Recommendations for Other Services       Precautions / Restrictions Precautions Precautions: Fall Restrictions Weight Bearing Restrictions: No      Mobility Bed Mobility Overal bed mobility: Needs Assistance Bed Mobility: Sit to Supine;Supine to Sit     Supine to sit: +2 for physical assistance;Max assist Sit to supine: Max assist   General bed mobility comments: assist for all aspects, no attempt to self assist  Transfers Overall transfer level: Needs assistance Equipment used: Rolling walker (2 wheeled) Transfers: Sit to/from Stand Sit to Stand: Max assist;+2 physical assistance         General transfer comment: max A to power up into walker.     Balance Overall balance assessment: Needs assistance   Sitting  balance-Leahy Scale: Poor Sitting balance - Comments: R side lean as she fatigues   Standing balance support: Bilateral upper extremity supported Standing balance-Leahy Scale: Poor Standing balance comment: requires B UE support and external assist                           ADL either performed or assessed with clinical judgement   ADL                                         General ADL Comments: requires total assist, assisted pt with feeding     Vision   Additional Comments: L inattention?     Perception     Praxis      Pertinent Vitals/Pain Pain Assessment: No/denies pain     Hand Dominance Right   Extremity/Trunk Assessment Upper Extremity Assessment Upper Extremity Assessment: Generalized weakness(minimal functional use)   Lower Extremity Assessment Lower Extremity Assessment: Defer to PT evaluation       Communication Communication Communication: No difficulties   Cognition Arousal/Alertness: Awake/alert Behavior During Therapy: Flat affect Overall Cognitive Status: Impaired/Different from baseline Area of Impairment: Orientation;Attention;Memory;Following commands;Safety/judgement;Awareness;Problem solving                 Orientation Level: Disoriented to;Place;Time;Situation Current Attention Level: Sustained Memory: Decreased short-term memory Following Commands: Follows one step commands with increased time;Follows one step commands inconsistently Safety/Judgement: Decreased awareness of safety;Decreased awareness of deficits Awareness: Intellectual Problem Solving: Slow processing;Decreased  initiation;Difficulty sequencing;Requires verbal cues;Requires tactile cues     General Comments       Exercises     Shoulder Instructions      Home Living Family/patient expects to be discharged to:: Skilled nursing facility Living Arrangements: Spouse/significant other                                Additional Comments: lives with her boyfriend      Prior Functioning/Environment Level of Independence: Needs assistance        Comments: pt sleepy and hx of dementia, returned home from SNF per chart and reports she had difficulty walking         OT Problem List: Decreased strength;Decreased activity tolerance;Impaired balance (sitting and/or standing);Decreased knowledge of use of DME or AE;Decreased safety awareness;Decreased cognition;Cardiopulmonary status limiting activity;Impaired UE functional use      OT Treatment/Interventions: Self-care/ADL training;DME and/or AE instruction;Cognitive remediation/compensation;Patient/family education;Balance training;Therapeutic activities;Therapeutic exercise    OT Goals(Current goals can be found in the care plan section) Acute Rehab OT Goals Patient Stated Goal: rest, get warmer  OT Goal Formulation: Patient unable to participate in goal setting Time For Goal Achievement: 05/02/18 Potential to Achieve Goals: Good ADL Goals Pt Will Perform Eating: with min assist;sitting Pt Will Perform Grooming: with min assist;sitting Pt Will Perform Upper Body Dressing: with min assist;sitting Pt Will Transfer to Toilet: with min assist;ambulating;bedside commode Additional ADL Goal #1: Pt will perform bed mobility with min assist in preparation for ADL.  OT Frequency: Min 2X/week   Barriers to D/C:            Co-evaluation PT/OT/SLP Co-Evaluation/Treatment: Yes Reason for Co-Treatment: For patient/therapist safety          AM-PAC PT "6 Clicks" Daily Activity     Outcome Measure Help from another person eating meals?: Total Help from another person taking care of personal grooming?: Total Help from another person toileting, which includes using toliet, bedpan, or urinal?: Total Help from another person bathing (including washing, rinsing, drying)?: Total Help from another person to put on and taking off regular upper body clothing?:  Total Help from another person to put on and taking off regular lower body clothing?: Total 6 Click Score: 6   End of Session Equipment Utilized During Treatment: Gait belt;Rolling walker;Oxygen Nurse Communication: Mobility status  Activity Tolerance: Patient limited by fatigue Patient left: in bed;with call bell/phone within reach;with bed alarm set  OT Visit Diagnosis: Unsteadiness on feet (R26.81);Other abnormalities of gait and mobility (R26.89);Muscle weakness (generalized) (M62.81);Other symptoms and signs involving cognitive function                Time: 5697-9480 OT Time Calculation (min): 31 min Charges:  OT General Charges $OT Visit: 1 Visit OT Evaluation $OT Eval Moderate Complexity: 1 Mod  Nestor Lewandowsky, OTR/L Acute Rehabilitation Services Pager: [phone] Office: [phone]  Malka So 04/18/2018, 11:24 AM

## 2018-04-19 LAB — BASIC METABOLIC PANEL
ANION GAP: 11 (ref 5–15)
BUN: 18 mg/dL (ref 8–23)
CALCIUM: 8.5 mg/dL — AB (ref 8.9–10.3)
CO2: 32 mmol/L (ref 22–32)
Chloride: 98 mmol/L (ref 98–111)
Creatinine, Ser: 1.16 mg/dL — ABNORMAL HIGH (ref 0.44–1.00)
GFR, EST AFRICAN AMERICAN: 48 mL/min — AB (ref >60)
GFR, EST NON AFRICAN AMERICAN: 42 mL/min — AB (ref >60)
GLUCOSE: 92 mg/dL (ref 70–99)
POTASSIUM: 3.5 mmol/L (ref 3.5–5.1)
Sodium: 141 mmol/L (ref 135–145)

## 2018-04-19 LAB — GLUCOSE, CAPILLARY
GLUCOSE-CAPILLARY: 90 mg/dL (ref 70–99)
Glucose-Capillary: 206 mg/dL — ABNORMAL HIGH (ref 70–99)
Glucose-Capillary: 135 mg/dL — ABNORMAL HIGH (ref 70–99)

## 2018-04-19 LAB — VITAMIN D 25 HYDROXY (VIT D DEFICIENCY, FRACTURES): VIT D 25 HYDROXY: 9 ng/mL — AB (ref 30.0–100.0)

## 2018-04-19 MED ORDER — VITAMIN B-12 100 MCG PO TABS
100.0000 ug | ORAL_TABLET | Freq: Every day | ORAL | Status: DC
  Administered 2018-04-19 – 2018-04-25 (×7): 100 ug via ORAL
  Filled 2018-04-19 (×7): qty 1

## 2018-04-19 MED ORDER — POTASSIUM CHLORIDE CRYS ER 20 MEQ PO TBCR
40.0000 meq | EXTENDED_RELEASE_TABLET | Freq: Once | ORAL | Status: AC
  Administered 2018-04-19: 40 meq via ORAL
  Filled 2018-04-19: qty 2

## 2018-04-19 MED ORDER — SENNOSIDES-DOCUSATE SODIUM 8.6-50 MG PO TABS
1.0000 | ORAL_TABLET | Freq: Every day | ORAL | Status: DC
  Administered 2018-04-19 – 2018-04-25 (×6): 1 via ORAL
  Filled 2018-04-19 (×7): qty 1

## 2018-04-19 MED ORDER — POTASSIUM CHLORIDE CRYS ER 20 MEQ PO TBCR
20.0000 meq | EXTENDED_RELEASE_TABLET | Freq: Once | ORAL | Status: AC
  Administered 2018-04-19: 20 meq via ORAL
  Filled 2018-04-19: qty 1

## 2018-04-19 MED ORDER — FUROSEMIDE 10 MG/ML IJ SOLN
40.0000 mg | Freq: Two times a day (BID) | INTRAMUSCULAR | Status: DC
  Administered 2018-04-19 – 2018-04-23 (×8): 40 mg via INTRAVENOUS
  Filled 2018-04-19 (×8): qty 4

## 2018-04-19 MED ORDER — VITAMIN D (ERGOCALCIFEROL) 1.25 MG (50000 UNIT) PO CAPS
50000.0000 [IU] | ORAL_CAPSULE | ORAL | Status: DC
  Administered 2018-04-19: 50000 [IU] via ORAL
  Filled 2018-04-19: qty 1

## 2018-04-19 MED ORDER — HYDRALAZINE HCL 50 MG PO TABS
50.0000 mg | ORAL_TABLET | Freq: Three times a day (TID) | ORAL | Status: DC
  Administered 2018-04-19 – 2018-04-21 (×6): 50 mg via ORAL
  Filled 2018-04-19 (×6): qty 1

## 2018-04-19 MED ORDER — MAGNESIUM OXIDE 400 (241.3 MG) MG PO TABS
200.0000 mg | ORAL_TABLET | Freq: Two times a day (BID) | ORAL | Status: DC
  Administered 2018-04-19 – 2018-04-25 (×12): 200 mg via ORAL
  Filled 2018-04-19 (×12): qty 1

## 2018-04-19 MED ORDER — ENSURE ENLIVE PO LIQD
237.0000 mL | Freq: Two times a day (BID) | ORAL | Status: DC
  Administered 2018-04-19 – 2018-04-20 (×4): 237 mL via ORAL

## 2018-04-19 NOTE — Progress Notes (Signed)
Physical Therapy Treatment Patient Details Name: Nicole [name] MRN: [id number] DOB: [birth year] Today's Date: 04/19/2018    History of Present Illness Pt is an 84 year old female with extensive past medical history including dementia, atrial fibrillation, CHF, CKD, T2DM, chronic respiratory failure on 3L home O2 who p/w shortness of breath.  Patient called EMS because she was feeling short of breath when she was laying in bed this morning.  She was recently discharged from rehab after admission there from recent stroke with left-sided weakness.  When asked about chest pain or abd pain, she states that she has some that "comes and goes."    PT Comments    Pt making slow progress with functional mobility. She was able to perform sit<>stand transfers from [facility] with mod A x2 and was able to take a couple of steps forwards with min-mod A using RW. Pt would continue to benefit from skilled physical therapy services at this time while admitted and after d/c to address the below listed limitations in order to improve overall safety and independence with functional mobility.    Follow Up Recommendations  SNF     Equipment Recommendations  None recommended by PT    Recommendations for Other Services       Precautions / Restrictions Precautions Precautions: Fall Restrictions Weight Bearing Restrictions: No    Mobility  Bed Mobility               General bed mobility comments: pt OOB on BSC upon arrival  Transfers Overall transfer level: Needs assistance Equipment used: Rolling walker (2 wheeled) Transfers: Sit to/from Stand Sit to Stand: Mod assist;+2 safety/equipment;+2 physical assistance         General transfer comment: increased time, assist to power into standing from [facility], assist for stability  Ambulation/Gait             General Gait Details: pt able to take a couple of steps forwards with RW and min-mod A for stability   Stairs             Wheelchair  Mobility    Modified Rankin (Stroke Patients Only)       Balance Overall balance assessment: Needs assistance Sitting-balance support: Feet supported Sitting balance-Leahy Scale: Fair     Standing balance support: Bilateral upper extremity supported Standing balance-Leahy Scale: Poor Standing balance comment: requires B UE support and external assist                            Cognition Arousal/Alertness: Awake/alert Behavior During Therapy: Flat affect Overall Cognitive Status: History of cognitive impairments - at baseline Area of Impairment: Following commands;Safety/judgement;Problem solving;Awareness;Memory                     Memory: Decreased short-term memory Following Commands: Follows one step commands with increased time Safety/Judgement: Decreased awareness of deficits;Decreased awareness of safety Awareness: Intellectual Problem Solving: Slow processing;Decreased initiation;Difficulty sequencing;Requires verbal cues;Requires tactile cues        Exercises      General Comments        Pertinent Vitals/Pain Pain Assessment: No/denies pain    Home Living                      Prior Function            PT Goals (current goals can now be found in the care plan section) Acute Rehab PT Goals PT Goal  Formulation: With patient Time For Goal Achievement: 04/25/18 Potential to Achieve Goals: Fair Progress towards PT goals: Progressing toward goals    Frequency    Min 3X/week      PT Plan Current plan remains appropriate    Co-evaluation              AM-PAC PT "6 Clicks" Daily Activity  Outcome Measure  Difficulty turning over in bed (including adjusting bedclothes, sheets and blankets)?: Unable Difficulty moving from lying on back to sitting on the side of the bed? : Unable Difficulty sitting down on and standing up from a chair with arms (e.g., wheelchair, bedside commode, etc,.)?: Unable Help needed moving  to and from a bed to chair (including a wheelchair)?: A Lot Help needed walking in hospital room?: A Lot Help needed climbing 3-5 steps with a railing? : Total 6 Click Score: 8    End of Session Equipment Utilized During Treatment: Gait belt Activity Tolerance: Patient tolerated treatment well Patient left: in chair;with call bell/phone within reach;with chair alarm set;with family/visitor present Nurse Communication: Mobility status PT Visit Diagnosis: Unsteadiness on feet (R26.81);Muscle weakness (generalized) (M62.81);History of falling (Z91.81)     Time: 6378-5885 PT Time Calculation (min) (ACUTE ONLY): 16 min  Charges:  $Therapeutic Activity: 8-22 mins                     Sherie Don, [place], DPT  Acute Rehabilitation Services Pager [phone] Office [place] 04/19/2018, 11:49 AM

## 2018-04-19 NOTE — Progress Notes (Signed)
PROGRESS NOTE    Nicole [name]  OMV:[id number] DOB: [birth year] DOA: 04/17/2018 PCP: [name], DO    Brief Narrative: Nicole [name] is a 84 y.o. female with medical history significant of with history of chronic hypoxia on 3 L nasal cannula at home, diastolic congestive heart failure, chronic atrial fibrillation, dementia, CKD stage III, diabetes mellitus type 2, essential hypertension was brought to the hospital by family for evaluation of generalized weakness and shortness of breath.  Patient was here about 3 weeks ago and treated for CHF exacerbation.  She was discharged to [facility] rehab place where she resided for about 3 weeks and went home 2 days ago.  After going home she continued to feel very weak and unable to get out of bed much, husband noted that she was also getting dyspneic.  She does not really take Lasix at home and its prescribed as needed.  Continue using 3 L nasal cannula.  Husband states patient is compliant with her medication for the once which she is prescribed.  Has not really followed up with a cardiologist. Patient is slightly confused at this time therefore history is somewhat limited from her.  Most of the history is provided by the sister and husband present at bedside.  In the ER patient was noted to have elevated blood pressure systolic greater than 628 which Nitropaste was placed on her.  She was also noted to have physical signs of fluid overload with elevated JVD and diminished breath sounds.  She also had elevated BNP.  40 mg of IV Lasix was given and medical team was requested to admit the patient.  When I saw the patient she was sitting comfortably at rest and did not have any active complaints but did admit to feeling extremely weak.   Assessment & Plan:   Principal Problem:   Acute respiratory distress Active Problems:   History of colon cancer, stage III   Diabetic nephropathy (HCC)   Hypertensive urgency   Type 2 diabetes mellitus with  peripheral neuropathy (HCC)   Essential hypertension   General weakness   GERD (gastroesophageal reflux disease)   Acute diastolic CHF (congestive heart failure), NYHA class 3 (HCC)   Acute diastolic heart failure exacerbation;  Acute respiratory distress.  Continue with IV lasix 40 mg IV TID>  ECHO with diastolic dysfunction, moderate aortic insufficiency and mild mitral regurgitation.  Cardiology consulted.  BNP 2577. Chest x ray ; bilateral pulmonary edema , pleural effusion.  Urine out put 2.6 l.  Weight 124---115 Improved, continue with IV lasix.   HTN urgency;  Continue with Cardizem, IV lasix, imdur, nitro paste.  Start oral hydralazine and PRN IV hydralazine.  Cardiology increase hydralazine to 50 mg.  Blood pressure better controlled.   Generalize weakness; multifactorial, CHF, deconditioning.  More alert today.  PT evaluation.   AKI on CKD stage III;  Suspect related to heart failure.  monitor on lasix.  Cr trending down. Today at 1.1.   DM; Hypoglycemia. Hold insulin.   Hypothyroidism;  Continue with synthroid.   Chronc A fib;  No anticoagulation due to recent ICH.  Continue with Cardizem.  Continue with metoprolol/ metoprolol dose increase today   History of dementia.  Continue with Aricept.   Moderate severe protein calorie malnutrition -Encourage oral diet.  History of [place] discharge 3-66; complicated by seizure.  On Vimpat for seizure.   Hypomagnesemia; repeat labs in am.  Start oral supplement. Received IV mg 9-27  Hypokalemia ; replete orally  Vitamin D deficiency and Vitamin B 12 low normal range.  Start supplement  DVT prophylaxis: SCD.  Code Status: DNR Family Communication: husband at bedside.  Disposition Plan: remain inpatient for management of HF, HTN urgency   Consultants:   Cardiology    Procedures:   none   Antimicrobials: none   Subjective: Alert today, recognized husband. Breathing better feeling better.    Objective: Vitals:   04/18/18 1606 04/18/18 2114 04/19/18 0518 04/19/18 0923  BP: (!) 156/105 (!) 174/97 (!) 160/90 (!) 152/90  Pulse: 60 86 (!) 108 (!) 106  Resp: (!) 22  20   Temp: 98.2 F (36.8 C)  98.5 F (36.9 C)   TempSrc: Oral  Oral   SpO2: 98%  95%   Weight:   52.3 kg   Height:        Intake/Output Summary (Last 24 hours) at 04/19/2018 1101 Last data filed at 04/19/2018 0950 Gross per 24 hour  Intake 720 ml  Output 1950 ml  Net -1230 ml   Filed Weights   04/17/18 1417 04/18/18 0618 04/19/18 0518  Weight: 56.5 kg 56.6 kg 52.3 kg    Examination:  General exam: Alert,  Respiratory system: normal respiratory effort, bilateral crackles.  Cardiovascular system: S 1, S 2 RRR Gastrointestinal system: BS present, soft, nt Central nervous system: alert, follows command.  Extremities: trace edema Skin: no rashes.     Data Reviewed: I have personally reviewed following labs and imaging studies  CBC: Recent Labs  Lab 04/17/18 1034 04/18/18 0513  WBC 7.6 8.0  NEUTROABS 5.5  --   HGB 11.3* 10.4*  HCT 33.6* 30.2*  MCV 80.4 78.9  PLT 232 284   Basic Metabolic Panel: Recent Labs  Lab 04/17/18 1034 04/18/18 0513 04/19/18 0740  NA 145 145 141  K 4.5 3.2* 3.5  CL 107 105 98  CO2 26 31 32  GLUCOSE 111* 102* 92  BUN 26* 26* 18  CREATININE 1.76* 1.55* 1.16*  CALCIUM 8.9 8.3* 8.5*  MG  --  1.4*  --    GFR: Estimated Creatinine Clearance: 28 mL/min (A) (by C-G formula based on SCr of 1.16 mg/dL (H)). Liver Function Tests: Recent Labs  Lab 04/17/18 1034 04/18/18 0513  AST 38 26  ALT 31 23  ALKPHOS 123 106  BILITOT 1.2 0.8  PROT 6.6 5.7*  ALBUMIN 2.8* 2.5*   No results for input(s): LIPASE, AMYLASE in the last 168 hours. No results for input(s): AMMONIA in the last 168 hours. Coagulation Profile: Recent Labs  Lab 04/18/18 0513  INR 1.38   Cardiac Enzymes: No results for input(s): CKTOTAL, CKMB, CKMBINDEX, TROPONINI in the last 168 hours. BNP  (last 3 results) No results for input(s): PROBNP in the last 8760 hours. HbA1C: No results for input(s): HGBA1C in the last 72 hours. CBG: Recent Labs  Lab 04/18/18 1726 04/18/18 2106 04/18/18 2126 04/18/18 2159 04/19/18 0830  GLUCAP 225* 38* 63* 102* 90   Lipid Profile: No results for input(s): CHOL, HDL, LDLCALC, TRIG, CHOLHDL, LDLDIRECT in the last 72 hours. Thyroid Function Tests: Recent Labs    04/17/18 1034  TSH 2.192  FREET4 0.85   Anemia Panel: Recent Labs    04/18/18 1024  VITAMINB12 363   Sepsis Labs: No results for input(s): PROCALCITON, LATICACIDVEN in the last 168 hours.  No results found for this or any previous visit (from the past 240 hour(s)).       Radiology Studies: Dg Chest 2 View  Result Date:  04/17/2018 CLINICAL DATA:  Shortness of breath.  CHF. EXAM: CHEST - 2 VIEW COMPARISON:  03/19/2018. FINDINGS: Cardiomegaly with diffuse prominent bilateral pulmonary infiltrates/edema and bilateral pleural effusions. Findings consistent congestive heart failure. Bilateral moaning cannot be excluded. IMPRESSION: Findings consistent congestive heart failure with prominent bilateral pulmonary edema and bilateral pleural effusions. Bilateral pneumonia cannot be excluded. Electronically Signed   By: Marcello Moores  Register   On: 04/17/2018 11:44        Scheduled Meds: . diltiazem  30 mg Oral BID  . donepezil  10 mg Oral QHS  . feeding supplement (ENSURE ENLIVE)  237 mL Oral BID BM  . ferrous sulfate  325 mg Oral BID WC  . furosemide  40 mg Intravenous BID  . hydrALAZINE  50 mg Oral Q8H  . isosorbide mononitrate  30 mg Oral BID  . lacosamide  75 mg Oral BID  . levothyroxine  25 mcg Oral QAC breakfast  . metoprolol succinate  50 mg Oral Daily  . senna-docusate  1 tablet Oral Daily  . sertraline  50 mg Oral Daily  . vitamin B-12  100 mcg Oral Daily  . Vitamin D (Ergocalciferol)  50,000 Units Oral Q7 days   Continuous Infusions:   LOS: 2 days    Time  spent: 35 minutes.     Elmarie Shiley, MD [facility] Pager [phone]  If 7PM-7AM, please contact night-coverage [email or web address] Password [email or web address] 04/19/2018, 11:01 AM

## 2018-04-19 NOTE — Progress Notes (Signed)
Progress Note  Patient Name: LATIANA TOMEI Date of Encounter: 04/19/2018  Primary Cardiologist: Pixie Casino, MD   Subjective   No CP; still dyspneic but improving  Inpatient Medications    Scheduled Meds: . diltiazem  30 mg Oral BID  . donepezil  10 mg Oral QHS  . feeding supplement (ENSURE ENLIVE)  237 mL Oral BID BM  . ferrous sulfate  325 mg Oral BID WC  . furosemide  40 mg Intravenous Q8H  . hydrALAZINE  25 mg Oral Q8H  . isosorbide mononitrate  30 mg Oral BID  . lacosamide  75 mg Oral BID  . levothyroxine  25 mcg Oral QAC breakfast  . metoprolol succinate  50 mg Oral Daily  . nitroGLYCERIN  1 inch Topical Q6H  . sertraline  50 mg Oral Daily  . vitamin B-12  100 mcg Oral Daily  . Vitamin D (Ergocalciferol)  50,000 Units Oral Q7 days   Continuous Infusions:  PRN Meds: acetaminophen **OR** acetaminophen, bisacodyl, hydrALAZINE, HYDROcodone-acetaminophen, ipratropium, ondansetron **OR** ondansetron (ZOFRAN) IV, senna-docusate   Vital Signs    Vitals:   04/18/18 1222 04/18/18 1606 04/18/18 2114 04/19/18 0518  BP: (!) 190/111 (!) 156/105 (!) 174/97 (!) 160/90  Pulse: 76 60 86 (!) 108  Resp: (!) 24 (!) 22  20  Temp: 99.3 F (37.4 C) 98.2 F (36.8 C)  98.5 F (36.9 C)  TempSrc: Oral Oral  Oral  SpO2: 98% 98%  95%  Weight:    52.3 kg  Height:        Intake/Output Summary (Last 24 hours) at 04/19/2018 0810 Last data filed at 04/18/2018 2200 Gross per 24 hour  Intake 600 ml  Output 2600 ml  Net -2000 ml   Filed Weights   04/17/18 1417 04/18/18 0618 04/19/18 0518  Weight: 56.5 kg 56.6 kg 52.3 kg    Telemetry    Atrial fibrillation, rate controlled - Personally Reviewed   Physical Exam   GEN: Frail No acute distress.   Neck: supple Cardiac: irregular Respiratory: CTA GI: Soft, nontender, non-distended  MS: No edema Neuro:  Nonfocal   Labs    Chemistry Recent Labs  Lab 04/17/18 1034 04/18/18 0513  NA 145 145  K 4.5 3.2*  CL 107 105   CO2 26 31  GLUCOSE 111* 102*  BUN 26* 26*  CREATININE 1.76* 1.55*  CALCIUM 8.9 8.3*  PROT 6.6 5.7*  ALBUMIN 2.8* 2.5*  AST 38 26  ALT 31 23  ALKPHOS 123 106  BILITOT 1.2 0.8  GFRNONAA 25* 29*  GFRAA 29* 34*  ANIONGAP 12 9     Hematology Recent Labs  Lab 04/17/18 1034 04/18/18 0513  WBC 7.6 8.0  RBC 4.18 3.83*  HGB 11.3* 10.4*  HCT 33.6* 30.2*  MCV 80.4 78.9  MCH 27.0 27.2  MCHC 33.6 34.4  RDW 16.4* 15.9*  PLT 232 211    Recent Labs  Lab 04/17/18 1051  TROPIPOC 0.03     BNP Recent Labs  Lab 04/17/18 1034  BNP 2,577.0*     Radiology    Dg Chest 2 View  Result Date: 04/17/2018 CLINICAL DATA:  Shortness of breath.  CHF. EXAM: CHEST - 2 VIEW COMPARISON:  03/19/2018. FINDINGS: Cardiomegaly with diffuse prominent bilateral pulmonary infiltrates/edema and bilateral pleural effusions. Findings consistent congestive heart failure. Bilateral moaning cannot be excluded. IMPRESSION: Findings consistent congestive heart failure with prominent bilateral pulmonary edema and bilateral pleural effusions. Bilateral pneumonia cannot be excluded. Electronically Signed  ByMarcello Moores  Register   On: 04/17/2018 11:44    Patient Profile     84 year old female here with acute on chronic diastolic heart failure with bilateral pleural effusions, failure to thrive, dementia, unable to anticoagulate atrial fibrillation due to intracranial hemorrhage, thoracic aortic aneurysm, recent stroke here with shortness of breath consistent with acute diastolic heart failure.  Last echocardiogram August 2019 showed normal LV function, moderate aortic insufficiency, mildly dilated aortic root, mild mitral regurgitation, biatrial enlargement, mild to moderate right ventricular enlargement.  Assessment & Plan    1 acute on chronic diastolic congestive heart failure-patient appears to be improving. I/O-2000; weight 52 kg.  I will decrease Lasix to 40 mg IV twice daily.  Continue fluid restriction and  low-sodium diet.  Follow renal function.  2 permanent atrial fibrillation-continue beta-blocker and Cardizem for rate control.  Patient had a recent intracranial hemorrhage.  Therefore she is not anticoagulated.  3 hypertension-blood pressure is elevated.  Increase hydralazine to 50 mg p.o. 3 times daily and follow.  4 acute on chronic stage III kidney disease-follow renal function closely with diuresis.  5 Dementia-per IM. Pt NCB.  For questions or updates, please contact [place] Please consult [email or web address] for contact info under     Signed, Kirk Ruths, MD  04/19/2018, 8:10 AM

## 2018-04-19 NOTE — Progress Notes (Signed)
MD and case manager aware of pt needing assistance at discharge

## 2018-04-20 LAB — CBC
HEMATOCRIT: 35.1 % — AB (ref 36.0–46.0)
HEMOGLOBIN: 12.1 g/dL (ref 12.0–15.0)
MCH: 27.4 pg (ref 26.0–34.0)
MCHC: 34.5 g/dL (ref 30.0–36.0)
MCV: 79.6 fL (ref 78.0–100.0)
Platelets: 226 10*3/uL (ref 150–400)
RBC: 4.41 MIL/uL (ref 3.87–5.11)
RDW: 16.9 % — AB (ref 11.5–15.5)
WBC: 6.7 10*3/uL (ref 4.0–10.5)

## 2018-04-20 LAB — BASIC METABOLIC PANEL
ANION GAP: 10 (ref 5–15)
BUN: 18 mg/dL (ref 8–23)
CALCIUM: 8.5 mg/dL — AB (ref 8.9–10.3)
CHLORIDE: 96 mmol/L — AB (ref 98–111)
CO2: 33 mmol/L — AB (ref 22–32)
Creatinine, Ser: 1.26 mg/dL — ABNORMAL HIGH (ref 0.44–1.00)
GFR calc non Af Amer: 38 mL/min — ABNORMAL LOW (ref >60)
GFR, EST AFRICAN AMERICAN: 44 mL/min — AB (ref >60)
Glucose, Bld: 94 mg/dL (ref 70–99)
Potassium: 4.1 mmol/L (ref 3.5–5.1)
Sodium: 139 mmol/L (ref 135–145)

## 2018-04-20 LAB — GLUCOSE, CAPILLARY
GLUCOSE-CAPILLARY: 85 mg/dL (ref 70–99)
Glucose-Capillary: 166 mg/dL — ABNORMAL HIGH (ref 70–99)
Glucose-Capillary: 102 mg/dL — ABNORMAL HIGH (ref 70–99)
Glucose-Capillary: 112 mg/dL — ABNORMAL HIGH (ref 70–99)

## 2018-04-20 LAB — MAGNESIUM: Magnesium: 1.4 mg/dL — ABNORMAL LOW (ref 1.7–2.4)

## 2018-04-20 MED ORDER — MAGNESIUM SULFATE 2 GM/50ML IV SOLN
2.0000 g | Freq: Once | INTRAVENOUS | Status: AC
  Administered 2018-04-20: 2 g via INTRAVENOUS
  Filled 2018-04-20: qty 50

## 2018-04-20 NOTE — Progress Notes (Signed)
PROGRESS NOTE    Nicole [name]  VEH:[id number] DOB: Mar 03, 1931 DOA: 04/17/2018 PCP: [name], DO    Brief Narrative: Nicole [name] is a 84 y.o. female with medical history significant of with history of chronic hypoxia on 3 L nasal cannula at home, diastolic congestive heart failure, chronic atrial fibrillation, dementia, CKD stage III, diabetes mellitus type 2, essential hypertension was brought to the hospital by family for evaluation of generalized weakness and shortness of breath.  Patient was here about 3 weeks ago and treated for CHF exacerbation.  She was discharged to [facility] rehab place where she resided for about 3 weeks and went home 2 days ago.  After going home she continued to feel very weak and unable to get out of bed much, husband noted that she was also getting dyspneic.  She does not really take Lasix at home and its prescribed as needed.  Continue using 3 L nasal cannula.  Husband states patient is compliant with her medication for the once which she is prescribed.  Has not really followed up with a cardiologist. Patient is slightly confused at this time therefore history is somewhat limited from her.  Most of the history is provided by the sister and husband present at bedside.  In the ER patient was noted to have elevated blood pressure systolic greater than 836 which Nitropaste was placed on her.  She was also noted to have physical signs of fluid overload with elevated JVD and diminished breath sounds.  She also had elevated BNP.  40 mg of IV Lasix was given and medical team was requested to admit the patient.  When I saw the patient she was sitting comfortably at rest and did not have any active complaints but did admit to feeling extremely weak.   Assessment & Plan:   Principal Problem:   Acute respiratory distress Active Problems:   History of colon cancer, stage III   Diabetic nephropathy (HCC)   Hypertensive urgency   Type 2 diabetes mellitus with  peripheral neuropathy (HCC)   Essential hypertension   General weakness   GERD (gastroesophageal reflux disease)   Acute diastolic CHF (congestive heart failure), NYHA class 3 (HCC)   Acute diastolic heart failure exacerbation;  Acute respiratory distress.  Continue with IV lasix 40 mg IV TID>  ECHO with diastolic dysfunction, moderate aortic insufficiency and mild mitral regurgitation.  Cardiology consulted.  BNP 2577. Chest x ray ; bilateral pulmonary edema , pleural effusion.  Urine out put 2.6 l.  Weight 124---115---117 Plan to continue with IV lasix.   HTN urgency;  Continue with Cardizem, IV lasix, imdur, nitro paste.  Start oral hydralazine and PRN IV hydralazine.  Continue with hydralazine to 50 mg.  Blood pressure better controlled.   Generalize weakness; multifactorial, CHF, deconditioning.  PT evaluation.  More alert.   AKI on CKD stage III;  Suspect related to heart failure.  monitor on lasix. Cr peak to 1.7 this admission.  Cr trending down. Cr 1.2.   DM; Hypoglycemia. Hold insulin.   Hypothyroidism;  Continue with synthroid.   Chronc A fib;  No anticoagulation due to recent ICH.  Continue with Cardizem.  Continue with metoprolol/ metoprolol dose increase to 50 mg   History of dementia.  Continue with Aricept.   Moderate severe protein calorie malnutrition -Encourage oral diet.  History of [place] discharge 6-29; complicated by seizure.  On Vimpat for seizure.   Hypomagnesemia;  Start oral supplement. Received IV mg 9-27 Repeat IV  mag. dose  Hypokalemia ; resolved  Vitamin D deficiency and Vitamin B 12 low normal range.  Started  supplement  DVT prophylaxis: SCD.  Code Status: DNR Family Communication: husband at bedside.  Disposition Plan: remain inpatient for management of HF, HTN urgency   Consultants:   Cardiology    Procedures:   none   Antimicrobials: none   Subjective: Alert, eating breakfast. Dyspnea improved.    Objective: Vitals:   04/20/18 0531 04/20/18 0554 04/20/18 0958 04/20/18 1300  BP: 121/83  120/78 110/70  Pulse: 100  78   Resp: 18  18   Temp: 98.2 F (36.8 C)  97.6 F (36.4 C)   TempSrc: Oral  Oral   SpO2: 100%  100%   Weight:  53.1 kg    Height:        Intake/Output Summary (Last 24 hours) at 04/20/2018 1435 Last data filed at 04/20/2018 1300 Gross per 24 hour  Intake 440 ml  Output 300 ml  Net 140 ml   Filed Weights   04/18/18 0618 04/19/18 0518 04/20/18 0554  Weight: 56.6 kg 52.3 kg 53.1 kg    Examination:  General exam: Alert, follows command.   Respiratory system: normal respiratory effort, bilateral crackles.  Cardiovascular system: S 1, S 2 RRR Gastrointestinal system: BS present, soft, nt Central nervous system: Alert, follows command.  Extremities: Trace edema Skin: no rashes.     Data Reviewed: I have personally reviewed following labs and imaging studies  CBC: Recent Labs  Lab 04/17/18 1034 04/18/18 0513 04/20/18 0511  WBC 7.6 8.0 6.7  NEUTROABS 5.5  --   --   HGB 11.3* 10.4* 12.1  HCT 33.6* 30.2* 35.1*  MCV 80.4 78.9 79.6  PLT 232 211 474   Basic Metabolic Panel: Recent Labs  Lab 04/17/18 1034 04/18/18 0513 04/19/18 0740 04/20/18 0511  NA 145 145 141 139  K 4.5 3.2* 3.5 4.1  CL 107 105 98 96*  CO2 26 31 32 33*  GLUCOSE 111* 102* 92 94  BUN 26* 26* 18 18  CREATININE 1.76* 1.55* 1.16* 1.26*  CALCIUM 8.9 8.3* 8.5* 8.5*  MG  --  1.4*  --  1.4*   GFR: Estimated Creatinine Clearance: 25.8 mL/min (A) (by C-G formula based on SCr of 1.26 mg/dL (H)). Liver Function Tests: Recent Labs  Lab 04/17/18 1034 04/18/18 0513  AST 38 26  ALT 31 23  ALKPHOS 123 106  BILITOT 1.2 0.8  PROT 6.6 5.7*  ALBUMIN 2.8* 2.5*   No results for input(s): LIPASE, AMYLASE in the last 168 hours. No results for input(s): AMMONIA in the last 168 hours. Coagulation Profile: Recent Labs  Lab 04/18/18 0513  INR 1.38   Cardiac Enzymes: No results  for input(s): CKTOTAL, CKMB, CKMBINDEX, TROPONINI in the last 168 hours. BNP (last 3 results) No results for input(s): PROBNP in the last 8760 hours. HbA1C: No results for input(s): HGBA1C in the last 72 hours. CBG: Recent Labs  Lab 04/19/18 0830 04/19/18 1626 04/19/18 2050 04/20/18 0742 04/20/18 1157  GLUCAP 90 206* 135* 85 166*   Lipid Profile: No results for input(s): CHOL, HDL, LDLCALC, TRIG, CHOLHDL, LDLDIRECT in the last 72 hours. Thyroid Function Tests: No results for input(s): TSH, T4TOTAL, FREET4, T3FREE, THYROIDAB in the last 72 hours. Anemia Panel: Recent Labs    04/18/18 1024  VITAMINB12 363   Sepsis Labs: No results for input(s): PROCALCITON, LATICACIDVEN in the last 168 hours.  No results found for this or any previous  visit (from the past 240 hour(s)).       Radiology Studies: No results found.      Scheduled Meds: . diltiazem  30 mg Oral BID  . donepezil  10 mg Oral QHS  . feeding supplement (ENSURE ENLIVE)  237 mL Oral BID BM  . ferrous sulfate  325 mg Oral BID WC  . furosemide  40 mg Intravenous BID  . hydrALAZINE  50 mg Oral Q8H  . isosorbide mononitrate  30 mg Oral BID  . lacosamide  75 mg Oral BID  . levothyroxine  25 mcg Oral QAC breakfast  . magnesium oxide  200 mg Oral BID  . metoprolol succinate  50 mg Oral Daily  . senna-docusate  1 tablet Oral Daily  . sertraline  50 mg Oral Daily  . vitamin B-12  100 mcg Oral Daily  . Vitamin D (Ergocalciferol)  50,000 Units Oral Q7 days   Continuous Infusions:   LOS: 3 days    Time spent: 35 minutes.     Elmarie Shiley, MD [facility] Pager [phone]  If 7PM-7AM, please contact night-coverage [email or web address] Password [email or web address] 04/20/2018, 2:35 PM

## 2018-04-20 NOTE — Progress Notes (Signed)
Progress Note  Patient Name: Nicole [name] Date of Encounter: 04/20/2018  Primary Cardiologist: Pixie Casino, MD   Subjective   Denies dyspnea or CP  Inpatient Medications    Scheduled Meds: . diltiazem  30 mg Oral BID  . donepezil  10 mg Oral QHS  . feeding supplement (ENSURE ENLIVE)  237 mL Oral BID BM  . ferrous sulfate  325 mg Oral BID WC  . furosemide  40 mg Intravenous BID  . hydrALAZINE  50 mg Oral Q8H  . isosorbide mononitrate  30 mg Oral BID  . lacosamide  75 mg Oral BID  . levothyroxine  25 mcg Oral QAC breakfast  . magnesium oxide  200 mg Oral BID  . metoprolol succinate  50 mg Oral Daily  . senna-docusate  1 tablet Oral Daily  . sertraline  50 mg Oral Daily  . vitamin B-12  100 mcg Oral Daily  . Vitamin D (Ergocalciferol)  50,000 Units Oral Q7 days   Continuous Infusions: . magnesium sulfate 1 - 4 g bolus IVPB 2 g (04/20/18 1006)   PRN Meds: acetaminophen **OR** acetaminophen, bisacodyl, hydrALAZINE, HYDROcodone-acetaminophen, ipratropium, ondansetron **OR** ondansetron (ZOFRAN) IV   Vital Signs    Vitals:   04/19/18 1959 04/20/18 0531 04/20/18 0554 04/20/18 0958  BP: (!) 135/94 121/83  120/78  Pulse: 84 100  78  Resp: 16 18  18   Temp: 98.2 F (36.8 C) 98.2 F (36.8 C)  97.6 F (36.4 C)  TempSrc: Oral Oral  Oral  SpO2: 100% 100%  100%  Weight:   53.1 kg   Height:        Intake/Output Summary (Last 24 hours) at 04/20/2018 1011 Last data filed at 04/19/2018 2200 Gross per 24 hour  Intake 360 ml  Output -  Net 360 ml   Filed Weights   04/18/18 0618 04/19/18 0518 04/20/18 0554  Weight: 56.6 kg 52.3 kg 53.1 kg    Telemetry    Atrial fibrillation, rate controlled - Personally Reviewed   Physical Exam   GEN: WD Frail No acute distress.   Neck: supple, no JVD Cardiac: irregular, normal rate Respiratory: mildly diminished BS bases GI: Soft, NT/ND MS: No edema Neuro:  Grossly intact  Labs    Chemistry Recent Labs  Lab  04/17/18 1034 04/18/18 0513 04/19/18 0740 04/20/18 0511  NA 145 145 141 139  K 4.5 3.2* 3.5 4.1  CL 107 105 98 96*  CO2 26 31 32 33*  GLUCOSE 111* 102* 92 94  BUN 26* 26* 18 18  CREATININE 1.76* 1.55* 1.16* 1.26*  CALCIUM 8.9 8.3* 8.5* 8.5*  PROT 6.6 5.7*  --   --   ALBUMIN 2.8* 2.5*  --   --   AST 38 26  --   --   ALT 31 23  --   --   ALKPHOS 123 106  --   --   BILITOT 1.2 0.8  --   --   GFRNONAA 25* 29* 42* 38*  GFRAA 29* 34* 48* 44*  ANIONGAP 12 9 11 10      Hematology Recent Labs  Lab 04/17/18 1034 04/18/18 0513 04/20/18 0511  WBC 7.6 8.0 6.7  RBC 4.18 3.83* 4.41  HGB 11.3* 10.4* 12.1  HCT 33.6* 30.2* 35.1*  MCV 80.4 78.9 79.6  MCH 27.0 27.2 27.4  MCHC 33.6 34.4 34.5  RDW 16.4* 15.9* 16.9*  PLT 232 211 226    Recent Labs  Lab 04/17/18 1051  TROPIPOC 0.03  BNP Recent Labs  Lab 04/17/18 1034  BNP 2,577.0*      Patient Profile     84 year old female here with acute on chronic diastolic heart failure with bilateral pleural effusions, failure to thrive, dementia, unable to anticoagulate atrial fibrillation due to intracranial hemorrhage, thoracic aortic aneurysm, recent stroke here with shortness of breath consistent with acute diastolic heart failure.  Last echocardiogram August 2019 showed normal LV function, moderate aortic insufficiency, mildly dilated aortic root, mild mitral regurgitation, biatrial enlargement, mild to moderate right ventricular enlargement.  Assessment & Plan    1 acute on chronic diastolic congestive heart failure-patient improving. I/O+720 (inaccurate due to incontinence per nursing); weight 53 kg.  I will continue Lasix 40 mg IV twice daily.  Continue fluid restriction and low-sodium diet.  Follow renal function.  2 permanent atrial fibrillation-continue beta-blocker and Cardizem for rate control.  Patient had a recent intracranial hemorrhage so not a candidate for anticoagulation.  3 hypertension-blood pressure is improved  this morning.  Continue present medications and follow.  4 acute on chronic stage III kidney disease-recheck potassium and renal function tomorrow morning.  5 Dementia-per IM. Pt NCB.  For questions or updates, please contact [place] Please consult [email or web address] for contact info under     Signed, Kirk Ruths, MD  04/20/2018, 10:11 AM

## 2018-04-21 ENCOUNTER — Inpatient Hospital Stay (HOSPITAL_COMMUNITY): Payer: Medicare HMO

## 2018-04-21 LAB — BASIC METABOLIC PANEL
ANION GAP: 8 (ref 5–15)
BUN: 27 mg/dL — ABNORMAL HIGH (ref 8–23)
CALCIUM: 8.7 mg/dL — AB (ref 8.9–10.3)
CO2: 36 mmol/L — ABNORMAL HIGH (ref 22–32)
CREATININE: 1.45 mg/dL — AB (ref 0.44–1.00)
Chloride: 95 mmol/L — ABNORMAL LOW (ref 98–111)
GFR, EST AFRICAN AMERICAN: 37 mL/min — AB (ref >60)
GFR, EST NON AFRICAN AMERICAN: 32 mL/min — AB (ref >60)
GLUCOSE: 131 mg/dL — AB (ref 70–99)
Potassium: 3.7 mmol/L (ref 3.5–5.1)
Sodium: 139 mmol/L (ref 135–145)

## 2018-04-21 LAB — GLUCOSE, CAPILLARY
GLUCOSE-CAPILLARY: 147 mg/dL — AB (ref 70–99)
Glucose-Capillary: 185 mg/dL — ABNORMAL HIGH (ref 70–99)
Glucose-Capillary: 112 mg/dL — ABNORMAL HIGH (ref 70–99)
Glucose-Capillary: 138 mg/dL — ABNORMAL HIGH (ref 70–99)

## 2018-04-21 LAB — MAGNESIUM: Magnesium: 1.9 mg/dL (ref 1.7–2.4)

## 2018-04-21 MED ORDER — GLUCERNA SHAKE PO LIQD
237.0000 mL | Freq: Two times a day (BID) | ORAL | Status: DC
  Administered 2018-04-21 – 2018-04-25 (×9): 237 mL via ORAL

## 2018-04-21 MED ORDER — HYDRALAZINE HCL 25 MG PO TABS
25.0000 mg | ORAL_TABLET | Freq: Three times a day (TID) | ORAL | Status: DC
  Administered 2018-04-21 – 2018-04-25 (×10): 25 mg via ORAL
  Filled 2018-04-21 (×11): qty 1

## 2018-04-21 NOTE — Progress Notes (Addendum)
Progress Note  Patient Name: Nicole [name] Date of Encounter: 04/21/2018  Primary Cardiologist: Pixie Casino, MD   Subjective   Patient denies chest discomfort or shortness of breath but she has appears to have increased work of breathing with talking.  She is still on oxygen per nasal cannula  Inpatient Medications    Scheduled Meds: . diltiazem  30 mg Oral BID  . donepezil  10 mg Oral QHS  . feeding supplement (GLUCERNA SHAKE)  237 mL Oral BID BM  . ferrous sulfate  325 mg Oral BID WC  . furosemide  40 mg Intravenous BID  . hydrALAZINE  25 mg Oral Q8H  . isosorbide mononitrate  30 mg Oral BID  . lacosamide  75 mg Oral BID  . levothyroxine  25 mcg Oral QAC breakfast  . magnesium oxide  200 mg Oral BID  . metoprolol succinate  50 mg Oral Daily  . senna-docusate  1 tablet Oral Daily  . sertraline  50 mg Oral Daily  . vitamin B-12  100 mcg Oral Daily  . Vitamin D (Ergocalciferol)  50,000 Units Oral Q7 days   Continuous Infusions:  PRN Meds: acetaminophen **OR** acetaminophen, bisacodyl, hydrALAZINE, HYDROcodone-acetaminophen, ipratropium, ondansetron **OR** ondansetron (ZOFRAN) IV   Vital Signs    Vitals:   04/21/18 0215 04/21/18 0542 04/21/18 1024 04/21/18 1235  BP:  125/78 (!) 124/96 101/64  Pulse:  71 82 64  Resp:  18  (!) 22  Temp:  98 F (36.7 C)  98.8 F (37.1 C)  TempSrc:  Oral  Oral  SpO2:  93% 99% 100%  Weight: 54.5 kg     Height:        Intake/Output Summary (Last 24 hours) at 04/21/2018 1626 Last data filed at 04/21/2018 1557 Gross per 24 hour  Intake 600 ml  Output 1000 ml  Net -400 ml   Filed Weights   04/19/18 0518 04/20/18 0554 04/21/18 0215  Weight: 52.3 kg 53.1 kg 54.5 kg    Telemetry    Atrial fibrillation in the 70s-90s- Personally Reviewed  ECG    No new tracings- Personally Reviewed  Physical Exam   GEN: Thin, elderly female No acute distress.   Neck:  Minimal JVD Cardiac: RRR, no murmurs, rubs, or gallops.    Respiratory: Clear to auscultation bilaterally, except decreased breath sounds in the right base GI: Soft, nontender, non-distended  MS: No edema; No deformity. Neuro:  Nonfocal  Psych: Normal affect   Labs    Chemistry Recent Labs  Lab 04/17/18 1034 04/18/18 0513 04/19/18 0740 04/20/18 0511 04/21/18 0606  NA 145 145 141 139 139  K 4.5 3.2* 3.5 4.1 3.7  CL 107 105 98 96* 95*  CO2 26 31 32 33* 36*  GLUCOSE 111* 102* 92 94 131*  BUN 26* 26* 18 18 27*  CREATININE 1.76* 1.55* 1.16* 1.26* 1.45*  CALCIUM 8.9 8.3* 8.5* 8.5* 8.7*  PROT 6.6 5.7*  --   --   --   ALBUMIN 2.8* 2.5*  --   --   --   AST 38 26  --   --   --   ALT 31 23  --   --   --   ALKPHOS 123 106  --   --   --   BILITOT 1.2 0.8  --   --   --   GFRNONAA 25* 29* 42* 38* 32*  GFRAA 29* 34* 48* 44* 37*  ANIONGAP 12 9 11 10  8  Hematology Recent Labs  Lab 04/17/18 1034 04/18/18 0513 04/20/18 0511  WBC 7.6 8.0 6.7  RBC 4.18 3.83* 4.41  HGB 11.3* 10.4* 12.1  HCT 33.6* 30.2* 35.1*  MCV 80.4 78.9 79.6  MCH 27.0 27.2 27.4  MCHC 33.6 34.4 34.5  RDW 16.4* 15.9* 16.9*  PLT 232 211 226    Cardiac EnzymesNo results for input(s): TROPONINI in the last 168 hours.  Recent Labs  Lab 04/17/18 1051  TROPIPOC 0.03     BNP Recent Labs  Lab 04/17/18 1034  BNP 2,577.0*     DDimer No results for input(s): DDIMER in the last 168 hours.   Radiology    Dg Chest 2 View  Result Date: 04/21/2018 CLINICAL DATA:  Shortness of breath EXAM: CHEST - 2 VIEW COMPARISON:  04/17/2018 FINDINGS: The patient is rotated to the right on today's radiograph, reducing diagnostic sensitivity and specificity. Moderate enlargement of the cardiopericardial silhouette, similar to prior. Atherosclerotic calcification of the aortic arch. Continued airspace opacity obscuring the right hemidiaphragm, primarily in the right lower lobe, with some volume loss and likely underlying moderate to large right pleural effusion. Mildly improved  aeration at the left lung base. Blunted right and possibly left costophrenic angles. Prominent main pulmonary artery contour. Degenerative glenohumeral arthropathy bilaterally. IMPRESSION: 1. Roughly similar moderate to large right pleural effusion with passive atelectasis. Small left pleural effusion. Improved aeration at the left lung base. 2. Moderate cardiomegaly. 3.  Aortic Atherosclerosis (ICD10-I70.0). 4. Enlarged pulmonary artery, potentially reflecting pulmonary arterial hypertension. Electronically Signed   By: Van Clines M.D.   On: 04/21/2018 11:47    Cardiac Studies   Echocardiogram 03/18/2018 Study Conclusions - Left ventricle: The cavity size was normal. Wall thickness was   increased in a pattern of moderate LVH. Systolic function was   normal. The estimated ejection fraction was in the range of 60%   to 65%. Wall motion was normal; there were no regional wall   motion abnormalities. The study was not technically sufficient to   allow evaluation of LV diastolic dysfunction due to atrial   fibrillation. - Aortic valve: Trileaflet; mildly calcified leaflets. There was no   stenosis. There was moderate regurgitation. - Aorta: Mildly dilated aortic root and ascending aorta. Ascending   aorta dimension: 40 mm. Aortic root dimension: 37 mm (ED). - Mitral valve: Mildly calcified annulus. Mildly calcified leaflets   . There was mild regurgitation. - Left atrium: The atrium was moderately dilated. - Right ventricle: The cavity size was mildly to moderately   dilated. Systolic function was normal. - Right atrium: The atrium was severely dilated. - Tricuspid valve: Peak RV-RA gradient (S): 54 mm Hg. - Pulmonary arteries: PA peak pressure: 62 mm Hg (S). - Systemic veins: IVC measured 1.9 cm with < 50% respirophasic   variation, suggesting RA pressure 8 mmHg.  Impressions: - The patient was in atrial fibrillation. Normal LV size with   moderate LV hypertrophy. EF 60-65%. Mild  to moderate RV dilation   with normal systolic function. Mild MR, moderate aortic   insufficiency. Moderate pulmonary hypertension. Biatrial   enlargement.  Patient Profile     84 y.o. female here with acute on chronic diastolic heart failure with bilateral pleural effusions, failure to thrive, dementia, unable to anticoagulate atrial fibrillation due to intracranial hemorrhage, thoracic aortic aneurysm, recent stroke here with shortness of breath consistent with acute diastolic heart failure.  Last echocardiogram August 2019 showed normal LV function, moderate aortic insufficiency, mildly dilated aortic root, mild  mitral regurgitation, biatrial enlargement, mild to moderate right ventricular enlargement.  Assessment & Plan    Acute on chronic diastolic CHF -Diuresing with Lasix 40 mg IV twice daily -She is having only modest diuresis with 850 mL of urine output yesterday -Weight 124>115>117>120 -On fluid restriction and low-sodium diet -Serum creatinine was 1.76 on admission drifted down to 1.16 and now back up to 1.45 -Chest x-ray done today showed continued moderate to large right pleural effusion.  Agree with primary MD to attempt to drain with thoracentesis as it does not seem to be decreasing with diuretic  Permanent atrial fibrillation -Rate controlled on diltiazem and Toprol -Recent intracranial hemorrhage prohibits use of anticoagulation  Hypertension -BP had been up but is currently well controlled to a little soft at times  Acute on chronic CKD stage III -Serum creatinine was 1.76 on admission drifted down to 1.16 and now back up to 1.45  Dementia -On Aricept.  Management per primary team      For questions or updates, please contact [place] Please consult [email or web address] for contact info under        Signed, Daune Perch, NP  04/21/2018, 4:26 PM   ----------------------------------------------------------------- History and all data above reviewed.   Patient examined.  I agree with the findings as above. All available labs, radiology testing, previous records reviewed. Agree with documented assessment and plan.  Feeling better but planning on thoracentesis tomorrow if indicated.  Gen: Thin, elderly female No acute distress.   Neck:  Minimal JVD Cardiac: RRR, no murmurs, rubs, or gallops.  Respiratory: Clear to auscultation bilaterally, except decreased breath sounds in the right base GI: Soft, nontender, non-distended  MS: No edema; No deformity. Neuro:  Nonfocal  Psych: Normal affect   She continues to be short of breath with talking, and states that she feels better than presentation but has SOB with exertion. She is visibly short of breath lying in bed. It would be reasonable to consider thoracentesis, especially since she has had adequate diuresis, and possibly may need to deescalate diuretic therapy for a short period of time if renal function declines. We will reevaluate afterward to see how much more diuresis is needed. We can likely decrease diuretic dose to 20 mg BID, but again we can reevaluate the effect of draining pleural effusion.   Elouise Munroe  6:53 PM  04/21/2018

## 2018-04-21 NOTE — Progress Notes (Signed)
PT Cancellation Note  Patient Details Name: Nicole [name] MRN: [id number] DOB: [birth year]   Cancelled Treatment:    Reason Eval/Treat Not Completed: Patient at procedure or test/unavailable Attempted to work with patient however she was not in her room and was unavailable to work with therapy. Will attempt to return as/if time and schedule allow.    Deniece Ree PT, DPT, CBIS  Supplemental Physical Therapist [facility]    Pager [phone] Acute Rehab Office [phone]

## 2018-04-21 NOTE — Progress Notes (Signed)
Clinical Social Worker following patient/family for support and discharge needs. CSW spoke with [name] via phone. Peter Congo aware that patient was recently at a SNF and if patient returns she will be in her copay days. Peter Congo stated that patient will not be able to afford the copay's therefor she would like patient to discharge home with home health to follow. Peter Congo stated that patient receives assistance from an aide 5 hours a day and stated she will reach out to the aide and increase the hours patient will have her in the home. CSW signing off as social work needs have been met.   Rhea Pink, MSW,  [place]

## 2018-04-21 NOTE — Progress Notes (Signed)
Initial Nutrition Assessment  DOCUMENTATION CODES:   Severe malnutrition in context of chronic illness  INTERVENTION:   Glucerna Shake po BID, each supplement provides 220 kcal and 10 grams of protein  NUTRITION DIAGNOSIS:   Severe Malnutrition related to chronic illness(dementia/CHF) as evidenced by moderate fat depletion, severe muscle depletion, percent weight loss, energy intake < or equal to 75% for > or equal to 1 month.  GOAL:   Patient will meet greater than or equal to 90% of their needs  MONITOR:   PO intake, Supplement acceptance, Labs, Weight trends, I & O's  REASON FOR ASSESSMENT:   Consult Assessment of nutrition requirement/status  ASSESSMENT:   Patient with PMH significant for chronic hypoxia on 3 L nasal cannula at home, diastolic CHF, chronic atrial fibrillation, dementia, CKD stage III, DM, CVA, essential HTN. Presents this admission with complaints of generalized weakness and shortness of breath. Admitted for acute exacerbation of CHF and mild AKI.    Per daughter pt lives with a friend at home who does most of the cooking. The pt typically eats three meals a day that consist of breakfast- eggs, bacon, oatmeal, toast, lunch- Glucerna, and for dinner- a small amount of meat, vegetable, and grain. The pt's friend reports pt's intake is off/on depending on what she can remember. Pt recently stopped drinking her Glucerna over the last few weeks. Pt is currently on a heart healthy diet with inconsistent meal completions charted (0-100%). Pt is currently ordered Ensure BID. Daughter would like for this to be changed to Glucerna..   Daughter endorses pt's UBW stays around 147 lb. Records indicate pt weighed 131 lb on 10/16/17 and 115 lb this admission (12.2% wt loss in 6 months, significant for time frame). Nutrition-Focused physical exam completed.   Medications reviewed and include: ferrous sulfate, 40 mg lasix BID, Mag-Ox, Vit B12, Vit D Labs reviewed: CBG  102-206  NUTRITION - FOCUSED PHYSICAL EXAM:    Most Recent Value  Orbital Region  Mild depletion  Upper Arm Region  Severe depletion  Thoracic and Lumbar Region  Moderate depletion  Buccal Region  Moderate depletion  Temple Region  Severe depletion  Clavicle Bone Region  Severe depletion  Clavicle and Acromion Bone Region  Severe depletion  Scapular Bone Region  Moderate depletion  Dorsal Hand  Moderate depletion  Patellar Region  Severe depletion  Anterior Thigh Region  Severe depletion  Posterior Calf Region  Severe depletion  Edema (RD Assessment)  Mild     Diet Order:   Diet Order            Diet Heart Room service appropriate? Yes; Fluid consistency: Thin; Fluid restriction: 1500 mL Fluid  Diet effective now              EDUCATION NEEDS:   Education needs have been addressed  Skin:  Skin Assessment: Reviewed RN Assessment  Last BM:  PTA  Height:   Ht Readings from Last 1 Encounters:  04/17/18 5\' 2"  (1.575 m)    Weight:   Wt Readings from Last 1 Encounters:  04/21/18 54.5 kg    Ideal Body Weight:  50 kg  BMI:  Body mass index is 21.98 kg/m.  Estimated Nutritional Needs:   Kcal:  1600-1800 kcal  Protein:  80-95 grams  Fluid:  1500 ml fluid restriction  Mariana Single RD, LDN Clinical Nutrition Pager # - [phone]

## 2018-04-21 NOTE — Progress Notes (Signed)
PROGRESS NOTE    Nicole [name]  CBJ:[id number] DOB: 08-01-1930 DOA: 04/17/2018 PCP: [name], DO    Brief Narrative: Nicole [name] is a 84 y.o. female with medical history significant of with history of chronic hypoxia on 3 L nasal cannula at home, diastolic congestive heart failure, chronic atrial fibrillation, dementia, CKD stage III, diabetes mellitus type 2, essential hypertension was brought to the hospital by family for evaluation of generalized weakness and shortness of breath.  Patient was here about 3 weeks ago and treated for CHF exacerbation.  She was discharged to [facility] rehab place where she resided for about 3 weeks and went home 2 days ago.  After going home she continued to feel very weak and unable to get out of bed much, husband noted that she was also getting dyspneic.  She does not really take Lasix at home and its prescribed as needed.  Continue using 3 L nasal cannula.  Husband states patient is compliant with her medication for the once which she is prescribed.  Has not really followed up with a cardiologist. Patient is slightly confused at this time therefore history is somewhat limited from her.  Most of the history is provided by the sister and husband present at bedside.  In the ER patient was noted to have elevated blood pressure systolic greater than 160 which Nitropaste was placed on her.  She was also noted to have physical signs of fluid overload with elevated JVD and diminished breath sounds.  She also had elevated BNP.  40 mg of IV Lasix was given and medical team was requested to admit the patient.  When I saw the patient she was sitting comfortably at rest and did not have any active complaints but did admit to feeling extremely weak.   Assessment & Plan:   Principal Problem:   Acute respiratory distress Active Problems:   History of colon cancer, stage III   Diabetic nephropathy (HCC)   Hypertensive urgency   Type 2 diabetes mellitus with  peripheral neuropathy (HCC)   Essential hypertension   General weakness   GERD (gastroesophageal reflux disease)   Acute diastolic CHF (congestive heart failure), NYHA class 3 (HCC)   Acute diastolic heart failure exacerbation;  Acute respiratory distress.  Continue with IV lasix 40 mg IV TID>  ECHO with diastolic dysfunction, moderate aortic insufficiency and mild mitral regurgitation.  Cardiology consulted.  BNP 2577. Chest x ray ; bilateral pulmonary edema , pleural effusion.  Negative 2 L.  Weight 124---115---117---120 Plan to continue with IV lasix. Cr up today. Chest x ray with persistent right side pleural effusion. Will discussed with cardio next step. Thoracentesis vs continuation of IV lasix. Prognosis ?   HTN urgency;  Continue with Cardizem, IV lasix, imdur, nitro paste.  Start oral hydralazine and PRN IV hydralazine.  BP low side, will decrease hydralazine to 25 mg TID>   Generalize weakness; multifactorial, CHF, deconditioning.  PT evaluation.  More alert.   AKI on CKD stage III;  Suspect related to heart failure.  monitor on lasix. Cr peak to 1.7 this admission.  Cr trending down. Cr 1.2.   DM; Hypoglycemia. Hold insulin.   Hypothyroidism;  Continue with synthroid.   Chronc A fib;  No anticoagulation due to recent ICH.  Continue with Cardizem.  Continue with metoprolol/ metoprolol dose increase to 50 mg   History of dementia.  Continue with Aricept.   Moderate severe protein calorie malnutrition -Encourage oral diet.  History of ICH  discharge 6-23; complicated by seizure.  On Vimpat for seizure.   Hypomagnesemia;  Start oral supplement. Received IV mg 9-27 Replaced.   Hypokalemia ; resolved  Vitamin D deficiency and Vitamin B 12 low normal range.  Started  supplement  DVT prophylaxis: SCD.  Code Status: DNR Family Communication: husband at bedside.  Disposition Plan: remain inpatient for management of HF, HTN urgency   Consultants:    Cardiology    Procedures:   none   Antimicrobials: none   Subjective: She is more alert, dyspnea improved.  denies pain   Objective: Vitals:   04/21/18 0215 04/21/18 0542 04/21/18 1024 04/21/18 1235  BP:  125/78 (!) 124/96 101/64  Pulse:  71 82 64  Resp:  18  (!) 22  Temp:  98 F (36.7 C)  98.8 F (37.1 C)  TempSrc:  Oral  Oral  SpO2:  93% 99% 100%  Weight: 54.5 kg     Height:        Intake/Output Summary (Last 24 hours) at 04/21/2018 1451 Last data filed at 04/21/2018 0528 Gross per 24 hour  Intake 600 ml  Output 550 ml  Net 50 ml   Filed Weights   04/19/18 0518 04/20/18 0554 04/21/18 0215  Weight: 52.3 kg 53.1 kg 54.5 kg    Examination:  General exam: NAD Respiratory system: normal respiratory effort, crackles bases.  Cardiovascular system: S 1, S 2 RRR Gastrointestinal system: BS present, soft nt, nd Central nervous system:  Alert, follows command.  Extremities: Trace edema Skin: no rashes.     Data Reviewed: I have personally reviewed following labs and imaging studies  CBC: Recent Labs  Lab 04/17/18 1034 04/18/18 0513 04/20/18 0511  WBC 7.6 8.0 6.7  NEUTROABS 5.5  --   --   HGB 11.3* 10.4* 12.1  HCT 33.6* 30.2* 35.1*  MCV 80.4 78.9 79.6  PLT 232 211 762   Basic Metabolic Panel: Recent Labs  Lab 04/17/18 1034 04/18/18 0513 04/19/18 0740 04/20/18 0511 04/21/18 0606  NA 145 145 141 139 139  K 4.5 3.2* 3.5 4.1 3.7  CL 107 105 98 96* 95*  CO2 26 31 32 33* 36*  GLUCOSE 111* 102* 92 94 131*  BUN 26* 26* 18 18 27*  CREATININE 1.76* 1.55* 1.16* 1.26* 1.45*  CALCIUM 8.9 8.3* 8.5* 8.5* 8.7*  MG  --  1.4*  --  1.4* 1.9   GFR: Estimated Creatinine Clearance: 22.4 mL/min (A) (by C-G formula based on SCr of 1.45 mg/dL (H)). Liver Function Tests: Recent Labs  Lab 04/17/18 1034 04/18/18 0513  AST 38 26  ALT 31 23  ALKPHOS 123 106  BILITOT 1.2 0.8  PROT 6.6 5.7*  ALBUMIN 2.8* 2.5*   No results for input(s): LIPASE, AMYLASE in  the last 168 hours. No results for input(s): AMMONIA in the last 168 hours. Coagulation Profile: Recent Labs  Lab 04/18/18 0513  INR 1.38   Cardiac Enzymes: No results for input(s): CKTOTAL, CKMB, CKMBINDEX, TROPONINI in the last 168 hours. BNP (last 3 results) No results for input(s): PROBNP in the last 8760 hours. HbA1C: No results for input(s): HGBA1C in the last 72 hours. CBG: Recent Labs  Lab 04/20/18 1157 04/20/18 1715 04/20/18 2132 04/21/18 0743 04/21/18 1234  GLUCAP 166* 102* 112* 185* 112*   Lipid Profile: No results for input(s): CHOL, HDL, LDLCALC, TRIG, CHOLHDL, LDLDIRECT in the last 72 hours. Thyroid Function Tests: No results for input(s): TSH, T4TOTAL, FREET4, T3FREE, THYROIDAB in the last 72 hours.  Anemia Panel: No results for input(s): VITAMINB12, FOLATE, FERRITIN, TIBC, IRON, RETICCTPCT in the last 72 hours. Sepsis Labs: No results for input(s): PROCALCITON, LATICACIDVEN in the last 168 hours.  No results found for this or any previous visit (from the past 240 hour(s)).       Radiology Studies: Dg Chest 2 View  Result Date: 04/21/2018 CLINICAL DATA:  Shortness of breath EXAM: CHEST - 2 VIEW COMPARISON:  04/17/2018 FINDINGS: The patient is rotated to the right on today's radiograph, reducing diagnostic sensitivity and specificity. Moderate enlargement of the cardiopericardial silhouette, similar to prior. Atherosclerotic calcification of the aortic arch. Continued airspace opacity obscuring the right hemidiaphragm, primarily in the right lower lobe, with some volume loss and likely underlying moderate to large right pleural effusion. Mildly improved aeration at the left lung base. Blunted right and possibly left costophrenic angles. Prominent main pulmonary artery contour. Degenerative glenohumeral arthropathy bilaterally. IMPRESSION: 1. Roughly similar moderate to large right pleural effusion with passive atelectasis. Small left pleural effusion. Improved  aeration at the left lung base. 2. Moderate cardiomegaly. 3.  Aortic Atherosclerosis (ICD10-I70.0). 4. Enlarged pulmonary artery, potentially reflecting pulmonary arterial hypertension. Electronically Signed   By: Van Clines M.D.   On: 04/21/2018 11:47        Scheduled Meds: . diltiazem  30 mg Oral BID  . donepezil  10 mg Oral QHS  . feeding supplement (ENSURE ENLIVE)  237 mL Oral BID BM  . ferrous sulfate  325 mg Oral BID WC  . furosemide  40 mg Intravenous BID  . hydrALAZINE  50 mg Oral Q8H  . isosorbide mononitrate  30 mg Oral BID  . lacosamide  75 mg Oral BID  . levothyroxine  25 mcg Oral QAC breakfast  . magnesium oxide  200 mg Oral BID  . metoprolol succinate  50 mg Oral Daily  . senna-docusate  1 tablet Oral Daily  . sertraline  50 mg Oral Daily  . vitamin B-12  100 mcg Oral Daily  . Vitamin D (Ergocalciferol)  50,000 Units Oral Q7 days   Continuous Infusions:   LOS: 4 days    Time spent: 35 minutes.     Elmarie Shiley, MD [facility] Pager [phone]  If 7PM-7AM, please contact night-coverage [email or web address] Password [email or web address] 04/21/2018, 2:51 PM

## 2018-04-22 ENCOUNTER — Inpatient Hospital Stay (HOSPITAL_COMMUNITY): Payer: Medicare HMO

## 2018-04-22 ENCOUNTER — Encounter (HOSPITAL_COMMUNITY): Payer: Self-pay | Admitting: Radiology

## 2018-04-22 DIAGNOSIS — Z7189 Other specified counseling: Secondary | ICD-10-CM

## 2018-04-22 DIAGNOSIS — Z515 Encounter for palliative care: Secondary | ICD-10-CM

## 2018-04-22 HISTORY — PX: IR THORACENTESIS ASP PLEURAL SPACE W/IMG GUIDE: IMG5380

## 2018-04-22 LAB — BASIC METABOLIC PANEL
Anion gap: 5 (ref 5–15)
BUN: 28 mg/dL — ABNORMAL HIGH (ref 8–23)
CHLORIDE: 96 mmol/L — AB (ref 98–111)
CO2: 36 mmol/L — AB (ref 22–32)
CREATININE: 1.45 mg/dL — AB (ref 0.44–1.00)
Calcium: 8.3 mg/dL — ABNORMAL LOW (ref 8.9–10.3)
GFR calc non Af Amer: 32 mL/min — ABNORMAL LOW (ref >60)
GFR, EST AFRICAN AMERICAN: 37 mL/min — AB (ref >60)
GLUCOSE: 84 mg/dL (ref 70–99)
Potassium: 3.7 mmol/L (ref 3.5–5.1)
Sodium: 137 mmol/L (ref 135–145)

## 2018-04-22 LAB — CBC
HCT: 33.7 % — ABNORMAL LOW (ref 36.0–46.0)
HEMOGLOBIN: 11.5 g/dL — AB (ref 12.0–15.0)
MCH: 27.1 pg (ref 26.0–34.0)
MCHC: 34.1 g/dL (ref 30.0–36.0)
MCV: 79.3 fL (ref 78.0–100.0)
Platelets: 217 10*3/uL (ref 150–400)
RBC: 4.25 MIL/uL (ref 3.87–5.11)
RDW: 16.6 % — ABNORMAL HIGH (ref 11.5–15.5)
WBC: 6.3 10*3/uL (ref 4.0–10.5)

## 2018-04-22 LAB — GRAM STAIN

## 2018-04-22 LAB — BODY FLUID CELL COUNT WITH DIFFERENTIAL
Eos, Fluid: 0 %
Lymphs, Fluid: 53 %
Monocyte-Macrophage-Serous Fluid: 43 % — ABNORMAL LOW (ref 50–90)
NEUTROPHIL FLUID: 4 % (ref 0–25)
Total Nucleated Cell Count, Fluid: 579 cu mm (ref 0–1000)

## 2018-04-22 LAB — LACTATE DEHYDROGENASE, PLEURAL OR PERITONEAL FLUID: LD, Fluid: 61 U/L — ABNORMAL HIGH (ref 3–23)

## 2018-04-22 LAB — GLUCOSE, CAPILLARY
GLUCOSE-CAPILLARY: 103 mg/dL — AB (ref 70–99)
Glucose-Capillary: 82 mg/dL (ref 70–99)
Glucose-Capillary: 220 mg/dL — ABNORMAL HIGH (ref 70–99)
Glucose-Capillary: 89 mg/dL (ref 70–99)

## 2018-04-22 MED ORDER — LIDOCAINE HCL (PF) 2 % IJ SOLN
INTRAMUSCULAR | Status: AC
  Filled 2018-04-22: qty 20

## 2018-04-22 NOTE — Progress Notes (Addendum)
Progress Note  Patient Name: Nicole [name] Date of Encounter: 04/22/2018  Primary Cardiologist: Pixie Casino, MD  Subjective   Pt up to chair today. Denies SOB at rest. States that she is feeling better.   Inpatient Medications    Scheduled Meds: . diltiazem  30 mg Oral BID  . donepezil  10 mg Oral QHS  . feeding supplement (GLUCERNA SHAKE)  237 mL Oral BID BM  . ferrous sulfate  325 mg Oral BID WC  . furosemide  40 mg Intravenous BID  . hydrALAZINE  25 mg Oral Q8H  . isosorbide mononitrate  30 mg Oral BID  . lacosamide  75 mg Oral BID  . levothyroxine  25 mcg Oral QAC breakfast  . lidocaine      . magnesium oxide  200 mg Oral BID  . metoprolol succinate  50 mg Oral Daily  . senna-docusate  1 tablet Oral Daily  . sertraline  50 mg Oral Daily  . vitamin B-12  100 mcg Oral Daily  . Vitamin D (Ergocalciferol)  50,000 Units Oral Q7 days   Continuous Infusions:  PRN Meds: acetaminophen **OR** acetaminophen, bisacodyl, hydrALAZINE, HYDROcodone-acetaminophen, ipratropium, ondansetron **OR** ondansetron (ZOFRAN) IV   Vital Signs    Vitals:   04/21/18 2200 04/22/18 0439 04/22/18 1132 04/22/18 1133  BP: 121/68 129/66    Pulse: 80 61 86   Resp:  18    Temp:  98.2 F (36.8 C)    TempSrc:  Oral    SpO2:  98%  94%  Weight:  54.6 kg    Height:        Intake/Output Summary (Last 24 hours) at 04/22/2018 1315 Last data filed at 04/22/2018 1007 Gross per 24 hour  Intake 675 ml  Output 1050 ml  Net -375 ml   Filed Weights   04/20/18 0554 04/21/18 0215 04/22/18 0439  Weight: 53.1 kg 54.5 kg 54.6 kg    Physical Exam   General: Frail, elderly, NAD Skin: Warm, dry, intact  Head: Normocephalic, atraumatic, clear, moist mucus membranes. Neck: Negative for carotid bruits. No JVD Lungs: Diminished in bilateral bases. No wheezes, rales, or rhonchi. Breathing is unlabored. Cardiovascular: Irregularly irregular with S1 S2. + murmur, No rubs, gallops, or LV heave  appreciated. Abdomen: Soft, non-tender, non-distended with normoactive bowel sounds. No obvious abdominal masses. MSK: Strength and tone appear normal for age. 5/5 in all extremities Extremities: No edema. No clubbing or cyanosis. DP/PT pulses 2+ bilaterally Neuro: Alert and oriented. No focal deficits. No facial asymmetry. MAE spontaneously. Psych: Responds to questions appropriately with normal affect.    Labs    Chemistry Recent Labs  Lab 04/17/18 1034 04/18/18 0513  04/20/18 0511 04/21/18 0606 04/22/18 0752  NA 145 145   < > 139 139 137  K 4.5 3.2*   < > 4.1 3.7 3.7  CL 107 105   < > 96* 95* 96*  CO2 26 31   < > 33* 36* 36*  GLUCOSE 111* 102*   < > 94 131* 84  BUN 26* 26*   < > 18 27* 28*  CREATININE 1.76* 1.55*   < > 1.26* 1.45* 1.45*  CALCIUM 8.9 8.3*   < > 8.5* 8.7* 8.3*  PROT 6.6 5.7*  --   --   --   --   ALBUMIN 2.8* 2.5*  --   --   --   --   AST 38 26  --   --   --   --  ALT 31 23  --   --   --   --   ALKPHOS 123 106  --   --   --   --   BILITOT 1.2 0.8  --   --   --   --   GFRNONAA 25* 29*   < > 38* 32* 32*  GFRAA 29* 34*   < > 44* 37* 37*  ANIONGAP 12 9   < > 10 8 5    < > = values in this interval not displayed.     Hematology Recent Labs  Lab 04/18/18 0513 04/20/18 0511 04/22/18 0752  WBC 8.0 6.7 6.3  RBC 3.83* 4.41 4.25  HGB 10.4* 12.1 11.5*  HCT 30.2* 35.1* 33.7*  MCV 78.9 79.6 79.3  MCH 27.2 27.4 27.1  MCHC 34.4 34.5 34.1  RDW 15.9* 16.9* 16.6*  PLT 211 226 217    Cardiac EnzymesNo results for input(s): TROPONINI in the last 168 hours.  Recent Labs  Lab 04/17/18 1051  TROPIPOC 0.03     BNP Recent Labs  Lab 04/17/18 1034  BNP 2,577.0*    DDimer No results for input(s): DDIMER in the last 168 hours.   Radiology    Dg Chest 2 View  Result Date: 04/21/2018 CLINICAL DATA:  Shortness of breath EXAM: CHEST - 2 VIEW COMPARISON:  04/17/2018 FINDINGS: The patient is rotated to the right on today's radiograph, reducing diagnostic  sensitivity and specificity. Moderate enlargement of the cardiopericardial silhouette, similar to prior. Atherosclerotic calcification of the aortic arch. Continued airspace opacity obscuring the right hemidiaphragm, primarily in the right lower lobe, with some volume loss and likely underlying moderate to large right pleural effusion. Mildly improved aeration at the left lung base. Blunted right and possibly left costophrenic angles. Prominent main pulmonary artery contour. Degenerative glenohumeral arthropathy bilaterally. IMPRESSION: 1. Roughly similar moderate to large right pleural effusion with passive atelectasis. Small left pleural effusion. Improved aeration at the left lung base. 2. Moderate cardiomegaly. 3.  Aortic Atherosclerosis (ICD10-I70.0). 4. Enlarged pulmonary artery, potentially reflecting pulmonary arterial hypertension. Electronically Signed   By: Van Clines M.D.   On: 04/21/2018 11:47   Telemetry    04/22/18 Atrial fibrillation HR 60-80's  - Personally Reviewed  ECG    04/21/18 Atrial fibrillation HR 92 - Personally Reviewed  Cardiac Studies   Echocardiogram 03/18/2018 Study Conclusions - Left ventricle: The cavity size was normal. Wall thickness was increased in a pattern of moderate LVH. Systolic function was normal. The estimated ejection fraction was in the range of 60% to 65%. Wall motion was normal; there were no regional wall motion abnormalities. The study was not technically sufficient to allow evaluation of LV diastolic dysfunction due to atrial fibrillation. - Aortic valve: Trileaflet; mildly calcified leaflets. There was no stenosis. There was moderate regurgitation. - Aorta: Mildly dilated aortic root and ascending aorta. Ascending aorta dimension: 40 mm. Aortic root dimension: 37 mm (ED). - Mitral valve: Mildly calcified annulus. Mildly calcified leaflets . There was mild regurgitation. - Left atrium: The atrium was moderately  dilated. - Right ventricle: The cavity size was mildly to moderately dilated. Systolic function was normal. - Right atrium: The atrium was severely dilated. - Tricuspid valve: Peak RV-RA gradient (S): 54 mm Hg. - Pulmonary arteries: PA peak pressure: 62 mm Hg (S). - Systemic veins: IVC measured 1.9 cm with < 50% respirophasic variation, suggesting RA pressure 8 mmHg.  Impressions: - The patient was in atrial fibrillation. Normal LV size with moderate LV  hypertrophy. EF 60-65%. Mild to moderate RV dilation with normal systolic function. Mild MR, moderate aortic insufficiency. Moderate pulmonary hypertension. Biatrial enlargement.  Patient Profile     84 y.o. female admitted with acute on chronic diastolic heart failure with bilateral pleural effusions, failure to thrive, dementia, unable to anticoagulate atrial fibrillation due to intracranial hemorrhage, thoracic aortic aneurysm, recent stroke here with shortness of breath consistent with acute diastolic heart failure. Last echocardiogram August 2019 showed normal LV function, moderate aortic insufficiency, mildly dilated aortic root, mild mitral regurgitation, biatrial enlargement, mild to moderate right ventricular enlargement.  Assessment & Plan    1. Acute on chronic diastolic CHF: -Diuresising with IV Lasix 40mg  BID>>consider decreasing to 20mg  per note from 04/21/18>>unclear per primary team note if we will persue thorocentesis versus continuation of Lasix  -Weight, 120lb today, 124lb on admission  -I&O, net negative 2.4L since admission  -Continue current regimen for now and will discuss with MD   2. Permanent atrial fibrillation: -Rate controlled in the 60-80's  -Continue diltiazem and Toprol for rate control   3. Hypertension: -Stable, 129/66>121/68>117/64 -Continue diltiazem 30, hydralazine 25, Imdur 30, Toprol XL    4. Acute on chronic CKD stage III: -Creatinine, 1.45 today  -Baseline appears to be in  the 1.2-1.4 -Continue to monitor closely with daily BMET and avoid nephrotoxic medications   5. Dementia: -Continue Aricept -Per primary team   Signed, Kathyrn Drown NP-C [facility] Pager: [phone] 04/22/2018, 1:15 PM    --------------------------------------------------------------------------------------   History and all data above reviewed.  Patient examined.  I agree with the findings as above.  [name] feels slightly better after thoracentesis. She has a small pneumothorax as a result. This will need to be followed.   Gen: Thin, elderly femaleNo acute distress.   Neck: Minimal JVD Cardiac:RRR, no murmurs, rubs, or gallops.  Respiratory:bilbasilar decreased breath sounds UV:OZDG, nontender, non-distended  MS:No edema; No deformity. Neuro:Nonfocal  Psych: Normal affect   All available labs, radiology testing, previous records reviewed. Agree with documented assessment and plan of my colleague as stated above with the following additions or changes:  Principal Problem:   Acute respiratory distress Active Problems:   History of colon cancer, stage III   Diabetic nephropathy (HCC)   Hypertensive urgency   Type 2 diabetes mellitus with peripheral neuropathy (HCC)   Essential hypertension   General weakness   GERD (gastroesophageal reflux disease)   Acute diastolic CHF (congestive heart failure), NYHA class 3 ([place])   Plan: She is s/p thoracentesis and adequate diuresis. - Consider decreasing dosage of lasix, and converting to oral for ease of dismissal. Would recommend 40 mg po BID lasix, with a creatinine check within 3 days of dismissal from hospital.  No other changes to cardiovascular therapy needed at this time.  We will reevaluate in the morning s/p thoracentesis today, and likely sign off tomorrow. She should follow up with cardiology in 1-2 weeks post hospital dismissal.   Elouise Munroe, MD [facility] 9:46 PM  04/22/2018   For  questions or updates, please contact   Please consult [email or web address] for contact info under Cardiology/STEMI.

## 2018-04-22 NOTE — Progress Notes (Addendum)
PROGRESS NOTE    Nicole [name]  GDJ:[id number] DOB: [birth year] DOA: 04/17/2018 PCP: [name], DO    Brief Narrative: Nicole [name] is a 84 y.o. female with medical history significant of with history of chronic hypoxia on 3 L nasal cannula at home, diastolic congestive heart failure, chronic atrial fibrillation, dementia, CKD stage III, diabetes mellitus type 2, essential hypertension was brought to the hospital by family for evaluation of generalized weakness and shortness of breath.  Patient was here about 3 weeks ago and treated for CHF exacerbation.  She was discharged to [facility] rehab place where she resided for about 3 weeks and went home 2 days ago.  After going home she continued to feel very weak and unable to get out of bed much, husband noted that she was also getting dyspneic.  She does not really take Lasix at home and its prescribed as needed.  Continue using 3 L nasal cannula.  Husband states patient is compliant with her medication for the once which she is prescribed.  Has not really followed up with a cardiologist. Patient is slightly confused at this time therefore history is somewhat limited from her.  Most of the history is provided by the sister and husband present at bedside.  In the ER patient was noted to have elevated blood pressure systolic greater than 622 which Nitropaste was placed on her.  She was also noted to have physical signs of fluid overload with elevated JVD and diminished breath sounds.  She also had elevated BNP.  40 mg of IV Lasix was given and medical team was requested to admit the patient.  Patient admitted with recurrent CHF exacerbation, she was started on IV lasix. Cardiology consulted and helping with management. Patient is not candidate for anticoagulation due to recent intra-craneal bleed. Right side pleural effusion persist, will ask IR for thoracentesis. I have consulted palliative care for goals of care.    Assessment & Plan:     Principal Problem:   Acute respiratory distress Active Problems:   History of colon cancer, stage III   Diabetic nephropathy (HCC)   Hypertensive urgency   Type 2 diabetes mellitus with peripheral neuropathy (HCC)   Essential hypertension   General weakness   GERD (gastroesophageal reflux disease)   Acute diastolic CHF (congestive heart failure), NYHA class 3 (HCC)   Acute diastolic heart failure exacerbation;  Acute respiratory distress.  Treated  IV lasix 40 mg IV TID>  ECHO with diastolic dysfunction, moderate aortic insufficiency and mild mitral regurgitation.  Cardiology consulted.  BNP 2577. Chest x ray ; bilateral pulmonary edema , pleural effusion.  Negative 2 L.  Weight 124---115---117---120--120 Family agree to proceed with thoracentesis. IR consulted.  Palliative care also consulted.   HTN urgency;  Continue with Cardizem, IV lasix, imdur, nitro paste.  Start oral hydralazine and PRN IV hydralazine.  BP low side, will decrease hydralazine to 25 mg TID>   Generalize weakness; multifactorial, CHF, deconditioning.  PT evaluation.  More alert.   AKI on CKD stage III;  Suspect related to heart failure.  monitor on lasix. Cr peak to 1.7 this admission.  Cr at 1.4 today   DM; Hypoglycemia. Hold insulin.   Hypothyroidism;  Continue with synthroid.   Chronc A fib;  No anticoagulation due to recent ICH.  Continue with Cardizem.  Continue with metoprolol/ metoprolol dose increase to 50 mg   History of dementia.  Continue with Aricept.   severe protein calorie malnutrition -Encourage oral diet.  History of [place] discharge 0-17; complicated by seizure.  On Vimpat for seizure.   Hypomagnesemia;  Start oral supplement. Received IV mg 9-27 Replaced.   Hypokalemia ; resolved  Vitamin D deficiency and Vitamin B 12 low normal range.  Started  supplement  DVT prophylaxis: SCD.  Code Status: DNR Family Communication: husband at bedside. Daughter over phone  9-30 Disposition Plan: remain inpatient for management of HF, HTN urgency , palliative care consult   Consultants:   Cardiology    Procedures:   none   Antimicrobials: none   Subjective: Complaints of headaches. Care giver at bedside report patient complaints of headaches at times.  Dyspnea improved.   Objective: Vitals:   04/21/18 2200 04/22/18 0439 04/22/18 1132 04/22/18 1133  BP: 121/68 129/66    Pulse: 80 61 86   Resp:  18    Temp:  98.2 F (36.8 C)    TempSrc:  Oral    SpO2:  98%  94%  Weight:  54.6 kg    Height:        Intake/Output Summary (Last 24 hours) at 04/22/2018 1407 Last data filed at 04/22/2018 1007 Gross per 24 hour  Intake 675 ml  Output 1050 ml  Net -375 ml   Filed Weights   04/20/18 0554 04/21/18 0215 04/22/18 0439  Weight: 53.1 kg 54.5 kg 54.6 kg    Examination:  General exam: NAD, thin appearing.  Respiratory system: Bilateral crackles.  Cardiovascular system: S 1, S 2  Gastrointestinal system: BS present, soft  Central nervous system:  Alert, follows command.  Extremities: no edema Skin: no rashes.     Data Reviewed: I have personally reviewed following labs and imaging studies  CBC: Recent Labs  Lab 04/17/18 1034 04/18/18 0513 04/20/18 0511 04/22/18 0752  WBC 7.6 8.0 6.7 6.3  NEUTROABS 5.5  --   --   --   HGB 11.3* 10.4* 12.1 11.5*  HCT 33.6* 30.2* 35.1* 33.7*  MCV 80.4 78.9 79.6 79.3  PLT 232 211 226 510   Basic Metabolic Panel: Recent Labs  Lab 04/18/18 0513 04/19/18 0740 04/20/18 0511 04/21/18 0606 04/22/18 0752  NA 145 141 139 139 137  K 3.2* 3.5 4.1 3.7 3.7  CL 105 98 96* 95* 96*  CO2 31 32 33* 36* 36*  GLUCOSE 102* 92 94 131* 84  BUN 26* 18 18 27* 28*  CREATININE 1.55* 1.16* 1.26* 1.45* 1.45*  CALCIUM 8.3* 8.5* 8.5* 8.7* 8.3*  MG 1.4*  --  1.4* 1.9  --    GFR: Estimated Creatinine Clearance: 22.4 mL/min (A) (by C-G formula based on SCr of 1.45 mg/dL (H)). Liver Function Tests: Recent Labs  Lab  04/17/18 1034 04/18/18 0513  AST 38 26  ALT 31 23  ALKPHOS 123 106  BILITOT 1.2 0.8  PROT 6.6 5.7*  ALBUMIN 2.8* 2.5*   No results for input(s): LIPASE, AMYLASE in the last 168 hours. No results for input(s): AMMONIA in the last 168 hours. Coagulation Profile: Recent Labs  Lab 04/18/18 0513  INR 1.38   Cardiac Enzymes: No results for input(s): CKTOTAL, CKMB, CKMBINDEX, TROPONINI in the last 168 hours. BNP (last 3 results) No results for input(s): PROBNP in the last 8760 hours. HbA1C: No results for input(s): HGBA1C in the last 72 hours. CBG: Recent Labs  Lab 04/21/18 1234 04/21/18 1627 04/21/18 2056 04/22/18 0752 04/22/18 1136  GLUCAP 112* 138* 147* 82 220*   Lipid Profile: No results for input(s): CHOL, HDL, LDLCALC, TRIG, CHOLHDL, LDLDIRECT  in the last 72 hours. Thyroid Function Tests: No results for input(s): TSH, T4TOTAL, FREET4, T3FREE, THYROIDAB in the last 72 hours. Anemia Panel: No results for input(s): VITAMINB12, FOLATE, FERRITIN, TIBC, IRON, RETICCTPCT in the last 72 hours. Sepsis Labs: No results for input(s): PROCALCITON, LATICACIDVEN in the last 168 hours.  No results found for this or any previous visit (from the past 240 hour(s)).       Radiology Studies: Dg Chest 2 View  Result Date: 04/21/2018 CLINICAL DATA:  Shortness of breath EXAM: CHEST - 2 VIEW COMPARISON:  04/17/2018 FINDINGS: The patient is rotated to the right on today's radiograph, reducing diagnostic sensitivity and specificity. Moderate enlargement of the cardiopericardial silhouette, similar to prior. Atherosclerotic calcification of the aortic arch. Continued airspace opacity obscuring the right hemidiaphragm, primarily in the right lower lobe, with some volume loss and likely underlying moderate to large right pleural effusion. Mildly improved aeration at the left lung base. Blunted right and possibly left costophrenic angles. Prominent main pulmonary artery contour. Degenerative  glenohumeral arthropathy bilaterally. IMPRESSION: 1. Roughly similar moderate to large right pleural effusion with passive atelectasis. Small left pleural effusion. Improved aeration at the left lung base. 2. Moderate cardiomegaly. 3.  Aortic Atherosclerosis (ICD10-I70.0). 4. Enlarged pulmonary artery, potentially reflecting pulmonary arterial hypertension. Electronically Signed   By: Van Clines M.D.   On: 04/21/2018 11:47        Scheduled Meds: . diltiazem  30 mg Oral BID  . donepezil  10 mg Oral QHS  . feeding supplement (GLUCERNA SHAKE)  237 mL Oral BID BM  . ferrous sulfate  325 mg Oral BID WC  . furosemide  40 mg Intravenous BID  . hydrALAZINE  25 mg Oral Q8H  . isosorbide mononitrate  30 mg Oral BID  . lacosamide  75 mg Oral BID  . levothyroxine  25 mcg Oral QAC breakfast  . lidocaine      . magnesium oxide  200 mg Oral BID  . metoprolol succinate  50 mg Oral Daily  . senna-docusate  1 tablet Oral Daily  . sertraline  50 mg Oral Daily  . vitamin B-12  100 mcg Oral Daily  . Vitamin D (Ergocalciferol)  50,000 Units Oral Q7 days   Continuous Infusions:   LOS: 5 days    Time spent: 35 minutes.     Elmarie Shiley, MD [facility] Pager [phone]  If 7PM-7AM, please contact night-coverage [email or web address] Password [email or web address] 04/22/2018, 2:07 PM

## 2018-04-22 NOTE — Progress Notes (Signed)
Patient ID: Nicole [name], female   DOB: 17-May-1931, 84 y.o.   MRN: [id number] Follow up CXR post right thoracentesis today reveals reduction in pleural effusion with small?ex vacuo pneumothorax. Findings d/w nurse. Will check f/u CXR in am to ensure resolution. If pt becomes more dyspneic or develops increasing CP obtain CXR immediately.

## 2018-04-22 NOTE — Procedures (Signed)
Ultrasound-guided diagnostic and therapeutic right thoracentesis performed yielding 620 cc of blood-tinged/amber colored fluid. No immediate complications. Follow-up chest x-ray pending. A portion of the fluid was sent to the lab for preordered studies.

## 2018-04-22 NOTE — Clinical Social Work Note (Signed)
CSW called patient's daughter and answered questions about getting long-term Medicaid.  CSW signing off. Consult again if any other social work needs arise.  Dayton Scrape, [place]

## 2018-04-22 NOTE — Progress Notes (Signed)
Physical Therapy Treatment Patient Details Name: Nicole [name] MRN: [id number] DOB: [birth year] Today's Date: 04/22/2018    History of Present Illness Pt is an 84 year old female admitted with SOB. PMHx: dementia, atrial fibrillation, CHF, CKD, T2DM, chronic respiratory failure on 3L home O2, recent CVA with left weakness    PT Comments    Pt napping on arrival, but agreeable to PT. Pt unable to identify person in room with her (aide 4-5 hours daily at home). Pt able to ambulate 70ft x2 with seated rest break. Pt with quick fatigue but unable to direct need to sit and rest, would say "needs to sit" then keep walking, secondary to cognition. Current plan remains appropriate.     Follow Up Recommendations  SNF     Equipment Recommendations  None recommended by PT    Recommendations for Other Services       Precautions / Restrictions Precautions Precautions: Fall Restrictions Weight Bearing Restrictions: No    Mobility  Bed Mobility               General bed mobility comments: in chair on arrival  Transfers Overall transfer level: Needs assistance   Transfers: Sit to/from Stand Sit to Stand: Min assist;Mod assist         General transfer comment: min first trial from chair, mod second trial after gait with assist to rise and cues for hand placement  Ambulation/Gait Ambulation/Gait assistance: Min assist;+2 safety/equipment Gait Distance (Feet): 38 Feet Assistive device: Rolling walker (2 wheeled) Gait Pattern/deviations: Step-through pattern;Decreased stride length;Trunk flexed   Gait velocity interpretation: <1.8 ft/sec, indicate of risk for recurrent falls General Gait Details: cues for position in RW with chair follow as pt unable to regulate activity and chair with fatigue. 2 trials of 40' of gait on 2L with SpO2 94%   Stairs             Wheelchair Mobility    Modified Rankin (Stroke Patients Only)       Balance Overall balance  assessment: Needs assistance   Sitting balance-Leahy Scale: Fair     Standing balance support: Bilateral upper extremity supported Standing balance-Leahy Scale: Poor                              Cognition Arousal/Alertness: Awake/alert Behavior During Therapy: Flat affect Overall Cognitive Status: History of cognitive impairments - at baseline                                        Exercises      General Comments        Pertinent Vitals/Pain Pain Assessment: No/denies pain    Home Living                      Prior Function            PT Goals (current goals can now be found in the care plan section) Progress towards PT goals: Progressing toward goals    Frequency    Min 3X/week      PT Plan Current plan remains appropriate    Co-evaluation              AM-PAC PT "6 Clicks" Daily Activity  Outcome Measure  Difficulty turning over in bed (including adjusting bedclothes, sheets and blankets)?: Unable Difficulty moving from lying on  back to sitting on the side of the bed? : Unable Difficulty sitting down on and standing up from a chair with arms (e.g., wheelchair, bedside commode, etc,.)?: Unable Help needed moving to and from a bed to chair (including a wheelchair)?: A Lot Help needed walking in hospital room?: A Little Help needed climbing 3-5 steps with a railing? : Total 6 Click Score: 9    End of Session Equipment Utilized During Treatment: Oxygen Activity Tolerance: Patient tolerated treatment well Patient left: in chair;with family/visitor present;with call bell/phone within reach Nurse Communication: Mobility status PT Visit Diagnosis: Unsteadiness on feet (R26.81);Muscle weakness (generalized) (M62.81);History of falling (Z91.81)     Time: 1884-1660 PT Time Calculation (min) (ACUTE ONLY): 21 min  Charges:  $Gait Training: 8-22 mins                     Samuella Bruin, [place]  [facility]  [phone]    Samuella Bruin 04/22/2018, 12:18 PM

## 2018-04-22 NOTE — Consult Note (Signed)
Consultation Note Date: 04/22/2018   Patient Name: Nicole [name]  DOB: January 30, 1931  MRN: [id number]  Age / Sex: 84 y.o., female  PCP: [name], DO Referring Physician: Elmarie Shiley, MD  Reason for Consultation: Establishing goals of care  HPI/Patient Profile: 84 y.o. female  with past medical history of diastolic heart failure on chronic 3L oxygen at home, atrial fibrillation, dementia, h/o stroke, CKD stage III, diabetes mellitus, HTN admitted on 04/17/2018 with SOB and generalized weakness r/t CHF exacerbation and requiring close monitoring of renal function while requiring IV Lasix.   Clinical Assessment and Goals of Care: I met today with Nicole [name] and her aide at bedside. Her functional status has been declining especially since her stroke at the beginning of the month. She has had increasing difficulties with heart failure and recurrent pleural effusions as well. One of her home aides is at bedside and tells that her she can now barely walk to bathroom with assistance whereas she was previously ambulating very well. Appetite has recently decreased and is more SOB. She was only home 2 days from [facility] rehab before admission. Ms. Rhett although very nice and friendly has dementia and poor memory and unable to add to conversation.   I called and spoke with her daughter, Peter Congo. Peter Congo is a Pharmacist, hospital in [place] and it is difficult for her to visit the hospital until the evenings. We had a conversation today about her mother's decline and heart failure trajectory. Peter Congo is aware of her mother's decline and accepting that this will eventually lead to her passing away. Peter Congo understands that our treatments here in the hospital eventually will not be enough to get rid of the fluid and Peter Congo is more interested in when this may happen and how we will know. Peter Congo at this time is more concerned with her  mother's care and that she is never alone but her mother's partner is elderly and unable to really care for her the way she needs and she has caregivers 4-5 hours daily. Peter Congo would feel better with her mother in a nursing facility and is interested in applying for long term care Medicaid.   We discussed GOC and home resources and even discussed hospice and how they could help. Peter Congo is interested in pursuing hospice services given her mother's poor prognosis to help and guide her at home and give them the support they need. Although at this time Peter Congo would want her mother to return to the hospital I still believe Nicole [name] would be a good candidate for hospice at home for support given poor prognosis and continued goals of care and education regarding diagnosis.   Recommend hospice referral for in home care and CSW to give direction/info to daughter to [facility] application.   Primary Decision Maker HCPOA daughter Peter Congo    SUMMARY OF RECOMMENDATIONS   - Hospice at home referral - They do desire rehospitalization at this time - Began conversation/education regarding expectations at EOL and disease trajectory as well as symptom management at  EOL  Code Status/Advance Care Planning:  DNR - already in place and consistent with Living Will   Symptom Management:   SOB: Per cardiology - continue IV Lasix and for thoracentesis today. May be role for PleurX in the future with continued accumulation of fluid.   Palliative Prophylaxis:   Aspiration, Bowel Regimen, Delirium Protocol, Frequent Pain Assessment and Turn Reposition  Psycho-social/Spiritual:   Desire for further Chaplaincy support:yes  Additional Recommendations: Caregiving  Support/Resources and Education on Hospice  Prognosis:   < 6 months  Discharge Planning: Home with Hospice      Primary Diagnoses: Present on Admission: . Acute diastolic CHF (congestive heart failure), NYHA class 3 ([place]) . History of colon  cancer, stage III . Acute respiratory distress . Diabetic nephropathy ([place]) . Hypertensive urgency . Type 2 diabetes mellitus with peripheral neuropathy (HCC) . Essential hypertension . GERD (gastroesophageal reflux disease)   I have reviewed the medical record, interviewed the patient and family, and examined the patient. The following aspects are pertinent.  Past Medical History:  Diagnosis Date  . AAA (abdominal aortic aneurysm) ([place])   . Anemia   . Anxiety   . Arthritis   . Atrial fibrillation ([place])   . Cervicalgia   . Cholelithiasis   . Chronic systolic heart failure ([place])   . CKD (chronic kidney disease)   . Colon cancer ([place]) 07/1997  . Depression   . Diabetes mellitus ([place])   . Dizziness and giddiness   . Electrolyte and fluid disorders not elsewhere classified   . GERD (gastroesophageal reflux disease)   . Gout, unspecified   . Hemorrhage of rectum and anus   . Hypercholesteremia   . Hypertension   . Hypoglycemia, unspecified   . Hypopotassemia   . Hypothyroidism   . Kidney stone    renal calculi  . Other chronic allergic conjunctivitis   . Other specified cardiac dysrhythmias(427.89)   . Pain in joint, lower leg   . Perforation of bile duct   . Proteinuria   . Shortness of breath   . Small bowel obstruction due to adhesions ([place]) 05/16/2012  . Stroke ([place])   . Swelling, mass, or lump in head and neck   . Thoracic aneurysm without mention of rupture    4.6 cm Asc Aortic aneurysm, CT 07/2015   Social History   Socioeconomic History  . Marital status: Widowed    Spouse name: Not on file  . Number of children: Not on file  . Years of education: Not on file  . Highest education level: Not on file  Occupational History  . Not on file  Social Needs  . Financial resource strain: Not hard at all  . Food insecurity:    Worry: Never true    Inability: Never true  . Transportation needs:    Medical: No    Non-medical: No  Tobacco Use  . Smoking  status: Never Smoker  . Smokeless tobacco: Never Used  Substance and Sexual Activity  . Alcohol use: No  . Drug use: No  . Sexual activity: Never  Lifestyle  . Physical activity:    Days per week: 0 days    Minutes per session: 0 min  . Stress: Not at all  Relationships  . Social connections:    Talks on phone: More than three times a week    Gets together: More than three times a week    Attends religious service: 1 to 4 times per year    Active  member of club or organization: Yes    Attends meetings of clubs or organizations: Never    Relationship status: Widowed  Other Topics Concern  . Not on file  Social History Narrative  . Not on file   Family History  Problem Relation Age of Onset  . Heart disease Mother   . Stroke Father   . Hypertension Daughter   . Hypertension Son   . Diabetes Sister   . Hypertension Son    Scheduled Meds: . diltiazem  30 mg Oral BID  . donepezil  10 mg Oral QHS  . feeding supplement (GLUCERNA SHAKE)  237 mL Oral BID BM  . ferrous sulfate  325 mg Oral BID WC  . furosemide  40 mg Intravenous BID  . hydrALAZINE  25 mg Oral Q8H  . isosorbide mononitrate  30 mg Oral BID  . lacosamide  75 mg Oral BID  . levothyroxine  25 mcg Oral QAC breakfast  . magnesium oxide  200 mg Oral BID  . metoprolol succinate  50 mg Oral Daily  . senna-docusate  1 tablet Oral Daily  . sertraline  50 mg Oral Daily  . vitamin B-12  100 mcg Oral Daily  . Vitamin D (Ergocalciferol)  50,000 Units Oral Q7 days   Continuous Infusions: PRN Meds:.acetaminophen **OR** acetaminophen, bisacodyl, hydrALAZINE, HYDROcodone-acetaminophen, ipratropium, ondansetron **OR** ondansetron (ZOFRAN) IV Allergies  Allergen Reactions  . Iohexol Nausea And Vomiting  . Iodinated Diagnostic Agents Nausea And Vomiting  . Z-Pak [Azithromycin] Other (See Comments)    Reaction unknown by family   Review of Systems  Constitutional: Positive for activity change and appetite change.    Respiratory: Positive for shortness of breath.   Neurological: Positive for weakness.    Physical Exam  Constitutional: She appears well-developed. She appears ill.  HENT:  Head: Normocephalic and atraumatic.  Cardiovascular: Normal rate. An irregularly irregular rhythm present.  Pulmonary/Chest: No accessory muscle usage. No tachypnea. She is in respiratory distress.  SOB at rest  Abdominal: Normal appearance.  Neurological: She is alert. She is disoriented.  Vitals reviewed.   Vital Signs: BP 129/66 (BP Location: Right Arm)   Pulse 86   Temp 98.2 F (36.8 C) (Oral)   Resp 18   Ht 5' 2"  (1.575 m)   Wt 54.6 kg   SpO2 94%   BMI 22.02 kg/m  Pain Scale: 0-10   Pain Score: 0-No pain   SpO2: SpO2: 94 % O2 Device:SpO2: 94 % O2 Flow Rate: .O2 Flow Rate (L/min): 2 L/min  IO: Intake/output summary:   Intake/Output Summary (Last 24 hours) at 04/22/2018 1307 Last data filed at 04/22/2018 1007 Gross per 24 hour  Intake 675 ml  Output 1050 ml  Net -375 ml    LBM: Last BM Date: 04/22/18 Baseline Weight: Weight: 56.5 kg Most recent weight: Weight: 54.6 kg     Palliative Assessment/Data: 40%     Time In: 1300 Time Out: 1410 Time Total: 70 min Greater than 50%  of this time was spent counseling and coordinating care related to the above assessment and plan.  Signed by: Vinie Sill, NP Palliative Medicine Team Pager # [phone] (M-F 8a-5p) Team Phone # [phone] (Nights/Weekends)

## 2018-04-23 ENCOUNTER — Telehealth: Payer: Self-pay

## 2018-04-23 ENCOUNTER — Inpatient Hospital Stay (HOSPITAL_COMMUNITY): Payer: Medicare HMO

## 2018-04-23 DIAGNOSIS — I5033 Acute on chronic diastolic (congestive) heart failure: Secondary | ICD-10-CM

## 2018-04-23 DIAGNOSIS — R0602 Shortness of breath: Secondary | ICD-10-CM

## 2018-04-23 DIAGNOSIS — J9621 Acute and chronic respiratory failure with hypoxia: Secondary | ICD-10-CM

## 2018-04-23 DIAGNOSIS — Z515 Encounter for palliative care: Secondary | ICD-10-CM

## 2018-04-23 DIAGNOSIS — Z7189 Other specified counseling: Secondary | ICD-10-CM

## 2018-04-23 LAB — GLUCOSE, CAPILLARY
GLUCOSE-CAPILLARY: 130 mg/dL — AB (ref 70–99)
GLUCOSE-CAPILLARY: 269 mg/dL — AB (ref 70–99)
Glucose-Capillary: 82 mg/dL (ref 70–99)
Glucose-Capillary: 114 mg/dL — ABNORMAL HIGH (ref 70–99)

## 2018-04-23 LAB — BASIC METABOLIC PANEL
Anion gap: 4 — ABNORMAL LOW (ref 5–15)
BUN: 32 mg/dL — ABNORMAL HIGH (ref 8–23)
CHLORIDE: 96 mmol/L — AB (ref 98–111)
CO2: 36 mmol/L — ABNORMAL HIGH (ref 22–32)
Calcium: 8 mg/dL — ABNORMAL LOW (ref 8.9–10.3)
Creatinine, Ser: 1.44 mg/dL — ABNORMAL HIGH (ref 0.44–1.00)
GFR calc Af Amer: 37 mL/min — ABNORMAL LOW (ref >60)
GFR calc non Af Amer: 32 mL/min — ABNORMAL LOW (ref >60)
Glucose, Bld: 94 mg/dL (ref 70–99)
POTASSIUM: 3.5 mmol/L (ref 3.5–5.1)
SODIUM: 136 mmol/L (ref 135–145)

## 2018-04-23 LAB — MAGNESIUM: MAGNESIUM: 1.9 mg/dL (ref 1.7–2.4)

## 2018-04-23 MED ORDER — FUROSEMIDE 40 MG PO TABS
40.0000 mg | ORAL_TABLET | Freq: Two times a day (BID) | ORAL | Status: DC
  Administered 2018-04-23 – 2018-04-25 (×4): 40 mg via ORAL
  Filled 2018-04-23 (×4): qty 1

## 2018-04-23 NOTE — Progress Notes (Signed)
[facility])   Notified by RN CM of family request for [facility] services at home after discharge.  Chart and patient information under review by [facility] physician. Hospice eligibility is pending at this time.    Spoke with Cecil Cranker (friend) by phone to initiate education related to hospice philosophy, services and team approach to care.  Please send signed and completed DNR form home with patient, once eligibility confirmed.  DME needs have been discussed, and pt currently has the following at home:  Walker, O2 @ 3lpm [facility]).  Family denies any additional equipment at this time.  Completed discharge summary will need to be faxed to [phone] when final.    [facility] will continue to follow and update once eligibility has been confirmed.    Please call with any hospice related questions  Thank you for this referral  Venia Carbon BSN, RN  [facility] Liaison  (listed in [place]) [phone]

## 2018-04-23 NOTE — Care Management Note (Addendum)
Case Management Note  Patient Details  Name: Nicole [name] MRN: [id number] Date of Birth: 03/02/1931  Subjective/Objective:  Acute on chronic CHF with pleural effusions, afib, HTN, CKD                   Action/Plan: Referral sent to arrange Home Hospice. Pt lives at home with boyfriend and caregiver's that assist her during the day on Mon-Fri, Sun from 9-2pm. Her HCPOA, Nicole [name]. Contacted pt's HCPOA and offered choice for Home Hospice. She is agreeable to Nicole [name]. Nicole [name] states pt has oxygen [facility]) and RW at home. Contacted [facility] with new referral. [facility] will order additional DME for home.    Expected Discharge Date:                Expected Discharge Plan:  Home w Hospice Care  In-House Referral:  NA  Discharge planning Services  CM Consult  Post Acute Care Choice:  Hospice Choice offered to:  Adult Children  DME Arranged:  Other see comment DME Agency:  [place]:  Social Work, Therapist, sports [facility] Agency:  [facility] [place]  Status of Service:  Completed, signed off  If discussed at [employer], dates discussed:    Additional Comments:  Nicole Rasher, RN 04/23/2018, 11:52 AM

## 2018-04-23 NOTE — Telephone Encounter (Signed)
Yes I'm happy to

## 2018-04-23 NOTE — Progress Notes (Signed)
Palliative:  Nicole [name] is lying in bed and is pleasant and at baseline confusion. She is visibly short of breath but oxygen is also hanging around her neck. I assisted her with her oxygen back on. I fear that Nicole [name] will continue to be symptomatic despite diuresis. There is likely a role for very low dose opioid for SOB relief (recommend morphine po 1-2 mg to begin given age and frailty). No family/visitors at bedside currently. Daughter, Peter Congo, can be reached with difficulty. Hospice requested to follow at home. If Nicole [name] continues to be SOB at rest tomorrow I will reach back out to [facility] to further discuss progression and to consider more comfort focused care. I anticipate that these conversations will need to be continued once Nicole [name] returns home.   Exam: Alert, appears fatigued. Elderly, frail. SOB at rest.   20 min  Vinie Sill, NP Palliative Medicine Team Pager # [phone] (M-F 8a-5p) Team Phone # [phone] (Nights/Weekends)

## 2018-04-23 NOTE — Progress Notes (Signed)
Occupational Therapy Treatment Patient Details Name: Nicole [name] MRN: [id number] DOB: 1930-09-29 Today's Date: 04/23/2018    History of present illness Pt is an 84 year old female admitted with SOB. PMHx: dementia, atrial fibrillation, CHF, CKD, T2DM, chronic respiratory failure on 3L home O2, recent CVA with left weakness   OT comments  Pt progressing towards acute OT goals. Focus of session was bed mobility and simulated toilet transfer (EOB>recliner). Pt very lethargic at start of session, more alert, but still very sleepy, once sitting EOB. Pt needed +1 assist to complete bed mobility and stand-pivot transfer. Friend of the family present during session.  D/c plan updated below.    Follow Up Recommendations  Supervision/Assistance - 24 hour;Other (comment);SNF(HCPOA has elected [place])    Equipment Recommendations  None recommended by OT    Recommendations for Other Services      Precautions / Restrictions Precautions Precautions: Fall Restrictions Weight Bearing Restrictions: No       Mobility Bed Mobility Overal bed mobility: Needs Assistance Bed Mobility: Supine to Sit     Supine to sit: Max assist;HOB elevated     General bed mobility comments: HOB elevated. Pt very letheragic. Assist for all aspects of bed mobility. Utilized bed pad to facilitate pivoting hips.  Transfers Overall transfer level: Needs assistance Equipment used: Rolling walker (2 wheeled) Transfers: Sit to/from [facility] Sit to Stand: Mod assist Stand pivot transfers: Min assist;Mod assist       General transfer comment: elevated bed height, cues for sequencing. Therapist stabilized rw for pt to pull up on. Utilized bed pad to facilitate hip extension. Cues for position of rw during pivoting and approach to recliner.     Balance Overall balance assessment: Needs assistance Sitting-balance support: Feet supported Sitting balance-Leahy Scale: Fair      Standing balance support: Bilateral upper extremity supported Standing balance-Leahy Scale: Poor Standing balance comment: rw for balance                           ADL either performed or assessed with clinical judgement   ADL Overall ADL's : Needs assistance/impaired                         Toilet Transfer: Moderate assistance;Stand-pivot;BSC;RW Toilet Transfer Details (indicate cue type and reason): simulated with EOB>recliner, assist to powerup to standing. Cues for sequencing and positioning of rw.           General ADL Comments: Pt completed bed mobility, sat EOB 1-2 minutes then stood to complete SPT. Pt stood in place a minute prior to sitting down.      Vision       Perception     Praxis      Cognition Arousal/Alertness: Lethargic Behavior During Therapy: Flat affect Overall Cognitive Status: History of cognitive impairments - at baseline                                 General Comments: very lethargic, resting in bed upon OT arrival. More alert once sitting up EOB but still sleepy.         Exercises     Shoulder Instructions       General Comments Family friend present during session    Pertinent Vitals/ Pain       Pain Assessment: No/denies pain  Home Living  Prior Functioning/Environment              Frequency  Min 2X/week        Progress Toward Goals  OT Goals(current goals can now be found in the care plan section)  Progress towards OT goals: Progressing toward goals  Acute Rehab OT Goals Patient Stated Goal: rest, get warmer  OT Goal Formulation: Patient unable to participate in goal setting Time For Goal Achievement: 05/02/18 Potential to Achieve Goals: Good ADL Goals Pt Will Perform Eating: with min assist;sitting Pt Will Perform Grooming: with min assist;sitting Pt Will Perform Upper Body Dressing: with min assist;sitting Pt  Will Transfer to Toilet: with min assist;ambulating;bedside commode Additional ADL Goal #1: Pt will perform bed mobility with min assist in preparation for ADL.  Plan Discharge plan remains appropriate    Co-evaluation                 AM-PAC PT "6 Clicks" Daily Activity     Outcome Measure   Help from another person eating meals?: Total Help from another person taking care of personal grooming?: Total Help from another person toileting, which includes using toliet, bedpan, or urinal?: Total Help from another person bathing (including washing, rinsing, drying)?: Total Help from another person to put on and taking off regular upper body clothing?: Total Help from another person to put on and taking off regular lower body clothing?: Total 6 Click Score: 6    End of Session Equipment Utilized During Treatment: Gait belt;Rolling walker;Oxygen  OT Visit Diagnosis: Unsteadiness on feet (R26.81);Other abnormalities of gait and mobility (R26.89);Muscle weakness (generalized) (M62.81);Other symptoms and signs involving cognitive function   Activity Tolerance Patient limited by lethargy   Patient Left in chair;with call bell/phone within reach;with chair alarm set;with family/visitor present   Nurse Communication Other (comment)(pt reporting itchiness back, neck and legs)        Time: 7078-6754 OT Time Calculation (min): 27 min  Charges: OT General Charges $OT Visit: 1 Visit OT Treatments $Self Care/Home Management : 23-37 mins  Tyrone Schimke, OT Acute Rehabilitation Services Pager: [phone] Office: [phone]    Hortencia Pilar 04/23/2018, 12:25 PM

## 2018-04-23 NOTE — Progress Notes (Signed)
Follow up CXR this am reviewed with Dr. Pascal Lux. Slight decrease in size of post thora PTX. Expect continued resolution.  Ascencion Dike PA-C Interventional Radiology 04/23/2018 8:54 AM

## 2018-04-23 NOTE — Progress Notes (Signed)
PROGRESS NOTE   Nicole [name]  HBZ:[id number]    DOB: [birth year]    DOA: 04/17/2018  PCP: [name], DO   I have briefly reviewed patients previous medical records in [facility].  Brief Narrative:  84 year old female, PMH of chronic respiratory failure with hypoxia on 3 L/min oxygen via nasal cannula, chronic diastolic CHF, chronic A. fib, dementia, stage III CKD, DM 2, HTN, HLD CVA, hypothyroid, recently hospitalized 03/17/2018-03/26/2018 for acute CVA complicated by seizures and acute respiratory failure with hypoxia, acute on chronic diastolic CHF, bilateral pleural effusions, left > right for which he underwent 1.3 L thoracentesis, acute on chronic kidney disease, stabilized and discharged to [facility] where she stayed for about 3 weeks and went home 2 days PTA, continued to feel very weak, unable to get out of bed much and presented to ED 9/26 with generalized weakness and progressive dyspnea.  Of note patient was taking Lasix only prescribed as needed.  In ED, SBP 194 and clinical picture consistent with decompensated CHF.   Assessment & Plan:   Principal Problem:   Acute respiratory distress Active Problems:   History of colon cancer, stage III   Diabetic nephropathy (HCC)   Hypertensive urgency   Type 2 diabetes mellitus with peripheral neuropathy (HCC)   Essential hypertension   General weakness   GERD (gastroesophageal reflux disease)   Acute diastolic CHF (congestive heart failure), NYHA class 3 (HCC)   Goals of care, counseling/discussion   Encounter for hospice care discussion   Palliative care encounter   Acute on chronic diastolic CHF: Cardiology consulted and assisted with care.  Treated initially with IV Lasix 40 mg 3 times daily.  -3.345 L since admission.  Weight down by about 7 pounds since admission.  Clinically euvolemic and has been transitioned to oral Lasix 40 mg twice daily.  Cardiology signed off and will arrange outpatient  follow-up.  Right pleural effusion status post thoracentesis: Underwent ultrasound-guided thoracentesis by IR on 10/1 yielding 620 mL fluid.  She developed small presumably ex vacuo pneumothorax postprocedure which on follow-up chest x-ray has reduced.  IR followed up.  Acute on chronic respiratory failure with hypoxia: Secondary to decompensated CHF and pleural effusion.  Treat underlying cause and titrate oxygen as needed.  Palliative care team input appreciated, recommend considering low-dose opioid for dyspnea relief and they will follow up tomorrow and with family.  Permanent atrial fibrillation: Rate controlled on Cardizem and Toprol-XL.  Not a candidate for anticoagulation due to recent intracranial bleed.  Hypertensive urgency/essential hypertension: Controlled.  Remains on Cardizem, hydralazine, Imdur and metoprolol.  Acute on stage III chronic kidney disease: Baseline creatinine 1.2-1.4.  Creatinine had peaked to 1.7 this admission.  Creatinine stable in the 1.45 range over the last 2 days.  Follow BMP closely.  Dementia: As per caregiver, progressive worsening mental status since her discharge in August to SNF.  Worsened mental status changes may be related to recent intracranial bleed.  Palliative care consulted for goals of care.  Continue Aricept.  DM 2 with hypoglycemia: Insulins held.  Hypoglycemia resolved.  CBGs at times mildly uncontrolled and fluctuating.  Hypothyroid: Synthroid.  Severe protein calorie malnutrition:  Intracranial hemorrhage 1/0/1751 complicated by seizures: Continue Vimpat for seizure prevention.  Hypokalemia and hypomagnesemia: Replaced.  Vitamin D deficiency: Replace and follow.  Normocytic anemia: Stable.  Adult failure to thrive: Multifactorial secondary to advanced age, dementia, multiple severe significant comorbidities and recent acute illnesses.  Palliative care consulting for goals of  care and likely DC home with hospice.   DVT  prophylaxis: SCDs Code Status: DNR Family Communication: Discussed with patient's caregiver and friend at bedside. Disposition: As per PMT, DC home with hospice pending clinical improvement.   Consultants:  Cardiology Palliative medicine Interventional radiology  Procedures:  Right therapeutic thoracentesis 10/1  Antimicrobials:  None   Subjective: Patient confused this morning and unable to provide adequate history.  As per caregiver at bedside, patient had worsening confusion since last hospital discharge to SNF and never really recovered.  Dyspnea improved.  No pain reported.  ROS: As above, otherwise unable due to mental status change.  Objective:  Vitals:   04/23/18 0325 04/23/18 0557 04/23/18 0822 04/23/18 1328  BP: (!) 142/84 (!) 146/82 121/77 109/63  Pulse: 63  73 76  Resp: 18   18  Temp: 97.8 F (36.6 C)   98 F (36.7 C)  TempSrc: Oral     SpO2: 100%  92% 100%  Weight: 53.2 kg     Height:        Examination:  General exam: Pleasant elderly female, moderately built and frail, sitting up comfortably in bed without distress. Respiratory system: Diminished breath sounds in the bases with occasional basal crackles but otherwise clear to auscultation. Respiratory effort normal. Cardiovascular system: S1 & S2 heard, RRR. No JVD, murmurs, rubs, gallops or clicks. No pedal edema.  Telemetry personally reviewed: A. fib/flutter with controlled ventricular rate. Gastrointestinal system: Abdomen is nondistended, soft and nontender. No organomegaly or masses felt. Normal bowel sounds heard. Central nervous system: Alert and oriented only to self. No focal neurological deficits. Extremities: Symmetric 5 x 5 power. Skin: No rashes, lesions or ulcers Psychiatry: Judgement and insight impaired. Mood & affect appropriate.     Data Reviewed: I have personally reviewed following labs and imaging studies  CBC: Recent Labs  Lab 04/17/18 1034 04/18/18 0513 04/20/18 0511  04/22/18 0752  WBC 7.6 8.0 6.7 6.3  NEUTROABS 5.5  --   --   --   HGB 11.3* 10.4* 12.1 11.5*  HCT 33.6* 30.2* 35.1* 33.7*  MCV 80.4 78.9 79.6 79.3  PLT 232 211 226 119   Basic Metabolic Panel: Recent Labs  Lab 04/18/18 0513 04/19/18 0740 04/20/18 0511 04/21/18 0606 04/22/18 0752 04/23/18 0427  NA 145 141 139 139 137 136  K 3.2* 3.5 4.1 3.7 3.7 3.5  CL 105 98 96* 95* 96* 96*  CO2 31 32 33* 36* 36* 36*  GLUCOSE 102* 92 94 131* 84 94  BUN 26* 18 18 27* 28* 32*  CREATININE 1.55* 1.16* 1.26* 1.45* 1.45* 1.44*  CALCIUM 8.3* 8.5* 8.5* 8.7* 8.3* 8.0*  MG 1.4*  --  1.4* 1.9  --  1.9   Liver Function Tests: Recent Labs  Lab 04/17/18 1034 04/18/18 0513  AST 38 26  ALT 31 23  ALKPHOS 123 106  BILITOT 1.2 0.8  PROT 6.6 5.7*  ALBUMIN 2.8* 2.5*   Coagulation Profile: Recent Labs  Lab 04/18/18 0513  INR 1.38   CBG: Recent Labs  Lab 04/22/18 1136 04/22/18 1635 04/22/18 2136 04/23/18 0748 04/23/18 1301  GLUCAP 220* 103* 89 82 114*    Recent Results (from the past 240 hour(s))  Gram stain     Status: None   Collection Time: 04/22/18  3:28 PM  Result Value Ref Range Status   Specimen Description PLEURAL  Final   Special Requests NONE  Final   Gram Stain   Final    WBC PRESENT,  PREDOMINANTLY MONONUCLEAR NO ORGANISMS SEEN CYTOSPIN SMEAR Performed at [facility], [place] [address], [place], [place] [address]    Report Status 04/22/2018 FINAL  Final  Culture, body fluid-bottle     Status: None (Preliminary result)   Collection Time: 04/22/18  3:28 PM  Result Value Ref Range Status   Specimen Description PERITONEAL  Final   Special Requests NONE  Final   Culture   Final    NO GROWTH < 24 HOURS Performed at [facility], [place] [address] [place], [place] [address]    Report Status PENDING  Incomplete         Radiology Studies: Dg Chest 1 View  Result Date: 04/22/2018 CLINICAL DATA:  Post right-sided thoracentesis. EXAM: CHEST  1 VIEW COMPARISON:   04/21/2018; 04/17/2018; ultrasound-guided right-sided thoracentesis - 03/19/2018; chest CT - 02/09/2018 FINDINGS: Grossly unchanged enlarged cardiac silhouette and mediastinal contours with prominence of the main pulmonary artery. Atherosclerotic plaque within the thoracic aorta. Interval reduction/resolution of right-sided pleural effusion post thoracentesis with development of a small presumably ex vacuo right-sided pneumothorax. No mediastinal shift. Pulmonary vasculature remains indistinct with cephalization of flow. Unchanged trace left-sided pleural effusion. Improved aeration of lung bases with persistent bibasilar opacities, likely atelectasis. No new focal airspace opacities. No acute osseous abnormalities. Post cholecystectomy. IMPRESSION: 1. Interval reduction/resolution of right-sided pneumothorax post thoracentesis with development of a small presumably ex vacuo pneumothorax. Continued attention on follow-up is advised. 2. Improved aeration of lung bases with persistent bibasilar opacities, likely atelectasis. 3. Similar findings cardiomegaly, pulmonary edema and trace left-sided pleural effusion. Electronically Signed   By: Sandi Mariscal M.D.   On: 04/22/2018 15:31   Dg Chest Port 1 View  Result Date: 04/23/2018 CLINICAL DATA:  Shortness of breath EXAM: PORTABLE CHEST 1 VIEW COMPARISON:  Portable chest x-ray of 04/22/2018 and 04/21/2017 FINDINGS: There has been slight decrease in volume of the right pneumothorax. Aeration has decreased slightly. Bilateral pleural effusions remain with bibasilar volume loss right greater than left. Cardiomegaly is stable. IMPRESSION: 1. Slight decrease in volume of small right apical pneumothorax. 2. No change in bilateral pleural effusions with bibasilar volume loss right greater than left. Stable cardiomegaly Electronically Signed   By: Ivar Drape M.D.   On: 04/23/2018 08:31   Ir Thoracentesis Asp Pleural Space W/img Guide  Result Date: 04/22/2018 INDICATION:  Patient with history of heart failure, colon cancer, chronic kidney disease, dementia, malnutrition, recurrent right pleural effusion; request received for diagnostic and therapeutic right thoracentesis. EXAM: ULTRASOUND GUIDED DIAGNOSTIC AND THERAPEUTIC RIGHT THORACENTESIS MEDICATIONS: None COMPLICATIONS: SIR Level A - No therapy, no consequence. Procedure complicated by development of a small asymptomatic presumably ex vacuo right-sided pneumothorax PROCEDURE: An ultrasound guided thoracentesis was thoroughly discussed with the patient/family and questions answered. The benefits, risks, alternatives and complications were also discussed. The patient understands and wishes to proceed with the procedure. Written consent was obtained. Ultrasound was performed to localize and mark an adequate pocket of fluid in the right chest. The area was then prepped and draped in the normal sterile fashion. 1% Lidocaine was used for local anesthesia. Under ultrasound guidance a 6 Fr Safe-T-Centesis catheter was introduced. Thoracentesis was performed. The catheter was removed and a dressing applied. FINDINGS: A total of approximately 620 cc of blood-tinged/amber fluid was removed. Samples were sent to the laboratory as requested by the clinical team. IMPRESSION: Successful ultrasound guided diagnostic and therapeutic right thoracentesis yielding 620 cc of pleural fluid. Read by: Rowe Robert, PA-C Electronically  Signed   By: Sandi Mariscal M.D.   On: 04/22/2018 15:15        Scheduled Meds: . diltiazem  30 mg Oral BID  . donepezil  10 mg Oral QHS  . feeding supplement (GLUCERNA SHAKE)  237 mL Oral BID BM  . ferrous sulfate  325 mg Oral BID WC  . furosemide  40 mg Oral BID  . hydrALAZINE  25 mg Oral Q8H  . isosorbide mononitrate  30 mg Oral BID  . lacosamide  75 mg Oral BID  . levothyroxine  25 mcg Oral QAC breakfast  . magnesium oxide  200 mg Oral BID  . metoprolol succinate  50 mg Oral Daily  . senna-docusate  1  tablet Oral Daily  . sertraline  50 mg Oral Daily  . vitamin B-12  100 mcg Oral Daily  . Vitamin D (Ergocalciferol)  50,000 Units Oral Q7 days   Continuous Infusions:   LOS: 6 days     Vernell Leep, MD, FACP, [facility]. [facility] Pager [phone]  If 7PM-7AM, please contact night-coverage [email or web address] Password [email or web address] 04/23/2018, 3:57 PM

## 2018-04-23 NOTE — Telephone Encounter (Signed)
Nicole [name] with [facility] called to ask if Dr. Mariea Clonts would be willing to be the attending physician for patient. Patient has requested Dr. Mariea Clonts if possible.    Please advise

## 2018-04-23 NOTE — Care Management Important Message (Signed)
Important Message  Patient Details  Name: Nicole [name] MRN: [id number] Date of Birth: [birth year]   Medicare Important Message Given:  Yes    Erenest Rasher, RN 04/23/2018, 11:35 AM

## 2018-04-23 NOTE — Progress Notes (Addendum)
Progress Note  Patient Name: Nicole [name] Date of Encounter: 04/23/2018  Primary Cardiologist: Pixie Casino, MD  Subjective   Pt is very sleepy this morning. Reports not sleeping well overnight. Working with OT now.   Inpatient Medications    Scheduled Meds: . diltiazem  30 mg Oral BID  . donepezil  10 mg Oral QHS  . feeding supplement (GLUCERNA SHAKE)  237 mL Oral BID BM  . ferrous sulfate  325 mg Oral BID WC  . furosemide  40 mg Intravenous BID  . hydrALAZINE  25 mg Oral Q8H  . isosorbide mononitrate  30 mg Oral BID  . lacosamide  75 mg Oral BID  . levothyroxine  25 mcg Oral QAC breakfast  . magnesium oxide  200 mg Oral BID  . metoprolol succinate  50 mg Oral Daily  . senna-docusate  1 tablet Oral Daily  . sertraline  50 mg Oral Daily  . vitamin B-12  100 mcg Oral Daily  . Vitamin D (Ergocalciferol)  50,000 Units Oral Q7 days   Continuous Infusions:  PRN Meds: acetaminophen **OR** acetaminophen, bisacodyl, hydrALAZINE, HYDROcodone-acetaminophen, ipratropium, ondansetron **OR** ondansetron (ZOFRAN) IV   Vital Signs    Vitals:   04/22/18 1915 04/23/18 0325 04/23/18 0557 04/23/18 0822  BP: (!) 137/92 (!) 142/84 (!) 146/82 121/77  Pulse: 66 63  73  Resp: 16 18    Temp: 98.2 F (36.8 C) 97.8 F (36.6 C)    TempSrc: Oral Oral    SpO2: 98% 100%  92%  Weight:  53.2 kg    Height:        Intake/Output Summary (Last 24 hours) at 04/23/2018 1119 Last data filed at 04/23/2018 1009 Gross per 24 hour  Intake 240 ml  Output 400 ml  Net -160 ml   Filed Weights   04/21/18 0215 04/22/18 0439 04/23/18 0325  Weight: 54.5 kg 54.6 kg 53.2 kg   Physical Exam   General: Frail, elderly, NAD Skin: Warm, dry, intact  Head: Normocephalic, atraumatic, clear, moist mucus membranes. Neck: Negative for carotid bruits. No JVD Lungs: Diminished in bilateral bases. No wheezes, rales, or rhonchi. Breathing is unlabored. Cardiovascular: Irregularly irregular with S1 S2.  No murmurs, rubs, gallops, or LV heave appreciated. Abdomen: Soft, non-tender, non-distended with normoactive bowel sounds. No obvious abdominal masses. MSK: Strength and tone appear normal for age. 5/5 in all extremities Extremities: No edema. No clubbing or cyanosis. DP/PT pulses 2+ bilaterally Neuro: Alert and oriented. No focal deficits. No facial asymmetry. MAE spontaneously. Psych: Responds to questions appropriately with normal affect.    Labs    Chemistry Recent Labs  Lab 04/17/18 1034 04/18/18 0513  04/21/18 0606 04/22/18 0752 04/23/18 0427  NA 145 145   < > 139 137 136  K 4.5 3.2*   < > 3.7 3.7 3.5  CL 107 105   < > 95* 96* 96*  CO2 26 31   < > 36* 36* 36*  GLUCOSE 111* 102*   < > 131* 84 94  BUN 26* 26*   < > 27* 28* 32*  CREATININE 1.76* 1.55*   < > 1.45* 1.45* 1.44*  CALCIUM 8.9 8.3*   < > 8.7* 8.3* 8.0*  PROT 6.6 5.7*  --   --   --   --   ALBUMIN 2.8* 2.5*  --   --   --   --   AST 38 26  --   --   --   --  ALT 31 23  --   --   --   --   ALKPHOS 123 106  --   --   --   --   BILITOT 1.2 0.8  --   --   --   --   GFRNONAA 25* 29*   < > 32* 32* 32*  GFRAA 29* 34*   < > 37* 37* 37*  ANIONGAP 12 9   < > 8 5 4*   < > = values in this interval not displayed.     Hematology Recent Labs  Lab 04/18/18 0513 04/20/18 0511 04/22/18 0752  WBC 8.0 6.7 6.3  RBC 3.83* 4.41 4.25  HGB 10.4* 12.1 11.5*  HCT 30.2* 35.1* 33.7*  MCV 78.9 79.6 79.3  MCH 27.2 27.4 27.1  MCHC 34.4 34.5 34.1  RDW 15.9* 16.9* 16.6*  PLT 211 226 217    Cardiac EnzymesNo results for input(s): TROPONINI in the last 168 hours.  Recent Labs  Lab 04/17/18 1051  TROPIPOC 0.03     BNP Recent Labs  Lab 04/17/18 1034  BNP 2,577.0*     DDimer No results for input(s): DDIMER in the last 168 hours.   Radiology    Dg Chest 1 View  Result Date: 04/22/2018 CLINICAL DATA:  Post right-sided thoracentesis. EXAM: CHEST  1 VIEW COMPARISON:  04/21/2018; 04/17/2018; ultrasound-guided right-sided  thoracentesis - 03/19/2018; chest CT - 02/09/2018 FINDINGS: Grossly unchanged enlarged cardiac silhouette and mediastinal contours with prominence of the main pulmonary artery. Atherosclerotic plaque within the thoracic aorta. Interval reduction/resolution of right-sided pleural effusion post thoracentesis with development of a small presumably ex vacuo right-sided pneumothorax. No mediastinal shift. Pulmonary vasculature remains indistinct with cephalization of flow. Unchanged trace left-sided pleural effusion. Improved aeration of lung bases with persistent bibasilar opacities, likely atelectasis. No new focal airspace opacities. No acute osseous abnormalities. Post cholecystectomy. IMPRESSION: 1. Interval reduction/resolution of right-sided pneumothorax post thoracentesis with development of a small presumably ex vacuo pneumothorax. Continued attention on follow-up is advised. 2. Improved aeration of lung bases with persistent bibasilar opacities, likely atelectasis. 3. Similar findings cardiomegaly, pulmonary edema and trace left-sided pleural effusion. Electronically Signed   By: Sandi Mariscal M.D.   On: 04/22/2018 15:31   Dg Chest 2 View  Result Date: 04/21/2018 CLINICAL DATA:  Shortness of breath EXAM: CHEST - 2 VIEW COMPARISON:  04/17/2018 FINDINGS: The patient is rotated to the right on today's radiograph, reducing diagnostic sensitivity and specificity. Moderate enlargement of the cardiopericardial silhouette, similar to prior. Atherosclerotic calcification of the aortic arch. Continued airspace opacity obscuring the right hemidiaphragm, primarily in the right lower lobe, with some volume loss and likely underlying moderate to large right pleural effusion. Mildly improved aeration at the left lung base. Blunted right and possibly left costophrenic angles. Prominent main pulmonary artery contour. Degenerative glenohumeral arthropathy bilaterally. IMPRESSION: 1. Roughly similar moderate to large right  pleural effusion with passive atelectasis. Small left pleural effusion. Improved aeration at the left lung base. 2. Moderate cardiomegaly. 3.  Aortic Atherosclerosis (ICD10-I70.0). 4. Enlarged pulmonary artery, potentially reflecting pulmonary arterial hypertension. Electronically Signed   By: Van Clines M.D.   On: 04/21/2018 11:47   Dg Chest Port 1 View  Result Date: 04/23/2018 CLINICAL DATA:  Shortness of breath EXAM: PORTABLE CHEST 1 VIEW COMPARISON:  Portable chest x-ray of 04/22/2018 and 04/21/2017 FINDINGS: There has been slight decrease in volume of the right pneumothorax. Aeration has decreased slightly. Bilateral pleural effusions remain with bibasilar volume loss right greater  than left. Cardiomegaly is stable. IMPRESSION: 1. Slight decrease in volume of small right apical pneumothorax. 2. No change in bilateral pleural effusions with bibasilar volume loss right greater than left. Stable cardiomegaly Electronically Signed   By: Ivar Drape M.D.   On: 04/23/2018 08:31   Ir Thoracentesis Asp Pleural Space W/img Guide  Result Date: 04/22/2018 INDICATION: Patient with history of heart failure, colon cancer, chronic kidney disease, dementia, malnutrition, recurrent right pleural effusion; request received for diagnostic and therapeutic right thoracentesis. EXAM: ULTRASOUND GUIDED DIAGNOSTIC AND THERAPEUTIC RIGHT THORACENTESIS MEDICATIONS: None COMPLICATIONS: SIR Level A - No therapy, no consequence. Procedure complicated by development of a small asymptomatic presumably ex vacuo right-sided pneumothorax PROCEDURE: An ultrasound guided thoracentesis was thoroughly discussed with the patient/family and questions answered. The benefits, risks, alternatives and complications were also discussed. The patient understands and wishes to proceed with the procedure. Written consent was obtained. Ultrasound was performed to localize and mark an adequate pocket of fluid in the right chest. The area was then  prepped and draped in the normal sterile fashion. 1% Lidocaine was used for local anesthesia. Under ultrasound guidance a 6 Fr Safe-T-Centesis catheter was introduced. Thoracentesis was performed. The catheter was removed and a dressing applied. FINDINGS: A total of approximately 620 cc of blood-tinged/amber fluid was removed. Samples were sent to the laboratory as requested by the clinical team. IMPRESSION: Successful ultrasound guided diagnostic and therapeutic right thoracentesis yielding 620 cc of pleural fluid. Read by: Rowe Robert, PA-C Electronically Signed   By: Sandi Mariscal M.D.   On: 04/22/2018 15:15   Telemetry    04/23/18 Atrial fibrillation HR 70's - Personally Reviewed  ECG    04/21/18: Atrial fibrillation HR 92 - Personally Reviewed  Cardiac Studies   Echocardiogram 03/18/2018 Study Conclusions - Left ventricle: The cavity size was normal. Wall thickness was increased in a pattern of moderate LVH. Systolic function was normal. The estimated ejection fraction was in the range of 60% to 65%. Wall motion was normal; there were no regional wall motion abnormalities. The study was not technically sufficient to allow evaluation of LV diastolic dysfunction due to atrial fibrillation. - Aortic valve: Trileaflet; mildly calcified leaflets. There was no stenosis. There was moderate regurgitation. - Aorta: Mildly dilated aortic root and ascending aorta. Ascending aorta dimension: 40 mm. Aortic root dimension: 37 mm (ED). - Mitral valve: Mildly calcified annulus. Mildly calcified leaflets . There was mild regurgitation. - Left atrium: The atrium was moderately dilated. - Right ventricle: The cavity size was mildly to moderately dilated. Systolic function was normal. - Right atrium: The atrium was severely dilated. - Tricuspid valve: Peak RV-RA gradient (S): 54 mm Hg. - Pulmonary arteries: PA peak pressure: 62 mm Hg (S). - Systemic veins: IVC measured 1.9 cm with  < 50% respirophasic variation, suggesting RA pressure 8 mmHg.  Impressions: - The patient was in atrial fibrillation. Normal LV size with moderate LV hypertrophy. EF 60-65%. Mild to moderate RV dilation with normal systolic function. Mild MR, moderate aortic insufficiency. Moderate pulmonary hypertension. Biatrial enlargement.  Patient Profile     84 y.o. female admitted with acute on chronic diastolic heart failure with bilateral pleural effusions, failure to thrive, dementia, unable to anticoagulate atrial fibrillation due to intracranial hemorrhage, thoracic aortic aneurysm, recent stroke here with shortness of breath consistent with acute diastolic heart failure. Last echocardiogram August 2019 showed normal LV function, moderate aortic insufficiency, mildly dilated aortic root, mild mitral regurgitation, biatrial enlargement, mild to moderate right ventricular enlargement.  Assessment & Plan    1. Acute on chronic diastolic CHF with known pleural effusions: -Will transition to PO Lasix 40mg  twice daily and monitor  -Pt underwent thoracentesis on 04/22/18 with 69ml off>>post procedure CXR with small ex vacuo pneumothorax which was resolving per imaging on 04/23/18 per Radiology PA -Weight, 117lb today, 124lb on admission  -I&O, net negative 2.6L since admission  -Continue current regimen for now and will discuss with MD   2. Permanent atrial fibrillation: -Stable, rate controlled in the 70-80's  -Continue diltiazem and Toprol XL for rate control   3. Hypertension: -Stable, 146/82>142/84>137/92 -Continue diltiazem 30, hydralazine 25, Imdur 30, Toprol XL   4. Acute on chronic CKD stage III: -Creatinine, 1.44 today>>down from 1.45 yesterday  -Baseline appears to be in the 1.2-1.4 range  -Continue to avoid nephrotoxic medications   5. Dementia: -Stable, per primary team -Continue Aricept   Signed, Kathyrn Drown NP-C [facility] Pager: [phone] 04/23/2018,  11:19 AM     For questions or updates, please contact   Please consult [email or web address] for contact info under Cardiology/STEMI.  ----------------------------------------------------------------------------------   History and all data above reviewed.  Patient examined.  I agree with the findings as above.  ZANARIA MORELL is sleeping on exam today.  I had a chance to speak to her family friend who informed me that the patient is never alone at home and has help with activities of daily living.  Her pneumothorax status post thoracentesis appears stable on chest x-ray and perhaps mildly improved.  When I spoke to her yesterday evening she said she felt better after thoracentesis.  MWU:XLKG, elderly femaleNo acute distress.  Neck:MinimalJVD Cardiac:irregularly irregular rhythm, normal rate, no murmurs. Respiratory:bilbasilar decreased breath sounds MW:NUUV, nontender, non-distended  MS:No edema; No deformity. Neuro: sleeping for exam Psych: sleeping.   All available labs, radiology testing, previous records reviewed. Agree with documented assessment and plan of my colleague as stated above with the following additions or changes:  Principal Problem:   Acute respiratory distress Active Problems:   History of colon cancer, stage III   Diabetic nephropathy (HCC)   Hypertensive urgency   Type 2 diabetes mellitus with peripheral neuropathy (HCC)   Essential hypertension   General weakness   GERD (gastroesophageal reflux disease)   Acute diastolic CHF (congestive heart failure), NYHA class 3 (HCC)   Goals of care, counseling/discussion   Encounter for hospice care discussion   Palliative care encounter   Plan: The patient's volume status has improved, and we will transition to oral furosemide. We will arrange for cardiology follow up in 2-4 weeks, and would recommend PCP follow up in 1 week of hospital dismissal. Agree with palliative care involvement for goals of care and  symptom management.    [facility] will sign off.   Medication Recommendations:  - lasix 40 mg BID today, then can dismiss on 40 mg daily - no other changes to CV medications, however, consider simplification of medical therapy in setting of goals of care discussions.  Other recommendations (labs, testing, etc):  Creatinine in 1 week with PCP follow up.  Follow up as an outpatient:  Cardiology in 2-4 weeks.  Elouise Munroe, MD [facility] 12:25 PM  04/23/2018

## 2018-04-24 ENCOUNTER — Encounter: Payer: Medicare HMO | Admitting: Internal Medicine

## 2018-04-24 ENCOUNTER — Inpatient Hospital Stay (HOSPITAL_COMMUNITY): Payer: Medicare HMO

## 2018-04-24 LAB — BASIC METABOLIC PANEL
ANION GAP: 7 (ref 5–15)
BUN: 28 mg/dL — ABNORMAL HIGH (ref 8–23)
CALCIUM: 8.3 mg/dL — AB (ref 8.9–10.3)
CO2: 37 mmol/L — ABNORMAL HIGH (ref 22–32)
Chloride: 95 mmol/L — ABNORMAL LOW (ref 98–111)
Creatinine, Ser: 1.28 mg/dL — ABNORMAL HIGH (ref 0.44–1.00)
GFR calc Af Amer: 43 mL/min — ABNORMAL LOW (ref >60)
GFR, EST NON AFRICAN AMERICAN: 37 mL/min — AB (ref >60)
GLUCOSE: 83 mg/dL (ref 70–99)
Potassium: 3.4 mmol/L — ABNORMAL LOW (ref 3.5–5.1)
Sodium: 139 mmol/L (ref 135–145)

## 2018-04-24 LAB — GLUCOSE, CAPILLARY
GLUCOSE-CAPILLARY: 162 mg/dL — AB (ref 70–99)
Glucose-Capillary: 100 mg/dL — ABNORMAL HIGH (ref 70–99)
Glucose-Capillary: 113 mg/dL — ABNORMAL HIGH (ref 70–99)
Glucose-Capillary: 90 mg/dL (ref 70–99)

## 2018-04-24 MED ORDER — POTASSIUM CHLORIDE CRYS ER 20 MEQ PO TBCR
40.0000 meq | EXTENDED_RELEASE_TABLET | Freq: Once | ORAL | Status: AC
  Administered 2018-04-24: 40 meq via ORAL
  Filled 2018-04-24: qty 2

## 2018-04-24 MED ORDER — ADULT MULTIVITAMIN W/MINERALS CH
1.0000 | ORAL_TABLET | Freq: Every day | ORAL | Status: DC
  Administered 2018-04-24 – 2018-04-25 (×2): 1 via ORAL
  Filled 2018-04-24 (×2): qty 1

## 2018-04-24 NOTE — Telephone Encounter (Signed)
I spoke with Nicole [name] and she verbalized understanding.

## 2018-04-24 NOTE — Progress Notes (Signed)
Palliative:  Nicole [name] is sitting up in recliner. She appears less SOB today and is feeding herself lunch. Appears also to be eating better today. No need for opioid at this time. No family or visitors at bedside. Noted plan for home with hospice tomorrow and hoping hospice can continue conversation with daughter/HCPOA Peter Congo in person. I will continue to follow while she is hospitalized.   No charge  Vinie Sill, NP Palliative Medicine Team Pager # [phone] (M-F 8a-5p) Team Phone # [phone] (Nights/Weekends)

## 2018-04-24 NOTE — Progress Notes (Signed)
Patient ID: Nicole [name], female   DOB: Mar 20, 1931, 84 y.o.   MRN: [id number]  I have examined the CXR today and the patient at the bedside. She is in no respiratory distress and feels much better. The Chest X-ray shows no significant change over the last three days. The residual pneumothorax is likely due failure of the lung to re-expand fully. The pneumothorax should eventually re accumulate with fluid. She can be discharged without further follow up with interventional radiology unless she developers respiratory compromise.

## 2018-04-24 NOTE — Progress Notes (Signed)
[facility])  Notified by RN CM (on 10/2)  of family request for [facility] services at home after discharge.  Chart and patient reviewed by [facility] physician, and  hospice eligibility has been confirmed.  Spoke with bedside RN, she was unsure if pt would be discharged today.  Spoke with [name], significant other, he confirms she will be coming home tomorrow.  Left msg for RN CM on # listed in amion.  [name] confirms there are no current DME needs that they do not already have.  DME needs have been discussed, and pt currently has the following at home:  [place], O2 @ 3lpm [facility]).  Family denies any additional equipment at this time  Please send signed and completed DNR form home with patient at discharge.  Patient will need prescriptions for discharge comfort medications.  Completed discharge summary will need to be faxed to [phone] when final.    Please notify [facility] when pt is ready to leave the unit so we can arrange for a RN visit at home, [phone].  Please call with any hospice related questions.  Thank you for this referral.  Venia Carbon BSN, RN  [facility] Liaison  (listed in [place]) [phone]

## 2018-04-24 NOTE — Progress Notes (Signed)
Occupational Therapy Treatment Patient Details Name: Nicole [name] MRN: [id number] DOB: [birth year] Today's Date: 04/24/2018    History of present illness Pt is an 84 year old female admitted with SOB. PMHx: dementia, atrial fibrillation, CHF, CKD, T2DM, chronic respiratory failure on 3L home O2, recent CVA with left weakness   OT comments  Pt with lethargy and complaints of generalized pain. Attempted multimodal stimulation to increase level of arousal in order to eat breakfast without success. RN aware.  Follow Up Recommendations  Supervision/Assistance - 24 hour(HCPOA has elected home with hospice care)    Equipment Recommendations  None recommended by OT    Recommendations for Other Services      Precautions / Restrictions Precautions Precautions: Fall Restrictions Weight Bearing Restrictions: No       Mobility Bed Mobility               General bed mobility comments: +2 total to pull up in bed, pt moaning and reporting pain, but not specific.  Transfers                      Balance                                           ADL either performed or assessed with clinical judgement   ADL Overall ADL's : Needs assistance/impaired Eating/Feeding: Total assistance;Bed level Eating/Feeding Details (indicate cue type and reason): attempted, pt not interested in eating or drinking, lethargic Grooming: Wash/dry hands;Bed level;Total assistance Grooming Details (indicate cue type and reason): attempted to rouse pt with cold cloth to face                                     Vision       Perception     Praxis      Cognition Arousal/Alertness: Lethargic Behavior During Therapy: Flat affect Overall Cognitive Status: History of cognitive impairments - at baseline                                 General Comments: opened windows, made room cooler, repositioned pt in bed, used cold cloth, pt continuing to  keep eyes closed        Exercises     Shoulder Instructions       General Comments      Pertinent Vitals/ Pain       Pain Assessment: Faces Faces Pain Scale: Hurts even more Pain Location: generalized Pain Descriptors / Indicators: Moaning Pain Intervention(s): Monitored during session;Repositioned  Home Living                                          Prior Functioning/Environment              Frequency  Min 2X/week        Progress Toward Goals  OT Goals(current goals can now be found in the care plan section)  Progress towards OT goals: Not progressing toward goals - comment(lethargic)  Acute Rehab OT Goals Patient Stated Goal: rest, get warmer  OT Goal Formulation: Patient unable to participate in goal setting Time For Goal Achievement: 05/02/18  Potential to Achieve Goals: [place] Discharge plan remains appropriate    Co-evaluation                 AM-PAC PT "6 Clicks" Daily Activity     Outcome Measure   Help from another person eating meals?: Total Help from another person taking care of personal grooming?: Total Help from another person toileting, which includes using toliet, bedpan, or urinal?: Total Help from another person bathing (including washing, rinsing, drying)?: Total Help from another person to put on and taking off regular upper body clothing?: Total Help from another person to put on and taking off regular lower body clothing?: Total 6 Click Score: 6    End of Session    OT Visit Diagnosis: Unsteadiness on feet (R26.81);Other abnormalities of gait and mobility (R26.89);Muscle weakness (generalized) (M62.81);Other symptoms and signs involving cognitive function   Activity Tolerance     Patient Left in bed;with call bell/phone within reach;with bed alarm set;with nursing/sitter in room   Nurse Communication (aware of lethargy)        Time: 3086-5784 OT Time Calculation (min): 13 min  Charges: OT  General Charges $OT Visit: 1 Visit OT Treatments $Self Care/Home Management : 8-22 mins  Nestor Lewandowsky, OTR/L Acute Rehabilitation Services Pager: [phone] Office: [phone]   Malka So 04/24/2018, 10:02 AM

## 2018-04-24 NOTE — Progress Notes (Addendum)
Physical Therapy Treatment Patient Details Name: Nicole [name] MRN: [id number] DOB: 1931/02/22 Today's Date: 04/24/2018    History of Present Illness Pt is an 84 year old female admitted with SOB. PMHx: dementia, atrial fibrillation, CHF, CKD, T2DM, chronic respiratory failure on 3L home O2, recent CVA with left weakness    PT Comments    Pt sleeping in bed on arrival. Able to arouse to name and speaking with eyes closed stating she is cold. Pt noted to be lying in urine soaked linens and educated pt for need to move and perform pericare and linen change. Pt able to assist with after assist to  Initiate transfer. Pt with noted accessory muscle use and increased WOB today compared to last session with this therapist. Pt able to transition to chair with caregiver present and rad tech present for xray. Will continue to follow as pt able. Pt on 3L throughout session.    Follow Up Recommendations  Supervision/Assistance - 24 hour;Other (comment);Home health PT(family has decided on home with hospice)     Equipment Recommendations  None recommended by PT    Recommendations for Other Services       Precautions / Restrictions Precautions Precautions: Fall Restrictions Weight Bearing Restrictions: No    Mobility  Bed Mobility Overal bed mobility: Needs Assistance Bed Mobility: Supine to Sit     Supine to sit: Mod assist;HOB elevated     General bed mobility comments: mod assist to transition supine to sit with increased time and cues  Transfers Overall transfer level: Needs assistance   Transfers: Sit to/from Stand;Stand Pivot Transfers Sit to Stand: Min assist Stand pivot transfers: Min assist       General transfer comment: min assist to stand from bed x 2 trials with cues for anterior translation and assist to rise. Pivot from bed to chair with RW  with min assist  Ambulation/Gait             General Gait Details: deferred this session due to fatigue and  WOB   Stairs             Wheelchair Mobility    Modified Rankin (Stroke Patients Only)       Balance Overall balance assessment: Needs assistance   Sitting balance-Leahy Scale: Fair Sitting balance - Comments: pt able to sit EOB without UE assist    Standing balance support: Bilateral upper extremity supported Standing balance-Leahy Scale: Poor                              Cognition Arousal/Alertness: Lethargic Behavior During Therapy: Flat affect Overall Cognitive Status: History of cognitive impairments - at baseline                                 General Comments: opened windows, made room cooler, repositioned pt in bed, used cold cloth, pt continuing to keep eyes closed      Exercises      General Comments        Pertinent Vitals/Pain Pain Assessment: No/denies pain Faces Pain Scale: Hurts even more Pain Location: generalized Pain Descriptors / Indicators: Moaning Pain Intervention(s): Monitored during session;Repositioned    Home Living                      Prior Function            PT Goals (  current goals can now be found in the care plan section) Acute Rehab PT Goals Patient Stated Goal: rest, get warmer  Progress towards PT goals: Progressing toward goals    Frequency    Min 2X/week      PT Plan Frequency needs to be updated;Discharge plan needs to be updated    Co-evaluation              AM-PAC PT "6 Clicks" Daily Activity  Outcome Measure  Difficulty turning over in bed (including adjusting bedclothes, sheets and blankets)?: Unable Difficulty moving from lying on back to sitting on the side of the bed? : Unable Difficulty sitting down on and standing up from a chair with arms (e.g., wheelchair, bedside commode, etc,.)?: Unable Help needed moving to and from a bed to chair (including a wheelchair)?: A Lot Help needed walking in hospital room?: A Lot Help needed climbing 3-5 steps with  a railing? : Total 6 Click Score: 8    End of Session Equipment Utilized During Treatment: Oxygen;Gait belt Activity Tolerance: Patient limited by fatigue Patient left: in chair;with call bell/phone within reach;with chair alarm set;with family/visitor present Nurse Communication: Mobility status PT Visit Diagnosis: Unsteadiness on feet (R26.81);Muscle weakness (generalized) (M62.81);History of falling (Z91.81)     Time: 5284-1324 PT Time Calculation (min) (ACUTE ONLY): 22 min  Charges:  $Therapeutic Activity: 8-22 mins                     [place], PT Acute Rehabilitation Services Pager: [phone] Office: [phone]    Dorn Hartshorne B Khaza Blansett 04/24/2018, 11:17 AM

## 2018-04-24 NOTE — Progress Notes (Signed)
Nutrition Follow-up  DOCUMENTATION CODES:   Severe malnutrition in context of chronic illness  INTERVENTION:   -Continue Glucerna Shake po BID, each supplement provides 220 kcal and 10 grams of protein -MVI with minerals daily   NUTRITION DIAGNOSIS:   Severe Malnutrition related to chronic illness(dementia/CHF) as evidenced by moderate fat depletion, severe muscle depletion, percent weight loss, energy intake < or equal to 75% for > or equal to 1 month.  Ongoing  GOAL:   Patient will meet greater than or equal to 90% of their needs  Progressing  MONITOR:   PO intake, Supplement acceptance, Labs, Weight trends, I & O's  REASON FOR ASSESSMENT:   Consult Assessment of nutrition requirement/status  ASSESSMENT:   Patient with PMH significant for chronic hypoxia on 3 L nasal cannula at home, diastolic CHF, chronic atrial fibrillation, dementia, CKD stage III, DM, CVA, essential HTN. Presents this admission with complaints of generalized weakness and shortness of breath. Admitted for acute exacerbation of CHF and mild AKI.   10/1- s/p rt thoracentesis- 620 ml fluid removed  Reviewed I/O's: -320 ml x 24 hours and -3.2 L since admission  Pt sitting in recliner chair at time of visit, in good spirits and smiling. Pt reports her appetite has improved over the week. Noted meal completion 25-100%. Pt has been enjoying her meals, stating she ate "almost all" of her breakfast and lunch. Observed multiple items beside patient, including half eaten applesauce, half drank tea and juice. Noted pt consumed 75% and 50% of Glucerna supplements today. Pt is enjoying these supplements ("they taste pretty good to me"). Discussed importance of good meal and supplement intake to promote healing.   Palliative care team following; pt will likely transition home with hospice vs comfort care if respiratory status worsens.   Last Hgb A1c: 5.1 (03/19/18). PTA DM medications are none.   Labs reviewed: K:  3.4, CBGS: 100-269 (inpatient orders for glycemic control are none).   Diet Order:   Diet Order            Diet Heart Room service appropriate? Yes; Fluid consistency: Thin; Fluid restriction: 1500 mL Fluid  Diet effective now              EDUCATION NEEDS:   Education needs have been addressed  Skin:  Skin Assessment: Reviewed RN Assessment  Last BM:  PTA  Height:   Ht Readings from Last 1 Encounters:  04/17/18 5\' 2"  (1.575 m)    Weight:   Wt Readings from Last 1 Encounters:  04/23/18 53.2 kg    Ideal Body Weight:  50 kg  BMI:  Body mass index is 21.45 kg/m.  Estimated Nutritional Needs:   Kcal:  1600-1800 kcal  Protein:  80-95 grams  Fluid:  1500 ml fluid restriction    Shaniyah Wix A. Jimmye Norman, RD, LDN, CDE Pager: [phone] After hours Pager: [phone]

## 2018-04-24 NOTE — Progress Notes (Signed)
PROGRESS NOTE   Nicole [name]  IEP:[id number]    DOB: 05-10-1931    DOA: 04/17/2018  PCP: [name], DO   I have briefly reviewed patients previous medical records in [facility].  Brief Narrative:  84 year old female, PMH of chronic respiratory failure with hypoxia on 3 L/min oxygen via nasal cannula, chronic diastolic CHF, chronic A. fib, dementia, stage III CKD, DM 2, HTN, HLD CVA, hypothyroid, recently hospitalized 03/17/2018-03/26/2018 for acute CVA complicated by seizures and acute respiratory failure with hypoxia, acute on chronic diastolic CHF, bilateral pleural effusions, left > right for which he underwent 1.3 L thoracentesis, acute on chronic kidney disease, stabilized and discharged to [facility] where she stayed for about 3 weeks and went home 2 days PTA, continued to feel very weak, unable to get out of bed much and presented to ED 9/26 with generalized weakness and progressive dyspnea.  Of note patient was taking Lasix only prescribed as needed.  In ED, SBP 194 and clinical picture consistent with decompensated CHF.   Assessment & Plan:   Principal Problem:   Acute respiratory distress Active Problems:   History of colon cancer, stage III   Diabetic nephropathy (HCC)   Hypertensive urgency   Type 2 diabetes mellitus with peripheral neuropathy (HCC)   Essential hypertension   General weakness   GERD (gastroesophageal reflux disease)   Acute diastolic CHF (congestive heart failure), NYHA class 3 (HCC)   Goals of care, counseling/discussion   Encounter for hospice care discussion   Palliative care encounter   Acute on chronic diastolic CHF: Cardiology consulted and assisted with care.  Treated initially with IV Lasix 40 mg 3 times daily.  -3.045 L since admission.  Weight down by about 7 pounds since admission.  Clinically euvolemic and has been transitioned to oral Lasix 40 mg twice daily.  Cardiology signed off and will arrange outpatient  follow-up.  Right pleural effusion status post thoracentesis/pneumothorax: Underwent ultrasound-guided thoracentesis by IR on 10/1 yielding 620 mL fluid.  She developed small presumably ex vacuo pneumothorax postprocedure which on follow-up chest x-ray had reduced on 10/2 but slightly larger per CXR report 10/3 although no worsening in her respiratory status. I discussed with Dr. Maryclare Bean who saw patient and reviewed CXR and recommends that the pneumothorax is stable and does not need any further follow-up unless she becomes symptomatically worse.  Acute on chronic respiratory failure with hypoxia: Secondary to decompensated CHF and pleural effusion.  Treat underlying cause and titrate oxygen as needed.  Palliative care team input appreciated, recommend considering low-dose opioid for dyspnea relief and they will follow up tomorrow and with family-follow-up pending.  Permanent atrial fibrillation: Rate controlled on Cardizem and Toprol-XL.  Not a candidate for anticoagulation due to recent intracranial bleed.  Hypertensive urgency/essential hypertension: Controlled.  Remains on Cardizem, hydralazine, Imdur and metoprolol.  Acute on stage III chronic kidney disease: Baseline creatinine 1.2-1.4.  Creatinine had peaked to 1.7 this admission.  Creatinine has improved to 1.28.  Dementia: As per caregiver, progressive worsening mental status since her discharge in August to SNF.  Worsened mental status changes may be related to recent intracranial bleed.  Palliative care consulted for goals of care.  Continue Aricept.  DM 2 with hypoglycemia: Insulins held.  Hypoglycemia resolved.  CBGs at times mildly uncontrolled and fluctuating.  No change in treatment.  Hypothyroid: Synthroid.  Severe protein calorie malnutrition:  Intracranial hemorrhage 12/26/628 complicated by seizures: Continue Vimpat for seizure prevention.  Hypokalemia and  hypomagnesemia: Replaced.  Vitamin D deficiency: Replace and  follow.  Normocytic anemia: Stable.  Adult failure to thrive: Multifactorial secondary to advanced age, dementia, multiple severe significant comorbidities and recent acute illnesses.  Palliative care consulting for goals of care and likely DC home with hospice.  I discussed in detail with patient's daughter and sister who is a Therapist, sports, updated care and regarding her overall poor prognosis.  They verbalized understanding.   DVT prophylaxis: SCDs Code Status: DNR Family Communication: Discussed with patient's caregiver at bedside and then with daughter and sister over phone.  Updated care and answered questions. Disposition: Monitor overnight, await palliative care team follow-up, possible discharge home with hospice versus?  Residential hospice appropriate- can check with palliative team, DC 10/4.   Consultants:  Cardiology Palliative medicine Interventional radiology  Procedures:  Right therapeutic thoracentesis 10/1  Antimicrobials:  None   Subjective: Patient was slightly sleepy this morning when PT was working with her but subsequently became alert.  Poor historian.  No pain or dyspnea reported.  Appeared slightly dyspneic with activity this morning.  ROS: As above, otherwise unable due to mental status change.  Objective:  Vitals:   04/24/18 0622 04/24/18 0801 04/24/18 1122 04/24/18 1537  BP: 118/81 135/76 105/66 116/73  Pulse: 65 67 65 73  Resp: 18 18 18 20   Temp: 97.7 F (36.5 C) 97.9 F (36.6 C) 97.8 F (36.6 C) 98.3 F (36.8 C)  TempSrc: Oral Oral Oral Oral  SpO2: 100% 100% 100% 100%  Weight:      Height:        Examination:  General exam: Pleasant elderly female, moderately built and frail, sitting up comfortably in bed with assistance from PT.  Mild dyspneic with activity. Respiratory system: Slightly harsh and diminished breath sounds bilaterally but no wheezing, rhonchi or crackles appreciated. Respiratory effort normal. Cardiovascular system: S1 & S2  heard, RRR. No JVD, murmurs, rubs, gallops or clicks. No pedal edema.  Telemetry personally reviewed: Atrial flutter with variable but controlled ventricular rate. Gastrointestinal system: Abdomen is nondistended, soft and nontender. No organomegaly or masses felt. Normal bowel sounds heard. Central nervous system: Slightly sleepy but easily arousable and not oriented. No focal neurological deficits. Extremities: Symmetric 5 x 5 power. Skin: No rashes, lesions or ulcers Psychiatry: Judgement and insight impaired. Mood & affect appropriate.     Data Reviewed: I have personally reviewed following labs and imaging studies  CBC: Recent Labs  Lab 04/18/18 0513 04/20/18 0511 04/22/18 0752  WBC 8.0 6.7 6.3  HGB 10.4* 12.1 11.5*  HCT 30.2* 35.1* 33.7*  MCV 78.9 79.6 79.3  PLT 211 226 749   Basic Metabolic Panel: Recent Labs  Lab 04/18/18 0513  04/20/18 0511 04/21/18 0606 04/22/18 0752 04/23/18 0427 04/24/18 0600  NA 145   < > 139 139 137 136 139  K 3.2*   < > 4.1 3.7 3.7 3.5 3.4*  CL 105   < > 96* 95* 96* 96* 95*  CO2 31   < > 33* 36* 36* 36* 37*  GLUCOSE 102*   < > 94 131* 84 94 83  BUN 26*   < > 18 27* 28* 32* 28*  CREATININE 1.55*   < > 1.26* 1.45* 1.45* 1.44* 1.28*  CALCIUM 8.3*   < > 8.5* 8.7* 8.3* 8.0* 8.3*  MG 1.4*  --  1.4* 1.9  --  1.9  --    < > = values in this interval not displayed.   Liver Function Tests:  Recent Labs  Lab 04/18/18 0513  AST 26  ALT 23  ALKPHOS 106  BILITOT 0.8  PROT 5.7*  ALBUMIN 2.5*   Coagulation Profile: Recent Labs  Lab 04/18/18 0513  INR 1.38   CBG: Recent Labs  Lab 04/23/18 1301 04/23/18 1648 04/23/18 2128 04/24/18 0744 04/24/18 1133  GLUCAP 114* 130* 269* 100* 113*    Recent Results (from the past 240 hour(s))  Gram stain     Status: None   Collection Time: 04/22/18  3:28 PM  Result Value Ref Range Status   Specimen Description PLEURAL  Final   Special Requests NONE  Final   Gram Stain   Final    WBC PRESENT,  PREDOMINANTLY MONONUCLEAR NO ORGANISMS SEEN CYTOSPIN SMEAR Performed at [facility], [place] [address], [place], [place] [address]    Report Status 04/22/2018 FINAL  Final  Culture, body fluid-bottle     Status: None (Preliminary result)   Collection Time: 04/22/18  3:28 PM  Result Value Ref Range Status   Specimen Description PERITONEAL  Final   Special Requests NONE  Final   Culture   Final    NO GROWTH 2 DAYS Performed at [facility] [address] [place], [place] [address]    Report Status PENDING  Incomplete         Radiology Studies: Dg Chest Port 1 View  Result Date: 04/24/2018 CLINICAL DATA:  Short of breath.  Pneumothorax. EXAM: PORTABLE CHEST 1 VIEW COMPARISON:  04/23/2018 FINDINGS: Progression of right apical pneumothorax now approximately 2.5 cm. Progression of right pleural effusion and right lower lobe atelectasis Small left effusion and mild left lower lobe atelectasis. Cardiac enlargement with mild vascular congestion. Negative for edema. Atherosclerotic aortic arch. IMPRESSION: Progressive right pneumothorax. Mild progression of right lower lobe atelectasis and effusion. Electronically Signed   By: Franchot Gallo M.D.   On: 04/24/2018 10:36   Dg Chest Port 1 View  Result Date: 04/23/2018 CLINICAL DATA:  Shortness of breath EXAM: PORTABLE CHEST 1 VIEW COMPARISON:  Portable chest x-ray of 04/22/2018 and 04/21/2017 FINDINGS: There has been slight decrease in volume of the right pneumothorax. Aeration has decreased slightly. Bilateral pleural effusions remain with bibasilar volume loss right greater than left. Cardiomegaly is stable. IMPRESSION: 1. Slight decrease in volume of small right apical pneumothorax. 2. No change in bilateral pleural effusions with bibasilar volume loss right greater than left. Stable cardiomegaly Electronically Signed   By: Ivar Drape M.D.   On: 04/23/2018 08:31        Scheduled Meds: . diltiazem  30 mg Oral BID  . donepezil   10 mg Oral QHS  . feeding supplement (GLUCERNA SHAKE)  237 mL Oral BID BM  . ferrous sulfate  325 mg Oral BID WC  . furosemide  40 mg Oral BID  . hydrALAZINE  25 mg Oral Q8H  . isosorbide mononitrate  30 mg Oral BID  . lacosamide  75 mg Oral BID  . levothyroxine  25 mcg Oral QAC breakfast  . magnesium oxide  200 mg Oral BID  . metoprolol succinate  50 mg Oral Daily  . multivitamin with minerals  1 tablet Oral Daily  . senna-docusate  1 tablet Oral Daily  . sertraline  50 mg Oral Daily  . vitamin B-12  100 mcg Oral Daily  . Vitamin D (Ergocalciferol)  50,000 Units Oral Q7 days   Continuous Infusions:   LOS: 7 days     Vernell Leep, MD,  FACP, FHM. [facility] Pager [phone]  If 7PM-7AM, please contact night-coverage [email or web address] Password [facility] 04/24/2018, 4:39 PM

## 2018-04-24 NOTE — Progress Notes (Addendum)
Patient reported that she is missing a ring. No ring noted on the patients hand this morning however admission history/belonggins admission was checked,no ring was documented on admission.  Checked patient chart to see its there is any documentation about ring being locked while patient was in ED, no documentation. Advised husband to look around the house to make sure that ring is not in her house. Significant other stated that daughter might have patients ring, she will check it.  Therma Lasure, RN

## 2018-04-24 NOTE — Progress Notes (Signed)
[facility]eli D/C per order. Requested med-surge bed per order.  Scarleth Brame, RN

## 2018-04-25 DIAGNOSIS — R627 Adult failure to thrive: Secondary | ICD-10-CM

## 2018-04-25 DIAGNOSIS — J939 Pneumothorax, unspecified: Secondary | ICD-10-CM

## 2018-04-25 DIAGNOSIS — I509 Heart failure, unspecified: Secondary | ICD-10-CM

## 2018-04-25 DIAGNOSIS — I10 Essential (primary) hypertension: Secondary | ICD-10-CM

## 2018-04-25 LAB — GLUCOSE, CAPILLARY
Glucose-Capillary: 89 mg/dL (ref 70–99)
Glucose-Capillary: 208 mg/dL — ABNORMAL HIGH (ref 70–99)

## 2018-04-25 LAB — BASIC METABOLIC PANEL
ANION GAP: 5 (ref 5–15)
BUN: 32 mg/dL — ABNORMAL HIGH (ref 8–23)
CALCIUM: 8.4 mg/dL — AB (ref 8.9–10.3)
CHLORIDE: 96 mmol/L — AB (ref 98–111)
CO2: 36 mmol/L — ABNORMAL HIGH (ref 22–32)
Creatinine, Ser: 1.3 mg/dL — ABNORMAL HIGH (ref 0.44–1.00)
GFR calc Af Amer: 42 mL/min — ABNORMAL LOW (ref >60)
GFR, EST NON AFRICAN AMERICAN: 36 mL/min — AB (ref >60)
Glucose, Bld: 99 mg/dL (ref 70–99)
POTASSIUM: 4.3 mmol/L (ref 3.5–5.1)
SODIUM: 137 mmol/L (ref 135–145)

## 2018-04-25 MED ORDER — VITAMIN D 1000 UNITS PO TABS: 1000.0000 [IU] | ORAL_TABLET | Freq: Every day | ORAL | 0 refills | Status: AC

## 2018-04-25 MED ORDER — ALLOPURINOL 100 MG PO TABS: 100.0000 mg | ORAL_TABLET | Freq: Every day | ORAL | Status: AC

## 2018-04-25 MED ORDER — HYDRALAZINE HCL 25 MG PO TABS: 25.0000 mg | ORAL_TABLET | Freq: Three times a day (TID) | ORAL | 0 refills | Status: AC

## 2018-04-25 MED ORDER — METOPROLOL SUCCINATE ER 25 MG PO TB24: 50.0000 mg | ORAL_TABLET | Freq: Every day | ORAL | 0 refills | Status: AC

## 2018-04-25 MED ORDER — FUROSEMIDE 40 MG PO TABS: 40.0000 mg | ORAL_TABLET | Freq: Every day | ORAL | 0 refills | Status: AC

## 2018-04-25 MED ORDER — ADULT MULTIVITAMIN W/MINERALS CH: 1.0000 | ORAL_TABLET | Freq: Every day | ORAL | Status: AC

## 2018-04-25 MED ORDER — FUROSEMIDE 40 MG PO TABS: 40.0000 mg | ORAL_TABLET | Freq: Two times a day (BID) | ORAL | 0 refills | Status: DC

## 2018-04-25 MED ORDER — GLUCERNA SHAKE PO LIQD: 237.0000 mL | Freq: Two times a day (BID) | ORAL | Status: AC

## 2018-04-25 NOTE — Progress Notes (Signed)
[facility] ([facility])  Pt will discharge with SO later today.  [facility] has a Therapist, sports that can come visit today at 630.  Attempted numbers listed for [facility] and [facility] to let them know of this potential time.  Venia Carbon BSN, [facility] Liaison [phone]

## 2018-04-25 NOTE — Progress Notes (Addendum)
Patient discharged: Home with significant other Rushie Goltz with home O2. Significant other notified family and daughters that patient is coming home. DNR form sent per request with family.   Via: Wheelchair   Discharge paperwork given: to patient and family  Reviewed with teach back  IV D/c  Belongings given to patient  Per case [facility] patient is ready, has everything ready and set up at home.   Jadien Lehigh, RN

## 2018-04-25 NOTE — Progress Notes (Signed)
Palliative:  Ms. Wadsworth appears improved today and fed herself breakfast and ate all her cereal, SOB is improved and she is getting up to [facility] with minimal assist. She is in good spirits. Family asking about hospice facility and I spoke with both daughter/HCPOA Peter Congo as well as patient's sister (retired Therapist, sports) and I explained that she is not yet eligible for hospice facility. Although we know that this can change at any time. They are planning home with [facility] and I encouraged them to work with hospice and hospice will help with transition to [facility] when the time is right. They understand and are expected discharge home with hospice scheduled for today.   Exam: Alert, confused. SOB much improved. No distress.   25 min  Vinie Sill, NP Palliative Medicine Team Pager # [phone] (M-F 8a-5p) Team Phone # [phone] (Nights/Weekends)

## 2018-04-25 NOTE — Progress Notes (Signed)
Faxed dc summary to [facility] to make aware of dc home today. Jonnie Finner RN CCM Case Mgmt phone [phone]

## 2018-04-25 NOTE — Discharge Summary (Signed)
Physician Discharge Summary  Nicole [name] FFM:[id number] DOB: 19-Nov-1930  PCP: Nicole Curry, DO  Admit date: 04/17/2018 Discharge date: 04/25/2018  Recommendations for Outpatient Follow-up:  1. Dr. Hollace Kinnier, PCP/hospice MD.  May consider repeating labs (CBC & BMP) in a week depending on how aggressive family wish to pursue. 2. Nicole Deforest, PA/Cardiology on 05/05/2018 at 9:30 AM.  Home Health: Patient is being discharged with home hospice services. Equipment/Devices: Patient is chronically on home oxygen at 3 L/min.  Discharge Condition: Improved and stable.  However overall prognosis is poor and at high risk for continued decline. CODE STATUS: DNR. Diet recommendation: Heart healthy diet.  Discharge Diagnoses:  Principal Problem:   Acute respiratory distress Active Problems:   History of colon cancer, stage III   Diabetic nephropathy (HCC)   Hypertensive urgency   Type 2 diabetes mellitus with peripheral neuropathy (HCC)   Essential hypertension   General weakness   GERD (gastroesophageal reflux disease)   Acute diastolic CHF (congestive heart failure), NYHA class 3 (HCC)   Goals of care, counseling/discussion   Encounter for hospice care discussion   Palliative care encounter   Brief Summary: 84 year old female, PMH of chronic respiratory failure with hypoxia on 3 L/min oxygen via nasal cannula, chronic diastolic CHF, chronic A. fib, dementia, stage III CKD, DM 2, HTN, HLD CVA, hypothyroid, recently hospitalized 03/17/2018-03/26/2018 for acute CVA complicated by seizures and acute respiratory failure with hypoxia, acute on chronic diastolic CHF, bilateral pleural effusions, left > right for which he underwent 1.3 L thoracentesis, acute on chronic kidney disease, stabilized and discharged to [facility] where she stayed for about 3 weeks and went home 2 days PTA, continued to feel very weak, unable to get out of bed much and presented to ED 9/26 with generalized weakness  and progressive dyspnea.  Of note patient was taking Lasix only prescribed as needed.  In ED, SBP 194 and clinical picture consistent with decompensated CHF.   Assessment & Plan:   Acute on chronic diastolic CHF: Cardiology consulted and assisted with care.  Treated initially with IV Lasix 40 mg 3 times daily.  -2.87 L since admission.  Weight down by about 7 pounds since admission.  Clinically euvolemic and has been transitioned to oral Lasix 40 mg twice daily.  Cardiology signed off and have arranged outpatient follow-up.  Overall poor prognosis.  Patient being discharged home with home hospice services.  Decision to continue medical management versus transitioning to comfort oriented care to be determined during home hospice follow-up.  Right pleural effusion status post thoracentesis/pneumothorax: Underwent ultrasound-guided thoracentesis by IR on 10/1 yielding 620 mL fluid.  She developed small presumably ex vacuo pneumothorax postprocedure which on follow-up chest x-ray had reduced on 10/2 but slightly larger per CXR report 10/3 although no worsening in her respiratory status. I discussed with Dr. Maryclare [name] who saw patient and reviewed CXR 10/3 and recommends that the pneumothorax is stable and does not need any further follow-up unless she becomes symptomatically worse.  Dyspnea improved and clinically stable.  Acute on chronic respiratory failure with hypoxia: Secondary to decompensated CHF and pleural effusion.  Treated underlying cause.  Patient back to prior home level of oxygen.  Improved and stable.  Permanent atrial fibrillation: Rate controlled on Cardizem and Toprol-XL (dose increased this admission).  Not a candidate for anticoagulation due to recent intracranial bleed.  Hypertensive urgency/essential hypertension: Controlled.  Remains on Cardizem, hydralazine, Imdur and metoprolol.  Acute on stage III chronic kidney disease:  Baseline creatinine 1.2-1.4.  Creatinine had peaked  to 1.7 this admission.  Creatinine has improved to 1.3  Dementia: As per caregiver, progressive worsening mental status since her discharge in August to SNF.  Worsened mental status changes may be related to recent intracranial bleed.  Palliative care consulted for goals of care.  Continue Aricept.  DM 2 with hypoglycemia:  Patient was not on hypoglycemic agents prior to admission.  A1c 5.1 on 03/19/2018.  Hypoglycemia resolved.  CBGs intermittently mildly controlled but may continue without hypoglycemics.  Hypothyroid: Synthroid.  Severe protein calorie malnutrition:  Intracranial hemorrhage 03/26/8545 complicated by seizures: Continue Vimpat for seizure prevention.  Hypokalemia and hypomagnesemia: Replaced.  Vitamin D deficiency: Continue to replace.  Normocytic anemia: Stable.  Adult failure to thrive: Multifactorial secondary to advanced age, dementia, multiple severe significant comorbidities and recent acute illnesses.  Palliative care evaluated patient today and discussed with patient's family.  She is doing better today, fed herself breakfast and ate all her cereals, dyspnea improved, getting up to bedside commode with minimal assist and is in good spirits.  Family had asked about hospice facility but she is currently not eligible for hospice facility.  Palliative team have explained to family that this may change at any time and they were encouraged to work with home hospice who will help transitioning to residential hospice when the time is right.  They verbalized understanding.  I discussed with palliative care team.   Consultants:  Cardiology Palliative medicine Interventional radiology  Procedures:  Right therapeutic thoracentesis 10/1   Discharge Instructions  Discharge Instructions    (HEART FAILURE PATIENTS) Call MD:  Anytime you have any of the following symptoms: 1) 3 pound weight gain in 24 hours or 5 pounds in 1 week 2) shortness of breath, with or  without a dry hacking cough 3) swelling in the hands, feet or stomach 4) if you have to sleep on extra pillows at night in order to breathe.   Complete by:  As directed    Call MD for:  difficulty breathing, headache or visual disturbances   Complete by:  As directed    Call MD for:  extreme fatigue   Complete by:  As directed    Call MD for:  persistant dizziness or light-headedness   Complete by:  As directed    Call MD for:  persistant nausea and vomiting   Complete by:  As directed    Call MD for:  severe uncontrolled pain   Complete by:  As directed    Call MD for:  temperature >100.4   Complete by:  As directed    Diet - low sodium heart healthy   Complete by:  As directed    Increase activity slowly   Complete by:  As directed        Medication List    STOP taking these medications   RESOURCE [place] these medications   acetaminophen 650 MG CR tablet Commonly known as:  TYLENOL Take 650 mg by mouth every 8 (eight) hours as needed for pain.   allopurinol 100 MG tablet Commonly known as:  ZYLOPRIM Take 1 tablet (100 mg total) by mouth daily.   cholecalciferol 1000 units tablet Commonly known as:  VITAMIN D Take 1 tablet (1,000 Units total) by mouth daily.   diltiazem 30 MG tablet Commonly known as:  CARDIZEM Take 1 tablet (30 mg total) by mouth 2 (two) times daily.   donepezil 10 MG  tablet Commonly known as:  ARICEPT Take 1 tablet (10 mg total) by mouth at bedtime.   feeding supplement (GLUCERNA SHAKE) Liqd Take 237 mLs by mouth 2 (two) times daily between meals. Start taking on:  04/26/2018   ferrous sulfate 325 (65 FE) MG EC tablet TAKE 1 TABLET (325 MG TOTAL) BY MOUTH 2 (TWO) TIMES DAILY WITH A MEAL.   furosemide 40 MG tablet Commonly known as:  LASIX Take 1 tablet (40 mg total) by mouth daily. What changed:    medication strength  how much to take  when to take this  reasons to take this   hydrALAZINE 25 MG  tablet Commonly known as:  APRESOLINE Take 1 tablet (25 mg total) by mouth 3 (three) times daily.   ipratropium 0.02 % nebulizer solution Commonly known as:  ATROVENT Take 2.5 mLs (0.5 mg total) by nebulization every 6 (six) hours as needed for wheezing or shortness of breath.   isosorbide mononitrate 30 MG 24 hr tablet Commonly known as:  IMDUR TAKE 1 TABLET (30 MG TOTAL) BY MOUTH 2 (TWO) TIMES DAILY.   lacosamide 50 MG Tabs tablet Commonly known as:  VIMPAT Take 1.5 tablets (75 mg total) by mouth 2 (two) times daily.   levothyroxine 25 MCG tablet Commonly known as:  SYNTHROID, LEVOTHROID Take 1 tablet (25 mcg total) by mouth daily before breakfast.   metoprolol succinate 25 MG 24 hr tablet Commonly known as:  TOPROL-XL Take 2 tablets (50 mg total) by mouth daily. Start taking on:  04/26/2018 What changed:  how much to take   multivitamin with minerals Tabs tablet Take 1 tablet by mouth daily. Start taking on:  04/26/2018   OXYGEN Inhale 3 L into the lungs continuous.   potassium chloride 10 MEQ tablet Commonly known as:  K-DUR Take 1 tablet (10 mEq total) by mouth daily.   sertraline 50 MG tablet Commonly known as:  ZOLOFT Take 50 mg by mouth daily.      Follow-up Information    [facility] [place] [place] Follow up.   Why:  Home Hospice RN- will call to arrange initial visit Contact information: [address] [place]       Nicole [name], [place] Follow up on 05/05/2018.   Specialties:  Cardiology, Radiology Why:  Your follow up appointment will be on 05/05/18 at 0930am with Nicole Deforest, PA with Dr. Dewitt Rota information: [address] [place] [address] [phone]        Hollace Kinnier L, DO. Schedule an appointment as soon as possible for a visit.   Specialty:  Geriatric Medicine Why:  Hospice MD. May consider repeating labs (CBC & BMP) in a week depending on how aggressive family wishes  to pursue. Contact information: [address] [place] [address] [phone]          Allergies  Allergen Reactions  . Iohexol Nausea And Vomiting  . Iodinated Diagnostic Agents Nausea And Vomiting  . Z-Pak [Azithromycin] Other (See Comments)    Reaction unknown by family      Procedures/Studies: Dg Chest 1 View  Result Date: 04/22/2018 CLINICAL DATA:  Post right-sided thoracentesis. EXAM: CHEST  1 VIEW COMPARISON:  04/21/2018; 04/17/2018; ultrasound-guided right-sided thoracentesis - 03/19/2018; chest CT - 02/09/2018 FINDINGS: Grossly unchanged enlarged cardiac silhouette and mediastinal contours with prominence of the main pulmonary artery. Atherosclerotic plaque within the thoracic aorta. Interval reduction/resolution of right-sided pleural effusion post thoracentesis with development of a small  presumably ex vacuo right-sided pneumothorax. No mediastinal shift. Pulmonary vasculature remains indistinct with cephalization of flow. Unchanged trace left-sided pleural effusion. Improved aeration of lung bases with persistent bibasilar opacities, likely atelectasis. No new focal airspace opacities. No acute osseous abnormalities. Post cholecystectomy. IMPRESSION: 1. Interval reduction/resolution of right-sided pneumothorax post thoracentesis with development of a small presumably ex vacuo pneumothorax. Continued attention on follow-up is advised. 2. Improved aeration of lung bases with persistent bibasilar opacities, likely atelectasis. 3. Similar findings cardiomegaly, pulmonary edema and trace left-sided pleural effusion. Electronically Signed   By: Sandi Mariscal M.D.   On: 04/22/2018 15:31   Dg Chest 2 View  Result Date: 04/21/2018 CLINICAL DATA:  Shortness of breath EXAM: CHEST - 2 VIEW COMPARISON:  04/17/2018 FINDINGS: The patient is rotated to the right on today's radiograph, reducing diagnostic sensitivity and specificity. Moderate enlargement of the cardiopericardial silhouette,  similar to prior. Atherosclerotic calcification of the aortic arch. Continued airspace opacity obscuring the right hemidiaphragm, primarily in the right lower lobe, with some volume loss and likely underlying moderate to large right pleural effusion. Mildly improved aeration at the left lung base. Blunted right and possibly left costophrenic angles. Prominent main pulmonary artery contour. Degenerative glenohumeral arthropathy bilaterally. IMPRESSION: 1. Roughly similar moderate to large right pleural effusion with passive atelectasis. Small left pleural effusion. Improved aeration at the left lung base. 2. Moderate cardiomegaly. 3.  Aortic Atherosclerosis (ICD10-I70.0). 4. Enlarged pulmonary artery, potentially reflecting pulmonary arterial hypertension. Electronically Signed   By: Van Clines M.D.   On: 04/21/2018 11:47   Dg Chest 2 View  Result Date: 04/17/2018 CLINICAL DATA:  Shortness of breath.  CHF. EXAM: CHEST - 2 VIEW COMPARISON:  03/19/2018. FINDINGS: Cardiomegaly with diffuse prominent bilateral pulmonary infiltrates/edema and bilateral pleural effusions. Findings consistent congestive heart failure. Bilateral moaning cannot be excluded. IMPRESSION: Findings consistent congestive heart failure with prominent bilateral pulmonary edema and bilateral pleural effusions. Bilateral pneumonia cannot be excluded. Electronically Signed   By: Marcello Moores  Register   On: 04/17/2018 11:44   Dg Chest Port 1 View  Result Date: 04/24/2018 CLINICAL DATA:  Short of breath.  Pneumothorax. EXAM: PORTABLE CHEST 1 VIEW COMPARISON:  04/23/2018 FINDINGS: Progression of right apical pneumothorax now approximately 2.5 cm. Progression of right pleural effusion and right lower lobe atelectasis Small left effusion and mild left lower lobe atelectasis. Cardiac enlargement with mild vascular congestion. Negative for edema. Atherosclerotic aortic arch. IMPRESSION: Progressive right pneumothorax. Mild progression of right  lower lobe atelectasis and effusion. Electronically Signed   By: Franchot Gallo M.D.   On: 04/24/2018 10:36   Dg Chest Port 1 View  Result Date: 04/23/2018 CLINICAL DATA:  Shortness of breath EXAM: PORTABLE CHEST 1 VIEW COMPARISON:  Portable chest x-ray of 04/22/2018 and 04/21/2017 FINDINGS: There has been slight decrease in volume of the right pneumothorax. Aeration has decreased slightly. Bilateral pleural effusions remain with bibasilar volume loss right greater than left. Cardiomegaly is stable. IMPRESSION: 1. Slight decrease in volume of small right apical pneumothorax. 2. No change in bilateral pleural effusions with bibasilar volume loss right greater than left. Stable cardiomegaly Electronically Signed   By: Ivar Drape M.D.   On: 04/23/2018 08:31   Ir Thoracentesis Asp Pleural Space W/img Guide  Result Date: 04/22/2018 INDICATION: Patient with history of heart failure, colon cancer, chronic kidney disease, dementia, malnutrition, recurrent right pleural effusion; request received for diagnostic and therapeutic right thoracentesis. EXAM: ULTRASOUND GUIDED DIAGNOSTIC AND THERAPEUTIC RIGHT THORACENTESIS MEDICATIONS: None COMPLICATIONS: SIR Level A -  No therapy, no consequence. Procedure complicated by development of a small asymptomatic presumably ex vacuo right-sided pneumothorax PROCEDURE: An ultrasound guided thoracentesis was thoroughly discussed with the patient/family and questions answered. The benefits, risks, alternatives and complications were also discussed. The patient understands and wishes to proceed with the procedure. Written consent was obtained. Ultrasound was performed to localize and mark an adequate pocket of fluid in the right chest. The area was then prepped and draped in the normal sterile fashion. 1% Lidocaine was used for local anesthesia. Under ultrasound guidance a 6 Fr Safe-T-Centesis catheter was introduced. Thoracentesis was performed. The catheter was removed and a  dressing applied. FINDINGS: A total of approximately 620 cc of blood-tinged/amber fluid was removed. Samples were sent to the laboratory as requested by the clinical team. IMPRESSION: Successful ultrasound guided diagnostic and therapeutic right thoracentesis yielding 620 cc of pleural fluid. Read by: Rowe Robert, PA-C Electronically Signed   By: Sandi Mariscal M.D.   On: 04/22/2018 15:15      Subjective: Denies complaints.  Excited to go home.  Denies dyspnea or pain.  As per RN, no acute issues and looks and feels much better than she did yesterday.  Discharge Exam:  Vitals:   04/24/18 1941 04/25/18 0556 04/25/18 1021 04/25/18 1146  BP: 119/67 132/69 119/80 102/62  Pulse: (!) 116 69 99 94  Resp: 18 18  18   Temp: 99.1 F (37.3 C) 98.3 F (36.8 C)  98.4 F (36.9 C)  TempSrc: Oral Oral  Oral  SpO2: 100% 100% 99% 99%  Weight:  53.4 kg    Height:        General exam: Pleasant elderly female, moderately built and frail, sitting up comfortably in bed without distress.  Appears to be in good spirits today. Respiratory system:  Diminished breath sounds in the bases but otherwise clear to auscultation without wheezing or rhonchi.  No increased work of breathing. Cardiovascular system: S1 & S2 heard, RRR. No JVD, murmurs, rubs, gallops or clicks. No pedal edema.   Gastrointestinal system: Abdomen is nondistended, soft and nontender. No organomegaly or masses felt. Normal bowel sounds heard. Central nervous system:  Alert and oriented to self and partly to place. No focal neurological deficits. Extremities: Symmetric 5 x 5 power. Skin: No rashes, lesions or ulcers Psychiatry: Judgement and insight impaired. Mood & affect appropriate.     The results of significant diagnostics from this hospitalization (including imaging, microbiology, ancillary and laboratory) are listed below for reference.     Microbiology: Recent Results (from the past 240 hour(s))  Gram stain     Status: None    Collection Time: 04/22/18  3:28 PM  Result Value Ref Range Status   Specimen Description PLEURAL  Final   Special Requests NONE  Final   Gram Stain   Final    WBC PRESENT, PREDOMINANTLY MONONUCLEAR NO ORGANISMS SEEN CYTOSPIN SMEAR Performed at [facility], [address], [place], [place] [address]    Report Status 04/22/2018 FINAL  Final  Culture, body fluid-bottle     Status: None (Preliminary result)   Collection Time: 04/22/18  3:28 PM  Result Value Ref Range Status   Specimen Description PERITONEAL  Final   Special Requests NONE  Final   Culture   Final    NO GROWTH 3 DAYS Performed at [facility] [address] [place], [place] [address]    Report Status PENDING  Incomplete     Labs: CBC: Recent Labs  Lab 04/20/18 0511 04/22/18 0752  WBC 6.7 6.3  HGB 12.1 11.5*  HCT 35.1* 33.7*  MCV 79.6 79.3  PLT 226 574   Basic Metabolic Panel: Recent Labs  Lab 04/20/18 0511 04/21/18 0606 04/22/18 0752 04/23/18 0427 04/24/18 0600 04/25/18 0533  NA 139 139 137 136 139 137  K 4.1 3.7 3.7 3.5 3.4* 4.3  CL 96* 95* 96* 96* 95* 96*  CO2 33* 36* 36* 36* 37* 36*  GLUCOSE 94 131* 84 94 83 99  BUN 18 27* 28* 32* 28* 32*  CREATININE 1.26* 1.45* 1.45* 1.44* 1.28* 1.30*  CALCIUM 8.5* 8.7* 8.3* 8.0* 8.3* 8.4*  MG 1.4* 1.9  --  1.9  --   --    BNP (last 3 results) Recent Labs    02/26/18 0823 03/17/18 1421 04/17/18 1034  BNP 969.5* 584.0* 2,577.0*   CBG: Recent Labs  Lab 04/24/18 1133 04/24/18 1641 04/24/18 2138 04/25/18 0745 04/25/18 1147  GLUCAP 113* 162* 90 89 208*   Urinalysis    Component Value Date/Time   COLORURINE YELLOW 04/17/2018 2035   APPEARANCEUR CLEAR 04/17/2018 2035   LABSPEC 1.009 04/17/2018 2035   PHURINE 5.0 04/17/2018 2035   GLUCOSEU NEGATIVE 04/17/2018 2035   HGBUR NEGATIVE 04/17/2018 2035   BILIRUBINUR NEGATIVE 04/17/2018 2035   KETONESUR NEGATIVE 04/17/2018 2035   PROTEINUR NEGATIVE 04/17/2018 2035   UROBILINOGEN 2.0  (H) 04/13/2015 1844   NITRITE NEGATIVE 04/17/2018 2035   LEUKOCYTESUR NEGATIVE 04/17/2018 2035      Time coordinating discharge: 40 minutes  SIGNED:  Vernell Leep, MD, FACP, [facility]. [facility] Pager [phone]  If 7PM-7AM, please contact night-coverage [email or web address] Password [email or web address] 04/25/2018, 2:26 PM

## 2018-04-25 NOTE — Discharge Instructions (Signed)

## 2018-04-27 DIAGNOSIS — M109 Gout, unspecified: Secondary | ICD-10-CM | POA: Diagnosis not present

## 2018-04-27 DIAGNOSIS — I129 Hypertensive chronic kidney disease with stage 1 through stage 4 chronic kidney disease, or unspecified chronic kidney disease: Secondary | ICD-10-CM | POA: Diagnosis not present

## 2018-04-27 DIAGNOSIS — J189 Pneumonia, unspecified organism: Secondary | ICD-10-CM | POA: Diagnosis not present

## 2018-04-27 DIAGNOSIS — R0602 Shortness of breath: Secondary | ICD-10-CM | POA: Diagnosis not present

## 2018-04-27 DIAGNOSIS — I5032 Chronic diastolic (congestive) heart failure: Secondary | ICD-10-CM | POA: Diagnosis not present

## 2018-04-27 DIAGNOSIS — M6281 Muscle weakness (generalized): Secondary | ICD-10-CM | POA: Diagnosis not present

## 2018-04-27 DIAGNOSIS — R69 Illness, unspecified: Secondary | ICD-10-CM | POA: Diagnosis not present

## 2018-04-27 LAB — CULTURE, BODY FLUID W GRAM STAIN -BOTTLE: Culture: NO GROWTH

## 2018-04-28 ENCOUNTER — Telehealth: Payer: Self-pay

## 2018-04-28 NOTE — Telephone Encounter (Signed)
Transition Care Management Follow-up Telephone Call  Date of discharge and from where: [facility] on 04/25/2018  How have you been since you were released from the hospital? Per patient's daughter her breathing has been good sometimes and then SOB other times. Hospice was contacted as well  Any questions or concerns? No   Items Reviewed:  Did the pt receive and understand the discharge instructions provided? Yes  per patient's daughter  Medications obtained and verified? Yes   Any new allergies since your discharge? No  Dietary orders reviewed? Yes  Do you have support at home? Yes has a caregiver with her daily  Other (ie: DME, Home Health, etc) 3L O2  Functional Questionnaire: (I = Independent and D = Dependent) ADL's: I w/ assist  Bathing/Dressing- I w/ assist   Meal Prep- D  Eating- I  Maintaining continence- I  Transferring/Ambulation- Wheelchair or walker  Managing Meds- D   Follow up appointments reviewed:    PCP Hospital f/u appt confirmed? Yes  Scheduled to see Sherrie Mustache, NP on 05/06/2018  @ 3:15pm.  [facility] f/u appt confirmed? N/A  Are transportation arrangements needed? No   If their condition worsens, is the pt aware to call  their PCP or go to the ED? Yes  Was the patient provided with contact information for the PCP's office or ED? Yes  Was the pt encouraged to call back with questions or concerns? Yes

## 2018-05-05 ENCOUNTER — Ambulatory Visit: Payer: Medicare HMO | Admitting: Physician Assistant

## 2018-05-06 ENCOUNTER — Encounter: Payer: Medicare HMO | Admitting: Nurse Practitioner

## 2018-05-08 ENCOUNTER — Other Ambulatory Visit: Payer: Self-pay | Admitting: Internal Medicine

## 2018-05-09 ENCOUNTER — Telehealth: Payer: Self-pay | Admitting: *Deleted

## 2018-05-09 NOTE — Telephone Encounter (Signed)
Hospice calling stating that pt is just not doing well at all, not sure if she needs anything but she is definitely declining. Family thinks she's having acid reflux but they nurse thinks she's transitioning. Hospice just wanted to know if she should do anything before the weekend?

## 2018-05-12 ENCOUNTER — Telehealth: Payer: Self-pay

## 2018-05-12 ENCOUNTER — Ambulatory Visit: Payer: Medicare HMO | Admitting: Neurology

## 2018-05-12 NOTE — Telephone Encounter (Signed)
Noted on Friday.  Glad to hear that she appeared comfortable and new orders were not needed.

## 2018-05-12 NOTE — Telephone Encounter (Signed)
Pt no show for appt today.

## 2018-05-13 ENCOUNTER — Encounter: Payer: Self-pay | Admitting: Neurology

## 2018-05-17 ENCOUNTER — Other Ambulatory Visit: Payer: Self-pay | Admitting: Internal Medicine

## 2018-05-18 ENCOUNTER — Other Ambulatory Visit: Payer: Self-pay | Admitting: Internal Medicine

## 2018-05-19 ENCOUNTER — Telehealth: Payer: Self-pay | Admitting: Nurse Practitioner

## 2018-05-19 ENCOUNTER — Telehealth: Payer: Self-pay

## 2018-05-19 NOTE — Telephone Encounter (Signed)
Nicole [name] called to inform Dr.[name] and staff patient expired on Saturday Jun 07, 2018 around 10 am   My condolences was expressed on behalf of [facility]  S.Chrae B/CMA

## 2018-05-19 NOTE — Telephone Encounter (Signed)
Pts caregiver Nicole [name] call to inform Dr. Leonie Man and NP Karma Lew that the pt had passed as of 06-04-18

## 2018-05-19 NOTE — Telephone Encounter (Signed)
Can we start a card for her family, please?  Thanks!

## 2018-05-23 DEATH — deceased

## 2018-05-27 NOTE — Telephone Encounter (Signed)
Sympathy card in process

## 2018-05-31 ENCOUNTER — Other Ambulatory Visit: Payer: Self-pay | Admitting: Internal Medicine

## 2018-06-02 ENCOUNTER — Other Ambulatory Visit: Payer: Self-pay | Admitting: Internal Medicine

## 2018-06-04 ENCOUNTER — Other Ambulatory Visit: Payer: Self-pay | Admitting: Internal Medicine

## 2018-06-10 IMAGING — CT CT HEAD W/O CM
4 series · 16 of 47 positions shown, 18 images · non-contrast
Comparison: CT scan of March 14, 2016.

CLINICAL DATA: Altered mental status.

EXAM:
CT HEAD WITHOUT CONTRAST
TECHNIQUE: Contiguous axial images were obtained from the base of the skull
through the vertex without intravenous contrast.

[Series 2: head without · axial · non-contrast · 0.41mm/px · z∈[-136,-16]mm · 7 of 33 slices shown, 9 images]
[im 5/33  brain]
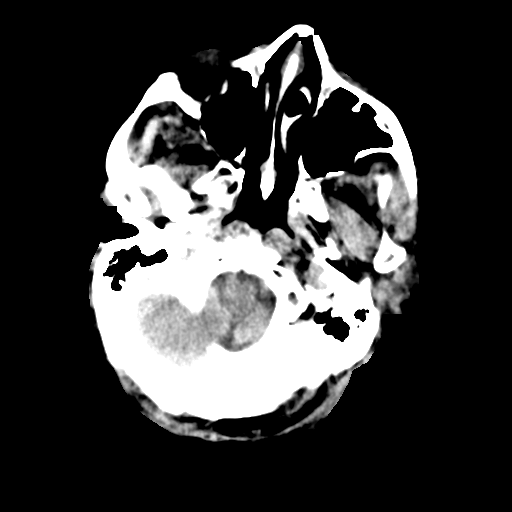
[im 5/33  bone]
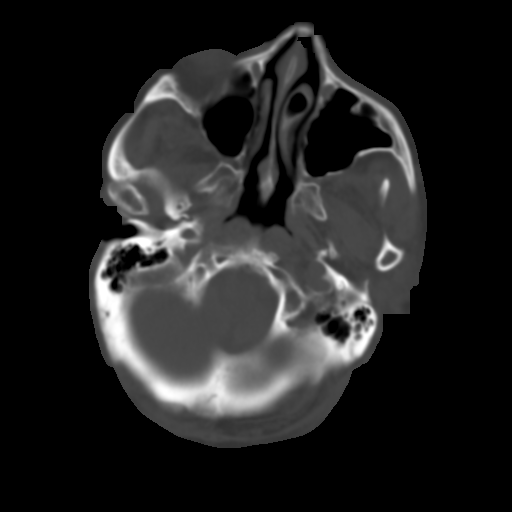
[im 9/33  brain]
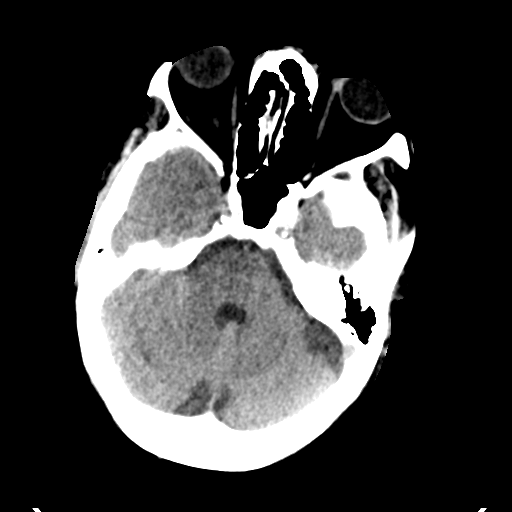
[im 13/33  brain]
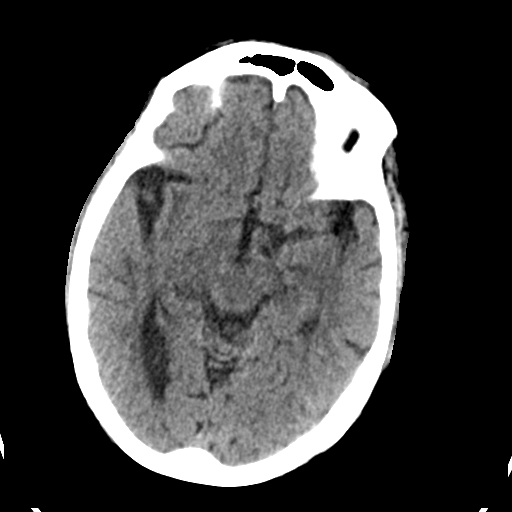
[im 17/33  brain]
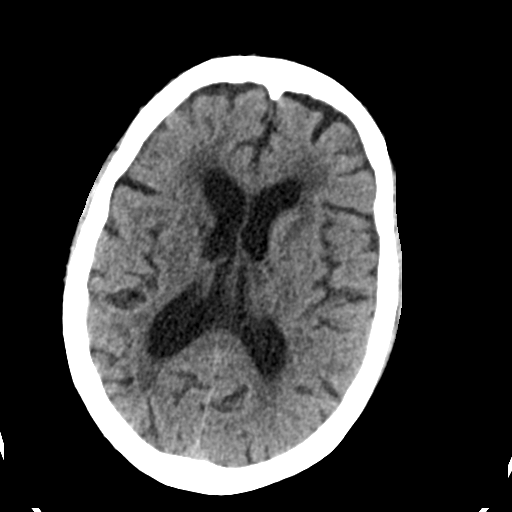
[im 21/33  brain]
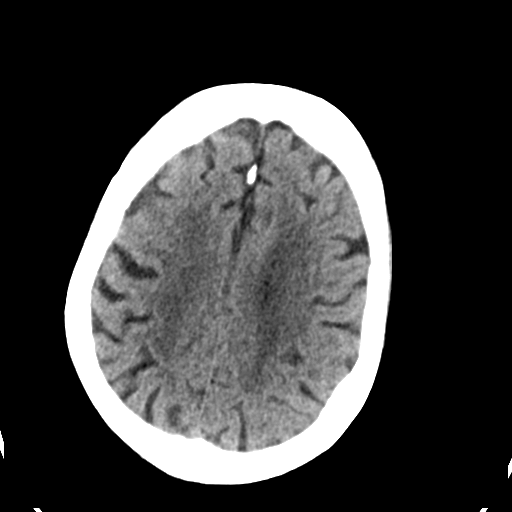
[im 21/33  bone]
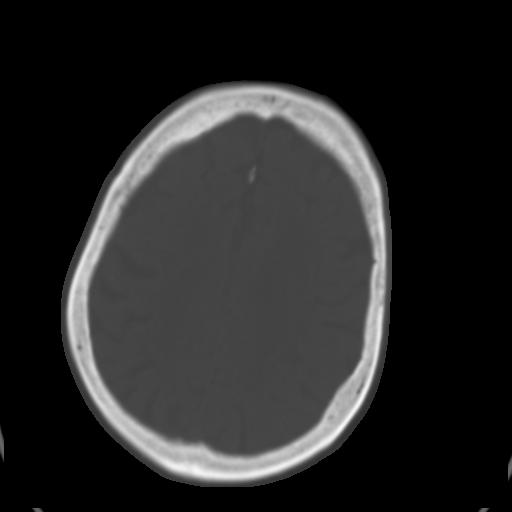
[im 25/33  brain]
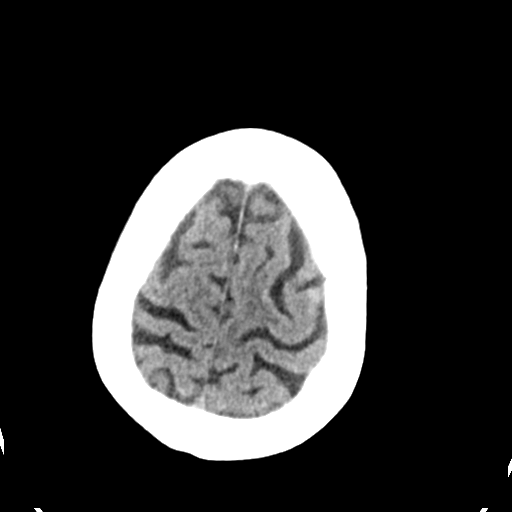
[im 29/33  brain]
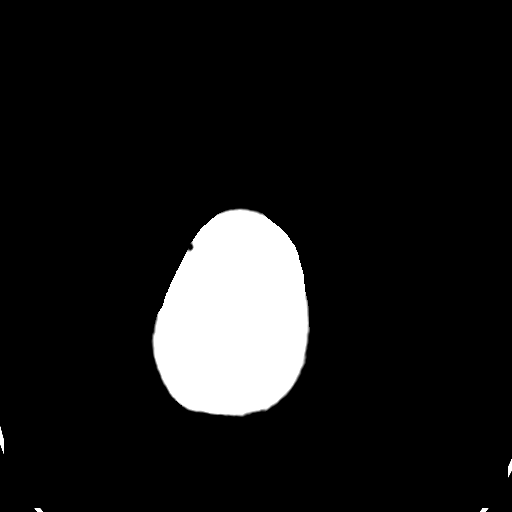

[Series 3: head bone · axial · 0.41mm/px · z∈[-140,-108]mm · 3 of 82 slices shown]
[im 9/82  bone]
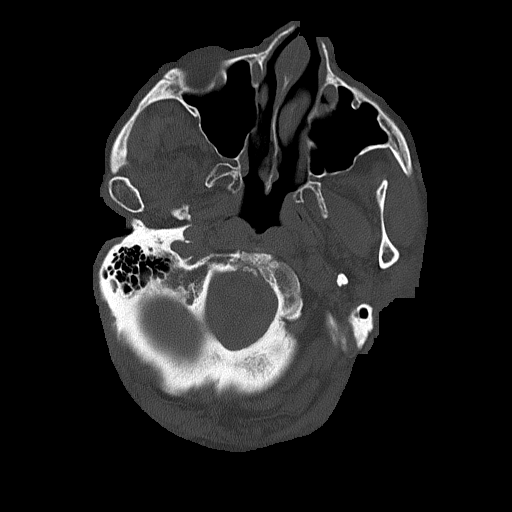
[im 17/82  bone]
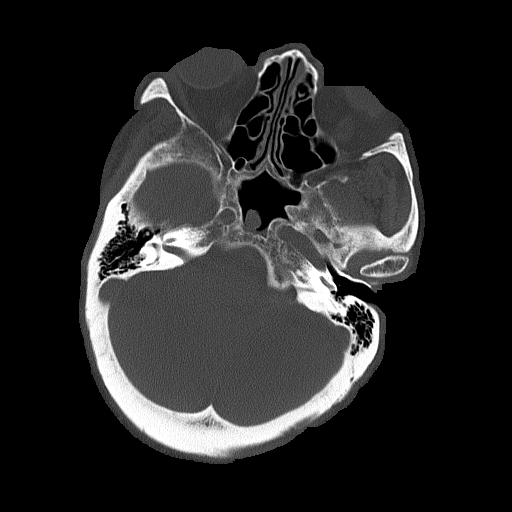
[im 25/82  bone]
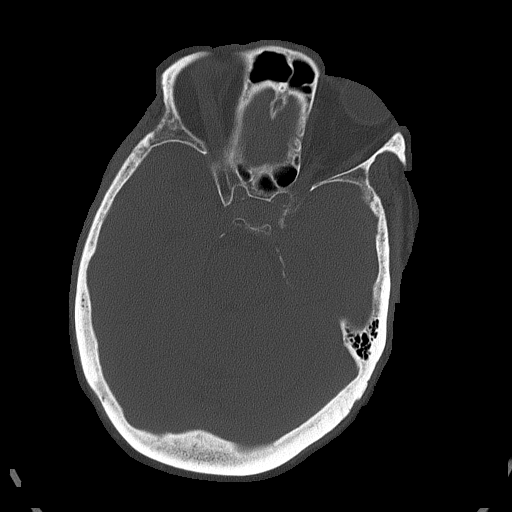

[Series 4: head without cor · coronal · non-contrast · 0.32mm/px · 3 of 67 slices shown]
[im 23/67  brain]
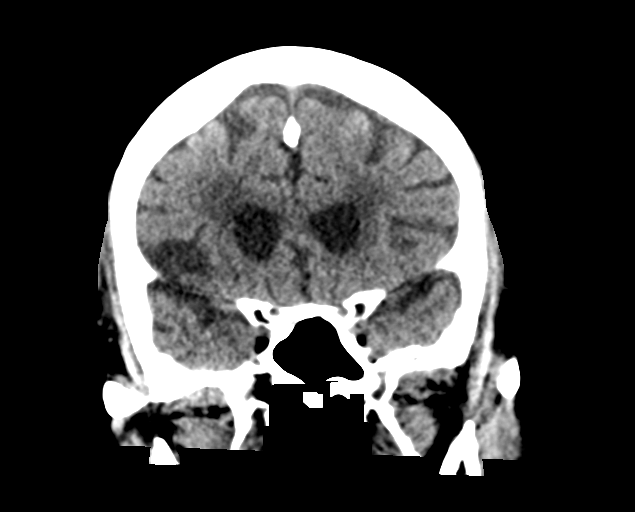
[im 30/67  brain]
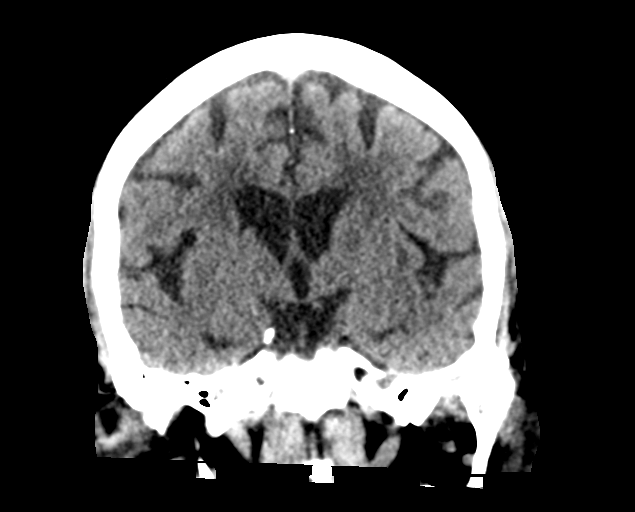
[im 37/67  brain]
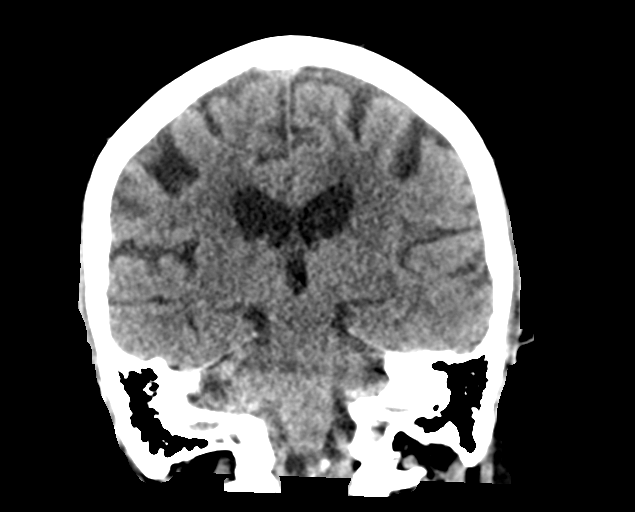

[Series 5: head without sag · sagittal · non-contrast · 0.32mm/px · 3 of 64 slices shown]
[im 28/64  brain]
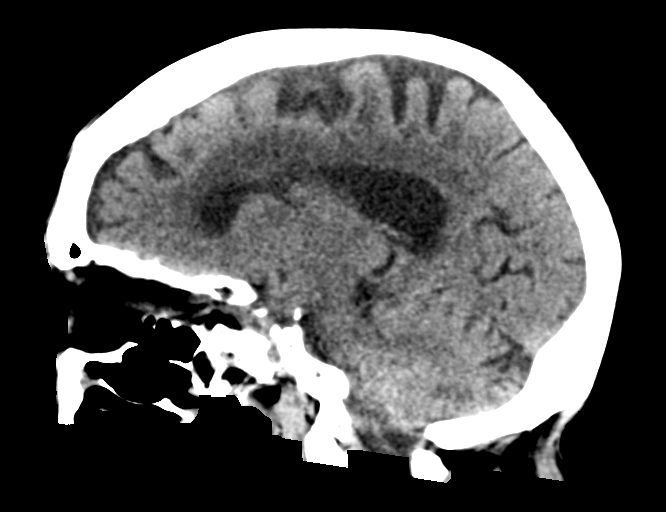
[im 34/64  brain]
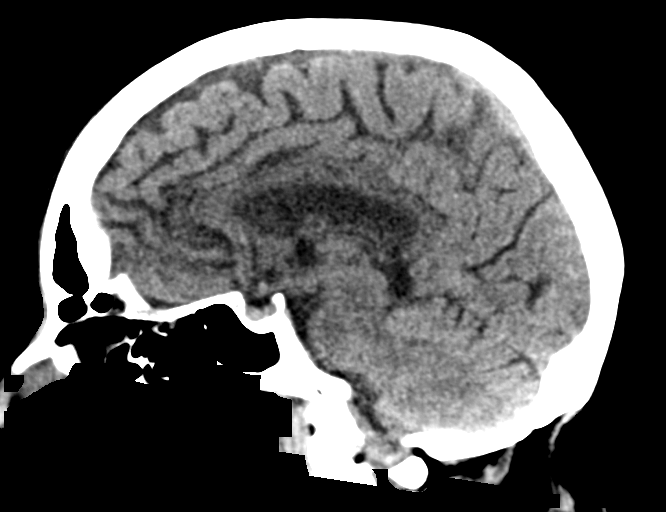
[im 39/64  brain]
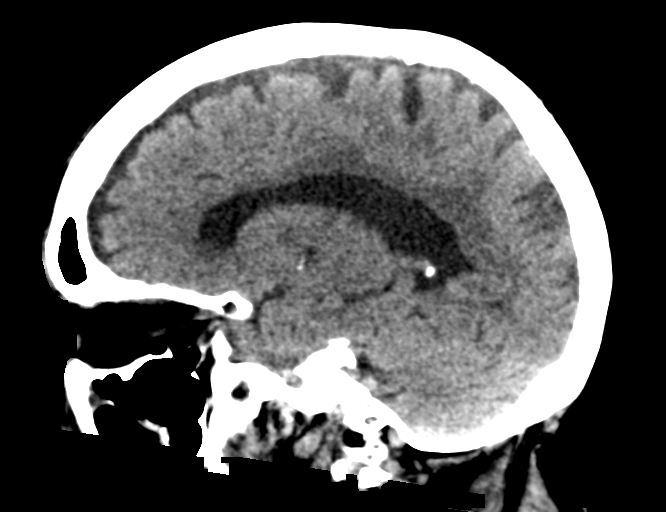

[16 of 47 positions shown; findings below may reference images not displayed]

FINDINGS: Brain: No evidence of acute infarction, hemorrhage, hydrocephalus,
extra-axial collection or mass lesion/mass effect. Mild chronic
ischemic white matter disease is noted. Minimal diffuse cortical
atrophy is noted.

Vascular: Atherosclerosis of carotid siphons is noted.

Skull: Normal. Negative for fracture or focal lesion.

Sinuses/Orbits: No acute finding.

Other: None.
IMPRESSION: Minimal diffuse cortical atrophy. Mild chronic ischemic white matter
disease. No acute intracranial abnormality seen.

## 2018-07-13 IMAGING — NM NM PULMONARY VENT & PERF
16 series · 16 of 16 positions shown · non-contrast
Comparison: 08/22/2016 chest radiograph.

CLINICAL DATA: 85 y/o F; chest pain and shortness of breath.
Positive D-dimer.

EXAM:
NUCLEAR MEDICINE VENTILATION - PERFUSION LUNG SCAN
TECHNIQUE: Ventilation images were obtained in multiple projections using
inhaled aerosol 2c-CCm DTPA. Perfusion images were obtained in
multiple projections after intravenous injection of 2c-CCm MAA.
RADIOPHARMACEUTICALS:  31 mCi Yechnetium-77m DTPA aerosol inhalation
and 3.9 mCi Yechnetium-77m MAA IV

[Series 1: ant/post vent · 4.14mm/px · 1 of 1 slices shown (1 of 2)]
[im 1/1]
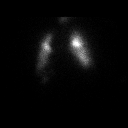

[Series 1: ant/post vent · 4.14mm/px · 1 of 1 slices shown (2 of 2)]
[im 1/1]
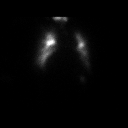

[Series 2: lao/rpo vent · 4.14mm/px · 1 of 1 slices shown (1 of 2)]
[im 1/1]
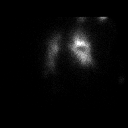

[Series 2: lao/rpo vent · 4.14mm/px · 1 of 1 slices shown (2 of 2)]
[im 1/1]
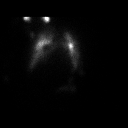

[Series 3: lpo/rao vent · 4.14mm/px · 1 of 1 slices shown (1 of 2)]
[im 1/1]
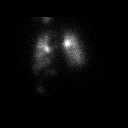

[Series 3: lpo/rao vent · 4.14mm/px · 1 of 1 slices shown (2 of 2)]
[im 1/1]
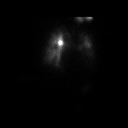

[Series 4: lt lat/rt lat vent · 4.14mm/px · 1 of 1 slices shown (1 of 2)]
[im 1/1]
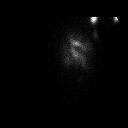

[Series 4: lt lat/rt lat vent · 4.14mm/px · 1 of 1 slices shown (2 of 2)]
[im 1/1]
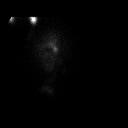

[Series 5: lt lat/rt lat perf · 4.14mm/px · 1 of 1 slices shown (1 of 2)]
[im 1/1]
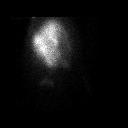

[Series 5: lt lat/rt lat perf · 4.14mm/px · 1 of 1 slices shown (2 of 2)]
[im 1/1]
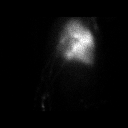

[Series 6: lpo/rao perf · 4.14mm/px · 1 of 1 slices shown (1 of 2)]
[im 1/1]
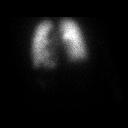

[Series 6: lpo/rao perf · 4.14mm/px · 1 of 1 slices shown (2 of 2)]
[im 1/1]
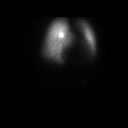

[Series 7: ant/post perf · 4.14mm/px · 1 of 1 slices shown (1 of 2)]
[im 1/1]
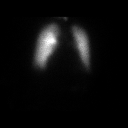

[Series 7: ant/post perf · 4.14mm/px · 1 of 1 slices shown (2 of 2)]
[im 1/1]
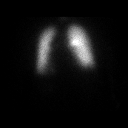

[Series 8: lao/rpo perf · 4.14mm/px · 1 of 1 slices shown (1 of 2)]
[im 1/1]
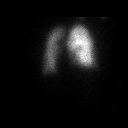

[Series 8: lao/rpo perf · 4.14mm/px · 1 of 1 slices shown (2 of 2)]
[im 1/1]
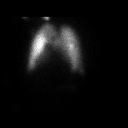

[16 of 16 positions shown; findings below may reference images not displayed]

FINDINGS: Ventilation: Diffusely heterogeneous ventilation probably
representing airways disease.

Perfusion: Large wedge-shaped mismatched ventilation perfusion
defect within the right lower lobe. No radiographic correlate.
IMPRESSION: 1. Single large V/Q mismatch in the right lower lobe. Intermediate
probability of pulmonary embolus.
2. Diffuse heterogeneity of ventilation tracer uptake likely
represents airways disease.

By: Farhad Amani M.D.

## 2018-07-13 IMAGING — DX DG CHEST 2V
2 series · 2 of 2 positions shown · non-contrast
Comparison: PA and lateral chest x-ray May 01, 2016

CLINICAL DATA: Chest pain, shortness of breath which began this
morning. History of CHF, hypertension, pulmonary hypertension,
colonic malignancy, peripheral vascular disease, and diabetes.

EXAM:
CHEST  2 VIEW

[chest pa]
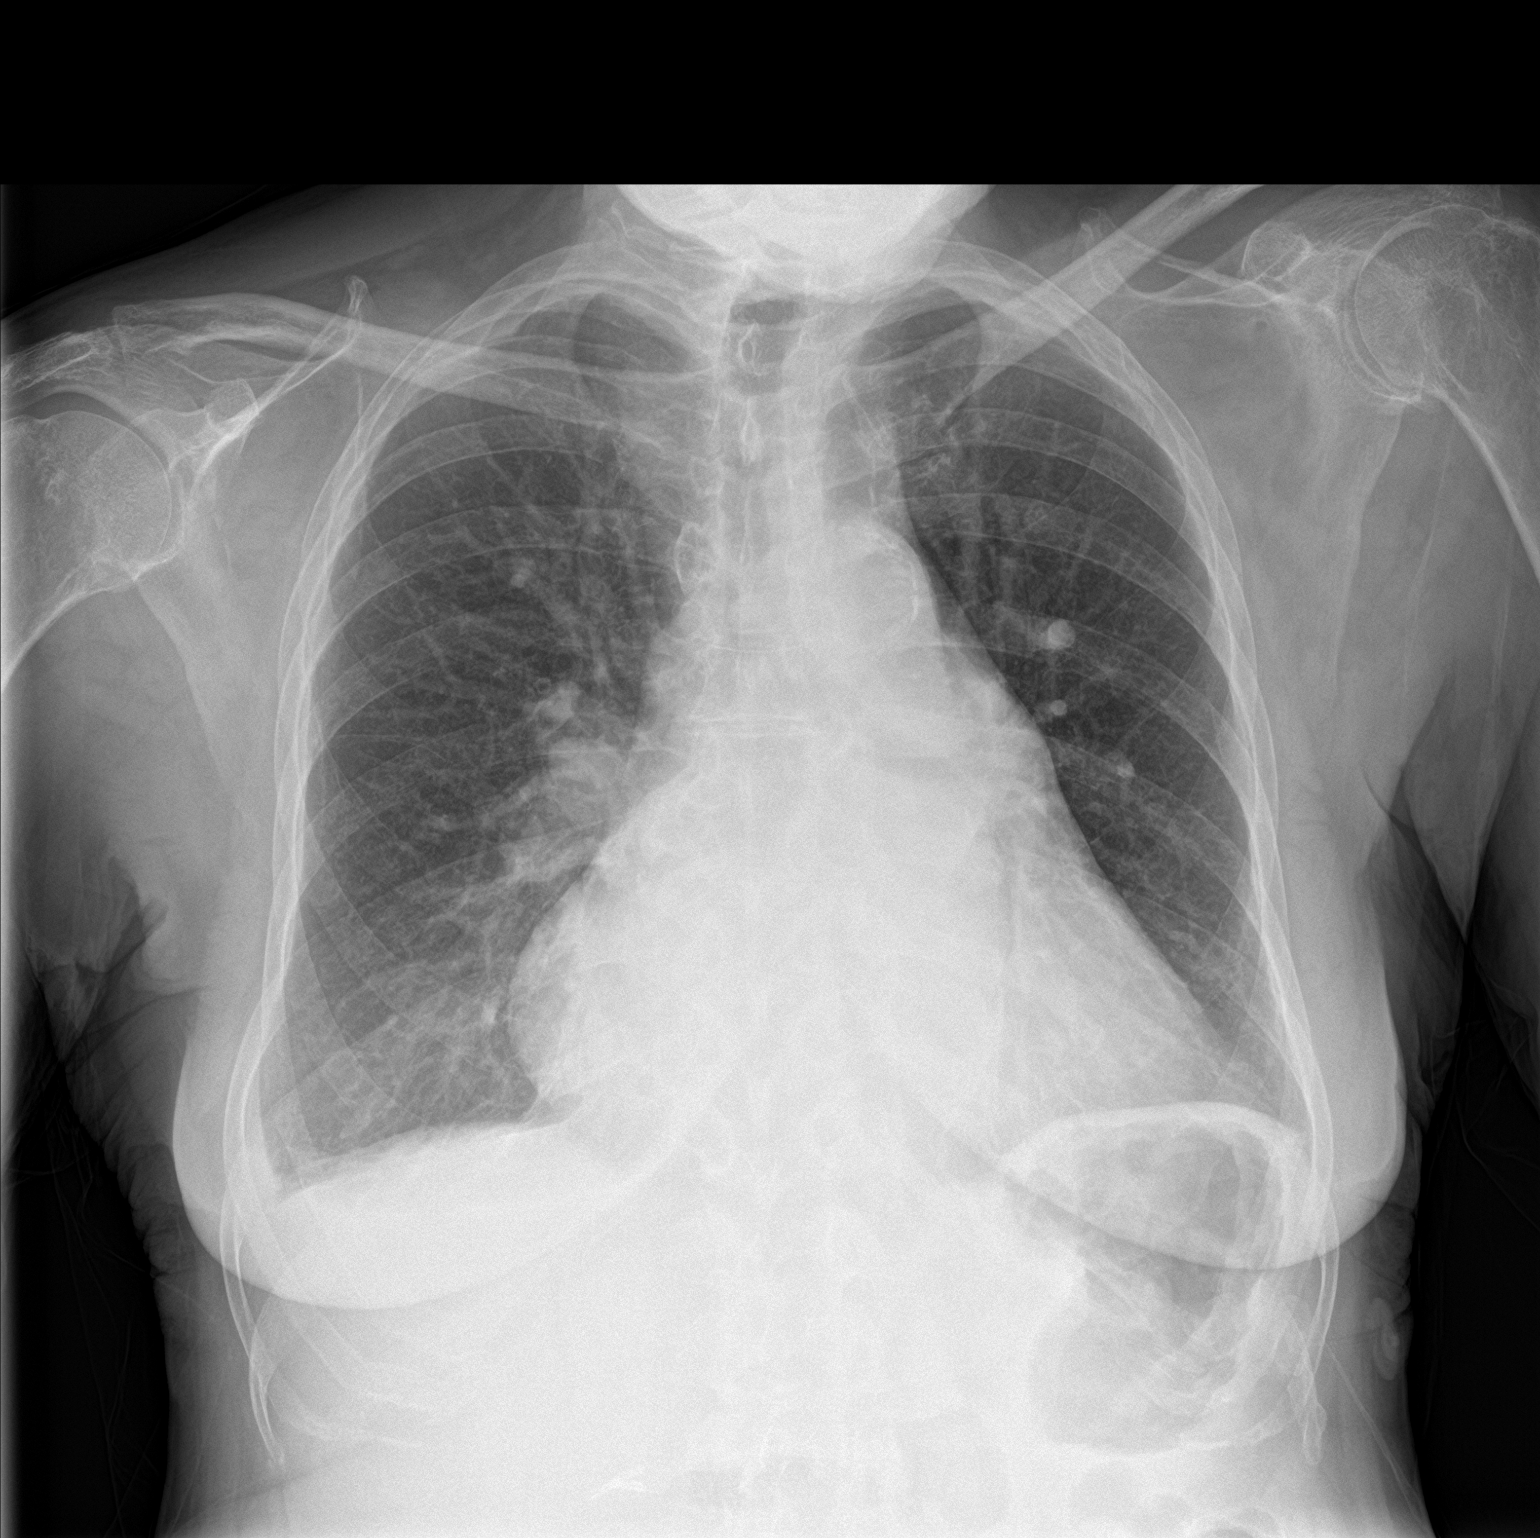

[chest lat]
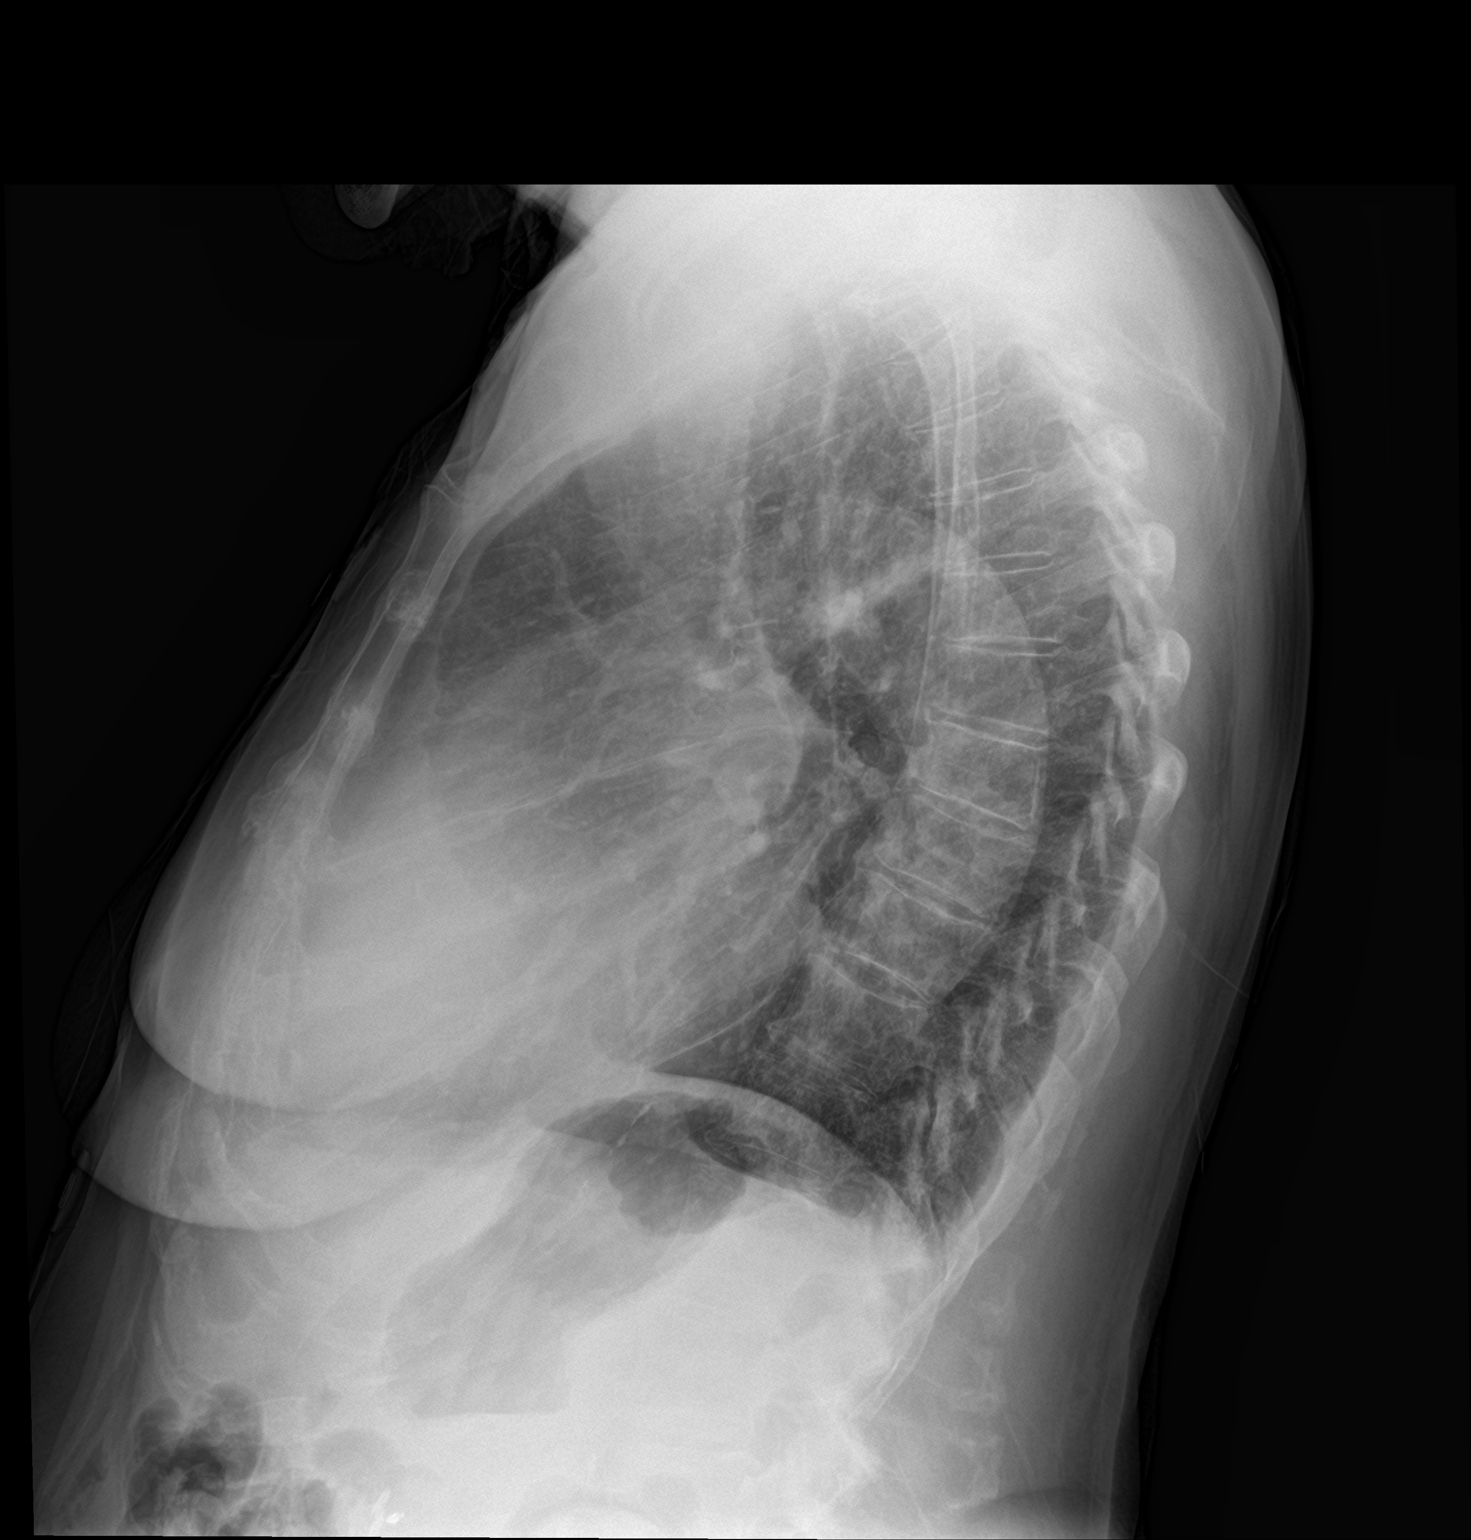

[2 of 2 positions shown; findings below may reference images not displayed]

FINDINGS: The lungs are mildly hyperinflated with hemidiaphragm flattening.
There is no focal infiltrate. The cardiac silhouette is enlarged.
The central pulmonary vascularity is mildly prominent. No pulmonary
interstitial edema is observed. There is a small right pleural
effusion. There is calcification in the wall of the aortic arch. The
bony thorax exhibits no acute abnormality.
IMPRESSION: COPD. Cardiomegaly with out definite pulmonary edema. Small right
pleural effusion No focal pneumonia.

Thoracic aortic atherosclerosis.

## 2018-07-14 ENCOUNTER — Ambulatory Visit: Payer: Self-pay

## 2018-07-14 IMAGING — CT CT ANGIO CHEST
2 of 6 series · 18 of 36 positions shown · IV contrast (Omni 300)
Comparison: None.

CLINICAL DATA: Acute chest pain. Substernal chest pain. Worsening
short of breath with exertion. Positive D-dimer.

EXAM:
CT ANGIOGRAPHY CHEST WITH CONTRAST
TECHNIQUE: Multidetector CT imaging of the chest was performed using the
standard protocol during bolus administration of intravenous
contrast. Multiplanar CT image reconstructions and MIPs were
obtained to evaluate the vascular anatomy.
CONTRAST:  90 cc Isovue 370

[Series 6: pe thins · axial · 0.63mm/px · z∈[+1064,+1297]mm · 17 of 263 slices shown]
[im 15/263  lung]
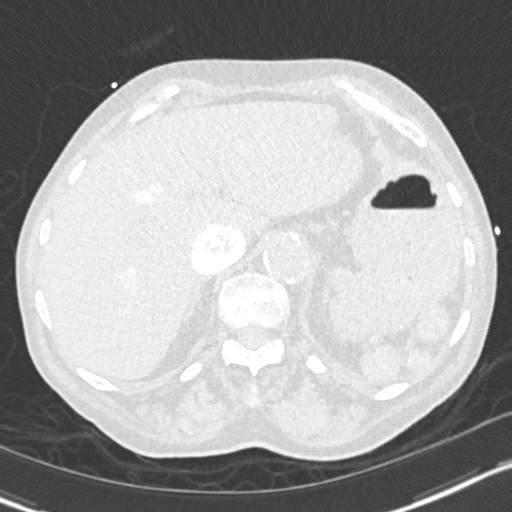
[im 30/263  mediastinal]
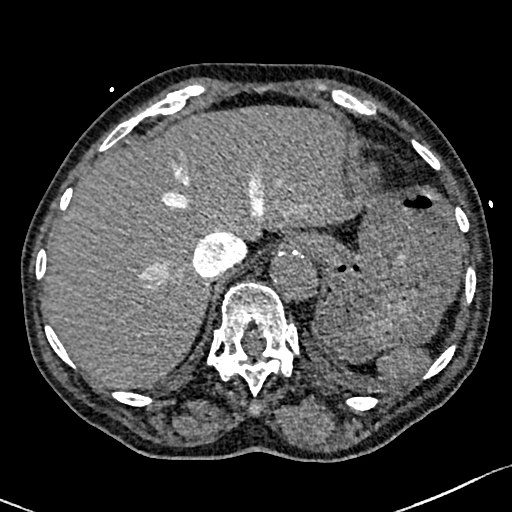
[im 44/263  lung]
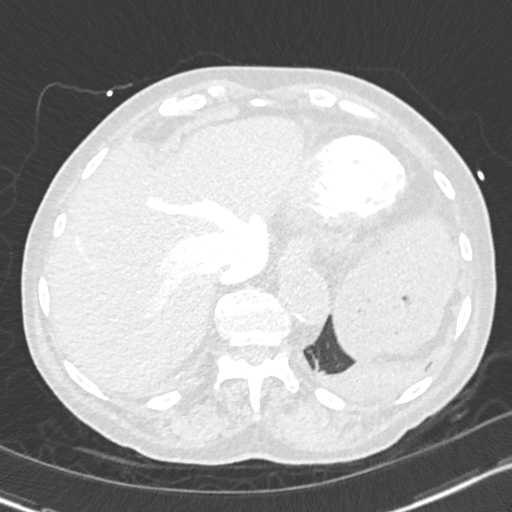
[im 59/263  mediastinal]
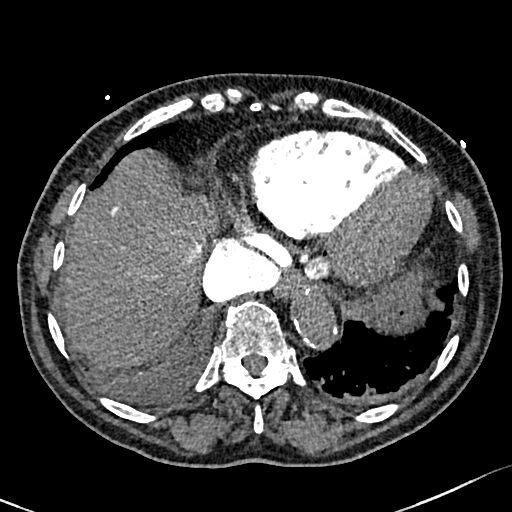
[im 73/263  lung]
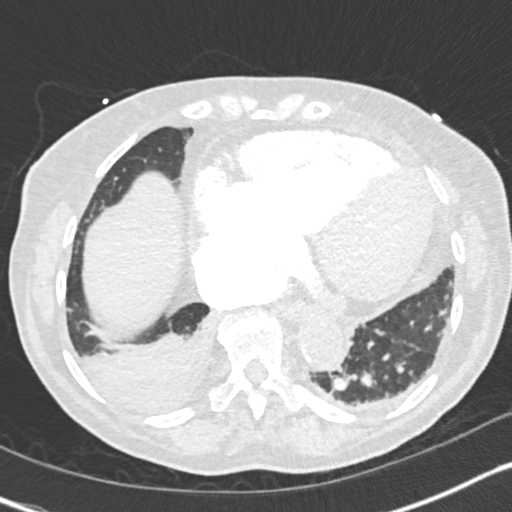
[im 88/263  mediastinal]
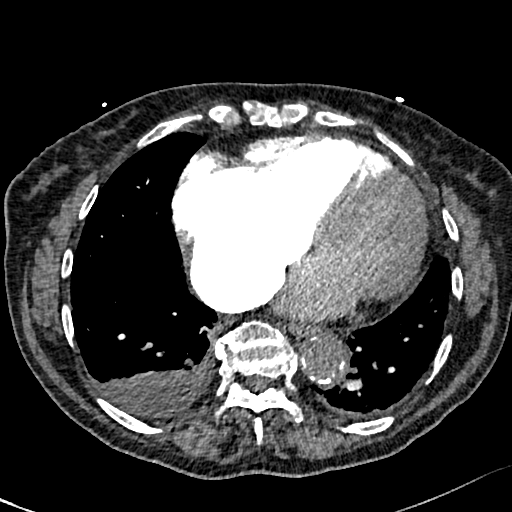
[im 102/263  lung]
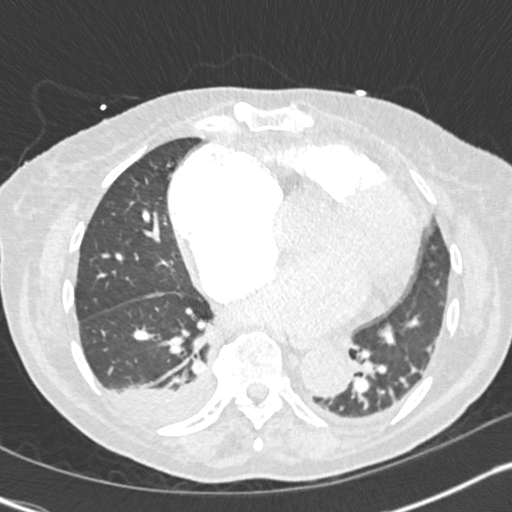
[im 117/263  mediastinal]
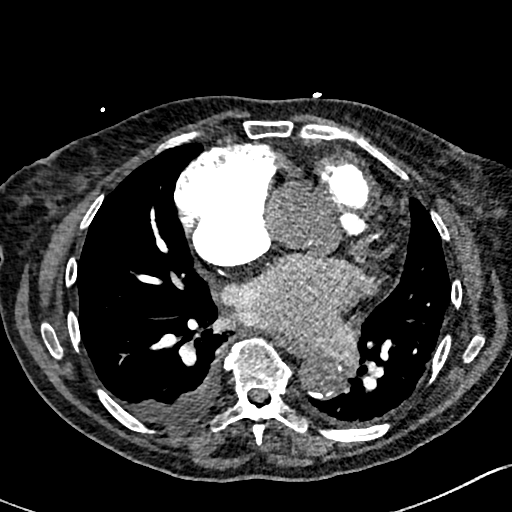
[im 132/263  lung]
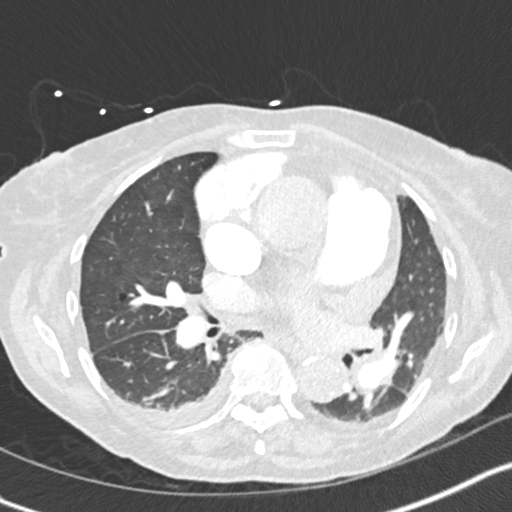
[im 146/263  mediastinal]
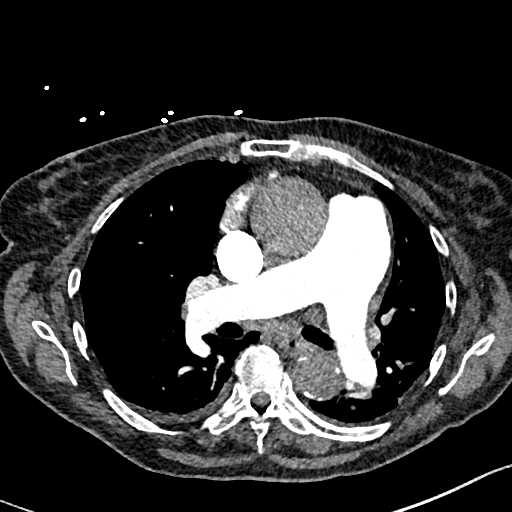
[im 161/263  lung]
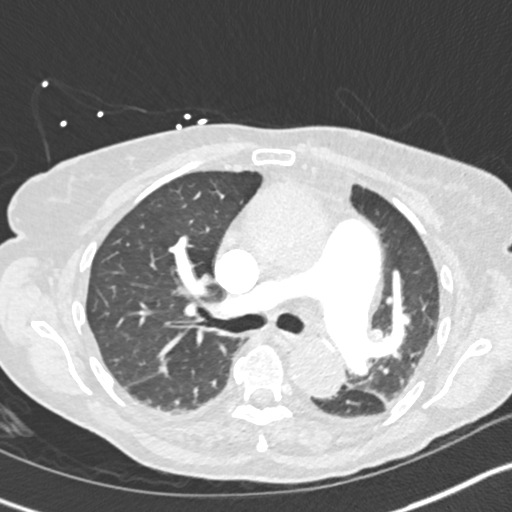
[im 175/263  mediastinal]
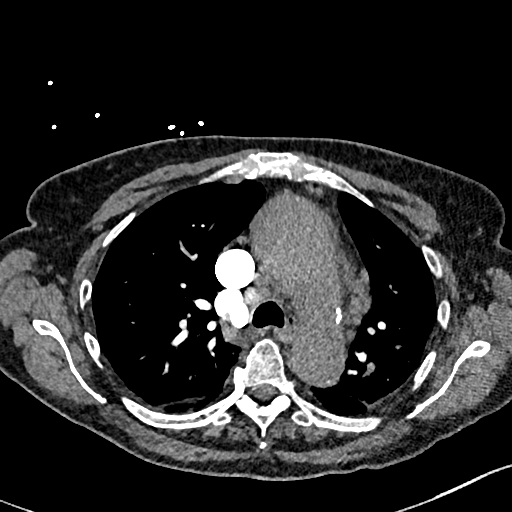
[im 190/263  lung]
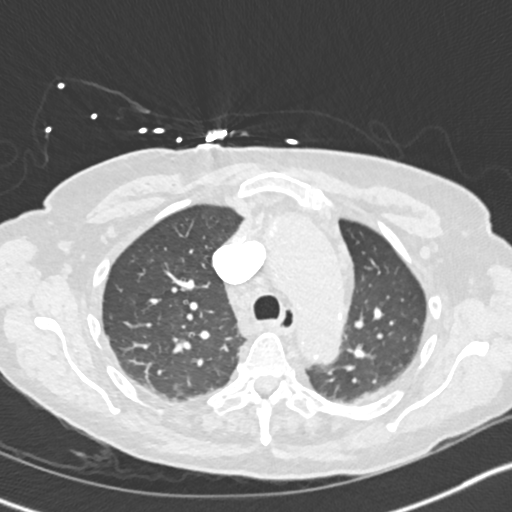
[im 204/263  mediastinal]
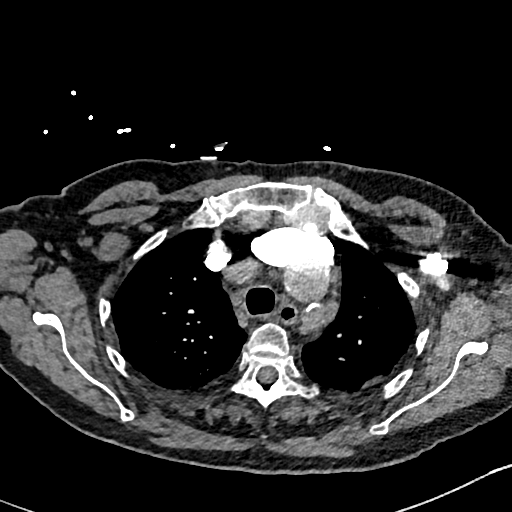
[im 219/263  lung]
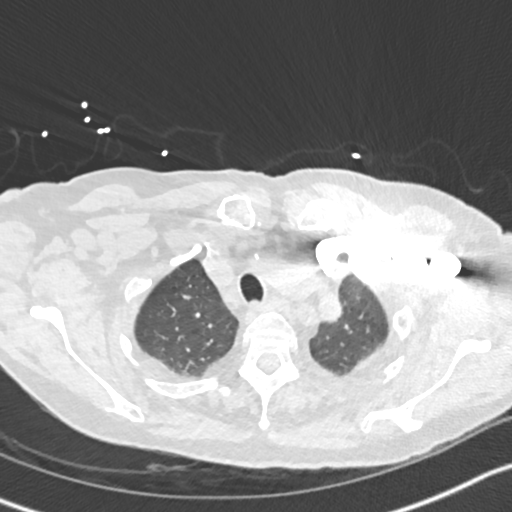
[im 233/263  mediastinal]
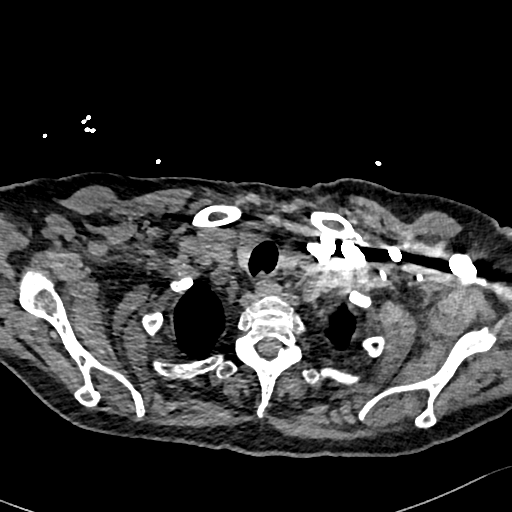
[im 248/263  lung]
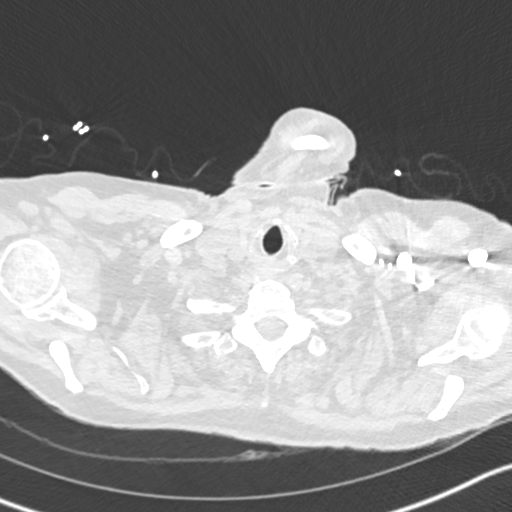

[Series 7: pe 2mm cor · coronal · 0.53mm/px · 1 of 117 slices shown]
[im 59/117  mediastinal]
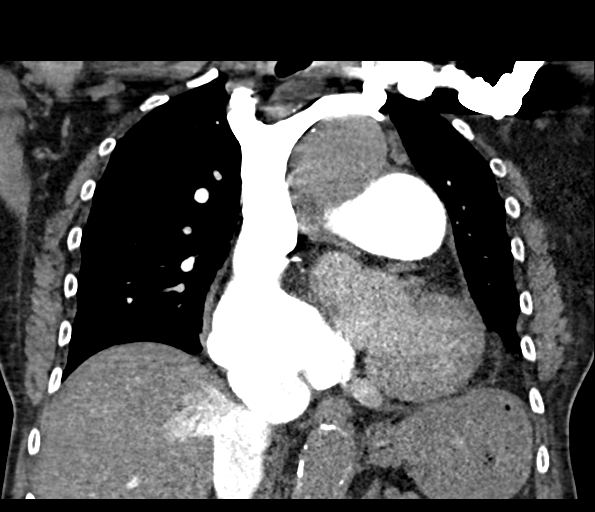

[18 of 36 positions shown; findings below may reference images not displayed]

FINDINGS: Cardiovascular: No filling defects within the main pulmonary
arteries. Within the posterior RIGHT lower lobe pulmonary artery
there an eccentric filling defect (image 157, series 6). There is
mild expansion of the artery compare to adjacent vessels (image 156,
series 6).

Similarly, in the posterior segment of the LEFT lower lobe pulmonary
artery, there is eccentric filling defect (image 164, series 6) with
expansion of the vessel. Small filling defects more distally the
RIGHT LEFT lower lobe pulmonary artery on image 185 series 6. No
upper lobe filling defects identified.

The ascending thoracic aorta measures 46 mm.

Mediastinum/Nodes: No axillary supraclavicular adenopathy. No
pericardial fluid. Esophagus normal.

Lungs/Pleura: Bilateral small effusions, RIGHT greater than LEFT.
Mild bibasilar atelectasis. No pulmonary edema. No pneumonia.

Upper Abdomen: Limited view of the liver, kidneys, pancreas are
unremarkable. Normal adrenal glands.

Musculoskeletal: No aggressive osseous lesion.

Review of the MIP images confirms the above findings.
IMPRESSION: 1. Eccentric filling defect within the posterior lower lobe
pulmonary artery on the LEFT and RIGHT are most consistent with
pulmonary emboli. The findings have some features of subacute
pulmonary emboli.
2. Overall clot burden is minimal.  No RIGHT ventricular strain.
3. Bilateral small effusions, RIGHT greater LEFT.
4. Ascending thoracic aorta measures 46 mm. Ascending thoracic
aortic aneurysm. Recommend semi-annual imaging followup by CTA or
MRA and referral to cardiothoracic surgery if not already obtained.
This recommendation follows 2131
ACCF/AHA/AATS/ACR/ASA/SCA/TARLA/SHAH RIZAL/RUMA/OLOGE Guidelines for the
Diagnosis and Management of Patients With Thoracic Aortic Disease.
Circulation. 2131; 121: [id number]

## 2018-12-02 IMAGING — CT CT HEAD W/O CM
3 series · 14 of 47 positions shown, 16 images · non-contrast
Comparison: CT HEAD July 20, 2016

ADDENDUM:
Critical Value/emergent results were called by telephone at the time
of interpretation on 01/12/2017 at [DATE] to NP Walter Osvaldo Citati , who
verbally acknowledged these results.
CLINICAL DATA: Weakness. History of colon cancer, intracranial
hemorrhage, atrial fibrillation, diabetes, hypertension.

EXAM:
CT HEAD WITHOUT CONTRAST
TECHNIQUE: Contiguous axial images were obtained from the base of the skull
through the vertex without intravenous contrast.

[Series 3: head 5.0 h30s · axial · 0.44mm/px · z∈[+891,+1031]mm · 8 of 34 slices shown, 10 images]
[im 3/34  brain]
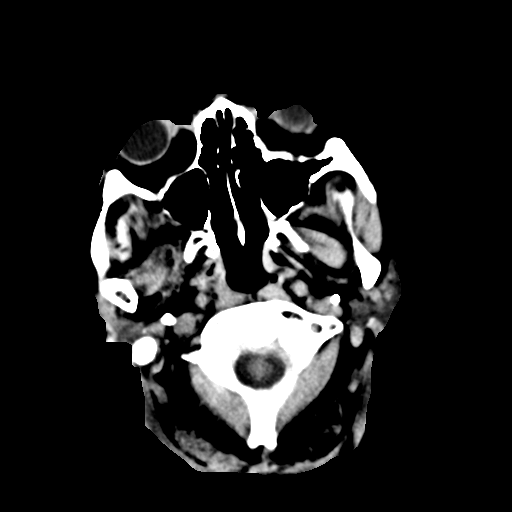
[im 3/34  bone]
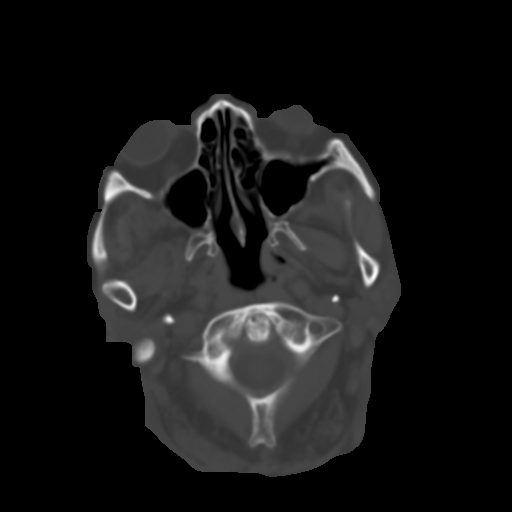
[im 7/34  brain]
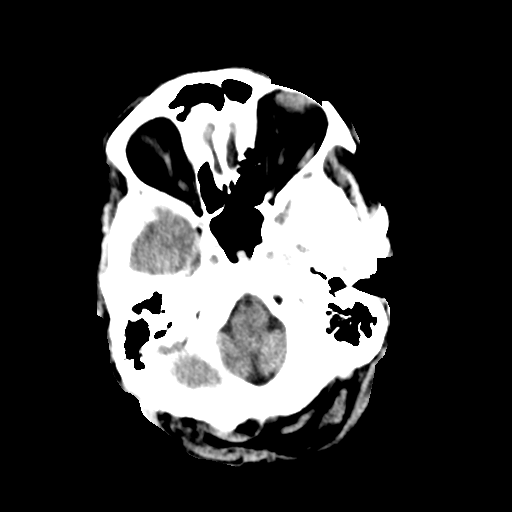
[im 11/34  brain]
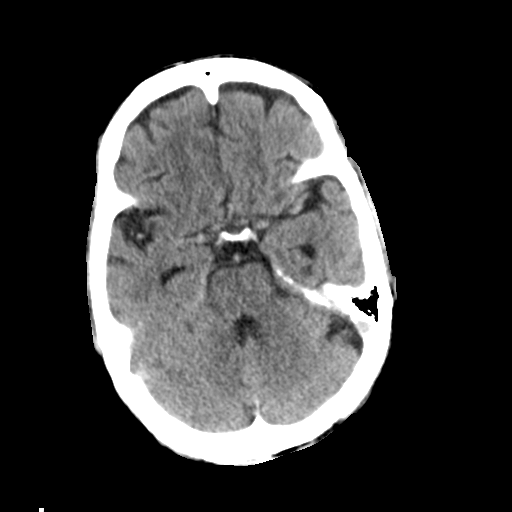
[im 15/34  brain]
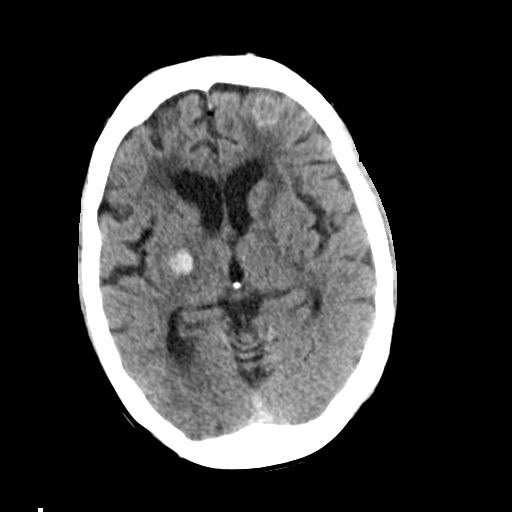
[im 19/34  brain]
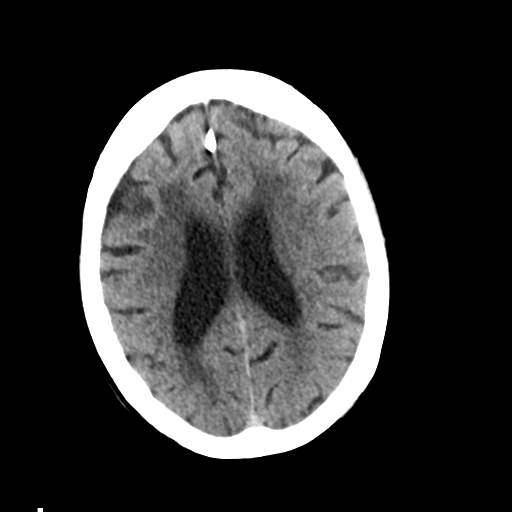
[im 19/34  bone]
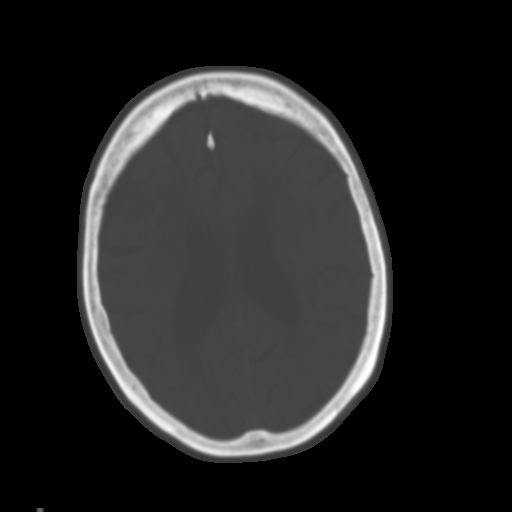
[im 23/34  brain]
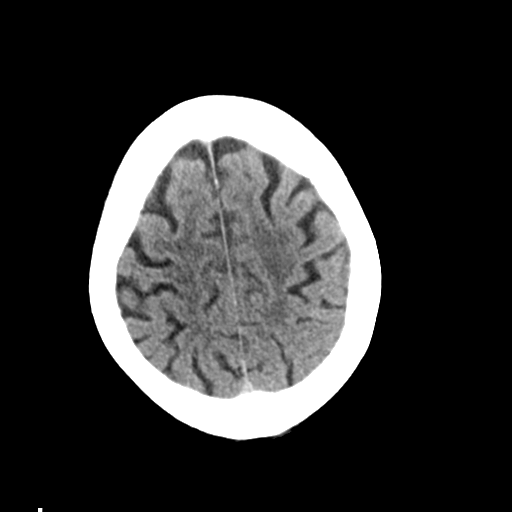
[im 27/34  brain]
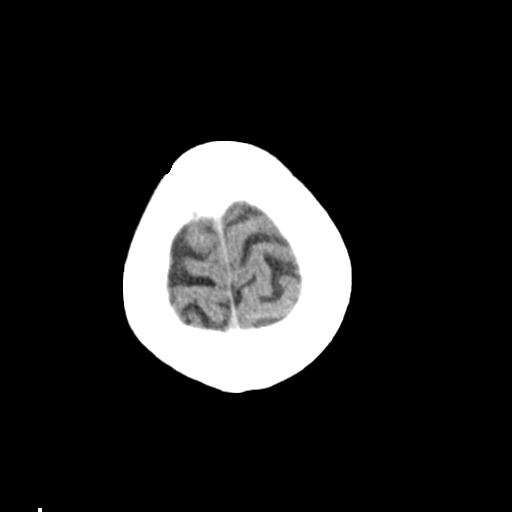
[im 31/34  brain]
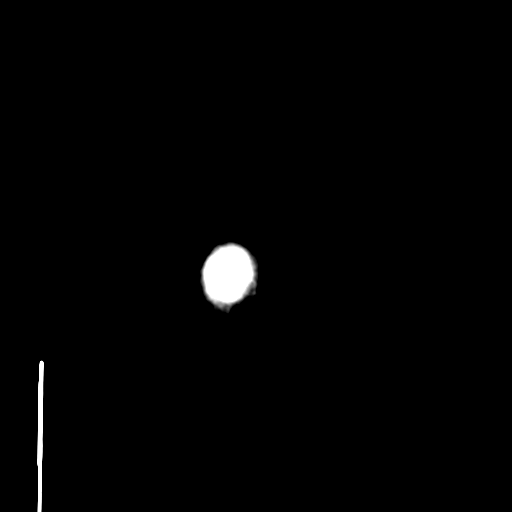

[Series 5: head 3.0 mpr cor · coronal · 0.30mm/px · 3 of 68 slices shown]
[im 23/68  brain]
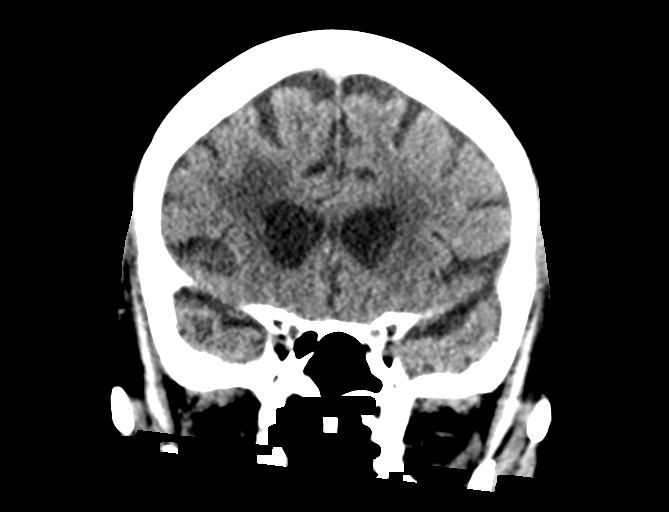
[im 30/68  brain]
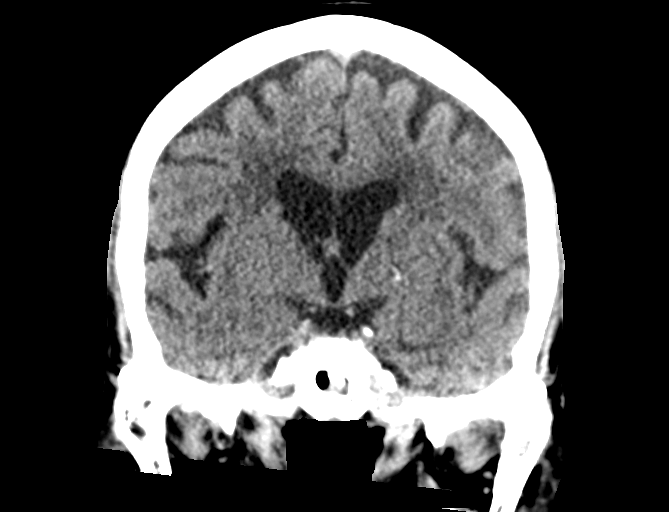
[im 38/68  brain]
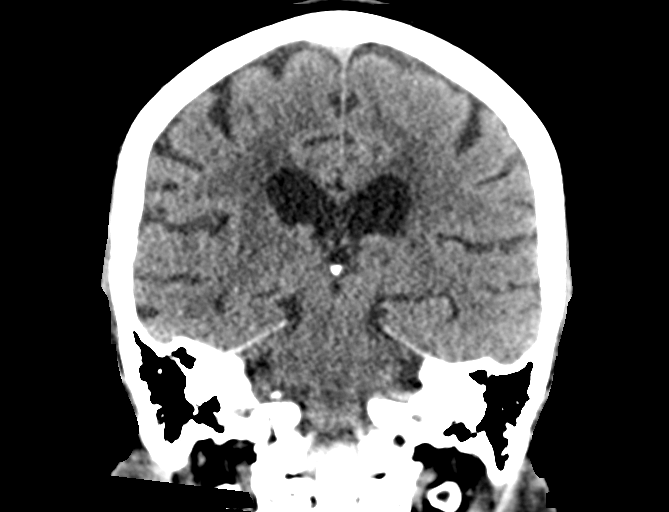

[Series 6: head 3.0 mpr sag · sagittal · 0.26mm/px · 3 of 55 slices shown]
[im 19/55  brain]
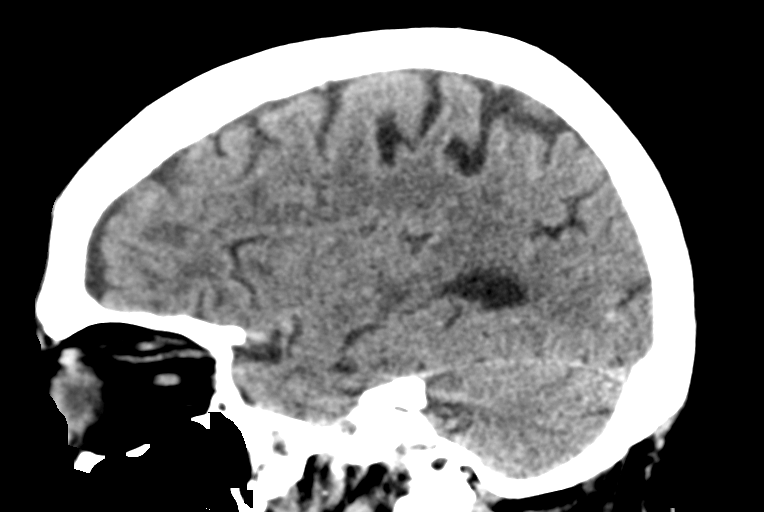
[im 28/55  brain]
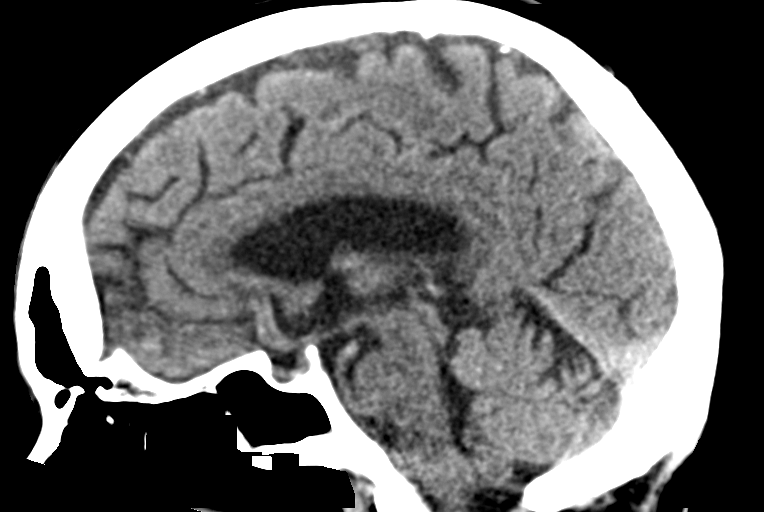
[im 37/55  brain]
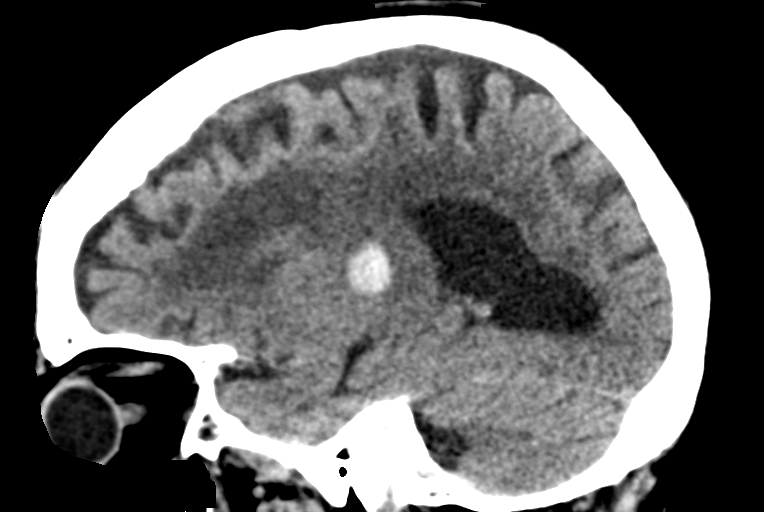

[14 of 47 positions shown; findings below may reference images not displayed]

FINDINGS: BRAIN: 12 x 13 x 14 mm (volume = 3377 mm^3) acute RIGHT basal
ganglia hemorrhage, minimal surrounding vasogenic edema. No
significant mass effect. Faintly dense 12 x 13 mm LEFT frontal
cortical to subcortical lesion, new from prior CT. Ventricles and
sulci are normal for patient's age. Confluent supratentorial white
matter hypodensities. Old basal ganglia and thalamus lacunar
infarcts. No acute large vascular territory infarct. No abnormal
extra-axial fluid collections.

VASCULAR: Moderate calcific atherosclerosis of the carotid siphons.

SKULL: No skull fracture. No significant scalp soft tissue swelling.

SINUSES/ORBITS: Chronic sphenoid sinusitis, hypoplastic RIGHT
sphenoid. Mastoid air cells are well aerated.The included ocular
globes and orbital contents are non-suspicious.

OTHER: None.
IMPRESSION: Acute 12 x 13 x 14 mm RIGHT basal ganglia hemorrhage without
significant mass effect.

12 x 13 mm faintly dense LEFT frontal lobe lesion could reflect
subacute hemorrhage or, metastasis. MRI of the brain with contrast
is recommended.

Moderate to severe chronic small vessel ischemic disease and old
lacunar infarcts.

## 2018-12-02 IMAGING — DX DG CHEST 1V PORT
1 series · 1 of 1 positions shown · non-contrast
Comparison: 08/22/2016 chest radiograph.

CLINICAL DATA: Chest pain and dyspnea

EXAM:
PORTABLE CHEST 1 VIEW

[chest ap]
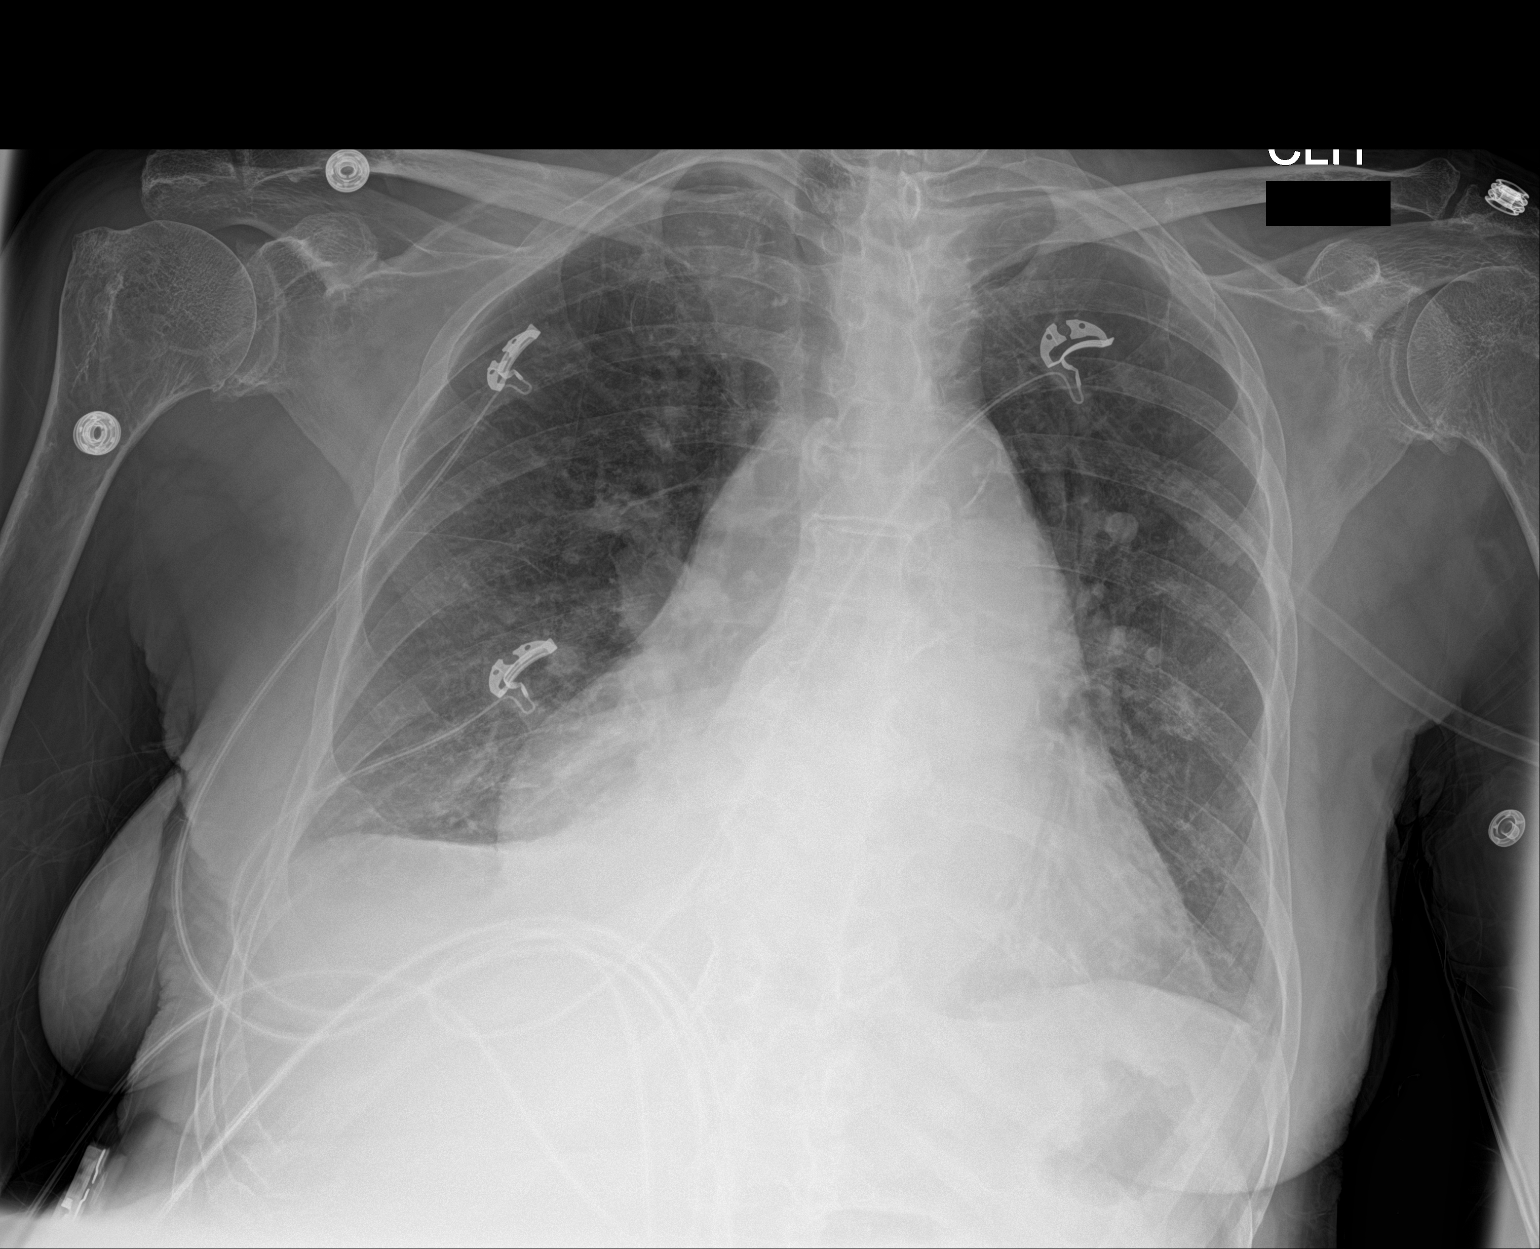

[1 of 1 positions shown; findings below may reference images not displayed]

FINDINGS: Stable cardiomediastinal silhouette with mild to moderate
cardiomegaly and aortic atherosclerosis. No pneumothorax. No pleural
effusion. No overt pulmonary edema. Mild curvilinear bibasilar
opacities.
IMPRESSION: 1. Stable cardiomegaly without overt pulmonary edema .
2. Hazy curvilinear bibasilar lung opacities, favor scarring or
atelectasis.

## 2018-12-02 IMAGING — CT CT ANGIO CHEST
2 of 8 series · 18 of 46 positions shown · IV contrast (OMNI)
Comparison: CT scan of August 23, 2016.

CLINICAL DATA: Chest pain, shortness of breath.

EXAM:
CT ANGIOGRAPHY CHEST WITH CONTRAST
TECHNIQUE: Multidetector CT imaging of the chest was performed using the
standard protocol during bolus administration of intravenous
contrast. Multiplanar CT image reconstructions and MIPs were
obtained to evaluate the vascular anatomy.
CONTRAST:  100 mL of Isovue 370 intravenously.

[Series 6: thins · axial · 0.68mm/px · z∈[+106,+360]mm · 15 of 280 slices shown]
[im 13/280  lung]
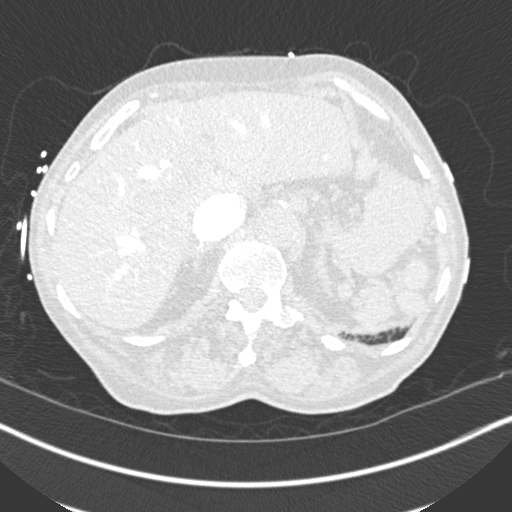
[im 39/280  soft-tissue]
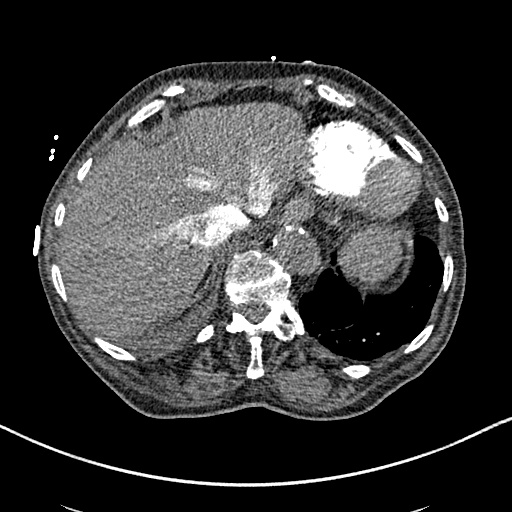
[im 51/280  lung]
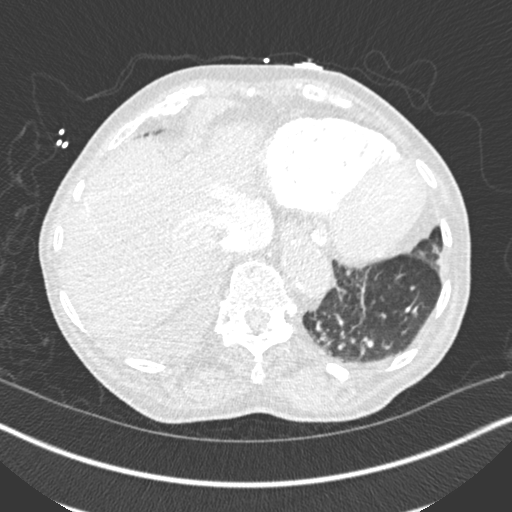
[im 64/280  soft-tissue]
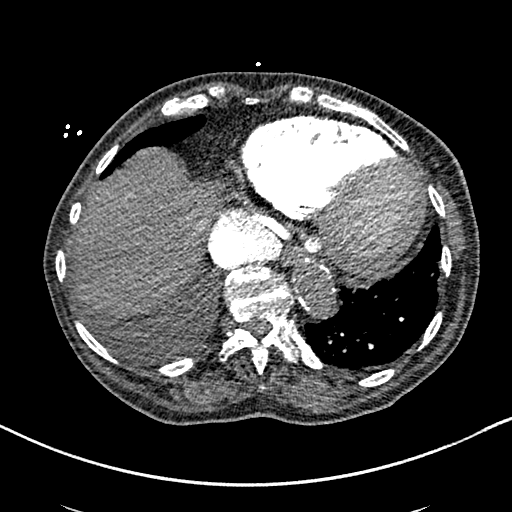
[im 89/280  lung]
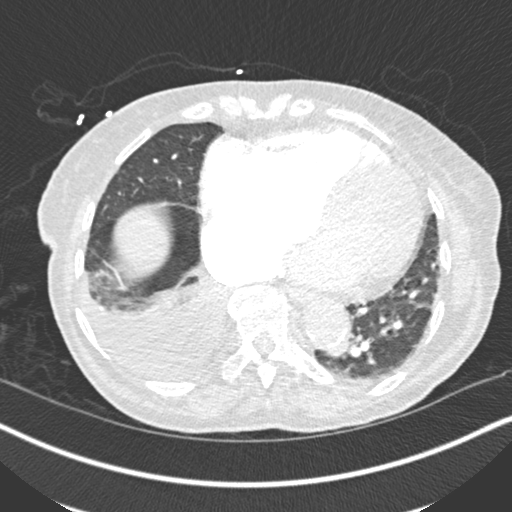
[im 102/280  soft-tissue]
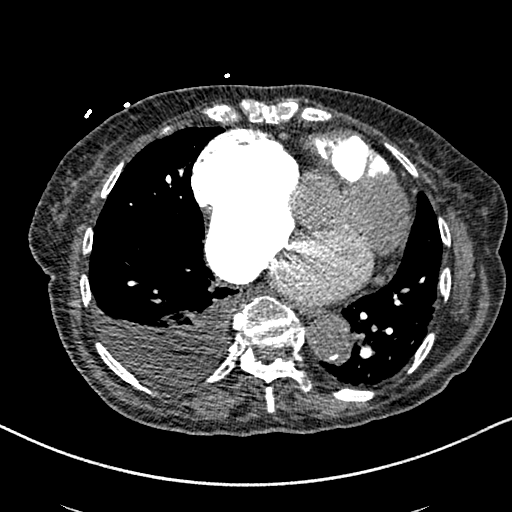
[im 127/280  lung]
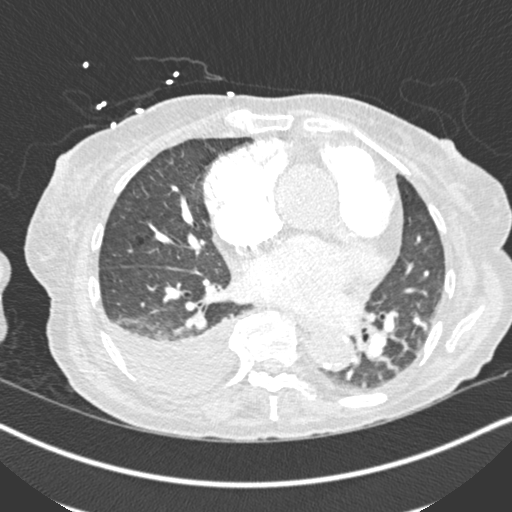
[im 140/280  soft-tissue]
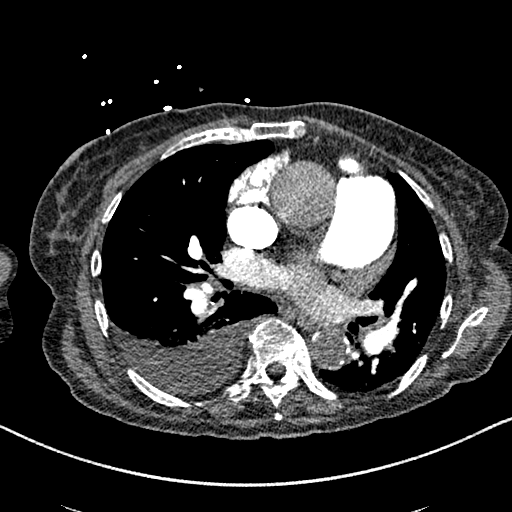
[im 153/280  lung]
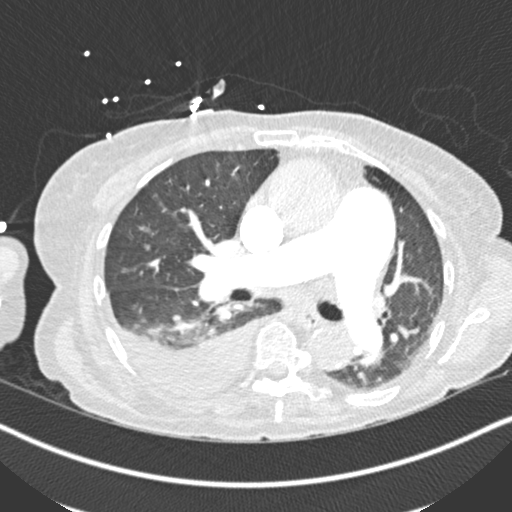
[im 178/280  soft-tissue]
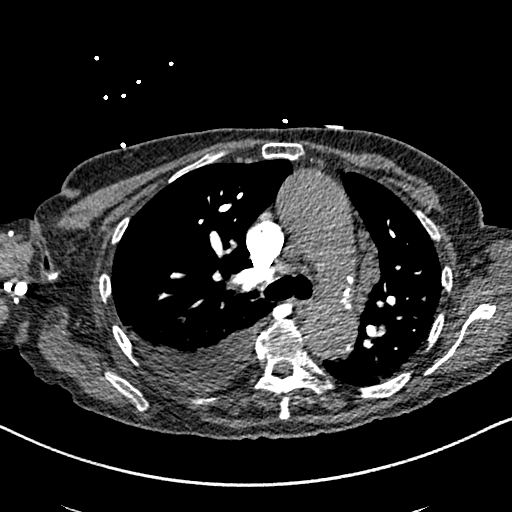
[im 191/280  lung]
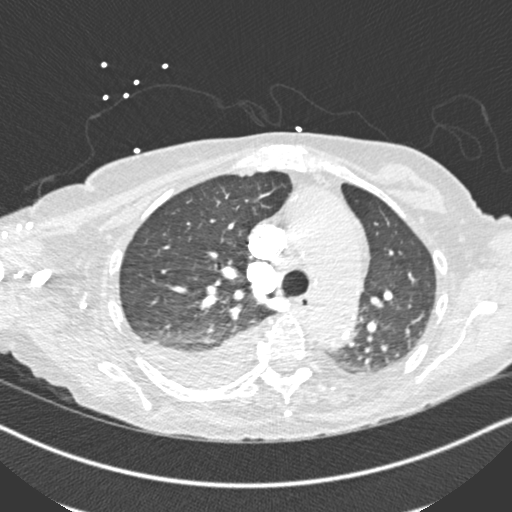
[im 216/280  soft-tissue]
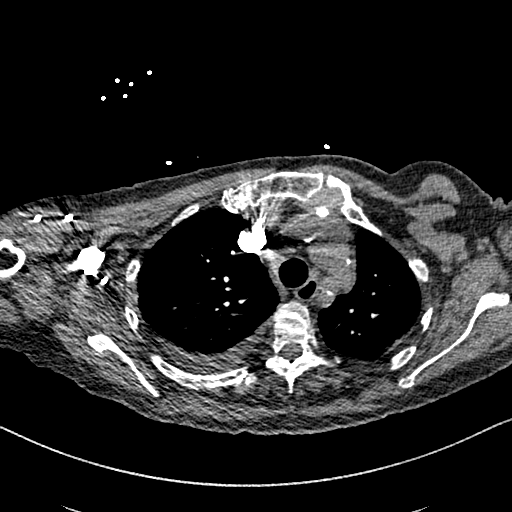
[im 229/280  lung]
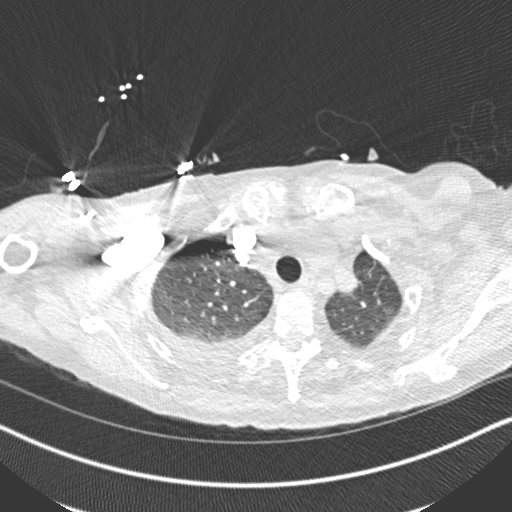
[im 241/280  soft-tissue]
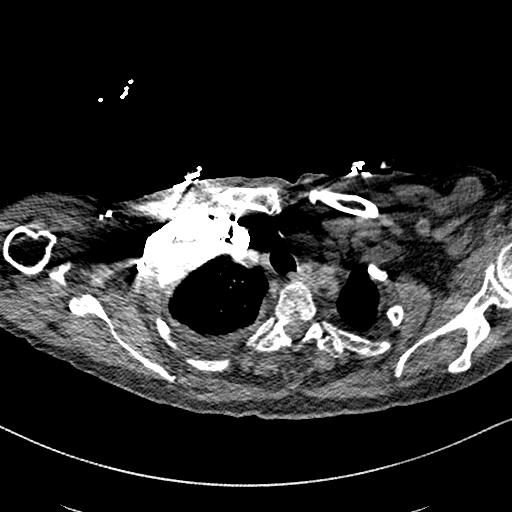
[im 267/280  lung]
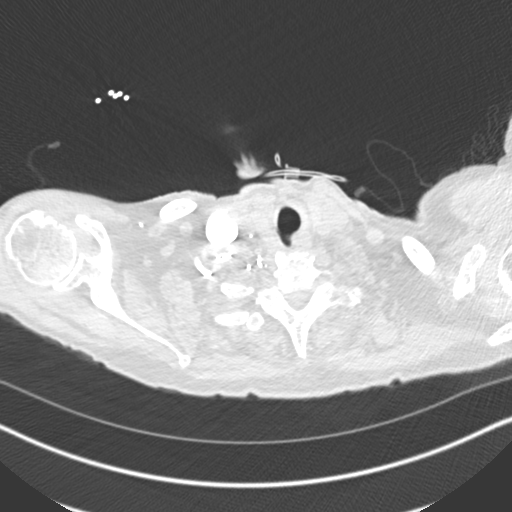

[Series 8: coronal mpr · coronal · 0.55mm/px · 3 of 151 slices shown]
[im 38/151  soft-tissue]
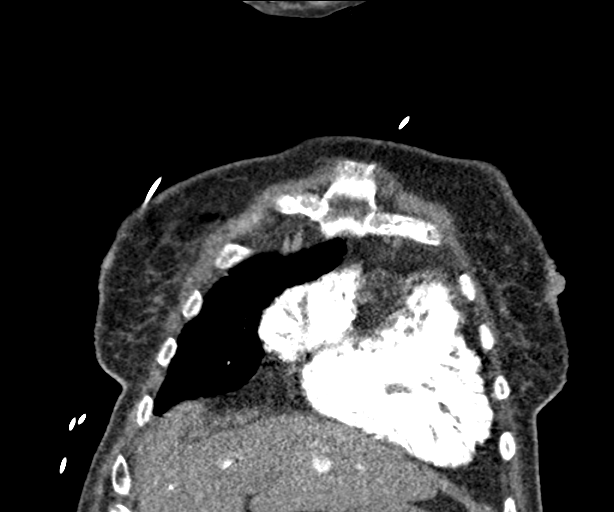
[im 76/151  soft-tissue]
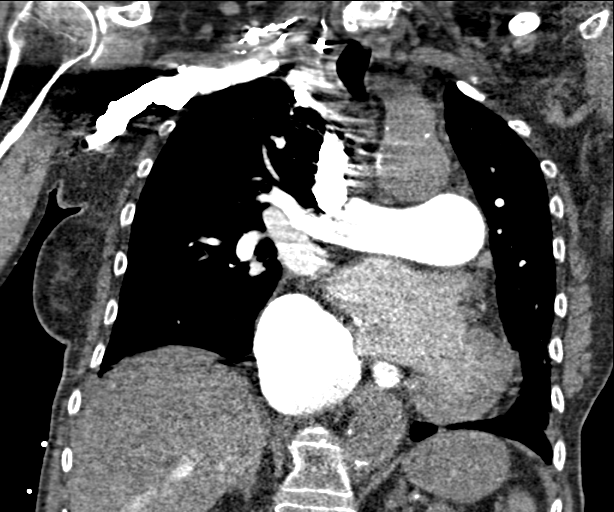
[im 113/151  soft-tissue]
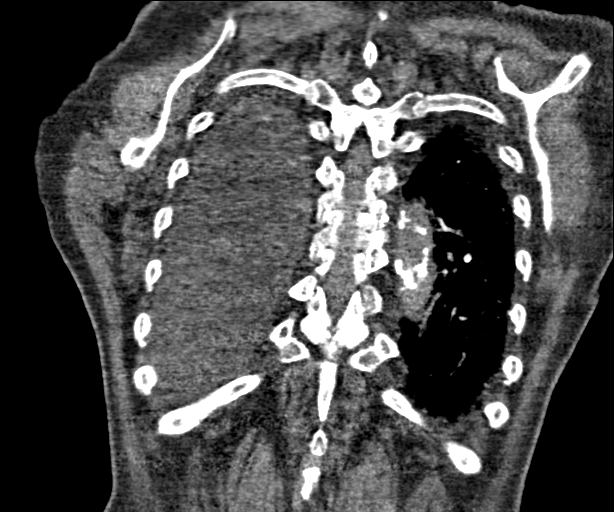

[18 of 46 positions shown; findings below may reference images not displayed]

FINDINGS: Cardiovascular: 4.8 cm ascending thoracic aortic aneurysm is noted.
Atherosclerosis of thoracic aorta is noted. Filling defect is again
noted in lower lobe branch of right pulmonary artery, and it is
uncertain if this represents chronic or recurrent acute thrombus.
Mild cardiomegaly is noted. No pericardial effusion is noted. Stable
enlargement of pulmonary artery is noted suggesting pulmonary artery
hypertension.

Mediastinum/Nodes: No enlarged mediastinal, hilar, or axillary lymph
nodes. Thyroid gland, trachea, and esophagus demonstrate no
significant findings.

Lungs/Pleura: No pneumothorax is noted. Moderate right pleural
effusion is noted with adjacent subsegmental atelectasis. Left lung
is clear.

Upper Abdomen: Left lung is clear.

Musculoskeletal: No chest wall abnormality. No acute or significant
osseous findings.

Review of the MIP images confirms the above findings.
IMPRESSION: Filling defect is again noted in lower lobe branch of right
pulmonary artery ; it is uncertain if this represents chronic or
recurrent acute pulmonary embolus. Critical Value/emergent results
were called by telephone at the time of interpretation on 01/11/2017
at [DATE] to Dr. MIFAHARU DOTTE COMME , who verbally acknowledged these
results.

Stable enlargement of pulmonary artery suggesting pulmonary artery
hypertension.

Moderate right pleural effusion is noted with adjacent subsegmental
atelectasis.

4.8 cm ascending thoracic aortic aneurysm is noted. Ascending
thoracic aortic aneurysm. Recommend semi-annual imaging followup by
CTA or MRA and referral to cardiothoracic surgery if not already
obtained. This recommendation follows 1313
ACCF/AHA/AATS/ACR/ASA/SCA/ESBIETO/NYAUPANE/WANAKA/CHUCK Guidelines for the
Diagnosis and Management of Patients With Thoracic Aortic Disease.
Circulation. 1313; 121: [id number].

Aortic Atherosclerosis ([id number]-[id number].C).

## 2018-12-03 IMAGING — MR MR HEAD WO/W CM
10 of 13 series · 34 of 48 positions shown · IV contrast (Y MH)
Comparison: None.

CLINICAL DATA: Followup hemorrhage. History of colon cancer,
hypertension, atrial fibrillation, dementia.

EXAM:
MRI HEAD WITHOUT AND WITH CONTRAST
TECHNIQUE: Multiplanar, multiecho pulse sequences of the brain and surrounding
structures were obtained without and with intravenous contrast.
CONTRAST:  14mL MULTIHANCE GADOBENATE DIMEGLUMINE 529 MG/ML IV SOLN

[Series 4: DWI · axial · 3.0mm · 0.94mm/px · z∈[-128,+7]mm · 9 of 96 slices shown (1 of 2)]
[im 1/96]
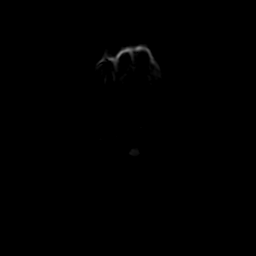
[im 12/96]
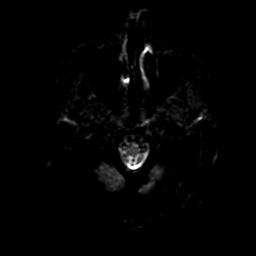
[im 24/96]
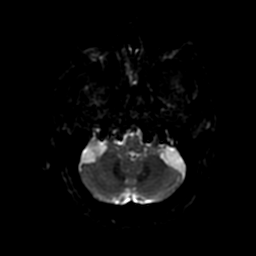
[im 36/96]
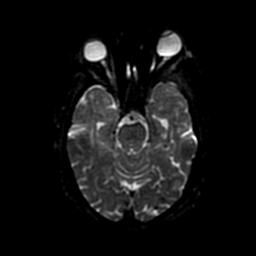
[im 48/96]
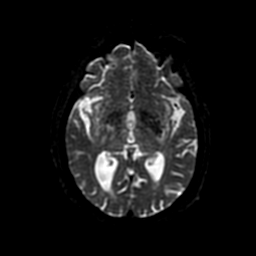
[im 60/96]
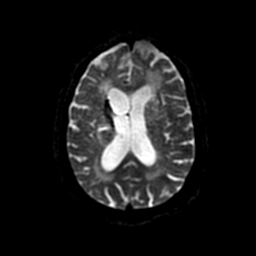
[im 72/96]
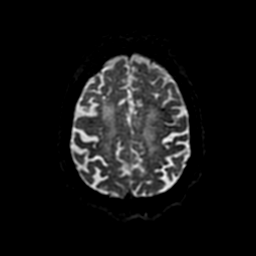
[im 84/96]
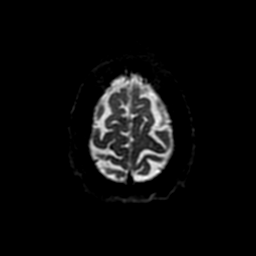
[im 96/96]
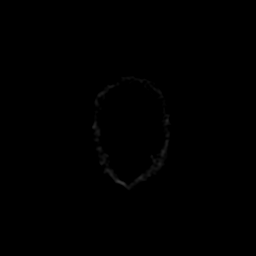

[Series 5: T2 · axial · 5.0mm · 0.47mm/px · z∈[-127,+5]mm · 2 of 24 slices shown (1 of 2)]
[im 1/24]
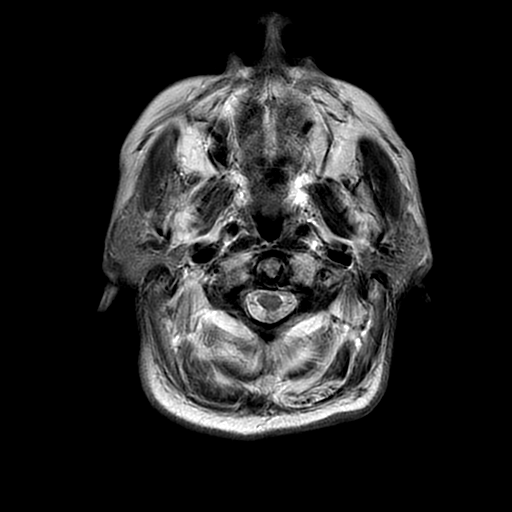
[im 24/24]
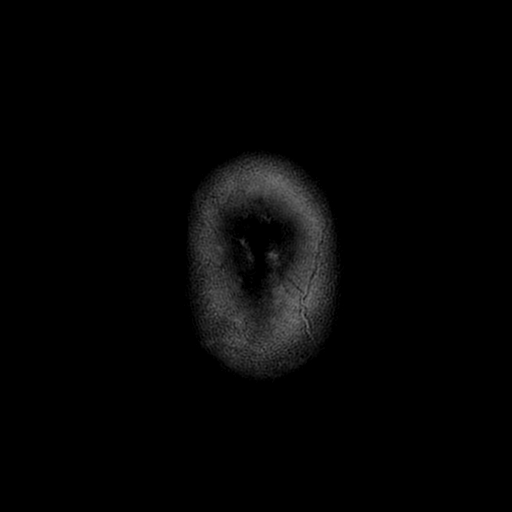

[Series 6: FLAIR · axial · 5.0mm · 0.47mm/px · z∈[-127,+5]mm · 2 of 24 slices shown (1 of 2)]
[im 1/24]
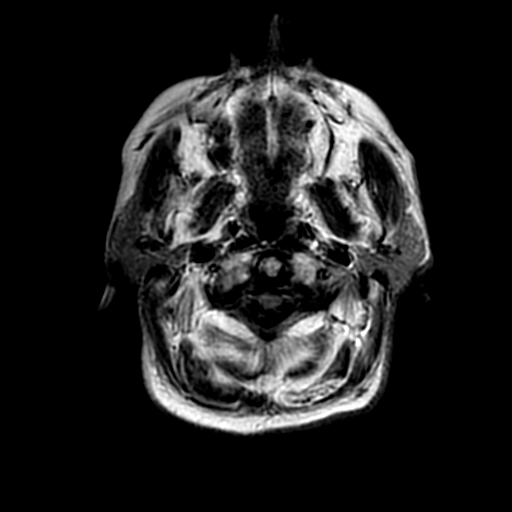
[im 24/24]
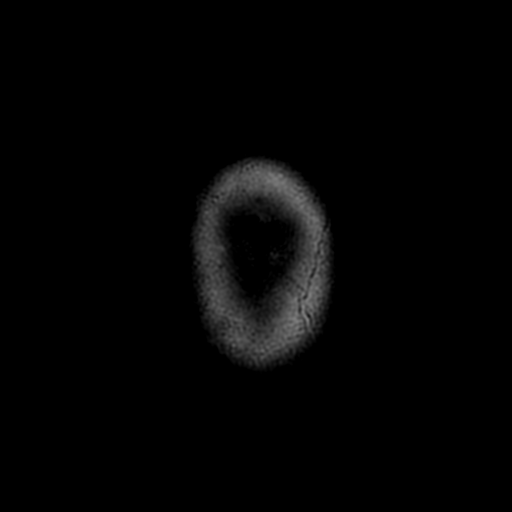

[Series 8: FLAIR · sagittal · 5.0mm · 0.47mm/px · 2 of 23 slices shown (2 of 2)]
[im 1/23]
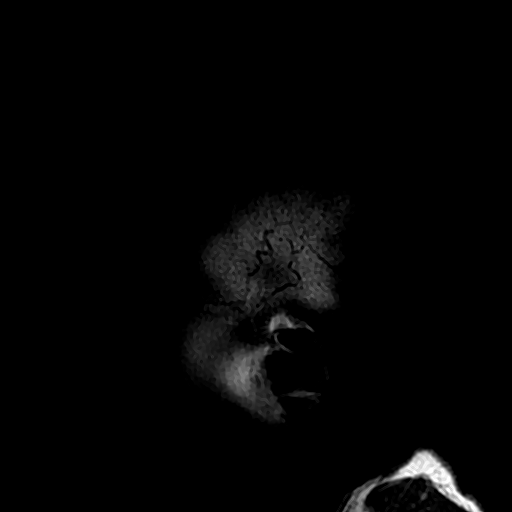
[im 23/23]
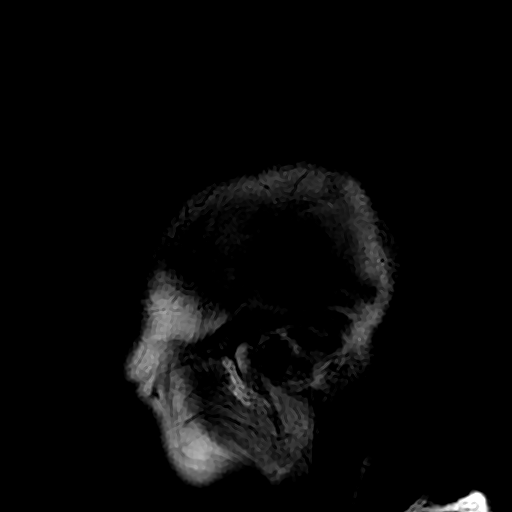

[Series 10: DWI · coronal · 4.0mm · 0.94mm/px · 6 of 72 slices shown (2 of 2)]
[im 1/72]
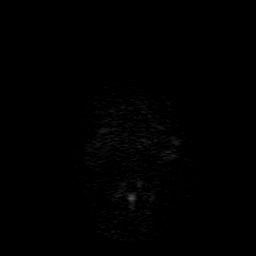
[im 15/72]
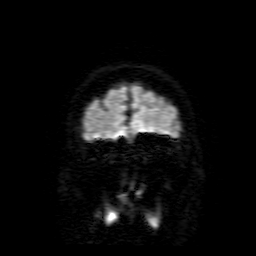
[im 29/72]
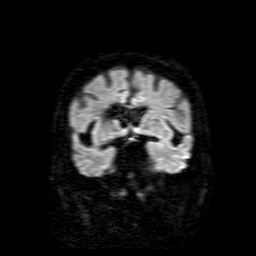
[im 43/72]
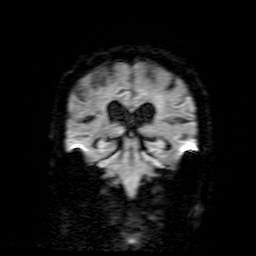
[im 57/72]
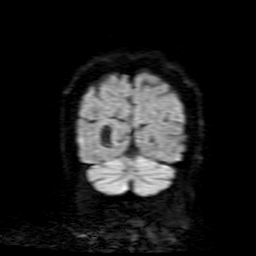
[im 72/72]
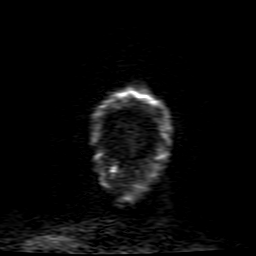

[Series 12: T2 · coronal · 5.0mm · 0.47mm/px · 2 of 30 slices shown (2 of 2)]
[im 1/30]
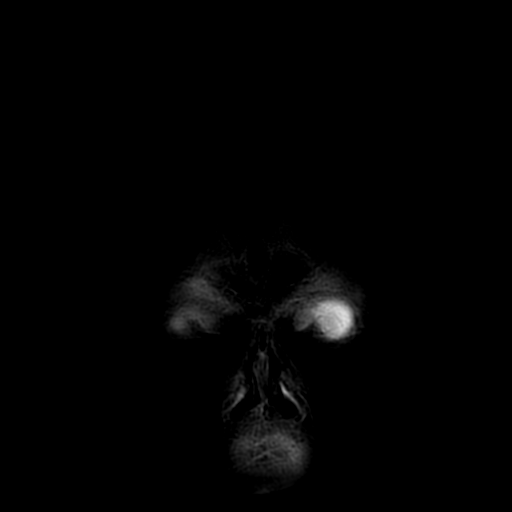
[im 15/30]
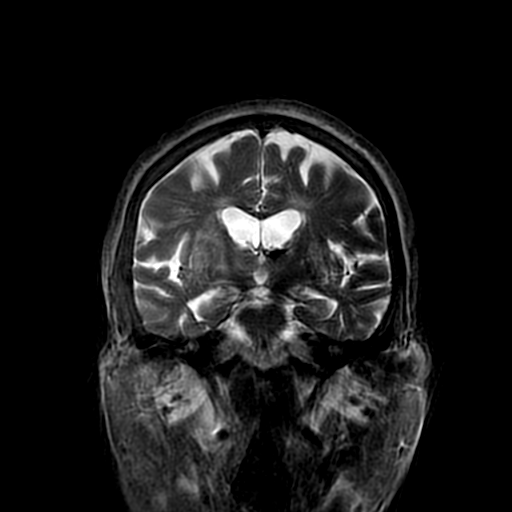

[Series 13: T1 post-contrast · axial · 5.0mm · 0.47mm/px · z∈[-127,+5]mm · 2 of 24 slices shown]
[im 1/24]
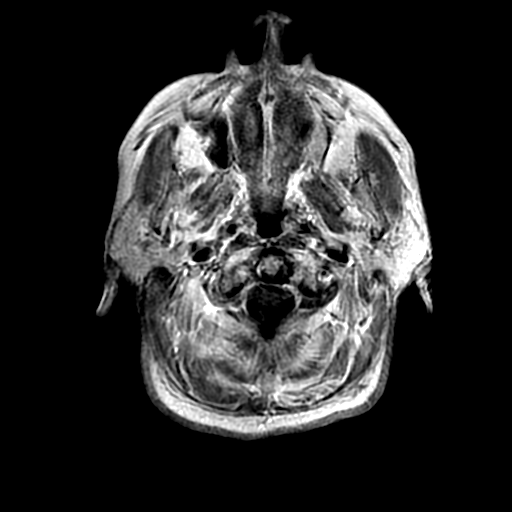
[im 24/24]
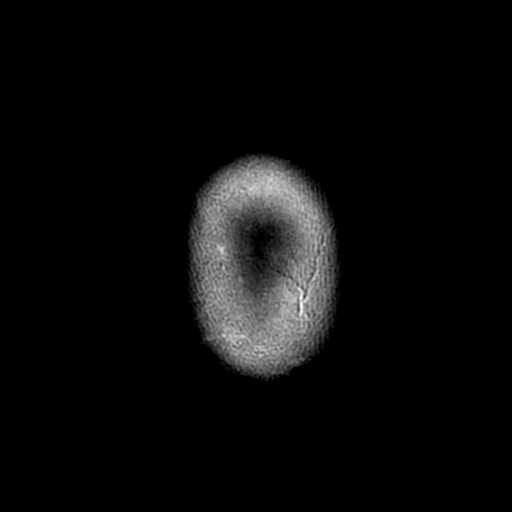

[Series 15: FLAIR post-contrast · sagittal · 5.0mm · 0.47mm/px · 2 of 23 slices shown]
[im 1/23]
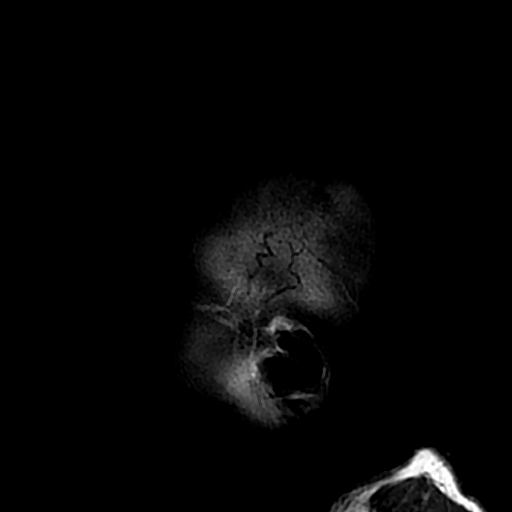
[im 23/23]
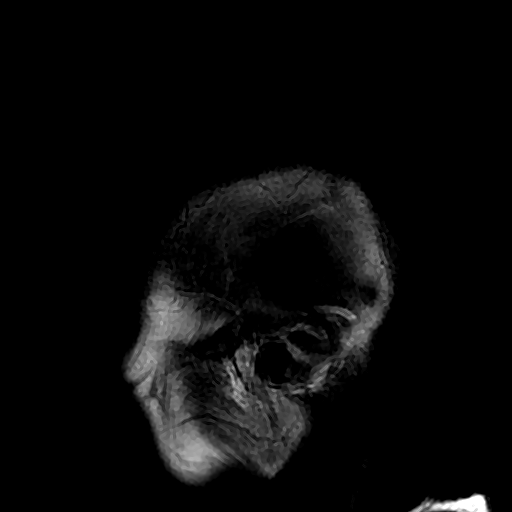

[Series 450: ADC · axial · 3.0mm · 0.94mm/px · z∈[-128,+7]mm · 4 of 48 slices shown (1 of 2)]
[im 1/48]
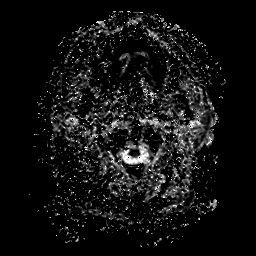
[im 16/48]
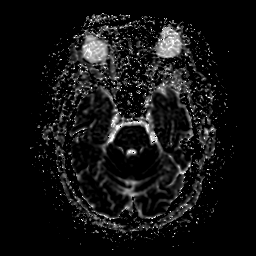
[im 32/48]
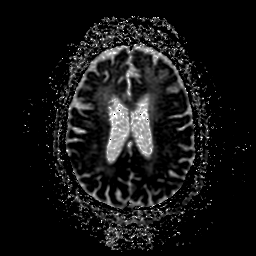
[im 48/48]
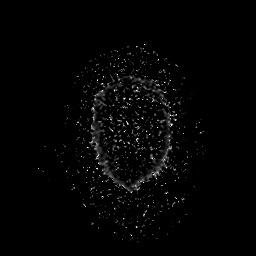

[Series 1050: ADC · coronal · 4.0mm · 0.94mm/px · 3 of 36 slices shown (2 of 2)]
[im 1/36]
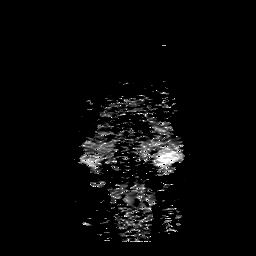
[im 18/36]
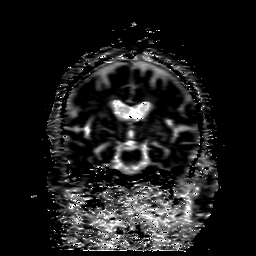
[im 36/36]
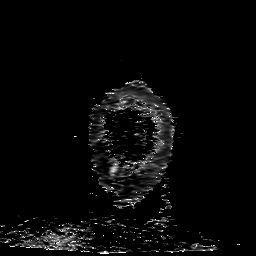

[34 of 48 positions shown; findings below may reference images not displayed]

FINDINGS: Moderately motion degraded examination, fast sequences utilized.

INTRACRANIAL CONTENTS: Susceptibility artifact RIGHT basal ganglia
corresponding to known hemorrhage with favoring of T2 bright
vasogenic edema. Numerous central and peripheral foci of
susceptibility artifact. Confluent susceptibility artifact RIGHT
basal ganglia, LEFT thalamus and to lesser extent LEFT basal
ganglia. 14 x 10 mm LEFT frontal lobe lesion with intrinsic T1
shortening, minimal enhancement. Ventricles and sulci are overall
normal for patient's age, mild ex vacuo dilatation RIGHT lateral
ventricle associated with RIGHT basal ganglia infarct. Patchy to
confluent supratentorial white matter T2 hyperintensities. Old tiny
LEFT cerebellar infarct. No abnormal extra-axial fluid collections
or abnormal extra-axial enhancement. No midline shift, mass effect
or suspicious enhancement .

VASCULAR: Normal major intracranial vascular flow voids present at
skull base.

SKULL AND UPPER CERVICAL SPINE: No abnormal sellar expansion. No
suspicious calvarial bone marrow signal. Craniocervical junction
maintained.

SINUSES/ORBITS: The mastoid air-cells and included paranasal sinuses
are well-aerated.The included ocular globes and orbital contents are
non-suspicious. Status post bilateral ocular lens implants.

OTHER: None.
IMPRESSION: Moderately motion degraded examination. Acute small RIGHT basal
ganglia hemorrhage without mass effect.

Subacute LEFT frontal lobe 14 x 10 mm hematoma. Extensive
susceptibility artifact compatible with chronic hypertension though,
there may be compartment amyloid angiopathy.

Old hemorrhagic RIGHT basal ganglia infarct. Moderate to severe
chronic small vessel ischemic disease.

## 2019-04-11 IMAGING — CR DG CHEST 2V
2 series · 2 of 2 positions shown · non-contrast
Comparison: 01/11/2017.

CLINICAL DATA: RIGHT-sided chest pain began 3 days ago.

EXAM:
CHEST  2 VIEW

[chest ap]
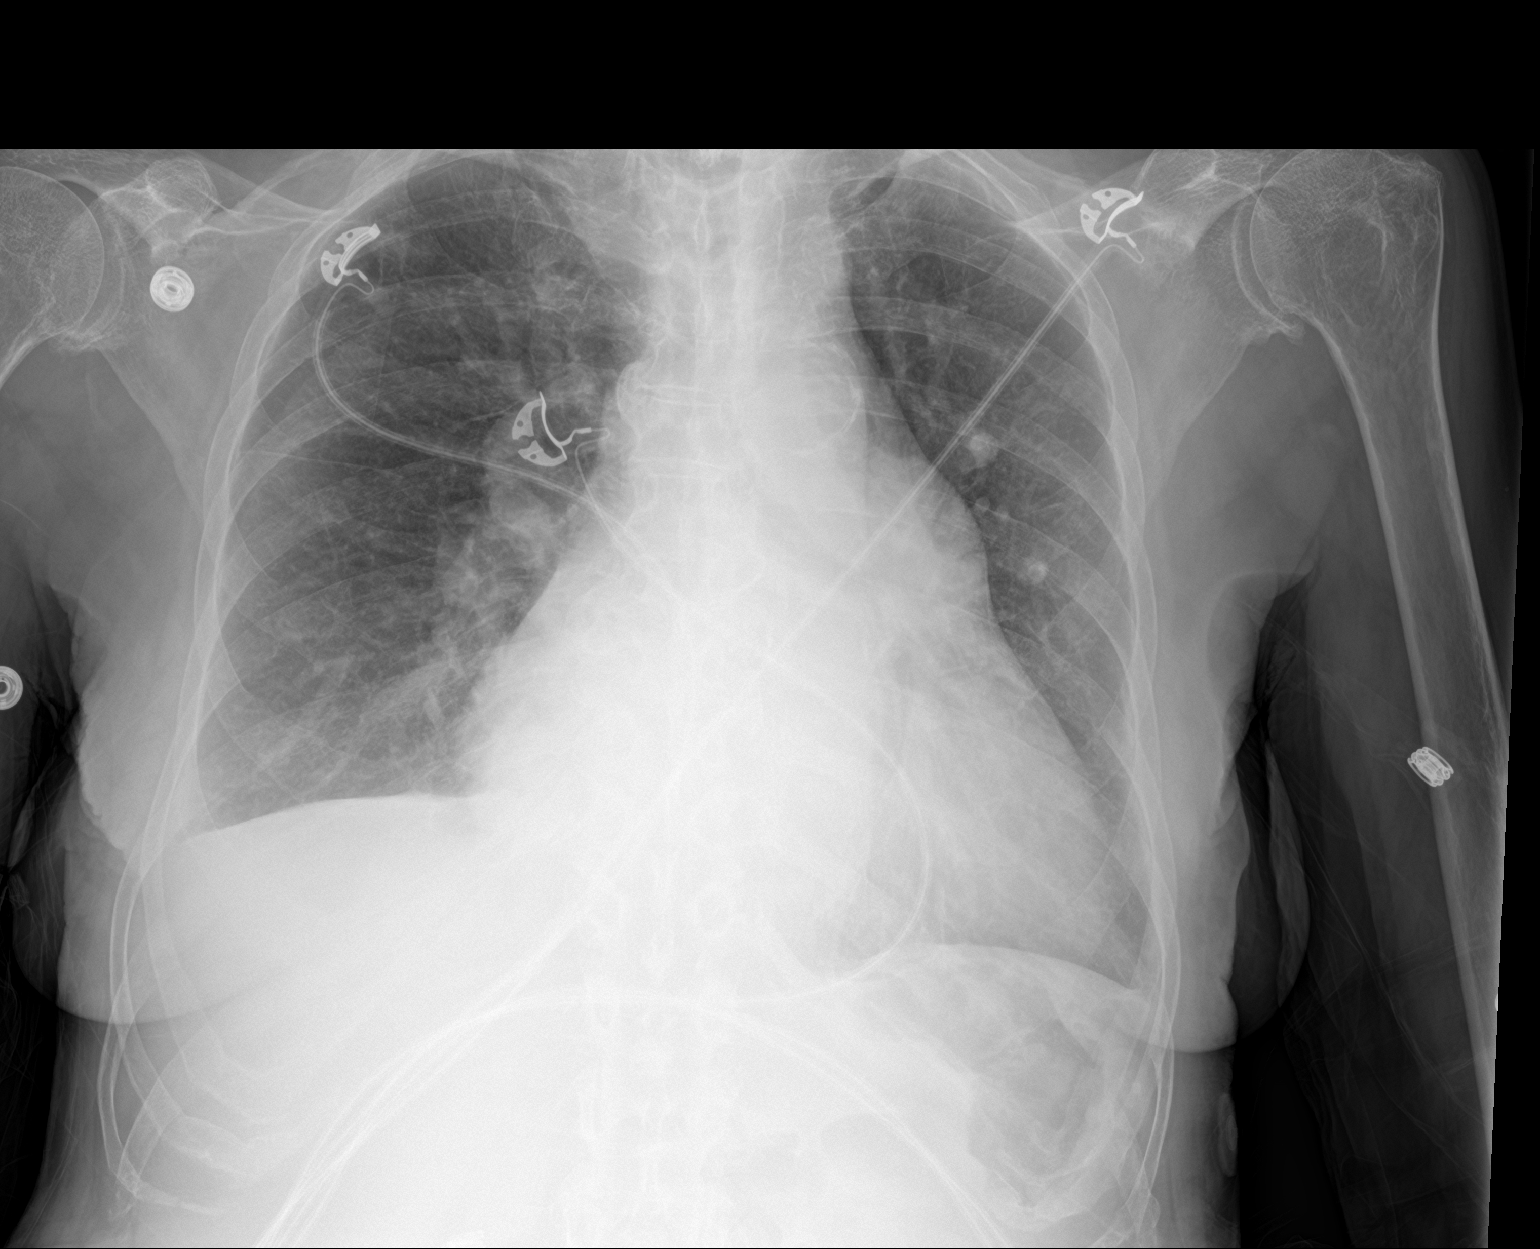

[chest lat]
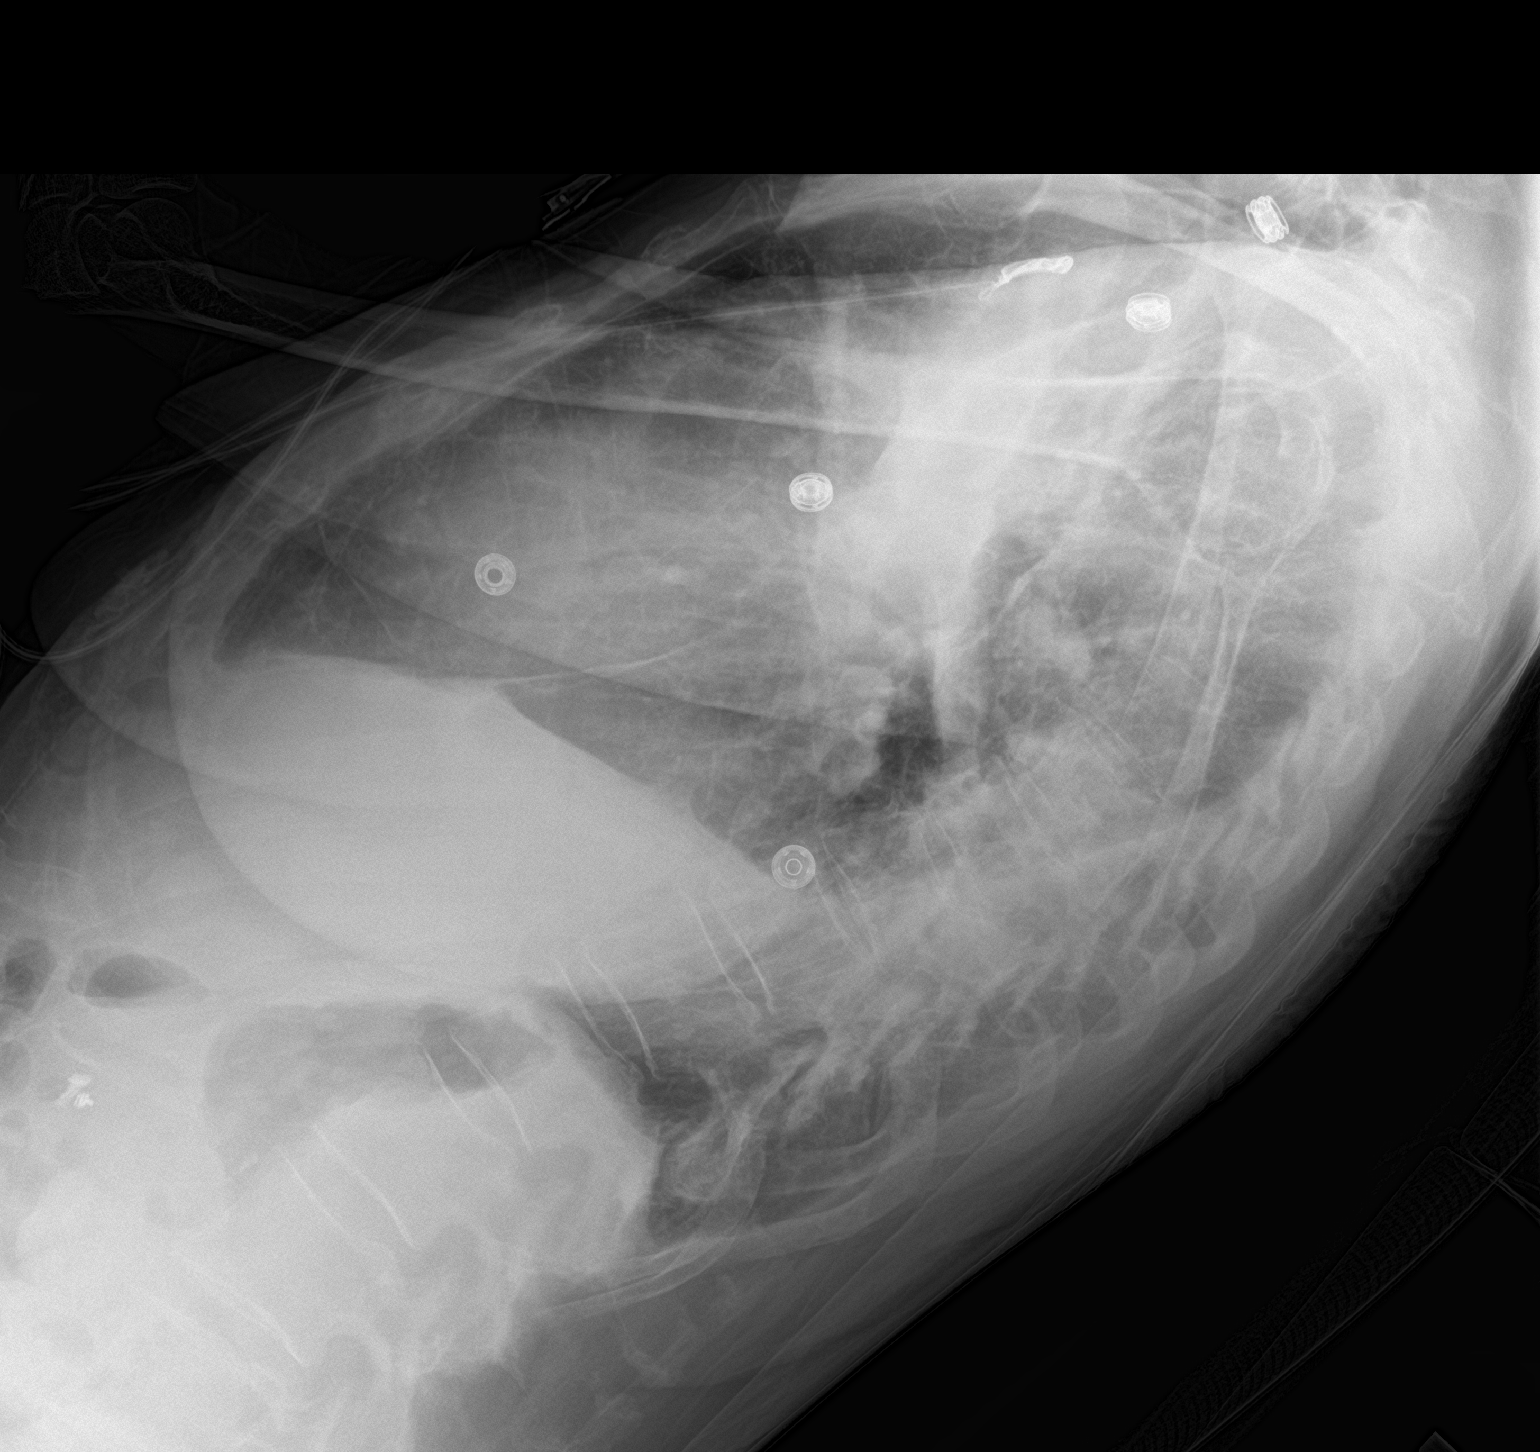

[2 of 2 positions shown; findings below may reference images not displayed]

FINDINGS: Cardiomegaly. RIGHT pleural effusion. No consolidation or pulmonary
edema. No pneumothorax. Thoracic atherosclerosis. Similar appearance
to priors. No acute osseous findings.
IMPRESSION: RIGHT pleural effusion similar to priors. Cardiomegaly. No
consolidation or edema.

## 2019-04-13 IMAGING — CT CT HEAD W/O CM
4 series · 16 of 47 positions shown, 18 images · non-contrast
Comparison: Brain MRI 01/12/2017

CLINICAL DATA: Weakness.  History of colon cancer.

EXAM:
CT HEAD WITHOUT CONTRAST
TECHNIQUE: Contiguous axial images were obtained from the base of the skull
through the vertex without intravenous contrast.

[Series 3: head without · axial · non-contrast · 0.43mm/px · z∈[-93,+22]mm · 7 of 31 slices shown, 9 images]
[im 4/31  brain]
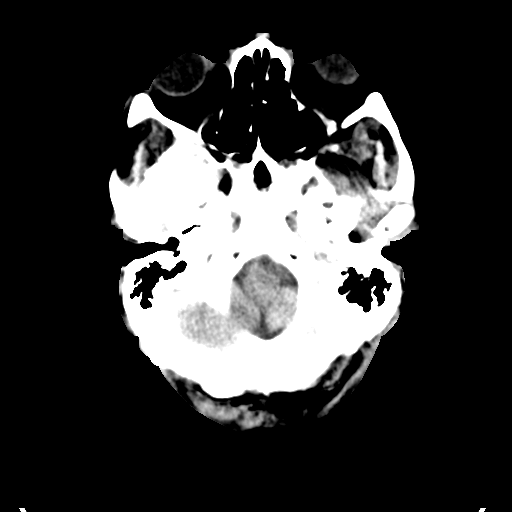
[im 4/31  bone]
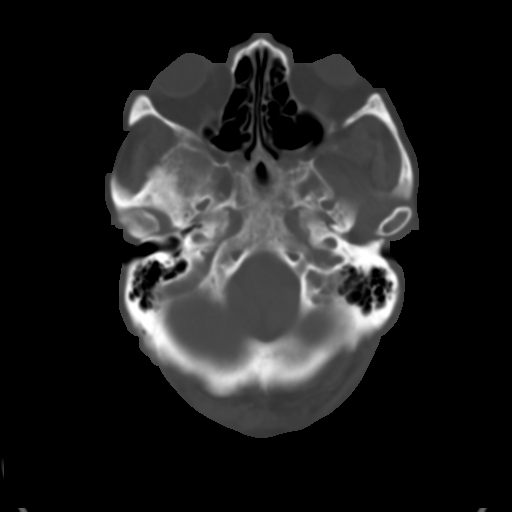
[im 8/31  brain]
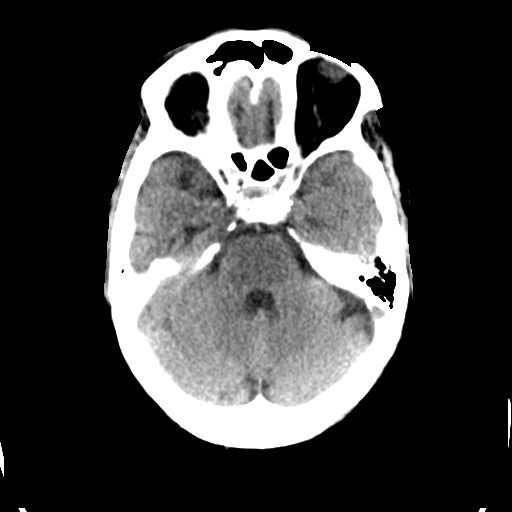
[im 12/31  brain]
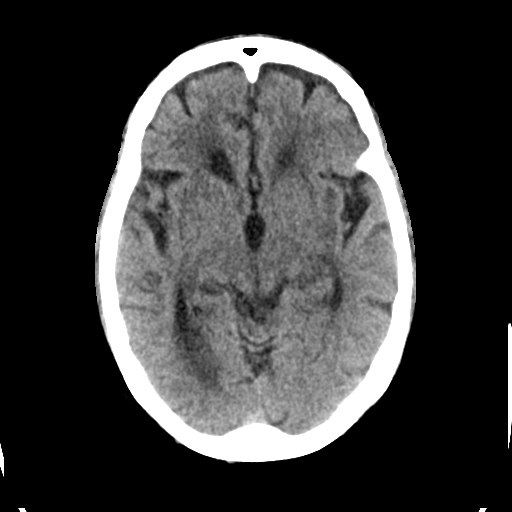
[im 16/31  brain]
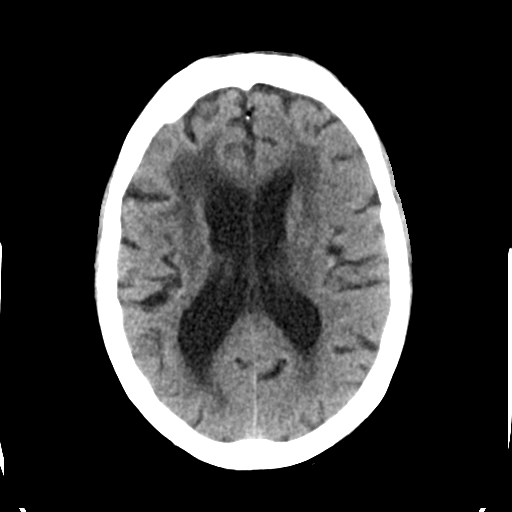
[im 19/31  brain]
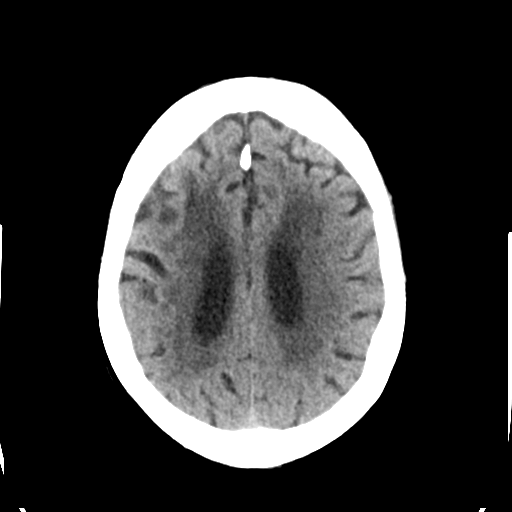
[im 19/31  bone]
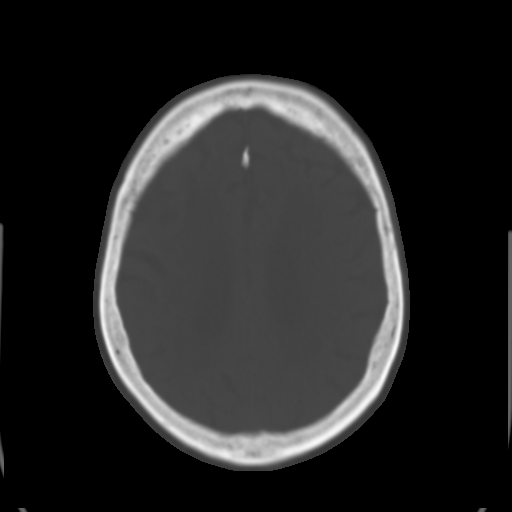
[im 23/31  brain]
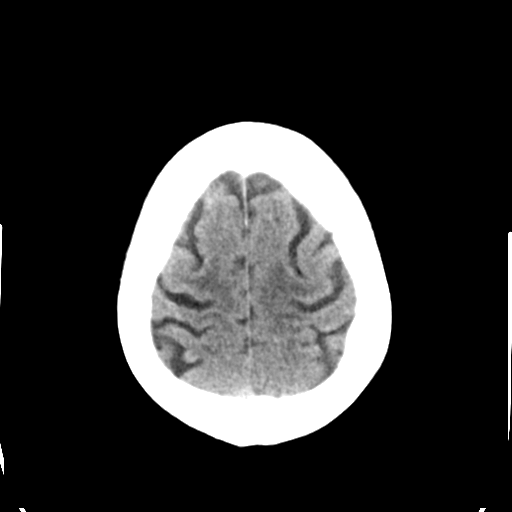
[im 27/31  brain]
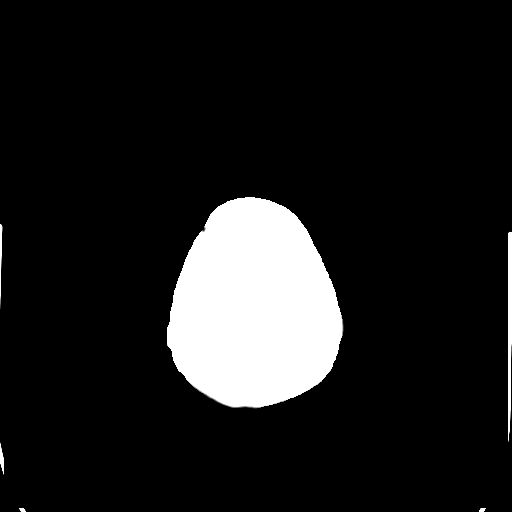

[Series 4: head bone · axial · 0.43mm/px · z∈[-94,-64]mm · 3 of 77 slices shown]
[im 8/77  bone]
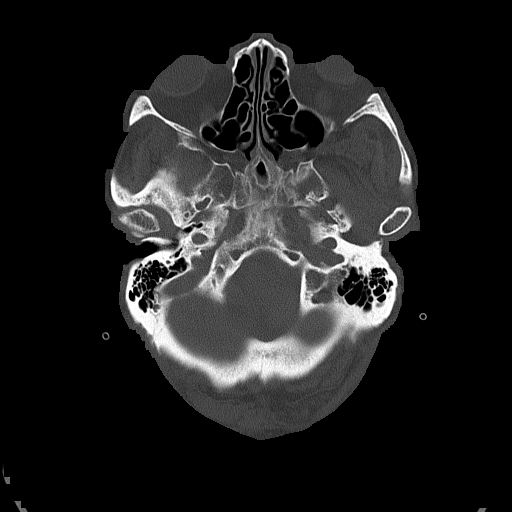
[im 16/77  bone]
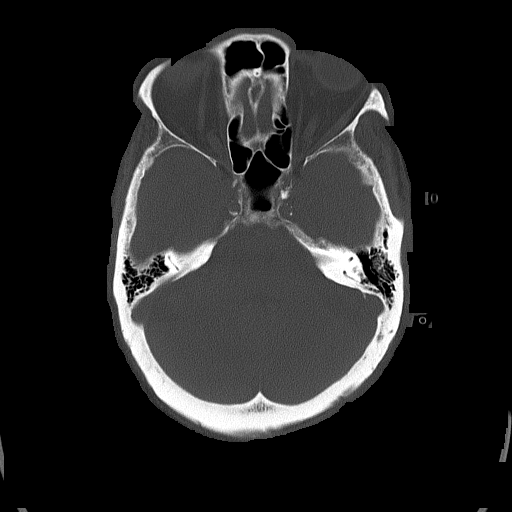
[im 23/77  bone]
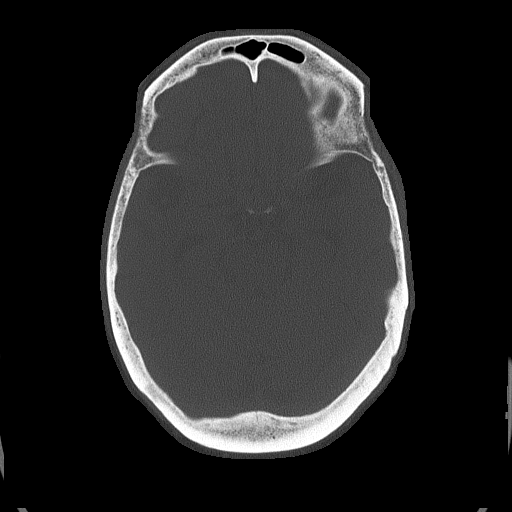

[Series 5: head without cor · coronal · non-contrast · 0.30mm/px · 3 of 65 slices shown]
[im 22/65  brain]
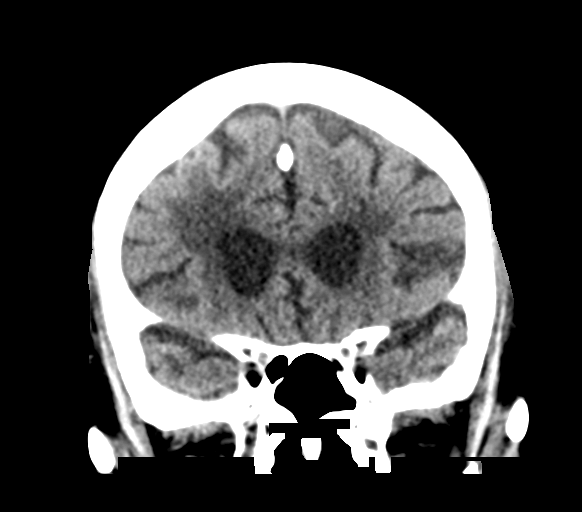
[im 29/65  brain]
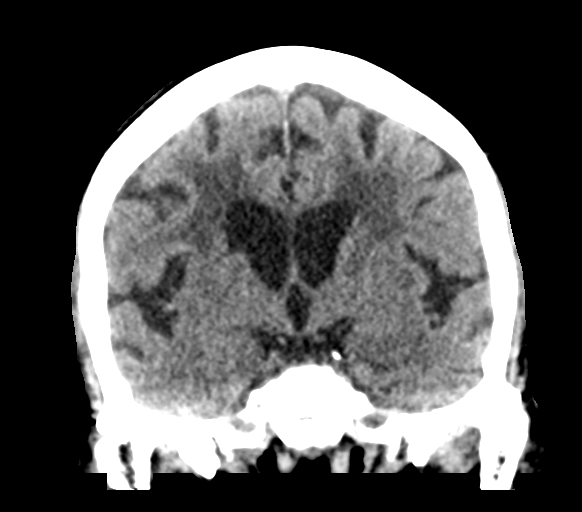
[im 36/65  brain]
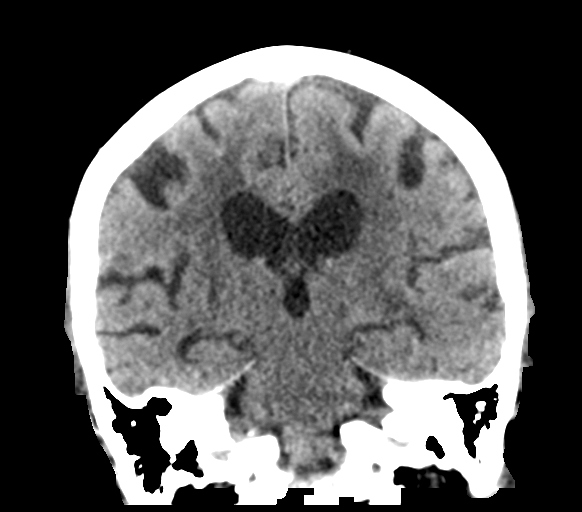

[Series 6: head without sag · sagittal · non-contrast · 0.33mm/px · 3 of 52 slices shown]
[im 18/52  brain]
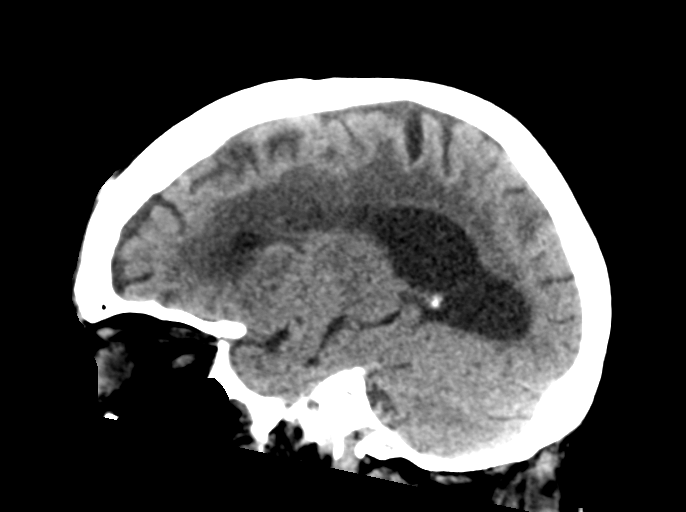
[im 26/52  brain]
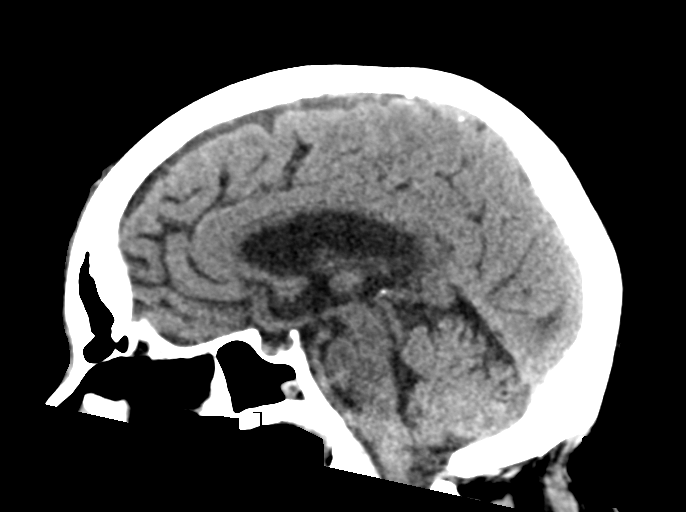
[im 35/52  brain]
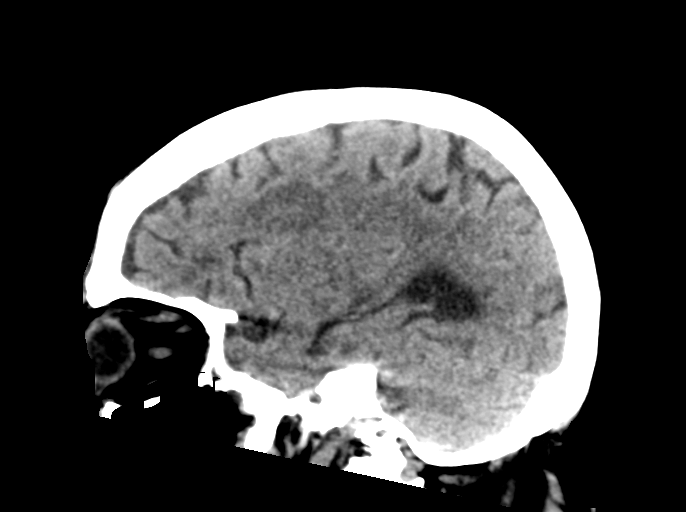

[16 of 47 positions shown; findings below may reference images not displayed]

FINDINGS: Brain: No mass lesion, intraparenchymal hemorrhage or extra-axial
collection. No evidence of acute cortical infarct. There is
periventricular hypoattenuation compatible with chronic
microvascular disease. Old bilateral gangliothalamic lacunar
infarcts.

Vascular: No hyperdense vessel or unexpected calcification.

Skull: Normal visualized skull base, calvarium and extracranial soft
tissues.

Sinuses/Orbits: No sinus fluid levels or advanced mucosal
thickening. No mastoid effusion. Normal orbits.
IMPRESSION: Chronic ischemic microangiopathy and old bilateral gangliothalamic
lacunar infarcts without acute abnormality.

## 2019-04-13 IMAGING — MR MR HEAD W/O CM
9 of 10 series · 34 of 48 positions shown · non-contrast
Comparison: CT 05/23/2017.  MRI 01/12/2017.

CLINICAL DATA: History of vascular dementia. Weight loss and
failure to thrive, worsening over the last 2 weeks.

EXAM:
MRI HEAD WITHOUT CONTRAST
TECHNIQUE: Multiplanar, multiecho pulse sequences of the brain and surrounding
structures were obtained without intravenous contrast.

[Series 3: DWI · axial · 3.0mm · 0.94mm/px · z∈[-57,+84]mm · 8 of 96 slices shown (1 of 2)]
[im 1/96]
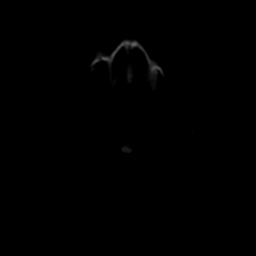
[im 14/96]
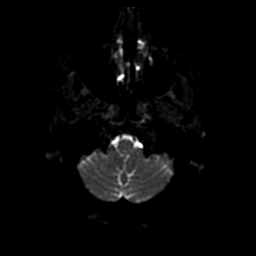
[im 28/96]
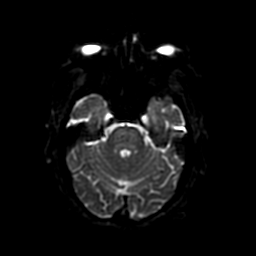
[im 41/96]
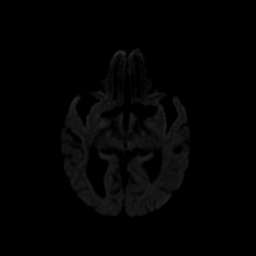
[im 55/96]
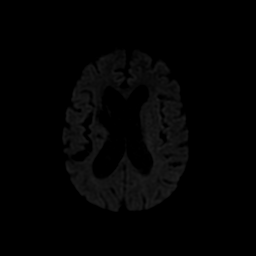
[im 68/96]
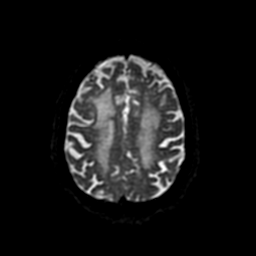
[im 82/96]
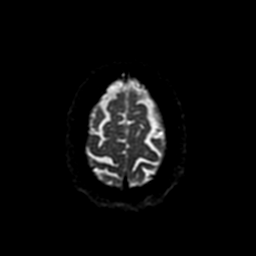
[im 96/96]
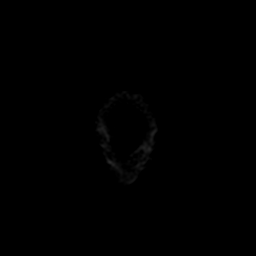

[Series 5: FLAIR · axial · 5.0mm · 0.47mm/px · z∈[-60,+90]mm · 3 of 26 slices shown (1 of 2)]
[im 1/26]
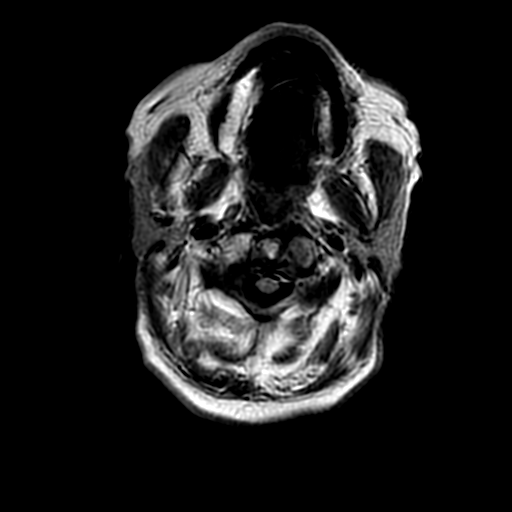
[im 13/26]
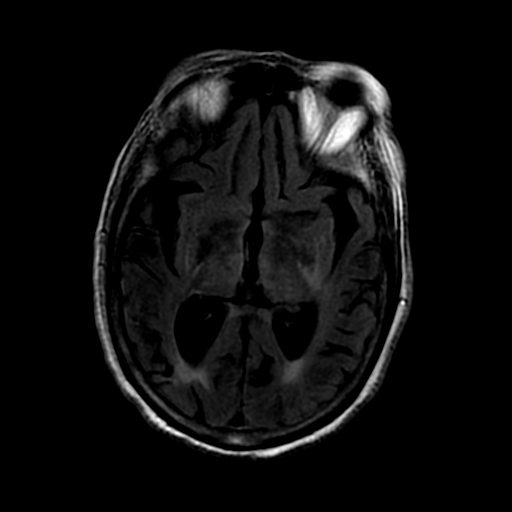
[im 26/26]
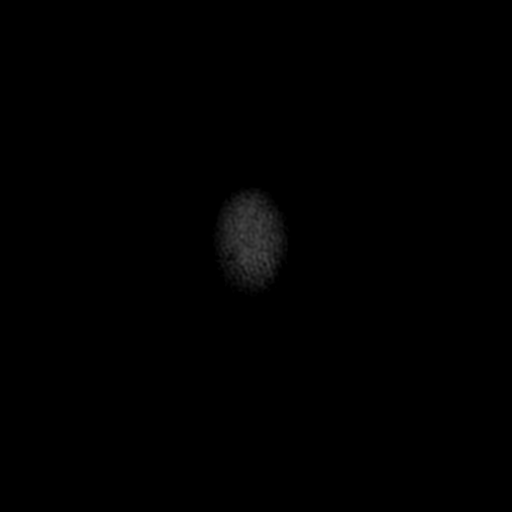

[Series 6: T2 · axial · 5.0mm · 0.47mm/px · z∈[-60,+90]mm · 3 of 26 slices shown (1 of 2)]
[im 1/26]
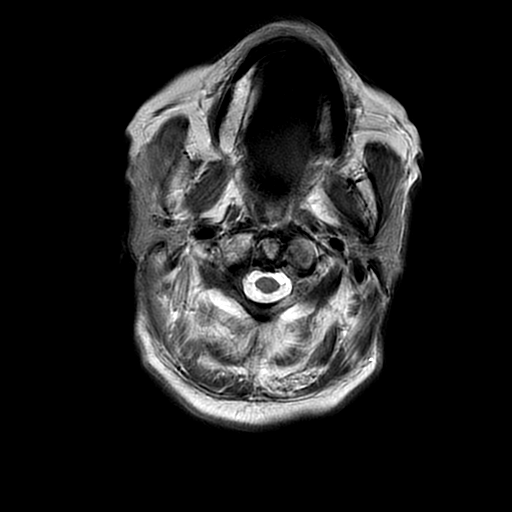
[im 13/26]
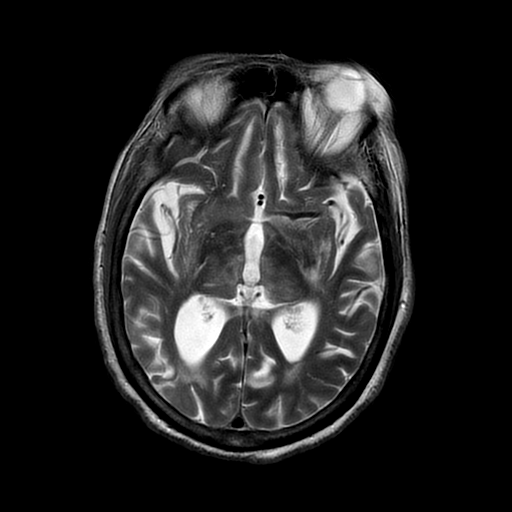
[im 26/26]
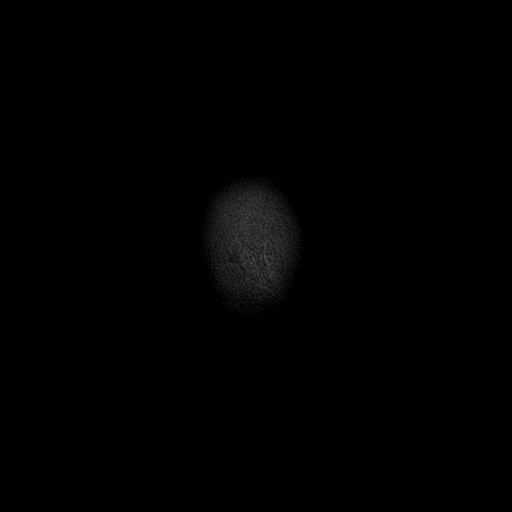

[Series 7: DWI · coronal · 4.0mm · 0.94mm/px · 6 of 64 slices shown (2 of 2)]
[im 1/64]
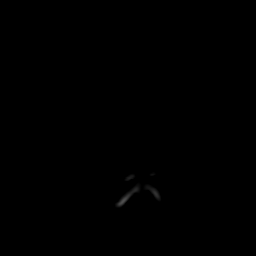
[im 13/64]
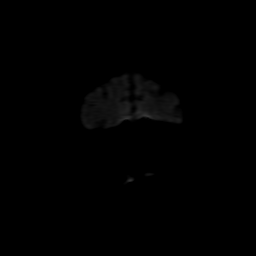
[im 26/64]
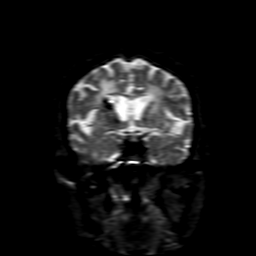
[im 38/64]
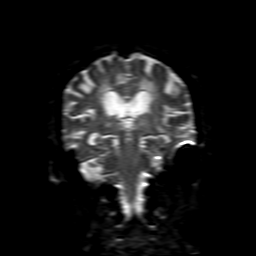
[im 51/64]
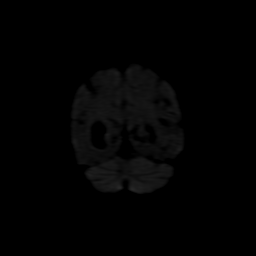
[im 64/64]
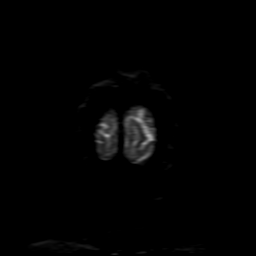

[Series 8: FLAIR · sagittal · 5.0mm · 0.47mm/px · 2 of 23 slices shown (2 of 2)]
[im 1/23]
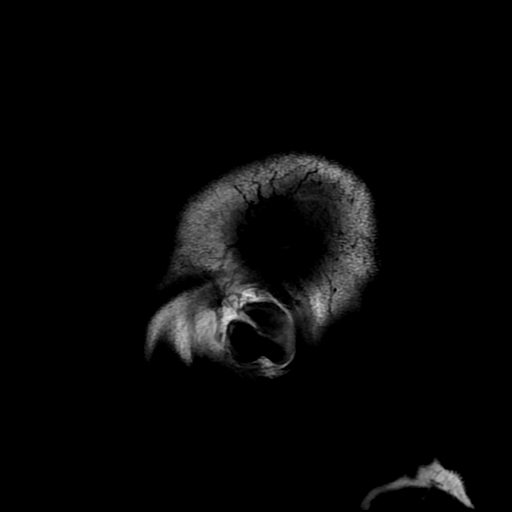
[im 23/23]
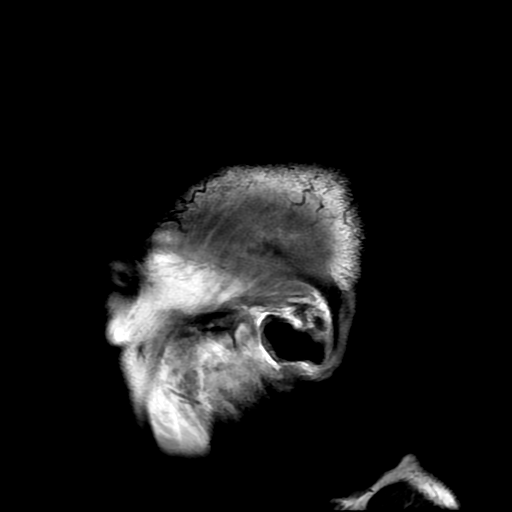

[Series 9: (person_name) · axial · 3.0mm · 0.47mm/px · 1 of 100 slices shown]
[im 1/100]
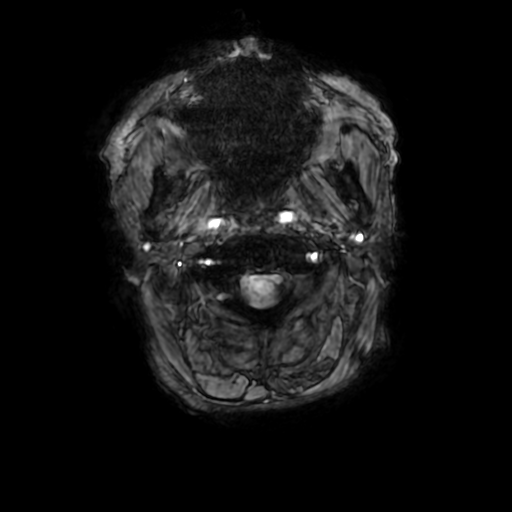

[Series 11: T2 · coronal · 5.0mm · 0.47mm/px · 3 of 28 slices shown (2 of 2)]
[im 1/28]
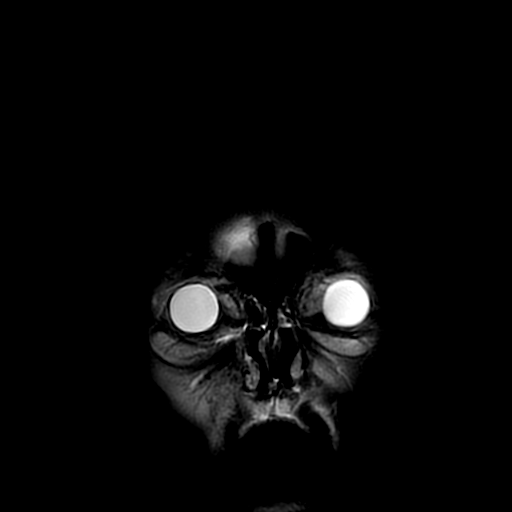
[im 14/28]
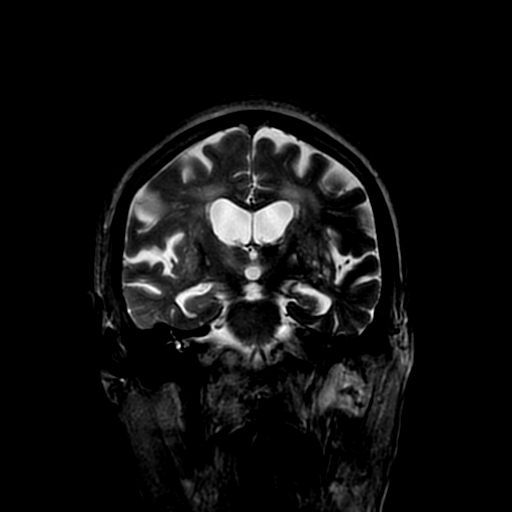
[im 28/28]
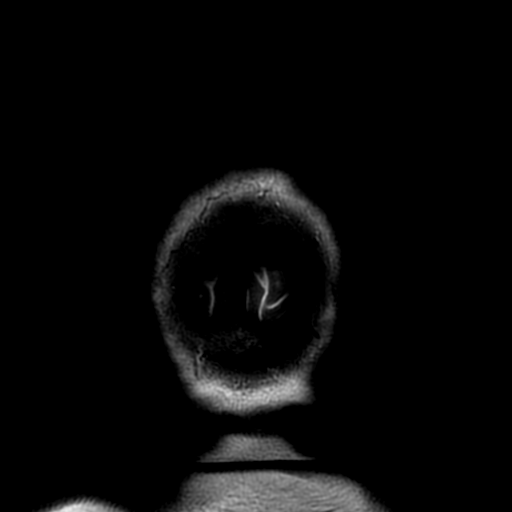

[Series 350: ADC · axial · 3.0mm · 0.94mm/px · z∈[-57,+84]mm · 5 of 48 slices shown (1 of 2)]
[im 1/48]
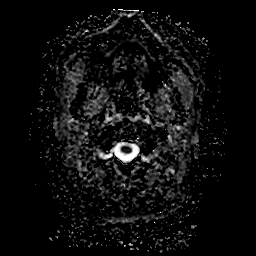
[im 12/48]
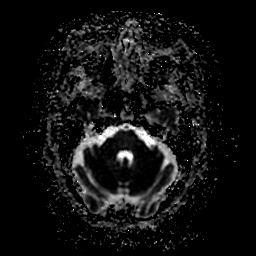
[im 24/48]
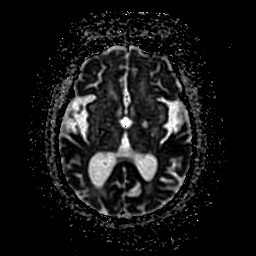
[im 36/48]
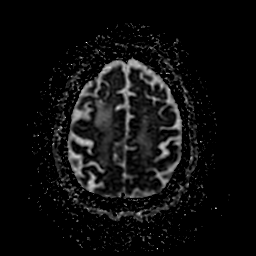
[im 48/48]
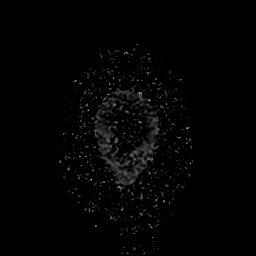

[Series 750: ADC · coronal · 4.0mm · 0.94mm/px · 3 of 32 slices shown (2 of 2)]
[im 1/32]
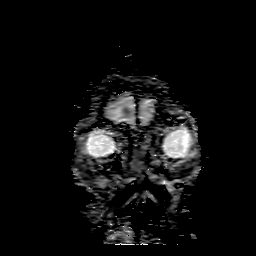
[im 16/32]
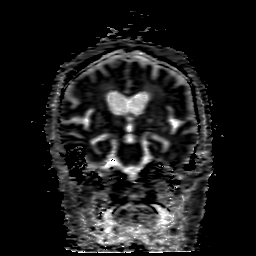
[im 32/32]
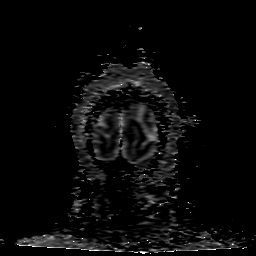

[34 of 48 positions shown; findings below may reference images not displayed]

FINDINGS: Brain: Diffusion imaging does not show any acute or subacute
infarction. Chronic small-vessel ischemic changes are seen
throughout pons. Few old small vessel cerebellar infarctions.
Cerebral hemispheres show extensive chronic small-vessel ischemic
changes throughout the thalami, basal ganglia and hemispheric white
matter. Many of the old infarctions are associated with hemosiderin
deposition. More extensive old hemorrhagic infarction in the right
basal ganglia an the left frontal subcortical white matter. No
evidence of mass lesion. No sign of acute hemorrhage or edema. No
hydrocephalus or extra-axial collection.

Vascular: Major vessels at the base of the brain show flow.

Skull and upper cervical spine: Negative

Sinuses/Orbits: Clear/normal

Other: None significant
IMPRESSION: No acute or reversible finding by MRI. Extensive chronic ischemic
changes throughout the brain, many associated with hemosiderin
deposition, fully described above.

## 2019-04-13 IMAGING — CR DG CHEST 2V
2 series · 2 of 2 positions shown · non-contrast
Comparison: Chest radiograph 05/21/2017

CLINICAL DATA: Shortness of breath

EXAM:
CHEST  2 VIEW

[chest lat]
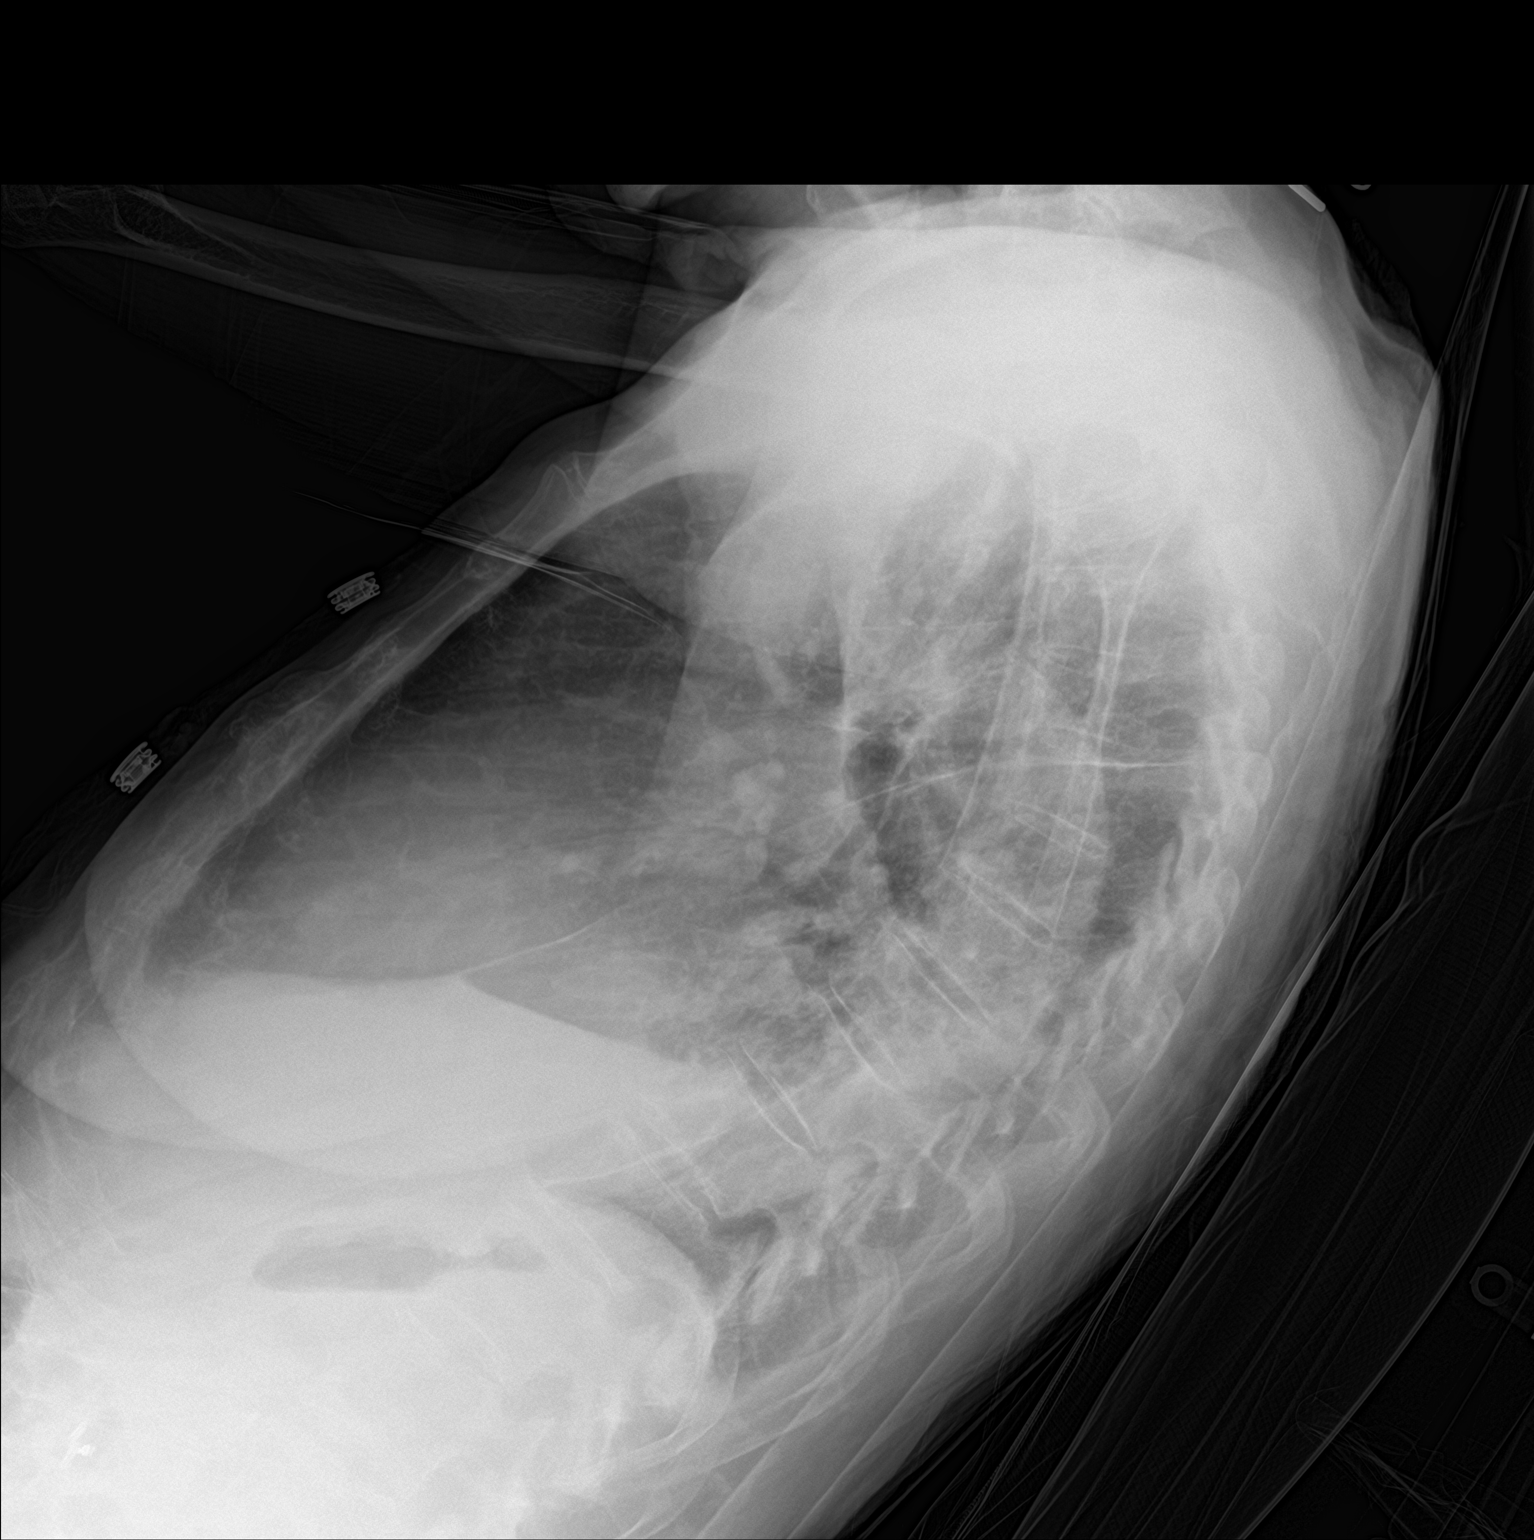

[chest ap]
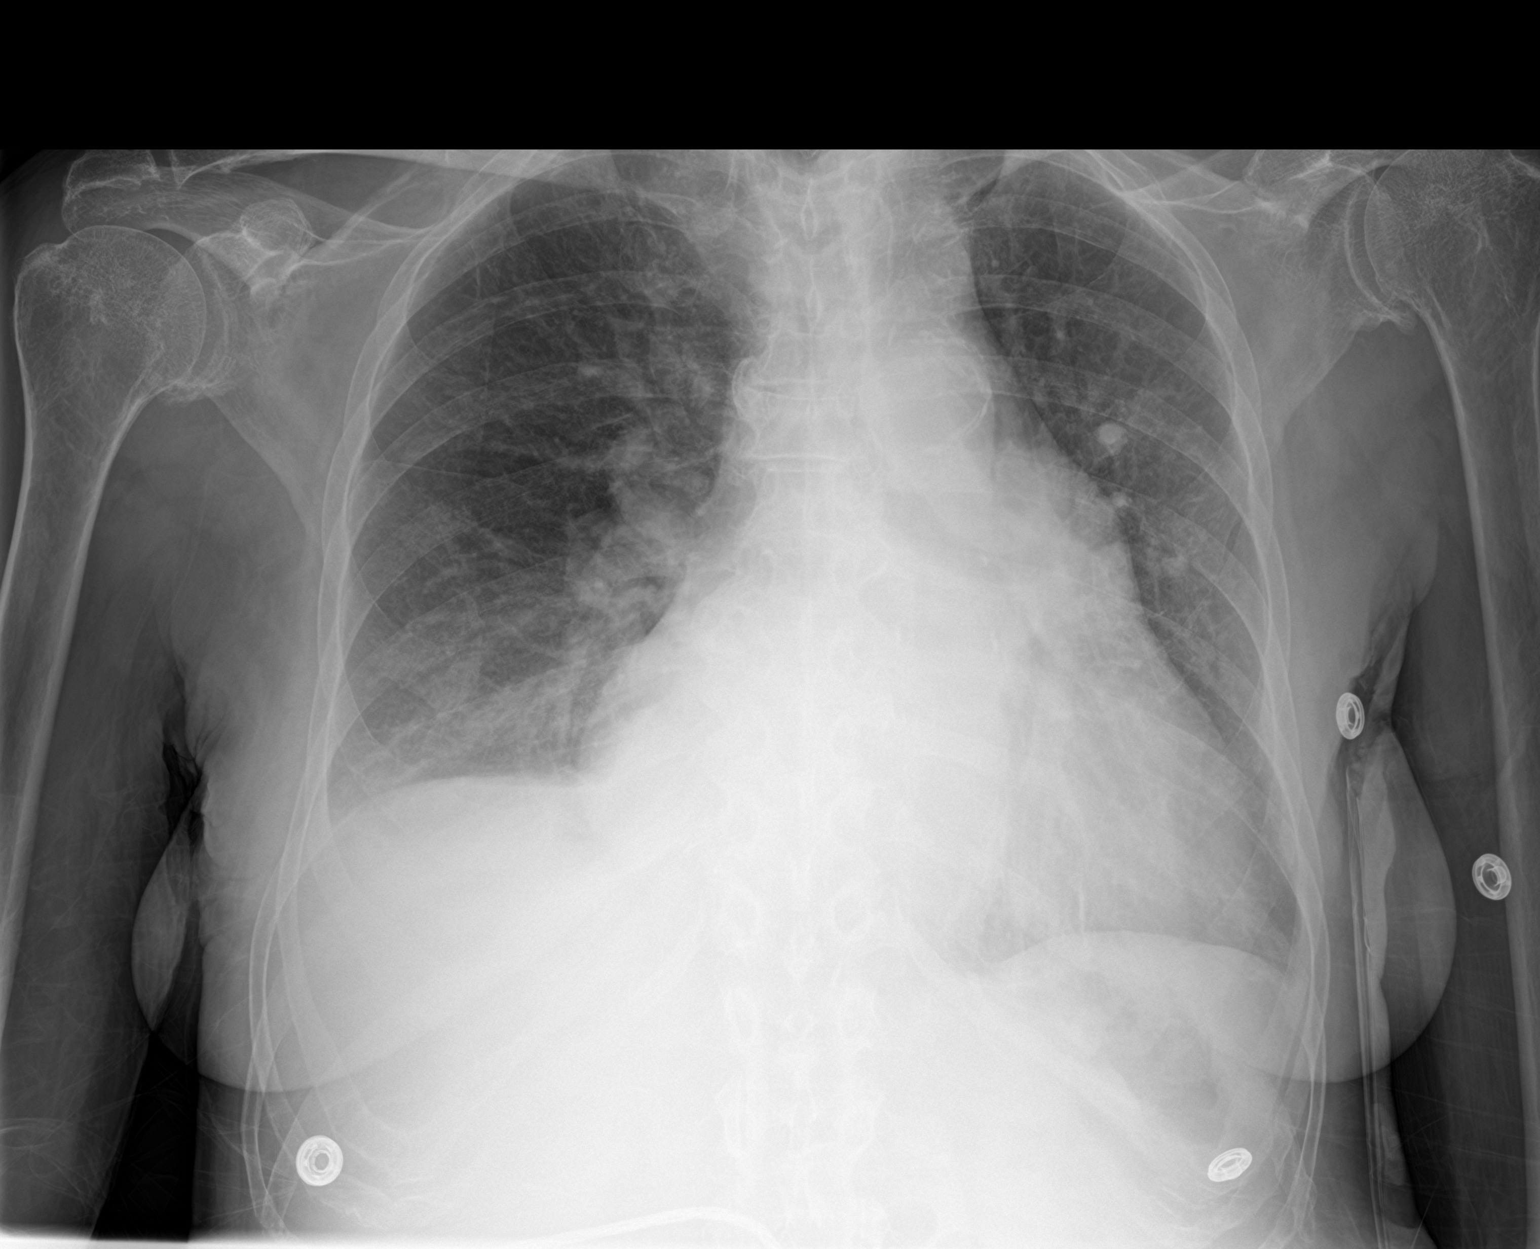

[2 of 2 positions shown; findings below may reference images not displayed]

FINDINGS: Massive cardiomegaly is unchanged with prominence of the pulmonary
artery. There is calcific aortic atherosclerosis. Left mid lung
pulmonary nodular opacities are unchanged. No focal airspace
consolidation or pulmonary edema. Small right pleural effusion.
IMPRESSION: 1. Cardiomegaly and enlarged pulmonary artery with aortic
atherosclerosis ([id number]).
2. Small right pleural effusion, unchanged.

## 2019-05-20 IMAGING — DX DG CHEST 2V
2 series · 2 of 2 positions shown · non-contrast
Comparison: 05/23/2017

CLINICAL DATA: 86-year-old female with shortness of breath.

EXAM:
CHEST  2 VIEW

[x chest ap]
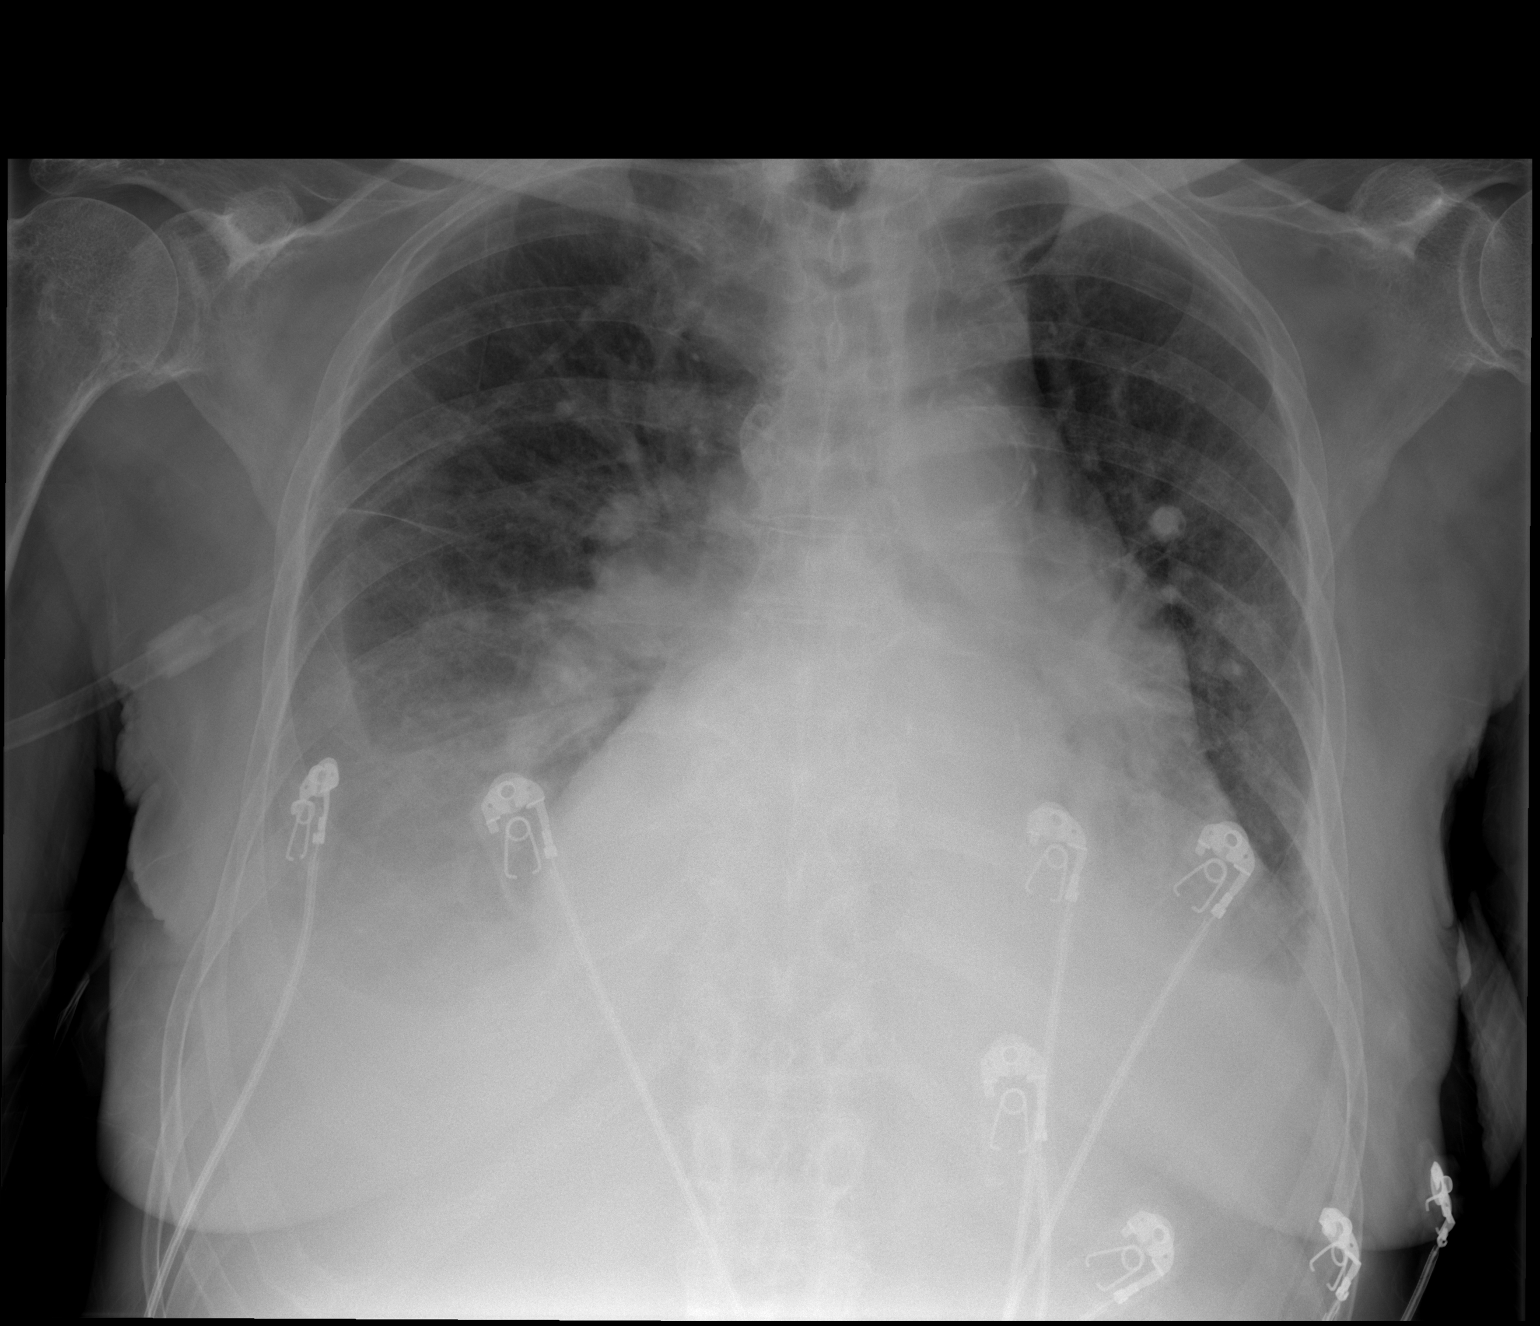

[w chest lat]
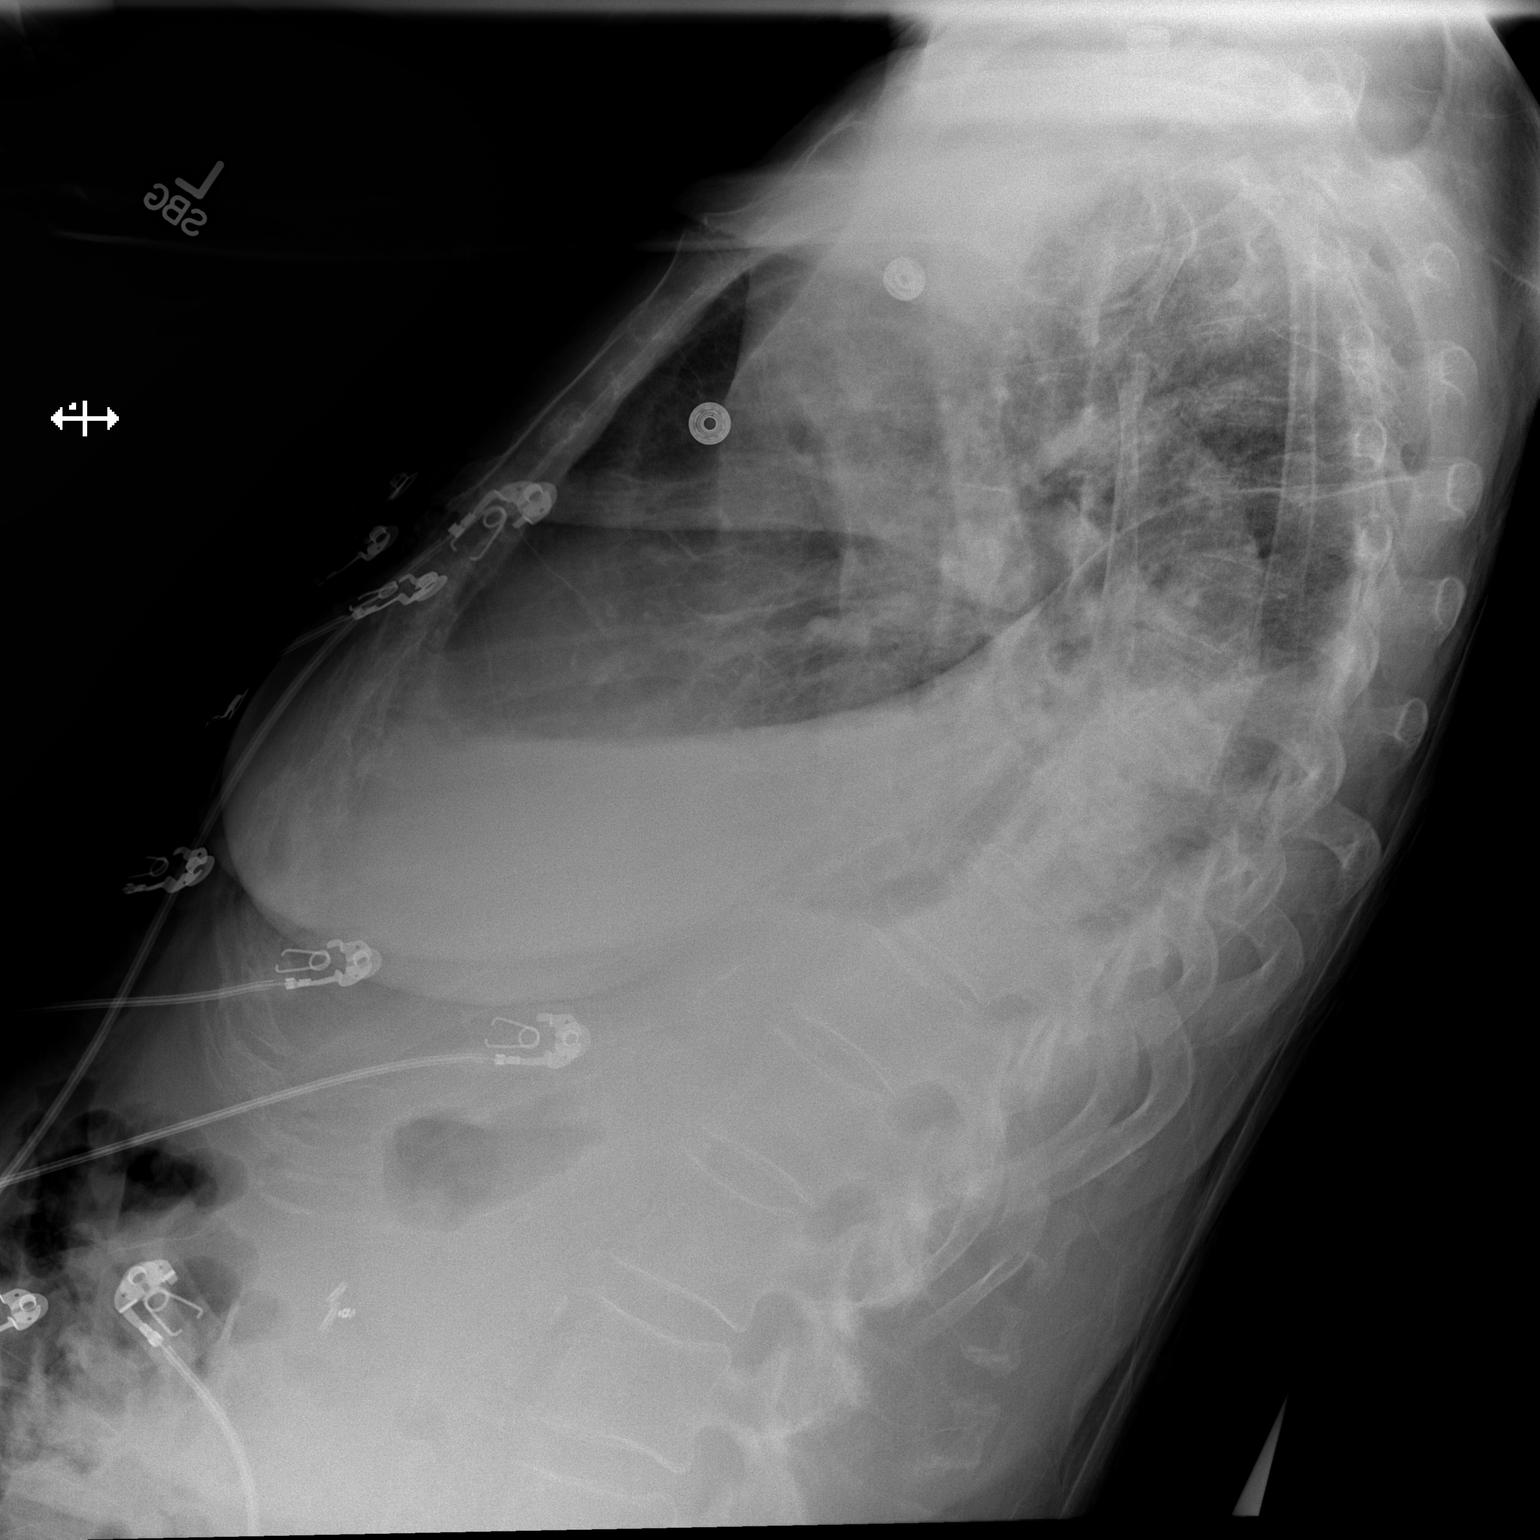

[2 of 2 positions shown; findings below may reference images not displayed]

FINDINGS: Cardiomediastinal silhouette remains enlarged. There is pulmonary
vascular congestion and a likely component of mild interstitial
edema.

Enlarging bibasilar pleural effusions, right greater than left with
associated atelectasis. Superimposed consolidation not excluded. No
pneumothorax.

No acute osseous abnormalities.
IMPRESSION: Worsening signs of CHF with cardiomegaly, edema and enlarging
bibasilar pleural effusions. Superimposed consolidation difficult to
entirely exclude.

## 2019-05-20 IMAGING — CT CT HEAD W/O CM
4 series · 16 of 47 positions shown, 18 images · non-contrast
Comparison: Brain MRI, brain MRI and head CT, 05/23/2017.

CLINICAL DATA: Headache and dizziness since this morning.

EXAM:
CT HEAD WITHOUT CONTRAST
TECHNIQUE: Contiguous axial images were obtained from the base of the skull
through the vertex without intravenous contrast.

[Series 3: head wo · axial · 0.43mm/px · z∈[-174,-64]mm · 7 of 30 slices shown, 9 images]
[im 4/30  brain]
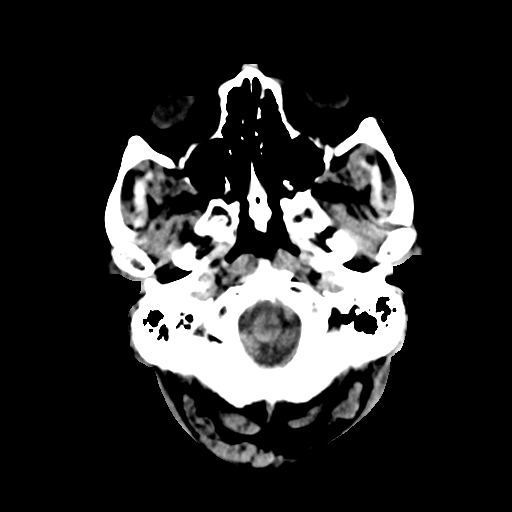
[im 4/30  bone]
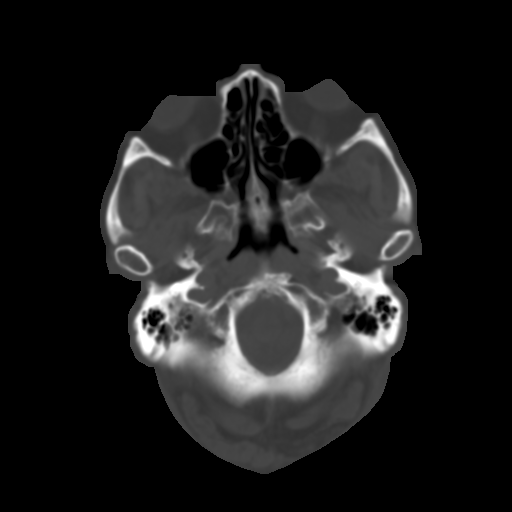
[im 8/30  brain]
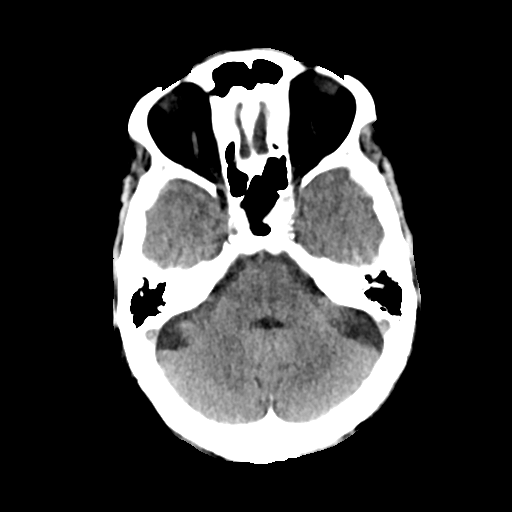
[im 11/30  brain]
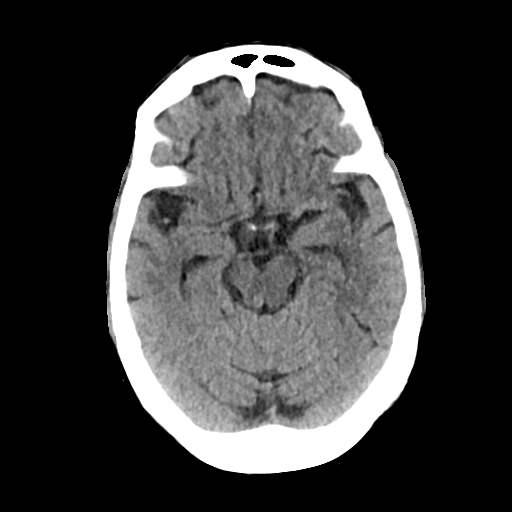
[im 15/30  brain]
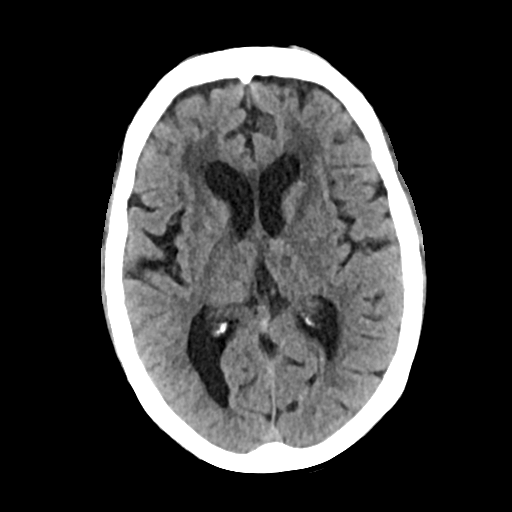
[im 19/30  brain]
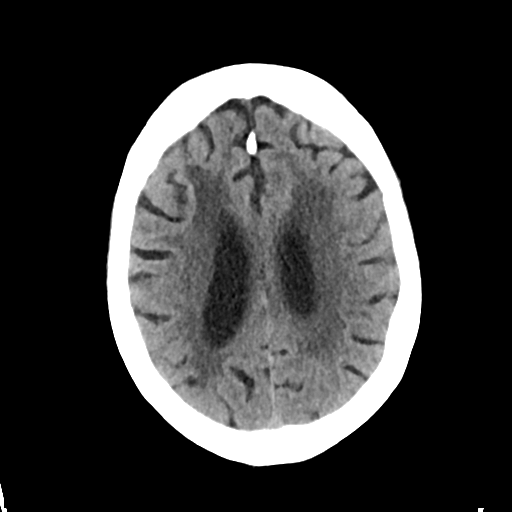
[im 19/30  bone]
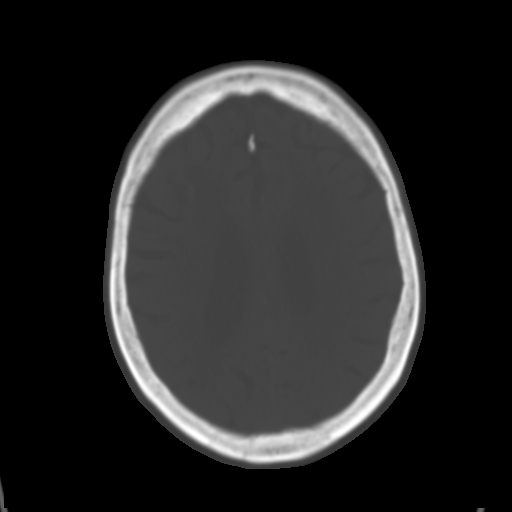
[im 22/30  brain]
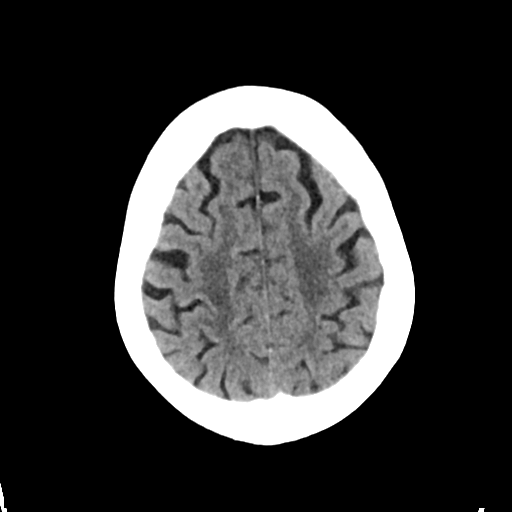
[im 26/30  brain]
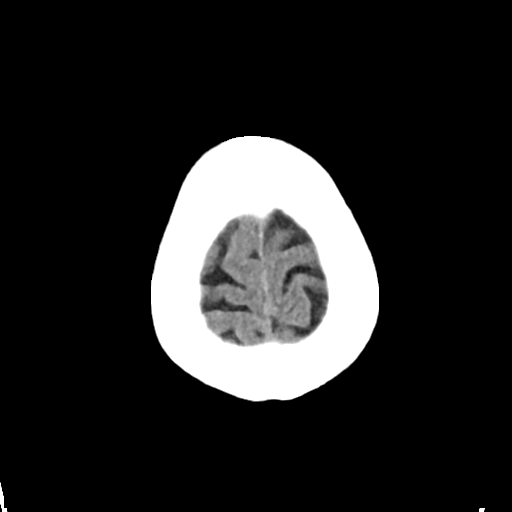

[Series 4: head bone · axial · 0.43mm/px · z∈[-175,-145]mm · 3 of 75 slices shown]
[im 8/75  bone]
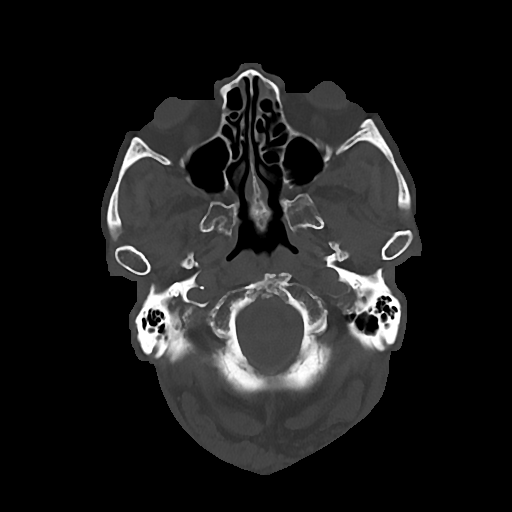
[im 15/75  bone]
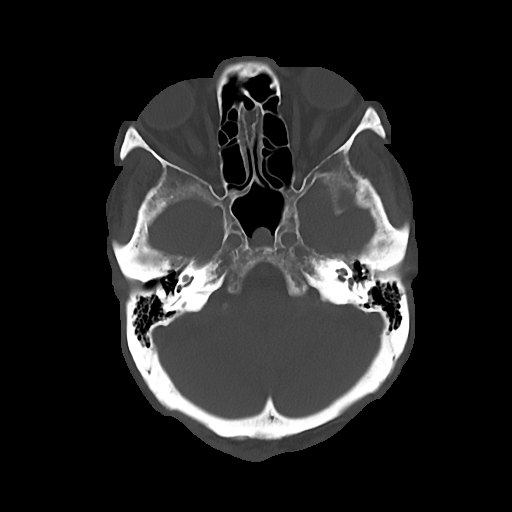
[im 23/75  bone]
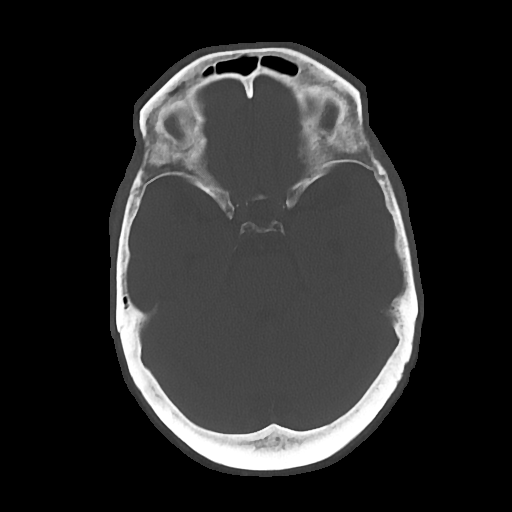

[Series 5: cor soft · coronal · 0.30mm/px · 3 of 63 slices shown]
[im 21/63  brain]
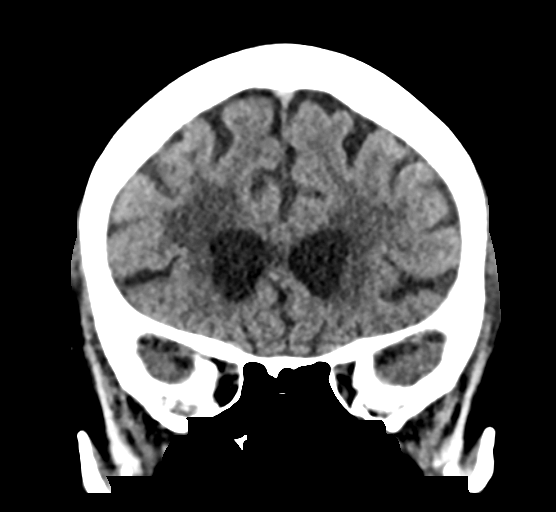
[im 28/63  brain]
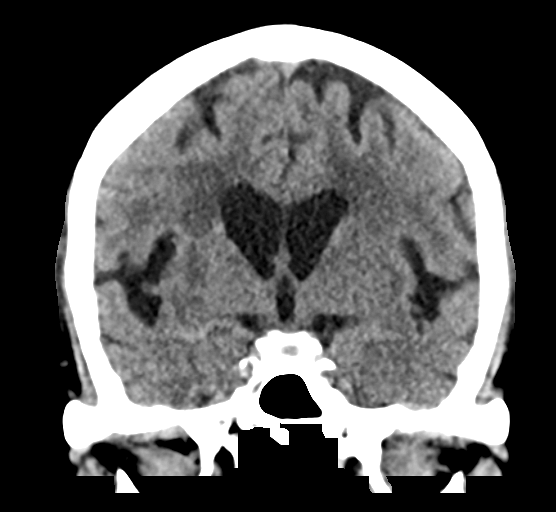
[im 35/63  brain]
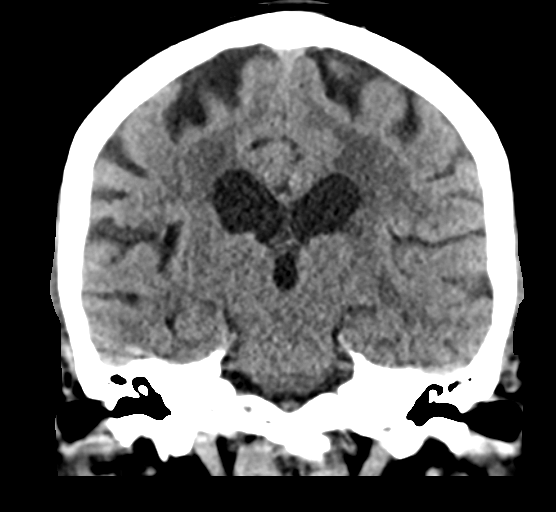

[Series 6: sag soft · sagittal · 0.32mm/px · 3 of 48 slices shown]
[im 16/48  brain]
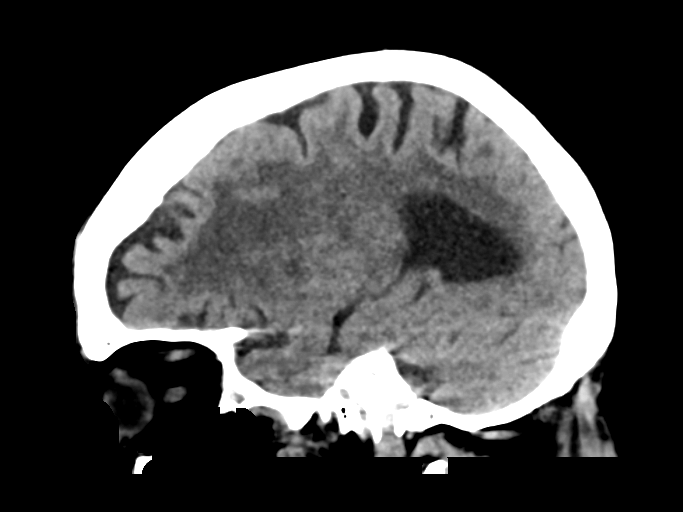
[im 24/48  brain]
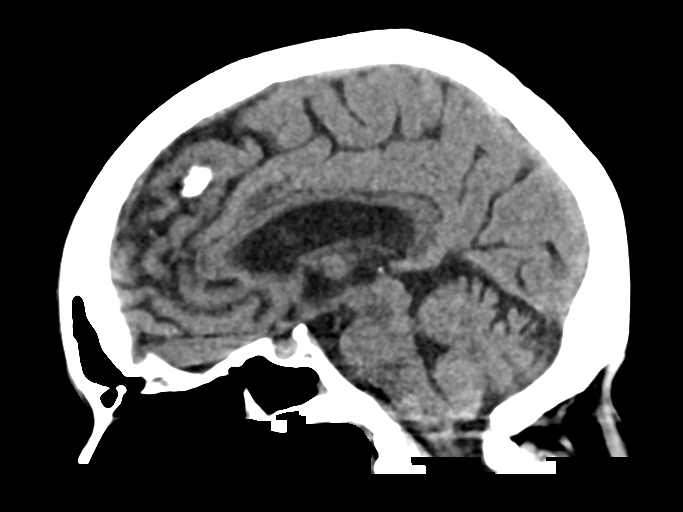
[im 32/48  brain]
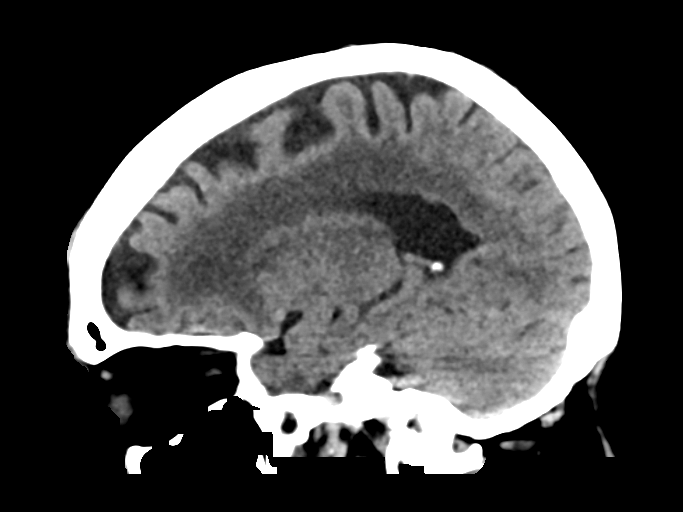

[16 of 47 positions shown; findings below may reference images not displayed]

FINDINGS: Brain: No evidence of acute infarction, hemorrhage, hydrocephalus,
extra-axial collection or mass lesion/mass effect.

There is ventricular sulcal enlargement reflecting age appropriate
volume loss. Patchy white matter hypoattenuation is noted consistent
with moderate chronic microvascular ischemic change.

Vascular: No hyperdense vessel or unexpected calcification.

Skull: Normal. Negative for fracture or focal lesion.

Sinuses/Orbits: Globes and orbits are unremarkable. Mild dependent
mucosal thickening in the left sphenoid sinus. Visualized sinuses
otherwise clear. Clear mastoid air cells.

Other: None.
IMPRESSION: 1. No acute intracranial abnormalities.
2. Age related volume loss. Moderate chronic microvascular ischemic
change.

## 2019-08-30 IMAGING — DX DG CHEST 1V PORT
1 series · 1 of 1 positions shown · non-contrast
Comparison: Most recent radiograph 06/29/2017. Most recent CT
01/11/2017

CLINICAL DATA: Shortness of breath.  Atrial fibrillation.

EXAM:
PORTABLE CHEST 1 VIEW

[chest ap]
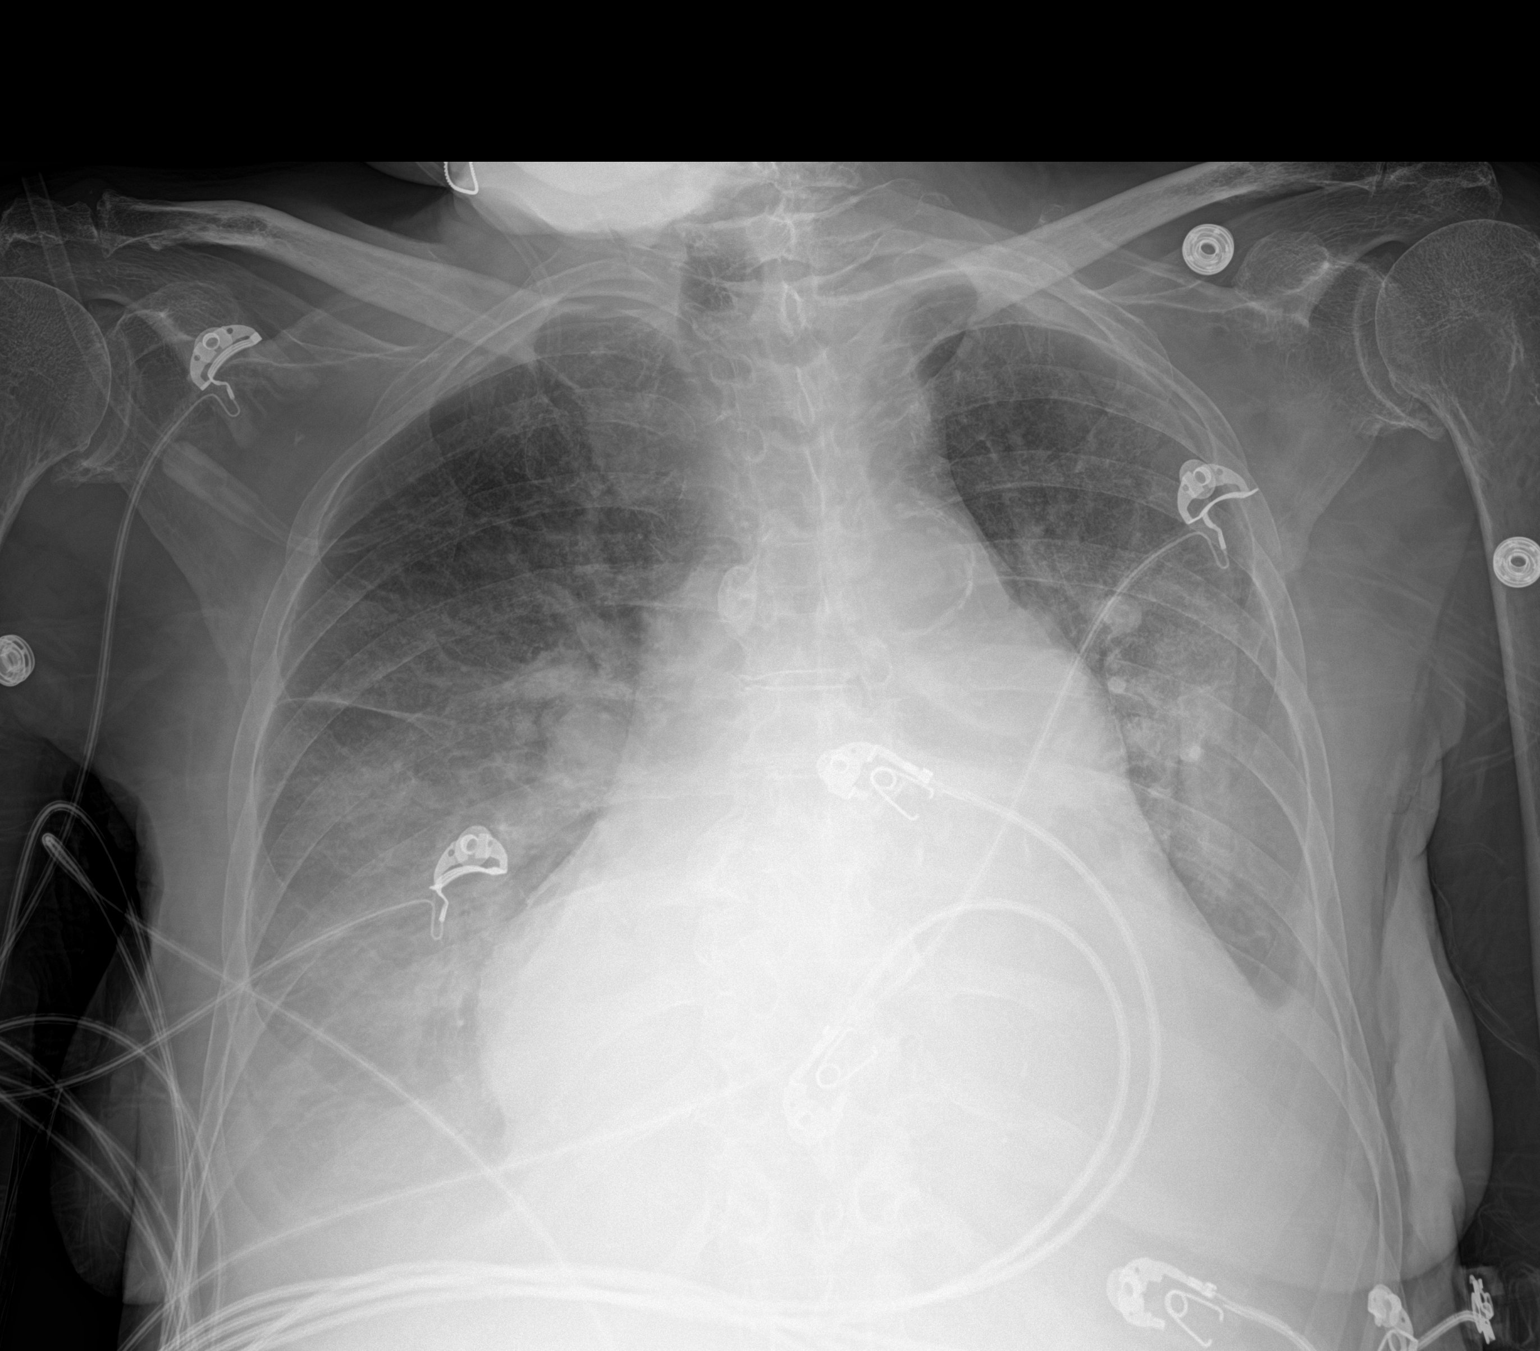

[1 of 1 positions shown; findings below may reference images not displayed]

FINDINGS: Marked cardiomegaly, unchanged. Moderate bilateral pleural
effusions, similar to prior exam. There is central pulmonary edema,
with progression. No pneumothorax or new focal airspace disease.
Multiple skin folds project over the left chest.
IMPRESSION: Progressive pulmonary edema from prior exam. Chronic cardiomegaly
and moderate bilateral pleural effusions.

## 2019-09-05 IMAGING — DX DG FOOT COMPLETE 3+V*L*
3 series · 3 of 3 positions shown · non-contrast
Comparison: No recent prior.

CLINICAL DATA: Left foot pain for 1 month. No injury. History of
gout and diabetes.

EXAM:
LEFT FOOT - COMPLETE 3+ VIEW

[foot obl]
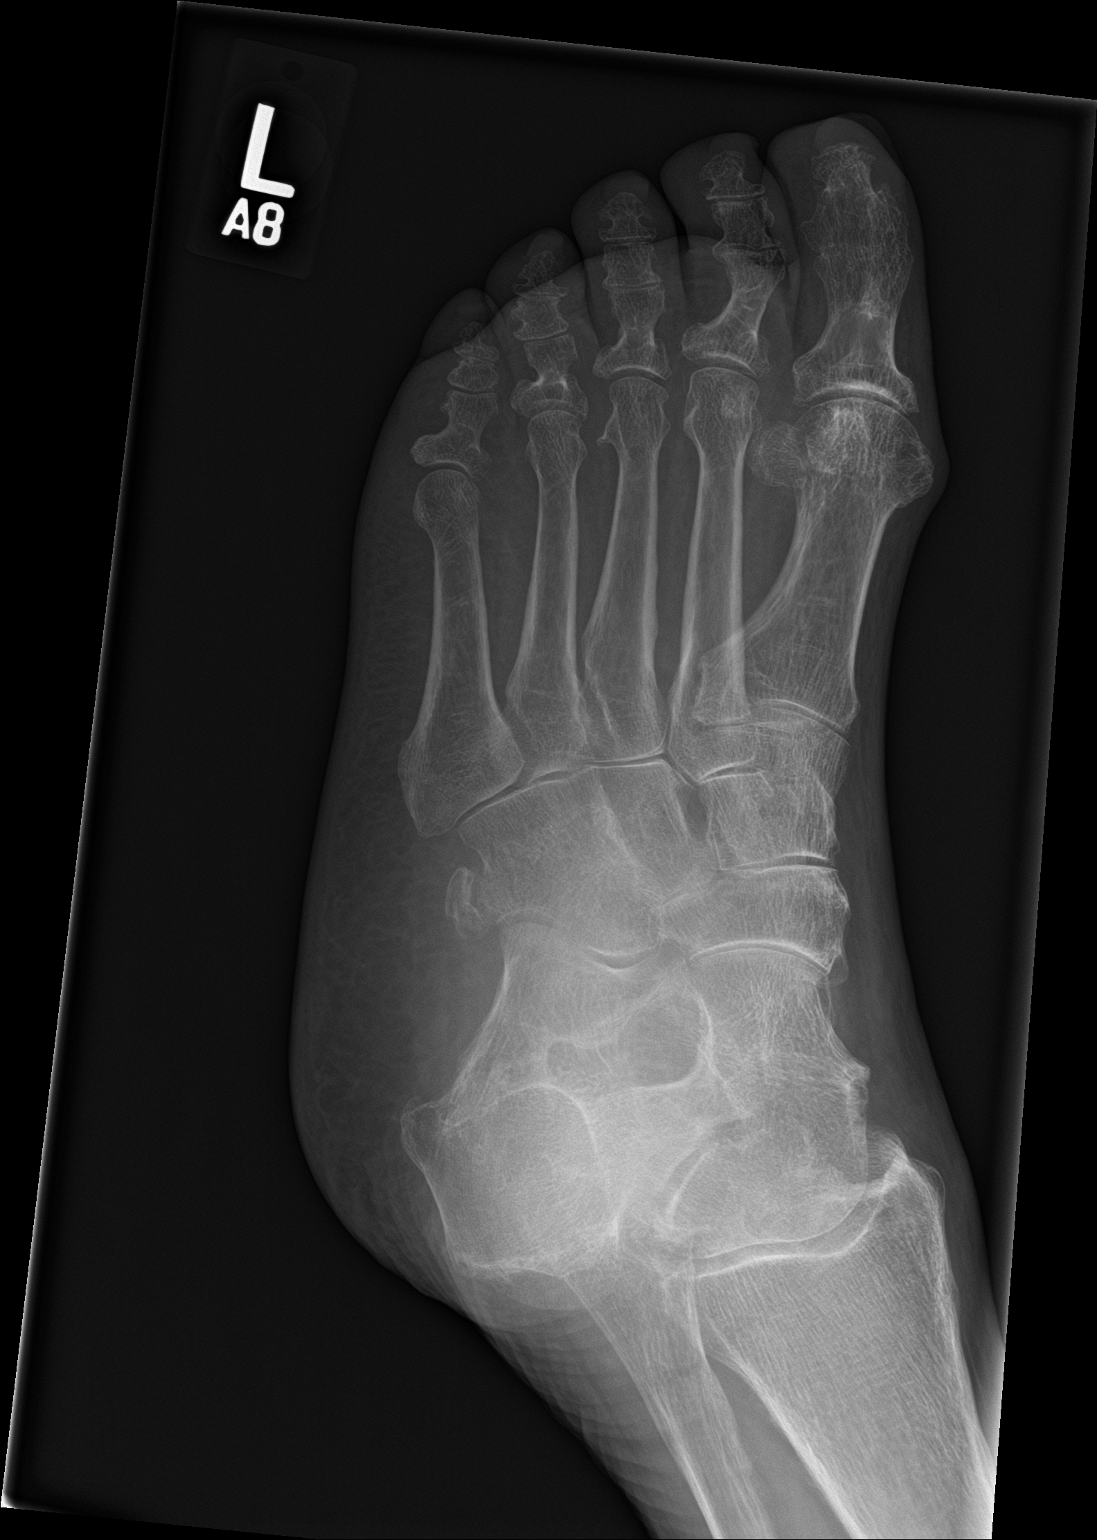

[foot lat]
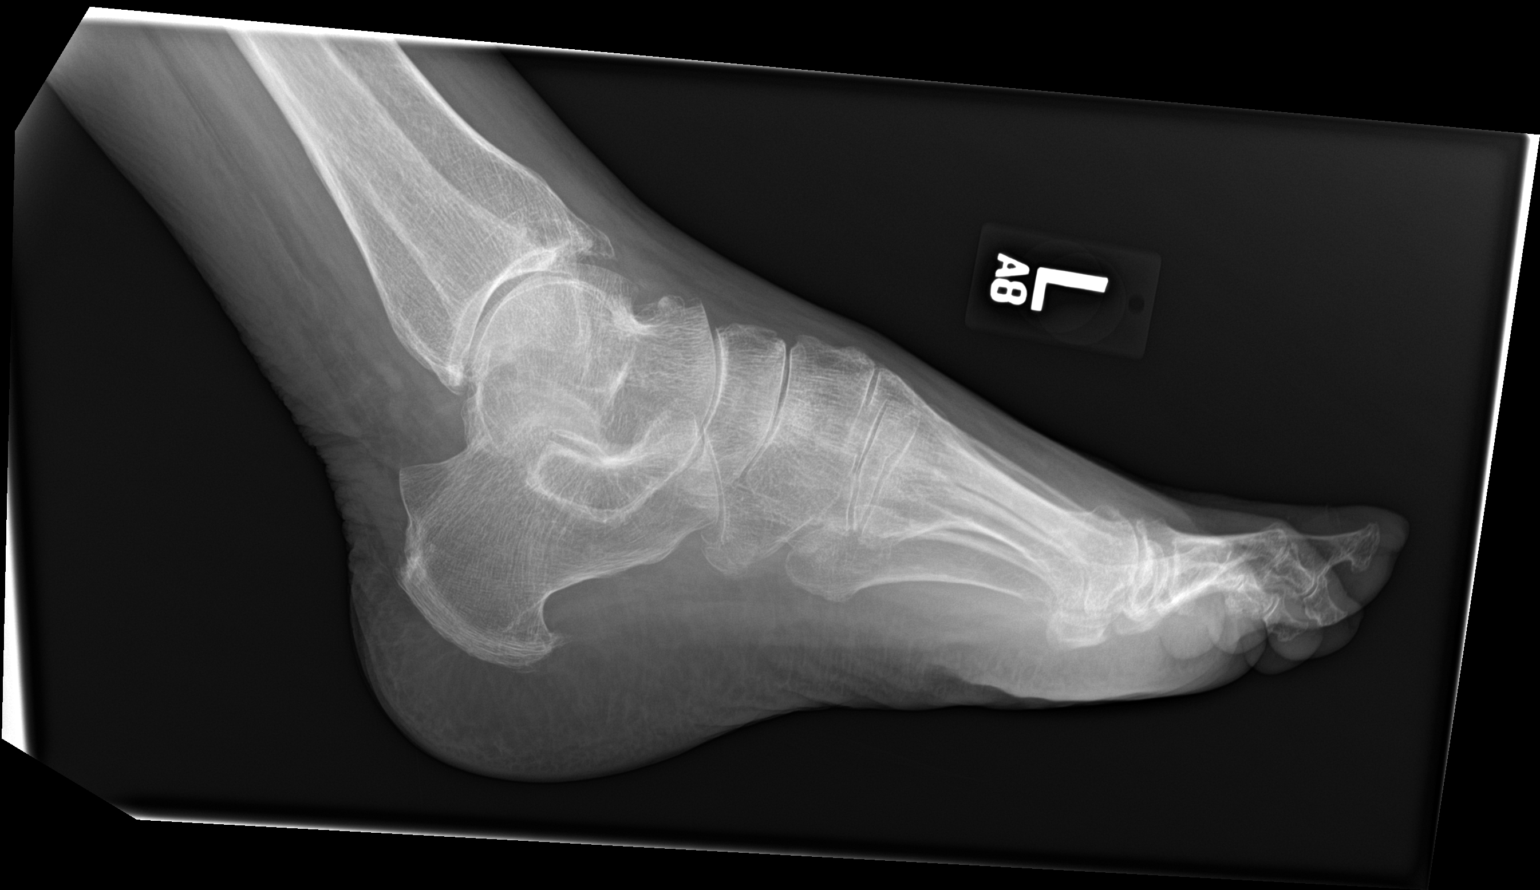

[foot ap]
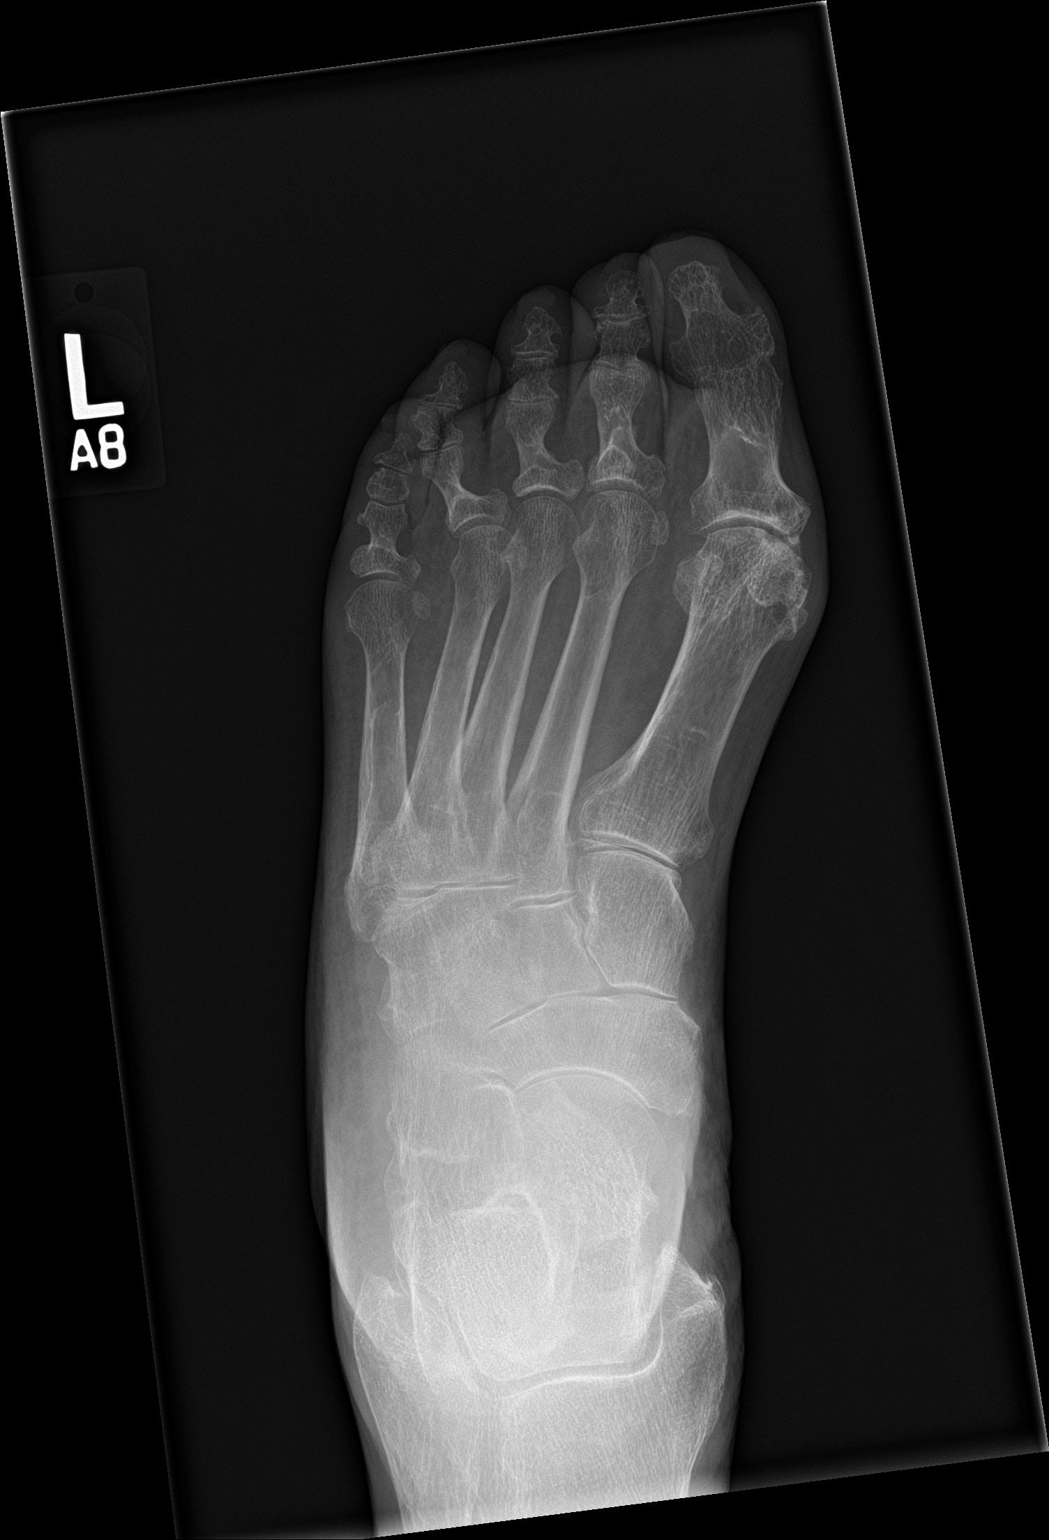

[3 of 3 positions shown; findings below may reference images not displayed]

FINDINGS: Diffuse osteopenia and degenerative change. Degenerative changes
most prominent about the first MTP joint. No acute bony abnormality.
No evidence of fracture or dislocation. No radiopaque foreign body.
IMPRESSION: Diffuse osteopenia and degenerative change. Degenerative changes
most prominent about the first MTP joint. No acute abnormality.

## 2019-12-31 IMAGING — CR DG CHEST 2V
2 series · 2 of 2 positions shown · non-contrast
Comparison: 09/11/2017

CLINICAL DATA: Shortness of breath

EXAM:
CHEST - 2 VIEW

[chest lat]
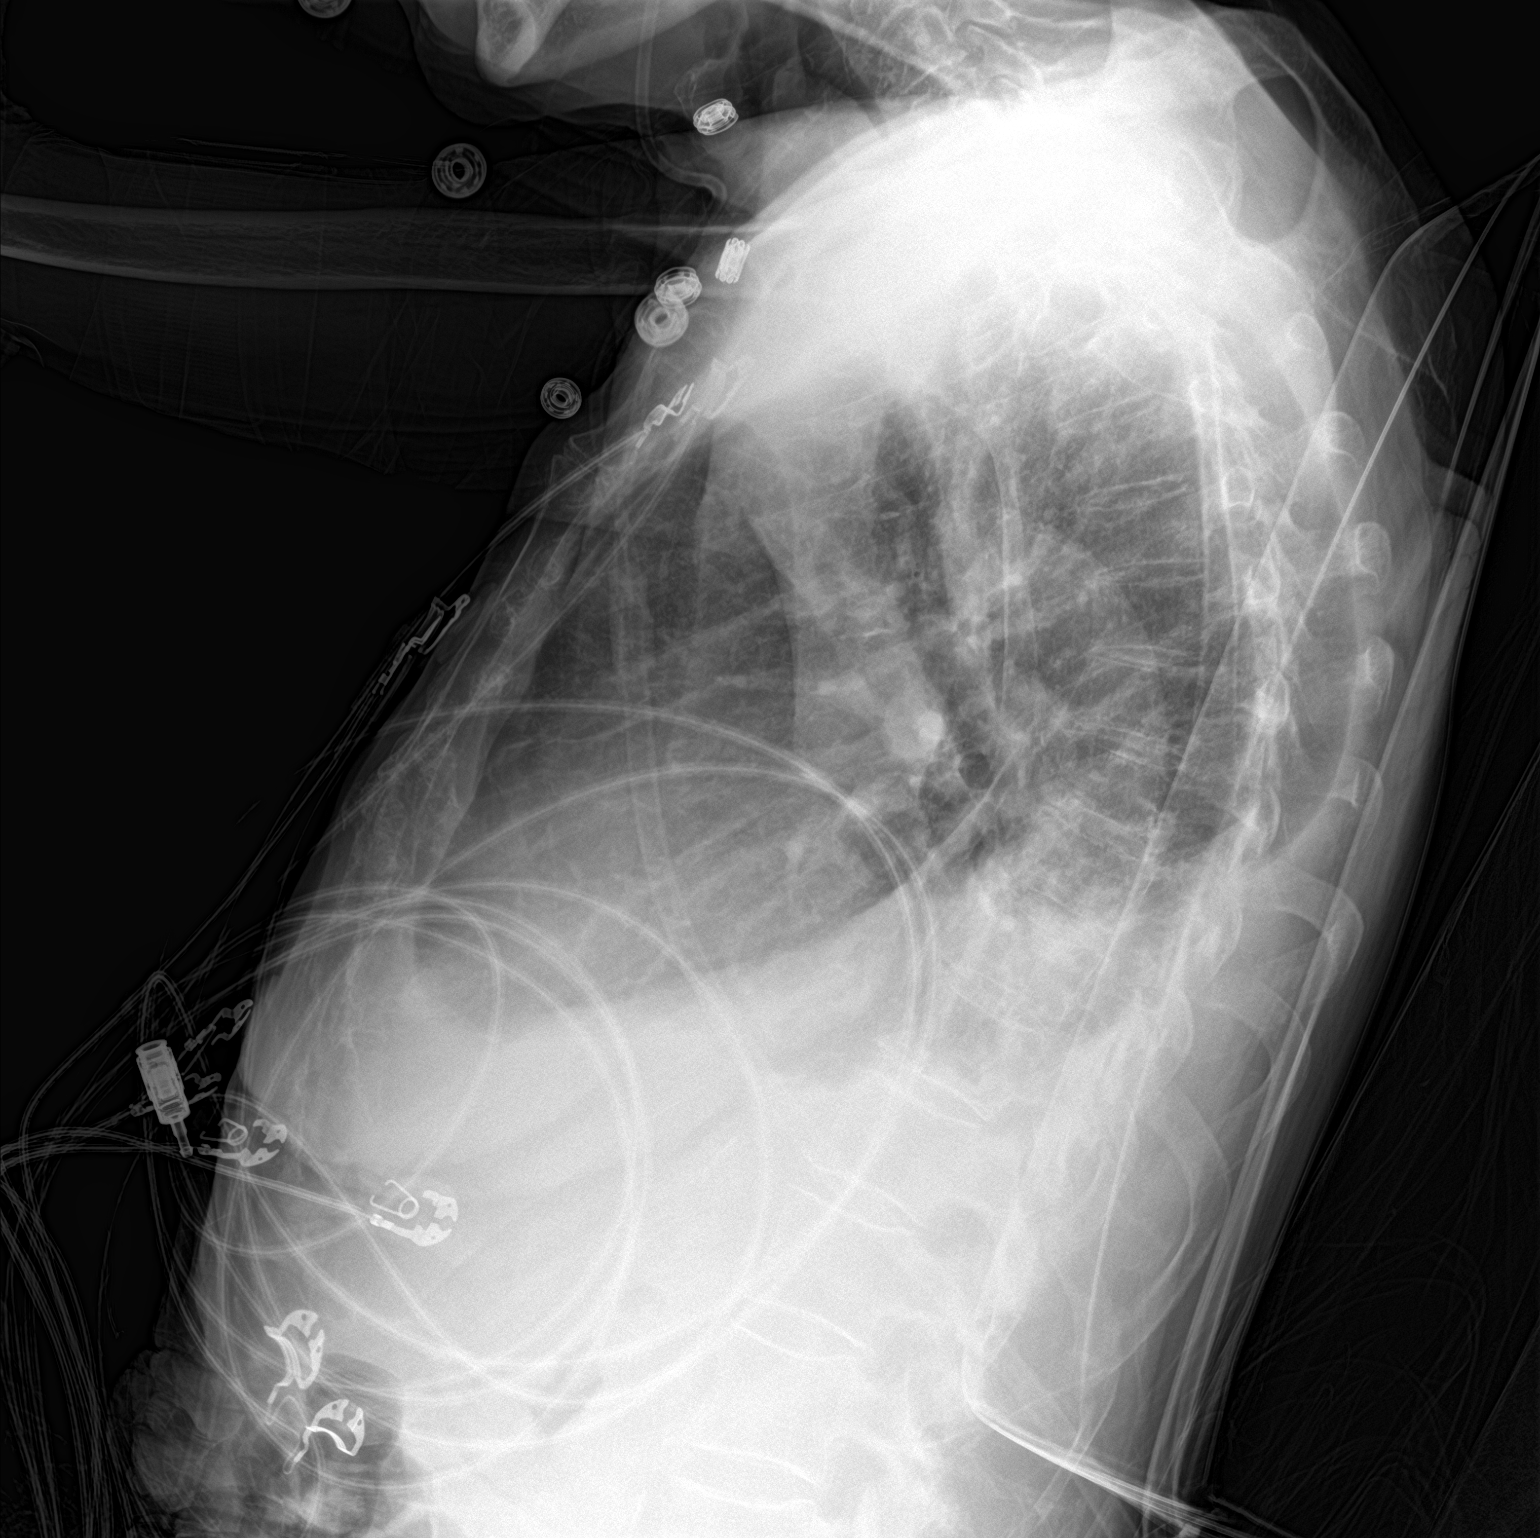

[chest ap]
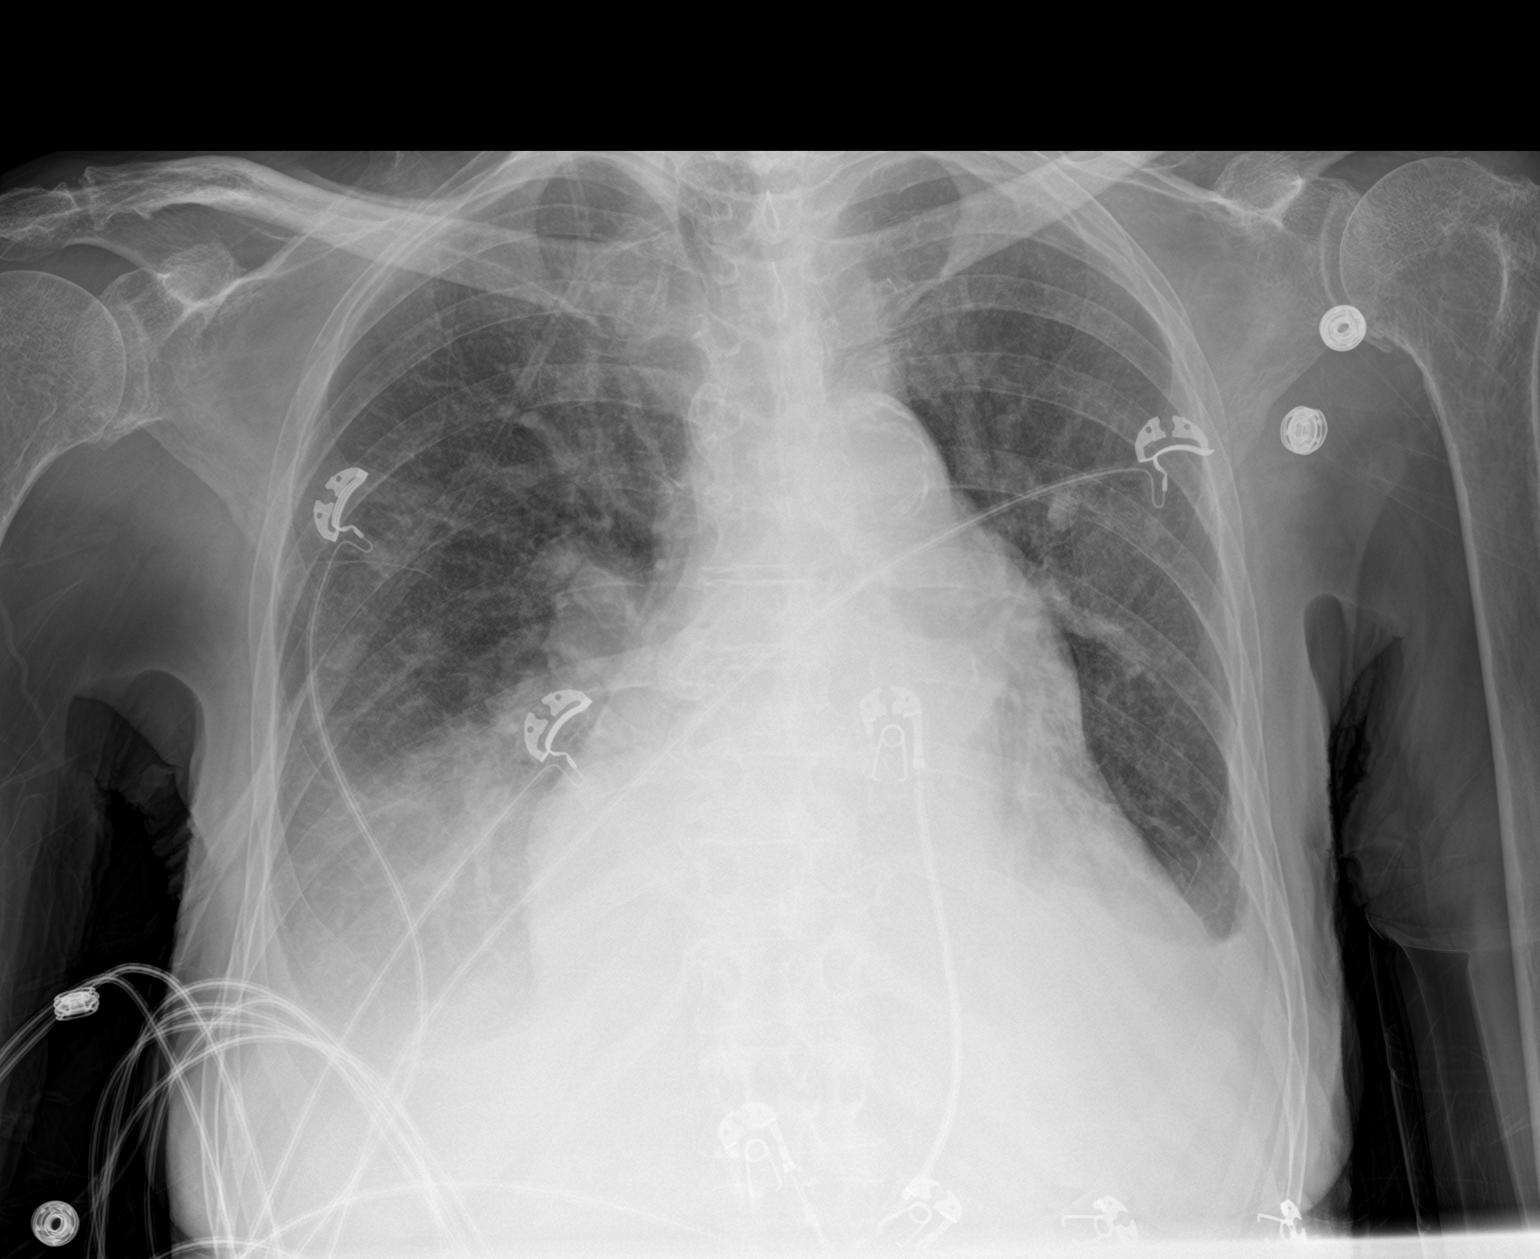

[2 of 2 positions shown; findings below may reference images not displayed]

FINDINGS: Stable cardiomegaly with vascular congestion and mild to moderate
pleural effusions. Associated bibasilar atelectasis/partial
consolidation. Upper lungs remain clear. No pneumothorax. Trachea is
midline. Aorta is atherosclerotic. Degenerative changes of the spine
with diffuse osteopenia.
IMPRESSION: Cardiomegaly with vascular congestion and pleural effusions.

Bibasilar compressive atelectasis/partial consolidation.

## 2019-12-31 IMAGING — CT CT CHEST W/O CM
2 of 4 series · 15 of 36 positions shown, 18 images · non-contrast
Comparison: CTA chest 01/11/2017

CLINICAL DATA: Shortness of breath

EXAM:
CT CHEST WITHOUT CONTRAST
TECHNIQUE: Multidetector CT imaging of the chest was performed following the
standard protocol without IV contrast.

[Series 3: chest wo · axial · 0.61mm/px · z∈[+1310,+1560]mm · 12 of 149 slices shown, 15 images]
[im 12/149  mediastinal]
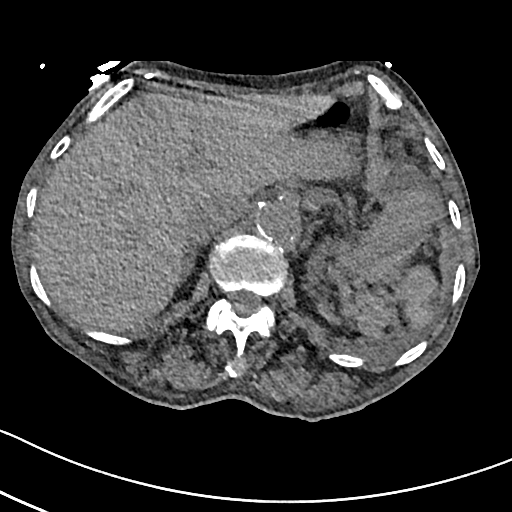
[im 12/149  lung]
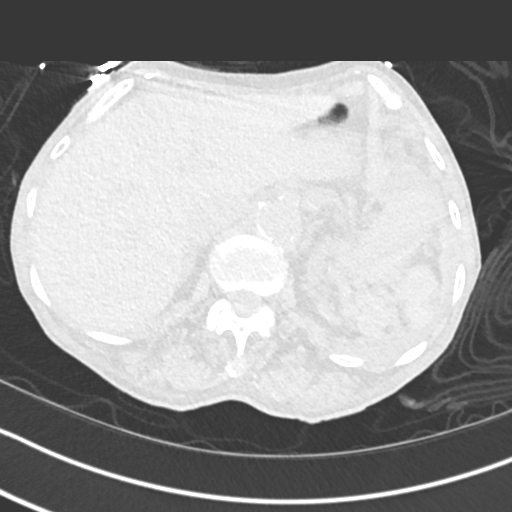
[im 23/149  lung]
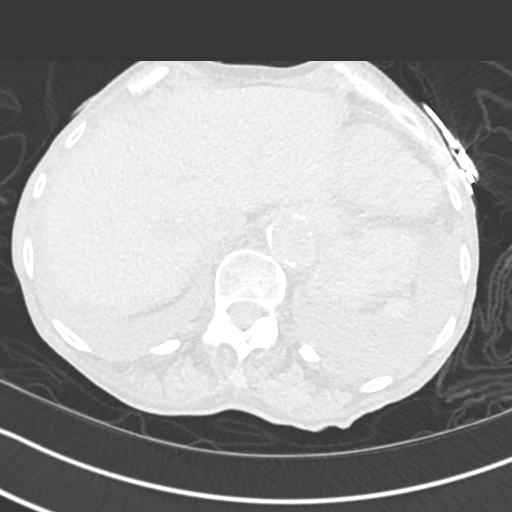
[im 35/149  lung]
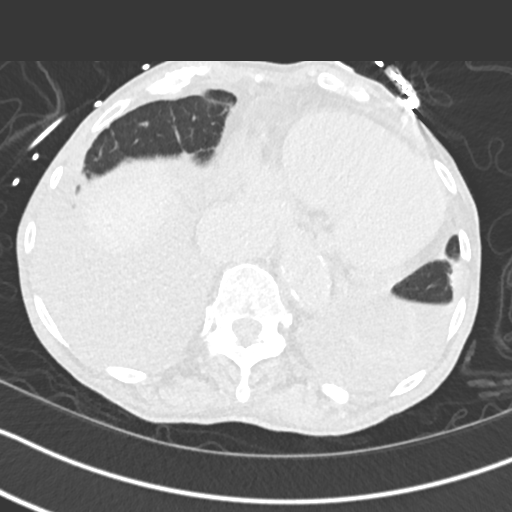
[im 46/149  lung]
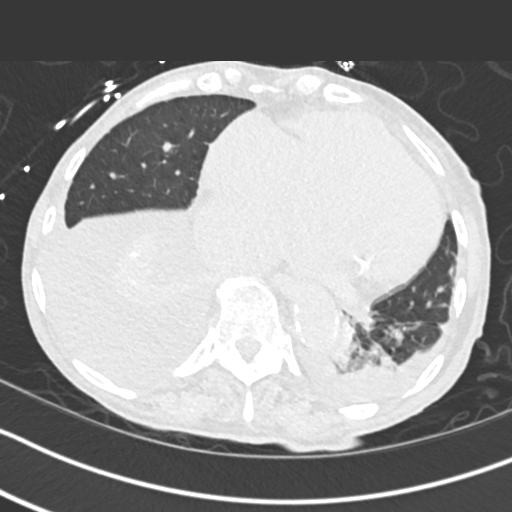
[im 57/149  mediastinal]
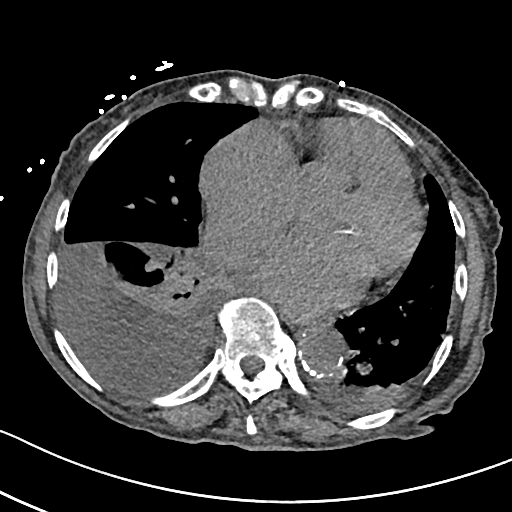
[im 57/149  lung]
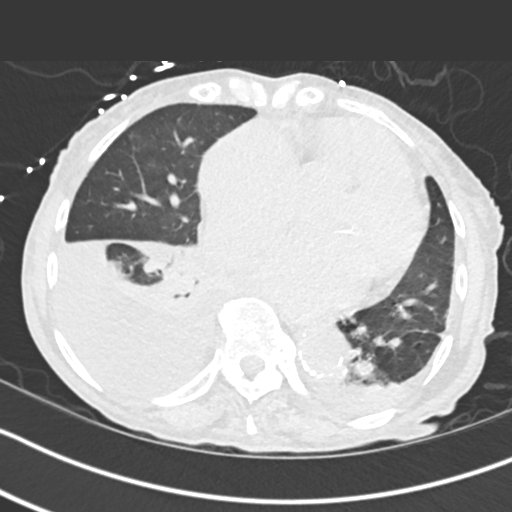
[im 69/149  lung]
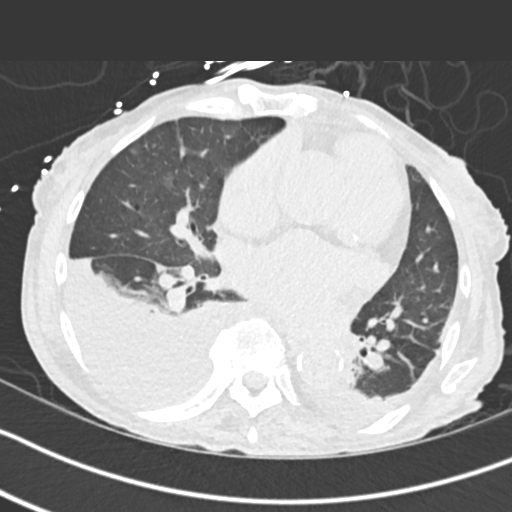
[im 80/149  lung]
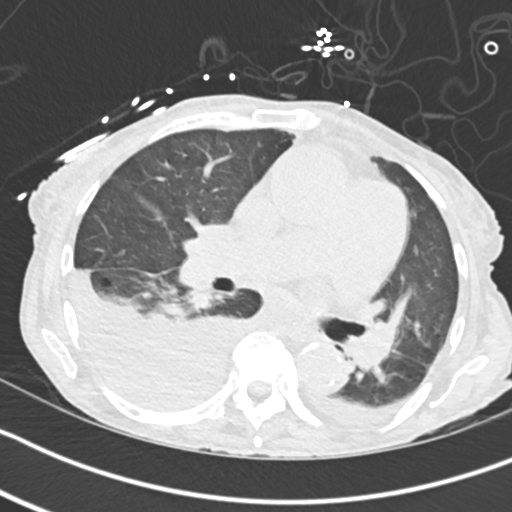
[im 92/149  lung]
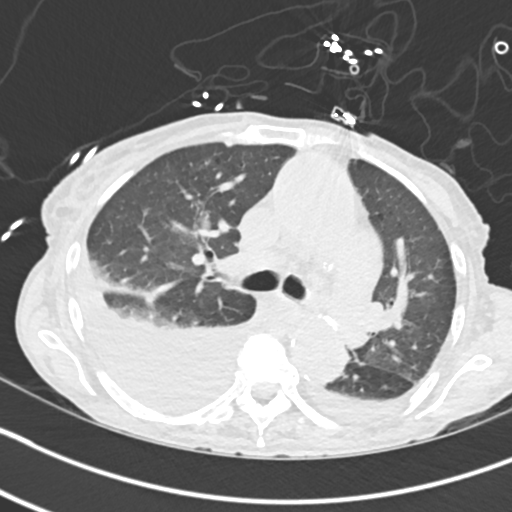
[im 103/149  mediastinal]
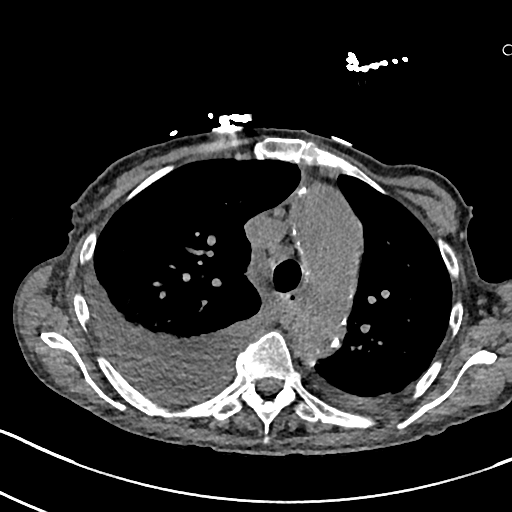
[im 103/149  lung]
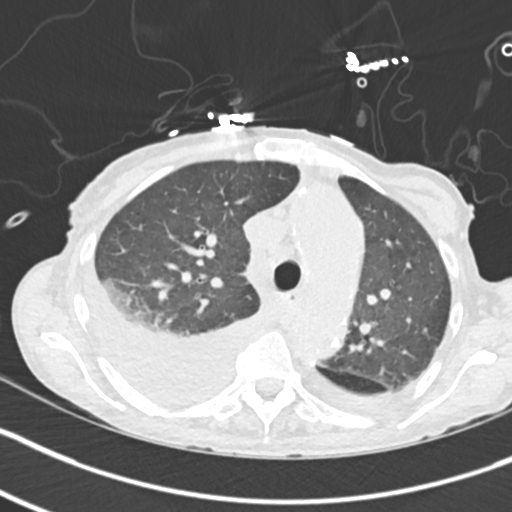
[im 114/149  lung]
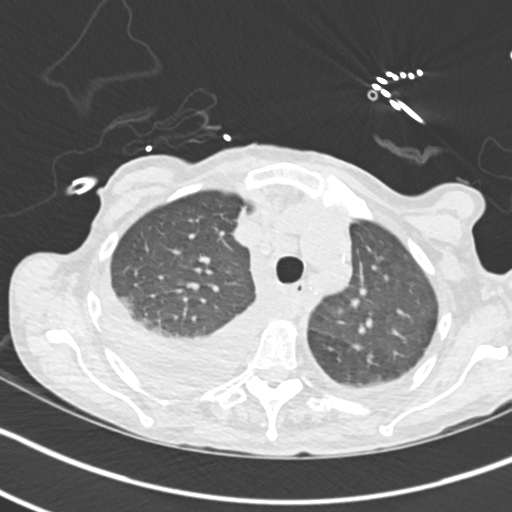
[im 126/149  lung]
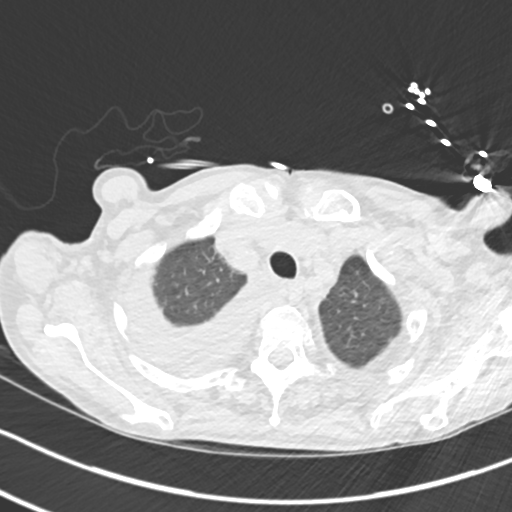
[im 137/149  lung]
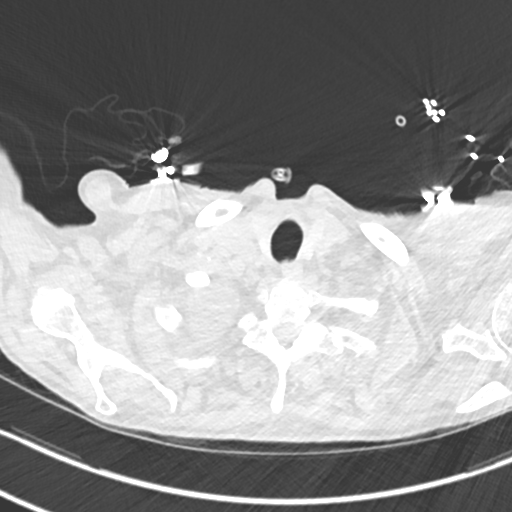

[Series 6: cor · coronal · 0.62mm/px · 3 of 116 slices shown]
[im 24/116  lung]
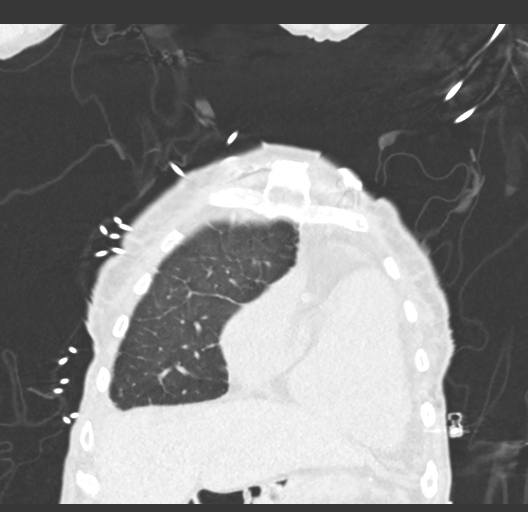
[im 47/116  lung]
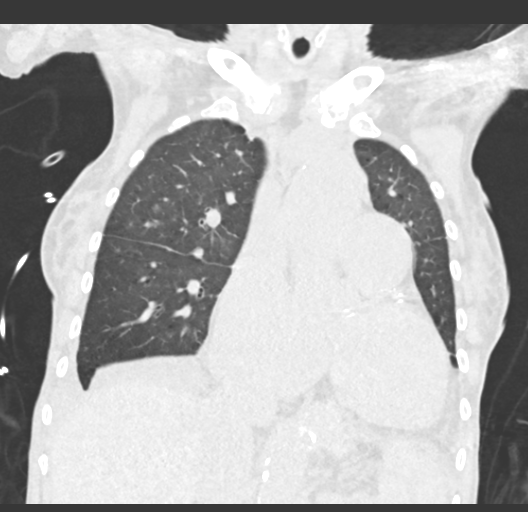
[im 70/116  lung]
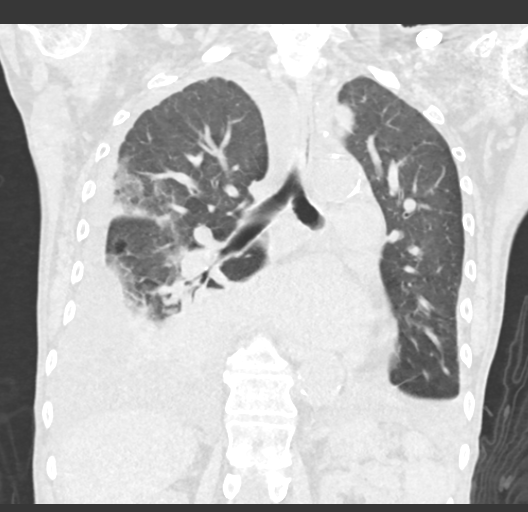

[15 of 36 positions shown; findings below may reference images not displayed]

FINDINGS: Cardiovascular: Severe cardiomegaly. There are coronary artery
calcifications and moderate aortic atherosclerosis. The main
pulmonary artery is enlarged, measuring 4.5 cm at the bifurcation.
Unchanged 4.8 cm ascending thoracic aortic aneurysm.

Mediastinum/Nodes: Visualized thyroid is normal. There is no
mediastinal or axillary lymphadenopathy.

Lungs/Pleura: Large right and small left pleural effusions with
associated atelectasis. There is at least 50% collapse of the right
lower lobe. Pleural effusions are larger than on the prior CT of
01/11/2017. There is otherwise no consolidation. No pulmonary edema.

Upper Abdomen: The unenhanced appearance of the visible portions of
the upper abdominal organs is normal.

Musculoskeletal: No acute abnormality or bony spinal canal stenosis.
IMPRESSION: 1. Large right and small left pleural effusions with associated
atelectasis. No other airspace disease.
2. Enlarged main pulmonary artery, consistent with pulmonary
hypertension.
3. Unchanged 4.8 cm ascending thoracic aortic aneurysm. Recommend
semi-annual imaging followup by CTA or MRA and referral to
cardiothoracic surgery if not already obtained.
4. Cardiomegaly with coronary artery and aortic atherosclerosis
(SHQY4-X13.3).

The above recommendation follows 3424
ACCF/AHA/AATS/ACR/ASA/SCA/ZETNALLI/AGAETISSON/ADRIAO/LICEA Guidelines for the
Diagnosis and Management of Patients With Thoracic Aortic Disease.
Circulation. 3424; 121: [id number]

## 2020-01-01 IMAGING — DX DG CHEST 1V
1 series · 1 of 1 positions shown · non-contrast
Comparison: Chest x-ray dated 02/09/2018.

CLINICAL DATA: Status post thoracentesis.

EXAM:
CHEST  1 VIEW

[chest ap]
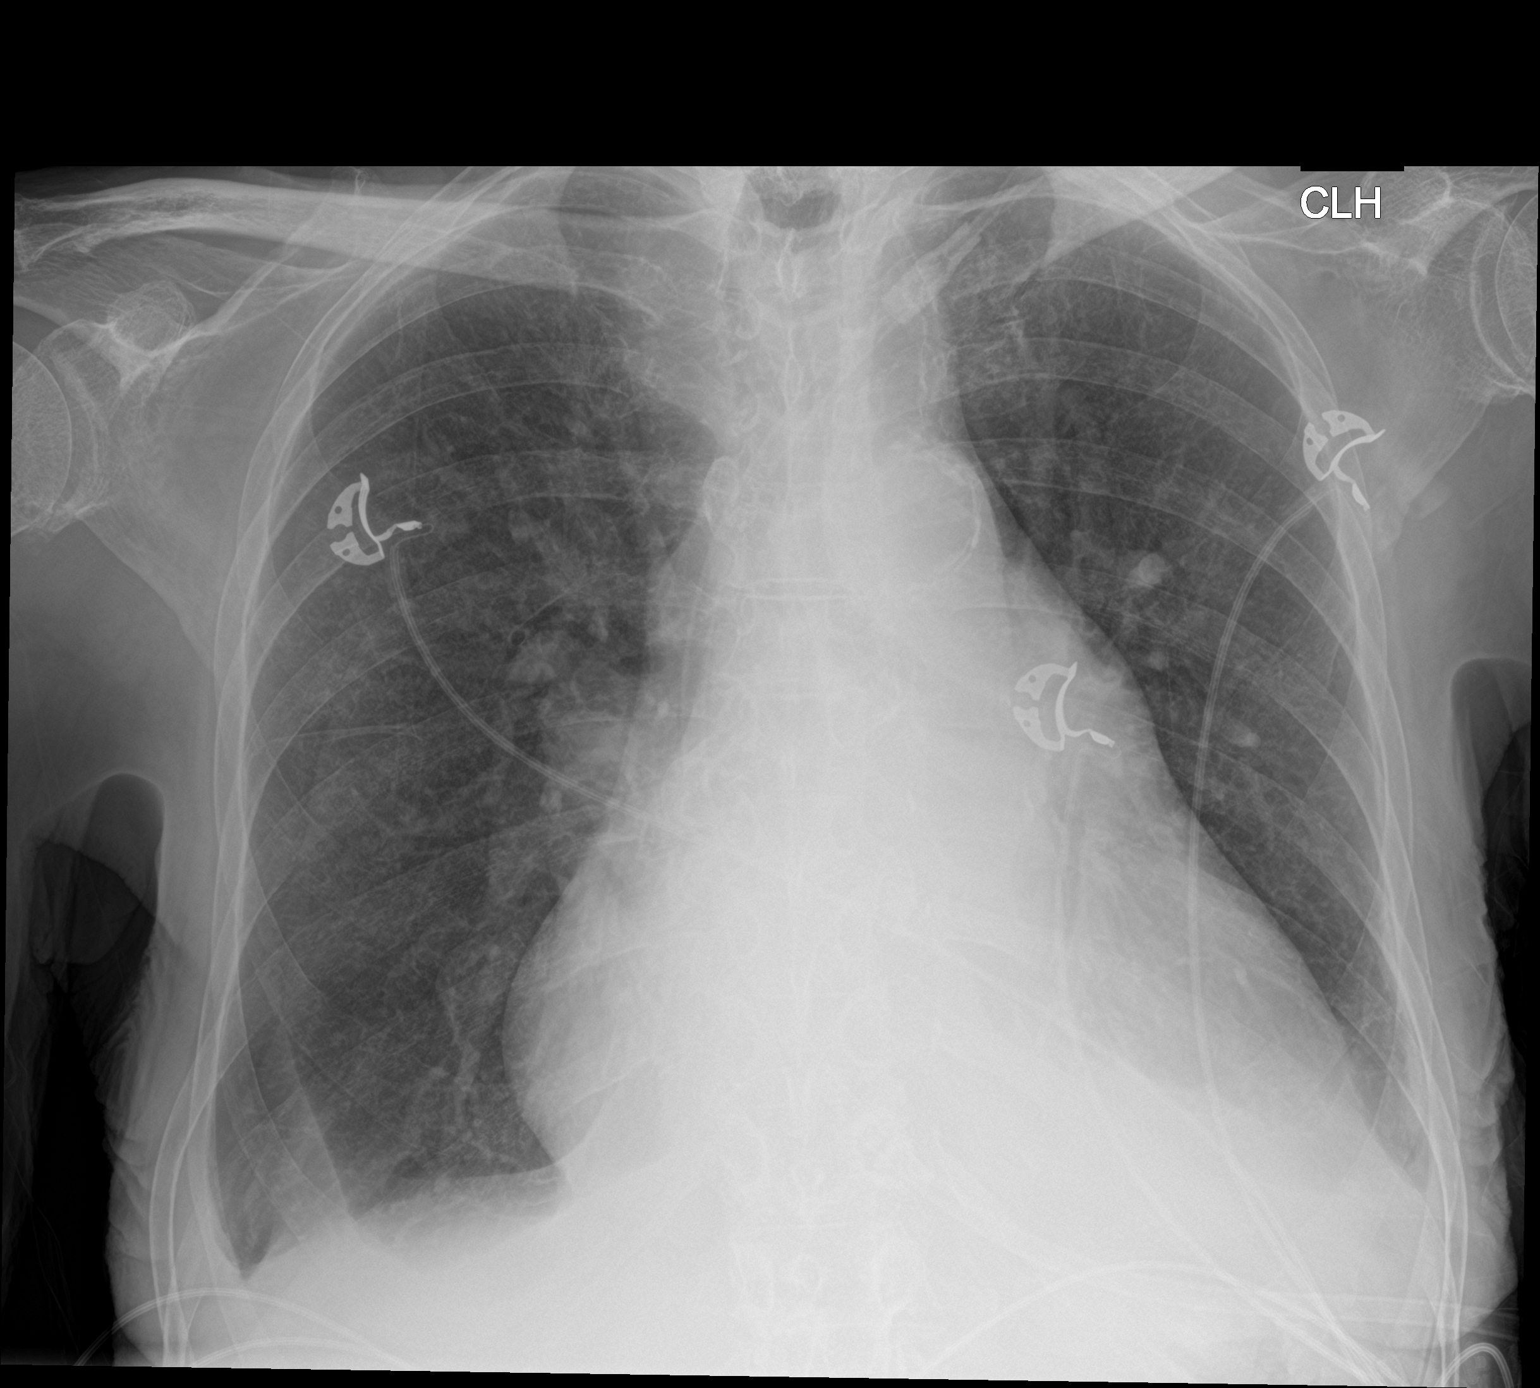

[1 of 1 positions shown; findings below may reference images not displayed]

FINDINGS: Improved aeration at the RIGHT lung base status post thoracentesis.
No pneumothorax seen. Stable small LEFT pleural effusion and/or
atelectasis. Stable cardiomegaly.
IMPRESSION: Decreased RIGHT pleural effusion status post thoracentesis. No
pneumothorax seen.

## 2020-01-01 IMAGING — US IR THORACENTESIS ASP PLEURAL SPACE W/IMG GUIDE
1 series · 1 of 1 positions shown · non-contrast
Comparison: none

INDICATION: Worsening SHOB x 3-4 months; hx diastolic CHF with last exacerbation
10/16/17. Small left pleural effusion and large right pleural
effusion on imaging 02/09/18. Request for diagnostic and therapeutic
thoracentesis.

[Series 1: ir (id) (id)/(id)/(id) ir · 1 of 1 slices shown]
[im 1/1]
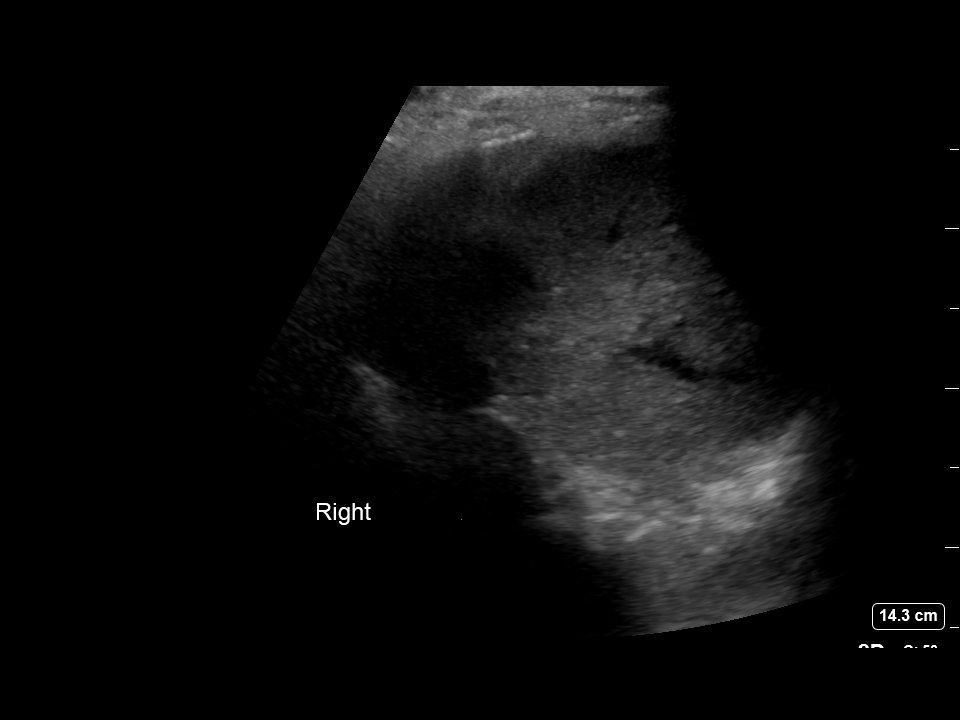

[1 of 1 positions shown; findings below may reference images not displayed]

EXAM:
ULTRASOUND GUIDED RIGHT THORACENTESIS

MEDICATIONS:
10 mL 2% lidocaine.

COMPLICATIONS:
None immediate.

PROCEDURE:
An ultrasound guided thoracentesis was thoroughly discussed with the
patient and questions answered. The benefits, risks, alternatives
and complications were also discussed. The patient understands and
wishes to proceed with the procedure. Written consent was obtained.

Ultrasound was performed to localize and mark an adequate pocket of
fluid in the right chest. The area was then prepped and draped in
the normal sterile fashion. 2% Lidocaine was used for local
anesthesia. Under ultrasound guidance a 6 Fr Safe-T-Centesis
catheter was introduced. Thoracentesis was performed. The catheter
was removed and a dressing applied.
FINDINGS: A total of approximately 1.0L of serosanguinous fluid was removed.
Samples were sent to the laboratory as requested by the clinical
team.
IMPRESSION: Successful ultrasound guided right thoracentesis yielding 1.0L of
pleural fluid.

Read by Burrell, Delmis

## 2020-01-03 IMAGING — DX DG CHEST 1V PORT
1 series · 1 of 1 positions shown · non-contrast
Comparison: Post thoracentesis radiograph of February 10, 2018

CLINICAL DATA: Follow-up right pleural effusion. The patient
underwent thoracentesis yesterday.

EXAM:
PORTABLE CHEST 1 VIEW

[chest]
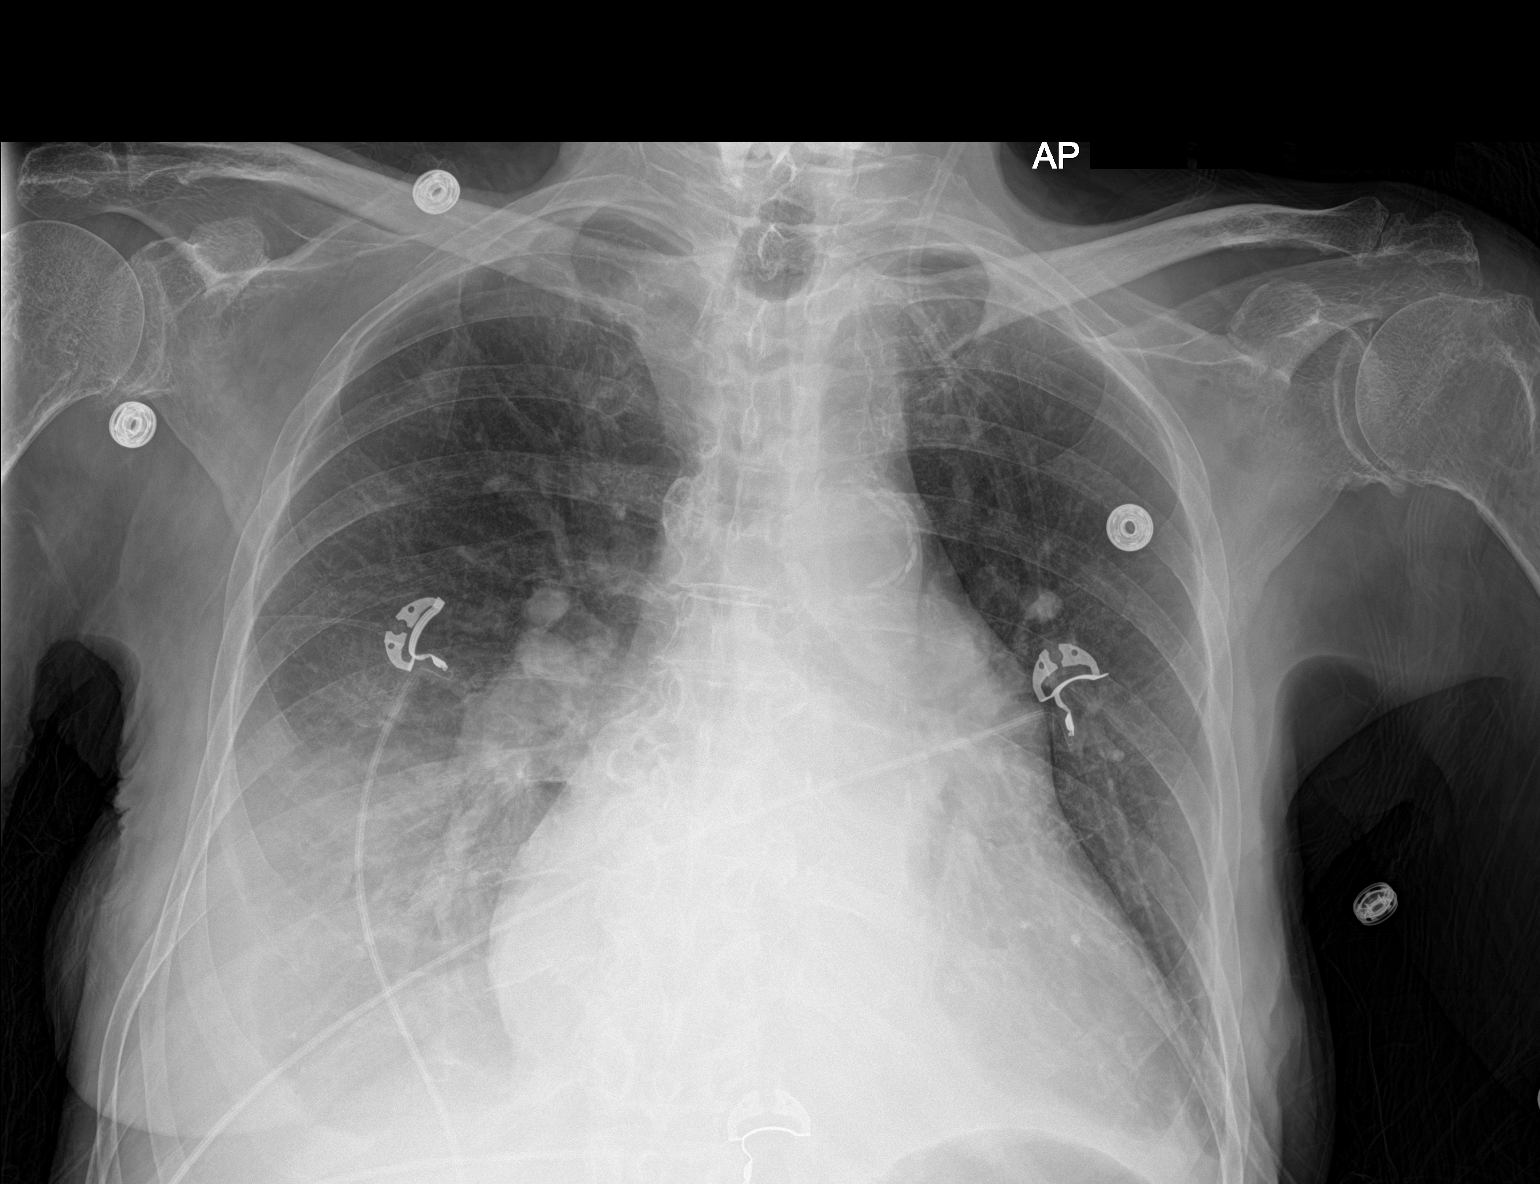

[1 of 1 positions shown; findings below may reference images not displayed]

FINDINGS: There has been interval reaccumulation of a moderate amount of
pleural fluid on the right. A trace of pleural fluid on the left is
present and stable. There is no pneumothorax. The cardiac silhouette
is enlarged. The central pulmonary vascularity is mildly prominent
but stable. There is calcification in the wall of the aortic arch.
The bony thorax exhibits no acute abnormality.
IMPRESSION: Interval re-accumulation of a moderate amount of pleural fluid on
the right.

## 2020-01-17 IMAGING — US IR THORACENTESIS ASP PLEURAL SPACE W/IMG GUIDE
1 series · 3 of 3 positions shown · non-contrast
Comparison: none

INDICATION: Progressive dyspnea. History of diastolic CHF, currently admitted to
[REDACTED] for exacerbation. Bilateral pleural effusions. Request for
diagnostic and therapeutic thoracentesis.

[Series 1: ir (id) (id)/(id)/(id) ir · 3 of 3 slices shown]
[im 1/3]
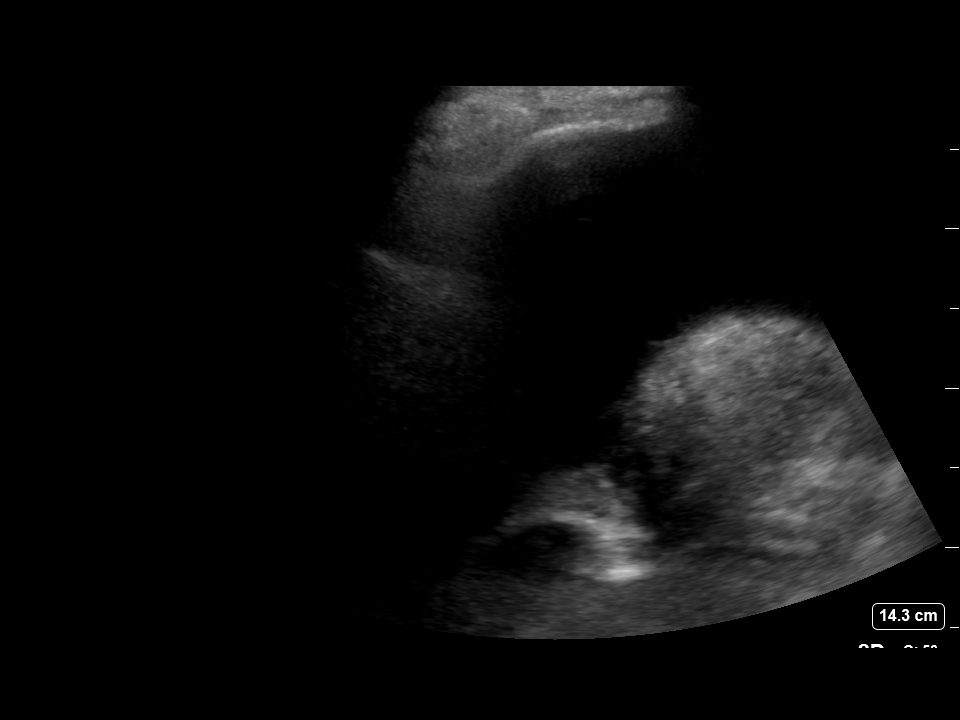
[im 2/3]
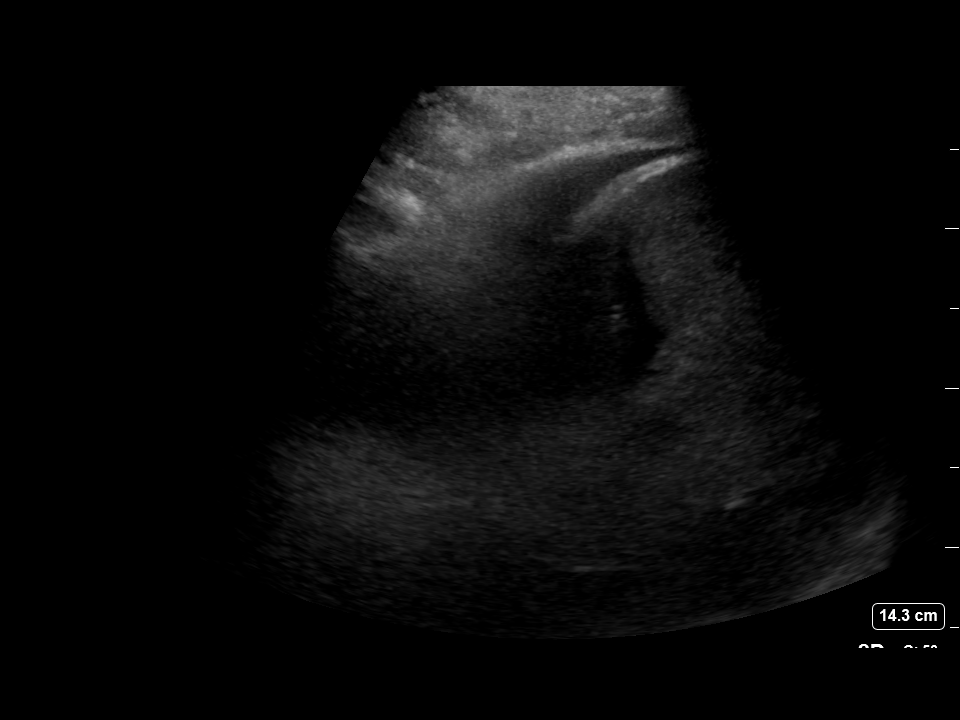
[im 3/3]
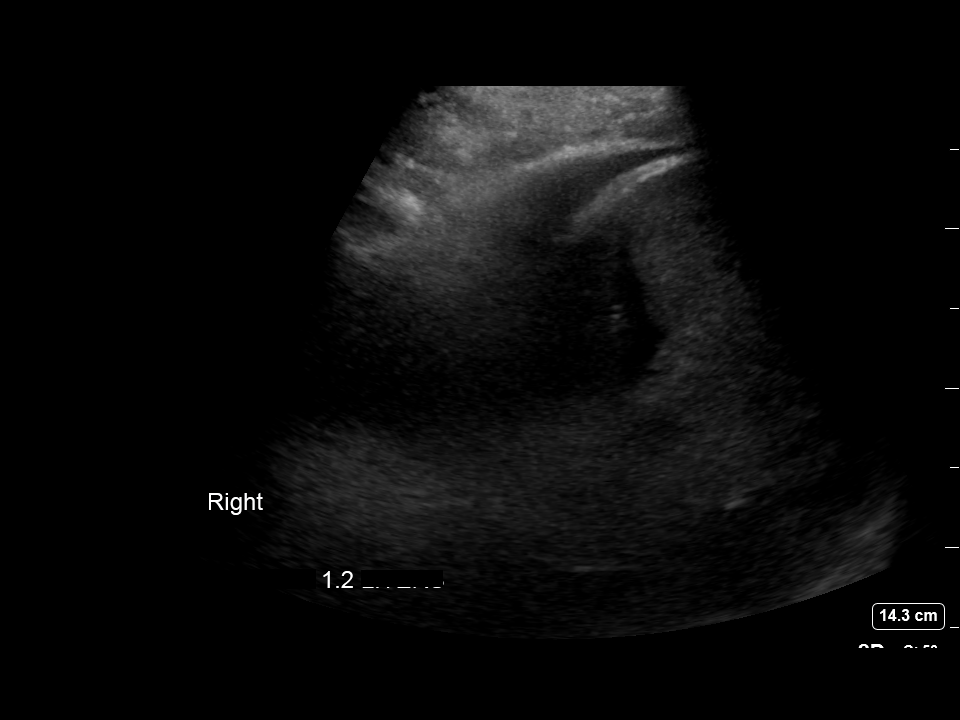

[3 of 3 positions shown; findings below may reference images not displayed]

EXAM:
ULTRASOUND GUIDED RIGHT THORACENTESIS

MEDICATIONS:
15 mL 2% lidocaine.

COMPLICATIONS:
None immediate.

PROCEDURE:
An ultrasound guided thoracentesis was thoroughly discussed with the
patient and questions answered. The benefits, risks, alternatives
and complications were also discussed. The patient understands and
wishes to proceed with the procedure. Written consent was obtained.

Ultrasound was performed to localize and mark an adequate pocket of
fluid in the right chest. The area was then prepped and draped in
the normal sterile fashion. 2% Lidocaine was used for local
anesthesia. Under ultrasound guidance a 6 Fr Safe-T-Centesis
catheter was introduced. Thoracentesis was performed. The catheter
was removed and a dressing applied.
FINDINGS: A total of approximately 1.2L of serosanguineous fluid was removed.
Samples were sent to the laboratory as requested by the clinical
team.
IMPRESSION: Successful ultrasound guided right thoracentesis yielding 1.2L of
pleural fluid.

Read by Saelee, Cornel

## 2020-01-17 IMAGING — DX DG CHEST 1V
1 series · 1 of 1 positions shown · non-contrast
Comparison: Chest x-ray earlier same day [DATE] a.m. and previously.

CLINICAL DATA: Post RIGHT thoracentesis.

EXAM:
CHEST  1 VIEW [DATE] p.m.:

[x chest ap]
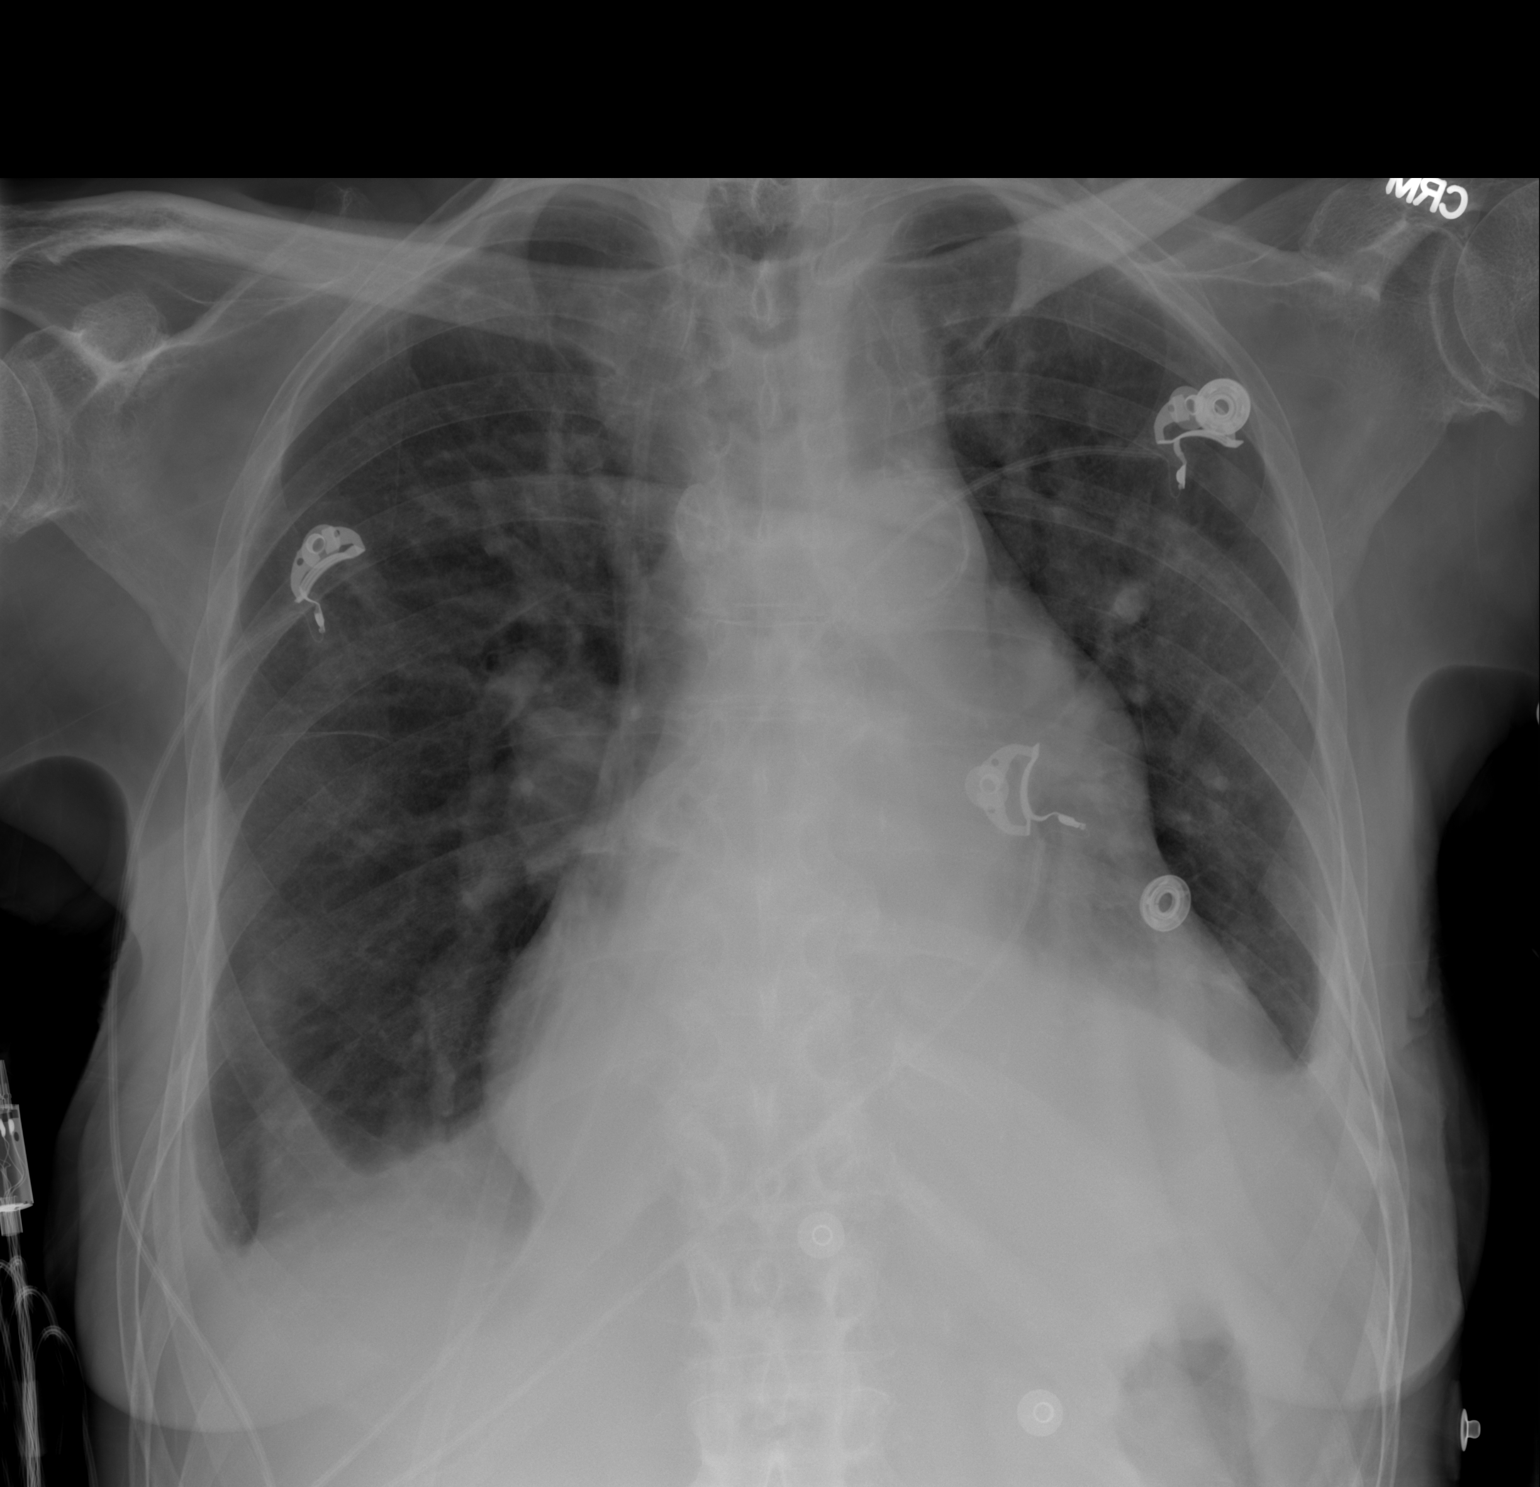

[1 of 1 positions shown; findings below may reference images not displayed]

FINDINGS: No pneumothorax after RIGHT thoracentesis. Small residual RIGHT
pleural effusion, much improved since earlier today. Stable
moderate-sized LEFT pleural effusion. Improved aeration in the RIGHT
LOWER LOBE, with mild to moderate atelectasis persisting. Stable
dense passive atelectasis involving the LEFT LOWER LOBE. Stable
pulmonary venous hypertension without overt edema. Stable moderate
to marked cardiomegaly.
IMPRESSION: 1. No pneumothorax after RIGHT thoracentesis.
2. Small residual RIGHT pleural effusion after thoracentesis.
3. Improved aeration in the RIGHT LOWER LOBE with mild to moderate
atelectasis persisting.
4. Stable moderate size LEFT pleural effusion and associated dense
passive atelectasis involving the LEFT LOWER LOBE.

## 2020-01-17 IMAGING — DX DG CHEST 1V PORT
1 series · 1 of 1 positions shown · non-contrast
Comparison: Radiograph February 12, 2018.

CLINICAL DATA: Shortness of breath.

EXAM:
PORTABLE CHEST 1 VIEW

[chest ap]
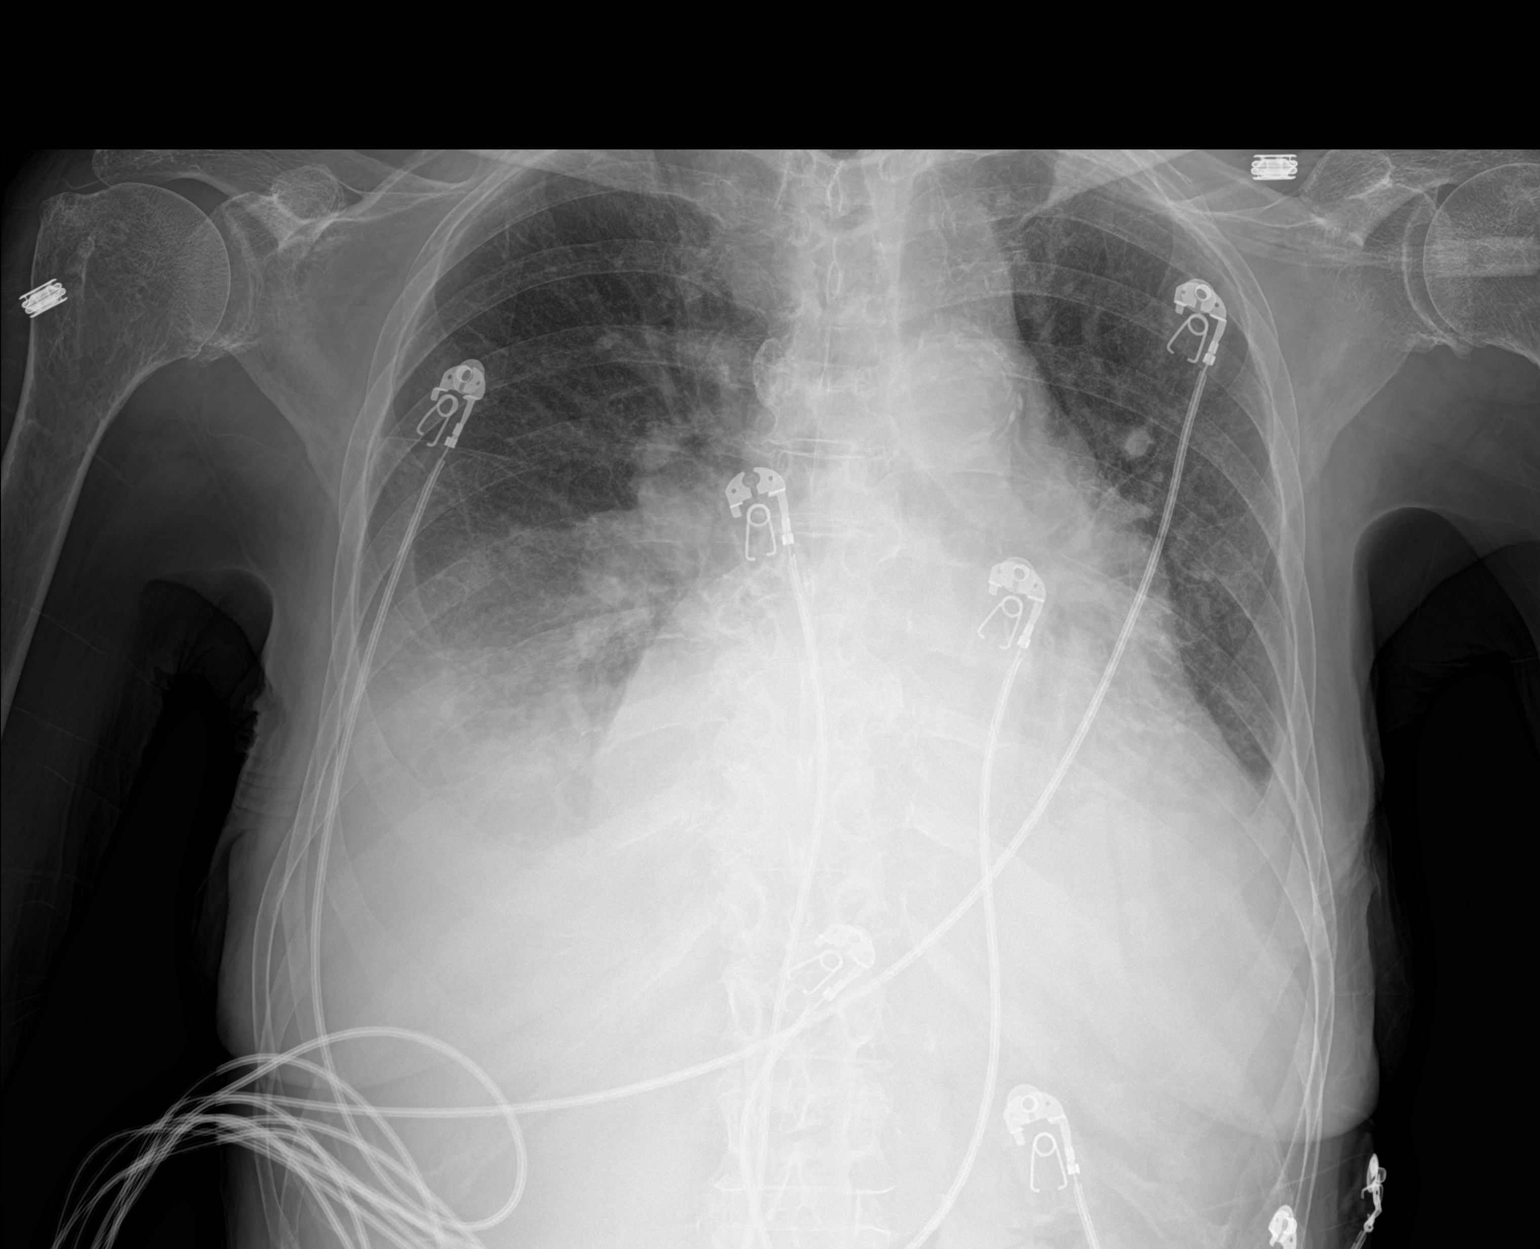

[1 of 1 positions shown; findings below may reference images not displayed]

FINDINGS: Stable cardiomegaly. Atherosclerosis of thoracic aorta is noted. No
pneumothorax is noted. Increased bibasilar opacities are noted
concerning for pleural effusions with associated atelectasis, right
greater than left. Bony thorax is unremarkable.
IMPRESSION: Increased bibasilar opacities are noted concerning for pleural
effusions with associated atelectasis, right greater than left.

Aortic Atherosclerosis (04R80-PG7.7).

## 2020-01-19 IMAGING — DX DG CHEST 2V
2 series · 2 of 2 positions shown · non-contrast
Comparison: 02/26/2018

CLINICAL DATA: Follow-up pleural effusions

EXAM:
CHEST - 2 VIEW

[chest lat]
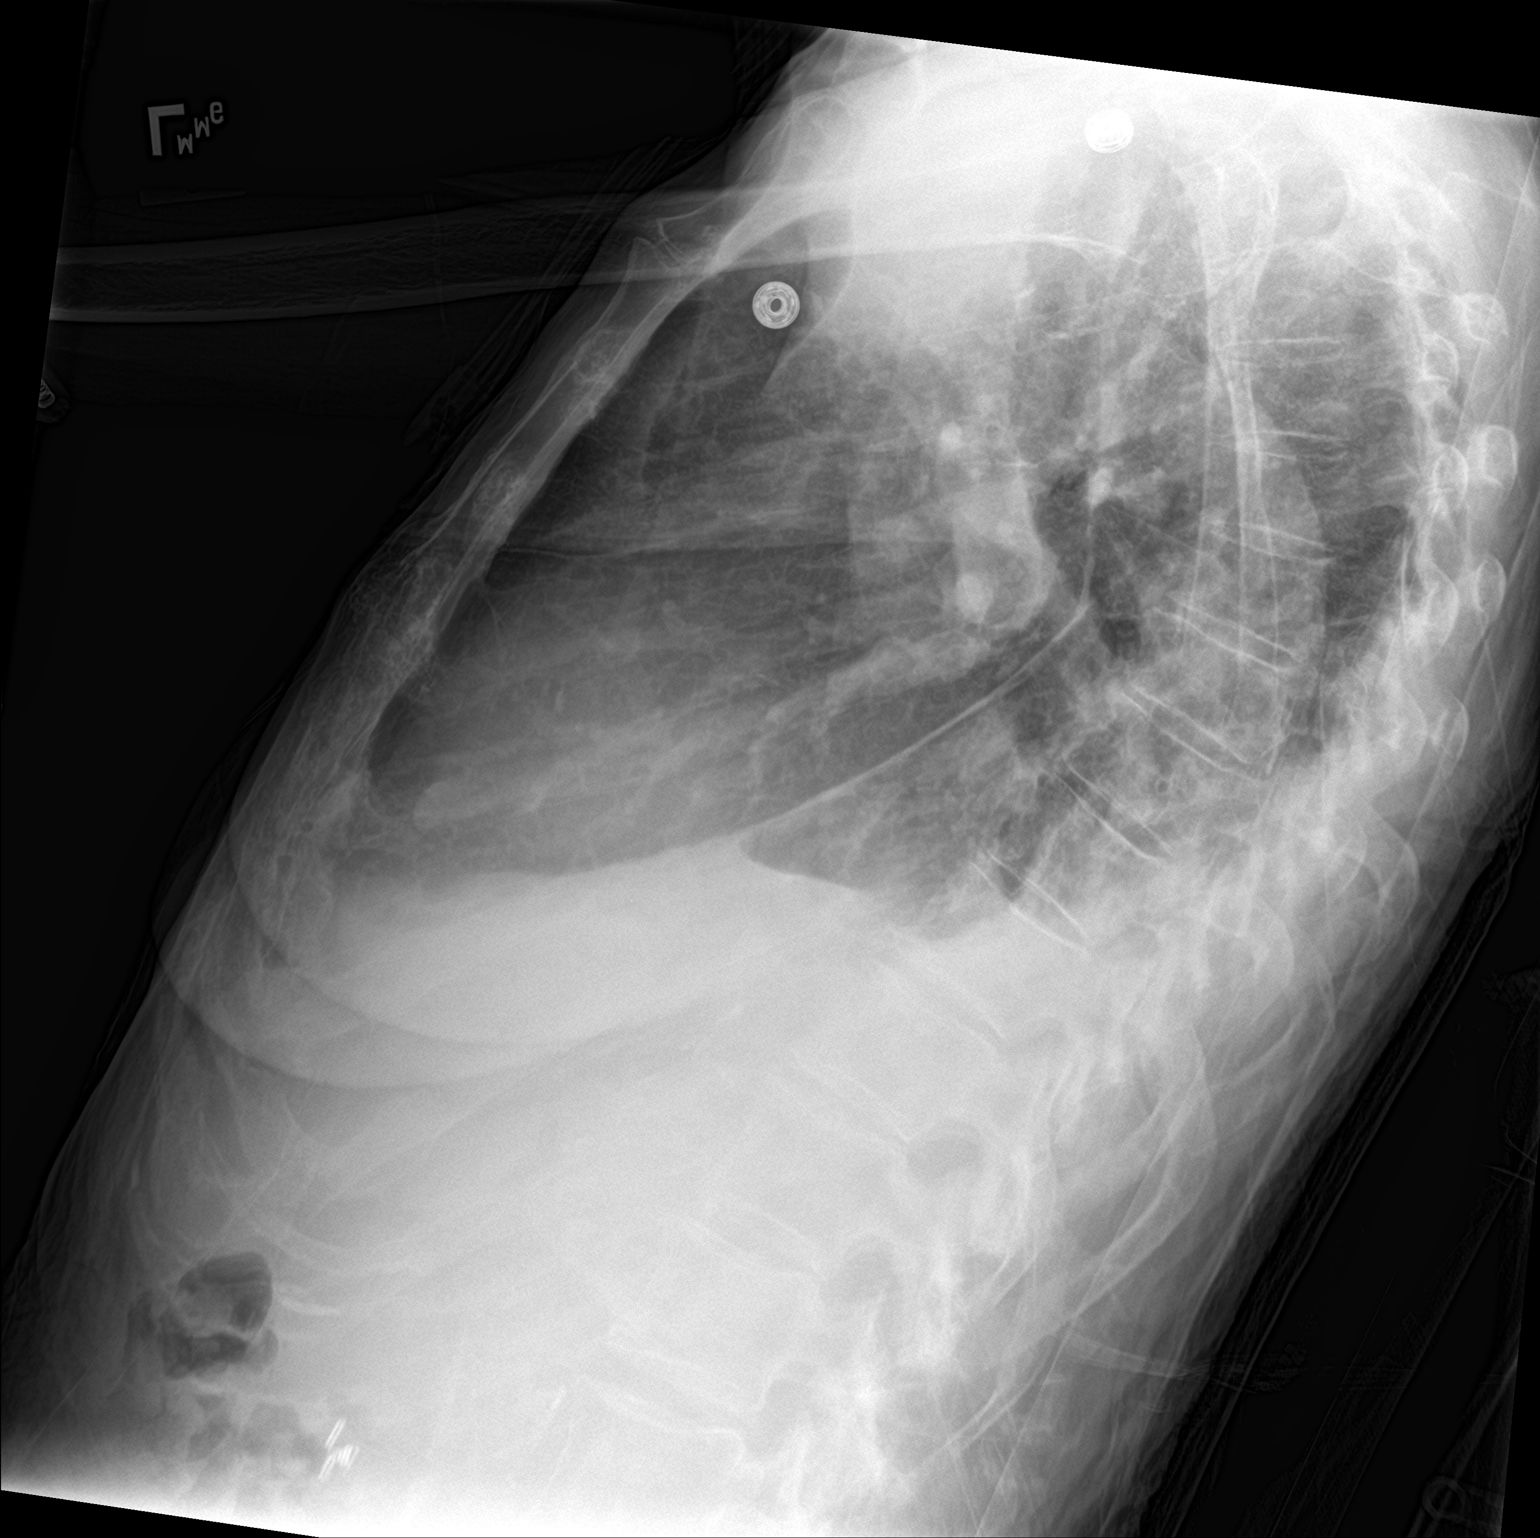

[chest ap]
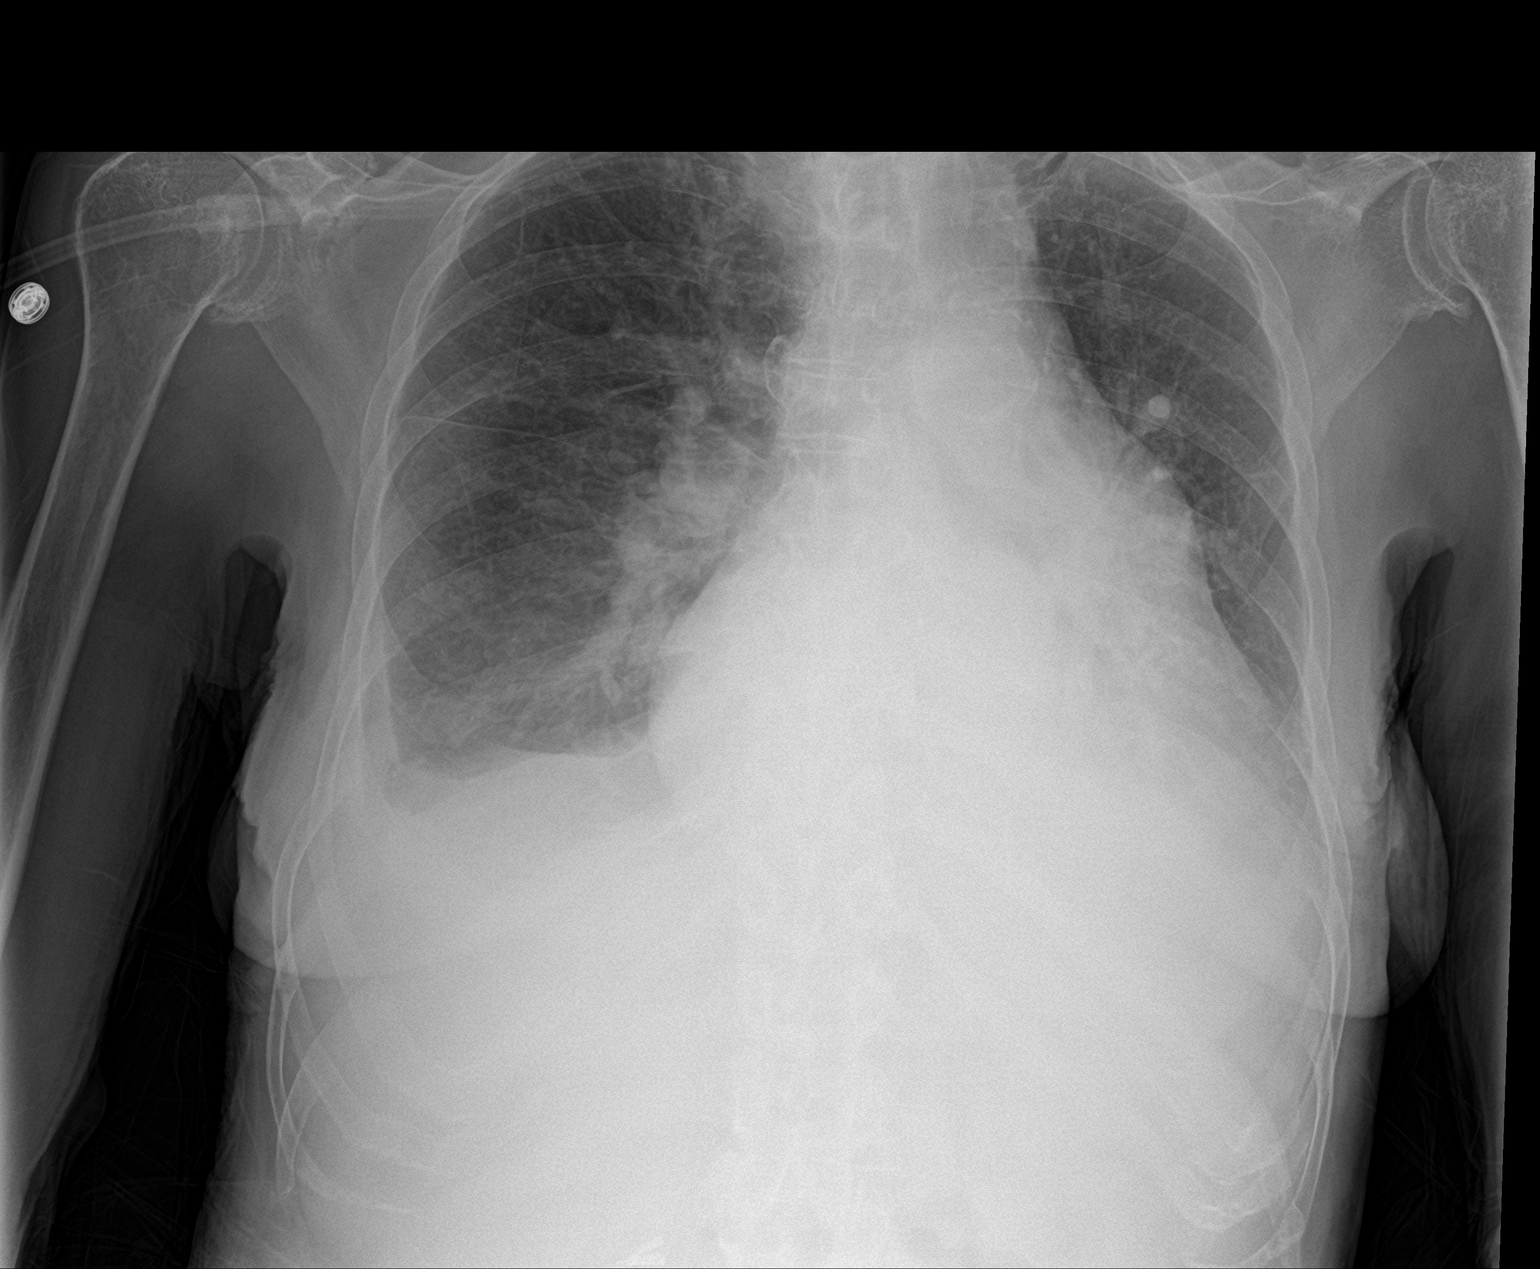

[2 of 2 positions shown; findings below may reference images not displayed]

FINDINGS: Cardiac shadow remains enlarged. Bilateral pleural effusions are
again identified and stable. Mild central vascular congestion is
noted. No pneumothorax is seen.
IMPRESSION: Small bilateral pleural effusions.

Mild vascular congestion is noted.

## 2020-02-05 IMAGING — DX DG CHEST 1V PORT
1 series · 1 of 1 positions shown · non-contrast
Comparison: Chest radiograph February 28, 2018

CLINICAL DATA: Acute dyspnea.

EXAM:
PORTABLE CHEST 1 VIEW

[chest ap]
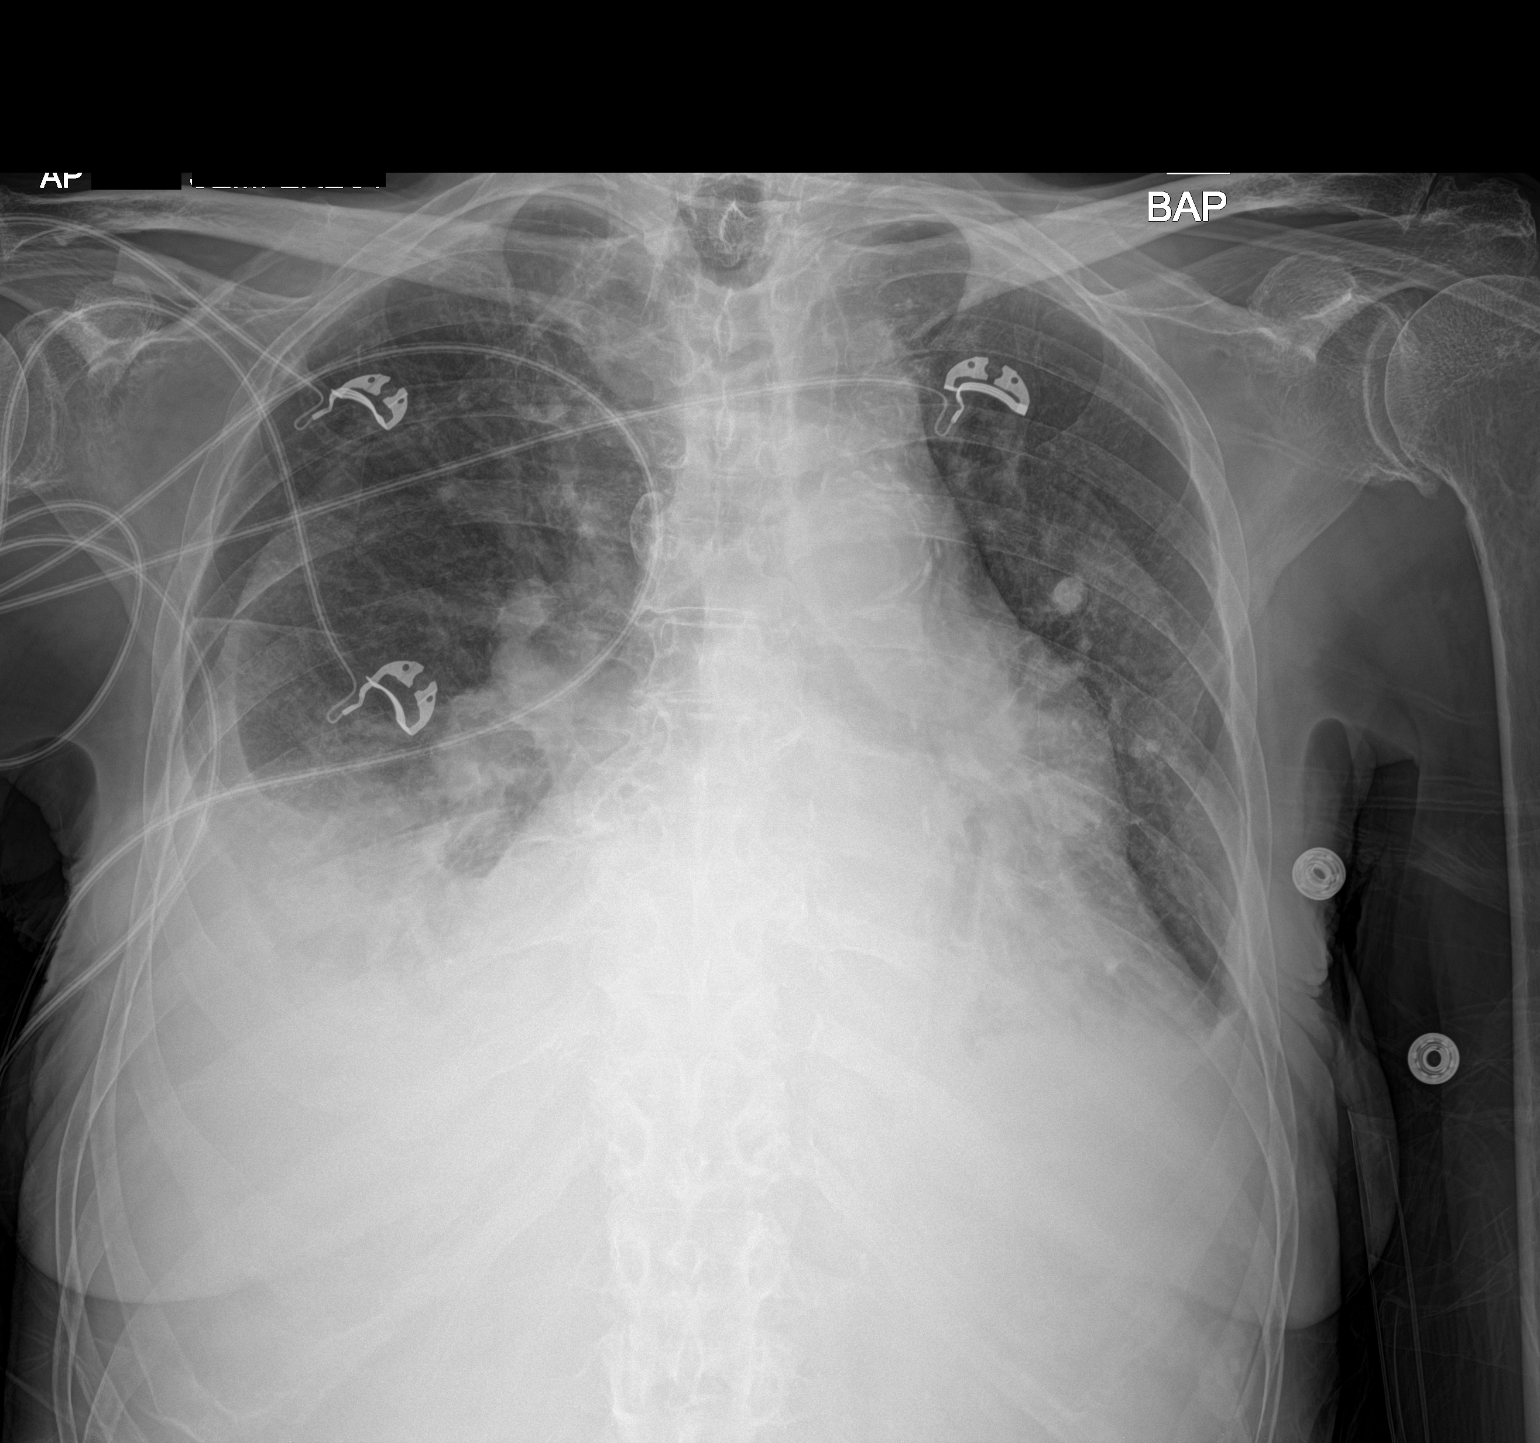

[1 of 1 positions shown; findings below may reference images not displayed]

FINDINGS: Increasing cardiomegaly. Calcified aortic arch. Pulmonary vascular
congestion diffuse interstitial prominence. Increased moderate
RIGHT, stable small LEFT pleural effusions with underlying
consolidation. Scattered calcified granulomas. No pneumothorax.
Osteopenia.
IMPRESSION: 1. Cardiomegaly with increasing pulmonary edema.
2. Increased moderate RIGHT and small LEFT pleural effusions.
Underlying consolidation.
3.  Aortic Atherosclerosis ([id number]).

## 2020-02-07 IMAGING — MR MR MRA HEAD W/O CM
9 of 11 series · 28 of 48 positions shown · non-contrast
Comparison: Prior CT from earlier the same day as well as previous
MRI from 05/23/2017.

CLINICAL DATA: Follow-up examination for acute stroke, intracranial
hemorrhage.

EXAM:
MRI HEAD WITHOUT CONTRAST
MRA HEAD WITHOUT CONTRAST
TECHNIQUE: Multiplanar, multiecho pulse sequences of the brain and surrounding
structures were obtained without intravenous contrast. Angiographic
images of the head were obtained using MRA technique without
contrast.

[Series 3: DWI · axial · 3.0mm · 0.94mm/px · z∈[-92,+46]mm · 7 of 94 slices shown (1 of 2)]
[im 1/94]
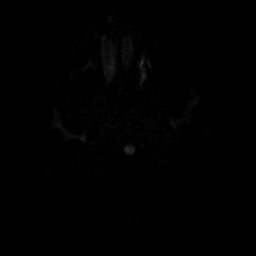
[im 16/94]
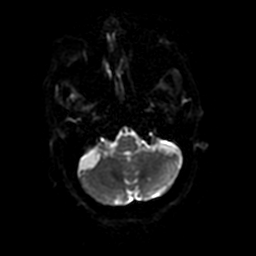
[im 32/94]
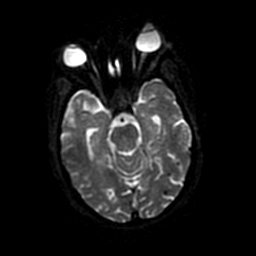
[im 47/94]
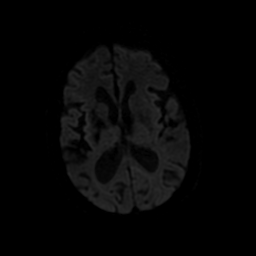
[im 63/94]
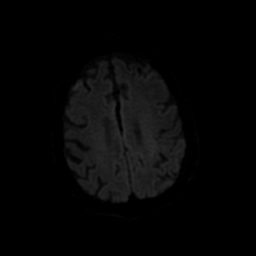
[im 78/94]
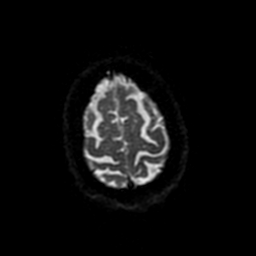
[im 94/94]
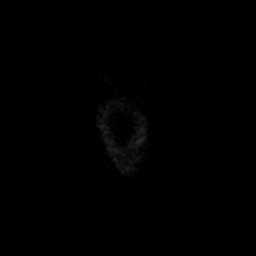

[Series 4: ax (id) 2 · axial · 1.0mm · 0.43mm/px · z∈[-91,-60]mm · 3 of 184 slices shown]
[im 1/184]
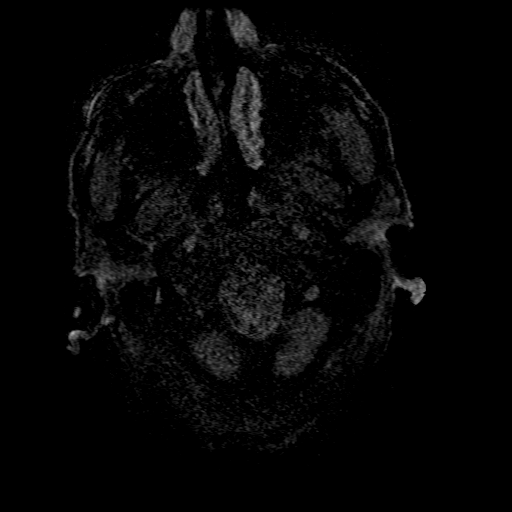
[im 31/184]
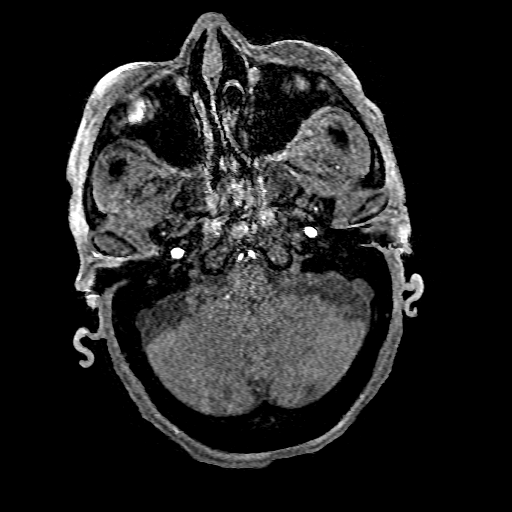
[im 62/184]
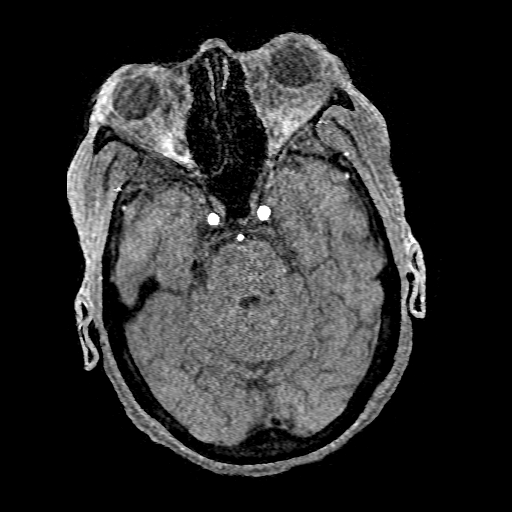

[Series 5: DWI · coronal · 4.0mm · 0.94mm/px · 5 of 66 slices shown (2 of 2)]
[im 1/66]
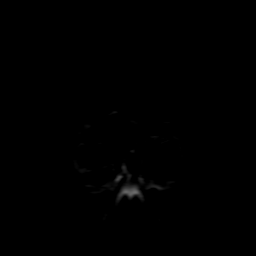
[im 17/66]
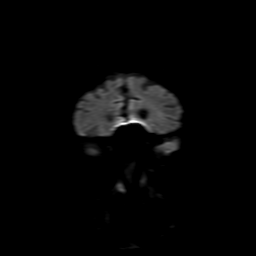
[im 33/66]
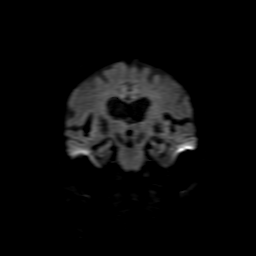
[im 49/66]
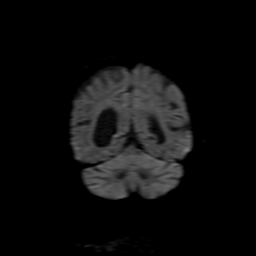
[im 66/66]
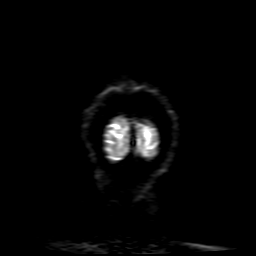

[Series 6: FLAIR · sagittal · 5.0mm · 0.47mm/px · 2 of 24 slices shown (1 of 2)]
[im 1/24]
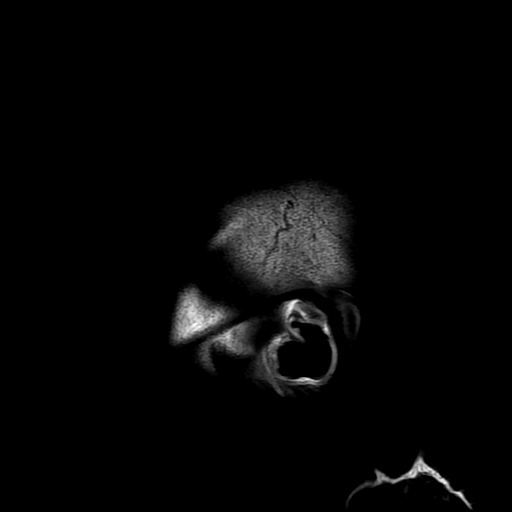
[im 24/24]
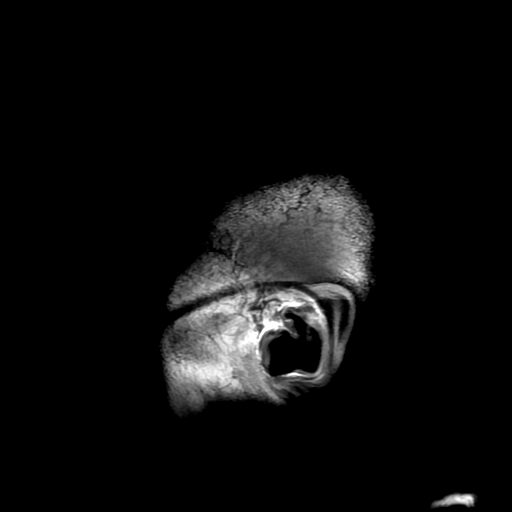

[Series 7: T2 · axial · 5.0mm · 0.47mm/px · z∈[-86,+52]mm · 2 of 24 slices shown (1 of 2)]
[im 1/24]
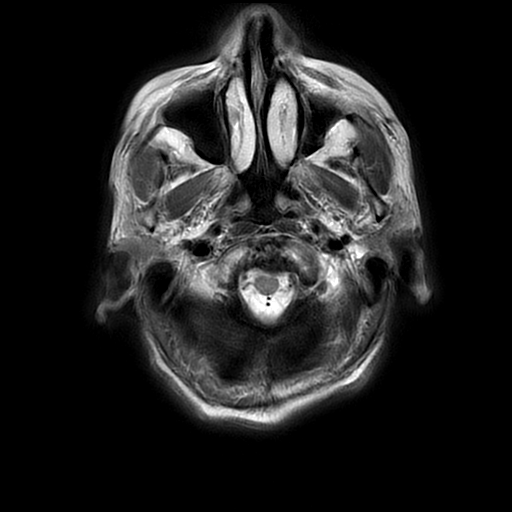
[im 24/24]
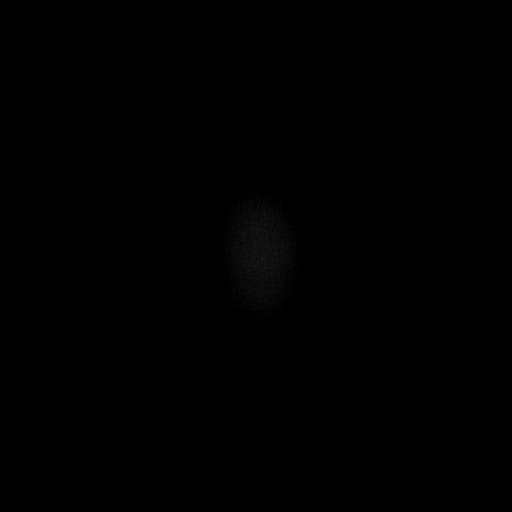

[Series 8: FLAIR · axial · 3.0mm · 0.47mm/px · z∈[-86,+52]mm · 2 of 24 slices shown (2 of 2)]
[im 1/24]
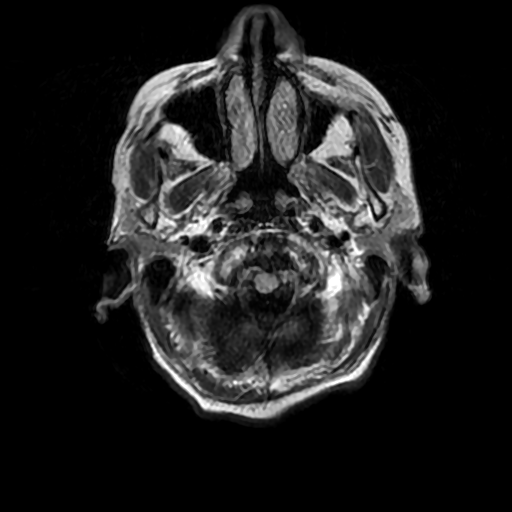
[im 24/24]
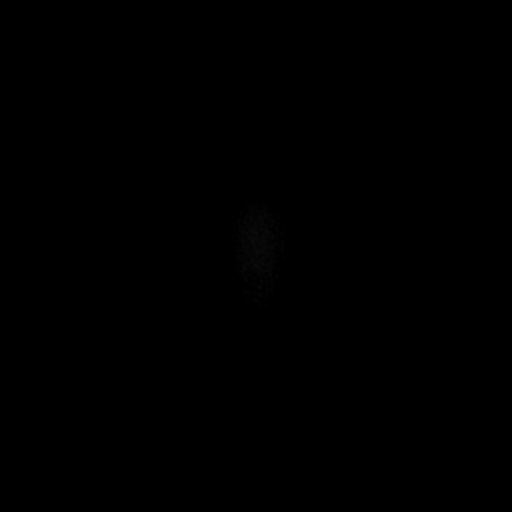

[Series 11: T2 · coronal · 5.0mm · 0.39mm/px · 2 of 27 slices shown (2 of 2)]
[im 1/27]
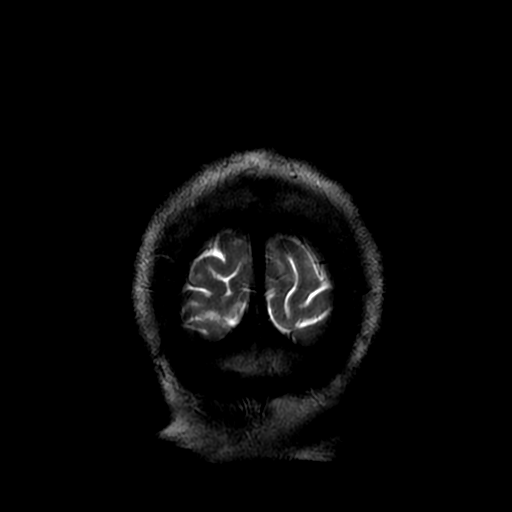
[im 27/27]
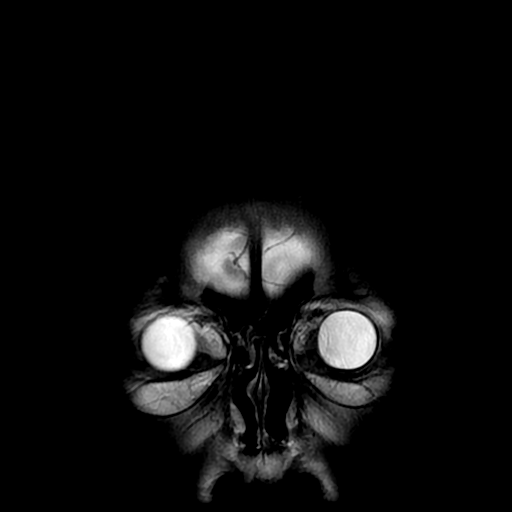

[Series 350: ADC · axial · 3.0mm · 0.94mm/px · z∈[-92,+46]mm · 3 of 47 slices shown (1 of 2)]
[im 1/47]
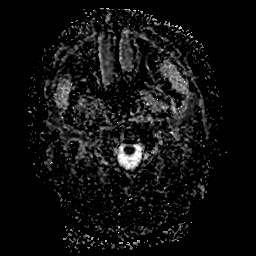
[im 24/47]
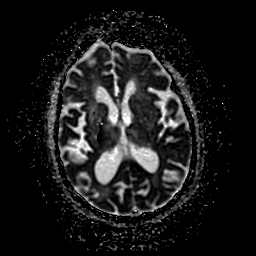
[im 47/47]
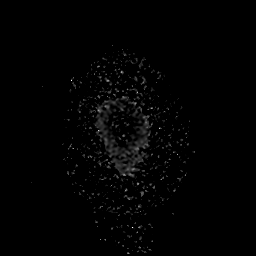

[Series 550: ADC · coronal · 4.0mm · 0.94mm/px · 2 of 33 slices shown (2 of 2)]
[im 1/33]
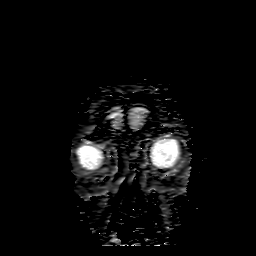
[im 33/33]
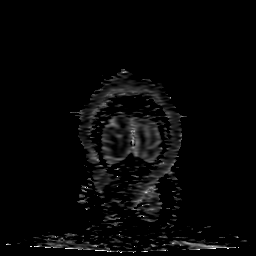

[28 of 48 positions shown; findings below may reference images not displayed]

FINDINGS: MRI HEAD FINDINGS

Brain: Moderately advanced cerebral atrophy with advanced chronic
microvascular ischemic disease, similar to previous. Multiple
scatter remote lacunar infarcts present within the bilateral basal
ganglia and thalami. Chronic microvascular ischemic changes present
within the pons. Probable small remote cortical infarcts involving
the left frontal and parietal cortices. Multiple foci of chronic
hemosiderin staining seen about several of these infarcts, with
additional foci seen scattered throughout the supratentorial and
infratentorial brain. Changes could be hypertensive in nature or
related to cerebral amyloid angiopathy or a combination there of.

Focal susceptibility artifacts seen associated with the previously
identified superior right thalamic hemorrhage. No significant mass
effect or edema. No other evidence for acute or subacute infarct.
Gray-white matter differentiation otherwise maintained. No other
evidence for acute intracranial hemorrhage.

No mass lesion, midline shift or mass effect. Diffuse ventricular
prominence related to global parenchymal volume loss of
hydrocephalus. No extra-axial fluid collection. Pituitary gland
normal.

Vascular: Major intracranial vascular flow voids are maintained.

Skull and upper cervical spine: Craniocervical junction normal.
Multilevel degenerative spondylolysis noted within the upper
cervical spine without significant stenosis. Bone marrow signal
intensity normal. A scalp soft tissue abnormality.

Sinuses/Orbits: Patient status post bilateral ocular lens
replacement. Left sphenoid sinus retention cyst. Paranasal sinuses
and mastoid air cells are otherwise clear. Inner ear structures
grossly normal.

Other: None.

MRA HEAD FINDINGS

ANTERIOR CIRCULATION:

Distal cervical segments of the internal carotid arteries are patent
with antegrade flow. Petrous segments patent bilaterally. Mild
atheromatous irregularity within the cavernous/supraclinoid ICAs
without hemodynamically significant stenosis. A1 segments patent
bilaterally. Normal anterior communicating artery. Anterior cerebral
arteries demonstrate mild atheromatous irregularity but are patent
to their distal aspects without high-grade stenosis. M1 segments
patent bilaterally without stenosis. No proximal M2 occlusion.
Distal small vessel atheromatous irregularity involving the MCA
branches bilaterally.

POSTERIOR CIRCULATION:

Vertebral arteries patent to the vertebrobasilar junction without
stenosis. Partially visualized posterior inferior cerebral arteries
patent bilaterally. Basilar patent to its distal aspect without
stenosis. Superior cerebral arteries patent bilaterally. Predominant
fetal type origin of the PCAs bilaterally supplied via widely patent
posterior communicating arteries. P2 segments widely patent
bilaterally. Distal PCA branches not well seen on this exam, which
may be related to high-grade stenoses and/or occlusion.

No intracranial aneurysm.
IMPRESSION: MRI HEAD IMPRESSION:

1. Stable 5 mm superior right thalamic intraparenchymal hemorrhage
without mass effect.
2. No other acute intracranial abnormality identified.
3. Advanced atrophy with chronic small vessel ischemic disease with
multiple chronic infarcts as above.
4. Innumerable chronic micro hemorrhages involving the
supratentorial and infratentorial brain as above. Findings could
reflect sequelae of chronic hypertensive encephalopathy and/or
cerebral amyloid angiopathy (or a combination there of).

MRA HEAD IMPRESSION:

1. Negative intracranial MRA for large vessel occlusion. No proximal
high-grade or correctable stenosis identified.
2. Moderate distal small vessel atheromatous disease.

## 2020-02-07 IMAGING — DX DG CHEST 1V
1 series · 1 of 1 positions shown · non-contrast
Comparison: Portable chest 03/17/2018 and earlier.

CLINICAL DATA: 86-year-old female status post ultrasound-guided
thoracentesis this morning.

EXAM:
CHEST  1 VIEW

[x chest ap]
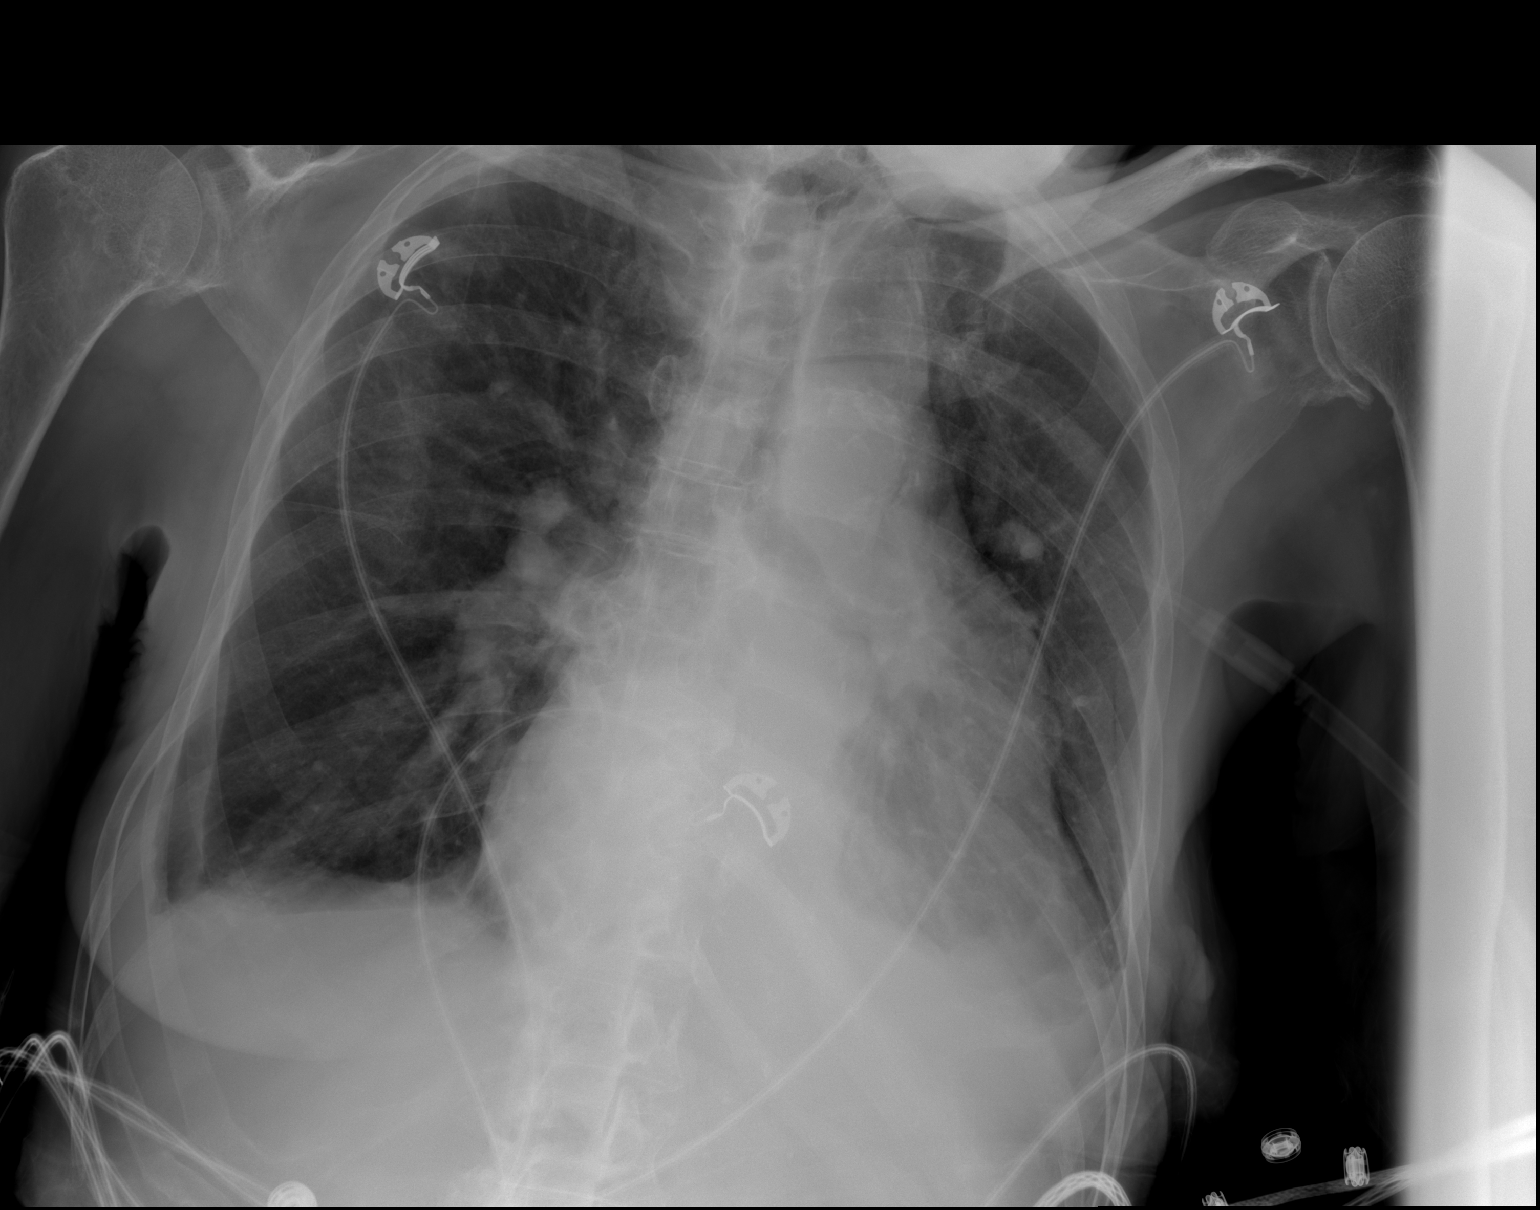

[1 of 1 positions shown; findings below may reference images not displayed]

FINDINGS: Portable AP upright view at substantially regressed veiling opacity
at the right lung base with improved ventilation since yesterday. No
pneumothorax identified. Stable smaller veiling opacity at the left
lung base compatible with small left effusion. Cardiomegaly and
mediastinal contours are stable. Calcified aortic atherosclerosis.
No pulmonary edema or areas of worsening ventilation.
IMPRESSION: 1. Improved right lung base ventilation and no pneumothorax
following right side thoracentesis.
2. Stable small left pleural effusion. No new cardiopulmonary
abnormality.

## 2020-02-07 IMAGING — US IR THORACENTESIS ASP PLEURAL SPACE W/IMG GUIDE
1 series · 3 of 3 positions shown · non-contrast
Comparison: none

INDICATION: Congestive heart failure. Right pleural effusion. Request for
therapeutic thoracentesis.

[Series 1: ir (id) (id)/(id)/(id) ir · 3 of 3 slices shown]
[im 1/3]
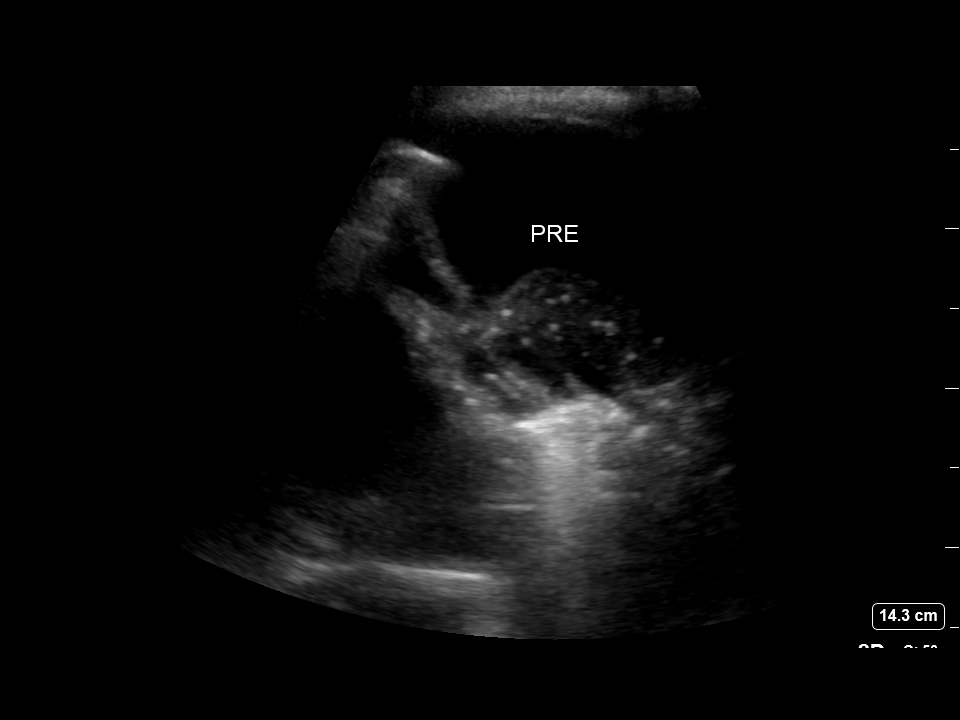
[im 2/3]
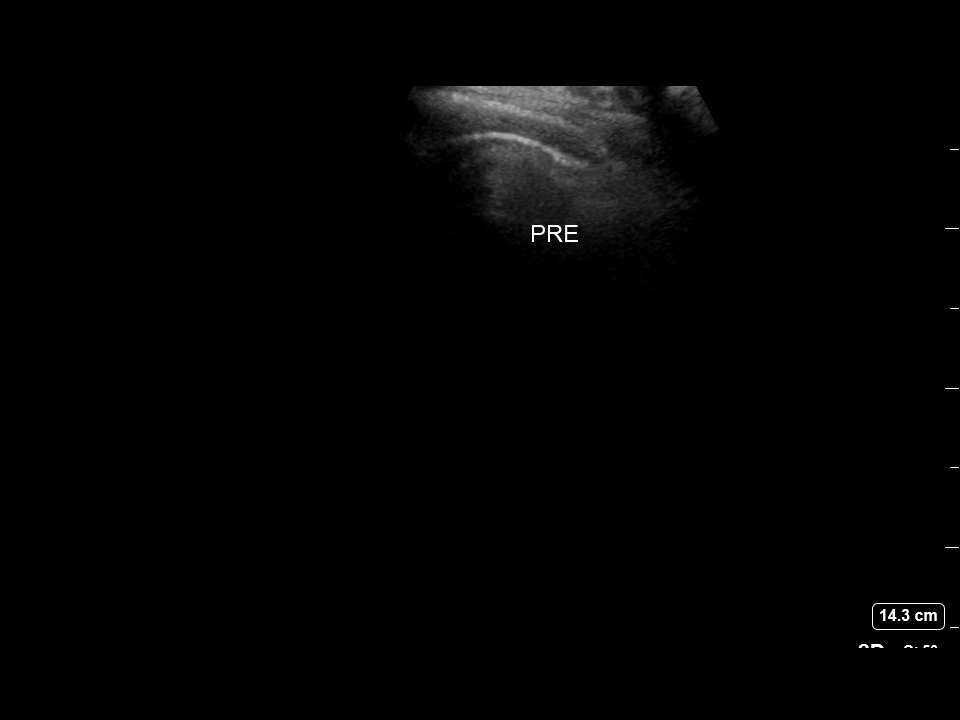
[im 3/3]
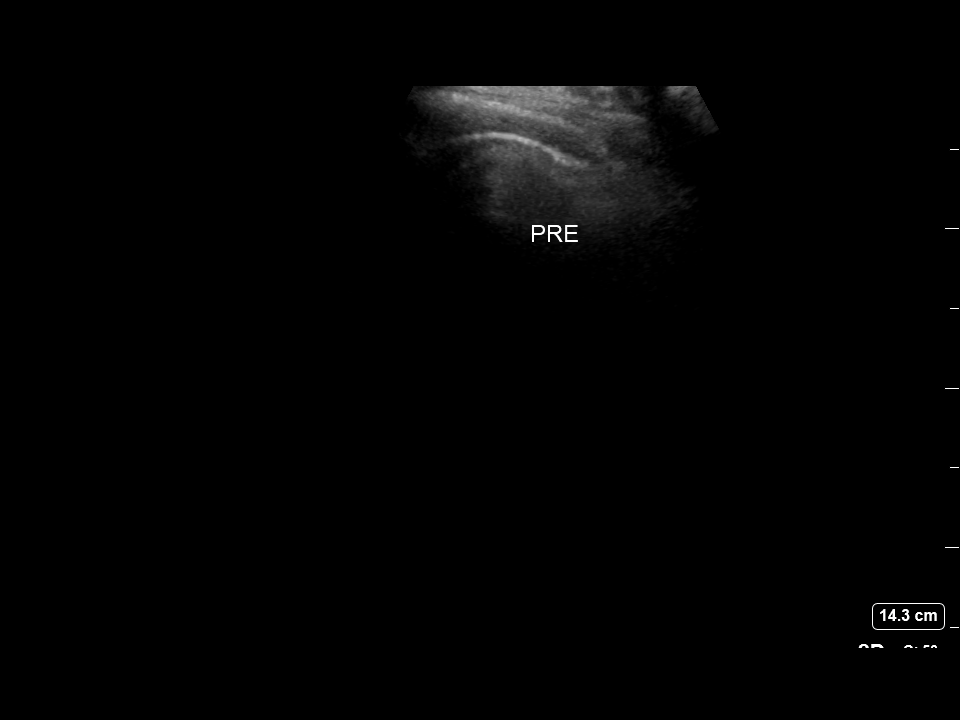

[3 of 3 positions shown; findings below may reference images not displayed]

EXAM:
ULTRASOUND GUIDED RIGHT THORACENTESIS

MEDICATIONS:
2% lidocaine 10 mL

COMPLICATIONS:
None immediate.

PROCEDURE:
An ultrasound guided thoracentesis was thoroughly discussed with the
patient and questions answered. The benefits, risks, alternatives
and complications were also discussed. The patient understands and
wishes to proceed with the procedure. Written consent was obtained.

Ultrasound was performed to localize and mark an adequate pocket of
fluid in the right chest. The area was then prepped and draped in
the normal sterile fashion. 1% Lidocaine was used for local
anesthesia. Under ultrasound guidance a 19 gauge, 7-cm, Yueh
catheter was introduced. Thoracentesis was performed. The catheter
was removed and a dressing applied.
FINDINGS: A total of approximately 1.3 L of bloody fluid was removed.
IMPRESSION: Successful ultrasound guided right thoracentesis yielding 1.3 L of
pleural fluid.

No pneumothorax on post-procedure chest x-ray.

## 2020-03-07 IMAGING — CR DG CHEST 2V
2 series · 2 of 2 positions shown · non-contrast
Comparison: 03/19/2018.

CLINICAL DATA: Shortness of breath.  CHF.

EXAM:
CHEST - 2 VIEW

[chest lat]
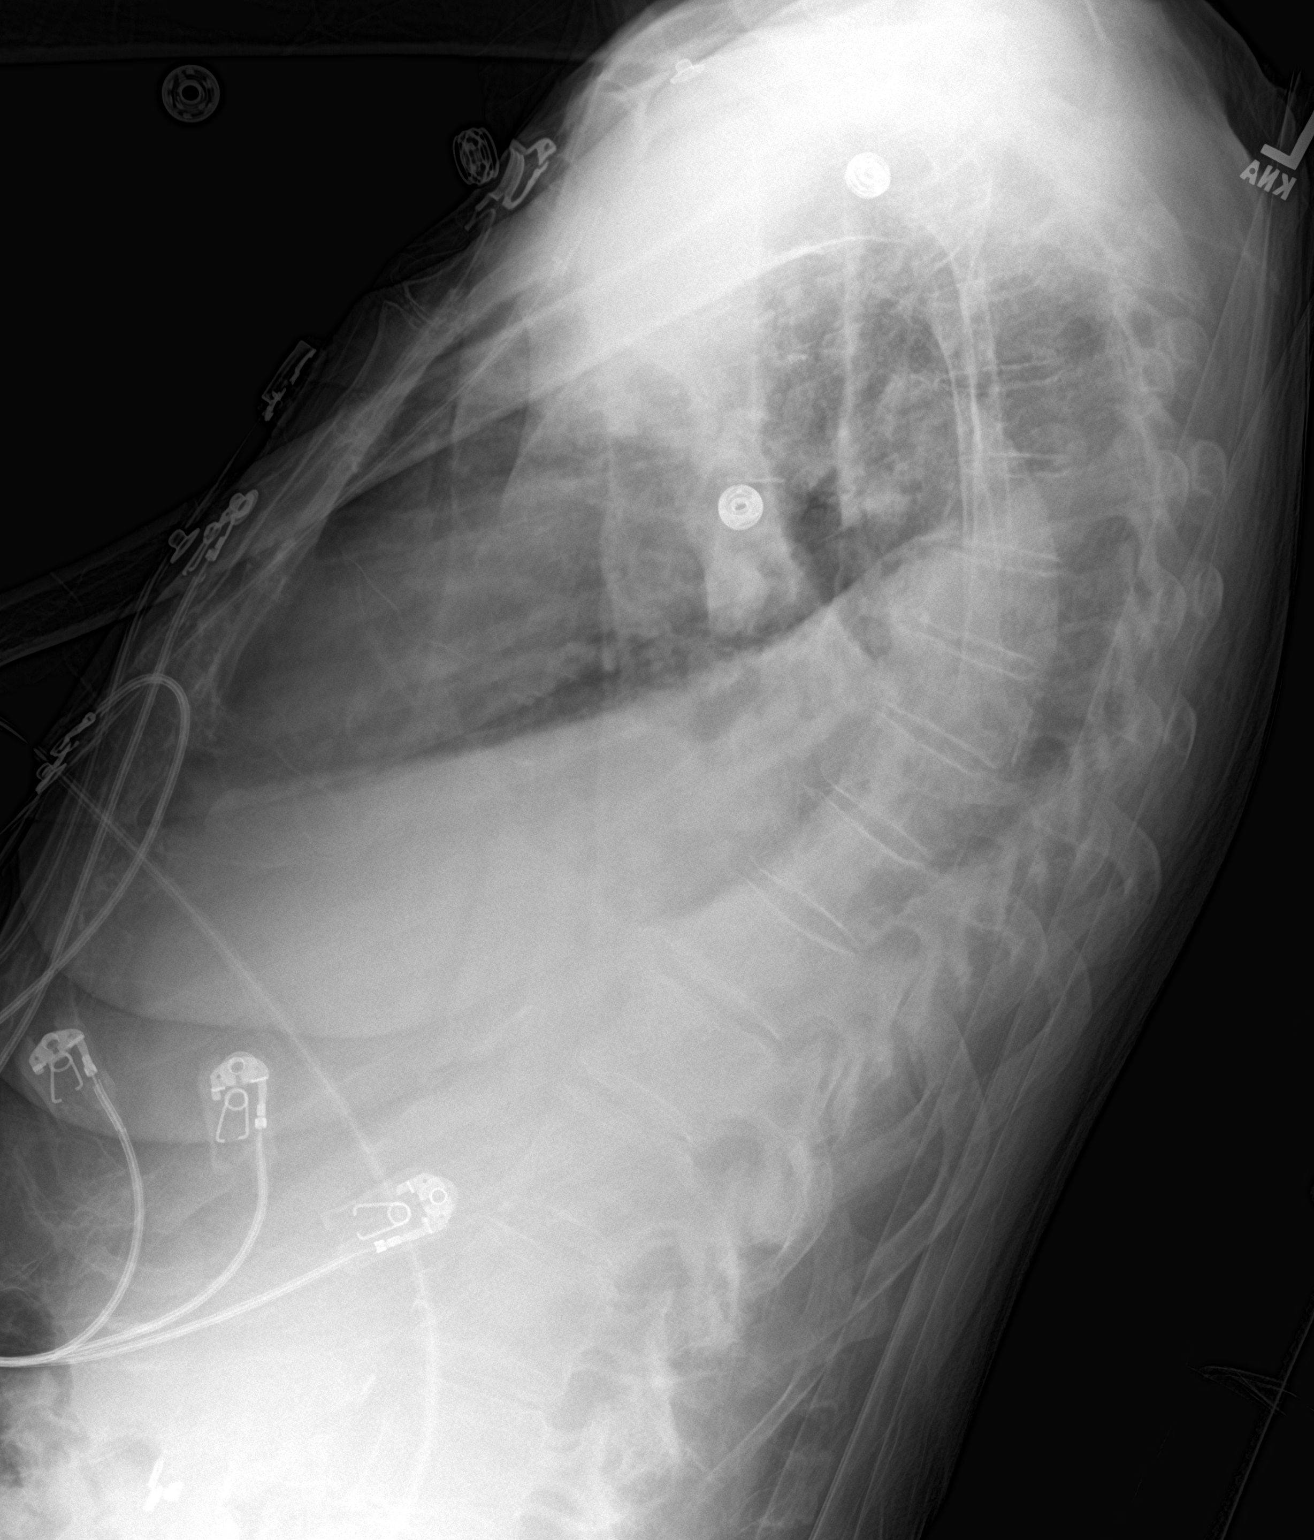

[chest ap]
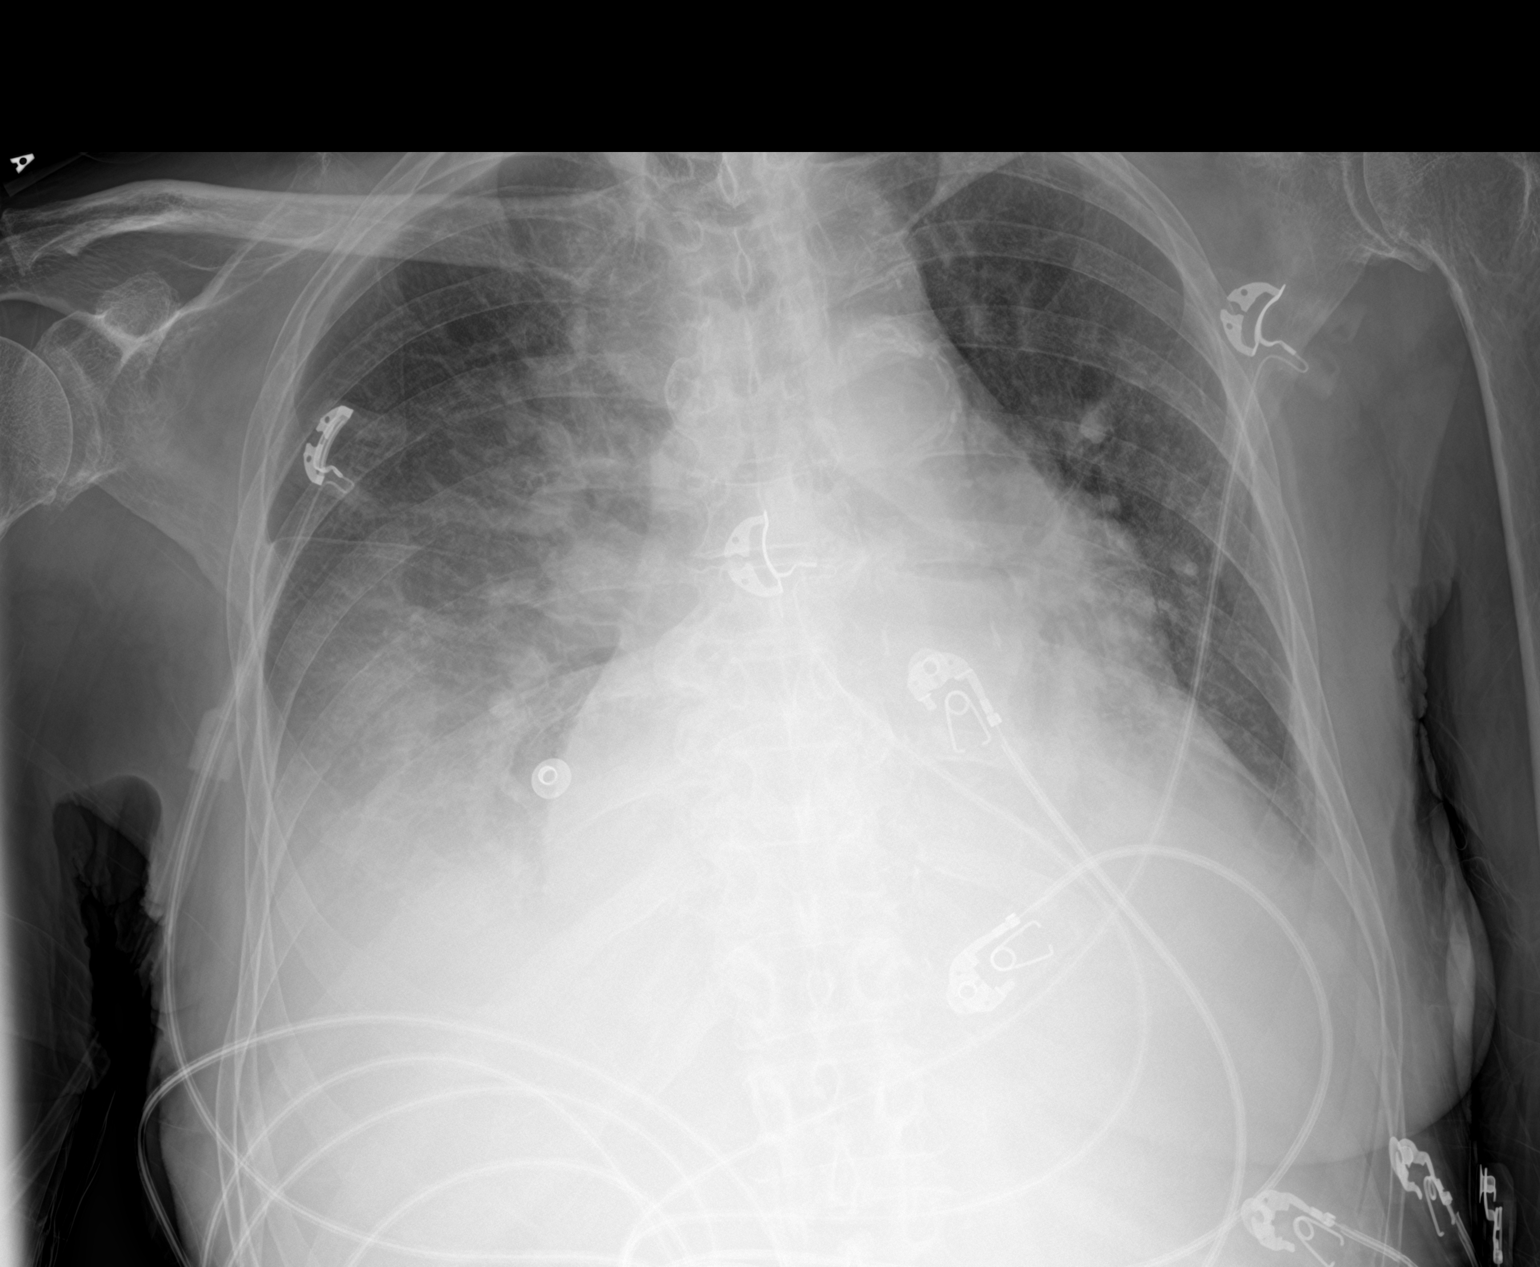

[2 of 2 positions shown; findings below may reference images not displayed]

FINDINGS: Cardiomegaly with diffuse prominent bilateral pulmonary
infiltrates/edema and bilateral pleural effusions. Findings
consistent congestive heart failure. Bilateral moaning cannot be
excluded.
IMPRESSION: Findings consistent congestive heart failure with prominent
bilateral pulmonary edema and bilateral pleural effusions. Bilateral
pneumonia cannot be excluded.

## 2020-03-11 IMAGING — DX DG CHEST 2V
2 series · 2 of 2 positions shown · non-contrast
Comparison: 04/17/2018

CLINICAL DATA: Shortness of breath

EXAM:
CHEST - 2 VIEW

[x chest ap]
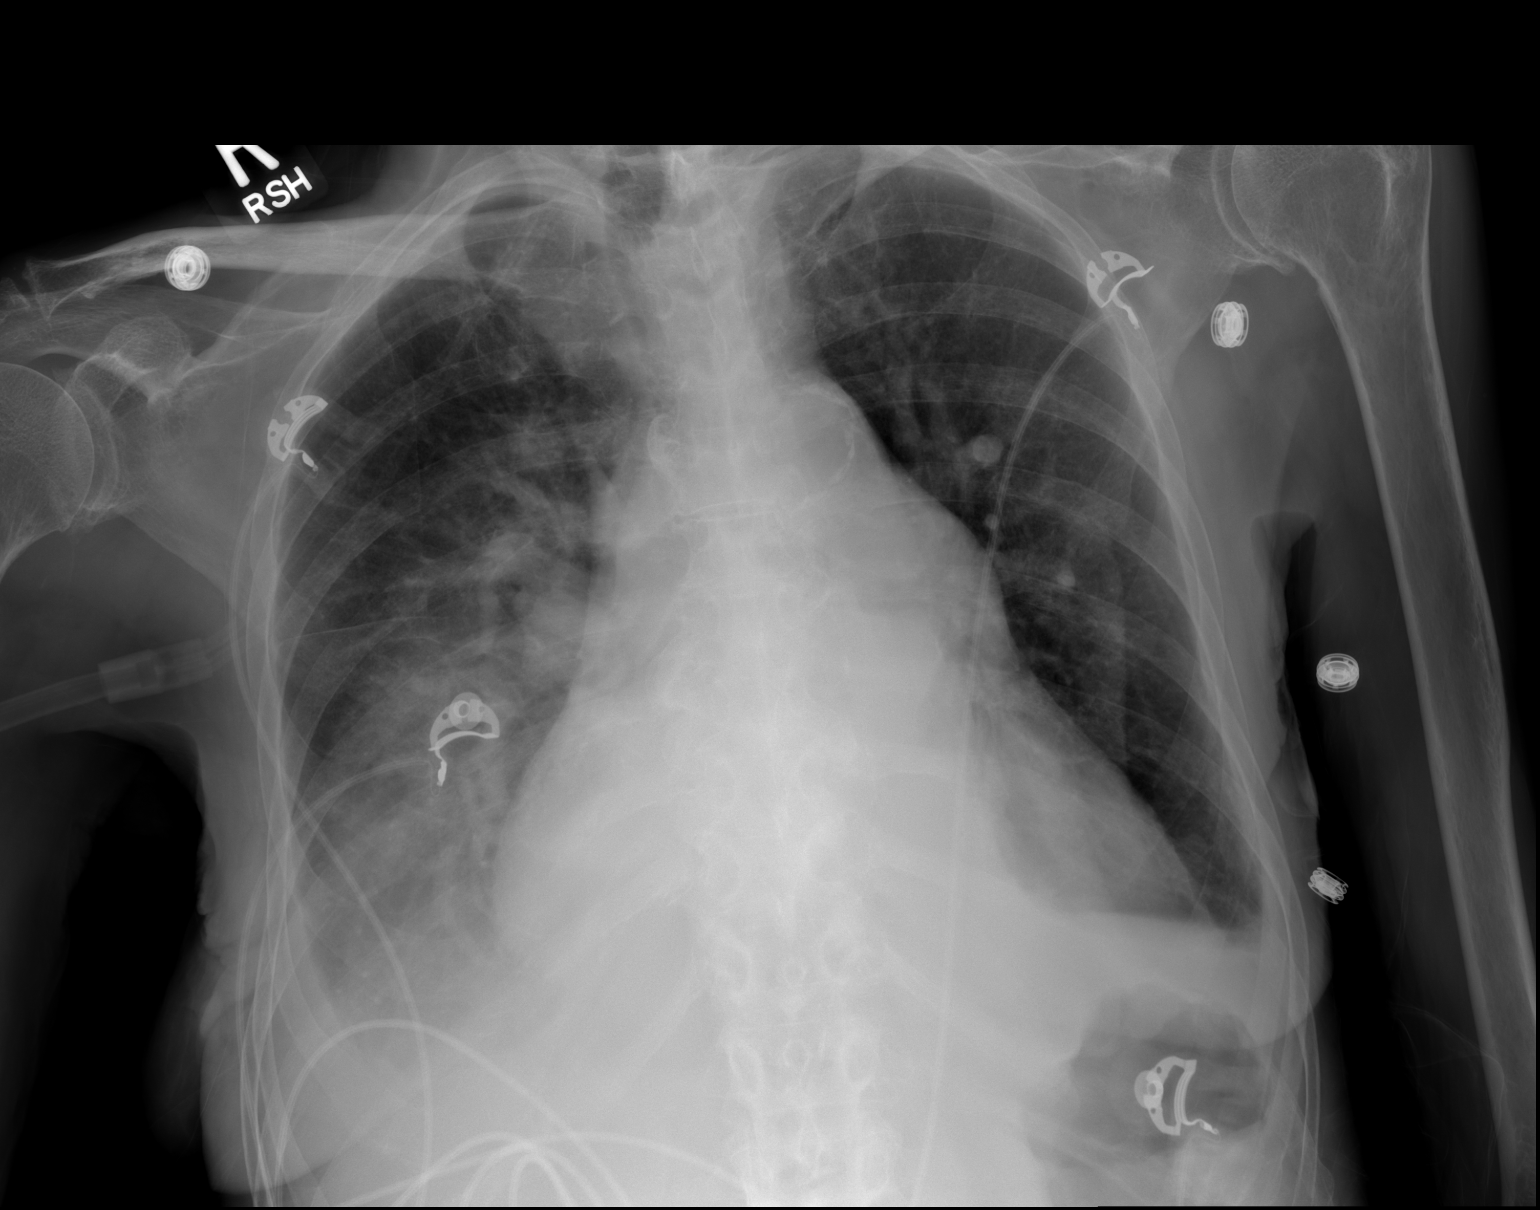

[w chest lat]
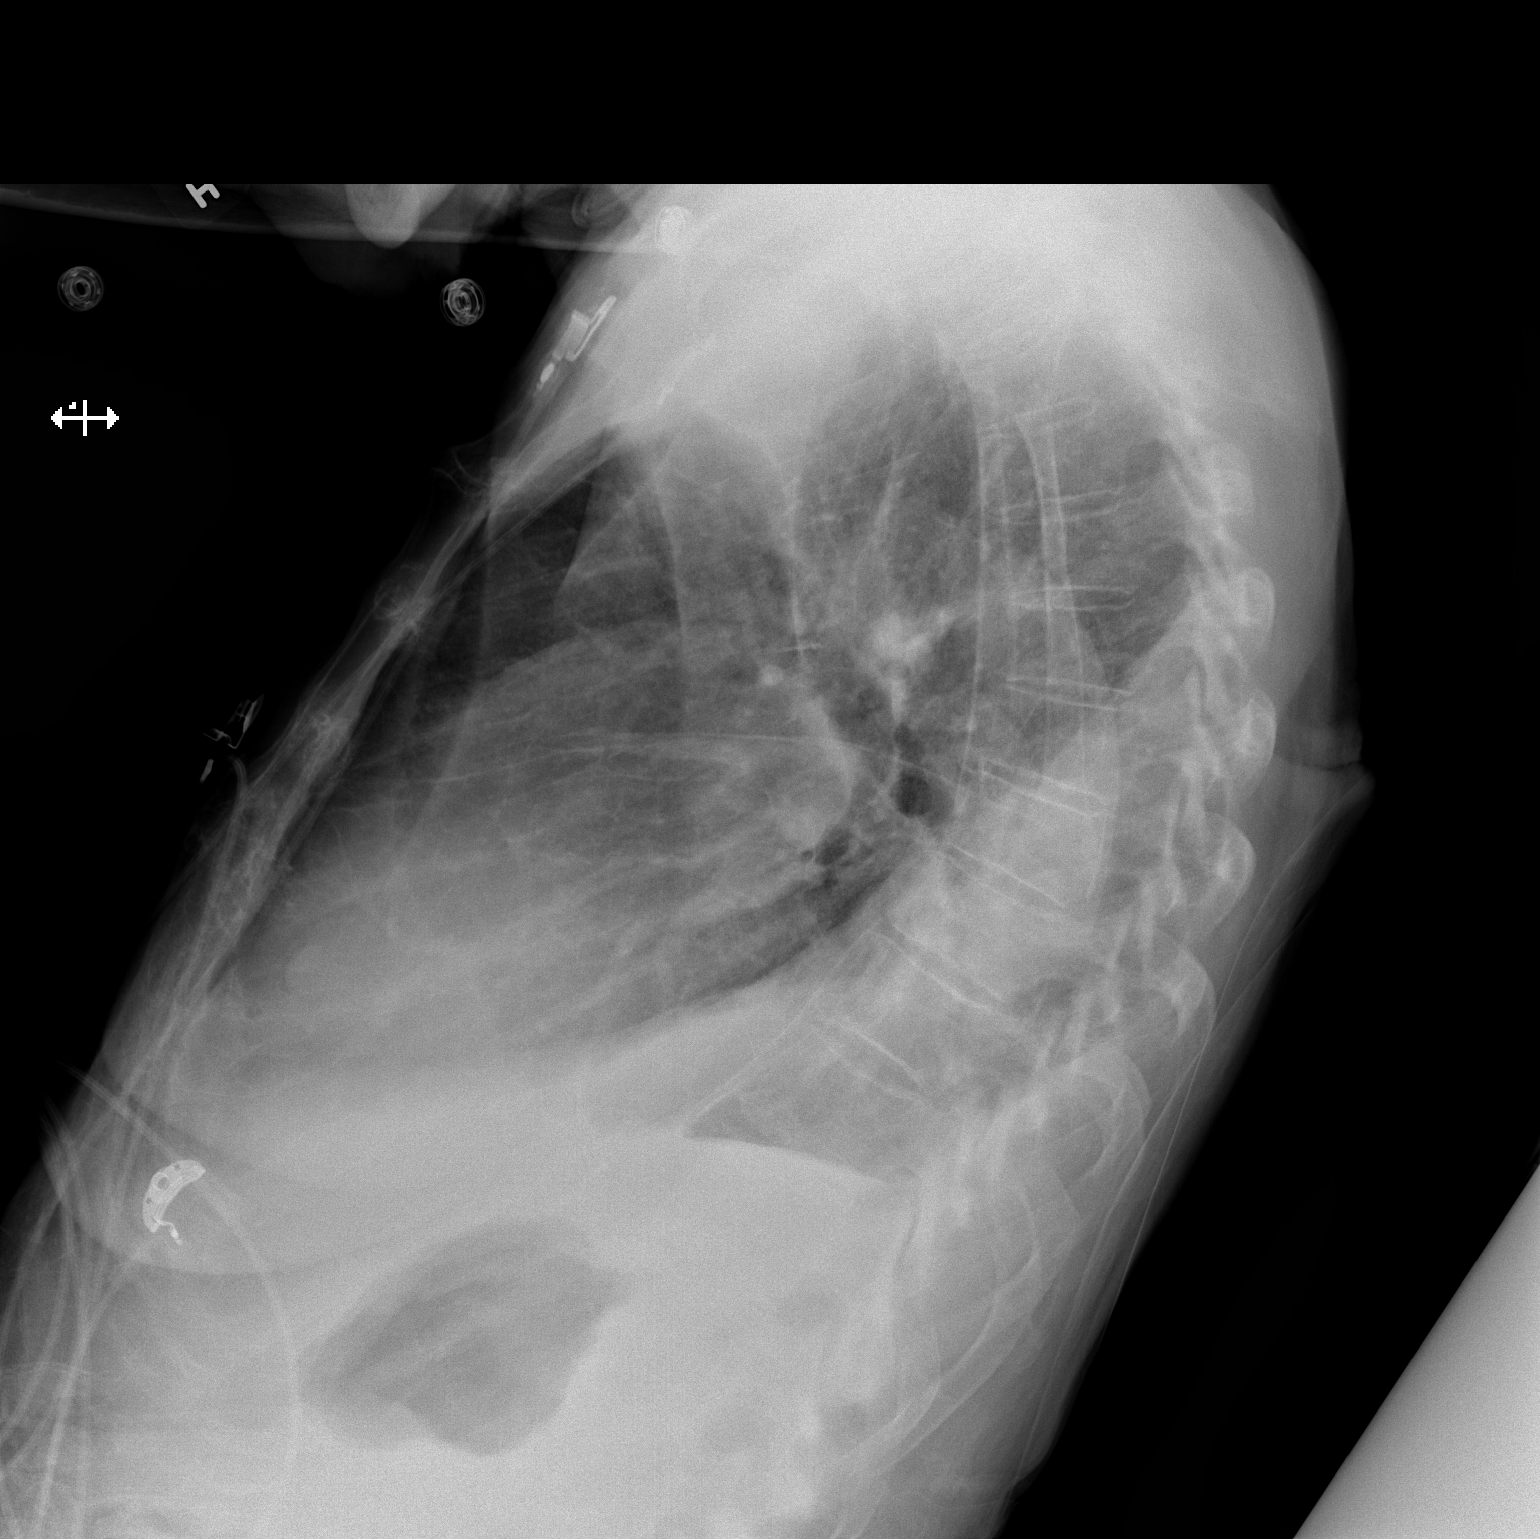

[2 of 2 positions shown; findings below may reference images not displayed]

FINDINGS: The patient is rotated to the right on today's radiograph, reducing
diagnostic sensitivity and specificity. Moderate enlargement of the
cardiopericardial silhouette, similar to prior. Atherosclerotic
calcification of the aortic arch. Continued airspace opacity
obscuring the right hemidiaphragm, primarily in the right lower
lobe, with some volume loss and likely underlying moderate to large
right pleural effusion. Mildly improved aeration at the left lung
base. Blunted right and possibly left costophrenic angles. Prominent
main pulmonary artery contour.

Degenerative glenohumeral arthropathy bilaterally.
IMPRESSION: 1. Roughly similar moderate to large right pleural effusion with
passive atelectasis. Small left pleural effusion. Improved aeration
at the left lung base.
2. Moderate cardiomegaly.
3.  Aortic Atherosclerosis ([id number]).
4. Enlarged pulmonary artery, potentially reflecting pulmonary
arterial hypertension.

## 2020-03-12 IMAGING — US IR THORACENTESIS ASP PLEURAL SPACE W/IMG GUIDE
1 series · 1 of 1 positions shown · non-contrast
Comparison: none

INDICATION: Patient with history of heart failure, colon cancer, chronic kidney
disease, dementia, malnutrition, recurrent right pleural effusion;
request received for diagnostic and therapeutic right thoracentesis.

[Series 1: ir (id) (id)/(id)/(id) ir · 1 of 1 slices shown]
[im 1/1]
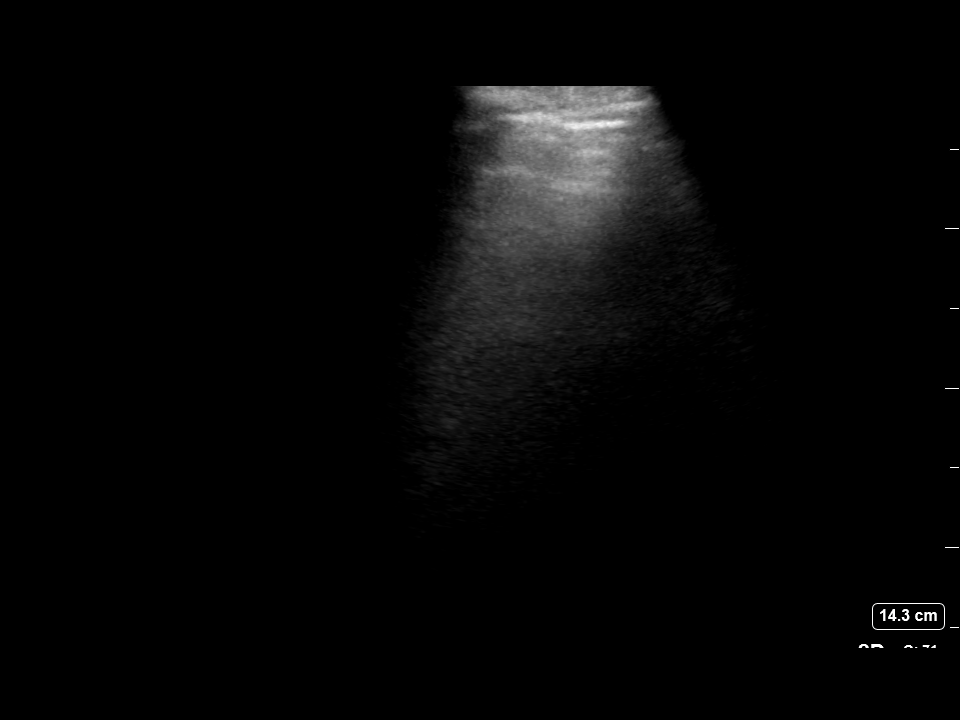

[1 of 1 positions shown; findings below may reference images not displayed]

EXAM:
ULTRASOUND GUIDED DIAGNOSTIC AND THERAPEUTIC RIGHT THORACENTESIS

MEDICATIONS:
None

COMPLICATIONS:
SIR Level A - No therapy, no consequence.

Procedure complicated by development of a small asymptomatic
presumably ex vacuo right-sided pneumothorax

PROCEDURE:
An ultrasound guided thoracentesis was thoroughly discussed with the
patient/family and questions answered. The benefits, risks,
alternatives and complications were also discussed. The patient
understands and wishes to proceed with the procedure. Written
consent was obtained.

Ultrasound was performed to localize and mark an adequate pocket of
fluid in the right chest. The area was then prepped and draped in
the normal sterile fashion. 1% Lidocaine was used for local
anesthesia. Under ultrasound guidance a 6 Fr Safe-T-Centesis
catheter was introduced. Thoracentesis was performed. The catheter
was removed and a dressing applied.
FINDINGS: A total of approximately 620 cc of blood-tinged/amber fluid was
removed. Samples were sent to the laboratory as requested by the
clinical team.
IMPRESSION: Successful ultrasound guided diagnostic and therapeutic right
thoracentesis yielding 620 cc of pleural fluid.

## 2020-03-12 IMAGING — DX DG CHEST 1V
1 series · 1 of 1 positions shown · non-contrast
Comparison: 04/21/2018;

CLINICAL DATA: Post right-sided thoracentesis.

EXAM:
CHEST  1 VIEW

[chest ap]
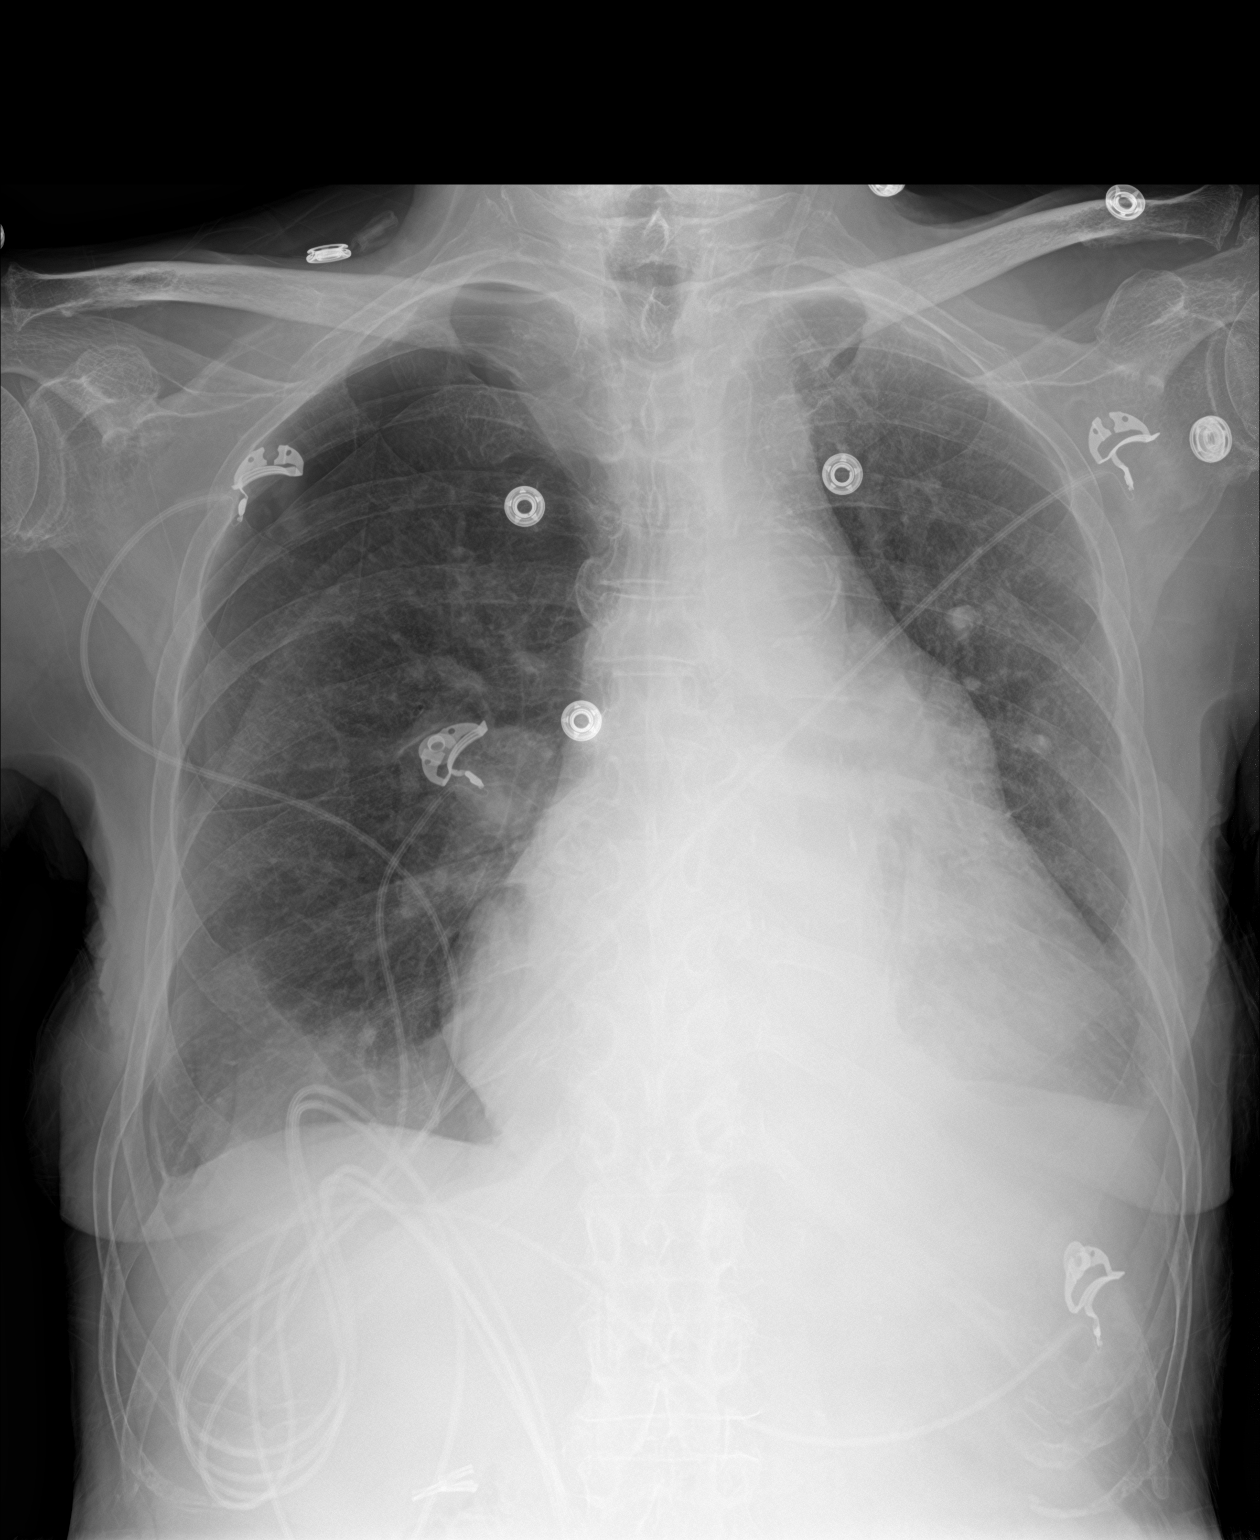

[1 of 1 positions shown; findings below may reference images not displayed]

04/17/2018; ultrasound-guided right-sided
thoracentesis - 03/19/2018; chest CT - 02/09/2018
FINDINGS: Grossly unchanged enlarged cardiac silhouette and mediastinal
contours with prominence of the main pulmonary artery.
Atherosclerotic plaque within the thoracic aorta.

Interval reduction/resolution of right-sided pleural effusion post
thoracentesis with development of a small presumably ex vacuo
right-sided pneumothorax. No mediastinal shift. Pulmonary
vasculature remains indistinct with cephalization of flow. Unchanged
trace left-sided pleural effusion. Improved aeration of lung bases
with persistent bibasilar opacities, likely atelectasis. No new
focal airspace opacities. No acute osseous abnormalities. Post
cholecystectomy.
IMPRESSION: 1. Interval reduction/resolution of right-sided pneumothorax post
thoracentesis with development of a small presumably ex vacuo
pneumothorax. Continued attention on follow-up is advised.
2. Improved aeration of lung bases with persistent bibasilar
opacities, likely atelectasis.
3. Similar findings cardiomegaly, pulmonary edema and trace
left-sided pleural effusion.

## 2020-03-13 IMAGING — CR DG CHEST 1V PORT
1 series · 1 of 1 positions shown · non-contrast
Comparison: Portable chest x-ray of 04/22/2018 and 04/21/2017

CLINICAL DATA: Shortness of breath

EXAM:
PORTABLE CHEST 1 VIEW

[AP]
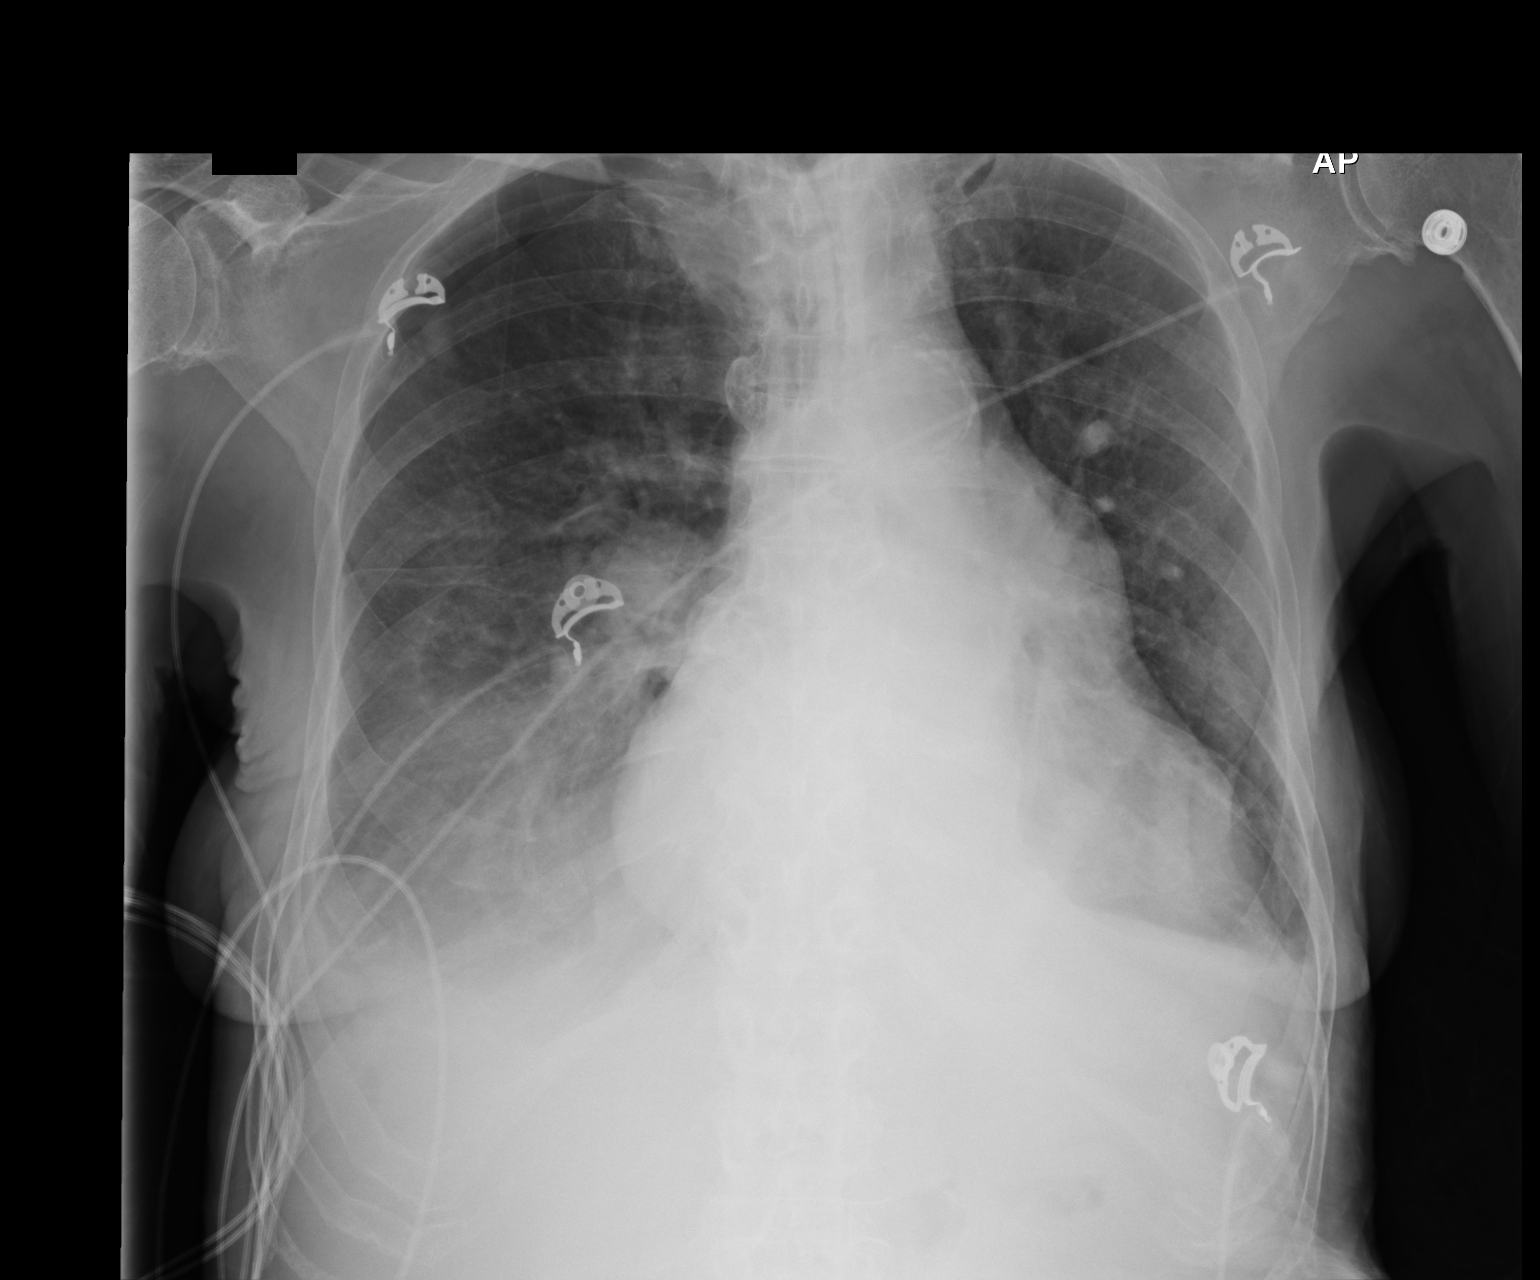

[1 of 1 positions shown; findings below may reference images not displayed]

FINDINGS: There has been slight decrease in volume of the right pneumothorax.
Aeration has decreased slightly. Bilateral pleural effusions remain
with bibasilar volume loss right greater than left. Cardiomegaly is
stable.
IMPRESSION: 1. Slight decrease in volume of small right apical pneumothorax.
2. No change in bilateral pleural effusions with bibasilar volume
loss right greater than left. Stable cardiomegaly

## 2020-03-14 IMAGING — DX DG CHEST 1V PORT
1 series · 1 of 1 positions shown · non-contrast
Comparison: 04/23/2018

CLINICAL DATA: Short of breath.  Pneumothorax.

EXAM:
PORTABLE CHEST 1 VIEW

[chest]
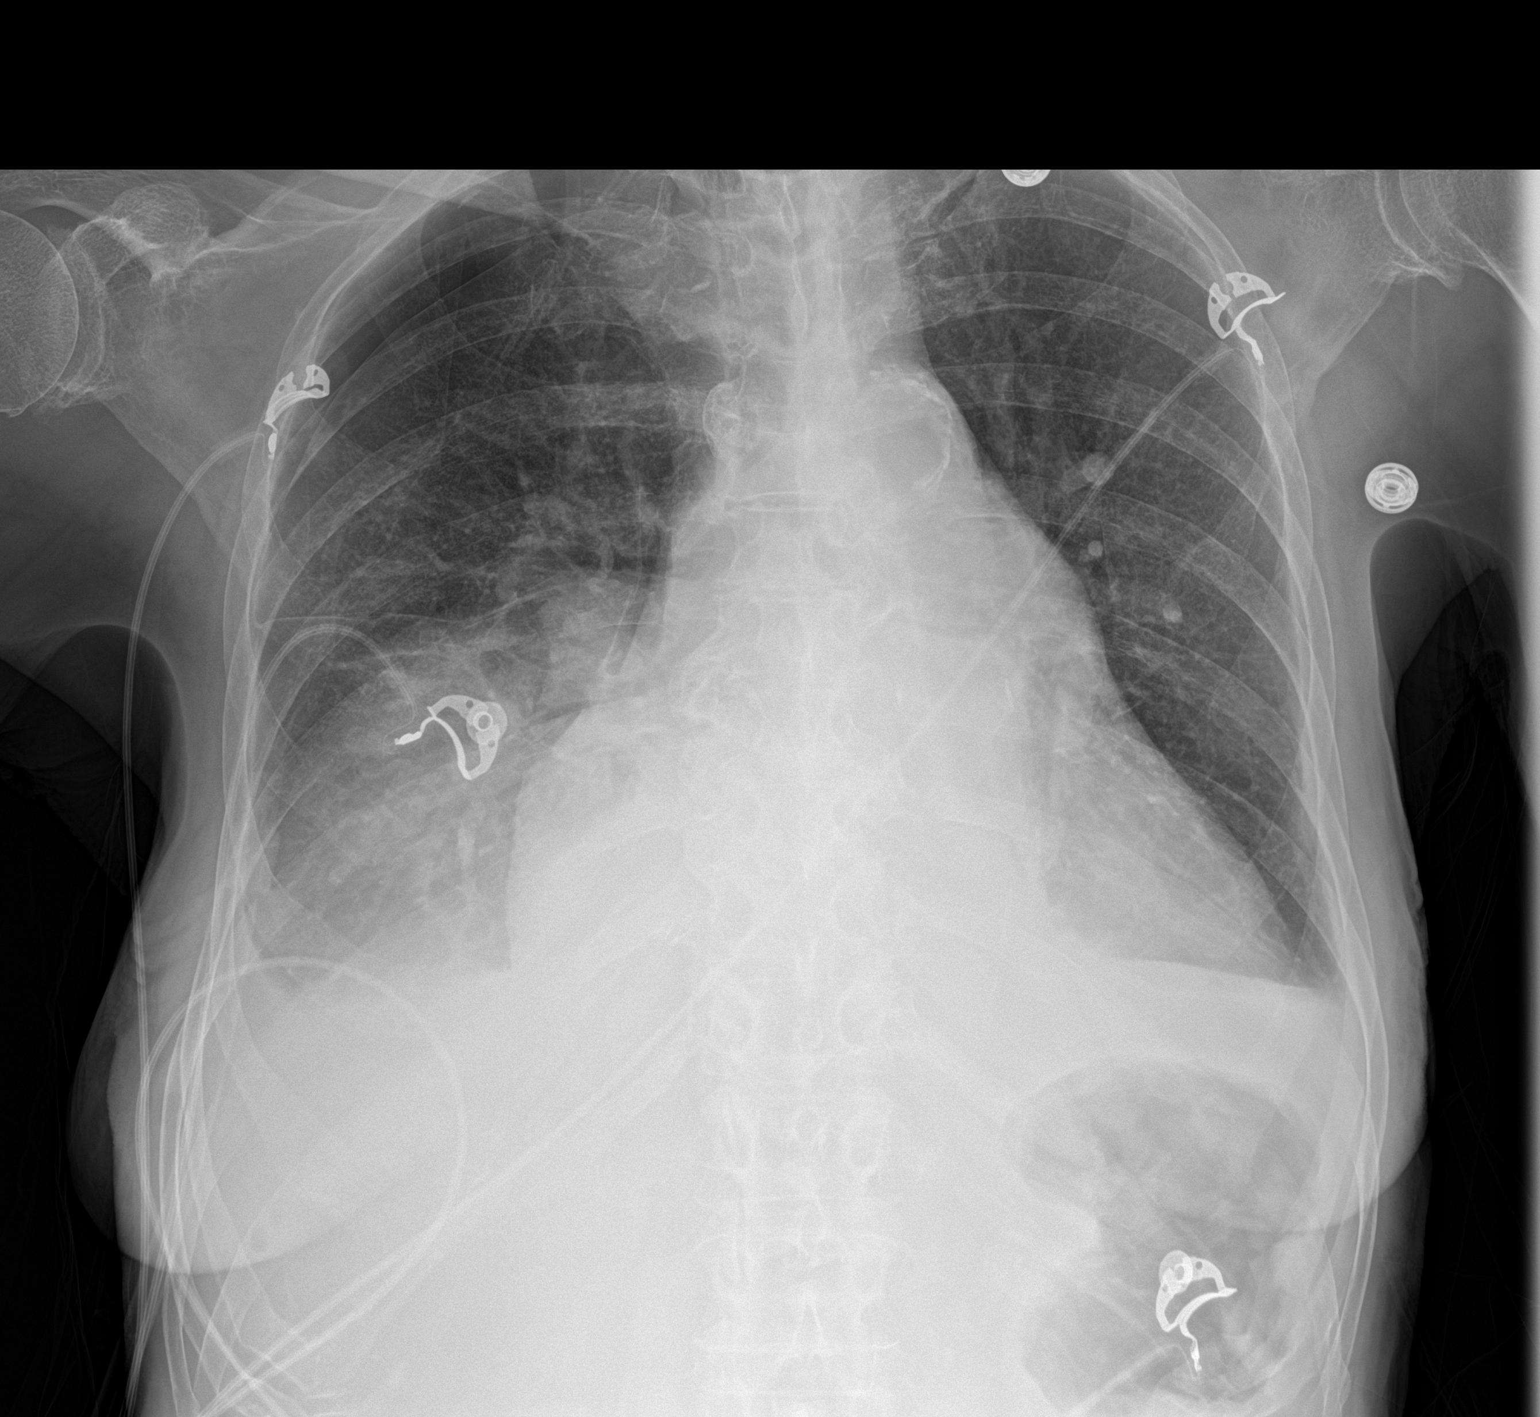

[1 of 1 positions shown; findings below may reference images not displayed]

FINDINGS: Progression of right apical pneumothorax now approximately 2.5 cm.
Progression of right pleural effusion and right lower lobe
atelectasis

Small left effusion and mild left lower lobe atelectasis. Cardiac
enlargement with mild vascular congestion. Negative for edema.
Atherosclerotic aortic arch.
IMPRESSION: Progressive right pneumothorax. Mild progression of right lower lobe
atelectasis and effusion.
